# Patient Record
Sex: Female | Born: 1965 | Race: Black or African American | Hispanic: No | Marital: Single | Smoking: Never smoker
Health system: Southern US, Community
[De-identification: ages and names within clinical notes are randomized; demographics above are authoritative.]

## PROBLEM LIST (undated history)

## (undated) DIAGNOSIS — N3000 Acute cystitis without hematuria: Secondary | ICD-10-CM

## (undated) DIAGNOSIS — H65191 Other acute nonsuppurative otitis media, right ear: Secondary | ICD-10-CM

## (undated) DIAGNOSIS — Z930 Tracheostomy status: Secondary | ICD-10-CM

## (undated) DIAGNOSIS — I639 Cerebral infarction, unspecified: Secondary | ICD-10-CM

## (undated) DIAGNOSIS — H60393 Other infective otitis externa, bilateral: Secondary | ICD-10-CM

## (undated) DIAGNOSIS — I10 Essential (primary) hypertension: Secondary | ICD-10-CM

## (undated) DIAGNOSIS — R4182 Altered mental status, unspecified: Secondary | ICD-10-CM

## (undated) DIAGNOSIS — J9601 Acute respiratory failure with hypoxia: Secondary | ICD-10-CM

---

## 2000-01-12 ENCOUNTER — Other Ambulatory Visit: Admission: RE | Admit: 2000-01-12 | Discharge: 2000-01-12 | Payer: Self-pay | Admitting: Obstetrics and Gynecology

## 2000-08-11 ENCOUNTER — Inpatient Hospital Stay (HOSPITAL_COMMUNITY): Admission: AD | Admit: 2000-08-11 | Discharge: 2000-08-14 | Payer: Self-pay | Admitting: Obstetrics and Gynecology

## 2000-08-19 ENCOUNTER — Inpatient Hospital Stay (HOSPITAL_COMMUNITY): Admission: AD | Admit: 2000-08-19 | Discharge: 2000-08-19 | Payer: Self-pay | Admitting: *Deleted

## 2000-09-27 ENCOUNTER — Other Ambulatory Visit: Admission: RE | Admit: 2000-09-27 | Discharge: 2000-09-27 | Payer: Self-pay | Admitting: Obstetrics and Gynecology

## 2000-11-27 ENCOUNTER — Emergency Department (HOSPITAL_COMMUNITY): Admission: EM | Admit: 2000-11-27 | Discharge: 2000-11-27 | Payer: Self-pay | Admitting: Emergency Medicine

## 2000-11-27 ENCOUNTER — Encounter: Payer: Self-pay | Admitting: Emergency Medicine

## 2002-06-06 ENCOUNTER — Encounter: Payer: Self-pay | Admitting: Physical Medicine and Rehabilitation

## 2002-06-06 ENCOUNTER — Ambulatory Visit (HOSPITAL_COMMUNITY)
Admission: RE | Admit: 2002-06-06 | Discharge: 2002-06-06 | Payer: Self-pay | Admitting: Physical Medicine and Rehabilitation

## 2002-11-03 ENCOUNTER — Emergency Department (HOSPITAL_COMMUNITY): Admission: EM | Admit: 2002-11-03 | Discharge: 2002-11-03 | Payer: Self-pay | Admitting: Emergency Medicine

## 2002-11-08 ENCOUNTER — Emergency Department (HOSPITAL_COMMUNITY): Admission: EM | Admit: 2002-11-08 | Discharge: 2002-11-09 | Payer: Self-pay

## 2003-07-19 ENCOUNTER — Emergency Department (HOSPITAL_COMMUNITY): Admission: EM | Admit: 2003-07-19 | Discharge: 2003-07-19 | Payer: Self-pay | Admitting: Emergency Medicine

## 2004-05-16 ENCOUNTER — Inpatient Hospital Stay (HOSPITAL_COMMUNITY): Admission: AD | Admit: 2004-05-16 | Discharge: 2004-05-16 | Payer: Self-pay | Admitting: Obstetrics and Gynecology

## 2004-05-16 IMAGING — US US OB COMP LESS 14 WK
1 series · 12 of 12 positions shown · non-contrast
Comparison: none

CLINICAL DATA: 38-year-old female with positive pregnancy test and pelvic pain.

[Series 1: us ob comp less 14 wk · 0.29mm/px · 12 of 12 slices shown]
[im 1/12]
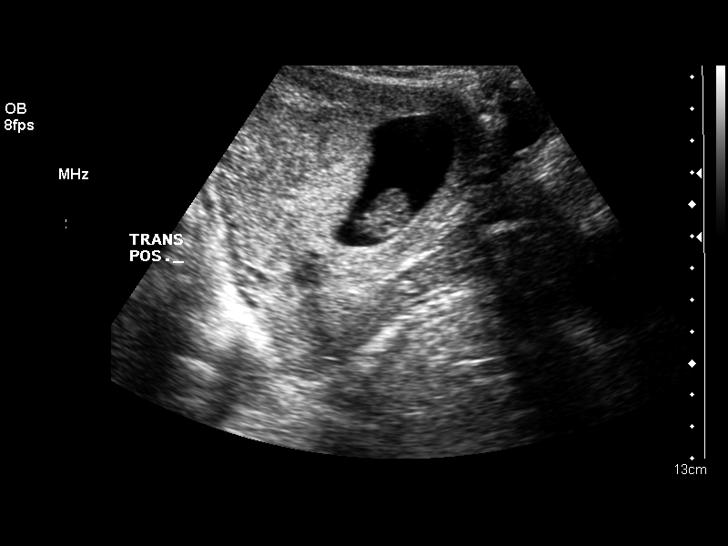
[im 2/12]
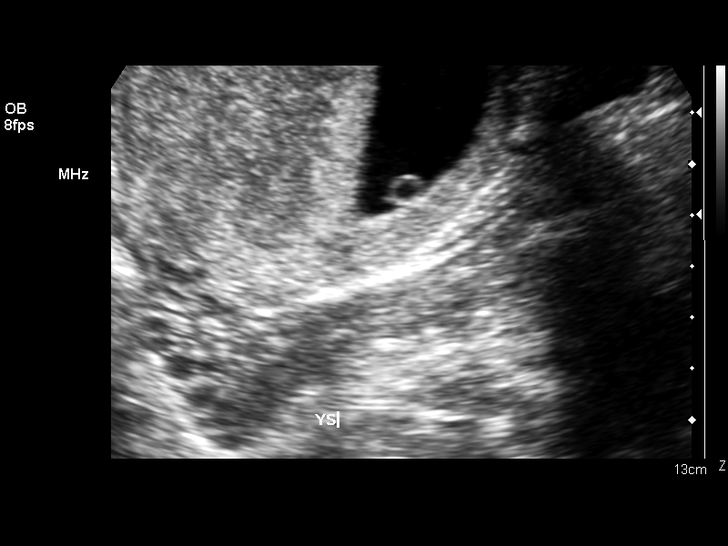
[im 3/12]
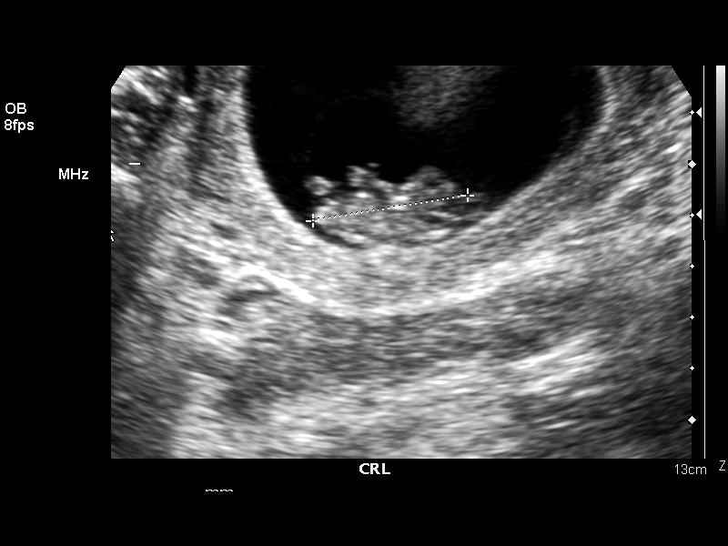
[im 4/12]
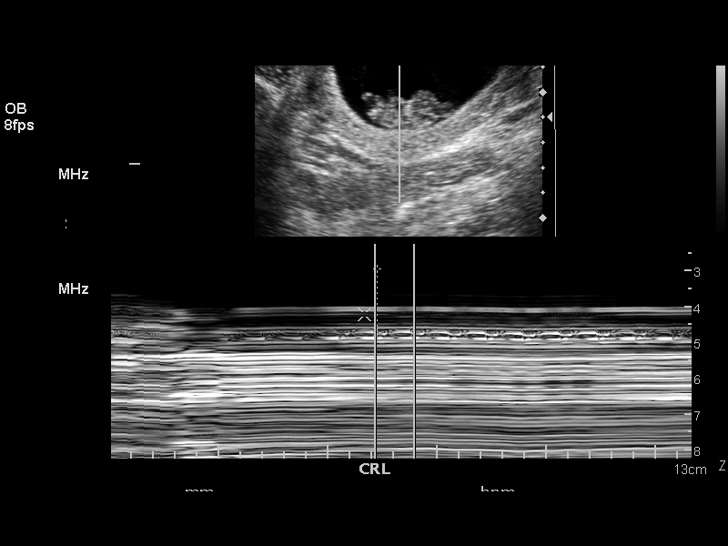
[im 5/12]
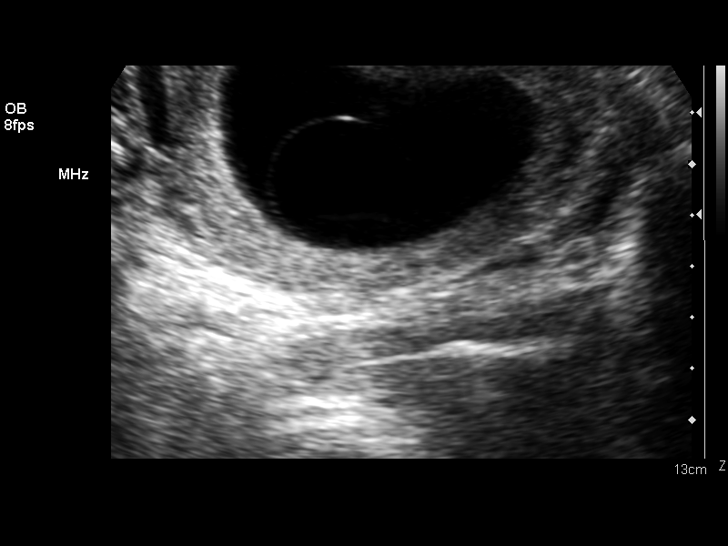
[im 6/12]
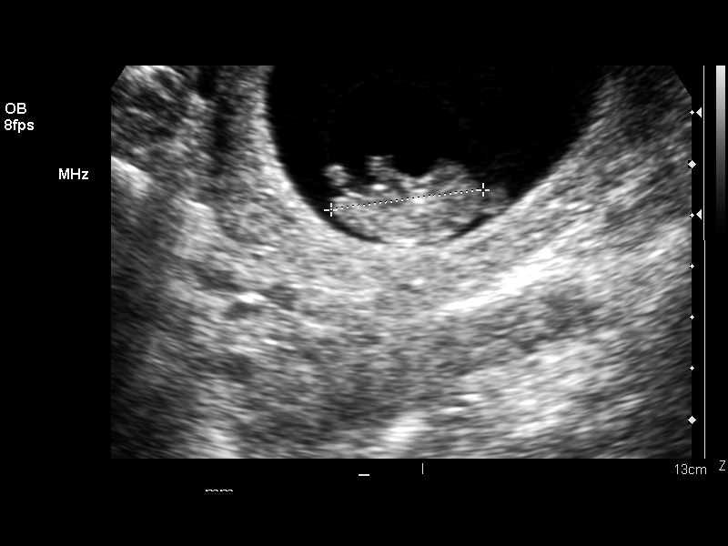
[im 7/12]
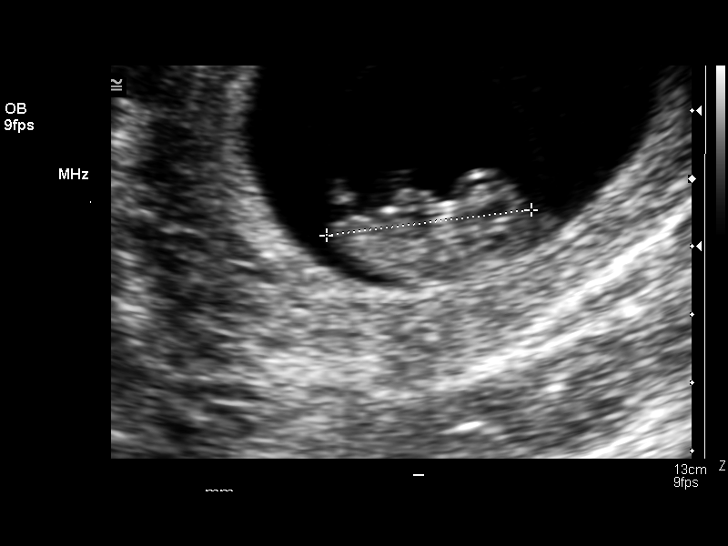
[im 8/12]
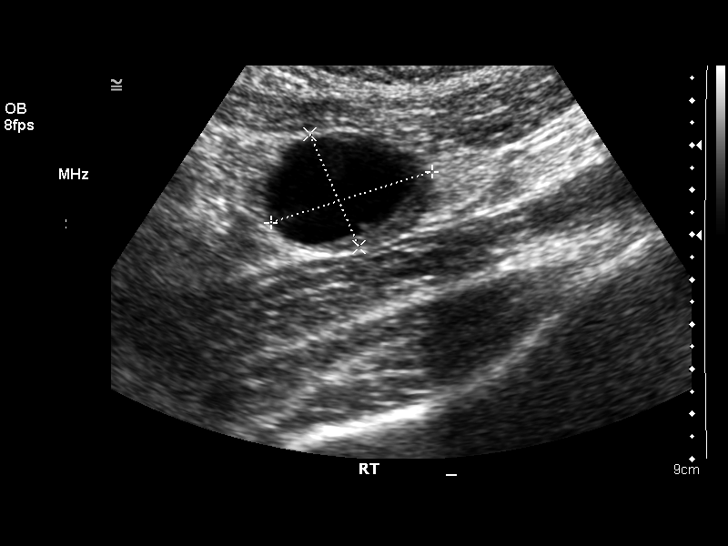
[im 9/12]
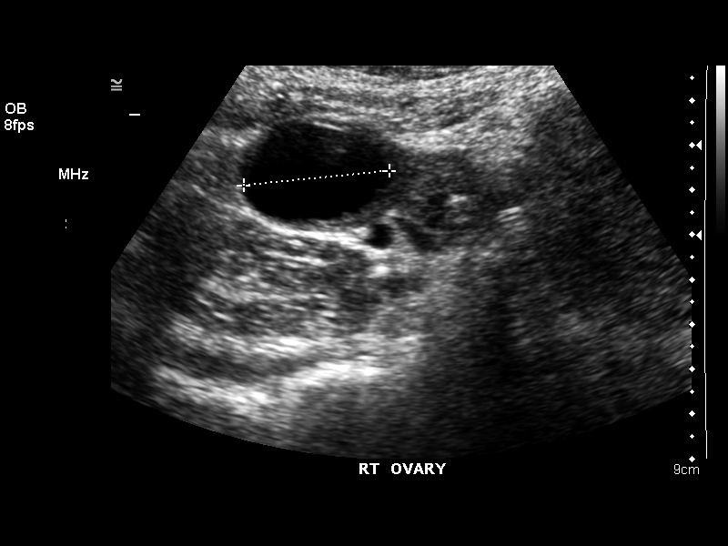
[im 10/12]
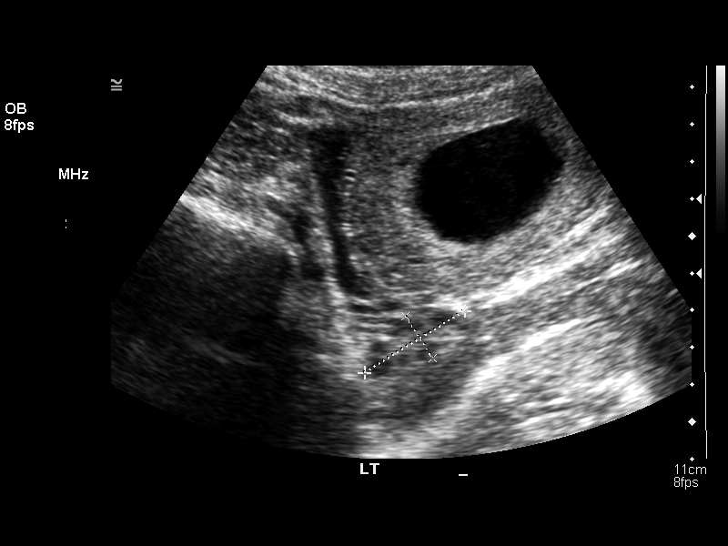
[im 11/12]
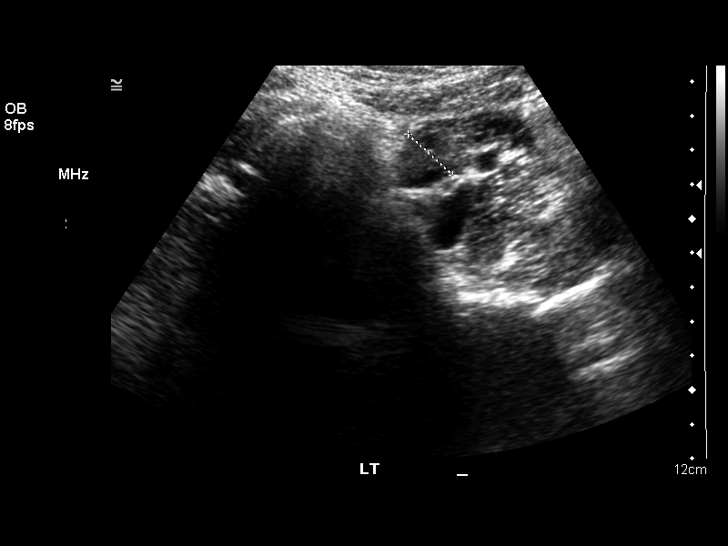
[im 12/12]
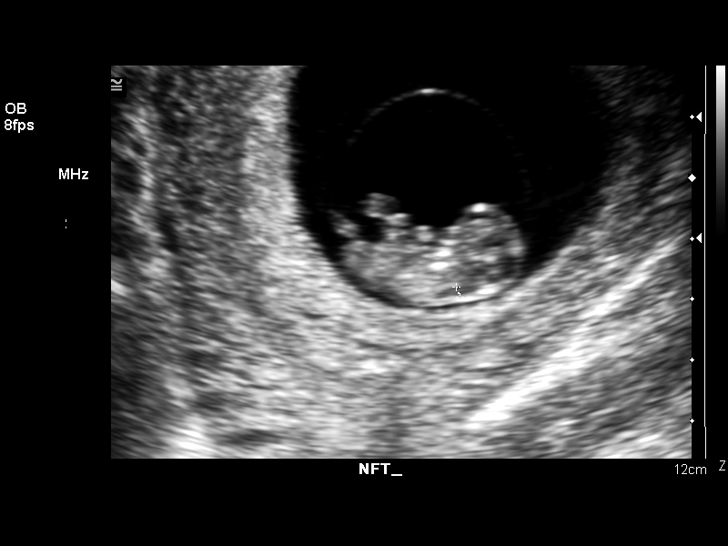

[12 of 12 positions shown; findings below may reference images not displayed]

OBSTETRICAL ULTRASOUND:

 Number of Fetuses:  1
 Heart Rate:  168
 Presentation:  Transverse
 Amniotic fluid:  Normal
 CRL:  3.0 cm  9 w 6 d
 Ultrasound EDC:  [DATE]

 Fetal anatomy could not be evaluated due to the early gestational age.  Yolk sac visualized.  

 MATERNAL FINDINGS
 Cervix not evaluated.  3.0 cm corpus luteum right ovary.
IMPRESSION: Single living intrauterine gestation with estimated gestational age of 9 weeks 6 days.  
 Right corpus luteum.
 No evidence of subchorionic hemorrhage.

## 2004-07-24 ENCOUNTER — Inpatient Hospital Stay (HOSPITAL_COMMUNITY): Admission: AD | Admit: 2004-07-24 | Discharge: 2004-07-24 | Payer: Self-pay | Admitting: Obstetrics and Gynecology

## 2004-07-24 IMAGING — US US OB COMP +14 WK
1 series · 18 of 28 positions shown · non-contrast
Comparison: none

CLINICAL DATA: Vaginal bleeding at 19 weeks and 5 days of pregnancy.

[Series 1: us ob comp +14 wk · 18 of 69 slices shown]
[im 1/69]
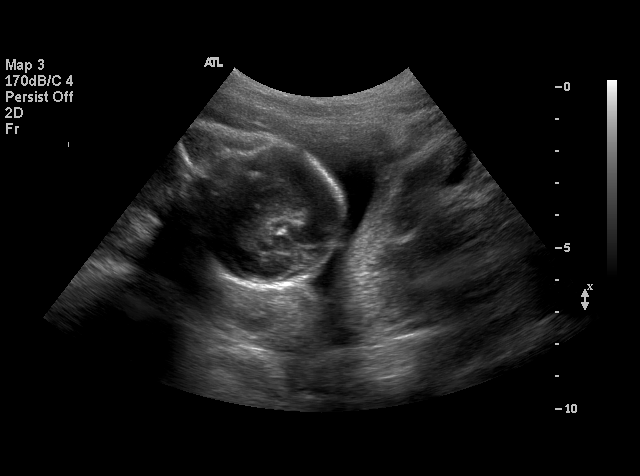
[im 6/69]
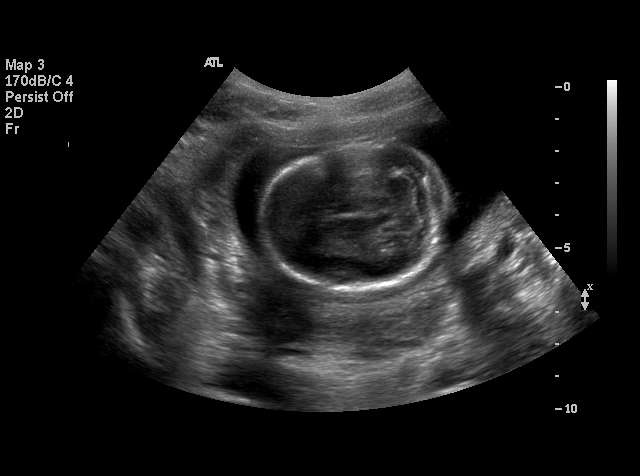
[im 8/69]
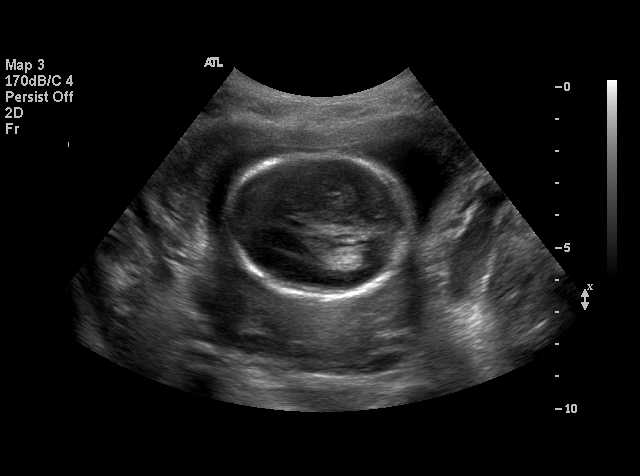
[im 13/69]
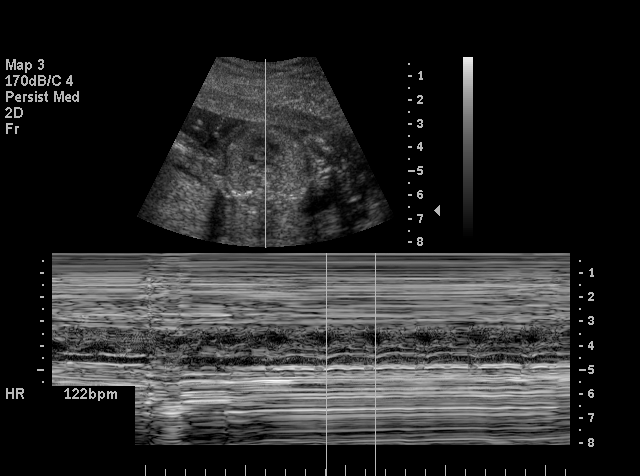
[im 18/69]
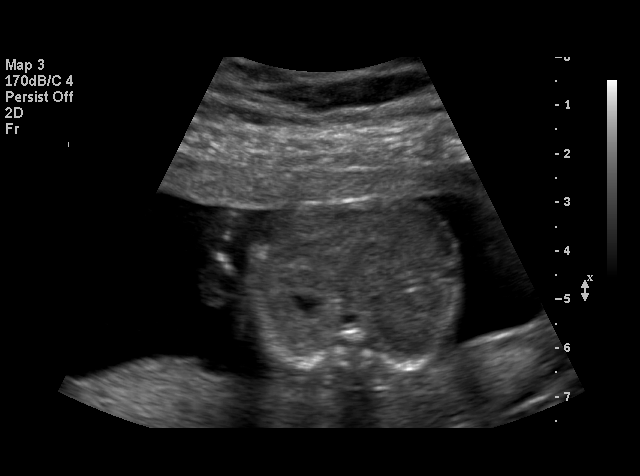
[im 21/69]
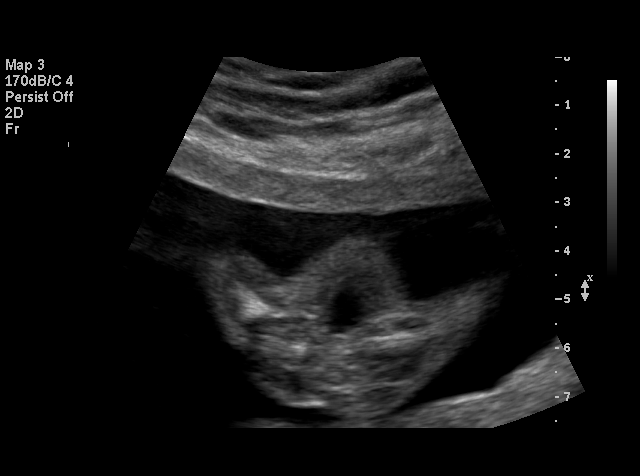
[im 26/69]
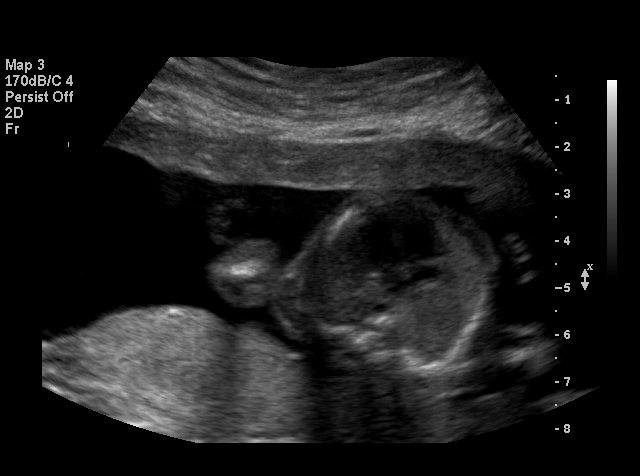
[im 28/69]
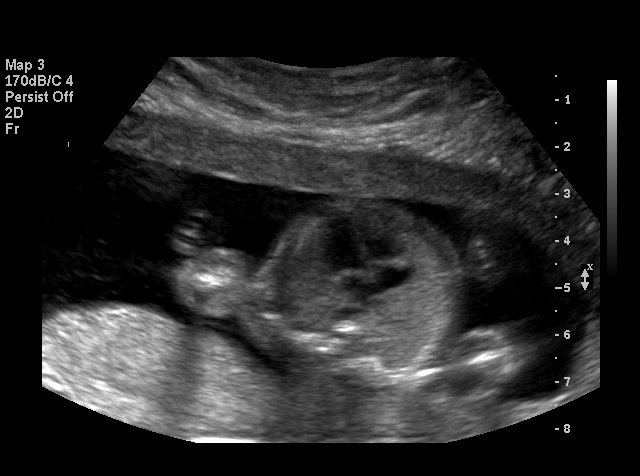
[im 33/69]
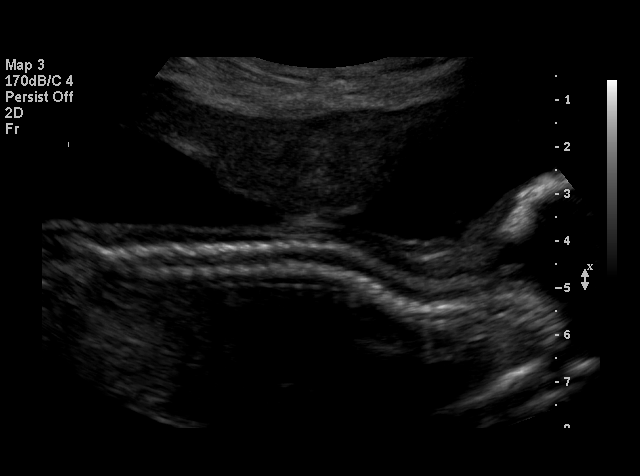
[im 36/69]
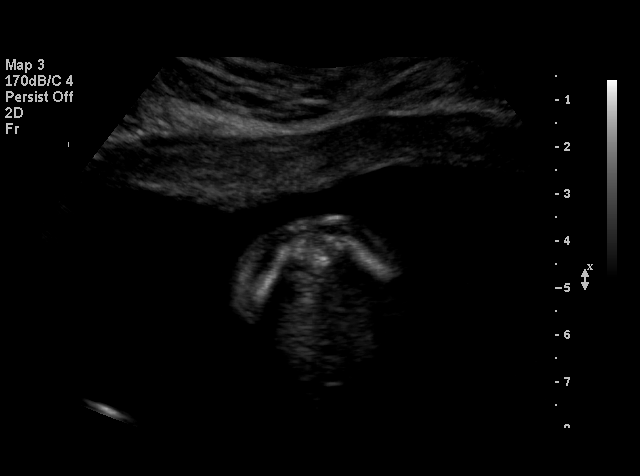
[im 41/69]
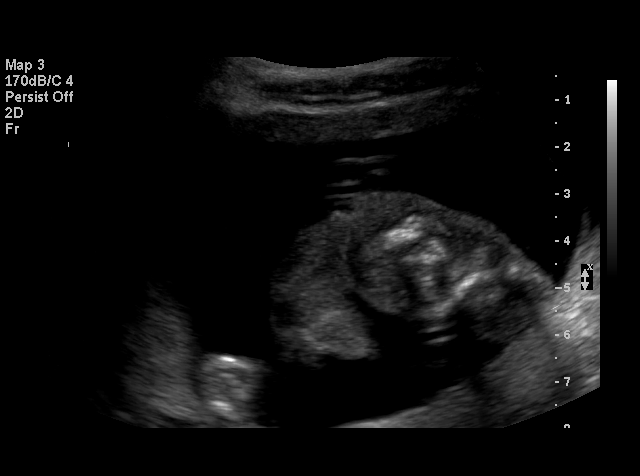
[im 43/69]
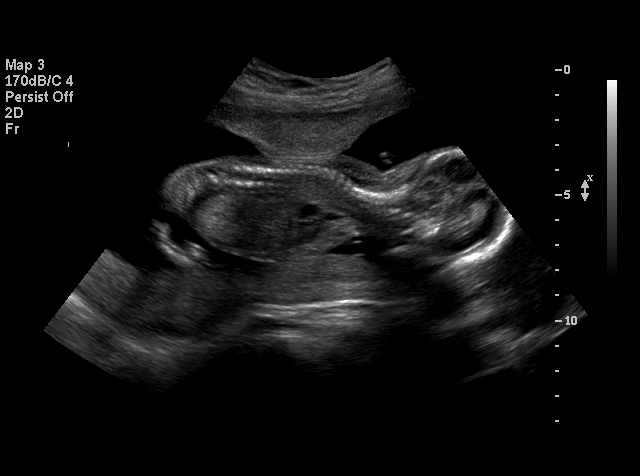
[im 48/69]
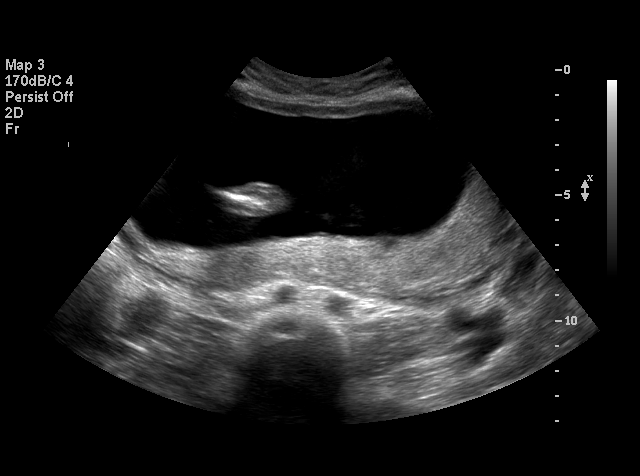
[im 53/69]
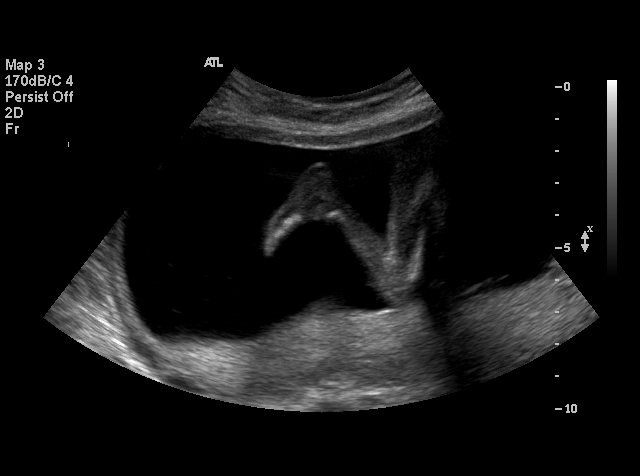
[im 56/69]
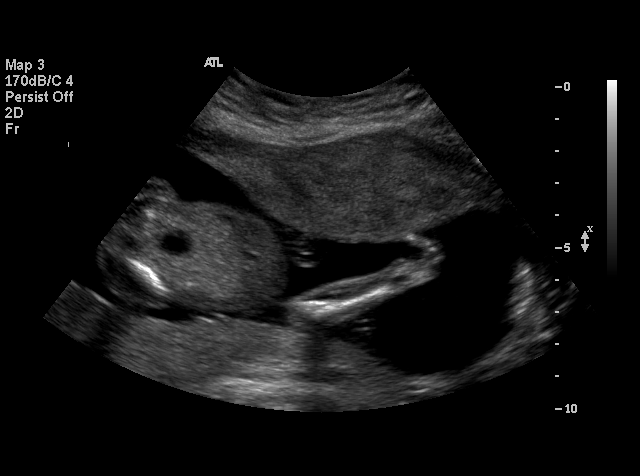
[im 61/69]
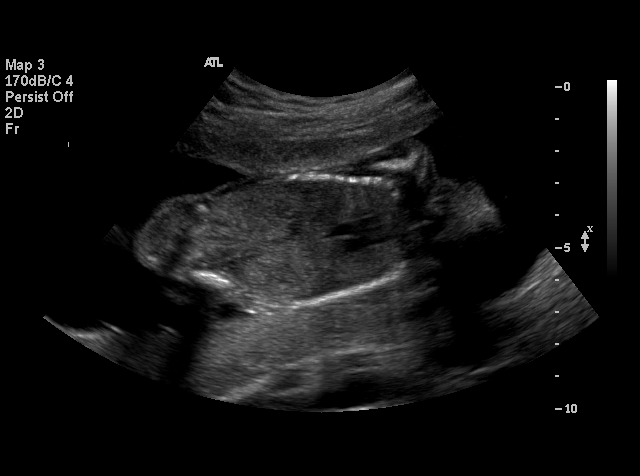
[im 63/69]
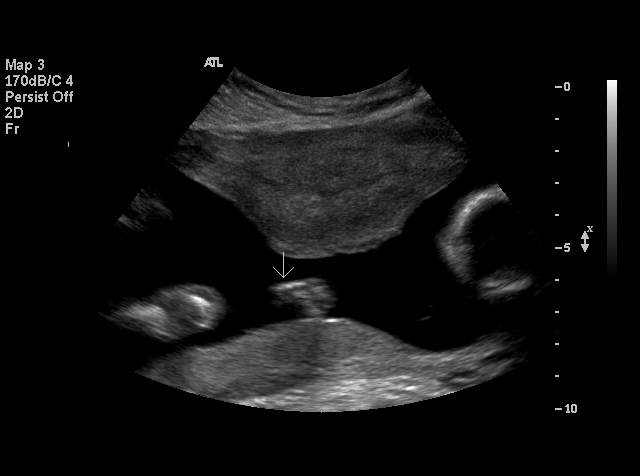
[im 69/69]
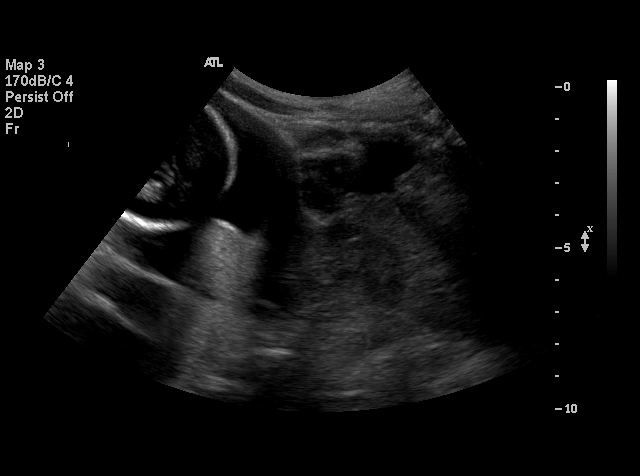

[18 of 28 positions shown; findings below may reference images not displayed]

OBSTETRICAL ULTRASOUND:
Number of Fetuses:  1
Heart Rate:  122
Movement:  Yes
Breathing:    Yes  
Presentation:  Cephalic 
Placental Location:  Posterior
Grade:  I
Previa:  No
Amniotic Fluid (Subjective):  Normal
Amniotic Fluid (Objective):   6.1 cm Vertical pocket 

FETAL BIOMETRY
BPD:  4.4 cm  19 w 2 d
HC:  16.9 cm   19 w 5 d
AC:  13.6 cm   19 w 1 d
FL:    3.2 cm   20 w 0 d

MEAN GA:  19 w 4d

FETAL ANATOMY
Lateral Ventricles:    Visualized 
Thalami/CSP:      Visualized 
Posterior Fossa:  Visualized 
Nuchal Region:    N/A
Spine:      Visualized 
4 Chamber Heart on Left:      Visualized 
Stomach on Left:      Visualized 
3 Vessel Cord:    Visualized 
Cord Insertion site:    Visualized 
Kidneys:  Visualized 
Bladder:  Visualized 
Extremities:      Visualized 

ADDITIONAL ANATOMY VISUALIZED:  LVOT, RVOT, upper lip, orbits, diaphragm, heel, 5th digit, ductal arch, aortic arch, and male genitalia.  

MATERNAL UTERINE AND ADNEXAL FINDINGS
Cervix:   3.3 cm Transabdominally
IMPRESSION: Single normal appearing live intrauterine gestation in a cephalic presentation with an estimated gestational age by today?s measurements of 19 weeks and 4 days.  This represents normal growth since the previous examination dated [DATE].  No abnormality is seen.

</u12:p>

## 2004-12-12 ENCOUNTER — Inpatient Hospital Stay (HOSPITAL_COMMUNITY): Admission: AD | Admit: 2004-12-12 | Discharge: 2004-12-12 | Payer: Self-pay | Admitting: Obstetrics & Gynecology

## 2004-12-14 ENCOUNTER — Observation Stay (HOSPITAL_COMMUNITY): Admission: AD | Admit: 2004-12-14 | Discharge: 2004-12-14 | Payer: Self-pay | Admitting: Obstetrics and Gynecology

## 2004-12-15 ENCOUNTER — Inpatient Hospital Stay (HOSPITAL_COMMUNITY): Admission: AD | Admit: 2004-12-15 | Discharge: 2004-12-18 | Payer: Self-pay | Admitting: Obstetrics and Gynecology

## 2004-12-15 ENCOUNTER — Encounter (INDEPENDENT_AMBULATORY_CARE_PROVIDER_SITE_OTHER): Payer: Self-pay | Admitting: *Deleted

## 2004-12-15 ENCOUNTER — Inpatient Hospital Stay (HOSPITAL_COMMUNITY): Admission: AD | Admit: 2004-12-15 | Discharge: 2004-12-15 | Payer: Self-pay | Admitting: Obstetrics and Gynecology
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
## 2005-04-24 ENCOUNTER — Emergency Department (HOSPITAL_COMMUNITY): Admission: EM | Admit: 2005-04-24 | Discharge: 2005-04-24 | Payer: Self-pay | Admitting: Emergency Medicine
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
## 2005-08-13 ENCOUNTER — Emergency Department (HOSPITAL_COMMUNITY): Admission: EM | Admit: 2005-08-13 | Discharge: 2005-08-13 | Payer: Self-pay | Admitting: Emergency Medicine

## 2005-08-13 IMAGING — CR DG SHOULDER 2+V*L*
3 series · 3 of 3 positions shown · non-contrast
Comparison: none

CLINICAL DATA: Left shoulder pain, decreased range of motion.
 LEFT SHOULDER ? 3 VIEWS ? [DATE]:

[view not recorded (1 of 3)]
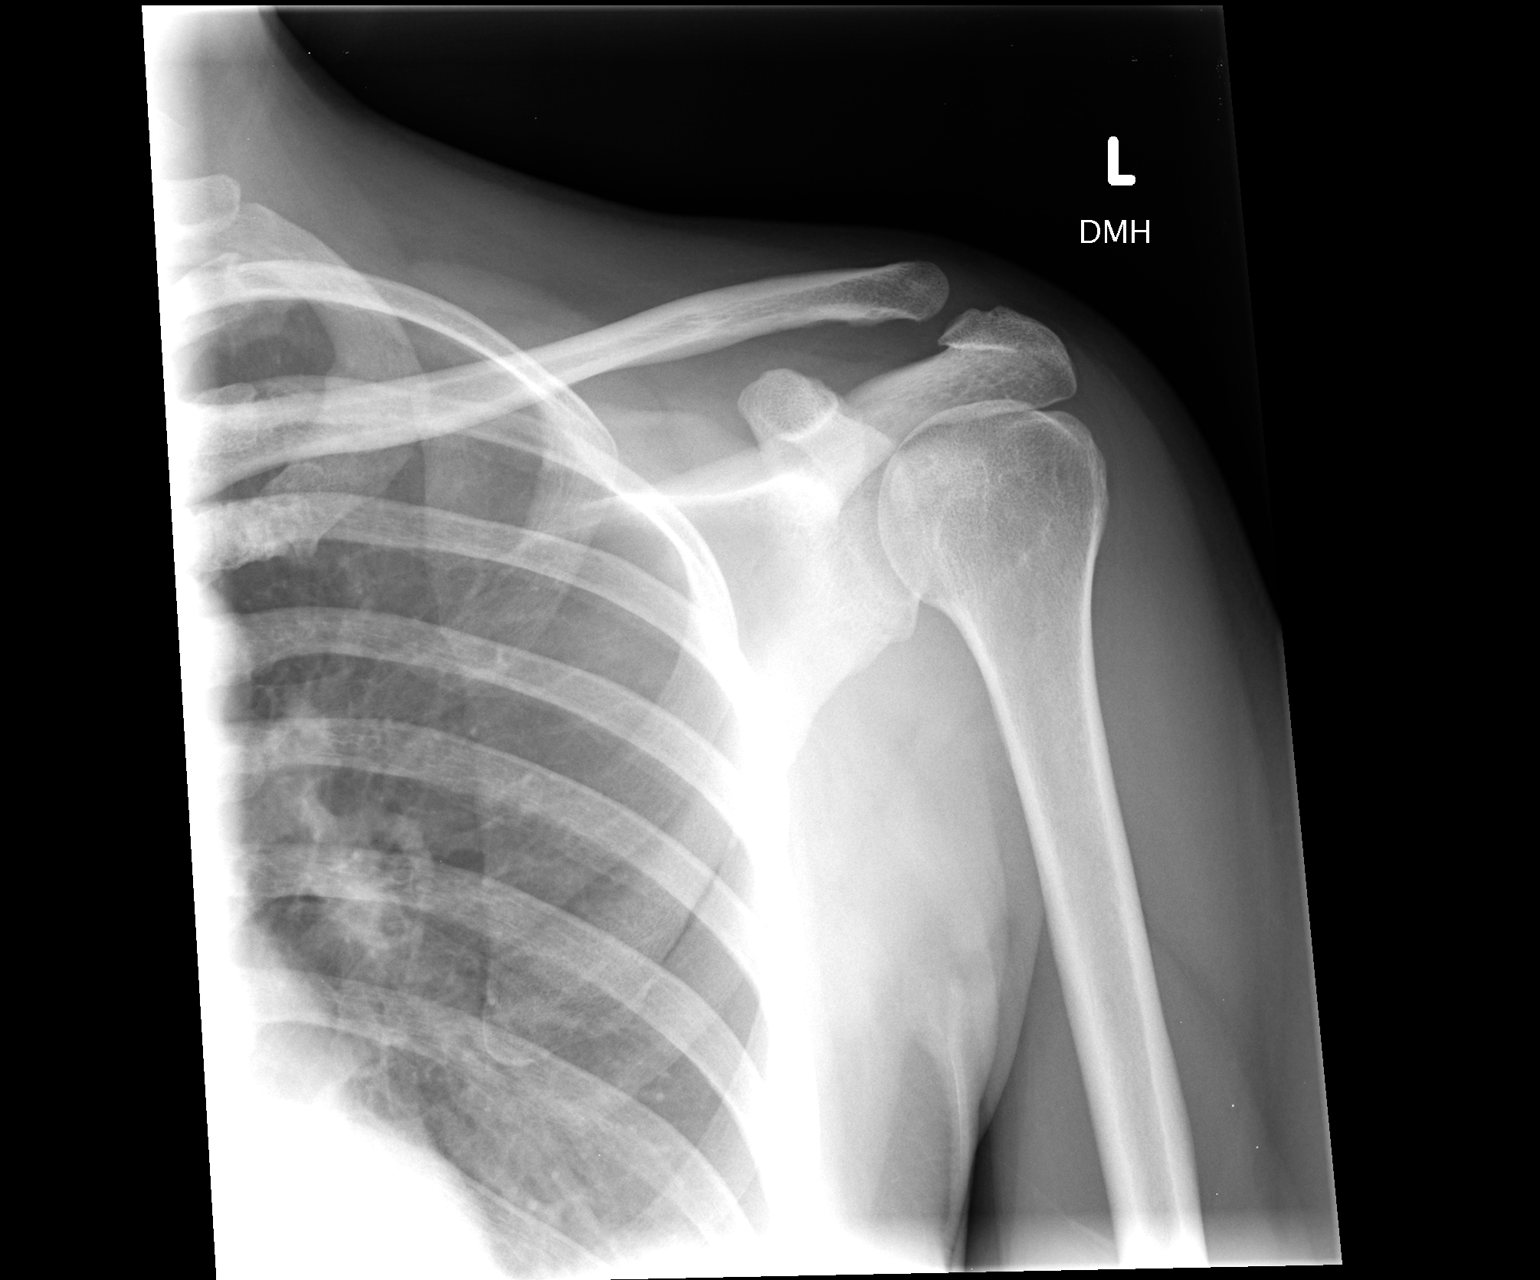

[view not recorded (2 of 3)]
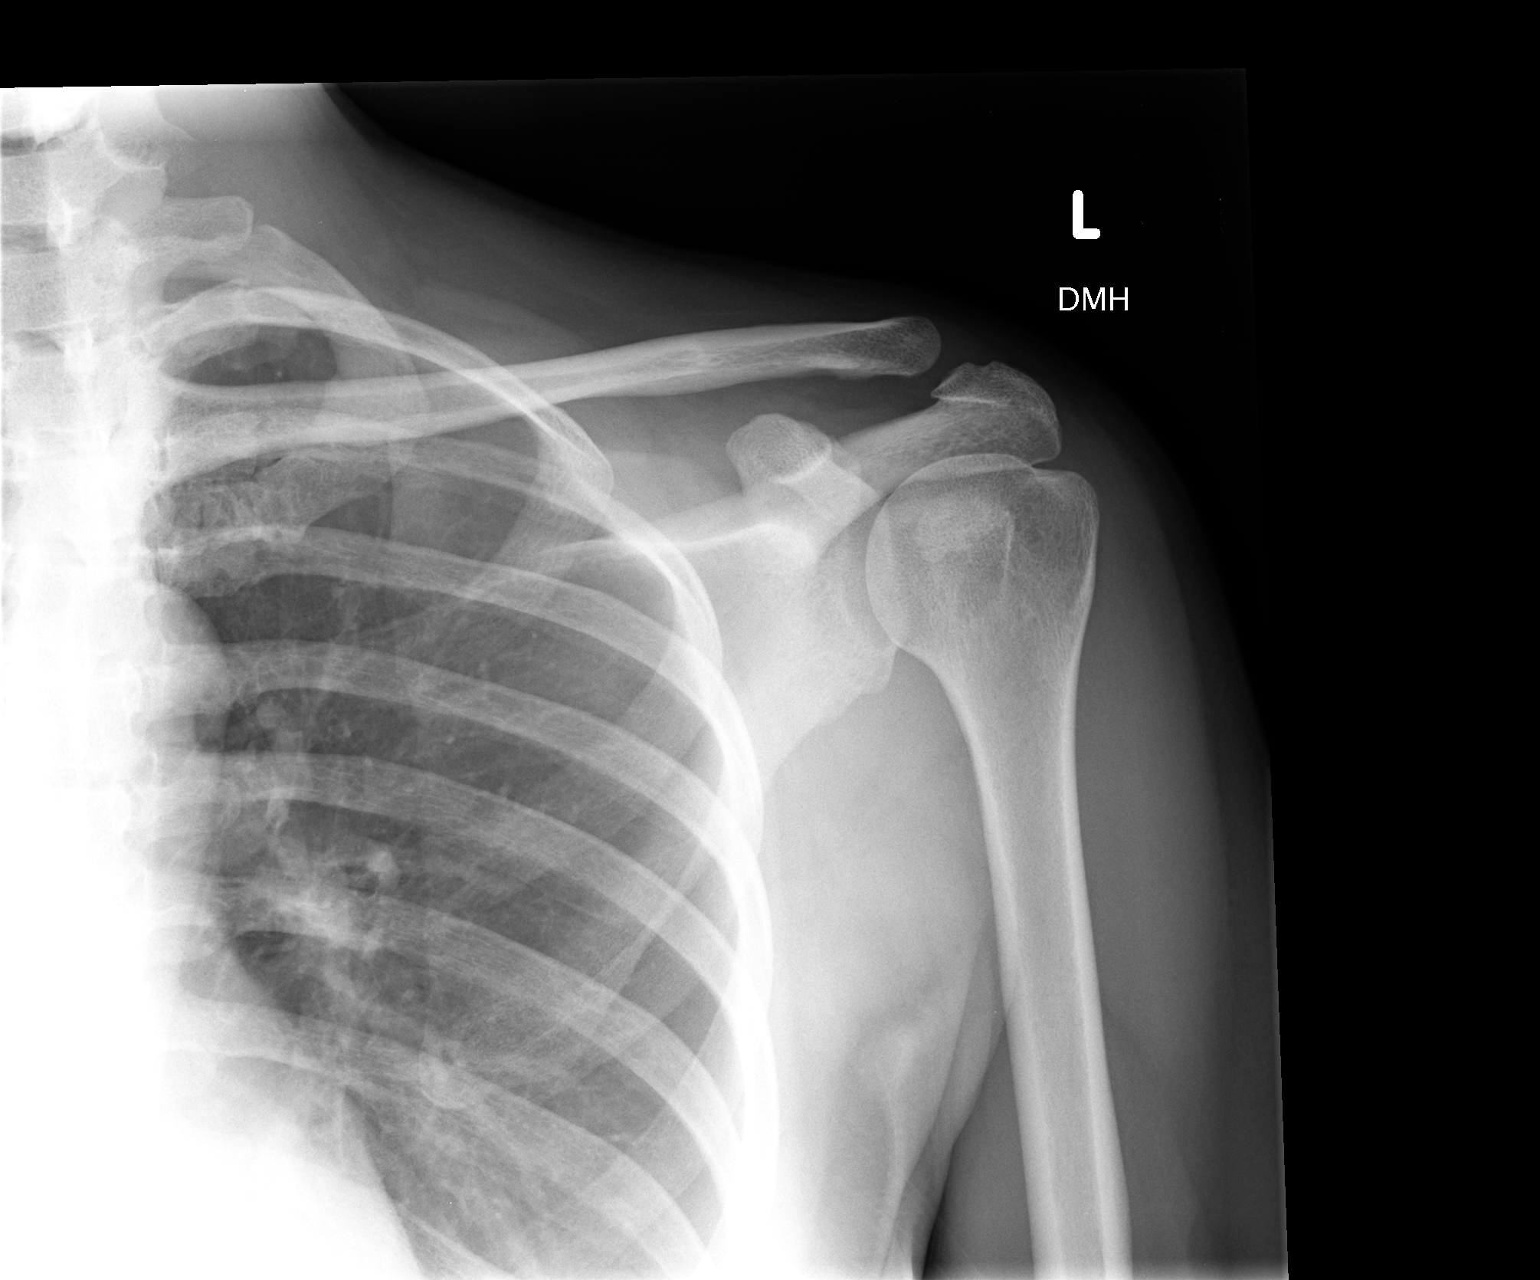

[view not recorded (3 of 3)]
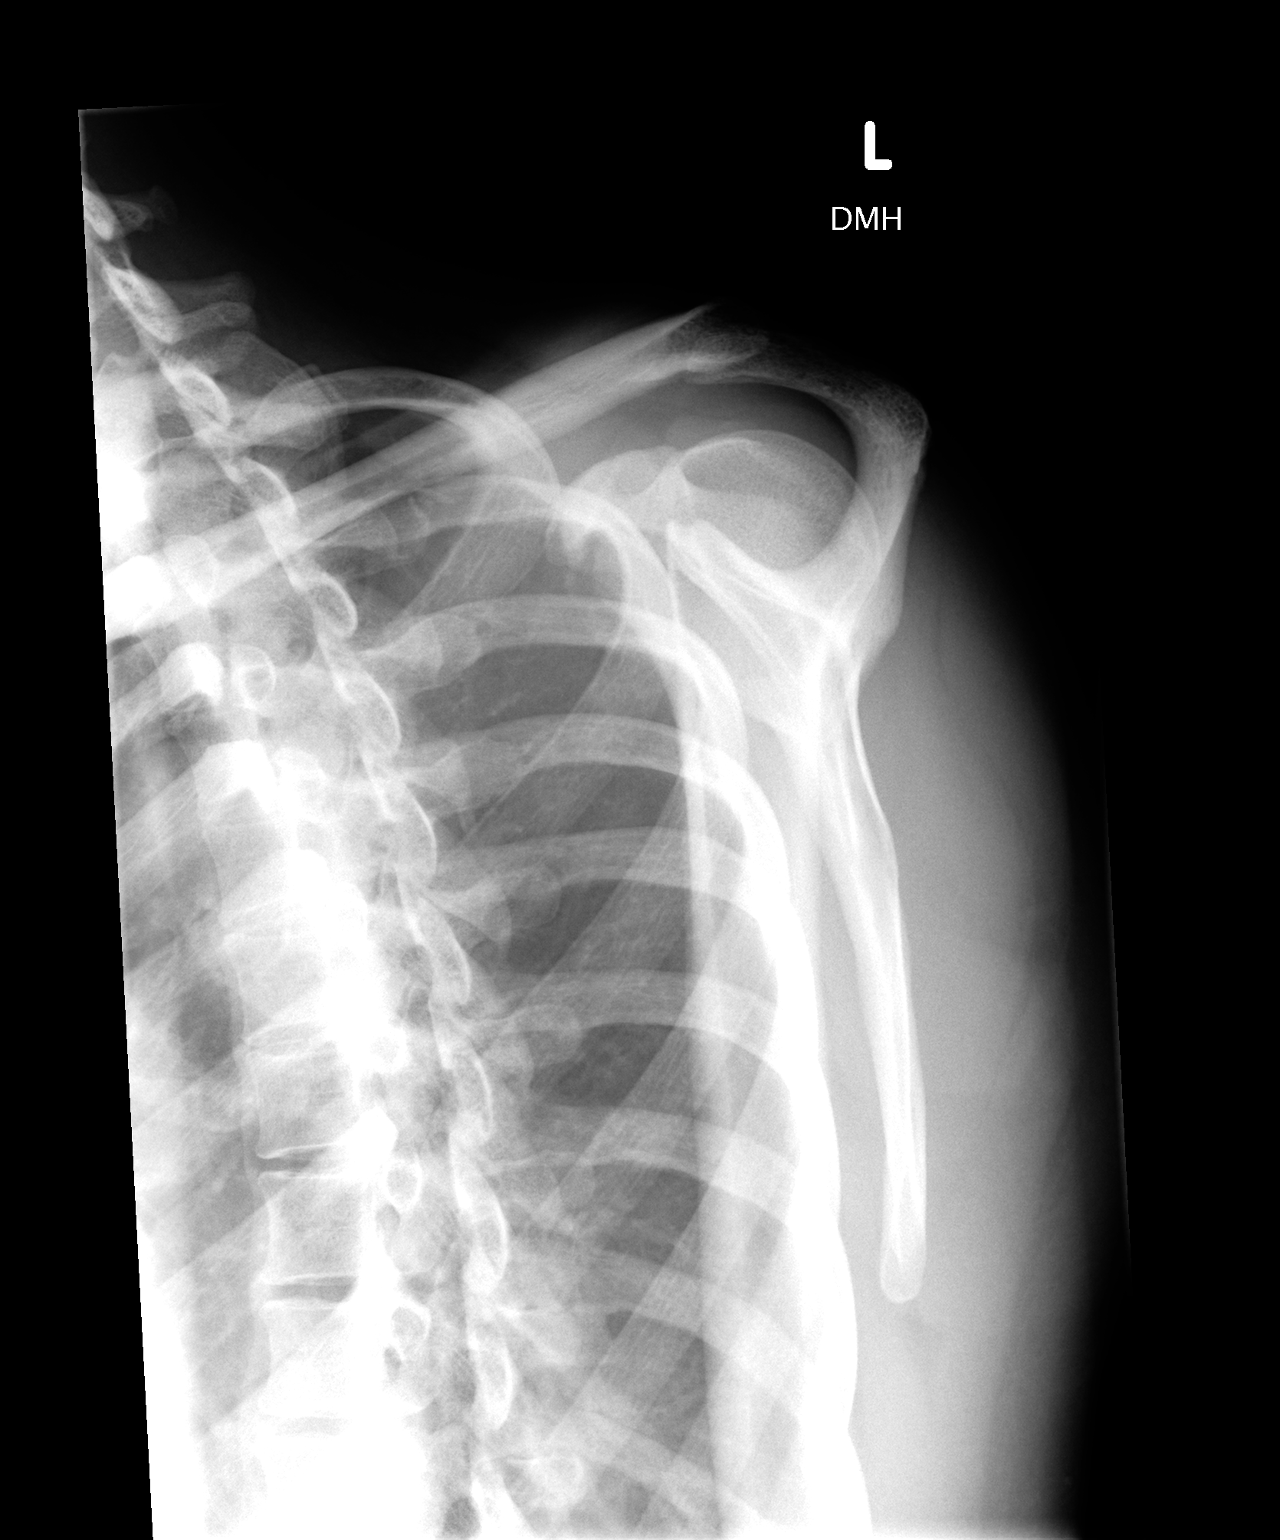

[3 of 3 positions shown; findings below may reference images not displayed]

FINDINGS: There is no evidence of fracture or dislocation. No other soft tissue or bone abnormalities are identified.
IMPRESSION: Negative.

## 2005-08-13 IMAGING — CR DG HUMERUS 2V *L*
2 series · 2 of 2 positions shown · non-contrast
Comparison: none

CLINICAL DATA: Left arm pain.
 LEFT HUMERUS ? 2 VIEW:

[view not recorded (1 of 2)]
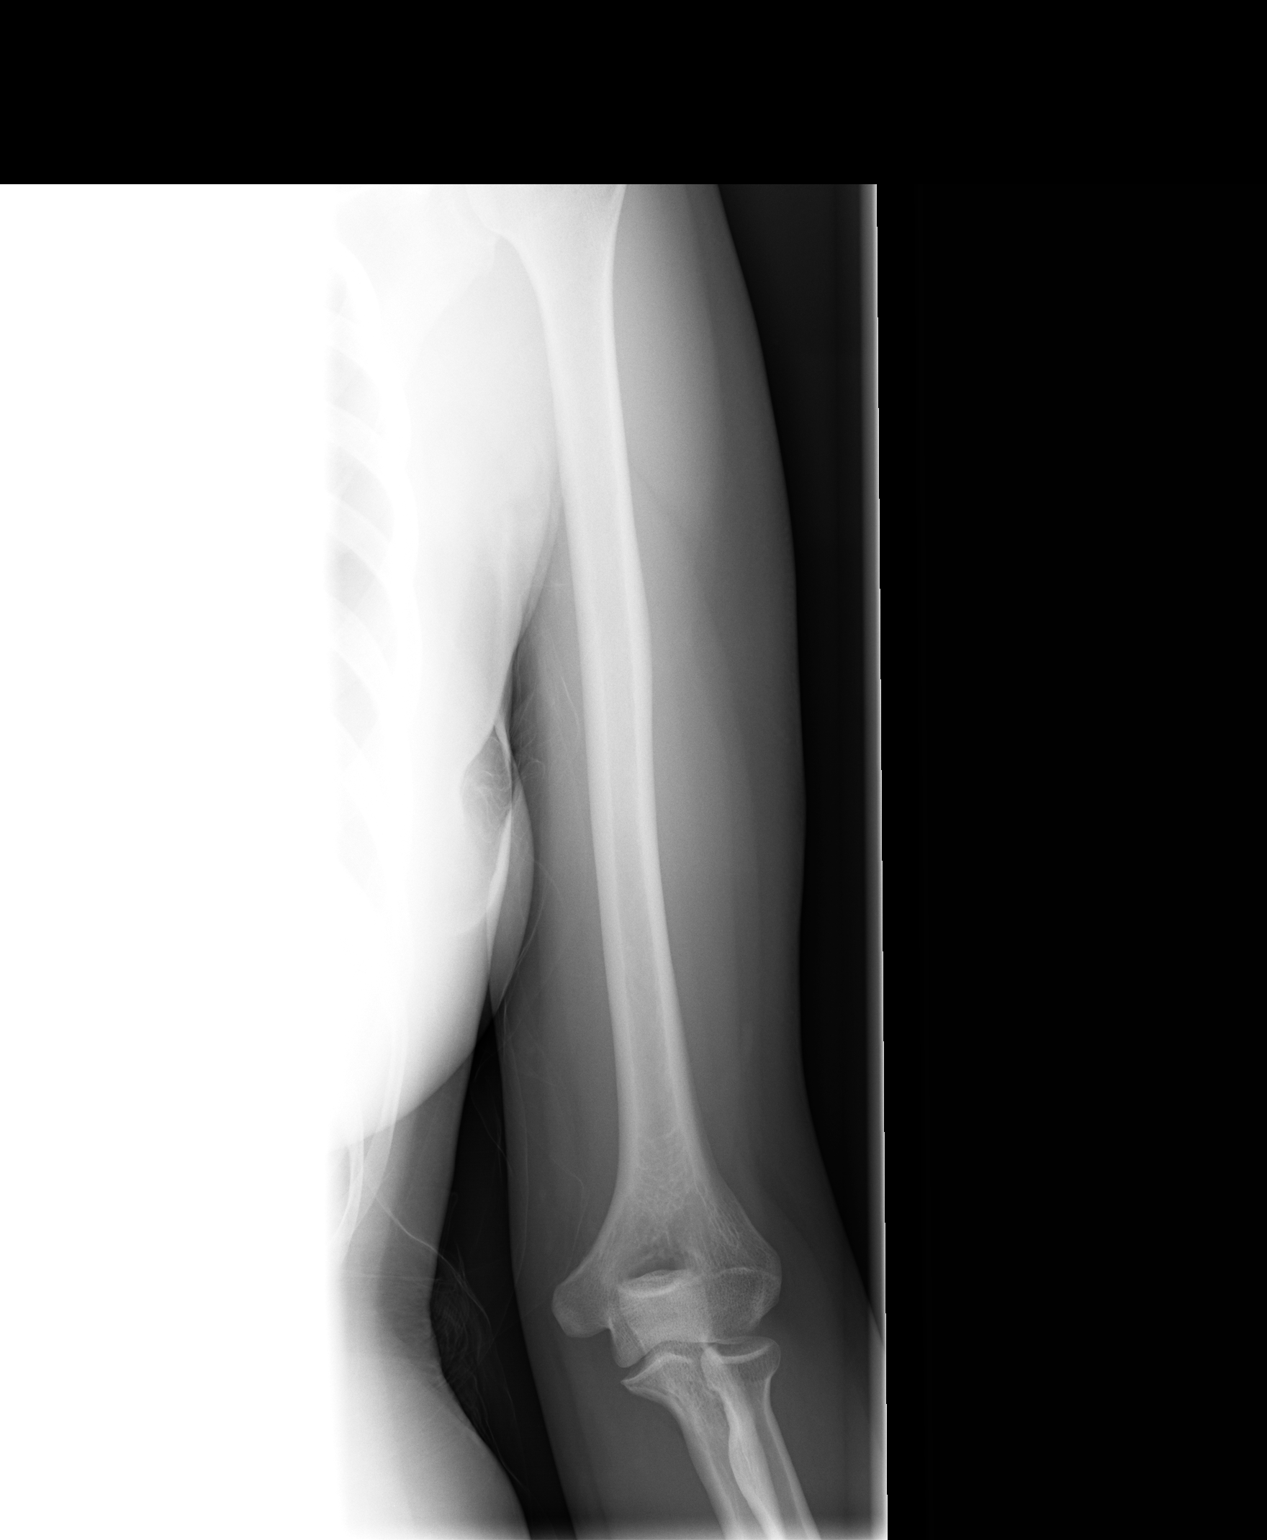

[view not recorded (2 of 2)]
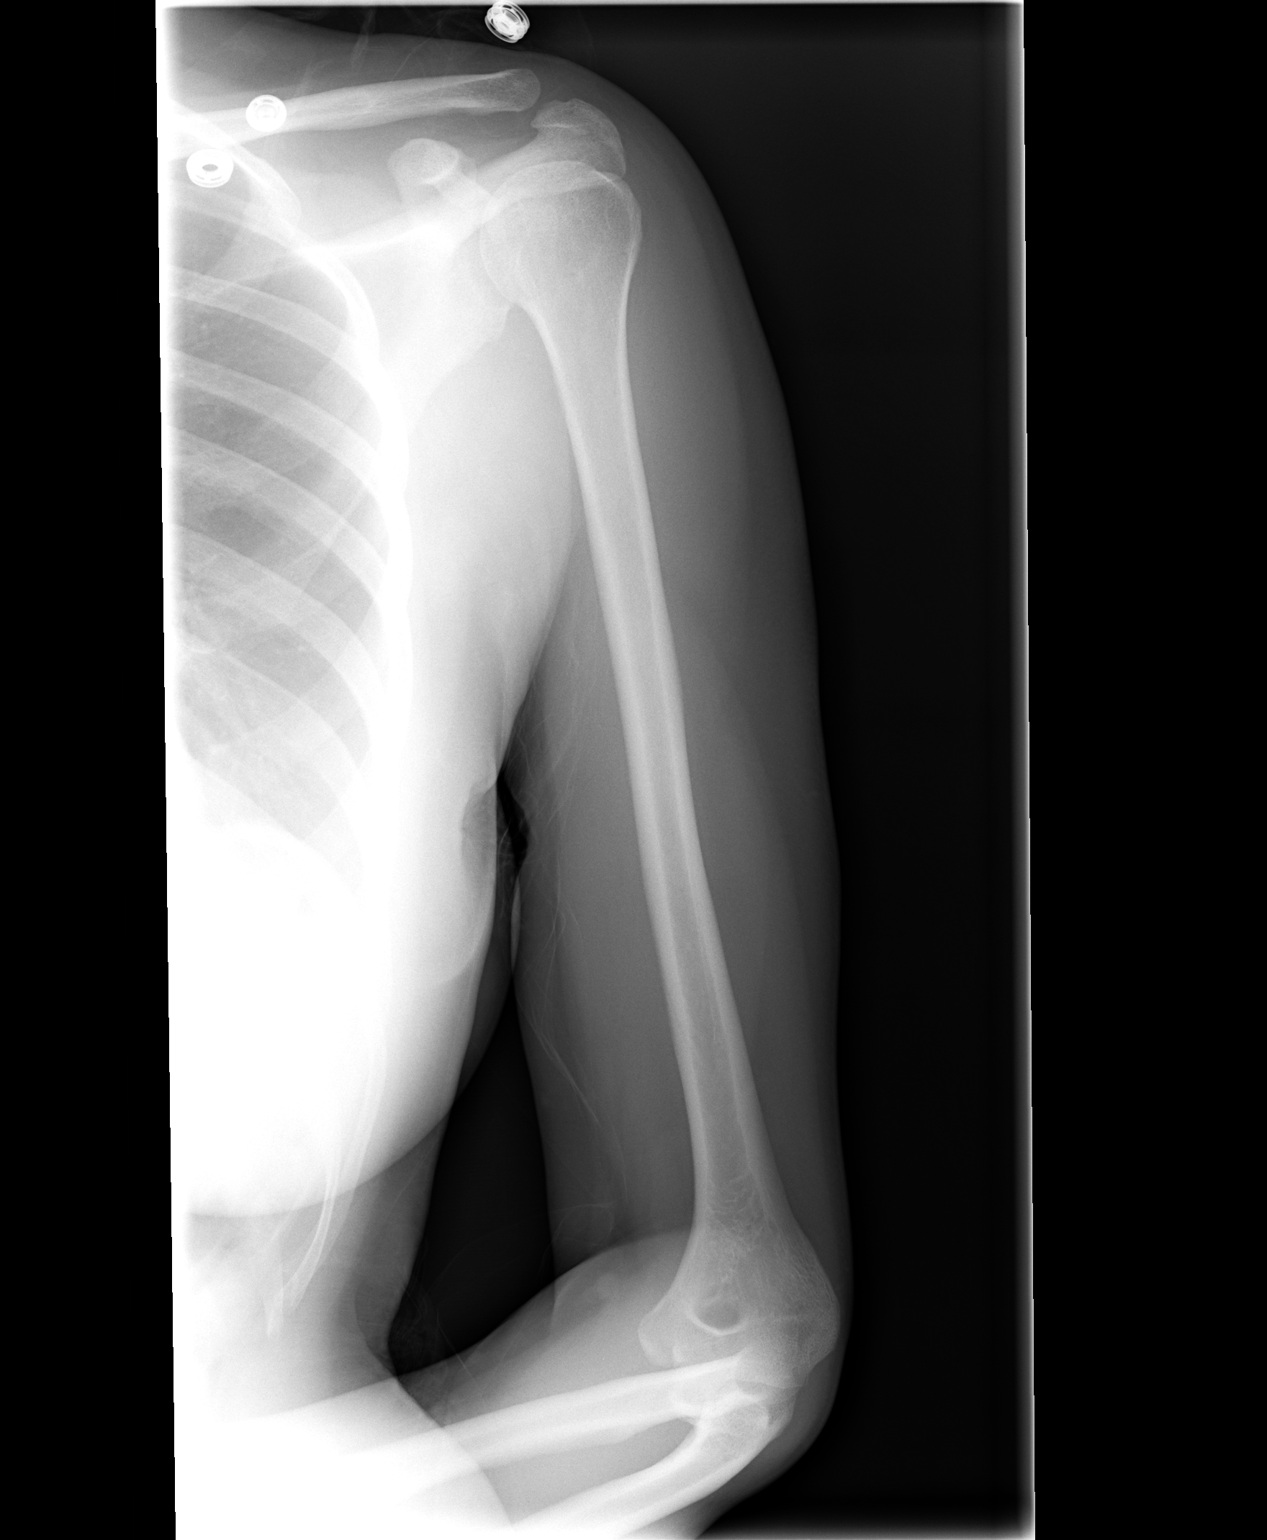

[2 of 2 positions shown; findings below may reference images not displayed]

FINDINGS: There is no evidence of fracture or other focal bone lesions.  Soft tissues are unremarkable.
IMPRESSION: Negative.

## 2006-04-04 ENCOUNTER — Inpatient Hospital Stay (HOSPITAL_COMMUNITY): Admission: AD | Admit: 2006-04-04 | Discharge: 2006-04-04 | Payer: Self-pay | Admitting: Obstetrics and Gynecology

## 2006-04-04 IMAGING — US US OB TRANSVAGINAL MODIFY
1 series · 14 of 28 positions shown · non-contrast
Comparison: No prior exams available.

CLINICAL DATA: 40-year-old with spotting and cramping off and on for one week.  Bleeding.  LMP [DATE].
 OBSTETRICAL ULTRASOUND <14 WKS AND TRANSVAGINAL OB US:
TECHNIQUE: Both transabdominal and transvaginal ultrasound examinations were performed for complete evaluation of the gestation as well as the maternal uterus, adnexal regions, and pelvic cul-de-sac.

[Series 1: us ob transvaginal modify · 0.29mm/px · 31 acquisitions, 14 frames shown]
[im 2/31]
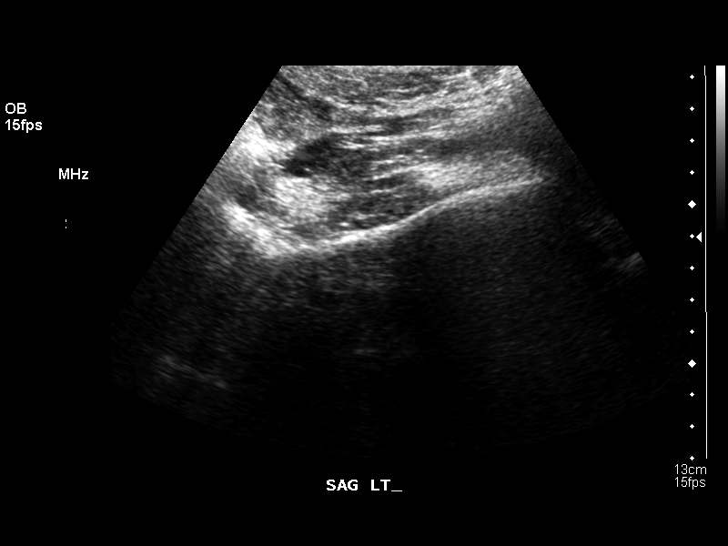
[im 4/31]
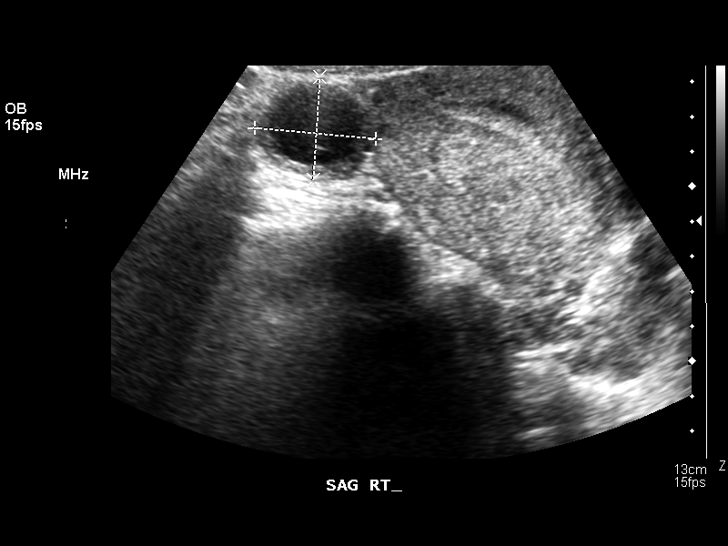
[im 6/31]
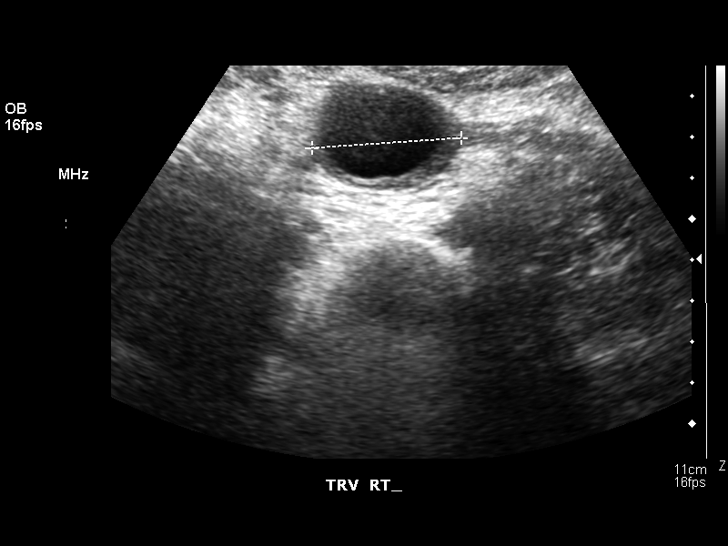
[im 8/31]
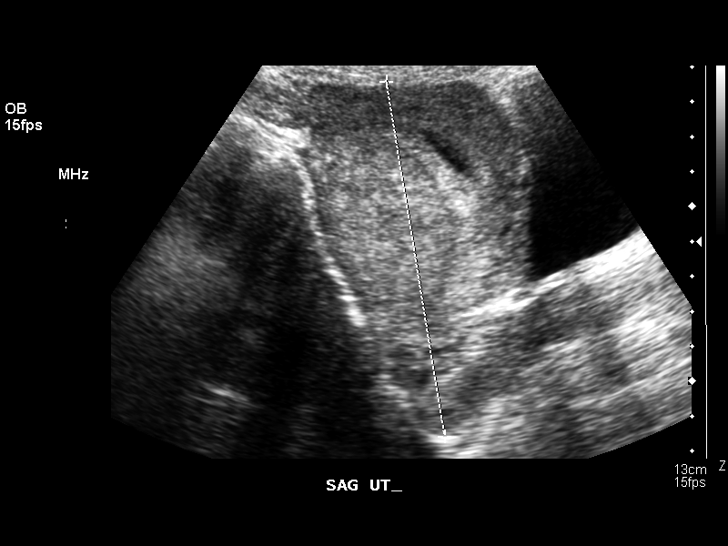
[im 11/31]
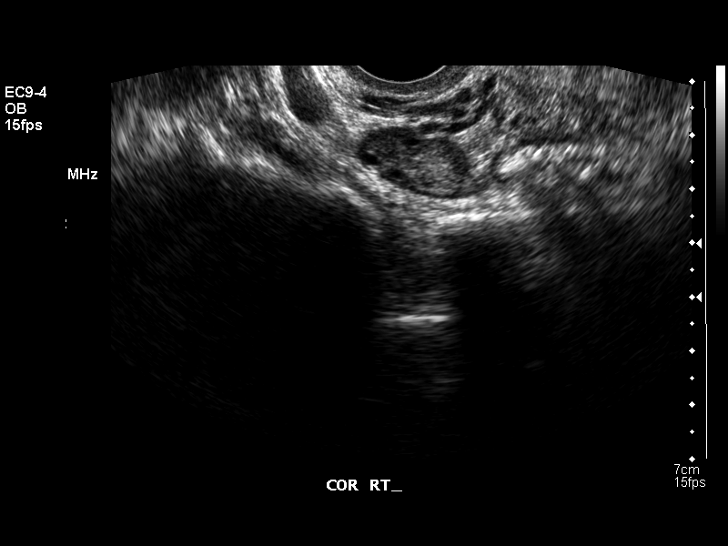
[im 13/31]
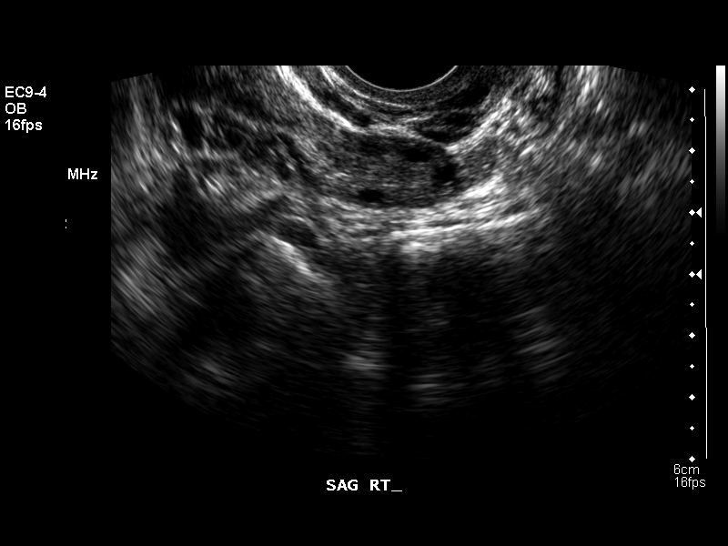
[im 15/31]
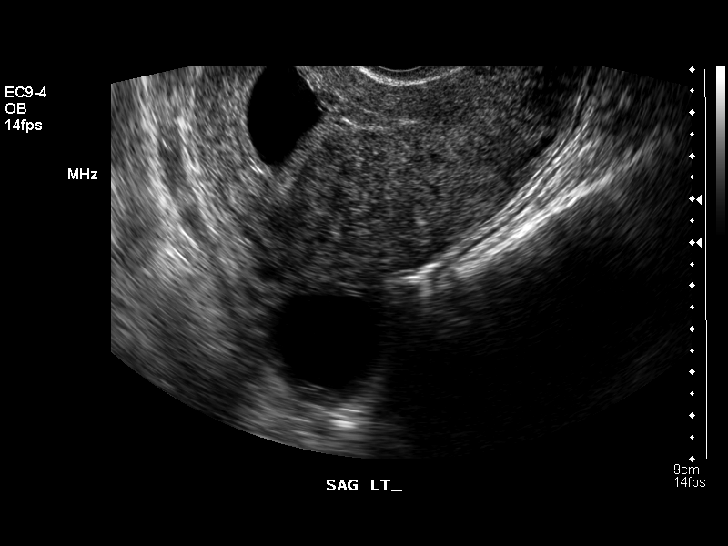
[im 17/31]
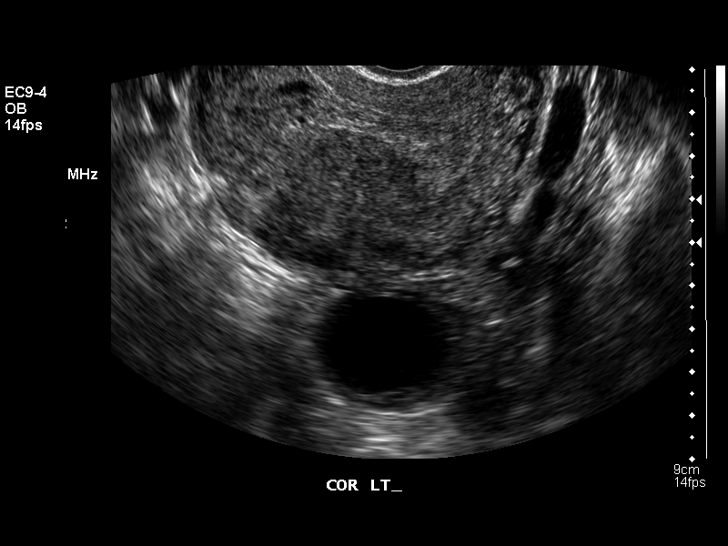
[im 19/31]
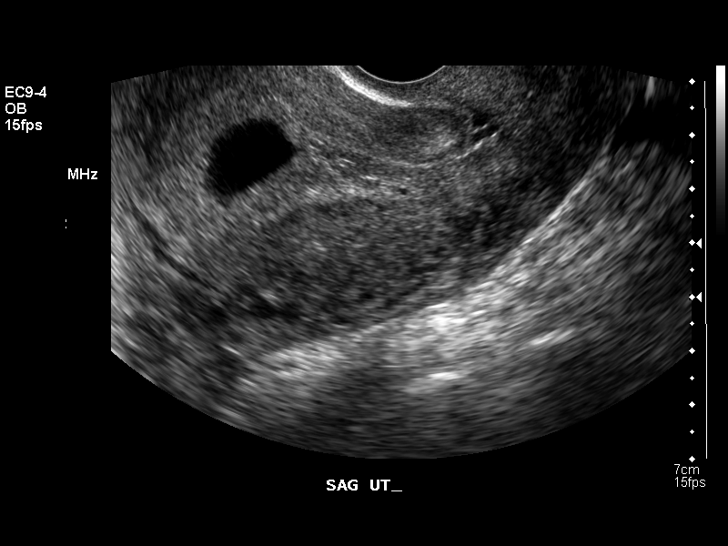
[im 22/31]
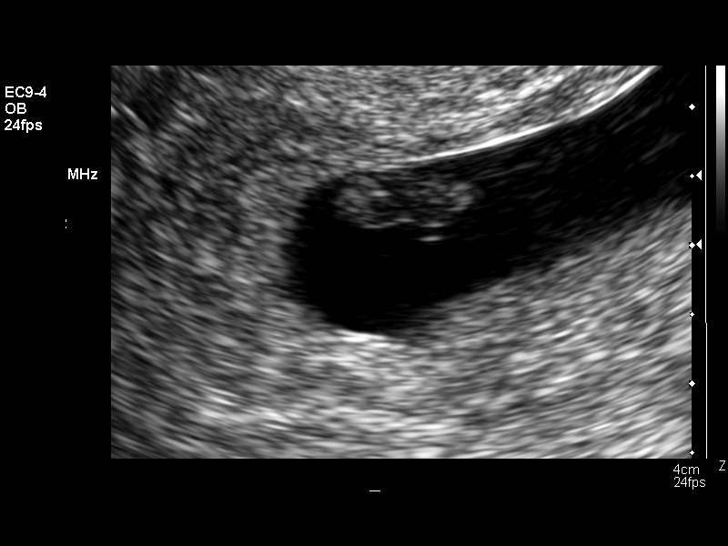
[im 24/31]
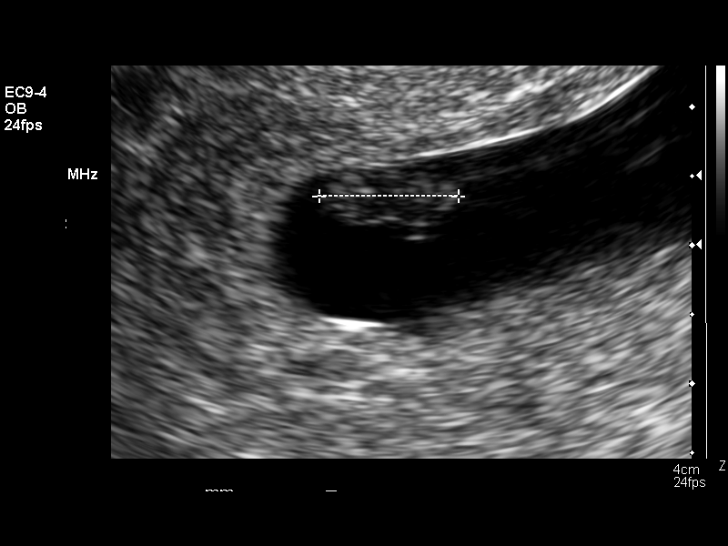
[im 26/31]
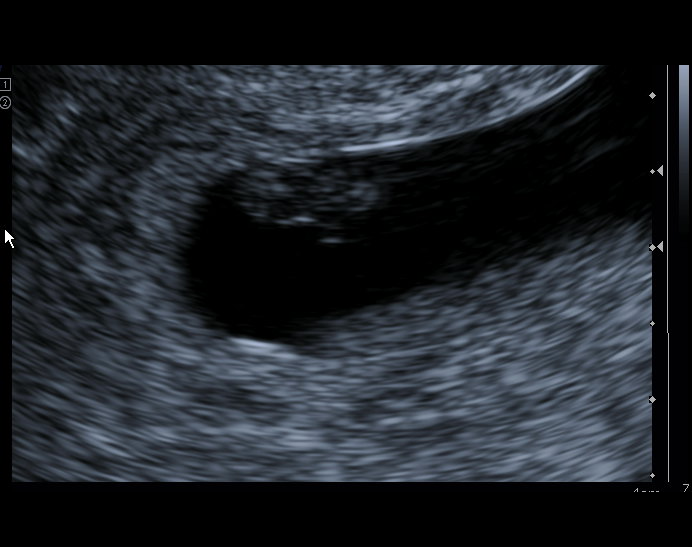
[im 28/31]
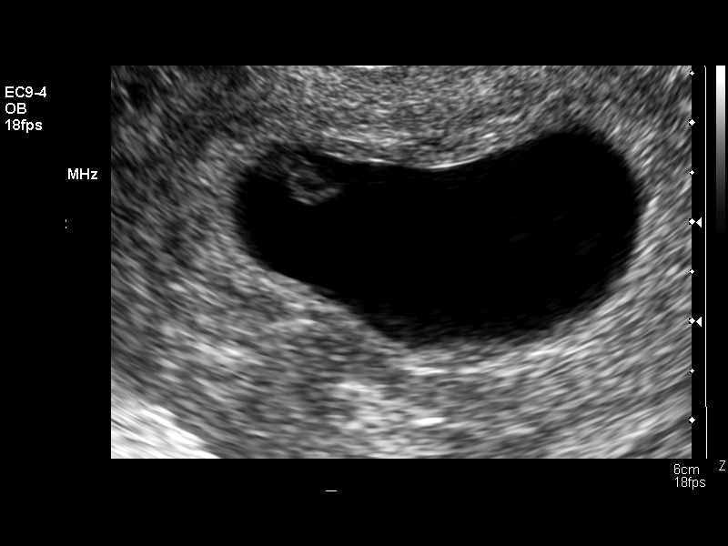
[im 31/31]
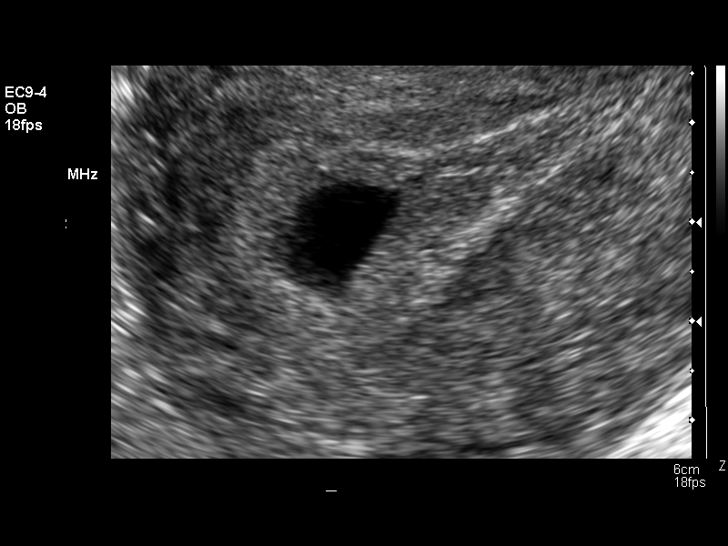

[14 of 28 positions shown; findings below may reference images not displayed]

FINDINGS: Within the uterus, there is a well-defined gestational sac.  A fetal pole is identified with crown-rump length of 10 mm.  This corresponds to an age of 7 weeks 0 days.  However, no fetal heart tones are identified.  This is confirmed with color, M-mode, and subjective evaluation.  No definite yolk sac is identified. 
 The ovaries have a normal appearance.  Note is made of a corpus luteum cyst seen on the left ovary (confirmed with technologist).
IMPRESSION: 1.  Intrauterine gestational sac and embryo.  The lack of fetal heart tones given the crown-rump length is consistent with an early demise.  
 2.  Left corpus luteum cyst.

## 2007-01-17 ENCOUNTER — Inpatient Hospital Stay (HOSPITAL_COMMUNITY): Admission: AD | Admit: 2007-01-17 | Discharge: 2007-01-17 | Payer: Self-pay | Admitting: Family Medicine

## 2007-01-17 IMAGING — US US OB TRANSVAGINAL MODIFY
1 series · 14 of 22 positions shown · non-contrast
Comparison: None.

CLINICAL DATA: Vaginal bleeding at 11 weeks and one day of pregnancy by last
menstrual period. 

COMPLETE OBSTETRICAL ULTRASOUND LESS THAN 14 WEEKS AND TRANSVAGINAL OBSTETRICAL
ULTRASOUND

[Series 1: us ob transvaginal modify · 0.18mm/px · 14 of 22 slices shown]
[im 1/22]
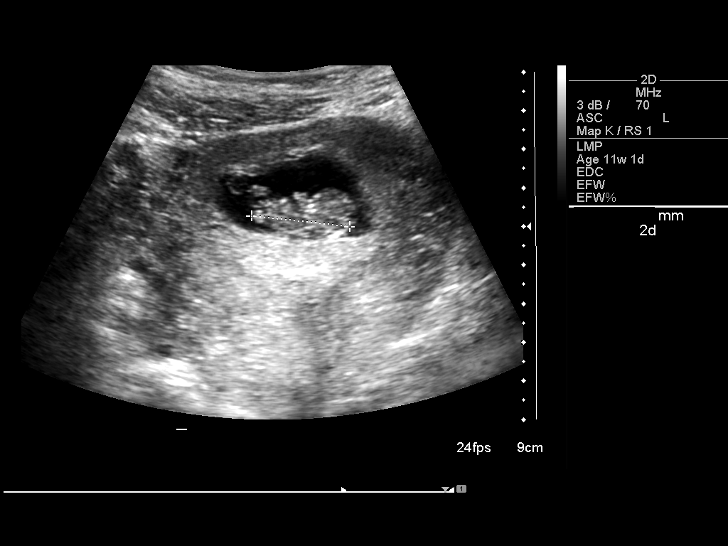
[im 3/22]
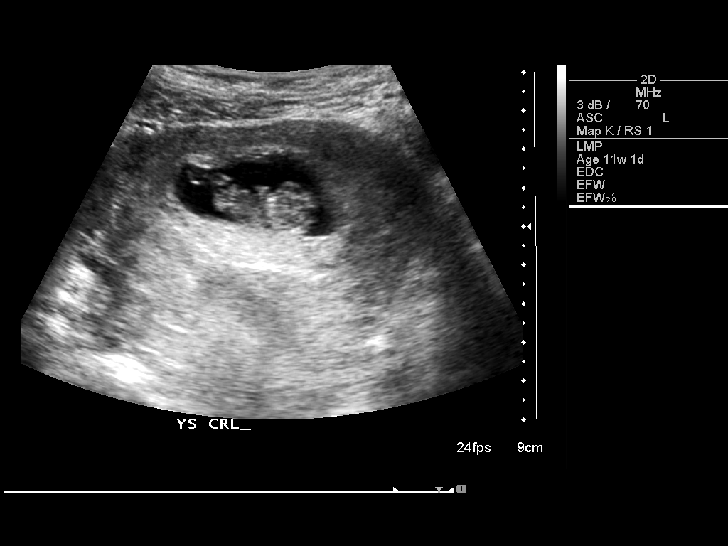
[im 4/22]
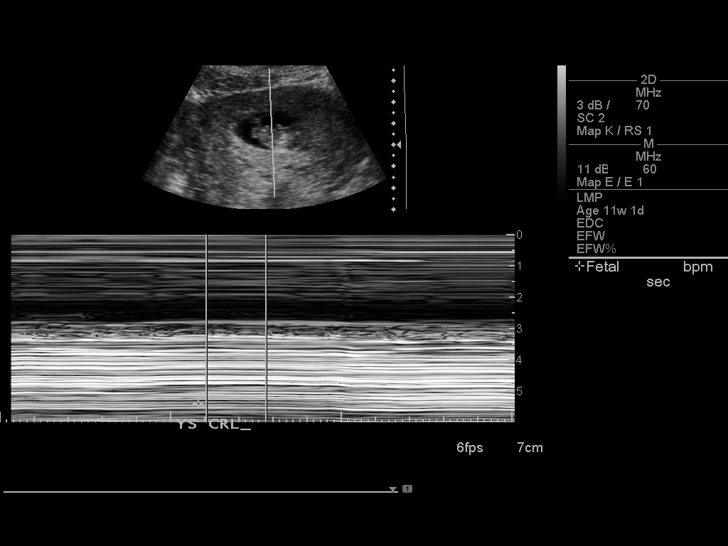
[im 6/22]
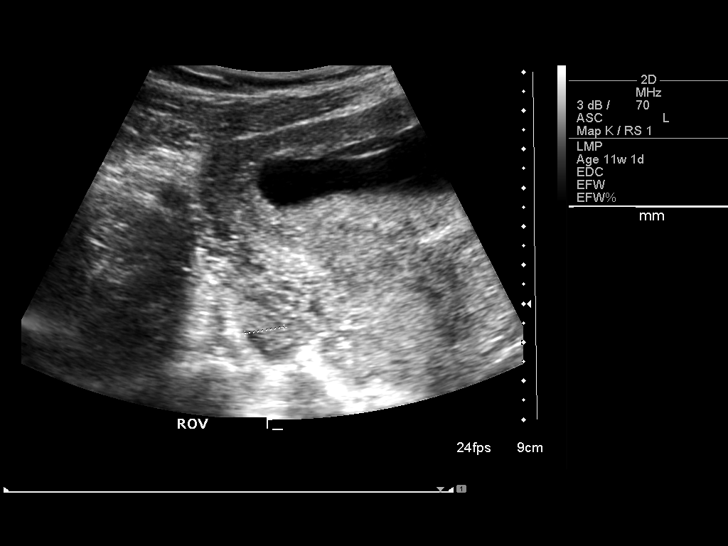
[im 8/22]
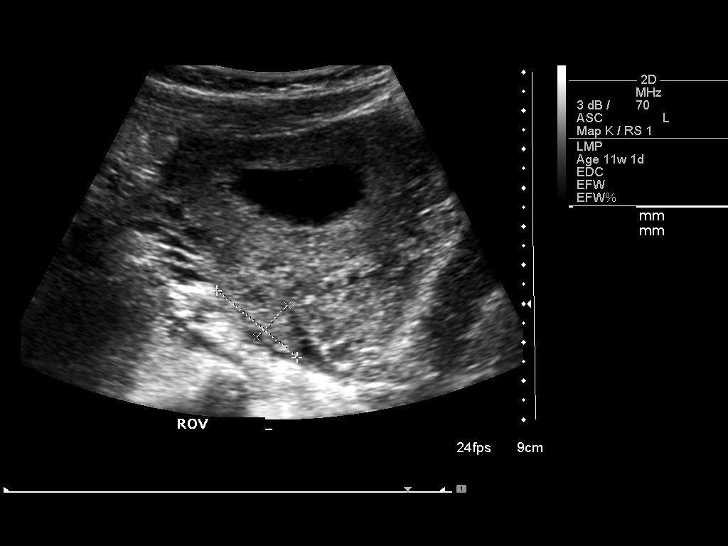
[im 9/22]
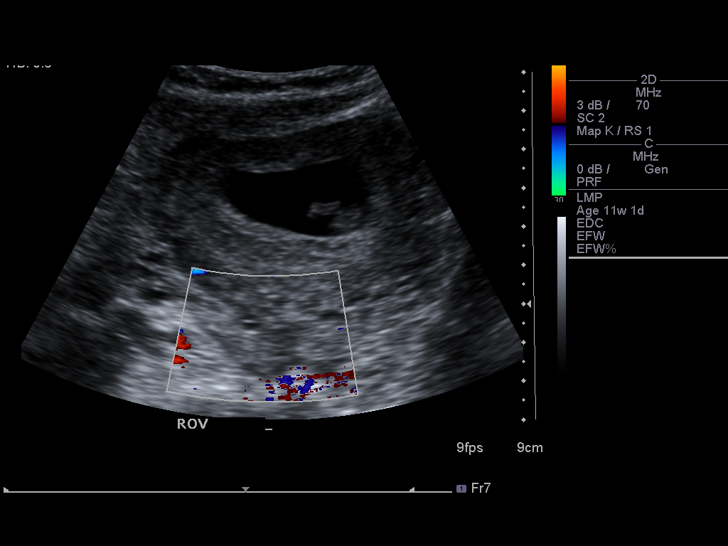
[im 11/22]
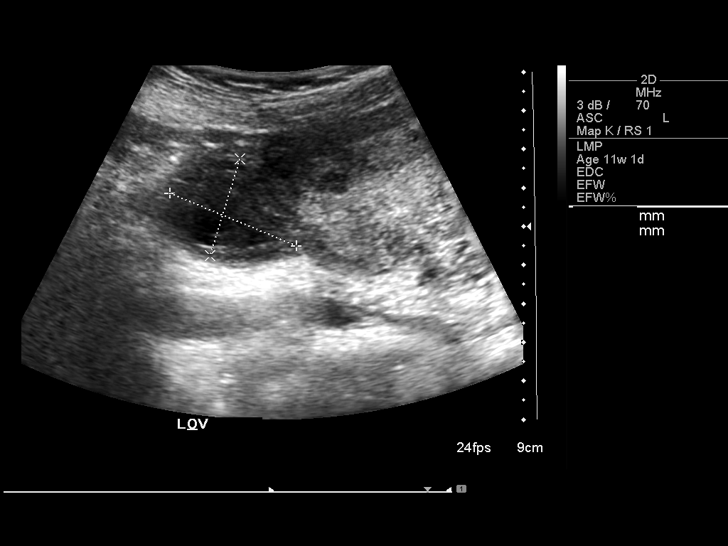
[im 12/22]
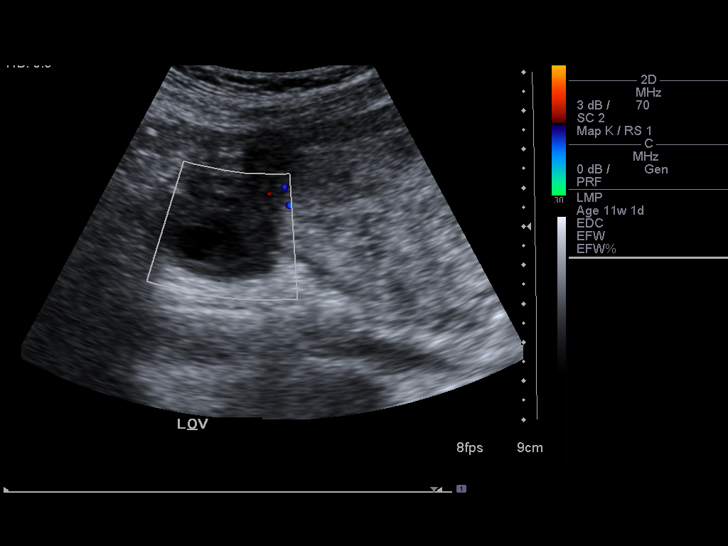
[im 14/22]
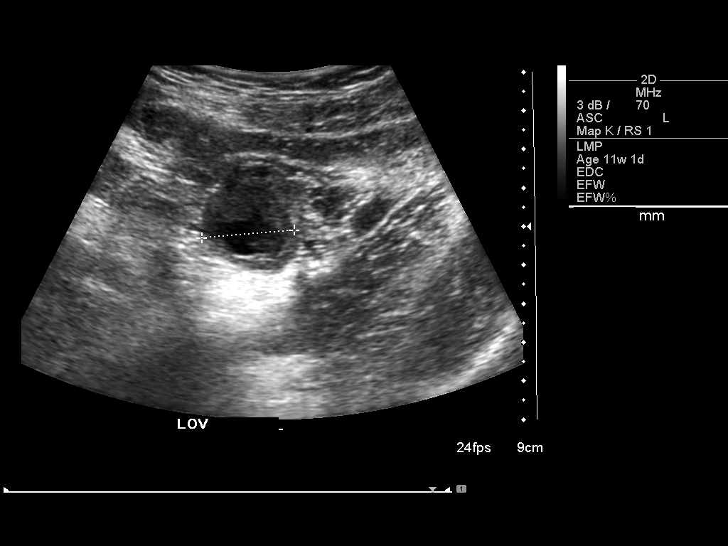
[im 15/22]
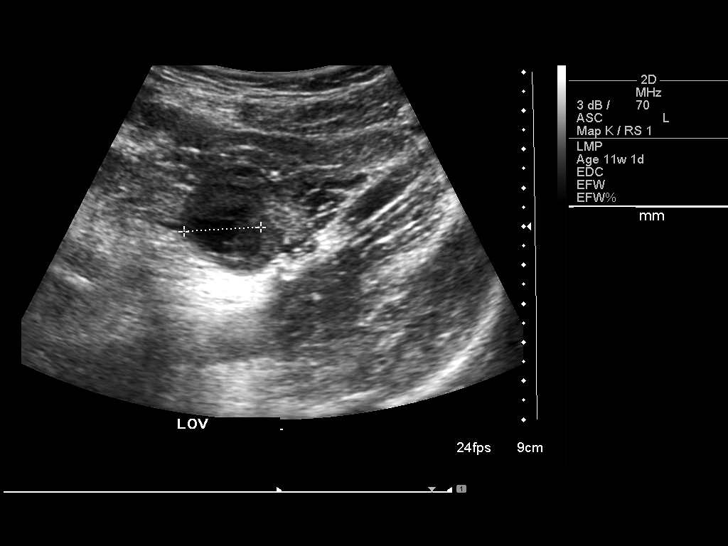
[im 17/22]
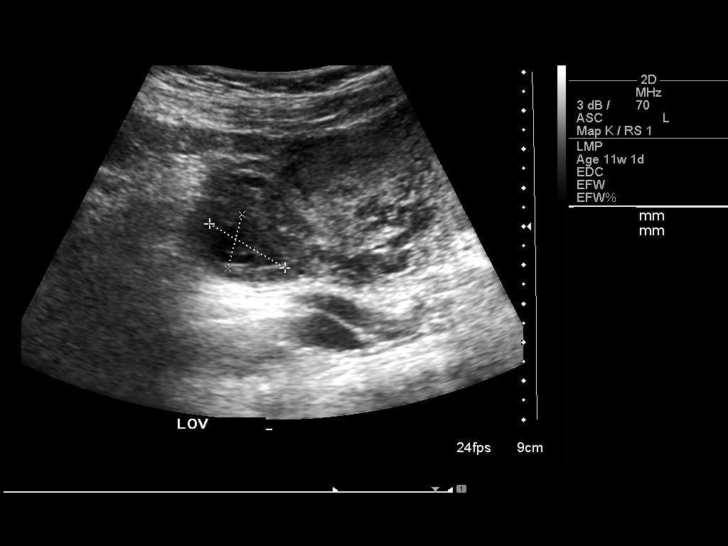
[im 19/22]
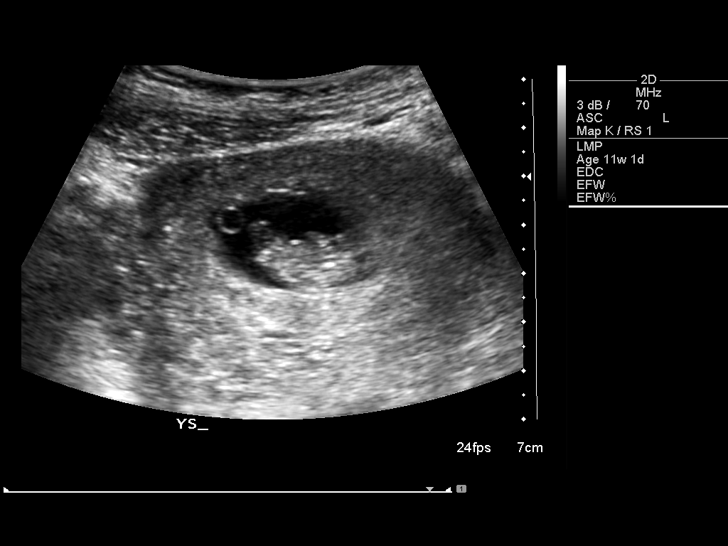
[im 20/22]
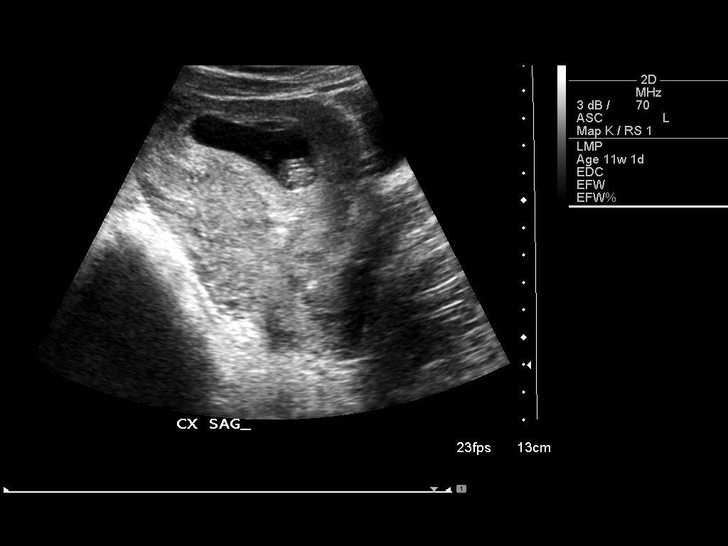
[im 22/22]
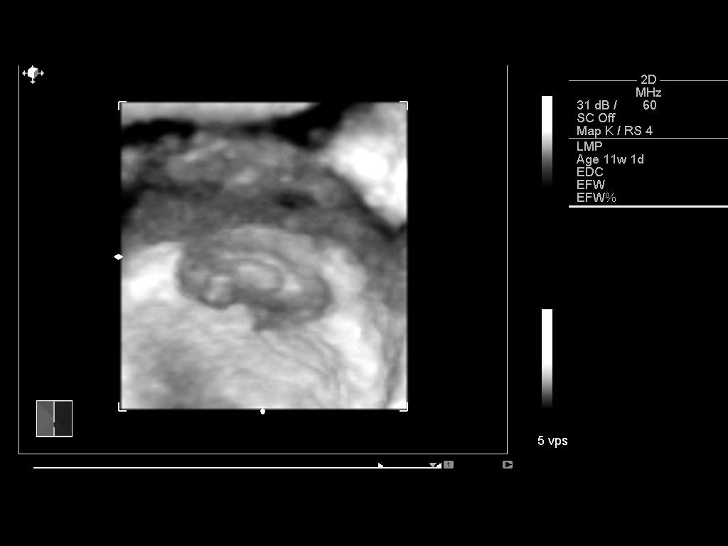

[14 of 22 positions shown; findings below may reference images not displayed]

FINDINGS: Transabdominal and transvaginal sonographic imaging of the pelvis
demonstrates a single intrauterine gestation with a mean fetal crown-rump length
of 26.3 mm, giving an estimated gestational age of 9 weeks and 3 days. Normal
fetal cardiac activity with a fetal heart rate of 171 beats per minute. Normal
appearing yolk sac. No subchorionic hemorrhage seen. Normal appearing maternal
ovaries. No free peritoneal fluid.

IMPRESSION

Single, normal appearing, live intrauterine gestation with an estimated
gestational age of 9 weeks and 3 days. No complicating features seen.

## 2007-02-21 ENCOUNTER — Inpatient Hospital Stay (HOSPITAL_COMMUNITY): Admission: AD | Admit: 2007-02-21 | Discharge: 2007-02-21 | Payer: Self-pay | Admitting: Obstetrics & Gynecology

## 2007-02-21 IMAGING — US US OB COMP +14 WK
2 series · 14 of 28 positions shown · non-contrast
Comparison: none

OBSTETRICAL ULTRASOUND:

 This ultrasound exam was performed in the [HOSPITAL] Ultrasound Department.  The OB US report was generated in the AS system, and faxed to the ordering physician.  This report is also available in [REDACTED] PACS.

[Series 1: us ob comp +14 wk · 5 of 12 slices shown (1 of 2)]
[im 2/12]
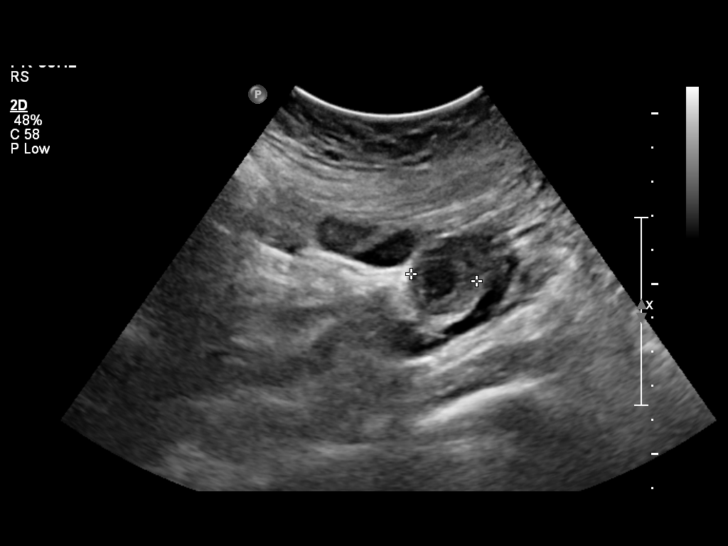
[im 4/12]
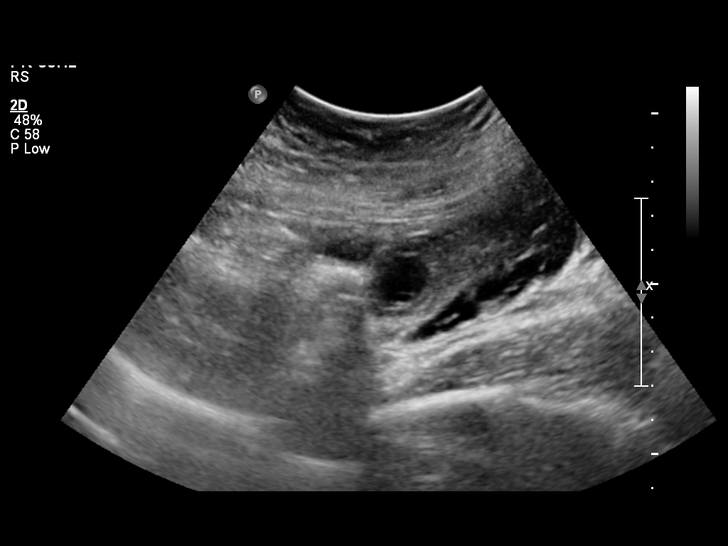
[im 7/12]
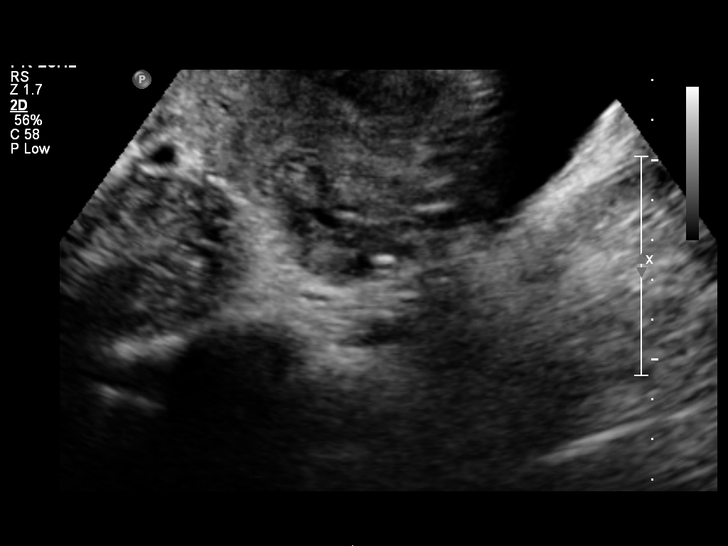
[im 9/12]
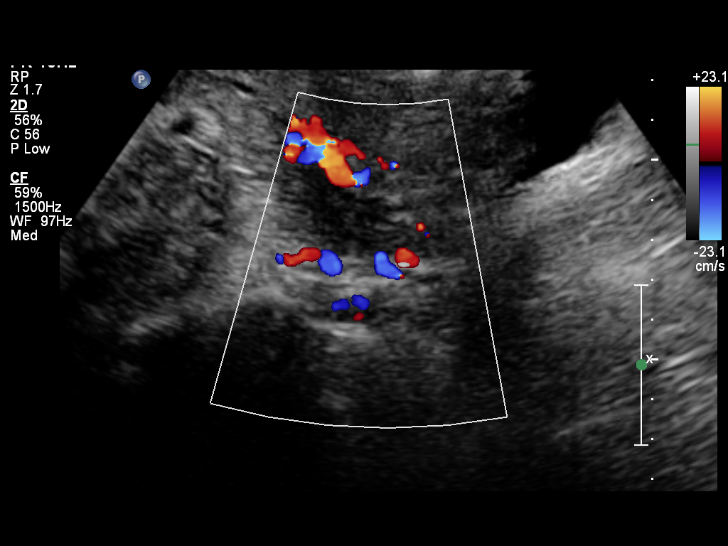
[im 12/12]
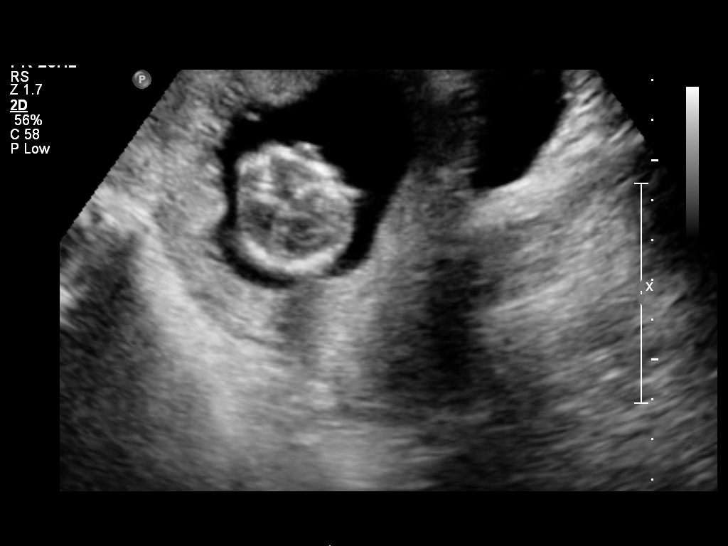

[Series 1: us ob comp +14 wk · 9 of 23 slices shown (2 of 2)]
[im 2/23]
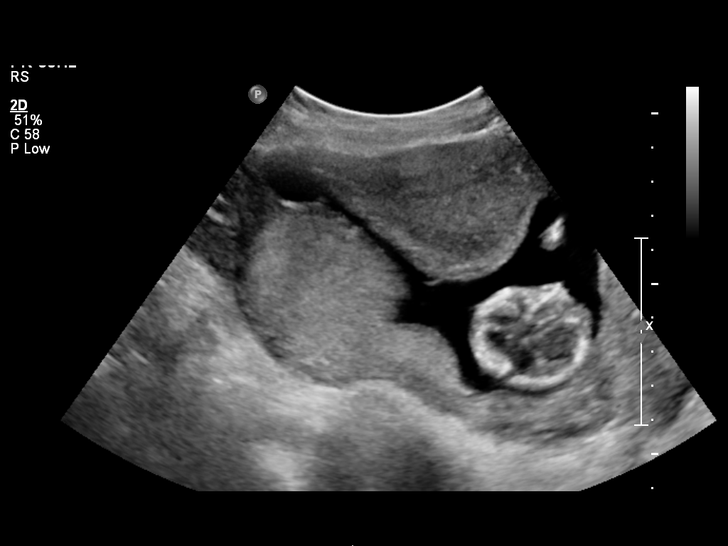
[im 4/23]
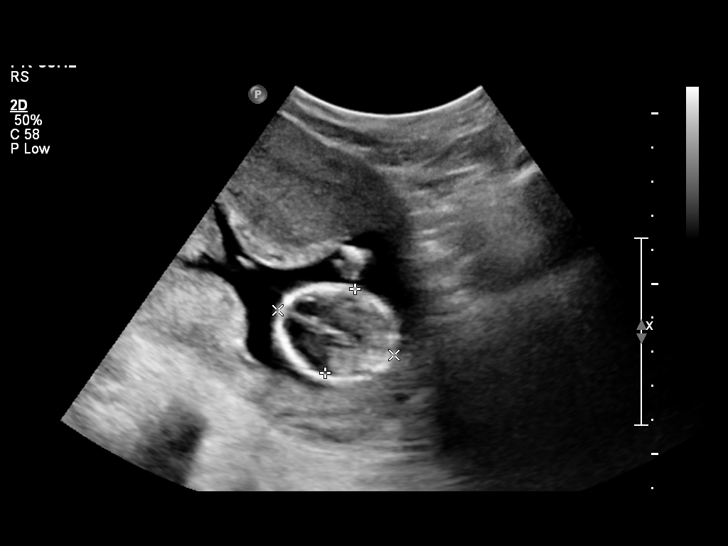
[im 7/23]
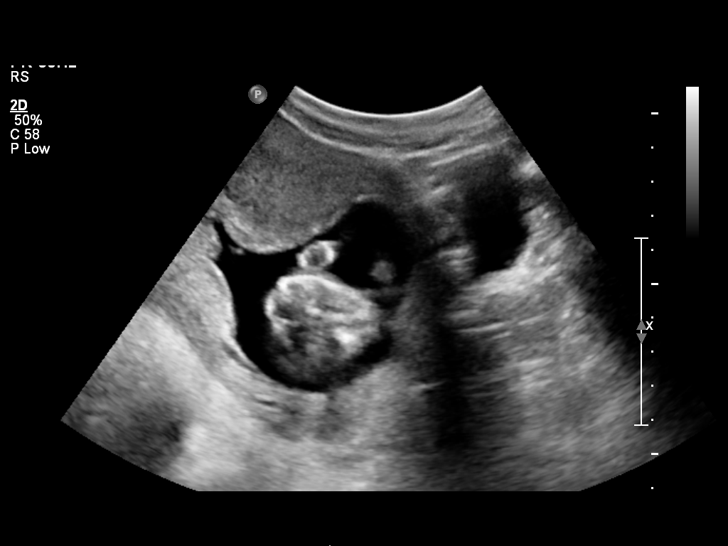
[im 10/23]
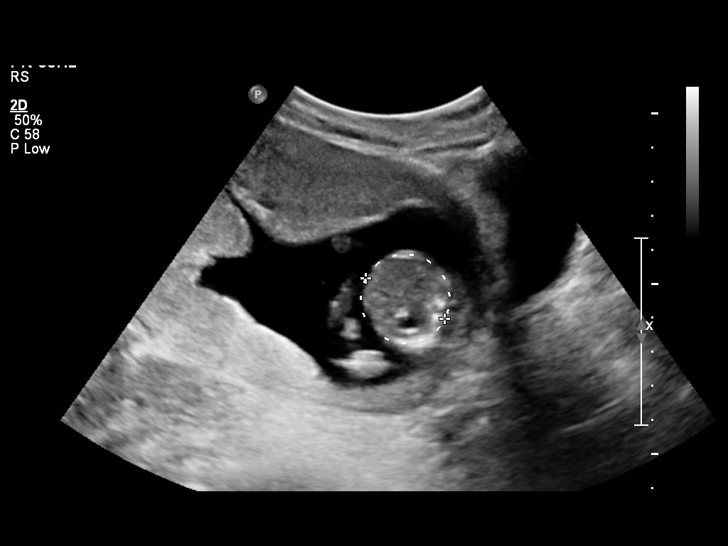
[im 12/23]
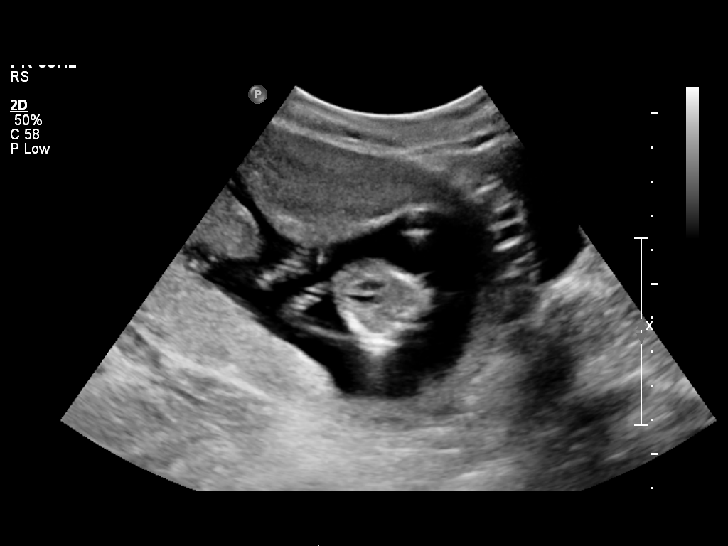
[im 15/23]
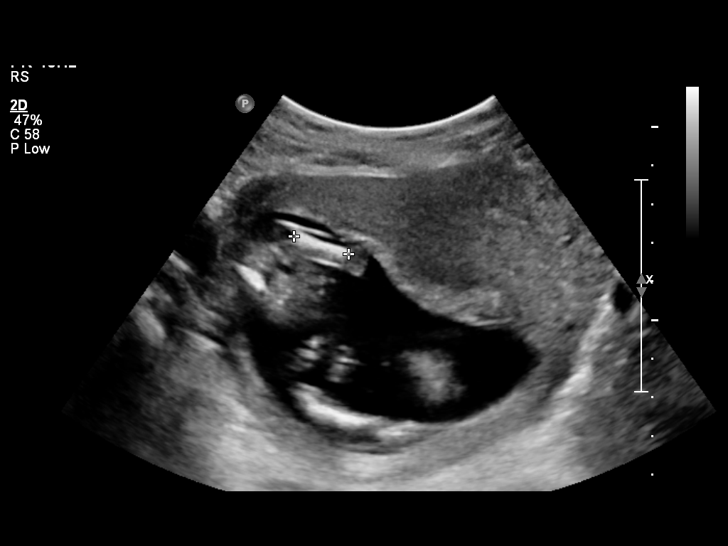
[im 17/23]
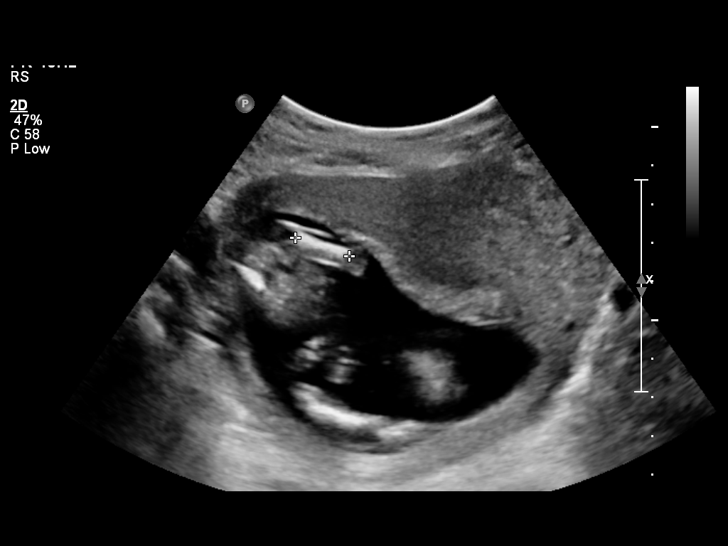
[im 20/23]
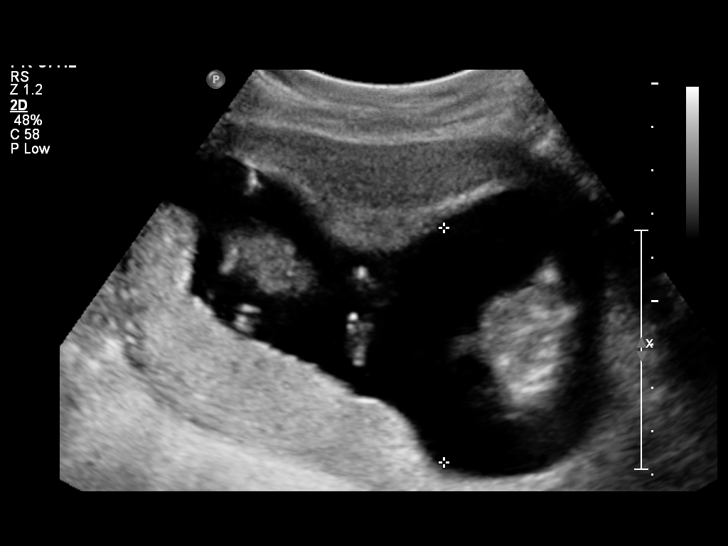
[im 23/23]
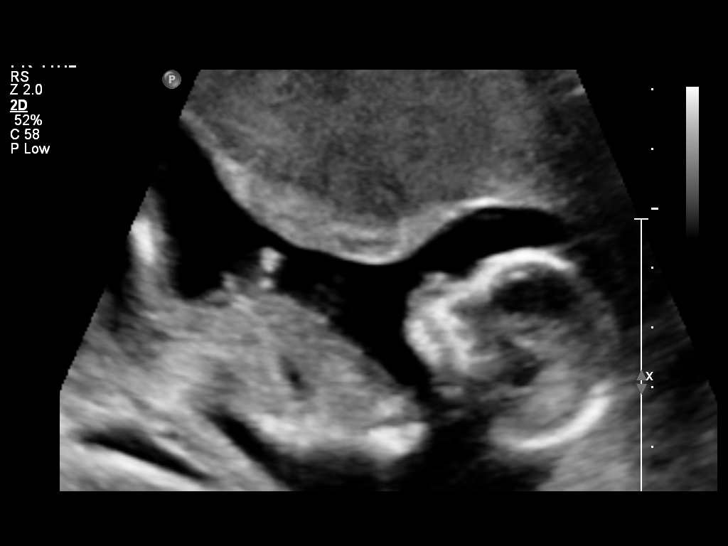

[14 of 28 positions shown; findings below may reference images not displayed]

IMPRESSION: See [facility] Obstetric US report.

## 2007-08-10 ENCOUNTER — Inpatient Hospital Stay (HOSPITAL_COMMUNITY): Admission: AD | Admit: 2007-08-10 | Discharge: 2007-08-12 | Payer: Self-pay | Admitting: Obstetrics and Gynecology

## 2007-12-14 ENCOUNTER — Emergency Department (HOSPITAL_COMMUNITY): Admission: EM | Admit: 2007-12-14 | Discharge: 2007-12-14 | Payer: Self-pay | Admitting: Emergency Medicine

## 2009-03-23 ENCOUNTER — Inpatient Hospital Stay (HOSPITAL_COMMUNITY): Admission: AD | Admit: 2009-03-23 | Discharge: 2009-03-24 | Payer: Self-pay | Admitting: Obstetrics & Gynecology

## 2009-03-23 IMAGING — US US OB COMP LESS 14 WK
1 series · 14 of 28 positions shown · non-contrast
Comparison: None available.

CLINICAL DATA: Pregnant female with vaginal bleeding.

OBSTETRIC <14 WK US AND TRANSVAGINAL OB US
TECHNIQUE: Both transabdominal and transvaginal ultrasound
examinations were performed for complete evaluation of the
gestation as well as the maternal uterus, adnexal regions, and
pelvic cul-de-sac.

[Series 1: us ob comp less 14 wks · 0.15mm/px · 14 of 39 slices shown]
[im 2/39]
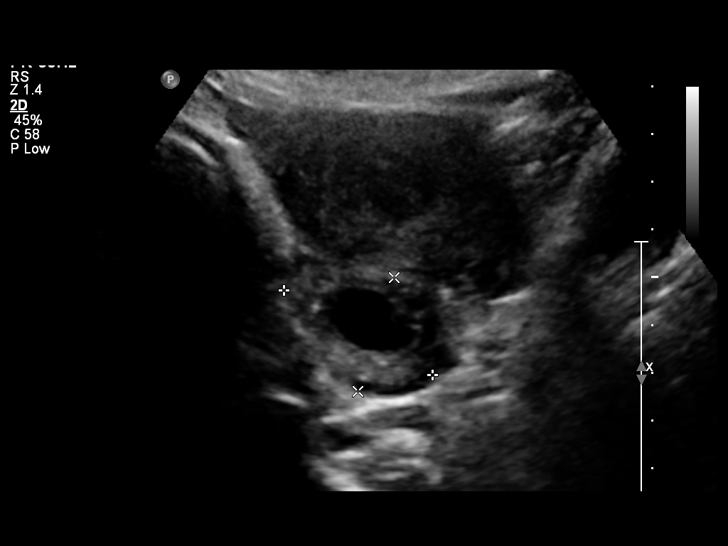
[im 5/39]
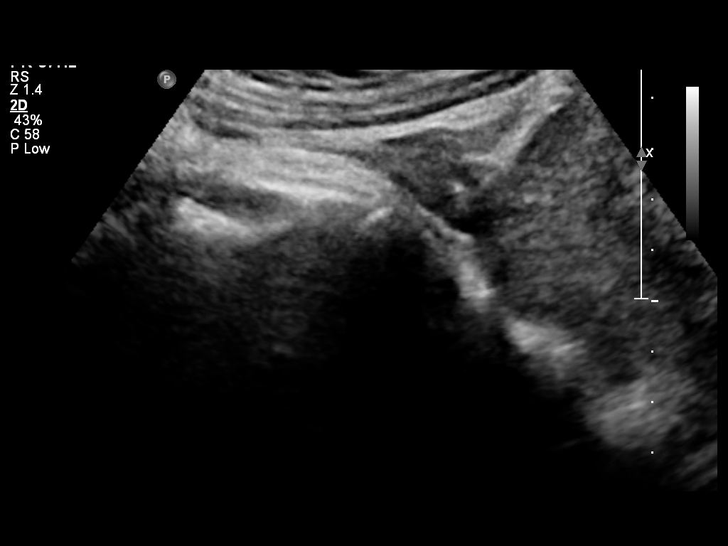
[im 8/39]
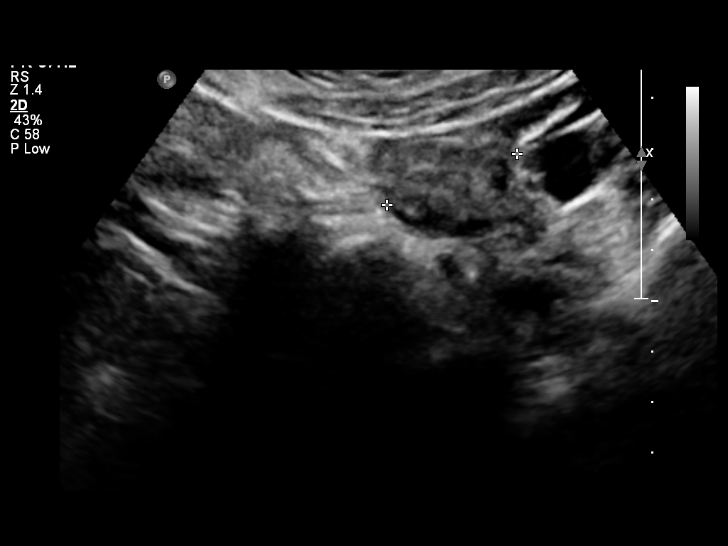
[im 10/39]
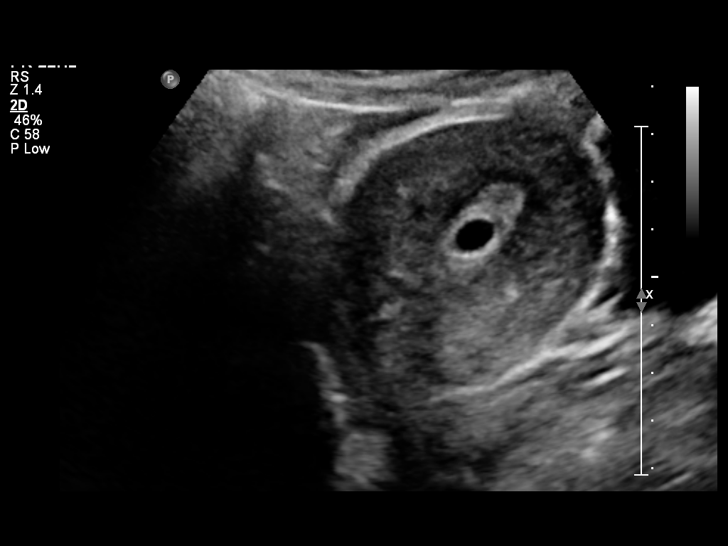
[im 13/39]
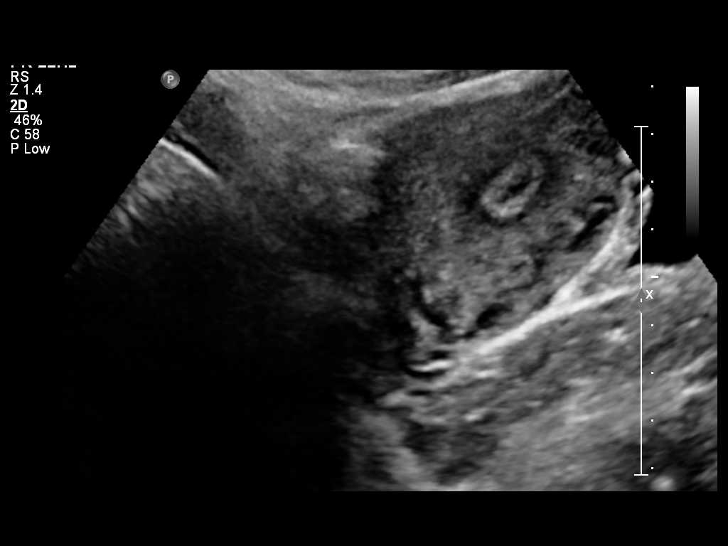
[im 16/39]
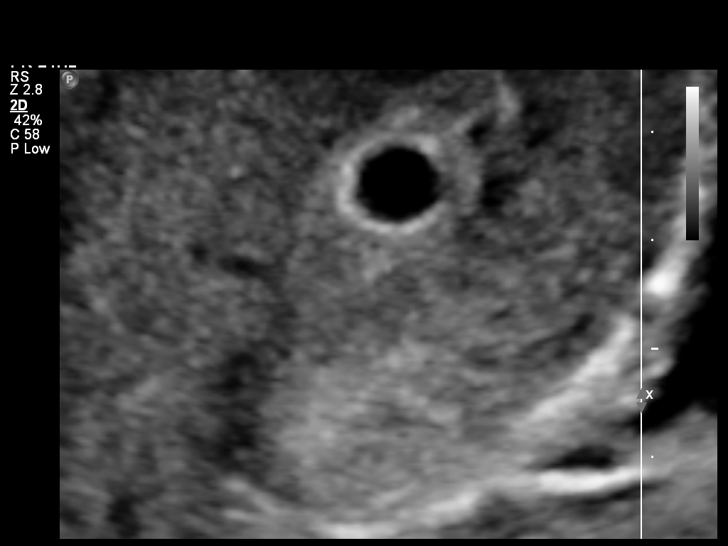
[im 19/39]
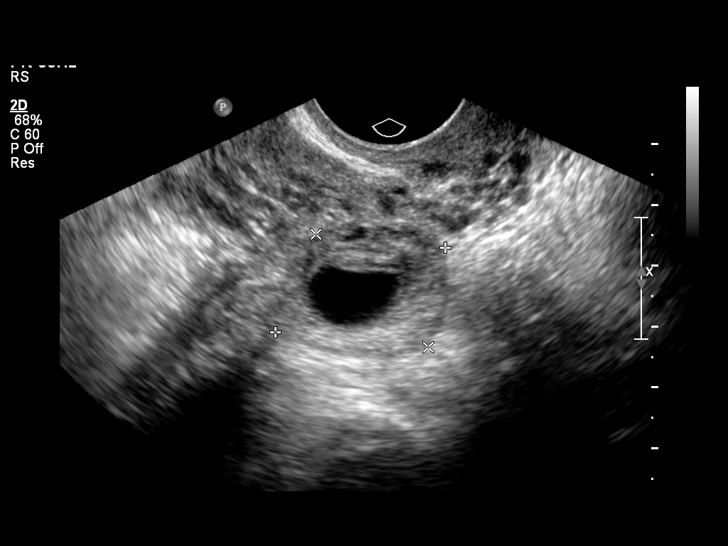
[im 22/39]
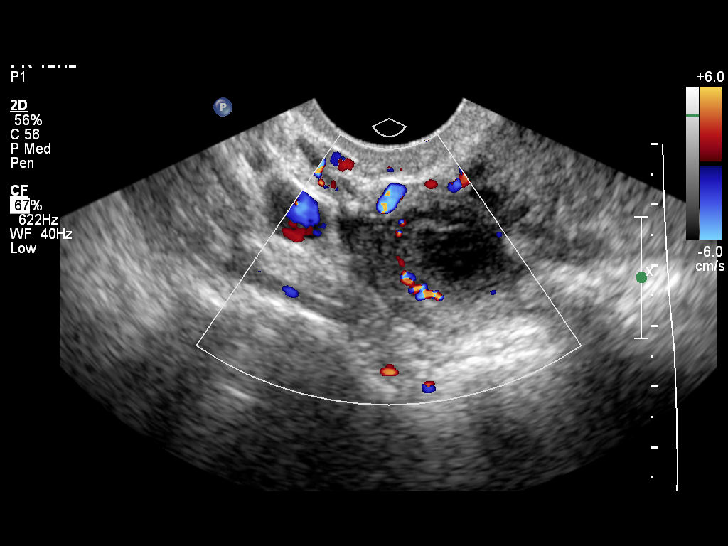
[im 24/39]
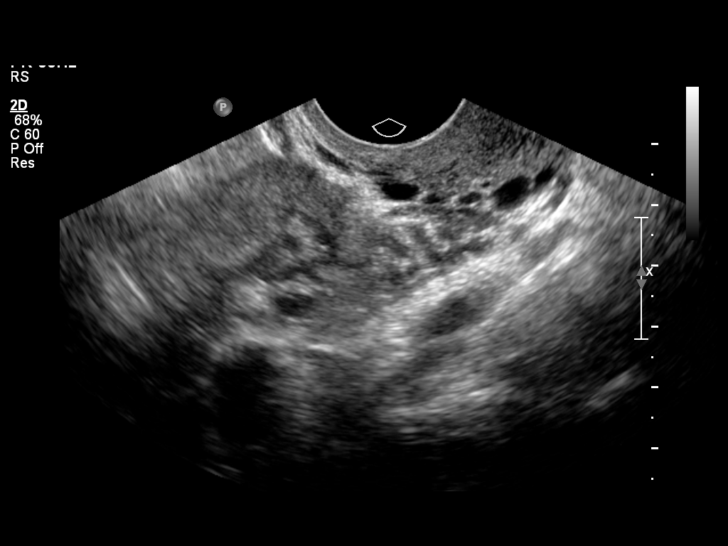
[im 27/39]
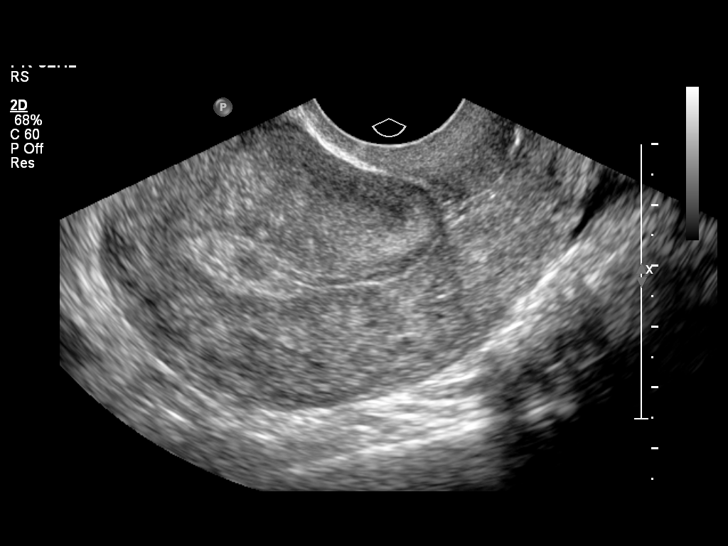
[im 30/39]
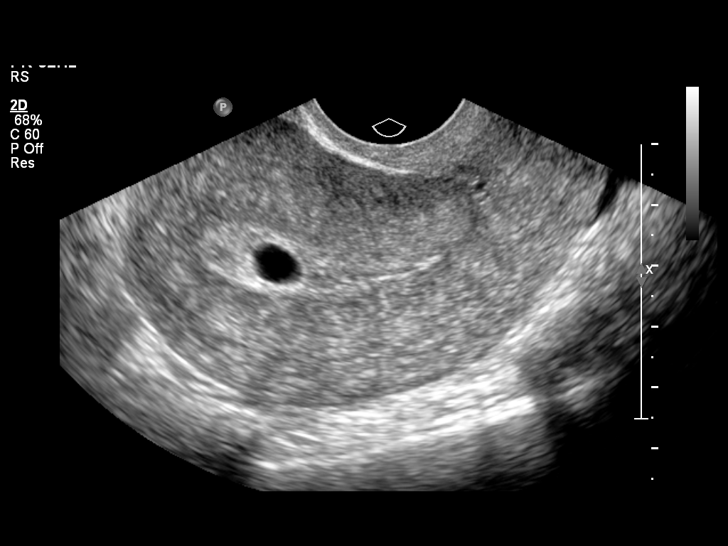
[im 33/39]
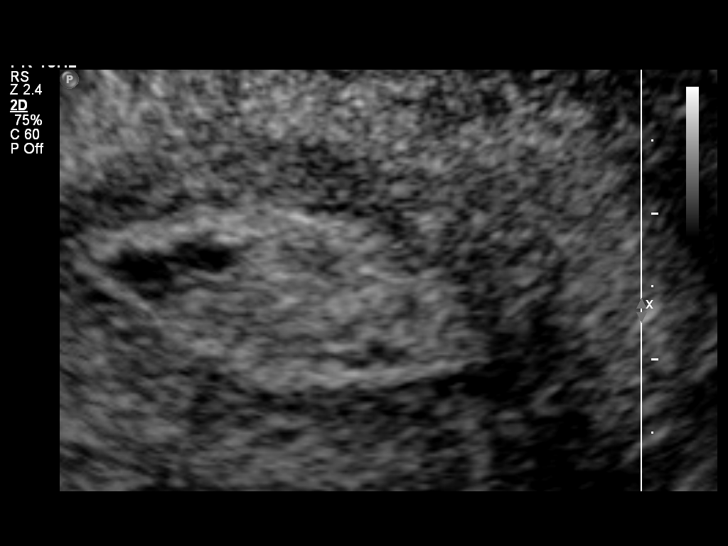
[im 36/39]
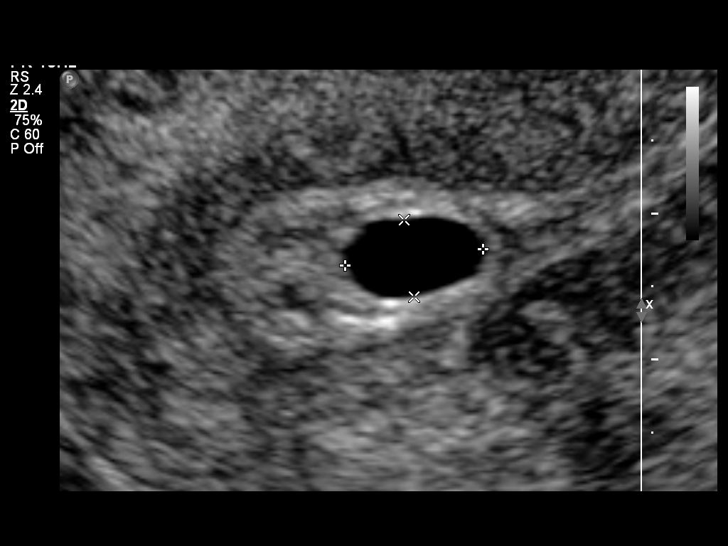
[im 39/39]
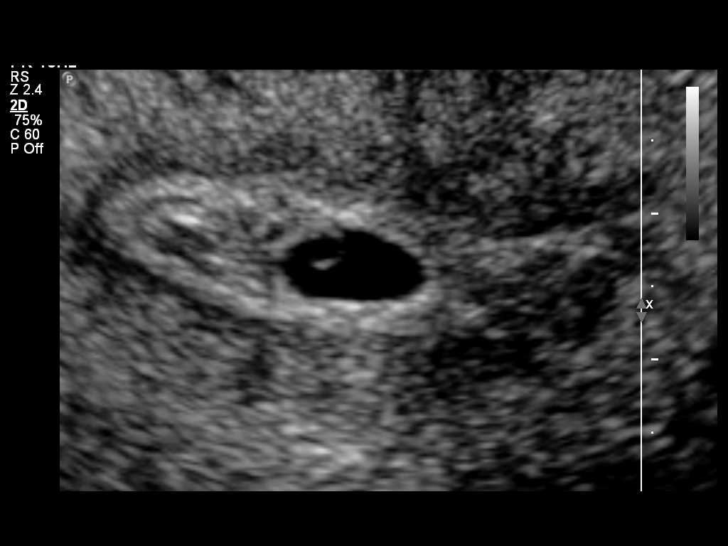

[14 of 28 positions shown; findings below may reference images not displayed]

Intrauterine gestational sac: Single gestational sac visualized.
Yolk sac: Visualized.
Embryo: Not visualized.
Cardiac Activity: Not detected.
MSD: 0.82cm  5    w 3    d
US EDC: [DATE]

Maternal uterus/adnexae:
Corpus luteum cyst on the right noted.  Left adnexa unremarkable.
IMPRESSION: Intrauterine gestational sac and yolk sack identified.  No embryo
or cardiac activity.  Findings may be due to early gestational age.
Recommend correlation with quantitative HCG.  Repeat ultrasound as
needed.

## 2009-03-24 ENCOUNTER — Inpatient Hospital Stay (HOSPITAL_COMMUNITY): Admission: AD | Admit: 2009-03-24 | Discharge: 2009-03-24 | Payer: Self-pay | Admitting: Obstetrics and Gynecology

## 2009-03-25 ENCOUNTER — Inpatient Hospital Stay (HOSPITAL_COMMUNITY): Admission: AD | Admit: 2009-03-25 | Discharge: 2009-03-25 | Payer: Self-pay | Admitting: Obstetrics & Gynecology

## 2009-03-30 ENCOUNTER — Emergency Department (HOSPITAL_COMMUNITY): Admission: EM | Admit: 2009-03-30 | Discharge: 2009-03-30 | Payer: Self-pay | Admitting: Emergency Medicine

## 2009-03-31 ENCOUNTER — Emergency Department (HOSPITAL_COMMUNITY): Admission: EM | Admit: 2009-03-31 | Discharge: 2009-04-01 | Payer: Self-pay | Admitting: Emergency Medicine

## 2009-04-06 ENCOUNTER — Emergency Department (HOSPITAL_COMMUNITY): Admission: EM | Admit: 2009-04-06 | Discharge: 2009-04-06 | Payer: Self-pay | Admitting: Emergency Medicine
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
## 2010-10-22 LAB — POCT I-STAT, CHEM 8
BUN: 14 mg/dL (ref 6–23)
HCT: 34 % — ABNORMAL LOW (ref 36.0–46.0)
Sodium: 140 mEq/L (ref 135–145)
TCO2: 27 mmol/L (ref 0–100)

## 2010-10-22 LAB — GC/CHLAMYDIA PROBE AMP, GENITAL
Chlamydia, DNA Probe: NEGATIVE
GC Probe Amp, Genital: NEGATIVE

## 2010-10-22 LAB — CBC
HCT: 30.8 % — ABNORMAL LOW (ref 36.0–46.0)
Hemoglobin: 10.3 g/dL — ABNORMAL LOW (ref 12.0–15.0)
MCV: 78.2 fL (ref 78.0–100.0)
Platelets: 234 10*3/uL (ref 150–400)
RBC: 4.03 MIL/uL (ref 3.87–5.11)
RDW: 14.7 % (ref 11.5–15.5)
RDW: 15.2 % (ref 11.5–15.5)

## 2010-10-22 LAB — WET PREP, GENITAL: Trich, Wet Prep: NONE SEEN

## 2010-10-22 LAB — HCG, QUANTITATIVE, PREGNANCY: hCG, Beta Chain, Quant, S: 2369 m[IU]/mL — ABNORMAL HIGH (ref <5)

## 2010-11-30 NOTE — Discharge Summary (Signed)
Charlene [name], Charlene [name]              ACCOUNT NO.:  [id number]   MEDICAL RECORD NO.:  [id number]          PATIENT TYPE:  INP   LOCATION:  9114                          FACILITY:  [facility]   PHYSICIAN:  Zenaida Niece, M.D.DATE OF BIRTH:  December 30, 1965   DATE OF ADMISSION:  08/10/2007  DATE OF DISCHARGE:  08/12/2007                               DISCHARGE SUMMARY   ADMISSION DIAGNOSES:  1. Intrauterine pregnancy at 38+ weeks.  2. Previous cesarean section x2.  3. Vaginal birth after cesarean section x3.  4. Group B strep carrier.   DISCHARGE DIAGNOSES:  1. Intrauterine pregnancy at 38+ weeks.  2. Previous cesarean section x2.  3. Vaginal birth after cesarean section x3.  4. Group B strep carrier.   PROCEDURES:  On January 23, she had VBAC.   HISTORY AND PHYSICAL:  This is a 46 year old black female gravida 7,  para 5-0-1-5, with an EGA of 38+ weeks who presents with the complaint  of contractions and vaginal bleeding.  On evaluation in triage, she was  contracting, and the first nurse was unable to tell how dilated she was  or the presenting part.  She had ultrasound which confirmed vertex  presentation, and per another nurse, her cervix was 2, 80, -3 with a  vertex presentation as well.  She was admitted in early labor.  Prenatal  care complicated only by the fact that she has had two cesarean sections  and 3 VBACs and desires another VBAC.   PRENATAL LABORATORY DATA:  Blood type is O+ with negative antibody  screen, rubella nonimmune, hepatitis B surface antigen negative, RPR  nonreactive, HIV nonreactive, gonorrhea and chlamydia negative. Group B  strep is positive.   PAST OB HISTORY:  Two cesarean sections followed by three VBACs and one  spontaneous abortion.   PAST MEDICAL HISTORY:  Beta thalassemia minor.   PAST SURGICAL HISTORY:  Cesarean section x2.   PHYSICAL EXAMINATION:  VITAL SIGNS:  She is afebrile with stable vital  signs.  ABDOMEN:  Gravid, nontender with an  estimated fetal weight of 7-1/2  pounds.  PELVIC:  Cervix per the nurse is 2, 80, -3, vertex presentation.  Fetal  heart tracing is reactive with irregular contractions, and presentation  is confirmed vertex by ultrasound.   HOSPITAL COURSE:  The patient was admitted and put on penicillin for  group B strep prophylaxis.  I examined her, did amniotomy, and placed  internal monitors.  At that time she was 2, 80 and -2.  She did slowly  progress despite contractions only every 5-8 minutes.  Once she got to 8  cm, she decided she wanted an epidural.  After that, she was started on  low-dose Pitocin, progressed to complete, pushed well, and on the  evening of January 23 had a VBAC of a viable female infant with Apgars  of 8/9, weighed 6 pounds 11 ounces.  Placenta delivered spontaneously  intact.  Perineum and uterus were intact, and estimated blood loss was  500 mL.   Postpartum, she had no significant complications.  Predelivery  hemoglobin 10.3, postdelivery 9.1.  On postpartum #  2, she was felt to be  stable enough for discharge home.   DISCHARGE INSTRUCTIONS:  Regular diet, pelvic rest, followup in 6 weeks.  Medications are over-the-counter ibuprofen as needed, and she is given  our discharge pamphlet.      Zenaida Niece, M.D.  Electronically Signed     TDM/MEDQ  D:  08/12/2007  T:  08/12/2007  Job:  [id number]

## 2010-12-03 NOTE — H&P (Signed)
Charlene [name], CUDNEY NO.:  [id number]   MEDICAL RECORD NO.:  [id number]          PATIENT TYPE:  INP   LOCATION:  9160                          FACILITY:  [facility]   PHYSICIAN:  Richardean Sale, M.D.   DATE OF BIRTH:  1965-10-23   DATE OF ADMISSION:  12/15/2004  DATE OF DISCHARGE:                                HISTORY & PHYSICAL   ADMISSION DIAGNOSIS:  Early labor.   HISTORY OF PRESENT ILLNESS:  This is a 45 year old gravida 6, para 4-0-1-4,  African-American female at 54 weeks and two days' gestation by ultrasound  with a due date of Dec 13, 2004, who presented today complaining of  contractions approximately every 4-5 minutes apart.  She reports good fetal  movement, denies any vaginal bleeding or loss of fluid.  The patient was  scheduled for an induction of labor yesterday, but was discharged to home  secondary to an unfavorable cervix.  Cervix yesterday was 1, 80%, -1 to -2  station, and posterior per the patient's primary physician, Dr. Cherly Hensen.  Cervical exam today in maternity admissions is 1-2 cm, 90%, -1 station, and  still posterior.  Fetal heart rate tracing is reactive and contractions are  occurring every 4-5 minutes.  Prenatal care has been at [facility] with  Dr. Cherly Hensen as her primary physician.  Pregnancy is complicated by one  episode of third trimester bleeding.  Ultrasound was  performed which  revealed a posterior placenta with no evidence of previa and no evidence of  abruption.   OBSTETRIC HISTORY:  Significant for two prior cesarean sections followed by  two successful vaginal deliveries.  The patient has been counseled by her  primary physician and by myself extensively regarding the risks of attempted  vaginal birth after C-section versus repeat cesarean section and the patient  declines cesarean section at this time.   Past obstetric history:  Gravida 1, primary cesarean section, 40 weeks, 6  pounds 14 ounces, in 1987.  Gravida 2,  repeat cesarean section at 37 weeks,  8 pounds, 1988.  Gravida 3, spontaneous vaginal delivery, 7 pounds 14  ounces, 1992, no complications.  Gravida 4, induced at 40 weeks, 7+ pound  infant.  The patient reports mild increase of blood pressure during that  delivery, but denies being on magnesium sulfate therapy.  Gravida 5,  termination   PAST GYNECOLOGIC HISTORY:  No prior abnormal Pap smears or sexually  transmitted infections.   PAST MEDICAL HISTORY:  1.  UTI x1.  2.  Microcytic anemia.  3.  Beta thalassemia traits and iron deficiency anemia.  Father of the baby      -hemoglobin electrophoresis was normal.  Denies any nonobstetric      hospitalizations.   PAST SURGICAL HISTORY:  1.  Cesarean section x2.  2.  TAB x1.   SOCIAL HISTORY:  She is married.  This is a new father and his first child.  The patient works as a Museum/gallery conservator.  Denies tobacco, alcohol, or  drugs.   FAMILY HISTORY:  No known congenital anomalies, birth defects, cystic  fibrosis, spina bifida,  Down's syndrome.  The patient's aunt died secondary  to sickle cell disease.   MEDICATIONS:  Prenatal vitamins.   ALLERGIES:  No known drug allergies.   REVIEW OF SYSTEMS:  Positive for contractions, negative for vaginal  bleeding, negative for loss of fluid, positive for good fetal movement.  Negative for headache, negative for visual changes, negative for epigastric  pain.   PHYSICAL EXAMINATION:  VITAL SIGNS:  Blood pressure is 130-140 over 80s to  90s.  She is afebrile.  GENERAL:  She is a well-developed, well-nourished black female who appears  uncomfortable with contractions but is in no acute distress.  HEART:  Regular rate and rhythm.  LUNGS:  Clear to auscultation bilaterally.  ABDOMEN:  Gravid.  Contractions palpate moderate.  Abdomen is soft in  between.  There is no rebound or guarding.  Appears AGA.  EXTREMITIES:  There is trace edema in the feet bilaterally.  Nontender.  No  cyanosis or  clubbing.  NEUROLOGIC:  Nonfocal.  Deep tendon reflexes are 1-2+ bilaterally with no  clonus.  PELVIC:  Cervix is 1-2 cm, 90%, -1 station, vertex, posterior.  Fetal heart  tracing is reactive, tocometer tracing reveals contractions approximately  every five minutes.   LABORATORY DATA:  Prenatal labs:  Blood type is O positive, antibody screen  negative.  RPR nonreactive.  Rubella is nonimmune.  Hepatitis surface  antigen negative.  HIV was declined.  Sickle cell trait negative.  Positive  beta thalassemia trait.  Pap within normal limits.  Gonorrhea and Chlamydia  screens negative.  Triple test within normal limits.  One hour Glucola  within normal limits.  Group B beta Strep positive.  Normal anatomic survey  at 20 weeks' gestation.   ASSESSMENT:  A 45 year old gravida 6, para 4-0-1-4, black female, at 40  weeks and two days' gestation in early labor.  History of cesarean section  x2 and history of successful vaginal delivery after cesarean section after  cesarean section x2, who desires repeated trial of labor.   PLAN:  1.  Will admit to labor and delivery.  2.  Check CBC on admission.  Also check preeclampsia labs as blood pressures      are mildly elevated and continue to monitor blood pressures closely.  3.  Continuous fetal monitoring.  4.  Group B beta Strep positive.  Will administer penicillin.  5.  Desires trial of labor.  I have had an extensive conversation with both      the patient and her husband regarding the risks of uterine rupture with      a trial of labor after C-section.  I reviewed with the patient that the      risk may be as high as 1-3 in 100 and that if uterine rupture does      occur, the consequence can be devastating.  We reviewed the risk of      hemorrhage, possible hysterectomy, and possible damage to the fetus,      including hypoxia, brain damage, and possibility of fetal or neonatal     death.  The patient and her husband voiced understanding of  these risks      and she desires trial of labor and declines cesarean section at this      time.  Will perform amniotomy once      penicillin is administered and will recommend epidural given history of      prior cesarean sections.  6.  Rubella nonimmune.  Will administer rubella  vaccine postpartum.  7.  Anticipated attempts at vaginal delivery .Consent obtained.       JW/MEDQ  D:  12/15/2004  T:  12/15/2004  Job:  [id number]

## 2011-04-07 LAB — CBC
HCT: 27.6 — ABNORMAL LOW
Hemoglobin: 9.1 — ABNORMAL LOW
MCV: 77.1 — ABNORMAL LOW
RBC: 3.59 — ABNORMAL LOW
RBC: 4.03
RDW: 15.2
WBC: 7.4

## 2011-04-07 LAB — COMPREHENSIVE METABOLIC PANEL
ALT: 13
AST: 32
Albumin: 2.5 — ABNORMAL LOW
Alkaline Phosphatase: 135 — ABNORMAL HIGH
BUN: 5 — ABNORMAL LOW
Chloride: 100
GFR calc Af Amer: >60
Potassium: 4
Sodium: 133 — ABNORMAL LOW
Total Bilirubin: 0.5

## 2011-04-07 LAB — URIC ACID: Uric Acid, Serum: 4.4

## 2011-04-07 LAB — RPR: RPR Ser Ql: NONREACTIVE

## 2011-05-02 LAB — URINE MICROSCOPIC-ADD ON: WBC, UA: NONE SEEN

## 2011-05-02 LAB — URINALYSIS, ROUTINE W REFLEX MICROSCOPIC
Glucose, UA: NEGATIVE
Leukocytes, UA: NEGATIVE
Protein, ur: NEGATIVE
Specific Gravity, Urine: 1.015
pH: 6

## 2011-05-03 LAB — URINALYSIS, ROUTINE W REFLEX MICROSCOPIC
Bilirubin Urine: NEGATIVE
Ketones, ur: NEGATIVE
Leukocytes, UA: NEGATIVE
Nitrite: NEGATIVE
Protein, ur: NEGATIVE
pH: 6.5

## 2011-05-03 LAB — URINE MICROSCOPIC-ADD ON

## 2011-05-03 LAB — CBC
HCT: 33 — ABNORMAL LOW
Hemoglobin: 10.7 — ABNORMAL LOW
MCHC: 32.3
Platelets: 276
RDW: 15.2 — ABNORMAL HIGH

## 2011-05-03 LAB — WET PREP, GENITAL
Clue Cells Wet Prep HPF POC: NONE SEEN
Trich, Wet Prep: NONE SEEN
Yeast Wet Prep HPF POC: NONE SEEN

## 2011-05-03 LAB — GC/CHLAMYDIA PROBE AMP, GENITAL
Chlamydia, DNA Probe: NEGATIVE
GC Probe Amp, Genital: NEGATIVE

## 2011-05-03 LAB — ABO/RH: ABO/RH(D): O POS

## 2012-03-16 ENCOUNTER — Encounter (HOSPITAL_COMMUNITY): Payer: Self-pay | Admitting: *Deleted

## 2012-03-16 ENCOUNTER — Inpatient Hospital Stay (HOSPITAL_COMMUNITY): Payer: Self-pay

## 2012-03-16 ENCOUNTER — Inpatient Hospital Stay (HOSPITAL_COMMUNITY)
Admission: AD | Admit: 2012-03-16 | Discharge: 2012-03-16 | Disposition: A | Discharge status: Home / Self Care | Payer: Self-pay | Source: Ambulatory Visit | Attending: Family Medicine | Admitting: Family Medicine

## 2012-03-16 DIAGNOSIS — R109 Unspecified abdominal pain: Secondary | ICD-10-CM | POA: Insufficient documentation

## 2012-03-16 DIAGNOSIS — O021 Missed abortion: Secondary | ICD-10-CM | POA: Insufficient documentation

## 2012-03-16 DIAGNOSIS — O10019 Pre-existing essential hypertension complicating pregnancy, unspecified trimester: Secondary | ICD-10-CM | POA: Insufficient documentation

## 2012-03-16 DIAGNOSIS — O210 Mild hyperemesis gravidarum: Secondary | ICD-10-CM | POA: Insufficient documentation

## 2012-03-16 DIAGNOSIS — I10 Essential (primary) hypertension: Secondary | ICD-10-CM

## 2012-03-16 DIAGNOSIS — IMO0002 Reserved for concepts with insufficient information to code with codable children: Secondary | ICD-10-CM

## 2012-03-16 HISTORY — DX: Essential (primary) hypertension: I10

## 2012-03-16 LAB — HCG, QUANTITATIVE, PREGNANCY: hCG, Beta Chain, Quant, S: 31150 m[IU]/mL — ABNORMAL HIGH (ref <5)

## 2012-03-16 LAB — WET PREP, GENITAL
Clue Cells Wet Prep HPF POC: NONE SEEN
Yeast Wet Prep HPF POC: NONE SEEN

## 2012-03-16 LAB — URINALYSIS, ROUTINE W REFLEX MICROSCOPIC
Bilirubin Urine: NEGATIVE
Glucose, UA: NEGATIVE mg/dL
Ketones, ur: NEGATIVE mg/dL
Protein, ur: NEGATIVE mg/dL
pH: 8.5 — ABNORMAL HIGH (ref 5.0–8.0)

## 2012-03-16 IMAGING — US US OB COMP LESS 14 WK
1 series · 14 of 28 positions shown · non-contrast
Comparison: none

CLINICAL DATA: Vaginal bleeding and pain

OBSTETRIC <14 WK ULTRASOUND, TRANSVAGINAL OB US
TECHNIQUE: Transabdominal and transvaginal ultrasound was
performed for evaluation of the gestation as well as the maternal
uterus and adnexal regions.

[Series 1: us ob comp less 14 wks · 14 of 42 slices shown]
[im 2/42]
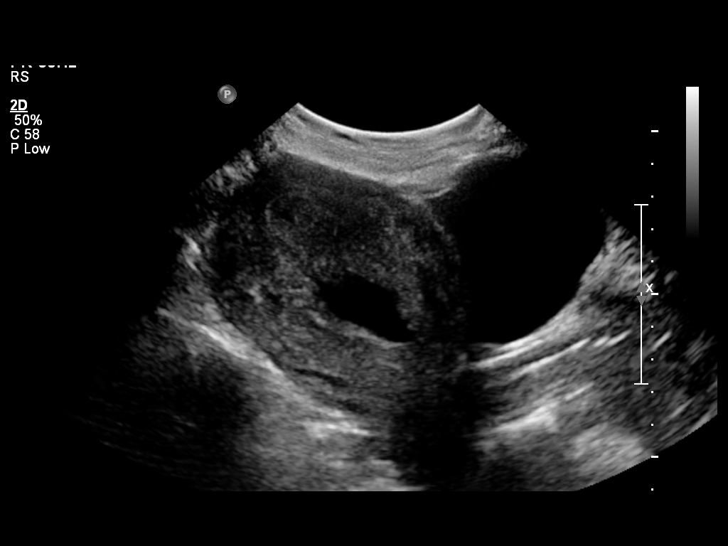
[im 5/42]
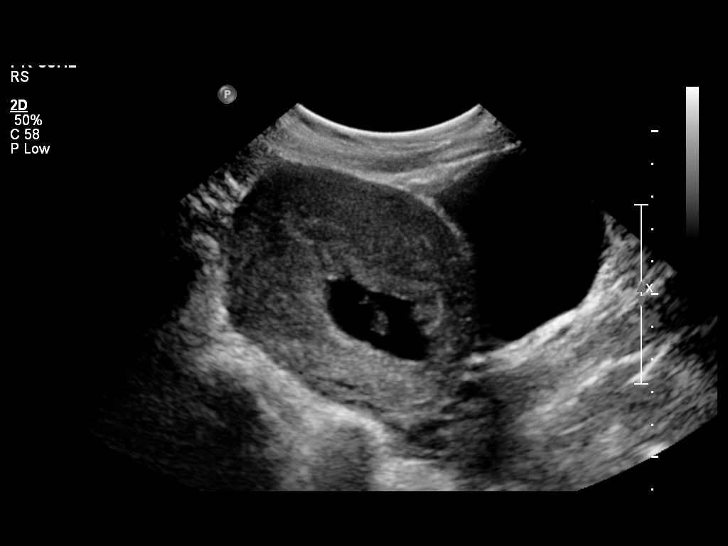
[im 8/42]
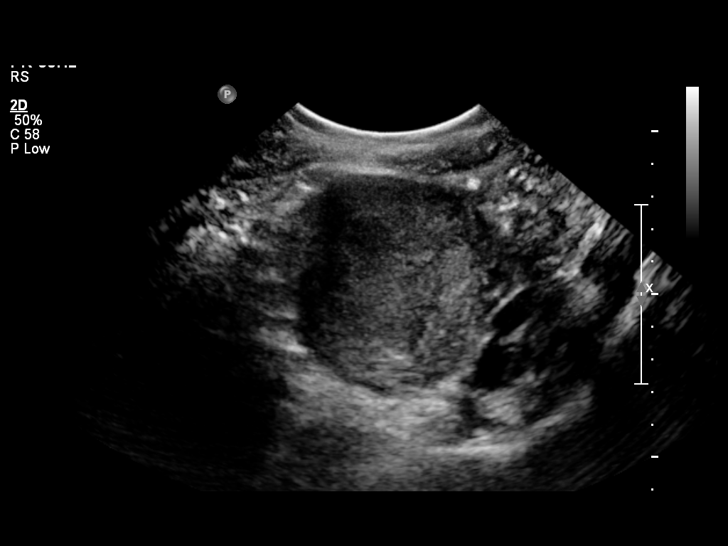
[im 11/42]
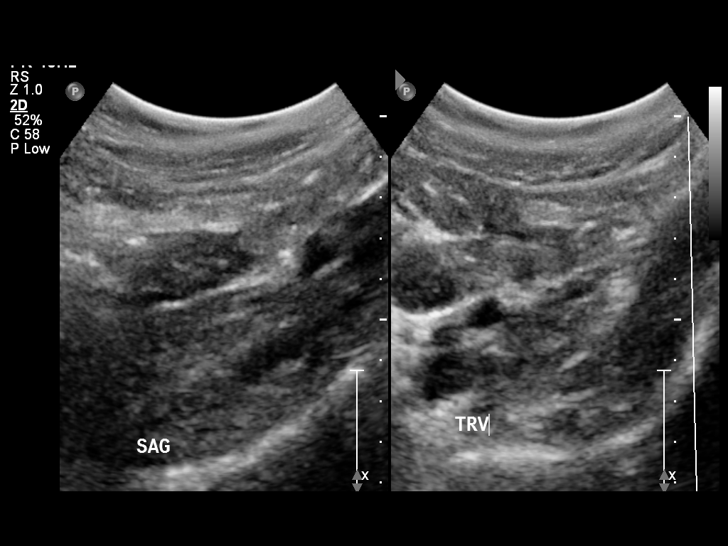
[im 14/42]
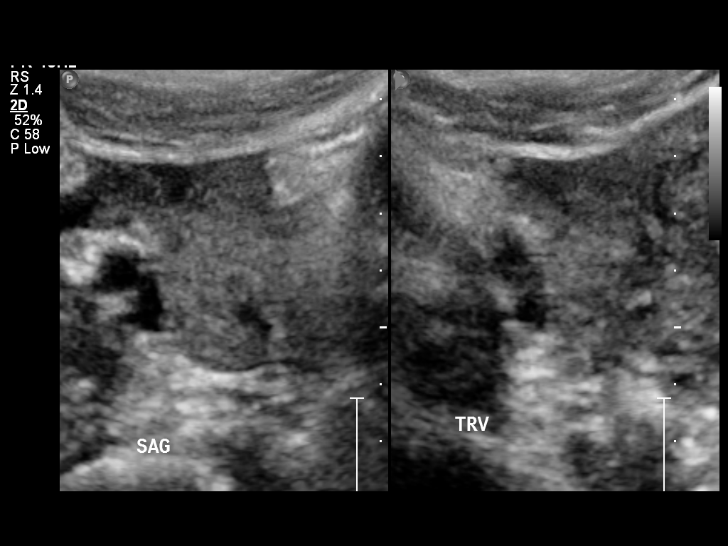
[im 17/42]
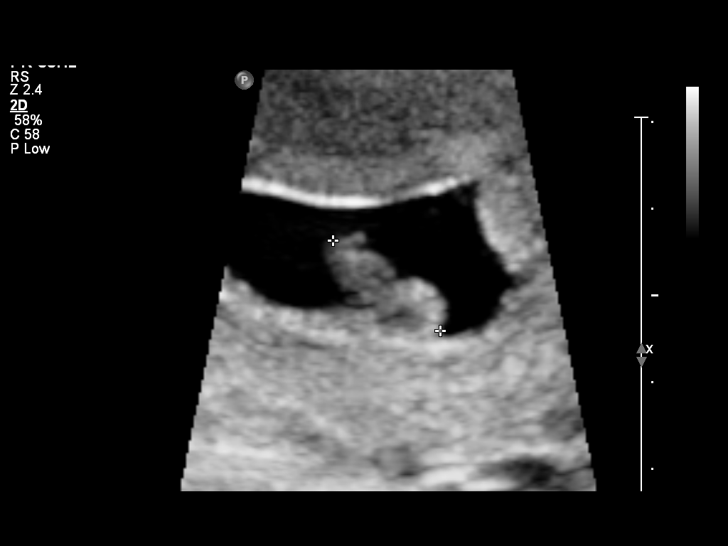
[im 20/42]
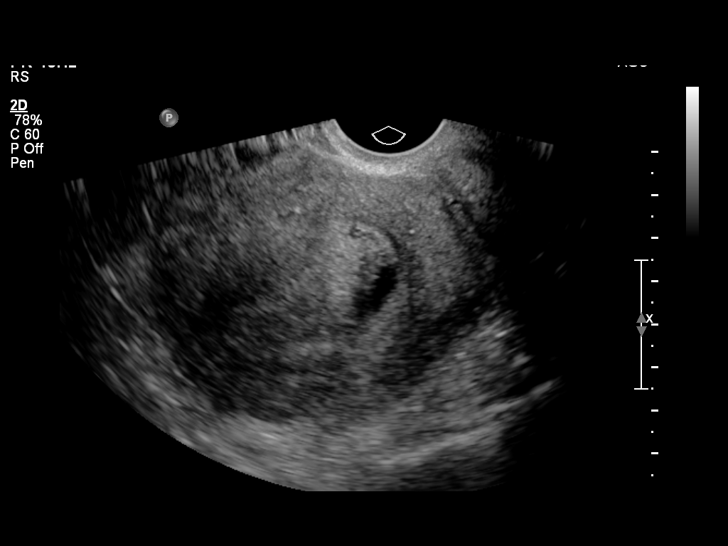
[im 23/42]
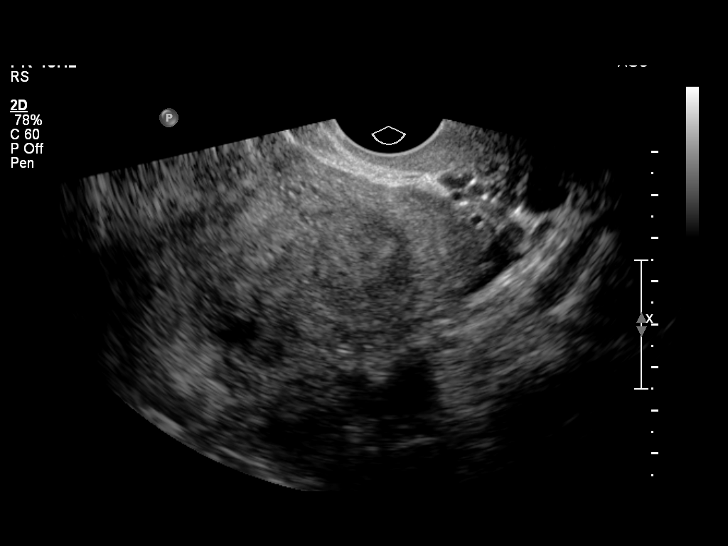
[im 26/42]
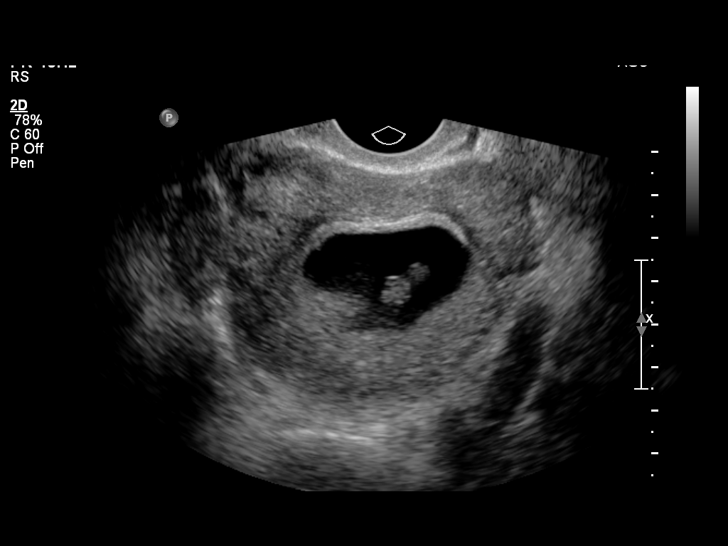
[im 29/42]
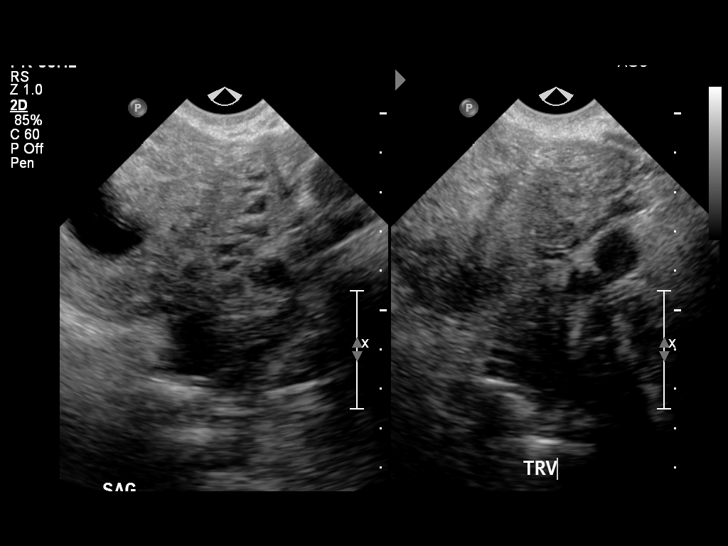
[im 32/42]
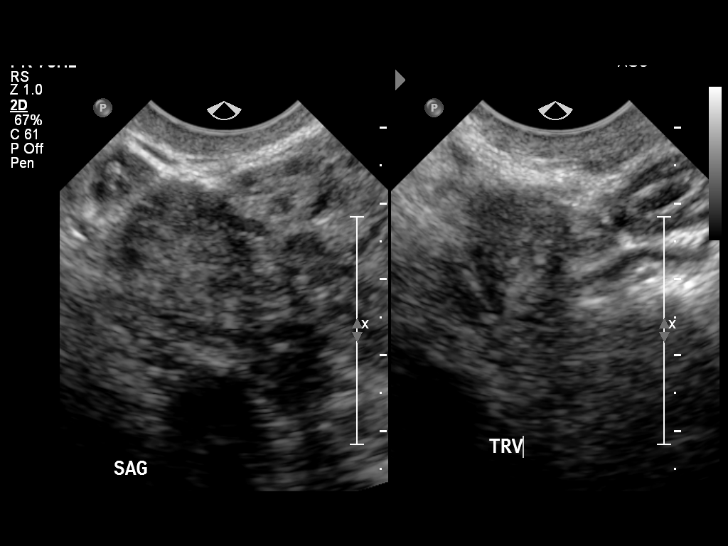
[im 35/42]
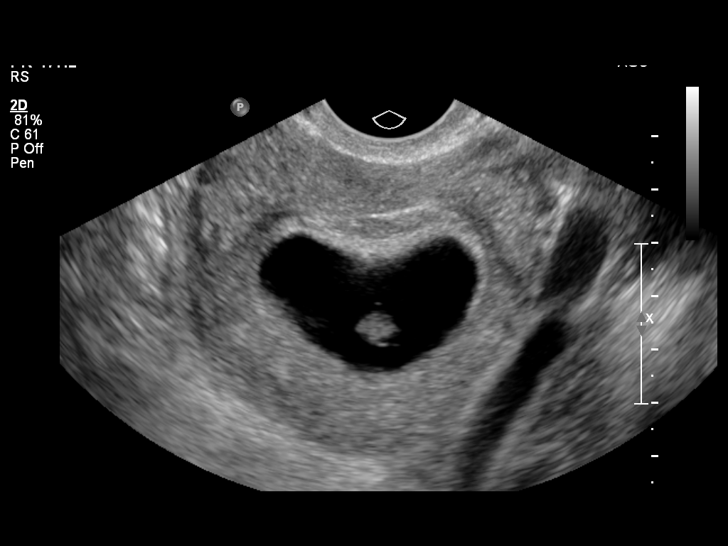
[im 38/42]
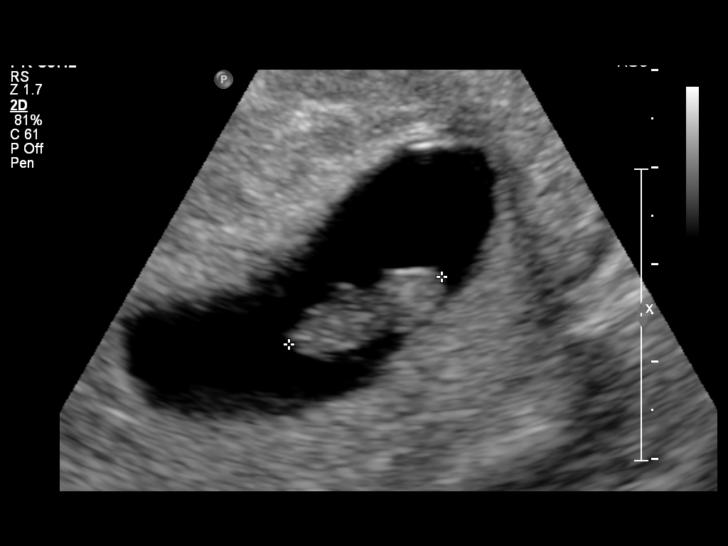
[im 42/42]
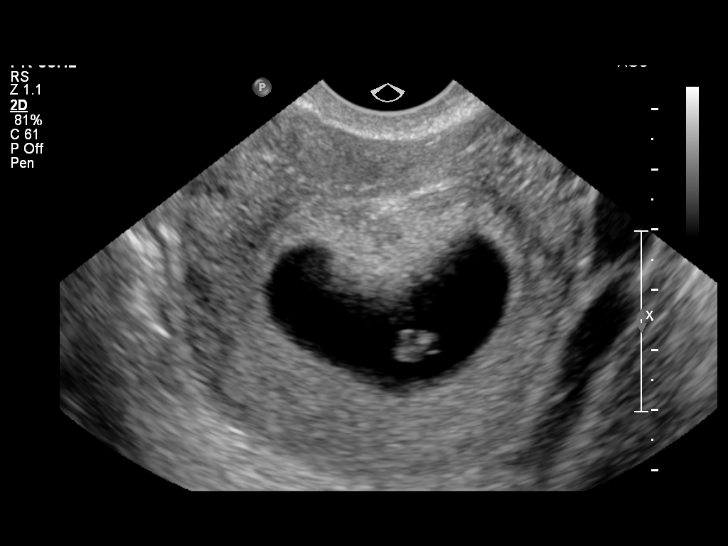

[14 of 28 positions shown; findings below may reference images not displayed]

Number of gestation:

There is a single intrauterine gestational sac containing a fetal
pole.  This is located within the lower uterine segment.  No fetal
heart rate identified.  No yolk sac is identified.

The crown-rump length equals 1.78 cm.  This corresponds to an 8-
[BZ]-day gestation.

Maternal uterus/adnexae:
No evidence for subchorionic hemorrhage.  The right ovary appears
normal.  The left ovary is normal.

Trace fluid noted within the pelvis.
IMPRESSION: 1.  Findings consistent with fetal demise.

Critical Value/emergent results were called by telephone at the
time of interpretation on [DATE] at [DATE] p.m. to RIFHAL
RIFHAL, who verbally acknowledged these results.

## 2012-03-16 MED ORDER — METOCLOPRAMIDE HCL 10 MG PO TABS
10.0000 mg | ORAL_TABLET | Freq: Once | ORAL | Status: AC
  Administered 2012-03-16: 10 mg via ORAL
  Filled 2012-03-16: qty 1

## 2012-03-16 MED ORDER — METOCLOPRAMIDE HCL 10 MG PO TABS: 10.0000 mg | ORAL_TABLET | Freq: Four times a day (QID) | ORAL | Status: DC

## 2012-03-16 MED ORDER — HYDROCHLOROTHIAZIDE 25 MG PO TABS: 25.0000 mg | ORAL_TABLET | Freq: Every day | ORAL | Status: DC

## 2012-03-16 NOTE — MAU Note (Signed)
Extreme nausea making it hard to work. 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 Some abd cramping. Nausea and cramping for 2 wks. Some pink d/c 2wks ago and then just thick white d/c

## 2012-03-16 NOTE — MAU Provider Note (Signed)
History     CSN: [id number]  Arrival date and time: 03/16/12 4098   First Provider Initiated Contact with Patient 03/16/12 2028      Chief Complaint  Patient presents with  . Morning Sickness  . Abdominal Cramping   HPI  Extreme nausea making it hard to work. 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 Some abd cramping. Nausea and cramping for 2 wks. Some pink d/c 2wks ago and then just thick white d/c     Past Medical History  Diagnosis Date  . Hypertension     Past Surgical History  Procedure Date  . Cesarean section     No family history on file.  History  Substance Use Topics  . Smoking status: Never Smoker   . Smokeless tobacco: Not on file  . Alcohol Use: No    Allergies: No Known Allergies  No prescriptions prior to admission    Review of Systems  Constitutional: Negative.   HENT: Negative.   Gastrointestinal: Positive for nausea and abdominal pain (cramping). Negative for vomiting.  Genitourinary:       Pink discharge; white discharge  All other systems reviewed and are negative.   Physical Exam   Blood pressure 170/109, pulse 104, temperature 99.3 F (37.4 C), temperature source Oral, resp. rate 20, height 5\' 5"  (1.651 m), weight 64.139 kg (141 lb 6.4 oz), last menstrual period 01/10/2012. Repeat blood pressure has varied - 138/88 and 158/88 as well as higher.  Physical Exam  Constitutional: She is oriented to person, place, and time. She appears well-developed and well-nourished.       Appears uncomfortable  HENT:  Head: Normocephalic.  Neck: Normal range of motion. Neck supple.  Cardiovascular: Normal rate, regular rhythm and normal heart sounds.   Respiratory: Effort normal and breath sounds normal.  GI: Soft. There is no tenderness.  Genitourinary: No bleeding around the vagina. Vaginal discharge (mucusy) found.  Musculoskeletal: Normal range of motion. She exhibits no edema.  Neurological: She is alert and oriented  to person, place, and time.  Skin: Skin is warm and dry.     MAU Course  Procedures Clinical Data: Vaginal bleeding and pain  OBSTETRIC <14 WK ULTRASOUND, TRANSVAGINAL OB US  Technique: Transabdominal and transvaginal ultrasound was  performed for evaluation of the gestation as well as the maternal  uterus and adnexal regions.  Number of gestation:  There is a single intrauterine gestational sac containing a fetal  pole. This is located within the lower uterine segment. No fetal  heart rate identified. No yolk sac is identified.  The crown-rump length equals 1.78 cm. This corresponds to an 8-  week-2-day gestation.  Maternal uterus/adnexae:  No evidence for subchorionic hemorrhage. The right ovary appears  normal. The left ovary is normal.  Trace fluid noted within the pelvis.  IMPRESSION:  1. Findings consistent with fetal demise.   Results for orders placed during the hospital encounter of 03/16/12 (from the past 24 hour(s))  URINALYSIS, ROUTINE W REFLEX MICROSCOPIC     Status: Abnormal   Collection Time   03/16/12  8:05 PM      Component Value Range   Color, Urine YELLOW  YELLOW   APPearance CLEAR  CLEAR   Specific Gravity, Urine 1.015  1.005 - 1.030   pH 8.5 (*) 5.0 - 8.0   Glucose, UA NEGATIVE  NEGATIVE mg/dL   Hgb urine dipstick NEGATIVE  NEGATIVE   Bilirubin Urine NEGATIVE  NEGATIVE   Ketones, ur NEGATIVE  NEGATIVE mg/dL   Protein, ur NEGATIVE  NEGATIVE mg/dL   Urobilinogen, UA 1.0  0.0 - 1.0 mg/dL   Nitrite NEGATIVE  NEGATIVE   Leukocytes, UA NEGATIVE  NEGATIVE  POCT PREGNANCY, URINE     Status: Abnormal   Collection Time   03/16/12  8:18 PM      Component Value Range   Preg Test, Ur POSITIVE (*) NEGATIVE  WET PREP, GENITAL     Status: Abnormal   Collection Time   03/16/12  8:45 PM      Component Value Range   Yeast Wet Prep HPF POC NONE SEEN  NONE SEEN   Trich, Wet Prep NONE SEEN  NONE SEEN   Clue Cells Wet Prep HPF POC NONE SEEN  NONE SEEN   WBC, Wet Prep  HPF POC FEW (*) NONE SEEN  HCG, QUANTITATIVE, PREGNANCY     Status: Abnormal   Collection Time   03/16/12  9:03 PM      Component Value Range   hCG, Beta Chain, Quant, S 31150 (*) <5 mIU/mL   Discussed fetal demise with client.  Reviewed management options and was given written info on expectant management, cytotec, and D&C.  At this time, client chooses expectant management.    Assessment and Plan  Chronic Hypertension Affecting Pregnancy Nausea in Pregnancy Fetal demise at 8w 2d  RX HCTZ 25 mg One po q day for HTN Make an appointment with doctor to manage your blood pressure Follow up in GYN clinic.  Has not made an appointment with any private office to date.  Is awaiting Medicaid card.  Applied last week. Reglan 10 mg TID  Consult given to Dr. Shawnie Pons and agrees with plan for blood pressure management. Report given to T. Burleson who assumes care of pt.  [facility] 03/16/2012, 8:37 PM

## 2012-03-17 LAB — GC/CHLAMYDIA PROBE AMP, GENITAL: Chlamydia, DNA Probe: NEGATIVE

## 2012-03-17 NOTE — MAU Provider Note (Signed)
Chart reviewed and agree with management and plan.  

## 2012-04-05 ENCOUNTER — Ambulatory Visit (INDEPENDENT_AMBULATORY_CARE_PROVIDER_SITE_OTHER): Payer: Self-pay | Admitting: Obstetrics and Gynecology

## 2012-04-05 ENCOUNTER — Encounter: Payer: Self-pay | Admitting: Obstetrics and Gynecology

## 2012-04-05 VITALS — BP 198/129 | HR 102 | Temp 99.5°F | Ht 65.0 in | Wt 136.5 lb

## 2012-04-05 DIAGNOSIS — I87309 Chronic venous hypertension (idiopathic) without complications of unspecified lower extremity: Secondary | ICD-10-CM | POA: Insufficient documentation

## 2012-04-05 DIAGNOSIS — O039 Complete or unspecified spontaneous abortion without complication: Secondary | ICD-10-CM

## 2012-04-05 NOTE — Progress Notes (Signed)
Patient instructed to go to Urgent Care or ER of her choice today when she leaves here. States she will go to Urgent Care on [facility] where she has been before with Dr. Serita Kyle?Marland Kitchen. Stressed importance of going there to get BP under control as she is at risk for stroke. Discussed with her they will probably get her BP down under control today and start her on a medicine which she needs to take as instructed. Informed her I would sent her referral to [facility] and they will follow her BP - they should call her within a few weeks.

## 2012-04-05 NOTE — Progress Notes (Signed)
Chief Complaint: Followup from mau      SUBJECTIVE HPI: Charlene [name] is a 46 y.o. [id number] here for F/U SAB. Dx with embryonic demise at 8w 03/17/12 and elected expectant management. She had heavy bleeding and cramping beginning about a week ago and 5 days ago past large clots and what appeared to be tissue. She's now having only scant  bleeding and no abdominal pain. She has been aware of hypertension for the past 3 years but has never been on medication. Of note she was advised to start hydrochlorothiazide at her last visit to MAU but did not. She does not have a primary doctor.  Past Medical History  Diagnosis Date  . Hypertension    OB History    Grav Para Term Preterm Abortions TAB SAB Ect Mult Living   9 6 6  2  2   6      # Outc Date GA Lbr Len/2nd Wgt Sex Del Anes PTL Lv   1 TRM 3/87 [redacted]w[redacted]d  5lb14oz(2.665kg) M LTCS Spinal  Yes   Comments: Fetal distress   2 TRM 6/88 [redacted]w[redacted]d  8lb(3.629kg) M LTCS Spinal  Yes   Comments: Repeat   3 TRM 10/92 [redacted]w[redacted]d  7lb11oz(3.487kg) F VBAC   Yes   Comments: No complications   4 TRM 1/02 [redacted]w[redacted]d  7lb14oz(3.572kg) M VBAC EPI  Yes   5 TRM 6/06 [redacted]w[redacted]d  4VW(0.981XB) M VBAC EPI  Yes   Comments: No complications   6 SAB 2007           Comments: 12 wks   7 TRM 1/09 [redacted]w[redacted]d  6lb10oz(3.005kg) F VBAC EPI  Yes   Comments: No complications   8 SAB 2010           Comments: 12 wks   9 GRA            Comments: System Generated. Please review and update pregnancy details.     Past Surgical History  Procedure Date  . Cesarean section    History   Social History  . Marital Status: Single    Spouse Name: N/A    Number of Children: N/A  . Years of Education: N/A   Occupational History  . Not on file.   Social History Main Topics  . Smoking status: Never Smoker   . Smokeless tobacco: Not on file  . Alcohol Use: No  . Drug Use: No  . Sexually Active: Yes   Other Topics Concern  . Not on file   Social History Narrative  . No narrative on file    Current Outpatient Prescriptions on File Prior to Visit  Medication Sig Dispense Refill  . hydrochlorothiazide (HYDRODIURIL) 25 MG tablet Take 1 tablet (25 mg total) by mouth daily. Generic is OK  30 tablet  0  . metoCLOPramide (REGLAN) 10 MG tablet Take 1 tablet (10 mg total) by mouth every 6 (six) hours.  30 tablet  0   No Known Allergies  ROS: Pertinent items in HPI  OBJECTIVE Blood pressure 179/122, pulse 102, temperature 99.5 F (37.5 C), temperature source Oral, height 5\' 5"  (1.651 m), weight 136 lb 8 oz (61.916 kg), last menstrual period 01/10/2012, unknown if currently breastfeeding.  198/129 on recheck  GENERAL: Well-developed, well-nourished female in no acute distress.  HEENT: Normocephalic HEART: normal rate RESP: normal effort ABDOMEN: Soft, non-tender EXTREMITIES: Nontender, no edema NEURO: Alert and oriented BIMANUAL: cervix closed; scant vaginal bleeding; uterus normal size, no adnexal tenderness or masses  LAB RESULTS  No results found for this or any previous visit (from the past 24 hour(s)).  IMAGING US Ob Comp Less 14 Wks  03/16/2012  *RADIOLOGY REPORT*  Clinical Data: Vaginal bleeding and pain  OBSTETRIC <14 WK ULTRASOUND, TRANSVAGINAL OB US  Technique:  Transabdominal and transvaginal ultrasound was performed for evaluation of the gestation as well as the maternal uterus and adnexal regions.  Number of gestation:  There is a single intrauterine gestational sac containing a fetal pole.  This is located within the lower uterine segment.  No fetal heart rate identified.  No yolk sac is identified.  The [name]-rump length equals 1.78 cm.  This corresponds to an 8- week-2-day gestation.  Maternal uterus/adnexae: No evidence for subchorionic hemorrhage.  The right ovary appears normal.  The left ovary is normal.  Trace fluid noted within the pelvis.  IMPRESSION:  1.  Findings consistent with fetal demise.  Critical Value/emergent results were called by telephone at the  time of interpretation on 03/16/2012 at 10:57 p.m. to [name], who verbally acknowledged these results.   Original Report Authenticated By: Rosealee Albee, M.D.    US Ob Transvaginal 03/16/2012  *RADIOLOGY REPORT*  Clinical Data: Vaginal bleeding and pain  OBSTETRIC <14 WK ULTRASOUND, TRANSVAGINAL OB US  Technique:  Transabdominal and transvaginal ultrasound was performed for evaluation of the gestation as well as the maternal uterus and adnexal regions.  Number of gestation:  There is a single intrauterine gestational sac containing a fetal pole.  This is located within the lower uterine segment.  No fetal heart rate identified.  No yolk sac is identified.  The [name]-rump length equals 1.78 cm.  This corresponds to an 8- week-2-day gestation.  Maternal uterus/adnexae: No evidence for subchorionic hemorrhage.  The right ovary appears normal.  The left ovary is normal.  Trace fluid noted within the pelvis.  IMPRESSION:  1.  Findings consistent with fetal demise.  Critical Value/emergent results were called by telephone at the time of interpretation on 03/16/2012 at 10:57 p.m. to [name], who verbally acknowledged these results.   Original Report Authenticated By: Rosealee Albee, M.D.     ED COURSE  ASSESSMENT 1. SAB (spontaneous abortion)   2. Venous hypertension, chronic   SAB completed Severe range BP  PLAN Discharge to go directlly to Urgent Care. Patient agrees to go to Urgent Care on [facility] for further evaluation and treatment of her hypertension Referral sent to [facility] for continuing primary care Declines contraception     Danae Orleans, CNM 04/05/2012  2:07 PM

## 2014-02-21 ENCOUNTER — Observation Stay (HOSPITAL_COMMUNITY)
Admission: EM | Admit: 2014-02-21 | Discharge: 2014-02-23 | Disposition: A | Discharge status: Home / Self Care | Payer: Self-pay | Attending: Emergency Medicine | Admitting: Emergency Medicine

## 2014-02-21 ENCOUNTER — Emergency Department (HOSPITAL_COMMUNITY): Payer: Self-pay

## 2014-02-21 ENCOUNTER — Encounter (HOSPITAL_COMMUNITY): Payer: Self-pay | Admitting: Emergency Medicine

## 2014-02-21 DIAGNOSIS — R519 Headache, unspecified: Secondary | ICD-10-CM | POA: Diagnosis present

## 2014-02-21 DIAGNOSIS — R072 Precordial pain: Principal | ICD-10-CM | POA: Insufficient documentation

## 2014-02-21 DIAGNOSIS — I10 Essential (primary) hypertension: Secondary | ICD-10-CM

## 2014-02-21 DIAGNOSIS — R079 Chest pain, unspecified: Secondary | ICD-10-CM

## 2014-02-21 DIAGNOSIS — R42 Dizziness and giddiness: Secondary | ICD-10-CM | POA: Insufficient documentation

## 2014-02-21 DIAGNOSIS — R209 Unspecified disturbances of skin sensation: Secondary | ICD-10-CM | POA: Insufficient documentation

## 2014-02-21 DIAGNOSIS — R112 Nausea with vomiting, unspecified: Secondary | ICD-10-CM

## 2014-02-21 DIAGNOSIS — I160 Hypertensive urgency: Secondary | ICD-10-CM

## 2014-02-21 DIAGNOSIS — R51 Headache: Secondary | ICD-10-CM

## 2014-02-21 HISTORY — DX: Chest pain, unspecified: R07.9

## 2014-02-21 HISTORY — DX: Essential (primary) hypertension: I10

## 2014-02-21 HISTORY — DX: Nausea with vomiting, unspecified: R11.2

## 2014-02-21 HISTORY — DX: Headache, unspecified: R51.9

## 2014-02-21 LAB — URINALYSIS, ROUTINE W REFLEX MICROSCOPIC
Bilirubin Urine: NEGATIVE
Glucose, UA: NEGATIVE mg/dL
Hgb urine dipstick: NEGATIVE
Ketones, ur: 15 mg/dL — AB
LEUKOCYTES UA: NEGATIVE
NITRITE: NEGATIVE
PROTEIN: NEGATIVE mg/dL
Specific Gravity, Urine: 1.018 (ref 1.005–1.030)
Urobilinogen, UA: 0.2 mg/dL (ref 0.0–1.0)
pH: 6.5 (ref 5.0–8.0)

## 2014-02-21 LAB — I-STAT TROPONIN, ED
Troponin i, poc: 0 ng/mL (ref 0.00–0.08)
Troponin i, poc: 0 ng/mL (ref 0.00–0.08)

## 2014-02-21 LAB — BASIC METABOLIC PANEL
Anion gap: 12 (ref 5–15)
BUN: 13 mg/dL (ref 6–23)
CHLORIDE: 103 meq/L (ref 96–112)
CO2: 25 mEq/L (ref 19–32)
CREATININE: 0.79 mg/dL (ref 0.50–1.10)
Calcium: 9.2 mg/dL (ref 8.4–10.5)
GFR calc Af Amer: >90 mL/min (ref >90)
GFR calc non Af Amer: >90 mL/min (ref >90)
Glucose, Bld: 95 mg/dL (ref 70–99)
Potassium: 3.7 mEq/L (ref 3.7–5.3)
Sodium: 140 mEq/L (ref 137–147)

## 2014-02-21 LAB — CBC
HEMATOCRIT: 34.6 % — AB (ref 36.0–46.0)
HEMOGLOBIN: 11.1 g/dL — AB (ref 12.0–15.0)
MCH: 24.5 pg — ABNORMAL LOW (ref 26.0–34.0)
MCHC: 32.1 g/dL (ref 30.0–36.0)
MCV: 76.4 fL — AB (ref 78.0–100.0)
Platelets: 255 10*3/uL (ref 150–400)
RBC: 4.53 MIL/uL (ref 3.87–5.11)
RDW: 14.2 % (ref 11.5–15.5)
WBC: 6.8 10*3/uL (ref 4.0–10.5)

## 2014-02-21 LAB — D-DIMER, QUANTITATIVE (NOT AT ARMC): D DIMER QUANT: 0.4 ug{FEU}/mL (ref 0.00–0.48)

## 2014-02-21 IMAGING — CR DG CHEST 2V
2 series · 2 of 2 positions shown · non-contrast
Comparison: None.

CLINICAL DATA: Chest pain.

EXAM:
CHEST  2 VIEW

[w chest pa]
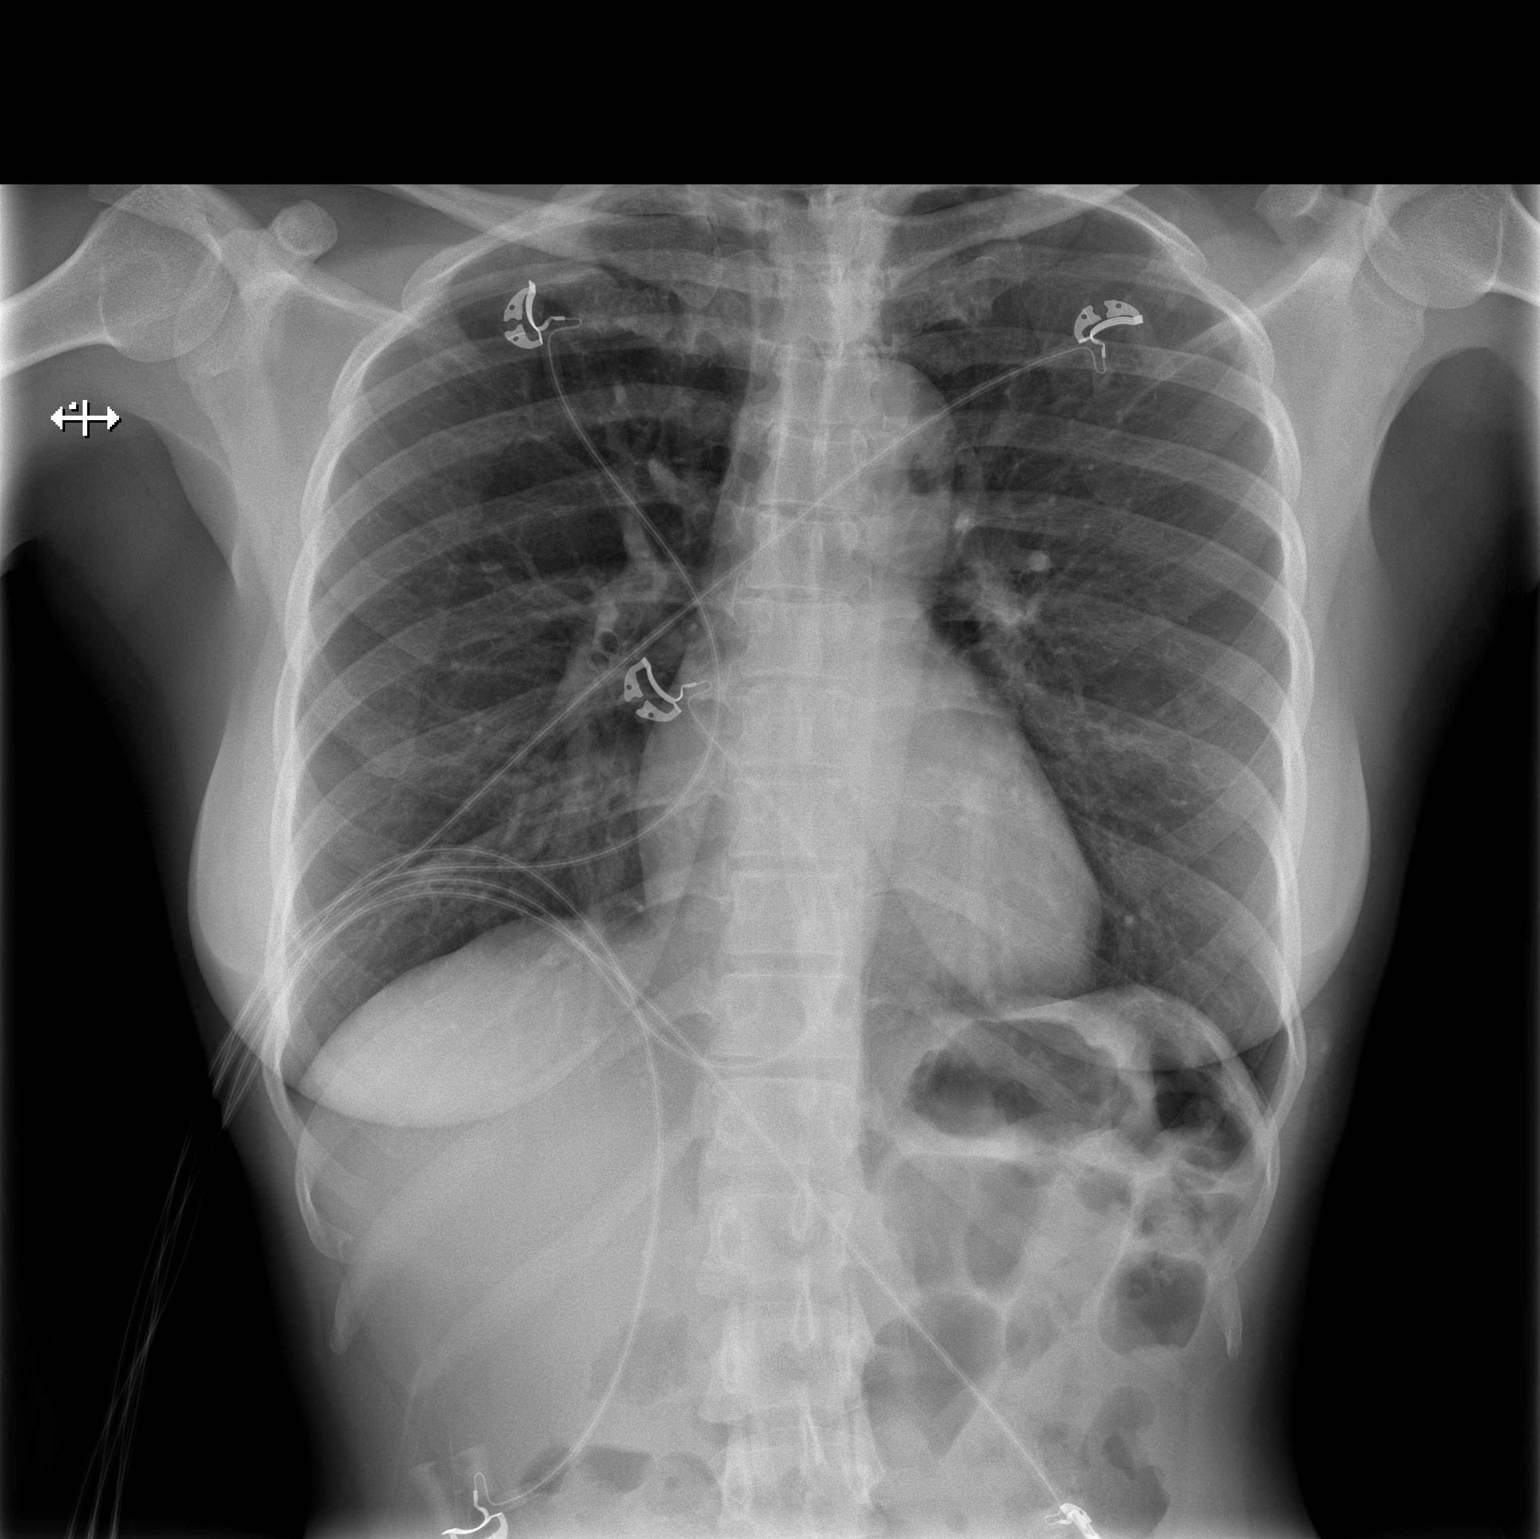

[w chest lat]
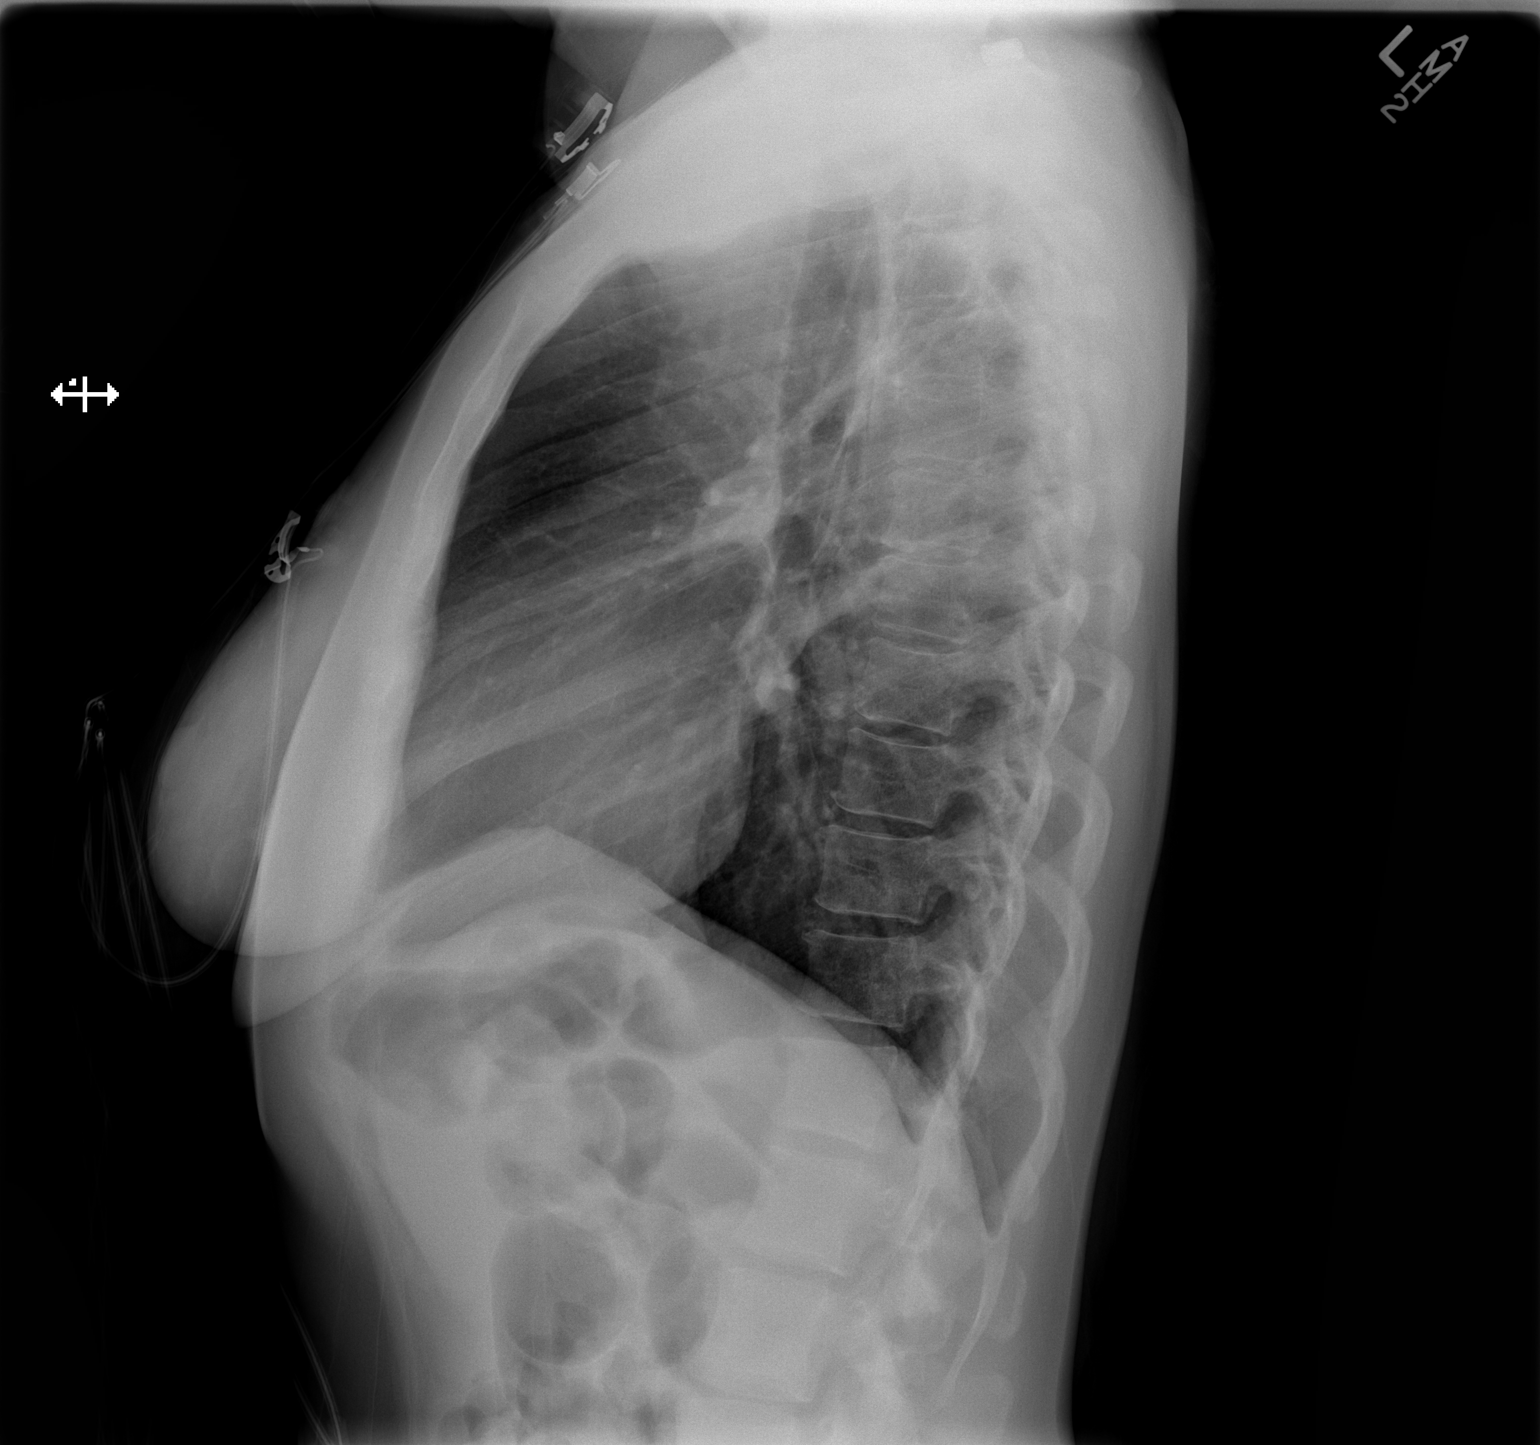

[2 of 2 positions shown; findings below may reference images not displayed]

FINDINGS: Mediastinum and hilar structures are normal. Questionable pulmonary
nodule left apex. PA chest x-ray without EKG leads suggested to
further evaluate. Lungs are clear of acute infiltrates. Heart size
normal. No pleural effusion pneumothorax. Mild degenerative changes
thoracic spine.
IMPRESSION: 1. Questionable tiny left apical pulmonary nodule. PA chest x-ray
without EKG leads suggested to further evaluate.

2.  No acute cardiopulmonary disease.

## 2014-02-21 IMAGING — CR DG CHEST 1V
1 series · 1 of 1 positions shown · non-contrast
Comparison: Chest x-ray [DATE].

CLINICAL DATA: Evaluate for pulmonary nodule.

EXAM:
CHEST - 1 VIEW

[w chest pa]
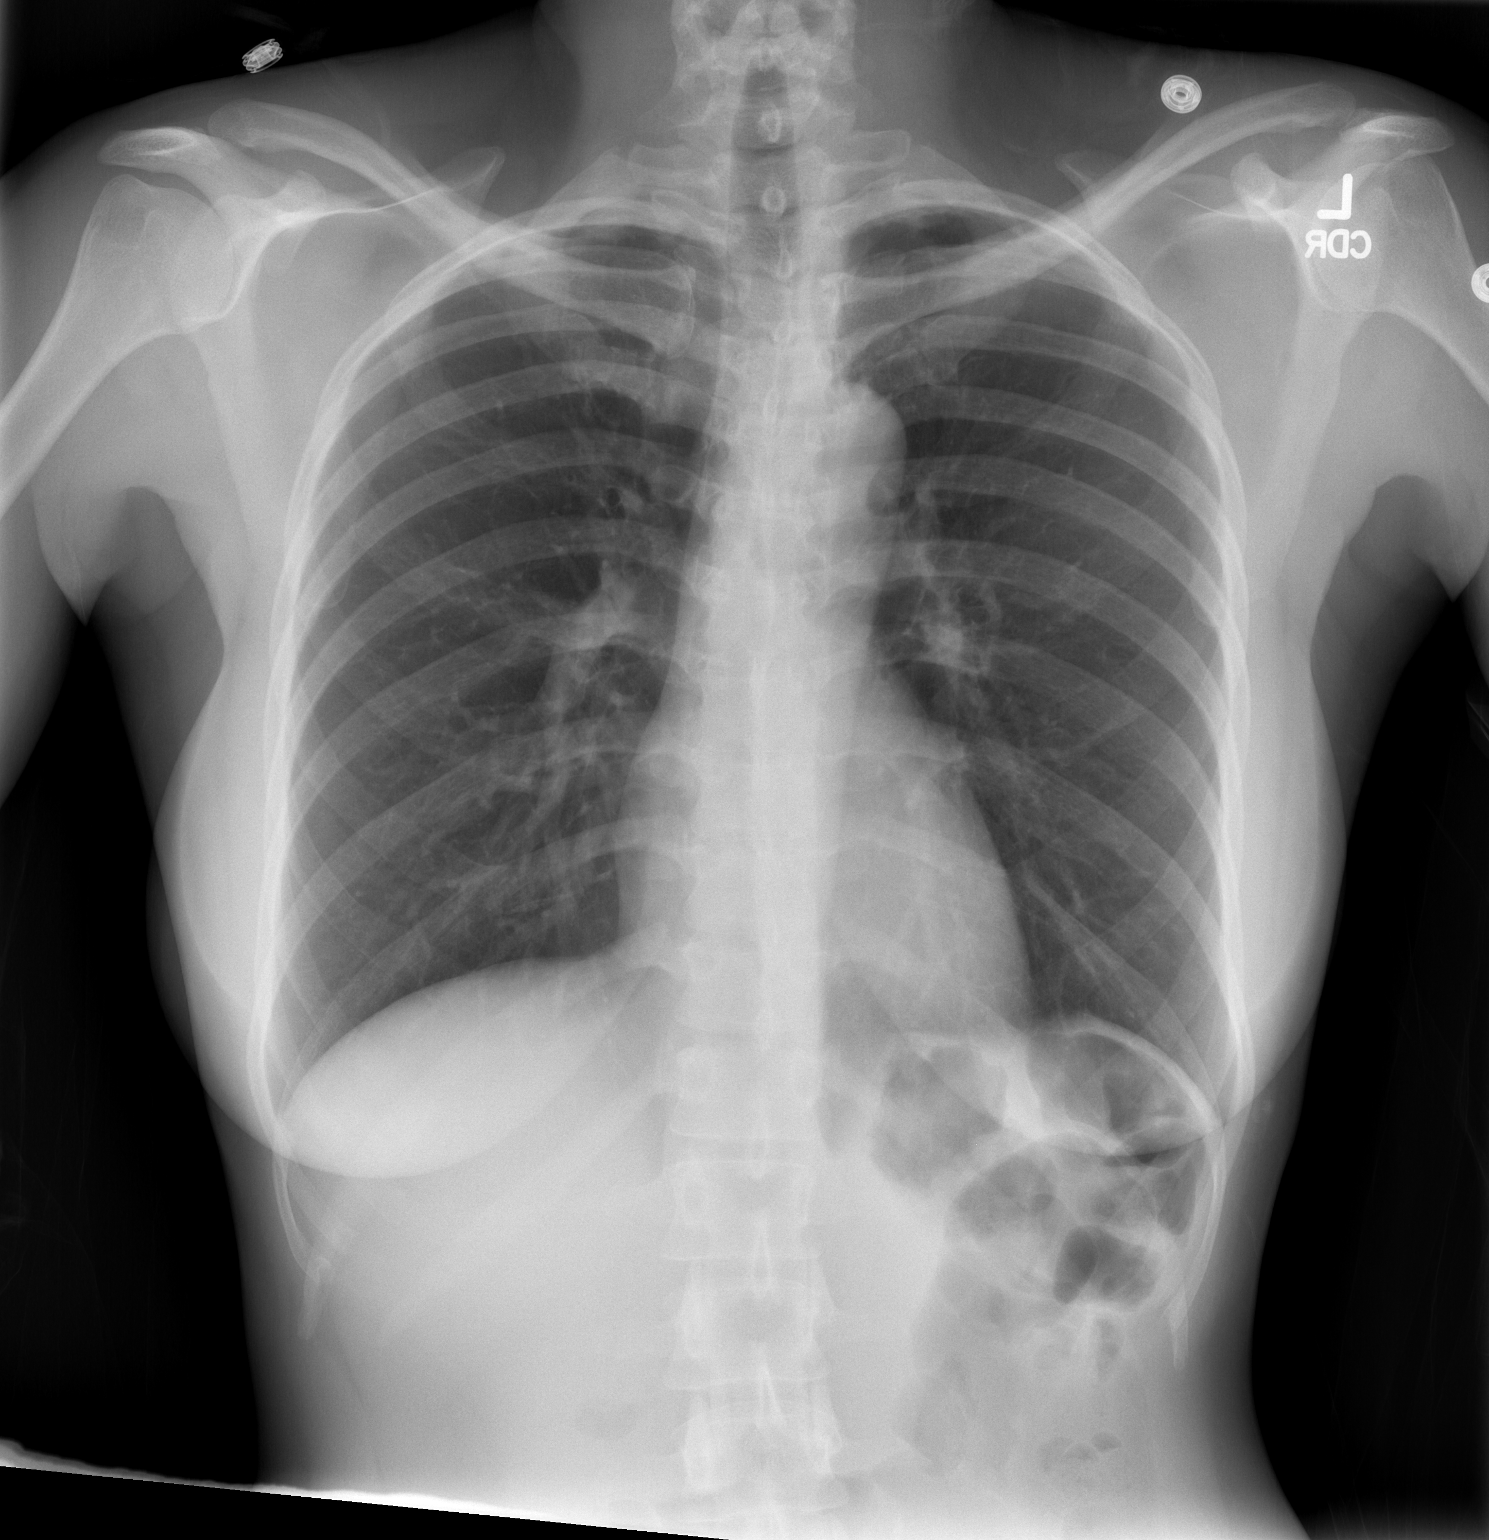

[1 of 1 positions shown; findings below may reference images not displayed]

FINDINGS: PA chest x-ray without EKG leads clears the chest. No significant
pulmonary nodule is identified. No pleural effusion or pneumothorax.
No acute cardiopulmonary disease. No acute bony abnormality.
IMPRESSION: Negative chest.  No significant pulmonary nodule identified.

## 2014-02-21 MED ORDER — ACETAMINOPHEN 325 MG PO TABS
650.0000 mg | ORAL_TABLET | ORAL | Status: DC | PRN
Start: 2014-02-21 — End: 2014-02-23
  Administered 2014-02-22: 650 mg via ORAL
  Filled 2014-02-21: qty 2

## 2014-02-21 MED ORDER — AMLODIPINE BESYLATE 5 MG PO TABS
5.0000 mg | ORAL_TABLET | Freq: Once | ORAL | Status: AC
  Administered 2014-02-21: 5 mg via ORAL
  Filled 2014-02-21: qty 1

## 2014-02-21 MED ORDER — ASPIRIN 81 MG PO CHEW
324.0000 mg | CHEWABLE_TABLET | Freq: Once | ORAL | Status: AC
  Administered 2014-02-21: 324 mg via ORAL
  Filled 2014-02-21: qty 4

## 2014-02-21 MED ORDER — ONDANSETRON HCL 4 MG/2ML IJ SOLN
4.0000 mg | Freq: Once | INTRAMUSCULAR | Status: AC
  Administered 2014-02-21: 4 mg via INTRAVENOUS
  Filled 2014-02-21: qty 2

## 2014-02-21 MED ORDER — METOPROLOL TARTRATE 25 MG PO TABS
25.0000 mg | ORAL_TABLET | Freq: Two times a day (BID) | ORAL | Status: DC
  Administered 2014-02-21 – 2014-02-23 (×4): 25 mg via ORAL
  Filled 2014-02-21 (×5): qty 1

## 2014-02-21 MED ORDER — MORPHINE SULFATE 2 MG/ML IJ SOLN: 2.0000 mg | INTRAMUSCULAR | Status: DC | PRN

## 2014-02-21 MED ORDER — ASPIRIN EC 325 MG PO TBEC
325.0000 mg | DELAYED_RELEASE_TABLET | Freq: Every day | ORAL | Status: DC
  Administered 2014-02-21: 325 mg via ORAL
  Filled 2014-02-21 (×2): qty 1

## 2014-02-21 MED ORDER — SODIUM CHLORIDE 0.9 % IV BOLUS (SEPSIS)
1000.0000 mL | Freq: Once | INTRAVENOUS | Status: AC
  Administered 2014-02-21: 1000 mL via INTRAVENOUS

## 2014-02-21 MED ORDER — GI COCKTAIL ~~LOC~~: 30.0000 mL | Freq: Four times a day (QID) | ORAL | Status: DC | PRN

## 2014-02-21 MED ORDER — HYDRALAZINE HCL 20 MG/ML IJ SOLN
10.0000 mg | Freq: Once | INTRAMUSCULAR | Status: AC
  Administered 2014-02-21: 10 mg via INTRAVENOUS
  Filled 2014-02-21: qty 1

## 2014-02-21 MED ORDER — ENOXAPARIN SODIUM 40 MG/0.4ML ~~LOC~~ SOLN
40.0000 mg | SUBCUTANEOUS | Status: DC
  Filled 2014-02-21 (×3): qty 0.4

## 2014-02-21 MED ORDER — LABETALOL HCL 5 MG/ML IV SOLN
10.0000 mg | Freq: Once | INTRAVENOUS | Status: AC
  Administered 2014-02-21: 10 mg via INTRAVENOUS
  Filled 2014-02-21: qty 4

## 2014-02-21 MED ORDER — HYDROCHLOROTHIAZIDE 12.5 MG PO CAPS
12.5000 mg | ORAL_CAPSULE | Freq: Every day | ORAL | Status: DC
  Administered 2014-02-22 – 2014-02-23 (×3): 12.5 mg via ORAL
  Filled 2014-02-21 (×2): qty 1

## 2014-02-21 MED ORDER — ONDANSETRON HCL 4 MG/2ML IJ SOLN
4.0000 mg | Freq: Four times a day (QID) | INTRAMUSCULAR | Status: DC | PRN
  Filled 2014-02-21: qty 2

## 2014-02-21 NOTE — ED Notes (Addendum)
Pt. Reports  Having a headache and not feeling well after taking hydralize.  Dr.  Perrin SmackZekowski is into talk with pt.

## 2014-02-21 NOTE — ED Provider Notes (Signed)
CSN: [id number]     Arrival date & time 02/21/14  1144 History   First MD Initiated Contact with Patient 02/21/14 1158     Chief Complaint  Patient presents with  . Chest Pain     (Consider location/radiation/quality/duration/timing/severity/associated sxs/prior Treatment) HPI Charlene [name] is a 48 y.o. female complaining of central chest pain described as aching radiating to the back onset 2 days ago becoming more severe. Associated symptoms of lightheaded sensation with bilateral toe numbness. Pain is 2/10 right now, 6/10 at worst. Pain woke her from sleep last night. Episodes last for 5-10 minutes. It is nonexertional, non-positional, nonpleuritic, not exacerbated by palpation. Denies history of DVT or PE, recent immobilization, exogenous estrogen, calf pain or leg swelling, diagnosis of hypertension, hyperlipidemia, diabetes, family history of cardiac issues, history of tobacco use, abdominal pain, nausea, vomiting, diaphoresis, change in bowel or bladder habits. Patient does not take daily aspirin.   Past Medical History  Diagnosis Date  . Hypertension    Past Surgical History  Procedure Laterality Date  . Cesarean section     History reviewed. No pertinent family history. History  Substance Use Topics  . Smoking status: Never Smoker   . Smokeless tobacco: Not on file  . Alcohol Use: No   OB History   Grav Para Term Preterm Abortions TAB SAB Ect Mult Living   9 6 6  2  2   6      Review of Systems  10 systems reviewed and found to be negative, except as noted in the HPI.   Allergies  Review of patient's allergies indicates no known allergies.  Home Medications   Prior to Admission medications   Not on File   BP 169/111  Pulse 89  Temp(Src) 98.4 F (36.9 C) (Oral)  Resp 19  SpO2 100%  LMP 01/29/2014 Physical Exam  Nursing note and vitals reviewed. Constitutional: She is oriented to person, place, and time. She appears well-developed and well-nourished. No  distress.  HENT:  Head: Normocephalic and atraumatic.  Mouth/Throat: Oropharynx is clear and moist.  Eyes: Conjunctivae and EOM are normal. Pupils are equal, round, and reactive to light.  Cardiovascular: Normal rate, regular rhythm and intact distal pulses.   Pulmonary/Chest: Effort normal and breath sounds normal. No stridor. No respiratory distress. She has no wheezes. She has no rales. She exhibits no tenderness.  Abdominal: Soft. Bowel sounds are normal. She exhibits no distension and no mass. There is no tenderness. There is no rebound and no guarding.  Musculoskeletal: Normal range of motion. She exhibits no edema and no tenderness.  No calf asymmetry, superficial collaterals, palpable cords, edema, Homans sign negative bilaterally.    Neurological: She is alert and oriented to person, place, and time.  Psychiatric: She has a normal mood and affect.    ED Course  Procedures (including critical care time) Labs Review Labs Reviewed  CBC - Abnormal; Notable for the following:    Hemoglobin 11.1 (*)    HCT 34.6 (*)    MCV 76.4 (*)    MCH 24.5 (*)    All other components within normal limits  BASIC METABOLIC PANEL  D-DIMER, QUANTITATIVE  URINALYSIS, ROUTINE W REFLEX MICROSCOPIC  I-STAT TROPOININ, ED  Rosezena Sensor, ED    Imaging Review Dg Chest 1 View  02/21/2014   CLINICAL DATA:  Evaluate for pulmonary nodule.  EXAM: CHEST - 1 VIEW  COMPARISON:  Chest x-ray 01/2014.  FINDINGS: PA chest x-ray without EKG leads clears the chest.  No significant pulmonary nodule is identified. No pleural effusion or pneumothorax. No acute cardiopulmonary disease. No acute bony abnormality.  IMPRESSION: Negative chest.  No significant pulmonary nodule identified.   Electronically Signed   By: Maisie Fushomas  Register   On: 02/21/2014 15:19   Dg Chest 2 View  02/21/2014   CLINICAL DATA:  Chest pain.  EXAM: CHEST  2 VIEW  COMPARISON:  None.  FINDINGS: Mediastinum and hilar structures are normal.  Questionable pulmonary nodule left apex. PA chest x-ray without EKG leads suggested to further evaluate. Lungs are clear of acute infiltrates. Heart size normal. No pleural effusion pneumothorax. Mild degenerative changes thoracic spine.  IMPRESSION: 1. Questionable tiny left apical pulmonary nodule. PA chest x-ray without EKG leads suggested to further evaluate.  2.  No acute cardiopulmonary disease.   Electronically Signed   By: Maisie Fushomas  Register   On: 02/21/2014 13:22     EKG Interpretation   Date/Time:  Friday February 21 2014 11:48:55 EDT Ventricular Rate:  102 PR Interval:  162 QRS Duration: 96 QT Interval:  356 QTC Calculation: 463 R Axis:   77 Text Interpretation:  Sinus tachycardia Right atrial enlargement Moderate  voltage criteria for LVH, may be normal variant Borderline ECG Confirmed  by WARD,  DO, KRISTEN ([id number]) on 02/21/2014 12:00:06 PM      MDM   Final diagnoses:  Chest pain, unspecified chest pain type    Filed Vitals:   02/21/14 1504 02/21/14 1530 02/21/14 1539 02/21/14 1600  BP: 199/113 173/102 186/113 169/111  Pulse: 78 80 80 89  Temp:      TempSrc:      Resp: 14 14 14 19   SpO2: 100% 100% 100% 100%    Medications  sodium chloride 0.9 % bolus 1,000 mL (1,000 mLs Intravenous New Bag/Given 02/21/14 1631)  hydrALAZINE (APRESOLINE) injection 10 mg (not administered)  aspirin chewable tablet 324 mg (324 mg Oral Given 02/21/14 1223)  amLODipine (NORVASC) tablet 5 mg (5 mg Oral Given 02/21/14 1414)  labetalol (NORMODYNE,TRANDATE) injection 10 mg (10 mg Intravenous Given 02/21/14 1517)    Charlene [name] is a 48 y.o. female presenting with worsening substernal chest pain radiating to the back, described as achy associated with lightheadedness and bilateral toe numbness. Episodes last approximately 10 minutes. Vision is extremely elevated blood pressure, has no prior history of hypertension diagnosis however, she states that her blood pressure is always high. Does not follow  with primary care. EKG with LVH and possible right heart strain.  Troponin and d-dimer are negative. Patient shows no anemia or electrolyte abnormality. Chest x-ray shows a possible pulmonary nodule, recommend a 1 view chest without EKG leads to further evaluate.   Repeat chest x-ray shows no signs of pulmonary nodule.   Patient's blood pressure remains elevated in the ED, 5 mg of Norvasc has not improved blood pressure.  Patient is given 10 mg of labetalol IV which has also not improve blood pressure. Patient has been initially deferred any more IV blood pressure medications. Her diastolic pressure remains over 110. I've asked her to please allow us to treat this but she has acquiesced to. Patient will receive 10 mg of hydralazine IV. Case is signed out to Dr. Deretha EmoryZackowski at shift change she will reevaluate her blood pressure and determine if she is appropriate for admission for hypertensive urgency.      Wynetta Emeryicole Solash Tullo, PA-C 02/21/14 1706

## 2014-02-21 NOTE — ED Notes (Signed)
Pt in c/o central chest pain that radiates into her back since yesterday, pain has been intermittent, denies shortness of breath or n/v, reports episode of dizziness but denies any at this time

## 2014-02-21 NOTE — ED Provider Notes (Addendum)
Medical screening examination/treatment/procedure(s) were conducted as a shared visit with non-physician practitioner(s) and myself.  I personally evaluated the patient during the encounter.   EKG Interpretation   Date/Time:  Friday February 21 2014 11:48:55 EDT Ventricular Rate:  102 PR Interval:  162 QRS Duration: 96 QT Interval:  356 QTC Calculation: 463 R Axis:   77 Text Interpretation:  Sinus tachycardia Right atrial enlargement Moderate  voltage criteria for LVH, may be normal variant Borderline ECG Confirmed  by WARD,  DO, KRISTEN [phone]) on 02/21/2014 12:00:06 PM      Results for orders placed during the hospital encounter of 02/21/14  CBC      Result Value Ref Range   WBC 6.8  4.0 - 10.5 K/uL   RBC 4.53  3.87 - 5.11 MIL/uL   Hemoglobin 11.1 (*) 12.0 - 15.0 g/dL   HCT 60.4 (*) 54.0 - 98.1 %   MCV 76.4 (*) 78.0 - 100.0 fL   MCH 24.5 (*) 26.0 - 34.0 pg   MCHC 32.1  30.0 - 36.0 g/dL   RDW 19.1  47.8 - 29.5 %   Platelets 255  150 - 400 K/uL  BASIC METABOLIC PANEL      Result Value Ref Range   Sodium 140  137 - 147 mEq/L   Potassium 3.7  3.7 - 5.3 mEq/L   Chloride 103  96 - 112 mEq/L   CO2 25  19 - 32 mEq/L   Glucose, Bld 95  70 - 99 mg/dL   BUN 13  6 - 23 mg/dL   Creatinine, Ser 6.21  0.50 - 1.10 mg/dL   Calcium 9.2  8.4 - 30.8 mg/dL   GFR calc non Af Amer >90  >90 mL/min   GFR calc Af Amer >90  >90 mL/min   Anion gap 12  5 - 15  D-DIMER, QUANTITATIVE      Result Value Ref Range   D-Dimer, Quant 0.40  0.00 - 0.48 ug/mL-FEU  URINALYSIS, ROUTINE W REFLEX MICROSCOPIC      Result Value Ref Range   Color, Urine YELLOW  YELLOW   APPearance HAZY (*) CLEAR   Specific Gravity, Urine 1.018  1.005 - 1.030   pH 6.5  5.0 - 8.0   Glucose, UA NEGATIVE  NEGATIVE mg/dL   Hgb urine dipstick NEGATIVE  NEGATIVE   Bilirubin Urine NEGATIVE  NEGATIVE   Ketones, ur 15 (*) NEGATIVE mg/dL   Protein, ur NEGATIVE  NEGATIVE mg/dL   Urobilinogen, UA 0.2  0.0 - 1.0 mg/dL   Nitrite NEGATIVE   NEGATIVE   Leukocytes, UA NEGATIVE  NEGATIVE  I-STAT TROPOININ, ED      Result Value Ref Range   Troponin i, poc 0.00  0.00 - 0.08 ng/mL   Comment 3           I-STAT TROPOININ, ED      Result Value Ref Range   Troponin i, poc 0.00  0.00 - 0.08 ng/mL   Comment 3            Dg Chest 1 View  02/21/2014   CLINICAL DATA:  Evaluate for pulmonary nodule.  EXAM: CHEST - 1 VIEW  COMPARISON:  Chest x-ray 01/2014.  FINDINGS: PA chest x-ray without EKG leads clears the chest. No significant pulmonary nodule is identified. No pleural effusion or pneumothorax. No acute cardiopulmonary disease. No acute bony abnormality.  IMPRESSION: Negative chest.  No significant pulmonary nodule identified.   Electronically Signed   By: Maisie Fus  Register  On: 02/21/2014 15:19   Dg Chest 2 View  02/21/2014   CLINICAL DATA:  Chest pain.  EXAM: CHEST  2 VIEW  COMPARISON:  None.  FINDINGS: Mediastinum and hilar structures are normal. Questionable pulmonary nodule left apex. PA chest x-ray without EKG leads suggested to further evaluate. Lungs are clear of acute infiltrates. Heart size normal. No pleural effusion pneumothorax. Mild degenerative changes thoracic spine.  IMPRESSION: 1. Questionable tiny left apical pulmonary nodule. PA chest x-ray without EKG leads suggested to further evaluate.  2.  No acute cardiopulmonary disease.   Electronically Signed   By: Maisie Fushomas  Register   On: 02/21/2014 13:22    The patient presented with history of central chest pain that radiated to her back it started yesterday. Patient with extensive workup here in the emergency department including 2 troponins. And chest x-ray without any acute findings of heart attack pulmonary edema pneumothorax or pneumonia. The main problem was patient's persistent hypertension which was extremely difficult to control never really got totally under control. Patient warranted admission for hypertensive urgency patient is refusing that she understands the risk.  Patient does not have a primary care Dr. for followup. Resource guide provided. Patient also stated that she can return anytime changes her mind about the admission. Patient currently not having any severe endorgan dysfunction. No current symptoms consistent with a stroke chest pain or severe shortness of breath. Also no severe headache.  Vanetta MuldersScott Afrika Brick, MD 02/21/14 1809    Addendum: Patient is now changed her mind about leaving AMA. She is agreed to be admitted for the persistent poorly controlled blood pressure and inability to control it with hypertensive meds. Patient's diastolic blood pressures back up to 119. Hydralazine showed some signs of improvement. Patient without any end organ deficits.  Vanetta MuldersScott Cleon Signorelli, MD 02/21/14 [id number]

## 2014-02-21 NOTE — ED Notes (Signed)
Pharmacy Tech. Reported that pt. Was taking HCTZ but pt. Stopped on her own.

## 2014-02-21 NOTE — ED Notes (Signed)
Patient c/o nausea and Dr. Herma CarsonZ notified.  Order received.   Vomited approx 200ml

## 2014-02-21 NOTE — Progress Notes (Signed)
Pt refusing Lovenox as DVT prophylaxis despite education provided.

## 2014-02-21 NOTE — ED Notes (Signed)
Pt. Reports that she always had High BP, but has never taken any medications for it.

## 2014-02-21 NOTE — Discharge Instructions (Signed)
°Emergency Department Resource Guide °1) Find a Doctor and Pay Out of Pocket °Although you won't have to find out who is covered by your insurance plan, it is a good idea to ask around and get recommendations. You will then need to call the office and see if the doctor you have chosen will accept you as a new patient and what types of options they offer for patients who are self-pay. Some doctors offer discounts or will set up payment plans for their patients who do not have insurance, but you will need to ask so you aren't surprised when you get to your appointment. ° °2) Contact Your Local Health Department °Not all health departments have doctors that can see patients for sick visits, but many do, so it is worth a call to see if yours does. If you don't know where your local health department is, you can check in your phone book. The CDC also has a tool to help you locate your state's health department, and many state websites also have listings of all of their local health departments. ° °3) Find a Walk-in Clinic °If your illness is not likely to be very severe or complicated, you may want to try a walk in clinic. These are popping up all over the country in pharmacies, drugstores, and shopping centers. They're usually staffed by nurse practitioners or physician assistants that have been trained to treat common illnesses and complaints. They're usually fairly quick and inexpensive. However, if you have serious medical issues or chronic medical problems, these are probably not your best option. ° °No Primary Care Doctor: °- Call Health Connect at  832-8000 - they can help you locate a primary care doctor that  accepts your insurance, provides certain services, etc. °- Physician Referral Service- 1-800-533-3463 ° °Chronic Pain Problems: °Organization         Address  Phone   Notes  °[facility]  (336) 297-2271 Patients need to be referred by their primary care doctor.  ° °Medication  Assistance: °Organization         Address  Phone   Notes  °[place] [facility] [address] °[place], [place] [address] (336) 641-8030 --Must be a resident of [place] °-- Must have NO insurance coverage whatsoever (no Medicaid/ Medicare, etc.) °-- The pt. MUST have a primary care doctor that directs their care regularly and follows them in the community °  °[facility]  (866) 331-1348   °[employer]  (888) 892-1162   ° °Agencies that provide inexpensive medical care: °Organization         Address  Phone   Notes  °[facility]  (336) 832-8035   °[facility]    (336) 832-7272   °[facility] [address] °[place], [place] [address] (336) 832-4777   °[facility] [address], °[place] (336) 271-4999   °[facility]    (336) 373-0678   °[facility]    (336) 272-1050   °[facility] ° [address], [place] Phone:  (336) 832-4444, Fax:  (336) 832-4440 Hours of Operation:  9 am - 6 pm, M-F.  Also accepts Medicaid/Medicare and self-pay.  °[facility] ° [address], [place] Phone: (336) [phone], Fax: (336) [phone]. Hours of Operation:  8:30 am - 5:30 pm, M-F.  Also accepts Medicaid and self-pay.  °[facility] [place] [address], [place] Phone: (336) 878-6027   °[facility] [address], [place], [place] (336)723-1848, Ext. 123 Mondays & Thursdays: 7-9 AM.  First 15 patients are seen on a first come, first serve basis. °  ° °Medicaid-accepting [place] Providers: ° °Organization         Address  Phone   Notes  °[facility] [address], [place] (336) 641-2100 Also accepts self-pay patients.  °[facility] [address], [place] ° (336) 856-9996   °[facility] [address], [place]  (336) 288-8857   °[facility] [address] [place] [address], [place] (336) 299-7000   °Veita Bland [address], [place]  ° (336) 373-1557 Only accepts [place] Access Medicaid patients after they have their name applied to their card.  ° °Self-Pay (no insurance) in [place]: ° °Organization         Address  Phone   Notes  °Sickle Cell Patients, [facility] [address], [place] (336) 832-1970   °[facility] [address], [place] (336) 832-4400   °[facility] ° [address], [place] (336) 992-4800   °[facility]/Dr. Osei-Bonsu ° [address] [place] [address], [place] or [address], [place] (336) 841-8500 Phone number for both [place] and [place] locations is the same.  °[facility] [address], [place] (336) 299-0000   °[facility] [address] [place] [address], [place] or [address] (336) 852-7530 °(336) 878-2260   °[facility] [address], [place] (336) 350-1642, phone; (336) 294-5005, fax Sees patients 1st and 3rd Saturday of every month.  Must not qualify for public or private insurance (i.e. Medicaid, Medicare, [place] Health Choice, Veterans' Benefits) • Household income should be no more than 200% of the poverty level •The clinic cannot treat you if you are pregnant or think you are pregnant • Sexually transmitted diseases are not treated at the clinic.  ° ° °Dental Care: °Organization         Address  Phone  Notes  °[place] [facility] [address], [place] (336) 641-6152 Accepts children up to age 21 who are enrolled in Medicaid or [place] Health Choice; pregnant women with a Medicaid card; and children who have applied for Medicaid or [place] Health Choice, but were declined, whose parents can pay a reduced fee at time of service.  °[place]  [facility] [place]  [address], [place] (336) 641-7733 Accepts children up to age 21 who are enrolled in Medicaid or [place] Health Choice; pregnant women with a Medicaid card; and children who have applied for Medicaid or [place] Health Choice, but were declined, whose parents can pay a reduced fee at time of service.  °[facility] ° [address], [place] (336) 641-4533 Patients are seen by appointment only. Walk-ins are not accepted. [facility] will see patients 18 years of age and older. °Monday - Tuesday (8am-5pm) °Most Wednesdays (8:30-5pm) °$30 per visit, cash only  °[facility] ° [address], [place] (336) 641-4533 Patients are seen by appointment only. Walk-ins are not accepted. [facility] will see patients 18 years of age and older. °One   Wednesday Evening (Monthly: Volunteer Based).  $30 per visit, cash only  °[facility]  (919) 537-3737 for adults; Children under age 4, call Graduate Pediatric Dentistry at ([phone]) [phone]. Children aged 4-14, please call (919) [phone] to request a pediatric application. ° Dental services are provided in all areas of dental care including fillings, crowns and bridges, complete and partial dentures, implants, gum treatment, root canals, and extractions. Preventive care is also provided. Treatment is provided to both adults and children. °Patients are selected via a lottery and there is often a waiting list. °  °[facility] [address], °[place] ° (336) 763-8833 [email or web address] °  °[facility] [address], [place], [place] (336)723-1848, Ext. 123 Second and Fourth Thursday of each month, opens at 6:30 AM; Clinic ends at 9 AM.  Patients are seen on a first-come first-served basis, and a limited number are seen during each clinic.  ° °[facility] ° [address], [place], [place] (336) 723-7904    Eligibility Requirements °You must have lived in [place], [place], or [place] counties for at least the last three months. °  You cannot be eligible for state or federal sponsored healthcare insurance, including Veterans Administration, Medicaid, or Medicare. °  You generally cannot be eligible for healthcare insurance through your employer.  °  How to apply: °Eligibility screenings are held every Tuesday and Wednesday afternoon from 1:00 pm until 4:00 pm. You do not need an appointment for the interview!  °[facility] [address], [place], [place] 336-631-2330   °[facility]  336-342-8273   °[facility]  336-703-3100   °[facility]  336-570-6415   ° °Behavioral Health Resources in the Community: °Intensive Outpatient Programs °Organization         Address  Phone  Notes  °[facility] [address], [place], [place] 336-878-6098   °[facility] [address], [place], [place] 336-832-9800   °[facility] [address], [place], [place] ° 336-882-2125   °[facility] [address],  °[place], [place] 1-800-853-5163 or 336-641-4981   °Substance Abuse Resources °Organization         Address  Phone  Notes  °[facility]  336-882-2125   °[facility]  336-784-9470   °[facility]  336-285-9073   °[facility]  336-845-3988   °Residential & Outpatient Substance Abuse Program  1-800-659-3381   °Psychological Services °Organization         Address  Phone  Notes  °[facility]  336- 832-9600   °[facility]  336- 378-7881   °[facility] [address], [place] 1-800-853-5163 or 336-641-4981   ° °Mobile Crisis Teams °Organization         Address  Phone  Notes  °[facility], Mobile Crisis Care Unit  1-877-626-1772   °Assertive °[facility] ° [address]  [place], [place] 336-834-9664   °Sharon DeEsch [address] °[place] 336-554-5454   ° °Self-Help/Support Groups °Organization         Address  Phone             Notes  °[facility]  - variety of support groups  336- 373-1402 Call for more information  °Narcotics Anonymous (NA), [facility] [address], °[place]  2 meetings at this location  ° °  Residential Treatment Programs Organization         Address  Phone  Notes  [facility] [address],    [place]  1-191-478-29561-(450)[phone]   [facility]  [address], [place] [phone], [place], [place] [phone]   [facility] [address], [place] [phone] Admissions: 8am-3pm M-F  [facility] [address]    [place], [place] [phone]   [facility] [address], [place], [place] [phone]) [phone]   [facility] [address]  [place], [place] [phone]   [facility] Intensive Outpatient [address], [place], [place] [phone](941) [phone]   [facility] [address]  [place], [place] [phone] or ([phone]   [facility] [address] [place], [place] [phone] Accepts Medicaid  [facility] [address]  [place] [phone] Substance Abuse/Addiction Treatment   [facility] Organization         Address  Phone  Notes  [facility]  267-567-4443(888) 978-336-4881   Angie FavaJulie Brannon, PhD [address], [address] [place], [place]   (334)749-2171(336) [phone] or ([phone](336) [phone]   [facility]   [address] [place], [place] ([phone](336) [phone]   [facility] [address], [place], [place] [phone](336) [phone] Insurance/Medicaid/sponsorship through [facility]  [facility] [address]                                    [place], [place] [phone](336) [phone] Therapy/tele-psych/case    [facility] [address]   [place], [place] ([phone](336) [phone]    Dr. Lolly MustacheArfeen  ([phone](336) ([phone]) [phone]   [facility] [place]  [employer] [facility] 1) [address], [place] 2) [address], [place] 3)  [address], [place] [phone]) [phone](336) [phone]  [phone](336) [phone]   [facility] Child [facility] Hot[facility] 434-643-8716(336) [phone] or 309-174-6538(336) ([phone] (After Hours)      Use resource guide above to find followup follow for having your blood pressure checked and treated. We recommended admission today due to the persistent hypertension. And difficulty controlling it. If you change your mind and come back anytime and we will admit. Definitely need to return for severe headache worse chest pain trouble breathing or any strokelike symptoms.

## 2014-02-21 NOTE — H&P (Signed)
Charlene [name] is an 48 y.o. female.   Chief Complaint: Chest pain. HPI: Pt is a 48 yr old woman who states that Charlene [name] was at work at about 11:00 this morning when Charlene [name] developed chest pain. This pain was not exertional. It had no palliative or provocative factors. The pain was in the center of her chest. It did not radiate. It was aching in character and 5-710 in severity. Charlene [name] states that Charlene [name] had tingling in her toes bilaterally at the same time that the chest pain occurred. Charlene [name] states that it was not associated with shortness of breath, nausea, or diaphoresis. Charlene [name] states that the pain is gone now, but Charlene [name] doesn't know what finally made it go away. Charlene [name] has had 2 negative troponins.   When Charlene [name] arrived at the ED, the patient was found to have an extremely high blood pressure.  The patient states that Charlene [name] did not have a history of high blood pressure, but that Charlene [name] does not see a doctor on a regular basis.  The patient has received Vasotec, Labetalol, and hydralazine with only a minimal response of her blood pressure.  After Charlene [name] had the hydralazine, Charlene [name] developed a headache and became nauseated.  Charlene [name] has had emesis x 1. Charlene [name] was given an antiemetic. When I arrive to see her the patient is somnolent, but is easily arouseable and able to take part in history.  Past Medical History  Diagnosis Date  . Hypertension     Past Surgical History  Procedure Laterality Date  . Cesarean section      History reviewed. No pertinent family history. Social History:  reports that Charlene [name] has never smoked. Charlene [name] does not have any smokeless tobacco history on file. Charlene [name] reports that Charlene [name] does not drink alcohol or use illicit drugs.  Allergies: No Known Allergies   (Not in a hospital admission)  Results for orders placed during the hospital encounter of 02/21/14 (from the past 48 hour(s))  CBC     Status: Abnormal   Collection Time    02/21/14 12:18 PM      Result Value Ref Range   WBC 6.8  4.0 - 10.5 K/uL   RBC 4.53   3.87 - 5.11 MIL/uL   Hemoglobin 11.1 (*) 12.0 - 15.0 g/dL   HCT 34.6 (*) 36.0 - 46.0 %   MCV 76.4 (*) 78.0 - 100.0 fL   MCH 24.5 (*) 26.0 - 34.0 pg   MCHC 32.1  30.0 - 36.0 g/dL   RDW 14.2  11.5 - 15.5 %   Platelets 255  150 - 400 K/uL  BASIC METABOLIC PANEL     Status: None   Collection Time    02/21/14 12:18 PM      Result Value Ref Range   Sodium 140  137 - 147 mEq/L   Potassium 3.7  3.7 - 5.3 mEq/L   Chloride 103  96 - 112 mEq/L   CO2 25  19 - 32 mEq/L   Glucose, Bld 95  70 - 99 mg/dL   BUN 13  6 - 23 mg/dL   Creatinine, Ser 0.79  0.50 - 1.10 mg/dL   Calcium 9.2  8.4 - 10.5 mg/dL   GFR calc non Af Amer >90  >90 mL/min   GFR calc Af Amer >90  >90 mL/min   Comment: (NOTE)     The eGFR has been calculated using the CKD EPI equation.     This calculation has not been validated in all clinical situations.  eGFR's persistently <90 mL/min signify possible Chronic Kidney     Disease.   Anion gap 12  5 - 15  D-DIMER, QUANTITATIVE     Status: None   Collection Time    02/21/14 12:18 PM      Result Value Ref Range   D-Dimer, Quant 0.40  0.00 - 0.48 ug/mL-FEU   Comment:            AT THE INHOUSE ESTABLISHED CUTOFF     VALUE OF 0.48 ug/mL FEU,     THIS ASSAY HAS BEEN DOCUMENTED     IN THE LITERATURE TO HAVE     A SENSITIVITY AND NEGATIVE     PREDICTIVE VALUE OF AT LEAST     98 TO 99%.  THE TEST RESULT     SHOULD BE CORRELATED WITH     AN ASSESSMENT OF THE CLINICAL     PROBABILITY OF DVT / VTE.  Randolm Idol, ED     Status: None   Collection Time    02/21/14 12:28 PM      Result Value Ref Range   Troponin i, poc 0.00  0.00 - 0.08 ng/mL   Comment 3            Comment: Due to the release kinetics of cTnI,     a negative result within the first hours     of the onset of symptoms does not rule out     myocardial infarction with certainty.     If myocardial infarction is still suspected,     repeat the test at appropriate intervals.  Randolm Idol, ED     Status:  None   Collection Time    02/21/14  3:21 PM      Result Value Ref Range   Troponin i, poc 0.00  0.00 - 0.08 ng/mL   Comment 3            Comment: Due to the release kinetics of cTnI,     a negative result within the first hours     of the onset of symptoms does not rule out     myocardial infarction with certainty.     If myocardial infarction is still suspected,     repeat the test at appropriate intervals.  URINALYSIS, ROUTINE W REFLEX MICROSCOPIC     Status: Abnormal   Collection Time    02/21/14  4:29 PM      Result Value Ref Range   Color, Urine YELLOW  YELLOW   APPearance HAZY (*) CLEAR   Specific Gravity, Urine 1.018  1.005 - 1.030   pH 6.5  5.0 - 8.0   Glucose, UA NEGATIVE  NEGATIVE mg/dL   Hgb urine dipstick NEGATIVE  NEGATIVE   Bilirubin Urine NEGATIVE  NEGATIVE   Ketones, ur 15 (*) NEGATIVE mg/dL   Protein, ur NEGATIVE  NEGATIVE mg/dL   Urobilinogen, UA 0.2  0.0 - 1.0 mg/dL   Nitrite NEGATIVE  NEGATIVE   Leukocytes, UA NEGATIVE  NEGATIVE   Comment: MICROSCOPIC NOT DONE ON URINES WITH NEGATIVE PROTEIN, BLOOD, LEUKOCYTES, NITRITE, OR GLUCOSE <1000 mg/dL.   Dg Chest 1 View  02/21/2014   CLINICAL DATA:  Evaluate for pulmonary nodule.  EXAM: CHEST - 1 VIEW  COMPARISON:  Chest x-ray 01/2014.  FINDINGS: PA chest x-ray without EKG leads clears the chest. No significant pulmonary nodule is identified. No pleural effusion or pneumothorax. No acute cardiopulmonary disease. No acute bony abnormality.  IMPRESSION: Negative chest.  No  significant pulmonary nodule identified.   Electronically Signed   By: Marcello Moores  Register   On: 02/21/2014 15:19   Dg Chest 2 View  02/21/2014   CLINICAL DATA:  Chest pain.  EXAM: CHEST  2 VIEW  COMPARISON:  None.  FINDINGS: Mediastinum and hilar structures are normal. Questionable pulmonary nodule left apex. PA chest x-ray without EKG leads suggested to further evaluate. Lungs are clear of acute infiltrates. Heart size normal. No pleural effusion  pneumothorax. Mild degenerative changes thoracic spine.  IMPRESSION: 1. Questionable tiny left apical pulmonary nodule. PA chest x-ray without EKG leads suggested to further evaluate.  2.  No acute cardiopulmonary disease.   Electronically Signed   By: Marcello Moores  Register   On: 02/21/2014 13:22    Review of Systems  Constitutional: Negative for fever, chills and malaise/fatigue.  HENT: Negative for congestion and sore throat.   Eyes: Negative for blurred vision, double vision and redness.  Respiratory: Negative for cough, sputum production, shortness of breath and wheezing.   Cardiovascular: Positive for chest pain. Negative for palpitations, orthopnea, claudication, leg swelling and PND.  Gastrointestinal: Positive for heartburn and nausea. Negative for vomiting, abdominal pain, diarrhea and constipation.  Genitourinary: Negative for dysuria, urgency and hematuria.  Musculoskeletal: Negative for joint pain, myalgias and neck pain.  Skin: Negative for itching and rash.  Neurological: Positive for tingling and headaches. Negative for dizziness, sensory change, speech change, focal weakness, loss of consciousness and weakness.  Psychiatric/Behavioral: Negative for depression, suicidal ideas and substance abuse.    Blood pressure 175/109, pulse 97, temperature 98.4 F (36.9 C), temperature source Oral, resp. rate 27, last menstrual period 01/29/2014, SpO2 100.00%, unknown if currently breastfeeding. Physical Exam  Constitutional: Charlene [name] is oriented to person, place, and time. Charlene [name] appears well-developed and well-nourished. Charlene [name] appears distressed.  HENT:  Head: Normocephalic and atraumatic.  Mouth/Throat: No oropharyngeal exudate.  Eyes: Conjunctivae and EOM are normal. Pupils are equal, round, and reactive to light. Right eye exhibits no discharge. Left eye exhibits no discharge. No scleral icterus.  Neck: Normal range of motion. Neck supple. No JVD present. No tracheal deviation present. No  thyromegaly present.  Cardiovascular:  HR is tachycardic in the 110's. No murmur or ectopy is auscultated. No lateral PMI. No thrills.  Respiratory: No respiratory distress. Charlene [name] has no wheezes. Charlene [name] has no rales. Charlene [name] exhibits no tenderness.  GI: Charlene [name] exhibits no distension and no mass. There is no tenderness. There is no rebound and no guarding.  Musculoskeletal: Charlene [name] exhibits no edema and no tenderness.  Lymphadenopathy:    Charlene [name] has no cervical adenopathy.  Neurological: Charlene [name] is oriented to person, place, and time. Charlene [name] has normal reflexes. No cranial nerve deficit. Charlene [name] exhibits normal muscle tone. Coordination normal.  Pt is somnolent after having received an antiemetic. Charlene [name] is arouseable and able to give a reasonable history and Charlene [name] is able to cooperate with the physical exam.  Skin: No rash noted. No erythema. No pallor.  Psychiatric: Charlene [name] has a normal mood and affect. Her behavior is normal. Thought content normal.     Assessment/Plan 1. Chest Pain - Pt has a heart score of 2. Charlene [name] has had 2 negative troponins while in the ED. Charlene [name] will continued to be ruled out for MI with one additional troponin and an EKG in the morning.  2. Accelerated hypertension - resistant. Pt will be admitted to a telemetry floor.  Charlene [name] will receive lopressor and hydrochlorothiazide.  It is possible that Charlene [name] will require nitrates to lower her  blood pressure, but as Charlene [name] is currently complaining of a headache, I will try to avoid nitrates as these may complicate her headache. 3. Nausea and vomiting - Seems to be related to hydralazine.  I will avoid the use of this medication. Charlene [name] will receive antiemetics as necessary 4. Headache - Also seems to be related to the IV hydralazine. Charlene [name] has no neurological changes. Charlene [name] will be carefully monitored, and Charlene [name] will have pain medication available to her. Should Charlene [name] continue vomiting or begin to show any neurological changes, Charlene [name] will have a CT head.  Adric Wrede 02/21/2014, 8:36  PM

## 2014-02-21 NOTE — ED Notes (Signed)
    198/122 Left Arm 190/116 Right Arm

## 2014-02-22 DIAGNOSIS — I10 Essential (primary) hypertension: Secondary | ICD-10-CM

## 2014-02-22 DIAGNOSIS — I369 Nonrheumatic tricuspid valve disorder, unspecified: Secondary | ICD-10-CM

## 2014-02-22 LAB — TROPONIN I: Troponin I: <0.3 ng/mL (ref <0.30)

## 2014-02-22 MED ORDER — PANTOPRAZOLE SODIUM 40 MG PO TBEC
40.0000 mg | DELAYED_RELEASE_TABLET | Freq: Every day | ORAL | Status: DC
  Filled 2014-02-22: qty 1

## 2014-02-22 NOTE — Progress Notes (Signed)
[facility] PROGRESS NOTE  SHONTE BEUTLER ZOX:[id number] DOB: 02/21/1966 DOA: 02/21/2014 PCP: No primary provider on file.  Assessment/Plan: 1. accelerated hypertension: Admit to telemetry. Started her on anti hypertensive's. bp better controlled.   2. Chest pain : possibly secondary to hypertensive crisis.  2 sets of troponin's negative.  Echocardiogram ordered and pending.  EKG shows left ventricular hypertrophy and t wave abnormalities. Repeat EKG today.    DVT prophylaxis.   Code Status: full code.  Family Communication: father and husband at bedside Disposition Plan: pending.    Consultants:  none Procedures:  echocardiogram  Antibiotics:  none  HPI/Subjective: No headache, chest pain, sob, nausea, or vomiting.   Objective: Filed Vitals:   02/22/14 1551  BP: 156/99  Pulse: 71  Temp: 99.1 F (37.3 C)  Resp: 16    Intake/Output Summary (Last 24 hours) at 02/22/14 1603 Last data filed at 02/22/14 1300  Gross per 24 hour  Intake    720 ml  Output    200 ml  Net    520 ml   Filed Weights   02/21/14 2130  Weight: 60.827 kg (134 lb 1.6 oz)    Exam:   General:  Alert afebrile comfortable  Cardiovascular: s1s2  Respiratory: ctab  Abdomen: soft NT ND BS+  Musculoskeletal: no pedal edema.   Data Reviewed: Basic Metabolic Panel:  Recent Labs Lab 02/21/14 1218  NA 140  K 3.7  CL 103  CO2 25  GLUCOSE 95  BUN 13  CREATININE 0.79  CALCIUM 9.2   Liver Function Tests: No results found for this basename: AST, ALT, ALKPHOS, BILITOT, PROT, ALBUMIN,  in the last 168 hours No results found for this basename: LIPASE, AMYLASE,  in the last 168 hours No results found for this basename: AMMONIA,  in the last 168 hours CBC:  Recent Labs Lab 02/21/14 1218  WBC 6.8  HGB 11.1*  HCT 34.6*  MCV 76.4*  PLT 255   Cardiac Enzymes:  Recent Labs Lab 02/22/14 1232  TROPONINI <0.30   BNP (last 3 results) No results found for this  basename: PROBNP,  in the last 8760 hours CBG: No results found for this basename: GLUCAP,  in the last 168 hours  No results found for this or any previous visit (from the past 240 hour(s)).   Studies: Dg Chest 1 View  02/21/2014   CLINICAL DATA:  Evaluate for pulmonary nodule.  EXAM: CHEST - 1 VIEW  COMPARISON:  Chest x-ray 01/2014.  FINDINGS: PA chest x-ray without EKG leads clears the chest. No significant pulmonary nodule is identified. No pleural effusion or pneumothorax. No acute cardiopulmonary disease. No acute bony abnormality.  IMPRESSION: Negative chest.  No significant pulmonary nodule identified.   Electronically Signed   By: Maisie Fus  Register   On: 02/21/2014 15:19   Dg Chest 2 View  02/21/2014   CLINICAL DATA:  Chest pain.  EXAM: CHEST  2 VIEW  COMPARISON:  None.  FINDINGS: Mediastinum and hilar structures are normal. Questionable pulmonary nodule left apex. PA chest x-ray without EKG leads suggested to further evaluate. Lungs are clear of acute infiltrates. Heart size normal. No pleural effusion pneumothorax. Mild degenerative changes thoracic spine.  IMPRESSION: 1. Questionable tiny left apical pulmonary nodule. PA chest x-ray without EKG leads suggested to further evaluate.  2.  No acute cardiopulmonary disease.   Electronically Signed   By: Maisie Fus  Register   On: 02/21/2014 13:22    Scheduled Meds: . aspirin EC  325 mg  Oral Daily  . enoxaparin (LOVENOX) injection  40 mg Subcutaneous Q24H  . hydrochlorothiazide  12.5 mg Oral Daily  . metoprolol tartrate  25 mg Oral BID  . [START ON 02/23/2014] pantoprazole  40 mg Oral Q0600   Continuous Infusions:   Active Problems:   Accelerated hypertension   Nausea & vomiting   Headache   Chest pain    Time spent: 35 minutes    Lineth Thielke  [facility] Pager [phone] If 7PM-7AM, please contact night-coverage at [email or web address], password [email or web address] 02/22/2014, 4:03 PM  LOS: 1 day

## 2014-02-22 NOTE — Progress Notes (Signed)
UR Completed.  Lisabeth Mian Jane [phone] 02/22/2014  

## 2014-02-22 NOTE — Progress Notes (Signed)
EKG called to M. Lynch, NP and also notified her of Pts elevated BP with meds due to be given. Will cont to monitor closely.

## 2014-02-22 NOTE — Progress Notes (Signed)
  Echocardiogram 2D Echocardiogram has been performed.  Leta JunglingCooper, Darrelle Wiberg M 02/22/2014, 4:29 PM

## 2014-02-22 NOTE — Progress Notes (Signed)
Pt continues refusing her medications to include aspirin and Hydrochlorothiazide. Education provided and printed materials about hypertensive crisis, aspirin, metoprolol, and HCTZ given.

## 2014-02-22 NOTE — ED Provider Notes (Signed)
Medical screening examination/treatment/procedure(s) were performed by non-physician practitioner and as supervising physician I was immediately available for consultation/collaboration.   EKG Interpretation   Date/Time:  Friday February 21 2014 11:48:55 EDT Ventricular Rate:  102 PR Interval:  162 QRS Duration: 96 QT Interval:  356 QTC Calculation: 463 R Axis:   77 Text Interpretation:  Sinus tachycardia Right atrial enlargement Moderate  voltage criteria for LVH, may be normal variant Borderline ECG Confirmed  by Rahul Malinak,  DO, Kyri Dai ([id number]) on 02/21/2014 12:00:06 PM        Layla MawKristen N Cammie Faulstich, DO 02/22/14 [id number]

## 2014-02-23 MED ORDER — ASPIRIN EC 81 MG PO TBEC: 81.0000 mg | DELAYED_RELEASE_TABLET | Freq: Every day | ORAL | Status: DC

## 2014-02-23 MED ORDER — METOPROLOL TARTRATE 25 MG PO TABS: 25.0000 mg | ORAL_TABLET | Freq: Two times a day (BID) | ORAL | Status: DC

## 2014-02-23 MED ORDER — HYDROCHLOROTHIAZIDE 12.5 MG PO CAPS: 12.5000 mg | ORAL_CAPSULE | Freq: Every day | ORAL | Status: DC

## 2014-02-23 NOTE — Progress Notes (Signed)
Hypertensive medications education reinforced. Pt verbalized understanding. All questions answered. Discharged home with family, ambulatory.

## 2014-02-25 NOTE — Care Management Note (Signed)
    Page 1 of 1   02/25/2014     11:57:33 AM CARE MANAGEMENT NOTE 02/25/2014  Patient:  Terrilee CroakSTEWART,Noemy T   Account Number:  [id number]  Date Initiated:  02/25/2014  Documentation initiated by:  GRAVES-BIGELOW,Mainor Hellmann  Subjective/Objective Assessment:   Pt admitted for cp.     Action/Plan:   Pt was d/c before CM able to speak to pt. CM did call pt 02-25-14 in ref to PCP. CM [facility] for pt to call for hospital f/u.   Anticipated DC Date:  02/24/2014   Anticipated DC Plan:  HOME/SELF CARE      DC Planning Services  [facility]      Choice offered to / List presented to:             Status of service:  Completed, signed off Medicare Important Message given?  NO (If response is "NO", the following Medicare IM given date fields will be blank) Date Medicare IM given:   Medicare IM given by:   Date Additional Medicare IM given:   Additional Medicare IM given by:    Discharge Disposition:  HOME/SELF CARE  Per UR Regulation:  Reviewed for med. necessity/level of care/duration of stay  If discussed at Long Length of Stay Meetings, dates discussed:    Comments:

## 2014-02-28 ENCOUNTER — Encounter: Payer: Self-pay | Admitting: Internal Medicine

## 2014-02-28 ENCOUNTER — Ambulatory Visit: Payer: Self-pay | Attending: Internal Medicine | Admitting: Internal Medicine

## 2014-02-28 VITALS — BP 170/100 | HR 81 | Temp 98.6°F | Resp 16 | Ht 65.0 in | Wt 131.0 lb

## 2014-02-28 DIAGNOSIS — I10 Essential (primary) hypertension: Secondary | ICD-10-CM | POA: Insufficient documentation

## 2014-02-28 DIAGNOSIS — R079 Chest pain, unspecified: Secondary | ICD-10-CM

## 2014-02-28 DIAGNOSIS — Z139 Encounter for screening, unspecified: Secondary | ICD-10-CM

## 2014-02-28 LAB — LIPID PANEL
CHOL/HDL RATIO: 3.7 ratio
Cholesterol: 187 mg/dL (ref 0–200)
HDL: 50 mg/dL (ref >39)
LDL CALC: 110 mg/dL — AB (ref 0–99)
Triglycerides: 136 mg/dL (ref <150)
VLDL: 27 mg/dL (ref 0–40)

## 2014-02-28 MED ORDER — METOPROLOL TARTRATE 25 MG PO TABS: 25.0000 mg | ORAL_TABLET | Freq: Two times a day (BID) | ORAL | Status: DC

## 2014-02-28 MED ORDER — HYDROCHLOROTHIAZIDE 12.5 MG PO CAPS: 12.5000 mg | ORAL_CAPSULE | Freq: Every day | ORAL | Status: DC

## 2014-02-28 NOTE — Discharge Summary (Signed)
Physician Discharge Summary  Charlene [name]:[id number] DOB: [birth year] DOA: 02/21/2014  PCP: No primary provider on file.  Admit date: 02/21/2014 Discharge date: 02/23/2014  Time spent: 30 minutes  Recommendations for Outpatient Follow-up:  1. Follow up with cardiology as recommended 2. [facility]   Discharge Diagnoses:  Active Problems:   Accelerated hypertension   Nausea & vomiting   Headache   Chest pain   Discharge Condition: improved.   Diet recommendation: low soidum diet.   Filed Weights   02/21/14 2130  Weight: 60.827 kg (134 lb 1.6 oz)    History of present illness:  Pt is a 48 yr old woman who states that she was at work at about 11:00 this morning when she developed chest pain.  When she arrived at the ED, the patient was found to have an extremely high blood pressure.    Hospital Course:  1. accelerated hypertension: Admit to telemetry. Started her on anti hypertensive's. bp better controlled.  2. Chest pain : possibly secondary to hypertensive crisis.  2 sets of troponin's negative.  Echocardiogram ordered , showed good LVEF.   EKG shows left ventricular hypertrophy and t wave abnormalities. Repeat EKG showed persistent t wave changes. Cardiology consulted, recommended stress test. But did not want to stay in the hospital to get it done. She wanted to get it done as outpatient. i told her how important it is to get the stress test done, in viw of her abnormal EKG. She reported that the EKG done previous day were not of her's. i showed her all the EKG'S and she was persistent that she does not want to get any testing done and wanted to leave the hospital. Spoke to a cardiology PA of the patient's decision and an outpatient cardiology appointment scheduled.    Procedures: Echocardiogram.   Consultations:  cardiology  Discharge Exam: Filed Vitals:   02/23/14 1046  BP: 158/97  Pulse:   Temp:   Resp:      General: alert afebrile comfortable.  Cardiovascular: s1s2 Respiratory: ctab  Discharge Instructions You were cared for by a hospitalist during your hospital stay. If you have any questions about your discharge medications or the care you received while you were in the hospital after you are discharged, you can call the unit and asked to speak with the hospitalist on call if the hospitalist that took care of you is not available. Once you are discharged, your primary care physician will handle any further medical issues. Please note that NO REFILLS for any discharge medications will be authorized once you are discharged, as it is imperative that you return to your primary care physician (or establish a relationship with a primary care physician if you do not have one) for your aftercare needs so that they can reassess your need for medications and monitor your lab values.  Discharge Instructions   Call MD for:  difficulty breathing, headache or visual disturbances    Complete by:  As directed      Call MD for:  persistant dizziness or light-headedness    Complete by:  As directed      Call MD for:  persistant nausea and vomiting    Complete by:  As directed      Diet - low sodium heart healthy    Complete by:  As directed      Discharge instructions    Complete by:  As directed   Follow up with  cardiology in one week.            Medication List         aspirin EC 81 MG tablet  Take 1 tablet (81 mg total) by mouth daily.       No Known Allergies     Follow-up Information   [facility] [address] [facility].   Specialty:  Cardiology   Contact information:   [address], [address] [place] [address] [phone]       The results of significant diagnostics from this hospitalization (including imaging, microbiology, ancillary and laboratory) are listed below for reference.    Significant Diagnostic Studies: Dg Chest 1  View  02/21/2014   CLINICAL DATA:  Evaluate for pulmonary nodule.  EXAM: CHEST - 1 VIEW  COMPARISON:  Chest x-ray 01/2014.  FINDINGS: PA chest x-ray without EKG leads clears the chest. No significant pulmonary nodule is identified. No pleural effusion or pneumothorax. No acute cardiopulmonary disease. No acute bony abnormality.  IMPRESSION: Negative chest.  No significant pulmonary nodule identified.   Electronically Signed   By: Maisie Fushomas  Register   On: 02/21/2014 15:19   Dg Chest 2 View  02/21/2014   CLINICAL DATA:  Chest pain.  EXAM: CHEST  2 VIEW  COMPARISON:  None.  FINDINGS: Mediastinum and hilar structures are normal. Questionable pulmonary nodule left apex. PA chest x-ray without EKG leads suggested to further evaluate. Lungs are clear of acute infiltrates. Heart size normal. No pleural effusion pneumothorax. Mild degenerative changes thoracic spine.  IMPRESSION: 1. Questionable tiny left apical pulmonary nodule. PA chest x-ray without EKG leads suggested to further evaluate.  2.  No acute cardiopulmonary disease.   Electronically Signed   By: Maisie Fushomas  Register   On: 02/21/2014 13:22    Microbiology: No results found for this or any previous visit (from the past 240 hour(s)).   Labs: Basic Metabolic Panel: No results found for this basename: NA, K, CL, CO2, GLUCOSE, BUN, CREATININE, CALCIUM, MG, PHOS,  in the last 168 hours Liver Function Tests: No results found for this basename: AST, ALT, ALKPHOS, BILITOT, PROT, ALBUMIN,  in the last 168 hours No results found for this basename: LIPASE, AMYLASE,  in the last 168 hours No results found for this basename: AMMONIA,  in the last 168 hours CBC: No results found for this basename: WBC, NEUTROABS, HGB, HCT, MCV, PLT,  in the last 168 hours Cardiac Enzymes:  Recent Labs Lab 02/22/14 1232  TROPONINI <0.30   BNP: BNP (last 3 results) No results found for this basename: PROBNP,  in the last 8760 hours CBG: No results found for this basename:  GLUCAP,  in the last 168 hours     Signed:  Takahiro Godinho  [facility] 02/28/2014, 8:57 PM

## 2014-02-28 NOTE — Progress Notes (Signed)
Patient Demographics  Charlene [name], is a 48 y.o. female  RUE:[id number]  JYN:[id number]  DOB - 11-23-1965  CC:  Chief Complaint  Patient presents with  . Hospitalization Follow-up  . Hypertension       HPI: Charlene [name] is a 48 y.o. female here today to establish medical care.She was recently hospitalized with symptoms of chest pain, EMR reviewed patient was in hypertensive urgency/emergency, her blood pressure was treated with her IV Vasotec labetalol hydralazine, her troponins were negative ACS ruled out, patient had echocardiogram done which reported ejection fraction of 60-65%, patient was discharged with the medication of hydrochlorthiazide and metoprolol as well as aspirin, also advised to follow with cardiology. Her blood pressure is elevated, she has not taken her blood pressure medication today, she was offered clonidine but she refused, currently denies any headache dizziness chest and shortness of breath. Patient has No headache, No chest pain, No abdominal pain - No Nausea, No new weakness tingling or numbness, No Cough - SOB.  No Known Allergies Past Medical History  Diagnosis Date  . Hypertension    Current Outpatient Prescriptions on File Prior to Visit  Medication Sig Dispense Refill  . aspirin EC 81 MG tablet Take 1 tablet (81 mg total) by mouth daily.  30 tablet  0   No current facility-administered medications on file prior to visit.   Family History  Problem Relation Age of Onset  . Hypertension Father   . Cancer Maternal Grandmother   . Thyroid disease Paternal Uncle    History   Social History  . Marital Status: Single    Spouse Name: N/A    Number of Children: N/A  . Years of Education: N/A   Occupational History  . Not on file.   Social History Main Topics  . Smoking status: Never Smoker   . Smokeless tobacco: Not on file  . Alcohol Use: No  . Drug Use: No  . Sexual Activity: Yes   Other Topics Concern  . Not on file   Social  History Narrative  . No narrative on file    Review of Systems: Constitutional: Negative for fever, chills, diaphoresis, activity change, appetite change and fatigue. HENT: Negative for ear pain, nosebleeds, congestion, facial swelling, rhinorrhea, neck pain, neck stiffness and ear discharge.  Eyes: Negative for pain, discharge, redness, itching and visual disturbance. Respiratory: Negative for cough, choking, chest tightness, shortness of breath, wheezing and stridor.  Cardiovascular: Negative for chest pain, palpitations and leg swelling. Gastrointestinal: Negative for abdominal distention. Genitourinary: Negative for dysuria, urgency, frequency, hematuria, flank pain, decreased urine volume, difficulty urinating and dyspareunia.  Musculoskeletal: Negative for back pain, joint swelling, arthralgia and gait problem. Neurological: Negative for dizziness, tremors, seizures, syncope, facial asymmetry, speech difficulty, weakness, light-headedness, numbness and headaches.  Hematological: Negative for adenopathy. Does not bruise/bleed easily. Psychiatric/Behavioral: Negative for hallucinations, behavioral problems, confusion, dysphoric mood, decreased concentration and agitation.    Objective:   Filed Vitals:   02/28/14 1027  BP: 170/100  Pulse:   Temp:   Resp:     Physical Exam: Constitutional: Patient appears well-developed and well-nourished. No distress. HENT: Normocephalic, atraumatic, External right and left ear normal. Oropharynx is clear and moist.  Eyes: Conjunctivae and EOM are normal. PERRLA, no scleral icterus. Neck: Normal ROM. Neck supple. No JVD. No tracheal deviation. No thyromegaly. CVS: RRR, S1/S2 +, no murmurs, no gallops, no carotid bruit.  Pulmonary: Effort and breath sounds normal, no stridor, rhonchi, wheezes, rales.  Abdominal: Soft.  BS +, no distension, tenderness, rebound or guarding.  Musculoskeletal: Normal range of motion. No edema and no tenderness.    Neuro: Alert. Normal reflexes, muscle tone coordination. No cranial nerve deficit. Skin: Skin is warm and dry. No rash noted. Not diaphoretic. No erythema. No pallor. Psychiatric: Normal mood and affect. Behavior, judgment, thought content normal.  Lab Results  Component Value Date   WBC 6.8 02/21/2014   HGB 11.1* 02/21/2014   HCT 34.6* 02/21/2014   MCV 76.4* 02/21/2014   PLT 255 02/21/2014   Lab Results  Component Value Date   CREATININE 0.79 02/21/2014   BUN 13 02/21/2014   NA 140 02/21/2014   K 3.7 02/21/2014   CL 103 02/21/2014   CO2 25 02/21/2014    No results found for this basename: HGBA1C   Lipid Panel  No results found for this basename: chol,  trig,  hdl,  cholhdl,  vldl,  ldlcalc       Assessment and plan:   1. Essential hypertension, benign Blood pressure is elevated since she has not taken her medication her manual blood pressure is 170/100, she will continue with her medications also advise for DASH diet. Patient will come back in 2 weeks for BP recheck.  - hydrochlorothiazide (MICROZIDE) 12.5 MG capsule; Take 1 capsule (12.5 mg total) by mouth daily.  Dispense: 30 capsule; Refill: 3 - metoprolol tartrate (LOPRESSOR) 25 MG tablet; Take 1 tablet (25 mg total) by mouth 2 (two) times daily.  Dispense: 60 tablet; Refill: 3  2. Screening Ordered baseline blood work. - TSH - Lipid panel - Vit D  25 hydroxy (rtn osteoporosis monitoring) - MM DIGITAL SCREENING BILATERAL; Future - Ambulatory referral to Obstetrics / Gynecology  3. Chest pain, unspecified chest pain type Currently asymptomatic, patient is scheduled to follow with cardiology.     Health Maintenance -Pap Smear: referred to GYN -Mammogram: ordered    Return in about 3 months (around 05/31/2014) for hypertension, BP check in 2 weeks/Nurse Visit.   Doris Cheadle, MD

## 2014-02-28 NOTE — Progress Notes (Signed)
Patient here today regarding her hypertension.  She was hospitalized on 02/21/2014 for this.

## 2014-02-28 NOTE — Patient Instructions (Signed)
DASH Eating Plan °DASH stands for "Dietary Approaches to Stop Hypertension." The DASH eating plan is a healthy eating plan that has been shown to reduce high blood pressure (hypertension). Additional health benefits may include reducing the risk of type 2 diabetes mellitus, heart disease, and stroke. The DASH eating plan may also help with weight loss. °WHAT DO I NEED TO KNOW ABOUT THE DASH EATING PLAN? °For the DASH eating plan, you will follow these general guidelines: °· Choose foods with a percent daily value for sodium of less than 5% (as listed on the food label). °· Use salt-free seasonings or herbs instead of table salt or sea salt. °· Check with your health care provider or pharmacist before using salt substitutes. °· Eat lower-sodium products, often labeled as "lower sodium" or "no salt added." °· Eat fresh foods. °· Eat more vegetables, fruits, and low-fat dairy products. °· Choose whole grains. Look for the word "whole" as the first word in the ingredient list. °· Choose fish and skinless chicken or turkey more often than red meat. Limit fish, poultry, and meat to 6 oz (170 g) each day. °· Limit sweets, desserts, sugars, and sugary drinks. °· Choose heart-healthy fats. °· Limit cheese to 1 oz (28 g) per day. °· Eat more home-cooked food and less restaurant, buffet, and fast food. °· Limit fried foods. °· Cook foods using methods other than frying. °· Limit canned vegetables. If you do use them, rinse them well to decrease the sodium. °· When eating at a restaurant, ask that your food be prepared with less salt, or no salt if possible. °WHAT FOODS CAN I EAT? °Seek help from a dietitian for individual calorie needs. °Grains °Whole grain or whole wheat bread. Brown rice. Whole grain or whole wheat pasta. Quinoa, bulgur, and whole grain cereals. Low-sodium cereals. Corn or whole wheat flour tortillas. Whole grain cornbread. Whole grain crackers. Low-sodium crackers. °Vegetables °Fresh or frozen vegetables  (raw, steamed, roasted, or grilled). Low-sodium or reduced-sodium tomato and vegetable juices. Low-sodium or reduced-sodium tomato sauce and paste. Low-sodium or reduced-sodium canned vegetables.  °Fruits °All fresh, canned (in natural juice), or frozen fruits. °Meat and Other Protein Products °Ground beef (85% or leaner), grass-fed beef, or beef trimmed of fat. Skinless chicken or turkey. Ground chicken or turkey. Pork trimmed of fat. All fish and seafood. Eggs. Dried beans, peas, or lentils. Unsalted nuts and seeds. Unsalted canned beans. °Dairy °Low-fat dairy products, such as skim or 1% milk, 2% or reduced-fat cheeses, low-fat ricotta or cottage cheese, or plain low-fat yogurt. Low-sodium or reduced-sodium cheeses. °Fats and Oils °Tub margarines without trans fats. Light or reduced-fat mayonnaise and salad dressings (reduced sodium). Avocado. Safflower, olive, or canola oils. Natural peanut or almond butter. °Other °Unsalted popcorn and pretzels. °The items listed above may not be a complete list of recommended foods or beverages. Contact your dietitian for more options. °WHAT FOODS ARE NOT RECOMMENDED? °Grains °White bread. White pasta. White rice. Refined cornbread. Bagels and croissants. Crackers that contain trans fat. °Vegetables °Creamed or fried vegetables. Vegetables in a cheese sauce. Regular canned vegetables. Regular canned tomato sauce and paste. Regular tomato and vegetable juices. °Fruits °Dried fruits. Canned fruit in light or heavy syrup. Fruit juice. °Meat and Other Protein Products °Fatty cuts of meat. Ribs, chicken wings, bacon, sausage, bologna, salami, chitterlings, fatback, hot dogs, bratwurst, and packaged luncheon meats. Salted nuts and seeds. Canned beans with salt. °Dairy °Whole or 2% milk, cream, half-and-half, and cream cheese. Whole-fat or sweetened yogurt. Full-fat   cheeses or blue cheese. Nondairy creamers and whipped toppings. Processed cheese, cheese spreads, or cheese  curds. °Condiments °Onion and garlic salt, seasoned salt, table salt, and sea salt. Canned and packaged gravies. Worcestershire sauce. Tartar sauce. Barbecue sauce. Teriyaki sauce. Soy sauce, including reduced sodium. Steak sauce. Fish sauce. Oyster sauce. Cocktail sauce. Horseradish. Ketchup and mustard. Meat flavorings and tenderizers. Bouillon cubes. Hot sauce. Tabasco sauce. Marinades. Taco seasonings. Relishes. °Fats and Oils °Butter, stick margarine, lard, shortening, ghee, and bacon fat. Coconut, palm kernel, or palm oils. Regular salad dressings. °Other °Pickles and olives. Salted popcorn and pretzels. °The items listed above may not be a complete list of foods and beverages to avoid. Contact your dietitian for more information. °WHERE CAN I FIND MORE INFORMATION? °National Heart, Lung, and Blood Institute: www.nhlbi.nih.gov/health/health-topics/topics/dash/ °Document Released: 06/23/2011 Document Revised: 11/18/2013 Document Reviewed: 05/08/2013 °Exit[employer]® Patient Information ©2015 [employer]. This information is not intended to replace advice given to you by your health care provider. Make sure you discuss any questions you have with your health care provider. ° °

## 2014-03-01 LAB — TSH: TSH: 0.544 u[IU]/mL (ref 0.350–4.500)

## 2014-03-01 LAB — VITAMIN D 25 HYDROXY (VIT D DEFICIENCY, FRACTURES): VIT D 25 HYDROXY: 31 ng/mL (ref 30–89)

## 2014-03-03 ENCOUNTER — Telehealth: Payer: Self-pay | Admitting: *Deleted

## 2014-03-03 NOTE — Telephone Encounter (Signed)
Message copied by Raynelle CharyWINFREE, Arali Somera R on Mon Mar 03, 2014 11:53 AM ------      Message from: Doris CheadleADVANI, DEEPAK      Created: Mon Mar 03, 2014  9:25 AM       Call and let the Patient know that blood work is normal.       ------

## 2014-03-03 NOTE — Telephone Encounter (Signed)
Pt is aware of her lab results.  

## 2014-03-04 ENCOUNTER — Telehealth: Payer: Self-pay

## 2014-03-04 NOTE — Telephone Encounter (Signed)
Patient not available Left message with person who answered the phone For her to return our call

## 2014-03-04 NOTE — Telephone Encounter (Signed)
Message copied by Lestine MountJUAREZ, Dorris Vangorder L on Tue Mar 04, 2014 11:09 AM ------      Message from: Doris CheadleADVANI, DEEPAK      Created: Mon Mar 03, 2014  9:25 AM       Call and let the Patient know that blood work is normal.       ------

## 2014-03-10 ENCOUNTER — Ambulatory Visit: Payer: Self-pay | Admitting: *Deleted

## 2014-03-10 VITALS — BP 161/107 | HR 78 | Resp 16

## 2014-03-10 MED ORDER — HYDROCHLOROTHIAZIDE 12.5 MG PO CAPS
25.0000 mg | ORAL_CAPSULE | Freq: Every day | ORAL | Status: DC
Start: 2014-03-10 — End: 2014-03-28

## 2014-03-10 MED ORDER — METOPROLOL TARTRATE 25 MG PO TABS: 50.0000 mg | ORAL_TABLET | Freq: Two times a day (BID) | ORAL | Status: DC

## 2014-03-10 MED ORDER — CLONIDINE HCL 0.1 MG PO TABS
0.2000 mg | ORAL_TABLET | Freq: Once | ORAL | Status: AC
  Administered 2014-03-10: 0.2 mg via ORAL

## 2014-03-10 NOTE — Patient Instructions (Signed)
Your doctor has increased your Hydrochlorothiazide from 12.5 mg once a day to 25 mg once a day.  He has also increased your Metoprolol from 25 mg twice a day to 50 mg twice a day.  DASH Eating Plan DASH stands for "Dietary Approaches to Stop Hypertension." The DASH eating plan is a healthy eating plan that has been shown to reduce high blood pressure (hypertension). Additional health benefits may include reducing the risk of type 2 diabetes mellitus, heart disease, and stroke. The DASH eating plan may also help with weight loss. WHAT DO I NEED TO KNOW ABOUT THE DASH EATING PLAN? For the DASH eating plan, you will follow these general guidelines:  Choose foods with a percent daily value for sodium of less than 5% (as listed on the food label).  Use salt-free seasonings or herbs instead of table salt or sea salt.  Check with your health care provider or pharmacist before using salt substitutes.  Eat lower-sodium products, often labeled as "lower sodium" or "no salt added."  Eat fresh foods.  Eat more vegetables, fruits, and low-fat dairy products.  Choose whole grains. Look for the word "whole" as the first word in the ingredient list.  Choose fish and skinless chicken or [place] more often than red meat. Limit fish, poultry, and meat to 6 oz (170 g) each day.  Limit sweets, desserts, sugars, and sugary drinks.  Choose heart-healthy fats.  Limit cheese to 1 oz (28 g) per day.  Eat more home-cooked food and less restaurant, buffet, and fast food.  Limit fried foods.  Cook foods using methods other than frying.  Limit canned vegetables. If you do use them, rinse them well to decrease the sodium.  When eating at a restaurant, ask that your food be prepared with less salt, or no salt if possible. WHAT FOODS CAN I EAT? Seek help from a dietitian for individual calorie needs. Grains Whole grain or whole wheat bread. Brown rice. Whole grain or whole wheat pasta. Quinoa, bulgur, and  whole grain cereals. Low-sodium cereals. Corn or whole wheat flour tortillas. Whole grain cornbread. Whole grain crackers. Low-sodium crackers. Vegetables Fresh or frozen vegetables (raw, steamed, roasted, or grilled). Low-sodium or reduced-sodium tomato and vegetable juices. Low-sodium or reduced-sodium tomato sauce and paste. Low-sodium or reduced-sodium canned vegetables.  Fruits All fresh, canned (in natural juice), or frozen fruits. Meat and Other Protein Products Ground beef (85% or leaner), grass-fed beef, or beef trimmed of fat. Skinless chicken or [place]. Ground chicken or [place]. Pork trimmed of fat. All fish and seafood. Eggs. Dried beans, peas, or lentils. Unsalted nuts and seeds. Unsalted canned beans. Dairy Low-fat dairy products, such as skim or 1% milk, 2% or reduced-fat cheeses, low-fat ricotta or cottage cheese, or plain low-fat yogurt. Low-sodium or reduced-sodium cheeses. Fats and Oils Tub margarines without trans fats. Light or reduced-fat mayonnaise and salad dressings (reduced sodium). Avocado. Safflower, olive, or canola oils. Natural peanut or almond butter. Other Unsalted popcorn and pretzels. The items listed above may not be a complete list of recommended foods or beverages. Contact your dietitian for more options. WHAT FOODS ARE NOT RECOMMENDED? Grains White bread. White pasta. White rice. Refined cornbread. Bagels and croissants. Crackers that contain trans fat. Vegetables Creamed or fried vegetables. Vegetables in a cheese sauce. Regular canned vegetables. Regular canned tomato sauce and paste. Regular tomato and vegetable juices. Fruits Dried fruits. Canned fruit in light or heavy syrup. Fruit juice. Meat and Other Protein Products Fatty cuts of meat. Ribs,  chicken wings, bacon, sausage, bologna, salami, chitterlings, fatback, hot dogs, bratwurst, and packaged luncheon meats. Salted nuts and seeds. Canned beans with salt. Dairy Whole or 2% milk, cream,  half-and-half, and cream cheese. Whole-fat or sweetened yogurt. Full-fat cheeses or blue cheese. Nondairy creamers and whipped toppings. Processed cheese, cheese spreads, or cheese curds. Condiments Onion and garlic salt, seasoned salt, table salt, and sea salt. Canned and packaged gravies. Worcestershire sauce. Tartar sauce. Barbecue sauce. Teriyaki sauce. Soy sauce, including reduced sodium. Steak sauce. Fish sauce. Oyster sauce. Cocktail sauce. Horseradish. Ketchup and mustard. Meat flavorings and tenderizers. Bouillon cubes. Hot sauce. Tabasco sauce. Marinades. Taco seasonings. Relishes. Fats and Oils Butter, stick margarine, lard, shortening, ghee, and bacon fat. Coconut, palm kernel, or palm oils. Regular salad dressings. Other Pickles and olives. Salted popcorn and pretzels. The items listed above may not be a complete list of foods and beverages to avoid. Contact your dietitian for more information. WHERE CAN I FIND MORE INFORMATION? National Heart, Lung, and Blood Institute: travelstabloid.com Document Released: 06/23/2011 Document Revised: 11/18/2013 Document Reviewed: 05/08/2013 [facility] Patient Information 2015 [place], [place]. This information is not intended to replace advice given to you by your health care provider. Make sure you discuss any questions you have with your health care provider.

## 2014-03-10 NOTE — Progress Notes (Unsigned)
Patient presents to walk-in clinic c/o erratic diastolic BP readings. Patient states since Friday her diastolic BP has been between 95-127. Patient BP today is 173/117. 0.2 mg Clonidine administered per protocol. Dr. Orpah [name] made aware. Patient denies any chest pain, headaches, shortness of breath. Verbal order received from Dr. Orpah [name] to increase HCTZ from 12.5 mg daily to 25 mg daily; and increase Metoprolol from 25 mg twice daily to 50 mg twice daily, and return in two weeks for nurse visit. Patient informed regarding plan of care. Patient verbalized understanding. Patient also c/o about being anxious and wanting to discuss this with PCP. Patient given an appointment for 03/11/2014 at 0900. Annamaria Helling, RN

## 2014-03-11 ENCOUNTER — Telehealth: Payer: Self-pay | Admitting: Internal Medicine

## 2014-03-11 ENCOUNTER — Encounter: Payer: Self-pay | Admitting: Internal Medicine

## 2014-03-11 ENCOUNTER — Ambulatory Visit: Payer: Self-pay | Attending: Internal Medicine | Admitting: Internal Medicine

## 2014-03-11 VITALS — BP 160/100 | HR 80 | Temp 99.0°F | Resp 16 | Wt 133.2 lb

## 2014-03-11 DIAGNOSIS — Z8249 Family history of ischemic heart disease and other diseases of the circulatory system: Secondary | ICD-10-CM | POA: Insufficient documentation

## 2014-03-11 DIAGNOSIS — Z7982 Long term (current) use of aspirin: Secondary | ICD-10-CM | POA: Insufficient documentation

## 2014-03-11 DIAGNOSIS — F411 Generalized anxiety disorder: Secondary | ICD-10-CM | POA: Insufficient documentation

## 2014-03-11 DIAGNOSIS — I10 Essential (primary) hypertension: Secondary | ICD-10-CM | POA: Insufficient documentation

## 2014-03-11 NOTE — Telephone Encounter (Signed)
Called patient to let her know that she will need to contact her Insurance company about the status of her policy; Patient was unavailable

## 2014-03-11 NOTE — Progress Notes (Signed)
Patient states was here yesterday as a walk in for  Elevated blood pressure Today her blood pressure is still elevated after making adjustments to  Her medications  Patient is refusing catapress- states made her very tired and sleepy yesterday Does not want to take the medications and drive

## 2014-03-11 NOTE — Progress Notes (Signed)
MRN: [id number] Name: Charlene [name]  Sex: female Age: 48 y.o. DOB: 04-09-1966  Allergies: Review of patient's allergies indicates no known allergies.  Chief Complaint  Patient presents with  . Hypertension    HPI: Patient is 48 y.o. female who has recently been diagnosed with hypertension, comes today for followup as per patient when she checks blood pressure at home it is high and she gets anxious and is stressed out, she came yesterday as a walk-in and her blood pressure medication dose was increased as per patient she has started taking hydrochlorothiazide 25 mg daily as well as Lopressor 50 mg twice a day, she's trying to modify her diet, her manual blood pressure today is 160/100 denies any headache dizziness chest and shortness of breath.  Past Medical History  Diagnosis Date  . Hypertension     Past Surgical History  Procedure Laterality Date  . Cesarean section        Medication List       This list is accurate as of: 03/11/14  9:58 AM.  Always use your most recent med list.               aspirin EC 81 MG tablet  Take 1 tablet (81 mg total) by mouth daily.     hydrochlorothiazide 12.5 MG capsule  Commonly known as:  MICROZIDE  Take 2 capsules (25 mg total) by mouth daily.     metoprolol tartrate 25 MG tablet  Commonly known as:  LOPRESSOR  Take 2 tablets (50 mg total) by mouth 2 (two) times daily.        No orders of the defined types were placed in this encounter.     There is no immunization history on file for this patient.  Family History  Problem Relation Age of Onset  . Hypertension Father   . Cancer Maternal Grandmother   . Thyroid disease Paternal Uncle     History  Substance Use Topics  . Smoking status: Never Smoker   . Smokeless tobacco: Not on file  . Alcohol Use: No    Review of Systems   As noted in HPI  Filed Vitals:   03/11/14 0956  BP: 160/100  Pulse:   Temp:   Resp:     Physical Exam  Physical Exam    Constitutional: No distress.  Eyes: EOM are normal. Pupils are equal, round, and reactive to light.  Cardiovascular: Normal rate and regular rhythm.   Pulmonary/Chest: Breath sounds normal. No respiratory distress. She has no wheezes. She has no rales.  Musculoskeletal: She exhibits no edema.  Psychiatric:  Anxious, tearful     CBC    Component Value Date/Time   WBC 6.8 02/21/2014 1218   RBC 4.53 02/21/2014 1218   HGB 11.1* 02/21/2014 1218   HCT 34.6* 02/21/2014 1218   PLT 255 02/21/2014 1218   MCV 76.4* 02/21/2014 1218    CMP     Component Value Date/Time   NA 140 02/21/2014 1218   K 3.7 02/21/2014 1218   CL 103 02/21/2014 1218   CO2 25 02/21/2014 1218   GLUCOSE 95 02/21/2014 1218   BUN 13 02/21/2014 1218   CREATININE 0.79 02/21/2014 1218   CALCIUM 9.2 02/21/2014 1218   PROT 5.8* 08/10/2007 1022   ALBUMIN 2.5* 08/10/2007 1022   AST 32 08/10/2007 1022   ALT 13 08/10/2007 1022   ALKPHOS 135* 08/10/2007 1022   BILITOT 0.5 08/10/2007 1022   GFRNONAA >90 02/21/2014 1218  GFRAA >90 02/21/2014 1218    Lab Results  Component Value Date/Time   CHOL 187 02/28/2014 10:33 AM    No components found with this basename: hga1c    Lab Results  Component Value Date/Time   AST 32 08/10/2007 10:22 AM    Assessment and Plan  Essential hypertension The medication dose was just increased yesterday, I have advised patient to continue with a low-salt diet and she has already followed in 2 weeks per nurse visit for BP check.  Anxiety state, unspecified Patient does not want to take any anxiety medications, most of her anxiety symptoms are because her blood pressure is elevated, she will try relaxation technique.  Follow up as scheduled   Doris Cheadle, MD

## 2014-03-20 ENCOUNTER — Ambulatory Visit: Payer: Self-pay

## 2014-03-28 ENCOUNTER — Encounter: Payer: Self-pay | Admitting: Cardiology

## 2014-03-28 ENCOUNTER — Ambulatory Visit (INDEPENDENT_AMBULATORY_CARE_PROVIDER_SITE_OTHER): Payer: Self-pay | Admitting: Cardiology

## 2014-03-28 VITALS — BP 112/82 | HR 86 | Ht 65.0 in | Wt 130.0 lb

## 2014-03-28 DIAGNOSIS — I10 Essential (primary) hypertension: Secondary | ICD-10-CM

## 2014-03-28 DIAGNOSIS — R9431 Abnormal electrocardiogram [ECG] [EKG]: Secondary | ICD-10-CM

## 2014-03-28 DIAGNOSIS — R0789 Other chest pain: Secondary | ICD-10-CM

## 2014-03-28 NOTE — Progress Notes (Signed)
[address], [address] [place], [place]  [address] Phone: [phone] Fax:  [phone]  Date:  03/28/2014   ID:  Charlene [name], DOB July 06, 1966, MRN [id number]  PCP:  No primary provider on file.   History of Present Illness: Charlene [name] is a 48 y.o. female here for evaluation of chest pain, hypertension with recent hospitalization in August of 2015. Abnormal EKG noted throughout hospitalization, J-point elevation especially in the early precordial leads with nonspecific ST-T wave changes. Currently she is doing very well with her blood pressure on low-dose hydrochlorothiazide and metoprolol. She does state that when anxious, her blood pressure increases quite a bit.  Her chest pain episode which prompted her hospitalization began with sharp substernal pain that began through the night, went to work and she was having intermittent discomfort. When she gets the emergency room, her blood pressure was highly elevated. They were concerned, admitted her, placed her on medication. They were also concerned about her EKG which showed J-point elevation in the precordial leads, nonspecific ST-T wave changes. Multiple EKGs were done and all were fairly similar. She was chest pain-free at discharge. Troponins were negative.  Currently her blood pressure is excellent.   Wt Readings from Last 3 Encounters:  03/28/14 130 lb (58.968 kg)  03/11/14 133 lb 3.2 oz (60.419 kg)  02/28/14 131 lb (59.421 kg)     Past Medical History  Diagnosis Date  . Hypertension     Past Surgical History  Procedure Laterality Date  . Cesarean section      Current Outpatient Prescriptions  Medication Sig Dispense Refill  . hydrochlorothiazide (MICROZIDE) 12.5 MG capsule Take 12.5 mg by mouth daily.      . metoprolol tartrate (LOPRESSOR) 25 MG tablet Take 50 mg by mouth daily.       No current facility-administered medications for this visit.    Allergies:   No Known Allergies  Social  History:  The patient  reports that she has never smoked. She does not have any smokeless tobacco history on file. She reports that she does not drink alcohol or use illicit drugs.   Family History  Problem Relation Age of Onset  . Hypertension Father   . Cancer Maternal Grandmother   . Thyroid disease Paternal Uncle     ROS:  Please see the history of present illness. No further chest pain, no syncope, no bleeding. Previous miscarriage noted. No orthopnea, no PND, no strokelike symptoms.     All other systems reviewed and negative.   PHYSICAL EXAM: VS:  BP 112/82  Pulse 86  Ht  (1.651 m)  Wt 130 lb (58.968 kg)  BMI 21.63 kg/m2  LMP 03/03/2014 Well nourished, well developed, in no acute distress HEENT: normal, [place]/AT, EOMI Neck: no JVD, normal carotid upstroke, no bruit Cardiac:  normal S1, S2; RRR; no murmur Lungs:  clear to auscultation bilaterally, no wheezing, rhonchi or rales Abd: soft, nontender, no hepatomegaly, no bruits Ext: no edema, 2+ distal pulses Skin: warm and dry GU: deferred Neuro: no focal abnormalities noted, AAO x 3  EKG:   02/22/14-sinus rhythm, LVH, repolarization abnormality, nonspecific ST-T wave changes, J-point elevation in lead V2, V3. No significant change from previous. Chest x-ray: 02/21/14-normal Echocardiogram: 02/22/14-normal rhythm, EF 65% Labs: LDL 110, TSH 0.5, normal  ASSESSMENT AND PLAN:  1. Atypical chest pain-stabbing-like pain previously. Likely musculoskeletal. Currently resolved. Troponins were normal. EKG unchanged throughout admission demonstrating repolarization abnormalities, J-point elevation V2 V3  which most likely is a normal variant for her. Her echocardiogram was performed and was normal. No evidence of left ventricular hypertrophy. EF normal. No pericardial effusion. I personally reviewed echocardiogram. 2. Hypertension-excellent control currently. No changes made. Try to avoid excessive blood pressure monitoring which could lead  to increased anxiety. 3. We will see her back on as-needed basis. No further cardiac testing warranted.  Signed, Donato Schultz, MD [facility]  03/28/2014 4:10 PM

## 2014-03-28 NOTE — Patient Instructions (Signed)
The current medical regimen is effective;  continue present plan and medications.  Follow up as needed 

## 2014-05-19 ENCOUNTER — Encounter: Payer: Self-pay | Admitting: Cardiology

## 2014-08-02 ENCOUNTER — Other Ambulatory Visit: Payer: Self-pay | Admitting: Internal Medicine

## 2014-10-11 ENCOUNTER — Other Ambulatory Visit: Payer: Self-pay | Admitting: Internal Medicine

## 2020-04-22 ENCOUNTER — Emergency Department (HOSPITAL_BASED_OUTPATIENT_CLINIC_OR_DEPARTMENT_OTHER): Payer: Self-pay

## 2020-04-22 ENCOUNTER — Other Ambulatory Visit: Payer: Self-pay

## 2020-04-22 ENCOUNTER — Encounter (HOSPITAL_BASED_OUTPATIENT_CLINIC_OR_DEPARTMENT_OTHER): Payer: Self-pay | Admitting: *Deleted

## 2020-04-22 ENCOUNTER — Emergency Department (HOSPITAL_BASED_OUTPATIENT_CLINIC_OR_DEPARTMENT_OTHER)
Admission: EM | Admit: 2020-04-22 | Discharge: 2020-04-22 | Disposition: A | Discharge status: Home / Self Care | Payer: Self-pay | Attending: Emergency Medicine | Admitting: Emergency Medicine

## 2020-04-22 DIAGNOSIS — H6690 Otitis media, unspecified, unspecified ear: Secondary | ICD-10-CM

## 2020-04-22 DIAGNOSIS — I10 Essential (primary) hypertension: Secondary | ICD-10-CM | POA: Insufficient documentation

## 2020-04-22 DIAGNOSIS — H6691 Otitis media, unspecified, right ear: Secondary | ICD-10-CM | POA: Insufficient documentation

## 2020-04-22 DIAGNOSIS — R519 Headache, unspecified: Secondary | ICD-10-CM | POA: Insufficient documentation

## 2020-04-22 DIAGNOSIS — Z79899 Other long term (current) drug therapy: Secondary | ICD-10-CM | POA: Insufficient documentation

## 2020-04-22 DIAGNOSIS — I160 Hypertensive urgency: Secondary | ICD-10-CM | POA: Insufficient documentation

## 2020-04-22 IMAGING — CT CT HEAD W/O CM
3 series · 16 of 47 positions shown, 19 images · non-contrast
Comparison: None.

CLINICAL DATA: Headache.  Hypertension.

EXAM:
CT HEAD WITHOUT CONTRAST
TECHNIQUE: Contiguous axial images were obtained from the base of the skull
through the vertex without intravenous contrast.

[Series 2: head wo · axial · 0.41mm/px · z∈[-150,-25]mm · 10 of 31 slices shown, 13 images]
[im 3/31  brain]
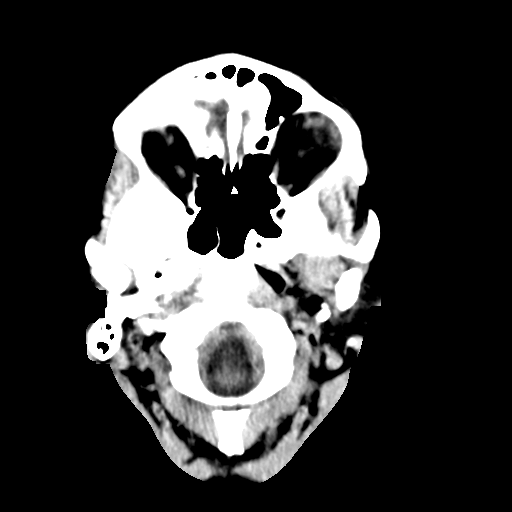
[im 3/31  bone]
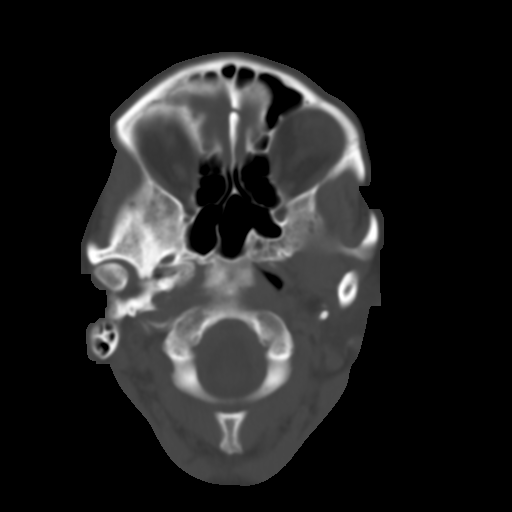
[im 6/31  brain]
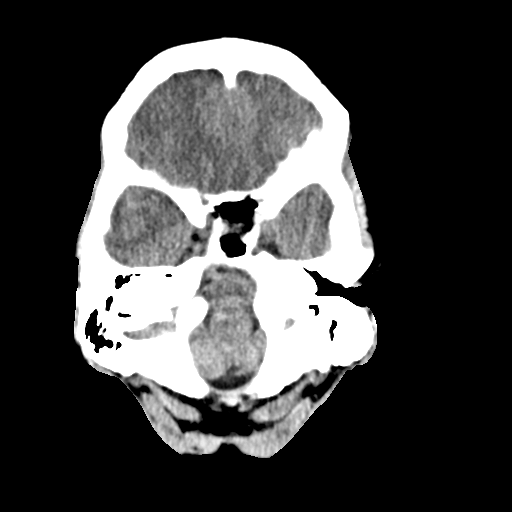
[im 9/31  brain]
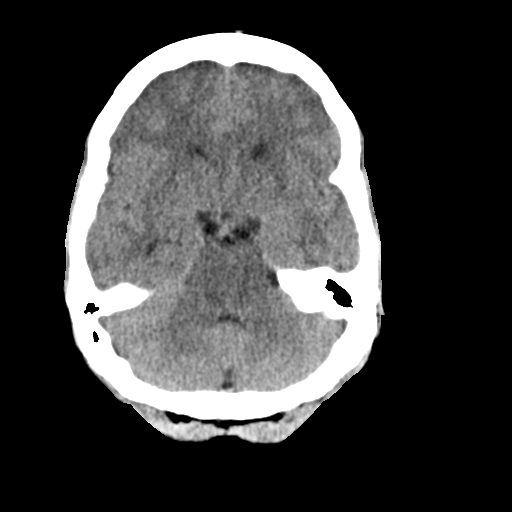
[im 11/31  brain]
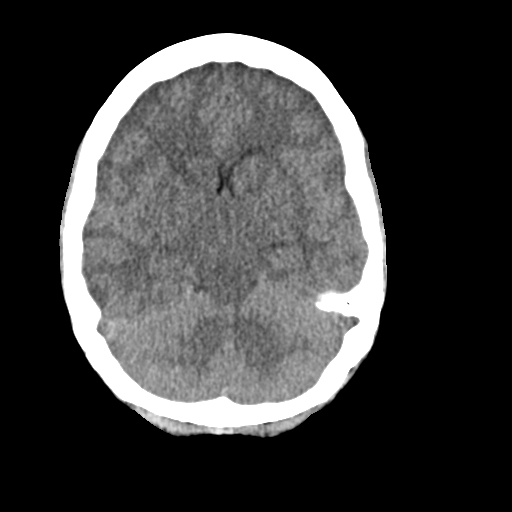
[im 14/31  brain]
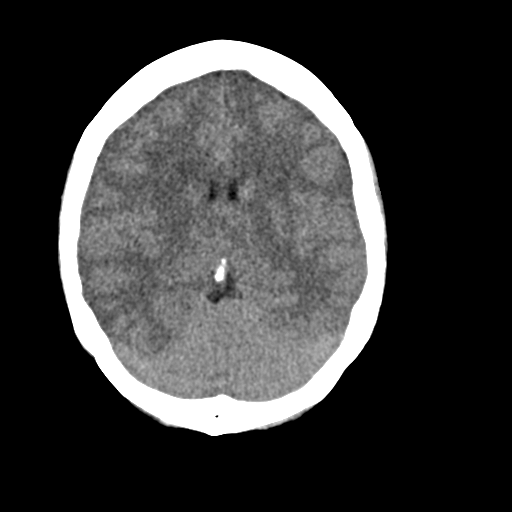
[im 14/31  bone]
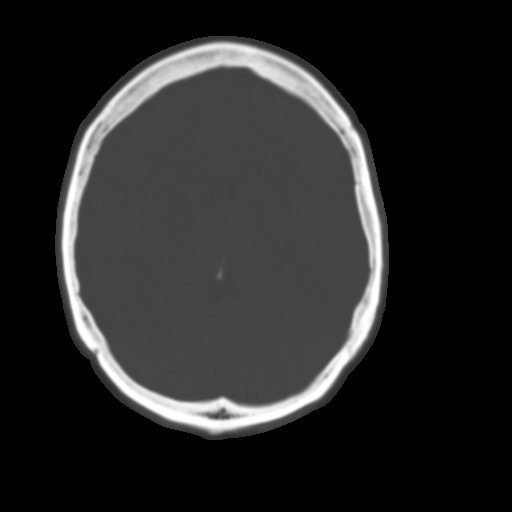
[im 17/31  brain]
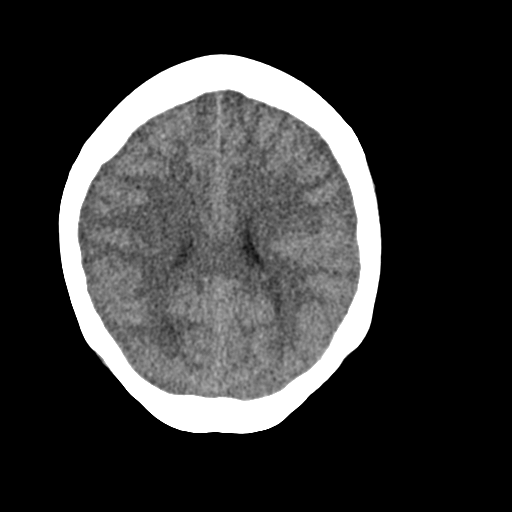
[im 20/31  brain]
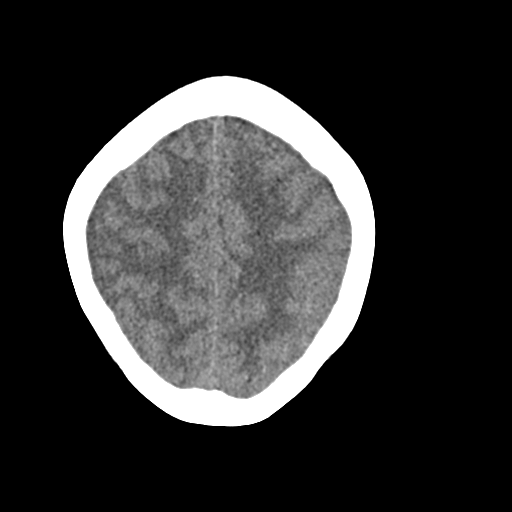
[im 23/31  brain]
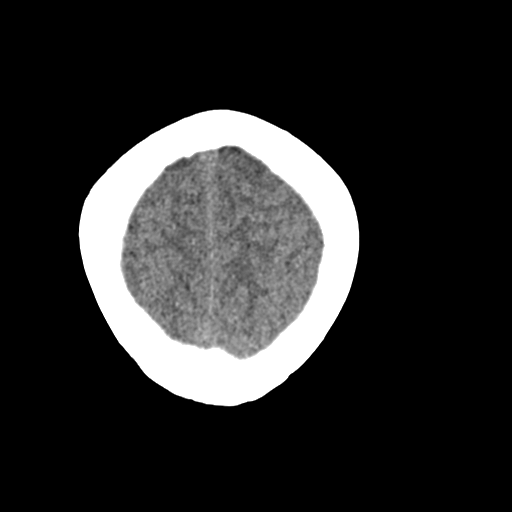
[im 25/31  brain]
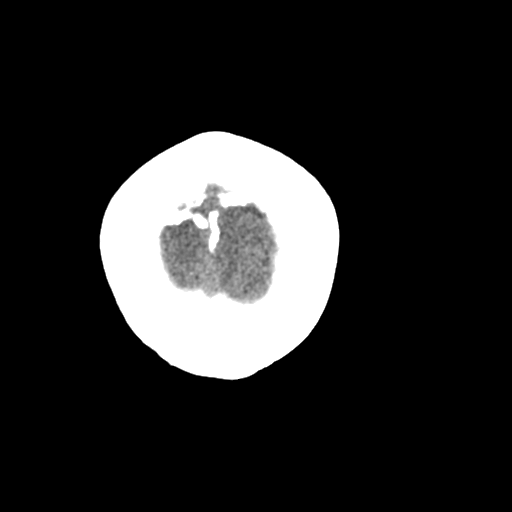
[im 25/31  bone]
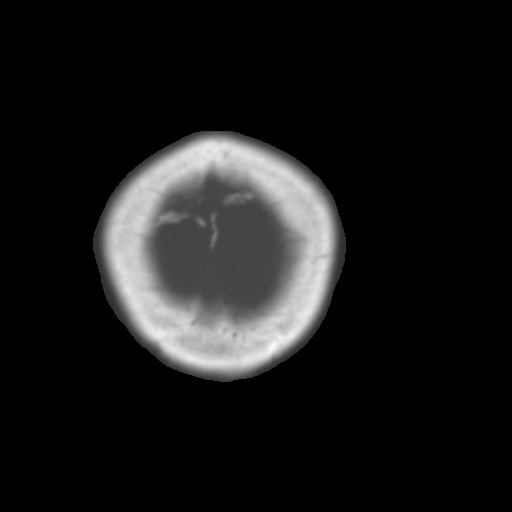
[im 28/31  brain]
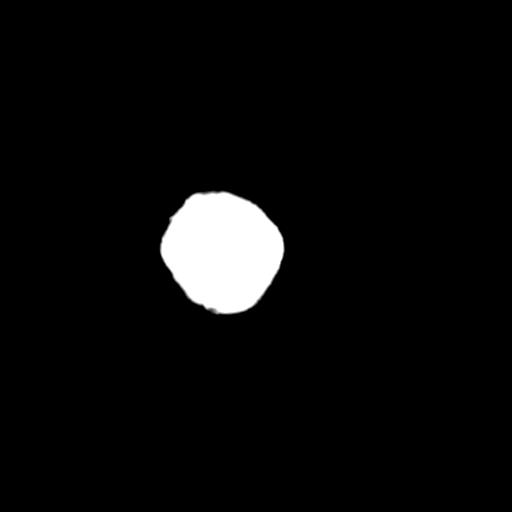

[Series 4: coronal soft · coronal · 0.31mm/px · 3 of 61 slices shown]
[im 21/61  brain]
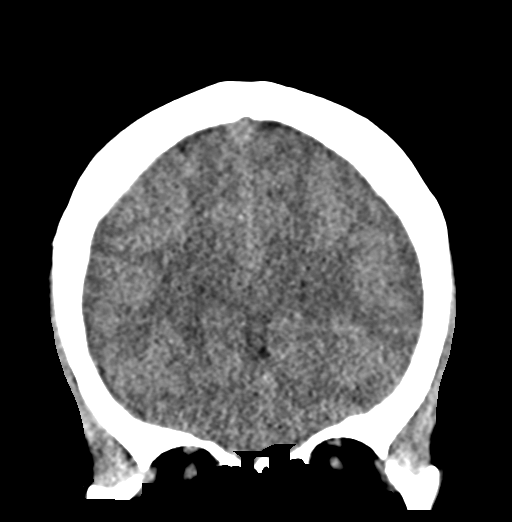
[im 27/61  brain]
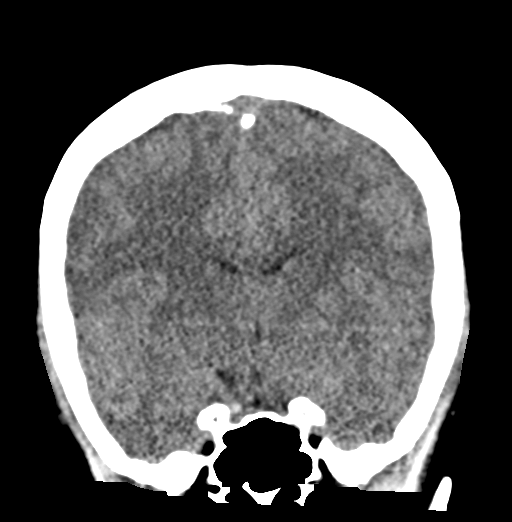
[im 34/61  brain]
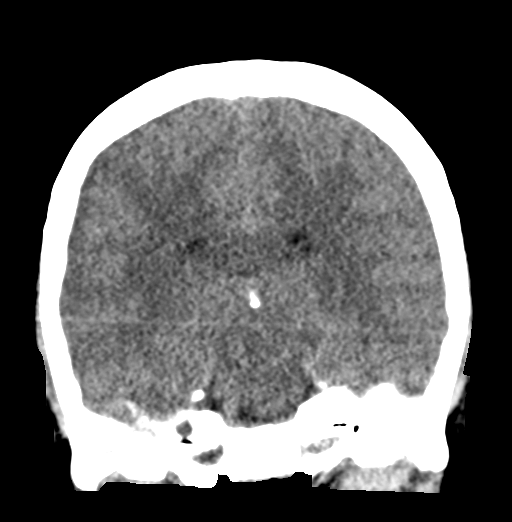

[Series 5: sag soft · sagittal · 0.31mm/px · 3 of 51 slices shown]
[im 17/51  brain]
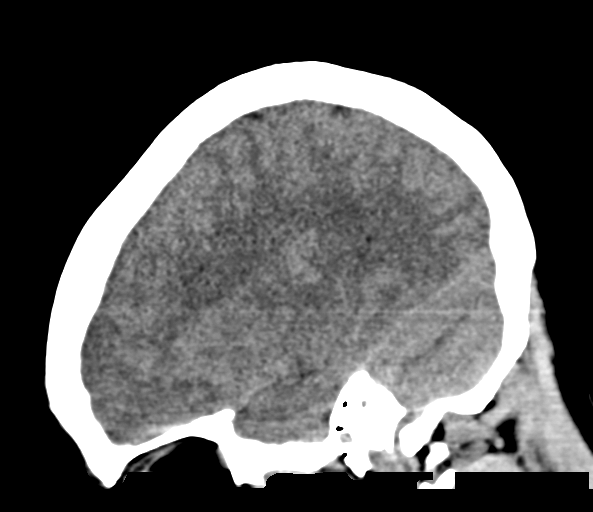
[im 26/51  brain]
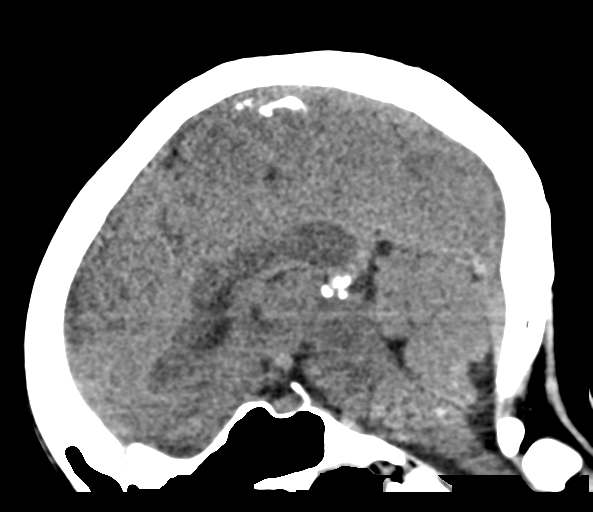
[im 34/51  brain]
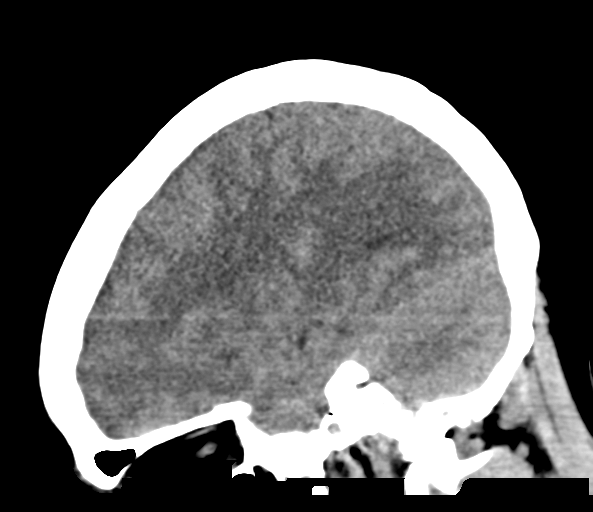

[16 of 47 positions shown; findings below may reference images not displayed]

FINDINGS: Brain: The brain shows a normal appearance without evidence of
malformation, atrophy, old or acute small or large vessel
infarction, mass lesion, hemorrhage, hydrocephalus or extra-axial
collection.

Vascular: No hyperdense vessel. No evidence of atherosclerotic
calcification.

Skull: Normal.  No traumatic finding.  No focal bone lesion.

Sinuses/Orbits: Sinuses are clear. Orbits appear normal. Mastoids
are clear.

Other: None significant
IMPRESSION: Normal head CT.

## 2020-04-22 MED ORDER — AMOXICILLIN 500 MG PO CAPS: 500.0000 mg | ORAL_CAPSULE | Freq: Three times a day (TID) | ORAL | 0 refills | Status: DC

## 2020-04-22 NOTE — ED Notes (Signed)
Patient transported to CT 

## 2020-04-22 NOTE — ED Triage Notes (Addendum)
C/o h/a x 3 weeks denies, increased BP in triage, Head injury

## 2020-04-22 NOTE — ED Provider Notes (Signed)
[facility] EMERGENCY DEPARTMENT Provider Note   CSN: [id number] Arrival date & time: 04/22/20  1430     History Chief Complaint  Patient presents with  . Headache    Charlene [name] is a 54 y.o. female.  Patient is a 54 year old female with history of hypertension currently untreated secondary to patient religious beliefs.  She presents today for evaluation of headache and right ear pain.  This has been worsening over the past several weeks.  She denies any visual disturbances, fevers, or chills.  She denies any stiff neck.  Patient does have significantly elevated blood pressure here in the ED.  She describes muffled hearing.  The history is provided by the patient.  Headache Pain location:  R parietal Quality:  Stabbing Radiates to:  Does not radiate Duration:  3 weeks Timing:  Constant Progression:  Worsening Chronicity:  New Relieved by:  Nothing Worsened by:  Nothing      Past Medical History:  Diagnosis Date  . Hypertension     Patient Active Problem List   Diagnosis Date Noted  . Anxiety state, unspecified 03/11/2014  . Essential hypertension 03/11/2014  . Essential hypertension, benign 02/28/2014  . Accelerated hypertension 02/21/2014  . Nausea & vomiting 02/21/2014  . Headache 02/21/2014  . Chest pain 02/21/2014    Past Surgical History:  Procedure Laterality Date  . CESAREAN SECTION       OB History    Gravida  9   Para  6   Term  6   Preterm      AB  2   Living  6     SAB  2   TAB      Ectopic      Multiple      Live Births  6           Family History  Problem Relation Age of Onset  . Hypertension Father   . Cancer Maternal Grandmother   . Thyroid disease Paternal Uncle     Social History   Tobacco Use  . Smoking status: Never Smoker  Substance Use Topics  . Alcohol use: No  . Drug use: No    Home Medications Prior to Admission medications   Medication Sig Start Date End Date Taking?  Authorizing Provider  hydrochlorothiazide (MICROZIDE) 12.5 MG capsule Take 12.5 mg by mouth daily. 03/10/14   Doris Cheadle, MD  metoprolol tartrate (LOPRESSOR) 25 MG tablet Take 50 mg by mouth daily. 03/10/14   Doris Cheadle, MD    Allergies    Patient has no known allergies.  Review of Systems   Review of Systems  Neurological: Positive for headaches.  All other systems reviewed and are negative.   Physical Exam Updated Vital Signs BP (!) 228/137 (BP Location: Right Arm)   Pulse (!) 102   Temp 99 F (37.2 C) (Oral)   Resp 12   Ht 5\' 5"  (1.651 m)   Wt 61.2 kg   LMP 03/03/2014   SpO2 100%   BMI 22.47 kg/m   Physical Exam Vitals and nursing note reviewed.  Constitutional:      General: She is not in acute distress.    Appearance: She is well-developed. She is not diaphoretic.  HENT:     Head: Normocephalic and atraumatic.  Eyes:     Comments: There is fluid behind the right TM, however no significant erythema.  Cardiovascular:     Rate and Rhythm: Normal rate and regular rhythm.  Heart sounds: No murmur heard.  No friction rub. No gallop.   Pulmonary:     Effort: Pulmonary effort is normal. No respiratory distress.     Breath sounds: Normal breath sounds. No wheezing.  Abdominal:     General: Bowel sounds are normal. There is no distension.     Palpations: Abdomen is soft.     Tenderness: There is no abdominal tenderness.  Musculoskeletal:        General: Normal range of motion.     Cervical back: Normal range of motion and neck supple.  Skin:    General: Skin is warm and dry.  Neurological:     Mental Status: She is alert and oriented to person, place, and time.     ED Results / Procedures / Treatments   Labs (all labs ordered are listed, but only abnormal results are displayed) Labs Reviewed - No data to display  EKG None  Radiology CT Head Wo Contrast  Result Date: 04/22/2020 CLINICAL DATA:  Headache.  Hypertension. EXAM: CT HEAD WITHOUT  CONTRAST TECHNIQUE: Contiguous axial images were obtained from the base of the skull through the vertex without intravenous contrast. COMPARISON:  None. FINDINGS: Brain: The brain shows a normal appearance without evidence of malformation, atrophy, old or acute small or large vessel infarction, mass lesion, hemorrhage, hydrocephalus or extra-axial collection. Vascular: No hyperdense vessel. No evidence of atherosclerotic calcification. Skull: Normal.  No traumatic finding.  No focal bone lesion. Sinuses/Orbits: Sinuses are clear. Orbits appear normal. Mastoids are clear. Other: None significant IMPRESSION: Normal head CT. Electronically Signed   By: Paulina Fusi M.D.   On: 04/22/2020 15:45    Procedures Procedures (including critical care time)  Medications Ordered in ED Medications - No data to display  ED Course  I have reviewed the triage vital signs and the nursing notes.  Pertinent labs & imaging results that were available during my care of the patient were reviewed by me and considered in my medical decision making (see chart for details).    MDM Rules/Calculators/A&P  Patient presenting here with complaints of headache and ear pain.  She is neurologically intact and head CT is negative.  She does have some fluid behind her right TM and I suspect otitis.  She will be treated with amoxicillin for this.  Patient also found to have a blood pressure is elevated to the 230s systolic while here in the ED.  I had a discussion with the patient regarding this.  She tells me her blood pressure has "always been like that".  She has been recommended to take medication in the past, but tells me that "God will take care of her".  She is declining intervention on this blood pressure at this time.  I advised her of the consequences of untreated hypertension including the possibility of stroke, dissections, renal failure, and heart disease.  She understands these risks and continues to decline blood  pressure medication.  Patient to be discharged.  Final Clinical Impression(s) / ED Diagnoses Final diagnoses:  None    Rx / DC Orders ED Discharge Orders    None       Geoffery Lyons, MD 04/22/20 [id number]

## 2020-04-22 NOTE — Discharge Instructions (Addendum)
Begin taking amoxicillin as prescribed.  I highly recommend you follow-up with a primary care provider regarding your blood pressures.  There are many complications that can result due to uncontrolled hypertension including stroke, heart disease, dissections, renal failure, and many other life-threatening conditions.

## 2020-05-05 ENCOUNTER — Emergency Department (HOSPITAL_BASED_OUTPATIENT_CLINIC_OR_DEPARTMENT_OTHER): Payer: Self-pay

## 2020-05-05 ENCOUNTER — Encounter (HOSPITAL_BASED_OUTPATIENT_CLINIC_OR_DEPARTMENT_OTHER): Payer: Self-pay | Admitting: *Deleted

## 2020-05-05 ENCOUNTER — Emergency Department (HOSPITAL_BASED_OUTPATIENT_CLINIC_OR_DEPARTMENT_OTHER)
Admission: EM | Admit: 2020-05-05 | Discharge: 2020-05-05 | Disposition: A | Discharge status: Home / Self Care | Payer: Self-pay | Attending: Emergency Medicine | Admitting: Emergency Medicine

## 2020-05-05 ENCOUNTER — Other Ambulatory Visit: Payer: Self-pay

## 2020-05-05 DIAGNOSIS — R519 Headache, unspecified: Secondary | ICD-10-CM | POA: Insufficient documentation

## 2020-05-05 DIAGNOSIS — Z79899 Other long term (current) drug therapy: Secondary | ICD-10-CM | POA: Insufficient documentation

## 2020-05-05 DIAGNOSIS — I160 Hypertensive urgency: Secondary | ICD-10-CM | POA: Insufficient documentation

## 2020-05-05 LAB — COMPREHENSIVE METABOLIC PANEL
ALT: 12 U/L (ref 0–44)
AST: 19 U/L (ref 15–41)
Albumin: 4.6 g/dL (ref 3.5–5.0)
Alkaline Phosphatase: 65 U/L (ref 38–126)
Anion gap: 11 (ref 5–15)
BUN: 13 mg/dL (ref 6–20)
CO2: 29 mmol/L (ref 22–32)
Calcium: 9.7 mg/dL (ref 8.9–10.3)
Chloride: 99 mmol/L (ref 98–111)
Creatinine, Ser: 1.07 mg/dL — ABNORMAL HIGH (ref 0.44–1.00)
GFR, Estimated: 59 mL/min — ABNORMAL LOW (ref >60)
Glucose, Bld: 102 mg/dL — ABNORMAL HIGH (ref 70–99)
Potassium: 3.6 mmol/L (ref 3.5–5.1)
Sodium: 139 mmol/L (ref 135–145)
Total Bilirubin: 0.8 mg/dL (ref 0.3–1.2)
Total Protein: 8.3 g/dL — ABNORMAL HIGH (ref 6.5–8.1)

## 2020-05-05 LAB — URINALYSIS, ROUTINE W REFLEX MICROSCOPIC
Bilirubin Urine: NEGATIVE
Glucose, UA: NEGATIVE mg/dL
Ketones, ur: NEGATIVE mg/dL
Nitrite: NEGATIVE
Protein, ur: NEGATIVE mg/dL
Specific Gravity, Urine: 1.01 (ref 1.005–1.030)
pH: 8 (ref 5.0–8.0)

## 2020-05-05 LAB — CBC WITH DIFFERENTIAL/PLATELET
Abs Immature Granulocytes: 0.01 10*3/uL (ref 0.00–0.07)
Basophils Absolute: 0 10*3/uL (ref 0.0–0.1)
Basophils Relative: 1 %
Eosinophils Absolute: 0 10*3/uL (ref 0.0–0.5)
Eosinophils Relative: 0 %
HCT: 39 % (ref 36.0–46.0)
Hemoglobin: 12.3 g/dL (ref 12.0–15.0)
Immature Granulocytes: 0 %
Lymphocytes Relative: 23 %
Lymphs Abs: 1.4 10*3/uL (ref 0.7–4.0)
MCH: 24.1 pg — ABNORMAL LOW (ref 26.0–34.0)
MCHC: 31.5 g/dL (ref 30.0–36.0)
MCV: 76.3 fL — ABNORMAL LOW (ref 80.0–100.0)
Monocytes Absolute: 0.2 10*3/uL (ref 0.1–1.0)
Monocytes Relative: 4 %
Neutro Abs: 4.5 10*3/uL (ref 1.7–7.7)
Neutrophils Relative %: 72 %
Platelets: 288 10*3/uL (ref 150–400)
RBC: 5.11 MIL/uL (ref 3.87–5.11)
RDW: 14.6 % (ref 11.5–15.5)
WBC: 6.2 10*3/uL (ref 4.0–10.5)
nRBC: 0 % (ref 0.0–0.2)

## 2020-05-05 LAB — URINALYSIS, MICROSCOPIC (REFLEX)

## 2020-05-05 IMAGING — CT CT ANGIO HEAD
1 of 13 series · 3 of 33 positions shown · IV contrast (Omnipaque)
Comparison: Noncontrast head CT [DATE]

CLINICAL DATA: Vertebral artery aneurysm suspected. Additional
history provided: Head pain, recent diagnosis of ear infection.

EXAM:
CT ANGIOGRAPHY HEAD AND NECK
TECHNIQUE: Multidetector CT imaging of the head and neck was performed using
the standard protocol during bolus administration of intravenous
contrast. Multiplanar CT image reconstructions and MIPs were
obtained to evaluate the vascular anatomy. Carotid stenosis
measurements (when applicable) are obtained utilizing NASCET
criteria, using the distal internal carotid diameter as the
denominator.
CONTRAST:  100mL OMNIPAQUE IOHEXOL 350 MG/ML SOLN

[Series 12: axial thin · axial · 0.39mm/px · z∈[+621,+939]mm · 3 of 319 slices shown]
[im 1/319  soft-tissue]
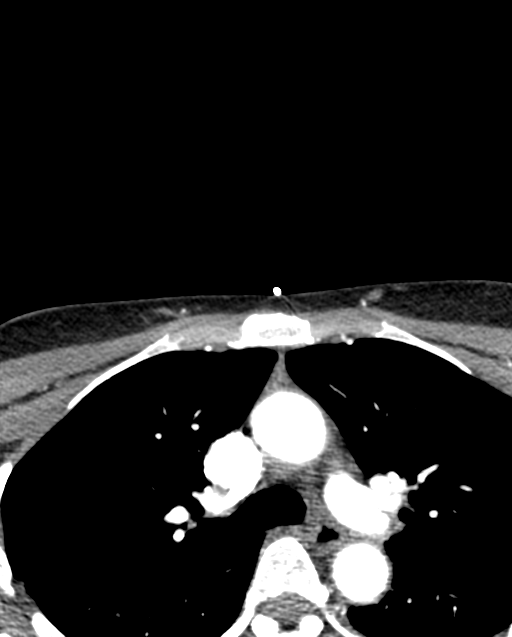
[im 160/319  bone]
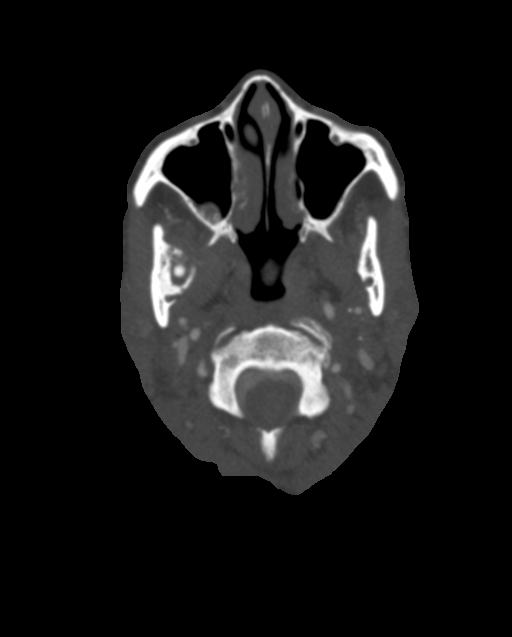
[im 319/319  soft-tissue]
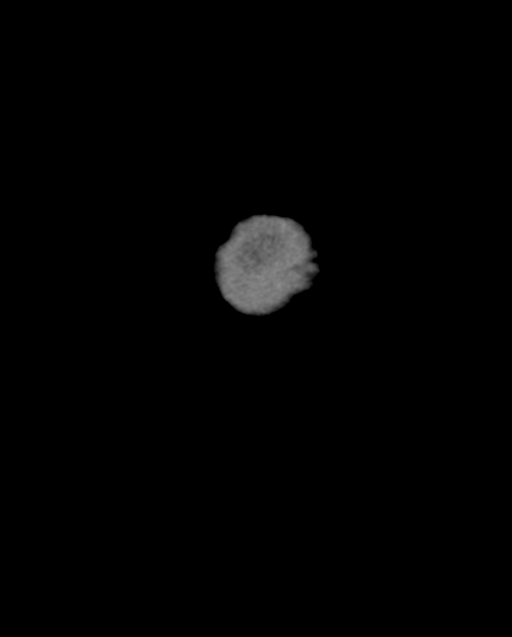

[3 of 33 positions shown; findings below may reference images not displayed]

FINDINGS: CT HEAD FINDINGS

Brain:

Cerebral volume is normal.

There is no acute intracranial hemorrhage.

No demarcated cortical infarct.

No extra-axial fluid collection.

No evidence of intracranial mass.

No midline shift.

Vascular: Reported below.

Skull: Normal. Negative for fracture or focal lesion.

Sinuses: No significant paranasal sinus disease. Left mastoid
effusion.

Orbits: No acute finding.

Review of the MIP images confirms the above findings

CTA NECK FINDINGS

Aortic arch: Common origin of the innominate and left common carotid
arteries. Streak artifact from a dense right-sided contrast bolus
partially obscures the right subclavian artery. No appreciate
hemodynamically significant innominate or proximal subclavian artery
stenosis.

Right carotid system: CCA and ICA patent within the neck without
stenosis. No significant atherosclerotic disease.

Left carotid system: CCA and ICA patent within the neck without
stenosis. No significant atherosclerotic disease.

Vertebral arteries: Patent within the neck bilaterally. Streak
artifact from a dense right-sided contrast bolus limits evaluation
of the origin of the right vertebral artery. Soft plaque results in
apparent moderate/severe stenosis at the origin of the of the
bilateral vertebral arteries.

Skeleton: No acute bony abnormality or aggressive osseous lesion.
Nonspecific reversal of the expected cervical lordosis. Cervical
spondylosis with multilevel disc space narrowing, disc bulges and
uncovertebral hypertrophy. No high-grade bony spinal canal stenosis.
Multilevel bony neural foraminal narrowing. There are lytic changes
within the maxilla bilaterally and right greater than left mandible
with a somewhat expansile appearance at some sites.

Other neck: No neck mass or cervical lymphadenopathy. Subcentimeter
thyroid nodules not meeting consensus criteria for ultrasound
follow-up.

Upper chest: There is biapical pleuroparenchymal scarring. There is
a 6 mm nodular focus abutting the pleura within the right upper lobe
which may reflect a true pulmonary nodule (series 12, image 33).

Review of the MIP images confirms the above findings

CTA HEAD FINDINGS

Anterior circulation:

The intracranial internal carotid arteries are patent.

The M1 middle cerebral arteries are patent without significant
stenosis. No M2 proximal branch occlusion or high-grade proximal
stenosis is identified.

The anterior cerebral arteries are patent.

No intracranial aneurysm is identified.

Posterior circulation:

The intracranial vertebral arteries are patent. The basilar artery
is patent. The posterior cerebral arteries are patent. Posterior
communicating arteries are hypoplastic or absent bilaterally.

Venous sinuses: Within limitations of contrast timing, no convincing
thrombus.

Anatomic variants: As described

Review of the MIP images confirms the above findings
IMPRESSION: CT head:

1. No evidence of acute intracranial abnormality.
2. Left mastoid effusion.

CTA neck:

1. The bilateral common and internal carotid arteries are patent
within the neck without stenosis.
2. The vertebral arteries are patent within the neck bilaterally.
Soft plaque results moderate/severe stenosis at the origin of both
vertebral arteries. No evidence of cervical vertebral artery
dissection or aneurysm.
3. Biapical pleuroparenchymal scarring. A 6 mm nodular focus
abutting the pleura within the right upper lobe may reflect a true
pulmonary nodule. Non-contrast chest CT at 6-12 months is
recommended. If the nodule is stable at time of repeat CT, then
future CT at 18-24 months (from today's scan) is considered optional
for low-risk patients, but is recommended for high-risk patients.
This recommendation follows the consensus statement: Guidelines for
Management of Incidental Pulmonary Nodules Detected on CT Images:
4. There are lytic changes within the maxilla bilaterally and right
greater than left mandible with a somewhat expansile appearance at
some sites. This is favored related to dental disease. Outpatient
OMFS consultation is recommended.
5. Cervical spondylosis.

CTA head:

1. No intracranial large vessel occlusion or proximal high-grade
arterial stenosis.
2. No intracranial aneurysm is identified.

## 2020-05-05 IMAGING — CT CT ANGIO NECK
1 of 11 series · 5 of 33 positions shown · IV contrast (omnipaque)
Comparison: Noncontrast head CT [DATE]

CLINICAL DATA: Vertebral artery aneurysm suspected. Additional
history provided: Head pain, recent diagnosis of ear infection.

EXAM:
CT ANGIOGRAPHY HEAD AND NECK
TECHNIQUE: Multidetector CT imaging of the head and neck was performed using
the standard protocol during bolus administration of intravenous
contrast. Multiplanar CT image reconstructions and MIPs were
obtained to evaluate the vascular anatomy. Carotid stenosis
measurements (when applicable) are obtained utilizing NASCET
criteria, using the distal internal carotid diameter as the
denominator.
CONTRAST:  100mL OMNIPAQUE IOHEXOL 350 MG/ML SOLN

[Series 12: axial thin · axial · 0.39mm/px · z∈[+674,+886]mm · 5 of 319 slices shown]
[im 54/319  soft-tissue]
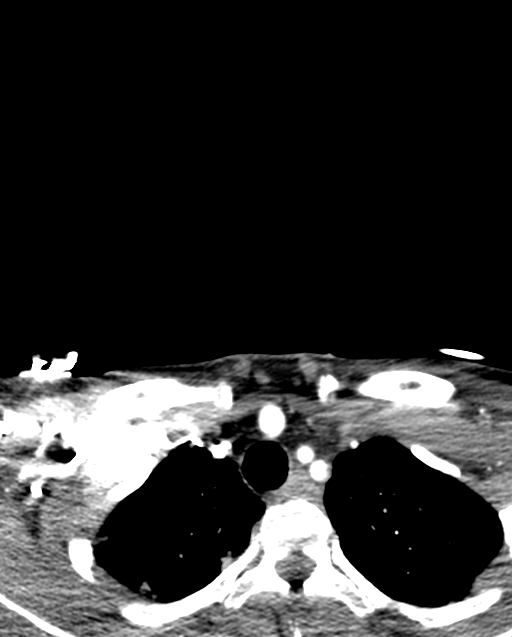
[im 107/319  bone]
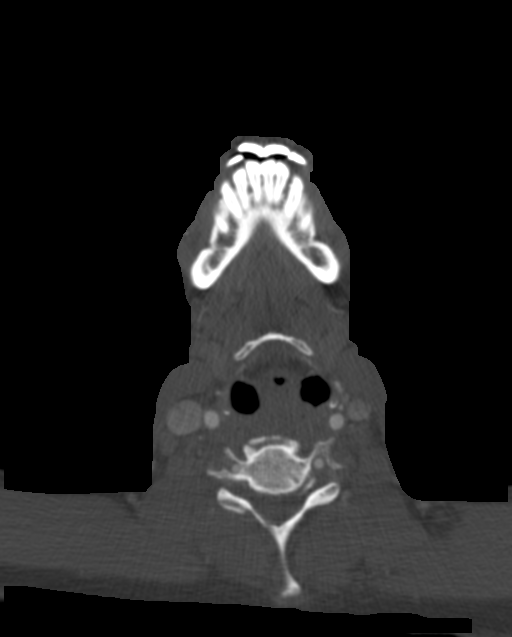
[im 160/319  soft-tissue]
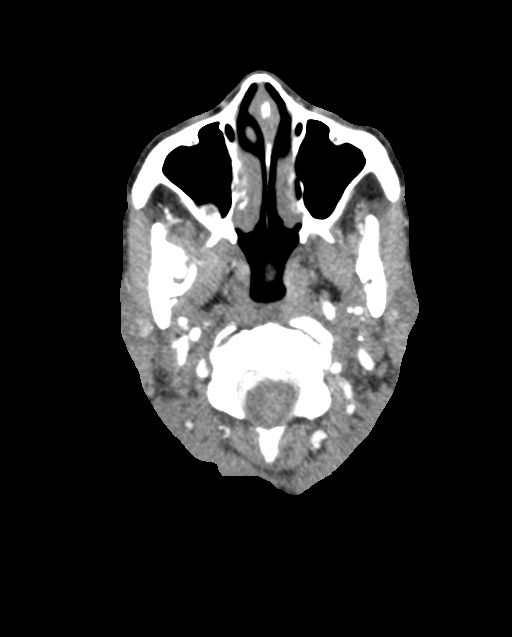
[im 213/319  bone]
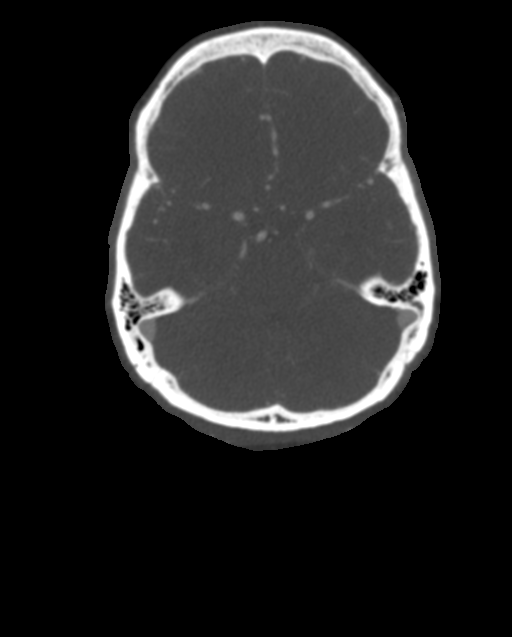
[im 266/319  soft-tissue]
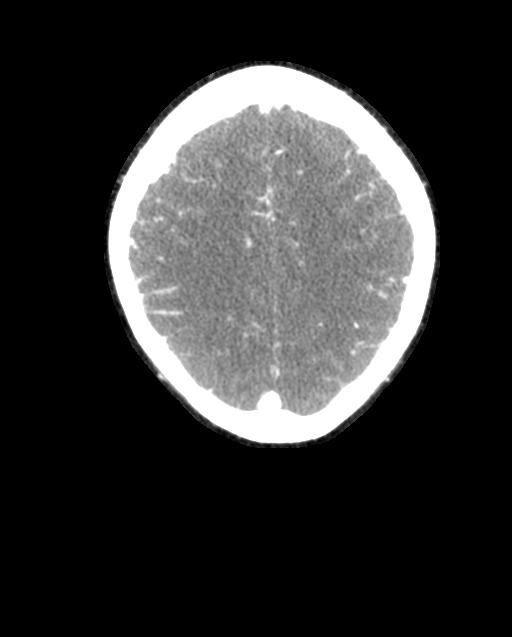

[5 of 33 positions shown; findings below may reference images not displayed]

FINDINGS: CT HEAD FINDINGS

Brain:

Cerebral volume is normal.

There is no acute intracranial hemorrhage.

No demarcated cortical infarct.

No extra-axial fluid collection.

No evidence of intracranial mass.

No midline shift.

Vascular: Reported below.

Skull: Normal. Negative for fracture or focal lesion.

Sinuses: No significant paranasal sinus disease. Left mastoid
effusion.

Orbits: No acute finding.

Review of the MIP images confirms the above findings

CTA NECK FINDINGS

Aortic arch: Common origin of the innominate and left common carotid
arteries. Streak artifact from a dense right-sided contrast bolus
partially obscures the right subclavian artery. No appreciate
hemodynamically significant innominate or proximal subclavian artery
stenosis.

Right carotid system: CCA and ICA patent within the neck without
stenosis. No significant atherosclerotic disease.

Left carotid system: CCA and ICA patent within the neck without
stenosis. No significant atherosclerotic disease.

Vertebral arteries: Patent within the neck bilaterally. Streak
artifact from a dense right-sided contrast bolus limits evaluation
of the origin of the right vertebral artery. Soft plaque results in
apparent moderate/severe stenosis at the origin of the of the
bilateral vertebral arteries.

Skeleton: No acute bony abnormality or aggressive osseous lesion.
Nonspecific reversal of the expected cervical lordosis. Cervical
spondylosis with multilevel disc space narrowing, disc bulges and
uncovertebral hypertrophy. No high-grade bony spinal canal stenosis.
Multilevel bony neural foraminal narrowing. There are lytic changes
within the maxilla bilaterally and right greater than left mandible
with a somewhat expansile appearance at some sites.

Other neck: No neck mass or cervical lymphadenopathy. Subcentimeter
thyroid nodules not meeting consensus criteria for ultrasound
follow-up.

Upper chest: There is biapical pleuroparenchymal scarring. There is
a 6 mm nodular focus abutting the pleura within the right upper lobe
which may reflect a true pulmonary nodule (series 12, image 33).

Review of the MIP images confirms the above findings

CTA HEAD FINDINGS

Anterior circulation:

The intracranial internal carotid arteries are patent.

The M1 middle cerebral arteries are patent without significant
stenosis. No M2 proximal branch occlusion or high-grade proximal
stenosis is identified.

The anterior cerebral arteries are patent.

No intracranial aneurysm is identified.

Posterior circulation:

The intracranial vertebral arteries are patent. The basilar artery
is patent. The posterior cerebral arteries are patent. Posterior
communicating arteries are hypoplastic or absent bilaterally.

Venous sinuses: Within limitations of contrast timing, no convincing
thrombus.

Anatomic variants: As described

Review of the MIP images confirms the above findings
IMPRESSION: CT head:

1. No evidence of acute intracranial abnormality.
2. Left mastoid effusion.

CTA neck:

1. The bilateral common and internal carotid arteries are patent
within the neck without stenosis.
2. The vertebral arteries are patent within the neck bilaterally.
Soft plaque results moderate/severe stenosis at the origin of both
vertebral arteries. No evidence of cervical vertebral artery
dissection or aneurysm.
3. Biapical pleuroparenchymal scarring. A 6 mm nodular focus
abutting the pleura within the right upper lobe may reflect a true
pulmonary nodule. Non-contrast chest CT at 6-12 months is
recommended. If the nodule is stable at time of repeat CT, then
future CT at 18-24 months (from today's scan) is considered optional
for low-risk patients, but is recommended for high-risk patients.
This recommendation follows the consensus statement: Guidelines for
Management of Incidental Pulmonary Nodules Detected on CT Images:
4. There are lytic changes within the maxilla bilaterally and right
greater than left mandible with a somewhat expansile appearance at
some sites. This is favored related to dental disease. Outpatient
OMFS consultation is recommended.
5. Cervical spondylosis.

CTA head:

1. No intracranial large vessel occlusion or proximal high-grade
arterial stenosis.
2. No intracranial aneurysm is identified.

## 2020-05-05 MED ORDER — ACETAMINOPHEN 500 MG PO TABS
1000.0000 mg | ORAL_TABLET | Freq: Once | ORAL | Status: AC
  Administered 2020-05-05: 1000 mg via ORAL
  Filled 2020-05-05: qty 2

## 2020-05-05 MED ORDER — CARVEDILOL 6.25 MG PO TABS
6.2500 mg | ORAL_TABLET | Freq: Two times a day (BID) | ORAL | Status: DC
  Administered 2020-05-05: 6.25 mg via ORAL
  Filled 2020-05-05: qty 1

## 2020-05-05 MED ORDER — IOHEXOL 350 MG/ML SOLN
100.0000 mL | Freq: Once | INTRAVENOUS | Status: AC | PRN
  Administered 2020-05-05: 100 mL via INTRAVENOUS

## 2020-05-05 MED ORDER — CARVEDILOL 6.25 MG PO TABS: 6.2500 mg | ORAL_TABLET | Freq: Two times a day (BID) | ORAL | 1 refills | Status: DC

## 2020-05-05 MED ORDER — SODIUM CHLORIDE 0.9 % IV SOLN: Freq: Once | INTRAVENOUS | Status: AC

## 2020-05-05 NOTE — Discharge Instructions (Signed)
1.  It is very important that you get established with a family doctor and start managing your hypertension and the complications associated with that.  You have been given a resource guide to use to find a doctor.  You also may use the referral number in your discharge instructions for help finding a provider. 2.  You have had severe untreated hypertension for many years.  This results in complications that can lead suddenly to stroke, heart attack and kidney failure. 3.  Start taking Coreg as prescribed twice daily.  Monitor your blood pressures on a home machine and write down the values 2-3 times a day.  Take this log to your family doctor for adjustments of your medication. 4.  Your headache may very well be coming from a wisdom tooth that is causing severe swelling in your gums.  See a dentist as soon as possible for evaluation of this. 5.  Return to the emergency department if you have any blurred vision or double vision, headache that is more than mild any chest pain, any weakness numbness or tingling of your extremities or other concerning symptoms.

## 2020-05-05 NOTE — ED Provider Notes (Signed)
[facility] EMERGENCY DEPARTMENT Provider Note   CSN: [id number] Arrival date & time: 05/05/20  1448     History Chief Complaint  Patient presents with  . Hypertension  . Headache    Charlene [name] is a 54 y.o. female.  HPI Patient reports she had a headache that has been on and off for 2 weeks.  She reports it is severe at times.  She perceives a sharp pain that is behind her ear and into her temple and behind her forehead.  It comes and goes.  At times the headache resolves but then returns again.  She has not had any vomiting but has occasionally felt nauseated.  She denies any nasal congestion drainage or sore throat.  Patient reports she feels like sometimes her hearing is impaired or she is getting a pounding sound.  No fevers no neck stiffness.  No visual changes.  No focal weakness numbness or tingling of extremities.  Patient know she has hypertension and was hospitalized in 2015 for accelerated hypertension.  She has not taken any medication since that time.  She was seen in the emergency department 10\6.  At that time a noncontrast CT head obtained.  Effusion was noted behind the right ear.  Patient was treated with antibiotics.  She reports she has taken these but has had no improvement.    Past Medical History:  Diagnosis Date  . Hypertension     Patient Active Problem List   Diagnosis Date Noted  . Anxiety state, unspecified 03/11/2014  . Essential hypertension 03/11/2014  . Essential hypertension, benign 02/28/2014  . Accelerated hypertension 02/21/2014  . Nausea & vomiting 02/21/2014  . Headache 02/21/2014  . Chest pain 02/21/2014    Past Surgical History:  Procedure Laterality Date  . CESAREAN SECTION       OB History    Gravida  9   Para  6   Term  6   Preterm      AB  2   Living  6     SAB  2   TAB      Ectopic      Multiple      Live Births  6           Family History  Problem Relation Age of Onset  .  Hypertension Father   . Cancer Maternal Grandmother   . Thyroid disease Paternal Uncle     Social History   Tobacco Use  . Smoking status: Never Smoker  . Smokeless tobacco: Never Used  Substance Use Topics  . Alcohol use: No  . Drug use: No    Home Medications Prior to Admission medications   Medication Sig Start Date End Date Taking? Authorizing Provider  amoxicillin (AMOXIL) 500 MG capsule Take 1 capsule (500 mg total) by mouth 3 (three) times daily. 04/22/20   Geoffery Lyons, MD  carvedilol (COREG) 6.25 MG tablet Take 1 tablet (6.25 mg total) by mouth 2 (two) times daily with a meal. 05/05/20   Arby Barrette, MD  hydrochlorothiazide (MICROZIDE) 12.5 MG capsule Take 12.5 mg by mouth daily. 03/10/14   Doris Cheadle, MD  metoprolol tartrate (LOPRESSOR) 25 MG tablet Take 50 mg by mouth daily. 03/10/14   Doris Cheadle, MD    Allergies    Patient has no known allergies.  Review of Systems   Review of Systems 10 systems reviewed and negative except as per HPI Physical Exam Updated Vital Signs BP (!) 200/122 (BP Location: Right  Arm)   Pulse 81   Temp 98.5 F (36.9 C) (Oral)   Resp 13   LMP 03/03/2014   SpO2 99%   Physical Exam Constitutional:      Appearance: She is well-developed.  HENT:     Head: Normocephalic and atraumatic.     Right Ear: Tympanic membrane normal.     Left Ear: Tympanic membrane normal.     Mouth/Throat:     Mouth: Mucous membranes are moist.     Pharynx: Oropharynx is clear.     Comments: Patient has a very hypertrophic gum and bony tissue around the upper wisdom tooth on the right.  There is a tunnel of soft tissue back to a slightly visible bony or tooth structure.  No external facial swelling. Eyes:     Pupils: Pupils are equal, round, and reactive to light.  Cardiovascular:     Rate and Rhythm: Normal rate and regular rhythm.     Heart sounds: Normal heart sounds.  Pulmonary:     Effort: Pulmonary effort is normal.     Breath sounds:  Normal breath sounds.  Abdominal:     General: Bowel sounds are normal. There is no distension.     Palpations: Abdomen is soft.     Tenderness: There is no abdominal tenderness.  Musculoskeletal:        General: Normal range of motion.     Cervical back: Neck supple.  Skin:    General: Skin is warm and dry.  Neurological:     General: No focal deficit present.     Mental Status: She is alert and oriented to person, place, and time.     GCS: GCS eye subscore is 4. GCS verbal subscore is 5. GCS motor subscore is 6.     Cranial Nerves: No cranial nerve deficit.     Sensory: No sensory deficit.     Coordination: Coordination normal.  Psychiatric:        Mood and Affect: Mood normal.     ED Results / Procedures / Treatments   Labs (all labs ordered are listed, but only abnormal results are displayed) Labs Reviewed  COMPREHENSIVE METABOLIC PANEL - Abnormal; Notable for the following components:      Result Value   Glucose, Bld 102 (*)    Creatinine, Ser 1.07 (*)    Total Protein 8.3 (*)    GFR, Estimated 59 (*)    All other components within normal limits  CBC WITH DIFFERENTIAL/PLATELET - Abnormal; Notable for the following components:   MCV 76.3 (*)    MCH 24.1 (*)    All other components within normal limits  URINALYSIS, ROUTINE W REFLEX MICROSCOPIC - Abnormal; Notable for the following components:   Hgb urine dipstick TRACE (*)    Leukocytes,Ua SMALL (*)    All other components within normal limits  URINALYSIS, MICROSCOPIC (REFLEX) - Abnormal; Notable for the following components:   Bacteria, UA MANY (*)    All other components within normal limits    EKG EKG Interpretation  Date/Time:  Tuesday May 05 2020 15:16:59 EDT Ventricular Rate:  85 PR Interval:    QRS Duration: 99 QT Interval:  384 QTC Calculation: 457 R Axis:   65 Text Interpretation: Sinus rhythm Right atrial enlargement Left ventricular hypertrophy Anterior Q waves, possibly due to LVH Nonspecific  T abnormalities, inferior leads abnormal, no change from previous Confirmed by Arby Barrette [id number]) on 05/05/2020 4:37:41 PM   Radiology CT Angio Head W/Cm &/Or Wo  Cm  Result Date: 05/05/2020 CLINICAL DATA:  Vertebral artery aneurysm suspected. Additional history provided: Head pain, recent diagnosis of ear infection. EXAM: CT ANGIOGRAPHY HEAD AND NECK TECHNIQUE: Multidetector CT imaging of the head and neck was performed using the standard protocol during bolus administration of intravenous contrast. Multiplanar CT image reconstructions and MIPs were obtained to evaluate the vascular anatomy. Carotid stenosis measurements (when applicable) are obtained utilizing NASCET criteria, using the distal internal carotid diameter as the denominator. CONTRAST:  OMNIPAQUE IOHEXOL 350 MG/ML SOLN COMPARISON:  Noncontrast head CT 04/22/2020 FINDINGS: CT HEAD FINDINGS Brain: Cerebral volume is normal. There is no acute intracranial hemorrhage. No demarcated cortical infarct. No extra-axial fluid collection. No evidence of intracranial mass. No midline shift. Vascular: Reported below. Skull: Normal. Negative for fracture or focal lesion. Sinuses: No significant paranasal sinus disease. Left mastoid effusion. Orbits: No acute finding. Review of the MIP images confirms the above findings CTA NECK FINDINGS Aortic arch: Common origin of the innominate and left common carotid arteries. Streak artifact from a dense right-sided contrast bolus partially obscures the right subclavian artery. No appreciate hemodynamically significant innominate or proximal subclavian artery stenosis. Right carotid system: CCA and ICA patent within the neck without stenosis. No significant atherosclerotic disease. Left carotid system: CCA and ICA patent within the neck without stenosis. No significant atherosclerotic disease. Vertebral arteries: Patent within the neck bilaterally. Streak artifact from a dense right-sided contrast bolus limits  evaluation of the origin of the right vertebral artery. Soft plaque results in apparent moderate/severe stenosis at the origin of the of the bilateral vertebral arteries. Skeleton: No acute bony abnormality or aggressive osseous lesion. Nonspecific reversal of the expected cervical lordosis. Cervical spondylosis with multilevel disc space narrowing, disc bulges and uncovertebral hypertrophy. No high-grade bony spinal canal stenosis. Multilevel bony neural foraminal narrowing. There are lytic changes within the maxilla bilaterally and right greater than left mandible with a somewhat expansile appearance at some sites. Other neck: No neck mass or cervical lymphadenopathy. Subcentimeter thyroid nodules not meeting consensus criteria for ultrasound follow-up. Upper chest: There is biapical pleuroparenchymal scarring. There is a 6 mm nodular focus abutting the pleura within the right upper lobe which may reflect a true pulmonary nodule (series 12, image 33). Review of the MIP images confirms the above findings CTA HEAD FINDINGS Anterior circulation: The intracranial internal carotid arteries are patent. The M1 middle cerebral arteries are patent without significant stenosis. No M2 proximal branch occlusion or high-grade proximal stenosis is identified. The anterior cerebral arteries are patent. No intracranial aneurysm is identified. Posterior circulation: The intracranial vertebral arteries are patent. The basilar artery is patent. The posterior cerebral arteries are patent. Posterior communicating arteries are hypoplastic or absent bilaterally. Venous sinuses: Within limitations of contrast timing, no convincing thrombus. Anatomic variants: As described Review of the MIP images confirms the above findings IMPRESSION: CT head: 1. No evidence of acute intracranial abnormality. 2. Left mastoid effusion. CTA neck: 1. The bilateral common and internal carotid arteries are patent within the neck without stenosis. 2. The  vertebral arteries are patent within the neck bilaterally. 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 No evidence of cervical vertebral artery dissection or aneurysm. 3. Biapical pleuroparenchymal scarring. A 6 mm nodular focus abutting the pleura within the right upper lobe may reflect a true pulmonary nodule. Non-contrast chest CT at 6-12 months is recommended. If the nodule is stable at time of repeat CT, then future CT at 18-24 months (from today's  scan) is considered optional for low-risk patients, but is recommended for high-risk patients. This recommendation follows the consensus statement: Guidelines for Management of Incidental Pulmonary Nodules Detected on CT Images: From the Fleischner Society 2017; Radiology 2017; 284:228-243. 4. There are lytic changes within the maxilla bilaterally and right greater than left mandible with a somewhat expansile appearance at some sites. This is favored related to dental disease. Outpatient OMFS consultation is recommended. 5. Cervical spondylosis. CTA head: 1. No intracranial large vessel occlusion or proximal high-grade arterial stenosis. 2. No intracranial aneurysm is identified. Electronically Signed   By: Jackey LogeKyle  Golden DO   On: 05/05/2020 17:50   CT ANGIO NECK W OR WO CONTRAST  Result Date: 05/05/2020 CLINICAL DATA:  Vertebral artery aneurysm suspected. Additional history provided: Head pain, recent diagnosis of ear infection. EXAM: CT ANGIOGRAPHY HEAD AND NECK TECHNIQUE: Multidetector CT imaging of the head and neck was performed using the standard protocol during bolus administration of intravenous contrast. Multiplanar CT image reconstructions and MIPs were obtained to evaluate the vascular anatomy. Carotid stenosis measurements (when applicable) are obtained utilizing NASCET criteria, using the distal internal carotid diameter as the denominator. CONTRAST:  100mL OMNIPAQUE IOHEXOL 350 MG/ML SOLN COMPARISON:   Noncontrast head CT 04/22/2020 FINDINGS: CT HEAD FINDINGS Brain: Cerebral volume is normal. There is no acute intracranial hemorrhage. No demarcated cortical infarct. No extra-axial fluid collection. No evidence of intracranial mass. No midline shift. Vascular: Reported below. Skull: Normal. Negative for fracture or focal lesion. Sinuses: No significant paranasal sinus disease. Left mastoid effusion. Orbits: No acute finding. Review of the MIP images confirms the above findings CTA NECK FINDINGS Aortic arch: Common origin of the innominate and left common carotid arteries. Streak artifact from a dense right-sided contrast bolus partially obscures the right subclavian artery. No appreciate hemodynamically significant innominate or proximal subclavian artery stenosis. Right carotid system: CCA and ICA patent within the neck without stenosis. No significant atherosclerotic disease. Left carotid system: CCA and ICA patent within the neck without stenosis. No significant atherosclerotic disease. Vertebral arteries: Patent within the neck bilaterally. Streak artifact from a dense right-sided contrast bolus limits evaluation of the origin of the right vertebral artery. Soft plaque results in apparent moderate/severe stenosis at the origin of the of the bilateral vertebral arteries. Skeleton: No acute bony abnormality or aggressive osseous lesion. Nonspecific reversal of the expected cervical lordosis. Cervical spondylosis with multilevel disc space narrowing, disc bulges and uncovertebral hypertrophy. No high-grade bony spinal canal stenosis. Multilevel bony neural foraminal narrowing. There are lytic changes within the maxilla bilaterally and right greater than left mandible with a somewhat expansile appearance at some sites. Other neck: No neck mass or cervical lymphadenopathy. Subcentimeter thyroid nodules not meeting consensus criteria for ultrasound follow-up. Upper chest: There is biapical pleuroparenchymal scarring.  There is a 6 mm nodular focus abutting the pleura within the right upper lobe which may reflect a true pulmonary nodule (series 12, image 33). Review of the MIP images confirms the above findings CTA HEAD FINDINGS Anterior circulation: The intracranial internal carotid arteries are patent. The M1 middle cerebral arteries are patent without significant stenosis. No M2 proximal branch occlusion or high-grade proximal stenosis is identified. The anterior cerebral arteries are patent. No intracranial aneurysm is identified. Posterior circulation: The intracranial vertebral arteries are patent. The basilar artery is patent. The posterior cerebral arteries are patent. Posterior communicating arteries are hypoplastic or absent bilaterally. Venous sinuses: Within limitations of contrast timing, no convincing thrombus. Anatomic variants: As described Review of the  MIP images confirms the above findings IMPRESSION: CT head: 1. No evidence of acute intracranial abnormality. 2. Left mastoid effusion. CTA neck: 1. The bilateral common and internal carotid arteries are patent within the neck without stenosis. 2. The vertebral arteries are patent within the neck bilaterally. 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 No evidence of cervical vertebral artery dissection or aneurysm. 3. Biapical pleuroparenchymal scarring. A 6 mm nodular focus abutting the pleura within the right upper lobe may reflect a true pulmonary nodule. Non-contrast chest CT at 6-12 months is recommended. If the nodule is stable at time of repeat CT, then future CT at 18-24 months (from today's scan) is considered optional for low-risk patients, but is recommended for high-risk patients. This recommendation follows the consensus statement: Guidelines for Management of Incidental Pulmonary Nodules Detected on CT Images: From the Fleischner Society 2017; Radiology 2017; 284:228-243. 4. There are lytic changes within  the maxilla bilaterally and right greater than left mandible with a somewhat expansile appearance at some sites. This is favored related to dental disease. Outpatient OMFS consultation is recommended. 5. Cervical spondylosis. CTA head: 1. No intracranial large vessel occlusion or proximal high-grade arterial stenosis. 2. No intracranial aneurysm is identified. Electronically Signed   By: Jackey Loge DO   On: 05/05/2020 17:50    Procedures Procedures (including critical care time)  Medications Ordered in ED Medications  carvedilol (COREG) tablet 6.25 mg (6.25 mg Oral Given 05/05/20 1837)  0.9 %  sodium chloride infusion ( Intravenous New Bag/Given 05/05/20 1604)  iohexol (OMNIPAQUE) 350 MG/ML injection 100 mL (100 mLs Intravenous Contrast Given 05/05/20 1652)  acetaminophen (TYLENOL) tablet 1,000 mg (1,000 mg Oral Given 05/05/20 1836)    ED Course  I have reviewed the triage vital signs and the nursing notes.  Pertinent labs & imaging results that were available during my care of the patient were reviewed by me and considered in my medical decision making (see chart for details).    MDM Rules/Calculators/A&P                          Patient has had severely elevated blood pressure that has been untreated for 20 years.  Patient reports that she has never had any significant symptoms with that and does not take medications although it has been recommended in the past.  She reports her philosophy has been that God is taking care of her and she has not had any problems with her hypertension.  We had a long discussion about the silent nature of hypertension and the sudden development of severe complications including stroke, heart attack and kidney failure.  Patient voices understanding.  Will start on Coreg 6.25 mg twice daily.  She wished to start on the lowest possible dose.  I have encouraged her to start at 6.25 and measure her pressures 2-3 times a day.  Patient does not smoke.  She does not  use alcohol.  She does not have other risk factors besides severe uncontrolled hypertension.  I do suspect the headache that she is experiencing is secondary to a wisdom tooth that appears severely impacted.  She does not have facial swelling or tenderness however the gum tissues are hypertrophic and irregular with what appears to be some bony overgrowth of the upper wisdom tooth.  Patient is aware she needs to see dentist ASAP for further evaluation of this. Final Clinical Impression(s) / ED Diagnoses Final diagnoses:  Hypertensive  urgency  Bad headache    Rx / DC Orders ED Discharge Orders         Ordered    carvedilol (COREG) 6.25 MG tablet  2 times daily with meals        05/05/20 1913           Arby Barrette, MD 05/05/20 1923

## 2020-05-05 NOTE — ED Notes (Signed)
Completed by someone else

## 2020-05-05 NOTE — ED Triage Notes (Signed)
Head pain. She was here 2 weeks ago with headache. She was diagnosed with an ear infection.

## 2020-05-05 NOTE — ED Notes (Signed)
Pt eating apple sauce from home, pt drinking water without difficulty.

## 2020-06-03 ENCOUNTER — Other Ambulatory Visit: Payer: Self-pay

## 2020-06-03 ENCOUNTER — Emergency Department (HOSPITAL_BASED_OUTPATIENT_CLINIC_OR_DEPARTMENT_OTHER)
Admission: EM | Admit: 2020-06-03 | Discharge: 2020-06-03 | Disposition: A | Discharge status: Home / Self Care | Payer: Self-pay | Attending: Emergency Medicine | Admitting: Emergency Medicine

## 2020-06-03 ENCOUNTER — Encounter (HOSPITAL_BASED_OUTPATIENT_CLINIC_OR_DEPARTMENT_OTHER): Payer: Self-pay | Admitting: *Deleted

## 2020-06-03 DIAGNOSIS — R519 Headache, unspecified: Secondary | ICD-10-CM

## 2020-06-03 DIAGNOSIS — Z79899 Other long term (current) drug therapy: Secondary | ICD-10-CM | POA: Insufficient documentation

## 2020-06-03 DIAGNOSIS — I10 Essential (primary) hypertension: Secondary | ICD-10-CM | POA: Insufficient documentation

## 2020-06-03 DIAGNOSIS — R9431 Abnormal electrocardiogram [ECG] [EKG]: Secondary | ICD-10-CM | POA: Insufficient documentation

## 2020-06-03 LAB — CBC WITH DIFFERENTIAL/PLATELET
Abs Immature Granulocytes: 0.01 10*3/uL (ref 0.00–0.07)
Basophils Absolute: 0 10*3/uL (ref 0.0–0.1)
Basophils Relative: 1 %
Eosinophils Absolute: 0 10*3/uL (ref 0.0–0.5)
Eosinophils Relative: 1 %
HCT: 36.7 % (ref 36.0–46.0)
Hemoglobin: 11.3 g/dL — ABNORMAL LOW (ref 12.0–15.0)
Immature Granulocytes: 0 %
Lymphocytes Relative: 31 %
Lymphs Abs: 1.7 10*3/uL (ref 0.7–4.0)
MCH: 23.7 pg — ABNORMAL LOW (ref 26.0–34.0)
MCHC: 30.8 g/dL (ref 30.0–36.0)
MCV: 77.1 fL — ABNORMAL LOW (ref 80.0–100.0)
Monocytes Absolute: 0.3 10*3/uL (ref 0.1–1.0)
Monocytes Relative: 6 %
Neutro Abs: 3.4 10*3/uL (ref 1.7–7.7)
Neutrophils Relative %: 61 %
Platelets: 255 10*3/uL (ref 150–400)
RBC: 4.76 MIL/uL (ref 3.87–5.11)
RDW: 14.8 % (ref 11.5–15.5)
WBC: 5.6 10*3/uL (ref 4.0–10.5)
nRBC: 0 % (ref 0.0–0.2)

## 2020-06-03 LAB — BASIC METABOLIC PANEL
Anion gap: 9 (ref 5–15)
BUN: 13 mg/dL (ref 6–20)
CO2: 31 mmol/L (ref 22–32)
Calcium: 9.6 mg/dL (ref 8.9–10.3)
Chloride: 100 mmol/L (ref 98–111)
Creatinine, Ser: 0.89 mg/dL (ref 0.44–1.00)
GFR, Estimated: >60 mL/min (ref >60)
Glucose, Bld: 96 mg/dL (ref 70–99)
Potassium: 3.5 mmol/L (ref 3.5–5.1)
Sodium: 140 mmol/L (ref 135–145)

## 2020-06-03 MED ORDER — HYDROCHLOROTHIAZIDE 12.5 MG PO TABS: 12.5000 mg | ORAL_TABLET | Freq: Every day | ORAL | 0 refills | Status: DC

## 2020-06-03 MED ORDER — SODIUM CHLORIDE 0.9 % IV BOLUS
1000.0000 mL | Freq: Once | INTRAVENOUS | Status: AC
  Administered 2020-06-03: 1000 mL via INTRAVENOUS

## 2020-06-03 MED ORDER — DEXAMETHASONE SODIUM PHOSPHATE 4 MG/ML IJ SOLN
4.0000 mg | Freq: Once | INTRAMUSCULAR | Status: AC
  Administered 2020-06-03: 4 mg via INTRAVENOUS
  Filled 2020-06-03: qty 1

## 2020-06-03 MED ORDER — CARVEDILOL 6.25 MG PO TABS
6.2500 mg | ORAL_TABLET | Freq: Once | ORAL | Status: AC
  Administered 2020-06-03: 6.25 mg via ORAL
  Filled 2020-06-03: qty 1

## 2020-06-03 MED ORDER — HYDROCHLOROTHIAZIDE 25 MG PO TABS
12.5000 mg | ORAL_TABLET | Freq: Once | ORAL | Status: AC
  Administered 2020-06-03: 12.5 mg via ORAL
  Filled 2020-06-03: qty 1

## 2020-06-03 MED ORDER — CARVEDILOL 6.25 MG PO TABS: 6.2500 mg | ORAL_TABLET | Freq: Two times a day (BID) | ORAL | 0 refills | Status: DC

## 2020-06-03 MED ORDER — METOCLOPRAMIDE HCL 5 MG/ML IJ SOLN
10.0000 mg | Freq: Once | INTRAMUSCULAR | Status: AC
  Administered 2020-06-03: 10 mg via INTRAVENOUS
  Filled 2020-06-03: qty 2

## 2020-06-03 MED ORDER — DIPHENHYDRAMINE HCL 50 MG/ML IJ SOLN
50.0000 mg | Freq: Once | INTRAMUSCULAR | Status: AC
  Administered 2020-06-03: 50 mg via INTRAVENOUS
  Filled 2020-06-03: qty 1

## 2020-06-03 NOTE — Discharge Instructions (Signed)
Please pick up medications and take as prescribed. Keep a log of your blood pressures daily.   We have placed a referral to the cardiologist - they will contact you to schedule an appointment for blood pressure management.   Return to the ED IMMEDIATELY for any worsening symptoms including severe sudden onset headache, blurry vision/double vision, confusion, speech changes (slurring of words or inability to get words out), weakness or numbness on one side of your body, facial drooping, chest pain, shortness of breath, or any other new/worsening symptoms.

## 2020-06-03 NOTE — ED Triage Notes (Addendum)
C/o h/a x 1 month , increased pain with movt

## 2020-06-03 NOTE — ED Provider Notes (Signed)
[facility] EMERGENCY DEPARTMENT Provider Note   CSN: [id number] Arrival date & time: 06/03/20  1709     History Chief Complaint  Patient presents with  . Headache    Charlene [name] is a 54 y.o. female with PMHx uncontrolled HTN who presents to the ED with complaint of gradual onset, constant, sharp, right sided HA x 2 months. Pt has been seen twice in the ED for same with noted elevated BP as high as 230 systolic.Workup included CT Head as well as CTA Head and Neck and labwork which were all reassuring. It appears that during each of these visits patient was advised about her blood pressure likely causing her headaches - the first time pt wanted her right ear evaluated as she thought this may be causing her symptoms. She had a mild amount of fluid in the ear and was treated for an ear infection without relief of her headache. She was subsequently seen again and found to have an impacted wisdom tooth on the right side that she has since gotten removed without improvement in her headaches. During her last ED visit on 10/19 pt was started on 6.25 mg Coreg BID reluctantly; she wanted to start on the smallest dose of medications. She has been taking this daily without any improvement in her blood pressure. Pt is mostly here to address her headache that has not resolved. She denies any other symptoms including blurry vision, double vision, neck stiffness, rash, confusion, speech changes, unilateral weakness or numbness, chest pain, SOB, or any other associated symptoms.   The history is provided by the patient and medical records.       Past Medical History:  Diagnosis Date  . Hypertension     Patient Active Problem List   Diagnosis Date Noted  . Anxiety state, unspecified 03/11/2014  . Essential hypertension 03/11/2014  . Essential hypertension, benign 02/28/2014  . Accelerated hypertension 02/21/2014  . Nausea & vomiting 02/21/2014  . Headache 02/21/2014  . Chest pain  02/21/2014    Past Surgical History:  Procedure Laterality Date  . CESAREAN SECTION       OB History    Gravida  9   Para  6   Term  6   Preterm      AB  2   Living  6     SAB  2   TAB      Ectopic      Multiple      Live Births  6           Family History  Problem Relation Age of Onset  . Hypertension Father   . Cancer Maternal Grandmother   . Thyroid disease Paternal Uncle     Social History   Tobacco Use  . Smoking status: Never Smoker  . Smokeless tobacco: Never Used  Substance Use Topics  . Alcohol use: No  . Drug use: No    Home Medications Prior to Admission medications   Medication Sig Start Date End Date Taking? Authorizing Provider  carvedilol (COREG) 6.25 MG tablet Take 1 tablet (6.25 mg total) by mouth 2 (two) times daily with a meal. 05/05/20   Arby Barrette, MD  carvedilol (COREG) 6.25 MG tablet Take 1 tablet (6.25 mg total) by mouth 2 (two) times daily with a meal. 06/03/20   Mychael Soots, PA-C  hydrochlorothiazide (HYDRODIURIL) 12.5 MG tablet Take 1 tablet (12.5 mg total) by mouth daily. 06/03/20 07/03/20  Tanda Rockers, PA-C  metoprolol tartrate (LOPRESSOR)  25 MG tablet Take 50 mg by mouth daily. 03/10/14 06/03/20  Doris CheadleAdvani, Deepak, MD    Allergies    Patient has no known allergies.  Review of Systems   Review of Systems  Constitutional: Negative for chills and fever.  Eyes: Negative for visual disturbance.  Gastrointestinal: Negative for nausea and vomiting.  Musculoskeletal: Negative for neck pain and neck stiffness.  Skin: Negative for rash.  Neurological: Positive for headaches. Negative for dizziness, syncope, speech difficulty, weakness and numbness.  All other systems reviewed and are negative.   Physical Exam Updated Vital Signs BP (!) 200/129 (BP Location: Left Arm)   Pulse 81   Temp 98.1 F (36.7 C) (Oral)   Resp 16   LMP 03/03/2014   SpO2 100%   Physical Exam Vitals and nursing note reviewed.    Constitutional:      Appearance: She is not ill-appearing or diaphoretic.  HENT:     Head: Normocephalic and atraumatic.  Eyes:     Extraocular Movements: Extraocular movements intact.     Conjunctiva/sclera: Conjunctivae normal.     Pupils: Pupils are equal, round, and reactive to light.  Neck:     Meningeal: Brudzinski's sign and Kernig's sign absent.  Cardiovascular:     Rate and Rhythm: Normal rate and regular rhythm.  Pulmonary:     Effort: Pulmonary effort is normal.     Breath sounds: Normal breath sounds. No wheezing, rhonchi or rales.  Abdominal:     Palpations: Abdomen is soft.     Tenderness: There is no abdominal tenderness.  Musculoskeletal:     Cervical back: Normal range of motion and neck supple. No rigidity.  Skin:    General: Skin is warm and dry.  Neurological:     Mental Status: She is alert.     Comments: CN 3-12 grossly intact A&O x4 GCS 15 Sensation and strength intact Gait nonataxic including with tandem walking Coordination with finger-to-nose WNL Neg romberg, neg pronator drift     ED Results / Procedures / Treatments   Labs (all labs ordered are listed, but only abnormal results are displayed) Labs Reviewed  CBC WITH DIFFERENTIAL/PLATELET - Abnormal; Notable for the following components:      Result Value   Hemoglobin 11.3 (*)    MCV 77.1 (*)    MCH 23.7 (*)    All other components within normal limits  BASIC METABOLIC PANEL    EKG EKG Interpretation  Date/Time:  Wednesday June 03 2020 20:07:20 EST Ventricular Rate:  82 PR Interval:    QRS Duration: 99 QT Interval:  400 QTC Calculation: 468 R Axis:   75 Text Interpretation: Sinus rhythm Consider right atrial enlargement Left ventricular hypertrophy ST elevation, consider anterior injury ST segment changes in anterior leads unchanged from prior no acute STEMI Confirmed by Marianna Fussykstra, Richard ([id number]) on 06/03/2020 8:24:33 PM   Radiology No results  found.  Procedures Procedures (including critical care time)  Medications Ordered in ED Medications  carvedilol (COREG) tablet 6.25 mg (6.25 mg Oral Given 06/03/20 1951)  hydrochlorothiazide (HYDRODIURIL) tablet 12.5 mg (12.5 mg Oral Given 06/03/20 2009)  sodium chloride 0.9 % bolus 1,000 mL (1,000 mLs Intravenous New Bag/Given 06/03/20 1956)  metoCLOPramide (REGLAN) injection 10 mg (10 mg Intravenous Given 06/03/20 1959)  dexamethasone (DECADRON) injection 4 mg (4 mg Intravenous Given 06/03/20 1957)  diphenhydrAMINE (BENADRYL) injection 50 mg (50 mg Intravenous Given 06/03/20 2000)    ED Course  I have reviewed the triage vital signs and the nursing  notes.  Pertinent labs & imaging results that were available during my care of the patient were reviewed by me and considered in my medical decision making (see chart for details).    MDM Rules/Calculators/A&P                          54 year old female with a past medical history of uncontrolled hypertension who presents to the ED today with complaint of constant right-sided headache for the past 2 months.  Previous two ED visits with significantly elevated blood pressure; she was subsequently started on 6.25 mg Coreg twice daily 10/19 without improvement of her blood pressure or her headaches.  She returns today with constant headache.  On arrival to the ED patient is afebrile, nontachycardic and nontachypneic.  Her blood pressure on arrival is 196/151 with repeat 200/129.  She has no focal neuro deficits or meningeal signs on exam today.  As suspected during previous to ED visits with negative work-up including negative Noncon CT head as well as negative CTA head and neck it is likely that patient's headache is related to hypertension significantly uncontrolled.  Patient continues to state that God will take care of her high blood pressure as he has for the past 20 to 30 years without any adverse events.  Had lengthy discussion with patient as  well as her partner at bedside that her blood pressure has been so uncontrolled for so long that it will likely take several agents and a lot of increasing/decreasing/changing of medications by primary care physician to adequately control her blood pressure which will then subsequently adequately control her headache.  She is currently in the process of finding a PCP, does not have 1 currently.  We will plan to give patient her regular dose of Coreg in the ED as she has not taken it today, start her on a new agent.  It does appear in 2015 she was on HCTZ 12.5 mg as well as Lopressor 50 mg.  Start her on HCTZ as well.  Will obtain lab works at this time and provide headache cocktail with reevaluation.  Patient does not want to be admitted for hypertensive urgency today.  I do not feel a repeat CT scan will be of much use today.  Nursing staff called me back into the room as patient was hesitant about starting an additional medication.  I again had a lengthy discussion with her regarding the fact that her blood pressure is so and adequately controlled currently that she will likely need more than her 6.25 of Coreg.  She is reluctant but is willing to start this.  She does continues to states she hopes it is not long-term, it does appear that attending physician during previous ED visit had a lengthy discussion with her regarding the effects of silent high blood pressure in the multitude of adverse events that could occur.  I again reiterated this to her.  CBC without leukocytosis. Hgb stable at 11.3 BMP with normal creaitnine at 0.89 EKG does show some ST changes however unchanged from previous. Pt without any complaints of chest pain, SOB, nausea, vomiting, diaphoresis.   Attending physician Dr. Stevie Kern has evaluated patient as well; she reports some improvement in her symptoms with headache cocktail. Repeat BP 184/126 which is improved from baseline. Pt is in agreement to take her regular Coreg and the HCTZ  together for better blood pressure control. Will plan to have her follow up with cardiology for both  BP management and ST changes on EKG. Pt is in agreement with plan and stable for discharge.   This note was prepared using Dragon voice recognition software and may include unintentional dictation errors due to the inherent limitations of voice recognition software.  Final Clinical Impression(s) / ED Diagnoses Final diagnoses:  Primary hypertension  Bad headache  EKG abnormalities    Rx / DC Orders ED Discharge Orders         Ordered    Ambulatory referral to Cardiology        06/03/20 2051    carvedilol (COREG) 6.25 MG tablet  2 times daily with meals        06/03/20 2053    hydrochlorothiazide (HYDRODIURIL) 12.5 MG tablet  Daily        06/03/20 2053           Discharge Instructions     Please pick up medications and take as prescribed. Keep a log of your blood pressures daily.   We have placed a referral to the cardiologist - they will contact you to schedule an appointment for blood pressure management.   Return to the ED IMMEDIATELY for any worsening symptoms including severe sudden onset headache, blurry vision/double vision, confusion, speech changes (slurring of words or inability to get words out), weakness or numbness on one side of your body, facial drooping, chest pain, shortness of breath, or any other new/worsening symptoms.        Tanda Rockers, PA-C 06/03/20 2055    Milagros Loll, MD 06/04/20 1626

## 2020-06-17 ENCOUNTER — Encounter: Payer: Self-pay | Admitting: General Practice

## 2020-07-02 ENCOUNTER — Emergency Department (HOSPITAL_BASED_OUTPATIENT_CLINIC_OR_DEPARTMENT_OTHER)
Admission: EM | Admit: 2020-07-02 | Discharge: 2020-07-02 | Disposition: A | Discharge status: Home / Self Care | Payer: Self-pay | Attending: Emergency Medicine | Admitting: Emergency Medicine

## 2020-07-02 ENCOUNTER — Encounter (HOSPITAL_BASED_OUTPATIENT_CLINIC_OR_DEPARTMENT_OTHER): Payer: Self-pay | Admitting: Emergency Medicine

## 2020-07-02 DIAGNOSIS — I10 Essential (primary) hypertension: Secondary | ICD-10-CM | POA: Insufficient documentation

## 2020-07-02 MED ORDER — HYDROCHLOROTHIAZIDE 25 MG PO TABS: 25.0000 mg | ORAL_TABLET | Freq: Every day | ORAL | 0 refills | Status: DC

## 2020-07-02 MED ORDER — HYDROCHLOROTHIAZIDE 25 MG PO TABS
25.0000 mg | ORAL_TABLET | Freq: Once | ORAL | Status: AC
  Administered 2020-07-02: 21:00:00 25 mg via ORAL
  Filled 2020-07-02: qty 1

## 2020-07-02 MED ORDER — CARVEDILOL 6.25 MG PO TABS: 6.2500 mg | ORAL_TABLET | Freq: Two times a day (BID) | ORAL | 0 refills | Status: DC

## 2020-07-02 NOTE — ED Provider Notes (Signed)
[facility] EMERGENCY DEPARTMENT Provider Note   CSN: [id number] Arrival date & time: 07/02/20  1825     History Chief Complaint  Patient presents with  . Headache    Charlene [name] is a 54 y.o. female.  HPI      Charlene [name] is a 54 y.o. female, with a history of HTN, presenting to the ED with concerns for her hypertension as well as headache. She states she ran out of her blood pressure medications about a week ago. She began to have generalized headache, but worse on the right side, about 3 days ago.  Intermittent.  She has had similar headaches with previous instances of hypertension. She states she has "tried to call to make an appointment" for a primary care provider.  She did not follow through with recommendations for cardiology follow-up. Denies vision changes, dizziness, epistaxis, chest pain, difficulty urinating, neck pain, back pain, abdominal pain, syncope, or any other complaints.    Past Medical History:  Diagnosis Date  . Hypertension     Patient Active Problem List   Diagnosis Date Noted  . Anxiety state, unspecified 03/11/2014  . Essential hypertension 03/11/2014  . Essential hypertension, benign 02/28/2014  . Accelerated hypertension 02/21/2014  . Nausea & vomiting 02/21/2014  . Headache 02/21/2014  . Chest pain 02/21/2014    Past Surgical History:  Procedure Laterality Date  . CESAREAN SECTION       OB History    Gravida  9   Para  6   Term  6   Preterm      AB  2   Living  6     SAB  2   IAB      Ectopic      Multiple      Live Births  6           Family History  Problem Relation Age of Onset  . Hypertension Father   . Cancer Maternal Grandmother   . Thyroid disease Paternal Uncle     Social History   Tobacco Use  . Smoking status: Never Smoker  . Smokeless tobacco: Never Used  Substance Use Topics  . Alcohol use: No  . Drug use: No    Home Medications Prior to Admission medications    Medication Sig Start Date End Date Taking? Authorizing Provider  carvedilol (COREG) 6.25 MG tablet Take 1 tablet (6.25 mg total) by mouth 2 (two) times daily with a meal. 07/02/20   Astraea Gaughran C, PA-C  hydrochlorothiazide (HYDRODIURIL) 25 MG tablet Take 1 tablet (25 mg total) by mouth daily. 07/02/20 08/01/20  Elizibeth Breau C, PA-C  metoprolol tartrate (LOPRESSOR) 25 MG tablet Take 50 mg by mouth daily. 03/10/14 06/03/20  Doris Cheadle, MD    Allergies    Patient has no known allergies.  Review of Systems   Review of Systems  Constitutional: Negative for diaphoresis and fever.  Respiratory: Negative for shortness of breath.   Cardiovascular: Negative for chest pain and leg swelling.  Gastrointestinal: Negative for abdominal pain, nausea and vomiting.  Genitourinary: Negative for difficulty urinating.  Musculoskeletal: Negative for neck pain.  Neurological: Positive for headaches. Negative for dizziness, syncope, weakness and numbness.  All other systems reviewed and are negative.   Physical Exam Updated Vital Signs BP (!) 192/108 (BP Location: Right Arm)   Pulse 84   Temp 98.4 F (36.9 C) (Oral)   Resp 18   Ht 5\' 5"  (1.651 m)   Wt  61.2 kg   LMP 03/03/2014   SpO2 100%   BMI 22.45 kg/m   Physical Exam Vitals and nursing note reviewed.  Constitutional:      General: She is not in acute distress.    Appearance: She is well-developed. She is not diaphoretic.  HENT:     Head: Normocephalic and atraumatic.     Mouth/Throat:     Mouth: Mucous membranes are moist.     Pharynx: Oropharynx is clear.  Eyes:     Conjunctiva/sclera: Conjunctivae normal.  Cardiovascular:     Rate and Rhythm: Normal rate and regular rhythm.     Pulses: Normal pulses.          Radial pulses are 2+ on the right side and 2+ on the left side.       Posterior tibial pulses are 2+ on the right side and 2+ on the left side.     Heart sounds: Normal heart sounds.     Comments: Tactile temperature in the  extremities appropriate and equal bilaterally. Pulmonary:     Effort: Pulmonary effort is normal. No respiratory distress.     Breath sounds: Normal breath sounds.  Abdominal:     Palpations: Abdomen is soft.     Tenderness: There is no abdominal tenderness. There is no guarding.  Musculoskeletal:     Cervical back: Neck supple.     Right lower leg: No edema.     Left lower leg: No edema.  Skin:    General: Skin is warm and dry.  Neurological:     Mental Status: She is alert and oriented to person, place, and time.     Comments: No noted acute cognitive deficit. Sensation grossly intact to light touch in the extremities.   Grip strengths equal bilaterally.   Strength 5/5 in all extremities.  No gait disturbance.  Coordination intact.  Cranial nerves III-XII grossly intact.  Handles oral secretions without noted difficulty.  No noted phonation or speech deficit. No facial droop.   Psychiatric:        Mood and Affect: Mood and affect normal.        Speech: Speech normal.        Behavior: Behavior normal.     ED Results / Procedures / Treatments   Labs (all labs ordered are listed, but only abnormal results are displayed) Labs Reviewed - No data to display  EKG None  Radiology No results found.  Procedures Procedures (including critical care time)  Medications Ordered in ED Medications  hydrochlorothiazide (HYDRODIURIL) tablet 25 mg (25 mg Oral Given 07/02/20 2030)    ED Course  I have reviewed the triage vital signs and the nursing notes.  Pertinent labs & imaging results that were available during my care of the patient were reviewed by me and considered in my medical decision making (see chart for details).    MDM Rules/Calculators/A&P                          Patient presents with headache in the setting of hypertension.  No focal neurologic deficits.  No chest pain, shortness of breath, or changes in urinary function. My suspicion for hypertensive  emergency is low. She has not followed through with requests to establish with a PCP.  The importance of follow-up was again stressed.  She was given multiple avenues for follow-up. She declined additional treatments for her headache. The patient was given instructions for home care as  well as return precautions. Patient voices understanding of these instructions, accepts the plan, and is comfortable with discharge.  Findings and plan of care discussed with attending physician, Marianna Fuss, MD. Dr. Stevie Kern personally evaluated and examined this patient.   Vitals:   07/02/20 1956 07/02/20 2029 07/02/20 2030 07/02/20 2059  BP: (!) 192/108 (!) 182/109 (!) 189/110 (!) 188/112  Pulse: 84 84 82 82  Resp: 18 16 16 16   Temp:      TempSrc:      SpO2: 100% 100% 100% 100%  Weight:      Height:         Final Clinical Impression(s) / ED Diagnoses Final diagnoses:  Primary hypertension    Rx / DC Orders ED Discharge Orders         Ordered    hydrochlorothiazide (HYDRODIURIL) 25 MG tablet  Daily        07/02/20 2045    carvedilol (COREG) 6.25 MG tablet  2 times daily with meals        07/02/20 2047           2048, PA-C 07/03/20 0055    07/05/20, MD 07/04/20 1222

## 2020-07-02 NOTE — Discharge Instructions (Signed)
Take the blood pressure medication, as prescribed. Follow-up with a primary care provider on this matter.  Call the number provided.  Keep calling the number until you have an appointment date. It would also be helpful to follow-up with cardiology due to persistent hypertension and for underlying evaluation.  Call the number provided to obtain an appointment.

## 2020-07-02 NOTE — ED Notes (Signed)
ED Provider at bedside. 

## 2020-07-02 NOTE — ED Notes (Signed)
Comfort measures provided, lights dimmed in room

## 2020-07-02 NOTE — ED Triage Notes (Signed)
Headaches, out of b/p medicine. For 1 week. Got r/x for HTN here but has not seen a PMD

## 2020-07-02 NOTE — ED Notes (Signed)
Spoke with and informed ED provider of current NBP values.

## 2020-07-02 NOTE — ED Notes (Signed)
Presents with HA, states has been taking HCTZ with other AntiHypertensive, but is now out of HCTZ.

## 2020-07-02 NOTE — ED Notes (Signed)
Resting quietly in room, voices no complaints at this time

## 2020-08-19 ENCOUNTER — Encounter (HOSPITAL_BASED_OUTPATIENT_CLINIC_OR_DEPARTMENT_OTHER): Payer: Self-pay

## 2020-08-19 ENCOUNTER — Emergency Department (HOSPITAL_BASED_OUTPATIENT_CLINIC_OR_DEPARTMENT_OTHER)
Admission: EM | Admit: 2020-08-19 | Discharge: 2020-08-19 | Disposition: A | Discharge status: Home / Self Care | Payer: Medicaid Other | Attending: Emergency Medicine | Admitting: Emergency Medicine

## 2020-08-19 ENCOUNTER — Other Ambulatory Visit: Payer: Self-pay

## 2020-08-19 DIAGNOSIS — I10 Essential (primary) hypertension: Secondary | ICD-10-CM | POA: Diagnosis not present

## 2020-08-19 DIAGNOSIS — Z79899 Other long term (current) drug therapy: Secondary | ICD-10-CM | POA: Diagnosis not present

## 2020-08-19 LAB — CBC
HCT: 36.2 % (ref 36.0–46.0)
Hemoglobin: 11.4 g/dL — ABNORMAL LOW (ref 12.0–15.0)
MCH: 24.5 pg — ABNORMAL LOW (ref 26.0–34.0)
MCHC: 31.5 g/dL (ref 30.0–36.0)
MCV: 77.7 fL — ABNORMAL LOW (ref 80.0–100.0)
Platelets: 269 10*3/uL (ref 150–400)
RBC: 4.66 MIL/uL (ref 3.87–5.11)
RDW: 15 % (ref 11.5–15.5)
WBC: 5.2 10*3/uL (ref 4.0–10.5)
nRBC: 0 % (ref 0.0–0.2)

## 2020-08-19 LAB — BASIC METABOLIC PANEL
Anion gap: 8 (ref 5–15)
BUN: 16 mg/dL (ref 6–20)
CO2: 30 mmol/L (ref 22–32)
Calcium: 9.6 mg/dL (ref 8.9–10.3)
Chloride: 102 mmol/L (ref 98–111)
Creatinine, Ser: 0.98 mg/dL (ref 0.44–1.00)
GFR, Estimated: >60 mL/min (ref >60)
Glucose, Bld: 101 mg/dL — ABNORMAL HIGH (ref 70–99)
Potassium: 3.4 mmol/L — ABNORMAL LOW (ref 3.5–5.1)
Sodium: 140 mmol/L (ref 135–145)

## 2020-08-19 LAB — TROPONIN I (HIGH SENSITIVITY): Troponin I (High Sensitivity): 4 ng/L

## 2020-08-19 MED ORDER — HYDROCHLOROTHIAZIDE 25 MG PO TABS
25.0000 mg | ORAL_TABLET | Freq: Once | ORAL | Status: AC
  Administered 2020-08-19: 25 mg via ORAL
  Filled 2020-08-19: qty 1

## 2020-08-19 MED ORDER — HYDROCHLOROTHIAZIDE 25 MG PO TABS
25.0000 mg | ORAL_TABLET | Freq: Every day | ORAL | 0 refills | Status: DC
Start: 2020-08-19 — End: 2021-04-16

## 2020-08-19 MED ORDER — CARVEDILOL 6.25 MG PO TABS: 6.2500 mg | ORAL_TABLET | Freq: Two times a day (BID) | ORAL | 0 refills | Status: DC

## 2020-08-19 MED ORDER — CARVEDILOL 6.25 MG PO TABS
6.2500 mg | ORAL_TABLET | Freq: Two times a day (BID) | ORAL | Status: DC
  Administered 2020-08-19: 6.25 mg via ORAL
  Filled 2020-08-19: qty 1

## 2020-08-19 NOTE — ED Triage Notes (Signed)
Pt complaining of headache since yesterday. Ran out of one of her antihypertensives a week ago. States waiting to see PCP. Had dizziness this morning. BP 239/139 in triage.

## 2020-08-19 NOTE — ED Provider Notes (Signed)
[facility] EMERGENCY DEPARTMENT Provider Note   CSN: [id number] Arrival date & time: 08/19/20  1756     History Chief Complaint  Patient presents with  . Hypertension  . Headache    Charlene [name] is a 55 y.o. female.  HPI Patient presents with headache.  Hypertension.  Headache is been since yesterday.  Around a week ago ran out of her blood pressure medicines.  States she has an appointment to see Dr. In about a month but otherwise only getting her care through the ER.  States when her blood pressure goes up she tends to get a headache.  It is dull on the front of her head.  Felt a little dizzy this morning but that felt better now.  When feeling bad when she tried to stand.  No numbness or weakness.  No chest pain.  No trouble breathing.  Had been on Coreg and hydrochlorothiazide.  States still has large variations at home when she checks her blood pressure.  However was over 200 today at home.    Past Medical History:  Diagnosis Date  . Hypertension     Patient Active Problem List   Diagnosis Date Noted  . Anxiety state, unspecified 03/11/2014  . Essential hypertension 03/11/2014  . Essential hypertension, benign 02/28/2014  . Accelerated hypertension 02/21/2014  . Nausea & vomiting 02/21/2014  . Headache 02/21/2014  . Chest pain 02/21/2014    Past Surgical History:  Procedure Laterality Date  . CESAREAN SECTION       OB History    Gravida  9   Para  6   Term  6   Preterm      AB  2   Living  6     SAB  2   IAB      Ectopic      Multiple      Live Births  6           Family History  Problem Relation Age of Onset  . Hypertension Father   . Cancer Maternal Grandmother   . Thyroid disease Paternal Uncle     Social History   Tobacco Use  . Smoking status: Never Smoker  . Smokeless tobacco: Never Used  Substance Use Topics  . Alcohol use: No  . Drug use: No    Home Medications Prior to Admission medications    Medication Sig Start Date End Date Taking? Authorizing Provider  carvedilol (COREG) 6.25 MG tablet Take 1 tablet (6.25 mg total) by mouth 2 (two) times daily with a meal. 08/19/20   Benjiman Core, MD  hydrochlorothiazide (HYDRODIURIL) 25 MG tablet Take 1 tablet (25 mg total) by mouth daily. 08/19/20 10/18/20  Benjiman Core, MD  metoprolol tartrate (LOPRESSOR) 25 MG tablet Take 50 mg by mouth daily. 03/10/14 06/03/20  Doris Cheadle, MD    Allergies    Patient has no known allergies.  Review of Systems   Review of Systems  Constitutional: Negative for appetite change.  HENT: Negative for congestion.   Respiratory: Negative for shortness of breath.   Cardiovascular: Negative for chest pain.  Gastrointestinal: Negative for abdominal distention.  Genitourinary: Negative for flank pain.  Musculoskeletal: Negative for arthralgias.  Skin: Negative for pallor.  Neurological: Positive for dizziness and headaches.  Hematological: Negative for adenopathy.  Psychiatric/Behavioral: Negative for behavioral problems.    Physical Exam Updated Vital Signs BP (!) 222/122 (BP Location: Left Arm)   Pulse 73   Temp 98.9 F (37.2 C) (  Oral)   Resp 20   Ht 5\' 5"  (1.651 m)   Wt 61.2 kg   LMP 03/03/2014   SpO2 100%   BMI 22.47 kg/m   Physical Exam Vitals and nursing note reviewed.  HENT:     Head: Atraumatic.  Eyes:     Pupils: Pupils are equal, round, and reactive to light.  Cardiovascular:     Rate and Rhythm: Normal rate and regular rhythm.  Pulmonary:     Breath sounds: No wheezing or rhonchi.  Abdominal:     Tenderness: There is no abdominal tenderness.  Musculoskeletal:     Cervical back: Neck supple.  Skin:    General: Skin is warm.     Capillary Refill: Capillary refill takes less than 2 seconds.  Neurological:     Mental Status: She is alert.     Cranial Nerves: No cranial nerve deficit.  Psychiatric:        Mood and Affect: Mood normal.        Behavior: Behavior normal.      ED Results / Procedures / Treatments   Labs (all labs ordered are listed, but only abnormal results are displayed) Labs Reviewed  BASIC METABOLIC PANEL - Abnormal; Notable for the following components:      Result Value   Potassium 3.4 (*)    Glucose, Bld 101 (*)    All other components within normal limits  CBC - Abnormal; Notable for the following components:   Hemoglobin 11.4 (*)    MCV 77.7 (*)    MCH 24.5 (*)    All other components within normal limits  TROPONIN I (HIGH SENSITIVITY)    EKG EKG Interpretation  Date/Time:  Wednesday August 19 2020 18:04:00 EST Ventricular Rate:  88 PR Interval:  134 QRS Duration: 90 QT Interval:  366 QTC Calculation: 442 R Axis:   73 Text Interpretation: Normal sinus rhythm Right atrial enlargement Minimal voltage criteria for LVH, may be normal variant ( Sokolow-Lyon ) ST & T wave abnormality, consider inferolateral ischemia Abnormal ECG No significant change since last tracing Confirmed by [id number] [phone]) on 08/19/2020 8:01:54 PM   Radiology No results found.  Procedures Procedures   Medications Ordered in ED Medications  carvedilol (COREG) tablet 6.25 mg (6.25 mg Oral Given 08/19/20 2028)  hydrochlorothiazide (HYDRODIURIL) tablet 25 mg (25 mg Oral Given 08/19/20 2027)    ED Course  I have reviewed the triage vital signs and the nursing notes.  Pertinent labs & imaging results that were available during my care of the patient were reviewed by me and considered in my medical decision making (see chart for details).    MDM Rules/Calculators/A&P                          Patient with headache and hypertension. History of same. Recently ran out of her antihypertensives and does not have PCP appointment for 6 more weeks. Non-focal exam. Some variation in BP here, but elevated. Started back on home meds. Does not have end organ damage. I think she is stable for outpatient follow up and restarted her meds. She will  continue to follow her blood pressures at home.  Final Clinical Impression(s) / ED Diagnoses Final diagnoses:  Hypertension, unspecified type    Rx / DC Orders ED Discharge Orders         Ordered    carvedilol (COREG) 6.25 MG tablet  2 times daily with meals  08/19/20 2242    hydrochlorothiazide (HYDRODIURIL) 25 MG tablet  Daily        08/19/20 2242           Benjiman Core, MD 08/19/20 2316

## 2020-08-19 NOTE — ED Notes (Signed)
Pt denies chest Pain but states that she has a headache. Pt states headache is intermitent

## 2020-08-19 NOTE — Discharge Instructions (Signed)
Follow up with the new doctor as planned. Return for worsening of symptoms, chest pain, numbness or weakness.

## 2020-08-26 ENCOUNTER — Emergency Department (HOSPITAL_BASED_OUTPATIENT_CLINIC_OR_DEPARTMENT_OTHER): Payer: Medicaid Other

## 2020-08-26 ENCOUNTER — Inpatient Hospital Stay (HOSPITAL_BASED_OUTPATIENT_CLINIC_OR_DEPARTMENT_OTHER)
Admission: EM | Admit: 2020-08-26 | Discharge: 2021-04-16 | DRG: 003 | Disposition: A | Discharge status: Skilled Nursing Facility | Payer: Medicaid Other | Attending: Family Medicine | Admitting: Family Medicine

## 2020-08-26 ENCOUNTER — Other Ambulatory Visit: Payer: Self-pay

## 2020-08-26 ENCOUNTER — Encounter (HOSPITAL_BASED_OUTPATIENT_CLINIC_OR_DEPARTMENT_OTHER): Payer: Self-pay | Admitting: Radiology

## 2020-08-26 DIAGNOSIS — Z1611 Resistance to penicillins: Secondary | ICD-10-CM | POA: Diagnosis not present

## 2020-08-26 DIAGNOSIS — G936 Cerebral edema: Secondary | ICD-10-CM | POA: Diagnosis present

## 2020-08-26 DIAGNOSIS — E870 Hyperosmolality and hypernatremia: Secondary | ICD-10-CM | POA: Diagnosis not present

## 2020-08-26 DIAGNOSIS — Z87440 Personal history of urinary (tract) infections: Secondary | ICD-10-CM

## 2020-08-26 DIAGNOSIS — Z9114 Patient's other noncompliance with medication regimen: Secondary | ICD-10-CM

## 2020-08-26 DIAGNOSIS — R509 Fever, unspecified: Secondary | ICD-10-CM

## 2020-08-26 DIAGNOSIS — N179 Acute kidney failure, unspecified: Secondary | ICD-10-CM | POA: Diagnosis present

## 2020-08-26 DIAGNOSIS — R29713 NIHSS score 13: Secondary | ICD-10-CM | POA: Diagnosis present

## 2020-08-26 DIAGNOSIS — R339 Retention of urine, unspecified: Secondary | ICD-10-CM | POA: Diagnosis not present

## 2020-08-26 DIAGNOSIS — G9341 Metabolic encephalopathy: Secondary | ICD-10-CM | POA: Diagnosis not present

## 2020-08-26 DIAGNOSIS — J156 Pneumonia due to other aerobic Gram-negative bacteria: Secondary | ICD-10-CM | POA: Diagnosis not present

## 2020-08-26 DIAGNOSIS — I619 Nontraumatic intracerebral hemorrhage, unspecified: Secondary | ICD-10-CM | POA: Diagnosis present

## 2020-08-26 DIAGNOSIS — N3000 Acute cystitis without hematuria: Secondary | ICD-10-CM

## 2020-08-26 DIAGNOSIS — Z20822 Contact with and (suspected) exposure to covid-19: Secondary | ICD-10-CM | POA: Diagnosis present

## 2020-08-26 DIAGNOSIS — H1089 Other conjunctivitis: Secondary | ICD-10-CM | POA: Diagnosis not present

## 2020-08-26 DIAGNOSIS — E46 Unspecified protein-calorie malnutrition: Secondary | ICD-10-CM | POA: Diagnosis not present

## 2020-08-26 DIAGNOSIS — N136 Pyonephrosis: Secondary | ICD-10-CM | POA: Diagnosis not present

## 2020-08-26 DIAGNOSIS — R4781 Slurred speech: Secondary | ICD-10-CM | POA: Diagnosis present

## 2020-08-26 DIAGNOSIS — G8194 Hemiplegia, unspecified affecting left nondominant side: Secondary | ICD-10-CM | POA: Diagnosis present

## 2020-08-26 DIAGNOSIS — D72819 Decreased white blood cell count, unspecified: Secondary | ICD-10-CM | POA: Diagnosis not present

## 2020-08-26 DIAGNOSIS — R0989 Other specified symptoms and signs involving the circulatory and respiratory systems: Secondary | ICD-10-CM

## 2020-08-26 DIAGNOSIS — J324 Chronic pansinusitis: Secondary | ICD-10-CM | POA: Diagnosis not present

## 2020-08-26 DIAGNOSIS — G935 Compression of brain: Secondary | ICD-10-CM | POA: Diagnosis not present

## 2020-08-26 DIAGNOSIS — J690 Pneumonitis due to inhalation of food and vomit: Secondary | ICD-10-CM | POA: Diagnosis not present

## 2020-08-26 DIAGNOSIS — E1165 Type 2 diabetes mellitus with hyperglycemia: Secondary | ICD-10-CM | POA: Diagnosis present

## 2020-08-26 DIAGNOSIS — K9421 Gastrostomy hemorrhage: Secondary | ICD-10-CM | POA: Diagnosis not present

## 2020-08-26 DIAGNOSIS — J150 Pneumonia due to Klebsiella pneumoniae: Secondary | ICD-10-CM | POA: Diagnosis not present

## 2020-08-26 DIAGNOSIS — B952 Enterococcus as the cause of diseases classified elsewhere: Secondary | ICD-10-CM | POA: Diagnosis not present

## 2020-08-26 DIAGNOSIS — Z6822 Body mass index (BMI) 22.0-22.9, adult: Secondary | ICD-10-CM

## 2020-08-26 DIAGNOSIS — M170 Bilateral primary osteoarthritis of knee: Secondary | ICD-10-CM | POA: Diagnosis present

## 2020-08-26 DIAGNOSIS — Y95 Nosocomial condition: Secondary | ICD-10-CM | POA: Diagnosis not present

## 2020-08-26 DIAGNOSIS — H65191 Other acute nonsuppurative otitis media, right ear: Secondary | ICD-10-CM | POA: Diagnosis not present

## 2020-08-26 DIAGNOSIS — H5500 Unspecified nystagmus: Secondary | ICD-10-CM | POA: Diagnosis not present

## 2020-08-26 DIAGNOSIS — K089 Disorder of teeth and supporting structures, unspecified: Secondary | ICD-10-CM

## 2020-08-26 DIAGNOSIS — Z9911 Dependence on respirator [ventilator] status: Secondary | ICD-10-CM

## 2020-08-26 DIAGNOSIS — M6289 Other specified disorders of muscle: Secondary | ICD-10-CM | POA: Diagnosis not present

## 2020-08-26 DIAGNOSIS — J189 Pneumonia, unspecified organism: Secondary | ICD-10-CM

## 2020-08-26 DIAGNOSIS — R9431 Abnormal electrocardiogram [ECG] [EKG]: Secondary | ICD-10-CM

## 2020-08-26 DIAGNOSIS — M436 Torticollis: Secondary | ICD-10-CM | POA: Diagnosis not present

## 2020-08-26 DIAGNOSIS — D638 Anemia in other chronic diseases classified elsewhere: Secondary | ICD-10-CM | POA: Diagnosis not present

## 2020-08-26 DIAGNOSIS — H6593 Unspecified nonsuppurative otitis media, bilateral: Secondary | ICD-10-CM | POA: Diagnosis present

## 2020-08-26 DIAGNOSIS — A499 Bacterial infection, unspecified: Secondary | ICD-10-CM

## 2020-08-26 DIAGNOSIS — Z8249 Family history of ischemic heart disease and other diseases of the circulatory system: Secondary | ICD-10-CM

## 2020-08-26 DIAGNOSIS — K567 Ileus, unspecified: Secondary | ICD-10-CM

## 2020-08-26 DIAGNOSIS — M2450 Contracture, unspecified joint: Secondary | ICD-10-CM | POA: Diagnosis not present

## 2020-08-26 DIAGNOSIS — J0410 Acute tracheitis without obstruction: Secondary | ICD-10-CM | POA: Diagnosis not present

## 2020-08-26 DIAGNOSIS — A4159 Other Gram-negative sepsis: Secondary | ICD-10-CM | POA: Diagnosis not present

## 2020-08-26 DIAGNOSIS — I10 Essential (primary) hypertension: Secondary | ICD-10-CM | POA: Diagnosis present

## 2020-08-26 DIAGNOSIS — Z930 Tracheostomy status: Secondary | ICD-10-CM

## 2020-08-26 DIAGNOSIS — Z7401 Bed confinement status: Secondary | ICD-10-CM

## 2020-08-26 DIAGNOSIS — R109 Unspecified abdominal pain: Secondary | ICD-10-CM

## 2020-08-26 DIAGNOSIS — Z9119 Patient's noncompliance with other medical treatment and regimen: Secondary | ICD-10-CM

## 2020-08-26 DIAGNOSIS — I6329 Cerebral infarction due to unspecified occlusion or stenosis of other precerebral arteries: Secondary | ICD-10-CM | POA: Diagnosis not present

## 2020-08-26 DIAGNOSIS — K5900 Constipation, unspecified: Secondary | ICD-10-CM | POA: Diagnosis not present

## 2020-08-26 DIAGNOSIS — H05013 Cellulitis of bilateral orbits: Secondary | ICD-10-CM | POA: Diagnosis not present

## 2020-08-26 DIAGNOSIS — Z809 Family history of malignant neoplasm, unspecified: Secondary | ICD-10-CM

## 2020-08-26 DIAGNOSIS — E785 Hyperlipidemia, unspecified: Secondary | ICD-10-CM | POA: Diagnosis present

## 2020-08-26 DIAGNOSIS — Z430 Encounter for attention to tracheostomy: Secondary | ICD-10-CM

## 2020-08-26 DIAGNOSIS — I82611 Acute embolism and thrombosis of superficial veins of right upper extremity: Secondary | ICD-10-CM | POA: Diagnosis not present

## 2020-08-26 DIAGNOSIS — N3289 Other specified disorders of bladder: Secondary | ICD-10-CM | POA: Diagnosis not present

## 2020-08-26 DIAGNOSIS — J9601 Acute respiratory failure with hypoxia: Secondary | ICD-10-CM | POA: Diagnosis not present

## 2020-08-26 DIAGNOSIS — A498 Other bacterial infections of unspecified site: Secondary | ICD-10-CM | POA: Diagnosis not present

## 2020-08-26 DIAGNOSIS — G911 Obstructive hydrocephalus: Secondary | ICD-10-CM | POA: Diagnosis present

## 2020-08-26 DIAGNOSIS — N390 Urinary tract infection, site not specified: Secondary | ICD-10-CM | POA: Diagnosis not present

## 2020-08-26 DIAGNOSIS — Z28310 Unvaccinated for covid-19: Secondary | ICD-10-CM

## 2020-08-26 DIAGNOSIS — E861 Hypovolemia: Secondary | ICD-10-CM | POA: Diagnosis not present

## 2020-08-26 DIAGNOSIS — F32A Depression, unspecified: Secondary | ICD-10-CM | POA: Diagnosis not present

## 2020-08-26 DIAGNOSIS — R482 Apraxia: Secondary | ICD-10-CM | POA: Diagnosis present

## 2020-08-26 DIAGNOSIS — Z87898 Personal history of other specified conditions: Secondary | ICD-10-CM

## 2020-08-26 DIAGNOSIS — R8290 Unspecified abnormal findings in urine: Secondary | ICD-10-CM

## 2020-08-26 DIAGNOSIS — Z8619 Personal history of other infectious and parasitic diseases: Secondary | ICD-10-CM

## 2020-08-26 DIAGNOSIS — H532 Diplopia: Secondary | ICD-10-CM | POA: Diagnosis present

## 2020-08-26 DIAGNOSIS — Z8673 Personal history of transient ischemic attack (TIA), and cerebral infarction without residual deficits: Secondary | ICD-10-CM

## 2020-08-26 DIAGNOSIS — R41841 Cognitive communication deficit: Secondary | ICD-10-CM | POA: Diagnosis present

## 2020-08-26 DIAGNOSIS — I618 Other nontraumatic intracerebral hemorrhage: Secondary | ICD-10-CM | POA: Diagnosis not present

## 2020-08-26 DIAGNOSIS — I161 Hypertensive emergency: Secondary | ICD-10-CM | POA: Diagnosis present

## 2020-08-26 DIAGNOSIS — Z515 Encounter for palliative care: Secondary | ICD-10-CM

## 2020-08-26 DIAGNOSIS — D509 Iron deficiency anemia, unspecified: Secondary | ICD-10-CM | POA: Diagnosis not present

## 2020-08-26 DIAGNOSIS — M1711 Unilateral primary osteoarthritis, right knee: Secondary | ICD-10-CM | POA: Diagnosis present

## 2020-08-26 DIAGNOSIS — Z8679 Personal history of other diseases of the circulatory system: Secondary | ICD-10-CM | POA: Diagnosis present

## 2020-08-26 DIAGNOSIS — K0889 Other specified disorders of teeth and supporting structures: Secondary | ICD-10-CM | POA: Diagnosis present

## 2020-08-26 DIAGNOSIS — R059 Cough, unspecified: Secondary | ICD-10-CM

## 2020-08-26 DIAGNOSIS — R471 Dysarthria and anarthria: Secondary | ICD-10-CM | POA: Diagnosis present

## 2020-08-26 DIAGNOSIS — I615 Nontraumatic intracerebral hemorrhage, intraventricular: Secondary | ICD-10-CM | POA: Diagnosis not present

## 2020-08-26 DIAGNOSIS — Z431 Encounter for attention to gastrostomy: Secondary | ICD-10-CM

## 2020-08-26 DIAGNOSIS — Z1612 Extended spectrum beta lactamase (ESBL) resistance: Secondary | ICD-10-CM | POA: Diagnosis not present

## 2020-08-26 DIAGNOSIS — G8929 Other chronic pain: Secondary | ICD-10-CM | POA: Diagnosis present

## 2020-08-26 DIAGNOSIS — R29810 Facial weakness: Secondary | ICD-10-CM | POA: Diagnosis present

## 2020-08-26 DIAGNOSIS — M25561 Pain in right knee: Secondary | ICD-10-CM

## 2020-08-26 DIAGNOSIS — R1312 Dysphagia, oropharyngeal phase: Secondary | ICD-10-CM | POA: Diagnosis not present

## 2020-08-26 DIAGNOSIS — S069X9A Unspecified intracranial injury with loss of consciousness of unspecified duration, initial encounter: Secondary | ICD-10-CM

## 2020-08-26 DIAGNOSIS — E876 Hypokalemia: Secondary | ICD-10-CM | POA: Diagnosis present

## 2020-08-26 DIAGNOSIS — B961 Klebsiella pneumoniae [K. pneumoniae] as the cause of diseases classified elsewhere: Secondary | ICD-10-CM | POA: Diagnosis not present

## 2020-08-26 DIAGNOSIS — Z978 Presence of other specified devices: Secondary | ICD-10-CM

## 2020-08-26 DIAGNOSIS — Z751 Person awaiting admission to adequate facility elsewhere: Secondary | ICD-10-CM

## 2020-08-26 DIAGNOSIS — L03213 Periorbital cellulitis: Secondary | ICD-10-CM | POA: Diagnosis not present

## 2020-08-26 DIAGNOSIS — J9690 Respiratory failure, unspecified, unspecified whether with hypoxia or hypercapnia: Secondary | ICD-10-CM

## 2020-08-26 DIAGNOSIS — Z789 Other specified health status: Secondary | ICD-10-CM

## 2020-08-26 DIAGNOSIS — B965 Pseudomonas (aeruginosa) (mallei) (pseudomallei) as the cause of diseases classified elsewhere: Secondary | ICD-10-CM | POA: Diagnosis not present

## 2020-08-26 DIAGNOSIS — H60393 Other infective otitis externa, bilateral: Secondary | ICD-10-CM

## 2020-08-26 DIAGNOSIS — Z79899 Other long term (current) drug therapy: Secondary | ICD-10-CM

## 2020-08-26 DIAGNOSIS — R4182 Altered mental status, unspecified: Secondary | ICD-10-CM

## 2020-08-26 DIAGNOSIS — H60503 Unspecified acute noninfective otitis externa, bilateral: Secondary | ICD-10-CM | POA: Diagnosis not present

## 2020-08-26 DIAGNOSIS — Z1624 Resistance to multiple antibiotics: Secondary | ICD-10-CM | POA: Diagnosis not present

## 2020-08-26 DIAGNOSIS — I808 Phlebitis and thrombophlebitis of other sites: Secondary | ICD-10-CM | POA: Clinically undetermined

## 2020-08-26 DIAGNOSIS — R414 Neurologic neglect syndrome: Secondary | ICD-10-CM | POA: Diagnosis not present

## 2020-08-26 DIAGNOSIS — J398 Other specified diseases of upper respiratory tract: Secondary | ICD-10-CM

## 2020-08-26 LAB — COMPREHENSIVE METABOLIC PANEL
ALT: 13 U/L (ref 0–44)
AST: 18 U/L (ref 15–41)
Albumin: 4.5 g/dL (ref 3.5–5.0)
Alkaline Phosphatase: 66 U/L (ref 38–126)
Anion gap: 14 (ref 5–15)
BUN: 29 mg/dL — ABNORMAL HIGH (ref 6–20)
CO2: 28 mmol/L (ref 22–32)
Calcium: 9.8 mg/dL (ref 8.9–10.3)
Chloride: 98 mmol/L (ref 98–111)
Creatinine, Ser: 1.09 mg/dL — ABNORMAL HIGH (ref 0.44–1.00)
GFR, Estimated: >60 mL/min (ref >60)
Glucose, Bld: 131 mg/dL — ABNORMAL HIGH (ref 70–99)
Potassium: 3.2 mmol/L — ABNORMAL LOW (ref 3.5–5.1)
Sodium: 140 mmol/L (ref 135–145)
Total Bilirubin: 0.3 mg/dL (ref 0.3–1.2)
Total Protein: 8 g/dL (ref 6.5–8.1)

## 2020-08-26 LAB — CBC
HCT: 37.3 % (ref 36.0–46.0)
Hemoglobin: 11.7 g/dL — ABNORMAL LOW (ref 12.0–15.0)
MCH: 24 pg — ABNORMAL LOW (ref 26.0–34.0)
MCHC: 31.4 g/dL (ref 30.0–36.0)
MCV: 76.6 fL — ABNORMAL LOW (ref 80.0–100.0)
Platelets: 304 10*3/uL (ref 150–400)
RBC: 4.87 MIL/uL (ref 3.87–5.11)
RDW: 14.9 % (ref 11.5–15.5)
WBC: 8.3 10*3/uL (ref 4.0–10.5)
nRBC: 0 % (ref 0.0–0.2)

## 2020-08-26 LAB — ETHANOL: Alcohol, Ethyl (B): <10 mg/dL (ref <10)

## 2020-08-26 LAB — DIFFERENTIAL
Abs Immature Granulocytes: 0.02 10*3/uL (ref 0.00–0.07)
Basophils Absolute: 0 10*3/uL (ref 0.0–0.1)
Basophils Relative: 1 %
Eosinophils Absolute: 0.1 10*3/uL (ref 0.0–0.5)
Eosinophils Relative: 1 %
Immature Granulocytes: 0 %
Lymphocytes Relative: 49 %
Lymphs Abs: 4.2 10*3/uL — ABNORMAL HIGH (ref 0.7–4.0)
Monocytes Absolute: 0.6 10*3/uL (ref 0.1–1.0)
Monocytes Relative: 7 %
Neutro Abs: 3.5 10*3/uL (ref 1.7–7.7)
Neutrophils Relative %: 42 %

## 2020-08-26 LAB — PROTIME-INR
INR: 1 (ref 0.8–1.2)
Prothrombin Time: 13.2 seconds (ref 11.4–15.2)

## 2020-08-26 LAB — RESP PANEL BY RT-PCR (FLU A&B, COVID) ARPGX2
Influenza A by PCR: NEGATIVE
Influenza B by PCR: NEGATIVE
SARS Coronavirus 2 by RT PCR: NEGATIVE

## 2020-08-26 LAB — CBG MONITORING, ED: Glucose-Capillary: 126 mg/dL — ABNORMAL HIGH (ref 70–99)

## 2020-08-26 LAB — TROPONIN I (HIGH SENSITIVITY): Troponin I (High Sensitivity): 4 ng/L (ref <18)

## 2020-08-26 LAB — APTT: aPTT: 28 seconds (ref 24–36)

## 2020-08-26 IMAGING — CT CT HEAD CODE STROKE
3 of 4 series · 15 of 47 positions shown, 18 images · non-contrast
Comparison: [DATE]

CLINICAL DATA: Code stroke.  Left arm and leg numbness.

EXAM:
CT HEAD WITHOUT CONTRAST
TECHNIQUE: Contiguous axial images were obtained from the base of the skull
through the vertex without intravenous contrast.

[Series 2: head wo · axial · 0.45mm/px · z∈[+114,+248]mm · 9 of 33 slices shown, 12 images]
[im 3/33  brain]
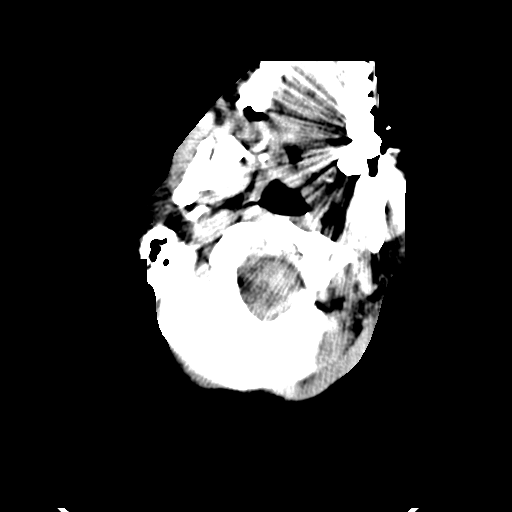
[im 3/33  bone]
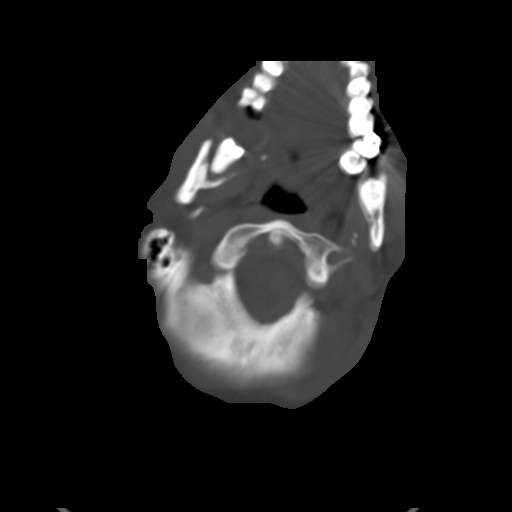
[im 7/33  brain]
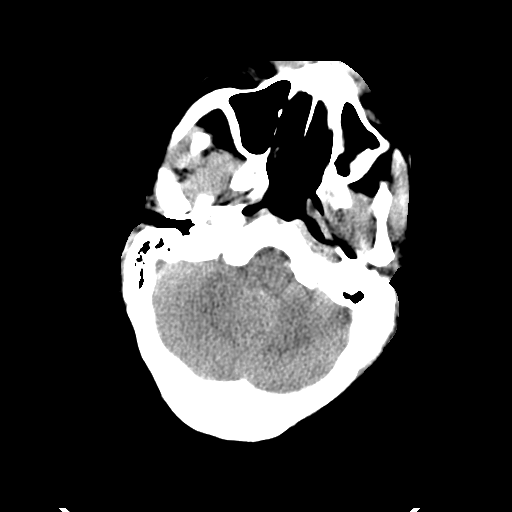
[im 10/33  brain]
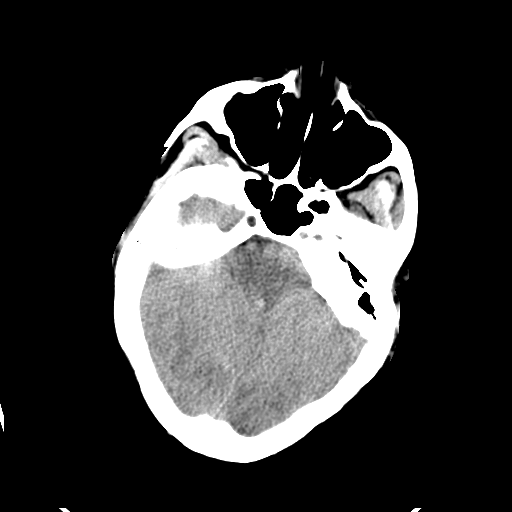
[im 14/33  brain]
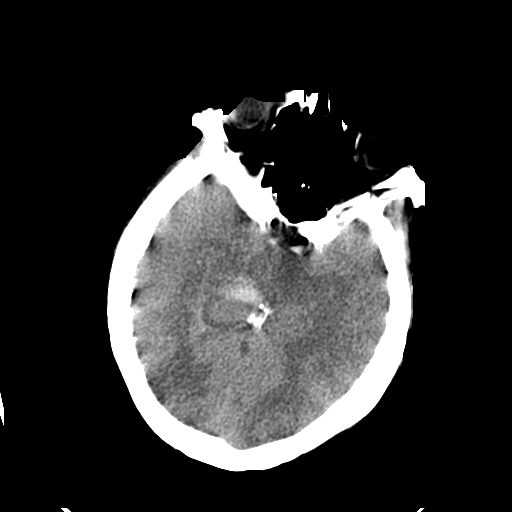
[im 17/33  brain]
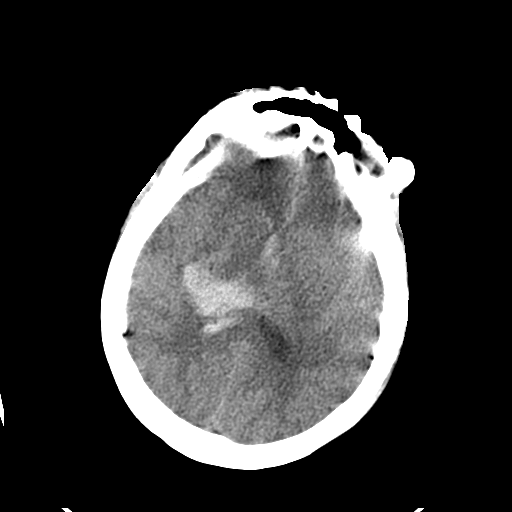
[im 17/33  bone]
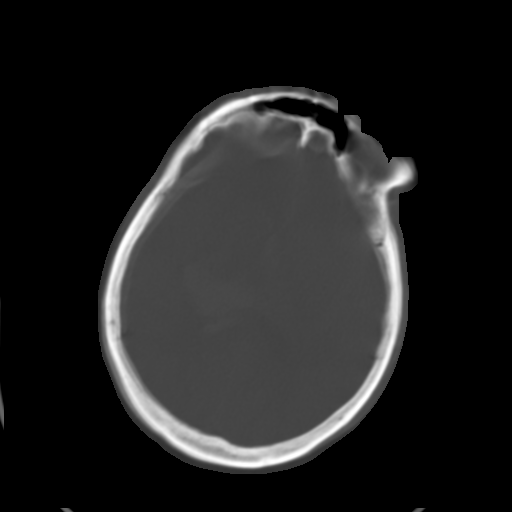
[im 19/33  brain]
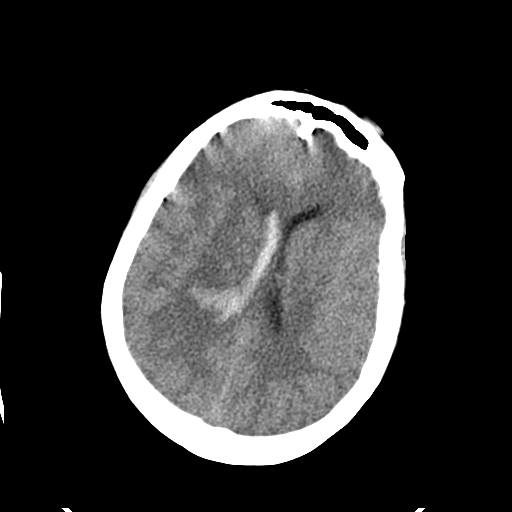
[im 23/33  brain]
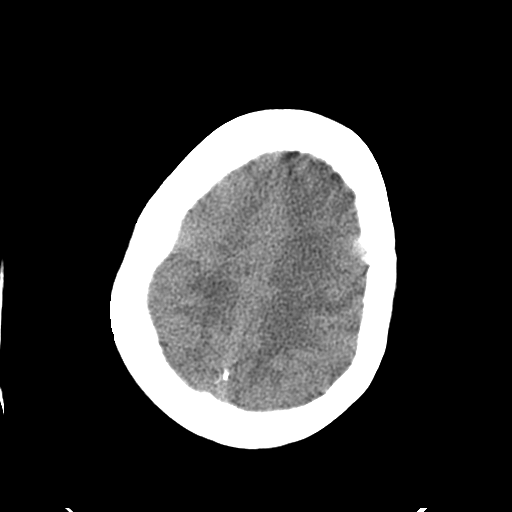
[im 26/33  brain]
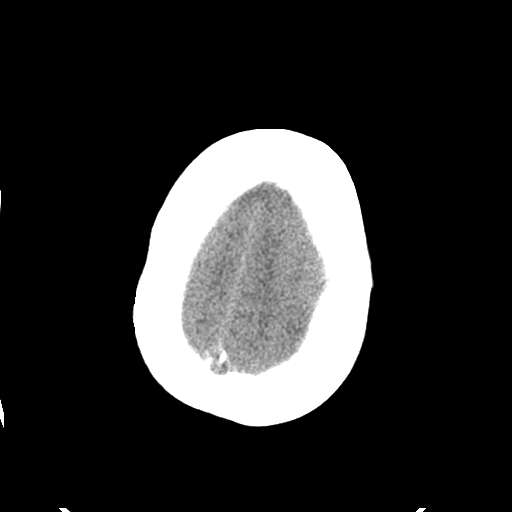
[im 30/33  brain]
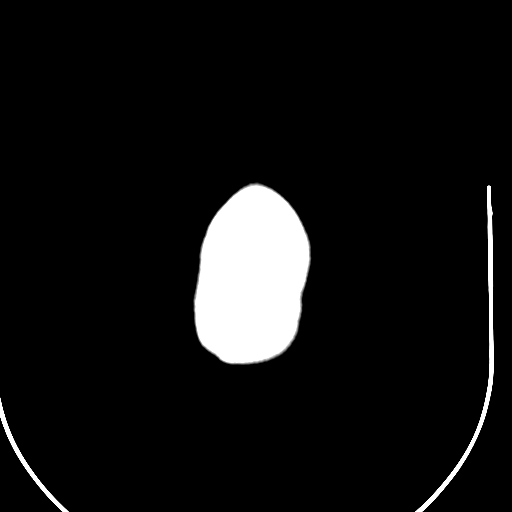
[im 30/33  bone]
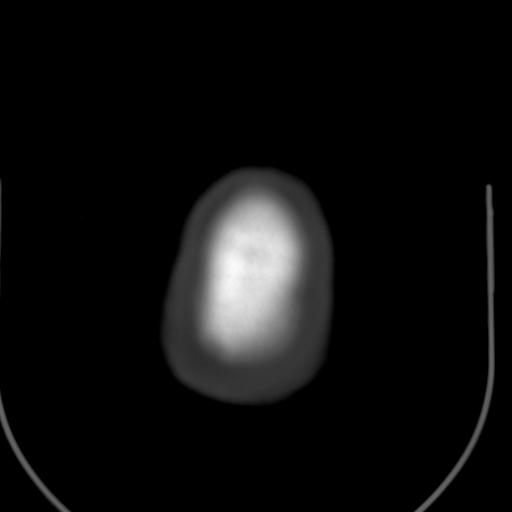

[Series 4: cor soft · coronal · 0.34mm/px · 3 of 57 slices shown]
[im 20/57  brain]
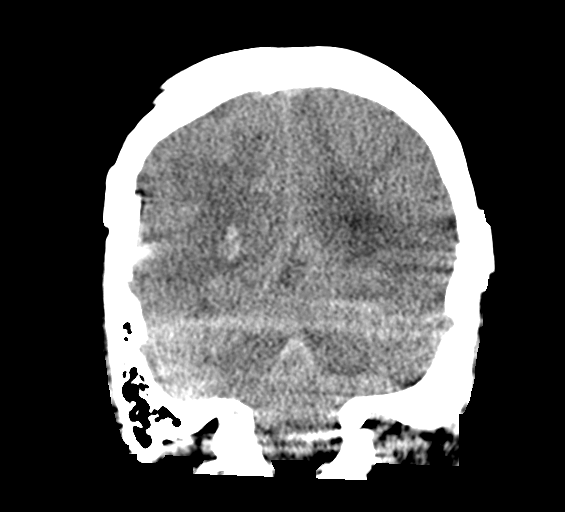
[im 26/57  brain]
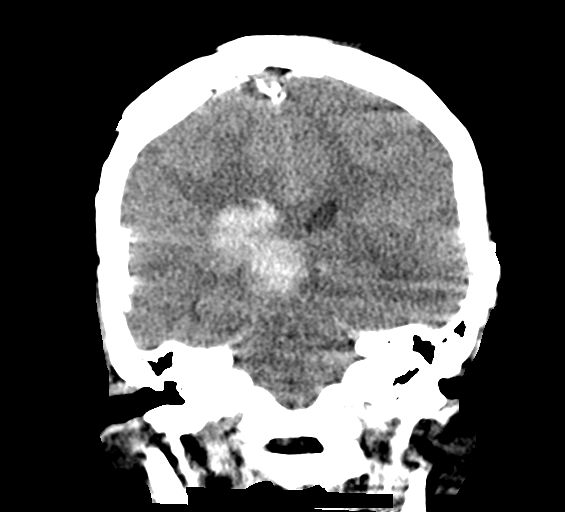
[im 31/57  brain]
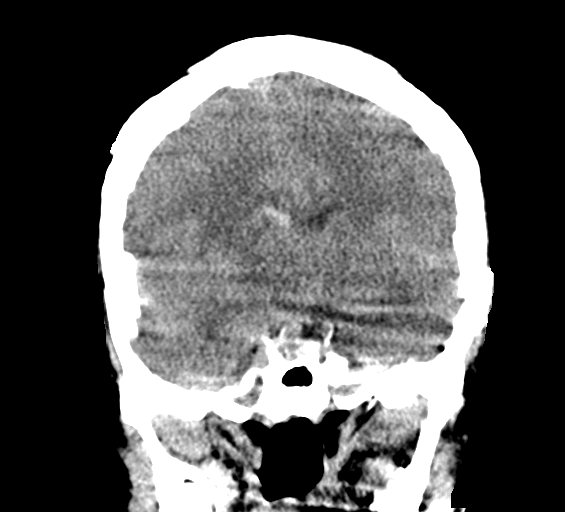

[Series 5: sag soft · sagittal · 0.34mm/px · 3 of 47 slices shown]
[im 16/47  brain]
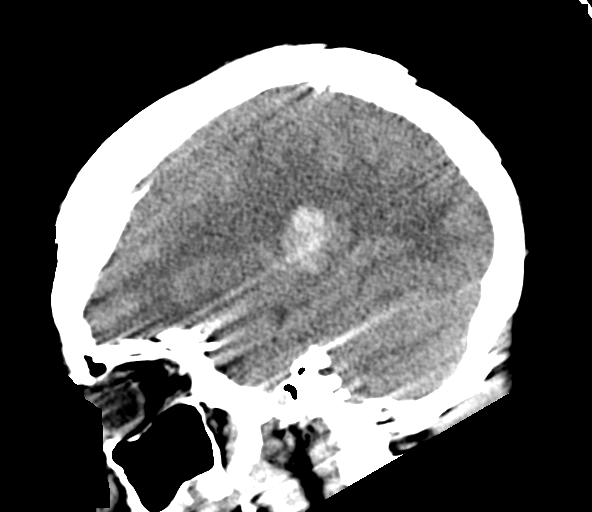
[im 24/47  brain]
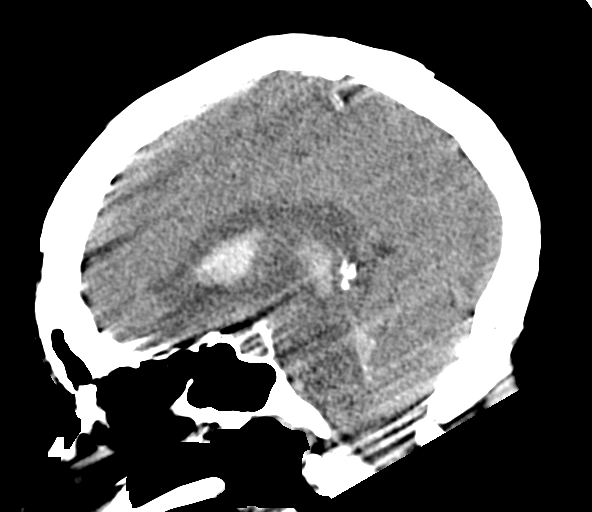
[im 31/47  brain]
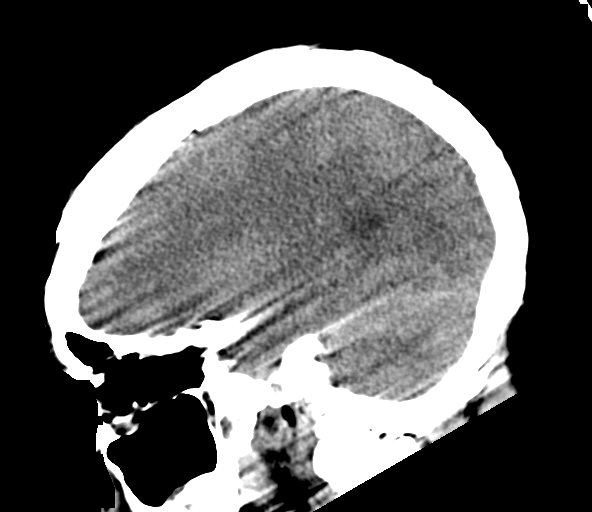

[15 of 47 positions shown; findings below may reference images not displayed]

FINDINGS: Brain: Acute hemorrhage with the epicenter in the right thalamus.
Measurement is approximately 3.2 x 2.1 x 2.5 cm (volume =
cm^3). Intraventricular penetration with blood filling the third
and fourth ventricles and nearly filling the right lateral
ventricle. No hydrocephalus. No extra-axial collection.

Vascular: No primary vascular finding otherwise.

Skull: Negative

Sinuses/Orbits: Clear/normal

Other: Extensive dental and periodontal disease

ASPECTS (Alberta Stroke Program Early CT Score)

- Ganglionic level infarction (caudate, lentiform nuclei, internal
capsule, insula, M1-M3 cortex): 7

- Supraganglionic infarction (M4-M6 cortex): 3

Total score (0-10 with 10 being normal): 10
IMPRESSION: 1. Acute hemorrhage with the epicenter in the right thalamus.
Measurement is approximately 3.2 x 2.1 x 2.5 cm (volume 8.8 cm^3).
Intraventricular penetration with blood filling the third and fourth
ventricles and nearly filling the right lateral ventricle. No
hydrocephalus.
2. These results were called by telephone at the time of
verbally acknowledged these results.

## 2020-08-26 MED ORDER — CLEVIDIPINE BUTYRATE 0.5 MG/ML IV EMUL
INTRAVENOUS | Status: AC
  Filled 2020-08-26: qty 50

## 2020-08-26 MED ORDER — LORAZEPAM 2 MG/ML IJ SOLN: 1.0000 mg | Freq: Once | INTRAMUSCULAR | Status: AC

## 2020-08-26 MED ORDER — IOHEXOL 350 MG/ML SOLN
100.0000 mL | Freq: Once | INTRAVENOUS | Status: AC | PRN
  Administered 2020-08-27: 100 mL via INTRAVENOUS

## 2020-08-26 MED ORDER — LORAZEPAM 2 MG/ML IJ SOLN
INTRAMUSCULAR | Status: AC
  Administered 2020-08-26: 1 mg via INTRAVENOUS
  Filled 2020-08-26: qty 1

## 2020-08-26 MED ORDER — LEVETIRACETAM IN NACL 1000 MG/100ML IV SOLN
1000.0000 mg | Freq: Once | INTRAVENOUS | Status: AC
  Administered 2020-08-26: 1000 mg via INTRAVENOUS
  Filled 2020-08-26: qty 100

## 2020-08-26 MED ORDER — CLEVIDIPINE BUTYRATE 0.5 MG/ML IV EMUL
0.0000 mg/h | INTRAVENOUS | Status: DC
  Administered 2020-08-26: 1 mg/h via INTRAVENOUS

## 2020-08-26 NOTE — ED Provider Notes (Signed)
[facility] EMERGENCY DEPARTMENT Provider Note   CSN: [id number] Arrival date & time: 08/26/20  2155     History Chief Complaint  Patient presents with  . Code Stroke    Charlene [name] is a 55 y.o. female hx of HTN, here presenting with left-sided weakness, headaches.  Patient was seen here about a week ago for headaches.  She was thought to have symptomatic hypertension was given her blood pressure medicine.  Patient states that her headache initially improved.  She states that she has sudden onset of right-sided headache and left face numbness and weakness about 15 minutes prior to arrival.  She states that the weakness became very progressive and now she is unable to move her left side.  Denies any trouble speaking.  Patient is not on blood thinners.  3  The history is provided by the patient.       Past Medical History:  Diagnosis Date  . Hypertension     Patient Active Problem List   Diagnosis Date Noted  . Anxiety state, unspecified 03/11/2014  . Essential hypertension 03/11/2014  . Essential hypertension, benign 02/28/2014  . Accelerated hypertension 02/21/2014  . Nausea & vomiting 02/21/2014  . Headache 02/21/2014  . Chest pain 02/21/2014    Past Surgical History:  Procedure Laterality Date  . CESAREAN SECTION       OB History    Gravida  9   Para  6   Term  6   Preterm      AB  2   Living  6     SAB  2   IAB      Ectopic      Multiple      Live Births  6           Family History  Problem Relation Age of Onset  . Hypertension Father   . Cancer Maternal Grandmother   . Thyroid disease Paternal Uncle     Social History   Tobacco Use  . Smoking status: Never Smoker  . Smokeless tobacco: Never Used  Substance Use Topics  . Alcohol use: No  . Drug use: No    Home Medications Prior to Admission medications   Medication Sig Start Date End Date Taking? Authorizing Provider  carvedilol (COREG) 6.25 MG tablet Take 1  tablet (6.25 mg total) by mouth 2 (two) times daily with a meal. 08/19/20   Benjiman Core, MD  hydrochlorothiazide (HYDRODIURIL) 25 MG tablet Take 1 tablet (25 mg total) by mouth daily. 08/19/20 10/18/20  Benjiman Core, MD  metoprolol tartrate (LOPRESSOR) 25 MG tablet Take 50 mg by mouth daily. 03/10/14 06/03/20  Doris Cheadle, MD    Allergies    Patient has no known allergies.  Review of Systems   Review of Systems  Neurological: Positive for weakness and numbness.  All other systems reviewed and are negative.   Physical Exam Updated Vital Signs BP (!) 198/168 (BP Location: Right Arm)   Pulse 89   Temp 98.1 F (36.7 C) (Oral)   Resp 13   Ht 5\' 5"  (1.651 m)   Wt 61.2 kg   LMP 03/03/2014   SpO2 100%   BMI 22.45 kg/m   Physical Exam Vitals and nursing note reviewed.  Constitutional:      Comments: Moderate distress  HENT:     Head: Normocephalic.     Nose: Nose normal.     Mouth/Throat:     Mouth: Mucous membranes are moist.  Eyes:  Extraocular Movements: Extraocular movements intact.     Pupils: Pupils are equal, round, and reactive to light.  Cardiovascular:     Rate and Rhythm: Normal rate and regular rhythm.     Pulses: Normal pulses.     Heart sounds: Normal heart sounds.  Pulmonary:     Effort: Pulmonary effort is normal.     Breath sounds: Normal breath sounds.  Abdominal:     General: Abdomen is flat.     Palpations: Abdomen is soft.  Musculoskeletal:        General: Normal range of motion.     Cervical back: Normal range of motion and neck supple.  Skin:    General: Skin is warm.     Capillary Refill: Capillary refill takes less than 2 seconds.  Neurological:     Comments: No obvious facial droop.  Patient does have flaccid paralysis of the left arm.  Patient dropped her left arm on her face.  patient has 3 out of 5 left leg strength.  Patient's strength is 5 out of 5 of the right arm and leg.  Patient also has left-sided neglect as well.  Unable  to respond to pain in the left arm but able to respond to pain in the left leg.     ED Results / Procedures / Treatments   Labs (all labs ordered are listed, but only abnormal results are displayed) Labs Reviewed  CBC - Abnormal; Notable for the following components:      Result Value   Hemoglobin 11.7 (*)    MCV 76.6 (*)    MCH 24.0 (*)    All other components within normal limits  DIFFERENTIAL - Abnormal; Notable for the following components:   Lymphs Abs 4.2 (*)    All other components within normal limits  COMPREHENSIVE METABOLIC PANEL - Abnormal; Notable for the following components:   Potassium 3.2 (*)    Glucose, Bld 131 (*)    BUN 29 (*)    Creatinine, Ser 1.09 (*)    All other components within normal limits  CBG MONITORING, ED - Abnormal; Notable for the following components:   Glucose-Capillary 126 (*)    All other components within normal limits  RESP PANEL BY RT-PCR (FLU A&B, COVID) ARPGX2  PROTIME-INR  APTT  ETHANOL  RAPID URINE DRUG SCREEN, HOSP PERFORMED  URINALYSIS, ROUTINE W REFLEX MICROSCOPIC  PREGNANCY, URINE  TROPONIN I (HIGH SENSITIVITY)    EKG EKG Interpretation  Date/Time:  Wednesday August 26 2020 22:35:39 EST Ventricular Rate:  75 PR Interval:    QRS Duration: 106 QT Interval:  417 QTC Calculation: 466 R Axis:   57 Text Interpretation: Sinus rhythm Abnormal R-wave progression, early transition Left ventricular hypertrophy Nonspecific T abnormalities, lateral leads ST elevation, consider anterior injury No significant change since last tracing Confirmed by Richardean Canal [phone]) on 08/26/2020 10:40:24 PM   Radiology CT HEAD CODE STROKE WO CONTRAST  Result Date: 08/26/2020 CLINICAL DATA:  Code stroke.  Left arm and leg numbness. EXAM: CT HEAD WITHOUT CONTRAST TECHNIQUE: Contiguous axial images were obtained from the base of the skull through the vertex without intravenous contrast. COMPARISON:  05/05/2020 FINDINGS: Brain: Acute hemorrhage with  the epicenter in the right thalamus. Measurement is approximately 3.2 x 2.1 x 2.5 cm (volume = 8.8 cm^3). Intraventricular penetration with blood filling the third and fourth ventricles and nearly filling the right lateral ventricle. No hydrocephalus. No extra-axial collection. Vascular: No primary vascular finding otherwise. Skull: Negative Sinuses/Orbits: Clear/normal  Other: Extensive dental and periodontal disease ASPECTS (Alberta Stroke Program Early CT Score) - Ganglionic level infarction (caudate, lentiform nuclei, internal capsule, insula, M1-M3 cortex): 7 - Supraganglionic infarction (M4-M6 cortex): 3 Total score (0-10 with 10 being normal): 10 IMPRESSION: 1. Acute hemorrhage with the epicenter in the right thalamus. Measurement is approximately 3.2 x 2.1 x 2.5 cm (volume 8.8 cm^3). Intraventricular penetration with blood filling the third and fourth ventricles and nearly filling the right lateral ventricle. No hydrocephalus. 2. These results were called by telephone at the time of interpretation on 08/26/2020 at 10:35 pm to provider Samayah Novinger , who verbally acknowledged these results. Electronically Signed   By: Paulina Fusi M.D.   On: 08/26/2020 22:36    Procedures Procedures   CRITICAL CARE Performed by: Richardean Canal   Total critical care time: 40 minutes  Critical care time was exclusive of separately billable procedures and treating other patients.  Critical care was necessary to treat or prevent imminent or life-threatening deterioration.  Critical care was time spent personally by me on the following activities: development of treatment plan with patient and/or surrogate as well as nursing, discussions with consultants, evaluation of patient's response to treatment, examination of patient, obtaining history from patient or surrogate, ordering and performing treatments and interventions, ordering and review of laboratory studies, ordering and review of radiographic studies, pulse oximetry  and re-evaluation of patient's condition.   Medications Ordered in ED Medications  iohexol (OMNIPAQUE) 350 MG/ML injection 100 mL ( Intravenous Canceled Entry 08/26/20 2212)  LORazepam (ATIVAN) injection 1 mg (has no administration in time range)  clevidipine (CLEVIPREX) infusion 0.5 mg/mL (1 mg/hr Intravenous New Bag/Given 08/26/20 2229)  LORazepam (ATIVAN) 2 MG/ML injection (has no administration in time range)  clevidipine (CLEVIPREX) 0.5 MG/ML infusion (has no administration in time range)    ED Course  I have reviewed the triage vital signs and the nursing notes.  Pertinent labs & imaging results that were available during my care of the patient were reviewed by me and considered in my medical decision making (see chart for details).    MDM Rules/Calculators/A&P                         Charlene [name] is a 55 y.o. female here presenting with left-sided neglect and flaccid paralysis of the left arm.  Patient is hypertensive in the 170s.  Code stroke was activated due to significant deficit.  Patient has intraparenchymal bleeding.  I discussed case with teleneurology as well as our neurologist, Dr. Jerrell Belfast. They both reviewed the scans.  Patient is started on Cleviprex drip to get blood pressure below 140.  Will emergently transfer to [facility] for neurology to admit for neuro ICU.  Dr. Bernette Mayers in the ER at [facility] accepted transfer     Final Clinical Impression(s) / ED Diagnoses Final diagnoses:  None    Rx / DC Orders ED Discharge Orders    None       Charlynne Pander, MD 08/26/20 2240

## 2020-08-26 NOTE — ED Triage Notes (Signed)
Pt c/o left side weakness x 20 min-pt lifted from car to w/c by husband-taken to tx room via w/c-lifted from w/c to stretcher with EDP Yao at [facility]

## 2020-08-26 NOTE — ED Provider Notes (Signed)
  Provider Note MRN:  [id number]  Arrival date & time: 08/26/20    ED Course and Medical Decision Making  Assumed care from Dr. Chevis Pretty on patient transfer.  Code stroke at [facility], left-sided deficits, has a thalamic bleed.  Hypertensive on Cleviprex, to be admitted to neuro ICU.  Patient is altered, somnolent.  Protecting airway.  Neurosurgery consulted as well, patient brought up to the neuro ICU for further care.  .Critical Care Performed by: Sabas Sous, MD Authorized by: Sabas Sous, MD   Critical care provider statement:    Critical care time (minutes):  33   Critical care was necessary to treat or prevent imminent or life-threatening deterioration of the following conditions:  CNS failure or compromise   Critical care was time spent personally by me on the following activities:  Discussions with consultants, evaluation of patient's response to treatment, examination of patient, ordering and performing treatments and interventions, ordering and review of laboratory studies, ordering and review of radiographic studies, pulse oximetry, re-evaluation of patient's condition, obtaining history from patient or surrogate and review of old charts   I assumed direction of critical care for this patient from another provider in my specialty: yes      Final Clinical Impressions(s) / ED Diagnoses     ICD-10-CM   1. Intraparenchymal hemorrhage of brain [facility])  I61.9     ED Discharge Orders    None      Discharge Instructions   None     Elmer Sow. Pilar Plate, MD [facility] [email or web address]    Sabas Sous, MD 08/27/20 [phone]

## 2020-08-26 NOTE — ED Notes (Signed)
Report given to [place], Charity fundraiser, Press photographer at [facility] ED

## 2020-08-26 NOTE — ED Notes (Signed)
Report given to [facility] with [employer]. All questions answered.

## 2020-08-26 NOTE — ED Notes (Signed)
Report given to [facility]. 

## 2020-08-26 NOTE — Consult Note (Signed)
[facility] Tele[facility]urology Consult Services  Date of Service:   08/26/2020 22:14:19  Diagnosis:     .  I61.5 - Intracerebral hemorrhage, intraventricular  Impression: 55 yo F with h/o uncontrolled HTN who presents with headache progressing to L hemiplegia and AMS, dysarthria. 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  Metrics: Last Known Well: 08/26/2020 20:00:00 [facility] Notification Time: 08/26/2020 22:14:19 Arrival Time: 08/26/2020 21:55:00 Stamp Time: 08/26/2020 22:14:19 Initial Response Time: 08/26/2020 22:16:19 Symptoms: L hemiplegia. NIHSS Start Assessment Time: 08/26/2020 22:20:30 Patient is not a candidate for Thrombolytic. Thrombolytic Medical Decision: 08/26/2020 22:38:09 Patient was not deemed candidate for Thrombolytic because of following reasons: Current or Previous ICH.  CT head was reviewed and results were: motion limited study shows R basal ganglia and intraventricular hemorrhage  ED Physician notified of diagnostic impression and management plan on 08/26/2020 22:45:07   Process delays noted by physician:  . Activation Delay:     Not Applicable  . Thrombolytic Decision Delay:     Awaiting on history by family on potential contraindications     Additional information became available which did not exist upon initial assessment  Recommendation:  Diagnostic Studies:     . Repeat CT head in first 8-12hrs     . CTA head and neck with contrast  Laboratory Studies:     .  INR/PT     .  aPTT?     .  CBC  Medications:     .  Hold?antiplatelet?therapy/NSAIDS/Anticoagulation     .  Load with Keppra 1gm now.     .  Keppra 500mg  bid.  Nursing Recommendations:     .  Telemetry, IV Fluids?Avoid dextrose containing fluids, Maintain euglycemia     .  Head of bed 30 degrees     .  Neuro checks q1-2?hrs?during ICU stay     .  Once stable neuro checks q4?hrs     .  BP control, lower SBP to less than  160 with goal of 140's  Consultations:     .  Need Neurosurgery consultation?STAT     .  Recommend Speech therapy if failed dysphagia screen     .  Physical therapy/Occupational therapy  DVT Prophylaxis:     .  SCDs  Disposition:     .  Neurology will Follow  ------------------------------------------------------------------------------  History of Present Illness: Patient is a 55 year old Female.  Patient was brought by EMS for symptoms of L hemiplegia.  she was at her mother's house at 0800, sometime then started having L facial numbness. Then by the time she went home, became flaccid on the L side and slurred speech. She's been having headaches for a couple weeks. husband got a text that she was not feeling well at 9:36. she did not go to work today because she had a headache. she drove the car and was doing well, was at her daughter's house and then her mother's house.  Past Medical History:     . Hypertension  Anticoagulant use:  No  Antiplatelet use: No  NIHSS may not be reliable due to: exam limited as patient needed stabilization  Examination: BP(178/104), Pulse(104), Blood Glucose(126) 1A: Level of Consciousness - Arouses to minor stimulation + 1 1B: Ask Month and Age - Both Questions Right + 0 1C: Blink Eyes & Squeeze Hands - Performs Both Tasks + 0 2: Test Horizontal Extraocular Movements - Normal + 0 3: Test Visual Fields - No Visual Loss + 0 4:  Test Facial Palsy (Use Grimace if Obtunded) - Minor paralysis (flat nasolabial fold, smile asymmetry) + 1 5A: Test Left Arm Motor Drift - No Movement + 4 5B: Test Right Arm Motor Drift - No Drift for 10 Seconds + 0 6A: Test Left Leg Motor Drift - No Movement + 4 6B: Test Right Leg Motor Drift - No Drift for 5 Seconds + 0 7: Test Limb Ataxia (FNF/Heel-Shin) - No Ataxia + 0 8: Test Sensation - Mild-Moderate Loss: Less Sharp/More Dull + 1 9: Test Language/Aphasia - Normal; No aphasia + 0 10: Test Dysarthria -  Mild-Moderate Dysarthria: Slurring but can be understood + 1 11: Test Extinction/Inattention - Visual/tactile/auditory/spatial/personal inattention + 1 NIHSS Score: 13  ICH Score: 2  GlasGow Coma Score: 13-15 (0)  Age >= 80: No (0)  ICH volume >= 28mL: Yes (+1)  Intraventricular hemorrhage: Yes (+1)  Infratentorial origin of hemorrhage: No (0)  Pre-Morbid Modified Rankin Scale: 1 Points = No significant disability despite symptoms; able to carry out all usual duties and activities   Patient/Family was informed the Neurology Consult would occur via TeleHealth consult by way of interactive audio and video telecommunications and consented to receiving care in this manner.   Dr Lynnda Child  [facility] [phone] Case [id number]

## 2020-08-26 NOTE — ED Notes (Signed)
Difficulty in obtaining BP due to patient agitation. [facility] will be adjusted accordingly when able to obtain accurate BP measurement.

## 2020-08-27 ENCOUNTER — Inpatient Hospital Stay (HOSPITAL_COMMUNITY): Payer: Medicaid Other

## 2020-08-27 DIAGNOSIS — J690 Pneumonitis due to inhalation of food and vomit: Secondary | ICD-10-CM | POA: Diagnosis not present

## 2020-08-27 DIAGNOSIS — G911 Obstructive hydrocephalus: Secondary | ICD-10-CM

## 2020-08-27 DIAGNOSIS — D509 Iron deficiency anemia, unspecified: Secondary | ICD-10-CM | POA: Diagnosis present

## 2020-08-27 DIAGNOSIS — Z20822 Contact with and (suspected) exposure to covid-19: Secondary | ICD-10-CM | POA: Diagnosis present

## 2020-08-27 DIAGNOSIS — G9341 Metabolic encephalopathy: Secondary | ICD-10-CM | POA: Diagnosis not present

## 2020-08-27 DIAGNOSIS — I161 Hypertensive emergency: Secondary | ICD-10-CM | POA: Diagnosis present

## 2020-08-27 DIAGNOSIS — E46 Unspecified protein-calorie malnutrition: Secondary | ICD-10-CM | POA: Diagnosis present

## 2020-08-27 DIAGNOSIS — J9601 Acute respiratory failure with hypoxia: Secondary | ICD-10-CM | POA: Diagnosis not present

## 2020-08-27 DIAGNOSIS — G935 Compression of brain: Secondary | ICD-10-CM | POA: Diagnosis present

## 2020-08-27 DIAGNOSIS — N179 Acute kidney failure, unspecified: Secondary | ICD-10-CM | POA: Diagnosis present

## 2020-08-27 DIAGNOSIS — G936 Cerebral edema: Secondary | ICD-10-CM | POA: Diagnosis present

## 2020-08-27 DIAGNOSIS — I610 Nontraumatic intracerebral hemorrhage in hemisphere, subcortical: Secondary | ICD-10-CM

## 2020-08-27 DIAGNOSIS — N136 Pyonephrosis: Secondary | ICD-10-CM | POA: Diagnosis not present

## 2020-08-27 DIAGNOSIS — I82611 Acute embolism and thrombosis of superficial veins of right upper extremity: Secondary | ICD-10-CM | POA: Diagnosis not present

## 2020-08-27 DIAGNOSIS — E7800 Pure hypercholesterolemia, unspecified: Secondary | ICD-10-CM

## 2020-08-27 DIAGNOSIS — H05013 Cellulitis of bilateral orbits: Secondary | ICD-10-CM | POA: Diagnosis not present

## 2020-08-27 DIAGNOSIS — D72819 Decreased white blood cell count, unspecified: Secondary | ICD-10-CM | POA: Diagnosis not present

## 2020-08-27 DIAGNOSIS — I615 Nontraumatic intracerebral hemorrhage, intraventricular: Secondary | ICD-10-CM | POA: Diagnosis present

## 2020-08-27 DIAGNOSIS — I6329 Cerebral infarction due to unspecified occlusion or stenosis of other precerebral arteries: Secondary | ICD-10-CM | POA: Diagnosis not present

## 2020-08-27 DIAGNOSIS — I619 Nontraumatic intracerebral hemorrhage, unspecified: Secondary | ICD-10-CM | POA: Diagnosis present

## 2020-08-27 DIAGNOSIS — Z978 Presence of other specified devices: Secondary | ICD-10-CM

## 2020-08-27 DIAGNOSIS — G8194 Hemiplegia, unspecified affecting left nondominant side: Secondary | ICD-10-CM | POA: Diagnosis present

## 2020-08-27 DIAGNOSIS — Y95 Nosocomial condition: Secondary | ICD-10-CM | POA: Diagnosis not present

## 2020-08-27 DIAGNOSIS — I6389 Other cerebral infarction: Secondary | ICD-10-CM

## 2020-08-27 DIAGNOSIS — A4159 Other Gram-negative sepsis: Secondary | ICD-10-CM | POA: Diagnosis not present

## 2020-08-27 DIAGNOSIS — J150 Pneumonia due to Klebsiella pneumoniae: Secondary | ICD-10-CM | POA: Diagnosis not present

## 2020-08-27 DIAGNOSIS — E870 Hyperosmolality and hypernatremia: Secondary | ICD-10-CM | POA: Diagnosis not present

## 2020-08-27 DIAGNOSIS — E1165 Type 2 diabetes mellitus with hyperglycemia: Secondary | ICD-10-CM | POA: Diagnosis present

## 2020-08-27 DIAGNOSIS — Z8679 Personal history of other diseases of the circulatory system: Secondary | ICD-10-CM | POA: Diagnosis present

## 2020-08-27 DIAGNOSIS — I618 Other nontraumatic intracerebral hemorrhage: Secondary | ICD-10-CM | POA: Diagnosis present

## 2020-08-27 DIAGNOSIS — J156 Pneumonia due to other aerobic Gram-negative bacteria: Secondary | ICD-10-CM | POA: Diagnosis not present

## 2020-08-27 LAB — GLUCOSE, CAPILLARY
Glucose-Capillary: 102 mg/dL — ABNORMAL HIGH (ref 70–99)
Glucose-Capillary: 109 mg/dL — ABNORMAL HIGH (ref 70–99)
Glucose-Capillary: 185 mg/dL — ABNORMAL HIGH (ref 70–99)

## 2020-08-27 LAB — RAPID URINE DRUG SCREEN, HOSP PERFORMED
Amphetamines: NOT DETECTED
Barbiturates: NOT DETECTED
Benzodiazepines: NOT DETECTED
Cocaine: NOT DETECTED
Opiates: POSITIVE — AB
Tetrahydrocannabinol: NOT DETECTED

## 2020-08-27 LAB — POCT I-STAT 7, (LYTES, BLD GAS, ICA,H+H)
Acid-Base Excess: 9 mmol/L — ABNORMAL HIGH (ref 0.0–2.0)
Bicarbonate: 31.7 mmol/L — ABNORMAL HIGH (ref 20.0–28.0)
Calcium, Ion: 1.1 mmol/L — ABNORMAL LOW (ref 1.15–1.40)
HCT: 32 % — ABNORMAL LOW (ref 36.0–46.0)
Hemoglobin: 10.9 g/dL — ABNORMAL LOW (ref 12.0–15.0)
O2 Saturation: 100 %
Patient temperature: 98.6
Potassium: 2.5 mmol/L — CL (ref 3.5–5.1)
Sodium: 140 mmol/L (ref 135–145)
TCO2: 33 mmol/L — ABNORMAL HIGH (ref 22–32)
pCO2 arterial: 35.5 mmHg (ref 32.0–48.0)
pH, Arterial: 7.558 — ABNORMAL HIGH (ref 7.350–7.450)
pO2, Arterial: 611 mmHg — ABNORMAL HIGH (ref 83.0–108.0)

## 2020-08-27 LAB — MRSA PCR SCREENING: MRSA by PCR: NEGATIVE

## 2020-08-27 LAB — LIPID PANEL
Cholesterol: 265 mg/dL — ABNORMAL HIGH (ref 0–200)
HDL: 72 mg/dL (ref >40)
LDL Cholesterol: 167 mg/dL — ABNORMAL HIGH (ref 0–99)
Total CHOL/HDL Ratio: 3.7 RATIO
Triglycerides: 129 mg/dL (ref <150)
VLDL: 26 mg/dL (ref 0–40)

## 2020-08-27 LAB — HEMOGLOBIN A1C
Hgb A1c MFr Bld: 6 % — ABNORMAL HIGH (ref 4.8–5.6)
Mean Plasma Glucose: 125.5 mg/dL

## 2020-08-27 LAB — ECHOCARDIOGRAM COMPLETE
Area-P 1/2: 4.39 cm2
Calc EF: 59.6 %
Height: 65 in
S' Lateral: 2.9 cm
Single Plane A2C EF: 64 %
Single Plane A4C EF: 53.3 %
Weight: 2158.74 oz

## 2020-08-27 LAB — HIV ANTIBODY (ROUTINE TESTING W REFLEX): HIV Screen 4th Generation wRfx: NONREACTIVE

## 2020-08-27 LAB — SODIUM: Sodium: 137 mmol/L (ref 135–145)

## 2020-08-27 IMAGING — DX DG CHEST 1V PORT
1 series · 1 of 1 positions shown · non-contrast
Comparison: Chest radiograph dated [DATE].

CLINICAL DATA: 54-year-old female status post intubation.

EXAM:
PORTABLE CHEST 1 VIEW

[chest]
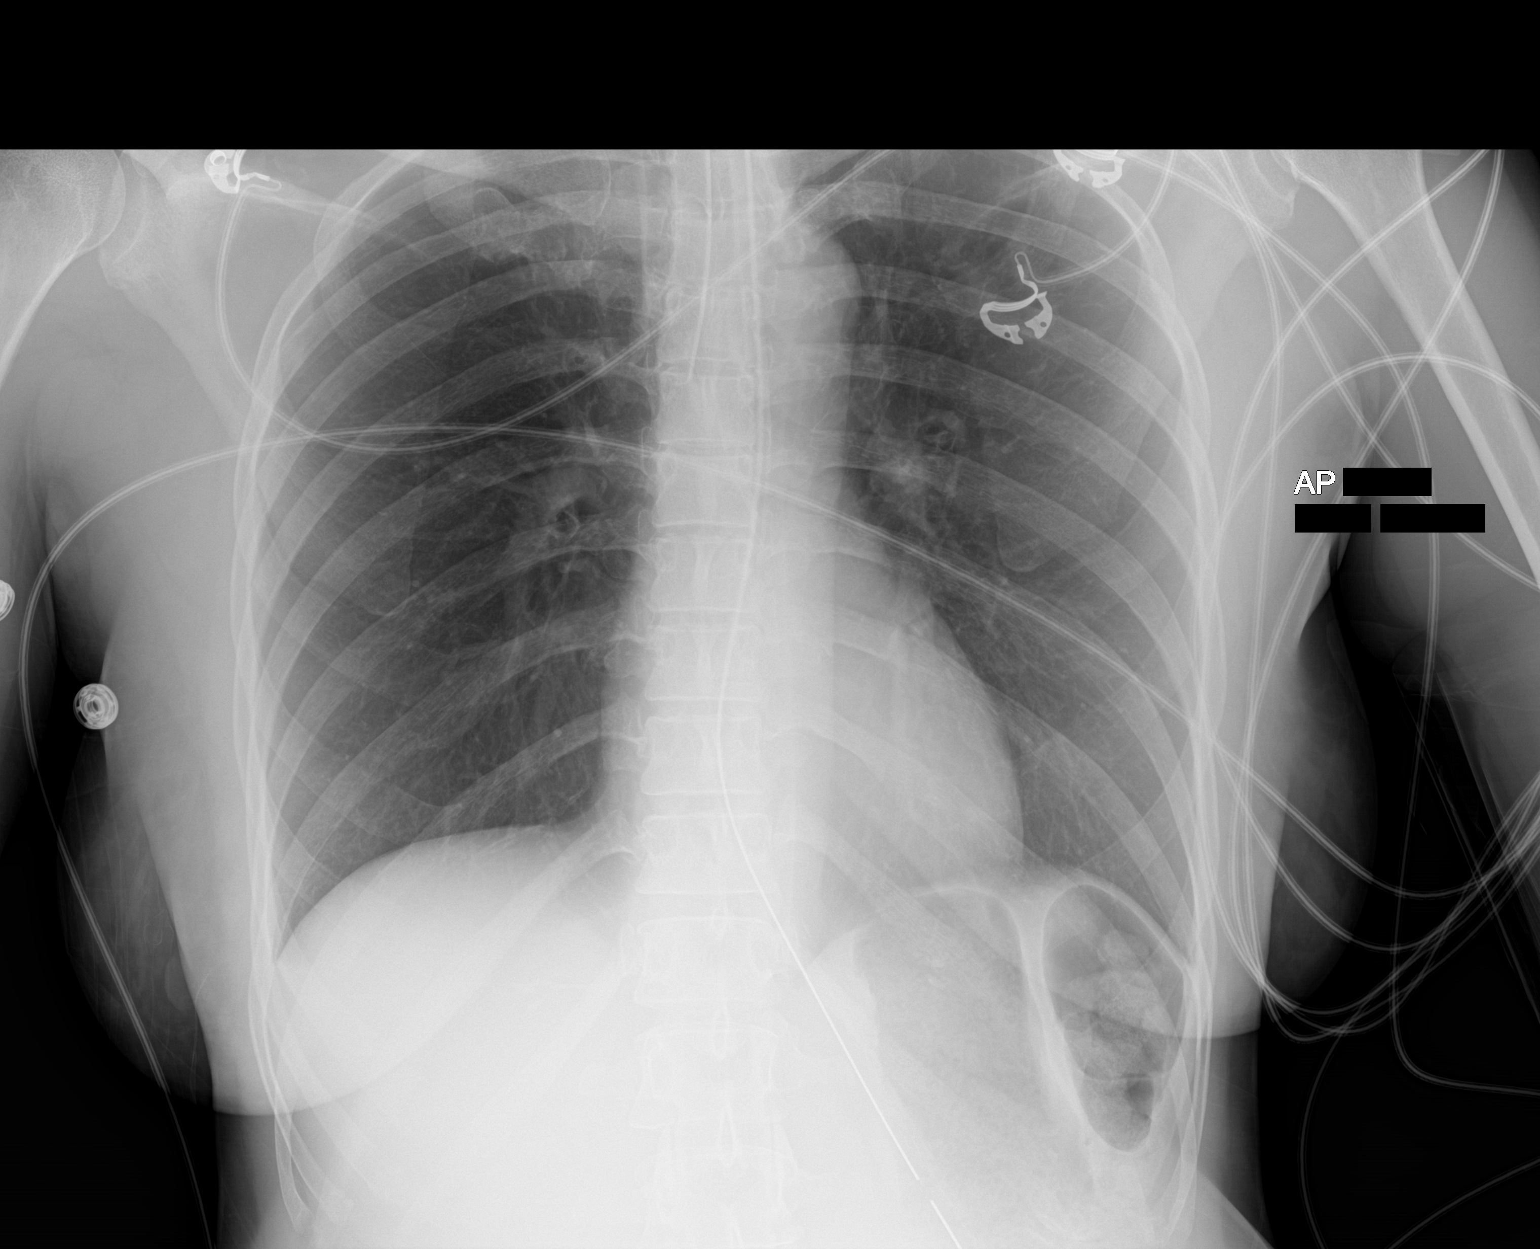

[1 of 1 positions shown; findings below may reference images not displayed]

FINDINGS: Endotracheal tube with tip at the level of the carina entering into
the right mainstem bronchus. Recommend retraction by approximately
4-5 cm. Enteric tube extends below the diaphragm with tip beyond the
inferior margin of the image and side-port in the proximal stomach.

The lungs are clear. There is no pleural effusion pneumothorax. The
cardiac silhouette is within limits. No acute osseous pathology.
IMPRESSION: Endotracheal tube in the right mainstem bronchus. Recommend
retraction by approximately 4-5 cm.

These results were called by telephone at the time of interpretation
on [DATE] at [DATE] to provider [email or web address], who verbally
acknowledged these results.

## 2020-08-27 IMAGING — CT CT HEAD W/O CM
1 series · 16 of 30 positions shown, 20 images · non-contrast
Comparison: CT from earlier in the same day.

CLINICAL DATA: Follow-up hemorrhage

EXAM:
CT HEAD WITHOUT CONTRAST
TECHNIQUE: Contiguous axial images were obtained from the base of the skull
through the vertex without intravenous contrast.

[Series 6: head 5.0 h30s · axial · 0.39mm/px · z∈[-107,+48]mm · 16 of 35 slices shown, 20 images]
[im 2/35  brain]
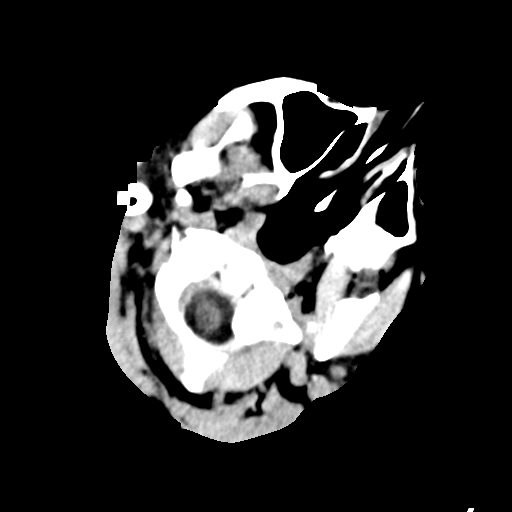
[im 2/35  bone]
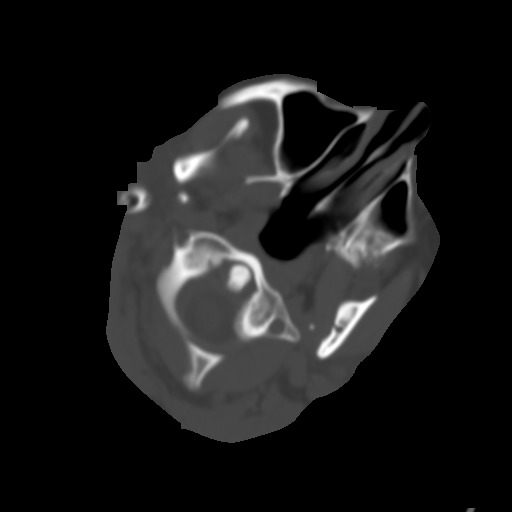
[im 4/35  brain]
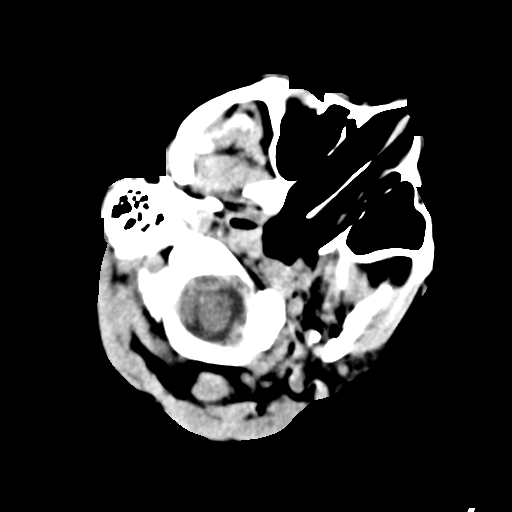
[im 6/35  brain]
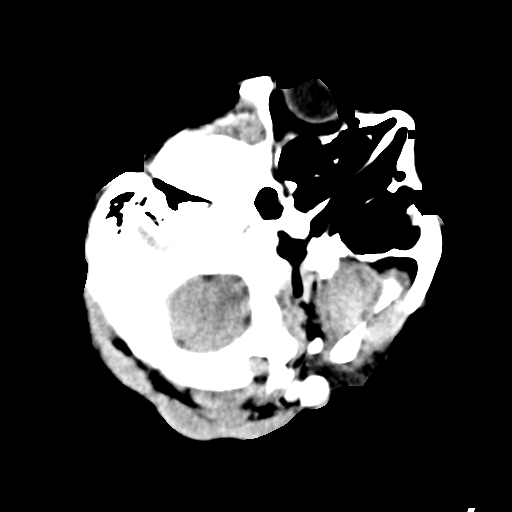
[im 9/35  brain]
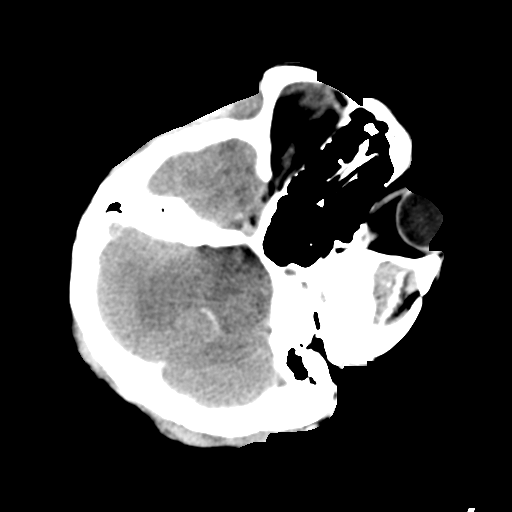
[im 10/35  brain]
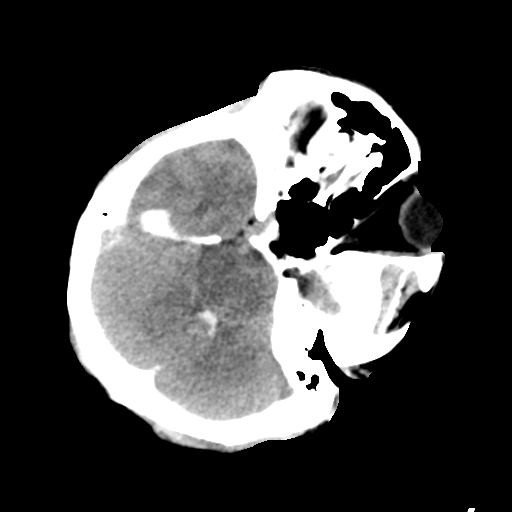
[im 10/35  bone]
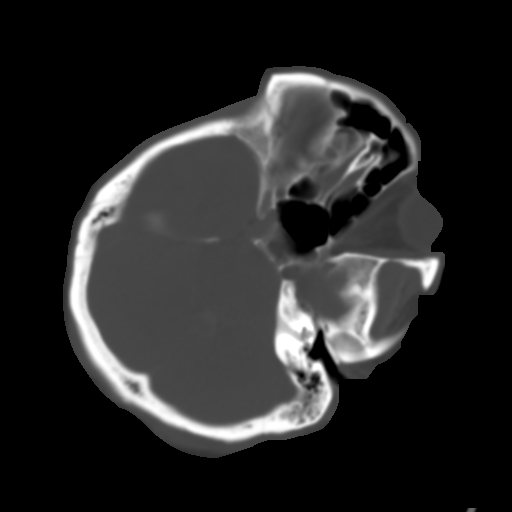
[im 12/35  brain]
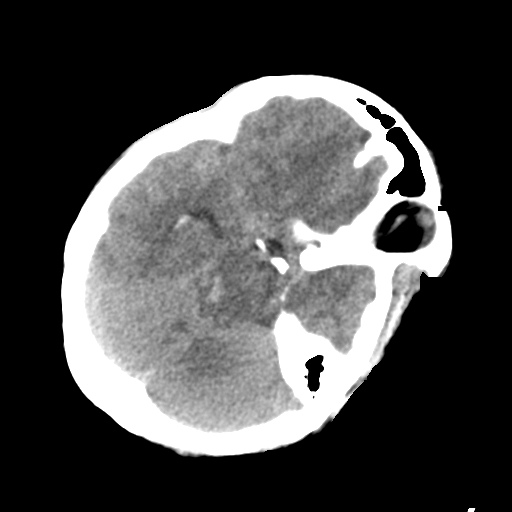
[im 15/35  brain]
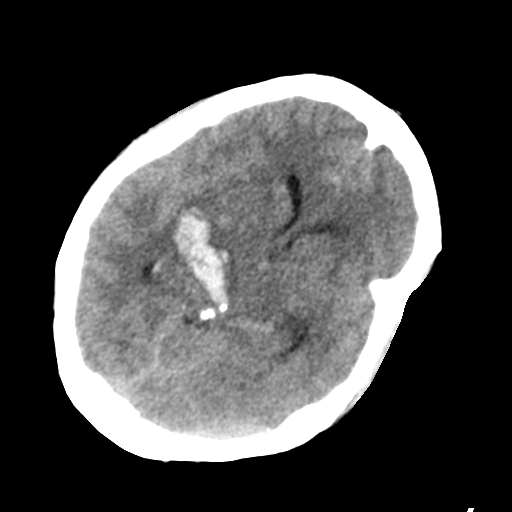
[im 17/35  brain]
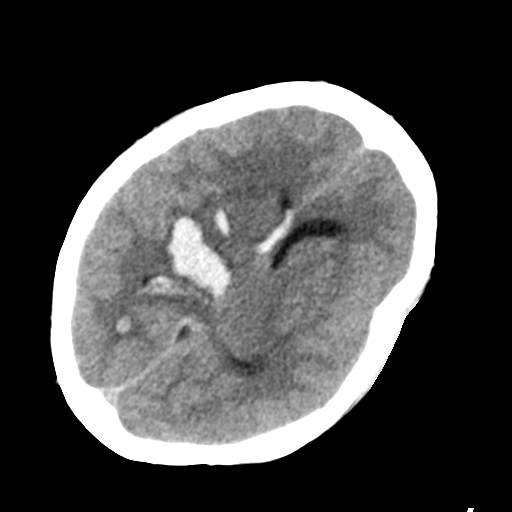
[im 18/35  brain]
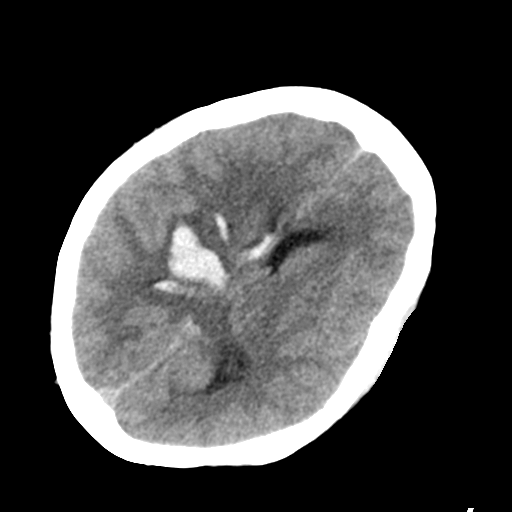
[im 18/35  bone]
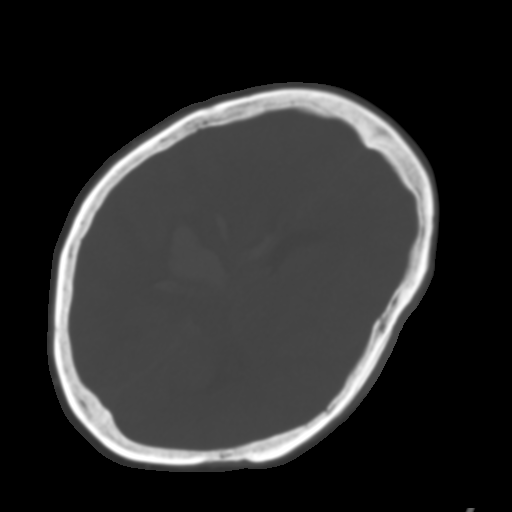
[im 20/35  brain]
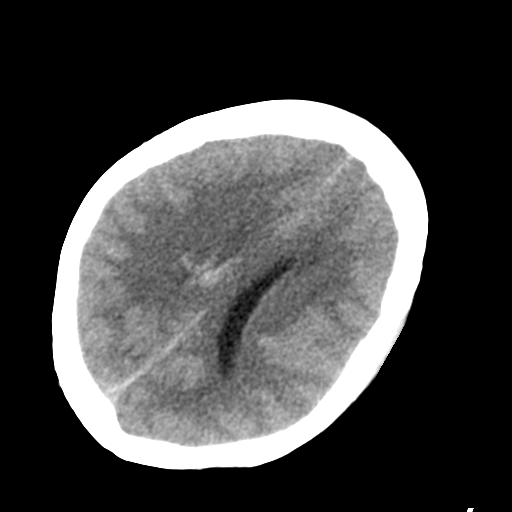
[im 23/35  brain]
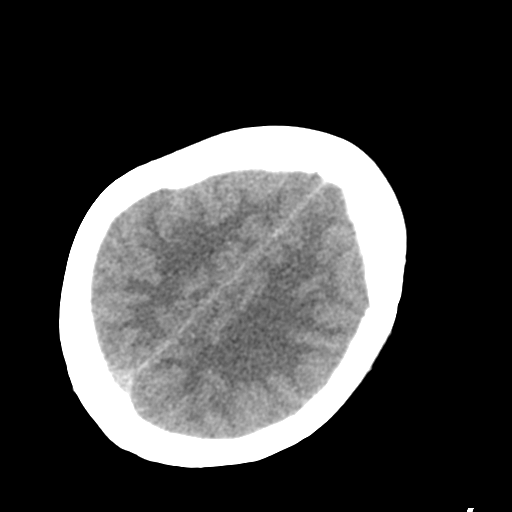
[im 25/35  brain]
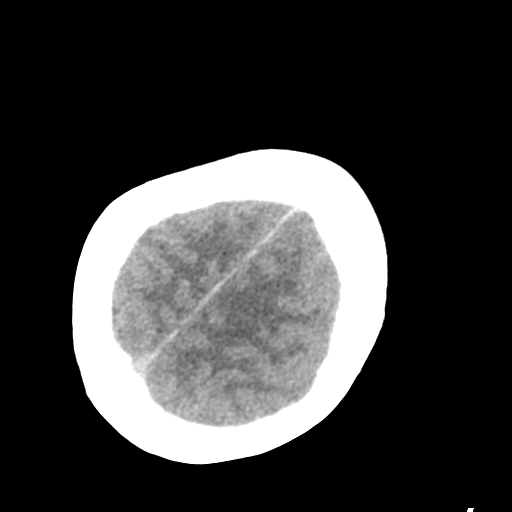
[im 26/35  brain]
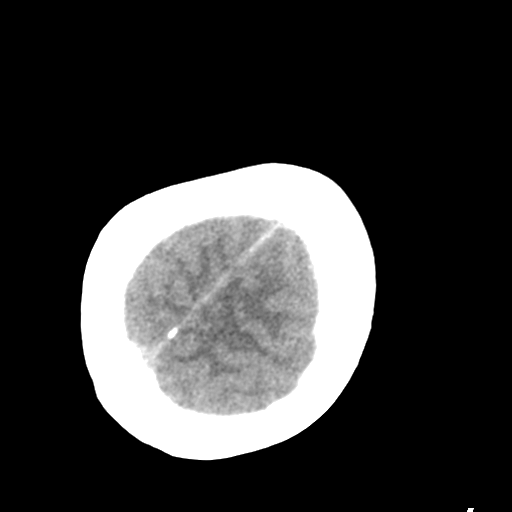
[im 26/35  bone]
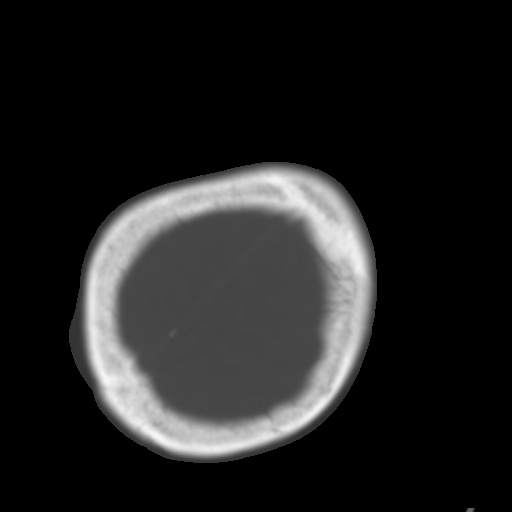
[im 29/35  brain]
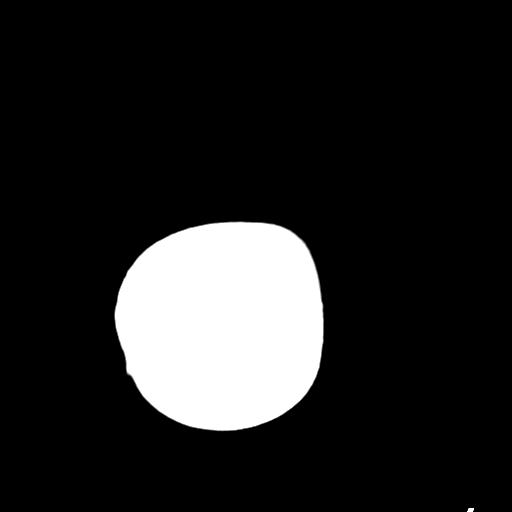
[im 31/35  brain]
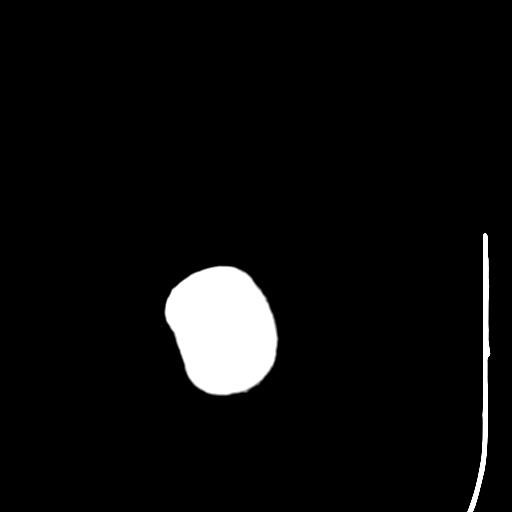
[im 33/35  brain]
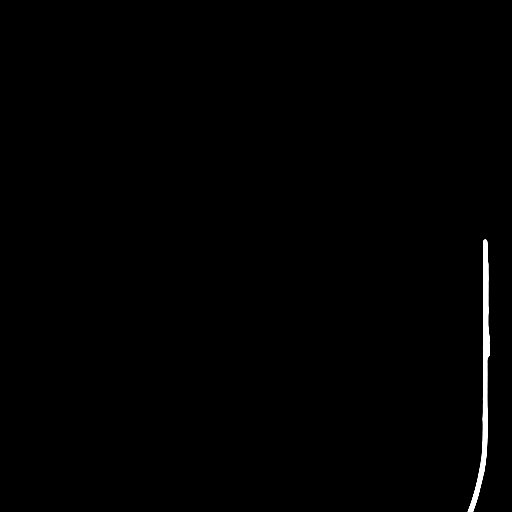

[16 of 30 positions shown; findings below may reference images not displayed]

FINDINGS: Brain: There is again noted a large area of parenchymal hemorrhage
in the right thalamus and basal ganglia with extension into the
right lateral ventricle. Blood is also noted within the fourth
ventricle as well. No significant ventricular dilatation is noted.
No findings to suggest acute infarct are noted.

Vascular: No hyperdense vessel or unexpected calcification.

Skull: Normal. Negative for fracture or focal lesion.

Sinuses/Orbits: No acute finding.

Other: None.
IMPRESSION: Stable parenchymal hemorrhage in the right thalamus and basal
ganglia with intraventricular extension as described. No new focal
abnormality is noted.

## 2020-08-27 IMAGING — CT CT HEAD W/O CM
3 series · 14 of 47 positions shown, 16 images · non-contrast
Comparison: CT imaging [DATE], [DATE]

CLINICAL DATA: Known cerebral hemorrhage, mental status change

EXAM:
CT HEAD WITHOUT CONTRAST
TECHNIQUE: Contiguous axial images were obtained from the base of the skull
through the vertex without intravenous contrast.

[Series 3: head 5.0 h30s · axial · 0.40mm/px · z∈[-153,-23]mm · 8 of 32 slices shown, 10 images]
[im 3/32  brain]
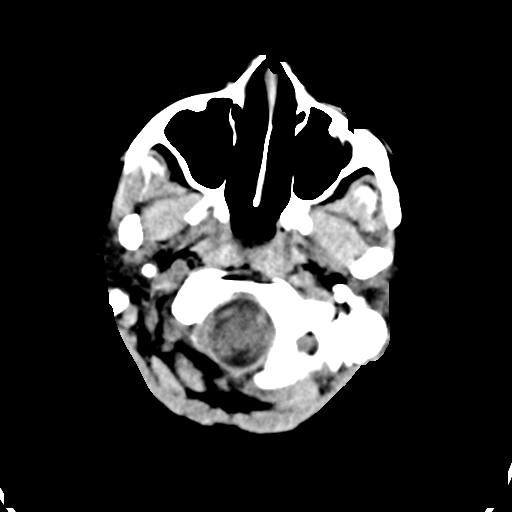
[im 3/32  bone]
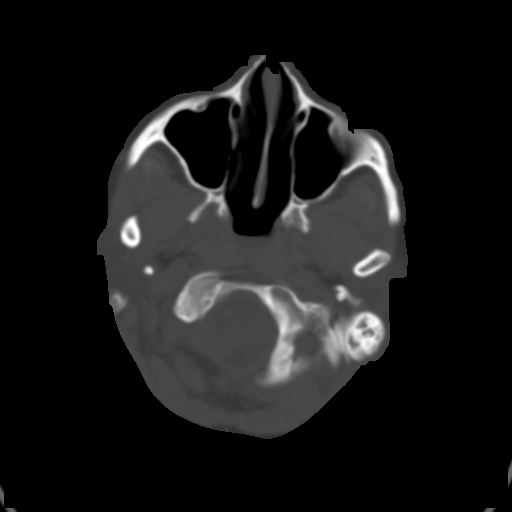
[im 7/32  brain]
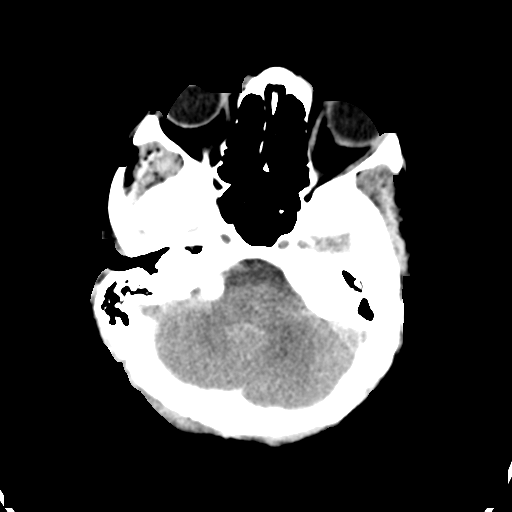
[im 10/32  brain]
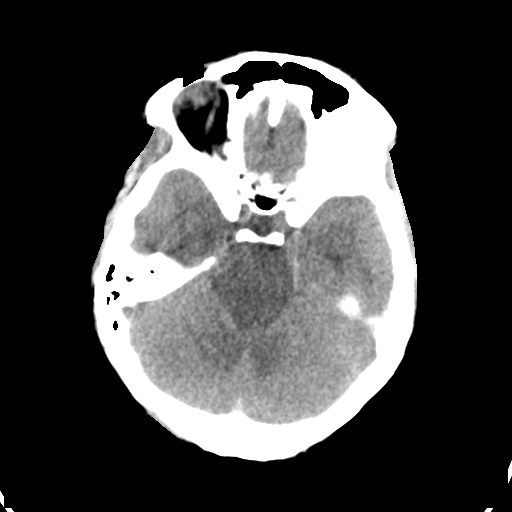
[im 14/32  brain]
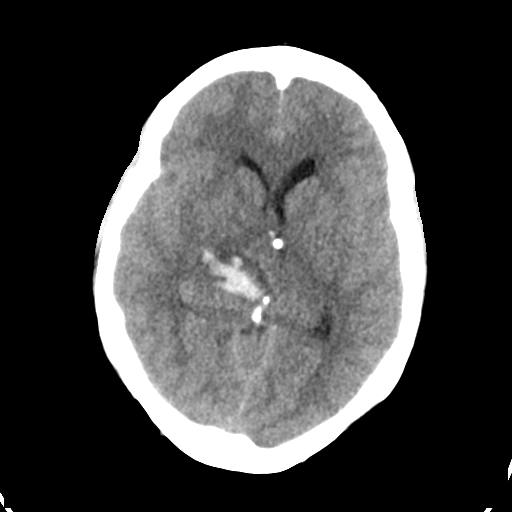
[im 18/32  brain]
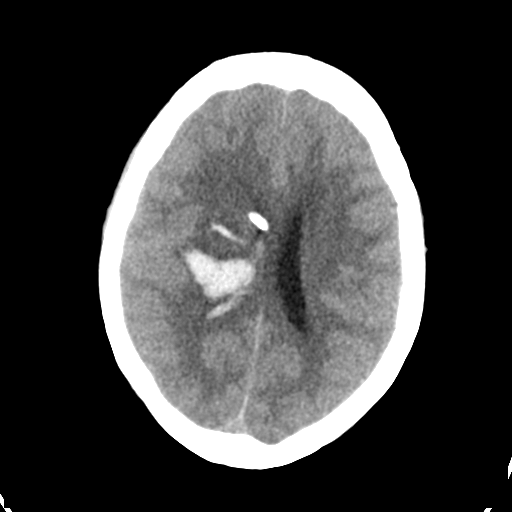
[im 18/32  bone]
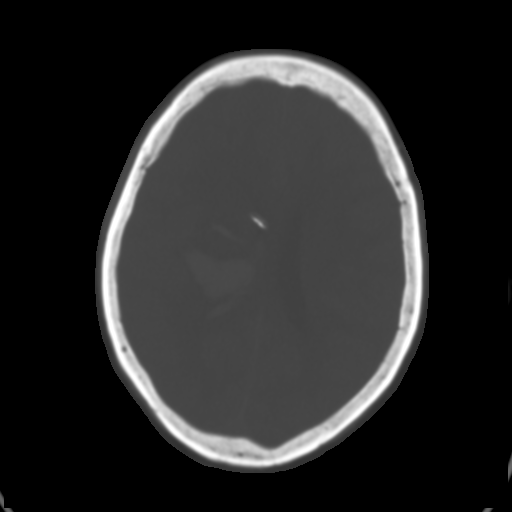
[im 22/32  brain]
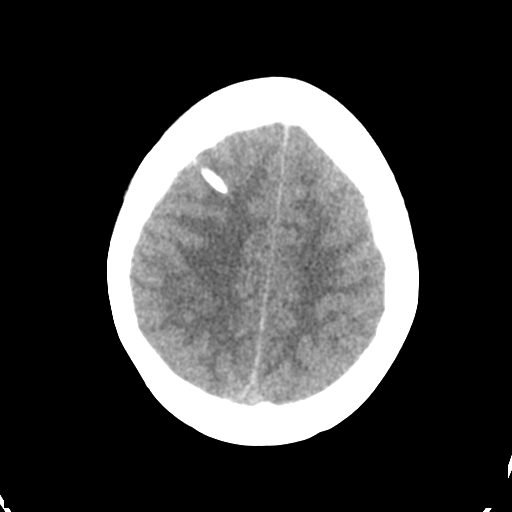
[im 25/32  brain]
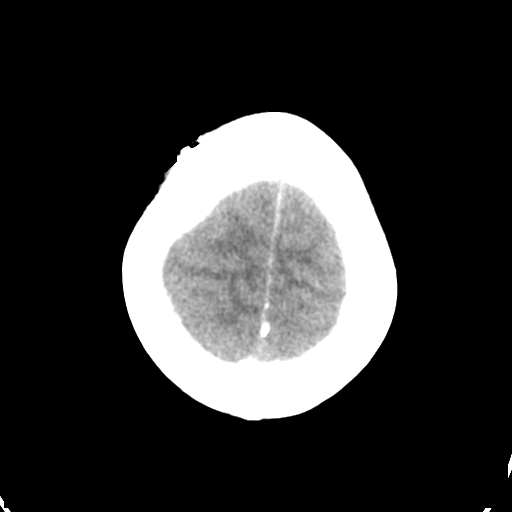
[im 29/32  brain]
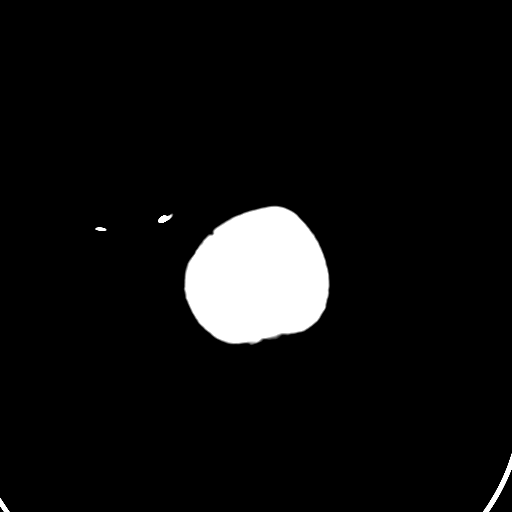

[Series 5: head 3.0 mpr cor · coronal · 0.30mm/px · 3 of 65 slices shown]
[im 22/65  brain]
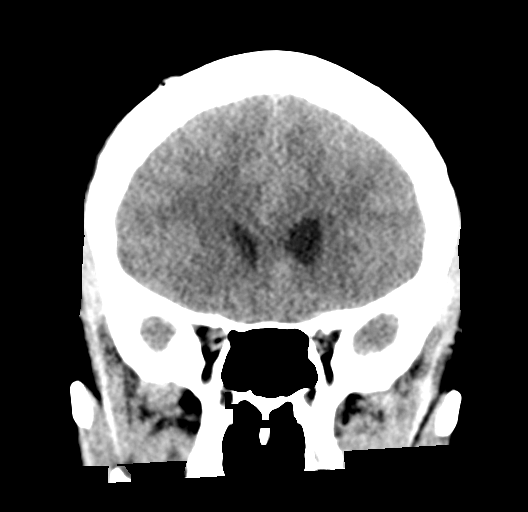
[im 29/65  brain]
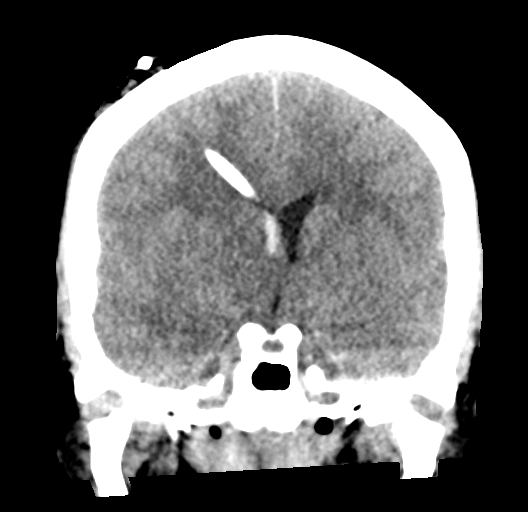
[im 36/65  brain]
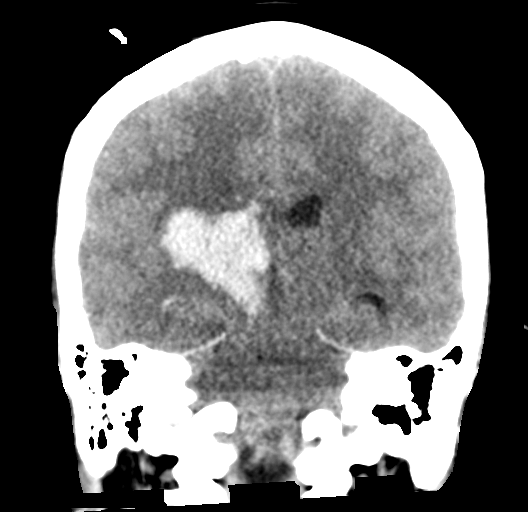

[Series 6: head 3.0 mpr sag · sagittal · 0.30mm/px · 3 of 56 slices shown]
[im 19/56  brain]
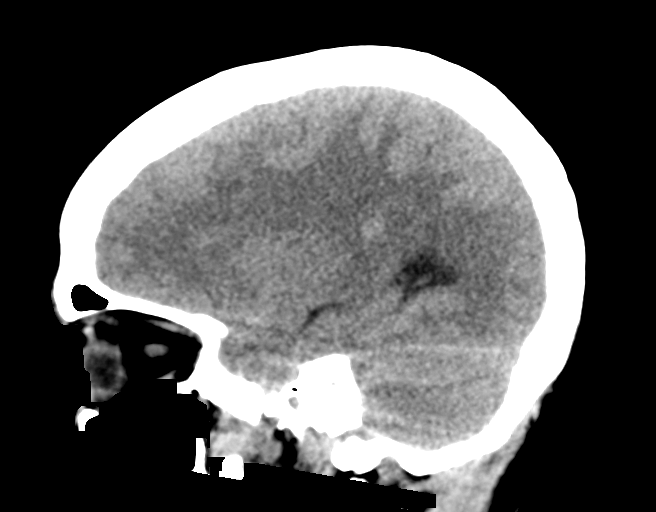
[im 28/56  brain]
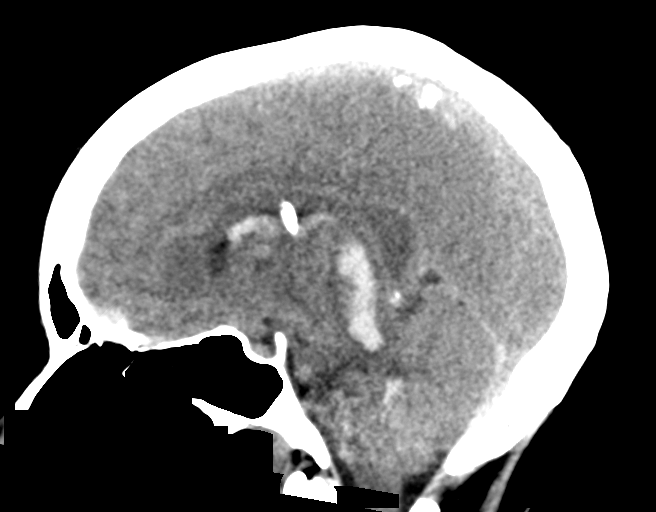
[im 37/56  brain]
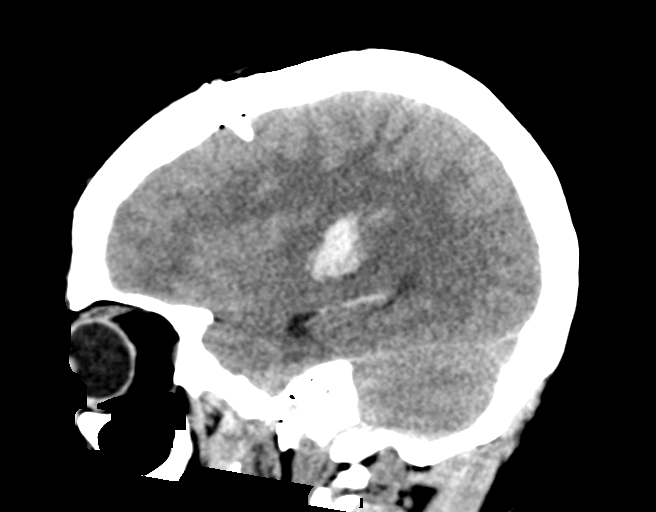

[14 of 47 positions shown; findings below may reference images not displayed]

FINDINGS: Brain: Redemonstrated region of intraparenchymal hemorrhage centered
within the right thalamus and basal ganglia seen extending
inferiorly into the right cerebral peduncle midbrain with associated
intraventricular extension with hemorrhage in the right lateral
ventricle and fourth ventricle. Overall size of this hemorrhage is
not significantly changed from prior. Surrounding hypoattenuating
edema is again seen. Stable mass effect with some effacement of the
right lateral ventricle and 3 mm of right to left midline shift. No
new sites of hemorrhage are identified. No CT evidence of large
vascular territory infarct. A right frontal approach ventriculostomy
catheter is placed with tip crossing midline, terminating near the
left foramen of LOJUSTE.

Vascular: No hyperdense vessel or unexpected calcification.

Skull: Postsurgical changes from ventriculostomy catheter placement
including crescentic right frontal scalp swelling, fluid and gas
near the insertion site.

Sinuses/Orbits: Paranasal sinuses and mastoid air cells are
predominantly clear. Some expansile lucent changes noted in the
floor the right maxillary sinus likely related to extensive
periapical lucencies better seen on CT angiography images. Included
orbital structures are unremarkable.

Other: None.
IMPRESSION: 1. Redemonstrated region of intraparenchymal hemorrhage centered
within the right thalamus and basal ganglia extending into the
cerebral peduncle and midbrain with associated intraventricular
extension and hemorrhage in the right lateral ventricle and fourth
ventricle. Overall size of this hemorrhage is not significantly
changed from prior.
2. Stable mass effect with some effacement of the right lateral
ventricle and 3 mm of right to left midline shift.
3. Postsurgical changes from ventriculostomy catheter placement
including crescentic right frontal scalp swelling, fluid and gas
near the insertion site.
4. Some expansile lucent changes in the floor the right maxillary
sinus likely related to extensive periapical lucencies better seen
on CT angiography images.

## 2020-08-27 IMAGING — CT CT ANGIO NECK
1 of 11 series · 5 of 33 positions shown · IV contrast (omnipaque)
Comparison: None.

CLINICAL DATA: intracranial hemorrhage

EXAM:
CT ANGIOGRAPHY HEAD AND NECK
TECHNIQUE: Multidetector CT imaging of the head and neck was performed using
the standard protocol during bolus administration of intravenous
contrast. Multiplanar CT image reconstructions and MIPs were
obtained to evaluate the vascular anatomy. Carotid stenosis
measurements (when applicable) are obtained utilizing NASCET
criteria, using the distal internal carotid diameter as the
denominator.
CONTRAST:  80 mL OMNIPAQUE IOHEXOL 350 MG/ML SOLN

[Series 11: cta neck axial · axial · 0.43mm/px · z∈[-249,-34]mm · 5 of 323 slices shown]
[im 54/323  soft-tissue]
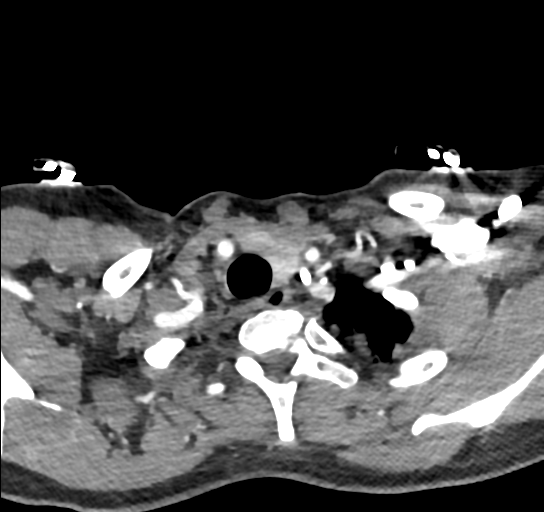
[im 108/323  bone]
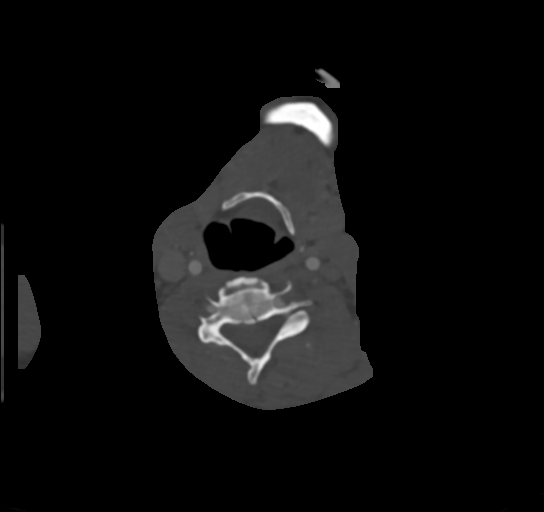
[im 162/323  soft-tissue]
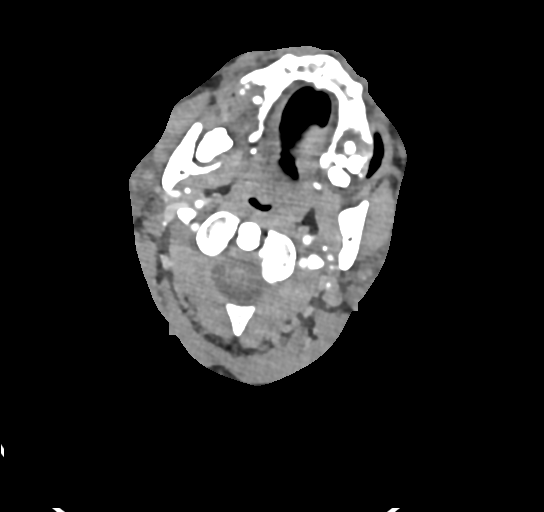
[im 215/323  bone]
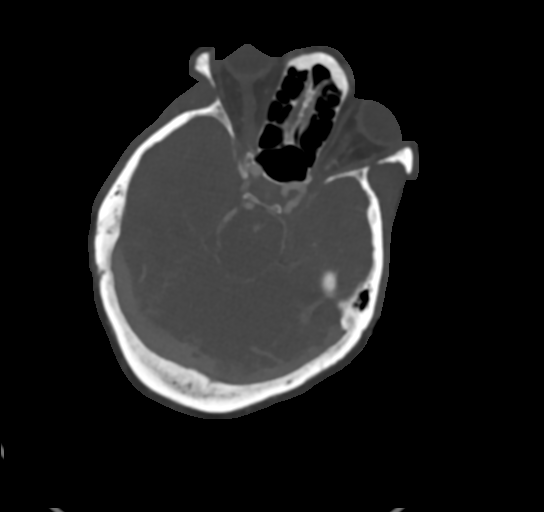
[im 269/323  soft-tissue]
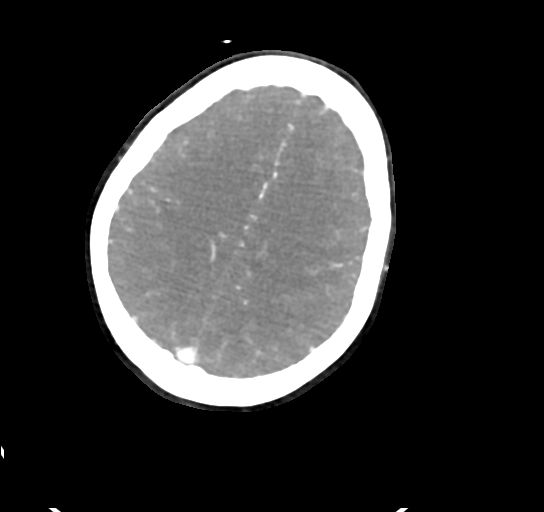

[5 of 33 positions shown; findings below may reference images not displayed]

FINDINGS: Intraparenchymal hemorrhage centered in the right thalamus and basal
ganglia with extension into the right lateral ventricle is
unchanged.

CTA NECK FINDINGS

SKELETON: There is no bony spinal canal stenosis. No lytic or
blastic lesion.

OTHER NECK: Normal pharynx, larynx and major salivary glands. No
cervical lymphadenopathy. Unremarkable thyroid gland.

UPPER CHEST: No pneumothorax or pleural effusion. No nodules or
masses.

AORTIC ARCH:

There is no calcific atherosclerosis of the aortic arch. There is no
aneurysm, dissection or hemodynamically significant stenosis of the
visualized portion of the aorta. Conventional 3 vessel aortic
branching pattern. The visualized proximal subclavian arteries are
widely patent.

RIGHT CAROTID SYSTEM: Normal without aneurysm, dissection or
stenosis.

LEFT CAROTID SYSTEM: Normal without aneurysm, dissection or
stenosis.

VERTEBRAL ARTERIES: Left dominant configuration. Both origins are
clearly patent. There is no dissection, occlusion or flow-limiting
stenosis to the skull base (V1-V3 segments).

CTA HEAD FINDINGS

POSTERIOR CIRCULATION:

--Vertebral arteries: Normal V4 segments.

--Inferior cerebellar arteries: Normal.

--Basilar artery: Normal.

--Superior cerebellar arteries: Normal.

--Posterior cerebral arteries (PCA): Normal.

ANTERIOR CIRCULATION:

--Intracranial internal carotid arteries: Normal.

--Anterior cerebral arteries (ACA): Normal. Both A1 segments are
present. Patent anterior communicating artery (a-comm).

--Middle cerebral arteries (MCA): Normal.

VENOUS SINUSES: As permitted by contrast timing, patent.

ANATOMIC VARIANTS: None

Review of the MIP images confirms the above findings.
IMPRESSION: 1. Unchanged size of intraparenchymal hemorrhage centered in the
right thalamus and basal ganglia with extension into the right
lateral ventricle.
2. No intracranial arterial occlusion or high-grade stenosis.
3. No dissection, aneurysm or hemodynamically significant stenosis
of the carotid or vertebral arteries.
4. No intracranial aneurysm or vascular lesion.

## 2020-08-27 MED ORDER — MORPHINE SULFATE (PF) 4 MG/ML IV SOLN
INTRAVENOUS | Status: AC
  Filled 2020-08-27: qty 1

## 2020-08-27 MED ORDER — DOCUSATE SODIUM 50 MG/5ML PO LIQD
100.0000 mg | Freq: Two times a day (BID) | ORAL | Status: DC
  Administered 2020-08-27 – 2020-09-09 (×22): 100 mg
  Filled 2020-08-27 (×22): qty 10

## 2020-08-27 MED ORDER — PHENYLEPHRINE 40 MCG/ML (10ML) SYRINGE FOR IV PUSH (FOR BLOOD PRESSURE SUPPORT): 200.0000 ug | PREFILLED_SYRINGE | Freq: Once | INTRAVENOUS | Status: AC

## 2020-08-27 MED ORDER — PROPOFOL 1000 MG/100ML IV EMUL
0.0000 ug/kg/min | INTRAVENOUS | Status: DC
  Filled 2020-08-27: qty 100

## 2020-08-27 MED ORDER — FENTANYL CITRATE (PF) 100 MCG/2ML IJ SOLN
50.0000 ug | INTRAMUSCULAR | Status: DC | PRN
  Administered 2020-08-29 – 2020-09-04 (×2): 50 ug via INTRAVENOUS
  Filled 2020-08-27 (×3): qty 2

## 2020-08-27 MED ORDER — MORPHINE SULFATE (PF) 4 MG/ML IV SOLN
4.0000 mg | Freq: Once | INTRAVENOUS | Status: AC
  Administered 2020-08-27: 4 mg via INTRAVENOUS

## 2020-08-27 MED ORDER — ORAL CARE MOUTH RINSE
15.0000 mL | OROMUCOSAL | Status: DC
  Administered 2020-08-27 – 2020-10-15 (×456): 15 mL via OROMUCOSAL

## 2020-08-27 MED ORDER — SODIUM CHLORIDE 0.9 % IV SOLN: INTRAVENOUS | Status: DC

## 2020-08-27 MED ORDER — CHLORHEXIDINE GLUCONATE 0.12% ORAL RINSE (MEDLINE KIT)
15.0000 mL | Freq: Two times a day (BID) | OROMUCOSAL | Status: DC
  Administered 2020-08-27 – 2021-04-16 (×447): 15 mL via OROMUCOSAL

## 2020-08-27 MED ORDER — SODIUM CHLORIDE 3 % IV SOLN
INTRAVENOUS | Status: DC
  Filled 2020-08-27 (×4): qty 500

## 2020-08-27 MED ORDER — PHENYLEPHRINE 40 MCG/ML (10ML) SYRINGE FOR IV PUSH (FOR BLOOD PRESSURE SUPPORT)
PREFILLED_SYRINGE | INTRAVENOUS | Status: AC
  Administered 2020-08-27: 200 ug via INTRAVENOUS
  Filled 2020-08-27: qty 10

## 2020-08-27 MED ORDER — ACETAMINOPHEN 650 MG RE SUPP
650.0000 mg | RECTAL | Status: DC | PRN
  Administered 2020-08-27 – 2020-10-22 (×2): 650 mg via RECTAL
  Filled 2020-08-27 (×4): qty 1

## 2020-08-27 MED ORDER — FENTANYL CITRATE (PF) 100 MCG/2ML IJ SOLN
100.0000 ug | Freq: Once | INTRAMUSCULAR | Status: AC
  Administered 2020-08-27: 100 ug via INTRAVENOUS

## 2020-08-27 MED ORDER — FENTANYL CITRATE (PF) 100 MCG/2ML IJ SOLN
50.0000 ug | INTRAMUSCULAR | Status: DC | PRN
  Administered 2020-08-29 – 2020-09-03 (×2): 100 ug via INTRAVENOUS
  Filled 2020-08-27: qty 2

## 2020-08-27 MED ORDER — CHLORHEXIDINE GLUCONATE CLOTH 2 % EX PADS
6.0000 | MEDICATED_PAD | Freq: Every day | CUTANEOUS | Status: DC
  Administered 2020-08-27 – 2021-04-16 (×219): 6 via TOPICAL

## 2020-08-27 MED ORDER — CHLORHEXIDINE GLUCONATE 0.12 % MT SOLN
15.0000 mL | Freq: Two times a day (BID) | OROMUCOSAL | Status: DC
  Administered 2020-08-27 (×2): 15 mL via OROMUCOSAL

## 2020-08-27 MED ORDER — MORPHINE SULFATE (PF) 4 MG/ML IV SOLN
5.0000 mg | Freq: Once | INTRAVENOUS | Status: AC
  Administered 2020-08-27: 5 mg via INTRAVENOUS
  Filled 2020-08-27: qty 2

## 2020-08-27 MED ORDER — LABETALOL HCL 5 MG/ML IV SOLN
5.0000 mg | INTRAVENOUS | Status: DC | PRN
Start: 2020-08-27 — End: 2020-09-14
  Administered 2020-08-27 – 2020-09-01 (×3): 10 mg via INTRAVENOUS
  Administered 2020-09-02 – 2020-09-06 (×7): 20 mg via INTRAVENOUS
  Administered 2020-09-13: 5 mg via INTRAVENOUS
  Administered 2020-09-13 – 2020-09-14 (×3): 10 mg via INTRAVENOUS
  Filled 2020-08-27 (×16): qty 4

## 2020-08-27 MED ORDER — LORAZEPAM 2 MG/ML IJ SOLN
2.0000 mg | Freq: Once | INTRAMUSCULAR | Status: AC
  Administered 2020-08-27: 2 mg via INTRAVENOUS
  Filled 2020-08-27: qty 1

## 2020-08-27 MED ORDER — PHENYLEPHRINE HCL-NACL 10-0.9 MG/250ML-% IV SOLN
25.0000 ug/min | INTRAVENOUS | Status: DC
  Administered 2020-08-27: 50 ug/min via INTRAVENOUS
  Administered 2020-08-27: 25 ug/min via INTRAVENOUS
  Filled 2020-08-27: qty 250

## 2020-08-27 MED ORDER — SENNOSIDES-DOCUSATE SODIUM 8.6-50 MG PO TABS: 1.0000 | ORAL_TABLET | Freq: Two times a day (BID) | ORAL | Status: DC

## 2020-08-27 MED ORDER — ETOMIDATE 2 MG/ML IV SOLN
10.0000 mg | Freq: Once | INTRAVENOUS | Status: AC
  Administered 2020-08-27: 10 mg via INTRAVENOUS

## 2020-08-27 MED ORDER — ACETAMINOPHEN 160 MG/5ML PO SOLN
650.0000 mg | ORAL | Status: DC | PRN
Start: 2020-08-27 — End: 2020-12-08
  Administered 2020-08-29 – 2020-12-07 (×94): 650 mg
  Filled 2020-08-27 (×99): qty 20.3

## 2020-08-27 MED ORDER — CLEVIDIPINE BUTYRATE 0.5 MG/ML IV EMUL
0.0000 mg/h | INTRAVENOUS | Status: DC
  Administered 2020-08-27 (×2): 32 mg/h via INTRAVENOUS
  Administered 2020-08-27: 10 mg/h via INTRAVENOUS
  Administered 2020-08-27: 31 mg/h via INTRAVENOUS
  Administered 2020-08-27 (×2): 32 mg/h via INTRAVENOUS
  Filled 2020-08-27 (×6): qty 50

## 2020-08-27 MED ORDER — POLYETHYLENE GLYCOL 3350 17 G PO PACK
17.0000 g | PACK | Freq: Every day | ORAL | Status: DC
  Administered 2020-08-27 – 2020-09-06 (×10): 17 g
  Filled 2020-08-27 (×11): qty 1

## 2020-08-27 MED ORDER — MIDAZOLAM HCL 2 MG/2ML IJ SOLN
2.0000 mg | Freq: Once | INTRAMUSCULAR | Status: AC
  Administered 2020-08-27: 2 mg via INTRAVENOUS

## 2020-08-27 MED ORDER — ORAL CARE MOUTH RINSE
15.0000 mL | Freq: Two times a day (BID) | OROMUCOSAL | Status: DC
  Administered 2020-08-27 (×2): 15 mL via OROMUCOSAL

## 2020-08-27 MED ORDER — ACETAMINOPHEN 325 MG PO TABS
650.0000 mg | ORAL_TABLET | ORAL | Status: DC | PRN
  Administered 2020-09-23 – 2020-11-24 (×6): 650 mg via ORAL
  Filled 2020-08-27 (×8): qty 2

## 2020-08-27 MED ORDER — PANTOPRAZOLE SODIUM 40 MG IV SOLR
40.0000 mg | Freq: Every day | INTRAVENOUS | Status: DC
  Administered 2020-08-27: 40 mg via INTRAVENOUS
  Filled 2020-08-27: qty 40

## 2020-08-27 MED ORDER — ROCURONIUM BROMIDE 10 MG/ML (PF) SYRINGE
60.0000 mg | PREFILLED_SYRINGE | Freq: Once | INTRAVENOUS | Status: AC
  Administered 2020-08-27: 60 mg via INTRAVENOUS

## 2020-08-27 MED ORDER — PHENYLEPHRINE HCL-NACL 10-0.9 MG/250ML-% IV SOLN
INTRAVENOUS | Status: AC
  Filled 2020-08-27: qty 250

## 2020-08-27 MED ORDER — STROKE: EARLY STAGES OF RECOVERY BOOK
Freq: Once | Status: DC
  Filled 2020-08-27: qty 1

## 2020-08-27 MED ORDER — CLEVIDIPINE BUTYRATE 0.5 MG/ML IV EMUL
0.0000 mg/h | INTRAVENOUS | Status: DC
  Administered 2020-08-27: 10 mg/h via INTRAVENOUS
  Administered 2020-08-27: 21 mg/h via INTRAVENOUS
  Administered 2020-08-27: 32 mg/h via INTRAVENOUS
  Administered 2020-08-27: 27 mg/h via INTRAVENOUS
  Administered 2020-08-27: 11 mg/h via INTRAVENOUS
  Administered 2020-08-27: 32 mg/h via INTRAVENOUS
  Filled 2020-08-27: qty 50
  Filled 2020-08-27: qty 100
  Filled 2020-08-27 (×2): qty 50

## 2020-08-27 NOTE — Plan of Care (Signed)
Neurologic exam changed around 1200, pupils were already unequal during my first assessment, STAT CT obtained. Then pupils became non-reactive, Dr. [name] notified, ventriculostomy placed.

## 2020-08-27 NOTE — Progress Notes (Signed)
Patient ID: Charlene [name], female   DOB: July 29, 1965, 55 y.o.   MRN: [id number] BP (!) 174/98   Pulse 63   Temp 98.1 F (36.7 C) (Oral)   Resp (!) 23   Ht 5\' 5"  (1.651 m)   Wt 61.2 kg   LMP 03/03/2014   SpO2 97%   BMI 22.45 kg/m  Mrs. Propst's exam is unchanged. Actively moving in the bed, purposeful movements in the right upper extremity. Movement in both lower extremities. Perrl, maintaining her airway as she had during my first exam.  I will defer placing the ventricular catheter at this time. There was no change in the amount of blood in the ventricles, nor a change in the ventricular size. I will order a repeat CT for later this morning to assess. If she neurologically worsens a reevaluation will be done.

## 2020-08-27 NOTE — Progress Notes (Signed)
eLink Physician-Brief Progress Note Patient Name: TRINIDAD INGLE DOB: [birth year] MRN: [id number]   Date of Service  08/27/2020  HPI/Events of Note  ABG    Component Value Date/Time   PHART 7.558 (H) 08/27/2020 2020   PCO2ART 35.5 08/27/2020 2020   PO2ART 611 (H) 08/27/2020 2020   HCO3 31.7 (H) 08/27/2020 2020   TCO2 33 (H) 08/27/2020 2020   O2SAT 100.0 08/27/2020 2020   Patient was just intubated and this is the first post-intubation ABG.   RT would like to decrease RR from 20 to 14/min. Patient is on PEEP 5.   eICU Interventions  Agree with reduction in RR to 14/min. Wean FiO2 as tolerated.     Intervention Category Major Interventions: Respiratory failure - evaluation and management  Janae Bridgeman 08/27/2020, 8:37 PM

## 2020-08-27 NOTE — Progress Notes (Signed)
  Echocardiogram 2D Echocardiogram has been performed.  Stark Bray Swaim 08/27/2020, 10:13 AM

## 2020-08-27 NOTE — Progress Notes (Addendum)
Neurosurgery MD Cabbell paged twice ([id number], [id number]) to inquire if MD is still coming up for a possible drain placement for patient.

## 2020-08-27 NOTE — Progress Notes (Addendum)
STROKE TEAM PROGRESS NOTE   INTERVAL HISTORY Patient is evaluated at the bedside.  Husband present in the room.  He states she is noncompliant with her HTN medications.  On neuro examination today, patient is stuporous , but intermittently agitated with flail movement of right upper and lower extremities, eyes closed, not open with voice or pain. Patient able to follow simple commands on the right hand but not right foot.  Right pupil is enlarged 4 mmmand sluggish to light stimulus resembles tonic pupil and left pupil is constricted.  Repeat CT Head on 08/27/20 showed- Stable parenchymal hemorrhage in the right thalamus and basal ganglia with intraventricular extension. Echo LVEF 60-65%. mild concentric left ventricular hypertrophy. HgbA1c 6.0. LDL 167.  Pt is NPO on NS 25 cc /hr. PT/OT and Speech and swallow Eval pending due to AMS. NS consulted, regarding putting ventricular catheter at this time.  Husband to make that decision about ventricular catheter placement soon.   Vitals:   08/27/20 1200 08/27/20 1215 08/27/20 1230 08/27/20 1245  BP: 124/66 (!) 158/59 (!) 161/61 124/63  Pulse: 71 93 76 86  Resp: 18 (!) 31 (!) 22 (!) 21  Temp: 99.5 F (37.5 C)     TempSrc: Oral     SpO2: 100% 100% 100% 100%  Weight:      Height:       CBC:  Recent Labs  Lab 08/26/20 2205  WBC 8.3  NEUTROABS 3.5  HGB 11.7*  HCT 37.3  MCV 76.6*  PLT 304   Basic Metabolic Panel:  Recent Labs  Lab 08/26/20 2205  NA 140  K 3.2*  CL 98  CO2 28  GLUCOSE 131*  BUN 29*  CREATININE 1.09*  CALCIUM 9.8   Lipid Panel:  Recent Labs  Lab 08/27/20 0251  CHOL 265*  TRIG 129  HDL 72  CHOLHDL 3.7  VLDL 26  LDLCALC 338*   HgbA1c:  Recent Labs  Lab 08/27/20 0251  HGBA1C 6.0*   Urine Drug Screen: No results for input(s): LABOPIA, COCAINSCRNUR, LABBENZ, AMPHETMU, THCU, LABBARB in the last 168 hours.  Alcohol Level  Recent Labs  Lab 08/26/20 2225  ETH <10    IMAGING past 24 hours CT Angio Head W  or Wo Contrast  Result Date: 08/27/2020 CLINICAL DATA:  intracranial hemorrhage EXAM: CT ANGIOGRAPHY HEAD AND NECK TECHNIQUE: Multidetector CT imaging of the head and neck was performed using the standard protocol during bolus administration of intravenous contrast. Multiplanar CT image reconstructions and MIPs were obtained to evaluate the vascular anatomy. Carotid stenosis measurements (when applicable) are obtained utilizing NASCET criteria, using the distal internal carotid diameter as the denominator. CONTRAST:  80 mL OMNIPAQUE IOHEXOL 350 MG/ML SOLN COMPARISON:  None. FINDINGS: Intraparenchymal hemorrhage centered in the right thalamus and basal ganglia with extension into the right lateral ventricle is unchanged. CTA NECK FINDINGS SKELETON: There is no bony spinal canal stenosis. No lytic or blastic lesion. OTHER NECK: Normal pharynx, larynx and major salivary glands. No cervical lymphadenopathy. Unremarkable thyroid gland. UPPER CHEST: No pneumothorax or pleural effusion. No nodules or masses. AORTIC ARCH: There is no calcific atherosclerosis of the aortic arch. There is no aneurysm, dissection or hemodynamically significant stenosis of the visualized portion of the aorta. Conventional 3 vessel aortic branching pattern. The visualized proximal subclavian arteries are widely patent. RIGHT CAROTID SYSTEM: Normal without aneurysm, dissection or stenosis. LEFT CAROTID SYSTEM: Normal without aneurysm, dissection or stenosis. VERTEBRAL ARTERIES: Left dominant configuration. Both origins are clearly patent. There is no  dissection, occlusion or flow-limiting stenosis to the skull base (V1-V3 segments). CTA HEAD FINDINGS POSTERIOR CIRCULATION: --Vertebral arteries: Normal V4 segments. --Inferior cerebellar arteries: Normal. --Basilar artery: Normal. --Superior cerebellar arteries: Normal. --Posterior cerebral arteries (PCA): Normal. ANTERIOR CIRCULATION: --Intracranial internal carotid arteries: Normal. --Anterior  cerebral arteries (ACA): Normal. Both A1 segments are present. Patent anterior communicating artery (a-comm). --Middle cerebral arteries (MCA): Normal. VENOUS SINUSES: As permitted by contrast timing, patent. ANATOMIC VARIANTS: None Review of the MIP images confirms the above findings. IMPRESSION: 1. Unchanged size of intraparenchymal hemorrhage centered in the right thalamus and basal ganglia with extension into the right lateral ventricle. 2. No intracranial arterial occlusion or high-grade stenosis. 3. No dissection, aneurysm or hemodynamically significant stenosis of the carotid or vertebral arteries. 4. No intracranial aneurysm or vascular lesion. Electronically Signed   By: Deatra Robinson M.D.   On: 08/27/2020 02:40   CT HEAD WO CONTRAST  Result Date: 08/27/2020 CLINICAL DATA:  Follow-up hemorrhage EXAM: CT HEAD WITHOUT CONTRAST TECHNIQUE: Contiguous axial images were obtained from the base of the skull through the vertex without intravenous contrast. COMPARISON:  CT from earlier in the same day. FINDINGS: Brain: There is again noted a large area of parenchymal hemorrhage in the right thalamus and basal ganglia with extension into the right lateral ventricle. Blood is also noted within the fourth ventricle as well. No significant ventricular dilatation is noted. No findings to suggest acute infarct are noted. Vascular: No hyperdense vessel or unexpected calcification. Skull: Normal. Negative for fracture or focal lesion. Sinuses/Orbits: No acute finding. Other: None. IMPRESSION: Stable parenchymal hemorrhage in the right thalamus and basal ganglia with intraventricular extension as described. No new focal abnormality is noted. Electronically Signed   By: Alcide Clever M.D.   On: 08/27/2020 09:11   CT Angio Neck W and/or Wo Contrast  Result Date: 08/27/2020 CLINICAL DATA:  intracranial hemorrhage EXAM: CT ANGIOGRAPHY HEAD AND NECK TECHNIQUE: Multidetector CT imaging of the head and neck was performed  using the standard protocol during bolus administration of intravenous contrast. Multiplanar CT image reconstructions and MIPs were obtained to evaluate the vascular anatomy. Carotid stenosis measurements (when applicable) are obtained utilizing NASCET criteria, using the distal internal carotid diameter as the denominator. CONTRAST:  80 mL OMNIPAQUE IOHEXOL 350 MG/ML SOLN COMPARISON:  None. FINDINGS: Intraparenchymal hemorrhage centered in the right thalamus and basal ganglia with extension into the right lateral ventricle is unchanged. CTA NECK FINDINGS SKELETON: There is no bony spinal canal stenosis. No lytic or blastic lesion. OTHER NECK: Normal pharynx, larynx and major salivary glands. No cervical lymphadenopathy. Unremarkable thyroid gland. UPPER CHEST: No pneumothorax or pleural effusion. No nodules or masses. AORTIC ARCH: There is no calcific atherosclerosis of the aortic arch. There is no aneurysm, dissection or hemodynamically significant stenosis of the visualized portion of the aorta. Conventional 3 vessel aortic branching pattern. The visualized proximal subclavian arteries are widely patent. RIGHT CAROTID SYSTEM: Normal without aneurysm, dissection or stenosis. LEFT CAROTID SYSTEM: Normal without aneurysm, dissection or stenosis. VERTEBRAL ARTERIES: Left dominant configuration. Both origins are clearly patent. There is no dissection, occlusion or flow-limiting stenosis to the skull base (V1-V3 segments). CTA HEAD FINDINGS POSTERIOR CIRCULATION: --Vertebral arteries: Normal V4 segments. --Inferior cerebellar arteries: Normal. --Basilar artery: Normal. --Superior cerebellar arteries: Normal. --Posterior cerebral arteries (PCA): Normal. ANTERIOR CIRCULATION: --Intracranial internal carotid arteries: Normal. --Anterior cerebral arteries (ACA): Normal. Both A1 segments are present. Patent anterior communicating artery (a-comm). --Middle cerebral arteries (MCA): Normal. VENOUS SINUSES: As permitted by  contrast timing,  patent. ANATOMIC VARIANTS: None Review of the MIP images confirms the above findings. IMPRESSION: 1. Unchanged size of intraparenchymal hemorrhage centered in the right thalamus and basal ganglia with extension into the right lateral ventricle. 2. No intracranial arterial occlusion or high-grade stenosis. 3. No dissection, aneurysm or hemodynamically significant stenosis of the carotid or vertebral arteries. 4. No intracranial aneurysm or vascular lesion. Electronically Signed   By: Deatra Robinson M.D.   On: 08/27/2020 02:40   ECHOCARDIOGRAM COMPLETE  Result Date: 08/27/2020    ECHOCARDIOGRAM REPORT   Patient Name:   CYNDA SOULE Date of Exam: 08/27/2020 Medical Rec #:  [id number]        Height:       65.0 in Accession #:    [id number]       Weight:       134.9 lb Date of Birth:  02/12/1966        BSA:          1.673 m Patient Age:    54 years         BP:           133/89 mmHg Patient Gender: F                HR:           71 bpm. Exam Location:  Inpatient Procedure: 2D Echo, Cardiac Doppler and Color Doppler Indications:    Stroke 434.91 / I163.9  History:        Patient has prior history of Echocardiogram examinations, most                 recent 02/22/2014. Risk Factors:Hypertension and Non-Smoker.  Sonographer:    Renella Cunas RDCS Referring Phys: [id number] ASHISH ARORA IMPRESSIONS  1. Left ventricular ejection fraction, by estimation, is 60 to 65%. The left ventricle has normal function. The left ventricle has no regional wall motion abnormalities. There is mild concentric left ventricular hypertrophy. Left ventricular diastolic parameters were normal.  2. Right ventricular systolic function is normal. The right ventricular size is normal. There is normal pulmonary artery systolic pressure.  3. The mitral valve is normal in structure. Trivial mitral valve regurgitation. No evidence of mitral stenosis.  4. The aortic valve is tricuspid. Aortic valve regurgitation is not visualized. No aortic  stenosis is present.  5. The inferior vena cava is normal in size with <50% respiratory variability, suggesting right atrial pressure of 8 mmHg. Conclusion(s)/Recommendation(s): Normal biventricular function without evidence of hemodynamically significant valvular heart disease. No intracardiac source of embolism detected on this transthoracic study. A transesophageal echocardiogram is recommended to exclude cardiac source of embolism if clinically indicated. FINDINGS  Left Ventricle: Left ventricular ejection fraction, by estimation, is 60 to 65%. The left ventricle has normal function. The left ventricle has no regional wall motion abnormalities. The left ventricular internal cavity size was normal in size. There is  mild concentric left ventricular hypertrophy. Left ventricular diastolic parameters were normal. Right Ventricle: The right ventricular size is normal. No increase in right ventricular wall thickness. Right ventricular systolic function is normal. There is normal pulmonary artery systolic pressure. The tricuspid regurgitant velocity is 2.40 m/s, and  with an assumed right atrial pressure of 8 mmHg, the estimated right ventricular systolic pressure is 31.0 mmHg. Left Atrium: Left atrial size was normal in size. Right Atrium: Right atrial size was normal in size. Pericardium: Trivial pericardial effusion is present. Mitral Valve: The mitral valve is normal in structure. Trivial mitral  valve regurgitation. No evidence of mitral valve stenosis. Tricuspid Valve: The tricuspid valve is normal in structure. Tricuspid valve regurgitation is trivial. No evidence of tricuspid stenosis. Aortic Valve: The aortic valve is tricuspid. Aortic valve regurgitation is not visualized. No aortic stenosis is present. Pulmonic Valve: The pulmonic valve was not well visualized. Pulmonic valve regurgitation is trivial. Aorta: The aortic root, ascending aorta and aortic arch are all structurally normal, with no evidence of  dilitation or obstruction. Venous: The inferior vena cava is normal in size with less than 50% respiratory variability, suggesting right atrial pressure of 8 mmHg. IAS/Shunts: No atrial level shunt detected by color flow Doppler.  LEFT VENTRICLE PLAX 2D LVIDd:         4.40 cm     Diastology LVIDs:         2.90 cm     LV e' medial:    9.57 cm/s LV PW:         1.10 cm     LV E/e' medial:  7.8 LV IVS:        1.10 cm     LV e' lateral:   11.40 cm/s LVOT diam:     1.90 cm     LV E/e' lateral: 6.6 LV SV:         67 LV SV Index:   40 LVOT Area:     2.84 cm  LV Volumes (MOD) LV vol d, MOD A2C: 89.4 ml LV vol d, MOD A4C: 89.5 ml LV vol s, MOD A2C: 32.2 ml LV vol s, MOD A4C: 41.8 ml LV SV MOD A2C:     57.2 ml LV SV MOD A4C:     89.5 ml LV SV MOD BP:      55.2 ml RIGHT VENTRICLE RV S prime:     14.70 cm/s TAPSE (M-mode): 2.3 cm LEFT ATRIUM             Index       RIGHT ATRIUM           Index LA diam:        3.30 cm 1.97 cm/m  RA Area:     10.50 cm LA Vol (A2C):   26.8 ml 16.02 ml/m RA Volume:   22.30 ml  13.33 ml/m LA Vol (A4C):   24.1 ml 14.40 ml/m LA Biplane Vol: 26.2 ml 15.66 ml/m  AORTIC VALVE LVOT Vmax:   128.00 cm/s LVOT Vmean:  97.300 cm/s LVOT VTI:    0.238 m  AORTA Ao Root diam: 2.90 cm MITRAL VALVE               TRICUSPID VALVE MV Area (PHT): 4.39 cm    TR Peak grad:   23.0 mmHg MV Decel Time: 173 msec    TR Vmax:        240.00 cm/s MV E velocity: 75.00 cm/s MV A velocity: 85.70 cm/s  SHUNTS MV E/A ratio:  0.88        Systemic VTI:  0.24 m                            Systemic Diam: 1.90 cm Jodelle Red MD Electronically signed by Jodelle Red MD Signature Date/Time: 08/27/2020/12:47:47 PM    Final    CT HEAD CODE STROKE WO CONTRAST  Result Date: 08/26/2020 CLINICAL DATA:  Code stroke.  Left arm and leg numbness. EXAM: CT HEAD WITHOUT CONTRAST TECHNIQUE: Contiguous axial images were obtained  from the base of the skull through the vertex without intravenous contrast. COMPARISON:  05/05/2020  FINDINGS: Brain: Acute hemorrhage with the epicenter in the right thalamus. Measurement is approximately 3.2 x 2.1 x 2.5 cm (volume = 8.8 cm^3). Intraventricular penetration with blood filling the third and fourth ventricles and nearly filling the right lateral ventricle. No hydrocephalus. No extra-axial collection. Vascular: No primary vascular finding otherwise. Skull: Negative Sinuses/Orbits: Clear/normal Other: Extensive dental and periodontal disease ASPECTS ([place] Stroke Program Early CT Score) - Ganglionic level infarction (caudate, lentiform nuclei, internal capsule, insula, M1-M3 cortex): 7 - Supraganglionic infarction (M4-M6 cortex): 3 Total score (0-10 with 10 being normal): 10 IMPRESSION: 1. Acute hemorrhage with the epicenter in the right thalamus. Measurement is approximately 3.2 x 2.1 x 2.5 cm (volume 8.8 cm^3). Intraventricular penetration with blood filling the third and fourth ventricles and nearly filling the right lateral ventricle. No hydrocephalus. 2. These results were called by telephone at the time of interpretation on 08/26/2020 at 10:35 pm to provider DAVID YAO , who verbally acknowledged these results. Electronically Signed   By: Paulina Fusi M.D.   On: 08/26/2020 22:36    PHYSICAL EXAM GENERAL: Well nourished, well developed, stuporous. HEENT: - Normocephalic and atraumatic. LUNGS - Symmetrical chest rise, No labored breathing noted CV - no JVD, No Peripheral Edema, Tachycardia. ABDOMEN - Soft,  nondistended  Ext: warm, well perfused, intact peripheral pulses, no Peripheral edema.   Neuro - stuporous but intermittently agitated with flail movement of right upper and lower extremities, eyes closed, not open with voice or pain.  Nonverbal, however, on initial exam, patient able to follow simple commands on the right hand but not right foot.  However this was not reoccurred on repetitive testing later.  Left eye downward position, right eye mid position, left pupil pinpoint,  right pupil 4 mm, sluggish to light reaction resembles tonic pupil.  Left facial droop.  Left upper extremity and lower extremity mild withdraw to pain stimulation.  Right upper and lower extremity frequent antigravity movements. Sensation, coordination and gait not tested.  ASSESSMENT/PLAN  [name] is a 55 y.o. female with history of hypertension with difficulty maintaining blood pressures and noncompliance to medications for various reasons, presented to the emergency room at [facility] for left-sided weakness and headaches.  She was appear to be confused and lethargic. She was seen by telemedicine evaluation by neurologist for concern for stroke and a noncontrast head CT showed a right thalamic/basal ganglia hemorrhage 3.2 x 2.1 x 2.5  with intraventricular extension.  He was transferred ED to ED for inpatient neurological evaluation and admission. BP high at  178/109 mmHg on Admission. CTA head & neck showed No intracranial large vessel occlusion or proximal high-grade arterial stenosis. 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 Repeat CT Head on 08/27/20 showed- Stable parenchymal hemorrhage in the right thalamus and basal ganglia with intraventricular extension. ECHO LVEF 60-65%. mild concentric left ventricular hypertrophy. HgbA1c 6.0. LDL 167. NS consulted, regarding putting ventricular catheter.  Husband to make that decision about ventricular catheter placement soon.   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 No hydrocephalus.   CTA head & neck - No intracranial large vessel occlusion or proximal high-grade arterial stenosis. Soft plaque results moderate/severe stenosis at  the origin of both vertebral arteries.   Repeat CT Head (08/27/20) - Stable  parenchymal hemorrhage in the right thalamus and basal ganglia with intraventricular extension as described.   CT repeat in am  2D Echo LVEF 60-65%. mild concentric left ventricular hypertrophy  LDL 167  HgbA1c 6.0  VTE prophylaxis - None due to ICH.   No antithrombotic prior to admission, now on No antithrombotic due to ICH.   Therapy recommendations: Pending  Disposition: TBD  Obstructive hydrocephalus  CT Head -  IVH in the third and fourth ventricles and nearly filling the right lateral ventricle.  Ventricle size slightly larger than her baseline CT in 04/2020  NSG on board  Currently no need EVD but Dr. Franky Machoabbell is following  Hypertensive emergency  Home meds:  Coreg 6.25 mg and Hydrochlorothiazide 25 mg daily  Unstable  . BP goal SP <160 mmHG for now . Continue Claviprex.  . Continue labetalol PRN.  Marland Kitchen. Long-term BP goal normotensive  Hyperlipidemia  Home meds: None  LDL 167, goal < 70  Hold starting statin due to acute ICH  Consider statin at discharge  Other Stroke Risk Factors  Noncompliance  Other active issues  AKI Cre 1.09 - monitoring on IVF  Hospital day # 0  This patient is critically ill due to ICH, IVH and hydrocephalus with hypertensive emergency and at significant risk of neurological worsening, death form hematoma expansion, obstructive hydrocephalus, seizure, hypertensive encephalopathy. This patient's care requires constant monitoring of vital signs, hemodynamics, respiratory and cardiac monitoring, review of multiple databases, neurological assessment, discussion with family, other specialists and medical decision making of high complexity. I spent 45 minutes of neurocritical care time in the care of this patient. I had long discussion with husband at bedside, updated pt current condition, treatment plan and potential prognosis, and answered all the questions.  He expressed understanding and appreciation.    Marvel PlanJindong Jalaya Sarver, MD PhD Stroke  Neurology 08/27/2020 6:20 PM    To contact Stroke Continuity provider, please refer to [email or web address]. After hours, contact General Neurology

## 2020-08-27 NOTE — Progress Notes (Signed)
RT note. Pt. Transported to CT and back without any complications. 

## 2020-08-27 NOTE — Consult Note (Signed)
Reason for Consult:ivh, ich Referring Physician: Mack Hook is an 55 y.o. female.  HPI: whom called her husband this evening approximately 2130 stating her left arm felt strange and that she was driving home. When he found her at home her mental status had deteriorated. She was not moving her left side well and her speech was difficult to understand. He drove her to the [facility]. Head ct showed a right thalamic ich with intraventricular extension. I was called for possible ventricular catheter placement  Past Medical History:  Diagnosis Date  . Hypertension     Past Surgical History:  Procedure Laterality Date  . CESAREAN SECTION      Family History  Problem Relation Age of Onset  . Hypertension Father   . Cancer Maternal Grandmother   . Thyroid disease Paternal Uncle     Social History:  reports that she has never smoked. She has never used smokeless tobacco. She reports that she does not drink alcohol and does not use drugs.  Allergies: No Known Allergies  Medications: I have reviewed the patient's current medications.  Results for orders placed or performed during the hospital encounter of 08/26/20 (from the past 48 hour(s))  Protime-INR     Status: None   Collection Time: 08/26/20 10:05 PM  Result Value Ref Range   Prothrombin Time 13.2 11.4 - 15.2 seconds   INR 1.0 0.8 - 1.2    Comment: (NOTE) INR goal varies based on device and disease states. Performed at [facility], [address] [place], [place] [address]   APTT     Status: None   Collection Time: 08/26/20 10:05 PM  Result Value Ref Range   aPTT 28 24 - 36 seconds    Comment: Performed at [facility], [address] [place], [place] [address]  CBC     Status: Abnormal   Collection Time: 08/26/20 10:05 PM  Result Value Ref Range   WBC 8.3 4.0 - 10.5 K/uL   RBC 4.87 3.87 - 5.11 MIL/uL   Hemoglobin 11.7 (L) 12.0 - 15.0 g/dL   HCT 76.2 83.1 - 51.7 %    MCV 76.6 (L) 80.0 - 100.0 fL   MCH 24.0 (L) 26.0 - 34.0 pg   MCHC 31.4 30.0 - 36.0 g/dL   RDW 61.6 07.3 - 71.0 %   Platelets 304 150 - 400 K/uL   nRBC 0.0 0.0 - 0.2 %    Comment: Performed at [facility], [address] [place], [place] [address]  Differential     Status: Abnormal   Collection Time: 08/26/20 10:05 PM  Result Value Ref Range   Neutrophils Relative % 42 %   Neutro Abs 3.5 1.7 - 7.7 K/uL   Lymphocytes Relative 49 %   Lymphs Abs 4.2 (H) 0.7 - 4.0 K/uL   Monocytes Relative 7 %   Monocytes Absolute 0.6 0.1 - 1.0 K/uL   Eosinophils Relative 1 %   Eosinophils Absolute 0.1 0.0 - 0.5 K/uL   Basophils Relative 1 %   Basophils Absolute 0.0 0.0 - 0.1 K/uL   Immature Granulocytes 0 %   Abs Immature Granulocytes 0.02 0.00 - 0.07 K/uL    Comment: Performed at [facility], [address] [place], [place] [address]  Comprehensive metabolic panel     Status: Abnormal   Collection Time: 08/26/20 10:05 PM  Result Value Ref Range   Sodium 140  135 - 145 mmol/L   Potassium 3.2 (L) 3.5 - 5.1 mmol/L   Chloride 98 98 - 111 mmol/L   CO2 28 22 - 32 mmol/L   Glucose, Bld 131 (H) 70 - 99 mg/dL    Comment: Glucose reference range applies only to samples taken after fasting for at least 8 hours.   BUN 29 (H) 6 - 20 mg/dL   Creatinine, Ser 2.39 (H) 0.44 - 1.00 mg/dL   Calcium 9.8 8.9 - 53.2 mg/dL   Total Protein 8.0 6.5 - 8.1 g/dL   Albumin 4.5 3.5 - 5.0 g/dL   AST 18 15 - 41 U/L   ALT 13 0 - 44 U/L   Alkaline Phosphatase 66 38 - 126 U/L   Total Bilirubin 0.3 0.3 - 1.2 mg/dL   GFR, Estimated >02 >33 mL/min    Comment: (NOTE) Calculated using the CKD-EPI Creatinine Equation (2021)    Anion gap 14 5 - 15    Comment: Performed at [facility], [address] [place], [place] [address]  Troponin I (High Sensitivity)     Status: None   Collection Time: 08/26/20 10:08 PM  Result Value Ref Range   Troponin I (High Sensitivity) 4 <18 ng/L    Comment:  (NOTE) Elevated high sensitivity troponin I (hsTnI) values and significant  changes across serial measurements may suggest ACS but many other  chronic and acute conditions are known to elevate hsTnI results.  Refer to the "Links" section for chest pain algorithms and additional  guidance. Performed at [facility], [address] [place], [place] [address]   Ethanol     Status: None   Collection Time: 08/26/20 10:25 PM  Result Value Ref Range   Alcohol, Ethyl (B) <10 <10 mg/dL    Comment: (NOTE) Lowest detectable limit for serum alcohol is 10 mg/dL.  For medical purposes only. Performed at [facility], [address] [place], [place] [address]   CBG monitoring, ED     Status: Abnormal   Collection Time: 08/26/20 10:30 PM  Result Value Ref Range   Glucose-Capillary 126 (H) 70 - 99 mg/dL    Comment: Glucose reference range applies only to samples taken after fasting for at least 8 hours.  Resp Panel by RT-PCR (Flu A&B, Covid) Nasopharyngeal Swab     Status: None   Collection Time: 08/26/20 10:33 PM   Specimen: Nasopharyngeal Swab; Nasopharyngeal(NP) swabs in vial transport medium  Result Value Ref Range   SARS Coronavirus 2 by RT PCR NEGATIVE NEGATIVE    Comment: (NOTE) SARS-CoV-2 target nucleic acids are NOT DETECTED.  The SARS-CoV-2 RNA is generally detectable in upper respiratory specimens during the acute phase of infection. The lowest concentration of SARS-CoV-2 viral copies this assay can detect is 138 copies/mL. A negative result does not preclude SARS-Cov-2 infection and should not be used as the sole basis for treatment or other patient management decisions. A negative result may occur with  improper specimen collection/handling, submission of specimen other than nasopharyngeal swab, presence of viral mutation(s) within the areas targeted by this assay, and inadequate number of viral copies(<138 copies/mL). A negative result must be combined  with clinical observations, patient history, and epidemiological information. The expected result is Negative.  Fact Sheet for Patients:  BloggerCourse.com  Fact Sheet for Healthcare Providers:  SeriousBroker.it  This test is no t yet approved or cleared by the [place] FDA and  has been authorized for detection  and/or diagnosis of SARS-CoV-2 by FDA under an Emergency Use Authorization (EUA). This EUA will remain  in effect (meaning this test can be used) for the duration of the COVID-19 declaration under Section 564(b)(1) of the Act, 21 U.S.C.section 360bbb-3(b)(1), unless the authorization is terminated  or revoked sooner.       Influenza A by PCR NEGATIVE NEGATIVE   Influenza B by PCR NEGATIVE NEGATIVE    Comment: (NOTE) The Xpert Xpress SARS-CoV-2/FLU/RSV plus assay is intended as an aid in the diagnosis of influenza from Nasopharyngeal swab specimens and should not be used as a sole basis for treatment. Nasal washings and aspirates are unacceptable for Xpert Xpress SARS-CoV-2/FLU/RSV testing.  Fact Sheet for Patients: BloggerCourse.com  Fact Sheet for Healthcare Providers: SeriousBroker.it  This test is not yet approved or cleared by the [place] FDA and has been authorized for detection and/or diagnosis of SARS-CoV-2 by FDA under an Emergency Use Authorization (EUA). This EUA will remain in effect (meaning this test can be used) for the duration of the COVID-19 declaration under Section 564(b)(1) of the Act, 21 U.S.C. section 360bbb-3(b)(1), unless the authorization is terminated or revoked.  Performed at [facility], [address] [place], [place] [address]     CT HEAD CODE STROKE WO CONTRAST  Result Date: 08/26/2020 CLINICAL DATA:  Code stroke.  Left arm and leg numbness. EXAM: CT HEAD WITHOUT CONTRAST TECHNIQUE: Contiguous axial images  were obtained from the base of the skull through the vertex without intravenous contrast. COMPARISON:  05/05/2020 FINDINGS: Brain: Acute hemorrhage with the epicenter in the right thalamus. Measurement is approximately 3.2 x 2.1 x 2.5 cm (volume = 8.8 cm^3). Intraventricular penetration with blood filling the third and fourth ventricles and nearly filling the right lateral ventricle. No hydrocephalus. No extra-axial collection. Vascular: No primary vascular finding otherwise. Skull: Negative Sinuses/Orbits: Clear/normal Other: Extensive dental and periodontal disease ASPECTS (Alberta Stroke Program Early CT Score) - Ganglionic level infarction (caudate, lentiform nuclei, internal capsule, insula, M1-M3 cortex): 7 - Supraganglionic infarction (M4-M6 cortex): 3 Total score (0-10 with 10 being normal): 10 IMPRESSION: 1. Acute hemorrhage with the epicenter in the right thalamus. Measurement is approximately 3.2 x 2.1 x 2.5 cm (volume 8.8 cm^3). Intraventricular penetration with blood filling the third and fourth ventricles and nearly filling the right lateral ventricle. No hydrocephalus. 2. These results were called by telephone at the time of interpretation on 08/26/2020 at 10:35 pm to provider DAVID YAO , who verbally acknowledged these results. Electronically Signed   By: Paulina Fusi M.D.   On: 08/26/2020 22:36    Review of Systems  Unable to perform ROS: Mental status change  Neurological: Positive for facial asymmetry, speech difficulty and headaches.   Blood pressure (!) 148/75, pulse 63, temperature 98.1 F (36.7 C), temperature source Oral, resp. rate (!) 22, height 5\' 5"  (1.651 m), weight 61.2 kg, last menstrual period 03/03/2014, SpO2 100 %, unknown if currently breastfeeding. Physical Exam HENT:     Head: Normocephalic.     Right Ear: Tympanic membrane normal.     Left Ear: Tympanic membrane normal.     Nose: Nose normal.  Eyes:     Pupils: Pupils are equal, round, and reactive to light.   Cardiovascular:     Rate and Rhythm: Regular rhythm.  Pulmonary:     Effort: Pulmonary effort is normal.  Musculoskeletal:        General: Normal range of motion.  Skin:    General: Skin  is warm and dry.  Neurological:     Mental Status: She is lethargic.     Cranial Nerves: Cranial nerve deficit and facial asymmetry present.     Motor: Weakness present.     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  Will attempt ventricular catheter placement Have explained situation to husband  Coletta Memos 08/27/2020, 1:41 AM

## 2020-08-27 NOTE — H&P (Signed)
Neurology history and physical ICH  CC: Left-sided numbness and weakness, altered mental status  History is obtained from: Chart, patient's husband at bedside  HPI: Charlene [name] is a 55 y.o. female past medical history of hypertension with difficulty maintaining blood pressures and noncompliance to medications for various reasons, presented to the emergency room at [facility] for left-sided weakness and headaches.  She was last normal somewhere around 9:30 PM according to the husband but prior information provided to the telemedicine neurologist was last known well of 8 PM, when she was at her mother's house and called her husband complaining of some numbness on the left side.  When she came home after 10 minutes, she was unable to get out of the car with left-sided weakness and her speech was slurred.  She was also appearing more confused and lethargic.  She was [facility], seen by telemedicine evaluation by neurologist for concern for stroke and a noncontrast head CT showed a right thalamic/basal ganglia hemorrhage with intraventricular extension. Transferred ED to ED for inpatient neurological evaluation and admission. Husband reports that she has been diagnosed with hypertension over the last few months and was running out of medications intermittently and was in the process of finding a new primary care, the appointment was not until March but she ran out of medications.    LKW: 9:30 PM to 04/06/2021 tpa given?: no, ICH Premorbid modified Rankin scale (mRS): 0  ROS: Unable to perform due to her mentation  Past Medical History:  Diagnosis Date  . Hypertension     Family History  Problem Relation Age of Onset  . Hypertension Father   . Cancer Maternal Grandmother   . Thyroid disease Paternal Uncle      Social History:   reports that she has never smoked. She has never used smokeless tobacco. She reports that she  does not drink alcohol and does not use drugs.  Medications  Current Facility-Administered Medications:  .   stroke: mapping our early stages of recovery book, , Does not apply, Once, Milon Dikes, MD .  acetaminophen (TYLENOL) tablet 650 mg, 650 mg, Oral, Q4H PRN **OR** acetaminophen (TYLENOL) 160 MG/5ML solution 650 mg, 650 mg, Per Tube, Q4H PRN **OR** acetaminophen (TYLENOL) suppository 650 mg, 650 mg, Rectal, Q4H PRN, Milon Dikes, MD .  clevidipine (CLEVIPREX) infusion 0.5 mg/mL, 0-32 mg/hr, Intravenous, Continuous, Milon Dikes, MD, Last Rate: 20 mL/hr at 08/27/20 0040, 10 mg/hr at 08/27/20 0040 .  pantoprazole (PROTONIX) injection 40 mg, 40 mg, Intravenous, QHS, Milon Dikes, MD .  senna-docusate (Senokot-S) tablet 1 tablet, 1 tablet, Oral, BID, Milon Dikes, MD  Current Outpatient Medications:  .  carvedilol (COREG) 6.25 MG tablet, Take 1 tablet (6.25 mg total) by mouth 2 (two) times daily with a meal., Disp: 120 tablet, Rfl: 0 .  hydrochlorothiazide (HYDRODIURIL) 25 MG tablet, Take 1 tablet (25 mg total) by mouth daily., Disp: 60 tablet, Rfl: 0   Exam: Current vital signs: BP (!) 145/76   Pulse 72   Temp 98.1 F (36.7 C) (Oral)   Resp (!) 33   Ht 5\' 5"  (1.651 m)   Wt 61.2 kg   LMP 03/03/2014   SpO2 100%   BMI 22.45 kg/m  Vital signs in last 24 hours: Temp:  [98.1 F (36.7 C)] 98.1 F (36.7 C) (02/09 2203) Pulse Rate:  [72-127] 72 (02/10 0040) Resp:  [13-33] 33 (02/10 0040) BP: (134-198)/(67-168) 145/76 (02/10 0040) SpO2:  [  99 %-100 %] 100 % (02/10 0040) Weight:  [61.2 kg] 61.2 kg (02/09 2237) General: Extremely drowsy and lethargic, does not open eyes to voice or follow any commands. HEENT: Normocephalic atraumatic CVS: Regular rate rhythm Respiratory: Chest clear to auscultation, but has some upper airway rhonchorous sounds Extremities warm well perfused with no edema Abdomen nondistended nontender Neurological exam Extremely drowsy and lethargic, does not  open eyes to voice, does not follow commands. Drooling from the mouth Nonverbal Cranial nerves: Pupils are equal round reactive to light, both eyes are downward looking midline-setting sun sign, does not blink to threat from either side, facial symmetry difficult to ascertain but looks like she has left lower facial weakness with drooling from the left angle of the mouth. Motor examination: Increased tone in the left upper extremity with no withdrawal to noxious stimulation.  Spontaneous movement in the left lower extremity and withdrawal to noxious simulation.  Right upper and lower extremity appear full strength. Sensory exam: As above Coordination cannot be assessed due to her mentation 1a Level of Conscious.: 1 1b LOC Questions: 2 1c LOC Commands: 2 2 Best Gaze: 1 3 Visual: 0 4 Facial Palsy: 1 5a Motor Arm - left: 3 5b Motor Arm - Right: 0 6a Motor Leg - Left: 1 6b Motor Leg - Right: 0 7 Limb Ataxia: 0 8 Sensory: 0 9 Best Language: 3 10 Dysarthria: 2 11 Extinct. and Inatten.: 0 TOTAL: 16 GCS-E4, V1, M5 - 10  ICH score 2-with 30-day mortality of at least 26%.   Labs I have reviewed labs in epic and the results pertinent to this consultation are:  CBC    Component Value Date/Time   WBC 8.3 08/26/2020 2205   RBC 4.87 08/26/2020 2205   HGB 11.7 (L) 08/26/2020 2205   HCT 37.3 08/26/2020 2205   PLT 304 08/26/2020 2205   MCV 76.6 (L) 08/26/2020 2205   MCH 24.0 (L) 08/26/2020 2205   MCHC 31.4 08/26/2020 2205   RDW 14.9 08/26/2020 2205   LYMPHSABS 4.2 (H) 08/26/2020 2205   MONOABS 0.6 08/26/2020 2205   EOSABS 0.1 08/26/2020 2205   BASOSABS 0.0 08/26/2020 2205    CMP     Component Value Date/Time   NA 140 08/26/2020 2205   K 3.2 (L) 08/26/2020 2205   CL 98 08/26/2020 2205   CO2 28 08/26/2020 2205   GLUCOSE 131 (H) 08/26/2020 2205   BUN 29 (H) 08/26/2020 2205   CREATININE 1.09 (H) 08/26/2020 2205   CALCIUM 9.8 08/26/2020 2205   PROT 8.0 08/26/2020 2205   ALBUMIN  4.5 08/26/2020 2205   AST 18 08/26/2020 2205   ALT 13 08/26/2020 2205   ALKPHOS 66 08/26/2020 2205   BILITOT 0.3 08/26/2020 2205   GFRNONAA >60 08/26/2020 2205   GFRAA >90 02/21/2014 1218    Imaging I have reviewed the images obtained:  CT-scan of the brain-extremely motion riddled with right thalamic ICH extending into the intraventricular third and fourth ventricles and nearly filling the right lateral ventricle with no overt sign of hydrocephalus-this was done around 10:30 PM.   Assessment:  55 year old woman with past medical history of hypertension and possible noncompliance to medications presenting with sudden onset of headache, left-sided numbness and weakness progressing to complete paralysis of the left arm and depressed level of consciousness noted to have a right thalamic ICH extending into the ventricles-third and fourth ventricles and nearly filling the right lateral ventricle, on initial CT no overt signs of hydrocephalus but my examination  in the emergency room shows settings on sign concerning for increased ICP/early hydrocephalus. She is very agitated and moving quite a lot spontaneously hence not allowing for a good quality imaging to be obtained.  At this point, I suspect she might need intubation to protect her airway-she is currently a GCS of 10. ICH score 3  Impression: ICH -Likely hypertensive bleed in the setting of hypertensive emergency Intraventricular hemorrhage   Plan: Subcortical ICH, nontraumatic IVH, nontraumatic  Acuity: Acute Laterality: Right thalamus Current suspected etiology: Hypertension Treatment: -Admit to neurological ICU -ICH Score: 2 -ICH Volume: 9 -BP control goal SYS<140 -Clinically concerning for developing hydrocephalus.  Stat neurosurgical consultation-ER getting in touch with neurosurgery. -PT/OT/ST  -neuromonitoring  CNS Cerebral edema Compression of brain -At this time, I think an EVD would be more helpful than  hypertonic therapy because the clinical picture looks more of developing hydrocephalus due to intraventricular extension of the blood -Close neuro monitoring  Clinical suspicion for obstructive hydrocephalus -Neurosurgery consult -Attempt to get a CTA head and neck if possible, if not just a CT head should be repeated -Might need to be intubated before that.  Dysarthria Dysphagia following ICH  -NPO until cleared by speech -ST  Hypertensive encephalopathy/hypertensive emergency- -blood pressure goal-systolic blood pressure less than 140-use Cleviprex drip.  Hemiplegia and hemiparesis following nontraumatic intracerebral hemorrhage affecting left non-dominant side  -Continue PT/OT/ST  RESP Currently protecting her airway but not of secretions that she looks like is not able to handle-likely acute Respiratory Failure in the setting of ICH and hydrocephalus -ER on board-discussing intubation with family. -If intubated, vent management per CCM  CV Hypertensive Encephalopathy Essential (primary) hypertension Hypertensive Emergency -Aggressive BP control, goal SBP <140-meds as above  -TTE  GI/GU No active issues Gentle hydration Check labs in the morning  HEME Likely iron deficiency anemia Monitor labs in the morning Transfuse for any hemoglobin less than 7 No evidence of coagulopathy-PT/INR normal.  ENDO SSI  Fluid/Electrolyte Disorders Check labs in the morning Replete as necessary  ID Possible Aspiration PNA -CXR -NPO -Monitor  Prophylaxis DVT: SCDs-no antiplatelets or anticoagulants due to ICH GI: PPI Bowel: Docusate senna  Dispo: To be determined  Diet: NPO until cleared by speech  Code Status: Full Code   Consults: Neurosurgery for EVD  Discussed my plan in detail with Dr. Kennis Carina, EDP Preliminary plan discussed with Dr. Silverio Lay over the phone  THE FOLLOWING WERE PRESENT ON ADMISSION: ICH, hemiplegia/hemiparesis, cerebral edema, obstructive  hydrocephalus, possible aspiration pneumonia, hypertensive emergency, hypertensive encephalopathy  -- Milon Dikes, MD Neurologist [facility] Pager: [phone]  CRITICAL CARE ATTESTATION Performed by: Milon Dikes, MD Total critical care time: 60 minutes Critical care time was exclusive of separately billable procedures and treating other patients and/or supervising APPs/Residents/Students Critical care was necessary to treat or prevent imminent or life-threatening deterioration due to ICH, hypertensive emergency, hypertensive encephalopathy This patient is critically ill and at significant risk for neurological worsening and/or death and care requires constant monitoring. Critical care was time spent personally by me on the following activities: development of treatment plan with patient and/or surrogate as well as nursing, discussions with consultants, evaluation of patient's response to treatment, examination of patient, obtaining history from patient or surrogate, ordering and performing treatments and interventions, ordering and review of laboratory studies, ordering and review of radiographic studies, pulse oximetry, re-evaluation of patient's condition, participation in multidisciplinary rounds and medical decision making of high complexity in the care of this patient.

## 2020-08-27 NOTE — Op Note (Signed)
 *   No surgery found *  PATIENT:   Charlene [name]  55 y.o. female with intraventricular blood. Given recent neurological changes I felt placing a ventricular catheter in the penumbra of a blown pupil was appropriate.   PRE-OPERATIVE DIAGNOSIS: Intraventricular hemmorhage  POST-OPERATIVE DIAGNOSIS:  Same  PROCEDURE:  Right frontal ventricular catheter placement  SURGEON:  Darnice Comrie ANESTHESIA:   local DRAINS: Ventriculostomy Drain in the right lateral ventricle.   SPECIMEN: none  DICTATION: [name] had female  head shaved and then prepared in a sterile manner. I draped female  in a sterile manner. I injected lidocaine into the planned coronal incision in line with the right pupillary line anterior to the tragus. I opened the skin with a 15 blade. I used the hand twist drill to create a burr hole. I opened the dura with the 20 gauge spinal needle. I passed the ventricular catheter into the lateral ventricle. There was brisk flow of spinal fluid. I tunneled the catheter posteriorly and secured it to the skin at the exit. I approximated the scalp edges with nylon sutures. I placed a sterile dressing, then connected the catheter to the drainage system.   PLAN OF CARE: Admit to inpatient   PATIENT DISPOSITION:  PACU - hemodynamically stable.

## 2020-08-27 NOTE — Consult Note (Addendum)
NAME:  Charlene [name], MRN:  [id number], DOB:  1966-02-17, LOS: 0 ADMISSION DATE:  08/26/2020, CONSULTATION DATE:  2/10 REFERRING MD:  Dr. 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  On 2/10 she had acute mental status change to obtundation and respiratory arrest resulting in intubation.  History of Present Illness:  55 year old female with past medical history as below, which is significant for hypertension and medication noncompliance.  She presented to the emergency department at med center [facility] on 2/9 with complaints of headache and left-sided weakness.  She had stat CT of the head which demonstrated right thalamic/basal ganglia hemorrhage with intraventricular extension.  She was transferred to [facility] emergency department for in person neurology evaluation. There was evidence of obstructive hydrocephalus and neurosurgery was consulted. External ventricular drain was placed 2/10. In the hours following she became increasingly obtunded. PCCM was consulted. Upon PCCM arrival the patient became apneic.  Past Medical History:   has a past medical history of Hypertension.   Significant Hospital Events:  2/9 adamit for ICH 2/10 EVD placed, later intubated due to obtundation and respiratory arrest.   Consults:  Stroke PCCM Neurosurgery  Procedures:  ETT 2/10 EVD 2/10  Significant Diagnostic Tests:  CT head 2/9 > Acute hemorrhage with the epicenter in the right thalamus. Measurement is approximately 3.2 x 2.1 x 2.5 cm (volume 8.8 cm^3). Intraventricular penetration with blood filling the third and fourth ventricles and nearly filling the right lateral ventricle. No hydrocephalus. CTA head/neck 2/10 > Unchanged size of intraparenchymal hemorrhage centered in the right thalamus and basal ganglia with extension into the right lateral ventricle. 2. No intracranial arterial occlusion or high-grade stenosis. No dissection,  aneurysm or hemodynamically significant stenosis of the carotid or vertebral arteries. CT head 2/10 > Stable parenchymal hemorrhage in the right thalamus and basal ganglia with intraventricular extension as described. No new focal abnormality is noted. CT head 2/10 (post intubation) >>>  Micro Data:  PCR neg  Antimicrobials:     Interim History / Subjective:    Objective   Blood pressure (!) 69/45, pulse (!) 124, temperature 98.6 F (37 C), temperature source Axillary, resp. rate 20, height 5\' 5"  (1.651 m), weight 61.2 kg, last menstrual period 03/03/2014, SpO2 100 %, unknown if currently breastfeeding.    FiO2 (%):  [100 %] 100 %   Intake/Output Summary (Last 24 hours) at 08/27/2020 1845 Last data filed at 08/27/2020 1700 Gross per 24 hour  Intake 1160.48 ml  Output 552 ml  Net 608.48 ml   Filed Weights   08/26/20 2237  Weight: 61.2 kg    Examination: General: Thin middle aged female HENT: EVD in place. R pupil dilated and fixed.  Lungs: Clear bilateral breath sounds Cardiovascular: RRR, no MRG Abdomen: Soft, non-tender, non-distended Extremities: No acute deformity  Neuro: coma  Resolved Hospital Problem list     Assessment & Plan:   Intra-parenchymal hemorrhage: R thalamic IPH with extension into the third, forth, and R lateral ventricle.  Obstructive hydrocephalus Hypertensive emergency - STAT CT head now for new obtundation - Management per stoke service and neurosurgery.  - Keep SBP < 160 mmHg, cleviprex - Repeat labs  Acute hypoxemic respiratory failure secondary to above - Full vent support - VAP bundle - Propofol for RASS goal -1 to -2 - CXR and ABG   Best practice (evaluated daily)  Diet: NPO Pain/Anxiety/Delirium protocol (if indicated): Prop VAP protocol (if indicated): Yes  DVT prophylaxis: No chemoprophylaxis  GI prophylaxis: PPI Glucose control: SSI Mobility: BR Disposition: ICU  Goals of Care:  Last date of multidisciplinary goals of  care discussion: per primary Family and staff present:  Summary of discussion:  Follow up goals of care discussion due:  Code Status: FULL  Labs   CBC: Recent Labs  Lab 08/26/20 2205  WBC 8.3  NEUTROABS 3.5  HGB 11.7*  HCT 37.3  MCV 76.6*  PLT 304    Basic Metabolic Panel: Recent Labs  Lab 08/26/20 2205  NA 140  K 3.2*  CL 98  CO2 28  GLUCOSE 131*  BUN 29*  CREATININE 1.09*  CALCIUM 9.8   GFR: Estimated Creatinine Clearance: 53.7 mL/min (A) (by C-G formula based on SCr of 1.09 mg/dL (H)). Recent Labs  Lab 08/26/20 2205  WBC 8.3    Liver Function Tests: Recent Labs  Lab 08/26/20 2205  AST 18  ALT 13  ALKPHOS 66  BILITOT 0.3  PROT 8.0  ALBUMIN 4.5   No results for input(s): LIPASE, AMYLASE in the last 168 hours. No results for input(s): AMMONIA in the last 168 hours.  ABG    Component Value Date/Time   TCO2 27 03/30/2009 2148     Coagulation Profile: Recent Labs  Lab 08/26/20 2205  INR 1.0    Cardiac Enzymes: No results for input(s): CKTOTAL, CKMB, CKMBINDEX, TROPONINI in the last 168 hours.  HbA1C: Hgb A1c MFr Bld  Date/Time Value Ref Range Status  08/27/2020 02:51 AM 6.0 (H) 4.8 - 5.6 % Final    Comment:    (NOTE) Pre diabetes:          5.7%-6.4%  Diabetes:              >6.4%  Glycemic control for   <7.0% adults with diabetes     CBG: Recent Labs  Lab 08/26/20 2230 08/27/20 1538  GLUCAP 126* 185*    Review of Systems:   Patient is encephalopathic and/or intubated. Therefore history has been obtained from chart review.    Past Medical History:  She,  has a past medical history of Hypertension.   Surgical History:   Past Surgical History:  Procedure Laterality Date  . CESAREAN SECTION       Social History:   reports that she has never smoked. She has never used smokeless tobacco. She reports that she does not drink alcohol and does not use drugs.   Family History:  Her family history includes Cancer in her  maternal grandmother; Hypertension in her father; Thyroid disease in her paternal uncle.   Allergies No Known Allergies   Home Medications  Prior to Admission medications   Medication Sig Start Date End Date Taking? Authorizing Provider  acetaminophen (TYLENOL) 500 MG tablet Take 1,000 mg by mouth every 6 (six) hours as needed for mild pain.   Yes [provider]  carvedilol (COREG) 6.25 MG tablet Take 1 tablet (6.25 mg total) by mouth 2 (two) times daily with a meal. 08/19/20  Yes Benjiman Core, MD  hydrochlorothiazide (HYDRODIURIL) 25 MG tablet Take 1 tablet (25 mg total) by mouth daily. 08/19/20 10/18/20 Yes Benjiman Core, MD  metoprolol tartrate (LOPRESSOR) 25 MG tablet Take 50 mg by mouth daily. 03/10/14 06/03/20  Doris Cheadle, MD     Critical care time: 40 minutes     Joneen Roach, AGACNP-BC [facility]  See Amion for personal pager PCCM on call pager [phone] until 7pm. Please call  [facility]link 7p-7a. [phone]  08/27/2020 7:06 PM

## 2020-08-27 NOTE — Progress Notes (Signed)
PT Cancellation Note  Patient Details Name: Charlene [name] MRN: [id number] DOB: Nov 20, 1965   Cancelled Treatment:    Reason Eval/Treat Not Completed: Medical issues which prohibited therapy this morning. Per RN, the pt is awaiting EVD placement, and asks for PT to hold until more medically stable after procedure. PT will continue to follow and evaluate as appropriate.   Deland Pretty, DPT   Acute Rehabilitation Department Pager #: [phone]   Gaetana Michaelis 08/27/2020, 12:34 PM

## 2020-08-27 NOTE — Progress Notes (Signed)
Patient ID: KAORI JUMPER, female   DOB: 12-Jun-1966, 55 y.o.   MRN: [id number] BP 121/63   Pulse 89   Temp 99.5 F (37.5 C) (Oral) Comment: Tylenol given  Resp 17   Ht 5\' 5"  (1.651 m)   Wt 61.2 kg   LMP 03/03/2014   SpO2 100%   BMI 22.45 kg/m  I was called due to a neurological change, that being the right pupil is no longer reactive. I instructed the nurse to inform her husband whom I have spoken with multiple times this morning, to tell him I would place a ventricular catheter to gain more information about her status. Mr. Bowersox is quite anxious and as of know cannot reach a level of comfort allowing him to make a decision about allowing me to place the catheter. I believe the catheter is important and vital at this time. I am not sure that it is a life saving procedure but I am firm in my belief that it be placed if possible. I am now waiting for an individual that Mr. Carbary will contact to help him in making a decision.

## 2020-08-27 NOTE — Progress Notes (Signed)
RSI kit obtained from [employer] delivered to this room.

## 2020-08-27 NOTE — Progress Notes (Signed)
Patient ID: Charlene [name], female   DOB: [birth year], 55 y.o.   MRN: [id number] BP 121/63   Pulse 89   Temp 99.5 F (37.5 C) (Oral) Comment: Tylenol given  Resp 17   Ht 5\' 5"  (1.651 m)   Wt 61.2 kg   LMP 03/03/2014   SpO2 100%   BMI 22.45 kg/m  Catheter placed after being sedated. Pressure was not refractory, will open drain and monitor pressure.

## 2020-08-27 NOTE — Progress Notes (Signed)
SLP Cancellation Note  Patient Details Name: Charlene [name] MRN: [id number] DOB: January 08, 1966   Cancelled evaluation: Order received for speech/language assessment.  Pt not yet alert enough for participation; not ready for [facility] swallow screen.  SLP will follow along for readiness. Remus Hagedorn L. Samson [name], MA CCC/SLP Acute Rehabilitation Services Office number [phone] Pager [phone]           [name] [name] 08/27/2020, 9:06 AM

## 2020-08-28 ENCOUNTER — Inpatient Hospital Stay (HOSPITAL_COMMUNITY): Payer: Medicaid Other

## 2020-08-28 ENCOUNTER — Inpatient Hospital Stay: Payer: Self-pay

## 2020-08-28 DIAGNOSIS — I6389 Other cerebral infarction: Secondary | ICD-10-CM

## 2020-08-28 LAB — GLUCOSE, CAPILLARY
Glucose-Capillary: 101 mg/dL — ABNORMAL HIGH (ref 70–99)
Glucose-Capillary: 109 mg/dL — ABNORMAL HIGH (ref 70–99)
Glucose-Capillary: 106 mg/dL — ABNORMAL HIGH (ref 70–99)
Glucose-Capillary: 85 mg/dL (ref 70–99)
Glucose-Capillary: 116 mg/dL — ABNORMAL HIGH (ref 70–99)
Glucose-Capillary: 118 mg/dL — ABNORMAL HIGH (ref 70–99)

## 2020-08-28 LAB — MAGNESIUM
Magnesium: 1.7 mg/dL (ref 1.7–2.4)
Magnesium: 1.9 mg/dL (ref 1.7–2.4)

## 2020-08-28 LAB — BASIC METABOLIC PANEL
Anion gap: 11 (ref 5–15)
Anion gap: 9 (ref 5–15)
BUN: 28 mg/dL — ABNORMAL HIGH (ref 6–20)
BUN: 25 mg/dL — ABNORMAL HIGH (ref 6–20)
CO2: 24 mmol/L (ref 22–32)
CO2: 23 mmol/L (ref 22–32)
Calcium: 8.3 mg/dL — ABNORMAL LOW (ref 8.9–10.3)
Calcium: 8.9 mg/dL (ref 8.9–10.3)
Chloride: 111 mmol/L (ref 98–111)
Chloride: 123 mmol/L — ABNORMAL HIGH (ref 98–111)
Creatinine, Ser: 1.94 mg/dL — ABNORMAL HIGH (ref 0.44–1.00)
Creatinine, Ser: 1.28 mg/dL — ABNORMAL HIGH (ref 0.44–1.00)
GFR, Estimated: 30 mL/min — ABNORMAL LOW (ref >60)
GFR, Estimated: 50 mL/min — ABNORMAL LOW (ref >60)
Glucose, Bld: 109 mg/dL — ABNORMAL HIGH (ref 70–99)
Glucose, Bld: 111 mg/dL — ABNORMAL HIGH (ref 70–99)
Potassium: 2.7 mmol/L — CL (ref 3.5–5.1)
Potassium: 3.8 mmol/L (ref 3.5–5.1)
Sodium: 146 mmol/L — ABNORMAL HIGH (ref 135–145)
Sodium: 155 mmol/L — ABNORMAL HIGH (ref 135–145)

## 2020-08-28 LAB — TRIGLYCERIDES: Triglycerides: 24 mg/dL (ref <150)

## 2020-08-28 LAB — CBC
HCT: 28 % — ABNORMAL LOW (ref 36.0–46.0)
Hemoglobin: 9 g/dL — ABNORMAL LOW (ref 12.0–15.0)
MCH: 24.6 pg — ABNORMAL LOW (ref 26.0–34.0)
MCHC: 32.1 g/dL (ref 30.0–36.0)
MCV: 76.5 fL — ABNORMAL LOW (ref 80.0–100.0)
Platelets: 197 10*3/uL (ref 150–400)
RBC: 3.66 MIL/uL — ABNORMAL LOW (ref 3.87–5.11)
RDW: 15.2 % (ref 11.5–15.5)
WBC: 6.4 10*3/uL (ref 4.0–10.5)
nRBC: 0 % (ref 0.0–0.2)

## 2020-08-28 LAB — PHOSPHORUS
Phosphorus: 3.1 mg/dL (ref 2.5–4.6)
Phosphorus: 2.4 mg/dL — ABNORMAL LOW (ref 2.5–4.6)

## 2020-08-28 LAB — SODIUM
Sodium: 151 mmol/L — ABNORMAL HIGH (ref 135–145)
Sodium: 156 mmol/L — ABNORMAL HIGH (ref 135–145)

## 2020-08-28 IMAGING — DX DG CHEST 1V PORT
1 series · 1 of 1 positions shown · non-contrast
Comparison: [DATE]

CLINICAL DATA: Hypoxia

EXAM:
PORTABLE CHEST 1 VIEW

[chest ap]
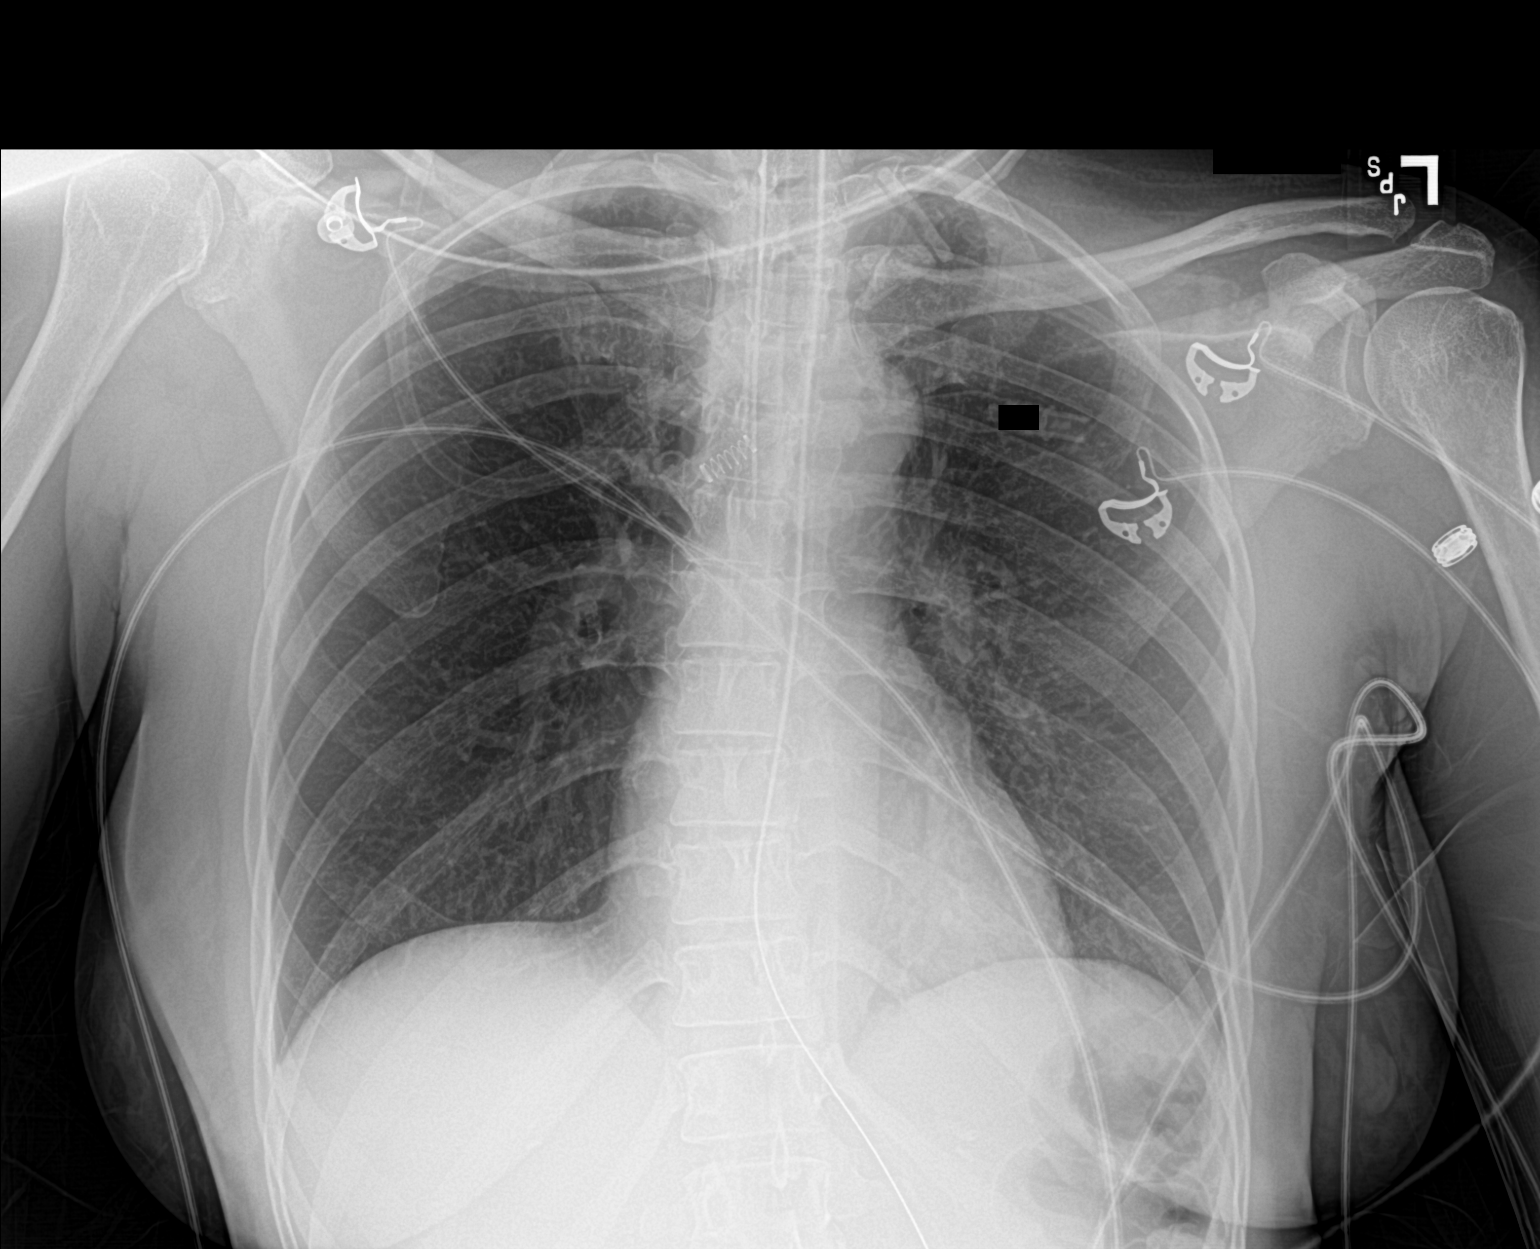

[1 of 1 positions shown; findings below may reference images not displayed]

FINDINGS: Endotracheal tube tip is at the carina currently. Nasogastric tube
tip and side port are below the diaphragm. No pneumothorax. Lungs
are clear. Heart size and pulmonary vascularity are normal. No
adenopathy. No bone lesions.
IMPRESSION: Endotracheal tube tip is at the carina. Advise withdrawing
endotracheal tube 2.5-3 cm. Nasogastric tube tip and side port below
diaphragm. Lungs clear. Heart size normal.

These results will be called to the ordering clinician or
representative by the Radiologist Assistant, and communication
documented in the PACS or [REDACTED].

## 2020-08-28 IMAGING — MR MR MRA HEAD W/O CM
1 series · 25 of 48 positions shown · non-contrast
Comparison: MRI same day

CLINICAL DATA: Right thalamic and basal ganglia hemorrhage

EXAM:
MRA HEAD WITHOUT CONTRAST
TECHNIQUE: Angiographic images of the Circle of Willis were obtained using MRA
technique without intravenous contrast.

[Series 5: 3d cow · axial · 0.5mm · 0.41mm/px · z∈[-133,-50]mm · 25 of 188 slices shown]
[im 1/188]
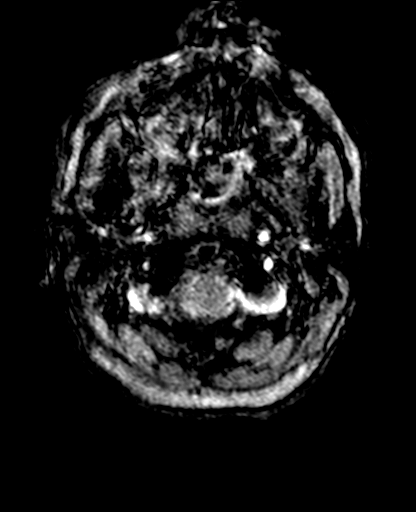
[im 4/188]
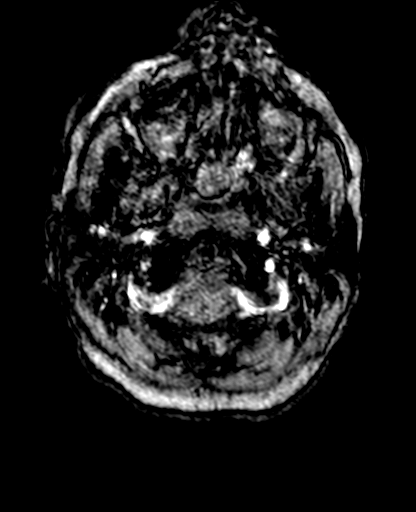
[im 8/188]
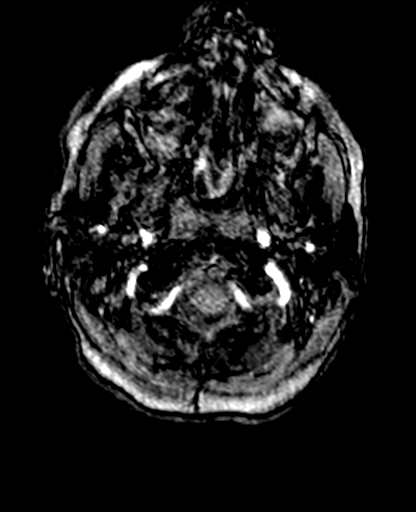
[im 12/188]
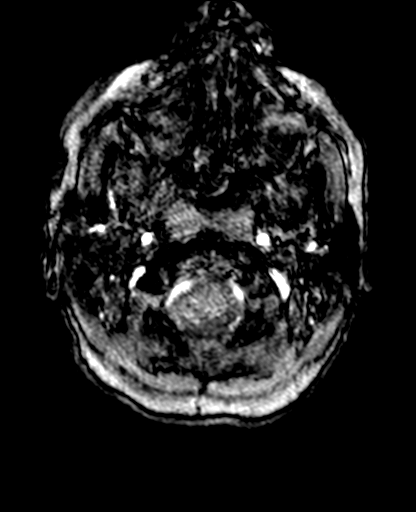
[im 16/188]
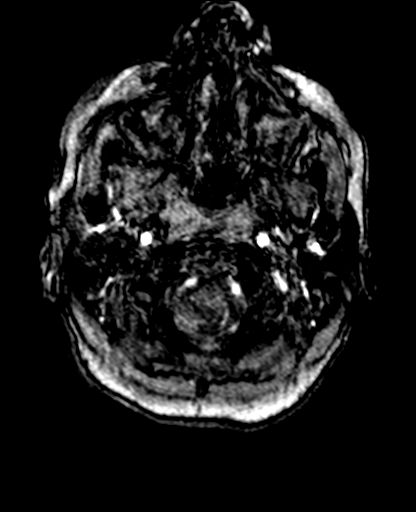
[im 20/188]
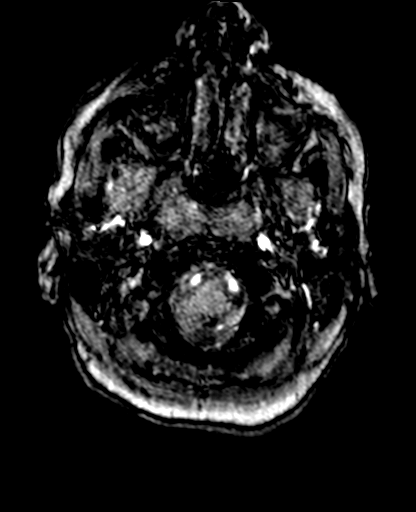
[im 24/188]
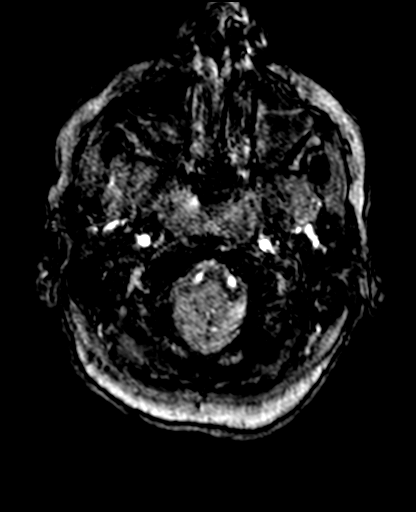
[im 28/188]
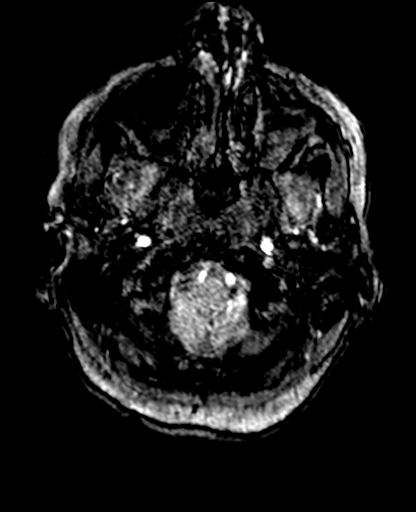
[im 32/188]
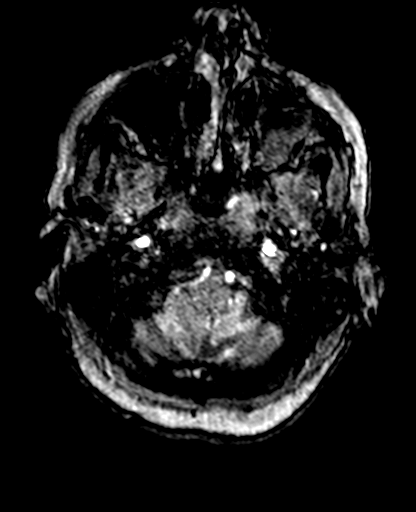
[im 36/188]
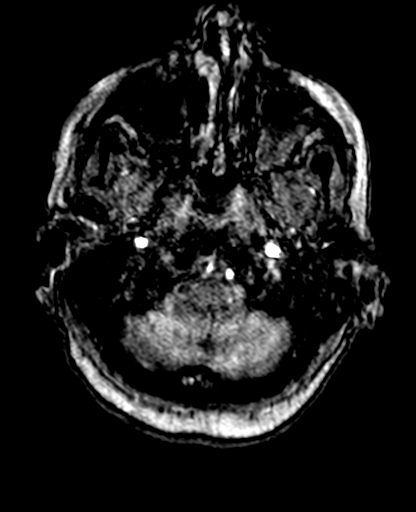
[im 40/188]
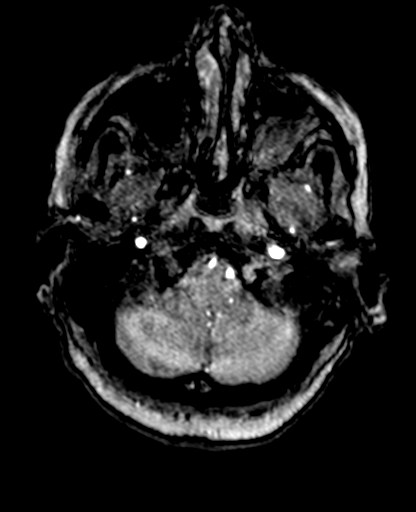
[im 44/188]
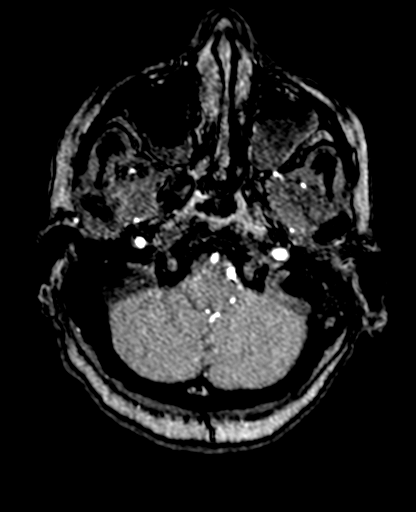
[im 48/188]
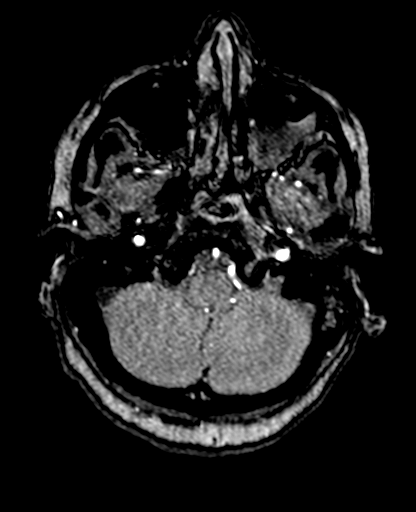
[im 52/188]
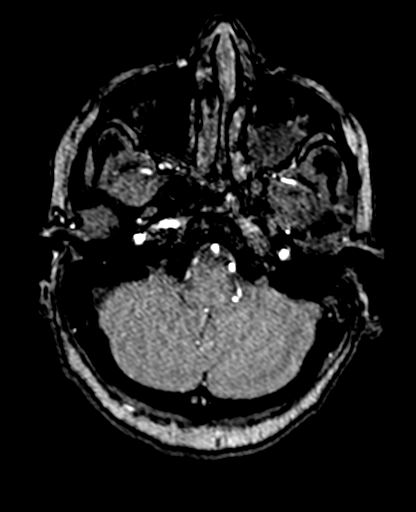
[im 56/188]
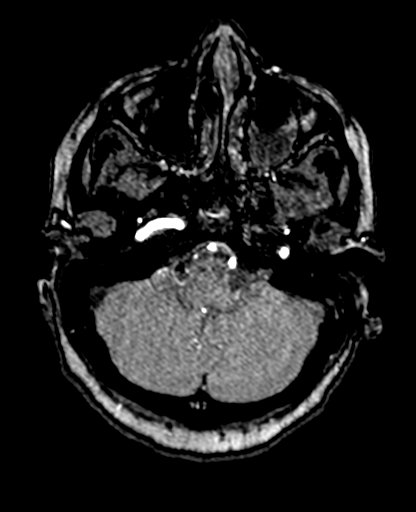
[im 60/188]
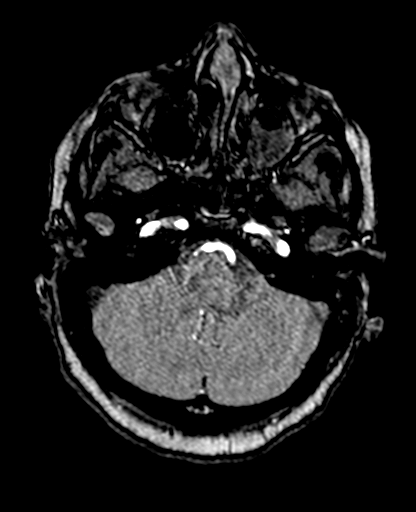
[im 64/188]
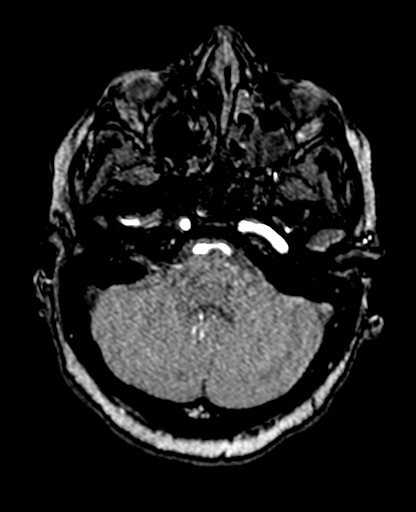
[im 68/188]
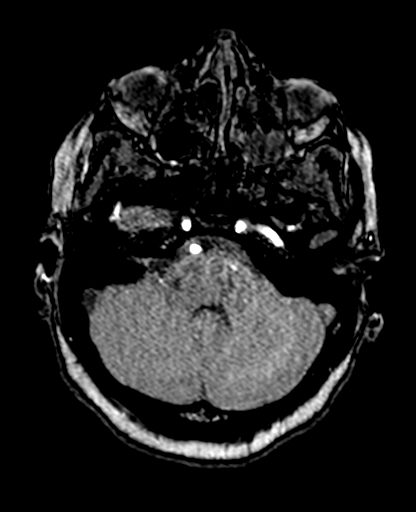
[im 84/188]
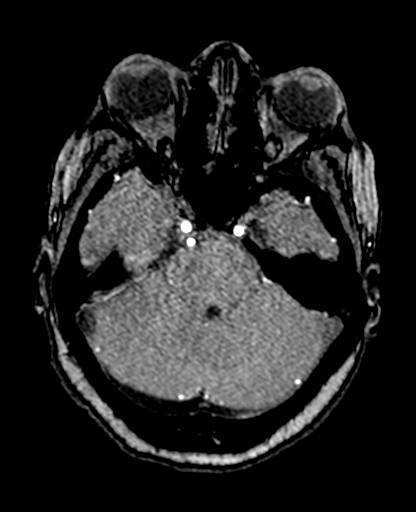
[im 96/188]
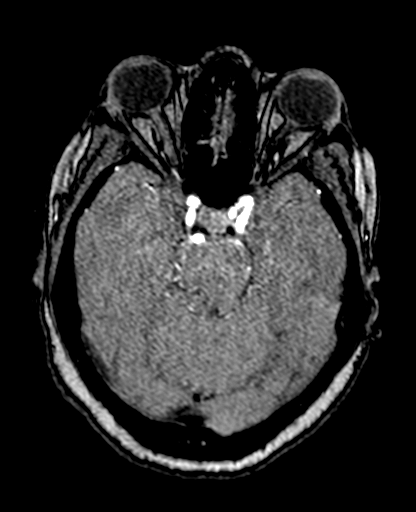
[im 108/188]
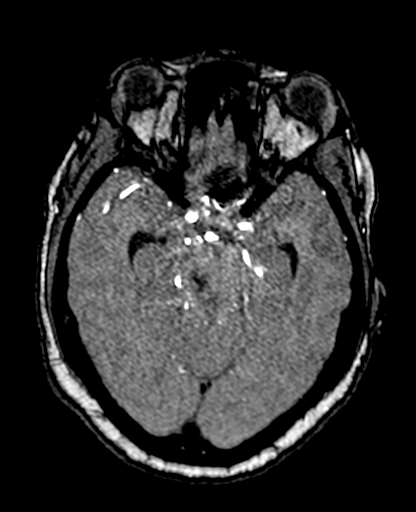
[im 132/188]
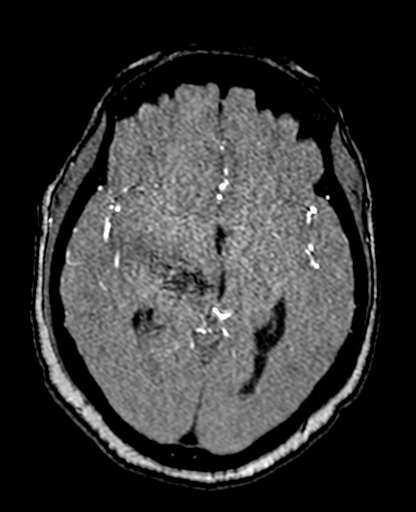
[im 156/188]
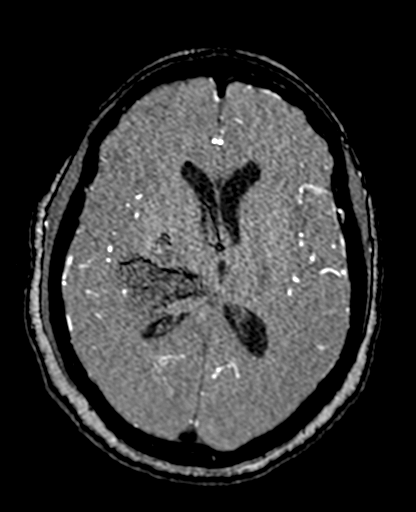
[im 160/188]
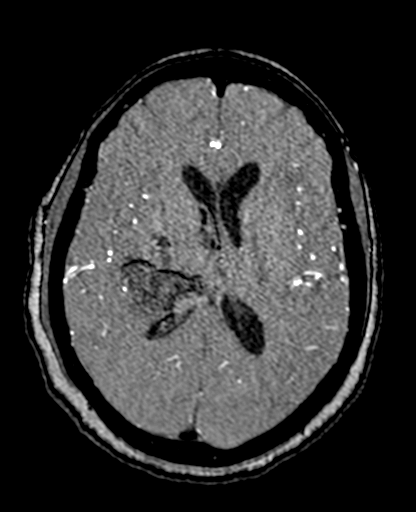
[im 180/188]
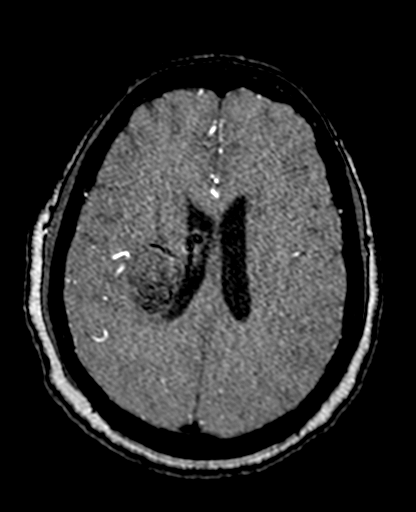

[25 of 48 positions shown; findings below may reference images not displayed]

FINDINGS: Both vertebral arteries widely patent through the foramen magnum to
the basilar artery. Basilar artery is widely patent. Superior
cerebellar and posterior cerebral arteries are patent. No evidence
of aneurysm or high flow vascular malformation to explain the
intraparenchymal hemorrhage.

Both internal carotid arteries are patent through the skull base and
siphon regions. The anterior and middle cerebral vessels appear
patent and normal. No aneurysm or vascular malformation.
IMPRESSION: Negative intracranial MR angiography. No evidence of aneurysm or
high flow vascular malformation to explain the intraparenchymal
hemorrhage.

## 2020-08-28 IMAGING — MR MR HEAD W/O CM
7 of 10 series · 30 of 48 positions shown · non-contrast
Comparison: Head CT from [DATE].
COMPARISON: Head CT from [DATE].

Addendum:
CLINICAL DATA: Intracranial hemorrhage.

EXAM:
MRI HEAD WITHOUT CONTRAST
TECHNIQUE: Multiplanar, multiecho pulse sequences of the brain and surrounding
structures were obtained without intravenous contrast.

[Series 3: DWI · axial · 3.0mm · 1.09mm/px · z∈[-43,+92]mm · 9 of 96 slices shown (1 of 4)]
[im 1/96]
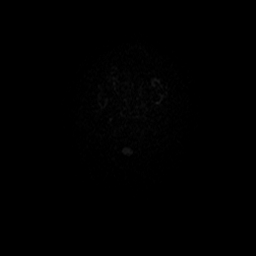
[im 12/96]
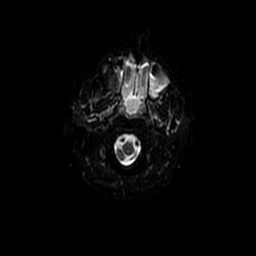
[im 24/96]
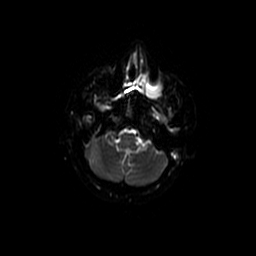
[im 36/96]
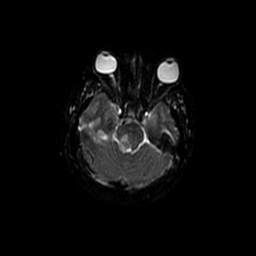
[im 48/96]
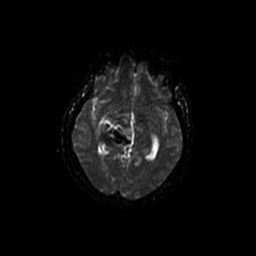
[im 60/96]
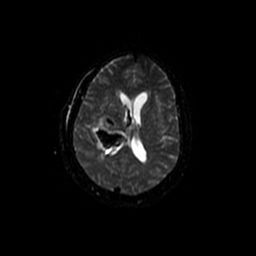
[im 72/96]
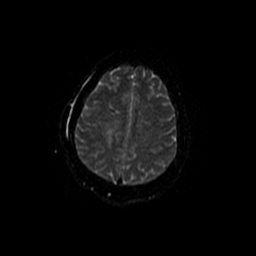
[im 84/96]
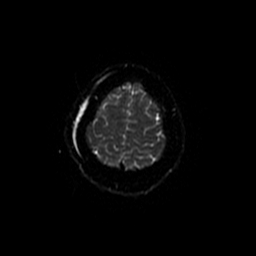
[im 96/96]
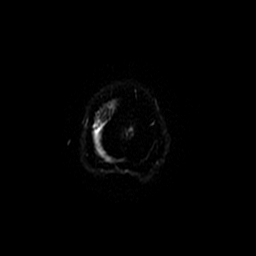

[Series 4: DWI · coronal · 5.0mm · 1.09mm/px · 6 of 66 slices shown (2 of 4)]
[im 1/66]
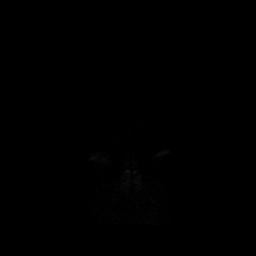
[im 14/66]
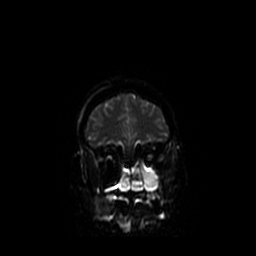
[im 27/66]
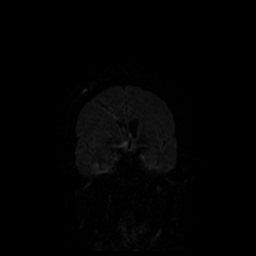
[im 40/66]
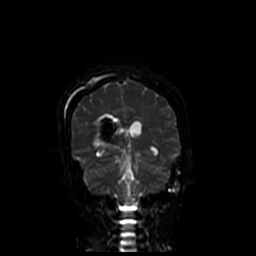
[im 53/66]
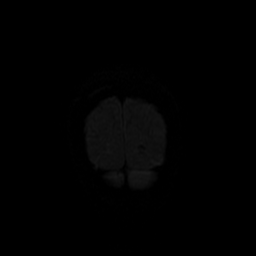
[im 66/66]
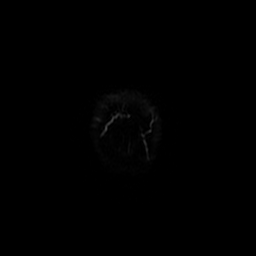

[Series 6: T2 · axial · 5.0mm · 0.45mm/px · z∈[-50,+87]mm · 3 of 26 slices shown]
[im 1/26]
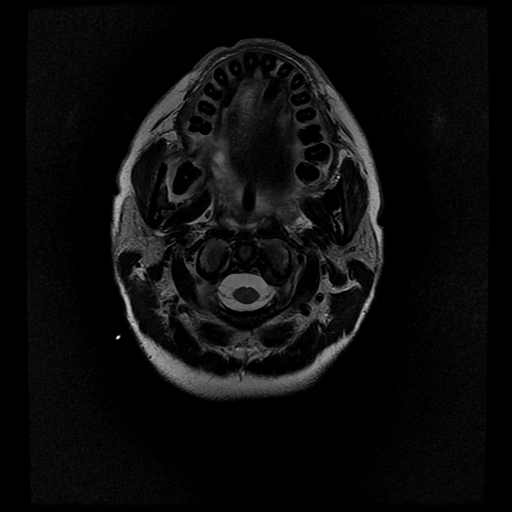
[im 13/26]
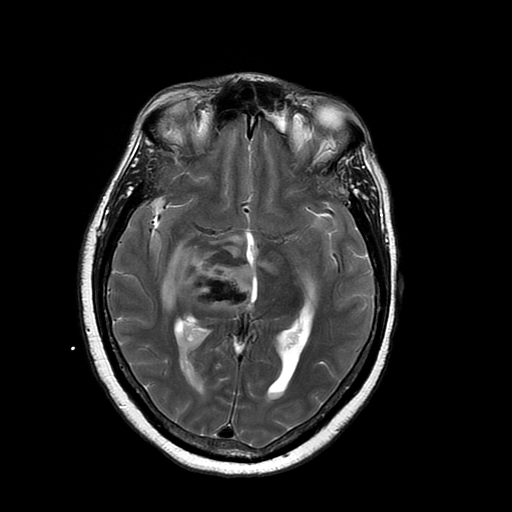
[im 26/26]
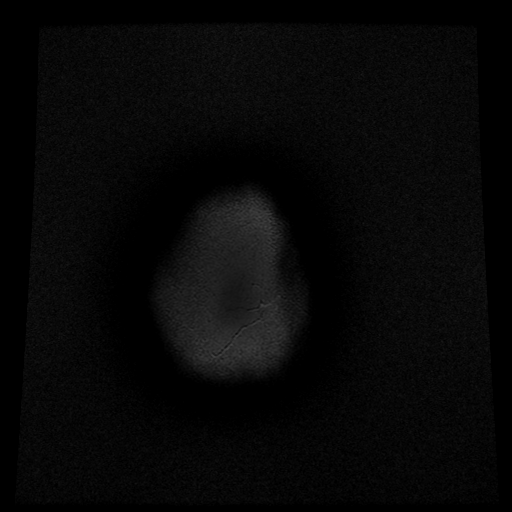

[Series 7: FLAIR · axial · 5.0mm · 0.45mm/px · z∈[-50,+87]mm · 3 of 26 slices shown]
[im 1/26]
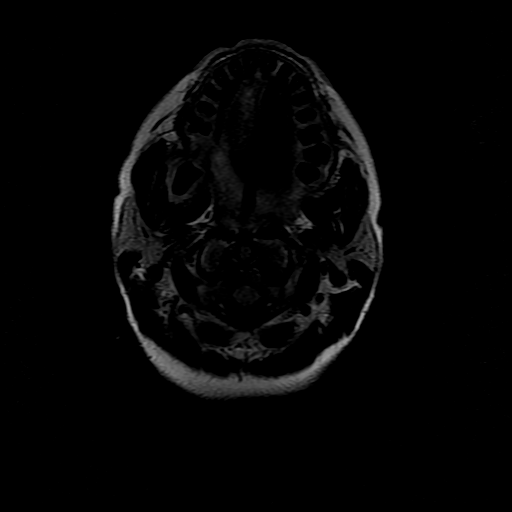
[im 13/26]
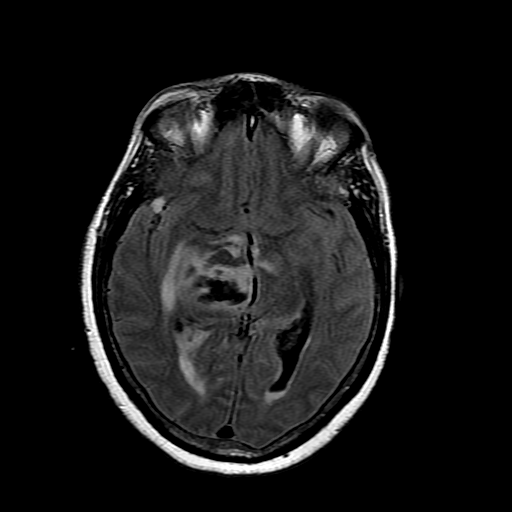
[im 26/26]
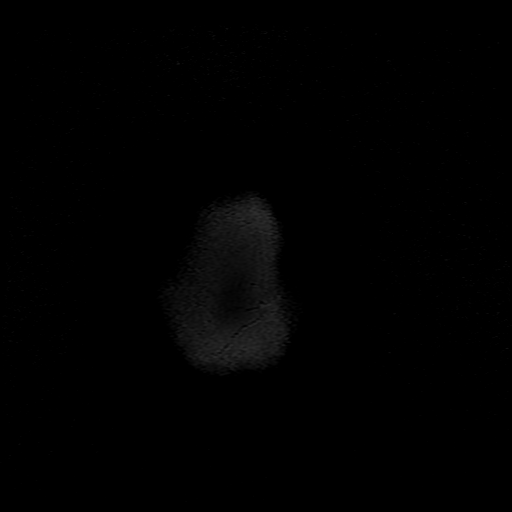

[Series 10: T2 post-contrast · coronal · 5.0mm · 0.39mm/px · 1 of 25 slices shown]
[im 1/25]
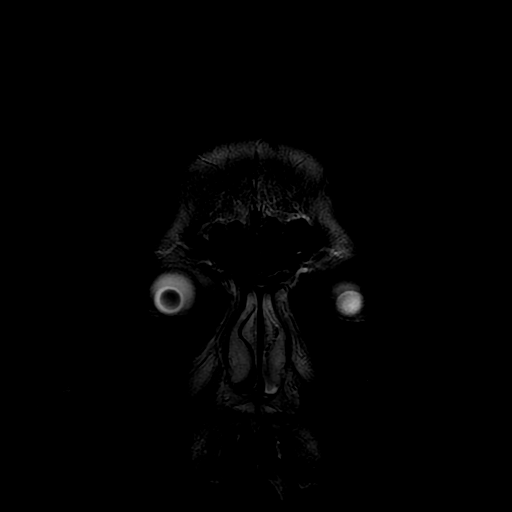

[Series 300: DWI · axial · 3.0mm · 1.09mm/px · z∈[-43,+92]mm · 5 of 48 slices shown (3 of 4)]
[im 1/48]
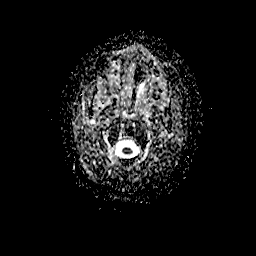
[im 12/48]
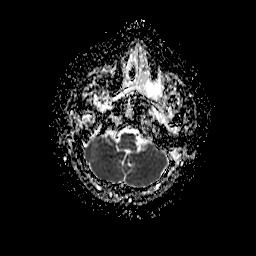
[im 24/48]
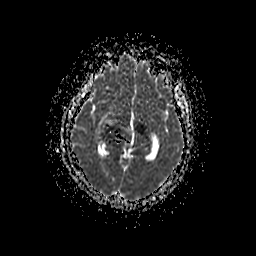
[im 36/48]
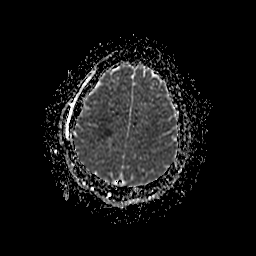
[im 48/48]
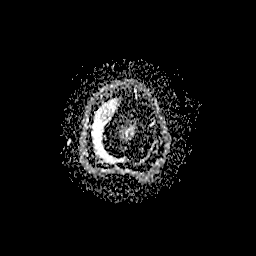

[Series 400: DWI · coronal · 5.0mm · 1.09mm/px · 3 of 33 slices shown (4 of 4)]
[im 1/33]
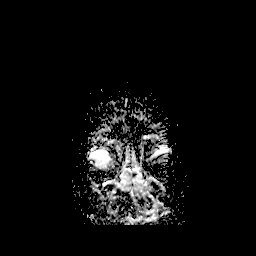
[im 17/33]
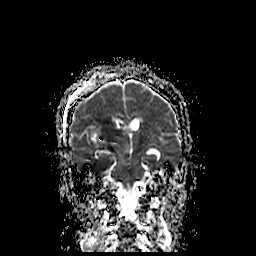
[im 33/33]
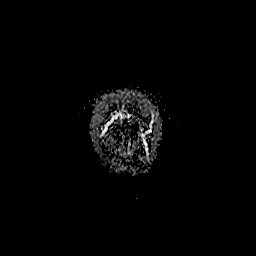

[30 of 48 positions shown; findings below may reference images not displayed]

FINDINGS: Brain: Redemonstrated large intraparenchymal hemorrhage centered in
the right thalamus and basal ganglia with extension inferiorly into
the midbrain as well as intraventricular extension of hemorrhage
into the lateral ventricles and fourth ventricle. When comparing
across modalities, similar size of the hemorrhage and similar volume
of intraventricular blood. There is approximately 4 mm of leftward
midline shift at the foramina Monroe, similar to prior. There is
effacement of the basal cisterns, compatible with an element of
downward herniation approximately there is restricted diffusion
associated with the intraparenchymal hemorrhage. Separate from the
hemorrhage, there are areas of restricted diffusion and edema
involving the left greater than right mesial temporal
lobes/parahippocampal regions (see series 3, images 17 through 20
and series 10, image 12 on the right and series 3, image 23 on the
left), and bilateral basal ganglia (see series 3, image 25) which do
not demonstrate susceptibility artifact and did not demonstrate
hyperdensity on recent CT head, concerning for areas of acute
infarct. Additional small area of restricted diffusion in the
splenium of the corpus callosum (series 3, image 28). Area of
restricted diffusion in the superior pons along the inferior aspect
of the hemorrhage may represent additional site of acute infarct
versus signal abnormality from hemorrhage. Additional small focus of
apparent restricted diffusion along the right frontal convexity (see
series 3, image 25), which is of unclear etiology but could
potentially represent a small infarct versus extra-axial
process/hemorrhage. Similar size of the ventricular system with
right ventriculostomy catheter in place. Similar rounding of the
temporal horns.

Vascular: Major arterial flow voids are maintained at the skull
base. Please see recent CTA for further evaluation.

Skull and upper cervical spine: Normal marrow signal.

Sinuses/Orbits: Scattered paranasal sinus mucosal thickening with
air-fluid levels in bilateral maxillary sinuses, left frontal sinus
and left sphenoid sinus. Unremarkable orbits.

Other: Left mastoid effusion.
IMPRESSION: 1. When comparing across modalities, similar intraparenchymal
hemorrhage centered within the right thalamus and basal ganglia
extending into the right midbrain with intraventricular extension.
There is effacement of the prepontine cistern and similar 4 mm of
leftward midline shift.
2. While acute blood products limits evaluation, there are separate
areas of restricted diffusion and edema involving the right greater
than left mesial temporal lobes/parahippocampal cortex, bilateral
basal ganglia, and possibly the upper pons that are concerning for
acute infarcts, most likely secondary to mass effect from the
hemorrhage.
3. While concerning for infarcts, some of these areas (particularly
the right parahippocampal and the splenium of the corpus callosum)
could also be in part secondary to drugs, seizures, or metabolic
derangement.
4. Similar size of the ventricular system with right frontal
ventriculostomy catheter in place. Similar rounding of the temporal
horns, which warrants attention on follow-up.

Findings discussed with Dr. CHAI At [DATE] via telephone.

ADDENDUM:
CORRECTION: Conclusion #2 should include the splenium of the corpus
callosum as a site of restricted diffusion. This finding was
discussed with Dr. CHAI when we discussed the case.

*** End of Addendum ***
FINDINGS: Brain: Redemonstrated large intraparenchymal hemorrhage centered in
the right thalamus and basal ganglia with extension inferiorly into
the midbrain as well as intraventricular extension of hemorrhage
into the lateral ventricles and fourth ventricle. When comparing
across modalities, similar size of the hemorrhage and similar volume
of intraventricular blood. There is approximately 4 mm of leftward
midline shift at the foramina Monroe, similar to prior. There is
effacement of the basal cisterns, compatible with an element of
downward herniation approximately there is restricted diffusion
associated with the intraparenchymal hemorrhage. Separate from the
hemorrhage, there are areas of restricted diffusion and edema
involving the left greater than right mesial temporal
lobes/parahippocampal regions (see series 3, images 17 through 20
and series 10, image 12 on the right and series 3, image 23 on the
left), and bilateral basal ganglia (see series 3, image 25) which do
not demonstrate susceptibility artifact and did not demonstrate
hyperdensity on recent CT head, concerning for areas of acute
infarct. Additional small area of restricted diffusion in the
splenium of the corpus callosum (series 3, image 28). Area of
restricted diffusion in the superior pons along the inferior aspect
of the hemorrhage may represent additional site of acute infarct
versus signal abnormality from hemorrhage. Additional small focus of
apparent restricted diffusion along the right frontal convexity (see
series 3, image 25), which is of unclear etiology but could
potentially represent a small infarct versus extra-axial
process/hemorrhage. Similar size of the ventricular system with
right ventriculostomy catheter in place. Similar rounding of the
temporal horns.

Vascular: Major arterial flow voids are maintained at the skull
base. Please see recent CTA for further evaluation.

Skull and upper cervical spine: Normal marrow signal.

Sinuses/Orbits: Scattered paranasal sinus mucosal thickening with
air-fluid levels in bilateral maxillary sinuses, left frontal sinus
and left sphenoid sinus. Unremarkable orbits.

Other: Left mastoid effusion.
IMPRESSION: 1. When comparing across modalities, similar intraparenchymal
hemorrhage centered within the right thalamus and basal ganglia
extending into the right midbrain with intraventricular extension.
There is effacement of the prepontine cistern and similar 4 mm of
leftward midline shift.
2. While acute blood products limits evaluation, there are separate
areas of restricted diffusion and edema involving the right greater
than left mesial temporal lobes/parahippocampal cortex, bilateral
basal ganglia, and possibly the upper pons that are concerning for
acute infarcts, most likely secondary to mass effect from the
hemorrhage.
3. While concerning for infarcts, some of these areas (particularly
the right parahippocampal and the splenium of the corpus callosum)
could also be in part secondary to drugs, seizures, or metabolic
derangement.
4. Similar size of the ventricular system with right frontal
ventriculostomy catheter in place. Similar rounding of the temporal
horns, which warrants attention on follow-up.

Findings discussed with Dr. CHAI At [DATE] via telephone.

## 2020-08-28 MED ORDER — PROSOURCE TF PO LIQD: 45.0000 mL | Freq: Two times a day (BID) | ORAL | Status: DC

## 2020-08-28 MED ORDER — POTASSIUM CHLORIDE 20 MEQ PO PACK
40.0000 meq | PACK | Freq: Once | ORAL | Status: AC
  Administered 2020-08-28: 40 meq
  Filled 2020-08-28: qty 2

## 2020-08-28 MED ORDER — SENNOSIDES-DOCUSATE SODIUM 8.6-50 MG PO TABS
1.0000 | ORAL_TABLET | Freq: Two times a day (BID) | ORAL | Status: DC
  Administered 2020-08-28 – 2020-09-07 (×18): 1
  Filled 2020-08-28 (×18): qty 1

## 2020-08-28 MED ORDER — SODIUM CHLORIDE 0.9 % IV SOLN
INTRAVENOUS | Status: DC | PRN
  Administered 2020-08-28: 250 mL via INTRAVENOUS

## 2020-08-28 MED ORDER — VITAL AF 1.2 CAL PO LIQD
1000.0000 mL | ORAL | Status: AC
  Administered 2020-08-28 – 2020-09-03 (×7): 1000 mL
  Filled 2020-08-28: qty 1000

## 2020-08-28 MED ORDER — PANTOPRAZOLE SODIUM 40 MG PO PACK
40.0000 mg | PACK | Freq: Every day | ORAL | Status: DC
  Administered 2020-08-28 – 2020-08-29 (×2): 40 mg
  Filled 2020-08-28 (×2): qty 20

## 2020-08-28 MED ORDER — POTASSIUM CHLORIDE 10 MEQ/100ML IV SOLN
10.0000 meq | INTRAVENOUS | Status: AC
  Administered 2020-08-28 (×2): 10 meq via INTRAVENOUS
  Filled 2020-08-28 (×2): qty 100

## 2020-08-28 MED ORDER — SODIUM CHLORIDE 0.9 % IV SOLN: INTRAVENOUS | Status: DC

## 2020-08-28 MED ORDER — VITAL HIGH PROTEIN PO LIQD: 1000.0000 mL | ORAL | Status: DC

## 2020-08-28 MED ORDER — HEPARIN SODIUM (PORCINE) 5000 UNIT/ML IJ SOLN
5000.0000 [IU] | Freq: Three times a day (TID) | INTRAMUSCULAR | Status: DC
  Administered 2020-08-28 – 2020-08-30 (×5): 5000 [IU] via SUBCUTANEOUS
  Filled 2020-08-28 (×5): qty 1

## 2020-08-28 NOTE — Progress Notes (Signed)
OT Cancellation Note  Patient Details Name: Charlene [name] MRN: [id number] DOB: 08-31-1965   Cancelled Treatment:    Reason Eval/Treat Not Completed: Patient not medically ready. Bed rest order. Intubated. K low 2.7. Will return as schedule allows.   Christy Friede M Sohan Potvin Kaidan Spengler MSOT, OTR/L Acute Rehab Pager: [phone] Office: [phone] 08/28/2020, 10:06 AM

## 2020-08-28 NOTE — Progress Notes (Signed)
Neurologist ordered MRI this morning. Foley inserted for rentention, I&O performed x2, prior to placement. MRI obtained around 1400, upon return to the patients room, ventriculostomy stopped draining. After trouble-shooting, Dr. Franky [name] notified. He came to flush the drain and was not able to get it to flow again due to the low ICP. MRI confirmed correct placement of the IVC. He instructed to keep the drain lowered to -5cmH2O in hopes that it would eventually begin draining again. Critical care team ordered for a PICC line to be placed in order to increase the rate of 3%, but husband was not comfortable with signing the consent form, as not all of medical team agreed with the continuance of 3%. Thus, no PICC was placed. 3% was turned off around 1700, due to Na reaching goal, 155. Dr. Roda [name], attending physician, made aware.  Based off the MRI results, neurologist concerned about narrowing vessels, therefore ordered a STAT CTA. Pts kidney function was a concern for the  Administration of IV contrast, thus, MRA ordered instead. As well as, EEG. Pts family updated, very anxious and frustrated. Questions answered to the best of my ability. Need physician follow up and the care team to be on the same page.

## 2020-08-28 NOTE — Progress Notes (Addendum)
STROKE TEAM PROGRESS NOTE   INTERVAL HISTORY Patient is evaluated at the bedside.  Pt is intubated without sedation. On neuro examination today, patient is on ventilator, but moves all extremities on noxious stimuli, eyes closed, doesn't open with voice or pain.  Right pupil is enlarged 4 mm and fixed to light stimulus and left pupil is constricted.  Repeat CT head yesterday showed Stable parenchymal hemorrhage in the right thalamus and basal ganglia with intraventricular extension. S/P ventricular catheter placement on 08/27/2020. Following that Pt had labored breathing so intubated. CT head repeat post procedure didn't demonstrate any increase in size of hemorrhage.  Pt is on 3% normal saline. PT/OT Pending. Creatinine 1.94 increased from 1.09 yesterday.  K low at 2.7 which was Repleted. Feeding tube and Foleys catheter ordered.    Vitals:   08/28/20 1000 08/28/20 1006 08/28/20 1100 08/28/20 1200  BP: (!) 175/110 131/86 127/84 125/81  Pulse: (!) 102 96 97 86  Resp: 18 14 14 14   Temp:      TempSrc:      SpO2: 100% 100% 100% 100%  Weight:      Height:       CBC:  Recent Labs  Lab 08/26/20 2205 08/27/20 2020 08/28/20 0326  WBC 8.3  --  6.4  NEUTROABS 3.5  --   --   HGB 11.7* 10.9* 9.0*  HCT 37.3 32.0* 28.0*  MCV 76.6*  --  76.5*  PLT 304  --  197   Basic Metabolic Panel:  Recent Labs  Lab 08/26/20 2205 08/27/20 1949 08/27/20 2020 08/28/20 0326 08/28/20 0922  NA 140   < > 140 146* 151*  K 3.2*  --  2.5* 2.7*  --   CL 98  --   --  111  --   CO2 28  --   --  24  --   GLUCOSE 131*  --   --  109*  --   BUN 29*  --   --  28*  --   CREATININE 1.09*  --   --  1.94*  --   CALCIUM 9.8  --   --  8.3*  --   MG  --   --   --   --  1.7  PHOS  --   --   --   --  3.1   < > = values in this interval not displayed.   Lipid Panel:  Recent Labs  Lab 08/27/20 0251 08/28/20 0326  CHOL 265*  --   TRIG 129 24  HDL 72  --   CHOLHDL 3.7  --   VLDL 26  --   LDLCALC 167*  --     HgbA1c:  Recent Labs  Lab 08/27/20 0251  HGBA1C 6.0*   Urine Drug Screen:  Recent Labs  Lab 08/27/20 1854  LABOPIA POSITIVE*  COCAINSCRNUR NONE DETECTED  LABBENZ NONE DETECTED  AMPHETMU NONE DETECTED  THCU NONE DETECTED  LABBARB NONE DETECTED    Alcohol Level  Recent Labs  Lab 08/26/20 2225  ETH <10    IMAGING past 24 hours CT HEAD WO CONTRAST  Result Date: 08/27/2020 CLINICAL DATA:  Known cerebral hemorrhage, mental status change EXAM: CT HEAD WITHOUT CONTRAST TECHNIQUE: Contiguous axial images were obtained from the base of the skull through the vertex without intravenous contrast. COMPARISON:  CT imaging 08/27/2020, 08/26/2020 FINDINGS: Brain: Redemonstrated region of intraparenchymal hemorrhage centered within the right thalamus and basal ganglia seen extending inferiorly into the right cerebral peduncle  midbrain with associated intraventricular extension with hemorrhage in the right lateral ventricle and fourth ventricle. Overall size of this hemorrhage is not significantly changed from prior. Surrounding hypoattenuating edema is again seen. Stable mass effect with some effacement of the right lateral ventricle and 3 mm of right to left midline shift. No new sites of hemorrhage are identified. No CT evidence of large vascular territory infarct. A right frontal approach ventriculostomy catheter is placed with tip crossing midline, terminating near the left foramen of Monro. Vascular: No hyperdense vessel or unexpected calcification. Skull: Postsurgical changes from ventriculostomy catheter placement including crescentic right frontal scalp swelling, fluid and gas near the insertion site. Sinuses/Orbits: Paranasal sinuses and mastoid air cells are predominantly clear. Some expansile lucent changes noted in the floor the right maxillary sinus likely related to extensive periapical lucencies better seen on CT angiography images. Included orbital structures are unremarkable. Other:  None. IMPRESSION: 1. Redemonstrated region of intraparenchymal hemorrhage centered within the right thalamus and basal ganglia extending into the cerebral peduncle and midbrain with associated intraventricular extension and hemorrhage in the right lateral ventricle and fourth ventricle. Overall size of this hemorrhage is not significantly changed from prior. 2. Stable mass effect with some effacement of the right lateral ventricle and 3 mm of right to left midline shift. 3. Postsurgical changes from ventriculostomy catheter placement including crescentic right frontal scalp swelling, fluid and gas near the insertion site. 4. Some expansile lucent changes in the floor the right maxillary sinus likely related to extensive periapical lucencies better seen on CT angiography images. Electronically Signed   By: Kreg Shropshire M.D.   On: 08/27/2020 19:31   DG CHEST PORT 1 VIEW  Result Date: 08/28/2020 CLINICAL DATA:  Hypoxia EXAM: PORTABLE CHEST 1 VIEW COMPARISON:  August 27, 2020 FINDINGS: Endotracheal tube tip is at the carina currently. Nasogastric tube tip and side port are below the diaphragm. No pneumothorax. Lungs are clear. Heart size and pulmonary vascularity are normal. No adenopathy. No bone lesions. IMPRESSION: Endotracheal tube tip is at the carina. Advise withdrawing endotracheal tube 2.5-3 cm. Nasogastric tube tip and side port below diaphragm. Lungs clear. Heart size normal. These results will be called to the ordering clinician or representative by the Radiologist Assistant, and communication documented in the PACS or [employer]. Electronically Signed   By: Bretta Bang III M.D.   On: 08/28/2020 09:26   Portable Chest x-ray  Result Date: 08/27/2020 CLINICAL DATA:  55 year old female status post intubation. EXAM: PORTABLE CHEST 1 VIEW COMPARISON:  Chest radiograph dated 02/21/2014. FINDINGS: Endotracheal tube with tip at the level of the carina entering into the right mainstem bronchus.  Recommend retraction by approximately 4-5 cm. Enteric tube extends below the diaphragm with tip beyond the inferior margin of the image and side-port in the proximal stomach. The lungs are clear. There is no pleural effusion pneumothorax. The cardiac silhouette is within limits. No acute osseous pathology. IMPRESSION: Endotracheal tube in the right mainstem bronchus. Recommend retraction by approximately 4-5 cm. These results were called by telephone at the time of interpretation on 08/27/2020 at 7:09 pm to provider [facility], who verbally acknowledged these results. Electronically Signed   By: Elgie Collard M.D.   On: 08/27/2020 19:09   [name] EKG SITE RITE  Result Date: 08/28/2020 If Site Rite image not attached, placement could not be confirmed due to current cardiac rhythm.   PHYSICAL EXAM GENERAL: Well nourished, well developed. Intubated but not sedated.  HEENT: - Normocephalic and atraumatic.  LUNGS -  On Ventilator CV - no JVD, No Peripheral Edema, Tachycardia. ABDOMEN - Soft,  nondistended  Ext: warm, well perfused, intact peripheral pulses, no Peripheral edema.  Neuro - Pt is intubated but  move all 4 extremities to noxious stimuli, eyes closed, not open with voice or pain.    Left eye downward position, right eye mid position, left pupil pinpoint, right pupil 4 mm and fixed, doesn't react to light.  Left facial droop.  Left upper extremity and lower extremity mild withdraw to pain stimulation.  Right upper and lower extremity frequent antigravity movements. Sensation, coordination and gait not tested.  ASSESSMENT/PLAN  [name] is a 55 y.o. female with history of hypertension with difficulty maintaining blood pressures and noncompliance to medications for various reasons, presented to the emergency room at [facility] for left-sided weakness and headaches.  She was appear to be confused and lethargic. She was seen by telemedicine evaluation by neurologist for  concern for stroke and a noncontrast head CT showed a right thalamic/basal ganglia hemorrhage 3.2 x 2.1 x 2.5  with intraventricular extension.  He was transferred ED to ED for inpatient neurological evaluation and admission. BP high at  178/109 mmHg on Admission. CTA head & neck showed No intracranial large vessel occlusion or proximal high-grade arterial stenosis. 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 Repeat CT Head on 08/27/20 showed- Stable parenchymal hemorrhage in the right thalamus and basal ganglia with intraventricular extension. ECHO LVEF 60-65%. mild concentric left ventricular hypertrophy. HgbA1c 6.0. LDL 167. Repeat CT head (08/27/20 )showed Stable parenchymal hemorrhage in the right thalamus and basal ganglia with intraventricular extension. S/P ventricular catheter placement on 08/27/2020. Following that Pt had labored breathing so intubated. CT head repeat post procedure didn't demonstrate any increase in size of hemorrhage.  Pt is on 3% normal saline. PT/OT Pending. Creatinine 1.94 increased from 1.09 yesterday.  K low at 2.7 which was Repleted. MRI ordered today.    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 S/P ventricular catheter placement on 08/27/2020.   CT Head -  Right thalamic ICH with IVH in the third and fourth ventricles and nearly filling the right lateral ventricle. No hydrocephalus.   CTA head & neck - No intracranial large vessel occlusion or proximal high-grade arterial stenosis. 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   Repeat CT Head x 3 - Stable parenchymal hemorrhage in the right thalamus and basal ganglia with intraventricular extension as described.   2D ECHO LVEF 60-65%. mild concentric left ventricular hypertrophy  LDL 167  HgbA1c 6.0  S/P ventricular catheter placement on 08/27/2020.   VTE prophylaxis - heparin  subq  No antithrombotic prior to admission, now on No antithrombotic due to ICH.   Therapy recommendations: Pending  Disposition: TBD  Slight obstructive hydrocephalus  CT Head -  IVH in the third and fourth ventricles and nearly filling the right lateral ventricle.  Ventricle size slightly larger than her baseline CT in 04/2020  Repeat CT head x 3 stable ventricle size.   NSG on board  S/P ventricular catheter placement on 08/27/2020.   Minimal drainage  Intracranial pressure not high  Stroke - scattered b/l hippocampus, posterior CC, bilateral thalamus, and upper pons - likely due to deep penetrating vessel compression by focal mass-effect  MRI brain 2/11 right greater than left mesial temporal lobes / parahippocampal cortex, bilateral basal ganglia,  and possibly the upper pons that are concerning for acute infarcts, most likely secondary to mass effect from the hemorrhage.  MRA pending to rule out vasospasm  EEG pending to rule out seizure  On 3% saline   Na goal 150-155  Na 140->146->151  Hypertensive emergency hypotension  Home meds:  Coreg 6.25 mg and Hydrochlorothiazide 25 mg daily  Unstable  . BP goal SP <160 mmHG for now . Off Claviprex.  . Continue labetalol and levophed PRN.  Marland Kitchen Long-term BP goal normotensive  Hyperlipidemia  Home meds: None  LDL 167, goal < 70  Hold starting statin due to acute ICH  Consider statin at discharge  AKI  Cre 1.09 -> 1.94  Continue IV fluid  Foley placed  BMP monitoring  Other Stroke Risk Factors  Noncompliance  Other active issues  Hypokalemia - K low at 2.7 - Repleted.   Hospital day # 1    ATTENDING NOTE: I reviewed above note and agree with the assessment and plan. Pt was seen and examined.   RN at bedside.  Patient had EVD placed yesterday afternoon with Dr. [name].  Could be due to sedation with procedure, patient had respiratory distress, snores breathing which promoted for intubation  for airway protection.  CT head repeat stable hematoma and ventricular size and mild mass-effect.  CCM put on 3% saline with sodium goal 150-155.  On examination today, patient intubated off sedation, eyes closed, not following commands. With forced eye opening, eyes in mid position, not blinking to visual threat, doll's eyes absent, left pupil pinpoint, right pupil 3.5 mm, nonreactive to light, not tracking. Corneal reflex active on the right, minimum on the left, gag and cough present. Breathing over the vent.  Facial symmetry not able to test due to ET tube.  Tongue protrusion not cooperative. On pain stimulation, extension posturing bilateral upper extremity, mild withdraw right lower extremity and minimum movement left lower extremity. DTR diminished and no babinski. Sensation, coordination and gait not tested.  Patient anisocoria likely due to midbrain or pontine involvement.  Therefore, we had MRI brain to further evaluate brainstem structure.  It showed stable hematoma on the right thalamus and basal ganglia with extending into right midbrain and IVH.  However, it also found to have scattered stroke at bilateral hippocampus, lateral thalamus, and upper pontine.  Etiology for the stroke could be due to deep penetrating vessels compression in the setting of focal mass-effect.  Not able to do CTA head and neck given acute AKI with elevation of creatinine.  Will do MRA head to rule out vasospasm.  We will do EEG to rule out seizure.  Currently neurosurgery is on board for EVD management.  CCM also on board and initiated 3% saline with goal sodium 1 50-1 55.  Currently on tube feeding for nutrition. I had long discussion with husband over the phone, updated pt current condition, treatment plan and potential prognosis, and answered all the questions. He expressed understanding and appreciation. I have also discussed with Dr. [name].   Marvel Plan, MD PhD Stroke Neurology 08/28/2020 7:27 PM  This  patient is critically ill due to ICH, IVH and hydrocephalus, status post EVD and intubation, new scattered infarcts and at significant risk of neurological worsening, death form hematoma expansion, brain herniation, stroke extension, obstructive hydrocephalus, seizure. This patient's care requires constant monitoring of vital signs, hemodynamics, respiratory and cardiac monitoring, review of multiple databases, neurological assessment, discussion with family, other specialists and medical decision making of high complexity.  I spent 40 minutes of neurocritical care time in the care of this patient.     To contact Stroke Continuity provider, please refer to [email or web address]. After hours, contact General Neurology

## 2020-08-28 NOTE — Progress Notes (Signed)
CRITICAL VALUE ALERT  Critical Value:  Potassium 2.7  Date & Time Notied:  0435 08/28/2020  Provider Notified: E-Link RN notified  Orders Received/Actions taken: E-link notified. Awaiting new orders.

## 2020-08-28 NOTE — Progress Notes (Signed)
Stat  EEG complete - results pending.  

## 2020-08-28 NOTE — Progress Notes (Signed)
1130 Check with the RN regarding placing the PICC for this patient. Family member is still not agreeable to discussing the PICC at this time.

## 2020-08-28 NOTE — Progress Notes (Signed)
Initial Nutrition Assessment  DOCUMENTATION CODES:   Not applicable  INTERVENTION:   Initiate tube feeding via OG tube: Vital AF 1.2 at 50 ml/h (1200 ml per day)  Provides 1440 kcal, 90 gm protein, 973 ml free water daily   NUTRITION DIAGNOSIS:   Inadequate oral intake related to inability to eat as evidenced by NPO status.  GOAL:   Patient will meet greater than or equal to 90% of their needs  MONITOR:   TF tolerance,Vent status  REASON FOR ASSESSMENT:   Consult,Ventilator Enteral/tube feeding initiation and management  ASSESSMENT:   Pt with PMH of HTN with medication noncompliance admitted with hypertensive thalamic ICH.   Pt discussed during ICU rounds and with RN.   2/10 s/p EVD placement and intubation for respiratory arrest  Patient is currently intubated on ventilator support MV: 6.2 L/min Temp (24hrs), Avg:97 F (36.1 C), Min:94.1 F (34.5 C), Max:98.6 F (37 C)  Medications reviewed and include: colace, miralax, senokot-s Hypertonic saline  Labs reviewed: Na 151, K+ 2.7  EVD: 11 ml   OG tube: tip in proximal stomach   NUTRITION - FOCUSED PHYSICAL EXAM:  Flowsheet Row Most Recent Value  Orbital Region No depletion  Upper Arm Region Unable to assess  Thoracic and Lumbar Region No depletion  Buccal Region No depletion  Temple Region No depletion  Clavicle Bone Region No depletion  Clavicle and Acromion Bone Region No depletion  Scapular Bone Region Unable to assess  Dorsal Hand No depletion  Patellar Region Mild depletion  Anterior Thigh Region Moderate depletion  Posterior Calf Region Moderate depletion  Edema (RD Assessment) None  Hair Reviewed  Eyes Unable to assess  Mouth Unable to assess  Skin Reviewed  Nails Reviewed       Diet Order:   Diet Order            Diet NPO time specified  Diet effective now                 EDUCATION NEEDS:   No education needs have been identified at this time  Skin:  Skin Assessment:  Reviewed RN Assessment  Last BM:  unknown  Height:   Ht Readings from Last 1 Encounters:  08/27/20 5\' 5"  (1.651 m)    Weight:   Wt Readings from Last 1 Encounters:  08/26/20 61.2 kg    Ideal Body Weight:  56.8 kg  BMI:  Body mass index is 22.45 kg/m.  Estimated Nutritional Needs:   Kcal:  1400  Protein:  75-90 grams  Fluid:  >1.5 L/day  10/24/20., RD, LDN, CNSC See AMiON for contact information

## 2020-08-28 NOTE — Progress Notes (Signed)
1145 arrived at bedside to discuss placing the PICC with the husband. Patient's nurse informed me that the husband was not able to receive information about placing the PICC at this time. Will check back later.

## 2020-08-28 NOTE — Procedures (Signed)
Patient Name: Charlene [name]  MRN: [id number]  Epilepsy Attending: Charlsie Quest  Referring Physician/Provider: Dr Marvel Plan Date: 08/28/2020 Duration: 21.36 mins  Patient history: 54yo F with left IVH. EEG to evaluate for seizure  Level of alertness:  comatose  AEDs during EEG study: None  Technical aspects: This EEG study was done with scalp electrodes positioned according to the 10-20 International system of electrode placement. Electrical activity was acquired at a sampling rate of 500Hz  and reviewed with a high frequency filter of 70Hz  and a low frequency filter of 1Hz . EEG data were recorded continuously and digitally stored.   Description: EEG showed continuous generalized and lateralized right hemisphere 3 to 6 Hz theta-delta slowing admixed with 15-18hz  beta activity seen predominantly in left hemisphere.  Hyperventilation and photic stimulation were not performed.     ABNORMALITY -Continuous slow, generalized and lateralized right hemisphere   IMPRESSION: This study is suggestive of cortical dysfunction in right hemisphere likely secondaru to underlyig bleed as well as severe diffuse encephalopathy, nonspecific etiology. No seizures or epileptiform discharges were seen throughout the recording.   [place] Nine 

## 2020-08-28 NOTE — Progress Notes (Signed)
NAME:  Charlene [name], MRN:  [id number], DOB:  07/01/1966, LOS: 1 ADMISSION DATE:  08/26/2020, CONSULTATION DATE:  2/10 REFERRING MD:  Dr. 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  On 2/10 she had acute mental status change to obtundation and respiratory arrest resulting in intubation.  History of Present Illness:  54 year old female with past medical history as below, which is significant for hypertension and medication noncompliance.  Presented to the emergency department at [facility] 2/9 with c/o headache and left-sided weakness.  STAT CT Head demonstrated right thalamic/basal ganglia hemorrhage with intraventricular extension.  She was transferred to [facility] emergency department for in person neurology evaluation. There was evidence of obstructive hydrocephalus and neurosurgery was consulted. External ventricular drain was placed 2/10. In the hours following she became increasingly obtunded. PCCM was consulted. Upon PCCM arrival the patient became apneic and was intubated.  Past Medical History:  HTN, medication noncompliance  Significant Hospital Events:  2/09 Admit for ICH 2/10 EVD placed, later intubated due to obtundation and respiratory arrest 2/11 MRI Brain  Consults:  Stroke PCCM Neurosurgery  Procedures:  ETT 2/10 >> EVD 2/10 >>  Significant Diagnostic Tests:   CT Head 2/9 >> Acute hemorrhage with the epicenter in the right thalamus. Measurement is approximately 3.2 x 2.1 x 2.5 cm (volume 8.8 cm^3). Intraventricular penetration with blood filling the third and fourth ventricles and nearly filling the right lateral ventricle. No hydrocephalus.  CTA Head/Neck 2/10 >> Unchanged size of intraparenchymal hemorrhage centered in the right thalamus and basal ganglia with extension into the right lateral ventricle. 2. No intracranial arterial occlusion or high-grade stenosis. No dissection, aneurysm or hemodynamically  significant stenosis of the carotid or vertebral arteries.  CT Head 2/10 >> Stable parenchymal hemorrhage in the right thalamus and basal ganglia with intraventricular extension as described. No new focal abnormality is noted.  CT Head 2/10 (post intubation) >> redemonstrated intraparenchymal hemorrhage centered within the R thalamus/basal ganglia extending into the cerebral peduncle/midbrain with associated intraventricular extension and hemorrhage in the R lateral ventricle and fourth ventricle, overall not significantly changed from prior; stable mass effect, 24mm of R to L midline shift, postsurgical changes of ventriculostomy  Micro Data:  2/9 COVID + Influenza negative 2/10 MRSA PCR negative  Antimicrobials:     Interim History / Subjective:  Intubated, sedated - unable to obtain subjective history Intubated late in the evening yesterday Stable vent requirements (PEEP 5, FiO2 40%) Mild electrolyte derangements overnight, Elink contacted K supplemented Continues on hypertonic saline (Neuro managing)  Objective   Blood pressure (!) 158/104, pulse (!) 114, temperature 98.1 F (36.7 C), temperature source Oral, resp. rate 16, height 5\' 5"  (1.651 m), weight 61.2 kg, last menstrual period 03/03/2014, SpO2 100 %, unknown if currently breastfeeding.    Vent Mode: PRVC FiO2 (%):  [40 %-100 %] 40 % Set Rate:  [14 bmp-20 bmp] 14 bmp Vt Set:  [450 mL-460 mL] 450 mL PEEP:  [5 cmH20] 5 cmH20 Plateau Pressure:  [12 cmH20-15 cmH20] 13 cmH20   Intake/Output Summary (Last 24 hours) at 08/28/2020 0826 Last data filed at 08/28/2020 0700 Gross per 24 hour  Intake 2986.11 ml  Output 1011 ml  Net 1975.11 ml   Filed Weights   08/26/20 2237  Weight: 61.2 kg    Examination: General: Thin, acutely ill-appearing female; intubated/mildly sedated in NAD. HEENT: [place]/AT, EVD in place on R with shaved area and BioPatch/dressing intact. Pupils uneven  with R fixed and dilated, L reactive. Moist mucous  membranes. ETT/OGT in place. Neuro: Intubated, mildly sedated. Does not respond to verbal or tactile stimuli. Withdraws inconsistently from pain, withdrew when L eye examined with flashlight. Not following commands. CV: RRR, no m/g/r. PULM: Breathing even and unlabored on vent (PEEP 5, FiO2 40%). Lung fields CTAB anteriorly. GI: Soft, nondistended. Hypoactive bowel sounds. Extremities: No LE edema noted. Bilateral DP pulses present and equal. Skin: Warm/dry, no rashes noted.  Resolved Hospital Problem list     Assessment & Plan:   Intra-parenchymal hemorrhage with R thalamic IPH with extension into the third, forth, and R lateral ventricle.  Obstructive hydrocephalus Hypertensive emergency S/p R thalamic ICH with resultant obstructive hydrocephalus. Underwent EVD placement 2/10 and became increasingly obtunded post-procedure. Follow up CT Head grossly unchanged with stable appearance of ICH. - Management per Stroke service and Neurosurgery - Goal SBP < 160, clevidipine weaned to off - Hypertonic saline per Neurology - Na checks Q6H  (goal 145-150) - MRI pending  Acute hypoxemic respiratory failure secondary to above - Continue full vent support - Wean FiO2 for goal sat > 90% - VAP bundle - Wean sedation as able for RASS goal -1 to -2  Inadequate caloric intake - TFs initiated today  Best practice (evaluated daily)  Diet: NPO Pain/Anxiety/Delirium protocol (if indicated): Per protocol VAP protocol (if indicated): In place DVT prophylaxis: No chemoprophylaxis, given bleed GI prophylaxis: PPI Glucose control: SSI Mobility: BR Disposition: ICU  Goals of Care:  Last date of multidisciplinary goals of care discussion: Per primary Family and staff present:  Summary of discussion:  Follow up goals of care discussion due:  Code Status: FULL  Critical care time: 34 minutes   Tim Lair, PA-C [facility] 08/28/20 8:26 AM  Please see [email or web address]  for pager details.

## 2020-08-28 NOTE — Progress Notes (Signed)
Transported patient to MRI while on the vent. Patient remained hemodynamically stable during the trip.

## 2020-08-28 NOTE — Progress Notes (Signed)
eLink Physician-Brief Progress Note Patient Name: Charlene [name] DOB: July 16, 1966 MRN: [id number]   Date of Service  08/28/2020  HPI/Events of Note  K 2.7. Cr 1.94.  eICU Interventions  Ordered 40 mEq KCl powder per tube and 20 mEq KCl IV peripherally. Repeat BMP at 1300 hrs.     Intervention Category Intermediate Interventions: Electrolyte abnormality - evaluation and management  Janae Bridgeman 08/28/2020, 4:46 AM

## 2020-08-28 NOTE — Progress Notes (Signed)
SLP Cancellation Note  Patient Details Name: PA TENNANT MRN: [id number] DOB: Mar 24, 1966   Cancelled treatment:   Pt is currently on vent. Will follow up for evaluation post-extubation.         Olanda Downie L. Samson [name], MA CCC/SLP Acute Rehabilitation Services Office number [phone] Pager [phone]  [name] [name] 08/28/2020, 10:03 AM

## 2020-08-28 NOTE — Progress Notes (Signed)
PT Cancellation Note  Patient Details Name: Charlene [name] MRN: [id number] DOB: 1965-08-30   Cancelled Treatment:    Reason Eval/Treat Not Completed: Medical issues which prohibited therapy;Active bedrest order this morning. PT will continue to follow and evaluate when medically appropriate.   Deland Pretty, DPT   Acute Rehabilitation Department Pager #: [phone]   Gaetana Michaelis 08/28/2020, 10:06 AM

## 2020-08-29 DIAGNOSIS — Z9911 Dependence on respirator [ventilator] status: Secondary | ICD-10-CM

## 2020-08-29 LAB — URINALYSIS, ROUTINE W REFLEX MICROSCOPIC
Bilirubin Urine: NEGATIVE
Glucose, UA: 250 mg/dL — AB
Ketones, ur: NEGATIVE mg/dL
Leukocytes,Ua: NEGATIVE
Nitrite: NEGATIVE
Protein, ur: 100 mg/dL — AB
Specific Gravity, Urine: >1.03 — ABNORMAL HIGH (ref 1.005–1.030)
pH: 5.5 (ref 5.0–8.0)

## 2020-08-29 LAB — SODIUM
Sodium: 154 mmol/L — ABNORMAL HIGH (ref 135–145)
Sodium: 155 mmol/L — ABNORMAL HIGH (ref 135–145)
Sodium: 153 mmol/L — ABNORMAL HIGH (ref 135–145)

## 2020-08-29 LAB — BASIC METABOLIC PANEL
Anion gap: 8 (ref 5–15)
BUN: 20 mg/dL (ref 6–20)
CO2: 24 mmol/L (ref 22–32)
Calcium: 9.3 mg/dL (ref 8.9–10.3)
Chloride: 122 mmol/L — ABNORMAL HIGH (ref 98–111)
Creatinine, Ser: 1.09 mg/dL — ABNORMAL HIGH (ref 0.44–1.00)
GFR, Estimated: >60 mL/min (ref >60)
Glucose, Bld: 164 mg/dL — ABNORMAL HIGH (ref 70–99)
Potassium: 3.4 mmol/L — ABNORMAL LOW (ref 3.5–5.1)
Sodium: 154 mmol/L — ABNORMAL HIGH (ref 135–145)

## 2020-08-29 LAB — MAGNESIUM
Magnesium: 2.1 mg/dL (ref 1.7–2.4)
Magnesium: 1.9 mg/dL (ref 1.7–2.4)

## 2020-08-29 LAB — CBC
HCT: 30.8 % — ABNORMAL LOW (ref 36.0–46.0)
Hemoglobin: 9.4 g/dL — ABNORMAL LOW (ref 12.0–15.0)
MCH: 24.2 pg — ABNORMAL LOW (ref 26.0–34.0)
MCHC: 30.5 g/dL (ref 30.0–36.0)
MCV: 79.4 fL — ABNORMAL LOW (ref 80.0–100.0)
Platelets: 181 10*3/uL (ref 150–400)
RBC: 3.88 MIL/uL (ref 3.87–5.11)
RDW: 15.8 % — ABNORMAL HIGH (ref 11.5–15.5)
WBC: 14 10*3/uL — ABNORMAL HIGH (ref 4.0–10.5)
nRBC: 0 % (ref 0.0–0.2)

## 2020-08-29 LAB — URINALYSIS, MICROSCOPIC (REFLEX): Bacteria, UA: NONE SEEN

## 2020-08-29 LAB — PROCALCITONIN: Procalcitonin: 0.14 ng/mL

## 2020-08-29 LAB — GLUCOSE, CAPILLARY
Glucose-Capillary: 123 mg/dL — ABNORMAL HIGH (ref 70–99)
Glucose-Capillary: 128 mg/dL — ABNORMAL HIGH (ref 70–99)
Glucose-Capillary: 162 mg/dL — ABNORMAL HIGH (ref 70–99)
Glucose-Capillary: 182 mg/dL — ABNORMAL HIGH (ref 70–99)
Glucose-Capillary: 184 mg/dL — ABNORMAL HIGH (ref 70–99)
Glucose-Capillary: 193 mg/dL — ABNORMAL HIGH (ref 70–99)

## 2020-08-29 LAB — PHOSPHORUS
Phosphorus: 1.4 mg/dL — ABNORMAL LOW (ref 2.5–4.6)
Phosphorus: 3.2 mg/dL (ref 2.5–4.6)

## 2020-08-29 MED ORDER — VANCOMYCIN HCL 500 MG/100ML IV SOLN
500.0000 mg | Freq: Two times a day (BID) | INTRAVENOUS | Status: DC
  Administered 2020-08-30 – 2020-09-01 (×5): 500 mg via INTRAVENOUS
  Filled 2020-08-29 (×5): qty 100

## 2020-08-29 MED ORDER — INSULIN ASPART 100 UNIT/ML ~~LOC~~ SOLN
0.0000 [IU] | SUBCUTANEOUS | Status: DC
  Administered 2020-08-29: 1 [IU] via SUBCUTANEOUS
  Administered 2020-08-29 – 2020-08-30 (×2): 2 [IU] via SUBCUTANEOUS
  Administered 2020-08-30: 1 [IU] via SUBCUTANEOUS
  Administered 2020-08-30: 2 [IU] via SUBCUTANEOUS
  Administered 2020-08-30 (×2): 1 [IU] via SUBCUTANEOUS
  Administered 2020-08-30: 2 [IU] via SUBCUTANEOUS
  Administered 2020-08-31: 1 [IU] via SUBCUTANEOUS
  Administered 2020-08-31: 2 [IU] via SUBCUTANEOUS
  Administered 2020-08-31: 1 [IU] via SUBCUTANEOUS
  Administered 2020-08-31 – 2020-09-01 (×5): 2 [IU] via SUBCUTANEOUS
  Administered 2020-09-01: 1 [IU] via SUBCUTANEOUS
  Administered 2020-09-01 (×2): 2 [IU] via SUBCUTANEOUS
  Administered 2020-09-01 – 2020-09-02 (×2): 1 [IU] via SUBCUTANEOUS
  Administered 2020-09-02 (×2): 2 [IU] via SUBCUTANEOUS
  Administered 2020-09-02 (×2): 1 [IU] via SUBCUTANEOUS
  Administered 2020-09-02: 2 [IU] via SUBCUTANEOUS
  Administered 2020-09-03: 1 [IU] via SUBCUTANEOUS
  Administered 2020-09-03: 2 [IU] via SUBCUTANEOUS
  Administered 2020-09-03: 1 [IU] via SUBCUTANEOUS
  Administered 2020-09-03 (×2): 2 [IU] via SUBCUTANEOUS
  Administered 2020-09-03: 1 [IU] via SUBCUTANEOUS
  Administered 2020-09-04: 2 [IU] via SUBCUTANEOUS
  Administered 2020-09-04: 1 [IU] via SUBCUTANEOUS
  Administered 2020-09-04: 2 [IU] via SUBCUTANEOUS
  Administered 2020-09-04: 1 [IU] via SUBCUTANEOUS
  Administered 2020-09-04 – 2020-09-05 (×4): 2 [IU] via SUBCUTANEOUS
  Administered 2020-09-05: 1 [IU] via SUBCUTANEOUS
  Administered 2020-09-05 (×2): 2 [IU] via SUBCUTANEOUS
  Administered 2020-09-05: 1 [IU] via SUBCUTANEOUS
  Administered 2020-09-06 – 2020-09-07 (×5): 2 [IU] via SUBCUTANEOUS
  Administered 2020-09-07 (×2): 1 [IU] via SUBCUTANEOUS
  Administered 2020-09-08 (×2): 2 [IU] via SUBCUTANEOUS
  Administered 2020-09-08 (×4): 1 [IU] via SUBCUTANEOUS
  Administered 2020-09-08: 2 [IU] via SUBCUTANEOUS
  Administered 2020-09-09 – 2020-09-11 (×8): 1 [IU] via SUBCUTANEOUS
  Administered 2020-09-11 (×3): 2 [IU] via SUBCUTANEOUS
  Administered 2020-09-11 – 2020-09-12 (×3): 1 [IU] via SUBCUTANEOUS
  Administered 2020-09-12: 2 [IU] via SUBCUTANEOUS
  Administered 2020-09-12 – 2020-09-16 (×17): 1 [IU] via SUBCUTANEOUS
  Administered 2020-09-17: 2 [IU] via SUBCUTANEOUS
  Administered 2020-09-17 (×3): 1 [IU] via SUBCUTANEOUS
  Administered 2020-09-17: 2 [IU] via SUBCUTANEOUS
  Administered 2020-09-18 – 2020-09-25 (×21): 1 [IU] via SUBCUTANEOUS
  Administered 2020-09-25: 2 [IU] via SUBCUTANEOUS
  Administered 2020-09-25 – 2020-09-27 (×5): 1 [IU] via SUBCUTANEOUS
  Administered 2020-09-27: 2 [IU] via SUBCUTANEOUS
  Administered 2020-09-27 – 2020-09-28 (×4): 1 [IU] via SUBCUTANEOUS
  Administered 2020-09-29: 2 [IU] via SUBCUTANEOUS
  Administered 2020-09-29 – 2020-10-01 (×6): 1 [IU] via SUBCUTANEOUS
  Administered 2020-10-01: 2 [IU] via SUBCUTANEOUS
  Administered 2020-10-02 (×3): 1 [IU] via SUBCUTANEOUS
  Administered 2020-10-03: 2 [IU] via SUBCUTANEOUS
  Administered 2020-10-03: 1 [IU] via SUBCUTANEOUS
  Administered 2020-10-04 (×2): 2 [IU] via SUBCUTANEOUS
  Administered 2020-10-04 (×2): 1 [IU] via SUBCUTANEOUS
  Administered 2020-10-05 (×2): 2 [IU] via SUBCUTANEOUS
  Administered 2020-10-06 (×2): 1 [IU] via SUBCUTANEOUS
  Administered 2020-10-06: 2 [IU] via SUBCUTANEOUS
  Administered 2020-10-06 – 2020-10-13 (×28): 1 [IU] via SUBCUTANEOUS
  Administered 2020-10-14: 2 [IU] via SUBCUTANEOUS
  Administered 2020-10-14 – 2020-10-15 (×6): 1 [IU] via SUBCUTANEOUS
  Administered 2020-10-15 (×3): 2 [IU] via SUBCUTANEOUS
  Administered 2020-10-16 (×5): 1 [IU] via SUBCUTANEOUS
  Administered 2020-10-16: 2 [IU] via SUBCUTANEOUS
  Administered 2020-10-16 – 2020-10-18 (×9): 1 [IU] via SUBCUTANEOUS
  Administered 2020-10-18: 2 [IU] via SUBCUTANEOUS
  Administered 2020-10-19: 1 [IU] via SUBCUTANEOUS
  Administered 2020-10-19 (×2): 2 [IU] via SUBCUTANEOUS
  Administered 2020-10-19 (×2): 1 [IU] via SUBCUTANEOUS
  Administered 2020-10-19: 2 [IU] via SUBCUTANEOUS
  Administered 2020-10-20 (×3): 1 [IU] via SUBCUTANEOUS
  Administered 2020-10-20 – 2020-10-21 (×2): 2 [IU] via SUBCUTANEOUS
  Administered 2020-10-21 – 2020-10-22 (×3): 1 [IU] via SUBCUTANEOUS
  Administered 2020-10-22 (×3): 2 [IU] via SUBCUTANEOUS
  Administered 2020-10-22: 1 [IU] via SUBCUTANEOUS
  Administered 2020-10-22 – 2020-10-23 (×2): 2 [IU] via SUBCUTANEOUS
  Administered 2020-10-23: 1 [IU] via SUBCUTANEOUS
  Administered 2020-10-23: 2 [IU] via SUBCUTANEOUS
  Administered 2020-10-24 (×3): 1 [IU] via SUBCUTANEOUS
  Administered 2020-10-24: 2 [IU] via SUBCUTANEOUS
  Administered 2020-10-24 (×2): 1 [IU] via SUBCUTANEOUS
  Administered 2020-10-25: 2 [IU] via SUBCUTANEOUS
  Administered 2020-10-25: 1 [IU] via SUBCUTANEOUS
  Administered 2020-10-25: 2 [IU] via SUBCUTANEOUS
  Administered 2020-10-26 – 2020-10-31 (×21): 1 [IU] via SUBCUTANEOUS
  Administered 2020-11-01: 2 [IU] via SUBCUTANEOUS
  Administered 2020-11-01: 1 [IU] via SUBCUTANEOUS
  Administered 2020-11-02: 2 [IU] via SUBCUTANEOUS
  Administered 2020-11-02 – 2020-11-03 (×5): 1 [IU] via SUBCUTANEOUS
  Administered 2020-11-03: 2 [IU] via SUBCUTANEOUS
  Administered 2020-11-03 – 2020-11-04 (×3): 1 [IU] via SUBCUTANEOUS
  Administered 2020-11-04: 2 [IU] via SUBCUTANEOUS
  Administered 2020-11-05 – 2020-11-07 (×11): 1 [IU] via SUBCUTANEOUS
  Administered 2020-11-07 (×2): 2 [IU] via SUBCUTANEOUS
  Administered 2020-11-08 – 2020-11-11 (×14): 1 [IU] via SUBCUTANEOUS
  Administered 2020-11-11: 2 [IU] via SUBCUTANEOUS
  Administered 2020-11-11 (×2): 1 [IU] via SUBCUTANEOUS
  Administered 2020-11-12: 2 [IU] via SUBCUTANEOUS
  Administered 2020-11-12 – 2020-11-18 (×29): 1 [IU] via SUBCUTANEOUS
  Administered 2020-11-19: 2 [IU] via SUBCUTANEOUS
  Administered 2020-11-19 – 2020-11-20 (×9): 1 [IU] via SUBCUTANEOUS
  Administered 2020-11-20: 2 [IU] via SUBCUTANEOUS
  Administered 2020-11-21 – 2020-11-22 (×6): 1 [IU] via SUBCUTANEOUS
  Administered 2020-11-22: 2 [IU] via SUBCUTANEOUS
  Administered 2020-11-23 – 2020-11-24 (×3): 1 [IU] via SUBCUTANEOUS
  Administered 2020-11-24: 2 [IU] via SUBCUTANEOUS
  Administered 2020-11-24 – 2020-11-25 (×4): 1 [IU] via SUBCUTANEOUS
  Administered 2020-11-25: 2 [IU] via SUBCUTANEOUS
  Administered 2020-11-26 – 2020-11-27 (×6): 1 [IU] via SUBCUTANEOUS
  Administered 2020-11-27: 2 [IU] via SUBCUTANEOUS
  Administered 2020-11-28 (×4): 1 [IU] via SUBCUTANEOUS
  Administered 2020-11-29: 2 [IU] via SUBCUTANEOUS
  Administered 2020-11-29: 1 [IU] via SUBCUTANEOUS
  Administered 2020-11-29: 2 [IU] via SUBCUTANEOUS
  Administered 2020-11-29 – 2020-12-05 (×21): 1 [IU] via SUBCUTANEOUS
  Administered 2020-12-05: 2 [IU] via SUBCUTANEOUS
  Administered 2020-12-05 – 2020-12-13 (×23): 1 [IU] via SUBCUTANEOUS
  Administered 2020-12-13: 2 [IU] via SUBCUTANEOUS
  Administered 2020-12-14 – 2020-12-16 (×6): 1 [IU] via SUBCUTANEOUS
  Administered 2020-12-17: 2 [IU] via SUBCUTANEOUS
  Administered 2020-12-17 – 2020-12-20 (×11): 1 [IU] via SUBCUTANEOUS
  Administered 2020-12-21 (×2): 2 [IU] via SUBCUTANEOUS
  Administered 2020-12-21 – 2020-12-23 (×10): 1 [IU] via SUBCUTANEOUS
  Administered 2020-12-24: 2 [IU] via SUBCUTANEOUS
  Administered 2020-12-24 – 2020-12-25 (×4): 1 [IU] via SUBCUTANEOUS
  Administered 2020-12-25: 12:00:00 2 [IU] via SUBCUTANEOUS
  Administered 2020-12-25: 20:00:00 1 [IU] via SUBCUTANEOUS
  Administered 2020-12-25: 01:00:00 2 [IU] via SUBCUTANEOUS
  Administered 2020-12-25: 05:00:00 1 [IU] via SUBCUTANEOUS
  Administered 2020-12-25: 2 [IU] via SUBCUTANEOUS
  Administered 2020-12-26 – 2020-12-27 (×4): 1 [IU] via SUBCUTANEOUS
  Administered 2020-12-28 (×2): 2 [IU] via SUBCUTANEOUS
  Administered 2020-12-28: 09:00:00 1 [IU] via SUBCUTANEOUS
  Administered 2020-12-29: 3 [IU] via SUBCUTANEOUS
  Administered 2020-12-29: 12:00:00 2 [IU] via SUBCUTANEOUS
  Administered 2020-12-30 – 2020-12-31 (×4): 1 [IU] via SUBCUTANEOUS
  Administered 2020-12-31: 2 [IU] via SUBCUTANEOUS
  Administered 2020-12-31 – 2021-01-03 (×7): 1 [IU] via SUBCUTANEOUS
  Administered 2021-01-03: 08:00:00 2 [IU] via SUBCUTANEOUS
  Administered 2021-01-03 (×3): 1 [IU] via SUBCUTANEOUS
  Administered 2021-01-04: 2 [IU] via SUBCUTANEOUS
  Administered 2021-01-04 – 2021-01-05 (×6): 1 [IU] via SUBCUTANEOUS
  Administered 2021-01-05: 2 [IU] via SUBCUTANEOUS
  Administered 2021-01-05: 1 [IU] via SUBCUTANEOUS
  Administered 2021-01-05: 2 [IU] via SUBCUTANEOUS
  Administered 2021-01-06 – 2021-01-13 (×22): 1 [IU] via SUBCUTANEOUS
  Administered 2021-01-13: 13:00:00 2 [IU] via SUBCUTANEOUS
  Administered 2021-01-13: 05:00:00 1 [IU] via SUBCUTANEOUS
  Administered 2021-01-14: 13:00:00 2 [IU] via SUBCUTANEOUS
  Administered 2021-01-14 – 2021-01-15 (×4): 1 [IU] via SUBCUTANEOUS
  Administered 2021-01-15: 2 [IU] via SUBCUTANEOUS
  Administered 2021-01-15 – 2021-01-17 (×6): 1 [IU] via SUBCUTANEOUS
  Administered 2021-01-18: 13:00:00 2 [IU] via SUBCUTANEOUS
  Administered 2021-01-18: 21:00:00 1 [IU] via SUBCUTANEOUS
  Administered 2021-01-18: 2 [IU] via SUBCUTANEOUS
  Administered 2021-01-19 (×2): 1 [IU] via SUBCUTANEOUS
  Administered 2021-01-19: 01:00:00 2 [IU] via SUBCUTANEOUS
  Administered 2021-01-19 – 2021-01-20 (×3): 1 [IU] via SUBCUTANEOUS
  Administered 2021-01-22: 2 [IU] via SUBCUTANEOUS
  Administered 2021-01-22: 1 [IU] via SUBCUTANEOUS
  Administered 2021-01-22 – 2021-01-23 (×3): 2 [IU] via SUBCUTANEOUS
  Administered 2021-01-24: 1 [IU] via SUBCUTANEOUS
  Administered 2021-01-25: 2 [IU] via SUBCUTANEOUS
  Administered 2021-01-25: 1 [IU] via SUBCUTANEOUS
  Administered 2021-01-26: 3 [IU] via SUBCUTANEOUS
  Administered 2021-01-26 (×3): 1 [IU] via SUBCUTANEOUS
  Administered 2021-01-27: 2 [IU] via SUBCUTANEOUS
  Administered 2021-01-28 – 2021-01-29 (×3): 1 [IU] via SUBCUTANEOUS

## 2020-08-29 MED ORDER — SODIUM CHLORIDE 0.9 % IV SOLN
2.0000 g | Freq: Two times a day (BID) | INTRAVENOUS | Status: DC
  Administered 2020-08-29 – 2020-09-01 (×6): 2 g via INTRAVENOUS
  Filled 2020-08-29 (×6): qty 2

## 2020-08-29 MED ORDER — DEXMEDETOMIDINE HCL IN NACL 400 MCG/100ML IV SOLN
0.0000 ug/kg/h | INTRAVENOUS | Status: AC
  Administered 2020-08-29 – 2020-08-30 (×2): 0.4 ug/kg/h via INTRAVENOUS
  Administered 2020-08-31: 0.2 ug/kg/h via INTRAVENOUS
  Filled 2020-08-29 (×4): qty 100

## 2020-08-29 MED ORDER — POTASSIUM PHOSPHATES 15 MMOLE/5ML IV SOLN
45.0000 mmol | Freq: Once | INTRAVENOUS | Status: AC
  Administered 2020-08-29: 45 mmol via INTRAVENOUS
  Filled 2020-08-29: qty 15

## 2020-08-29 MED ORDER — VANCOMYCIN HCL 1250 MG/250ML IV SOLN
1250.0000 mg | Freq: Once | INTRAVENOUS | Status: AC
  Administered 2020-08-29: 1250 mg via INTRAVENOUS
  Filled 2020-08-29: qty 250

## 2020-08-29 NOTE — Progress Notes (Signed)
Phos 1.4, K+ 3.4 Replaced per protocol

## 2020-08-29 NOTE — Progress Notes (Signed)
Pharmacy Antibiotic Note  Charlene [name] is a 55 y.o. female admitted on 08/26/2020 with left-sided numbness and weakness, found to have right thalamic ICH extending into the ventricles.  S/p EVD placement on 08/27/20.  Pharmacy has been consulted for vancomycin and cefepime dosing for sepsis; cannot rule out CNS infection.  SCr 1.09, CrCL 54 ml/min, Tmax 100.5 (could be central), WBC 14.  Plan: Vanc 1250mg  IV x 1, then 500mg  IV Q12H for goal trough 15-20 mcg/mL Cefepime 2gm IV Q12H Monitor renal fxn, clinical progress, vanc trough as indicated  Height: 5\' 5"  (165.1 cm) Weight: 64.1 kg (141 lb 5 oz) IBW/kg (Calculated) : 57  Temp (24hrs), Avg:99.1 F (37.3 C), Min:98 F (36.7 C), Max:100.5 F (38.1 C)  Recent Labs  Lab 08/26/20 2205 08/28/20 0326 08/28/20 1614 08/29/20 0311  WBC 8.3 6.4  --  14.0*  CREATININE 1.09* 1.94* 1.28* 1.09*    Estimated Creatinine Clearance: 53.7 mL/min (A) (by C-G formula based on SCr of 1.09 mg/dL (H)).    No Known Allergies  Vanc 2/12 >> Cefepime 2/12 >>  2/10 MRSA PCR - negative 2/12 TA -  2/12 BCx -   Charlene [name] D. 4/12, PharmD, BCPS, BCCCP 08/29/2020, 3:51 PM

## 2020-08-29 NOTE — Progress Notes (Addendum)
STROKE TEAM PROGRESS NOTE   INTERVAL HISTORY Patient is evaluated at the bedside.  On neuro examination today, Pt is intubated without sedation. Extensive posturing on Lt UE and Mild Flexion of Rt UE, eyes closed, doesn't not open with voice or pain.   Hypotropia in Left eye, right eye mid position, left pupil pinpoint, right pupil 3 mm and fixed, doesn't react to light. Left facial droop.  EEG suggestive of cortical dysfunction in right hemisphere likely secondaru to underlyig bleed as well as severe diffuse encephalopathy, nonspecific etiology. No seizures or epileptiform discharges. MRA normal. MRI showed scattered b/l hippocampus, posterior CC, bilateral thalamus, and upper pons. Creatinine 1.09.  K low at 3.4, Na 154, Glucose 164, Phosphorus 1.4, Mg 2.1. KPhos repleted. WBC-14 increased from 6.4.  Vitals stable. Afebrile. Pt is off Cle trouble swallowing you end up viprex and 3% saline. On IV fluids at 50 ml/hr and Feeding tube.  Neurosurgery will clamp EVD today and will get F/U CT tomorrow. Plan to start antibiotics If Pt spikes fever. Attending to discuss prognosis and goals of care with family. Pt may need trach in future.  Ventriculostomy catheter is not been draining.  Ventriculostomy is clamped this morning with plan for repeat CT scan tomorrow morning.  Vitals:   08/29/20 0700 08/29/20 0800 08/29/20 0900 08/29/20 1000  BP: 139/73 140/77 (!) 161/75 137/70  Pulse: 76 73 (!) 147 64  Resp: 17 17 (!) 25 17  Temp:  99.9 F (37.7 C)    TempSrc:  Axillary    SpO2: 100% 100% 100% 100%  Weight:      Height:       CBC:  Recent Labs  Lab 08/26/20 2205 08/27/20 2020 08/28/20 0326 08/29/20 0311  WBC 8.3  --  6.4 14.0*  NEUTROABS 3.5  --   --   --   HGB 11.7*   < > 9.0* 9.4*  HCT 37.3   < > 28.0* 30.8*  MCV 76.6*  --  76.5* 79.4*  PLT 304  --  197 181   < > = values in this interval not displayed.   Basic Metabolic Panel:  Recent Labs  Lab 08/28/20 1614 08/28/20 2155  08/29/20 0311 08/29/20 0434 08/29/20 0851  NA 155*   < > 154*  --  154*  K 3.8  --  3.4*  --   --   CL 123*  --  122*  --   --   CO2 23  --  24  --   --   GLUCOSE 111*  --  164*  --   --   BUN 25*  --  20  --   --   CREATININE 1.28*  --  1.09*  --   --   CALCIUM 8.9  --  9.3  --   --   MG 1.9  --   --  2.1  --   PHOS 2.4*  --   --  1.4*  --    < > = values in this interval not displayed.   Lipid Panel:  Recent Labs  Lab 08/27/20 0251 08/28/20 0326  CHOL 265*  --   TRIG 129 24  HDL 72  --   CHOLHDL 3.7  --   VLDL 26  --   LDLCALC 167*  --    HgbA1c:  Recent Labs  Lab 08/27/20 0251  HGBA1C 6.0*   Urine Drug Screen:  Recent Labs  Lab 08/27/20 1854  LABOPIA POSITIVE*  COCAINSCRNUR  NONE DETECTED  LABBENZ NONE DETECTED  AMPHETMU NONE DETECTED  THCU NONE DETECTED  LABBARB NONE DETECTED    Alcohol Level  Recent Labs  Lab 08/26/20 2225  ETH <10    IMAGING past 24 hours MR ANGIO HEAD WO CONTRAST  Result Date: 08/28/2020 CLINICAL DATA:  Right thalamic and basal ganglia hemorrhage EXAM: MRA HEAD WITHOUT CONTRAST TECHNIQUE: Angiographic images of the Circle of Willis were obtained using MRA technique without intravenous contrast. COMPARISON:  MRI same day FINDINGS: Both vertebral arteries widely patent through the foramen magnum to the basilar artery. Basilar artery is widely patent. Superior cerebellar and posterior cerebral arteries are patent. No evidence of aneurysm or high flow vascular malformation to explain the intraparenchymal hemorrhage. Both internal carotid arteries are patent through the skull base and siphon regions. The anterior and middle cerebral vessels appear patent and normal. No aneurysm or vascular malformation. IMPRESSION: Negative intracranial MR angiography. No evidence of aneurysm or high flow vascular malformation to explain the intraparenchymal hemorrhage. Electronically Signed   By: Paulina Fusi M.D.   On: 08/28/2020 22:29   MR BRAIN WO  CONTRAST  Addendum Date: 08/28/2020   ADDENDUM REPORT: 08/28/2020 16:07 ADDENDUM: CORRECTION: Conclusion #2 should include the splenium of the corpus callosum as a site of restricted diffusion. This finding was discussed with Dr. Roda Shutters when we discussed the case. Electronically Signed   By: Feliberto Harts MD   On: 08/28/2020 16:07   Result Date: 08/28/2020 CLINICAL DATA:  Intracranial hemorrhage. EXAM: MRI HEAD WITHOUT CONTRAST TECHNIQUE: Multiplanar, multiecho pulse sequences of the brain and surrounding structures were obtained without intravenous contrast. COMPARISON:  Head CT from August 27, 2020. FINDINGS: Brain: Redemonstrated large intraparenchymal hemorrhage centered in the right thalamus and basal ganglia with extension inferiorly into the midbrain as well as intraventricular extension of hemorrhage into the lateral ventricles and fourth ventricle. When comparing across modalities, similar size of the hemorrhage and similar volume of intraventricular blood. There is approximately 4 mm of leftward midline shift at the foramina [place], similar to prior. There is effacement of the basal cisterns, compatible with an element of downward herniation approximately there is restricted diffusion associated with the intraparenchymal hemorrhage. Separate from the hemorrhage, there are areas of restricted diffusion and edema involving the left greater than right mesial temporal lobes/parahippocampal regions (see series 3, images 17 through 20 and series 10, image 12 on the right and series 3, image 23 on the left), and bilateral basal ganglia (see series 3, image 25) which do not demonstrate susceptibility artifact and did not demonstrate hyperdensity on recent CT head, concerning for areas of acute infarct. Additional small area of restricted diffusion in the splenium of the corpus callosum (series 3, image 28). Area of restricted diffusion in the superior pons along the inferior aspect of the hemorrhage may  represent additional site of acute infarct versus signal abnormality from hemorrhage. Additional small focus of apparent restricted diffusion along the right frontal convexity (see series 3, image 25), which is of unclear etiology but could potentially represent a small infarct versus extra-axial process/hemorrhage. Similar size of the ventricular system with right ventriculostomy catheter in place. Similar rounding of the temporal horns. Vascular: Major arterial flow voids are maintained at the skull base. Please see recent CTA for further evaluation. Skull and upper cervical spine: Normal marrow signal. Sinuses/Orbits: Scattered paranasal sinus mucosal thickening with air-fluid levels in bilateral maxillary sinuses, left frontal sinus and left sphenoid sinus. Unremarkable orbits. Other: Left mastoid effusion. IMPRESSION: 1. When  comparing across modalities, similar intraparenchymal hemorrhage centered within the right thalamus and basal ganglia extending into the right midbrain with intraventricular extension. There is effacement of the prepontine cistern and similar 4 mm of leftward midline shift. 2. While acute blood products limits evaluation, there are separate areas of restricted diffusion and edema involving the right greater than left mesial temporal lobes/parahippocampal cortex, bilateral basal ganglia, and possibly the upper pons that are concerning for acute infarcts, most likely secondary to mass effect from the hemorrhage. 3. While concerning for infarcts, some of these areas (particularly the right parahippocampal and the splenium of the corpus callosum) could also be in part secondary to drugs, seizures, or metabolic derangement. 4. Similar size of the ventricular system with right frontal ventriculostomy catheter in place. Similar rounding of the temporal horns, which warrants attention on follow-up. Findings discussed with Dr. Roda ShuttersXu At 3:13 PM via telephone. Electronically Signed: By: Feliberto HartsFrederick S  Jones MD On: 08/28/2020 15:41   EEG adult  Result Date: 08/28/2020 Charlsie QuestYadav, Priyanka O, MD     08/28/2020  7:48 PM Patient Name: Charlene [name] MRN: [id number] Epilepsy Attending: Charlsie QuestPriyanka O Yadav Referring Physician/Provider: Dr Marvel PlanJindong Xu Date: 08/28/2020 Duration: 21.36 mins Patient history: 54yo F with left IVH. EEG to evaluate for seizure Level of alertness:  comatose AEDs during EEG study: None Technical aspects: This EEG study was done with scalp electrodes positioned according to the 10-20 International system of electrode placement. Electrical activity was acquired at a sampling rate of 500Hz  and reviewed with a high frequency filter of 70Hz  and a low frequency filter of 1Hz . EEG data were recorded continuously and digitally stored. Description: EEG showed continuous generalized and lateralized right hemisphere 3 to 6 Hz theta-delta slowing admixed with 15-18hz  beta activity seen predominantly in left hemisphere.  Hyperventilation and photic stimulation were not performed.   ABNORMALITY -Continuous slow, generalized and lateralized right hemisphere IMPRESSION: This study is suggestive of cortical dysfunction in right hemisphere likely secondaru to underlyig bleed as well as severe diffuse encephalopathy, nonspecific etiology. No seizures or epileptiform discharges were seen throughout the recording. Priyanka Annabelle Harman Yadav    PHYSICAL EXAM GENERAL: Well nourished, well developed. Intubated but not sedated.  HEENT: - Normocephalic and atraumatic.  LUNGS - On Ventilator CV - no JVD, No Peripheral Edema, Tachycardia. ABDOMEN - Soft,  nondistended  Ext: warm, well perfused, intact peripheral pulses, no Peripheral edema.  Neuro - Limited exam due to intubation and AMS.  Pt is intubated. Extensive posturing on Lt UE and Mild Flexion of RUE, eyes closed, doesn't not open with voice or pain.   Hypotropia in Left eye, right eye mid position, left pupil pinpoint, right pupil 3 mm and fixed, doesn't react to  light. Left facial droop.  Extensive posturing on left side and Flexes on rightt side on noxious stimuli, Sensation, coordination and gait not tested.  ASSESSMENT/PLAN  Charlene [name] is a 55 y.o. female with history of hypertension with difficulty maintaining blood pressures and noncompliance to medications for various reasons, [facility] left-sided weakness and headaches.  She was appear to be confused and lethargic. She was seen by telemedicine evaluation by neurologist for concern for stroke and a noncontrast head CT showed a right thalamic/basal ganglia hemorrhage 3.2 x 2.1 x 2.5  with intraventricular extension.  He was transferred ED to ED for inpatient neurological evaluation and admission. BP high at  178/109 mmHg on Admission. CTA head & neck  showed No intracranial large vessel occlusion or proximal high-grade arterial stenosis. 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 Repeat CT Head on 08/27/20 showed- Stable parenchymal hemorrhage in the right thalamus and basal ganglia with intraventricular extension. ECHO LVEF 60-65%. mild concentric left ventricular hypertrophy. HgbA1c 6.0. LDL 167. Repeat CT head (08/27/20 )showed Stable parenchymal hemorrhage in the right thalamus and basal ganglia with intraventricular extension. S/P ventricular catheter placement on 08/27/2020. Following that Pt had labored breathing so intubated. CT head repeat post procedure didn't demonstrate any increase in size of hemorrhage.  3% normal saline was stopped. PT/OT Pending. MRA normal, MRI showed scattered b/l hippocampus, posterior CC, bilateral thalamus, and upper pons. Creatinine 1.09.  K low at 3.4, Na 154, Glucose 164, Phosphorus 1.4, Mg 2.1. Neurosurgery will clamp EVD today and will get F/U CT tomorrow. Attending to discuss prognosis and goals of care with family.   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 S/P ventricular catheter placement on 08/27/2020.   CT Head -  Right thalamic ICH with IVH in the third and fourth ventricles and nearly filling the right lateral ventricle. No hydrocephalus.   CTA head & neck - No intracranial large vessel occlusion or proximal high-grade arterial stenosis. 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   Repeat CT Head x 3 - Stable parenchymal hemorrhage in the right thalamus and basal ganglia with intraventricular extension as described.   2D ECHO LVEF 60-65%. mild concentric left ventricular hypertrophy  LDL 167  HgbA1c 6.0  S/P ventricular catheter placement on 08/27/2020.   VTE prophylaxis - heparin subq  No antithrombotic prior to admission, now on No antithrombotic due to ICH.   Therapy recommendations: Pending  Disposition: TBD  Slight obstructive hydrocephalus  CT Head -  IVH in the third and fourth ventricles and nearly filling the right lateral ventricle.  Ventricle size slightly larger than her baseline CT in 04/2020  Repeat CT head x 3 stable ventricle size.   NSG on board  S/P ventricular catheter placement on 08/27/2020.   Minimal drainage  Intracranial pressure not high  Neurosurgery planning to clamp EVD today and will get F/U CT tomorrow.  CCM following   Stroke - scattered b/l hippocampus, posterior CC, bilateral thalamus, and upper pons - likely due to deep penetrating vessel compression by focal mass-effect  MRI brain 2/11 right greater than left mesial temporal lobes / parahippocampal cortex, bilateral basal ganglia, and possibly the upper pons that are concerning for acute infarcts, most likely secondary to mass effect from the hemorrhage.  MRA Normal  EEG suggestive of cortical dysfunction in right hemisphere likely secondaru to underlyig bleed as well as severe diffuse encephalopathy, nonspecific etiology. No seizures or  epileptiform discharges  Off 3% saline   Na goal 150-155  Na 140->146->151>154  Hypertensive emergency hypotension  Home meds: Coreg 6.25 mg and Hydrochlorothiazide 25 mg daily  Unstable  . BP goal SP <160 mmHG for now . Off Claviprex.  . Continue labetalol and levophed PRN.  Marland Kitchen Long-term BP goal normotensive  Hyperlipidemia  Home meds: None   LDL 167, goal < 70  Hold starting statin due to acute ICH  Consider statin at discharge  AKI  Cre 1.09 -> 1.94>1.09  Continue IV fluid  Foley placed on 08/28/20  BMP monitoring  Avoid Nephrotoxic drugs.   Other Stroke Risk Factors  Noncompliance  Other active issues  Hypokalemia - K low at 3.4, Repleted.   Low P- 1.4- Repleted by [facility] day # 2 I have personally obtained history,examined this patient, reviewed notes, independently viewed imaging studies, participated in medical decision making and plan of care.ROS completed by me personally and pertinent positives fully documented  I have made any additions or clarifications directly to the above note. Agree with note above.  Patient neurological exam remains quite poor despite ventriculostomy there has been no improvement.  Given the location of the hemorrhage centered in the diencephalon and upper brainstem she is unlikely to wake up anytime soon and will need prolonged ventilatory support, tracheostomy and PEG tube.  I had a long discussion with the patient's husband over the phone regarding prognosis and answered questions.  Family is planning on family meeting with me in the next few days to decide on goals of care.  Continue aggressive care in the meantime.  Discussed with Dr. Zoe Lan critical care medicine and care team. This patient is critically ill and at significant risk of neurological worsening, death and care requires constant monitoring of vital signs, hemodynamics,respiratory and cardiac monitoring, extensive review of multiple databases, frequent  neurological assessment, discussion with family, other specialists and medical decision making of high complexity.I have made any additions or clarifications directly to the above note.This critical care time does not reflect procedure time, or teaching time or supervisory time of PA/NP/Med Resident etc but could involve care discussion time.  I spent 40 minutes of neurocritical care time  in the care of  this patient.      Delia Heady, MD Medical Director [facility] Pager: [phone] 08/29/2020 4:02 PM     To contact Stroke Continuity provider, please refer to [email or web address]. After hours, contact General Neurology

## 2020-08-29 NOTE — Progress Notes (Signed)
Pt Husband Charlene [name] contacted RN stating that he wanted a meeting with Dr. [name]. I informed him that Dr. [name] was not on call this weekend so he would have to wait until Monday to speak with him. Charlene [name] stated that was fine. He also stated that he feels he is getting conflicting information from all of the doctors and will not make any decisions until he is able to speak to Dr. [name].

## 2020-08-29 NOTE — Progress Notes (Signed)
EVD clamped per [employer] PA

## 2020-08-29 NOTE — Progress Notes (Signed)
NAME:  Charlene [name], MRN:  [id number], DOB:  10-12-1965, LOS: 2 ADMISSION DATE:  08/26/2020, CONSULTATION DATE:  2/10 REFERRING MD:  Dr. 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  On 2/10 she had acute mental status change to obtundation and respiratory arrest resulting in intubation.  History of Present Illness:  55 year old female with past medical history as below, which is significant for hypertension and medication noncompliance.  Presented to the emergency department at [facility] 2/9 with c/o headache and left-sided weakness.  STAT CT Head demonstrated right thalamic/basal ganglia hemorrhage with intraventricular extension.  She was transferred to [facility] emergency department for in person neurology evaluation. There was evidence of obstructive hydrocephalus and neurosurgery was consulted. External ventricular drain was placed 2/10. In the hours following she became increasingly obtunded. PCCM was consulted. Upon PCCM arrival the patient became apneic and was intubated.  Past Medical History:  HTN, medication noncompliance  Significant Hospital Events:  2/09 Admit for ICH 2/10 EVD placed, later intubated due to obtundation and respiratory arrest 2/11 MRI Brain, hypertonic saline d/c'd, EVD not working.  Husband did not consent to PICC   Consults:  Stroke PCCM Neurosurgery  Procedures:  ETT 2/10 >> EVD 2/10 >> non functioning 2/11 at 1500; clamped 2/12 am >>  Significant Diagnostic Tests:   CT Head 2/9 >> Acute hemorrhage with the epicenter in the right thalamus. Measurement is approximately 3.2 x 2.1 x 2.5 cm (volume 8.8 cm^3). Intraventricular penetration with blood filling the third and fourth ventricles and nearly filling the right lateral ventricle. No hydrocephalus.  CTA Head/Neck 2/10 >> Unchanged size of intraparenchymal hemorrhage centered in the right thalamus and basal ganglia with extension into the right  lateral ventricle. 2. No intracranial arterial occlusion or high-grade stenosis. No dissection, aneurysm or hemodynamically significant stenosis of the carotid or vertebral arteries.  CT Head 2/10 >> Stable parenchymal hemorrhage in the right thalamus and basal ganglia with intraventricular extension as described. No new focal abnormality is noted.  CT Head 2/10 (post intubation) >> redemonstrated intraparenchymal hemorrhage centered within the R thalamus/basal ganglia extending into the cerebral peduncle/midbrain with associated intraventricular extension and hemorrhage in the R lateral ventricle and fourth ventricle, overall not significantly changed from prior; stable mass effect, 8mm of R to L midline shift, postsurgical changes of ventriculostomy TTE 2/10 >> 1. Left ventricular ejection fraction, by estimation, is 60 to 65%. The  left ventricle has normal function. The left ventricle has no regional  wall motion abnormalities. There is mild concentric left ventricular hypertrophy. Left ventricular diastolic parameters were normal.  2. Right ventricular systolic function is normal. The right ventricular size is normal. There is normal pulmonary artery systolic pressure.  3. The mitral valve is normal in structure. Trivial mitral valve regurgitation. No evidence of mitral stenosis.  4. The aortic valve is tricuspid. Aortic valve regurgitation is not visualized. No aortic stenosis is present.  5. The inferior vena cava is normal in size with <50% respiratory variability, suggesting right atrial pressure of 8 mmHg.   MRA head 2/11 >>Negative intracranial MR angiography. No evidence of aneurysm or high flow vascular malformation to explain the intraparenchymal hemorrhage   MR brain 2/11 >> 1. When comparing across modalities, similar intraparenchymal hemorrhage centered within the right thalamus and basal ganglia extending into the right midbrain with intraventricular extension. There is  effacement of the prepontine cistern and similar 4 mm of leftward midline shift. 2. While  acute blood products limits evaluation, there are separate areas of restricted diffusion and edema involving the right greater than left mesial temporal lobes/parahippocampal cortex, bilateral basal ganglia, and possibly the upper pons that are concerning for acute infarcts, most likely secondary to mass effect from the hemorrhage. 3. While concerning for infarcts, some of these areas (particularly the right parahippocampal and the splenium of the corpus callosum) could also be in part secondary to drugs, seizures, or metabolic derangement. 4. Similar size of the ventricular system with right frontal ventriculostomy catheter in place. Similar rounding of the temporal horns, which warrants attention on follow-up.  Micro Data:  2/9 COVID + Influenza negative 2/10 MRSA PCR negative  Antimicrobials:  none  Interim History / Subjective:  EVD non functioning since 1500 2/11.  Clamped by NSGY this am with plans for repeat CTH in am and possible d/c EVD tomorrow  No further changes in neuro exam Receiving prn fentanyl, RN reports patient increasing becomes tachycardic and hypertensive with any stimulation this morning  tmax 99.9/ WBC 6-> 14  Objective   Blood pressure 137/70, pulse 64, temperature 99.9 F (37.7 C), temperature source Axillary, resp. rate 17, height 5\' 5"  (1.651 m), weight 64.1 kg, last menstrual period 03/03/2014, SpO2 100 %, unknown if currently breastfeeding.    Vent Mode: PRVC FiO2 (%):  [30 %-40 %] 30 % Set Rate:  [14 bmp] 14 bmp Vt Set:  [450 mL] 450 mL PEEP:  [5 cmH20] 5 cmH20 Plateau Pressure:  [14 cmH20] 14 cmH20   Intake/Output Summary (Last 24 hours) at 08/29/2020 1052 Last data filed at 08/29/2020 1000 Gross per 24 hour  Intake 2191.76 ml  Output 961 ml  Net 1230.76 ml   Filed Weights   08/26/20 2237 08/29/20 0500  Weight: 61.2 kg 64.1 kg     Examination: General:  Critically ill adult female in NAD intubated on MV HEENT: MM pink/moist, pupils right 4/nonreactive, left pinpoint non reactive, +bilateral corneal's, right frontal EVD -clamped Neuro: ?localizes/ withdrawals in RUE, LUE with extensor posturing, no withdrawal in lower extremities CV: rr, ST, no murmur PULM:  Non labored, clear/ diminished  GI: soft, bs +, foley  Extremities: warm/dry, no LE edema  Skin: no rashes  UOP 1475 ml/ 24hr, ~1 ml/kg/hr +337/ net +2.7L  No CXR today; labs reviewed  Resolved Hospital Problem list     Assessment & Plan:   Intra-parenchymal hemorrhage with R thalamic IPH with extension into the third, forth, and R lateral ventricle.  Obstructive hydrocephalus Hypertensive emergency S/p R thalamic ICH with resultant obstructive hydrocephalus. Underwent EVD placement 2/10 and became increasingly obtunded post-procedure. Follow up CT Head grossly unchanged with stable appearance of ICH.  EVD non functioning as of 2/11 ~1500 - Management per Stroke service and Neurosurgery - Goal SBP < 160, clevidipine / labetalol prn  - EVD clamped per NSGY, repeat CTH in am, possible d/c  2/13 - serial neuro exams - Na 154, allow to trend down. Hypertonic saline stopped 2/11 - prognosis guarded   Acute hypoxemic respiratory failure secondary to above -Continue MV support, 8cc/kg IBW with goal Pplat <30 and DP<15  -VAP prevention protocol/ PPI -PAD protocol for sedation> prn fentanyl, will add low dose precedex given HR/ BP fluctuations with stimulation  -wean FiO2 as able for SpO2 >92%  -daily SAT & SBT -may need early trach given guarded prognosis   AKI Hypokalemia/ hypophos - s/p kphos by EMD - gentle hydration, improving sCr 1.28-> 1.09 - Trend BMP / urinary  output - Replace electrolytes as indicated - Avoid nephrotoxic agents, ensure adequate renal perfusion  Inadequate caloric intake - continue TF, may need to add SSI if glucose >  180  Leukocytosis - Tmax 99.9, WBC 6-> 14 - CXR 2/11 clear, check UA  - could be neurogenic, will monitor clinically, low threshold if febrile to culture and start empiric abx  Best practice (evaluated daily)  Diet: NPO; TF  Pain/Anxiety/Delirium protocol (if indicated): prn fentanyl/ adding low dose precedex VAP protocol (if indicated): In place DVT prophylaxis: SCDs given ICH GI prophylaxis: PPI Glucose control: CBG q 4, add SSI if > 180 Mobility: BR Disposition: ICU  Goals of Care:  Last date of multidisciplinary goals of care discussion: Per primary Family and staff present:  Summary of discussion:  Follow up goals of care discussion due: per primary, consider PMT consult.  No family at bedside 2/12 on my exam.  Code Status: FULL  Critical care time: 35 minutes    Posey Boyer, ACNP [facility] 08/29/2020, 10:52 AM

## 2020-08-29 NOTE — Progress Notes (Signed)
PT Cancellation Note  Patient Details Name: Charlene [name] MRN: [id number] DOB: 28-Aug-1965   Cancelled Treatment:    Reason Eval/Treat Not Completed: Active bedrest order;Medical issues which prohibited therapy. Pt with continued intubation and sedation, PT will continue to follow and evaluate when appropriate.  Rolm Baptise, PT, DPT   Acute Rehabilitation Department Pager #: [phone]   Gaetana Michaelis 08/29/2020, 3:21 PM

## 2020-08-29 NOTE — Progress Notes (Signed)
  NEUROSURGERY PROGRESS NOTE   EVD has been nonfunctioning since approximately 1500 yesterday. Flushed by Dr [name] yesterday per nursing without improvement. Dropped to -5. Remains nonfunctioning today. No change in neuro status. Will clamp EVD and get f/u CT tomorrow. If stable, will d/c

## 2020-08-29 NOTE — Progress Notes (Signed)
Spoke with Ashok Cordia RN re PICC order. States may no longer need PICC, will request for PICC to be cancelled- husband refused PICC placement yesterday.

## 2020-08-30 ENCOUNTER — Inpatient Hospital Stay (HOSPITAL_COMMUNITY): Payer: Medicaid Other

## 2020-08-30 DIAGNOSIS — Z515 Encounter for palliative care: Secondary | ICD-10-CM

## 2020-08-30 LAB — CBC
HCT: 27.3 % — ABNORMAL LOW (ref 36.0–46.0)
Hemoglobin: 8.1 g/dL — ABNORMAL LOW (ref 12.0–15.0)
MCH: 24.1 pg — ABNORMAL LOW (ref 26.0–34.0)
MCHC: 29.7 g/dL — ABNORMAL LOW (ref 30.0–36.0)
MCV: 81.3 fL (ref 80.0–100.0)
Platelets: 162 10*3/uL (ref 150–400)
RBC: 3.36 MIL/uL — ABNORMAL LOW (ref 3.87–5.11)
RDW: 16 % — ABNORMAL HIGH (ref 11.5–15.5)
WBC: 14.4 10*3/uL — ABNORMAL HIGH (ref 4.0–10.5)
nRBC: 0 % (ref 0.0–0.2)

## 2020-08-30 LAB — BASIC METABOLIC PANEL
Anion gap: 12 (ref 5–15)
BUN: 27 mg/dL — ABNORMAL HIGH (ref 6–20)
CO2: 23 mmol/L (ref 22–32)
Calcium: 8.7 mg/dL — ABNORMAL LOW (ref 8.9–10.3)
Chloride: 120 mmol/L — ABNORMAL HIGH (ref 98–111)
Creatinine, Ser: 1.13 mg/dL — ABNORMAL HIGH (ref 0.44–1.00)
GFR, Estimated: 58 mL/min — ABNORMAL LOW (ref >60)
Glucose, Bld: 158 mg/dL — ABNORMAL HIGH (ref 70–99)
Potassium: 3.2 mmol/L — ABNORMAL LOW (ref 3.5–5.1)
Sodium: 155 mmol/L — ABNORMAL HIGH (ref 135–145)

## 2020-08-30 LAB — HEMOGLOBIN AND HEMATOCRIT, BLOOD
HCT: 25.4 % — ABNORMAL LOW (ref 36.0–46.0)
Hemoglobin: 7.8 g/dL — ABNORMAL LOW (ref 12.0–15.0)

## 2020-08-30 LAB — GLUCOSE, CAPILLARY
Glucose-Capillary: 146 mg/dL — ABNORMAL HIGH (ref 70–99)
Glucose-Capillary: 147 mg/dL — ABNORMAL HIGH (ref 70–99)
Glucose-Capillary: 150 mg/dL — ABNORMAL HIGH (ref 70–99)
Glucose-Capillary: 152 mg/dL — ABNORMAL HIGH (ref 70–99)
Glucose-Capillary: 153 mg/dL — ABNORMAL HIGH (ref 70–99)
Glucose-Capillary: 174 mg/dL — ABNORMAL HIGH (ref 70–99)

## 2020-08-30 LAB — SODIUM
Sodium: 153 mmol/L — ABNORMAL HIGH (ref 135–145)
Sodium: 154 mmol/L — ABNORMAL HIGH (ref 135–145)
Sodium: 152 mmol/L — ABNORMAL HIGH (ref 135–145)

## 2020-08-30 LAB — TYPE AND SCREEN
ABO/RH(D): O POS
Antibody Screen: NEGATIVE

## 2020-08-30 LAB — PROCALCITONIN: Procalcitonin: 0.1 ng/mL

## 2020-08-30 IMAGING — DX DG CHEST 1V PORT
1 series · 1 of 1 positions shown · non-contrast
Comparison: Portable chest [DATE] and earlier.

CLINICAL DATA: 54-year-old female with acute intracranial
hemorrhage.

EXAM:
PORTABLE CHEST 1 VIEW

[chest ap]
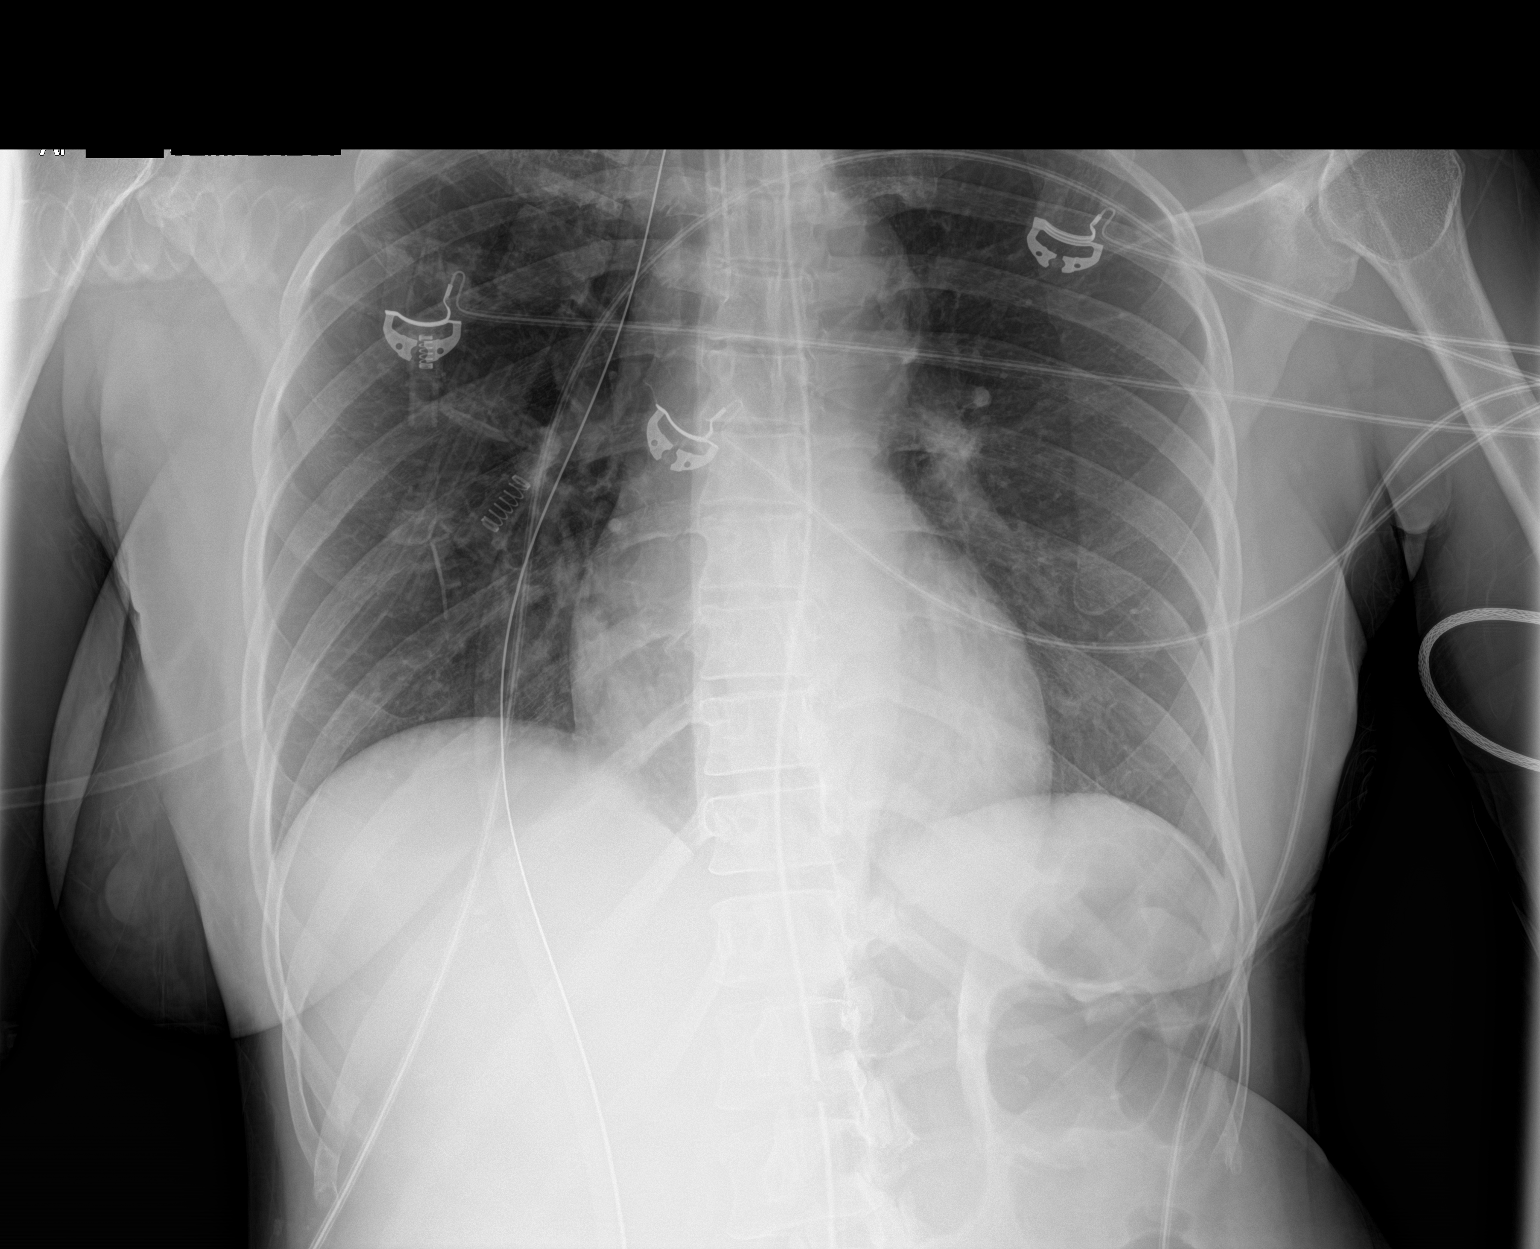

[1 of 1 positions shown; findings below may reference images not displayed]

FINDINGS: Portable AP semi upright view at [ME] hours. ET tube tip is in good
position about halfway between the clavicles and carina. Enteric
tube courses to the stomach with side hole at the level of the
gastric body. Lung volumes and mediastinal contours remain within
normal limits. Allowing for portable technique the lungs are clear.
Negative visible bowel gas pattern, osseous structures.
IMPRESSION: 1. Endotracheal tube and enteric tube now in good position.
2.  No acute cardiopulmonary abnormality.

## 2020-08-30 IMAGING — CT CT HEAD W/O CM
4 series · 16 of 47 positions shown, 18 images · non-contrast
Comparison: Head CT [DATE].  Brain MRI [DATE].

CLINICAL DATA: 54-year-old female with acute intracranial
hemorrhage.

EXAM:
CT HEAD WITHOUT CONTRAST
TECHNIQUE: Contiguous axial images were obtained from the base of the skull
through the vertex without intravenous contrast.

[Series 3: head wo · axial · 0.40mm/px · z∈[-134,-24]mm · 6 of 32 slices shown, 8 images]
[im 5/32  brain]
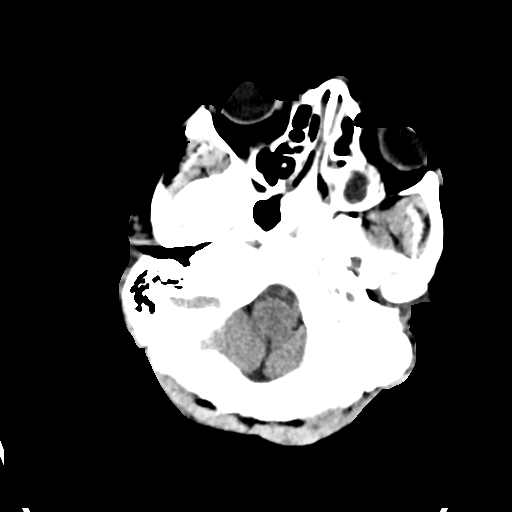
[im 5/32  bone]
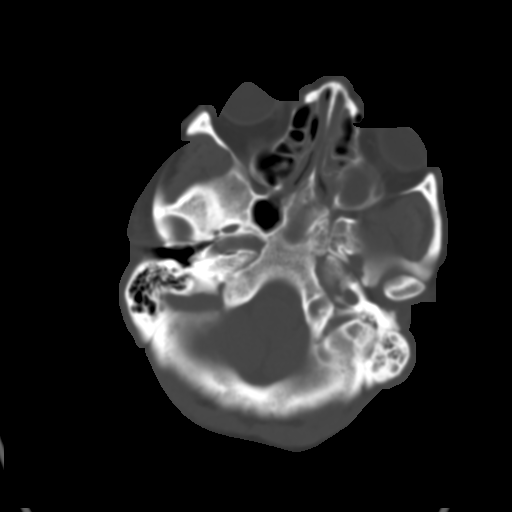
[im 9/32  brain]
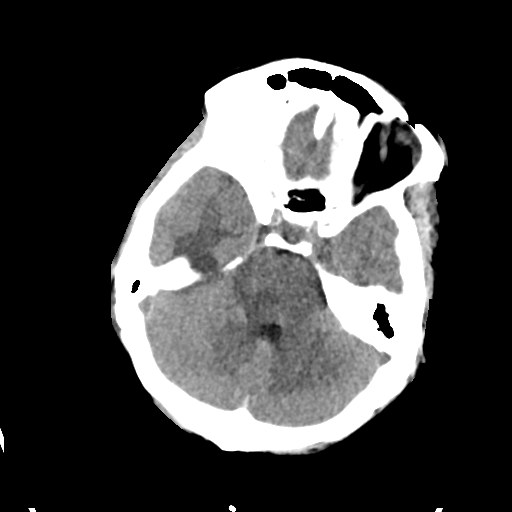
[im 14/32  brain]
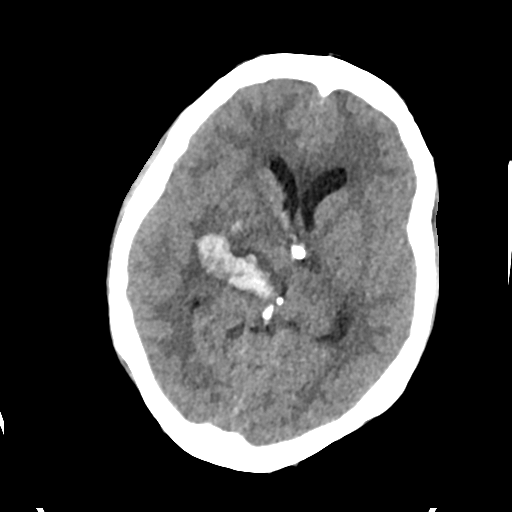
[im 18/32  brain]
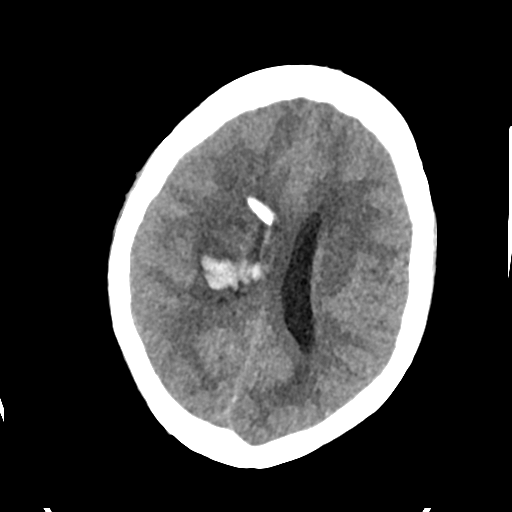
[im 23/32  brain]
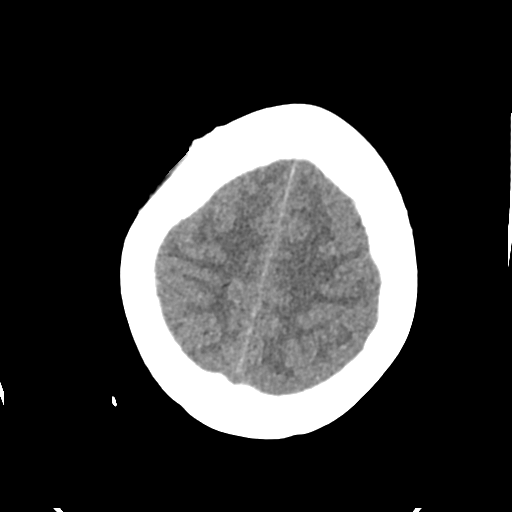
[im 23/32  bone]
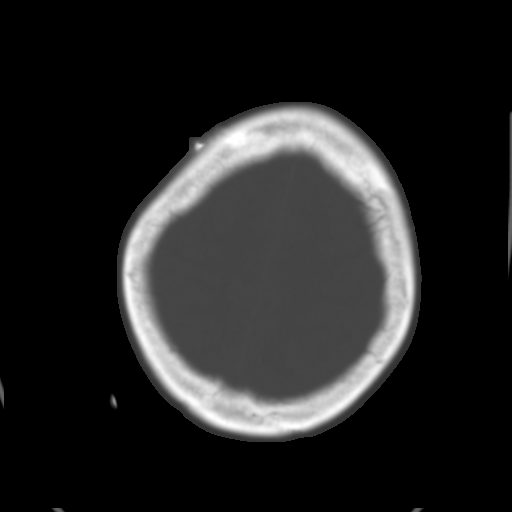
[im 27/32  brain]
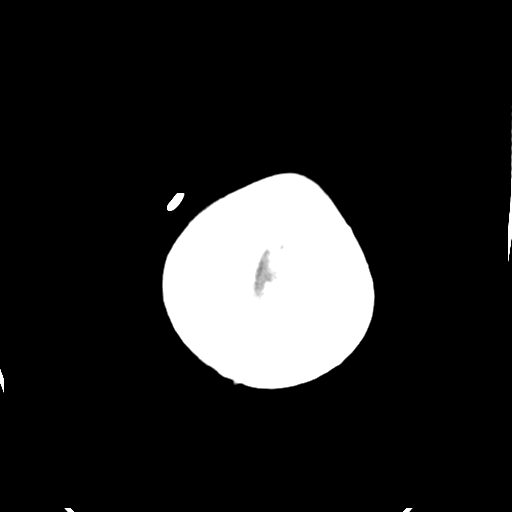

[Series 4: head bone · axial · 0.40mm/px · z∈[-140,-86]mm · 4 of 81 slices shown]
[im 8/81  bone]
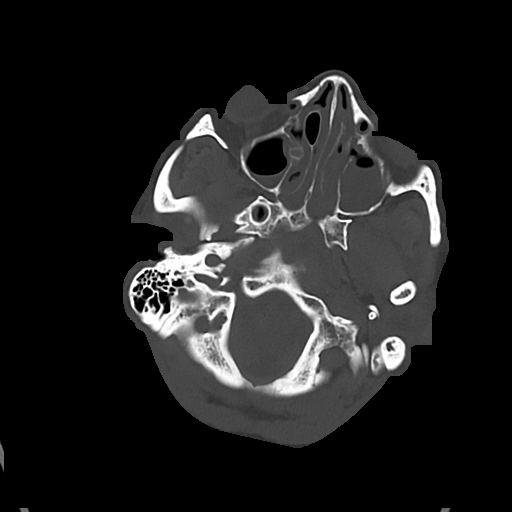
[im 16/81  bone]
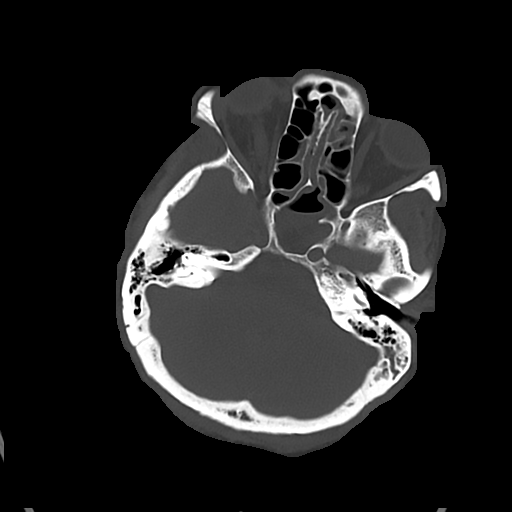
[im 27/81  bone]
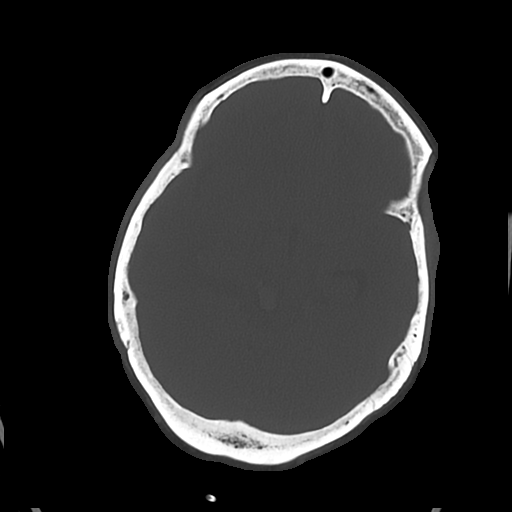
[im 35/81  bone]
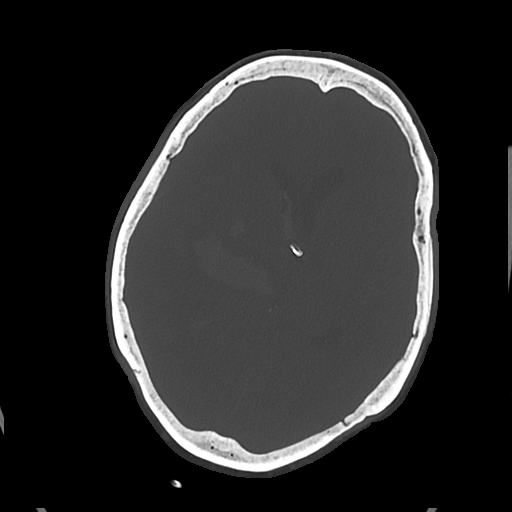

[Series 5: cor soft · coronal · 0.31mm/px · 3 of 64 slices shown]
[im 22/64  brain]
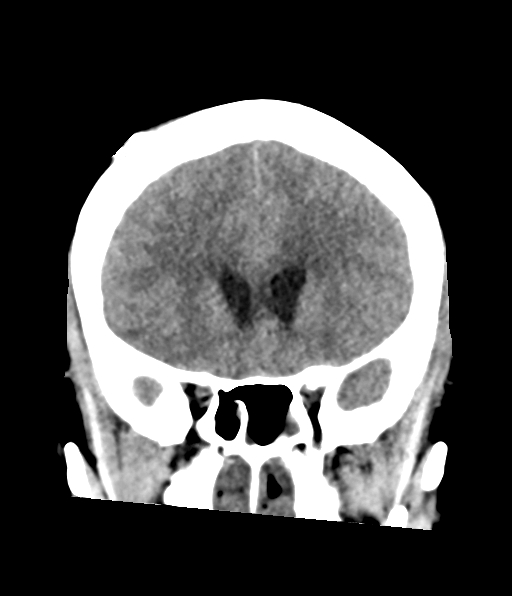
[im 29/64  brain]
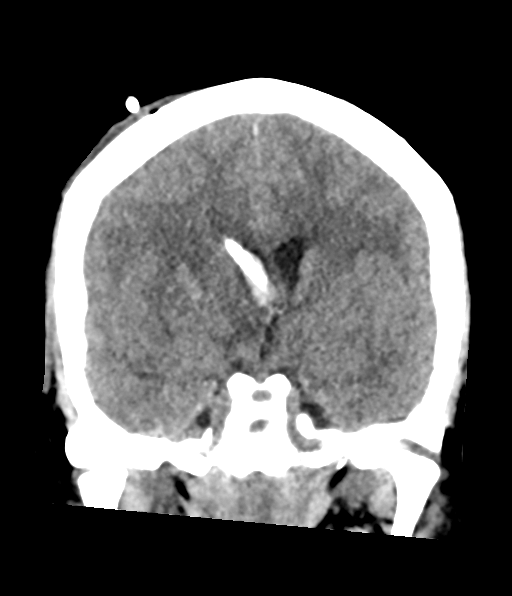
[im 36/64  brain]
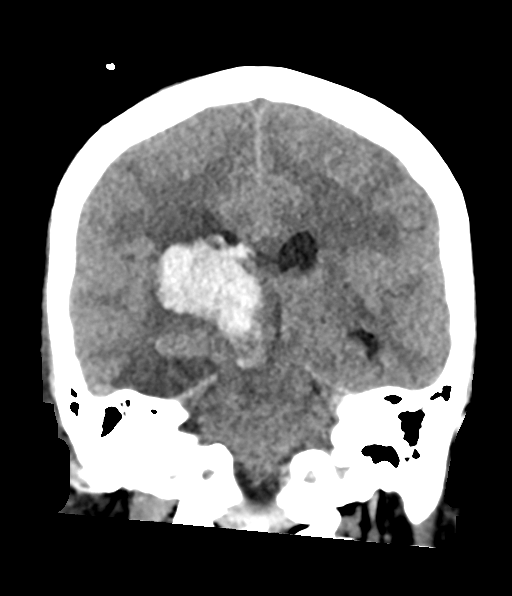

[Series 6: sag soft · sagittal · 0.36mm/px · 3 of 52 slices shown]
[im 18/52  brain]
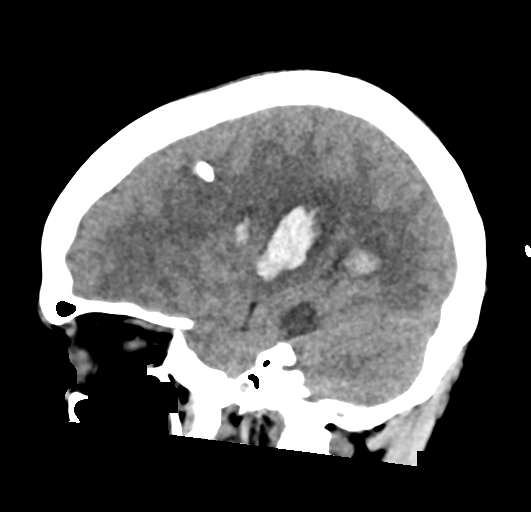
[im 26/52  brain]
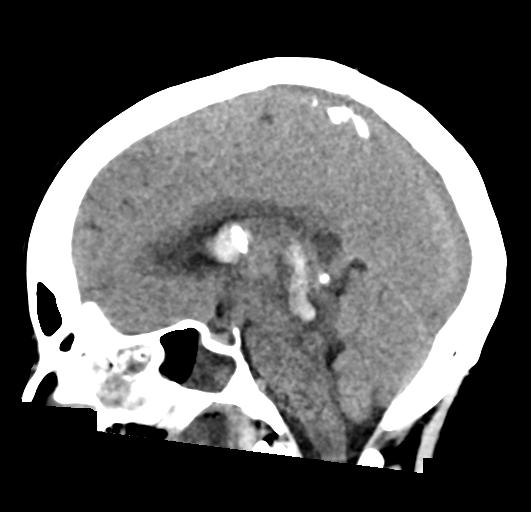
[im 35/52  brain]
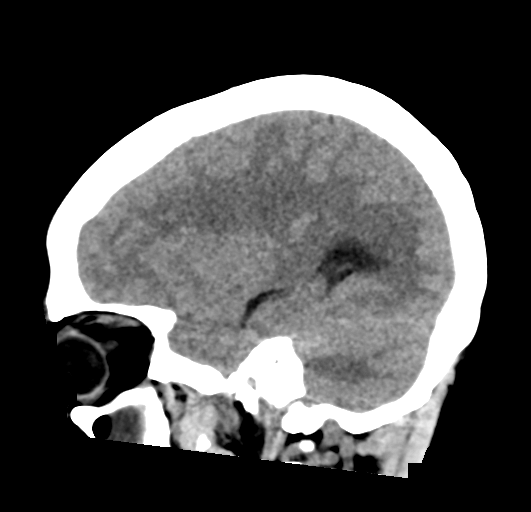

[16 of 47 positions shown; findings below may reference images not displayed]

FINDINGS: Brain: Right frontal external ventricular drain is stable
terminating just across midline to the left near the 3rd ventricle.
Minimally increased ventricle size compared to [DATE], including
both frontal horns (series 3, image 14). No transependymal edema
identified.

Curvilinear hyperdense hemorrhage tracking from the right corona
radiata through the posterior right lentiform and thalamus into the
brainstem is redemonstrated (coronal image 37). Size and
configuration of intra-axial blood has not significantly changed.
Surrounding cerebral edema is more apparent. Regional mass effect
has mildly increased, midline shift is 6 mm (previously 3-4 mm).

Intraventricular extension of blood appears stable, mostly in the
right lateral and 3rd ventricle. There is trace hemorrhage in the
left occipital horn.

Confluent hypodense cytotoxic edema appearance in the inferior right
parietal lobe corresponds to abnormal DWI on the brain MRI yesterday
and is new by CT since [DATE] (series 13, image 10).

Elsewhere gray-white matter differentiation is stable. Basilar
cisterns remain patent.

Vascular: No suspicious intracranial vascular hyperdensity.

Skull: Stable.  Right superior frontal burr hole.

Sinuses/Orbits: Bilateral sinus opacification since [DATE].
Sinus fluid levels greater on the left. Chronic appearing left
mastoid effusion. Tympanic cavities remain clear.

Other: Intubated on the scout view. Right external ventricular
drain. Stable mild postoperative scalp soft tissue changes.
Visualized orbit soft tissues are within normal limits.
IMPRESSION: 1. Infiltrative hemorrhage from the right corona radiata to the
right brainstem has not significantly changed in size or
configuration since [DATE]. Surrounding edema is more apparent,
and mild regional mass effect has increased including leftward
midline shift now 6 mm (previously 3-4 mm).

2. Cytotoxic edema in the right inferior temporal lobe now apparent
by CT, corresponding to diffusion abnormality on MRI yesterday.

3. Stable right frontal approach EVD. Stable IVH. Slightly increased
ventricle size since [DATE], no transependymal edema suspected.

## 2020-08-30 MED ORDER — POTASSIUM CHLORIDE 20 MEQ PO PACK: 40.0000 meq | PACK | Freq: Every day | ORAL | Status: DC

## 2020-08-30 MED ORDER — PANTOPRAZOLE SODIUM 40 MG PO PACK
40.0000 mg | PACK | Freq: Two times a day (BID) | ORAL | Status: DC
  Administered 2020-08-30 – 2020-12-11 (×206): 40 mg
  Filled 2020-08-30 (×208): qty 20

## 2020-08-30 MED ORDER — FUROSEMIDE 10 MG/ML IJ SOLN
20.0000 mg | Freq: Once | INTRAMUSCULAR | Status: AC
  Administered 2020-08-30: 20 mg via INTRAVENOUS
  Filled 2020-08-30: qty 2

## 2020-08-30 MED ORDER — POTASSIUM CHLORIDE 20 MEQ PO PACK
40.0000 meq | PACK | Freq: Every day | ORAL | Status: DC
  Administered 2020-08-30: 40 meq
  Filled 2020-08-30: qty 2

## 2020-08-30 MED ORDER — MAGNESIUM SULFATE 2 GM/50ML IV SOLN
2.0000 g | Freq: Once | INTRAVENOUS | Status: AC
  Administered 2020-08-30: 2 g via INTRAVENOUS
  Filled 2020-08-30: qty 50

## 2020-08-30 MED ORDER — FREE WATER: 200.0000 mL | Freq: Four times a day (QID) | Status: DC

## 2020-08-30 MED ORDER — POTASSIUM CHLORIDE 20 MEQ PO PACK
60.0000 meq | PACK | Freq: Once | ORAL | Status: AC
  Administered 2020-08-30: 60 meq
  Filled 2020-08-30: qty 3

## 2020-08-30 NOTE — Evaluation (Signed)
Physical Therapy Evaluation Patient Details Name: Charlene [name] MRN: [id number] DOB: Jan 15, 1966 Today's Date: 08/30/2020   History of Present Illness  55 yo female presents to [facility] on 2/9 with L sided weakness and headaches. PMH includes anxiety, HTN. 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 s/p Right frontal ventricular catheter placement on 2/10, stopped functioning on 2/11.  MRI 2/11 shows right greater than left mesial temporal lobes / parahippocampal cortex, bilateral basal ganglia, and possibly the upper pons that are concerning for acute infarcts, most likely secondary to mass effect from the hemorrhage. ETT 2/10-present.  Clinical Impression   Pt presents with full PROM, + response to painful stimuli in UEs, minimal response to sternal rub, no active movement, and no command following. Pt with eyes closed for duration of session, with no involvement in session.  PT to follow along with pt on a trial basis, pending pt progress. PT to progress mobility as tolerated, and will continue to follow acutely.      Follow Up Recommendations SNF;Supervision/Assistance - 24 hour    Equipment Recommendations  Wheelchair (measurements PT);Wheelchair cushion (measurements PT);Hospital bed    Recommendations for Other Services       Precautions / Restrictions Precautions Precautions: Fall Precaution Comments: EVD pulled 2/13, R handmitt Restrictions Weight Bearing Restrictions: No      Mobility  Bed Mobility Overal bed mobility: Needs Assistance             General bed mobility comments: PROM only    Transfers                 General transfer comment: PROM only  Ambulation/Gait             General Gait Details: PROM only  Stairs            Wheelchair Mobility    Modified Rankin (Stroke Patients Only) Modified  Rankin (Stroke Patients Only) Pre-Morbid Rankin Score: No symptoms Modified Rankin: Severe disability     Balance                                             Pertinent Vitals/Pain Pain Assessment: Faces Faces Pain Scale: Hurts a little bit Pain Descriptors / Indicators: Other (Comment) (withdrawal to painful stimuli) Pain Intervention(s): Limited activity within patient's tolerance;Monitored during session    Home Living Family/patient expects to be discharged to:: Private residence Living Arrangements: Spouse/significant other;Children (2 teenage children with spouse) Available Help at Discharge: Family                  Prior Function Level of Independence: Independent         Comments: unsure of home set up, no family present in room and pt not verbalizing     Hand Dominance   Dominant Hand: Right    Extremity/Trunk Assessment   Upper Extremity Assessment Upper Extremity Assessment: RUE deficits/detail;LUE deficits/detail;Difficult to assess due to impaired cognition RUE Deficits / Details: Full PROM, no active movement beyond reflexive withdrawal to pain stimulus LUE Deficits / Details: Full PROM, no active movement beyond reflexive withdrawal to pain stimulus    Lower Extremity Assessment Lower Extremity Assessment: RLE deficits/detail;LLE deficits/detail;Difficult to assess due to impaired cognition RLE Deficits / Details: Full PROM, no reflexive  withdrawal to pain stimulus LLE Deficits / Details: Full PROM, no reflexive withdrawal to pain stimulus       Communication      Cognition Arousal/Alertness: Awake/alert;Lethargic Behavior During Therapy: Flat affect Overall Cognitive Status: Difficult to assess                                        General Comments General comments (skin integrity, edema, etc.): eyes closed for duration of session, no active movement observed except for reflexive withdrawal of UEs  during painful stimuli to UEs. No startle reflex    Exercises General Exercises - Upper Extremity Shoulder Flexion: PROM;Both;10 reps;Supine Shoulder ABduction: PROM;Both;10 reps;Supine Elbow Flexion: PROM;Both;10 reps;Supine Elbow Extension: PROM;Both;10 reps;Supine Wrist Flexion: PROM;Both;10 reps;Supine Wrist Extension: PROM;Both;10 reps;Supine General Exercises - Lower Extremity Ankle Circles/Pumps: PROM;Both;10 reps;Supine Heel Slides: PROM;Both;10 reps;Supine Hip ABduction/ADduction: PROM;Both;10 reps;Supine   Assessment/Plan    PT Assessment Patient needs continued PT services  PT Problem List Decreased strength;Decreased mobility;Decreased safety awareness;Impaired tone;Decreased coordination;Decreased cognition;Cardiopulmonary status limiting activity;Decreased activity tolerance;Decreased balance;Decreased knowledge of use of DME       PT Treatment Interventions Therapeutic activities;Therapeutic exercise;Patient/family education;Balance training;Functional mobility training;Neuromuscular re-education;DME instruction    PT Goals (Current goals can be found in the Care Plan section)  Acute Rehab PT Goals PT Goal Formulation: Patient unable to participate in goal setting Time For Goal Achievement: 09/13/20 Potential to Achieve Goals: Poor    Frequency Min 2X/week   Barriers to discharge        Co-evaluation               AM-PAC PT "6 Clicks" Mobility  Outcome Measure Help needed turning from your back to your side while in a flat bed without using bedrails?: Total Help needed moving from lying on your back to sitting on the side of a flat bed without using bedrails?: Total Help needed moving to and from a bed to a chair (including a wheelchair)?: Total Help needed standing up from a chair using your arms (e.g., wheelchair or bedside chair)?: Total Help needed to walk in hospital room?: Total Help needed climbing 3-5 steps with a railing? : Total 6 Click  Score: 6    End of Session   Activity Tolerance: Treatment limited secondary to medical complications (Comment) Patient left: in bed;with call bell/phone within reach;with nursing/sitter in room Nurse Communication: Mobility status PT Visit Diagnosis: Other abnormalities of gait and mobility (R26.89);Other symptoms and signs involving the nervous system (R29.898)    Time: 6606-3016 PT Time Calculation (min) (ACUTE ONLY): 17 min   Charges:   PT Evaluation $PT Eval Low Complexity: 1 Low        Carisma Troupe S, PT Acute Rehabilitation Services Pager [phone]  Office [phone]   Aymee Fomby E Christain [place] 08/30/2020, 1:24 PM

## 2020-08-30 NOTE — Consult Note (Signed)
Consultation Note Date: 08/30/2020   Patient Name: Charlene [name]  DOB: 03/10/1966  MRN: [id number]  Age / Sex: 55 y.o., female  PCP: Patient, [name] Referring Physician: Garvin Fila, MD  Reason for Consultation: Establishing goals of care and Psychosocial/spiritual support  HPI/Patient Profile: 55 y.o. female  with past medical history of HTN who was admitted on 08/26/2020 with an [place].  EVD drain was placed and subsequently removed on 2/13.  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  Patient is currently intubated, not responsive and not on sedation. There is great concern that [name] has lost significant functionality.  PMT was consulted for goals of care.  Clinical Assessment and Goals of Care:  I have reviewed medical records including EPIC notes, labs and imaging, received report from Dr. Christella Noa, examined the patient and met at bedside with her husband Charlene [name] to discuss diagnosis prognosis, [place], EOL wishes, disposition and options.  I introduced Palliative Medicine as specialized medical care for people living with serious illness. It focuses on providing relief from the symptoms and stress of a serious illness.   We discussed a brief life review of the patient. Charlene [name] explains that Charlene [name]'s name means life and lively and that is exactly what she is like.  She is very energetic always busy doing and enjoying life.  She loves to dance and take pictures with her camera.  Most of all she loves her 6 children.  She absolutely lives for her children.  Charlene [name] alludes to some family drama that he is worried may have brought on the stroke.  [name] has 4 adult children and she and Charlene [name] have two children together (ages 33 and 33?).  [name]'s parents are also still living and involved in her life.  [name] has many grandchildren (44?).  All of the family live  locally.  As far as functional and nutritional status, Jalicia was fully active and independent prior to her stroke.  Charlene [name] had recently heard from Dr. Leonie Man on prognosis and next steps.  Afterward he talked with Dr. Christella Noa.  Consequently we did not review that information again.  Rather Charlene [name] clearly indicated that he needed time to get his thoughts together as well as figure out how to support his family.  I did give him a Hard Choices Booklet but he told me he would not read it right away.  He needed time to collect himself.  Charlene [name] is a tender heart who treasures his wife.  He describes their strong spiritual beliefs.  They are not of a particular denomination but believe in [facility] and that people must love and care for each-other.  He tells me the story of how he met Edna.  Interestingly his father tricked them into meeting.  He seemed to play match-maker.  This is a bitter sweet thought as Charlene [name] lost his father in 2019 and is still grieving him.  I attempted to elicit values and goals of care important to  the patient.  Charlene [name] states he and Delphine never talked about that kind of thing.  He asked for help with a family meeting in the next several days.  The family is very emotional and he implied that there was a lot of drama prior to Charlene [name] having a stroke consequently he wants to be certain that the information the family receives is accurate and delivered by a neutral party.  Questions and concerns were addressed.  The family was encouraged to call with questions or concerns.  Primary Decision Maker:  Uncertain.  Charlene [name] is her legal husband.  Consequently unless there is notarized HCPOA documentation indicating that another person is her HCPOA, Charlene [name] is her Media planner.      SUMMARY OF RECOMMENDATIONS    PMT will follow with you. It would benefit family to receive accurate consistent information regularly from all of the medical team members.   Perhaps it would be best to set up a  regular contact person and time.  Code Status/Advance Care Planning:  Full   Symptom Management:   [place] appears comfortable  Additional Recommendations (Limitations, Scope, Preferences):  Full Scope Treatment  Palliative Prophylaxis:   Frequent Pain Assessment  Psycho-social/Spiritual:   Desire for further Chaplaincy support: Welcomed.  Prognosis: unable to determine    Discharge Planning: To Be Determined      Primary Diagnoses: Present on Admission: . ICH (intracerebral hemorrhage) ([place])   I have reviewed the medical record, interviewed the patient and family, and examined the patient. The following aspects are pertinent.  Past Medical History:  Diagnosis Date  . Hypertension    Social History   Socioeconomic History  . Marital status: Single    Spouse name: Not on file  . Number of children: Not on file  . Years of education: Not on file  . Highest education level: Not on file  Occupational History  . Not on file  Tobacco Use  . Smoking status: Never Smoker  . Smokeless tobacco: Never Used  Substance and Sexual Activity  . Alcohol use: No  . Drug use: No  . Sexual activity: Yes    Birth control/protection: None  Other Topics Concern  . Not on file  Social History Narrative  . Not on file   Social Determinants of Health   Financial Resource Strain: Not on file  Food Insecurity: Not on file  Transportation Needs: Not on file  Physical Activity: Not on file  Stress: Not on file  Social Connections: Not on file   Family History  Problem Relation Age of Onset  . Hypertension Father   . Cancer Maternal Grandmother   . Thyroid disease Paternal Uncle     No Known Allergies    Vital Signs: BP 126/64   Pulse 65   Temp (!) 100.6 F (38.1 C) (Axillary) Comment: Nurse Notified  Resp 18   Ht '5\' 5"'  (1.651 m)   Wt 63.5 kg   LMP 03/03/2014   SpO2 100%   BMI 23.30 kg/m  Pain Scale: CPOT   Pain Score: 0-No pain   SpO2: SpO2: 100  % O2 Device:SpO2: 100 % O2 Flow Rate: .O2 Flow Rate (L/min): 2 L/min    Palliative Assessment/Data: 10%     Time In: 4:00 Time Out: 5:10 Time Total: 70 min. Visit consisted of counseling and education dealing with the complex and emotionally intense issues surrounding the need for palliative care and symptom management in the setting of serious and potentially life-threatening illness. Greater than 50%  of this time was spent counseling and coordinating care related to the above assessment and plan.  Signed by: Florentina [name], PA-C Palliative Medicine  Please contact Palliative Medicine Team phone at [phone] for questions and concerns.  For individual provider: See Shea Evans

## 2020-08-30 NOTE — Progress Notes (Signed)
STROKE TEAM PROGRESS NOTE   INTERVAL HISTORY Patient's condition remains more or less unchanged.  She remains intubated for respiratory failure with extensor posturing on Lt UE and Mild Flexion of Rt UE, eyes closed, doesn't not open with voice or pain.   Hypotropia in Left eye, right eye mid position, left pupil pinpoint, right pupil 3 mm and fixed, doesn't react to light. Left facial droop.  Repeat CT scan of the head from this morning shows stable appearance of large thalamic midbrain hemorrhage with slightly increased mass-effect and regional edema.  Ventriculostomy catheter has not been draining.  And neurosurgery plan to discontinue it today.  I spoke to the patient's husband over the phone yesterday and is requesting a family meeting to discuss with other family members.  May need to involve palliative care team discuss goals of care with family.  Serum sodium is down to 153 and she has been off hypertonic saline.  White count is elevated at 14.4.  She does have fever. Vitals:   08/30/20 0900 08/30/20 1000 08/30/20 1100 08/30/20 1200  BP: 124/69 126/68 115/64   Pulse: 63 64 64   Resp: 17 18 18    Temp:    98.9 F (37.2 C)  TempSrc:    Axillary  SpO2: 99% 99% 100%   Weight:      Height:       CBC:  Recent Labs  Lab 08/26/20 2205 08/27/20 2020 08/29/20 0311 08/30/20 0349  WBC 8.3   < > 14.0* 14.4*  NEUTROABS 3.5  --   --   --   HGB 11.7*   < > 9.4* 8.1*  HCT 37.3   < > 30.8* 27.3*  MCV 76.6*   < > 79.4* 81.3  PLT 304   < > 181 162   < > = values in this interval not displayed.   Basic Metabolic Panel:  Recent Labs  Lab 08/29/20 0311 08/29/20 0434 08/29/20 0851 08/29/20 1617 08/29/20 2114 08/30/20 0349 08/30/20 0858  NA 154*  --    < > 155*   < > 155* 153*  K 3.4*  --   --   --   --  3.2*  --   CL 122*  --   --   --   --  120*  --   CO2 24  --   --   --   --  23  --   GLUCOSE 164*  --   --   --   --  158*  --   BUN 20  --   --   --   --  27*  --   CREATININE 1.09*   --   --   --   --  1.13*  --   CALCIUM 9.3  --   --   --   --  8.7*  --   MG  --  2.1  --  1.9  --   --   --   PHOS  --  1.4*  --  3.2  --   --   --    < > = values in this interval not displayed.   Lipid Panel:  Recent Labs  Lab 08/27/20 0251 08/28/20 0326  CHOL 265*  --   TRIG 129 24  HDL 72  --   CHOLHDL 3.7  --   VLDL 26  --   LDLCALC 167*  --    HgbA1c:  Recent Labs  Lab 08/27/20 0251  HGBA1C 6.0*   Urine Drug Screen:  Recent Labs  Lab 08/27/20 1854  LABOPIA POSITIVE*  COCAINSCRNUR NONE DETECTED  LABBENZ NONE DETECTED  AMPHETMU NONE DETECTED  THCU NONE DETECTED  LABBARB NONE DETECTED    Alcohol Level  Recent Labs  Lab 08/26/20 2225  ETH <10    IMAGING past 24 hours CT HEAD WO CONTRAST  Result Date: 08/30/2020 CLINICAL DATA:  55 year old female with acute intracranial hemorrhage. EXAM: CT HEAD WITHOUT CONTRAST TECHNIQUE: Contiguous axial images were obtained from the base of the skull through the vertex without intravenous contrast. COMPARISON:  Head CT 08/27/2020.  Brain MRI 08/28/2020. FINDINGS: Brain: Right frontal external ventricular drain is stable terminating just across midline to the left near the 3rd ventricle. Minimally increased ventricle size compared to 08/27/2020, including both frontal horns (series 3, image 14). No transependymal edema identified. Curvilinear hyperdense hemorrhage tracking from the right corona radiata through the posterior right lentiform and thalamus into the brainstem is redemonstrated (coronal image 37). Size and configuration of intra-axial blood has not significantly changed. Surrounding cerebral edema is more apparent. Regional mass effect has mildly increased, midline shift is 6 mm (previously 3-4 mm). Intraventricular extension of blood appears stable, mostly in the right lateral and 3rd ventricle. There is trace hemorrhage in the left occipital horn. Confluent hypodense cytotoxic edema appearance in the inferior right  parietal lobe corresponds to abnormal DWI on the brain MRI yesterday and is new by CT since 08/27/2020 (series 13, image 10). Elsewhere gray-white matter differentiation is stable. Basilar cisterns remain patent. Vascular: No suspicious intracranial vascular hyperdensity. Skull: Stable.  Right superior frontal burr hole. Sinuses/Orbits: Bilateral sinus opacification since 08/27/2020. Sinus fluid levels greater on the left. Chronic appearing left mastoid effusion. Tympanic cavities remain clear. Other: Intubated on the scout view. Right external ventricular drain. Stable mild postoperative scalp soft tissue changes. Visualized orbit soft tissues are within normal limits. IMPRESSION: 1. Infiltrative hemorrhage from the right corona radiata to the right brainstem has not significantly changed in size or configuration since 08/27/2020. Surrounding edema is more apparent, and mild regional mass effect has increased including leftward midline shift now 6 mm (previously 3-4 mm). 2. Cytotoxic edema in the right inferior temporal lobe now apparent by CT, corresponding to diffusion abnormality on MRI yesterday. 3. Stable right frontal approach EVD. Stable IVH. Slightly increased ventricle size since 08/27/2020, no transependymal edema suspected. Electronically Signed   By: Odessa Fleming M.D.   On: 08/30/2020 09:19   DG Chest Port 1 View  Result Date: 08/30/2020 CLINICAL DATA:  55 year old female with acute intracranial hemorrhage. EXAM: PORTABLE CHEST 1 VIEW COMPARISON:  Portable chest 08/28/2020 and earlier. FINDINGS: Portable AP semi upright view at 0433 hours. ET tube tip is in good position about halfway between the clavicles and carina. Enteric tube courses to the stomach with side hole at the level of the gastric body. Lung volumes and mediastinal contours remain within normal limits. Allowing for portable technique the lungs are clear. Negative visible bowel gas pattern, osseous structures. IMPRESSION: 1. Endotracheal  tube and enteric tube now in good position. 2.  No acute cardiopulmonary abnormality. Electronically Signed   By: Odessa Fleming M.D.   On: 08/30/2020 06:27    PHYSICAL EXAM GENERAL: Well nourished, well developed middle-aged African-American lady. Intubated but not sedated.  HEENT: - Normocephalic and atraumatic.  LUNGS - On Ventilator CV - no JVD, No Peripheral Edema, Tachycardia. ABDOMEN - Soft,  nondistended  Ext: warm, well perfused, intact peripheral pulses,  no Peripheral edema.  Neuro - Limited exam due to intubation and AMS.  Pt is intubated. Extensor posturing on Lt UE and Mild Flexion of RUE, eyes closed, doesn't not open with voice or pain.   Hypotropia in Left eye, right eye mid position, left pupil pinpoint, right pupil 3 mm and fixed, doesn't react to light. Left facial droop.  Extensor posturing on left side and Flexes on rightt side on noxious stimuli, Sensation, coordination and gait not tested.  ASSESSMENT/PLAN  Charlene [name] is a 55 y.o. female with history of hypertension with difficulty maintaining blood pressures and noncompliance to medications for various reasons, [facility] for left-sided weakness and headaches.  She was appear to be confused and lethargic. She was seen by telemedicine evaluation by neurologist for concern for stroke and a noncontrast head CT showed a right thalamic/basal ganglia hemorrhage 3.2 x 2.1 x 2.5  with intraventricular extension.  He was transferred ED to ED for inpatient neurological evaluation and admission. BP high at  178/109 mmHg on Admission. CTA head & neck showed No intracranial large vessel occlusion or proximal high-grade arterial stenosis. 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 Repeat CT Head on 08/27/20 showed- Stable parenchymal hemorrhage in the right thalamus and basal ganglia with intraventricular extension. ECHO LVEF 60-65%. mild  concentric left ventricular hypertrophy. HgbA1c 6.0. LDL 167. Repeat CT head (08/27/20 )showed Stable parenchymal hemorrhage in the right thalamus and basal ganglia with intraventricular extension. S/P ventricular catheter placement on 08/27/2020. Following that Pt had labored breathing so intubated. CT head repeat post procedure didn't demonstrate any increase in size of hemorrhage.  3% normal saline was stopped. PT/OT Pending. MRA normal, MRI showed scattered b/l hippocampus, posterior CC, bilateral thalamus, and upper pons. Creatinine 1.09.  K low at 3.4, Na 154, Glucose 164, Phosphorus 1.4, Mg 2.1. Neurosurgery will clamp EVD today and will get F/U CT tomorrow. Attending to discuss prognosis and goals of care with family.   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 S/P ventricular catheter placement on 08/27/2020.  Without improvement and ventriculostomy DC on 08/30/20  CT Head -  Right thalamic ICH with IVH in the third and fourth ventricles and nearly filling the right lateral ventricle. No hydrocephalus.   CTA head & neck - No intracranial large vessel occlusion or proximal high-grade arterial stenosis. 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   Repeat CT Head x 3 - Stable parenchymal hemorrhage in the right thalamus and basal ganglia with intraventricular extension as described.   2D ECHO LVEF 60-65%. mild concentric left ventricular hypertrophy  LDL 167  HgbA1c 6.0  S/P ventricular catheter placement on 08/27/2020. And dc on 08/30/20 due to lack of movement and nonfunctioning  VTE prophylaxis - heparin subq  No antithrombotic prior to admission, now on No antithrombotic due to ICH.   Therapy recommendations: Pending  Disposition: TBD  Slight obstructive hydrocephalus  CT Head -  IVH in the third and fourth ventricles and nearly filling the right lateral ventricle.  Ventricle size slightly  larger than her baseline CT in 04/2020  Repeat CT head x 3 stable ventricle size.   NSG on board  S/P ventricular catheter placement on 08/27/2020.   Minimal drainage  Intracranial pressure not high  Neurosurgery clamped EVD 08/29/20 and dc on 08/30/20 aster f/u Ct stable  CCM  following   Stroke - scattered b/l hippocampus, posterior CC, bilateral thalamus, and upper pons - likely due to deep penetrating vessel compression by focal mass-effect  MRI brain 2/11 right greater than left mesial temporal lobes / parahippocampal cortex, bilateral basal ganglia, and possibly the upper pons that are concerning for acute infarcts, most likely secondary to mass effect from the hemorrhage.  MRA Normal  EEG suggestive of cortical dysfunction in right hemisphere likely secondaru to underlyig bleed as well as severe diffuse encephalopathy, nonspecific etiology. No seizures or epileptiform discharges  Off 3% saline   Na goal 150-155  Na 140->146->151>154  Hypertensive emergency hypotension  Home meds: Coreg 6.25 mg and Hydrochlorothiazide 25 mg daily  Unstable  . BP goal SP <160 mmHG for now . Off Claviprex.  . Continue labetalol and levophed PRN.  Marland Kitchen Long-term BP goal normotensive  Hyperlipidemia  Home meds: None   LDL 167, goal < 70  Hold starting statin due to acute ICH  Consider statin at discharge  AKI  Cre 1.09 -> 1.94>1.09  Continue IV fluid  Foley placed on 08/28/20  BMP monitoring  Avoid Nephrotoxic drugs.   Other Stroke Risk Factors  Noncompliance  Other active issues  Hypokalemia - K low at 3.4, Repleted.   Low P- 1.4- Repleted by [facility] day # 3 Patient remains critically ill with a very poor neurological exam and prognosis.  She continues to need ventilatory support and likely will need it long-term.  I had a long discussion over the phone with the patient's husband yesterday about her prognosis.  I have offered to have a family meeting with  him and other family members if they agree.  Palliative care consult would also be appropriate.  If family wants to continue aggressive care would favor doing an early tracheostomy and PEG tube next week.  Discussed with critical care team nurse practitioner and answered questions. This patient is critically ill and at significant risk of neurological worsening, death and care requires constant monitoring of vital signs, hemodynamics,respiratory and cardiac monitoring, extensive review of multiple databases, frequent neurological assessment, discussion with family, other specialists and medical decision making of high complexity.I have made any additions or clarifications directly to the above note.This critical care time does not reflect procedure time, or teaching time or supervisory time of PA/NP/Med Resident etc but could involve care discussion time.  I spent 30 minutes of neurocritical care time  in the care of  this patient.      Delia Heady, MD Medical Director [facility] Pager: [phone] 08/30/2020 12:47 PM     To contact Stroke Continuity provider, please refer to [email or web address]. After hours, contact General Neurology

## 2020-08-30 NOTE — Progress Notes (Signed)
RT transported patient from 4N30 to CT and back with RN. No complications. RT will continue to monitor.  

## 2020-08-30 NOTE — Progress Notes (Addendum)
NAME:  Charlene [name], MRN:  [id number], DOB:  03-May-1966, LOS: 3 ADMISSION DATE:  08/26/2020, CONSULTATION DATE:  2/10 REFERRING MD:  Dr. 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  On 2/10 she had acute mental status change to obtundation and respiratory arrest resulting in intubation.  History of Present Illness:  55 year old female with past medical history as below, which is significant for hypertension and medication noncompliance.  Presented to the emergency department at [facility] 2/9 with c/o headache and left-sided weakness.  STAT CT Head demonstrated right thalamic/basal ganglia hemorrhage with intraventricular extension.  She was transferred to [facility] emergency department for in person neurology evaluation. There was evidence of obstructive hydrocephalus and neurosurgery was consulted. External ventricular drain was placed 2/10. In the hours following she became increasingly obtunded. PCCM was consulted. Upon PCCM arrival the patient became apneic and was intubated.  Past Medical History:  HTN, medication noncompliance  Significant Hospital Events:  2/09 Admit for ICH 2/10 EVD placed, later intubated due to obtundation and respiratory arrest 2/11 MRI Brain, hypertonic saline d/c'd, EVD not working.  Husband did not consent to PICC   Consults:  Stroke PCCM Neurosurgery  Procedures:  ETT 2/10 >> EVD 2/10 >> non functioning 2/11 at 1500; clamped 2/12 am >>  Significant Diagnostic Tests:   CT Head 2/9 >> Acute hemorrhage with the epicenter in the right thalamus. Measurement is approximately 3.2 x 2.1 x 2.5 cm (volume 8.8 cm^3). Intraventricular penetration with blood filling the third and fourth ventricles and nearly filling the right lateral ventricle. No hydrocephalus.  CTA Head/Neck 2/10 >> Unchanged size of intraparenchymal hemorrhage centered in the right thalamus and basal ganglia with extension into the right  lateral ventricle. 2. No intracranial arterial occlusion or high-grade stenosis. No dissection, aneurysm or hemodynamically significant stenosis of the carotid or vertebral arteries.  CT Head 2/10 >> Stable parenchymal hemorrhage in the right thalamus and basal ganglia with intraventricular extension as described. No new focal abnormality is noted.  CT Head 2/10 (post intubation) >> redemonstrated intraparenchymal hemorrhage centered within the R thalamus/basal ganglia extending into the cerebral peduncle/midbrain with associated intraventricular extension and hemorrhage in the R lateral ventricle and fourth ventricle, overall not significantly changed from prior; stable mass effect, 54mm of R to L midline shift, postsurgical changes of ventriculostomy TTE 2/10 >> 1. Left ventricular ejection fraction, by estimation, is 60 to 65%. The  left ventricle has normal function. The left ventricle has no regional  wall motion abnormalities. There is mild concentric left ventricular hypertrophy. Left ventricular diastolic parameters were normal.  2. Right ventricular systolic function is normal. The right ventricular size is normal. There is normal pulmonary artery systolic pressure.  3. The mitral valve is normal in structure. Trivial mitral valve regurgitation. No evidence of mitral stenosis.  4. The aortic valve is tricuspid. Aortic valve regurgitation is not visualized. No aortic stenosis is present.  5. The inferior vena cava is normal in size with <50% respiratory variability, suggesting right atrial pressure of 8 mmHg.   MRA head 2/11 >>Negative intracranial MR angiography. No evidence of aneurysm or high flow vascular malformation to explain the intraparenchymal hemorrhage   MR brain 2/11 >> 1. When comparing across modalities, similar intraparenchymal hemorrhage centered within the right thalamus and basal ganglia extending into the right midbrain with intraventricular extension. There is  effacement of the prepontine cistern and similar 4 mm of leftward midline shift. 2. While  acute blood products limits evaluation, there are separate areas of restricted diffusion and edema involving the right greater than left mesial temporal lobes/parahippocampal cortex, bilateral basal ganglia, and possibly the upper pons that are concerning for acute infarcts, most likely secondary to mass effect from the hemorrhage. 3. While concerning for infarcts, some of these areas (particularly the right parahippocampal and the splenium of the corpus callosum) could also be in part secondary to drugs, seizures, or metabolic derangement. 4. Similar size of the ventricular system with right frontal ventriculostomy catheter in place. Similar rounding of the temporal horns, which warrants attention on follow-up.  Micro Data:  2/9 COVID + Influenza negative 2/10 MRSA PCR negative 2/12 trach asp >> 2/12 BCx 2 >>  Antimicrobials:  2/12 cefepime >> 2/12 vanc >>  Interim History / Subjective:  Pending CTH this am  EVD remains clamped  tmax 99.2/ WBC 14.4 Hgb 9.4-> 8.1 No events overnight, remains on low dose precedex for severe elevations/ fluctuations in HR/ BP with stimulation   Objective   Blood pressure 127/65, pulse 68, temperature 99.2 F (37.3 C), temperature source Oral, resp. rate 18, height 5\' 5"  (1.651 m), weight 63.5 kg, last menstrual period 03/03/2014, SpO2 100 %, unknown if currently breastfeeding.    Vent Mode: PRVC FiO2 (%):  [30 %] 30 % Set Rate:  [14 bmp] 14 bmp Vt Set:  [450 mL] 450 mL PEEP:  [5 cmH20] 5 cmH20 Plateau Pressure:  [12 cmH20-15 cmH20] 15 cmH20   Intake/Output Summary (Last 24 hours) at 08/30/2020 09/01/2020 Last data filed at 08/30/2020 0600 Gross per 24 hour  Intake 2629.79 ml  Output 1200 ml  Net 1429.79 ml   Filed Weights   08/26/20 2237 08/29/20 0500 08/30/20 0500  Weight: 61.2 kg 64.1 kg 63.5 kg    Examination: General:  Critically ill adult  female in NAD intubated on MV HEENT: MM pink/moist, left pupils pinpoint, right 4, both non reactive, brisk corneals, ETT/ OGT, right frontal EVD- clamped Neuro:  Extensor posturing LUE, attempts to local in RUE, triple reflex in LE, not f/c or opens eyes CV: rr, NSR, no murmur PULM:  Non labored, CTA, scant secretions, no wheeze, will breath over vent GI: soft, bs active, foley- cyu Extremities: warm/dry, no LE edema  Skin: no rashes   UOP 1200 ml/ 24hr, ~0.8 ml/kg/hr +1.4/ net +4.1L  CXR 2/13 reviewed, stable lines, no acute process Labs reviewed, sCr stable, K 3.2, Na 155  Resolved Hospital Problem list     Assessment & Plan:   Intra-parenchymal hemorrhage with R thalamic IPH with extension into the third, forth, and R lateral ventricle.  Obstructive hydrocephalus Hypertensive emergency S/p R thalamic ICH with resultant obstructive hydrocephalus. Underwent EVD placement 2/10 and became increasingly obtunded post-procedure. Follow up CT Head grossly unchanged with stable appearance of ICH.  EVD non functioning as of 2/11 ~1500 - Management per Stroke service and Neurosurgery - Goal SBP < 160, prn clevidipine / labetalol prn  - repeat CTH this am.  EVD remains clamped, probable d/c today  - serial neuro exams - prognosis guarded   Acute hypoxemic respiratory failure secondary to above -Continue MV support, 8cc/kg IBW with goal Pplat <30 and DP<15  -VAP prevention protocol/ PPI -PAD protocol for sedation> prn fentanyl, low dose precedex given HR/ BP fluctuations, RASS goal 0/-1  -wean FiO2 as able for SpO2 >92%  -daily SAT & SBT -may need early trach given guarded prognosis   AKI- resolving Hypokalemia/ hypophos Hypernatremia iatrogenic s/p  hypertonic saline - KCL 40 meq w/ Mag 2gm x 1 - UOP remains adequate, stop IVF, net +4L - Trend BMP / urinary output - Replace electrolytes as indicated - Avoid nephrotoxic agents, ensure adequate renal perfusion  Inadequate caloric  intake - continue TF  Leukocytosis - Tmax 99.2, WBC stable ~14.4 - CXR 2/13 clear, UA neg - PCT 0.14-> 0.10 - follow cultures  - could be neurogenic, continue empiric abx for now, will discuss with attending about stopping given reassuring PCT  Microcytic Anemia  -Hgb trend 11.7-> 9-> 9.4-> 8.1 -No obvious signs of bleeding - stop heparin SQ for now -Recheck H/H 1400 -T&S, transfuse for Hgb < 7 -Anemia panel in am   Best practice (evaluated daily)  Diet: NPO; TF  Pain/Anxiety/Delirium protocol (if indicated): prn fentanyl/ adding low dose precedex VAP protocol (if indicated): In place DVT prophylaxis: SCDs  GI prophylaxis: PPI Glucose control: CBG q 4, SSI sensitive Mobility: BR Disposition: ICU  Goals of Care:  Last date of multidisciplinary goals of care discussion: Per primary Family and staff present:  Summary of discussion:  Follow up goals of care discussion due: per primary, Patient has 4 adult children from prior marriage and two teenage children in current marriage.  Possibly some discord between the two. Recommend PMT consult as patient's husband refused PICC placement on 2/11 and since has only requested to speak to Dr. [name] on Monday regarding GOC.  Code Status: FULL  Critical care time: 30 minutes    Posey Boyer, ACNP [facility] 08/30/2020, 7:22 AM

## 2020-08-30 NOTE — Progress Notes (Signed)
SLP Cancellation Note  Patient Details Name: JANIYA MILLIRONS MRN: [id number] DOB: 1965/11/06   Cancelled treatment:       Reason Eval/Treat Not Completed: Patient not medically ready (Pt remains on the vent at this time. SLP will f/u)  Yvone Neu I. Vear Clock, MS, CCC-SLP Acute Rehabilitation Services Office number [phone] Pager [phone]  Scheryl Marten 08/30/2020, 8:02 AM

## 2020-08-30 NOTE — Progress Notes (Signed)
  NEUROSURGERY PROGRESS NOTE   No issues overnight.   EXAM:  BP 127/65   Pulse 68   Temp 99.2 F (37.3 C) (Oral)   Resp 18   Ht 5\' 5"  (1.651 m)   Wt 63.5 kg   LMP 03/03/2014   SpO2 100%   BMI 23.30 kg/m   Intubated, requires some sedation No eye opening to pain Right pupil non-reactive, mid-position Flexion posturing LUE, flexion w/d RUE EVD in place, non-functional  IMAGING: CTH reviewed demonstrates stable size of ventricular system in comparison to MRI of 2/11. No significant increase in size of hemorrhage.  IMPRESSION:  55 y.o. female s/p spontaneous right thalamic/midbrain/pontine hemorrhage. EVD non-functional over last 2 days with stable ventricular size on imaging. Given current exam and location of hemorrhage suspect overall prognosis is very poor.  PLAN: - Will d/c EVD today - Cont current mgmt per PCCM/neurology  40, MD [facility]

## 2020-08-31 DIAGNOSIS — Z7189 Other specified counseling: Secondary | ICD-10-CM

## 2020-08-31 LAB — CBC
HCT: 28.1 % — ABNORMAL LOW (ref 36.0–46.0)
Hemoglobin: 8.5 g/dL — ABNORMAL LOW (ref 12.0–15.0)
MCH: 24 pg — ABNORMAL LOW (ref 26.0–34.0)
MCHC: 30.2 g/dL (ref 30.0–36.0)
MCV: 79.4 fL — ABNORMAL LOW (ref 80.0–100.0)
Platelets: 171 10*3/uL (ref 150–400)
RBC: 3.54 MIL/uL — ABNORMAL LOW (ref 3.87–5.11)
RDW: 15.9 % — ABNORMAL HIGH (ref 11.5–15.5)
WBC: 13 10*3/uL — ABNORMAL HIGH (ref 4.0–10.5)
nRBC: 0 % (ref 0.0–0.2)

## 2020-08-31 LAB — BASIC METABOLIC PANEL
Anion gap: 9 (ref 5–15)
BUN: 24 mg/dL — ABNORMAL HIGH (ref 6–20)
CO2: 25 mmol/L (ref 22–32)
Calcium: 9.1 mg/dL (ref 8.9–10.3)
Chloride: 120 mmol/L — ABNORMAL HIGH (ref 98–111)
Creatinine, Ser: 1.13 mg/dL — ABNORMAL HIGH (ref 0.44–1.00)
GFR, Estimated: 58 mL/min — ABNORMAL LOW (ref >60)
Glucose, Bld: 130 mg/dL — ABNORMAL HIGH (ref 70–99)
Potassium: 3.6 mmol/L (ref 3.5–5.1)
Sodium: 154 mmol/L — ABNORMAL HIGH (ref 135–145)

## 2020-08-31 LAB — IRON AND TIBC
Iron: 15 ug/dL — ABNORMAL LOW (ref 28–170)
Saturation Ratios: 7 % — ABNORMAL LOW (ref 10.4–31.8)
TIBC: 210 ug/dL — ABNORMAL LOW (ref 250–450)
UIBC: 195 ug/dL

## 2020-08-31 LAB — SODIUM
Sodium: 152 mmol/L — ABNORMAL HIGH (ref 135–145)
Sodium: 153 mmol/L — ABNORMAL HIGH (ref 135–145)
Sodium: 152 mmol/L — ABNORMAL HIGH (ref 135–145)

## 2020-08-31 LAB — FERRITIN: Ferritin: 258 ng/mL (ref 11–307)

## 2020-08-31 LAB — VITAMIN B12: Vitamin B-12: 301 pg/mL (ref 180–914)

## 2020-08-31 LAB — PHOSPHORUS: Phosphorus: 2.4 mg/dL — ABNORMAL LOW (ref 2.5–4.6)

## 2020-08-31 LAB — FOLATE: Folate: 9.8 ng/mL (ref >5.9)

## 2020-08-31 LAB — RETICULOCYTES
Immature Retic Fract: 14.2 % (ref 2.3–15.9)
RBC.: 3.4 MIL/uL — ABNORMAL LOW (ref 3.87–5.11)
Retic Count, Absolute: 19 10*3/uL (ref 19.0–186.0)
Retic Ct Pct: 0.6 % (ref 0.4–3.1)

## 2020-08-31 LAB — GLUCOSE, CAPILLARY
Glucose-Capillary: 133 mg/dL — ABNORMAL HIGH (ref 70–99)
Glucose-Capillary: 140 mg/dL — ABNORMAL HIGH (ref 70–99)
Glucose-Capillary: 155 mg/dL — ABNORMAL HIGH (ref 70–99)
Glucose-Capillary: 166 mg/dL — ABNORMAL HIGH (ref 70–99)
Glucose-Capillary: 171 mg/dL — ABNORMAL HIGH (ref 70–99)
Glucose-Capillary: 172 mg/dL — ABNORMAL HIGH (ref 70–99)

## 2020-08-31 LAB — TRIGLYCERIDES: Triglycerides: 114 mg/dL (ref <150)

## 2020-08-31 LAB — MAGNESIUM: Magnesium: 2.3 mg/dL (ref 1.7–2.4)

## 2020-08-31 LAB — PROCALCITONIN: Procalcitonin: 0.17 ng/mL

## 2020-08-31 MED ORDER — SODIUM CHLORIDE 0.9 % IV SOLN: INTRAVENOUS | Status: DC | PRN

## 2020-08-31 NOTE — Progress Notes (Signed)
Patient ID: Charlene [name], female   DOB: 03-20-1966, 55 y.o.   MRN: [id number]  Medical records reviewed   55 year old female with past medical history as below, which is significant for hypertension and reported medication noncompliance.  Presented to the emergency department at [facility] 2/9 with c/o headache and left-sided weakness.  STAT CT Head demonstrated right thalamic/basal ganglia hemorrhage with intraventricular extension.  She was transferred to [facility] emergency department for in person neurology evaluation. There was evidence of obstructive hydrocephalus and neurosurgery was consulted. External ventricular drain was placed 2/10. In the hours following she became increasingly obtunded. PCCM was consulted. Upon PCCM arrival the patient became apneic and was intubated. EVD removed 2/13  Today is day 4 of this hospitalization.  Patient remains intubated/respiratory failure, with extensor posturing, unresponsive to gentle touch and verbal stimuli.  This NP assessed patient and discussed with bedside RN and Dr Pearlean Brownie  Meeting as scheduled with husband/ [name] and son/ Diante for conversation regarding current medical situation.A discussion was had regarding current medical situation, diagnosis, prognosis, goals of care, end-of-life wishes, and options.   Created space and opportunity for family to explore the thoughts and feelings regarding patient's current medical situation.  Patient's husband shares the complex dynamics of their family situation and the importance of careful communication. The son had many questions regarding patient's current medical situation, however when I initiated education regarding best case scenario versus worst-case scenario for this patient, at this time in this situation, her son verbalized that he did not want "any negativity brought into the room.   Emotional support and therapeutic listening.  Discussed with husband  the importance of continued conversation  with the family and the  medical providers regarding overall plan of care and treatment options,  ensuring decisions are within the context of the patients values and GOCs.  Questions and concerns addressed   Discussed with bedside RN and Dr Pearlean Brownie and Dr Mikal Plane via secure chat  Total time spent on the unit was 35 minutes   PMT will continue to support holisticaly  Greater than 50% of the time was spent in counseling and coordination of care  Lorinda Creed NP  Palliative Medicine Team Team Phone # [phone] Pager [phone]

## 2020-08-31 NOTE — Progress Notes (Signed)
SLP Cancellation Note  Patient Details Name: Charlene [name] MRN: [id number] DOB: 08-22-1965   Cancelled treatment:       Reason Eval/Treat Not Completed: Patient not medically ready. Pt remains intubated and poorly responsive per notes. Will discontinue orders at this time. Please reorder if pt becomes appropriate for SLP interventions   Bartosz Luginbill, Riley Nearing 08/31/2020, 7:53 AM

## 2020-08-31 NOTE — Progress Notes (Signed)
Physical Therapy Treatment Patient Details Name: Charlene [name] MRN: [id number] DOB: Mar 06, 1966 Today's Date: 08/31/2020    History of Present Illness 55 yo female presents to [facility] on 2/9 with L sided weakness and headaches. PMH includes anxiety, HTN. 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 s/p Right frontal ventricular catheter placement on 2/10, stopped functioning on 2/11.  MRI 2/11 shows right greater than left mesial temporal lobes / parahippocampal cortex, bilateral basal ganglia, and possibly the upper pons that are concerning for acute infarcts, most likely secondary to mass effect from the hemorrhage. ETT 2/10-present.    PT Comments    Pt lethargic for duration of session, PT and OT with goal of sitting pt EOB to possibly activate reticular formation and increase pt arousal. Pt responding to noxious stimuli only this session, and presents with extensor posturing UEs>LEs bilaterally. Pt requiring total +2 for to and from EOB and repositioning, pt with no periods of eye opening or interaction. Pt with no increased wakefulness sitting EOB vs supine. PT to continue to follow.   Follow Up Recommendations  SNF;Supervision/Assistance - 24 hour     Equipment Recommendations  Wheelchair (measurements PT);Wheelchair cushion (measurements PT);Hospital bed    Recommendations for Other Services       Precautions / Restrictions Precautions Precautions: Fall Precaution Comments: No hand mitts today Restrictions Weight Bearing Restrictions: No    Mobility  Bed Mobility Overal bed mobility: Needs Assistance Bed Mobility: Supine to Sit;Sit to Supine     Supine to sit: Total assist;+2 for physical assistance Sit to supine: Total assist;+2 for physical assistance   General bed mobility comments: upon sitting EOB pt drooling, when [place] was  used pt went into extensor posture requiring bilateral LE flexion by PT and hip flexion by OT to break.    Transfers                 General transfer comment: unable  Ambulation/Gait                 Stairs             Wheelchair Mobility    Modified Rankin (Stroke Patients Only) Modified Rankin (Stroke Patients Only) Pre-Morbid Rankin Score: No symptoms Modified Rankin: Severe disability     Balance Overall balance assessment: Needs assistance Sitting-balance support: No upper extremity supported;Feet supported Sitting balance-Leahy Scale: Zero Sitting balance - Comments: intermittent extensor posture, especially with noxious or unexpected stimuli Postural control: Posterior lean                                  Cognition Arousal/Alertness: Lethargic Behavior During Therapy: Flat affect Overall Cognitive Status: Difficult to assess                                        Exercises      General Comments General comments (skin integrity, edema, etc.): HR 70s-126 bpm      Pertinent Vitals/Pain Pain Assessment:  (withdrawals to pain x4 extremities) Pain Location: withdraws to pain x 4 extremities Pain Intervention(s): Limited activity within patient's tolerance;Monitored during session;Repositioned    Home Living Family/patient expects to be discharged to:: Skilled nursing facility Living Arrangements: Spouse/significant other;Children (2 teenage  kids and spouse) Available Help at Discharge: Family                Prior Function Level of Independence: Independent      Comments: unsure of home set up, no family present in room and pt not verbalizing   PT Goals (current goals can now be found in the care plan section) Acute Rehab PT Goals Patient Stated Goal: unable to (intubated) PT Goal Formulation: Patient unable to participate in goal setting Time For Goal Achievement: 09/13/20 Potential to Achieve  Goals: Poor Progress towards PT goals: Not progressing toward goals - comment (lethargy, extensor posturing only)    Frequency    Min 2X/week      PT Plan Current plan remains appropriate    Co-evaluation PT/OT/SLP Co-Evaluation/Treatment: Yes Reason for Co-Treatment: For patient/therapist safety;Necessary to address cognition/behavior during functional activity PT goals addressed during session: Mobility/safety with mobility;Balance;Strengthening/ROM OT goals addressed during session: Strengthening/ROM      AM-PAC PT "6 Clicks" Mobility   Outcome Measure  Help needed turning from your back to your side while in a flat bed without using bedrails?: Total Help needed moving from lying on your back to sitting on the side of a flat bed without using bedrails?: Total Help needed moving to and from a bed to a chair (including a wheelchair)?: Total Help needed standing up from a chair using your arms (e.g., wheelchair or bedside chair)?: Total Help needed to walk in hospital room?: Total Help needed climbing 3-5 steps with a railing? : Total 6 Click Score: 6    End of Session Equipment Utilized During Treatment: Oxygen (via vent) Activity Tolerance: Treatment limited secondary to medical complications (Comment) Patient left: in bed;with call bell/phone within reach;with nursing/sitter in room;with bed alarm set Nurse Communication: Mobility status PT Visit Diagnosis: Other abnormalities of gait and mobility (R26.89);Other symptoms and signs involving the nervous system (R29.898)     Time: 1638-4665 PT Time Calculation (min) (ACUTE ONLY): 22 min  Charges:  $Therapeutic Activity: 8-22 mins                    Marye Round, PT Acute Rehabilitation Services Pager [phone]  Office [phone]  Tyrone Apple E Christain [place] 08/31/2020, 3:05 PM

## 2020-08-31 NOTE — Evaluation (Signed)
Occupational Therapy Evaluation Patient Details Name: Charlene [name] MRN: [id number] DOB: July 26, 1965 Today's Date: 08/31/2020    History of Present Illness 55 yo female presents to [facility] on 2/9 with L sided weakness and headaches. PMH includes anxiety, HTN. 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 s/p Right frontal ventricular catheter placement on 2/10, stopped functioning on 2/11.  MRI 2/11 shows right greater than left mesial temporal lobes / parahippocampal cortex, bilateral basal ganglia, and possibly the upper pons that are concerning for acute infarcts, most likely secondary to mass effect from the hemorrhage. ETT 2/10-present.   Clinical Impression   This 55 yo female admitted with above presents to acute OT with PLOF of being totally independent and very active per chart. Currently she is total A for all basic ADLs, displaying extensor posture, no eye opening noted, general reaction (extensor posture) to pain at upper arm and thighs (no response noted to nail bed pressure x4 extremities). She will continue to benefit from acute OT with follow up at SNF.    Follow Up Recommendations  SNF;Supervision/Assistance - 24 hour    Equipment Recommendations  Other (comment) (TBD next venue)       Precautions / Restrictions Precautions Precautions: Fall Precaution Comments: No hand mitts today Restrictions Weight Bearing Restrictions: No      Mobility Bed Mobility Overal bed mobility: Needs Assistance Bed Mobility: Supine to Sit;Sit to Supine     Supine to sit: Total assist;+2 for physical assistance Sit to supine: Total assist;+2 for physical assistance   General bed mobility comments: upon sitting EOB pt drolling, when [place] used pt went into extensor posture       Balance Overall balance assessment: Needs assistance Sitting-balance  support: No upper extremity supported;Feet supported Sitting balance-Leahy Scale: Zero Sitting balance - Comments: intermittent extensor posture                                   ADL either performed or assessed with clinical judgement   ADL                                         General ADL Comments: total A     Vision   Additional Comments: Eyes shut entire session, right eye no pupillary reaction to light, left eye pinpoint pupil            Pertinent Vitals/Pain Pain Assessment:  (withdraws to pain x 4 extremities) Pain Location: withdraws to pain x 4 extremities Pain Intervention(s): Monitored during session;Limited activity within patient's tolerance     Hand Dominance Right   Extremity/Trunk Assessment Upper Extremity Assessment RUE Deficits / Details: Full PROM when at rest, extensor tone with stimulation LUE Deficits / Details: Full PROM when at rest, extensor tone with noxious stimulation           Communication Communication Communication:  (intubated)   Cognition Arousal/Alertness: Lethargic Behavior During Therapy: Flat affect Overall Cognitive Status: Difficult to assess                                     General Comments  Pt with HR high  60's/low 70's at beginning, up to 126 when up to EOB            Home Living Family/patient expects to be discharged to:: Skilled nursing facility Living Arrangements: Spouse/significant other;Children (2 teenage kids and spouse) Available Help at Discharge: Family                                    Prior Functioning/Environment Level of Independence: Independent        Comments: unsure of home set up, no family present in room and pt not verbalizing        OT Problem List: Decreased activity tolerance;Impaired balance (sitting and/or standing);Decreased coordination;Decreased cognition;Impaired UE functional use;Impaired tone      OT  Treatment/Interventions: Self-care/ADL training;Therapeutic exercise;Therapeutic activities;Neuromuscular education;Cognitive remediation/compensation;Visual/perceptual remediation/compensation;DME and/or AE instruction;Patient/family education;Balance training    OT Goals(Current goals can be found in the care plan section) Acute Rehab OT Goals Patient Stated Goal: unable to (intubated) OT Goal Formulation: Patient unable to participate in goal setting Time For Goal Achievement: 09/14/20 Potential to Achieve Goals: Fair  OT Frequency: Min 2X/week           Co-evaluation PT/OT/SLP Co-Evaluation/Treatment: Yes Reason for Co-Treatment: For patient/therapist safety;Necessary to address cognition/behavior during functional activity PT goals addressed during session: Mobility/safety with mobility;Balance;Strengthening/ROM OT goals addressed during session: Strengthening/ROM      AM-PAC OT "6 Clicks" Daily Activity     Outcome Measure Help from another person eating meals?: Total Help from another person taking care of personal grooming?: Total Help from another person toileting, which includes using toliet, bedpan, or urinal?: Total Help from another person bathing (including washing, rinsing, drying)?: Total Help from another person to put on and taking off regular upper body clothing?: Total Help from another person to put on and taking off regular lower body clothing?: Total 6 Click Score: 6   End of Session Nurse Communication:  (how pt responded to session of sitting on EOB)  Activity Tolerance: Patient limited by lethargy Patient left: in bed;with bed alarm set  OT Visit Diagnosis: Other abnormalities of gait and mobility (R26.89);Low vision, both eyes (H54.2);Other symptoms and signs involving the nervous system (R29.898);Other symptoms and signs involving cognitive function;Cognitive communication deficit (R41.841);Hemiplegia and hemiparesis Symptoms and signs involving  cognitive functions: Other Nontraumatic ICH Hemiplegia - Right/Left:  (both) Hemiplegia - caused by: Other Nontraumatic intracranial hemorrhage                Time: 7681-1572 OT Time Calculation (min): 22 min Charges:  OT General Charges $OT Visit: 1 Visit OT Evaluation $OT Eval Moderate Complexity: 1 Mod  Ignacia Palma, OTR/L Acute [employer] Pager [phone] Office [phone]    Evette Georges 08/31/2020, 11:58 AM

## 2020-08-31 NOTE — Progress Notes (Signed)
NAME:  Charlene [name], MRN:  [id number], DOB:  April 27, 1966, LOS: 4 ADMISSION DATE:  08/26/2020, CONSULTATION DATE:  2/10 REFERRING MD:  Dr. 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  On 2/10 she had acute mental status change to obtundation and respiratory arrest resulting in intubation.  History of Present Illness:  55 year old female with past medical history as below, which is significant for hypertension and medication noncompliance.  Presented to the emergency department at [facility] 2/9 with c/o headache and left-sided weakness.  STAT CT Head demonstrated right thalamic/basal ganglia hemorrhage with intraventricular extension.  She was transferred to [facility] emergency department for in person neurology evaluation. There was evidence of obstructive hydrocephalus and neurosurgery was consulted. External ventricular drain was placed 2/10. In the hours following she became increasingly obtunded. PCCM was consulted. Upon PCCM arrival the patient became apneic and was intubated. EVD removed 2/13  Past Medical History:  HTN, medication noncompliance  Significant Hospital Events:  2/09 Admit for ICH 2/10 EVD placed, later intubated due to obtundation and respiratory arrest 2/11 MRI Brain, hypertonic saline d/c'd, EVD not working.  Husband did not consent to PICC   Consults:  Stroke PCCM Neurosurgery  Procedures:  ETT 2/10 >> EVD 2/10 >> non functioning 2/11 at 1500; clamped 2/12 am >>  Significant Diagnostic Tests:   CT Head 2/9 >> Acute hemorrhage with the epicenter in the right thalamus. Measurement is approximately 3.2 x 2.1 x 2.5 cm (volume 8.8 cm^3). Intraventricular penetration with blood filling the third and fourth ventricles and nearly filling the right lateral ventricle. No hydrocephalus.  CTA Head/Neck 2/10 >> Unchanged size of intraparenchymal hemorrhage centered in the right thalamus and basal ganglia with  extension into the right lateral ventricle. 2. No intracranial arterial occlusion or high-grade stenosis. No dissection, aneurysm or hemodynamically significant stenosis of the carotid or vertebral arteries.  CT Head 2/10 >> Stable parenchymal hemorrhage in the right thalamus and basal ganglia with intraventricular extension as described. No new focal abnormality is noted.  CT Head 2/10 (post intubation) >> redemonstrated intraparenchymal hemorrhage centered within the R thalamus/basal ganglia extending into the cerebral peduncle/midbrain with associated intraventricular extension and hemorrhage in the R lateral ventricle and fourth ventricle, overall not significantly changed from prior; stable mass effect, 93mm of R to L midline shift, postsurgical changes of ventriculostomy TTE 2/10 >> 1. Left ventricular ejection fraction, by estimation, is 60 to 65%. The  left ventricle has normal function. The left ventricle has no regional  wall motion abnormalities. There is mild concentric left ventricular hypertrophy. Left ventricular diastolic parameters were normal.  2. Right ventricular systolic function is normal. The right ventricular size is normal. There is normal pulmonary artery systolic pressure.  3. The mitral valve is normal in structure. Trivial mitral valve regurgitation. No evidence of mitral stenosis.  4. The aortic valve is tricuspid. Aortic valve regurgitation is not visualized. No aortic stenosis is present.  5. The inferior vena cava is normal in size with <50% respiratory variability, suggesting right atrial pressure of 8 mmHg.   MRA head 2/11 >>Negative intracranial MR angiography. No evidence of aneurysm or high flow vascular malformation to explain the intraparenchymal hemorrhage   MR brain 2/11 >> 1. When comparing across modalities, similar intraparenchymal hemorrhage centered within the right thalamus and basal ganglia extending into the right midbrain with intraventricular  extension. There is effacement of the prepontine cistern and similar 4 mm of leftward midline  shift. 2. While acute blood products limits evaluation, there are separate areas of restricted diffusion and edema involving the right greater than left mesial temporal lobes/parahippocampal cortex, bilateral basal ganglia, and possibly the upper pons that are concerning for acute infarcts, most likely secondary to mass effect from the hemorrhage. 3. While concerning for infarcts, some of these areas (particularly the right parahippocampal and the splenium of the corpus callosum) could also be in part secondary to drugs, seizures, or metabolic derangement. 4. Similar size of the ventricular system with right frontal ventriculostomy catheter in place. Similar rounding of the temporal horns, which warrants attention on follow-up.  Micro Data:  2/9 COVID + Influenza negative 2/10 MRSA PCR negative 2/12 trach asp >> 2/12 BCx 2 >>  Antimicrobials:  2/12 cefepime >> 2/12 vanc >>  Interim History / Subjective:  CTH-13th-worse edema tmax 100.5/ WBC 13.0 Hgb 8.5 Off sedation  Objective   Blood pressure 125/66, pulse 64, temperature (!) 102.8 F (39.3 C), temperature source Axillary, resp. rate 16, height 5\' 5"  (1.651 m), weight 63.5 kg, last menstrual period 03/03/2014, SpO2 100 %, unknown if currently breastfeeding.    Vent Mode: PRVC FiO2 (%):  [30 %] 30 % Set Rate:  [14 bmp] 14 bmp Vt Set:  [450 mL] 450 mL PEEP:  [5 cmH20] 5 cmH20 Plateau Pressure:  [10 cmH20-14 cmH20] 14 cmH20   Intake/Output Summary (Last 24 hours) at 08/31/2020 0830 Last data filed at 08/31/2020 0800 Gross per 24 hour  Intake 2679.66 ml  Output 1400 ml  Net 1279.66 ml   Filed Weights   08/26/20 2237 08/29/20 0500 08/30/20 0500  Weight: 61.2 kg 64.1 kg 63.5 kg    Examination: General: Critically ill, on vent, minimal support.  HEENT: Moist oral mucosa, endotracheal tube in place, right pupil  nonreactive Neuro: Extensor posturing left upper extremity CV: S1-S2 appreciated PULM: Clear breath sounds GI: Bowel sounds appreciated Extremities: Warm and dry Skin: No rashes  Sodium 154, BUN/creatinine-24/1.13  CXR 2/13 reviewed-ET tube stable, no acute infiltrate  Resolved Hospital Problem list     Assessment & Plan:  Intraparenchymal hemorrhage with right thalamic hemorrhage with extension into the third fourth and right lateral ventricle Obstructive hydrocephalus Hypertensive emergency -EVD to 10 2-13 -Management per stroke service and neurosurgery -Goal SBP less than 160 with Cleviprex/labetalol as needed -Serial neuro exams -Prognosis is guarded, with worsening CT scan findings prognosis for meaningful recovery is poor  Acute hypoxemic respiratory failure secondary to CVA -Continue mechanical ventilator support -VAP prevention -Off sedation at present -SBT -Keep head of bed elevated> 30 degrees -She will require a trach when prognosis of above condition is considered  Acute kidney injury-Resolving Electrolyte derangements Hypernatremia-hypertonic saline -Continue to trend BMP -Avoid nephrotoxic agents -Maintain renal perfusion  Nutrition -Continue tube feeds   Inadequate caloric intake - continue TF  Fever/leukocytosis -This is likely related to central fever -Will stop antibiotics following today's doses if cultures remain negative   Microcytic Anemia  -Transfuse per protocol -Continue to trend H&H   Best practice (evaluated daily)  Diet: Tube feeds Pain/Anxiety/Delirium protocol (if indicated): prn fentanyl/ adding low dose precedex VAP protocol (if indicated): In place DVT prophylaxis: SCDs GI prophylaxis: PPI Glucose control: CBG q 4, SSI sensitive Mobility: Bedrest Disposition: ICU  Goals of Care:  Last date of multidisciplinary goals of care discussion: Per primary Family and staff present:  Summary of discussion:  Follow up goals  of care discussion due: per primary, palliative care involved.  Patient has 4  adult children from prior marriage and two teenage children in current marriage.  Possibly some discord between the two. Recommend PMT consult as patient's husband refused PICC placement on 2/11 and since has only requested to speak to Dr. [name] on Monday regarding GOC.  Code Status: FULL  The patient is critically ill with multiple organ systems failure and requires high complexity decision making for assessment and support, frequent evaluation and titration of therapies, application of advanced monitoring technologies and extensive interpretation of multiple databases. Critical Care Time devoted to patient care services described in this note independent of APP/resident time (if applicable)  is 30 minutes.   Virl Diamond MD [facility] Personal pager: [phone] If unanswered, please page CCM On-call: #[phone]

## 2020-08-31 NOTE — Progress Notes (Signed)
STROKE TEAM PROGRESS NOTE   INTERVAL HISTORY No acute events overnight.  Neuro neurological exam seems slightly worse with now bilateral extensor posturing. Pt is examined at the bedside. No family present in the room. She remains intubated for respiratory failure with extensor posturing on B/L UE, eyes closed, doesn't not open with voice or pain.  Hypotropia in Left eye, right eye mid position, left pupil pinpoint, right pupil 3 mm and fixed, doesn't react to light. Corneal reflex brisk b/l. Mild Left facial droop.  Serum sodium is 154 and she has been off hypertonic saline.  White count is downtrend at 13.0.  Currently Afebrile but had fever of 102.8 at 400 today and 100.5 at 800. Pt is on Vanco and Cefipime. Cultures pending but negative until now.Vitamin B12 301,  PT/OT recommends SNF. Goals of care discussed with family by Attending and Palliative care over the weekend. Palliative care on board and following. Husband stated he need more time to make decision. Pt also has grown up children.   Vitals:   08/31/20 1020 08/31/20 1100 08/31/20 1135 08/31/20 1200  BP: (!) 154/73 133/71  131/66  Pulse: 87 69  70  Resp: (!) '22 18  19  ' Temp:    98.3 F (36.8 C)  TempSrc:    Axillary  SpO2: 100% 100% 100% 100%  Weight:      Height:       CBC:  Recent Labs  Lab 08/26/20 2205 08/27/20 2020 08/30/20 0349 08/30/20 1501 08/31/20 0411  WBC 8.3   < > 14.4*  --  13.0*  NEUTROABS 3.5  --   --   --   --   HGB 11.7*   < > 8.1* 7.8* 8.5*  HCT 37.3   < > 27.3* 25.4* 28.1*  MCV 76.6*   < > 81.3  --  79.4*  PLT 304   < > 162  --  171   < > = values in this interval not displayed.   Basic Metabolic Panel:  Recent Labs  Lab 08/29/20 1617 08/29/20 2114 08/30/20 0349 08/30/20 0858 08/31/20 0411 08/31/20 0941  NA 155*   < > 155*   < > 154* 152*  K  --   --  3.2*  --  3.6  --   CL  --   --  120*  --  120*  --   CO2  --   --  23  --  25  --   GLUCOSE  --   --  158*  --  130*  --   BUN  --   --   27*  --  24*  --   CREATININE  --   --  1.13*  --  1.13*  --   CALCIUM  --   --  8.7*  --  9.1  --   MG 1.9  --   --   --  2.3  --   PHOS 3.2  --   --   --  2.4*  --    < > = values in this interval not displayed.   Lipid Panel:  Recent Labs  Lab 08/27/20 0251 08/28/20 0326 08/31/20 0411  CHOL 265*  --   --   TRIG 129   < > 114  HDL 72  --   --   CHOLHDL 3.7  --   --   VLDL 26  --   --   LDLCALC 167*  --   --    < > =  values in this interval not displayed.   HgbA1c:  Recent Labs  Lab 08/27/20 0251  HGBA1C 6.0*   Urine Drug Screen:  Recent Labs  Lab 08/27/20 1854  [place] NONE DETECTED  LABBENZ NONE DETECTED  AMPHETMU NONE DETECTED  THCU NONE DETECTED  LABBARB NONE DETECTED    Alcohol Level  Recent Labs  Lab 08/26/20 2225  ETH <10    IMAGING past 24 hours No results found.  PHYSICAL EXAM GENERAL: Well nourished, well developed middle-aged African-American lady. Intubated but not sedated.  HEENT: - Normocephalic and atraumatic.  LUNGS - On Ventilator CV - no JVD, No Peripheral Edema, Tachycardia. ABDOMEN - Soft,  nondistended  Ext: warm, well perfused, intact peripheral pulses, no Peripheral edema.  Neuro - Limited exam due to intubation and AMS.  Pt is intubated..  Not sedated.  Unresponsive.  Not following any commands.  Extensor posturing on B/L UE's, to sternal rub left greater than right. eyes closed, doesn't not open with voice or pain.  Hypotropia in Left eye, right eye mid position, left pupil pinpoint, right pupil 3 mm and fixed, doesn't react to light. Left facial droop. Corneal reflex brisk b/l .  Sensation, coordination and gait not tested.  Plantars mute bilaterallyASSESSMENT/PLAN  Charlene [name] is a 55 y.o. female with history of hypertension with difficulty maintaining blood pressures and noncompliance to medications for various reasons, presented to the emergency room at [facility] for left-sided  weakness and headaches.  She was appear to be confused and lethargic. She was seen by telemedicine evaluation by neurologist for concern for stroke and a noncontrast head CT showed a right thalamic/basal ganglia hemorrhage 3.2 x 2.1 x 2.5  with intraventricular extension.  He was transferred ED to ED for inpatient neurological evaluation and admission. BP high at  178/109 mmHg on Admission. CTA head & neck showed No intracranial large vessel occlusion or proximal high-grade arterial stenosis. 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 Repeat CT Head on 08/27/20 showed- Stable parenchymal hemorrhage in the right thalamus and basal ganglia with intraventricular extension. ECHO LVEF 60-65%. mild concentric left ventricular hypertrophy. HgbA1c 6.0. LDL 167. Repeat CT head (08/27/20 )showed Stable parenchymal hemorrhage in the right thalamus and basal ganglia with intraventricular extension. S/P ventricular catheter placement on 08/27/2020. Following that Pt had labored breathing so intubated. CT head repeat post procedure didn't demonstrate any increase in size of hemorrhage.  3% normal saline was stopped. PT recommends SNF. MRA normal, MRI showed scattered b/l hippocampus, posterior CC, bilateral thalamus, and upper pons.  . Pt is on Vanco and Cefipime due to fever. Cultures pending but negative until now. . Goals of care discussed with family by Attending  and palliative care over the weekend. Palliative care on board.   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 S/P ventricular catheter placement on 08/27/2020.  Without improvement and ventriculostomy DC on 08/30/20  CT Head -  Right thalamic ICH with IVH in the third and fourth ventricles and nearly filling the right lateral ventricle. No hydrocephalus.   CTA head & neck - No intracranial large vessel occlusion or proximal high-grade arterial stenosis. 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   Repeat CT Head x 3 - Stable parenchymal hemorrhage in the right thalamus and basal ganglia with intraventricular extension as described.   2D ECHO LVEF 60-65%. mild  concentric left ventricular hypertrophy  LDL 167  HgbA1c 6.0  Vitamin B12 301  S/P ventricular catheter placement on 08/27/2020. And dc on 08/30/20 due to lack of movement and nonfunctioning  VTE prophylaxis - heparin subq  No antithrombotic prior to admission, now on No antithrombotic due to [place].   Therapy recommendations: SNF  Disposition: TBD  Slight obstructive hydrocephalus  CT Head -  IVH in the third and fourth ventricles and nearly filling the right lateral ventricle.  Ventricle size slightly larger than her baseline CT in 04/2020  Repeat CT head x 3 stable ventricle size.   NSG on board  S/P ventricular catheter placement on 08/27/2020.   Minimal drainage  Intracranial pressure not high  Neurosurgery clamped EVD 08/29/20 and dc on 08/30/20 aster f/u Ct stable  CCM following   Stroke - scattered b/l hippocampus, posterior CC, bilateral thalamus, and upper pons - likely due to deep penetrating vessel compression by focal mass-effect  MRI brain 2/11 right greater than left mesial temporal lobes / parahippocampal cortex, bilateral basal ganglia, and possibly the upper pons that are concerning for acute infarcts, most likely secondary to mass effect from the hemorrhage.  MRA Normal  EEG suggestive of cortical dysfunction in right hemisphere likely secondaru to underlyig bleed as well as severe diffuse encephalopathy, nonspecific etiology. No seizures or epileptiform discharges  Off 3% saline   Na goal 150-155  Na 140->146->151>154  Hypertensive emergency hypotension  Home meds: Coreg 6.25 mg and Hydrochlorothiazide 25 mg daily  Unstable  . BP goal SP <160 mmHG for now . Off Claviprex.  . Continue labetalol and levophed PRN.   Marland Kitchen Long-term BP goal normotensive  Hyperlipidemia  Home meds: None   LDL 167, goal < 70  Hold starting statin due to acute ICH  Consider statin at discharge  AKI  Cre 1.09 -> 1.94>1.09  Continue IV fluid  Foley placed on 08/28/20  BMP monitoring  Avoid Nephrotoxic drugs.  Fever  On Vancomycin and Cefipime.  Monitor Temp  Blood C/S pending - Negative until now.  Resp sec c/s pending   Other Stroke Risk Factors  Noncompliance  Hospital day # 4 Patient neurological exam remains quite poor and chances of survival even with prolonged life support tracheostomy and PEG tube are slim and resultant quality of life is going to be quite poor requiring 24-hour nursing care.  Patient's family is struggling with the decision about goals of care.  Palliative care team has also met with the family and husband is asked for more time to make a decision.  Continue supportive care.  Appreciate help from critical care team and critical care team.  Discussed with Jennelle Human critical care nurse practitioner. This patient is critically ill and at significant risk of neurological worsening, death and care requires constant monitoring of vital signs, hemodynamics,respiratory and cardiac monitoring, extensive review of multiple databases, frequent neurological assessment, discussion with family, other specialists and medical decision making of high complexity.I have made any additions or clarifications directly to the above note.This critical care time does not reflect procedure time, or teaching time or supervisory time of PA/NP/Med Resident etc but could involve care discussion time.  I spent 30 minutes of neurocritical care time  in the care of  this patient.  Antony Contras, MD    To contact Stroke Continuity provider, please refer to [email or web address] After hours, contact General Neurology

## 2020-09-01 ENCOUNTER — Inpatient Hospital Stay (HOSPITAL_COMMUNITY): Payer: Medicaid Other

## 2020-09-01 DIAGNOSIS — I619 Nontraumatic intracerebral hemorrhage, unspecified: Secondary | ICD-10-CM

## 2020-09-01 LAB — BASIC METABOLIC PANEL
Anion gap: 14 (ref 5–15)
BUN: 26 mg/dL — ABNORMAL HIGH (ref 6–20)
CO2: 19 mmol/L — ABNORMAL LOW (ref 22–32)
Calcium: 8.6 mg/dL — ABNORMAL LOW (ref 8.9–10.3)
Chloride: 118 mmol/L — ABNORMAL HIGH (ref 98–111)
Creatinine, Ser: 1.04 mg/dL — ABNORMAL HIGH (ref 0.44–1.00)
GFR, Estimated: >60 mL/min (ref >60)
Glucose, Bld: 178 mg/dL — ABNORMAL HIGH (ref 70–99)
Potassium: 3.8 mmol/L (ref 3.5–5.1)
Sodium: 151 mmol/L — ABNORMAL HIGH (ref 135–145)

## 2020-09-01 LAB — SODIUM
Sodium: 150 mmol/L — ABNORMAL HIGH (ref 135–145)
Sodium: 150 mmol/L — ABNORMAL HIGH (ref 135–145)
Sodium: 150 mmol/L — ABNORMAL HIGH (ref 135–145)

## 2020-09-01 LAB — CBC
HCT: 24.4 % — ABNORMAL LOW (ref 36.0–46.0)
Hemoglobin: 7.8 g/dL — ABNORMAL LOW (ref 12.0–15.0)
MCH: 25.1 pg — ABNORMAL LOW (ref 26.0–34.0)
MCHC: 32 g/dL (ref 30.0–36.0)
MCV: 78.5 fL — ABNORMAL LOW (ref 80.0–100.0)
Platelets: 154 10*3/uL (ref 150–400)
RBC: 3.11 MIL/uL — ABNORMAL LOW (ref 3.87–5.11)
RDW: 16.2 % — ABNORMAL HIGH (ref 11.5–15.5)
WBC: 10.1 10*3/uL (ref 4.0–10.5)
nRBC: 0 % (ref 0.0–0.2)

## 2020-09-01 LAB — PHOSPHORUS: Phosphorus: 4.3 mg/dL (ref 2.5–4.6)

## 2020-09-01 LAB — GLUCOSE, CAPILLARY
Glucose-Capillary: 164 mg/dL — ABNORMAL HIGH (ref 70–99)
Glucose-Capillary: 150 mg/dL — ABNORMAL HIGH (ref 70–99)
Glucose-Capillary: 158 mg/dL — ABNORMAL HIGH (ref 70–99)
Glucose-Capillary: 143 mg/dL — ABNORMAL HIGH (ref 70–99)
Glucose-Capillary: 184 mg/dL — ABNORMAL HIGH (ref 70–99)

## 2020-09-01 IMAGING — DX DG CHEST 1V PORT
1 series · 1 of 1 positions shown · non-contrast
Comparison: [DATE].

CLINICAL DATA: Respiratory failure.

EXAM:
PORTABLE CHEST 1 VIEW

[chest]
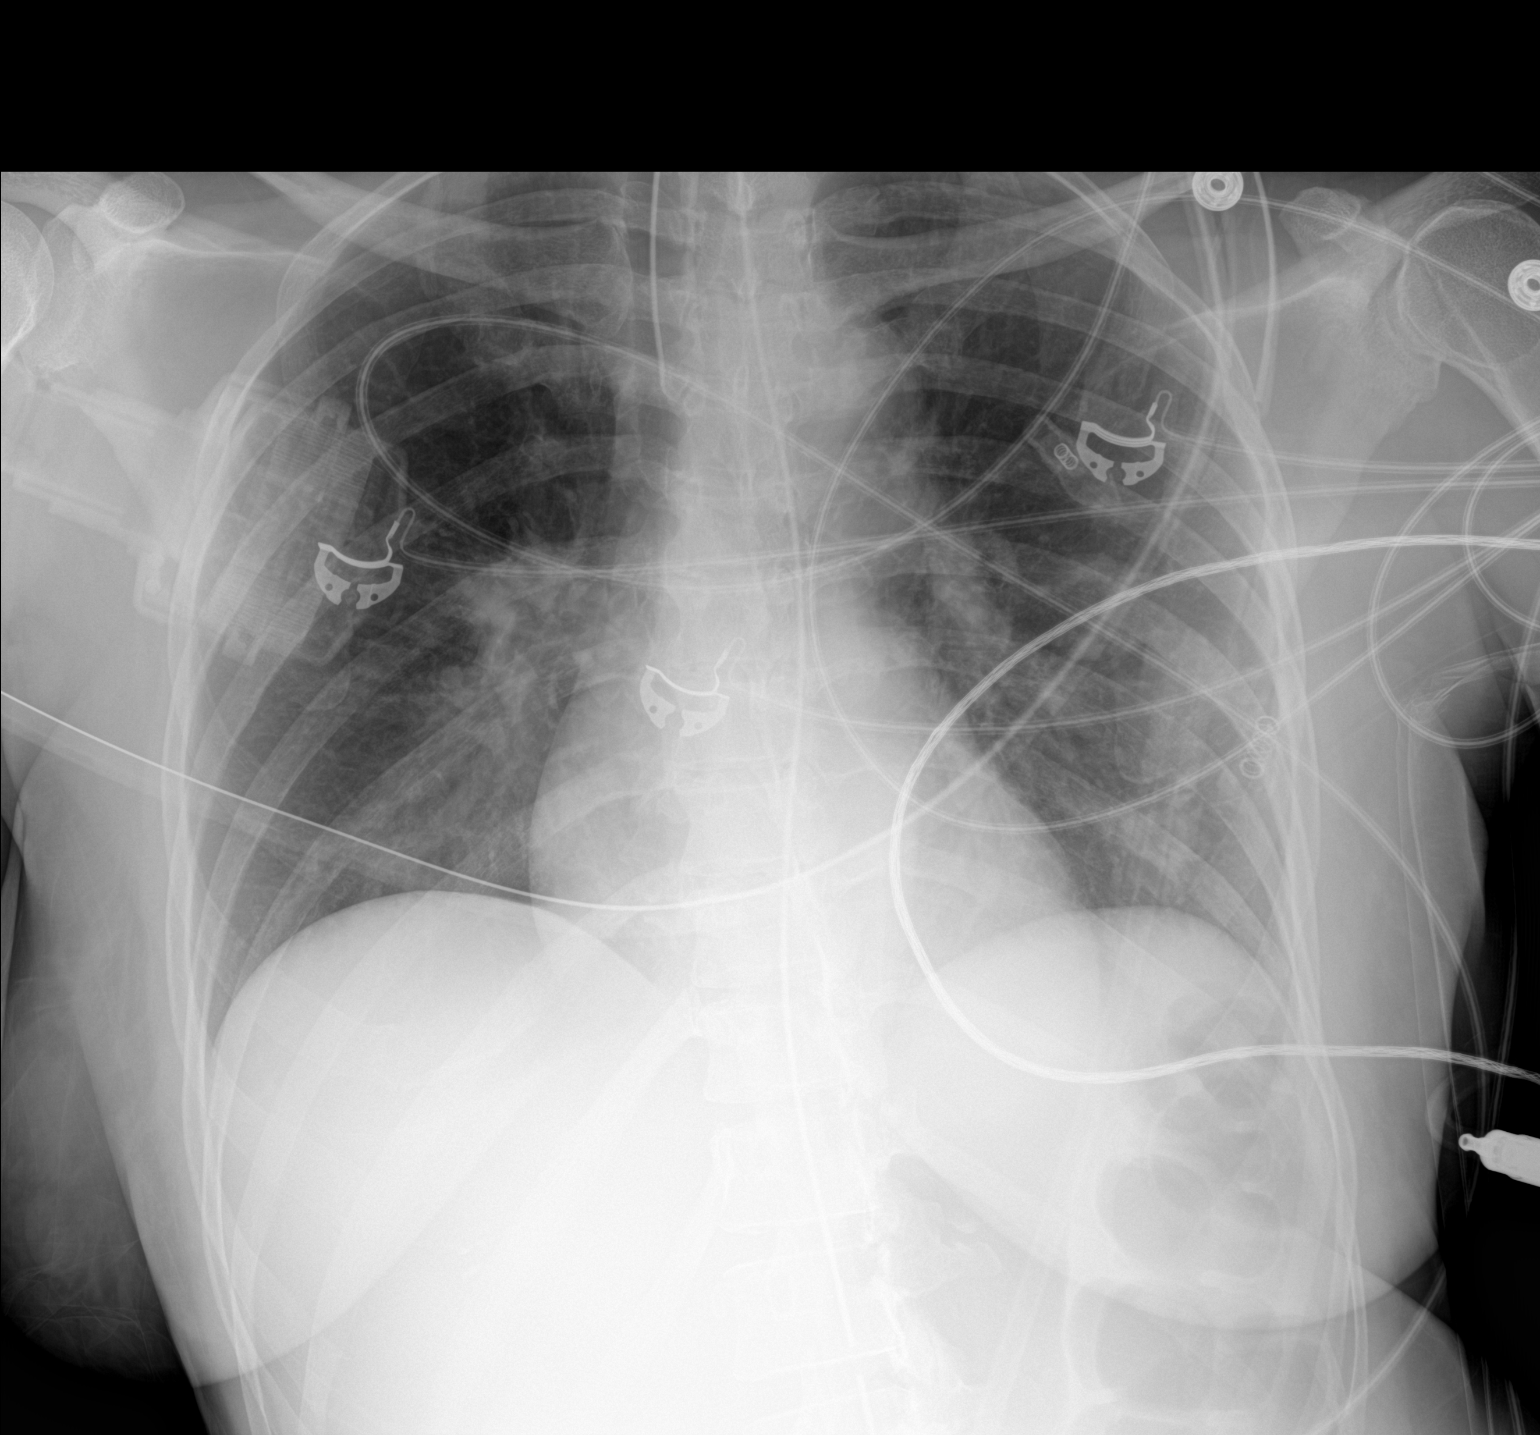

[1 of 1 positions shown; findings below may reference images not displayed]

FINDINGS: Interval placement of ET tube with tip above the carina. The
nasogastric tube tip is below the GE junction. Heart size appears
within normal limits. No pleural effusion or edema. New asymmetric
opacity is a identified within the medial right lung base which may
represent infiltrate or atelectasis.
IMPRESSION: 1. Satisfactory position of ET tube and nasogastric tube.
2. New asymmetric opacity within the medial right lung base,
nonspecific.

## 2020-09-01 MED ORDER — BETHANECHOL CHLORIDE 10 MG PO TABS
5.0000 mg | ORAL_TABLET | Freq: Three times a day (TID) | ORAL | Status: DC
  Administered 2020-09-01 – 2020-09-07 (×18): 5 mg
  Filled 2020-09-01 (×18): qty 1

## 2020-09-01 MED ORDER — ENOXAPARIN SODIUM 40 MG/0.4ML ~~LOC~~ SOLN
40.0000 mg | SUBCUTANEOUS | Status: DC
  Administered 2020-09-01 – 2020-11-20 (×81): 40 mg via SUBCUTANEOUS
  Filled 2020-09-01 (×79): qty 0.4

## 2020-09-01 MED ORDER — DEXMEDETOMIDINE HCL IN NACL 400 MCG/100ML IV SOLN
0.0000 ug/kg/h | INTRAVENOUS | Status: DC
  Administered 2020-09-01 (×2): 0.4 ug/kg/h via INTRAVENOUS
  Administered 2020-09-02: 0.2 ug/kg/h via INTRAVENOUS
  Filled 2020-09-01 (×2): qty 100

## 2020-09-01 NOTE — Progress Notes (Addendum)
STROKE TEAM PROGRESS NOTE   INTERVAL HISTORY No acute events overnight.  Neuro neurological exam with  bilateral extensor posturing. Pt is examined at the bedside. No family present in the room. She remains intubated for respiratory failure with extensor posturing on B/L UE, eyes closed, doesn't not open with voice or pain.  Hypotropia in Left eye, right eye mid position, left pupil 2 mm, right pupil 3 mm and fixed, doesn't react to light. Corneal reflex brisk b/l. Mild Left facial droop.  Trace withdrawing of all 4 limbs to noxious stimuli.  Serum sodium is 151 and she has been off hypertonic saline now for several days.  White count is downtrend at 10.1.  Currently, Afebrile but had temp of 100.1 at 0000 today. Pt is on Vanco and Cefipime. Cultures pending but negative until now. Hb is 7.8 , has been running low. PT/OT recommends SNF.  CCM and Palliative care on board and following. Husband stated that he needs more time to make decision.  Vitals:   09/01/20 0900 09/01/20 1000 09/01/20 1100 09/01/20 1200  BP: (!) 143/80 (!) 148/80 138/71   Pulse: 79 (!) 101 83   Resp: 20 (!) 24 (!) 21   Temp:    99.5 F (37.5 C)  TempSrc:    Axillary  SpO2: 100% 100% 100%   Weight:      Height:       CBC:  Recent Labs  Lab 08/26/20 2205 08/27/20 2020 08/31/20 0411 09/01/20 0347  WBC 8.3   < > 13.0* 10.1  NEUTROABS 3.5  --   --   --   HGB 11.7*   < > 8.5* 7.8*  HCT 37.3   < > 28.1* 24.4*  MCV 76.6*   < > 79.4* 78.5*  PLT 304   < > 171 154   < > = values in this interval not displayed.   Basic Metabolic Panel:  Recent Labs  Lab 08/29/20 1617 08/29/20 2114 08/31/20 0411 08/31/20 0941 09/01/20 0347 09/01/20 1014  NA 155*   < > 154*   < > 151* 150*  K  --    < > 3.6  --  3.8  --   CL  --    < > 120*  --  118*  --   CO2  --    < > 25  --  19*  --   GLUCOSE  --    < > 130*  --  178*  --   BUN  --    < > 24*  --  26*  --   CREATININE  --    < > 1.13*  --  1.04*  --   CALCIUM  --    < > 9.1   --  8.6*  --   MG 1.9  --  2.3  --   --   --   PHOS 3.2  --  2.4*  --  4.3  --    < > = values in this interval not displayed.   Lipid Panel:  Recent Labs  Lab 08/27/20 0251 08/28/20 0326 08/31/20 0411  CHOL 265*  --   --   TRIG 129   < > 114  HDL 72  --   --   CHOLHDL 3.7  --   --   VLDL 26  --   --   LDLCALC 694*  --   --    < > = values in this interval not displayed.  HgbA1c:  Recent Labs  Lab 08/27/20 0251  HGBA1C 6.0*   Urine Drug Screen:  Recent Labs  Lab 08/27/20 1854  LABOPIA POSITIVE*  COCAINSCRNUR NONE DETECTED  LABBENZ NONE DETECTED  AMPHETMU NONE DETECTED  THCU NONE DETECTED  LABBARB NONE DETECTED    Alcohol Level  Recent Labs  Lab 08/26/20 2225  ETH <10    IMAGING past 24 hours DG CHEST PORT 1 VIEW  Result Date: 09/01/2020 CLINICAL DATA:  Respiratory failure. EXAM: PORTABLE CHEST 1 VIEW COMPARISON:  08/30/2020. FINDINGS: Interval placement of ET tube with tip above the carina. The nasogastric tube tip is below the GE junction. Heart size appears within normal limits. No pleural effusion or edema. New asymmetric opacity is a identified within the medial right lung base which may represent infiltrate or atelectasis. IMPRESSION: 1. Satisfactory position of ET tube and nasogastric tube. 2. New asymmetric opacity within the medial right lung base, nonspecific. Electronically Signed   By: Signa Kellaylor  Stroud M.D.   On: 09/01/2020 10:48    PHYSICAL EXAM GENERAL: Well nourished, well developed middle-aged African-American lady. Critically ill, Intubated but not sedated.  HEENT: - Normocephalic and atraumatic.  LUNGS - On Ventilator CV - no JVD, No Peripheral Edema, Tachycardia. ABDOMEN - Soft,  nondistended  Ext: warm, well perfused, intact peripheral pulses, no Peripheral edema. Skin- Yellow.  Neuro - Limited exam due to intubation and AMS.  Pt is intubated..  Not sedated.  Unresponsive.  Not following any commands.  Extensor posturing on B/L UE's, to  sternal rub left greater than right. eyes closed, doesn't not open with voice or pain. Trace withdrawing of all 4 limbs to noxious stimuli. Hypotropia in Left eye, right eye mid position, left pupil 2 mm, right pupil 3 mm, doesn't react to light. Corneal reflex brisk b/l . Left facial droop.  Sensation, coordination and gait not tested.  Plantars mute bilaterally ASSESSMENT/PLAN  [name] is a 55 y.o. female with history of hypertension with difficulty maintaining blood pressures and noncompliance to medications for various reasons, [facility] left-sided weakness and headaches.  She was appear to be confused and lethargic. She was seen by telemedicine evaluation by neurologist for concern for stroke and a noncontrast head CT showed a right thalamic/basal ganglia hemorrhage 3.2 x 2.1 x 2.5  with intraventricular extension.  He was transferred ED to ED for inpatient neurological evaluation and admission. BP high at  178/109 mmHg on Admission. CTA head & neck showed No intracranial large vessel occlusion or proximal high-grade arterial stenosis. 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 Repeat CT Head on 08/27/20 showed- Stable parenchymal hemorrhage in the right thalamus and basal ganglia with intraventricular extension. ECHO LVEF 60-65%. mild concentric left ventricular hypertrophy. HgbA1c 6.0. LDL 167. Repeat CT head (08/27/20 )showed Stable parenchymal hemorrhage in the right thalamus and basal ganglia with intraventricular extension. S/P ventricular catheter placement on 08/27/2020. Following that Pt had labored breathing so intubated. CT head repeat post procedure didn't demonstrate any increase in size of hemorrhage.  3% normal saline was stopped. PT recommends SNF. MRA normal, MRI showed scattered b/l hippocampus, posterior CC, bilateral thalamus, and upper pons.  . Pt is on Vanco and Cefipime due to  fever. Cultures pending but negative until now. . Goals of care discussed with family by Attending  and palliative care over the weekend. Palliative care and CCM on board.   ICH - right  thalamus ICH with IVH - secondary to HTN and non compliance with medication. S/P ventricular catheter placement on 08/27/2020.  Without improvement and ventriculostomy DC on 08/30/20  CT Head -  Right thalamic ICH with IVH in the third and fourth ventricles and nearly filling the right lateral ventricle. No hydrocephalus.   CTA head & neck - No intracranial large vessel occlusion or proximal high-grade arterial stenosis. 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   Repeat CT Head x 3 - Stable parenchymal hemorrhage in the right thalamus and basal ganglia with intraventricular extension as described.   2D ECHO LVEF 60-65%. mild concentric left ventricular hypertrophy  LDL 167  HgbA1c 6.0  Vitamin B12 301  S/P ventricular catheter placement on 08/27/2020. And dc on 08/30/20 due to lack of movement and nonfunctioning  VTE prophylaxis - SCD's - Pharmacy consult to start LMW Heparin.  No antithrombotic prior to admission, now on No antithrombotic due to ICH.   Therapy recommendations: SNF  Disposition: TBD  Slight obstructive hydrocephalus  CT Head -  IVH in the third and fourth ventricles and nearly filling the right lateral ventricle.  Ventricle size slightly larger than her baseline CT in 04/2020  Repeat CT head x 3 stable ventricle size.   NSG on board  S/P ventricular catheter placement on 08/27/2020.   Minimal drainage  Intracranial pressure not high  Neurosurgery clamped EVD 08/29/20 and dc on 08/30/20 aster f/u Ct stable  CCM following   Stroke - scattered b/l hippocampus, posterior CC, bilateral thalamus, and upper pons - likely due to deep penetrating vessel compression by focal mass-effect  MRI brain 2/11 right greater than left mesial  temporal lobes / parahippocampal cortex, bilateral basal ganglia, and possibly the upper pons that are concerning for acute infarcts, most likely secondary to mass effect from the hemorrhage.  MRA Normal  EEG suggestive of cortical dysfunction in right hemisphere likely secondaru to underlyig bleed as well as severe diffuse encephalopathy, nonspecific etiology. No seizures or epileptiform discharges  Off 3% saline   Na goal 150-155  Na 140->146->151>154>151  Hypertensive emergency hypotension  Home meds: Coreg 6.25 mg and Hydrochlorothiazide 25 mg daily  Unstable  . BP goal SP <160 mmHG for now . Off Claviprex.  . Continue labetalol and levophed PRN.  Marland Kitchen Long-term BP goal normotensive  Hyperlipidemia  Home meds: None   LDL 167, goal < 70  Hold starting statin due to acute ICH  Consider statin at discharge  AKI  Cre 1.09 -> 1.94>1.09>1.04  Continue IV fluid  Foley placed on 08/28/20  BMP monitoring  Avoid Nephrotoxic drugs.  Anemia   Hb 7.8   CCM aware.  Monitor and transfuse per protocol.  Fever  On Vancomycin and Cefipime.  Monitor Temp  WBC down trending (13.0 >10.1)  Blood C/S pending - Negative until now.  Resp sec c/s pending   Other Stroke Risk Factors  Noncompliance  Hospital day # 5 I have personally obtained history,examined this patient, reviewed notes, independently viewed imaging studies, participated in medical decision making and plan of care.ROS completed by me personally and pertinent positives fully documented  I have made any additions or clarifications directly to the above note. Agree with note above.  Patient condition remains poor with practically no neurological improvement with family struggling with decision about goals of care and will need to do trach and PEG and needs more time.  Continue ventilatory support treatment for respiratory failure and sepsis as  per CCM team.  Discussed with Dr. [name] CCM attending and  answered questions. This patient is critically ill and at significant risk of neurological worsening, death and care requires constant monitoring of vital signs, hemodynamics,respiratory and cardiac monitoring, extensive review of multiple databases, frequent neurological assessment, discussion with family, other specialists and medical decision making of high complexity.I have made any additions or clarifications directly to the above note.This critical care time does not reflect procedure time, or teaching time or supervisory time of PA/NP/Med Resident etc but could involve care discussion time.  I spent 30 minutes of neurocritical care time  in the care of  this patient.  I spoke to the patient's daughter [name] [phone] and given an update about the patient's conditions and prognosis.  She would like to set up family meeting to discuss goals of care and I will ask palliative care nurse practitioner to set it up  Delia Heady, MD Medical Director [facility] Pager: [phone] 09/01/2020 2:32 PM    To contact Stroke Continuity provider, please refer to [email or web address]. After hours, contact General Neurology

## 2020-09-01 NOTE — Progress Notes (Signed)
Patients husband, Charlene [name], requests to give up his visitation rights in order for a blood relative to be the designated visitor. He wants to remain involved in the medical decision making for his wife.  Husband has been receiving threats from other family members regarding the decisions he has been making and blaming him for the state she is in. Other family members suggest he is lying about hospital policies and that he is withholding medical information. I have personally witnessed him being very forth-coming with information to remaining family members. Due to the circumstantial stress and additional stress family member are pressuring him with, he has come to this decision. I inquired which family member he would suggest to become the primary visitor, he stated the patients father, Charlene [name] [phone]). Charlene [name] states he is very level headed and thinks he will be a good advocate for Charlene [name], and will help settle the remaining family members. With the agreeance of our DD, Charlene [name], the patient will continue to have only one visitor allowed during her LOS. The daughter, Charlene [name], states she has POA paperwork, but that has yet to be verified by staff. Going forward, she will not be allowed as she has caused multiple scenes and hinders care. Charlene [name]), and Charlene [name] from Palliative updated regarding this situation and advised to follow up with Charlene [name] via phone.

## 2020-09-01 NOTE — Progress Notes (Signed)
Patient ID: Charlene [name], female   DOB: 1966/05/21, 55 y.o.   MRN: [id number]  Medical records reviewed   55 year old female with past medical history as below, which is significant for hypertension and reported medication noncompliance.  Presented to the emergency department at [facility] 2/9 with c/o headache and left-sided weakness.  STAT CT Head demonstrated right thalamic/basal ganglia hemorrhage with intraventricular extension.  She was transferred to [facility] emergency department for in person neurology evaluation. There was evidence of obstructive hydrocephalus and neurosurgery was consulted. External ventricular drain was placed 2/10. In the hours following she became increasingly obtunded. PCCM was consulted. Upon PCCM arrival the patient became apneic and was intubated. EVD removed 2/13  Today is day 5 of this hospitalization.  Patient remains intubated/respiratory failure, with extensor posturing, unresponsive to gentle touch and verbal stimuli.  Likely long term poor prognosis.  This NP assessed patient and discussed with bedside RN and Dr Pearlean Brownie.  I spoke with husband by telephone, husband speaks to complex family dynamics. Patient's husband gives me permission to speak with [name]/patient's oldest daughter  Daughter Lisette Abu returned my phone call from yesterday and we had a conversation regarding her mother's current medical situation.  I requested any documentation reflecting healthcare power of attorney or advanced care planning documents to be brought to the hospital.  I had a conversation regarding her mother's current medical situation; diagnosis, prognosis, treatment option decisions and options.   She tells me she has had updates but just keeps hearing "she is stable"  Questions and concerns addressed to the best of my ability.  Discussed possibility of family meeting and she is open to.  She will coordinate with several family members to include her adult siblings, parents and  patient's sister for a meeting on Thursday 09-03-20 at 11:00   Husband is in agreement.  I will coordinate.   Today husband/am has made decision to allow patient's father/Johnny Dibbern be the one allowed visitor.  He feels this is the best thing for both the patient and the family as a whole.  "my wife was always a daddy's girl" Husband remains legal decision maker at this time.  Emotional support and therapeutic listening.  Discussed with husband  the importance of continued conversation with the family and the  medical providers regarding overall plan of care and treatment options,  ensuring decisions are within the context of the patients values and GOCs.  Questions and concerns addressed   Discussed with bedside RN and Dr Pearlean Brownie and Dr Mikal Plane via secure chat  Total time spent on the unit was 35 minutes   PMT will continue to support holisticaly  Greater than 50% of the time was spent in counseling and coordination of care  Lorinda Creed NP  Palliative Medicine Team Team Phone # [phone] Pager [phone]

## 2020-09-01 NOTE — Progress Notes (Signed)
NAME:  Charlene [name], MRN:  [id number], DOB:  12/17/1965, LOS: 5 ADMISSION DATE:  08/26/2020, CONSULTATION DATE:  2/10 REFERRING MD:  Dr. 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  On 2/10 she had acute mental status change to obtundation and respiratory arrest resulting in intubation.  History of Present Illness:  55 year old female with past medical history as below, which is significant for hypertension and medication noncompliance.  Presented to the emergency department at [facility] 2/9 with c/o headache and left-sided weakness.  STAT CT Head demonstrated right thalamic/basal ganglia hemorrhage with intraventricular extension.  She was transferred to [facility] emergency department for in person neurology evaluation. There was evidence of obstructive hydrocephalus and neurosurgery was consulted. External ventricular drain was placed 2/10. In the hours following she became increasingly obtunded. PCCM was consulted. Upon PCCM arrival the patient became apneic and was intubated. EVD removed 2/13  Past Medical History:  HTN, medication noncompliance  Significant Hospital Events:  2/09 Admit for ICH 2/10 EVD placed, later intubated due to obtundation and respiratory arrest 2/11 MRI Brain, hypertonic saline d/c'd, EVD not working.  Husband did not consent to PICC   Consults:  Stroke PCCM Neurosurgery  Procedures:  ETT 2/10 >> EVD 2/10 >> non functioning 2/11 at 1500; clamped 2/12 am >>  Significant Diagnostic Tests:   CT Head 2/9 >> Acute hemorrhage with the epicenter in the right thalamus. Measurement is approximately 3.2 x 2.1 x 2.5 cm (volume 8.8 cm^3). Intraventricular penetration with blood filling the third and fourth ventricles and nearly filling the right lateral ventricle. No hydrocephalus.  CTA Head/Neck 2/10 >> Unchanged size of intraparenchymal hemorrhage centered in the right thalamus and basal ganglia with  extension into the right lateral ventricle. 2. No intracranial arterial occlusion or high-grade stenosis. No dissection, aneurysm or hemodynamically significant stenosis of the carotid or vertebral arteries.  CT Head 2/10 >> Stable parenchymal hemorrhage in the right thalamus and basal ganglia with intraventricular extension as described. No new focal abnormality is noted.  CT Head 2/10 (post intubation) >> redemonstrated intraparenchymal hemorrhage centered within the R thalamus/basal ganglia extending into the cerebral peduncle/midbrain with associated intraventricular extension and hemorrhage in the R lateral ventricle and fourth ventricle, overall not significantly changed from prior; stable mass effect, 70mm of R to L midline shift, postsurgical changes of ventriculostomy TTE 2/10 >> 1. Left ventricular ejection fraction, by estimation, is 60 to 65%. The  left ventricle has normal function. The left ventricle has no regional  wall motion abnormalities. There is mild concentric left ventricular hypertrophy. Left ventricular diastolic parameters were normal.  2. Right ventricular systolic function is normal. The right ventricular size is normal. There is normal pulmonary artery systolic pressure.  3. The mitral valve is normal in structure. Trivial mitral valve regurgitation. No evidence of mitral stenosis.  4. The aortic valve is tricuspid. Aortic valve regurgitation is not visualized. No aortic stenosis is present.  5. The inferior vena cava is normal in size with <50% respiratory variability, suggesting right atrial pressure of 8 mmHg.   MRA head 2/11 >>Negative intracranial MR angiography. No evidence of aneurysm or high flow vascular malformation to explain the intraparenchymal hemorrhage   MR brain 2/11 >> 1. When comparing across modalities, similar intraparenchymal hemorrhage centered within the right thalamus and basal ganglia extending into the right midbrain with intraventricular  extension. There is effacement of the prepontine cistern and similar 4 mm of leftward midline  shift. 2. While acute blood products limits evaluation, there are separate areas of restricted diffusion and edema involving the right greater than left mesial temporal lobes/parahippocampal cortex, bilateral basal ganglia, and possibly the upper pons that are concerning for acute infarcts, most likely secondary to mass effect from the hemorrhage. 3. While concerning for infarcts, some of these areas (particularly the right parahippocampal and the splenium of the corpus callosum) could also be in part secondary to drugs, seizures, or metabolic derangement. 4. Similar size of the ventricular system with right frontal ventriculostomy catheter in place. Similar rounding of the temporal horns, which warrants attention on follow-up.  Micro Data:  2/9 COVID + Influenza negative 2/10 MRSA PCR negative 2/12 trach asp >> 2/12 BCx 2 >>  Antimicrobials:  2/12 cefepime >> 2/12 vanc >>  Interim History / Subjective:  CTH-13th-worse edema tmax 101.9/ WBC 10.1 Remains off sedation, unresponsive  Objective   Blood pressure 138/83, pulse 75, temperature 98.3 F (36.8 C), temperature source Axillary, resp. rate 19, height 5\' 5"  (1.651 m), weight 63.5 kg, last menstrual period 03/03/2014, SpO2 100 %, unknown if currently breastfeeding.    Vent Mode: PRVC FiO2 (%):  [30 %] 30 % Set Rate:  [14 bmp] 14 bmp Vt Set:  [450 mL] 450 mL PEEP:  [5 cmH20] 5 cmH20 Plateau Pressure:  [10 cmH20-15 cmH20] 14 cmH20   Intake/Output Summary (Last 24 hours) at 09/01/2020 0925 Last data filed at 09/01/2020 0800 Gross per 24 hour  Intake 1760.36 ml  Output 1000 ml  Net 760.36 ml   Filed Weights   08/26/20 2237 08/29/20 0500 08/30/20 0500  Weight: 61.2 kg 64.1 kg 63.5 kg    Examination: General: Critically ill, on vent, minimal support.  HEENT: Moist oral mucosa, endotracheal tube in place Neuro: Bilateral  upper extremity extensor posturing CV: S1-S2 appreciated PULM: Clear breath sounds GI: Bowel sounds appreciated Extremities: Warm and dry Skin: No rashes  Lab data reviewed  CXR 2/13 reviewed-ET tube stable, no acute infiltrate -Will repeat today  Resolved Hospital Problem list     Assessment & Plan:  Intraparenchymal hemorrhage with right thalamic hemorrhage with extension into the third fourth and right lateral ventricle Obstructive hydrocephalus Hypertensive emergency -EVD in place from 2/10-2/13 -Serial neuro exams -Prognosis is guarded, with worsening CT scan findings prognosis for meaningful recovery is poor -Did discuss with Dr. 09-05-1971 neurology, prognosis is poor for meaningful recovery  Acute hypoxemic respiratory failure secondary to CVA -Continue full mechanical ventilator support -VAP prevention -She is off sedation and unresponsive -Keep head of the bed elevated greater than 30 degrees -She will require tracheostomy when prognosis for above condition is considered  Acute kidney injury-resolving Electrolyte derangements Hypernatremia-was on hypertonic saline -Continue to trend electrolytes -Avoid nephrotoxic agents -Maintain renal perfusion  Continue tube feeds  Central fever -Off antibiotics at present -Cultures negative   Anemia -Will monitor Transfuse per protocol  Appreciate palliative care involvement with care  Best practice (evaluated daily)  Diet: Tube feeds Pain/Anxiety/Delirium protocol (if indicated): prn fentanyl/ adding low dose precedex VAP protocol (if indicated): In place DVT prophylaxis: SCDs GI prophylaxis: PPI Glucose control: CBG q 4, SSI sensitive Mobility: Bedrest Disposition: ICU  Goals of Care:  Last date of multidisciplinary goals of care discussion: Per primary Family and staff present:  Summary of discussion:  Follow up goals of care discussion due: per primary, palliative care involved.  Patient has 4 adult  children from prior marriage and two teenage children in current marriage.  Possibly  some discord between the two. Recommend PMT consult as patient's husband refused PICC placement on 2/11 and since has only requested to speak to Dr. [name] on Monday regarding GOC.  Code Status: FULL   The patient is critically ill with multiple organ systems failure and requires high complexity decision making for assessment and support, frequent evaluation and titration of therapies, application of advanced monitoring technologies and extensive interpretation of multiple databases. Critical Care Time devoted to patient care services described in this note independent of APP/resident time (if applicable)  is 30 minutes.   Virl Diamond MD [facility] Personal pager: [phone] If unanswered, please page CCM On-call: #[phone]

## 2020-09-01 NOTE — Progress Notes (Addendum)
This chaplain responded to PMT consult for spiritual care.  The chaplain missed the Pt. husband-[name] as he left for visiting hours. The chaplain was updated by the RN-Brittney. This chaplain will F/U with [name] by phone.  **[id number]  This chaplain phoned the Pt. husband-[name]. The chaplain understands [name] has transferred his role as the Pt. healthcare decision maker to the Pt. father-Johnny Roseanne Reno.  [name] is confident Johnny's relationship with the Pt.  is the best decision for the Pt. and family. [name] shared with the chaplain, he understands Bethann Berkshire will be the Pt. one visitor.    The chaplain understands [name] prefers to speak of the Pt. health in the present with "time" for healing.   [name] clarifies, when or if the Pt. health changes, "I want the family hearing the change together."  The chaplain will F/U with spiritual care as needed for the Pt. and family.

## 2020-09-02 LAB — GLUCOSE, CAPILLARY
Glucose-Capillary: 126 mg/dL — ABNORMAL HIGH (ref 70–99)
Glucose-Capillary: 126 mg/dL — ABNORMAL HIGH (ref 70–99)
Glucose-Capillary: 135 mg/dL — ABNORMAL HIGH (ref 70–99)
Glucose-Capillary: 155 mg/dL — ABNORMAL HIGH (ref 70–99)
Glucose-Capillary: 155 mg/dL — ABNORMAL HIGH (ref 70–99)
Glucose-Capillary: 170 mg/dL — ABNORMAL HIGH (ref 70–99)
Glucose-Capillary: 172 mg/dL — ABNORMAL HIGH (ref 70–99)

## 2020-09-02 LAB — SODIUM
Sodium: 151 mmol/L — ABNORMAL HIGH (ref 135–145)
Sodium: 149 mmol/L — ABNORMAL HIGH (ref 135–145)
Sodium: 148 mmol/L — ABNORMAL HIGH (ref 135–145)
Sodium: 144 mmol/L (ref 135–145)

## 2020-09-02 MED ORDER — CARVEDILOL 3.125 MG PO TABS
6.2500 mg | ORAL_TABLET | Freq: Two times a day (BID) | ORAL | Status: DC
  Administered 2020-09-02 – 2020-09-05 (×7): 6.25 mg
  Filled 2020-09-02 (×8): qty 2

## 2020-09-02 MED ORDER — HYDROCHLOROTHIAZIDE 25 MG PO TABS
25.0000 mg | ORAL_TABLET | Freq: Every day | ORAL | Status: DC
  Administered 2020-09-02 – 2020-09-09 (×8): 25 mg
  Filled 2020-09-02 (×8): qty 1

## 2020-09-02 NOTE — Progress Notes (Signed)
NAME:  Charlene [name], MRN:  [id number], DOB:  03/01/1966, LOS: 6 ADMISSION DATE:  08/26/2020, CONSULTATION DATE:  2/10 REFERRING MD:  Dr. Roda Shutters, CHIEF COMPLAINT:  ICH   Brief History:  55 year old female admitted with hypertensive R thalamic/basal ganglia ICH. Resultant obstructive hydrocephalus prompting EVD placement 2/10. Subsequently, 2/10 she had acute mental status change to obtundation and respiratory arrest resulting in intubation.  Past Medical History:  HTN, medication noncompliance  Significant Hospital Events:  2/09 Admit for ICH 2/10 EVD placed, later intubated due to obtundation and respiratory arrest 2/11 MRI Brain, hypertonic saline d/c'd, EVD not working.  Husband did not consent to PICC  2/13 EVD removed  Consults:  Stroke PCCM Neurosurgery  Procedures:  ETT 2/10 >> EVD 2/10 >> non functioning 2/11 at 1500; clamped 2/12 am >> 2/13  Significant Diagnostic Tests:   CT Head 2/9 >> Acute hemorrhage with the epicenter in the right thalamus. Measurement is approximately 3.2 x 2.1 x 2.5 cm (volume 8.8 cm^3). Intraventricular penetration with blood filling the third and fourth ventricles and nearly filling the right lateral ventricle. No hydrocephalus.  CTA Head/Neck 2/10 >> Unchanged size of intraparenchymal hemorrhage centered in the right thalamus and basal ganglia with extension into the right lateral ventricle. 2. No intracranial arterial occlusion or high-grade stenosis. No dissection, aneurysm or hemodynamically significant stenosis of the carotid or vertebral arteries.  CT Head 2/10 >> Stable parenchymal hemorrhage in the right thalamus and basal ganglia with intraventricular extension as described. No new focal abnormality is noted.  CT Head 2/10 (post intubation) >> redemonstrated intraparenchymal hemorrhage centered within the R thalamus/basal ganglia extending into the cerebral peduncle/midbrain with associated intraventricular extension and hemorrhage in the R  lateral ventricle and fourth ventricle, overall not significantly changed from prior; stable mass effect, 39mm of R to L midline shift, postsurgical changes of ventriculostomy  TTE 2/10 >> LVEF 60 to 65%, LV has normal function, no regional wall motion abnormalities, mild concentric LVH.  RV systolic function is normal, normal pulmonary artery systolic pressure.   MRA head 2/11 >>Negative intracranial MR angiography. No evidence of aneurysm or high flow vascular malformation to explain the intraparenchymal hemorrhage   MRI brain 2/11 >> Similar intraparenchymal hemorrhage centered within the right thalamus and basal ganglia extending into the right midbrain with intraventricular extension, effacement of the prepontine cistern and similar 4 mm of leftward midline shift; separate areas of restricted diffusion and edema involving the R > L mesial temporal lobes/parahippocampal cortex, bilateral basal ganglia, and possibly the upper pons that are c/f acute infarcts, most likely secondary to mass effect from hemorrhage.  Micro Data:  2/9 COVID + Influenza negative 2/10 MRSA PCR negative 2/12 Tracheal aspirate >> 2/12 BCx 2 >>  Antimicrobials:  Cefepime 2/12 >> 2/15 Vanc 2/12 >> 2/15  Interim History / Subjective:  No significant events overnight Remains off sedation, unresponsive Extensor posturing  Objective   Blood pressure (!) 175/98, pulse 87, temperature 99 F (37.2 C), temperature source Axillary, resp. rate 16, height 5\' 5"  (1.651 m), weight 63.5 kg, last menstrual period 03/03/2014, SpO2 100 %, unknown if currently breastfeeding.    Vent Mode: PRVC FiO2 (%):  [30 %] 30 % Set Rate:  [14 bmp] 14 bmp Vt Set:  [450 mL] 450 mL PEEP:  [5 cmH20] 5 cmH20 Plateau Pressure:  [14 cmH20-16 cmH20] 15 cmH20   Intake/Output Summary (Last 24 hours) at 09/02/2020 0751 Last data filed at 09/02/2020 0700 Gross per 24 hour  Intake 1319.31  ml  Output 1150 ml  Net 169.31 ml   Filed Weights    08/26/20 2237 08/29/20 0500 08/30/20 0500  Weight: 61.2 kg 64.1 kg 63.5 kg   Examination: General: Critically ill adult female, intubated/unresponsive, NAD. HEENT: Anicteric sclera, R pupil remains fixed. L pupil does react to light. Mild L facial asymmetry. Moist mucous membranes. ETT in place. Neuro: Unresponsive to verbal, tactile stimuli, minimally withdrawing to noxious stimuli off sedation. R pupil as above with L facial droop. +Corneal reflexes bilaterally. Extensor posturing of bilateral UE noted. CV: S1S2, tachycardic, regular rhythm, no m/g/r. PULM: Breathing even and unlabored on vent (PEEP 5, FiO2 30%). Breath sounds coarse, c/w mechanical ventilation. GI: Soft, mildly distended, normoactive bowel sounds. Extremities: BUE extensor posturing as above, no LE edema noted. Skin: Warm/dry, no rashes.  Resolved Hospital Problem list     Assessment & Plan:  Intraparenchymal hemorrhage with right thalamic hemorrhage with extension into the third fourth and right lateral ventricle Obstructive hydrocephalus Hypertensive emergency Presented to [facility] ED 2/9 for HA/L-sided weakness with CT revealing R thalamic/basal ganglia hemorrhage with intraventricular extension; transferred to [facility] for Neuro evaluation. Neurosurgery consulted and EVD placed 2/10 (removed 2/13 for nonfunction). Obtunded 2/10 requiring intubation with progressively worsening neuro exam. - Neurology primary - Continue serial neuro exams - Ongoing guarded prognosis for meaningful recovery - PMT following with plan for family meeting 2/17 1100  Acute hypoxemic respiratory failure secondary to CVA - Continue full vent support - VAP bundle - HOB > 30 degrees - Plan for tracheostomy when prognosis for above condition is discussed  Acute kidney injury, resolving Electrolyte derangements Hypernatremia AKI continues to improve. Previously on hypertonic saline for cerebral edema with intentional iatrogenic Na increase to  150s.  - Trend BMP/lytes - Supplement electrolytes as clinically indicated - Avoid nephrotoxic agents as able  Central fever Previously with fever spiking to 101F. On Vanc/Cefepime 2/12-2/15. Cultures remained negative. - Continue to monitor fever curve  Anemia - Continue to monitor - Transfuse per protocol  Best practice (evaluated daily)  Diet: TFs Pain/Anxiety/Delirium protocol (if indicated): Fentanyl PRN, off other sedation VAP protocol (if indicated): In place DVT prophylaxis: SCDs GI prophylaxis: PPI Glucose control: SSI Mobility: Bedrest Disposition: ICU  Goals of Care:  Last date of multidisciplinary goals of care discussion: Per primary Family and staff present:  Summary of discussion:  Follow up goals of care discussion due: Per primary, Palliative care involved.  Patient has 4 adult children from prior marriage and two teenage children in current marriage.  Possibly some discord between the two. PMT consulted, following. Per PMT note, plan for family meeting/GOC discussion 2/17 at 1100.  Code Status: Full  Critical care time: 32 minutes  Tim Lair, [place] [facility] 09/02/20 7:51 AM  Please see [email or web address] for pager details.

## 2020-09-02 NOTE — Progress Notes (Signed)
Patient ID: Charlene [name], female   DOB: May 19, 1966, 55 y.o.   MRN: [id number]  Medical records reviewed   55 year old female with past medical history as below, which is significant for hypertension and reported medication noncompliance.  Presented to the emergency department at [facility] 2/9 with c/o headache and left-sided weakness.  STAT CT Head demonstrated right thalamic/basal ganglia hemorrhage with intraventricular extension.  She was transferred to [facility] emergency department for in person neurology evaluation. There was evidence of obstructive hydrocephalus and neurosurgery was consulted. External ventricular drain was placed 2/10. In the hours following she became increasingly obtunded. PCCM was consulted. Upon PCCM arrival the patient became apneic and was intubated. EVD removed 2/13  Today is day 6 of this hospitalization.  Patient remains intubated/respiratory failure, with extensor posturing, unresponsive to gentle touch and verbal stimuli.  Likely long term poor prognosis.  This NP assessed patient and discussed with bedside RN.  I met patient's father at bedside.  "this is the hardest thing ever for me"  Charlene [name] is now designated hospital visitor.   Husband remains legal decision maker at this time,  patient's father supports this and trust patient's husband in this role.  Education  regarding current medical situation offered; diagnosis, prognosis, treatment option decisions and options discussed.   Questions and concerns addressed.  Family as a whole are planning for tomorrow's meeting.   Several family members to include her husband,  adult siblings, parents and patient's sister for a meeting on Thursday 09-03-20 at 11:00 is planned.  Emotional support and therapeutic listening.  Questions and concerns addressed to the best of my availablity  Discussed with bedside RN and Dr Leonie Man and Dr Cyndy Freeze via secure chat  Total time spent on the unit was 35 minutes   PMT will  continue to support holistically  Dr Leonie Man responds that he can be present at tomorrow's meeting.  Greater than 50% of the time was spent in counseling and coordination of care  Wadie Lessen NP  Palliative Medicine Team Team Phone # [phone] Pager [phone]

## 2020-09-02 NOTE — Progress Notes (Addendum)
STROKE TEAM PROGRESS NOTE   INTERVAL HISTORY No acute events overnight.  Neuro neurological exam with  bilateral extensor posturing (Lt >Rt). Pt is examined at the bedside. No family present in the room. She remains intubated for respiratory failure, eyes closed, doesn't not open with voice or pain.  Hypotropia in Left eye, right eye mid position, left pupil 2 mm, right pupil 3 mm and fixed, doesn't react to light. Trace corneal reflex b/l. Mild Left facial droop.  Trace withdrawing of all 4 limbs to noxious stimuli.  BP has been running high from systolic between 323- 199 and diastolic between 55-732 mm HG. Serum sodium is 151 and she has been off hypertonic saline now for several days.  White count is downtrend at 10.1. Currently, Afebrile but had temp of 99 at 0400 today. Pt is on Vanco and Cefipime. Cultures pending but negative until now. Hb is 7.8 , has been running low. PT/OT recommends SNF.  CCM and Palliative care on board and following. Husband has been receiving threats from other family members regarding making decisions. Husband transferred his role as Pt health care decision maker to Pt father Charlene [name]. Palliative care will arrange family meeting soon to discuss goals of care. Home meds carvedilol and Hydrochlorothiazide added to BP regimen (given from tube). Vitals:   09/02/20 0500 09/02/20 0600 09/02/20 0700 09/02/20 0741  BP: 134/77 (!) 156/86 (!) 175/98   Pulse: 83 (!) 104 (!) 103 87  Resp: 17 (!) 22 19 16   Temp:      TempSrc:      SpO2: 100% 100% 100% 100%  Weight:      Height:       CBC:  Recent Labs  Lab 08/26/20 2205 08/27/20 2020 08/31/20 0411 09/01/20 0347  WBC 8.3   < > 13.0* 10.1  NEUTROABS 3.5  --   --   --   HGB 11.7*   < > 8.5* 7.8*  HCT 37.3   < > 28.1* 24.4*  MCV 76.6*   < > 79.4* 78.5*  PLT 304   < > 171 154   < > = values in this interval not displayed.   Basic Metabolic Panel:  Recent Labs  Lab 08/29/20 1617 08/29/20 2114 08/31/20 0411  08/31/20 0941 09/01/20 0347 09/01/20 1014 09/01/20 2150 09/02/20 0409  NA 155*   < > 154*   < > 151*   < > 150* 151*  K  --    < > 3.6  --  3.8  --   --   --   CL  --    < > 120*  --  118*  --   --   --   CO2  --    < > 25  --  19*  --   --   --   GLUCOSE  --    < > 130*  --  178*  --   --   --   BUN  --    < > 24*  --  26*  --   --   --   CREATININE  --    < > 1.13*  --  1.04*  --   --   --   CALCIUM  --    < > 9.1  --  8.6*  --   --   --   MG 1.9  --  2.3  --   --   --   --   --  PHOS 3.2  --  2.4*  --  4.3  --   --   --    < > = values in this interval not displayed.   Lipid Panel:  Recent Labs  Lab 08/27/20 0251 08/28/20 0326 08/31/20 0411  CHOL 265*  --   --   TRIG 129   < > 114  HDL 72  --   --   CHOLHDL 3.7  --   --   VLDL 26  --   --   LDLCALC 321*  --   --    < > = values in this interval not displayed.   HgbA1c:  Recent Labs  Lab 08/27/20 0251  HGBA1C 6.0*   Urine Drug Screen:  Recent Labs  Lab 08/27/20 1854  LABOPIA POSITIVE*  COCAINSCRNUR NONE DETECTED  LABBENZ NONE DETECTED  AMPHETMU NONE DETECTED  THCU NONE DETECTED  LABBARB NONE DETECTED    Alcohol Level  Recent Labs  Lab 08/26/20 2225  ETH <10    IMAGING past 24 hours DG CHEST PORT 1 VIEW  Result Date: 09/01/2020 CLINICAL DATA:  Respiratory failure. EXAM: PORTABLE CHEST 1 VIEW COMPARISON:  08/30/2020. FINDINGS: Interval placement of ET tube with tip above the carina. The nasogastric tube tip is below the GE junction. Heart size appears within normal limits. No pleural effusion or edema. New asymmetric opacity is a identified within the medial right lung base which may represent infiltrate or atelectasis. IMPRESSION: 1. Satisfactory position of ET tube and nasogastric tube. 2. New asymmetric opacity within the medial right lung base, nonspecific. Electronically Signed   By: Signa Kell M.D.   On: 09/01/2020 10:48    PHYSICAL EXAM GENERAL: Well nourished, well developed middle-aged  Charlene [name]. Critically ill, Intubated but not sedated.  HEENT: - Normocephalic and atraumatic.  LUNGS - On Ventilator CV - no JVD, No Peripheral Edema, Tachycardia. ABDOMEN - Soft,  nondistended  Ext: warm, well perfused, intact peripheral pulses, no Peripheral edema. Skin- Yellow.  Neuro - Limited exam due to intubation and AMS.  Pt is intubated..  Not sedated.  Unresponsive.  Not following any commands.  Extensor posturing on B/L UE's (Lt>Rt) to sternal rub left greater than right. eyes closed, doesn't not open with voice or pain. Trace withdrawing of all 4 limbs to noxious stimuli. Hypotropia in Left eye, right eye mid position, left pupil 2 mm, right pupil 3 mm, doesn't react to light. Trace Corneal reflex b/l . Left facial droop. Sensation, coordination and gait not tested.  Plantars mute bilaterally ASSESSMENT/PLAN  Charlene [name] is a 55 y.o. female with history of hypertension with difficulty maintaining blood pressures and noncompliance to medications for various reasons, presented to the emergency room at [facility] for left-sided weakness and headaches.  She was appear to be confused and lethargic. She was seen by telemedicine evaluation by neurologist for concern for stroke and a noncontrast head CT showed a right thalamic/basal ganglia hemorrhage 3.2 x 2.1 x 2.5  with intraventricular extension.  He was transferred ED to ED for inpatient neurological evaluation and admission. BP high at  178/109 mmHg on Admission. CTA head & neck showed No intracranial large vessel occlusion or proximal high-grade arterial stenosis. 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 Repeat CT Head on 08/27/20 showed- Stable parenchymal hemorrhage in the right thalamus and basal ganglia with intraventricular extension. ECHO LVEF 60-65%. mild concentric left ventricular hypertrophy. HgbA1c 6.0. LDL 167. Repeat CT  head (08/27/20 )showed Stable  parenchymal hemorrhage in the right thalamus and basal ganglia with intraventricular extension. S/P ventricular catheter placement on 08/27/2020. Following that Pt had labored breathing so intubated. CT head repeat post procedure didn't demonstrate any increase in size of hemorrhage.  3% normal saline was stopped. PT recommends SNF. MRA normal, MRI showed scattered b/l hippocampus, posterior CC, bilateral thalamus, and upper pons.  . Pt is on Vanco and Cefipime due to fever. Cultures pending but negative until now. . Goals of care discussed with family by Attending  and palliative care over the weekend. Palliative care and CCM on board.   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 S/P ventricular catheter placement on 08/27/2020.  Without improvement and ventriculostomy DC on 08/30/20  CT Head -  Right thalamic ICH with IVH in the third and fourth ventricles and nearly filling the right lateral ventricle. No hydrocephalus.   CTA head & neck - No intracranial large vessel occlusion or proximal high-grade arterial stenosis. 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   Repeat CT Head x 3 - Stable parenchymal hemorrhage in the right thalamus and basal ganglia with intraventricular extension as described.   2D ECHO LVEF 60-65%. mild concentric left ventricular hypertrophy  LDL 167  HgbA1c 6.0  Vitamin B12 301  S/P ventricular catheter placement on 08/27/2020. And dc on 08/30/20 due to lack of movement and nonfunctioning  VTE prophylaxis -  Lovenox  No antithrombotic prior to admission, now on No antithrombotic due to ICH.   Therapy recommendations: SNF  Disposition: TBD  Slight obstructive hydrocephalus  CT Head -  IVH in the third and fourth ventricles and nearly filling the right lateral ventricle.  Ventricle size slightly larger than her baseline CT in 04/2020  Repeat CT head x 3 stable ventricle  size.   NSG on board  S/P ventricular catheter placement on 08/27/2020.   Minimal drainage  Intracranial pressure not high  Neurosurgery clamped EVD 08/29/20 and dc on 08/30/20 aster f/u Ct stable  CCM following   Stroke - scattered b/l hippocampus, posterior CC, bilateral thalamus, and upper pons - likely due to deep penetrating vessel compression by focal mass-effect  MRI brain 2/11 right greater than left mesial temporal lobes / parahippocampal cortex, bilateral basal ganglia, and possibly the upper pons that are concerning for acute infarcts, most likely secondary to mass effect from the hemorrhage.  MRA Normal  EEG suggestive of cortical dysfunction in right hemisphere likely secondaru to underlyig bleed as well as severe diffuse encephalopathy, nonspecific etiology. No seizures or epileptiform discharges  Off 3% saline   Na goal 150-155  Na 140->146->151>154>151  Hypertensive emergency hypotension  Home meds: Coreg 6.25 mg and Hydrochlorothiazide 25 mg daily  Unstable  . BP goal SP <160 mmHG for now . Off Claviprex.  . Continue labetalol and levophed PRN.  Marland Kitchen Long-term BP goal normotensive . Start Coreg 6.25 BID from tube.  . Start Hydrochlorothiazide 25 mg Daily from tube Hyperlipidemia  Home meds: None   LDL 167, goal < 70  Hold starting statin due to acute ICH  Consider statin at discharge  AKI  Cre 1.09 -> 1.94>1.09>1.04  Continue IV fluid  Foley placed on 08/28/20  BMP monitoring  Avoid Nephrotoxic drugs.  Anemia   Hb 7.8   CCM aware.  Monitor and transfuse per protocol.  Fever  On Vancomycin and Cefipime.  Monitor Temp  WBC  down trending (13.0 >10.1)  Blood C/S pending - Negative until now.  Resp sec c/s pending   Other Stroke Risk Factors  Noncompliance  I have personally obtained history,examined this patient, reviewed notes, independently viewed imaging studies, participated in medical decision making and plan of  care.ROS completed by me personally and pertinent positives fully documented  I have made any additions or clarifications directly to the above note. Agree with note above.  Patient neurological exam remains very poor and she is unresponsive and is extensor posturing left more than right on stimulation and continues to have tonic downward gaze deviation.  Unreactive pupils and very sluggish corneals.  She remains on ventilatory support for respiratory failure and on antibiotics for infection.  Patient's husband has given healthcare power of attorney not to patient's father Charlene [name] due to significant discord in the family and palliative care is working with them to hopefully set up a family meeting to discuss goals of care soon.  Wean Cleviprex drip and and carvedilol and hydrochlorothiazide patient's home medications to feeding tube.This patient is critically ill and at significant risk of neurological worsening, death and care requires constant monitoring of vital signs, hemodynamics,respiratory and cardiac monitoring, extensive review of multiple databases, frequent neurological assessment, discussion with family, other specialists and medical decision making of high complexity.I have made any additions or clarifications directly to the above note.This critical care time does not reflect procedure time, or teaching time or supervisory time of PA/NP/Med Resident etc but could involve care discussion time.  I spent 30 minutes of neurocritical care time  in the care of  this patient.     Delia HeadyPramod Sethi, MD Medical Director [facility] Pager: [phone] 09/02/2020 1:44 PM   To contact Stroke Continuity provider, please refer to [email or web address]. After hours, contact General Neurology

## 2020-09-03 DIAGNOSIS — J9620 Acute and chronic respiratory failure, unspecified whether with hypoxia or hypercapnia: Secondary | ICD-10-CM

## 2020-09-03 LAB — GLUCOSE, CAPILLARY
Glucose-Capillary: 140 mg/dL — ABNORMAL HIGH (ref 70–99)
Glucose-Capillary: 144 mg/dL — ABNORMAL HIGH (ref 70–99)
Glucose-Capillary: 146 mg/dL — ABNORMAL HIGH (ref 70–99)
Glucose-Capillary: 159 mg/dL — ABNORMAL HIGH (ref 70–99)
Glucose-Capillary: 161 mg/dL — ABNORMAL HIGH (ref 70–99)
Glucose-Capillary: 169 mg/dL — ABNORMAL HIGH (ref 70–99)

## 2020-09-03 LAB — CBC WITH DIFFERENTIAL/PLATELET
Abs Immature Granulocytes: 0.11 10*3/uL — ABNORMAL HIGH (ref 0.00–0.07)
Basophils Absolute: 0 10*3/uL (ref 0.0–0.1)
Basophils Relative: 0 %
Eosinophils Absolute: 0.1 10*3/uL (ref 0.0–0.5)
Eosinophils Relative: 1 %
HCT: 22.7 % — ABNORMAL LOW (ref 36.0–46.0)
Hemoglobin: 7.4 g/dL — ABNORMAL LOW (ref 12.0–15.0)
Immature Granulocytes: 1 %
Lymphocytes Relative: 12 %
Lymphs Abs: 1.3 10*3/uL (ref 0.7–4.0)
MCH: 24.9 pg — ABNORMAL LOW (ref 26.0–34.0)
MCHC: 32.6 g/dL (ref 30.0–36.0)
MCV: 76.4 fL — ABNORMAL LOW (ref 80.0–100.0)
Monocytes Absolute: 0.8 10*3/uL (ref 0.1–1.0)
Monocytes Relative: 7 %
Neutro Abs: 9 10*3/uL — ABNORMAL HIGH (ref 1.7–7.7)
Neutrophils Relative %: 79 %
Platelets: 207 10*3/uL (ref 150–400)
RBC: 2.97 MIL/uL — ABNORMAL LOW (ref 3.87–5.11)
RDW: 15.5 % (ref 11.5–15.5)
WBC: 11.4 10*3/uL — ABNORMAL HIGH (ref 4.0–10.5)
nRBC: 0 % (ref 0.0–0.2)

## 2020-09-03 LAB — BASIC METABOLIC PANEL
Anion gap: 11 (ref 5–15)
BUN: 34 mg/dL — ABNORMAL HIGH (ref 6–20)
CO2: 28 mmol/L (ref 22–32)
Calcium: 8.7 mg/dL — ABNORMAL LOW (ref 8.9–10.3)
Chloride: 108 mmol/L (ref 98–111)
Creatinine, Ser: 1.07 mg/dL — ABNORMAL HIGH (ref 0.44–1.00)
GFR, Estimated: >60 mL/min (ref >60)
Glucose, Bld: 170 mg/dL — ABNORMAL HIGH (ref 70–99)
Potassium: 3.4 mmol/L — ABNORMAL LOW (ref 3.5–5.1)
Sodium: 147 mmol/L — ABNORMAL HIGH (ref 135–145)

## 2020-09-03 LAB — CULTURE, BLOOD (ROUTINE X 2)
Culture: NO GROWTH
Culture: NO GROWTH

## 2020-09-03 LAB — SODIUM
Sodium: 147 mmol/L — ABNORMAL HIGH (ref 135–145)
Sodium: 146 mmol/L — ABNORMAL HIGH (ref 135–145)
Sodium: 146 mmol/L — ABNORMAL HIGH (ref 135–145)

## 2020-09-03 LAB — TRIGLYCERIDES: Triglycerides: 119 mg/dL (ref <150)

## 2020-09-03 MED ORDER — POTASSIUM CHLORIDE 20 MEQ PO PACK
40.0000 meq | PACK | Freq: Once | ORAL | Status: AC
  Administered 2020-09-03: 40 meq
  Filled 2020-09-03: qty 2

## 2020-09-03 MED ORDER — OSMOLITE 1.2 CAL PO LIQD
1000.0000 mL | ORAL | Status: DC
  Administered 2020-09-04 – 2020-12-25 (×92): 1000 mL
  Filled 2020-09-03 (×76): qty 1000

## 2020-09-03 MED ORDER — PROSOURCE TF PO LIQD
45.0000 mL | Freq: Every day | ORAL | Status: DC
  Administered 2020-09-04 – 2021-02-04 (×154): 45 mL
  Filled 2020-09-03 (×153): qty 45

## 2020-09-03 NOTE — Progress Notes (Signed)
PT Cancellation Note  Patient Details Name: Charlene [name] MRN: [id number] DOB: 02-09-1966   Cancelled Treatment:    Reason Eval/Treat Not Completed: Other (comment) Family meeting with palliative today. However, upon arrival of PT, pt family visiting still from family meeting, and PT did not want to disturb the pt's limited time with all of her family. Will continue to follow as appropriate.   Deland Pretty, DPT   Acute Rehabilitation Department Pager #: [phone]   Charlene [name] 09/03/2020, 5:49 PM

## 2020-09-03 NOTE — Progress Notes (Signed)
This chaplain was invited by the PMT to join Pt. family meeting. The Pt. spouse, parents, children, sister, and close friends are present with the medical team.   The chaplain understands the Pt. family has a strong faith and their faith informs the family's decision making for the Pt.   [id number] The chaplain returned to the unit waiting area. The chaplain offered F/U spiritual care to the family and recognized the Pt. father role as a Designer, multimedia.   This chaplain is available for F/U spiritual care as needed

## 2020-09-03 NOTE — Progress Notes (Signed)
NAME:  Charlene [name], MRN:  [id number], DOB:  March 18, 1966, LOS: 7 ADMISSION DATE:  08/26/2020, CONSULTATION DATE:  2/10 REFERRING MD:  Dr. Roda Shutters, CHIEF COMPLAINT:  ICH   Brief History:  55 year old female admitted with hypertensive R thalamic/basal ganglia ICH. Resultant obstructive hydrocephalus prompting EVD placement 2/10. Subsequently, 2/10 she had acute mental status change to obtundation and respiratory arrest resulting in intubation.  Past Medical History:  HTN, medication noncompliance  Significant Hospital Events:  2/09 Admit for ICH 2/10 EVD placed, later intubated due to obtundation and respiratory arrest 2/11 MRI Brain, hypertonic saline d/c'd, EVD not working.  Husband did not consent to PICC  2/13 EVD removed 2/17 GOC discussion with family, PMT  Consults:  Stroke PCCM Neurosurgery  Procedures:  ETT 2/10 >> EVD 2/10 >> non functioning 2/11 at 1500; clamped 2/12 am >> 2/13  Significant Diagnostic Tests:   CT Head 2/9 >> Acute hemorrhage with the epicenter in the right thalamus. Measurement is approximately 3.2 x 2.1 x 2.5 cm (volume 8.8 cm^3). Intraventricular penetration with blood filling the third and fourth ventricles and nearly filling the right lateral ventricle. No hydrocephalus.  CTA Head/Neck 2/10 >> Unchanged size of intraparenchymal hemorrhage centered in the right thalamus and basal ganglia with extension into the right lateral ventricle. 2. No intracranial arterial occlusion or high-grade stenosis. No dissection, aneurysm or hemodynamically significant stenosis of the carotid or vertebral arteries.  CT Head 2/10 >> Stable parenchymal hemorrhage in the right thalamus and basal ganglia with intraventricular extension as described. No new focal abnormality is noted.  CT Head 2/10 (post intubation) >> redemonstrated intraparenchymal hemorrhage centered within the R thalamus/basal ganglia extending into the cerebral peduncle/midbrain with associated  intraventricular extension and hemorrhage in the R lateral ventricle and fourth ventricle, overall not significantly changed from prior; stable mass effect, 47mm of R to L midline shift, postsurgical changes of ventriculostomy  TTE 2/10 >> LVEF 60 to 65%, LV has normal function, no regional wall motion abnormalities, mild concentric LVH.  RV systolic function is normal, normal pulmonary artery systolic pressure.   MRA head 2/11 >>Negative intracranial MR angiography. No evidence of aneurysm or high flow vascular malformation to explain the intraparenchymal hemorrhage   MRI brain 2/11 >> Similar intraparenchymal hemorrhage centered within the right thalamus and basal ganglia extending into the right midbrain with intraventricular extension, effacement of the prepontine cistern and similar 4 mm of leftward midline shift; separate areas of restricted diffusion and edema involving the R > L mesial temporal lobes/parahippocampal cortex, bilateral basal ganglia, and possibly the upper pons that are c/f acute infarcts, most likely secondary to mass effect from hemorrhage.  Micro Data:  2/9 COVID + Influenza negative 2/10 MRSA PCR negative 2/12 Tracheal aspirate >> 2/12 BCx 2 >>  Antimicrobials:  Cefepime 2/12 >> 2/15 Vanc 2/12 >> 2/15  Interim History / Subjective:  No significant events overnight Remains on minimal sedation Mostly unresponsive, does withdraw from suctioning nose this AM Ongoing extensor posturing PMT/GOC discussion with family today at 1100  Objective   Blood pressure 109/71, pulse 98, temperature 98.9 F (37.2 C), temperature source Axillary, resp. rate 17, height 5\' 5"  (1.651 m), weight 63.5 kg, last menstrual period 03/03/2014, SpO2 100 %, unknown if currently breastfeeding.    Vent Mode: PRVC FiO2 (%):  [30 %] 30 % Set Rate:  [14 bmp] 14 bmp Vt Set:  [450 mL] 450 mL PEEP:  [5 cmH20] 5 cmH20 Plateau Pressure:  [14 cmH20-16 cmH20] 16 cmH20  Intake/Output Summary (Last  24 hours) at 09/03/2020 0805 Last data filed at 09/03/2020 0600 Gross per 24 hour  Intake 1260.53 ml  Output 925 ml  Net 335.53 ml   Filed Weights   08/26/20 2237 08/29/20 0500 08/30/20 0500  Weight: 61.2 kg 64.1 kg 63.5 kg   Examination: General: Critically ill adult female, intubated/unresponsive, NAD. HEENT: Anicteric sclera, R pupil remains fixed. L pupil reacts to light. L facial asymmetry/lip swelling. Moist mucous membranes, ETT in place. Neuro: Unresponsive to verbal/tactile stimuli, does withdraw minimally to noxious stimuli (suctioning, pain). R pupil as above with L facial droop. +Corneals, extensor posturing L > R CV: S1S2, RRR, no m/g/r. PULM: Breathing even and unlabored on vent (PEEP 5, FiO2 30%); breath sounds coarse c/w MV. GI: Soft, mild distention, normoactive BS. Extremities: Extensor posturing BUE (L > R) Skin: Warm/dry, no rashes.  Resolved Hospital Problem list    Assessment & Plan:  Intraparenchymal hemorrhage with right thalamic hemorrhage with extension into the third fourth and right lateral ventricle Obstructive hydrocephalus Hypertensive emergency Presented to [facility] ED 2/9 for HA/L-sided weakness with CT revealing R thalamic/basal ganglia hemorrhage with intraventricular extension; transferred to [facility] for Neuro evaluation. Neurosurgery consulted and EVD placed 2/10 (removed 2/13 for nonfunction). Obtunded 2/10 requiring intubation with progressively worsening neuro exam. - Neuro primary - Serial neuro exam - Guarded prognosis for meaningful recovery - PMT/GOC discussion today, 2/17 at 1100  Acute hypoxemic respiratory failure secondary to CVA - Full vent support - VAP bundle - HOB > 30 degrees - Possible trach, pending further discussion of prognosis/family wishes  Acute kidney injury, resolving Electrolyte derangements Hypernatremia AKI continues to improve. Previously on hypertonic saline for cerebral edema with intentional iatrogenic Na increase to  150s. - Trend BMP/lytes  - Supplement as indicated - Avoid nephrotoxic agents as able  Central fever Previously with fever spiking to 101F. On Vanc/Cefepime 2/12-2/15. Cultures remained negative. - Continue to monitor fever curve (afebrile last 24H)  Anemia - Continue to monitor - Transfuse per protocol  Best practice (evaluated daily)  Diet: TFs Pain/Anxiety/Delirium protocol (if indicated): Fentanyl PRN, off other sedation VAP protocol (if indicated): In place DVT prophylaxis: SCDs GI prophylaxis: PPI Glucose control: SSI Mobility: Bedrest Disposition: ICU  Goals of Care:  Last date of multidisciplinary goals of care discussion: Per primary Family and staff present:  Summary of discussion:  Follow up goals of care discussion due: Per primary, Palliative care involved.  Patient has 4 adult children from prior marriage and two teenage children in current marriage.  Possibly some discord between the two. PMT consulted, following. Per PMT note, plan for family meeting/GOC discussion 2/17 at 1100.  Code Status: Full  Critical care time: 36 minutes  Tim Lair, [facility] 09/03/20 8:05 AM  Please see [email or web address] for pager details.

## 2020-09-03 NOTE — Progress Notes (Signed)
Patient ID: Charlene [name], female   DOB: 05/08/1966, 55 y.o.   MRN: [id number]  Medical records reviewed   55 year old female with past medical history as below, which is significant for uncontrolled  hypertension and reported medication noncompliance.  Presented to the emergency department at [facility] 2/9 with c/o headache and left-sided weakness.  STAT CT Head demonstrated right thalamic/basal ganglia hemorrhage with intraventricular extension.  She was transferred to [facility] emergency department for in person neurology evaluation. There was evidence of obstructive hydrocephalus and neurosurgery was consulted. External ventricular drain was placed 2/10. In the hours following she became increasingly obtunded. PCCM was consulted. Upon PCCM arrival the patient became apneic and was intubated. EVD removed 2/13  Today is day 7 of this hospitalization.  Patient remains intubated/respiratory failure, with extensor posturing, unresponsive to gentle touch and verbal stimuli.  Likely long term poor prognosis for meaningful recovery.  Case discussed with medical team and bedside RN/[name].  PMT coordinated and managed family meeting.  Met with large family as scheduled for medical update hoping to help family with deeper understanding of the medical situation and limitations of medical interventions in this unfortunate situation to improve quality of life for Charlene [name].   Family present included her husband/Charlene [name], parents/Charlene [name] and Charlene [name], daughter/Charlene [name], son/Charlene [name], sister/Charlene [name], aunt/Charlene [name].  Dr Leonie Man, Dr Cyndy Freeze and Dr Ander Slade detailed patient's current medical situation. Information and education  regarding diagnosis, prognosis, treatment options/ decisions and anticipatory care needs  discussed.   Questions and concerns addressed in detail.  Family permitted to visit briefly at bedside.  Of note:   Charlene [name]/pateitn's father  is now designated hospital  visitor.   However Charlene [name]/Husband remains legal decision maker at this time until if and when documentation supports differently.  Emotional support and therapeutic listening.  Eduction regarding the difference between a continued aggressive medical intervention path and a more palliative comfort path was offered.  No decisions reached today as far as care plan,  I let the family know I was out of the hospital until Sunday and would f/u at that time.  Encouraged to call PMT team phone in the interim.  Questions and concerns addressed to the best of my availablity  Total time spent on the unit was 90 minutes   PMT will continue to support holistically  Greater than 50% of the time was spent in counseling and coordination of care  Wadie Lessen NP  Palliative Medicine Team Team Phone # [phone] Pager [phone]

## 2020-09-03 NOTE — Progress Notes (Signed)
Nutrition Follow-up  DOCUMENTATION CODES:   Not applicable  INTERVENTION:   Tube feeding via OG tube: Osmolite 1.2 at 55 ml/h (1320 ml per day) 45 ml ProSource TF daily  Provides 1624 kcal, 84 gm protein, 1070 ml free water daily   NUTRITION DIAGNOSIS:   Inadequate oral intake related to inability to eat as evidenced by NPO status. Ongoing.   GOAL:   Patient will meet greater than or equal to 90% of their needs Met with TF.   MONITOR:   TF tolerance,Vent status  REASON FOR ASSESSMENT:   Consult,Ventilator Enteral/tube feeding initiation and management  ASSESSMENT:   Pt with PMH of HTN with medication noncompliance admitted with hypertensive thalamic ICH.   Pt discussed during ICU rounds and with RN.  Pt with hypertensive R thalamic/basal ganglia ICH. Per MD unresponsive to verbal stimuli with extensor posturing to stimulation. Prognosis poor for meaningful recovery is poor.    2/10 s/p EVD placement and intubation for respiratory arrest 2/13 EVD removed 2/17 palliative care consult; family requesting tx to [facility]   Patient is currently intubated on ventilator support MV: 8 L/min Temp (24hrs), Avg:99.7 F (37.6 C), Min:98.4 F (36.9 C), Max:101.5 F (38.6 C)  Medications reviewed and include: colace, miralax, senokot-s Hypertonic saline  Labs reviewed: Na 147, K+ 3.4 CBG's: 140-161  OG tube: tip in proximal stomach   Current TF:  Vital AF 1.2 @ 50 ml/hr  Diet Order:   Diet Order            Diet NPO time specified  Diet effective now                 EDUCATION NEEDS:   No education needs have been identified at this time  Skin:  Skin Assessment: Reviewed RN Assessment  Last BM:  2/16 large; rectal tube  Height:   Ht Readings from Last 1 Encounters:  08/27/20 _0  (1.651 m)    Weight:   Wt Readings from Last 1 Encounters:  08/30/20 63.5 kg    Ideal Body Weight:  56.8 kg  BMI:  Body mass index is 23.3 kg/m.  Estimated  Nutritional Needs:   Kcal:  1600  Protein:  75-90 grams  Fluid:  >1.5 L/day  Lockie Pares., RD, LDN, CNSC See AMiON for contact information

## 2020-09-03 NOTE — Progress Notes (Signed)
STROKE TEAM PROGRESS NOTE   INTERVAL HISTORY No acute events overnight.  Neuro neurological exam with  bilateral extensor posturing (Lt >Rt). Pt is examined at the bedside. No family present in the room. She remains intubated for respiratory failure, eyes closed, doesn't not open with voice or pain.  Hypotropia in Left eye (worse than yesterday), right eye mid position, left pupil 2 mm, right pupil 3 mm and fixed, doesn't react to light. Trace corneal reflex b/l. Mild Left facial droop.  Trace withdrawing of all 4 limbs to noxious stimuli. Some respiratory effort, gag and cough intact.  BP has been running high from systolic between 16-10998-190 and diastolic between 60-45458-108 mm HG. Serum sodium is 147 and she has been off hypertonic saline now for several days.  White count is 11.4. Hb 7.4, has been running low. Currently, Afebrile but had temp of 101.5 at 0000 today. Cultures negative. Vanco and Cefipime stopped.  PT/OT recommends SNF.  CCM and Palliative care on board and following. Pt father Amanda PeaJohny Carre is  Pt health care decision maker. Family meeting held today.  Vitals:   09/03/20 0900 09/03/20 1000 09/03/20 1115 09/03/20 1200  BP: (!) 158/91 120/68    Pulse: (!) 106 100    Resp: (!) 24 19    Temp:    99.7 F (37.6 C)  TempSrc:    Axillary  SpO2: 100% 100% 100%   Weight:      Height:       CBC:  Recent Labs  Lab 09/01/20 0347 09/03/20 0944  WBC 10.1 11.4*  NEUTROABS  --  9.0*  HGB 7.8* 7.4*  HCT 24.4* 22.7*  MCV 78.5* 76.4*  PLT 154 207   Basic Metabolic Panel:  Recent Labs  Lab 08/29/20 1617 08/29/20 2114 08/31/20 0411 08/31/20 0941 09/01/20 0347 09/01/20 1014 09/03/20 0353 09/03/20 0944  NA 155*   < > 154*   < > 151*   < > 147* 147*  K  --    < > 3.6  --  3.8  --   --  3.4*  CL  --    < > 120*  --  118*  --   --  108  CO2  --    < > 25  --  19*  --   --  28  GLUCOSE  --    < > 130*  --  178*  --   --  170*  BUN  --    < > 24*  --  26*  --   --  34*  CREATININE  --     < > 1.13*  --  1.04*  --   --  1.07*  CALCIUM  --    < > 9.1  --  8.6*  --   --  8.7*  MG 1.9  --  2.3  --   --   --   --   --   PHOS 3.2  --  2.4*  --  4.3  --   --   --    < > = values in this interval not displayed.   Lipid Panel:  Recent Labs  Lab 09/03/20 0353  TRIG 119   HgbA1c:  No results for input(s): HGBA1C in the last 168 hours. Urine Drug Screen:  Recent Labs  Lab 08/27/20 1854  LABOPIA POSITIVE*  COCAINSCRNUR NONE DETECTED  LABBENZ NONE DETECTED  AMPHETMU NONE DETECTED  THCU NONE DETECTED  LABBARB NONE DETECTED  Alcohol Level  No results for input(s): ETH in the last 168 hours.  IMAGING past 24 hours No results found.  PHYSICAL EXAM GENERAL: Well nourished, well developed middle-aged African-American lady. Critically ill, Intubated but not sedated.  HEENT: - Normocephalic and atraumatic.  LUNGS - On Ventilator CV - no JVD, No Peripheral Edema, Tachycardia. ABDOMEN - Soft,  nondistended  Ext: warm, well perfused, intact peripheral pulses, no Peripheral edema. Skin- Yellow.  Neuro - Limited exam due to intubation and AMS.  Pt is intubated..  Not sedated.  Unresponsive.  Not following any commands.  Extensor posturing on B/L UE's (Lt>Rt) to sternal rub left greater than right. eyes closed, doesn't not open with voice or pain. Trace withdrawing of all 4 limbs to noxious stimuli. 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 Trace Corneal reflex b/l . Left facial droop. Sensation, coordination and gait not tested.  Plantars mute bilaterally. Some respiratory effort, gag and cough intact.  ASSESSMENT/PLAN  Ms. CHOLE DRIVER is a 55 y.o. female with history of hypertension with difficulty maintaining blood pressures and noncompliance to medications for various reasons, presented to the emergency room at [facility] for left-sided weakness and headaches.  She was  appear to be confused and lethargic. She was seen by telemedicine evaluation by neurologist for concern for stroke and a noncontrast head CT showed a right thalamic/basal ganglia hemorrhage 3.2 x 2.1 x 2.5  with intraventricular extension.  He was transferred ED to ED for inpatient neurological evaluation and admission. BP high at  178/109 mmHg on Admission. CTA head & neck showed No intracranial large vessel occlusion or proximal high-grade arterial stenosis. 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 Repeat CT Head on 08/27/20 showed- Stable parenchymal hemorrhage in the right thalamus and basal ganglia with intraventricular extension. ECHO LVEF 60-65%. mild concentric left ventricular hypertrophy. HgbA1c 6.0. LDL 167. Repeat CT head (08/27/20 )showed Stable parenchymal hemorrhage in the right thalamus and basal ganglia with intraventricular extension. S/P ventricular catheter placement on 08/27/2020. Following that Pt had labored breathing so intubated. CT head repeat post procedure didn't demonstrate any increase in size of hemorrhage.  3% normal saline was stopped. PT recommends SNF. MRA normal, MRI showed scattered b/l hippocampus, posterior CC, bilateral thalamus, and upper pons. Pt is on Vanco and Cefipime due to fever but stopped after negative cultures. Cultures negative until now. Goals of care discussed with family by Attending  and palliative care. Family meeting held on 09/03/20. Palliative care and CCM on board.   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 S/P ventricular catheter placement on 08/27/2020.  Without improvement and ventriculostomy DC on 08/30/20  CT Head -  Right thalamic ICH with IVH in the third and fourth ventricles and nearly filling the right lateral ventricle. No hydrocephalus.   CTA head & neck - No intracranial large vessel occlusion or proximal high-grade arterial stenosis. 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   Repeat CT Head x 3 - Stable parenchymal hemorrhage in the right thalamus and basal ganglia with intraventricular extension as described.   2D ECHO LVEF 60-65%. mild concentric left ventricular hypertrophy  LDL 167  HgbA1c 6.0  Vitamin B12 301  S/P ventricular catheter placement on 08/27/2020. And dc on 08/30/20 due to lack of movement and  nonfunctioning  VTE prophylaxis -  Lovenox  No antithrombotic prior to admission, now on No antithrombotic due to ICH.   Therapy recommendations: SNF  Disposition: TBD  Slight obstructive hydrocephalus  CT Head -  IVH in the third and fourth ventricles and nearly filling the right lateral ventricle.  Ventricle size slightly larger than her baseline CT in 04/2020  Repeat CT head x 3 stable ventricle size.   NSG on board  S/P ventricular catheter placement on 08/27/2020.   Minimal drainage  Intracranial pressure not high  Neurosurgery clamped EVD 08/29/20 and dc on 08/30/20 aster f/u Ct stable  CCM following   Stroke - scattered b/l hippocampus, posterior CC, bilateral thalamus, and upper pons - likely due to deep penetrating vessel compression by focal mass-effect  MRI brain 2/11 right greater than left mesial temporal lobes / parahippocampal cortex, bilateral basal ganglia, and possibly the upper pons that are concerning for acute infarcts, most likely secondary to mass effect from the hemorrhage.  MRA Normal  EEG suggestive of cortical dysfunction in right hemisphere likely secondaru to underlyig bleed as well as severe diffuse encephalopathy, nonspecific etiology. No seizures or epileptiform discharges  Off 3% saline   Na goal 150-155  Na 140->146->151>154>151>147  Hypertensive emergency hypotension  Home meds: Coreg 6.25 mg and Hydrochlorothiazide 25 mg daily  Unstable  . BP goal SP <160 mmHG for now . Off Claviprex.  . Continue labetalol and  levophed PRN.  Marland Kitchen Long-term BP goal normotensive . Continue Coreg 6.25 BID from tube.  . Continue Hydrochlorothiazide 25 mg Daily from tube Hyperlipidemia  Home meds: None   LDL 167, goal < 70  Hold starting statin due to acute ICH  Consider statin at discharge  AKI  Cre 1.09 -> 1.94>1.09>1.04>1.07  Continue IV fluid  Foley placed on 08/28/20  BMP monitoring  Avoid Nephrotoxic drugs.  Replete electrolytes as indicated. Anemia   Hb 7.4  CCM aware.  Monitor and transfuse per protocol.  Fever  Vancomycin and Cefipime stopped.  Monitor Temp  WBC down trending (13.0 >10.1)  Cultures Negative  Resp sec c/s pending   Other Stroke Risk Factors  Noncompliance I have personally obtained history,examined this patient, reviewed notes, independently viewed imaging studies, participated in medical decision making and plan of care.ROS completed by me personally and pertinent positives fully documented  I have made any additions or clarifications directly to the above note. Agree with note above.  Patient neurological exam remains quite poor with spontaneous posturing and no purposeful activity.  I had a long 1 hour meeting with multiple family members along with critical care MD, Dr. Mikal Plane neurosurgery as well as palliative care nurse practitioner discussed patient's extremely poor prognosis and no realistic chance of making meaningful improvement even with prolonged ventilatory support.  Patient's father will tell power of attorney requested that I call [facility] transfer center to request transfer.  I called [phone] spoke to operator told me there were no ICU beds at [facility] and hence they were not entertaining any outside transfers and request we call back in 1 to 2 days.  Plan to continue supportive care till then. This patient is critically ill and at significant risk of neurological worsening, death and care requires constant monitoring of vital signs, hemodynamics,respiratory  and cardiac monitoring, extensive review of multiple databases, frequent neurological assessment, discussion with family, other specialists and medical decision making of high complexity.I have made any additions or clarifications directly to the above note.This critical care time  does not reflect procedure time, or teaching time or supervisory time of PA/NP/Med Resident etc but could involve care discussion time.  I spent 90 minutes of neurocritical care time  in the care of  this patient.     Delia Heady, MD Medical Director [facility] Pager: [phone] 09/03/2020 6:25 PM     To contact Stroke Continuity provider, please refer to [email or web address]. After hours, contact General Neurology

## 2020-09-03 NOTE — OR Nursing (Signed)
Patient's daughter has requested that [facility] transfer center be consulted and medical records available to them if needed.  I called [facility] transfer center and was told that they are in Red status and not accepting any non-emergent patients that are not already established with [facility]. I spoke with patient's husband, Charlene [name], and notified him of this policy. Dr. Pearlean Brownie will still attempt to speak with [facility] transfer center [phone]) in the morning and get back to Charlene [name] or patient's father, Charlene [name], in the morning. Charlene [name] C 3:33 PM

## 2020-09-04 LAB — GLUCOSE, CAPILLARY
Glucose-Capillary: 127 mg/dL — ABNORMAL HIGH (ref 70–99)
Glucose-Capillary: 161 mg/dL — ABNORMAL HIGH (ref 70–99)
Glucose-Capillary: 171 mg/dL — ABNORMAL HIGH (ref 70–99)
Glucose-Capillary: 149 mg/dL — ABNORMAL HIGH (ref 70–99)
Glucose-Capillary: 164 mg/dL — ABNORMAL HIGH (ref 70–99)
Glucose-Capillary: 156 mg/dL — ABNORMAL HIGH (ref 70–99)

## 2020-09-04 LAB — SODIUM
Sodium: 145 mmol/L (ref 135–145)
Sodium: 146 mmol/L — ABNORMAL HIGH (ref 135–145)

## 2020-09-04 MED ORDER — FREE WATER
100.0000 mL | Status: DC
  Administered 2020-09-04 – 2020-12-18 (×621): 100 mL

## 2020-09-04 NOTE — Progress Notes (Addendum)
STROKE TEAM PROGRESS NOTE   INTERVAL HISTORY No acute events overnight.  Neuro neurological exam with  bilateral extensor posturing (Lt >Rt). Pt is examined at the bedside. No family present in the room. She remains intubated for respiratory failure, eyes closed, doesn't not open with voice or pain.  Hypotropia in Left eye, right eye mid position, left pupil smaller than right pupil and fixed, doesn't react to light. Trace corneal reflex b/l. Mild Left facial droop.  Trace withdrawing of all 4 limbs to noxious stimuli. Some respiratory effort, gag and cough intact.  BP has been running high from systolic between 94- 213 and diastolic between 16-109 mm HG. Serum sodium is 146 and she has been off hypertonic saline now for several days.  White count was 11.4 yesterday. Hb 7.4, has been running low. Pt had temp of 100.8 at 800. Cultures negative. PT/OT recommends SNF.  CCM and Palliative care on board and following. Pt father Charlene [name] is  Pt health care decision maker. Family meeting held yesterday, Family want to transfer Pt to [facility]. [facility] was called yesterday and No ICU beds are available at this time.   Vitals:   09/04/20 0751 09/04/20 0800 09/04/20 1000 09/04/20 1105  BP:   (!) 169/94   Pulse:   (!) 112   Resp:   (!) 22   Temp:  (!) 100.8 F (38.2 C)    TempSrc:  Axillary    SpO2: 100%  100% 100%  Weight:      Height:       CBC:  Recent Labs  Lab 09/01/20 0347 09/03/20 0944  WBC 10.1 11.4*  NEUTROABS  --  9.0*  HGB 7.8* 7.4*  HCT 24.4* 22.7*  MCV 78.5* 76.4*  PLT 154 207   Basic Metabolic Panel:  Recent Labs  Lab 08/29/20 1617 08/29/20 2114 08/31/20 0411 08/31/20 0941 09/01/20 0347 09/01/20 1014 09/03/20 0944 09/03/20 1616 09/04/20 0236 09/04/20 0959  NA 155*   < > 154*   < > 151*   < > 147*   < > 145 146*  K  --    < > 3.6  --  3.8  --  3.4*  --   --   --   CL  --    < > 120*  --  118*  --  108  --   --   --   CO2  --    < > 25  --  19*  --  28  --   --   --    GLUCOSE  --    < > 130*  --  178*  --  170*  --   --   --   BUN  --    < > 24*  --  26*  --  34*  --   --   --   CREATININE  --    < > 1.13*  --  1.04*  --  1.07*  --   --   --   CALCIUM  --    < > 9.1  --  8.6*  --  8.7*  --   --   --   MG 1.9  --  2.3  --   --   --   --   --   --   --   PHOS 3.2  --  2.4*  --  4.3  --   --   --   --   --    < > =  values in this interval not displayed.   Lipid Panel:  Recent Labs  Lab 09/03/20 0353  TRIG 119   HgbA1c:  No results for input(s): HGBA1C in the last 168 hours. Urine Drug Screen:  No results for input(s): LABOPIA, COCAINSCRNUR, LABBENZ, AMPHETMU, THCU, LABBARB in the last 168 hours.  Alcohol Level  No results for input(s): ETH in the last 168 hours.  IMAGING past 24 hours No results found.  PHYSICAL EXAM GENERAL: Well nourished, well developed middle-aged African-American lady. Critically ill, Intubated but not sedated.  HEENT: - Normocephalic and atraumatic.  LUNGS - On Ventilator CV - no JVD, No Peripheral Edema, Tachycardia. ABDOMEN - Soft,  nondistended  Ext: warm, well perfused, intact peripheral pulses, no Peripheral edema. Skin- Yellow.  Neuro - Limited exam due to intubation and AMS.  Pt is intubated..  Not sedated.  Unresponsive.  Not following any commands.  Extensor posturing on B/L UE's (Lt>Rt) to sternal rub left greater than right. eyes closed, doesn't not open with voice or pain. Trace withdrawing of all 4 limbs to noxious stimuli. 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 Trace Corneal reflex b/l . Left facial droop. Sensation, coordination and gait not tested.  Plantars mute bilaterally. Some respiratory effort, gag and cough intact.  ASSESSMENT/PLAN  Charlene [name] is a 55 y.o. female with history of hypertension with difficulty maintaining blood pressures and noncompliance to medications for various reasons, presented to  the emergency room at [facility] for left-sided weakness and headaches.  She was appear to be confused and lethargic. She was seen by telemedicine evaluation by neurologist for concern for stroke and a noncontrast head CT showed a right thalamic/basal ganglia hemorrhage 3.2 x 2.1 x 2.5  with intraventricular extension.  He was transferred ED to ED for inpatient neurological evaluation and admission. BP high at  178/109 mmHg on Admission. CTA head & neck showed No intracranial large vessel occlusion or proximal high-grade arterial stenosis. 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 Repeat CT Head on 08/27/20 showed- Stable parenchymal hemorrhage in the right thalamus and basal ganglia with intraventricular extension. ECHO LVEF 60-65%. mild concentric left ventricular hypertrophy. HgbA1c 6.0. LDL 167. Repeat CT head (08/27/20 )showed Stable parenchymal hemorrhage in the right thalamus and basal ganglia with intraventricular extension. S/P ventricular catheter placement on 08/27/2020. Following that Pt had labored breathing so intubated. CT head repeat post procedure didn't demonstrate any increase in size of hemorrhage.  3% normal saline was stopped. PT recommends SNF. MRA normal, MRI showed scattered b/l hippocampus, posterior CC, bilateral thalamus, and upper pons. Pt is on Vanco and Cefipime due to fever but stopped after negative cultures. Cultures negative until now. Goals of care discussed with family by Attending  and palliative care. Family meeting held on 09/03/20. Palliative care and CCM on board.   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 S/P ventricular catheter placement on 08/27/2020.  Without improvement and ventriculostomy DC on 08/30/20  CT Head -  Right thalamic ICH with IVH in the third and fourth ventricles and nearly filling the right lateral ventricle. No hydrocephalus.   CTA head & neck -  No intracranial large vessel occlusion or proximal high-grade arterial stenosis. 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   Repeat CT Head x 3 -  Stable parenchymal hemorrhage in the right thalamus and basal ganglia with intraventricular extension as described.   2D ECHO LVEF 60-65%. mild concentric left ventricular hypertrophy  LDL 167  HgbA1c 6.0  Vitamin B12 301  S/P ventricular catheter placement on 08/27/2020. And dc on 08/30/20 due to lack of movement and nonfunctioning  VTE prophylaxis -  Lovenox  No antithrombotic prior to admission, now on No antithrombotic due to ICH.   Therapy recommendations: SNF  Disposition: TBD  Slight obstructive hydrocephalus  CT Head -  IVH in the third and fourth ventricles and nearly filling the right lateral ventricle.  Ventricle size slightly larger than her baseline CT in 04/2020  Repeat CT head x 3 stable ventricle size.   NSG on board  S/P ventricular catheter placement on 08/27/2020.   Minimal drainage  Intracranial pressure not high  Neurosurgery clamped EVD 08/29/20 and dc on 08/30/20 aster f/u Ct stable  CCM following   Stroke - scattered b/l hippocampus, posterior CC, bilateral thalamus, and upper pons - likely due to deep penetrating vessel compression by focal mass-effect  MRI brain 2/11 right greater than left mesial temporal lobes / parahippocampal cortex, bilateral basal ganglia, and possibly the upper pons that are concerning for acute infarcts, most likely secondary to mass effect from the hemorrhage.  MRA Normal  EEG suggestive of cortical dysfunction in right hemisphere likely secondaru to underlyig bleed as well as severe diffuse encephalopathy, nonspecific etiology. No seizures or epileptiform discharges  Off 3% saline   Na goal 150-155  Na 140->146->151>154>151>147>146  Hypertensive emergency hypotension  Home meds: Coreg 6.25 mg and Hydrochlorothiazide 25 mg  daily  Unstable  . BP goal SP <160 mmHG for now . Off Claviprex.  . Continue labetalol and levophed PRN.  Marland Kitchen Long-term BP goal normotensive . Continue Coreg 6.25 BID from tube.  . Continue Hydrochlorothiazide 25 mg Daily from tube .  Hyperlipidemia  Home meds: None   LDL 167, goal < 70  Hold starting statin due to acute ICH  Consider statin at discharge  AKI  Cre 1.09 -> 1.94>1.09>1.04>1.07  Continue IV fluid  Foley placed on 08/28/20  BMP monitoring  Avoid Nephrotoxic drugs.  Replete electrolytes as indicated.   Anemia   Hb 7.4  CCM aware.  Monitor and transfuse per protocol.  Fever  Vancomycin and Cefipime stopped.  Monitor Temp  WBC 13.0 >10.1>11.4  Cultures Negative  Resp sec c/s pending  Hyperglycemia  Recent Labs    09/04/20 0323 09/04/20 0818 09/04/20 1218  GLUCAP 127* 161* 171*    Continue SSI  Other Stroke Risk Factors  Noncompliance   I have personally obtained history,examined this patient, reviewed notes, independently viewed imaging studies, participated in medical decision making and plan of care.ROS completed by me personally and pertinent positives fully documented  I have made any additions or clarifications directly to the above note. Agree with note above. . Patient neurological condition remains poor with spontaneous extensor posturing increased with sternal rub with very poor exam with unreactive reactive pupils with preserved corneal reflex, gag reflex absent respiratory effort.  Family unable to make a decision despite extensive family meeting yesterday with multiple providers.  Family requested transfer to [facility] but no bed available when a call yesterday.  Family now wants a second opinion.  I did call [facility] again today and got the same reply that they do not have any transfer beds and they did not do any second opinion consults with patient being transferred there.  I spoke to the patient's father over the phone and  communicated to him the above and he stated he will discuss this with the rest of the family and get back to us about their decision whether to withdraw care or proceed with trach and PEG soon.  This patient is critically ill and at significant risk of neurological worsening, death and care requires constant monitoring of vital signs, hemodynamics,respiratory and cardiac monitoring, extensive review of multiple databases, frequent neurological assessment, discussion with family, other specialists and medical decision making of high complexity.I have made any additions or clarifications directly to the above note.This critical care time does not reflect procedure time, or teaching time or supervisory time of PA/NP/Med Resident etc but could involve care discussion time.  I spent 35 minutes of neurocritical care time  in the care of  this patient.     Delia HeadyPramod Eboney Claybrook, MD Medical Director [facility] Pager: [phone] 09/04/2020 3:00 PM   To contact Stroke Continuity provider, please refer to [email or web address]. After hours, contact General Neurology

## 2020-09-04 NOTE — Progress Notes (Signed)
Patient's father, Charlene [name], at bedside.  I spoke with him about granddaughter's (pt's daughter) request to have [facility] evaluate records to accept pt to transfer.  Charlene [name] said that the whole family is in agreement that the patient is not to transfer but because the patient's daughter has asked, they are ok with getting someone at [facility] to review the patient's records and give a 2nd opinion. I did bring to his attention that they have heard the opinion of 3 physicians in the family meeting yesterday already - Dr [name], Dr [name], and Dr Pearlean Brownie. I asked management and was told the family needs to find a physician at [facility] willing to review the patient's records, give [name] the name and number to contact, and we will take it from there. Charlene [name] C 12:50 PM
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
## 2020-09-04 NOTE — Progress Notes (Signed)
PT Cancellation Note and Discharge  Patient Details Name: Charlene [name] MRN: [id number] DOB: 03-17-1966   Cancelled Treatment:    Reason Eval/Treat Not Completed: Patient's level of consciousness. Pt obtunded and unable to participate. No therapeutic benefit indicated from further PT intervention. PT signing off.   Ilda Foil 09/04/2020, 12:14 PM  Aida Raider, PT  Office # [phone] Pager [phone]

## 2020-09-04 NOTE — Progress Notes (Signed)
Occupational Therapy Discharge Patient Details Name: Charlene [name] MRN: [id number] DOB: 08/01/1965 Today's Date: 09/04/2020 Time:  -     Patient discharged from OT services secondary to level of consciousness to participate. Recommendation for air mattress and BIL pravalon boots (pillow boots) for skin integrity. Pt with high risk for wounds due to decreased mobility and posturing. Pt is not a candidate at this time for UE splint management due to extensor tone and risk for skin break down if applied. Wash cloth could be used in hands to protect palms from finger nails.  Please see latest therapy progress note for current level of functioning and progress toward goals.    Progress and discharge plan discussed with patient and/or caregiver: Patient unable to participate in discharge planning and no caregivers available  GO     [name], OTR/L  Acute Rehabilitation Services Pager: [phone] Office: [phone] .  09/04/2020, 11:13 AM

## 2020-09-05 ENCOUNTER — Inpatient Hospital Stay (HOSPITAL_COMMUNITY): Payer: Medicaid Other

## 2020-09-05 DIAGNOSIS — R509 Fever, unspecified: Secondary | ICD-10-CM

## 2020-09-05 LAB — BASIC METABOLIC PANEL
Anion gap: 14 (ref 5–15)
BUN: 33 mg/dL — ABNORMAL HIGH (ref 6–20)
CO2: 31 mmol/L (ref 22–32)
Calcium: 9 mg/dL (ref 8.9–10.3)
Chloride: 100 mmol/L (ref 98–111)
Creatinine, Ser: 0.93 mg/dL (ref 0.44–1.00)
GFR, Estimated: >60 mL/min (ref >60)
Glucose, Bld: 152 mg/dL — ABNORMAL HIGH (ref 70–99)
Potassium: 3.5 mmol/L (ref 3.5–5.1)
Sodium: 145 mmol/L (ref 135–145)

## 2020-09-05 LAB — CBC
HCT: 22.1 % — ABNORMAL LOW (ref 36.0–46.0)
Hemoglobin: 7.1 g/dL — ABNORMAL LOW (ref 12.0–15.0)
MCH: 24.8 pg — ABNORMAL LOW (ref 26.0–34.0)
MCHC: 32.1 g/dL (ref 30.0–36.0)
MCV: 77.3 fL — ABNORMAL LOW (ref 80.0–100.0)
Platelets: 280 10*3/uL (ref 150–400)
RBC: 2.86 MIL/uL — ABNORMAL LOW (ref 3.87–5.11)
RDW: 15.4 % (ref 11.5–15.5)
WBC: 12.1 10*3/uL — ABNORMAL HIGH (ref 4.0–10.5)
nRBC: 0 % (ref 0.0–0.2)

## 2020-09-05 LAB — URINALYSIS, COMPLETE (UACMP) WITH MICROSCOPIC
Bilirubin Urine: NEGATIVE
Glucose, UA: NEGATIVE mg/dL
Hgb urine dipstick: NEGATIVE
Ketones, ur: NEGATIVE mg/dL
Leukocytes,Ua: NEGATIVE
Nitrite: NEGATIVE
Protein, ur: 30 mg/dL — AB
Specific Gravity, Urine: 1.018 (ref 1.005–1.030)
pH: 7 (ref 5.0–8.0)

## 2020-09-05 LAB — GLUCOSE, CAPILLARY
Glucose-Capillary: 133 mg/dL — ABNORMAL HIGH (ref 70–99)
Glucose-Capillary: 110 mg/dL — ABNORMAL HIGH (ref 70–99)
Glucose-Capillary: 193 mg/dL — ABNORMAL HIGH (ref 70–99)
Glucose-Capillary: 124 mg/dL — ABNORMAL HIGH (ref 70–99)
Glucose-Capillary: 188 mg/dL — ABNORMAL HIGH (ref 70–99)
Glucose-Capillary: 163 mg/dL — ABNORMAL HIGH (ref 70–99)

## 2020-09-05 IMAGING — MR MR HEAD W/O CM
12 of 13 series · 43 of 48 positions shown · non-contrast
Comparison: Head CT [DATE] and MRI [DATE]

CLINICAL DATA: Stroke follow-up.

EXAM:
MRI HEAD WITHOUT CONTRAST
TECHNIQUE: Multiplanar, multiecho pulse sequences of the brain and surrounding
structures were obtained without intravenous contrast.

[Series 5: DWI · axial · 3.0mm · 0.88mm/px · z∈[-108,+19]mm · 6 of 96 slices shown (1 of 4)]
[im 1/96]
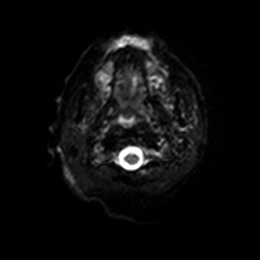
[im 20/96]
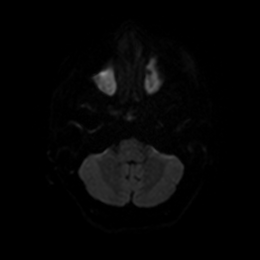
[im 39/96]
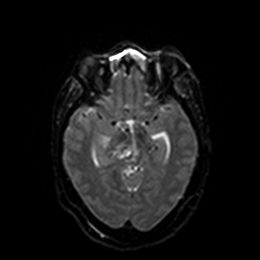
[im 58/96]
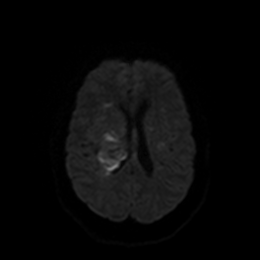
[im 77/96]
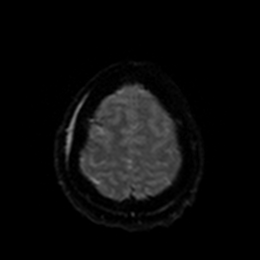
[im 96/96]
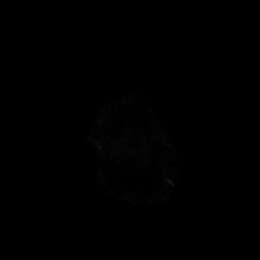

[Series 6: DWI · axial · 3.0mm · 0.88mm/px · z∈[-108,+19]mm · 3 of 48 slices shown (2 of 4)]
[im 1/48]
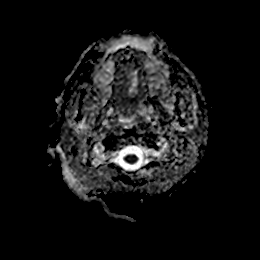
[im 24/48]
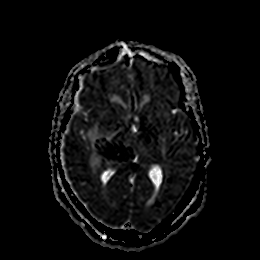
[im 48/48]
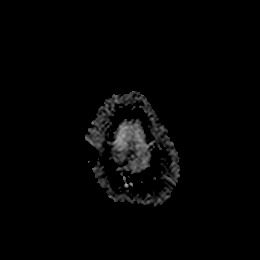

[Series 7: DWI · coronal · 4.0mm · 0.88mm/px · 5 of 64 slices shown (3 of 4)]
[im 1/64]
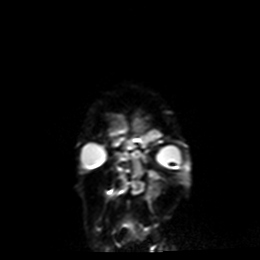
[im 16/64]
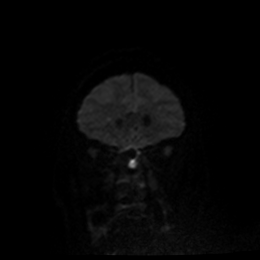
[im 32/64]
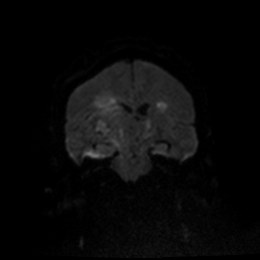
[im 48/64]
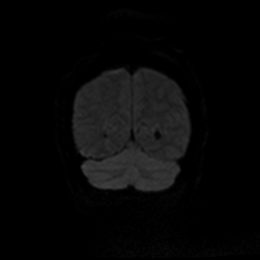
[im 64/64]
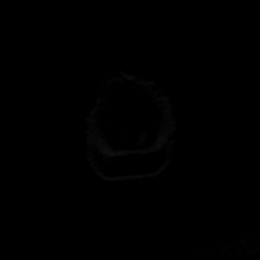

[Series 8: DWI · coronal · 4.0mm · 0.88mm/px · 2 of 32 slices shown (4 of 4)]
[im 1/32]
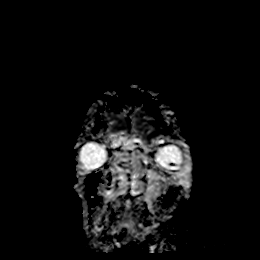
[im 32/32]
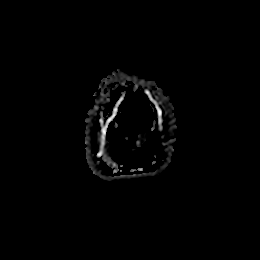

[Series 9: mag_images · axial · 3.0mm · 0.90mm/px · z∈[-128,+32]mm · 5 of 60 slices shown]
[im 1/60]
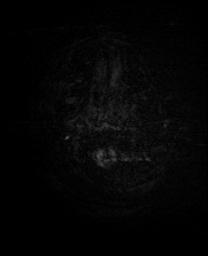
[im 15/60]
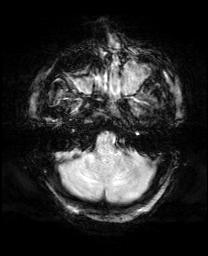
[im 30/60]
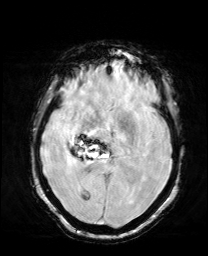
[im 45/60]
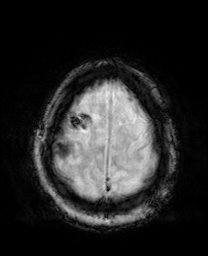
[im 60/60]
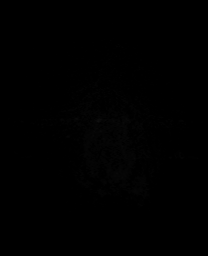

[Series 10: pha_images · axial · 3.0mm · 0.90mm/px · z∈[-128,+32]mm · 5 of 60 slices shown]
[im 1/60]
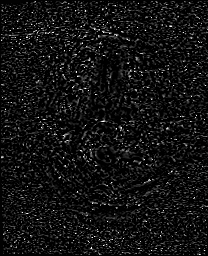
[im 15/60]
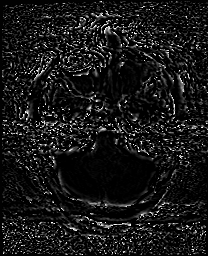
[im 30/60]
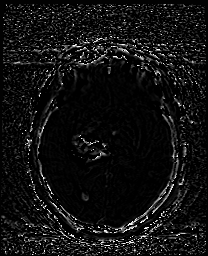
[im 45/60]
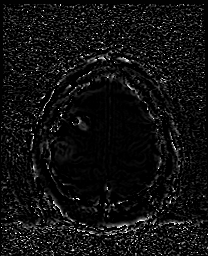
[im 60/60]
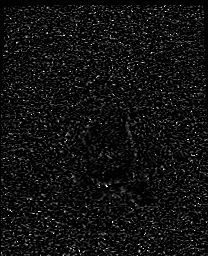

[Series 11: swi_images · axial · 3.0mm · 0.90mm/px · z∈[-128,+32]mm · 5 of 60 slices shown]
[im 1/60]
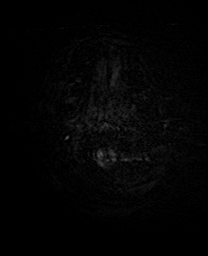
[im 15/60]
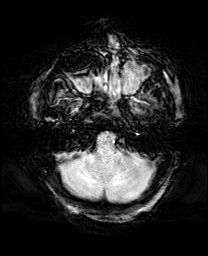
[im 30/60]
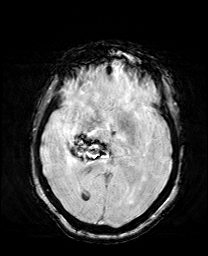
[im 45/60]
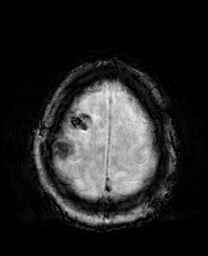
[im 60/60]
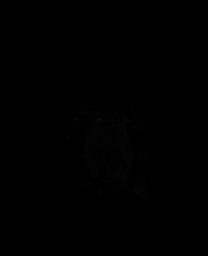

[Series 12: mip_images(sw) · axial · 24.0mm · 0.90mm/px · z∈[-118,+22]mm · 4 of 53 slices shown]
[im 1/53]
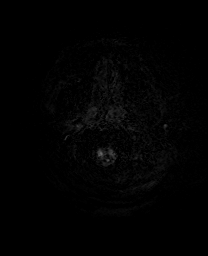
[im 18/53]
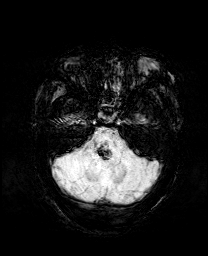
[im 35/53]
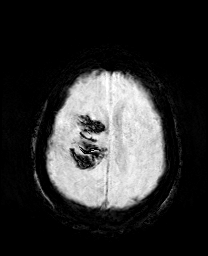
[im 53/53]
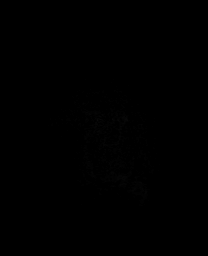

[Series 13: FLAIR · axial · 5.0mm · 0.45mm/px · z∈[-113,+17]mm · 2 of 25 slices shown]
[im 1/25]
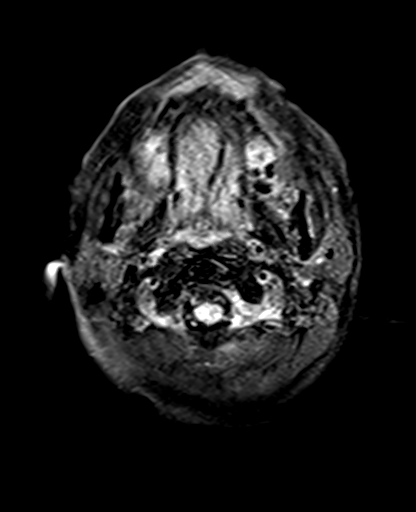
[im 25/25]
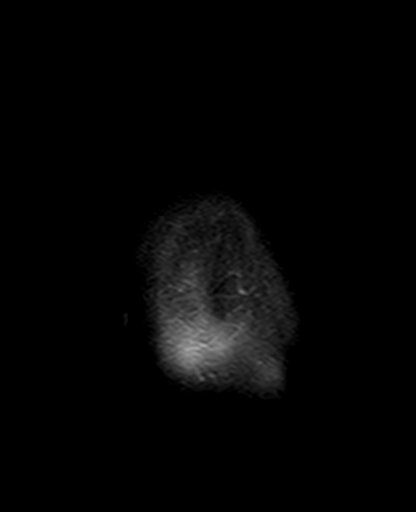

[Series 14: T2 · axial · 5.0mm · 0.72mm/px · z∈[-109,+21]mm · 2 of 25 slices shown (1 of 2)]
[im 1/25]
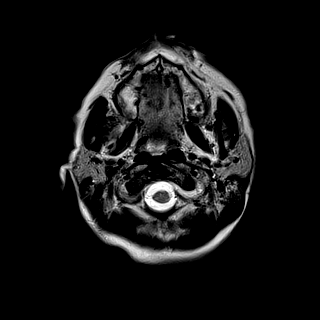
[im 25/25]
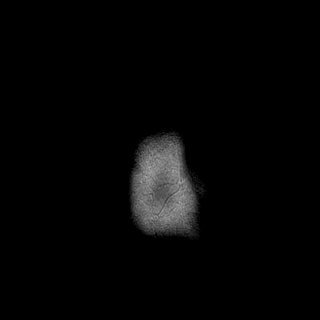

[Series 15: T1 · sagittal · 5.0mm · 0.75mm/px · 2 of 23 slices shown]
[im 1/23]
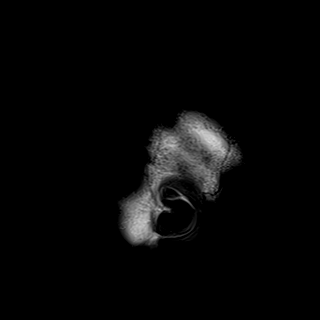
[im 23/23]
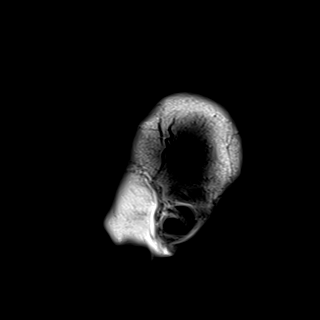

[Series 17: T2 · coronal · 5.0mm · 0.34mm/px · 2 of 29 slices shown (2 of 2)]
[im 1/29]
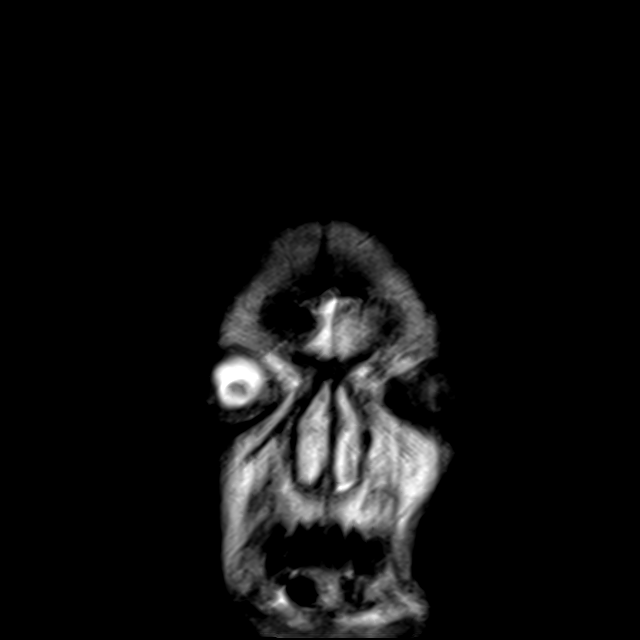
[im 29/29]
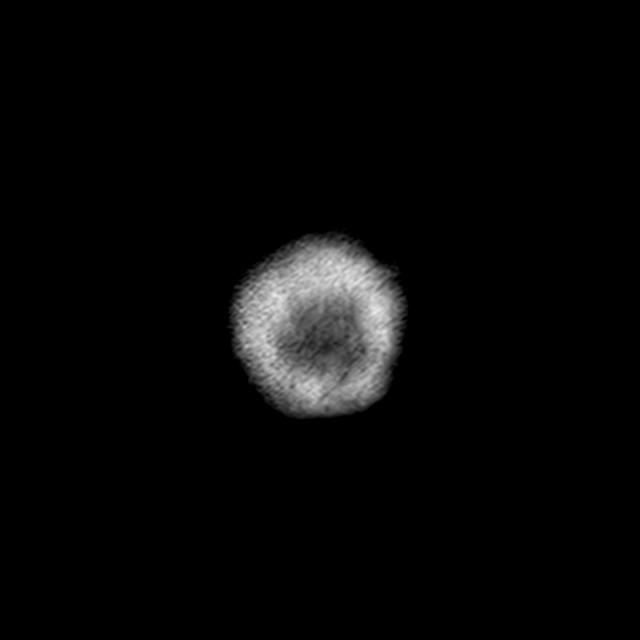

[43 of 48 positions shown; findings below may reference images not displayed]

FINDINGS: Brain: Compared to the prior MRI, there are new patchy acute to
early subacute infarcts in the white matter of the centrum semiovale
and corona radiata bilaterally, right slightly greater than left.
Additionally, there are new punctate acute infarcts in the white
matter adjacent to the atrium and occipital horn of the left lateral
ventricle and atrium of the right lateral ventricle as well as in
the right cerebellum. Restricted diffusion associated with infarcts
in the brainstem, bilateral temporal lobes, and bilateral basal
ganglia on the prior MRI has decreased. Restricted diffusion in the
splenium of the corpus callosum is less intense than on the prior
MRI though has a greater right to left extent now.

A large hemorrhage centered in the right thalamus with involvement
of the corona radiata and midbrain has not significantly changed in
size from the prior MRI. Blood products demonstrated expected
interval evolution, now subacute in appearance. A small separate
hemorrhage is again noted in the right lentiform nucleus separate
from the dominant right thalamic hemorrhage. There is increased,
small volume hemorrhage along the course of the right frontal
approach external ventricular drain which has since been removed.
The lateral ventricles are smaller than on the prior MRI but similar
in size to the more recent CT. Intraventricular hemorrhage has
decreased.

There is persistent mild edema associated with the right thalamic
hemorrhage. 6 mm of leftward midline shift is unchanged. There is no
extra-axial fluid collection. There is no age advanced cerebral
atrophy.

Vascular: Major intracranial vascular flow voids are preserved.

Skull and upper cervical spine: No suspicious marrow lesion.

Sinuses/Orbits: Unremarkable orbits. Extensive diffuse paranasal
sinus opacification and moderate mastoid effusions in the setting of
intubation.

Other: None.
IMPRESSION: 1. Acute to early subacute infarcts in the bilateral cerebral white
matter and right cerebellum, new from [DATE].
2. Interval evolution of multiple other infarcts present on the
prior MRI.
3. Unchanged size of large hemorrhage centered in the right thalamus
with extension into the midbrain. Unchanged 6 mm of leftward midline
shift.
4. Decreased intraventricular hemorrhage.
5. Increased hemorrhage along the old right EVD tract. Unchanged
ventricle size.

## 2020-09-05 IMAGING — DX DG CHEST 1V PORT
1 series · 1 of 1 positions shown · non-contrast
Comparison: [DATE]

CLINICAL DATA: Fevers.

EXAM:
PORTABLE CHEST 1 VIEW

[chest ap]
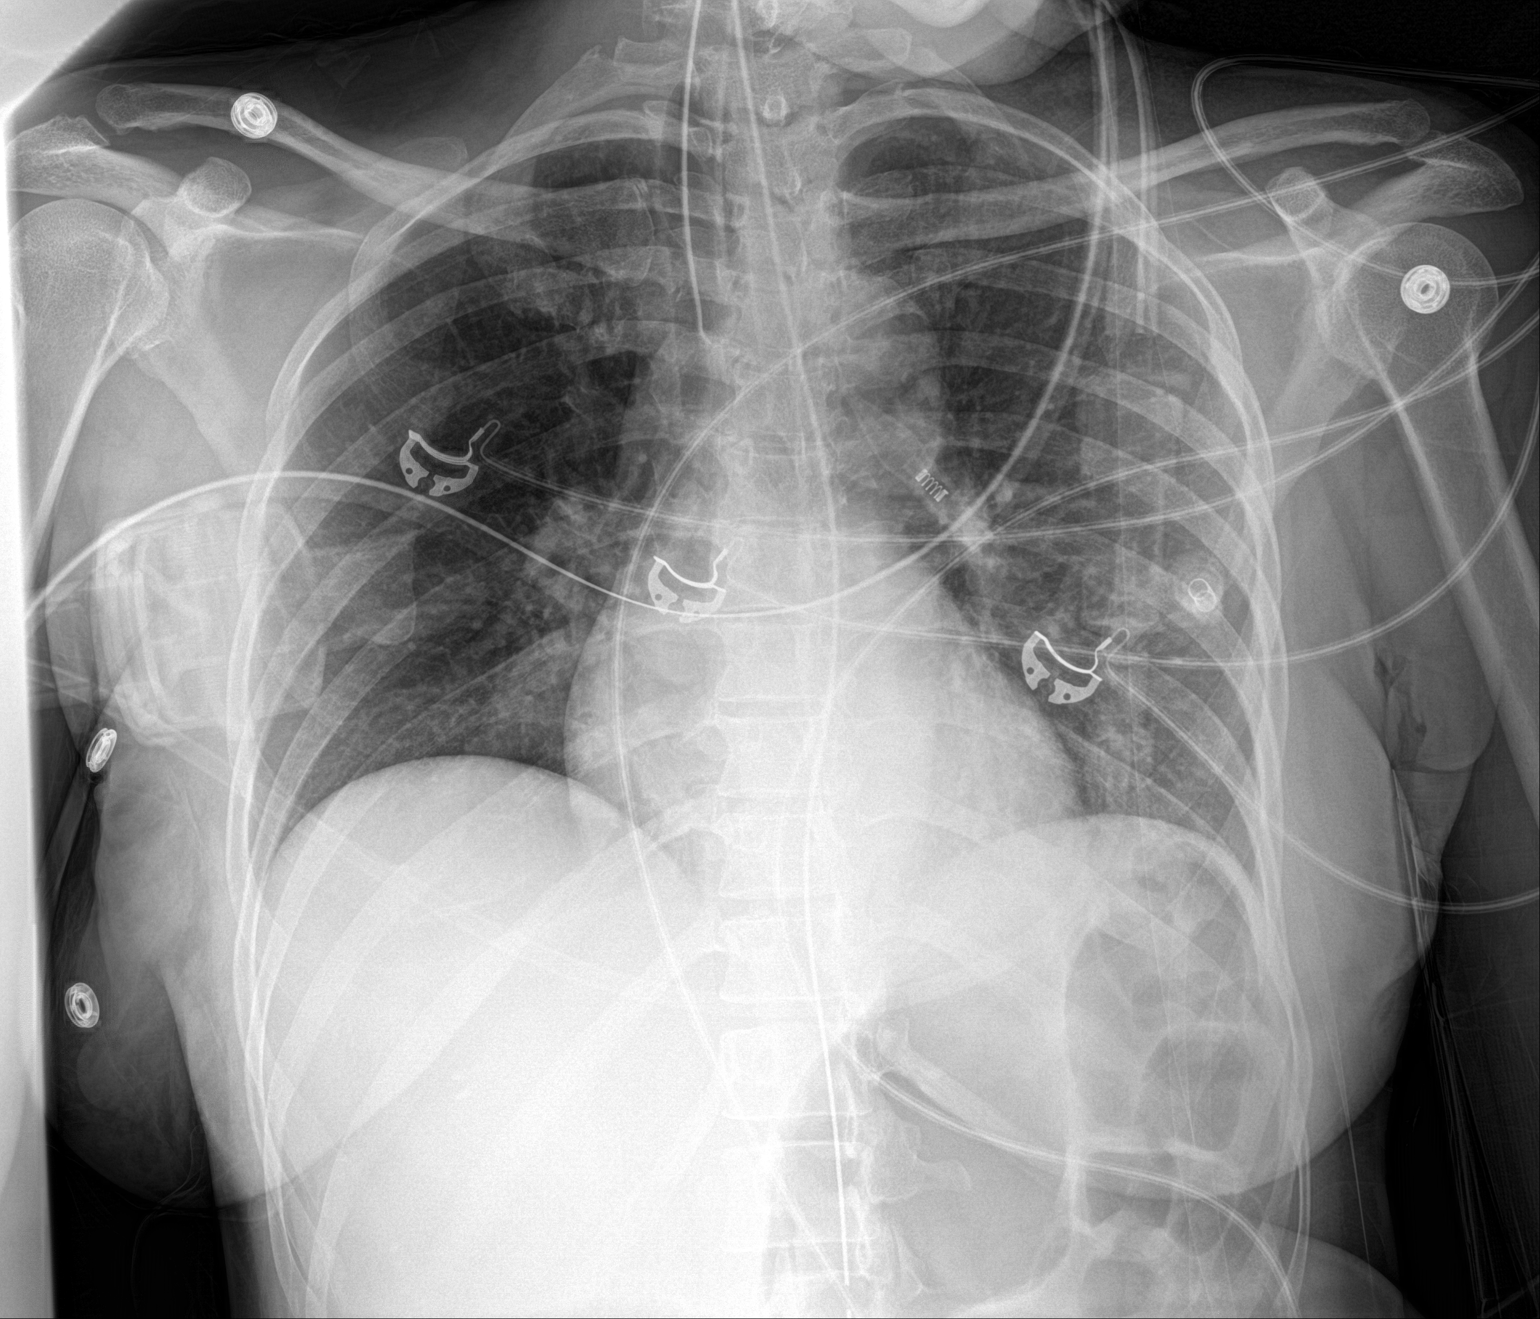

[1 of 1 positions shown; findings below may reference images not displayed]

FINDINGS: ETT tip is stable above the carina. NG tube tip is below the GE
junction. Stable cardiomediastinal contours. No pleural effusion or
edema. No airspace densities.
IMPRESSION: 1. Stable support apparatus.
2. Lungs clear.

## 2020-09-05 MED ORDER — CARVEDILOL 12.5 MG PO TABS
12.5000 mg | ORAL_TABLET | Freq: Two times a day (BID) | ORAL | Status: DC
  Administered 2020-09-05 – 2020-09-13 (×16): 12.5 mg
  Filled 2020-09-05 (×17): qty 1

## 2020-09-05 MED ORDER — CLEVIDIPINE BUTYRATE 0.5 MG/ML IV EMUL
0.0000 mg/h | INTRAVENOUS | Status: DC
  Administered 2020-09-05: 2 mg/h via INTRAVENOUS
  Filled 2020-09-05 (×3): qty 50

## 2020-09-05 MED ORDER — CLEVIDIPINE BUTYRATE 0.5 MG/ML IV EMUL
INTRAVENOUS | Status: AC
  Administered 2020-09-05: 4 mg/h via INTRAVENOUS
  Filled 2020-09-05: qty 50

## 2020-09-05 MED ORDER — CEFAZOLIN SODIUM-DEXTROSE 1-4 GM/50ML-% IV SOLN
1.0000 g | Freq: Three times a day (TID) | INTRAVENOUS | Status: DC
  Administered 2020-09-05 – 2020-09-13 (×24): 1 g via INTRAVENOUS
  Filled 2020-09-05 (×24): qty 50

## 2020-09-05 NOTE — Progress Notes (Signed)
Patient hypertensive, not responding to multiple pushes of labetolol. Dr. Roda Shutters aware, Cleviprex started.
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
## 2020-09-05 NOTE — Progress Notes (Signed)
Pt transported from 4N30 to MRI and back to room 4N30, via Servo I, on 100% FIO2 , PRVC, VT 450, RR 14, 100%, 5 peep, ETT secure @ 23 cm lip, 7.5, 2 full tanks. Time: 1345 to 1440. During the MRI, pt was placed on the MRI vent @ 100%, same settings.

## 2020-09-05 NOTE — Progress Notes (Signed)
STROKE TEAM PROGRESS NOTE   INTERVAL HISTORY Father and Dr. Denese Killings at the bedside. Pt still intubated on vent. However, pt continues to have fever, tachycardia and BP was high. Had to put back on cleviprex. Discussed with father at bedside, family more leaning towards trach and PEG. But will repeat MRI to evaluate the extent of brain damage and will discuss about family again.   Vitals:   09/05/20 1000 09/05/20 1100 09/05/20 1130 09/05/20 1136  BP: 117/70 (!) 217/141 (!) 206/137 (!) 200/132  Pulse: 96 (!) 114 (!) 109 (!) 108  Resp: 16 (!) 27 (!) 27 (!) 30  Temp:      TempSrc:      SpO2: 100% 100% 100% 100%  Weight:      Height:       CBC:  Recent Labs  Lab 09/03/20 0944 09/05/20 0457  WBC 11.4* 12.1*  NEUTROABS 9.0*  --   HGB 7.4* 7.1*  HCT 22.7* 22.1*  MCV 76.4* 77.3*  PLT 207 280   Basic Metabolic Panel:  Recent Labs  Lab 08/29/20 1617 08/29/20 2114 08/31/20 0411 08/31/20 0941 09/01/20 0347 09/01/20 1014 09/03/20 0944 09/03/20 1616 09/04/20 0959 09/05/20 0457  NA 155*   < > 154*   < > 151*   < > 147*   < > 146* 145  K  --    < > 3.6  --  3.8  --  3.4*  --   --  3.5  CL  --    < > 120*  --  118*  --  108  --   --  100  CO2  --    < > 25  --  19*  --  28  --   --  31  GLUCOSE  --    < > 130*  --  178*  --  170*  --   --  152*  BUN  --    < > 24*  --  26*  --  34*  --   --  33*  CREATININE  --    < > 1.13*  --  1.04*  --  1.07*  --   --  0.93  CALCIUM  --    < > 9.1  --  8.6*  --  8.7*  --   --  9.0  MG 1.9  --  2.3  --   --   --   --   --   --   --   PHOS 3.2  --  2.4*  --  4.3  --   --   --   --   --    < > = values in this interval not displayed.   Lipid Panel:  Recent Labs  Lab 09/03/20 0353  TRIG 119   HgbA1c:  No results for input(s): HGBA1C in the last 168 hours. Urine Drug Screen:  No results for input(s): LABOPIA, COCAINSCRNUR, LABBENZ, AMPHETMU, THCU, LABBARB in the last 168 hours.  Alcohol Level  No results for input(s): ETH in the last 168  hours.  IMAGING past 24 hours DG CHEST PORT 1 VIEW  Result Date: 09/05/2020 CLINICAL DATA:  Fevers. EXAM: PORTABLE CHEST 1 VIEW COMPARISON:  09/01/2020 FINDINGS: ETT tip is stable above the carina. NG tube tip is below the GE junction. Stable cardiomediastinal contours. No pleural effusion or edema. No airspace densities. IMPRESSION: 1. Stable support apparatus. 2. Lungs clear. Electronically Signed   By: Veronda Prude.D.  On: 09/05/2020 07:46    PHYSICAL EXAM  Temp:  [98.2 F (36.8 C)-101.1 F (38.4 C)] 100.3 F (37.9 C) (02/19 0800) Pulse Rate:  [87-114] 108 (02/19 1136) Resp:  [15-30] 30 (02/19 1136) BP: (95-217)/(54-147) 200/132 (02/19 1136) SpO2:  [99 %-100 %] 100 % (02/19 1136) FiO2 (%):  [30 %] 30 % (02/19 1136) Weight:  [63.4 kg] 63.4 kg (02/19 0500)  General - Well nourished, well developed, intubated off sedation.  Ophthalmologic - fundi not visualized due to noncooperation.  Cardiovascular - Regular rhythm and tachycardia.  Neuro - intubated off sedation, eyes closed, not following commands. With forced eye opening, left eye in downward position, right eye mid position, left pupil 1.54mm, right 9mm, not reactive to light, doll's eyes absent, not tracking. Corneal reflex weakly present bilaterally, gag and cough present. Breathing over the vent.  Facial symmetry not able to test due to ET tube.  Tongue protrusion not cooperative. On pain stimulation, BUE extension posturing, BLE mild withdraw. Bilateral positive babinski. Sensation, coordination and gait not tested.   ASSESSMENT/PLAN  Charlene [name] is a 55 y.o. female with history of hypertension with difficulty maintaining blood pressures and noncompliance to medications for various reasons, presented to the emergency room at [facility] for left-sided weakness and headaches.  She was appear to be confused and lethargic. She was seen by telemedicine evaluation by neurologist for concern for stroke  and a noncontrast head CT showed a right thalamic/basal ganglia hemorrhage 3.2 x 2.1 x 2.5  with intraventricular extension.  He was transferred ED to ED for inpatient neurological evaluation and admission. BP high at  178/109 mmHg on Admission. CTA head & neck showed No intracranial large vessel occlusion or proximal high-grade arterial stenosis. 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 Repeat CT Head on 08/27/20 showed- Stable parenchymal hemorrhage in the right thalamus and basal ganglia with intraventricular extension. ECHO LVEF 60-65%. mild concentric left ventricular hypertrophy. HgbA1c 6.0. LDL 167. Repeat CT head (08/27/20 )showed Stable parenchymal hemorrhage in the right thalamus and basal ganglia with intraventricular extension. S/P ventricular catheter placement on 08/27/2020. Following that Pt had labored breathing so intubated. CT head repeat post procedure didn't demonstrate any increase in size of hemorrhage.  3% normal saline was stopped. PT recommends SNF. MRA normal, MRI showed scattered b/l hippocampus, posterior CC, bilateral thalamus, and upper pons. Pt is on Vanco and Cefipime due to fever but stopped after negative cultures. Cultures negative until now. Goals of care discussed with family by Attending  and palliative care. Family meeting held on 09/03/20. Palliative care and CCM on board.   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   CT Head -  Right thalamic ICH with IVH in the third and fourth ventricles and nearly filling the right lateral ventricle. No hydrocephalus.   CTA head & neck - No intracranial large vessel occlusion or proximal high-grade arterial stenosis. 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   Repeat CT Head x 3 - Stable parenchymal hemorrhage in the right thalamus and basal ganglia with intraventricular extension as described.    2D ECHO LVEF 60-65%. mild concentric left ventricular hypertrophy  LDL 167  HgbA1c 6.0  Vitamin B12 301  VTE prophylaxis -  Lovenox  No antithrombotic prior to admission, now on No antithrombotic due to ICH.   Therapy recommendations: SNF  Disposition: TBD - father  leaning towards trach and PEG with understanding that the prognosis will be poor and pt would likely not gain any functional outcome  Slight obstructive hydrocephalus  CT Head -  IVH in the third and fourth ventricles and nearly filling the right lateral ventricle.  Ventricle size slightly larger than her baseline CT in 04/2020  Repeat CT head x 3 stable ventricle size.   NSG on board  S/P ventricular catheter placement on 08/27/2020. Neurosurgery clamped EVD 08/29/20 and dc on 08/30/20  Minimal drainage  Intracranial pressure not high  Stroke - scattered b/l hippocampus, posterior CC, bilateral thalamus, and upper pons - likely due to deep penetrating vessel compression by focal mass-effect  MRI brain 2/11 right greater than left mesial temporal lobes / parahippocampal cortex, bilateral basal ganglia, and possibly the upper pons that are concerning for acute infarcts, most likely secondary to mass effect from the hemorrhage.  MRA Normal  EEG No seizures or epileptiform discharges  Off 3% saline   MRI repeat pending  Hypertensive emergency hypotension  Home meds: Coreg 6.25 mg and Hydrochlorothiazide 25 mg daily  Unstable  . BP goal SP <160 mmHG for now . On Claviprex PRN . Long-term BP goal normotensive . Continue Coreg 12.5 and Hydrochlorothiazide 25 mg Daily .  Hyperlipidemia  Home meds: None   LDL 167, goal < 70  Hold starting statin due to acute ICH  Consider statin at discharge  AKI  Cre 1.09 -> 1.94>1.09>1.04>1.07->0.93  Continue IV fluid  Foley placed on 08/28/20  BMP monitoring  Anemia   Hb 7.4->7.1  CCM aware  CBC monitoring  Monitor and transfuse per  protocol.  Fever, could be central fever  Vancomycin and Cefipime stopped.  Temp 101.1->100.3->100.6  WBC 13.0 >10.1>11.4->12.1  Cultures Negative  UA neg  CXR neg  Other Stroke Risk Factors  Noncompliance   Hospital day #9  This patient is critically ill due to ICH, hydrocephalus, stroke, hypertensive emergency, anemia, fever and at significant risk of neurological worsening, death form sepsis, brain herniation, cerebral edema, shock, brain death. This patient's care requires constant monitoring of vital signs, hemodynamics, respiratory and cardiac monitoring, review of multiple databases, neurological assessment, discussion with family, other specialists and medical decision making of high complexity. I spent 45 minutes of neurocritical care time in the care of this patient. I had long discussion with dad at bedside, updated pt current condition, treatment plan and potential prognosis, and answered all the questions. He expressed understanding and appreciation. I also discussed with Dr. Denese Killings.  Marvel Plan, MD PhD Stroke Neurology 09/05/2020 9:39 PM   To contact Stroke Continuity provider, please refer to [email or web address]. After hours, contact General Neurology

## 2020-09-06 LAB — BLOOD CULTURE ID PANEL (REFLEXED) - BCID2

## 2020-09-06 LAB — GLUCOSE, CAPILLARY
Glucose-Capillary: 165 mg/dL — ABNORMAL HIGH (ref 70–99)
Glucose-Capillary: 112 mg/dL — ABNORMAL HIGH (ref 70–99)
Glucose-Capillary: 156 mg/dL — ABNORMAL HIGH (ref 70–99)
Glucose-Capillary: 108 mg/dL — ABNORMAL HIGH (ref 70–99)
Glucose-Capillary: 182 mg/dL — ABNORMAL HIGH (ref 70–99)

## 2020-09-06 LAB — PREPARE RBC (CROSSMATCH)

## 2020-09-06 MED ORDER — SODIUM CHLORIDE 0.9% IV SOLUTION: Freq: Once | INTRAVENOUS | Status: AC

## 2020-09-06 MED ORDER — VANCOMYCIN HCL 1250 MG/250ML IV SOLN
1250.0000 mg | Freq: Once | INTRAVENOUS | Status: AC
  Administered 2020-09-06: 1250 mg via INTRAVENOUS
  Filled 2020-09-06: qty 250

## 2020-09-06 MED ORDER — AMLODIPINE BESYLATE 5 MG PO TABS
5.0000 mg | ORAL_TABLET | Freq: Every day | ORAL | Status: DC
  Administered 2020-09-06 – 2020-09-09 (×4): 5 mg
  Filled 2020-09-06 (×4): qty 1

## 2020-09-06 MED ORDER — BUSPIRONE HCL 10 MG PO TABS
10.0000 mg | ORAL_TABLET | Freq: Three times a day (TID) | ORAL | Status: DC
  Administered 2020-09-06 – 2020-09-11 (×16): 10 mg
  Filled 2020-09-06 (×16): qty 1

## 2020-09-06 NOTE — Progress Notes (Signed)
RN called RT to assess patient due to ventilator alarming  "No patient Effort". This ventilator alarm has went off multiple times since 1900. Pt is not initiating breaths to sustain on PSV. RT placed patient on full support PRVC for the night. RT will continue to monitor patient.

## 2020-09-06 NOTE — Progress Notes (Signed)
NAME:  Charlene [name], MRN:  [id number], DOB:  1965-12-29, LOS: 10 ADMISSION DATE:  08/26/2020, CONSULTATION DATE:  2/10 REFERRING MD:  Dr. Roda Shutters, CHIEF COMPLAINT:  ICH   Brief History:  55 year old female admitted with hypertensive R thalamic/basal ganglia ICH. Resultant obstructive hydrocephalus prompting EVD placement 2/10. Subsequently, 2/10 she had acute mental status change to obtundation and respiratory arrest resulting in intubation.  Past Medical History:  HTN, medication noncompliance  Significant Hospital Events:  2/09 Admit for ICH 2/10 EVD placed, later intubated due to obtundation and respiratory arrest 2/11 MRI Brain, hypertonic saline d/c'd, EVD not working.  Husband did not consent to PICC  2/13 EVD removed 2/17 GOC discussion with family, PMT  Consults:  Stroke PCCM Neurosurgery  Procedures:  ETT 2/10 >> EVD 2/10 >> non functioning 2/11 at 1500; clamped 2/12 am >> 2/13  Significant Diagnostic Tests:   CT Head 2/9 >> Acute hemorrhage with the epicenter in the right thalamus. Measurement is approximately 3.2 x 2.1 x 2.5 cm (volume 8.8 cm^3). Intraventricular penetration with blood filling the third and fourth ventricles and nearly filling the right lateral ventricle. No hydrocephalus.  CTA Head/Neck 2/10 >> Unchanged size of intraparenchymal hemorrhage centered in the right thalamus and basal ganglia with extension into the right lateral ventricle. 2. No intracranial arterial occlusion or high-grade stenosis. No dissection, aneurysm or hemodynamically significant stenosis of the carotid or vertebral arteries.  CT Head 2/10 >> Stable parenchymal hemorrhage in the right thalamus and basal ganglia with intraventricular extension as described. No new focal abnormality is noted.  CT Head 2/10 (post intubation) >> redemonstrated intraparenchymal hemorrhage centered within the R thalamus/basal ganglia extending into the cerebral peduncle/midbrain with associated  intraventricular extension and hemorrhage in the R lateral ventricle and fourth ventricle, overall not significantly changed from prior; stable mass effect, 80mm of R to L midline shift, postsurgical changes of ventriculostomy  TTE 2/10 >> LVEF 60 to 65%, LV has normal function, no regional wall motion abnormalities, mild concentric LVH.  RV systolic function is normal, normal pulmonary artery systolic pressure.   MRA head 2/11 >>Negative intracranial MR angiography. No evidence of aneurysm or high flow vascular malformation to explain the intraparenchymal hemorrhage   MRI brain 2/11 >> Similar intraparenchymal hemorrhage centered within the right thalamus and basal ganglia extending into the right midbrain with intraventricular extension, effacement of the prepontine cistern and similar 4 mm of leftward midline shift; separate areas of restricted diffusion and edema involving the R > L mesial temporal lobes/parahippocampal cortex, bilateral basal ganglia, and possibly the upper pons that are c/f acute infarcts, most likely secondary to mass effect from hemorrhage.  MRI brain 2/19 >> intraventricular clot has decreased in size but mid-brain parenchymal clot has not and there are more ischemic infarcts in the surrounding brain.   Micro Data:  2/9 COVID + Influenza negative 2/10 MRSA PCR negative 2/12 Tracheal aspirate >> 2/12 BCx 2 >>  Antimicrobials:  Cefepime 2/12 >> 2/15 Vanc 2/12 >> 2/15  Interim History / Subjective:  No change in status.  MRI shows worsening ischemic infarcts.   Objective   Blood pressure 137/85, pulse (!) 103, temperature 98.9 F (37.2 C), temperature source Oral, resp. rate 17, height 5\' 5"  (1.651 m), weight 65.5 kg, last menstrual period 03/03/2014, SpO2 100 %, unknown if currently breastfeeding.    Vent Mode: PRVC FiO2 (%):  [30 %] 30 % Set Rate:  [14 bmp] 14 bmp Vt Set:  [450 mL] 450 mL PEEP:  [  5 cmH20] 5 cmH20 Plateau Pressure:  [12 cmH20-17 cmH20] 17 cmH20    Intake/Output Summary (Last 24 hours) at 09/06/2020 0757 Last data filed at 09/06/2020 0518 Gross per 24 hour  Intake 1844.29 ml  Output 1200 ml  Net 644.29 ml   Filed Weights   09/04/20 0430 09/05/20 0500 09/06/20 0400  Weight: 62.6 kg 63.4 kg 65.5 kg   Examination: General: Critically ill adult female, intubated/unresponsive, NAD. HEENT: Anicteric sclera, R pupil remains fixed. L pupil reacts to light.  facial asymmetry/lip swelling. Moist mucous membranes, ETT in place. Copious purulent nasal drainage.   Neuro: Unresponsive to verbal stimuli, does flex minimally to noxious stimuli (suctioning, pain). CV: S1S2, extremities wd  PULM: Clear chest, tolerating PSV GI: Soft, mild distention, normoactive BS. Skin: Warm/dry, no rashes.  Resolved Hospital Problem list    Assessment & Plan:  Intraparenchymal hemorrhage with right thalamic hemorrhage with extension into the third fourth and right lateral ventricle Obstructive hydrocephalus Hypertensive emergency Acute hypoxemic respiratory failure secondary to CVA Hypernatremia Central fever  Possible bacterial sinusitis Anemia   Plan:  -Transition to pressure support ventilation - MRI shows increased burden from ischemic strokes from which recovery is less certain.  - Continue current management pending tracheostomy and PEG decision.  Patient is probably close to new neurological baseline for the foreseeable future - Will transfuse today given slow drift downwards.  - Add buspirone for central fever  - Will increase enteral anti-hypertensive agents. - Empiric cefazolin for possible sinusitis.    Daily Goals Checklist  Pain/Anxiety/Delirium protocol (if indicated): None Neuro vitals: every 4 hours AED's: None VAP protocol (if indicated): Bundle in place Respiratory support goals: Switch to pressure support.  Leave on pressure support as tolerated. Blood pressure target: 120-160.  DVT prophylaxis: Enoxaparin Nutrition  Status: Tolerating tube feeds GI prophylaxis: Protonix Fluid status goals: Allow Urinary catheter: External catheter Central lines: PIV only Glucose control: Well-controlled on correction scale Mobility/therapy needs: Bedrest Antibiotic de-escalation: cefazolin for 10 days.  Home medication reconciliation: On hold Daily labs: CBC, BMP twice weekly Code Status: Full Family Communication: I updated the patient's father on 2/ 54  Disposition: ICU.  Will likely need tracheostomy PEG tube   Goals of Care:  Last date of multidisciplinary goals of care discussion: Per primary Family and staff present:  Summary of discussion:  Follow up goals of care discussion due: Per primary, Palliative care involved.  Patient has 4 adult children from prior marriage and two teenage children in current marriage.  Possibly some discord between the two.  Family meeting 2/17 Code Status: Full  Critical care time: 40 minutes  Lynnell Catalan, MD [facility] 09/06/20 7:57 AM  Please see [email or web address] for pager details.

## 2020-09-06 NOTE — Progress Notes (Signed)
STROKE TEAM PROGRESS NOTE   INTERVAL HISTORY No family is at the bedside. Pt still intubated on vent, posturing of BUEs and withdraw of BLEs on pain stimulation. No significant neuro changes. BP on the higher end, on cleveiprex and increase po BP meds. Dr. Denese Killings is going to update husband today.   Vitals:   09/06/20 1000 09/06/20 1100 09/06/20 1115 09/06/20 1136  BP: (!) 170/82 109/65 127/70   Pulse: (!) 108 96 (!) 106   Resp: (!) 23 20 (!) 23   Temp:  (!) 100.6 F (38.1 C) (!) 100.4 F (38 C)   TempSrc:  Axillary Axillary   SpO2: 100% 99% 99% 98%  Weight:      Height:       CBC:  Recent Labs  Lab 09/03/20 0944 09/05/20 0457  WBC 11.4* 12.1*  NEUTROABS 9.0*  --   HGB 7.4* 7.1*  HCT 22.7* 22.1*  MCV 76.4* 77.3*  PLT 207 280   Basic Metabolic Panel:  Recent Labs  Lab 08/31/20 0411 08/31/20 0941 09/01/20 0347 09/01/20 1014 09/03/20 0944 09/03/20 1616 09/04/20 0959 09/05/20 0457  NA 154*   < > 151*   < > 147*   < > 146* 145  K 3.6  --  3.8  --  3.4*  --   --  3.5  CL 120*  --  118*  --  108  --   --  100  CO2 25  --  19*  --  28  --   --  31  GLUCOSE 130*  --  178*  --  170*  --   --  152*  BUN 24*  --  26*  --  34*  --   --  33*  CREATININE 1.13*  --  1.04*  --  1.07*  --   --  0.93  CALCIUM 9.1  --  8.6*  --  8.7*  --   --  9.0  MG 2.3  --   --   --   --   --   --   --   PHOS 2.4*  --  4.3  --   --   --   --   --    < > = values in this interval not displayed.   Lipid Panel:  Recent Labs  Lab 09/03/20 0353  TRIG 119   HgbA1c:  No results for input(s): HGBA1C in the last 168 hours. Urine Drug Screen:  No results for input(s): LABOPIA, COCAINSCRNUR, LABBENZ, AMPHETMU, THCU, LABBARB in the last 168 hours.  Alcohol Level  No results for input(s): ETH in the last 168 hours.  IMAGING past 24 hours MR BRAIN WO CONTRAST  Result Date: 09/05/2020 CLINICAL DATA:  Stroke follow-up. EXAM: MRI HEAD WITHOUT CONTRAST TECHNIQUE: Multiplanar, multiecho pulse  sequences of the brain and surrounding structures were obtained without intravenous contrast. COMPARISON:  Head CT 08/30/2020 and MRI 08/28/2020 FINDINGS: Brain: Compared to the prior MRI, there are new patchy acute to early subacute infarcts in the white matter of the centrum semiovale and corona radiata bilaterally, right slightly greater than left. Additionally, there are new punctate acute infarcts in the white matter adjacent to the atrium and occipital horn of the left lateral ventricle and atrium of the right lateral ventricle as well as in the right cerebellum. Restricted diffusion associated with infarcts in the brainstem, bilateral temporal lobes, and bilateral basal ganglia on the prior MRI has decreased. Restricted diffusion in the splenium of the  corpus callosum is less intense than on the prior MRI though has a greater right to left extent now. A large hemorrhage centered in the right thalamus with involvement of the corona radiata and midbrain has not significantly changed in size from the prior MRI. Blood products demonstrated expected interval evolution, now subacute in appearance. A small separate hemorrhage is again noted in the right lentiform nucleus separate from the dominant right thalamic hemorrhage. There is increased, small volume hemorrhage along the course of the right frontal approach external ventricular drain which has since been removed. The lateral ventricles are smaller than on the prior MRI but similar in size to the more recent CT. Intraventricular hemorrhage has decreased. There is persistent mild edema associated with the right thalamic hemorrhage. 6 mm of leftward midline shift is unchanged. There is no extra-axial fluid collection. There is no age advanced cerebral atrophy. Vascular: Major intracranial vascular flow voids are preserved. Skull and upper cervical spine: No suspicious marrow lesion. Sinuses/Orbits: Unremarkable orbits. Extensive diffuse paranasal sinus  opacification and moderate mastoid effusions in the setting of intubation. Other: None. IMPRESSION: 1. Acute to early subacute infarcts in the bilateral cerebral white matter and right cerebellum, new from 08/28/2020. 2. Interval evolution of multiple other infarcts present on the prior MRI. 3. Unchanged size of large hemorrhage centered in the right thalamus with extension into the midbrain. Unchanged 6 mm of leftward midline shift. 4. Decreased intraventricular hemorrhage. 5. Increased hemorrhage along the old right EVD tract. Unchanged ventricle size. Electronically Signed   By: Sebastian AcheAllen  Grady M.D.   On: 09/05/2020 15:56    PHYSICAL EXAM  Temp:  [98.9 F (37.2 C)-100.8 F (38.2 C)] 100.4 F (38 C) (02/20 1115) Pulse Rate:  [95-117] 106 (02/20 1115) Resp:  [14-30] 23 (02/20 1115) BP: (97-192)/(60-131) 127/70 (02/20 1115) SpO2:  [97 %-100 %] 98 % (02/20 1136) FiO2 (%):  [30 %] 30 % (02/20 1136) Weight:  [65.5 kg] 65.5 kg (02/20 0400)  General - Well nourished, well developed, intubated off sedation.  Ophthalmologic - fundi not visualized due to noncooperation.  Cardiovascular - Regular rhythm and tachycardia.  Neuro - intubated off sedation, eyes closed, not following commands. With forced eye opening, left eye in downward position, right eye mid position, left pupil 1.305mm, right 2mm, not reactive to light, doll's eyes absent, not tracking. Corneal reflex weakly present bilaterally, gag and cough present. Breathing over the vent.  Facial symmetry not able to test due to ET tube.  Tongue protrusion not cooperative. On pain stimulation, BUE extension posturing, BLE mild withdraw. Bilateral positive babinski. Sensation, coordination and gait not tested.   ASSESSMENT/PLAN  [name] is a 55 y.o. female with history of hypertension with difficulty maintaining blood pressures and noncompliance to medications for various reasons, [facility] for left-sided weakness and headaches.  She was appear to be confused and lethargic. She was seen by telemedicine evaluation by neurologist for concern for stroke and a noncontrast head CT showed a right thalamic/basal ganglia hemorrhage 3.2 x 2.1 x 2.5  with intraventricular extension.  He was transferred ED to ED for inpatient neurological evaluation and admission. BP high at  178/109 mmHg on Admission. CTA head & neck showed No intracranial large vessel occlusion or proximal high-grade arterial stenosis. 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 Repeat CT Head on 08/27/20 showed- Stable parenchymal hemorrhage in the right thalamus and basal ganglia with intraventricular  extension. ECHO LVEF 60-65%. mild concentric left ventricular hypertrophy. HgbA1c 6.0. LDL 167. Repeat CT head (08/27/20 )showed Stable parenchymal hemorrhage in the right thalamus and basal ganglia with intraventricular extension. S/P ventricular catheter placement on 08/27/2020. Following that Pt had labored breathing so intubated. CT head repeat post procedure didn't demonstrate any increase in size of hemorrhage.  3% normal saline was stopped. PT recommends SNF. MRA normal, MRI showed scattered b/l hippocampus, posterior CC, bilateral thalamus, and upper pons. Pt is on Vanco and Cefipime due to fever but stopped after negative cultures. Cultures negative until now. Goals of care discussed with family by Attending  and palliative care. Family meeting held on 09/03/20. Palliative care and CCM on board.   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   CT Head -  Right thalamic ICH with IVH in the third and fourth ventricles and nearly filling the right lateral ventricle. No hydrocephalus.   CTA head & neck - No intracranial large vessel occlusion or proximal high-grade arterial stenosis. Soft plaque results moderate/severe stenosis at the origin of  both vertebral arteries.   Repeat CT Head x 3 - Stable parenchymal hemorrhage in the right thalamus and basal ganglia with intraventricular extension as described.   2D ECHO LVEF 60-65%. mild concentric left ventricular hypertrophy  LDL 167  HgbA1c 6.0  Vitamin B12 301  VTE prophylaxis -  Lovenox  No antithrombotic prior to admission, now on No antithrombotic due to ICH.   Therapy recommendations: SNF  Disposition: TBD - Dr. Denese Killings will update pt husband today  Slight obstructive hydrocephalus  CT Head -  IVH in the third and fourth ventricles and nearly filling the right lateral ventricle.  Ventricle size slightly larger than her baseline CT in 04/2020  Repeat CT head x 3 stable ventricle size.   NSG on board  S/P ventricular catheter placement on 08/27/2020. Neurosurgery clamped EVD 08/29/20 and dc on 08/30/20  Minimal drainage  Intracranial pressure not high  Stroke - scattered b/l hippocampus, posterior CC, bilateral thalamus, and upper pons - likely due to deep penetrating vessel compression by focal mass-effect  MRI brain 2/11 right greater than left mesial temporal lobes / parahippocampal cortex, bilateral basal ganglia, and possibly the upper pons that are concerning for acute infarcts, most likely secondary to mass effect from the hemorrhage.  MRA Normal  EEG No seizures or epileptiform discharges  Off 3% saline   MRI repeat Acute to early subacute infarcts in the bilateral cerebral white matter and right cerebellum, new from 08/28/2020. Interval evolution of multiple other infarcts present on the prior MRI. 3. Unchanged size of large hemorrhage centered in the right thalamus with extension into the midbrain. Unchanged 6 mm of leftward midline shift.  Hypertensive emergency hypotension  Home meds: Coreg 6.25 mg and Hydrochlorothiazide 25 mg daily  Unstable  . BP goal SP <160 mmHG  . On Claviprex   . Long-term BP goal normotensive . Continue Coreg 12.5  and Hydrochlorothiazide 25 mg Daily . Add amlodipine 5  .  Hyperlipidemia  Home meds: None   LDL 167, goal < 70  Hold starting statin due to acute ICH  May consider statin at discharge  AKI  Cre 1.09 -> 1.94>1.09>1.04>1.07->0.93  Continue IV fluid  Foley placed on 08/28/20  BMP monitoring  Anemia   Hb 7.4->7.1  S/p PRBC today  CCM on board  CBC monitoring  Monitor and transfuse per protocol.  Fever, could be  central fever  ? Sinusitis   Vancomycin and Cefipime -> Ancef  Temp 101.1->100.3->100.6->100.8  WBC 13.0 >10.1>11.4->12.1  Cultures Negative  UA neg  CXR neg  Other Stroke Risk Factors  Noncompliance   Hospital day #10  This patient is critically ill due to ICH, hydrocephalus, hypertensive emergency, stroke, anemia needing PRBC and at significant risk of neurological worsening, death form sepsis, recurrent stroke, hematoma expansion, brain herniation. This patient's care requires constant monitoring of vital signs, hemodynamics, respiratory and cardiac monitoring, review of multiple databases, neurological assessment, discussion with family, other specialists and medical decision making of high complexity. I spent 30 minutes of neurocritical care time in the care of this patient.  Marvel Plan, MD PhD Stroke Neurology 09/06/2020 11:57 AM   To contact Stroke Continuity provider, please refer to [email or web address]. After hours, contact General Neurology

## 2020-09-06 NOTE — Progress Notes (Signed)
Patient ID: Charlene [name], female   DOB: 06/02/1966, 55 y.o.   MRN: [id number]  Medical records reviewed   55 year old female with past medical history as below, which is significant for hypertension and reported medication noncompliance.  Presented to the emergency department at [facility] 2/9 with c/o headache and left-sided weakness.  STAT CT Head demonstrated right thalamic/basal ganglia hemorrhage with intraventricular extension.  She was transferred to [facility] emergency department for in person neurology evaluation. There was evidence of obstructive hydrocephalus and neurosurgery was consulted. External ventricular drain was placed 2/10. In the hours following she became increasingly obtunded. PCCM was consulted. Upon PCCM arrival the patient became apneic and was intubated. EVD removed 2/13   Unfortunately patient has showed no signs of neurologic improvement  Family meeting on 09-03-20   Patient remains intubated/respiratory failure, with extensor posturing, unresponsive to gentle touch and verbal stimuli.  Likely long term poor prognosis.  This NP assessed patient and discussed with bedside RN.    I spoke to husbabnd/[name] Morris by telephone. Husband remains legal decision maker at this time,  I spoke to patient's father/ Hilbert Odor by telephone. He remains designated hospital visitor.     Ongoing education  regarding current medical situation offered; diagnosis, prognosis, treatment option decisions and options discussed.    At this time family is not interested in conversation regarding treatment plan decisions.  Discussed  the importance of continued conversation with family and the  medical providers regarding overall plan of care and treatment options,  ensuring decisions are within the context of the patients values and GOCs.  Questions and concerns addressed   PMT will continue to support holistically.   Total time spent on the unit was 35 minutes  Greater than 50% of the  time was spent in counseling and coordination of care  Lorinda Creed NP  Palliative Medicine Team Team Phone # [phone] Pager [phone]

## 2020-09-06 NOTE — Progress Notes (Signed)
PHARMACY - PHYSICIAN COMMUNICATION CRITICAL VALUE ALERT - BLOOD CULTURE IDENTIFICATION (BCID)  Charlene [name] is an 55 y.o. female who presented to [facility] on 08/26/2020 with a chief complaint of hemorrhagic stroke  Assessment:  BCID with 1/2 bld cx growing staph epi (mecA resistance) - only one bottle was drawn from each site. Talked to Dr. Dellie Catholic - could be contaminant but with fever last p.m., he wants to give one time Vanc dose and defer to dayshift MD for further coverage  Name of physician (or Provider) Contacted: Dr. Dellie Catholic  Current antibiotics: Ancef  Changes to prescribed antibiotics recommended:  Vanc 1250mg  IV x 1 and defer to dayshift MD for further coverage  Results for orders placed or performed during the hospital encounter of 08/26/20  Blood Culture ID Panel (Reflexed) (Collected: 09/05/2020  4:57 AM)  Result Value Ref Range   Enterococcus faecalis NOT DETECTED NOT DETECTED   Enterococcus Faecium NOT DETECTED NOT DETECTED   Listeria monocytogenes NOT DETECTED NOT DETECTED   Staphylococcus species DETECTED (A) NOT DETECTED   Staphylococcus aureus (BCID) NOT DETECTED NOT DETECTED   Staphylococcus epidermidis DETECTED (A) NOT DETECTED   Staphylococcus lugdunensis NOT DETECTED NOT DETECTED   Streptococcus species NOT DETECTED NOT DETECTED   Streptococcus agalactiae NOT DETECTED NOT DETECTED   Streptococcus pneumoniae NOT DETECTED NOT DETECTED   Streptococcus pyogenes NOT DETECTED NOT DETECTED   A.calcoaceticus-baumannii NOT DETECTED NOT DETECTED   Bacteroides fragilis NOT DETECTED NOT DETECTED   Enterobacterales NOT DETECTED NOT DETECTED   Enterobacter cloacae complex NOT DETECTED NOT DETECTED   Escherichia coli NOT DETECTED NOT DETECTED   Klebsiella aerogenes NOT DETECTED NOT DETECTED   Klebsiella oxytoca NOT DETECTED NOT DETECTED   Klebsiella pneumoniae NOT DETECTED NOT DETECTED   Proteus species NOT DETECTED NOT DETECTED   Salmonella species NOT DETECTED NOT  DETECTED   Serratia marcescens NOT DETECTED NOT DETECTED   Haemophilus influenzae NOT DETECTED NOT DETECTED   Neisseria meningitidis NOT DETECTED NOT DETECTED   Pseudomonas aeruginosa NOT DETECTED NOT DETECTED   Stenotrophomonas maltophilia NOT DETECTED NOT DETECTED   Candida albicans NOT DETECTED NOT DETECTED   Candida auris NOT DETECTED NOT DETECTED   Candida glabrata NOT DETECTED NOT DETECTED   Candida krusei NOT DETECTED NOT DETECTED   Candida parapsilosis NOT DETECTED NOT DETECTED   Candida tropicalis NOT DETECTED NOT DETECTED   Cryptococcus neoformans/gattii NOT DETECTED NOT DETECTED   Methicillin resistance mecA/C DETECTED (A) NOT DETECTED    09/07/2020, PharmD, BCPS Please see amion for complete clinical pharmacist phone list 09/06/2020  5:45 AM

## 2020-09-07 ENCOUNTER — Inpatient Hospital Stay (HOSPITAL_COMMUNITY): Payer: Medicaid Other

## 2020-09-07 LAB — CBC
HCT: 29.9 % — ABNORMAL LOW (ref 36.0–46.0)
Hemoglobin: 9.5 g/dL — ABNORMAL LOW (ref 12.0–15.0)
MCH: 25.1 pg — ABNORMAL LOW (ref 26.0–34.0)
MCHC: 31.8 g/dL (ref 30.0–36.0)
MCV: 79.1 fL — ABNORMAL LOW (ref 80.0–100.0)
Platelets: 360 10*3/uL (ref 150–400)
RBC: 3.78 MIL/uL — ABNORMAL LOW (ref 3.87–5.11)
RDW: 15.9 % — ABNORMAL HIGH (ref 11.5–15.5)
WBC: 10.3 10*3/uL (ref 4.0–10.5)
nRBC: 0 % (ref 0.0–0.2)

## 2020-09-07 LAB — BASIC METABOLIC PANEL
Anion gap: 11 (ref 5–15)
BUN: 21 mg/dL — ABNORMAL HIGH (ref 6–20)
CO2: 30 mmol/L (ref 22–32)
Calcium: 8.9 mg/dL (ref 8.9–10.3)
Chloride: 98 mmol/L (ref 98–111)
Creatinine, Ser: 0.9 mg/dL (ref 0.44–1.00)
GFR, Estimated: >60 mL/min (ref >60)
Glucose, Bld: 138 mg/dL — ABNORMAL HIGH (ref 70–99)
Potassium: 4 mmol/L (ref 3.5–5.1)
Sodium: 139 mmol/L (ref 135–145)

## 2020-09-07 LAB — GLUCOSE, CAPILLARY
Glucose-Capillary: 115 mg/dL — ABNORMAL HIGH (ref 70–99)
Glucose-Capillary: 119 mg/dL — ABNORMAL HIGH (ref 70–99)
Glucose-Capillary: 133 mg/dL — ABNORMAL HIGH (ref 70–99)
Glucose-Capillary: 149 mg/dL — ABNORMAL HIGH (ref 70–99)
Glucose-Capillary: 150 mg/dL — ABNORMAL HIGH (ref 70–99)
Glucose-Capillary: 156 mg/dL — ABNORMAL HIGH (ref 70–99)
Glucose-Capillary: 173 mg/dL — ABNORMAL HIGH (ref 70–99)

## 2020-09-07 LAB — TYPE AND SCREEN
ABO/RH(D): O POS
Antibody Screen: NEGATIVE
Unit division: 0

## 2020-09-07 LAB — BPAM RBC
Blood Product Expiration Date: 202203222359
ISSUE DATE / TIME: 202202201054
Unit Type and Rh: 5100

## 2020-09-07 LAB — CULTURE, BLOOD (ROUTINE X 2): Special Requests: ADEQUATE

## 2020-09-07 IMAGING — CT CT HEAD W/O CM
4 of 6 series · 14 of 47 positions shown, 16 images · non-contrast
Comparison: [DATE], MRI [DATE]

CLINICAL DATA: Intracranial hemorrhage and infarcts, follow-up

EXAM:
CT HEAD WITHOUT CONTRAST
TECHNIQUE: Contiguous axial images were obtained from the base of the skull
through the vertex without intravenous contrast.

[Series 6: head without sag · sagittal · non-contrast · 0.31mm/px · 3 of 63 slices shown]
[im 23/63  brain]
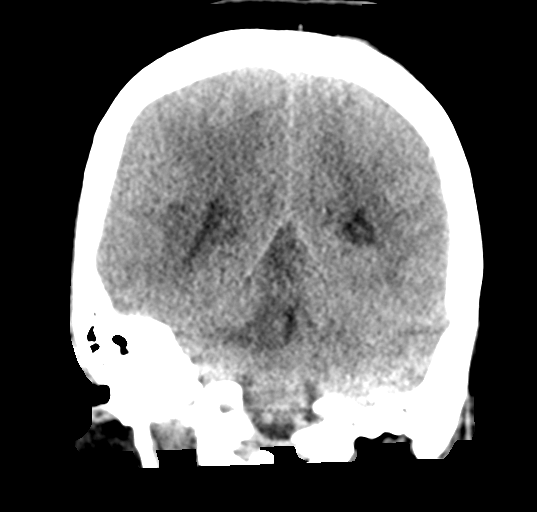
[im 32/63  brain]
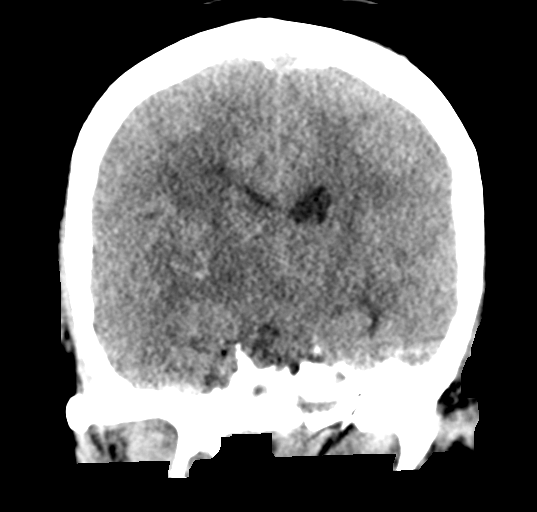
[im 41/63  brain]
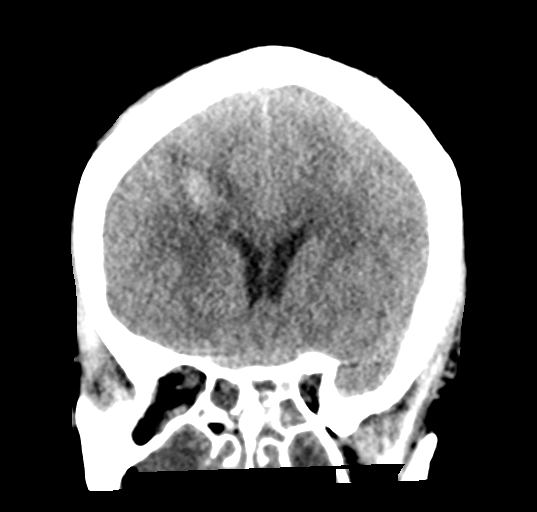

[Series 7: head without ax · axial · non-contrast · 0.33mm/px · z∈[-110,+10]mm · 6 of 57 slices shown, 8 images (1 of 2)]
[im 9/57  brain]
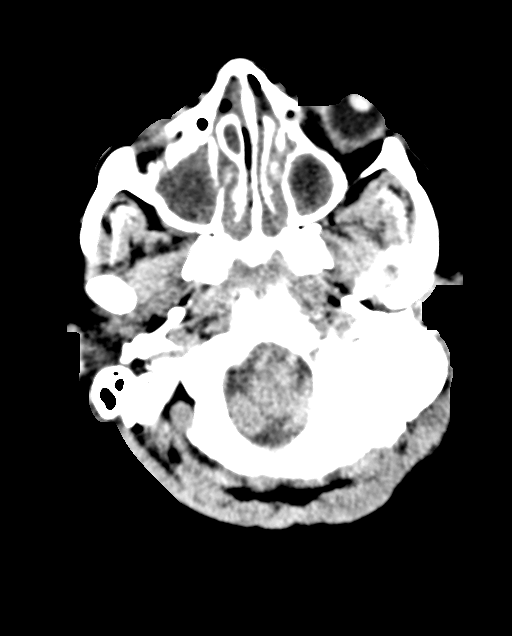
[im 9/57  bone]
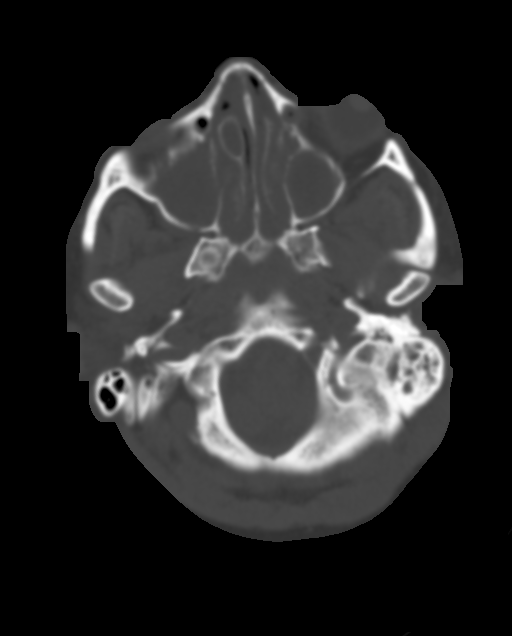
[im 17/57  brain]
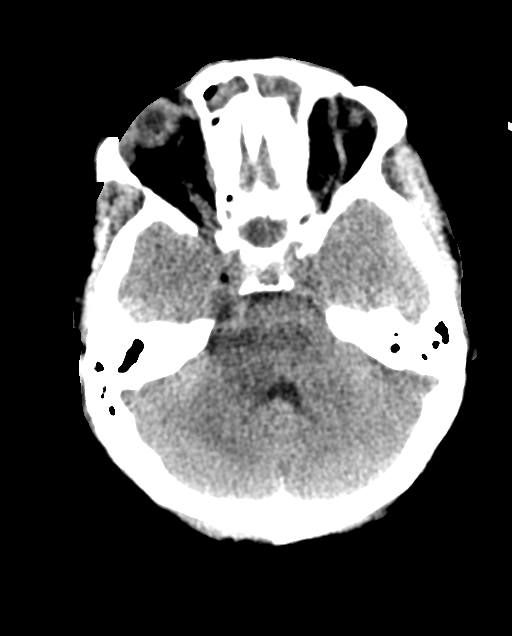
[im 25/57  brain]
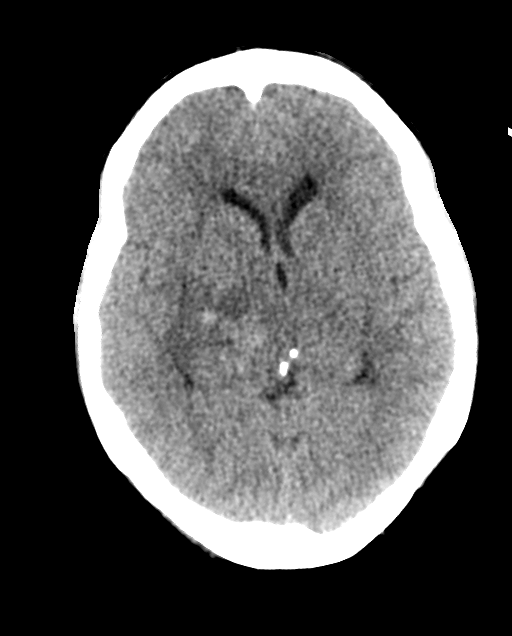
[im 33/57  brain]
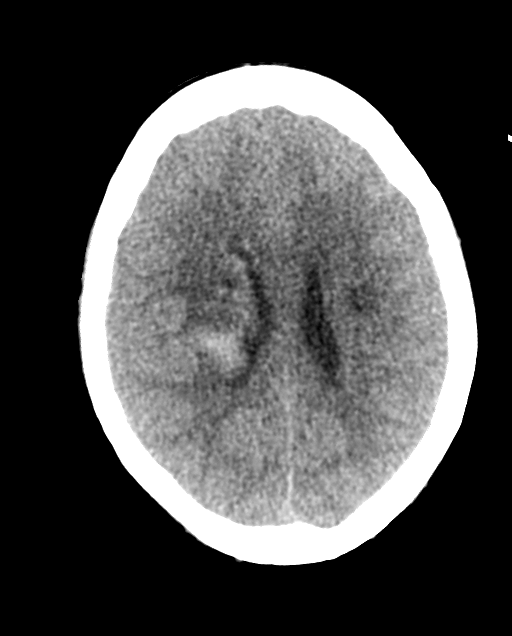
[im 41/57  brain]
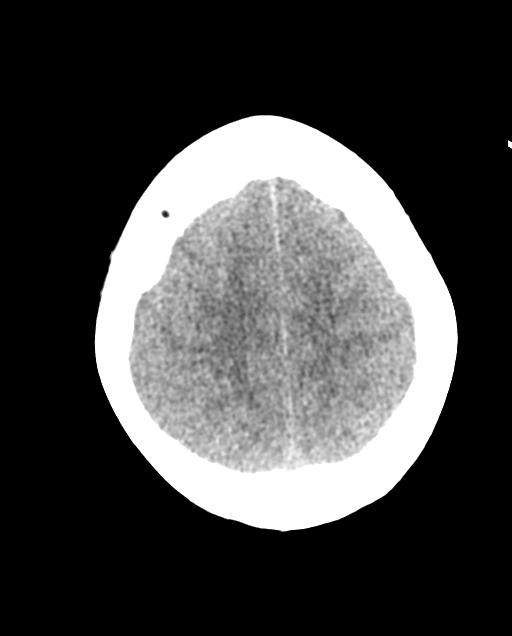
[im 41/57  bone]
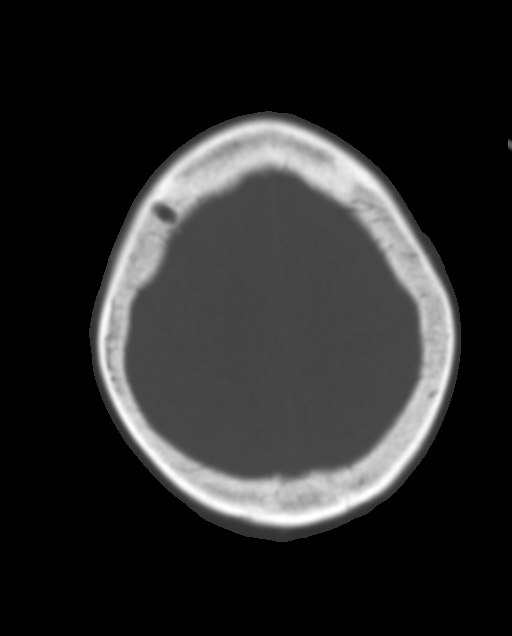
[im 49/57  brain]
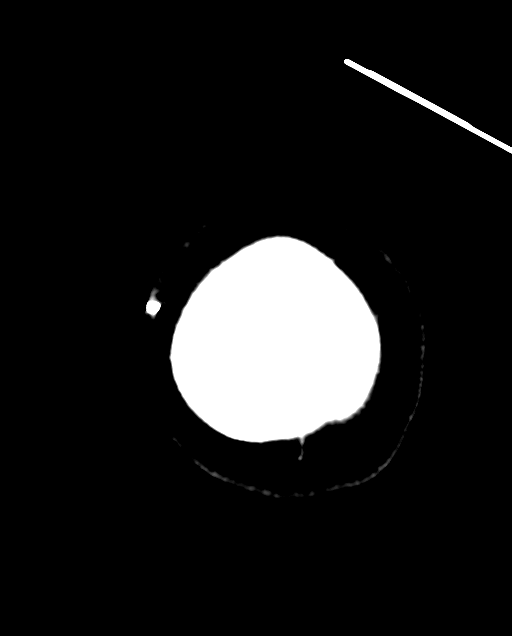

[Series 9: head without cor · coronal · non-contrast · 0.40mm/px · 3 of 52 slices shown]
[im 18/52  brain]
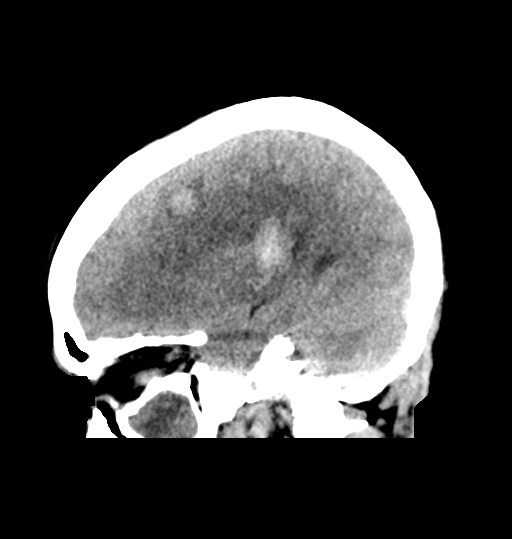
[im 23/52  brain]
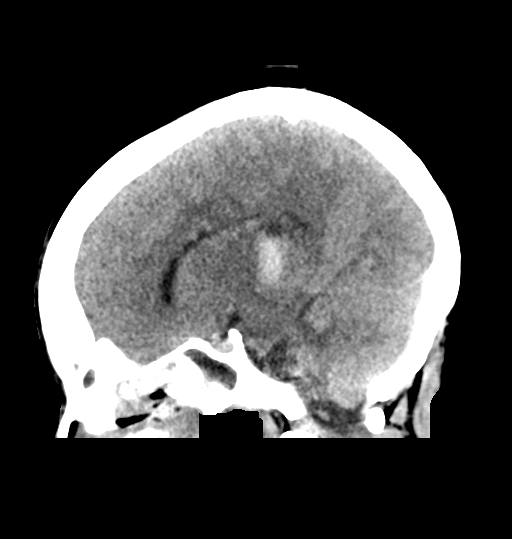
[im 29/52  brain]
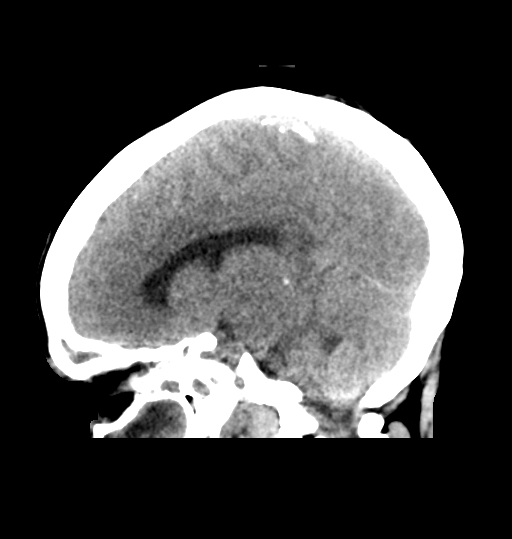

[Series 10: head without ax · axial · non-contrast · 0.34mm/px · z∈[-89,-65]mm · 2 of 50 slices shown (2 of 2)]
[im 9/50  brain]
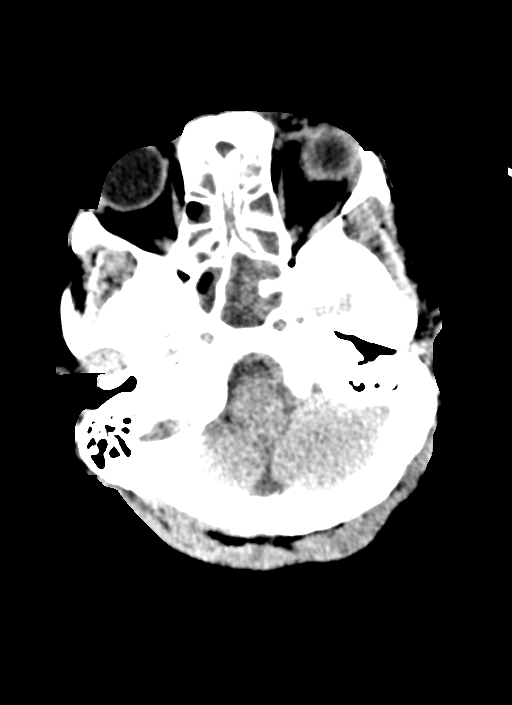
[im 17/50  brain]
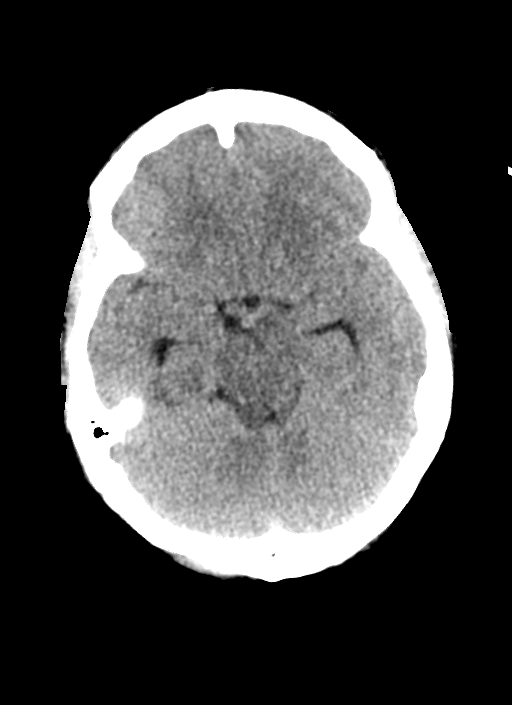

[14 of 47 positions shown; findings below may reference images not displayed]

FINDINGS: Brain: Expected evolution of intracranial hemorrhage involving the
right thalamus, right basal ganglia, and adjacent white matter.
Small volume residual hemorrhage along prior right frontal
ventricular catheter. Decreased intraventricular hemorrhage.
Ventricle caliber similar to prior MRI. Persistent minor leftward
midline shift. Persistent sulcal effacement.

Multiple areas of low-attenuation are identified in the central
white matter and dorsal brainstem corresponding to some of the
infarcts on prior MRI.

Vascular: No new findings.

Skull: Right frontal burr hole.  Otherwise unremarkable.

Sinuses/Orbits: Nonspecific diffuse paranasal sinus opacification.
Left greater than right mastoid and partial right middle ear
opacification. Orbits are unremarkable.

Other: None.
IMPRESSION: Expected evolution of intracranial hemorrhage since [DATE] head
CT. Multiple areas of low attenuation corresponding to some of the
infarcts seen on prior MRI.

Ventricle caliber similar to prior MRI.

## 2020-09-07 MED ORDER — CLEVIDIPINE BUTYRATE 0.5 MG/ML IV EMUL: 0.0000 mg/h | INTRAVENOUS | Status: DC

## 2020-09-07 NOTE — Progress Notes (Signed)
Patient transported to CT and returned to 4N30. No complications. RT will continue to monitor.

## 2020-09-07 NOTE — Progress Notes (Addendum)
STROKE TEAM PROGRESS NOTE   INTERVAL HISTORY Pt is evaluated at the bedside. No family is at the bedside. RN present in the room. Pt still intubated on vent, posturing of BUEs (Lt>RT)and withdraw of BLEs on pain stimulation. No significant neuro changes. Eyes in dysconjugate position. Dolls eyes's absent. Corneal reflex weak B/L. Gag and cough present. No ventilatory effort.  BP systolic between 175-102 mmHg and diastolic between 58-52 mmHg. Pt is Afebrile now but had temp of 99.7 at 400. Pt was started on Cefazolin for sinusitis. WBC downtrend to 10.3, Hb 9.5, Creatinine 0.90. BCId with 1/2 blood culture staph epi (mecA resistance), Could be contaminant. Dr Dellie Catholic want to give 1 dose of Vancomycin and CCM to decide further coverage. Repeat CT showed Expected evolution of intracranial hemorrhage.  Multiple areas of low attenuation corresponding to some of the infarcts seen on prior MRI.   Vitals:   09/07/20 0600 09/07/20 0700 09/07/20 0741 09/07/20 0800  BP: 106/69 (!) 109/91 (!) 109/91   Pulse: 96 93    Resp: 18 15    Temp:    98.3 F (36.8 C)  TempSrc:    Axillary  SpO2: 100% 100%    Weight:      Height:       CBC:  Recent Labs  Lab 09/03/20 0944 09/05/20 0457 09/07/20 0452  WBC 11.4* 12.1* 10.3  NEUTROABS 9.0*  --   --   HGB 7.4* 7.1* 9.5*  HCT 22.7* 22.1* 29.9*  MCV 76.4* 77.3* 79.1*  PLT 207 280 360   Basic Metabolic Panel:  Recent Labs  Lab 09/01/20 0347 09/01/20 1014 09/05/20 0457 09/07/20 0452  NA 151*   < > 145 139  K 3.8   < > 3.5 4.0  CL 118*   < > 100 98  CO2 19*   < > 31 30  GLUCOSE 178*   < > 152* 138*  BUN 26*   < > 33* 21*  CREATININE 1.04*   < > 0.93 0.90  CALCIUM 8.6*   < > 9.0 8.9  PHOS 4.3  --   --   --    < > = values in this interval not displayed.   Lipid Panel:  Recent Labs  Lab 09/03/20 0353  TRIG 119   HgbA1c:  No results for input(s): HGBA1C in the last 168 hours. Urine Drug Screen:  No results for input(s): LABOPIA, COCAINSCRNUR,  LABBENZ, AMPHETMU, THCU, LABBARB in the last 168 hours.  Alcohol Level  No results for input(s): ETH in the last 168 hours.  IMAGING past 24 hours No results found.  PHYSICAL EXAM  Temp:  [98.3 F (36.8 C)-100.6 F (38.1 C)] 98.3 F (36.8 C) (02/21 0800) Pulse Rate:  [91-115] 93 (02/21 0700) Resp:  [14-28] 15 (02/21 0700) BP: (106-170)/(65-99) 109/91 (02/21 0741) SpO2:  [98 %-100 %] 100 % (02/21 0700) FiO2 (%):  [30 %] 30 % (02/21 0743)  General - Well nourished, well developed, intubated off sedation.  Ophthalmologic - fundi not visualized due to noncooperation.  Cardiovascular - Regular rhythm and tachycardia.  Neuro - intubated off sedation, eyes closed, not following commands. With forced eye opening, left eye in downward position, right eye mid position, left pupil 1.40mm, right 48mm, not reactive to light, doll's eyes absent, not tracking. Corneal reflex weakly present bilaterally, gag and cough present. Breathing over the vent.  Facial symmetry not able to test due to ET tube.  Tongue protrusion not cooperative. On pain stimulation, BUE extension  posturing (Lt >Rt), BLE mild withdraw. Bilateral positive babinski. Sensation, coordination and gait not tested.   ASSESSMENT/[name]  Charlene [name] is a 55 y.o. female with history of hypertension with difficulty maintaining blood pressures and noncompliance to medications for various reasons, presented to the emergency room at [facility] for left-sided weakness and headaches.  She was appear to be confused and lethargic. She was seen by telemedicine evaluation by neurologist for concern for stroke and a noncontrast head CT showed a right thalamic/basal ganglia hemorrhage 3.2 x 2.1 x 2.5  with intraventricular extension.  He was transferred ED to ED for inpatient neurological evaluation and admission. BP high at  178/109 mmHg on Admission. CTA head & neck showed No intracranial large vessel occlusion or proximal  high-grade arterial stenosis. 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 Repeat CT Head on 08/27/20 showed- Stable parenchymal hemorrhage in the right thalamus and basal ganglia with intraventricular extension. ECHO LVEF 60-65%. mild concentric left ventricular hypertrophy. HgbA1c 6.0. LDL 167. Repeat CT head (08/27/20 )showed Stable parenchymal hemorrhage in the right thalamus and basal ganglia with intraventricular extension. S/P ventricular catheter placement on 08/27/2020. Following that Pt had labored breathing so intubated. CT head repeat post procedure didn't demonstrate any increase in size of hemorrhage.  3% normal saline was stopped. PT recommends SNF. MRA normal, MRI showed scattered b/l hippocampus, posterior CC, bilateral thalamus, and upper pons. Pt is on Vanco and Cefipime due to fever but stopped after negative cultures. Cultures negative until now. Goals of care discussed with family by Attending  and palliative care. Family meeting held on 09/03/20. Palliative care and CCM on board.  Cefazolin for sinusitis started on 09/05/20 . Repeat CT showed Expected evolution of intracranial hemorrhage.  Multiple areas of low attenuation corresponding to some of the infarcts seen on prior MRI.  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   CT Head -  Right thalamic ICH with IVH in the third and fourth ventricles and nearly filling the right lateral ventricle. No hydrocephalus.   CTA head & neck - No intracranial large vessel occlusion or proximal high-grade arterial stenosis. 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   Repeat CT Head x 3 - Stable parenchymal hemorrhage in the right thalamus and basal ganglia with intraventricular extension as described.   2D ECHO LVEF 60-65%. mild concentric left ventricular hypertrophy  Repeat CT 09/07/20- Expected evolution of  intracranial hemorrhage.  Multiple areas of low attenuation corresponding to some of the infarcts seen on prior MRI.  LDL 167  HgbA1c 6.0  Vitamin B12 301  VTE prophylaxis -  Lovenox  No antithrombotic prior to admission, now on No antithrombotic due to ICH.   Therapy recommendations: SNF  Disposition: husband is the POA, leaning toward trach and PEG  Slight obstructive hydrocephalus  CT Head -  IVH in the third and fourth ventricles and nearly filling the right lateral ventricle.  Ventricle size slightly larger than her baseline CT in 04/2020  Repeat CT head x 3 stable ventricle size.   NSG on board  S/P ventricular catheter placement on 08/27/2020. Neurosurgery clamped EVD 08/29/20 and dc on 08/30/20  Minimal drainage  Intracranial pressure not high  Stroke - scattered b/l hippocampus, posterior CC, bilateral thalamus, and upper pons - likely due to deep penetrating vessel compression by focal mass-effect  MRI brain 2/11 right greater than  left mesial temporal lobes / parahippocampal cortex, bilateral basal ganglia, and possibly the upper pons that are concerning for acute infarcts, most likely secondary to mass effect from the hemorrhage.  MRA Normal  EEG No seizures or epileptiform discharges  Off 3% saline   MRI repeat Acute to early subacute infarcts in the bilateral cerebral white matter and right cerebellum, new from 08/28/2020. Interval evolution of multiple other infarcts present on the prior MRI. 3. Unchanged size of large hemorrhage centered in the right thalamus with extension into the midbrain. Unchanged 6 mm of leftward midline shift.  Hypertensive emergency hypotension  Home meds: Coreg 6.25 mg and Hydrochlorothiazide 25 mg daily (Non complaint)  Unstable   BP goal SP <160 mmHG   Off Claviprex now  Long-term BP goal normotensive  Continue Coreg 12.5 and Hydrochlorothiazide 25 mg Daily  Continue amlodipine 5  mg   Hyperlipidemia  Home meds:  None   LDL 167, goal < 70  Hold starting statin due to acute ICH  May consider statin at discharge  AKI  Cre 1.09 -> 1.94>1.09>1.04>1.07->0.93>0.90  Continue IV fluid  Foley placed on 08/28/20  BMP monitoring  Anemia   Hb 7.4->7.1>PRBC->9.5  S/p PRBC 2/20  CCM on board  CBC monitoring  Monitor and transfuse per protocol.  Fever, could be central fever  ? Sinusitis   Vancomycin and Cefipime -> Ancef (09/05/20)  Temp 101.1->100.3->100.6->100.8>99.7  WBC 13.0 >10.1>11.4->12.1>10.3  Bld Cultures -1/2 bottles growing staph epi (mecA resistance), could be contaminant.   UA neg  CXR neg  Other Stroke Risk Factors  Noncompliance   Hospital day #11  ATTENDING NOTE: I reviewed above note and agree with the assessment and [name]. Pt was seen and examined.   No family at bedside. Pt still intubated, not responsive. Per Dr. Denese Killings, pt today seems more apenic today, repeat CT stable. Otherwise, neuro unchanged. I called pt husband and had long discussion with him, updated pt current condition, treatment [name] and potential poor prognosis, and answered all the questions. He expressed understanding and appreciation.   For detailed assessment and [name], please refer to above as I have made changes wherever appropriate.   Charlene Plan, MD PhD Stroke Neurology 09/07/2020 5:49 PM  This patient is critically ill due to thalamic and brainstem ICH with IVH, cerebral edema, bilateral strokes and at significant risk of neurological worsening, death form brain herniation, ventilation related complications, seizure, hydrocephalus, cardiac arrest. This patient's care requires constant monitoring of vital signs, hemodynamics, respiratory and cardiac monitoring, review of multiple databases, neurological assessment, discussion with family, other specialists and medical decision making of high complexity. I spent 40 minutes of neurocritical care time in the care of this patient.     To  contact Stroke Continuity provider, please refer to [email or web address]. After hours, contact General Neurology

## 2020-09-07 NOTE — Progress Notes (Signed)
NAME:  Charlene [name], MRN:  [id number], DOB:  06-21-1966, LOS: 98 ADMISSION DATE:  08/26/2020, CONSULTATION DATE:  2/10 REFERRING MD:  Dr. Erlinda Hong, CHIEF COMPLAINT:  ICH   Brief History:  55 year old female admitted with hypertensive R thalamic/basal ganglia ICH. Resultant obstructive hydrocephalus prompting EVD placement 2/10. Subsequently, 2/10 she had acute mental status change to obtundation and respiratory arrest resulting in intubation.  Past Medical History:  HTN, medication noncompliance  Significant Hospital Events:  2/09 Admit for ICH 2/10 EVD placed, later intubated due to obtundation and respiratory arrest 2/11 MRI Brain, hypertonic saline d/c'd, EVD not working.  Husband did not consent to PICC  2/13 EVD removed 2/17 GOC discussion with family, PMT  Consults:  Stroke PCCM Neurosurgery  Procedures:  ETT 2/10 >> EVD 2/10 >> non functioning 2/11 at 1500; clamped 2/12 am >> 2/13  Significant Diagnostic Tests:   CT Head 2/9 >> Acute hemorrhage with the epicenter in the right thalamus. Measurement is approximately 3.2 x 2.1 x 2.5 cm (volume 8.8 cm^3). Intraventricular penetration with blood filling the third and fourth ventricles and nearly filling the right lateral ventricle. No hydrocephalus.  CTA Head/Neck 2/10 >> Unchanged size of intraparenchymal hemorrhage centered in the right thalamus and basal ganglia with extension into the right lateral ventricle. 2. No intracranial arterial occlusion or high-grade stenosis. No dissection, aneurysm or hemodynamically significant stenosis of the carotid or vertebral arteries.  CT Head 2/10 >> Stable parenchymal hemorrhage in the right thalamus and basal ganglia with intraventricular extension as described. No new focal abnormality is noted.  CT Head 2/10 (post intubation) >> redemonstrated intraparenchymal hemorrhage centered within the R thalamus/basal ganglia extending into the cerebral peduncle/midbrain with associated  intraventricular extension and hemorrhage in the R lateral ventricle and fourth ventricle, overall not significantly changed from prior; stable mass effect, 73m of R to L midline shift, postsurgical changes of ventriculostomy  TTE 2/10 >> LVEF 60 to 65%, LV has normal function, no regional wall motion abnormalities, mild concentric LVH.  RV systolic function is normal, normal pulmonary artery systolic pressure.   MRA head 2/11 >>Negative intracranial MR angiography. No evidence of aneurysm or high flow vascular malformation to explain the intraparenchymal hemorrhage   MRI brain 2/11 >> Similar intraparenchymal hemorrhage centered within the right thalamus and basal ganglia extending into the right midbrain with intraventricular extension, effacement of the prepontine cistern and similar 4 mm of leftward midline shift; separate areas of restricted diffusion and edema involving the R > L mesial temporal lobes/parahippocampal cortex, bilateral basal ganglia, and possibly the upper pons that are c/f acute infarcts, most likely secondary to mass effect from hemorrhage.  MRI brain 2/19 >> intraventricular clot has decreased in size but mid-brain parenchymal clot has not and there are more ischemic infarcts in the surrounding brain.   Micro Data:  2/9 COVID + Influenza negative 2/10 MRSA PCR negative 2/12 Tracheal aspirate >> 2/12 BCx 2 >>  Antimicrobials:  Cefepime 2/12 >> 2/15 Vanc 2/12 >> 2/15  Interim History / Subjective:  Continues with HTN  T-max 100.8 overnight   Objective   Blood pressure (!) 109/91, pulse 93, temperature 99.7 F (37.6 C), temperature source Axillary, resp. rate 15, height '5\' 5"'  (1.651 m), weight 65.5 kg, last menstrual period 03/03/2014, SpO2 100 %, unknown if currently breastfeeding.    Vent Mode: PRVC FiO2 (%):  [30 %] 30 % Set Rate:  [14 bmp] 14 bmp Vt Set:  [450 mL] 450 mL PEEP:  [5 cmH20] 5  cmH20 Pressure Support:  [10 cmH20] 10 cmH20 Plateau Pressure:  [14  cmH20-15 cmH20] 15 cmH20   Intake/Output Summary (Last 24 hours) at 09/07/2020 0741 Last data filed at 09/07/2020 0700 Gross per 24 hour  Intake 2991.45 ml  Output 550 ml  Net 2441.45 ml   Filed Weights   09/04/20 0430 09/05/20 0500 09/06/20 0400  Weight: 62.6 kg 63.4 kg 65.5 kg   Examination: General: Acute ill appearing middle aged adult female on mechanical ventilation, in NAD HEENT: ETT, MM pink/moist, significant left downward gaze, slight right upward gaze Neuro: Continue to show signs of posturing, RASS -5 on no sedation  CV: s1s2 regular rate and rhythm, no murmur, rubs, or gallops,  PULM:  Clear to ascultation, no added breath sounds, no increased work of breathing,  GI: soft, bowel sounds active in all 4 quadrants, non-tender, non-distended, tolerating TF Extremities: warm/dry, no edema  Skin: no rashes or lesions  Resolved Hospital Problem list   Hypernatremia   Assessment & Plan:  Multiple acute strokes  - Per neuro "scattered b/l hippocampus, posterior CC, bilateral thalamus, and upper pons, likely due to deep penetrating vessel compression by focal mass-effect" Intraparenchymal hemorrhage with right thalamic hemorrhage with extension into the third fourth and right lateral ventricle -Secondary to HTN and medication noncompliance  Obstructive hydrocephalus -S/P EVD placement 2/10, subsequently clamped and removed 2/13 P: Management per neurology  Maintain neuro protective measures; goal for eurothermia, euglycemia, eunatermia, normoxia, and PCO2 goal of 35-40 Neuro exam continues to remain poor with signs of posturing Chance of meaninful neurologic recovery is slim   Nutrition and bowel regiment  Seizure precautions  Aspirations precautions  S.P 3% saline   Hypertensive emergency -Home medications include Coreg and HCTZ P:  SBP goal <160 Continue Norvasc, Coreg, abd HCTZ PRN IV antihypertensives  Closely monitor hemodynamics in the ICU setting   Acute  hypoxemic respiratory failure secondary to CVA P: Continue ventilator support with lung protective strategies  Wean PEEP and FiO2 for sats greater than 90%. Head of bed elevated 30 degrees. Plateau pressures less than 30 cm H20.  Follow intermittent chest x-ray and ABG.   SAT/SBT as tolerated, mentation preclude extubation  Ensure adequate pulmonary hygiene  Follow cultures  VAP bundle in place  PAD protocol  Central fever  P: Fever trend improving  T-max 100.8 overnight  Supportive care  Buspirone started 2/20  Possible bacterial sinusitis -Extensive diffuse paranasal sinus opacification and moderate mastoid effusions in the setting of intubation. P: Currently on Ancef  Stop date in place   Anemia P: Stable  Trend CBC  Transfuse per protocol     Daily Goals Checklist  Pain/Anxiety/Delirium protocol (if indicated): None Neuro vitals: every 4 hours AED's: None VAP protocol (if indicated): Bundle in place Respiratory support goals: Switch to pressure support.  Leave on pressure support as tolerated. Blood pressure target: 120-160.  DVT prophylaxis: Enoxaparin Nutrition Status: Tolerating tube feeds GI prophylaxis: Protonix Fluid status goals: Allow Urinary catheter: External catheter Central lines: PIV only Glucose control: Well-controlled on correction scale Mobility/therapy needs: Bedrest Antibiotic de-escalation: cefazolin for 10 days.  Home medication reconciliation: On hold Daily labs: CBC, BMP twice weekly Code Status: Full Disposition: ICU.  Will likely need tracheostomy PEG tube if fmaily wants aggressive care   Goals of Care:  Last date of multidisciplinary goals of care discussion: 2/17 Family and staff present: Multiple family members, Wadie Lessen with PMT, Dr. Leonie Man, Dr. Cyndy Freeze, and Dr. Ander Slade Summary of discussion: Per chart  review PMT met with family 2/17 with no decision reached  Follow up goals of care discussion due: Pending  Code Status:  Full  CRITICAL CARE Performed by: Johnsie Cancel  Total critical care time: 40 minutes  Critical care time was exclusive of separately billable procedures and treating other patients.  Critical care was necessary to treat or prevent imminent or life-threatening deterioration.  Critical care was time spent personally by me on the following activities: development of treatment plan with patient and/or surrogate as well as nursing, discussions with consultants, evaluation of patient's response to treatment, examination of patient, obtaining history from patient or surrogate, ordering and performing treatments and interventions, ordering and review of laboratory studies, ordering and review of radiographic studies, pulse oximetry and re-evaluation of patient's condition.  Johnsie Cancel, NP-C [facility] Personal contact information can be found on Amion  If no response please page: Adult pulmonary and critical care medicine pager on Amion unitl 7pm After 7pm please call [phone] 09/07/2020, 8:08 AM

## 2020-09-08 DIAGNOSIS — R509 Fever, unspecified: Secondary | ICD-10-CM

## 2020-09-08 DIAGNOSIS — G936 Cerebral edema: Secondary | ICD-10-CM

## 2020-09-08 LAB — GLUCOSE, CAPILLARY
Glucose-Capillary: 177 mg/dL — ABNORMAL HIGH (ref 70–99)
Glucose-Capillary: 123 mg/dL — ABNORMAL HIGH (ref 70–99)
Glucose-Capillary: 149 mg/dL — ABNORMAL HIGH (ref 70–99)
Glucose-Capillary: 167 mg/dL — ABNORMAL HIGH (ref 70–99)
Glucose-Capillary: 138 mg/dL — ABNORMAL HIGH (ref 70–99)

## 2020-09-08 NOTE — Progress Notes (Addendum)
NAME:  Charlene [name], MRN:  [id number], DOB:  [birth year], LOS: 64 ADMISSION DATE:  08/26/2020, CONSULTATION DATE:  2/10 REFERRING MD:  Dr. Erlinda Hong, CHIEF COMPLAINT:  ICH   Brief History:  55 year old female admitted with hypertensive R thalamic/basal ganglia ICH. Resultant obstructive hydrocephalus prompting EVD placement 2/10. Subsequently, 2/10 she had acute mental status change to obtundation and respiratory arrest resulting in intubation.  Past Medical History:  HTN, medication noncompliance  Significant Hospital Events:  2/09 Admit for ICH 2/10 EVD placed, later intubated due to obtundation and respiratory arrest 2/11 MRI Brain, hypertonic saline d/c'd, EVD not working.  Husband did not consent to PICC  2/13 EVD removed 2/17 GOC discussion with family, PMT  Consults:  Stroke PCCM Neurosurgery  Procedures:  ETT 2/10 >> EVD 2/10 >> non functioning 2/11 at 1500; clamped 2/12 am >> 2/13  Significant Diagnostic Tests:   CT Head 2/9 >> Acute hemorrhage with the epicenter in the right thalamus. Measurement is approximately 3.2 x 2.1 x 2.5 cm (volume 8.8 cm^3). Intraventricular penetration with blood filling the third and fourth ventricles and nearly filling the right lateral ventricle. No hydrocephalus.  CTA Head/Neck 2/10 >> Unchanged size of intraparenchymal hemorrhage centered in the right thalamus and basal ganglia with extension into the right lateral ventricle. 2. No intracranial arterial occlusion or high-grade stenosis. No dissection, aneurysm or hemodynamically significant stenosis of the carotid or vertebral arteries.  CT Head 2/10 >> Stable parenchymal hemorrhage in the right thalamus and basal ganglia with intraventricular extension as described. No new focal abnormality is noted.  CT Head 2/10 (post intubation) >> redemonstrated intraparenchymal hemorrhage centered within the R thalamus/basal ganglia extending into the cerebral peduncle/midbrain with associated  intraventricular extension and hemorrhage in the R lateral ventricle and fourth ventricle, overall not significantly changed from prior; stable mass effect, 45m of R to L midline shift, postsurgical changes of ventriculostomy  TTE 2/10 >> LVEF 60 to 65%, LV has normal function, no regional wall motion abnormalities, mild concentric LVH.  RV systolic function is normal, normal pulmonary artery systolic pressure.   MRA head 2/11 >>Negative intracranial MR angiography. No evidence of aneurysm or high flow vascular malformation to explain the intraparenchymal hemorrhage   MRI brain 2/11 >> Similar intraparenchymal hemorrhage centered within the right thalamus and basal ganglia extending into the right midbrain with intraventricular extension, effacement of the prepontine cistern and similar 4 mm of leftward midline shift; separate areas of restricted diffusion and edema involving the R > L mesial temporal lobes/parahippocampal cortex, bilateral basal ganglia, and possibly the upper pons that are c/f acute infarcts, most likely secondary to mass effect from hemorrhage.  MRI brain 2/19 >> intraventricular clot has decreased in size but mid-brain parenchymal clot has not and there are more ischemic infarcts in the surrounding brain.  CT Head WO Contrast 09/07/20>> Expected evolution of intracranial hemorrhage since 08/30/2020 head CT. Multiple areas of low attenuation corresponding to some of the infarcts seen on prior MRI. Ventricle caliber similar to prior MRI   Micro Data:  2/9 COVID + Influenza negative 2/10 MRSA PCR negative 2/12 Tracheal aspirate >> 2/12 BCx 2 >>  Antimicrobials:  Cefepime 2/12 >> 2/15 Vanc 2/12 >> 2/15  Interim History / Subjective:  BP 134/91  T-max 99.8 overnight  Net + 11 L HGB 9.5 Na drop from 145- 139  Objective   Blood pressure 122/67, pulse (!) 105, temperature 99.8 F (37.7 C), temperature source Oral, resp. rate 14, height '5\' 5"'  (1.651 m),  weight 61 kg, last  menstrual period 03/03/2014, SpO2 98 %, unknown if currently breastfeeding.    Vent Mode: PRVC FiO2 (%):  [30 %] 30 % Set Rate:  [14 bmp] 14 bmp Vt Set:  [450 mL] 450 mL PEEP:  [5 cmH20] 5 cmH20 Plateau Pressure:  [10 cmH20-14 cmH20] 10 cmH20   Intake/Output Summary (Last 24 hours) at 09/08/2020 0827 Last data filed at 09/08/2020 0300 Gross per 24 hour  Intake 1849.93 ml  Output 1000 ml  Net 849.93 ml   Filed Weights   09/05/20 0500 09/06/20 0400 09/08/20 0442  Weight: 63.4 kg 65.5 kg 61 kg   Examination: General: Acute ill appearing middle aged adult female on mechanical ventilation, in NAD HEENT: ETT, MM pink/moist, significant left downward gaze, slight right downward  gaze Neuro: Continues to show signs of posturing, RASS -5 on no sedation, no purposeful movement  CV: s1s2 regular rate and rhythm, no murmur, rubs, or gallops,  PULM:  Coarse, few rhonchi to ascultation, no wheeze  no increased work of breathing, not assisting the vent GI: soft, bowel sounds active in all 4 quadrants, non-tender, non-distended, tolerating TF, Flexiseal with light brown stool Extremities: warm/dry, no edema , abnormal posturing bilaterally with  flexion on the right  Skin: no rashes or lesions, warm , dry and intact  Resolved Hospital Problem list   Hypernatremia   Assessment & Plan:  Multiple acute strokes  - Per neuro "scattered b/l hippocampus, posterior CC, bilateral thalamus, and upper pons, likely due to deep penetrating vessel compression by focal mass-effect" Intraparenchymal hemorrhage with right thalamic hemorrhage with extension into the third fourth and right lateral ventricle -Secondary to HTN and medication noncompliance  Obstructive hydrocephalus -S/P EVD placement 2/10, subsequently clamped and removed 2/13 P: Management per neurology  Maintain neuro protective measures; goal for eurothermia, euglycemia, eunatermia, normoxia, and PCO2 goal of 35-40 Neuro exam continues to  remain poor with signs of posturing Chance of meaninful neurologic recovery is slim   Nutrition and bowel regiment  Seizure precautions  Aspirations precautions  S.P 3% saline >> Trend Na post tx  Hypertensive emergency -Home medications include Coreg and HCTZ P:  SBP goal <160 Continue Norvasc, Coreg, abd HCTZ PRN IV antihypertensives  Closely monitor hemodynamics in the ICU setting   Acute hypoxemic respiratory failure secondary to CVA P: Continue ventilator support with lung protective strategies  Wean PEEP and FiO2 for sats greater than 90%. Head of bed elevated 30 degrees. Plateau pressures less than 30 cm H20.  Follow intermittent chest x-ray and ABG.   SAT/SBT as tolerated, mentation preclude extubation  Ensure adequate pulmonary hygiene  Follow cultures  VAP bundle in place  PAD protocol CXR 2/23  to evaluate for vascular congestion ( + 11 L)  Central fever  P: Fever trend improving  T-max 99.8 overnight  Supportive care  Buspirone started 2/20 Trend CBC and fever curve   Possible bacterial sinusitis -Extensive diffuse paranasal sinus opacification and moderate mastoid effusions in the setting of intubation. Tan secretions per ETT P: Currently on Ancef  Stop date in place   Anemia P: Stable  Trend CBC  Monitor for bleeding Transfuse per protocol     Daily Goals Checklist  Pain/Anxiety/Delirium protocol (if indicated): None Neuro vitals: every 4 hours AED's: None VAP protocol (if indicated): Bundle in place Respiratory support goals: Switch to pressure support.  Leave on pressure support as tolerated. Blood pressure target: 120-160.  DVT prophylaxis: Enoxaparin Nutrition Status: Tolerating tube feeds GI  prophylaxis: Protonix Fluid status goals: Allow Urinary catheter: External catheter Central lines: PIV only Glucose control: Well-controlled on correction scale Mobility/therapy needs: Bedrest Antibiotic de-escalation: cefazolin for 10 days.   Home medication reconciliation: On hold Daily labs: CBC, BMP twice weekly Code Status: Full Disposition: ICU.  Will likely need tracheostomy PEG tube if fmaily wants aggressive care   Goals of Care:  Last date of multidisciplinary goals of care discussion: 2/17 Family and staff present: Multiple family members, Wadie Lessen with PMT, Dr. Leonie Man, Dr. Cyndy Freeze, and Dr. Ander Slade Summary of discussion: Per chart review PMT met with family 2/17 with no decision reached  Follow up goals of care discussion due: Pending  Code Status: Full  Day 12 on vent. Family have been asked to decide goals of care. WD and comfort care vs Trach and PEG , long term care. There have been multiple family conferences . Difficult family dynamics as second marriage , with essentially 2 families. Will discuss with family 2/22 once nursing has discussion re: complaints by husband  to accreditation .  Per Nursing Manager 4N, husband feels he has heard different things from multiple providers  regarding patient's recovery/ prognosis. He is requesting a meeting with CCM and Neuro do discuss patient prognosis and see how consistent the message is from medical teams before making a decision about goals of care. [facility] Nursing staff is arranging for meeting 2/23. I will notify Dr. Lynetta Mare. Neuro will also be present, and Palliative care,  CRITICAL CARE Performed by: Magdalen Spatz  Total critical care time: 40 minutes  Critical care time was exclusive of separately billable procedures and treating other patients.  Critical care was necessary to treat or prevent imminent or life-threatening deterioration.  Critical care was time spent personally by me on the following activities: development of treatment plan with patient and/or surrogate as well as nursing, discussions with consultants, evaluation of patient's response to treatment, examination of patient, obtaining history from patient or surrogate, ordering and performing  treatments and interventions, ordering and review of laboratory studies, ordering and review of radiographic studies, pulse oximetry and re-evaluation of patient's condition.  Magdalen Spatz, AGACNP-BC [facility] Personal contact information can be found on Amion    09/08/2020, 8:27 AM

## 2020-09-08 NOTE — Progress Notes (Addendum)
STROKE TEAM PROGRESS NOTE   INTERVAL HISTORY Pt is evaluated at the bedside. No family is at the bedside. Pt still intubated on vent, posturing of BUEs (Lt>RT)and withdraw of BLEs on pain stimulation. No significant neuro changes. Eyes in dysconjugate position. Dolls eyes's absent. Corneal reflex weak B/L. Gag and cough present. Mild ventilatory effort present. BP has been labile. Systolic between 99-357 mmHg and diastolic between 01-779 mmHg . Last temp was 99.8 at 400. BCId with 1/2 blood culture staph epi (mecA resistance), Could be contaminant. CCM following.   Vitals:   09/08/20 0400 09/08/20 0442 09/08/20 0500 09/08/20 0600  BP: (!) 140/95  97/73 122/67  Pulse: (!) 116  (!) 109 (!) 105  Resp: (!) 22  14 14   Temp: 99.8 F (37.7 C)     TempSrc: Oral     SpO2: 99%  98% 98%  Weight:  61 kg    Height:       CBC:  Recent Labs  Lab 09/03/20 0944 09/05/20 0457 09/07/20 0452  WBC 11.4* 12.1* 10.3  NEUTROABS 9.0*  --   --   HGB 7.4* 7.1* 9.5*  HCT 22.7* 22.1* 29.9*  MCV 76.4* 77.3* 79.1*  PLT 207 280 360   Basic Metabolic Panel:  Recent Labs  Lab 09/05/20 0457 09/07/20 0452  NA 145 139  K 3.5 4.0  CL 100 98  CO2 31 30  GLUCOSE 152* 138*  BUN 33* 21*  CREATININE 0.93 0.90  CALCIUM 9.0 8.9   Lipid Panel:  Recent Labs  Lab 09/03/20 0353  TRIG 119   HgbA1c:  No results for input(s): HGBA1C in the last 168 hours. Urine Drug Screen:  No results for input(s): LABOPIA, COCAINSCRNUR, LABBENZ, AMPHETMU, THCU, LABBARB in the last 168 hours.  Alcohol Level  No results for input(s): ETH in the last 168 hours.  IMAGING past 24 hours CT HEAD WO CONTRAST  Result Date: 09/07/2020 CLINICAL DATA:  Intracranial hemorrhage and infarcts, follow-up EXAM: CT HEAD WITHOUT CONTRAST TECHNIQUE: Contiguous axial images were obtained from the base of the skull through the vertex without intravenous contrast. COMPARISON:  08/30/2020, MRI 09/05/2020 FINDINGS: Brain: Expected evolution of  intracranial hemorrhage involving the right thalamus, right basal ganglia, and adjacent white matter. Small volume residual hemorrhage along prior right frontal ventricular catheter. Decreased intraventricular hemorrhage. Ventricle caliber similar to prior MRI. Persistent minor leftward midline shift. Persistent sulcal effacement. Multiple areas of low-attenuation are identified in the central white matter and dorsal brainstem corresponding to some of the infarcts on prior MRI. Vascular: No new findings. Skull: Right frontal burr hole.  Otherwise unremarkable. Sinuses/Orbits: Nonspecific diffuse paranasal sinus opacification. Left greater than right mastoid and partial right middle ear opacification. Orbits are unremarkable. Other: None. IMPRESSION: Expected evolution of intracranial hemorrhage since 08/30/2020 head CT. Multiple areas of low attenuation corresponding to some of the infarcts seen on prior MRI. Ventricle caliber similar to prior MRI. Electronically Signed   By: 09/01/2020 M.D.   On: 09/07/2020 10:46    PHYSICAL EXAM  Temp:  [97.6 F (36.4 C)-99.8 F (37.7 C)] 99.8 F (37.7 C) (02/22 0400) Pulse Rate:  [47-118] 105 (02/22 0600) Resp:  [14-27] 14 (02/22 0600) BP: (97-207)/(67-121) 122/67 (02/22 0600) SpO2:  [97 %-100 %] 98 % (02/22 0600) FiO2 (%):  [30 %] 30 % (02/22 0308) Weight:  [61 kg] 61 kg (02/22 0442)  General - Well nourished, well developed, intubated off sedation.  Ophthalmologic - fundi not visualized due to noncooperation.  Cardiovascular -  Regular rhythm and tachycardia.  Neuro - intubated off sedation, eyes closed, not following commands. With forced eye opening, left eye in downward position, right eye mid position, left pupil 1.2mm, right 78mm, not reactive to light, doll's eyes absent, not tracking. Corneal reflex weakly present bilaterally, gag and cough present. Some breathing effort present.  Facial symmetry not able to test due to ET tube.  Tongue protrusion  not cooperative. On pain stimulation, BUE extension posturing (Lt >Rt), BLE mild withdraw. Bilateral positive babinski. Sensation, coordination and gait not tested.   ASSESSMENT/PLAN  Charlene [name] is a 55 y.o. female with history of hypertension with difficulty maintaining blood pressures and noncompliance to medications for various reasons, presented to the emergency room at [facility] for left-sided weakness and headaches.  She was appear to be confused and lethargic. She was seen by telemedicine evaluation by neurologist for concern for stroke and a noncontrast head CT showed a right thalamic/basal ganglia hemorrhage 3.2 x 2.1 x 2.5  with intraventricular extension.  He was transferred ED to ED for inpatient neurological evaluation and admission. BP high at  178/109 mmHg on Admission. CTA head & neck showed No intracranial large vessel occlusion or proximal high-grade arterial stenosis. 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 Repeat CT Head on 08/27/20 showed- Stable parenchymal hemorrhage in the right thalamus and basal ganglia with intraventricular extension. ECHO LVEF 60-65%. mild concentric left ventricular hypertrophy. HgbA1c 6.0. LDL 167. Repeat CT head (08/27/20 )showed Stable parenchymal hemorrhage in the right thalamus and basal ganglia with intraventricular extension. S/P ventricular catheter placement on 08/27/2020. Following that Pt had labored breathing so intubated. CT head repeat post procedure didn't demonstrate any increase in size of hemorrhage.  3% normal saline was stopped. PT recommends SNF. MRA normal, MRI showed scattered b/l hippocampus, posterior CC, bilateral thalamus, and upper pons. Pt is on Vanco and Cefipime due to fever but stopped after negative cultures. Cultures negative until now. Goals of care discussed with family by Attending  and palliative care. Family meeting held on 09/03/20. Palliative care and CCM on  board. Cefazolin for sinusitis started on 09/05/20 . Repeat CT showed Expected evolution of intracranial hemorrhage.  Multiple areas of low attenuation corresponding to some of the infarcts seen on prior MRI.  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   CT Head -  Right thalamic ICH with IVH in the third and fourth ventricles and nearly filling the right lateral ventricle. No hydrocephalus.   CTA head & neck - No intracranial large vessel occlusion or proximal high-grade arterial stenosis. 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   Repeat CT Head x 3 - Stable parenchymal hemorrhage in the right thalamus and basal ganglia with intraventricular extension as described.   2D ECHO LVEF 60-65%. mild concentric left ventricular hypertrophy  Repeat CT 09/07/20- Expected evolution of intracranial hemorrhage.  Multiple areas of low             attenuation corresponding to some of the infarcts seen on prior MRI.  LDL 167  HgbA1c 6.0  Vitamin B12 301  VTE prophylaxis -  Lovenox  No antithrombotic prior to admission, now on No antithrombotic due to ICH.   Therapy recommendations: SNF  Disposition: husband is the POA, leaning toward trach and PEG  Slight obstructive hydrocephalus  CT Head -  IVH in the third and fourth  ventricles and nearly filling the right lateral ventricle.  Ventricle size slightly larger than her baseline CT in 04/2020  Repeat CT head x 3 stable ventricle size.   NSG on board  S/P ventricular catheter placement on 08/27/2020. Neurosurgery clamped EVD 08/29/20 and dc on       08/30/20  Minimal drainage  Intracranial pressure not high  Stroke - scattered b/l hippocampus, posterior CC, bilateral thalamus, and upper pons - likely due to deep penetrating vessel compression by focal mass-effect  MRI brain 2/11 right greater than left mesial temporal lobes / parahippocampal cortex,  bilateral basal ganglia, and possibly the upper pons that are concerning for acute infarcts, most likely secondary to mass effect from the hemorrhage.  MRA Normal  EEG No seizures or epileptiform discharges  Off 3% saline   MRI repeat Acute to early subacute infarcts in the bilateral cerebral white matter and right           cerebellum, new from 08/28/2020. Interval evolution of multiple other infarcts present on the prior MRI. 3. Unchanged size of large hemorrhage centered in the right thalamus with extension into the midbrain. Unchanged 6 mm of leftward midline shift.  Hypertensive emergency hypotension  Home meds: Coreg 6.25 mg and Hydrochlorothiazide 25 mg daily (Non complaint)  Unstable  . BP goal SP <160 mmHG  . Off Claviprex now . Long-term BP goal normotensive . Continue Coreg 12.5 and Hydrochlorothiazide 25 mg Daily . Continue amlodipine 5  mg .  Hyperlipidemia  Home meds: None   LDL 167, goal < 70  Hold starting statin due to acute ICH  May consider statin at discharge  AKI  Cre 1.09 -> 1.94>1.09>1.04>1.07->0.93>0.90  Continue IV fluid  Foley placed on 08/28/20  BMP monitoring  Anemia   Hb 7.4->7.1>PRBC->9.5  S/p PRBC 2/20  CCM on board  CBC monitoring  Monitor and transfuse per protocol.  Fever, could be central fever  ? Sinusitis   Vancomycin and Cefipime -> Ancef (09/05/20)  Temp 101.1->100.3->100.6->100.8>99.7>99.8  WBC 13.0 >10.1>11.4->12.1>10.3  Bld Cultures -1/2 bottles growing staph epi (mecA resistance), could be contaminant.   UA neg  CXR neg  Buspar started 2/20  Other Stroke Risk Factors  Noncompliance   Hospital day #12  ATTENDING NOTE: I reviewed above note and agree with the assessment and plan. Pt was seen and examined.   No neuro changes, still has apnea without vent support. Afebrile today but still tachycardia with labile BP. I had long discussion with father at bedside, updated pt current condition, treatment  plan and potential poor prognosis, and answered all the questions. I also updated husband yesterday over the phone. They requested family meeting tomorrow 11am for again GOC discussion.   For detailed assessment and plan, please refer to above as I have made changes wherever appropriate.   Marvel Plan, MD PhD Stroke Neurology 09/08/2020 10:30 PM  This patient is critically ill due to significant thalamic ICH with brainstem extension, IVH, b/l strokes and at significant risk of neurological worsening, death form cerebral edema, brain herniation, pneumonia, hydrocephalus, seizure. This patient's care requires constant monitoring of vital signs, hemodynamics, respiratory and cardiac monitoring, review of multiple databases, neurological assessment, discussion with family, other specialists and medical decision making of high complexity. I spent 30 minutes of neurocritical care time in the care of this patient.     To contact Stroke Continuity provider, please refer to [email or web address]. After hours, contact General Neurology

## 2020-09-09 ENCOUNTER — Inpatient Hospital Stay (HOSPITAL_COMMUNITY): Payer: Medicaid Other

## 2020-09-09 DIAGNOSIS — I639 Cerebral infarction, unspecified: Secondary | ICD-10-CM

## 2020-09-09 LAB — CBC
HCT: 28.7 % — ABNORMAL LOW (ref 36.0–46.0)
Hemoglobin: 9.5 g/dL — ABNORMAL LOW (ref 12.0–15.0)
MCH: 25.8 pg — ABNORMAL LOW (ref 26.0–34.0)
MCHC: 33.1 g/dL (ref 30.0–36.0)
MCV: 78 fL — ABNORMAL LOW (ref 80.0–100.0)
Platelets: 498 10*3/uL — ABNORMAL HIGH (ref 150–400)
RBC: 3.68 MIL/uL — ABNORMAL LOW (ref 3.87–5.11)
RDW: 16.3 % — ABNORMAL HIGH (ref 11.5–15.5)
WBC: 7.3 10*3/uL (ref 4.0–10.5)
nRBC: 0 % (ref 0.0–0.2)

## 2020-09-09 LAB — GLUCOSE, CAPILLARY
Glucose-Capillary: 115 mg/dL — ABNORMAL HIGH (ref 70–99)
Glucose-Capillary: 137 mg/dL — ABNORMAL HIGH (ref 70–99)
Glucose-Capillary: 140 mg/dL — ABNORMAL HIGH (ref 70–99)
Glucose-Capillary: 146 mg/dL — ABNORMAL HIGH (ref 70–99)
Glucose-Capillary: 148 mg/dL — ABNORMAL HIGH (ref 70–99)
Glucose-Capillary: 150 mg/dL — ABNORMAL HIGH (ref 70–99)
Glucose-Capillary: 159 mg/dL — ABNORMAL HIGH (ref 70–99)

## 2020-09-09 LAB — BASIC METABOLIC PANEL
Anion gap: 12 (ref 5–15)
BUN: 31 mg/dL — ABNORMAL HIGH (ref 6–20)
CO2: 33 mmol/L — ABNORMAL HIGH (ref 22–32)
Calcium: 9.1 mg/dL (ref 8.9–10.3)
Chloride: 95 mmol/L — ABNORMAL LOW (ref 98–111)
Creatinine, Ser: 0.97 mg/dL (ref 0.44–1.00)
GFR, Estimated: >60 mL/min (ref >60)
Glucose, Bld: 147 mg/dL — ABNORMAL HIGH (ref 70–99)
Potassium: 3.8 mmol/L (ref 3.5–5.1)
Sodium: 140 mmol/L (ref 135–145)

## 2020-09-09 LAB — MAGNESIUM: Magnesium: 2.4 mg/dL (ref 1.7–2.4)

## 2020-09-09 IMAGING — DX DG CHEST 1V PORT
1 series · 1 of 1 positions shown · non-contrast
Comparison: Chest x-ray [DATE].

CLINICAL DATA: Pulmonary vascular congestion.

EXAM:
PORTABLE CHEST 1 VIEW

[chest]
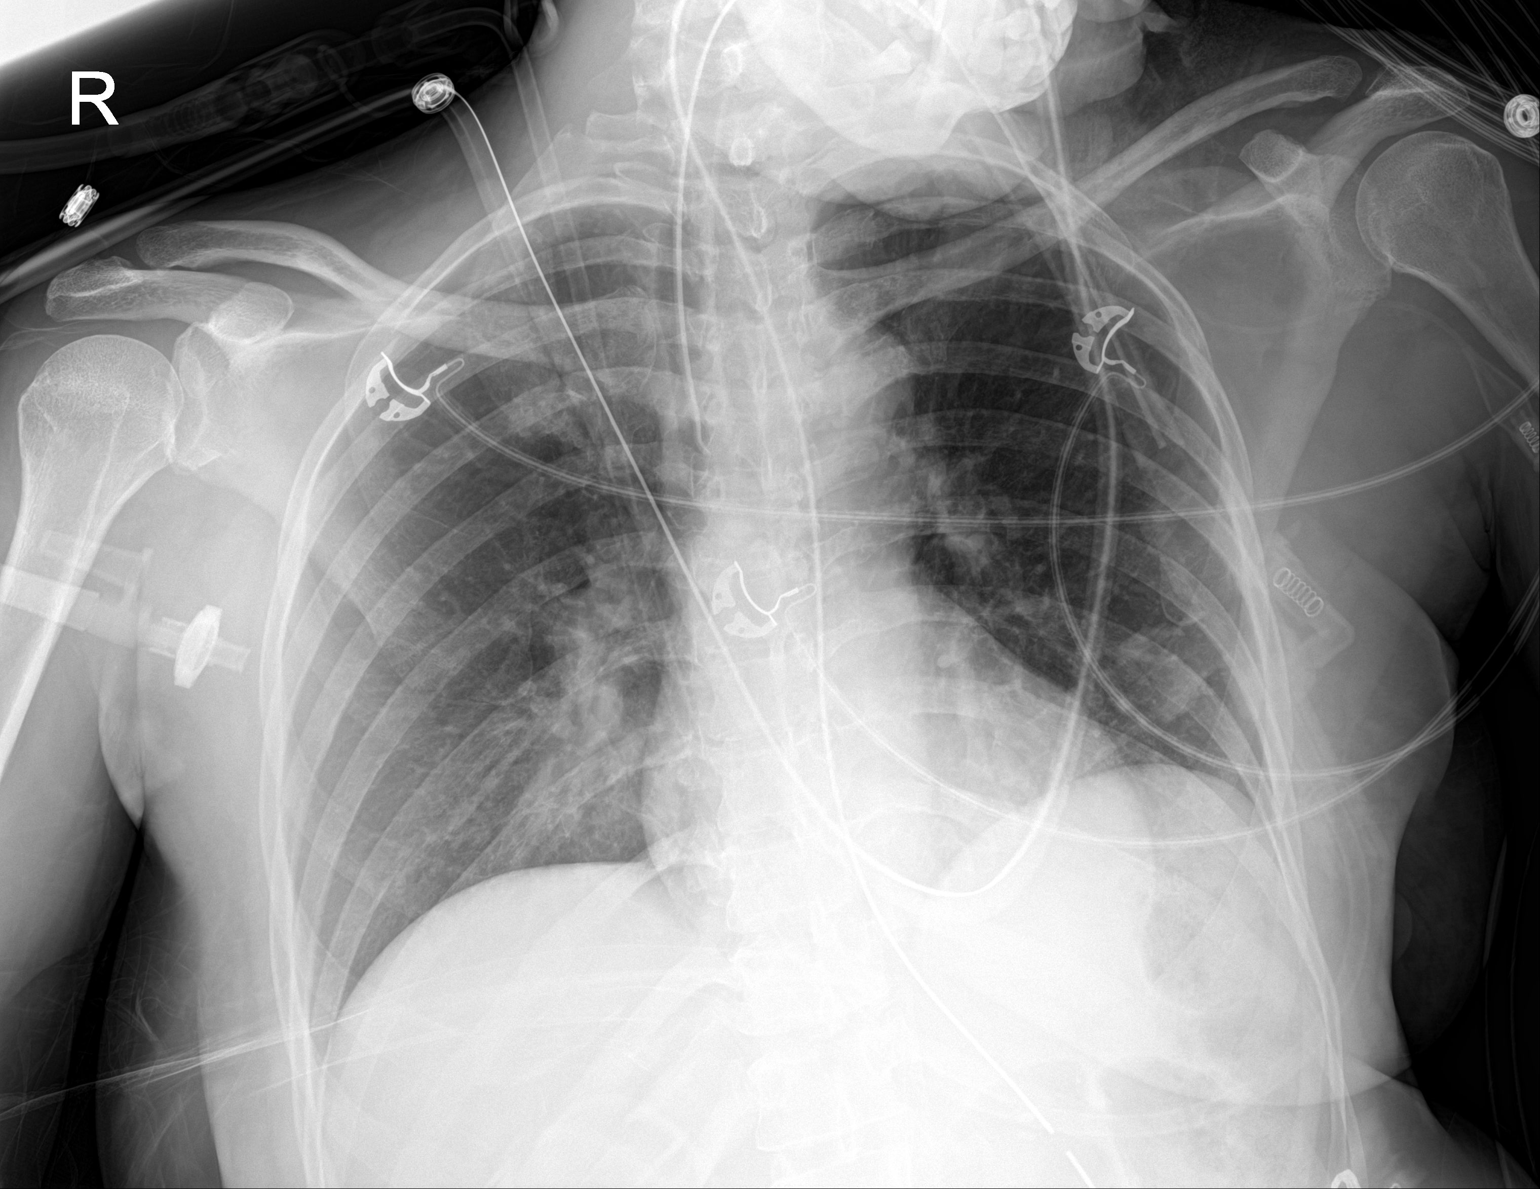

[1 of 1 positions shown; findings below may reference images not displayed]

FINDINGS: Endotracheal tube and NG tube in stable position. Heart size normal.
Pulmonary vascularity normal. Mild right scratched it low lung
volumes with bibasilar atelectasis. Mild right base infiltrate
cannot be excluded. No pleural effusion or pneumothorax.
IMPRESSION: 1. Lines and tubes in stable position.
2. Low lung volumes with bibasilar atelectasis. Mild right base
infiltrate cannot be excluded.

## 2020-09-09 MED ORDER — POLYETHYLENE GLYCOL 3350 17 G PO PACK: 17.0000 g | PACK | Freq: Every day | ORAL | Status: DC | PRN

## 2020-09-09 MED ORDER — MIDAZOLAM HCL 2 MG/2ML IJ SOLN
5.0000 mg | Freq: Once | INTRAMUSCULAR | Status: AC
  Administered 2020-09-10: 5 mg via INTRAVENOUS
  Filled 2020-09-09: qty 6

## 2020-09-09 MED ORDER — SENNOSIDES-DOCUSATE SODIUM 8.6-50 MG PO TABS
1.0000 | ORAL_TABLET | Freq: Two times a day (BID) | ORAL | Status: DC | PRN
  Administered 2020-09-19 – 2020-12-17 (×5): 1
  Filled 2020-09-09 (×6): qty 1

## 2020-09-09 MED ORDER — FENTANYL CITRATE (PF) 100 MCG/2ML IJ SOLN
200.0000 ug | Freq: Once | INTRAMUSCULAR | Status: AC
  Administered 2020-09-10: 200 ug via INTRAVENOUS
  Filled 2020-09-09: qty 4

## 2020-09-09 MED ORDER — VECURONIUM BROMIDE 10 MG IV SOLR
10.0000 mg | Freq: Once | INTRAVENOUS | Status: AC
  Administered 2020-09-10: 10 mg via INTRAVENOUS
  Filled 2020-09-09: qty 10

## 2020-09-09 NOTE — Progress Notes (Signed)
NAME:  Charlene [name], MRN:  [id number], DOB:  30-Oct-1965, LOS: 44 ADMISSION DATE:  08/26/2020, CONSULTATION DATE:  2/10 REFERRING MD:  Dr. Erlinda Hong, CHIEF COMPLAINT:  ICH   Brief History:  55 year old female admitted with hypertensive R thalamic/basal ganglia ICH. Resultant obstructive hydrocephalus prompting EVD placement 2/10. Subsequently, 2/10 she had acute mental status change to obtundation and respiratory arrest resulting in intubation.  Past Medical History:  HTN, medication noncompliance  Significant Hospital Events:  2/09 Admit for ICH 2/10 EVD placed, later intubated due to obtundation and respiratory arrest 2/11 MRI Brain, hypertonic saline d/c'd, EVD not working.  Husband did not consent to PICC  2/13 EVD removed 2/17 GOC discussion with family, PMT  Consults:  Stroke PCCM Neurosurgery  Procedures:  ETT 2/10 >> EVD 2/10 > non functioning 2/11 at 1500; clamped 2/12 am > 2/13  Significant Diagnostic Tests:  CT Head 2/9 >> Acute hemorrhage with the epicenter in the right thalamus. Measurement is approximately 3.2 x 2.1 x 2.5 cm (volume 8.8 cm^3). Intraventricular penetration with blood filling the third and fourth ventricles and nearly filling the right lateral ventricle. No hydrocephalus. CTA Head/Neck 2/10 >> Unchanged size of intraparenchymal hemorrhage centered in the right thalamus and basal ganglia with extension into the right lateral ventricle. 2. No intracranial arterial occlusion or high-grade stenosis. No dissection, aneurysm or hemodynamically significant stenosis of the carotid or vertebral arteries. CT Head 2/10 >> Stable parenchymal hemorrhage in the right thalamus and basal ganglia with intraventricular extension as described. No new focal abnormality is noted. CT Head 2/10 (post intubation) >> redemonstrated intraparenchymal hemorrhage centered within the R thalamus/basal ganglia extending into the cerebral peduncle/midbrain with associated intraventricular  extension and hemorrhage in the R lateral ventricle and fourth ventricle, overall not significantly changed from prior; stable mass effect, 36mm of R to L midline shift, postsurgical changes of ventriculostomy TTE 2/10 >> LVEF 60 to 65%, LV has normal function, no regional wall motion abnormalities, mild concentric LVH.  RV systolic function is normal, normal pulmonary artery systolic pressure.  MRA head 2/11 >>Negative intracranial MR angiography. No evidence of aneurysm or high flow vascular malformation to explain the intraparenchymal hemorrhage  MRI brain 2/11 >> Similar intraparenchymal hemorrhage centered within the right thalamus and basal ganglia extending into the right midbrain with intraventricular extension, effacement of the prepontine cistern and similar 4 mm of leftward midline shift; separate areas of restricted diffusion and edema involving the R > L mesial temporal lobes/parahippocampal cortex, bilateral basal ganglia, and possibly the upper pons that are c/f acute infarcts, most likely secondary to mass effect from hemorrhage. MRI brain 2/19 >> intraventricular clot has decreased in size but mid-brain parenchymal clot has not and there are more ischemic infarcts in the surrounding brain.  CT Head WO Contrast 09/07/20>> Expected evolution of intracranial hemorrhage since 08/30/2020 head CT. Multiple areas of low attenuation corresponding to some of the infarcts seen on prior MRI. Ventricle caliber similar to prior MRI   Micro Data:  2/9 COVID + Influenza negative 2/10 MRSA PCR negative 2/12 BCx 2 > no growth  2/19 BC 1/2 +Stap EPI, 1/2 no growth   Antimicrobials:  Cefepime 2/12 > 2/15 Vanc 2/12 > 2/15  Interim History / Subjective:  Tmax 99.5  Objective   Blood pressure (!) 150/95, pulse (!) 105, temperature 98.5 F (36.9 C), temperature source Axillary, resp. rate (!) 21, height $RemoveBe'5\' 5"'NchUrkjjP$  (1.651 m), weight 61 kg, last menstrual period 03/03/2014, SpO2 100 %, unknown if currently  breastfeeding.    Vent Mode: PRVC FiO2 (%):  [30 %] 30 % Set Rate:  [14 bmp] 14 bmp Vt Set:  [450 mL] 450 mL PEEP:  [5 cmH20] 5 cmH20 Plateau Pressure:  [9 cmH20-14 cmH20] 9 cmH20   Intake/Output Summary (Last 24 hours) at 09/09/2020 0831 Last data filed at 09/09/2020 0600 Gross per 24 hour  Intake 1803.54 ml  Output 1450 ml  Net 353.54 ml   Filed Weights   09/05/20 0500 09/06/20 0400 09/08/20 0442  Weight: 63.4 kg 65.5 kg 61 kg   Examination: General: adult female, on vent  HEENT: ETT/OG in place Neuro: severe left down-ward gaze, right mid position, pupil 2 mm on right and sluggish, unable to assess left, LLE withdrawals, posturing noted to RLE, RUE, no movement noted to RUE, +gag/cough CV: RRR, no MRG PULM:  Coarse breath sounds, no use of accessory muscles  GI: distended, soft, active bowel sounds  Extremities: -edema  Skin: dry/intact   Resolved Hospital Problem list   Hypernatremia   Assessment & Plan:  Multiple acute strokes  - Per neuro "scattered b/l hippocampus, posterior CC, bilateral thalamus, and upper pons, likely due to deep penetrating vessel compression by focal mass-effect" Intraparenchymal hemorrhage with right thalamic hemorrhage with extension into the third fourth and right lateral ventricle -Secondary to HTN and medication noncompliance  Obstructive hydrocephalus -S/P EVD placement 2/10, subsequently clamped and removed 2/13 -S/P 3% P: Management per neurology  Maintain neuro protective measures; goal for eurothermia, euglycemia, eunatermia, normoxia, and PCO2 goal of 35-40 Nutrition and bowel regiment  Seizure precautions  Aspirations precautions   Hypertensive emergency -Home medications include Coreg and HCTZ P:  SBP goal <160 Continue Norvasc, Coreg, HCTZ PRN IV antihypertensives  Closely monitor hemodynamics in the ICU setting   Acute hypoxemic respiratory failure secondary to CVA P: Continue ventilator support with lung protective  strategies  Wean PEEP and FiO2 for sats greater than 90%. Head of bed elevated 30 degrees. Plateau pressures less than 30 cm H20.  Follow intermittent chest x-ray and ABG.   SAT/SBT as tolerated, mentation preclude extubation  Ensure adequate pulmonary hygiene  VAP bundle in place   Central fever  P: Buspirone started 2/20 Trend CBC and fever curve   Possible bacterial sinusitis -Extensive diffuse paranasal sinus opacification and moderate mastoid effusions in the setting of intubation. Tan secretions per ETT P: Currently on Ancef (Stop date in place)   Anemia P: Trend CBC  Monitor for bleeding Transfuse per protocol     Daily Goals Checklist  Pain/Anxiety/Delirium protocol (if indicated): None Neuro vitals: every 4 hours VAP protocol (if indicated): Bundle in place Respiratory support goals: pressure support as tolerated. DVT prophylaxis: Enoxaparin Nutrition Status: Tolerating tube feeds GI prophylaxis: Protonix Urinary catheter: External catheter Mobility/therapy needs: Bedrest Daily labs: CBC, BMP twice weekly Code Status: Full Disposition: ICU.  Will likely need tracheostomy PEG tube if fmaily wants aggressive care. Meeting planned for today.   Goals of Care:  Last date of multidisciplinary goals of care discussion: 2/17 Family and staff present: Multiple family members, Wadie Lessen with PMT, Dr. Leonie Man, Dr. Cyndy Freeze, and Dr. Ander Slade Summary of discussion: Per chart review PMT met with family 2/17 with no decision reached  Follow up goals of care discussion due: 2/2 Code Status: Full  Day 12 on vent. Family have been asked to decide goals of care. WD and comfort care vs Trach and PEG , long term care. There have been multiple family conferences . Difficult family  dynamics as second marriage , with essentially 2 families. Will discuss with family 2/22 once nursing has discussion re: complaints by husband  to accreditation .  Per Previous EMR documentation "Nursing  Manager 4N reports, husband feels he has heard different things from multiple providers  regarding patient's recovery/ prognosis. He is requesting a meeting with CCM and Neuro do discuss patient prognosis and see how consistent the message is from medical teams before making a decision about goals of care" >> Meeting planned for today 2/23.   CRITICAL CARE Performed by: Omar Person  Total critical care time: 32 minutes  Critical care time was exclusive of separately billable procedures and treating other patients.  Critical care was necessary to treat or prevent imminent or life-threatening deterioration.  Critical care was time spent personally by me on the following activities: development of treatment plan with patient and/or surrogate as well as nursing, discussions with consultants, evaluation of patient's response to treatment, examination of patient, obtaining history from patient or surrogate, ordering and performing treatments and interventions, ordering and review of laboratory studies, ordering and review of radiographic studies, pulse oximetry and re-evaluation of patient's condition.  Hayden Pedro, AGACNP-BC [facility]  PCCM Pgr: [phone]

## 2020-09-09 NOTE — TOC Initial Note (Addendum)
Transition of Care [facility]) - Initial/Assessment Note    Patient Details  Name: Charlene [name] MRN: [id number] Date of Birth: 1965-09-01  Transition of Care [facility]) CM/SW Contact:    Glennon Mac, RN Phone Number: 09/09/2020,12:30  Clinical Narrative:   55 year old female admitted with hypertensive R thalamic/basal ganglia ICH. Resultant obstructive hydrocephalus prompting EVD placement 2/10. Subsequently, 2/10 she had acute mental status change to obtundation and respiratory arrest resulting in intubation. Patient with poor neurological prognosis, yet family wishes to continue with full support, tracheostomy and PEG placement.  Called into family meeting this morning to assist with explaining process for acute to acute hospital transfer.  Family desiring patient transfer to [facility] for second opinion; Dr. Pearlean Brownie called [facility] transfer center on 09/03/2020 and was told that facility was not entertaining any outside transfers due to being at capacity, and in red zone.  I explained the process of transferring patients to another hospital, and answered all questions to the best my ability.  Patient's daughter actually had [facility] transfer center on the phone, and she asked if I would speak with them.  I spoke with [name] at [facility], who explained that facility is currently at capacity for admissions and in red zone.  [facility] is currently not accepting patients based on family's request or for second opinions.  I gave family this information, and they still feel like they are being given conflicting information.  They request that either Dr. [name] or Dr. Roda Shutters call [facility] again to request a possible second opinion.  Dr. [name] has agreed to do this.  I did explain to family that if patient is unable to wean from the ventilator, that placement may be difficult.  They they were made aware that due to patient's uninsured status with pending Medicaid, patient/family may have to apply for  [facility] and then transition to ventilator SNF in [place].  I did assure them that [facility] case manager/social worker would make every attempt to place patient in [place] prior to considering transfer to [place] facility.  Family verbalized understanding of this process.  They expressed wish to speak with financial counselor to determine where they are with Medicaid application.  I have left message with [facility] team lead Joaine McKeel to discuss this.  Will follow up with family as information is available.                 Expected Discharge Plan: Skilled Nursing Facility Barriers to Discharge: Continued Medical Work up        Expected Discharge Plan and Services Expected Discharge Plan: Skilled Nursing Facility   Discharge Planning Services: CM Consult   Living arrangements for the past 2 months: Single Family Home                                      Prior Living Arrangements/Services Living arrangements for the past 2 months: Single Family Home Lives with:: Spouse Patient language and need for interpreter reviewed:: Yes              Criminal Activity/Legal Involvement Pertinent to Current Situation/Hospitalization: No - Comment as needed  Activities of Daily Living   ADL Screening (condition at time of admission) Patient's cognitive ability adequate to safely complete daily activities?: No Is the patient deaf or have difficulty hearing?: No Does the patient have difficulty seeing, even when wearing glasses/contacts?: No Does  the patient have difficulty concentrating, remembering, or making decisions?: Yes Patient able to express need for assistance with ADLs?: No Does the patient have difficulty dressing or bathing?: Yes Independently performs ADLs?: No Communication: Dependent Is this a change from baseline?: Change from baseline, expected to last >3 days Dressing (OT): Dependent Is this a change from baseline?: Change from baseline, expected to  last >3 days Grooming: Dependent Is this a change from baseline?: Change from baseline, expected to last >3 days Feeding: Dependent Is this a change from baseline?: Change from baseline, expected to last >3 days Bathing: Dependent Is this a change from baseline?: Change from baseline, expected to last >3 days Toileting: Dependent Is this a change from baseline?: Change from baseline, expected to last >3days In/Out Bed: Dependent Is this a change from baseline?: Change from baseline, expected to last >3 days Walks in Home: Dependent Is this a change from baseline?: Change from baseline, expected to last >3 days Does the patient have difficulty walking or climbing stairs?: Yes Weakness of Legs: Both Weakness of Arms/Hands: Left  Permission Sought/Granted                  Emotional Assessment Appearance:: Appears stated age Attitude/Demeanor/Rapport: Unable to Assess Affect (typically observed): Unable to Assess        Admission diagnosis:  ICH (intracerebral hemorrhage) (HCC) [I61.9] Intraparenchymal hemorrhage of brain [facility]) [I61.9] Patient Active Problem List   Diagnosis Date Noted  . Fever   . ICH (intracerebral hemorrhage) (HCC) 08/27/2020  . Intraparenchymal hemorrhage of brain (HCC)   . Acute hypoxemic respiratory failure (HCC)   . Anxiety state, unspecified 03/11/2014  . Essential hypertension 03/11/2014  . Essential hypertension, benign 02/28/2014  . Accelerated hypertension 02/21/2014  . Nausea & vomiting 02/21/2014  . Headache 02/21/2014  . Chest pain 02/21/2014   PCP:  Patient, [name] Pharmacy:   [id number] [place], [place] - [address] [place] [address] Phone: [phone] Fax: [phone]     Social Determinants of Health (SDOH) Interventions    Readmission Risk Interventions No flowsheet data found. Quintella Baton, RN, BSN  Trauma/Neuro ICU Case Manager [phone]

## 2020-09-09 NOTE — Progress Notes (Signed)
Patient ID: Charlene [name], female   DOB: 06-22-1966, 55 y.o.   MRN: [id number]  Medical records reviewed   55 year old female with past medical history as below, which is significant for uncontrolled  hypertension and reported medication noncompliance.  Presented to the emergency department at [facility] 2/9 with c/o headache and left-sided weakness.  STAT CT Head demonstrated right thalamic/basal ganglia hemorrhage with intraventricular extension.  She was transferred to [facility] emergency department for in person neurology evaluation. There was evidence of obstructive hydrocephalus and neurosurgery was consulted. External ventricular drain was placed 2/10. In the hours following she became increasingly obtunded. PCCM was consulted. Upon PCCM arrival the patient became apneic and was intubated. EVD removed 2/13     Family meeting held 09-03-20   Today is day  68 of this hospitalization.  Patient remains intubated/respiratory failure, with extensor posturing, unresponsive to gentle touch and verbal stimuli.  Likely long term poor prognosis for meaningful recovery.  Case discussed with medical team   Met with large family as scheduled for medical update hoping to help family with deeper understanding of the medical situation and limitations of medical interventions in this unfortunate situation to improve quality of life for Charlene [name].   Family present included her husband/Charlene [name], parents/Charlene and Charlene [name], daughter/Charlene [name] Armandina Gemma,  Dr Erlinda Hong, Dr Cyndy Freeze and Dr Joelene Millin present to update on  patient's current medical situation. Information and education  regarding diagnosis, prognosis, treatment options/ decisions and anticipatory care needs  discussed.   Questions and concerns addressed in detail.  Eduction regarding the difference between a continued aggressive medical intervention path and a more palliative comfort path was offered.    Education offered on transition of care  process  Decision made to proceed with Trach and Peg.  Family have strong faith and base their hope for imrpovement in prayer.    Of note:   Charlotte Crumb Abdelrahman/pateitn's father  is now designated hospital visitor.   However Charlene [name]/Husband remains legal decision maker at this time until if and when documentation supports differently.  Emotional support and therapeutic listening.  Total time spent on the unit was 35 minutes   PMT will continue to support holistically  Greater than 50% of the time was spent in counseling and coordination of care   This nurse practitioner informed  the amily and the attending that I will be out of the hospital until Monday morning.  If the patient is still hospitalized I will follow-up at that time.  Call palliative medicine team phone # [phone] with questions or concerns.  Wadie Lessen NP  Palliative Medicine Team Team Phone # [phone] Pager [phone]

## 2020-09-09 NOTE — Progress Notes (Addendum)
STROKE TEAM PROGRESS NOTE   INTERVAL HISTORY Pt is evaluated at the bedside. No family is at the bedside. Pt still intubated on vent, posturing of BUEs (Lt>RT)and withdraw of BLEs on pain stimulation. No significant neuro changes. Eyes in dysconjugate position. Dolls eyes's absent. Corneal reflex weak B/L. Gag and cough present. No ventilatory effort present. Tried to wean off ventilator, Pt's RR dropped.  BP Systolic between 71-062 mmHg and diastolic between 69-485 mmHg . Creatinine 0.97, wbc 7.3, Hb stable at 9.5. Pt is Afebrile. Pt on Ancef for sinusitis. Family meeting planned for today at 11 am.   Vitals:   09/09/20 0500 09/09/20 0600 09/09/20 0752 09/09/20 0800  BP: 114/73 (!) 150/95 102/69   Pulse: 100 (!) 105 99   Resp: 15 (!) 21 19   Temp:    98 F (36.7 C)  TempSrc:    Axillary  SpO2: 100% 100% 98%   Weight:      Height:       CBC:  Recent Labs  Lab 09/03/20 0944 09/05/20 0457 09/07/20 0452 09/09/20 0438  WBC 11.4*   < > 10.3 7.3  NEUTROABS 9.0*  --   --   --   HGB 7.4*   < > 9.5* 9.5*  HCT 22.7*   < > 29.9* 28.7*  MCV 76.4*   < > 79.1* 78.0*  PLT 207   < > 360 498*   < > = values in this interval not displayed.   Basic Metabolic Panel:  Recent Labs  Lab 09/07/20 0452 09/09/20 0438  NA 139 140  K 4.0 3.8  CL 98 95*  CO2 30 33*  GLUCOSE 138* 147*  BUN 21* 31*  CREATININE 0.90 0.97  CALCIUM 8.9 9.1  MG  --  2.4   Lipid Panel:  Recent Labs  Lab 09/03/20 0353  TRIG 119   HgbA1c:  No results for input(s): HGBA1C in the last 168 hours. Urine Drug Screen:  No results for input(s): LABOPIA, COCAINSCRNUR, LABBENZ, AMPHETMU, THCU, LABBARB in the last 168 hours.  Alcohol Level  No results for input(s): ETH in the last 168 hours.  IMAGING past 24 hours DG CHEST PORT 1 VIEW  Result Date: 09/09/2020 CLINICAL DATA:  Pulmonary vascular congestion. EXAM: PORTABLE CHEST 1 VIEW COMPARISON:  Chest x-ray 09/05/2020. FINDINGS: Endotracheal tube and NG tube in  stable position. Heart size normal. Pulmonary vascularity normal. Mild right scratched it low lung volumes with bibasilar atelectasis. Mild right base infiltrate cannot be excluded. No pleural effusion or pneumothorax. IMPRESSION: 1. Lines and tubes in stable position. 2. Low lung volumes with bibasilar atelectasis. Mild right base infiltrate cannot be excluded. Electronically Signed   By: Maisie Fus  Register   On: 09/09/2020 05:28    PHYSICAL EXAM  Temp:  [98 F (36.7 C)-99.5 F (37.5 C)] 98 F (36.7 C) (02/23 0800) Pulse Rate:  [96-110] 99 (02/23 0752) Resp:  [14-21] 19 (02/23 0752) BP: (90-150)/(56-107) 102/69 (02/23 0752) SpO2:  [95 %-100 %] 98 % (02/23 0752) FiO2 (%):  [30 %] 30 % (02/23 0752)  General - Well nourished, well developed, intubated off sedation.  Ophthalmologic - fundi not visualized due to noncooperation.  Cardiovascular - Regular rhythm and tachycardia.  Neuro - intubated off sedation, eyes closed, not following commands. With forced eye opening, left eye in downward position, right eye mid position, left pupil 1.70mm, right 19mm, not reactive to light, doll's eyes absent, not tracking. Corneal reflex weakly present bilaterally, gag and cough present. No  breathing effort. Tried to wean off ventilator, Pt's RR dropped.  Facial symmetry not able to test due to ET tube.  Tongue protrusion not cooperative. On pain stimulation, BUE extension posturing (Lt >Rt), BLE mild withdraw. Bilateral positive babinski. Sensation, coordination and gait not tested.   ASSESSMENT/PLAN  Ms. QAMAR ROSMAN is a 55 y.o. female with history of hypertension with difficulty maintaining blood pressures and noncompliance to medications for various reasons, presented to the emergency room at [facility] for left-sided weakness and headaches.  She was appear to be confused and lethargic. She was seen by telemedicine evaluation by neurologist for concern for stroke and a noncontrast head  CT showed a right thalamic/basal ganglia hemorrhage 3.2 x 2.1 x 2.5  with intraventricular extension.  He was transferred ED to ED for inpatient neurological evaluation and admission. BP high at  178/109 mmHg on Admission. CTA head & neck showed No intracranial large vessel occlusion or proximal high-grade arterial stenosis. 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 Repeat CT Head on 08/27/20 showed- Stable parenchymal hemorrhage in the right thalamus and basal ganglia with intraventricular extension. ECHO LVEF 60-65%. mild concentric left ventricular hypertrophy. HgbA1c 6.0. LDL 167. Repeat CT head (08/27/20 )showed Stable parenchymal hemorrhage in the right thalamus and basal ganglia with intraventricular extension. S/P ventricular catheter placement on 08/27/2020. Following that Pt had labored breathing so intubated. CT head repeat post procedure didn't demonstrate any increase in size of hemorrhage.  3% normal saline was stopped. PT recommends SNF. MRA normal, MRI showed scattered b/l hippocampus, posterior CC, bilateral thalamus, and upper pons. Pt is on Vanco and Cefipime due to fever but stopped after negative cultures. Cultures negative until now. Goals of care discussed with family by Attending  and palliative care. Family meeting held on 09/03/20. Palliative care and CCM on board. Cefazolin for sinusitis started on 09/05/20 . Repeat CT showed Expected evolution of intracranial hemorrhage.  Multiple areas of low attenuation corresponding to some of the infarcts seen on prior MRI.  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   CT Head -  Right thalamic ICH with IVH in the third and fourth ventricles and nearly filling the right lateral ventricle. No hydrocephalus.   CTA head & neck - No intracranial large vessel occlusion or proximal high-grade arterial stenosis. Soft plaque results moderate/severe stenosis at the  origin of both vertebral arteries.   Repeat CT Head x 3 - Stable parenchymal hemorrhage in the right thalamus and basal ganglia with intraventricular extension as described.   2D ECHO LVEF 60-65%. mild concentric left ventricular hypertrophy  Repeat CT 09/07/20- Expected evolution of intracranial hemorrhage.  Multiple areas of low attenuation corresponding to some of the infarcts seen on prior MRI.  LDL 167  HgbA1c 6.0  Vitamin B12 301  VTE prophylaxis -  Lovenox  No antithrombotic prior to admission, now on No antithrombotic due to ICH.   Therapy recommendations: SNF  Disposition: husband is the POA, consented for trach and PEG  Slight obstructive hydrocephalus  CT Head -  IVH in the third and fourth ventricles and nearly filling the right lateral ventricle.  Ventricle size slightly larger than her baseline CT in 04/2020  Repeat CT head x 3 stable ventricle size.   NSG on board  S/P ventricular catheter placement on 08/27/2020. Neurosurgery clamped EVD 08/29/20 and dc on 08/30/20  Stroke - scattered b/l hippocampus, posterior CC, bilateral  thalamus, and upper pons - likely due to deep penetrating vessel compression by focal mass-effect  MRI brain 2/11 right greater than left mesial temporal lobes / parahippocampal cortex, bilateral basal ganglia, and possibly the upper pons that are concerning for acute infarcts, most likely secondary to mass effect from the hemorrhage.  MRA Normal  EEG No seizures or epileptiform discharges  Off 3% saline   MRI repeat Acute to early subacute infarcts in the bilateral cerebral white matter and right cerebellum, new from 08/28/2020. Interval evolution of multiple other infarcts present on the prior MRI. 3. Unchanged size of large hemorrhage centered in the right thalamus with extension into the midbrain. Unchanged 6 mm of leftward midline shift.  Hypertensive emergency hypotension  Home meds: Coreg 6.25 mg and Hydrochlorothiazide 25 mg  daily (Non complaint) . BP goal SP <160 mmHG  . Off Claviprex now . Long-term BP goal normotensive . on Coreg 12.5  . Low BP - D/c Hydrochlorothiazide 25 mg Daily and amlodipine 5  mg .  Hyperlipidemia  Home meds: None   LDL 167, goal < 70  Hold starting statin due to acute ICH  May consider statin at discharge  AKI  Cre 1.09 -> 1.94>1.09>1.04>1.07->0.93>0.90>0.97  Continue IV fluid  Foley placed on 08/28/20  BMP monitoring  Anemia   Hb 7.4->7.1>PRBC->9.5>9.5  S/p PRBC 2/20  CCM on board  CBC monitoring  Monitor and transfuse per protocol.  Fever, could be central fever  ? Sinusitis   Vancomycin and Cefipime -> Ancef (09/05/20)  Temp 101.1->100.3->100.6->100.8>99.7>99.8>98.0  WBC 13.0 >10.1>11.4->12.1>10.3>7.3  Bld Cultures -1/2 bottles growing staph epi (mecA resistance), could be contaminant.   UA neg  CXR neg  Buspar started 2/20  Other Stroke Risk Factors  Noncompliance   Hospital day #13   ATTENDING NOTE: I reviewed above note and agree with the assessment and plan. Pt was seen and examined.   Pt neuro unchanged, had family meeting today at 11am. Had extensive conversation with pt family including father, mother, husband, brother in law, etc. They requested PEG and trach with understanding the poor prognosis. BP low in the pm, stopped norvasc and diuretics. OK to continue coreg at this time. Afebrile, still on ancef, TF and FW.   For detailed assessment and plan, please refer to above as I have made changes wherever appropriate.   Marvel Plan, MD PhD Stroke Neurology 09/09/2020 7:55 PM       To contact Stroke Continuity provider, please refer to [email or web address]. After hours, contact General Neurology

## 2020-09-09 NOTE — Consult Note (Signed)
Consult Note  Charlene [name] Dec 02, 1965  [id number].    Requesting MD: Dr. Marvel Plan Chief Complaint/Reason for Consult: PEG placement   HPI:  Patient is a 54 year old female who presented to [facility] with left sided weakness and headache. PMH of HTN and medical noncompliance. CT at [facility] showed right thalamic/basal ganglia hemorrhage with intraventricular extension. Patient was transferred to [facility] for neuro evaluation and admission. Patient was admitted to the ICU and neurosurgery consulted for ventricular catheter placement for ICP monitoring. This was placed 2/10 in the afternoon. Patient became increasingly obtunded and PCCM was consulted, patient became apneic and was intubated 2/10. MRI ordered 2/11 and showed multiple acute strokes. Patient developed neurogenic fevers 2/12 and EVD was non-functioning as of 2/11. EVD removed 2/13. Palliative care consulted 2/13, patient's husband, Alison Murray, is the primary decision maker. Patient remains on the ventilator and unresponsive to tactile and verbal stimuli. She exhibits extensor posturing. Long term prognosis appears to be poor. Family has decided to proceed with trach and PEG placement. Past abdominal surgery includes cesarean section. NKDA.    ROS: Review of Systems  Unable to perform ROS: Patient unresponsive    Family History  Problem Relation Age of Onset  . Hypertension Father   . Cancer Maternal Grandmother   . Thyroid disease Paternal Uncle     Past Medical History:  Diagnosis Date  . Hypertension     Past Surgical History:  Procedure Laterality Date  . CESAREAN SECTION      Social History:  reports that she has never smoked. She has never used smokeless tobacco. She reports that she does not drink alcohol and does not use drugs.  Allergies: No Known Allergies  Medications Prior to Admission  Medication Sig Dispense Refill  . acetaminophen (TYLENOL) 500 MG tablet Take 1,000 mg by mouth every 6 (six) hours as  needed for mild pain.    . carvedilol (COREG) 6.25 MG tablet Take 1 tablet (6.25 mg total) by mouth 2 (two) times daily with a meal. 120 tablet 0  . hydrochlorothiazide (HYDRODIURIL) 25 MG tablet Take 1 tablet (25 mg total) by mouth daily. 60 tablet 0    Blood pressure 97/67, pulse 97, temperature 98 F (36.7 C), temperature source Axillary, resp. rate 14, height 5\' 5"  (1.651 m), weight 61 kg, last menstrual period 03/03/2014, SpO2 99 %, unknown if currently breastfeeding. Physical Exam:  General: WD, WN female who is laying in bed and appears critically ill HEENT: Normocephalci.  Sclera are noninjected. Pupils appear equal.  Ears and nose without any masses or lesions.  Mouth is pink and moist Heart: regular, rate, and rhythm.  Normal s1,s2. No obvious murmurs, gallops, or rubs noted.  Palpable radial and pedal pulses bilaterally Lungs: CTAB, no wheezes, rhonchi, or rales noted.  Respiratory effort nonlabored Abd: soft, NT, ND, +BS, no masses, hernias, or organomegaly.  MS: all 4 extremities are symmetrical with no cyanosis, clubbing, or edema. Skin: warm and dry with no masses, lesions, or rashes Neuro: unresponsive with extensor posturing Psych: unable to be assessed in current condition    Results for orders placed or performed during the hospital encounter of 08/26/20 (from the past 48 hour(s))  Glucose, capillary     Status: Abnormal   Collection Time: 09/07/20  3:38 PM  Result Value Ref Range   Glucose-Capillary 149 (H) 70 - 99 mg/dL    Comment: Glucose reference range applies only to samples taken after fasting for at  least 8 hours.  Glucose, capillary     Status: Abnormal   Collection Time: 09/07/20  7:51 PM  Result Value Ref Range   Glucose-Capillary 173 (H) 70 - 99 mg/dL    Comment: Glucose reference range applies only to samples taken after fasting for at least 8 hours.  Glucose, capillary     Status: Abnormal   Collection Time: 09/07/20 11:38 PM  Result Value Ref Range    Glucose-Capillary 133 (H) 70 - 99 mg/dL    Comment: Glucose reference range applies only to samples taken after fasting for at least 8 hours.  Glucose, capillary     Status: Abnormal   Collection Time: 09/08/20  3:58 AM  Result Value Ref Range   Glucose-Capillary 177 (H) 70 - 99 mg/dL    Comment: Glucose reference range applies only to samples taken after fasting for at least 8 hours.  Glucose, capillary     Status: Abnormal   Collection Time: 09/08/20  7:57 AM  Result Value Ref Range   Glucose-Capillary 123 (H) 70 - 99 mg/dL    Comment: Glucose reference range applies only to samples taken after fasting for at least 8 hours.  Glucose, capillary     Status: Abnormal   Collection Time: 09/08/20 11:50 AM  Result Value Ref Range   Glucose-Capillary 149 (H) 70 - 99 mg/dL    Comment: Glucose reference range applies only to samples taken after fasting for at least 8 hours.  Glucose, capillary     Status: Abnormal   Collection Time: 09/08/20  3:36 PM  Result Value Ref Range   Glucose-Capillary 167 (H) 70 - 99 mg/dL    Comment: Glucose reference range applies only to samples taken after fasting for at least 8 hours.  Glucose, capillary     Status: Abnormal   Collection Time: 09/08/20  8:05 PM  Result Value Ref Range   Glucose-Capillary 138 (H) 70 - 99 mg/dL    Comment: Glucose reference range applies only to samples taken after fasting for at least 8 hours.  Glucose, capillary     Status: Abnormal   Collection Time: 09/08/20 11:41 PM  Result Value Ref Range   Glucose-Capillary 159 (H) 70 - 99 mg/dL    Comment: Glucose reference range applies only to samples taken after fasting for at least 8 hours.  Glucose, capillary     Status: Abnormal   Collection Time: 09/09/20  3:57 AM  Result Value Ref Range   Glucose-Capillary 150 (H) 70 - 99 mg/dL    Comment: Glucose reference range applies only to samples taken after fasting for at least 8 hours.  Basic metabolic panel     Status: Abnormal    Collection Time: 09/09/20  4:38 AM  Result Value Ref Range   Sodium 140 135 - 145 mmol/L   Potassium 3.8 3.5 - 5.1 mmol/L   Chloride 95 (L) 98 - 111 mmol/L   CO2 33 (H) 22 - 32 mmol/L   Glucose, Bld 147 (H) 70 - 99 mg/dL    Comment: Glucose reference range applies only to samples taken after fasting for at least 8 hours.   BUN 31 (H) 6 - 20 mg/dL   Creatinine, Ser 4.09 0.44 - 1.00 mg/dL   Calcium 9.1 8.9 - 81.1 mg/dL   GFR, Estimated >91 >47 mL/min    Comment: (NOTE) Calculated using the CKD-EPI Creatinine Equation (2021)    Anion gap 12 5 - 15    Comment: Performed at [facility], [address] [place], [place] [address]  CBC     Status: Abnormal   Collection Time: 09/09/20  4:38 AM  Result Value Ref Range   WBC 7.3 4.0 - 10.5 K/uL   RBC 3.68 (L) 3.87 - 5.11 MIL/uL   Hemoglobin 9.5 (L) 12.0 - 15.0 g/dL   HCT 57.8 (L) 46.9 - 62.9 %   MCV 78.0 (L) 80.0 - 100.0 fL   MCH 25.8 (L) 26.0 - 34.0 pg   MCHC 33.1 30.0 - 36.0 g/dL   RDW 52.8 (H) 41.3 - 24.4 %   Platelets 498 (H) 150 - 400 K/uL   nRBC 0.0 0.0 - 0.2 %    Comment: Performed at [facility], [address], [place], [place] [address]  Magnesium     Status: None   Collection Time: 09/09/20  4:38 AM  Result Value Ref Range   Magnesium 2.4 1.7 - 2.4 mg/dL    Comment: Performed at [facility], [address] [place], [place] [address]  Glucose, capillary     Status: Abnormal   Collection Time: 09/09/20  8:44 AM  Result Value Ref Range   Glucose-Capillary 148 (H) 70 - 99 mg/dL    Comment: Glucose reference range applies only to samples taken after fasting for at least 8 hours.  Glucose, capillary     Status: Abnormal   Collection Time: 09/09/20 12:03 PM  Result Value Ref Range   Glucose-Capillary 140 (H) 70 - 99 mg/dL    Comment: Glucose reference range applies only to samples taken after fasting for at least 8 hours.   DG CHEST PORT 1 VIEW  Result Date: 09/09/2020 CLINICAL DATA:  Pulmonary  vascular congestion. EXAM: PORTABLE CHEST 1 VIEW COMPARISON:  Chest x-ray 09/05/2020. FINDINGS: Endotracheal tube and NG tube in stable position. Heart size normal. Pulmonary vascularity normal. Mild right scratched it low lung volumes with bibasilar atelectasis. Mild right base infiltrate cannot be excluded. No pleural effusion or pneumothorax. IMPRESSION: 1. Lines and tubes in stable position. 2. Low lung volumes with bibasilar atelectasis. Mild right base infiltrate cannot be excluded. Electronically Signed   By: Maisie Fus  Register   On: 09/09/2020 05:28      Assessment/Plan Uncontrolled HTN with HTN emergency Medication non-compliance Intraparenchymal hemorrhage with R thalamic hemorrhage with extension into third, fourth and right lateral ventricle Multiple acute strokes Obstructive hydrocephalus Acute VDRF secondary to CVA Neurogenic fever Anemia of critical illness  Consult for PEG placement  Dysphagia  - Will plan for bedside PEG placement tomorrow around 1PM - Discussed with husband over the phone the procedure, indications, and risks including but not limited to bleeding, infection, issues with the tube after placement (leakage etc), worsening of preexisting medical conditions that could lead to death. He states understanding and would like to proceed.   Leary Roca, [facility] Surgery 09/09/2020, 2:41 PM Please see Amion for pager number during day hours 7:00am-4:30pm

## 2020-09-10 ENCOUNTER — Inpatient Hospital Stay (HOSPITAL_COMMUNITY): Payer: Medicaid Other

## 2020-09-10 ENCOUNTER — Encounter (HOSPITAL_COMMUNITY)
Admission: EM | Disposition: A | Discharge status: Skilled Nursing Facility | Payer: Self-pay | Source: Home / Self Care | Attending: Internal Medicine

## 2020-09-10 DIAGNOSIS — Z930 Tracheostomy status: Secondary | ICD-10-CM

## 2020-09-10 HISTORY — PX: ESOPHAGOGASTRODUODENOSCOPY: SHX5428

## 2020-09-10 HISTORY — PX: PEG PLACEMENT: SHX5437

## 2020-09-10 LAB — PROTIME-INR
INR: 1.2 (ref 0.8–1.2)
Prothrombin Time: 14.3 seconds (ref 11.4–15.2)

## 2020-09-10 LAB — HEMOGLOBIN AND HEMATOCRIT, BLOOD
HCT: 28.4 % — ABNORMAL LOW (ref 36.0–46.0)
Hemoglobin: 9 g/dL — ABNORMAL LOW (ref 12.0–15.0)

## 2020-09-10 LAB — GLUCOSE, CAPILLARY
Glucose-Capillary: 111 mg/dL — ABNORMAL HIGH (ref 70–99)
Glucose-Capillary: 118 mg/dL — ABNORMAL HIGH (ref 70–99)
Glucose-Capillary: 119 mg/dL — ABNORMAL HIGH (ref 70–99)
Glucose-Capillary: 120 mg/dL — ABNORMAL HIGH (ref 70–99)
Glucose-Capillary: 124 mg/dL — ABNORMAL HIGH (ref 70–99)
Glucose-Capillary: 124 mg/dL — ABNORMAL HIGH (ref 70–99)

## 2020-09-10 LAB — CULTURE, BLOOD (ROUTINE X 2)
Culture: NO GROWTH
Special Requests: ADEQUATE

## 2020-09-10 LAB — APTT: aPTT: 38 seconds — ABNORMAL HIGH (ref 24–36)

## 2020-09-10 IMAGING — DX DG CHEST 1V PORT
1 series · 1 of 1 positions shown · non-contrast
Comparison: Portable exam [XB] hours compared to [DATE]

CLINICAL DATA: Post tracheostomy

EXAM:
PORTABLE CHEST 1 VIEW

[chest]
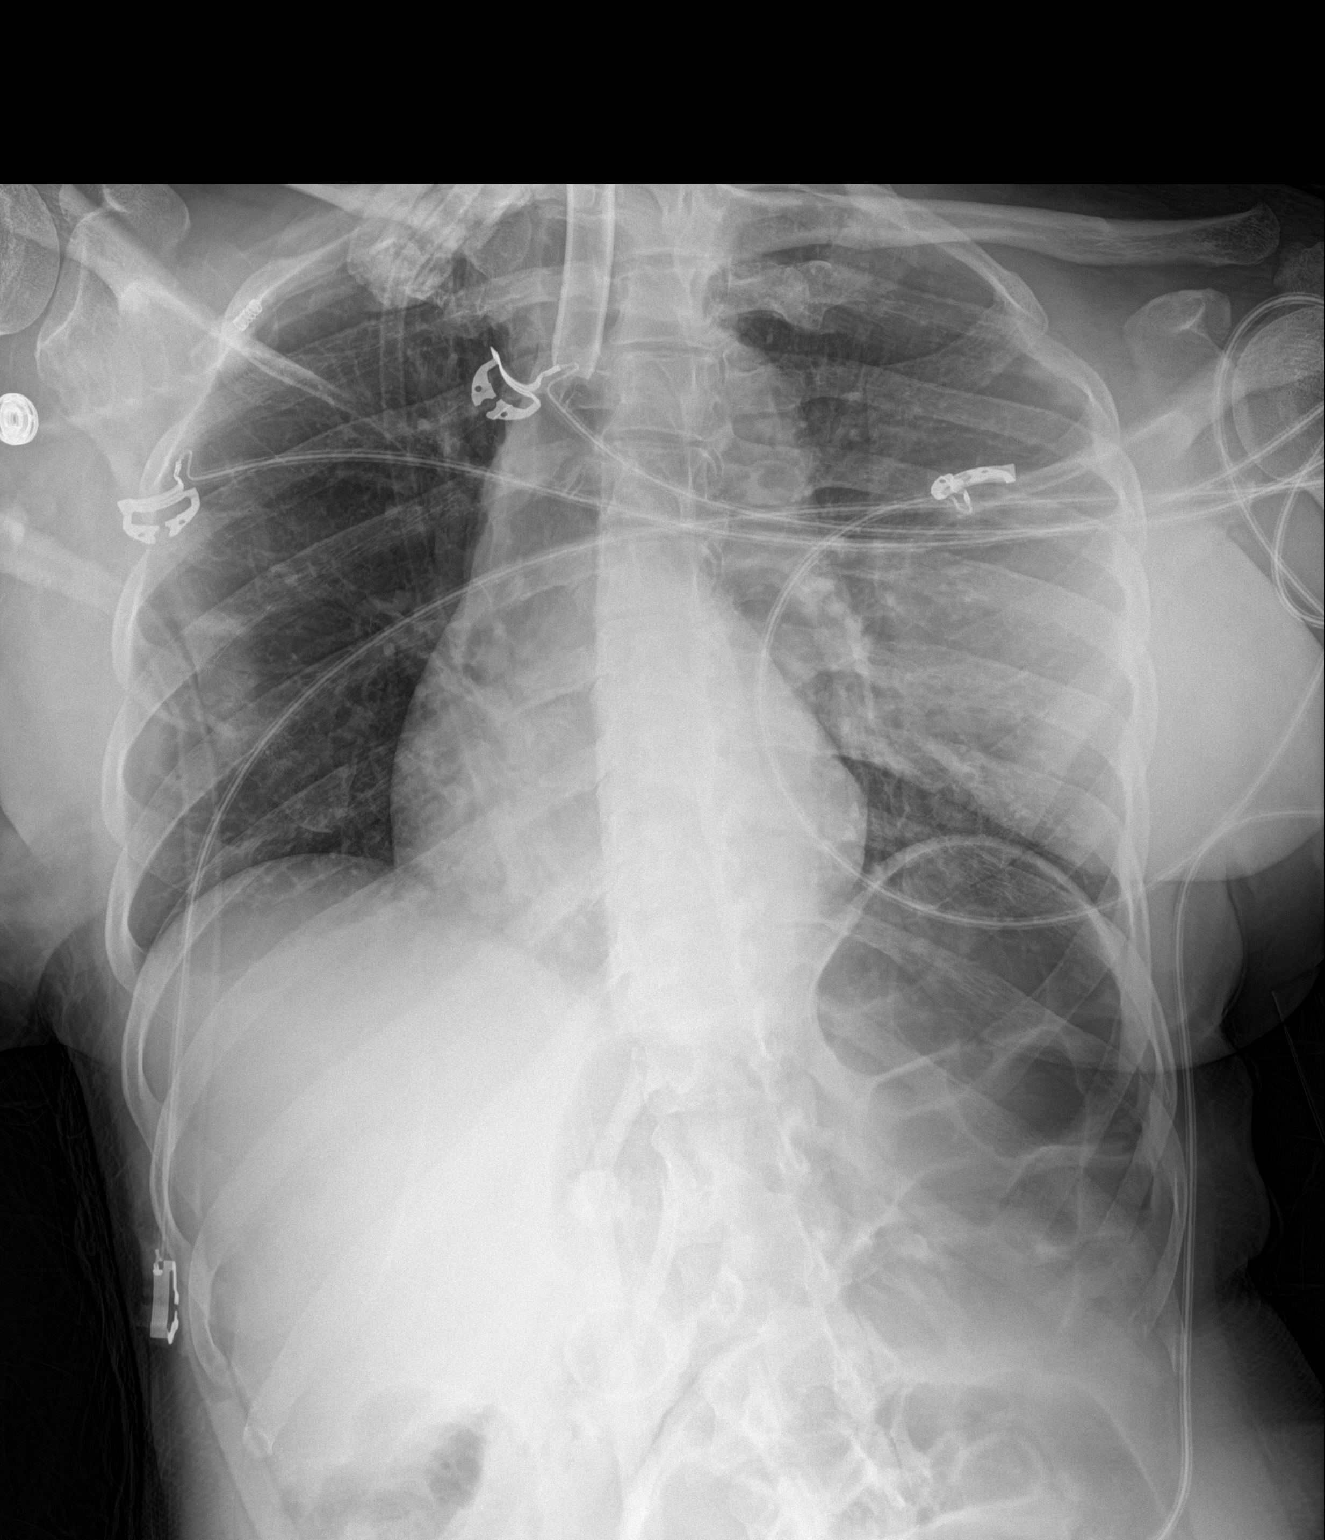

[1 of 1 positions shown; findings below may reference images not displayed]

FINDINGS: Tracheostomy tube projects over tracheal air column with tip
approximately 3.4 cm above carina.

Normal heart size, mediastinal contours, and pulmonary vascularity.

Rotated to the RIGHT.

Lungs clear.

No acute infiltrate, pleural effusion, or pneumothorax.
IMPRESSION: New tracheostomy tube without acute abnormalities.

## 2020-09-10 SURGERY — EGD (ESOPHAGOGASTRODUODENOSCOPY)
Anesthesia: Moderate Sedation

## 2020-09-10 MED ORDER — FENTANYL CITRATE (PF) 100 MCG/2ML IJ SOLN
100.0000 ug | Freq: Once | INTRAMUSCULAR | Status: AC
  Administered 2020-09-10: 100 ug via INTRAVENOUS

## 2020-09-10 MED ORDER — MORPHINE SULFATE (PF) 2 MG/ML IV SOLN
1.0000 mg | Freq: Once | INTRAVENOUS | Status: AC
  Administered 2020-09-10: 1 mg via INTRAVENOUS

## 2020-09-10 MED ORDER — MIDAZOLAM HCL 2 MG/2ML IJ SOLN
INTRAMUSCULAR | Status: AC
  Filled 2020-09-10: qty 6

## 2020-09-10 MED ORDER — VECURONIUM BROMIDE 10 MG IV SOLR
10.0000 mg | Freq: Once | INTRAVENOUS | Status: AC
  Administered 2020-09-10: 10 mg via INTRAVENOUS

## 2020-09-10 MED ORDER — FENTANYL CITRATE (PF) 100 MCG/2ML IJ SOLN
INTRAMUSCULAR | Status: AC
  Filled 2020-09-10: qty 2

## 2020-09-10 MED ORDER — SODIUM CHLORIDE 0.9 % IV SOLN: INTRAVENOUS | Status: DC

## 2020-09-10 MED ORDER — MIDAZOLAM HCL 2 MG/2ML IJ SOLN
5.0000 mg | Freq: Once | INTRAMUSCULAR | Status: AC
  Administered 2020-09-10: 5 mg via INTRAVENOUS

## 2020-09-10 MED ORDER — MORPHINE SULFATE (PF) 2 MG/ML IV SOLN
INTRAVENOUS | Status: AC
  Filled 2020-09-10: qty 1

## 2020-09-10 MED ORDER — VECURONIUM BROMIDE 10 MG IV SOLR
INTRAVENOUS | Status: AC
  Filled 2020-09-10: qty 10

## 2020-09-10 MED ORDER — SODIUM CHLORIDE 0.9 % IV BOLUS
500.0000 mL | Freq: Once | INTRAVENOUS | Status: AC
  Administered 2020-09-10: 500 mL via INTRAVENOUS

## 2020-09-10 NOTE — Progress Notes (Signed)
RT at bedside during placement of tracheostomy.

## 2020-09-10 NOTE — Progress Notes (Signed)
Called by nurse for persistent active bleeding from PEG insertion site. No blood in tube. Just bleeding from around tube. Cinching G tube to 3cm was not successful - pt rebled per nurse. Nurse noted that bleeding was more pronounced when pt was HTN  On arrival at around 11pm, pt in NAD. Vitals stable. hgb down 0.5  Actively bleeding from pt's right side of PEG insertion site- appeared to have a small active pumper coming up from under skin - unclear if dermal arteriole or perhaps inferior epigastric artery. Peg a little medial.   I retrieved suture kit and nylon from ED Pt given 1mg morphine Phalange pulled back on tube to better expose area Area prepped with betadine I placed 2 deep interrupted 3-0 vicryl sutures This appeared to cease the bleeding. I observed area for several minutes. No addl active bleeding noted. Phalange returned to 3.5cm mark TF resumed.   Eric M. Wilson, MD, FACS General, Bariatric, & Minimally Invasive Surgery [facility]  

## 2020-09-10 NOTE — Progress Notes (Signed)
SLP Cancellation Note  Patient Details Name: Charlene [name] MRN: [id number] DOB: [birth year]   Cancelled evaluation:       Reason Eval/Treat Not Completed: Patient not medically ready. New orders received for swallow and PMV evaluations. Pt remains on ventilator and is poorly responsive. Our service will sign off and await new orders if warranted.   Amanda L. Samson [name], MA CCC/SLP Acute Rehabilitation Services Office number [phone] Pager [phone]    [name] 09/10/2020, 3:21 PM

## 2020-09-10 NOTE — TOC CAGE-AID Note (Signed)
Transition of Care [facility]) - CAGE-AID Screening   Patient Details  Name: Charlene [name] MRN: [id number] Date of Birth: 03-Nov-1965  [facility] of [facility] CM/SW Contact:    Mearl Latin, LCSW Phone Number: 09/10/2020, 11:39 AM   Clinical Narrative: Patient unable to participate in screening.    CAGE-AID Screening: Substance Abuse Screening unable to be completed due to: : Patient unable to participate

## 2020-09-10 NOTE — Progress Notes (Signed)
1715 TF restarted.  1800 no drainage at PEG site.  At shift change this RN and HS RN entered room to find abd binder saturated with bright blood and some clots. Pressure held, MD E. Wilson paged; return page with instructions to cinch to 3cm marking on PEG tubing (currently at 4cm). Still actively oozing blood at this time.  Okay to resume TF, clean abd binder applied.

## 2020-09-10 NOTE — Progress Notes (Addendum)
STROKE TEAM PROGRESS NOTE   INTERVAL HISTORY Pt is evaluated at the bedside. No family is at the bedside. RN present in the Room. Pt still intubated on vent, posturing of BUEs (Lt>RT)and withdraw of BLEs on pain stimulation. Some spontaneous movements  in b/l feet (Rt>Lt) observed today.  No significant neuro changes. Eyes in dysconjugate position. Dolls eyes's absent. Corneal reflex weak B/L. Gag and cough present. No ventilatory effort present. BP has been labile and running on lower side, Systolic between 09-811 mmHg and diastolic between 91-478 mmHg . Creatinine 0.97, wbc 7.3, Hb stable at 9.5. Pt is Afebrile. On Ancef for sinusitis. Family meeting held yesterday, family decided for trach and PEG with understanding of poor prognosis.  Trach and PEG Placement is planned for today. Family want to consider [facility] transfer center for second opinion.   Vitals:   09/10/20 0600 09/10/20 0700 09/10/20 0800 09/10/20 0833  BP: (!) 141/85 94/67 (!) 144/91 (!) 144/91  Pulse: (!) 101 99 99 (!) 101  Resp: 15 14 19 14   Temp:      TempSrc:      SpO2: 100% 97% 98% 99%  Weight:      Height:       CBC:  Recent Labs  Lab 09/03/20 0944 09/05/20 0457 09/07/20 0452 09/09/20 0438  WBC 11.4*   < > 10.3 7.3  NEUTROABS 9.0*  --   --   --   HGB 7.4*   < > 9.5* 9.5*  HCT 22.7*   < > 29.9* 28.7*  MCV 76.4*   < > 79.1* 78.0*  PLT 207   < > 360 498*   < > = values in this interval not displayed.   Basic Metabolic Panel:  Recent Labs  Lab 09/07/20 0452 09/09/20 0438  NA 139 140  K 4.0 3.8  CL 98 95*  CO2 30 33*  GLUCOSE 138* 147*  BUN 21* 31*  CREATININE 0.90 0.97  CALCIUM 8.9 9.1  MG  --  2.4   Lipid Panel:  No results for input(s): CHOL, TRIG, HDL, CHOLHDL, VLDL, LDLCALC in the last 168 hours. HgbA1c:  No results for input(s): HGBA1C in the last 168 hours. Urine Drug Screen:  No results for input(s): LABOPIA, COCAINSCRNUR, LABBENZ, AMPHETMU, THCU, LABBARB in the last 168 hours.  Alcohol Level   No results for input(s): ETH in the last 168 hours.  IMAGING past 24 hours No results found.  PHYSICAL EXAM  Temp:  [98.2 F (36.8 C)-99.1 F (37.3 C)] 99.1 F (37.3 C) (02/24 0400) Pulse Rate:  [92-110] 101 (02/24 0833) Resp:  [14-33] 14 (02/24 0833) BP: (89-144)/(59-116) 144/91 (02/24 0833) SpO2:  [94 %-100 %] 99 % (02/24 0833) FiO2 (%):  [30 %] 30 % (02/24 0833)  General - Well nourished, well developed, intubated off sedation.  Ophthalmologic - fundi not visualized due to noncooperation.  Cardiovascular - Regular rhythm and tachycardia.  Neuro - intubated off sedation, eyes closed, not following commands. With forced eye opening, left eye in downward position, right eye mid position, left pupil 1.36mm, right 63mm, not reactive to light, doll's eyes absent, not tracking. Corneal reflex weakly present bilaterally, gag and cough present. No breathing effort. Facial symmetry not able to test due to ET tube.  Tongue protrusion not cooperative. On pain stimulation, BUE extension posturing (Lt >Rt), BLE mild withdraw. Some spontaneous movements  in b/l feet (Rt>Lt) observed today. Bilateral positive babinski. Sensation, coordination and gait not tested.   ASSESSMENT/PLAN  Charlene [name] is a 55 y.o. female with history of hypertension with difficulty maintaining blood pressures and noncompliance to medications for various reasons, presented to the emergency room at [facility] for left-sided weakness and headaches.  She was appear to be confused and lethargic. She was seen by telemedicine evaluation by neurologist for concern for stroke and a noncontrast head CT showed a right thalamic/basal ganglia hemorrhage 3.2 x 2.1 x 2.5  with intraventricular extension.  He was transferred ED to ED for inpatient neurological evaluation and admission. BP high at  178/109 mmHg on Admission. CTA head & neck showed No intracranial large vessel occlusion or proximal high-grade arterial  stenosis. 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 Repeat CT Head on 08/27/20 showed- Stable parenchymal hemorrhage in the right thalamus and basal ganglia with intraventricular extension. ECHO LVEF 60-65%. mild concentric left ventricular hypertrophy. HgbA1c 6.0. LDL 167. Repeat CT head (08/27/20 )showed Stable parenchymal hemorrhage in the right thalamus and basal ganglia with intraventricular extension. S/P ventricular catheter placement on 08/27/2020. Following that Pt had labored breathing so intubated. CT head repeat post procedure didn't demonstrate any increase in size of hemorrhage.  3% normal saline was stopped. PT recommends SNF. MRA normal, MRI showed scattered b/l hippocampus, posterior CC, bilateral thalamus, and upper pons. Pt is on Vanco and Cefipime due to fever but stopped after negative cultures. Cultures negative until now. Goals of care discussed with family by Attending  and palliative care. Family meeting held on 09/03/20. Palliative care and CCM on board. Cefazolin for sinusitis started on 09/05/20 . Repeat CT showed Expected evolution of intracranial hemorrhage.  Multiple areas of low attenuation corresponding to some of the infarcts seen on prior MRI.  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   CT Head -  Right thalamic ICH with IVH in the third and fourth ventricles and nearly filling the right lateral ventricle. No hydrocephalus.   CTA head & neck - No intracranial large vessel occlusion or proximal high-grade arterial stenosis. 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   Repeat CT Head x 3 - Stable parenchymal hemorrhage in the right thalamus and basal ganglia with intraventricular extension as described.   2D ECHO LVEF 60-65%. mild concentric left ventricular hypertrophy  Repeat CT 09/07/20- Expected evolution of intracranial hemorrhage.   Multiple areas of low attenuation corresponding to some of the infarcts seen on prior MRI.  LDL 167  HgbA1c 6.0  Vitamin B12 301  VTE prophylaxis -  Lovenox  Trach and PEG placement today.  No antithrombotic prior to admission, now on No antithrombotic due to ICH.   Therapy recommendations: SNF  Disposition: husband is the POA, consented for trach and PEG. Trach and PEG placement planned for today.   Slight obstructive hydrocephalus  CT Head -  IVH in the third and fourth ventricles and nearly filling the right lateral ventricle.  Ventricle size slightly larger than her baseline CT in 04/2020  Repeat CT head x 3 stable ventricle size.   NSG on board  S/P ventricular catheter placement on 08/27/2020. Neurosurgery clamped EVD 08/29/20 and dc on 08/30/20  Stroke - scattered b/l hippocampus, posterior CC, bilateral thalamus, and upper pons - likely due to deep penetrating vessel compression by focal mass-effect  MRI brain 2/11 right greater than left mesial temporal lobes / parahippocampal cortex, bilateral basal ganglia, and possibly the upper pons that  are concerning for acute infarcts, most likely secondary to mass effect from the hemorrhage.  MRA Normal  EEG No seizures or epileptiform discharges  Off 3% saline   MRI repeat Acute to early subacute infarcts in the bilateral cerebral white matter and right cerebellum, new from 08/28/2020. Interval evolution of multiple other infarcts present on the prior MRI. 3. Unchanged size of large hemorrhage centered in the right thalamus with extension into the midbrain. Unchanged 6 mm of leftward midline shift.  Respiratory failure  Intubated on vent  Vent dependent  Family requested Trach   Will do trach by Dr. Denese Killings today  Dysphagia   TF with cortrak  Family requested PEG  Will do PEG today by Trauma  Hypertensive emergency hypotension  Home meds: Coreg 6.25 mg and Hydrochlorothiazide 25 mg daily (Non  complaint) . BP goal SP <160 mmHG  . Off Claviprex now . Long-term BP goal normotensive . on Coreg 12.5  . Hydrochlorothiazide 25 mg Daily and amlodipine 5  Mg stopped on 2/23 because of low BP.  Hyperlipidemia  Home meds: None   LDL 167, goal < 70  Hold starting statin due to acute ICH  May consider statin at discharge  AKI  Cre 1.09 -> 1.94>1.09>1.04>1.07->0.93>0.90>0.97  Continue IV fluid  Foley placed on 08/28/20  BMP monitoring  Anemia   Hb 7.4->7.1>PRBC->9.5>9.5  S/p PRBC 2/20  CCM on board  CBC monitoring  Monitor and transfuse per protocol.  Fever, could be central fever  ? Sinusitis   Vancomycin and Cefipime -> Ancef (09/05/20)  Temp 101.1->100.3->100.6->100.8>99.7>99.8>98.0>99.1  WBC 13.0 >10.1>11.4->12.1>10.3>7.3  Bld Cultures -1/2 contaminant.   UA neg  CXR neg  Buspar started 2/20  Other Stroke Risk Factors  Noncompliance   Hospital day #14   ATTENDING NOTE: I reviewed above note and agree with the assessment and plan. Pt was seen and examined.   RN at the bedside, no family in room. Pt neuro unchanged. Family meeting done yesterday, requested PEG and Trach by family. Dr. Denese Killings and Trauma service will do the Trach and PEG today. BP still fluctuate, close monitoring. No fever overnight and leukocytosis improved.   For detailed assessment and plan, please refer to above as I have made changes wherever appropriate.   Marvel Plan, MD PhD Stroke Neurology 09/10/2020 6:17 PM  This patient is critically ill due to severe right thalamic and midbrain hemorrhage, brain herniation, cerebral edema, stroke, respiratory failure and at significant risk of neurological worsening, death form brain herniation, heart failure, seizure, sepsis. This patient's care requires constant monitoring of vital signs, hemodynamics, respiratory and cardiac monitoring, review of multiple databases, neurological assessment, discussion with family, other  specialists and medical decision making of high complexity. I spent 30 minutes of neurocritical care time in the care of this patient.   To contact Stroke Continuity provider, please refer to [email or web address]. After hours, contact General Neurology

## 2020-09-10 NOTE — Progress Notes (Signed)
Had been checking pt abd/peg site every 15-20 mintues since shift change and initial saturation of abd binder. Bleeding had remained slow and controlled until around 1030 when it was noted that pt abd binder was completely saturated again. Paged and spoke with Dr. Fredonia Highland again; after verbal order for stat CBC stated he would come evaluate the pt in person. Clean abd binder applied.

## 2020-09-10 NOTE — Procedures (Signed)
Diagnostic Bronchoscopy  ENEIDA EVERS  [id number]  [birth year]  Date:09/10/20  Time:3:07 PM   Provider Performing:Dev Dhondt F Earlene Plater   Procedure: Diagnostic Bronchoscopy [phone])  Indication(s) Assist with direct visualization of tracheostomy placement  Consent Risks of the procedure as well as the alternatives and risks of each were explained to the patient and/or caregiver.  Consent for the procedure was obtained.   Anesthesia See separate tracheostomy note   Time Out Verified patient identification, verified procedure, site/side was marked, verified correct patient position, special equipment/implants available, medications/allergies/relevant history reviewed, required imaging and test results available.   Sterile Technique Usual hand hygiene, masks, gowns, and gloves were used   Procedure Description Bronchoscope advanced through endotracheal tube and into airway.  After suctioning out tracheal secretions, bronchoscope used to provide direct visualization of tracheostomy placement.   Complications/Tolerance None; patient tolerated the procedure well.   EBL None  Specimen(s) None   Delfin Gant, NP-C [facility] Personal contact information can be found on Amion  If no response please page: Adult pulmonary and critical care medicine pager on Amion unitl 7pm After 7pm please call [phone] 09/10/2020, 3:07 PM  \

## 2020-09-10 NOTE — Op Note (Addendum)
Preoperative diagnosis: dysphagia  Postoperative diagnosis: same   Procedure: endoscopic placement of gastrostomy by pull technique  Surgeon: Feliciana Rossetti, M.D.  Asst: Leary Roca, [facility]  Anesthesia: general  Indications for procedure: Charlene [name] is a 56 y.o. year old female with symptoms of dysphagia after stroke.  Description of procedure: The patient was brought into the operative suite. Anesthesia was administered with General endotracheal anesthesia. WHO checklist was applied. The patient was then placed in supine position. The area was prepped and draped in the usual sterile fashion.  Next, the endoscope was intubated into the mouth and slowly advanced though the pharynx and esophagus. The stomach was identified. Transillumination was used to identify a place in the left subcostal area of the abdomen.   An incision was made at the point of transillumination. A 14ga needle was used to gain access to the stomach in the distal body. A looped wire was introduced and ensured by endoscopic snare. The looped wire was secured to the 24 fr gastrostomy tube and brought back through the mouth and esophagus and out the skin incision. The endoscope was reinserted to confirm the mushroom was against the stomach and abdominal wall. The wafer was secured at 3 cm. The patient tolerated the procedure well. All counts were correct.  Findings: GE junction with some metaplasia, g tube in appropriate position  Specimen: none  Implant: 24 fr gastrostomy tube   Blood loss: <10 ml  Local anesthesia: 5 ml Marcaine  Complications: none  Feliciana Rossetti, M.D. General, Bariatric, & Minimally Invasive Surgery [facility]

## 2020-09-10 NOTE — Progress Notes (Signed)
NAME:  Charlene [name], MRN:  [id number], DOB:  02-Jun-1966, LOS: 73 ADMISSION DATE:  08/26/2020, CONSULTATION DATE:  2/10 REFERRING MD:  Dr. Erlinda Hong, CHIEF COMPLAINT:  ICH   Brief History:  55 year old female admitted with hypertensive R thalamic/basal ganglia ICH. Resultant obstructive hydrocephalus prompting EVD placement 2/10. Subsequently, 2/10 she had acute mental status change to obtundation and respiratory arrest resulting in intubation.  Past Medical History:  HTN, medication noncompliance  Significant Hospital Events:  2/09 Admit for ICH 2/10 EVD placed, later intubated due to obtundation and respiratory arrest 2/11 MRI Brain, hypertonic saline d/c'd, EVD not working.  Husband did not consent to PICC  2/13 EVD removed 2/17 GOC discussion with family, PMT  Consults:  Stroke PCCM Neurosurgery  Procedures:  ETT 2/10 >> EVD 2/10 > non functioning 2/11 at 1500; clamped 2/12 am > 2/13  Significant Diagnostic Tests:  CT Head 2/9 >> Acute hemorrhage with the epicenter in the right thalamus. Measurement is approximately 3.2 x 2.1 x 2.5 cm (volume 8.8 cm^3). Intraventricular penetration with blood filling the third and fourth ventricles and nearly filling the right lateral ventricle. No hydrocephalus. CTA Head/Neck 2/10 >> Unchanged size of intraparenchymal hemorrhage centered in the right thalamus and basal ganglia with extension into the right lateral ventricle. 2. No intracranial arterial occlusion or high-grade stenosis. No dissection, aneurysm or hemodynamically significant stenosis of the carotid or vertebral arteries. CT Head 2/10 >> Stable parenchymal hemorrhage in the right thalamus and basal ganglia with intraventricular extension as described. No new focal abnormality is noted. CT Head 2/10 (post intubation) >> redemonstrated intraparenchymal hemorrhage centered within the R thalamus/basal ganglia extending into the cerebral peduncle/midbrain with associated intraventricular  extension and hemorrhage in the R lateral ventricle and fourth ventricle, overall not significantly changed from prior; stable mass effect, 63m of R to L midline shift, postsurgical changes of ventriculostomy TTE 2/10 >> LVEF 60 to 65%, LV has normal function, no regional wall motion abnormalities, mild concentric LVH.  RV systolic function is normal, normal pulmonary artery systolic pressure.  MRA head 2/11 >>Negative intracranial MR angiography. No evidence of aneurysm or high flow vascular malformation to explain the intraparenchymal hemorrhage  MRI brain 2/11 >> Similar intraparenchymal hemorrhage centered within the right thalamus and basal ganglia extending into the right midbrain with intraventricular extension, effacement of the prepontine cistern and similar 4 mm of leftward midline shift; separate areas of restricted diffusion and edema involving the R > L mesial temporal lobes/parahippocampal cortex, bilateral basal ganglia, and possibly the upper pons that are c/f acute infarcts, most likely secondary to mass effect from hemorrhage. MRI brain 2/19 >> intraventricular clot has decreased in size but mid-brain parenchymal clot has not and there are more ischemic infarcts in the surrounding brain.  CT Head WO Contrast 09/07/20>> Expected evolution of intracranial hemorrhage since 08/30/2020 head CT. Multiple areas of low attenuation corresponding to some of the infarcts seen on prior MRI. Ventricle caliber similar to prior MRI   Micro Data:  2/9 COVID + Influenza negative 2/10 MRSA PCR negative 2/12 BCx 2 > no growth  2/19 BC 1/2 +Stap EPI, 1/2 no growth   Antimicrobials:  Cefepime 2/12 > 2/15 Vanc 2/12 > 2/15  Interim History / Subjective:  No acute change overnight.  Plan for trach/PEG today.   Objective   Blood pressure (!) 144/91, pulse (!) 101, temperature 99.1 F (37.3 C), temperature source Axillary, resp. rate 14, height '5\' 5"'  (1.651 m), weight 61 kg, last menstrual period  03/03/2014, SpO2 99 %, unknown if currently breastfeeding.    Vent Mode: PRVC FiO2 (%):  [30 %] 30 % Set Rate:  [14 bmp] 14 bmp Vt Set:  [450 mL] 450 mL PEEP:  [5 cmH20] 5 cmH20 Plateau Pressure:  [12 cmH20-15 cmH20] 13 cmH20   Intake/Output Summary (Last 24 hours) at 09/10/2020 7681 Last data filed at 09/10/2020 0600 Gross per 24 hour  Intake 1194.86 ml  Output 2100 ml  Net -905.14 ml   Filed Weights   09/05/20 0500 09/06/20 0400 09/08/20 0442  Weight: 63.4 kg 65.5 kg 61 kg   Examination: General: adult female, on vent  HEENT: ETT/OG in place Neuro: severe left down-ward gaze,LLE withdrawals, posturing noted to RLE, RUE per RN CV: RRR, no MRG PULM:  resps even non labored on vent, coarse  GI: distended, soft, active bowel sounds  Extremities: -edema  Skin: dry/intact   Resolved Hospital Problem list   Hypernatremia   Assessment & Plan:  Multiple acute strokes  - Per neuro "scattered b/l hippocampus, posterior CC, bilateral thalamus, and upper pons, likely due to deep penetrating vessel compression by focal mass-effect" Intraparenchymal hemorrhage with right thalamic hemorrhage with extension into the third fourth and right lateral ventricle -Secondary to HTN and medication noncompliance  Obstructive hydrocephalus -S/P EVD placement 2/10, subsequently clamped and removed 2/13 -S/P 3% P: Management per neurology  Maintain neuro protective measures; goal for eurothermia, euglycemia, eunatermia, normoxia, and PCO2 goal of 35-40 Nutrition and bowel regiment  Seizure precautions  Aspirations precautions   Hypertensive emergency -Home medications include Coreg and HCTZ P:  SBP goal <160 Continue Norvasc, Coreg, HCTZ PRN IV antihypertensives  Closely monitor hemodynamics in the ICU setting   Acute hypoxemic respiratory failure secondary to CVA P: Vent support - 8cc/kg  F/u CXR  F/u ABG Plan for trach 2/24 SBT as tol, ATC trials post trach  VAP bundle in place    Central fever  P: Buspirone started 2/20 Trend CBC and fever curve   Possible bacterial sinusitis -Extensive diffuse paranasal sinus opacification and moderate mastoid effusions in the setting of intubation. Tan secretions per ETT P: Currently on Ancef (Stop date in place)   Anemia P: Trend CBC  Monitor for bleeding Transfuse per protocol     Daily Goals Checklist  Pain/Anxiety/Delirium protocol (if indicated): None Neuro vitals: every 4 hours VAP protocol (if indicated): Bundle in place Respiratory support goals: pressure support as tolerated. DVT prophylaxis: Enoxaparin Nutrition Status: Tolerating tube feeds GI prophylaxis: Protonix Urinary catheter: External catheter Mobility/therapy needs: Bedrest Daily labs: CBC, BMP twice weekly Code Status: Full Disposition: ICU.   Goals of Care:  Last date of multidisciplinary goals of care discussion: 2/17 Family and staff present: Multiple family members, Wadie Lessen with PMT, Dr. Leonie Man, Dr. Cyndy Freeze, and Dr. Ander Slade Summary of discussion: Per chart review PMT met with family 2/17 with no decision reached  Follow up goals of care discussion due: 2/2 Code Status: Full  Day 14 on vent. Family have been asked to decide goals of care. WD and comfort care vs Trach and PEG , long term care. There have been multiple family conferences . Difficult family dynamics as second marriage , with essentially 2 families. Have decided to continue aggressive care.  Plans for trach/peg 2/24   CRITICAL CARE Performed by: Darlina Sicilian  Total critical care time: 33 minutes  Critical care time was exclusive of separately billable procedures and treating other patients.  Critical care was necessary to treat or prevent  imminent or life-threatening deterioration.  Critical care was time spent personally by me on the following activities: development of treatment plan with patient and/or surrogate as well as nursing, discussions with  consultants, evaluation of patient's response to treatment, examination of patient, obtaining history from patient or surrogate, ordering and performing treatments and interventions, ordering and review of laboratory studies, ordering and review of radiographic studies, pulse oximetry and re-evaluation of patient's condition.  Nickolas Madrid, NP Pulmonary/Critical Care Medicine  09/10/2020  8:42 AM

## 2020-09-10 NOTE — Procedures (Signed)
Percutaneous Tracheostomy Procedure Note   Charlene [name]  [id number]  02/01/1966  Date:09/10/20  Time:4:58 PM   Provider Performing:Mekala Winger  Procedure: Percutaneous Tracheostomy with Bronchoscopic Guidance (72620)  Indication(s) Prolonged mechanical ventilation  Consent Risks of the procedure as well as the alternatives and risks of each were explained to the patient and/or caregiver.  Consent for the procedure was obtained.  Anesthesia Etomidate, Versed, Fentanyl, Vecuronium  Time Out Verified patient identification, verified procedure, site/side was marked, verified correct patient position, special equipment/implants available, medications/allergies/relevant history reviewed, required imaging and test results available.  Sterile Technique Maximal sterile technique including sterile barrier drape, hand hygiene, sterile gown, sterile gloves, mask, hair covering.  Procedure Description Appropriate anatomy identified by palpation.  Patient's neck prepped and draped in sterile fashion.  1% lidocaine with epinephrine was used to anesthetize skin overlying neck.  1.5cm incision made and blunt dissection performed until tracheal rings could be easily palpated.   Then a size 6 Shiley tracheostomy was placed under bronchoscopic visualization using usual Seldinger technique and serial dilation.   Bronchoscope confirmed placement above the carina.  Tracheostomy was sutured in place with adhesive pad to protect skin under pressure.    Patient connected to ventilator.   Complications/Tolerance None; patient tolerated the procedure well. Chest X-ray is ordered to confirm no post-procedural complication.   EBL Minimal   Specimen(s) None   Lynnell Catalan, MD [facility] ICU Physician [facility]  Pager: [phone] Or Epic Secure Chat After hours: [phone].  09/10/2020, 5:00 PM

## 2020-09-11 ENCOUNTER — Encounter (HOSPITAL_COMMUNITY): Payer: Self-pay | Admitting: General Surgery

## 2020-09-11 DIAGNOSIS — Z931 Gastrostomy status: Secondary | ICD-10-CM

## 2020-09-11 DIAGNOSIS — Z978 Presence of other specified devices: Secondary | ICD-10-CM

## 2020-09-11 LAB — URINALYSIS, ROUTINE W REFLEX MICROSCOPIC
Bilirubin Urine: NEGATIVE
Glucose, UA: NEGATIVE mg/dL
Hgb urine dipstick: NEGATIVE
Ketones, ur: NEGATIVE mg/dL
Leukocytes,Ua: NEGATIVE
Nitrite: NEGATIVE
Protein, ur: NEGATIVE mg/dL
Specific Gravity, Urine: 1.027 (ref 1.005–1.030)
pH: 6 (ref 5.0–8.0)

## 2020-09-11 LAB — CBC
HCT: 26.3 % — ABNORMAL LOW (ref 36.0–46.0)
HCT: 24.9 % — ABNORMAL LOW (ref 36.0–46.0)
Hemoglobin: 8.6 g/dL — ABNORMAL LOW (ref 12.0–15.0)
Hemoglobin: 8 g/dL — ABNORMAL LOW (ref 12.0–15.0)
MCH: 25.8 pg — ABNORMAL LOW (ref 26.0–34.0)
MCH: 25.7 pg — ABNORMAL LOW (ref 26.0–34.0)
MCHC: 32.7 g/dL (ref 30.0–36.0)
MCHC: 32.1 g/dL (ref 30.0–36.0)
MCV: 79 fL — ABNORMAL LOW (ref 80.0–100.0)
MCV: 80.1 fL (ref 80.0–100.0)
Platelets: 549 10*3/uL — ABNORMAL HIGH (ref 150–400)
Platelets: 570 10*3/uL — ABNORMAL HIGH (ref 150–400)
RBC: 3.33 MIL/uL — ABNORMAL LOW (ref 3.87–5.11)
RBC: 3.11 MIL/uL — ABNORMAL LOW (ref 3.87–5.11)
RDW: 16.1 % — ABNORMAL HIGH (ref 11.5–15.5)
RDW: 16.2 % — ABNORMAL HIGH (ref 11.5–15.5)
WBC: 7.5 10*3/uL (ref 4.0–10.5)
WBC: 8.7 10*3/uL (ref 4.0–10.5)
nRBC: 0 % (ref 0.0–0.2)
nRBC: 0 % (ref 0.0–0.2)

## 2020-09-11 LAB — BASIC METABOLIC PANEL
Anion gap: 9 (ref 5–15)
BUN: 31 mg/dL — ABNORMAL HIGH (ref 6–20)
CO2: 33 mmol/L — ABNORMAL HIGH (ref 22–32)
Calcium: 8.5 mg/dL — ABNORMAL LOW (ref 8.9–10.3)
Chloride: 101 mmol/L (ref 98–111)
Creatinine, Ser: 0.98 mg/dL (ref 0.44–1.00)
GFR, Estimated: >60 mL/min (ref >60)
Glucose, Bld: 170 mg/dL — ABNORMAL HIGH (ref 70–99)
Potassium: 4 mmol/L (ref 3.5–5.1)
Sodium: 143 mmol/L (ref 135–145)

## 2020-09-11 LAB — GLUCOSE, CAPILLARY
Glucose-Capillary: 157 mg/dL — ABNORMAL HIGH (ref 70–99)
Glucose-Capillary: 154 mg/dL — ABNORMAL HIGH (ref 70–99)
Glucose-Capillary: 141 mg/dL — ABNORMAL HIGH (ref 70–99)
Glucose-Capillary: 113 mg/dL — ABNORMAL HIGH (ref 70–99)
Glucose-Capillary: 138 mg/dL — ABNORMAL HIGH (ref 70–99)
Glucose-Capillary: 155 mg/dL — ABNORMAL HIGH (ref 70–99)

## 2020-09-11 LAB — LACTIC ACID, PLASMA: Lactic Acid, Venous: 1.2 mmol/L (ref 0.5–1.9)

## 2020-09-11 MED ORDER — ATORVASTATIN CALCIUM 40 MG PO TABS: 40.0000 mg | ORAL_TABLET | Freq: Every day | ORAL | Status: DC

## 2020-09-11 MED ORDER — LACTATED RINGERS IV BOLUS
250.0000 mL | Freq: Once | INTRAVENOUS | Status: AC
  Administered 2020-09-11: 250 mL via INTRAVENOUS

## 2020-09-11 MED ORDER — HYDROCHLOROTHIAZIDE 25 MG PO TABS: 25.0000 mg | ORAL_TABLET | Freq: Every day | ORAL | Status: DC

## 2020-09-11 MED ORDER — ATORVASTATIN CALCIUM 40 MG PO TABS
40.0000 mg | ORAL_TABLET | Freq: Every day | ORAL | Status: DC
  Administered 2020-09-11 – 2020-09-27 (×17): 40 mg
  Filled 2020-09-11 (×17): qty 1

## 2020-09-11 MED ORDER — AMLODIPINE BESYLATE 5 MG PO TABS: 5.0000 mg | ORAL_TABLET | Freq: Every day | ORAL | Status: DC

## 2020-09-11 NOTE — Progress Notes (Signed)
   09/11/20 1125  Vent Select  Invasive or Noninvasive Invasive  Adult Vent Y  Tracheostomy Shiley Flexible 6 mm Cuffed  Placement Date/Time: 09/10/20 1529   Placed By: ICU physician  Brand: Shiley Flexible  Size (mm): 6 mm  Style: Cuffed  Status Secured  Site Assessment Clean;Dry  Site Care  (Clean, no changes made at this time.)  Inner Cannula Care Changed/new  Ties Assessment Clean;Dry;Secure  Cuff pressure (cm) 30 cm  Tracheostomy Equipment at bedside Yes and checklist posted at head of bed  Adult Ventilator Settings  Vent Type Servo i  Humidity HME  Vent Mode (S)  PSV;CPAP  FiO2 (%) (S)  40 %  Pressure Support (S)  10 cmH20  PEEP (S)  5 cmH20  Adult Ventilator Measurements  Peak Airway Pressure 15 L/min  Mean Airway Pressure 8 cmH20  Resp Rate Spontaneous 21 br/min  Spont TV 436 mL  Measured Ve 8.6 mL  Auto PEEP 0 cmH20  Total PEEP 5 cmH20  SpO2 100 %  Adult Ventilator Alarms  Alarms On Y  Ve High Alarm 20 L/min  Ve Low Alarm 4 L/min  Resp Rate High Alarm 38 br/min  Resp Rate Low Alarm 8  PEEP Low Alarm 3 cmH2O  Press High Alarm 45 cmH2O  T Apnea 20 sec(s)  VAP Prevention  HOB> 30 Degrees Y  Daily Weaning Assessment  Daily Assessment of Readiness to Wean Wean protocol criteria met (SBT performed)  SBT Method  (PSV 10/5)  Weaning Start Time (S)  1130  Patient response Passed (Tolerated well)  Breath Sounds  Bilateral Breath Sounds Clear;Diminished  Vent Respiratory Assessment  Patient Effort Good  Level of Consciousness Unresponsive  Respiratory Pattern Regular;Unlabored  Patient Tolerance Tolerated well  Suction Method  Respiratory Interventions Oral suction  Oral Suctioning/Secretions  Suction Type Oral  Suction Device Yankauer  Secretion Amount Small  Secretion Color Clear  Secretion Consistency Thin  Suction Tolerance Tolerated well  Suctioning Adverse Effects None  Airway Suctioning/Secretions  Suction Type  (Not indicated at this time. Blood  previously when suctioned.)

## 2020-09-11 NOTE — Progress Notes (Signed)
Nutrition Follow-up  DOCUMENTATION CODES:   Not applicable  INTERVENTION:   Tube feeding via PEG tube: Osmolite 1.2 at 55 ml/h (1320 ml per day) 45 ml ProSource TF daily  Provides 1624 kcal, 84 gm protein, 1070 ml free water daily  100 ml free water every 4 hours    NUTRITION DIAGNOSIS:   Inadequate oral intake related to inability to eat as evidenced by NPO status. Ongoing.   GOAL:   Patient will meet greater than or equal to 90% of their needs Met with TF.   MONITOR:   TF tolerance,Vent status  REASON FOR ASSESSMENT:   Consult,Ventilator Enteral/tube feeding initiation and management  ASSESSMENT:   Pt with PMH of HTN with medication noncompliance admitted with hypertensive thalamic ICH.   Pt discussed during ICU rounds and with RN.  Pt with hypertensive R thalamic/basal ganglia ICH. Per MD unresponsive to verbal stimuli with extensor posturing to stimulation. Prognosis poor for meaningful recovery is poor.    2/10 s/p EVD placement and intubation for respiratory arrest 2/13 EVD removed 2/17 palliative care consult; family requesting tx to [facility]  2/24 s/p trach and PEG   Patient is currently intubated on ventilator support MV: 8 L/min Temp (24hrs), Avg:99.8 F (37.7 C), Min:98.3 F (36.8 C), Max:101.3 F (38.5 C)  Medications reviewed  Labs reviewed:  CBG's: 141-157   Diet Order:   Diet Order            Diet NPO time specified  Diet effective midnight                 EDUCATION NEEDS:   No education needs have been identified at this time  Skin:  Skin Assessment: Reviewed RN Assessment  Last BM:  400 ml via rectal tube  Height:   Ht Readings from Last 1 Encounters:  08/27/20 '5\' 5"'  (1.651 m)    Weight:   Wt Readings from Last 1 Encounters:  09/11/20 64.7 kg    Ideal Body Weight:  56.8 kg  BMI:  Body mass index is 23.74 kg/m.  Estimated Nutritional Needs:   Kcal:  1600  Protein:  75-90 grams  Fluid:  >1.5  L/day  Lockie Pares., RD, LDN, CNSC See AMiON for contact information

## 2020-09-11 NOTE — Progress Notes (Addendum)
STROKE TEAM PROGRESS NOTE   INTERVAL HISTORY  Pt is evaluated at the bedside. No family is at the bedside. Pt had Trach and PEG placement yesterday. Pt strated bleeding from PEG placement site yesterday. Sutures were placed and bleeding stopped. On Exam, Trach in place, mouth guard in place. Pt still on vent, posturing of BUEs (Lt>RT)and withdraw of BLEs on pain stimulation. Some spontaneous movements  in b/l feet (Rt>Lt) present.  No significant neuro changes. Eyes in dysconjugate position. Dolls eyes's absent. Corneal reflex weak B/L. Some ventilatory effort present. BP has been labile and running on lower side, Systolic between 67-591 mmHg and diastolic between 63-846 mmHg . Creatinine 0.98, wbc 8.7, Hb  8.0. Pt is Afebrile. On Ancef for sinusitis. Restart Hydrochlorothiazide 25 mg Daily and amlodipine 5  Mg daily.    Vitals:   09/11/20 0600 09/11/20 0700 09/11/20 0800 09/11/20 0826  BP: 109/71 (!) 91/57 (!) 101/58   Pulse: 100 (!) 101 (!) 104 (!) 105  Resp: 18 14 15 14   Temp:   99.6 F (37.6 C)   TempSrc:   Axillary   SpO2: 100% 100% 100% 100%  Weight:      Height:       CBC:  Recent Labs  Lab 09/10/20 2328 09/11/20 0549  WBC 7.5 8.7  HGB 8.6* 8.0*  HCT 26.3* 24.9*  MCV 79.0* 80.1  PLT 549* 570*   Basic Metabolic Panel:  Recent Labs  Lab 09/09/20 0438 09/11/20 0549  NA 140 143  K 3.8 4.0  CL 95* 101  CO2 33* 33*  GLUCOSE 147* 170*  BUN 31* 31*  CREATININE 0.97 0.98  CALCIUM 9.1 8.5*  MG 2.4  --    Lipid Panel:  No results for input(s): CHOL, TRIG, HDL, CHOLHDL, VLDL, LDLCALC in the last 168 hours. HgbA1c:  No results for input(s): HGBA1C in the last 168 hours. Urine Drug Screen:  No results for input(s): LABOPIA, COCAINSCRNUR, LABBENZ, AMPHETMU, THCU, LABBARB in the last 168 hours.  Alcohol Level  No results for input(s): ETH in the last 168 hours.  IMAGING past 24 hours DG Chest Port 1 View  Result Date: 09/10/2020 CLINICAL DATA:  Post tracheostomy EXAM:  PORTABLE CHEST 1 VIEW COMPARISON:  Portable exam 1618 hours compared to 09/09/2020 FINDINGS: Tracheostomy tube projects over tracheal air column with tip approximately 3.4 cm above carina. Normal heart size, mediastinal contours, and pulmonary vascularity. Rotated to the RIGHT. Lungs clear. No acute infiltrate, pleural effusion, or pneumothorax. IMPRESSION: New tracheostomy tube without acute abnormalities. Electronically Signed   By: 09/11/2020 M.D.   On: 09/10/2020 16:39    PHYSICAL EXAM  Temp:  [97.6 F (36.4 C)-99.9 F (37.7 C)] 99.6 F (37.6 C) (02/25 0800) Pulse Rate:  [85-113] 105 (02/25 0826) Resp:  [10-27] 14 (02/25 0826) BP: (83-213)/(57-134) 101/58 (02/25 0800) SpO2:  [92 %-100 %] 100 % (02/25 0826) FiO2 (%):  [30 %-50 %] 40 % (02/25 0826) Weight:  [64.7 kg] 64.7 kg (02/25 0500)  General - Well nourished, well developed, trach and PEG  in place, Still  intubated off sedation.  Ophthalmologic - fundi not visualized due to noncooperation.  Cardiovascular - Regular rhythm and tachycardia.  Neuro - Trach in place, intubated off sedation, eyes closed, not following commands. With forced eye opening, left eye in downward position, right eye mid position, left pupil 1.81mm, right 1mm, not reactive to light, doll's eyes absent, not tracking. Corneal reflex weakly present bilaterally, gag and cough present. Some breathing  effort present. Facial symmetrical. Mouth guard in place. On pain stimulation, BUE extension posturing (Lt >Rt), BLE mild withdraw. Some spontaneous movements in b/l feet (Rt>Lt) present. Bilateral positive babinski. Sensation, coordination and gait not tested.  ASSESSMENT/PLAN  Ms. HANNA RA is a 55 y.o. female with history of hypertension with difficulty maintaining blood pressures and noncompliance to medications for various reasons, presented to the emergency room at [facility] for left-sided weakness and headaches.  She was appear to be  confused and lethargic. She was seen by telemedicine evaluation by neurologist for concern for stroke and a noncontrast head CT showed a right thalamic/basal ganglia hemorrhage 3.2 x 2.1 x 2.5  with intraventricular extension.  He was transferred ED to ED for inpatient neurological evaluation and admission. BP high at  178/109 mmHg on Admission. CTA head & neck showed No intracranial large vessel occlusion or proximal high-grade arterial stenosis. 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 Repeat CT Head on 08/27/20 showed- Stable parenchymal hemorrhage in the right thalamus and basal ganglia with intraventricular extension. ECHO LVEF 60-65%. mild concentric left ventricular hypertrophy. HgbA1c 6.0. LDL 167. Repeat CT head (08/27/20 )showed Stable parenchymal hemorrhage in the right thalamus and basal ganglia with intraventricular extension. S/P ventricular catheter placement on 08/27/2020. Following that Pt had labored breathing so intubated. CT head repeat post procedure didn't demonstrate any increase in size of hemorrhage.  3% normal saline was stopped. PT recommends SNF. MRA normal, MRI showed scattered b/l hippocampus, posterior CC, bilateral thalamus, and upper pons. Pt is on Vanco and Cefipime due to fever but stopped after negative cultures. Cultures negative until now. Goals of care discussed with family by Attending  and palliative care. Family meeting held on 09/03/20. Palliative care and CCM on board. Cefazolin for sinusitis started on 09/05/20 . Repeat CT showed Expected evolution of intracranial hemorrhage.  Multiple areas of low attenuation corresponding to some of the infarcts seen on prior MRI. Family requested Trach and PEG. Trach and PEG placed on 2/24.   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   CT Head -  Right thalamic ICH with IVH in the third and fourth ventricles and nearly filling the right lateral  ventricle. No hydrocephalus.   CTA head & neck - No intracranial large vessel occlusion or proximal high-grade arterial stenosis. 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   Repeat CT Head x 3 - Stable parenchymal hemorrhage in the right thalamus and basal ganglia with intraventricular extension as described.   2D ECHO LVEF 60-65%. mild concentric left ventricular hypertrophy  Repeat CT 09/07/20- Expected evolution of intracranial hemorrhage.  Multiple areas of low attenuation corresponding to some of the infarcts seen on prior MRI.  LDL 167  HgbA1c 6.0  Vitamin B12 301  VTE prophylaxis -  Lovenox  Trach and PEG placed on 2/24.  No antithrombotic prior to admission, now on No antithrombotic due to ICH.   Therapy recommendations: SNF  Disposition: husband is the POA, consented for trach and PEG. Trach and PEG placed on 2/24.   Slight obstructive hydrocephalus  CT Head -  IVH in the third and fourth ventricles and nearly filling the right lateral ventricle.  Ventricle size slightly larger than her baseline CT in 04/2020  Repeat CT head x 3 stable ventricle size.   NSG on board  S/P ventricular catheter placement on 08/27/2020. Neurosurgery clamped  EVD 08/29/20 and dc on 08/30/20  Stroke - scattered b/l hippocampus, posterior CC, bilateral thalamus, and upper pons - likely due to deep penetrating vessel compression by focal mass-effect  MRI brain 2/11 right greater than left mesial temporal lobes / parahippocampal cortex, bilateral basal ganglia, and possibly the upper pons that are concerning for acute infarcts, most likely secondary to mass effect from the hemorrhage.  MRA Normal  EEG No seizures or epileptiform discharges  Off 3% saline   MRI repeat Acute to early subacute infarcts in the bilateral cerebral white matter and right cerebellum, new from 08/28/2020. Interval evolution of multiple other infarcts present on the prior  MRI. 3. Unchanged size of large hemorrhage centered in the right thalamus with extension into the midbrain. Unchanged 6 mm of leftward midline shift.  Respiratory failure  Intubated on vent  Vent dependent  Family requested Trach   Trach placed 2/24.  Dysphagia   TF with cortrak  Family requested PEG  PEG placed on 2/24.   Hypertensive emergency hypotension  Home meds: Coreg 6.25 mg and Hydrochlorothiazide 25 mg daily (Non complaint) . BP goal SP <160 mmHG  . Long-term BP goal normotensive . on Coreg 12.5  . Hydrochlorothiazide 25 mg Daily and amlodipine 5  Mg stopped on 2/23 because of low BP  . BP still labile   Hyperlipidemia  Home meds: None   LDL 167, goal < 70  Put on lipitor 40  Continue statin at discharge  Anemia   Hb 7.4->7.1>PRBC->9.5>9.5>8.0  S/p PRBC 2/20  CCM on board  CBC monitoring  Monitor and transfuse per protocol.  Fever   ? Sinusitis   Vancomycin and Cefipime -> Ancef (09/05/20)  Temp 101.1->100.3->100.6->100.8>99.7>99.8>98.0>99.1>101.3  WBC 13.0 >10.1>11.4->12.1>10.3>7.3>8.7  Bld Cultures -1/2 contaminant.   UA neg  CXR neg  Buspar started 2/20  Other Stroke Risk Factors  Noncompliance   Hospital day #15   This patient is critically ill due to ICH, IVH, stroke, respiratory failure, fever, unresponsive and at significant risk of neurological worsening, death form recurrent ICH, recurrent stroke, cerebral edema, brain herniation, vegetative state, pneumonia. This patient's care requires constant monitoring of vital signs, hemodynamics, respiratory and cardiac monitoring, review of multiple databases, neurological assessment, discussion with family, other specialists and medical decision making of high complexity. I spent 30 minutes of neurocritical care time in the care of this patient. Discussed with CCM Dr. Celine Mans, no specific additional recommendations from neuro standpoint, will switch to CCM service as primary.  Neurology will follow peripherally, please feel free to call with any questions.   Marvel Plan, MD PhD Stroke Neurology 09/11/2020 4:27 PM   To contact Stroke Continuity provider, please refer to [email or web address]. After hours, contact General Neurology

## 2020-09-11 NOTE — Progress Notes (Signed)
Brief progress note  Nursing sent chat message. New fever and labile BP. Will culture blood and sputum, get UA. She may need peripheral pressors.  Will check lactate.   Bevelyn Ngo, MSN, AGACNP-BC [facility] See Amion for personal pager PCCM on call pager [phone] 09/11/2020 1:59 PM

## 2020-09-11 NOTE — Progress Notes (Signed)
MD Jerel Shepherd and NP S. Groce made aware of labile BP, temp 101.3, and low UOP. Orders placed by team.

## 2020-09-11 NOTE — Progress Notes (Signed)
   09/11/20 1205  Vent Select  Invasive or Noninvasive Invasive  Adult Vent Y  Adult Ventilator Settings  Vent Type Servo i  Vent Mode PRVC  Vt Set 450 mL  Set Rate 14 bmp  FiO2 (%) 40 %  I Time 0.91 Sec(s)  PEEP 5 cmH20  Adult Ventilator Measurements  Peak Airway Pressure 16 L/min  Mean Airway Pressure 7 cmH20  Resp Rate Spontaneous 0 br/min  Resp Rate Total 14 br/min  Exhaled Vt 458 mL  Measured Ve 6.6 mL  I:E Ratio Measured 1:3.7  Total PEEP 5 cmH20  SpO2 100 %  Adult Ventilator Alarms  Alarms On Y  Ve High Alarm 20 L/min  Ve Low Alarm 4 L/min  Resp Rate High Alarm 38 br/min  Resp Rate Low Alarm 8  PEEP Low Alarm 3 cmH2O  Press High Alarm 45 cmH2O  T Apnea 20 sec(s)  VAP Prevention  HOB> 30 Degrees Y  Daily Weaning Assessment  Reason SBT Terminated (S)   (Ended PSV trials due to apnea vent. alarm, 35 mins. PSV 10/5.)  Breath Sounds  Bilateral Breath Sounds Clear;Diminished  Vent Respiratory Assessment  Level of Consciousness Unresponsive  Nasal Suctioning/Secretions  Suction Type Nasal  Suction Device Catheter  Secretion Amount Moderate  Secretion Color Yellow;White  Secretion Consistency Thick  Suction Tolerance Tolerated well  Suctioning Adverse Effects None

## 2020-09-11 NOTE — Progress Notes (Signed)
NAME:  Charlene [name], MRN:  [id number], DOB:  1965/12/24, LOS: 8 ADMISSION DATE:  08/26/2020, CONSULTATION DATE:  2/10 REFERRING MD:  Dr. Erlinda Hong, CHIEF COMPLAINT:  ICH   Brief History:  55 year old female admitted with hypertensive R thalamic/basal ganglia ICH. Resultant obstructive hydrocephalus prompting EVD placement 2/10. Subsequently, 2/10 she had acute mental status change to obtundation and respiratory arrest resulting in intubation.  Past Medical History:  HTN, medication noncompliance  Significant Hospital Events:  2/09 Admit for ICH 2/10 EVD placed, later intubated due to obtundation and respiratory arrest 2/11 MRI Brain, hypertonic saline d/c'd, EVD not working.  Husband did not consent to PICC  2/13 EVD removed 2/17 GOC discussion with family, PMT  Consults:  Stroke PCCM Neurosurgery  Procedures:  ETT 2/10 >> EVD 2/10 > non functioning 2/11 at 1500; clamped 2/12 am > 2/13 Trach and diagnostic Bronchoscopy 2/24>> Dr. Lynetta Mare PEG placement 2/24>> Dr. Kieth Brightly  Significant Diagnostic Tests:  CT Head 2/9 >> Acute hemorrhage with the epicenter in the right thalamus. Measurement is approximately 3.2 x 2.1 x 2.5 cm (volume 8.8 cm^3). Intraventricular penetration with blood filling the third and fourth ventricles and nearly filling the right lateral ventricle. No hydrocephalus. CTA Head/Neck 2/10 >> Unchanged size of intraparenchymal hemorrhage centered in the right thalamus and basal ganglia with extension into the right lateral ventricle. 2. No intracranial arterial occlusion or high-grade stenosis. No dissection, aneurysm or hemodynamically significant stenosis of the carotid or vertebral arteries. CT Head 2/10 >> Stable parenchymal hemorrhage in the right thalamus and basal ganglia with intraventricular extension as described. No new focal abnormality is noted. CT Head 2/10 (post intubation) >> redemonstrated intraparenchymal hemorrhage centered within the R thalamus/basal  ganglia extending into the cerebral peduncle/midbrain with associated intraventricular extension and hemorrhage in the R lateral ventricle and fourth ventricle, overall not significantly changed from prior; stable mass effect, 11m of R to L midline shift, postsurgical changes of ventriculostomy TTE 2/10 >> LVEF 60 to 65%, LV has normal function, no regional wall motion abnormalities, mild concentric LVH.  RV systolic function is normal, normal pulmonary artery systolic pressure.  MRA head 2/11 >>Negative intracranial MR angiography. No evidence of aneurysm or high flow vascular malformation to explain the intraparenchymal hemorrhage  MRI brain 2/11 >> Similar intraparenchymal hemorrhage centered within the right thalamus and basal ganglia extending into the right midbrain with intraventricular extension, effacement of the prepontine cistern and similar 4 mm of leftward midline shift; separate areas of restricted diffusion and edema involving the R > L mesial temporal lobes/parahippocampal cortex, bilateral basal ganglia, and possibly the upper pons that are c/f acute infarcts, most likely secondary to mass effect from hemorrhage. MRI brain 2/19 >> intraventricular clot has decreased in size but mid-brain parenchymal clot has not and there are more ischemic infarcts in the surrounding brain.  CT Head WO Contrast 09/07/20>> Expected evolution of intracranial hemorrhage since 08/30/2020 head CT. Multiple areas of low attenuation corresponding to some of the infarcts seen on prior MRI. Ventricle caliber similar to prior MRI   Micro Data:  2/9 COVID + Influenza negative 2/10 MRSA PCR negative 2/12 BCx 2 > no growth  2/19 BC 1/2 +Stap EPI, 1/2 no growth   Antimicrobials:  Cefepime 2/12 > 2/15 Vanc 2/12 > 2/15  Interim History / Subjective:  Trach and PEG 2/24 Bleeding from [place]2/24 overnight>> HGB drop 0.5 grams Sutured by Dr. WRedmond Pulling2/24 at 11 pm  with resolution of bleed Site is clean , dry and  intact Biting her tongue, bite block placed at shift change Net + 13 L ( 650 cc's UO last 24) Urine appears concentrated T Max 99.9, WBC 8.7   Objective   Blood pressure (!) 101/58, pulse (!) 104, temperature 99.9 F (37.7 C), temperature source Oral, resp. rate 15, height _0  (1.651 m), weight 64.7 kg, last menstrual period 03/03/2014, SpO2 100 %, unknown if currently breastfeeding.    Vent Mode: PRVC FiO2 (%):  [30 %-50 %] 40 % Set Rate:  [14 bmp] 14 bmp Vt Set:  [450 mL] 450 mL PEEP:  [5 cmH20] 5 cmH20 Plateau Pressure:  [13 cmH20-16 cmH20] 16 cmH20   Intake/Output Summary (Last 24 hours) at 09/11/2020 0815 Last data filed at 09/11/2020 0800 Gross per 24 hour  Intake 3868.16 ml  Output 900 ml  Net 2968.16 ml   Filed Weights   09/06/20 0400 09/08/20 0442 09/11/20 0500  Weight: 65.5 kg 61 kg 64.7 kg   Examination: General: adult female, trached on [name] vent support , no sedation HEENT: L facial droop, otherwise NCAT, No LAD Neuro: severe left down-ward gaze on L, R gaze is midline, ,LLE, RLE  withdrawals, posturing noted to RLE, RUE . Biting ETT CV: RRR, no MRG PULM:  resps even non labored on vent, coarse, non assisting the vent, bloody secretions post trach  GI: distended, soft, active bowel sounds, PEG without drainage, Abdominal binder in place, TF infusing at 65 per hour Extremities: -edema , no obvious deformities noted Skin: dry/intact, warm   Resolved Hospital Problem list   Hypernatremia   Assessment & Plan:  Multiple acute strokes  - Per neuro "scattered b/l hippocampus, posterior CC, bilateral thalamus, and upper pons, likely due to deep penetrating vessel compression by focal mass-effect" Intraparenchymal hemorrhage with right thalamic hemorrhage with extension into the third fourth and right lateral ventricle -Secondary to HTN and medication noncompliance  Obstructive hydrocephalus -S/P EVD placement 2/10, subsequently clamped and removed 2/13 -S/P  3% P: Management per neurology  Maintain neuro protective measures; goal for eurothermia, euglycemia, eunatermia, normoxia, and PCO2 goal of 35-40 Nutrition and bowel regiment  Seizure precautions  Aspirations precautions   Hypertensive emergency -Home medications include Coreg and HCTZ P:  SBP goal <160 Continue Coreg,prn labetalol PRN IV antihypertensives  Closely monitor hemodynamics in the ICU setting   Acute hypoxemic respiratory failure secondary to CVA P: Vent support - 8cc/kg  F/u CXR  F/u ABG Trached 2/24 SBT as tol, ATC trials post trach VAP bundle in place   Central fever  P: Buspirone started 2/20 Trend CBC and fever curve   Possible bacterial sinusitis -Extensive diffuse paranasal sinus opacification and moderate mastoid effusions in the setting of intubation. Tan secretions per ETT P: Currently on Ancef (Stop date in place)   Anemia P: Trend CBC  Monitor for bleeding Transfuse per protocol     Daily Goals Checklist  Pain/Anxiety/Delirium protocol (if indicated): None Neuro vitals: every 4 hours VAP protocol (if indicated): Bundle in place Respiratory support goals: pressure support as tolerated. DVT prophylaxis: Enoxaparin Nutrition Status: Tolerating tube feeds GI prophylaxis: Protonix Urinary catheter: External catheter Mobility/therapy needs: Bedrest Daily labs: CBC, BMP twice weekly Code Status: [name] Disposition: ICU.   Goals of Care:  Last date of multidisciplinary goals of care discussion: 2/23 Family and staff present: Multiple family members, Wadie Lessen with PMT, Dr. Erlinda Hong, and Dr. Lynetta Mare Summary of discussion: Per chart review PMT met with family 2/13 with decision for trach and PEG  Follow  up goals of care discussion due: 3/2 Code Status: [name]  Day 15 on vent. Family have been asked to decide goals of care. WD and comfort care vs Trach and PEG , long term care. There have been multiple family conferences . Difficult family  dynamics as second marriage , with essentially 2 families. Have decided to continue aggressive care.  Trached and PEG placement   CRITICAL CARE Performed by: Magdalen Spatz  Total critical care time: 35 minutes  Critical care time was exclusive of separately billable procedures and treating other patients.  Critical care was necessary to treat or prevent imminent or life-threatening deterioration.  Critical care was time spent personally by me on the following activities: development of treatment plan with patient and/or surrogate as well as nursing, discussions with consultants, evaluation of patient's response to treatment, examination of patient, obtaining history from patient or surrogate, ordering and performing treatments and interventions, ordering and review of laboratory studies, ordering and review of radiographic studies, pulse oximetry and re-evaluation of patient's condition.  Magdalen Spatz, MSN, AGACNP-BC [place] for personal pager 09/11/2020  8:15 AM

## 2020-09-11 NOTE — Evaluation (Signed)
Physical Therapy Evaluation Patient Details Name: Charlene [name] MRN: [id number] DOB: [birth year] Today's Date: 09/11/2020   History of Present Illness  55 yo female [facility] on 2/9 with L sided weakness and headaches. PMH includes anxiety, HTN. 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 s/p Right frontal ventricular catheter placement on 2/10, stopped functioning on 2/11.  MRI 2/11 shows right greater than left mesial temporal lobes / parahippocampal cortex, bilateral basal ganglia, and possibly the upper pons that are concerning for acute infarcts, most likely secondary to mass effect from the hemorrhage. ETT 2/10-present.  Clinical Impression  This is a re order for this unfortunate lady who has suffered multiple intracerebral bleed and infarcts.  Pt is not responsive, non purposeful in her movements.  She appears to be mildly to moderately posturing.  We will stay in to get guidance on the role that PT needs to play given that she has been deemed to have a poor prognosis.  At this time we will continue with the same low level goals as set on 08/30/20.     Follow Up Recommendations SNF;Supervision/Assistance - 24 hour    Equipment Recommendations  Wheelchair (measurements PT);Wheelchair cushion (measurements PT);Hospital bed    Recommendations for Other Services       Precautions / Restrictions Precautions Precautions: Fall;Other (comment) (trach PEG)      Mobility  Bed Mobility Overal bed mobility: Needs Assistance Bed Mobility: Rolling Rolling: Total assist         General bed mobility comments: No purposeful movement.  Moderate extensor tone overall.    Transfers                    Ambulation/Gait             General Gait Details: PROM only  Stairs            Wheelchair Mobility    Modified Rankin  (Stroke Patients Only) Modified Rankin (Stroke Patients Only) Modified Rankin: Severe disability     Balance                                             Pertinent Vitals/Pain Faces Pain Scale: No hurt Pain Location: non consistent w/d to pain x 4 ext. Pain Intervention(s): Monitored during session    Home Living Family/patient expects to be discharged to:: Skilled nursing facility Living Arrangements: Spouse/significant other;Children Available Help at Discharge: Family                  Prior Function Level of Independence: Independent               Hand Dominance   Dominant Hand: Right    Extremity/Trunk Assessment   Upper Extremity Assessment Upper Extremity Assessment: RUE deficits/detail;LUE deficits/detail RUE Deficits / Details: Full PROM, resting in moderately strong extension.  More extension with PROM of opposite UE LUE Deficits / Details: Full PROM.  Moderate extension postion at rest.  Greater extension with PROM of opposite UE.    Lower Extremity Assessment Lower Extremity Assessment: RLE deficits/detail;LLE deficits/detail RLE Deficits / Details: Full PROM, reflexive movement, no spontaneous movement noted, no consistent w/d to pain. LLE Deficits / Details: Full PROM, reflexive movement, no spontaneous movement noted, no consistent  w/d to pain.       Communication   Communication: Other (comment) (pt unresponsive)  Cognition Arousal/Alertness:  (unresponsive) Behavior During Therapy: Flat affect Overall Cognitive Status: Difficult to assess                                        General Comments      Exercises General Exercises - Upper Extremity Shoulder Flexion: PROM Elbow Flexion: PROM Elbow Extension: PROM Wrist Flexion: PROM Wrist Extension: PROM General Exercises - Lower Extremity Heel Slides: PROM Hip ABduction/ADduction: PROM Other Exercises Other Exercises: PROM to LE, Heel Cord  stretch.   Assessment/Plan    PT Assessment Patient needs continued PT services (Will stay in to determine what role PT is being asked to "play" in this situation based on poor prognosis.)  PT Problem List Decreased strength;Decreased mobility;Decreased cognition;Impaired tone;Decreased activity tolerance;Decreased balance;Decreased coordination;Decreased safety awareness       PT Treatment Interventions Therapeutic activities;Therapeutic exercise;Neuromuscular re-education;Patient/family education    PT Goals (Current goals can be found in the Care Plan section)  Acute Rehab PT Goals Patient Stated Goal: pt unable, non responsive PT Goal Formulation: Patient unable to participate in goal setting Time For Goal Achievement: 09/25/20 Potential to Achieve Goals: Poor    Frequency Min 1X/week   Barriers to discharge        Co-evaluation               AM-PAC PT "6 Clicks" Mobility  Outcome Measure Help needed turning from your back to your side while in a flat bed without using bedrails?: Total Help needed moving from lying on your back to sitting on the side of a flat bed without using bedrails?: Total Help needed moving to and from a bed to a chair (including a wheelchair)?: Total Help needed standing up from a chair using your arms (e.g., wheelchair or bedside chair)?: Total Help needed to walk in hospital room?: Total Help needed climbing 3-5 steps with a railing? : Total 6 Click Score: 6    End of Session Equipment Utilized During Treatment: Oxygen Activity Tolerance: Treatment limited secondary to medical complications (Comment) (?Decerebrate vs extensor tone) Patient left: in bed;with call bell/phone within reach;with bed alarm set;with SCD's reapplied Nurse Communication: Mobility status PT Visit Diagnosis: Hemiplegia and hemiparesis;Other symptoms and signs involving the nervous system (R29.898);Other abnormalities of gait and mobility (R26.89) Hemiplegia - caused  by: Cerebral infarction;Nontraumatic intracerebral hemorrhage;Other Nontraumatic intracranial hemorrhage    Time: 7482-7078 PT Time Calculation (min) (ACUTE ONLY): 18 min   Charges:   PT Evaluation $PT Eval High Complexity: 1 High          09/11/2020  Jacinto Halim., PT Acute Rehabilitation Services [phone]  (pager) [phone]  (office)  Eliseo Gum Jerimiah Wolman 09/11/2020, 5:57 PM

## 2020-09-11 NOTE — Progress Notes (Signed)
1 Day Post-Op  Subjective: Patient had bleeding around PEG site overnight, stopped after sutures placed. No signs of bleeding this morning. Slight downtrend in hgb from 9 to 8.6 to 8.0. Patient is hemodynamically stable this morning.   Objective: Vital signs in last 24 hours: Temp:  [97.6 F (36.4 C)-99.9 F (37.7 C)] 99.9 F (37.7 C) (02/25 0400) Pulse Rate:  [85-113] 105 (02/25 0826) Resp:  [10-27] 14 (02/25 0826) BP: (83-213)/(57-134) 101/58 (02/25 0800) SpO2:  [92 %-100 %] 100 % (02/25 0826) FiO2 (%):  [30 %-50 %] 40 % (02/25 0826) Weight:  [64.7 kg] 64.7 kg (02/25 0500) Last BM Date: 09/11/20 (pouch in place)  Intake/Output from previous day: 02/24 0701 - 02/25 0700 In: 3567.7 [I.V.:1056.8; NG/GT:1101.3; IV Piggyback:1409.6] Out: 1050 [Urine:650; Stool:400] Intake/Output this shift: Total I/O In: 405.5 [I.V.:150.6; NG/GT:210; IV Piggyback:44.9] Out: 0   PE: General: resting in bed, NAD HEENT: trach in place CV: RRR Abdomen: soft, nondistended. PEG in place LUQ, surrounding skin is clean and dry, no signs of active bleeding.  Lab Results:  Recent Labs    09/10/20 2328 09/11/20 0549  WBC 7.5 8.7  HGB 8.6* 8.0*  HCT 26.3* 24.9*  PLT 549* 570*   BMET Recent Labs    09/09/20 0438 09/11/20 0549  NA 140 143  K 3.8 4.0  CL 95* 101  CO2 33* 33*  GLUCOSE 147* 170*  BUN 31* 31*  CREATININE 0.97 0.98  CALCIUM 9.1 8.5*   PT/INR Recent Labs    09/10/20 0236  LABPROT 14.3  INR 1.2   CMP     Component Value Date/Time   NA 143 09/11/2020 0549   K 4.0 09/11/2020 0549   CL 101 09/11/2020 0549   CO2 33 (H) 09/11/2020 0549   GLUCOSE 170 (H) 09/11/2020 0549   BUN 31 (H) 09/11/2020 0549   CREATININE 0.98 09/11/2020 0549   CALCIUM 8.5 (L) 09/11/2020 0549   PROT 8.0 08/26/2020 2205   ALBUMIN 4.5 08/26/2020 2205   AST 18 08/26/2020 2205   ALT 13 08/26/2020 2205   ALKPHOS 66 08/26/2020 2205   BILITOT 0.3 08/26/2020 2205   GFRNONAA >60 09/11/2020 0549    GFRAA >90 02/21/2014 1218   Lipase  No results found for: LIPASE     Studies/Results: DG Chest Port 1 View  Result Date: 09/10/2020 CLINICAL DATA:  Post tracheostomy EXAM: PORTABLE CHEST 1 VIEW COMPARISON:  Portable exam 1618 hours compared to 09/09/2020 FINDINGS: Tracheostomy tube projects over tracheal air column with tip approximately 3.4 cm above carina. Normal heart size, mediastinal contours, and pulmonary vascularity. Rotated to the RIGHT. Lungs clear. No acute infiltrate, pleural effusion, or pneumothorax. IMPRESSION: New tracheostomy tube without acute abnormalities. Electronically Signed   By: Ulyses Southward M.D.   On: 09/10/2020 16:39    Anti-infectives: Anti-infectives (From admission, onward)   Start     Dose/Rate Route Frequency Ordered Stop   09/06/20 0600  vancomycin (VANCOREADY) IVPB 1250 mg/250 mL        1,250 mg 166.7 mL/hr over 90 Minutes Intravenous  Once 09/06/20 0541 09/06/20 0739   09/05/20 1400  ceFAZolin (ANCEF) IVPB 1 g/50 mL premix        1 g 100 mL/hr over 30 Minutes Intravenous Every 8 hours 09/05/20 1120 09/15/20 1359   08/30/20 0600  vancomycin (VANCOREADY) IVPB 500 mg/100 mL  Status:  Discontinued        500 mg 100 mL/hr over 60 Minutes Intravenous Every 12 hours 08/29/20  1554 09/01/20 1115   08/29/20 1700  ceFEPIme (MAXIPIME) 2 g in sodium chloride 0.9 % 100 mL IVPB  Status:  Discontinued        2 g 200 mL/hr over 30 Minutes Intravenous Every 12 hours 08/29/20 1554 09/01/20 1115   08/29/20 1645  vancomycin (VANCOREADY) IVPB 1250 mg/250 mL        1,250 mg 166.7 mL/hr over 90 Minutes Intravenous  Once 08/29/20 1554 08/29/20 1820       Assessment/Plan 55 yo female with hemorrhagic stroke, dysphagia. POD1 s/p PEG placement. - Bleeding resolved, continue to monitor - Continue tube feeds at goal - Keep abdominal binder in place at all times to prevent tube dislodgement - Surgery will continue to follow    LOS: 15 days    Sophronia Simas,  MD [facility] General, Hepatobiliary and Pancreatic Surgery 09/11/20 8:36 AM

## 2020-09-12 DIAGNOSIS — J0140 Acute pansinusitis, unspecified: Secondary | ICD-10-CM

## 2020-09-12 DIAGNOSIS — G931 Anoxic brain damage, not elsewhere classified: Secondary | ICD-10-CM

## 2020-09-12 LAB — MAGNESIUM: Magnesium: 2.1 mg/dL (ref 1.7–2.4)

## 2020-09-12 LAB — CBC
HCT: 20.3 % — ABNORMAL LOW (ref 36.0–46.0)
Hemoglobin: 6.7 g/dL — CL (ref 12.0–15.0)
MCH: 26.3 pg (ref 26.0–34.0)
MCHC: 33 g/dL (ref 30.0–36.0)
MCV: 79.6 fL — ABNORMAL LOW (ref 80.0–100.0)
Platelets: 467 10*3/uL — ABNORMAL HIGH (ref 150–400)
RBC: 2.55 MIL/uL — ABNORMAL LOW (ref 3.87–5.11)
RDW: 15.9 % — ABNORMAL HIGH (ref 11.5–15.5)
WBC: 9.1 10*3/uL (ref 4.0–10.5)
nRBC: 0 % (ref 0.0–0.2)

## 2020-09-12 LAB — BASIC METABOLIC PANEL
Anion gap: 9 (ref 5–15)
BUN: 25 mg/dL — ABNORMAL HIGH (ref 6–20)
CO2: 28 mmol/L (ref 22–32)
Calcium: 8.2 mg/dL — ABNORMAL LOW (ref 8.9–10.3)
Chloride: 103 mmol/L (ref 98–111)
Creatinine, Ser: 0.79 mg/dL (ref 0.44–1.00)
GFR, Estimated: >60 mL/min (ref >60)
Glucose, Bld: 162 mg/dL — ABNORMAL HIGH (ref 70–99)
Potassium: 3.8 mmol/L (ref 3.5–5.1)
Sodium: 140 mmol/L (ref 135–145)

## 2020-09-12 LAB — GLUCOSE, CAPILLARY
Glucose-Capillary: 102 mg/dL — ABNORMAL HIGH (ref 70–99)
Glucose-Capillary: 133 mg/dL — ABNORMAL HIGH (ref 70–99)
Glucose-Capillary: 137 mg/dL — ABNORMAL HIGH (ref 70–99)
Glucose-Capillary: 137 mg/dL — ABNORMAL HIGH (ref 70–99)
Glucose-Capillary: 138 mg/dL — ABNORMAL HIGH (ref 70–99)
Glucose-Capillary: 155 mg/dL — ABNORMAL HIGH (ref 70–99)

## 2020-09-12 LAB — HEMOGLOBIN AND HEMATOCRIT, BLOOD
HCT: 25.3 % — ABNORMAL LOW (ref 36.0–46.0)
Hemoglobin: 8.4 g/dL — ABNORMAL LOW (ref 12.0–15.0)

## 2020-09-12 LAB — PREPARE RBC (CROSSMATCH)

## 2020-09-12 MED ORDER — SODIUM CHLORIDE 0.9% IV SOLUTION: Freq: Once | INTRAVENOUS | Status: AC

## 2020-09-12 NOTE — Progress Notes (Signed)
   09/12/20 2343  Vent Select  Adult Vent  (pt. on trach collar vent on standby)  Tracheostomy Shiley Flexible 6 mm Cuffed  Placement Date/Time: 09/10/20 1529   Placed By: ICU physician  Brand: Shiley Flexible  Size (mm): 6 mm  Style: Cuffed  Status Secured  Site Assessment Clean;Dry  Site Care Cleansed;Dried;Dressing applied;Protective barrier to skin  Inner Cannula Care Changed/new;Cleansed/dried  Ties Assessment Clean;Dry;Secure  Cuff pressure (cm) 0 cm  Tracheostomy Equipment at bedside Yes and checklist posted at head of bed  Adult Ventilator Settings  FiO2 (%) 28 %  Adult Ventilator Measurements  SpO2 96 %  Breath Sounds  Bilateral Breath Sounds Rhonchi  R Upper  Breath Sounds Rhonchi  R Lower Breath Sounds Rhonchi  Vent Respiratory Assessment  Respiratory Pattern Regular;Unlabored  Vent is on stand-by pt.  on trach collar tolerating 5L 28% will continue to monitor.

## 2020-09-12 NOTE — Progress Notes (Signed)
NAME:  Charlene [name], MRN:  [id number], DOB:  [birth year], LOS: 69 ADMISSION DATE:  08/26/2020, CONSULTATION DATE:  2/10 REFERRING MD:  Dr. Erlinda Hong, CHIEF COMPLAINT:  ICH   Brief History:  55 year old female admitted with hypertensive R thalamic/basal ganglia ICH. Resultant obstructive hydrocephalus prompting EVD placement 2/10. Subsequently, 2/10 she had acute mental status change to obtundation and respiratory arrest resulting in intubation.  Past Medical History:  HTN, medication noncompliance  Significant Hospital Events:  2/09 Admit for ICH 2/10 EVD placed, later intubated due to obtundation and respiratory arrest 2/11 MRI Brain, hypertonic saline d/c'd, EVD not working.  Husband did not consent to PICC  2/13 EVD removed 2/17 GOC discussion with family, PMT  Consults:  Stroke PCCM Neurosurgery  Procedures:  ETT 2/10 >> EVD 2/10 > non functioning 2/11 at 1500; clamped 2/12 am > 2/13 Trach and diagnostic Bronchoscopy 2/24>> Dr. Lynetta Mare PEG placement 2/24>> Dr. Kieth Brightly  Significant Diagnostic Tests:  CT Head 2/9 >> Acute hemorrhage with the epicenter in the right thalamus. Measurement is approximately 3.2 x 2.1 x 2.5 cm (volume 8.8 cm^3). Intraventricular penetration with blood filling the third and fourth ventricles and nearly filling the right lateral ventricle. No hydrocephalus. CTA Head/Neck 2/10 >> Unchanged size of intraparenchymal hemorrhage centered in the right thalamus and basal ganglia with extension into the right lateral ventricle. 2. No intracranial arterial occlusion or high-grade stenosis. No dissection, aneurysm or hemodynamically significant stenosis of the carotid or vertebral arteries. CT Head 2/10 >> Stable parenchymal hemorrhage in the right thalamus and basal ganglia with intraventricular extension as described. No new focal abnormality is noted. CT Head 2/10 (post intubation) >> redemonstrated intraparenchymal hemorrhage centered within the R thalamus/basal  ganglia extending into the cerebral peduncle/midbrain with associated intraventricular extension and hemorrhage in the R lateral ventricle and fourth ventricle, overall not significantly changed from prior; stable mass effect, 49m of R to L midline shift, postsurgical changes of ventriculostomy TTE 2/10 >> LVEF 60 to 65%, LV has normal function, no regional wall motion abnormalities, mild concentric LVH.  RV systolic function is normal, normal pulmonary artery systolic pressure.  MRA head 2/11 >>Negative intracranial MR angiography. No evidence of aneurysm or high flow vascular malformation to explain the intraparenchymal hemorrhage  MRI brain 2/11 >> Similar intraparenchymal hemorrhage centered within the right thalamus and basal ganglia extending into the right midbrain with intraventricular extension, effacement of the prepontine cistern and similar 4 mm of leftward midline shift; separate areas of restricted diffusion and edema involving the R > L mesial temporal lobes/parahippocampal cortex, bilateral basal ganglia, and possibly the upper pons that are c/f acute infarcts, most likely secondary to mass effect from hemorrhage. MRI brain 2/19 >> intraventricular clot has decreased in size but mid-brain parenchymal clot has not and there are more ischemic infarcts in the surrounding brain.  CT Head WO Contrast 09/07/20>> Expected evolution of intracranial hemorrhage since 08/30/2020 head CT. Multiple areas of low attenuation corresponding to some of the infarcts seen on prior MRI. Ventricle caliber similar to prior MRI   Micro Data:  2/9 COVID + Influenza negative 2/10 MRSA PCR negative 2/12 BCx 2 > no growth  2/19 BC 1/2 +Stap EPI, 1/2 no growth   Antimicrobials:  Cefepime 2/12 > 2/15 Vanc 2/12 > 2/15  Interim History / Subjective:  No bleeding overnight from peg tube. afebrile  Objective   Blood pressure 101/65, pulse 96, temperature 98.2 F (36.8 C), temperature source Axillary, resp. rate  (!) 28, height _0  (  1.651 m), weight 68.3 kg, last menstrual period 03/03/2014, SpO2 100 %, unknown if currently breastfeeding.    Vent Mode: PSV;CPAP FiO2 (%):  [28 %-35 %] 28 % Set Rate:  [14 bmp] 14 bmp Vt Set:  [450 mL] 450 mL PEEP:  [5 cmH20] 5 cmH20 Pressure Support:  [5 cmH20] 5 cmH20 Plateau Pressure:  [15 cmH20] 15 cmH20   Intake/Output Summary (Last 24 hours) at 09/12/2020 1348 Last data filed at 09/12/2020 1200 Gross per 24 hour  Intake 3471.16 ml  Output 625 ml  Net 2846.16 ml   Filed Weights   09/08/20 0442 09/11/20 0500 09/12/20 0305  Weight: 61 kg 64.7 kg 68.3 kg   Examination: General: adult female, trached on full vent support , no sedation HEENT: bite block in place Neuro: does not follow commands, extensor posturing with stimulation CV: RRR, no MRG PULM: non labored respirations, no wheezes or crackles GI: soft, binder in place, PEG site CDI. Extremities: -edema , no obvious deformities noted Skin: dry/intact, warm   Resolved Hospital Problem list   Hypernatremia   Assessment & Plan:  Multiple acute strokes  - Per neuro "scattered b/l hippocampus, posterior CC, bilateral thalamus, and upper pons, likely due to deep penetrating vessel compression by focal mass-effect" Intraparenchymal hemorrhage with right thalamic hemorrhage with extension into the third fourth and right lateral ventricle -Secondary to HTN and medication noncompliance  Obstructive hydrocephalus -S/P EVD placement 2/10, subsequently clamped and removed 2/13 -S/P 3% P: Management per neurology  Maintain neuro protective measures; goal for eurothermia, euglycemia, eunatermia, normoxia, and PCO2 goal of 35-40 Nutrition and bowel regiment  Seizure precautions  Aspirations precautions   Hypertensive emergency -Home medications include Coreg and HCTZ P:  SBP goal <160 Continue Coreg,prn labetalol PRN IV antihypertensives  Closely monitor hemodynamics in the ICU setting   Acute  hypoxemic respiratory failure secondary to CVA P: Full Vent support with LTVV Trached 2/24 SBT as tol, ATC trials post trach. Will do TC trial today.  VAP bundle in place   Central fever  P: Buspirone started 2/20 Trend CBC and fever curve   Possible bacterial sinusitis -Extensive diffuse paranasal sinus opacification and moderate mastoid effusions in the setting of intubation. Tan secretions per ETT P: Currently on Ancef through 3/1  Anemia P: Trend CBC  Monitor for bleeding Transfuse per protocol     Daily Goals Checklist  Pain/Anxiety/Delirium protocol (if indicated): None Neuro vitals: every 4 hours VAP protocol (if indicated): Bundle in place Respiratory support goals: pressure support as tolerated. DVT prophylaxis: Enoxaparin Nutrition Status: Tolerating tube feeds GI prophylaxis: Protonix Urinary catheter: External catheter Mobility/therapy needs: Bedrest Daily labs: CBC, BMP twice weekly Code Status: Full Disposition: ICU. Based on decision for trach and peg, will need to discuss disposition for placement in SNF with social work. Apparently might need to go to SNF out of state.  Goals of Care:  Last date of multidisciplinary goals of care discussion: 2/23 Family and staff present: Multiple family members, Lorinda Creed with PMT, Dr. Roda Shutters, and Dr. Denese Killings Summary of discussion: Per chart review PMT met with family 2/13 with decision for trach and PEG  Follow up goals of care discussion due: 3/2 Code Status: Full  CRITICAL CARE Performed by: Charlott Holler   The patient is critically ill due to respiratory failure, encephalopathy.  Critical care was necessary to treat or prevent imminent or life-threatening deterioration.  Critical care was time spent personally by me on the following activities: development of treatment plan  with patient and/or surrogate as well as nursing, discussions with consultants, evaluation of patient's response to treatment, examination of  patient, obtaining history from patient or surrogate, ordering and performing treatments and interventions, ordering and review of laboratory studies, ordering and review of radiographic studies, pulse oximetry, re-evaluation of patient's condition and participation in multidisciplinary rounds.   Critical Care Time devoted to patient care services described in this note is 32 minutes. This time reflects time of care of this [place] . This critical care time does not reflect separately billable procedures or procedure time, teaching time or supervisory time of PA/NP/Med student/Med Resident etc but could involve care discussion time.       [facility] 09/12/2020 1:48 PM  Pager: see AMION After hours pager: [phone]  If no response to pager , please call [phone] until 7pm After 7:00 pm call Elink  [phone]

## 2020-09-12 NOTE — Progress Notes (Signed)
eLink Physician-Brief Progress Note Patient Name: Charlene [name] DOB: [birth year] MRN: [id number]   Date of Service  09/12/2020  HPI/Events of Note  Anemia - Hgb = 6.7.  eICU Interventions  Will transfuse 1 unit PRBC now.      Intervention Category Major Interventions: Other:  Charlene [name] 09/12/2020, 1:20 AM

## 2020-09-12 NOTE — Progress Notes (Signed)
2 Days Post-Op  Subjective: Patient had bleeding around PEG site overnight day of procedure, stopped with sutures by Dr. Andrey Campanile.  Objective: Vital signs in last 24 hours: Temp:  [98.6 F (37 C)-101.3 F (38.5 C)] 98.6 F (37 C) (02/26 0800) Pulse Rate:  [94-113] 109 (02/26 0910) Resp:  [10-30] 23 (02/26 0910) BP: (87-169)/(57-105) 151/101 (02/26 0910) SpO2:  [99 %-100 %] 100 % (02/26 0910) FiO2 (%):  [28 %-40 %] 28 % (02/26 0900) Weight:  [68.3 kg] 68.3 kg (02/26 0305) Last BM Date: 09/11/20 (pouch in place)  Intake/Output from previous day: 02/25 0701 - 02/26 0700 In: 3696.7 [I.V.:1205.2; Blood:417.5; NG/GT:1925; IV Piggyback:149] Out: 675 [Urine:675] Intake/Output this shift: No intake/output data recorded.  PE: General: resting in bed, NAD HEENT: trach in place CV: RRR Abdomen: soft, nondistended. PEG in place LUQ, surrounding skin is clean and dry, no signs of active bleeding.  Lab Results:  Recent Labs    09/11/20 0549 09/12/20 0037 09/12/20 0638  WBC 8.7 9.1  --   HGB 8.0* 6.7* 8.4*  HCT 24.9* 20.3* 25.3*  PLT 570* 467*  --    BMET Recent Labs    09/11/20 0549 09/12/20 0037  NA 143 140  K 4.0 3.8  CL 101 103  CO2 33* 28  GLUCOSE 170* 162*  BUN 31* 25*  CREATININE 0.98 0.79  CALCIUM 8.5* 8.2*   PT/INR Recent Labs    09/10/20 0236  LABPROT 14.3  INR 1.2   CMP     Component Value Date/Time   NA 140 09/12/2020 0037   K 3.8 09/12/2020 0037   CL 103 09/12/2020 0037   CO2 28 09/12/2020 0037   GLUCOSE 162 (H) 09/12/2020 0037   BUN 25 (H) 09/12/2020 0037   CREATININE 0.79 09/12/2020 0037   CALCIUM 8.2 (L) 09/12/2020 0037   PROT 8.0 08/26/2020 2205   ALBUMIN 4.5 08/26/2020 2205   AST 18 08/26/2020 2205   ALT 13 08/26/2020 2205   ALKPHOS 66 08/26/2020 2205   BILITOT 0.3 08/26/2020 2205   GFRNONAA >60 09/12/2020 0037   GFRAA >90 02/21/2014 1218   Lipase  No results found for: LIPASE     Studies/Results: DG Chest Port 1  View  Result Date: 09/10/2020 CLINICAL DATA:  Post tracheostomy EXAM: PORTABLE CHEST 1 VIEW COMPARISON:  Portable exam 1618 hours compared to 09/09/2020 FINDINGS: Tracheostomy tube projects over tracheal air column with tip approximately 3.4 cm above carina. Normal heart size, mediastinal contours, and pulmonary vascularity. Rotated to the RIGHT. Lungs clear. No acute infiltrate, pleural effusion, or pneumothorax. IMPRESSION: New tracheostomy tube without acute abnormalities. Electronically Signed   By: Ulyses Southward M.D.   On: 09/10/2020 16:39    Anti-infectives: Anti-infectives (From admission, onward)   Start     Dose/Rate Route Frequency Ordered Stop   09/06/20 0600  vancomycin (VANCOREADY) IVPB 1250 mg/250 mL        1,250 mg 166.7 mL/hr over 90 Minutes Intravenous  Once 09/06/20 0541 09/06/20 0739   09/05/20 1400  ceFAZolin (ANCEF) IVPB 1 g/50 mL premix        1 g 100 mL/hr over 30 Minutes Intravenous Every 8 hours 09/05/20 1120 09/15/20 1359   08/30/20 0600  vancomycin (VANCOREADY) IVPB 500 mg/100 mL  Status:  Discontinued        500 mg 100 mL/hr over 60 Minutes Intravenous Every 12 hours 08/29/20 1554 09/01/20 1115   08/29/20 1700  ceFEPIme (MAXIPIME) 2 g in sodium chloride 0.9 %  100 mL IVPB  Status:  Discontinued        2 g 200 mL/hr over 30 Minutes Intravenous Every 12 hours 08/29/20 1554 09/01/20 1115   08/29/20 1645  vancomycin (VANCOREADY) IVPB 1250 mg/250 mL        1,250 mg 166.7 mL/hr over 90 Minutes Intravenous  Once 08/29/20 1554 08/29/20 1820       Assessment/Plan 55 yo female with hemorrhagic stroke, dysphagia. POD2 s/p PEG placement 2/24 by Dr. Sheliah Hatch. - Bleeding resolved, PEG site looks good.  - Continue tube feeds at goal - Keep abdominal binder in place at all times to prevent tube dislodgement   Will sign off, please let us know if additional problems arise.      LOS: 16 days   Maudry Diego, MD FACS Surgical Oncology, General Surgery, Trauma and  Critical [facility] Surgery, [place] [phone] for weekday/non holidays Check [email or web address] for coverage night/weekend/holidays  Do not use SecureChat as it is not reliable for timely patient care.     09/12/20 9:24 AM

## 2020-09-13 DIAGNOSIS — Z430 Encounter for attention to tracheostomy: Secondary | ICD-10-CM

## 2020-09-13 DIAGNOSIS — I169 Hypertensive crisis, unspecified: Secondary | ICD-10-CM

## 2020-09-13 LAB — TYPE AND SCREEN
ABO/RH(D): O POS
Antibody Screen: NEGATIVE
Unit division: 0

## 2020-09-13 LAB — GLUCOSE, CAPILLARY
Glucose-Capillary: 120 mg/dL — ABNORMAL HIGH (ref 70–99)
Glucose-Capillary: 120 mg/dL — ABNORMAL HIGH (ref 70–99)
Glucose-Capillary: 135 mg/dL — ABNORMAL HIGH (ref 70–99)
Glucose-Capillary: 136 mg/dL — ABNORMAL HIGH (ref 70–99)
Glucose-Capillary: 138 mg/dL — ABNORMAL HIGH (ref 70–99)
Glucose-Capillary: 94 mg/dL (ref 70–99)

## 2020-09-13 LAB — BPAM RBC
Blood Product Expiration Date: 202203232359
ISSUE DATE / TIME: 202202260143
Unit Type and Rh: 5100

## 2020-09-13 LAB — CULTURE, RESPIRATORY W GRAM STAIN: Special Requests: NORMAL

## 2020-09-13 MED ORDER — SCOPOLAMINE 1 MG/3DAYS TD PT72
1.0000 | MEDICATED_PATCH | TRANSDERMAL | Status: DC
  Administered 2020-09-13 – 2020-11-09 (×20): 1.5 mg via TRANSDERMAL
  Filled 2020-09-13 (×20): qty 1

## 2020-09-13 MED ORDER — METOPROLOL TARTRATE 25 MG/10 ML ORAL SUSPENSION
25.0000 mg | Freq: Two times a day (BID) | ORAL | Status: DC
  Administered 2020-09-13 – 2020-09-23 (×21): 25 mg
  Filled 2020-09-13 (×22): qty 10

## 2020-09-13 MED ORDER — SODIUM CHLORIDE 0.9 % IV SOLN
2.0000 g | Freq: Three times a day (TID) | INTRAVENOUS | Status: AC
  Administered 2020-09-13 – 2020-09-19 (×20): 2 g via INTRAVENOUS
  Filled 2020-09-13 (×20): qty 2

## 2020-09-13 NOTE — Progress Notes (Addendum)
NAME:  Charlene [name], MRN:  [id number], DOB:  Sep 09, 1965, LOS: 52 ADMISSION DATE:  08/26/2020, CONSULTATION DATE:  2/10 REFERRING MD:  Dr. Erlinda Hong, CHIEF COMPLAINT:  ICH   Brief History:  55 year old female admitted with hypertensive R thalamic/basal ganglia ICH. Resultant obstructive hydrocephalus prompting EVD placement 2/10. Subsequently, 2/10 she had acute mental status change to obtundation and respiratory arrest resulting in intubation.  Past Medical History:  HTN, medication noncompliance  Significant Hospital Events:  2/09 Admit for ICH 2/10 EVD placed, later intubated due to obtundation and respiratory arrest 2/11 MRI Brain, hypertonic saline d/c'd, EVD not working.  Husband did not consent to PICC  2/13 EVD removed 2/17 GOC discussion with family, PMT  Consults:  Stroke PCCM Neurosurgery  Procedures:  ETT 2/10 >> EVD 2/10 > non functioning 2/11 at 1500; clamped 2/12 am > 2/13 Trach and diagnostic Bronchoscopy 2/24>> Dr. Lynetta Mare PEG placement 2/24>> Dr. Kieth Brightly  Significant Diagnostic Tests:  CT Head 2/9 >> Acute hemorrhage with the epicenter in the right thalamus. Measurement is approximately 3.2 x 2.1 x 2.5 cm (volume 8.8 cm^3). Intraventricular penetration with blood filling the third and fourth ventricles and nearly filling the right lateral ventricle. No hydrocephalus. CTA Head/Neck 2/10 >> Unchanged size of intraparenchymal hemorrhage centered in the right thalamus and basal ganglia with extension into the right lateral ventricle. 2. No intracranial arterial occlusion or high-grade stenosis. No dissection, aneurysm or hemodynamically significant stenosis of the carotid or vertebral arteries. CT Head 2/10 >> Stable parenchymal hemorrhage in the right thalamus and basal ganglia with intraventricular extension as described. No new focal abnormality is noted. CT Head 2/10 (post intubation) >> redemonstrated intraparenchymal hemorrhage centered within the R thalamus/basal  ganglia extending into the cerebral peduncle/midbrain with associated intraventricular extension and hemorrhage in the R lateral ventricle and fourth ventricle, overall not significantly changed from prior; stable mass effect, 66m of R to L midline shift, postsurgical changes of ventriculostomy TTE 2/10 >> LVEF 60 to 65%, LV has normal function, no regional wall motion abnormalities, mild concentric LVH.  RV systolic function is normal, normal pulmonary artery systolic pressure.  MRA head 2/11 >>Negative intracranial MR angiography. No evidence of aneurysm or high flow vascular malformation to explain the intraparenchymal hemorrhage  MRI brain 2/11 >> Similar intraparenchymal hemorrhage centered within the right thalamus and basal ganglia extending into the right midbrain with intraventricular extension, effacement of the prepontine cistern and similar 4 mm of leftward midline shift; separate areas of restricted diffusion and edema involving the R > L mesial temporal lobes/parahippocampal cortex, bilateral basal ganglia, and possibly the upper pons that are c/f acute infarcts, most likely secondary to mass effect from hemorrhage. MRI brain 2/19 >> intraventricular clot has decreased in size but mid-brain parenchymal clot has not and there are more ischemic infarcts in the surrounding brain.  CT Head WO Contrast 09/07/20>> Expected evolution of intracranial hemorrhage since 08/30/2020 head CT. Multiple areas of low attenuation corresponding to some of the infarcts seen on prior MRI. Ventricle caliber similar to prior MRI   Micro Data:  2/9 COVID + Influenza negative 2/10 MRSA PCR negative 2/12 BCx 2 > no growth  2/19 BC 1/2 +Stap EPI, 1/2 no growth   Antimicrobials:  Cefepime 2/12 > 2/15 Vanc 2/12 > 2/15  Interim History / Subjective:  No overnight issues  Objective   Blood pressure 110/68, pulse 91, temperature 98 F (36.7 C), temperature source Axillary, resp. rate (!) 29, height _0  (1.651  m), weight 65.9  kg, last menstrual period 03/03/2014, SpO2 97 %, unknown if currently breastfeeding.    FiO2 (%):  [28 %] 28 %   Intake/Output Summary (Last 24 hours) at 09/13/2020 1015 Last data filed at 09/13/2020 0800 Gross per 24 hour  Intake 2880.49 ml  Output 900 ml  Net 1980.49 ml   Filed Weights   09/11/20 0500 09/12/20 0305 09/13/20 0438  Weight: 64.7 kg 68.3 kg 65.9 kg   Examination: General: adult female, trached on trach collar HEENT: jaw clenched, bite block out Neuro: does not follow commands, extensor posturing with stimulation CV: RRR, no MRG PULM: non labored respirations, no wheezes or crackles GI: soft, binder in place, PEG site CDI. Extremities: -edema , no obvious deformities noted Skin: dry/intact, warm   Resolved Hospital Problem list   Hypernatremia   Assessment & Plan:  Multiple acute strokes  - Per neuro "scattered b/l hippocampus, posterior CC, bilateral thalamus, and upper pons, likely due to deep penetrating vessel compression by focal mass-effect" Intraparenchymal hemorrhage with right thalamic hemorrhage with extension into the third fourth and right lateral ventricle -Secondary to HTN and medication noncompliance  Obstructive hydrocephalus -S/P EVD placement 2/10, subsequently clamped and removed 2/13 -S/P 3% P: Management per neurology  Maintain neuro protective measures; goal for eurothermia, euglycemia, eunatermia, normoxia, and PCO2 goal of 35-40 Nutrition and bowel regiment  Seizure precautions  Aspirations precautions   Hypertensive emergency -Home medications include Coreg and HCTZ P:  SBP goal <160 Continue prn labetalol, will switch from coreg to metoprolol given tachycardia. If she needs additional BP management will add amlodipine PRN IV antihypertensives  Closely monitor hemodynamics in the ICU setting   Acute hypoxemic respiratory failure secondary to CVA Serratia Pneumonia P: Full Vent support with LTVV Trached  2/24 Has been doing TC since 2/26 morning, now 24 hours. Continue as tolerated.  VAP bundle in place  Adding scopolamine patch for oral secretions Will start cefepime for serratia tracheitis  Central fever  P: Buspirone started 2/20 Trend CBC and fever curve   Possible bacterial sinusitis -Extensive diffuse paranasal sinus opacification and moderate mastoid effusions in the setting of intubation. Tan secretions per ETT P: Currently on Ancef through 3/1 - changing to cefepime for this and serratia  Anemia P: Trend CBC  Monitor for bleeding Transfuse per protocol       Daily Goals Checklist  Pain/Anxiety/Delirium protocol (if indicated): None Neuro vitals: every 4 hours VAP protocol (if indicated): Bundle in place Respiratory support goals: pressure support as tolerated. DVT prophylaxis: Enoxaparin Nutrition Status: Tolerating tube feeds GI prophylaxis: Protonix Urinary catheter: External catheter Mobility/therapy needs: Bedrest Daily labs: CBC, BMP twice weekly Code Status: Full Disposition: ICU. Based on decision for trach and peg, will need to discuss disposition for placement in SNF with social work. Apparently might need to go to SNF out of state.  Goals of Care:  Last date of multidisciplinary goals of care discussion: 2/23 Family and staff present: Multiple family members, Wadie Lessen with PMT, Dr. Erlinda Hong, and Dr. Lynetta Mare Summary of discussion: Per chart review PMT met with family 2/13 with decision for trach and PEG  Follow up goals of care discussion due: 3/2 Code Status: Full  The patient is critically ill due to respiratory failure.  Critical care was necessary to treat or prevent imminent or life-threatening deterioration.  Critical care was time spent personally by me on the following activities: development of treatment plan with patient and/or surrogate as well as nursing, discussions with consultants, evaluation of  patient's response to treatment, examination  of patient, obtaining history from patient or surrogate, ordering and performing treatments and interventions, ordering and review of laboratory studies, ordering and review of radiographic studies, pulse oximetry, re-evaluation of patient's condition and participation in multidisciplinary rounds.   Critical Care Time devoted to patient care services described in this note is 31 minutes. This time reflects time of care of this [place] . This critical care time does not reflect separately billable procedures or procedure time, teaching time or supervisory time of PA/NP/Med student/Med Resident etc but could involve care discussion time.       [facility] 09/13/2020 10:15 AM  Pager: see AMION After hours pager: [phone]  If no response to pager , please call [phone] until 7pm After 7:00 pm call Elink  [phone]

## 2020-09-13 NOTE — Plan of Care (Signed)
  Problem: Nutrition: Goal: Dietary intake will improve Outcome: Progressing   Problem: Education: Goal: Knowledge of disease or condition will improve Outcome: Not Progressing Goal: Knowledge of secondary prevention will improve Outcome: Not Progressing Goal: Knowledge of patient specific risk factors addressed and post discharge goals established will improve Outcome: Not Progressing Goal: Individualized Educational Video(s) Outcome: Not Progressing   Problem: Coping: Goal: Will verbalize positive feelings about self Outcome: Not Progressing Goal: Will identify appropriate support needs Outcome: Not Progressing   Problem: Health Behavior/Discharge Planning: Goal: Ability to manage health-related needs will improve Outcome: Not Progressing   Problem: Self-Care: Goal: Ability to participate in self-care as condition permits will improve Outcome: Not Progressing Goal: Verbalization of feelings and concerns over difficulty with self-care will improve Outcome: Not Progressing Goal: Ability to communicate needs accurately will improve Outcome: Not Progressing   Problem: Nutrition: Goal: Risk of aspiration will decrease Outcome: Not Progressing   Problem: Intracerebral Hemorrhage Tissue Perfusion: Goal: Complications of Intracerebral Hemorrhage will be minimized Outcome: Not Progressing

## 2020-09-14 LAB — BASIC METABOLIC PANEL
Anion gap: 12 (ref 5–15)
BUN: 15 mg/dL (ref 6–20)
CO2: 24 mmol/L (ref 22–32)
Calcium: 8.7 mg/dL — ABNORMAL LOW (ref 8.9–10.3)
Chloride: 104 mmol/L (ref 98–111)
Creatinine, Ser: 0.68 mg/dL (ref 0.44–1.00)
GFR, Estimated: >60 mL/min (ref >60)
Glucose, Bld: 135 mg/dL — ABNORMAL HIGH (ref 70–99)
Potassium: 4.1 mmol/L (ref 3.5–5.1)
Sodium: 140 mmol/L (ref 135–145)

## 2020-09-14 LAB — CBC
HCT: 29.3 % — ABNORMAL LOW (ref 36.0–46.0)
Hemoglobin: 9.7 g/dL — ABNORMAL LOW (ref 12.0–15.0)
MCH: 26.1 pg (ref 26.0–34.0)
MCHC: 33.1 g/dL (ref 30.0–36.0)
MCV: 79 fL — ABNORMAL LOW (ref 80.0–100.0)
Platelets: 479 10*3/uL — ABNORMAL HIGH (ref 150–400)
RBC: 3.71 MIL/uL — ABNORMAL LOW (ref 3.87–5.11)
RDW: 16.4 % — ABNORMAL HIGH (ref 11.5–15.5)
WBC: 9.4 10*3/uL (ref 4.0–10.5)
nRBC: 0 % (ref 0.0–0.2)

## 2020-09-14 LAB — GLUCOSE, CAPILLARY
Glucose-Capillary: 131 mg/dL — ABNORMAL HIGH (ref 70–99)
Glucose-Capillary: 86 mg/dL (ref 70–99)
Glucose-Capillary: 126 mg/dL — ABNORMAL HIGH (ref 70–99)
Glucose-Capillary: 139 mg/dL — ABNORMAL HIGH (ref 70–99)
Glucose-Capillary: 139 mg/dL — ABNORMAL HIGH (ref 70–99)

## 2020-09-14 MED ORDER — LABETALOL HCL 5 MG/ML IV SOLN
10.0000 mg | INTRAVENOUS | Status: DC | PRN
  Administered 2020-09-14 – 2020-10-06 (×11): 10 mg via INTRAVENOUS
  Filled 2020-09-14 (×13): qty 4

## 2020-09-14 MED ORDER — LABETALOL HCL 5 MG/ML IV SOLN
10.0000 mg | INTRAVENOUS | Status: DC | PRN
  Administered 2020-09-14: 10 mg via INTRAVENOUS

## 2020-09-14 MED ORDER — OXYCODONE HCL 5 MG PO TABS
5.0000 mg | ORAL_TABLET | ORAL | Status: DC | PRN
  Administered 2020-09-14 – 2020-10-06 (×14): 5 mg
  Filled 2020-09-14 (×13): qty 1

## 2020-09-14 MED ORDER — OXYCODONE HCL 5 MG PO TABS
5.0000 mg | ORAL_TABLET | ORAL | Status: DC | PRN
  Filled 2020-09-14: qty 1

## 2020-09-14 MED ORDER — HYDRALAZINE HCL 20 MG/ML IJ SOLN
10.0000 mg | INTRAMUSCULAR | Status: DC | PRN
  Administered 2020-09-14 – 2020-09-28 (×17): 10 mg via INTRAVENOUS
  Filled 2020-09-14 (×17): qty 1

## 2020-09-14 MED ORDER — CEFAZOLIN SODIUM-DEXTROSE 1-4 GM/50ML-% IV SOLN: 1.0000 g | Freq: Three times a day (TID) | INTRAVENOUS | Status: DC

## 2020-09-14 MED ORDER — METOPROLOL TARTRATE 5 MG/5ML IV SOLN
5.0000 mg | INTRAVENOUS | Status: AC
  Administered 2020-09-14: 5 mg via INTRAVENOUS
  Filled 2020-09-14: qty 5

## 2020-09-14 MED ORDER — METOPROLOL TARTRATE 5 MG/5ML IV SOLN
INTRAVENOUS | Status: AC
  Filled 2020-09-14: qty 5

## 2020-09-14 NOTE — Progress Notes (Signed)
Pt had a HR sustaining in the 140-150s and elevated blood pressure. 1st line of PRN med was given and 2nd line is being prepared to be given. MD paged and awaiting call and orders.

## 2020-09-14 NOTE — Progress Notes (Signed)
Pt has arrived on the unit via hospital bed. All belongings are with the patient. Family is at bedside. RN notified MD of red MEWS no new orders placed. Per MD order, due to the length of stay NIH is no longer needed. Pt has call light and telephone within reach.  09/14/20 1303  Vitals  Temp 100.2 F (37.9 C)  Temp Source Axillary  BP (!) 155/97  MAP (mmHg) 112  BP Location Left Arm  BP Method Automatic  Patient Position (if appropriate) Lying  Pulse Rate (!) 120  Pulse Rate Source Monitor  ECG Heart Rate (!) 122  Resp (!) 32  Level of Consciousness  Level of Consciousness Responds to Pain  MEWS COLOR  MEWS Score Color Red  Oxygen Therapy  SpO2 100 %  O2 Device Tracheostomy Collar  Patient Activity (if Appropriate) In bed  Pain Assessment  Pain Scale CPOT  Critical Care Pain Observation Tool (CPOT)  Facial Expression 0  Body Movements 0  Muscle Tension 2  Compliance with ventilator (intubated pts.) N/A  Vocalization (extubated pts.) 0  CPOT Total 2  MEWS Score  MEWS Temp 0  MEWS Systolic 0  MEWS Pulse 2  MEWS RR 2  MEWS LOC 2  MEWS Score 6  Provider Notification  Provider Name/Title Hortencia Pilar MD  Date Provider Notified 09/14/20  Time Provider Notified 1327  Notification Type Call  Notification Reason Other (Comment) (New admission)  Provider response No new orders  Date of Provider Response 09/14/20  Time of Provider Response 1328

## 2020-09-14 NOTE — Progress Notes (Signed)
Orthopedic Tech Progress Note Patient Details:  NOTNAMED CROUCHER [birth year] [id number]  Ortho Devices Type of Ortho Device: Prafo boot/shoe Ortho Device/Splint Location: BLE Ortho Device/Splint Interventions: Ordered,Application,Adjustment   Post Interventions Patient Tolerated: Well Instructions Provided: Care of device   Donald Pore 09/14/2020, 8:31 AM

## 2020-09-14 NOTE — Progress Notes (Signed)
NAME:  Charlene [name], MRN:  [id number], DOB:  08/01/1965, LOS: 52 ADMISSION DATE:  08/26/2020, CONSULTATION DATE:  2/10 REFERRING MD:  Dr. Erlinda Hong, CHIEF COMPLAINT:  ICH   Brief History:  56 year old female admitted with hypertensive R thalamic/basal ganglia ICH. Resultant obstructive hydrocephalus prompting EVD placement 2/10. Subsequently, 2/10 she had acute mental status change to obtundation and respiratory arrest resulting in intubation.  Past Medical History:  HTN, medication noncompliance  Significant Hospital Events:  2/09 Admit for ICH 2/10 EVD placed, later intubated due to obtundation and respiratory arrest 2/11 MRI Brain, hypertonic saline d/c'd, EVD not working.  Husband did not consent to PICC  2/13 EVD removed 2/17 [place] discussion with family, PMT 2/24 Bedside trach and PEG  Consults:  Stroke PCCM Neurosurgery  Procedures:  ETT 2/10 >> EVD 2/10 > non functioning 2/11 at 1500; clamped 2/12 am > 2/13 Trach and diagnostic Bronchoscopy 2/24>> Dr. Lynetta Mare PEG placement 2/24>> Dr. Kieth Brightly  Significant Diagnostic Tests:  CT Head 2/9 >> Acute hemorrhage with the epicenter in the right thalamus. Measurement is approximately 3.2 x 2.1 x 2.5 cm (volume 8.8 cm^3). Intraventricular penetration with blood filling the third and fourth ventricles and nearly filling the right lateral ventricle. No hydrocephalus. CTA Head/Neck 2/10 >> Unchanged size of intraparenchymal hemorrhage centered in the right thalamus and basal ganglia with extension into the right lateral ventricle. 2. No intracranial arterial occlusion or high-grade stenosis. No dissection, aneurysm or hemodynamically significant stenosis of the carotid or vertebral arteries. CT Head 2/10 >> Stable parenchymal hemorrhage in the right thalamus and basal ganglia with intraventricular extension as described. No new focal abnormality is noted. CT Head 2/10 (post intubation) >> redemonstrated intraparenchymal hemorrhage centered  within the R thalamus/basal ganglia extending into the cerebral peduncle/midbrain with associated intraventricular extension and hemorrhage in the R lateral ventricle and fourth ventricle, overall not significantly changed from prior; stable mass effect, 12m of R to L midline shift, postsurgical changes of ventriculostomy TTE 2/10 >> LVEF 60 to 65%, LV has normal function, no regional wall motion abnormalities, mild concentric LVH.  RV systolic function is normal, normal pulmonary artery systolic pressure.  MRA head 2/11 >>Negative intracranial MR angiography. No evidence of aneurysm or high flow vascular malformation to explain the intraparenchymal hemorrhage  MRI brain 2/11 >> Similar intraparenchymal hemorrhage centered within the right thalamus and basal ganglia extending into the right midbrain with intraventricular extension, effacement of the prepontine cistern and similar 4 mm of leftward midline shift; separate areas of restricted diffusion and edema involving the R > L mesial temporal lobes/parahippocampal cortex, bilateral basal ganglia, and possibly the upper pons that are c/f acute infarcts, most likely secondary to mass effect from hemorrhage. MRI brain 2/19 >> intraventricular clot has decreased in size but mid-brain parenchymal clot has not and there are more ischemic infarcts in the surrounding brain.  CT Head WO Contrast 09/07/20>> Expected evolution of intracranial hemorrhage since 08/30/2020 head CT. Multiple areas of low attenuation corresponding to some of the infarcts seen on prior MRI. Ventricle caliber similar to prior MRI  Micro Data:  2/9 COVID + Influenza negative 2/10 MRSA PCR negative 2/12 BCx 2 > no growth  2/19 BC 1/2 +Stap EPI, 1/2 no growth  2/27 Sputum culture > Serratia > Resistant to Cefazolin only    Antimicrobials:  Cefepime 2/12 > 2/15 Vanc 2/12 > 2/15  Interim History / Subjective:  Lying in bed in no acute distress  Remains on ATC Continues to become  hypertensive  on stimulation   Objective   Blood pressure (!) 154/90, pulse (!) 107, temperature (!) 100.9 F (38.3 C), temperature source Axillary, resp. rate (!) 26, height '5\' 5"'  (1.651 m), weight 67.4 kg, last menstrual period 03/03/2014, SpO2 100 %, unknown if currently breastfeeding.    FiO2 (%):  [28 %] 28 %   Intake/Output Summary (Last 24 hours) at 09/14/2020 0718 Last data filed at 09/14/2020 0700 Gross per 24 hour  Intake 3370.43 ml  Output 2060 ml  Net 1310.43 ml   Filed Weights   09/12/20 0305 09/13/20 0438 09/14/20 0500  Weight: 68.3 kg 65.9 kg 67.4 kg   Examination: General: Acute ill appearing middle aged female on ATC, in NAD HEENT: 6 cuffed shiley, MM pink/moist  Neuro: Extensor posturing on stimulation CV: s1s2 regular rate and rhythm, no murmur, rubs, or gallops,  PULM:  On ATC, mild rhonchi, continues with tan thick secretions  GI: soft, bowel sounds active in all 4 quadrants, non-tender, non-distended, tolerating TF Extremities: warm/dry, no edema  Skin: no rashes or lesions  Resolved Hospital Problem list   Hypernatremia   Assessment & Plan:  Multiple acute strokes  - Per neuro "scattered b/l hippocampus, posterior CC, bilateral thalamus, and upper pons, likely due to deep penetrating vessel compression by focal mass-effect" Intraparenchymal hemorrhage with right thalamic hemorrhage with extension into the third fourth and right lateral ventricle -Secondary to HTN and medication noncompliance  Obstructive hydrocephalus -S/P EVD placement 2/10, subsequently clamped and removed 2/13 -S/P 3% P: Management per neurology  Maintain neuro protective measures; goal for eurothermia, euglycemia, eunatermia, normoxia, and PCO2 goal of 35-40 Nutrition and bowel regiment  Seizure precautions  Aspirations precautions   Hypertensive emergency -Home medications include Coreg and HCTZ P:  SBP goal remains < 160 Continue PO Lopressor As needed IV antihypertensives   Closely monitor hemodynamics in the ICU setting   Acute hypoxemic respiratory failure secondary to CVA -S/P Trach  Serratia Pneumonia P: Routine trach care  Remains on ATC since AM of 2/26 Scopolamine patch for secretions Continue Cefepime for Serratia tracheitis   Elevated HOB Suction as needed   Central fever  P: T-max of 100.9 Buspirone started 2/20 Continue to trend CBC and fever curve   Possible bacterial sinusitis -Extensive diffuse paranasal sinus opacification and moderate mastoid effusions in the setting of intubation. Tan secretions per ETT P: Supportive care  Serratia as above   Anemia P: Trend CBC  Monitor for any signs of bleeding  Transfuse per protoclo   Daily Goals Checklist  Pain/Anxiety/Delirium protocol (if indicated): None Neuro vitals: every 4 hours VAP protocol (if indicated): Bundle in place Respiratory support goals: pressure support as tolerated. DVT prophylaxis: Enoxaparin Nutrition Status: Tolerating tube feeds GI prophylaxis: Protonix Urinary catheter: External catheter Mobility/therapy needs: Bedrest Daily labs: CBC, BMP twice weekly Code Status: Full Disposition: Stable for transfer to progressive floor   Goals of Care:  Last date of multidisciplinary goals of care discussion: 2/23 Family and staff present: Multiple family members, Wadie Lessen with PMT, Dr. Erlinda Hong, and Dr. Lynetta Mare Summary of discussion: Per chart review PMT met with family 2/13 with decision for trach and PEG  Follow up goals of care discussion due: 3/2 Code Status: Full   Critical care    Performed by: Johnsie Cancel  Total critical care time: 37 minutes  Critical care time was exclusive of separately billable procedures and treating other patients.  Critical care was necessary to treat or prevent imminent or life-threatening deterioration.  Critical care was time spent personally by me on the following activities: development of treatment plan with  patient and/or surrogate as well as nursing, discussions with consultants, evaluation of patient's response to treatment, examination of patient, obtaining history from patient or surrogate, ordering and performing treatments and interventions, ordering and review of laboratory studies, ordering and review of radiographic studies, pulse oximetry and re-evaluation of patient's condition.  Johnsie Cancel, NP-C [facility] Personal contact information can be found on Amion  If no response please page: Adult pulmonary and critical care medicine pager on Amion unitl 7pm After 7pm please call [phone] 09/14/2020, 8:12 AM

## 2020-09-15 ENCOUNTER — Inpatient Hospital Stay (HOSPITAL_COMMUNITY): Payer: Medicaid Other

## 2020-09-15 DIAGNOSIS — R627 Adult failure to thrive: Secondary | ICD-10-CM

## 2020-09-15 DIAGNOSIS — R609 Edema, unspecified: Secondary | ICD-10-CM

## 2020-09-15 LAB — COMPREHENSIVE METABOLIC PANEL
ALT: 57 U/L — ABNORMAL HIGH (ref 0–44)
AST: 58 U/L — ABNORMAL HIGH (ref 15–41)
Albumin: 1.8 g/dL — ABNORMAL LOW (ref 3.5–5.0)
Alkaline Phosphatase: 113 U/L (ref 38–126)
Anion gap: 11 (ref 5–15)
BUN: 19 mg/dL (ref 6–20)
CO2: 25 mmol/L (ref 22–32)
Calcium: 8.7 mg/dL — ABNORMAL LOW (ref 8.9–10.3)
Chloride: 104 mmol/L (ref 98–111)
Creatinine, Ser: 0.9 mg/dL (ref 0.44–1.00)
GFR, Estimated: >60 mL/min (ref >60)
Glucose, Bld: 137 mg/dL — ABNORMAL HIGH (ref 70–99)
Potassium: 4.3 mmol/L (ref 3.5–5.1)
Sodium: 140 mmol/L (ref 135–145)
Total Bilirubin: 0.3 mg/dL (ref 0.3–1.2)
Total Protein: 6.2 g/dL — ABNORMAL LOW (ref 6.5–8.1)

## 2020-09-15 LAB — CBC WITH DIFFERENTIAL/PLATELET
Abs Immature Granulocytes: 0.05 10*3/uL (ref 0.00–0.07)
Basophils Absolute: 0 10*3/uL (ref 0.0–0.1)
Basophils Relative: 0 %
Eosinophils Absolute: 0.1 10*3/uL (ref 0.0–0.5)
Eosinophils Relative: 1 %
HCT: 28.3 % — ABNORMAL LOW (ref 36.0–46.0)
Hemoglobin: 9 g/dL — ABNORMAL LOW (ref 12.0–15.0)
Immature Granulocytes: 1 %
Lymphocytes Relative: 16 %
Lymphs Abs: 1.7 10*3/uL (ref 0.7–4.0)
MCH: 25.6 pg — ABNORMAL LOW (ref 26.0–34.0)
MCHC: 31.8 g/dL (ref 30.0–36.0)
MCV: 80.6 fL (ref 80.0–100.0)
Monocytes Absolute: 0.8 10*3/uL (ref 0.1–1.0)
Monocytes Relative: 7 %
Neutro Abs: 8 10*3/uL — ABNORMAL HIGH (ref 1.7–7.7)
Neutrophils Relative %: 75 %
Platelets: 481 10*3/uL — ABNORMAL HIGH (ref 150–400)
RBC: 3.51 MIL/uL — ABNORMAL LOW (ref 3.87–5.11)
RDW: 17.2 % — ABNORMAL HIGH (ref 11.5–15.5)
WBC: 10.7 10*3/uL — ABNORMAL HIGH (ref 4.0–10.5)
nRBC: 0 % (ref 0.0–0.2)

## 2020-09-15 LAB — GLUCOSE, CAPILLARY
Glucose-Capillary: 109 mg/dL — ABNORMAL HIGH (ref 70–99)
Glucose-Capillary: 111 mg/dL — ABNORMAL HIGH (ref 70–99)
Glucose-Capillary: 120 mg/dL — ABNORMAL HIGH (ref 70–99)
Glucose-Capillary: 125 mg/dL — ABNORMAL HIGH (ref 70–99)
Glucose-Capillary: 126 mg/dL — ABNORMAL HIGH (ref 70–99)
Glucose-Capillary: 127 mg/dL — ABNORMAL HIGH (ref 70–99)
Glucose-Capillary: 127 mg/dL — ABNORMAL HIGH (ref 70–99)

## 2020-09-15 LAB — PHOSPHORUS: Phosphorus: 4.4 mg/dL (ref 2.5–4.6)

## 2020-09-15 LAB — MAGNESIUM: Magnesium: 2 mg/dL (ref 1.7–2.4)

## 2020-09-15 IMAGING — DX DG CHEST 1V PORT
1 series · 1 of 1 positions shown · non-contrast
Comparison: [DATE]

CLINICAL DATA: Fever

EXAM:
PORTABLE CHEST 1 VIEW

[chest]
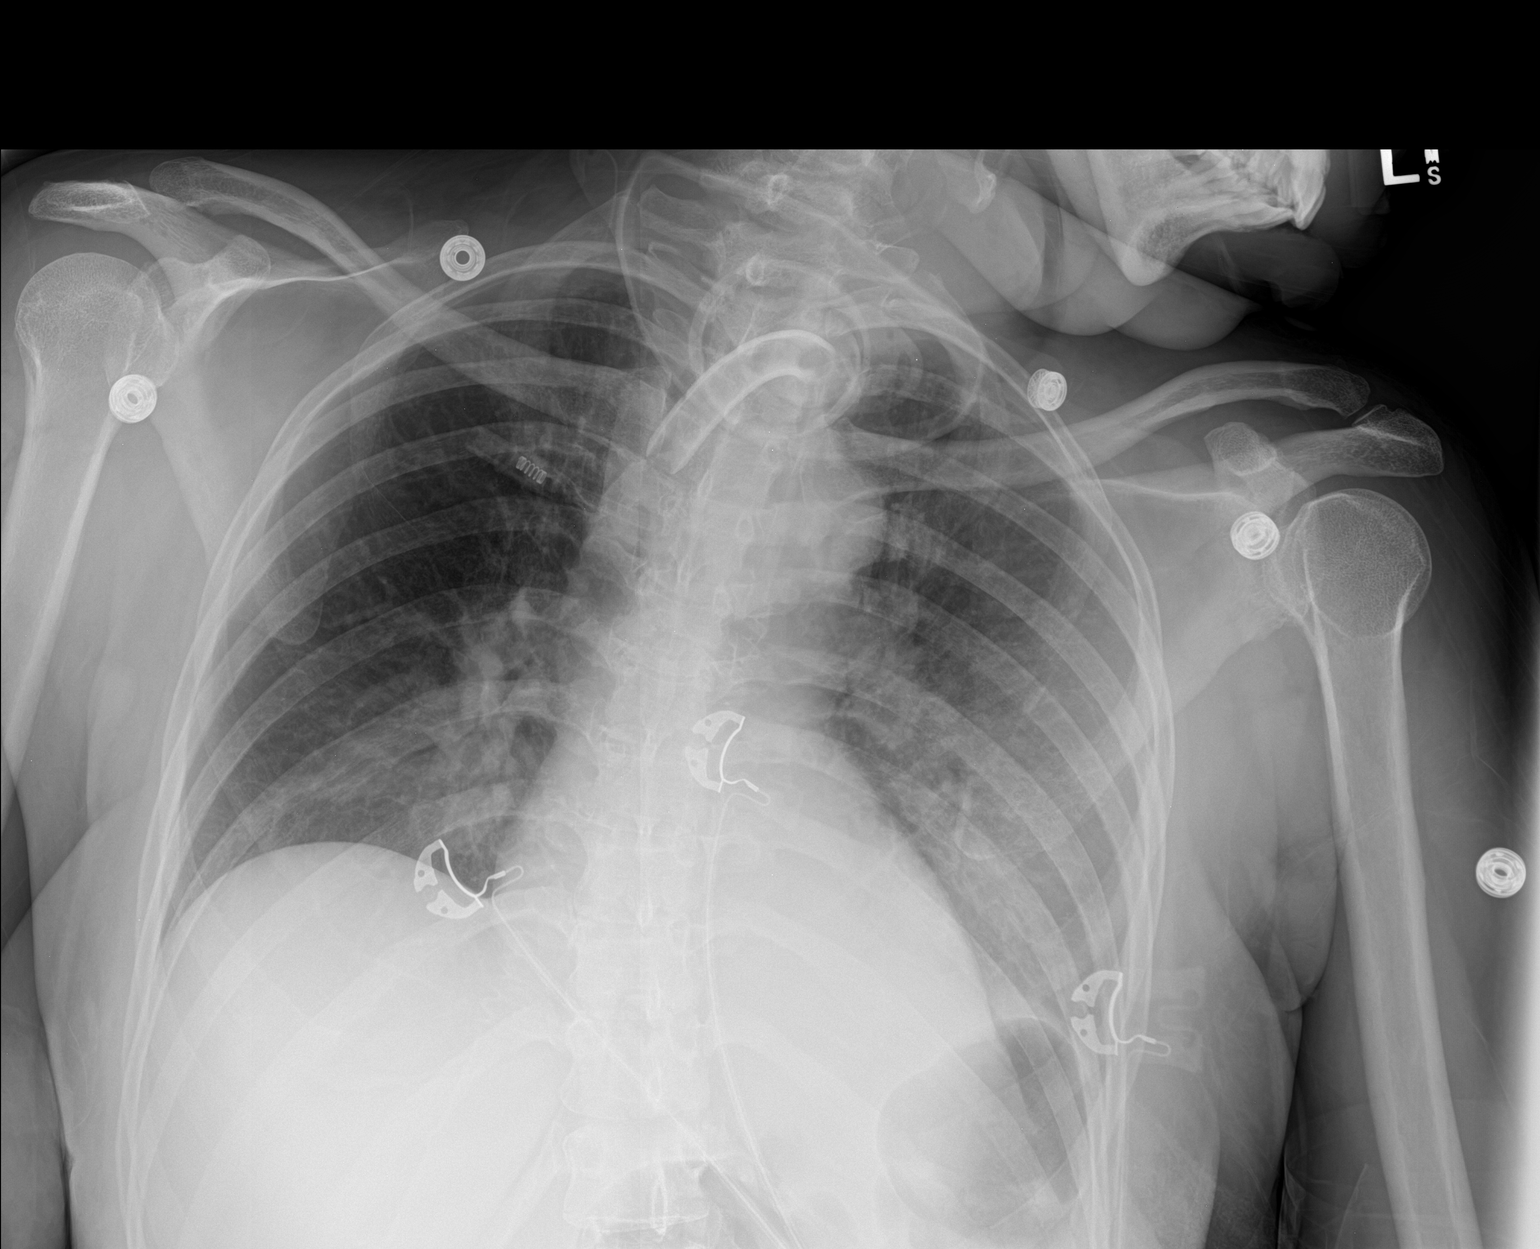

[1 of 1 positions shown; findings below may reference images not displayed]

FINDINGS: Tracheostomy is unchanged. Heart is normal size. Bilateral lower
lobe airspace opacities, left greater than right concerning for
pneumonia. Possible small layering left effusion. No pneumothorax.
No acute bony abnormality.
IMPRESSION: Bilateral lower lobe opacities, left greater than right concerning
for pneumonia.

Suspect small layering left effusion.

## 2020-09-15 MED ORDER — VANCOMYCIN HCL 1250 MG/250ML IV SOLN
1250.0000 mg | Freq: Once | INTRAVENOUS | Status: AC
  Administered 2020-09-15: 1250 mg via INTRAVENOUS
  Filled 2020-09-15: qty 250

## 2020-09-15 MED ORDER — VANCOMYCIN HCL 1250 MG/250ML IV SOLN
1250.0000 mg | INTRAVENOUS | Status: DC
  Administered 2020-09-16: 1250 mg via INTRAVENOUS
  Filled 2020-09-15 (×2): qty 250

## 2020-09-15 MED ORDER — ERYTHROMYCIN 5 MG/GM OP OINT
1.0000 "application " | TOPICAL_OINTMENT | Freq: Four times a day (QID) | OPHTHALMIC | Status: AC
  Administered 2020-09-15 – 2020-09-22 (×27): 1 via OPHTHALMIC
  Filled 2020-09-15 (×2): qty 3.5

## 2020-09-15 NOTE — Plan of Care (Signed)
  Problem: Education: Goal: Knowledge of disease or condition will improve Outcome: Progressing Goal: Knowledge of secondary prevention will improve Outcome: Progressing Goal: Knowledge of patient specific risk factors addressed and post discharge goals established will improve Outcome: Progressing Goal: Individualized Educational Video(s) Outcome: Progressing   Problem: Coping: Goal: Will verbalize positive feelings about self Outcome: Progressing Goal: Will identify appropriate support needs Outcome: Progressing   Problem: Health Behavior/Discharge Planning: Goal: Ability to manage health-related needs will improve Outcome: Progressing   Problem: Self-Care: Goal: Ability to participate in self-care as condition permits will improve Outcome: Progressing Goal: Verbalization of feelings and concerns over difficulty with self-care will improve Outcome: Progressing Goal: Ability to communicate needs accurately will improve Outcome: Progressing   Problem: Nutrition: Goal: Risk of aspiration will decrease Outcome: Progressing Goal: Dietary intake will improve Outcome: Progressing   Problem: Intracerebral Hemorrhage Tissue Perfusion: Goal: Complications of Intracerebral Hemorrhage will be minimized Outcome: Progressing   

## 2020-09-15 NOTE — CV Procedure (Signed)
BUE venous duplex completed. Preliminary findings given to Marlin, RN at [phone].  Results can be found under chart review under CV PROC. 09/15/2020 5:35 PM Brayley Mackowiak RVT, RDMS

## 2020-09-15 NOTE — Progress Notes (Addendum)
PROGRESS NOTE    Charlene [name]  IDP:[id number] DOB: 31-Oct-1965 DOA: 08/26/2020 PCP: Patient, [name]   Chief Complaint  Patient presents with  . Code Stroke   Brief Narrative:  55 yo F with 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  Subsequently on 2/10, she developed obtundation and respiratory arrest leading to intubation.    2/09 Admit for [place] 2/10 EVD placed, later intubated due to obtundation and respiratory arrest 2/11 MRI Brain, hypertonic saline d/c'd, EVD not working.  Husband did not consent to PICC  2/13 EVD removed 2/17 [place] discussion with family, PMT 2/24 Bedside trach and PEG 3/1 Transferred to [place]:   Principal Problem:   ICH (intracerebral hemorrhage) ([place]) Active Problems:   Intraparenchymal hemorrhage of brain (HCC)   Acute hypoxemic respiratory failure (HCC)   Fever   Endotracheal tube present  Multiple acute strokes  Right Thalamus ICH with IVH  Obstructive Hydrocephalus Acute Metabolic Encephalopathy  - CT 2/9 with acute hemorrhage with epicenter in the right thalamus.  Intraventricular penetration with blood filling the 3rd and forth ventricles and nearly filling the right lateral ventricle - MRI 2/11 with intraparenchymal hemorrhage centered within the right thalamus and basal ganglia extending into the right midbrain with intraventricular extension.  Effacement of prepontine cistern and similar 4 mm of leftward midline shift.  Separate areas of ristricted diffusion and edema involving the right greater than left mesial temporal lobes/parahippocampal cortex, bilateral basal ganglia, and possibly the upper pons that are concerning for acute infarcts, most likely 2/2 mass effect from the hemorrhage (see report)/ Per neuro "scattered b/l hippocampus, posterior CC, bilateral thalamus, and upper pons, likely due to deep penetrating vessel compression by  focal mass-effect" - 2/19 MRI with acute to early subacute infarcts in the bilateral cerebral white matter and right cerebellum, new from 2/11 - interval evolution of multiple other infarcts present on prior MRI, increased hemorrhage along old right EVD tract (see report) - 2/21 head CT with expected evolution of ICH since 2/13 - echo with normal EF, no intracardiac source of embolism (see report) - CTA head/neck without intracranial arterial occlusion or high grade stenosis, no dissection, aneurysm, or hemodynamically significant stenosis of the carotid or vertebral arteries - per neurology - see last follow up note from 2/25 - ICH likely 2/2 hypertension and nonadherence to meds - s/p trach/peg on 2/24 - no antithrombotic 2/2 ICH - for obstructive hydrocephalus -> s/p external ventricular catheter placement on 08/27/20 by neurosurgery - EVD clamped on 2/12 and d/c'd on 2/13 - EEG suggestive of cortical dysfunction in right hemisphere likely 2/2 underlying bleed as well as severe diffuse encephalopathy, nonspecific etiology  - only withdraws to painful stimuli - will need to continue goals of care/palliative  - seizure precautions, aspiration precautions, nutrition, bowel regimen   Hypertensive emergency -Home medications include Coreg and HCTZ -currently on metoprolol  -hydral and labetalol prn -SBP goal remains < 160  Fever  Serratia Pneumonia  Hospital Acquired Pneumonia:  Tracheal aspirate from 2/25 with serratia Started on cefepime on 2/27 (was on ancef prior) for serratia tracheitis Recurrent high fever on 2/28 -> follow repeat blood cx.  CXR with bilateral lower lobe opacities (L>R concerning for pneumonia).  Follow UA, urine culture.  MRSA PCR.  Broaden abx to include vanc.  Narrow as able.  Scopolamine patch for secretions Some concern for central fever at times as well - started on burspirone.  Acute hypoxemic respiratory failure secondary to CVA -S/P Trach  -continue on  trach collar -pulm continuing to follow for trach  Dysphagia - s/p peg on tube feeds  Possible bacterial sinusitis - on broad spectrum abx  Bacterial Conjunctivitis - left eye, significant upper eyelid swelling as well - start erythromycin ointment - continue to monitor  Anasarca - bilateral upper extremity edema, minimal LE edema - suspect related to prolonged hospitalization, hypoalbuminemia - follow upper extremity US -> age indeterminate SVT of right cephalic vein  Elevated LFT's - mild, continue to monitor  Leukocytosis - mild uptrend - follow on abx  Anemia Relatively stable, follow  DVT prophylaxis: SCD, lovenox Code Status: full  Family Communication: mother at bedside Disposition:   Status is: Inpatient  Remains inpatient appropriate because:Inpatient level of care appropriate due to severity of illness   Dispo: The patient is from: Home              Anticipated d/c is to: pending              Patient currently is not medically stable to d/c.   Difficult to place patient No  Consultants:   PCCM  Neurology  Neurosurgery  Palliative  General surgery  Procedures:  EEG IMPRESSION: This study is suggestive of cortical dysfunction in right hemisphere likely secondaru to underlyig bleed as well as severe diffuse encephalopathy, nonspecific etiology. No seizures or epileptiform discharges were seen throughout the recording.  2/10 EVD placed 2/13 EVD removed 2/10 intubated 2/24 bedside trach and peg  Echo IMPRESSIONS    1. Left ventricular ejection fraction, by estimation, is 60 to 65%. The  left ventricle has normal function. The left ventricle has no regional  wall motion abnormalities. There is mild concentric left ventricular  hypertrophy. Left ventricular diastolic  parameters were normal.  2. Right ventricular systolic function is normal. The right ventricular  size is normal. There is normal pulmonary artery systolic pressure.  3.  The mitral valve is normal in structure. Trivial mitral valve  regurgitation. No evidence of mitral stenosis.  4. The aortic valve is tricuspid. Aortic valve regurgitation is not  visualized. No aortic stenosis is present.  5. The inferior vena cava is normal in size with <50% respiratory  variability, suggesting right atrial pressure of 8 mmHg.   Conclusion(s)/Recommendation(s): Normal biventricular function without  evidence of hemodynamically significant valvular heart disease. No  intracardiac source of embolism detected on this transthoracic study. Ailynn Gow  transesophageal echocardiogram is  recommended to exclude cardiac source of embolism if clinically indicated.    Antimicrobials: Anti-infectives (From admission, onward)   Start     Dose/Rate Route Frequency Ordered Stop   09/20/20 0600  ceFAZolin (ANCEF) IVPB 1 g/50 mL premix  Status:  Discontinued        1 g 100 mL/hr over 30 Minutes Intravenous Every 8 hours 09/14/20 0903 09/14/20 0904   09/16/20 1300  vancomycin (VANCOREADY) IVPB 1250 mg/250 mL        1,250 mg 166.7 mL/hr over 90 Minutes Intravenous Every 24 hours 09/15/20 1232     09/15/20 1230  vancomycin (VANCOREADY) IVPB 1250 mg/250 mL        1,250 mg 166.7 mL/hr over 90 Minutes Intravenous  Once 09/15/20 1131 09/15/20 1402   09/13/20 1400  ceFEPIme (MAXIPIME) 2 g in sodium chloride 0.9 % 100 mL IVPB        2 g 200 mL/hr over 30 Minutes Intravenous Every 8 hours 09/13/20 1341 09/19/20 2359  09/06/20 0600  vancomycin (VANCOREADY) IVPB 1250 mg/250 mL        1,250 mg 166.7 mL/hr over 90 Minutes Intravenous  Once 09/06/20 0541 09/06/20 0739   09/05/20 1400  ceFAZolin (ANCEF) IVPB 1 g/50 mL premix  Status:  Discontinued        1 g 100 mL/hr over 30 Minutes Intravenous Every 8 hours 09/05/20 1120 09/13/20 1341   08/30/20 0600  vancomycin (VANCOREADY) IVPB 500 mg/100 mL  Status:  Discontinued        500 mg 100 mL/hr over 60 Minutes Intravenous Every 12 hours 08/29/20 1554  09/01/20 1115   08/29/20 1700  ceFEPIme (MAXIPIME) 2 g in sodium chloride 0.9 % 100 mL IVPB  Status:  Discontinued        2 g 200 mL/hr over 30 Minutes Intravenous Every 12 hours 08/29/20 1554 09/01/20 1115   08/29/20 1645  vancomycin (VANCOREADY) IVPB 1250 mg/250 mL        1,250 mg 166.7 mL/hr over 90 Minutes Intravenous  Once 08/29/20 1554 08/29/20 1820     Subjective: unresponsive  Objective: Vitals:   09/15/20 0405 09/15/20 0719 09/15/20 1008 09/15/20 1108  BP:  (!) 155/107 133/87 128/82  Pulse:  (!) 125 (!) 116 (!) 106  Resp:  (!) 26  (!) 31  Temp:  99.3 F (37.4 C)  100 F (37.8 C)  TempSrc:  Axillary  Axillary  SpO2:  100%  100%  Weight: 70.8 kg     Height:        Intake/Output Summary (Last 24 hours) at 09/15/2020 1418 Last data filed at 09/15/2020 1118 Gross per 24 hour  Intake --  Output 1000 ml  Net -1000 ml   Filed Weights   09/13/20 0438 09/14/20 0500 09/15/20 0405  Weight: 65.9 kg 67.4 kg 70.8 kg    Examination:  General exam: Appears calm and comfortable  HEENT: trach - L eye conjunctivities, eyelid swelling Respiratory system: Clear to auscultation. Respiratory effort normal. Cardiovascular system: trachycardic, S1 & S2 heard, RRR.  Gastrointestinal system: Abdomen is nondistended, soft and nontender. peg Central nervous system: withdraws from painful stimuli, does not follow commands - symmetric pupils Extremities: no lee - upper extremity edema  Data Reviewed: I have personally reviewed following labs and imaging studies  CBC: Recent Labs  Lab 09/10/20 2328 09/11/20 0549 09/12/20 0037 09/12/20 0638 09/14/20 0357 09/15/20 0856  WBC 7.5 8.7 9.1  --  9.4 10.7*  NEUTROABS  --   --   --   --   --  8.0*  HGB 8.6* 8.0* 6.7* 8.4* 9.7* 9.0*  HCT 26.3* 24.9* 20.3* 25.3* 29.3* 28.3*  MCV 79.0* 80.1 79.6*  --  79.0* 80.6  PLT 549* 570* 467*  --  479* 481*    Basic Metabolic Panel: Recent Labs  Lab 09/09/20 0438 09/11/20 0549 09/12/20 0037  09/14/20 0357 09/15/20 0856  NA 140 143 140 140 140  K 3.8 4.0 3.8 4.1 4.3  CL 95* 101 103 104 104  CO2 33* 33* '28 24 25  ' GLUCOSE 147* 170* 162* 135* 137*  BUN 31* 31* 25* 15 19  CREATININE 0.97 0.98 0.79 0.68 0.90  CALCIUM 9.1 8.5* 8.2* 8.7* 8.7*  MG 2.4  --  2.1  --  2.0  PHOS  --   --   --   --  4.4    GFR: Estimated Creatinine Clearance: 71.3 mL/min (by C-G formula based on SCr of 0.9 mg/dL).  Liver Function Tests: Recent  Labs  Lab 09/15/20 0856  AST 58*  ALT 57*  ALKPHOS 113  BILITOT 0.3  PROT 6.2*  ALBUMIN 1.8*    CBG: Recent Labs  Lab 09/14/20 2043 09/15/20 0007 09/15/20 0353 09/15/20 0826 09/15/20 1224  GLUCAP 139* 127* 125* 109* 126*     Recent Results (from the past 240 hour(s))  Culture, blood (routine x 2)     Status: None (Preliminary result)   Collection Time: 09/11/20  2:15 PM   Specimen: BLOOD LEFT HAND  Result Value Ref Range Status   Specimen Description BLOOD LEFT HAND  Final   Special Requests   Final    BOTTLES DRAWN AEROBIC AND ANAEROBIC Blood Culture adequate volume   Culture   Final    NO GROWTH 4 DAYS Performed at [facility], [place] [address] [place], [name] [address]    Report Status PENDING  Incomplete  Culture, blood (routine x 2)     Status: None (Preliminary result)   Collection Time: 09/11/20  2:25 PM   Specimen: BLOOD LEFT FOREARM  Result Value Ref Range Status   Specimen Description BLOOD LEFT FOREARM  Final   Special Requests   Final    BOTTLES DRAWN AEROBIC AND ANAEROBIC Blood Culture adequate volume   Culture   Final    NO GROWTH 4 DAYS Performed at [facility], [place] [address], [place], [place] [address]    Report Status PENDING  Incomplete  Culture, Respiratory w Gram Stain     Status: None   Collection Time: 09/11/20  2:39 PM   Specimen: Tracheal Aspirate; Respiratory  Result Value Ref Range Status   Specimen Description TRACHEAL ASPIRATE  Final   Special Requests Normal  Final   Gram Stain    Final    ABUNDANT WBC PRESENT,BOTH PMN AND MONONUCLEAR MODERATE GRAM NEGATIVE RODS RARE GRAM VARIABLE ROD Performed at [facility], [place] [address], [place], [place] [address]    Culture ABUNDANT SERRATIA MARCESCENS  Final   Report Status 09/13/2020 FINAL  Final   Organism ID, Bacteria SERRATIA MARCESCENS  Final      Susceptibility   Serratia marcescens - MIC*    CEFAZOLIN >=64 RESISTANT Resistant     CEFEPIME <=0.12 SENSITIVE Sensitive     CEFTAZIDIME <=1 SENSITIVE Sensitive     CEFTRIAXONE <=0.25 SENSITIVE Sensitive     CIPROFLOXACIN <=0.25 SENSITIVE Sensitive     GENTAMICIN <=1 SENSITIVE Sensitive     TRIMETH/SULFA <=20 SENSITIVE Sensitive     * ABUNDANT SERRATIA MARCESCENS         Radiology Studies: DG CHEST PORT 1 VIEW  Result Date: 09/15/2020 CLINICAL DATA:  Fever EXAM: PORTABLE CHEST 1 VIEW COMPARISON:  09/10/2020 FINDINGS: Tracheostomy is unchanged. Heart is normal size. Bilateral lower lobe airspace opacities, left greater than right concerning for pneumonia. Possible small layering left effusion. No pneumothorax. No acute bony abnormality. IMPRESSION: Bilateral lower lobe opacities, left greater than right concerning for pneumonia. Suspect small layering left effusion. Electronically Signed   By: Rolm Baptise M.D.   On: 09/15/2020 08:52        Scheduled Meds: .  stroke: mapping our early stages of recovery book   Does not apply Once  . atorvastatin  40 mg Per Tube Daily  . chlorhexidine gluconate (MEDLINE KIT)  15 mL Mouth Rinse BID  . Chlorhexidine Gluconate Cloth  6 each Topical Daily  . enoxaparin (LOVENOX) injection  40 mg Subcutaneous Q24H  . feeding supplement (  PROSource TF)  45 mL Per Tube Daily  . free water  100 mL Per Tube Q4H  . insulin aspart  0-9 Units Subcutaneous Q4H  . mouth rinse  15 mL Mouth Rinse 10 times per day  . metoprolol tartrate  25 mg Per Tube BID  . pantoprazole sodium  40 mg Per Tube BID  . scopolamine  1 patch Transdermal  Q72H   Continuous Infusions: . sodium chloride 100 mL/hr at 09/10/20 1024  . ceFEPime (MAXIPIME) IV 2 g (09/15/20 0548)  . feeding supplement (OSMOLITE 1.2 CAL) 1,000 mL (09/15/20 0231)  . [START ON 09/16/2020] vancomycin       LOS: 19 days    Time spent: over 30 min    Fayrene Helper, MD [facility]   To contact the attending provider between 7A-7P or the covering provider during after hours 7P-7A, please log into the web site [email or web address] and access using universal [place] password for that web site. If you do not have the password, please call the hospital operator.  09/15/2020, 2:18 PM

## 2020-09-15 NOTE — Progress Notes (Signed)
Patient ID: Charlene [name], female   DOB: [birth year], 55 y.o.   MRN: [id number]  Medical records reviewed   55 year old female with past medical history as below, which is significant for uncontrolled  hypertension and reported medication noncompliance.  Presented to the emergency department at [facility] 2/9 with c/o headache and left-sided weakness.  STAT CT Head demonstrated right thalamic/basal ganglia hemorrhage with intraventricular extension.  She was transferred to [facility] emergency department for in person neurology evaluation. There was evidence of obstructive hydrocephalus and neurosurgery was consulted. External ventricular drain was placed 2/10. In the hours following she became increasingly obtunded. PCCM was consulted. Upon PCCM arrival the patient became apneic and was intubated. EVD removed 2/13     Family meeting held 09-03-20     Trach/PEG 09-10-20  Today is day  19 of this hospitalization.  Patient remains unresponsive, unable to follow commands, with extensor posturing, unresponsive to gentle touch and verbal stimuli.  Likely long term poor prognosis for meaningful recovery.  Case discussed with medical team   Patient's sister/[name] at bedside.   Information and education  regarding diagnosis, prognosis, treatment options/ decisions and anticipatory care needs offered.   Questions and concerns addressed in detail.  Family remains hopeful, faith and prayer are foundational for family   Emotional support and therapeutic listening.  Total time spent on the unit was 35 minutes   PMT will continue to support holistically  Greater than 50% of the time was spent in counseling and coordination of care   Lorinda Creed NP  Palliative Medicine Team Team Phone # [phone] Pager [phone]

## 2020-09-15 NOTE — Progress Notes (Signed)
Pharmacy Antibiotic Note  Charlene [name] is a 55 y.o. female admitted on 08/26/2020 with pneumonia.  Pharmacy has been consulted for vanc/cefepime dosing.  Pt has been here since 2/9 for a hemorrhagic stroke. She was just transferred out from the ICU. She has been on cefepime for her serratia PNA until 3/5. Vanc has been ordered to be added to cefepime.   Scr 0.9 WBC 10.7  Plan: Vanc 1.25g IV q24>>AUC 418, scr 0.9 Cefepime 2g IV q8  Height: 5\' 5"  (165.1 cm) Weight: 70.8 kg (156 lb 1.4 oz) IBW/kg (Calculated) : 57  Temp (24hrs), Avg:99.7 F (37.6 C), Min:97.8 F (36.6 C), Max:103.1 F (39.5 C)  Recent Labs  Lab 09/09/20 0438 09/10/20 2328 09/11/20 0549 09/11/20 1412 09/12/20 0037 09/14/20 0357 09/15/20 0856  WBC 7.3 7.5 8.7  --  9.1 9.4 10.7*  CREATININE 0.97  --  0.98  --  0.79 0.68 0.90  LATICACIDVEN  --   --   --  1.2  --   --   --     Estimated Creatinine Clearance: 71.3 mL/min (by C-G formula based on SCr of 0.9 mg/dL).    No Known Allergies  Antimicrobials this admission: Vanc 2/12 >>2/15, x1 2/20 3/1>> Cefepime 2/12 >>2/15; restart 2/27 >> (3/5) Cefazolin 2/19 >> 2/27  Dose adjustments this admission:   Microbiology results: 2/10 MRSA PCR - negative 2/12 BCx - negative 2/19 BCx - Staph epi and hominis, likely a contaminant 2/25 RCx - abundant serratia (pan-S, except R-cefazolin) 2/25 BCx - ngtd 3/1 urine>> 3/1 blood>> 3/1 MRSA PCR>>  3/25, PharmD, BCIDP, AAHIVP, CPP Infectious Disease Pharmacist 09/15/2020 12:31 PM

## 2020-09-15 NOTE — Progress Notes (Signed)
Physical Therapy Treatment Patient Details Name: Charlene [name] MRN: [id number] DOB: 05/07/1966 Today's Date: 09/15/2020    History of Present Illness Pt is 55 yo female presents to [facility] on 2/9 with L sided weakness and headaches. PMH includes anxiety, HTN. 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 s/p Right frontal ventricular catheter placement on 2/10, stopped functioning on 2/11.  MRI 2/11 shows right greater than left mesial temporal lobes / parahippocampal cortex, bilateral basal ganglia, and possibly the upper pons that are concerning for acute infarcts, most likely secondary to mass effect from the hemorrhage. ETT 2/10-present.  Pt with trach and PEG on 09/10/20    PT Comments    Pt with no significant changes.  Continues to present with extensor tone, unresponsive, and only withdraws to painful stimuli.  Pt tolerated LE ROM but withdrew to any attempts and UE movement with increase in HR and BP.  With LE pt able to achieve neutral dorsiflexion with stretching, did demonstrate clonus.  Demonstrates mild tone throughout LE.  Pt does have PRAFOs - with good fit, no skin breakdown or pressure points.  Will continue to check on pt weekly to assess for changes .   Follow Up Recommendations  SNF;Supervision/Assistance - 24 hour     Equipment Recommendations  Hospital bed    Recommendations for Other Services       Precautions / Restrictions Precautions Precautions: Fall;Other (comment) (trach and PEG) Precaution Comments: watch BP and HR    Mobility  Bed Mobility               General bed mobility comments: No purposeful movement.  Moderate extensor tone overall.  Unable to perform transfers    Transfers                    Ambulation/Gait                 Stairs             Wheelchair Mobility    Modified  [place] (Stroke Patients Only)       Balance       Sitting balance - Comments: unable                                    Cognition Arousal/Alertness:  (unresponsive) Behavior During Therapy: Flat affect Overall Cognitive Status: Difficult to assess                                 General Comments: Pt unresponsive, trach, does not open eyes.  Only withdrawls to pain/UE touch movement.  Pt tolerated LE movement.  She withdrew or resisted R UE movement.  HR and BP elevated so did not attempt L.  HR and BP increasing (HR 122 and BP 160/128)  At rest HR 100 bpm and BP 137/90      Exercises Other Exercises Other Exercises: PROM x 10 bil LE heel slides, hip abd/add, hip IR/ER : noting mild tone Other Exercises: Heel cord stretch 30 xsec x 2 ; bil; able to get to neutral Other Exercises: R UE Attempted ROM but pt withdrawing or resisting; HR and BP increasing (HR 122 and BP 160/128) so did not attempt L UE Other  Exercises: Keeping head turned  L; gentle R rotation stretch x 2 for 30 sec    General Comments General comments (skin integrity, edema, etc.): Pt with moderate extensor tone and unresponsive.  Unsafe to participate with bed mobility      Pertinent Vitals/Pain Pain Assessment: Faces Faces Pain Scale: Hurts a little bit Pain Location: Withdrawls with UE movement Pain Descriptors / Indicators: Grimacing Pain Intervention(s): Monitored during session    Home Living                      Prior Function            PT Goals (current goals can now be found in the care plan section) Acute Rehab PT Goals Patient Stated Goal: pt unable, non responsive PT Goal Formulation: Patient unable to participate in goal setting Time For Goal Achievement: 09/25/20 Potential to Achieve Goals: Poor Progress towards PT goals: Not progressing toward goals - comment (extensor posturing only)    Frequency    Min 1X/week      PT Plan Current plan  remains appropriate    Co-evaluation              AM-PAC PT "6 Clicks" Mobility   Outcome Measure  Help needed turning from your back to your side while in a flat bed without using bedrails?: Total Help needed moving from lying on your back to sitting on the side of a flat bed without using bedrails?: Total Help needed moving to and from a bed to a chair (including a wheelchair)?: Total Help needed standing up from a chair using your arms (e.g., wheelchair or bedside chair)?: Total Help needed to walk in hospital room?: Total Help needed climbing 3-5 steps with a railing? : Total 6 Click Score: 6    End of Session Equipment Utilized During Treatment: Oxygen Activity Tolerance: Treatment limited secondary to medical complications (Comment) Patient left: in bed;with call bell/phone within reach;with bed alarm set;with SCD's reapplied;Other (comment) (PRAFOs in place) Nurse Communication: Mobility status (vitals) PT Visit Diagnosis: Hemiplegia and hemiparesis;Other symptoms and signs involving the nervous system (R29.898);Other abnormalities of gait and mobility (R26.89) Hemiplegia - caused by: Cerebral infarction;Nontraumatic intracerebral hemorrhage;Other Nontraumatic intracranial hemorrhage     Time: 1400-1415 PT Time Calculation (min) (ACUTE ONLY): 15 min  Charges:  $Therapeutic Exercise: 8-22 mins                     Anise Salvo, PT Acute Rehab Services Pager [phone] [facility] [phone]     Rayetta Humphrey 09/15/2020, 2:32 PM

## 2020-09-16 LAB — CBC WITH DIFFERENTIAL/PLATELET
Abs Immature Granulocytes: 0.03 10*3/uL (ref 0.00–0.07)
Basophils Absolute: 0 10*3/uL (ref 0.0–0.1)
Basophils Relative: 0 %
Eosinophils Absolute: 0.2 10*3/uL (ref 0.0–0.5)
Eosinophils Relative: 2 %
HCT: 27.7 % — ABNORMAL LOW (ref 36.0–46.0)
Hemoglobin: 9 g/dL — ABNORMAL LOW (ref 12.0–15.0)
Immature Granulocytes: 0 %
Lymphocytes Relative: 13 %
Lymphs Abs: 1.3 10*3/uL (ref 0.7–4.0)
MCH: 26 pg (ref 26.0–34.0)
MCHC: 32.5 g/dL (ref 30.0–36.0)
MCV: 80.1 fL (ref 80.0–100.0)
Monocytes Absolute: 0.6 10*3/uL (ref 0.1–1.0)
Monocytes Relative: 6 %
Neutro Abs: 7.8 10*3/uL — ABNORMAL HIGH (ref 1.7–7.7)
Neutrophils Relative %: 79 %
Platelets: 467 10*3/uL — ABNORMAL HIGH (ref 150–400)
RBC: 3.46 MIL/uL — ABNORMAL LOW (ref 3.87–5.11)
RDW: 17.2 % — ABNORMAL HIGH (ref 11.5–15.5)
WBC: 10.1 10*3/uL (ref 4.0–10.5)
nRBC: 0 % (ref 0.0–0.2)

## 2020-09-16 LAB — COMPREHENSIVE METABOLIC PANEL
ALT: 62 U/L — ABNORMAL HIGH (ref 0–44)
AST: 66 U/L — ABNORMAL HIGH (ref 15–41)
Albumin: 1.9 g/dL — ABNORMAL LOW (ref 3.5–5.0)
Alkaline Phosphatase: 108 U/L (ref 38–126)
Anion gap: 11 (ref 5–15)
BUN: 19 mg/dL (ref 6–20)
CO2: 24 mmol/L (ref 22–32)
Calcium: 8.9 mg/dL (ref 8.9–10.3)
Chloride: 106 mmol/L (ref 98–111)
Creatinine, Ser: 0.86 mg/dL (ref 0.44–1.00)
GFR, Estimated: >60 mL/min (ref >60)
Glucose, Bld: 132 mg/dL — ABNORMAL HIGH (ref 70–99)
Potassium: 4.4 mmol/L (ref 3.5–5.1)
Sodium: 141 mmol/L (ref 135–145)
Total Bilirubin: 0.5 mg/dL (ref 0.3–1.2)
Total Protein: 6.4 g/dL — ABNORMAL LOW (ref 6.5–8.1)

## 2020-09-16 LAB — CULTURE, BLOOD (ROUTINE X 2)
Culture: NO GROWTH
Culture: NO GROWTH
Special Requests: ADEQUATE
Special Requests: ADEQUATE

## 2020-09-16 LAB — GLUCOSE, CAPILLARY
Glucose-Capillary: 121 mg/dL — ABNORMAL HIGH (ref 70–99)
Glucose-Capillary: 127 mg/dL — ABNORMAL HIGH (ref 70–99)
Glucose-Capillary: 105 mg/dL — ABNORMAL HIGH (ref 70–99)
Glucose-Capillary: 126 mg/dL — ABNORMAL HIGH (ref 70–99)
Glucose-Capillary: 97 mg/dL (ref 70–99)
Glucose-Capillary: 133 mg/dL — ABNORMAL HIGH (ref 70–99)

## 2020-09-16 LAB — MAGNESIUM: Magnesium: 2.1 mg/dL (ref 1.7–2.4)

## 2020-09-16 LAB — PHOSPHORUS: Phosphorus: 4.5 mg/dL (ref 2.5–4.6)

## 2020-09-16 NOTE — Progress Notes (Signed)
Repeat BP has been taken. MD notified that first and second line of BP PRN med has been taken and cannot be taken again due to the q4hrs time limit. For the sustained tachycardia MD is aware and says that it is due to the high temperature occuring as well. MD advised to notify if the SBP gets high again and an additional order may be added as a response.

## 2020-09-16 NOTE — Progress Notes (Signed)
PROGRESS NOTE  Charlene [name] GBT:[id number] DOB: [birth year] DOA: 08/26/2020 PCP: Patient, [name]   LOS: 20 days   Brief Narrative / Interim history: 55 yo F with 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  Subsequently on 2/10, she developed obtundation and respiratory arrest leading to intubation.    2/09 Admit for [place] 2/10 EVD placed, later intubated due to obtundation and respiratory arrest 2/11 MRI Brain, hypertonic saline d/c'd, EVD not working. Husband did not consent to PICC  2/13 EVD removed 2/17 [place] discussion with family, PMT 2/24 Bedside trach and PEG 3/1 Transferred to [place]  Subjective / 24h Interval events: Unresponsive.  Assessment & Plan: Principal Problem Multiple acute strokes  Right Thalamus ICH with IVH  Obstructive Hydrocephalus Acute Metabolic Encephalopathy - CT 2/9 with acute hemorrhage with epicenter in the right thalamus.  Intraventricular penetration with blood filling the 3rd and forth ventricles and nearly filling the right lateral ventricle. MRI 2/11 with intraparenchymal hemorrhage centered within the right thalamus and basal ganglia extending into the right midbrain with intraventricular extension.  Effacement of prepontine cistern and similar 4 mm of leftward midline shift.  Separate areas of ristricted diffusion and edema involving the right greater than left mesial temporal lobes/parahippocampal cortex, bilateral basal ganglia, and possibly the upper pons that are concerning for acute infarcts, most likely 2/2 mass effect from the hemorrhage (see report)/ Per neuro "scattered b/l hippocampus, posterior CC, bilateral thalamus, and upper pons, likely due to deep penetrating vessel compression by focal mass-effect".  2/19 MRI with acute to early subacute infarcts in the bilateral cerebral white matter and right cerebellum, new from 2/11 - interval evolution of multiple  other infarcts present on prior MRI, increased hemorrhage along old right EVD tract (see report) - 2/21 head CT with expected evolution of ICH since 2/13 - echo with normal EF, no intracardiac source of embolism (see report) - CTA head/neck without intracranial arterial occlusion or high grade stenosis, no dissection, aneurysm, or hemodynamically significant stenosis of the carotid or vertebral arteries - per neurology - see last follow up note from 2/25 - ICH likely 2/2 hypertension and nonadherence to meds - s/p trach/peg on 2/24 - no antithrombotic 2/2 ICH - for obstructive hydrocephalus -> s/p external ventricular catheter placement on 08/27/20 by neurosurgery - EVD clamped on 2/12 and d/c'd on 2/13 - EEG suggestive of cortical dysfunction in right hemisphere likely 2/2 underlying bleed as well as severe diffuse encephalopathy, nonspecific etiology  - only withdraws to painful stimuli - will need to continue goals of care/palliative  - seizure precautions, aspiration precautions, nutrition, bowel regimen  Active Problems Hypertensive emergency -Home medications include Coreg and HCTZ -currently on metoprolol  -hydral and labetalol prn -SBP goal remains < 160  Fever  Serratia Pneumonia  Hospital Acquired Pneumonia:  -Tracheal aspirate from 2/25 with serratia, currently back on antibiotics with vancomycin and cefepime due to persistent fever.  Repeat respiratory cultures with GPC's and GNR's, final pending.  Blood cultures are negative -There is also concern for central fever, started on the sputum -CXR with bilateral lower lobe opacities (L>R concerning for pneumonia).  Follow UA, urine culture. MRSA PCR.   Acute hypoxemic respiratory failure secondary to CVA -S/P Trach -continue on trach collar -pulm continuing to follow for trach  Dysphagia -s/p peg on tube feeds  Possible bacterial sinusitis -on broad spectrum abx  Bacterial Conjunctivitis -left eye, significant upper  eyelid swelling as well -continue erythromycin  ointment -continue to monitor  Anasarca -bilateral upper extremity edema, minimal LE edema - suspect related to prolonged hospitalization, hypoalbuminemia -follow upper extremity US -> age indeterminate SVT of right cephalic vein  Elevated LFT's -mild, continue to monitor  Leukocytosis -mild uptrend -follow on abx  Anemia -Relatively stable, follow  Scheduled Meds: .  stroke: mapping our early stages of recovery book   Does not apply Once  . atorvastatin  40 mg Per Tube Daily  . chlorhexidine gluconate (MEDLINE KIT)  15 mL Mouth Rinse BID  . Chlorhexidine Gluconate Cloth  6 each Topical Daily  . enoxaparin (LOVENOX) injection  40 mg Subcutaneous Q24H  . erythromycin  1 application Left Eye T4H  . feeding supplement (PROSource TF)  45 mL Per Tube Daily  . free water  100 mL Per Tube Q4H  . insulin aspart  0-9 Units Subcutaneous Q4H  . mouth rinse  15 mL Mouth Rinse 10 times per day  . metoprolol tartrate  25 mg Per Tube BID  . pantoprazole sodium  40 mg Per Tube BID  . scopolamine  1 patch Transdermal Q72H   Continuous Infusions: . sodium chloride 100 mL/hr at 09/10/20 1024  . ceFEPime (MAXIPIME) IV 2 g (09/16/20 0530)  . feeding supplement (OSMOLITE 1.2 CAL) 1,000 mL (09/15/20 0231)  . vancomycin     PRN Meds:.sodium chloride, acetaminophen **OR** acetaminophen (TYLENOL) oral liquid 160 mg/5 mL **OR** acetaminophen, hydrALAZINE, labetalol, oxyCODONE, polyethylene glycol, senna-docusate  Diet Orders (From admission, onward)    Start     Ordered   09/10/20 0001  Diet NPO time specified  Diet effective midnight        09/09/20 1421          DVT prophylaxis: enoxaparin (LOVENOX) injection 40 mg Start: 09/01/20 1600 SCD's Start: 08/27/20 0026     Code Status: Full Code  Family Communication: no family at bedside  Status is: Inpatient  Remains inpatient appropriate because:Inpatient level of care appropriate due  to severity of illness   Dispo: The patient is from: Home              Anticipated d/c is to: SNF              Patient currently is not medically stable to d/c.   Difficult to place patient No   Level of care: Progressive  Consultants:  Palliative care Pulmonary  Neurology   Procedures:  Lurline Idol, PEG  Microbiology  None   Antimicrobials: Vancomycin / Cefeipme     Objective: Vitals:   09/16/20 0008 09/16/20 0100 09/16/20 0424 09/16/20 0500  BP:  110/69    Pulse: (!) 114 (!) 104 (!) 115   Resp: (!) 28 19 (!) 21   Temp:  99 F (37.2 C)    TempSrc:  Oral    SpO2: 100%  100%   Weight:    70.9 kg  Height:        Intake/Output Summary (Last 24 hours) at 09/16/2020 0615 Last data filed at 09/16/2020 0152 Gross per 24 hour  Intake 0 ml  Output 1400 ml  Net -1400 ml   Filed Weights   09/14/20 0500 09/15/20 0405 09/16/20 0500  Weight: 67.4 kg 70.8 kg 70.9 kg    Examination:  Constitutional: unresponsive  Eyes: no scleral icterus ENMT: Mucous membranes are moist.  Neck: normal, supple Respiratory: Shallow respirations, diminished overall, no wheezing  Cardiovascular: Regular rate and rhythm, no murmurs / rubs / gallops. No LE edema.  Abdomen:  non distended, no tenderness. Bowel sounds positive.  Musculoskeletal: no clubbing / cyanosis.  Skin: no rashes Neurologic: Withdraws to pain, does not follow commands   Data Reviewed: I have independently reviewed following labs and imaging studies   CBC: Recent Labs  Lab 09/11/20 0549 09/12/20 0037 09/12/20 0638 09/14/20 0357 09/15/20 0856 09/16/20 0353  WBC 8.7 9.1  --  9.4 10.7* 10.1  NEUTROABS  --   --   --   --  8.0* 7.8*  HGB 8.0* 6.7* 8.4* 9.7* 9.0* 9.0*  HCT 24.9* 20.3* 25.3* 29.3* 28.3* 27.7*  MCV 80.1 79.6*  --  79.0* 80.6 80.1  PLT 570* 467*  --  479* 481* 543*   Basic Metabolic Panel: Recent Labs  Lab 09/11/20 0549 09/12/20 0037 09/14/20 0357 09/15/20 0856 09/16/20 0353  NA 143 140 140 140  141  K 4.0 3.8 4.1 4.3 4.4  CL 101 103 104 104 106  CO2 33* '28 24 25 24  ' GLUCOSE 170* 162* 135* 137* 132*  BUN 31* 25* '15 19 19  ' CREATININE 0.98 0.79 0.68 0.90 0.86  CALCIUM 8.5* 8.2* 8.7* 8.7* 8.9  MG  --  2.1  --  2.0 2.1  PHOS  --   --   --  4.4 4.5   Liver Function Tests: Recent Labs  Lab 09/15/20 0856 09/16/20 0353  AST 58* 66*  ALT 57* 62*  ALKPHOS 113 108  BILITOT 0.3 0.5  PROT 6.2* 6.4*  ALBUMIN 1.8* 1.9*   Coagulation Profile: Recent Labs  Lab 09/10/20 0236  INR 1.2   HbA1C: No results for input(s): HGBA1C in the last 72 hours. CBG: Recent Labs  Lab 09/15/20 1224 09/15/20 1620 09/15/20 2025 09/15/20 2310 09/16/20 0414  GLUCAP 126* 120* 111* 127* 121*    Recent Results (from the past 240 hour(s))  Culture, blood (routine x 2)     Status: None (Preliminary result)   Collection Time: 09/11/20  2:15 PM   Specimen: BLOOD LEFT HAND  Result Value Ref Range Status   Specimen Description BLOOD LEFT HAND  Final   Special Requests   Final    BOTTLES DRAWN AEROBIC AND ANAEROBIC Blood Culture adequate volume   Culture   Final    NO GROWTH 4 DAYS Performed at [facility], [place] [address], [place], [place] [address]    Report Status PENDING  Incomplete  Culture, blood (routine x 2)     Status: None (Preliminary result)   Collection Time: 09/11/20  2:25 PM   Specimen: BLOOD LEFT FOREARM  Result Value Ref Range Status   Specimen Description BLOOD LEFT FOREARM  Final   Special Requests   Final    BOTTLES DRAWN AEROBIC AND ANAEROBIC Blood Culture adequate volume   Culture   Final    NO GROWTH 4 DAYS Performed at [facility], [place] [address], [place], [place] [address]    Report Status PENDING  Incomplete  Culture, Respiratory w Gram Stain     Status: None   Collection Time: 09/11/20  2:39 PM   Specimen: Tracheal Aspirate; Respiratory  Result Value Ref Range Status   Specimen Description TRACHEAL ASPIRATE  Final   Special Requests Normal  Final    Gram Stain   Final    ABUNDANT WBC PRESENT,BOTH PMN AND MONONUCLEAR MODERATE GRAM NEGATIVE RODS RARE GRAM VARIABLE ROD Performed at [facility], [place] [address], [place], [place] [address]    Culture [facility] MARCESCENS  Final  Report Status 09/13/2020 FINAL  Final   Organism ID, Bacteria SERRATIA MARCESCENS  Final      Susceptibility   Serratia marcescens - MIC*    CEFAZOLIN >=64 RESISTANT Resistant     CEFEPIME <=0.12 SENSITIVE Sensitive     CEFTAZIDIME <=1 SENSITIVE Sensitive     CEFTRIAXONE <=0.25 SENSITIVE Sensitive     CIPROFLOXACIN <=0.25 SENSITIVE Sensitive     GENTAMICIN <=1 SENSITIVE Sensitive     TRIMETH/SULFA <=20 SENSITIVE Sensitive     * ABUNDANT SERRATIA MARCESCENS  Culture, Respiratory w Gram Stain     Status: None (Preliminary result)   Collection Time: 09/15/20 11:28 AM   Specimen: Tracheal Aspirate; Respiratory  Result Value Ref Range Status   Specimen Description TRACHEAL ASPIRATE  Final   Special Requests NONE  Final   Gram Stain   Final    ABUNDANT WBC PRESENT,BOTH PMN AND MONONUCLEAR FEW SQUAMOUS EPITHELIAL CELLS PRESENT ABUNDANT GRAM NEGATIVE RODS FEW GRAM POSITIVE COCCI Performed at [facility], [place] [address] [place], [place] [address]    Culture PENDING  Incomplete   Report Status PENDING  Incomplete     Radiology Studies: DG CHEST PORT 1 VIEW  Result Date: 09/15/2020 CLINICAL DATA:  Fever EXAM: PORTABLE CHEST 1 VIEW COMPARISON:  09/10/2020 FINDINGS: Tracheostomy is unchanged. Heart is normal size. Bilateral lower lobe airspace opacities, left greater than right concerning for pneumonia. Possible small layering left effusion. No pneumothorax. No acute bony abnormality. IMPRESSION: Bilateral lower lobe opacities, left greater than right concerning for pneumonia. Suspect small layering left effusion. Electronically Signed   By: Rolm Baptise M.D.   On: 09/15/2020 08:52   VAS [name] UPPER EXTREMITY VENOUS DUPLEX  Result Date:  09/15/2020 UPPER VENOUS STUDY  Indications: BUE edema Anticoagulation: Unable to use blood thinners due to medical condition. Limitations: Difficult and limited exam due to patient immobility and inability to cooperate with exam (AMS). Comparison Study: no previous exams. Performing Technologist: Rogelia Rohrer  Examination Guidelines: A complete evaluation includes B-mode imaging, spectral Doppler, color Doppler, and power Doppler as needed of all accessible portions of each vessel. Bilateral testing is considered an integral part of a complete examination. Limited examinations for reoccurring indications may be performed as noted.  Right Findings: +----------+------------+---------+-----------+----------+-----------------+ RIGHT     CompressiblePhasicitySpontaneousProperties     Summary      +----------+------------+---------+-----------+----------+-----------------+ IJV           Full       Yes       Yes                                +----------+------------+---------+-----------+----------+-----------------+ Subclavian               Yes       Yes                                +----------+------------+---------+-----------+----------+-----------------+ Axillary      Full       Yes       Yes                                +----------+------------+---------+-----------+----------+-----------------+ Brachial      Full       Yes       Yes                                +----------+------------+---------+-----------+----------+-----------------+  Radial        Full                                                    +----------+------------+---------+-----------+----------+-----------------+ Ulnar         Full                                                    +----------+------------+---------+-----------+----------+-----------------+ Cephalic      None       No        No               Age Indeterminate  +----------+------------+---------+-----------+----------+-----------------+ Basilic       Full       Yes       Yes                                +----------+------------+---------+-----------+----------+-----------------+  Left Findings: +----------+------------+---------+-----------+----------+---------------------+ LEFT      CompressiblePhasicitySpontaneousProperties       Summary        +----------+------------+---------+-----------+----------+---------------------+ IJV                                                 Not seen due to trach                                                      collar and patient                                                             position        +----------+------------+---------+-----------+----------+---------------------+ Subclavian               Yes       Yes                                    +----------+------------+---------+-----------+----------+---------------------+ Axillary      Full       Yes       Yes                                    +----------+------------+---------+-----------+----------+---------------------+ Brachial      Full       Yes       Yes                                    +----------+------------+---------+-----------+----------+---------------------+ Radial  Full                                                        +----------+------------+---------+-----------+----------+---------------------+ Ulnar         Full                                                        +----------+------------+---------+-----------+----------+---------------------+ Cephalic      Full                                                        +----------+------------+---------+-----------+----------+---------------------+ Basilic       Full       Yes       Yes                                    +----------+------------+---------+-----------+----------+---------------------+   Summary: No evidence of deep vein thrombosis involving the right and left upper extremities. Right: Findings consistent with age indeterminate superficial vein thrombosis involving the right cephalic vein.  Left: No evidence of superficial vein thrombosis in the upper extremity.  *See table(s) above for measurements and observations.  Diagnosing physician: Curt Jews MD Electronically signed by Curt Jews MD on 09/15/2020 at 7:15:40 PM.    Final    Marzetta Board, MD, PhD [facility]  Between 7 am - 7 pm I am available, please contact me via Amion or Securechat  Between 7 pm - 7 am I am not available, please contact night coverage MD/APP via Amion

## 2020-09-16 NOTE — Progress Notes (Signed)
PRN medication given for high systolic BP. RN will continue to monitor

## 2020-09-16 NOTE — Progress Notes (Addendum)
Subjective No overnight changes. Family at bedside. RT notes some nasal secretions thought to be secondary to    Objective Today's Vitals   09/16/20 0900 09/16/20 1028 09/16/20 1102 09/16/20 1155  BP:  (!) 150/100 (!) 150/94   Pulse:  (!) 119 (!) 106 (!) 110  Resp:   (!) 26 (!) 21  Temp:   99.8 F (37.7 C)   TempSrc:   Axillary   SpO2: 100%  100% 100%  Weight:      Height:      PainSc:       Body mass index is 26 kg/m.  Non-responsive flex shiley with 7.5 mm ID with inner cannula Few yellowish secretions from trach No respiratory distress  Assessment and plan Acute respiratory failure S/p Tracheostomy ICH CVA  Continue trach care, bronchodilators Continue scopolamine patch which was started to help thicken oral secretions. If we're having trouble suctioning her trach secretions, would stop this.  I removed her trach sutures today  We will follow for trach care.   Durel Salts, MD Pulmonary and Critical Care Medicine [facility] Pager: see AMION Office:[phone]

## 2020-09-16 NOTE — Progress Notes (Signed)
BP taken a the first attempt MD notified. RN will take a repeat BP.

## 2020-09-17 LAB — CBC
HCT: 25.7 % — ABNORMAL LOW (ref 36.0–46.0)
Hemoglobin: 8.3 g/dL — ABNORMAL LOW (ref 12.0–15.0)
MCH: 26 pg (ref 26.0–34.0)
MCHC: 32.3 g/dL (ref 30.0–36.0)
MCV: 80.6 fL (ref 80.0–100.0)
Platelets: 411 10*3/uL — ABNORMAL HIGH (ref 150–400)
RBC: 3.19 MIL/uL — ABNORMAL LOW (ref 3.87–5.11)
RDW: 17.1 % — ABNORMAL HIGH (ref 11.5–15.5)
WBC: 9.8 10*3/uL (ref 4.0–10.5)
nRBC: 0 % (ref 0.0–0.2)

## 2020-09-17 LAB — COMPREHENSIVE METABOLIC PANEL
ALT: 55 U/L — ABNORMAL HIGH (ref 0–44)
AST: 50 U/L — ABNORMAL HIGH (ref 15–41)
Albumin: 1.7 g/dL — ABNORMAL LOW (ref 3.5–5.0)
Alkaline Phosphatase: 101 U/L (ref 38–126)
Anion gap: 11 (ref 5–15)
BUN: 18 mg/dL (ref 6–20)
CO2: 23 mmol/L (ref 22–32)
Calcium: 8.5 mg/dL — ABNORMAL LOW (ref 8.9–10.3)
Chloride: 106 mmol/L (ref 98–111)
Creatinine, Ser: 0.82 mg/dL (ref 0.44–1.00)
GFR, Estimated: >60 mL/min (ref >60)
Glucose, Bld: 135 mg/dL — ABNORMAL HIGH (ref 70–99)
Potassium: 4 mmol/L (ref 3.5–5.1)
Sodium: 140 mmol/L (ref 135–145)
Total Bilirubin: 0.4 mg/dL (ref 0.3–1.2)
Total Protein: 5.9 g/dL — ABNORMAL LOW (ref 6.5–8.1)

## 2020-09-17 LAB — GLUCOSE, CAPILLARY
Glucose-Capillary: 122 mg/dL — ABNORMAL HIGH (ref 70–99)
Glucose-Capillary: 132 mg/dL — ABNORMAL HIGH (ref 70–99)
Glucose-Capillary: 135 mg/dL — ABNORMAL HIGH (ref 70–99)
Glucose-Capillary: 141 mg/dL — ABNORMAL HIGH (ref 70–99)
Glucose-Capillary: 151 mg/dL — ABNORMAL HIGH (ref 70–99)
Glucose-Capillary: 163 mg/dL — ABNORMAL HIGH (ref 70–99)

## 2020-09-17 LAB — CULTURE, RESPIRATORY W GRAM STAIN

## 2020-09-17 LAB — MAGNESIUM: Magnesium: 2.1 mg/dL (ref 1.7–2.4)

## 2020-09-17 NOTE — Progress Notes (Signed)
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  Subsequently on 2/10, she developed obtundation and respiratory arrest leading to intubation.    2/09 Admit for [place] 2/10 EVD placed, later intubated due to obtundation and respiratory arrest 2/11 MRI Brain, hypertonic saline d/c'd, EVD not working. Husband did not consent to PICC  2/13 EVD removed 2/17 [place] discussion with family, PMT 2/24 Bedside trach and PEG 3/1 Transferred to [place]  Subjective / 24h Interval events: Remains unresponsive.  Assessment & Plan: Principal Problem Multiple acute strokes  Right Thalamus ICH with IVH  Obstructive Hydrocephalus Acute Metabolic Encephalopathy - CT 2/9 with acute hemorrhage with epicenter in the right thalamus.  Intraventricular penetration with blood filling the 3rd and forth ventricles and nearly filling the right lateral ventricle. MRI 2/11 with intraparenchymal hemorrhage centered within the right thalamus and basal ganglia extending into the right midbrain with intraventricular extension.  Effacement of prepontine cistern and similar 4 mm of leftward midline shift.  Separate areas of ristricted diffusion and edema involving the right greater than left mesial temporal lobes/parahippocampal cortex, bilateral basal ganglia, and possibly the upper pons that are concerning for acute infarcts, most likely 2/2 mass effect from the hemorrhage (see report)/ Per neuro "scattered b/l hippocampus, posterior CC, bilateral thalamus, and upper pons, likely due to deep penetrating vessel compression by focal mass-effect".  2/19 MRI with acute to early subacute infarcts in the bilateral cerebral white matter and right cerebellum, new from 2/11 - interval evolution of  multiple other infarcts present on prior MRI, increased hemorrhage along old right EVD tract (see report) - 2/21 head CT with expected evolution of ICH since 2/13 - echo with normal EF, no intracardiac source of embolism (see report) - CTA head/neck without intracranial arterial occlusion or high grade stenosis, no dissection, aneurysm, or hemodynamically significant stenosis of the carotid or vertebral arteries - per neurology - see last follow up note from 2/25 - ICH likely 2/2 hypertension and nonadherence to meds - s/p trach/peg on 2/24 - no antithrombotic 2/2 ICH - for obstructive hydrocephalus -> s/p external ventricular catheter placement on 08/27/20 by neurosurgery - EVD clamped on 2/12 and d/c'd on 2/13 - EEG suggestive of cortical dysfunction in right hemisphere likely 2/2 underlying bleed as well as severe diffuse encephalopathy, nonspecific etiology  - only withdraws to painful stimuli - will need to continue goals of care/palliative  - seizure precautions, aspiration precautions, nutrition, bowel regimen  Active Problems Hypertensive emergency -Home medications include Coreg and HCTZ -currently on metoprolol  -hydral and labetalol prn -SBP goal remains < 160  Fever  Serratia Pneumonia  Hospital Acquired Pneumonia:  -Tracheal aspirate from 2/25 with serratia, currently back on antibiotics with vancomycin and cefepime due to persistent fever.  Repeat respiratory cultures with Serratia, discontinue vancomycin today. -There is also concern for central fever, started on buspirone -CXR with bilateral lower lobe opacities (L>R concerning for pneumonia).  Follow UA, urine culture. MRSA PCR.   Acute hypoxemic respiratory failure secondary to CVA -S/P Trach -continue on trach collar -pulm continuing to follow for trach -Currently on 5 L  Dysphagia -s/p peg on tube feeds, seems to be tolerating well  Possible bacterial sinusitis -on broad spectrum abx  Bacterial  Conjunctivitis -left eye, significant upper eyelid swelling as  well -continue erythromycin ointment -continue to monitor, improving  Anasarca -bilateral upper extremity edema, minimal LE edema - suspect related to prolonged hospitalization, hypoalbuminemia -upper extremity US -> age indeterminate SVT of right cephalic vein  Elevated LFT's -mild, continue to monitor overall stable  Leukocytosis -Resolved on antibiotics  Anemia -Relatively stable, follow  Scheduled Meds: .  stroke: mapping our early stages of recovery book   Does not apply Once  . atorvastatin  40 mg Per Tube Daily  . chlorhexidine gluconate (MEDLINE KIT)  15 mL Mouth Rinse BID  . Chlorhexidine Gluconate Cloth  6 each Topical Daily  . enoxaparin (LOVENOX) injection  40 mg Subcutaneous Q24H  . erythromycin  1 application Left Eye M0L  . feeding supplement (PROSource TF)  45 mL Per Tube Daily  . free water  100 mL Per Tube Q4H  . insulin aspart  0-9 Units Subcutaneous Q4H  . mouth rinse  15 mL Mouth Rinse 10 times per day  . metoprolol tartrate  25 mg Per Tube BID  . pantoprazole sodium  40 mg Per Tube BID  . scopolamine  1 patch Transdermal Q72H   Continuous Infusions: . sodium chloride 100 mL/hr at 09/10/20 1024  . ceFEPime (MAXIPIME) IV 2 g (09/17/20 0535)  . feeding supplement (OSMOLITE 1.2 CAL) 1,000 mL (09/16/20 1957)  . vancomycin 1,250 mg (09/16/20 1224)   PRN Meds:.sodium chloride, acetaminophen **OR** acetaminophen (TYLENOL) oral liquid 160 mg/5 mL **OR** acetaminophen, hydrALAZINE, labetalol, oxyCODONE, polyethylene glycol, senna-docusate  Diet Orders (From admission, onward)    Start     Ordered   09/10/20 0001  Diet NPO time specified  Diet effective midnight        09/09/20 1421          DVT prophylaxis: enoxaparin (LOVENOX) injection 40 mg Start: 09/01/20 1600 SCD's Start: 08/27/20 0026     Code Status: Full Code  Family Communication: no family at bedside  Status is:  Inpatient  Remains inpatient appropriate because:Inpatient level of care appropriate due to severity of illness   Dispo: The patient is from: Home              Anticipated d/c is to: SNF              Patient currently is not medically stable to d/c.   Difficult to place patient No   Level of care: Progressive  Consultants:  Palliative care Pulmonary  Neurology   Procedures:  Lurline Idol, PEG  Microbiology  None   Antimicrobials: Vancomycin / Cefeipme     Objective: Vitals:   09/17/20 0635 09/17/20 0731 09/17/20 0755 09/17/20 0821  BP: (!) 138/91 126/80 (!) 144/96   Pulse: 99 (!) 111 (!) 104   Resp: '19 20 15 ' (!) 24  Temp: 98.4 F (36.9 C) 99.8 F (37.7 C)    TempSrc: Oral Oral    SpO2: 100% 100% 100%   Weight:      Height:        Intake/Output Summary (Last 24 hours) at 09/17/2020 0955 Last data filed at 09/17/2020 0400 Gross per 24 hour  Intake 900 ml  Output 2500 ml  Net -1600 ml   Filed Weights   09/15/20 0405 09/16/20 0500 09/17/20 0500  Weight: 70.8 kg 70.9 kg 70.8 kg    Examination:  Constitutional: Unresponsive Eyes: No scleral icterus ENMT: mmm Neck: normal, supple Respiratory: Shallow respirations, diminished overall, no wheezing Cardiovascular: Regular rate and rhythm, no murmurs, trace edema, mild anasarca throughout Abdomen: Soft, no  apparent distention or tenderness, bowel sounds positive Musculoskeletal: no clubbing / cyanosis.  Skin: No rashes Neurologic: Withdraws to pain does not follow commands   Data Reviewed: I have independently reviewed following labs and imaging studies   CBC: Recent Labs  Lab 09/12/20 0037 09/12/20 1975 09/14/20 0357 09/15/20 0856 09/16/20 0353 09/17/20 0330  WBC 9.1  --  9.4 10.7* 10.1 9.8  NEUTROABS  --   --   --  8.0* 7.8*  --   HGB 6.7* 8.4* 9.7* 9.0* 9.0* 8.3*  HCT 20.3* 25.3* 29.3* 28.3* 27.7* 25.7*  MCV 79.6*  --  79.0* 80.6 80.1 80.6  PLT 467*  --  479* 481* 467* 883*   Basic Metabolic  Panel: Recent Labs  Lab 09/12/20 0037 09/14/20 0357 09/15/20 0856 09/16/20 0353 09/17/20 0330  NA 140 140 140 141 140  K 3.8 4.1 4.3 4.4 4.0  CL 103 104 104 106 106  CO2 '28 24 25 24 23  ' GLUCOSE 162* 135* 137* 132* 135*  BUN 25* '15 19 19 18  ' CREATININE 0.79 0.68 0.90 0.86 0.82  CALCIUM 8.2* 8.7* 8.7* 8.9 8.5*  MG 2.1  --  2.0 2.1 2.1  PHOS  --   --  4.4 4.5  --    Liver Function Tests: Recent Labs  Lab 09/15/20 0856 09/16/20 0353 09/17/20 0330  AST 58* 66* 50*  ALT 57* 62* 55*  ALKPHOS 113 108 101  BILITOT 0.3 0.5 0.4  PROT 6.2* 6.4* 5.9*  ALBUMIN 1.8* 1.9* 1.7*   Coagulation Profile: No results for input(s): INR, PROTIME in the last 168 hours. HbA1C: No results for input(s): HGBA1C in the last 72 hours. CBG: Recent Labs  Lab 09/16/20 1850 09/16/20 2022 09/16/20 2316 09/17/20 0350 09/17/20 0734  GLUCAP 126* 97 133* 141* 132*    Recent Results (from the past 240 hour(s))  Culture, blood (routine x 2)     Status: None   Collection Time: 09/11/20  2:15 PM   Specimen: BLOOD LEFT HAND  Result Value Ref Range Status   Specimen Description BLOOD LEFT HAND  Final   Special Requests   Final    BOTTLES DRAWN AEROBIC AND ANAEROBIC Blood Culture adequate volume   Culture   Final    NO GROWTH 5 DAYS Performed at [facility], [place] [address] [place], [place] [address]    Report Status 09/16/2020 FINAL  Final  Culture, blood (routine x 2)     Status: None   Collection Time: 09/11/20  2:25 PM   Specimen: BLOOD LEFT FOREARM  Result Value Ref Range Status   Specimen Description BLOOD LEFT FOREARM  Final   Special Requests   Final    BOTTLES DRAWN AEROBIC AND ANAEROBIC Blood Culture adequate volume   Culture   Final    NO GROWTH 5 DAYS Performed at [facility], [place] [address] [place], [place] [address]    Report Status 09/16/2020 FINAL  Final  Culture, Respiratory w Gram Stain     Status: None   Collection Time: 09/11/20  2:39 PM   Specimen:  Tracheal Aspirate; Respiratory  Result Value Ref Range Status   Specimen Description TRACHEAL ASPIRATE  Final   Special Requests Normal  Final   Gram Stain   Final    ABUNDANT WBC PRESENT,BOTH PMN AND MONONUCLEAR MODERATE GRAM NEGATIVE RODS RARE GRAM VARIABLE ROD Performed at [facility], [place] [address] [place], [place] [address]    Culture ABUNDANT SERRATIA  MARCESCENS  Final   Report Status 09/13/2020 FINAL  Final   Organism ID, Bacteria SERRATIA MARCESCENS  Final      Susceptibility   Serratia marcescens - MIC*    CEFAZOLIN >=64 RESISTANT Resistant     CEFEPIME <=0.12 SENSITIVE Sensitive     CEFTAZIDIME <=1 SENSITIVE Sensitive     CEFTRIAXONE <=0.25 SENSITIVE Sensitive     CIPROFLOXACIN <=0.25 SENSITIVE Sensitive     GENTAMICIN <=1 SENSITIVE Sensitive     TRIMETH/SULFA <=20 SENSITIVE Sensitive     * ABUNDANT SERRATIA MARCESCENS  Culture, blood (routine x 2)     Status: None (Preliminary result)   Collection Time: 09/15/20  8:57 AM   Specimen: BLOOD  Result Value Ref Range Status   Specimen Description BLOOD BLOOD LEFT FOREARM  Final   Special Requests   Final    BOTTLES DRAWN AEROBIC AND ANAEROBIC Blood Culture adequate volume   Culture   Final    NO GROWTH 1 DAY Performed at [facility], [address], [place], [place] [address]    Report Status PENDING  Incomplete  Culture, blood (routine x 2)     Status: None (Preliminary result)   Collection Time: 09/15/20  9:03 AM   Specimen: BLOOD LEFT HAND  Result Value Ref Range Status   Specimen Description BLOOD LEFT HAND  Final   Special Requests   Final    BOTTLES DRAWN AEROBIC AND ANAEROBIC Blood Culture adequate volume   Culture   Final    NO GROWTH 1 DAY Performed at [facility], [place] [address], [place], [place] [address]    Report Status PENDING  Incomplete  Culture, Respiratory w Gram Stain     Status: None (Preliminary result)   Collection Time: 09/15/20 11:28 AM   Specimen: Tracheal  Aspirate; Respiratory  Result Value Ref Range Status   Specimen Description TRACHEAL ASPIRATE  Final   Special Requests NONE  Final   Gram Stain   Final    ABUNDANT WBC PRESENT,BOTH PMN AND MONONUCLEAR FEW SQUAMOUS EPITHELIAL CELLS PRESENT ABUNDANT GRAM NEGATIVE RODS FEW GRAM POSITIVE COCCI    Culture   Final    FEW SERRATIA MARCESCENS SUSCEPTIBILITIES TO FOLLOW Performed at [facility], [place] [address], [place], [place] [address]    Report Status PENDING  Incomplete     Radiology Studies: No results found. Marzetta Board, MD, PhD [facility]  Between 7 am - 7 pm I am available, please contact me via Amion or Securechat  Between 7 pm - 7 am I am not available, please contact night coverage MD/APP via Amion

## 2020-09-18 LAB — GLUCOSE, CAPILLARY
Glucose-Capillary: 136 mg/dL — ABNORMAL HIGH (ref 70–99)
Glucose-Capillary: 119 mg/dL — ABNORMAL HIGH (ref 70–99)
Glucose-Capillary: 109 mg/dL — ABNORMAL HIGH (ref 70–99)
Glucose-Capillary: 100 mg/dL — ABNORMAL HIGH (ref 70–99)
Glucose-Capillary: 120 mg/dL — ABNORMAL HIGH (ref 70–99)
Glucose-Capillary: 141 mg/dL — ABNORMAL HIGH (ref 70–99)

## 2020-09-18 LAB — COMPREHENSIVE METABOLIC PANEL
ALT: 47 U/L — ABNORMAL HIGH (ref 0–44)
AST: 40 U/L (ref 15–41)
Albumin: 1.8 g/dL — ABNORMAL LOW (ref 3.5–5.0)
Alkaline Phosphatase: 90 U/L (ref 38–126)
Anion gap: 10 (ref 5–15)
BUN: 20 mg/dL (ref 6–20)
CO2: 23 mmol/L (ref 22–32)
Calcium: 8.6 mg/dL — ABNORMAL LOW (ref 8.9–10.3)
Chloride: 105 mmol/L (ref 98–111)
Creatinine, Ser: 0.83 mg/dL (ref 0.44–1.00)
GFR, Estimated: >60 mL/min (ref >60)
Glucose, Bld: 142 mg/dL — ABNORMAL HIGH (ref 70–99)
Potassium: 4.1 mmol/L (ref 3.5–5.1)
Sodium: 138 mmol/L (ref 135–145)
Total Bilirubin: 0.4 mg/dL (ref 0.3–1.2)
Total Protein: 6.2 g/dL — ABNORMAL LOW (ref 6.5–8.1)

## 2020-09-18 LAB — MAGNESIUM: Magnesium: 2 mg/dL (ref 1.7–2.4)

## 2020-09-18 LAB — CBC
HCT: 27.1 % — ABNORMAL LOW (ref 36.0–46.0)
Hemoglobin: 8.4 g/dL — ABNORMAL LOW (ref 12.0–15.0)
MCH: 25.4 pg — ABNORMAL LOW (ref 26.0–34.0)
MCHC: 31 g/dL (ref 30.0–36.0)
MCV: 81.9 fL (ref 80.0–100.0)
Platelets: 431 10*3/uL — ABNORMAL HIGH (ref 150–400)
RBC: 3.31 MIL/uL — ABNORMAL LOW (ref 3.87–5.11)
RDW: 17.3 % — ABNORMAL HIGH (ref 11.5–15.5)
WBC: 9.9 10*3/uL (ref 4.0–10.5)
nRBC: 0 % (ref 0.0–0.2)

## 2020-09-18 LAB — PHOSPHORUS: Phosphorus: 4 mg/dL (ref 2.5–4.6)

## 2020-09-18 MED ORDER — WHITE PETROLATUM EX OINT
TOPICAL_OINTMENT | CUTANEOUS | Status: AC
  Administered 2020-09-18: 0.2
  Filled 2020-09-18: qty 28.35

## 2020-09-18 NOTE — Progress Notes (Signed)
Nutrition Follow-up  DOCUMENTATION CODES:   Not applicable  INTERVENTION:   Continue Tube feeding via PEG tube: Osmolite 1.2 at 55 ml/h (1320 ml per day) 45 ml ProSource TF daily  Provides 1624 kcal, 84 gm protein, 1070 ml free water daily  100 ml free water every 4 hours    NUTRITION DIAGNOSIS:   Inadequate oral intake related to inability to eat as evidenced by NPO status. Ongoing.   GOAL:   Patient will meet greater than or equal to 90% of their needs Met with TF.   MONITOR:   TF tolerance,Vent status  REASON FOR ASSESSMENT:   Consult,Ventilator Enteral/tube feeding initiation and management  ASSESSMENT:   Pt with PMH of HTN with medication noncompliance admitted with hypertensive thalamic ICH.   Pt discussed during ICU rounds and with RN.  Pt with hypertensive R thalamic/basal ganglia ICH. Per MD unresponsive to verbal stimuli with extensor posturing to stimulation. Prognosis poor for meaningful recovery is poor.    2/10 s/p EVD placement and intubation for respiratory arrest 2/13 EVD removed 2/17 palliative care consult; family requesting tx to [facility]  2/24 s/p trach and PEG  3/1 tx to [facility]  Pt unresponsive. Pt continues on TC and receiving TF via PEG. Current regimen via PEG: Osmolite 1.2 at 55 ml/h w/ 45 ml ProSource TF daily, 1104m free water flushes Q4H. Tolerating well per RN.   UOP: [id number] 24 hours  Medications: ss novolog Q4H, protonix Labs reviewed. CBGs [id number] Diet Order:   Diet Order            Diet NPO time specified  Diet effective midnight                 EDUCATION NEEDS:   No education needs have been identified at this time  Skin:  Skin Assessment: Reviewed RN Assessment  Last BM:  3/3 type 7 via rectal tube  Height:   Ht Readings from Last 1 Encounters:  08/27/20 '5\' 5"'  (1.651 m)    Weight:   Wt Readings from Last 1 Encounters:  09/17/20 70.8 kg    Ideal Body Weight:  56.8 kg  BMI:  Body mass index is  25.98 kg/m.  Estimated Nutritional Needs:   Kcal:  1600-1800  Protein:  80-90 grams  Fluid:  >1.6L/d  AmLarkin InaMS, RD, LDN RD pager number and weekend/on-call pager number located in [facility]

## 2020-09-18 NOTE — Progress Notes (Signed)
PROGRESS NOTE  Charlene [name] TIW:[id number] DOB: March 19, 1966 DOA: 08/26/2020 PCP: Patient, [name]   LOS: 22 days   Brief Narrative / Interim history: 55 yo F with 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  Subsequently on 2/10, she developed obtundation and respiratory arrest leading to intubation.    2/09 Admit for [place] 2/10 EVD placed, later intubated due to obtundation and respiratory arrest 2/11 MRI Brain, hypertonic saline d/c'd, EVD not working. Husband did not consent to PICC  2/13 EVD removed 2/17 [place] discussion with family, PMT 2/24 Bedside trach and PEG 3/1 Transferred to [place]  Subjective / 24h Interval events: Unresponsive  Assessment & Plan: Principal Problem Multiple acute strokes  Right Thalamus ICH with IVH  Obstructive Hydrocephalus Acute Metabolic Encephalopathy - CT 2/9 with acute hemorrhage with epicenter in the right thalamus.  Intraventricular penetration with blood filling the 3rd and forth ventricles and nearly filling the right lateral ventricle. MRI 2/11 with intraparenchymal hemorrhage centered within the right thalamus and basal ganglia extending into the right midbrain with intraventricular extension.  Effacement of prepontine cistern and similar 4 mm of leftward midline shift.  Separate areas of ristricted diffusion and edema involving the right greater than left mesial temporal lobes/parahippocampal cortex, bilateral basal ganglia, and possibly the upper pons that are concerning for acute infarcts, most likely 2/2 mass effect from the hemorrhage (see report)/ Per neuro "scattered b/l hippocampus, posterior CC, bilateral thalamus, and upper pons, likely due to deep penetrating vessel compression by focal mass-effect".  2/19 MRI with acute to early subacute infarcts in the bilateral cerebral white matter and right cerebellum, new from 2/11 - interval evolution of multiple  other infarcts present on prior MRI, increased hemorrhage along old right EVD tract (see report) - 2/21 head CT with expected evolution of ICH since 2/13 - echo with normal EF, no intracardiac source of embolism (see report) - CTA head/neck without intracranial arterial occlusion or high grade stenosis, no dissection, aneurysm, or hemodynamically significant stenosis of the carotid or vertebral arteries - per neurology - see last follow up note from 2/25 - ICH likely 2/2 hypertension and nonadherence to meds - s/p trach/peg on 2/24 - no antithrombotic 2/2 ICH - for obstructive hydrocephalus -> s/p external ventricular catheter placement on 08/27/20 by neurosurgery - EVD clamped on 2/12 and d/c'd on 2/13 - EEG suggestive of cortical dysfunction in right hemisphere likely 2/2 underlying bleed as well as severe diffuse encephalopathy, nonspecific etiology  - only withdraws to painful stimuli - will need to continue goals of care/palliative  - seizure precautions, aspiration precautions, nutrition, bowel regimen -Remains poorly responsive today  Active Problems Hypertensive emergency -Home medications include Coreg and HCTZ -currently on metoprolol  -hydral and labetalol prn -SBP goal remains < 160  Fever  Serratia Pneumonia  Hospital Acquired Pneumonia:  -Tracheal aspirate from 2/25 with serratia, currently back on antibiotics with vancomycin and cefepime due to persistent fever.  Repeat respiratory cultures with Serratia, discontinue vancomycin today. -There is also concern for central fever, started on buspirone -CXR with bilateral lower lobe opacities (L>R concerning for pneumonia).    Sputum cultures growing again Serratia, sensitive to cefepime.  Finishing another course of antibiotics on 3/5  Acute hypoxemic respiratory failure secondary to CVA -S/P Trach -continue on trach collar -pulm continuing to follow for trach -Currently on 5 L  Dysphagia -s/p peg on tube feeds, seems to  continue to tolerate well  Possible bacterial  sinusitis -on broad spectrum abx  Bacterial Conjunctivitis -left eye, significant upper eyelid swelling as well -continue erythromycin ointment -continue to monitor, improving  Anasarca -bilateral upper extremity edema, minimal LE edema - suspect related to prolonged hospitalization, hypoalbuminemia -upper extremity US -> age indeterminate SVT of right cephalic vein  Elevated LFT's -mild, continue to monitor overall stable  Leukocytosis -Resolved on antibiotics  Anemia -Relatively stable, follow  Scheduled Meds: .  stroke: mapping our early stages of recovery book   Does not apply Once  . atorvastatin  40 mg Per Tube Daily  . chlorhexidine gluconate (MEDLINE KIT)  15 mL Mouth Rinse BID  . Chlorhexidine Gluconate Cloth  6 each Topical Daily  . enoxaparin (LOVENOX) injection  40 mg Subcutaneous Q24H  . erythromycin  1 application Left Eye K4Y  . feeding supplement (PROSource TF)  45 mL Per Tube Daily  . free water  100 mL Per Tube Q4H  . insulin aspart  0-9 Units Subcutaneous Q4H  . mouth rinse  15 mL Mouth Rinse 10 times per day  . metoprolol tartrate  25 mg Per Tube BID  . pantoprazole sodium  40 mg Per Tube BID  . scopolamine  1 patch Transdermal Q72H   Continuous Infusions: . sodium chloride 100 mL/hr at 09/10/20 1024  . ceFEPime (MAXIPIME) IV 2 g (09/18/20 0615)  . feeding supplement (OSMOLITE 1.2 CAL) 1,000 mL (09/17/20 1546)   PRN Meds:.sodium chloride, acetaminophen **OR** acetaminophen (TYLENOL) oral liquid 160 mg/5 mL **OR** acetaminophen, hydrALAZINE, labetalol, oxyCODONE, polyethylene glycol, senna-docusate  Diet Orders (From admission, onward)    Start     Ordered   09/10/20 0001  Diet NPO time specified  Diet effective midnight        09/09/20 1421          DVT prophylaxis: enoxaparin (LOVENOX) injection 40 mg Start: 09/01/20 1600 SCD's Start: 08/27/20 0026     Code Status: Full Code  Family  Communication: Discussed with mother at bedside 3/3  Status is: Inpatient  Remains inpatient appropriate because:Inpatient level of care appropriate due to severity of illness   Dispo: The patient is from: Home              Anticipated d/c is to: SNF              Patient currently is not medically stable to d/c.   Difficult to place patient No   Level of care: Progressive  Consultants:  Palliative care Pulmonary  Neurology   Procedures:  Trach, PEG  Microbiology  None   Antimicrobials: Vancomycin / Cefeipme     Objective: Vitals:   09/18/20 0310 09/18/20 0406 09/18/20 0716 09/18/20 0800  BP: 105/63  111/65   Pulse: (!) 114 (!) 111 (!) 109   Resp: (!) 27 (!) 28 (!) 25   Temp: (!) 100.7 F (38.2 C)  98.2 F (36.8 C)   TempSrc: Oral  Axillary   SpO2: 100% 100% 100% 100%  Weight:      Height:        Intake/Output Summary (Last 24 hours) at 09/18/2020 0846 Last data filed at 09/17/2020 2355 Gross per 24 hour  Intake 529.89 ml  Output 400 ml  Net 129.89 ml   Filed Weights   09/15/20 0405 09/16/20 0500 09/17/20 0500  Weight: 70.8 kg 70.9 kg 70.8 kg    Examination:  Constitutional: Remains unresponsive Eyes: No icterus ENMT: Moist mucous membranes Neck: normal, supple Respiratory: Shallow respirations, diminished at the bases, no  wheezing heard Cardiovascular: Regular rate and rhythm, no murmurs, trace edema, mild anasarca throughout Abdomen: Soft, nontender, nondistended, bowel sounds positive Musculoskeletal: no clubbing / cyanosis.  Skin: No rashes seen Neurologic: Does not follow commands, withdraws to pain   Data Reviewed: I have independently reviewed following labs and imaging studies   CBC: Recent Labs  Lab 09/14/20 0357 09/15/20 0856 09/16/20 0353 09/17/20 0330 09/18/20 0414  WBC 9.4 10.7* 10.1 9.8 9.9  NEUTROABS  --  8.0* 7.8*  --   --   HGB 9.7* 9.0* 9.0* 8.3* 8.4*  HCT 29.3* 28.3* 27.7* 25.7* 27.1*  MCV 79.0* 80.6 80.1 80.6 81.9   PLT 479* 481* 467* 411* 295*   Basic Metabolic Panel: Recent Labs  Lab 09/12/20 0037 09/14/20 0357 09/15/20 0856 09/16/20 0353 09/17/20 0330 09/18/20 0414  NA 140 140 140 141 140 138  K 3.8 4.1 4.3 4.4 4.0 4.1  CL 103 104 104 106 106 105  CO2 '28 24 25 24 23 23  ' GLUCOSE 162* 135* 137* 132* 135* 142*  BUN 25* '15 19 19 18 20  ' CREATININE 0.79 0.68 0.90 0.86 0.82 0.83  CALCIUM 8.2* 8.7* 8.7* 8.9 8.5* 8.6*  MG 2.1  --  2.0 2.1 2.1 2.0  PHOS  --   --  4.4 4.5  --  4.0   Liver Function Tests: Recent Labs  Lab 09/15/20 0856 09/16/20 0353 09/17/20 0330 09/18/20 0414  AST 58* 66* 50* 40  ALT 57* 62* 55* 47*  ALKPHOS 113 108 101 90  BILITOT 0.3 0.5 0.4 0.4  PROT 6.2* 6.4* 5.9* 6.2*  ALBUMIN 1.8* 1.9* 1.7* 1.8*   Coagulation Profile: No results for input(s): INR, PROTIME in the last 168 hours. HbA1C: No results for input(s): HGBA1C in the last 72 hours. CBG: Recent Labs  Lab 09/17/20 1551 09/17/20 2017 09/17/20 2352 09/18/20 0314 09/18/20 0734  GLUCAP 122* 163* 135* 136* 119*    Recent Results (from the past 240 hour(s))  Culture, blood (routine x 2)     Status: None   Collection Time: 09/11/20  2:15 PM   Specimen: BLOOD LEFT HAND  Result Value Ref Range Status   Specimen Description BLOOD LEFT HAND  Final   Special Requests   Final    BOTTLES DRAWN AEROBIC AND ANAEROBIC Blood Culture adequate volume   Culture   Final    NO GROWTH 5 DAYS Performed at [facility], [place] [address] [place], [place] [address]    Report Status 09/16/2020 FINAL  Final  Culture, blood (routine x 2)     Status: None   Collection Time: 09/11/20  2:25 PM   Specimen: BLOOD LEFT FOREARM  Result Value Ref Range Status   Specimen Description BLOOD LEFT FOREARM  Final   Special Requests   Final    BOTTLES DRAWN AEROBIC AND ANAEROBIC Blood Culture adequate volume   Culture   Final    NO GROWTH 5 DAYS Performed at [facility], [place] [address], [place], [place] [address]     Report Status 09/16/2020 FINAL  Final  Culture, Respiratory w Gram Stain     Status: None   Collection Time: 09/11/20  2:39 PM   Specimen: Tracheal Aspirate; Respiratory  Result Value Ref Range Status   Specimen Description TRACHEAL ASPIRATE  Final   Special Requests Normal  Final   Gram Stain   Final    ABUNDANT WBC PRESENT,BOTH PMN AND MONONUCLEAR MODERATE GRAM NEGATIVE RODS RARE GRAM VARIABLE ROD  Performed at [facility], [place] [address] [place], [place] [address]    Culture ABUNDANT SERRATIA MARCESCENS  Final   Report Status 09/13/2020 FINAL  Final   Organism ID, Bacteria SERRATIA MARCESCENS  Final      Susceptibility   Serratia marcescens - MIC*    CEFAZOLIN >=64 RESISTANT Resistant     CEFEPIME <=0.12 SENSITIVE Sensitive     CEFTAZIDIME <=1 SENSITIVE Sensitive     CEFTRIAXONE <=0.25 SENSITIVE Sensitive     CIPROFLOXACIN <=0.25 SENSITIVE Sensitive     GENTAMICIN <=1 SENSITIVE Sensitive     TRIMETH/SULFA <=20 SENSITIVE Sensitive     * ABUNDANT SERRATIA MARCESCENS  Culture, blood (routine x 2)     Status: None (Preliminary result)   Collection Time: 09/15/20  8:57 AM   Specimen: BLOOD  Result Value Ref Range Status   Specimen Description BLOOD BLOOD LEFT FOREARM  Final   Special Requests   Final    BOTTLES DRAWN AEROBIC AND ANAEROBIC Blood Culture adequate volume   Culture   Final    NO GROWTH 3 DAYS Performed at [facility], [address], [place], [place] [address]    Report Status PENDING  Incomplete  Culture, blood (routine x 2)     Status: None (Preliminary result)   Collection Time: 09/15/20  9:03 AM   Specimen: BLOOD LEFT HAND  Result Value Ref Range Status   Specimen Description BLOOD LEFT HAND  Final   Special Requests   Final    BOTTLES DRAWN AEROBIC AND ANAEROBIC Blood Culture adequate volume   Culture   Final    NO GROWTH 3 DAYS Performed at [facility], [place] [address], [place], [place] [address]    Report Status PENDING   Incomplete  Culture, Respiratory w Gram Stain     Status: None   Collection Time: 09/15/20 11:28 AM   Specimen: Tracheal Aspirate; Respiratory  Result Value Ref Range Status   Specimen Description TRACHEAL ASPIRATE  Final   Special Requests NONE  Final   Gram Stain   Final    ABUNDANT WBC PRESENT,BOTH PMN AND MONONUCLEAR FEW SQUAMOUS EPITHELIAL CELLS PRESENT ABUNDANT GRAM NEGATIVE RODS FEW GRAM POSITIVE COCCI Performed at [facility], [place] [address], [place], [place] [address]    Culture FEW SERRATIA MARCESCENS  Final   Report Status 09/17/2020 FINAL  Final   Organism ID, Bacteria SERRATIA MARCESCENS  Final      Susceptibility   Serratia marcescens - MIC*    CEFAZOLIN >=64 RESISTANT Resistant     CEFEPIME <=0.12 SENSITIVE Sensitive     CEFTAZIDIME <=1 SENSITIVE Sensitive     CEFTRIAXONE <=0.25 SENSITIVE Sensitive     CIPROFLOXACIN <=0.25 SENSITIVE Sensitive     GENTAMICIN <=1 SENSITIVE Sensitive     TRIMETH/SULFA <=20 SENSITIVE Sensitive     * FEW SERRATIA MARCESCENS     Radiology Studies: No results found. Marzetta Board, MD, PhD [facility]  Between 7 am - 7 pm I am available, please contact me via Amion or Securechat  Between 7 pm - 7 am I am not available, please contact night coverage MD/APP via Amion

## 2020-09-18 NOTE — Progress Notes (Signed)
Pharmacy Antibiotic Note  Charlene [name] is a 55 y.o. female admitted on 08/26/2020 with pneumonia.  Pharmacy has been consulted for cefepime dosing.  Pt has been here since 2/9 for a hemorrhagic stroke. She was transferred out from the ICU. She has been on cefepime for her serratia PNA until 3/5.  Scr 0.83 WBC 9.9  Plan: Cefepime 2g IV q8  Height: 5\' 5"  (165.1 cm) Weight: 70.8 kg (156 lb 1.7 oz) IBW/kg (Calculated) : 57  Temp (24hrs), Avg:99.5 F (37.5 C), Min:98.2 F (36.8 C), Max:100.7 F (38.2 C)  Recent Labs  Lab 09/11/20 1412 09/12/20 0037 09/14/20 0357 09/15/20 0856 09/16/20 0353 09/17/20 0330 09/18/20 0414  WBC  --    < > 9.4 10.7* 10.1 9.8 9.9  CREATININE  --    < > 0.68 0.90 0.86 0.82 0.83  LATICACIDVEN 1.2  --   --   --   --   --   --    < > = values in this interval not displayed.    Estimated Creatinine Clearance: 77.3 mL/min (by C-G formula based on SCr of 0.83 mg/dL).    No Known Allergies  Antimicrobials this admission: Vanc 2/12 >>2/15, x1 2/20 3/1>>3/3 Cefepime 2/12 >>2/15; restart 2/27 >> (3/5) Cefazolin 2/19 >> 2/27  Dose adjustments this admission:   Microbiology results: 2/10 MRSA PCR - negative 2/12 BCx - negative 2/19 BCx - Staph epi and hominis, likely a contaminant 2/25 RCx - abundant serratia (pan-S, except R-cefazolin)  Charlene [name] A. 3/25, PharmD, BCPS, FNKF Clinical Pharmacist [place] Please utilize Amion for appropriate phone number to reach the unit pharmacist [facility])  09/18/2020 8:37 AM

## 2020-09-19 LAB — GLUCOSE, CAPILLARY
Glucose-Capillary: 106 mg/dL — ABNORMAL HIGH (ref 70–99)
Glucose-Capillary: 114 mg/dL — ABNORMAL HIGH (ref 70–99)
Glucose-Capillary: 127 mg/dL — ABNORMAL HIGH (ref 70–99)
Glucose-Capillary: 129 mg/dL — ABNORMAL HIGH (ref 70–99)
Glucose-Capillary: 134 mg/dL — ABNORMAL HIGH (ref 70–99)
Glucose-Capillary: 139 mg/dL — ABNORMAL HIGH (ref 70–99)
Glucose-Capillary: 145 mg/dL — ABNORMAL HIGH (ref 70–99)

## 2020-09-19 NOTE — Progress Notes (Signed)
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  Subsequently on 2/10, she developed obtundation and respiratory arrest leading to intubation.  She has remained persistently obtunded, now has a trach and a PEG.  Hospital course complicated by persistent fever and Serratia pneumonia  2/09 Admit for ICH 2/10 EVD placed, later intubated due to obtundation and respiratory arrest 2/11 MRI Brain, hypertonic saline d/c'd, EVD not working. Husband did not consent to PICC  2/13 EVD removed 2/17 Mena discussion with family, PMT 2/24 Bedside trach and PEG 3/1 Transferred to [place]  Subjective / 24h Interval events: Remains unresponsive  Assessment & Plan: Principal Problem Multiple acute strokes  Right Thalamus ICH with IVH  Obstructive Hydrocephalus Acute Metabolic Encephalopathy - CT 2/9 with acute hemorrhage with epicenter in the right thalamus.  Intraventricular penetration with blood filling the 3rd and forth ventricles and nearly filling the right lateral ventricle. MRI 2/11 with intraparenchymal hemorrhage centered within the right thalamus and basal ganglia extending into the right midbrain with intraventricular extension.  Effacement of prepontine cistern and similar 4 mm of leftward midline shift.  Separate areas of ristricted diffusion and edema involving the right greater than left mesial temporal lobes/parahippocampal cortex, bilateral basal ganglia, and possibly the upper pons that are concerning for acute infarcts, most likely 2/2 mass effect from the hemorrhage (see report)/ Per neuro "scattered b/l hippocampus, posterior CC, bilateral thalamus, and upper pons, likely due to deep penetrating vessel compression by focal mass-effect".  2/19 MRI  with acute to early subacute infarcts in the bilateral cerebral white matter and right cerebellum, new from 2/11 - interval evolution of multiple other infarcts present on prior MRI, increased hemorrhage along old right EVD tract (see report) - 2/21 head CT with expected evolution of ICH since 2/13 - echo with normal EF, no intracardiac source of embolism (see report) - CTA head/neck without intracranial arterial occlusion or high grade stenosis, no dissection, aneurysm, or hemodynamically significant stenosis of the carotid or vertebral arteries - per neurology - see last follow up note from 2/25 - ICH likely 2/2 hypertension and nonadherence to meds - s/p trach/peg on 2/24 - no antithrombotic 2/2 ICH - for obstructive hydrocephalus -> s/p external ventricular catheter placement on 08/27/20 by neurosurgery - EVD clamped on 2/12 and d/c'd on 2/13 - EEG suggestive of cortical dysfunction in right hemisphere likely 2/2 underlying bleed as well as severe diffuse encephalopathy, nonspecific etiology  - only withdraws to painful stimuli - will need to continue goals of care/palliative  - seizure precautions, aspiration precautions, nutrition, bowel regimen -Remains unresponsive  Active Problems Hypertensive emergency -Home medications include Coreg and HCTZ -currently on metoprolol  -hydral and labetalol prn -SBP goal remains < 160  Fever  Serratia Pneumonia  Hospital Acquired Pneumonia:  -Tracheal aspirate from 2/25 with serratia, currently back on antibiotics with vancomycin and cefepime due to persistent fever.  Repeat respiratory cultures with Serratia, discontinue vancomycin today. -There is also concern for central fever, started on buspirone -CXR with bilateral lower lobe opacities (L>R concerning for pneumonia).    Sputum cultures growing again Serratia, sensitive to cefepime.  Today is day 7 of antibiotics, will stop after today's doses and closely monitor  Acute hypoxemic respiratory  failure secondary to CVA -S/P Trach -continue on trach  collar -pulm continuing to follow for trach -Currently on 5 L  Dysphagia -s/p peg on tube feeds, seems to continue to tolerate well  Possible bacterial sinusitis -on broad spectrum abx  Bacterial Conjunctivitis -left eye, significant upper eyelid swelling as well -continue erythromycin ointment -continue to monitor, improving  Anasarca -bilateral upper extremity edema, minimal LE edema - suspect related to prolonged hospitalization, hypoalbuminemia -upper extremity US -> age indeterminate SVT of right cephalic vein  Elevated LFT's -mild, continue to monitor overall stable  Leukocytosis -Resolved on antibiotics  Anemia -Relatively stable, follow  Scheduled Meds: .  stroke: mapping our early stages of recovery book   Does not apply Once  . atorvastatin  40 mg Per Tube Daily  . chlorhexidine gluconate (MEDLINE KIT)  15 mL Mouth Rinse BID  . Chlorhexidine Gluconate Cloth  6 each Topical Daily  . enoxaparin (LOVENOX) injection  40 mg Subcutaneous Q24H  . erythromycin  1 application Left Eye S1U  . feeding supplement (PROSource TF)  45 mL Per Tube Daily  . free water  100 mL Per Tube Q4H  . insulin aspart  0-9 Units Subcutaneous Q4H  . mouth rinse  15 mL Mouth Rinse 10 times per day  . metoprolol tartrate  25 mg Per Tube BID  . pantoprazole sodium  40 mg Per Tube BID  . scopolamine  1 patch Transdermal Q72H   Continuous Infusions: . sodium chloride 100 mL/hr at 09/10/20 1024  . ceFEPime (MAXIPIME) IV 2 g (09/19/20 8372)  . feeding supplement (OSMOLITE 1.2 CAL) 1,000 mL (09/18/20 1334)   PRN Meds:.sodium chloride, acetaminophen **OR** acetaminophen (TYLENOL) oral liquid 160 mg/5 mL **OR** acetaminophen, hydrALAZINE, labetalol, oxyCODONE, polyethylene glycol, senna-docusate  Diet Orders (From admission, onward)    Start     Ordered   09/10/20 0001  Diet NPO time specified  Diet effective midnight        09/09/20  1421          DVT prophylaxis: enoxaparin (LOVENOX) injection 40 mg Start: 09/01/20 1600 SCD's Start: 08/27/20 0026     Code Status: Full Code  Family Communication: No family at bedside this morning  Status is: Inpatient  Remains inpatient appropriate because:Inpatient level of care appropriate due to severity of illness   Dispo: The patient is from: Home              Anticipated d/c is to: SNF              Patient currently is not medically stable to d/c.   Difficult to place patient No   Level of care: Progressive  Consultants:  Palliative care Pulmonary  Neurology   Procedures:  Lurline Idol, PEG  Microbiology  None   Antimicrobials: Vancomycin / Cefeipme     Objective: Vitals:   09/18/20 2325 09/19/20 0345 09/19/20 0600 09/19/20 0700  BP: (!) 145/101 (!) 137/97  (!) 139/97  Pulse: 93 100  (!) 117  Resp: (!) '22 16  20  ' Temp: 98.9 F (37.2 C) 98.9 F (37.2 C) 100.1 F (37.8 C) 99.5 F (37.5 C)  TempSrc: Oral Oral Axillary Axillary  SpO2: 100% 100%  100%  Weight:      Height:        Intake/Output Summary (Last 24 hours) at 09/19/2020 0850 Last data filed at 09/18/2020 2100 Gross per 24 hour  Intake 250 ml  Output 1500 ml  Net -1250 ml   Filed Weights   09/15/20 0405 09/16/20 0500 09/17/20 0500  Weight: 70.8  kg 70.9 kg 70.8 kg    Examination:  Constitutional: Unresponsive Eyes: No icterus ENMT: mmm Neck: normal, supple Respiratory: Shallow respirations, tachypneic, no wheezing Cardiovascular: Regular rate and rhythm, no murmurs, tachycardic, mild anasarca throughout Abdomen: Soft, NT, ND, bowel sounds positive Musculoskeletal: no clubbing / cyanosis.  Skin: No rashes seen Neurologic: Does not follow commands, obtunded   Data Reviewed: I have independently reviewed following labs and imaging studies   CBC: Recent Labs  Lab 09/14/20 0357 09/15/20 0856 09/16/20 0353 09/17/20 0330 09/18/20 0414  WBC 9.4 10.7* 10.1 9.8 9.9  NEUTROABS   --  8.0* 7.8*  --   --   HGB 9.7* 9.0* 9.0* 8.3* 8.4*  HCT 29.3* 28.3* 27.7* 25.7* 27.1*  MCV 79.0* 80.6 80.1 80.6 81.9  PLT 479* 481* 467* 411* 865*   Basic Metabolic Panel: Recent Labs  Lab 09/14/20 0357 09/15/20 0856 09/16/20 0353 09/17/20 0330 09/18/20 0414  NA 140 140 141 140 138  K 4.1 4.3 4.4 4.0 4.1  CL 104 104 106 106 105  CO2 '24 25 24 23 23  ' GLUCOSE 135* 137* 132* 135* 142*  BUN '15 19 19 18 20  ' CREATININE 0.68 0.90 0.86 0.82 0.83  CALCIUM 8.7* 8.7* 8.9 8.5* 8.6*  MG  --  2.0 2.1 2.1 2.0  PHOS  --  4.4 4.5  --  4.0   Liver Function Tests: Recent Labs  Lab 09/15/20 0856 09/16/20 0353 09/17/20 0330 09/18/20 0414  AST 58* 66* 50* 40  ALT 57* 62* 55* 47*  ALKPHOS 113 108 101 90  BILITOT 0.3 0.5 0.4 0.4  PROT 6.2* 6.4* 5.9* 6.2*  ALBUMIN 1.8* 1.9* 1.7* 1.8*   Coagulation Profile: No results for input(s): INR, PROTIME in the last 168 hours. HbA1C: No results for input(s): HGBA1C in the last 72 hours. CBG: Recent Labs  Lab 09/18/20 1540 09/18/20 2019 09/18/20 2325 09/19/20 0344 09/19/20 0716  GLUCAP 100* 120* 141* 129* 114*    Recent Results (from the past 240 hour(s))  Culture, blood (routine x 2)     Status: None   Collection Time: 09/11/20  2:15 PM   Specimen: BLOOD LEFT HAND  Result Value Ref Range Status   Specimen Description BLOOD LEFT HAND  Final   Special Requests   Final    BOTTLES DRAWN AEROBIC AND ANAEROBIC Blood Culture adequate volume   Culture   Final    NO GROWTH 5 DAYS Performed at [facility], [place] [address] [place], [place] [address]    Report Status 09/16/2020 FINAL  Final  Culture, blood (routine x 2)     Status: None   Collection Time: 09/11/20  2:25 PM   Specimen: BLOOD LEFT FOREARM  Result Value Ref Range Status   Specimen Description BLOOD LEFT FOREARM  Final   Special Requests   Final    BOTTLES DRAWN AEROBIC AND ANAEROBIC Blood Culture adequate volume   Culture   Final    NO GROWTH 5 DAYS Performed at  [facility], [place] [address] [place], [place] [address]    Report Status 09/16/2020 FINAL  Final  Culture, Respiratory w Gram Stain     Status: None   Collection Time: 09/11/20  2:39 PM   Specimen: Tracheal Aspirate; Respiratory  Result Value Ref Range Status   Specimen Description TRACHEAL ASPIRATE  Final   Special Requests Normal  Final   Gram Stain   Final    ABUNDANT WBC PRESENT,BOTH PMN AND MONONUCLEAR MODERATE  GRAM NEGATIVE RODS RARE GRAM VARIABLE ROD Performed at [facility], [place] [address] [place], [place] [address]    Culture ABUNDANT SERRATIA MARCESCENS  Final   Report Status 09/13/2020 FINAL  Final   Organism ID, Bacteria SERRATIA MARCESCENS  Final      Susceptibility   Serratia marcescens - MIC*    CEFAZOLIN >=64 RESISTANT Resistant     CEFEPIME <=0.12 SENSITIVE Sensitive     CEFTAZIDIME <=1 SENSITIVE Sensitive     CEFTRIAXONE <=0.25 SENSITIVE Sensitive     CIPROFLOXACIN <=0.25 SENSITIVE Sensitive     GENTAMICIN <=1 SENSITIVE Sensitive     TRIMETH/SULFA <=20 SENSITIVE Sensitive     * ABUNDANT SERRATIA MARCESCENS  Culture, blood (routine x 2)     Status: None (Preliminary result)   Collection Time: 09/15/20  8:57 AM   Specimen: BLOOD  Result Value Ref Range Status   Specimen Description BLOOD BLOOD LEFT FOREARM  Final   Special Requests   Final    BOTTLES DRAWN AEROBIC AND ANAEROBIC Blood Culture adequate volume   Culture   Final    NO GROWTH 3 DAYS Performed at [facility], [address] [place], [place] [address]    Report Status PENDING  Incomplete  Culture, blood (routine x 2)     Status: None (Preliminary result)   Collection Time: 09/15/20  9:03 AM   Specimen: BLOOD LEFT HAND  Result Value Ref Range Status   Specimen Description BLOOD LEFT HAND  Final   Special Requests   Final    BOTTLES DRAWN AEROBIC AND ANAEROBIC Blood Culture adequate volume   Culture   Final    NO GROWTH 3 DAYS Performed at [facility], [place]  [address], [place], [place] [address]    Report Status PENDING  Incomplete  Culture, Respiratory w Gram Stain     Status: None   Collection Time: 09/15/20 11:28 AM   Specimen: Tracheal Aspirate; Respiratory  Result Value Ref Range Status   Specimen Description TRACHEAL ASPIRATE  Final   Special Requests NONE  Final   Gram Stain   Final    ABUNDANT WBC PRESENT,BOTH PMN AND MONONUCLEAR FEW SQUAMOUS EPITHELIAL CELLS PRESENT ABUNDANT GRAM NEGATIVE RODS FEW GRAM POSITIVE COCCI Performed at [facility], [place] [address], [place], [place] [address]    Culture FEW SERRATIA MARCESCENS  Final   Report Status 09/17/2020 FINAL  Final   Organism ID, Bacteria SERRATIA MARCESCENS  Final      Susceptibility   Serratia marcescens - MIC*    CEFAZOLIN >=64 RESISTANT Resistant     CEFEPIME <=0.12 SENSITIVE Sensitive     CEFTAZIDIME <=1 SENSITIVE Sensitive     CEFTRIAXONE <=0.25 SENSITIVE Sensitive     CIPROFLOXACIN <=0.25 SENSITIVE Sensitive     GENTAMICIN <=1 SENSITIVE Sensitive     TRIMETH/SULFA <=20 SENSITIVE Sensitive     * FEW SERRATIA MARCESCENS     Radiology Studies: No results found. Marzetta Board, MD, PhD [facility]  Between 7 am - 7 pm I am available, please contact me via Amion or Securechat  Between 7 pm - 7 am I am not available, please contact night coverage MD/APP via Amion

## 2020-09-19 NOTE — Progress Notes (Signed)
MEWS change from yellow to red due to pulse and LOC, MD already aware, new orders to notify MD if patient becomes hypotensive.

## 2020-09-19 NOTE — Plan of Care (Signed)
Not progressing at this time due to altered mental status.   Problem: Education: Goal: Knowledge of disease or condition will improve Outcome: Not Progressing Goal: Knowledge of secondary prevention will improve Outcome: Not Progressing Goal: Knowledge of patient specific risk factors addressed and post discharge goals established will improve Outcome: Not Progressing Goal: Individualized Educational Video(s) Outcome: Not Progressing   Problem: Coping: Goal: Will verbalize positive feelings about self Outcome: Not Progressing Goal: Will identify appropriate support needs Outcome: Not Progressing   Problem: Health Behavior/Discharge Planning: Goal: Ability to manage health-related needs will improve Outcome: Not Progressing   Problem: Self-Care: Goal: Ability to participate in self-care as condition permits will improve Outcome: Not Progressing Goal: Verbalization of feelings and concerns over difficulty with self-care will improve Outcome: Not Progressing Goal: Ability to communicate needs accurately will improve Outcome: Not Progressing   Problem: Nutrition: Goal: Risk of aspiration will decrease Outcome: Not Progressing Goal: Dietary intake will improve Outcome: Not Progressing   Problem: Intracerebral Hemorrhage Tissue Perfusion: Goal: Complications of Intracerebral Hemorrhage will be minimized Outcome: Not Progressing   Problem: Education: Goal: Knowledge of General Education information will improve Description: Including pain rating scale, medication(s)/side effects and non-pharmacologic comfort measures Outcome: Not Progressing   Problem: Health Behavior/Discharge Planning: Goal: Ability to manage health-related needs will improve Outcome: Not Progressing   Problem: Clinical Measurements: Goal: Ability to maintain clinical measurements within normal limits will improve Outcome: Not Progressing Goal: Will remain free from infection Outcome: Not  Progressing Goal: Diagnostic test results will improve Outcome: Not Progressing Goal: Respiratory complications will improve Outcome: Not Progressing Goal: Cardiovascular complication will be avoided Outcome: Not Progressing   Problem: Activity: Goal: Risk for activity intolerance will decrease Outcome: Not Progressing

## 2020-09-19 NOTE — Progress Notes (Signed)
PRN meds given to address elevated temperature and BP.

## 2020-09-20 LAB — GLUCOSE, CAPILLARY
Glucose-Capillary: 115 mg/dL — ABNORMAL HIGH (ref 70–99)
Glucose-Capillary: 110 mg/dL — ABNORMAL HIGH (ref 70–99)
Glucose-Capillary: 101 mg/dL — ABNORMAL HIGH (ref 70–99)
Glucose-Capillary: 120 mg/dL — ABNORMAL HIGH (ref 70–99)
Glucose-Capillary: 128 mg/dL — ABNORMAL HIGH (ref 70–99)
Glucose-Capillary: 124 mg/dL — ABNORMAL HIGH (ref 70–99)

## 2020-09-20 LAB — BASIC METABOLIC PANEL
Anion gap: 12 (ref 5–15)
BUN: 21 mg/dL — ABNORMAL HIGH (ref 6–20)
CO2: 27 mmol/L (ref 22–32)
Calcium: 9.1 mg/dL (ref 8.9–10.3)
Chloride: 101 mmol/L (ref 98–111)
Creatinine, Ser: 0.81 mg/dL (ref 0.44–1.00)
GFR, Estimated: >60 mL/min (ref >60)
Glucose, Bld: 118 mg/dL — ABNORMAL HIGH (ref 70–99)
Potassium: 3.9 mmol/L (ref 3.5–5.1)
Sodium: 140 mmol/L (ref 135–145)

## 2020-09-20 LAB — CBC
HCT: 28.5 % — ABNORMAL LOW (ref 36.0–46.0)
Hemoglobin: 8.8 g/dL — ABNORMAL LOW (ref 12.0–15.0)
MCH: 25.2 pg — ABNORMAL LOW (ref 26.0–34.0)
MCHC: 30.9 g/dL (ref 30.0–36.0)
MCV: 81.7 fL (ref 80.0–100.0)
Platelets: 433 10*3/uL — ABNORMAL HIGH (ref 150–400)
RBC: 3.49 MIL/uL — ABNORMAL LOW (ref 3.87–5.11)
RDW: 17.2 % — ABNORMAL HIGH (ref 11.5–15.5)
WBC: 10 10*3/uL (ref 4.0–10.5)
nRBC: 0 % (ref 0.0–0.2)

## 2020-09-20 LAB — CULTURE, BLOOD (ROUTINE X 2)
Culture: NO GROWTH
Culture: NO GROWTH
Special Requests: ADEQUATE
Special Requests: ADEQUATE

## 2020-09-20 NOTE — Progress Notes (Signed)
PROGRESS NOTE  Charlene [name] OFB:[id number] DOB: October 26, 1965 DOA: 08/26/2020 PCP: Patient, [name]   LOS: 24 days   Brief Narrative / Interim history: 55 yo F with 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  Subsequently on 2/10, she developed obtundation and respiratory arrest leading to intubation.  She has remained persistently obtunded, now has a trach and a PEG.  Hospital course complicated by persistent fever and Serratia pneumonia  2/09 Admit for ICH 2/10 EVD placed, later intubated due to obtundation and respiratory arrest 2/11 MRI Brain, hypertonic saline d/c'd, EVD not working. Husband did not consent to PICC  2/13 EVD removed 2/17 [place] discussion with family, PMT 2/24 Bedside trach and PEG 3/1 Transferred to [place]  Subjective / 24h Interval events: Obtunded  Assessment & Plan: Principal Problem Multiple acute strokes  Right Thalamus ICH with IVH  Obstructive Hydrocephalus Acute Metabolic Encephalopathy - CT 2/9 with acute hemorrhage with epicenter in the right thalamus.  Intraventricular penetration with blood filling the 3rd and forth ventricles and nearly filling the right lateral ventricle. MRI 2/11 with intraparenchymal hemorrhage centered within the right thalamus and basal ganglia extending into the right midbrain with intraventricular extension.  Effacement of prepontine cistern and similar 4 mm of leftward midline shift.  Separate areas of ristricted diffusion and edema involving the right greater than left mesial temporal lobes/parahippocampal cortex, bilateral basal ganglia, and possibly the upper pons that are concerning for acute infarcts, most likely 2/2 mass effect from the hemorrhage (see report)/ Per neuro "scattered b/l hippocampus, posterior CC, bilateral thalamus, and upper pons, likely due to deep penetrating vessel compression by focal mass-effect".  2/19 MRI with acute to  early subacute infarcts in the bilateral cerebral white matter and right cerebellum, new from 2/11 - interval evolution of multiple other infarcts present on prior MRI, increased hemorrhage along old right EVD tract (see report) - 2/21 head CT with expected evolution of ICH since 2/13 - echo with normal EF, no intracardiac source of embolism (see report) - CTA head/neck without intracranial arterial occlusion or high grade stenosis, no dissection, aneurysm, or hemodynamically significant stenosis of the carotid or vertebral arteries - per neurology - see last follow up note from 2/25 - ICH likely 2/2 hypertension and nonadherence to meds - s/p trach/peg on 2/24 - no antithrombotic 2/2 ICH - for obstructive hydrocephalus -> s/p external ventricular catheter placement on 08/27/20 by neurosurgery - EVD clamped on 2/12 and d/c'd on 2/13 - EEG suggestive of cortical dysfunction in right hemisphere likely 2/2 underlying bleed as well as severe diffuse encephalopathy, nonspecific etiology  -only withdraws to painful stimuli - will need to continue goals of care/palliative  -seizure precautions, aspiration precautions, nutrition, bowel regimen -Remains obtunded  Active Problems Hypertensive emergency -Home medications include Coreg and HCTZ -currently on metoprolol  -hydral and labetalol prn -SBP goal remains < 160  Fever  Serratia Pneumonia  Hospital Acquired Pneumonia:  -Tracheal aspirate from 2/25 with serratia -There is also concern for central fever, started on buspirone -CXR with bilateral lower lobe opacities (L>R concerning for pneumonia).    Sputum cultures growing again Serratia, sensitive to cefepime. Status post second round of cefepime, now monitor off antibiotics. Still has intermittent fevers  Acute hypoxemic respiratory failure secondary to CVA -S/P Trach -continue on trach collar -pulm continuing to follow for trach -Currently on 5 L  Dysphagia -s/p peg on tube feeds,  seems to continue to tolerate well  Possible bacterial sinusitis -Off antibiotics now  Bacterial Conjunctivitis -left eye, still with persistent upper eyelid swelling. Concern for deeper issue, will obtain MRI of the orbits which is pending this morning -continue erythromycin ointment -Continue to monitor  Anasarca -bilateral upper extremity edema, minimal LE edema - suspect related to prolonged hospitalization, hypoalbuminemia -upper extremity US -> age indeterminate SVT of right cephalic vein  Elevated LFT's -mild, continue to monitor overall stable  Leukocytosis -Resolved on antibiotics  Anemia -Stable, follow  Scheduled Meds: .  stroke: mapping our early stages of recovery book   Does not apply Once  . atorvastatin  40 mg Per Tube Daily  . chlorhexidine gluconate (MEDLINE KIT)  15 mL Mouth Rinse BID  . Chlorhexidine Gluconate Cloth  6 each Topical Daily  . enoxaparin (LOVENOX) injection  40 mg Subcutaneous Q24H  . erythromycin  1 application Left Eye N1Z  . feeding supplement (PROSource TF)  45 mL Per Tube Daily  . free water  100 mL Per Tube Q4H  . insulin aspart  0-9 Units Subcutaneous Q4H  . mouth rinse  15 mL Mouth Rinse 10 times per day  . metoprolol tartrate  25 mg Per Tube BID  . pantoprazole sodium  40 mg Per Tube BID  . scopolamine  1 patch Transdermal Q72H   Continuous Infusions: . sodium chloride 100 mL/hr at 09/10/20 1024  . feeding supplement (OSMOLITE 1.2 CAL) 1,000 mL (09/18/20 1334)   PRN Meds:.sodium chloride, acetaminophen **OR** acetaminophen (TYLENOL) oral liquid 160 mg/5 mL **OR** acetaminophen, hydrALAZINE, labetalol, oxyCODONE, polyethylene glycol, senna-docusate  Diet Orders (From admission, onward)    Start     Ordered   09/10/20 0001  Diet NPO time specified  Diet effective midnight        09/09/20 1421          DVT prophylaxis: enoxaparin (LOVENOX) injection 40 mg Start: 09/01/20 1600 SCD's Start: 08/27/20 0026     Code Status:  Full Code  Family Communication: Mother at bedside 3/5  Status is: Inpatient  Remains inpatient appropriate because:Inpatient level of care appropriate due to severity of illness   Dispo: The patient is from: Home              Anticipated d/c is to: SNF              Patient currently is not medically stable to d/c.   Difficult to place patient No   Level of care: Progressive  Consultants:  Palliative care Pulmonary  Neurology   Procedures:  Lurline Idol, PEG  Microbiology  None   Antimicrobials: None currently   Objective: Vitals:   09/20/20 0000 09/20/20 0306 09/20/20 0340 09/20/20 0625  BP: 139/90 (!) 154/117  124/73  Pulse:  (!) 113  (!) 102  Resp:  (!) 21  (!) 22  Temp: 99.2 F (37.3 C) 98.9 F (37.2 C)  98.7 F (37.1 C)  TempSrc: Axillary Axillary  Axillary  SpO2:  100% 100% 100%  Weight:      Height:        Intake/Output Summary (Last 24 hours) at 09/20/2020 0647 Last data filed at 09/20/2020 0017 Gross per 24 hour  Intake --  Output 800 ml  Net -800 ml   Filed Weights   09/15/20 0405 09/16/20 0500 09/17/20 0500  Weight: 70.8 kg 70.9 kg 70.8 kg    Examination:  Constitutional: Unresponsive Eyes: No icterus, left upper eyelid swelling ENMT: mmm Neck: normal, supple Respiratory: Shallow respirations, tachypnea, no wheezing Cardiovascular:  Regular rate and rhythm, no murmurs, tachycardic mild anasarca throughout Abdomen: Soft, nontender, nondistended, PEG tube in place Musculoskeletal: no clubbing / cyanosis.  Skin: No rashes seen Neurologic: Remains obtunded   Data Reviewed: I have independently reviewed following labs and imaging studies   CBC: Recent Labs  Lab 09/15/20 0856 09/16/20 0353 09/17/20 0330 09/18/20 0414 09/20/20 0227  WBC 10.7* 10.1 9.8 9.9 10.0  NEUTROABS 8.0* 7.8*  --   --   --   HGB 9.0* 9.0* 8.3* 8.4* 8.8*  HCT 28.3* 27.7* 25.7* 27.1* 28.5*  MCV 80.6 80.1 80.6 81.9 81.7  PLT 481* 467* 411* 431* 433*   Basic  Metabolic Panel: Recent Labs  Lab 09/15/20 0856 09/16/20 0353 09/17/20 0330 09/18/20 0414 09/20/20 0227  NA 140 141 140 138 140  K 4.3 4.4 4.0 4.1 3.9  CL 104 106 106 105 101  CO2 '25 24 23 23 27  ' GLUCOSE 137* 132* 135* 142* 118*  BUN '19 19 18 20 ' 21*  CREATININE 0.90 0.86 0.82 0.83 0.81  CALCIUM 8.7* 8.9 8.5* 8.6* 9.1  MG 2.0 2.1 2.1 2.0  --   PHOS 4.4 4.5  --  4.0  --    Liver Function Tests: Recent Labs  Lab 09/15/20 0856 09/16/20 0353 09/17/20 0330 09/18/20 0414  AST 58* 66* 50* 40  ALT 57* 62* 55* 47*  ALKPHOS 113 108 101 90  BILITOT 0.3 0.5 0.4 0.4  PROT 6.2* 6.4* 5.9* 6.2*  ALBUMIN 1.8* 1.9* 1.7* 1.8*   Coagulation Profile: No results for input(s): INR, PROTIME in the last 168 hours. HbA1C: No results for input(s): HGBA1C in the last 72 hours. CBG: Recent Labs  Lab 09/19/20 1523 09/19/20 1814 09/19/20 2110 09/19/20 2357 09/20/20 0309  GLUCAP 134* 145* 139* 127* 115*    Recent Results (from the past 240 hour(s))  Culture, blood (routine x 2)     Status: None   Collection Time: 09/11/20  2:15 PM   Specimen: BLOOD LEFT HAND  Result Value Ref Range Status   Specimen Description BLOOD LEFT HAND  Final   Special Requests   Final    BOTTLES DRAWN AEROBIC AND ANAEROBIC Blood Culture adequate volume   Culture   Final    NO GROWTH 5 DAYS Performed at [facility], [place] [address], [place], [place] [address]    Report Status 09/16/2020 FINAL  Final  Culture, blood (routine x 2)     Status: None   Collection Time: 09/11/20  2:25 PM   Specimen: BLOOD LEFT FOREARM  Result Value Ref Range Status   Specimen Description BLOOD LEFT FOREARM  Final   Special Requests   Final    BOTTLES DRAWN AEROBIC AND ANAEROBIC Blood Culture adequate volume   Culture   Final    NO GROWTH 5 DAYS Performed at [facility], [place] [address], [place], [place] [address]    Report Status 09/16/2020 FINAL  Final  Culture, Respiratory w Gram Stain     Status: None    Collection Time: 09/11/20  2:39 PM   Specimen: Tracheal Aspirate; Respiratory  Result Value Ref Range Status   Specimen Description TRACHEAL ASPIRATE  Final   Special Requests Normal  Final   Gram Stain   Final    ABUNDANT WBC PRESENT,BOTH PMN AND MONONUCLEAR MODERATE GRAM NEGATIVE RODS RARE GRAM VARIABLE ROD Performed at [facility], [place] [address] [place], [place] [address]    Culture [facility] MARCESCENS  Final  Report Status 09/13/2020 FINAL  Final   Organism ID, Bacteria SERRATIA MARCESCENS  Final      Susceptibility   Serratia marcescens - MIC*    CEFAZOLIN >=64 RESISTANT Resistant     CEFEPIME <=0.12 SENSITIVE Sensitive     CEFTAZIDIME <=1 SENSITIVE Sensitive     CEFTRIAXONE <=0.25 SENSITIVE Sensitive     CIPROFLOXACIN <=0.25 SENSITIVE Sensitive     GENTAMICIN <=1 SENSITIVE Sensitive     TRIMETH/SULFA <=20 SENSITIVE Sensitive     * ABUNDANT SERRATIA MARCESCENS  Culture, blood (routine x 2)     Status: None (Preliminary result)   Collection Time: 09/15/20  8:57 AM   Specimen: BLOOD  Result Value Ref Range Status   Specimen Description BLOOD BLOOD LEFT FOREARM  Final   Special Requests   Final    BOTTLES DRAWN AEROBIC AND ANAEROBIC Blood Culture adequate volume   Culture   Final    NO GROWTH 4 DAYS Performed at [facility], [address], [place], [place] [address]    Report Status PENDING  Incomplete  Culture, blood (routine x 2)     Status: None (Preliminary result)   Collection Time: 09/15/20  9:03 AM   Specimen: BLOOD LEFT HAND  Result Value Ref Range Status   Specimen Description BLOOD LEFT HAND  Final   Special Requests   Final    BOTTLES DRAWN AEROBIC AND ANAEROBIC Blood Culture adequate volume   Culture   Final    NO GROWTH 4 DAYS Performed at [facility], [place] [address] [place], [place] [address]    Report Status PENDING  Incomplete  Culture, Respiratory w Gram Stain     Status: None   Collection Time: 09/15/20 11:28 AM    Specimen: Tracheal Aspirate; Respiratory  Result Value Ref Range Status   Specimen Description TRACHEAL ASPIRATE  Final   Special Requests NONE  Final   Gram Stain   Final    ABUNDANT WBC PRESENT,BOTH PMN AND MONONUCLEAR FEW SQUAMOUS EPITHELIAL CELLS PRESENT ABUNDANT GRAM NEGATIVE RODS FEW GRAM POSITIVE COCCI Performed at [facility], [place] [address], [place],  [address]    Culture FEW SERRATIA MARCESCENS  Final   Report Status 09/17/2020 FINAL  Final   Organism ID, Bacteria SERRATIA MARCESCENS  Final      Susceptibility   Serratia marcescens - MIC*    CEFAZOLIN >=64 RESISTANT Resistant     CEFEPIME <=0.12 SENSITIVE Sensitive     CEFTAZIDIME <=1 SENSITIVE Sensitive     CEFTRIAXONE <=0.25 SENSITIVE Sensitive     CIPROFLOXACIN <=0.25 SENSITIVE Sensitive     GENTAMICIN <=1 SENSITIVE Sensitive     TRIMETH/SULFA <=20 SENSITIVE Sensitive     * FEW SERRATIA MARCESCENS     Radiology Studies: No results found. Marzetta Board, MD, PhD [facility]  Between 7 am - 7 pm I am available, please contact me via Amion or Securechat  Between 7 pm - 7 am I am not available, please contact night coverage MD/APP via Amion

## 2020-09-20 NOTE — Progress Notes (Signed)
PRN medication given

## 2020-09-20 NOTE — Plan of Care (Signed)
  Problem: Education: Goal: Knowledge of disease or condition will improve Outcome: Not Progressing Goal: Knowledge of secondary prevention will improve Outcome: Not Progressing Goal: Knowledge of patient specific risk factors addressed and post discharge goals established will improve Outcome: Not Progressing Goal: Individualized Educational Video(s) Outcome: Not Progressing   Problem: Coping: Goal: Will verbalize positive feelings about self Outcome: Not Progressing Goal: Will identify appropriate support needs Outcome: Not Progressing   Problem: Health Behavior/Discharge Planning: Goal: Ability to manage health-related needs will improve Outcome: Not Progressing   Problem: Self-Care: Goal: Ability to participate in self-care as condition permits will improve Outcome: Not Progressing Goal: Verbalization of feelings and concerns over difficulty with self-care will improve Outcome: Not Progressing Goal: Ability to communicate needs accurately will improve Outcome: Not Progressing   Problem: Nutrition: Goal: Risk of aspiration will decrease Outcome: Not Progressing Goal: Dietary intake will improve Outcome: Not Progressing   Problem: Intracerebral Hemorrhage Tissue Perfusion: Goal: Complications of Intracerebral Hemorrhage will be minimized Outcome: Not Progressing

## 2020-09-21 ENCOUNTER — Inpatient Hospital Stay (HOSPITAL_COMMUNITY): Payer: Medicaid Other

## 2020-09-21 LAB — CBC
HCT: 28.6 % — ABNORMAL LOW (ref 36.0–46.0)
Hemoglobin: 9.3 g/dL — ABNORMAL LOW (ref 12.0–15.0)
MCH: 26.1 pg (ref 26.0–34.0)
MCHC: 32.5 g/dL (ref 30.0–36.0)
MCV: 80.1 fL (ref 80.0–100.0)
Platelets: 458 10*3/uL — ABNORMAL HIGH (ref 150–400)
RBC: 3.57 MIL/uL — ABNORMAL LOW (ref 3.87–5.11)
RDW: 17.1 % — ABNORMAL HIGH (ref 11.5–15.5)
WBC: 9.2 10*3/uL (ref 4.0–10.5)
nRBC: 0 % (ref 0.0–0.2)

## 2020-09-21 LAB — BASIC METABOLIC PANEL
Anion gap: 11 (ref 5–15)
BUN: 21 mg/dL — ABNORMAL HIGH (ref 6–20)
CO2: 27 mmol/L (ref 22–32)
Calcium: 9.2 mg/dL (ref 8.9–10.3)
Chloride: 101 mmol/L (ref 98–111)
Creatinine, Ser: 0.86 mg/dL (ref 0.44–1.00)
GFR, Estimated: >60 mL/min (ref >60)
Glucose, Bld: 140 mg/dL — ABNORMAL HIGH (ref 70–99)
Potassium: 4 mmol/L (ref 3.5–5.1)
Sodium: 139 mmol/L (ref 135–145)

## 2020-09-21 LAB — GLUCOSE, CAPILLARY
Glucose-Capillary: 100 mg/dL — ABNORMAL HIGH (ref 70–99)
Glucose-Capillary: 113 mg/dL — ABNORMAL HIGH (ref 70–99)
Glucose-Capillary: 113 mg/dL — ABNORMAL HIGH (ref 70–99)
Glucose-Capillary: 120 mg/dL — ABNORMAL HIGH (ref 70–99)
Glucose-Capillary: 122 mg/dL — ABNORMAL HIGH (ref 70–99)
Glucose-Capillary: 134 mg/dL — ABNORMAL HIGH (ref 70–99)

## 2020-09-21 IMAGING — MR MR ORBITS WO/W CM
4 of 8 series · 19 of 48 positions shown · IV contrast (gadavist)
Comparison: None.

CLINICAL DATA: Orbital cellulitis

EXAM:
MRI OF THE ORBITS WITHOUT AND WITH CONTRAST
TECHNIQUE: Multiplanar, multisequence MR imaging of the orbits was performed
both before and after the administration of intravenous contrast.
CONTRAST:  7mL GADAVIST GADOBUTROL 1 MMOL/ML IV SOLN

[Series 4: FLAIR · oblique · 5.0mm · 0.47mm/px · 6 of 25 slices shown]
[im 1/25]
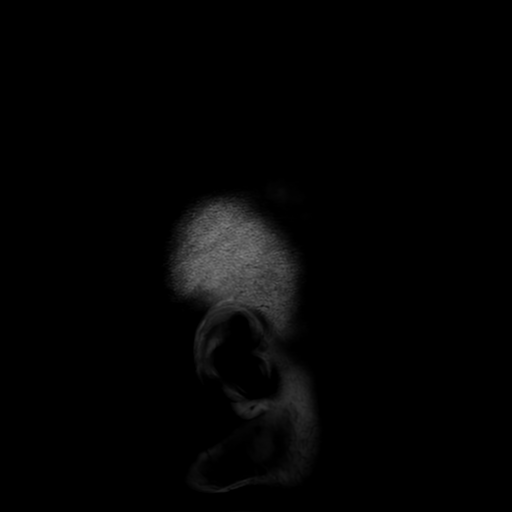
[im 5/25]
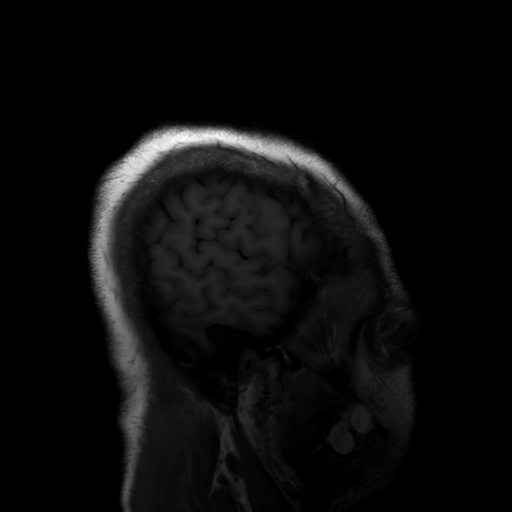
[im 10/25]
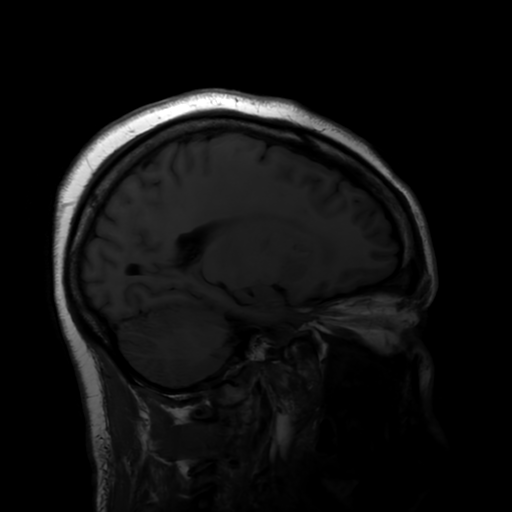
[im 15/25]
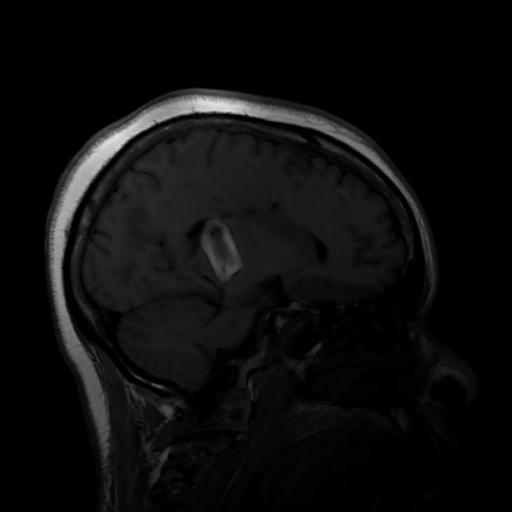
[im 20/25]
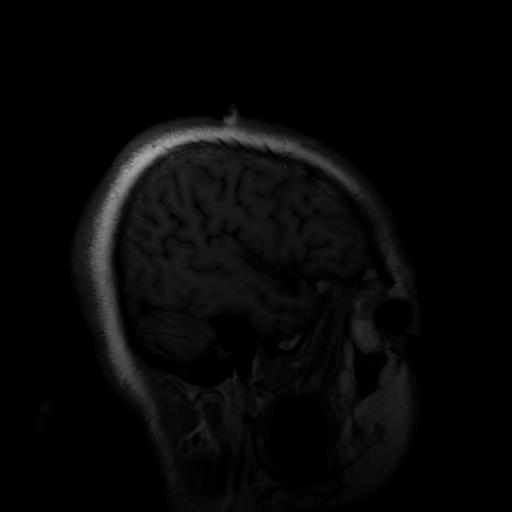
[im 25/25]
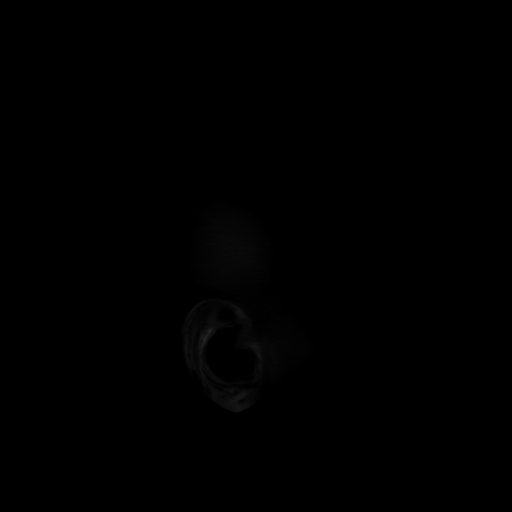

[Series 5: T2 · axial · 5.0mm · 0.47mm/px · z∈[+15,+152]mm · 6 of 26 slices shown]
[im 1/26]
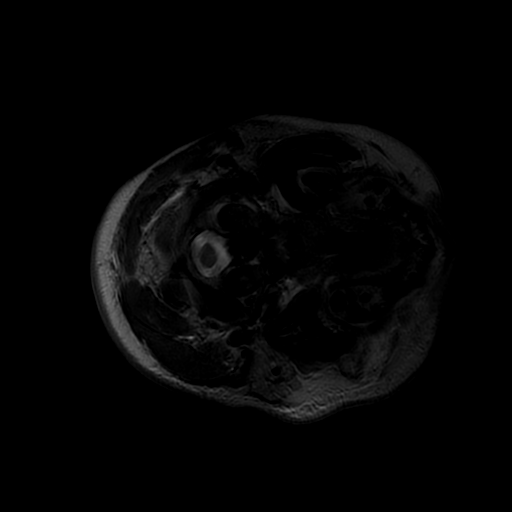
[im 6/26]
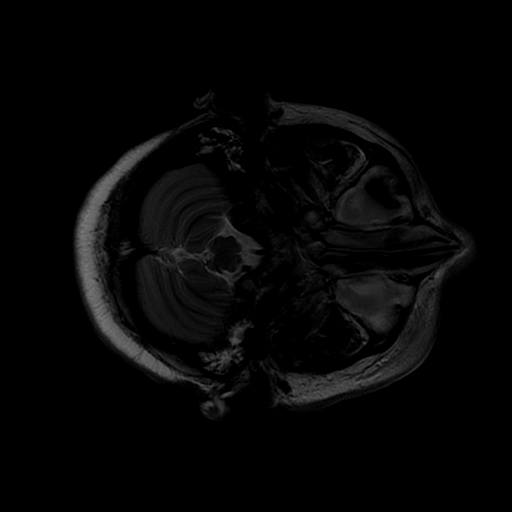
[im 11/26]
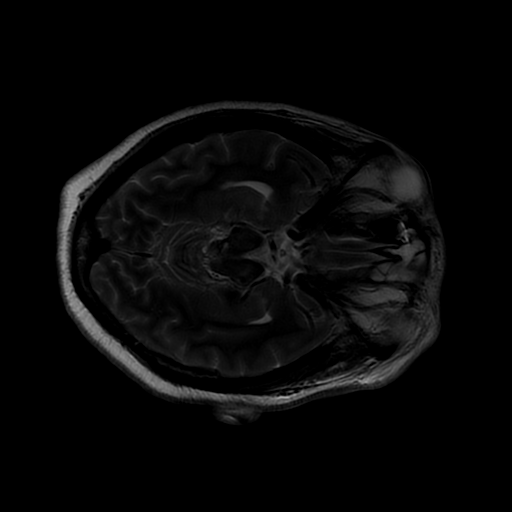
[im 16/26]
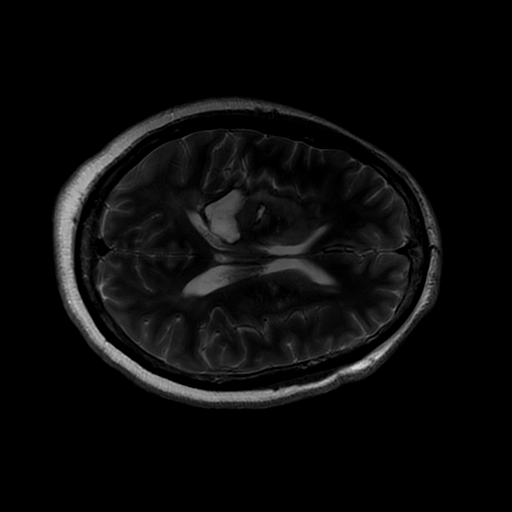
[im 21/26]
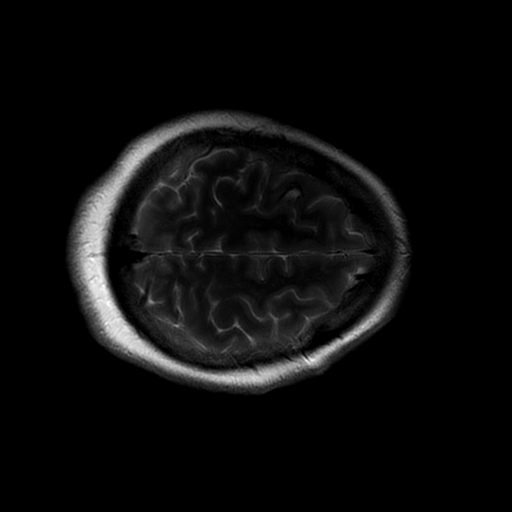
[im 26/26]
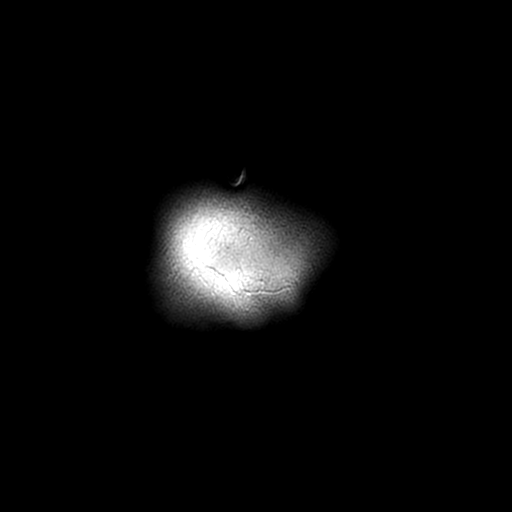

[Series 6: T2 fat-sat · oblique · 4.0mm · 0.35mm/px · 4 of 21 slices shown (1 of 2)]
[im 1/21]
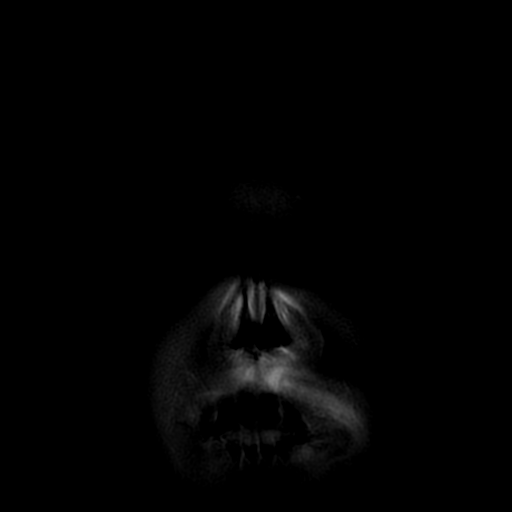
[im 5/21]
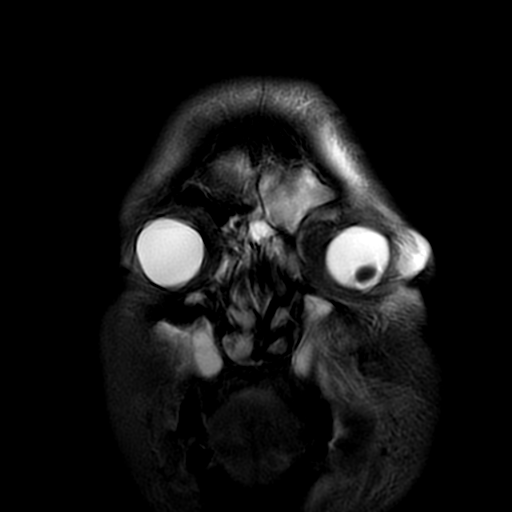
[im 13/21]
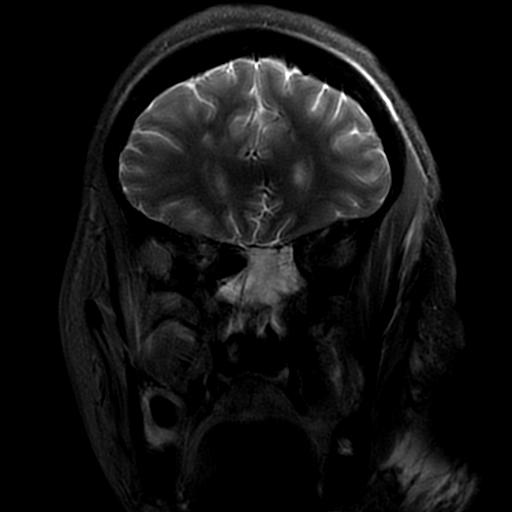
[im 21/21]
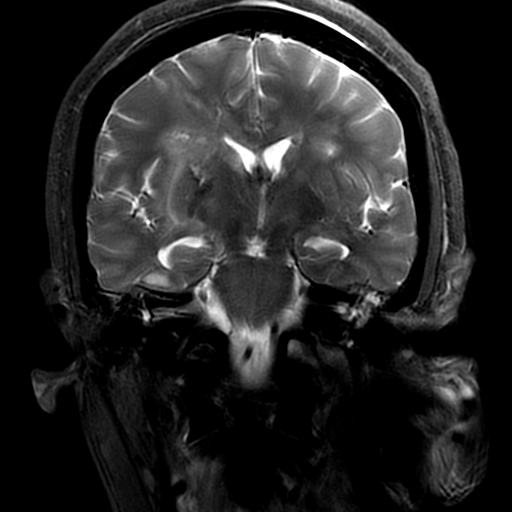

[Series 7: T2 fat-sat · axial · 3.0mm · 0.35mm/px · z∈[+38,+80]mm · 3 of 20 slices shown (2 of 2)]
[im 4/20]
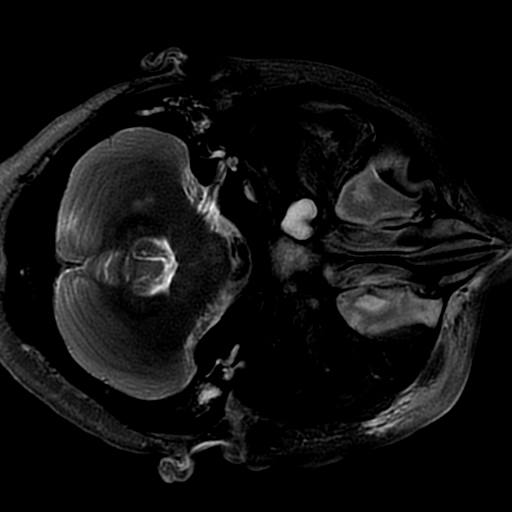
[im 12/20]
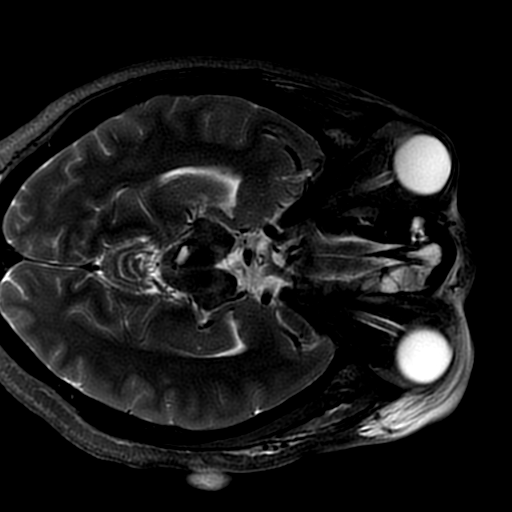
[im 20/20]
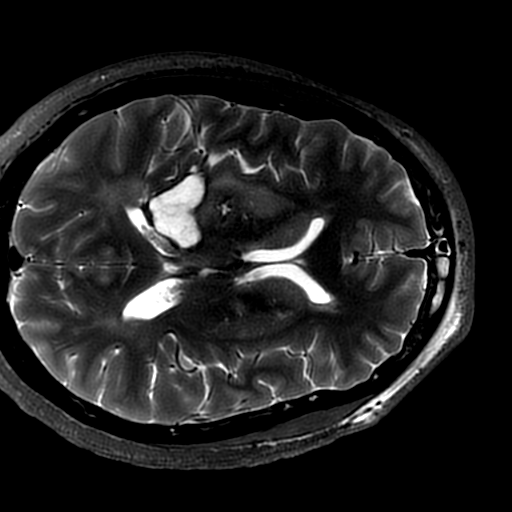

[19 of 48 positions shown; findings below may reference images not displayed]

FINDINGS: Orbits: No postseptal edema or unexpected enhancement. Globes are
symmetric and unremarkable. Extraocular muscles and lacrimal glands
are symmetric and unremarkable. There is no abnormal enhancement of
the optic nerve sheath complexes.

Soft tissues: Left periorbital edema and enhancement. Partially
imaged left facial soft tissue swelling and enhancement

Paranasal sinuses: Diffuse paranasal sinus mucosal thickening with
significant opacification.

Mastoids: Bilateral effusions.

Limited Brain: Subacute blood products are identified in areas of
hemorrhage seen on prior imaging. Largest hematoma spanning the
thalamus to the subinsular white matter and extending into the
midbrain. Blood products also present along prior right ventricular
catheter tract. Edema has substantially decreased with reduction
mass effect. Areas of T2 hyperintensity in the central white matter
bilaterally likely correspond to infarcts on prior study. T2
hyperintensity enhancement in the right temporal lobe corresponds to
infarct on prior study.
IMPRESSION: Left periorbital cellulitis without evidence of postseptal
extension. Partially imaged inflammatory changes in the left face.
Consider odontogenic source.

Evolving areas of hemorrhage and infarction in the partially
evaluate brain.

Diffuse paranasal sinus inflammatory changes. Acute sinusitis is
MANCILLA DIAZ in the appropriate clinical setting.

## 2020-09-21 MED ORDER — GADOBUTROL 1 MMOL/ML IV SOLN
7.0000 mL | Freq: Once | INTRAVENOUS | Status: AC | PRN
  Administered 2020-09-21: 7 mL via INTRAVENOUS

## 2020-09-21 MED ORDER — CEFAZOLIN SODIUM-DEXTROSE 1-4 GM/50ML-% IV SOLN
1.0000 g | Freq: Three times a day (TID) | INTRAVENOUS | Status: AC
  Administered 2020-09-21 – 2020-09-28 (×21): 1 g via INTRAVENOUS
  Filled 2020-09-21 (×21): qty 50

## 2020-09-21 NOTE — Progress Notes (Signed)
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  Subsequently on 2/10, she developed obtundation and respiratory arrest leading to intubation.  She has remained persistently obtunded, now has a trach and a PEG.  Hospital course complicated by persistent fever and Serratia pneumonia  2/09 Admit for ICH 2/10 EVD placed, later intubated due to obtundation and respiratory arrest 2/11 MRI Brain, hypertonic saline d/c'd, EVD not working. Husband did not consent to PICC  2/13 EVD removed 2/17 [place] discussion with family, PMT 2/24 Bedside trach and PEG 3/1 Transferred to [place]  Subjective / 24h Interval events: Remains obtunded  Assessment & Plan: Principal Problem Multiple acute strokes  Right Thalamus ICH with IVH  Obstructive Hydrocephalus Acute Metabolic Encephalopathy - CT 2/9 with acute hemorrhage with epicenter in the right thalamus.  Intraventricular penetration with blood filling the 3rd and forth ventricles and nearly filling the right lateral ventricle. MRI 2/11 with intraparenchymal hemorrhage centered within the right thalamus and basal ganglia extending into the right midbrain with intraventricular extension.  Effacement of prepontine cistern and similar 4 mm of leftward midline shift.  Separate areas of ristricted diffusion and edema involving the right greater than left mesial temporal lobes/parahippocampal cortex, bilateral basal ganglia, and possibly the upper pons that are concerning for acute infarcts, most likely 2/2 mass effect from the hemorrhage (see report)/ Per neuro "scattered b/l hippocampus, posterior CC, bilateral thalamus, and upper pons, likely due to deep penetrating vessel compression by focal mass-effect".  2/19 MRI with  acute to early subacute infarcts in the bilateral cerebral white matter and right cerebellum, new from 2/11 - interval evolution of multiple other infarcts present on prior MRI, increased hemorrhage along old right EVD tract (see report) - 2/21 head CT with expected evolution of ICH since 2/13 - echo with normal EF, no intracardiac source of embolism (see report) - CTA head/neck without intracranial arterial occlusion or high grade stenosis, no dissection, aneurysm, or hemodynamically significant stenosis of the carotid or vertebral arteries - per neurology - see last follow up note from 2/25 - ICH likely 2/2 hypertension and nonadherence to meds - s/p trach/peg on 2/24 - no antithrombotic 2/2 ICH - for obstructive hydrocephalus -> s/p external ventricular catheter placement on 08/27/20 by neurosurgery - EVD clamped on 2/12 and d/c'd on 2/13 - EEG suggestive of cortical dysfunction in right hemisphere likely 2/2 underlying bleed as well as severe diffuse encephalopathy, nonspecific etiology  -only withdraws to painful stimuli - will need to continue goals of care/palliative  -seizure precautions, aspiration precautions, nutrition, bowel regimen -no significant improvement in her mental status   Active Problems Hypertensive emergency -Home medications include Coreg and HCTZ -currently on metoprolol  -hydral and labetalol prn -SBP goal remains < 160  Fever  Serratia Pneumonia  Hospital Acquired Pneumonia:  -Tracheal aspirate from 2/25 with serratia -There is also concern for central fever, started on buspirone -CXR with bilateral lower lobe opacities (L>R concerning for pneumonia).    Sputum cultures growing again Serratia, sensitive to cefepime. Status post second round of cefepime, now monitor off antibiotics. Still has intermittent fevers however overall fever curve improving   Acute hypoxemic respiratory failure secondary to CVA -S/P Trach -continue on trach collar -pulm continuing to  follow for trach -Currently on 5 L  Dysphagia -s/p peg on tube feeds, seems to continue to tolerate well  Possible bacterial sinusitis -Off antibiotics now  Bacterial Conjunctivitis -left eye, still with persistent upper eyelid swelling. Concern for deeper issue, MRI of the orbits still pending this morning  -continue erythromycin ointment -Continue to monitor  Anasarca -bilateral upper extremity edema, minimal LE edema - suspect related to prolonged hospitalization, hypoalbuminemia -upper extremity US -> age indeterminate SVT of right cephalic vein  Elevated LFT's -mild, continue to monitor overall stable  Leukocytosis -Resolved on antibiotics  Anemia -Stable, follow  Scheduled Meds: .  stroke: mapping our early stages of recovery book   Does not apply Once  . atorvastatin  40 mg Per Tube Daily  . chlorhexidine gluconate (MEDLINE KIT)  15 mL Mouth Rinse BID  . Chlorhexidine Gluconate Cloth  6 each Topical Daily  . enoxaparin (LOVENOX) injection  40 mg Subcutaneous Q24H  . erythromycin  1 application Left Eye E0C  . feeding supplement (PROSource TF)  45 mL Per Tube Daily  . free water  100 mL Per Tube Q4H  . insulin aspart  0-9 Units Subcutaneous Q4H  . mouth rinse  15 mL Mouth Rinse 10 times per day  . metoprolol tartrate  25 mg Per Tube BID  . pantoprazole sodium  40 mg Per Tube BID  . scopolamine  1 patch Transdermal Q72H   Continuous Infusions: . sodium chloride 100 mL/hr at 09/10/20 1024  . feeding supplement (OSMOLITE 1.2 CAL) 1,000 mL (09/20/20 1244)   PRN Meds:.sodium chloride, acetaminophen **OR** acetaminophen (TYLENOL) oral liquid 160 mg/5 mL **OR** acetaminophen, hydrALAZINE, labetalol, oxyCODONE, polyethylene glycol, senna-docusate  Diet Orders (From admission, onward)    Start     Ordered   09/10/20 0001  Diet NPO time specified  Diet effective midnight        09/09/20 1421          DVT prophylaxis: enoxaparin (LOVENOX) injection 40 mg Start:  09/01/20 1600 SCD's Start: 08/27/20 0026     Code Status: Full Code  Family Communication: no family at bedside this morning   Status is: Inpatient  Remains inpatient appropriate because:Inpatient level of care appropriate due to severity of illness   Dispo: The patient is from: Home              Anticipated d/c is to: SNF              Patient currently is not medically stable to d/c.   Difficult to place patient No   Level of care: Progressive  Consultants:  Palliative care Pulmonary  Neurology   Procedures:  Lurline Idol, PEG  Microbiology  None   Antimicrobials: None currently   Objective: Vitals:   09/20/20 2317 09/21/20 0344 09/21/20 0346 09/21/20 0732  BP: (!) 157/110  (!) 139/99   Pulse: (!) 108 (!) 115 (!) 114 (!) 102  Resp:  (!) 26 (!) 21 (!) 25  Temp: 98.1 F (36.7 C)  99.4 F (37.4 C)   TempSrc: Oral  Axillary   SpO2:  97% 97% 96%  Weight:      Height:        Intake/Output Summary (Last 24 hours) at 09/21/2020 0913 Last data filed at 09/21/2020 0348 Gross per 24 hour  Intake -  Output 850 ml  Net -850 ml   Filed Weights   09/15/20 0405 09/16/20 0500 09/17/20 0500  Weight: 70.8 kg 70.9 kg 70.8 kg    Examination:  Constitutional: obtunded Eyes: no icterus, left  upper eyelid swelling ENMT: mmm Neck: normal, supple Respiratory: diminished at the bases but overall clear, no wheezing, no crackles  Cardiovascular: rrr, no mrg, tachycardic, mild anasarca throughout Abdomen: soft, nt, nd, PEG tube in place  Musculoskeletal: no clubbing / cyanosis.  Skin: no rashes  Neurologic: obtunded   Data Reviewed: I have independently reviewed following labs and imaging studies   CBC: Recent Labs  Lab 09/15/20 0856 09/16/20 0353 09/17/20 0330 09/18/20 0414 09/20/20 0227 09/21/20 0301  WBC 10.7* 10.1 9.8 9.9 10.0 9.2  NEUTROABS 8.0* 7.8*  --   --   --   --   HGB 9.0* 9.0* 8.3* 8.4* 8.8* 9.3*  HCT 28.3* 27.7* 25.7* 27.1* 28.5* 28.6*  MCV 80.6 80.1  80.6 81.9 81.7 80.1  PLT 481* 467* 411* 431* 433* 578*   Basic Metabolic Panel: Recent Labs  Lab 09/15/20 0856 09/16/20 0353 09/17/20 0330 09/18/20 0414 09/20/20 0227 09/21/20 0301  NA 140 141 140 138 140 139  K 4.3 4.4 4.0 4.1 3.9 4.0  CL 104 106 106 105 101 101  CO2 _0 GLUCOSE 137* 132* 135* 142* 118* 140*  BUN _1 21* 21*  CREATININE 0.90 0.86 0.82 0.83 0.81 0.86  CALCIUM 8.7* 8.9 8.5* 8.6* 9.1 9.2  MG 2.0 2.1 2.1 2.0  --   --   PHOS 4.4 4.5  --  4.0  --   --    Liver Function Tests: Recent Labs  Lab 09/15/20 0856 09/16/20 0353 09/17/20 0330 09/18/20 0414  AST 58* 66* 50* 40  ALT 57* 62* 55* 47*  ALKPHOS 113 108 101 90  BILITOT 0.3 0.5 0.4 0.4  PROT 6.2* 6.4* 5.9* 6.2*  ALBUMIN 1.8* 1.9* 1.7* 1.8*   Coagulation Profile: No results for input(s): INR, PROTIME in the last 168 hours. HbA1C: No results for input(s): HGBA1C in the last 72 hours. CBG: Recent Labs  Lab 09/20/20 1522 09/20/20 2027 09/20/20 2316 09/21/20 0350 09/21/20 0747  GLUCAP 120* 128* 124* 122* 120*    Recent Results (from the past 240 hour(s))  Culture, blood (routine x 2)     Status: None   Collection Time: 09/11/20  2:15 PM   Specimen: BLOOD LEFT HAND  Result Value Ref Range Status   Specimen Description BLOOD LEFT HAND  Final   Special Requests   Final    BOTTLES DRAWN AEROBIC AND ANAEROBIC Blood Culture adequate volume   Culture   Final    NO GROWTH 5 DAYS Performed at [facility], [place] [address], [place], [place] [address]    Report Status 09/16/2020 FINAL  Final  Culture, blood (routine x 2)     Status: None   Collection Time: 09/11/20  2:25 PM   Specimen: BLOOD LEFT FOREARM  Result Value Ref Range Status   Specimen Description BLOOD LEFT FOREARM  Final   Special Requests   Final    BOTTLES DRAWN AEROBIC AND ANAEROBIC Blood Culture adequate volume   Culture   Final    NO GROWTH 5 DAYS Performed at [facility], [place] [address],  [place], [place] [address]    Report Status 09/16/2020 FINAL  Final  Culture, Respiratory w Gram Stain     Status: None   Collection Time: 09/11/20  2:39 PM   Specimen: Tracheal Aspirate; Respiratory  Result Value Ref Range Status   Specimen Description TRACHEAL ASPIRATE  Final   Special Requests Normal  Final  Gram Stain   Final    ABUNDANT WBC PRESENT,BOTH PMN AND MONONUCLEAR MODERATE GRAM NEGATIVE RODS RARE GRAM VARIABLE ROD Performed at [facility], [place] [address] [place], [place] [address]    Culture ABUNDANT SERRATIA MARCESCENS  Final   Report Status 09/13/2020 FINAL  Final   Organism ID, Bacteria SERRATIA MARCESCENS  Final      Susceptibility   Serratia marcescens - MIC*    CEFAZOLIN >=64 RESISTANT Resistant     CEFEPIME <=0.12 SENSITIVE Sensitive     CEFTAZIDIME <=1 SENSITIVE Sensitive     CEFTRIAXONE <=0.25 SENSITIVE Sensitive     CIPROFLOXACIN <=0.25 SENSITIVE Sensitive     GENTAMICIN <=1 SENSITIVE Sensitive     TRIMETH/SULFA <=20 SENSITIVE Sensitive     * ABUNDANT SERRATIA MARCESCENS  Culture, blood (routine x 2)     Status: None   Collection Time: 09/15/20  8:57 AM   Specimen: BLOOD  Result Value Ref Range Status   Specimen Description BLOOD BLOOD LEFT FOREARM  Final   Special Requests   Final    BOTTLES DRAWN AEROBIC AND ANAEROBIC Blood Culture adequate volume   Culture   Final    NO GROWTH 5 DAYS Performed at [facility], [address], [place], [place] [address]    Report Status 09/20/2020 FINAL  Final  Culture, blood (routine x 2)     Status: None   Collection Time: 09/15/20  9:03 AM   Specimen: BLOOD LEFT HAND  Result Value Ref Range Status   Specimen Description BLOOD LEFT HAND  Final   Special Requests   Final    BOTTLES DRAWN AEROBIC AND ANAEROBIC Blood Culture adequate volume   Culture   Final    NO GROWTH 5 DAYS Performed at [facility], [place] [address] [place], [place] [address]    Report Status 09/20/2020 FINAL  Final   Culture, Respiratory w Gram Stain     Status: None   Collection Time: 09/15/20 11:28 AM   Specimen: Tracheal Aspirate; Respiratory  Result Value Ref Range Status   Specimen Description TRACHEAL ASPIRATE  Final   Special Requests NONE  Final   Gram Stain   Final    ABUNDANT WBC PRESENT,BOTH PMN AND MONONUCLEAR FEW SQUAMOUS EPITHELIAL CELLS PRESENT ABUNDANT GRAM NEGATIVE RODS FEW GRAM POSITIVE COCCI Performed at [facility], [place] [address] [place], [place] [address]    Culture FEW SERRATIA MARCESCENS  Final   Report Status 09/17/2020 FINAL  Final   Organism ID, Bacteria SERRATIA MARCESCENS  Final      Susceptibility   Serratia marcescens - MIC*    CEFAZOLIN >=64 RESISTANT Resistant     CEFEPIME <=0.12 SENSITIVE Sensitive     CEFTAZIDIME <=1 SENSITIVE Sensitive     CEFTRIAXONE <=0.25 SENSITIVE Sensitive     CIPROFLOXACIN <=0.25 SENSITIVE Sensitive     GENTAMICIN <=1 SENSITIVE Sensitive     TRIMETH/SULFA <=20 SENSITIVE Sensitive     * FEW SERRATIA MARCESCENS     Radiology Studies: No results found. Marzetta Board, MD, PhD [facility]  Between 7 am - 7 pm I am available, please contact me via Amion or Securechat  Between 7 pm - 7 am I am not available, please contact night coverage MD/APP via Amion

## 2020-09-21 NOTE — Plan of Care (Signed)
Pt not progressing at this time due to diagnosis and altered mental status and decreased level of alertness.   Problem: Education: Goal: Knowledge of disease or condition will improve Outcome: Not Progressing Goal: Knowledge of secondary prevention will improve Outcome: Not Progressing Goal: Knowledge of patient specific risk factors addressed and post discharge goals established will improve Outcome: Not Progressing Goal: Individualized Educational Video(s) Outcome: Not Progressing   Problem: Coping: Goal: Will verbalize positive feelings about self Outcome: Not Progressing Goal: Will identify appropriate support needs Outcome: Not Progressing   Problem: Health Behavior/Discharge Planning: Goal: Ability to manage health-related needs will improve Outcome: Not Progressing   Problem: Self-Care: Goal: Ability to participate in self-care as condition permits will improve Outcome: Not Progressing Goal: Verbalization of feelings and concerns over difficulty with self-care will improve Outcome: Not Progressing Goal: Ability to communicate needs accurately will improve Outcome: Not Progressing   Problem: Nutrition: Goal: Risk of aspiration will decrease Outcome: Not Progressing Goal: Dietary intake will improve Outcome: Not Progressing   Problem: Intracerebral Hemorrhage Tissue Perfusion: Goal: Complications of Intracerebral Hemorrhage will be minimized Outcome: Not Progressing   Problem: Education: Goal: Knowledge of General Education information will improve Description: Including pain rating scale, medication(s)/side effects and non-pharmacologic comfort measures Outcome: Not Progressing   Problem: Health Behavior/Discharge Planning: Goal: Ability to manage health-related needs will improve Outcome: Not Progressing   Problem: Clinical Measurements: Goal: Ability to maintain clinical measurements within normal limits will improve Outcome: Not Progressing Goal: Will  remain free from infection Outcome: Not Progressing Goal: Diagnostic test results will improve Outcome: Not Progressing Goal: Respiratory complications will improve Outcome: Not Progressing Goal: Cardiovascular complication will be avoided Outcome: Not Progressing   Problem: Nutrition: Goal: Adequate nutrition will be maintained Outcome: Not Progressing

## 2020-09-22 DIAGNOSIS — Z930 Tracheostomy status: Secondary | ICD-10-CM

## 2020-09-22 LAB — GLUCOSE, CAPILLARY
Glucose-Capillary: 115 mg/dL — ABNORMAL HIGH (ref 70–99)
Glucose-Capillary: 124 mg/dL — ABNORMAL HIGH (ref 70–99)
Glucose-Capillary: 108 mg/dL — ABNORMAL HIGH (ref 70–99)
Glucose-Capillary: 143 mg/dL — ABNORMAL HIGH (ref 70–99)
Glucose-Capillary: 129 mg/dL — ABNORMAL HIGH (ref 70–99)

## 2020-09-22 LAB — CBC
HCT: 28 % — ABNORMAL LOW (ref 36.0–46.0)
Hemoglobin: 8.9 g/dL — ABNORMAL LOW (ref 12.0–15.0)
MCH: 25.6 pg — ABNORMAL LOW (ref 26.0–34.0)
MCHC: 31.8 g/dL (ref 30.0–36.0)
MCV: 80.7 fL (ref 80.0–100.0)
Platelets: 428 10*3/uL — ABNORMAL HIGH (ref 150–400)
RBC: 3.47 MIL/uL — ABNORMAL LOW (ref 3.87–5.11)
RDW: 16.9 % — ABNORMAL HIGH (ref 11.5–15.5)
WBC: 10.4 10*3/uL (ref 4.0–10.5)
nRBC: 0 % (ref 0.0–0.2)

## 2020-09-22 LAB — COMPREHENSIVE METABOLIC PANEL
ALT: 76 U/L — ABNORMAL HIGH (ref 0–44)
AST: 83 U/L — ABNORMAL HIGH (ref 15–41)
Albumin: 2.1 g/dL — ABNORMAL LOW (ref 3.5–5.0)
Alkaline Phosphatase: 108 U/L (ref 38–126)
Anion gap: 10 (ref 5–15)
BUN: 20 mg/dL (ref 6–20)
CO2: 29 mmol/L (ref 22–32)
Calcium: 9.2 mg/dL (ref 8.9–10.3)
Chloride: 100 mmol/L (ref 98–111)
Creatinine, Ser: 0.8 mg/dL (ref 0.44–1.00)
GFR, Estimated: >60 mL/min (ref >60)
Glucose, Bld: 119 mg/dL — ABNORMAL HIGH (ref 70–99)
Potassium: 3.9 mmol/L (ref 3.5–5.1)
Sodium: 139 mmol/L (ref 135–145)
Total Bilirubin: 0.5 mg/dL (ref 0.3–1.2)
Total Protein: 6.7 g/dL (ref 6.5–8.1)

## 2020-09-22 NOTE — Plan of Care (Signed)
  Problem: Education: Goal: Knowledge of disease or condition will improve Outcome: Not Progressing Goal: Knowledge of secondary prevention will improve Outcome: Not Progressing Goal: Knowledge of patient specific risk factors addressed and post discharge goals established will improve Outcome: Not Progressing Goal: Individualized Educational Video(s) Outcome: Not Progressing   Problem: Coping: Goal: Will verbalize positive feelings about self Outcome: Not Progressing Goal: Will identify appropriate support needs Outcome: Not Progressing   Problem: Health Behavior/Discharge Planning: Goal: Ability to manage health-related needs will improve Outcome: Not Progressing   Problem: Self-Care: Goal: Ability to participate in self-care as condition permits will improve Outcome: Not Progressing Goal: Verbalization of feelings and concerns over difficulty with self-care will improve Outcome: Not Progressing Goal: Ability to communicate needs accurately will improve Outcome: Not Progressing   Problem: Nutrition: Goal: Risk of aspiration will decrease Outcome: Not Progressing Goal: Dietary intake will improve Outcome: Not Progressing   Problem: Intracerebral Hemorrhage Tissue Perfusion: Goal: Complications of Intracerebral Hemorrhage will be minimized Outcome: Not Progressing   Problem: Education: Goal: Knowledge of General Education information will improve Description Including pain rating scale, medication(s)/side effects and non-pharmacologic comfort measures Outcome: Not Progressing   Problem: Health Behavior/Discharge Planning: Goal: Ability to manage health-related needs will improve Outcome: Not Progressing   Problem: Clinical Measurements: Goal: Ability to maintain clinical measurements within normal limits will improve Outcome: Not Progressing Goal: Will remain free from infection Outcome: Not Progressing Goal: Diagnostic test results will improve Outcome: Not  Progressing Goal: Respiratory complications will improve Outcome: Not Progressing Goal: Cardiovascular complication will be avoided Outcome: Not Progressing   Problem: Activity: Goal: Risk for activity intolerance will decrease Outcome: Not Progressing   Problem: Nutrition: Goal: Adequate nutrition will be maintained Outcome: Not Progressing   Problem: Coping: Goal: Level of anxiety will decrease Outcome: Not Progressing   Problem: Elimination: Goal: Will not experience complications related to bowel motility Outcome: Not Progressing Goal: Will not experience complications related to urinary retention Outcome: Not Progressing   Problem: Pain Managment: Goal: General experience of comfort will improve Outcome: Not Progressing   Problem: Safety: Goal: Ability to remain free from injury will improve Outcome: Not Progressing   Problem: Skin Integrity: Goal: Risk for impaired skin integrity will decrease Outcome: Not Progressing   

## 2020-09-22 NOTE — Progress Notes (Signed)
PROGRESS NOTE  MONETTA LICK KCL:[id number] DOB: [birth year] DOA: 08/26/2020 PCP: Patient, [name]   LOS: 26 days   Brief Narrative / Interim history: 55 yo F with 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  Subsequently on 2/10, she developed obtundation and respiratory arrest leading to intubation.  She has remained persistently obtunded, now has a trach and a PEG.  Hospital course complicated by persistent fever, Serratia pneumonia and now pre-orbital cellulitis  2/09 Admit for ICH 2/10 EVD placed, later intubated due to obtundation and respiratory arrest 2/11 MRI Brain, hypertonic saline d/c'd, EVD not working. Husband did not consent to PICC  2/13 EVD removed 2/17 [place] discussion with family, PMT 2/24 Bedside trach and PEG 3/1 Transferred to [place]  Subjective / 24h Interval events: Remains unresponsive this morning  Assessment & Plan: Principal Problem Multiple acute strokes  Right Thalamus ICH with IVH  Obstructive Hydrocephalus Acute Metabolic Encephalopathy - CT 2/9 with acute hemorrhage with epicenter in the right thalamus.  Intraventricular penetration with blood filling the 3rd and forth ventricles and nearly filling the right lateral ventricle. MRI 2/11 with intraparenchymal hemorrhage centered within the right thalamus and basal ganglia extending into the right midbrain with intraventricular extension.  Effacement of prepontine cistern and similar 4 mm of leftward midline shift.  Separate areas of ristricted diffusion and edema involving the right greater than left mesial temporal lobes / parahippocampal cortex, bilateral basal ganglia, and possibly the upper pons that are concerning for acute infarcts, most likely 2/2 mass effect from the hemorrhage (see report)/ Per neuro "scattered b/l hippocampus, posterior CC, bilateral thalamus, and upper pons, likely due to deep penetrating vessel  compression by focal mass-effect".  2/19 MRI with acute to early subacute infarcts in the bilateral cerebral white matter and right cerebellum, new from 2/11 - interval evolution of multiple other infarcts present on prior MRI, increased hemorrhage along old right EVD tract (see report) - 2/21 head CT with expected evolution of ICH since 2/13 - echo with normal EF, no intracardiac source of embolism - CTA head/neck without intracranial arterial occlusion or high grade stenosis, no dissection, aneurysm, or hemodynamically significant stenosis of the carotid or vertebral arteries - per neurology - see last follow up note from 2/25 - ICH likely 2/2 hypertension and nonadherence to meds - s/p trach/peg on 2/24 - no antithrombotic 2/2 ICH -for obstructive hydrocephalus -> s/p external ventricular catheter placement on 08/27/20 by neurosurgery - EVD clamped on 2/12 and d/c'd on 2/13 -EEG suggestive of cortical dysfunction in right hemisphere likely 2/2 underlying bleed as well as severe diffuse encephalopathy, nonspecific etiology  -only withdraws to painful stimuli - will need to continue goals of care/palliative, on my discussion with the family they are still hopeful for recovery.  Discussed a little bit more with her mother at bedside today regarding what if the patient will never wake up and she shook her head no and does not consider this an option and is firmly convinced that her daughter will wake up -seizure precautions, aspiration precautions, nutrition, bowel regimen -No significant improvement in her mental status  Active Problems Hypertensive emergency -Home medications include Coreg and HCTZ -currently on metoprolol  -hydral and labetalol prn -SBP goal remains < 160  Fever  Serratia Pneumonia  Hospital Acquired Pneumonia:  -Tracheal aspirate from 2/25 with serratia -There is also concern for central fever, started on buspirone -CXR with bilateral lower lobe opacities (L>R concerning for  pneumonia). She is status post 2 courses of cefepime for her pneumonia.   Acute hypoxemic respiratory failure secondary to CVA -S/P Trach -continue on trach collar -pulm continuing to follow for trach -Currently on 5 L  Dysphagia -s/p peg on tube feeds, seems to continue to tolerate well  Possible bacterial sinusitis -Off antibiotics now  Bacterial Conjunctivitis /periorbital cellulitis -left eye had persistent upper eyelid swelling, she was initially placed on erythromycin ointment however the swelling has gotten worse, underwent an MRI of the orbits which showed left periorbital cellulitis without post septal extension.  She has been placed on Ancef, tolerating it well, swelling significantly improved today.  Plan for total of 5 days  Anasarca -bilateral upper extremity edema, minimal LE edema - suspect related to prolonged hospitalization, hypoalbuminemia -upper extremity US -> age indeterminate SVT of right cephalic vein  Elevated LFT's -mild, continue to monitor overall stable  Leukocytosis -Resolved on antibiotics  Anemia -Stable, follow  Scheduled Meds: .  stroke: mapping our early stages of recovery book   Does not apply Once  . atorvastatin  40 mg Per Tube Daily  . chlorhexidine gluconate (MEDLINE KIT)  15 mL Mouth Rinse BID  . Chlorhexidine Gluconate Cloth  6 each Topical Daily  . enoxaparin (LOVENOX) injection  40 mg Subcutaneous Q24H  . erythromycin  1 application Left Eye U2V  . feeding supplement (PROSource TF)  45 mL Per Tube Daily  . free water  100 mL Per Tube Q4H  . insulin aspart  0-9 Units Subcutaneous Q4H  . mouth rinse  15 mL Mouth Rinse 10 times per day  . metoprolol tartrate  25 mg Per Tube BID  . pantoprazole sodium  40 mg Per Tube BID  . scopolamine  1 patch Transdermal Q72H   Continuous Infusions: . sodium chloride 10 mL/hr at 09/22/20 0013  .  ceFAZolin (ANCEF) IV 1 g (09/22/20 0634)  . feeding supplement (OSMOLITE 1.2 CAL) 1,000 mL  (09/21/20 1410)   PRN Meds:.sodium chloride, acetaminophen **OR** acetaminophen (TYLENOL) oral liquid 160 mg/5 mL **OR** acetaminophen, hydrALAZINE, labetalol, oxyCODONE, polyethylene glycol, senna-docusate  Diet Orders (From admission, onward)    Start     Ordered   09/10/20 0001  Diet NPO time specified  Diet effective midnight        09/09/20 1421          DVT prophylaxis: enoxaparin (LOVENOX) injection 40 mg Start: 09/01/20 1600 SCD's Start: 08/27/20 0026     Code Status: Full Code  Family Communication: Mother at bedside this morning  Status is: Inpatient  Remains inpatient appropriate because:Inpatient level of care appropriate due to severity of illness   Dispo: The patient is from: Home              Anticipated d/c is to: SNF              Patient currently is not medically stable to d/c.   Difficult to place patient No   Level of care: Progressive  Consultants:  Palliative care Pulmonary  Neurology   Procedures:  Lurline Idol, PEG  Microbiology  None   Antimicrobials: None currently   Objective: Vitals:   09/22/20 0450 09/22/20 0637 09/22/20 0717 09/22/20 0800  BP:  (!) 165/105 (!) 168/104   Pulse: 98  (!) 112 (!) 113  Resp: 20  (!) 23 (!) 21  Temp:  98.6 F (37 C) 98.3 F (36.8 C)   TempSrc:  Axillary Axillary   SpO2: 100%  96% 98%  Weight:      Height:        Intake/Output Summary (Last 24 hours) at 09/22/2020 8341 Last data filed at 09/22/2020 0347 Gross per 24 hour  Intake 130.25 ml  Output 1225 ml  Net -1094.75 ml   Filed Weights   09/15/20 0405 09/16/20 0500 09/17/20 0500  Weight: 70.8 kg 70.9 kg 70.8 kg    Examination:  Constitutional: Unresponsive Eyes: Left upper eyelid swelling is better today ENMT: Moist mucous membranes Neck: normal, supple Respiratory: Diminished at the bases but overall clear without wheezing or crackles Cardiovascular: Regular rate and rhythm, no murmurs, tachycardic Abdomen: Soft, nontender,  nondistended, PEG tube in place Musculoskeletal: no clubbing / cyanosis.  Skin: No rashes seen Neurologic: Unresponsive   Data Reviewed: I have independently reviewed following labs and imaging studies   CBC: Recent Labs  Lab 09/15/20 0856 09/16/20 0353 09/17/20 0330 09/18/20 0414 09/20/20 0227 09/21/20 0301 09/22/20 0334  WBC 10.7* 10.1 9.8 9.9 10.0 9.2 10.4  NEUTROABS 8.0* 7.8*  --   --   --   --   --   HGB 9.0* 9.0* 8.3* 8.4* 8.8* 9.3* 8.9*  HCT 28.3* 27.7* 25.7* 27.1* 28.5* 28.6* 28.0*  MCV 80.6 80.1 80.6 81.9 81.7 80.1 80.7  PLT 481* 467* 411* 431* 433* 458* 962*   Basic Metabolic Panel: Recent Labs  Lab 09/15/20 0856 09/16/20 0353 09/17/20 0330 09/18/20 0414 09/20/20 0227 09/21/20 0301 09/22/20 0334  NA 140 141 140 138 140 139 139  K 4.3 4.4 4.0 4.1 3.9 4.0 3.9  CL 104 106 106 105 101 101 100  CO2 _0 GLUCOSE 137* 132* 135* 142* 118* 140* 119*  BUN _1 21* 21* 20  CREATININE 0.90 0.86 0.82 0.83 0.81 0.86 0.80  CALCIUM 8.7* 8.9 8.5* 8.6* 9.1 9.2 9.2  MG 2.0 2.1 2.1 2.0  --   --   --   PHOS 4.4 4.5  --  4.0  --   --   --    Liver Function Tests: Recent Labs  Lab 09/15/20 0856 09/16/20 0353 09/17/20 0330 09/18/20 0414 09/22/20 0334  AST 58* 66* 50* 40 83*  ALT 57* 62* 55* 47* 76*  ALKPHOS 113 108 101 90 108  BILITOT 0.3 0.5 0.4 0.4 0.5  PROT 6.2* 6.4* 5.9* 6.2* 6.7  ALBUMIN 1.8* 1.9* 1.7* 1.8* 2.1*   Coagulation Profile: No results for input(s): INR, PROTIME in the last 168 hours. HbA1C: No results for input(s): HGBA1C in the last 72 hours. CBG: Recent Labs  Lab 09/21/20 1602 09/21/20 2015 09/21/20 2331 09/22/20 0346 09/22/20 0805  GLUCAP 113* 113* 134* 115* 124*    Recent Results (from the past 240 hour(s))  Culture, blood (routine x 2)     Status: None   Collection Time: 09/15/20  8:57 AM   Specimen: BLOOD  Result Value Ref Range Status   Specimen Description BLOOD BLOOD LEFT FOREARM  Final   Special  Requests   Final    BOTTLES DRAWN AEROBIC AND ANAEROBIC Blood Culture adequate volume   Culture   Final    NO GROWTH 5 DAYS Performed at [facility], [place] [name] [place], [place] [address]    Report Status 09/20/2020 FINAL  Final  Culture, blood (routine x 2)     Status: None   Collection Time: 09/15/20  9:03 AM   Specimen: BLOOD LEFT HAND  Result Value Ref Range Status  Specimen Description BLOOD LEFT HAND  Final   Special Requests   Final    BOTTLES DRAWN AEROBIC AND ANAEROBIC Blood Culture adequate volume   Culture   Final    NO GROWTH 5 DAYS Performed at [facility], [address] [place], [place] [address]    Report Status 09/20/2020 FINAL  Final  Culture, Respiratory w Gram Stain     Status: None   Collection Time: 09/15/20 11:28 AM   Specimen: Tracheal Aspirate; Respiratory  Result Value Ref Range Status   Specimen Description TRACHEAL ASPIRATE  Final   Special Requests NONE  Final   Gram Stain   Final    ABUNDANT WBC PRESENT,BOTH PMN AND MONONUCLEAR FEW SQUAMOUS EPITHELIAL CELLS PRESENT ABUNDANT GRAM NEGATIVE RODS FEW GRAM POSITIVE COCCI Performed at [facility], [place] [address] [place], [place] [address]    Culture FEW SERRATIA MARCESCENS  Final   Report Status 09/17/2020 FINAL  Final   Organism ID, Bacteria SERRATIA MARCESCENS  Final      Susceptibility   Serratia marcescens - MIC*    CEFAZOLIN >=64 RESISTANT Resistant     CEFEPIME <=0.12 SENSITIVE Sensitive     CEFTAZIDIME <=1 SENSITIVE Sensitive     CEFTRIAXONE <=0.25 SENSITIVE Sensitive     CIPROFLOXACIN <=0.25 SENSITIVE Sensitive     GENTAMICIN <=1 SENSITIVE Sensitive     TRIMETH/SULFA <=20 SENSITIVE Sensitive     * FEW SERRATIA MARCESCENS     Radiology Studies: MR ORBITS W WO CONTRAST  Result Date: 09/21/2020 CLINICAL DATA:  Orbital cellulitis EXAM: MRI OF THE ORBITS WITHOUT AND WITH CONTRAST TECHNIQUE: Multiplanar, multisequence MR imaging of the orbits was performed both  before and after the administration of intravenous contrast. CONTRAST:  18m GADAVIST GADOBUTROL 1 MMOL/ML IV SOLN COMPARISON:  None. FINDINGS: Orbits: No postseptal edema or unexpected enhancement. Globes are symmetric and unremarkable. Extraocular muscles and lacrimal glands are symmetric and unremarkable. There is no abnormal enhancement of the optic nerve sheath complexes. Soft tissues: Left periorbital edema and enhancement. Partially imaged left facial soft tissue swelling and enhancement Paranasal sinuses: Diffuse paranasal sinus mucosal thickening with significant opacification. Mastoids: Bilateral effusions. Limited Brain: Subacute blood products are identified in areas of hemorrhage seen on prior imaging. Largest hematoma spanning the thalamus to the subinsular white matter and extending into the midbrain. Blood products also present along prior right ventricular catheter tract. Edema has substantially decreased with reduction mass effect. Areas of T2 hyperintensity in the central white matter bilaterally likely correspond to infarcts on prior study. T2 hyperintensity enhancement in the right temporal lobe corresponds to infarct on prior study. IMPRESSION: Left periorbital cellulitis without evidence of postseptal extension. Partially imaged inflammatory changes in the left face. Consider odontogenic source. Evolving areas of hemorrhage and infarction in the partially evaluate brain. Diffuse paranasal sinus inflammatory changes. Acute sinusitis is wells in the appropriate clinical setting. Electronically Signed   By: PMacy MisM.D.   On: 09/21/2020 14:21   CMarzetta Board MD, PhD [facility]  Between 7 am - 7 pm I am available, please contact me via Amion or Securechat  Between 7 pm - 7 am I am not available, please contact night coverage MD/APP via Amion

## 2020-09-22 NOTE — Progress Notes (Signed)
NAME:  Charlene [name], MRN:  [id number], DOB:  [birth year], LOS: 26 ADMISSION DATE:  08/26/2020, CONSULTATION DATE:  2/10 REFERRING MD:  Dr. Roda Shutters, CHIEF COMPLAINT:  ICH   Brief History:  55 year old female admitted with hypertensive R thalamic/basal ganglia ICH. Resultant obstructive hydrocephalus prompting EVD placement 2/10. Subsequently, 2/10 she had acute mental status change to obtundation and respiratory arrest resulting in intubation.  Past Medical History:  HTN, medication noncompliance  Significant Hospital Events:  2/09 Admit for ICH 2/10 EVD placed, later intubated due to obtundation and respiratory arrest 2/11 MRI Brain, hypertonic saline d/c'd, EVD not working.  Husband did not consent to PICC  2/13 EVD removed 2/17 GOC discussion with family, PMT  Consults:  Stroke PCCM Neurosurgery  Procedures:  ETT 2/10 >> EVD 2/10 > non functioning 2/11 at 1500; clamped 2/12 am > 2/13 Trach and diagnostic Bronchoscopy 2/24>> Dr. Denese Killings PEG placement 2/24>> Dr. Sheliah Hatch  Significant Diagnostic Tests:  CT Head 2/9 >> Acute hemorrhage with the epicenter in the right thalamus. Measurement is approximately 3.2 x 2.1 x 2.5 cm (volume 8.8 cm^3). Intraventricular penetration with blood filling the third and fourth ventricles and nearly filling the right lateral ventricle. No hydrocephalus. CTA Head/Neck 2/10 >> Unchanged size of intraparenchymal hemorrhage centered in the right thalamus and basal ganglia with extension into the right lateral ventricle. 2. No intracranial arterial occlusion or high-grade stenosis. No dissection, aneurysm or hemodynamically significant stenosis of the carotid or vertebral arteries. CT Head 2/10 >> Stable parenchymal hemorrhage in the right thalamus and basal ganglia with intraventricular extension as described. No new focal abnormality is noted. CT Head 2/10 (post intubation) >> redemonstrated intraparenchymal hemorrhage centered within the R thalamus/basal  ganglia extending into the cerebral peduncle/midbrain with associated intraventricular extension and hemorrhage in the R lateral ventricle and fourth ventricle, overall not significantly changed from prior; stable mass effect, 68mm of R to L midline shift, postsurgical changes of ventriculostomy TTE 2/10 >> LVEF 60 to 65%, LV has normal function, no regional wall motion abnormalities, mild concentric LVH.  RV systolic function is normal, normal pulmonary artery systolic pressure.  MRA head 2/11 >>Negative intracranial MR angiography. No evidence of aneurysm or high flow vascular malformation to explain the intraparenchymal hemorrhage  MRI brain 2/11 >> Similar intraparenchymal hemorrhage centered within the right thalamus and basal ganglia extending into the right midbrain with intraventricular extension, effacement of the prepontine cistern and similar 4 mm of leftward midline shift; separate areas of restricted diffusion and edema involving the R > L mesial temporal lobes/parahippocampal cortex, bilateral basal ganglia, and possibly the upper pons that are c/f acute infarcts, most likely secondary to mass effect from hemorrhage. MRI brain 2/19 >> intraventricular clot has decreased in size but mid-brain parenchymal clot has not and there are more ischemic infarcts in the surrounding brain.  CT Head WO Contrast 09/07/20>> Expected evolution of intracranial hemorrhage since 08/30/2020 head CT. Multiple areas of low attenuation corresponding to some of the infarcts seen on prior MRI. Ventricle caliber similar to prior MRI   Micro Data:  2/9 COVID + Influenza negative 2/10 MRSA PCR negative 2/12 BCx 2 > no growth  2/19 BC 1/2 +Stap EPI, 1/2 no growth   Antimicrobials:  Cefepime 2/12 > 2/15 Vanc 2/12 > 2/15  Interim History / Subjective:  No sig issues  Objective   Blood pressure (Abnormal) 120/94, pulse 95, temperature 98.3 F (36.8 C), temperature source Axillary, resp. rate (Abnormal) 23, height  5\' 5"  (1.651 m), weight  70.8 kg, last menstrual period 03/03/2014, SpO2 97 %, unknown if currently breastfeeding.    FiO2 (%):  [21 %] 21 %   Intake/Output Summary (Last 24 hours) at 09/22/2020 1125 Last data filed at 09/22/2020 1117 Gross per 24 hour  Intake 130.25 ml  Output 1675 ml  Net -1544.75 ml   Filed Weights   09/15/20 0405 09/16/20 0500 09/17/20 0500  Weight: 70.8 kg 70.9 kg 70.8 kg   Examination: General this is a 55 year old female. She is currently lying in bed. No distress HENT 6 cuffed trach is intact. The balloon is deflated. Still has yellow thick tracheal secretions pulm scattered rhonchi Card RRR abd soft Neuro awake. Does not follow commands. 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 I am doubtful that she is a candidate for extubation BUT only time will tell  Plan/rec Cont routine trach care Plan to change to cuffless trach 3/10  Simonne Martinet ACNP-BC [facility] Pulmonary/Critical Care Pager # [phone] OR # [phone] if no answer

## 2020-09-22 NOTE — Progress Notes (Signed)
Physical Therapy Treatment Patient Details Name: CIANNI MANNY MRN: [id number] DOB: 1966-05-24 Today's Date: 09/22/2020    History of Present Illness Pt is 55 yo female presents to [facility] on 2/9 with L sided weakness and headaches. PMH includes anxiety, HTN. 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 s/p Right frontal ventricular catheter placement on 2/10, stopped functioning on 2/11.  MRI 2/11 shows right greater than left mesial temporal lobes / parahippocampal cortex, bilateral basal ganglia, and possibly the upper pons that are concerning for acute infarcts, most likely secondary to mass effect from the hemorrhage. ETT 2/10-present.  Pt with trach and PEG on 09/10/20    PT Comments    Patient received in bed, still unresponsive and obtunded, mother present and supportive of ongoing PT. Focused on neck stretching and ROM due to tendency to rotate to the left and somewhat laterally flex with moderate tone noted, also worked on UE ROM and stretching but still limited by extensor tone and posturing. Able to reposition neck passively using folded towels to try to reduce amount of cervical rotation/avoid contracture. HR 110-125 throughout session. Also had BP elevation to 167/109 after working on UEs and neck so stopped therapy at this point. Left in bed positioned to comfort with all needs met, mother present. Will continue to follow.      Follow Up Recommendations  SNF;Supervision/Assistance - 24 hour     Equipment Recommendations  Hospital bed    Recommendations for Other Services       Precautions / Restrictions Precautions Precautions: Fall;Other (comment) (trach and PEG) Precaution Comments: watch BP and HR Restrictions Weight Bearing Restrictions: No    Mobility  Bed Mobility               General bed mobility comments: No purposeful  movement.  Moderate extensor tone overall.  Unable to perform transfers    Transfers                 General transfer comment: unable  Ambulation/Gait             General Gait Details: PROM only   Stairs             Wheelchair Mobility    Modified Rankin (Stroke Patients Only)       Balance       Sitting balance - Comments: unable       Standing balance comment: unable                            Cognition Arousal/Alertness:  (obtunded) Behavior During Therapy:  (obtunded) Overall Cognitive Status: Difficult to assess                                 General Comments: still unresponsive and obtunded, does withdraw to pain/UE touch and stretching.      Exercises      General Comments General comments (skin integrity, edema, etc.): unsafe to participate with bed mobilty- obtunded, extensor tone, elevated vitals with PROM and stretching      Pertinent Vitals/Pain Pain Assessment: Faces Faces Pain Scale: Hurts little more Pain Location: Withdrawls with UE movement Pain Descriptors / Indicators: Grimacing Pain Intervention(s): Limited activity within patient's tolerance;Monitored during session    Home Living  Prior Function            PT Goals (current goals can now be found in the care plan section) Acute Rehab PT Goals Patient Stated Goal: pt unable, non responsive PT Goal Formulation: Patient unable to participate in goal setting Time For Goal Achievement: 09/25/20 Potential to Achieve Goals: Poor Progress towards PT goals: Not progressing toward goals - comment (extensor posturing, obtunded)    Frequency    Min 1X/week      PT Plan Current plan remains appropriate    Co-evaluation              AM-PAC PT "6 Clicks" Mobility   Outcome Measure  Help needed turning from your back to your side while in a flat bed without using bedrails?: Total Help needed moving  from lying on your back to sitting on the side of a flat bed without using bedrails?: Total Help needed moving to and from a bed to a chair (including a wheelchair)?: Total Help needed standing up from a chair using your arms (e.g., wheelchair or bedside chair)?: Total Help needed to walk in hospital room?: Total Help needed climbing 3-5 steps with a railing? : Total 6 Click Score: 6    End of Session Equipment Utilized During Treatment: Oxygen Activity Tolerance: Treatment limited secondary to medical complications (Comment) (elevated vitals with PROM/stretching) Patient left: in bed;with call bell/phone within reach;with bed alarm set;with SCD's reapplied;Other (comment);with family/visitor present Nurse Communication: Mobility status PT Visit Diagnosis: Hemiplegia and hemiparesis;Other symptoms and signs involving the nervous system (R29.898);Other abnormalities of gait and mobility (R26.89) Hemiplegia - caused by: Cerebral infarction;Nontraumatic intracerebral hemorrhage;Other Nontraumatic intracranial hemorrhage     Time: 8099-8338 PT Time Calculation (min) (ACUTE ONLY): 14 min  Charges:  $Therapeutic Exercise: 8-22 mins                     Windell Norfolk, DPT, PN1   Supplemental Physical Therapist [place]    Pager [phone] Acute Rehab Office [phone]

## 2020-09-22 NOTE — Progress Notes (Signed)
MD updated on MEWS score, no new orders, PRN and scheduled medications given.

## 2020-09-23 ENCOUNTER — Inpatient Hospital Stay (HOSPITAL_COMMUNITY): Payer: Medicaid Other

## 2020-09-23 LAB — GLUCOSE, CAPILLARY
Glucose-Capillary: 115 mg/dL — ABNORMAL HIGH (ref 70–99)
Glucose-Capillary: 119 mg/dL — ABNORMAL HIGH (ref 70–99)
Glucose-Capillary: 124 mg/dL — ABNORMAL HIGH (ref 70–99)
Glucose-Capillary: 134 mg/dL — ABNORMAL HIGH (ref 70–99)
Glucose-Capillary: 139 mg/dL — ABNORMAL HIGH (ref 70–99)
Glucose-Capillary: 142 mg/dL — ABNORMAL HIGH (ref 70–99)

## 2020-09-23 LAB — COMPREHENSIVE METABOLIC PANEL
ALT: 74 U/L — ABNORMAL HIGH (ref 0–44)
AST: 79 U/L — ABNORMAL HIGH (ref 15–41)
Albumin: 2.2 g/dL — ABNORMAL LOW (ref 3.5–5.0)
Alkaline Phosphatase: 112 U/L (ref 38–126)
Anion gap: 12 (ref 5–15)
BUN: 19 mg/dL (ref 6–20)
CO2: 25 mmol/L (ref 22–32)
Calcium: 9.1 mg/dL (ref 8.9–10.3)
Chloride: 103 mmol/L (ref 98–111)
Creatinine, Ser: 0.77 mg/dL (ref 0.44–1.00)
GFR, Estimated: >60 mL/min (ref >60)
Glucose, Bld: 114 mg/dL — ABNORMAL HIGH (ref 70–99)
Potassium: 3.7 mmol/L (ref 3.5–5.1)
Sodium: 140 mmol/L (ref 135–145)
Total Bilirubin: 0.4 mg/dL (ref 0.3–1.2)
Total Protein: 6.8 g/dL (ref 6.5–8.1)

## 2020-09-23 LAB — CBC
HCT: 28.6 % — ABNORMAL LOW (ref 36.0–46.0)
Hemoglobin: 9 g/dL — ABNORMAL LOW (ref 12.0–15.0)
MCH: 25.6 pg — ABNORMAL LOW (ref 26.0–34.0)
MCHC: 31.5 g/dL (ref 30.0–36.0)
MCV: 81.5 fL (ref 80.0–100.0)
Platelets: 412 10*3/uL — ABNORMAL HIGH (ref 150–400)
RBC: 3.51 MIL/uL — ABNORMAL LOW (ref 3.87–5.11)
RDW: 17.2 % — ABNORMAL HIGH (ref 11.5–15.5)
WBC: 9.9 10*3/uL (ref 4.0–10.5)
nRBC: 0 % (ref 0.0–0.2)

## 2020-09-23 IMAGING — CT CT HEAD W/O CM
2 series · 15 of 30 positions shown, 17 images · non-contrast
Comparison: Most recent head CT [DATE]

CLINICAL DATA: Delirium.

EXAM:
CT HEAD WITHOUT CONTRAST
TECHNIQUE: Contiguous axial images were obtained from the base of the skull
through the vertex without intravenous contrast.

[Series 3: head without · axial · non-contrast · 0.47mm/px · z∈[-658,-528]mm · 7 of 36 slices shown, 9 images]
[im 5/36  brain]
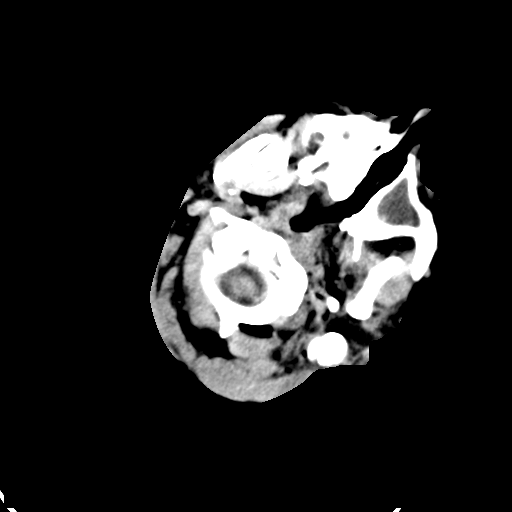
[im 5/36  bone]
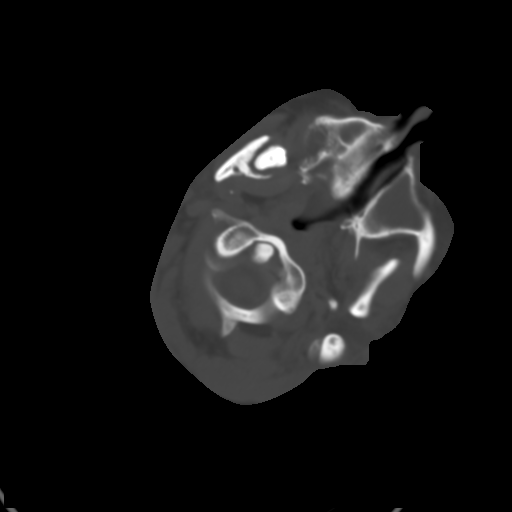
[im 9/36  brain]
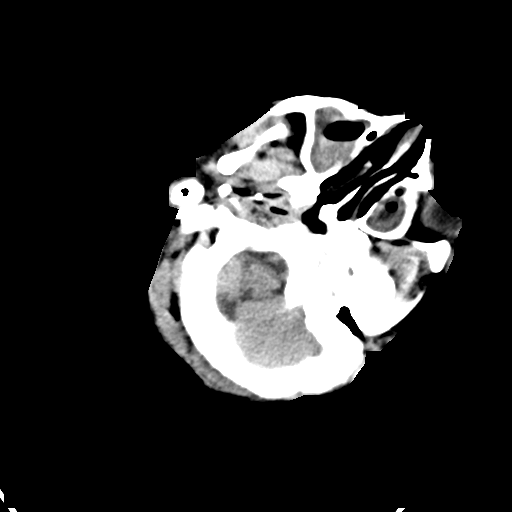
[im 14/36  brain]
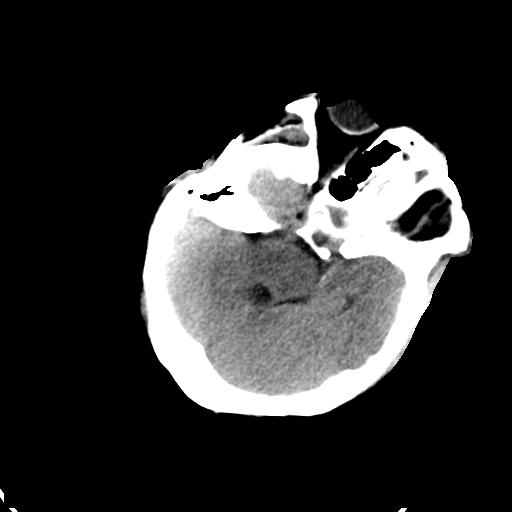
[im 18/36  brain]
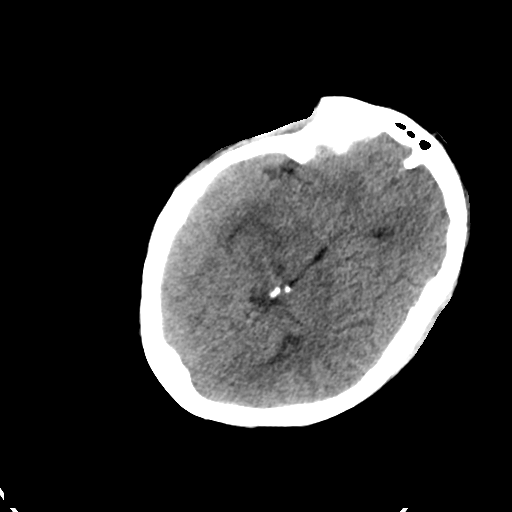
[im 22/36  brain]
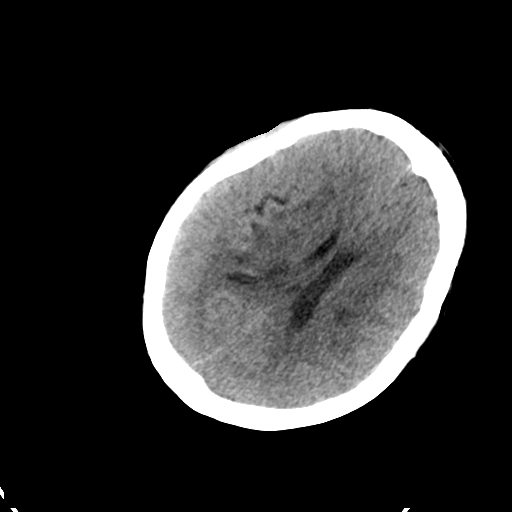
[im 22/36  bone]
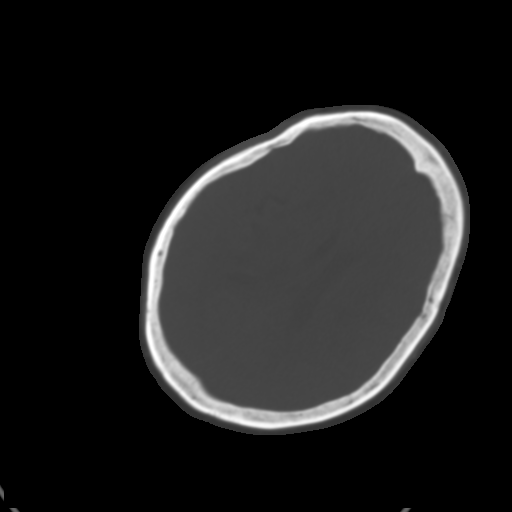
[im 27/36  brain]
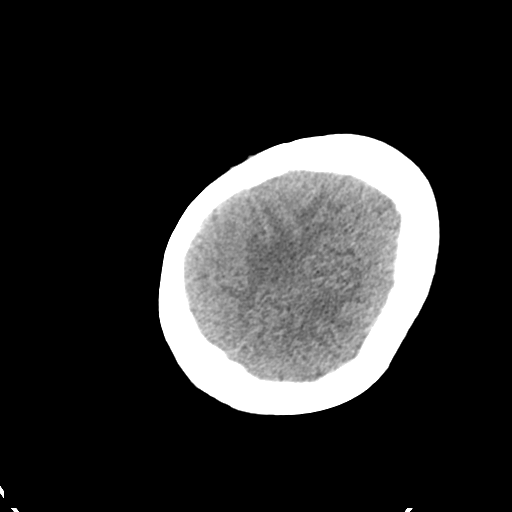
[im 31/36  brain]
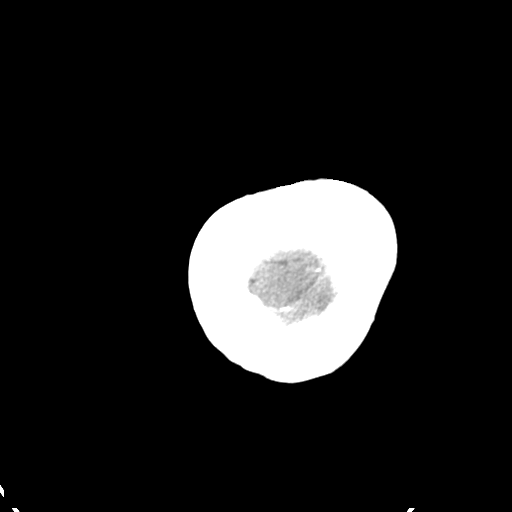

[Series 4: head bone · axial · 0.47mm/px · z∈[-662,-522]mm · 8 of 88 slices shown]
[im 9/88  bone]
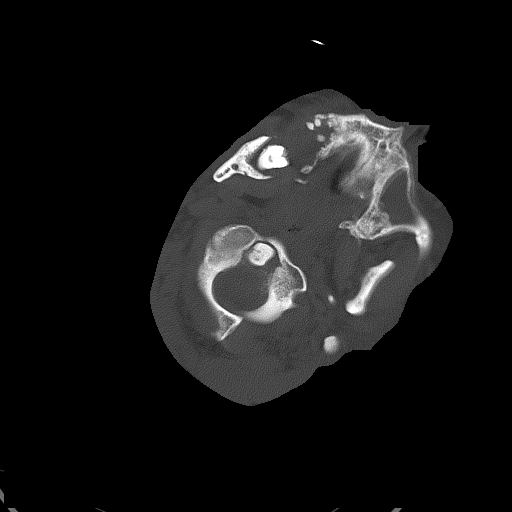
[im 18/88  bone]
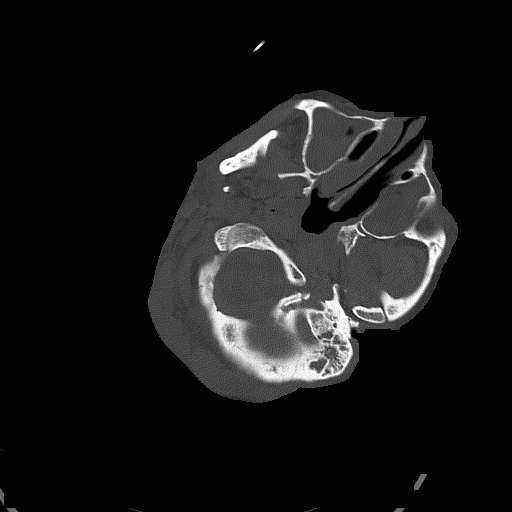
[im 27/88  bone]
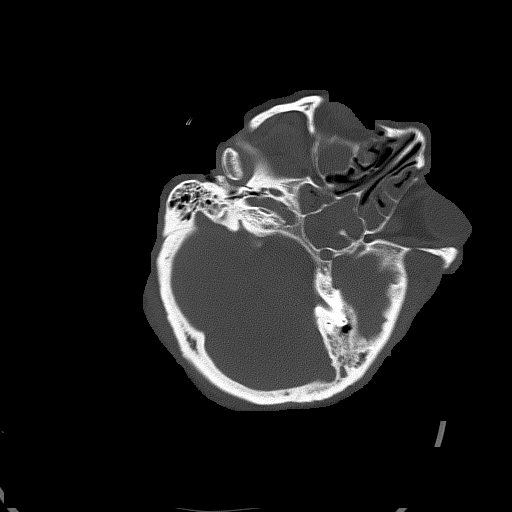
[im 40/88  bone]
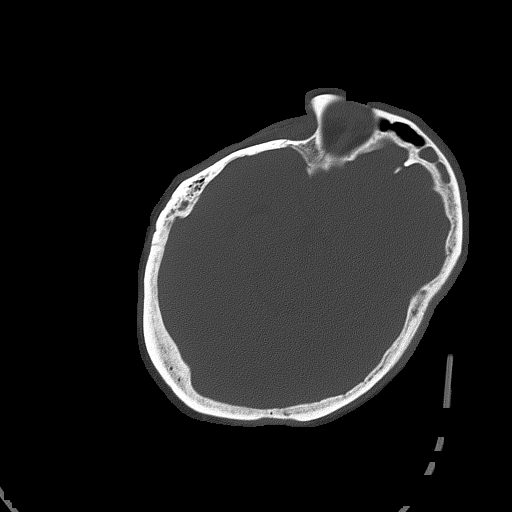
[im 48/88  bone]
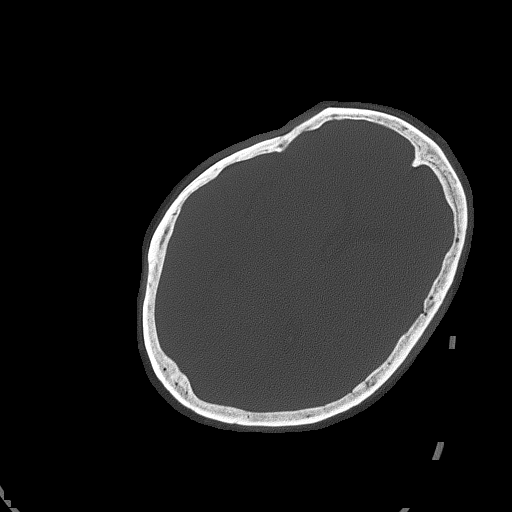
[im 61/88  bone]
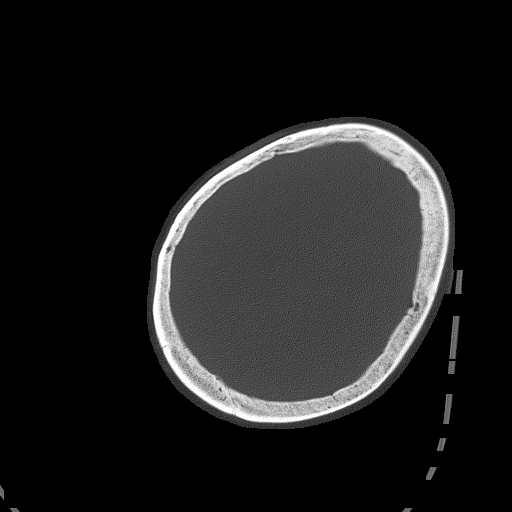
[im 70/88  bone]
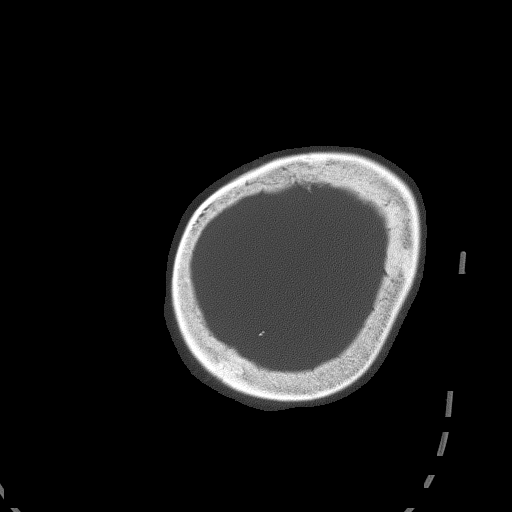
[im 79/88  bone]
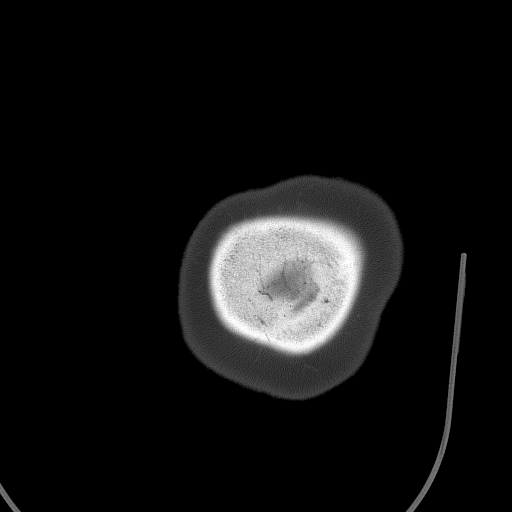

[15 of 30 positions shown; findings below may reference images not displayed]

FINDINGS: Brain: Expected evolution of hemorrhage involving the right
thalamus, basal ganglia and adjacent white matter with minimal
residual high-density components. There is developing
encephalomalacia. Previous intraventricular hemorrhage has resolved.
Persistent sulcal effacement with mild improvement. Persistent 3 mm
left-to-right midline shift. Multiple areas of low attenuation
within the central white matter are unchanged in distribution.
Stable ventricular size from prior. No subdural collection. No
evidence is no infarct or hemorrhage.

Vascular: No hyperdense vessel.

Skull: Prior right frontal ventriculostomy defect. No skull
fracture.

Sinuses/Orbits: Improved aeration of right ethmoid air cells.
Otherwise subtotal opacification of paranasal sinuses. Bubbly
lucency at the base of the right maxillary sinus is likely related
to odontogenic infection. Complete opacification of left mastoid air
cells unchanged. Subtotal opacification of right mastoid air cells.
There is left periorbital soft tissue edema, which was assessed on
MRI earlier today.

Other: None.
IMPRESSION: 1. Expected evolution of hemorrhage involving the right thalamus,
basal ganglia and adjacent white matter from prior exam with
developing encephalomalacia. Previous intraventricular hemorrhage
has resolved. Persistent sulcal effacement with mild improvement.
Persistent 3 mm left-to-right midline shift.
2. Multiple areas of low attenuation consistent with infarcts within
the central white matter are unchanged in distribution from prior.
3. Improved aeration of the right ethmoid air cells. Otherwise
persistent subtotal opacification of paranasal sinuses and mastoid
air cells. Bubbly lucency at the base of the right maxillary sinus
is likely odontogenic in origin.

## 2020-09-23 MED ORDER — METOPROLOL TARTRATE 25 MG/10 ML ORAL SUSPENSION
50.0000 mg | Freq: Two times a day (BID) | ORAL | Status: DC
  Administered 2020-09-24 – 2020-09-25 (×4): 50 mg
  Filled 2020-09-23 (×5): qty 20

## 2020-09-23 NOTE — Progress Notes (Signed)
Nutrition Follow-up  DOCUMENTATION CODES:   Not applicable  INTERVENTION:   Continue Tube feeding via PEG tube: Osmolite 1.2 at 55 ml/h (1320 ml per day) 45 ml ProSource TF daily  Provides 1624 kcal, 84 gm protein, 1070 ml free water daily  100 ml free water every 4 hours    NUTRITION DIAGNOSIS:   Inadequate oral intake related to inability to eat as evidenced by NPO status. Ongoing.   GOAL:   Patient will meet greater than or equal to 90% of their needs Met with TF.   MONITOR:   TF tolerance,Vent status  REASON FOR ASSESSMENT:   Consult,Ventilator Enteral/tube feeding initiation and management  ASSESSMENT:   Pt with PMH of HTN with medication noncompliance admitted with hypertensive thalamic ICH.    2/10 s/p EVD placement and intubation for respiratory arrest 2/13 EVD removed 2/17 palliative care consult; family requesting tx to [facility]  2/24 s/p trach and PEG  3/1 tx to [place] evaluated pt yesterday and noted that pt's mental status continues to be the barrier regarding her trach, unlikely a candidate for decannulation.  CCM plans to change to cuffless trach tomorrow.   Pt continues to be unresponsive. Receiving TF via PEG. Current regimen via PEG: Osmolite 1.2 at 55 ml/h w/ 45 ml ProSource TF daily, 149m free water flushes Q4H. Tolerating well per RN.   UOP: [id number] 24 hours  Admit wt: 61.2 kg Current wt: 67 kg  Medications: ss novolog Q4H, protonix Labs reviewed. CBGs [id number] Diet Order:   Diet Order            Diet NPO time specified  Diet effective midnight                 EDUCATION NEEDS:   No education needs have been identified at this time  Skin:  Skin Assessment: Reviewed RN Assessment  Last BM:  3/8 type 7  Height:   Ht Readings from Last 1 Encounters:  08/27/20 '5\' 5"'  (1.651 m)    Weight:   Wt Readings from Last 1 Encounters:  09/23/20 67 kg    Ideal Body Weight:  56.8 kg  BMI:  Body mass index is 24.58  kg/m.  Estimated Nutritional Needs:   Kcal:  1600-1800  Protein:  80-90 grams  Fluid:  >1.6L/d  AmLarkin InaMS, RD, LDN RD pager number and weekend/on-call pager number located in [facility]

## 2020-09-23 NOTE — Progress Notes (Signed)
PROGRESS NOTE    PAULINE PEGUES  SVX:[id number] DOB: 16-Sep-1965 DOA: 08/26/2020 PCP: Patient, [name]   Chief Complaint  Patient presents with  . Code Stroke   Brief Narrative:  55 yo F with 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 Subsequently on 2/10, she developed obtundation and respiratory arrest leading to intubation.  She has remained persistently obtunded, now has Kenleigh Toback trach and Kal Chait PEG.  Hospital course complicated by persistent fever, Serratia pneumonia and now pre-orbital cellulitis  2/09 Admit for ICH 2/10 EVD placed, later intubated due to obtundation and respiratory arrest 2/11 MRI Brain, hypertonic saline d/c'd, EVD not working. 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 Intraventricular penetration with blood filling the 3rd and forth ventricles and nearly filling the right lateral ventricle. MRI 2/11 with intraparenchymal hemorrhage centered within the right thalamus and basal ganglia extending into the right midbrain with intraventricular extension. Effacement of prepontine cistern and similar 4 mm of leftward midline shift. Separate areas of ristricted diffusion and edema involving the right greater than left mesial temporal lobes / parahippocampal cortex, bilateral basal ganglia, and possibly the upper pons that are concerning for acute infarcts, most  likely 2/2 mass effect from the hemorrhage (see report)/ Per neuro "scattered b/l hippocampus, posterior CC, bilateral thalamus, and upper pons, likely due to deep penetrating vessel compression by focal mass-effect".  2/19 MRI with acute to early subacute infarcts in the bilateral cerebral white matter and right cerebellum, new from 2/11 - interval evolution of multiple other infarcts present on prior MRI, increased hemorrhage along old right EVD tract (see report) - 2/21 head CT with expected evolution of ICH since 2/13 - echo with normal EF, no intracardiac source of embolism - CTA head/neck without intracranial arterial occlusion or high grade stenosis, no dissection, aneurysm, or hemodynamically significant stenosis of the carotid or vertebral arteries - per neurology- see last follow up note from 2/25 - ICH likely 2/2 hypertension and nonadherence to meds - s/p trach/peg on 2/24 - no antithrombotic 2/2 ICH -for obstructive hydrocephalus -> s/p external ventricular catheter placement on 08/27/20 by neurosurgery - EVD clamped on 2/12 and d/c'd on 2/13 -EEG suggestive of cortical dysfunction in right hemisphere likely 2/2 underlying bleed as well as severe diffuse encephalopathy, nonspecific etiology  -only withdraws to painful stimuli - will need to continue goals of care/palliative, family are still hopeful for recovery.  -seizure precautions, aspiration precautions, nutrition, bowel regimen -No significant improvement in her mental status  Hypertensive emergency -Home medications include Coreg and HCTZ -currently on metoprolol (increase dose in setting of tachycardia, elevated BP) -hydral and labetalol prn -SBP goal remains < 160  Sinus Tachycardia Follow on metop, persistent and relatively intermittent, possibly related to intermittent fevers (central fevers) Continue to monitor, workup as indicated Echo with EF 60-65% (see report)  Fever  Serratia Pneumonia  Hospital Acquired  Pneumonia:  -Tracheal aspirate from 2/25 and 3/1 with serratia - cefepime 2/27-3/5 -  There is also concern for central fever, s/p buspirone - being treated for periorbital cellulitis as noted below  Acute hypoxemic respiratory failure secondary to CVA -S/P Trach -continue on trach collar -pulm continuing to follow for trach -Currently on 5 L  Dysphagia -s/p peg on tube feeds, seems to continue to tolerate well  Possible bacterial sinusitis -abx for below  Bacterial Conjunctivitis /periorbital cellulitis - MRI 3/7 orbits with L periorbital cellulitis  - s/p erythromycin ointment - continue ancef   Anasarca -bilateral upper extremity edema, minimal LE edema - suspect related to prolonged hospitalization, hypoalbuminemia -upper extremity US-> age indeterminate SVT of right cephalic vein  Elevated LFT's -mild, continue to monitor overall stable  Leukocytosis -Resolved on antibiotics  Anemia -Stable, follow  DVT prophylaxis: lovenox Code Status: full  Family Communication: daughter in law at bedside Disposition:   Status is: Inpatient  Remains inpatient appropriate because:Inpatient level of care appropriate due to severity of illness   Dispo: The patient is from: Home              Anticipated d/c is to: epnding              Patient currently is not medically stable to d/c.   Difficult to place patient Yes   Consultants:   Palliative care  Pulm  Neurology  Procedures:   Trach/peg EEG IMPRESSION: This study is suggestive of cortical dysfunction in right hemisphere likely secondaru to underlyig bleed as well as severe diffuse encephalopathy, nonspecific etiology. No seizures or epileptiform discharges were seen throughout the recording.  Echo IMPRESSIONS    1. Left ventricular ejection fraction, by estimation, is 60 to 65%. The  left ventricle has normal function. The left ventricle has no regional  wall motion abnormalities. There is mild  concentric left ventricular  hypertrophy. Left ventricular diastolic  parameters were normal.  2. Right ventricular systolic function is normal. The right ventricular  size is normal. There is normal pulmonary artery systolic pressure.  3. The mitral valve is normal in structure. Trivial mitral valve  regurgitation. No evidence of mitral stenosis.  4. The aortic valve is tricuspid. Aortic valve regurgitation is not  visualized. No aortic stenosis is present.  5. The inferior vena cava is normal in size with <50% respiratory  variability, suggesting right atrial pressure of 8 mmHg.   Conclusion(s)/Recommendation(s): Normal biventricular function without  evidence of hemodynamically significant valvular heart disease. No  intracardiac source of embolism detected on this transthoracic study. Argelio Granier  transesophageal echocardiogram is  recommended to exclude cardiac source of embolism if clinically indicated.   Antimicrobials:  Anti-infectives (From admission, onward)   Start     Dose/Rate Route Frequency Ordered Stop   09/21/20 1545  ceFAZolin (ANCEF) IVPB 1 g/50 mL premix        1 g 100 mL/hr over 30 Minutes Intravenous Every 8 hours 09/21/20 1457     09/20/20 0600  ceFAZolin (ANCEF) IVPB 1 g/50 mL premix  Status:  Discontinued        1 g 100 mL/hr over 30 Minutes Intravenous Every 8 hours 09/14/20 0903 09/14/20 0904   09/16/20 1300  vancomycin (VANCOREADY) IVPB 1250 mg/250 mL  Status:  Discontinued        1,250 mg 166.7 mL/hr over 90 Minutes Intravenous Every 24 hours 09/15/20 1232 09/17/20 0956   09/15/20 1230  vancomycin (VANCOREADY) IVPB 1250 mg/250 mL        1,250 mg 166.7 mL/hr over 90 Minutes Intravenous  Once 09/15/20  1131 09/15/20 1402   09/13/20 1400  ceFEPIme (MAXIPIME) 2 g in sodium chloride 0.9 % 100 mL IVPB        2 g 200 mL/hr over 30 Minutes Intravenous Every 8 hours 09/13/20 1341 09/19/20 2208   09/06/20 0600  vancomycin (VANCOREADY) IVPB 1250 mg/250 mL        1,250  mg 166.7 mL/hr over 90 Minutes Intravenous  Once 09/06/20 0541 09/06/20 0739   09/05/20 1400  ceFAZolin (ANCEF) IVPB 1 g/50 mL premix  Status:  Discontinued        1 g 100 mL/hr over 30 Minutes Intravenous Every 8 hours 09/05/20 1120 09/13/20 1341   08/30/20 0600  vancomycin (VANCOREADY) IVPB 500 mg/100 mL  Status:  Discontinued        500 mg 100 mL/hr over 60 Minutes Intravenous Every 12 hours 08/29/20 1554 09/01/20 1115   08/29/20 1700  ceFEPIme (MAXIPIME) 2 g in sodium chloride 0.9 % 100 mL IVPB  Status:  Discontinued        2 g 200 mL/hr over 30 Minutes Intravenous Every 12 hours 08/29/20 1554 09/01/20 1115   08/29/20 1645  vancomycin (VANCOREADY) IVPB 1250 mg/250 mL        1,250 mg 166.7 mL/hr over 90 Minutes Intravenous  Once 08/29/20 1554 08/29/20 1820     Subjective: unresponsive  Objective: Vitals:   09/23/20 1407 09/23/20 1500 09/23/20 1516 09/23/20 1520  BP: 121/79  113/74   Pulse: 99  96 (!) 105  Resp: '17 17 20 16  ' Temp:   98.9 F (37.2 C)   TempSrc:   Axillary   SpO2:   96% 98%  Weight:      Height:        Intake/Output Summary (Last 24 hours) at 09/23/2020 1828 Last data filed at 09/23/2020 1422 Gross per 24 hour  Intake --  Output 750 ml  Net -750 ml   Filed Weights   09/17/20 0500 09/22/20 2334 09/23/20 0418  Weight: 70.8 kg 67.3 kg 67 kg    Examination:  General exam: appears comfortable Edema to L upper eyelid Respiratory system: Clear to auscultation. Respiratory effort normal. Cardiovascular system: S1 & S2 heard, RRR.  Gastrointestinal system: Abdomen is nondistended, soft and nontender.  Central nervous system: unresponsive Extremities: trace eedema     Data Reviewed: I have personally reviewed following labs and imaging studies  CBC: Recent Labs  Lab 09/18/20 0414 09/20/20 0227 09/21/20 0301 09/22/20 0334 09/23/20 0341  WBC 9.9 10.0 9.2 10.4 9.9  HGB 8.4* 8.8* 9.3* 8.9* 9.0*  HCT 27.1* 28.5* 28.6* 28.0* 28.6*  MCV 81.9 81.7  80.1 80.7 81.5  PLT 431* 433* 458* 428* 412*    Basic Metabolic Panel: Recent Labs  Lab 09/17/20 0330 09/18/20 0414 09/20/20 0227 09/21/20 0301 09/22/20 0334 09/23/20 0341  NA 140 138 140 139 139 140  K 4.0 4.1 3.9 4.0 3.9 3.7  CL 106 105 101 101 100 103  CO2 '23 23 27 27 29 25  ' GLUCOSE 135* 142* 118* 140* 119* 114*  BUN 18 20 21* 21* 20 19  CREATININE 0.82 0.83 0.81 0.86 0.80 0.77  CALCIUM 8.5* 8.6* 9.1 9.2 9.2 9.1  MG 2.1 2.0  --   --   --   --   PHOS  --  4.0  --   --   --   --     GFR: Estimated Creatinine Clearance: 73.2 mL/min (by C-G formula based on SCr of 0.77 mg/dL).  Liver Function Tests: Recent Labs  Lab 09/17/20 0330 09/18/20 0414 09/22/20 0334 09/23/20 0341  AST 50* 40 83* 79*  ALT 55* 47* 76* 74*  ALKPHOS 101 90 108 112  BILITOT 0.4 0.4 0.5 0.4  PROT 5.9* 6.2* 6.7 6.8  ALBUMIN 1.7* 1.8* 2.1* 2.2*    CBG: Recent Labs  Lab 09/23/20 0003 09/23/20 0358 09/23/20 0722 09/23/20 1127 09/23/20 1537  GLUCAP 134* 115* 119* 142* 139*     Recent Results (from the past 240 hour(s))  Culture, blood (routine x 2)     Status: None   Collection Time: 09/15/20  8:57 AM   Specimen: BLOOD  Result Value Ref Range Status   Specimen Description BLOOD BLOOD LEFT FOREARM  Final   Special Requests   Final    BOTTLES DRAWN AEROBIC AND ANAEROBIC Blood Culture adequate volume   Culture   Final    NO GROWTH 5 DAYS Performed at [facility], [place] [address], [place], [place] [address]    Report Status 09/20/2020 FINAL  Final  Culture, blood (routine x 2)     Status: None   Collection Time: 09/15/20  9:03 AM   Specimen: BLOOD LEFT HAND  Result Value Ref Range Status   Specimen Description BLOOD LEFT HAND  Final   Special Requests   Final    BOTTLES DRAWN AEROBIC AND ANAEROBIC Blood Culture adequate volume   Culture   Final    NO GROWTH 5 DAYS Performed at [facility], [place] [address] [place], [place] [address]    Report Status 09/20/2020 FINAL   Final  Culture, Respiratory w Gram Stain     Status: None   Collection Time: 09/15/20 11:28 AM   Specimen: Tracheal Aspirate; Respiratory  Result Value Ref Range Status   Specimen Description TRACHEAL ASPIRATE  Final   Special Requests NONE  Final   Gram Stain   Final    ABUNDANT WBC PRESENT,BOTH PMN AND MONONUCLEAR FEW SQUAMOUS EPITHELIAL CELLS PRESENT ABUNDANT GRAM NEGATIVE RODS FEW GRAM POSITIVE COCCI Performed at [facility], [place] [address], [place],  [address]    Culture FEW SERRATIA MARCESCENS  Final   Report Status 09/17/2020 FINAL  Final   Organism ID, Bacteria SERRATIA MARCESCENS  Final      Susceptibility   Serratia marcescens - MIC*    CEFAZOLIN >=64 RESISTANT Resistant     CEFEPIME <=0.12 SENSITIVE Sensitive     CEFTAZIDIME <=1 SENSITIVE Sensitive     CEFTRIAXONE <=0.25 SENSITIVE Sensitive     CIPROFLOXACIN <=0.25 SENSITIVE Sensitive     GENTAMICIN <=1 SENSITIVE Sensitive     TRIMETH/SULFA <=20 SENSITIVE Sensitive     * FEW SERRATIA MARCESCENS         Radiology Studies: No results found.      Scheduled Meds: .  stroke: mapping our early stages of recovery book   Does not apply Once  . atorvastatin  40 mg Per Tube Daily  . chlorhexidine gluconate (MEDLINE KIT)  15 mL Mouth Rinse BID  . Chlorhexidine Gluconate Cloth  6 each Topical Daily  . enoxaparin (LOVENOX) injection  40 mg Subcutaneous Q24H  . feeding supplement (PROSource TF)  45 mL Per Tube Daily  . free water  100 mL Per Tube Q4H  . insulin aspart  0-9 Units Subcutaneous Q4H  . mouth rinse  15 mL Mouth Rinse 10 times per day  . metoprolol tartrate  50 mg Per Tube BID  . pantoprazole  sodium  40 mg Per Tube BID  . scopolamine  1 patch Transdermal Q72H   Continuous Infusions: . sodium chloride 10 mL/hr at 09/22/20 0013  .  ceFAZolin (ANCEF) IV 1 g (09/23/20 1434)  . feeding supplement (OSMOLITE 1.2 CAL) 1,000 mL (09/22/20 1239)     LOS: 27 days    Time spent: over 30  min    Fayrene Helper, MD [facility]   To contact the attending provider between 7A-7P or the covering provider during after hours 7P-7A, please log into the web site [email or web address] and access using universal [place] password for that web site. If you do not have the password, please call the hospital operator.  09/23/2020, 6:28 PM

## 2020-09-24 LAB — COMPREHENSIVE METABOLIC PANEL
ALT: 67 U/L — ABNORMAL HIGH (ref 0–44)
AST: 69 U/L — ABNORMAL HIGH (ref 15–41)
Albumin: 2.2 g/dL — ABNORMAL LOW (ref 3.5–5.0)
Alkaline Phosphatase: 112 U/L (ref 38–126)
Anion gap: 10 (ref 5–15)
BUN: 21 mg/dL — ABNORMAL HIGH (ref 6–20)
CO2: 30 mmol/L (ref 22–32)
Calcium: 9.1 mg/dL (ref 8.9–10.3)
Chloride: 101 mmol/L (ref 98–111)
Creatinine, Ser: 0.82 mg/dL (ref 0.44–1.00)
GFR, Estimated: >60 mL/min (ref >60)
Glucose, Bld: 123 mg/dL — ABNORMAL HIGH (ref 70–99)
Potassium: 3.8 mmol/L (ref 3.5–5.1)
Sodium: 141 mmol/L (ref 135–145)
Total Bilirubin: 0.4 mg/dL (ref 0.3–1.2)
Total Protein: 6.9 g/dL (ref 6.5–8.1)

## 2020-09-24 LAB — CBC WITH DIFFERENTIAL/PLATELET
Abs Immature Granulocytes: 0.06 10*3/uL (ref 0.00–0.07)
Basophils Absolute: 0.1 10*3/uL (ref 0.0–0.1)
Basophils Relative: 1 %
Eosinophils Absolute: 0.2 10*3/uL (ref 0.0–0.5)
Eosinophils Relative: 2 %
HCT: 27.6 % — ABNORMAL LOW (ref 36.0–46.0)
Hemoglobin: 8.9 g/dL — ABNORMAL LOW (ref 12.0–15.0)
Immature Granulocytes: 1 %
Lymphocytes Relative: 20 %
Lymphs Abs: 1.8 10*3/uL (ref 0.7–4.0)
MCH: 26.2 pg (ref 26.0–34.0)
MCHC: 32.2 g/dL (ref 30.0–36.0)
MCV: 81.2 fL (ref 80.0–100.0)
Monocytes Absolute: 0.7 10*3/uL (ref 0.1–1.0)
Monocytes Relative: 8 %
Neutro Abs: 6.2 10*3/uL (ref 1.7–7.7)
Neutrophils Relative %: 68 %
Platelets: 398 10*3/uL (ref 150–400)
RBC: 3.4 MIL/uL — ABNORMAL LOW (ref 3.87–5.11)
RDW: 17.7 % — ABNORMAL HIGH (ref 11.5–15.5)
WBC: 8.9 10*3/uL (ref 4.0–10.5)
nRBC: 0 % (ref 0.0–0.2)

## 2020-09-24 LAB — GLUCOSE, CAPILLARY
Glucose-Capillary: 107 mg/dL — ABNORMAL HIGH (ref 70–99)
Glucose-Capillary: 111 mg/dL — ABNORMAL HIGH (ref 70–99)
Glucose-Capillary: 114 mg/dL — ABNORMAL HIGH (ref 70–99)
Glucose-Capillary: 115 mg/dL — ABNORMAL HIGH (ref 70–99)
Glucose-Capillary: 117 mg/dL — ABNORMAL HIGH (ref 70–99)
Glucose-Capillary: 132 mg/dL — ABNORMAL HIGH (ref 70–99)
Glucose-Capillary: 135 mg/dL — ABNORMAL HIGH (ref 70–99)

## 2020-09-24 LAB — PHOSPHORUS: Phosphorus: 4.7 mg/dL — ABNORMAL HIGH (ref 2.5–4.6)

## 2020-09-24 LAB — MAGNESIUM: Magnesium: 2 mg/dL (ref 1.7–2.4)

## 2020-09-24 MED ORDER — MORPHINE SULFATE (PF) 2 MG/ML IV SOLN: 2.0000 mg | Freq: Four times a day (QID) | INTRAVENOUS | Status: DC | PRN

## 2020-09-24 MED ORDER — MORPHINE SULFATE (PF) 2 MG/ML IV SOLN
1.0000 mg | Freq: Four times a day (QID) | INTRAVENOUS | Status: DC | PRN
  Administered 2020-09-24 – 2020-11-08 (×4): 1 mg via INTRAVENOUS
  Filled 2020-09-24 (×6): qty 1

## 2020-09-24 NOTE — Progress Notes (Signed)
Fairly hypertensive today.  Looks like this has been fairly persistent. Nursing staff working on this. Mom has asked that we wait on trach change. That's reasonable enough as not emergent (just needs to happen prior to dc).  Will see her tomorrow and change trach  Simonne Martinet ACNP-BC [facility] Pulmonary/Critical Care Pager # [phone] OR # [phone] if no answer

## 2020-09-24 NOTE — Progress Notes (Signed)
PROGRESS NOTE    Charlene [name]  QMV:[id number] DOB: 02/19/1966 DOA: 08/26/2020 PCP: Patient, [name]   Chief Complaint  Patient presents with  . Code Stroke   Brief Narrative:  55 yo F with 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 Subsequently on 2/10, she developed obtundation and respiratory arrest leading to intubation.  She has remained persistently obtunded, now has a trach and a PEG.  Hospital course complicated by persistent fever, Serratia pneumonia and now pre-orbital cellulitis  2/09 Admit for ICH 2/10 EVD placed, later intubated due to obtundation and respiratory arrest 2/11 MRI Brain, hypertonic saline d/c'd, EVD not working. 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 Intraventricular penetration with blood filling the 3rd and forth ventricles and nearly filling the right lateral ventricle. MRI 2/11 with intraparenchymal hemorrhage centered within the right thalamus and basal ganglia extending into the right midbrain with intraventricular extension. Effacement of prepontine cistern and similar 4 mm of leftward midline shift. Separate areas of ristricted diffusion and edema involving the right greater than left mesial temporal lobes / parahippocampal cortex, bilateral basal ganglia, and possibly the upper pons that are concerning for acute infarcts, most  likely 2/2 mass effect from the hemorrhage (see report)/ Per neuro "scattered b/l hippocampus, posterior CC, bilateral thalamus, and upper pons, likely due to deep penetrating vessel compression by focal mass-effect".  2/19 MRI with acute to early subacute infarcts in the bilateral cerebral white matter and right cerebellum, new from 2/11 - interval evolution of multiple other infarcts present on prior MRI, increased hemorrhage along old right EVD tract (see report) - 2/21 head CT with expected evolution of ICH since 2/13 - echo with normal EF, no intracardiac source of embolism - CTA head/neck without intracranial arterial occlusion or high grade stenosis, no dissection, aneurysm, or hemodynamically significant stenosis of the carotid or vertebral arteries - head CT 3/9 with expected evolution of hemorrhage involving the right thalamus, basal ganglia, and adjacent white matter from prior exam with developing encephalomalacia.  Resolved previous intraventricular hemorrhage.  Persistent sulcal effacement with mild improvement.  Persistent 3 mm left to right midline shift.  Multiple areas of low attenuation consistent with infarcts within the central white matter, unchanged in distribution from prior.  (see report) - per neurology- see last follow up note from 2/25 - [place] likely 2/2 hypertension and nonadherence to meds - s/p trach/peg on 2/24 - no antithrombotic 2/2 ICH -for obstructive hydrocephalus -> s/p external ventricular catheter placement on 08/27/20 by neurosurgery - EVD clamped on 2/12 and d/c'd on 2/13 -EEG suggestive of cortical dysfunction in right hemisphere likely 2/2 underlying bleed as well as severe diffuse encephalopathy, nonspecific etiology  -only withdraws to painful stimuli - will need to continue goals of care/palliative, family are still hopeful for recovery.  -seizure precautions, aspiration precautions, nutrition, bowel regimen -No significant improvement in her mental  status  Hypertensive emergency -Home medications include Coreg and HCTZ -currently on  metoprolol (increase dose in setting of tachycardia, elevated BP) -hydral and labetalol prn -SBP goal remains < 160  Sinus Tachycardia Follow on metop, persistent and relatively intermittent, possibly related to intermittent fevers (central fevers) Continue to monitor, workup as indicated Echo with EF 60-65% (see report)  Concern for pain/discomfort Mother noted concern for pain, discomfort -> this is possible and could lead to increased BP/HR.  Morphine ordered to try prn, though family has declined this per discussion with nursing.   Continue apap, prn  Fever  Serratia Pneumonia  Hospital Acquired Pneumonia:  -Tracheal aspirate from 2/25 and 3/1 with serratia - cefepime 2/27-3/5 -There is also concern for central fever, s/p buspirone - being treated for periorbital cellulitis as noted below  Acute hypoxemic respiratory failure secondary to CVA -S/P Trach -continue on trach collar -pulm continuing to follow for trach -> planning to change to cuffless trach, likely 3/11 -Currently on 5 L  Dysphagia -s/p peg on tube feeds, seems to continue to tolerate well  Possible bacterial sinusitis -abx for below - CT head 3/9 with persistent subtotal opacification of paranasal sinuses and mastoid air cells, bubbly lucency at base of R maxillary sinus (likely odontogenic in origin)  Bacterial Conjunctivitis /periorbital cellulitis - MRI 3/7 orbits with L periorbital cellulitis  - s/p erythromycin ointment - continue ancef   Anasarca -bilateral upper extremity edema, minimal LE edema - suspect related to prolonged hospitalization, hypoalbuminemia -upper extremity US-> age indeterminate SVT of right cephalic vein  Elevated LFT's -mild, continue to monitor overall stable  Leukocytosis -Resolved on antibiotics  Anemia -Stable, follow  DVT prophylaxis: lovenox Code Status: full   Family Communication: mother at bedside Disposition:   Status is: Inpatient  Remains inpatient appropriate because:Inpatient level of care appropriate due to severity of illness   Dispo: The patient is from: Home              Anticipated d/c is to: epnding              Patient currently is not medically stable to d/c.   Difficult to place patient Yes   Consultants:   Palliative care  Pulm  Neurology  Procedures:   Trach/peg EEG IMPRESSION: This study is suggestive of cortical dysfunction in right hemisphere likely secondaru to underlyig bleed as well as severe diffuse encephalopathy, nonspecific etiology. No seizures or epileptiform discharges were seen throughout the recording.  Echo IMPRESSIONS    1. Left ventricular ejection fraction, by estimation, is 60 to 65%. The  left ventricle has normal function. The left ventricle has no regional  wall motion abnormalities. There is mild concentric left ventricular  hypertrophy. Left ventricular diastolic  parameters were normal.  2. Right ventricular systolic function is normal. The right ventricular  size is normal. There is normal pulmonary artery systolic pressure.  3. The mitral valve is normal in structure. Trivial mitral valve  regurgitation. No evidence of mitral stenosis.  4. The aortic valve is tricuspid. Aortic valve regurgitation is not  visualized. No aortic stenosis is present.  5. The inferior vena cava is normal in size with <50% respiratory  variability, suggesting right atrial pressure of 8 mmHg.   Conclusion(s)/Recommendation(s): Normal biventricular function without  evidence of hemodynamically significant valvular heart disease. No  intracardiac source of embolism detected on this transthoracic study. A  transesophageal echocardiogram is  recommended to exclude cardiac source of embolism if clinically indicated.   Antimicrobials:  Anti-infectives (From admission, onward)   Start  Dose/Rate Route Frequency Ordered Stop   09/21/20 1545  ceFAZolin (ANCEF) IVPB 1 g/50 mL premix        1 g 100 mL/hr over 30 Minutes Intravenous Every 8 hours 09/21/20 1457     09/20/20 0600  ceFAZolin (ANCEF) IVPB 1 g/50 mL premix  Status:  Discontinued        1 g 100 mL/hr over 30 Minutes Intravenous Every 8 hours 09/14/20 0903 09/14/20 0904   09/16/20 1300  vancomycin (VANCOREADY) IVPB 1250 mg/250 mL  Status:  Discontinued        1,250 mg 166.7 mL/hr over 90 Minutes Intravenous Every 24 hours 09/15/20 1232 09/17/20 0956   09/15/20 1230  vancomycin (VANCOREADY) IVPB 1250 mg/250 mL        1,250 mg 166.7 mL/hr over 90 Minutes Intravenous  Once 09/15/20 1131 09/15/20 1402   09/13/20 1400  ceFEPIme (MAXIPIME) 2 g in sodium chloride 0.9 % 100 mL IVPB        2 g 200 mL/hr over 30 Minutes Intravenous Every 8 hours 09/13/20 1341 09/19/20 2208   09/06/20 0600  vancomycin (VANCOREADY) IVPB 1250 mg/250 mL        1,250 mg 166.7 mL/hr over 90 Minutes Intravenous  Once 09/06/20 0541 09/06/20 0739   09/05/20 1400  ceFAZolin (ANCEF) IVPB 1 g/50 mL premix  Status:  Discontinued        1 g 100 mL/hr over 30 Minutes Intravenous Every 8 hours 09/05/20 1120 09/13/20 1341   08/30/20 0600  vancomycin (VANCOREADY) IVPB 500 mg/100 mL  Status:  Discontinued        500 mg 100 mL/hr over 60 Minutes Intravenous Every 12 hours 08/29/20 1554 09/01/20 1115   08/29/20 1700  ceFEPIme (MAXIPIME) 2 g in sodium chloride 0.9 % 100 mL IVPB  Status:  Discontinued        2 g 200 mL/hr over 30 Minutes Intravenous Every 12 hours 08/29/20 1554 09/01/20 1115   08/29/20 1645  vancomycin (VANCOREADY) IVPB 1250 mg/250 mL        1,250 mg 166.7 mL/hr over 90 Minutes Intravenous  Once 08/29/20 1554 08/29/20 1820     Subjective: unresponsive  Objective: Vitals:   09/24/20 0818 09/24/20 0820 09/24/20 0901 09/24/20 1123  BP:      Pulse: (!) 113   93  Resp: '19 18 18 ' (!) 22  Temp:      TempSrc:      SpO2: 97%   97%  Weight:       Height:        Intake/Output Summary (Last 24 hours) at 09/24/2020 1405 Last data filed at 09/23/2020 1422 Gross per 24 hour  Intake -  Output 250 ml  Net -250 ml   Filed Weights   09/22/20 2334 09/23/20 0418 09/24/20 0010  Weight: 67.3 kg 67 kg 60.5 kg    Examination:  General: No acute distress. Periorbital swelling on L  Cardiovascular: Heart sounds show a regular rate, and rhythm. Lungs: Clear to auscultation bilaterally Abdomen: Soft, nontender, nondistended  Neurological: withdraws lower extremities from painful stimuli, moves RUE spontaneously, didn't withdraw from nailbed pressure to LUE today - though exam fluctuated yesterday to today (yesterday, was moving RLE spontaneously and didn't seem to withdraw sig from pain).   Data Reviewed: I have personally reviewed following labs and imaging studies  CBC: Recent Labs  Lab 09/20/20 0227 09/21/20 0301 09/22/20 0334 09/23/20 0341 09/24/20 0343  WBC 10.0 9.2 10.4 9.9 8.9  NEUTROABS  --   --   --   --  6.2  HGB 8.8* 9.3* 8.9* 9.0* 8.9*  HCT 28.5* 28.6* 28.0* 28.6* 27.6*  MCV 81.7 80.1 80.7 81.5 81.2  PLT 433* 458* 428* 412* 734    Basic Metabolic Panel: Recent Labs  Lab 09/18/20 0414 09/20/20 0227 09/21/20 0301 09/22/20 0334 09/23/20 0341 09/24/20 0343  NA 138 140 139 139 140 141  K 4.1 3.9 4.0 3.9 3.7 3.8  CL 105 101 101 100 103 101  CO2 '23 27 27 29 25 30  ' GLUCOSE 142* 118* 140* 119* 114* 123*  BUN 20 21* 21* 20 19 21*  CREATININE 0.83 0.81 0.86 0.80 0.77 0.82  CALCIUM 8.6* 9.1 9.2 9.2 9.1 9.1  MG 2.0  --   --   --   --  2.0  PHOS 4.0  --   --   --   --  4.7*    GFR: Estimated Creatinine Clearance: 71.4 mL/min (by C-G formula based on SCr of 0.82 mg/dL).  Liver Function Tests: Recent Labs  Lab 09/18/20 0414 09/22/20 0334 09/23/20 0341 09/24/20 0343  AST 40 83* 79* 69*  ALT 47* 76* 74* 67*  ALKPHOS 90 108 112 112  BILITOT 0.4 0.5 0.4 0.4  PROT 6.2* 6.7 6.8 6.9  ALBUMIN 1.8* 2.1* 2.2*  2.2*    CBG: Recent Labs  Lab 09/23/20 1959 09/24/20 0014 09/24/20 0410 09/24/20 0737 09/24/20 1119  GLUCAP 124* 117* 111* 114* 107*     Recent Results (from the past 240 hour(s))  Culture, blood (routine x 2)     Status: None   Collection Time: 09/15/20  8:57 AM   Specimen: BLOOD  Result Value Ref Range Status   Specimen Description BLOOD BLOOD LEFT FOREARM  Final   Special Requests   Final    BOTTLES DRAWN AEROBIC AND ANAEROBIC Blood Culture adequate volume   Culture   Final    NO GROWTH 5 DAYS Performed at [facility], [place] [address], [place], [place] [address]    Report Status 09/20/2020 FINAL  Final  Culture, blood (routine x 2)     Status: None   Collection Time: 09/15/20  9:03 AM   Specimen: BLOOD LEFT HAND  Result Value Ref Range Status   Specimen Description BLOOD LEFT HAND  Final   Special Requests   Final    BOTTLES DRAWN AEROBIC AND ANAEROBIC Blood Culture adequate volume   Culture   Final    NO GROWTH 5 DAYS Performed at [facility], [place] [address] [place], [place] [address]    Report Status 09/20/2020 FINAL  Final  Culture, Respiratory w Gram Stain     Status: None   Collection Time: 09/15/20 11:28 AM   Specimen: Tracheal Aspirate; Respiratory  Result Value Ref Range Status   Specimen Description TRACHEAL ASPIRATE  Final   Special Requests NONE  Final   Gram Stain   Final    ABUNDANT WBC PRESENT,BOTH PMN AND MONONUCLEAR FEW SQUAMOUS EPITHELIAL CELLS PRESENT ABUNDANT GRAM NEGATIVE RODS FEW GRAM POSITIVE COCCI Performed at [facility], [place] [address], [place], [place] [address]    Culture FEW SERRATIA MARCESCENS  Final   Report Status 09/17/2020 FINAL  Final   Organism ID, Bacteria SERRATIA MARCESCENS  Final      Susceptibility   Serratia marcescens - MIC*    CEFAZOLIN >=64 RESISTANT Resistant     CEFEPIME <=0.12 SENSITIVE Sensitive     CEFTAZIDIME <=1 SENSITIVE Sensitive     CEFTRIAXONE <=0.25 SENSITIVE Sensitive  CIPROFLOXACIN <=0.25 SENSITIVE Sensitive     GENTAMICIN <=1 SENSITIVE Sensitive     TRIMETH/SULFA <=20 SENSITIVE Sensitive     * FEW SERRATIA MARCESCENS         Radiology Studies: CT HEAD WO CONTRAST  Result Date: 09/23/2020 CLINICAL DATA:  Delirium. EXAM: CT HEAD WITHOUT CONTRAST TECHNIQUE: Contiguous axial images were obtained from the base of the skull through the vertex without intravenous contrast. COMPARISON:  Most recent head CT 09/07/2020 FINDINGS: Brain: Expected evolution of hemorrhage involving the right thalamus, basal ganglia and adjacent white matter with minimal residual high-density components. There is developing encephalomalacia. Previous intraventricular hemorrhage has resolved. Persistent sulcal effacement with mild improvement. Persistent 3 mm left-to-right midline shift. Multiple areas of low attenuation within the central white matter are unchanged in distribution. Stable ventricular size from prior. No subdural collection. No evidence is no infarct or hemorrhage. Vascular: No hyperdense vessel. Skull: Prior right frontal ventriculostomy defect. No skull fracture. Sinuses/Orbits: Improved aeration of right ethmoid air cells. Otherwise subtotal opacification of paranasal sinuses. Bubbly lucency at the base of the right maxillary sinus is likely related to odontogenic infection. Complete opacification of left mastoid air cells unchanged. Subtotal opacification of right mastoid air cells. There is left periorbital soft tissue edema, which was assessed on MRI earlier today. Other: None. IMPRESSION: 1. Expected evolution of hemorrhage involving the right thalamus, basal ganglia and adjacent white matter from prior exam with developing encephalomalacia. Previous intraventricular hemorrhage has resolved. Persistent sulcal effacement with mild improvement. Persistent 3 mm left-to-right midline shift. 2. Multiple areas of low attenuation consistent with infarcts within the central white  matter are unchanged in distribution from prior. 3. Improved aeration of the right ethmoid air cells. Otherwise persistent subtotal opacification of paranasal sinuses and mastoid air cells. Bubbly lucency at the base of the right maxillary sinus is likely odontogenic in origin. Electronically Signed   By: Keith Rake M.D.   On: 09/23/2020 22:48        Scheduled Meds: .  stroke: mapping our early stages of recovery book   Does not apply Once  . atorvastatin  40 mg Per Tube Daily  . chlorhexidine gluconate (MEDLINE KIT)  15 mL Mouth Rinse BID  . Chlorhexidine Gluconate Cloth  6 each Topical Daily  . enoxaparin (LOVENOX) injection  40 mg Subcutaneous Q24H  . feeding supplement (PROSource TF)  45 mL Per Tube Daily  . free water  100 mL Per Tube Q4H  . insulin aspart  0-9 Units Subcutaneous Q4H  . mouth rinse  15 mL Mouth Rinse 10 times per day  . metoprolol tartrate  50 mg Per Tube BID  . pantoprazole sodium  40 mg Per Tube BID  . scopolamine  1 patch Transdermal Q72H   Continuous Infusions: . sodium chloride 10 mL/hr at 09/22/20 0013  .  ceFAZolin (ANCEF) IV 1 g (09/24/20 7493)  . feeding supplement (OSMOLITE 1.2 CAL) 1,000 mL (09/24/20 0342)     LOS: 28 days    Time spent: over 30 min    Fayrene Helper, MD [facility]   To contact the attending provider between 7A-7P or the covering provider during after hours 7P-7A, please log into the web site [email or web address] and access using universal [place] password for that web site. If you do not have the password, please call the hospital operator.  09/24/2020, 2:05 PM

## 2020-09-25 ENCOUNTER — Ambulatory Visit: Payer: Self-pay | Admitting: Physician Assistant

## 2020-09-25 DIAGNOSIS — Z515 Encounter for palliative care: Secondary | ICD-10-CM | POA: Insufficient documentation

## 2020-09-25 DIAGNOSIS — R52 Pain, unspecified: Secondary | ICD-10-CM

## 2020-09-25 LAB — COMPREHENSIVE METABOLIC PANEL
ALT: 58 U/L — ABNORMAL HIGH (ref 0–44)
AST: 61 U/L — ABNORMAL HIGH (ref 15–41)
Albumin: 2.2 g/dL — ABNORMAL LOW (ref 3.5–5.0)
Alkaline Phosphatase: 98 U/L (ref 38–126)
Anion gap: 11 (ref 5–15)
BUN: 20 mg/dL (ref 6–20)
CO2: 27 mmol/L (ref 22–32)
Calcium: 8.8 mg/dL — ABNORMAL LOW (ref 8.9–10.3)
Chloride: 101 mmol/L (ref 98–111)
Creatinine, Ser: 0.73 mg/dL (ref 0.44–1.00)
GFR, Estimated: >60 mL/min (ref >60)
Glucose, Bld: 120 mg/dL — ABNORMAL HIGH (ref 70–99)
Potassium: 3.8 mmol/L (ref 3.5–5.1)
Sodium: 139 mmol/L (ref 135–145)
Total Bilirubin: 0.6 mg/dL (ref 0.3–1.2)
Total Protein: 6.5 g/dL (ref 6.5–8.1)

## 2020-09-25 LAB — CBC WITH DIFFERENTIAL/PLATELET
Abs Immature Granulocytes: 0.04 10*3/uL (ref 0.00–0.07)
Basophils Absolute: 0 10*3/uL (ref 0.0–0.1)
Basophils Relative: 1 %
Eosinophils Absolute: 0.2 10*3/uL (ref 0.0–0.5)
Eosinophils Relative: 2 %
HCT: 26.2 % — ABNORMAL LOW (ref 36.0–46.0)
Hemoglobin: 8.1 g/dL — ABNORMAL LOW (ref 12.0–15.0)
Immature Granulocytes: 1 %
Lymphocytes Relative: 24 %
Lymphs Abs: 1.6 10*3/uL (ref 0.7–4.0)
MCH: 25.5 pg — ABNORMAL LOW (ref 26.0–34.0)
MCHC: 30.9 g/dL (ref 30.0–36.0)
MCV: 82.4 fL (ref 80.0–100.0)
Monocytes Absolute: 0.7 10*3/uL (ref 0.1–1.0)
Monocytes Relative: 9 %
Neutro Abs: 4.4 10*3/uL (ref 1.7–7.7)
Neutrophils Relative %: 63 %
Platelets: 347 10*3/uL (ref 150–400)
RBC: 3.18 MIL/uL — ABNORMAL LOW (ref 3.87–5.11)
RDW: 17.5 % — ABNORMAL HIGH (ref 11.5–15.5)
WBC: 6.9 10*3/uL (ref 4.0–10.5)
nRBC: 0 % (ref 0.0–0.2)

## 2020-09-25 LAB — GLUCOSE, CAPILLARY
Glucose-Capillary: 108 mg/dL — ABNORMAL HIGH (ref 70–99)
Glucose-Capillary: 116 mg/dL — ABNORMAL HIGH (ref 70–99)
Glucose-Capillary: 120 mg/dL — ABNORMAL HIGH (ref 70–99)
Glucose-Capillary: 128 mg/dL — ABNORMAL HIGH (ref 70–99)
Glucose-Capillary: 134 mg/dL — ABNORMAL HIGH (ref 70–99)
Glucose-Capillary: 154 mg/dL — ABNORMAL HIGH (ref 70–99)

## 2020-09-25 LAB — PHOSPHORUS: Phosphorus: 4.9 mg/dL — ABNORMAL HIGH (ref 2.5–4.6)

## 2020-09-25 LAB — MAGNESIUM: Magnesium: 1.9 mg/dL (ref 1.7–2.4)

## 2020-09-25 MED ORDER — METOPROLOL TARTRATE 25 MG/10 ML ORAL SUSPENSION
75.0000 mg | Freq: Two times a day (BID) | ORAL | Status: DC
  Administered 2020-09-25 – 2020-09-27 (×5): 75 mg
  Filled 2020-09-25 (×7): qty 30

## 2020-09-25 MED ORDER — HYDRALAZINE HCL 10 MG PO TABS: 10.0000 mg | ORAL_TABLET | Freq: Three times a day (TID) | ORAL | Status: DC

## 2020-09-25 MED ORDER — HYDRALAZINE HCL 25 MG PO TABS: 25.0000 mg | ORAL_TABLET | Freq: Three times a day (TID) | ORAL | Status: DC

## 2020-09-25 MED ORDER — BACLOFEN 10 MG PO TABS
5.0000 mg | ORAL_TABLET | Freq: Every day | ORAL | Status: DC
  Administered 2020-09-25 – 2020-09-28 (×4): 5 mg
  Filled 2020-09-25 (×4): qty 1

## 2020-09-25 MED ORDER — HYDRALAZINE HCL 25 MG PO TABS
25.0000 mg | ORAL_TABLET | Freq: Three times a day (TID) | ORAL | Status: DC
  Administered 2020-09-26 – 2020-09-27 (×5): 25 mg
  Filled 2020-09-25 (×6): qty 1

## 2020-09-25 NOTE — Progress Notes (Signed)
NAME:  Charlene [name], MRN:  [id number], DOB:  1965-09-22, LOS: 29 ADMISSION DATE:  08/26/2020, CONSULTATION DATE:  2/10 REFERRING MD:  Dr. Roda Shutters, CHIEF COMPLAINT:  ICH   Brief History:  55 year old female admitted with hypertensive R thalamic/basal ganglia ICH. Resultant obstructive hydrocephalus prompting EVD placement 2/10. Subsequently, 2/10 she had acute mental status change to obtundation and respiratory arrest resulting in intubation.  Past Medical History:  HTN, medication noncompliance  Significant Hospital Events:  2/09 Admit for ICH 2/10 EVD placed, later intubated due to obtundation and respiratory arrest 2/11 MRI Brain, hypertonic saline d/c'd, EVD not working.  Husband did not consent to PICC  2/13 EVD removed 2/17 [place] discussion with family, PMT  Consults:  Stroke PCCM Neurosurgery  Procedures:  ETT 2/10 >> EVD 2/10 > non functioning 2/11 at 1500; clamped 2/12 am > 2/13 Trach and diagnostic Bronchoscopy 2/24>> Dr. Denese Killings PEG placement 2/24>> Dr. Sheliah Hatch  Significant Diagnostic Tests:  CT Head 2/9 >> Acute hemorrhage with the epicenter in the right thalamus. Measurement is approximately 3.2 x 2.1 x 2.5 cm (volume 8.8 cm^3). Intraventricular penetration with blood filling the third and fourth ventricles and nearly filling the right lateral ventricle. No hydrocephalus. CTA Head/Neck 2/10 >> Unchanged size of intraparenchymal hemorrhage centered in the right thalamus and basal ganglia with extension into the right lateral ventricle. 2. No intracranial arterial occlusion or high-grade stenosis. No dissection, aneurysm or hemodynamically significant stenosis of the carotid or vertebral arteries. CT Head 2/10 >> Stable parenchymal hemorrhage in the right thalamus and basal ganglia with intraventricular extension as described. No new focal abnormality is noted. CT Head 2/10 (post intubation) >> redemonstrated intraparenchymal hemorrhage centered within the R thalamus/basal  ganglia extending into the cerebral peduncle/midbrain with associated intraventricular extension and hemorrhage in the R lateral ventricle and fourth ventricle, overall not significantly changed from prior; stable mass effect, 44mm of R to L midline shift, postsurgical changes of ventriculostomy TTE 2/10 >> LVEF 60 to 65%, LV has normal function, no regional wall motion abnormalities, mild concentric LVH.  RV systolic function is normal, normal pulmonary artery systolic pressure.  MRA head 2/11 >>Negative intracranial MR angiography. No evidence of aneurysm or high flow vascular malformation to explain the intraparenchymal hemorrhage  MRI brain 2/11 >> Similar intraparenchymal hemorrhage centered within the right thalamus and basal ganglia extending into the right midbrain with intraventricular extension, effacement of the prepontine cistern and similar 4 mm of leftward midline shift; separate areas of restricted diffusion and edema involving the R > L mesial temporal lobes/parahippocampal cortex, bilateral basal ganglia, and possibly the upper pons that are c/f acute infarcts, most likely secondary to mass effect from hemorrhage. MRI brain 2/19 >> intraventricular clot has decreased in size but mid-brain parenchymal clot has not and there are more ischemic infarcts in the surrounding brain.  CT Head WO Contrast 09/07/20>> Expected evolution of intracranial hemorrhage since 08/30/2020 head CT. Multiple areas of low attenuation corresponding to some of the infarcts seen on prior MRI. Ventricle caliber similar to prior MRI   Micro Data:  2/9 COVID + Influenza negative 2/10 MRSA PCR negative 2/12 BCx 2 > no growth  2/19 BC 1/2 +Stap EPI, 1/2 no growth   Antimicrobials:  Cefepime 2/12 > 2/15 Vanc 2/12 > 2/15  Interim History / Subjective:  No distress   Objective   Blood pressure 140/85, pulse 87, temperature 98.8 F (37.1 C), temperature source Axillary, resp. rate 19, height 5\' 5"  (1.651 m),  weight 66.5 kg,  last menstrual period 03/03/2014, SpO2 98 %, unknown if currently breastfeeding.    FiO2 (%):  [21 %] 21 %   Intake/Output Summary (Last 24 hours) at 09/25/2020 1302 Last data filed at 09/25/2020 2992 Gross per 24 hour  Intake 589.59 ml  Output 400 ml  Net 189.59 ml   Filed Weights   09/23/20 0418 09/24/20 0010 09/25/20 0353  Weight: 67 kg 60.5 kg 66.5 kg   Examination: General 55 year old female lying in bed HENT Her head/neck is contracted towards the left. The trach stoma itself is intact. 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 Doubt she will be candidate for decannulation. I spoke w/ Dr Lowell Guitar about the neck contracture. I worry if this persists that it may affect her trach as well as it is pulling the airway more leftward.   Plan/rec Cont routine trach care Change trach every 12 weeks   Simonne Martinet ACNP-BC [facility] Pulmonary/Critical Care Pager # [phone] OR # [phone] if no answer

## 2020-09-25 NOTE — Procedures (Signed)
Tracheostomy Exchange Procedure Note  Charlene [name]  [id number]  1966-06-26  Date:09/25/20  Time:3:08 PM   Provider Performing:Pete E Tanja Port   Procedure: Tracheostomy Exchange Through Immature Stoma (93716)  Indication(s) Removing cuffed trach   Consent Risks of the procedure as well as the alternatives and risks of each were explained to the patient and/or caregiver.  Consent for the procedure was obtained and is signed in the bedside chart  Anesthesia None   Time Out Verified patient identification, verified procedure, site/side was marked, verified correct patient position, special equipment/implants available, medications/allergies/relevant history reviewed, required imaging and test results available.   Sterile Technique Hand hygiene, gloves   Procedure Description Size 6 cuffed existing Shiley removed and size 6 uncuffed Shiley placed through stoma.   Complications/Tolerance None; patient tolerated the procedure well..   EBL Minimal Simonne Martinet ACNP-BC [facility] Pulmonary/Critical Care Pager # [phone] OR # [phone] if no answer

## 2020-09-25 NOTE — Progress Notes (Signed)
PROGRESS NOTE    Charlene [name]  UDJ:[id number] DOB: 05-Feb-1966 DOA: 08/26/2020 PCP: Patient, [name]   Chief Complaint  Patient presents with  . Code Stroke   Brief Narrative:  55 yo F with 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 Subsequently on 2/10, she developed obtundation and respiratory arrest leading to intubation.  She has remained persistently obtunded, now has Akita Maxim trach and Jhayden Demuro PEG.  Hospital course complicated by persistent fever, Serratia pneumonia and now pre-orbital cellulitis  2/09 Admit for ICH 2/10 EVD placed, later intubated due to obtundation and respiratory arrest 2/11 MRI Brain, hypertonic saline d/c'd, EVD not working. 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 Intraventricular penetration with blood filling the 3rd and forth ventricles and nearly filling the right lateral ventricle. MRI 2/11 with intraparenchymal hemorrhage centered within the right thalamus and basal ganglia extending into the right midbrain with intraventricular extension. Effacement of prepontine cistern and similar 4 mm of leftward midline shift. Separate areas of ristricted diffusion and edema involving the right greater than left mesial temporal lobes / parahippocampal cortex, bilateral basal ganglia, and possibly the upper pons that are  concerning for acute infarcts, most likely 2/2 mass effect from the hemorrhage (see report)/ Per neuro "scattered b/l hippocampus, posterior CC, bilateral thalamus, and upper pons, likely due to deep penetrating vessel compression by focal mass-effect".  2/19 MRI with acute to early subacute infarcts in the bilateral cerebral white matter and right cerebellum, new from 2/11 - interval evolution of multiple other infarcts present on prior MRI, increased hemorrhage along old right EVD tract (see report) - 2/21 head CT with expected evolution of ICH since 2/13 - echo with normal EF, no intracardiac source of embolism - CTA head/neck without intracranial arterial occlusion or high grade stenosis, no dissection, aneurysm, or hemodynamically significant stenosis of the carotid or vertebral arteries - head CT 3/9 with expected evolution of hemorrhage involving the right thalamus, basal ganglia, and adjacent white matter from prior exam with developing encephalomalacia.  Resolved previous intraventricular hemorrhage.  Persistent sulcal effacement with mild improvement.  Persistent 3 mm left to right midline shift.  Multiple areas of low attenuation consistent with infarcts within the central white matter, unchanged in distribution from prior.  (see report) - per neurology- see last follow up note from 2/25 - [place] likely 2/2 hypertension and nonadherence to meds - s/p trach/peg on 2/24 - no antithrombotic 2/2 ICH -for obstructive hydrocephalus -> s/p external ventricular catheter placement on 08/27/20 by neurosurgery - EVD clamped on 2/12 and d/c'd on 2/13 -EEG suggestive of cortical dysfunction in right hemisphere likely 2/2 underlying bleed as well as severe diffuse encephalopathy, nonspecific etiology  -only withdraws to painful stimuli - will need to continue goals of care/palliative, family are still hopeful for recovery.  -seizure precautions, aspiration precautions, nutrition, bowel regimen -No significant  improvement in her mental status  Neck Contracture  Pt  with head tilted to L, (concern that this may affect trach per PCCM) Start on baclofen, uptitrate as tolerated - continue PT/OT  Hypertensive emergency -Home medications include Coreg and HCTZ - increase metoprolol, start hydralazine - follow - adjust prn  -hydral and labetalol prn -SBP goal remains < 160  Sinus Tachycardia Follow on metop, persistent and relatively intermittent, possibly related to intermittent fevers (central fevers) Continue to monitor, workup as indicated Echo with EF 60-65% (see report)  Concern for pain/discomfort Mother noted concern for pain, discomfort -> this is possible and could lead to increased BP/HR.   Morphine prn Continue apap, prn  Fever  Serratia Pneumonia  Hospital Acquired Pneumonia:  -Tracheal aspirate from 2/25 and 3/1 with serratia - cefepime 2/27-3/5 -There is also concern for central fever, s/p buspirone - being treated for periorbital cellulitis as noted below  Acute hypoxemic respiratory failure secondary to CVA -S/P Trach -continue on trach collar -pulm continuing to follow for trach ->changed to cuffless trach on 3/11 per PCCM -Currently on 5 L  Dysphagia -s/p peg on tube feeds, seems to continue to tolerate well  Possible bacterial sinusitis -abx for below - CT head 3/9 with persistent subtotal opacification of paranasal sinuses and mastoid air cells, bubbly lucency at base of R maxillary sinus (likely odontogenic in origin)  Bacterial Conjunctivitis /periorbital cellulitis - MRI 3/7 orbits with L periorbital cellulitis  - s/p erythromycin ointment - continue ancef   Anasarca -bilateral upper extremity edema, minimal LE edema - suspect related to prolonged hospitalization, hypoalbuminemia -upper extremity US-> age indeterminate SVT of right cephalic vein  Elevated LFT's -mild, continue to monitor overall stable  Leukocytosis -Resolved on  antibiotics  Anemia -Stable, follow  DVT prophylaxis: lovenox Code Status: full  Family Communication: mother at bedside 3/11 Disposition:   Status is: Inpatient  Remains inpatient appropriate because:Inpatient level of care appropriate due to severity of illness   Dispo: The patient is from: Home              Anticipated d/c is to: epnding              Patient currently is not medically stable to d/c.   Difficult to place patient Yes   Consultants:   Palliative care  Pulm  Neurology  Procedures:   Trach/peg EEG IMPRESSION: This study is suggestive of cortical dysfunction in right hemisphere likely secondaru to underlyig bleed as well as severe diffuse encephalopathy, nonspecific etiology. No seizures or epileptiform discharges were seen throughout the recording.  Echo IMPRESSIONS    1. Left ventricular ejection fraction, by estimation, is 60 to 65%. The  left ventricle has normal function. The left ventricle has no regional  wall motion abnormalities. There is mild concentric left ventricular  hypertrophy. Left ventricular diastolic  parameters were normal.  2. Right ventricular systolic function is normal. The right ventricular  size is normal. There is normal pulmonary artery systolic pressure.  3. The mitral valve is normal in structure. Trivial mitral valve  regurgitation. No evidence of mitral stenosis.  4. The aortic valve is tricuspid. Aortic valve regurgitation is not  visualized. No aortic stenosis is present.  5. The inferior vena cava is normal in size with <50% respiratory  variability, suggesting right atrial pressure of 8 mmHg.   Conclusion(s)/Recommendation(s): Normal biventricular function without  evidence of hemodynamically significant valvular heart disease. No  intracardiac source of embolism detected on this transthoracic study. Rafeef Lau  transesophageal echocardiogram is  recommended to exclude cardiac source of  embolism if clinically  indicated.   Antimicrobials:  Anti-infectives (From admission, onward)   Start     Dose/Rate Route Frequency Ordered Stop   09/21/20 1545  ceFAZolin (ANCEF) IVPB 1 g/50 mL premix        1 g 100 mL/hr over 30 Minutes Intravenous Every 8 hours 09/21/20 1457     09/20/20 0600  ceFAZolin (ANCEF) IVPB 1 g/50 mL premix  Status:  Discontinued        1 g 100 mL/hr over 30 Minutes Intravenous Every 8 hours 09/14/20 0903 09/14/20 0904   09/16/20 1300  vancomycin (VANCOREADY) IVPB 1250 mg/250 mL  Status:  Discontinued        1,250 mg 166.7 mL/hr over 90 Minutes Intravenous Every 24 hours 09/15/20 1232 09/17/20 0956   09/15/20 1230  vancomycin (VANCOREADY) IVPB 1250 mg/250 mL        1,250 mg 166.7 mL/hr over 90 Minutes Intravenous  Once 09/15/20 1131 09/15/20 1402   09/13/20 1400  ceFEPIme (MAXIPIME) 2 g in sodium chloride 0.9 % 100 mL IVPB        2 g 200 mL/hr over 30 Minutes Intravenous Every 8 hours 09/13/20 1341 09/19/20 2208   09/06/20 0600  vancomycin (VANCOREADY) IVPB 1250 mg/250 mL        1,250 mg 166.7 mL/hr over 90 Minutes Intravenous  Once 09/06/20 0541 09/06/20 0739   09/05/20 1400  ceFAZolin (ANCEF) IVPB 1 g/50 mL premix  Status:  Discontinued        1 g 100 mL/hr over 30 Minutes Intravenous Every 8 hours 09/05/20 1120 09/13/20 1341   08/30/20 0600  vancomycin (VANCOREADY) IVPB 500 mg/100 mL  Status:  Discontinued        500 mg 100 mL/hr over 60 Minutes Intravenous Every 12 hours 08/29/20 1554 09/01/20 1115   08/29/20 1700  ceFEPIme (MAXIPIME) 2 g in sodium chloride 0.9 % 100 mL IVPB  Status:  Discontinued        2 g 200 mL/hr over 30 Minutes Intravenous Every 12 hours 08/29/20 1554 09/01/20 1115   08/29/20 1645  vancomycin (VANCOREADY) IVPB 1250 mg/250 mL        1,250 mg 166.7 mL/hr over 90 Minutes Intravenous  Once 08/29/20 1554 08/29/20 1820     Subjective: unresponsive  Objective: Vitals:   09/25/20 1605 09/25/20 1630 09/25/20 1653 09/25/20 1754  BP: (!) 169/136  (!)  158/111 (!) 165/101  Pulse: 100 (!) 103 97 97  Resp: (!) 27 20 (!) 22 18  Temp:   98.7 F (37.1 C)   TempSrc:   Axillary   SpO2: 100% 96% 96% 94%  Weight:      Height:        Intake/Output Summary (Last 24 hours) at 09/25/2020 1843 Last data filed at 09/25/2020 9509 Gross per 24 hour  Intake 589.59 ml  Output 400 ml  Net 189.59 ml   Filed Weights   09/23/20 0418 09/24/20 0010 09/25/20 0353  Weight: 67 kg 60.5 kg 66.5 kg    Examination:  General: No acute distress. HEENT: L periorbital swelling improvng  Cardiovascular: RRR Lungs: unlabored, diminished - trach Abdomen: Soft, nontender, nondistended. peg Neurological: responsive only to painful stimuli. Moves all extremities 4 with irritative/painful stimul. . Skin: Warm and dry. No rashes or lesions. Extremities: No clubbing or cyanosis. No edema.   Data Reviewed: I have personally reviewed following labs and imaging studies  CBC: Recent Labs  Lab 09/21/20 0301 09/22/20 0334 09/23/20 0341 09/24/20 0343  09/25/20 0331  WBC 9.2 10.4 9.9 8.9 6.9  NEUTROABS  --   --   --  6.2 4.4  HGB 9.3* 8.9* 9.0* 8.9* 8.1*  HCT 28.6* 28.0* 28.6* 27.6* 26.2*  MCV 80.1 80.7 81.5 81.2 82.4  PLT 458* 428* 412* 398 366    Basic Metabolic Panel: Recent Labs  Lab 09/21/20 0301 09/22/20 0334 09/23/20 0341 09/24/20 0343 09/25/20 0331  NA 139 139 140 141 139  K 4.0 3.9 3.7 3.8 3.8  CL 101 100 103 101 101  CO2 $Re'27 29 25 30 27  'WEO$ GLUCOSE 140* 119* 114* 123* 120*  BUN 21* 20 19 21* 20  CREATININE 0.86 0.80 0.77 0.82 0.73  CALCIUM 9.2 9.2 9.1 9.1 8.8*  MG  --   --   --  2.0 1.9  PHOS  --   --   --  4.7* 4.9*    GFR: Estimated Creatinine Clearance: 73.2 mL/min (by C-G formula based on SCr of 0.73 mg/dL).  Liver Function Tests: Recent Labs  Lab 09/22/20 0334 09/23/20 0341 09/24/20 0343 09/25/20 0331  AST 83* 79* 69* 61*  ALT 76* 74* 67* 58*  ALKPHOS 108 112 112 98  BILITOT 0.5 0.4 0.4 0.6  PROT 6.7 6.8 6.9 6.5  ALBUMIN  2.1* 2.2* 2.2* 2.2*    CBG: Recent Labs  Lab 09/24/20 2325 09/25/20 0423 09/25/20 0735 09/25/20 1108 09/25/20 1606  GLUCAP 132* 116* 108* 154* 120*     No results found for this or any previous visit (from the past 240 hour(s)).       Radiology Studies: CT HEAD WO CONTRAST  Result Date: 09/23/2020 CLINICAL DATA:  Delirium. EXAM: CT HEAD WITHOUT CONTRAST TECHNIQUE: Contiguous axial images were obtained from the base of the skull through the vertex without intravenous contrast. COMPARISON:  Most recent head CT 09/07/2020 FINDINGS: Brain: Expected evolution of hemorrhage involving the right thalamus, basal ganglia and adjacent white matter with minimal residual high-density components. There is developing encephalomalacia. Previous intraventricular hemorrhage has resolved. Persistent sulcal effacement with mild improvement. Persistent 3 mm left-to-right midline shift. Multiple areas of low attenuation within the central white matter are unchanged in distribution. Stable ventricular size from prior. No subdural collection. No evidence is no infarct or hemorrhage. Vascular: No hyperdense vessel. Skull: Prior right frontal ventriculostomy defect. No skull fracture. Sinuses/Orbits: Improved aeration of right ethmoid air cells. Otherwise subtotal opacification of paranasal sinuses. Bubbly lucency at the base of the right maxillary sinus is likely related to odontogenic infection. Complete opacification of left mastoid air cells unchanged. Subtotal opacification of right mastoid air cells. There is left periorbital soft tissue edema, which was assessed on MRI earlier today. Other: None. IMPRESSION: 1. Expected evolution of hemorrhage involving the right thalamus, basal ganglia and adjacent white matter from prior exam with developing encephalomalacia. Previous intraventricular hemorrhage has resolved. Persistent sulcal effacement with mild improvement. Persistent 3 mm left-to-right midline shift. 2.  Multiple areas of low attenuation consistent with infarcts within the central white matter are unchanged in distribution from prior. 3. Improved aeration of the right ethmoid air cells. Otherwise persistent subtotal opacification of paranasal sinuses and mastoid air cells. Bubbly lucency at the base of the right maxillary sinus is likely odontogenic in origin. Electronically Signed   By: Keith Rake M.D.   On: 09/23/2020 22:48        Scheduled Meds: .  stroke: mapping our early stages of recovery book   Does not apply Once  . atorvastatin  40  mg Per Tube Daily  . baclofen  5 mg Per Tube QHS  . chlorhexidine gluconate (MEDLINE KIT)  15 mL Mouth Rinse BID  . Chlorhexidine Gluconate Cloth  6 each Topical Daily  . enoxaparin (LOVENOX) injection  40 mg Subcutaneous Q24H  . feeding supplement (PROSource TF)  45 mL Per Tube Daily  . free water  100 mL Per Tube Q4H  . hydrALAZINE  25 mg Per Tube Q8H  . insulin aspart  0-9 Units Subcutaneous Q4H  . mouth rinse  15 mL Mouth Rinse 10 times per day  . metoprolol tartrate  75 mg Per Tube BID  . pantoprazole sodium  40 mg Per Tube BID  . scopolamine  1 patch Transdermal Q72H   Continuous Infusions: . sodium chloride 10 mL/hr at 09/25/20 0604  .  ceFAZolin (ANCEF) IV 1 g (09/25/20 1400)  . feeding supplement (OSMOLITE 1.2 CAL) 1,000 mL (09/24/20 0342)     LOS: 29 days    Time spent: over 30 min    Fayrene Helper, MD [facility]   To contact the attending provider between 7A-7P or the covering provider during after hours 7P-7A, please log into the web site [email or web address] and access using universal [place] password for that web site. If you do not have the password, please call the hospital operator.  09/25/2020, 6:43 PM

## 2020-09-25 NOTE — Progress Notes (Signed)
Patient ID: Charlene [name], female   DOB: 12-17-1965, 55 y.o.   MRN: [id number]  Medical records reviewed   55 year old female with past medical history as below, which is significant for uncontrolled  hypertension and reported medication noncompliance.  Presented to the emergency department at [facility] 2/9 with c/o headache and left-sided weakness.  STAT CT Head demonstrated right thalamic/basal ganglia hemorrhage with intraventricular extension.  She was transferred to [facility] emergency department for in person neurology evaluation. There was evidence of obstructive hydrocephalus and neurosurgery was consulted. External ventricular drain was placed 2/10. In the hours following she became increasingly obtunded. PCCM was consulted. Upon PCCM arrival the patient became apneic and was intubated. EVD removed 2/13     Family meeting held 09-03-20     Trach/PEG 09-10-20 Hospital course complicated by persistent fever,Serratia pneumoniaand now pre-orbital cellulitis  Today is day  28 of this hospitalization.  Patient remains unresponsive, unable to follow commands, with extensor posturing, unresponsive to gentle touch and verbal stimuli.  Nursing reports response to painful stimuli Likely long term poor prognosis for meaningful recovery.  Case discussed with Dr Lowell Guitar and RN/Danny.  Concern today for pain and discomfort as discussed with patient's mother at bedside with the attending.  Discussion regarding the impact of pain on blood pressure and heart rate.  Morphine order to try as needed. Per nursing there were some concerns expressed by family regarding utilization of morphine  [name] at bedside.  Education offered on the utilization of low-dose, as needed opioid for pain and dyspnea.  Risks and benefits discussed. Questions and concerns addressed in detail.  Husband/[name] is comfortable with the utilization of low-dose morphine for symptom management.  His concern was that the  medication would prevent her from waking up.   "We are hopeful that patient will wake up and we do not want to do anything to jeopardize that"  Ongoing information and education  regarding diagnosis, prognosis, treatment options/ decisions and anticipatory care needs offered.     Family remains hopeful, faith and prayer are foundational for family   Emotional support and therapeutic listening.  Total time spent on the unit was 70  minutes   PMT will continue to support holistically  Greater than 50% of the time was spent in counseling and coordination of care   Lorinda Creed NP  Palliative Medicine Team Team Phone # [phone] Pager [phone]

## 2020-09-26 ENCOUNTER — Inpatient Hospital Stay (HOSPITAL_COMMUNITY): Payer: Medicaid Other

## 2020-09-26 LAB — GLUCOSE, CAPILLARY
Glucose-Capillary: 113 mg/dL — ABNORMAL HIGH (ref 70–99)
Glucose-Capillary: 114 mg/dL — ABNORMAL HIGH (ref 70–99)
Glucose-Capillary: 124 mg/dL — ABNORMAL HIGH (ref 70–99)
Glucose-Capillary: 136 mg/dL — ABNORMAL HIGH (ref 70–99)
Glucose-Capillary: 143 mg/dL — ABNORMAL HIGH (ref 70–99)
Glucose-Capillary: 157 mg/dL — ABNORMAL HIGH (ref 70–99)

## 2020-09-26 LAB — COMPREHENSIVE METABOLIC PANEL
ALT: 65 U/L — ABNORMAL HIGH (ref 0–44)
AST: 68 U/L — ABNORMAL HIGH (ref 15–41)
Albumin: 2.3 g/dL — ABNORMAL LOW (ref 3.5–5.0)
Alkaline Phosphatase: 109 U/L (ref 38–126)
Anion gap: 8 (ref 5–15)
BUN: 19 mg/dL (ref 6–20)
CO2: 30 mmol/L (ref 22–32)
Calcium: 9.2 mg/dL (ref 8.9–10.3)
Chloride: 102 mmol/L (ref 98–111)
Creatinine, Ser: 0.71 mg/dL (ref 0.44–1.00)
GFR, Estimated: >60 mL/min (ref >60)
Glucose, Bld: 110 mg/dL — ABNORMAL HIGH (ref 70–99)
Potassium: 3.7 mmol/L (ref 3.5–5.1)
Sodium: 140 mmol/L (ref 135–145)
Total Bilirubin: 0.2 mg/dL — ABNORMAL LOW (ref 0.3–1.2)
Total Protein: 7 g/dL (ref 6.5–8.1)

## 2020-09-26 LAB — CBC WITH DIFFERENTIAL/PLATELET
Abs Immature Granulocytes: 0.03 10*3/uL (ref 0.00–0.07)
Basophils Absolute: 0 10*3/uL (ref 0.0–0.1)
Basophils Relative: 0 %
Eosinophils Absolute: 0.1 10*3/uL (ref 0.0–0.5)
Eosinophils Relative: 1 %
HCT: 28.7 % — ABNORMAL LOW (ref 36.0–46.0)
Hemoglobin: 9 g/dL — ABNORMAL LOW (ref 12.0–15.0)
Immature Granulocytes: 0 %
Lymphocytes Relative: 18 %
Lymphs Abs: 1.5 10*3/uL (ref 0.7–4.0)
MCH: 25.6 pg — ABNORMAL LOW (ref 26.0–34.0)
MCHC: 31.4 g/dL (ref 30.0–36.0)
MCV: 81.5 fL (ref 80.0–100.0)
Monocytes Absolute: 0.7 10*3/uL (ref 0.1–1.0)
Monocytes Relative: 8 %
Neutro Abs: 6.1 10*3/uL (ref 1.7–7.7)
Neutrophils Relative %: 73 %
Platelets: 375 10*3/uL (ref 150–400)
RBC: 3.52 MIL/uL — ABNORMAL LOW (ref 3.87–5.11)
RDW: 17.3 % — ABNORMAL HIGH (ref 11.5–15.5)
WBC: 8.4 10*3/uL (ref 4.0–10.5)
nRBC: 0 % (ref 0.0–0.2)

## 2020-09-26 LAB — PHOSPHORUS: Phosphorus: 4.3 mg/dL (ref 2.5–4.6)

## 2020-09-26 LAB — MAGNESIUM: Magnesium: 2 mg/dL (ref 1.7–2.4)

## 2020-09-26 IMAGING — CT CT MAXILLOFACIAL W/ CM
1 of 2 series · 13 of 30 positions shown, 17 images · IV contrast (omnipaque)
Comparison: Head CT [DATE].  Orbit MRI [DATE].

CLINICAL DATA: Cellulitis.  Abnormal CT scan.

EXAM:
CT MAXILLOFACIAL WITH CONTRAST
TECHNIQUE: Multidetector CT imaging of the maxillofacial structures was
performed with intravenous contrast. Multiplanar CT image
reconstructions were also generated.
CONTRAST:  75mL OMNIPAQUE IOHEXOL 300 MG/ML  SOLN

[Series 3: maxilllofacial 2.0 hr40 3 · axial · 0.41mm/px · z∈[-470,-306]mm · 13 of 96 slices shown, 17 images]
[im 7/96  brain]
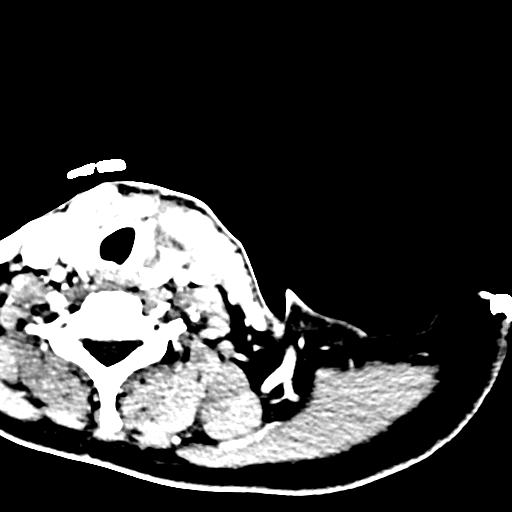
[im 7/96  bone]
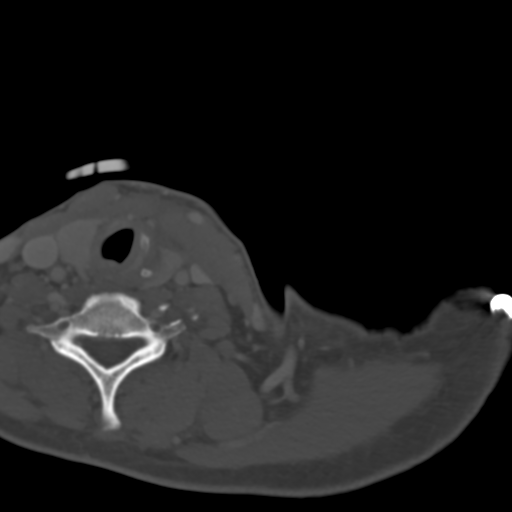
[im 14/96  bone]
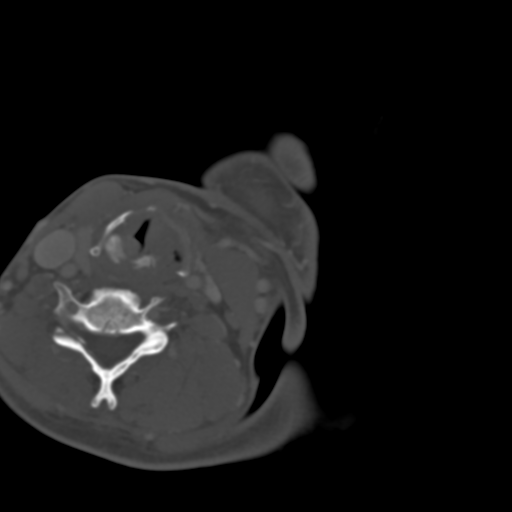
[im 21/96  bone]
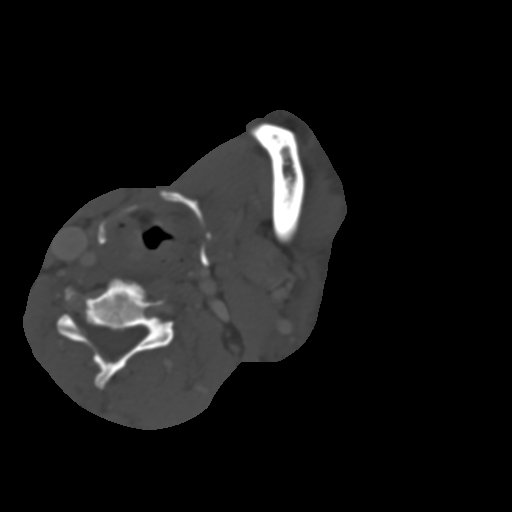
[im 28/96  bone]
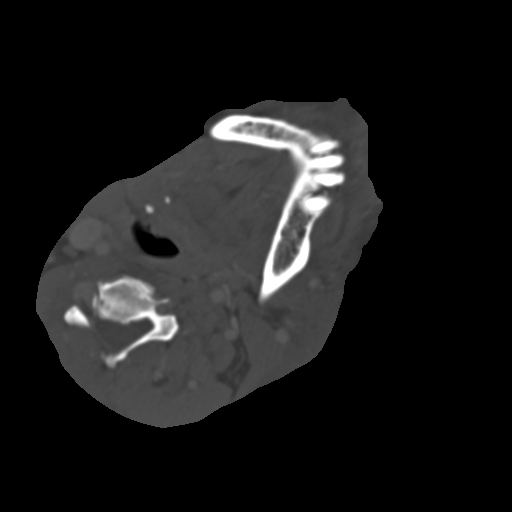
[im 34/96  brain]
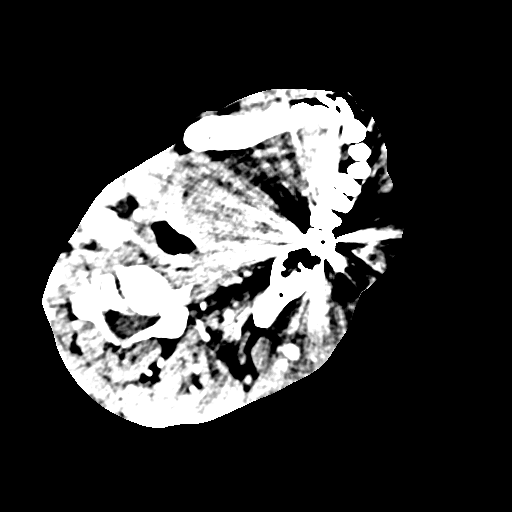
[im 34/96  bone]
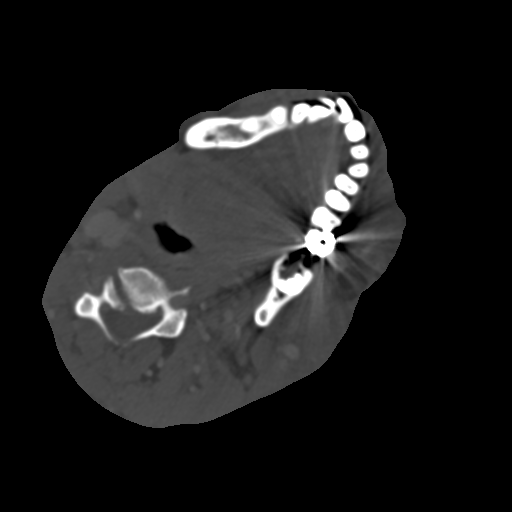
[im 41/96  bone]
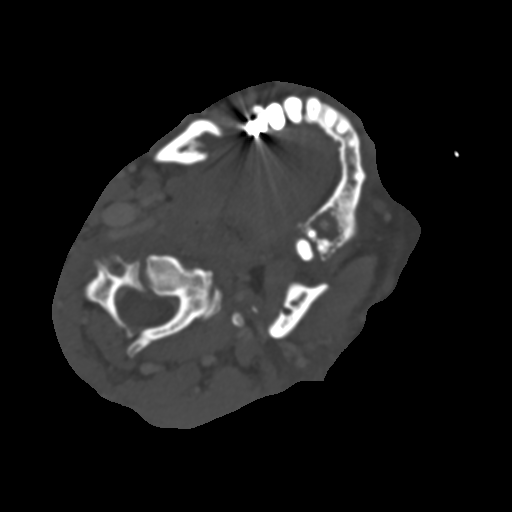
[im 48/96  bone]
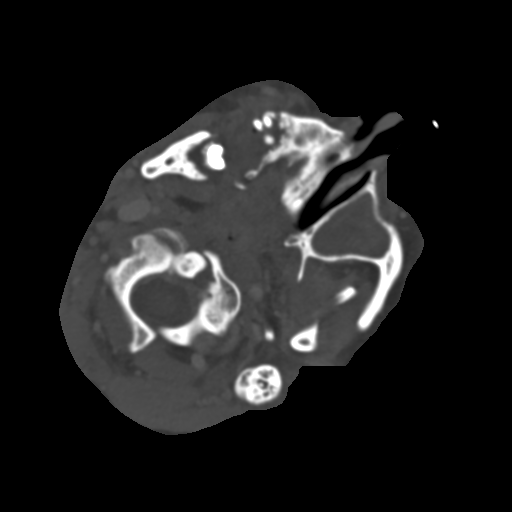
[im 55/96  bone]
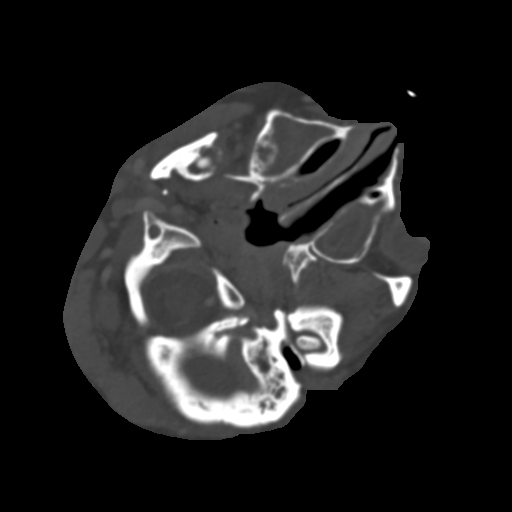
[im 62/96  brain]
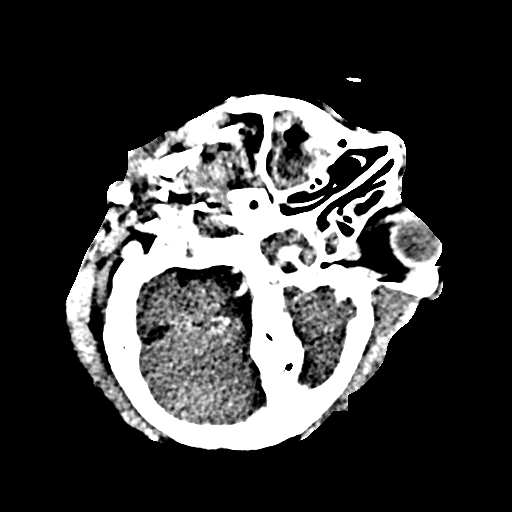
[im 62/96  bone]
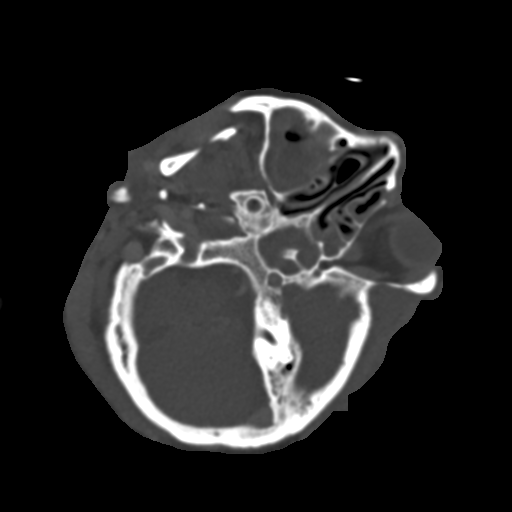
[im 68/96  bone]
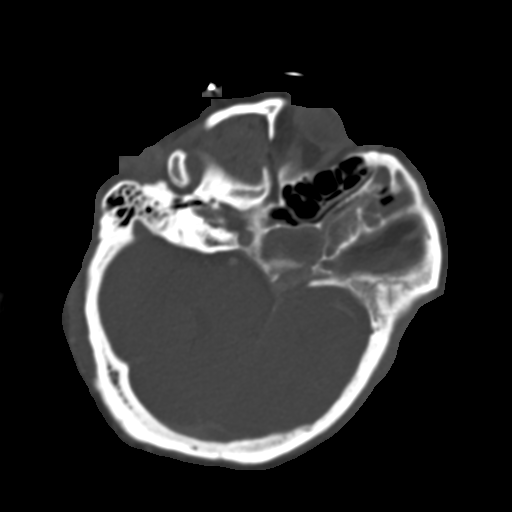
[im 75/96  bone]
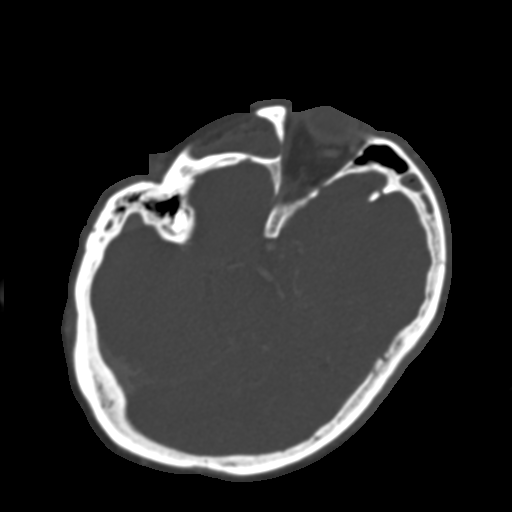
[im 82/96  bone]
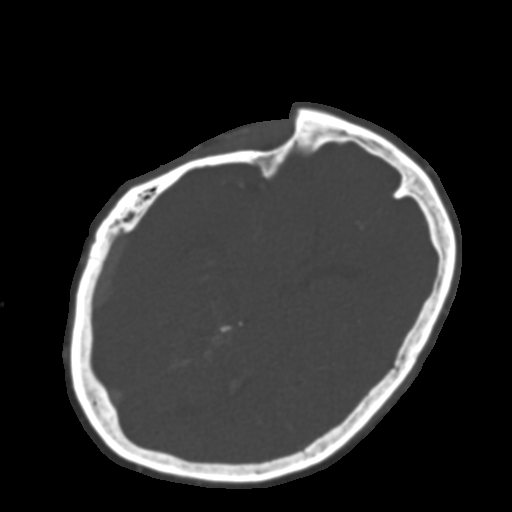
[im 89/96  brain]
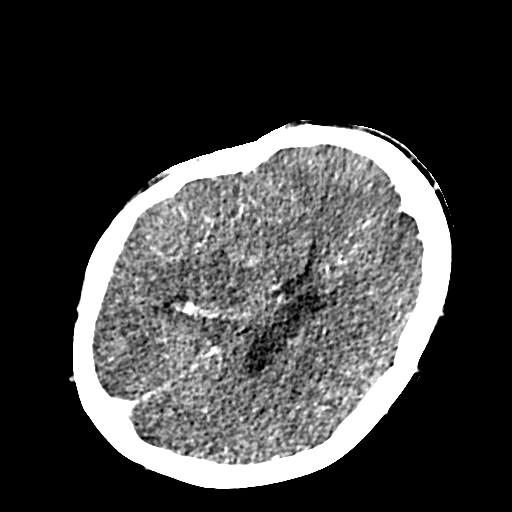
[im 89/96  bone]
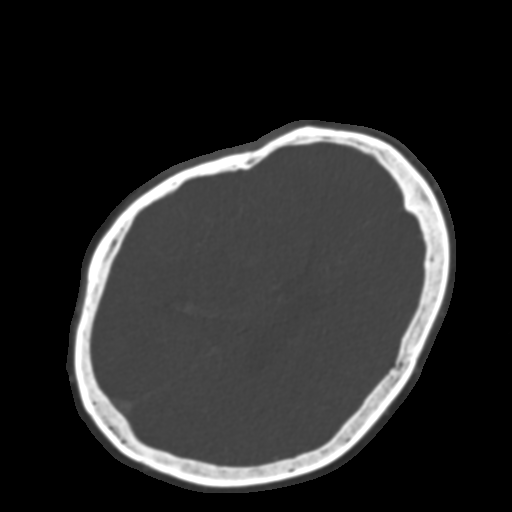

[13 of 30 positions shown; findings below may reference images not displayed]

FINDINGS: Osseous: No acute fracture. Prominent periapical erosions involving
maxillary and mandibular molar teeth bilaterally.

Orbits: The globes appear intact. No postseptal orbital inflammation
or mass is identified.

Sinuses: Persistent circumferential mucosal thickening and fluid in
the maxillary sinuses resulting in complete opacification on the
left and subtotal opacification on the right, unchanged from the
prior MRI. Unchanged complete opacification of the left sphenoid
sinus, likely by combination of mucosal thickening and fluid.
Layering fluid in the right sphenoid sinus. Persistent near complete
opacification of the left ethmoid air cells and left frontal sinus.

Soft tissues: Mild-to-moderate left periorbital soft tissue edema,
mildly improved from the prior MRI. Mild-to-moderate edema
throughout the subcutaneous soft tissues of the left face more
inferiorly and posteriorly, also decreased compared to the imaged
portions on the prior MRI. No fluid collection. Subcentimeter nodule
in the right thyroid lobe for which no imaging follow-up is
recommended.

Limited intracranial: Low-density in the right thalamic region
corresponding to the previously demonstrated hemorrhage.
IMPRESSION: 1. Decreased left periorbital and left facial soft tissue edema
suggestive of improving cellulitis. No abscess.
2. Persistent pansinusitis.
3. Poor dentition.

## 2020-09-26 MED ORDER — IOHEXOL 300 MG/ML  SOLN
75.0000 mL | Freq: Once | INTRAMUSCULAR | Status: AC | PRN
  Administered 2020-09-26: 75 mL via INTRAVENOUS

## 2020-09-26 NOTE — Progress Notes (Signed)
OT Cancellation Note  Patient Details Name: Charlene [name] MRN: [id number] DOB: 01/12/1966   Cancelled Treatment:    Reason Eval/Treat Not Completed: Patient at procedure or test/ unavailable (CT)  [name], OTR/L  Acute Rehabilitation Services Pager: [phone] Office: [phone] .  09/26/2020, 11:30 AM

## 2020-09-26 NOTE — Progress Notes (Signed)
PROGRESS NOTE    Charlene [name]  UDJ:[id number] DOB: 05-Feb-1966 DOA: 08/26/2020 PCP: Patient, [name]   Chief Complaint  Patient presents with  . Code Stroke   Brief Narrative:  55 yo F with 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 Subsequently on 2/10, she developed obtundation and respiratory arrest leading to intubation.  She has remained persistently obtunded, now has Kelley Polinsky trach and Onica Davidovich PEG.  Hospital course complicated by persistent fever, Serratia pneumonia and now pre-orbital cellulitis  2/09 Admit for ICH 2/10 EVD placed, later intubated due to obtundation and respiratory arrest 2/11 MRI Brain, hypertonic saline d/c'd, EVD not working. 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 Intraventricular penetration with blood filling the 3rd and forth ventricles and nearly filling the right lateral ventricle. MRI 2/11 with intraparenchymal hemorrhage centered within the right thalamus and basal ganglia extending into the right midbrain with intraventricular extension. Effacement of prepontine cistern and similar 4 mm of leftward midline shift. Separate areas of ristricted diffusion and edema involving the right greater than left mesial temporal lobes / parahippocampal cortex, bilateral basal ganglia, and possibly the upper pons that are  concerning for acute infarcts, most likely 2/2 mass effect from the hemorrhage (see report)/ Per neuro "scattered b/l hippocampus, posterior CC, bilateral thalamus, and upper pons, likely due to deep penetrating vessel compression by focal mass-effect".  2/19 MRI with acute to early subacute infarcts in the bilateral cerebral white matter and right cerebellum, new from 2/11 - interval evolution of multiple other infarcts present on prior MRI, increased hemorrhage along old right EVD tract (see report) - 2/21 head CT with expected evolution of ICH since 2/13 - echo with normal EF, no intracardiac source of embolism - CTA head/neck without intracranial arterial occlusion or high grade stenosis, no dissection, aneurysm, or hemodynamically significant stenosis of the carotid or vertebral arteries - head CT 3/9 with expected evolution of hemorrhage involving the right thalamus, basal ganglia, and adjacent white matter from prior exam with developing encephalomalacia.  Resolved previous intraventricular hemorrhage.  Persistent sulcal effacement with mild improvement.  Persistent 3 mm left to right midline shift.  Multiple areas of low attenuation consistent with infarcts within the central white matter, unchanged in distribution from prior.  (see report) - per neurology- see last follow up note from 2/25 - [place] likely 2/2 hypertension and nonadherence to meds - s/p trach/peg on 2/24 - no antithrombotic 2/2 ICH -for obstructive hydrocephalus -> s/p external ventricular catheter placement on 08/27/20 by neurosurgery - EVD clamped on 2/12 and d/c'd on 2/13 -EEG suggestive of cortical dysfunction in right hemisphere likely 2/2 underlying bleed as well as severe diffuse encephalopathy, nonspecific etiology  -only withdraws to painful stimuli - will need to continue goals of care/palliative, family are still hopeful for recovery.  -seizure precautions, aspiration precautions, nutrition, bowel regimen -No significant  improvement in her mental status  Neck Contracture  Pt  with head tilted to L, (concern that this may affect trach per PCCM) Start on baclofen, uptitrate as tolerated - continue PT/OT  Hypertensive emergency -Home medications include Coreg and HCTZ - increase metoprolol, start hydralazine - follow, fluctuating - adjust prn  -hydral and labetalol prn -SBP goal remains < 160  Sinus Tachycardia Follow on metop, persistent and relatively intermittent, possibly related to intermittent fevers (central fevers) Continue to monitor, workup as indicated Echo with EF 60-65% (see report)  Concern for pain/discomfort Mother noted concern for pain, discomfort -> this is possible and could lead to increased BP/HR.   Morphine prn Continue apap, prn  Fever  Serratia Pneumonia  Hospital Acquired Pneumonia:  -Tracheal aspirate from 2/25 and 3/1 with serratia - cefepime 2/27-3/5 -There is also concern for central fever, s/p buspirone - being treated for periorbital cellulitis as noted below  Acute hypoxemic respiratory failure secondary to CVA -S/P Trach -continue on trach collar -pulm continuing to follow for trach ->changed to cuffless trach on 3/11 per PCCM -Currently on 5 L  Dysphagia -s/p peg on tube feeds, seems to continue to tolerate well  Possible bacterial sinusitis -abx for below - CT head 3/9 with persistent subtotal opacification of paranasal sinuses and mastoid air cells, bubbly lucency at base of R maxillary sinus (likely odontogenic in origin)  Bacterial Conjunctivitis /periorbital cellulitis - MRI 3/7 orbits with L periorbital cellulitis  - CT maxillofacial today 3/12 - s/p erythromycin ointment - continue ancef   Anasarca -bilateral upper extremity edema, minimal LE edema - suspect related to prolonged hospitalization, hypoalbuminemia -upper extremity US-> age indeterminate SVT of right cephalic vein  Elevated LFT's -mild, continue to monitor overall  stable  Leukocytosis -Resolved on antibiotics  Anemia -Stable, follow  DVT prophylaxis: lovenox Code Status: full  Family Communication: mother at bedside 3/11 Disposition:   Status is: Inpatient  Remains inpatient appropriate because:Inpatient level of care appropriate due to severity of illness   Dispo: The patient is from: Home              Anticipated d/c is to: epnding              Patient currently is not medically stable to d/c.   Difficult to place patient Yes   Consultants:   Palliative care  Pulm  Neurology  Procedures:   Trach/peg EEG IMPRESSION: This study is suggestive of cortical dysfunction in right hemisphere likely secondaru to underlyig bleed as well as severe diffuse encephalopathy, nonspecific etiology. No seizures or epileptiform discharges were seen throughout the recording.  Echo IMPRESSIONS    1. Left ventricular ejection fraction, by estimation, is 60 to 65%. The  left ventricle has normal function. The left ventricle has no regional  wall motion abnormalities. There is mild concentric left ventricular  hypertrophy. Left ventricular diastolic  parameters were normal.  2. Right ventricular systolic function is normal. The right ventricular  size is normal. There is normal pulmonary artery systolic pressure.  3. The mitral valve is normal in structure. Trivial mitral valve  regurgitation. No evidence of mitral stenosis.  4. The aortic valve is tricuspid. Aortic valve regurgitation is not  visualized. No aortic stenosis is present.  5. The inferior vena cava is normal in size with <50% respiratory  variability, suggesting right atrial pressure of 8 mmHg.   Conclusion(s)/Recommendation(s): Normal biventricular function without  evidence of hemodynamically significant valvular heart disease. No  intracardiac source of embolism detected on this transthoracic study. Woodson Macha  transesophageal echocardiogram is  recommended to exclude  cardiac source of embolism if clinically indicated.   Antimicrobials:  Anti-infectives (From admission, onward)   Start     Dose/Rate Route Frequency Ordered Stop   09/21/20 1545  ceFAZolin (ANCEF) IVPB 1 g/50 mL premix        1 g 100 mL/hr over 30 Minutes Intravenous Every 8 hours 09/21/20 1457 09/28/20 1359   09/20/20 0600  ceFAZolin (ANCEF) IVPB 1 g/50 mL premix  Status:  Discontinued        1 g 100 mL/hr over 30 Minutes Intravenous Every 8 hours 09/14/20 0903 09/14/20 0904   09/16/20 1300  vancomycin (VANCOREADY) IVPB 1250 mg/250 mL  Status:  Discontinued        1,250 mg 166.7 mL/hr over 90 Minutes Intravenous Every 24 hours 09/15/20 1232 09/17/20 0956   09/15/20 1230  vancomycin (VANCOREADY) IVPB 1250 mg/250 mL        1,250 mg 166.7 mL/hr over 90 Minutes Intravenous  Once 09/15/20 1131 09/15/20 1402   09/13/20 1400  ceFEPIme (MAXIPIME) 2 g in sodium chloride 0.9 % 100 mL IVPB        2 g 200 mL/hr over 30 Minutes Intravenous Every 8 hours 09/13/20 1341 09/19/20 2208   09/06/20 0600  vancomycin (VANCOREADY) IVPB 1250 mg/250 mL        1,250 mg 166.7 mL/hr over 90 Minutes Intravenous  Once 09/06/20 0541 09/06/20 0739   09/05/20 1400  ceFAZolin (ANCEF) IVPB 1 g/50 mL premix  Status:  Discontinued        1 g 100 mL/hr over 30 Minutes Intravenous Every 8 hours 09/05/20 1120 09/13/20 1341   08/30/20 0600  vancomycin (VANCOREADY) IVPB 500 mg/100 mL  Status:  Discontinued        500 mg 100 mL/hr over 60 Minutes Intravenous Every 12 hours 08/29/20 1554 09/01/20 1115   08/29/20 1700  ceFEPIme (MAXIPIME) 2 g in sodium chloride 0.9 % 100 mL IVPB  Status:  Discontinued        2 g 200 mL/hr over 30 Minutes Intravenous Every 12 hours 08/29/20 1554 09/01/20 1115   08/29/20 1645  vancomycin (VANCOREADY) IVPB 1250 mg/250 mL        1,250 mg 166.7 mL/hr over 90 Minutes Intravenous  Once 08/29/20 1554 08/29/20 1820     Subjective: unresponsive  Objective: Vitals:   09/26/20 0754 09/26/20 1132  09/26/20 1200 09/26/20 1330  BP: (!) 172/102   106/60  Pulse: (!) 110  80   Resp: (!) _0 Temp: 99.5 F (37.5 C) 98.9 F (37.2 C)    TempSrc: Axillary Axillary    SpO2:   100%   Weight:      Height:        Intake/Output Summary (Last 24 hours) at 09/26/2020 1453 Last data filed at 09/26/2020 0308 Gross per 24 hour  Intake --  Output 300 ml  Net -300 ml   Filed Weights   09/23/20 0418 09/24/20 0010 09/25/20 0353  Weight: 67 kg 60.5 kg 66.5 kg    Examination:  General: No acute distress. L periorbital swelling Cardiovascular: Heart sounds show Mozetta Murfin regular rate, and rhythm Lungs: Clear to auscultation bilaterally - diminished - trach Abdomen: Soft, nontender, nondistended - peg Neurological: unresponsive, withdraws all extremities from painful/irritative stimuli  Skin: Warm and dry. No rashes or lesions. Extremities: edema to upper extremities > LE   Data Reviewed: I have personally reviewed following labs and imaging studies  CBC: Recent Labs  Lab 09/22/20 0334 09/23/20 0341 09/24/20 0343 09/25/20 0331 09/26/20 0217  WBC 10.4 9.9 8.9 6.9 8.4  NEUTROABS  --   --  6.2 4.4 6.1  HGB 8.9* 9.0* 8.9* 8.1* 9.0*  HCT 28.0* 28.6* 27.6* 26.2* 28.7*  MCV 80.7 81.5 81.2 82.4 81.5  PLT 428* 412* 398 347 909    Basic Metabolic Panel: Recent Labs  Lab 09/22/20 0334 09/23/20 0341 09/24/20 0343 09/25/20 0331 09/26/20 0217  NA 139 140 141 139 140  K 3.9 3.7 3.8 3.8 3.7  CL 100 103 101 101 102  CO2 _0 GLUCOSE 119* 114* 123* 120* 110*  BUN 20 19 21* 20 19  CREATININE 0.80 0.77 0.82 0.73 0.71  CALCIUM 9.2 9.1 9.1 8.8* 9.2  MG  --   --  2.0 1.9 2.0  PHOS  --   --  4.7* 4.9* 4.3    GFR: Estimated Creatinine Clearance: 73.2 mL/min (by C-G formula based on SCr of 0.71 mg/dL).  Liver Function Tests: Recent Labs  Lab 09/22/20 0334 09/23/20 0341 09/24/20 0343 09/25/20 0331 09/26/20 0217  AST 83* 79* 69* 61* 68*  ALT 76* 74* 67* 58* 65*  ALKPHOS  108 112 112 98 109  BILITOT 0.5 0.4 0.4 0.6 0.2*  PROT 6.7 6.8 6.9 6.5 7.0  ALBUMIN 2.1* 2.2* 2.2* 2.2* 2.3*    CBG: Recent Labs  Lab 09/25/20 1921 09/25/20 2336 09/26/20 0305 09/26/20 0751 09/26/20 1135  GLUCAP 128* 134* 114* 113* 143*     No results found for this or any previous visit (from the past 240 hour(s)).       Radiology Studies: No results found.      Scheduled Meds: .  stroke: mapping our early stages of recovery book   Does not apply Once  . atorvastatin  40 mg Per Tube Daily  . baclofen  5 mg Per Tube QHS  . chlorhexidine gluconate (MEDLINE KIT)  15 mL Mouth Rinse BID  . Chlorhexidine Gluconate Cloth  6 each Topical Daily  . enoxaparin (LOVENOX) injection  40 mg Subcutaneous Q24H  . feeding supplement (PROSource TF)  45 mL Per Tube Daily  . free water  100 mL Per Tube Q4H  . hydrALAZINE  25 mg Per Tube Q8H  . insulin aspart  0-9 Units Subcutaneous Q4H  . mouth rinse  15 mL Mouth Rinse 10 times per day  . metoprolol tartrate  75 mg Per Tube BID  . pantoprazole sodium  40 mg Per Tube BID  . scopolamine  1 patch Transdermal Q72H   Continuous Infusions: . sodium chloride 10 mL/hr at 09/25/20 2309  .  ceFAZolin (ANCEF) IV 1 g (09/26/20 1329)  . feeding supplement (OSMOLITE 1.2 CAL) 1,000 mL (09/24/20 0342)     LOS: 30 days    Time spent: over 30 min    Fayrene Helper, MD [facility]   To contact the attending provider between 7A-7P or the covering provider during after hours 7P-7A, please log into the web site [email or web address] and access using universal [place] password for that web site. If you do not have the password, please call the hospital operator.  09/26/2020, 2:53 PM

## 2020-09-27 LAB — GLUCOSE, CAPILLARY
Glucose-Capillary: 127 mg/dL — ABNORMAL HIGH (ref 70–99)
Glucose-Capillary: 113 mg/dL — ABNORMAL HIGH (ref 70–99)
Glucose-Capillary: 116 mg/dL — ABNORMAL HIGH (ref 70–99)
Glucose-Capillary: 116 mg/dL — ABNORMAL HIGH (ref 70–99)
Glucose-Capillary: 131 mg/dL — ABNORMAL HIGH (ref 70–99)
Glucose-Capillary: 128 mg/dL — ABNORMAL HIGH (ref 70–99)

## 2020-09-27 LAB — COMPREHENSIVE METABOLIC PANEL
ALT: 93 U/L — ABNORMAL HIGH (ref 0–44)
AST: 103 U/L — ABNORMAL HIGH (ref 15–41)
Albumin: 2.3 g/dL — ABNORMAL LOW (ref 3.5–5.0)
Alkaline Phosphatase: 109 U/L (ref 38–126)
Anion gap: 7 (ref 5–15)
BUN: 20 mg/dL (ref 6–20)
CO2: 31 mmol/L (ref 22–32)
Calcium: 9.1 mg/dL (ref 8.9–10.3)
Chloride: 103 mmol/L (ref 98–111)
Creatinine, Ser: 0.82 mg/dL (ref 0.44–1.00)
GFR, Estimated: >60 mL/min (ref >60)
Glucose, Bld: 94 mg/dL (ref 70–99)
Potassium: 3.9 mmol/L (ref 3.5–5.1)
Sodium: 141 mmol/L (ref 135–145)
Total Bilirubin: 0.4 mg/dL (ref 0.3–1.2)
Total Protein: 6.7 g/dL (ref 6.5–8.1)

## 2020-09-27 LAB — CBC WITH DIFFERENTIAL/PLATELET
Abs Immature Granulocytes: 0.03 10*3/uL (ref 0.00–0.07)
Basophils Absolute: 0.1 10*3/uL (ref 0.0–0.1)
Basophils Relative: 1 %
Eosinophils Absolute: 0.1 10*3/uL (ref 0.0–0.5)
Eosinophils Relative: 1 %
HCT: 27.1 % — ABNORMAL LOW (ref 36.0–46.0)
Hemoglobin: 8.6 g/dL — ABNORMAL LOW (ref 12.0–15.0)
Immature Granulocytes: 0 %
Lymphocytes Relative: 20 %
Lymphs Abs: 1.7 10*3/uL (ref 0.7–4.0)
MCH: 26 pg (ref 26.0–34.0)
MCHC: 31.7 g/dL (ref 30.0–36.0)
MCV: 81.9 fL (ref 80.0–100.0)
Monocytes Absolute: 0.6 10*3/uL (ref 0.1–1.0)
Monocytes Relative: 7 %
Neutro Abs: 6.1 10*3/uL (ref 1.7–7.7)
Neutrophils Relative %: 71 %
Platelets: 352 10*3/uL (ref 150–400)
RBC: 3.31 MIL/uL — ABNORMAL LOW (ref 3.87–5.11)
RDW: 17.5 % — ABNORMAL HIGH (ref 11.5–15.5)
WBC: 8.6 10*3/uL (ref 4.0–10.5)
nRBC: 0 % (ref 0.0–0.2)

## 2020-09-27 LAB — PHOSPHORUS: Phosphorus: 4.7 mg/dL — ABNORMAL HIGH (ref 2.5–4.6)

## 2020-09-27 LAB — MAGNESIUM: Magnesium: 2 mg/dL (ref 1.7–2.4)

## 2020-09-27 MED ORDER — HYDRALAZINE HCL 50 MG PO TABS
50.0000 mg | ORAL_TABLET | Freq: Three times a day (TID) | ORAL | Status: DC
  Administered 2020-09-27 – 2021-01-18 (×333): 50 mg
  Filled 2020-09-27 (×337): qty 1

## 2020-09-27 NOTE — Progress Notes (Signed)
PROGRESS NOTE    Charlene [name]  UDJ:[id number] DOB: 05-Feb-1966 DOA: 08/26/2020 PCP: Patient, [name]   Chief Complaint  Patient presents with  . Code Stroke   Brief Narrative:  55 yo F with 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 Subsequently on 2/10, she developed obtundation and respiratory arrest leading to intubation.  She has remained persistently obtunded, now has Javier Gell trach and Dave Mergen PEG.  Hospital course complicated by persistent fever, Serratia pneumonia and now pre-orbital cellulitis  2/09 Admit for ICH 2/10 EVD placed, later intubated due to obtundation and respiratory arrest 2/11 MRI Brain, hypertonic saline d/c'd, EVD not working. 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 Intraventricular penetration with blood filling the 3rd and forth ventricles and nearly filling the right lateral ventricle. MRI 2/11 with intraparenchymal hemorrhage centered within the right thalamus and basal ganglia extending into the right midbrain with intraventricular extension. Effacement of prepontine cistern and similar 4 mm of leftward midline shift. Separate areas of ristricted diffusion and edema involving the right greater than left mesial temporal lobes / parahippocampal cortex, bilateral basal ganglia, and possibly the upper pons that are  concerning for acute infarcts, most likely 2/2 mass effect from the hemorrhage (see report)/ Per neuro "scattered b/l hippocampus, posterior CC, bilateral thalamus, and upper pons, likely due to deep penetrating vessel compression by focal mass-effect".  2/19 MRI with acute to early subacute infarcts in the bilateral cerebral white matter and right cerebellum, new from 2/11 - interval evolution of multiple other infarcts present on prior MRI, increased hemorrhage along old right EVD tract (see report) - 2/21 head CT with expected evolution of ICH since 2/13 - echo with normal EF, no intracardiac source of embolism - CTA head/neck without intracranial arterial occlusion or high grade stenosis, no dissection, aneurysm, or hemodynamically significant stenosis of the carotid or vertebral arteries - head CT 3/9 with expected evolution of hemorrhage involving the right thalamus, basal ganglia, and adjacent white matter from prior exam with developing encephalomalacia.  Resolved previous intraventricular hemorrhage.  Persistent sulcal effacement with mild improvement.  Persistent 3 mm left to right midline shift.  Multiple areas of low attenuation consistent with infarcts within the central white matter, unchanged in distribution from prior.  (see report) - per neurology- see last follow up note from 2/25 - [place] likely 2/2 hypertension and nonadherence to meds - s/p trach/peg on 2/24 - no antithrombotic 2/2 ICH -for obstructive hydrocephalus -> s/p external ventricular catheter placement on 08/27/20 by neurosurgery - EVD clamped on 2/12 and d/c'd on 2/13 -EEG suggestive of cortical dysfunction in right hemisphere likely 2/2 underlying bleed as well as severe diffuse encephalopathy, nonspecific etiology  -only withdraws to painful stimuli - will need to continue goals of care/palliative, family are still hopeful for recovery.  -seizure precautions, aspiration precautions, nutrition, bowel regimen -No significant  improvement in her mental status  Neck Contracture  Pt  with head tilted to L, (concern that this may affect trach per PCCM) Start on baclofen, uptitrate as tolerated - continue PT/OT  Hypertensive emergency -Home medications include Coreg and HCTZ - increase metoprolol, start hydralazine - follow, fluctuating - adjust prn  -hydral and labetalol prn -SBP goal remains < 160  Sinus Tachycardia Follow on metop, persistent and relatively intermittent, possibly related to intermittent fevers (central fevers) Continue to monitor, workup as indicated Echo with EF 60-65% (see report)  Concern for pain/discomfort Mother noted concern for pain, discomfort -> this is possible and could lead to increased BP/HR.   Morphine prn Continue apap, prn  Fever  Serratia Pneumonia  Hospital Acquired Pneumonia:  -Tracheal aspirate from 2/25 and 3/1 with serratia - cefepime 2/27-3/5 -There is also concern for central fever, s/p buspirone - being treated for periorbital cellulitis as noted below  Acute hypoxemic respiratory failure secondary to CVA -S/P Trach -continue on trach collar -pulm continuing to follow for trach ->changed to cuffless trach on 3/11 per PCCM -Currently on 5 L  Dysphagia -s/p peg on tube feeds, seems to continue to tolerate well  Possible bacterial sinusitis -abx for below - CT head 3/9 with persistent subtotal opacification of paranasal sinuses and mastoid air cells, bubbly lucency at base of R maxillary sinus (likely odontogenic in origin)  Bacterial Conjunctivitis /periorbital cellulitis - MRI 3/7 orbits with L periorbital cellulitis  - CT maxillofacial today 3/12 - improving cellulitis  - s/p erythromycin ointment - continue ancef   Anasarca -bilateral upper extremity edema, minimal LE edema - suspect related to prolonged hospitalization, hypoalbuminemia -upper extremity US-> age indeterminate SVT of right cephalic vein  Elevated LFT's - rising,  continue to monitor - hold statin - follow - w/u further as indicated  Leukocytosis -Resolved on antibiotics  Anemia -Stable, follow  DVT prophylaxis: lovenox Code Status: full  Family Communication: husband 3/13 Disposition:   Status is: Inpatient  Remains inpatient appropriate because:Inpatient level of care appropriate due to severity of illness   Dispo: The patient is from: Home              Anticipated d/c is to: epnding              Patient currently is not medically stable to d/c.   Difficult to place patient Yes   Consultants:   Palliative care  Pulm  Neurology  Procedures:   Trach/peg EEG IMPRESSION: This study is suggestive of cortical dysfunction in right hemisphere likely secondaru to underlyig bleed as well as severe diffuse encephalopathy, nonspecific etiology. No seizures or epileptiform discharges were seen throughout the recording.  Echo IMPRESSIONS    1. Left ventricular ejection fraction, by estimation, is 60 to 65%. The  left ventricle has normal function. The left ventricle has no regional  wall motion abnormalities. There is mild concentric left ventricular  hypertrophy. Left ventricular diastolic  parameters were normal.  2. Right ventricular systolic function is normal. The right ventricular  size is normal. There is normal pulmonary artery systolic pressure.  3. The mitral valve is normal in structure. Trivial mitral valve  regurgitation. No evidence of mitral stenosis.  4. The aortic valve is tricuspid. Aortic valve regurgitation is not  visualized. No aortic stenosis is present.  5. The inferior vena cava is normal in size with <50% respiratory  variability, suggesting right atrial pressure of 8 mmHg.   Conclusion(s)/Recommendation(s): Normal biventricular function without  evidence of hemodynamically significant valvular heart disease. No  intracardiac source of embolism  detected on this transthoracic study. Tressa Maldonado   transesophageal echocardiogram is  recommended to exclude cardiac source of embolism if clinically indicated.   Antimicrobials:  Anti-infectives (From admission, onward)   Start     Dose/Rate Route Frequency Ordered Stop   09/21/20 1545  ceFAZolin (ANCEF) IVPB 1 g/50 mL premix        1 g 100 mL/hr over 30 Minutes Intravenous Every 8 hours 09/21/20 1457 09/28/20 1359   09/20/20 0600  ceFAZolin (ANCEF) IVPB 1 g/50 mL premix  Status:  Discontinued        1 g 100 mL/hr over 30 Minutes Intravenous Every 8 hours 09/14/20 0903 09/14/20 0904   09/16/20 1300  vancomycin (VANCOREADY) IVPB 1250 mg/250 mL  Status:  Discontinued        1,250 mg 166.7 mL/hr over 90 Minutes Intravenous Every 24 hours 09/15/20 1232 09/17/20 0956   09/15/20 1230  vancomycin (VANCOREADY) IVPB 1250 mg/250 mL        1,250 mg 166.7 mL/hr over 90 Minutes Intravenous  Once 09/15/20 1131 09/15/20 1402   09/13/20 1400  ceFEPIme (MAXIPIME) 2 g in sodium chloride 0.9 % 100 mL IVPB        2 g 200 mL/hr over 30 Minutes Intravenous Every 8 hours 09/13/20 1341 09/19/20 2208   09/06/20 0600  vancomycin (VANCOREADY) IVPB 1250 mg/250 mL        1,250 mg 166.7 mL/hr over 90 Minutes Intravenous  Once 09/06/20 0541 09/06/20 0739   09/05/20 1400  ceFAZolin (ANCEF) IVPB 1 g/50 mL premix  Status:  Discontinued        1 g 100 mL/hr over 30 Minutes Intravenous Every 8 hours 09/05/20 1120 09/13/20 1341   08/30/20 0600  vancomycin (VANCOREADY) IVPB 500 mg/100 mL  Status:  Discontinued        500 mg 100 mL/hr over 60 Minutes Intravenous Every 12 hours 08/29/20 1554 09/01/20 1115   08/29/20 1700  ceFEPIme (MAXIPIME) 2 g in sodium chloride 0.9 % 100 mL IVPB  Status:  Discontinued        2 g 200 mL/hr over 30 Minutes Intravenous Every 12 hours 08/29/20 1554 09/01/20 1115   08/29/20 1645  vancomycin (VANCOREADY) IVPB 1250 mg/250 mL        1,250 mg 166.7 mL/hr over 90 Minutes Intravenous  Once 08/29/20 1554 08/29/20 1820      Subjective: unresponsive  Objective: Vitals:   09/27/20 1528 09/27/20 1600 09/27/20 1646 09/27/20 1800  BP: 126/76  (!) 184/100 (!) 166/90  Pulse:  (!) 106  (!) 114  Resp:  19  20  Temp: 98.6 F (37 C)     TempSrc: Axillary     SpO2:  98%  95%  Weight:      Height:        Intake/Output Summary (Last 24 hours) at 09/27/2020 1843 Last data filed at 09/27/2020 1745 Gross per 24 hour  Intake --  Output 1100 ml  Net -1100 ml   Filed Weights   09/23/20 0418 09/24/20 0010 09/25/20 0353  Weight: 67 kg 60.5 kg 66.5 kg    Examination:  General: No acute distress. Cardiovascular: Heart sounds show Charlene [name] regular rate, and rhythm. Lungs: Clear to auscultation bilaterally - trach Abdomen: Soft, nontender, nondistended - peg Neurological: withdraws from painful stimuli all 4 extremities - unresponsive  Extremities: bilateral upper extremity edema  Data Reviewed: I have personally reviewed following labs and imaging studies  CBC: Recent Labs  Lab 09/23/20 0341 09/24/20 0343  09/25/20 0331 09/26/20 0217 09/27/20 0315  WBC 9.9 8.9 6.9 8.4 8.6  NEUTROABS  --  6.2 4.4 6.1 6.1  HGB 9.0* 8.9* 8.1* 9.0* 8.6*  HCT 28.6* 27.6* 26.2* 28.7* 27.1*  MCV 81.5 81.2 82.4 81.5 81.9  PLT 412* 398 347 375 034    Basic Metabolic Panel: Recent Labs  Lab 09/23/20 0341 09/24/20 0343 09/25/20 0331 09/26/20 0217 09/27/20 0315  NA 140 141 139 140 141  K 3.7 3.8 3.8 3.7 3.9  CL 103 101 101 102 103  CO2 $Re'25 30 27 30 31  'LPy$ GLUCOSE 114* 123* 120* 110* 94  BUN 19 21* $Remo'20 19 20  'hGvaR$ CREATININE 0.77 0.82 0.73 0.71 0.82  CALCIUM 9.1 9.1 8.8* 9.2 9.1  MG  --  2.0 1.9 2.0 2.0  PHOS  --  4.7* 4.9* 4.3 4.7*    GFR: Estimated Creatinine Clearance: 71.4 mL/min (by C-G formula based on SCr of 0.82 mg/dL).  Liver Function Tests: Recent Labs  Lab 09/23/20 0341 09/24/20 0343 09/25/20 0331 09/26/20 0217 09/27/20 0315  AST 79* 69* 61* 68* 103*  ALT 74* 67* 58* 65* 93*  ALKPHOS 112 112 98 109 109   BILITOT 0.4 0.4 0.6 0.2* 0.4  PROT 6.8 6.9 6.5 7.0 6.7  ALBUMIN 2.2* 2.2* 2.2* 2.3* 2.3*    CBG: Recent Labs  Lab 09/26/20 2343 09/27/20 0415 09/27/20 0716 09/27/20 1123 09/27/20 1526  GLUCAP 157* 127* 113* 116* 116*     No results found for this or any previous visit (from the past 240 hour(s)).       Radiology Studies: CT MAXILLOFACIAL W CONTRAST  Result Date: 09/26/2020 CLINICAL DATA:  Cellulitis.  Abnormal CT scan. EXAM: CT MAXILLOFACIAL WITH CONTRAST TECHNIQUE: Multidetector CT imaging of the maxillofacial structures was performed with intravenous contrast. Multiplanar CT image reconstructions were also generated. CONTRAST:  17mL OMNIPAQUE IOHEXOL 300 MG/ML  SOLN COMPARISON:  Head CT 09/23/2020.  Orbit MRI 09/21/2020. FINDINGS: Osseous: No acute fracture. Prominent periapical erosions involving maxillary and mandibular molar teeth bilaterally. Orbits: The globes appear intact. No postseptal orbital inflammation or mass is identified. Sinuses: Persistent circumferential mucosal thickening and fluid in the maxillary sinuses resulting in complete opacification on the left and subtotal opacification on the right, unchanged from the prior MRI. Unchanged complete opacification of the left sphenoid sinus, likely by combination of mucosal thickening and fluid. Layering fluid in the right sphenoid sinus. Persistent near complete opacification of the left ethmoid air cells and left frontal sinus. Soft tissues: Mild-to-moderate left periorbital soft tissue edema, mildly improved from the prior MRI. Mild-to-moderate edema throughout the subcutaneous soft tissues of the left face more inferiorly and posteriorly, also decreased compared to the imaged portions on the prior MRI. No fluid collection. Subcentimeter nodule in the right thyroid lobe for which no imaging follow-up is recommended. Limited intracranial: Low-density in the right thalamic region corresponding to the previously demonstrated  hemorrhage. IMPRESSION: 1. Decreased left periorbital and left facial soft tissue edema suggestive of improving cellulitis. No abscess. 2. Persistent pansinusitis. 3. Poor dentition. Electronically Signed   By: Logan Bores M.D.   On: 09/26/2020 15:10        Scheduled Meds: .  stroke: mapping our early stages of recovery book   Does not apply Once  . atorvastatin  40 mg Per Tube Daily  . baclofen  5 mg Per Tube QHS  . chlorhexidine gluconate (MEDLINE KIT)  15 mL Mouth Rinse BID  . Chlorhexidine Gluconate Cloth  6 each Topical  Daily  . enoxaparin (LOVENOX) injection  40 mg Subcutaneous Q24H  . feeding supplement (PROSource TF)  45 mL Per Tube Daily  . free water  100 mL Per Tube Q4H  . hydrALAZINE  25 mg Per Tube Q8H  . insulin aspart  0-9 Units Subcutaneous Q4H  . mouth rinse  15 mL Mouth Rinse 10 times per day  . metoprolol tartrate  75 mg Per Tube BID  . pantoprazole sodium  40 mg Per Tube BID  . scopolamine  1 patch Transdermal Q72H   Continuous Infusions: . sodium chloride 10 mL/hr at 09/27/20 0528  .  ceFAZolin (ANCEF) IV 1 g (09/27/20 1439)  . feeding supplement (OSMOLITE 1.2 CAL) 1,000 mL (09/27/20 0035)     LOS: 31 days    Time spent: over 30 min    Fayrene Helper, MD [facility]   To contact the attending provider between 7A-7P or the covering provider during after hours 7P-7A, please log into the web site [email or web address] and access using universal [place] password for that web site. If you do not have the password, please call the hospital operator.  09/27/2020, 6:43 PM

## 2020-09-28 LAB — COMPREHENSIVE METABOLIC PANEL
ALT: 75 U/L — ABNORMAL HIGH (ref 0–44)
AST: 71 U/L — ABNORMAL HIGH (ref 15–41)
Albumin: 2.3 g/dL — ABNORMAL LOW (ref 3.5–5.0)
Alkaline Phosphatase: 107 U/L (ref 38–126)
Anion gap: 9 (ref 5–15)
BUN: 20 mg/dL (ref 6–20)
CO2: 29 mmol/L (ref 22–32)
Calcium: 9.2 mg/dL (ref 8.9–10.3)
Chloride: 103 mmol/L (ref 98–111)
Creatinine, Ser: 0.7 mg/dL (ref 0.44–1.00)
GFR, Estimated: >60 mL/min (ref >60)
Glucose, Bld: 106 mg/dL — ABNORMAL HIGH (ref 70–99)
Potassium: 3.7 mmol/L (ref 3.5–5.1)
Sodium: 141 mmol/L (ref 135–145)
Total Bilirubin: 0.1 mg/dL — ABNORMAL LOW (ref 0.3–1.2)
Total Protein: 6.8 g/dL (ref 6.5–8.1)

## 2020-09-28 LAB — PROTIME-INR
INR: 1.2 (ref 0.8–1.2)
Prothrombin Time: 14.4 seconds (ref 11.4–15.2)

## 2020-09-28 LAB — CBC WITH DIFFERENTIAL/PLATELET
Abs Immature Granulocytes: 0.02 10*3/uL (ref 0.00–0.07)
Basophils Absolute: 0.1 10*3/uL (ref 0.0–0.1)
Basophils Relative: 1 %
Eosinophils Absolute: 0.1 10*3/uL (ref 0.0–0.5)
Eosinophils Relative: 2 %
HCT: 29.5 % — ABNORMAL LOW (ref 36.0–46.0)
Hemoglobin: 9.3 g/dL — ABNORMAL LOW (ref 12.0–15.0)
Immature Granulocytes: 0 %
Lymphocytes Relative: 25 %
Lymphs Abs: 1.7 10*3/uL (ref 0.7–4.0)
MCH: 25.8 pg — ABNORMAL LOW (ref 26.0–34.0)
MCHC: 31.5 g/dL (ref 30.0–36.0)
MCV: 81.7 fL (ref 80.0–100.0)
Monocytes Absolute: 0.5 10*3/uL (ref 0.1–1.0)
Monocytes Relative: 7 %
Neutro Abs: 4.5 10*3/uL (ref 1.7–7.7)
Neutrophils Relative %: 65 %
Platelets: 344 10*3/uL (ref 150–400)
RBC: 3.61 MIL/uL — ABNORMAL LOW (ref 3.87–5.11)
RDW: 17.2 % — ABNORMAL HIGH (ref 11.5–15.5)
WBC: 6.9 10*3/uL (ref 4.0–10.5)
nRBC: 0 % (ref 0.0–0.2)

## 2020-09-28 LAB — GLUCOSE, CAPILLARY
Glucose-Capillary: 105 mg/dL — ABNORMAL HIGH (ref 70–99)
Glucose-Capillary: 115 mg/dL — ABNORMAL HIGH (ref 70–99)
Glucose-Capillary: 127 mg/dL — ABNORMAL HIGH (ref 70–99)
Glucose-Capillary: 132 mg/dL — ABNORMAL HIGH (ref 70–99)
Glucose-Capillary: 144 mg/dL — ABNORMAL HIGH (ref 70–99)
Glucose-Capillary: 149 mg/dL — ABNORMAL HIGH (ref 70–99)

## 2020-09-28 LAB — PHOSPHORUS: Phosphorus: 4.9 mg/dL — ABNORMAL HIGH (ref 2.5–4.6)

## 2020-09-28 LAB — MAGNESIUM: Magnesium: 1.9 mg/dL (ref 1.7–2.4)

## 2020-09-28 MED ORDER — AMLODIPINE BESYLATE 5 MG PO TABS
5.0000 mg | ORAL_TABLET | Freq: Every day | ORAL | Status: DC
  Administered 2020-09-28 – 2020-10-01 (×4): 5 mg via ORAL
  Filled 2020-09-28 (×4): qty 1

## 2020-09-28 MED ORDER — METOPROLOL TARTRATE 25 MG/10 ML ORAL SUSPENSION
100.0000 mg | Freq: Two times a day (BID) | ORAL | Status: DC
  Administered 2020-09-28 – 2020-10-18 (×41): 100 mg
  Filled 2020-09-28 (×42): qty 40

## 2020-09-28 MED ORDER — ATORVASTATIN CALCIUM 40 MG PO TABS
40.0000 mg | ORAL_TABLET | Freq: Every day | ORAL | Status: DC
  Administered 2020-09-28 – 2021-04-16 (×201): 40 mg
  Filled 2020-09-28 (×125): qty 1
  Filled 2020-09-28: qty 4
  Filled 2020-09-28 (×77): qty 1

## 2020-09-28 NOTE — Progress Notes (Signed)
Occupational Therapy Re-Eval  Patient Details Name: Charlene [name] MRN: [id number] DOB: Dec 04, 1965 Today's Date: 09/28/2020    History of present illness Pt is 55 yo female [facility] on 2/[facility] with L sided weakness and headaches. PMH includes anxiety, HTN. 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 s/p Right frontal ventricular catheter placement on 2/10, stopped functioning on 2/11.  MRI 2/11 shows right greater than left mesial temporal lobes / parahippocampal cortex, bilateral basal ganglia, and possibly the upper pons that are concerning for acute infarcts, most likely secondary to mass effect from the hemorrhage. ETT 2/10-present.  Pt with trach and PEG on 09/10/20. Hospital course complicated by persistent fever, Serratia pneumonia and now pre-orbital cellulitis.   OT comments  Patient supine in bed with mother at side.  MD requested OT re-consult for contracture prevention.  Patient presenting with non-functional movement of UEs, spontaneously moving UEs per mother "when awake".  Patient withdrawling moving UEs (R >L) to touch/ROM, increased tone noted in L UE compared to R UE.  OT to follow for resting hand splint to L UE, positioning and ROM education for family.  Ordered splint today with MD order.    Follow Up Recommendations  SNF;Supervision/Assistance - 24 hour    Equipment Recommendations  Other (comment) (TBD)    Recommendations for Other Services      Precautions / Restrictions Precautions Precautions: Fall;Other (comment) Precaution Comments: watch BP and HR, peg and trach Restrictions Weight Bearing Restrictions: No       Mobility Bed Mobility               General bed mobility comments: total assist to reposition in bed, utilized pillows to reposition head closer to midline    Transfers                  General transfer comment: unable    Balance                                           ADL either performed or assessed with clinical judgement   ADL                                         General ADL Comments: total A     Vision   Additional Comments: eyes closed throughout session   Perception     Praxis      Cognition     Overall Cognitive Status: Difficult to assess                                 General Comments: remains unresponsive, withdrawls to UE ROM/touching R>L UE        Exercises Other Exercises Other Exercises: gentle passive ROM to BUEs   Shoulder Instructions       General Comments minimal rotation to neck, L head turn preference with tone noted.    Pertinent Vitals/ Pain       Pain Assessment: Faces Pain Location: withdrawls to discomfort R UE> L UE Pain Descriptors / Indicators: Discomfort Pain Intervention(s): Limited activity within patient's tolerance;Monitored during session;Repositioned  Home Living Family/patient expects to be discharged to:: Skilled nursing facility                                        Prior Functioning/Environment              Frequency  Min 2X/week        Progress Toward Goals  OT Goals(current goals can now be found in the care plan section)     Acute Rehab OT Goals Patient Stated Goal: pt unable, non responsive OT Goal Formulation: Patient unable to participate in goal setting Time For Goal Achievement: 10/12/20 Potential to Achieve Goals: Fair  Plan      Co-evaluation                 AM-PAC OT "6 Clicks" Daily Activity     Outcome Measure   Help from another person eating meals?: Total Help from another person taking care of personal grooming?: Total Help from another person toileting, which includes using toliet, bedpan, or urinal?: Total Help from another person bathing (including washing, rinsing, drying)?:  Total Help from another person to put on and taking off regular upper body clothing?: Total Help from another person to put on and taking off regular lower body clothing?: Total 6 Click Score: 6    End of Session    OT Visit Diagnosis: Other symptoms and signs involving the nervous system (R29.898);Other symptoms and signs involving cognitive function;Other (comment) (positioning/splinting) Symptoms and signs involving cognitive functions: Other Nontraumatic ICH Hemiplegia - caused by: Other Nontraumatic intracranial hemorrhage   Activity Tolerance Patient limited by lethargy   Patient Left in bed;with bed alarm set;with call bell/phone within reach   Nurse Communication          Time: 1610-9604 OT Time Calculation (min): 17 min  Charges: OT General Charges $OT Visit: 1 Visit OT Evaluation $OT Re-eval: 1 Re-eval  Barry Brunner, OT Acute Rehabilitation Services Pager [phone] Office [phone]    [name] 09/28/2020, 3:41 PM

## 2020-09-28 NOTE — Progress Notes (Signed)
PROGRESS NOTE    Charlene [name]  UDJ:[id number] DOB: 05-Feb-1966 DOA: 08/26/2020 PCP: Patient, [name]   Chief Complaint  Patient presents with  . Code Stroke   Brief Narrative:  55 yo F with 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 Subsequently on 2/10, she developed obtundation and respiratory arrest leading to intubation.  She has remained persistently obtunded, now has Charlene [name] trach and Charlene [name] PEG.  Hospital course complicated by persistent fever, Serratia pneumonia and now pre-orbital cellulitis  2/09 Admit for ICH 2/10 EVD placed, later intubated due to obtundation and respiratory arrest 2/11 MRI Brain, hypertonic saline d/c'd, EVD not working. 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 Intraventricular penetration with blood filling the 3rd and forth ventricles and nearly filling the right lateral ventricle. MRI 2/11 with intraparenchymal hemorrhage centered within the right thalamus and basal ganglia extending into the right midbrain with intraventricular extension. Effacement of prepontine cistern and similar 4 mm of leftward midline shift. Separate areas of ristricted diffusion and edema involving the right greater than left mesial temporal lobes / parahippocampal cortex, bilateral basal ganglia, and possibly the upper pons that are  concerning for acute infarcts, most likely 2/2 mass effect from the hemorrhage (see report)/ Per neuro "scattered b/l hippocampus, posterior CC, bilateral thalamus, and upper pons, likely due to deep penetrating vessel compression by focal mass-effect".  2/19 MRI with acute to early subacute infarcts in the bilateral cerebral white matter and right cerebellum, new from 2/11 - interval evolution of multiple other infarcts present on prior MRI, increased hemorrhage along old right EVD tract (see report) - 2/21 head CT with expected evolution of ICH since 2/13 - echo with normal EF, no intracardiac source of embolism - CTA head/neck without intracranial arterial occlusion or high grade stenosis, no dissection, aneurysm, or hemodynamically significant stenosis of the carotid or vertebral arteries - head CT 3/9 with expected evolution of hemorrhage involving the right thalamus, basal ganglia, and adjacent white matter from prior exam with developing encephalomalacia.  Resolved previous intraventricular hemorrhage.  Persistent sulcal effacement with mild improvement.  Persistent 3 mm left to right midline shift.  Multiple areas of low attenuation consistent with infarcts within the central white matter, unchanged in distribution from prior.  (see report) - per neurology- see last follow up note from 2/25 - Charlene [name] likely 2/2 hypertension and nonadherence to meds - s/p trach/peg on 2/24 - no antithrombotic 2/2 ICH -for obstructive hydrocephalus -> s/p external ventricular catheter placement on 08/27/20 by neurosurgery - EVD clamped on 2/12 and d/c'd on 2/13 -EEG suggestive of cortical dysfunction in right hemisphere likely 2/2 underlying bleed as well as severe diffuse encephalopathy, nonspecific etiology  -only withdraws to painful stimuli - will need to continue goals of care/palliative, family are still hopeful for recovery.  -seizure precautions, aspiration precautions, nutrition, bowel regimen -No significant  improvement in her mental status  Neck Contracture  Pt  with head tilted to L, (concern that this may affect trach per PCCM) Start on baclofen, uptitrate as tolerated - continue PT/OT  Hypertensive emergency -Home medications include Coreg and HCTZ - increase metoprolol, start hydralazine - add amlodipine - follow, fluctuating - adjust prn  -hydral and labetalol prn -SBP goal remains < 160  Sinus Tachycardia Follow on metop, persistent and relatively intermittent, possibly related to intermittent fevers (central fevers) Continue to monitor, workup as indicated Echo with EF 60-65% (see report)  Concern for pain/discomfort Mother noted concern for pain, discomfort -> this is possible and could lead to increased BP/HR.   Morphine prn Continue apap, prn  Fever  Serratia Pneumonia  Hospital Acquired Pneumonia:  -Tracheal aspirate from 2/25 and 3/1 with serratia - cefepime 2/27-3/5 -There is also concern for central fever, s/p buspirone - s/p treatment for periorbital cellulitis as noted below  Acute hypoxemic respiratory failure secondary to CVA -S/P Trach -continue on trach collar -pulm continuing to follow for trach ->changed to cuffless trach on 3/11 per PCCM -Currently on 5 L  Dysphagia -s/p peg on tube feeds, seems to continue to tolerate well  Possible bacterial sinusitis -abx for below - CT head 3/9 with persistent subtotal opacification of paranasal sinuses and mastoid air cells, bubbly lucency at base of R maxillary sinus (likely odontogenic in origin)  Bacterial Conjunctivitis /periorbital cellulitis - MRI 3/7 orbits with L periorbital cellulitis  - CT maxillofacial 3/12 - improving cellulitis  - s/p erythromycin ointment - completed course of ancef   Anasarca -bilateral upper extremity edema, minimal LE edema - suspect related to prolonged hospitalization, hypoalbuminemia -upper extremity US-> age indeterminate SVT of right cephalic vein  Elevated  LFT's - fluctuating, continue to monitor - follow - w/u further as indicated - continue statin for now  Leukocytosis -Resolved on antibiotics  Anemia -Stable, follow  DVT prophylaxis: lovenox Code Status: full  Family Communication: mother 3/14 Disposition:   Status is: Inpatient  Remains inpatient appropriate because:Inpatient level of care appropriate due to severity of illness   Dispo: The patient is from: Home              Anticipated d/c is to: epnding              Patient currently is not medically stable to d/c.   Difficult to place patient Yes   Consultants:   Palliative care  Pulm  Neurology  Procedures:   Trach/peg EEG IMPRESSION: This study is suggestive of cortical dysfunction in right hemisphere likely secondaru to underlyig bleed as well as severe diffuse encephalopathy, nonspecific etiology. No seizures or epileptiform discharges were seen throughout the recording.  Echo IMPRESSIONS    1. Left ventricular ejection fraction, by estimation, is 60 to 65%. The  left ventricle has normal function. The left ventricle has no regional  wall motion abnormalities. There is mild concentric left ventricular  hypertrophy. Left ventricular diastolic  parameters were normal.  2. Right ventricular systolic function is normal. The right ventricular  size is normal. There is normal pulmonary artery systolic pressure.  3. The mitral valve is normal in structure. Trivial mitral valve  regurgitation. No evidence of mitral stenosis.  4. The aortic valve is tricuspid. Aortic valve regurgitation is not  visualized. No aortic stenosis is present.  5. The inferior vena cava is normal in size with <50% respiratory  variability, suggesting right atrial pressure of 8 mmHg.   Conclusion(s)/Recommendation(s): Normal biventricular function without  evidence of hemodynamically significant valvular heart disease.  No  intracardiac source of embolism detected on this  transthoracic study. Hermelinda Diegel  transesophageal echocardiogram is  recommended to exclude cardiac source of embolism if clinically indicated.   Antimicrobials:  Anti-infectives (From admission, onward)   Start     Dose/Rate Route Frequency Ordered Stop   09/21/20 1545  ceFAZolin (ANCEF) IVPB 1 g/50 mL premix        1 g 100 mL/hr over 30 Minutes Intravenous Every 8 hours 09/21/20 1457 09/28/20 0647   09/20/20 0600  ceFAZolin (ANCEF) IVPB 1 g/50 mL premix  Status:  Discontinued        1 g 100 mL/hr over 30 Minutes Intravenous Every 8 hours 09/14/20 0903 09/14/20 0904   09/16/20 1300  vancomycin (VANCOREADY) IVPB 1250 mg/250 mL  Status:  Discontinued        1,250 mg 166.7 mL/hr over 90 Minutes Intravenous Every 24 hours 09/15/20 1232 09/17/20 0956   09/15/20 1230  vancomycin (VANCOREADY) IVPB 1250 mg/250 mL        1,250 mg 166.7 mL/hr over 90 Minutes Intravenous  Once 09/15/20 1131 09/15/20 1402   09/13/20 1400  ceFEPIme (MAXIPIME) 2 g in sodium chloride 0.9 % 100 mL IVPB        2 g 200 mL/hr over 30 Minutes Intravenous Every 8 hours 09/13/20 1341 09/19/20 2208   09/06/20 0600  vancomycin (VANCOREADY) IVPB 1250 mg/250 mL        1,250 mg 166.7 mL/hr over 90 Minutes Intravenous  Once 09/06/20 0541 09/06/20 0739   09/05/20 1400  ceFAZolin (ANCEF) IVPB 1 g/50 mL premix  Status:  Discontinued        1 g 100 mL/hr over 30 Minutes Intravenous Every 8 hours 09/05/20 1120 09/13/20 1341   08/30/20 0600  vancomycin (VANCOREADY) IVPB 500 mg/100 mL  Status:  Discontinued        500 mg 100 mL/hr over 60 Minutes Intravenous Every 12 hours 08/29/20 1554 09/01/20 1115   08/29/20 1700  ceFEPIme (MAXIPIME) 2 g in sodium chloride 0.9 % 100 mL IVPB  Status:  Discontinued        2 g 200 mL/hr over 30 Minutes Intravenous Every 12 hours 08/29/20 1554 09/01/20 1115   08/29/20 1645  vancomycin (VANCOREADY) IVPB 1250 mg/250 mL        1,250 mg 166.7 mL/hr over 90 Minutes Intravenous  Once 08/29/20 1554 08/29/20 1820      Subjective: unresponsive  Objective: Vitals:   09/28/20 0900 09/28/20 1154 09/28/20 1200 09/28/20 1521  BP:  136/80    Pulse: 98 94 96 91  Resp: $Remo'19 19 17 18  'OHPKY$ Temp: 100.1 F (37.8 C) 98.3 F (36.8 C)    TempSrc: Axillary Oral    SpO2: 98% 100% 100%   Weight:      Height:        Intake/Output Summary (Last 24 hours) at 09/28/2020 1710 Last data filed at 09/28/2020 1200 Gross per 24 hour  Intake --  Output 2100 ml  Net -2100 ml   Filed Weights   09/23/20 0418 09/24/20 0010 09/25/20 0353  Weight: 67 kg 60.5 kg 66.5 kg    Examination:  General: No acute distress. Cardiovascular: Heart sounds show Broghan Pannone regular rate, and rhythm Lungs: Clear to auscultation bilaterally - trach Abdomen: Soft, nontender, nondistended - peg. Neurological: unresponsive except to painful stimuli. Moves all extremities 4 to painful stimuli.  Skin: Warm and dry. No rashes or lesions. Extremities: No clubbing or cyanosis. No edema.    Data  Reviewed: I have personally reviewed following labs and imaging studies  CBC: Recent Labs  Lab 09/24/20 0343 09/25/20 0331 09/26/20 0217 09/27/20 0315 09/28/20 0151  WBC 8.9 6.9 8.4 8.6 6.9  NEUTROABS 6.2 4.4 6.1 6.1 4.5  HGB 8.9* 8.1* 9.0* 8.6* 9.3*  HCT 27.6* 26.2* 28.7* 27.1* 29.5*  MCV 81.2 82.4 81.5 81.9 81.7  PLT 398 347 375 352 101    Basic Metabolic Panel: Recent Labs  Lab 09/24/20 0343 09/25/20 0331 09/26/20 0217 09/27/20 0315 09/28/20 0151  NA 141 139 140 141 141  K 3.8 3.8 3.7 3.9 3.7  CL 101 101 102 103 103  CO2 $Re'30 27 30 31 29  'oMg$ GLUCOSE 123* 120* 110* 94 106*  BUN 21* $Remov'20 19 20 20  'QjzsEv$ CREATININE 0.82 0.73 0.71 0.82 0.70  CALCIUM 9.1 8.8* 9.2 9.1 9.2  MG 2.0 1.9 2.0 2.0 1.9  PHOS 4.7* 4.9* 4.3 4.7* 4.9*    GFR: Estimated Creatinine Clearance: 73.2 mL/min (by C-G formula based on SCr of 0.7 mg/dL).  Liver Function Tests: Recent Labs  Lab 09/24/20 0343 09/25/20 0331 09/26/20 0217 09/27/20 0315 09/28/20 0151  AST 69* 61*  68* 103* 71*  ALT 67* 58* 65* 93* 75*  ALKPHOS 112 98 109 109 107  BILITOT 0.4 0.6 0.2* 0.4 0.1*  PROT 6.9 6.5 7.0 6.7 6.8  ALBUMIN 2.2* 2.2* 2.3* 2.3* 2.3*    CBG: Recent Labs  Lab 09/27/20 2310 09/28/20 0346 09/28/20 0815 09/28/20 1156 09/28/20 1613  GLUCAP 128* 115* 144* 132* 105*     No results found for this or any previous visit (from the past 240 hour(s)).       Radiology Studies: No results found.      Scheduled Meds: .  stroke: mapping our early stages of recovery book   Does not apply Once  . baclofen  5 mg Per Tube QHS  . chlorhexidine gluconate (MEDLINE KIT)  15 mL Mouth Rinse BID  . Chlorhexidine Gluconate Cloth  6 each Topical Daily  . enoxaparin (LOVENOX) injection  40 mg Subcutaneous Q24H  . feeding supplement (PROSource TF)  45 mL Per Tube Daily  . free water  100 mL Per Tube Q4H  . hydrALAZINE  50 mg Per Tube Q8H  . insulin aspart  0-9 Units Subcutaneous Q4H  . mouth rinse  15 mL Mouth Rinse 10 times per day  . metoprolol tartrate  100 mg Per Tube BID  . pantoprazole sodium  40 mg Per Tube BID  . scopolamine  1 patch Transdermal Q72H   Continuous Infusions: . sodium chloride 10 mL/hr at 09/27/20 0528  . feeding supplement (OSMOLITE 1.2 CAL) 1,000 mL (09/27/20 0035)     LOS: 32 days    Time spent: over 30 min    Fayrene Helper, MD [facility]   To contact the attending provider between 7A-7P or the covering provider during after hours 7P-7A, please log into the web site [email or web address] and access using universal [place] password for that web site. If you do not have the password, please call the hospital operator.  09/28/2020, 5:10 PM

## 2020-09-28 NOTE — Progress Notes (Signed)
Orthopedic Tech Progress Note Patient Details:  Charlene [name] October 20, 1965 [id number] Called in order to [employer] for a RESTING HAND SPLINT Patient ID: Charlene [name], female   DOB: 07-07-1966, 55 y.o.   MRN: [id number]   Donald Pore 09/28/2020, 3:46 PM

## 2020-09-29 DIAGNOSIS — S069X9A Unspecified intracranial injury with loss of consciousness of unspecified duration, initial encounter: Secondary | ICD-10-CM

## 2020-09-29 DIAGNOSIS — S069X9S Unspecified intracranial injury with loss of consciousness of unspecified duration, sequela: Secondary | ICD-10-CM

## 2020-09-29 DIAGNOSIS — I612 Nontraumatic intracerebral hemorrhage in hemisphere, unspecified: Secondary | ICD-10-CM

## 2020-09-29 DIAGNOSIS — M6289 Other specified disorders of muscle: Secondary | ICD-10-CM | POA: Diagnosis not present

## 2020-09-29 DIAGNOSIS — R1312 Dysphagia, oropharyngeal phase: Secondary | ICD-10-CM | POA: Diagnosis not present

## 2020-09-29 LAB — GLUCOSE, CAPILLARY
Glucose-Capillary: 105 mg/dL — ABNORMAL HIGH (ref 70–99)
Glucose-Capillary: 111 mg/dL — ABNORMAL HIGH (ref 70–99)
Glucose-Capillary: 115 mg/dL — ABNORMAL HIGH (ref 70–99)
Glucose-Capillary: 119 mg/dL — ABNORMAL HIGH (ref 70–99)
Glucose-Capillary: 130 mg/dL — ABNORMAL HIGH (ref 70–99)
Glucose-Capillary: 153 mg/dL — ABNORMAL HIGH (ref 70–99)

## 2020-09-29 LAB — COMPREHENSIVE METABOLIC PANEL
ALT: 59 U/L — ABNORMAL HIGH (ref 0–44)
AST: 55 U/L — ABNORMAL HIGH (ref 15–41)
Albumin: 2.3 g/dL — ABNORMAL LOW (ref 3.5–5.0)
Alkaline Phosphatase: 100 U/L (ref 38–126)
Anion gap: 8 (ref 5–15)
BUN: 22 mg/dL — ABNORMAL HIGH (ref 6–20)
CO2: 28 mmol/L (ref 22–32)
Calcium: 9.1 mg/dL (ref 8.9–10.3)
Chloride: 102 mmol/L (ref 98–111)
Creatinine, Ser: 0.77 mg/dL (ref 0.44–1.00)
GFR, Estimated: >60 mL/min (ref >60)
Glucose, Bld: 127 mg/dL — ABNORMAL HIGH (ref 70–99)
Potassium: 3.6 mmol/L (ref 3.5–5.1)
Sodium: 138 mmol/L (ref 135–145)
Total Bilirubin: 0.5 mg/dL (ref 0.3–1.2)
Total Protein: 6.5 g/dL (ref 6.5–8.1)

## 2020-09-29 LAB — CBC WITH DIFFERENTIAL/PLATELET
Abs Immature Granulocytes: 0.02 10*3/uL (ref 0.00–0.07)
Basophils Absolute: 0 10*3/uL (ref 0.0–0.1)
Basophils Relative: 0 %
Eosinophils Absolute: 0.1 10*3/uL (ref 0.0–0.5)
Eosinophils Relative: 1 %
HCT: 27 % — ABNORMAL LOW (ref 36.0–46.0)
Hemoglobin: 8.7 g/dL — ABNORMAL LOW (ref 12.0–15.0)
Immature Granulocytes: 0 %
Lymphocytes Relative: 19 %
Lymphs Abs: 1.7 10*3/uL (ref 0.7–4.0)
MCH: 26.1 pg (ref 26.0–34.0)
MCHC: 32.2 g/dL (ref 30.0–36.0)
MCV: 81.1 fL (ref 80.0–100.0)
Monocytes Absolute: 0.6 10*3/uL (ref 0.1–1.0)
Monocytes Relative: 7 %
Neutro Abs: 6.5 10*3/uL (ref 1.7–7.7)
Neutrophils Relative %: 73 %
Platelets: 339 10*3/uL (ref 150–400)
RBC: 3.33 MIL/uL — ABNORMAL LOW (ref 3.87–5.11)
RDW: 17.4 % — ABNORMAL HIGH (ref 11.5–15.5)
WBC: 9 10*3/uL (ref 4.0–10.5)
nRBC: 0 % (ref 0.0–0.2)

## 2020-09-29 LAB — MAGNESIUM: Magnesium: 1.9 mg/dL (ref 1.7–2.4)

## 2020-09-29 LAB — PHOSPHORUS: Phosphorus: 4.9 mg/dL — ABNORMAL HIGH (ref 2.5–4.6)

## 2020-09-29 MED ORDER — BACLOFEN 10 MG PO TABS
5.0000 mg | ORAL_TABLET | Freq: Two times a day (BID) | ORAL | Status: DC
  Administered 2020-09-29 – 2020-10-08 (×19): 5 mg
  Filled 2020-09-29 (×19): qty 1

## 2020-09-29 MED ORDER — HYDRALAZINE HCL 20 MG/ML IJ SOLN
5.0000 mg | INTRAMUSCULAR | Status: DC | PRN
  Administered 2020-10-14 – 2020-11-02 (×6): 5 mg via INTRAVENOUS
  Filled 2020-09-29 (×7): qty 1

## 2020-09-29 NOTE — Progress Notes (Signed)
OT treatment:   Patient seen with PT.  Maintained L eye open throughout most of session, closing with fatigue.  Patient tolerated sitting EOB with total assist with vitals stable, but unable to achieve ROM of head/neck at EOB due to increased tone.  R sidelying with gentle prolonged stretch, gravity assisted ROM to neck to achieve neutral but with any discomfort pt automatically turning head back to L side.  Donned resting hand splint to L hand, 2 hours, with no redness or indentations--plan to increase wear time and educated mother on schedule, management next session. Will follow acutely.      09/29/20 1500  OT Visit Information  Last OT Received On 09/29/20  Assistance Needed +1 (2+ mobility)  PT/OT/SLP Co-Evaluation/Treatment Yes  Reason for Co-Treatment Complexity of the patient's impairments (multi-system involvement);To address functional/ADL transfers;For patient/therapist safety  OT goals addressed during session Strengthening/ROM  History of Present Illness Pt is 55 yo female presents [facility]9 with L sided weakness and headaches. PMH includes anxiety, HTN. 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 s/p Right frontal ventricular catheter placement on 2/10, stopped functioning on 2/11.  MRI 2/11 shows right greater than left mesial temporal lobes / parahippocampal cortex, bilateral basal ganglia, and possibly the upper pons that are concerning for acute infarcts, most likely secondary to mass effect from the hemorrhage. ETT 2/10-present.  Pt with trach and PEG on 09/10/20. Hospital course complicated by persistent fever, Serratia pneumonia and now pre-orbital cellulitis.  Precautions  Precautions Fall;Other (comment)  Precaution Comments watch BP and HR, peg and trach  Pain Assessment  Pain Assessment Faces  Faces Pain Scale 4  Pain Location  withdrawls to discomfort R UE> L UE  Pain Descriptors / Indicators Discomfort  Pain Intervention(s) Limited activity within patient's tolerance;Monitored during session;Repositioned  Cognition  Overall Cognitive Status Difficult to assess  General Comments remains unresponsive, eyes open today during parts of session.  Continues to withdrawl to UE movement when uncomfortable.  Difficult to assess due to Level of arousal  ADL  General ADL Comments total A  Bed Mobility  Overal bed mobility Needs Assistance  Bed Mobility Rolling;Supine to Sit;Sit to Supine  Rolling Total assist;+2 for physical assistance;+2 for safety/equipment  Supine to sit Total assist;+2 for physical assistance  Sit to supine Total assist;+2 for physical assistance  General bed mobility comments used bedpad to transition to/from EOB with total assist +2, returned to sidelying by on R side in hopes to relax neck and utilize gravity for increased neck ROM with total assist +2  Balance  Overall balance assessment Needs assistance  Sitting-balance support No upper extremity supported;Feet supported  Sitting balance-Leahy Scale Zero  Sitting balance - Comments total assist to sit EOB  Postural control Posterior lean  Standing balance comment unable  Restrictions  Weight Bearing Restrictions No  Vision- Assessment  Additional Comments eyes open intermittetly, does not blink or follow stimuli  Transfers  General transfer comment unable  General Comments  General comments (skin integrity, edema, etc.) attempted neck rotation at EOB, but tone limiting ROM; in sidelying neck ROM when relaxed utilizing gravity to relax head towards R side but with any stimuli or discomfort tenses back to L rotation  Other Exercises  Other Exercises initated use of resting hand splint to L UE, wear time x 2 hours with no redness or indentations, plan to  teach mother use of splint and increase wear time  OT - End of Session  Activity Tolerance  Patient tolerated treatment well  Patient left in bed;with call bell/phone within reach;with bed alarm set;with family/visitor present  Nurse Communication Mobility status  OT Assessment/Plan  OT Plan Discharge plan remains appropriate;Frequency remains appropriate  OT Visit Diagnosis Other symptoms and signs involving the nervous system (R29.898);Other symptoms and signs involving cognitive function;Other (comment) (positioning/splinting)  Symptoms and signs involving cognitive functions Other Nontraumatic ICH  Hemiplegia - caused by Other Nontraumatic intracranial hemorrhage  OT Frequency (ACUTE ONLY) Min 2X/week  Follow Up Recommendations SNF;Supervision/Assistance - 24 hour  OT Equipment Other (comment) (TBD)  AM-PAC OT "6 Clicks" Daily Activity Outcome Measure (Version 2)  Help from another person eating meals? 1  Help from another person taking care of personal grooming? 1  Help from another person toileting, which includes using toliet, bedpan, or urinal? 1  Help from another person bathing (including washing, rinsing, drying)? 1  Help from another person to put on and taking off regular upper body clothing? 1  Help from another person to put on and taking off regular lower body clothing? 1  6 Click Score 6  OT Goal Progression  Progress towards OT goals Progressing toward goals  Acute Rehab OT Goals  Patient Stated Goal pt unable, non responsive  OT Goal Formulation Patient unable to participate in goal setting  OT Time Calculation  OT Start Time (ACUTE ONLY) 1332  OT Stop Time (ACUTE ONLY) 1405  OT Time Calculation (min) 33 min  OT General Charges  $OT Visit 1 Visit  OT Treatments  $Therapeutic Activity 8-22 mins   Barry Brunner, OT Acute Rehabilitation Services Pager [phone] Office [phone]

## 2020-09-29 NOTE — Progress Notes (Signed)
NAME:  Charlene [name], MRN:  [id number], DOB:  1965/10/22, LOS: 33 ADMISSION DATE:  08/26/2020, CONSULTATION DATE:  2/10 REFERRING MD:  Dr. Roda Shutters, CHIEF COMPLAINT:  ICH   Brief History:  55 year old female admitted with hypertensive R thalamic/basal ganglia ICH. Resultant obstructive hydrocephalus prompting EVD placement 2/10. Subsequently, 2/10 she had acute mental status change to obtundation and respiratory arrest resulting in intubation.  Past Medical History:  HTN, medication noncompliance  Significant Hospital Events:  2/09 Admit for ICH 2/10 EVD placed, later intubated due to obtundation and respiratory arrest 2/11 MRI Brain, hypertonic saline d/c'd, EVD not working.  Husband did not consent to PICC  2/13 EVD removed 2/17 GOC discussion with family, PMT 3/11 trach changed to Shiley 6 uncuffed  Consults:  Stroke PCCM Neurosurgery  Procedures:  ETT 2/10 >> EVD 2/10 > non functioning 2/11 at 1500; clamped 2/12 am > 2/13 Trach and diagnostic Bronchoscopy 2/24>> Dr. Denese Killings PEG placement 2/24>> Dr. Sheliah Hatch  Significant Diagnostic Tests:  CT Head 2/9 >> Acute hemorrhage with the epicenter in the right thalamus. Measurement is approximately 3.2 x 2.1 x 2.5 cm (volume 8.8 cm^3). Intraventricular penetration with blood filling the third and fourth ventricles and nearly filling the right lateral ventricle. No hydrocephalus. CTA Head/Neck 2/10 >> Unchanged size of intraparenchymal hemorrhage centered in the right thalamus and basal ganglia with extension into the right lateral ventricle. 2. No intracranial arterial occlusion or high-grade stenosis. No dissection, aneurysm or hemodynamically significant stenosis of the carotid or vertebral arteries. CT Head 2/10 >> Stable parenchymal hemorrhage in the right thalamus and basal ganglia with intraventricular extension as described. No new focal abnormality is noted. CT Head 2/10 (post intubation) >> redemonstrated intraparenchymal  hemorrhage centered within the R thalamus/basal ganglia extending into the cerebral peduncle/midbrain with associated intraventricular extension and hemorrhage in the R lateral ventricle and fourth ventricle, overall not significantly changed from prior; stable mass effect, 66mm of R to L midline shift, postsurgical changes of ventriculostomy TTE 2/10 >> LVEF 60 to 65%, LV has normal function, no regional wall motion abnormalities, mild concentric LVH.  RV systolic function is normal, normal pulmonary artery systolic pressure.  MRA head 2/11 >>Negative intracranial MR angiography. No evidence of aneurysm or high flow vascular malformation to explain the intraparenchymal hemorrhage  MRI brain 2/11 >> Similar intraparenchymal hemorrhage centered within the right thalamus and basal ganglia extending into the right midbrain with intraventricular extension, effacement of the prepontine cistern and similar 4 mm of leftward midline shift; separate areas of restricted diffusion and edema involving the R > L mesial temporal lobes/parahippocampal cortex, bilateral basal ganglia, and possibly the upper pons that are c/f acute infarcts, most likely secondary to mass effect from hemorrhage. MRI brain 2/19 >> intraventricular clot has decreased in size but mid-brain parenchymal clot has not and there are more ischemic infarcts in the surrounding brain.  CT Head WO Contrast 09/07/20>> Expected evolution of intracranial hemorrhage since 08/30/2020 head CT. Multiple areas of low attenuation corresponding to some of the infarcts seen on prior MRI. Ventricle caliber similar to prior MRI   Micro Data:  2/9 COVID + Influenza negative 2/10 MRSA PCR negative 2/12 BCx 2 > no growth  2/19 BC 1/2 +Stap EPI, 1/2 no growth   Antimicrobials:  Cefepime 2/12 > 2/15 Vanc 2/12 > 2/15  Interim History / Subjective:  No distress  Objective   Blood pressure 106/70, pulse 85, temperature 98.5 F (36.9 C), temperature source  Axillary, resp. rate 19, height 5'  5" (1.651 m), weight 66.8 kg, last menstrual period 03/03/2014, SpO2 99 %, unknown if currently breastfeeding.    FiO2 (%):  [21 %] 21 %   Intake/Output Summary (Last 24 hours) at 09/29/2020 0823 Last data filed at 09/29/2020 6606 Gross per 24 hour  Intake -  Output 1350 ml  Net -1350 ml   Filed Weights   09/24/20 0010 09/25/20 0353 09/29/20 0500  Weight: 60.5 kg 66.5 kg 66.8 kg   General:  Middle aged female in NAD Neuro:  Withdrawals to pain HEENT:  Trach in place, site CDI, [place]/AT, No JVD noted, PERRL Cardiovascular:  RRR, no MRG Lungs:  Clear bilateral breath sounds Abdomen:  Soft, non-distended, non-tender Musculoskeletal:  No acute deformity Skin:  Intact, MMM  Resolved Hospital Problem list   Hypernatremia  Obstructive hydrocephalus (resolved) -Hypertensive emergency -Acute hypoxemic respiratory failure secondary to CVA Assessment & Plan:   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 Possibly a candidate for further downsize in the future, but will not be a candidate for decannulation.  - Routine trach care - Trach change every 12 weeks.     Joneen Roach, AGACNP-BC [facility]  See Amion for personal pager PCCM on call pager [phone] until 7pm. Please call Elink 7p-7a. [phone]  09/29/2020 8:27 AM

## 2020-09-29 NOTE — Progress Notes (Signed)
[facility] PROGRESS NOTE  KATISHA SHIMIZU OMA:[id number] DOB: Dec 04, 1965 DOA: 08/26/2020 PCP: Patient, [name]  Status: Remains inpatient appropriate because:Altered mental status, Unsafe d/c plan, IV treatments appropriate due to intensity of illness or inability to take PO and Inpatient level of care appropriate due to severity of illness   Dispo: The patient is from: Home              Anticipated d/c is to: SNF              Patient currently is not medically stable to d/c.   Difficult to place patient Yes   Level of care: Progressive  Code Status: Full Family Communication: Mother at bedside on 3/15 DVT prophylaxis: SCDs Vaccination status: Unknown  Foley catheter: No-Purewick  HPI: 55 yo F with 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 Subsequently on 2/10, she developed obtundation and respiratory arrest leading to intubation. She has remained persistently obtunded, now has a trach and a PEG. Hospital course complicated by persistent fever,Serratia pneumoniaand now pre-orbital cellulitis  2/09 Admit for ICH 2/10 EVD placed, later intubated due to obtundation and respiratory arrest 2/11 MRI Brain, hypertonic saline d/c'd, EVD not working. Husband did not consent to PICC  2/13 EVD removed 2/17 [place] discussion with family, PMT 2/24 Bedside trach and PEG 3/1 Transferred to [facility]  Subjective: Patient opens eyes to voice and tactile stimulation.  Asked patient to blink once for yes and twice for no.  Asked her a question that would require a yes answer and she blinked once upon command.  Unable to move extremities upon command.  Objective: Vitals:   09/29/20 0336 09/29/20 0400  BP: (!) 161/99 107/68  Pulse: (!) 101 92  Resp: (!) 25 (!) 21  Temp: 98.9 F (37.2 C)   SpO2: 97% 98%    Intake/Output Summary (Last 24 hours) at 09/29/2020 0749 Last data filed at 09/29/2020  1423 Gross per 24 hour  Intake --  Output 1650 ml  Net -1650 ml   Filed Weights   09/24/20 0010 09/25/20 0353 09/29/20 0500  Weight: 60.5 kg 66.5 kg 66.8 kg    Exam:  Constitutional: Awakens, flat affect in regards to underlying brain injury, no acute distress ENT: More easily opens left eye.  Bilateral blink reflex in place.  Tracks with left eye.  Patient unable to manage oral secretions and frequently has clear drooling from left side of mouth Respiratory: #6.0 cuffless trach, trach collar with FiO2 21%,with O2 sats 100% bilateral lung sounds coarse to auscultation with a few expiratory rhonchi and right mid fields.. No accessory muscle use.  Cardiovascular: Regular rate and rhythm, no murmurs / rubs / gallops.  Trace bilateral lower extremity edema. 2+ pedal pulses.  Abdomen: no tenderness, no masses palpated. No hepatosplenomegaly. Bowel sounds positive. PEG with tube feedings.  LBM 3/14 Musculoskeletal: Patient has issues with diffuse hypertonicity without any associated spasticity.  Has torticollis involving the neck with persistent positioning of head towards the left.  Has not yet developed upper extremity contractures but does have issues with hypertonicity as well as grimacing with attempts to flex and extend extremities with PROM.  Similar issues with lower extremities. Neurologic: CN 2-12 intact although may have some mild upper lid ptosis on the right.  As noted above has bilateral blink reflex.  And able to track with left eye.  Some minimal spontaneous movement of lower extremities but is not meaningful.  Does  have significant hypertonicity without spasticity of upper and lower extremities.  Unable to adequately test strength. Psychiatric: Awakens inconsistently.  Nonverbal therefore unable to accurately assess orientation.   Assessment/Plan: Acute problems: Multiple acute strokes Right Thalamus ICH with IVH  Obstructive Hydrocephalus/Acute on persistent metabolic  Encephalopathy/brain injury related hypertonicity -CT 2/9 with acute hemorrhage with epicenter in the right thalamus. Intraventricular penetration with blood filling the 3rd and forth ventricles and nearly filling the right lateral ventricle.  -MRI 2/11 with intraparenchymal hemorrhage centered within the right thalamus and basal ganglia extending into the right midbrain with intraventricular extension. Effacement of prepontine cistern and similar 4 mm of leftward midline shift. Separate areas of ristricted diffusion and edema involving the right greater than left mesial temporal lobes / parahippocampal cortex, bilateral basal ganglia, and possibly the upper pons that are concerning for acute infarcts, most likely 2/2 mass effect from the hemorrhage (see report)/ Per neuro "scattered b/l hippocampus, posterior CC, bilateral thalamus, and upper pons, likely due to deep penetrating vessel compression by focal mass-effect".  -2/19 MRI with acute to early subacute infarcts in the bilateral cerebral white matter and right cerebellum, new from 2/11 - interval evolution of multiple other infarcts present on prior MRI, increased hemorrhage along old right EVD tract (see report) - 2/21 head CT with expected evolution of ICH since 2/13 - echo with normal EF, no intracardiac source of embolism - CTA head/neck without intracranial arterial occlusion or high grade stenosis, no dissection, aneurysm, or hemodynamically significant stenosis of the carotid or vertebral arteries - head CT 3/9 with expected evolution of hemorrhage involving the right thalamus, basal ganglia, and adjacent white matter from prior exam with developing encephalomalacia.  Resolved previous intraventricular hemorrhage.  Persistent sulcal effacement with mild improvement.  Persistent 3 mm left to right midline shift.  Multiple areas of low attenuation consistent with infarcts within the central white matter, unchanged in distribution from prior.   (see report) - per neurology- see last follow up note from 2/25 - [place] likely 2/2 hypertension and nonadherence to meds  - s/p trach/peg on 2/24  - no antithrombotic 2/2 ICH -for obstructive hydrocephalus -> s/p external ventricular catheter placement on 08/27/20 by neurosurgery - EVD clamped on 2/12 and d/c'd on 2/13 -EEG suggestive of cortical dysfunction in right hemisphere likely 2/2 underlying bleed as well as severe diffuse encephalopathy, nonspecific etiology  -As of 3/15 I was able to get patient to open her left eye and make eye contact.  She briefly tracked my activity in the room.  She was able to blink to respond yes or no to questions asked as instructed. -Continues to have significant issues with neck torticollis as well as overall hypertonicity.  Will increase p.m. baclofen to twice daily.  Will uptitrate slowly given concerns over oversedation now that patient is slightly more awake.  May need to eventually involve rehab physician to assist with management of hypertonicity.  If baclofen alone ineffective can add other medications.  We will need to follow LFTs.  Patient concomitantly on statin which can also increase LFTs when combined with muscle relaxers.  Currently has mild transaminitis with AST 55 and ALT 59 with a normal total bilirubin. -seizure precautions, aspiration precautions, nutrition, bowel regimen -I have also instructed her mother on how to perform PROM to upper and lower extremities  Neck Contracture/torticollis -Pt with head tilted to L, (concern that this may affect trach per PCCM) -See above regarding increase in baclofen dosage on 3/15 -- continue PT/OT -May  need to discuss with rehab physician to determine if a candidate for Botox injection  Hypertensive emergency Resolved -Home medications include Coreg and HCTZ which have not been reordered -Continue metoprolol, hydralazine  and amlodipine - follow, fluctuating - adjust prn  -hydral and labetalol  prn -SBP goal remains < 160  Fever  Serratia Pneumonia  Hospital Acquired Pneumonia:  -Tracheal aspirate from 2/25 and 3/1 with serratia - cefepime 2/27-3/5 -There is also concern for central fever, s/p buspirone - s/p treatment for periorbital cellulitis as noted below  Acute hypoxemic respiratory failure secondary to CVA requiring mechanical ventilation and tracheostomy tube -Stable on trach collar and has been transitioned to a #6 cuffless trach -Has significant altered mentation and inability to manage oral secretions at present time is not a safe candidate for decannulation -Appreciate trach team assistance in management  Bacterial Conjunctivitis/periorbital cellulitis/ - MRI 3/7 orbits with L periorbital cellulitis  -- CT head 3/9 with persistent subtotal opacification of paranasal sinuses and mastoid air cells, bubbly lucency at base of R maxillary sinus (likely odontogenic in origin) - CT maxillofacial 3/12 - improving cellulitis  - s/p erythromycin ointment - completed course of ancef   Pain likely related to underlying hypertonicity -Mother noted concern for pain, discomfort -> this is possible and could lead to increased BP/HR.   -Morphine IV and Tylenol prn -Treat underlying hypertonicity with skeletal muscle relaxers and uptitrate as tolerated with goal to improve and resolve hypertonicity without contributing to oversedation  Dysphagia/Nutrition Status: Nutrition Problem: Inadequate oral intake Etiology: inability to eat Signs/Symptoms: NPO status Interventions: Tube feeding consisting of Osmolite 1.2 Cal at 55 cc/h; continue Prosource liquid 45 cc daily, free water 100 cc every 4 hours per tube; will also continue to follow CBGs regularly while on tube feedings and provide SSI Estimated body mass index is 24.51 kg/m as calculated from the following:   Height as of this encounter: '5\' 5"'  (1.651 m).   Weight as of this encounter: 66.8 kg.    Other  problems: Anasarca -bilateral upper extremity edema, minimal LE edema - suspect related to prolonged hospitalization, hypoalbuminemia -upper extremity US-> age indeterminate SVT of right cephalic vein  Elevated LFT's - fluctuating, continue to monitor - follow - w/u further as indicated - continue statin for now  Leukocytosis -Resolved on antibiotics  Anemia -Stable, follow  Sinus Tachycardia Follow on metop, persistent and relatively intermittent, possibly related to intermittent fevers (central fevers) Continue to monitor, workup as indicated Echo with EF 60-65% (see report)   Data Reviewed: Basic Metabolic Panel: Recent Labs  Lab 09/25/20 0331 09/26/20 0217 09/27/20 0315 09/28/20 0151 09/29/20 0223  NA 139 140 141 141 138  K 3.8 3.7 3.9 3.7 3.6  CL 101 102 103 103 102  CO2 '27 30 31 29 28  ' GLUCOSE 120* 110* 94 106* 127*  BUN '20 19 20 20 ' 22*  CREATININE 0.73 0.71 0.82 0.70 0.77  CALCIUM 8.8* 9.2 9.1 9.2 9.1  MG 1.9 2.0 2.0 1.9 1.9  PHOS 4.9* 4.3 4.7* 4.9* 4.9*   Liver Function Tests: Recent Labs  Lab 09/25/20 0331 09/26/20 0217 09/27/20 0315 09/28/20 0151 09/29/20 0223  AST 61* 68* 103* 71* 55*  ALT 58* 65* 93* 75* 59*  ALKPHOS 98 109 109 107 100  BILITOT 0.6 0.2* 0.4 0.1* 0.5  PROT 6.5 7.0 6.7 6.8 6.5  ALBUMIN 2.2* 2.3* 2.3* 2.3* 2.3*   No results for input(s): LIPASE, AMYLASE in the last 168 hours. No results for input(s): AMMONIA  in the last 168 hours. CBC: Recent Labs  Lab 09/25/20 0331 09/26/20 0217 09/27/20 0315 09/28/20 0151 09/29/20 0223  WBC 6.9 8.4 8.6 6.9 9.0  NEUTROABS 4.4 6.1 6.1 4.5 6.5  HGB 8.1* 9.0* 8.6* 9.3* 8.7*  HCT 26.2* 28.7* 27.1* 29.5* 27.0*  MCV 82.4 81.5 81.9 81.7 81.1  PLT 347 375 352 344 339   Cardiac Enzymes: No results for input(s): CKTOTAL, CKMB, CKMBINDEX, TROPONINI in the last 168 hours. BNP (last 3 results) No results for input(s): BNP in the last 8760 hours.  ProBNP (last 3 results) No results for  input(s): PROBNP in the last 8760 hours.  CBG: Recent Labs  Lab 09/28/20 1156 09/28/20 1613 09/28/20 1952 09/28/20 2354 09/29/20 0340  GLUCAP 132* 105* 127* 149* 119*    No results found for this or any previous visit (from the past 240 hour(s)).   Studies: No results found.  Scheduled Meds: .  stroke: mapping our early stages of recovery book   Does not apply Once  . amLODipine  5 mg Oral Daily  . atorvastatin  40 mg Per Tube Daily  . baclofen  5 mg Per Tube QHS  . chlorhexidine gluconate (MEDLINE KIT)  15 mL Mouth Rinse BID  . Chlorhexidine Gluconate Cloth  6 each Topical Daily  . enoxaparin (LOVENOX) injection  40 mg Subcutaneous Q24H  . feeding supplement (PROSource TF)  45 mL Per Tube Daily  . free water  100 mL Per Tube Q4H  . hydrALAZINE  50 mg Per Tube Q8H  . insulin aspart  0-9 Units Subcutaneous Q4H  . mouth rinse  15 mL Mouth Rinse 10 times per day  . metoprolol tartrate  100 mg Per Tube BID  . pantoprazole sodium  40 mg Per Tube BID  . scopolamine  1 patch Transdermal Q72H   Continuous Infusions: . sodium chloride 10 mL/hr at 09/27/20 0528  . feeding supplement (OSMOLITE 1.2 CAL) 1,000 mL (09/28/20 1759)    Principal Problem:   ICH (intracerebral hemorrhage) (HCC) Active Problems:   Intraparenchymal hemorrhage of brain (HCC)   Acute hypoxemic respiratory failure (HCC)   Fever   Endotracheal tube present   Status post tracheostomy [facility])   Palliative care by specialist   Consultants:  Palliative care  Pulm  Neurology   Procedures:  Tracheostomy  PEG tube  EEG  Echocardiogram  Antibiotics: Anti-infectives (From admission, onward)   Start     Dose/Rate Route Frequency Ordered Stop   09/21/20 1545  ceFAZolin (ANCEF) IVPB 1 g/50 mL premix        1 g 100 mL/hr over 30 Minutes Intravenous Every 8 hours 09/21/20 1457 09/28/20 0647   09/20/20 0600  ceFAZolin (ANCEF) IVPB 1 g/50 mL premix  Status:  Discontinued        1 g 100 mL/hr over  30 Minutes Intravenous Every 8 hours 09/14/20 0903 09/14/20 0904   09/16/20 1300  vancomycin (VANCOREADY) IVPB 1250 mg/250 mL  Status:  Discontinued        1,250 mg 166.7 mL/hr over 90 Minutes Intravenous Every 24 hours 09/15/20 1232 09/17/20 0956   09/15/20 1230  vancomycin (VANCOREADY) IVPB 1250 mg/250 mL        1,250 mg 166.7 mL/hr over 90 Minutes Intravenous  Once 09/15/20 1131 09/15/20 1402   09/13/20 1400  ceFEPIme (MAXIPIME) 2 g in sodium chloride 0.9 % 100 mL IVPB        2 g 200 mL/hr over 30 Minutes Intravenous Every 8 hours  09/13/20 1341 09/19/20 2208   09/06/20 0600  vancomycin (VANCOREADY) IVPB 1250 mg/250 mL        1,250 mg 166.7 mL/hr over 90 Minutes Intravenous  Once 09/06/20 0541 09/06/20 0739   09/05/20 1400  ceFAZolin (ANCEF) IVPB 1 g/50 mL premix  Status:  Discontinued        1 g 100 mL/hr over 30 Minutes Intravenous Every 8 hours 09/05/20 1120 09/13/20 1341   08/30/20 0600  vancomycin (VANCOREADY) IVPB 500 mg/100 mL  Status:  Discontinued        500 mg 100 mL/hr over 60 Minutes Intravenous Every 12 hours 08/29/20 1554 09/01/20 1115   08/29/20 1700  ceFEPIme (MAXIPIME) 2 g in sodium chloride 0.9 % 100 mL IVPB  Status:  Discontinued        2 g 200 mL/hr over 30 Minutes Intravenous Every 12 hours 08/29/20 1554 09/01/20 1115   08/29/20 1645  vancomycin (VANCOREADY) IVPB 1250 mg/250 mL        1,250 mg 166.7 mL/hr over 90 Minutes Intravenous  Once 08/29/20 1554 08/29/20 1820        Time spent: 35 minutes    Erin Hearing ANP  [facility] 7 am - 330 pm/M-F for direct patient care and secure chat Please refer to Amion for contact info 33  days

## 2020-09-29 NOTE — Progress Notes (Signed)
Physical Therapy Treatment Patient Details Name: Charlene [name] MRN: [id number] DOB: 08/03/1965 Today's Date: 09/29/2020    History of Present Illness Pt is 55 yo female presents to [facility] on 2/9 with L sided weakness and headaches. PMH includes anxiety, HTN. 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 s/p Right frontal ventricular catheter placement on 2/10, stopped functioning on 2/11.  MRI 2/11 shows right greater than left mesial temporal lobes / parahippocampal cortex, bilateral basal ganglia, and possibly the upper pons that are concerning for acute infarcts, most likely secondary to mass effect from the hemorrhage. ETT 2/10-present.  Pt with trach and PEG on 09/10/20. Hospital course complicated by persistent fever, Serratia pneumonia and now pre-orbital cellulitis.    PT Comments    Pt progressing in the sense that she maintained L eye open throughout session and tolerated PROM as well as sitting EOB in supported sitting without concerning elevation in HR and BP stable. Mother present and educated on PROM as well as positioning in R SL to promote stretching of L lateral neck flexors. Pt not intentionally responsive throughout treatment though. PT will continue to follow 1x/wk.     Follow Up Recommendations  SNF;Supervision/Assistance - 24 hour     Equipment Recommendations  Hospital bed    Recommendations for Other Services       Precautions / Restrictions Precautions Precautions: Fall Precaution Comments: watch BP and HR, peg and trach Restrictions Weight Bearing Restrictions: No    Mobility  Bed Mobility Overal bed mobility: Needs Assistance Bed Mobility: Rolling;Supine to Sit;Sit to Supine Rolling: Total assist   Supine to sit: Total assist;+2 for physical assistance Sit to supine: Total assist;+2 for physical assistance   General  bed mobility comments: total assist to reposition in bed, utilized pillows to reposition head closer to midline    Transfers                 General transfer comment: unable  Ambulation/Gait             General Gait Details: unable   Stairs             Wheelchair Mobility    Modified Rankin (Stroke Patients Only) Modified Rankin (Stroke Patients Only) Pre-Morbid Rankin Score: No symptoms Modified Rankin: Severe disability     Balance Overall balance assessment: Needs assistance Sitting-balance support: No upper extremity supported;Feet supported Sitting balance-Leahy Scale: Zero Sitting balance - Comments: maintained sitting >10 mins with stimulation to 4 extremities and face. Occasional posterior lean and max A needed to stay upright Postural control: Posterior lean                                  Cognition Arousal/Alertness: Awake/alert Behavior During Therapy: Flat affect Overall Cognitive Status: Difficult to assess                                 General Comments: remains unresponsive, withdraws to UE ROM/touching R>L UE and feet when touched on plantar surface      Exercises General Exercises - Lower Extremity Ankle Circles/Pumps: PROM;Both;10 reps;Supine Long Arc Quad: PROM;Both;5 reps;Seated Heel Slides: PROM;Both;5 reps;Supine Hip ABduction/ADduction: PROM;Supine (prolonged stretch to adductors) Other Exercises Other Exercises: mother educated on safe PROM to LE's  in bed Other Exercises: mother educated on positioning to improve cervical position (R SL) Other Exercises: pt held in R SL 10 mins with OT working on stretching left lateral neck    General Comments General comments (skin integrity, edema, etc.): pt kept L eye open throughout session, noted beating of eye to L, no focus noted and no reaction to stimuli      Pertinent Vitals/Pain Pain Assessment: Faces Faces Pain Scale: Hurts little more Pain  Location: withdrawls to discomfort R UE> L UE Pain Descriptors / Indicators: Discomfort Pain Intervention(s): Monitored during session    Home Living                      Prior Function            PT Goals (current goals can now be found in the care plan section) Acute Rehab PT Goals Patient Stated Goal: pt unable, non responsive PT Goal Formulation: Patient unable to participate in goal setting Time For Goal Achievement: 10/06/20 Potential to Achieve Goals: Poor Progress towards PT goals: Progressing toward goals    Frequency    Min 1X/week      PT Plan Current plan remains appropriate    Co-evaluation PT/OT/SLP Co-Evaluation/Treatment: Yes Reason for Co-Treatment: Complexity of the patient's impairments (multi-system involvement);Necessary to address cognition/behavior during functional activity;For patient/therapist safety PT goals addressed during session: Mobility/safety with mobility;Strengthening/ROM        AM-PAC PT "6 Clicks" Mobility   Outcome Measure  Help needed turning from your back to your side while in a flat bed without using bedrails?: Total Help needed moving from lying on your back to sitting on the side of a flat bed without using bedrails?: Total Help needed moving to and from a bed to a chair (including a wheelchair)?: Total Help needed standing up from a chair using your arms (e.g., wheelchair or bedside chair)?: Total Help needed to walk in hospital room?: Total Help needed climbing 3-5 steps with a railing? : Total 6 Click Score: 6    End of Session Equipment Utilized During Treatment: Oxygen Activity Tolerance: Other (comment) (limited by pt responsiveness) Patient left: in bed;with call bell/phone within reach;with bed alarm set;with SCD's reapplied;with family/visitor present Nurse Communication: Mobility status PT Visit Diagnosis: Hemiplegia and hemiparesis;Other symptoms and signs involving the nervous system (R29.898);Other  abnormalities of gait and mobility (R26.89) Hemiplegia - dominant/non-dominant: Dominant Hemiplegia - caused by: Cerebral infarction;Nontraumatic intracerebral hemorrhage;Other Nontraumatic intracranial hemorrhage     Time: 6295-2841 PT Time Calculation (min) (ACUTE ONLY): 33 min  Charges:  $Therapeutic Activity: 8-22 mins                     Lyanne Co, PT  Acute Rehab Services  Pager [phone] Office [phone]    Lawana Chambers [place] 09/29/2020, 3:57 PM

## 2020-09-29 NOTE — Progress Notes (Signed)
Patient ID: Charlene [name], female   DOB: [birth year], 55 y.o.   MRN: [id number]  Medical records reviewed   55 year old female with past medical history as below, which is significant for uncontrolled  hypertension and reported medication noncompliance.  Presented to the emergency department at [facility] 2/9 with c/o headache and left-sided weakness.  STAT CT Head demonstrated right thalamic/basal ganglia hemorrhage with intraventricular extension.  She was transferred to [facility] emergency department for in person neurology evaluation. There was evidence of obstructive hydrocephalus and neurosurgery was consulted. External ventricular drain was placed 2/10. In the hours following she became increasingly obtunded. PCCM was consulted. Upon PCCM arrival the patient became apneic and was intubated. EVD removed 2/13     Family meeting held 09-03-20     Trach/PEG 09-10-20 Hospital course complicated by persistent fever,Serratia pneumoniaand now pre-orbital cellulitis   Patient remains unresponsive,  unable to follow commands, with extensor posturing, unresponsive to gentle touch and verbal stimuli.  Nursing reports response to painful stimuli  Likely long term poor prognosis for meaningful recovery.  No family at bedside.  Discussed case with Dr Lowell Guitar.    PMT will continue to support holistically and support family as needed  Total time spent on the unit was 15  minutes    Greater than 50% of the time was spent in counseling and coordination of care   Lorinda Creed NP  Palliative Medicine Team Team Phone # [phone] Pager [phone]

## 2020-09-30 LAB — GLUCOSE, CAPILLARY
Glucose-Capillary: 110 mg/dL — ABNORMAL HIGH (ref 70–99)
Glucose-Capillary: 123 mg/dL — ABNORMAL HIGH (ref 70–99)
Glucose-Capillary: 130 mg/dL — ABNORMAL HIGH (ref 70–99)
Glucose-Capillary: 138 mg/dL — ABNORMAL HIGH (ref 70–99)
Glucose-Capillary: 150 mg/dL — ABNORMAL HIGH (ref 70–99)
Glucose-Capillary: 93 mg/dL (ref 70–99)

## 2020-09-30 NOTE — Progress Notes (Signed)
Occupational Therapy Treatment Patient Details Name: Charlene [name] MRN: [id number] DOB: 22-Feb-1966 Today's Date: 09/30/2020    History of present illness Pt is 55 yo female presents to [facility] on 2/9 with L sided weakness and headaches. PMH includes anxiety, HTN. 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 s/p Right frontal ventricular catheter placement on 2/10, stopped functioning on 2/11.  MRI 2/11 shows right greater than left mesial temporal lobes / parahippocampal cortex, bilateral basal ganglia, and possibly the upper pons that are concerning for acute infarcts, most likely secondary to mass effect from the hemorrhage. ETT 2/10-present.  Pt with trach and PEG on 09/10/20. Hospital course complicated by persistent fever, Serratia pneumonia and now pre-orbital cellulitis.   OT comments  Patient supine in bed, tolerated 4 hours of L resting hand splint today.  Daughter in law present during session.  Educated on use of/wear schedule of splint, demonstrated donning/doffing splint, L UE positioning, L UE stretching (to elbow and forearm) with good return demonstration.  Sign of wear schedule above bed.   RN STAFF  Please check splint every 4 hours during shift ( remove splint , remove stockinette/ dressing present) to assess for: * pain * redness *swelling  If any symptoms above present remove splint for 15 minutes. If symptoms continue - keep the splint removed and notify OT staff [phone] immediately.   Keep the UE elevated at all times on pillows / towels.  Splint can be cleaned with warm soapy water and alcohol swab. Splint should not be placed in heat of any kind because the splint with mold into a new shape.   WEAR SCHEDULE: 4-6 hours daily      Follow Up Recommendations  SNF;Supervision/Assistance - 24 hour    Equipment Recommendations   Other (comment) (TBD at next venue of care)    Recommendations for Other Services      Precautions / Restrictions Precautions Precautions: Fall Precaution Comments: watch BP and HR, peg and trach Restrictions Weight Bearing Restrictions: No       Mobility Bed Mobility                    Transfers                      Balance                                           ADL either performed or assessed with clinical judgement   ADL                                         General ADL Comments: total A     Vision       Perception     Praxis      Cognition Arousal/Alertness: Awake/alert Behavior During Therapy: Flat affect Overall Cognitive Status: Difficult to assess                                 General Comments: remains unresponsive, withdraws to UE ROM/touching R>L UE        Exercises Exercises:  Other exercises Other Exercises Other Exercises: daughter in law educated in PROM to L UE, positioning   Shoulder Instructions       General Comments Daughter in law present.  Educated on positoining of L UE into extension and abduction as able, use of resting hand splint to L UE- wear schedule and donning/doffing, and PROM/stretch to L elbow/forearm.  Daughter in law with good return demonstration and understanding.    Pertinent Vitals/ Pain       Pain Assessment: Faces Faces Pain Scale: Hurts little more Pain Location: withdrawls to discomfort R UE> L UE Pain Descriptors / Indicators: Discomfort Pain Intervention(s): Monitored during session  Home Living                                          Prior Functioning/Environment              Frequency  Min 2X/week        Progress Toward Goals  OT Goals(current goals can now be found in the care plan section)  Progress towards OT goals: Progressing toward goals  Acute Rehab OT Goals Patient Stated Goal: pt  unable, non responsive OT Goal Formulation: Patient unable to participate in goal setting  Plan Discharge plan remains appropriate;Frequency remains appropriate    Co-evaluation                 AM-PAC OT "6 Clicks" Daily Activity     Outcome Measure   Help from another person eating meals?: Total Help from another person taking care of personal grooming?: Total Help from another person toileting, which includes using toliet, bedpan, or urinal?: Total Help from another person bathing (including washing, rinsing, drying)?: Total Help from another person to put on and taking off regular upper body clothing?: Total Help from another person to put on and taking off regular lower body clothing?: Total 6 Click Score: 6    End of Session    OT Visit Diagnosis: Other symptoms and signs involving the nervous system (R29.898);Other symptoms and signs involving cognitive function;Other (comment) Symptoms and signs involving cognitive functions: Other Nontraumatic ICH Hemiplegia - caused by: Other Nontraumatic intracranial hemorrhage   Activity Tolerance Patient tolerated treatment well   Patient Left in bed;with call bell/phone within reach;with family/visitor present;with bed alarm set   Nurse Communication Other (comment) (splint schedule)        Time: 5027-7412 OT Time Calculation (min): 32 min  Charges: OT General Charges $OT Visit: 1 Visit OT Treatments $Therapeutic Activity: 8-22 mins $Orthotics Fit/Training: 8-22 mins  Barry Brunner, OT Acute Rehabilitation Services Pager [phone] Office [phone]    [name] 09/30/2020, 3:45 PM

## 2020-09-30 NOTE — Progress Notes (Signed)
PROGRESS NOTE  Charlene [name] SWF:[id number] DOB: [birth year] DOA: 08/26/2020 PCP: Patient, [name]   LOS: 34 days   Brief Narrative / Interim history: 55 yo F with 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  Subsequently on 2/10, she developed obtundation and respiratory arrest leading to intubation.  She has remained persistently obtunded, now has a trach and a PEG.  Hospital course complicated by persistent fever, Serratia pneumonia and now pre-orbital cellulitis  2/09 Admit for ICH 2/10 EVD placed, later intubated due to obtundation and respiratory arrest 2/11 MRI Brain, hypertonic saline d/c'd, EVD not working. Husband did not consent to PICC  2/13 EVD removed 2/17 [place] discussion with family, PMT 2/24 Bedside trach and PEG 3/1 Transferred to [place]  Subjective / 24h Interval events: Unresponsive   Assessment & Plan: Principal Problem Multiple acute strokes  Right Thalamus ICH with IVH  Obstructive Hydrocephalus Acute Metabolic Encephalopathy - CT 2/9 with acute hemorrhage with epicenter in the right thalamus.  Intraventricular penetration with blood filling the 3rd and forth ventricles and nearly filling the right lateral ventricle. MRI 2/11 with intraparenchymal hemorrhage centered within the right thalamus and basal ganglia extending into the right midbrain with intraventricular extension.  Effacement of prepontine cistern and similar 4 mm of leftward midline shift.  Separate areas of ristricted diffusion and edema involving the right greater than left mesial temporal lobes / parahippocampal cortex, bilateral basal ganglia, and possibly the upper pons that are concerning for acute infarcts, most likely 2/2 mass effect from the hemorrhage (see report)/ Per neuro "scattered b/l hippocampus, posterior CC, bilateral thalamus, and upper pons, likely due to deep penetrating vessel compression by focal  mass-effect".  2/19 MRI with acute to early subacute infarcts in the bilateral cerebral white matter and right cerebellum, new from 2/11 - interval evolution of multiple other infarcts present on prior MRI, increased hemorrhage along old right EVD tract (see report) - 2/21 head CT with expected evolution of ICH since 2/13 - echo with normal EF, no intracardiac source of embolism - CTA head/neck without intracranial arterial occlusion or high grade stenosis, no dissection, aneurysm, or hemodynamically significant stenosis of the carotid or vertebral arteries - per neurology - see last follow up note from 2/25 - ICH likely 2/2 hypertension and nonadherence to meds - s/p trach/peg on 2/24 - no antithrombotic 2/2 ICH -for obstructive hydrocephalus -> s/p external ventricular catheter placement on 08/27/20 by neurosurgery - EVD clamped on 2/12 and d/c'd on 2/13 -EEG suggestive of cortical dysfunction in right hemisphere likely 2/2 underlying bleed as well as severe diffuse encephalopathy, nonspecific etiology  -only withdraws to painful stimuli - will need to continue goals of care/palliative, on my discussion with the family they are still hopeful for recovery.  Discussed a little bit more with her mother at bedside today regarding what if the patient will never wake up and she shook her head no and does not consider this an option and is firmly convinced that her daughter will wake up -seizure precautions, aspiration precautions, nutrition, bowel regimen -No significant improvement in her mental status  Active Problems Hypertensive emergency -Home medications include Coreg and HCTZ -currently on metoprolol  -hydral and labetalol prn -SBP goal remains < 160  Neck Contracture  -Pt with head tilted to L, (concern that this may affect trach per PCCM) -Continue baclofen  Fever  Serratia Pneumonia  Hospital Acquired Pneumonia:  -Tracheal aspirate from 2/25 with serratia -There is  also concern for  central fever, started on buspirone -CXR with bilateral lower lobe opacities (L>R concerning for pneumonia). She is status post 2 courses of cefepime for her pneumonia.   Acute hypoxemic respiratory failure secondary to CVA -S/P Trach -continue on trach collar -pulm continuing to follow for trach -Currently on 5 L  Dysphagia -s/p peg on tube feeds, seems to continue to tolerate well  Possible bacterial sinusitis -Off antibiotics now  Sinus tachycardia -Overall improved on metoprolol  Bacterial Conjunctivitis /periorbital cellulitis -left eye had persistent upper eyelid swelling, she was initially placed on erythromycin ointment however the swelling has gotten worse, underwent an MRI of the orbits which showed left periorbital cellulitis without post septal extension.  Status post a course of Ancef  Anasarca -bilateral upper extremity edema, minimal LE edema - suspect related to prolonged hospitalization, hypoalbuminemia -upper extremity US -> age indeterminate SVT of right cephalic vein  Elevated LFT's -mild, continue to monitor overall stable  Leukocytosis -Resolved on antibiotics  Anemia -Stable, follow  Scheduled Meds: . amLODipine  5 mg Oral Daily  . atorvastatin  40 mg Per Tube Daily  . baclofen  5 mg Per Tube BID  . chlorhexidine gluconate (MEDLINE KIT)  15 mL Mouth Rinse BID  . Chlorhexidine Gluconate Cloth  6 each Topical Daily  . enoxaparin (LOVENOX) injection  40 mg Subcutaneous Q24H  . feeding supplement (PROSource TF)  45 mL Per Tube Daily  . free water  100 mL Per Tube Q4H  . hydrALAZINE  50 mg Per Tube Q8H  . insulin aspart  0-9 Units Subcutaneous Q4H  . mouth rinse  15 mL Mouth Rinse 10 times per day  . metoprolol tartrate  100 mg Per Tube BID  . pantoprazole sodium  40 mg Per Tube BID  . scopolamine  1 patch Transdermal Q72H   Continuous Infusions: . sodium chloride 10 mL/hr at 09/27/20 0528  . feeding supplement (OSMOLITE 1.2 CAL) 1,000 mL  (09/28/20 1759)   PRN Meds:.sodium chloride, acetaminophen **OR** acetaminophen (TYLENOL) oral liquid 160 mg/5 mL **OR** acetaminophen, hydrALAZINE, labetalol, morphine injection, oxyCODONE, polyethylene glycol, senna-docusate  Diet Orders (From admission, onward)    Start     Ordered   09/10/20 0001  Diet NPO time specified  Diet effective midnight        09/09/20 1421          DVT prophylaxis: enoxaparin (LOVENOX) injection 40 mg Start: 09/01/20 1600 SCD's Start: 08/27/20 0026     Code Status: Full Code  Family Communication: No family at bedside  Status is: Inpatient  Remains inpatient appropriate because:Inpatient level of care appropriate due to severity of illness   Dispo: The patient is from: Home              Anticipated d/c is to: SNF              Patient currently is not medically stable to d/c.   Difficult to place patient No   Level of care: Progressive  Consultants:  Palliative care Pulmonary  Neurology   Procedures:  Lurline Idol, PEG  Microbiology  None   Antimicrobials: Status post 2 courses of cefepime, status post 1 course of cefazolin   Objective: Vitals:   09/29/20 2311 09/30/20 0311 09/30/20 0445 09/30/20 0700  BP: 109/68 (!) 128/93  113/66  Pulse: 84 87  90  Resp: '19 18  18  ' Temp: 99 F (37.2 C) 98.9 F (37.2 C)  98.3 F (36.8 C)  TempSrc: Axillary  Axillary  Axillary  SpO2: 99% 100%  100%  Weight:   68.2 kg   Height:        Intake/Output Summary (Last 24 hours) at 09/30/2020 0903 Last data filed at 09/29/2020 1746 Gross per 24 hour  Intake -  Output 600 ml  Net -600 ml   Filed Weights   Oct 09, 2020 0353 09/29/20 0500 09/30/20 0445  Weight: 66.5 kg 66.8 kg 68.2 kg    Examination:  Constitutional: Poorly responsive Respiratory: Diminished at the bases but overall clear Cardiovascular: Regular rate and rhythm, no murmurs   Data Reviewed: I have independently reviewed following labs and imaging studies   CBC: Recent Labs   Lab 09-Oct-2020 0331 09/26/20 0217 09/27/20 0315 09/28/20 0151 09/29/20 0223  WBC 6.9 8.4 8.6 6.9 9.0  NEUTROABS 4.4 6.1 6.1 4.5 6.5  HGB 8.1* 9.0* 8.6* 9.3* 8.7*  HCT 26.2* 28.7* 27.1* 29.5* 27.0*  MCV 82.4 81.5 81.9 81.7 81.1  PLT 347 375 352 344 527   Basic Metabolic Panel: Recent Labs  Lab 10/09/2020 0331 09/26/20 0217 09/27/20 0315 09/28/20 0151 09/29/20 0223  NA 139 140 141 141 138  K 3.8 3.7 3.9 3.7 3.6  CL 101 102 103 103 102  CO2 '27 30 31 29 28  ' GLUCOSE 120* 110* 94 106* 127*  BUN '20 19 20 20 ' 22*  CREATININE 0.73 0.71 0.82 0.70 0.77  CALCIUM 8.8* 9.2 9.1 9.2 9.1  MG 1.9 2.0 2.0 1.9 1.9  PHOS 4.9* 4.3 4.7* 4.9* 4.9*   Liver Function Tests: Recent Labs  Lab 2020-10-09 0331 09/26/20 0217 09/27/20 0315 09/28/20 0151 09/29/20 0223  AST 61* 68* 103* 71* 55*  ALT 58* 65* 93* 75* 59*  ALKPHOS 98 109 109 107 100  BILITOT 0.6 0.2* 0.4 0.1* 0.5  PROT 6.5 7.0 6.7 6.8 6.5  ALBUMIN 2.2* 2.3* 2.3* 2.3* 2.3*   Coagulation Profile: Recent Labs  Lab 09/28/20 0151  INR 1.2   HbA1C: No results for input(s): HGBA1C in the last 72 hours. CBG: Recent Labs  Lab 09/29/20 1614 09/29/20 2000 09/29/20 2331 09/30/20 0353 09/30/20 0737  GLUCAP 111* 130* 115* 130* 93    No results found for this or any previous visit (from the past 240 hour(s)).   Radiology Studies: No results found. Marzetta Board, MD, PhD [facility]  Between 7 am - 7 pm I am available, please contact me via Amion or Securechat  Between 7 pm - 7 am I am not available, please contact night coverage MD/APP via Amion

## 2020-09-30 NOTE — Progress Notes (Addendum)
Nutrition Follow-up  DOCUMENTATION CODES:   Not applicable  INTERVENTION:   Continue Tube feeding via PEG tube: Osmolite 1.2 at 55 ml/h (1320 ml per day) 45 ml ProSource TF daily  Provides 1624 kcal, 84 gm protein, 1070 ml free water daily  100 ml free water every 4 hours    NUTRITION DIAGNOSIS:   Inadequate oral intake related to inability to eat as evidenced by NPO status. Ongoing.   GOAL:   Patient will meet greater than or equal to 90% of their needs Met with TF.   MONITOR:   TF tolerance,Vent status  REASON FOR ASSESSMENT:   Consult,Ventilator Enteral/tube feeding initiation and management  ASSESSMENT:   Pt with PMH of HTN with medication noncompliance admitted with hypertensive thalamic ICH.    2/10 s/p EVD placement and intubation for respiratory arrest 2/13 EVD removed 2/17 palliative care consult; family requesting tx to [facility]  2/24 s/p trach and PEG  3/1 tx to [facility] 3/11 trach changed to shiley #6 cuffless  CM and MSW are currently waiting on presentation of [place] paperwork at this time from the patient's daughter.    Pt continues to receive routine trach care with uncuffed Shiley #6 cuffless. Pt continues on ATC. Per CCM, would be okay to progress to [place] if pt were able to start communicating.   Note pt with positive culture for rare Serratia marcescens. Pt asymptomatic at this time; MD suspects culture is more reflective of colonization.   Pt remains unresponsive. Continues to receive TF via PEG. Current regimen: Osmolite 1.2 at 55 ml/h w/ 45 ml ProSource TF daily, 133m free water flushes Q4H. Tolerating well per RN.   UOP: 20043mx 24 hours  Admit wt: 61.2 kg Current wt: 62 kg  Medications: SSI Q4H, protonix Labs reviewed. CBGs 134-103-123   Diet Order:   Diet Order            Diet NPO time specified  Diet effective midnight                 EDUCATION NEEDS:   No education needs have been identified at this  time  Skin:  Skin Assessment: Reviewed RN Assessment  Last BM:  3/21 type 5  Height:   Ht Readings from Last 1 Encounters:  08/27/20 '5\' 5"'  (1.651 m)    Weight:   Wt Readings from Last 1 Encounters:  10/07/20 62 kg    Ideal Body Weight:  56.8 kg  BMI:  Body mass index is 22.75 kg/m.  Estimated Nutritional Needs:   Kcal:  1600-1800  Protein:  80-90 grams  Fluid:  >1.6L/d  AmLarkin InaMS, RD, LDN RD pager number and weekend/on-call pager number located in [place]

## 2020-09-30 NOTE — TOC Progression Note (Addendum)
Transition of Care [facility]) - Progression Note    Patient Details  Name: Charlene [name] MRN: [id number] Date of Birth: 1966-05-27  Transition of Care [facility]) CM/SW [place], RN Phone Number: 09/30/2020, 10:33 AM  Clinical Narrative:    Case management met at the bedside with the patient and the patient's mother regarding transitions of care.  The patient's mother states that the patient lives at home with 3 adult children, ages 32, 14, and 10 years old.  The patient currently has a trach collar at 5 liters/ min and existing PEG receiving tube feeds at this time.  The DTP Team assumed care of the patient in regards to transitions of care.   Expected Discharge Plan: Skilled Nursing Facility Barriers to Discharge: Continued Medical Work up  Expected Discharge Plan and Services Expected Discharge Plan: [place] In-house Referral: Clinical Social Work,Hospice / Psychologist, forensic Discharge Planning Services: CM Consult   Living arrangements for the past 2 months: Single Family Home                                       Social Determinants of Health (SDOH) Interventions    Readmission Risk Interventions Readmission Risk Prevention Plan 09/29/2020  Transportation Screening Complete  PCP or Specialist Appt within 3-5 Days Complete  HRI or [place] Complete  Social Work Consult for [place] Planning/Counseling Complete  Palliative Care Screening Complete  Medication Review Press photographer) Complete  Some recent data might be hidden

## 2020-10-01 LAB — GLUCOSE, CAPILLARY
Glucose-Capillary: 111 mg/dL — ABNORMAL HIGH (ref 70–99)
Glucose-Capillary: 115 mg/dL — ABNORMAL HIGH (ref 70–99)
Glucose-Capillary: 150 mg/dL — ABNORMAL HIGH (ref 70–99)
Glucose-Capillary: 170 mg/dL — ABNORMAL HIGH (ref 70–99)
Glucose-Capillary: 93 mg/dL (ref 70–99)
Glucose-Capillary: 98 mg/dL (ref 70–99)

## 2020-10-01 MED ORDER — AMLODIPINE BESYLATE 5 MG PO TABS
5.0000 mg | ORAL_TABLET | Freq: Every day | ORAL | Status: DC
  Administered 2020-10-02 – 2020-10-21 (×20): 5 mg
  Filled 2020-10-01 (×20): qty 1

## 2020-10-01 NOTE — Progress Notes (Signed)
PROGRESS NOTE  Charlene [name] OXB:[id number] DOB: Feb 23, 1966 DOA: 08/26/2020 PCP: Patient, [name]   LOS: 35 days   Brief Narrative / Interim history: 55 yo F with 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  Subsequently on 2/10, she developed obtundation and respiratory arrest leading to intubation.  She has remained persistently obtunded, now has a trach and a PEG.  Hospital course complicated by persistent fever, Serratia pneumonia and now pre-orbital cellulitis  2/09 Admit for ICH 2/10 EVD placed, later intubated due to obtundation and respiratory arrest 2/11 MRI Brain, hypertonic saline d/c'd, EVD not working. Husband did not consent to PICC  2/13 EVD removed 2/17 [place] discussion with family, PMT 2/24 Bedside trach and PEG 3/1 Transferred to [place]  Subjective / 24h Interval events: Unresponsive  Assessment & Plan: Principal Problem Multiple acute strokes  Right Thalamus ICH with IVH  Obstructive Hydrocephalus Acute Metabolic Encephalopathy - CT 2/9 with acute hemorrhage with epicenter in the right thalamus.  Intraventricular penetration with blood filling the 3rd and forth ventricles and nearly filling the right lateral ventricle. MRI 2/11 with intraparenchymal hemorrhage centered within the right thalamus and basal ganglia extending into the right midbrain with intraventricular extension.  Effacement of prepontine cistern and similar 4 mm of leftward midline shift.  Separate areas of ristricted diffusion and edema involving the right greater than left mesial temporal lobes / parahippocampal cortex, bilateral basal ganglia, and possibly the upper pons that are concerning for acute infarcts, most likely 2/2 mass effect from the hemorrhage (see report)/ Per neuro "scattered b/l hippocampus, posterior CC, bilateral thalamus, and upper pons, likely due to deep penetrating vessel compression by focal  mass-effect".  2/19 MRI with acute to early subacute infarcts in the bilateral cerebral white matter and right cerebellum, new from 2/11 - interval evolution of multiple other infarcts present on prior MRI, increased hemorrhage along old right EVD tract (see report) - 2/21 head CT with expected evolution of ICH since 2/13 - echo with normal EF, no intracardiac source of embolism - CTA head/neck without intracranial arterial occlusion or high grade stenosis, no dissection, aneurysm, or hemodynamically significant stenosis of the carotid or vertebral arteries - per neurology - see last follow up note from 2/25 - ICH likely 2/2 hypertension and nonadherence to meds - s/p trach/peg on 2/24 - no antithrombotic 2/2 ICH -for obstructive hydrocephalus -> s/p external ventricular catheter placement on 08/27/20 by neurosurgery - EVD clamped on 2/12 and d/c'd on 2/13 -EEG suggestive of cortical dysfunction in right hemisphere likely 2/2 underlying bleed as well as severe diffuse encephalopathy, nonspecific etiology  -only withdraws to painful stimuli - will need to continue goals of care/palliative, on my discussion with the family they are still hopeful for recovery.  Discussed a little bit more with her mother at bedside today regarding what if the patient will never wake up and she shook her head no and does not consider this an option and is firmly convinced that her daughter will wake up -seizure precautions, aspiration precautions, nutrition, bowel regimen -No significant improvement in her mental status  Active Problems Hypertensive emergency -Home medications include Coreg and HCTZ -currently on metoprolol  -hydral and labetalol prn -SBP goal remains < 160  Neck Contracture  -Pt with head tilted to L, (concern that this may affect trach per PCCM) -Continue baclofen  Fever  Serratia Pneumonia  Hospital Acquired Pneumonia:  -Tracheal aspirate from 2/25 with serratia -There is also  concern for  central fever, started on buspirone -CXR with bilateral lower lobe opacities (L>R concerning for pneumonia). She is status post 2 courses of cefepime for her pneumonia.   Acute hypoxemic respiratory failure secondary to CVA -S/P Trach -continue on trach collar -pulm continuing to follow for trach -Currently on 5 L  Dysphagia -s/p peg on tube feeds, seems to continue to tolerate well  Possible bacterial sinusitis -Off antibiotics now  Sinus tachycardia -Overall improved on metoprolol  Bacterial Conjunctivitis /periorbital cellulitis -left eye had persistent upper eyelid swelling, she was initially placed on erythromycin ointment however the swelling has gotten worse, underwent an MRI of the orbits which showed left periorbital cellulitis without post septal extension.  Status post a course of Ancef  Anasarca -bilateral upper extremity edema, minimal LE edema - suspect related to prolonged hospitalization, hypoalbuminemia -upper extremity US -> age indeterminate SVT of right cephalic vein  Elevated LFT's -mild, continue to monitor overall stable  Leukocytosis -Resolved on antibiotics  Anemia -Stable, follow  Scheduled Meds: . amLODipine  5 mg Oral Daily  . atorvastatin  40 mg Per Tube Daily  . baclofen  5 mg Per Tube BID  . chlorhexidine gluconate (MEDLINE KIT)  15 mL Mouth Rinse BID  . Chlorhexidine Gluconate Cloth  6 each Topical Daily  . enoxaparin (LOVENOX) injection  40 mg Subcutaneous Q24H  . feeding supplement (PROSource TF)  45 mL Per Tube Daily  . free water  100 mL Per Tube Q4H  . hydrALAZINE  50 mg Per Tube Q8H  . insulin aspart  0-9 Units Subcutaneous Q4H  . mouth rinse  15 mL Mouth Rinse 10 times per day  . metoprolol tartrate  100 mg Per Tube BID  . pantoprazole sodium  40 mg Per Tube BID  . scopolamine  1 patch Transdermal Q72H   Continuous Infusions: . sodium chloride Stopped (09/28/20 1344)  . feeding supplement (OSMOLITE 1.2 CAL) 1,000 mL  (09/30/20 1405)   PRN Meds:.sodium chloride, acetaminophen **OR** acetaminophen (TYLENOL) oral liquid 160 mg/5 mL **OR** acetaminophen, hydrALAZINE, labetalol, morphine injection, oxyCODONE, polyethylene glycol, senna-docusate  Diet Orders (From admission, onward)    Start     Ordered   09/10/20 0001  Diet NPO time specified  Diet effective midnight        09/09/20 1421          DVT prophylaxis: enoxaparin (LOVENOX) injection 40 mg Start: 09/01/20 1600 SCD's Start: 08/27/20 0026     Code Status: Full Code  Family Communication: No family at bedside  Status is: Inpatient  Remains inpatient appropriate because:Inpatient level of care appropriate due to severity of illness   Dispo: The patient is from: Home              Anticipated d/c is to: SNF              Patient currently is not medically stable to d/c.   Difficult to place patient No   Level of care: Progressive  Consultants:  Palliative care Pulmonary  Neurology   Procedures:  Lurline Idol, PEG  Microbiology  None   Antimicrobials: Status post 2 courses of cefepime, status post 1 course of cefazolin   Objective: Vitals:   10/01/20 0315 10/01/20 0500 10/01/20 0515 10/01/20 0742  BP: 108/71  (!) 140/99 114/71  Pulse: 77   89  Resp: 16   16  Temp: 98.7 F (37.1 C)   98.2 F (36.8 C)  TempSrc: Axillary   Axillary  SpO2: 100%  100%  Weight:  67.7 kg    Height:        Intake/Output Summary (Last 24 hours) at 10/01/2020 0854 Last data filed at 10/01/2020 0750 Gross per 24 hour  Intake 18840.96 ml  Output 2000 ml  Net 16840.96 ml   Filed Weights   09/29/20 0500 09/30/20 0445 10/01/20 0500  Weight: 66.8 kg 68.2 kg 67.7 kg    Examination:  Constitutional: No distress, obtunded Respiratory: Clear without wheezing Cardiovascular: Regular, no murmurs   Data Reviewed: I have independently reviewed following labs and imaging studies   CBC: Recent Labs  Lab 09/25/20 0331 09/26/20 0217  09/27/20 0315 09/28/20 0151 09/29/20 0223  WBC 6.9 8.4 8.6 6.9 9.0  NEUTROABS 4.4 6.1 6.1 4.5 6.5  HGB 8.1* 9.0* 8.6* 9.3* 8.7*  HCT 26.2* 28.7* 27.1* 29.5* 27.0*  MCV 82.4 81.5 81.9 81.7 81.1  PLT 347 375 352 344 053   Basic Metabolic Panel: Recent Labs  Lab 09/25/20 0331 09/26/20 0217 09/27/20 0315 09/28/20 0151 09/29/20 0223  NA 139 140 141 141 138  K 3.8 3.7 3.9 3.7 3.6  CL 101 102 103 103 102  CO2 _0 GLUCOSE 120* 110* 94 106* 127*  BUN _1 22*  CREATININE 0.73 0.71 0.82 0.70 0.77  CALCIUM 8.8* 9.2 9.1 9.2 9.1  MG 1.9 2.0 2.0 1.9 1.9  PHOS 4.9* 4.3 4.7* 4.9* 4.9*   Liver Function Tests: Recent Labs  Lab 09/25/20 0331 09/26/20 0217 09/27/20 0315 09/28/20 0151 09/29/20 0223  AST 61* 68* 103* 71* 55*  ALT 58* 65* 93* 75* 59*  ALKPHOS 98 109 109 107 100  BILITOT 0.6 0.2* 0.4 0.1* 0.5  PROT 6.5 7.0 6.7 6.8 6.5  ALBUMIN 2.2* 2.3* 2.3* 2.3* 2.3*   Coagulation Profile: Recent Labs  Lab 09/28/20 0151  INR 1.2   HbA1C: No results for input(s): HGBA1C in the last 72 hours. CBG: Recent Labs  Lab 09/30/20 1541 09/30/20 1957 09/30/20 2352 10/01/20 0332 10/01/20 0740  GLUCAP 123* 110* 150* 115* 93    No results found for this or any previous visit (from the past 240 hour(s)).   Radiology Studies: No results found. Marzetta Board, MD, PhD [facility]  Between 7 am - 7 pm I am available, please contact me via Amion or Securechat  Between 7 pm - 7 am I am not available, please contact night coverage MD/APP via Amion

## 2020-10-01 NOTE — Plan of Care (Signed)

## 2020-10-02 ENCOUNTER — Inpatient Hospital Stay (HOSPITAL_COMMUNITY): Payer: Medicaid Other

## 2020-10-02 LAB — CBC
HCT: 28.4 % — ABNORMAL LOW (ref 36.0–46.0)
Hemoglobin: 8.9 g/dL — ABNORMAL LOW (ref 12.0–15.0)
MCH: 25.9 pg — ABNORMAL LOW (ref 26.0–34.0)
MCHC: 31.3 g/dL (ref 30.0–36.0)
MCV: 82.6 fL (ref 80.0–100.0)
Platelets: 345 10*3/uL (ref 150–400)
RBC: 3.44 MIL/uL — ABNORMAL LOW (ref 3.87–5.11)
RDW: 17.2 % — ABNORMAL HIGH (ref 11.5–15.5)
WBC: 10.4 10*3/uL (ref 4.0–10.5)
nRBC: 0 % (ref 0.0–0.2)

## 2020-10-02 LAB — URINALYSIS, ROUTINE W REFLEX MICROSCOPIC
Bilirubin Urine: NEGATIVE
Glucose, UA: NEGATIVE mg/dL
Hgb urine dipstick: NEGATIVE
Ketones, ur: NEGATIVE mg/dL
Leukocytes,Ua: NEGATIVE
Nitrite: NEGATIVE
Protein, ur: NEGATIVE mg/dL
Specific Gravity, Urine: 1.008 (ref 1.005–1.030)
pH: 7 (ref 5.0–8.0)

## 2020-10-02 LAB — COMPREHENSIVE METABOLIC PANEL
ALT: 100 U/L — ABNORMAL HIGH (ref 0–44)
AST: 78 U/L — ABNORMAL HIGH (ref 15–41)
Albumin: 2.4 g/dL — ABNORMAL LOW (ref 3.5–5.0)
Alkaline Phosphatase: 96 U/L (ref 38–126)
Anion gap: 12 (ref 5–15)
BUN: 20 mg/dL (ref 6–20)
CO2: 25 mmol/L (ref 22–32)
Calcium: 8.8 mg/dL — ABNORMAL LOW (ref 8.9–10.3)
Chloride: 100 mmol/L (ref 98–111)
Creatinine, Ser: 0.83 mg/dL (ref 0.44–1.00)
GFR, Estimated: >60 mL/min (ref >60)
Glucose, Bld: 163 mg/dL — ABNORMAL HIGH (ref 70–99)
Potassium: 4 mmol/L (ref 3.5–5.1)
Sodium: 137 mmol/L (ref 135–145)
Total Bilirubin: 0.6 mg/dL (ref 0.3–1.2)
Total Protein: 6.8 g/dL (ref 6.5–8.1)

## 2020-10-02 LAB — GLUCOSE, CAPILLARY
Glucose-Capillary: 102 mg/dL — ABNORMAL HIGH (ref 70–99)
Glucose-Capillary: 117 mg/dL — ABNORMAL HIGH (ref 70–99)
Glucose-Capillary: 121 mg/dL — ABNORMAL HIGH (ref 70–99)
Glucose-Capillary: 142 mg/dL — ABNORMAL HIGH (ref 70–99)
Glucose-Capillary: 142 mg/dL — ABNORMAL HIGH (ref 70–99)
Glucose-Capillary: 93 mg/dL (ref 70–99)

## 2020-10-02 IMAGING — DX DG CHEST 1V PORT
1 series · 1 of 1 positions shown · non-contrast
Comparison: [DATE]

CLINICAL DATA: Fever, shortness of breath

EXAM:
PORTABLE CHEST 1 VIEW

[chest ap]
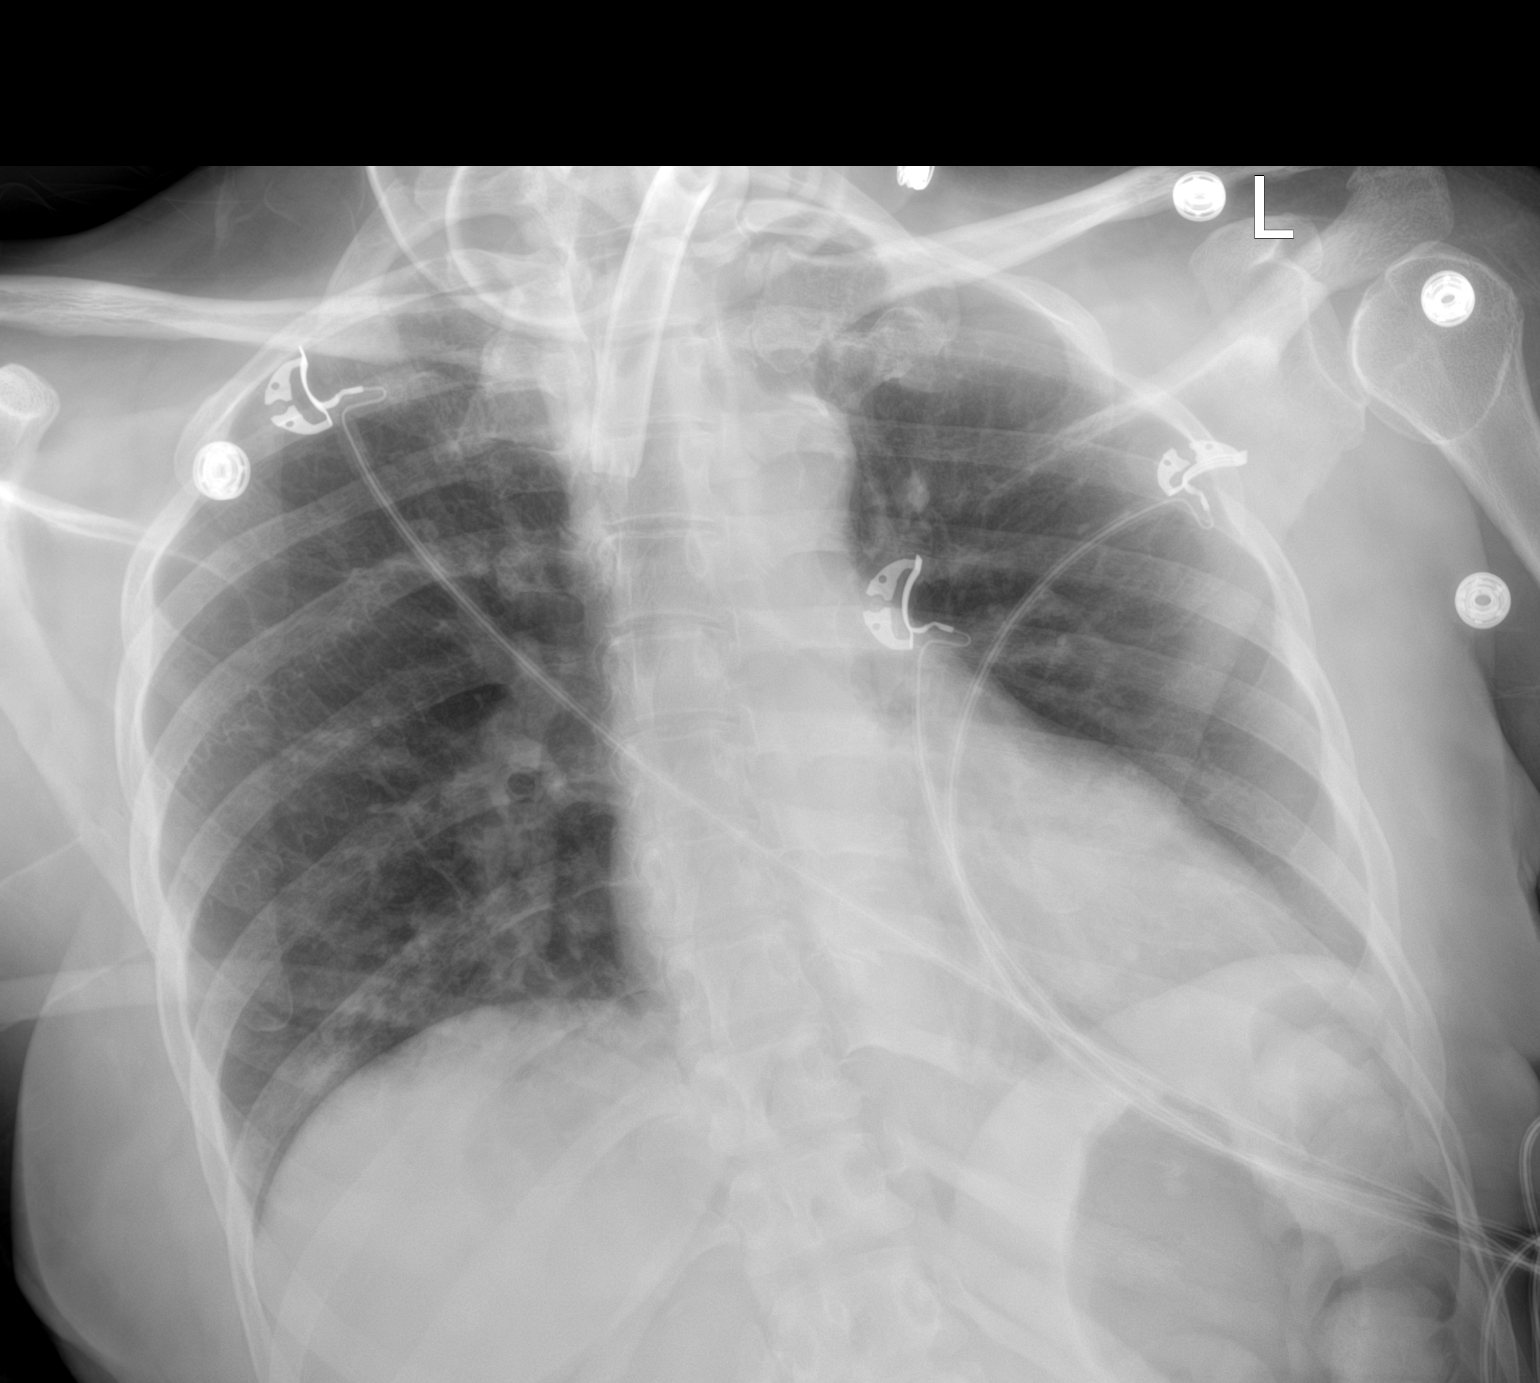

[1 of 1 positions shown; findings below may reference images not displayed]

FINDINGS: The heart size and mediastinal contours are within normal limits.
Tracheostomy. Both lungs are clear. The visualized skeletal
structures are unremarkable.
IMPRESSION: Tracheostomy without acute abnormality of the lungs in AP portable
projection.

## 2020-10-02 NOTE — TOC Initial Note (Signed)
Transition of Care [facility]) - Initial/Assessment Note    Patient Details  Name: Charlene [name] MRN: [id number] Date of Birth: [birth year]  [facility] CM/SW Contact:    Inis Sizer, LCSW Phone Number: 10/02/2020, 9:18 AM  Clinical Narrative:                  CSW spoke with patient's husband [name] to discuss the need to begin searching for placement. CSW and [name] discussed the process for obtaining placement and the barriers. [name] reports he will be unable to provide his wife with 24/7 care at home. [name] reports he will speak with the family before agreeing for CSW to initiate placement.  Expected Discharge Plan: Skilled Nursing Facility Barriers to Discharge: Continued Medical Work up,Inadequate or no insurance   Patient Goals and CMS Choice Patient states their goals for this hospitalization and ongoing recovery are:: Patient's husband reluctant to move forward with finding placement at this time. CMS Medicare.gov Compare Post Acute Care list provided to:: Patient Represenative (must comment) ([name], [name], is at the bedside.) Choice offered to / list presented to : Parent  Expected Discharge Plan and Services Expected Discharge Plan: Skilled Nursing Facility In-house Referral: Clinical Social Work,Hospice / Secondary school teacher Discharge Planning Services: CM Consult   Living arrangements for the past 2 months: Single Family Home                                      Prior Living Arrangements/Services Living arrangements for the past 2 months: Single Family Home Lives with:: Adult Children,Spouse Patient language and need for interpreter reviewed:: Yes Do you feel safe going back to the place where you live?:  (Unable to assess)      Need for Family Participation in Patient Care: Yes (Comment) Care giver support system in place?: Yes (comment)   Criminal Activity/Legal Involvement Pertinent to Current  Situation/Hospitalization: No - Comment as needed  Activities of Daily Living   ADL Screening (condition at time of admission) Patient's cognitive ability adequate to safely complete daily activities?: No Is the patient deaf or have difficulty hearing?: No Does the patient have difficulty seeing, even when wearing glasses/contacts?: No Does the patient have difficulty concentrating, remembering, or making decisions?: Yes Patient able to express need for assistance with ADLs?: No Does the patient have difficulty dressing or bathing?: Yes Independently performs ADLs?: No Communication: Dependent Is this a change from baseline?: Change from baseline, expected to last >3 days Dressing (OT): Dependent Is this a change from baseline?: Change from baseline, expected to last >3 days Grooming: Dependent Is this a change from baseline?: Change from baseline, expected to last >3 days Feeding: Dependent Is this a change from baseline?: Change from baseline, expected to last >3 days Bathing: Dependent Is this a change from baseline?: Change from baseline, expected to last >3 days Toileting: Dependent Is this a change from baseline?: Change from baseline, expected to last >3days In/Out Bed: Dependent Is this a change from baseline?: Change from baseline, expected to last >3 days Walks in Home: Dependent Is this a change from baseline?: Change from baseline, expected to last >3 days Does the patient have difficulty walking or climbing stairs?: Yes Weakness of Legs: Both Weakness of Arms/Hands: Left  Permission Sought/Granted Permission sought to share information with : Case Manager,Facility Contact Representative,Family Supports Permission granted to share information with : Yes, Verbal Permission  Granted     Permission granted to share info w AGENCY: SNF for rehabilitation  Permission granted to share info w Relationship: family supports     Emotional Assessment Appearance:: Appears  stated age Attitude/Demeanor/Rapport: Unable to Assess Affect (typically observed): Unable to Assess Orientation: :  (Unknown) Alcohol / Substance Use: Not Applicable Psych Involvement: No (comment)  Admission diagnosis:  ICH (intracerebral hemorrhage) (HCC) [I61.9] Intraparenchymal hemorrhage of brain [facility]) [I61.9] Patient Active Problem List   Diagnosis Date Noted  . Muscle hypertonicity   . Oropharyngeal dysphagia   . Brain injury with loss of consciousness (HCC)   . Palliative care by specialist   . Status post tracheostomy (HCC)   . Endotracheal tube present   . Fever   . ICH (intracerebral hemorrhage) (HCC) 08/27/2020  . Intraparenchymal hemorrhage of brain (HCC)   . Acute hypoxemic respiratory failure (HCC)   . Anxiety state, unspecified 03/11/2014  . Essential hypertension 03/11/2014  . Essential hypertension, benign 02/28/2014  . Accelerated hypertension 02/21/2014  . Nausea & vomiting 02/21/2014  . Headache 02/21/2014  . Chest pain 02/21/2014   PCP:  Patient, [name] Pharmacy:   [id number] [place], [place] - [address] [place] [address] Phone: [phone] Fax: [phone]     Social Determinants of Health (SDOH) Interventions    Readmission Risk Interventions Readmission Risk Prevention Plan 09/29/2020  Transportation Screening Complete  PCP or Specialist Appt within 3-5 Days Complete  HRI or Home Care Consult Complete  Social Work Consult for Recovery Care Planning/Counseling Complete  Palliative Care Screening Complete  Medication Review Oceanographer) Complete  Some recent data might be hidden

## 2020-10-02 NOTE — Progress Notes (Signed)
PROGRESS NOTE  Charlene [name] MOL:[id number] DOB: Jun 16, 1966 DOA: 08/26/2020 PCP: Patient, [name]   LOS: 36 days   Brief Narrative / Interim history: 55 yo F with 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  Subsequently on 2/10, she developed obtundation and respiratory arrest leading to intubation.  She has remained persistently obtunded, now has a trach and a PEG.  Hospital course complicated by persistent fever, Serratia pneumonia and now pre-orbital cellulitis  2/09 Admit for ICH 2/10 EVD placed, later intubated due to obtundation and respiratory arrest 2/11 MRI Brain, hypertonic saline d/c'd, EVD not working. Husband did not consent to PICC  2/13 EVD removed 2/17 [place] discussion with family, PMT 2/24 Bedside trach and PEG 3/1 Transferred to [place]  Subjective / 24h Interval events: Febrile this morning, tachycardic, remains poorly responsive  Assessment & Plan: Principal Problem Multiple acute strokes  Right Thalamus ICH with IVH  Obstructive Hydrocephalus Acute Metabolic Encephalopathy - CT 2/9 with acute hemorrhage with epicenter in the right thalamus.  Intraventricular penetration with blood filling the 3rd and forth ventricles and nearly filling the right lateral ventricle. MRI 2/11 with intraparenchymal hemorrhage centered within the right thalamus and basal ganglia extending into the right midbrain with intraventricular extension.  Effacement of prepontine cistern and similar 4 mm of leftward midline shift.  Separate areas of ristricted diffusion and edema involving the right greater than left mesial temporal lobes / parahippocampal cortex, bilateral basal ganglia, and possibly the upper pons that are concerning for acute infarcts, most likely 2/2 mass effect from the hemorrhage (see report)/ Per neuro "scattered b/l hippocampus, posterior CC, bilateral thalamus, and upper pons, likely due to  deep penetrating vessel compression by focal mass-effect".  2/19 MRI with acute to early subacute infarcts in the bilateral cerebral white matter and right cerebellum, new from 2/11 - interval evolution of multiple other infarcts present on prior MRI, increased hemorrhage along old right EVD tract (see report) - 2/21 head CT with expected evolution of ICH since 2/13 - echo with normal EF, no intracardiac source of embolism - CTA head/neck without intracranial arterial occlusion or high grade stenosis, no dissection, aneurysm, or hemodynamically significant stenosis of the carotid or vertebral arteries - per neurology - see last follow up note from 2/25 - ICH likely 2/2 hypertension and nonadherence to meds - s/p trach/peg on 2/24 - no antithrombotic 2/2 ICH -for obstructive hydrocephalus -> s/p external ventricular catheter placement on 08/27/20 by neurosurgery - EVD clamped on 2/12 and d/c'd on 2/13 -EEG suggestive of cortical dysfunction in right hemisphere likely 2/2 underlying bleed as well as severe diffuse encephalopathy, nonspecific etiology  -only withdraws to painful stimuli - will need to continue goals of care -seizure precautions, aspiration precautions, nutrition, bowel regimen -No significant improvement in her mental status  Active Problems Recurrent fever 3/18-patient has been having intermittent fevers throughout her hospital stay thought to have a central component as well.  Febrile again this morning, but normotensive.  Hold off on antibiotics, obtain blood cultures, obtain urinalysis, chest x-ray.  There is no reported increased respiratory secretions and overall seems to be uncomfortable.  Hypertensive emergency -Home medications include Coreg and HCTZ -currently on metoprolol  -hydral and labetalol prn -SBP goal remains < 160  Neck Contracture  -Pt with head tilted to L, (concern that this may affect trach per PCCM) -Continue baclofen  Serratia Pneumonia  Hospital  Acquired Pneumonia:  -Tracheal aspirate from 2/25 with serratia -  There is also concern for central fever, started on buspirone -Already completed 2 courses of cefepime for Serratia pneumonia  Acute hypoxemic respiratory failure secondary to CVA -S/P Trach -continue on trach collar -pulm continuing to follow for trach -Currently on 5 L  Dysphagia -s/p peg on tube feeds, seems to continue to tolerate well  Possible bacterial sinusitis -Off antibiotics now  Sinus tachycardia -Overall improved on metoprolol  Bacterial Conjunctivitis /periorbital cellulitis -left eye had persistent upper eyelid swelling, she was initially placed on erythromycin ointment however the swelling has gotten worse, underwent an MRI of the orbits which showed left periorbital cellulitis without post septal extension.  Status post a course of Ancef  Anasarca -bilateral upper extremity edema, minimal LE edema - suspect related to prolonged hospitalization, hypoalbuminemia -upper extremity US -> age indeterminate SVT of right cephalic vein  Elevated LFT's -mild, continue to monitor overall stable  Leukocytosis -Resolved on antibiotics  Anemia -Stable, follow  Scheduled Meds: . amLODipine  5 mg Per Tube Daily  . atorvastatin  40 mg Per Tube Daily  . baclofen  5 mg Per Tube BID  . chlorhexidine gluconate (MEDLINE KIT)  15 mL Mouth Rinse BID  . Chlorhexidine Gluconate Cloth  6 each Topical Daily  . enoxaparin (LOVENOX) injection  40 mg Subcutaneous Q24H  . feeding supplement (PROSource TF)  45 mL Per Tube Daily  . free water  100 mL Per Tube Q4H  . hydrALAZINE  50 mg Per Tube Q8H  . insulin aspart  0-9 Units Subcutaneous Q4H  . mouth rinse  15 mL Mouth Rinse 10 times per day  . metoprolol tartrate  100 mg Per Tube BID  . pantoprazole sodium  40 mg Per Tube BID  . scopolamine  1 patch Transdermal Q72H   Continuous Infusions: . sodium chloride Stopped (09/28/20 1344)  . feeding supplement  (OSMOLITE 1.2 CAL) 55 mL/hr at 10/01/20 1800   PRN Meds:.sodium chloride, acetaminophen **OR** acetaminophen (TYLENOL) oral liquid 160 mg/5 mL **OR** acetaminophen, hydrALAZINE, labetalol, morphine injection, oxyCODONE, polyethylene glycol, senna-docusate  Diet Orders (From admission, onward)    Start     Ordered   09/10/20 0001  Diet NPO time specified  Diet effective midnight        09/09/20 1421          DVT prophylaxis: enoxaparin (LOVENOX) injection 40 mg Start: 09/01/20 1600 SCD's Start: 08/27/20 0026     Code Status: Full Code  Family Communication: No family at bedside, discussed with sister 3/17 at bedside  Status is: Inpatient  Remains inpatient appropriate because:Inpatient level of care appropriate due to severity of illness   Dispo: The patient is from: Home              Anticipated d/c is to: SNF              Patient currently is not medically stable to d/c.   Difficult to place patient No   Level of care: Progressive  Consultants:  Palliative care Pulmonary  Neurology   Procedures:  Lurline Idol, PEG  Microbiology  None   Antimicrobials: Status post 2 courses of cefepime, status post 1 course of cefazolin   Objective: Vitals:   10/02/20 0409 10/02/20 0425 10/02/20 0649 10/02/20 0824  BP: (!) 154/86  (!) 147/89 (!) 146/87  Pulse: (!) 109 (!) 108  (!) 105  Resp: '18 18 13 ' (!) 22  Temp: 98.6 F (37 C)  (!) 102.1 F (38.9 C) 100.3 F (37.9 C)  TempSrc:  Oral  Axillary Axillary  SpO2: 100% 100% 100% 100%  Weight:      Height:        Intake/Output Summary (Last 24 hours) at 10/02/2020 0857 Last data filed at 10/01/2020 1800 Gross per 24 hour  Intake 660 ml  Output 1100 ml  Net -440 ml   Filed Weights   09/29/20 0500 09/30/20 0445 10/01/20 0500  Weight: 66.8 kg 68.2 kg 67.7 kg    Examination:  Constitutional: Obtunded Eyes: lids and conjunctivae normal ENMT: mmm Neck: normal, supple Respiratory: clear to auscultation bilaterally, no  wheezing, no crackles. Normal respiratory effort.  Cardiovascular: Regular rate and rhythm, no murmurs / rubs / gallops.  Trace LE edema.  Tachycardic Abdomen: soft, no distention, no tenderness. Bowel sounds positive.  PEG in place Skin: no rashes Neurologic: no focal deficits, equal strength  Data Reviewed: I have independently reviewed following labs and imaging studies   CBC: Recent Labs  Lab 09/26/20 0217 09/27/20 0315 09/28/20 0151 09/29/20 0223  WBC 8.4 8.6 6.9 9.0  NEUTROABS 6.1 6.1 4.5 6.5  HGB 9.0* 8.6* 9.3* 8.7*  HCT 28.7* 27.1* 29.5* 27.0*  MCV 81.5 81.9 81.7 81.1  PLT 375 352 344 638   Basic Metabolic Panel: Recent Labs  Lab 09/26/20 0217 09/27/20 0315 09/28/20 0151 09/29/20 0223  NA 140 141 141 138  K 3.7 3.9 3.7 3.6  CL 102 103 103 102  CO2 '30 31 29 28  ' GLUCOSE 110* 94 106* 127*  BUN '19 20 20 ' 22*  CREATININE 0.71 0.82 0.70 0.77  CALCIUM 9.2 9.1 9.2 9.1  MG 2.0 2.0 1.9 1.9  PHOS 4.3 4.7* 4.9* 4.9*   Liver Function Tests: Recent Labs  Lab 09/26/20 0217 09/27/20 0315 09/28/20 0151 09/29/20 0223  AST 68* 103* 71* 55*  ALT 65* 93* 75* 59*  ALKPHOS 109 109 107 100  BILITOT 0.2* 0.4 0.1* 0.5  PROT 7.0 6.7 6.8 6.5  ALBUMIN 2.3* 2.3* 2.3* 2.3*   Coagulation Profile: Recent Labs  Lab 09/28/20 0151  INR 1.2   HbA1C: No results for input(s): HGBA1C in the last 72 hours. CBG: Recent Labs  Lab 10/01/20 1613 10/01/20 2007 10/01/20 2339 10/02/20 0408 10/02/20 0823  GLUCAP 98 111* 150* 121* 142*    No results found for this or any previous visit (from the past 240 hour(s)).   Radiology Studies: No results found. Marzetta Board, MD, PhD [facility]  Between 7 am - 7 pm I am available, please contact me via Amion or Securechat  Between 7 pm - 7 am I am not available, please contact night coverage MD/APP via Amion

## 2020-10-03 LAB — GLUCOSE, CAPILLARY
Glucose-Capillary: 118 mg/dL — ABNORMAL HIGH (ref 70–99)
Glucose-Capillary: 116 mg/dL — ABNORMAL HIGH (ref 70–99)
Glucose-Capillary: 163 mg/dL — ABNORMAL HIGH (ref 70–99)
Glucose-Capillary: 99 mg/dL (ref 70–99)
Glucose-Capillary: 111 mg/dL — ABNORMAL HIGH (ref 70–99)

## 2020-10-03 LAB — COMPREHENSIVE METABOLIC PANEL
ALT: 157 U/L — ABNORMAL HIGH (ref 0–44)
AST: 131 U/L — ABNORMAL HIGH (ref 15–41)
Albumin: 2.7 g/dL — ABNORMAL LOW (ref 3.5–5.0)
Alkaline Phosphatase: 117 U/L (ref 38–126)
Anion gap: 7 (ref 5–15)
BUN: 20 mg/dL (ref 6–20)
CO2: 31 mmol/L (ref 22–32)
Calcium: 9.4 mg/dL (ref 8.9–10.3)
Chloride: 103 mmol/L (ref 98–111)
Creatinine, Ser: 0.72 mg/dL (ref 0.44–1.00)
GFR, Estimated: >60 mL/min (ref >60)
Glucose, Bld: 106 mg/dL — ABNORMAL HIGH (ref 70–99)
Potassium: 4.9 mmol/L (ref 3.5–5.1)
Sodium: 141 mmol/L (ref 135–145)
Total Bilirubin: 0.4 mg/dL (ref 0.3–1.2)
Total Protein: 7.6 g/dL (ref 6.5–8.1)

## 2020-10-03 LAB — PHOSPHORUS: Phosphorus: 4.6 mg/dL (ref 2.5–4.6)

## 2020-10-03 LAB — CBC
HCT: 32 % — ABNORMAL LOW (ref 36.0–46.0)
Hemoglobin: 10.1 g/dL — ABNORMAL LOW (ref 12.0–15.0)
MCH: 26 pg (ref 26.0–34.0)
MCHC: 31.6 g/dL (ref 30.0–36.0)
MCV: 82.5 fL (ref 80.0–100.0)
Platelets: 396 10*3/uL (ref 150–400)
RBC: 3.88 MIL/uL (ref 3.87–5.11)
RDW: 17.1 % — ABNORMAL HIGH (ref 11.5–15.5)
WBC: 10.9 10*3/uL — ABNORMAL HIGH (ref 4.0–10.5)
nRBC: 0 % (ref 0.0–0.2)

## 2020-10-03 LAB — MAGNESIUM: Magnesium: 2.1 mg/dL (ref 1.7–2.4)

## 2020-10-03 NOTE — Plan of Care (Signed)
  Problem: Education: Goal: Knowledge of disease or condition will improve Outcome: Progressing Goal: Knowledge of secondary prevention will improve Outcome: Progressing Goal: Knowledge of patient specific risk factors addressed and post discharge goals established will improve Outcome: Progressing Goal: Individualized Educational Video(s) Outcome: Progressing   Problem: Coping: Goal: Will verbalize positive feelings about self Outcome: Progressing Goal: Will identify appropriate support needs Outcome: Progressing   Problem: Health Behavior/Discharge Planning: Goal: Ability to manage health-related needs will improve Outcome: Progressing   Problem: Self-Care: Goal: Ability to participate in self-care as condition permits will improve Outcome: Progressing Goal: Verbalization of feelings and concerns over difficulty with self-care will improve Outcome: Progressing Goal: Ability to communicate needs accurately will improve Outcome: Progressing   Problem: Nutrition: Goal: Risk of aspiration will decrease Outcome: Progressing Goal: Dietary intake will improve Outcome: Progressing   Problem: Intracerebral Hemorrhage Tissue Perfusion: Goal: Complications of Intracerebral Hemorrhage will be minimized Outcome: Progressing   Problem: Education: Goal: Knowledge of General Education information will improve Description: Including pain rating scale, medication(s)/side effects and non-pharmacologic comfort measures Outcome: Progressing   Problem: Health Behavior/Discharge Planning: Goal: Ability to manage health-related needs will improve Outcome: Progressing   Problem: Clinical Measurements: Goal: Ability to maintain clinical measurements within normal limits will improve Outcome: Progressing Goal: Will remain free from infection Outcome: Progressing Goal: Diagnostic test results will improve Outcome: Progressing Goal: Respiratory complications will improve Outcome:  Progressing Goal: Cardiovascular complication will be avoided Outcome: Progressing   Problem: Activity: Goal: Risk for activity intolerance will decrease Outcome: Progressing   Problem: Nutrition: Goal: Adequate nutrition will be maintained Outcome: Progressing   Problem: Coping: Goal: Level of anxiety will decrease Outcome: Progressing   Problem: Elimination: Goal: Will not experience complications related to bowel motility Outcome: Progressing Goal: Will not experience complications related to urinary retention Outcome: Progressing   Problem: Pain Managment: Goal: General experience of comfort will improve Outcome: Progressing   Problem: Safety: Goal: Ability to remain free from injury will improve Outcome: Progressing   Problem: Skin Integrity: Goal: Risk for impaired skin integrity will decrease Outcome: Progressing   

## 2020-10-03 NOTE — Progress Notes (Signed)
PROGRESS NOTE  ELIM PEALE YNW:[id number] DOB: [birth year] DOA: 08/26/2020 PCP: Patient, [name]   LOS: 37 days   Brief Narrative / Interim history: 55 yo F with 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  Subsequently on 2/10, she developed obtundation and respiratory arrest leading to intubation.  She has remained persistently obtunded, now has a trach and a PEG.  Hospital course complicated by persistent fever, Serratia pneumonia and now pre-orbital cellulitis  2/09 Admit for ICH 2/10 EVD placed, later intubated due to obtundation and respiratory arrest 2/11 MRI Brain, hypertonic saline d/c'd, EVD not working. Husband did not consent to PICC  2/13 EVD removed 2/17 [place] discussion with family, PMT 2/24 Bedside trach and PEG 3/1 Transferred to [place]  Subjective / 24h Interval events: Afebrile, unresponsive Assessment & Plan: Principal Problem Multiple acute strokes  Right Thalamus ICH with IVH  Obstructive Hydrocephalus Acute Metabolic Encephalopathy - CT 2/9 with acute hemorrhage with epicenter in the right thalamus.  Intraventricular penetration with blood filling the 3rd and forth ventricles and nearly filling the right lateral ventricle. MRI 2/11 with intraparenchymal hemorrhage centered within the right thalamus and basal ganglia extending into the right midbrain with intraventricular extension.  Effacement of prepontine cistern and similar 4 mm of leftward midline shift.  Separate areas of ristricted diffusion and edema involving the right greater than left mesial temporal lobes / parahippocampal cortex, bilateral basal ganglia, and possibly the upper pons that are concerning for acute infarcts, most likely 2/2 mass effect from the hemorrhage (see report)/ Per neuro "scattered b/l hippocampus, posterior CC, bilateral thalamus, and upper pons, likely due to deep penetrating vessel compression by  focal mass-effect".  2/19 MRI with acute to early subacute infarcts in the bilateral cerebral white matter and right cerebellum, new from 2/11 - interval evolution of multiple other infarcts present on prior MRI, increased hemorrhage along old right EVD tract (see report) - 2/21 head CT with expected evolution of ICH since 2/13 - echo with normal EF, no intracardiac source of embolism - CTA head/neck without intracranial arterial occlusion or high grade stenosis, no dissection, aneurysm, or hemodynamically significant stenosis of the carotid or vertebral arteries - per neurology - see last follow up note from 2/25 - ICH likely 2/2 hypertension and nonadherence to meds - s/p trach/peg on 2/24 - no antithrombotic 2/2 ICH -for obstructive hydrocephalus -> s/p external ventricular catheter placement on 08/27/20 by neurosurgery - EVD clamped on 2/12 and d/c'd on 2/13 -EEG suggestive of cortical dysfunction in right hemisphere likely 2/2 underlying bleed as well as severe diffuse encephalopathy, nonspecific etiology  -only withdraws to painful stimuli - will need to continue goals of care -seizure precautions, aspiration precautions, nutrition, bowel regimen -Mental status without significant improvement  Active Problems Recurrent fever 3/18-patient has been having intermittent fevers throughout her hospital stay thought to have a central component as well.  Fever improved today.  Chest x-ray without pneumonia.  Urinalysis unremarkable.  Blood cultures pending.  Continue to monitor off antibiotics  Hypertensive emergency -Home medications include Coreg and HCTZ -currently on metoprolol  -hydral and labetalol prn -SBP goal remains < 160  Neck Contracture  -Pt with head tilted to L, (concern that this may affect trach per PCCM) -Continue baclofen  Serratia Pneumonia  Hospital Acquired Pneumonia:  -Tracheal aspirate from 2/25 with serratia -There is also concern for central fever, started on  buspirone -Already completed 2 courses of cefepime for Serratia pneumonia  Acute hypoxemic respiratory failure secondary to CVA -S/P Trach -continue on trach collar -pulm continuing to follow for trach -Currently on 5 L  Dysphagia -s/p peg on tube feeds, seems to continue to tolerate well  Possible bacterial sinusitis -Off antibiotics now  Sinus tachycardia -Overall improved on metoprolol  Bacterial Conjunctivitis /periorbital cellulitis -left eye had persistent upper eyelid swelling, she was initially placed on erythromycin ointment however the swelling has gotten worse, underwent an MRI of the orbits which showed left periorbital cellulitis without post septal extension.  Status post a course of Ancef  Anasarca -bilateral upper extremity edema, minimal LE edema - suspect related to prolonged hospitalization, hypoalbuminemia -upper extremity US -> age indeterminate SVT of right cephalic vein  Elevated LFT's -mild, continue to monitor overall stable  Leukocytosis -Resolved on antibiotics  Anemia -Stable, follow  Scheduled Meds: . amLODipine  5 mg Per Tube Daily  . atorvastatin  40 mg Per Tube Daily  . baclofen  5 mg Per Tube BID  . chlorhexidine gluconate (MEDLINE KIT)  15 mL Mouth Rinse BID  . Chlorhexidine Gluconate Cloth  6 each Topical Daily  . enoxaparin (LOVENOX) injection  40 mg Subcutaneous Q24H  . feeding supplement (PROSource TF)  45 mL Per Tube Daily  . free water  100 mL Per Tube Q4H  . hydrALAZINE  50 mg Per Tube Q8H  . insulin aspart  0-9 Units Subcutaneous Q4H  . mouth rinse  15 mL Mouth Rinse 10 times per day  . metoprolol tartrate  100 mg Per Tube BID  . pantoprazole sodium  40 mg Per Tube BID  . scopolamine  1 patch Transdermal Q72H   Continuous Infusions: . sodium chloride Stopped (09/28/20 1344)  . feeding supplement (OSMOLITE 1.2 CAL) 1,000 mL (10/02/20 1141)   PRN Meds:.sodium chloride, acetaminophen **OR** acetaminophen (TYLENOL) oral  liquid 160 mg/5 mL **OR** acetaminophen, hydrALAZINE, labetalol, morphine injection, oxyCODONE, polyethylene glycol, senna-docusate  Diet Orders (From admission, onward)    Start     Ordered   09/10/20 0001  Diet NPO time specified  Diet effective midnight        09/09/20 1421          DVT prophylaxis: enoxaparin (LOVENOX) injection 40 mg Start: 09/01/20 1600 SCD's Start: 08/27/20 0026     Code Status: Full Code  Family Communication: Daughter over the phone  Status is: Inpatient  Remains inpatient appropriate because:Inpatient level of care appropriate due to severity of illness   Dispo: The patient is from: Home              Anticipated d/c is to: SNF              Patient currently is not medically stable to d/c.   Difficult to place patient No   Level of care: Progressive  Consultants:  Palliative care Pulmonary  Neurology   Procedures:  Lurline Idol, PEG  Microbiology  None   Antimicrobials: Status post 2 courses of cefepime, status post 1 course of cefazolin   Objective: Vitals:   10/03/20 0614 10/03/20 0846 10/03/20 0858 10/03/20 0952  BP:    124/78  Pulse: 92 (!) 105  87  Resp: (!) 22 (!) 24 (!) 21   Temp:   98.5 F (36.9 C)   TempSrc:   Axillary   SpO2: 100% 100% 100%   Weight:      Height:        Intake/Output Summary (Last 24 hours) at 10/03/2020 1015 Last data filed at  10/02/2020 1948 Gross per 24 hour  Intake 805 ml  Output 1326 ml  Net -521 ml   Filed Weights   09/29/20 0500 09/30/20 0445 10/01/20 0500  Weight: 66.8 kg 68.2 kg 67.7 kg    Examination:  Constitutional: Obtunded Eyes: No scleral icterus, no swelling ENMT: mmm Neck: normal, supple Respiratory: Clear bilaterally, no wheezing, no crackles Cardiovascular: Regular rate and rhythm, no murmurs, trace edema Abdomen: Soft, NT, ND, bowel sounds positive.  PEG in place Skin: No rashes Neurologic: Obtunded  Data Reviewed: I have independently reviewed following labs and  imaging studies   CBC: Recent Labs  Lab 09/27/20 0315 09/28/20 0151 09/29/20 0223 10/02/20 0733 10/03/20 0147  WBC 8.6 6.9 9.0 10.4 10.9*  NEUTROABS 6.1 4.5 6.5  --   --   HGB 8.6* 9.3* 8.7* 8.9* 10.1*  HCT 27.1* 29.5* 27.0* 28.4* 32.0*  MCV 81.9 81.7 81.1 82.6 82.5  PLT 352 344 339 345 179   Basic Metabolic Panel: Recent Labs  Lab 09/27/20 0315 09/28/20 0151 09/29/20 0223 10/02/20 0733 10/03/20 0147  NA 141 141 138 137 141  K 3.9 3.7 3.6 4.0 4.9  CL 103 103 102 100 103  CO2 _0 GLUCOSE 94 106* 127* 163* 106*  BUN 20 20 22* 20 20  CREATININE 0.82 0.70 0.77 0.83 0.72  CALCIUM 9.1 9.2 9.1 8.8* 9.4  MG 2.0 1.9 1.9  --  2.1  PHOS 4.7* 4.9* 4.9*  --  4.6   Liver Function Tests: Recent Labs  Lab 09/27/20 0315 09/28/20 0151 09/29/20 0223 10/02/20 0733 10/03/20 0147  AST 103* 71* 55* 78* 131*  ALT 93* 75* 59* 100* 157*  ALKPHOS 109 107 100 96 117  BILITOT 0.4 0.1* 0.5 0.6 0.4  PROT 6.7 6.8 6.5 6.8 7.6  ALBUMIN 2.3* 2.3* 2.3* 2.4* 2.7*   Coagulation Profile: Recent Labs  Lab 09/28/20 0151  INR 1.2   HbA1C: No results for input(s): HGBA1C in the last 72 hours. CBG: Recent Labs  Lab 10/02/20 1641 10/02/20 2034 10/02/20 2345 10/03/20 0402 10/03/20 0805  GLUCAP 93 102* 142* 118* 116*    No results found for this or any previous visit (from the past 240 hour(s)).   Radiology Studies: No results found. Marzetta Board, MD, PhD [facility]  Between 7 am - 7 pm I am available, please contact me via Amion or Securechat  Between 7 pm - 7 am I am not available, please contact night coverage MD/APP via Amion

## 2020-10-04 LAB — GLUCOSE, CAPILLARY
Glucose-Capillary: 101 mg/dL — ABNORMAL HIGH (ref 70–99)
Glucose-Capillary: 117 mg/dL — ABNORMAL HIGH (ref 70–99)
Glucose-Capillary: 124 mg/dL — ABNORMAL HIGH (ref 70–99)
Glucose-Capillary: 136 mg/dL — ABNORMAL HIGH (ref 70–99)
Glucose-Capillary: 151 mg/dL — ABNORMAL HIGH (ref 70–99)
Glucose-Capillary: 157 mg/dL — ABNORMAL HIGH (ref 70–99)
Glucose-Capillary: 178 mg/dL — ABNORMAL HIGH (ref 70–99)

## 2020-10-04 MED ORDER — METOPROLOL TARTRATE 12.5 MG HALF TABLET: 12.5000 mg | ORAL_TABLET | Freq: Two times a day (BID) | ORAL | Status: DC

## 2020-10-04 MED ORDER — SODIUM CHLORIDE 0.9 % IV BOLUS: 500.0000 mL | Freq: Once | INTRAVENOUS | Status: DC

## 2020-10-04 NOTE — Progress Notes (Signed)
PROGRESS NOTE  Charlene [name]:[id number] DOB: 06-Jul-1966 DOA: 08/26/2020 PCP: Patient, [name]   LOS: 38 days   Brief Narrative / Interim history: 55 yo F with 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  Subsequently on 2/10, she developed obtundation and respiratory arrest leading to intubation.  She has remained persistently obtunded, now has a trach and a PEG.  Hospital course complicated by persistent fever, Serratia pneumonia and now pre-orbital cellulitis  2/09 Admit for ICH 2/10 EVD placed, later intubated due to obtundation and respiratory arrest 2/11 MRI Brain, hypertonic saline d/c'd, EVD not working. Husband did not consent to PICC  2/13 EVD removed 2/17 [place] discussion with family, PMT 2/24 Bedside trach and PEG 3/1 Transferred to [place]  Subjective / 24h Interval events: Unresponsive  Assessment & Plan: Principal Problem Multiple acute strokes  Right Thalamus ICH with IVH  Obstructive Hydrocephalus Acute Metabolic Encephalopathy - CT 2/9 with acute hemorrhage with epicenter in the right thalamus.  Intraventricular penetration with blood filling the 3rd and forth ventricles and nearly filling the right lateral ventricle. MRI 2/11 with intraparenchymal hemorrhage centered within the right thalamus and basal ganglia extending into the right midbrain with intraventricular extension.  Effacement of prepontine cistern and similar 4 mm of leftward midline shift.  Separate areas of ristricted diffusion and edema involving the right greater than left mesial temporal lobes / parahippocampal cortex, bilateral basal ganglia, and possibly the upper pons that are concerning for acute infarcts, most likely 2/2 mass effect from the hemorrhage (see report)/ Per neuro "scattered b/l hippocampus, posterior CC, bilateral thalamus, and upper pons, likely due to deep penetrating vessel compression by focal  mass-effect".  2/19 MRI with acute to early subacute infarcts in the bilateral cerebral white matter and right cerebellum, new from 2/11 - interval evolution of multiple other infarcts present on prior MRI, increased hemorrhage along old right EVD tract (see report) - 2/21 head CT with expected evolution of ICH since 2/13 - echo with normal EF, no intracardiac source of embolism - CTA head/neck without intracranial arterial occlusion or high grade stenosis, no dissection, aneurysm, or hemodynamically significant stenosis of the carotid or vertebral arteries - per neurology - see last follow up note from 2/25 - ICH likely 2/2 hypertension and nonadherence to meds - s/p trach/peg on 2/24 - no antithrombotic 2/2 ICH -for obstructive hydrocephalus -> s/p external ventricular catheter placement on 08/27/20 by neurosurgery - EVD clamped on 2/12 and d/c'd on 2/13 -EEG suggestive of cortical dysfunction in right hemisphere likely 2/2 underlying bleed as well as severe diffuse encephalopathy, nonspecific etiology  -only withdraws to painful stimuli - will need to continue goals of care -seizure precautions, aspiration precautions, nutrition, bowel regimen -Remains obtunded today  Active Problems Recurrent fever 3/18-patient has been having intermittent fevers throughout her hospital stay thought to have a central component as well.  Fever improved today.  Chest x-ray without pneumonia.  Urinalysis unremarkable.  Blood cultures pending.  Continue to monitor off antibiotics  Hypertensive emergency -Home medications include Coreg and HCTZ -currently on metoprolol  -hydral and labetalol prn -SBP goal remains < 160  Neck Contracture  -Pt with head tilted to L, (concern that this may affect trach per PCCM) -Continue baclofen  Serratia Pneumonia  Hospital Acquired Pneumonia:  -Tracheal aspirate from 2/25 with serratia -There is also concern for central fever, started on buspirone -Already completed 2  courses of cefepime for Serratia pneumonia  Acute  hypoxemic respiratory failure secondary to CVA -S/P Trach -continue on trach collar -pulm continuing to follow for trach -Currently on 5 L  Dysphagia -s/p peg on tube feeds, seems to continue to tolerate well  Possible bacterial sinusitis -Off antibiotics now  Sinus tachycardia -Overall improved on metoprolol  Bacterial Conjunctivitis /periorbital cellulitis -left eye had persistent upper eyelid swelling, she was initially placed on erythromycin ointment however the swelling has gotten worse, underwent an MRI of the orbits which showed left periorbital cellulitis without post septal extension.  Status post a course of Ancef  Anasarca -bilateral upper extremity edema, minimal LE edema - suspect related to prolonged hospitalization, hypoalbuminemia -upper extremity US -> age indeterminate SVT of right cephalic vein  Elevated LFT's -mild, continue to monitor overall stable  Leukocytosis -Resolved on antibiotics  Anemia -Stable, follow  Scheduled Meds: . amLODipine  5 mg Per Tube Daily  . atorvastatin  40 mg Per Tube Daily  . baclofen  5 mg Per Tube BID  . chlorhexidine gluconate (MEDLINE KIT)  15 mL Mouth Rinse BID  . Chlorhexidine Gluconate Cloth  6 each Topical Daily  . enoxaparin (LOVENOX) injection  40 mg Subcutaneous Q24H  . feeding supplement (PROSource TF)  45 mL Per Tube Daily  . free water  100 mL Per Tube Q4H  . hydrALAZINE  50 mg Per Tube Q8H  . insulin aspart  0-9 Units Subcutaneous Q4H  . mouth rinse  15 mL Mouth Rinse 10 times per day  . metoprolol tartrate  100 mg Per Tube BID  . pantoprazole sodium  40 mg Per Tube BID  . scopolamine  1 patch Transdermal Q72H   Continuous Infusions: . sodium chloride Stopped (09/28/20 1344)  . feeding supplement (OSMOLITE 1.2 CAL) 1,000 mL (10/04/20 0538)   PRN Meds:.sodium chloride, acetaminophen **OR** acetaminophen (TYLENOL) oral liquid 160 mg/5 mL **OR**  acetaminophen, hydrALAZINE, labetalol, morphine injection, oxyCODONE, polyethylene glycol, senna-docusate  Diet Orders (From admission, onward)    Start     Ordered   09/10/20 0001  Diet NPO time specified  Diet effective midnight        09/09/20 1421          DVT prophylaxis: enoxaparin (LOVENOX) injection 40 mg Start: 09/01/20 1600 SCD's Start: 08/27/20 0026    Code Status: Full Code  Family Communication: Daughter over the phone  Status is: Inpatient  Remains inpatient appropriate because:Inpatient level of care appropriate due to severity of illness  Dispo: The patient is from: Home              Anticipated d/c is to: SNF              Patient currently is not medically stable to d/c.   Difficult to place patient No   Level of care: Progressive  Consultants:  Palliative care Pulmonary  Neurology   Procedures:  Lurline Idol, PEG  Microbiology  None   Antimicrobials: Status post 2 courses of cefepime, status post 1 course of cefazolin   Objective: Vitals:   10/04/20 0418 10/04/20 0658 10/04/20 0700 10/04/20 0820  BP: (!) 127/95     Pulse: 89   90  Resp: 18   18  Temp: 99.6 F (37.6 C) 100.3 F (37.9 C) 99.3 F (37.4 C)   TempSrc: Oral  Axillary   SpO2: 100%   97%  Weight:      Height:        Intake/Output Summary (Last 24 hours) at 10/04/2020 9735 Last data filed at  10/04/2020 0731 Gross per 24 hour  Intake --  Output 1100 ml  Net -1100 ml   Filed Weights   09/29/20 0500 09/30/20 0445 10/01/20 0500  Weight: 66.8 kg 68.2 kg 67.7 kg    Examination:  Constitutional: Obtunded Eyes: no icterus  ENMT: mmm Neck: normal, supple Respiratory: CTA biL, no wheezing Cardiovascular: RRR, no murmurs, no edema Abdomen: Nontender, nondistended, bowel sounds positive PEG in place Skin: No rashes Neurologic: Not following commands  Data Reviewed: I have independently reviewed following labs and imaging studies   CBC: Recent Labs  Lab 09/28/20 0151  09/29/20 0223 10/02/20 0733 10/03/20 0147  WBC 6.9 9.0 10.4 10.9*  NEUTROABS 4.5 6.5  --   --   HGB 9.3* 8.7* 8.9* 10.1*  HCT 29.5* 27.0* 28.4* 32.0*  MCV 81.7 81.1 82.6 82.5  PLT 344 339 345 592   Basic Metabolic Panel: Recent Labs  Lab 09/28/20 0151 09/29/20 0223 10/02/20 0733 10/03/20 0147  NA 141 138 137 141  K 3.7 3.6 4.0 4.9  CL 103 102 100 103  CO2 _0 GLUCOSE 106* 127* 163* 106*  BUN 20 22* 20 20  CREATININE 0.70 0.77 0.83 0.72  CALCIUM 9.2 9.1 8.8* 9.4  MG 1.9 1.9  --  2.1  PHOS 4.9* 4.9*  --  4.6   Liver Function Tests: Recent Labs  Lab 09/28/20 0151 09/29/20 0223 10/02/20 0733 10/03/20 0147  AST 71* 55* 78* 131*  ALT 75* 59* 100* 157*  ALKPHOS 107 100 96 117  BILITOT 0.1* 0.5 0.6 0.4  PROT 6.8 6.5 6.8 7.6  ALBUMIN 2.3* 2.3* 2.4* 2.7*   Coagulation Profile: Recent Labs  Lab 09/28/20 0151  INR 1.2   HbA1C: No results for input(s): HGBA1C in the last 72 hours. CBG: Recent Labs  Lab 10/03/20 1518 10/03/20 2026 10/04/20 0012 10/04/20 0419 10/04/20 0722  GLUCAP 99 111* 178* 136* 124*    Recent Results (from the past 240 hour(s))  Culture, blood (Routine X 2) w Reflex to ID Panel     Status: None (Preliminary result)   Collection Time: 10/02/20  7:38 AM   Specimen: BLOOD  Result Value Ref Range Status   Specimen Description BLOOD RIGHT ANTECUBITAL  Final   Special Requests   Final    BOTTLES DRAWN AEROBIC AND ANAEROBIC Blood Culture results may not be optimal due to an inadequate volume of blood received in culture bottles   Culture   Final    NO GROWTH 1 DAY Performed at [facility], [place] [address], [place], [place] [address]    Report Status PENDING  Incomplete  Culture, blood (Routine X 2) w Reflex to ID Panel     Status: None (Preliminary result)   Collection Time: 10/02/20  7:42 AM   Specimen: BLOOD RIGHT HAND  Result Value Ref Range Status   Specimen Description BLOOD RIGHT HAND  Final   Special Requests   Final     BOTTLES DRAWN AEROBIC ONLY Blood Culture adequate volume   Culture   Final    NO GROWTH 1 DAY Performed at [facility], [place] [address], [place],  [address]    Report Status PENDING  Incomplete     Radiology Studies: No results found. Marzetta Board, MD, PhD [facility]  Between 7 am - 7 pm I am available, please contact me via Amion or Securechat  Between 7 pm - 7 am I am not available, please contact night  coverage MD/APP via Amion

## 2020-10-04 NOTE — Progress Notes (Addendum)
   10/04/20 1444  Assess: MEWS Score  BP (!) 171/100  Pulse Rate 98  ECG Heart Rate 98  Resp 18  Level of Consciousness Unresponsive  SpO2 100 %  Assess: MEWS Score  MEWS Temp 0  MEWS Systolic 0  MEWS Pulse 0  MEWS RR 0  MEWS LOC 3  MEWS Score 3  MEWS Score Color Yellow  Assess: if the MEWS score is Yellow or Red  Were vital signs taken at a resting state? Yes  Focused Assessment No change from prior assessment  Early Detection of Sepsis Score *See Row Information* Low  MEWS guidelines implemented *See Row Information* No, altered LOC is baseline  Notify: Charge Nurse/RN  Name of Charge Nurse/RN Notified Angela, RN  Date Charge Nurse/RN Notified 10/04/20  Time Charge Nurse/RN Notified 1440  Notify: Provider  Provider Name/Title Dr. Lafe Garin  Date Provider Notified 10/04/20  Time Provider Notified 1430  Notification Type Face-to-face  Notification Reason Other (Comment)  Provider response In department  Date of Provider Response 10/04/20  Time of Provider Response 1430   Patient given Labetalol as ordered.  Will monitor vital signs hourly.

## 2020-10-04 NOTE — Progress Notes (Signed)
   10/04/20 0700  Assess: MEWS Score  Temp 99.3 F (37.4 C)  BP (!) 151/101  Pulse Rate 97  ECG Heart Rate (!) 101  Resp 19  Level of Consciousness Unresponsive  SpO2 100 %  O2 Device Tracheostomy Collar  Assess: MEWS Score  MEWS Temp 0  MEWS Systolic 0  MEWS Pulse 1  MEWS RR 0  MEWS LOC 3  MEWS Score 4  MEWS Score Color Red  Assess: if the MEWS score is Yellow or Red  Were vital signs taken at a resting state? Yes  Focused Assessment No change from prior assessment  Early Detection of Sepsis Score *See Row Information* Low  MEWS guidelines implemented *See Row Information* No, altered LOC is baseline  Treat  Pain Scale Faces  Pain Score 0   MD is aware of patient's status.  NNO at this time.  LOC has not changed over several weeks.

## 2020-10-05 ENCOUNTER — Inpatient Hospital Stay (HOSPITAL_COMMUNITY): Payer: Medicaid Other

## 2020-10-05 DIAGNOSIS — S069X9D Unspecified intracranial injury with loss of consciousness of unspecified duration, subsequent encounter: Secondary | ICD-10-CM

## 2020-10-05 LAB — CBC
HCT: 29.4 % — ABNORMAL LOW (ref 36.0–46.0)
Hemoglobin: 9 g/dL — ABNORMAL LOW (ref 12.0–15.0)
MCH: 25.2 pg — ABNORMAL LOW (ref 26.0–34.0)
MCHC: 30.6 g/dL (ref 30.0–36.0)
MCV: 82.4 fL (ref 80.0–100.0)
Platelets: 377 10*3/uL (ref 150–400)
RBC: 3.57 MIL/uL — ABNORMAL LOW (ref 3.87–5.11)
RDW: 17 % — ABNORMAL HIGH (ref 11.5–15.5)
WBC: 7.9 10*3/uL (ref 4.0–10.5)
nRBC: 0 % (ref 0.0–0.2)

## 2020-10-05 LAB — GLUCOSE, CAPILLARY
Glucose-Capillary: 115 mg/dL — ABNORMAL HIGH (ref 70–99)
Glucose-Capillary: 94 mg/dL (ref 70–99)
Glucose-Capillary: 158 mg/dL — ABNORMAL HIGH (ref 70–99)
Glucose-Capillary: 106 mg/dL — ABNORMAL HIGH (ref 70–99)
Glucose-Capillary: 112 mg/dL — ABNORMAL HIGH (ref 70–99)
Glucose-Capillary: 147 mg/dL — ABNORMAL HIGH (ref 70–99)

## 2020-10-05 IMAGING — DX DG CHEST 1V PORT
1 series · 1 of 1 positions shown · non-contrast
Comparison: [DATE]

CLINICAL DATA: Increased tracheal secretions.

EXAM:
PORTABLE CHEST 1 VIEW

[chest]
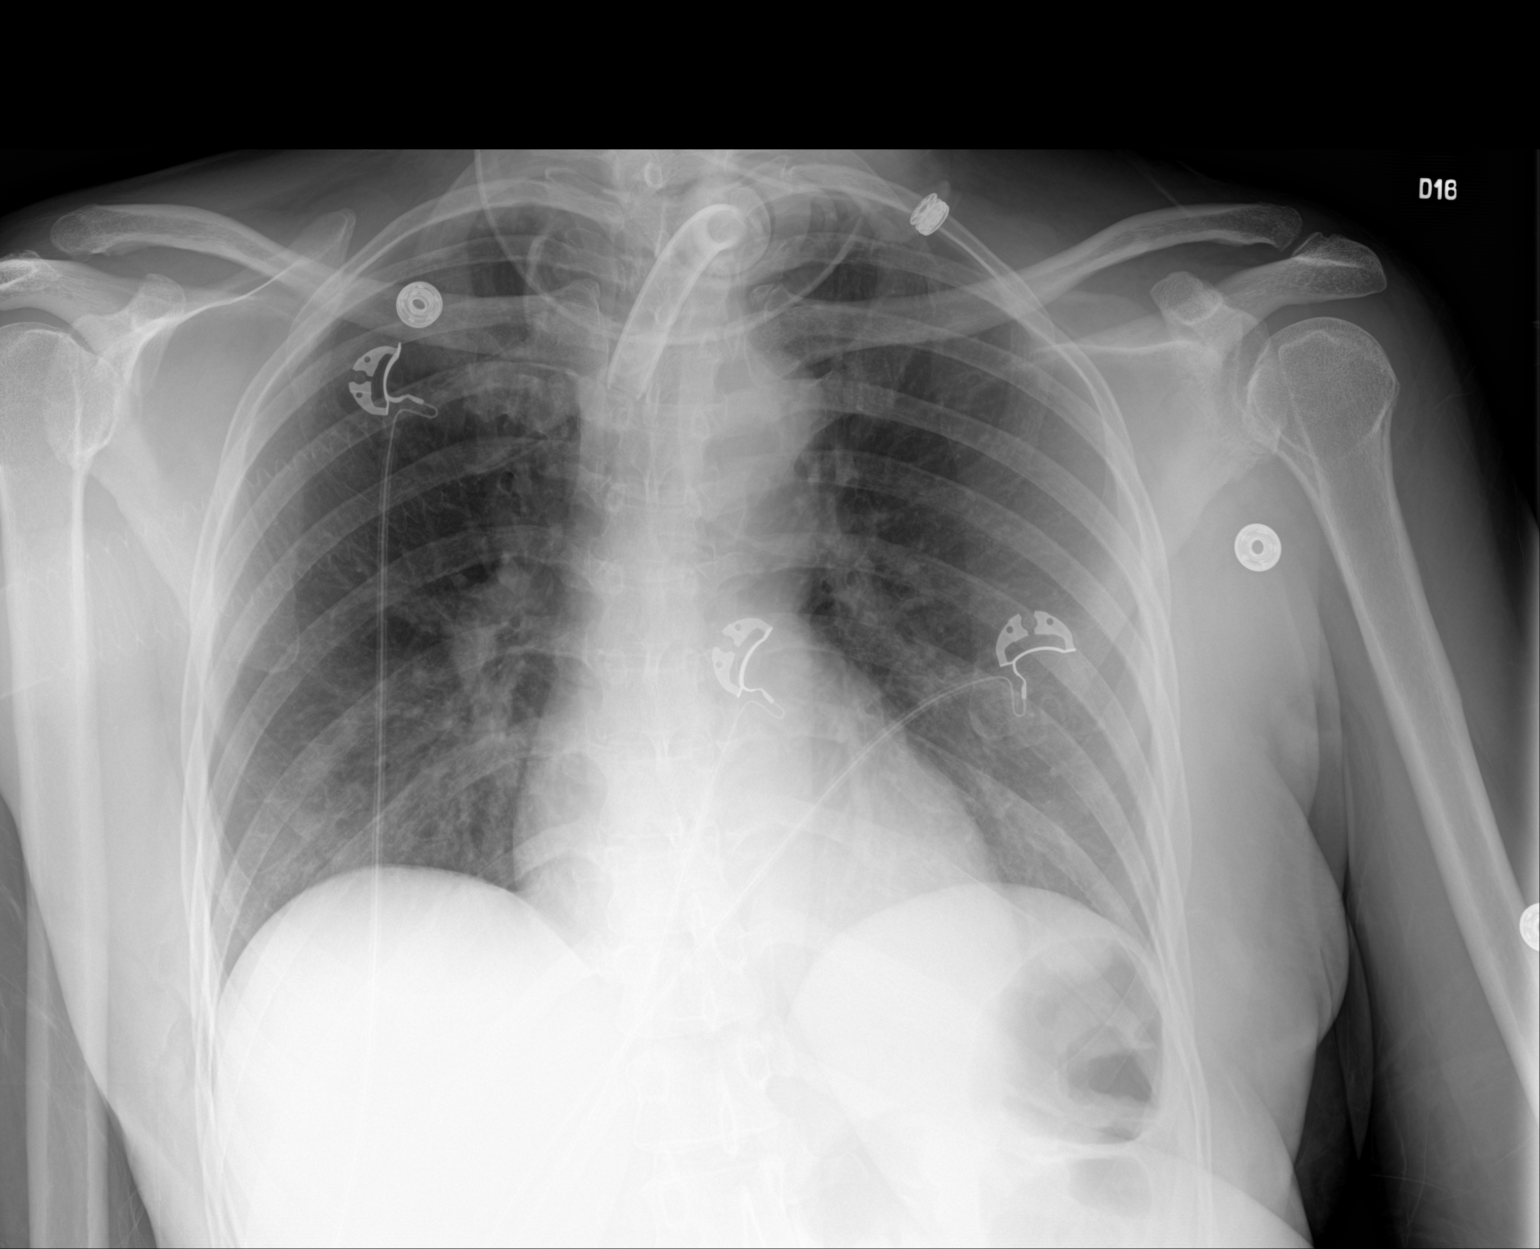

[1 of 1 positions shown; findings below may reference images not displayed]

FINDINGS: [JB] hours. Tracheostomy tube again noted. The lungs are clear
without focal pneumonia, edema, pneumothorax or pleural effusion.
The cardiopericardial silhouette is within normal limits for size.
The visualized bony structures of the thorax show no acute
abnormality. Telemetry leads overlie the chest.
IMPRESSION: No acute cardiopulmonary findings.

## 2020-10-05 NOTE — Progress Notes (Signed)
Occupational Therapy Treatment Patient Details Name: Charlene [name] MRN: [id number] DOB: [birth year] Today's Date: 10/05/2020    History of present illness Pt is 55 yo female presents to [facility] on 2/9 with L sided weakness and headaches. PMH includes anxiety, HTN. 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 s/p Right frontal ventricular catheter placement on 2/10, stopped functioning on 2/11.  MRI 2/11 shows right greater than left mesial temporal lobes / parahippocampal cortex, bilateral basal ganglia, and possibly the upper pons that are concerning for acute infarcts, most likely secondary to mass effect from the hemorrhage. ETT 2/10-present.  Pt with trach and PEG on 09/10/20. Hospital course complicated by persistent fever, Serratia pneumonia and now pre-orbital cellulitis.   OT comments  Patients mother present, provided further education on ROM, stretch into extension, elevation of UEs and resting hand splint to L UE.  Mother demonstrates ability to don L resting hand splint with supervision (min cueing for straps and tightness), but good recall of schedule; PROM to BUEs with supervision, given cueing for supination, elbow extension and shoulder flexion. Pt positioned with splint on L UE, UEs in elevation upon exit.  Reviewed splint schedule with RN. Discussed OT plan with mother- will decrease to 1x/week and review education again next week, if family is comfortable OT will sign off at that point. OT to continue to follow acutely.    Follow Up Recommendations  SNF;Supervision/Assistance - 24 hour    Equipment Recommendations  Other (comment) (TBD at next venue of care)    Recommendations for Other Services      Precautions / Restrictions Precautions Precautions: Fall Precaution Comments: watch BP and HR, peg and trach Restrictions Weight Bearing  Restrictions: No       Mobility Bed Mobility                    Transfers                 General transfer comment: unable    Balance                                           ADL either performed or assessed with clinical judgement   ADL                                         General ADL Comments: total A     Vision   Additional Comments: eyes partially open during session, no response to therapist or blinking   Perception     Praxis      Cognition Arousal/Alertness: Awake/alert Behavior During Therapy: Flat affect Overall Cognitive Status: Difficult to assess                                 General Comments: remains unresponsive, withdraws to UE ROM/touching R>L UE; both eyes partially open but does not blink or respond to therapist        Exercises     Shoulder Instructions       General Comments Mother present and supportive, reviewed exericses to BUEs with mother demonstrating understanding, educated  on tone and importance of L sided extension; elevation to BUEs on pillows at all times and resting hand splint use 4-6 hours a day with mother demonstrating ability to don brace with supervision.    Pertinent Vitals/ Pain       Pain Assessment: Faces Faces Pain Scale: Hurts a little bit Pain Location: withdrawls to discomfort R UE> L UE Pain Descriptors / Indicators: Discomfort Pain Intervention(s): Monitored during session  Home Living                                          Prior Functioning/Environment              Frequency  Min 1X/week        Progress Toward Goals  OT Goals(current goals can now be found in the care plan section)  Progress towards OT goals: Progressing toward goals  Acute Rehab OT Goals Patient Stated Goal: pt unable, non responsive  Plan Discharge plan remains appropriate;Frequency needs to be updated    Co-evaluation                  AM-PAC OT "6 Clicks" Daily Activity     Outcome Measure   Help from another person eating meals?: Total Help from another person taking care of personal grooming?: Total Help from another person toileting, which includes using toliet, bedpan, or urinal?: Total Help from another person bathing (including washing, rinsing, drying)?: Total Help from another person to put on and taking off regular upper body clothing?: Total Help from another person to put on and taking off regular lower body clothing?: Total 6 Click Score: 6    End of Session    OT Visit Diagnosis: Other symptoms and signs involving the nervous system (R29.898);Other symptoms and signs involving cognitive function;Other (comment) Symptoms and signs involving cognitive functions: Other Nontraumatic ICH Hemiplegia - Right/Left:  (both) Hemiplegia - caused by: Other Nontraumatic intracranial hemorrhage   Activity Tolerance Patient tolerated treatment well   Patient Left in bed;with call bell/phone within reach;with family/visitor present;with bed alarm set   Nurse Communication Mobility status;Other (comment) (resting hand splint schedule)        Time: 6803-2122 OT Time Calculation (min): 18 min  Charges: OT General Charges $OT Visit: 1 Visit OT Treatments $Therapeutic Activity: 8-22 mins  Barry Brunner, OT Acute Rehabilitation Services Pager [phone] Office [phone]    Charlene [name] 10/05/2020, 11:43 AM

## 2020-10-05 NOTE — Progress Notes (Signed)
[facility] PROGRESS NOTE  JAYANA KOTULA LDJ:[id number] DOB: [birth year] DOA: 08/26/2020 PCP: Patient, [name]  Status: Remains inpatient appropriate because:Altered mental status, Unsafe d/c plan, IV treatments appropriate due to intensity of illness or inability to take PO and Inpatient level of care appropriate due to severity of illness   Dispo: The patient is from: Home              Anticipated d/c is to: SNF-family wants to take pt home              Patient currently is not medically stable to d/c.   Difficult to place patient Yes   Level of care: Progressive  Code Status: Full Family Communication: Mother at bedside on 3/21; extensive conversation via telephone with patient's [name] [phone] has been updated on need to provide POA documentation which will be brought to the hospital by Wednesday.  Also updated on patient's clinical status supporting family desire to wish for patient to demonstrate improvement although the improvement patient has demonstrated has been very slow and she may be at her maximum level of function.  Because of this I cautioned the daughter that she may not be a candidate for aggressive rehab including at a brain injury facility.  She reports that family does not wish for patient to go to an SNF.  Emphasized to daughter that it is important now to begin planning for possibly taking her mother home and discussing with the family if they would be able to provide 24/7 care noting that home health follow-up with nursing, PT and OT is very short-term in nature and a majority of the care would fall to the patient's family.  She states that this week and hopefully by Wednesday she will of had a discussion with all pertinent family members regarding the management of the patient at home and likelihood that patient will definitely need to discharge to home given family desire to not place in SNF and again patient does not appear to be a candidate  for aggressive brain injury rehab. 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 Subsequently on 2/10, she developed obtundation and respiratory arrest leading to intubation. She has remained persistently obtunded, now has a trach and a PEG. Hospital course complicated by persistent fever,Serratia pneumoniaand now pre-orbital cellulitis  2/09 Admit for ICH 2/10 EVD placed, later intubated due to obtundation and respiratory arrest 2/11 MRI Brain, hypertonic saline d/c'd, EVD not working. Husband did not consent to PICC  2/13 EVD removed 2/17 [facility] discussion with family, PMT 2/24 Bedside trach and PEG 3/1 Transferred to [facility]  Subjective: Patient sleeping and not responsive.  Discussed with RN who stated that patient had been alert earlier today.  Was tracking with left eye.  Was attempting to move and follow some simple commands.  Objective: Vitals:   10/05/20 0643 10/05/20 0714  BP: (!) (P) 155/85 129/77  Pulse:  87  Resp: (P) 17 19  Temp: 98.8 F (37.1 C) 98.9 F (37.2 C)  SpO2: (P) 100% 100%    Intake/Output Summary (Last 24 hours) at 10/05/2020 0745 Last data filed at 10/04/2020 2140 Gross per 24 hour  Intake 385 ml  Output 300 ml  Net 85 ml   Filed Weights   09/29/20 0500 09/30/20 0445 10/01/20 0500  Weight: 66.8 kg 68.2 kg 67.7 kg  Exam:  Constitutional: Calm, no acute distress ENT: No spontaneous opening of left today. Respiratory: #6.0 cuffless trach, trach collar with FiO2 21%.  Bilateral lung sounds are clear Cardiovascular: Normal heart sounds, no JVD, improved peripheral edema, some episodes of tachycardia and hypertension likely related to pain. Abdomen:  PEG with tube feedings.  Soft and nontender.  LBM 3/20 Musculoskeletal: Patient has issues with diffuse hypertonicity  without any associated spasticity.  Has torticollis involving the neck with persistent positioning of head towards the left although seems to be improving with recent increase in baclofen last week. Hypertonicity of all 4 extremities noted w/ PROM Neurologic: CN 2-12 intact although may have some mild upper lid ptosis on the right.   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 Intraventricular penetration with blood filling the 3rd and forth ventricles and nearly filling the right lateral ventricle.  -MRI 2/11 with intraparenchymal hemorrhage centered within the right thalamus and basal ganglia extending into the right midbrain with intraventricular extension. Effacement of prepontine cistern and similar 4 mm of leftward midline shift. Separate areas of ristricted diffusion and edema involving the right greater than left mesial temporal lobes / parahippocampal cortex, bilateral basal ganglia, and possibly the upper pons that are concerning for acute infarcts, most likely 2/2 mass effect from the hemorrhage (see report)/ Per neuro "scattered b/l hippocampus, posterior CC, bilateral thalamus, and upper pons, likely due to deep penetrating vessel compression by focal mass-effect".  -2/19 MRI with acute to early subacute infarcts in the bilateral cerebral white matter and right cerebellum, new from 2/11 - interval evolution of multiple other infarcts present on prior MRI, increased hemorrhage along old right EVD tract (see report) - 2/21 head CT with expected evolution of ICH since 2/13 - Echo with normal EF, no intracardiac source of embolism - CTA head/neck without intracranial arterial occlusion or high grade stenosis, no dissection, aneurysm, or hemodynamically  significant stenosis of the carotid or vertebral arteries - Head CT 3/9 with expected evolution of hemorrhage involving the right thalamus, basal ganglia, and adjacent white matter from prior exam with developing encephalomalacia.  Resolved previous intraventricular hemorrhage.  Persistent sulcal effacement with mild improvement.  Persistent 3 mm left to right midline shift.  Multiple areas of low attenuation consistent with infarcts within the central white matter, unchanged in distribution from prior.  (see report) - Per neurology- see last follow up note from 2/25 - [place] likely 2/2 hypertension and nonadherence to meds  - s/p trach/peg on 2/24  - no antithrombotic 2/2 ICH -Obstructive hydrocephalus -> s/p external ventricular catheter placement on 08/27/20 by neurosurgery - EVD clamped on 2/12 and d/c'd on 2/13 -EEG suggestive of cortical dysfunction in right hemisphere likely 2/2 underlying bleed as well as severe diffuse encephalopathy, nonspecific etiology . -3/21 we will increase baclofen 5 mg toTID.  AST 55 and ALT 59 with a normal total bilirubin last week and will repeat LFTs on Wednesday and Friday of this week.  Neck Contracture/torticollis -Preference for keeping head turned to the left. -See above regarding increase in baclofen dosage on 3/15 -- continue PT/OT  Hypertensive emergency/Malignant HTN Resolved -Home medications include Coreg and HCTZ which have not been reordered -Continue metoprolol, hydralazine  and amlodipine - follow, fluctuating - adjust prn  -Continue hydralazine and labetalol prn with SBP goal < 160 -suspect BP  lability 2/2 pain  Fever  Serratia Pneumonia  Hospital Acquired Pneumonia:  -Tracheal aspirate from 2/25 and 3/1 with serratia - cefepime 2/27-3/5 -There is also concern for central fever, s/p buspirone - s/p treatment for periorbital cellulitis as noted below  Acute hypoxemic respiratory failure secondary to CVA requiring mechanical ventilation  and tracheostomy tube -Stable on trach collar and has been transitioned to a #6 cuffless trach -Has significant altered mentation and inability to manage oral secretions at present time is not a safe candidate for decannulation -Appreciate trach team assistance in management  Bacterial Conjunctivitis/periorbital cellulitis/ - MRI 3/7 orbits with L periorbital cellulitis  -- CT head 3/9 with persistent subtotal opacification of paranasal sinuses and mastoid air cells, bubbly lucency at base of R maxillary sinus (likely odontogenic in origin) - CT maxillofacial 3/12 - improving cellulitis  - s/p erythromycin ointment - completed course of Ancef   Pain likely related to underlying hypertonicity -Consider adding low-dose proxy codon short acting 2.5 mg every 12 hours.  Will first monitor for response to increase in baclofen on 2/21 -Morphine IV and Tylenol prn (watch elev LFTs) -Treat underlying hypertonicity with skeletal muscle relaxers and uptitrate as tolerated with goal to improve and resolve hypertonicity without contributing to oversedation  Dysphagia/Nutrition Status: Nutrition Problem: Inadequate oral intake Etiology: inability to eat Signs/Symptoms: NPO status Interventions: Tube feeding consisting of Osmolite 1.2 Cal at 55 cc/h; continue Prosource liquid 45 cc daily, free water 100 cc every 4 hours per tube; will also continue to follow CBGs regularly while on tube feedings and provide SSI Estimated body mass index is 24.84 kg/m as calculated from the following:   Height as of this encounter: '5\' 5"'  (1.651 m).   Weight as of this encounter: 67.7 kg.    Other problems: Anasarca -bilateral upper extremity edema, minimal LE edema - suspect related to prolonged hospitalization, hypoalbuminemia -upper extremity US-> age indeterminate SVT of right cephalic vein  Elevated LFT's - fluctuating, continue to monitor - follow - w/u further as indicated - continue statin for  now  Leukocytosis -Resolved on antibiotics  Anemia -Stable, follow  Sinus Tachycardia Follow on metop, persistent and relatively intermittent, possibly related to intermittent fevers (central fevers) Continue to monitor, workup as indicated Echo with EF 60-65% (see report)   Data Reviewed: Basic Metabolic Panel: Recent Labs  Lab 09/29/20 0223 10/02/20 0733 10/03/20 0147  NA 138 137 141  K 3.6 4.0 4.9  CL 102 100 103  CO2 '28 25 31  ' GLUCOSE 127* 163* 106*  BUN 22* 20 20  CREATININE 0.77 0.83 0.72  CALCIUM 9.1 8.8* 9.4  MG 1.9  --  2.1  PHOS 4.9*  --  4.6   Liver Function Tests: Recent Labs  Lab 09/29/20 0223 10/02/20 0733 10/03/20 0147  AST 55* 78* 131*  ALT 59* 100* 157*  ALKPHOS 100 96 117  BILITOT 0.5 0.6 0.4  PROT 6.5 6.8 7.6  ALBUMIN 2.3* 2.4* 2.7*   No results for input(s): LIPASE, AMYLASE in the last 168 hours. No results for input(s): AMMONIA in the last 168 hours. CBC: Recent Labs  Lab 09/29/20 0223 10/02/20 0733 10/03/20 0147  WBC 9.0 10.4 10.9*  NEUTROABS 6.5  --   --   HGB 8.7* 8.9* 10.1*  HCT 27.0* 28.4* 32.0*  MCV 81.1 82.6 82.5  PLT 339 345 396   Cardiac Enzymes: No results for input(s): CKTOTAL, CKMB, CKMBINDEX, TROPONINI in the last 168 hours. BNP (last 3 results) No results for input(s):  BNP in the last 8760 hours.  ProBNP (last 3 results) No results for input(s): PROBNP in the last 8760 hours.  CBG: Recent Labs  Lab 10/04/20 1129 10/04/20 1509 10/04/20 1938 10/04/20 2336 10/05/20 0338  GLUCAP 151* 101* 117* 157* 115*    Recent Results (from the past 240 hour(s))  Culture, blood (Routine X 2) w Reflex to ID Panel     Status: None (Preliminary result)   Collection Time: 10/02/20  7:38 AM   Specimen: BLOOD  Result Value Ref Range Status   Specimen Description BLOOD RIGHT ANTECUBITAL  Final   Special Requests   Final    BOTTLES DRAWN AEROBIC AND ANAEROBIC Blood Culture results may not be optimal due to an inadequate  volume of blood received in culture bottles   Culture   Final    NO GROWTH 3 DAYS Performed at [facility], [place] [address], [place], [place] [address]    Report Status PENDING  Incomplete  Culture, blood (Routine X 2) w Reflex to ID Panel     Status: None (Preliminary result)   Collection Time: 10/02/20  7:42 AM   Specimen: BLOOD RIGHT HAND  Result Value Ref Range Status   Specimen Description BLOOD RIGHT HAND  Final   Special Requests   Final    BOTTLES DRAWN AEROBIC ONLY Blood Culture adequate volume   Culture   Final    NO GROWTH 3 DAYS Performed at [facility], [place] [address] [place], [place] [address]    Report Status PENDING  Incomplete     Studies: No results found.  Scheduled Meds: . amLODipine  5 mg Per Tube Daily  . atorvastatin  40 mg Per Tube Daily  . baclofen  5 mg Per Tube BID  . chlorhexidine gluconate (MEDLINE KIT)  15 mL Mouth Rinse BID  . Chlorhexidine Gluconate Cloth  6 each Topical Daily  . enoxaparin (LOVENOX) injection  40 mg Subcutaneous Q24H  . feeding supplement (PROSource TF)  45 mL Per Tube Daily  . free water  100 mL Per Tube Q4H  . hydrALAZINE  50 mg Per Tube Q8H  . insulin aspart  0-9 Units Subcutaneous Q4H  . mouth rinse  15 mL Mouth Rinse 10 times per day  . metoprolol tartrate  100 mg Per Tube BID  . pantoprazole sodium  40 mg Per Tube BID  . scopolamine  1 patch Transdermal Q72H   Continuous Infusions: . sodium chloride Stopped (09/28/20 1344)  . feeding supplement (OSMOLITE 1.2 CAL) 55 mL/hr at 10/04/20 0700    Principal Problem:   ICH (intracerebral hemorrhage) (HCC) Active Problems:   Intraparenchymal hemorrhage of brain (HCC)   Acute hypoxemic respiratory failure (HCC)   Fever   Endotracheal tube present   Status post tracheostomy ([place])   Palliative care by specialist   Muscle hypertonicity   Oropharyngeal dysphagia   Brain injury with loss of consciousness [facility])   Consultants:  Palliative  care  Pulm  Neurology   Procedures:  Tracheostomy  PEG tube  EEG  Echocardiogram  Antibiotics: Anti-infectives (From admission, onward)   Start     Dose/Rate Route Frequency Ordered Stop   09/21/20 1545  ceFAZolin (ANCEF) IVPB 1 g/50 mL premix        1 g 100 mL/hr over 30 Minutes Intravenous Every 8 hours 09/21/20 1457 09/28/20 0647   09/20/20 0600  ceFAZolin (ANCEF) IVPB 1 g/50 mL premix  Status:  Discontinued  1 g 100 mL/hr over 30 Minutes Intravenous Every 8 hours 09/14/20 0903 09/14/20 0904   09/16/20 1300  vancomycin (VANCOREADY) IVPB 1250 mg/250 mL  Status:  Discontinued        1,250 mg 166.7 mL/hr over 90 Minutes Intravenous Every 24 hours 09/15/20 1232 09/17/20 0956   09/15/20 1230  vancomycin (VANCOREADY) IVPB 1250 mg/250 mL        1,250 mg 166.7 mL/hr over 90 Minutes Intravenous  Once 09/15/20 1131 09/15/20 1402   09/13/20 1400  ceFEPIme (MAXIPIME) 2 g in sodium chloride 0.9 % 100 mL IVPB        2 g 200 mL/hr over 30 Minutes Intravenous Every 8 hours 09/13/20 1341 09/19/20 2208   09/06/20 0600  vancomycin (VANCOREADY) IVPB 1250 mg/250 mL        1,250 mg 166.7 mL/hr over 90 Minutes Intravenous  Once 09/06/20 0541 09/06/20 0739   09/05/20 1400  ceFAZolin (ANCEF) IVPB 1 g/50 mL premix  Status:  Discontinued        1 g 100 mL/hr over 30 Minutes Intravenous Every 8 hours 09/05/20 1120 09/13/20 1341   08/30/20 0600  vancomycin (VANCOREADY) IVPB 500 mg/100 mL  Status:  Discontinued        500 mg 100 mL/hr over 60 Minutes Intravenous Every 12 hours 08/29/20 1554 09/01/20 1115   08/29/20 1700  ceFEPIme (MAXIPIME) 2 g in sodium chloride 0.9 % 100 mL IVPB  Status:  Discontinued        2 g 200 mL/hr over 30 Minutes Intravenous Every 12 hours 08/29/20 1554 09/01/20 1115   08/29/20 1645  vancomycin (VANCOREADY) IVPB 1250 mg/250 mL        1,250 mg 166.7 mL/hr over 90 Minutes Intravenous  Once 08/29/20 1554 08/29/20 1820       Time spent: 35  minutes    Erin Hearing ANP  [facility] 7 am - 330 pm/M-F for direct patient care and secure chat Please refer to Amion for contact info 39  days

## 2020-10-05 NOTE — TOC Progression Note (Signed)
Transition of Care [facility]) - Progression Note    Patient Details  Name: Charlene [name] MRN: [id number] Date of Birth: March 10, 1966  Transition of [facility]) CM/SW Contact  Janae Bridgeman, RN Phone Number: 10/05/2020, 12:19 PM  Clinical Narrative:    Case management spoke with the patient's mother, [name], at the bedside to discuss transitions of care.  The patient's mother had a copy of the Healthcare Power of Attorney in her hands in the room but was politely willing to share of copy of the document with TOC team at the bedside "until the family was able to meet together and discuss patient's needed care after discharge from the hospital.  The patient's mother states that the family was hesitant to place the patient in a skilled nursing rehab and/or nursing home and directed the [facility] team to call and speak to the patient's daughter, [name], on the phone.  CM and MSW called and spoke with [name] on the phone concerning transitions of care and she was reluctant to make decisions regarding placement in a nursing home and states the family needs a few days to discuss plans for the patient.  The daughter, [name] was tearful on the phone after speaking with Junious Silk, NP and expressed that the family may choose to take the patient home for care versus having skilled nursing home placement.    The patient's daughter states that she plans to meet with her family this week and make a decision regarding transitions of care by Wednesday, 10/07/2020 and will give the nursing staff a copy of the healthcare power of attorney by this date as well.  CM and MSW will continue to follow the patient for discharge planning - most likely home with care from family.     Expected Discharge Plan: Skilled Nursing Facility Barriers to Discharge: Continued Medical Work up  Expected Discharge Plan and Services Expected Discharge Plan: Skilled Nursing Facility In-house Referral: Clinical Social Work Discharge  Planning Services: CM Consult   Living arrangements for the past 2 months: Single Family Home                                       Social Determinants of Health (SDOH) Interventions    Readmission Risk Interventions Readmission Risk Prevention Plan 09/29/2020  Transportation Screening Complete  PCP or Specialist Appt within 3-5 Days Complete  HRI or Home Care Consult Complete  Social Work Consult for Recovery Care Planning/Counseling Complete  Palliative Care Screening Complete  Medication Review [employer]) Complete  Some recent data might be hidden

## 2020-10-05 NOTE — Plan of Care (Signed)
  Problem: Education: Goal: Knowledge of disease or condition will improve Outcome: Not Progressing Goal: Knowledge of secondary prevention will improve Outcome: Not Progressing   Problem: Education: Goal: Knowledge of patient specific risk factors addressed and post discharge goals established will improve Outcome: Progressing   Problem: Coping: Goal: Will verbalize positive feelings about self Outcome: Progressing Goal: Will identify appropriate support needs Outcome: Progressing   Problem: Self-Care: Goal: Verbalization of feelings and concerns over difficulty with self-care will improve Outcome: Progressing   Problem: Education: Goal: Knowledge of General Education information will improve Description: Including pain rating scale, medication(s)/side effects and non-pharmacologic comfort measures Outcome: Progressing

## 2020-10-05 NOTE — Progress Notes (Addendum)
NAME:  Charlene [name], MRN:  [id number], DOB:  May 26, 1966, LOS: 39 ADMISSION DATE:  08/26/2020, CONSULTATION DATE:  2/10 REFERRING MD:  Dr. Roda Shutters, CHIEF COMPLAINT:  ICH   Brief History:  55 year old female admitted with hypertensive R thalamic/basal ganglia ICH. Resultant obstructive hydrocephalus prompting EVD placement 2/10. Subsequently, 2/10 she had acute mental status change to obtundation and respiratory arrest resulting in intubation. Trach and Peg 2/24. Trach change to #6 cuffless on 3/11  Past Medical History:  HTN, medication noncompliance  Significant Hospital Events:  2/09 Admit for ICH 2/10 EVD placed, later intubated due to obtundation and respiratory arrest 2/11 MRI Brain, hypertonic saline d/c'd, EVD not working.  Husband did not consent to PICC  2/13 EVD removed 2/17 GOC discussion with family, PMT 3/11 trach changed to Shiley 6 uncuffed  Consults:  Stroke PCCM Neurosurgery  Procedures:  ETT 2/10 >> EVD 2/10 > non functioning 2/11 at 1500; clamped 2/12 am > 2/13 Trach and diagnostic Bronchoscopy 2/24>> Dr. Denese Killings PEG placement 2/24>> Dr. Sheliah Hatch  Significant Diagnostic Tests:  CT Head 2/9 >> Acute hemorrhage with the epicenter in the right thalamus. Measurement is approximately 3.2 x 2.1 x 2.5 cm (volume 8.8 cm^3). Intraventricular penetration with blood filling the third and fourth ventricles and nearly filling the right lateral ventricle. No hydrocephalus. CTA Head/Neck 2/10 >> Unchanged size of intraparenchymal hemorrhage centered in the right thalamus and basal ganglia with extension into the right lateral ventricle. 2. No intracranial arterial occlusion or high-grade stenosis. No dissection, aneurysm or hemodynamically significant stenosis of the carotid or vertebral arteries. CT Head 2/10 >> Stable parenchymal hemorrhage in the right thalamus and basal ganglia with intraventricular extension as described. No new focal abnormality is noted. CT Head 2/10 (post  intubation) >> redemonstrated intraparenchymal hemorrhage centered within the R thalamus/basal ganglia extending into the cerebral peduncle/midbrain with associated intraventricular extension and hemorrhage in the R lateral ventricle and fourth ventricle, overall not significantly changed from prior; stable mass effect, 77mm of R to L midline shift, postsurgical changes of ventriculostomy TTE 2/10 >> LVEF 60 to 65%, LV has normal function, no regional wall motion abnormalities, mild concentric LVH.  RV systolic function is normal, normal pulmonary artery systolic pressure.  MRA head 2/11 >>Negative intracranial MR angiography. No evidence of aneurysm or high flow vascular malformation to explain the intraparenchymal hemorrhage  MRI brain 2/11 >> Similar intraparenchymal hemorrhage centered within the right thalamus and basal ganglia extending into the right midbrain with intraventricular extension, effacement of the prepontine cistern and similar 4 mm of leftward midline shift; separate areas of restricted diffusion and edema involving the R > L mesial temporal lobes/parahippocampal cortex, bilateral basal ganglia, and possibly the upper pons that are c/f acute infarcts, most likely secondary to mass effect from hemorrhage. MRI brain 2/19 >> intraventricular clot has decreased in size but mid-brain parenchymal clot has not and there are more ischemic infarcts in the surrounding brain.  CT Head WO Contrast 09/07/20>> Expected evolution of intracranial hemorrhage since 08/30/2020 head CT. Multiple areas of low attenuation corresponding to some of the infarcts seen on prior MRI. Ventricle caliber similar to prior MRI   Micro Data:  2/9 COVID + Influenza negative 2/10 MRSA PCR negative 2/12 BCx 2 > no growth  2/19 BC 1/2 +Stap EPI, 1/2 no growth   Antimicrobials:  Cefepime 2/12 > 2/15 Vanc 2/12 > 2/15  Interim History / Subjective:  Neurological exam unchanged, on t collar with thickened trach  secretions this am.  Objective   Blood pressure 129/77, pulse 87, temperature 98.9 F (37.2 C), temperature source Axillary, resp. rate 19, height 5\' 5"  (1.651 m), weight 67.7 kg, last menstrual period 03/03/2014, SpO2 100 %, unknown if currently breastfeeding.    FiO2 (%):  [21 %] 21 %   Intake/Output Summary (Last 24 hours) at 10/05/2020 0820 Last data filed at 10/04/2020 2140 Gross per 24 hour  Intake 385 ml  Output 300 ml  Net 85 ml   Filed Weights   09/29/20 0500 09/30/20 0445 10/01/20 0500  Weight: 66.8 kg 68.2 kg 67.7 kg   General:  Female without distress and minimally responsive Neuro:  Eyes open spontaneously, right gaze preference, does not follow commands, w/d in extremities HEENT:  #6  cuffless in place, trach midline, no JVD Cardiovascular:  RRR, no murmur, warm and well perfused, no edema Lungs:  Scattered rhonchi, thick yellow secretions via trach Abdomen:  Soft, non-distended, non-tender, G tube Musculoskeletal:  No acute deformity Skin:  Intact, MMM   Resolved Hospital Problem list   Hypernatremia  Obstructive hydrocephalus (resolved) Hypertensive emergency Acute hypoxemic respiratory failure secondary to CVA Serratia PNA  Bacterial Conjunctivitis   Assessment & Plan:   10/03/20 dependent s/p Multiple acute strokes c/b Intraparenchymal hemorrhage with right thalamic hemorrhage with extension into the third fourth and right lateral ventricle Dysphagia s/p PEG Anemia    Active Pulm problem list  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  Thickened yellow secretions with suctioning, low grade temp persistent.  Obtain CBC to eval WBC, CXR to r/o infiltrate, send respiratory culture.  Concern for developing PNA vs tracheobronchitis. Patient with unchanged oxygen requirements, will hold on addition of  antibiotics at this time.   - Routine trach care with trach change every 12 weeks. Pulmonary hygiene with CPT. Trach downsized on 3/11.  Continue to evaluate for further downsizing, neurological exam with neck contracture would exclude patient from decannulation.    5/11, ACNP  Pulmonary & Critical Care  See Amion for personal pager PCCM on call pager [phone] until 7pm. Please call Elink 7p-7a. [phone]  10/05/2020 8:20 AM

## 2020-10-06 ENCOUNTER — Inpatient Hospital Stay (HOSPITAL_COMMUNITY): Payer: Medicaid Other

## 2020-10-06 DIAGNOSIS — Z789 Other specified health status: Secondary | ICD-10-CM

## 2020-10-06 LAB — COMPREHENSIVE METABOLIC PANEL WITH GFR
ALT: 111 U/L — ABNORMAL HIGH (ref 0–44)
AST: 70 U/L — ABNORMAL HIGH (ref 15–41)
Albumin: 2.7 g/dL — ABNORMAL LOW (ref 3.5–5.0)
Alkaline Phosphatase: 112 U/L (ref 38–126)
Anion gap: 10 (ref 5–15)
BUN: 18 mg/dL (ref 6–20)
CO2: 29 mmol/L (ref 22–32)
Calcium: 9.7 mg/dL (ref 8.9–10.3)
Chloride: 101 mmol/L (ref 98–111)
Creatinine, Ser: 0.71 mg/dL (ref 0.44–1.00)
GFR, Estimated: >60 mL/min
Glucose, Bld: 116 mg/dL — ABNORMAL HIGH (ref 70–99)
Potassium: 4 mmol/L (ref 3.5–5.1)
Sodium: 140 mmol/L (ref 135–145)
Total Bilirubin: 0.6 mg/dL (ref 0.3–1.2)
Total Protein: 7.6 g/dL (ref 6.5–8.1)

## 2020-10-06 LAB — GLUCOSE, CAPILLARY
Glucose-Capillary: 127 mg/dL — ABNORMAL HIGH (ref 70–99)
Glucose-Capillary: 116 mg/dL — ABNORMAL HIGH (ref 70–99)
Glucose-Capillary: 104 mg/dL — ABNORMAL HIGH (ref 70–99)
Glucose-Capillary: 124 mg/dL — ABNORMAL HIGH (ref 70–99)
Glucose-Capillary: 134 mg/dL — ABNORMAL HIGH (ref 70–99)
Glucose-Capillary: 198 mg/dL — ABNORMAL HIGH (ref 70–99)

## 2020-10-06 NOTE — Progress Notes (Signed)
Patient ID: RHIANON ZABAWA, female   DOB: [birth year], 55 y.o.   MRN: [id number]  Medical records reviewed   55 year old female with past medical history as below, which is significant for uncontrolled  hypertension and reported medication noncompliance. Presented to the emergency department at [facility] 2/9 with c/o headache and left-sided weakness. STAT CT Head demonstrated right thalamic/basal ganglia hemorrhage with intraventricular extension. She was transferred to [facility] emergency department for in person neurology evaluation. There was evidence of obstructive hydrocephalus and neurosurgery was consulted. External ventricular drain was placed 2/10.In the hours following she became increasingly obtunded. PCCM was consulted. Upon PCCM arrival the patient became apneic and was intubated. EVD removed 2/13     Family meeting held 09-03-20     Trach/PEG 09-10-20 Hospital course complicated by persistent fever,Serratia pneumoniaand now pre-orbital cellulitis  Today is day 40 of this hospital stay    Patient remains minimally responsive,  unable to follow commands.  Mother at bedside.  Made myself available for questions and concerns are conversation regarding her daughter's current medical situation.  She tells me she has no questions and thanks me for coming by.  Likely long term poor prognosis for meaningful recovery.  Discussion and collaboration with Carol/LCSW, Allsion NP   PMT will continue to support holistically and support family as needed  Total time spent on the unit was 15  minutes    Greater than 50% of the time was spent in counseling and coordination of care   Lorinda Creed NP  Palliative Medicine Team Team Phone # [phone] Pager [phone]

## 2020-10-06 NOTE — Procedures (Signed)
EEG Report Indication: possible seizure activity  This study was recorded in the waking/drowsy state.  The duration of the study was 23 minutes.  Electrodes were placed according to the International 10/20 system.  Video was reviewed for clinical correlation as needed.   In the waking state, some degree of background organization is intermittently seen with a very attenuated but perceptible anterior - posterior voltage and frequency gradient.  In the occipital leads there is a symmetric and reactive posterior dominant rhythm of approximately 6-7 hertz, which is slower than expected for age, very poorly sustained and indistinct.  Anteriorly, is the expected pattern of faster frequency, lower voltage waveforms though in a significantly reduced amount as compared to normal, and with some delta and theta slowing.  The gradient is slightly more diminished and the posterior dominant rhythm less distinct in the right hemisphere, and there is a region of increased slowing in the right central region.   During the drowsy state, there is attenuation of the gradient, a general shift to slower frequencies diffusely to the theta range/diminished alpha, a waxing and waning of the posterior dominant rhythm, and slow roving eye movements.    Hyperventilation: Deferred Photic stimulation: Unremarkable  There are no clear paroxysmal or epileptiform abnormalities seen during this portion of the recording.   Impression:   This is an abnormal waking and drowsy study due to very attenuated organization as described above--clinically most consistent with a mild-moderate diffuse encephalopathy, as well as subtly diminished right hemispheric organization and increased slowing in the right central region suggestive of increased focal neuronal dysfunction.  There are no clear epileptiform abnormalities.

## 2020-10-06 NOTE — Plan of Care (Signed)
Not progressing at this time due to altered mental status.   Problem: Education: Goal: Knowledge of disease or condition will improve Outcome: Not Progressing Goal: Knowledge of secondary prevention will improve Outcome: Not Progressing Goal: Knowledge of patient specific risk factors addressed and post discharge goals established will improve Outcome: Not Progressing Goal: Individualized Educational Video(s) Outcome: Not Progressing   Problem: Coping: Goal: Will verbalize positive feelings about self Outcome: Not Progressing Goal: Will identify appropriate support needs Outcome: Not Progressing   Problem: Health Behavior/Discharge Planning: Goal: Ability to manage health-related needs will improve Outcome: Not Progressing   Problem: Self-Care: Goal: Ability to participate in self-care as condition permits will improve Outcome: Not Progressing Goal: Verbalization of feelings and concerns over difficulty with self-care will improve Outcome: Not Progressing Goal: Ability to communicate needs accurately will improve Outcome: Not Progressing   Problem: Nutrition: Goal: Risk of aspiration will decrease Outcome: Not Progressing Goal: Dietary intake will improve Outcome: Not Progressing   Problem: Intracerebral Hemorrhage Tissue Perfusion: Goal: Complications of Intracerebral Hemorrhage will be minimized Outcome: Not Progressing   Problem: Education: Goal: Knowledge of General Education information will improve Description: Including pain rating scale, medication(s)/side effects and non-pharmacologic comfort measures Outcome: Not Progressing   Problem: Health Behavior/Discharge Planning: Goal: Ability to manage health-related needs will improve Outcome: Not Progressing   Problem: Clinical Measurements: Goal: Ability to maintain clinical measurements within normal limits will improve Outcome: Not Progressing Goal: Will remain free from infection Outcome: Not  Progressing Goal: Diagnostic test results will improve Outcome: Not Progressing Goal: Respiratory complications will improve Outcome: Not Progressing Goal: Cardiovascular complication will be avoided Outcome: Not Progressing   Problem: Activity: Goal: Risk for activity intolerance will decrease Outcome: Not Progressing   Problem: Nutrition: Goal: Adequate nutrition will be maintained Outcome: Not Progressing   Problem: Coping: Goal: Level of anxiety will decrease Outcome: Not Progressing   Problem: Elimination: Goal: Will not experience complications related to bowel motility Outcome: Not Progressing Goal: Will not experience complications related to urinary retention Outcome: Not Progressing

## 2020-10-06 NOTE — Progress Notes (Addendum)
[facility] PROGRESS NOTE  Charlene [name] EBR:[id number] DOB: 1965/10/18 DOA: 08/26/2020 PCP: Patient, [name]  Status: Remains inpatient appropriate because:Altered mental status, Unsafe d/c plan, IV treatments appropriate due to intensity of illness or inability to take PO and Inpatient level of care appropriate due to severity of illness   Dispo: The patient is from: Home              Anticipated d/c is to: SNF-family does not wish to place patient in SNF.  If this is the only option they would like to consider taking the patient home although this may be a difficult undertaking given current requirement for trach and understandable fears about managing such complex patient in the home environment.  They have been updated as to why this patient currently is not a candidate for aggressive rehabilitative therapies like would be provided at a brain injury recovery rehab.              Patient currently is not medically stable to d/c.   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 Subsequently on 2/10, she developed obtundation and respiratory arrest leading to intubation. She has remained persistently obtunded, now has a trach and a PEG. Hospital course complicated by persistent fever,Serratia pneumoniaand now pre-orbital cellulitis  2/09 Admit for ICH 2/10 EVD placed, later intubated due to obtundation and respiratory arrest 2/11 MRI Brain, hypertonic saline d/c'd, EVD not working. Husband did not consent to PICC  2/13 EVD removed 2/17 [place] discussion with family, PMT 2/24 Bedside trach and  PEG 3/1 Transferred to [facility]  Subjective: Patient awake with left open.  No attempts at verbal response.  Was unable to follow my finger with her left arm.  Did not have any purposeful movement of extremities when directed to do so.  Mother at bedside.  Discussed at length with her multiple discharge options and explained clinical rationale why patient not a candidate for aggressive brain injury rehab at this point.  We both agreed that primary problem medication for discharge home is requirement for tracheostomy tube.  Again she reiterates that family does not want patient placed in a skilled nursing facility.  Later in the day I called patient's [name]..  She had questions about any medications to utilize to wake patient up.  Explained that these have been utilized early in the hospitalization and unfortunately patient had side effects prompting [name] to withdraw the medications nonetheless while the medications continues that he did not show any improvement in patient's mentation.  She also had questions as to how we can gauge her mental status.  Again explained to her that there is no actual continuous monitoring as such but that regular neurological checks that include any concerning changes in physical exam such as pupil asymmetry or change in baseline alertness would prompt [name] to perform imaging.  Discussed EEG that was performed in February that revealed encephalopathy but no seizures.  Discussed with daughter that I would go ahead and repeat EEG since this has not been done in at least 1 month.  Objective: Vitals:   10/06/20 0630 10/06/20 0744  BP: 120/77 138/85  Pulse: 78 94  Resp: 15  17  Temp: 98.8 F (37.1 C) 99.1 F (37.3 C)  SpO2: 100% 100%   No intake or output data in the 24 hours ending 10/06/20 0808 Filed Weights   09/29/20 0500 09/30/20 0445 10/01/20 0500  Weight: 66.8 kg 68.2 kg 67.7 kg    Exam:  Constitutional: Awake without any meaningful interaction but appears  to be in no acute distress.  Eventually drifted back off to sleep ENT: No periorbital cellulitis Respiratory: #6.0 cuffless trach, trach collar with FiO2 21%.  Minimal thick greenish-brown secretions apparently coughed out of trach sitting on bottom of dressing Cardiovascular: Heart sounds are normal, pulses regular and intermittently tachycardic, no peripheral edema Abdomen:  PEG tube LBM 3/20 Musculoskeletal: Patient has issues with diffuse hypertonicity without any associated spasticity.  Has torticollis involving the neck with persistent positioning of head towards the left although seems to be improving with recent increase in baclofen last week. Hypertonicity of all 4 extremities noted w/ PROM.  Patient has lower extremity boot splints, also washcloth rolls in both hands Neurologic: CN 2-12 grossly intact although may have some mild upper lid ptosis on the right.  Did not assess blink reflex today.  No spontaneous movement Psychiatric: Awake but not interactive today.  Unable to accurately assess orientation given nonverbal state and noninteractive state.   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 Intraventricular penetration with blood filling the 3rd and forth ventricles and nearly filling the right lateral ventricle.  -MRI 2/11 with intraparenchymal hemorrhage centered within the right thalamus and basal ganglia extending into the right midbrain with intraventricular extension. Effacement of prepontine cistern and similar 4 mm of leftward midline shift. Separate areas of ristricted diffusion and edema involving the right greater than left mesial temporal lobes / parahippocampal cortex, bilateral basal ganglia, and possibly the upper pons that are concerning for acute  infarcts, most likely 2/2 mass effect from the hemorrhage (see report)/ Per neuro "scattered b/l hippocampus, posterior CC, bilateral thalamus, and upper pons, likely due to deep penetrating vessel compression by focal mass-effect".  -2/19 MRI with acute to early subacute infarcts in the bilateral cerebral white matter and right cerebellum, new from 2/11 - interval evolution of multiple other infarcts present on prior MRI, increased hemorrhage along old right EVD tract (see report) - 2/21 head CT with expected evolution of ICH since 2/13 - Echo with normal EF, no intracardiac source of embolism - CTA head/neck without intracranial arterial occlusion or high grade stenosis, no dissection, aneurysm, or hemodynamically significant stenosis of the carotid or vertebral arteries - Head CT 3/9 with expected evolution of hemorrhage involving the right thalamus, basal ganglia, and adjacent white matter from prior exam with developing encephalomalacia.  Resolved previous intraventricular hemorrhage.  Persistent sulcal effacement with mild improvement.  Persistent 3 mm left to right midline shift.  Multiple areas of low attenuation consistent with infarcts within the central white matter, unchanged in distribution from prior.  (see report) - Per neurology- see last follow up note from 2/25 - Major likely 2/2 hypertension and nonadherence to meds  - s/p trach/peg on 2/24  - no antithrombotic 2/2 ICH -Obstructive hydrocephalus -> s/p external ventricular catheter placement on 08/27/20 by neurosurgery - EVD clamped on 2/12 and d/c'd on 2/13 -EEG suggestive of cortical dysfunction in right hemisphere likely 2/2 underlying bleed as well as severe diffuse encephalopathy, nonspecific  etiology . -3/21 increased baclofen 5 mg toTID.  AST 55 and ALT 59 with a normal total bilirubin last week and will repeat LFTs on Wednesday and Friday of this week. -3/22 repeat EEG  Neck Contracture/torticollis -Preference for keeping  head turned to the left. -See above regarding increase in baclofen dosage on 3/15 -- continue PT/OT  Hypertensive emergency/Malignant HTN Resolved -Home medications include Coreg and HCTZ which have not been reordered -Continue metoprolol, hydralazine  and amlodipine - follow, fluctuating - adjust prn  -Continue hydralazine and labetalol prn with SBP goal < 160 -suspect BP lability 2/2 pain  Recurrent Fever  Serratia Pneumonia  Hospital Acquired Pneumonia:  -Tracheal aspirate from 2/25 and 3/1 with serratia - cefepime 2/27-3/5 -There is also concern for central fever, s/p buspirone - s/p treatment for periorbital cellulitis as noted below -TM 102 3/17-CXR unremarkable-tracheal aspirate 3/21 w/o WBCs but abundant GPCs, GNRs and GPCs on Gram stain with rare gram-negative rods on culture appears c/w colonization; TM 3/21 of 99.8  Acute hypoxemic respiratory failure secondary to CVA requiring mechanical ventilation and tracheostomy tube -Stable on trach collar and has been transitioned to a #6 cuffless trach -Has significant altered mentation and inability to manage oral secretions at present time is not a safe candidate for decannulation.  Discussed this with family as major obstacle in discharging to home.  This will also decrease options for SNF placement. -Appreciate trach team assistance in management  Bacterial Conjunctivitis/periorbital cellulitis - MRI 3/7 orbits with L periorbital cellulitis  -- CT head 3/9 with persistent subtotal opacification of paranasal sinuses and mastoid air cells, bubbly lucency at base of R maxillary sinus (likely odontogenic in origin) - CT maxillofacial 3/12 - improving cellulitis  - s/p erythromycin ointment - completed course of Ancef   Pain likely related to underlying hypertonicity -Consider adding low-dose oxycodone short acting 2.5 mg every 12 hours.  Will first monitor for response to increase in baclofen on 3/21 -Morphine IV and Tylenol  prn (watch elev LFTs) -Treat underlying hypertonicity with skeletal muscle relaxers and uptitrate as tolerated with goal to improve and resolve hypertonicity without contributing to oversedation  Dysphagia/Nutrition Status: Nutrition Problem: Inadequate oral intake Etiology: inability to eat Signs/Symptoms: NPO status Interventions: Tube feeding consisting of Osmolite 1.2 Cal at 55 cc/h; continue Prosource liquid 45 cc daily, free water 100 cc every 4 hours per tube; will also continue to follow CBGs regularly while on tube feedings and provide SSI Estimated body mass index is 24.84 kg/m as calculated from the following:   Height as of this encounter: _0  (1.651 m).   Weight as of this encounter: 67.7 kg. If patient eventually discharged to home to teach family how to bolus feed as opposed to utilizing continuous tube feeding infusions.   Other problems: Anasarca -bilateral upper extremity edema, minimal LE edema - suspect related to prolonged hospitalization, hypoalbuminemia -upper extremity US-> age indeterminate SVT of right cephalic vein  Elevated LFT's - fluctuating, continue to monitor - follow - w/u further as indicated - continue statin for now  Leukocytosis -Resolved on antibiotics  Anemia -Stable, follow  Sinus Tachycardia Follow on metop, persistent and relatively intermittent, possibly related to intermittent fevers (central fevers) Continue to monitor, workup as indicated Echo with EF 60-65% (see report)   Data Reviewed: Basic Metabolic Panel: Recent Labs  Lab 10/02/20 0733 10/03/20 0147 10/06/20 0357  NA 137 141 140  K 4.0 4.9 4.0  CL 100 103 101  CO2 _1 GLUCOSE  163* 106* 116*  BUN _0 CREATININE 0.83 0.72 0.71  CALCIUM 8.8* 9.4 9.7  MG  --  2.1  --   PHOS  --  4.6  --    Liver Function Tests: Recent Labs  Lab 10/02/20 0733 10/03/20 0147 10/06/20 0357  AST 78* 131* 70*  ALT 100* 157* 111*  ALKPHOS 96 117 112  BILITOT 0.6  0.4 0.6  PROT 6.8 7.6 7.6  ALBUMIN 2.4* 2.7* 2.7*   No results for input(s): LIPASE, AMYLASE in the last 168 hours. No results for input(s): AMMONIA in the last 168 hours. CBC: Recent Labs  Lab 10/02/20 0733 10/03/20 0147 10/05/20 0835  WBC 10.4 10.9* 7.9  HGB 8.9* 10.1* 9.0*  HCT 28.4* 32.0* 29.4*  MCV 82.6 82.5 82.4  PLT 345 396 377   Cardiac Enzymes: No results for input(s): CKTOTAL, CKMB, CKMBINDEX, TROPONINI in the last 168 hours. BNP (last 3 results) No results for input(s): BNP in the last 8760 hours.  ProBNP (last 3 results) No results for input(s): PROBNP in the last 8760 hours.  CBG: Recent Labs  Lab 10/05/20 1550 10/05/20 1945 10/05/20 2316 10/06/20 0323 10/06/20 0746  GLUCAP 106* 112* 147* 127* 116*    Recent Results (from the past 240 hour(s))  Culture, blood (Routine X 2) w Reflex to ID Panel     Status: None (Preliminary result)   Collection Time: 10/02/20  7:38 AM   Specimen: BLOOD  Result Value Ref Range Status   Specimen Description BLOOD RIGHT ANTECUBITAL  Final   Special Requests   Final    BOTTLES DRAWN AEROBIC AND ANAEROBIC Blood Culture results may not be optimal due to an inadequate volume of blood received in culture bottles   Culture   Final    NO GROWTH 3 DAYS Performed at [facility], [place] [address], [place], [place] [address]    Report Status PENDING  Incomplete  Culture, blood (Routine X 2) w Reflex to ID Panel     Status: None (Preliminary result)   Collection Time: 10/02/20  7:42 AM   Specimen: BLOOD RIGHT HAND  Result Value Ref Range Status   Specimen Description BLOOD RIGHT HAND  Final   Special Requests   Final    BOTTLES DRAWN AEROBIC ONLY Blood Culture adequate volume   Culture   Final    NO GROWTH 3 DAYS Performed at [facility], [place] [address] [place], [place] [address]    Report Status PENDING  Incomplete  Culture, Respiratory w Gram Stain     Status: None (Preliminary result)   Collection Time:  10/05/20  1:12 PM   Specimen: Tracheal Aspirate; Respiratory  Result Value Ref Range Status   Specimen Description TRACHEAL ASPIRATE  Final   Special Requests NONE  Final   Gram Stain   Final    NO WBC SEEN ABUNDANT GRAM POSITIVE COCCI ABUNDANT GRAM NEGATIVE RODS MODERATE GRAM POSITIVE RODS Performed at [facility], [place] [address], [place], [place] [address]    Culture PENDING  Incomplete   Report Status PENDING  Incomplete     Studies: DG CHEST PORT 1 VIEW  Result Date: 10/05/2020 CLINICAL DATA:  Increased tracheal secretions. EXAM: PORTABLE CHEST 1 VIEW COMPARISON:  10/02/2020 FINDINGS: 0851 hours. Tracheostomy tube again noted. The lungs are clear without focal pneumonia, edema, pneumothorax or pleural effusion. The cardiopericardial silhouette is within normal limits for size. The visualized bony structures of the thorax show no  acute abnormality. Telemetry leads overlie the chest. IMPRESSION: No acute cardiopulmonary findings. Electronically Signed   By: Misty Stanley M.D.   On: 10/05/2020 09:07    Scheduled Meds: . amLODipine  5 mg Per Tube Daily  . atorvastatin  40 mg Per Tube Daily  . baclofen  5 mg Per Tube BID  . chlorhexidine gluconate (MEDLINE KIT)  15 mL Mouth Rinse BID  . Chlorhexidine Gluconate Cloth  6 each Topical Daily  . enoxaparin (LOVENOX) injection  40 mg Subcutaneous Q24H  . feeding supplement (PROSource TF)  45 mL Per Tube Daily  . free water  100 mL Per Tube Q4H  . hydrALAZINE  50 mg Per Tube Q8H  . insulin aspart  0-9 Units Subcutaneous Q4H  . mouth rinse  15 mL Mouth Rinse 10 times per day  . metoprolol tartrate  100 mg Per Tube BID  . pantoprazole sodium  40 mg Per Tube BID  . scopolamine  1 patch Transdermal Q72H   Continuous Infusions: . sodium chloride Stopped (09/28/20 1344)  . feeding supplement (OSMOLITE 1.2 CAL) 55 mL/hr at 10/04/20 0700    Principal Problem:   ICH (intracerebral hemorrhage) (HCC) Active Problems:    Intraparenchymal hemorrhage of brain (HCC)   Acute hypoxemic respiratory failure (HCC)   Fever   Endotracheal tube present   Status post tracheostomy ([place])   Palliative care by specialist   Muscle hypertonicity   Oropharyngeal dysphagia   Brain injury with loss of consciousness [facility])   Consultants:  Palliative care  Pulm  Neurology   Procedures:  Tracheostomy  PEG tube  EEG  Echocardiogram  Antibiotics: Anti-infectives (From admission, onward)   Start     Dose/Rate Route Frequency Ordered Stop   09/21/20 1545  ceFAZolin (ANCEF) IVPB 1 g/50 mL premix        1 g 100 mL/hr over 30 Minutes Intravenous Every 8 hours 09/21/20 1457 09/28/20 0647   09/20/20 0600  ceFAZolin (ANCEF) IVPB 1 g/50 mL premix  Status:  Discontinued        1 g 100 mL/hr over 30 Minutes Intravenous Every 8 hours 09/14/20 0903 09/14/20 0904   09/16/20 1300  vancomycin (VANCOREADY) IVPB 1250 mg/250 mL  Status:  Discontinued        1,250 mg 166.7 mL/hr over 90 Minutes Intravenous Every 24 hours 09/15/20 1232 09/17/20 0956   09/15/20 1230  vancomycin (VANCOREADY) IVPB 1250 mg/250 mL        1,250 mg 166.7 mL/hr over 90 Minutes Intravenous  Once 09/15/20 1131 09/15/20 1402   09/13/20 1400  ceFEPIme (MAXIPIME) 2 g in sodium chloride 0.9 % 100 mL IVPB        2 g 200 mL/hr over 30 Minutes Intravenous Every 8 hours 09/13/20 1341 09/19/20 2208   09/06/20 0600  vancomycin (VANCOREADY) IVPB 1250 mg/250 mL        1,250 mg 166.7 mL/hr over 90 Minutes Intravenous  Once 09/06/20 0541 09/06/20 0739   09/05/20 1400  ceFAZolin (ANCEF) IVPB 1 g/50 mL premix  Status:  Discontinued        1 g 100 mL/hr over 30 Minutes Intravenous Every 8 hours 09/05/20 1120 09/13/20 1341   08/30/20 0600  vancomycin (VANCOREADY) IVPB 500 mg/100 mL  Status:  Discontinued        500 mg 100 mL/hr over 60 Minutes Intravenous Every 12 hours 08/29/20 1554 09/01/20 1115   08/29/20 1700  ceFEPIme (MAXIPIME) 2 g in sodium chloride 0.9 % 100  mL IVPB  Status:  Discontinued        2 g 200 mL/hr over 30 Minutes Intravenous Every 12 hours 08/29/20 1554 09/01/20 1115   08/29/20 1645  vancomycin (VANCOREADY) IVPB 1250 mg/250 mL        1,250 mg 166.7 mL/hr over 90 Minutes Intravenous  Once 08/29/20 1554 08/29/20 1820       Time spent: 35 minutes    Erin Hearing ANP  [facility] 7 am - 330 pm/M-F for direct patient care and secure chat Please refer to Amion for contact info 40  days

## 2020-10-06 NOTE — Progress Notes (Signed)
EEG complete - results pending 

## 2020-10-06 NOTE — Progress Notes (Signed)
Physical Therapy Treatment Patient Details Name: Charlene [name] MRN: [id number] DOB: 1965-12-31 Today's Date: 10/06/2020    History of Present Illness Pt is 55 yo female presents to [facility] on 2/9 with L sided weakness and headaches. PMH includes anxiety, HTN. 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 s/p Right frontal ventricular catheter placement on 2/10, stopped functioning on 2/11.  MRI 2/11 shows right greater than left mesial temporal lobes / parahippocampal cortex, bilateral basal ganglia, and possibly the upper pons that are concerning for acute infarcts, most likely secondary to mass effect from the hemorrhage. ETT 2/10-present.  Pt with trach and PEG on 09/10/20. Hospital course complicated by persistent fever, Serratia pneumonia and now pre-orbital cellulitis.    PT Comments    Pt received supine in bed, more lethargic this session than last week but L eye opening towards end of session. Pt's mother has been diligent with cervical ROM and LE/UE PROM. Pt neck remains in L cervical rotation but is not as flexed as last week. Worked on pt stimulation in sitting EOB with PROM to BLE's and UE's and R trunk rotation stretching. Pt maintains sitting with mod A at times but then pushes posteriorly requiring max A. Mother present for session and is contemplating taking pt home instead of SNF. Pt would need hospital bed. PT will continue to follow.   Follow Up Recommendations  SNF;Supervision/Assistance - 24 hour     Equipment Recommendations  Hospital bed    Recommendations for Other Services       Precautions / Restrictions Precautions Precautions: Fall Precaution Comments: watch BP and HR, peg and trach Restrictions Weight Bearing Restrictions: No    Mobility  Bed Mobility Overal bed mobility: Needs Assistance Bed Mobility: Rolling;Supine to  Sit;Sit to Supine Rolling: Total assist   Supine to sit: Total assist;+2 for physical assistance Sit to supine: Total assist;+2 for physical assistance   General bed mobility comments: Tot A +2 for SL to sit as well as return to SL. Pt rolled R and L for repositioning, not resistive to rolling, some resistance during SL to sit    Transfers                 General transfer comment: unable  Ambulation/Gait             General Gait Details: unable   Stairs             Wheelchair Mobility    Modified Rankin (Stroke Patients Only) Modified Rankin (Stroke Patients Only) Pre-Morbid Rankin Score: No symptoms Modified Rankin: Severe disability     Balance Overall balance assessment: Needs assistance Sitting-balance support: No upper extremity supported;Feet supported Sitting balance-Leahy Scale: Poor Sitting balance - Comments: sat EOB 20 mins with ROM to BLE's stimulation to face, neck, and back, and ROM of hands. Pt sitting with mod A at times but sometimes with strong posterior lean with max A needed. Noted L trunk torsion, worked on R rotation. Postural control: Posterior lean     Standing balance comment: unable                            Cognition Arousal/Alertness: Lethargic Behavior During Therapy: Flat affect Overall Cognitive Status: Difficult to assess  General Comments: pt blinks L eye at times but more lethargic this session than last      Exercises General Exercises - Upper Extremity Elbow Flexion: 5 reps;Both;PROM Elbow Extension: PROM;Both;5 reps Wrist Extension: PROM;Left;5 reps Composite Extension: PROM General Exercises - Lower Extremity Long Arc Quad: PROM;Both;5 reps;Seated Other Exercises Other Exercises: passive stretch to L sided neck x5 mins    General Comments General comments (skin integrity, edema, etc.): pt's mom doing a good job of PROM, donning RUE splint, and  head positioning. Pt continued to favor L head turn but with less neck flexion today. Pt reflexiveley moving RUE and RLE in supine and sitting.      Pertinent Vitals/Pain Pain Assessment: Faces Faces Pain Scale: Hurts a little bit Pain Location: withdrawls to discomfort R UE> L UE and with cx ROM Pain Descriptors / Indicators: Discomfort Pain Intervention(s): Limited activity within patient's tolerance;Monitored during session    Home Living                      Prior Function            PT Goals (current goals can now be found in the care plan section) Acute Rehab PT Goals Patient Stated Goal: pt unable, non responsive PT Goal Formulation: Patient unable to participate in goal setting Time For Goal Achievement: 10/20/20 Potential to Achieve Goals: Poor Progress towards PT goals: Progressing toward goals    Frequency    Min 1X/week      PT Plan Current plan remains appropriate    Co-evaluation              AM-PAC PT "6 Clicks" Mobility   Outcome Measure  Help needed turning from your back to your side while in a flat bed without using bedrails?: Total Help needed moving from lying on your back to sitting on the side of a flat bed without using bedrails?: Total Help needed moving to and from a bed to a chair (including a wheelchair)?: Total Help needed standing up from a chair using your arms (e.g., wheelchair or bedside chair)?: Total Help needed to walk in hospital room?: Total Help needed climbing 3-5 steps with a railing? : Total 6 Click Score: 6    End of Session Equipment Utilized During Treatment: Oxygen Activity Tolerance: Other (comment) (limited by pt responsiveness) Patient left: in bed;with call bell/phone within reach;with bed alarm set;with SCD's reapplied;with family/visitor present Nurse Communication: Mobility status PT Visit Diagnosis: Hemiplegia and hemiparesis;Other symptoms and signs involving the nervous system (R29.898);Other  abnormalities of gait and mobility (R26.89) Hemiplegia - dominant/non-dominant: Dominant Hemiplegia - caused by: Cerebral infarction;Nontraumatic intracerebral hemorrhage;Other Nontraumatic intracranial hemorrhage     Time: 7001-7494 PT Time Calculation (min) (ACUTE ONLY): 31 min  Charges:  $Therapeutic Activity: 23-37 mins                     Lyanne Co, PT  Acute Rehab Services  Pager [phone] Office [phone]    Charlene [name] 10/06/2020, 2:00 PM

## 2020-10-07 LAB — CULTURE, RESPIRATORY W GRAM STAIN: Gram Stain: NONE SEEN

## 2020-10-07 LAB — CULTURE, BLOOD (ROUTINE X 2)
Culture: NO GROWTH
Culture: NO GROWTH
Special Requests: ADEQUATE

## 2020-10-07 LAB — GLUCOSE, CAPILLARY
Glucose-Capillary: 103 mg/dL — ABNORMAL HIGH (ref 70–99)
Glucose-Capillary: 119 mg/dL — ABNORMAL HIGH (ref 70–99)
Glucose-Capillary: 120 mg/dL — ABNORMAL HIGH (ref 70–99)
Glucose-Capillary: 123 mg/dL — ABNORMAL HIGH (ref 70–99)
Glucose-Capillary: 134 mg/dL — ABNORMAL HIGH (ref 70–99)
Glucose-Capillary: 138 mg/dL — ABNORMAL HIGH (ref 70–99)

## 2020-10-07 NOTE — Plan of Care (Signed)

## 2020-10-07 NOTE — Progress Notes (Signed)
[facility] PROGRESS NOTE  Charlene [name] ERX:[id number] DOB: 1965-10-07 DOA: 08/26/2020 PCP: Patient, [name]  Status: Remains inpatient appropriate because:Altered mental status, Unsafe d/c plan, IV treatments appropriate due to intensity of illness or inability to take PO and Inpatient level of care appropriate due to severity of illness   Dispo: The patient is from: Home              Anticipated d/c is to: SNF-family does not wish to place patient in SNF.  If this is the only option they would like to consider taking the patient home although this may be a difficult undertaking given current requirement for trach and understandable fears about managing such complex patient in the home environment.  They have been updated as to why this patient currently is not a candidate for aggressive rehabilitative therapies like would be provided at a brain injury recovery rehab.  ** Awaiting clarification of POA status.  Patient is married and it is important that we clarify if someone other than the patient's husband (legal next of kin) has been assigned power of attorney.  This is important regarding potential legal issues involving this patient.              Patient currently is not medically stable to d/c.   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 Subsequently on 2/10, she developed obtundation and respiratory arrest leading to intubation. She has remained persistently obtunded, now has a trach and a PEG. Hospital course complicated by persistent fever,Serratia pneumoniaand now pre-orbital  cellulitis  2/09 Admit for ICH 2/10 EVD placed, later intubated due to obtundation and respiratory arrest 2/11 MRI Brain, hypertonic saline d/c'd, EVD not working. Husband did not consent to PICC  2/13 EVD removed 2/17 [place] discussion with family, PMT 2/24 Bedside trach and PEG 3/1 Transferred to [facility]  Subjective: Patient initially sleeping upon entry into room.  With combination of verbal and tactile stimulation patient awakened.  She initially furrowed her brows and seemed to be frowning but eventually calmed down.  Her left eye was focused toward the medial right.  Right eye remained midline without any movement and lid remained shut.  Attempted multiple times to see if patient could follow commands or track with left eye but was unsuccessful.  No spontaneous movement noted either.  No family at bedside.  Patient remained awake during the entire encounter.  Objective: Vitals:   10/07/20 0707 10/07/20 0711  BP:  (!) 141/82  Pulse:  90  Resp: 18 18  Temp:  98.6 F (37 C)  SpO2:  100%    Intake/Output Summary (Last 24 hours) at 10/07/2020 0805 Last data filed at 10/07/2020 4580 Gross per 24 hour  Intake 670 ml  Output 2000 ml  Net -1330 ml   Filed Weights   09/30/20 0445 10/01/20 0500 10/07/20 0305  Weight: 68.2 kg 67.7 kg 62 kg    Exam:  Constitutional: Calm, lethargic, no acute distress ENT: No periorbital cellulitis Respiratory: #6.0 cuffless trach, trach collar with FiO2 21%.  No secretions noted at trach.  No increased work of breathing.  Lung sounds are clear. Cardiovascular: Normal heart sounds, skin warm and  dry, pulse regular Abdomen:  PEG tube with continuous tube feeedings, LBM 3/20 abdomen is soft and nontender with normoactive bowel Musculoskeletal: Torticollis with associated left neck deviation improving.  I was able to extend patient's right arm from flexion to almost complete extension with only minimal complaints of pain.  This maneuver did not precipitate  any spasticity or tremulousness.  No spasticity or significant hypertonicity noted in lower extremities with PROM. Neurologic: CN 2-12 grossly intact except for inability to open R eyelid Psychiatric: To verbal and tactile stimulation but does not engage.  Nonverbal so therefore unable to accurately assess orientation.  Does respond to name.  Unable to follow commands.   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 Intraventricular penetration with blood filling the 3rd and forth ventricles and nearly filling the right lateral ventricle.  -MRI 2/11 with intraparenchymal hemorrhage centered within the right thalamus and basal ganglia extending into the right midbrain with intraventricular extension. Effacement of prepontine cistern and similar 4 mm of leftward midline shift. Separate areas of ristricted diffusion and edema involving the right greater than left mesial temporal lobes / parahippocampal cortex, bilateral basal ganglia, and possibly the upper pons that are concerning for acute infarcts, most likely 2/2 mass effect from the hemorrhage (see report)/ Per neuro "scattered b/l hippocampus, posterior CC, bilateral thalamus, and upper pons, likely due to deep penetrating vessel compression by focal mass-effect".  -2/19 MRI with acute to early subacute infarcts in the bilateral cerebral white matter and right cerebellum, new from 2/11 - interval evolution of multiple other infarcts present on prior MRI, increased hemorrhage along old right EVD tract (see report) - 2/21 head CT with expected evolution of ICH since 2/13 - Echo with normal EF, no intracardiac source of embolism - CTA head/neck without intracranial arterial occlusion or high grade stenosis, no dissection, aneurysm, or  hemodynamically significant stenosis of the carotid or vertebral arteries - Head CT 3/9 with expected evolution of hemorrhage involving the right thalamus, basal ganglia, and adjacent white matter from prior exam with developing encephalomalacia.  Resolved previous intraventricular hemorrhage.  Persistent sulcal effacement with mild improvement.  Persistent 3 mm left to right midline shift.  Multiple areas of low attenuation consistent with infarcts within the central white matter, unchanged in distribution from prior.  (see report) - Per neurology- see last follow up note from 2/25 - [place] likely 2/2 hypertension and nonadherence to meds  - s/p trach/peg on 2/24  - no antithrombotic 2/2 ICH -Obstructive hydrocephalus -> s/p external ventricular catheter placement on 08/27/20 by neurosurgery - EVD clamped on 2/12 and d/c'd on 2/13 -EEG suggestive of cortical dysfunction in right hemisphere likely 2/2 underlying bleed as well as severe diffuse encephalopathy, nonspecific etiology . -3/21 increased baclofen 5 mg toTID.  Upper extremity hypertonicity seems to be improving during PROM.   AST 55 and ALT 59 with a normal total bilirubin last week and will repeat LFTs on Wednesday and Friday of this week. -3/22 repeat EEG  Neck Contracture/torticollis -Preference for keeping head turned to the left. -See above regarding increase in baclofen dosage on 3/15 -- continue PT/OT  Hypertensive emergency/Malignant HTN Resolved -Home medications include Coreg and HCTZ which have not been reordered -Continue metoprolol, hydralazine  and amlodipine - follow, fluctuating - adjust prn  -Continue hydralazine and labetalol prn with SBP goal < 160 -suspect BP  lability 2/2 pain  Recurrent Fever  Serratia Pneumonia  Hospital Acquired Pneumonia:  -Tracheal aspirate from 2/25 and 3/1 with serratia - cefepime 2/27-3/5 -There is also concern for central fever, s/p buspirone - s/p treatment for periorbital cellulitis  as noted below -TM 102 3/17-CXR unremarkable-tracheal aspirate 3/21 w/o WBCs but abundant GPCs, GNRs and GPCs on Gram stain  -Culture positive for rare Serratia marcescens.  Patient at this time asymptomatic without hypoxemia or fever.  Sensitivities are pending.  Consider 5 to 7 days of antibiotics although suspect this is more reflective of colonization  Acute hypoxemic respiratory failure secondary to CVA requiring mechanical ventilation and tracheostomy tube -Stable on trach collar and has been transitioned to a #6 cuffless trach -Has significant altered mentation and inability to manage oral secretions at present time is not a safe candidate for decannulation.  Discussed this with family as major obstacle in discharging to home.  This will also decrease options for SNF placement. -Appreciate trach team assistance in management  Bacterial Conjunctivitis/periorbital cellulitis - MRI 3/7 orbits with L periorbital cellulitis  -- CT head 3/9 with persistent subtotal opacification of paranasal sinuses and mastoid air cells, bubbly lucency at base of R maxillary sinus (likely odontogenic in origin) - CT maxillofacial 3/12 - improving cellulitis  - s/p erythromycin ointment - completed course of Ancef   Pain likely related to underlying hypertonicity -Consider adding low-dose oxycodone short acting 2.5 mg every 12 hours.  Will first monitor for response to increase in baclofen on 3/21 -Morphine IV and Tylenol prn (watch elev LFTs) -Treat underlying hypertonicity with skeletal muscle relaxers and uptitrate as tolerated with goal to improve and resolve hypertonicity without contributing to oversedation  Dysphagia/Nutrition Status: Nutrition Problem: Inadequate oral intake Etiology: inability to eat Signs/Symptoms: NPO status Interventions: Tube feeding consisting of Osmolite 1.2 Cal at 55 cc/h; continue Prosource liquid 45 cc daily, free water 100 cc every 4 hours per tube; will also continue  to follow CBGs regularly while on tube feedings and provide SSI Estimated body mass index is 22.75 kg/m as calculated from the following:   Height as of this encounter: '5\' 5"'  (1.651 m).   Weight as of this encounter: 62 kg. If patient eventually discharged to home to teach family how to bolus feed as opposed to utilizing continuous tube feeding infusions.   Other problems: Anasarca -bilateral upper extremity edema, minimal LE edema - suspect related to prolonged hospitalization, hypoalbuminemia -upper extremity US-> age indeterminate SVT of right cephalic vein  Elevated LFT's - fluctuating, continue to monitor - follow - w/u further as indicated - continue statin for now  Leukocytosis -Resolved on antibiotics  Anemia -Stable, follow  Sinus Tachycardia Follow on metop, persistent and relatively intermittent, possibly related to intermittent fevers (central fevers) Continue to monitor, workup as indicated Echo with EF 60-65% (see report)   Data Reviewed: Basic Metabolic Panel: Recent Labs  Lab 10/02/20 0733 10/03/20 0147 10/06/20 0357  NA 137 141 140  K 4.0 4.9 4.0  CL 100 103 101  CO2 '25 31 29  ' GLUCOSE 163* 106* 116*  BUN '20 20 18  ' CREATININE 0.83 0.72 0.71  CALCIUM 8.8* 9.4 9.7  MG  --  2.1  --   PHOS  --  4.6  --    Liver Function Tests: Recent Labs  Lab 10/02/20 0733 10/03/20 0147 10/06/20 0357  AST 78* 131* 70*  ALT 100* 157* 111*  ALKPHOS 96 117 112  BILITOT 0.6 0.4 0.6  PROT 6.8 7.6  7.6  ALBUMIN 2.4* 2.7* 2.7*   No results for input(s): LIPASE, AMYLASE in the last 168 hours. No results for input(s): AMMONIA in the last 168 hours. CBC: Recent Labs  Lab 10/02/20 0733 10/03/20 0147 10/05/20 0835  WBC 10.4 10.9* 7.9  HGB 8.9* 10.1* 9.0*  HCT 28.4* 32.0* 29.4*  MCV 82.6 82.5 82.4  PLT 345 396 377   Cardiac Enzymes: No results for input(s): CKTOTAL, CKMB, CKMBINDEX, TROPONINI in the last 168 hours. BNP (last 3 results) No results for  input(s): BNP in the last 8760 hours.  ProBNP (last 3 results) No results for input(s): PROBNP in the last 8760 hours.  CBG: Recent Labs  Lab 10/06/20 1623 10/06/20 1913 10/06/20 2316 10/07/20 0306 10/07/20 0743  GLUCAP 124* 134* 198* 119* 134*    Recent Results (from the past 240 hour(s))  Culture, blood (Routine X 2) w Reflex to ID Panel     Status: None   Collection Time: 10/02/20  7:38 AM   Specimen: BLOOD  Result Value Ref Range Status   Specimen Description BLOOD RIGHT ANTECUBITAL  Final   Special Requests   Final    BOTTLES DRAWN AEROBIC AND ANAEROBIC Blood Culture results may not be optimal due to an inadequate volume of blood received in culture bottles   Culture   Final    NO GROWTH 5 DAYS Performed at [facility], [place] [address], [place], [place] [address]    Report Status 10/07/2020 FINAL  Final  Culture, blood (Routine X 2) w Reflex to ID Panel     Status: None   Collection Time: 10/02/20  7:42 AM   Specimen: BLOOD RIGHT HAND  Result Value Ref Range Status   Specimen Description BLOOD RIGHT HAND  Final   Special Requests   Final    BOTTLES DRAWN AEROBIC ONLY Blood Culture adequate volume   Culture   Final    NO GROWTH 5 DAYS Performed at [facility], [place] [address] [place] [address], [place], [place] [address]    Report Status 10/07/2020 FINAL  Final  Culture, Respiratory w Gram Stain     Status: None (Preliminary result)   Collection Time: 10/05/20  1:12 PM   Specimen: Tracheal Aspirate; Respiratory  Result Value Ref Range Status   Specimen Description TRACHEAL ASPIRATE  Final   Special Requests NONE  Final   Gram Stain   Final    NO WBC SEEN ABUNDANT GRAM POSITIVE COCCI ABUNDANT GRAM NEGATIVE RODS MODERATE GRAM POSITIVE RODS    Culture   Final    RARE SERRATIA MARCESCENS SUSCEPTIBILITIES TO FOLLOW Performed at [facility], [place] [address] [place], [place] [address]    Report Status PENDING  Incomplete     Studies: DG CHEST PORT 1  VIEW  Result Date: 10/05/2020 CLINICAL DATA:  Increased tracheal secretions. EXAM: PORTABLE CHEST 1 VIEW COMPARISON:  10/02/2020 FINDINGS: 0851 hours. Tracheostomy tube again noted. The lungs are clear without focal pneumonia, edema, pneumothorax or pleural effusion. The cardiopericardial silhouette is within normal limits for size. The visualized bony structures of the thorax show no acute abnormality. Telemetry leads overlie the chest. IMPRESSION: No acute cardiopulmonary findings. Electronically Signed   By: Misty Stanley M.D.   On: 10/05/2020 09:07   EEG adult  Result Date: 10/06/2020 Raenette Rover, MD     10/06/2020  9:27 PM EEG Report Indication: possible seizure activity This study was recorded in the waking/drowsy state.  The duration of the study was  23 minutes.  Electrodes were placed according to the International 10/20 system.  Video was reviewed for clinical correlation as needed.  In the waking state, some degree of background organization is intermittently seen with a very attenuated but perceptible anterior - posterior voltage and frequency gradient.  In the occipital leads there is a symmetric and reactive posterior dominant rhythm of approximately 6-7 hertz, which is slower than expected for age, very poorly sustained and indistinct.  Anteriorly, is the expected pattern of faster frequency, lower voltage waveforms though in a significantly reduced amount as compared to normal, and with some delta and theta slowing. The gradient is slightly more diminished and the posterior dominant rhythm less distinct in the right hemisphere, and there is a region of increased slowing in the right central region.  During the drowsy state, there is attenuation of the gradient, a general shift to slower frequencies diffusely to the theta range/diminished alpha, a waxing and waning of the posterior dominant rhythm, and slow roving eye movements.  Hyperventilation: Deferred Photic stimulation: Unremarkable  There are no clear paroxysmal or epileptiform abnormalities seen during this portion of the recording.  Impression:  This is an abnormal waking and drowsy study due to very attenuated organization as described above--clinically most consistent with a mild-moderate diffuse encephalopathy, as well as subtly diminished right hemispheric organization and increased slowing in the right central region suggestive of increased focal neuronal dysfunction.  There are no clear epileptiform abnormalities.    Scheduled Meds: . amLODipine  5 mg Per Tube Daily  . atorvastatin  40 mg Per Tube Daily  . baclofen  5 mg Per Tube BID  . chlorhexidine gluconate (MEDLINE KIT)  15 mL Mouth Rinse BID  . Chlorhexidine Gluconate Cloth  6 each Topical Daily  . enoxaparin (LOVENOX) injection  40 mg Subcutaneous Q24H  . feeding supplement (PROSource TF)  45 mL Per Tube Daily  . free water  100 mL Per Tube Q4H  . hydrALAZINE  50 mg Per Tube Q8H  . insulin aspart  0-9 Units Subcutaneous Q4H  . mouth rinse  15 mL Mouth Rinse 10 times per day  . metoprolol tartrate  100 mg Per Tube BID  . pantoprazole sodium  40 mg Per Tube BID  . scopolamine  1 patch Transdermal Q72H   Continuous Infusions: . sodium chloride Stopped (09/28/20 1344)  . feeding supplement (OSMOLITE 1.2 CAL) 1,000 mL (10/06/20 2252)    Principal Problem:   ICH (intracerebral hemorrhage) (HCC) Active Problems:   Intraparenchymal hemorrhage of brain (HCC)   Acute hypoxemic respiratory failure (HCC)   Fever   Endotracheal tube present   Status post tracheostomy ([place])   Palliative care by specialist   Muscle hypertonicity   Oropharyngeal dysphagia   Brain injury with loss of consciousness [facility])   Consultants:  Palliative care  Pulm  Neurology   Procedures:  Tracheostomy  PEG tube  EEG  Echocardiogram  Antibiotics: Anti-infectives (From admission, onward)   Start     Dose/Rate Route Frequency Ordered Stop   09/21/20 1545   ceFAZolin (ANCEF) IVPB 1 g/50 mL premix        1 g 100 mL/hr over 30 Minutes Intravenous Every 8 hours 09/21/20 1457 09/28/20 0647   09/20/20 0600  ceFAZolin (ANCEF) IVPB 1 g/50 mL premix  Status:  Discontinued        1 g 100 mL/hr over 30 Minutes Intravenous Every 8 hours 09/14/20 0903 09/14/20 0904   09/16/20 1300  vancomycin (VANCOREADY) IVPB  1250 mg/250 mL  Status:  Discontinued        1,250 mg 166.7 mL/hr over 90 Minutes Intravenous Every 24 hours 09/15/20 1232 09/17/20 0956   09/15/20 1230  vancomycin (VANCOREADY) IVPB 1250 mg/250 mL        1,250 mg 166.7 mL/hr over 90 Minutes Intravenous  Once 09/15/20 1131 09/15/20 1402   09/13/20 1400  ceFEPIme (MAXIPIME) 2 g in sodium chloride 0.9 % 100 mL IVPB        2 g 200 mL/hr over 30 Minutes Intravenous Every 8 hours 09/13/20 1341 09/19/20 2208   09/06/20 0600  vancomycin (VANCOREADY) IVPB 1250 mg/250 mL        1,250 mg 166.7 mL/hr over 90 Minutes Intravenous  Once 09/06/20 0541 09/06/20 0739   09/05/20 1400  ceFAZolin (ANCEF) IVPB 1 g/50 mL premix  Status:  Discontinued        1 g 100 mL/hr over 30 Minutes Intravenous Every 8 hours 09/05/20 1120 09/13/20 1341   08/30/20 0600  vancomycin (VANCOREADY) IVPB 500 mg/100 mL  Status:  Discontinued        500 mg 100 mL/hr over 60 Minutes Intravenous Every 12 hours 08/29/20 1554 09/01/20 1115   08/29/20 1700  ceFEPIme (MAXIPIME) 2 g in sodium chloride 0.9 % 100 mL IVPB  Status:  Discontinued        2 g 200 mL/hr over 30 Minutes Intravenous Every 12 hours 08/29/20 1554 09/01/20 1115   08/29/20 1645  vancomycin (VANCOREADY) IVPB 1250 mg/250 mL        1,250 mg 166.7 mL/hr over 90 Minutes Intravenous  Once 08/29/20 1554 08/29/20 1820       Time spent: 35 minutes    Erin Hearing ANP  [facility] 7 am - 330 pm/M-F for direct patient care and secure chat Please refer to Amion for contact info 41  days

## 2020-10-07 NOTE — TOC Progression Note (Signed)
Transition of Care [facility]) - Progression Note    Patient Details  Name: Charlene [name] MRN: [id number] Date of Birth: 1966/02/28  Transition of Care [facility]) CM/SW [place], RN Phone Number: 10/07/2020, 1:20 PM  Clinical Narrative:    Case management met with the patient at the bedside regarding transitions of care needs.  Erin Hearing, NP and [name], MSW were both present during assessment with the patient.  No family was present with the patient when we arrived and patient was quiet and resting in the bed at this time.  The patient was unable to track with eyes or follow commands while at the bedside.  The patient remains on trach collar at 28% and tube feeds through the PEG tube at this time.  CM and MSW are currently waiting on presentation of [place] paperwork at this time from the patient's daughter.    I spoke with Davy Pique, director of unit this morning and she reported that she has asked family multiple times for copy of the healthcare power of attorney and family was unable to give staff a copy of the document.  Communication follow up will continue with the family and primary contact and decision maker for the patient remains the patient's husband, until proper documentation is placed in the chart by the family.  Davy Pique, Soil scientist reports that the daughter has requested that Medicaid paperwork be mailed to [address] [place], [place] but until healthcare power of attorney for decision making is clarified - all decisions regarding patient's disposition and care plans for discharge planning will occur through the patient's husband, listed on the chart.  CM and MSW will continue to follow the patient for transition of care needs.  CM and   Expected Discharge Plan: [place] Barriers to Discharge: Continued Medical Work up  Expected Discharge Plan and Services Expected Discharge Plan: [place] In-house Referral: Clinical Social Work Discharge Planning Services: CM Consult   Living arrangements for the past 2 months: Single Family Home                                       Social Determinants of Health (SDOH) Interventions    Readmission Risk Interventions Readmission Risk Prevention Plan 09/29/2020  Transportation Screening Complete  PCP or Specialist Appt within 3-5 Days Complete  HRI or [place] Complete  Social Work Consult for [place] Planning/Counseling Complete  Palliative Care Screening Complete  Medication Review Press photographer) Complete  Some recent data might be hidden

## 2020-10-07 NOTE — Plan of Care (Signed)
  Problem: Education: Goal: Individualized Educational Video(s) Outcome: Progressing   Problem: Education: Goal: Knowledge of secondary prevention will improve Outcome: Progressing

## 2020-10-08 DIAGNOSIS — Z789 Other specified health status: Secondary | ICD-10-CM | POA: Insufficient documentation

## 2020-10-08 LAB — GLUCOSE, CAPILLARY
Glucose-Capillary: 143 mg/dL — ABNORMAL HIGH (ref 70–99)
Glucose-Capillary: 123 mg/dL — ABNORMAL HIGH (ref 70–99)
Glucose-Capillary: 170 mg/dL — ABNORMAL HIGH (ref 70–99)
Glucose-Capillary: 133 mg/dL — ABNORMAL HIGH (ref 70–99)
Glucose-Capillary: 105 mg/dL — ABNORMAL HIGH (ref 70–99)
Glucose-Capillary: 141 mg/dL — ABNORMAL HIGH (ref 70–99)

## 2020-10-08 MED ORDER — CIPROFLOXACIN HCL 250 MG PO TABS
500.0000 mg | ORAL_TABLET | Freq: Two times a day (BID) | ORAL | Status: DC
  Administered 2020-10-08 – 2020-10-14 (×13): 500 mg
  Filled 2020-10-08 (×15): qty 2

## 2020-10-08 NOTE — Plan of Care (Signed)
  Problem: Education: Goal: Knowledge of patient specific risk factors addressed and post discharge goals established will improve Outcome: Progressing   Problem: Education: Goal: Knowledge of secondary prevention will improve Outcome: Progressing   Problem: Education: Goal: Knowledge of disease or condition will improve Outcome: Progressing   

## 2020-10-08 NOTE — Progress Notes (Signed)
Notified team that daughter was here and was requesting paperwork so she could go to [facility] and take care of pt's affairs.  I was informed by NP Revonda Standard that they spoke to husband today and all Medicaid and Disability paperwork will go to him. He said he had not signed over POA to anyone in the family. If they wish to take the lead on this they need to contact him. Legally he must OK someone else to do this Daughter left for the day and did not come back but if she does, she needs to be informed that anything she needs will have to come from the husband.

## 2020-10-08 NOTE — TOC Progression Note (Signed)
Transition of Care [facility]) - Progression Note    Patient Details  Name: Charlene [name] MRN: [id number] Date of Birth: 11/05/1965  Transition of Care [facility]) CM/SW Contact  Janae Bridgeman, RN Phone Number: 10/08/2020, 2:15 PM  Clinical Narrative:    Case management spoke with the husband, Alison Murray, on the phone regarding transitions of care.  I spoke to the husband on the phone at length about care needs for the patient and the process of seeking out disposition for the patient in regards to SNF placement for rehab or home with home health if the patient's family is able to provide 24 hour care.    [name] is agreeable to explore rehab facilities in the meantime to offer choices to the family in regards to post-hospitalization care once the patient is medically able to do so.  Edwin Dada, MSW placed a FL2 in the hub at this point but were have not faxed the patient out to facilities until she is medically able to transfer a facility for rehab. The patient has not been vaccinated for COVID at this time.   [name] currently works during the day and is able to provide 24 hour care, but he states that he has has had positive family meetings with the patient's extended family in order to communicate proper future needs for the family.  I asked the patient's husband if the patient had a healthcare power of attorney and the husband was unaware of any paperwork involving the patient.  [name] is the lawful medical decision maker for this patient since no power of attorney paperwork is available at this time.  [name] states that he has not had problems visiting with the patient at the bedside and that although the patient's family has been under stress with her illness - he feels like family stressors are improved after the family has had a family meeting recently.  [name] states that he has not talked with financial  counselors at the hospital nor started disability application.  Email was sent to financial counselor to get Medicaid and disability application started and that the patient's husband will be the point of contact.  [name] is aware that the patient will need LTC Medicaid versus the family care planning Medicaid that she currently has for health coverage in the hospital.  Risk management will not be needed at this point.  CM and MSW will continue to follow the patient for transitions of care needs for SNF placement versus home with home health support.  Patient is currently not medically ready to transfer to either at this point, but will continue to follow the patient medically before faxing the patient out in the hub for rehab facilities.   Expected Discharge Plan: Skilled Nursing Facility Barriers to Discharge: Continued Medical Work up  Expected Discharge Plan and Services Expected Discharge Plan: Skilled Nursing Facility In-house Referral: Clinical Social Work Discharge Planning Services: CM Consult   Living arrangements for the past 2 months: Single Family Home                                       Social Determinants of Health (SDOH) Interventions    Readmission Risk Interventions Readmission Risk Prevention Plan 09/29/2020  Transportation Screening Complete  PCP or Specialist Appt within 3-5 Days Complete  HRI or Home Care Consult Complete  Social Work Consult for Recovery Care Planning/Counseling Complete  Palliative Care Screening Complete  Medication Review [employer]) Complete  Some recent data might be hidden

## 2020-10-08 NOTE — Progress Notes (Addendum)
[facility] PROGRESS NOTE  Charlene [name] JKD:[id number] DOB: [birth year] DOA: 08/26/2020 PCP: Patient, [name]    October 08, 2020   Status: Remains inpatient appropriate because:Altered mental status, Unsafe d/c plan, IV treatments appropriate due to intensity of illness or inability to take PO and Inpatient level of care appropriate due to severity of illness   Dispo: The patient is from: Home              Anticipated d/c is to: SNF-family does not wish to place patient in SNF.  If this is the only option they would like to consider taking the patient home although this may be a difficult undertaking given current requirement for trach and understandable fears about managing such complex patient in the home environment.  They have been updated as to why this patient currently is not a candidate for aggressive rehabilitative therapies like would be provided at a brain injury recovery rehab.  ** Awaiting clarification of POA status.  Patient is married and it is important that we clarify if someone other than the patient's husband (legal next of kin) has been assigned power of attorney.  This is important regarding potential legal issues involving this patient.              Patient currently is not medically stable to d/c.   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 Subsequently on 2/10, she developed obtundation and respiratory arrest leading to intubation. She has remained persistently obtunded, now has a trach and a PEG. Hospital course  complicated by persistent fever,Serratia pneumoniaand now pre-orbital cellulitis  2/09 Admit for ICH 2/10 EVD placed, later intubated due to obtundation and respiratory arrest 2/11 MRI Brain, hypertonic saline d/c'd, EVD not working. Husband did not consent to PICC  2/13 EVD removed 2/17 [place] discussion with family, PMT 2/24 Bedside trach and PEG 3/1 Transferred to [facility]  Subjective: Awake upon entry into the room.  Left eye open and noted that she was tracking and that she moved her eye from midline positioning to medial side so she can see me better.  Was unable to follow commands but did have some resistance with attempts to extend right arm but eventually let us perform this.  No family at bedside.  Objective: Vitals:   10/08/20 0318 10/08/20 0540  BP: 100/62 100/66  Pulse: 74 87  Resp: 17 17  Temp: 98.4 F (36.9 C)   SpO2: 100% 100%    Intake/Output Summary (Last 24 hours) at 10/08/2020 0755 Last data filed at 10/08/2020 0600 Gross per 24 hour  Intake 1420 ml  Output 1350 ml  Net 70 ml   Filed Weights   10/01/20 0500 10/07/20 0305 10/08/20 0318  Weight: 67.7 kg 62 kg 61.3 kg    Exam:  Constitutional: Awake and in no acute distress Respiratory: #6.0 cuffless trach, trach collar with FiO2 21%.  Lungs are clear to auscultation and no tracheal secretions noted; Nursing states pt has positive cough w. Tracheal suction Cardiovascular: Normal heart sounds.  No peripheral edema.  No further tachycardia. Abdomen:  PEG tube with continuous tube feeedings, LBM 3/21  Musculoskeletal: Torticollis with associated left neck deviation improving.  I was able to extend patient's right arm from flexion to almost full extension.  This maneuver did not precipitate spasticity.  Patient initially resisted somewhat but eventually allowed the maneuver to be completed.  Lower extremities stable in PROF boots. Neurologic: CN 2-12 grossly intact but exam limited by inability to fully participate.  Today able to partially open R eyelid (see above picture). Left hemiparesis Psychiatric: Awake but nonverbal   Assessment/Plan: Acute problems: Multiple acute strokes  Right Thalamus ICH with IVH   Obstructive Hydrocephalus/Acute on persistent metabolic Encephalopathy/brain injury related hypertonicity -CT 2/9 with acute hemorrhage with epicenter in the right thalamus. Intraventricular penetration with blood filling the 3rd and forth ventricles and nearly filling the right lateral ventricle.  -MRI 2/11 with intraparenchymal hemorrhage centered within the right thalamus and basal ganglia extending into the right midbrain with intraventricular extension. Effacement of prepontine cistern and similar 4 mm of leftward midline shift. Separate areas of ristricted diffusion and edema involving the right greater than left mesial temporal lobes / parahippocampal cortex, bilateral basal ganglia, and possibly the upper pons that are concerning for acute infarcts, most likely 2/2 mass effect from the hemorrhage (see report)/ Per neuro "scattered b/l hippocampus, posterior CC, bilateral thalamus, and upper pons, likely due to deep penetrating vessel compression by focal mass-effect".  -2/19 MRI with acute to early subacute infarcts in the bilateral cerebral white matter and right cerebellum, new from 2/11 - interval evolution of multiple other infarcts present on prior MRI, increased hemorrhage along old right EVD tract (see report) - 2/21 head CT with expected evolution of ICH since 2/13 - Echo with normal EF, no intracardiac source of embolism - CTA head/neck without intracranial arterial occlusion or high grade stenosis, no dissection, aneurysm, or hemodynamically significant stenosis of the carotid or vertebral arteries - Head CT 3/9 with expected evolution of hemorrhage involving the right thalamus, basal ganglia, and adjacent white matter from prior exam with developing encephalomalacia.  Resolved  previous intraventricular hemorrhage.  Persistent sulcal effacement with mild improvement.  Persistent 3 mm left to right midline shift.  Multiple areas of low attenuation consistent with infarcts within the central white matter, unchanged in distribution from prior.  (see report) - Per neurology- see last follow up note from 2/25 - [place] likely 2/2 hypertension and nonadherence to meds  - s/p trach/peg on 2/24  - no antithrombotic 2/2 ICH -Obstructive hydrocephalus -> s/p external ventricular catheter placement on 08/27/20 by neurosurgery - EVD clamped on 2/12 and d/c'd on 2/13 -EEG suggestive of cortical dysfunction in right hemisphere likely 2/2 underlying bleed as well as severe diffuse encephalopathy, nonspecific etiology . -3/21 increased baclofen 5 mg toTID.  Upper extremity hypertonicity seems to be improving during PROM.   AST 55 and ALT 59 with a normal total bilirubin last week and will repeat LFTs on Wednesday and Friday of this week. -3/22 EEG no seizures and persistent encephalopathy  Neck Contracture/torticollis -Preference for keeping head turned to the left. -See above regarding increase in baclofen dosage on 3/15 -- continue PT/OT  Hypertensive emergency/Malignant HTN Resolved -Home medications include Coreg and HCTZ which have not been reordered -Continue metoprolol, hydralazine  and amlodipine - follow, fluctuating - adjust prn  -Continue hydralazine and labetalol prn with SBP goal < 160 -suspect BP lability 2/2 pain  Recurrent Fever   Serratia Pneumonia   Hospital Acquired Pneumonia:  -Tracheal aspirate from 2/25 and 3/1 with serratia - cefepime 2/27-3/5 -There  is also concern for central fever, s/p buspirone - s/p treatment for periorbital cellulitis as noted below -TM 102 3/17-CXR unremarkable-tracheal aspirate 3/21 w/o WBCs but abundant GPCs, GNRs and GPCs on Gram stain  -Culture positive for rare Serratia marcescens.  Patient at this time asymptomatic without  hypoxemia or fever.  Sensitive to cipro so will go ahead and treat x 7 days for early tracheobronchitis in pt who is unable to cough effectively  Acute hypoxemic respiratory failure secondary to CVA requiring mechanical ventilation and tracheostomy tube -Stable on trach collar and has been transitioned to a #6 cuffless trach -Has significant altered mentation and inability to manage oral secretions at present time is not a safe candidate for decannulation.  Discussed this with family as major obstacle in discharging to home.  This will also decrease options for SNF placement. -Appreciate trach team assistance in management  Bacterial Conjunctivitis/periorbital cellulitis - MRI 3/7 orbits with L periorbital cellulitis  -- CT head 3/9 with persistent subtotal opacification of paranasal sinuses and mastoid air cells, bubbly lucency at base of R maxillary sinus (likely odontogenic in origin) - CT maxillofacial 3/12 - improving cellulitis  - s/p erythromycin ointment - completed course of Ancef   Pain likely related to underlying hypertonicity -Consider adding low-dose oxycodone short acting 2.5 mg every 12 hours.  Will first monitor for response to increase in baclofen on 3/21 -Morphine IV and Tylenol prn (watch elev LFTs) -Treat underlying hypertonicity with skeletal muscle relaxers and uptitrate as tolerated with goal to improve and resolve hypertonicity without contributing to oversedation  Dysphagia/Nutrition Status: Nutrition Problem: Inadequate oral intake Etiology: inability to eat Signs/Symptoms: NPO status Interventions: Tube feeding consisting of Osmolite 1.2 Cal at 55 cc/h; continue Prosource liquid 45 cc daily, free water 100 cc every 4 hours per tube; will also continue to follow CBGs regularly while on tube feedings and provide SSI Estimated body mass index is 22.49 kg/m as calculated from the following:   Height as of this encounter: '5\' 5"'  (1.651 m).   Weight as of this  encounter: 61.3 kg. If patient eventually discharged to home to teach family how to bolus feed as opposed to utilizing continuous tube feeding infusions.   Other problems: Anasarca -bilateral upper extremity edema, minimal LE edema - suspect related to prolonged hospitalization, hypoalbuminemia -upper extremity US-> age indeterminate SVT of right cephalic vein  Elevated LFT's - fluctuating, continue to monitor - follow - w/u further as indicated - continue statin for now  Leukocytosis -Resolved on antibiotics  Anemia -Stable, follow  Sinus Tachycardia Better on BB and after treatment of pain/hypertonicity Echo with EF 60-65% (see report)   Data Reviewed: Basic Metabolic Panel: Recent Labs  Lab 10/02/20 0733 10/03/20 0147 10/06/20 0357  NA 137 141 140  K 4.0 4.9 4.0  CL 100 103 101  CO2 '25 31 29  ' GLUCOSE 163* 106* 116*  BUN '20 20 18  ' CREATININE 0.83 0.72 0.71  CALCIUM 8.8* 9.4 9.7  MG  --  2.1  --   PHOS  --  4.6  --    Liver Function Tests: Recent Labs  Lab 10/02/20 0733 10/03/20 0147 10/06/20 0357  AST 78* 131* 70*  ALT 100* 157* 111*  ALKPHOS 96 117 112  BILITOT 0.6 0.4 0.6  PROT 6.8 7.6 7.6  ALBUMIN 2.4* 2.7* 2.7*   No results for input(s): LIPASE, AMYLASE in the last 168 hours. No results for input(s): AMMONIA in the last 168 hours. CBC: Recent Labs  Lab  10/02/20 0733 10/03/20 0147 10/05/20 0835  WBC 10.4 10.9* 7.9  HGB 8.9* 10.1* 9.0*  HCT 28.4* 32.0* 29.4*  MCV 82.6 82.5 82.4  PLT 345 396 377   Cardiac Enzymes: No results for input(s): CKTOTAL, CKMB, CKMBINDEX, TROPONINI in the last 168 hours. BNP (last 3 results) No results for input(s): BNP in the last 8760 hours.  ProBNP (last 3 results) No results for input(s): PROBNP in the last 8760 hours.  CBG: Recent Labs  Lab 10/07/20 1137 10/07/20 1531 10/07/20 2027 10/07/20 2357 10/08/20 0341  GLUCAP 103* 123* 138* 120* 143*    Recent Results (from the past 240 hour(s))   Culture, blood (Routine X 2) w Reflex to ID Panel     Status: None   Collection Time: 10/02/20  7:38 AM   Specimen: BLOOD  Result Value Ref Range Status   Specimen Description BLOOD RIGHT ANTECUBITAL  Final   Special Requests   Final    BOTTLES DRAWN AEROBIC AND ANAEROBIC Blood Culture results may not be optimal due to an inadequate volume of blood received in culture bottles   Culture   Final    NO GROWTH 5 DAYS Performed at [facility], [place] [address], [place], [place] [address]    Report Status 10/07/2020 FINAL  Final  Culture, blood (Routine X 2) w Reflex to ID Panel     Status: None   Collection Time: 10/02/20  7:42 AM   Specimen: BLOOD RIGHT HAND  Result Value Ref Range Status   Specimen Description BLOOD RIGHT HAND  Final   Special Requests   Final    BOTTLES DRAWN AEROBIC ONLY Blood Culture adequate volume   Culture   Final    NO GROWTH 5 DAYS Performed at [facility], [place] [address], [place], [place] [address]    Report Status 10/07/2020 FINAL  Final  Culture, Respiratory w Gram Stain     Status: None   Collection Time: 10/05/20  1:12 PM   Specimen: Tracheal Aspirate; Respiratory  Result Value Ref Range Status   Specimen Description TRACHEAL ASPIRATE  Final   Special Requests NONE  Final   Gram Stain   Final    NO WBC SEEN ABUNDANT GRAM POSITIVE COCCI ABUNDANT GRAM NEGATIVE RODS MODERATE GRAM POSITIVE RODS Performed at [facility], [place] [address], [place], [place] [address]    Culture RARE SERRATIA MARCESCENS  Final   Report Status 10/07/2020 FINAL  Final   Organism ID, Bacteria SERRATIA MARCESCENS  Final      Susceptibility   Serratia marcescens - MIC*    CEFAZOLIN >=64 RESISTANT Resistant     CEFEPIME <=0.12 SENSITIVE Sensitive     CEFTAZIDIME <=1 SENSITIVE Sensitive     CEFTRIAXONE <=0.25 SENSITIVE Sensitive     CIPROFLOXACIN <=0.25 SENSITIVE Sensitive     GENTAMICIN <=1 SENSITIVE Sensitive     TRIMETH/SULFA <=20 SENSITIVE Sensitive      * RARE SERRATIA MARCESCENS     Studies: EEG adult  Result Date: 11/02/2020 Raenette Rover, MD     02-Nov-2020  9:27 PM EEG Report Indication: possible seizure activity This study was recorded in the waking/drowsy state.  The duration of the study was 23 minutes.  Electrodes were placed according to the International 10/20 system.  Video was reviewed for clinical correlation as needed.  In the waking state, some degree of background organization is intermittently seen with a very attenuated but perceptible anterior - posterior voltage and frequency gradient.  In the occipital leads there is a symmetric and reactive posterior dominant rhythm of approximately 6-7 hertz, which is slower than expected for age, very poorly sustained and indistinct.  Anteriorly, is the expected pattern of faster frequency, lower voltage waveforms though in a significantly reduced amount as compared to normal, and with some delta and theta slowing. The gradient is slightly more diminished and the posterior dominant rhythm less distinct in the right hemisphere, and there is a region of increased slowing in the right central region.  During the drowsy state, there is attenuation of the gradient, a general shift to slower frequencies diffusely to the theta range/diminished alpha, a waxing and waning of the posterior dominant rhythm, and slow roving eye movements.  Hyperventilation: Deferred Photic stimulation: Unremarkable There are no clear paroxysmal or epileptiform abnormalities seen during this portion of the recording.  Impression:  This is an abnormal waking and drowsy study due to very attenuated organization as described above--clinically most consistent with a mild-moderate diffuse encephalopathy, as well as subtly diminished right hemispheric organization and increased slowing in the right central region suggestive of increased focal neuronal dysfunction.  There are no clear epileptiform abnormalities.    Scheduled  Meds:  amLODipine  5 mg Per Tube Daily   atorvastatin  40 mg Per Tube Daily   baclofen  5 mg Per Tube BID   chlorhexidine gluconate (MEDLINE KIT)  15 mL Mouth Rinse BID   Chlorhexidine Gluconate Cloth  6 each Topical Daily   ciprofloxacin  500 mg Oral BID   enoxaparin (LOVENOX) injection  40 mg Subcutaneous Q24H   feeding supplement (PROSource TF)  45 mL Per Tube Daily   free water  100 mL Per Tube Q4H   hydrALAZINE  50 mg Per Tube Q8H   insulin aspart  0-9 Units Subcutaneous Q4H   mouth rinse  15 mL Mouth Rinse 10 times per day   metoprolol tartrate  100 mg Per Tube BID   pantoprazole sodium  40 mg Per Tube BID   scopolamine  1 patch Transdermal Q72H   Continuous Infusions:  sodium chloride Stopped (09/28/20 1344)   feeding supplement (OSMOLITE 1.2 CAL) 1,000 mL (10/07/20 2233)    Principal Problem:   ICH (intracerebral hemorrhage) (HCC) Active Problems:   Intraparenchymal hemorrhage of brain (HCC)   Acute hypoxemic respiratory failure (HCC)   Fever   Endotracheal tube present   Status post tracheostomy ([place])   Palliative care by specialist   Muscle hypertonicity   Oropharyngeal dysphagia   Brain injury with loss of consciousness ([place])   Consultants:  Palliative care  Pulm  Neurology   Procedures:  Tracheostomy  PEG tube  EEG  Echocardiogram  Antibiotics: Anti-infectives (From admission, onward)   Start     Dose/Rate Route Frequency Ordered Stop   10/08/20 0845  ciprofloxacin (CIPRO) 500 MG/5ML (10%) suspension 500 mg        500 mg Oral 2 times daily 10/08/20 0754 10/15/20 0759   09/21/20 1545  ceFAZolin (ANCEF) IVPB 1 g/50 mL premix        1 g 100 mL/hr over 30 Minutes Intravenous Every 8 hours 09/21/20 1457 09/28/20 0647   09/20/20 0600  ceFAZolin (ANCEF) IVPB 1 g/50 mL premix  Status:  Discontinued        1 g 100 mL/hr over 30 Minutes Intravenous Every 8 hours 09/14/20 0903 09/14/20 0904   09/16/20 1300  vancomycin (VANCOREADY)  IVPB 1250 mg/250 mL  Status:  Discontinued  1,250 mg 166.7 mL/hr over 90 Minutes Intravenous Every 24 hours 09/15/20 1232 09/17/20 0956   09/15/20 1230  vancomycin (VANCOREADY) IVPB 1250 mg/250 mL        1,250 mg 166.7 mL/hr over 90 Minutes Intravenous  Once 09/15/20 1131 09/15/20 1402   09/13/20 1400  ceFEPIme (MAXIPIME) 2 g in sodium chloride 0.9 % 100 mL IVPB        2 g 200 mL/hr over 30 Minutes Intravenous Every 8 hours 09/13/20 1341 09/19/20 2208   09/06/20 0600  vancomycin (VANCOREADY) IVPB 1250 mg/250 mL        1,250 mg 166.7 mL/hr over 90 Minutes Intravenous  Once 09/06/20 0541 09/06/20 0739   09/05/20 1400  ceFAZolin (ANCEF) IVPB 1 g/50 mL premix  Status:  Discontinued        1 g 100 mL/hr over 30 Minutes Intravenous Every 8 hours 09/05/20 1120 09/13/20 1341   08/30/20 0600  vancomycin (VANCOREADY) IVPB 500 mg/100 mL  Status:  Discontinued        500 mg 100 mL/hr over 60 Minutes Intravenous Every 12 hours 08/29/20 1554 09/01/20 1115   08/29/20 1700  ceFEPIme (MAXIPIME) 2 g in sodium chloride 0.9 % 100 mL IVPB  Status:  Discontinued        2 g 200 mL/hr over 30 Minutes Intravenous Every 12 hours 08/29/20 1554 09/01/20 1115   08/29/20 1645  vancomycin (VANCOREADY) IVPB 1250 mg/250 mL        1,250 mg 166.7 mL/hr over 90 Minutes Intravenous  Once 08/29/20 1554 08/29/20 1820       Time spent: 35 minutes    Erin Hearing ANP  [facility] 7 am - 330 pm/M-F for direct patient care and secure chat Please refer to Amion for contact info 42  days

## 2020-10-08 NOTE — Plan of Care (Signed)

## 2020-10-08 NOTE — Progress Notes (Signed)
NAME:  Charlene [name], MRN:  [id number], DOB:  1965-11-08, LOS: 42 ADMISSION DATE:  08/26/2020, CONSULTATION DATE:  2/10 REFERRING MD:  Dr. Roda Shutters, CHIEF COMPLAINT:  ICH   Brief History:  55 year old female admitted with hypertensive R thalamic/basal ganglia ICH. Resultant obstructive hydrocephalus prompting EVD placement 2/10. Subsequently, 2/10 she had acute mental status change to obtundation and respiratory arrest resulting in intubation. Trach and Peg 2/24. Trach change to #6 cuffless on 3/11  Past Medical History:  HTN, medication noncompliance  Significant Hospital Events:  2/09 Admit for ICH 2/10 EVD placed, later intubated due to obtundation and respiratory arrest 2/11 MRI Brain, hypertonic saline d/c'd, EVD not working.  Husband did not consent to PICC  2/13 EVD removed 2/17 GOC discussion with family, PMT 3/11 trach changed to Shiley 6 uncuffed  Consults:  Stroke PCCM Neurosurgery  Procedures:  ETT 2/10 >> 09/10/2020 EVD 2/10 > non functioning 2/11 at 1500; clamped 2/12 am > 2/13 Trach and diagnostic Bronchoscopy 2/24>> Dr. Denese Killings PEG placement 2/24>> Dr. Sheliah Hatch  Significant Diagnostic Tests:  CT Head 2/9 >> Acute hemorrhage with the epicenter in the right thalamus. Measurement is approximately 3.2 x 2.1 x 2.5 cm (volume 8.8 cm^3). Intraventricular penetration with blood filling the third and fourth ventricles and nearly filling the right lateral ventricle. No hydrocephalus. CTA Head/Neck 2/10 >> Unchanged size of intraparenchymal hemorrhage centered in the right thalamus and basal ganglia with extension into the right lateral ventricle. 2. No intracranial arterial occlusion or high-grade stenosis. No dissection, aneurysm or hemodynamically significant stenosis of the carotid or vertebral arteries. CT Head 2/10 >> Stable parenchymal hemorrhage in the right thalamus and basal ganglia with intraventricular extension as described. No new focal abnormality is noted. CT Head  2/10 (post intubation) >> redemonstrated intraparenchymal hemorrhage centered within the R thalamus/basal ganglia extending into the cerebral peduncle/midbrain with associated intraventricular extension and hemorrhage in the R lateral ventricle and fourth ventricle, overall not significantly changed from prior; stable mass effect, 56mm of R to L midline shift, postsurgical changes of ventriculostomy TTE 2/10 >> LVEF 60 to 65%, LV has normal function, no regional wall motion abnormalities, mild concentric LVH.  RV systolic function is normal, normal pulmonary artery systolic pressure.  MRA head 2/11 >>Negative intracranial MR angiography. No evidence of aneurysm or high flow vascular malformation to explain the intraparenchymal hemorrhage  MRI brain 2/11 >> Similar intraparenchymal hemorrhage centered within the right thalamus and basal ganglia extending into the right midbrain with intraventricular extension, effacement of the prepontine cistern and similar 4 mm of leftward midline shift; separate areas of restricted diffusion and edema involving the R > L mesial temporal lobes/parahippocampal cortex, bilateral basal ganglia, and possibly the upper pons that are c/f acute infarcts, most likely secondary to mass effect from hemorrhage. MRI brain 2/19 >> intraventricular clot has decreased in size but mid-brain parenchymal clot has not and there are more ischemic infarcts in the surrounding brain.  CT Head WO Contrast 09/07/20>> Expected evolution of intracranial hemorrhage since 08/30/2020 head CT. Multiple areas of low attenuation corresponding to some of the infarcts seen on prior MRI. Ventricle caliber similar to prior MRI   Micro Data:  2/9 COVID + Influenza negative 2/10 MRSA PCR negative 2/12 BCx 2 > no growth  2/19 BC 1/2 +Stap EPI, 1/2 no growth   Antimicrobials:  Cefepime 2/12 > 2/15 Vanc 2/12 > 2/15  Interim History / Subjective:  Tolerating trach collar, altered mental status precludes  decannulation at this time.  Objective   Blood pressure 113/67, pulse 82, temperature 98.2 F (36.8 C), temperature source Axillary, resp. rate 17, height 5\' 5"  (1.651 m), weight 61.3 kg, last menstrual period 03/03/2014, SpO2 100 %, unknown if currently breastfeeding.    FiO2 (%):  [21 %-28 %] 28 %   Intake/Output Summary (Last 24 hours) at 10/08/2020 0839 Last data filed at 10/08/2020 0600 Gross per 24 hour  Intake 1420 ml  Output 1350 ml  Net 70 ml   Filed Weights   10/01/20 0500 10/07/20 0305 10/08/20 0318  Weight: 67.7 kg 62 kg 61.3 kg   General: Middle-age female no acute distress at rest HEENT: #6 uncuffed Shiley trach in place without drainage at this time. Neuro: Does not follow commands.  Right gaze preference.  Moves all extremities montane CV: Sounds are regular PULM: Diminished breath sounds throughout GI: soft, bsx4 active, PEG is in place Extremities: warm/dry,  edema  Skin: no rashes or lesions    Resolved Hospital Problem list   Hypernatremia  Obstructive hydrocephalus (resolved) Hypertensive emergency Acute hypoxemic respiratory failure secondary to CVA Serratia PNA  Bacterial Conjunctivitis   Assessment & Plan:   10/10/20 dependent s/p Multiple acute strokes c/b Intraparenchymal hemorrhage with right thalamic hemorrhage with extension into the third fourth and right lateral ventricle Dysphagia s/p PEG Anemia    Active Pulm problem list  Trach dependent s/p Multiple acute strokes c/b Intraparenchymal hemorrhage with right thalamic hemorrhage with extension into the third fourth and right lateral ventricle  Plan: .   Continue #6 uncuffed Shiley trach with inability to protect airway secondary to altered mental status from intracerebral hemorrhage  Pulmonary critical care will follow twice a week for trach  [name] Minor ACNP Acute Care Nurse Practitioner [facility] Pulmonary/Critical Care Please consult Amion 10/08/2020, 8:39 AM

## 2020-10-08 NOTE — Progress Notes (Signed)
CSW received email from [place] with financial counseling stating this patient only has a pending Family Planning Medicaid application - CSW advised Joaine to contact the patient's husband [name] directly to complete LTC application.  Edwin Dada, MSW, LCSW Transitions of Care  Clinical Social Worker II [phone]

## 2020-10-08 NOTE — NC FL2 (Cosign Needed Addendum)
[place] LEVEL OF CARE SCREENING TOOL     IDENTIFICATION  Patient Name: Charlene [name] Birthdate: 1965-12-01 Sex: female Admission Date (Current Location): 08/26/2020  [facility] and [place] Number:  Herbalist and Address:  [facility], [place] [address] [place], [place] [address]      Provider Number: [id number]  Attending Physician Name and Address:  Charlynne Cousins, MD  Relative Name and Phone Number:       Current Level of Care: Hospital Recommended Level of Care: [place] Prior Approval Number:    Date Approved/Denied:   PASRR Number: [id number]  Discharge Plan: SNF    Current Diagnoses: Patient Active Problem List   Diagnosis Date Noted  . Poor prognosis 10/08/2020  . Muscle hypertonicity   . Oropharyngeal dysphagia   . Brain injury with loss of consciousness ([place])   . Palliative care by specialist   . Status post tracheostomy ([place])   . Endotracheal tube present   . Fever   . ICH (intracerebral hemorrhage) ([place]) 08/27/2020  . Intraparenchymal hemorrhage of brain ([place])   . Acute hypoxemic respiratory failure ([place])   . Anxiety state, unspecified 03/11/2014  . Essential hypertension 03/11/2014  . Essential hypertension, benign 02/28/2014  . Accelerated hypertension 02/21/2014  . Nausea & vomiting 02/21/2014  . Headache 02/21/2014  . Chest pain 02/21/2014    Orientation RESPIRATION BLADDER Height & Weight      (Patient unable to communicate due to brain injury)  Tracheostomy (Trach collar @ 28%) Incontinent,External catheter Weight: 135 lb 2.3 oz (61.3 kg) Height:  _0  (165.1 cm)  BEHAVIORAL SYMPTOMS/MOOD NEUROLOGICAL BOWEL NUTRITION STATUS      Incontinent Feeding tube  AMBULATORY STATUS COMMUNICATION OF NEEDS Skin   Total Care Does not communicate Normal                       Personal Care Assistance Level of Assistance  Total care,Bathing,Feeding,Dressing Bathing  Assistance: Maximum assistance Feeding assistance: Maximum assistance Dressing Assistance: Maximum assistance Total Care Assistance: Maximum assistance   Functional Limitations Info  Sight,Hearing,Speech Sight Info: Adequate Hearing Info: Adequate Speech Info: Impaired    SPECIAL CARE FACTORS FREQUENCY  PT (By licensed PT),OT (By licensed OT)     PT Frequency: 5x weekly OT Frequency: 5x weekly            Contractures Contractures Info: Not present (Muscle spasticity) 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 0.9 %  sodium chloride infusion   Intravenous PRN Sherrilyn Rist A, MD   Stopped at 09/28/20 1344  . acetaminophen (TYLENOL) tablet 650 mg  650 mg Oral Q4H PRN Amie Portland, MD   650 mg at 10/07/20 2203   Or  . acetaminophen (TYLENOL) 160 MG/5ML solution 650 mg  650 mg Per Tube Q4H PRN Amie Portland, MD   650 mg at 10/06/20 2103   Or  . acetaminophen (TYLENOL) suppository 650 mg  650 mg Rectal Q4H PRN Amie Portland, MD   650 mg at 08/27/20 1238  . amLODipine (NORVASC) tablet 5 mg  5 mg Per Tube Daily Pierce, Dwayne A, RPH   5 mg at  10/08/20 1019  . atorvastatin (LIPITOR) tablet 40 mg  40 mg Per Tube Daily Elodia Florence., MD   40 mg at 10/08/20 1019  . baclofen (LIORESAL) tablet 5 mg  5 mg Per Tube BID Samella Parr, NP   5 mg at 10/08/20 1017  . chlorhexidine gluconate (MEDLINE KIT) (PERIDEX) 0.12 % solution 15 mL  15 mL Mouth Rinse BID Rosalin Hawking, MD   15 mL at 10/08/20 0848  . Chlorhexidine Gluconate Cloth 2 % PADS 6 each  6 each Topical Daily Amie Portland, MD   6 each at 10/08/20 [phone]  . ciprofloxacin (CIPRO) tablet 500 mg  500 mg Per Tube BID Samella Parr, NP   500 mg at 10/08/20 1027  . enoxaparin (LOVENOX) injection 40 mg  40 mg Subcutaneous Q24H Bertis Ruddy, RPH   40 mg at 10/08/20 1021  . feeding supplement (OSMOLITE 1.2 CAL) liquid 1,000 mL  1,000 mL Per Tube Continuous Garvin Fila, MD 55 mL/hr at 10/07/20 2233 1,000 mL at 10/07/20 2233  . feeding supplement (PROSource TF) liquid 45 mL  45 mL Per Tube Daily Garvin Fila, MD   45 mL at 10/08/20 1021  . free water 100 mL  100 mL Per Tube Q4H Rosalin Hawking, MD   100 mL at 10/08/20 1200  . hydrALAZINE (APRESOLINE) injection 5 mg  5 mg Intravenous Q4H PRN Shalhoub, Sherryll Burger, MD      . hydrALAZINE (APRESOLINE) tablet 50 mg  50 mg Per Tube Q8H Elodia Florence., MD   50 mg at 10/08/20 0540  . insulin aspart (novoLOG) injection 0-9 Units  0-9 Units Subcutaneous Q4H Jennelle Human B, NP   1 Units at 10/08/20 1302  . labetalol (NORMODYNE) injection 10 mg  10 mg Intravenous Q4H PRN Merlene Laughter F, NP   10 mg at 10/06/20 2243  . MEDLINE mouth rinse  15 mL Mouth Rinse 10 times per day Elodia Florence., MD   15 mL at 10/08/20 1200  . metoprolol tartrate (LOPRESSOR) 25 mg/10 mL oral suspension 100 mg  100 mg Per Tube BID Elodia Florence., MD   100 mg at 10/08/20 1022  . morphine 2 MG/ML injection 1 mg  1 mg Intravenous Q6H PRN Elodia Florence., MD   1 mg at 10/04/20 0848  . oxyCODONE (Oxy IR/ROXICODONE) immediate release tablet 5 mg  5 mg Per Tube Q4H PRN Merlene Laughter F, NP   5 mg at 10/06/20 0349  . pantoprazole sodium (PROTONIX) 40 mg/20 mL oral suspension 40 mg  40 mg Per Tube BID Jennelle Human B, NP   40 mg at 10/08/20 1020  . polyethylene glycol (MIRALAX / GLYCOLAX) packet 17 g  17 g Per Tube Daily PRN Agarwala, Einar Grad, MD      . scopolamine (TRANSDERM-SCOP) 1 MG/3DAYS 1.5 mg  1 patch Transdermal Q72H Spero Geralds, MD   1.5 mg at 10/07/20 1020  . senna-docusate (Senokot-S) tablet 1 tablet  1 tablet Per Tube BID PRN Kipp Brood, MD   1 tablet at 09/28/20  [id number]     Discharge Medications: Please see discharge summary for a list of discharge medications.  Relevant Imaging Results:  Relevant Lab Results:   Additional Information SSN: [id number]  Archie Endo, LCSW

## 2020-10-08 NOTE — TOC Progression Note (Signed)
Transition of Care [facility]) - Progression Note    Patient Details  Name: CHELSEA PEDRETTI MRN: [id number] Date of Birth: 12-Jan-1966  Transition of Care [facility]) CM/SW [place], RN Phone Number: 10/08/2020, 11:55 AM  Clinical Narrative:    CM and NP met with the patient at the bedside in regards to transitions of care.  The patient remains on trach collar on 28% humidified O2.  The patient was resting and unable to follow commands.  No family is present in the room this morning during assessment.  The patient's chart did not include a copy of the Healthcare Power of Attorney and family is aware that document is needed for support of the patient's / family wishes.  At this point, the patient's husband is the decision maker and primary contact for updates and decision making for the patient.  CM and NP spoke with Davy Pique, department leadership and she will follow up with risk management to facilitate visitation for the patient's family  CM and MSW will continue to follow the patient for transitions of care needs for the patient.   Expected Discharge Plan: Skilled Nursing Facility Barriers to Discharge: Continued Medical Work up  Expected Discharge Plan and Services Expected Discharge Plan: [place] In-house Referral: Clinical Social Work Discharge Planning Services: CM Consult   Living arrangements for the past 2 months: Single Family Home                                       Social Determinants of Health (SDOH) Interventions    Readmission Risk Interventions Readmission Risk Prevention Plan 09/29/2020  Transportation Screening Complete  PCP or Specialist Appt within 3-5 Days Complete  HRI or [place] Complete  Social Work Consult for [place] Planning/Counseling Complete  Palliative Care Screening Complete  Medication Review Press photographer) Complete  Some recent data might be hidden

## 2020-10-09 LAB — GLUCOSE, CAPILLARY
Glucose-Capillary: 109 mg/dL — ABNORMAL HIGH (ref 70–99)
Glucose-Capillary: 113 mg/dL — ABNORMAL HIGH (ref 70–99)
Glucose-Capillary: 117 mg/dL — ABNORMAL HIGH (ref 70–99)
Glucose-Capillary: 130 mg/dL — ABNORMAL HIGH (ref 70–99)
Glucose-Capillary: 133 mg/dL — ABNORMAL HIGH (ref 70–99)
Glucose-Capillary: 150 mg/dL — ABNORMAL HIGH (ref 70–99)
Glucose-Capillary: 92 mg/dL (ref 70–99)

## 2020-10-09 MED ORDER — MODAFINIL 100 MG PO TABS: 100.0000 mg | ORAL_TABLET | Freq: Every day | ORAL | Status: DC

## 2020-10-09 MED ORDER — MODAFINIL 100 MG PO TABS
100.0000 mg | ORAL_TABLET | Freq: Every day | ORAL | Status: DC
  Administered 2020-10-10 – 2020-10-16 (×7): 100 mg
  Filled 2020-10-09 (×7): qty 1

## 2020-10-09 MED ORDER — BACLOFEN 10 MG PO TABS
5.0000 mg | ORAL_TABLET | Freq: Three times a day (TID) | ORAL | Status: DC
  Administered 2020-10-09 – 2020-10-13 (×13): 5 mg
  Filled 2020-10-09 (×13): qty 1

## 2020-10-09 NOTE — Progress Notes (Addendum)
12:15pm: CSW received voicemail from patient's daughter [name], CSW attempted to return call no answer so CSW left voicemail with answers to her questions.  10:30am: CSW received request to have competency letter completed to support long term Medicaid application from [employer].   CSW reviewed letter - noticed the address listed was not the patient's home address. The address listed was [address], [place], [place] [address] - the same address given to nursing staff by daughter [name] yesterday.  CSW spoke with patient's husband [name] to obtain permission to use the [place] address for the [facility] and Disability applications - [name] gave permission to use the address.  CSW assisted NP in finalizing letter and returned it to [place] for submission.  Edwin Dada, MSW, LCSW Transitions of Care  Clinical Social Worker II [phone]

## 2020-10-09 NOTE — Progress Notes (Addendum)
[facility] PROGRESS NOTE  HARJIT DOUDS OZH:[id number] DOB: 1966-03-18 DOA: 08/26/2020 PCP: Patient, [name]    October 08, 2020   Status: Remains inpatient appropriate because:Altered mental status, Unsafe d/c plan, IV treatments appropriate due to intensity of illness or inability to take PO and Inpatient level of care appropriate due to severity of illness   Dispo: The patient is from: Home              Anticipated d/c is to: SNF-family does not wish to place patient in SNF.  If this is the only option they would like to consider taking the patient home although this may be a difficult undertaking given current requirement for trach and understandable fears about managing such complex patient in the home environment.  They have been updated as to why this patient currently is not a candidate for aggressive rehabilitative therapies like would be provided at a brain injury recovery rehab.  **3/24 Husband clarified that he has not delegated POA status to other family members.  **3/25 LCSW called patient's husband at home.  He confirmed that he is okay with daughter managing the Medicaid and disability process and has agreed to change the address to the one requested by the daughter.  Also a letter was requested by daughter to clarify this process and this has been placed in the chart.              Patient currently is not medically stable to d/c.   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 Subsequently on 2/10, she  developed obtundation and respiratory arrest leading to intubation. She has remained persistently obtunded, now has a trach and a PEG. Hospital course complicated by persistent fever,Serratia pneumoniaand now pre-orbital cellulitis  As of 3/25 with adjustments and hypertonicity medications patient seems to be improving.  She is also waking up more although limited interaction but both eyes are now open and she tracks with the left eye.  Multiple discussions with husband who has confirmed he has not delegated other family members to be POA and he is the primary legal contact for patient.  He has agreed to let patient's daughter take the lead on completing and following up on Medicaid and disability applications including having these applications sent to her address.  2/09 Admit for [place] 2/10 EVD placed, later intubated due to obtundation and respiratory arrest 2/11 MRI Brain, hypertonic saline d/c'd, EVD not working. Husband did not consent to PICC  2/13 EVD removed 2/17 [place] discussion with family, PMT 2/24 Bedside trach and PEG 3/1 Transferred to [facility]  Subjective: Much more alert today.  Both eyes open.  Tracking of left eye.  Unable to follow simple commands.  Does have some purposeful resistance of upper extremities with PROM.  Decreased hypertonicity in upper extremities.  Objective: Vitals:   10/09/20 0541 10/09/20 0737  BP:  (!) 170/94  Pulse: 88 (!) 104  Resp: 14 (!) 22  Temp:  99.5 F (37.5 C)  SpO2: 100% 100%    Intake/Output Summary (Last 24 hours) at 10/09/2020 0816 Last data filed at 10/09/2020  0600 Gross per 24 hour  Intake 605 ml  Output 1600 ml  Net -995 ml   Filed Weights   10/07/20 0305 10/08/20 0318 10/09/20 0500  Weight: 62 kg 61.3 kg 63.2 kg    Exam:  Constitutional: Awake, not interactive but does track with left eye.  No acute distress Respiratory: #6.0 cuffless trach, trach collar with FiO2 21%.  Lungs are clear to auscultation and no tracheal  secretions noted; Nursing states pt has positive cough w/ Tracheal suction Cardiovascular: Heart sounds are normal, essentially no tachycardia, skin warm and dry.  Adequate capillary refill Abdomen:  PEG tube for continuous tube feedings.  Soft nontender with normoactive bowel sounds.  LBM 3/24  Musculoskeletal: Torticollis with associated left neck deviation improving-neck pillow in place and family working regularly on neck PROM.  I was able to extend patient's right arm from flexion to almost full extension with hypertonicity-R>L UE hypertonicity.  No associated spasticity noted.  Patient initially resisted somewhat but eventually allowed the maneuver to be completed.  Lower extremities stable in PROF boots. Neurologic: CN 2-12 grossly intact but exam limited by inability to fully participate. Today able to partially open R eyelid (see above picture).  Psychiatric: Eyes are open and she appears to be awake and is tracking of the left.  Minimal interaction and is nonverbal so unable to gauge orientation accurately.   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 Intraventricular penetration with blood filling the 3rd and forth ventricles and nearly filling the right lateral ventricle.  -MRI 2/11 with intraparenchymal hemorrhage centered within the right thalamus and basal ganglia extending into the right midbrain with intraventricular extension. Effacement of prepontine cistern and similar 4 mm of leftward midline shift. Separate areas of ristricted diffusion and edema involving the right greater than left mesial temporal lobes / parahippocampal cortex, bilateral basal ganglia, and possibly the upper pons that are concerning for acute infarcts, most likely 2/2 mass effect from the  hemorrhage (see report)/ Per neuro "scattered b/l hippocampus, posterior CC, bilateral thalamus, and upper pons, likely due to deep penetrating vessel compression by focal mass-effect".  -2/19 MRI with acute to early subacute infarcts in the bilateral cerebral white matter and right cerebellum, new from 2/11 - interval evolution of multiple other infarcts present on prior MRI, increased hemorrhage along old right EVD tract (see report) - 2/21 head CT with expected evolution of ICH since 2/13 - Echo with normal EF, no intracardiac source of embolism - CTA head/neck without intracranial arterial occlusion or high grade stenosis, no dissection, aneurysm, or hemodynamically significant stenosis of the carotid or vertebral arteries - Head CT 3/9 with expected evolution of hemorrhage involving the right thalamus, basal ganglia, and adjacent white matter from prior exam with developing encephalomalacia.  Resolved previous intraventricular hemorrhage.  Persistent sulcal effacement with mild improvement.  Persistent 3 mm left to right midline shift.  Multiple areas of low attenuation consistent with infarcts within the central white matter, unchanged in distribution from prior.  (see report) - Per neurology- see last follow up note from 2/25 - [place] likely 2/2 hypertension and nonadherence to meds  - s/p trach/peg on 2/24  - no antithrombotic 2/2 ICH -Obstructive hydrocephalus -> s/p external ventricular catheter placement on 08/27/20 by neurosurgery - EVD clamped on 2/12 and d/c'd on 2/13 -EEG suggestive of cortical dysfunction in right hemisphere  likely 2/2 underlying bleed as well as severe diffuse encephalopathy, nonspecific etiology . -3/25 increased baclofen 5 mg to TID-dose was not increased to TID on 3/21 as previously documented.  Upper extremity hypertonicity seems to be improving during PROM.   AST 55 and ALT 59 with a normal total bilirubin last week and will repeat LFTs on Wednesday and Friday of this  week. -3/22 EEG no seizures and persistent encephalopathy -Prognosis remains guarded and it is highly doubtful she will return to previous level of functioning therefore financial counseling notified to pursue disability application -6/37 tachycardia has resolved.  Spasticity controlled with skeletal muscle extras with plan to uptitrate if necessary.  Given that we will attempt Provigil 100 mg daily and uptitrate if necessary to improve current wakefulness.  Typically this only works early in course of hospitalization since patient is just now having improved wakefulness will see if this can improve situation.  Neck Contracture/torticollis -Preference for keeping head turned to the left. -See above regarding increase in baclofen dosage on 3/15 -- continue PT/OT  Hypertensive emergency/Malignant HTN Resolved -Home medications include Coreg and HCTZ which have not been reordered -Continue metoprolol, hydralazine  and amlodipine - follow, fluctuating - adjust prn  -Continue hydralazine and labetalol prn with SBP goal < 160 -suspect BP lability 2/2 pain  Recurrent Fever  Serratia Pneumonia  Hospital Acquired Pneumonia:  -Tracheal aspirate from 2/25 and 3/1 with serratia - cefepime 2/27-3/5 -There is also concern for central fever, s/p buspirone - s/p treatment for periorbital cellulitis as noted below -TM 102 3/17-CXR unremarkable-tracheal aspirate 3/21 w/o WBCs but abundant GPCs, GNRs and GPCs on Gram stain  -Culture positive for rare Serratia marcescens.  Patient at this time asymptomatic without hypoxemia or fever.  Sensitive to cipro so will go ahead and treat x 7 days for early tracheobronchitis in pt who is unable to cough effectively  Acute hypoxemic respiratory failure secondary to CVA requiring mechanical ventilation and tracheostomy tube -Stable on trach collar and has been transitioned to a #6 cuffless trach -Has significant altered mentation and inability to manage oral  secretions at present time is not a safe candidate for decannulation.  Discussed this with family as major obstacle in discharging to home.  This will also decrease options for SNF placement. -Appreciate trach team assistance in management  Bacterial Conjunctivitis/periorbital cellulitis - MRI 3/7 orbits with L periorbital cellulitis  -- CT head 3/9 with persistent subtotal opacification of paranasal sinuses and mastoid air cells, bubbly lucency at base of R maxillary sinus (likely odontogenic in origin) - CT maxillofacial 3/12 - improving cellulitis  - s/p erythromycin ointment - completed course of Ancef   Pain likely related to underlying hypertonicity -Consider adding low-dose oxycodone short acting 2.5 mg every 12 hours.  Will first monitor for response to increase in baclofen on 3/21 -Morphine IV and Tylenol prn (watch elev LFTs) -Treat underlying hypertonicity with skeletal muscle relaxers and uptitrate as tolerated with goal to improve and resolve hypertonicity without contributing to oversedation  Dysphagia/Nutrition Status: Nutrition Problem: Inadequate oral intake Etiology: inability to eat Signs/Symptoms: NPO status Interventions: Tube feeding consisting of Osmolite 1.2 Cal at 55 cc/h; continue Prosource liquid 45 cc daily, free water 100 cc every 4 hours per tube; will also continue to follow CBGs regularly while on tube feedings and provide SSI Estimated body mass index is 23.19 kg/m as calculated from the following:   Height as of this encounter: _0  (1.651 m).   Weight as of this encounter:  63.2 kg. If patient eventually discharged to home to teach family how to bolus feed as opposed to utilizing continuous tube feeding infusions.   Other problems: Anasarca -bilateral upper extremity edema, minimal LE edema - suspect related to prolonged hospitalization, hypoalbuminemia -upper extremity US-> age indeterminate SVT of right cephalic vein  Elevated LFT's -  fluctuating, continue to monitor - follow - w/u further as indicated - continue statin for now  Leukocytosis -Resolved on antibiotics  Anemia -Stable, follow  Sinus Tachycardia Better on BB and after treatment of pain/hypertonicity Echo with EF 60-65% (see report)   Data Reviewed: Basic Metabolic Panel: Recent Labs  Lab 10/03/20 0147 10/06/20 0357  NA 141 140  K 4.9 4.0  CL 103 101  CO2 31 29  GLUCOSE 106* 116*  BUN 20 18  CREATININE 0.72 0.71  CALCIUM 9.4 9.7  MG 2.1  --   PHOS 4.6  --    Liver Function Tests: Recent Labs  Lab 10/03/20 0147 10/06/20 0357  AST 131* 70*  ALT 157* 111*  ALKPHOS 117 112  BILITOT 0.4 0.6  PROT 7.6 7.6  ALBUMIN 2.7* 2.7*   No results for input(s): LIPASE, AMYLASE in the last 168 hours. No results for input(s): AMMONIA in the last 168 hours. CBC: Recent Labs  Lab 10/03/20 0147 10/05/20 0835  WBC 10.9* 7.9  HGB 10.1* 9.0*  HCT 32.0* 29.4*  MCV 82.5 82.4  PLT 396 377   Cardiac Enzymes: No results for input(s): CKTOTAL, CKMB, CKMBINDEX, TROPONINI in the last 168 hours. BNP (last 3 results) No results for input(s): BNP in the last 8760 hours.  ProBNP (last 3 results) No results for input(s): PROBNP in the last 8760 hours.  CBG: Recent Labs  Lab 10/08/20 1547 10/08/20 2007 10/09/20 0012 10/09/20 0340 10/09/20 0747  GLUCAP 105* 141* 133* 92 117*    Recent Results (from the past 240 hour(s))  Culture, blood (Routine X 2) w Reflex to ID Panel     Status: None   Collection Time: 10/02/20  7:38 AM   Specimen: BLOOD  Result Value Ref Range Status   Specimen Description BLOOD RIGHT ANTECUBITAL  Final   Special Requests   Final    BOTTLES DRAWN AEROBIC AND ANAEROBIC Blood Culture results may not be optimal due to an inadequate volume of blood received in culture bottles   Culture   Final    NO GROWTH 5 DAYS Performed at [facility] [address], [place], [place] [address]    Report Status 10/07/2020 FINAL   Final  Culture, blood (Routine X 2) w Reflex to ID Panel     Status: None   Collection Time: 10/02/20  7:42 AM   Specimen: BLOOD RIGHT HAND  Result Value Ref Range Status   Specimen Description BLOOD RIGHT HAND  Final   Special Requests   Final    BOTTLES DRAWN AEROBIC ONLY Blood Culture adequate volume   Culture   Final    NO GROWTH 5 DAYS Performed at [facility], [place] [address], [place], [place] [address]    Report Status 10/07/2020 FINAL  Final  Culture, Respiratory w Gram Stain     Status: None   Collection Time: 10/05/20  1:12 PM   Specimen: Tracheal Aspirate; Respiratory  Result Value Ref Range Status   Specimen Description TRACHEAL ASPIRATE  Final   Special Requests NONE  Final   Gram Stain   Final    NO WBC SEEN ABUNDANT GRAM  POSITIVE COCCI ABUNDANT GRAM NEGATIVE RODS MODERATE GRAM POSITIVE RODS Performed at [facility], [place] [address], [place], [place] [address]    Culture RARE SERRATIA MARCESCENS  Final   Report Status 10/07/2020 FINAL  Final   Organism ID, Bacteria SERRATIA MARCESCENS  Final      Susceptibility   Serratia marcescens - MIC*    CEFAZOLIN >=64 RESISTANT Resistant     CEFEPIME <=0.12 SENSITIVE Sensitive     CEFTAZIDIME <=1 SENSITIVE Sensitive     CEFTRIAXONE <=0.25 SENSITIVE Sensitive     CIPROFLOXACIN <=0.25 SENSITIVE Sensitive     GENTAMICIN <=1 SENSITIVE Sensitive     TRIMETH/SULFA <=20 SENSITIVE Sensitive     * RARE SERRATIA MARCESCENS     Studies: No results found.  Scheduled Meds: . amLODipine  5 mg Per Tube Daily  . atorvastatin  40 mg Per Tube Daily  . baclofen  5 mg Per Tube BID  . chlorhexidine gluconate (MEDLINE KIT)  15 mL Mouth Rinse BID  . Chlorhexidine Gluconate Cloth  6 each Topical Daily  . ciprofloxacin  500 mg Per Tube BID  . enoxaparin (LOVENOX) injection  40 mg Subcutaneous Q24H  . feeding supplement (PROSource TF)  45 mL Per Tube Daily  . free water  100 mL Per Tube Q4H  . hydrALAZINE  50 mg Per Tube  Q8H  . insulin aspart  0-9 Units Subcutaneous Q4H  . mouth rinse  15 mL Mouth Rinse 10 times per day  . metoprolol tartrate  100 mg Per Tube BID  . pantoprazole sodium  40 mg Per Tube BID  . scopolamine  1 patch Transdermal Q72H   Continuous Infusions: . sodium chloride Stopped (09/28/20 1344)  . feeding supplement (OSMOLITE 1.2 CAL) 55 mL/hr at 10/09/20 0600    Principal Problem:   ICH (intracerebral hemorrhage) (HCC) Active Problems:   Intraparenchymal hemorrhage of brain (HCC)   Acute hypoxemic respiratory failure (HCC)   Fever   Endotracheal tube present   Status post tracheostomy ([place])   Palliative care by specialist   Muscle hypertonicity   Oropharyngeal dysphagia   Brain injury with loss of consciousness ([place])   Poor prognosis   Consultants:  Palliative care  Pulm  Neurology   Procedures:  Tracheostomy  PEG tube  EEG  Echocardiogram  Antibiotics: Anti-infectives (From admission, onward)   Start     Dose/Rate Route Frequency Ordered Stop   10/08/20 0845  ciprofloxacin (CIPRO) tablet 500 mg        500 mg Per Tube 2 times daily 10/08/20 0754 10/15/20 0759   09/21/20 1545  ceFAZolin (ANCEF) IVPB 1 g/50 mL premix        1 g 100 mL/hr over 30 Minutes Intravenous Every 8 hours 09/21/20 1457 09/28/20 0647   09/20/20 0600  ceFAZolin (ANCEF) IVPB 1 g/50 mL premix  Status:  Discontinued        1 g 100 mL/hr over 30 Minutes Intravenous Every 8 hours 09/14/20 0903 09/14/20 0904   09/16/20 1300  vancomycin (VANCOREADY) IVPB 1250 mg/250 mL  Status:  Discontinued        1,250 mg 166.7 mL/hr over 90 Minutes Intravenous Every 24 hours 09/15/20 1232 09/17/20 0956   09/15/20 1230  vancomycin (VANCOREADY) IVPB 1250 mg/250 mL        1,250 mg 166.7 mL/hr over 90 Minutes Intravenous  Once 09/15/20 1131 09/15/20 1402   09/13/20 1400  ceFEPIme (MAXIPIME) 2 g in sodium chloride 0.9 % 100 mL IVPB  2 g 200 mL/hr over 30 Minutes Intravenous Every 8 hours 09/13/20 1341  09/19/20 2208   09/06/20 0600  vancomycin (VANCOREADY) IVPB 1250 mg/250 mL        1,250 mg 166.7 mL/hr over 90 Minutes Intravenous  Once 09/06/20 0541 09/06/20 0739   09/05/20 1400  ceFAZolin (ANCEF) IVPB 1 g/50 mL premix  Status:  Discontinued        1 g 100 mL/hr over 30 Minutes Intravenous Every 8 hours 09/05/20 1120 09/13/20 1341   08/30/20 0600  vancomycin (VANCOREADY) IVPB 500 mg/100 mL  Status:  Discontinued        500 mg 100 mL/hr over 60 Minutes Intravenous Every 12 hours 08/29/20 1554 09/01/20 1115   08/29/20 1700  ceFEPIme (MAXIPIME) 2 g in sodium chloride 0.9 % 100 mL IVPB  Status:  Discontinued        2 g 200 mL/hr over 30 Minutes Intravenous Every 12 hours 08/29/20 1554 09/01/20 1115   08/29/20 1645  vancomycin (VANCOREADY) IVPB 1250 mg/250 mL        1,250 mg 166.7 mL/hr over 90 Minutes Intravenous  Once 08/29/20 1554 08/29/20 1820       Time spent: 35 minutes    Erin Hearing ANP  [facility] 7 am - 330 pm/M-F for direct patient care and secure chat Please refer to Amion for contact info 43  days

## 2020-10-10 LAB — COMPREHENSIVE METABOLIC PANEL
ALT: 97 U/L — ABNORMAL HIGH (ref 0–44)
AST: 56 U/L — ABNORMAL HIGH (ref 15–41)
Albumin: 2.5 g/dL — ABNORMAL LOW (ref 3.5–5.0)
Alkaline Phosphatase: 89 U/L (ref 38–126)
Anion gap: 7 (ref 5–15)
BUN: 21 mg/dL — ABNORMAL HIGH (ref 6–20)
CO2: 31 mmol/L (ref 22–32)
Calcium: 9.3 mg/dL (ref 8.9–10.3)
Chloride: 102 mmol/L (ref 98–111)
Creatinine, Ser: 0.7 mg/dL (ref 0.44–1.00)
GFR, Estimated: >60 mL/min (ref >60)
Glucose, Bld: 134 mg/dL — ABNORMAL HIGH (ref 70–99)
Potassium: 3.7 mmol/L (ref 3.5–5.1)
Sodium: 140 mmol/L (ref 135–145)
Total Bilirubin: 0.4 mg/dL (ref 0.3–1.2)
Total Protein: 6.9 g/dL (ref 6.5–8.1)

## 2020-10-10 LAB — GLUCOSE, CAPILLARY
Glucose-Capillary: 112 mg/dL — ABNORMAL HIGH (ref 70–99)
Glucose-Capillary: 114 mg/dL — ABNORMAL HIGH (ref 70–99)
Glucose-Capillary: 128 mg/dL — ABNORMAL HIGH (ref 70–99)
Glucose-Capillary: 129 mg/dL — ABNORMAL HIGH (ref 70–99)
Glucose-Capillary: 139 mg/dL — ABNORMAL HIGH (ref 70–99)
Glucose-Capillary: 145 mg/dL — ABNORMAL HIGH (ref 70–99)

## 2020-10-10 NOTE — Plan of Care (Signed)
  Problem: Nutrition: Goal: Risk of aspiration will decrease Outcome: Progressing Goal: Dietary intake will improve Outcome: Progressing   Problem: Intracerebral Hemorrhage Tissue Perfusion: Goal: Complications of Intracerebral Hemorrhage will be minimized Outcome: Progressing   Problem: Clinical Measurements: Goal: Ability to maintain clinical measurements within normal limits will improve Outcome: Progressing Goal: Will remain free from infection Outcome: Progressing Goal: Diagnostic test results will improve Outcome: Progressing Goal: Respiratory complications will improve Outcome: Progressing Goal: Cardiovascular complication will be avoided Outcome: Progressing   Problem: Activity: Goal: Risk for activity intolerance will decrease Outcome: Not Applicable   Problem: Nutrition: Goal: Adequate nutrition will be maintained Outcome: Progressing   Problem: Education: Goal: Knowledge of disease or condition will improve Outcome: Not Progressing Goal: Knowledge of secondary prevention will improve Outcome: Not Progressing Goal: Knowledge of patient specific risk factors addressed and post discharge goals established will improve Outcome: Not Progressing Goal: Individualized Educational Video(s) Outcome: Not Applicable   Problem: Coping: Goal: Will verbalize positive feelings about self Outcome: Not Progressing Goal: Will identify appropriate support needs Outcome: Not Progressing   Problem: Health Behavior/Discharge Planning: Goal: Ability to manage health-related needs will improve Outcome: Not Progressing   Problem: Pain Managment: Goal: General experience of comfort will improve Outcome: Progressing   Problem: Safety: Goal: Ability to remain free from injury will improve Outcome: Completed/Met

## 2020-10-10 NOTE — Progress Notes (Signed)
[facility] PROGRESS NOTE  CHARRISSE MASLEY MCN:[id number] DOB: October 19, 1965 DOA: 08/26/2020 PCP: Patient, [name]    October 08, 2020   Status: Remains inpatient appropriate because:Altered mental status, Unsafe d/c plan, IV treatments appropriate due to intensity of illness or inability to take PO and Inpatient level of care appropriate due to severity of illness   Dispo: The patient is from: Home              Anticipated d/c is to: SNF-family does not wish to place patient in SNF.  If this is the only option they would like to consider taking the patient home although this may be a difficult undertaking given current requirement for trach and understandable fears about managing such complex patient in the home environment.  They have been updated as to why this patient currently is not a candidate for aggressive rehabilitative therapies like would be provided at a brain injury recovery rehab.  **3/24 Husband clarified that he has not delegated POA status to other family members.  **3/25 LCSW called patient's husband at home.  He confirmed that he is okay with daughter managing the Medicaid and disability process and has agreed to change the address to the one requested by the daughter.  Also a letter was requested by daughter to clarify this process and this has been placed in the chart.              Patient currently is not medically stable to d/c.   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 Subsequently on 2/10, she  developed obtundation and respiratory arrest leading to intubation. She has remained persistently obtunded, now has a trach and a PEG. Hospital course complicated by persistent fever,Serratia pneumoniaand now pre-orbital cellulitis  As of 3/25 with adjustments and hypertonicity medications patient seems to be improving.  She is also waking up more although limited interaction but both eyes are now open and she tracks with the left eye.  Multiple discussions with husband who has confirmed he has not delegated other family members to be POA and he is the primary legal contact for patient.  He has agreed to let patient's daughter take the lead on completing and following up on Medicaid and disability applications including having these applications sent to her address.  2/09 Admit for [place] 2/10 EVD placed, later intubated due to obtundation and respiratory arrest 2/11 MRI Brain, hypertonic saline d/c'd, EVD not working. Husband did not consent to PICC  2/13 EVD removed 2/17 [place] discussion with family, PMT 2/24 Bedside trach and PEG 3/1 Transferred to [facility]  Subjective: Much more alert today.  Both eyes open.  Tracking of left eye.  Unable to follow simple commands.  Does have some purposeful resistance of upper extremities with PROM.  Decreased hypertonicity in upper extremities.  Objective: Vitals:   10/10/20 0435 10/10/20 0724  BP: (!) 159/95 (!) 158/87  Pulse:  82  Resp:  17  Temp:  99.5 F (37.5 C)  SpO2:  99%    Intake/Output Summary (Last 24 hours) at 10/10/2020 0826 Last data filed at 10/10/2020 [phone]  Gross per 24 hour  Intake -  Output 1900 ml  Net -1900 ml   Filed Weights   10/08/20 0318 10/09/20 0500 10/10/20 0500  Weight: 61.3 kg 63.2 kg 67.7 kg    Exam:  Constitutional: Awake, not interactive but does track with left eye.  No acute distress Respiratory: #6.0 cuffless trach, trach collar with FiO2 21%.  Lungs are clear to auscultation and no tracheal secretions noted;  Nursing states pt has positive cough w/ Tracheal suction Cardiovascular: Heart sounds are normal, essentially no tachycardia, skin warm and dry.  Adequate capillary refill Abdomen:  PEG tube for continuous tube feedings.  Soft nontender with normoactive bowel sounds.  LBM 3/24  Musculoskeletal: Torticollis with associated left neck deviation improving-neck pillow in place and family working regularly on neck PROM.  I was able to extend patient's right arm from flexion to almost full extension with hypertonicity-R>L UE hypertonicity.  No associated spasticity noted.  Patient initially resisted somewhat but eventually allowed the maneuver to be completed.  Lower extremities stable in PROF boots. Neurologic: CN 2-12 grossly intact but exam limited by inability to fully participate. Today able to partially open R eyelid (see above picture).  Psychiatric: Eyes are open and she appears to be awake and is tracking of the left.  Minimal interaction and is nonverbal so unable to gauge orientation accurately.   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 Intraventricular penetration with blood filling the 3rd and forth ventricles and nearly filling the right lateral ventricle.  -MRI 2/11 with intraparenchymal hemorrhage centered within the right thalamus and basal ganglia extending into the right midbrain with intraventricular extension. Effacement of prepontine cistern and similar 4 mm of leftward midline shift. Separate areas of ristricted diffusion and edema involving the right greater than left mesial temporal lobes / parahippocampal cortex, bilateral basal ganglia, and possibly the upper pons that are concerning for acute infarcts, most likely 2/2 mass effect from the hemorrhage (see  report)/ Per neuro "scattered b/l hippocampus, posterior CC, bilateral thalamus, and upper pons, likely due to deep penetrating vessel compression by focal mass-effect".  -2/19 MRI with acute to early subacute infarcts in the bilateral cerebral white matter and right cerebellum, new from 2/11 - interval evolution of multiple other infarcts present on prior MRI, increased hemorrhage along old right EVD tract (see report) - 2/21 head CT with expected evolution of ICH since 2/13 - Echo with normal EF, no intracardiac source of embolism - CTA head/neck without intracranial arterial occlusion or high grade stenosis, no dissection, aneurysm, or hemodynamically significant stenosis of the carotid or vertebral arteries - Head CT 3/9 with expected evolution of hemorrhage involving the right thalamus, basal ganglia, and adjacent white matter from prior exam with developing encephalomalacia.  Resolved previous intraventricular hemorrhage.  Persistent sulcal effacement with mild improvement.  Persistent 3 mm left to right midline shift.  Multiple areas of low attenuation consistent with infarcts within the central white matter, unchanged in distribution from prior.  (see report) - Per neurology- see last follow up note from 2/25 - [place] likely 2/2 hypertension and nonadherence to meds  - s/p trach/peg on 2/24  - no antithrombotic 2/2 ICH -Obstructive hydrocephalus -> s/p external ventricular catheter placement on 08/27/20 by neurosurgery - EVD clamped on 2/12 and d/c'd on 2/13 -EEG suggestive of cortical dysfunction in right hemisphere likely 2/2  underlying bleed as well as severe diffuse encephalopathy, nonspecific etiology . -3/25 increased baclofen 5 mg to TID-dose was not increased to TID on 3/21 as previously documented.  Upper extremity hypertonicity seems to be improving during PROM.   AST 55 and ALT 59 with a normal total bilirubin last week and will repeat LFTs on Wednesday and Friday of this week. -3/22 EEG  no seizures and persistent encephalopathy -Prognosis remains guarded and it is highly doubtful she will return to previous level of functioning therefore financial counseling notified to pursue disability application -3/26 tachycardia has resolved.  Spasticity controlled with skeletal muscle extras with plan to uptitrate if necessary.  Given that we will attempt Provigil 100 mg daily and uptitrate if necessary to improve current wakefulness.  Typically this only works early in course of hospitalization since patient is just now having improved wakefulness will see if this can improve situation.  Neck Contracture/torticollis -Preference for keeping head turned to the left. -See above regarding increase in baclofen dosage on 3/15 -- continue PT/OT  Hypertensive emergency/Malignant HTN Resolved -Home medications include Coreg and HCTZ which have not been reordered -Continue metoprolol, hydralazine  and amlodipine - follow, fluctuating - adjust prn  -Continue hydralazine and labetalol prn with SBP goal < 160 -suspect BP lability 2/2 pain  Recurrent Fever  Serratia Pneumonia  Hospital Acquired Pneumonia:  -Tracheal aspirate from 2/25 and 3/1 with serratia - cefepime 2/27-3/5 -There is also concern for central fever, s/p buspirone - s/p treatment for periorbital cellulitis as noted below -TM 102 3/17-CXR unremarkable-tracheal aspirate 3/21 w/o WBCs but abundant GPCs, GNRs and GPCs on Gram stain  -Culture positive for rare Serratia marcescens.  Patient at this time asymptomatic without hypoxemia or fever.  Sensitive to cipro so will go ahead and treat x 7 days for early tracheobronchitis in pt who is unable to cough effectively  Acute hypoxemic respiratory failure secondary to CVA requiring mechanical ventilation and tracheostomy tube -Stable on trach collar and has been transitioned to a #6 cuffless trach -Has significant altered mentation and inability to manage oral secretions at present  time is not a safe candidate for decannulation.  Discussed this with family as major obstacle in discharging to home.  This will also decrease options for SNF placement. -Appreciate trach team assistance in management  Bacterial Conjunctivitis/periorbital cellulitis - MRI 3/7 orbits with L periorbital cellulitis  -- CT head 3/9 with persistent subtotal opacification of paranasal sinuses and mastoid air cells, bubbly lucency at base of R maxillary sinus (likely odontogenic in origin) - CT maxillofacial 3/12 - improving cellulitis  - s/p erythromycin ointment - completed course of Ancef   Pain likely related to underlying hypertonicity -Consider adding low-dose oxycodone short acting 2.5 mg every 12 hours.  Will first monitor for response to increase in baclofen on 3/21 -Morphine IV and Tylenol prn (watch elev LFTs) -Treat underlying hypertonicity with skeletal muscle relaxers and uptitrate as tolerated with goal to improve and resolve hypertonicity without contributing to oversedation  Dysphagia/Nutrition Status: Nutrition Problem: Inadequate oral intake Etiology: inability to eat Signs/Symptoms: NPO status Interventions: Tube feeding consisting of Osmolite 1.2 Cal at 55 cc/h; continue Prosource liquid 45 cc daily, free water 100 cc every 4 hours per tube; will also continue to follow CBGs regularly while on tube feedings and provide SSI Estimated body mass index is 24.84 kg/m as calculated from the following:   Height as of this encounter: _0  (1.651 m).   Weight as of this encounter: 67.7 kg.  If patient eventually discharged to home to teach family how to bolus feed as opposed to utilizing continuous tube feeding infusions.   Other problems: Anasarca -bilateral upper extremity edema, minimal LE edema - suspect related to prolonged hospitalization, hypoalbuminemia -upper extremity US-> age indeterminate SVT of right cephalic vein  Elevated LFT's - fluctuating, continue to  monitor - follow - w/u further as indicated - continue statin for now  Leukocytosis -Resolved on antibiotics  Anemia -Stable, follow  Sinus Tachycardia Better on BB and after treatment of pain/hypertonicity Echo with EF 60-65% (see report)   Data Reviewed: Basic Metabolic Panel: Recent Labs  Lab 10/06/20 0357 10/10/20 0156  NA 140 140  K 4.0 3.7  CL 101 102  CO2 29 31  GLUCOSE 116* 134*  BUN 18 21*  CREATININE 0.71 0.70  CALCIUM 9.7 9.3   Liver Function Tests: Recent Labs  Lab 10/06/20 0357 10/10/20 0156  AST 70* 56*  ALT 111* 97*  ALKPHOS 112 89  BILITOT 0.6 0.4  PROT 7.6 6.9  ALBUMIN 2.7* 2.5*   No results for input(s): LIPASE, AMYLASE in the last 168 hours. No results for input(s): AMMONIA in the last 168 hours. CBC: Recent Labs  Lab 10/05/20 0835  WBC 7.9  HGB 9.0*  HCT 29.4*  MCV 82.4  PLT 377   Cardiac Enzymes: No results for input(s): CKTOTAL, CKMB, CKMBINDEX, TROPONINI in the last 168 hours. BNP (last 3 results) No results for input(s): BNP in the last 8760 hours.  ProBNP (last 3 results) No results for input(s): PROBNP in the last 8760 hours.  CBG: Recent Labs  Lab 10/09/20 1539 10/09/20 1946 10/09/20 2322 10/10/20 0300 10/10/20 0804  GLUCAP 130* 113* 150* 129* 128*    Recent Results (from the past 240 hour(s))  Culture, blood (Routine X 2) w Reflex to ID Panel     Status: None   Collection Time: 10/02/20  7:38 AM   Specimen: BLOOD  Result Value Ref Range Status   Specimen Description BLOOD RIGHT ANTECUBITAL  Final   Special Requests   Final    BOTTLES DRAWN AEROBIC AND ANAEROBIC Blood Culture results may not be optimal due to an inadequate volume of blood received in culture bottles   Culture   Final    NO GROWTH 5 DAYS Performed at [facility], [place] [address] [place], [place] [address]    Report Status 10/07/2020 FINAL  Final  Culture, blood (Routine X 2) w Reflex to ID Panel     Status: None   Collection Time:  10/02/20  7:42 AM   Specimen: BLOOD RIGHT HAND  Result Value Ref Range Status   Specimen Description BLOOD RIGHT HAND  Final   Special Requests   Final    BOTTLES DRAWN AEROBIC ONLY Blood Culture adequate volume   Culture   Final    NO GROWTH 5 DAYS Performed at [facility], [place] [address], [place], [place] [address]    Report Status 10/07/2020 FINAL  Final  Culture, Respiratory w Gram Stain     Status: None   Collection Time: 10/05/20  1:12 PM   Specimen: Tracheal Aspirate; Respiratory  Result Value Ref Range Status   Specimen Description TRACHEAL ASPIRATE  Final   Special Requests NONE  Final   Gram Stain   Final    NO WBC SEEN ABUNDANT GRAM POSITIVE COCCI ABUNDANT GRAM NEGATIVE RODS MODERATE GRAM POSITIVE RODS Performed at [facility], [place],  [place] [address]    Culture RARE SERRATIA MARCESCENS  Final   Report Status 10/07/2020 FINAL  Final   Organism ID, Bacteria SERRATIA MARCESCENS  Final      Susceptibility   Serratia marcescens - MIC*    CEFAZOLIN >=64 RESISTANT Resistant     CEFEPIME <=0.12 SENSITIVE Sensitive     CEFTAZIDIME <=1 SENSITIVE Sensitive     CEFTRIAXONE <=0.25 SENSITIVE Sensitive     CIPROFLOXACIN <=0.25 SENSITIVE Sensitive     GENTAMICIN <=1 SENSITIVE Sensitive     TRIMETH/SULFA <=20 SENSITIVE Sensitive     * RARE SERRATIA MARCESCENS     Studies: No results found.  Scheduled Meds: . amLODipine  5 mg Per Tube Daily  . atorvastatin  40 mg Per Tube Daily  . baclofen  5 mg Per Tube TID  . chlorhexidine gluconate (MEDLINE KIT)  15 mL Mouth Rinse BID  . Chlorhexidine Gluconate Cloth  6 each Topical Daily  . ciprofloxacin  500 mg Per Tube BID  . enoxaparin (LOVENOX) injection  40 mg Subcutaneous Q24H  . feeding supplement (PROSource TF)  45 mL Per Tube Daily  . free water  100 mL Per Tube Q4H  . hydrALAZINE  50 mg Per Tube Q8H  . insulin aspart  0-9 Units Subcutaneous Q4H  . mouth rinse  15 mL Mouth Rinse 10 times  per day  . metoprolol tartrate  100 mg Per Tube BID  . modafinil  100 mg Per Tube Daily  . pantoprazole sodium  40 mg Per Tube BID  . scopolamine  1 patch Transdermal Q72H   Continuous Infusions: . sodium chloride Stopped (09/28/20 1344)  . feeding supplement (OSMOLITE 1.2 CAL) 1,000 mL (10/09/20 1842)    Principal Problem:   ICH (intracerebral hemorrhage) (HCC) Active Problems:   Intraparenchymal hemorrhage of brain (HCC)   Acute hypoxemic respiratory failure (HCC)   Fever   Endotracheal tube present   Status post tracheostomy ([place])   Palliative care by specialist   Muscle hypertonicity   Oropharyngeal dysphagia   Brain injury with loss of consciousness ([place])   Poor prognosis   Consultants:  Palliative care  Pulm  Neurology   Procedures:  Tracheostomy  PEG tube  EEG  Echocardiogram  Antibiotics: Anti-infectives (From admission, onward)   Start     Dose/Rate Route Frequency Ordered Stop   10/08/20 0845  ciprofloxacin (CIPRO) tablet 500 mg        500 mg Per Tube 2 times daily 10/08/20 0754 10/15/20 0759   09/21/20 1545  ceFAZolin (ANCEF) IVPB 1 g/50 mL premix        1 g 100 mL/hr over 30 Minutes Intravenous Every 8 hours 09/21/20 1457 09/28/20 0647   09/20/20 0600  ceFAZolin (ANCEF) IVPB 1 g/50 mL premix  Status:  Discontinued        1 g 100 mL/hr over 30 Minutes Intravenous Every 8 hours 09/14/20 0903 09/14/20 0904   09/16/20 1300  vancomycin (VANCOREADY) IVPB 1250 mg/250 mL  Status:  Discontinued        1,250 mg 166.7 mL/hr over 90 Minutes Intravenous Every 24 hours 09/15/20 1232 09/17/20 0956   09/15/20 1230  vancomycin (VANCOREADY) IVPB 1250 mg/250 mL        1,250 mg 166.7 mL/hr over 90 Minutes Intravenous  Once 09/15/20 1131 09/15/20 1402   09/13/20 1400  ceFEPIme (MAXIPIME) 2 g in sodium chloride 0.9 % 100 mL IVPB        2 g 200 mL/hr over 30  Minutes Intravenous Every 8 hours 09/13/20 1341 09/19/20 2208   09/06/20 0600  vancomycin (VANCOREADY)  IVPB 1250 mg/250 mL        1,250 mg 166.7 mL/hr over 90 Minutes Intravenous  Once 09/06/20 0541 09/06/20 0739   09/05/20 1400  ceFAZolin (ANCEF) IVPB 1 g/50 mL premix  Status:  Discontinued        1 g 100 mL/hr over 30 Minutes Intravenous Every 8 hours 09/05/20 1120 09/13/20 1341   08/30/20 0600  vancomycin (VANCOREADY) IVPB 500 mg/100 mL  Status:  Discontinued        500 mg 100 mL/hr over 60 Minutes Intravenous Every 12 hours 08/29/20 1554 09/01/20 1115   08/29/20 1700  ceFEPIme (MAXIPIME) 2 g in sodium chloride 0.9 % 100 mL IVPB  Status:  Discontinued        2 g 200 mL/hr over 30 Minutes Intravenous Every 12 hours 08/29/20 1554 09/01/20 1115   08/29/20 1645  vancomycin (VANCOREADY) IVPB 1250 mg/250 mL        1,250 mg 166.7 mL/hr over 90 Minutes Intravenous  Once 08/29/20 1554 08/29/20 1820       Time spent: 35 minutes    Charlynne Cousins ANP  [facility] 7 am - 330 pm/M-F for direct patient care and secure chat Please refer to Amion for contact info 44  days

## 2020-10-11 LAB — GLUCOSE, CAPILLARY
Glucose-Capillary: 133 mg/dL — ABNORMAL HIGH (ref 70–99)
Glucose-Capillary: 124 mg/dL — ABNORMAL HIGH (ref 70–99)
Glucose-Capillary: 72 mg/dL (ref 70–99)
Glucose-Capillary: 113 mg/dL — ABNORMAL HIGH (ref 70–99)
Glucose-Capillary: 138 mg/dL — ABNORMAL HIGH (ref 70–99)
Glucose-Capillary: 123 mg/dL — ABNORMAL HIGH (ref 70–99)

## 2020-10-11 NOTE — Progress Notes (Signed)
[facility] PROGRESS NOTE  Charlene [name] KVQ:[id number] DOB: July 12, 1966 DOA: 08/26/2020 PCP: Patient, [name]    October 08, 2020   Status: Remains inpatient appropriate because:Altered mental status, Unsafe d/c plan, IV treatments appropriate due to intensity of illness or inability to take PO and Inpatient level of care appropriate due to severity of illness   Dispo: The patient is from: Home              Anticipated d/c is to: SNF-family does not wish to place patient in SNF.  If this is the only option they would like to consider taking the patient home although this may be a difficult undertaking given current requirement for trach and understandable fears about managing such complex patient in the home environment.  They have been updated as to why this patient currently is not a candidate for aggressive rehabilitative therapies like would be provided at a brain injury recovery rehab.  **3/24 Husband clarified that he has not delegated POA status to other family members.  **3/25 LCSW called patient's husband at home.  He confirmed that he is okay with daughter managing the Medicaid and disability process and has agreed to change the address to the one requested by the daughter.  Also a letter was requested by daughter to clarify this process and this has been placed in the chart.              Patient currently is not medically stable to d/c.   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 Subsequently on 2/10, she  developed obtundation and respiratory arrest leading to intubation. She has remained persistently obtunded, now has a trach and a PEG. Hospital course complicated by persistent fever,Serratia pneumoniaand now pre-orbital cellulitis  As of 3/25 with adjustments and hypertonicity medications patient seems to be improving.  She is also waking up more although limited interaction but both eyes are now open and she tracks with the left eye.  Multiple discussions with husband who has confirmed he has not delegated other family members to be POA and he is the primary legal contact for patient.  He has agreed to let patient's daughter take the lead on completing and following up on Medicaid and disability applications including having these applications sent to her address.  2/09 Admit for [place] 2/10 EVD placed, later intubated due to obtundation and respiratory arrest 2/11 MRI Brain, hypertonic saline d/c'd, EVD not working. Husband did not consent to PICC  2/13 EVD removed 2/17 [facility] discussion with family, PMT 2/24 Bedside trach and PEG 3/1 Transferred to [facility]  Subjective: Much more alert today.  Both eyes open.  Tracking of left eye.  Unable to follow simple commands.  Does have some purposeful resistance of upper extremities with PROM.  Decreased hypertonicity in upper extremities.  Objective: Vitals:   10/11/20 0330 10/11/20 0334  BP: (!) 153/96 (!) 153/96  Pulse: 85 85  Resp:  19  Temp:  98.5 F (36.9 C)  SpO2:  100%    Intake/Output Summary (Last 24 hours) at 10/11/2020 0732 Last data filed at 10/11/2020 [phone]  Gross per 24 hour  Intake --  Output 1250 ml  Net -1250 ml   Filed Weights   10/09/20 0500 10/10/20 0500 10/11/20 0344  Weight: 63.2 kg 67.7 kg 67.7 kg    Exam:  Constitutional: Awake, not interactive but does track with left eye.  No acute distress Respiratory: #6.0 cuffless trach, trach collar with FiO2 21%.  Lungs are clear to auscultation and no tracheal secretions  noted; Nursing states pt has positive cough w/ Tracheal suction Cardiovascular: Heart sounds are normal, essentially no tachycardia, skin warm and dry.  Adequate capillary refill Abdomen:  PEG tube for continuous tube feedings.  Soft nontender with normoactive bowel sounds.  LBM 3/24  Musculoskeletal: Torticollis with associated left neck deviation improving-neck pillow in place and family working regularly on neck PROM.  I was able to extend patient's right arm from flexion to almost full extension with hypertonicity-R>L UE hypertonicity.  No associated spasticity noted.  Patient initially resisted somewhat but eventually allowed the maneuver to be completed.  Lower extremities stable in PROF boots. Neurologic: CN 2-12 grossly intact but exam limited by inability to fully participate. Today able to partially open R eyelid (see above picture).  Psychiatric: Eyes are open and she appears to be awake and is tracking of the left.  Minimal interaction and is nonverbal so unable to gauge orientation accurately.   Assessment/Plan: Acute problems: Multiple acute strokes  Right Thalamus ICH with IVH   Obstructive Hydrocephalus/Acute on persistent metabolic Encephalopathy/brain injury related hypertonicity -CT 2/9 with acute hemorrhage with epicenter in the right thalamus. Intraventricular penetration with blood filling the 3rd and forth ventricles and nearly filling the right lateral ventricle.  -MRI 2/11 with intraparenchymal hemorrhage centered within the right thalamus and basal ganglia extending into the right midbrain with intraventricular extension. Effacement of prepontine cistern and similar 4 mm of leftward midline shift. Separate areas of ristricted diffusion and edema involving the right greater than left mesial temporal lobes / parahippocampal cortex, bilateral basal ganglia, and possibly the upper pons that are concerning for acute infarcts, most likely 2/2 mass effect from the hemorrhage (see  report)/ Per neuro "scattered b/l hippocampus, posterior CC, bilateral thalamus, and upper pons, likely due to deep penetrating vessel compression by focal mass-effect".  -2/19 MRI with acute to early subacute infarcts in the bilateral cerebral white matter and right cerebellum, new from 2/11 - interval evolution of multiple other infarcts present on prior MRI, increased hemorrhage along old right EVD tract (see report) - 2/21 head CT with expected evolution of ICH since 2/13 - Echo with normal EF, no intracardiac source of embolism - CTA head/neck without intracranial arterial occlusion or high grade stenosis, no dissection, aneurysm, or hemodynamically significant stenosis of the carotid or vertebral arteries - Head CT 3/9 with expected evolution of hemorrhage involving the right thalamus, basal ganglia, and adjacent white matter from prior exam with developing encephalomalacia.  Resolved previous intraventricular hemorrhage.  Persistent sulcal effacement with mild improvement.  Persistent 3 mm left to right midline shift.  Multiple areas of low attenuation consistent with infarcts within the central white matter, unchanged in distribution from prior.  (see report) - Per neurology- see last follow up note from 2/25 - [place] likely 2/2 hypertension and nonadherence to meds  - s/p trach/peg on 2/24  - no antithrombotic 2/2 ICH -Obstructive hydrocephalus -> s/p external ventricular catheter placement on 08/27/20 by neurosurgery - EVD clamped on 2/12 and d/c'd on 2/13 -EEG suggestive of cortical dysfunction in right hemisphere  likely 2/2 underlying bleed as well as severe diffuse encephalopathy, nonspecific etiology . -3/25 increased baclofen 5 mg to TID-dose was not increased to TID on 3/21 as previously documented.  Upper extremity hypertonicity seems to be improving during PROM.   AST 55 and ALT 59 with a normal total bilirubin last week and will repeat LFTs on Wednesday and Friday of this week. -3/22 EEG  no seizures and persistent encephalopathy -Prognosis remains guarded and it is highly doubtful she will return to previous level of functioning therefore financial counseling notified to pursue disability application -1/03 tachycardia has resolved.  Spasticity controlled with skeletal muscle extras with plan to uptitrate if necessary.  Given that we will attempt Provigil 100 mg daily and uptitrate if necessary to improve current wakefulness.  Typically this only works early in course of hospitalization since patient is just now having improved wakefulness will see if this can improve situation.  Neck Contracture/torticollis -Preference for keeping head turned to the left. -See above regarding increase in baclofen dosage on 3/15 -- continue PT/OT  Hypertensive emergency/Malignant HTN Resolved -Home medications include Coreg and HCTZ which have not been reordered -Continue metoprolol, hydralazine  and amlodipine - follow, fluctuating - adjust prn  -Continue hydralazine and labetalol prn with SBP goal < 160 -suspect BP lability 2/2 pain  Recurrent Fever   Serratia Pneumonia   Hospital Acquired Pneumonia:  -Tracheal aspirate from 2/25 and 3/1 with serratia - cefepime 2/27-3/5 -There is also concern for central fever, s/p buspirone - s/p treatment for periorbital cellulitis as noted below -TM 102 3/17-CXR unremarkable-tracheal aspirate 3/21 w/o WBCs but abundant GPCs, GNRs and GPCs on Gram stain  -Culture positive for rare Serratia marcescens.  Patient at this time asymptomatic without hypoxemia or fever.  Sensitive to cipro so will go ahead and treat x 7 days for early tracheobronchitis in pt who is unable to cough effectively  Acute hypoxemic respiratory failure secondary to CVA requiring mechanical ventilation and tracheostomy tube -Stable on trach collar and has been transitioned to a #6 cuffless trach -Has significant altered mentation and inability to manage oral secretions at present  time is not a safe candidate for decannulation.  Discussed this with family as major obstacle in discharging to home.  This will also decrease options for SNF placement. -Appreciate trach team assistance in management  Bacterial Conjunctivitis/periorbital cellulitis - MRI 3/7 orbits with L periorbital cellulitis  -- CT head 3/9 with persistent subtotal opacification of paranasal sinuses and mastoid air cells, bubbly lucency at base of R maxillary sinus (likely odontogenic in origin) - CT maxillofacial 3/12 - improving cellulitis  - s/p erythromycin ointment - completed course of Ancef   Pain likely related to underlying hypertonicity -Consider adding low-dose oxycodone short acting 2.5 mg every 12 hours.  Will first monitor for response to increase in baclofen on 3/21 -Morphine IV and Tylenol prn (watch elev LFTs) -Treat underlying hypertonicity with skeletal muscle relaxers and uptitrate as tolerated with goal to improve and resolve hypertonicity without contributing to oversedation  Dysphagia/Nutrition Status: Nutrition Problem: Inadequate oral intake Etiology: inability to eat Signs/Symptoms: NPO status Interventions: Tube feeding consisting of Osmolite 1.2 Cal at 55 cc/h; continue Prosource liquid 45 cc daily, free water 100 cc every 4 hours per tube; will also continue to follow CBGs regularly while on tube feedings and provide SSI Estimated body mass index is 24.84 kg/m as calculated from the following:   Height as of this encounter: _0  (1.651 m).   Weight as of  this encounter: 67.7 kg. If patient eventually discharged to home to teach family how to bolus feed as opposed to utilizing continuous tube feeding infusions.   Other problems: Anasarca -bilateral upper extremity edema, minimal LE edema - suspect related to prolonged hospitalization, hypoalbuminemia -upper extremity US-> age indeterminate SVT of right cephalic vein  Elevated LFT's - fluctuating, continue to  monitor - follow - w/u further as indicated - continue statin for now  Leukocytosis -Resolved on antibiotics  Anemia -Stable, follow  Sinus Tachycardia Better on BB and after treatment of pain/hypertonicity Echo with EF 60-65% (see report)   Data Reviewed: Basic Metabolic Panel: Recent Labs  Lab 10/06/20 0357 10/10/20 0156  NA 140 140  K 4.0 3.7  CL 101 102  CO2 29 31  GLUCOSE 116* 134*  BUN 18 21*  CREATININE 0.71 0.70  CALCIUM 9.7 9.3   Liver Function Tests: Recent Labs  Lab 10/06/20 0357 10/10/20 0156  AST 70* 56*  ALT 111* 97*  ALKPHOS 112 89  BILITOT 0.6 0.4  PROT 7.6 6.9  ALBUMIN 2.7* 2.5*   No results for input(s): LIPASE, AMYLASE in the last 168 hours. No results for input(s): AMMONIA in the last 168 hours. CBC: Recent Labs  Lab 10/05/20 0835  WBC 7.9  HGB 9.0*  HCT 29.4*  MCV 82.4  PLT 377   Cardiac Enzymes: No results for input(s): CKTOTAL, CKMB, CKMBINDEX, TROPONINI in the last 168 hours. BNP (last 3 results) No results for input(s): BNP in the last 8760 hours.  ProBNP (last 3 results) No results for input(s): PROBNP in the last 8760 hours.  CBG: Recent Labs  Lab 10/10/20 1553 10/10/20 1917 10/10/20 2339 10/11/20 0333 10/11/20 0730  GLUCAP 112* 114* 139* 133* 124*    Recent Results (from the past 240 hour(s))  Culture, blood (Routine X 2) w Reflex to ID Panel     Status: None   Collection Time: 10/02/20  7:38 AM   Specimen: BLOOD  Result Value Ref Range Status   Specimen Description BLOOD RIGHT ANTECUBITAL  Final   Special Requests   Final    BOTTLES DRAWN AEROBIC AND ANAEROBIC Blood Culture results may not be optimal due to an inadequate volume of blood received in culture bottles   Culture   Final    NO GROWTH 5 DAYS Performed at [facility], [place] [address] [place], [place] [address]    Report Status 10/07/2020 FINAL  Final  Culture, blood (Routine X 2) w Reflex to ID Panel     Status: None   Collection Time:  10/02/20  7:42 AM   Specimen: BLOOD RIGHT HAND  Result Value Ref Range Status   Specimen Description BLOOD RIGHT HAND  Final   Special Requests   Final    BOTTLES DRAWN AEROBIC ONLY Blood Culture adequate volume   Culture   Final    NO GROWTH 5 DAYS Performed at [facility], [place] [address] [place], [place] [address]    Report Status 10/07/2020 FINAL  Final  Culture, Respiratory w Gram Stain     Status: None   Collection Time: 10/05/20  1:12 PM   Specimen: Tracheal Aspirate; Respiratory  Result Value Ref Range Status   Specimen Description TRACHEAL ASPIRATE  Final   Special Requests NONE  Final   Gram Stain   Final    NO WBC SEEN ABUNDANT GRAM POSITIVE COCCI ABUNDANT GRAM NEGATIVE RODS MODERATE GRAM POSITIVE RODS Performed at [facility], [address] [place], [place] [address]    Culture RARE SERRATIA MARCESCENS  Final   Report Status 10/07/2020 FINAL  Final   Organism ID, Bacteria SERRATIA MARCESCENS  Final      Susceptibility   Serratia marcescens - MIC*    CEFAZOLIN >=64 RESISTANT Resistant     CEFEPIME <=0.12 SENSITIVE Sensitive     CEFTAZIDIME <=1 SENSITIVE Sensitive     CEFTRIAXONE <=0.25 SENSITIVE Sensitive     CIPROFLOXACIN <=0.25 SENSITIVE Sensitive     GENTAMICIN <=1 SENSITIVE Sensitive     TRIMETH/SULFA <=20 SENSITIVE Sensitive     * RARE SERRATIA MARCESCENS     Studies: No results found.  Scheduled Meds:  amLODipine  5 mg Per Tube Daily   atorvastatin  40 mg Per Tube Daily   baclofen  5 mg Per Tube TID   chlorhexidine gluconate (MEDLINE KIT)  15 mL Mouth Rinse BID   Chlorhexidine Gluconate Cloth  6 each Topical Daily   ciprofloxacin  500 mg Per Tube BID   enoxaparin (LOVENOX) injection  40 mg Subcutaneous Q24H   feeding supplement (PROSource TF)  45 mL Per Tube Daily   free water  100 mL Per Tube Q4H   hydrALAZINE  50 mg Per Tube Q8H   insulin aspart  0-9 Units Subcutaneous Q4H   mouth rinse  15 mL Mouth Rinse 10 times  per day   metoprolol tartrate  100 mg Per Tube BID   modafinil  100 mg Per Tube Daily   pantoprazole sodium  40 mg Per Tube BID   scopolamine  1 patch Transdermal Q72H   Continuous Infusions:  sodium chloride Stopped (09/28/20 1344)   feeding supplement (OSMOLITE 1.2 CAL) 1,000 mL (10/10/20 1804)    Principal Problem:   ICH (intracerebral hemorrhage) (HCC) Active Problems:   Intraparenchymal hemorrhage of brain (HCC)   Acute hypoxemic respiratory failure (HCC)   Fever   Endotracheal tube present   Status post tracheostomy ([place])   Palliative care by specialist   Muscle hypertonicity   Oropharyngeal dysphagia   Brain injury with loss of consciousness ([place])   Poor prognosis   Consultants:  Palliative care  Pulm  Neurology   Procedures:  Tracheostomy  PEG tube  EEG  Echocardiogram  Antibiotics: Anti-infectives (From admission, onward)   Start     Dose/Rate Route Frequency Ordered Stop   10/08/20 0845  ciprofloxacin (CIPRO) tablet 500 mg        500 mg Per Tube 2 times daily 10/08/20 0754 10/15/20 0759   09/21/20 1545  ceFAZolin (ANCEF) IVPB 1 g/50 mL premix        1 g 100 mL/hr over 30 Minutes Intravenous Every 8 hours 09/21/20 1457 09/28/20 0647   09/20/20 0600  ceFAZolin (ANCEF) IVPB 1 g/50 mL premix  Status:  Discontinued        1 g 100 mL/hr over 30 Minutes Intravenous Every 8 hours 09/14/20 0903 09/14/20 0904   09/16/20 1300  vancomycin (VANCOREADY) IVPB 1250 mg/250 mL  Status:  Discontinued        1,250 mg 166.7 mL/hr over 90 Minutes Intravenous Every 24 hours 09/15/20 1232 09/17/20 0956   09/15/20 1230  vancomycin (VANCOREADY) IVPB 1250 mg/250 mL        1,250 mg 166.7 mL/hr over 90 Minutes Intravenous  Once 09/15/20 1131 09/15/20 1402   09/13/20 1400  ceFEPIme (MAXIPIME) 2 g in sodium chloride 0.9 % 100 mL IVPB        2 g  200 mL/hr over 30 Minutes Intravenous Every 8 hours 09/13/20 1341 09/19/20 2208   09/06/20 0600  vancomycin (VANCOREADY)  IVPB 1250 mg/250 mL        1,250 mg 166.7 mL/hr over 90 Minutes Intravenous  Once 09/06/20 0541 09/06/20 0739   09/05/20 1400  ceFAZolin (ANCEF) IVPB 1 g/50 mL premix  Status:  Discontinued        1 g 100 mL/hr over 30 Minutes Intravenous Every 8 hours 09/05/20 1120 09/13/20 1341   08/30/20 0600  vancomycin (VANCOREADY) IVPB 500 mg/100 mL  Status:  Discontinued        500 mg 100 mL/hr over 60 Minutes Intravenous Every 12 hours 08/29/20 1554 09/01/20 1115   08/29/20 1700  ceFEPIme (MAXIPIME) 2 g in sodium chloride 0.9 % 100 mL IVPB  Status:  Discontinued        2 g 200 mL/hr over 30 Minutes Intravenous Every 12 hours 08/29/20 1554 09/01/20 1115   08/29/20 1645  vancomycin (VANCOREADY) IVPB 1250 mg/250 mL        1,250 mg 166.7 mL/hr over 90 Minutes Intravenous  Once 08/29/20 1554 08/29/20 1820       Time spent: 35 minutes    Charlynne Cousins ANP  [facility] 7 am - 330 pm/M-F for direct patient care and secure chat Please refer to Amion for contact info 45  days

## 2020-10-11 NOTE — Progress Notes (Signed)
CCMD called RN to report ST elevation if the patient's V1 lead, strip saved to the chart, MD made aware and asked to review, no new orders at this time.

## 2020-10-12 LAB — GLUCOSE, CAPILLARY
Glucose-Capillary: 122 mg/dL — ABNORMAL HIGH (ref 70–99)
Glucose-Capillary: 121 mg/dL — ABNORMAL HIGH (ref 70–99)
Glucose-Capillary: 74 mg/dL (ref 70–99)
Glucose-Capillary: 149 mg/dL — ABNORMAL HIGH (ref 70–99)
Glucose-Capillary: 115 mg/dL — ABNORMAL HIGH (ref 70–99)
Glucose-Capillary: 131 mg/dL — ABNORMAL HIGH (ref 70–99)

## 2020-10-12 LAB — CBC WITH DIFFERENTIAL/PLATELET
Abs Immature Granulocytes: 0.03 10*3/uL (ref 0.00–0.07)
Basophils Absolute: 0 10*3/uL (ref 0.0–0.1)
Basophils Relative: 1 %
Eosinophils Absolute: 0.1 10*3/uL (ref 0.0–0.5)
Eosinophils Relative: 1 %
HCT: 32.2 % — ABNORMAL LOW (ref 36.0–46.0)
Hemoglobin: 9.9 g/dL — ABNORMAL LOW (ref 12.0–15.0)
Immature Granulocytes: 0 %
Lymphocytes Relative: 28 %
Lymphs Abs: 2.2 10*3/uL (ref 0.7–4.0)
MCH: 26 pg (ref 26.0–34.0)
MCHC: 30.7 g/dL (ref 30.0–36.0)
MCV: 84.5 fL (ref 80.0–100.0)
Monocytes Absolute: 0.6 10*3/uL (ref 0.1–1.0)
Monocytes Relative: 8 %
Neutro Abs: 4.8 10*3/uL (ref 1.7–7.7)
Neutrophils Relative %: 62 %
Platelets: 384 10*3/uL (ref 150–400)
RBC: 3.81 MIL/uL — ABNORMAL LOW (ref 3.87–5.11)
RDW: 17.1 % — ABNORMAL HIGH (ref 11.5–15.5)
WBC: 7.6 10*3/uL (ref 4.0–10.5)
nRBC: 0 % (ref 0.0–0.2)

## 2020-10-12 LAB — COMPREHENSIVE METABOLIC PANEL
ALT: 104 U/L — ABNORMAL HIGH (ref 0–44)
AST: 65 U/L — ABNORMAL HIGH (ref 15–41)
Albumin: 2.8 g/dL — ABNORMAL LOW (ref 3.5–5.0)
Alkaline Phosphatase: 105 U/L (ref 38–126)
Anion gap: 8 (ref 5–15)
BUN: 22 mg/dL — ABNORMAL HIGH (ref 6–20)
CO2: 29 mmol/L (ref 22–32)
Calcium: 9.6 mg/dL (ref 8.9–10.3)
Chloride: 103 mmol/L (ref 98–111)
Creatinine, Ser: 0.79 mg/dL (ref 0.44–1.00)
GFR, Estimated: >60 mL/min (ref >60)
Glucose, Bld: 83 mg/dL (ref 70–99)
Potassium: 4.2 mmol/L (ref 3.5–5.1)
Sodium: 140 mmol/L (ref 135–145)
Total Bilirubin: 0.4 mg/dL (ref 0.3–1.2)
Total Protein: 7.3 g/dL (ref 6.5–8.1)

## 2020-10-12 LAB — C-REACTIVE PROTEIN: CRP: 1.4 mg/dL — ABNORMAL HIGH (ref <1.0)

## 2020-10-12 NOTE — Progress Notes (Addendum)
[facility] PROGRESS NOTE  Charlene [name] MRN:[id number] DOB: 03/18/1966 DOA: 08/26/2020 PCP: Patient, [name]    October 08, 2020   Status: Remains inpatient appropriate because:Altered mental status, Unsafe d/c plan, IV treatments appropriate due to intensity of illness or inability to take PO and Inpatient level of care appropriate due to severity of illness   Dispo: The patient is from: Home              Anticipated d/c is to: SNF-family does not wish to place patient in SNF.  If this is the only option they would like to consider taking the patient home although this may be a difficult undertaking given current requirement for trach and understandable fears about managing such complex patient in the home environment.  They have been updated as to why this patient currently is not a candidate for aggressive rehabilitative therapies like would be provided at a brain injury recovery rehab.  **3/24 Husband clarified that he has not delegated POA status to other family members.  **3/25 LCSW called patient's husband at home.  He confirmed that he is okay with daughter managing the Medicaid and disability process and has agreed to change the address to the one requested by the daughter.  Also a letter was requested by daughter to clarify this process and this has been placed in the chart.              Patient currently is not medically stable to d/c.   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 Subsequently on 2/10, she  developed obtundation and respiratory arrest leading to intubation. She has remained persistently obtunded, now has a trach and a PEG. Hospital course complicated by persistent fever,Serratia pneumoniaand now pre-orbital cellulitis  As of 3/25 with adjustments and hypertonicity medications patient seems to be improving.  She is also waking up more although limited interaction but both eyes are now open and she tracks with the left eye.  Multiple discussions with husband who has confirmed he has not delegated other family members to be POA and he is the primary legal contact for patient.  He has agreed to let patient's daughter take the lead on completing and following up on Medicaid and disability applications including having these applications sent to her address.  2/09 Admit for ICH 2/10 EVD placed, later intubated due to obtundation and respiratory arrest 2/11 MRI Brain, hypertonic saline d/c'd, EVD not working. Husband did not consent to PICC  2/13 EVD removed 2/17 GOC discussion with family, PMT 2/24 Bedside trach and PEG 3/1 Transferred to [facility]  Subjective: Awake with eyes open.  Took some interaction per myself and her RN before she began tracking us around the room.  Was unable to follow simple commands.  There was some yellow-green secretions and a rag that have been placed by her mouth and it appears she has been coughing up some secretions.  There are no secretions noted of similar appearance of her trach site.  Objective: Vitals:   10/12/20 0354 10/12/20 0517  BP: (!) 142/96 115/70  Pulse: 93   Resp:   19   Temp: 98.1 F (36.7 C)   SpO2:      Intake/Output Summary (Last 24 hours) at 10/12/2020 0759 Last data filed at 10/12/2020 0521 Gross per 24 hour  Intake 100 ml  Output 700 ml  Net -600 ml   Filed Weights   10/10/20 0500 10/11/20 0344 10/12/20 0405  Weight: 67.7 kg 67.7 kg 67.5 kg    Exam:  Constitutional: Awake and calm and in no acute distress Respiratory:  #6.0 cuffless trach, trach collar with FiO2 21% at 5L, goal patient does have yellow-green drainage that appears to have come from the mouth is unclear if she is coughing up secretions or this is sinus drainage, no increased work of breathing Cardiovascular: Normal heart sounds, no further significant tachycardia with heart rates typically in the 70-80 range, no peripheral edema Abdomen:  PEG tube for continuous tube feedings.  Abdominal exam unremarkable with normoactive bowel sounds.  LBM 3/24  Musculoskeletal: Torticollis with associated left neck deviation improving-neck pillow in place and family working regularly on neck PROM.  I was able to extend patient's right arm from flexion to almost full extension with hypertonicity-R>L UE hypertonicity.  No associated spasticity noted.  Patient initially resisted somewhat but eventually allowed the maneuver to be completed.  Lower extremities stable in PROF boots. Neurologic: CN 2-12 grossly intact, able to open both eyelids but only able to track with left eyes.  Does have some spontaneous nonpurposeful movement of the upper extremities with underlying hypertonicity and spasticity of the upper extremities.  Less notable in the lower extremities.  Splints in place on both upper and lower extremities. Psychiatric: Bland affect when awake.  Nonverbal.   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 Intraventricular penetration with blood filling the 3rd and forth ventricles and nearly filling the right lateral ventricle.  -MRI 2/11 with intraparenchymal hemorrhage centered within the right thalamus and basal ganglia extending into the right midbrain with intraventricular extension. Effacement of prepontine cistern and similar 4 mm of leftward  midline shift. Separate areas of ristricted diffusion and edema involving the right greater than left mesial temporal lobes / parahippocampal cortex, bilateral basal ganglia, and possibly the upper pons that are concerning for acute infarcts, most likely 2/2 mass effect from the hemorrhage (see report)/ Per neuro "scattered b/l hippocampus, posterior CC, bilateral thalamus, and upper pons, likely due to deep penetrating vessel compression by focal mass-effect".  -2/19 MRI with acute to early subacute infarcts in the bilateral cerebral white matter and right cerebellum, new from 2/11 - interval evolution of multiple other infarcts present on prior MRI, increased hemorrhage along old right EVD tract (see report) - 2/21 head CT with expected evolution of ICH since 2/13 - Echo with normal EF, no intracardiac source of embolism - CTA head/neck without intracranial arterial occlusion or high grade stenosis, no dissection, aneurysm, or hemodynamically significant stenosis of the carotid or vertebral arteries - Head CT 3/9 with expected evolution of hemorrhage involving the right thalamus, basal ganglia, and adjacent white matter from prior exam with developing encephalomalacia.  Resolved previous intraventricular hemorrhage.  Persistent sulcal effacement with mild improvement.  Persistent 3 mm left to right midline shift.  Multiple areas of low attenuation consistent with infarcts within the central white matter, unchanged in distribution from prior.  (see report) - Per neurology- see last follow up note   from 2/25 - [place] likely 2/2 hypertension and nonadherence to meds  - s/p trach/peg on 2/24  - no antithrombotic 2/2 ICH -Obstructive hydrocephalus -> s/p external ventricular catheter placement on 08/27/20 by neurosurgery - EVD clamped on 2/12 and d/c'd on 2/13 -EEG suggestive of cortical dysfunction in right hemisphere likely 2/2 underlying bleed as well as severe diffuse encephalopathy, nonspecific etiology  . -3/25 increased baclofen 5 mg to TID-dose was not increased to TID on 3/21 as previously documented.  Upper extremity hypertonicity seems to be improving during PROM.   AST 55 and ALT 59 with a normal total bilirubin last week and will repeat LFTs on Wednesday and Friday of this week. -3/22 EEG no seizures and persistent encephalopathy -Prognosis remains guarded and it is highly doubtful she will return to previous level of functioning therefore financial counseling notified to pursue disability application -2/63 tachycardia has resolved. Provigil initiated.  Persistent pansinusitis -Patient continues to have yellow-green drainage likely from sinuses -Has completed antibiotics recently for bilateral orbital cellulitis and is currently on Cipro for tracheal aspirate positive for Serratia marcescens with last dose due 3/31 -Continue to follow; will check CBC, c-Met and CRP today -3/28 axillary temp of 99.8 which correlates with an oral temperature of 100.8. -CT sinuses on 3/12 demonstrated persistent pansinusitis.  Consider repeat CT of sinuses if symptoms persist  Neck Contracture/torticollis -Preference for keeping head turned to the left. -See above regarding increase in baclofen dosage on 3/15 -- continue PT/OT  Hypertensive emergency/Malignant HTN Resolved -Home medications include Coreg and HCTZ which have not been reordered -Continue metoprolol, hydralazine  and amlodipine - follow, fluctuating - adjust prn  -Continue hydralazine and labetalol prn with SBP goal < 160 -suspect BP lability 2/2 pain  Recurrent Fever  Serratia Pneumonia  Hospital Acquired Pneumonia:  -Tracheal aspirate from 2/25 and 3/1 with serratia - cefepime 2/27-3/5 -There is also concern for central fever, s/p buspirone - s/p treatment for periorbital cellulitis as noted below -TM 102 3/17-CXR unremarkable-tracheal aspirate 3/21 w/o WBCs but abundant GPCs, GNRs and GPCs on Gram stain  -Sputum culture  positive for Serratia marcescens.  Started on Cipro on 3/24 with last dose is due 3/31  Acute hypoxemic respiratory failure secondary to CVA requiring mechanical ventilation and tracheostomy tube -Stable on trach collar and has been transitioned to a #6 cuffless trach -Has significant altered mentation and inability to manage oral secretions at present time is not a safe candidate for decannulation.  Discussed this with family as major obstacle in discharging to home.  This will also decrease options for SNF placement. -Appreciate trach team assistance in management  Bacterial Conjunctivitis/periorbital cellulitis - MRI 3/7 orbits with L periorbital cellulitis  -- CT head 3/9 with persistent subtotal opacification of paranasal sinuses and mastoid air cells, bubbly lucency at base of R maxillary sinus (likely odontogenic in origin) - CT maxillofacial 3/12 - improving cellulitis  - s/p erythromycin ointment - completed course of Ancef   Pain likely related to underlying hypertonicity -Consider adding low-dose oxycodone short acting 2.5 mg every 12 hours.  Will first monitor for response to increase in baclofen on 3/21 -Morphine IV and Tylenol prn (watch elev LFTs) -Treat underlying hypertonicity with skeletal muscle relaxers and uptitrate as tolerated with goal to improve and resolve hypertonicity without contributing to oversedation  Dysphagia/Nutrition Status: Nutrition Problem: Inadequate oral intake Etiology: inability to eat Signs/Symptoms: NPO status Interventions: Tube feeding consisting of Osmolite 1.2 Cal at 55 cc/h; continue Prosource liquid 45 cc daily, free  water 100 cc every 4 hours per tube; will also continue to follow CBGs regularly while on tube feedings and provide SSI Estimated body mass index is 24.76 kg/m as calculated from the following:   Height as of this encounter: 5' 5" (1.651 m).   Weight as of this encounter: 67.5 kg. If patient eventually discharged to home to  teach family how to bolus feed as opposed to utilizing continuous tube feeding infusions.   Other problems: Anasarca -bilateral upper extremity edema, minimal LE edema - suspect related to prolonged hospitalization, hypoalbuminemia -upper extremity US-> age indeterminate SVT of right cephalic vein  Elevated LFT's - fluctuating, continue to monitor - follow - w/u further as indicated - continue statin for now  Leukocytosis -Resolved on antibiotics  Anemia -Stable, follow  Sinus Tachycardia Better on BB and after treatment of pain/hypertonicity Echo with EF 60-65% (see report)   Data Reviewed: Basic Metabolic Panel: Recent Labs  Lab 10/06/20 0357 10/10/20 0156  NA 140 140  K 4.0 3.7  CL 101 102  CO2 29 31  GLUCOSE 116* 134*  BUN 18 21*  CREATININE 0.71 0.70  CALCIUM 9.7 9.3   Liver Function Tests: Recent Labs  Lab 10/06/20 0357 10/10/20 0156  AST 70* 56*  ALT 111* 97*  ALKPHOS 112 89  BILITOT 0.6 0.4  PROT 7.6 6.9  ALBUMIN 2.7* 2.5*   No results for input(s): LIPASE, AMYLASE in the last 168 hours. No results for input(s): AMMONIA in the last 168 hours. CBC: Recent Labs  Lab 10/05/20 0835  WBC 7.9  HGB 9.0*  HCT 29.4*  MCV 82.4  PLT 377   Cardiac Enzymes: No results for input(s): CKTOTAL, CKMB, CKMBINDEX, TROPONINI in the last 168 hours. BNP (last 3 results) No results for input(s): BNP in the last 8760 hours.  ProBNP (last 3 results) No results for input(s): PROBNP in the last 8760 hours.  CBG: Recent Labs  Lab 10/11/20 1607 10/11/20 2014 10/11/20 2309 10/12/20 0359 10/12/20 0756  GLUCAP 113* 138* 123* 122* 121*    Recent Results (from the past 240 hour(s))  Culture, Respiratory w Gram Stain     Status: None   Collection Time: 10/05/20  1:12 PM   Specimen: Tracheal Aspirate; Respiratory  Result Value Ref Range Status   Specimen Description TRACHEAL ASPIRATE  Final   Special Requests NONE  Final   Gram Stain   Final    NO WBC  SEEN ABUNDANT GRAM POSITIVE COCCI ABUNDANT GRAM NEGATIVE RODS MODERATE GRAM POSITIVE RODS Performed at [facility], [address] [place], [place] [address]    Culture RARE SERRATIA MARCESCENS  Final   Report Status 10/07/2020 FINAL  Final   Organism ID, Bacteria SERRATIA MARCESCENS  Final      Susceptibility   Serratia marcescens - MIC*    CEFAZOLIN >=64 RESISTANT Resistant     CEFEPIME <=0.12 SENSITIVE Sensitive     CEFTAZIDIME <=1 SENSITIVE Sensitive     CEFTRIAXONE <=0.25 SENSITIVE Sensitive     CIPROFLOXACIN <=0.25 SENSITIVE Sensitive     GENTAMICIN <=1 SENSITIVE Sensitive     TRIMETH/SULFA <=20 SENSITIVE Sensitive     * RARE SERRATIA MARCESCENS     Studies: No results found.  Scheduled Meds: . amLODipine  5 mg Per Tube Daily  . atorvastatin  40 mg Per Tube Daily  . baclofen  5 mg Per Tube TID  . chlorhexidine gluconate (MEDLINE KIT)  15 mL Mouth Rinse BID  . Chlorhexidine Gluconate Cloth    6 each Topical Daily  . ciprofloxacin  500 mg Per Tube BID  . enoxaparin (LOVENOX) injection  40 mg Subcutaneous Q24H  . feeding supplement (PROSource TF)  45 mL Per Tube Daily  . free water  100 mL Per Tube Q4H  . hydrALAZINE  50 mg Per Tube Q8H  . insulin aspart  0-9 Units Subcutaneous Q4H  . mouth rinse  15 mL Mouth Rinse 10 times per day  . metoprolol tartrate  100 mg Per Tube BID  . modafinil  100 mg Per Tube Daily  . pantoprazole sodium  40 mg Per Tube BID  . scopolamine  1 patch Transdermal Q72H   Continuous Infusions: . sodium chloride Stopped (09/28/20 1344)  . feeding supplement (OSMOLITE 1.2 CAL) 55 mL/hr at 10/12/20 0600    Principal Problem:   ICH (intracerebral hemorrhage) (HCC) Active Problems:   Intraparenchymal hemorrhage of brain (HCC)   Acute hypoxemic respiratory failure (HCC)   Fever   Endotracheal tube present   Status post tracheostomy ([place])   Palliative care by specialist   Muscle hypertonicity   Oropharyngeal dysphagia   Brain injury  with loss of consciousness ([place])   Poor prognosis   Consultants:  Palliative care  Pulm  Neurology   Procedures:  Tracheostomy  PEG tube  EEG  Echocardiogram  Antibiotics: Anti-infectives (From admission, onward)   Start     Dose/Rate Route Frequency Ordered Stop   10/08/20 0845  ciprofloxacin (CIPRO) tablet 500 mg        500 mg Per Tube 2 times daily 10/08/20 0754 10/15/20 0759   09/21/20 1545  ceFAZolin (ANCEF) IVPB 1 g/50 mL premix        1 g 100 mL/hr over 30 Minutes Intravenous Every 8 hours 09/21/20 1457 09/28/20 0647   09/20/20 0600  ceFAZolin (ANCEF) IVPB 1 g/50 mL premix  Status:  Discontinued        1 g 100 mL/hr over 30 Minutes Intravenous Every 8 hours 09/14/20 0903 09/14/20 0904   09/16/20 1300  vancomycin (VANCOREADY) IVPB 1250 mg/250 mL  Status:  Discontinued        1,250 mg 166.7 mL/hr over 90 Minutes Intravenous Every 24 hours 09/15/20 1232 09/17/20 0956   09/15/20 1230  vancomycin (VANCOREADY) IVPB 1250 mg/250 mL        1,250 mg 166.7 mL/hr over 90 Minutes Intravenous  Once 09/15/20 1131 09/15/20 1402   09/13/20 1400  ceFEPIme (MAXIPIME) 2 g in sodium chloride 0.9 % 100 mL IVPB        2 g 200 mL/hr over 30 Minutes Intravenous Every 8 hours 09/13/20 1341 09/19/20 2208   09/06/20 0600  vancomycin (VANCOREADY) IVPB 1250 mg/250 mL        1,250 mg 166.7 mL/hr over 90 Minutes Intravenous  Once 09/06/20 0541 09/06/20 0739   09/05/20 1400  ceFAZolin (ANCEF) IVPB 1 g/50 mL premix  Status:  Discontinued        1 g 100 mL/hr over 30 Minutes Intravenous Every 8 hours 09/05/20 1120 09/13/20 1341   08/30/20 0600  vancomycin (VANCOREADY) IVPB 500 mg/100 mL  Status:  Discontinued        500 mg 100 mL/hr over 60 Minutes Intravenous Every 12 hours 08/29/20 1554 09/01/20 1115   08/29/20 1700  ceFEPIme (MAXIPIME) 2 g in sodium chloride 0.9 % 100 mL IVPB  Status:  Discontinued        2 g 200 mL/hr over 30 Minutes Intravenous Every 12 hours 08/29/20 1554  09/01/20  1115   08/29/20 1645  vancomycin (VANCOREADY) IVPB 1250 mg/250 mL        1,250 mg 166.7 mL/hr over 90 Minutes Intravenous  Once 08/29/20 1554 08/29/20 1820       Time spent: 35 minutes    Erin Hearing ANP  [facility] 7 am - 330 pm/M-F for direct patient care and secure chat Please refer to Amion for contact info 46  days

## 2020-10-12 NOTE — Progress Notes (Signed)
Nutrition Follow-up  DOCUMENTATION CODES:   Not applicable  INTERVENTION:  Continue Tube feeding via PEG tube: Osmolite 1.2 at 55 ml/h (1320 ml per day) 45 ml ProSource TF daily  Provides 1624 kcal, 84 gm protein, 1070 ml free water daily  100 ml free water every 4 hours    NUTRITION DIAGNOSIS:   Inadequate oral intake related to inability to eat as evidenced by NPO status.  ongoing  GOAL:   Patient will meet greater than or equal to 90% of their needs  Met with TF  MONITOR:   TF tolerance,Vent status  REASON FOR ASSESSMENT:   Consult,Ventilator Enteral/tube feeding initiation and management  ASSESSMENT:   Pt with PMH of HTN with medication noncompliance admitted with hypertensive thalamic ICH.  2/10 s/p EVD placement and intubation for respiratory arrest 2/13 EVD removed 2/17 palliative care consult; family requesting tx to [facility]  2/24 s/p trach and PEG  3/1 tx to [facility] 3/11 trach changed to shiley #6 cuffless  Discharge remains difficult.   Pt remains unresponsive. Continues to receive TF via PEG. Current regimen: Osmolite 1.2 at 55 ml/h w/ 45 ml ProSource TF daily, 135m free water flushes Q4H. Tolerating well per RN.   UOP: 11562mdocumented today  Admit wt: 61.2 kg Current wt: 67.5 kg  Generalized non-pitting edema noted per RN assessment  Medications: SSI Q4H, protonix Labs reviewed. CBGs [id number] Order:   Diet Order            Diet NPO time specified  Diet effective midnight                 EDUCATION NEEDS:   No education needs have been identified at this time  Skin:  Skin Assessment: Reviewed RN Assessment  Last BM:  3/24  Height:   Ht Readings from Last 1 Encounters:  08/27/20 '5\' 5"'  (1.651 m)    Weight:   Wt Readings from Last 1 Encounters:  10/12/20 67.5 kg    Ideal Body Weight:  56.8 kg  BMI:  Body mass index is 24.76 kg/m.  Estimated Nutritional Needs:   Kcal:  1600-1800  Protein:  80-90  grams  Fluid:  >1.6L/d    AmLarkin InaMS, RD, LDN RD pager number and weekend/on-call pager number located in [facility]

## 2020-10-12 NOTE — Plan of Care (Signed)
  Problem: Education: Goal: Knowledge of disease or condition will improve Outcome: Progressing Goal: Knowledge of secondary prevention will improve Outcome: Progressing Goal: Knowledge of patient specific risk factors addressed and post discharge goals established will improve Outcome: Progressing   Problem: Coping: Goal: Will verbalize positive feelings about self Outcome: Progressing Goal: Will identify appropriate support needs Outcome: Progressing   Problem: Health Behavior/Discharge Planning: Goal: Ability to manage health-related needs will improve Outcome: Progressing   Problem: Self-Care: Goal: Ability to participate in self-care as condition permits will improve Outcome: Progressing Goal: Verbalization of feelings and concerns over difficulty with self-care will improve Outcome: Progressing Goal: Ability to communicate needs accurately will improve Outcome: Progressing   Problem: Nutrition: Goal: Risk of aspiration will decrease Outcome: Progressing Goal: Dietary intake will improve Outcome: Progressing   Problem: Education: Goal: Knowledge of General Education information will improve Description: Including pain rating scale, medication(s)/side effects and non-pharmacologic comfort measures Outcome: Progressing

## 2020-10-13 ENCOUNTER — Inpatient Hospital Stay (HOSPITAL_COMMUNITY): Payer: Medicaid Other

## 2020-10-13 LAB — GLUCOSE, CAPILLARY
Glucose-Capillary: 122 mg/dL — ABNORMAL HIGH (ref 70–99)
Glucose-Capillary: 141 mg/dL — ABNORMAL HIGH (ref 70–99)
Glucose-Capillary: 131 mg/dL — ABNORMAL HIGH (ref 70–99)
Glucose-Capillary: 116 mg/dL — ABNORMAL HIGH (ref 70–99)
Glucose-Capillary: 130 mg/dL — ABNORMAL HIGH (ref 70–99)
Glucose-Capillary: 142 mg/dL — ABNORMAL HIGH (ref 70–99)

## 2020-10-13 IMAGING — CT CT PARANASAL SINUSES LIMITED
3 of 4 series · 14 of 47 positions shown, 16 images · non-contrast
Comparison: [DATE]

CLINICAL DATA: Sinusitis.  Persistent purulent drainage.

EXAM:
CT PARANASAL SINUS LIMITED WITHOUT CONTRAST
TECHNIQUE: Multidetector CT images of the paranasal sinuses were obtained using
the standard protocol without intravenous contrast.

[Series 3: sinus 2.0 h60s · axial · 0.34mm/px · z∈[-452,-356]mm · 8 of 58 slices shown, 10 images]
[im 5/58  brain]
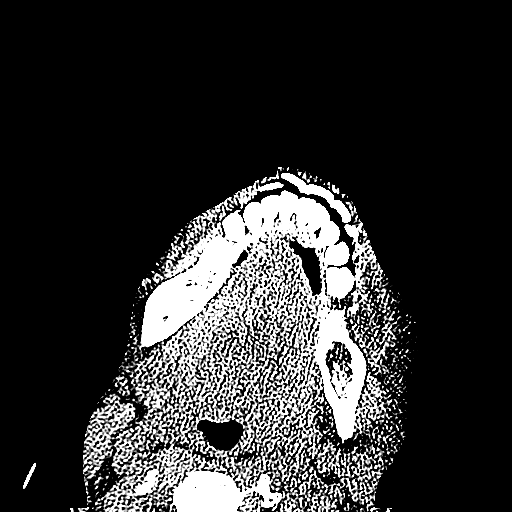
[im 5/58  bone]
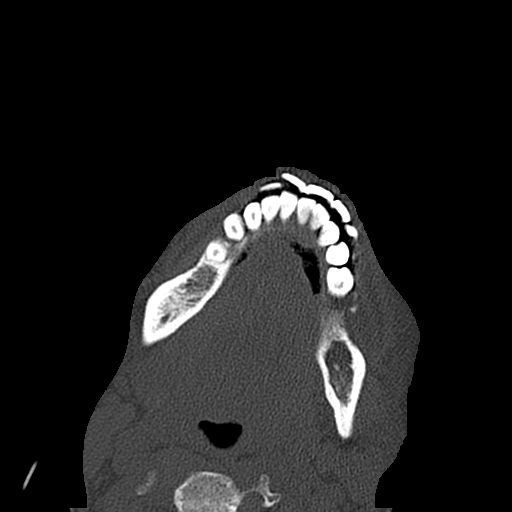
[im 13/58  bone]
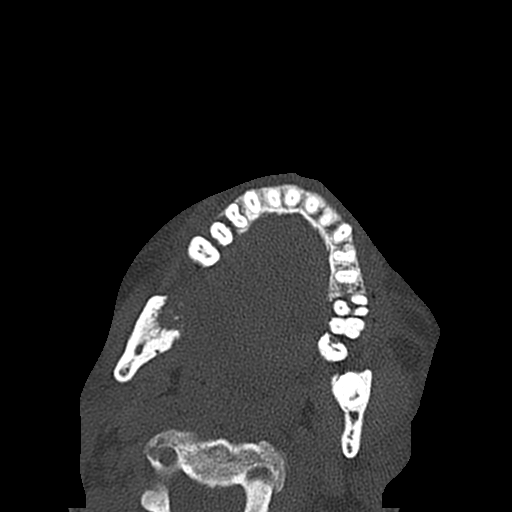
[im 21/58  bone]
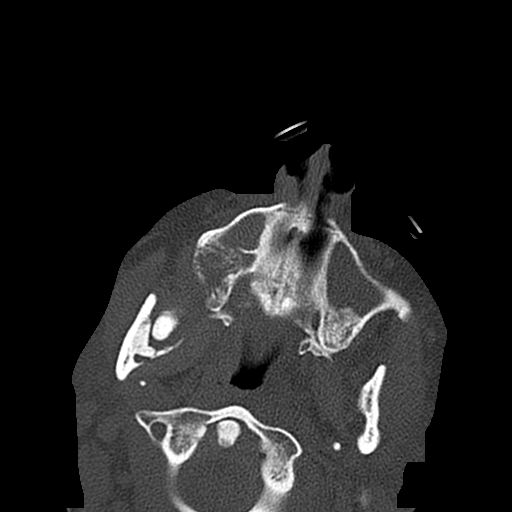
[im 25/58  bone]
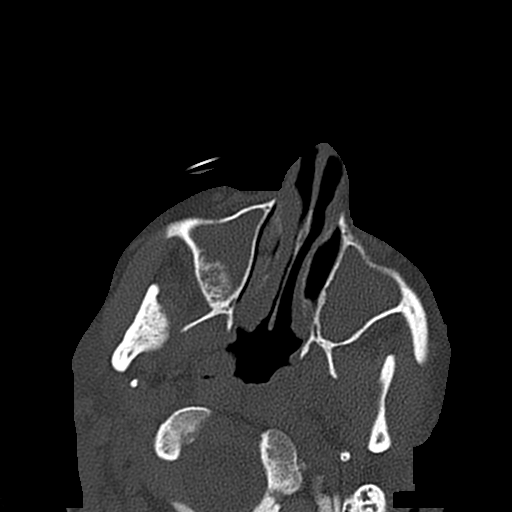
[im 33/58  brain]
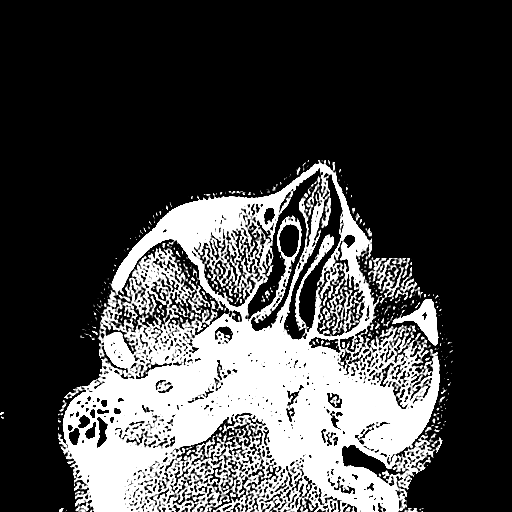
[im 33/58  bone]
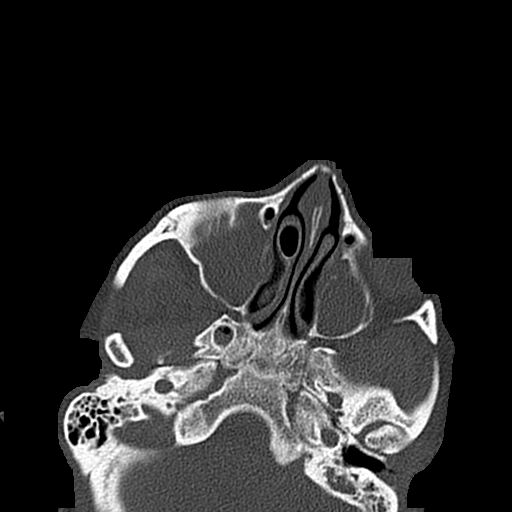
[im 37/58  bone]
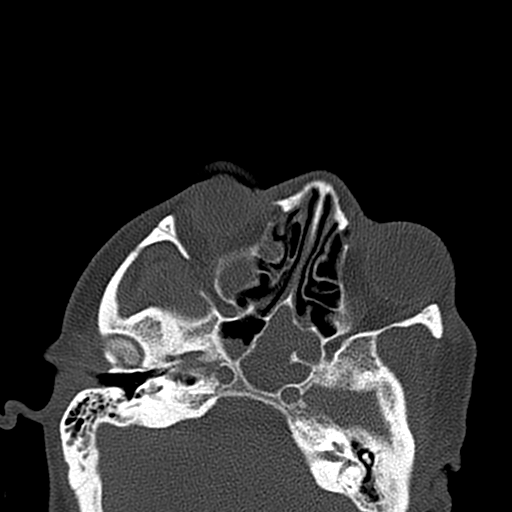
[im 45/58  bone]
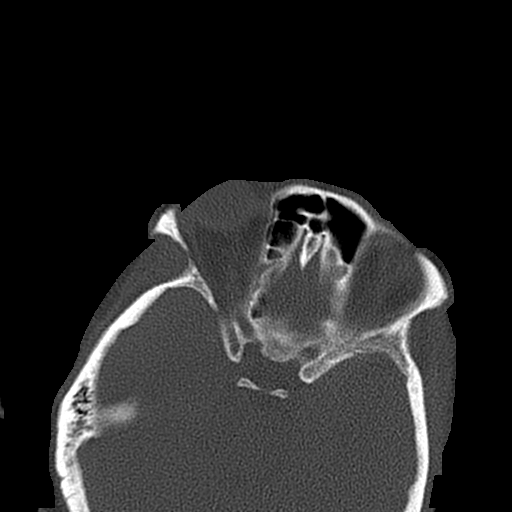
[im 53/58  bone]
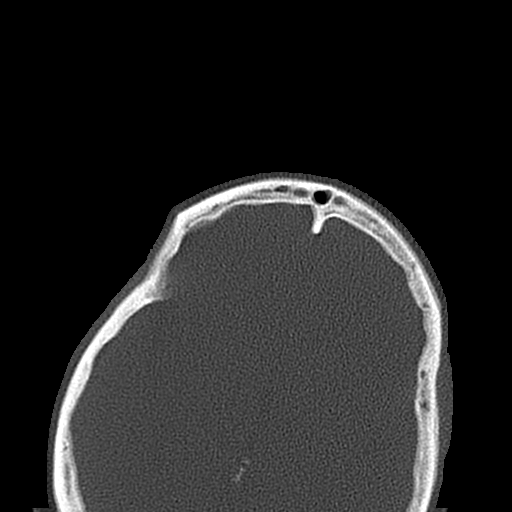

[Series 6: sinus 2.0 mpr cor · coronal · 0.26mm/px · 3 of 70 slices shown]
[im 24/70  bone]
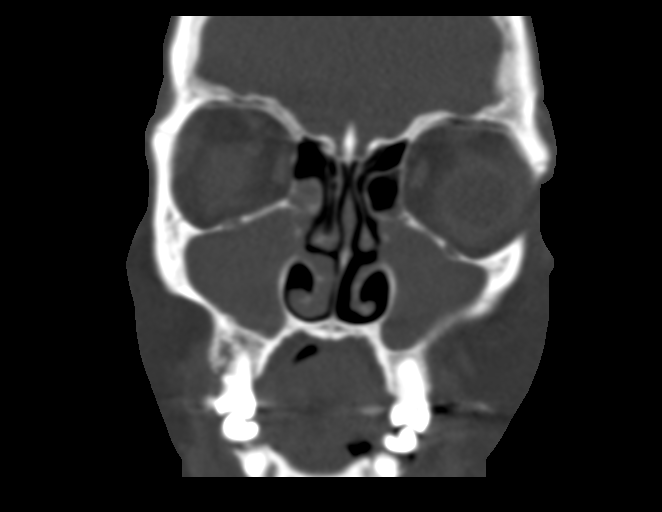
[im 31/70  bone]
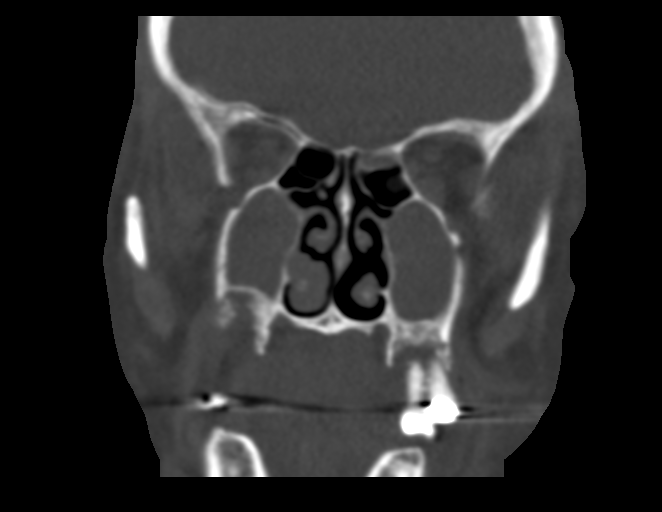
[im 39/70  bone]
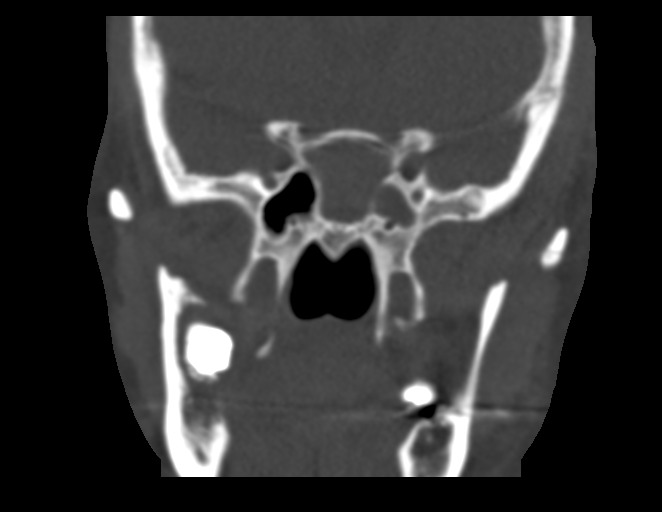

[Series 7: sinus 2.0 mpr sag · sagittal · 0.28mm/px · 3 of 75 slices shown]
[im 25/75  bone]
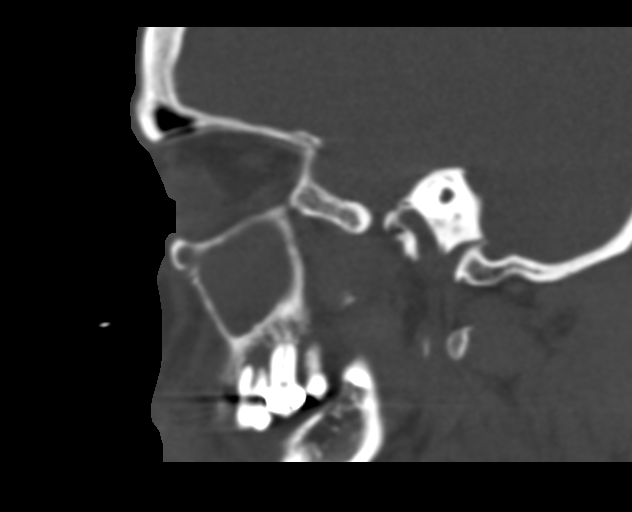
[im 38/75  bone]
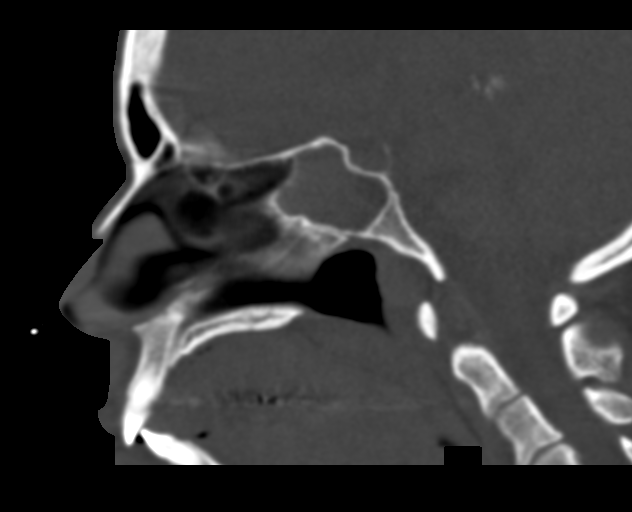
[im 50/75  bone]
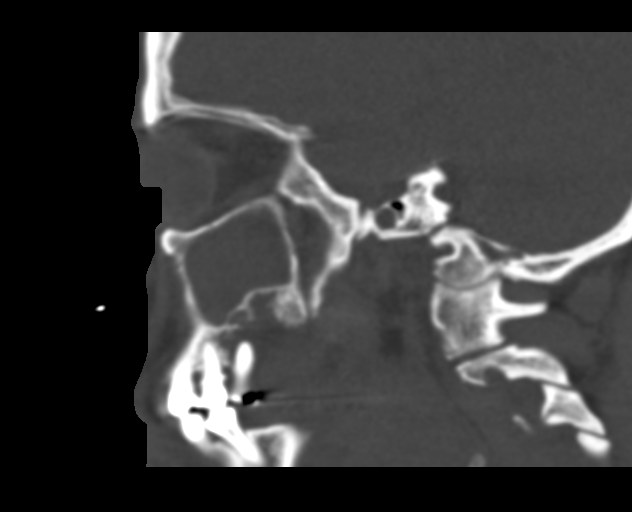

[14 of 47 positions shown; findings below may reference images not displayed]

FINDINGS: Paranasal sinuses:

Frontal: Clear bilaterally with interval resolution of left frontal
sinus opacification.

Ethmoid: Mild mucosal thickening bilaterally, greatly improved on
the left.

Maxillary: Progressive complete maxillary sinus opacification
bilaterally.

Sphenoid: Persistent complete left sphenoid sinus opacification.
Unchanged moderate volume fluid in the right sphenoid sinus.

Right ostiomeatal unit: Opacification of the maxillary sinus ostium
and ethmoid infundibulum.

Left ostiomeatal unit: Opacification of the maxillary sinus ostium
and ethmoid infundibulum.

Nasal passages: Asymmetric right inferior nasal turbinate
hypertrophy, unchanged. Right middle turbinate concha bullosa. 3 mm
leftward deviation of an intact nasal septum.

Anatomy: No pneumatization superior to anterior ethmoid notches.
Sellar sphenoid pneumatization pattern. No dehiscence of carotid or
optic canals. No onodi cell.

Other: Poor dentition is again noted with prominent periapical
erosions involving bilateral maxillary and mandibular molar teeth.
Partially visualized mastoid effusions. Limited assessment of the
brain due to streak and motion artifact. Persistent mild left facial
soft tissue swelling. No postseptal orbital inflammation.
IMPRESSION: 1. Progressive complete bilateral maxillary sinus opacification.
2. Unchanged complete left sphenoid sinus opacification and right
sphenoid sinus fluid.
3. Improved aeration of the left frontal and ethmoid sinuses.
4. Persistent mild left facial soft tissue swelling.

## 2020-10-13 MED ORDER — BACLOFEN 1 MG/ML ORAL SUSPENSION
7.5000 mg | ORAL | Status: DC
  Administered 2020-10-14 – 2020-11-17 (×35): 7.5 mg
  Filled 2020-10-13 (×39): qty 7.5

## 2020-10-13 MED ORDER — BACLOFEN 1 MG/ML ORAL SUSPENSION
5.0000 mg | ORAL | Status: DC
  Administered 2020-10-13 – 2020-11-16 (×64): 5 mg
  Filled 2020-10-13 (×85): qty 5

## 2020-10-13 MED ORDER — BACLOFEN 5 MG HALF TABLET: 5.0000 mg | ORAL_TABLET | ORAL | Status: DC

## 2020-10-13 NOTE — Progress Notes (Addendum)
[facility] PROGRESS NOTE  Charlene [name] DGU:[id number] DOB: Jul 27, 1965 DOA: 08/26/2020 PCP: Patient, [name]    October 08, 2020   Status: Remains inpatient appropriate because:Altered mental status, Unsafe d/c plan, IV treatments appropriate due to intensity of illness or inability to take PO and Inpatient level of care appropriate due to severity of illness   Dispo: The patient is from: Home              Anticipated d/c is to: SNF-family does not wish to place patient in SNF.  If this is the only option they would like to consider taking the patient home although this may be a difficult undertaking given current requirement for trach and understandable fears about managing such complex patient in the home environment.  They have been updated as to why this patient currently is not a candidate for aggressive rehabilitative therapies like would be provided at a brain injury recovery rehab.  **3/24 Husband clarified that he has not delegated POA status to other family members.  **3/25 LCSW called patient's husband at home.  He confirmed that he is okay with daughter managing the Medicaid and disability process and has agreed to change the address to the one requested by the daughter.  Also a letter was requested by daughter to clarify this process and this has been placed in the chart.              Patient currently is not medically stable to d/c.   Difficult to place patient Yes   Level of care: Progressive  Code Status: Full Family Communication: 3/21 and on 3/22; extensive conversation via telephone with patient's daughter Charlene [name] [phone]); husband Charlene [name] [phone]) updated via phone on 3/24; mother at bedside on 3/25 DVT prophylaxis: SCDs Vaccination status: Unknown  Micro Data:  2/9 COVID + Influenza negative 2/10 MRSA PCR negative 2/12 BCx 2 > no growth  2/19 BC 1/2 +Stap EPI, 1/2 no growth  10/07/2020 sputum positive for Serratia marcescens  Antimicrobials:   Cefepime 2/12 > 2/15 Vanc 2/12 > 2/15 10/08/2020 ciprofloxacin>>  Foley catheter:  No-Purewick  HPI: 55 yo F with 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 Subsequently on 2/10, she developed obtundation and respiratory arrest leading to intubation. She has remained persistently obtunded, now has a trach and a PEG. Hospital course complicated by persistent fever,Serratia pneumoniaand now pre-orbital cellulitis  As of 3/25 with adjustments and hypertonicity medications patient seems to be improving.  She is also waking up more although limited interaction but both eyes are now open and she tracks with the left eye.  Multiple discussions with husband who has confirmed he has not delegated other family members to be POA and he is the primary legal contact for patient.  He has agreed to let patient's daughter take the lead on completing and following up on Medicaid and disability applications including having these applications sent to her address.  2/09 Admit for [place] 2/10 EVD placed, later intubated due to obtundation and respiratory arrest 2/11 MRI Brain, hypertonic saline d/c'd, EVD not working. Husband did not consent to PICC  2/13 EVD removed 2/17 [place] discussion with family, PMT 2/24 Bedside trach and PEG 3/1 Transferred to [facility]  Subjective: Awake and eyes are open.  Purposeful tracking with left eye.  Some purposeful upper extremity movement.  Nonverbal.  Mother at bedside and all questions answered.  Objective: Vitals:   10/13/20 0553 10/13/20 0740  BP: 106/66  Pulse: 80 84  Resp: 18 11  Temp:    SpO2: 99% 100%    Intake/Output Summary (Last 24 hours) at 10/13/2020 0808 Last data filed at 10/13/2020 2637 Gross per 24 hour  Intake 2045.67 ml  Output 1850 ml  Net 195.67 ml   Filed Weights   10/11/20 0344 10/12/20 0405 10/13/20 0408  Weight: 67.7 kg 67.5 kg 66.6 kg     Exam:  Constitutional: Awake, no acute distress Respiratory: #6.0 cuffless trach, trach collar with FiO2 21% at 5L, anterior lung sounds are clear and there is no secretions noted at trach site.  O2 sats 98 to 100%. Cardiovascular: Normal heart sounds.  No tachycardia.  Blood pressure well controlled. Abdomen:  PEG tube for continuous tube feedings LBM 3/24  Musculoskeletal: Torticollis with associated left neck deviation markedly improved with patient now noted with head at midline gaze.  Family sitting on right side of bed to encourage patient to look towards the right.  Marked improvement in hypertonicity although with full extension of left arm there remains some tightness but does not result in rebound spasticity.  Patient with purposeful movement right upper extremity without any evidence of hypertonicity. Neurologic: CN 2-12 grossly intact although unable to manage oral secretions, both eyes are open but tracks only with left eye.  Some gross motor semipurposeful movement of right upper extremity.  Previous [place] flexion of both hands has significantly improved especially on the right hand as hypertonicity has improved.  Lower extremities unremarkable and splint boots are in place. Psychiatric: Awake and nonverbal.  Unable to follow any commands.  Unable to accurately assess orientation.  Does not appear to be having any hallucinatory type behaviors.   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 Intraventricular penetration with blood filling the 3rd and forth ventricles and nearly filling the right lateral ventricle.  -MRI 2/11 with intraparenchymal hemorrhage centered within the right thalamus and basal ganglia extending into the right midbrain with intraventricular  extension. Effacement of prepontine cistern and similar 4 mm of leftward midline shift. Separate areas of ristricted diffusion and edema involving the right greater than left mesial temporal lobes / parahippocampal cortex, bilateral basal ganglia, and possibly the upper pons that are concerning for acute infarcts, most likely 2/2 mass effect from the hemorrhage (see report)/ Per neuro "scattered b/l hippocampus, posterior CC, bilateral thalamus, and upper pons, likely due to deep penetrating vessel compression by focal mass-effect".  -2/19 MRI with acute to early subacute infarcts in the bilateral cerebral white matter and right cerebellum, new from 2/11 - interval evolution of multiple other infarcts present on prior MRI, increased hemorrhage along old right EVD tract (see report) - 2/21 head CT with expected evolution of ICH since 2/13 - Echo with normal EF, no intracardiac source of embolism - CTA head/neck without intracranial arterial occlusion or high grade stenosis, no dissection, aneurysm, or hemodynamically significant stenosis of the carotid or vertebral arteries - Head CT 3/9 with expected evolution of hemorrhage involving the right thalamus, basal ganglia, and adjacent white matter from prior exam with developing encephalomalacia.  Resolved previous intraventricular hemorrhage.  Persistent sulcal effacement with mild improvement.  Persistent 3 mm left to right midline shift.  Multiple areas of low attenuation consistent with infarcts within the central white matter, unchanged in distribution from prior.  (see report) - Per neurology- see last  follow up note from 2/25 - [place] likely 2/2 hypertension and nonadherence to meds  - s/p trach/peg on 2/24  - no antithrombotic 2/2 ICH -Obstructive hydrocephalus -> s/p external ventricular catheter placement on 08/27/20 by neurosurgery - EVD clamped on 2/12 and d/c'd on 2/13 -EEG suggestive of cortical dysfunction in right hemisphere likely 2/2  underlying bleed as well as severe diffuse encephalopathy, nonspecific etiology . -3/25 increased baclofen 5 mg to TID-3/29 we will increase baclofen dosing at bedtime to 7.5 mg and continue to follow LFTs.  Please note that patient has some stable baseline nonobstructive transaminitis. -3/22 EEG no seizures and persistent encephalopathy -Prognosis remains guarded and it is highly doubtful she will return to previous level of functioning therefore financial counseling notified to pursue disability application -0/34 tachycardia has resolved. Provigil initiated.  Persistent pansinusitis/recurrent fevers -Patient continues to have yellow-green drainage likely from sinuses -Has completed antibiotics recently for bilateral orbital cellulitis and is currently on Cipro for tracheal aspirate positive for Serratia marcescens with last dose due 3/31 -3/28 axillary temp of 99.8 which correlates with an oral temperature of 100.8.-CRP slightly elevated at 1.4.  Does not have leukocytosis or left shift but is currently on antibiotics for tracheobronchitis. -3/29 repeat sinus to CT to better characterize-may need to d/w ID regarding antibiotic selection and duration of treatment if persistent sinusitis noted-rationale for this procedure explained to patient's mother.  Depending on findings we may need to adjust current antibiotics and/or add on antibiotics and she may require a more prolonged duration of therapy.  Mother aware I will discuss with ID to get better clarification on appropriate treatment given she has been on multiple antibiotics during the hospitalization.  Neck Contracture/torticollis -Preference for keeping head turned to the left. -Continue baclofen as above -- continue PT/OT  Hypertensive emergency/Malignant HTN Resolved -Home medications include Coreg and HCTZ which have not been reordered -Continue metoprolol, hydralazine  and amlodipine -Blood pressure much better controlled after  hypertonicity and pain treated  Recurrent Fever  Serratia Pneumonia  Hospital Acquired Pneumonia:  -Tracheal aspirate from 2/25 and 3/1 with serratia - cefepime 2/27-3/5 -There is also concern for central fever, s/p buspirone - s/p treatment for periorbital cellulitis as noted below -TM 102 3/17-CXR unremarkable-tracheal aspirate 3/21 w/o WBCs but abundant GPCs, GNRs and GPCs on Gram stain  -Sputum culture positive for Serratia marcescens.  Started on Cipro on 3/24 with last dose due 3/31  Acute hypoxemic respiratory failure secondary to CVA requiring mechanical ventilation and tracheostomy tube -Stable on trach collar and has been transitioned to a #6 cuffless trach -Has significant altered mentation and inability to manage oral secretions at present time is not a safe candidate for decannulation.  Discussed this with family as major obstacle in discharging to home.  This will also decrease options for SNF placement. -Appreciate trach team assistance in management  Bacterial Conjunctivitis/periorbital cellulitis - MRI 3/7 orbits with L periorbital cellulitis  -- CT head 3/9 with persistent subtotal opacification of paranasal sinuses and mastoid air cells, bubbly lucency at base of R maxillary sinus (likely odontogenic in origin) - CT maxillofacial 3/12 - improving cellulitis  - s/p erythromycin ointment - completed course of Ancef   Pain likely related to underlying hypertonicity -Consider adding low-dose oxycodone short acting 2.5 mg every 12 hours.  Will first monitor for response to increase in baclofen on 3/21 -Morphine IV and Tylenol prn (watch elev LFTs) -Treat underlying hypertonicity with skeletal muscle relaxers and uptitrate as tolerated with goal to improve  and resolve hypertonicity without contributing to oversedation  Dysphagia/Nutrition Status: Nutrition Problem: Inadequate oral intake Etiology: inability to eat Signs/Symptoms: NPO status Interventions: Tube feeding  consisting of Osmolite 1.2 Cal at 55 cc/h; continue Prosource liquid 45 cc daily, free water 100 cc every 4 hours per tube; will also continue to follow CBGs regularly while on tube feedings and provide SSI Estimated body mass index is 24.43 kg/m as calculated from the following:   Height as of this encounter: 5' 5" (1.651 m).   Weight as of this encounter: 66.6 kg. If patient eventually discharged to home to teach family how to bolus feed as opposed to utilizing continuous tube feeding infusions.   Other problems: Anasarca -bilateral upper extremity edema, minimal LE edema - suspect related to prolonged hospitalization, hypoalbuminemia -upper extremity US-> age indeterminate SVT of right cephalic vein  Elevated LFT's - Remain elevated but stable without any obstructive pattern -Continue baclofen and statin  Leukocytosis -Resolved on antibiotics  Anemia -Stable, follow  Sinus Tachycardia Better on BB and after treatment of pain/hypertonicity Echo with EF 60-65% (see report)   Data Reviewed: Basic Metabolic Panel: Recent Labs  Lab 10/10/20 0156 10/12/20 1226  NA 140 140  K 3.7 4.2  CL 102 103  CO2 31 29  GLUCOSE 134* 83  BUN 21* 22*  CREATININE 0.70 0.79  CALCIUM 9.3 9.6   Liver Function Tests: Recent Labs  Lab 10/10/20 0156 10/12/20 1226  AST 56* 65*  ALT 97* 104*  ALKPHOS 89 105  BILITOT 0.4 0.4  PROT 6.9 7.3  ALBUMIN 2.5* 2.8*   No results for input(s): LIPASE, AMYLASE in the last 168 hours. No results for input(s): AMMONIA in the last 168 hours. CBC: Recent Labs  Lab 10/12/20 1226  WBC 7.6  NEUTROABS 4.8  HGB 9.9*  HCT 32.2*  MCV 84.5  PLT 384   Cardiac Enzymes: No results for input(s): CKTOTAL, CKMB, CKMBINDEX, TROPONINI in the last 168 hours. BNP (last 3 results) No results for input(s): BNP in the last 8760 hours.  ProBNP (last 3 results) No results for input(s): PROBNP in the last 8760 hours.  CBG: Recent Labs  Lab  10/12/20 0756 10/12/20 1230 10/12/20 1634 10/12/20 1944 10/12/20 2317  GLUCAP 121* 74 149* 115* 131*    Recent Results (from the past 240 hour(s))  Culture, Respiratory w Gram Stain     Status: None   Collection Time: 10/05/20  1:12 PM   Specimen: Tracheal Aspirate; Respiratory  Result Value Ref Range Status   Specimen Description TRACHEAL ASPIRATE  Final   Special Requests NONE  Final   Gram Stain   Final    NO WBC SEEN ABUNDANT GRAM POSITIVE COCCI ABUNDANT GRAM NEGATIVE RODS MODERATE GRAM POSITIVE RODS Performed at [facility], [place] [address] [place],  [address]    Culture RARE SERRATIA MARCESCENS  Final   Report Status 10/07/2020 FINAL  Final   Organism ID, Bacteria SERRATIA MARCESCENS  Final      Susceptibility   Serratia marcescens - MIC*    CEFAZOLIN >=64 RESISTANT Resistant     CEFEPIME <=0.12 SENSITIVE Sensitive     CEFTAZIDIME <=1 SENSITIVE Sensitive     CEFTRIAXONE <=0.25 SENSITIVE Sensitive     CIPROFLOXACIN <=0.25 SENSITIVE Sensitive     GENTAMICIN <=1 SENSITIVE Sensitive     TRIMETH/SULFA <=20 SENSITIVE Sensitive     * RARE SERRATIA MARCESCENS     Studies: No results found.  Scheduled Meds: . amLODipine  5  mg Per Tube Daily  . atorvastatin  40 mg Per Tube Daily  . baclofen  5 mg Per Tube TID  . chlorhexidine gluconate (MEDLINE KIT)  15 mL Mouth Rinse BID  . Chlorhexidine Gluconate Cloth  6 each Topical Daily  . ciprofloxacin  500 mg Per Tube BID  . enoxaparin (LOVENOX) injection  40 mg Subcutaneous Q24H  . feeding supplement (PROSource TF)  45 mL Per Tube Daily  . free water  100 mL Per Tube Q4H  . hydrALAZINE  50 mg Per Tube Q8H  . insulin aspart  0-9 Units Subcutaneous Q4H  . mouth rinse  15 mL Mouth Rinse 10 times per day  . metoprolol tartrate  100 mg Per Tube BID  . modafinil  100 mg Per Tube Daily  . pantoprazole sodium  40 mg Per Tube BID  . scopolamine  1 patch Transdermal Q72H   Continuous Infusions: . sodium chloride  Stopped (09/28/20 1344)  . feeding supplement (OSMOLITE 1.2 CAL) 1,000 mL (10/12/20 1404)    Principal Problem:   ICH (intracerebral hemorrhage) (HCC) Active Problems:   Intraparenchymal hemorrhage of brain (HCC)   Acute hypoxemic respiratory failure (HCC)   Fever   Endotracheal tube present   Status post tracheostomy ([place])   Palliative care by specialist   Muscle hypertonicity   Oropharyngeal dysphagia   Brain injury with loss of consciousness ([place])   Poor prognosis   Consultants:  Palliative care  Pulm  Neurology   Procedures:  Tracheostomy  PEG tube  EEG  Echocardiogram  Antibiotics: Anti-infectives (From admission, onward)   Start     Dose/Rate Route Frequency Ordered Stop   10/08/20 0845  ciprofloxacin (CIPRO) tablet 500 mg        500 mg Per Tube 2 times daily 10/08/20 0754 10/15/20 0759   09/21/20 1545  ceFAZolin (ANCEF) IVPB 1 g/50 mL premix        1 g 100 mL/hr over 30 Minutes Intravenous Every 8 hours 09/21/20 1457 09/28/20 0647   09/20/20 0600  ceFAZolin (ANCEF) IVPB 1 g/50 mL premix  Status:  Discontinued        1 g 100 mL/hr over 30 Minutes Intravenous Every 8 hours 09/14/20 0903 09/14/20 0904   09/16/20 1300  vancomycin (VANCOREADY) IVPB 1250 mg/250 mL  Status:  Discontinued        1,250 mg 166.7 mL/hr over 90 Minutes Intravenous Every 24 hours 09/15/20 1232 09/17/20 0956   09/15/20 1230  vancomycin (VANCOREADY) IVPB 1250 mg/250 mL        1,250 mg 166.7 mL/hr over 90 Minutes Intravenous  Once 09/15/20 1131 09/15/20 1402   09/13/20 1400  ceFEPIme (MAXIPIME) 2 g in sodium chloride 0.9 % 100 mL IVPB        2 g 200 mL/hr over 30 Minutes Intravenous Every 8 hours 09/13/20 1341 09/19/20 2208   09/06/20 0600  vancomycin (VANCOREADY) IVPB 1250 mg/250 mL        1,250 mg 166.7 mL/hr over 90 Minutes Intravenous  Once 09/06/20 0541 09/06/20 0739   09/05/20 1400  ceFAZolin (ANCEF) IVPB 1 g/50 mL premix  Status:  Discontinued        1 g 100 mL/hr over 30  Minutes Intravenous Every 8 hours 09/05/20 1120 09/13/20 1341   08/30/20 0600  vancomycin (VANCOREADY) IVPB 500 mg/100 mL  Status:  Discontinued        500 mg 100 mL/hr over 60 Minutes Intravenous Every 12 hours 08/29/20 1554 09/01/20 1115  08/29/20 1700  ceFEPIme (MAXIPIME) 2 g in sodium chloride 0.9 % 100 mL IVPB  Status:  Discontinued        2 g 200 mL/hr over 30 Minutes Intravenous Every 12 hours 08/29/20 1554 09/01/20 1115   08/29/20 1645  vancomycin (VANCOREADY) IVPB 1250 mg/250 mL        1,250 mg 166.7 mL/hr over 90 Minutes Intravenous  Once 08/29/20 1554 08/29/20 1820       Time spent: 35 minutes    Erin Hearing ANP  [facility] 7 am - 330 pm/M-F for direct patient care and secure chat Please refer to Amion for contact info 47  days

## 2020-10-13 NOTE — Progress Notes (Signed)
NAME:  Charlene [name], MRN:  [id number], DOB:  [birth year], LOS: 47 ADMISSION DATE:  08/26/2020, CONSULTATION DATE:  2/10 REFERRING MD:  Dr. Roda Shutters, CHIEF COMPLAINT:  ICH   Brief History:  55 year old female admitted with hypertensive R thalamic/basal ganglia ICH. Resultant obstructive hydrocephalus prompting EVD placement 2/10. Subsequently, 2/10 she had acute mental status change to obtundation and respiratory arrest resulting in intubation. Trach and Peg 2/24. Trach change to #6 cuffless on 3/11  Past Medical History:  HTN, medication noncompliance  Significant Hospital Events:  2/09 Admit for ICH 2/10 EVD placed, later intubated due to obtundation and respiratory arrest 2/11 MRI Brain, hypertonic saline d/c'd, EVD not working.  Husband did not consent to PICC  2/13 EVD removed 2/17 GOC discussion with family, PMT 3/11 trach changed to Shiley 6 uncuffed  Consults:  Stroke PCCM Neurosurgery  Procedures:  ETT 2/10 >> 09/10/2020 EVD 2/10 > non functioning 2/11 at 1500; clamped 2/12 am > 2/13 Trach and diagnostic Bronchoscopy 2/24>> Dr. Denese Killings PEG placement 2/24>> Dr. Sheliah Hatch  Significant Diagnostic Tests:  CT Head 2/9 >> Acute hemorrhage with the epicenter in the right thalamus. Measurement is approximately 3.2 x 2.1 x 2.5 cm (volume 8.8 cm^3). Intraventricular penetration with blood filling the third and fourth ventricles and nearly filling the right lateral ventricle. No hydrocephalus. CTA Head/Neck 2/10 >> Unchanged size of intraparenchymal hemorrhage centered in the right thalamus and basal ganglia with extension into the right lateral ventricle. 2. No intracranial arterial occlusion or high-grade stenosis. No dissection, aneurysm or hemodynamically significant stenosis of the carotid or vertebral arteries. CT Head 2/10 >> Stable parenchymal hemorrhage in the right thalamus and basal ganglia with intraventricular extension as described. No new focal abnormality is noted. CT Head  2/10 (post intubation) >> redemonstrated intraparenchymal hemorrhage centered within the R thalamus/basal ganglia extending into the cerebral peduncle/midbrain with associated intraventricular extension and hemorrhage in the R lateral ventricle and fourth ventricle, overall not significantly changed from prior; stable mass effect, 1mm of R to L midline shift, postsurgical changes of ventriculostomy TTE 2/10 >> LVEF 60 to 65%, LV has normal function, no regional wall motion abnormalities, mild concentric LVH.  RV systolic function is normal, normal pulmonary artery systolic pressure.  MRA head 2/11 >>Negative intracranial MR angiography. No evidence of aneurysm or high flow vascular malformation to explain the intraparenchymal hemorrhage  MRI brain 2/11 >> Similar intraparenchymal hemorrhage centered within the right thalamus and basal ganglia extending into the right midbrain with intraventricular extension, effacement of the prepontine cistern and similar 4 mm of leftward midline shift; separate areas of restricted diffusion and edema involving the R > L mesial temporal lobes/parahippocampal cortex, bilateral basal ganglia, and possibly the upper pons that are c/f acute infarcts, most likely secondary to mass effect from hemorrhage. MRI brain 2/19 >> intraventricular clot has decreased in size but mid-brain parenchymal clot has not and there are more ischemic infarcts in the surrounding brain.  CT Head WO Contrast 09/07/20>> Expected evolution of intracranial hemorrhage since 08/30/2020 head CT. Multiple areas of low attenuation corresponding to some of the infarcts seen on prior MRI. Ventricle caliber similar to prior MRI   Micro Data:  2/9 COVID + Influenza negative 2/10 MRSA PCR negative 2/12 BCx 2 > no growth  2/19 BC 1/2 +Stap EPI, 1/2 no growth  10/07/2020 sputum positive for Serratia marcescens  Antimicrobials:  Cefepime 2/12 > 2/15 Vanc 2/12 > 2/15 10/08/2020 ciprofloxacin>>  Interim  History / Subjective:  Tolerating trach collar.  Poor mental status precludes decannulation.  Objective   Blood pressure (!) 145/89, pulse 88, temperature 98.5 F (36.9 C), temperature source Axillary, resp. rate 20, height 5\' 5"  (1.651 m), weight 66.6 kg, last menstrual period 03/03/2014, SpO2 98 %, unknown if currently breastfeeding.    FiO2 (%):  [21 %] 21 %   Intake/Output Summary (Last 24 hours) at 10/13/2020 1008 Last data filed at 10/13/2020 10/15/2020 Gross per 24 hour  Intake 2045.67 ml  Output 1850 ml  Net 195.67 ml   Filed Weights   10/11/20 0344 10/12/20 0405 10/13/20 0408  Weight: 67.7 kg 67.5 kg 66.6 kg   General: [place] female no acute distress HEENT: #6 Shiley trach uncuffed with gray drainage noted Neuro: Does not follow commands CV: Sounds are regular PULM: Decreased breath sounds throughout GI: soft, bsx4 active  Extremities: warm/dry,  edema  Skin: no rashes or lesions     Resolved Hospital Problem list   Hypernatremia  Obstructive hydrocephalus (resolved) Hypertensive emergency Acute hypoxemic respiratory failure secondary to CVA Serratia PNA  Bacterial Conjunctivitis   Assessment & Plan:   10/15/20 dependent s/p Multiple acute strokes c/b Intraparenchymal hemorrhage with right thalamic hemorrhage with extension into the third fourth and right lateral ventricle Dysphagia s/p PEG Anemia    Active Pulm problem list  Trach dependent s/p Multiple acute strokes c/b Intraparenchymal hemorrhage with right thalamic hemorrhage with extension into the third fourth and right lateral ventricle  Plan: Continue Shiley trach as her mental status precludes protecting her airway and noted to have copious secretions..     Pulmonary critical care will follow twice a week for trach  [name] Tyrin Herbers ACNP Acute Care Nurse Practitioner [facility] Pulmonary/Critical Care Please consult Amion 10/13/2020, 10:08 AM

## 2020-10-13 NOTE — Progress Notes (Signed)
Physical Therapy Treatment Patient Details Name: Charlene [name] MRN: [id number] DOB: Jul 26, 1965 Today's Date: 10/13/2020    History of Present Illness Pt is 55 yo female presents to [facility] on 2/9 with L sided weakness and headaches. PMH includes anxiety, HTN. 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 s/p Right frontal ventricular catheter placement on 2/10, stopped functioning on 2/11.  MRI 2/11 shows right greater than left mesial temporal lobes / parahippocampal cortex, bilateral basal ganglia, and possibly the upper pons that are concerning for acute infarcts, most likely secondary to mass effect from the hemorrhage. ETT 2/10-present.  Pt with trach and PEG on 09/10/20. Hospital course complicated by persistent fever, Serratia pneumonia and now pre-orbital cellulitis.    PT Comments    Pt with both eyes opened more frequently today and seemed to be attempting some purposeful mvmts with R hand, reached between LE's after having a BM in sitting. Pt still not following commands. Maintained sitting x25 mins with  Min A during static sitting and mod/ max A due to posterior lean with facilitated mvmt of LE's/ UE's/ cervical spine. PT will continue to follow.   Follow Up Recommendations  SNF;Supervision/Assistance - 24 hour     Equipment Recommendations  Hospital bed    Recommendations for Other Services       Precautions / Restrictions Precautions Precautions: Fall Precaution Comments: peg and trach Restrictions Weight Bearing Restrictions: No    Mobility  Bed Mobility Overal bed mobility: Needs Assistance Bed Mobility: Rolling;Supine to Sit;Sit to Supine Rolling: Total assist   Supine to sit: Total assist;+2 for physical assistance Sit to supine: Total assist;+2 for physical assistance   General bed mobility comments: Tot A +2 for SL to sit  as well as return to SL. Pt rolled R and L for repositioning, not resistive to rolling. Maintains L head turn even with rolling R    Transfers                 General transfer comment: unable  Ambulation/Gait             General Gait Details: unable   Stairs             Wheelchair Mobility    Modified Rankin (Stroke Patients Only) Modified Rankin (Stroke Patients Only) Pre-Morbid Rankin Score: No symptoms Modified Rankin: Severe disability     Balance Overall balance assessment: Needs assistance Sitting-balance support: No upper extremity supported;Feet supported Sitting balance-Leahy Scale: Poor Sitting balance - Comments: Sat EOB x25 mins with pt needing only min A when sitting passively, mod/ max A needed during cervical stretching and UE/LE faciltated motion. Postural control: Posterior lean     Standing balance comment: unable                            Cognition Arousal/Alertness: Awake/alert Behavior During Therapy: Flat affect Overall Cognitive Status: Difficult to assess                                 General Comments: pt seems to be making some purposeful mvmts at times today. Reaching hand between legs after having BM.      Exercises General Exercises - Upper Extremity Elbow Flexion: 5 reps;Both;PROM Elbow Extension: PROM;Both;5 reps Wrist Flexion: PROM;Left;5 reps  Wrist Extension: PROM;Left;5 reps Composite Extension: PROM General Exercises - Lower Extremity Ankle Circles/Pumps: PROM;Both;10 reps;Seated Long Arc Quad: PROM;Both;5 reps;Seated Hip ABduction/ADduction: PROM;Supine (prolonged stretch to adductors) Other Exercises Other Exercises: passive stretch to L sided neck x5 mins    General Comments General comments (skin integrity, edema, etc.): hypertonicity noted RUE>LUE but pt seemed to be reaching at times. Pt with BM in sitting, rolled R and L in supine for clean up after return to supine       Pertinent Vitals/Pain Pain Assessment: Faces Faces Pain Scale: Hurts little more Pain Location: withdraws at times with UE ROM Pain Descriptors / Indicators: Discomfort Pain Intervention(s): Limited activity within patient's tolerance;Monitored during session    Home Living                      Prior Function            PT Goals (current goals can now be found in the care plan section) Acute Rehab PT Goals Patient Stated Goal: pt unable PT Goal Formulation: Patient unable to participate in goal setting Time For Goal Achievement: 10/20/20 Potential to Achieve Goals: Poor Progress towards PT goals: Progressing toward goals    Frequency    Min 1X/week      PT Plan Current plan remains appropriate    Co-evaluation              AM-PAC PT "6 Clicks" Mobility   Outcome Measure  Help needed turning from your back to your side while in a flat bed without using bedrails?: Total Help needed moving from lying on your back to sitting on the side of a flat bed without using bedrails?: Total Help needed moving to and from a bed to a chair (including a wheelchair)?: Total Help needed standing up from a chair using your arms (e.g., wheelchair or bedside chair)?: Total Help needed to walk in hospital room?: Total Help needed climbing 3-5 steps with a railing? : Total 6 Click Score: 6    End of Session Equipment Utilized During Treatment: Oxygen Activity Tolerance: Patient tolerated treatment well Patient left: in bed;with call bell/phone within reach;with bed alarm set;with SCD's reapplied Nurse Communication: Mobility status PT Visit Diagnosis: Hemiplegia and hemiparesis;Other symptoms and signs involving the nervous system (R29.898);Other abnormalities of gait and mobility (R26.89) Hemiplegia - Right/Left: Right Hemiplegia - dominant/non-dominant: Dominant Hemiplegia - caused by: Cerebral infarction;Nontraumatic intracerebral hemorrhage;Other Nontraumatic  intracranial hemorrhage     Time: 1135-1210 PT Time Calculation (min) (ACUTE ONLY): 35 min  Charges:  $Therapeutic Exercise: 8-22 mins $Therapeutic Activity: 8-22 mins                     Lyanne Co, PT  Acute Rehab Services  Pager [phone] Office [phone]    Lawana Chambers Marquell Saenz 10/13/2020, 1:30 PM

## 2020-10-13 NOTE — Plan of Care (Signed)
  Problem: Education: Goal: Knowledge of disease or condition will improve Outcome: Progressing   Problem: Self-Care: Goal: Ability to communicate needs accurately will improve Outcome: Progressing   Problem: Nutrition: Goal: Risk of aspiration will decrease Outcome: Progressing Goal: Dietary intake will improve Outcome: Progressing   Problem: Clinical Measurements: Goal: Will remain free from infection Outcome: Progressing   Problem: Nutrition: Goal: Adequate nutrition will be maintained Outcome: Progressing   Problem: Pain Managment: Goal: General experience of comfort will improve Outcome: Progressing

## 2020-10-14 ENCOUNTER — Inpatient Hospital Stay (HOSPITAL_COMMUNITY): Payer: Medicaid Other

## 2020-10-14 LAB — GLUCOSE, CAPILLARY
Glucose-Capillary: 128 mg/dL — ABNORMAL HIGH (ref 70–99)
Glucose-Capillary: 175 mg/dL — ABNORMAL HIGH (ref 70–99)
Glucose-Capillary: 121 mg/dL — ABNORMAL HIGH (ref 70–99)
Glucose-Capillary: 118 mg/dL — ABNORMAL HIGH (ref 70–99)
Glucose-Capillary: 114 mg/dL — ABNORMAL HIGH (ref 70–99)
Glucose-Capillary: 122 mg/dL — ABNORMAL HIGH (ref 70–99)
Glucose-Capillary: 167 mg/dL — ABNORMAL HIGH (ref 70–99)

## 2020-10-14 LAB — PROCALCITONIN: Procalcitonin: <0.1 ng/mL

## 2020-10-14 IMAGING — CT CT CHEST W/O CM
2 of 3 series · 15 of 36 positions shown, 18 images · non-contrast
Comparison: None.

CLINICAL DATA: Pneumonia.

EXAM:
CT CHEST WITHOUT CONTRAST
TECHNIQUE: Multidetector CT imaging of the chest was performed following the
standard protocol without IV contrast.

[Series 3: chest w/o 2mm st · axial · non-contrast · 0.70mm/px · z∈[+588,+798]mm · 12 of 125 slices shown, 15 images]
[im 10/125  mediastinal]
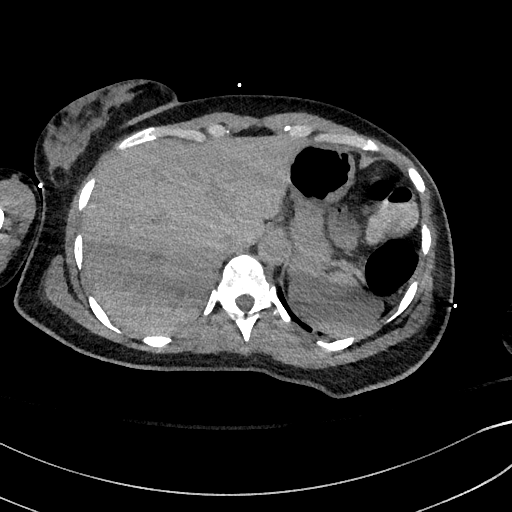
[im 10/125  lung]
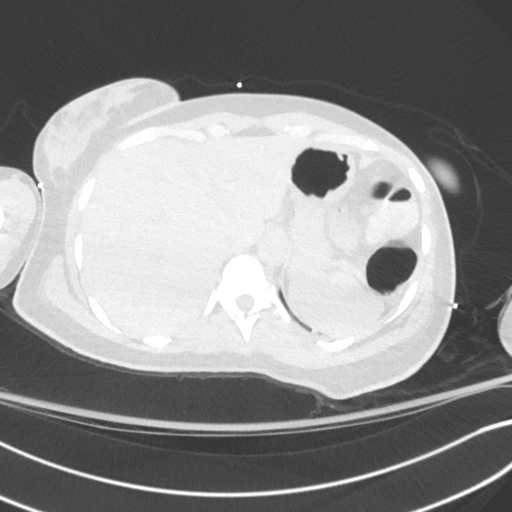
[im 19/125  lung]
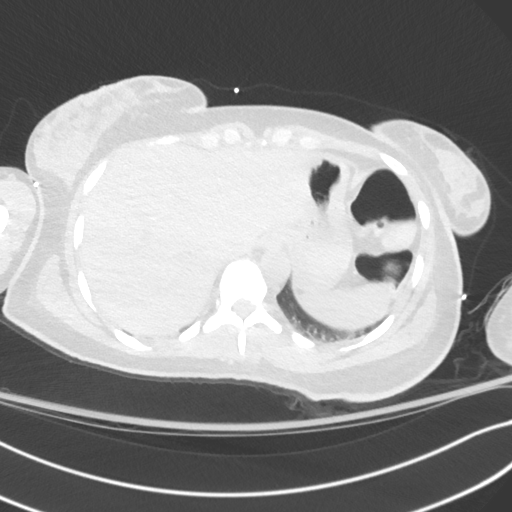
[im 28/125  lung]
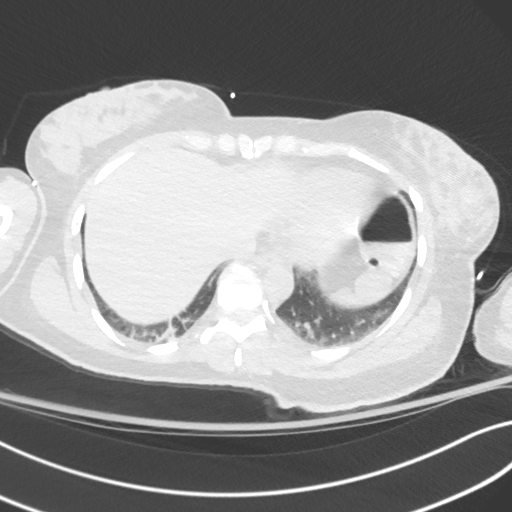
[im 37/125  lung]
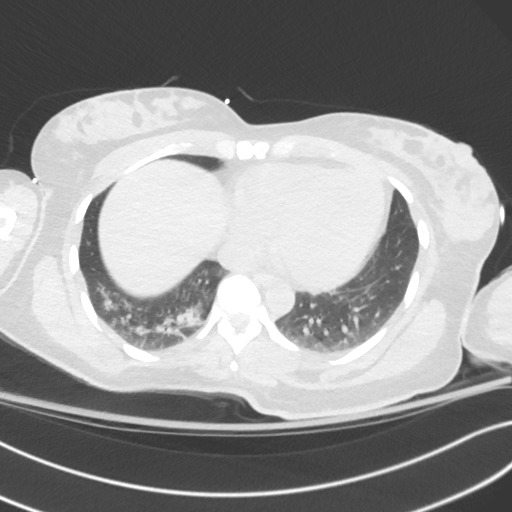
[im 46/125  mediastinal]
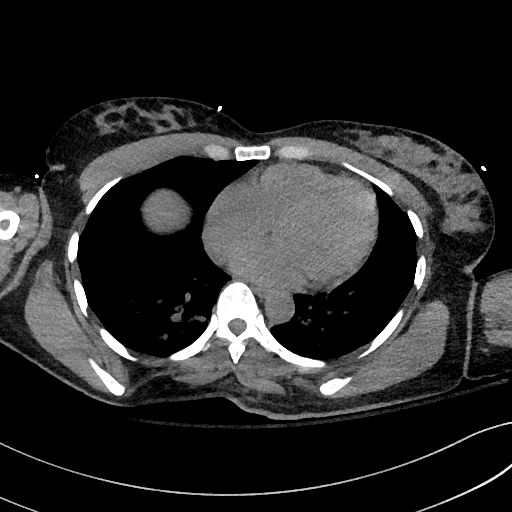
[im 46/125  lung]
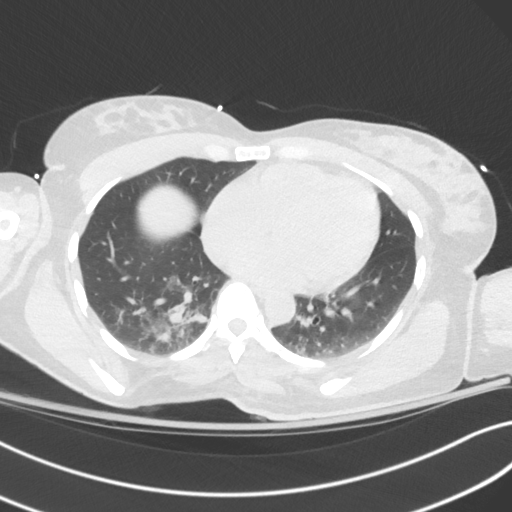
[im 56/125  lung]
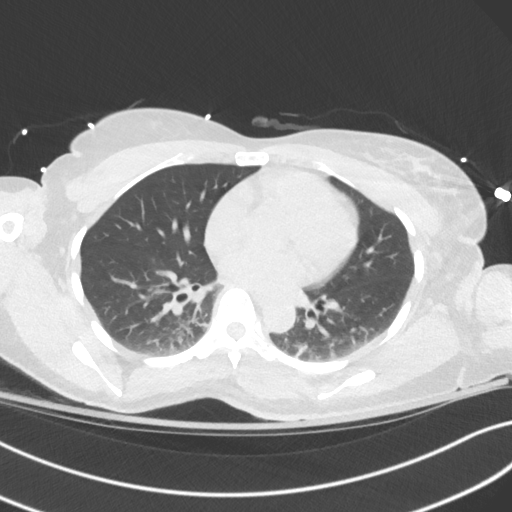
[im 69/125  lung]
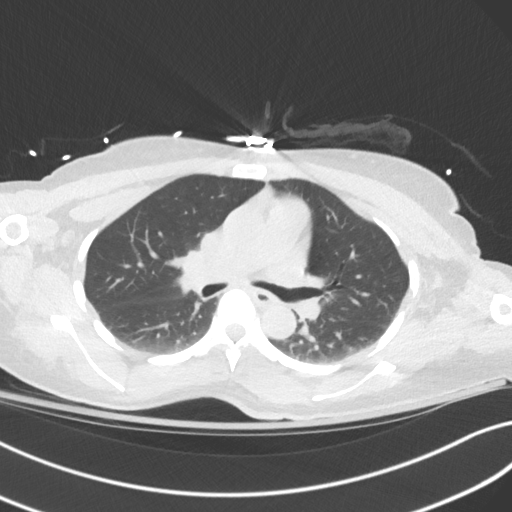
[im 79/125  lung]
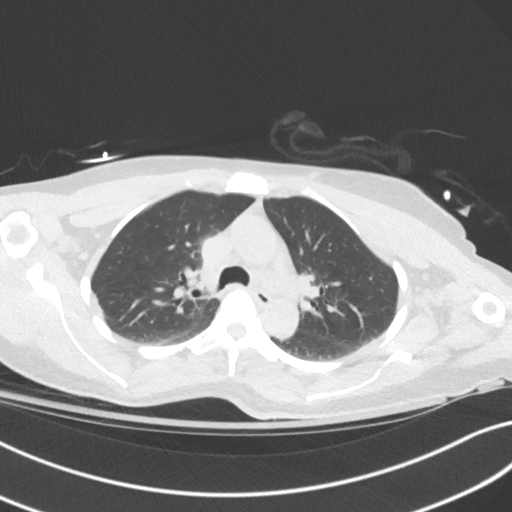
[im 88/125  mediastinal]
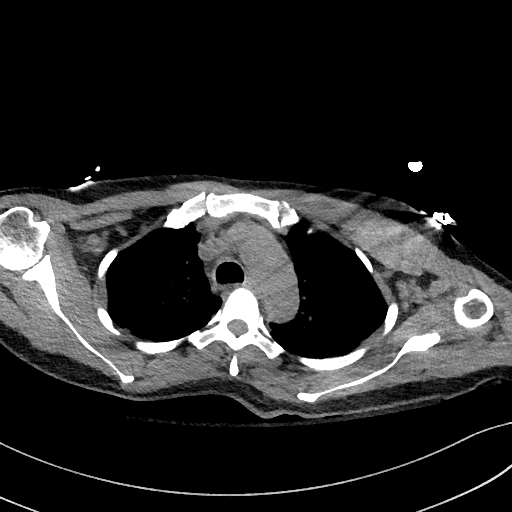
[im 88/125  lung]
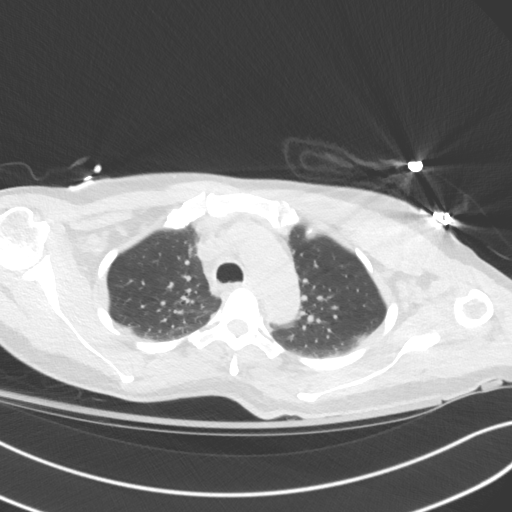
[im 97/125  lung]
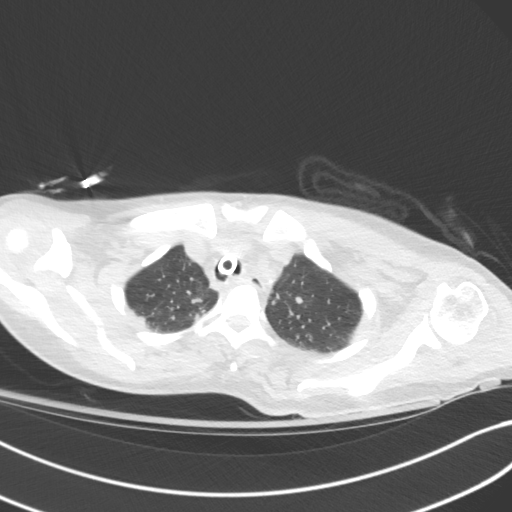
[im 106/125  lung]
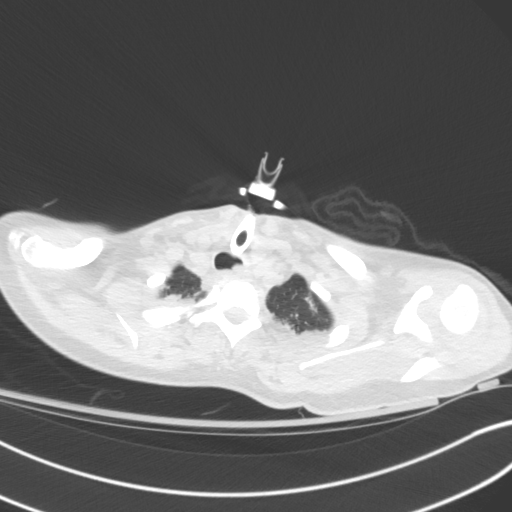
[im 115/125  lung]
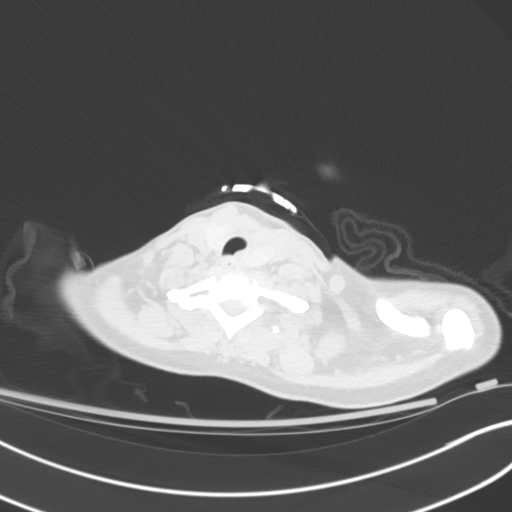

[Series 6: chest w/o 2mm st cor · coronal · non-contrast · 0.51mm/px · 3 of 151 slices shown]
[im 31/151  lung]
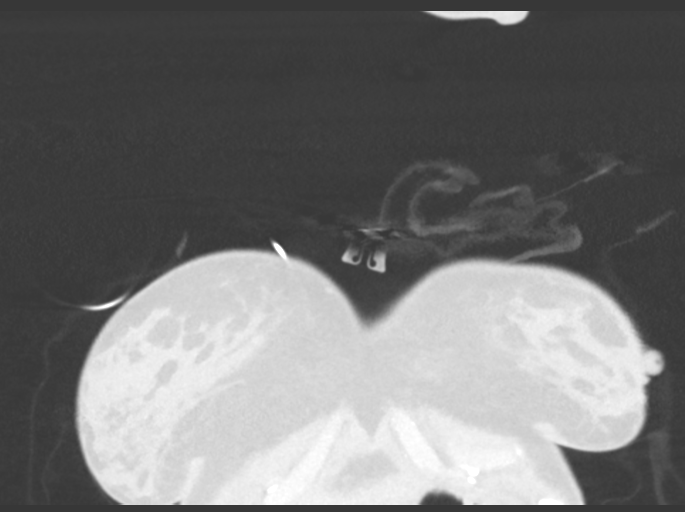
[im 61/151  lung]
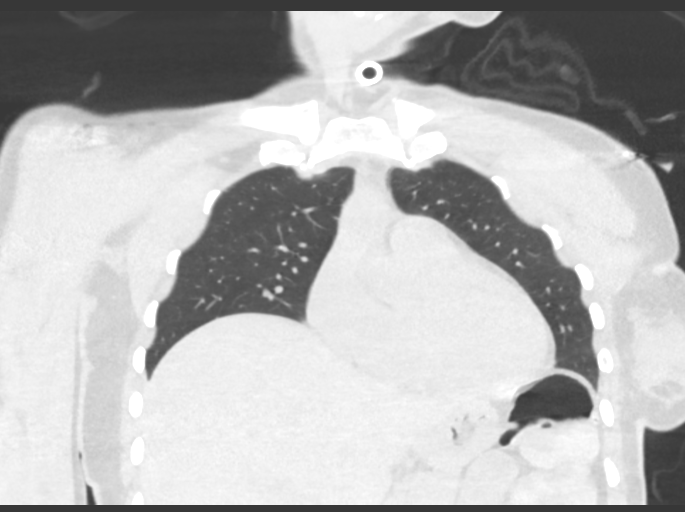
[im 91/151  lung]
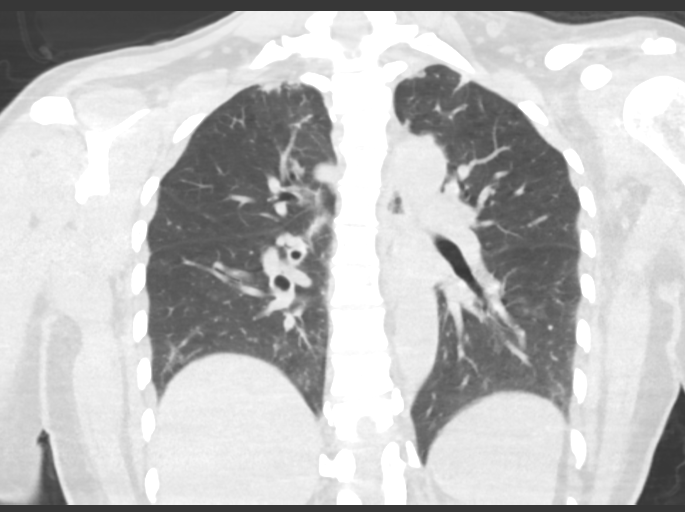

[15 of 36 positions shown; findings below may reference images not displayed]

FINDINGS: Cardiovascular: No significant vascular findings. Normal heart size.
No pericardial effusion.

Mediastinum/Nodes: Tracheostomy tube is in grossly good position.
The esophagus is unremarkable. No adenopathy is noted. Thyroid gland
is unremarkable.

Lungs/Pleura: No pneumothorax or pleural effusion is noted. Mild
biapical scarring is noted. Left lung is clear. There appears to be
aspirated material within the right lower lobe bronchi with
surrounding opacity concerning for aspiration pneumonia

Upper Abdomen: No acute abnormality.

Musculoskeletal: No chest wall mass or suspicious bone lesions
identified.
IMPRESSION: Probable aspirated material within the right lower lobe bronchi with
surrounding opacity in the right lower lobe concerning for
aspiration pneumonia.

## 2020-10-14 MED ORDER — SENNOSIDES 8.8 MG/5ML PO SYRP
5.0000 mL | ORAL_SOLUTION | Freq: Every day | ORAL | Status: DC
  Administered 2020-10-14 – 2020-11-25 (×41): 5 mL
  Filled 2020-10-14 (×45): qty 5

## 2020-10-14 MED ORDER — SODIUM CHLORIDE 0.9 % IV SOLN
3.0000 g | Freq: Four times a day (QID) | INTRAVENOUS | Status: AC
  Administered 2020-10-14 – 2020-10-19 (×20): 3 g via INTRAVENOUS
  Filled 2020-10-14 (×2): qty 3
  Filled 2020-10-14 (×3): qty 8
  Filled 2020-10-14 (×3): qty 3
  Filled 2020-10-14 (×2): qty 8
  Filled 2020-10-14 (×2): qty 3
  Filled 2020-10-14: qty 8
  Filled 2020-10-14 (×2): qty 3
  Filled 2020-10-14 (×2): qty 8
  Filled 2020-10-14: qty 3
  Filled 2020-10-14: qty 8
  Filled 2020-10-14: qty 3

## 2020-10-14 MED ORDER — OXYCODONE HCL 5 MG PO TABS
5.0000 mg | ORAL_TABLET | Freq: Four times a day (QID) | ORAL | Status: DC | PRN
  Administered 2020-10-18 – 2020-12-01 (×19): 5 mg
  Filled 2020-10-14 (×21): qty 1

## 2020-10-14 NOTE — Progress Notes (Signed)
Pharmacy Antibiotic Note  Charlene [name] is a 55 y.o. female admitted on 08/26/2020 with ICH with resultant hydrocephalus on 08/27/20; S/P trache/peg on 09/10/20.  Pt's hospital course has been complicated by persistent pansinusitis, Serratia pneumonia and peri-orbital cellulitis and recurrent fevers. Pt has been on ciprofloxacin from 3/24-3/30 and continues to have yellow-green drainage likely from sinuses. Pharmacy has been consulted for Unasyn dosing for aspiration pneumonia.  WBC 7.6, Tmax 99.7 F; Scr 0.79, CrCl 73.2 ml/min (renal function stable)  Plan: Unasyn 3 gm IV Q 6 hrs Monitor WBC, temp, clinical improvement, renal function  Height: 5\' 5"  (165.1 cm) Weight: 66.6 kg (146 lb 13.2 oz) IBW/kg (Calculated) : 57  Temp (24hrs), Avg:98.9 F (37.2 C), Min:98.3 F (36.8 C), Max:99.7 F (37.6 C)  Recent Labs  Lab 10/10/20 0156 10/12/20 1226  WBC  --  7.6  CREATININE 0.70 0.79    Estimated Creatinine Clearance: 73.2 mL/min (by C-G formula based on SCr of 0.79 mg/dL).    No Known Allergies  Antimicrobials this admission: See Epic, due to >1 month stay this admission Ciprofloxacin 3/24 > 3/30 Unasyn 3/30 >  Microbiology results: See Epic, due to >1 month stay this admission Most recent cx:  3/21 Trach aspirate: rare Serratia marcescens, suscept to cipro 3/18 Bld cx X 2: NG/final  Thank you for allowing pharmacy to be a part of this patient's care.  4/18, PharmD, BCPS, [facility] Clinical Pharmacist 10/14/2020 5:45 PM

## 2020-10-14 NOTE — Progress Notes (Addendum)
[facility] PROGRESS NOTE  Charlene [name] PXT:[id number] DOB: 07-13-1966 DOA: 08/26/2020 PCP: Patient, [name] (Inactive)    October 08, 2020   Status: Remains inpatient appropriate because:Altered mental status, Unsafe d/c plan, IV treatments appropriate due to intensity of illness or inability to take PO and Inpatient level of care appropriate due to severity of illness   Dispo: The patient is from: Home              Anticipated d/c is to: SNF-family does not wish to place patient in SNF.  If this is the only option they would like to consider taking the patient home although this may be a difficult undertaking given current requirement for trach and understandable fears about managing such complex patient in the home environment.  They have been updated as to why this patient currently is not a candidate for aggressive rehabilitative therapies like would be provided at a brain injury recovery rehab.  **3/24 Husband clarified that he has not delegated POA status to other family members.  **3/25 LCSW called patient's husband at home.  He confirmed that he is okay with daughter managing the Medicaid and disability process and has agreed to change the address to the one requested by the daughter.  Also a letter was requested by daughter to clarify this process and this has been placed in the chart.              Patient currently is not medically stable to d/c.   Difficult to place patient Yes   Level of care: Progressive  Code Status: Full Family Communication: 3/21 and on 3/22; extensive conversation via telephone with patient's daughter Charlene [name] [phone]); husband Charlene [name] [phone]) updated via phone on 3/24; mother at bedside on 3/25 DVT prophylaxis: SCDs Vaccination status: Unknown  Micro Data:  2/9 COVID + Influenza negative 2/10 MRSA PCR negative 2/12 BCx 2 > no growth  2/19 BC 1/2 +Stap EPI, 1/2 no growth  10/07/2020 sputum positive for Serratia  marcescens  Antimicrobials:  Cefepime 2/12 > 2/15 Vanc 2/12 > 2/15 10/08/2020 ciprofloxacin>>  Foley catheter:  No-Purewick  HPI: 55 yo F with 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 Subsequently on 2/10, she developed obtundation and respiratory arrest leading to intubation. She has remained persistently obtunded, now has a trach and a PEG. Hospital course complicated by persistent fever,Serratia pneumoniaand now pre-orbital cellulitis  As of 3/25 with adjustments and hypertonicity medications patient seems to be improving.  She is also waking up more although limited interaction but both eyes are now open and she tracks with the left eye.  Multiple discussions with husband who has confirmed he has not delegated other family members to be POA and he is the primary legal contact for patient.  He has agreed to let patient's daughter take the lead on completing and following up on Medicaid and disability applications including having these applications sent to her address.  2/09 Admit for [place] 2/10 EVD placed, later intubated due to obtundation and respiratory arrest 2/11 MRI Brain, hypertonic saline d/c'd, EVD not working. Husband did not consent to PICC  2/13 EVD removed 2/17 [place] discussion with family, PMT 2/24 Bedside trach and PEG 3/1 Transferred to [facility]  Subjective: No family at bedside.  Patient's eyes are open but she is not making any attempts to track with her eyes.  Continues with some subtle right upper extremity purposeful movement.  Objective: Vitals:   10/14/20 0315  10/14/20 0328  BP: (!) 139/107 135/80  Pulse: 82 83  Resp: 16 19  Temp: 99.7 F (37.6 C)   SpO2: 100% 99%    Intake/Output Summary (Last 24 hours) at 10/14/2020 0751 Last data filed at 10/14/2020 0353 Gross per 24 hour  Intake --  Output 1600 ml  Net -1600 ml   Filed Weights   10/12/20 0405 10/13/20 0408  10/14/20 0315  Weight: 67.5 kg 66.6 kg 66.6 kg    Exam:  Constitutional: Awake, no acute distress Respiratory: #6.0 cuffless trach, trach collar with FiO2 21% at 5L,  O2 sats 98 to 100%.  Anterior lung sounds are clear to auscultation and there is no tracheal drainage noted Cardiovascular:  Abdomen:  PEG tube for continuous tube feedings, abdomen is soft slightly distended and nontender LBM 3/24  Musculoskeletal: Torticollis with associated left neck deviation markedly improved with patient now noted with head at midline gaze.    Marked improvement in hypertonicity although with full extension of left arm there remains some tightness but does not result in rebound spasticity.   Neurologic: CN 2-12 grossly intact although unable to manage oral secretions, both eyes are open but tracks only with left eye.  Some gross motor semipurposeful movement of right upper extremity. Lower extremities unremarkable and splint boots are in place. Psychiatric: Awake, not tracking with left eye today.  Unable to accurately assess orientation given nonverbal state.   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 Intraventricular penetration with blood filling the 3rd and forth ventricles and nearly filling the right lateral ventricle.  -MRI 2/11 with intraparenchymal hemorrhage centered within the right thalamus and basal ganglia extending into the right midbrain with intraventricular extension. Effacement of prepontine cistern and similar 4 mm of leftward midline shift. Separate areas of ristricted diffusion and edema involving the right greater than left mesial temporal lobes / parahippocampal cortex, bilateral basal ganglia, and possibly the upper pons that are concerning for acute infarcts, most  likely 2/2 mass effect from the hemorrhage (see report)/ Per neuro "scattered b/l hippocampus, posterior CC, bilateral thalamus, and upper pons, likely due to deep penetrating vessel compression by focal mass-effect".  -2/19 MRI with acute to early subacute infarcts in the bilateral cerebral white matter and right cerebellum, new from 2/11 - interval evolution of multiple other infarcts present on prior MRI, increased hemorrhage along old right EVD tract (see report) - 2/21 head CT with expected evolution of ICH since 2/13 - Echo with normal EF, no intracardiac source of embolism - CTA head/neck without intracranial arterial occlusion or high grade stenosis, no dissection, aneurysm, or hemodynamically significant stenosis of the carotid or vertebral arteries - Head CT 3/9 with expected evolution of hemorrhage involving the right thalamus, basal ganglia, and adjacent white matter from prior exam with developing encephalomalacia.  Resolved previous intraventricular hemorrhage.  Persistent sulcal effacement with mild improvement.  Persistent 3 mm left to right midline shift.  Multiple areas of low attenuation consistent with infarcts within the central white matter, unchanged in distribution from prior.  (see report) - Per neurology- see last follow up note from 2/25 - [place] likely 2/2 hypertension and nonadherence to meds  - s/p trach/peg on 2/24  - no antithrombotic 2/2 ICH -Obstructive hydrocephalus -> s/p external ventricular catheter placement on 08/27/20 by neurosurgery - EVD clamped on 2/12 and d/c'd on 2/13 -  EEG suggestive of cortical dysfunction in right hemisphere likely 2/2 underlying bleed as well as severe diffuse encephalopathy, nonspecific etiology . -Continue baclofen 5 mg at 8 AM and 4 PM. -3/29 increased baclofen dosing at bedtime to 7.5 mg and continue to follow LFTs.  Please note that patient has some stable baseline nonobstructive transaminitis. -3/22 EEG no seizures and persistent  encephalopathy -Prognosis remains guarded and it is highly doubtful she will return to previous level of functioning therefore financial counseling notified to pursue disability application -Cont Provigil -Add scheduled Senokot at HS given requirement for SMRs   Persistent pansinusitis/recurrent fevers -Patient continues to have yellow-green drainage likely from sinuses -Has completed antibiotics recently for bilateral orbital cellulitis and is currently on Cipro for tracheal aspirate positive for Serratia marcescens with last dose due 3/31 -3/28 axillary temp of 99.8 which correlates with an oral temperature of 100.8.-CRP slightly elevated at 1.4.  Does not have leukocytosis or left shift but is currently on antibiotics for tracheobronchitis. -Repeat sinus CT demonstrates progressive complete bilateral maxillary sinus opacification with unchanged complete left sphenoid sinus opacification and right sphenoid sinus fluid. -Discussed with attending. PCT normal-plan check CT Chest to see if undetected pneumonia-last week CXR unremarkable  Neck Contracture/torticollis -Preference for keeping head turned to the left. -Continue baclofen as above -- continue PT/OT PT eval 3/30 Pt with both eyes opened more frequently today and seemed to be attempting some purposeful mvmts with R hand, reached between LE's after having a BM in sitting. Pt still not following commands. Maintained sitting x25 mins with  Min A during static sitting and mod/ max A due to posterior lean with facilitated mvmt of LE's/ UE's/ cervical spine. PT will continue to follow.  Hypertensive emergency/Malignant HTN Resolved -Home medications include Coreg and HCTZ which have not been reordered -Continue metoprolol, hydralazine  and amlodipine -Blood pressure much better controlled after hypertonicity and pain treated  Recurrent Fever  Serratia Pneumonia  Hospital Acquired Pneumonia:  -Tracheal aspirate from 2/25 and 3/1 with  serratia - cefepime 2/27-3/5 -There is also concern for central fever, s/p buspirone - s/p treatment for periorbital cellulitis as noted below -TM 102 3/17-CXR unremarkable-tracheal aspirate 3/21 w/o WBCs but abundant GPCs, GNRs and GPCs on Gram stain  -Sputum culture positive for Serratia marcescens.  Started on Cipro on 3/24 with last dose due 3/31  Acute hypoxemic respiratory failure secondary to CVA requiring mechanical ventilation and tracheostomy tube -Stable on trach collar and has been transitioned to a #6 cuffless trach -Has significant altered mentation and inability to manage oral secretions at present time is not a safe candidate for decannulation.  Discussed this with family as major obstacle in discharging to home.  This will also decrease options for SNF placement. -Appreciate trach team assistance in management  Bacterial Conjunctivitis/periorbital cellulitis - MRI 3/7 orbits with L periorbital cellulitis  -- CT head 3/9 with persistent subtotal opacification of paranasal sinuses and mastoid air cells, bubbly lucency at base of R maxillary sinus (likely odontogenic in origin) - CT maxillofacial 3/12 - improving cellulitis  - s/p erythromycin ointment - completed course of Ancef   Pain likely related to underlying hypertonicity -Morphine IV and Tylenol prn (watch elev LFTs) -Treat underlying hypertonicity with skeletal muscle relaxers and uptitrate as tolerated with goal to improve and resolve hypertonicity without contributing to oversedation  Dysphagia/Nutrition Status: Nutrition Problem: Inadequate oral intake Etiology: inability to eat Signs/Symptoms: NPO status Interventions: Tube feeding consisting of Osmolite 1.2 Cal at 55 cc/h; continue Prosource liquid  45 cc daily, free water 100 cc every 4 hours per tube; will also continue to follow CBGs regularly while on tube feedings and provide SSI Estimated body mass index is 24.43 kg/m as calculated from the following:    Height as of this encounter: _0  (1.651 m).   Weight as of this encounter: 66.6 kg. If patient eventually discharged to home to teach family how to bolus feed as opposed to utilizing continuous tube feeding infusions.   Other problems: Anasarca -bilateral upper extremity edema, minimal LE edema - suspect related to prolonged hospitalization, hypoalbuminemia -upper extremity US-> age indeterminate SVT of right cephalic vein  Elevated LFT's - Remain elevated but stable without any obstructive pattern -Continue baclofen and statin  Leukocytosis -Resolved on antibiotics  Anemia -Stable, follow  Sinus Tachycardia Better on BB and after treatment of pain/hypertonicity Echo with EF 60-65% (see report)   Data Reviewed: Basic Metabolic Panel: Recent Labs  Lab 10/10/20 0156 10/12/20 1226  NA 140 140  K 3.7 4.2  CL 102 103  CO2 31 29  GLUCOSE 134* 83  BUN 21* 22*  CREATININE 0.70 0.79  CALCIUM 9.3 9.6   Liver Function Tests: Recent Labs  Lab 10/10/20 0156 10/12/20 1226  AST 56* 65*  ALT 97* 104*  ALKPHOS 89 105  BILITOT 0.4 0.4  PROT 6.9 7.3  ALBUMIN 2.5* 2.8*   No results for input(s): LIPASE, AMYLASE in the last 168 hours. No results for input(s): AMMONIA in the last 168 hours. CBC: Recent Labs  Lab 10/12/20 1226  WBC 7.6  NEUTROABS 4.8  HGB 9.9*  HCT 32.2*  MCV 84.5  PLT 384   Cardiac Enzymes: No results for input(s): CKTOTAL, CKMB, CKMBINDEX, TROPONINI in the last 168 hours. BNP (last 3 results) No results for input(s): BNP in the last 8760 hours.  ProBNP (last 3 results) No results for input(s): PROBNP in the last 8760 hours.  CBG: Recent Labs  Lab 10/13/20 1130 10/13/20 1633 10/13/20 1919 10/13/20 2322 10/14/20 0313  GLUCAP 131* 116* 130* 142* 128*    Recent Results (from the past 240 hour(s))  Culture, Respiratory w Gram Stain     Status: None   Collection Time: 10/05/20  1:12 PM   Specimen: Tracheal Aspirate; Respiratory   Result Value Ref Range Status   Specimen Description TRACHEAL ASPIRATE  Final   Special Requests NONE  Final   Gram Stain   Final    NO WBC SEEN ABUNDANT GRAM POSITIVE COCCI ABUNDANT GRAM NEGATIVE RODS MODERATE GRAM POSITIVE RODS Performed at [facility], [place] [address], [place], [place] [address]    Culture RARE SERRATIA MARCESCENS  Final   Report Status 10/07/2020 FINAL  Final   Organism ID, Bacteria SERRATIA MARCESCENS  Final      Susceptibility   Serratia marcescens - MIC*    CEFAZOLIN >=64 RESISTANT Resistant     CEFEPIME <=0.12 SENSITIVE Sensitive     CEFTAZIDIME <=1 SENSITIVE Sensitive     CEFTRIAXONE <=0.25 SENSITIVE Sensitive     CIPROFLOXACIN <=0.25 SENSITIVE Sensitive     GENTAMICIN <=1 SENSITIVE Sensitive     TRIMETH/SULFA <=20 SENSITIVE Sensitive     * RARE SERRATIA MARCESCENS     Studies: CT MAXILLOFACIAL  LTD WO CM  Result Date: 10/13/2020 CLINICAL DATA:  Sinusitis.  Persistent purulent drainage. EXAM: CT PARANASAL SINUS LIMITED WITHOUT CONTRAST TECHNIQUE: Multidetector CT images of the paranasal sinuses were obtained using the standard protocol without intravenous contrast. COMPARISON:  09/26/2020 FINDINGS:  Paranasal sinuses: Frontal: Clear bilaterally with interval resolution of left frontal sinus opacification. Ethmoid: Mild mucosal thickening bilaterally, greatly improved on the left. Maxillary: Progressive complete maxillary sinus opacification bilaterally. Sphenoid: Persistent complete left sphenoid sinus opacification. Unchanged moderate volume fluid in the right sphenoid sinus. Right ostiomeatal unit: Opacification of the maxillary sinus ostium and ethmoid infundibulum. Left ostiomeatal unit: Opacification of the maxillary sinus ostium and ethmoid infundibulum. Nasal passages: Asymmetric right inferior nasal turbinate hypertrophy, unchanged. Right middle turbinate concha bullosa. 3 mm leftward deviation of an intact nasal septum. Anatomy: No pneumatization  superior to anterior ethmoid notches. Sellar sphenoid pneumatization pattern. No dehiscence of carotid or optic canals. No onodi cell. Other: Poor dentition is again noted with prominent periapical erosions involving bilateral maxillary and mandibular molar teeth. Partially visualized mastoid effusions. Limited assessment of the brain due to streak and motion artifact. Persistent mild left facial soft tissue swelling. No postseptal orbital inflammation. IMPRESSION: 1. Progressive complete bilateral maxillary sinus opacification. 2. Unchanged complete left sphenoid sinus opacification and right sphenoid sinus fluid. 3. Improved aeration of the left frontal and ethmoid sinuses. 4. Persistent mild left facial soft tissue swelling. Electronically Signed   By: Logan Bores M.D.   On: 10/13/2020 14:52    Scheduled Meds: . amLODipine  5 mg Per Tube Daily  . atorvastatin  40 mg Per Tube Daily  . baclofen  5 mg Per Tube 2 times per day  . baclofen  7.5 mg Per Tube Daily  . chlorhexidine gluconate (MEDLINE KIT)  15 mL Mouth Rinse BID  . Chlorhexidine Gluconate Cloth  6 each Topical Daily  . ciprofloxacin  500 mg Per Tube BID  . enoxaparin (LOVENOX) injection  40 mg Subcutaneous Q24H  . feeding supplement (PROSource TF)  45 mL Per Tube Daily  . free water  100 mL Per Tube Q4H  . hydrALAZINE  50 mg Per Tube Q8H  . insulin aspart  0-9 Units Subcutaneous Q4H  . mouth rinse  15 mL Mouth Rinse 10 times per day  . metoprolol tartrate  100 mg Per Tube BID  . modafinil  100 mg Per Tube Daily  . pantoprazole sodium  40 mg Per Tube BID  . scopolamine  1 patch Transdermal Q72H   Continuous Infusions: . sodium chloride Stopped (09/28/20 1344)  . feeding supplement (OSMOLITE 1.2 CAL) 1,000 mL (10/13/20 1344)    Principal Problem:   ICH (intracerebral hemorrhage) (HCC) Active Problems:   Intraparenchymal hemorrhage of brain (HCC)   Acute hypoxemic respiratory failure (HCC)   Fever   Endotracheal tube  present   Status post tracheostomy ([place])   Palliative care by specialist   Muscle hypertonicity   Oropharyngeal dysphagia   Brain injury with loss of consciousness ([place])   Poor prognosis   Consultants:  Palliative care  Pulm  Neurology   Procedures:  Tracheostomy  PEG tube  EEG  Echocardiogram  Antibiotics: Anti-infectives (From admission, onward)   Start     Dose/Rate Route Frequency Ordered Stop   10/08/20 0845  ciprofloxacin (CIPRO) tablet 500 mg        500 mg Per Tube 2 times daily 10/08/20 0754 10/15/20 0759   09/21/20 1545  ceFAZolin (ANCEF) IVPB 1 g/50 mL premix        1 g 100 mL/hr over 30 Minutes Intravenous Every 8 hours 09/21/20 1457 09/28/20 0647   09/20/20 0600  ceFAZolin (ANCEF) IVPB 1 g/50 mL premix  Status:  Discontinued  1 g 100 mL/hr over 30 Minutes Intravenous Every 8 hours 09/14/20 0903 09/14/20 0904   09/16/20 1300  vancomycin (VANCOREADY) IVPB 1250 mg/250 mL  Status:  Discontinued        1,250 mg 166.7 mL/hr over 90 Minutes Intravenous Every 24 hours 09/15/20 1232 09/17/20 0956   09/15/20 1230  vancomycin (VANCOREADY) IVPB 1250 mg/250 mL        1,250 mg 166.7 mL/hr over 90 Minutes Intravenous  Once 09/15/20 1131 09/15/20 1402   09/13/20 1400  ceFEPIme (MAXIPIME) 2 g in sodium chloride 0.9 % 100 mL IVPB        2 g 200 mL/hr over 30 Minutes Intravenous Every 8 hours 09/13/20 1341 09/19/20 2208   09/06/20 0600  vancomycin (VANCOREADY) IVPB 1250 mg/250 mL        1,250 mg 166.7 mL/hr over 90 Minutes Intravenous  Once 09/06/20 0541 09/06/20 0739   09/05/20 1400  ceFAZolin (ANCEF) IVPB 1 g/50 mL premix  Status:  Discontinued        1 g 100 mL/hr over 30 Minutes Intravenous Every 8 hours 09/05/20 1120 09/13/20 1341   08/30/20 0600  vancomycin (VANCOREADY) IVPB 500 mg/100 mL  Status:  Discontinued        500 mg 100 mL/hr over 60 Minutes Intravenous Every 12 hours 08/29/20 1554 09/01/20 1115   08/29/20 1700  ceFEPIme (MAXIPIME) 2 g in sodium  chloride 0.9 % 100 mL IVPB  Status:  Discontinued        2 g 200 mL/hr over 30 Minutes Intravenous Every 12 hours 08/29/20 1554 09/01/20 1115   08/29/20 1645  vancomycin (VANCOREADY) IVPB 1250 mg/250 mL        1,250 mg 166.7 mL/hr over 90 Minutes Intravenous  Once 08/29/20 1554 08/29/20 1820       Time spent: 35 minutes    Erin Hearing ANP  [facility] 7 am - 330 pm/M-F for direct patient care and secure chat Please refer to Amion for contact info 48  days

## 2020-10-14 NOTE — Plan of Care (Signed)
  Problem: Education: Goal: Knowledge of patient specific risk factors addressed and post discharge goals established will improve Outcome: Progressing   Problem: Coping: Goal: Will identify appropriate support needs Outcome: Progressing   Problem: Nutrition: Goal: Dietary intake will improve Outcome: Progressing   Problem: Intracerebral Hemorrhage Tissue Perfusion: Goal: Complications of Intracerebral Hemorrhage will be minimized Outcome: Progressing   Problem: Education: Goal: Knowledge of General Education information will improve Description: Including pain rating scale, medication(s)/side effects and non-pharmacologic comfort measures Outcome: Progressing   Problem: Health Behavior/Discharge Planning: Goal: Ability to manage health-related needs will improve Outcome: Progressing   Problem: Clinical Measurements: Goal: Ability to maintain clinical measurements within normal limits will improve Outcome: Progressing Goal: Will remain free from infection Outcome: Progressing Goal: Diagnostic test results will improve Outcome: Progressing Goal: Respiratory complications will improve Outcome: Progressing Goal: Cardiovascular complication will be avoided Outcome: Progressing   Problem: Nutrition: Goal: Adequate nutrition will be maintained Outcome: Progressing   Problem: Coping: Goal: Level of anxiety will decrease Outcome: Progressing   Problem: Elimination: Goal: Will not experience complications related to bowel motility Outcome: Progressing Goal: Will not experience complications related to urinary retention Outcome: Progressing   Problem: Pain Managment: Goal: General experience of comfort will improve Outcome: Progressing   Problem: Skin Integrity: Goal: Risk for impaired skin integrity will decrease Outcome: Progressing

## 2020-10-14 NOTE — Progress Notes (Signed)
I had a 15-minute discussion with this patient's daughter Nicki [place] Daughter   [phone]   Who is requesting an update I discussed with her CT scan findings of aspiration pneumonia and explained in layman terms how this occurs in a patient who is chronically debilitated and is trach and PEG tube dependent  We will change her ciprofloxacin to Unasyn at this time and we will follow her clinical course   Patient's daughter shared their hope that significant improvement would occur-I have encouraged her to realistically consider that she is already had 1 bout of aspiration earlier in hospital stay and this is probably the second-we may need to involve palliative care in these discussions to go forward and discuss goals of care if she continues to decline however this may be postponed until we complete treatment for circumscribed course of aspiration  Pleas Koch, MD [facility] 5:30 PM

## 2020-10-14 NOTE — TOC Progression Note (Signed)
Transition of Care [facility]) - Progression Note    Patient Details  Name: Charlene [name] MRN: [id number] Date of Birth: [birth year]  Transition of Care [facility]) CM/SW [place], RN Phone Number: 10/14/2020, 11:56 AM  Clinical Narrative:    Case management met with the patient and patient's mother at the bedside regarding transitions of care needs.  The patient's mother, [name], expresses hope that the patient will continue to "wake up" and progress enough to be able to safely go home at some point.  The patient's mother states that she lives alone but is retired and not working currently.  She states that the family members have been taking turns visiting at the bedside and are in the process of trying to establish disability and Medicaid for the patient for post-hospital care.  The patient continues to need trach care support, nutrition through PEG and pending CT today per Erin Hearing, NP.  CM and MSW with DTP Team will continue to follow the patient for transitions of care needs.   Expected Discharge Plan: Skilled Nursing Facility Barriers to Discharge: Continued Medical Work up  Expected Discharge Plan and Services Expected Discharge Plan: [place] In-house Referral: Clinical Social Work Discharge Planning Services: CM Consult   Living arrangements for the past 2 months: Single Family Home                                       Social Determinants of Health (SDOH) Interventions    Readmission Risk Interventions Readmission Risk Prevention Plan 09/29/2020  Transportation Screening Complete  PCP or Specialist Appt within 3-5 Days Complete  HRI or [place] Complete  Social Work Consult for [place] Planning/Counseling Complete  Palliative Care Screening Complete  Medication Review [employer] photographer) Complete  Some recent data might be hidden

## 2020-10-15 DIAGNOSIS — J690 Pneumonitis due to inhalation of food and vomit: Secondary | ICD-10-CM | POA: Diagnosis not present

## 2020-10-15 LAB — COMPREHENSIVE METABOLIC PANEL
ALT: 64 U/L — ABNORMAL HIGH (ref 0–44)
AST: 34 U/L (ref 15–41)
Albumin: 2.6 g/dL — ABNORMAL LOW (ref 3.5–5.0)
Alkaline Phosphatase: 91 U/L (ref 38–126)
Anion gap: 9 (ref 5–15)
BUN: 23 mg/dL — ABNORMAL HIGH (ref 6–20)
CO2: 28 mmol/L (ref 22–32)
Calcium: 9 mg/dL (ref 8.9–10.3)
Chloride: 101 mmol/L (ref 98–111)
Creatinine, Ser: 0.8 mg/dL (ref 0.44–1.00)
GFR, Estimated: >60 mL/min (ref >60)
Glucose, Bld: 151 mg/dL — ABNORMAL HIGH (ref 70–99)
Potassium: 3.2 mmol/L — ABNORMAL LOW (ref 3.5–5.1)
Sodium: 138 mmol/L (ref 135–145)
Total Bilirubin: 0.4 mg/dL (ref 0.3–1.2)
Total Protein: 6.6 g/dL (ref 6.5–8.1)

## 2020-10-15 LAB — GLUCOSE, CAPILLARY
Glucose-Capillary: 170 mg/dL — ABNORMAL HIGH (ref 70–99)
Glucose-Capillary: 148 mg/dL — ABNORMAL HIGH (ref 70–99)
Glucose-Capillary: 161 mg/dL — ABNORMAL HIGH (ref 70–99)
Glucose-Capillary: 123 mg/dL — ABNORMAL HIGH (ref 70–99)
Glucose-Capillary: 127 mg/dL — ABNORMAL HIGH (ref 70–99)

## 2020-10-15 LAB — PROCALCITONIN: Procalcitonin: <0.1 ng/mL

## 2020-10-15 LAB — MAGNESIUM: Magnesium: 2.1 mg/dL (ref 1.7–2.4)

## 2020-10-15 MED ORDER — POTASSIUM CHLORIDE 20 MEQ PO PACK
40.0000 meq | PACK | Freq: Two times a day (BID) | ORAL | Status: DC
  Administered 2020-10-15 – 2020-10-16 (×4): 40 meq via ORAL
  Filled 2020-10-15 (×5): qty 2

## 2020-10-15 MED ORDER — ORAL CARE MOUTH RINSE
15.0000 mL | Freq: Every day | OROMUCOSAL | Status: DC
  Administered 2020-10-15 – 2021-04-16 (×881): 15 mL via OROMUCOSAL

## 2020-10-15 NOTE — Progress Notes (Addendum)
[facility] PROGRESS NOTE  Charlene [name] PNT:[id number] DOB: [birth year] DOA: 08/26/2020 PCP: Patient, [name] (Inactive)    October 08, 2020   Status: Remains inpatient appropriate because:Altered mental status, Unsafe d/c plan, IV treatments appropriate due to intensity of illness or inability to take PO and Inpatient level of care appropriate due to severity of illness   Dispo: The patient is from: Home              Anticipated d/c is to: SNF-family does not wish to place patient in SNF.  If this is the only option they would like to consider taking the patient home although this may be a difficult undertaking given current requirement for trach and understandable fears about managing such complex patient in the home environment.  They have been updated as to why this patient currently is not a candidate for aggressive rehabilitative therapies like would be provided at a brain injury recovery rehab.  **3/24 Husband clarified that he has not delegated POA status to other family members.  **3/25 LCSW called patient's husband at home.  He confirmed that he is okay with daughter managing the Medicaid and disability process and has agreed to change the address to the one requested by the daughter.  Also a letter was requested by daughter to clarify this process and this has been placed in the chart. **3/31 power of attorney documentation has now been placed on the patient's chart.  Documentation stated for Dec 04, 2018              Patient currently is not medically stable to d/c.   Difficult to place patient Yes   Level of care: Progressive  Code Status: Full Family Communication: 3/21 and on 3/22; extensive conversation via telephone with patient's daughter Charlene [name] [phone]); husband Charlene [name] [phone]).  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 Subsequently on 2/10, she developed obtundation and respiratory arrest leading to intubation. She has remained persistently obtunded, now has a trach and a PEG. Hospital course complicated by persistent fever,Serratia pneumoniaand now pre-orbital cellulitis  As of 3/25 with adjustments and hypertonicity medications patient seems to be improving.  She is also waking up more although limited interaction but both eyes are now open and she tracks with the left eye.  Multiple discussions with husband who has confirmed he has not delegated other family members to be POA and he is the primary legal contact for patient.  He has agreed to let patient's daughter take the lead on completing and following up on Medicaid and disability applications including having these applications sent to her address.  2/09 Admit for [place] 2/10 EVD placed, later intubated due to obtundation and respiratory arrest 2/11 MRI Brain, hypertonic saline d/c'd, EVD not working. Husband did not consent to PICC  2/13 EVD removed 2/17 [place] discussion with family, PMT 2/24 Bedside trach and PEG 3/1 Transferred to [facility]  Subjective: Patient examined earlier in the day and did not open  her eyes.  Returned later in the day to update patient's daughter at bedside and patient's eyes were open and she was demonstrating some mild purposeful movement of the right upper extremity.  Continues to drool and demonstrate inability to manage oral secretions.  Extensive conversation held  at bedside with patient's daughter regarding aspiration pneumonia that we are currently treating as well as risk for recurrent aspiration pneumonia given patient's altered mentation and inability to manage oral secretions.  Told her this is a common occurrence and likely will recur.  She verbalized understanding.  In addition both myself and Mia [place], CM discussed at length discharge options including skilled nursing facility versus home options.  Made sure she was aware that there is a high potential that she may need to discharge home with a trach and to be prepared regarding having multiple family members to assist with the care of this patient.  Daughter states she is aware and has approached multiple family members about this.  She is aware that she and the family will need to purchase a disability man for transporting patient back and forth to physician visits.  We also discussed trying to obtain other specialized equipment once discharge disposition clarified.  She is aware that home health services would be limited in the majority of the care burden would follow-up on the family.  If she discharged with a trach it is likely that she would have more frequent follow-up with respiratory services but from nursing care would need to follow again on the shoulders of the family including suctioning.  Objective: Vitals:   10/15/20 0623 10/15/20 0724  BP: 112/70 101/69  Pulse:  81  Resp:  20  Temp:    SpO2:  98%    Intake/Output Summary (Last 24 hours) at 10/15/2020 0817 Last data filed at 10/15/2020 0001 Gross per 24 hour  Intake 2009.33 ml  Output 400 ml  Net 1609.33 ml   Filed Weights   10/13/20 0408 10/14/20 0315 10/15/20 0323  Weight: 66.6 kg 66.6 kg 65.8 kg    Exam:  Constitutional: Awake, no acute distress Respiratory: #6.0 cuffless trach, trach collar with FiO2 21% at 5L,  O2 sats 98 to 100%.  Anterior lung sounds are clear to auscultation.  No increased work of breathing  at rest.  Based on chart review it appears the patient requires tracheal suctioning about 4-5 times in a 24-hour period. Cardiovascular: Heart sounds are normal, extremities warm to touch without peripheral edema.  No tachycardia. Abdomen:  PEG tube for continuous tube feedings, LBM 3/29 soft nontender nondistended with normal active bowel sounds Musculoskeletal: Torticollis with associated left neck deviation markedly improved with patient now noted with head at midline gaze.    Marked improvement in hypertonicity although with full extension of left arm there remains some tightness but does not result in rebound spasticity.   Neurologic: CN 2-12 grossly intact although unable to manage oral secretions, both eyes are open but tracks only with left eye.  Some gross motor semipurposeful movement of right upper extremity. Lower extremities unremarkable and splint boots are in place. 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 Intraventricular penetration with blood filling the 3rd and forth ventricles and nearly filling the right lateral ventricle.  -MRI 2/11 with intraparenchymal hemorrhage centered within the right thalamus  and basal ganglia extending into the right midbrain with intraventricular extension. Effacement of prepontine cistern and similar 4 mm of leftward midline shift. Separate areas of ristricted diffusion and edema involving the right greater than left mesial temporal lobes / parahippocampal cortex, bilateral basal ganglia, and possibly the upper pons that are concerning for acute infarcts, most likely 2/2 mass effect from the hemorrhage (see report)/ Per neuro "scattered b/l hippocampus, posterior CC,  bilateral thalamus, and upper pons, likely due to deep penetrating vessel compression by focal mass-effect".  -2/19 MRI with acute to early subacute infarcts in the bilateral cerebral white matter and right cerebellum, new from 2/11 - interval evolution of multiple other infarcts present on prior MRI, increased hemorrhage along old right EVD tract (see report) - 2/21 head CT with expected evolution of ICH since 2/13 - Echo with normal EF, no intracardiac source of embolism - CTA head/neck without intracranial arterial occlusion or high grade stenosis, no dissection, aneurysm, or hemodynamically significant stenosis of the carotid or vertebral arteries - Head CT 3/9 with expected evolution of hemorrhage involving the right thalamus, basal ganglia, and adjacent white matter from prior exam with developing encephalomalacia.  Resolved previous intraventricular hemorrhage.  Persistent sulcal effacement with mild improvement.  Persistent 3 mm left to right midline shift.  Multiple areas of low attenuation consistent with infarcts within the central white matter, unchanged in distribution from prior.  (see report) - Per neurology- see last follow up note from 2/25 - [place] likely 2/2 hypertension and nonadherence to meds  - s/p trach/peg on 2/24  - no antithrombotic 2/2 ICH -Obstructive hydrocephalus -> s/p external ventricular catheter placement on 08/27/20 by neurosurgery - EVD clamped on 2/12 and d/c'd on 2/13 -EEG suggestive of cortical dysfunction in right hemisphere likely 2/2 underlying bleed as well as severe diffuse encephalopathy, nonspecific etiology . -Continue baclofen 5 mg at 8 AM and 4 PM. -3/29 increased baclofen dosing at bedtime to 7.5 mg and continue to follow LFTs.  Please note that patient has some stable baseline nonobstructive transaminitis. -3/22 EEG no seizures and persistent encephalopathy -Prognosis remains guarded and it is highly doubtful she will return to previous level of  functioning therefore financial counseling notified to pursue disability application -Cont Provigil -Add scheduled Senokot at HS given requirement for SMRs   Chronic pansinusitis/recurrent fevers 2/2 aspiration PNA;History of Serratia Pneumonia  Hospital Acquired Pneumonia:  -Tracheal aspirate from 2/25 and 3/1 with serratia - cefepime 2/27-3/5; repeat sputum cx 3/21 + serratia (likely colonization but w/ recurrent fever opted to treat) -Has completed antibiotics recently for bilateral orbital cellulitis -started on Cipro for tracheal aspirate positive for Serratia marcescens with last dose due 3/31(see below re change in anbxs 2/2 asp PNA)-CRP slightly elevated at 1.4.   -Repeat sinus CT demonstrates progressive complete bilateral maxillary sinus opacification with unchanged complete left sphenoid sinus opacification and right sphenoid sinus fluid.This is reflective of chronic state and current antibiotics would cover regardless -CT Chest 3/30 with aspiration PNA-dtr updated at bedside by attending MD- Cipro changed to Unasyn IV -no increased O2 needs  Neck Contracture/torticollis -Preference for keeping head turned to the left. -Continue baclofen as above -- continue PT/OT PT eval 3/29 Pt with both eyes opened more frequently today and seemed to be attempting some purposeful mvmts with R hand, reached between LE's after having a BM in sitting. Pt still not following commands. Maintained sitting x25 mins with  Min A during static sitting and mod/ max A due to posterior lean  with facilitated mvmt of LE's/ UE's/ cervical spine. PT will continue to follow.  Hypertensive emergency/Malignant HTN Resolved -Home medications include Coreg and HCTZ which have not been reordered -Continue metoprolol, hydralazine  and amlodipine -Blood pressure much better controlled after hypertonicity and pain treated  Acute hypoxemic respiratory failure secondary to CVA requiring mechanical ventilation and  tracheostomy tube -Stable on trach collar and has been transitioned to a #6 cuffless trach -Has significant altered mentation and inability to manage oral secretions at present time is not a safe candidate for decannulation.  Discussed this with family as major obstacle in discharging to home.  This will also decrease options for SNF placement. -Appreciate trach team assistance in management  Bacterial Conjunctivitis/periorbital cellulitis - MRI 3/7 orbits with L periorbital cellulitis  -- CT head 3/9 with persistent subtotal opacification of paranasal sinuses and mastoid air cells, bubbly lucency at base of R maxillary sinus (likely odontogenic in origin) - CT maxillofacial 3/12 - improving cellulitis  - s/p erythromycin ointment - completed course of Ancef   Pain likely related to underlying hypertonicity -Morphine IV and Tylenol prn (watch elev LFTs) -Treat underlying hypertonicity with skeletal muscle relaxers and uptitrate as tolerated with goal to improve and resolve hypertonicity without contributing to oversedation  Dysphagia/Nutrition Status: Nutrition Problem: Inadequate oral intake Etiology: inability to eat Signs/Symptoms: NPO status Interventions: Tube feeding consisting of Osmolite 1.2 Cal at 55 cc/h; continue Prosource liquid 45 cc daily, free water 100 cc every 4 hours per tube; will also continue to follow CBGs regularly while on tube feedings and provide SSI Estimated body mass index is 24.14 kg/m as calculated from the following:   Height as of this encounter: '5\' 5"'  (1.651 m).   Weight as of this encounter: 65.8 kg. If patient eventually discharged to home to teach family how to bolus feed as opposed to utilizing continuous tube feeding infusions.   Other problems: Anasarca -bilateral upper extremity edema, minimal LE edema - suspect related to prolonged hospitalization, hypoalbuminemia -upper extremity US-> age indeterminate SVT of right cephalic vein  Elevated  LFT's - Remain elevated but stable without any obstructive pattern -Continue baclofen and statin  Leukocytosis -Resolved on antibiotics  Anemia -Stable, follow  Sinus Tachycardia Better on BB and after treatment of pain/hypertonicity Echo with EF 60-65% (see report)   Data Reviewed: Basic Metabolic Panel: Recent Labs  Lab 10/10/20 0156 10/12/20 1226 10/15/20 0309 10/15/20 0734  NA 140 140 138  --   K 3.7 4.2 3.2*  --   CL 102 103 101  --   CO2 '31 29 28  ' --   GLUCOSE 134* 83 151*  --   BUN 21* 22* 23*  --   CREATININE 0.70 0.79 0.80  --   CALCIUM 9.3 9.6 9.0  --   MG  --   --   --  2.1   Liver Function Tests: Recent Labs  Lab 10/10/20 0156 10/12/20 1226 10/15/20 0309  AST 56* 65* 34  ALT 97* 104* 64*  ALKPHOS 89 105 91  BILITOT 0.4 0.4 0.4  PROT 6.9 7.3 6.6  ALBUMIN 2.5* 2.8* 2.6*   No results for input(s): LIPASE, AMYLASE in the last 168 hours. No results for input(s): AMMONIA in the last 168 hours. CBC: Recent Labs  Lab 10/12/20 1226  WBC 7.6  NEUTROABS 4.8  HGB 9.9*  HCT 32.2*  MCV 84.5  PLT 384   Cardiac Enzymes: No results for input(s): CKTOTAL, CKMB, CKMBINDEX, TROPONINI in the last 168 hours.  BNP (last 3 results) No results for input(s): BNP in the last 8760 hours.  ProBNP (last 3 results) No results for input(s): PROBNP in the last 8760 hours.  CBG: Recent Labs  Lab 10/14/20 1644 10/14/20 1923 10/14/20 2305 10/15/20 0322 10/15/20 0721  GLUCAP 114* 122* 167* 170* 148*    Recent Results (from the past 240 hour(s))  Culture, Respiratory w Gram Stain     Status: None   Collection Time: 10/05/20  1:12 PM   Specimen: Tracheal Aspirate; Respiratory  Result Value Ref Range Status   Specimen Description TRACHEAL ASPIRATE  Final   Special Requests NONE  Final   Gram Stain   Final    NO WBC SEEN ABUNDANT GRAM POSITIVE COCCI ABUNDANT GRAM NEGATIVE RODS MODERATE GRAM POSITIVE RODS Performed at [facility], [place] [address] , [place] [address]    Culture RARE SERRATIA MARCESCENS  Final   Report Status 10/07/2020 FINAL  Final   Organism ID, Bacteria SERRATIA MARCESCENS  Final      Susceptibility   Serratia marcescens - MIC*    CEFAZOLIN >=64 RESISTANT Resistant     CEFEPIME <=0.12 SENSITIVE Sensitive     CEFTAZIDIME <=1 SENSITIVE Sensitive     CEFTRIAXONE <=0.25 SENSITIVE Sensitive     CIPROFLOXACIN <=0.25 SENSITIVE Sensitive     GENTAMICIN <=1 SENSITIVE Sensitive     TRIMETH/SULFA <=20 SENSITIVE Sensitive     * RARE SERRATIA MARCESCENS     Studies: CT CHEST WO CONTRAST  Result Date: 10/14/2020 CLINICAL DATA:  Pneumonia. EXAM: CT CHEST WITHOUT CONTRAST TECHNIQUE: Multidetector CT imaging of the chest was performed following the standard protocol without IV contrast. COMPARISON:  None. FINDINGS: Cardiovascular: No significant vascular findings. Normal heart size. No pericardial effusion. Mediastinum/Nodes: Tracheostomy tube is in grossly good position. The esophagus is unremarkable. No adenopathy is noted. Thyroid gland is unremarkable. Lungs/Pleura: No pneumothorax or pleural effusion is noted. Mild biapical scarring is noted. Left lung is clear. There appears to be aspirated material within the right lower lobe bronchi with surrounding opacity concerning for aspiration pneumonia Upper Abdomen: No acute abnormality. Musculoskeletal: No chest wall mass or suspicious bone lesions identified. IMPRESSION: Probable aspirated material within the right lower lobe bronchi with surrounding opacity in the right lower lobe concerning for aspiration pneumonia. Electronically Signed   By: Marijo Conception M.D.   On: 10/14/2020 16:40   CT MAXILLOFACIAL  LTD WO CM  Result Date: 10/13/2020 CLINICAL DATA:  Sinusitis.  Persistent purulent drainage. EXAM: CT PARANASAL SINUS LIMITED WITHOUT CONTRAST TECHNIQUE: Multidetector CT images of the paranasal sinuses were obtained using the standard protocol without intravenous  contrast. COMPARISON:  09/26/2020 FINDINGS: Paranasal sinuses: Frontal: Clear bilaterally with interval resolution of left frontal sinus opacification. Ethmoid: Mild mucosal thickening bilaterally, greatly improved on the left. Maxillary: Progressive complete maxillary sinus opacification bilaterally. Sphenoid: Persistent complete left sphenoid sinus opacification. Unchanged moderate volume fluid in the right sphenoid sinus. Right ostiomeatal unit: Opacification of the maxillary sinus ostium and ethmoid infundibulum. Left ostiomeatal unit: Opacification of the maxillary sinus ostium and ethmoid infundibulum. Nasal passages: Asymmetric right inferior nasal turbinate hypertrophy, unchanged. Right middle turbinate concha bullosa. 3 mm leftward deviation of an intact nasal septum. Anatomy: No pneumatization superior to anterior ethmoid notches. Sellar sphenoid pneumatization pattern. No dehiscence of carotid or optic canals. No onodi cell. Other: Poor dentition is again noted with prominent periapical erosions involving bilateral maxillary and mandibular molar teeth. Partially visualized mastoid effusions. Limited assessment of the brain  due to streak and motion artifact. Persistent mild left facial soft tissue swelling. No postseptal orbital inflammation. IMPRESSION: 1. Progressive complete bilateral maxillary sinus opacification. 2. Unchanged complete left sphenoid sinus opacification and right sphenoid sinus fluid. 3. Improved aeration of the left frontal and ethmoid sinuses. 4. Persistent mild left facial soft tissue swelling. Electronically Signed   By: Logan Bores M.D.   On: 10/13/2020 14:52    Scheduled Meds: . amLODipine  5 mg Per Tube Daily  . atorvastatin  40 mg Per Tube Daily  . baclofen  5 mg Per Tube 2 times per day  . baclofen  7.5 mg Per Tube Daily  . chlorhexidine gluconate (MEDLINE KIT)  15 mL Mouth Rinse BID  . Chlorhexidine Gluconate Cloth  6 each Topical Daily  . enoxaparin (LOVENOX)  injection  40 mg Subcutaneous Q24H  . feeding supplement (PROSource TF)  45 mL Per Tube Daily  . free water  100 mL Per Tube Q4H  . hydrALAZINE  50 mg Per Tube Q8H  . insulin aspart  0-9 Units Subcutaneous Q4H  . mouth rinse  15 mL Mouth Rinse 10 times per day  . metoprolol tartrate  100 mg Per Tube BID  . modafinil  100 mg Per Tube Daily  . pantoprazole sodium  40 mg Per Tube BID  . potassium chloride  40 mEq Oral BID  . scopolamine  1 patch Transdermal Q72H  . sennosides  5 mL Per Tube QHS   Continuous Infusions: . sodium chloride Stopped (09/28/20 1344)  . ampicillin-sulbactam (UNASYN) IV 3 g (10/15/20 0223)  . feeding supplement (OSMOLITE 1.2 CAL) 1,000 mL (10/14/20 2030)    Principal Problem:   ICH (intracerebral hemorrhage) (HCC) Active Problems:   Intraparenchymal hemorrhage of brain (HCC)   Acute hypoxemic respiratory failure (HCC)   Fever   Endotracheal tube present   Status post tracheostomy ([place])   Palliative care by specialist   Muscle hypertonicity   Oropharyngeal dysphagia   Brain injury with loss of consciousness ([place])   Poor prognosis   Consultants:  Palliative care  Pulm  Neurology   Procedures:  Tracheostomy  PEG tube  EEG  Echocardiogram  Antibiotics: Anti-infectives (From admission, onward)   Start     Dose/Rate Route Frequency Ordered Stop   10/14/20 1815  Ampicillin-Sulbactam (UNASYN) 3 g in sodium chloride 0.9 % 100 mL IVPB        3 g 200 mL/hr over 30 Minutes Intravenous Every 6 hours 10/14/20 1754     10/08/20 0845  ciprofloxacin (CIPRO) tablet 500 mg  Status:  Discontinued        500 mg Per Tube 2 times daily 10/08/20 0754 10/14/20 1731   09/21/20 1545  ceFAZolin (ANCEF) IVPB 1 g/50 mL premix        1 g 100 mL/hr over 30 Minutes Intravenous Every 8 hours 09/21/20 1457 09/28/20 0647   09/20/20 0600  ceFAZolin (ANCEF) IVPB 1 g/50 mL premix  Status:  Discontinued        1 g 100 mL/hr over 30 Minutes Intravenous Every 8 hours  09/14/20 0903 09/14/20 0904   09/16/20 1300  vancomycin (VANCOREADY) IVPB 1250 mg/250 mL  Status:  Discontinued        1,250 mg 166.7 mL/hr over 90 Minutes Intravenous Every 24 hours 09/15/20 1232 09/17/20 0956   09/15/20 1230  vancomycin (VANCOREADY) IVPB 1250 mg/250 mL        1,250 mg 166.7 mL/hr over 90 Minutes Intravenous  Once  09/15/20 1131 09/15/20 1402   09/13/20 1400  ceFEPIme (MAXIPIME) 2 g in sodium chloride 0.9 % 100 mL IVPB        2 g 200 mL/hr over 30 Minutes Intravenous Every 8 hours 09/13/20 1341 09/19/20 2208   09/06/20 0600  vancomycin (VANCOREADY) IVPB 1250 mg/250 mL        1,250 mg 166.7 mL/hr over 90 Minutes Intravenous  Once 09/06/20 0541 09/06/20 0739   09/05/20 1400  ceFAZolin (ANCEF) IVPB 1 g/50 mL premix  Status:  Discontinued        1 g 100 mL/hr over 30 Minutes Intravenous Every 8 hours 09/05/20 1120 09/13/20 1341   08/30/20 0600  vancomycin (VANCOREADY) IVPB 500 mg/100 mL  Status:  Discontinued        500 mg 100 mL/hr over 60 Minutes Intravenous Every 12 hours 08/29/20 1554 09/01/20 1115   08/29/20 1700  ceFEPIme (MAXIPIME) 2 g in sodium chloride 0.9 % 100 mL IVPB  Status:  Discontinued        2 g 200 mL/hr over 30 Minutes Intravenous Every 12 hours 08/29/20 1554 09/01/20 1115   08/29/20 1645  vancomycin (VANCOREADY) IVPB 1250 mg/250 mL        1,250 mg 166.7 mL/hr over 90 Minutes Intravenous  Once 08/29/20 1554 08/29/20 1820       Time spent: 35 minutes    Erin Hearing ANP  [facility] 7 am - 330 pm/M-F for direct patient care and secure chat Please refer to Amion for contact info 49  days

## 2020-10-15 NOTE — Progress Notes (Signed)
Occupational Therapy Treatment Patient Details Name: DARREN NODAL MRN: [id number] DOB: 02-27-1966 Today's Date: 10/15/2020    History of present illness Pt is 55 yo female presents to [facility] on 2/9 with L sided weakness and headaches. PMH includes anxiety, HTN. 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 s/p Right frontal ventricular catheter placement on 2/10, stopped functioning on 2/11.  MRI 2/11 shows right greater than left mesial temporal lobes / parahippocampal cortex, bilateral basal ganglia, and possibly the upper pons that are concerning for acute infarcts, most likely secondary to mass effect from the hemorrhage. ETT 2/10-present.  Pt with trach and PEG on 09/10/20. Hospital course complicated by persistent fever, Serratia pneumonia and now pre-orbital cellulitis.   OT comments  Patient supine in bed, daughter at side. Reviewed OT role with daughter, demonstrated PROM/stretch to L UE and use of resting hand splint.  Daughter declined hands on education, therefore therapist demonstrated and verbally educated.  Therapist mentioned plan for home at dc, and daughter reports plan for rehab--reached out to NP for clarification.  Will follow acutely.   Follow Up Recommendations  SNF;Supervision/Assistance - 24 hour    Equipment Recommendations  Hospital bed;Wheelchair cushion (measurements OT);Wheelchair (measurements OT);Other (comment) (hoyer lift)    Recommendations for Other Services      Precautions / Restrictions Precautions Precautions: Fall Precaution Comments: peg and trach Restrictions Weight Bearing Restrictions: No       Mobility Bed Mobility                    Transfers                      Balance                                           ADL either performed or assessed with clinical  judgement   ADL                                         General ADL Comments: total A     Vision       Perception     Praxis      Cognition Arousal/Alertness: Awake/alert Behavior During Therapy: Flat affect Overall Cognitive Status: Difficult to assess                                 General Comments: eyes partially open but gaze downward with no attempts to open eyes fully, patient continues to withdrawl with movement of L UE        Exercises Exercises: General Upper Extremity General Exercises - Upper Extremity Shoulder Flexion: PROM;Left;10 reps;Supine Shoulder ABduction: PROM;Left;10 reps;Supine Elbow Flexion: PROM;Left;10 reps;Supine Elbow Extension: PROM;Left;10 reps;Supine Wrist Flexion: PROM;Left;10 reps;Supine Wrist Extension: PROM;Left;10 reps;Supine Composite Extension: PROM;Left;10 reps;Supine   Shoulder Instructions       General Comments PROM completed to L UE, educated Daughter on ROM exercises, resting hand splint use, care and wear schedule by verbally and demonstrating technique only as daughter declined hands on education.    Pertinent Vitals/ Pain  Pain Assessment: Faces Faces Pain Scale: Hurts a little bit Pain Location: withdraws at times with UE ROM Pain Descriptors / Indicators: Discomfort Pain Intervention(s): Limited activity within patient's tolerance;Monitored during session;Repositioned  Home Living                                          Prior Functioning/Environment              Frequency  Min 1X/week        Progress Toward Goals  OT Goals(current goals can now be found in the care plan section)  Progress towards OT goals: Progressing toward goals  Acute Rehab OT Goals Patient Stated Goal: pt unable OT Goal Formulation: Patient unable to participate in goal setting  Plan Discharge plan remains appropriate;Frequency needs to be updated    Co-evaluation                  AM-PAC OT "6 Clicks" Daily Activity     Outcome Measure   Help from another person eating meals?: Total Help from another person taking care of personal grooming?: Total Help from another person toileting, which includes using toliet, bedpan, or urinal?: Total Help from another person bathing (including washing, rinsing, drying)?: Total Help from another person to put on and taking off regular upper body clothing?: Total Help from another person to put on and taking off regular lower body clothing?: Total 6 Click Score: 6    End of Session    OT Visit Diagnosis: Other symptoms and signs involving the nervous system (R29.898);Other symptoms and signs involving cognitive function;Other (comment) Symptoms and signs involving cognitive functions: Other Nontraumatic ICH Hemiplegia - 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

## 2020-10-15 NOTE — Plan of Care (Signed)
  Problem: Elimination: Goal: Will not experience complications related to bowel motility Outcome: Progressing Goal: Will not experience complications related to urinary retention Outcome: Progressing   Problem: Pain Managment: Goal: General experience of comfort will improve Outcome: Progressing   Problem: Skin Integrity: Goal: Risk for impaired skin integrity will decrease Outcome: Progressing   

## 2020-10-15 NOTE — TOC Progression Note (Signed)
Transition of Care [facility]) - Progression Note    Patient Details  Name: Charlene [name] MRN: [id number] Date of Birth: 1966-04-14  Transition of Care [facility]) CM/SW [place], RN Phone Number: 10/15/2020, 12:45 PM  Clinical Narrative:    Case management met with the patient and daughter, Charlene [name], at the bedside regarding transitions of care.    Erin Hearing, NP was also present for assessment and discussion at the bedside.  I explained in detail that the patient might need SNF placement or home health with 24 hour supervision once patient is medically stable for discharge.  The patient's daughter was open to patient receiving rehab services but not in a nursing home environment and foresees taking the patient home with assistance from home health and other family members support.  The patient is presently unable to control secretions and weak cough and drooling with trach collar present.  The patient's daughter is aware and understands that when the patient is discharged she will need 24 hour supervision and few family members available to assist with patient's care needs at home considering the patient needs total care, including a trach and PEG presently.  The patient's daughter states that she understands that she will need dme and home health services to assist family at home.  The patient's daughter states that she will need an alternate home to provide this care since her bedrooms are upstairs at the present time.  The patient's daughter has given nursing staff a copy of the POA and it is present on the patient's chart.  The POA was signed by patient prior to patient's present hospitalization and was stamped by a notary.  [name] states in a previous converstation that he was unaware of patient having a POA.  [name], MSW is aware of the POA paperwork.  The patient's daughter states that the family members are all communicating well together, including the  patient's husband and are all willing to provide assistance with home care once medically stable to discharge to home environment, considering family is refusing nursing home placement for care.  Patient's daughter has been updating the [place] worker, Gustavo Lah, DSS - [email or web address].  CM and MSW will continue to follow the patient for transitions of care needs.   Expected Discharge Plan: [place] Barriers to Discharge: Continued Medical Work up  Expected Discharge Plan and Services Expected Discharge Plan: [place] In-house Referral: Clinical Social Work Discharge Planning Services: CM Consult   Living arrangements for the past 2 months: Single Family Home                                       Social Determinants of Health (SDOH) Interventions    Readmission Risk Interventions Readmission Risk Prevention Plan 09/29/2020  Transportation Screening Complete  PCP or Specialist Appt within 3-5 Days Complete  HRI or [place] Complete  Social Work Consult for [place] Planning/Counseling Complete  Palliative Care Screening Complete  Medication Review Press photographer) Complete  Some recent data might be hidden

## 2020-10-16 LAB — GLUCOSE, CAPILLARY
Glucose-Capillary: 122 mg/dL — ABNORMAL HIGH (ref 70–99)
Glucose-Capillary: 123 mg/dL — ABNORMAL HIGH (ref 70–99)
Glucose-Capillary: 126 mg/dL — ABNORMAL HIGH (ref 70–99)
Glucose-Capillary: 129 mg/dL — ABNORMAL HIGH (ref 70–99)
Glucose-Capillary: 129 mg/dL — ABNORMAL HIGH (ref 70–99)
Glucose-Capillary: 135 mg/dL — ABNORMAL HIGH (ref 70–99)
Glucose-Capillary: 154 mg/dL — ABNORMAL HIGH (ref 70–99)

## 2020-10-16 LAB — PROCALCITONIN: Procalcitonin: <0.1 ng/mL

## 2020-10-16 MED ORDER — MODAFINIL 100 MG PO TABS: 200.0000 mg | ORAL_TABLET | Freq: Every day | ORAL | Status: DC

## 2020-10-16 MED ORDER — MODAFINIL 100 MG PO TABS
200.0000 mg | ORAL_TABLET | Freq: Every day | ORAL | Status: DC
  Administered 2020-10-16 – 2021-04-16 (×183): 200 mg
  Filled 2020-10-16 (×187): qty 2

## 2020-10-16 NOTE — Progress Notes (Addendum)
[facility] PROGRESS NOTE  SHAQUAYLA KLIMAS GBE:[id number] DOB: [birth year] DOA: 08/26/2020 PCP: Patient, [name] (Inactive)    October 08, 2020     10/16/2020                        10/16/2020   Status: Remains inpatient appropriate because:Altered mental status, Unsafe d/c plan, IV treatments appropriate due to intensity of illness or inability to take PO and Inpatient level of care appropriate due to severity of illness   Dispo: The patient is from: Home              Anticipated d/c is to: SNF-family does not wish to place patient in SNF.  If this is the only option they would like to consider taking the patient home although this may be a difficult undertaking given current requirement for trach and understandable fears about managing such complex patient in the home environment.  They have been updated as to why this patient currently is not a candidate for aggressive rehabilitative therapies like would be provided at a brain injury recovery rehab.  **3/24 Husband clarified that he has not delegated POA status to other family members.  **3/25 LCSW called patient's husband at home.  He confirmed that he is okay with daughter managing the Medicaid and disability process and has agreed to change the address to the one requested by the daughter.  Also a letter was requested by daughter to clarify this process and this has been placed in the chart. **3/31 power of attorney documentation has now been placed on the patient's chart.  Documentation stated for Dec 04, 2018              Patient currently is not medically stable to d/c.   Difficult to place patient Yes   Level of care: Progressive  Code Status: Full Family Communication:  extensive conversation via telephone with [name] [phone]) at bedside on 4/1.; [name] [phone]) is her POA and legal contact.   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 Subsequently on 2/10, she developed obtundation and respiratory arrest leading to intubation. She has remained persistently obtunded, now has a trach and a PEG. Hospital course complicated by persistent fever,Serratia pneumoniaand now pre-orbital cellulitis  As of 3/25 with adjustments and hypertonicity medications patient seems to be improving.  She is also waking up more although limited interaction but both eyes are now open and she tracks with the left eye.  Multiple discussions with husband who has confirmed he has not delegated other family members to be POA and he is the primary legal contact for patient.  He has agreed to let patient's daughter take the lead on completing and following up on Medicaid and disability applications including having these applications sent to her address.  2/09 Admit for [facility] 2/10 EVD placed, later intubated due to obtundation and respiratory arrest 2/11 MRI Brain, hypertonic saline d/c'd, EVD not working. Husband did not consent to PICC  2/13 EVD removed 2/17 GOC discussion with family, PMT 2/24 Bedside  trach and PEG 3/1 Transferred to [place]  Subjective: Upon entry into the room patient's eyes were open.  Attempted to get her to follow simple commands by blinking eyes but was unsuccessful.  Attempted to get her to lift her right arm or lift right finger again unsuccessful.  Later returned to the room at daughter's request and spoke extensively with her regarding evolving discharge plan.  Again reemphasized that we are honoring her goal of trying to get her mother  to a brain injury rehab but that is dependent on her mother's improvement in ability to participate.  Reemphasized that since they do not want the patient to go to skilled nursing that if she is not a candidate for brain injury rehab facility that she would need to go home.  Daughter requested that family members are misinterpreting information about going home and are becoming upset thinking that we are trying to send the patient home now.  She requested to only discussed discharge options to home with herself and the patient's husband.  Nursing staff updated.  Did explain to patient that regardless of where she goes after leaving here eventually she will need to go home and therefore if she keeps some of the equipment or debility she has now that it would be important to begin learning how to care for the patient now as opposed to right before discharge.  Daughter agreed but requests at this juncture that those type of interactions be performed by herself only.  She was also updated on role and expectations of CIR.  I explained that this group of physicians and extenders is very skilled at taking care of all kinds of brain injury patients and would be a valuable resource to her.  Objective: Vitals:   10/16/20 0619 10/16/20 0712  BP: 126/77 134/82  Pulse:  98  Resp:  12  Temp:  99.1 F (37.3 C)  SpO2:  99%    Intake/Output Summary (Last 24 hours) at 10/16/2020 0821 Last data filed at 10/16/2020 0300 Gross per 24 hour  Intake 1947.61 ml  Output 650 ml  Net 1297.61 ml   Filed Weights   10/14/20 0315 10/15/20 0323 10/16/20 0403  Weight: 66.6 kg 65.8 kg 66.7 kg    Exam:  Constitutional: Awake, no acute distress Respiratory: #6.0 cuffless trach, trach collar with FiO2 21% at 5L,  O2 sats 98 to 100%.  No increased work of breathing.  No spontaneous cough detected.  No significant tracheal secretions. Cardiovascular: S1 and S2.  Only has transient tachycardia when in pain or with stimulation.   Extremities warm to the touch, no peripheral edema Abdomen:  PEG tube for continuous tube feedings, abdomen soft and does not appear to be tender with palpation.  Normoactive bowel sounds are present.  LBM 3/31 Musculoskeletal: Torticollis with associated left neck deviation markedly improved with patient now noted with head at midline gaze.    Marked improvement in hypertonicity although with full extension of left arm there remains some tightness but does not result in rebound spasticity.   Neurologic: CN 2-12 grossly intact although unable to manage oral secretions, both eyes are open but tracks only with left eye.  Some gross motor semipurposeful movement of right upper extremity. Lower extremities unremarkable and splint boots are in place. Psychiatric: Awake, is not interactive during exam.  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 Intraventricular penetration with blood filling the 3rd and forth ventricles and nearly filling the right lateral ventricle.  -MRI 2/11 with intraparenchymal hemorrhage centered within the right thalamus and basal ganglia extending into the right midbrain with intraventricular extension. Effacement of prepontine cistern and similar 4 mm of leftward midline shift. Separate areas of ristricted diffusion and edema involving the right greater than left mesial temporal lobes / parahippocampal cortex, bilateral basal ganglia, and possibly the upper pons that are concerning for acute infarcts, most likely 2/2 mass effect from the hemorrhage (see report)/ Per neuro "scattered b/l hippocampus, posterior CC,  bilateral thalamus, and upper pons, likely due to deep penetrating vessel compression by focal mass-effect".  -2/19 MRI with acute to early subacute infarcts in the bilateral cerebral white matter and right cerebellum, new from 2/11 - interval evolution of multiple other infarcts present on prior MRI, increased hemorrhage along old right EVD tract (see report) - 2/21 head CT with expected evolution of ICH since 2/13 - Echo with normal EF, no intracardiac source of embolism - CTA head/neck without intracranial arterial occlusion or high grade stenosis, no dissection, aneurysm, or hemodynamically significant stenosis of the carotid or vertebral arteries - Head CT 3/9 with expected evolution of hemorrhage involving the right thalamus, basal ganglia, and adjacent white matter from prior exam with developing encephalomalacia.  Resolved previous intraventricular hemorrhage.  Persistent sulcal effacement with mild improvement.  Persistent 3 mm left to right midline shift.  Multiple areas of low attenuation consistent with infarcts within the central white matter, unchanged in distribution from prior.  (see report) - Per neurology- see last follow up note from 2/25 - [place] likely 2/2 hypertension and nonadherence to meds  - s/p trach/peg on 2/24  - no antithrombotic 2/2 ICH -Obstructive hydrocephalus -> s/p external ventricular catheter placement on 08/27/20 by neurosurgery - EVD clamped on 2/12 and d/c'd on 2/13 -EEG suggestive of cortical dysfunction in right hemisphere likely 2/2 underlying bleed as well as severe diffuse encephalopathy, nonspecific etiology . -Continue baclofen 5 mg at 8 AM and 4 PM. -Continue baclofen 7.5 mg at bedtime -continue to follow LFTs.  Please note that patient has some stable baseline nonobstructive transaminitis. -3/22 EEG no seizures and persistent encephalopathy -Prognosis remains guarded and it is highly doubtful she will return to previous level of functioning therefore  financial counseling notified to pursue disability application -Cont Provigil -Add scheduled Senokot at HS given requirement for SMRs -Have asked CIR to evaluate patient in regards to giving patient's family and accurate assessment regarding expectations for eventual brain injury rehab at outside facility.  I have also given the patient's daughter a list of requirements in regards to patient physical and mental abilities that are necessary for her to participate with aggressive brain injury rehab.  I explained to her that these are criteria that are used throughout the world in the rehab setting.   Chronic pansinusitis/recurrent fevers 2/2 aspiration PNA;History of Serratia Pneumonia  Hospital Acquired Pneumonia:  -Tracheal aspirate from 2/25 and 3/1 with serratia - cefepime 2/27-3/5; repeat sputum cx 3/21 + serratia (likely colonization but w/ recurrent fever opted to treat) -Has completed antibiotics recently for bilateral orbital cellulitis -started on Cipro for tracheal aspirate positive for Serratia marcescens with last dose due 3/31(see below re change in anbxs 2/2 asp PNA)-CRP slightly elevated at 1.4.   -  Repeat sinus CT demonstrates progressive complete bilateral maxillary sinus opacification with unchanged complete left sphenoid sinus opacification and right sphenoid sinus fluid.This is reflective of chronic state and current antibiotics would cover regardless -CT Chest 3/30 with aspiration PNA-dtr updated at bedside by attending MD- Cipro changed to Unasyn IV -no increased O2 needs  Neck Contracture/torticollis -Preference for keeping head turned to the left. -Continue baclofen as above -- continue PT/OT weekly per their recommendations given patient's inability to effectively participate at this juncture.  Nursing staff performing PROM activities.  Family also instructed and has been performing intermittently. PT eval 3/29 Pt with both eyes opened more frequently today and seemed to  be attempting some purposeful mvmts with R hand, reached between LE's after having a BM in sitting. Pt still not following commands. Maintained sitting x25 mins with  Min A during static sitting and mod/ max A due to posterior lean with facilitated mvmt of LE's/ UE's/ cervical spine. PT will continue to follow.  Hypertensive emergency/Malignant HTN Resolved -Home medications include Coreg and HCTZ which have not been reordered -Continue metoprolol, hydralazine  and amlodipine -Blood pressure much better controlled after hypertonicity and pain treated  Acute hypoxemic respiratory failure secondary to CVA requiring mechanical ventilation and tracheostomy tube -Stable on trach collar and has been transitioned to a #6 cuffless trach -Has significant altered mentation and inability to manage oral secretions at present time is not a safe candidate for decannulation.  Discussed this with family as major obstacle in discharging to home.  This will also decrease options for SNF placement. -Appreciate trach team assistance in management  Bacterial Conjunctivitis/periorbital cellulitis - MRI 3/7 orbits with L periorbital cellulitis  -- CT head 3/9 with persistent subtotal opacification of paranasal sinuses and mastoid air cells, bubbly lucency at base of R maxillary sinus (likely odontogenic in origin) - CT maxillofacial 3/12 - improving cellulitis  - s/p erythromycin ointment - completed course of Ancef   Pain likely related to underlying hypertonicity -Morphine IV and Tylenol prn (watch elev LFTs) -Treat underlying hypertonicity with skeletal muscle relaxers and uptitrate as tolerated with goal to improve and resolve hypertonicity without contributing to oversedation  Dysphagia/Nutrition Status: Nutrition Problem: Inadequate oral intake Etiology: inability to eat Signs/Symptoms: NPO status Interventions: Tube feeding consisting of Osmolite 1.2 Cal at 55 cc/h; continue Prosource liquid 45 cc  daily, free water 100 cc every 4 hours per tube; will also continue to follow CBGs regularly while on tube feedings and provide SSI Estimated body mass index is 24.47 kg/m as calculated from the following:   Height as of this encounter: '5\' 5"'  (1.651 m).   Weight as of this encounter: 66.7 kg. If patient eventually discharged to home to teach family how to bolus feed as opposed to utilizing continuous tube feeding infusions.   Other problems: Anasarca -bilateral upper extremity edema, minimal LE edema - suspect related to prolonged hospitalization, hypoalbuminemia -upper extremity US-> age indeterminate SVT of right cephalic vein  Elevated LFT's - Remain elevated but stable without any obstructive pattern -Continue baclofen and statin  Leukocytosis -Resolved on antibiotics  Anemia -Stable, follow  Sinus Tachycardia Better on BB and after treatment of pain/hypertonicity Echo with EF 60-65% (see report)   Data Reviewed: Basic Metabolic Panel: Recent Labs  Lab 10/10/20 0156 10/12/20 1226 10/15/20 0309 10/15/20 0734  NA 140 140 138  --   K 3.7 4.2 3.2*  --   CL 102 103 101  --   CO2 '31 29 28  ' --  GLUCOSE 134* 83 151*  --   BUN 21* 22* 23*  --   CREATININE 0.70 0.79 0.80  --   CALCIUM 9.3 9.6 9.0  --   MG  --   --   --  2.1   Liver Function Tests: Recent Labs  Lab 10/10/20 0156 10/12/20 1226 10/15/20 0309  AST 56* 65* 34  ALT 97* 104* 64*  ALKPHOS 89 105 91  BILITOT 0.4 0.4 0.4  PROT 6.9 7.3 6.6  ALBUMIN 2.5* 2.8* 2.6*   No results for input(s): LIPASE, AMYLASE in the last 168 hours. No results for input(s): AMMONIA in the last 168 hours. CBC: Recent Labs  Lab 10/12/20 1226  WBC 7.6  NEUTROABS 4.8  HGB 9.9*  HCT 32.2*  MCV 84.5  PLT 384   Cardiac Enzymes: No results for input(s): CKTOTAL, CKMB, CKMBINDEX, TROPONINI in the last 168 hours. BNP (last 3 results) No results for input(s): BNP in the last 8760 hours.  ProBNP (last 3 results) No  results for input(s): PROBNP in the last 8760 hours.  CBG: Recent Labs  Lab 10/15/20 1504 10/15/20 2027 10/16/20 0003 10/16/20 0359 10/16/20 0730  GLUCAP 123* 127* 135* 129* 123*    No results found for this or any previous visit (from the past 240 hour(s)).   Studies: CT CHEST WO CONTRAST  Result Date: 10/14/2020 CLINICAL DATA:  Pneumonia. EXAM: CT CHEST WITHOUT CONTRAST TECHNIQUE: Multidetector CT imaging of the chest was performed following the standard protocol without IV contrast. COMPARISON:  None. FINDINGS: Cardiovascular: No significant vascular findings. Normal heart size. No pericardial effusion. Mediastinum/Nodes: Tracheostomy tube is in grossly good position. The esophagus is unremarkable. No adenopathy is noted. Thyroid gland is unremarkable. Lungs/Pleura: No pneumothorax or pleural effusion is noted. Mild biapical scarring is noted. Left lung is clear. There appears to be aspirated material within the right lower lobe bronchi with surrounding opacity concerning for aspiration pneumonia Upper Abdomen: No acute abnormality. Musculoskeletal: No chest wall mass or suspicious bone lesions identified. IMPRESSION: Probable aspirated material within the right lower lobe bronchi with surrounding opacity in the right lower lobe concerning for aspiration pneumonia. Electronically Signed   By: Marijo Conception M.D.   On: 10/14/2020 16:40    Scheduled Meds: . amLODipine  5 mg Per Tube Daily  . atorvastatin  40 mg Per Tube Daily  . baclofen  5 mg Per Tube 2 times per day  . baclofen  7.5 mg Per Tube Daily  . chlorhexidine gluconate (MEDLINE KIT)  15 mL Mouth Rinse BID  . Chlorhexidine Gluconate Cloth  6 each Topical Daily  . enoxaparin (LOVENOX) injection  40 mg Subcutaneous Q24H  . feeding supplement (PROSource TF)  45 mL Per Tube Daily  . free water  100 mL Per Tube Q4H  . hydrALAZINE  50 mg Per Tube Q8H  . insulin aspart  0-9 Units Subcutaneous Q4H  . mouth rinse  15 mL Mouth Rinse  5 X Daily  . metoprolol tartrate  100 mg Per Tube BID  . modafinil  100 mg Per Tube Daily  . pantoprazole sodium  40 mg Per Tube BID  . potassium chloride  40 mEq Oral BID  . scopolamine  1 patch Transdermal Q72H  . sennosides  5 mL Per Tube QHS   Continuous Infusions: . sodium chloride Stopped (09/28/20 1344)  . ampicillin-sulbactam (UNASYN) IV 3 g (10/16/20 0657)  . feeding supplement (OSMOLITE 1.2 CAL) 1,000 mL (10/14/20 2030)    Principal Problem:  ICH (intracerebral hemorrhage) (HCC) Active Problems:   Intraparenchymal hemorrhage of brain (HCC)   Acute hypoxemic respiratory failure (HCC)   Fever   Endotracheal tube present   Status post tracheostomy ([place])   Palliative care by specialist   Muscle hypertonicity   Oropharyngeal dysphagia   Brain injury with loss of consciousness ([place])   Poor prognosis   Aspiration pneumonia of right lower lobe [facility])   Consultants:  Palliative care  Pulm  Neurology   Procedures:  Tracheostomy  PEG tube  EEG  Echocardiogram  Antibiotics: Anti-infectives (From admission, onward)   Start     Dose/Rate Route Frequency Ordered Stop   10/14/20 1815  Ampicillin-Sulbactam (UNASYN) 3 g in sodium chloride 0.9 % 100 mL IVPB        3 g 200 mL/hr over 30 Minutes Intravenous Every 6 hours 10/14/20 1754     10/08/20 0845  ciprofloxacin (CIPRO) tablet 500 mg  Status:  Discontinued        500 mg Per Tube 2 times daily 10/08/20 0754 10/14/20 1731   09/21/20 1545  ceFAZolin (ANCEF) IVPB 1 g/50 mL premix        1 g 100 mL/hr over 30 Minutes Intravenous Every 8 hours 09/21/20 1457 09/28/20 0647   09/20/20 0600  ceFAZolin (ANCEF) IVPB 1 g/50 mL premix  Status:  Discontinued        1 g 100 mL/hr over 30 Minutes Intravenous Every 8 hours 09/14/20 0903 09/14/20 0904   09/16/20 1300  vancomycin (VANCOREADY) IVPB 1250 mg/250 mL  Status:  Discontinued        1,250 mg 166.7 mL/hr over 90 Minutes Intravenous Every 24 hours 09/15/20 1232 09/17/20  0956   09/15/20 1230  vancomycin (VANCOREADY) IVPB 1250 mg/250 mL        1,250 mg 166.7 mL/hr over 90 Minutes Intravenous  Once 09/15/20 1131 09/15/20 1402   09/13/20 1400  ceFEPIme (MAXIPIME) 2 g in sodium chloride 0.9 % 100 mL IVPB        2 g 200 mL/hr over 30 Minutes Intravenous Every 8 hours 09/13/20 1341 09/19/20 2208   09/06/20 0600  vancomycin (VANCOREADY) IVPB 1250 mg/250 mL        1,250 mg 166.7 mL/hr over 90 Minutes Intravenous  Once 09/06/20 0541 09/06/20 0739   09/05/20 1400  ceFAZolin (ANCEF) IVPB 1 g/50 mL premix  Status:  Discontinued        1 g 100 mL/hr over 30 Minutes Intravenous Every 8 hours 09/05/20 1120 09/13/20 1341   08/30/20 0600  vancomycin (VANCOREADY) IVPB 500 mg/100 mL  Status:  Discontinued        500 mg 100 mL/hr over 60 Minutes Intravenous Every 12 hours 08/29/20 1554 09/01/20 1115   08/29/20 1700  ceFEPIme (MAXIPIME) 2 g in sodium chloride 0.9 % 100 mL IVPB  Status:  Discontinued        2 g 200 mL/hr over 30 Minutes Intravenous Every 12 hours 08/29/20 1554 09/01/20 1115   08/29/20 1645  vancomycin (VANCOREADY) IVPB 1250 mg/250 mL        1,250 mg 166.7 mL/hr over 90 Minutes Intravenous  Once 08/29/20 1554 08/29/20 1820       Time spent: 35 minutes    Erin Hearing ANP  [facility] 7 am - 330 pm/M-F for direct patient care and secure chat Please refer to Amion for contact info 50  days

## 2020-10-16 NOTE — Consult Note (Signed)
Physical Medicine and Rehabilitation Consult Reason for Consult:Rehab assessment Referring Physician: Mahala MenghiniSamtani   HPI: Charlene CroakVivian T [name] is a 55 y.o. 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 IVC placed by Dr. Franky Machoabbell but continued to decline with obtundation and required intubation due to inability to protect airway. She was treated with hypertonic saline and IVC discontinued as nonfunctioning. 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 MRA brain negative for aneurysm.  She continued to have decreased responsiveness and required Trach/PEG placement 02/24. She was extubated to ATC and has been treated for HAP as well as sinusitis, periorbital cellulitis, anasarca as well as labile blood pressures. She is now able to oe now able to open eyes more frequently and attempting to move right hand. Is able to tolerate sitting at EOB with mod/max assist. Family has declined SNF and is requesting CIR for therapy.    Review of Systems  Unable to perform ROS: Patient unresponsive      Past Medical History:  Diagnosis Date  . Hypertension     Past Surgical History:  Procedure Laterality Date  . CESAREAN SECTION    . ESOPHAGOGASTRODUODENOSCOPY N/A 09/10/2020   Procedure: ESOPHAGOGASTRODUODENOSCOPY (EGD);  Surgeon: Sheliah HatchKinsinger, De BlanchLuke Aaron, MD;  [facility];  Service: General;  Laterality: N/A;  . PEG PLACEMENT N/A 09/10/2020   Procedure: PERCUTANEOUS ENDOSCOPIC GASTROSTOMY (PEG) PLACEMENT;  Surgeon: Sheliah HatchKinsinger, De BlanchLuke Aaron, MD;  Location: [facility] ENDOSCOPY;  Service: General;  Laterality: N/A;    Family History  Problem Relation Age of Onset  . Hypertension Father   . Cancer Maternal Grandmother   . Thyroid disease Paternal Uncle     Social History:  Married. 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 Per family she has never smoked, never used smokeless tobacco, does not drink alcohol and does not use drugs.    Allergies: No Known Allergies    Medications Prior to Admission  Medication Sig Dispense Refill  . acetaminophen (TYLENOL) 500 MG tablet Take 1,000 mg by mouth every 6 (six) hours as needed for mild pain.    . carvedilol (COREG) 6.25 MG tablet Take 1 tablet (6.25 mg total) by mouth 2 (two) times daily with a meal. 120 tablet 0  . hydrochlorothiazide (HYDRODIURIL) 25 MG tablet Take 1 tablet (25 mg total) by mouth daily. 60 tablet 0    Home: Home Living Family/patient expects to be discharged to:: Skilled nursing facility Living Arrangements: Spouse/significant other,Children Available Help at Discharge: Family  Functional History: Prior Function Level of Independence: Independent Comments: unsure of home set up, no family present in room and pt not verbalizing Functional Status:  Mobility: Bed Mobility Overal bed mobility: Needs Assistance Bed Mobility: Rolling,Supine to Sit,Sit to Supine Rolling: Total assist Supine to sit: Total assist,+2 for physical assistance Sit to supine: Total assist,+2 for physical assistance General bed mobility comments: Tot A +2 for SL to sit as well as return to SL. Pt rolled R and L for repositioning, not resistive to rolling. Maintains L head turn even with rolling R Transfers General transfer comment: unable Ambulation/Gait General Gait Details: unable    ADL: ADL General ADL Comments: total A  Cognition: Cognition Overall Cognitive Status: Difficult to assess Orientation Level: Intubated/Tracheostomy - Unable to assess Cognition Arousal/Alertness: Awake/alert Behavior During Therapy: Flat affect Overall Cognitive Status: Difficult to assess General Comments: eyes  partially open but gaze downward with no attempts to open eyes fully, patient continues to withdrawl with movement of L UE Difficult to assess  due to: Level of arousal   Blood pressure 134/82, pulse 98, temperature 99.1 F (37.3 C), temperature source Axillary, resp. rate 12, height 5\' 5"  (1.651 m), weight 66.7 kg, last menstrual period 03/03/2014, SpO2 99 %, unknown if currently breastfeeding. Physical Exam Constitutional:      Appearance: She is normal weight. She is ill-appearing.     Comments: Was able to open eyes during exam. ATC and left hand splint in place.   HENT:     Right Ear: External ear normal.     Left Ear: External ear normal.     Nose: Nose normal.     Mouth/Throat:     Mouth: Mucous membranes are moist.  Eyes:     Comments: Edematous right eye lid with ptosis. Fixed rightward gaze  Neck:     Comments: Neck rotated to right, can be passively rotated to neutral, #6 trach Cardiovascular:     Rate and Rhythm: Normal rate.     Pulses: Normal pulses.  Pulmonary:     Effort: Pulmonary effort is normal.  Abdominal:     Palpations: Abdomen is soft.     Comments: PET  Musculoskeletal:        General: No tenderness.     Cervical back: Rigidity present.  Skin:    General: Skin is warm.  Neurological:     Comments: Non verbal. Gaze fixed to the right and does not respond to threat in left field or really in the right for that matter. Was unable to stimulate any tracking. Did not respond to voice and minimally to sternal rub. Reacted most to PROM of LUE and LLE. Moves Right arm spontaneously and moves right leg with pain provocation. LUE with 1-2/4 flexor tone, LLE 1-2/4 extensor tone. Senses pain in RUE and RLE only. DTR's brisk throughout.      Results for orders placed or performed during the hospital encounter of 08/26/20 (from the past 24 hour(s))  Glucose, capillary     Status: Abnormal   Collection Time: 10/15/20 11:10 AM  Result Value Ref Range   Glucose-Capillary 161 (H) 70 - 99 mg/dL  Glucose, capillary     Status: Abnormal   Collection Time: 10/15/20  3:04 PM  Result Value Ref Range    Glucose-Capillary 123 (H) 70 - 99 mg/dL  Glucose, capillary     Status: Abnormal   Collection Time: 10/15/20  8:27 PM  Result Value Ref Range   Glucose-Capillary 127 (H) 70 - 99 mg/dL  Glucose, capillary     Status: Abnormal   Collection Time: 10/16/20 12:03 AM  Result Value Ref Range   Glucose-Capillary 135 (H) 70 - 99 mg/dL  Glucose, capillary     Status: Abnormal   Collection Time: 10/16/20  3:59 AM  Result Value Ref Range   Glucose-Capillary 129 (H) 70 - 99 mg/dL  Procalcitonin     Status: None   Collection Time: 10/16/20  4:12 AM  Result Value Ref Range   Procalcitonin <0.10 ng/mL  Glucose, capillary     Status: Abnormal   Collection Time: 10/16/20  7:30 AM  Result Value Ref Range   Glucose-Capillary 123 (H) 70 - 99 mg/dL   Comment 1 Notify RN    Comment 2 Document in Chart    CT CHEST WO CONTRAST  Result Date: 10/14/2020 CLINICAL DATA:  Pneumonia. EXAM:  CT CHEST WITHOUT CONTRAST TECHNIQUE: Multidetector CT imaging of the chest was performed following the standard protocol without IV contrast. COMPARISON:  None. FINDINGS: Cardiovascular: No significant vascular findings. Normal heart size. No pericardial effusion. Mediastinum/Nodes: Tracheostomy tube is in grossly good position. The esophagus is unremarkable. No adenopathy is noted. Thyroid gland is unremarkable. Lungs/Pleura: No pneumothorax or pleural effusion is noted. Mild biapical scarring is noted. Left lung is clear. There appears to be aspirated material within the right lower lobe bronchi with surrounding opacity concerning for aspiration pneumonia Upper Abdomen: No acute abnormality. Musculoskeletal: No chest wall mass or suspicious bone lesions identified. IMPRESSION: Probable aspirated material within the right lower lobe bronchi with surrounding opacity in the right lower lobe concerning for aspiration pneumonia. Electronically Signed   By: Lupita Raider M.D.   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 Does the need for close, 24 hr/day medical supervision in concert with the patient's rehab needs make it unreasonable for this patient to be served in a less intensive setting? No and Potentially 2. Co-Morbidities requiring supervision/potential complications: HTN, chronic headaches 3. Due to bladder management, bowel management, safety, skin/wound care, disease management, medication administration, pain management and patient education, does the patient require 24 hr/day rehab nursing? No and Potentially 4. Does the patient require coordinated care of a physician, rehab nurse, therapy disciplines of PT, OT, SLP to address physical and functional deficits in the context of the above medical diagnosis(es)? No and Potentially Addressing deficits in the following areas: balance, endurance, locomotion, strength, transferring, bowel/bladder control, bathing, dressing, feeding, grooming, toileting, cognition, speech, language, swallowing and psychosocial support 5. Can the patient actively participate in an intensive therapy program of at least 3 hrs of therapy per day at least 5 days per week? No 6. The potential for patient to make measurable gains while on inpatient rehab is poor 7. Anticipated functional outcomes upon discharge from inpatient rehab are max assist and n/a  with PT, max assist and n/a with OT, mod assist and n/a with SLP. 8. Estimated rehab length of stay to reach the above functional goals is: see below 9. Anticipated discharge destination: see below 10. Overall Rehab/Functional Prognosis: fair and poor  RECOMMENDATIONS: This patient's condition is appropriate for continued rehabilitative care in the following setting: see below Patient has agreed to participate in recommended program. N/A Note that insurance prior authorization may be required for reimbursement  for recommended care.  Comment: I spent an extensive amount of time with patient and daughter discussing Charlene [name]'s clinical situation, rehab potential etc. I expressed to daughter that at her current level she is unable to participate in an inpatient rehab program such as ours. She is most appropriate for SNF, but family does not want to pursue this venue of care. We discussed that given her cerebral edema, multiple insults, and scattered locations of these insults that her recovery will be prolonged and that even after that recovery she will require substantial assistance. Furthermore, she will be at risk for other complications such as the pneumonia she is currently being treated for.   While she's here for ongoing medical care, I'll go ahead and increase her provigil to 200mg  daily to see if we can wake her up. If this is ineffective, can consider a trial of amantadine and ritalin at the beginning of next week.   If we can see some kind of consistent interaction  or engagement with therapy during her hospital stay, I might consider a rehab admission. However, she'll have to demonstrate the ability to meaningfully participate in therapies and to show some carryover from day to day. If she goes home with family, extensive education will need to be provided.   Please contact me with any questions or concerns,   Thanks,   Ranelle Oyster, MD, [facility] Physical Medicine & Rehabilitation 10/16/2020   I have personally performed a face to face diagnostic evaluation of this patient and formulated the key components of the plan.  Additionally, I have personally reviewed laboratory data, imaging studies, as well as relevant notes and concur with the physician assistant's documentation above.      Jacquelynn Cree, PA-C 10/16/2020

## 2020-10-17 LAB — GLUCOSE, CAPILLARY
Glucose-Capillary: 137 mg/dL — ABNORMAL HIGH (ref 70–99)
Glucose-Capillary: 134 mg/dL — ABNORMAL HIGH (ref 70–99)
Glucose-Capillary: 131 mg/dL — ABNORMAL HIGH (ref 70–99)
Glucose-Capillary: 130 mg/dL — ABNORMAL HIGH (ref 70–99)
Glucose-Capillary: 133 mg/dL — ABNORMAL HIGH (ref 70–99)
Glucose-Capillary: 179 mg/dL — ABNORMAL HIGH (ref 70–99)

## 2020-10-17 LAB — CBC WITH DIFFERENTIAL/PLATELET
Abs Immature Granulocytes: 0.02 10*3/uL (ref 0.00–0.07)
Basophils Absolute: 0 10*3/uL (ref 0.0–0.1)
Basophils Relative: 1 %
Eosinophils Absolute: 0.1 10*3/uL (ref 0.0–0.5)
Eosinophils Relative: 1 %
HCT: 32.3 % — ABNORMAL LOW (ref 36.0–46.0)
Hemoglobin: 10.3 g/dL — ABNORMAL LOW (ref 12.0–15.0)
Immature Granulocytes: 0 %
Lymphocytes Relative: 39 %
Lymphs Abs: 3.3 10*3/uL (ref 0.7–4.0)
MCH: 26.3 pg (ref 26.0–34.0)
MCHC: 31.9 g/dL (ref 30.0–36.0)
MCV: 82.6 fL (ref 80.0–100.0)
Monocytes Absolute: 0.6 10*3/uL (ref 0.1–1.0)
Monocytes Relative: 7 %
Neutro Abs: 4.5 10*3/uL (ref 1.7–7.7)
Neutrophils Relative %: 52 %
Platelets: 346 10*3/uL (ref 150–400)
RBC: 3.91 MIL/uL (ref 3.87–5.11)
RDW: 16.8 % — ABNORMAL HIGH (ref 11.5–15.5)
WBC: 8.5 10*3/uL (ref 4.0–10.5)
nRBC: 0 % (ref 0.0–0.2)

## 2020-10-17 LAB — COMPREHENSIVE METABOLIC PANEL
ALT: 101 U/L — ABNORMAL HIGH (ref 0–44)
AST: 58 U/L — ABNORMAL HIGH (ref 15–41)
Albumin: 2.8 g/dL — ABNORMAL LOW (ref 3.5–5.0)
Alkaline Phosphatase: 102 U/L (ref 38–126)
Anion gap: 9 (ref 5–15)
BUN: 17 mg/dL (ref 6–20)
CO2: 25 mmol/L (ref 22–32)
Calcium: 9.3 mg/dL (ref 8.9–10.3)
Chloride: 103 mmol/L (ref 98–111)
Creatinine, Ser: 0.65 mg/dL (ref 0.44–1.00)
GFR, Estimated: >60 mL/min (ref >60)
Glucose, Bld: 122 mg/dL — ABNORMAL HIGH (ref 70–99)
Potassium: 4.2 mmol/L (ref 3.5–5.1)
Sodium: 137 mmol/L (ref 135–145)
Total Bilirubin: 0.5 mg/dL (ref 0.3–1.2)
Total Protein: 7.5 g/dL (ref 6.5–8.1)

## 2020-10-17 MED ORDER — POTASSIUM CHLORIDE 20 MEQ PO PACK
40.0000 meq | PACK | Freq: Two times a day (BID) | ORAL | Status: DC
  Administered 2020-10-17: 40 meq
  Filled 2020-10-17: qty 2

## 2020-10-17 NOTE — Progress Notes (Signed)
Pharmacy Antibiotic Note  Charlene [name] is a 55 y.o. female admitted on 08/26/2020 with ICH with resultant hydrocephalus on 08/27/20; S/P trache/peg on 09/10/20.  Pt's hospital course has been complicated by persistent pansinusitis, Serratia pneumonia and peri-orbital cellulitis and recurrent fevers. Pt has been on ciprofloxacin from 3/24-3/30 and continues to have yellow-green drainage likely from sinuses. Pharmacy has been consulted for Unasyn dosing for aspiration pneumonia.  Abx for sepsis > sinusitis + copious purulent nasal drainage. Tmax 100.1, WBC wnl  Completed course of cefazolin for periorbital cellulitis SCr 0.65, CrCl 73.2 ml/min (renal function stable)  Plan: Continue Unasyn 3 gm IV Q 6 hrs Monitor WBC, temp, clinical improvement, renal function, culture results.  Height: 5\' 5"  (165.1 cm) Weight: 66.7 kg (147 lb 0.8 oz) IBW/kg (Calculated) : 57  Temp (24hrs), Avg:99.1 F (37.3 C), Min:98.6 F (37 C), Max:100.1 F (37.8 C)  Recent Labs  Lab 10/12/20 1226 10/15/20 0309 10/17/20 0328  WBC 7.6  --  8.5  CREATININE 0.79 0.80 0.65    Estimated Creatinine Clearance: 73.2 mL/min (by C-G formula based on SCr of 0.65 mg/dL).    No Known Allergies  Antimicrobials this admission: Vanc 2/12 >>2/15, x1 2/20 3/1>> 3/3 Cefepime 2/12 >>2/15; restart 2/27 >> (3/5) Cefazolin 2/19 >> 2/27 Cipro 3/24>>3/30 Unasyn 3/30>>  Microbiology results: See Epic, due to >1 month stay this admission Most recent cx:  3/1 blood>> negative  3/1 Resp Cx (TA) > few serratia (likely residual - still treating thru 3/5) 3/18 BCx x2: negative  3/21 TA : serratia marcescens: pan sens except R to cefazolin.    Thank you for allowing pharmacy to be a part of this patient's care.  4/21, RPh Clinical Pharmacist [phone] Please check AMION for all [facility] Pharmacy phone numbers After 10:00 PM, call Main Pharmacy [phone] 10/17/2020 12:31 PM

## 2020-10-17 NOTE — Plan of Care (Signed)
  Problem: Nutrition: Goal: Risk of aspiration will decrease Outcome: Progressing Goal: Dietary intake will improve Outcome: Progressing   Problem: Intracerebral Hemorrhage Tissue Perfusion: Goal: Complications of Intracerebral Hemorrhage will be minimized Outcome: Progressing   Problem: Clinical Measurements: Goal: Will remain free from infection Outcome: Progressing Goal: Diagnostic test results will improve Outcome: Progressing Goal: Respiratory complications will improve Outcome: Progressing Goal: Cardiovascular complication will be avoided Outcome: Progressing   Problem: Nutrition: Goal: Adequate nutrition will be maintained Outcome: Progressing

## 2020-10-17 NOTE — Progress Notes (Signed)
[facility] PROGRESS NOTE  Charlene [name] PHX:[id number] DOB: [birth year] DOA: 08/26/2020 PCP: Patient, [name] (Inactive)    4/2   Status: Remains inpatient appropriate because:Altered mental status, Unsafe d/c plan, IV treatments appropriate due to intensity of illness or inability to take PO and Inpatient level of care appropriate due to severity of illness   Dispo: The patient is from: Home              Anticipated d/c is to: SNF-family does not wish to place patient in SNF.  If this is the only option they would like to consider taking the patient home although this may be a difficult undertaking given current requirement for trach and understandable fears about managing such complex patient in the home environment.  They have been updated as to why this patient currently is not a candidate for aggressive rehabilitative therapies like would be provided at a brain injury recovery rehab.  **3/24 Husband clarified that he has not delegated POA status to other family members.  **3/25 LCSW called patient's husband at home.  He confirmed that he is okay with daughter managing the Medicaid and disability process and has agreed to change the address to the one requested by the daughter.  Also a letter was requested by daughter to clarify this process and this has been placed in the chart. **3/31 power of attorney documentation has now been placed on the patient's chart.  Documentation stated for Dec 04, 2018              Patient currently is not medically stable to d/c.   Difficult to place patient Yes   Level of care: Progressive  Code Status: Full Family Communication:  extensive conversation via telephone with [name] [phone]) at bedside on 4/1.; [name] [phone]) is her POA and legal contact.   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 Subsequently on 2/10, she developed obtundation and respiratory arrest leading to intubation. She has remained persistently obtunded, now has a trach and a PEG. Hospital course complicated by persistent fever,Serratia pneumoniaand now pre-orbital cellulitis  As of 3/25 with adjustments and hypertonicity medications patient seems to be improving.  She is also waking up more although limited interaction but both eyes are now open and she tracks with the left eye.  Multiple discussions with husband who has confirmed he has not delegated other family members to be POA and he is the primary legal contact for patient.  He has agreed to let patient's daughter take the lead on completing and following up on Medicaid and disability applications including having these applications sent to her address.  2/09 Admit for [place] 2/10 EVD placed, later intubated due to obtundation and respiratory arrest 2/11 MRI Brain, hypertonic saline d/c'd, EVD not working. Husband did not consent to PICC  2/13 EVD removed 2/17 [place] discussion with family, PMT 2/24 Bedside trach and PEG 3/1 Transferred to [facility]  Subjective: Sitting up in the room Not very interactive reacts to menance by blinking  otherwise cannot really interact with me  Objective: Vitals:   10/17/20 1124 10/17/20 1200  BP:  (!) 143/89  Pulse: 92 94  Resp: (!) 23 19  Temp:  98.9 F (37.2 C)  SpO2: 99% 100%    Intake/Output Summary (Last 24 hours) at 10/17/2020 1713 Last data filed at 10/17/2020 0730 Gross per 24 hour  Intake 1987.45 ml  Output 2025 ml  Net  -37.55 ml   Filed Weights   10/14/20 0315 10/15/20 0323 10/16/20 0403  Weight: 66.6 kg 65.8 kg 66.7 kg    Exam:  Constitutional: Awake, no acute distress Respiratory: #6.0 cuffless trach, trach collar with FiO2 21% Cardiovascular: S1 and S2 NSR at this time Abdomen:  PEG tube in place abdomen soft nontender no rebound cannot appreciate organomegaly  Musculoskeletal: Torticollis noted Neurologic: Secretions significant Patient unable to raise arms off bed Psychiatric: Awake, is not interactive during exam.  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 Intraventricular penetration with blood filling the 3rd and forth ventricles and nearly filling the right lateral ventricle.  -MRI 2/11 with intraparenchymal hemorrhage centered within the right thalamus and basal ganglia extending into the right midbrain with intraventricular extension. Effacement of prepontine cistern and similar 4 mm of leftward midline shift. Separate areas of ristricted diffusion and edema involving the right greater than left mesial temporal lobes / parahippocampal cortex, bilateral basal ganglia, and possibly the upper pons that are concerning for acute infarcts, most likely 2/2 mass effect from the hemorrhage (see report)/ Per neuro "scattered b/l hippocampus, posterior CC, bilateral thalamus, and upper pons, likely due to deep penetrating vessel compression by focal mass-effect".  -2/19 MRI with acute to early subacute infarcts in the bilateral cerebral white matter and right cerebellum, new from 2/11 - interval evolution of multiple other infarcts present on prior MRI, increased hemorrhage along old right EVD tract (see report) - 2/21 head CT with expected evolution of  ICH since 2/13 - Echo with normal EF, no intracardiac source of embolism - CTA head/neck without intracranial arterial occlusion or high grade stenosis, no dissection, aneurysm, or hemodynamically significant stenosis of the carotid or vertebral arteries - Head CT 3/9 with expected evolution of hemorrhage involving the right thalamus, basal ganglia, and adjacent white matter from prior exam with developing encephalomalacia.  Resolved previous intraventricular hemorrhage.  Persistent sulcal effacement with mild improvement.  Persistent 3 mm left to right midline shift.  Multiple areas of low attenuation consistent with infarcts within the central white matter, unchanged in distribution from prior.  (see report) - Per neurology- see last follow up note from 2/25 - [place] likely 2/2 hypertension and nonadherence to meds  - s/p trach/peg on 2/24  - no antithrombotic 2/2 ICH -Obstructive hydrocephalus -> s/p external ventricular catheter placement on 08/27/20 by neurosurgery - EVD clamped on 2/12 and d/c'd on 2/13 -EEG suggestive of cortical dysfunction in right hemisphere likely 2/2 underlying bleed as well as severe diffuse encephalopathy, nonspecific etiology . -Continue baclofen 5 mg at 8 AM and 4 PM. -Continue baclofen 7.5 mg at bedtime -continue to follow LFTs.  Please note that patient has some stable baseline nonobstructive transaminitis. -3/22 EEG no seizures and persistent encephalopathy -Prognosis remains guarded and it is highly doubtful she will return to previous level of functioning therefore financial counseling notified to  pursue disability application -Cont Provigil -Add scheduled Senokot at HS given requirement for SMRs -Appreciate CIR input 4/2 and their education of family with regards to prognostication  Chronic pansinusitis/recurrent fevers 2/2 aspiration PNA;History of Serratia Pneumonia  Hospital Acquired Pneumonia:  -Tracheal aspirate from 2/25 and 3/1 with serratia - cefepime  2/27-3/5; repeat sputum cx 3/21 + serratia (likely colonization but w/ recurrent fever opted to treat) -Has completed antibiotics recently for bilateral orbital cellulitis -started on Cipro for tracheal aspirate positive for Serratia marcescens with last dose due 3/31(see below re change in anbxs 2/2 asp PNA)-CRP slightly elevated at 1.4.   -Repeat sinus CT demonstrates progressive complete bilateral maxillary sinus opacification with unchanged complete left sphenoid sinus opacification and right sphenoid sinus fluid.This is reflective of chronic state and current antibiotics would cover regardless -CT Chest 3/30 with aspiration PNA-dtr updated at bedside by attending MD- Cipro changed to Unasyn IV-stop date 5 days total ending on 4/3 -no increased O2 needs  Neck Contracture/torticollis -Preference for keeping head turned to the left. -Continue baclofen as above -- continue PT/OT weekly per their recommendations given patient's inability to effectively participate at this juncture.  Nursing staff performing PROM activities.  Family also instructed and has been performing intermittently. PT to reevaluate early next week Patient not following commands consistently  Hypertensive emergency/Malignant HTN Resolved -Home medications include Coreg and HCTZ which have not been reordered -Continue metoprolol, hydralazine  and amlodipine-we will readd home dose Coreg probably in the next several days -Blood pressure much better controlled after hypertonicity and pain treated  Acute hypoxemic respiratory failure secondary to CVA requiring mechanical ventilation and tracheostomy tube -Stable on trach collar and has been transitioned to a #6 cuffless trach not safe to decannulate given significant secretions - -Appreciate trach team assistance in management  Bacterial Conjunctivitis/periorbital cellulitis - MRI 3/7 orbits with L periorbital cellulitis  -- CT head 3/9 with persistent subtotal  opacification of paranasal sinuses and mastoid air cells, bubbly lucency at base of R maxillary sinus (likely odontogenic in origin) - CT maxillofacial 3/12 - improving cellulitis  - s/p erythromycin ointment - completed course of Ancef   Dysphagia/Nutrition Status: Nutrition Problem: Inadequate oral intake Etiology: inability to eat Signs/Symptoms: NPO status Interventions: Tube feeding consisting of Osmolite 1.2 Cal at 55 cc/h; continue Prosource liquid 45 cc daily, free water 100 cc every 4 hours per tube; will also continue to follow CBGs regularly while on tube feedings and provide SSI Estimated body mass index is 24.47 kg/m as calculated from the following:   Height as of this encounter: '5\' 5"'  (1.651 m).   Weight as of this encounter: 66.7 kg. If patient eventually discharged to home to teach family how to bolus feed as opposed to utilizing continuous tube feeding infusions.   Other problems: Anasarca upper extremity US-> age indeterminate SVT of right cephalic vein Elevated LFT's Sinus Tachycardia   Data Reviewed: Basic Metabolic Panel: Recent Labs  Lab 10/12/20 1226 10/15/20 0309 10/15/20 0734 10/17/20 0328  NA 140 138  --  137  K 4.2 3.2*  --  4.2  CL 103 101  --  103  CO2 29 28  --  25  GLUCOSE 83 151*  --  122*  BUN 22* 23*  --  17  CREATININE 0.79 0.80  --  0.65  CALCIUM 9.6 9.0  --  9.3  MG  --   --  2.1  --    Liver Function Tests: Recent Labs  Lab 10/12/20 1226  10/15/20 0309 10/17/20 0328  AST 65* 34 58*  ALT 104* 64* 101*  ALKPHOS 105 91 102  BILITOT 0.4 0.4 0.5  PROT 7.3 6.6 7.5  ALBUMIN 2.8* 2.6* 2.8*   No results for input(s): LIPASE, AMYLASE in the last 168 hours. No results for input(s): AMMONIA in the last 168 hours. CBC: Recent Labs  Lab 10/12/20 1226 10/17/20 0328  WBC 7.6 8.5  NEUTROABS 4.8 4.5  HGB 9.9* 10.3*  HCT 32.2* 32.3*  MCV 84.5 82.6  PLT 384 346   Cardiac Enzymes: No results for input(s): CKTOTAL, CKMB,  CKMBINDEX, TROPONINI in the last 168 hours. BNP (last 3 results) No results for input(s): BNP in the last 8760 hours.  ProBNP (last 3 results) No results for input(s): PROBNP in the last 8760 hours.  CBG: Recent Labs  Lab 10/16/20 1931 10/16/20 2337 10/17/20 0338 10/17/20 0824 10/17/20 1201  GLUCAP 129* 126* 137* 134* 131*    No results found for this or any previous visit (from the past 240 hour(s)).   Studies: No results found.  Scheduled Meds: . amLODipine  5 mg Per Tube Daily  . atorvastatin  40 mg Per Tube Daily  . baclofen  5 mg Per Tube 2 times per day  . baclofen  7.5 mg Per Tube Daily  . chlorhexidine gluconate (MEDLINE KIT)  15 mL Mouth Rinse BID  . Chlorhexidine Gluconate Cloth  6 each Topical Daily  . enoxaparin (LOVENOX) injection  40 mg Subcutaneous Q24H  . feeding supplement (PROSource TF)  45 mL Per Tube Daily  . free water  100 mL Per Tube Q4H  . hydrALAZINE  50 mg Per Tube Q8H  . insulin aspart  0-9 Units Subcutaneous Q4H  . mouth rinse  15 mL Mouth Rinse 5 X Daily  . metoprolol tartrate  100 mg Per Tube BID  . modafinil  200 mg Per Tube Daily  . pantoprazole sodium  40 mg Per Tube BID  . potassium chloride  40 mEq Per Tube BID  . scopolamine  1 patch Transdermal Q72H  . sennosides  5 mL Per Tube QHS   Continuous Infusions: . sodium chloride Stopped (09/28/20 1344)  . ampicillin-sulbactam (UNASYN) IV 3 g (10/17/20 1217)  . feeding supplement (OSMOLITE 1.2 CAL) 55 mL/hr at 10/17/20 0400    Principal Problem:   ICH (intracerebral hemorrhage) (HCC) Active Problems:   Intraparenchymal hemorrhage of brain (HCC)   Acute hypoxemic respiratory failure (HCC)   Fever   Endotracheal tube present   Status post tracheostomy ([place])   Palliative care by specialist   Muscle hypertonicity   Oropharyngeal dysphagia   Brain injury with loss of consciousness ([place])   Poor prognosis   Aspiration pneumonia of right lower lobe  [facility])   Consultants:  Palliative care  Pulm  Neurology   Procedures:  Tracheostomy  PEG tube  EEG  Echocardiogram  Antibiotics: Anti-infectives (From admission, onward)   Start     Dose/Rate Route Frequency Ordered Stop   10/14/20 1815  Ampicillin-Sulbactam (UNASYN) 3 g in sodium chloride 0.9 % 100 mL IVPB        3 g 200 mL/hr over 30 Minutes Intravenous Every 6 hours 10/14/20 1754     10/08/20 0845  ciprofloxacin (CIPRO) tablet 500 mg  Status:  Discontinued        500 mg Per Tube 2 times daily 10/08/20 0754 10/14/20 1731   09/21/20 1545  ceFAZolin (ANCEF) IVPB 1 g/50 mL premix  1 g 100 mL/hr over 30 Minutes Intravenous Every 8 hours 09/21/20 1457 09/28/20 0647   09/20/20 0600  ceFAZolin (ANCEF) IVPB 1 g/50 mL premix  Status:  Discontinued        1 g 100 mL/hr over 30 Minutes Intravenous Every 8 hours 09/14/20 0903 09/14/20 0904   09/16/20 1300  vancomycin (VANCOREADY) IVPB 1250 mg/250 mL  Status:  Discontinued        1,250 mg 166.7 mL/hr over 90 Minutes Intravenous Every 24 hours 09/15/20 1232 09/17/20 0956   09/15/20 1230  vancomycin (VANCOREADY) IVPB 1250 mg/250 mL        1,250 mg 166.7 mL/hr over 90 Minutes Intravenous  Once 09/15/20 1131 09/15/20 1402   09/13/20 1400  ceFEPIme (MAXIPIME) 2 g in sodium chloride 0.9 % 100 mL IVPB        2 g 200 mL/hr over 30 Minutes Intravenous Every 8 hours 09/13/20 1341 09/19/20 2208   09/06/20 0600  vancomycin (VANCOREADY) IVPB 1250 mg/250 mL        1,250 mg 166.7 mL/hr over 90 Minutes Intravenous  Once 09/06/20 0541 09/06/20 0739   09/05/20 1400  ceFAZolin (ANCEF) IVPB 1 g/50 mL premix  Status:  Discontinued        1 g 100 mL/hr over 30 Minutes Intravenous Every 8 hours 09/05/20 1120 09/13/20 1341   08/30/20 0600  vancomycin (VANCOREADY) IVPB 500 mg/100 mL  Status:  Discontinued        500 mg 100 mL/hr over 60 Minutes Intravenous Every 12 hours 08/29/20 1554 09/01/20 1115   08/29/20 1700  ceFEPIme (MAXIPIME) 2 g in  sodium chloride 0.9 % 100 mL IVPB  Status:  Discontinued        2 g 200 mL/hr over 30 Minutes Intravenous Every 12 hours 08/29/20 1554 09/01/20 1115   08/29/20 1645  vancomycin (VANCOREADY) IVPB 1250 mg/250 mL        1,250 mg 166.7 mL/hr over 90 Minutes Intravenous  Once 08/29/20 1554 08/29/20 1820       Time spent: [place], MD [facility] 5:19 PM  51  days

## 2020-10-18 LAB — GLUCOSE, CAPILLARY
Glucose-Capillary: 111 mg/dL — ABNORMAL HIGH (ref 70–99)
Glucose-Capillary: 122 mg/dL — ABNORMAL HIGH (ref 70–99)
Glucose-Capillary: 135 mg/dL — ABNORMAL HIGH (ref 70–99)
Glucose-Capillary: 139 mg/dL — ABNORMAL HIGH (ref 70–99)
Glucose-Capillary: 147 mg/dL — ABNORMAL HIGH (ref 70–99)
Glucose-Capillary: 154 mg/dL — ABNORMAL HIGH (ref 70–99)

## 2020-10-18 MED ORDER — METOPROLOL TARTRATE 25 MG/10 ML ORAL SUSPENSION
125.0000 mg | Freq: Two times a day (BID) | ORAL | Status: DC
  Administered 2020-10-18 – 2020-10-22 (×9): 125 mg
  Filled 2020-10-18 (×13): qty 50

## 2020-10-18 NOTE — Plan of Care (Signed)
  Problem: Nutrition: Goal: Risk of aspiration will decrease Outcome: Progressing Goal: Dietary intake will improve Outcome: Progressing   Problem: Clinical Measurements: Goal: Will remain free from infection Outcome: Progressing Goal: Diagnostic test results will improve Outcome: Progressing

## 2020-10-18 NOTE — Progress Notes (Signed)
[facility] PROGRESS NOTE  Charlene [name] MKJ:[id number] DOB: 1966/01/30 DOA: 08/26/2020 PCP: Patient, [name] (Inactive)    4/2   Status: Remains inpatient appropriate because:Altered mental status, Unsafe d/c plan, IV treatments appropriate due to intensity of illness or inability to take PO and Inpatient level of care appropriate due to severity of illness   Dispo: The patient is from: Home              Anticipated d/c is to: SNF-family does not wish to place patient in SNF.  If this is the only option they would like to consider taking the patient home although this may be a difficult undertaking given current requirement for trach and understandable fears about managing such complex patient in the home environment.  They have been updated as to why this patient currently is not a candidate for aggressive rehabilitative therapies like would be provided at a brain injury recovery rehab.  **3/24 Husband clarified that he has not delegated POA status to other family members.  **3/25 LCSW called patient's husband at home.  He confirmed that he is okay with daughter managing the Medicaid and disability process and has agreed to change the address to the one requested by the daughter.  Also a letter was requested by daughter to clarify this process and this has been placed in the chart. **3/31 power of attorney documentation has now been placed on the patient's chart.  Documentation stated for Dec 04, 2018              Patient currently is not medically stable to d/c.   Difficult to place patient Yes   Level of care: Progressive  Code Status: Full Family Communication:  extensive conversation via telephone with [name] [phone]) at bedside on 4/1.; [name] [phone]) is her POA and legal contact.   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 Subsequently on 2/10, she developed obtundation and respiratory arrest leading to intubation. She has remained persistently obtunded, now has a trach and a PEG. Hospital course complicated by persistent fever,Serratia pneumoniaand now pre-orbital cellulitis  As of 3/25 with adjustments and hypertonicity medications patient seems to be improving.  She is also waking up more although limited interaction but both eyes are now open and she tracks with the left eye.  Multiple discussions with husband who has confirmed he has not delegated other family members to be POA and he is the primary legal contact for patient.  He has agreed to let patient's daughter take the lead on completing and following up on Medicaid and disability applications including having these applications sent to her address.  2/09 Admit for [place] 2/10 EVD placed, later intubated due to obtundation and respiratory arrest 2/11 MRI Brain, hypertonic saline d/c'd, EVD not working. Husband did not consent to PICC  2/13 EVD removed 2/17 [place] discussion with family, PMT 2/24 Bedside trach and PEG 3/1 Transferred to [facility]  Subjective:  Sleepy this am No new issues per RN  Objective: Vitals:  10/18/20 0942 10/18/20 1102  BP: 116/71 (!) 148/93  Pulse:  94  Resp:  (!) 25  Temp:  98.8 F (37.1 C)  SpO2:  100%    Intake/Output Summary (Last 24 hours) at 10/18/2020 1449 Last data filed at 10/18/2020 1000 Gross per 24 hour  Intake --  Output 1200 ml  Net -1200 ml   Filed Weights   10/15/20 0323 10/16/20 0403 10/18/20 0500  Weight: 65.8 kg  66.7 kg 65.5 kg    Exam:  Constitutional: no acute distress, NO OVERT change to condition  Respiratory: #6.0 cuffless trach, trach collar with FiO2 21% Cardiovascular: S1 and S2 NSR at this time Abdomen:  PEG tube in place abdomen soft nontender no rebound cannot appreciate organomegaly  Musculoskeletal: Torticollis noted Neurologic: Secretions significant Patient unable to raise arms off bed Psychiatric: Awake, is not interactive during exam.  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 Intraventricular penetration with blood filling the 3rd and forth ventricles and nearly filling the right lateral ventricle.  -MRI 2/11 with intraparenchymal hemorrhage centered within the right thalamus and basal ganglia extending into the right midbrain with intraventricular extension. Effacement of prepontine cistern and similar 4 mm of leftward midline shift. Separate areas of ristricted diffusion and edema involving the right greater than left mesial temporal lobes / parahippocampal cortex, bilateral basal ganglia, and possibly the upper pons that are concerning for acute infarcts, most likely 2/2 mass effect from the hemorrhage (see report)/ Per neuro "scattered b/l hippocampus, posterior CC, bilateral thalamus, and upper pons, likely due to deep penetrating vessel compression by focal mass-effect".  -2/19 MRI with acute to early subacute infarcts in the bilateral cerebral white matter and right cerebellum, new from 2/11 - interval evolution of multiple other infarcts present on prior MRI, increased hemorrhage along old right EVD tract (see report) - 2/21 head CT with expected evolution of ICH since 2/13 - Echo with normal EF, no intracardiac source of  embolism - CTA head/neck without intracranial arterial occlusion or high grade stenosis, no dissection, aneurysm, or hemodynamically significant stenosis of the carotid or vertebral arteries - Head CT 3/9 with expected evolution of hemorrhage involving the right thalamus, basal ganglia, and adjacent white matter from prior exam with developing encephalomalacia.  Resolved previous intraventricular hemorrhage.  Persistent sulcal effacement with mild improvement.  Persistent 3 mm left to right midline shift.  Multiple areas of low attenuation consistent with infarcts within the central white matter, unchanged in distribution from prior.  (see report) - Per neurology- see last follow up note from 2/25 - [place] likely 2/2 hypertension and nonadherence to meds  - s/p trach/peg on 2/24  - no antithrombotic 2/2 ICH -Obstructive hydrocephalus -> s/p external ventricular catheter placement on 08/27/20 by neurosurgery - EVD clamped on 2/12 and d/c'd on 2/13 -EEG suggestive of cortical dysfunction in right hemisphere likely 2/2 underlying bleed as well as severe diffuse encephalopathy, nonspecific etiology . -Continue baclofen 5 mg at 8 AM and 4 PM. -Continue baclofen 7.5 mg at bedtime -continue to follow LFTs.  Please note that patient has some stable baseline nonobstructive transaminitis. -3/22 EEG no seizures and persistent encephalopathy -Prognosis remains guarded and it is highly doubtful she will return to previous level of functioning therefore financial counseling notified to pursue disability application -Cont Provigil -Add scheduled  Senokot at [facility] given requirement for SMRs -Appreciate CIR input 4/2 and their education of family with regards to prognostication  Chronic pansinusitis/recurrent fevers 2/2 aspiration PNA;History of Serratia Pneumonia  Hospital Acquired Pneumonia:  -Tracheal aspirate from 2/25 and 3/1 with serratia - cefepime 2/27-3/5; repeat sputum cx 3/21 + serratia (likely colonization  but w/ recurrent fever opted to treat) -Has completed antibiotics recently for bilateral orbital cellulitis -started on Cipro for tracheal aspirate positive for Serratia marcescens with last dose due 3/31(see below re change in anbxs 2/2 asp PNA)-CRP slightly elevated at 1.4.   -Repeat sinus CT demonstrates progressive complete bilateral maxillary sinus opacification with unchanged complete left sphenoid sinus opacification and right sphenoid sinus fluid.This is reflective of chronic state and current antibiotics would cover regardless -CT Chest 3/30 with aspiration PNA-dtr updated at bedside by attending MD- Cipro changed to Unasyn IV-stop date 5 days total ending on 4/3 -no increased O2 needs FOLLOW CXR AND LABS FORM 4/4---HAS CIRCUMSCRIBED ZOSYN ENDING 4/4, monitor for Fever curve   Neck Contracture/torticollis -Preference for keeping head turned to the left. -Continue baclofen as above -- continue PT/OT weekly per their recommendations given patient's inability to effectively participate at this juncture.  Nursing staff performing PROM activities.  Family also instructed and has been performing intermittently. PT to reevaluate early next week Patient not following commands consistently  Hypertensive emergency/Malignant HTN Resolved -Home medications HCTZ  held -Continue metoprolol [DOSE INCREASED TO 125 BID 4/4], hydralazine  and amlodipine -Blood pressure much better controlled after hypertonicity and pain treated  Acute hypoxemic respiratory failure secondary to CVA requiring mechanical ventilation and tracheostomy tube -Stable on trach collar and has been transitioned to a #6 cuffless trach not safe to decannulate given significant secretions -Appreciate trach team assistance in management  Bacterial Conjunctivitis/periorbital cellulitis - MRI 3/7 orbits with L periorbital cellulitis  -- CT head 3/9 with persistent subtotal opacification of paranasal sinuses and mastoid air cells,  bubbly lucency at base of R maxillary sinus (likely odontogenic in origin) - CT maxillofacial 3/12 - improving cellulitis  - s/p erythromycin ointment - completed course of Ancef   Dysphagia/Nutrition Status: Nutrition Problem: Inadequate oral intake Etiology: inability to eat Signs/Symptoms: NPO status Interventions: Tube feeding consisting of Osmolite 1.2 Cal at 55 cc/h; continue Prosource liquid 45 cc daily, free water 100 cc every 4 hours per tube CBG~ 130 Estimated body mass index is 24.03 kg/m as calculated from the following:   Height as of this encounter: '5\' 5"'  (1.651 m).   Weight as of this encounter: 65.5 kg. If patient eventually discharged to home to teach family how to bolus feed as opposed to utilizing continuous tube feeding infusions.   Other problems: Anasarca upper extremity US-> age indeterminate SVT of right cephalic vein Elevated LFT's Sinus Tachycardia   Data Reviewed: Basic Metabolic Panel: Recent Labs  Lab 10/12/20 1226 10/15/20 0309 10/15/20 0734 10/17/20 0328  NA 140 138  --  137  K 4.2 3.2*  --  4.2  CL 103 101  --  103  CO2 29 28  --  25  GLUCOSE 83 151*  --  122*  BUN 22* 23*  --  17  CREATININE 0.79 0.80  --  0.65  CALCIUM 9.6 9.0  --  9.3  MG  --   --  2.1  --    Liver Function Tests: Recent Labs  Lab 10/12/20 1226 10/15/20 0309 10/17/20 0328  AST 65* 34 58*  ALT 104* 64* 101*  ALKPHOS  105 91 102  BILITOT 0.4 0.4 0.5  PROT 7.3 6.6 7.5  ALBUMIN 2.8* 2.6* 2.8*   No results for input(s): LIPASE, AMYLASE in the last 168 hours. No results for input(s): AMMONIA in the last 168 hours. CBC: Recent Labs  Lab 10/12/20 1226 10/17/20 0328  WBC 7.6 8.5  NEUTROABS 4.8 4.5  HGB 9.9* 10.3*  HCT 32.2* 32.3*  MCV 84.5 82.6  PLT 384 346   Cardiac Enzymes: No results for input(s): CKTOTAL, CKMB, CKMBINDEX, TROPONINI in the last 168 hours. BNP (last 3 results) No results for input(s): BNP in the last 8760 hours.  ProBNP (last 3  results) No results for input(s): PROBNP in the last 8760 hours.  CBG: Recent Labs  Lab 10/17/20 1945 10/17/20 2326 10/18/20 0336 10/18/20 0743 10/18/20 1144  GLUCAP 133* 179* 111* 139* 135*    No results found for this or any previous visit (from the past 240 hour(s)).   Studies: No results found.  Scheduled Meds: . amLODipine  5 mg Per Tube Daily  . atorvastatin  40 mg Per Tube Daily  . baclofen  5 mg Per Tube 2 times per day  . baclofen  7.5 mg Per Tube Daily  . chlorhexidine gluconate (MEDLINE KIT)  15 mL Mouth Rinse BID  . Chlorhexidine Gluconate Cloth  6 each Topical Daily  . enoxaparin (LOVENOX) injection  40 mg Subcutaneous Q24H  . feeding supplement (PROSource TF)  45 mL Per Tube Daily  . free water  100 mL Per Tube Q4H  . hydrALAZINE  50 mg Per Tube Q8H  . insulin aspart  0-9 Units Subcutaneous Q4H  . mouth rinse  15 mL Mouth Rinse 5 X Daily  . metoprolol tartrate  100 mg Per Tube BID  . modafinil  200 mg Per Tube Daily  . pantoprazole sodium  40 mg Per Tube BID  . scopolamine  1 patch Transdermal Q72H  . sennosides  5 mL Per Tube QHS   Continuous Infusions: . sodium chloride Stopped (09/28/20 1344)  . ampicillin-sulbactam (UNASYN) IV 3 g (10/18/20 1233)  . feeding supplement (OSMOLITE 1.2 CAL) 1,000 mL (10/18/20 0411)    Principal Problem:   ICH (intracerebral hemorrhage) (HCC) Active Problems:   Intraparenchymal hemorrhage of brain (HCC)   Acute hypoxemic respiratory failure (HCC)   Fever   Endotracheal tube present   Status post tracheostomy ([place])   Palliative care by specialist   Muscle hypertonicity   Oropharyngeal dysphagia   Brain injury with loss of consciousness ([place])   Poor prognosis   Aspiration pneumonia of right lower lobe [facility])   Consultants:  Palliative care  Pulm  Neurology   Procedures:  Tracheostomy  PEG tube  EEG  Echocardiogram  Antibiotics: Anti-infectives (From admission, onward)   Start     Dose/Rate  Route Frequency Ordered Stop   10/14/20 1815  Ampicillin-Sulbactam (UNASYN) 3 g in sodium chloride 0.9 % 100 mL IVPB        3 g 200 mL/hr over 30 Minutes Intravenous Every 6 hours 10/14/20 1754 10/19/20 1814   10/08/20 0845  ciprofloxacin (CIPRO) tablet 500 mg  Status:  Discontinued        500 mg Per Tube 2 times daily 10/08/20 0754 10/14/20 1731   09/21/20 1545  ceFAZolin (ANCEF) IVPB 1 g/50 mL premix        1 g 100 mL/hr over 30 Minutes Intravenous Every 8 hours 09/21/20 1457 09/28/20 0647   09/20/20 0600  ceFAZolin (ANCEF) IVPB 1  g/50 mL premix  Status:  Discontinued        1 g 100 mL/hr over 30 Minutes Intravenous Every 8 hours 09/14/20 0903 09/14/20 0904   09/16/20 1300  vancomycin (VANCOREADY) IVPB 1250 mg/250 mL  Status:  Discontinued        1,250 mg 166.7 mL/hr over 90 Minutes Intravenous Every 24 hours 09/15/20 1232 09/17/20 0956   09/15/20 1230  vancomycin (VANCOREADY) IVPB 1250 mg/250 mL        1,250 mg 166.7 mL/hr over 90 Minutes Intravenous  Once 09/15/20 1131 09/15/20 1402   09/13/20 1400  ceFEPIme (MAXIPIME) 2 g in sodium chloride 0.9 % 100 mL IVPB        2 g 200 mL/hr over 30 Minutes Intravenous Every 8 hours 09/13/20 1341 09/19/20 2208   09/06/20 0600  vancomycin (VANCOREADY) IVPB 1250 mg/250 mL        1,250 mg 166.7 mL/hr over 90 Minutes Intravenous  Once 09/06/20 0541 09/06/20 0739   09/05/20 1400  ceFAZolin (ANCEF) IVPB 1 g/50 mL premix  Status:  Discontinued        1 g 100 mL/hr over 30 Minutes Intravenous Every 8 hours 09/05/20 1120 09/13/20 1341   08/30/20 0600  vancomycin (VANCOREADY) IVPB 500 mg/100 mL  Status:  Discontinued        500 mg 100 mL/hr over 60 Minutes Intravenous Every 12 hours 08/29/20 1554 09/01/20 1115   08/29/20 1700  ceFEPIme (MAXIPIME) 2 g in sodium chloride 0.9 % 100 mL IVPB  Status:  Discontinued        2 g 200 mL/hr over 30 Minutes Intravenous Every 12 hours 08/29/20 1554 09/01/20 1115   08/29/20 1645  vancomycin (VANCOREADY) IVPB 1250  mg/250 mL        1,250 mg 166.7 mL/hr over 90 Minutes Intravenous  Once 08/29/20 1554 08/29/20 1820       Time spent: [place], MD [facility] 2:49 PM  52  days

## 2020-10-19 ENCOUNTER — Inpatient Hospital Stay (HOSPITAL_COMMUNITY): Payer: Medicaid Other

## 2020-10-19 LAB — COMPREHENSIVE METABOLIC PANEL
ALT: 78 U/L — ABNORMAL HIGH (ref 0–44)
AST: 45 U/L — ABNORMAL HIGH (ref 15–41)
Albumin: 2.6 g/dL — ABNORMAL LOW (ref 3.5–5.0)
Alkaline Phosphatase: 93 U/L (ref 38–126)
Anion gap: 11 (ref 5–15)
BUN: 20 mg/dL (ref 6–20)
CO2: 27 mmol/L (ref 22–32)
Calcium: 9.3 mg/dL (ref 8.9–10.3)
Chloride: 102 mmol/L (ref 98–111)
Creatinine, Ser: 0.69 mg/dL (ref 0.44–1.00)
GFR, Estimated: >60 mL/min (ref >60)
Glucose, Bld: 136 mg/dL — ABNORMAL HIGH (ref 70–99)
Potassium: 4.1 mmol/L (ref 3.5–5.1)
Sodium: 140 mmol/L (ref 135–145)
Total Bilirubin: 0.4 mg/dL (ref 0.3–1.2)
Total Protein: 7.1 g/dL (ref 6.5–8.1)

## 2020-10-19 LAB — GLUCOSE, CAPILLARY
Glucose-Capillary: 138 mg/dL — ABNORMAL HIGH (ref 70–99)
Glucose-Capillary: 151 mg/dL — ABNORMAL HIGH (ref 70–99)
Glucose-Capillary: 162 mg/dL — ABNORMAL HIGH (ref 70–99)
Glucose-Capillary: 112 mg/dL — ABNORMAL HIGH (ref 70–99)
Glucose-Capillary: 124 mg/dL — ABNORMAL HIGH (ref 70–99)
Glucose-Capillary: 122 mg/dL — ABNORMAL HIGH (ref 70–99)

## 2020-10-19 IMAGING — DX DG CHEST 1V PORT
1 series · 1 of 1 positions shown · non-contrast
Comparison: [DATE]

CLINICAL DATA: Hospital-acquired pneumonia

EXAM:
PORTABLE CHEST 1 VIEW

[chest ap]
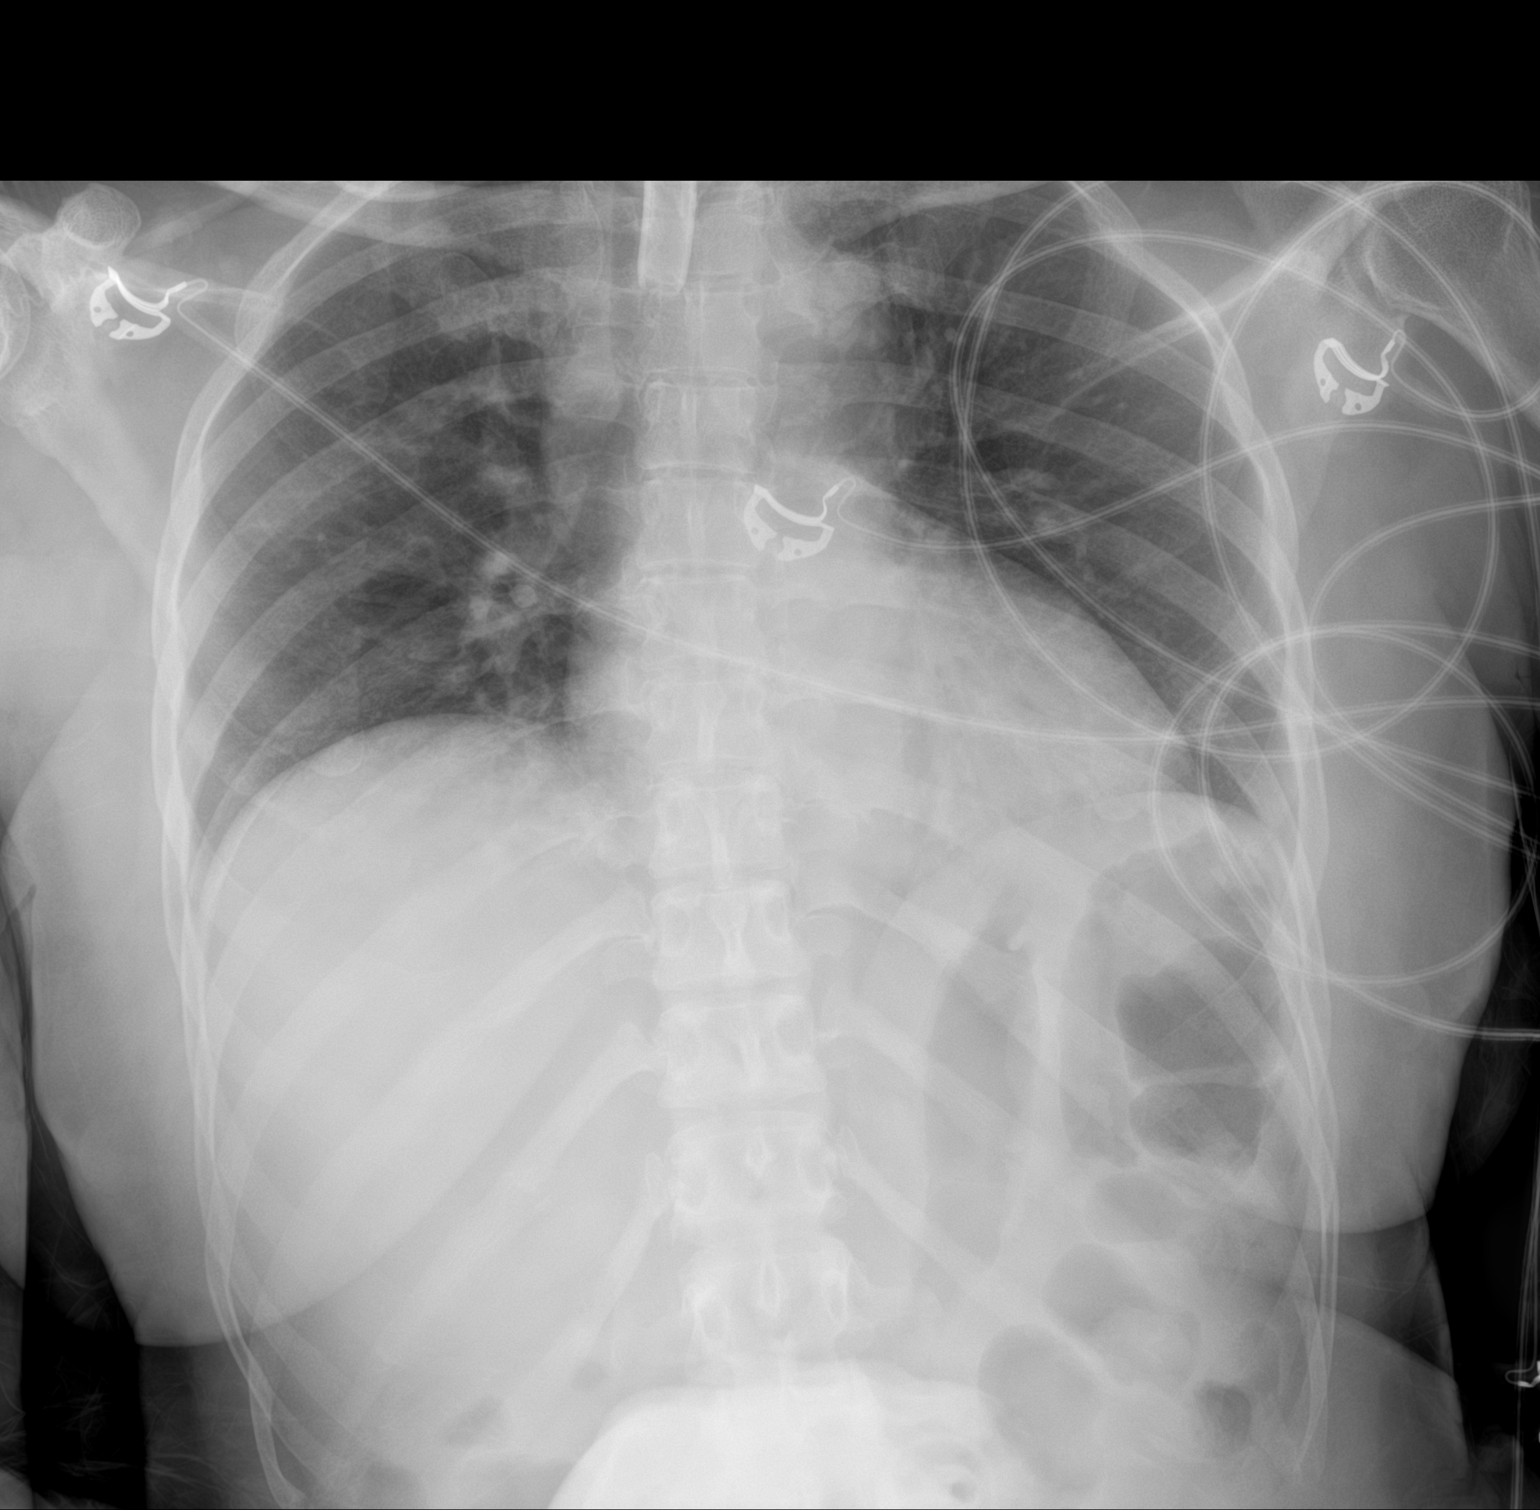

[1 of 1 positions shown; findings below may reference images not displayed]

FINDINGS: Tracheostomy cannula is unchanged in position. Low depth of
inspiration limits evaluation of the lungs. There is mild patchy
opacity at the left medial lung base which is favored to be
atelectasis rather than pneumonia. The lungs are otherwise clear.
IMPRESSION: Mild patchy opacity at the left lung base favored to be atelectasis
rather than pneumonia.

## 2020-10-19 NOTE — Plan of Care (Signed)
  Problem: Nutrition: Goal: Risk of aspiration will decrease Outcome: Progressing   Problem: Intracerebral Hemorrhage Tissue Perfusion: Goal: Complications of Intracerebral Hemorrhage will be minimized Outcome: Progressing   Problem: Clinical Measurements: Goal: Ability to maintain clinical measurements within normal limits will improve Outcome: Progressing Goal: Diagnostic test results will improve Outcome: Progressing Goal: Respiratory complications will improve Outcome: Progressing Goal: Cardiovascular complication will be avoided Outcome: Progressing   Problem: Nutrition: Goal: Adequate nutrition will be maintained Outcome: Progressing   Problem: Elimination: Goal: Will not experience complications related to urinary retention Outcome: Progressing   Problem: Pain Managment: Goal: General experience of comfort will improve Outcome: Progressing   Problem: Skin Integrity: Goal: Risk for impaired skin integrity will decrease Outcome: Progressing

## 2020-10-19 NOTE — Progress Notes (Signed)
[facility] night shift.  The nursing staff reported ordered that the patient is unable to get a 2 view chest radiograph at this time due to generalized weakness.  A single view portable chest radiograph has been ordered.  Sanda Klein, MD.

## 2020-10-19 NOTE — Progress Notes (Signed)
Nutrition Follow-up  DOCUMENTATION CODES:  Not applicable  INTERVENTION:  Continue Tube feeding via PEG tube: Osmolite 1.2 at 55 ml/h (1320 ml per day) 45 ml ProSource TF daily  Provides 1624 kcal, 84 gm protein, 1070 ml free water daily  100 ml free water every 4 hours    NUTRITION DIAGNOSIS:  Inadequate oral intake related to inability to eat as evidenced by NPO status. -- ongoing  GOAL:  Patient will meet greater than or equal to 90% of their needs -- Met with TF  MONITOR:  TF tolerance,Vent status  REASON FOR ASSESSMENT:  Consult,Ventilator Enteral/tube feeding initiation and management  ASSESSMENT:  Pt with PMH of HTN with medication noncompliance admitted with hypertensive thalamic ICH.  2/10 s/p EVD placement and intubation for respiratory arrest 2/13 EVD removed 2/17 palliative care consult; family requesting tx to [facility]  2/24 s/p trach and PEG  3/1 tx to [facility] 3/11 trach changed to shiley #6 cuffless  Pt awake and able to track around room, though noted to be tracking primarily to sound. Pt does not make any attempts to follow commands. Per MD, pt continues to be pending discharge.   Pt continues to receive TF via PEG. Current regimen: Osmolite 1.2 at 55 ml/h w/ 45 ml ProSource TF daily, 14m free water flushes Q4H. Tolerating well per RN.   UOP: 11579mdocumented today  Admit wt: 61.2 kg Current wt: 67.7 kg  non-pitting edema noted to BUE per RN assessment  Medications: SSI Q4H, protonix, senokot Labs reviewed. CBGs [id number] Order:   Diet Order            Diet NPO time specified  Diet effective midnight                 EDUCATION NEEDS:   No education needs have been identified at this time  Skin:  Skin Assessment: Reviewed RN Assessment  Last BM:  4/2  Height:   Ht Readings from Last 1 Encounters:  10/18/20 '5\' 5"'  (1.651 m)    Weight:   Wt Readings from Last 1 Encounters:  10/18/20 67.7 kg    Ideal Body Weight:   56.8 kg  BMI:  Body mass index is 24.84 kg/m.  Estimated Nutritional Needs:   Kcal:  1600-1800  Protein:  80-90 grams  Fluid:  >1.6L/d    AmLarkin InaMS, RD, LDN RD pager number and weekend/on-call pager number located in [facility]

## 2020-10-19 NOTE — Progress Notes (Signed)
[facility] PROGRESS NOTE  Charlene [name] NIO:[id number] DOB: [birth year] DOA: 08/26/2020 PCP: Patient, [name] (Inactive)    October 08, 2020     10/16/2020                        10/16/2020   10/18/2020    Status: Remains inpatient appropriate because:Altered mental status, Unsafe d/c plan, IV treatments appropriate due to intensity of illness or inability to take PO and Inpatient level of care appropriate due to severity of illness   Dispo: The patient is from: Home              Anticipated d/c is to: SNF vs brain injury-family does not wish to place patient in SNF.  If this is the only option they would like to consider taking the patient home although this may be a difficult undertaking given current requirement for trach and understandable fears about managing such complex patient in the home environment.  They have been updated as to why this patient currently is not a candidate for aggressive rehabilitative therapies like would be provided at a brain injury recovery rehab.  **3/24 Husband clarified that he has not delegated POA status to other family members.  **3/25 LCSW called patient's husband at home.  He confirmed that he is okay with daughter managing the Medicaid and disability process and has agreed to change the address to the one requested by the daughter.  Also a letter was requested by daughter to clarify this process and this has been placed in the chart. **3/31 power of attorney documentation has now been placed on the patient's chart.  Documentation stated for Dec 04, 2018              Patient currently is not medically stable to d/c.   Difficult to place patient Yes   Level of care: Progressive  Code Status: Full Family Communication:  extensive conversation via telephone with [name] [phone]) at bedside on 4/1.; [name] [phone]) is her POA and legal contact.   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 Subsequently on 2/10, she developed obtundation and respiratory arrest leading to intubation. She has remained persistently obtunded, now has a trach and a PEG. Hospital course complicated by persistent fever,Serratia pneumoniaand now pre-orbital cellulitis  As of 3/25 with adjustments and hypertonicity medications patient seems to be improving.  She is also waking up more although limited interaction but both eyes are now open and she tracks with the left eye.  Multiple discussions with husband who has confirmed he has not delegated other family members to be POA and he is the primary legal contact for patient.  He has agreed to let patient's daughter take the lead on completing and following up on Medicaid and disability applications including having these applications sent to her address.  2/09 Admit for [place] 2/10 EVD placed, later intubated due to obtundation and respiratory arrest 2/11 MRI Brain, hypertonic saline d/c'd, EVD not working. Husband did not consent to PICC  2/13 EVD removed 2/17  [place] discussion with family, PMT 2/24 Bedside trach and PEG 3/1 Transferred to [facility]  Subjective: Patient awake with eyes open.  Appears to be tracking around the room primarily to sound.  Does not make any attempts to follow commands.  Objective: Vitals:   10/19/20 0323 10/19/20 0400  BP:  125/87  Pulse: 93 98  Resp: 18 15  Temp:  99.8 F (37.7 C)  SpO2: 98% 98%    Intake/Output Summary (Last 24 hours) at 10/19/2020 0748 Last data filed at 10/19/2020 0500 Gross per 24  hour  Intake 2520 ml  Output 1150 ml  Net 1370 ml   Filed Weights   10/16/20 0403 10/18/20 0500 10/18/20 1613  Weight: 66.7 kg 65.5 kg 67.7 kg    Exam:  Constitutional: Calm and in no acute distress Respiratory: #6.0 cuffless trach, trach collar with FiO2 21% at 5L,  O2 sats 98 to 100%.  RT at bedside.  Confirms infrequent suctioning required.  Anterior lung sounds coarse but clear. Cardiovascular: S1 and S2.  Normotensive.  + tachycardia when stimulated..  No peripheral edema. Abdomen:  PEG tube for continuous tube feedings, normoactive bowel sounds.  LBM 4/2 Musculoskeletal: Torticollis with associated left neck deviation markedly improved with patient now noted with head at midline gaze.    Marked improvement in hypertonicity although with full extension of left arm there remains some tightness but does not result in rebound spasticity.   Neurologic: CN 2-12 with abnormalities as follows: Blink reflex is present bilaterally although still has some medial disconjugate gaze involving the left eye.  Exam otherwise limited due to patient's inability to participate.  Noted reduction and upper extremity hypertonicity.  Hand splints in place.  Lower extremity foot splints in place. Psych: Awake with eyes open but not following commands therefore unable to assess orientation.  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 Intraventricular penetration with blood filling the 3rd and forth ventricles and nearly filling the right lateral ventricle.  -MRI 2/11 with intraparenchymal hemorrhage centered within the right thalamus and basal ganglia extending into the right midbrain with intraventricular extension. Effacement of prepontine cistern and similar 4 mm of leftward midline shift.  Separate areas of ristricted diffusion and edema involving the right greater than left mesial temporal lobes / parahippocampal cortex, bilateral basal ganglia, and possibly the upper pons that are concerning for acute infarcts, most likely 2/2 mass effect from the hemorrhage (see report)/ Per neuro "scattered b/l hippocampus, posterior CC, bilateral thalamus, and upper pons, likely due to deep penetrating vessel compression by focal mass-effect".  -2/19 MRI with acute to early subacute infarcts in the bilateral cerebral white matter and right cerebellum, new from 2/11 - interval evolution of multiple other infarcts present on prior MRI, increased hemorrhage along old right EVD tract (see report) - 2/21 head CT with expected evolution of ICH since 2/13 - Echo with normal EF, no intracardiac source of embolism - CTA head/neck without intracranial arterial occlusion or high grade stenosis, no dissection, aneurysm, or hemodynamically significant stenosis of the carotid or vertebral arteries - Head CT 3/9 with expected evolution of hemorrhage involving the right thalamus, basal ganglia, and adjacent white matter from prior exam with developing encephalomalacia.  Resolved previous intraventricular hemorrhage.  Persistent sulcal effacement with mild improvement.  Persistent 3 mm left to right midline shift.  Multiple areas of low attenuation  consistent with infarcts within the central white matter, unchanged in distribution from prior.  (see report) - Per neurology- see last follow up note from 2/25 - [place] likely 2/2 hypertension and nonadherence to meds  - s/p trach/peg on 2/24  - no antithrombotic 2/2 ICH -Obstructive hydrocephalus -> s/p external ventricular catheter placement on 08/27/20 by neurosurgery - EVD clamped on 2/12 and d/c'd on 2/13 -EEG suggestive of cortical dysfunction in right hemisphere likely 2/2 underlying bleed as well as severe diffuse encephalopathy, nonspecific etiology . -Continue  baclofen 5 mg at 8 AM and 4 PM. -Continue baclofen 7.5 mg at bedtime -continue to follow LFTs.  Please note that patient has some stable baseline nonobstructive transaminitis. -Appreciate assistance of PMR team.  Noted that if recent increase in probably dose does not improve wakefulness consideration given to initiating Ritalin and/or changing to Symmetrel. Provigil dose increased to 200 mg on 4/1 by PMR MD -3/22 EEG no seizures and persistent encephalopathy -Prognosis remains guarded and it is highly doubtful she will return to previous level of functioning therefore financial counseling notified to pursue disability application -Add scheduled Senokot at HS given requirement for SMRs   Chronic pansinusitis/recurrent fevers 2/2 aspiration PNA;History of Serratia Pneumonia  Hospital Acquired Pneumonia:  -Tracheal aspirate from 2/25 and 3/1 with serratia - cefepime 2/27-3/5; repeat sputum cx 3/21 + serratia (likely colonization but w/ recurrent fever opted to treat) -Has completed antibiotics recently for bilateral orbital cellulitis -started on Cipro for tracheal aspirate positive for Serratia marcescens   -Repeat sinus CT demonstrates progressive complete bilateral maxillary sinus opacification with unchanged complete left sphenoid sinus opacification and right sphenoid sinus fluid c/w chronic state  -CT Chest 3/30 with aspiration PNA-dtr updated at bedside by attending MD- Cipro changed to Unasyn IV -no increased O2 needs  Neck Contracture/torticollis -Preference for keeping head turned to the left. -Continue baclofen as above -- continue PT/OT weekly per their recommendations given patient's inability to effectively participate at this juncture.  Nursing staff performing PROM activities.  Family also instructed and has been performing intermittently. PT eval 3/29 Pt with both eyes opened more frequently today and seemed to be attempting some purposeful mvmts with R hand, reached between  LE's after having a BM in sitting. Pt still not following commands. Maintained sitting x25 mins with  Min A during static sitting and mod/ max A due to posterior lean with facilitated mvmt of LE's/ UE's/ cervical spine. PT will continue to follow.  Hypertensive emergency/Malignant HTN Resolved -Home medications include Coreg and HCTZ which have not been reordered -Continue metoprolol, hydralazine  and amlodipine -Blood pressure much better controlled after hypertonicity and pain treated  Acute hypoxemic respiratory failure secondary to CVA requiring mechanical ventilation and tracheostomy tube -Stable on trach collar and has been transitioned to a #6 cuffless trach -Has significant altered mentation and inability to manage oral secretions at present time is not a safe candidate for decannulation.  Discussed this with family as major obstacle in discharging to home.  This will also decrease options for SNF placement. -Appreciate trach team assistance in management  Bacterial Conjunctivitis/periorbital cellulitis - MRI 3/7 orbits with L periorbital cellulitis  -- CT head 3/9 with persistent subtotal opacification of paranasal sinuses and mastoid air cells, bubbly lucency at base of R maxillary sinus (likely odontogenic in origin) - CT maxillofacial 3/12 - improving cellulitis  - s/p erythromycin ointment - completed course of Ancef   Pain likely related to underlying hypertonicity -Morphine IV and Tylenol prn (watch  elev LFTs) -Treat underlying hypertonicity with skeletal muscle relaxers and uptitrate as tolerated with goal to improve and resolve hypertonicity without contributing to oversedation  Dysphagia/Nutrition Status: Nutrition Problem: Inadequate oral intake Etiology: inability to eat Signs/Symptoms: NPO status Interventions: Tube feeding consisting of Osmolite 1.2 Cal at 55 cc/h; continue Prosource liquid 45 cc daily, free water 100 cc every 4 hours per tube; will also continue  to follow CBGs regularly while on tube feedings and provide SSI Estimated body mass index is 24.84 kg/m as calculated from the following:   Height as of this encounter: '5\' 5"'  (1.651 m).   Weight as of this encounter: 67.7 kg. If patient eventually discharged to home to teach family how to bolus feed as opposed to utilizing continuous tube feeding infusions.   Other problems: Anasarca -bilateral upper extremity edema, minimal LE edema - suspect related to prolonged hospitalization, hypoalbuminemia -upper extremity US-> age indeterminate SVT of right cephalic vein  Elevated LFT's - Remain elevated but stable without any obstructive pattern -Continue baclofen and statin  Leukocytosis -Resolved on antibiotics  Anemia -Stable, follow  Sinus Tachycardia Better on BB and after treatment of pain/hypertonicity Echo with EF 60-65% (see report)   Data Reviewed: Basic Metabolic Panel: Recent Labs  Lab 10/12/20 1226 10/15/20 0309 10/15/20 0734 10/17/20 0328 10/19/20 0510  NA 140 138  --  137 140  K 4.2 3.2*  --  4.2 4.1  CL 103 101  --  103 102  CO2 29 28  --  25 27  GLUCOSE 83 151*  --  122* 136*  BUN 22* 23*  --  17 20  CREATININE 0.79 0.80  --  0.65 0.69  CALCIUM 9.6 9.0  --  9.3 9.3  MG  --   --  2.1  --   --    Liver Function Tests: Recent Labs  Lab 10/12/20 1226 10/15/20 0309 10/17/20 0328 10/19/20 0510  AST 65* 34 58* 45*  ALT 104* 64* 101* 78*  ALKPHOS 105 91 102 93  BILITOT 0.4 0.4 0.5 0.4  PROT 7.3 6.6 7.5 7.1  ALBUMIN 2.8* 2.6* 2.8* 2.6*   No results for input(s): LIPASE, AMYLASE in the last 168 hours. No results for input(s): AMMONIA in the last 168 hours. CBC: Recent Labs  Lab 10/12/20 1226 10/17/20 0328  WBC 7.6 8.5  NEUTROABS 4.8 4.5  HGB 9.9* 10.3*  HCT 32.2* 32.3*  MCV 84.5 82.6  PLT 384 346   Cardiac Enzymes: No results for input(s): CKTOTAL, CKMB, CKMBINDEX, TROPONINI in the last 168 hours. BNP (last 3 results) No results for  input(s): BNP in the last 8760 hours.  ProBNP (last 3 results) No results for input(s): PROBNP in the last 8760 hours.  CBG: Recent Labs  Lab 10/18/20 1144 10/18/20 1547 10/18/20 2001 10/18/20 2346 10/19/20 0432  GLUCAP 135* 147* 122* 154* 138*    No results found for this or any previous visit (from the past 240 hour(s)).   Studies: No results found.  Scheduled Meds: . amLODipine  5 mg Per Tube Daily  . atorvastatin  40 mg Per Tube Daily  . baclofen  5 mg Per Tube 2 times per day  . baclofen  7.5 mg Per Tube Daily  . chlorhexidine gluconate (MEDLINE KIT)  15 mL Mouth Rinse BID  . Chlorhexidine Gluconate Cloth  6 each Topical Daily  . enoxaparin (LOVENOX) injection  40 mg Subcutaneous Q24H  . feeding supplement (PROSource TF)  45 mL Per Tube Daily  .  free water  100 mL Per Tube Q4H  . hydrALAZINE  50 mg Per Tube Q8H  . insulin aspart  0-9 Units Subcutaneous Q4H  . mouth rinse  15 mL Mouth Rinse 5 X Daily  . metoprolol tartrate  125 mg Per Tube BID  . modafinil  200 mg Per Tube Daily  . pantoprazole sodium  40 mg Per Tube BID  . scopolamine  1 patch Transdermal Q72H  . sennosides  5 mL Per Tube QHS   Continuous Infusions: . sodium chloride Stopped (09/28/20 1344)  . ampicillin-sulbactam (UNASYN) IV 3 g (10/19/20 0620)  . feeding supplement (OSMOLITE 1.2 CAL) 1,000 mL (10/19/20 0038)    Principal Problem:   ICH (intracerebral hemorrhage) (HCC) Active Problems:   Intraparenchymal hemorrhage of brain (HCC)   Acute hypoxemic respiratory failure (HCC)   Fever   Endotracheal tube present   Status post tracheostomy ([place])   Palliative care by specialist   Muscle hypertonicity   Oropharyngeal dysphagia   Brain injury with loss of consciousness ([place])   Poor prognosis   Aspiration pneumonia of right lower lobe [facility])   Consultants:  Palliative care  Pulm  Neurology   Procedures:  Tracheostomy  PEG  tube  EEG  Echocardiogram  Antibiotics: Anti-infectives (From admission, onward)   Start     Dose/Rate Route Frequency Ordered Stop   10/14/20 1815  Ampicillin-Sulbactam (UNASYN) 3 g in sodium chloride 0.9 % 100 mL IVPB        3 g 200 mL/hr over 30 Minutes Intravenous Every 6 hours 10/14/20 1754 10/19/20 1814   10/08/20 0845  ciprofloxacin (CIPRO) tablet 500 mg  Status:  Discontinued        500 mg Per Tube 2 times daily 10/08/20 0754 10/14/20 1731   09/21/20 1545  ceFAZolin (ANCEF) IVPB 1 g/50 mL premix        1 g 100 mL/hr over 30 Minutes Intravenous Every 8 hours 09/21/20 1457 09/28/20 0647   09/20/20 0600  ceFAZolin (ANCEF) IVPB 1 g/50 mL premix  Status:  Discontinued        1 g 100 mL/hr over 30 Minutes Intravenous Every 8 hours 09/14/20 0903 09/14/20 0904   09/16/20 1300  vancomycin (VANCOREADY) IVPB 1250 mg/250 mL  Status:  Discontinued        1,250 mg 166.7 mL/hr over 90 Minutes Intravenous Every 24 hours 09/15/20 1232 09/17/20 0956   09/15/20 1230  vancomycin (VANCOREADY) IVPB 1250 mg/250 mL        1,250 mg 166.7 mL/hr over 90 Minutes Intravenous  Once 09/15/20 1131 09/15/20 1402   09/13/20 1400  ceFEPIme (MAXIPIME) 2 g in sodium chloride 0.9 % 100 mL IVPB        2 g 200 mL/hr over 30 Minutes Intravenous Every 8 hours 09/13/20 1341 09/19/20 2208   09/06/20 0600  vancomycin (VANCOREADY) IVPB 1250 mg/250 mL        1,250 mg 166.7 mL/hr over 90 Minutes Intravenous  Once 09/06/20 0541 09/06/20 0739   09/05/20 1400  ceFAZolin (ANCEF) IVPB 1 g/50 mL premix  Status:  Discontinued        1 g 100 mL/hr over 30 Minutes Intravenous Every 8 hours 09/05/20 1120 09/13/20 1341   08/30/20 0600  vancomycin (VANCOREADY) IVPB 500 mg/100 mL  Status:  Discontinued        500 mg 100 mL/hr over 60 Minutes Intravenous Every 12 hours 08/29/20 1554 09/01/20 1115   08/29/20 1700  ceFEPIme (MAXIPIME) 2 g in  sodium chloride 0.9 % 100 mL IVPB  Status:  Discontinued        2 g 200 mL/hr over 30  Minutes Intravenous Every 12 hours 08/29/20 1554 09/01/20 1115   08/29/20 1645  vancomycin (VANCOREADY) IVPB 1250 mg/250 mL        1,250 mg 166.7 mL/hr over 90 Minutes Intravenous  Once 08/29/20 1554 08/29/20 1820       Time spent: 35 minutes    Erin Hearing ANP  [facility] 7 am - 330 pm/M-F for direct patient care and secure chat Please refer to Amion for contact info 53  days

## 2020-10-19 NOTE — TOC Progression Note (Signed)
Transition of Care [facility]) - Progression Note    Patient Details  Name: Charlene [name] MRN: [id number] Date of Birth: Dec 04, 1965  Transition of Care [facility]) CM/SW [place], RN Phone Number: 10/19/2020, 1:36 PM  Clinical Narrative:    Case management and MSW, [name], met with the patient's daughter, Ines Bloomer to discuss that the [place] received from the patient's family was not considered to be a valid and legal document as determined by the legal team at [facility].  Unfortunately, the [place] was not signed by the patient nor witnessed when the document was notarized.  Barbette Or, MSW supervisor is aware and the document has been removed from the patient's chart at this time.  The patient's daughter, [name], was given the [place] document and she states that she will discuss the document with her lawyer about the documents validity and will bring further documents to the hospital as necessary.  At this time, the patient's husband is legally the dedicated medical decision maker.  CM and MSW of Difficult to Place Team will continue to follow the patient for transitions of care needs.   Expected Discharge Plan: [place] Barriers to Discharge: Continued Medical Work up  Expected Discharge Plan and Services Expected Discharge Plan: [place] In-house Referral: Clinical Social Work Discharge Planning Services: CM Consult   Living arrangements for the past 2 months: Single Family Home                                       Social Determinants of Health (SDOH) Interventions    Readmission Risk Interventions Readmission Risk Prevention Plan 09/29/2020  Transportation Screening Complete  PCP or Specialist Appt within 3-5 Days Complete  HRI or [place] Complete  Social Work Consult for [place] Planning/Counseling Complete   Palliative Care Screening Complete  Medication Review Press photographer) Complete  Some recent data might be hidden

## 2020-10-19 NOTE — Progress Notes (Signed)
Patient ID: [name], female   DOB: 07-12-1966, 55 y.o.   MRN: [id number]  Medical records reviewed   55 year old female with past medical history as below, which is significant for uncontrolled  hypertension and reported medication noncompliance. Presented to the emergency department at [facility] 2/9 with c/o headache and left-sided weakness. STAT CT Head demonstrated right thalamic/basal ganglia hemorrhage with intraventricular extension. She was transferred to [facility] emergency department for in person neurology evaluation. There was evidence of obstructive hydrocephalus and neurosurgery was consulted. External ventricular drain was placed 2/10.In the hours following she became increasingly obtunded. PCCM was consulted. Upon PCCM arrival the patient became apneic and was intubated. EVD removed 2/13     Family meeting held 09-03-20     Trach/PEG 09-10-20  Remains inpatient appropriate because:Altered mental status, Unsafe d/c plan, IV treatments appropriate due to intensity of illness or inability to take PO and Inpatient level of care appropriate due to severity of illness  Today is day 20 of this hospital stay    Patient remains minimally responsive,  unable to follow commands.   Likely long term poor prognosis for meaningful recovery.  Family in continued discussion and decision regarding transition of care with each other and medical providers.   I spoke to [name]/husband today.  Created space and opportunity for him to share his thoughts and feelings regarding current medical situation.  He speaks to  family dynamics and "everybody is handling this differently".    Family remain open to all offered and available medical interventions to prolong life.  I discussed with [name] that PMD would be signing off at this time.  Patient has multiple levels of support, primary medical team, transition of care team, social work.   I encouraged him to call PMT with questions or concerns into the  future.  Please reconsult palliative medicine with future needs to get these people  Total time spent on the unit was 25  minutes    Greater than 50% of the time was spent in counseling and coordination of care   Lorinda Creed NP  Palliative Medicine Team Team Phone # [phone] Pager [phone]

## 2020-10-20 LAB — GLUCOSE, CAPILLARY
Glucose-Capillary: 155 mg/dL — ABNORMAL HIGH (ref 70–99)
Glucose-Capillary: 127 mg/dL — ABNORMAL HIGH (ref 70–99)
Glucose-Capillary: 106 mg/dL — ABNORMAL HIGH (ref 70–99)
Glucose-Capillary: 148 mg/dL — ABNORMAL HIGH (ref 70–99)
Glucose-Capillary: 126 mg/dL — ABNORMAL HIGH (ref 70–99)
Glucose-Capillary: 141 mg/dL — ABNORMAL HIGH (ref 70–99)

## 2020-10-20 NOTE — Progress Notes (Signed)
Occupational Therapy Treatment Patient Details Name: PATRA GHERARDI MRN: [id number] DOB: 1966/06/05 Today's Date: 10/20/2020    History of present illness Pt is 55 yo female presents to [facility] on 2/9 with L sided weakness and headaches. PMH includes anxiety, HTN. 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 s/p Right frontal ventricular catheter placement on 2/10, stopped functioning on 2/11.  MRI 2/11 shows right greater than left mesial temporal lobes / parahippocampal cortex, bilateral basal ganglia, and possibly the upper pons that are concerning for acute infarcts, most likely secondary to mass effect from the hemorrhage. ETT 2/10-present.  Pt with trach and PEG on 09/10/20. Hospital course complicated by persistent fever, Serratia pneumonia and now pre-orbital cellulitis.   OT comments  Patient supine in bed, engaged in OT/PT session.   Patient remains with eyes partially open during session, not following commands or demonstrating volitional movements. Total assist +2 to place maximove sling and lift to recliner, positioned in recliner supported by pillows- VSS throughout session.  PROM to L UE completed, question L elbow contracture forming as noted resistance to elbow extension with combination of tone and resistance for -45 degree full extension. Educated mother on elbow ROM importance. Will continue per plan with focus on family education, positioning, splinting and ROM.    Follow Up Recommendations  SNF;Supervision/Assistance - 24 hour    Equipment Recommendations  Hospital bed;Wheelchair cushion (measurements OT);Wheelchair (measurements OT);Other (comment) (hoyer lift)    Recommendations for Other Services      Precautions / Restrictions Precautions Precautions: Fall Precaution Comments: peg and trach Restrictions Weight Bearing Restrictions: No        Mobility Bed Mobility Overal bed mobility: Needs Assistance Bed Mobility: Rolling Rolling: Total assist;+2 for physical assistance;+2 for safety/equipment         General bed mobility comments: total assist to roll and place maximove sling    Transfers Overall transfer level: Needs assistance               General transfer comment: total assist +2 safety using maximove    Balance                                           ADL either performed or assessed with clinical judgement   ADL                                         General ADL Comments: total A     Vision       Perception     Praxis      Cognition Arousal/Alertness: Awake/alert Behavior During Therapy: Flat affect Overall Cognitive Status: Difficult to assess                                 General Comments: eyes partially open but not responsive, gaze downward        Exercises     Shoulder Instructions       General Comments PROM L UE from shoulder to hand, noted increased L elbow tightness and question contracture forming as -45 degrees full extension. Attempted R UE ROM, but  any ROM leading to extension and adduction synergy.    Pertinent Vitals/ Pain       Pain Assessment: Faces Faces Pain Scale: No hurt  Home Living                                          Prior Functioning/Environment              Frequency  Min 1X/week        Progress Toward Goals  OT Goals(current goals can now be found in the care plan section)  Progress towards OT goals: Progressing toward goals  Acute Rehab OT Goals Patient Stated Goal: pt unable OT Goal Formulation: Patient unable to participate in goal setting  Plan Discharge plan remains appropriate;Frequency remains appropriate    Co-evaluation    PT/OT/SLP Co-Evaluation/Treatment: Yes Reason for Co-Treatment: Complexity of the patient's impairments  (multi-system involvement)   OT goals addressed during session: Strengthening/ROM      AM-PAC OT "6 Clicks" Daily Activity     Outcome Measure   Help from another person eating meals?: Total Help from another person taking care of personal grooming?: Total Help from another person toileting, which includes using toliet, bedpan, or urinal?: Total Help from another person bathing (including washing, rinsing, drying)?: Total Help from another person to put on and taking off regular upper body clothing?: Total Help from another person to put on and taking off regular lower body clothing?: Total 6 Click Score: 6    End of Session    OT Visit Diagnosis: Other symptoms and signs involving the nervous system (R29.898);Other symptoms and signs involving cognitive function;Other (comment) Symptoms and signs involving cognitive functions: Other Nontraumatic ICH Hemiplegia - caused by: Other Nontraumatic intracranial hemorrhage   Activity Tolerance Patient tolerated treatment well   Patient Left in chair;with call bell/phone within reach;with chair alarm set;with family/visitor present   Nurse Communication Mobility status;Precautions        Time: 6789-3810 OT Time Calculation (min): 45 min  Charges: OT General Charges $OT Visit: 1 Visit OT Treatments $Therapeutic Activity: 8-22 mins  Barry Brunner, OT Acute Rehabilitation Services Pager [phone] Office [phone]    [name] 10/20/2020, 12:22 PM

## 2020-10-20 NOTE — Progress Notes (Signed)
[facility] PROGRESS NOTE  Charlene [name] LPF:[id number] DOB: 08-11-1965 DOA: 08/26/2020 PCP: Patient, [name] (Inactive)    October 08, 2020     10/16/2020                        10/16/2020     10/18/2020                        10/20/2020    Status: Remains inpatient appropriate because:Altered mental status, Unsafe d/c plan, IV treatments appropriate due to intensity of illness or inability to take PO and Inpatient level of care appropriate due to severity of illness   Dispo: The patient is from: Home              Anticipated d/c is to: SNF vs brain injury-family does not wish to place patient in SNF.  If this is the only option they would like to consider taking the patient home although this may be a difficult undertaking given current requirement for trach and understandable fears about managing such complex patient in the home environment.  They have been updated as to why this patient currently is not a candidate for aggressive rehabilitative therapies like would be provided at a brain injury recovery rehab.  **3/24 Husband clarified that he has not delegated POA status to other family members.  **3/25 LCSW called patient's husband at home.  He confirmed that he is okay with daughter managing the Medicaid and disability process and has agreed to change the address to the one requested by the daughter.  Also a letter was requested by daughter to clarify this process and this has been placed in the chart. **3/31 power of attorney documentation has now been placed on the patient's chart.  Documentation stated for Dec 04, 2018              Patient currently is not medically stable to d/c.   Difficult to place patient Yes   Level of care: Progressive  Code Status: Full Family Communication:  extensive conversation via telephone with [name] [phone]) at bedside on 4/1.; [name] [phone]) is her POA and legal contact.   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 Subsequently on 2/10, she developed obtundation and respiratory arrest leading to intubation. She has remained persistently obtunded, now has a trach and a PEG. Hospital course complicated by persistent fever,Serratia pneumoniaand now pre-orbital cellulitis  As of 3/25 with adjustments and hypertonicity medications patient seems to be improving.  She is also waking up more although limited interaction but both eyes are now open and she tracks with the left eye.  Multiple discussions with husband who has confirmed he has not delegated other family members to be POA and he is the primary legal contact for patient.  He has agreed to let patient's daughter take the lead on completing and following up on Medicaid and disability applications including having these applications sent to her address.  2/09 Admit for [place] 2/10 EVD placed, later intubated  due to obtundation and respiratory arrest 2/11 MRI Brain, hypertonic saline d/c'd, EVD not working. Husband did not consent to PICC  2/13 EVD removed 2/17 [place] discussion with family, PMT 2/24 Bedside trach and PEG 3/1 Transferred to [facility]  Subjective: Sitting up in chair.  Mother is at bedside.  Objective: Vitals:   10/20/20 0300 10/20/20 0734  BP: 107/66 123/84  Pulse: 90 90  Resp: (!) 22 (!) 24  Temp: 99 F (37.2 C) 98.9 F (37.2 C)  SpO2: 97% 97%    Intake/Output Summary (Last 24 hours) at 10/20/2020 0807 Last data filed at 10/20/2020 0300 Gross per 24 hour  Intake 1675.42 ml   Output 650 ml  Net 1025.42 ml   Filed Weights   10/16/20 0403 10/18/20 0500 10/18/20 1613  Weight: 66.7 kg 65.5 kg 67.7 kg    Exam:  Constitutional: Eyes are open, calm, no acute distress Respiratory: #6.0 cuffless trach, trach collar with FiO2 21% at 5L, anterior lung sounds are clear to auscultation somewhat diminished in the bases.  O2 sats 97 to 99%. Cardiovascular: Intermittent tachycardia.  Normal heart sounds.  Normotensive.  Pulse regular. Abdomen:  PEG tube for continuous tube feedings and medications.  Soft nontender with normoactive bowel sounds LBM 4/2 months Musculoskeletal: Marked decrease in torticollis with associated left neck deviation markedly improved.  Previous hypertonicity significantly improved without any rebound spasticity with PROM Neurologic: CN 2-12 with abnormalities as follows: Blink reflex is present bilaterally although still has some medial disconjugate gaze involving the left eye. Lower extremity foot splints in place. Psych: Eyes open.  Does not track with eyes.  NonverbalCriss Rosales affect.  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 Intraventricular penetration with blood filling the 3rd and forth ventricles and nearly filling the right lateral ventricle.  -MRI 2/11 with intraparenchymal hemorrhage centered within the right thalamus and basal ganglia extending into the right midbrain with intraventricular extension. Effacement of prepontine cistern and similar 4 mm of leftward midline shift. Separate areas of ristricted diffusion and edema involving the right greater than left mesial temporal lobes / parahippocampal cortex, bilateral basal ganglia, and possibly the upper pons that are concerning for acute infarcts, most likely 2/2 mass  effect from the hemorrhage (see report)/ Per neuro "scattered b/l hippocampus, posterior CC, bilateral thalamus, and upper pons, likely due to deep penetrating vessel compression by focal mass-effect".  -2/19 MRI with acute to early subacute infarcts in the bilateral cerebral white matter and right cerebellum, new from 2/11 - interval evolution of multiple other infarcts present on prior MRI, increased hemorrhage along old right EVD tract (see report) - 2/21 head CT with expected evolution of ICH since 2/13 - Echo with normal EF, no intracardiac source of embolism - CTA head/neck without intracranial arterial occlusion or high grade stenosis, no dissection, aneurysm, or hemodynamically significant stenosis of the carotid or vertebral arteries - Head CT 3/9 with expected evolution of hemorrhage involving the right thalamus, basal ganglia, and adjacent white matter from prior exam with developing encephalomalacia.  Resolved previous intraventricular hemorrhage.  Persistent sulcal effacement with mild improvement.  Persistent 3 mm left to right midline shift.  Multiple areas of low attenuation consistent with infarcts within the central white matter, unchanged in distribution from prior.  (see report) - Per neurology- see last follow up note from 2/25 - ICH likely 2/2  hypertension and nonadherence to meds  - s/p trach/peg on 2/24  - no antithrombotic 2/2 ICH -Obstructive hydrocephalus -> s/p external ventricular catheter placement on 08/27/20 by neurosurgery - EVD clamped on 2/12 and d/c'd on 2/13 -EEG suggestive of cortical dysfunction in right hemisphere likely 2/2 underlying bleed as well as severe diffuse encephalopathy, nonspecific etiology . -Continue baclofen 5 mg at 8 AM and 4 PM. -Continue baclofen 7.5 mg at bedtime -continue to follow LFTs.  Please note that patient has some stable baseline nonobstructive transaminitis. -Appreciate assistance of PMR team.  Noted that if recent increase in  probably dose does not improve wakefulness consideration given to initiating Ritalin and/or changing to Symmetrel. Provigil dose increased to 200 mg on 4/1 by PMR MD -3/22 EEG no seizures and persistent encephalopathy -Prognosis remains guarded and it is highly doubtful she will return to previous level of functioning therefore financial counseling notified to pursue disability application -Add scheduled Senokot at HS given requirement for SMRs   Chronic pansinusitis/recurrent fevers 2/2 aspiration PNA;History of Serratia Pneumonia  Hospital Acquired Pneumonia:  -Tracheal aspirate from 2/25 and 3/1 with serratia - cefepime 2/27-3/5; repeat sputum cx 3/21 + serratia (likely colonization but w/ recurrent fever opted to treat) -Has completed antibiotics recently for bilateral orbital cellulitis -started on Cipro for tracheal aspirate positive for Serratia marcescens   -Repeat sinus CT demonstrates progressive complete bilateral maxillary sinus opacification with unchanged complete left sphenoid sinus opacification and right sphenoid sinus fluid c/w chronic state  -CT Chest 3/30 with aspiration PNA-dtr updated at bedside by attending MD- Cipro changed to Unasyn IV -no increased O2 needs  Neck Contracture/torticollis -Preference for keeping head turned to the left. -Continue baclofen as above -- continue PT/OT weekly per their recommendations given patient's inability to effectively participate at this juncture.  Nursing staff performing PROM activities.  Family also instructed and has been performing intermittently. PT eval 3/29 Pt with both eyes opened more frequently today and seemed to be attempting some purposeful mvmts with R hand, reached between LE's after having a BM in sitting. Pt still not following commands. Maintained sitting x25 mins with  Min A during static sitting and mod/ max A due to posterior lean with facilitated mvmt of LE's/ UE's/ cervical spine. PT will continue to  follow.  Hypertensive emergency/Malignant HTN Resolved -Home medications include Coreg and HCTZ which have not been reordered -Continue metoprolol, hydralazine  and amlodipine -Blood pressure much better controlled after hypertonicity and pain treated  Acute hypoxemic respiratory failure secondary to CVA requiring mechanical ventilation and tracheostomy tube -Stable on trach collar and has been transitioned to a #6 cuffless trach -Has significant altered mentation and inability to manage oral secretions at present time is not a safe candidate for decannulation.  Discussed this with family as major obstacle in discharging to home.  This will also decrease options for SNF placement. -Appreciate trach team assistance in management  Bacterial Conjunctivitis/periorbital cellulitis - MRI 3/7 orbits with L periorbital cellulitis  -- CT head 3/9 with persistent subtotal opacification of paranasal sinuses and mastoid air cells, bubbly lucency at base of R maxillary sinus (likely odontogenic in origin) - CT maxillofacial 3/12 - improving cellulitis  - s/p erythromycin ointment - completed course of Ancef   Pain likely related to underlying hypertonicity -Morphine IV and Tylenol prn (watch elev LFTs) -Treat underlying hypertonicity with skeletal muscle relaxers and uptitrate as tolerated with goal to improve and resolve hypertonicity without contributing to oversedation  Dysphagia/Nutrition Status: Nutrition Problem: Inadequate  oral intake Etiology: inability to eat Signs/Symptoms: NPO status Interventions: Tube feeding consisting of Osmolite 1.2 Cal at 55 cc/h; continue Prosource liquid 45 cc daily, free water 100 cc every 4 hours per tube; will also continue to follow CBGs regularly while on tube feedings and provide SSI Estimated body mass index is 24.84 kg/m as calculated from the following:   Height as of this encounter: '5\' 5"'  (1.651 m).   Weight as of this encounter: 67.7 kg. If patient  eventually discharged to home to teach family how to bolus feed as opposed to utilizing continuous tube feeding infusions.   Other problems: Anasarca -bilateral upper extremity edema, minimal LE edema - suspect related to prolonged hospitalization, hypoalbuminemia -upper extremity US-> age indeterminate SVT of right cephalic vein  Elevated LFT's - Remain elevated but stable without any obstructive pattern -Continue baclofen and statin  Leukocytosis -Resolved on antibiotics  Anemia -Stable, follow  Sinus Tachycardia Better on BB and after treatment of pain/hypertonicity Echo with EF 60-65% (see report)   Data Reviewed: Basic Metabolic Panel: Recent Labs  Lab 10/15/20 0309 10/15/20 0734 10/17/20 0328 10/19/20 0510  NA 138  --  137 140  K 3.2*  --  4.2 4.1  CL 101  --  103 102  CO2 28  --  25 27  GLUCOSE 151*  --  122* 136*  BUN 23*  --  17 20  CREATININE 0.80  --  0.65 0.69  CALCIUM 9.0  --  9.3 9.3  MG  --  2.1  --   --    Liver Function Tests: Recent Labs  Lab 10/15/20 0309 10/17/20 0328 10/19/20 0510  AST 34 58* 45*  ALT 64* 101* 78*  ALKPHOS 91 102 93  BILITOT 0.4 0.5 0.4  PROT 6.6 7.5 7.1  ALBUMIN 2.6* 2.8* 2.6*   No results for input(s): LIPASE, AMYLASE in the last 168 hours. No results for input(s): AMMONIA in the last 168 hours. CBC: Recent Labs  Lab 10/17/20 0328  WBC 8.5  NEUTROABS 4.5  HGB 10.3*  HCT 32.3*  MCV 82.6  PLT 346   Cardiac Enzymes: No results for input(s): CKTOTAL, CKMB, CKMBINDEX, TROPONINI in the last 168 hours. BNP (last 3 results) No results for input(s): BNP in the last 8760 hours.  ProBNP (last 3 results) No results for input(s): PROBNP in the last 8760 hours.  CBG: Recent Labs  Lab 10/19/20 1628 10/19/20 1944 10/19/20 2328 10/20/20 0303 10/20/20 0742  GLUCAP 112* 124* 122* 155* 127*    No results found for this or any previous visit (from the past 240 hour(s)).   Studies: DG CHEST PORT 1  VIEW  Result Date: 10/19/2020 CLINICAL DATA:  Hospital-acquired pneumonia EXAM: PORTABLE CHEST 1 VIEW COMPARISON:  10/05/2020 FINDINGS: Tracheostomy cannula is unchanged in position. Low depth of inspiration limits evaluation of the lungs. There is mild patchy opacity at the left medial lung base which is favored to be atelectasis rather than pneumonia. The lungs are otherwise clear. IMPRESSION: Mild patchy opacity at the left lung base favored to be atelectasis rather than pneumonia. Electronically Signed   By: Miachel Roux M.D.   On: 10/19/2020 07:53    Scheduled Meds: . amLODipine  5 mg Per Tube Daily  . atorvastatin  40 mg Per Tube Daily  . baclofen  5 mg Per Tube 2 times per day  . baclofen  7.5 mg Per Tube Daily  . chlorhexidine gluconate (MEDLINE KIT)  15 mL Mouth Rinse BID  .  Chlorhexidine Gluconate Cloth  6 each Topical Daily  . enoxaparin (LOVENOX) injection  40 mg Subcutaneous Q24H  . feeding supplement (PROSource TF)  45 mL Per Tube Daily  . free water  100 mL Per Tube Q4H  . hydrALAZINE  50 mg Per Tube Q8H  . insulin aspart  0-9 Units Subcutaneous Q4H  . mouth rinse  15 mL Mouth Rinse 5 X Daily  . metoprolol tartrate  125 mg Per Tube BID  . modafinil  200 mg Per Tube Daily  . pantoprazole sodium  40 mg Per Tube BID  . scopolamine  1 patch Transdermal Q72H  . sennosides  5 mL Per Tube QHS   Continuous Infusions: . sodium chloride Stopped (09/28/20 1344)  . feeding supplement (OSMOLITE 1.2 CAL) 1,000 mL (10/19/20 2020)    Principal Problem:   ICH (intracerebral hemorrhage) (HCC) Active Problems:   Intraparenchymal hemorrhage of brain (HCC)   Acute hypoxemic respiratory failure (HCC)   Fever   Endotracheal tube present   Status post tracheostomy ([place])   Palliative care by specialist   Muscle hypertonicity   Oropharyngeal dysphagia   Brain injury with loss of consciousness ([place])   Poor prognosis   Aspiration pneumonia of right lower lobe  [facility])   Consultants:  Palliative care  Pulm  Neurology   Procedures:  Tracheostomy  PEG tube  EEG  Echocardiogram  Antibiotics: Anti-infectives (From admission, onward)   Start     Dose/Rate Route Frequency Ordered Stop   10/14/20 1815  Ampicillin-Sulbactam (UNASYN) 3 g in sodium chloride 0.9 % 100 mL IVPB        3 g 200 mL/hr over 30 Minutes Intravenous Every 6 hours 10/14/20 1754 10/19/20 1340   10/08/20 0845  ciprofloxacin (CIPRO) tablet 500 mg  Status:  Discontinued        500 mg Per Tube 2 times daily 10/08/20 0754 10/14/20 1731   09/21/20 1545  ceFAZolin (ANCEF) IVPB 1 g/50 mL premix        1 g 100 mL/hr over 30 Minutes Intravenous Every 8 hours 09/21/20 1457 09/28/20 0647   09/20/20 0600  ceFAZolin (ANCEF) IVPB 1 g/50 mL premix  Status:  Discontinued        1 g 100 mL/hr over 30 Minutes Intravenous Every 8 hours 09/14/20 0903 09/14/20 0904   09/16/20 1300  vancomycin (VANCOREADY) IVPB 1250 mg/250 mL  Status:  Discontinued        1,250 mg 166.7 mL/hr over 90 Minutes Intravenous Every 24 hours 09/15/20 1232 09/17/20 0956   09/15/20 1230  vancomycin (VANCOREADY) IVPB 1250 mg/250 mL        1,250 mg 166.7 mL/hr over 90 Minutes Intravenous  Once 09/15/20 1131 09/15/20 1402   09/13/20 1400  ceFEPIme (MAXIPIME) 2 g in sodium chloride 0.9 % 100 mL IVPB        2 g 200 mL/hr over 30 Minutes Intravenous Every 8 hours 09/13/20 1341 09/19/20 2208   09/06/20 0600  vancomycin (VANCOREADY) IVPB 1250 mg/250 mL        1,250 mg 166.7 mL/hr over 90 Minutes Intravenous  Once 09/06/20 0541 09/06/20 0739   09/05/20 1400  ceFAZolin (ANCEF) IVPB 1 g/50 mL premix  Status:  Discontinued        1 g 100 mL/hr over 30 Minutes Intravenous Every 8 hours 09/05/20 1120 09/13/20 1341   08/30/20 0600  vancomycin (VANCOREADY) IVPB 500 mg/100 mL  Status:  Discontinued        500 mg  100 mL/hr over 60 Minutes Intravenous Every 12 hours 08/29/20 1554 09/01/20 1115   08/29/20 1700  ceFEPIme  (MAXIPIME) 2 g in sodium chloride 0.9 % 100 mL IVPB  Status:  Discontinued        2 g 200 mL/hr over 30 Minutes Intravenous Every 12 hours 08/29/20 1554 09/01/20 1115   08/29/20 1645  vancomycin (VANCOREADY) IVPB 1250 mg/250 mL        1,250 mg 166.7 mL/hr over 90 Minutes Intravenous  Once 08/29/20 1554 08/29/20 1820       Time spent: 35 minutes    Erin Hearing ANP  [facility] 7 am - 330 pm/M-F for direct patient care and secure chat Please refer to Amion for contact info 54  days

## 2020-10-20 NOTE — Progress Notes (Signed)
Physical Therapy Treatment Patient Details Name: Charlene [name] MRN: [id number] DOB: [birth year] Today's Date: 10/20/2020    History of Present Illness Pt is 55 yo female presents to [facility] on 2/9 with L sided weakness and headaches. PMH includes anxiety, HTN. 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 s/p Right frontal ventricular catheter placement on 2/10, stopped functioning on 2/11.  MRI 2/11 shows right greater than left mesial temporal lobes / parahippocampal cortex, bilateral basal ganglia, and possibly the upper pons that are concerning for acute infarcts, most likely secondary to mass effect from the hemorrhage. ETT 2/10-present.  Pt with trach and PEG on 09/10/20. Hospital course complicated by persistent fever, Serratia pneumonia and now pre-orbital cellulitis.    PT Comments    Pt received supine in bed, eyes open. Maximove used to transfer pt bed to recliner. Pt tolerated this well with stable HR, O2 sats, and BP but did not engage with this any more than she has when working in supine or seated EOB. Mother present and education given on positioning in the seated position. Pt not demonstrating volitional mvmt or following any commands. We are reaching the point where pt will not longer be appropriate for acute PT due to her status not changing with stimulation and family training on ROM and positioning being close to done. New goals set for next 2 weeks but expect to sign off at that point.     Follow Up Recommendations  SNF;Supervision/Assistance - 24 hour     Equipment Recommendations  Hospital bed    Recommendations for Other Services       Precautions / Restrictions Precautions Precautions: Fall Precaution Comments: peg and trach Restrictions Weight Bearing Restrictions: No    Mobility  Bed Mobility Overal bed mobility: Needs  Assistance Bed Mobility: Rolling Rolling: Total assist;+2 for physical assistance;+2 for safety/equipment         General bed mobility comments: total assist to roll and place maximove sling    Transfers Overall transfer level: Needs assistance               General transfer comment: total assist +2 safety using maximove to recliner  Ambulation/Gait             General Gait Details: unable   Stairs             Wheelchair Mobility    Modified Rankin (Stroke Patients Only) Modified Rankin (Stroke Patients Only) Pre-Morbid Rankin Score: No symptoms Modified Rankin: Severe disability     Balance                                            Cognition Arousal/Alertness: Awake/alert Behavior During Therapy: Flat affect Overall Cognitive Status: Difficult to assess                                 General Comments: eyes partially open but not responsive, gaze downward      Exercises General Exercises - Lower Extremity Long Arc Quad: PROM;Both;Seated;10 reps Hip ABduction/ADduction: PROM;Supine (prolonged stretch to adductors)    General Comments General comments (skin integrity, edema, etc.): mother present and education given on proper positioning once in seated position in chair  Pertinent Vitals/Pain Pain Assessment: Faces Faces Pain Scale: No hurt    Home Living                      Prior Function            PT Goals (current goals can now be found in the care plan section) Acute Rehab PT Goals Patient Stated Goal: pt's family wants her to go to intense rehab PT Goal Formulation: Patient unable to participate in goal setting Time For Goal Achievement: 11/03/20 Potential to Achieve Goals: Poor Progress towards PT goals: Not progressing toward goals - comment    Frequency    Min 1X/week      PT Plan Current plan remains appropriate    Co-evaluation PT/OT/SLP Co-Evaluation/Treatment:  Yes Reason for Co-Treatment: Complexity of the patient's impairments (multi-system involvement);Necessary to address cognition/behavior during functional activity;For patient/therapist safety;To address functional/ADL transfers PT goals addressed during session: Mobility/safety with mobility;Other (comment) (family education) OT goals addressed during session: Strengthening/ROM      AM-PAC PT "6 Clicks" Mobility   Outcome Measure  Help needed turning from your back to your side while in a flat bed without using bedrails?: Total Help needed moving from lying on your back to sitting on the side of a flat bed without using bedrails?: Total Help needed moving to and from a bed to a chair (including a wheelchair)?: Total Help needed standing up from a chair using your arms (e.g., wheelchair or bedside chair)?: Total Help needed to walk in hospital room?: Total Help needed climbing 3-5 steps with a railing? : Total 6 Click Score: 6    End of Session Equipment Utilized During Treatment: Oxygen Activity Tolerance: Patient tolerated treatment well Patient left: with call bell/phone within reach;in chair;with chair alarm set (posey alarm belt) Nurse Communication: Mobility status;Need for lift equipment PT Visit Diagnosis: Hemiplegia and hemiparesis;Other symptoms and signs involving the nervous system (R29.898);Other abnormalities of gait and mobility (R26.89) Hemiplegia - Right/Left: Right Hemiplegia - dominant/non-dominant: Dominant Hemiplegia - caused by: Cerebral infarction;Nontraumatic intracerebral hemorrhage;Other Nontraumatic intracranial hemorrhage     Time: 0842-0926 PT Time Calculation (min) (ACUTE ONLY): 44 min  Charges:  $Therapeutic Activity: 23-37 mins                     Lyanne Co, PT  Acute Rehab Services  Pager [phone] Office [phone]    Lawana Chambers Carlea Badour 10/20/2020, 1:18 PM

## 2020-10-20 NOTE — Plan of Care (Signed)
  Problem: Coping: Goal: Will identify appropriate support needs Outcome: Progressing   

## 2020-10-21 LAB — GLUCOSE, CAPILLARY
Glucose-Capillary: 114 mg/dL — ABNORMAL HIGH (ref 70–99)
Glucose-Capillary: 144 mg/dL — ABNORMAL HIGH (ref 70–99)
Glucose-Capillary: 185 mg/dL — ABNORMAL HIGH (ref 70–99)
Glucose-Capillary: 153 mg/dL — ABNORMAL HIGH (ref 70–99)
Glucose-Capillary: 116 mg/dL — ABNORMAL HIGH (ref 70–99)

## 2020-10-21 NOTE — Progress Notes (Signed)
[facility] PROGRESS NOTE  Charlene [name] QIO:[id number] DOB: March 14, 1966 DOA: 08/26/2020 PCP: Patient, [name] (Inactive)    October 08, 2020     10/16/2020                        10/16/2020     10/18/2020                        10/20/2020    Status: Remains inpatient appropriate because:Altered mental status, Unsafe d/c plan, IV treatments appropriate due to intensity of illness or inability to take PO and Inpatient level of care appropriate due to severity of illness   Dispo: The patient is from: Home              Anticipated d/c is to: SNF vs brain injury-family does not wish to place patient in SNF.  If this is the only option they would like to consider taking the patient home although this may be a difficult undertaking given current requirement for trach and understandable fears about managing such complex patient in the home environment.  They have been updated as to why this patient currently is not a candidate for aggressive rehabilitative therapies like would be provided at a brain injury recovery rehab.  **3/24 Husband clarified that he has not delegated POA status to other family members.  **3/25 LCSW called patient's husband at home.  He confirmed that he is okay with daughter managing the Medicaid and disability process and has agreed to change the address to the one requested by the daughter.  Also a letter was requested by daughter to clarify this process and this has been placed in the chart. **3/31 power of attorney documentation has now been placed on the patient's chart.  Documentation stated for Dec 04, 2018              Patient currently is not medically stable to d/c.   Difficult to place patient Yes   Level of care: Progressive  Code Status: Full Family Communication:  extensive conversation via telephone with daughter Charlene [name] [phone]) at bedside on 4/1.; [name] [phone]) is her POA and legal contact.   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 Subsequently on 2/10, she developed obtundation and respiratory arrest leading to intubation. She has remained persistently obtunded, now has a trach and a PEG. Hospital course complicated by persistent fever,Serratia pneumoniaand now pre-orbital cellulitis  As of 3/25 with adjustments and hypertonicity medications patient seems to be improving.  She is also waking up more although limited interaction but both eyes are now open and she tracks with the left eye.  Multiple discussions with husband who has confirmed he has not delegated other family members to be POA and he is the primary legal contact for patient.  He has agreed to let patient's daughter take the lead on completing and following up on Medicaid and disability applications including having these applications sent to her address.  2/09 Admit for [place] 2/10 EVD placed, later intubated  due to obtundation and respiratory arrest 2/11 MRI Brain, hypertonic saline d/c'd, EVD not working. Husband did not consent to PICC  2/13 EVD removed 2/17 [place] discussion with family, PMT 2/24 Bedside trach and PEG 3/1 Transferred to [facility]  Subjective: Sitting upright in bed and eyes open upon entry into the room.  Continues with disconjugate medial focus gaze and left eye although right eye demonstrates independent movement and?  Some tracking detected.  Remains with positive blink reflex.  Remains nonverbal and was unable to follow commands.  No family at bedside.  Objective: Vitals:    10/21/20 0714 10/21/20 0744  BP: 117/68   Pulse: (!) 104 (!) 101  Resp: (!) 24 (!) 26  Temp: 99.3 F (37.4 C)   SpO2: 97% 97%    Intake/Output Summary (Last 24 hours) at 10/21/2020 0752 Last data filed at 10/21/2020 0327 Gross per 24 hour  Intake --  Output 1900 ml  Net -1900 ml   Filed Weights   10/16/20 0403 10/18/20 0500 10/18/20 1613  Weight: 66.7 kg 65.5 kg 67.7 kg    Exam:  Constitutional: Awake with eyes open, nonverbal, no acute distress observed Respiratory: #6.0 cuffless trach, trach collar with FiO2 21% at 5L,O2 sats 97 to 99%.  Anterior lung sounds are clear to auscultation, no increased work of breathing at rest.  Mild tachypnea with respiratory rates between 23 and 27 breaths/min Cardiovascular: Normal heart sounds, persistent tachycardia less than 120 bpm, Abdomen:  PEG tube for continuous tube feedings and medications.  LBM 4/2 Musculoskeletal: Marked decrease in torticollis with associated left neck deviation markedly improved.  Previous hypertonicity significantly improved without any rebound spasticity with PROM Neurologic: CN 2-12 with abnormalities as follows: Blink reflex is present bilaterally although still has some medial disconjugate gaze involving the left eye. Lower extremity foot splints in place. Psych: Eyes open.  Unable to follow commands.  Making no attempts to vocalize.  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 Intraventricular penetration with blood filling the 3rd and forth ventricles and nearly filling the right lateral ventricle.  -MRI 2/11 with intraparenchymal hemorrhage centered within the right thalamus and basal ganglia extending into the right midbrain with intraventricular extension. Effacement of prepontine cistern and  similar 4 mm of leftward midline shift. Separate areas of ristricted diffusion and edema involving the right greater than left mesial temporal lobes / parahippocampal cortex, bilateral basal ganglia, and possibly the upper pons that are concerning for acute infarcts, most likely 2/2 mass effect from the hemorrhage (see report)/ Per neuro "scattered b/l hippocampus, posterior CC, bilateral thalamus, and upper pons, likely due to deep penetrating vessel compression by focal mass-effect".  -2/19 MRI with acute to early subacute infarcts in the bilateral cerebral white matter and right cerebellum, new from 2/11 - interval evolution of multiple other infarcts present on prior MRI, increased hemorrhage along old right EVD tract (see report) - 2/21 head CT with expected evolution of ICH since 2/13 - Echo with normal EF, no intracardiac source of embolism - CTA head/neck without intracranial arterial occlusion or high grade stenosis, no dissection, aneurysm, or hemodynamically significant stenosis of the carotid or vertebral arteries - Head CT 3/9 with expected evolution of hemorrhage involving the right thalamus, basal ganglia, and adjacent white matter from prior exam with developing encephalomalacia.  Resolved previous intraventricular hemorrhage.  Persistent sulcal effacement with  mild improvement.  Persistent 3 mm left to right midline shift.  Multiple areas of low attenuation consistent with infarcts within the central white matter, unchanged in distribution from prior.  (see report) - Per neurology- see last follow up note from 2/25 - [place] likely 2/2 hypertension and nonadherence to meds  - s/p trach/peg on 2/24  - no antithrombotic 2/2 ICH -Obstructive hydrocephalus -> s/p external ventricular catheter placement on 08/27/20 by neurosurgery - EVD clamped on 2/12 and d/c'd on 2/13 -EEG suggestive of cortical dysfunction in right hemisphere likely 2/2 underlying bleed as well as severe diffuse  encephalopathy, nonspecific etiology . -Continue baclofen 5 mg at 8 AM and 4 PM and 7.5 mg at bedtime.  LFTs remain stable with baseline subtle elevation without change since initiation of baclofen. -Continue Provigil 200 mg daily as recommended by PMR team -3/22 EEG no seizures and persistent encephalopathy -Prognosis remains guarded and it is highly doubtful she will return to previous level of functioning therefore financial counseling notified to pursue disability application -Add scheduled Senokot at HS given requirement for [facility] -Per PT and OT notes dated 4/5 patient not able to participate with therapies.  Likely will sign off soon but can be reconsulted if patient is noted with improvement -Begin out of bed to chair daily and PROM every 4 hours per nursing staff   Chronic pansinusitis/recurrent fevers 2/2 aspiration PNA;History of Serratia Pneumonia  Hospital Acquired Pneumonia:  -Tracheal aspirate from 2/25 and 3/1 with serratia - cefepime 2/27-3/5; repeat sputum cx 3/21 + serratia (likely colonization but w/ recurrent fever opted to treat) -Has completed antibiotics recently for bilateral orbital cellulitis -started on Cipro for tracheal aspirate positive for Serratia marcescens   -Repeat sinus CT demonstrates progressive complete bilateral maxillary sinus opacification with unchanged complete left sphenoid sinus opacification and right sphenoid sinus fluid c/w chronic state  -CT Chest 3/30 with aspiration PNA-dtr updated at bedside by attending MD- Cipro changed to Unasyn IV-Unasyn now completed -no increased O2 needs  Neck Contracture/torticollis -Preference for keeping head turned to the left. -Continue baclofen as above -- continue PT/OT weekly per their recommendations given patient's inability to effectively participate at this juncture.  Nursing staff performing PROM activities.  Family also instructed and has been performing intermittently. PT eval 4/5   Pt received  supine in bed, eyes open. Maximove used to transfer pt bed to recliner. Pt tolerated this well with stable HR, O2 sats, and BP but did not engage with this any more than she has when working in supine or seated EOB. Mother present and education given on positioning in the seated position. Pt not demonstrating volitional mvmt or following any commands. We are reaching the point where pt will not longer be appropriate for acute PT due to her status not changing with stimulation and family training on ROM and positioning being close to done. New goals set for next 2 weeks but expect to sign off at that point.    OT eval 4/5:  Patient supine in bed, engaged in OT/PT session.   Patient remains with eyes partially open during session, not following commands or demonstrating volitional movements. Total assist +2 to place maximove sling and lift to recliner, positioned in recliner supported by pillows- VSS throughout session.  PROM to L UE completed, question L elbow contracture forming as noted resistance to elbow extension with combination of tone and resistance for -45 degree full extension. Educated mother on elbow ROM importance. Will continue per plan with focus on  family education, positioning, splinting and ROM.    Hypertensive emergency/Malignant HTN Resolved -Home medications include Coreg and HCTZ which have not been reordered -Continue metoprolol, hydralazine  and amlodipine -Blood pressure much better controlled after hypertonicity and pain treated  Acute hypoxemic respiratory failure secondary to CVA requiring mechanical ventilation and tracheostomy tube -Stable on trach collar and has been transitioned to a #6 cuffless trach -Has significant altered mentation and inability to manage oral secretions at present time is not a safe candidate for decannulation.  Discussed this with family as major obstacle in discharging to home.  This will also decrease options for SNF placement. -Appreciate trach  team assistance in management  Pain likely related to underlying hypertonicity -Morphine IV and Tylenol prn (watch elev LFTs) -Treat underlying hypertonicity with skeletal muscle relaxers and uptitrate as tolerated with goal to improve and resolve hypertonicity without contributing to oversedation  Dysphagia/Nutrition Status: Nutrition Problem: Inadequate oral intake Etiology: inability to eat Signs/Symptoms: NPO status Interventions: Tube feeding consisting of Osmolite 1.2 Cal at 55 cc/h; continue Prosource liquid 45 cc daily, free water 100 cc every 4 hours per tube; will also continue to follow CBGs regularly while on tube feedings and provide SSI Estimated body mass index is 24.84 kg/m as calculated from the following:   Height as of this encounter: '5\' 5"'  (1.651 m).   Weight as of this encounter: 67.7 kg. If patient eventually discharged to home to teach family how to bolus feed as opposed to utilizing continuous tube feeding infusions.   Other problems: Anasarca -bilateral upper extremity edema, minimal LE edema - suspect related to prolonged hospitalization, hypoalbuminemia -upper extremity US-> age indeterminate SVT of right cephalic vein  Elevated LFT's - Remain elevated but stable without any obstructive pattern -Continue baclofen and statin  Bacterial Conjunctivitis/periorbital cellulitis - MRI 3/7 orbits with L periorbital cellulitis  -- CT head 3/9 with persistent subtotal opacification of paranasal sinuses and mastoid air cells, bubbly lucency at base of R maxillary sinus (likely odontogenic in origin) - CT maxillofacial 3/12 - improving cellulitis  - s/p erythromycin ointment - completed course of Ancef   Anemia -Stable, follow  Sinus Tachycardia Better on BB and after treatment of pain/hypertonicity Echo with EF 60-65% (see report)   Data Reviewed: Basic Metabolic Panel: Recent Labs  Lab 10/15/20 0309 10/15/20 0734 10/17/20 0328 10/19/20 0510  NA  138  --  137 140  K 3.2*  --  4.2 4.1  CL 101  --  103 102  CO2 28  --  25 27  GLUCOSE 151*  --  122* 136*  BUN 23*  --  17 20  CREATININE 0.80  --  0.65 0.69  CALCIUM 9.0  --  9.3 9.3  MG  --  2.1  --   --    Liver Function Tests: Recent Labs  Lab 10/15/20 0309 10/17/20 0328 10/19/20 0510  AST 34 58* 45*  ALT 64* 101* 78*  ALKPHOS 91 102 93  BILITOT 0.4 0.5 0.4  PROT 6.6 7.5 7.1  ALBUMIN 2.6* 2.8* 2.6*   No results for input(s): LIPASE, AMYLASE in the last 168 hours. No results for input(s): AMMONIA in the last 168 hours. CBC: Recent Labs  Lab 10/17/20 0328  WBC 8.5  NEUTROABS 4.5  HGB 10.3*  HCT 32.3*  MCV 82.6  PLT 346   Cardiac Enzymes: No results for input(s): CKTOTAL, CKMB, CKMBINDEX, TROPONINI in the last 168 hours. BNP (last 3 results) No results for input(s): BNP in  the last 8760 hours.  ProBNP (last 3 results) No results for input(s): PROBNP in the last 8760 hours.  CBG: Recent Labs  Lab 10/20/20 1529 10/20/20 1944 10/20/20 2319 10/21/20 0326 10/21/20 0747  GLUCAP 148* 126* 141* 114* 144*    No results found for this or any previous visit (from the past 240 hour(s)).   Studies: No results found.  Scheduled Meds: . amLODipine  5 mg Per Tube Daily  . atorvastatin  40 mg Per Tube Daily  . baclofen  5 mg Per Tube 2 times per day  . baclofen  7.5 mg Per Tube Daily  . chlorhexidine gluconate (MEDLINE KIT)  15 mL Mouth Rinse BID  . Chlorhexidine Gluconate Cloth  6 each Topical Daily  . enoxaparin (LOVENOX) injection  40 mg Subcutaneous Q24H  . feeding supplement (PROSource TF)  45 mL Per Tube Daily  . free water  100 mL Per Tube Q4H  . hydrALAZINE  50 mg Per Tube Q8H  . insulin aspart  0-9 Units Subcutaneous Q4H  . mouth rinse  15 mL Mouth Rinse 5 X Daily  . metoprolol tartrate  125 mg Per Tube BID  . modafinil  200 mg Per Tube Daily  . pantoprazole sodium  40 mg Per Tube BID  . scopolamine  1 patch Transdermal Q72H  . sennosides  5 mL  Per Tube QHS   Continuous Infusions: . sodium chloride Stopped (09/28/20 1344)  . feeding supplement (OSMOLITE 1.2 CAL) 1,000 mL (10/20/20 1554)    Principal Problem:   ICH (intracerebral hemorrhage) (HCC) Active Problems:   Intraparenchymal hemorrhage of brain (HCC)   Acute hypoxemic respiratory failure (HCC)   Fever   Endotracheal tube present   Status post tracheostomy ([place])   Palliative care by specialist   Muscle hypertonicity   Oropharyngeal dysphagia   Brain injury with loss of consciousness ([place])   Poor prognosis   Aspiration pneumonia of right lower lobe [facility])   Consultants:  Palliative care  Pulm  Neurology   Procedures:  Tracheostomy  PEG tube  EEG  Echocardiogram  Antibiotics: Anti-infectives (From admission, onward)   Start     Dose/Rate Route Frequency Ordered Stop   10/14/20 1815  Ampicillin-Sulbactam (UNASYN) 3 g in sodium chloride 0.9 % 100 mL IVPB        3 g 200 mL/hr over 30 Minutes Intravenous Every 6 hours 10/14/20 1754 10/19/20 1340   10/08/20 0845  ciprofloxacin (CIPRO) tablet 500 mg  Status:  Discontinued        500 mg Per Tube 2 times daily 10/08/20 0754 10/14/20 1731   09/21/20 1545  ceFAZolin (ANCEF) IVPB 1 g/50 mL premix        1 g 100 mL/hr over 30 Minutes Intravenous Every 8 hours 09/21/20 1457 09/28/20 0647   09/20/20 0600  ceFAZolin (ANCEF) IVPB 1 g/50 mL premix  Status:  Discontinued        1 g 100 mL/hr over 30 Minutes Intravenous Every 8 hours 09/14/20 0903 09/14/20 0904   09/16/20 1300  vancomycin (VANCOREADY) IVPB 1250 mg/250 mL  Status:  Discontinued        1,250 mg 166.7 mL/hr over 90 Minutes Intravenous Every 24 hours 09/15/20 1232 09/17/20 0956   09/15/20 1230  vancomycin (VANCOREADY) IVPB 1250 mg/250 mL        1,250 mg 166.7 mL/hr over 90 Minutes Intravenous  Once 09/15/20 1131 09/15/20 1402   09/13/20 1400  ceFEPIme (MAXIPIME) 2 g in sodium chloride 0.9 %  100 mL IVPB        2 g 200 mL/hr over 30 Minutes  Intravenous Every 8 hours 09/13/20 1341 09/19/20 2208   09/06/20 0600  vancomycin (VANCOREADY) IVPB 1250 mg/250 mL        1,250 mg 166.7 mL/hr over 90 Minutes Intravenous  Once 09/06/20 0541 09/06/20 0739   09/05/20 1400  ceFAZolin (ANCEF) IVPB 1 g/50 mL premix  Status:  Discontinued        1 g 100 mL/hr over 30 Minutes Intravenous Every 8 hours 09/05/20 1120 09/13/20 1341   08/30/20 0600  vancomycin (VANCOREADY) IVPB 500 mg/100 mL  Status:  Discontinued        500 mg 100 mL/hr over 60 Minutes Intravenous Every 12 hours 08/29/20 1554 09/01/20 1115   08/29/20 1700  ceFEPIme (MAXIPIME) 2 g in sodium chloride 0.9 % 100 mL IVPB  Status:  Discontinued        2 g 200 mL/hr over 30 Minutes Intravenous Every 12 hours 08/29/20 1554 09/01/20 1115   08/29/20 1645  vancomycin (VANCOREADY) IVPB 1250 mg/250 mL        1,250 mg 166.7 mL/hr over 90 Minutes Intravenous  Once 08/29/20 1554 08/29/20 1820       Time spent: 35 minutes    Erin Hearing ANP  [facility] 7 am - 330 pm/M-F for direct patient care and secure chat Please refer to Amion for contact info 55  days

## 2020-10-22 DIAGNOSIS — I10 Essential (primary) hypertension: Secondary | ICD-10-CM

## 2020-10-22 LAB — CBC
HCT: 31.2 % — ABNORMAL LOW (ref 36.0–46.0)
Hemoglobin: 9.8 g/dL — ABNORMAL LOW (ref 12.0–15.0)
MCH: 25.8 pg — ABNORMAL LOW (ref 26.0–34.0)
MCHC: 31.4 g/dL (ref 30.0–36.0)
MCV: 82.1 fL (ref 80.0–100.0)
Platelets: 390 10*3/uL (ref 150–400)
RBC: 3.8 MIL/uL — ABNORMAL LOW (ref 3.87–5.11)
RDW: 16.1 % — ABNORMAL HIGH (ref 11.5–15.5)
WBC: 11.3 10*3/uL — ABNORMAL HIGH (ref 4.0–10.5)
nRBC: 0 % (ref 0.0–0.2)

## 2020-10-22 LAB — GLUCOSE, CAPILLARY
Glucose-Capillary: 134 mg/dL — ABNORMAL HIGH (ref 70–99)
Glucose-Capillary: 135 mg/dL — ABNORMAL HIGH (ref 70–99)
Glucose-Capillary: 144 mg/dL — ABNORMAL HIGH (ref 70–99)
Glucose-Capillary: 157 mg/dL — ABNORMAL HIGH (ref 70–99)
Glucose-Capillary: 165 mg/dL — ABNORMAL HIGH (ref 70–99)
Glucose-Capillary: 168 mg/dL — ABNORMAL HIGH (ref 70–99)
Glucose-Capillary: 193 mg/dL — ABNORMAL HIGH (ref 70–99)

## 2020-10-22 MED ORDER — AMLODIPINE BESYLATE 10 MG PO TABS
10.0000 mg | ORAL_TABLET | Freq: Every day | ORAL | Status: DC
  Administered 2020-10-22: 10 mg
  Filled 2020-10-22 (×2): qty 1

## 2020-10-22 NOTE — Progress Notes (Signed)
NAME:  Charlene [name], MRN:  [id number], DOB:  05/16/1966, LOS: 56 ADMISSION DATE:  08/26/2020, CONSULTATION DATE:  2/10 REFERRING MD:  Dr. Roda Shutters, CHIEF COMPLAINT:  ICH   Brief History:  55 year old female admitted with hypertensive R thalamic/basal ganglia ICH. Resultant obstructive hydrocephalus prompting EVD placement 2/10. Subsequently, 2/10 she had acute mental status change to obtundation and respiratory arrest resulting in intubation. Trach and Peg 2/24. Trach change to #6 cuffless on 3/11  Past Medical History:  HTN, medication noncompliance  Significant Hospital Events:  2/09 Admit for ICH 2/10 EVD placed, later intubated due to obtundation and respiratory arrest 2/11 MRI Brain, hypertonic saline d/c'd, EVD not working.  Husband did not consent to PICC  2/13 EVD removed 2/17 GOC discussion with family, PMT 3/11 trach changed to [place] 6 uncuffed  Consults:  Stroke PCCM Neurosurgery  Procedures:  ETT 2/10 >> 09/10/2020 EVD 2/10 > non functioning 2/11 at 1500; clamped 2/12 am > 2/13 Trach and diagnostic Bronchoscopy 2/24>> Dr. Denese Killings PEG placement 2/24>> Dr. Sheliah Hatch  Significant Diagnostic Tests:  CT Head 2/9 >> Acute hemorrhage with the epicenter in the right thalamus. Measurement is approximately 3.2 x 2.1 x 2.5 cm (volume 8.8 cm^3). Intraventricular penetration with blood filling the third and fourth ventricles and nearly filling the right lateral ventricle. No hydrocephalus. CTA Head/Neck 2/10 >> Unchanged size of intraparenchymal hemorrhage centered in the right thalamus and basal ganglia with extension into the right lateral ventricle. 2. No intracranial arterial occlusion or high-grade stenosis. No dissection, aneurysm or hemodynamically significant stenosis of the carotid or vertebral arteries. CT Head 2/10 >> Stable parenchymal hemorrhage in the right thalamus and basal ganglia with intraventricular extension as described. No new focal abnormality is noted. CT Head  2/10 (post intubation) >> redemonstrated intraparenchymal hemorrhage centered within the R thalamus/basal ganglia extending into the cerebral peduncle/midbrain with associated intraventricular extension and hemorrhage in the R lateral ventricle and fourth ventricle, overall not significantly changed from prior; stable mass effect, 62mm of R to L midline shift, postsurgical changes of ventriculostomy TTE 2/10 >> LVEF 60 to 65%, LV has normal function, no regional wall motion abnormalities, mild concentric LVH.  RV systolic function is normal, normal pulmonary artery systolic pressure.  MRA head 2/11 >>Negative intracranial MR angiography. No evidence of aneurysm or high flow vascular malformation to explain the intraparenchymal hemorrhage  MRI brain 2/11 >> Similar intraparenchymal hemorrhage centered within the right thalamus and basal ganglia extending into the right midbrain with intraventricular extension, effacement of the prepontine cistern and similar 4 mm of leftward midline shift; separate areas of restricted diffusion and edema involving the R > L mesial temporal lobes/parahippocampal cortex, bilateral basal ganglia, and possibly the upper pons that are c/f acute infarcts, most likely secondary to mass effect from hemorrhage. MRI brain 2/19 >> intraventricular clot has decreased in size but mid-brain parenchymal clot has not and there are more ischemic infarcts in the surrounding brain.  CT Head WO Contrast 09/07/20>> Expected evolution of intracranial hemorrhage since 08/30/2020 head CT. Multiple areas of low attenuation corresponding to some of the infarcts seen on prior MRI. Ventricle caliber similar to prior MRI   Micro Data:  2/9 COVID + Influenza negative 2/10 MRSA PCR negative 2/12 BCx 2 > no growth  2/19 BC 1/2 +Stap EPI, 1/2 no growth  10/07/2020 sputum positive for Serratia marcescens  Antimicrobials:  Cefepime 2/12 > 2/15 Vanc 2/12 > 2/15 10/08/2020 ciprofloxacin>>completed.    Interim History / Subjective:  Looks the same  Objective   Blood pressure 122/62, pulse 93, temperature 99.3 F (37.4 C), temperature source Oral, resp. rate (Abnormal) 25, height 5\' 5"  (1.651 m), weight 67.7 kg, last menstrual period 03/03/2014, SpO2 99 %, unknown if currently breastfeeding.    FiO2 (%):  [21 %] 21 %   Intake/Output Summary (Last 24 hours) at 10/22/2020 0940 Last data filed at 10/22/2020 0738 Gross per 24 hour  Intake 660 ml  Output 1100 ml  Net -440 ml   Filed Weights   10/16/20 0403 10/18/20 0500 10/18/20 1613  Weight: 66.7 kg 65.5 kg 67.7 kg    General this is a 55 year old female. She remains in PVS and trach dependent HENT 6 cuffless trach w/ no sig tracheal secretions pulm clear  Card rrr abd soft Ext contracted Neuro awake does not follow commands.   Resolved Hospital Problem list   Hypernatremia  Obstructive hydrocephalus (resolved) Hypertensive emergency Acute hypoxemic respiratory failure secondary to CVA Serratia PNA  Bacterial Conjunctivitis   Assessment & Plan:   40 dependent s/p Multiple acute strokes c/b Intraparenchymal hemorrhage with right thalamic hemorrhage with extension into the third fourth and right lateral ventricle Dysphagia s/p PEG Anemia   Active Pulm problem list  Trach dependent s/p Multiple acute strokes c/b Intraparenchymal hemorrhage with right thalamic hemorrhage with extension into the third fourth and right lateral ventricle  Still not a candidate for decannulation  Plan: Cont routine trach care We will cont to see weekly.   [name] ACNP-BC [facility] Pulmonary/Critical Care Pager # [phone] OR # [phone] if no answer

## 2020-10-22 NOTE — Progress Notes (Signed)
Noted increase in pt's temperature to 100.58F and pt seemed overall uncomfortable. Medicated with 650 mg APAP suppository and 5 mg Oxycontin per PEG. Her blood pressure is also elevated to 172/94 after having given her nightly medication. Covered her blood pressure with 5mg  Hydralazine SIVP (1st line med). Will continue to monitor pt and follow up with interventions as needed.

## 2020-10-22 NOTE — Progress Notes (Signed)
Occupational Therapy Treatment Patient Details Name: Charlene [name] MRN: [id number] DOB: [birth year] Today's Date: 10/22/2020    History of present illness Pt is 55 yo female presents to [facility] on 2/9 with L sided weakness and headaches. PMH includes anxiety, HTN. 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 s/p Right frontal ventricular catheter placement on 2/10, stopped functioning on 2/11.  MRI 2/11 shows right greater than left mesial temporal lobes / parahippocampal cortex, bilateral basal ganglia, and possibly the upper pons that are concerning for acute infarcts, most likely secondary to mass effect from the hemorrhage. ETT 2/10-present.  Pt with trach and PEG on 09/10/20. Hospital course complicated by persistent fever, Serratia pneumonia and now pre-orbital cellulitis.   OT comments  Patient supine in bed, session focus on distinguishing patients visual input and command following.  In a quiet environment with no distractions, patient appears to track R eye 1/3 of the way to midline (up to 50% by end of session) towards L when given min to mod cueing and fixating on therapist approx 50% of the time (3/6 trials) for 1 second.  Also, given 1 step simple command, mod cueing (using her name before cue) and up to 3 minute delay (with R hand in optimal position- wrist supported) pt able to move R thumb. With seeing what appears to be minimal visual tracking, fixating, and thumb movement volitionally (although it is still hard to determine vs spasticity at times), OT will give patient a trial of 2x/week visits to focus on and strengthen these skills in hopes to allow to progress towards basic communication of distinguishing between 2 items or pictures (yes/no) or strengthening "thumbs up". Plan to occlude L eye with nasal occlusion glasses to strengthen R eye. Also  discussed with RN option of moving patient to opposite side of hallway to provide more stimulation/input towards L side for encouragement of patient to scan towards midline with R eye. Updated goals.    Follow Up Recommendations  SNF;Supervision/Assistance - 24 hour    Equipment Recommendations  Hospital bed;Wheelchair cushion (measurements OT);Wheelchair (measurements OT);Other (comment)    Recommendations for Other Services      Precautions / Restrictions Precautions Precautions: Fall Precaution Comments: peg and trach       Mobility Bed Mobility                    Transfers                      Balance                                           ADL either performed or assessed with clinical judgement   ADL                                               Vision   Additional Comments: Pt with R gaze, B R beating nystagmus and dysconjugate gaze with L eye adducted and downward. WIth focused attention, pt appears to track R eye 1/3 of the way to midline (up to 50% by end of session) towards L when given min  to mod cueing and fixating on therapist approx 50% of the time (3/6 trials).   Perception     Praxis      Cognition Arousal/Alertness: Awake/alert Behavior During Therapy: Flat affect Overall Cognitive Status: Difficult to assess                                 General Comments: pt with visual tracking (see below for details), given 1 step simple command, mod cueing (using her name before cue) and up to 3 minute delay (with R hand in optimal position- wrist supported) pt able to move R thumb        Exercises     Shoulder Instructions       General Comments session focused on simple commands to determine visual tracking, visual fixation and command following    Pertinent Vitals/ Pain       Pain Assessment: Faces Faces Pain Scale: No hurt  Home Living                                           Prior Functioning/Environment              Frequency  Min 2X/week        Progress Toward Goals  OT Goals(current goals can now be found in the care plan section)  Progress towards OT goals: Progressing toward goals  Acute Rehab OT Goals Patient Stated Goal: pt's family wants her to go to intense rehab  Plan Discharge plan remains appropriate;Discharge plan needs to be updated    Co-evaluation                 AM-PAC OT "6 Clicks" Daily Activity     Outcome Measure   Help from another person eating meals?: Total Help from another person taking care of personal grooming?: Total Help from another person toileting, which includes using toliet, bedpan, or urinal?: Total Help from another person bathing (including washing, rinsing, drying)?: Total Help from another person to put on and taking off regular upper body clothing?: Total Help from another person to put on and taking off regular lower body clothing?: Total 6 Click Score: 6    End of Session    OT Visit Diagnosis: Other symptoms and signs involving the nervous system (R29.898);Other symptoms and signs involving cognitive function;Other (comment) Symptoms and signs involving cognitive functions: 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

## 2020-10-22 NOTE — Progress Notes (Signed)
[facility] PROGRESS NOTE  Charlene [name] WNI:[id number] DOB: [birth year] DOA: 08/26/2020 PCP: Patient, [name] (Inactive)    October 08, 2020     10/16/2020                        10/16/2020     10/18/2020                        10/20/2020    Status: Remains inpatient appropriate because:Altered mental status, Unsafe d/c plan, IV treatments appropriate due to intensity of illness or inability to take PO and Inpatient level of care appropriate due to severity of illness   Dispo: The patient is from: Home              Anticipated d/c is to: SNF vs brain injury-family does not wish to place patient in SNF.  If this is the only option they would like to consider taking the patient home although this may be a difficult undertaking given current requirement for trach and understandable fears about managing such complex patient in the home environment.  They have been updated as to why this patient currently is not a candidate for aggressive rehabilitative therapies like would be provided at a brain injury recovery rehab.  **3/24 Husband clarified that he has not delegated POA status to other family members.  **3/25 LCSW called patient's husband at home.  He confirmed that he is okay with daughter managing the Medicaid and disability process and has agreed to change the address to the one requested by the daughter.  Also a letter was requested by daughter to clarify this process and this has been placed in the chart. **3/31 power of attorney documentation has now been placed on the patient's chart.  Documentation stated for Dec 04, 2018              Patient currently is not medically stable to d/c.   Difficult to place patient Yes   Level of care: Progressive  Code Status: Full Family Communication:  extensive conversation via telephone with [name] [phone]) at bedside on 4/1.; [name] [phone]) is her POA and legal contact.  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 Subsequently on 2/10, she developed obtundation and respiratory arrest leading to intubation. She has remained persistently obtunded, now has a trach and a PEG. Hospital course complicated by persistent fever,Serratia pneumoniaand now pre-orbital cellulitis  As of 3/25 with adjustments and hypertonicity medications patient seems to be improving.  She is also waking up more although limited interaction but both eyes are now open and she tracks with the left eye.  Multiple discussions with husband who has confirmed he has not delegated other family members to be POA and he is the primary legal contact for patient.  He has agreed to let patient's daughter take the lead on completing and following up on Medicaid and disability applications including having these applications sent to her address.  2/09 Admit for ICH 2/10 EVD  placed, later intubated due to obtundation and respiratory arrest 2/11 MRI Brain, hypertonic saline d/c'd, EVD not working. Husband did not consent to PICC  2/13 EVD removed 2/17 [place] discussion with family, PMT 2/24 Bedside trach and PEG 3/1 Transferred to [facility]  Subjective: Eyes open-no response to questions asked, no tracking and not able to [facility]  Objective: Vitals:   10/22/20 0758 10/22/20 0800  BP: (!) 147/95 (!) 147/95  Pulse: 92 93  Resp: (!) 24 (!) 25  Temp:    SpO2: 99% 99%    Intake/Output Summary (Last 24 hours) at 10/22/2020 0818 Last data filed at 10/22/2020 0738 Gross per 24 hour   Intake 660 ml  Output 1100 ml  Net -440 ml   Filed Weights   10/16/20 0403 10/18/20 0500 10/18/20 1613  Weight: 66.7 kg 65.5 kg 67.7 kg    Exam:  Constitutional: Eyes open, appears comfortable Respiratory: #6.0 cuffless trach, trach collar with FiO2 21% at 5L,O2 sats 97 to 99%. Coarse but clear anteriorly, no increased WOB, no tachypnea Cardiovascular: S1S2, occ tachycardia, BP trending up, no peripheral edema Abdomen:  PEG tube, NT, normoactive BS, LBM 4/2 Musculoskeletal: Marked decrease in torticollis with associated left neck deviation markedly improved.  Previous hypertonicity significantly improved without any rebound spasticity with PROM Neurologic: CN 2-12 with abnormalities as follows: Blink reflex is present bilaterally although still has some medial disconjugate gaze involving the left eye. Lower extremity foot splints in place. 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 Intraventricular penetration with blood filling the 3rd and forth ventricles and nearly filling the right lateral ventricle.  -MRI 2/11 with intraparenchymal hemorrhage centered within the right thalamus and basal ganglia extending into the right midbrain with intraventricular extension. Effacement of prepontine cistern and similar 4 mm of leftward midline shift. Separate areas of ristricted diffusion and edema involving the right greater than left mesial temporal lobes / parahippocampal cortex, bilateral basal ganglia, and possibly the upper pons that are concerning for acute infarcts, most likely 2/2 mass effect from the hemorrhage (see report)/ Per neuro "scattered b/l hippocampus, posterior CC, bilateral thalamus, and  upper pons, likely due to deep penetrating vessel compression by focal mass-effect".  -2/19 MRI with acute to early subacute infarcts in the bilateral cerebral white matter and right cerebellum, new from 2/11 - interval evolution of multiple other infarcts present on prior MRI, increased hemorrhage along old right EVD tract (see report) - 2/21 head CT with expected evolution of ICH since 2/13 - Echo with normal EF, no intracardiac source of embolism - CTA head/neck without intracranial arterial occlusion or high grade stenosis, no dissection, aneurysm, or hemodynamically significant stenosis of the carotid or vertebral arteries - Head CT 3/9 with expected evolution of hemorrhage involving the right thalamus, basal ganglia, and adjacent white matter from prior exam with developing encephalomalacia.  Resolved previous intraventricular hemorrhage.  Persistent sulcal effacement with mild improvement.  Persistent 3 mm left to right midline shift.  Multiple areas of low attenuation consistent with infarcts within the central white matter, unchanged in distribution from prior.  (see report) - Per neurology- see last follow up note from 2/25 - ICH likely 2/2 hypertension and nonadherence to meds  - s/p trach/peg on 2/24  - no antithrombotic 2/2 ICH -Obstructive hydrocephalus ->  s/p external ventricular catheter placement on 08/27/20 by neurosurgery - EVD clamped on 2/12 and d/c'd on 2/13 -EEG suggestive of cortical dysfunction in right hemisphere likely 2/2 underlying bleed as well as severe diffuse encephalopathy, nonspecific etiology . -Continue baclofen 5 mg at 8 AM and 4 PM and 7.5 mg at bedtime.  LFTs remain stable with baseline subtle elevation without change since initiation of baclofen. -Continue Provigil 200 mg daily as recommended by PMR team -3/22 EEG no seizures and persistent encephalopathy -Prognosis remains guarded and it is highly doubtful she will return to previous level of functioning  therefore financial counseling notified to pursue disability application -Add scheduled Senokot at HS given requirement for [facility] -Per PT and OT notes dated 4/5 patient not able to participate with therapies.  Likely will sign off soon but can be reconsulted if patient is noted with improvement -Begin out of bed to chair daily and PROM every 4 hours per nursing staff  Recurrent low-grade fever -Likely secondary to combination of atelectasis and subtle aspiration noting these fevers particularly occur in the middle of the night -Check CBC -No increased O2 needs -Check urinalysis and urine culture  Chronic pansinusitis/recurrent fevers 2/2 aspiration PNA;History of Serratia Pneumonia  Hospital Acquired Pneumonia:  -Tracheal aspirate from 2/25 and 3/1 with serratia - cefepime 2/27-3/5; repeat sputum cx 3/21 + serratia (likely colonization but w/ recurrent fever opted to treat) -Has completed antibiotics recently for bilateral orbital cellulitis -started on Cipro for tracheal aspirate positive for Serratia marcescens   -Repeat sinus CT demonstrates progressive complete bilateral maxillary sinus opacification with unchanged complete left sphenoid sinus opacification and right sphenoid sinus fluid c/w chronic state  -CT Chest 3/30 with aspiration PNA-dtr updated at bedside by attending MD- Cipro changed to Unasyn IV-Unasyn now completed -no increased O2 needs  Neck Contracture/torticollis -Preference for keeping head turned to the left. -Continue baclofen as above -- continue PT/OT weekly per their recommendations given patient's inability to effectively participate at this juncture.  Nursing staff performing PROM activities.  Family also instructed and has been performing intermittently. PT eval 4/5   Pt received supine in bed, eyes open. Maximove used to transfer pt bed to recliner. Pt tolerated this well with stable HR, O2 sats, and BP but did not engage with this any more than she has when  working in supine or seated EOB. Mother present and education given on positioning in the seated position. Pt not demonstrating volitional mvmt or following any commands. We are reaching the point where pt will not longer be appropriate for acute PT due to her status not changing with stimulation and family training on ROM and positioning being close to done. New goals set for next 2 weeks but expect to sign off at that point.    OT eval 4/5:  Patient supine in bed, engaged in OT/PT session.   Patient remains with eyes partially open during session, not following commands or demonstrating volitional movements. Total assist +2 to place maximove sling and lift to recliner, positioned in recliner supported by pillows- VSS throughout session.  PROM to L UE completed, question L elbow contracture forming as noted resistance to elbow extension with combination of tone and resistance for -45 degree full extension. Educated mother on elbow ROM importance. Will continue per plan with focus on family education, positioning, splinting and ROM.    Hypertensive emergency/Malignant HTN Resolved -Home medications include Coreg and HCTZ which have not been reordered -Continue metoprolol, hydralazine.4/7 will increase amlodipine to 10 mg  daily noting intermittent episodes of elevated blood pressure -Continue to treat underlying causes such as pain.  Acute hypoxemic respiratory failure secondary to CVA requiring mechanical ventilation and tracheostomy tube -Stable on trach collar and has been transitioned to a #6 cuffless trach -Has significant altered mentation and inability to manage oral secretions at present time is not a safe candidate for decannulation.  Discussed this with family as major obstacle in discharging to home.  This will also decrease options for SNF placement. -Appreciate trach team assistance in management -See above regarding evaluation for recurrent low-grade fevers.  Pain likely related to  underlying hypertonicity -Morphine IV, oxycodone and Tylenol prn (watch elev LFTs) -Treat underlying hypertonicity with skeletal muscle relaxers and uptitrate as tolerated with goal to improve and resolve hypertonicity without contributing to oversedation  Dysphagia/Nutrition Status: Nutrition Problem: Inadequate oral intake Etiology: inability to eat Signs/Symptoms: NPO status Interventions: Tube feeding consisting of Osmolite 1.2 Cal at 55 cc/h; continue Prosource liquid 45 cc daily, free water 100 cc every 4 hours per tube; will also continue to follow CBGs regularly while on tube feedings and provide SSI Estimated body mass index is 24.84 kg/m as calculated from the following:   Height as of this encounter: _0  (1.651 m).   Weight as of this encounter: 67.7 kg. If patient eventually discharged to home to teach family how to bolus feed as opposed to utilizing continuous tube feeding infusions.   Other problems: Anasarca -bilateral upper extremity edema, minimal LE edema - suspect related to prolonged hospitalization, hypoalbuminemia -upper extremity US-> age indeterminate SVT of right cephalic vein  Elevated LFT's - Remain elevated but stable without any obstructive pattern -Continue baclofen and statin  Bacterial Conjunctivitis/periorbital cellulitis - MRI 3/7 orbits with L periorbital cellulitis  -- CT head 3/9 with persistent subtotal opacification of paranasal sinuses and mastoid air cells, bubbly lucency at base of R maxillary sinus (likely odontogenic in origin) - CT maxillofacial 3/12 - improving cellulitis  - s/p erythromycin ointment - completed course of Ancef   Anemia -Stable, follow  Sinus Tachycardia Better on BB and after treatment of pain/hypertonicity Echo with EF 60-65% (see report)   Data Reviewed: Basic Metabolic Panel: Recent Labs  Lab 10/17/20 0328 10/19/20 0510  NA 137 140  K 4.2 4.1  CL 103 102  CO2 25 27  GLUCOSE 122* 136*  BUN 17 20   CREATININE 0.65 0.69  CALCIUM 9.3 9.3   Liver Function Tests: Recent Labs  Lab 10/17/20 0328 10/19/20 0510  AST 58* 45*  ALT 101* 78*  ALKPHOS 102 93  BILITOT 0.5 0.4  PROT 7.5 7.1  ALBUMIN 2.8* 2.6*   No results for input(s): LIPASE, AMYLASE in the last 168 hours. No results for input(s): AMMONIA in the last 168 hours. CBC: Recent Labs  Lab 10/17/20 0328  WBC 8.5  NEUTROABS 4.5  HGB 10.3*  HCT 32.3*  MCV 82.6  PLT 346   Cardiac Enzymes: No results for input(s): CKTOTAL, CKMB, CKMBINDEX, TROPONINI in the last 168 hours. BNP (last 3 results) No results for input(s): BNP in the last 8760 hours.  ProBNP (last 3 results) No results for input(s): PROBNP in the last 8760 hours.  CBG: Recent Labs  Lab 10/21/20 1141 10/21/20 1608 10/21/20 1930 10/22/20 0001 10/22/20 0303  GLUCAP 185* 153* 116* 134* 168*    No results found for this or any previous visit (from the past 240 hour(s)).   Studies: No results found.  Scheduled Meds: . amLODipine  10 mg Per Tube Daily  . atorvastatin  40 mg Per Tube Daily  . baclofen  5 mg Per Tube 2 times per day  . baclofen  7.5 mg Per Tube Daily  . chlorhexidine gluconate (MEDLINE KIT)  15 mL Mouth Rinse BID  . Chlorhexidine Gluconate Cloth  6 each Topical Daily  . enoxaparin (LOVENOX) injection  40 mg Subcutaneous Q24H  . feeding supplement (PROSource TF)  45 mL Per Tube Daily  . free water  100 mL Per Tube Q4H  . hydrALAZINE  50 mg Per Tube Q8H  . insulin aspart  0-9 Units Subcutaneous Q4H  . mouth rinse  15 mL Mouth Rinse 5 X Daily  . metoprolol tartrate  125 mg Per Tube BID  . modafinil  200 mg Per Tube Daily  . pantoprazole sodium  40 mg Per Tube BID  . scopolamine  1 patch Transdermal Q72H  . sennosides  5 mL Per Tube QHS   Continuous Infusions: . sodium chloride Stopped (09/28/20 1344)  . feeding supplement (OSMOLITE 1.2 CAL) 55 mL/hr at 10/22/20 6759    Principal Problem:   ICH (intracerebral hemorrhage)  (HCC) Active Problems:   Intraparenchymal hemorrhage of brain (HCC)   Acute hypoxemic respiratory failure (HCC)   Fever   Endotracheal tube present   Status post tracheostomy ([place])   Palliative care by specialist   Muscle hypertonicity   Oropharyngeal dysphagia   Brain injury with loss of consciousness ([place])   Poor prognosis   Aspiration pneumonia of right lower lobe [facility])   Consultants:  Palliative care  Pulm  Neurology   Procedures:  Tracheostomy  PEG tube  EEG  Echocardiogram  Antibiotics: Anti-infectives (From admission, onward)   Start     Dose/Rate Route Frequency Ordered Stop   10/14/20 1815  Ampicillin-Sulbactam (UNASYN) 3 g in sodium chloride 0.9 % 100 mL IVPB        3 g 200 mL/hr over 30 Minutes Intravenous Every 6 hours 10/14/20 1754 10/19/20 1340   10/08/20 0845  ciprofloxacin (CIPRO) tablet 500 mg  Status:  Discontinued        500 mg Per Tube 2 times daily 10/08/20 0754 10/14/20 1731   09/21/20 1545  ceFAZolin (ANCEF) IVPB 1 g/50 mL premix        1 g 100 mL/hr over 30 Minutes Intravenous Every 8 hours 09/21/20 1457 09/28/20 0647   09/20/20 0600  ceFAZolin (ANCEF) IVPB 1 g/50 mL premix  Status:  Discontinued        1 g 100 mL/hr over 30 Minutes Intravenous Every 8 hours 09/14/20 0903 09/14/20 0904   09/16/20 1300  vancomycin (VANCOREADY) IVPB 1250 mg/250 mL  Status:  Discontinued        1,250 mg 166.7 mL/hr over 90 Minutes Intravenous Every 24 hours 09/15/20 1232 09/17/20 0956   09/15/20 1230  vancomycin (VANCOREADY) IVPB 1250 mg/250 mL        1,250 mg 166.7 mL/hr over 90 Minutes Intravenous  Once 09/15/20 1131 09/15/20 1402   09/13/20 1400  ceFEPIme (MAXIPIME) 2 g in sodium chloride 0.9 % 100 mL IVPB        2 g 200 mL/hr over 30 Minutes Intravenous Every 8 hours 09/13/20 1341 09/19/20 2208   09/06/20 0600  vancomycin (VANCOREADY) IVPB 1250 mg/250 mL        1,250 mg 166.7 mL/hr over 90 Minutes Intravenous  Once 09/06/20 0541 09/06/20 0739    09/05/20 1400  ceFAZolin (ANCEF) IVPB 1 g/50 mL  premix  Status:  Discontinued        1 g 100 mL/hr over 30 Minutes Intravenous Every 8 hours 09/05/20 1120 09/13/20 1341   08/30/20 0600  vancomycin (VANCOREADY) IVPB 500 mg/100 mL  Status:  Discontinued        500 mg 100 mL/hr over 60 Minutes Intravenous Every 12 hours 08/29/20 1554 09/01/20 1115   08/29/20 1700  ceFEPIme (MAXIPIME) 2 g in sodium chloride 0.9 % 100 mL IVPB  Status:  Discontinued        2 g 200 mL/hr over 30 Minutes Intravenous Every 12 hours 08/29/20 1554 09/01/20 1115   08/29/20 1645  vancomycin (VANCOREADY) IVPB 1250 mg/250 mL        1,250 mg 166.7 mL/hr over 90 Minutes Intravenous  Once 08/29/20 1554 08/29/20 1820       Time spent: 25 minutes    Erin Hearing ANP  [facility] 7 am - 330 pm/M-F for direct patient care and secure chat Please refer to Amion for contact info 56  days

## 2020-10-23 LAB — GLUCOSE, CAPILLARY
Glucose-Capillary: 106 mg/dL — ABNORMAL HIGH (ref 70–99)
Glucose-Capillary: 112 mg/dL — ABNORMAL HIGH (ref 70–99)
Glucose-Capillary: 114 mg/dL — ABNORMAL HIGH (ref 70–99)
Glucose-Capillary: 134 mg/dL — ABNORMAL HIGH (ref 70–99)
Glucose-Capillary: 168 mg/dL — ABNORMAL HIGH (ref 70–99)
Glucose-Capillary: 189 mg/dL — ABNORMAL HIGH (ref 70–99)

## 2020-10-23 LAB — URINALYSIS, ROUTINE W REFLEX MICROSCOPIC
Bilirubin Urine: NEGATIVE
Glucose, UA: NEGATIVE mg/dL
Hgb urine dipstick: NEGATIVE
Ketones, ur: NEGATIVE mg/dL
Leukocytes,Ua: NEGATIVE
Nitrite: NEGATIVE
Protein, ur: NEGATIVE mg/dL
Specific Gravity, Urine: 1.013 (ref 1.005–1.030)
pH: 6 (ref 5.0–8.0)

## 2020-10-23 MED ORDER — AMLODIPINE BESYLATE 2.5 MG PO TABS
2.5000 mg | ORAL_TABLET | Freq: Every day | ORAL | Status: DC
  Administered 2020-10-24 – 2020-10-31 (×8): 2.5 mg
  Filled 2020-10-23 (×8): qty 1

## 2020-10-23 MED ORDER — METOPROLOL TARTRATE 25 MG/10 ML ORAL SUSPENSION
100.0000 mg | Freq: Two times a day (BID) | ORAL | Status: DC
  Administered 2020-10-23 – 2021-01-19 (×176): 100 mg
  Filled 2020-10-23 (×183): qty 40

## 2020-10-23 NOTE — TOC Progression Note (Signed)
Transition of Care [facility]) - Progression Note    Patient Details  Name: Charlene [name] MRN: [id number] Date of Birth: 1965/11/13  [facility] CM/SW Contact  Janae Bridgeman, RN Phone Number: 10/23/2020, 1:57 PM  Clinical Narrative:    CM and MSW will continue to follow for transitions of care needs.  The patient is currently not medically stable for transitioning to home with home health considering patient care needs.  The family declines SNF placement and prefers home with home health when patient is medically stable for discharge.   Expected Discharge Plan: Home w Home Health Services Barriers to Discharge: Continued Medical Work up  Expected Discharge Plan and Services Expected Discharge Plan: Home w Home Health Services In-house Referral: Clinical Social Work Discharge Planning Services: CM Consult   Living arrangements for the past 2 months: Single Family Home                                       Social Determinants of Health (SDOH) Interventions    Readmission Risk Interventions Readmission Risk Prevention Plan 09/29/2020  Transportation Screening Complete  PCP or Specialist Appt within 3-5 Days Complete  HRI or Home Care Consult Complete  Social Work Consult for Recovery Care Planning/Counseling Complete  Palliative Care Screening Complete  Medication Review [employer]) Complete  Some recent data might be hidden

## 2020-10-23 NOTE — Progress Notes (Signed)
SBP has been suboptimal today and now with worsening tachycardia: SBP 92, HR120s MEWS yellow. BP meds held today. BUN as of 4/4 had increased to 23 so suspect mild volume depletion. Will increase free water from 100 cc q 4hrs to 200 cc and will give 1 Liter NS.

## 2020-10-23 NOTE — Progress Notes (Signed)
[facility] PROGRESS NOTE  Charlene [name] IWP:[id number] DOB: 11/08/1965 DOA: 08/26/2020 PCP: Patient, [name] (Inactive)    October 08, 2020     10/16/2020                        10/16/2020     10/18/2020                        10/20/2020    Status: Remains inpatient appropriate because:Altered mental status, Unsafe d/c plan, IV treatments appropriate due to intensity of illness or inability to take PO and Inpatient level of care appropriate due to severity of illness   Dispo: The patient is from: Home              Anticipated d/c is to: SNF vs brain injury-family does not wish to place patient in SNF.  If this is the only option they would like to consider taking the patient home although this may be a difficult undertaking given current requirement for trach and understandable fears about managing such complex patient in the home environment.  They have been updated as to why this patient currently is not a candidate for aggressive rehabilitative therapies like would be provided at a brain injury recovery rehab.  **3/24 Husband clarified that he has not delegated POA status to other family members.  **3/25 LCSW called patient's husband at home.  He confirmed that he is okay with daughter managing the Medicaid and disability process and has agreed to change the address to the one requested by the daughter.  Also a letter was requested by daughter to clarify this process and this has been placed in the chart. **3/31 power of attorney documentation has now been placed on the patient's chart.  Documentation stated for Dec 04, 2018              Patient currently is not medically stable to d/c.   Difficult to place patient Yes   Level of care: Progressive  Code Status: Full Family Communication:  extensive conversation via telephone with [name] [phone]) at bedside on 4/1.; [name] [phone]) is her POA and legal contact.  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 Subsequently on 2/10, she developed obtundation and respiratory arrest leading to intubation. She has remained persistently obtunded, now has a trach and a PEG. Hospital course complicated by persistent fever,Serratia pneumoniaand now pre-orbital cellulitis  As of 3/25 with adjustments and hypertonicity medications patient seems to be improving.  She is also waking up more although limited interaction but both eyes are now open and she tracks with the left eye.  Multiple discussions with husband who has confirmed he has not delegated other family members to be POA and he is the primary legal contact for patient.  He has agreed to let patient's daughter take the lead on completing and following up on Medicaid and disability applications including having these applications sent to her address.  2/09 Admit for ICH 2/10 EVD  placed, later intubated due to obtundation and respiratory arrest 2/11 MRI Brain, hypertonic saline d/c'd, EVD not working. Husband did not consent to PICC  2/13 EVD removed 2/17 [place] discussion with family, PMT 2/24 Bedside trach and PEG 3/1 Transferred to [facility]  Subjective: Awake and appears to be alert.  Has some tracking with right eye.  Please see OT note from 4/7.  Has purposeful movement of right arm with gross motor activity but unable to follow simple commands.  No attempts to vocalize.  Mother at bedside.  Objective: Vitals:   10/23/20 0432 10/23/20 0622  BP:  110/71  Pulse: 93   Resp:  (!) 23   Temp:    SpO2: 97%     Intake/Output Summary (Last 24 hours) at 10/23/2020 0742 Last data filed at 10/23/2020 0313 Gross per 24 hour  Intake 660 ml  Output 350 ml  Net 310 ml   Filed Weights   10/18/20 0500 10/18/20 1613 10/23/20 0500  Weight: 65.5 kg 67.7 kg 66.9 kg    Exam:  Constitutional: Awake, appears to be comfortable, no acute distress Respiratory: #6.0 cuffless trach, trach collar with FiO2 21% at 5L,O2 sats 97 to 99%.  Continues without spontaneous productive cough.  Does not require frequent suctioning.  Anterior lung sounds are clear to auscultation somewhat diminished in the bases.  No tachypnea. Cardiovascular: S1S2, stable tachycardia with heart rate at rest between 95 and 102 bpm.  A little bit higher while visitors and staff in the room. Abdomen:  PEG tube,  LBM 4/2 bowel sounds are present and normoactive.  Abdomen nontender. Musculoskeletal: Marked decrease in torticollis with associated left neck deviation markedly improved.  Previous hypertonicity significantly improved without any rebound spasticity with PROM Neurologic: CN 2-12 with abnormalities as follows: Blink reflex is present bilaterally although still has some medial disconjugate gaze involving the left eye. Lower extremity foot splints in place.  Able to track with right eye partially with stopping noted around mid field/midline gaze-please see OT notes from 4/7-patient has spontaneous purposeful movement that is gross motor in nature of right upper extremity Psych: Awake but nonverbal so unable to accurately assess orientation.  Bland flat affect.  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 Intraventricular penetration with blood filling the 3rd and forth ventricles and nearly  filling the right lateral ventricle.  -MRI 2/11 with intraparenchymal hemorrhage centered within the right thalamus and basal ganglia extending into the right midbrain with intraventricular extension. Effacement of prepontine cistern and similar 4 mm of leftward midline shift. Separate areas of ristricted diffusion and edema involving the right greater than left mesial temporal lobes / parahippocampal cortex, bilateral basal ganglia, and possibly the upper pons that are concerning for acute infarcts, most likely 2/2 mass effect from the hemorrhage (see report)/ Per neuro "scattered b/l hippocampus, posterior CC, bilateral thalamus, and upper pons, likely due to deep penetrating vessel compression by focal mass-effect".  -2/19 MRI with acute to early subacute infarcts in the bilateral cerebral white matter and right cerebellum, new from 2/11 - interval evolution of multiple other infarcts present on prior MRI, increased hemorrhage along old right EVD tract (see report) - 2/21 head CT with expected evolution of ICH since 2/13 - Echo with normal EF, no intracardiac source of embolism - CTA head/neck without intracranial arterial occlusion or high grade stenosis, no dissection, aneurysm,  or hemodynamically significant stenosis of the carotid or vertebral arteries - Head CT 3/9 with expected evolution of hemorrhage involving the right thalamus, basal ganglia, and adjacent white matter from prior exam with developing encephalomalacia.  Resolved previous intraventricular hemorrhage.  Persistent sulcal effacement with mild improvement.  Persistent 3 mm left to right midline shift.  Multiple areas of low attenuation consistent with infarcts within the central white matter, unchanged in distribution from prior.  (see report) - Per neurology- see last follow up note from 2/25 - Tea likely 2/2 hypertension and nonadherence to meds  - s/p trach/peg on 2/24  - no antithrombotic 2/2 ICH -Obstructive hydrocephalus ->  s/p external ventricular catheter placement on 08/27/20 by neurosurgery - EVD clamped on 2/12 and d/c'd on 2/13 -EEG suggestive of cortical dysfunction in right hemisphere likely 2/2 underlying bleed as well as severe diffuse encephalopathy, nonspecific etiology . -Continue baclofen 5 mg at 8 AM and 4 PM and 7.5 mg at bedtime.  LFTs remain stable with baseline subtle elevation without change since initiation of baclofen. -Continue Provigil 200 mg daily as recommended by PMR team -3/22 EEG no seizures and persistent encephalopathy -Prognosis remains guarded and it is highly doubtful she will return to previous level of functioning therefore financial counseling notified to pursue disability application -Add scheduled Senokot at HS given requirement for [facility] -Per PT and OT notes dated 4/5 patient not able to participate with therapies.  Likely will sign off soon but can be reconsulted if patient is noted with improvement -Begin out of bed to chair daily and PROM every 4 hours per nursing staff  Recurrent low-grade fever -Likely secondary to combination of atelectasis and subtle aspiration noting these fevers particularly occur in the middle of the night -WBC up at 11.3 -No increased O2 needs -Check urinalysis and urine culture -as of 4/8 pending collection  Chronic pansinusitis/recurrent fevers 2/2 aspiration PNA;History of Serratia Pneumonia  Hospital Acquired Pneumonia:  -Tracheal aspirate from 2/25 and 3/1 with serratia - cefepime 2/27-3/5; repeat sputum cx 3/21 + serratia (likely colonization but w/ recurrent fever opted to treat) -Has completed antibiotics recently for bilateral orbital cellulitis -started on Cipro for tracheal aspirate positive for Serratia marcescens   -Repeat sinus CT demonstrates progressive complete bilateral maxillary sinus opacification with unchanged complete left sphenoid sinus opacification and right sphenoid sinus fluid c/w chronic state  -CT Chest 3/30 with  aspiration PNA-dtr updated at bedside by attending MD- Cipro changed to Unasyn IV-Unasyn now completed -no increased O2 needs  Neck Contracture/torticollis -Preference for keeping head turned to the left. -Continue baclofen as above -- continue PT/OT weekly per their recommendations given patient's inability to effectively participate at this juncture.  Nursing staff performing PROM activities.  Family also instructed and has been performing intermittently. PT eval 4/5   Pt received supine in bed, eyes open. Maximove used to transfer pt bed to recliner. Pt tolerated this well with stable HR, O2 sats, and BP but did not engage with this any more than she has when working in supine or seated EOB. Mother present and education given on positioning in the seated position. Pt not demonstrating volitional mvmt or following any commands. We are reaching the point where pt will not longer be appropriate for acute PT due to her status not changing with stimulation and family training on ROM and positioning being close to done. New goals set for next 2 weeks but expect to sign off at that point.    OT eval 4/7:  Patient supine in bed, session focus on distinguishing patients visual input and command following.  In a quiet environment with no distractions, patient appears to track R eye 1/3 of the way to midline (up to 50% by end of session) towards L when given min to mod cueing and fixating on therapist approx 50% of the time (3/6 trials) for 1 second.  Also, given 1 step simple command, mod cueing (using her name before cue) and up to 3 minute delay (with R hand in optimal position- wrist supported) pt able to move R thumb. With seeing what appears to be minimal visual tracking, fixating, and thumb movement volitionally (although it is still hard to determine vs spasticity at times), OT will give patient a trial of 2x/week visits to focus on and strengthen these skills in hopes to allow to progress towards  basic communication of distinguishing between 2 items or pictures (yes/no) or strengthening "thumbs up". Plan to occlude L eye with nasal occlusion glasses to strengthen R eye. Also discussed with RN option of moving patient to opposite side of hallway to provide more stimulation/input towards L side for encouragement of patient to scan towards midline with R eye. Updated goals.      -OT would also like to have patient placed in a different room across the hallway which would allow for optimal visualization using right eye based on positioning in bed and said room.  Hypertensive emergency/Malignant HTN Resolved -Home medications include Coreg and HCTZ which have not been reordered -On 4/7 BP was trending upward therefore Norvasc increased from 5 mg to 10 mg.  As of 4/8 BP now softer/suboptimal therefore have opted to hold doses today, decrease Norvasc 2.5 mg and decrease metoprolol from 125 mg to 100 mg twice daily and follow trends. -Continue to treat underlying causes such as pain.  Acute hypoxemic respiratory failure secondary to CVA requiring mechanical ventilation and tracheostomy tube -Stable on trach collar and has been transitioned to a #6 cuffless trach -Has significant altered mentation and inability to manage oral secretions at present time is not a safe candidate for decannulation.  Discussed this with family as major obstacle in discharging to home.  This will also decrease options for SNF placement. -Appreciate trach team assistance in management -See above regarding evaluation for recurrent low-grade fevers.  Pain likely related to underlying hypertonicity -Morphine IV, oxycodone and Tylenol prn (watch elev LFTs) -Treat underlying hypertonicity with skeletal muscle relaxers and uptitrate as tolerated with goal to improve and resolve hypertonicity without contributing to oversedation  Dysphagia/Nutrition Status: Nutrition Problem: Inadequate oral intake Etiology: inability to  eat Signs/Symptoms: NPO status Interventions: Tube feeding consisting of Osmolite 1.2 Cal at 55 cc/h; continue Prosource liquid 45 cc daily, free water 100 cc every 4 hours per tube; will also continue to follow CBGs regularly while on tube feedings and provide SSI Estimated body mass index is 24.54 kg/m as calculated from the following:   Height as of this encounter: $RemoveBeforeD'5\' 5"'[id number]$  (1.651 m).   Weight as of this encounter: 66.9 kg. If patient eventually discharged to home to teach family how to bolus feed as opposed to utilizing continuous tube feeding infusions.   Other problems: Anasarca -bilateral upper extremity edema, minimal LE edema - suspect related to prolonged hospitalization, hypoalbuminemia -upper extremity US-> age indeterminate SVT of right cephalic vein  Elevated LFT's - Remain elevated but stable without any obstructive pattern -Continue baclofen and statin  Bacterial Conjunctivitis/periorbital cellulitis - MRI 3/7 orbits with L periorbital cellulitis  --  CT head 3/9 with persistent subtotal opacification of paranasal sinuses and mastoid air cells, bubbly lucency at base of R maxillary sinus (likely odontogenic in origin) - CT maxillofacial 3/12 - improving cellulitis  - s/p erythromycin ointment - completed course of Ancef   Anemia -Stable, follow  Sinus Tachycardia Better on BB and after treatment of pain/hypertonicity Echo with EF 60-65% (see report)   Data Reviewed: Basic Metabolic Panel: Recent Labs  Lab 10/17/20 0328 10/19/20 0510  NA 137 140  K 4.2 4.1  CL 103 102  CO2 25 27  GLUCOSE 122* 136*  BUN 17 20  CREATININE 0.65 0.69  CALCIUM 9.3 9.3   Liver Function Tests: Recent Labs  Lab 10/17/20 0328 10/19/20 0510  AST 58* 45*  ALT 101* 78*  ALKPHOS 102 93  BILITOT 0.5 0.4  PROT 7.5 7.1  ALBUMIN 2.8* 2.6*   No results for input(s): LIPASE, AMYLASE in the last 168 hours. No results for input(s): AMMONIA in the last 168 hours. CBC: Recent  Labs  Lab 10/17/20 0328 10/22/20 1139  WBC 8.5 11.3*  NEUTROABS 4.5  --   HGB 10.3* 9.8*  HCT 32.3* 31.2*  MCV 82.6 82.1  PLT 346 390   Cardiac Enzymes: No results for input(s): CKTOTAL, CKMB, CKMBINDEX, TROPONINI in the last 168 hours. BNP (last 3 results) No results for input(s): BNP in the last 8760 hours.  ProBNP (last 3 results) No results for input(s): PROBNP in the last 8760 hours.  CBG: Recent Labs  Lab 10/22/20 1155 10/22/20 1604 10/22/20 2041 10/22/20 2327 10/23/20 0303  GLUCAP 135* 157* 144* 193* 168*    No results found for this or any previous visit (from the past 240 hour(s)).   Studies: No results found.  Scheduled Meds: . amLODipine  10 mg Per Tube Daily  . atorvastatin  40 mg Per Tube Daily  . baclofen  5 mg Per Tube 2 times per day  . baclofen  7.5 mg Per Tube Daily  . chlorhexidine gluconate (MEDLINE KIT)  15 mL Mouth Rinse BID  . Chlorhexidine Gluconate Cloth  6 each Topical Daily  . enoxaparin (LOVENOX) injection  40 mg Subcutaneous Q24H  . feeding supplement (PROSource TF)  45 mL Per Tube Daily  . free water  100 mL Per Tube Q4H  . hydrALAZINE  50 mg Per Tube Q8H  . insulin aspart  0-9 Units Subcutaneous Q4H  . mouth rinse  15 mL Mouth Rinse 5 X Daily  . metoprolol tartrate  125 mg Per Tube BID  . modafinil  200 mg Per Tube Daily  . pantoprazole sodium  40 mg Per Tube BID  . scopolamine  1 patch Transdermal Q72H  . sennosides  5 mL Per Tube QHS   Continuous Infusions: . sodium chloride Stopped (09/28/20 1344)  . feeding supplement (OSMOLITE 1.2 CAL) 1,000 mL (10/23/20 0524)    Principal Problem:   ICH (intracerebral hemorrhage) ([place]) Active Problems:   Intraparenchymal hemorrhage of brain (HCC)   Acute hypoxemic respiratory failure (HCC)   Fever   Endotracheal tube present   Status post tracheostomy ([place])   Palliative care by specialist   Muscle hypertonicity   Oropharyngeal dysphagia   Brain injury with loss of consciousness  ([place])   Poor prognosis   Aspiration pneumonia of right lower lobe [facility])   Consultants:  Palliative care  Pulm  Neurology   Procedures:  Tracheostomy  PEG tube  EEG  Echocardiogram  Antibiotics: Anti-infectives (From admission, onward)  Start     Dose/Rate Route Frequency Ordered Stop   10/14/20 1815  Ampicillin-Sulbactam (UNASYN) 3 g in sodium chloride 0.9 % 100 mL IVPB        3 g 200 mL/hr over 30 Minutes Intravenous Every 6 hours 10/14/20 1754 10/19/20 1340   10/08/20 0845  ciprofloxacin (CIPRO) tablet 500 mg  Status:  Discontinued        500 mg Per Tube 2 times daily 10/08/20 0754 10/14/20 1731   09/21/20 1545  ceFAZolin (ANCEF) IVPB 1 g/50 mL premix        1 g 100 mL/hr over 30 Minutes Intravenous Every 8 hours 09/21/20 1457 09/28/20 0647   09/20/20 0600  ceFAZolin (ANCEF) IVPB 1 g/50 mL premix  Status:  Discontinued        1 g 100 mL/hr over 30 Minutes Intravenous Every 8 hours 09/14/20 0903 09/14/20 0904   09/16/20 1300  vancomycin (VANCOREADY) IVPB 1250 mg/250 mL  Status:  Discontinued        1,250 mg 166.7 mL/hr over 90 Minutes Intravenous Every 24 hours 09/15/20 1232 09/17/20 0956   09/15/20 1230  vancomycin (VANCOREADY) IVPB 1250 mg/250 mL        1,250 mg 166.7 mL/hr over 90 Minutes Intravenous  Once 09/15/20 1131 09/15/20 1402   09/13/20 1400  ceFEPIme (MAXIPIME) 2 g in sodium chloride 0.9 % 100 mL IVPB        2 g 200 mL/hr over 30 Minutes Intravenous Every 8 hours 09/13/20 1341 09/19/20 2208   09/06/20 0600  vancomycin (VANCOREADY) IVPB 1250 mg/250 mL        1,250 mg 166.7 mL/hr over 90 Minutes Intravenous  Once 09/06/20 0541 09/06/20 0739   09/05/20 1400  ceFAZolin (ANCEF) IVPB 1 g/50 mL premix  Status:  Discontinued        1 g 100 mL/hr over 30 Minutes Intravenous Every 8 hours 09/05/20 1120 09/13/20 1341   08/30/20 0600  vancomycin (VANCOREADY) IVPB 500 mg/100 mL  Status:  Discontinued        500 mg 100 mL/hr over 60 Minutes Intravenous Every 12  hours 08/29/20 1554 09/01/20 1115   08/29/20 1700  ceFEPIme (MAXIPIME) 2 g in sodium chloride 0.9 % 100 mL IVPB  Status:  Discontinued        2 g 200 mL/hr over 30 Minutes Intravenous Every 12 hours 08/29/20 1554 09/01/20 1115   08/29/20 1645  vancomycin (VANCOREADY) IVPB 1250 mg/250 mL        1,250 mg 166.7 mL/hr over 90 Minutes Intravenous  Once 08/29/20 1554 08/29/20 1820       Time spent: 25 minutes    Erin Hearing ANP  [facility] 7 am - 330 pm/M-F for direct patient care and secure chat Please refer to Amion for contact info 57  days

## 2020-10-24 LAB — CBC
HCT: 26.4 % — ABNORMAL LOW (ref 36.0–46.0)
Hemoglobin: 8.4 g/dL — ABNORMAL LOW (ref 12.0–15.0)
MCH: 26.3 pg (ref 26.0–34.0)
MCHC: 31.8 g/dL (ref 30.0–36.0)
MCV: 82.5 fL (ref 80.0–100.0)
Platelets: 351 10*3/uL (ref 150–400)
RBC: 3.2 MIL/uL — ABNORMAL LOW (ref 3.87–5.11)
RDW: 16.1 % — ABNORMAL HIGH (ref 11.5–15.5)
WBC: 7.2 10*3/uL (ref 4.0–10.5)
nRBC: 0 % (ref 0.0–0.2)

## 2020-10-24 LAB — GLUCOSE, CAPILLARY
Glucose-Capillary: 140 mg/dL — ABNORMAL HIGH (ref 70–99)
Glucose-Capillary: 133 mg/dL — ABNORMAL HIGH (ref 70–99)
Glucose-Capillary: 124 mg/dL — ABNORMAL HIGH (ref 70–99)
Glucose-Capillary: 131 mg/dL — ABNORMAL HIGH (ref 70–99)
Glucose-Capillary: 132 mg/dL — ABNORMAL HIGH (ref 70–99)
Glucose-Capillary: 153 mg/dL — ABNORMAL HIGH (ref 70–99)

## 2020-10-24 LAB — BASIC METABOLIC PANEL
Anion gap: 8 (ref 5–15)
BUN: 19 mg/dL (ref 6–20)
CO2: 28 mmol/L (ref 22–32)
Calcium: 8.8 mg/dL — ABNORMAL LOW (ref 8.9–10.3)
Chloride: 104 mmol/L (ref 98–111)
Creatinine, Ser: 0.6 mg/dL (ref 0.44–1.00)
GFR, Estimated: >60 mL/min (ref >60)
Glucose, Bld: 137 mg/dL — ABNORMAL HIGH (ref 70–99)
Potassium: 4.1 mmol/L (ref 3.5–5.1)
Sodium: 140 mmol/L (ref 135–145)

## 2020-10-24 NOTE — Plan of Care (Signed)
  Problem: Education: Goal: Knowledge of disease or condition will improve Outcome: Not Progressing Goal: Knowledge of secondary prevention will improve Outcome: Not Progressing Goal: Knowledge of patient specific risk factors addressed and post discharge goals established will improve Outcome: Not Progressing   Problem: Coping: Goal: Will verbalize positive feelings about self Outcome: Not Progressing Goal: Will identify appropriate support needs Outcome: Not Progressing   Problem: Health Behavior/Discharge Planning: Goal: Ability to manage health-related needs will improve Outcome: Not Progressing   Problem: Self-Care: Goal: Ability to participate in self-care as condition permits will improve Outcome: Not Progressing Goal: Verbalization of feelings and concerns over difficulty with self-care will improve Outcome: Not Progressing Goal: Ability to communicate needs accurately will improve Outcome: Not Progressing   Problem: Nutrition: Goal: Risk of aspiration will decrease Outcome: Progressing Goal: Dietary intake will improve Outcome: Not Progressing   Problem: Intracerebral Hemorrhage Tissue Perfusion: Goal: Complications of Intracerebral Hemorrhage will be minimized Outcome: Progressing   Problem: Education: Goal: Knowledge of General Education information will improve Description: Including pain rating scale, medication(s)/side effects and non-pharmacologic comfort measures Outcome: Not Progressing

## 2020-10-24 NOTE — Progress Notes (Signed)
[facility] PROGRESS NOTE  Charlene [name] IWP:[id number] DOB: 11/08/1965 DOA: 08/26/2020 PCP: Patient, [name] (Inactive)    October 08, 2020     10/16/2020                        10/16/2020     10/18/2020                        10/20/2020    Status: Remains inpatient appropriate because:Altered mental status, Unsafe d/c plan, IV treatments appropriate due to intensity of illness or inability to take PO and Inpatient level of care appropriate due to severity of illness   Dispo: The patient is from: Home              Anticipated d/c is to: SNF vs brain injury-family does not wish to place patient in SNF.  If this is the only option they would like to consider taking the patient home although this may be a difficult undertaking given current requirement for trach and understandable fears about managing such complex patient in the home environment.  They have been updated as to why this patient currently is not a candidate for aggressive rehabilitative therapies like would be provided at a brain injury recovery rehab.  **3/24 Husband clarified that he has not delegated POA status to other family members.  **3/25 LCSW called patient's husband at home.  He confirmed that he is okay with daughter managing the Medicaid and disability process and has agreed to change the address to the one requested by the daughter.  Also a letter was requested by daughter to clarify this process and this has been placed in the chart. **3/31 power of attorney documentation has now been placed on the patient's chart.  Documentation stated for Dec 04, 2018              Patient currently is not medically stable to d/c.   Difficult to place patient Yes   Level of care: Progressive  Code Status: Full Family Communication:  extensive conversation via telephone with [name] [phone]) at bedside on 4/1.; [name] [phone]) is her POA and legal contact.  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 Subsequently on 2/10, she developed obtundation and respiratory arrest leading to intubation. She has remained persistently obtunded, now has a trach and a PEG. Hospital course complicated by persistent fever,Serratia pneumoniaand now pre-orbital cellulitis  As of 3/25 with adjustments and hypertonicity medications patient seems to be improving.  She is also waking up more although limited interaction but both eyes are now open and she tracks with the left eye.  Multiple discussions with husband who has confirmed he has not delegated other family members to be POA and he is the primary legal contact for patient.  He has agreed to let patient's daughter take the lead on completing and following up on Medicaid and disability applications including having these applications sent to her address.  2/09 Admit for ICH 2/10 EVD  placed, later intubated due to obtundation and respiratory arrest 2/11 MRI Brain, hypertonic saline d/c'd, EVD not working. Husband did not consent to PICC  2/13 EVD removed 2/17 [place] discussion with family, PMT 2/24 Bedside trach and PEG 3/1 Transferred to [facility]  Subjective: Seen and examined with her husband at bedside.  Somnolent but easily arousable.  No new complaints.  Heart rate improved this morning.  Objective: Vitals:   10/24/20 1550 10/24/20 1714  BP: 121/71 (!) 143/86  Pulse:    Resp: 14   Temp: 99.3 F (37.4 C)   SpO2:      Intake/Output Summary (Last 24 hours) at 10/24/2020  1811 Last data filed at 10/24/2020 1603 Gross per 24 hour  Intake --  Output 2000 ml  Net -2000 ml   Filed Weights   10/18/20 1613 10/23/20 0500 10/24/20 0342  Weight: 67.7 kg 66.9 kg 68.3 kg    Exam: No significant changes from prior exam.  Constitutional: Awake, appears to be comfortable, no acute distress Respiratory: #6.0 cuffless trach, trach collar with FiO2 21% at 5L,O2 sats 97 to 99%.  Continues without spontaneous productive cough.  Does not require frequent suctioning.  Anterior lung sounds are clear to auscultation somewhat diminished in the bases.  No tachypnea. Cardiovascular: S1S2, stable tachycardia with heart rate at rest between 95 and 102 bpm.  A little bit higher while visitors and staff in the room. Abdomen:  PEG tube,  LBM 4/2 bowel sounds are present and normoactive.  Abdomen nontender. Musculoskeletal: Marked decrease in torticollis with associated left neck deviation markedly improved.  Previous hypertonicity significantly improved without any rebound spasticity with PROM Neurologic: CN 2-12 with abnormalities as follows: Blink reflex is present bilaterally although still has some medial disconjugate gaze involving the left eye. Lower extremity foot splints in place.  Able to track with right eye partially with stopping noted around mid field/midline gaze-please see OT notes from 4/7-patient has spontaneous purposeful movement that is gross motor in nature of right upper extremity Psych: Awake but nonverbal so unable to accurately assess orientation.  Bland flat affect.  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 Intraventricular penetration with blood filling the 3rd and forth ventricles and nearly filling the right lateral ventricle.  -MRI 2/11 with  intraparenchymal hemorrhage centered within the right thalamus and basal ganglia extending into the right midbrain with intraventricular extension. Effacement of prepontine cistern and similar 4 mm of leftward midline shift. Separate areas of ristricted diffusion and edema involving the right greater than left mesial temporal lobes / parahippocampal cortex, bilateral basal ganglia, and possibly the upper pons that are concerning for acute infarcts, most likely 2/2 mass effect from the hemorrhage (see report)/ Per neuro "scattered b/l hippocampus, posterior CC, bilateral thalamus, and upper pons, likely due to deep penetrating vessel compression by focal mass-effect".  -2/19 MRI with acute to early subacute infarcts in the bilateral cerebral white matter and right cerebellum, new from 2/11 - interval evolution of multiple other infarcts present on prior MRI, increased hemorrhage along old right EVD tract (see report) - 2/21 head CT with expected evolution of ICH since 2/13 - Echo with normal EF, no intracardiac source of embolism - CTA head/neck without intracranial arterial occlusion or high grade stenosis, no dissection, aneurysm, or hemodynamically significant stenosis of the carotid or vertebral arteries - Head CT 3/9 with  expected evolution of hemorrhage involving the right thalamus, basal ganglia, and adjacent white matter from prior exam with developing encephalomalacia.  Resolved previous intraventricular hemorrhage.  Persistent sulcal effacement with mild improvement.  Persistent 3 mm left to right midline shift.  Multiple areas of low attenuation consistent with infarcts within the central white matter, unchanged in distribution from prior.  (see report) - Per neurology- see last follow up note from 2/25 - [place] likely 2/2 hypertension and nonadherence to meds  - s/p trach/peg on 2/24  - no antithrombotic 2/2 ICH -Obstructive hydrocephalus -> s/p external ventricular catheter placement on 08/27/20  by neurosurgery - EVD clamped on 2/12 and d/c'd on 2/13 -EEG suggestive of cortical dysfunction in right hemisphere likely 2/2 underlying bleed as well as severe diffuse encephalopathy, nonspecific etiology . -Continue baclofen 5 mg at 8 AM and 4 PM and 7.5 mg at bedtime.  LFTs remain stable with baseline subtle elevation without change since initiation of baclofen. -Continue Provigil 200 mg daily as recommended by PMR team -3/22 EEG no seizures and persistent encephalopathy -Prognosis remains guarded and it is highly doubtful she will return to previous level of functioning therefore financial counseling notified to pursue disability application -Add scheduled Senokot at HS given requirement for [facility] -Per PT and OT notes dated 4/5 patient not able to participate with therapies.  Likely will sign off soon but can be reconsulted if patient is noted with improvement -Begin out of bed to chair daily and PROM every 4 hours per nursing staff  Recurrent low-grade fever -Likely secondary to combination of atelectasis and subtle aspiration noting these fevers particularly occur in the middle of the night -WBC up at 11.3 -No increased O2 needs -Check urinalysis and urine culture -as of 4/8 pending collection  Chronic pansinusitis/recurrent fevers 2/2 aspiration PNA;History of Serratia Pneumonia  Hospital Acquired Pneumonia:  -Tracheal aspirate from 2/25 and 3/1 with serratia - cefepime 2/27-3/5; repeat sputum cx 3/21 + serratia (likely colonization but w/ recurrent fever opted to treat) -Has completed antibiotics recently for bilateral orbital cellulitis -started on Cipro for tracheal aspirate positive for Serratia marcescens   -Repeat sinus CT demonstrates progressive complete bilateral maxillary sinus opacification with unchanged complete left sphenoid sinus opacification and right sphenoid sinus fluid c/w chronic state  -CT Chest 3/30 with aspiration PNA-dtr updated at bedside by attending MD- Cipro  changed to Unasyn IV-Unasyn now completed -no increased O2 needs  Neck Contracture/torticollis -Preference for keeping head turned to the left. -Continue baclofen as above -- continue PT/OT weekly per their recommendations given patient's inability to effectively participate at this juncture.  Nursing staff performing PROM activities.  Family also instructed and has been performing intermittently. PT eval 4/5   Pt received supine in bed, eyes open. Maximove used to transfer pt bed to recliner. Pt tolerated this well with stable HR, O2 sats, and BP but did not engage with this any more than she has when working in supine or seated EOB. Mother present and education given on positioning in the seated position. Pt not demonstrating volitional mvmt or following any commands. We are reaching the point where pt will not longer be appropriate for acute PT due to her status not changing with stimulation and family training on ROM and positioning being close to done. New goals set for next 2 weeks but expect to sign off at that point.    OT eval 4/7:   Patient supine in bed, session focus on distinguishing patients visual input and command following.  In a quiet environment with no distractions, patient appears to track R eye 1/3 of the way to midline (up to 50% by end of session) towards L when given min to mod cueing and fixating on therapist approx 50% of the time (3/6 trials) for 1 second.  Also, given 1 step simple command, mod cueing (using her name before cue) and up to 3 minute delay (with R hand in optimal position- wrist supported) pt able to move R thumb. With seeing what appears to be minimal visual tracking, fixating, and thumb movement volitionally (although it is still hard to determine vs spasticity at times), OT will give patient a trial of 2x/week visits to focus on and strengthen these skills in hopes to allow to progress towards basic communication of distinguishing between 2 items or pictures  (yes/no) or strengthening "thumbs up". Plan to occlude L eye with nasal occlusion glasses to strengthen R eye. Also discussed with RN option of moving patient to opposite side of hallway to provide more stimulation/input towards L side for encouragement of patient to scan towards midline with R eye. Updated goals.      -OT would also like to have patient placed in a different room across the hallway which would allow for optimal visualization using right eye based on positioning in bed and said room.  Hypertensive emergency/Malignant HTN Resolved -Home medications include Coreg and HCTZ which have not been reordered -On 4/7 BP was trending upward therefore Norvasc increased from 5 mg to 10 mg.  As of 4/8 BP now softer/suboptimal therefore have opted to hold doses today, decrease Norvasc 2.5 mg and decrease metoprolol from 125 mg to 100 mg twice daily and follow trends. -Continue to treat underlying causes such as pain.  Acute hypoxemic respiratory failure secondary to CVA requiring mechanical ventilation and tracheostomy tube -Stable on trach collar and has been transitioned to a #6 cuffless trach -Has significant altered mentation and inability to manage oral secretions at present time is not a safe candidate for decannulation.  Discussed this with family as major obstacle in discharging to home.  This will also decrease options for SNF placement. -Appreciate trach team assistance in management -See above regarding evaluation for recurrent low-grade fevers.  Pain likely related to underlying hypertonicity -Morphine IV, oxycodone and Tylenol prn (watch elev LFTs) -Treat underlying hypertonicity with skeletal muscle relaxers and uptitrate as tolerated with goal to improve and resolve hypertonicity without contributing to oversedation  Dysphagia/Nutrition Status: Nutrition Problem: Inadequate oral intake Etiology: inability to eat Signs/Symptoms: NPO status Interventions: Tube feeding  consisting of Osmolite 1.2 Cal at 55 cc/h; continue Prosource liquid 45 cc daily, free water 100 cc every 4 hours per tube; will also continue to follow CBGs regularly while on tube feedings and provide SSI Estimated body mass index is 25.06 kg/m as calculated from the following:   Height as of this encounter: _0  (1.651 m).   Weight as of this encounter: 68.3 kg. If patient eventually discharged to home to teach family how to bolus feed as opposed to utilizing continuous tube feeding infusions.   Other problems: Anasarca -bilateral upper extremity edema, minimal LE edema - suspect related to prolonged hospitalization, hypoalbuminemia -upper extremity US-> age indeterminate SVT of right cephalic vein  Elevated LFT's - Remain elevated but stable without any obstructive pattern -Continue baclofen and statin  Bacterial Conjunctivitis/periorbital cellulitis - MRI 3/7 orbits with L periorbital cellulitis  -- CT head 3/9 with persistent subtotal opacification of paranasal sinuses and mastoid air  cells, bubbly lucency at base of R maxillary sinus (likely odontogenic in origin) - CT maxillofacial 3/12 - improving cellulitis  - s/p erythromycin ointment - completed course of Ancef   Anemia -Stable, follow  Sinus Tachycardia Better on BB and after treatment of pain/hypertonicity Echo with EF 60-65% (see report)   Data Reviewed: Basic Metabolic Panel: Recent Labs  Lab 10/19/20 0510 10/24/20 0329  NA 140 140  K 4.1 4.1  CL 102 104  CO2 27 28  GLUCOSE 136* 137*  BUN 20 19  CREATININE 0.69 0.60  CALCIUM 9.3 8.8*   Liver Function Tests: Recent Labs  Lab 10/19/20 0510  AST 45*  ALT 78*  ALKPHOS 93  BILITOT 0.4  PROT 7.1  ALBUMIN 2.6*   No results for input(s): LIPASE, AMYLASE in the last 168 hours. No results for input(s): AMMONIA in the last 168 hours. CBC: Recent Labs  Lab 10/22/20 1139 10/24/20 0329  WBC 11.3* 7.2  HGB 9.8* 8.4*  HCT 31.2* 26.4*  MCV 82.1  82.5  PLT 390 351   Cardiac Enzymes: No results for input(s): CKTOTAL, CKMB, CKMBINDEX, TROPONINI in the last 168 hours. BNP (last 3 results) No results for input(s): BNP in the last 8760 hours.  ProBNP (last 3 results) No results for input(s): PROBNP in the last 8760 hours.  CBG: Recent Labs  Lab 10/23/20 2349 10/24/20 0341 10/24/20 0743 10/24/20 1121 10/24/20 1549  GLUCAP 189* 140* 133* 124* 131*    No results found for this or any previous visit (from the past 240 hour(s)).   Studies: No results found.  Scheduled Meds: . amLODipine  2.5 mg Per Tube Daily  . atorvastatin  40 mg Per Tube Daily  . baclofen  5 mg Per Tube 2 times per day  . baclofen  7.5 mg Per Tube Daily  . chlorhexidine gluconate (MEDLINE KIT)  15 mL Mouth Rinse BID  . Chlorhexidine Gluconate Cloth  6 each Topical Daily  . enoxaparin (LOVENOX) injection  40 mg Subcutaneous Q24H  . feeding supplement (PROSource TF)  45 mL Per Tube Daily  . free water  100 mL Per Tube Q4H  . hydrALAZINE  50 mg Per Tube Q8H  . insulin aspart  0-9 Units Subcutaneous Q4H  . mouth rinse  15 mL Mouth Rinse 5 X Daily  . metoprolol tartrate  100 mg Per Tube BID  . modafinil  200 mg Per Tube Daily  . pantoprazole sodium  40 mg Per Tube BID  . scopolamine  1 patch Transdermal Q72H  . sennosides  5 mL Per Tube QHS   Continuous Infusions: . sodium chloride Stopped (09/28/20 1344)  . feeding supplement (OSMOLITE 1.2 CAL) 1,000 mL (10/23/20 0524)    Principal Problem:   ICH (intracerebral hemorrhage) (HCC) Active Problems:   Intraparenchymal hemorrhage of brain (HCC)   Acute hypoxemic respiratory failure (HCC)   Fever   Endotracheal tube present   Status post tracheostomy ([place])   Palliative care by specialist   Muscle hypertonicity   Oropharyngeal dysphagia   Brain injury with loss of consciousness ([place])   Poor prognosis   Aspiration pneumonia of right lower lobe [facility])   Consultants:  Palliative  care  Pulm  Neurology   Procedures:  Tracheostomy  PEG tube  EEG  Echocardiogram  Antibiotics: Anti-infectives (From admission, onward)   Start     Dose/Rate Route Frequency Ordered Stop   10/14/20 1815  Ampicillin-Sulbactam (UNASYN) 3 g in sodium chloride 0.9 % 100 mL IVPB  3 g 200 mL/hr over 30 Minutes Intravenous Every 6 hours 10/14/20 1754 10/19/20 1340   10/08/20 0845  ciprofloxacin (CIPRO) tablet 500 mg  Status:  Discontinued        500 mg Per Tube 2 times daily 10/08/20 0754 10/14/20 1731   09/21/20 1545  ceFAZolin (ANCEF) IVPB 1 g/50 mL premix        1 g 100 mL/hr over 30 Minutes Intravenous Every 8 hours 09/21/20 1457 09/28/20 0647   09/20/20 0600  ceFAZolin (ANCEF) IVPB 1 g/50 mL premix  Status:  Discontinued        1 g 100 mL/hr over 30 Minutes Intravenous Every 8 hours 09/14/20 0903 09/14/20 0904   09/16/20 1300  vancomycin (VANCOREADY) IVPB 1250 mg/250 mL  Status:  Discontinued        1,250 mg 166.7 mL/hr over 90 Minutes Intravenous Every 24 hours 09/15/20 1232 09/17/20 0956   09/15/20 1230  vancomycin (VANCOREADY) IVPB 1250 mg/250 mL        1,250 mg 166.7 mL/hr over 90 Minutes Intravenous  Once 09/15/20 1131 09/15/20 1402   09/13/20 1400  ceFEPIme (MAXIPIME) 2 g in sodium chloride 0.9 % 100 mL IVPB        2 g 200 mL/hr over 30 Minutes Intravenous Every 8 hours 09/13/20 1341 09/19/20 2208   09/06/20 0600  vancomycin (VANCOREADY) IVPB 1250 mg/250 mL        1,250 mg 166.7 mL/hr over 90 Minutes Intravenous  Once 09/06/20 0541 09/06/20 0739   09/05/20 1400  ceFAZolin (ANCEF) IVPB 1 g/50 mL premix  Status:  Discontinued        1 g 100 mL/hr over 30 Minutes Intravenous Every 8 hours 09/05/20 1120 09/13/20 1341   08/30/20 0600  vancomycin (VANCOREADY) IVPB 500 mg/100 mL  Status:  Discontinued        500 mg 100 mL/hr over 60 Minutes Intravenous Every 12 hours 08/29/20 1554 09/01/20 1115   08/29/20 1700  ceFEPIme (MAXIPIME) 2 g in sodium chloride 0.9 % 100  mL IVPB  Status:  Discontinued        2 g 200 mL/hr over 30 Minutes Intravenous Every 12 hours 08/29/20 1554 09/01/20 1115   08/29/20 1645  vancomycin (VANCOREADY) IVPB 1250 mg/250 mL        1,250 mg 166.7 mL/hr over 90 Minutes Intravenous  Once 08/29/20 1554 08/29/20 1820       Time spent: 25 minutes    Kayleen Memos ANP  [facility] 7 am - 330 pm/M-F for direct patient care and secure chat Please refer to Amion for contact info 58  days

## 2020-10-25 LAB — GLUCOSE, CAPILLARY
Glucose-Capillary: 105 mg/dL — ABNORMAL HIGH (ref 70–99)
Glucose-Capillary: 150 mg/dL — ABNORMAL HIGH (ref 70–99)
Glucose-Capillary: 200 mg/dL — ABNORMAL HIGH (ref 70–99)
Glucose-Capillary: 114 mg/dL — ABNORMAL HIGH (ref 70–99)
Glucose-Capillary: 98 mg/dL (ref 70–99)
Glucose-Capillary: 138 mg/dL — ABNORMAL HIGH (ref 70–99)

## 2020-10-25 NOTE — Progress Notes (Signed)
Nutrition Follow-up  DOCUMENTATION CODES:  Not applicable  INTERVENTION:  Continue Tube feeding via PEG tube: Osmolite 1.2 at 55 ml/h (1320 ml per day) 45 ml ProSource TF daily  Provides 1624 kcal, 84 gm protein, 1070 ml free water daily  100 ml free water every 4 hours    NUTRITION DIAGNOSIS:  Inadequate oral intake related to inability to eat as evidenced by NPO status. -- ongoing  GOAL:  Patient will meet greater than or equal to 90% of their needs -- Met with TF  MONITOR:  TF tolerance,Vent status  REASON FOR ASSESSMENT:  Consult,Ventilator Enteral/tube feeding initiation and management  ASSESSMENT:  Pt with PMH of HTN with medication noncompliance admitted with hypertensive thalamic ICH.  2/10 s/p EVD placement and intubation for respiratory arrest 2/13 EVD removed 2/17 palliative care consult; family requesting tx to [facility]  2/24 s/p trach and PEG  3/1 tx to [facility] 3/11 trach changed to shiley #6 cuffless  Pt with no significant changes since last RD assessment. Per CCM, pt still not a candidate for decannulation. Pt continues to receive TF via PEG. Current regimen: Osmolite 1.2 at 55 ml/h w/ 45 ml ProSource TF daily, 172m free water flushes Q4H. Tolerating well per RN.   UOP: [id number] hours   Admit wt: 61.2 kg Current wt: 68.9 kg  non-pitting edema noted to BUE per RN assessment  Medications: SSI Q4H, protonix, senokot Labs reviewed. CBGs 150-200-114-98  Diet Order:   Diet Order            Diet NPO time specified  Diet effective midnight                 EDUCATION NEEDS:   No education needs have been identified at this time  Skin:  Skin Assessment: Reviewed RN Assessment  Last BM:  4/9  Height:   Ht Readings from Last 1 Encounters:  10/18/20 _0  (1.651 m)    Weight:   Wt Readings from Last 1 Encounters:  10/25/20 68.9 kg    Ideal Body Weight:  56.8 kg  BMI:  Body mass index is 25.28 kg/m.  Estimated Nutritional Needs:    Kcal:  1600-1800  Protein:  80-90 grams  Fluid:  >1.6L/d    AmLarkin InaMS, RD, LDN RD pager number and weekend/on-call pager number located in [facility]

## 2020-10-25 NOTE — Plan of Care (Signed)
  Problem: Education: Goal: Knowledge of disease or condition will improve Outcome: Progressing Goal: Knowledge of secondary prevention will improve Outcome: Progressing Goal: Knowledge of patient specific risk factors addressed and post discharge goals established will improve Outcome: Progressing   Problem: Coping: Goal: Will verbalize positive feelings about self Outcome: Progressing Goal: Will identify appropriate support needs Outcome: Progressing   Problem: Health Behavior/Discharge Planning: Goal: Ability to manage health-related needs will improve Outcome: Progressing   Problem: Self-Care: Goal: Ability to participate in self-care as condition permits will improve Outcome: Progressing Goal: Verbalization of feelings and concerns over difficulty with self-care will improve Outcome: Progressing Goal: Ability to communicate needs accurately will improve Outcome: Progressing   Problem: Nutrition: Goal: Risk of aspiration will decrease Outcome: Progressing Goal: Dietary intake will improve Outcome: Progressing   Problem: Intracerebral Hemorrhage Tissue Perfusion: Goal: Complications of Intracerebral Hemorrhage will be minimized Outcome: Progressing   Problem: Education: Goal: Knowledge of General Education information will improve Description: Including pain rating scale, medication(s)/side effects and non-pharmacologic comfort measures Outcome: Progressing   Problem: Health Behavior/Discharge Planning: Goal: Ability to manage health-related needs will improve Outcome: Progressing   Problem: Clinical Measurements: Goal: Ability to maintain clinical measurements within normal limits will improve Outcome: Progressing Goal: Will remain free from infection Outcome: Progressing Goal: Diagnostic test results will improve Outcome: Progressing Goal: Respiratory complications will improve Outcome: Progressing Goal: Cardiovascular complication will be avoided Outcome:  Progressing   Problem: Nutrition: Goal: Adequate nutrition will be maintained Outcome: Progressing   Problem: Coping: Goal: Level of anxiety will decrease Outcome: Progressing   Problem: Elimination: Goal: Will not experience complications related to bowel motility Outcome: Progressing Goal: Will not experience complications related to urinary retention Outcome: Progressing   Problem: Pain Managment: Goal: General experience of comfort will improve Outcome: Progressing   Problem: Skin Integrity: Goal: Risk for impaired skin integrity will decrease Outcome: Progressing

## 2020-10-25 NOTE — Progress Notes (Signed)
[facility] PROGRESS NOTE  KESTREL MIS IWP:[id number] DOB: 11/08/1965 DOA: 08/26/2020 PCP: Patient, [name] (Inactive)    October 08, 2020     10/16/2020                        10/16/2020     10/18/2020                        10/20/2020    Status: Remains inpatient appropriate because:Altered mental status, Unsafe d/c plan, IV treatments appropriate due to intensity of illness or inability to take PO and Inpatient level of care appropriate due to severity of illness   Dispo: The patient is from: Home              Anticipated d/c is to: SNF vs brain injury-family does not wish to place patient in SNF.  If this is the only option they would like to consider taking the patient home although this may be a difficult undertaking given current requirement for trach and understandable fears about managing such complex patient in the home environment.  They have been updated as to why this patient currently is not a candidate for aggressive rehabilitative therapies like would be provided at a brain injury recovery rehab.  **3/24 Husband clarified that he has not delegated POA status to other family members.  **3/25 LCSW called patient's husband at home.  He confirmed that he is okay with daughter managing the Medicaid and disability process and has agreed to change the address to the one requested by the daughter.  Also a letter was requested by daughter to clarify this process and this has been placed in the chart. **3/31 power of attorney documentation has now been placed on the patient's chart.  Documentation stated for Dec 04, 2018              Patient currently is not medically stable to d/c.   Difficult to place patient Yes   Level of care: Progressive  Code Status: Full Family Communication:  extensive conversation via telephone with [name] [phone]) at bedside on 4/1.; [name] [phone]) is her POA and legal contact.  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 Subsequently on 2/10, she developed obtundation and respiratory arrest leading to intubation. She has remained persistently obtunded, now has a trach and a PEG. Hospital course complicated by persistent fever,Serratia pneumoniaand now pre-orbital cellulitis  As of 3/25 with adjustments and hypertonicity medications patient seems to be improving.  She is also waking up more although limited interaction but both eyes are now open and she tracks with the left eye.  Multiple discussions with husband who has confirmed he has not delegated other family members to be POA and he is the primary legal contact for patient.  He has agreed to let patient's daughter take the lead on completing and following up on Medicaid and disability applications including having these applications sent to her address.  2/09 Admit for ICH 2/10 EVD  placed, later intubated due to obtundation and respiratory arrest 2/11 MRI Brain, hypertonic saline d/c'd, EVD not working. Husband did not consent to PICC  2/13 EVD removed 2/17 [place] discussion with family, PMT 2/24 Bedside trach and PEG 3/1 Transferred to [facility]  Subjective: Seen and examined at her bedside.  Her son was present.  No acute changes.  Objective: Vitals:   10/25/20 1105 10/25/20 1135  BP:    Pulse:  87  Resp:  18  Temp: 98 F (36.7 C)   SpO2:  99%    Intake/Output Summary (Last 24 hours) at 10/25/2020 1449 Last data filed at 10/25/2020 0600 Gross per 24 hour  Intake 6930 ml   Output 1100 ml  Net 5830 ml   Filed Weights   10/23/20 0500 10/24/20 0342 10/25/20 0500  Weight: 66.9 kg 68.3 kg 68.9 kg    Exam: No significant changes from prior exam.  Constitutional: Awake, appears to be comfortable, no acute distress Respiratory: #6.0 cuffless trach, trach collar with FiO2 21% at 5L,O2 sats 97 to 99%.  Continues without spontaneous productive cough.  Does not require frequent suctioning.  Anterior lung sounds are clear to auscultation somewhat diminished in the bases.  No tachypnea. Cardiovascular: S1S2, stable tachycardia with heart rate at rest between 95 and 102 bpm.  A little bit higher while visitors and staff in the room. Abdomen:  PEG tube,  LBM 4/2 bowel sounds are present and normoactive.  Abdomen nontender. Musculoskeletal: Marked decrease in torticollis with associated left neck deviation markedly improved.  Previous hypertonicity significantly improved without any rebound spasticity with PROM Neurologic: CN 2-12 with abnormalities as follows: Blink reflex is present bilaterally although still has some medial disconjugate gaze involving the left eye. Lower extremity foot splints in place.  Able to track with right eye partially with stopping noted around mid field/midline gaze-please see OT notes from 4/7-patient has spontaneous purposeful movement that is gross motor in nature of right upper extremity Psych: Awake but nonverbal so unable to accurately assess orientation.  Bland flat affect.  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 Intraventricular penetration with blood filling the 3rd and forth ventricles and nearly filling the right lateral ventricle.  -MRI 2/11 with intraparenchymal hemorrhage centered within the right thalamus and  basal ganglia extending into the right midbrain with intraventricular extension. Effacement of prepontine cistern and similar 4 mm of leftward midline shift. Separate areas of ristricted diffusion and edema involving the right greater than left mesial temporal lobes / parahippocampal cortex, bilateral basal ganglia, and possibly the upper pons that are concerning for acute infarcts, most likely 2/2 mass effect from the hemorrhage (see report)/ Per neuro "scattered b/l hippocampus, posterior CC, bilateral thalamus, and upper pons, likely due to deep penetrating vessel compression by focal mass-effect".  -2/19 MRI with acute to early subacute infarcts in the bilateral cerebral white matter and right cerebellum, new from 2/11 - interval evolution of multiple other infarcts present on prior MRI, increased hemorrhage along old right EVD tract (see report) - 2/21 head CT with expected evolution of ICH since 2/13 - Echo with normal EF, no intracardiac source of embolism - CTA head/neck without intracranial arterial occlusion or high grade stenosis, no dissection, aneurysm, or hemodynamically significant stenosis of the carotid or vertebral arteries - Head CT 3/9 with expected evolution of hemorrhage involving the right thalamus,  basal ganglia, and adjacent white matter from prior exam with developing encephalomalacia.  Resolved previous intraventricular hemorrhage.  Persistent sulcal effacement with mild improvement.  Persistent 3 mm left to right midline shift.  Multiple areas of low attenuation consistent with infarcts within the central white matter, unchanged in distribution from prior.  (see report) - Per neurology- see last follow up note from 2/25 - [facility] [facility] likely 2/2 hypertension and nonadherence to meds  - s/p trach/peg on 2/24  - no antithrombotic 2/2 ICH -Obstructive hydrocephalus -> s/p external ventricular catheter placement on 08/27/20 by neurosurgery - EVD clamped on 2/12 and d/c'd on 2/13 -EEG  suggestive of cortical dysfunction in right hemisphere likely 2/2 underlying bleed as well as severe diffuse encephalopathy, nonspecific etiology . -Continue baclofen 5 mg at 8 AM and 4 PM and 7.5 mg at bedtime.  LFTs remain stable with baseline subtle elevation without change since initiation of baclofen. -Continue Provigil 200 mg daily as recommended by PMR team -3/22 EEG no seizures and persistent encephalopathy -Prognosis remains guarded and it is highly doubtful she will return to previous level of functioning therefore financial counseling notified to pursue disability application -Add scheduled Senokot at HS given requirement for [facility] -Per PT and OT notes dated 4/5 patient not able to participate with therapies.  Likely will sign off soon but can be reconsulted if patient is noted with improvement -Begin out of bed to chair daily and PROM every 4 hours per nursing staff  Recurrent low-grade fever -Likely secondary to combination of atelectasis and subtle aspiration noting these fevers particularly occur in the middle of the night -WBC up at 11.3 -No increased O2 needs -Check urinalysis and urine culture -as of 4/8 pending collection  Chronic pansinusitis/recurrent fevers 2/2 aspiration PNA;History of Serratia Pneumonia  Hospital Acquired Pneumonia:  -Tracheal aspirate from 2/25 and 3/1 with serratia - cefepime 2/27-3/5; repeat sputum cx 3/21 + serratia (likely colonization but w/ recurrent fever opted to treat) -Has completed antibiotics recently for bilateral orbital cellulitis -started on Cipro for tracheal aspirate positive for Serratia marcescens   -Repeat sinus CT demonstrates progressive complete bilateral maxillary sinus opacification with unchanged complete left sphenoid sinus opacification and right sphenoid sinus fluid c/w chronic state  -CT Chest 3/30 with aspiration PNA-dtr updated at bedside by attending MD- Cipro changed to Unasyn IV-Unasyn now completed -no increased O2  needs  Neck Contracture/torticollis -Preference for keeping head turned to the left. -Continue baclofen as above -- continue PT/OT weekly per their recommendations given patient's inability to effectively participate at this juncture.  Nursing staff performing PROM activities.  Family also instructed and has been performing intermittently. PT eval 4/5   Pt received supine in bed, eyes open. Maximove used to transfer pt bed to recliner. Pt tolerated this well with stable HR, O2 sats, and BP but did not engage with this any more than she has when working in supine or seated EOB. Mother present and education given on positioning in the seated position. Pt not demonstrating volitional mvmt or following any commands. We are reaching the point where pt will not longer be appropriate for acute PT due to her status not changing with stimulation and family training on ROM and positioning being close to done. New goals set for next 2 weeks but expect to sign off at that point.    OT eval 4/7:   Patient supine in bed, session focus on distinguishing patients visual input and command following.  In a quiet environment with no distractions,  patient appears to track R eye 1/3 of the way to midline (up to 50% by end of session) towards L when given min to mod cueing and fixating on therapist approx 50% of the time (3/6 trials) for 1 second.  Also, given 1 step simple command, mod cueing (using her name before cue) and up to 3 minute delay (with R hand in optimal position- wrist supported) pt able to move R thumb. With seeing what appears to be minimal visual tracking, fixating, and thumb movement volitionally (although it is still hard to determine vs spasticity at times), OT will give patient a trial of 2x/week visits to focus on and strengthen these skills in hopes to allow to progress towards basic communication of distinguishing between 2 items or pictures (yes/no) or strengthening "thumbs up". Plan to occlude L  eye with nasal occlusion glasses to strengthen R eye. Also discussed with RN option of moving patient to opposite side of hallway to provide more stimulation/input towards L side for encouragement of patient to scan towards midline with R eye. Updated goals.      -OT would also like to have patient placed in a different room across the hallway which would allow for optimal visualization using right eye based on positioning in bed and said room.  Hypertensive emergency/Malignant HTN Resolved -Home medications include Coreg and HCTZ which have not been reordered -On 4/7 BP was trending upward therefore Norvasc increased from 5 mg to 10 mg.  As of 4/8 BP now softer/suboptimal therefore have opted to hold doses today, decrease Norvasc 2.5 mg and decrease metoprolol from 125 mg to 100 mg twice daily and follow trends. -Continue to treat underlying causes such as pain.  Acute hypoxemic respiratory failure secondary to CVA requiring mechanical ventilation and tracheostomy tube -Stable on trach collar and has been transitioned to a #6 cuffless trach -Has significant altered mentation and inability to manage oral secretions at present time is not a safe candidate for decannulation.  Discussed this with family as major obstacle in discharging to home.  This will also decrease options for SNF placement. -Appreciate trach team assistance in management -See above regarding evaluation for recurrent low-grade fevers.  Pain likely related to underlying hypertonicity -Morphine IV, oxycodone and Tylenol prn (watch elev LFTs) -Treat underlying hypertonicity with skeletal muscle relaxers and uptitrate as tolerated with goal to improve and resolve hypertonicity without contributing to oversedation  Dysphagia/Nutrition Status: Nutrition Problem: Inadequate oral intake Etiology: inability to eat Signs/Symptoms: NPO status Interventions: Tube feeding consisting of Osmolite 1.2 Cal at 55 cc/h; continue Prosource  liquid 45 cc daily, free water 100 cc every 4 hours per tube; will also continue to follow CBGs regularly while on tube feedings and provide SSI Estimated body mass index is 25.28 kg/m as calculated from the following:   Height as of this encounter: $RemoveBeforeD'5\' 5"'[id number]$  (1.651 m).   Weight as of this encounter: 68.9 kg. If patient eventually discharged to home to teach family how to bolus feed as opposed to utilizing continuous tube feeding infusions.   Other problems: Anasarca -bilateral upper extremity edema, minimal LE edema - suspect related to prolonged hospitalization, hypoalbuminemia -upper extremity US-> age indeterminate SVT of right cephalic vein  Elevated LFT's - Remain elevated but stable without any obstructive pattern -Continue baclofen and statin  Bacterial Conjunctivitis/periorbital cellulitis - MRI 3/7 orbits with L periorbital cellulitis  -- CT head 3/9 with persistent subtotal opacification of paranasal sinuses and mastoid air cells, bubbly lucency at base of R  maxillary sinus (likely odontogenic in origin) - CT maxillofacial 3/12 - improving cellulitis  - s/p erythromycin ointment - completed course of Ancef   Anemia -Stable, follow  Sinus Tachycardia Better on BB and after treatment of pain/hypertonicity Echo with EF 60-65% (see report)   Data Reviewed: Basic Metabolic Panel: Recent Labs  Lab 10/19/20 0510 10/24/20 0329  NA 140 140  K 4.1 4.1  CL 102 104  CO2 27 28  GLUCOSE 136* 137*  BUN 20 19  CREATININE 0.69 0.60  CALCIUM 9.3 8.8*   Liver Function Tests: Recent Labs  Lab 10/19/20 0510  AST 45*  ALT 78*  ALKPHOS 93  BILITOT 0.4  PROT 7.1  ALBUMIN 2.6*   No results for input(s): LIPASE, AMYLASE in the last 168 hours. No results for input(s): AMMONIA in the last 168 hours. CBC: Recent Labs  Lab 10/22/20 1139 10/24/20 0329  WBC 11.3* 7.2  HGB 9.8* 8.4*  HCT 31.2* 26.4*  MCV 82.1 82.5  PLT 390 351   Cardiac Enzymes: No results for  input(s): CKTOTAL, CKMB, CKMBINDEX, TROPONINI in the last 168 hours. BNP (last 3 results) No results for input(s): BNP in the last 8760 hours.  ProBNP (last 3 results) No results for input(s): PROBNP in the last 8760 hours.  CBG: Recent Labs  Lab 10/24/20 1927 10/24/20 2328 10/25/20 0345 10/25/20 0720 10/25/20 1104  GLUCAP 132* 153* 105* 150* 200*    No results found for this or any previous visit (from the past 240 hour(s)).   Studies: No results found.  Scheduled Meds: . amLODipine  2.5 mg Per Tube Daily  . atorvastatin  40 mg Per Tube Daily  . baclofen  5 mg Per Tube 2 times per day  . baclofen  7.5 mg Per Tube Daily  . chlorhexidine gluconate (MEDLINE KIT)  15 mL Mouth Rinse BID  . Chlorhexidine Gluconate Cloth  6 each Topical Daily  . enoxaparin (LOVENOX) injection  40 mg Subcutaneous Q24H  . feeding supplement (PROSource TF)  45 mL Per Tube Daily  . free water  100 mL Per Tube Q4H  . hydrALAZINE  50 mg Per Tube Q8H  . insulin aspart  0-9 Units Subcutaneous Q4H  . mouth rinse  15 mL Mouth Rinse 5 X Daily  . metoprolol tartrate  100 mg Per Tube BID  . modafinil  200 mg Per Tube Daily  . pantoprazole sodium  40 mg Per Tube BID  . scopolamine  1 patch Transdermal Q72H  . sennosides  5 mL Per Tube QHS   Continuous Infusions: . sodium chloride Stopped (09/28/20 1344)  . feeding supplement (OSMOLITE 1.2 CAL) 1,000 mL (10/25/20 0029)    Principal Problem:   ICH (intracerebral hemorrhage) (HCC) Active Problems:   Intraparenchymal hemorrhage of brain (HCC)   Acute hypoxemic respiratory failure (HCC)   Fever   Endotracheal tube present   Status post tracheostomy ([place])   Palliative care by specialist   Muscle hypertonicity   Oropharyngeal dysphagia   Brain injury with loss of consciousness ([place])   Poor prognosis   Aspiration pneumonia of right lower lobe [facility])   Consultants:  Palliative care  Pulm  Neurology   Procedures:  Tracheostomy  PEG  tube  EEG  Echocardiogram  Antibiotics: Anti-infectives (From admission, onward)   Start     Dose/Rate Route Frequency Ordered Stop   10/14/20 1815  Ampicillin-Sulbactam (UNASYN) 3 g in sodium chloride 0.9 % 100 mL IVPB  3 g 200 mL/hr over 30 Minutes Intravenous Every 6 hours 10/14/20 1754 10/19/20 1340   10/08/20 0845  ciprofloxacin (CIPRO) tablet 500 mg  Status:  Discontinued        500 mg Per Tube 2 times daily 10/08/20 0754 10/14/20 1731   09/21/20 1545  ceFAZolin (ANCEF) IVPB 1 g/50 mL premix        1 g 100 mL/hr over 30 Minutes Intravenous Every 8 hours 09/21/20 1457 09/28/20 0647   09/20/20 0600  ceFAZolin (ANCEF) IVPB 1 g/50 mL premix  Status:  Discontinued        1 g 100 mL/hr over 30 Minutes Intravenous Every 8 hours 09/14/20 0903 09/14/20 0904   09/16/20 1300  vancomycin (VANCOREADY) IVPB 1250 mg/250 mL  Status:  Discontinued        1,250 mg 166.7 mL/hr over 90 Minutes Intravenous Every 24 hours 09/15/20 1232 09/17/20 0956   09/15/20 1230  vancomycin (VANCOREADY) IVPB 1250 mg/250 mL        1,250 mg 166.7 mL/hr over 90 Minutes Intravenous  Once 09/15/20 1131 09/15/20 1402   09/13/20 1400  ceFEPIme (MAXIPIME) 2 g in sodium chloride 0.9 % 100 mL IVPB        2 g 200 mL/hr over 30 Minutes Intravenous Every 8 hours 09/13/20 1341 09/19/20 2208   09/06/20 0600  vancomycin (VANCOREADY) IVPB 1250 mg/250 mL        1,250 mg 166.7 mL/hr over 90 Minutes Intravenous  Once 09/06/20 0541 09/06/20 0739   09/05/20 1400  ceFAZolin (ANCEF) IVPB 1 g/50 mL premix  Status:  Discontinued        1 g 100 mL/hr over 30 Minutes Intravenous Every 8 hours 09/05/20 1120 09/13/20 1341   08/30/20 0600  vancomycin (VANCOREADY) IVPB 500 mg/100 mL  Status:  Discontinued        500 mg 100 mL/hr over 60 Minutes Intravenous Every 12 hours 08/29/20 1554 09/01/20 1115   08/29/20 1700  ceFEPIme (MAXIPIME) 2 g in sodium chloride 0.9 % 100 mL IVPB  Status:  Discontinued        2 g 200 mL/hr over 30  Minutes Intravenous Every 12 hours 08/29/20 1554 09/01/20 1115   08/29/20 1645  vancomycin (VANCOREADY) IVPB 1250 mg/250 mL        1,250 mg 166.7 mL/hr over 90 Minutes Intravenous  Once 08/29/20 1554 08/29/20 1820       Time spent: 25 minutes    Kayleen Memos ANP  [facility] 7 am - 330 pm/M-F for direct patient care and secure chat Please refer to Amion for contact info 59  days

## 2020-10-26 DIAGNOSIS — R8290 Unspecified abnormal findings in urine: Secondary | ICD-10-CM

## 2020-10-26 DIAGNOSIS — A499 Bacterial infection, unspecified: Secondary | ICD-10-CM

## 2020-10-26 LAB — GLUCOSE, CAPILLARY
Glucose-Capillary: 108 mg/dL — ABNORMAL HIGH (ref 70–99)
Glucose-Capillary: 133 mg/dL — ABNORMAL HIGH (ref 70–99)
Glucose-Capillary: 134 mg/dL — ABNORMAL HIGH (ref 70–99)
Glucose-Capillary: 135 mg/dL — ABNORMAL HIGH (ref 70–99)
Glucose-Capillary: 147 mg/dL — ABNORMAL HIGH (ref 70–99)
Glucose-Capillary: 97 mg/dL (ref 70–99)

## 2020-10-26 MED ORDER — SODIUM CHLORIDE 0.9 % IV SOLN
1.0000 g | INTRAVENOUS | Status: DC
  Administered 2020-10-26: 1 g via INTRAVENOUS
  Filled 2020-10-26: qty 10

## 2020-10-26 NOTE — Progress Notes (Addendum)
[facility] PROGRESS NOTE  Charlene [name] ZES:[id number] DOB: 1965-07-26 DOA: 08/26/2020 PCP: Patient, [name] (Inactive)    October 08, 2020     10/16/2020                        10/16/2020     10/18/2020                        10/20/2020    Status: Remains inpatient appropriate because:Altered mental status, Unsafe d/c plan, IV treatments appropriate due to intensity of illness or inability to take PO and Inpatient level of care appropriate due to severity of illness   Dispo: The patient is from: Home              Anticipated d/c is to: SNF vs brain injury-family does not wish to place patient in SNF.  If this is the only option they would like to consider taking the patient home although this may be a difficult undertaking given current requirement for trach and understandable fears about managing such complex patient in the home environment.  They have been updated as to why this patient currently is not a candidate for aggressive rehabilitative therapies like would be provided at a brain injury recovery rehab.               Patient currently is not medically stable to d/c.   Difficult to place patient Yes   Level of care: Progressive  Code Status: Full Family Communication:  extensive conversation via telephone with daughter Charlene [name] [phone]) at bedside on 4/1.; husband Charlene [name] [phone]) is her POA and legal contact.  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 Subsequently on 2/10, she developed  obtundation and respiratory arrest leading to intubation. She has remained persistently obtunded, now has a trach and a PEG. Hospital course complicated by persistent fever,Serratia pneumoniaand now pre-orbital cellulitis  As of 3/25 with adjustments and hypertonicity medications patient seems to be improving.  She is also waking up more although limited interaction but both eyes are now open and she tracks with the left eye.  Multiple discussions with husband who has confirmed he has not delegated other family members to be POA and he is the primary legal contact for patient.  He has agreed to let patient's daughter take the lead on completing and following up on Medicaid and disability applications including having these applications sent to her address.  2/09 Admit for [name] 2/10 EVD placed, later intubated due to obtundation and respiratory arrest 2/11 MRI Brain, hypertonic saline d/c'd, EVD not working. Husband did not consent to PICC  2/13 EVD removed 2/17 [place] discussion with family, PMT 2/24 Bedside trach and PEG 3/1 Transferred to [name]  Subjective: Eyes are open.  Not following commands.  Tracking with right and looking around the room independently.  Objective: Vitals:   10/26/20 0350 10/26/20 0655  BP: 119/76 (!) 141/94  Pulse: 79   Resp: 16   Temp: 99.6 F (37.6 C)  SpO2: 98%     Intake/Output Summary (Last 24 hours) at 10/26/2020 0741 Last data filed at 10/26/2020 0400 Gross per 24 hour  Intake --  Output 1801 ml  Net -1801 ml   Filed Weights   10/23/20 0500 10/24/20 0342 10/25/20 0500  Weight: 66.9 kg 68.3 kg 68.9 kg    Exam:  Constitutional: Awake, appears to be alert, appears to be comfortable Respiratory: #6.0 cuffless trach, trach collar with FiO2 21% at 5L,O2 sats 97 to 99%.  Lungs are clear to auscultation no tracheal secretions noted Cardiovascular: S1-S2, no peripheral edema, pulse regular Abdomen:  PEG tube, soft nontender.  Normoactive bowel sounds  LBM 4/9 Musculoskeletal: Marked decrease in torticollis with associated left neck deviation markedly improved.  Previous hypertonicity significantly improved without any rebound spasticity with PROM Neurologic: CN 2-12 with abnormalities as follows: Blink reflex is present bilaterally although still has some medial disconjugate gaze involving the left eye. Lower extremity foot splints in place.  Able to track with right eye partially with stopping noted around mid field/midline gaze-please see OT notes from 4/7-patient has spontaneous purposeful movement that is gross motor in nature of right upper extremity.  Touched bottom of right foot and patient withdrew foot secondary to sensation of tickling.  Frowned. 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 Intraventricular penetration with blood filling the 3rd and forth ventricles and nearly filling the right lateral ventricle.  -MRI 2/11 with intraparenchymal hemorrhage centered within the right thalamus and basal ganglia extending into the right midbrain with intraventricular extension. Effacement of prepontine cistern and similar 4 mm of leftward midline shift. Separate areas of ristricted diffusion and edema involving the right greater than left mesial temporal lobes / parahippocampal cortex, bilateral basal ganglia, and possibly the upper pons that are concerning for acute infarcts, most likely 2/2 mass effect from the hemorrhage (see report)/ Per neuro "scattered b/l hippocampus, posterior CC, bilateral thalamus, and upper pons, likely due to deep penetrating vessel compression by focal mass-effect".  -2/19 MRI with acute to early subacute infarcts in the  bilateral cerebral white matter and right cerebellum, new from 2/11 - interval evolution of multiple other infarcts present on prior MRI, increased hemorrhage along old right EVD tract (see report) - 2/21 head CT with expected evolution of ICH since 2/13 - Echo with normal EF, no intracardiac source of embolism - CTA head/neck without intracranial arterial occlusion or high grade stenosis, no dissection, aneurysm, or hemodynamically significant stenosis of the carotid or vertebral arteries - Head CT 3/9 with expected evolution of hemorrhage involving the right thalamus, basal ganglia, and adjacent white matter from prior exam with developing encephalomalacia.  Resolved previous intraventricular hemorrhage.  Persistent sulcal effacement with mild improvement.  Persistent 3 mm left to right midline shift.  Multiple areas of low attenuation consistent with infarcts within the central white matter, unchanged in distribution from prior.  (see report) - Per neurology- see last follow up note from 2/25 - [place] likely 2/2 hypertension and nonadherence to meds  - s/p trach/peg on 2/24  - no antithrombotic 2/2 ICH -Obstructive hydrocephalus -> s/p external ventricular catheter placement on 08/27/20 by neurosurgery - EVD clamped on 2/12 and d/c'd on 2/13 -EEG suggestive of cortical dysfunction in right hemisphere likely 2/2 underlying bleed as well  as severe diffuse encephalopathy, nonspecific etiology . -Continue baclofen 5 mg at 8 AM and 4 PM and 7.5 mg at bedtime.  LFTs remain stable with baseline subtle elevation without change since initiation of baclofen. -Continue Provigil 200 mg daily as recommended by PMR team -3/22 EEG no seizures and persistent encephalopathy -Prognosis remains guarded and it is highly doubtful she will return to previous level of functioning therefore financial counseling notified to pursue disability application -Add scheduled Senokot at HS given requirement for [facility] -Per PT and OT  notes dated 4/5 patient not able to participate with therapies.  Likely will sign off soon but can be reconsulted if patient is noted with improvement -Begin out of bed to chair daily and PROM every 4 hours per nursing staff -Per OT recommendations have made staff aware that patient needs to move to outside of the hall to improve visual-spatial interaction and acuity.  Recurrent low-grade fever -Likely secondary to combination of atelectasis and subtle aspiration noting these fevers particularly occur in the middle of the night -WBC up at 11.3 -No increased O2 needs -Check urinalysis was unremarkable in appearance but culture did demonstrate greater than 100,000 colonies of gram-negative rods.  Will initiate IV Rocephin and follow-up on sensitivities.  Chronic pansinusitis/recurrent fevers 2/2 aspiration PNA;History of Serratia Pneumonia  Hospital Acquired Pneumonia:  -Tracheal aspirate from 2/25 and 3/1 with serratia - cefepime 2/27-3/5; repeat sputum cx 3/21 + serratia (likely colonization but w/ recurrent fever opted to treat) -Has completed antibiotics recently for bilateral orbital cellulitis -started on Cipro for tracheal aspirate positive for Serratia marcescens   -Repeat sinus CT demonstrates progressive complete bilateral maxillary sinus opacification with unchanged complete left sphenoid sinus opacification and right sphenoid sinus fluid c/w chronic state  -CT Chest 3/30 with aspiration PNA-dtr updated at bedside by attending MD- Cipro changed to Unasyn IV-Unasyn now completed -no increased O2 needs  Tachycardia/suboptimal BP -Was given 1L of NS and vfree water increased fom 100 c q 4 hrs to 200 cc  -HR and BP improved -Norvasc was decreased to 2.5 mg 4/8  Neck Contracture/torticollis -Preference for keeping head turned to the left. -Continue baclofen as above -- continue PT/OT weekly per their recommendations given patient's inability to effectively participate at this  juncture.  Nursing staff performing PROM activities.  Family also instructed and has been performing intermittently. PT eval 4/5   Pt received supine in bed, eyes open. Maximove used to transfer pt bed to recliner. Pt tolerated this well with stable HR, O2 sats, and BP but did not engage with this any more than she has when working in supine or seated EOB. Mother present and education given on positioning in the seated position. Pt not demonstrating volitional mvmt or following any commands. We are reaching the point where pt will not longer be appropriate for acute PT due to her status not changing with stimulation and family training on ROM and positioning being close to done. New goals set for next 2 weeks but expect to sign off at that point.    OT eval 4/7:   Patient supine in bed, session focus on distinguishing patients visual input and command following.  In a quiet environment with no distractions, patient appears to track R eye 1/3 of the way to midline (up to 50% by end of session) towards L when given min to mod cueing and fixating on therapist approx 50% of the time (3/6 trials) for 1 second.  Also, given 1 step simple command, mod  cueing (using her name before cue) and up to 3 minute delay (with R hand in optimal position- wrist supported) pt able to move R thumb. With seeing what appears to be minimal visual tracking, fixating, and thumb movement volitionally (although it is still hard to determine vs spasticity at times), OT will give patient a trial of 2x/week visits to focus on and strengthen these skills in hopes to allow to progress towards basic communication of distinguishing between 2 items or pictures (yes/no) or strengthening "thumbs up". Plan to occlude L eye with nasal occlusion glasses to strengthen R eye. Also discussed with RN option of moving patient to opposite side of hallway to provide more stimulation/input towards L side for encouragement of patient to scan towards midline  with R eye. Updated goals.      -OT would also like to have patient placed in a different room across the hallway which would allow for optimal visualization using right eye based on positioning in bed and said room.  Hypertensive emergency/Malignant HTN Resolved -Home medications include Coreg and HCTZ which have not been reordered -On 4/7 BP was trending upward therefore Norvasc increased from 5 mg to 10 mg.  As of 4/8 BP now softer/suboptimal therefore have opted to hold doses today, decrease Norvasc 2.5 mg and decrease metoprolol from 125 mg to 100 mg twice daily and follow trends. -Continue to treat underlying causes such as pain.  Acute hypoxemic respiratory failure secondary to CVA requiring mechanical ventilation and tracheostomy tube -Stable on trach collar and has been transitioned to a #6 cuffless trach -Has significant altered mentation and inability to manage oral secretions at present time is not a safe candidate for decannulation.  Discussed this with family as major obstacle in discharging to home.  This will also decrease options for SNF placement. -Appreciate trach team assistance in management -See above regarding evaluation for recurrent low-grade fevers.  Pain likely related to underlying hypertonicity -Morphine IV, oxycodone and Tylenol prn (watch elev LFTs) -Treat underlying hypertonicity with skeletal muscle relaxers and uptitrate as tolerated with goal to improve and resolve hypertonicity without contributing to oversedation  Dysphagia/Nutrition Status: Nutrition Problem: Inadequate oral intake Etiology: inability to eat Signs/Symptoms: NPO status Interventions: Tube feeding consisting of Osmolite 1.2 Cal at 55 cc/h; continue Prosource liquid 45 cc daily, free water 100 cc every 4 hours per tube; will also continue to follow CBGs regularly while on tube feedings and provide SSI Estimated body mass index is 25.28 kg/m as calculated from the following:   Height as  of this encounter: '5\' 5"'  (1.651 m).   Weight as of this encounter: 68.9 kg. If patient eventually discharged to home to teach family how to bolus feed as opposed to utilizing continuous tube feeding infusions.   Other problems: Anasarca -bilateral upper extremity edema, minimal LE edema - suspect related to prolonged hospitalization, hypoalbuminemia -upper extremity US-> age indeterminate SVT of right cephalic vein  Elevated LFT's - Remain elevated but stable without any obstructive pattern -Continue baclofen and statin  Bacterial Conjunctivitis/periorbital cellulitis - MRI 3/7 orbits with L periorbital cellulitis  -- CT head 3/9 with persistent subtotal opacification of paranasal sinuses and mastoid air cells, bubbly lucency at base of R maxillary sinus (likely odontogenic in origin) - CT maxillofacial 3/12 - improving cellulitis  - s/p erythromycin ointment - completed course of Ancef   Anemia -Stable, follow  Sinus Tachycardia Better on BB and after treatment of pain/hypertonicity Echo with EF 60-65% (see report)   Data Reviewed:  Basic Metabolic Panel: Recent Labs  Lab 10/24/20 0329  NA 140  K 4.1  CL 104  CO2 28  GLUCOSE 137*  BUN 19  CREATININE 0.60  CALCIUM 8.8*   Liver Function Tests: No results for input(s): AST, ALT, ALKPHOS, BILITOT, PROT, ALBUMIN in the last 168 hours. No results for input(s): LIPASE, AMYLASE in the last 168 hours. No results for input(s): AMMONIA in the last 168 hours. CBC: Recent Labs  Lab 10/22/20 1139 10/24/20 0329  WBC 11.3* 7.2  HGB 9.8* 8.4*  HCT 31.2* 26.4*  MCV 82.1 82.5  PLT 390 351   Cardiac Enzymes: No results for input(s): CKTOTAL, CKMB, CKMBINDEX, TROPONINI in the last 168 hours. BNP (last 3 results) No results for input(s): BNP in the last 8760 hours.  ProBNP (last 3 results) No results for input(s): PROBNP in the last 8760 hours.  CBG: Recent Labs  Lab 10/25/20 1104 10/25/20 1522 10/25/20 2027  10/25/20 2319 10/26/20 0413  GLUCAP 200* 114* 98 138* 108*    No results found for this or any previous visit (from the past 240 hour(s)).   Studies: No results found.  Scheduled Meds: . amLODipine  2.5 mg Per Tube Daily  . atorvastatin  40 mg Per Tube Daily  . baclofen  5 mg Per Tube 2 times per day  . baclofen  7.5 mg Per Tube Daily  . chlorhexidine gluconate (MEDLINE KIT)  15 mL Mouth Rinse BID  . Chlorhexidine Gluconate Cloth  6 each Topical Daily  . enoxaparin (LOVENOX) injection  40 mg Subcutaneous Q24H  . feeding supplement (PROSource TF)  45 mL Per Tube Daily  . free water  100 mL Per Tube Q4H  . hydrALAZINE  50 mg Per Tube Q8H  . insulin aspart  0-9 Units Subcutaneous Q4H  . mouth rinse  15 mL Mouth Rinse 5 X Daily  . metoprolol tartrate  100 mg Per Tube BID  . modafinil  200 mg Per Tube Daily  . pantoprazole sodium  40 mg Per Tube BID  . scopolamine  1 patch Transdermal Q72H  . sennosides  5 mL Per Tube QHS   Continuous Infusions: . sodium chloride Stopped (09/28/20 1344)  . feeding supplement (OSMOLITE 1.2 CAL) 1,000 mL (10/26/20 0008)    Principal Problem:   ICH (intracerebral hemorrhage) (HCC) Active Problems:   Intraparenchymal hemorrhage of brain (HCC)   Acute hypoxemic respiratory failure (HCC)   Fever   Endotracheal tube present   Status post tracheostomy ([place])   Palliative care by specialist   Muscle hypertonicity   Oropharyngeal dysphagia   Brain injury with loss of consciousness ([place])   Poor prognosis   Aspiration pneumonia of right lower lobe [facility])   Consultants:  Palliative care  Pulm  Neurology   Procedures:  Tracheostomy  PEG tube  EEG  Echocardiogram  Antibiotics: Anti-infectives (From admission, onward)   Start     Dose/Rate Route Frequency Ordered Stop   10/14/20 1815  Ampicillin-Sulbactam (UNASYN) 3 g in sodium chloride 0.9 % 100 mL IVPB        3 g 200 mL/hr over 30 Minutes Intravenous Every 6 hours 10/14/20 1754  10/19/20 1340   10/08/20 0845  ciprofloxacin (CIPRO) tablet 500 mg  Status:  Discontinued        500 mg Per Tube 2 times daily 10/08/20 0754 10/14/20 1731   09/21/20 1545  ceFAZolin (ANCEF) IVPB 1 g/50 mL premix        1 g 100 mL/hr  over 30 Minutes Intravenous Every 8 hours 09/21/20 1457 09/28/20 0647   09/20/20 0600  ceFAZolin (ANCEF) IVPB 1 g/50 mL premix  Status:  Discontinued        1 g 100 mL/hr over 30 Minutes Intravenous Every 8 hours 09/14/20 0903 09/14/20 0904   09/16/20 1300  vancomycin (VANCOREADY) IVPB 1250 mg/250 mL  Status:  Discontinued        1,250 mg 166.7 mL/hr over 90 Minutes Intravenous Every 24 hours 09/15/20 1232 09/17/20 0956   09/15/20 1230  vancomycin (VANCOREADY) IVPB 1250 mg/250 mL        1,250 mg 166.7 mL/hr over 90 Minutes Intravenous  Once 09/15/20 1131 09/15/20 1402   09/13/20 1400  ceFEPIme (MAXIPIME) 2 g in sodium chloride 0.9 % 100 mL IVPB        2 g 200 mL/hr over 30 Minutes Intravenous Every 8 hours 09/13/20 1341 09/19/20 2208   09/06/20 0600  vancomycin (VANCOREADY) IVPB 1250 mg/250 mL        1,250 mg 166.7 mL/hr over 90 Minutes Intravenous  Once 09/06/20 0541 09/06/20 0739   09/05/20 1400  ceFAZolin (ANCEF) IVPB 1 g/50 mL premix  Status:  Discontinued        1 g 100 mL/hr over 30 Minutes Intravenous Every 8 hours 09/05/20 1120 09/13/20 1341   08/30/20 0600  vancomycin (VANCOREADY) IVPB 500 mg/100 mL  Status:  Discontinued        500 mg 100 mL/hr over 60 Minutes Intravenous Every 12 hours 08/29/20 1554 09/01/20 1115   08/29/20 1700  ceFEPIme (MAXIPIME) 2 g in sodium chloride 0.9 % 100 mL IVPB  Status:  Discontinued        2 g 200 mL/hr over 30 Minutes Intravenous Every 12 hours 08/29/20 1554 09/01/20 1115   08/29/20 1645  vancomycin (VANCOREADY) IVPB 1250 mg/250 mL        1,250 mg 166.7 mL/hr over 90 Minutes Intravenous  Once 08/29/20 1554 08/29/20 1820       Time spent: 25 minutes    Erin Hearing ANP  [facility] 7 am - 330  pm/M-F for direct patient care and secure chat Please refer to Amion for contact info 60  days

## 2020-10-26 NOTE — Progress Notes (Signed)
NAME:  Charlene [name], MRN:  [id number], DOB:  06-Jul-1966, LOS: 60 ADMISSION DATE:  08/26/2020, CONSULTATION DATE:  2/10 REFERRING MD:  Dr. Roda Shutters, CHIEF COMPLAINT:  ICH   Brief History:  55 year old female admitted with hypertensive R thalamic/basal ganglia ICH. Resultant obstructive hydrocephalus prompting EVD placement 2/10. Subsequently, 2/10 she had acute mental status change to obtundation and respiratory arrest resulting in intubation. Trach and Peg 2/24. Trach change to #6 cuffless on 3/11  Past Medical History:  HTN, medication noncompliance  Significant Hospital Events:  2/09 Admit for ICH 2/10 EVD placed, later intubated due to obtundation and respiratory arrest 2/11 MRI Brain, hypertonic saline d/c'd, EVD not working.  Husband did not consent to PICC  2/13 EVD removed 2/17 GOC discussion with family, PMT 3/11 trach changed to Shiley 6 uncuffed  Consults:  Stroke PCCM Neurosurgery  Procedures:  ETT 2/10 >> 09/10/2020 EVD 2/10 > non functioning 2/11 at 1500; clamped 2/12 am > 2/13 Trach and diagnostic Bronchoscopy 2/24>> Dr. Denese Killings PEG placement 2/24>> Dr. Sheliah Hatch  Significant Diagnostic Tests:  CT Head 2/9 >> Acute hemorrhage with the epicenter in the right thalamus. Measurement is approximately 3.2 x 2.1 x 2.5 cm (volume 8.8 cm^3). Intraventricular penetration with blood filling the third and fourth ventricles and nearly filling the right lateral ventricle. No hydrocephalus. CTA Head/Neck 2/10 >> Unchanged size of intraparenchymal hemorrhage centered in the right thalamus and basal ganglia with extension into the right lateral ventricle. 2. No intracranial arterial occlusion or high-grade stenosis. No dissection, aneurysm or hemodynamically significant stenosis of the carotid or vertebral arteries. CT Head 2/10 >> Stable parenchymal hemorrhage in the right thalamus and basal ganglia with intraventricular extension as described. No new focal abnormality is noted. CT Head  2/10 (post intubation) >> redemonstrated intraparenchymal hemorrhage centered within the R thalamus/basal ganglia extending into the cerebral peduncle/midbrain with associated intraventricular extension and hemorrhage in the R lateral ventricle and fourth ventricle, overall not significantly changed from prior; stable mass effect, 60mm of R to L midline shift, postsurgical changes of ventriculostomy TTE 2/10 >> LVEF 60 to 65%, LV has normal function, no regional wall motion abnormalities, mild concentric LVH.  RV systolic function is normal, normal pulmonary artery systolic pressure.  MRA head 2/11 >>Negative intracranial MR angiography. No evidence of aneurysm or high flow vascular malformation to explain the intraparenchymal hemorrhage  MRI brain 2/11 >> Similar intraparenchymal hemorrhage centered within the right thalamus and basal ganglia extending into the right midbrain with intraventricular extension, effacement of the prepontine cistern and similar 4 mm of leftward midline shift; separate areas of restricted diffusion and edema involving the R > L mesial temporal lobes/parahippocampal cortex, bilateral basal ganglia, and possibly the upper pons that are c/f acute infarcts, most likely secondary to mass effect from hemorrhage. MRI brain 2/19 >> intraventricular clot has decreased in size but mid-brain parenchymal clot has not and there are more ischemic infarcts in the surrounding brain.  CT Head WO Contrast 09/07/20>> Expected evolution of intracranial hemorrhage since 08/30/2020 head CT. Multiple areas of low attenuation corresponding to some of the infarcts seen on prior MRI. Ventricle caliber similar to prior MRI   Micro Data:  2/9 COVID + Influenza negative 2/10 MRSA PCR negative 2/12 BCx 2 > no growth  2/19 BC 1/2 +Stap EPI, 1/2 no growth  10/07/2020 sputum positive for Serratia marcescens 4/8 UC GNR>>> Antimicrobials:  Cefepime 2/12 > 2/15 Vanc 2/12 > 2/15 10/08/2020  ciprofloxacin>>completed.   Interim History / Subjective:  Objective   Blood pressure 122/73, pulse 83, temperature 98.6 F (37 C), temperature source Axillary, resp. rate 17, height 5\' 5"  (1.651 m), weight 68.9 kg, last menstrual period 03/03/2014, SpO2 98 %, unknown if currently breastfeeding.    FiO2 (%):  [21 %] 21 %   Intake/Output Summary (Last 24 hours) at 10/26/2020 0953 Last data filed at 10/26/2020 0400 Gross per 24 hour  Intake no documentation  Output 1801 ml  Net -1801 ml   Filed Weights   10/23/20 0500 10/24/20 0342 10/25/20 0500  Weight: 66.9 kg 68.3 kg 68.9 kg   General resting in bed no acute distress HEENT normocephalic atraumatic size 6 tracheostomy in place Pulmonary clear to auscultation without accessory use currently on humidified room air Cardiac: Regular rate and rhythm Neuro: Awake contracted GU clear yellow via pure wick Extremities contracted   Resolved Hospital Problem list   Hypernatremia  Obstructive hydrocephalus (resolved) Hypertensive emergency Acute hypoxemic respiratory failure secondary to CVA Serratia PNA  Bacterial Conjunctivitis   Assessment & Plan:   12/25/20 dependent s/p Multiple acute strokes c/b Intraparenchymal hemorrhage with right thalamic hemorrhage with extension into the third fourth and right lateral ventricle Torticollis/neck contracture  Dysphagia s/p PEG Anemia Sepsis 2/2 GNR Urinary tract infection (new 4/8)  Active Pulm problem list  Trach dependent s/p Multiple acute strokes c/b Intraparenchymal hemorrhage with right thalamic hemorrhage with extension into the third fourth and right lateral ventricle  Interval From trach standpoint no change.  Had low gd fever w/ mild wbc bump. UC sent. Growing GNR  Plan: Cont routine trach care We will cont to see weekly rx of UTI per primary service   6/8 ACNP-BC [facility] Pulmonary/Critical Care Pager # [phone] OR # [phone] if no answer

## 2020-10-26 NOTE — Plan of Care (Signed)
  Problem: Education: Goal: Knowledge of disease or condition will improve Outcome: Not Progressing Goal: Knowledge of secondary prevention will improve Outcome: Not Progressing Goal: Knowledge of patient specific risk factors addressed and post discharge goals established will improve Outcome: Not Progressing   Problem: Coping: Goal: Will verbalize positive feelings about self Outcome: Not Progressing Goal: Will identify appropriate support needs Outcome: Not Progressing   Problem: Health Behavior/Discharge Planning: Goal: Ability to manage health-related needs will improve Outcome: Not Progressing   Problem: Self-Care: Goal: Ability to participate in self-care as condition permits will improve Outcome: Not Progressing Goal: Verbalization of feelings and concerns over difficulty with self-care will improve Outcome: Not Progressing Goal: Ability to communicate needs accurately will improve Outcome: Not Progressing   Problem: Nutrition: Goal: Risk of aspiration will decrease Outcome: Progressing Goal: Dietary intake will improve Outcome: Completed/Met   Problem: Intracerebral Hemorrhage Tissue Perfusion: Goal: Complications of Intracerebral Hemorrhage will be minimized Outcome: Progressing

## 2020-10-27 DIAGNOSIS — A498 Other bacterial infections of unspecified site: Secondary | ICD-10-CM | POA: Diagnosis not present

## 2020-10-27 LAB — GLUCOSE, CAPILLARY
Glucose-Capillary: 114 mg/dL — ABNORMAL HIGH (ref 70–99)
Glucose-Capillary: 128 mg/dL — ABNORMAL HIGH (ref 70–99)
Glucose-Capillary: 131 mg/dL — ABNORMAL HIGH (ref 70–99)
Glucose-Capillary: 134 mg/dL — ABNORMAL HIGH (ref 70–99)
Glucose-Capillary: 134 mg/dL — ABNORMAL HIGH (ref 70–99)
Glucose-Capillary: 97 mg/dL (ref 70–99)

## 2020-10-27 MED ORDER — SODIUM CHLORIDE 0.9 % IV SOLN
1.0000 g | Freq: Three times a day (TID) | INTRAVENOUS | Status: DC
  Administered 2020-10-27 – 2020-11-01 (×15): 1 g via INTRAVENOUS
  Filled 2020-10-27 (×19): qty 1

## 2020-10-27 NOTE — Progress Notes (Signed)
Physical Therapy Treatment Patient Details Name: Charlene [name] MRN: [id number] DOB: August 19, 1965 Today's Date: 10/27/2020    History of Present Illness Pt is 55 y/o female presents to [facility] on 2/9 with L sided weakness and headaches. PMH includes anxiety, HTN. 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 s/p Right frontal ventricular catheter placement on 2/10, stopped functioning on 2/11.  MRI 2/11 shows right greater than left mesial temporal lobes / parahippocampal cortex, bilateral basal ganglia, and possibly the upper pons that are concerning for acute infarcts, most likely secondary to mass effect from the hemorrhage. Pt with trach and PEG on 09/10/20. Hospital course complicated by persistent fever, Serratia pneumonia and now pre-orbital cellulitis.    PT Comments    Pt with no functional movement noted this session. Pt not following any commands or tracking therapist around room. Continue to feel that it would be beneficial for the pt to be positioned in a room where the hallway is on her L side, to encourage L attention. Pt could also benefit from an air mattress prophylactically to decrease risk for pressure injuries in conjunction with her turn schedule. Discussed with RN who agreed and nurse secretary to order. Acute PT continues to recommend SNF at d/c as she is currently not at a level to tolerate the increased intensity of CIR level therapies, although this appears to be family's first choice. Will continue to follow and progress as able per POC.    Follow Up Recommendations  SNF;Supervision/Assistance - 24 hour     Equipment Recommendations  Hospital bed; [facility] Lift   Recommendations for Other Services       Precautions / Restrictions Precautions Precautions: Fall Precaution Comments: peg and trach Restrictions Weight Bearing  Restrictions: No    Mobility  Bed Mobility Overal bed mobility: Needs Assistance Bed Mobility: Rolling Rolling: Total assist;+2 for physical assistance;+2 for safety/equipment         General bed mobility comments: +2 total assist for all aspects of bed mobility, positioning, and peri-care. Maximove sling placed in prep to transition to chair.    Transfers Overall transfer level: Needs assistance               General transfer comment: total assist +2 safety using maximove to recliner  Ambulation/Gait             General Gait Details: unable   Stairs             Wheelchair Mobility    Modified Rankin (Stroke Patients Only) Modified Rankin (Stroke Patients Only) Pre-Morbid Rankin Score: No symptoms Modified Rankin: Severe disability     Balance Overall balance assessment: Needs assistance Sitting-balance support: No upper extremity supported;Feet supported Sitting balance-Leahy Scale: Poor Sitting balance - Comments: Sat EOB x25 mins with pt needing only min A when sitting passively, mod/ max A needed during cervical stretching and UE/LE faciltated motion. Postural control: Posterior lean     Standing balance comment: unable                            Cognition Arousal/Alertness: Awake/alert Behavior During Therapy: Flat affect;Restless Overall Cognitive Status: Difficult to assess  General Comments: Does not track therapist around room; head turn to the R with gaze fixed right. Ongoing nystagmus throughout session.      Exercises Other Exercises Other Exercises: PROM to L UE into abduction and flexion of shoulder, elbow extension and hand extension; slow prolonged stretch with noted increased HR to 130 with elbow extension (recovers with rest)    General Comments General comments (skin integrity, edema, etc.): session focused on command following, tracking      Pertinent  Vitals/Pain Pain Assessment: Faces Faces Pain Scale: Hurts a little bit Pain Location: Mild grimacing with general movement. Pain Descriptors / Indicators: Discomfort Pain Intervention(s): Limited activity within patient's tolerance;Monitored during session;Repositioned    Home Living                      Prior Function            PT Goals (current goals can now be found in the care plan section) Acute Rehab PT Goals Patient Stated Goal: pt's family wants her to go to intense rehab PT Goal Formulation: Patient unable to participate in goal setting Time For Goal Achievement: 11/03/20 Potential to Achieve Goals: Poor Progress towards PT goals: Progressing toward goals    Frequency    Min 1X/week      PT Plan Current plan remains appropriate    Co-evaluation              AM-PAC PT "6 Clicks" Mobility   Outcome Measure  Help needed turning from your back to your side while in a flat bed without using bedrails?: Total Help needed moving from lying on your back to sitting on the side of a flat bed without using bedrails?: Total Help needed moving to and from a bed to a chair (including a wheelchair)?: Total Help needed standing up from a chair using your arms (e.g., wheelchair or bedside chair)?: Total Help needed to walk in hospital room?: Total Help needed climbing 3-5 steps with a railing? : Total 6 Click Score: 6    End of Session Equipment Utilized During Treatment: Oxygen Activity Tolerance: Patient tolerated treatment well Patient left: with call bell/phone within reach;in chair;with chair alarm set (posey alarm belt) Nurse Communication: Mobility status;Need for lift equipment PT Visit Diagnosis: Hemiplegia and hemiparesis;Other symptoms and signs involving the nervous system (R29.898);Other abnormalities of gait and mobility (R26.89) Hemiplegia - Right/Left: Right Hemiplegia - dominant/non-dominant: Dominant Hemiplegia - caused by: Cerebral  infarction;Nontraumatic intracerebral hemorrhage;Other Nontraumatic intracranial hemorrhage     Time: 1145-1230 PT Time Calculation (min) (ACUTE ONLY): 45 min  Charges:  $Therapeutic Activity: 38-52 mins                     Charlene [name], PT, DPT Acute Rehabilitation Services Pager: [phone] Office: [phone]    Charlene [name] 10/27/2020, 1:07 PM

## 2020-10-27 NOTE — Plan of Care (Signed)
  Problem: Education: Goal: Knowledge of disease or condition will improve Outcome: Not Progressing Goal: Knowledge of secondary prevention will improve Outcome: Not Progressing Goal: Knowledge of patient specific risk factors addressed and post discharge goals established will improve Outcome: Not Progressing   Problem: Coping: Goal: Will verbalize positive feelings about self Outcome: Not Progressing Goal: Will identify appropriate support needs Outcome: Not Progressing   Problem: Health Behavior/Discharge Planning: Goal: Ability to manage health-related needs will improve Outcome: Not Progressing   Problem: Self-Care: Goal: Ability to participate in self-care as condition permits will improve Outcome: Not Progressing Goal: Verbalization of feelings and concerns over difficulty with self-care will improve Outcome: Not Progressing Goal: Ability to communicate needs accurately will improve Outcome: Not Progressing   Problem: Nutrition: Goal: Risk of aspiration will decrease Outcome: Progressing   Problem: Intracerebral Hemorrhage Tissue Perfusion: Goal: Complications of Intracerebral Hemorrhage will be minimized Outcome: Progressing   Problem: Education: Goal: Knowledge of General Education information will improve Description: Including pain rating scale, medication(s)/side effects and non-pharmacologic comfort measures Outcome: Not Progressing   Problem: Health Behavior/Discharge Planning: Goal: Ability to manage health-related needs will improve Outcome: Not Progressing   Problem: Clinical Measurements: Goal: Ability to maintain clinical measurements within normal limits will improve Outcome: Not Progressing Goal: Will remain free from infection Outcome: Progressing Goal: Diagnostic test results will improve Outcome: Not Progressing Goal: Respiratory complications will improve Outcome: Progressing Goal: Cardiovascular complication will be avoided Outcome:  Progressing   Problem: Nutrition: Goal: Adequate nutrition will be maintained Outcome: Completed/Met   Problem: Coping: Goal: Level of anxiety will decrease Outcome: Progressing   Problem: Elimination: Goal: Will not experience complications related to bowel motility Outcome: Not Progressing Goal: Will not experience complications related to urinary retention Outcome: Not Progressing   Problem: Pain Managment: Goal: General experience of comfort will improve Outcome: Not Progressing   Problem: Skin Integrity: Goal: Risk for impaired skin integrity will decrease Outcome: Not Progressing

## 2020-10-27 NOTE — Progress Notes (Addendum)
[facility] PROGRESS NOTE  Charlene [name] TIR:[id number] DOB: 07-04-1966 DOA: 08/26/2020 PCP: Patient, [name] (Inactive)    October 08, 2020     10/16/2020                        10/16/2020     10/18/2020                        10/20/2020    Status: Remains inpatient appropriate because:Altered mental status, Unsafe d/c plan, IV treatments appropriate due to intensity of illness or inability to take PO and Inpatient level of care appropriate due to severity of illness   Dispo: The patient is from: Home              Anticipated d/c is to: SNF vs brain injury-family does not wish to place patient in SNF.  If this is the only option they would like to consider taking the patient home although this may be a difficult undertaking given current requirement for trach and understandable fears about managing such complex patient in the home environment.  They have been updated as to why this patient currently is not a candidate for aggressive rehabilitative therapies like would be provided at a brain injury recovery rehab.               Patient currently is not medically stable to d/c.   Difficult to place patient Yes   Level of care: Progressive  Code Status: Full Family Communication:  extensive conversation via telephone with [name] [phone]) at bedside on 4/1.; [name] [phone]) is her POA and legal contact.  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 Subsequently on 2/10, she developed  obtundation and respiratory arrest leading to intubation. She has remained persistently obtunded, now has a trach and a PEG. Hospital course complicated by persistent fever,Serratia pneumoniaand now pre-orbital cellulitis  As of 3/25 with adjustments and hypertonicity medications patient seems to be improving.  She is also waking up more although limited interaction but both eyes are now open and she tracks with the left eye.  Multiple discussions with husband who has confirmed he has not delegated other family members to be POA and he is the primary legal contact for patient.  He has agreed to let patient's daughter take the lead on completing and following up on Medicaid and disability applications including having these applications sent to her address.  2/09 Admit for [place] 2/10 EVD placed, later intubated due to obtundation and respiratory arrest 2/11 MRI Brain, hypertonic saline d/c'd, EVD not working. Husband did not consent to PICC  2/13 EVD removed 2/17 [place] discussion with family, PMT 2/24 Bedside trach and PEG 3/1 Transferred to [facility]  Subjective: Eyes closed and appears to be sleepy today.  Objective: Vitals:   10/27/20 0325 10/27/20 0349  BP: (!) 139/91 (!) 161/102  Pulse: 90 93  Resp: 19 19  Temp: 98 F (36.7 C)   SpO2: 100% 100%    Intake/Output  Summary (Last 24 hours) at 10/27/2020 0744 Last data filed at 10/27/2020 0420 Gross per 24 hour  Intake 100 ml  Output 1000 ml  Net -900 ml   Filed Weights   10/24/20 0342 10/25/20 0500 10/27/20 0349  Weight: 68.3 kg 68.9 kg 67 kg    Exam:  Constitutional: Appears to be comfortable, sleeping Respiratory: #6.0 cuffless trach, trach collar with FiO2 21% at 5L,O2 sats 97 to 99%.  Anterior lung sounds clear.  No increased work of breathing or tachypnea Cardiovascular: S1-S2, subtle intermittent tachycardia.  No peripheral edema. Abdomen:  PEG tube, normoactive bowel sounds.  Tube feeding.  LBM 4/11 Musculoskeletal: Marked  decrease in torticollis with associated left neck deviation markedly improved.  Previous hypertonicity significantly improved without any rebound spasticity with PROM Neurologic: CN 2-12 with abnormalities as follows: Blink reflex is present bilaterally although still has some medial disconjugate gaze involving the left eye. Lower extremity foot splints in place.  Able to track with right eye partially with stopping noted around mid field/midline gaze-please see OT notes from 4/7-patient has spontaneous purposeful movement that is gross motor in nature of right upper extremity.   Psych: Sleeping so unable to assess.  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 Intraventricular penetration with blood filling the 3rd and forth ventricles and nearly filling the right lateral ventricle.  -MRI 2/11 with intraparenchymal hemorrhage centered within the right thalamus and basal ganglia extending into the right midbrain with intraventricular extension. Effacement of prepontine cistern and similar 4 mm of leftward midline shift. Separate areas of ristricted diffusion and edema involving the right greater than left mesial temporal lobes / parahippocampal cortex, bilateral basal ganglia, and possibly the upper pons that are concerning for acute infarcts, most likely 2/2 mass effect from the hemorrhage (see report)/ Per neuro "scattered b/l hippocampus, posterior CC, bilateral thalamus, and upper pons, likely due to deep penetrating vessel compression by focal mass-effect".  -2/19 MRI with acute to early subacute infarcts in the bilateral cerebral white matter and right cerebellum, new from 2/11 - interval evolution of multiple other  infarcts present on prior MRI, increased hemorrhage along old right EVD tract (see report) - 2/21 head CT with expected evolution of ICH since 2/13 - Echo with normal EF, no intracardiac source of embolism - CTA head/neck without intracranial arterial occlusion or high grade stenosis, no dissection, aneurysm, or hemodynamically significant stenosis of the carotid or vertebral arteries - Head CT 3/9 with expected evolution of hemorrhage involving the right thalamus, basal ganglia, and adjacent white matter from prior exam with developing encephalomalacia.  Resolved previous intraventricular hemorrhage.  Persistent sulcal effacement with mild improvement.  Persistent 3 mm left to right midline shift.  Multiple areas of low attenuation consistent with infarcts within the central white matter, unchanged in distribution from prior.  (see report) - Per neurology- see last follow up note from 2/25 - [place] likely 2/2 hypertension and nonadherence to meds  - s/p trach/peg on 2/24  - no antithrombotic 2/2 ICH -Obstructive hydrocephalus -> s/p external ventricular catheter placement on 08/27/20 by neurosurgery - EVD clamped on 2/12 and d/c'd on 2/13 -EEG suggestive of cortical dysfunction in right hemisphere likely 2/2 underlying bleed as well as severe diffuse encephalopathy, nonspecific etiology . -Continue baclofen 5 mg at 8 AM  and 4 PM and 7.5 mg at bedtime.  LFTs remain stable with baseline subtle elevation without change since initiation of baclofen. -Continue Provigil 200 mg daily as recommended by PMR team -3/22 EEG no seizures and persistent encephalopathy -Prognosis remains guarded and it is highly doubtful she will return to previous level of functioning therefore financial counseling notified to pursue disability application -Add scheduled Senokot at HS given requirement for [facility] -Per PT and OT notes dated 4/5 patient not able to participate with therapies.  Likely will sign off soon but can be  reconsulted if patient is noted with improvement -Begin out of bed to chair daily and PROM every 4 hours per nursing staff -Per OT recommendations have made staff aware that patient needs to move to outside of the hall to improve visual-spatial interaction and acuity.  Enterobacter UTI -> 100,000 colonies-sensitivities pending -4/12 DC Rocephin and begin Meropenem until sensitivities available  Tachycardia/suboptimal BP -Was given 1L of NS and vfree water increased fom 100 c q 4 hrs to 200 cc  -HR and BP improved -Norvasc was decreased to 2.5 mg 4/8  Neck Contracture/torticollis -Preference for keeping head turned to the left. -Continue baclofen as above -- continue PT/OT weekly per their recommendations given patient's inability to effectively participate at this juncture.  Nursing staff performing PROM activities.  Family also instructed and has been performing intermittently. PT eval 4/5   Pt received supine in bed, eyes open. Maximove used to transfer pt bed to recliner. Pt tolerated this well with stable HR, O2 sats, and BP but did not engage with this any more than she has when working in supine or seated EOB. Mother present and education given on positioning in the seated position. Pt not demonstrating volitional mvmt or following any commands. We are reaching the point where pt will not longer be appropriate for acute PT due to her status not changing with stimulation and family training on ROM and positioning being close to done. New goals set for next 2 weeks but expect to sign off at that point.    OT eval 4/7:   Patient supine in bed, session focus on distinguishing patients visual input and command following.  In a quiet environment with no distractions, patient appears to track R eye 1/3 of the way to midline (up to 50% by end of session) towards L when given min to mod cueing and fixating on therapist approx 50% of the time (3/6 trials) for 1 second.  Also, given 1 step simple  command, mod cueing (using her name before cue) and up to 3 minute delay (with R hand in optimal position- wrist supported) pt able to move R thumb. With seeing what appears to be minimal visual tracking, fixating, and thumb movement volitionally (although it is still hard to determine vs spasticity at times), OT will give patient a trial of 2x/week visits to focus on and strengthen these skills in hopes to allow to progress towards basic communication of distinguishing between 2 items or pictures (yes/no) or strengthening "thumbs up". Plan to occlude L eye with nasal occlusion glasses to strengthen R eye. Also discussed with RN option of moving patient to opposite side of hallway to provide more stimulation/input towards L side for encouragement of patient to scan towards midline with R eye. Updated goals.      -OT would also like to have patient placed in a different room across the hallway which would allow for optimal visualization using right eye based on positioning  in bed and said room.  Hypertensive emergency/Malignant HTN Resolved -Home medications include Coreg and HCTZ which have not been reordered -On 4/7 BP was trending upward therefore Norvasc increased from 5 mg to 10 mg.  As of 4/8 BP now softer/suboptimal therefore have opted to hold doses today, decrease Norvasc 2.5 mg and decrease metoprolol from 125 mg to 100 mg twice daily and follow trends. -Continue to treat underlying causes such as pain.  Acute hypoxemic respiratory failure secondary to CVA requiring mechanical ventilation and tracheostomy tube -Stable on trach collar and has been transitioned to a #6 cuffless trach -Has significant altered mentation and inability to manage oral secretions at present time is not a safe candidate for decannulation.  Discussed this with family as major obstacle in discharging to home.  This will also decrease options for SNF placement. -Appreciate trach team assistance in management -See above  regarding evaluation for recurrent low-grade fevers.  Pain likely related to underlying hypertonicity -Morphine IV, oxycodone and Tylenol prn (watch elev LFTs) -Treat underlying hypertonicity with skeletal muscle relaxers and uptitrate as tolerated with goal to improve and resolve hypertonicity without contributing to oversedation  Dysphagia/Nutrition Status: Nutrition Problem: Inadequate oral intake Etiology: inability to eat Signs/Symptoms: NPO status Interventions: Tube feeding consisting of Osmolite 1.2 Cal at 55 cc/h; continue Prosource liquid 45 cc daily, free water 100 cc every 4 hours per tube; will also continue to follow CBGs regularly while on tube feedings and provide SSI Estimated body mass index is 24.58 kg/m as calculated from the following:   Height as of this encounter: _0  (1.651 m).   Weight as of this encounter: 67 kg. If patient eventually discharged to home to teach family how to bolus feed as opposed to utilizing continuous tube feeding infusions.   Other problems: Anasarca -bilateral upper extremity edema, minimal LE edema - suspect related to prolonged hospitalization, hypoalbuminemia -upper extremity US-> age indeterminate SVT of right cephalic vein  Elevated LFT's - Remain elevated but stable without any obstructive pattern -Continue baclofen and statin  Bacterial Conjunctivitis/periorbital cellulitis - MRI 3/7 orbits with L periorbital cellulitis  -- CT head 3/9 with persistent subtotal opacification of paranasal sinuses and mastoid air cells, bubbly lucency at base of R maxillary sinus (likely odontogenic in origin) - CT maxillofacial 3/12 - improving cellulitis  - s/p erythromycin ointment - completed course of Ancef   Chronic pansinusitis/recurrent fevers 2/2 aspiration PNA;History of Serratia Pneumonia  Hospital Acquired Pneumonia:  -Tracheal aspirate from 2/25 and 3/1 with serratia - cefepime 2/27-3/5; repeat sputum cx 3/21 + serratia (likely  colonization but w/ recurrent fever opted to treat) -Has completed antibiotics recently for bilateral orbital cellulitis -started on Cipro for tracheal aspirate positive for Serratia marcescens   -Repeat sinus CT demonstrates progressive complete bilateral maxillary sinus opacification with unchanged complete left sphenoid sinus opacification and right sphenoid sinus fluid c/w chronic state  -CT Chest 3/30 with aspiration PNA-dtr updated at bedside by attending MD- Cipro changed to Unasyn IV-Unasyn now completed -no increased O2 needs  Anemia -Stable, follow     Data Reviewed: Basic Metabolic Panel: Recent Labs  Lab 10/24/20 0329  NA 140  K 4.1  CL 104  CO2 28  GLUCOSE 137*  BUN 19  CREATININE 0.60  CALCIUM 8.8*   Liver Function Tests: No results for input(s): AST, ALT, ALKPHOS, BILITOT, PROT, ALBUMIN in the last 168 hours. No results for input(s): LIPASE, AMYLASE in the last 168 hours. No results for input(s): AMMONIA in the  last 168 hours. CBC: Recent Labs  Lab 10/22/20 1139 10/24/20 0329  WBC 11.3* 7.2  HGB 9.8* 8.4*  HCT 31.2* 26.4*  MCV 82.1 82.5  PLT 390 351   Cardiac Enzymes: No results for input(s): CKTOTAL, CKMB, CKMBINDEX, TROPONINI in the last 168 hours. BNP (last 3 results) No results for input(s): BNP in the last 8760 hours.  ProBNP (last 3 results) No results for input(s): PROBNP in the last 8760 hours.  CBG: Recent Labs  Lab 10/26/20 1617 10/26/20 1932 10/26/20 2357 10/27/20 0325 10/27/20 0733  GLUCAP 147* 97 133* 131* 114*    Recent Results (from the past 240 hour(s))  Culture, Urine     Status: Abnormal (Preliminary result)   Collection Time: 10/23/20  1:20 AM   Specimen: Urine, Catheterized  Result Value Ref Range Status   Specimen Description URINE, CATHETERIZED  Final   Special Requests NONE  Final   Culture (A)  Final    >=100,000 COLONIES/mL ENTEROBACTER CLOACAE REPEATING SUSCEPTIBILITY Performed at [facility],  [address], [place], [place] [address]    Report Status PENDING  Incomplete     Studies: No results found.  Scheduled Meds: . amLODipine  2.5 mg Per Tube Daily  . atorvastatin  40 mg Per Tube Daily  . baclofen  5 mg Per Tube 2 times per day  . baclofen  7.5 mg Per Tube Daily  . chlorhexidine gluconate (MEDLINE KIT)  15 mL Mouth Rinse BID  . Chlorhexidine Gluconate Cloth  6 each Topical Daily  . enoxaparin (LOVENOX) injection  40 mg Subcutaneous Q24H  . feeding supplement (PROSource TF)  45 mL Per Tube Daily  . free water  100 mL Per Tube Q4H  . hydrALAZINE  50 mg Per Tube Q8H  . insulin aspart  0-9 Units Subcutaneous Q4H  . mouth rinse  15 mL Mouth Rinse 5 X Daily  . metoprolol tartrate  100 mg Per Tube BID  . modafinil  200 mg Per Tube Daily  . pantoprazole sodium  40 mg Per Tube BID  . scopolamine  1 patch Transdermal Q72H  . sennosides  5 mL Per Tube QHS   Continuous Infusions: . sodium chloride Stopped (10/26/20 1447)  . cefTRIAXone (ROCEPHIN)  IV 1 g (10/26/20 1357)  . feeding supplement (OSMOLITE 1.2 CAL) 1,000 mL (10/26/20 1911)    Principal Problem:   ICH (intracerebral hemorrhage) (HCC) Active Problems:   Intraparenchymal hemorrhage of brain (HCC)   Acute hypoxemic respiratory failure (HCC)   Fever   Endotracheal tube present   Status post tracheostomy ([place])   Palliative care by specialist   Muscle hypertonicity   Oropharyngeal dysphagia   Brain injury with loss of consciousness ([place])   Poor prognosis   Aspiration pneumonia of right lower lobe (HCC)   Gram-negative infection   Abnormal urinalysis   Consultants:  Palliative care  Pulm  Neurology   Procedures:  Tracheostomy  PEG tube  EEG  Echocardiogram  Antibiotics: Anti-infectives (From admission, onward)   Start     Dose/Rate Route Frequency Ordered Stop   10/26/20 1145  cefTRIAXone (ROCEPHIN) 1 g in sodium chloride 0.9 % 100 mL IVPB        1 g 200 mL/hr over 30 Minutes Intravenous  Every 24 hours 10/26/20 1056 11/02/20 1144   10/14/20 1815  Ampicillin-Sulbactam (UNASYN) 3 g in sodium chloride 0.9 % 100 mL IVPB        3 g 200 mL/hr over 30 Minutes Intravenous  Every 6 hours 10/14/20 1754 10/19/20 1340   10/08/20 0845  ciprofloxacin (CIPRO) tablet 500 mg  Status:  Discontinued        500 mg Per Tube 2 times daily 10/08/20 0754 10/14/20 1731   09/21/20 1545  ceFAZolin (ANCEF) IVPB 1 g/50 mL premix        1 g 100 mL/hr over 30 Minutes Intravenous Every 8 hours 09/21/20 1457 09/28/20 0647   09/20/20 0600  ceFAZolin (ANCEF) IVPB 1 g/50 mL premix  Status:  Discontinued        1 g 100 mL/hr over 30 Minutes Intravenous Every 8 hours 09/14/20 0903 09/14/20 0904   09/16/20 1300  vancomycin (VANCOREADY) IVPB 1250 mg/250 mL  Status:  Discontinued        1,250 mg 166.7 mL/hr over 90 Minutes Intravenous Every 24 hours 09/15/20 1232 09/17/20 0956   09/15/20 1230  vancomycin (VANCOREADY) IVPB 1250 mg/250 mL        1,250 mg 166.7 mL/hr over 90 Minutes Intravenous  Once 09/15/20 1131 09/15/20 1402   09/13/20 1400  ceFEPIme (MAXIPIME) 2 g in sodium chloride 0.9 % 100 mL IVPB        2 g 200 mL/hr over 30 Minutes Intravenous Every 8 hours 09/13/20 1341 09/19/20 2208   09/06/20 0600  vancomycin (VANCOREADY) IVPB 1250 mg/250 mL        1,250 mg 166.7 mL/hr over 90 Minutes Intravenous  Once 09/06/20 0541 09/06/20 0739   09/05/20 1400  ceFAZolin (ANCEF) IVPB 1 g/50 mL premix  Status:  Discontinued        1 g 100 mL/hr over 30 Minutes Intravenous Every 8 hours 09/05/20 1120 09/13/20 1341   08/30/20 0600  vancomycin (VANCOREADY) IVPB 500 mg/100 mL  Status:  Discontinued        500 mg 100 mL/hr over 60 Minutes Intravenous Every 12 hours 08/29/20 1554 09/01/20 1115   08/29/20 1700  ceFEPIme (MAXIPIME) 2 g in sodium chloride 0.9 % 100 mL IVPB  Status:  Discontinued        2 g 200 mL/hr over 30 Minutes Intravenous Every 12 hours 08/29/20 1554 09/01/20 1115   08/29/20 1645  vancomycin  (VANCOREADY) IVPB 1250 mg/250 mL        1,250 mg 166.7 mL/hr over 90 Minutes Intravenous  Once 08/29/20 1554 08/29/20 1820       Time spent: 25 minutes    Erin Hearing ANP  [facility] 7 am - 330 pm/M-F for direct patient care and secure chat Please refer to Amion for contact info 61  days

## 2020-10-27 NOTE — TOC Progression Note (Addendum)
Transition of Care [facility]) - Progression Note    Patient Details  Name: Charlene [name] MRN: [id number] Date of Birth: 24-Apr-1966  Transition of Care [facility]) CM/SW Contact  Janae Bridgeman, RN Phone Number: 10/27/2020, 2:24 PM  Clinical Narrative:    Case management called and left a message with Nemiah Commander, CM at [facility] to inquire if patient was appropriate for placement at their facility.  [name] and extended family prefers to take the patient home with home health services versus SNF placement.  The patient is currently not medically ready for discharge, but I will offer choice to the patient's family if [facility] Is open to offering the patient a bed at the facility with LOG from [facility].  Clinicals were emailed to Mariel Craft, CM at [facility] and rehab to [email or web address].  10/27/2020 1508 - I called and spoke with Alison Murray, spouse on the phone and updated him on the patient and possible options for transitions of care in regards to rehabilitation placement.  The patient's spouse was open to exploring options as long as the facility was acceptable rehab facility and not too far away from family for visitation with the patient.  I explained [facility] and rehab and he was open to information concerning this facility.  I emailed an information flyer to the patient's husband at [email or web address].  I also discussed [place], [employer] and the patient's husband was not open for transfer to this facility at this time if bed available since the distance was too far away from the patient's spouse and children.  CM and MSW will continue to explore transitions of care needs.   Expected Discharge Plan: Home w Home Health Services Barriers to Discharge: Continued Medical Work up  Expected Discharge Plan and Services Expected Discharge Plan: Home w Home Health  Services In-house Referral: Clinical Social Work Discharge Planning Services: CM Consult   Living arrangements for the past 2 months: Single Family Home                                       Social Determinants of Health (SDOH) Interventions    Readmission Risk Interventions Readmission Risk Prevention Plan 09/29/2020  Transportation Screening Complete  PCP or Specialist Appt within 3-5 Days Complete  HRI or Home Care Consult Complete  Social Work Consult for Recovery Care Planning/Counseling Complete  Palliative Care Screening Complete  Medication Review [employer]) Complete  Some recent data might be hidden

## 2020-10-27 NOTE — Progress Notes (Signed)
Occupational Therapy Treatment Patient Details Name: Charlene [name] MRN: [id number] DOB: 10-07-1965 Today's Date: 10/27/2020    History of present illness Pt is 55 yo female presents to [facility] on 2/9 with L sided weakness and headaches. PMH includes anxiety, HTN. 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 s/p Right frontal ventricular catheter placement on 2/10, stopped functioning on 2/11.  MRI 2/11 shows right greater than left mesial temporal lobes / parahippocampal cortex, bilateral basal ganglia, and possibly the upper pons that are concerning for acute infarcts, most likely secondary to mass effect from the hemorrhage. ETT 2/10-present.  Pt with trach and PEG on 09/10/20. Hospital course complicated by persistent fever, Serratia pneumonia and now pre-orbital cellulitis.   OT comments  OT session focus on command following and tracking with R eye.  Therapist covered L eye to promote increased focus on R eye, noted able to spontaneously bring R eye to midline but not past and decreased tracking with R eye compared to last session.  Max cueing, optimal position of R UE and greatly increased time (4-5 minutes), patient appears to move R thumb on 1/5 trials.  Noted patient much more restless today, moving R UE continuously during session. PROM to L UE (especially into extension of elbow) caused HR to increase and appears uncomfortable, limited PROM completed today; left L UE extended on pillow.  Will follow up for soft elbow splint.    Educated mother on OT role, how it is still uncertain which movements are purposeful.  Encouraged family to bring in meaningful objects that OT can use in tracking.  Will follow up with nursing to possibly move rooms to increase stimulation to L side of environment.    Follow Up Recommendations  SNF;Supervision/Assistance - 24 hour     Equipment Recommendations  Hospital bed;Wheelchair cushion (measurements OT);Wheelchair (measurements OT);Other (comment)    Recommendations for Other Services      Precautions / Restrictions Precautions Precautions: Fall Precaution Comments: peg and trach       Mobility Bed Mobility                    Transfers                      Balance                                           ADL either performed or assessed with clinical judgement   ADL                                         General ADL Comments: total A     Vision   Additional Comments: Pt remains with R gaze, able to focus on thearpist and track briefly towards midline but less than previous session. Spontanesouly brings R eye to midline briefly. L eye remains adduction and downward.  Covered L eye during session to optimize use of R eye.   Perception     Praxis      Cognition Arousal/Alertness: Awake/alert Behavior During Therapy: Flat affect;Restless Overall Cognitive Status: Difficult to assess  General Comments: with decreased visual tracking today, increased tone and appears more restless.  Requires max cueing for simple 1 step command (move thumb/thumbs up) with UE in optimal position with ability to complete on 1/5 trials given 4-5 minutes        Exercises Exercises: Other exercises Other Exercises Other Exercises: PROM to L UE into abduction and flexion of shoulder, elbow extension and hand extension; slow prolonged stretch with noted increased HR to 130 with elbow extension (recovers with rest)   Shoulder Instructions       General Comments session focused on command following, tracking    Pertinent Vitals/ Pain       Pain Assessment: Faces Faces Pain Scale: Hurts a little bit Pain Location: elevated HR with L UE PROM Pain Descriptors / Indicators: Discomfort Pain Intervention(s):  Limited activity within patient's tolerance;Monitored during session;Repositioned  Home Living                                          Prior Functioning/Environment              Frequency  Min 2X/week        Progress Toward Goals  OT Goals(current goals can now be found in the care plan section)  Progress towards OT goals: Progressing toward goals  Acute Rehab OT Goals Patient Stated Goal: pt's family wants her to go to intense rehab OT Goal Formulation: Patient unable to participate in goal setting  Plan Discharge plan remains appropriate;Frequency remains appropriate    Co-evaluation                 AM-PAC OT "6 Clicks" Daily Activity     Outcome Measure   Help from another person eating meals?: Total Help from another person taking care of personal grooming?: Total Help from another person toileting, which includes using toliet, bedpan, or urinal?: Total Help from another person bathing (including washing, rinsing, drying)?: Total Help from another person to put on and taking off regular upper body clothing?: Total Help from another person to put on and taking off regular lower body clothing?: Total 6 Click Score: 6    End of Session    OT Visit Diagnosis: Other symptoms and signs involving the nervous system (R29.898);Other symptoms and signs involving cognitive function;Other (comment) Symptoms and signs involving cognitive functions: Other Nontraumatic ICH 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

## 2020-10-28 DIAGNOSIS — N3000 Acute cystitis without hematuria: Secondary | ICD-10-CM | POA: Insufficient documentation

## 2020-10-28 LAB — GLUCOSE, CAPILLARY
Glucose-Capillary: 114 mg/dL — ABNORMAL HIGH (ref 70–99)
Glucose-Capillary: 117 mg/dL — ABNORMAL HIGH (ref 70–99)
Glucose-Capillary: 124 mg/dL — ABNORMAL HIGH (ref 70–99)
Glucose-Capillary: 125 mg/dL — ABNORMAL HIGH (ref 70–99)
Glucose-Capillary: 132 mg/dL — ABNORMAL HIGH (ref 70–99)
Glucose-Capillary: 139 mg/dL — ABNORMAL HIGH (ref 70–99)

## 2020-10-28 LAB — URINE CULTURE: Culture: >=100000 — AB

## 2020-10-28 MED ORDER — MIRABEGRON ER 25 MG PO TB24
25.0000 mg | ORAL_TABLET | Freq: Every day | ORAL | Status: DC
  Administered 2020-10-28 – 2020-11-04 (×8): 25 mg via ORAL
  Filled 2020-10-28 (×9): qty 1

## 2020-10-28 NOTE — TOC Progression Note (Addendum)
Transition of Care [facility]) - Progression Note    Patient Details  Name: MYSTY KIELTY MRN: [id number] Date of Birth: March 19, 1966  Transition of Care [facility]) CM/SW Contact  Janae Bridgeman, RN Phone Number: 10/28/2020, 8:42 AM  Clinical Narrative:    Case management faxed the patient's clinicals out in the hub to skilled nursing facilities in the area to explore options for rehabilitation and care for the patient.  After speaking with the patient's husband yesterday, the family continues to prefer to take patient home versus placement in a SNF facility for care, but husband is open to exploring options for care in the meantime.    CM and MSW will continue to explore transitions of care options for the patient.  10/28/2020 1100 - Edwin Dada, MSW spoke with Victorino Dike, CM at [facility] and she will review the patient for possible consideration for admission.  CM spoke with Mariel Craft, CM at [facility] and the facility is willing to place the patient on a waiting list for admission.  I will speak to the family and ask that they tour the facility for possible placement until patient is safe to go home with home health services and family support.  10/28/2020 1155 - I called and spoke to Alison Murray, the patient's husband and updated him that [facility] was willing to place the patient on a waiting list for admission to their facility.  I encouraged the patient's husband to visit the facility in person for a tour and available care for the patient.  [facility] and [address] Is open for family visitation 7 days a week from 9 am til 5 pm and the husband is aware and will speak to the other family members as well.  Edwin Dada, MSW reached out to Cassell Clement, CM at [facility] to explore possible charity admission to the facility for care.  The facility will check with leadership about  availability of charity bed for patient and will follow up with DTP team.  CM and MSW will continue to follow the patient for transitions of care needs.   Expected Discharge Plan: Home w Home Health Services Barriers to Discharge: Continued Medical Work up  Expected Discharge Plan and Services Expected Discharge Plan: Home w Home Health Services In-house Referral: Clinical Social Work Discharge Planning Services: CM Consult   Living arrangements for the past 2 months: Single Family Home                                       Social Determinants of Health (SDOH) Interventions    Readmission Risk Interventions Readmission Risk Prevention Plan 09/29/2020  Transportation Screening Complete  PCP or Specialist Appt within 3-5 Days Complete  HRI or Home Care Consult Complete  Social Work Consult for Recovery Care Planning/Counseling Complete  Palliative Care Screening Complete  Medication Review Oceanographer) Complete  Some recent data might be hidden

## 2020-10-28 NOTE — Plan of Care (Signed)
  Problem: Clinical Measurements: Goal: Ability to maintain clinical measurements within normal limits will improve Outcome: Progressing Goal: Will remain free from infection Outcome: Progressing Goal: Respiratory complications will improve Outcome: Progressing Goal: Cardiovascular complication will be avoided Outcome: Progressing   Problem: Elimination: Goal: Will not experience complications related to urinary retention Outcome: Progressing   Problem: Skin Integrity: Goal: Risk for impaired skin integrity will decrease Outcome: Progressing   Problem: Education: Goal: Knowledge of disease or condition will improve Outcome: Not Progressing Goal: Knowledge of secondary prevention will improve Outcome: Not Progressing Goal: Knowledge of patient specific risk factors addressed and post discharge goals established will improve Outcome: Not Progressing   Problem: Coping: Goal: Will verbalize positive feelings about self Outcome: Not Progressing Goal: Will identify appropriate support needs Outcome: Not Progressing   Problem: Health Behavior/Discharge Planning: Goal: Ability to manage health-related needs will improve Outcome: Not Progressing   Problem: Self-Care: Goal: Ability to participate in self-care as condition permits will improve Outcome: Not Progressing Goal: Verbalization of feelings and concerns over difficulty with self-care will improve Outcome: Not Progressing Goal: Ability to communicate needs accurately will improve Outcome: Not Progressing   Problem: Nutrition: Goal: Risk of aspiration will decrease Outcome: Not Progressing

## 2020-10-28 NOTE — Progress Notes (Addendum)
[facility] PROGRESS NOTE  Charlene [name] YYQ:[id number] DOB: [birth year] DOA: 08/26/2020 PCP: Patient, [name] (Inactive)    October 08, 2020     10/16/2020                        10/16/2020     10/18/2020                        10/20/2020    Status: Remains inpatient appropriate because:Altered mental status, Unsafe d/c plan, IV treatments appropriate due to intensity of illness or inability to take PO and Inpatient level of care appropriate due to severity of illness   Dispo: The patient is from: Home              Anticipated d/c is to: SNF vs brain injury-family does not wish to place patient in SNF.  If this is the only option they would like to consider taking the patient home although this may be a difficult undertaking given current requirement for trach and understandable fears about managing such complex patient in the home environment.  They have been updated as to why this patient currently is not a candidate for aggressive rehabilitative therapies like would be provided at a brain injury recovery rehab.               Patient currently is not medically stable to d/c.   Difficult to place patient Yes   Level of care: Progressive  Code Status: Full Family Communication:  extensive conversation via telephone with [name] [phone]) at bedside on 4/1.; [name] [phone]) is her POA and legal contact.  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 Subsequently on 2/10, she developed obtundation and respiratory arrest leading to intubation. She has remained persistently obtunded, now has a trach and a PEG. Hospital course complicated by persistent fever,Serratia pneumoniaand now pre-orbital cellulitis  As of 3/25 with adjustments and hypertonicity medications patient seems to be improving.  She is also waking up more although limited interaction but both eyes are now open and she tracks with the left eye.  Multiple discussions with husband who has confirmed he has not delegated other family members to be POA and he is the primary legal contact for patient.  He has agreed to let patient's daughter take the lead on completing and following up on Medicaid and disability applications including having these applications sent to her address.  2/09 Admit for [place] 2/10 EVD placed, later intubated due to obtundation and respiratory arrest 2/11 MRI Brain, hypertonic saline d/c'd, EVD not working. Husband did not consent to PICC  2/13 EVD removed 2/17 [place] discussion with family, PMT 2/24 Bedside trach and PEG 3/1 Transferred to [facility]  Subjective: Mother at bedside.  Patient awake and appears to be alert and tracking with right eye.  Appear to have more uncontrolled?  Purposeful movement and fidgety restless once I entered the room and began  talking to her.  Unfortunately unable to get patient to follow simple commands.  Objective: Vitals:   10/28/20 0511 10/28/20 0726  BP: 110/72 128/87  Pulse:  85  Resp:  16  Temp:  98.5 F (36.9 C)  SpO2:  98%    Intake/Output Summary (Last 24 hours) at 10/28/2020 0755 Last data filed at 10/28/2020 0600 Gross per 24 hour  Intake 808.1 ml  Output 1000 ml  Net -191.9 ml   Filed Weights   10/24/20 0342 10/25/20 0500 10/27/20 0349  Weight: 68.3 kg 68.9 kg 67 kg    Exam:  Constitutional: Alert, calm, no acute distress during my evaluation. Respiratory: #6.0  cuffless trach, trach collar with FiO2 21% at 5L,O2 sats 97 to 99%.  Anterior lung sounds are clear.  Room air. Cardiovascular: S1-S2, low-grade tachycardia.  No peripheral edema. Abdomen:  PEG tube, nontender.  Normoactive bowel sounds.  Tube feeding.  LBM 4/11 Musculoskeletal: Marked decrease in torticollis with associated left neck deviation markedly improved.  Previous hypertonicity significantly improved without any rebound spasticity with PROM Neurologic: CN 2-12 with abnormalities as follows: Blink reflex is present bilaterally although still has some medial disconjugate gaze involving the left eye. Lower extremity foot splints in place.  Able to track with right eye partially with stopping noted around mid field/midline gaze-please see OT notes from 4/7-patient has spontaneous purposeful movement that is gross motor in nature of right upper extremity.   Psych: Awake with eyes open.  Appears to be tracking of the right eye but unable to follow simple commands.  Nonverbal so unable to accurately assess orientation.    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 Intraventricular penetration with blood filling the 3rd and forth ventricles and nearly filling the right lateral ventricle.  -MRI 2/11 with intraparenchymal hemorrhage centered within the right thalamus and basal ganglia extending into the right midbrain with intraventricular extension. Effacement of prepontine cistern and similar 4 mm of leftward midline shift. Separate areas of ristricted diffusion and edema involving the right greater than left mesial temporal lobes / parahippocampal cortex, bilateral basal ganglia, and possibly the upper pons that are concerning for acute infarcts, most likely 2/2 mass effect from the  hemorrhage (see report)/ Per neuro "scattered b/l hippocampus, posterior CC, bilateral thalamus, and upper pons, likely due to deep penetrating vessel compression by focal mass-effect".  -2/19 MRI with acute to early subacute infarcts in the bilateral cerebral white matter and right cerebellum, new from 2/11 - interval evolution of multiple other infarcts present on prior MRI, increased hemorrhage along old right EVD tract (see report) - 2/21 head CT with expected evolution of ICH since 2/13 - Echo with normal EF, no intracardiac source of embolism - CTA head/neck without intracranial arterial occlusion or high grade stenosis, no dissection, aneurysm, or hemodynamically significant stenosis of the carotid or vertebral arteries - Head CT 3/9 with expected evolution of hemorrhage involving the right thalamus, basal ganglia, and adjacent white matter from prior exam with developing encephalomalacia.  Resolved previous intraventricular hemorrhage.  Persistent sulcal effacement with mild improvement.  Persistent 3 mm left to right midline shift.  Multiple areas of low attenuation consistent with infarcts within the central white matter, unchanged in distribution from prior.  (see report) - Per neurology- see last follow up note from 2/25 - [place] likely 2/2 hypertension and nonadherence to  meds  - s/p trach/peg on 2/24  - no antithrombotic 2/2 ICH -Obstructive hydrocephalus -> s/p external ventricular catheter placement on 08/27/20 by neurosurgery - EVD clamped on 2/12 and d/c'd on 2/13 -EEG suggestive of cortical dysfunction in right hemisphere likely 2/2 underlying bleed as well as severe diffuse encephalopathy, nonspecific etiology . -Continue baclofen 5 mg at 8 AM and 4 PM and 7.5 mg at bedtime.  LFTs remain stable with baseline subtle elevation without change since initiation of baclofen. -Continue Provigil 200 mg daily as recommended by PMR team -3/22 EEG no seizures and persistent  encephalopathy -Prognosis remains guarded and it is highly doubtful she will return to previous level of functioning therefore financial counseling notified to pursue disability application -Add scheduled Senokot at HS given requirement for [facility] -Per PT and OT notes dated 4/5 patient not able to participate with therapies.  Likely will sign off soon but can be reconsulted if patient is noted with improvement -Begin out of bed to chair daily and PROM every 4 hours per nursing staff -Per OT recommendations have made staff aware that patient needs to move to outside of the hall to improve visual-spatial interaction and acuity.  ESBL positive Enterobacter UTI -> 100,000 colonies-sensitive to gentamicin imipenem, resistant to cefazolin, cefepime Septra and intermediate to Cipro, nitrofurantoin energetically. -Continue meropenem for total of 7 days. -DC Purewick during acute UTI -Contact isolation -Since my initial evaluation nursing has documented patient behaviors that are concerning for possible bladder spasms therefore recommended to administer oxycodone and Myrbetriq also added.  Tachycardia/suboptimal BP -Was given 1L of NS and vfree water increased fom 100 c q 4 hrs to 200 cc  -HR and BP improved -Norvasc was decreased to 2.5 mg 4/8  Neck Contracture/torticollis -Preference for keeping head turned to the left. -Continue baclofen as above -- continue PT/OT weekly per their recommendations given patient's inability to effectively participate at this juncture.  Nursing staff performing PROM activities.  Family also instructed and has been performing intermittently. PT eval 4/5   Pt received supine in bed, eyes open. Maximove used to transfer pt bed to recliner. Pt tolerated this well with stable HR, O2 sats, and BP but did not engage with this any more than she has when working in supine or seated EOB. Mother present and education given on positioning in the seated position. Pt not  demonstrating volitional mvmt or following any commands. We are reaching the point where pt will not longer be appropriate for acute PT due to her status not changing with stimulation and family training on ROM and positioning being close to done. New goals set for next 2 weeks but expect to sign off at that point.    OT eval 4/7:   Patient supine in bed, session focus on distinguishing patients visual input and command following.  In a quiet environment with no distractions, patient appears to track R eye 1/3 of the way to midline (up to 50% by end of session) towards L when given min to mod cueing and fixating on therapist approx 50% of the time (3/6 trials) for 1 second.  Also, given 1 step simple command, mod cueing (using her name before cue) and up to 3 minute delay (with R hand in optimal position- wrist supported) pt able to move R thumb. With seeing what appears to be minimal visual tracking, fixating, and thumb movement volitionally (although it is still hard to determine vs spasticity at times), OT will give patient a trial of 2x/week  visits to focus on and strengthen these skills in hopes to allow to progress towards basic communication of distinguishing between 2 items or pictures (yes/no) or strengthening "thumbs up". Plan to occlude L eye with nasal occlusion glasses to strengthen R eye. Also discussed with RN option of moving patient to opposite side of hallway to provide more stimulation/input towards L side for encouragement of patient to scan towards midline with R eye. Updated goals.      -OT would also like to have patient placed in a different room across the hallway which would allow for optimal visualization using right eye based on positioning in bed and said room.  Hypertensive emergency/Malignant HTN Resolved -Home medications include Coreg and HCTZ which have not been reordered -On 4/7 BP was trending upward therefore Norvasc increased from 5 mg to 10 mg.  As of 4/8 BP now  softer/suboptimal therefore have opted to hold doses today, decrease Norvasc 2.5 mg and decrease metoprolol from 125 mg to 100 mg twice daily and follow trends. -Continue to treat underlying causes such as pain.  Acute hypoxemic respiratory failure secondary to CVA requiring mechanical ventilation and tracheostomy tube -Stable on trach collar and has been transitioned to a #6 cuffless trach -Has significant altered mentation and inability to manage oral secretions at present time is not a safe candidate for decannulation.  Discussed this with family as major obstacle in discharging to home.  This will also decrease options for SNF placement. -Appreciate trach team assistance in management -See above regarding evaluation for recurrent low-grade fevers.  Pain likely related to underlying hypertonicity -Morphine IV, oxycodone and Tylenol prn (watch elev LFTs) -Treat underlying hypertonicity with skeletal muscle relaxers and uptitrate as tolerated with goal to improve and resolve hypertonicity without contributing to oversedation  Dysphagia/Nutrition Status: Nutrition Problem: Inadequate oral intake Etiology: inability to eat Signs/Symptoms: NPO status Interventions: Tube feeding consisting of Osmolite 1.2 Cal at 55 cc/h; continue Prosource liquid 45 cc daily, free water 100 cc every 4 hours per tube; will also continue to follow CBGs regularly while on tube feedings and provide SSI Estimated body mass index is 24.58 kg/m as calculated from the following:   Height as of this encounter: '5\' 5"'  (1.651 m).   Weight as of this encounter: 67 kg. If patient eventually discharged to home to teach family how to bolus feed as opposed to utilizing continuous tube feeding infusions.   Other problems: Anasarca -bilateral upper extremity edema, minimal LE edema - suspect related to prolonged hospitalization, hypoalbuminemia -upper extremity US-> age indeterminate SVT of right cephalic vein  Elevated  LFT's - Remain elevated but stable without any obstructive pattern -Continue baclofen and statin  Bacterial Conjunctivitis/periorbital cellulitis - MRI 3/7 orbits with L periorbital cellulitis  -- CT head 3/9 with persistent subtotal opacification of paranasal sinuses and mastoid air cells, bubbly lucency at base of R maxillary sinus (likely odontogenic in origin) - CT maxillofacial 3/12 - improving cellulitis  - s/p erythromycin ointment - completed course of Ancef   Chronic pansinusitis/recurrent fevers 2/2 aspiration PNA;History of Serratia Pneumonia  Hospital Acquired Pneumonia:  -Tracheal aspirate from 2/25 and 3/1 with serratia - cefepime 2/27-3/5; repeat sputum cx 3/21 + serratia (likely colonization but w/ recurrent fever opted to treat) -Has completed antibiotics recently for bilateral orbital cellulitis -started on Cipro for tracheal aspirate positive for Serratia marcescens   -Repeat sinus CT demonstrates progressive complete bilateral maxillary sinus opacification with unchanged complete left sphenoid sinus opacification and right sphenoid sinus fluid c/w  chronic state  -CT Chest 3/30 with aspiration PNA-dtr updated at bedside by attending MD- Cipro changed to Unasyn IV-Unasyn now completed -no increased O2 needs  Anemia -Stable, follow     Data Reviewed: Basic Metabolic Panel: Recent Labs  Lab 10/24/20 0329  NA 140  K 4.1  CL 104  CO2 28  GLUCOSE 137*  BUN 19  CREATININE 0.60  CALCIUM 8.8*   Liver Function Tests: No results for input(s): AST, ALT, ALKPHOS, BILITOT, PROT, ALBUMIN in the last 168 hours. No results for input(s): LIPASE, AMYLASE in the last 168 hours. No results for input(s): AMMONIA in the last 168 hours. CBC: Recent Labs  Lab 10/22/20 1139 10/24/20 0329  WBC 11.3* 7.2  HGB 9.8* 8.4*  HCT 31.2* 26.4*  MCV 82.1 82.5  PLT 390 351   Cardiac Enzymes: No results for input(s): CKTOTAL, CKMB, CKMBINDEX, TROPONINI in the last 168  hours. BNP (last 3 results) No results for input(s): BNP in the last 8760 hours.  ProBNP (last 3 results) No results for input(s): PROBNP in the last 8760 hours.  CBG: Recent Labs  Lab 10/27/20 1607 10/27/20 1920 10/27/20 2356 10/28/20 0338 10/28/20 0731  GLUCAP 128* 97 134* 125* 124*    Recent Results (from the past 240 hour(s))  Culture, Urine     Status: Abnormal (Preliminary result)   Collection Time: 10/23/20  1:20 AM   Specimen: Urine, Catheterized  Result Value Ref Range Status   Specimen Description URINE, CATHETERIZED  Final   Special Requests NONE  Final   Culture (A)  Final    >=100,000 COLONIES/mL ENTEROBACTER CLOACAE REPEATING SUSCEPTIBILITY Performed at [facility], [place] [address] [place], [place] [address]    Report Status PENDING  Incomplete     Studies: No results found.  Scheduled Meds: . amLODipine  2.5 mg Per Tube Daily  . atorvastatin  40 mg Per Tube Daily  . baclofen  5 mg Per Tube 2 times per day  . baclofen  7.5 mg Per Tube Daily  . chlorhexidine gluconate (MEDLINE KIT)  15 mL Mouth Rinse BID  . Chlorhexidine Gluconate Cloth  6 each Topical Daily  . enoxaparin (LOVENOX) injection  40 mg Subcutaneous Q24H  . feeding supplement (PROSource TF)  45 mL Per Tube Daily  . free water  100 mL Per Tube Q4H  . hydrALAZINE  50 mg Per Tube Q8H  . insulin aspart  0-9 Units Subcutaneous Q4H  . mouth rinse  15 mL Mouth Rinse 5 X Daily  . metoprolol tartrate  100 mg Per Tube BID  . modafinil  200 mg Per Tube Daily  . pantoprazole sodium  40 mg Per Tube BID  . scopolamine  1 patch Transdermal Q72H  . sennosides  5 mL Per Tube QHS   Continuous Infusions: . sodium chloride Stopped (10/26/20 1445)  . feeding supplement (OSMOLITE 1.2 CAL) 1,000 mL (10/27/20 2013)  . meropenem (MERREM) IV 1 g (10/28/20 0514)    Principal Problem:   ICH (intracerebral hemorrhage) (HCC) Active Problems:   Intraparenchymal hemorrhage of brain (HCC)   Acute  hypoxemic respiratory failure (HCC)   Fever   Endotracheal tube present   Status post tracheostomy [facility])   Palliative care by specialist   Muscle hypertonicity   Oropharyngeal dysphagia   Brain injury with loss of consciousness ([place])   Poor prognosis   Aspiration pneumonia of right lower lobe (HCC)   Gram-negative infection   Abnormal urinalysis   Infection caused by  Enterobacter cloacae   Consultants:  Palliative care  Pulm  Neurology   Procedures:  Tracheostomy  PEG tube  EEG  Echocardiogram  Antibiotics: Anti-infectives (From admission, onward)   Start     Dose/Rate Route Frequency Ordered Stop   10/27/20 0845  meropenem (MERREM) 1 g in sodium chloride 0.9 % 100 mL IVPB        1 g 200 mL/hr over 30 Minutes Intravenous Every 8 hours 10/27/20 0752     10/26/20 1145  cefTRIAXone (ROCEPHIN) 1 g in sodium chloride 0.9 % 100 mL IVPB  Status:  Discontinued        1 g 200 mL/hr over 30 Minutes Intravenous Every 24 hours 10/26/20 1056 10/27/20 0752   10/14/20 1815  Ampicillin-Sulbactam (UNASYN) 3 g in sodium chloride 0.9 % 100 mL IVPB        3 g 200 mL/hr over 30 Minutes Intravenous Every 6 hours 10/14/20 1754 10/19/20 1340   10/08/20 0845  ciprofloxacin (CIPRO) tablet 500 mg  Status:  Discontinued        500 mg Per Tube 2 times daily 10/08/20 0754 10/14/20 1731   09/21/20 1545  ceFAZolin (ANCEF) IVPB 1 g/50 mL premix        1 g 100 mL/hr over 30 Minutes Intravenous Every 8 hours 09/21/20 1457 09/28/20 0647   09/20/20 0600  ceFAZolin (ANCEF) IVPB 1 g/50 mL premix  Status:  Discontinued        1 g 100 mL/hr over 30 Minutes Intravenous Every 8 hours 09/14/20 0903 09/14/20 0904   09/16/20 1300  vancomycin (VANCOREADY) IVPB 1250 mg/250 mL  Status:  Discontinued        1,250 mg 166.7 mL/hr over 90 Minutes Intravenous Every 24 hours 09/15/20 1232 09/17/20 0956   09/15/20 1230  vancomycin (VANCOREADY) IVPB 1250 mg/250 mL        1,250 mg 166.7 mL/hr over 90 Minutes  Intravenous  Once 09/15/20 1131 09/15/20 1402   09/13/20 1400  ceFEPIme (MAXIPIME) 2 g in sodium chloride 0.9 % 100 mL IVPB        2 g 200 mL/hr over 30 Minutes Intravenous Every 8 hours 09/13/20 1341 09/19/20 2208   09/06/20 0600  vancomycin (VANCOREADY) IVPB 1250 mg/250 mL        1,250 mg 166.7 mL/hr over 90 Minutes Intravenous  Once 09/06/20 0541 09/06/20 0739   09/05/20 1400  ceFAZolin (ANCEF) IVPB 1 g/50 mL premix  Status:  Discontinued        1 g 100 mL/hr over 30 Minutes Intravenous Every 8 hours 09/05/20 1120 09/13/20 1341   08/30/20 0600  vancomycin (VANCOREADY) IVPB 500 mg/100 mL  Status:  Discontinued        500 mg 100 mL/hr over 60 Minutes Intravenous Every 12 hours 08/29/20 1554 09/01/20 1115   08/29/20 1700  ceFEPIme (MAXIPIME) 2 g in sodium chloride 0.9 % 100 mL IVPB  Status:  Discontinued        2 g 200 mL/hr over 30 Minutes Intravenous Every 12 hours 08/29/20 1554 09/01/20 1115   08/29/20 1645  vancomycin (VANCOREADY) IVPB 1250 mg/250 mL        1,250 mg 166.7 mL/hr over 90 Minutes Intravenous  Once 08/29/20 1554 08/29/20 1820       Time spent: 25 minutes    Erin Hearing ANP  [facility] 7 am - 330 pm/M-F for direct patient care and secure chat Please refer to Amion for contact info 62  days

## 2020-10-28 NOTE — Progress Notes (Signed)
RT noticed this AM that patient's trach has not been changed since trach was changed on 3/11 to a cuffless trach.  Contacted MD to see if wanted trach to be changed today and per MD trach can go 8 weeks before changing.  This will put trach change for May 6th.  Patient tolerating well at this time.  Will continue to monitor.

## 2020-10-29 LAB — GLUCOSE, CAPILLARY
Glucose-Capillary: 107 mg/dL — ABNORMAL HIGH (ref 70–99)
Glucose-Capillary: 111 mg/dL — ABNORMAL HIGH (ref 70–99)
Glucose-Capillary: 131 mg/dL — ABNORMAL HIGH (ref 70–99)
Glucose-Capillary: 134 mg/dL — ABNORMAL HIGH (ref 70–99)
Glucose-Capillary: 138 mg/dL — ABNORMAL HIGH (ref 70–99)
Glucose-Capillary: 148 mg/dL — ABNORMAL HIGH (ref 70–99)

## 2020-10-29 NOTE — Progress Notes (Signed)
[facility] PROGRESS NOTE    Charlene [name]  WGY:[id number] DOB: 27-Oct-1965 DOA: 08/26/2020 PCP: Patient, [name] (Inactive)      Brief Narrative:  Charlene [name] is a 55 year old F with history hypertension who presented after developing left-sided weakness, headaches, being progressively more confused, lethargic and with slurred speech.  In the ER, she was called a code stroke, and CT head showed right thalamic/basal ganglia hemorrhage with intraventricular extension, she was transferred emergently to the intensive care unit for drain placement.   Subsequent hospitalization complicated by obtundation and respiratory arrest and persistent encephalopathy.  Now with tracheostomy and PEG tube.   .   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 - Continue baclofen, modafinil  Hypertension Pressure mostly controlled in the last 24 hours - Continue amlodipine, hydralazine, metoprolol   ESBL UTI -Continue meropenem, day 3 - Continue mirabegron  Dysphagia due to acute on persistent metabolic encephalopathy - Continue tube feeds and free water, and insulin corrections  Anemia of critical illness No recent bleeding     Disposition: Status is: Inpatient  Remains inpatient appropriate because:Altered mental status and Unsafe d/c plan   Dispo: The patient is from: Home              Anticipated d/c is to: Unclear              Patient currently is not medically stable to d/c.   Difficult to place patient Yes       Level of care: Progressive       MDM: The below labs and imaging reports were reviewed and summarized above.  Medication management as above.   DVT prophylaxis: enoxaparin (LOVENOX) injection 40 mg Start: 09/01/20 1600 SCD's Start: 08/27/20 0026  Code Status: FULL Family  Communication: Husband            Subjective: No new fever, agitation, respiratory distress.  No obvious pain complaints.  Objective: Vitals:   10/29/20 1123 10/29/20 1136 10/29/20 1549 10/29/20 1552  BP: 131/87 131/87 (!) 141/88 (!) 141/88  Pulse: 83 82 91 88  Resp: '19 19 19 19  ' Temp: 98.9 F (37.2 C)  98.9 F (37.2 C)   TempSrc: Oral  Oral   SpO2: 100% 99% 100% 100%  Weight:      Height:        Intake/Output Summary (Last 24 hours) at 10/29/2020 1722 Last data filed at 10/29/2020 1707 Gross per 24 hour  Intake 2320.5 ml  Output --  Net 2320.5 ml   Filed Weights   10/24/20 0342 10/25/20 0500 10/27/20 0349  Weight: 68.3 kg 68.9 kg 67 kg    Examination: General appearance:  adult female, semivegetative state, no purposeful movements that I can appreciate, no spontaneous verbalizations HEENT: Anicteric, conjunctiva pink, lids and lashes normal. No nasal deformity, discharge, epistaxis.  Lurline Idol appears clean and dry, scant secretions if any.   Skin: Warm and dry.   Cardiac: RRR, nl S1-S2, no murmurs appreciated.    Respiratory: Normal respiratory rate and rhythm.  CTAB without rales or wheezes.  Tracheostomy looks normal. Abdomen: Abdomen soft.  No grimace to palpation. No ascites, distension, hepatosplenomegaly.   MSK: No deformities or effusions.  Contractures in all 4 limbs, worse on the left. Neuro: Eyes open, but does not make eye contact, no spontaneous verbalizations, no spontaneous movements.  Speech fluent.    Psych: Unable to assess  Data Reviewed: I have personally reviewed following labs and imaging studies:  CBC: Recent Labs  Lab 10/24/20 0329  WBC 7.2  HGB 8.4*  HCT 26.4*  MCV 82.5  PLT 262   Basic Metabolic Panel: Recent Labs  Lab 10/24/20 0329  NA 140  K 4.1  CL 104  CO2 28  GLUCOSE 137*  BUN 19  CREATININE 0.60  CALCIUM 8.8*   GFR: Estimated Creatinine Clearance: 73.2 mL/min (by C-G formula based on SCr of 0.6 mg/dL). Liver  Function Tests: No results for input(s): AST, ALT, ALKPHOS, BILITOT, PROT, ALBUMIN in the last 168 hours. No results for input(s): LIPASE, AMYLASE in the last 168 hours. No results for input(s): AMMONIA in the last 168 hours. Coagulation Profile: No results for input(s): INR, PROTIME in the last 168 hours. Cardiac Enzymes: No results for input(s): CKTOTAL, CKMB, CKMBINDEX, TROPONINI in the last 168 hours. BNP (last 3 results) No results for input(s): PROBNP in the last 8760 hours. HbA1C: No results for input(s): HGBA1C in the last 72 hours. CBG: Recent Labs  Lab 10/28/20 2356 10/29/20 0432 10/29/20 0722 10/29/20 1126 10/29/20 1623  GLUCAP 132* 131* 107* 134* 111*   Lipid Profile: No results for input(s): CHOL, HDL, LDLCALC, TRIG, CHOLHDL, LDLDIRECT in the last 72 hours. Thyroid Function Tests: No results for input(s): TSH, T4TOTAL, FREET4, T3FREE, THYROIDAB in the last 72 hours. Anemia Panel: No results for input(s): VITAMINB12, FOLATE, FERRITIN, TIBC, IRON, RETICCTPCT in the last 72 hours. Urine analysis:    Component Value Date/Time   COLORURINE YELLOW 10/23/2020 1912   APPEARANCEUR CLOUDY (A) 10/23/2020 1912   LABSPEC 1.013 10/23/2020 1912   PHURINE 6.0 10/23/2020 1912   GLUCOSEU NEGATIVE 10/23/2020 1912   HGBUR NEGATIVE 10/23/2020 1912   BILIRUBINUR NEGATIVE 10/23/2020 1912   KETONESUR NEGATIVE 10/23/2020 1912   PROTEINUR NEGATIVE 10/23/2020 1912   UROBILINOGEN 0.2 02/21/2014 1629   NITRITE NEGATIVE 10/23/2020 1912   LEUKOCYTESUR NEGATIVE 10/23/2020 1912   Sepsis Labs: '@LABRCNTIP' (procalcitonin:4,lacticacidven:4)  ) Recent Results (from the past 240 hour(s))  Culture, Urine     Status: Abnormal   Collection Time: 10/23/20  1:20 AM   Specimen: Urine, Catheterized  Result Value Ref Range Status   Specimen Description URINE, CATHETERIZED  Final   Special Requests NONE  Final   Culture (A)  Final    >=100,000 COLONIES/mL ENTEROBACTER CLOACAE Susceptibility  Pattern Suggests Possibility of an Extended Spectrum Beta Lactamase Producer. Contact Laboratory Within 7 Days if Confirmation Warranted. Performed at [facility], [place] [address] [place], [place] [address]    Report Status 10/28/2020 FINAL  Final   Organism ID, Bacteria ENTEROBACTER CLOACAE (A)  Final      Susceptibility   Enterobacter cloacae - MIC*    CEFAZOLIN >=64 RESISTANT Resistant     CEFEPIME >=32 RESISTANT Resistant     CIPROFLOXACIN 2 INTERMEDIATE Intermediate     GENTAMICIN <=1 SENSITIVE Sensitive     IMIPENEM 1 SENSITIVE Sensitive     NITROFURANTOIN 64 INTERMEDIATE Intermediate     TRIMETH/SULFA >=320 RESISTANT Resistant     PIP/TAZO 32 INTERMEDIATE Intermediate     * >=100,000 COLONIES/mL ENTEROBACTER CLOACAE         Radiology Studies: No results found.      Scheduled Meds: . amLODipine  2.5 mg Per Tube Daily  . atorvastatin  40 mg Per Tube Daily  . baclofen  5 mg Per Tube 2 times per day  . baclofen  7.5 mg Per Tube Daily  .  chlorhexidine gluconate (MEDLINE KIT)  15 mL Mouth Rinse BID  . Chlorhexidine Gluconate Cloth  6 each Topical Daily  . enoxaparin (LOVENOX) injection  40 mg Subcutaneous Q24H  . feeding supplement (PROSource TF)  45 mL Per Tube Daily  . free water  100 mL Per Tube Q4H  . hydrALAZINE  50 mg Per Tube Q8H  . insulin aspart  0-9 Units Subcutaneous Q4H  . mouth rinse  15 mL Mouth Rinse 5 X Daily  . metoprolol tartrate  100 mg Per Tube BID  . mirabegron ER  25 mg Oral Daily  . modafinil  200 mg Per Tube Daily  . pantoprazole sodium  40 mg Per Tube BID  . scopolamine  1 patch Transdermal Q72H  . sennosides  5 mL Per Tube QHS   Continuous Infusions: . sodium chloride Stopped (10/26/20 1445)  . feeding supplement (OSMOLITE 1.2 CAL) 1,000 mL (10/29/20 1707)  . meropenem (MERREM) IV Stopped (10/29/20 1456)     LOS: 63 days    Time spent: 25 minutes    Edwin Dada, MD [facility] 10/29/2020, 5:22 PM      Please page though [employer] or Epic secure chat:  For [employer], Adult nurse

## 2020-10-29 NOTE — Progress Notes (Signed)
Occupational Therapy Treatment Patient Details Name: Charlene [name] MRN: [id number] DOB: [birth year] Today's Date: 10/29/2020    History of present illness Pt is 55 y/o female [facility] on [facility] with L sided weakness and headaches. PMH includes anxiety, HTN. 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 s/p Right frontal ventricular catheter placement on 2/10, stopped functioning on 2/11.  MRI 2/11 shows right greater than left mesial temporal lobes / parahippocampal cortex, bilateral basal ganglia, and possibly the upper pons that are concerning for acute infarcts, most likely secondary to mass effect from the hemorrhage. ETT 2/10-present.  Pt with trach and PEG on 09/10/20. Hospital course complicated by persistent fever, Serratia pneumonia and now pre-orbital cellulitis.   OT comments  Patient supine in bed with spouse present. Noted both eyes open today, both eyes scanning from L to R and appear to be moving together at times.  L eye remains with downward gaze.  Patient able to focus on therapist when therapist voicing "Charlene [name] Look at me" given increased time and multimodal cueing, appears to focus on picture/scan up to 10-15 degrees following picture but inconsistent as looses attention to picture easily. With thumb movement (UE positioned in optimal position) given repeated cues, multimodal cueing moving thumb on 3/4 trials (with rest breaks in between) and responding best to "Charlene [name] move your thumb" (instead of thumbs up). Educated spouse on role of OT and goal OT is working towards currently, spouse voiced understanding and eager to bring in familiar items for OT to utilize during sessions.  Spoke to RN about moving rooms to other side of hallway, as this has not happened yet. Plan for next session to occlude L eye, bring soft pillow splint for L elbow. Will  follow acutely.    Follow Up Recommendations  SNF;Supervision/Assistance - 24 hour    Equipment Recommendations  Hospital bed;Wheelchair cushion (measurements OT);Wheelchair (measurements OT);Other (comment)    Recommendations for Other Services      Precautions / Restrictions Precautions Precautions: Fall Precaution Comments: peg and trach       Mobility Bed Mobility                    Transfers                      Balance                                           ADL either performed or assessed with clinical judgement   ADL                                         General ADL Comments: total A     Vision   Additional Comments: Patient with both eyes open today.  L eye remains downward but scanning to L and R, R eye scanning to L and R as well.  Eyes appear to be trying to scan together at times.  Nystagmus noted spontanously, R eye able to briefly fixate on therapist and scan when presented with picture approx 10-15 degrees 2/5 trials. More consistency with pt looking at therapist when her name was provided.  Perception     Praxis      Cognition Arousal/Alertness: Awake/alert Behavior During Therapy: Flat affect Overall Cognitive Status: Difficult to assess                                 General Comments: pt with both eyes open today, with simple command of "look at me" and "move your thumb" given increased time (up to 3 min) and mulitmodal cueing with her name provided first.  Able to fixate on therapist and scan approx 10-15 degrees with R eye to follow picture of her kids and move her thumb on 3/4 trials.        Exercises Exercises: Other exercises Other Exercises Other Exercises: PROM of L UE into extension and abduction of L shoulder and elbow   Shoulder Instructions       General Comments session focused on command following, tracking; spouse present and educated on OT role,  recommended bringing in familar objects from home    Pertinent Vitals/ Pain       Pain Assessment: Faces Faces Pain Scale: No hurt  Home Living                                          Prior Functioning/Environment              Frequency  Min 2X/week        Progress Toward Goals  OT Goals(current goals can now be found in the care plan section)  Progress towards OT goals: Progressing toward goals  Acute Rehab OT Goals Patient Stated Goal: pt's family wants her to go to intense rehab OT Goal Formulation: Patient unable to participate in goal setting  Plan Discharge plan remains appropriate;Frequency remains appropriate    Co-evaluation                 AM-PAC OT "6 Clicks" Daily Activity     Outcome Measure   Help from another person eating meals?: Total Help from another person taking care of personal grooming?: Total Help from another person toileting, which includes using toliet, bedpan, or urinal?: Total Help from another person bathing (including washing, rinsing, drying)?: Total Help from another person to put on and taking off regular upper body clothing?: Total Help from another person to put on and taking off regular lower body clothing?: Total 6 Click Score: 6    End of Session    OT Visit Diagnosis: Other symptoms and signs involving the nervous system (R29.898);Other symptoms and signs involving cognitive function;Other (comment) Symptoms and signs involving cognitive functions: Other Nontraumatic ICH Hemiplegia - caused by: Other Nontraumatic intracranial hemorrhage   Activity Tolerance Patient tolerated treatment well   Patient Left in bed;with call bell/phone within reach;with bed alarm set;with family/visitor present   Nurse Communication Mobility status;Other (comment) (moving room to other side of hallway)        Time: 1331-1403 OT Time Calculation (min): 32 min  Charges: OT General Charges $OT Visit: 1  Visit OT Treatments $Therapeutic Activity: 8-22 mins $Neuromuscular Re-education: 8-22 mins  Barry Brunner, OT Acute Rehabilitation Services Pager [phone] Office [phone]    [name] 10/29/2020, 4:01 PM

## 2020-10-29 NOTE — Progress Notes (Addendum)
CSW sent e-mail to [facility] Director to inquire about the possibility of charity LTACH placement.  CSW spoke with Mariella Saa of [facility] who states due to the patient's recent Medicaid and Disability applications, she will be unable to offer a bed at this time due to uncertainties of patient receiving benefits.   Edwin Dada, MSW, LCSW Transitions of Care  Clinical Social Worker II [phone]

## 2020-10-30 LAB — GLUCOSE, CAPILLARY
Glucose-Capillary: 112 mg/dL — ABNORMAL HIGH (ref 70–99)
Glucose-Capillary: 113 mg/dL — ABNORMAL HIGH (ref 70–99)
Glucose-Capillary: 116 mg/dL — ABNORMAL HIGH (ref 70–99)
Glucose-Capillary: 126 mg/dL — ABNORMAL HIGH (ref 70–99)
Glucose-Capillary: 86 mg/dL (ref 70–99)
Glucose-Capillary: 86 mg/dL (ref 70–99)

## 2020-10-30 NOTE — Progress Notes (Signed)
[facility] PROGRESS NOTE  Charlene [name] IBB:[id number] DOB: [birth year] DOA: 08/26/2020 PCP: Patient, [name] (Inactive)    October 08, 2020     10/16/2020                        10/16/2020     10/18/2020                        10/20/2020    Status: Remains inpatient appropriate because:Altered mental status, Unsafe d/c plan, IV treatments appropriate due to intensity of illness or inability to take PO and Inpatient level of care appropriate due to severity of illness   Dispo: The patient is from: Home              Anticipated d/c is to: SNF vs brain injury-family does not wish to place patient in SNF.  If this is the only option they would like to consider taking the patient home although this may be a difficult undertaking given current requirement for trach and understandable fears about managing such complex patient in the home environment.  They have been updated as to why this patient currently is not a candidate for aggressive rehabilitative therapies like would be provided at a brain injury recovery rehab.               Patient currently is not medically stable to d/c.   Difficult to place patient Yes   Level of care: Progressive  Code Status: Full Family Communication:  extensive conversation via telephone with daughter Charlene [name] [phone]) at bedside on 4/1.; husband Charlene [name] [phone]) is her POA and legal contact.  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 Subsequently on 2/10, she developed obtundation and respiratory arrest leading to intubation. She has remained persistently obtunded, now has a trach and a PEG. Hospital course complicated by persistent fever,Serratia pneumoniaand now pre-orbital cellulitis  As of 3/25 with adjustments and hypertonicity medications patient seems to be improving.  She is also waking up more although limited interaction but both eyes are now open and she tracks with the left eye.  Multiple discussions with husband who has confirmed he has not delegated other family members to be POA and he is the primary legal contact for patient.  He has agreed to let patient's daughter take the lead on completing and following up on Medicaid and disability applications including having these applications sent to her address.  2/09 Admit for [place] 2/10 EVD placed, later intubated due to obtundation and respiratory arrest 2/11 MRI Brain, hypertonic saline d/c'd, EVD not working. Husband did not consent to PICC  2/13 EVD removed 2/17 [place] discussion with family, PMT 2/24 Bedside trach and PEG 3/1 Transferred to [facility]  Subjective: Awake.  Decision on left side for pericare.  Clearly has frowning/frustrated look on face and is using right  hand to grip side rail.  Unable to get patient to follow simple commands today but she does track with right eye and will look at me when I speak to her.  Objective: Vitals:   10/29/20 2350 10/30/20 0320  BP: (!) 149/99 (!) 158/94  Pulse: 98 97  Resp: (!) 22 19  Temp: 99.9 F (37.7 C) 99.8 F (37.7 C)  SpO2: 96% 97%    Intake/Output Summary (Last 24 hours) at 10/30/2020 0754 Last data filed at 10/29/2020 1800 Gross per 24 hour  Intake 763.75 ml  Output --  Net 763.75 ml   Filed Weights   10/25/20 0500 10/27/20 0349 10/30/20 0500  Weight: 68.9 kg 67  kg 76 kg    Exam:  Constitutional: Alert, no acute distress.  Appears to be comfortable although frustrated with current pericare in process. Respiratory: #6.0 cuffless trach, trach collar with FiO2 21% at 5L,O2 sats 97 to 99%.  Anterior lung sounds are clear.  No increased work of breathing or tachypnea. Cardiovascular: S1-S2.  No peripheral edema.  Regular intermittently tachycardic pulse Abdomen:  PEG tube-LBM 4/11, soft and nontender.  Normoactive bowel sounds.  Tube feedings infusing Musculoskeletal: Severe torticollis with associated left neck deviation essentially resolved..  Previous hypertonicity significantly improved without any rebound spasticity with PROM Neurologic: CN 2-12 with abnormalities as follows: Blink reflex is present bilaterally, persistent medial disconjugate downward gaze involving the left eye. Lower extremity foot splints in place.  Able to track with right eye. Patient has spontaneous purposeful movement that is gross motor in nature of right upper extremity.   Psych: Awake but did not follow simple commands for me.  OT documents that during recent session patient eventually did hold up her thumb upon request.    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 Intraventricular penetration with blood filling the 3rd and forth ventricles and nearly filling the right lateral ventricle.  -MRI 2/11 with similar findings as well ateffacement of prepontine cistern and similar 4 mm of leftward midline shift. Separate areas of ristricted diffusion and edema involving the right greater than left mesial temporal lobes / parahippocampal cortex, bilateral basal ganglia, and possibly the upper pons that are concerning for acute infarcts, most likely 2/2 mass effect  from the hemorrhage (see report)/  -2/19 MRI with acute to early subacute infarcts in the bilateral cerebral white matter and right cerebellum, new from 2/11 - interval evolution of multiple other infarcts present on prior MRI, increased hemorrhage along old right EVD tract (see report) - 2/21 head CT with expected evolution of ICH since 2/13 - Echo with normal EF, no intracardiac source of embolism - CTA head/neck without intracranial arterial occlusion or high grade stenosis, no dissection, aneurysm, or hemodynamically significant stenosis of the carotid or vertebral arteries - Head CT 3/9 with expected evolution of hemorrhage as above - no antithrombotic 2/2 ICH -Obstructive hydrocephalus -> s/p external ventricular catheter placement on 08/27/20 by neurosurgery - EVD clamped on 2/12 and d/c'd on 2/13 -EEG suggestive of cortical dysfunction in right hemisphere likely 2/2 underlying bleed as well as severe diffuse encephalopathy, nonspecific etiology . -Continue baclofen 5 mg at 8 AM and 4 PM and 7.5 mg at bedtime.  LFTs remain stable with baseline subtle elevation without change since initiation of baclofen. -Continue Provigil 200 mg daily as recommended by PMR team -Add scheduled  Senokot at HS given requirement for SMRs -OOB to chair daily and PROM every 4 hours per nursing staff -Per OT recommendations have made staff aware that patient needs to move to outside of the hall to improve visual-spatial interaction and acuity.  ESBL positive Enterobacter UTI -> 100,000 colonies-sensitive to gentamicin imipenem, resistant to cefazolin, cefepime Septra and intermediate to Cipro, nitrofurantoin energetically. -Continue meropenem for total of 7 days. -DC Purewick during acute UTI -Contact isolation -Continue Myrbetriq for suspected bladder spasm  Tachycardia/suboptimal BP -Resolved -Was given 1L of NS and vfree water increased fom 100 c q 4 hrs to 200 cc  -HR and BP improved -Norvasc was  decreased to 2.5 mg 4/8  Neck Contracture/torticollis -Preference for keeping head turned to the left. -Continue baclofen as above -- continue PT/OT weekly per their recommendations given patient's inability to effectively participate at this juncture.  Nursing staff performing PROM activities.  Family also instructed and has been performing intermittently. PT eval 4/12      Pt with no functional movement noted this session. Pt not following any commands or tracking therapist around room. Continue to feel that it would be beneficial for the pt to be positioned in a room where the hallway is on her L side, to encourage L attention. Pt could also benefit from an air mattress prophylactically to decrease risk for pressure injuries in conjunction with her turn schedule. Discussed with RN who agreed and nurse secretary to order. Acute PT continues to recommend SNF at d/c as she is currently not at a level to tolerate the increased intensity of CIR level therapies, although this appears to be family's first choice. Will continue to follow and progress as able per POC.       OT eval 4/14:     Patient supine in bed with spouse present. Noted both eyes open today, both eyes scanning from L to R and appear to be moving together at times.  L eye remains with downward gaze.  Patient able to focus on therapist when therapist voicing "Vivan Look at me" given increased time and multimodal cueing, appears to focus on picture/scan up to 10-15 degrees following picture but inconsistent as looses attention to picture easily. With thumb movement (UE positioned in optimal position) given repeated cues, multimodal cueing moving thumb on 3/4 trials (with rest breaks in between) and responding best to "Lecia move your thumb" (instead of thumbs up). Educated spouse on role of OT and goal OT is working towards currently, spouse voiced understanding and eager to bring in familiar items for OT to utilize during sessions.   Spoke to RN about moving rooms to other side of hallway, as this has not happened yet. Plan for next session to occlude L eye, bring soft pillow splint for L elbow. Will follow acutely.    -OT would also like to have patient placed in a different room across the hallway which would allow for optimal visualization using right eye based on positioning in bed and said room.       Hypertensive emergency/Malignant HTN Resolved -Home medications include Coreg and HCTZ which have not been reordered -Continue low-dose Norvasc as well as metoprolol  Acute hypoxemic respiratory failure secondary to CVA requiring mechanical ventilation and tracheostomy tube -Stable on trach collar and has been transitioned to a #6 cuffless trach -Has significant altered mentation and inability to manage oral secretions at present time is not a safe candidate for decannulation.  Discussed this with family as major obstacle in  discharging to home.  This will also decrease options for SNF placement. -Appreciate trach team assistance in management  Pain likely related to underlying hypertonicity -Significantly improved -Morphine IV, oxycodone and Tylenol prn (watch elev LFTs) -Continue baclofen as above  Dysphagia/Nutrition Status: Nutrition Problem: Inadequate oral intake Etiology: inability to eat Signs/Symptoms: NPO status Interventions: Tube feeding consisting of Osmolite 1.2 Cal at 55 cc/h; continue Prosource liquid 45 cc daily, free water 100 cc every 4 hours per tube; will also continue to follow CBGs regularly while on tube feedings and provide SSI Estimated body mass index is 27.88 kg/m as calculated from the following:   Height as of this encounter: 5' 5" (1.651 m).   Weight as of this encounter: 76 kg. If patient eventually discharged to home to teach family how to bolus feed as opposed to utilizing continuous tube feeding infusions.   Other problems: Anasarca -bilateral upper extremity edema,  minimal LE edema - suspect related to prolonged hospitalization, hypoalbuminemia -upper extremity US-> age indeterminate SVT of right cephalic vein  Elevated LFT's - Remain elevated but stable without any obstructive pattern -Continue baclofen and statin  Tachycardia/suboptimal BP -Resolved -Was given 1L of NS and vfree water increased fom 100 c q 4 hrs to 200 cc  -HR and BP improved -Norvasc was decreased to 2.5 mg 4/8  Bacterial Conjunctivitis/periorbital cellulitis - MRI 3/7 orbits with L periorbital cellulitis  -- CT head 3/9 with persistent subtotal opacification of paranasal sinuses and mastoid air cells, bubbly lucency at base of R maxillary sinus (likely odontogenic in origin) - CT maxillofacial 3/12 - improving cellulitis  - s/p erythromycin ointment - completed course of Ancef   Chronic pansinusitis/recurrent fevers 2/2 aspiration PNA;History of Serratia Pneumonia  Hospital Acquired Pneumonia:  -Tracheal aspirate from 2/25 and 3/1 with serratia - cefepime 2/27-3/5; repeat sputum cx 3/21 + serratia (likely colonization but w/ recurrent fever opted to treat) -Has completed antibiotics recently for bilateral orbital cellulitis -started on Cipro for tracheal aspirate positive for Serratia marcescens   -Repeat sinus CT demonstrates progressive complete bilateral maxillary sinus opacification with unchanged complete left sphenoid sinus opacification and right sphenoid sinus fluid c/w chronic state  -CT Chest 3/30 with aspiration PNA-dtr updated at bedside by attending MD- Cipro changed to Unasyn IV-Unasyn now completed -no increased O2 needs  Anemia -Stable, follow     Data Reviewed: Basic Metabolic Panel: Recent Labs  Lab 10/24/20 0329  NA 140  K 4.1  CL 104  CO2 28  GLUCOSE 137*  BUN 19  CREATININE 0.60  CALCIUM 8.8*   Liver Function Tests: No results for input(s): AST, ALT, ALKPHOS, BILITOT, PROT, ALBUMIN in the last 168 hours. No results for input(s):  LIPASE, AMYLASE in the last 168 hours. No results for input(s): AMMONIA in the last 168 hours. CBC: Recent Labs  Lab 10/24/20 0329  WBC 7.2  HGB 8.4*  HCT 26.4*  MCV 82.5  PLT 351   Cardiac Enzymes: No results for input(s): CKTOTAL, CKMB, CKMBINDEX, TROPONINI in the last 168 hours. BNP (last 3 results) No results for input(s): BNP in the last 8760 hours.  ProBNP (last 3 results) No results for input(s): PROBNP in the last 8760 hours.  CBG: Recent Labs  Lab 10/29/20 1126 10/29/20 1623 10/29/20 1957 10/29/20 2358 10/30/20 0320  GLUCAP 134* 111* 138* 148* 116*    Recent Results (from the past 240 hour(s))  Culture, Urine     Status: Abnormal   Collection Time: 10/23/20  1:20 AM  Specimen: Urine, Catheterized  Result Value Ref Range Status   Specimen Description URINE, CATHETERIZED  Final   Special Requests NONE  Final   Culture (A)  Final    >=100,000 COLONIES/mL ENTEROBACTER CLOACAE Susceptibility Pattern Suggests Possibility of an Extended Spectrum Beta Lactamase Producer. Contact Laboratory Within 7 Days if Confirmation Warranted. Performed at [facility], [place] [address] [place], [place] [address]    Report Status 10/28/2020 FINAL  Final   Organism ID, Bacteria ENTEROBACTER CLOACAE (A)  Final      Susceptibility   Enterobacter cloacae - MIC*    CEFAZOLIN >=64 RESISTANT Resistant     CEFEPIME >=32 RESISTANT Resistant     CIPROFLOXACIN 2 INTERMEDIATE Intermediate     GENTAMICIN <=1 SENSITIVE Sensitive     IMIPENEM 1 SENSITIVE Sensitive     NITROFURANTOIN 64 INTERMEDIATE Intermediate     TRIMETH/SULFA >=320 RESISTANT Resistant     PIP/TAZO 32 INTERMEDIATE Intermediate     * >=100,000 COLONIES/mL ENTEROBACTER CLOACAE     Studies: No results found.  Scheduled Meds: . amLODipine  2.5 mg Per Tube Daily  . atorvastatin  40 mg Per Tube Daily  . baclofen  5 mg Per Tube 2 times per day  . baclofen  7.5 mg Per Tube Daily  . chlorhexidine gluconate  (MEDLINE KIT)  15 mL Mouth Rinse BID  . Chlorhexidine Gluconate Cloth  6 each Topical Daily  . enoxaparin (LOVENOX) injection  40 mg Subcutaneous Q24H  . feeding supplement (PROSource TF)  45 mL Per Tube Daily  . free water  100 mL Per Tube Q4H  . hydrALAZINE  50 mg Per Tube Q8H  . insulin aspart  0-9 Units Subcutaneous Q4H  . mouth rinse  15 mL Mouth Rinse 5 X Daily  . metoprolol tartrate  100 mg Per Tube BID  . mirabegron ER  25 mg Oral Daily  . modafinil  200 mg Per Tube Daily  . pantoprazole sodium  40 mg Per Tube BID  . scopolamine  1 patch Transdermal Q72H  . sennosides  5 mL Per Tube QHS   Continuous Infusions: . sodium chloride Stopped (10/26/20 1445)  . feeding supplement (OSMOLITE 1.2 CAL) 1,000 mL (10/29/20 1707)  . meropenem (MERREM) IV 1 g (10/30/20 0539)    Principal Problem:   ICH (intracerebral hemorrhage) (HCC) Active Problems:   Intraparenchymal hemorrhage of brain (HCC)   Acute hypoxemic respiratory failure (HCC)   Fever   Endotracheal tube present   Status post tracheostomy ([place])   Palliative care by specialist   Muscle hypertonicity   Oropharyngeal dysphagia   Brain injury with loss of consciousness ([place])   Poor prognosis   Aspiration pneumonia of right lower lobe (HCC)   Gram-negative infection   Abnormal urinalysis   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   Acute cystitis without hematuria   Consultants:  Palliative care  Pulm  Neurology   Procedures:  Tracheostomy  PEG tube  EEG  Echocardiogram  Antibiotics: Anti-infectives (From admission, onward)   Start     Dose/Rate Route Frequency Ordered Stop   10/27/20 0845  meropenem (MERREM) 1 g in sodium chloride 0.9 % 100 mL IVPB        1 g 200 mL/hr over 30 Minutes Intravenous Every 8 hours 10/27/20 0752     10/26/20 1145  cefTRIAXone (ROCEPHIN) 1 g in sodium chloride 0.9 % 100 mL IVPB  Status:  Discontinued  1 g 200  mL/hr over 30 Minutes Intravenous Every 24 hours 10/26/20 1056 10/27/20 0752   10/14/20 1815  Ampicillin-Sulbactam (UNASYN) 3 g in sodium chloride 0.9 % 100 mL IVPB        3 g 200 mL/hr over 30 Minutes Intravenous Every 6 hours 10/14/20 1754 10/19/20 1340   10/08/20 0845  ciprofloxacin (CIPRO) tablet 500 mg  Status:  Discontinued        500 mg Per Tube 2 times daily 10/08/20 0754 10/14/20 1731   09/21/20 1545  ceFAZolin (ANCEF) IVPB 1 g/50 mL premix        1 g 100 mL/hr over 30 Minutes Intravenous Every 8 hours 09/21/20 1457 09/28/20 0647   09/20/20 0600  ceFAZolin (ANCEF) IVPB 1 g/50 mL premix  Status:  Discontinued        1 g 100 mL/hr over 30 Minutes Intravenous Every 8 hours 09/14/20 0903 09/14/20 0904   09/16/20 1300  vancomycin (VANCOREADY) IVPB 1250 mg/250 mL  Status:  Discontinued        1,250 mg 166.7 mL/hr over 90 Minutes Intravenous Every 24 hours 09/15/20 1232 09/17/20 0956   09/15/20 1230  vancomycin (VANCOREADY) IVPB 1250 mg/250 mL        1,250 mg 166.7 mL/hr over 90 Minutes Intravenous  Once 09/15/20 1131 09/15/20 1402   09/13/20 1400  ceFEPIme (MAXIPIME) 2 g in sodium chloride 0.9 % 100 mL IVPB        2 g 200 mL/hr over 30 Minutes Intravenous Every 8 hours 09/13/20 1341 09/19/20 2208   09/06/20 0600  vancomycin (VANCOREADY) IVPB 1250 mg/250 mL        1,250 mg 166.7 mL/hr over 90 Minutes Intravenous  Once 09/06/20 0541 09/06/20 0739   09/05/20 1400  ceFAZolin (ANCEF) IVPB 1 g/50 mL premix  Status:  Discontinued        1 g 100 mL/hr over 30 Minutes Intravenous Every 8 hours 09/05/20 1120 09/13/20 1341   08/30/20 0600  vancomycin (VANCOREADY) IVPB 500 mg/100 mL  Status:  Discontinued        500 mg 100 mL/hr over 60 Minutes Intravenous Every 12 hours 08/29/20 1554 09/01/20 1115   08/29/20 1700  ceFEPIme (MAXIPIME) 2 g in sodium chloride 0.9 % 100 mL IVPB  Status:  Discontinued        2 g 200 mL/hr over 30 Minutes Intravenous Every 12 hours 08/29/20 1554 09/01/20 1115    08/29/20 1645  vancomycin (VANCOREADY) IVPB 1250 mg/250 mL        1,250 mg 166.7 mL/hr over 90 Minutes Intravenous  Once 08/29/20 1554 08/29/20 1820       Time spent: 25 minutes    Erin Hearing ANP  [facility] 7 am - 330 pm/M-F for direct patient care and secure chat Please refer to Amion for contact info 64  days

## 2020-10-30 NOTE — Progress Notes (Signed)
Patient ID: Charlene [name], female   DOB: [birth year], 55 y.o.   MRN: [id number] Attestation of Attending Supervision of Advanced Practitioner (PA/CNM/NP): Please see detailed note from NP today. Patient seen and examined separately. I have no further details to add. I spoke with the patient's mother at bedside today. Day 4/5 of Meropenam. Slowly waking up.  Reva Bores, MD [facility] 10/30/2020 3:31 PM

## 2020-10-31 LAB — GLUCOSE, CAPILLARY
Glucose-Capillary: 121 mg/dL — ABNORMAL HIGH (ref 70–99)
Glucose-Capillary: 120 mg/dL — ABNORMAL HIGH (ref 70–99)
Glucose-Capillary: 147 mg/dL — ABNORMAL HIGH (ref 70–99)
Glucose-Capillary: 96 mg/dL (ref 70–99)
Glucose-Capillary: 121 mg/dL — ABNORMAL HIGH (ref 70–99)

## 2020-10-31 NOTE — Progress Notes (Signed)
Patient ID: Charlene [name], female   DOB: [birth year], 55 y.o.   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 Subsequently on 2/10, she developed obtundation and respiratory arrest leading to intubation. She has remained persistently obtunded, now has a trach and a PEG. Hospital course complicated by persistent fever,Serratia pneumoniaand now pre-orbital cellulitis  As of 3/25 with adjustments and hypertonicity medications patient seems to be improving.  She is also waking up more although limited interaction but both eyes are now open and she tracks with the left eye.  Multiple discussions with husband who has confirmed he has not delegated other family members to be POA and he is the primary legal contact for patient.  He has agreed to let patient's daughter take the lead on completing and following up on Medicaid and disability applications including having these applications sent to her address.  2/09 Admit for [place] 2/10 EVD placed, later intubated due to obtundation and respiratory arrest 2/11 MRI Brain, hypertonic saline d/c'd, EVD not working. Husband did not consent to PICC  2/13 EVD removed 2/17 [place] discussion with family, PMT 2/24 Bedside trach and PEG 3/1 Transferred to [facility]:   Principal Problem:   ICH (intracerebral hemorrhage) ([place]) Active Problems:   Intraparenchymal hemorrhage of brain (HCC)   Acute hypoxemic respiratory failure (HCC)   Fever   Endotracheal tube present   Status post tracheostomy ([place])   Palliative care by specialist   Muscle hypertonicity   Oropharyngeal dysphagia   Brain injury with loss of consciousness ([place])   Poor prognosis   Aspiration pneumonia of right lower lobe (HCC)    Gram-negative infection   Abnormal urinalysis   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 Intraventricular penetration with blood filling the 3rd and forth ventricles and nearly filling the right lateral ventricle.  -MRI 2/11 with similar findings as well ateffacement of prepontine cistern and similar 4 mm of leftward midline shift. Separate areas of ristricted diffusion and edema involving the right greater than left mesial temporal lobes / parahippocampal cortex, bilateral basal ganglia, and possibly the upper pons that are concerning for acute infarcts, most likely 2/2 mass effect from the hemorrhage (see report)/  -2/19 MRI with acute to early subacute infarcts in the bilateral cerebral white matter and right cerebellum, new from 2/11 - interval evolution of multiple other infarcts present on prior MRI, increased hemorrhage along old right EVD tract (see report) - 2/21 head CT with expected evolution of ICH since 2/13 - Echo with normal EF, no intracardiac source of embolism - CTA head/neck without intracranial arterial occlusion or high grade stenosis, no dissection, aneurysm, or hemodynamically significant stenosis of the carotid or vertebral arteries - Head CT 3/9 with expected evolution of hemorrhage as above - no antithrombotic 2/2 ICH -Obstructive hydrocephalus -> s/p external ventricular catheter placement on 08/27/20 by neurosurgery - EVD clamped on 2/12 and d/c'd on 2/13 -EEG suggestive of cortical dysfunction in right hemisphere likely 2/2 underlying bleed as well as severe diffuse encephalopathy,  nonspecific etiology . -Continue baclofen 5 mg at 8 AM and  4 PM and 7.5 mg at bedtime.  LFTs remain stable with baseline subtle elevation without change since initiation of baclofen. -Continue Provigil 200 mg daily as recommended by PMR team -Add scheduled Senokot at HS given requirement for SMRs -OOB to chair daily and PROM every 4 hours per nursing staff -Per OT recommendations have made staff aware that patient needs to move to outside of the hall to improve visual-spatial interaction and acuity.  ESBL positive Enterobacter UTI -> 100,000 colonies-sensitive to gentamicin imipenem, resistant to cefazolin, cefepime Septra and intermediate to Cipro, nitrofurantoin energetically. -Continue meropenem for total of 7 days. -DC Purewick during acute UTI -Contact isolation -Continue Myrbetriq for suspected bladder spasm  Tachycardia/suboptimal BP -Resolved -Was given 1L of NS and vfree water increased fom 100 c q 4 hrs to 200 cc  -HR and BP improved -Norvasc was decreased to 2.5 mg 4/8  Neck Contracture/torticollis -Preference for keeping head turned to the left. -Continue baclofen as above -- continue PT/OT weekly per their recommendations given patient's inability to effectively participate at this juncture.  Nursing staff performing PROM activities.  Family also instructed and has been performing intermittently.  Hypertensive emergency/Malignant HTN Resolved -Home medications include Coreg and HCTZ which have not been reordered -Continue low-dose Norvasc as well as metoprolol  Acute hypoxemic respiratory failure secondary to CVA requiring mechanical ventilation and tracheostomy tube -Stable on trach collar and has been transitioned to a #6 cuffless trach -Has significant altered mentation and inability to manage oral secretions at present time is not a safe candidate for decannulation.  Discussed this with family as major obstacle in discharging to home.  This will also decrease options for SNF placement. -Appreciate trach team assistance in  management  Pain likely related to underlying hypertonicity -Significantly improved -Morphine IV, oxycodone and Tylenol prn (watch elev LFTs) -Continue baclofen as above  Dysphagia/Nutrition Status: \Tube feeding consisting of Osmolite 1.2 Cal at 55 cc/h; continue Prosource liquid 45 cc daily, free water 100 cc every 4 hours per tube; will also continue to follow CBGs regularly while on tube feedings and provide SSI If patient eventually discharged to home to teach family how to bolus feed as opposed to utilizing continuous tube feeding infusions.  Anasarca -bilateral upper extremity edema, minimal LE edema - suspect related to prolonged hospitalization, hypoalbuminemia -upper extremity US-> age indeterminate SVT of right cephalic vein  Elevated LFT's -Remain elevated but stable without any obstructive pattern -Continue baclofen and statin  Bacterial Conjunctivitis/periorbital cellulitis - MRI 3/7 orbits with L periorbital cellulitis  -- CT head 3/9 with persistent subtotal opacification of paranasal sinuses and mastoid air cells, bubbly lucency at base of R maxillary sinus (likely odontogenic in origin) - CT maxillofacial 3/12- improving cellulitis  - s/p erythromycin ointment -completed course ofAncef   Chronic pansinusitis/recurrent fevers 2/2 aspiration PNA;History of Serratia Pneumonia  Hospital Acquired Pneumonia:  -Tracheal aspirate from 2/25 and 3/1 with serratia - cefepime 2/27-3/5; repeat sputum cx 3/21 + serratia (likely colonization but w/ recurrent fever opted to treat) -Has completed antibiotics recently for bilateral orbital cellulitis -started on Cipro for tracheal aspirate positive for Serratia marcescens   -Repeat sinus CT demonstrates progressive complete bilateral maxillary sinus opacification with unchanged complete left sphenoid sinus opacification and right sphenoid sinus fluid c/w chronic state  -CT Chest 3/30 with aspiration PNA-dtr updated at  bedside by attending MD- Cipro changed to Unasyn IV-Unasyn now completed -no increased O2 needs  Anemia -Stable, follow    DVT prophylaxis: SCD/Compression stockings Code Status: Full code  Family Communication: Husband at bedside Disposition Plan: Awaiting SNF placement   Consultants:   Neurology  PM&R  General surgery  Palliative care  PCCM  Neurosurgery  Procedures:  EEG  Tracheostomy  Bronchoscopy  PEG placement  Right frontal ventricular catheter  Antimicrobials: Anti-infectives (From admission, onward)   Start     Dose/Rate Route Frequency Ordered Stop   10/27/20 0845  meropenem (MERREM) 1 g in sodium chloride 0.9 % 100 mL IVPB        1 g 200 mL/hr over 30 Minutes Intravenous Every 8 hours 10/27/20 0752     10/26/20 1145  cefTRIAXone (ROCEPHIN) 1 g in sodium chloride 0.9 % 100 mL IVPB  Status:  Discontinued        1 g 200 mL/hr over 30 Minutes Intravenous Every 24 hours 10/26/20 1056 10/27/20 0752   10/14/20 1815  Ampicillin-Sulbactam (UNASYN) 3 g in sodium chloride 0.9 % 100 mL IVPB        3 g 200 mL/hr over 30 Minutes Intravenous Every 6 hours 10/14/20 1754 10/19/20 1340   10/08/20 0845  ciprofloxacin (CIPRO) tablet 500 mg  Status:  Discontinued        500 mg Per Tube 2 times daily 10/08/20 0754 10/14/20 1731   09/21/20 1545  ceFAZolin (ANCEF) IVPB 1 g/50 mL premix        1 g 100 mL/hr over 30 Minutes Intravenous Every 8 hours 09/21/20 1457 09/28/20 0647   09/20/20 0600  ceFAZolin (ANCEF) IVPB 1 g/50 mL premix  Status:  Discontinued        1 g 100 mL/hr over 30 Minutes Intravenous Every 8 hours 09/14/20 0903 09/14/20 0904   09/16/20 1300  vancomycin (VANCOREADY) IVPB 1250 mg/250 mL  Status:  Discontinued        1,250 mg 166.7 mL/hr over 90 Minutes Intravenous Every 24 hours 09/15/20 1232 09/17/20 0956   09/15/20 1230  vancomycin (VANCOREADY) IVPB 1250 mg/250 mL        1,250 mg 166.7 mL/hr over 90 Minutes Intravenous  Once 09/15/20 1131  09/15/20 1402   09/13/20 1400  ceFEPIme (MAXIPIME) 2 g in sodium chloride 0.9 % 100 mL IVPB        2 g 200 mL/hr over 30 Minutes Intravenous Every 8 hours 09/13/20 1341 09/19/20 2208   09/06/20 0600  vancomycin (VANCOREADY) IVPB 1250 mg/250 mL        1,250 mg 166.7 mL/hr over 90 Minutes Intravenous  Once 09/06/20 0541 09/06/20 0739   09/05/20 1400  ceFAZolin (ANCEF) IVPB 1 g/50 mL premix  Status:  Discontinued        1 g 100 mL/hr over 30 Minutes Intravenous Every 8 hours 09/05/20 1120 09/13/20 1341   08/30/20 0600  vancomycin (VANCOREADY) IVPB 500 mg/100 mL  Status:  Discontinued        500 mg 100 mL/hr over 60 Minutes Intravenous Every 12 hours 08/29/20 1554 09/01/20 1115   08/29/20 1700  ceFEPIme (MAXIPIME) 2 g in sodium chloride 0.9 % 100 mL IVPB  Status:  Discontinued        2 g 200 mL/hr over 30 Minutes Intravenous Every 12 hours 08/29/20 1554 09/01/20 1115   08/29/20 1645  vancomycin (VANCOREADY) IVPB 1250 mg/250 mL        1,250 mg 166.7 mL/hr over 90 Minutes Intravenous  Once 08/29/20 1554 08/29/20 1820       Subjective:  No significant change today.  She seems to be scratching at her right inner thigh  Objective: Vitals:   10/31/20 0427 10/31/20 0500 10/31/20 0740 10/31/20 0856  BP: 132/84   125/80  Pulse: 83  85 89  Resp: (!) '21  16 19  ' Temp: 98.9 F (37.2 C)   98.8 F (37.1 C)  TempSrc: Axillary   Axillary  SpO2: 98%  98% 98%  Weight:  76 kg    Height:        Intake/Output Summary (Last 24 hours) at 10/31/2020 1032 Last data filed at 10/30/2020 1900 Gross per 24 hour  Intake 0 ml  Output --  Net 0 ml   Filed Weights   10/27/20 0349 10/30/20 0500 10/31/20 0500  Weight: 67 kg 76 kg 76 kg    Examination:  General exam: Appears calm and comfortable  Respiratory system: Clear to auscultation. Respiratory effort normal. Cardiovascular system: S1 & S2 heard, RRR.  Gastrointestinal system: Abdomen is nondistended, soft and nontender.  Central nervous system:  Alert and oriented. No focal neurological deficits. Extremities: Symmetric  Skin: No rashes Psychiatry: Judgement and insight appear normal. Mood & affect appropriate.     Data Reviewed: I have personally reviewed following labs and imaging studies  CBG: Recent Labs  Lab 10/30/20 1651 10/30/20 1944 10/30/20 2353 10/31/20 0402 10/31/20 0842  GLUCAP 113* 86 112* 121* 120*     Recent Results (from the past 240 hour(s))  Culture, Urine     Status: Abnormal   Collection Time: 10/23/20  1:20 AM   Specimen: Urine, Catheterized  Result Value Ref Range Status   Specimen Description URINE, CATHETERIZED  Final   Special Requests NONE  Final   Culture (A)  Final    >=100,000 COLONIES/mL ENTEROBACTER CLOACAE Susceptibility Pattern Suggests Possibility of an Extended Spectrum Beta Lactamase Producer. Contact Laboratory Within 7 Days if Confirmation Warranted. Performed at [facility], [place] [address] [place], [place] [address]    Report Status 10/28/2020 FINAL  Final   Organism ID, Bacteria ENTEROBACTER CLOACAE (A)  Final      Susceptibility   Enterobacter cloacae - MIC*    CEFAZOLIN >=64 RESISTANT Resistant     CEFEPIME >=32 RESISTANT Resistant     CIPROFLOXACIN 2 INTERMEDIATE Intermediate     GENTAMICIN <=1 SENSITIVE Sensitive     IMIPENEM 1 SENSITIVE Sensitive     NITROFURANTOIN 64 INTERMEDIATE Intermediate     TRIMETH/SULFA >=320 RESISTANT Resistant     PIP/TAZO 32 INTERMEDIATE Intermediate     * >=100,000 COLONIES/mL ENTEROBACTER CLOACAE      Radiology Studies: No results found.   Scheduled Meds: . amLODipine  2.5 mg Per Tube Daily  . atorvastatin  40 mg Per Tube Daily  . baclofen  5 mg Per Tube 2 times per day  . baclofen  7.5 mg Per Tube Daily  . chlorhexidine gluconate (MEDLINE KIT)  15 mL Mouth Rinse BID  . Chlorhexidine Gluconate Cloth  6 each Topical Daily  . enoxaparin (LOVENOX) injection  40 mg Subcutaneous Q24H  . feeding supplement (PROSource TF)   45 mL Per Tube Daily  . free water  100 mL Per Tube Q4H  . hydrALAZINE  50 mg Per Tube Q8H  . insulin aspart  0-9 Units Subcutaneous Q4H  . mouth rinse  15 mL Mouth Rinse 5 X Daily  . metoprolol tartrate  100 mg Per Tube BID  . mirabegron ER  25 mg Oral Daily  . modafinil  200 mg Per Tube Daily  . pantoprazole sodium  40 mg Per Tube BID  . scopolamine  1 patch Transdermal Q72H  . sennosides  5 mL Per Tube QHS   Continuous Infusions: . sodium chloride Stopped (10/26/20 1445)  . feeding supplement (OSMOLITE 1.2 CAL) 1,000 mL (10/30/20 1745)  . meropenem (MERREM) IV 1 g (10/31/20 0649)     LOS: 70 days    Donnamae Jude, MD 10/31/2020 10:32 AM [phone] [facility] If 7PM-7AM, please contact night-coverage 10/31/2020, 10:32 AM

## 2020-10-31 NOTE — Plan of Care (Signed)
  Problem: Education: Goal: Knowledge of disease or condition will improve Outcome: Progressing Goal: Knowledge of secondary prevention will improve Outcome: Progressing Goal: Knowledge of patient specific risk factors addressed and post discharge goals established will improve Outcome: Progressing   Problem: Coping: Goal: Will verbalize positive feelings about self Outcome: Progressing Goal: Will identify appropriate support needs Outcome: Progressing   Problem: Health Behavior/Discharge Planning: Goal: Ability to manage health-related needs will improve Outcome: Progressing   Problem: Self-Care: Goal: Ability to participate in self-care as condition permits will improve Outcome: Progressing Goal: Verbalization of feelings and concerns over difficulty with self-care will improve Outcome: Progressing Goal: Ability to communicate needs accurately will improve Outcome: Progressing   Problem: Nutrition: Goal: Risk of aspiration will decrease Outcome: Progressing   Problem: Intracerebral Hemorrhage Tissue Perfusion: Goal: Complications of Intracerebral Hemorrhage will be minimized Outcome: Progressing   Problem: Education: Goal: Knowledge of General Education information will improve Description: Including pain rating scale, medication(s)/side effects and non-pharmacologic comfort measures Outcome: Progressing   Problem: Health Behavior/Discharge Planning: Goal: Ability to manage health-related needs will improve Outcome: Progressing   Problem: Clinical Measurements: Goal: Ability to maintain clinical measurements within normal limits will improve Outcome: Progressing Goal: Will remain free from infection Outcome: Progressing Goal: Diagnostic test results will improve Outcome: Progressing Goal: Respiratory complications will improve Outcome: Progressing Goal: Cardiovascular complication will be avoided Outcome: Progressing   Problem: Coping: Goal: Level of anxiety  will decrease Outcome: Progressing   Problem: Elimination: Goal: Will not experience complications related to bowel motility Outcome: Progressing Goal: Will not experience complications related to urinary retention Outcome: Progressing   Problem: Pain Managment: Goal: General experience of comfort will improve Outcome: Progressing   Problem: Skin Integrity: Goal: Risk for impaired skin integrity will decrease Outcome: Progressing   

## 2020-11-01 LAB — GLUCOSE, CAPILLARY
Glucose-Capillary: 103 mg/dL — ABNORMAL HIGH (ref 70–99)
Glucose-Capillary: 107 mg/dL — ABNORMAL HIGH (ref 70–99)
Glucose-Capillary: 120 mg/dL — ABNORMAL HIGH (ref 70–99)
Glucose-Capillary: 127 mg/dL — ABNORMAL HIGH (ref 70–99)
Glucose-Capillary: 161 mg/dL — ABNORMAL HIGH (ref 70–99)
Glucose-Capillary: 168 mg/dL — ABNORMAL HIGH (ref 70–99)
Glucose-Capillary: 97 mg/dL (ref 70–99)

## 2020-11-01 MED ORDER — AMLODIPINE BESYLATE 5 MG PO TABS
5.0000 mg | ORAL_TABLET | Freq: Once | ORAL | Status: AC
  Administered 2020-11-01: 5 mg
  Filled 2020-11-01: qty 1

## 2020-11-01 MED ORDER — AMLODIPINE BESYLATE 10 MG PO TABS
10.0000 mg | ORAL_TABLET | Freq: Every day | ORAL | Status: DC
  Administered 2020-11-02 – 2021-04-16 (×166): 10 mg
  Filled 2020-11-01 (×9): qty 1
  Filled 2020-11-01: qty 2
  Filled 2020-11-01 (×2): qty 1
  Filled 2020-11-01: qty 2
  Filled 2020-11-01 (×42): qty 1
  Filled 2020-11-01: qty 2
  Filled 2020-11-01 (×6): qty 1
  Filled 2020-11-01: qty 2
  Filled 2020-11-01 (×20): qty 1
  Filled 2020-11-01: qty 2
  Filled 2020-11-01 (×18): qty 1
  Filled 2020-11-01: qty 2
  Filled 2020-11-01 (×23): qty 1
  Filled 2020-11-01: qty 4
  Filled 2020-11-01 (×36): qty 1
  Filled 2020-11-01: qty 2
  Filled 2020-11-01 (×4): qty 1

## 2020-11-01 MED ORDER — AMLODIPINE BESYLATE 5 MG PO TABS
5.0000 mg | ORAL_TABLET | Freq: Every day | ORAL | Status: DC
  Administered 2020-11-01: 5 mg
  Filled 2020-11-01: qty 1

## 2020-11-01 NOTE — Plan of Care (Signed)
  Problem: Education: Goal: Knowledge of disease or condition will improve Outcome: Progressing Goal: Knowledge of secondary prevention will improve Outcome: Progressing Goal: Knowledge of patient specific risk factors addressed and post discharge goals established will improve Outcome: Progressing   Problem: Coping: Goal: Will verbalize positive feelings about self Outcome: Progressing Goal: Will identify appropriate support needs Outcome: Progressing   Problem: Health Behavior/Discharge Planning: Goal: Ability to manage health-related needs will improve Outcome: Progressing   Problem: Self-Care: Goal: Ability to participate in self-care as condition permits will improve Outcome: Progressing Goal: Verbalization of feelings and concerns over difficulty with self-care will improve Outcome: Progressing Goal: Ability to communicate needs accurately will improve Outcome: Progressing   Problem: Nutrition: Goal: Risk of aspiration will decrease Outcome: Progressing   Problem: Intracerebral Hemorrhage Tissue Perfusion: Goal: Complications of Intracerebral Hemorrhage will be minimized Outcome: Progressing   Problem: Education: Goal: Knowledge of General Education information will improve Description: Including pain rating scale, medication(s)/side effects and non-pharmacologic comfort measures Outcome: Progressing   Problem: Health Behavior/Discharge Planning: Goal: Ability to manage health-related needs will improve Outcome: Progressing   Problem: Clinical Measurements: Goal: Ability to maintain clinical measurements within normal limits will improve Outcome: Progressing Goal: Will remain free from infection Outcome: Progressing Goal: Diagnostic test results will improve Outcome: Progressing Goal: Respiratory complications will improve Outcome: Progressing Goal: Cardiovascular complication will be avoided Outcome: Progressing   Problem: Coping: Goal: Level of anxiety  will decrease Outcome: Progressing   Problem: Elimination: Goal: Will not experience complications related to bowel motility Outcome: Progressing Goal: Will not experience complications related to urinary retention Outcome: Progressing   Problem: Pain Managment: Goal: General experience of comfort will improve Outcome: Progressing   Problem: Skin Integrity: Goal: Risk for impaired skin integrity will decrease Outcome: Progressing

## 2020-11-01 NOTE — Progress Notes (Signed)
[facility] PROGRESS NOTE    Charlene [name]  TNB:[id number] DOB: 1966/07/12 DOA: 08/26/2020 PCP: Patient, [name] (Inactive)      Brief Narrative:  Charlene [name] is a 55 y.o. F with HTN who presented after developing left-sided weakness, headaches, being progressively more confused, lethargic and with slurred speech.  In the ER, she was called a code stroke, and CT head showed right thalamic/basal ganglia hemorrhage with intraventricular extension, she was transferred emergently to the intensive care unit for drain placement.   Subsequent hospitalization complicated by obtundation and respiratory arrest and persistent encephalopathy.  Now with tracheostomy and PEG tube.  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 - Continue meropenem, day 5 of 5 - Continue mirabegron - Continue PPI   Anemia of critical illness  Dysphagia due to acute on persistent metabolic encephalopathy - Continue tube feeds, free water, insulin correction         Disposition: Status is: Inpatient  Remains inpatient appropriate because:Unsafe d/c plan and altered mental status   Dispo: The  patient is from: Home              Anticipated d/c is to: SNF              Patient currently is medically stable to d/c.   Difficult to place patient Yes       Level of care: Progressive       MDM: The below labs and imaging reports were reviewed and summarized above.  Medication management as above.    DVT prophylaxis: enoxaparin (LOVENOX) injection 40 mg Start: 09/01/20 1600 SCD's Start: 08/27/20 0026  Code Status: FULL Family Communication: Son at bedisde          Subjective: No new fever, respiratory distress.  No obvious pain.  Objective: Vitals:   11/01/20 0821 11/01/20 1135 11/01/20 1157 11/01/20 1216  BP: (!) 164/98 (!) 162/97  (!) 162/97  Pulse: 97  86 85  Resp: '20  20 15  ' Temp: 98.8 F (37.1 C)     TempSrc: Axillary     SpO2:   100% 98%  Weight:      Height:        Intake/Output Summary (Last 24 hours) at 11/01/2020 1435 Last data filed at 10/31/2020 2105 Gross per 24 hour  Intake 150 ml  Output --  Net 150 ml   Filed Weights   10/27/20 0349 10/30/20 0500 10/31/20 0500  Weight: 67 kg 76 kg 76 kg    Examination: General appearance:  Adult female lying in bed, not responsive, staring HEENT: Anicteric, conjunctiva pink, lids and lashes normal.  Tracheostomy clean and dry Skin: Warm and dry.  No jaundice.  No suspicious rashes or lesions. Cardiac: Tachycardic, regular, nl S1-S2, no murmurs appreciated.  Capillary refill is brisk.  JVP normal, no lower extremity edema. Respiratory: Normal respiratory rate and  rhythm.  CTAB without rales or wheezes. Abdomen: Abdomen soft.  No TTP or guarding. No ascites, distension, hepatosplenomegaly.   MSK: No deformities or effusions. Neuro: Appears to be awake, but no spontaneous verbalizations and no purposeful movements.  She is grasping with her right hand, the left side is contractured, there is no purposeful movements of the eyes.    Psych: None.    Data Reviewed: I have personally reviewed  following labs and imaging studies:  CBC: No results for input(s): WBC, NEUTROABS, HGB, HCT, MCV, PLT in the last 168 hours. Basic Metabolic Panel: No results for input(s): NA, K, CL, CO2, GLUCOSE, BUN, CREATININE, CALCIUM, MG, PHOS in the last 168 hours. GFR: Estimated Creatinine Clearance: 82.9 mL/min (by C-G formula based on SCr of 0.6 mg/dL). Liver Function Tests: No results for input(s): AST, ALT, ALKPHOS, BILITOT, PROT, ALBUMIN in the last 168 hours. No results for input(s): LIPASE, AMYLASE in the last 168 hours. No results for input(s): AMMONIA in the last 168 hours. Coagulation Profile: No results for input(s): INR, PROTIME in the last 168 hours. Cardiac Enzymes: No results for input(s): CKTOTAL, CKMB, CKMBINDEX, TROPONINI in the last 168 hours. BNP (last 3 results) No results for input(s): PROBNP in the last 8760 hours. HbA1C: No results for input(s): HGBA1C in the last 72 hours. CBG: Recent Labs  Lab 10/31/20 2002 11/01/20 0013 11/01/20 0343 11/01/20 0815 11/01/20 1215  GLUCAP 121* 103* 107* 127* 168*   Lipid Profile: No results for input(s): CHOL, HDL, LDLCALC, TRIG, CHOLHDL, LDLDIRECT in the last 72 hours. Thyroid Function Tests: No results for input(s): TSH, T4TOTAL, FREET4, T3FREE, THYROIDAB in the last 72 hours. Anemia Panel: No results for input(s): VITAMINB12, FOLATE, FERRITIN, TIBC, IRON, RETICCTPCT in the last 72 hours. Urine analysis:    Component Value Date/Time   COLORURINE YELLOW 10/23/2020 1912   APPEARANCEUR CLOUDY (A) 10/23/2020 1912   LABSPEC 1.013 10/23/2020 1912   PHURINE 6.0 10/23/2020 1912   GLUCOSEU NEGATIVE 10/23/2020 1912   HGBUR NEGATIVE 10/23/2020 1912   BILIRUBINUR NEGATIVE 10/23/2020 1912   KETONESUR NEGATIVE 10/23/2020 1912   PROTEINUR NEGATIVE 10/23/2020 1912   UROBILINOGEN 0.2 02/21/2014 1629   NITRITE NEGATIVE 10/23/2020 1912   LEUKOCYTESUR NEGATIVE 10/23/2020 1912   Sepsis  Labs: '@LABRCNTIP' (procalcitonin:4,lacticacidven:4)  ) Recent Results (from the past 240 hour(s))  Culture, Urine     Status: Abnormal   Collection Time: 10/23/20  1:20 AM   Specimen: Urine, Catheterized  Result Value Ref Range Status   Specimen Description URINE, CATHETERIZED  Final   Special Requests NONE  Final   Culture (A)  Final    >=100,000 COLONIES/mL ENTEROBACTER CLOACAE Susceptibility Pattern Suggests Possibility of an Extended Spectrum Beta Lactamase Producer. Contact Laboratory Within 7 Days if Confirmation Warranted. Performed at [facility], [place] [address] [place], [place] [address]    Report Status 10/28/2020 FINAL  Final   Organism ID, Bacteria ENTEROBACTER CLOACAE (A)  Final      Susceptibility   Enterobacter cloacae - MIC*    CEFAZOLIN >=64 RESISTANT Resistant     CEFEPIME >=32 RESISTANT Resistant     CIPROFLOXACIN 2 INTERMEDIATE Intermediate     GENTAMICIN <=1 SENSITIVE Sensitive     IMIPENEM 1 SENSITIVE Sensitive     NITROFURANTOIN 64 INTERMEDIATE Intermediate     TRIMETH/SULFA >=320 RESISTANT Resistant     PIP/TAZO 32 INTERMEDIATE Intermediate     * >=100,000 COLONIES/mL ENTEROBACTER CLOACAE         Radiology Studies: No results  found.      Scheduled Meds: . amLODipine  5 mg Per Tube Daily  . atorvastatin  40 mg Per Tube Daily  . baclofen  5 mg Per Tube 2 times per day  . baclofen  7.5 mg Per Tube Daily  . chlorhexidine gluconate (MEDLINE KIT)  15 mL Mouth Rinse BID  . Chlorhexidine Gluconate Cloth  6 each Topical Daily  . enoxaparin (LOVENOX) injection  40 mg Subcutaneous Q24H  . feeding supplement (PROSource TF)  45 mL Per Tube Daily  . free water  100 mL Per Tube Q4H  . hydrALAZINE  50 mg Per Tube Q8H  . insulin aspart  0-9 Units Subcutaneous Q4H  . mouth rinse  15 mL Mouth Rinse 5 X Daily  . metoprolol tartrate  100 mg Per Tube BID  . mirabegron ER  25 mg Oral Daily  . modafinil  200 mg Per Tube Daily  . pantoprazole sodium  40  mg Per Tube BID  . scopolamine  1 patch Transdermal Q72H  . sennosides  5 mL Per Tube QHS   Continuous Infusions: . sodium chloride Stopped (10/26/20 1445)  . feeding supplement (OSMOLITE 1.2 CAL) 1,000 mL (10/30/20 1745)     LOS: 66 days    Time spent: 25 minutes    Edwin Dada, MD [facility] 11/01/2020, 2:35 PM     Please page though [place] or Epic secure chat:  For [employer], Adult nurse

## 2020-11-02 LAB — GLUCOSE, CAPILLARY
Glucose-Capillary: 120 mg/dL — ABNORMAL HIGH (ref 70–99)
Glucose-Capillary: 127 mg/dL — ABNORMAL HIGH (ref 70–99)
Glucose-Capillary: 129 mg/dL — ABNORMAL HIGH (ref 70–99)
Glucose-Capillary: 131 mg/dL — ABNORMAL HIGH (ref 70–99)
Glucose-Capillary: 134 mg/dL — ABNORMAL HIGH (ref 70–99)
Glucose-Capillary: 168 mg/dL — ABNORMAL HIGH (ref 70–99)

## 2020-11-02 LAB — BASIC METABOLIC PANEL
Anion gap: 8 (ref 5–15)
BUN: 16 mg/dL (ref 6–20)
CO2: 29 mmol/L (ref 22–32)
Calcium: 9.5 mg/dL (ref 8.9–10.3)
Chloride: 101 mmol/L (ref 98–111)
Creatinine, Ser: 0.5 mg/dL (ref 0.44–1.00)
GFR, Estimated: >60 mL/min (ref >60)
Glucose, Bld: 113 mg/dL — ABNORMAL HIGH (ref 70–99)
Potassium: 4 mmol/L (ref 3.5–5.1)
Sodium: 138 mmol/L (ref 135–145)

## 2020-11-02 LAB — CBC
HCT: 34.8 % — ABNORMAL LOW (ref 36.0–46.0)
Hemoglobin: 10.8 g/dL — ABNORMAL LOW (ref 12.0–15.0)
MCH: 25.3 pg — ABNORMAL LOW (ref 26.0–34.0)
MCHC: 31 g/dL (ref 30.0–36.0)
MCV: 81.5 fL (ref 80.0–100.0)
Platelets: 453 10*3/uL — ABNORMAL HIGH (ref 150–400)
RBC: 4.27 MIL/uL (ref 3.87–5.11)
RDW: 16.3 % — ABNORMAL HIGH (ref 11.5–15.5)
WBC: 6.1 10*3/uL (ref 4.0–10.5)
nRBC: 0 % (ref 0.0–0.2)

## 2020-11-02 NOTE — Progress Notes (Signed)
PROGRESS NOTE    Charlene [name]  HKV:[id number] DOB: [birth year] DOA: 08/26/2020 PCP: Patient, [name] (Inactive)    Brief Narrative:  55 year old female with prolonged hospitalization, admitted for right thalamic/basal ganglia hemorrhagic cerebrovascular accident with intraventricular extension. Patient with severe deconditioning status post trach and PEG.  55 year old female past medical history for hypertension, presented to [place] with left-sided weakness and headaches.  She complained of left numbness on the left side, the rapidly progressing to severe weakness, associated with slurred speech.  She was noted to be confused and lethargic.  She was taken to the ER, she was diagnosed with a right thalamic/basal ganglia hemorrhage with intraventricular extension.  She was transferred to [facility] for further evaluation.  Her vital signs at the time of her transfer, blood pressure 145/76, pulse rate 72, temperature 98, respiratory rate 33, oxygen saturation 100%.  She was very somnolent and lethargic, not following commands, nonverbal, her lungs are clear to auscultation bilaterally, heart S1-S2, present rhythmic, soft abdomen, no lower extremity edema.  Patient had progressive altered mentation, required an advanced airway, she was intubated and placed on mechanical ventilation.  Patient underwent a EVD placement on 2/10, removed 2/13.  2/24 bedside t chest racheostomy, (Shiley #6 uncuffed), PEG placement.  Hospitalization complicated by Serratia pneumonia, ESBL enterobacter UTI and 3 orbital cellulitis.  03/01 transferred to [facility]  Assessment & Plan:   Principal Problem:   ICH (intracerebral hemorrhage) (HCC) Active Problems:   Intraparenchymal hemorrhage of brain (HCC)   Acute hypoxemic respiratory failure (HCC)   Fever   Endotracheal tube present   Status post tracheostomy ([place])   Palliative care by specialist   Muscle hypertonicity   Oropharyngeal dysphagia    Brain injury with loss of consciousness ([place])   Poor prognosis   Aspiration pneumonia of right lower lobe (HCC)   Gram-negative infection   Abnormal urinalysis   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   Acute cystitis without hematuria    1. Acute CVA, right thalamus, ICH with IVH. Obstructive hydrocephalus.  Patient continue not interactive.   Continue supportive medical therapy, at this point pending placement at SNF to continue care/  On statin therapy.  Continue baclofen.   2. Hypertensive emergency. Continue blood pressure control with amlodipine, hydralazine and metoprolol    3. ESBL enterobacter UTI (not present on admission) completed antibiotic therapy.   4. Acute hypoxemic respiratory failure/ hospital acquired pneumonia serratia.  Sp trach Continue trach care, patient tolerating well trach collar, continue with aspiration precautions.   5. Bacterial conjunctivitis/ periorbital cellulitis clinically resolved, patient completed antibiotic therapy.   6. Anemia stable cell count, no need for PRBC transfusion at this point in time.   7. Dysphagia sp PEG Continue nutritional support with tube feedings.   8. T2DM. Continue glucose control and monitoring with insulin sliding scale.   Status is: Inpatient  Remains inpatient appropriate because:Inpatient level of care appropriate due to severity of illness   Dispo: The patient is from: Home              Anticipated d/c is to: SNF              Patient currently is medically stable to d/c.   Difficult to place patient No    DVT prophylaxis: scd   Code Status:   full  Family Communication:  I spoke with patient's husband at the bedside, we talked in detail about patient's condition, plan  of care and prognosis and all questions were addressed.      Nutrition Status: Nutrition Problem: Inadequate oral intake Etiology: inability to eat Signs/Symptoms: NPO  status Interventions: Tube feeding     Skin Documentation:     Consultants:   Neurology   PCCM  IR   Neurosurgery   Procedures:   EVD  PEG  Trach    Subjective: Patient tolerating well tube feedings and trach. Not interactive.   Objective: Vitals:   11/02/20 0758 11/02/20 1024 11/02/20 1127 11/02/20 1405  BP:   137/89   Pulse:   80   Resp: 14 16 17 14   Temp:   97.6 F (36.4 C)   TempSrc:   Axillary   SpO2:      Weight:      Height:       No intake or output data in the 24 hours ending 11/02/20 1547 Filed Weights   10/27/20 0349 10/30/20 0500 10/31/20 0500  Weight: 67 kg 76 kg 76 kg    Examination:   General: Not in pain or dyspnea. Deconditioned  Neurology: Awake and alert, non focal  E ENT: no pallor, no icterus, oral mucosa moist, trach in place.  Cardiovascular: No JVD. S1-S2 present, rhythmic, no gallops, rubs, or murmurs. No lower extremity edema. Pulmonary: positive breath sounds bilaterally, adequate air movement, no wheezing, rhonchi or rales. Gastrointestinal. Abdomen soft and non tender Skin. No rashes Musculoskeletal: no joint deformities     Data Reviewed: I have personally reviewed following labs and imaging studies  CBC: Recent Labs  Lab 11/02/20 0433  WBC 6.1  HGB 10.8*  HCT 34.8*  MCV 81.5  PLT 453*   Basic Metabolic Panel: Recent Labs  Lab 11/02/20 0433  NA 138  K 4.0  CL 101  CO2 29  GLUCOSE 113*  BUN 16  CREATININE 0.50  CALCIUM 9.5   GFR: Estimated Creatinine Clearance: 82.9 mL/min (by C-G formula based on SCr of 0.5 mg/dL). Liver Function Tests: No results for input(s): AST, ALT, ALKPHOS, BILITOT, PROT, ALBUMIN in the last 168 hours. No results for input(s): LIPASE, AMYLASE in the last 168 hours. No results for input(s): AMMONIA in the last 168 hours. Coagulation Profile: No results for input(s): INR, PROTIME in the last 168 hours. Cardiac Enzymes: No results for input(s): CKTOTAL, CKMB, CKMBINDEX,  TROPONINI in the last 168 hours. BNP (last 3 results) No results for input(s): PROBNP in the last 8760 hours. HbA1C: No results for input(s): HGBA1C in the last 72 hours. CBG: Recent Labs  Lab 11/01/20 2002 11/01/20 2346 11/02/20 0436 11/02/20 0731 11/02/20 1126  GLUCAP 120* 161* 120* 127* 129*   Lipid Profile: No results for input(s): CHOL, HDL, LDLCALC, TRIG, CHOLHDL, LDLDIRECT in the last 72 hours. Thyroid Function Tests: No results for input(s): TSH, T4TOTAL, FREET4, T3FREE, THYROIDAB in the last 72 hours. Anemia Panel: No results for input(s): VITAMINB12, FOLATE, FERRITIN, TIBC, IRON, RETICCTPCT in the last 72 hours.    Radiology Studies: I have reviewed all of the imaging during this hospital visit personally     Scheduled Meds: . amLODipine  10 mg Per Tube Daily  . atorvastatin  40 mg Per Tube Daily  . baclofen  5 mg Per Tube 2 times per day  . baclofen  7.5 mg Per Tube Daily  . chlorhexidine gluconate (MEDLINE KIT)  15 mL Mouth Rinse BID  . Chlorhexidine Gluconate Cloth  6 each Topical Daily  . enoxaparin (LOVENOX) injection  40 mg Subcutaneous Q24H  .  feeding supplement (PROSource TF)  45 mL Per Tube Daily  . free water  100 mL Per Tube Q4H  . hydrALAZINE  50 mg Per Tube Q8H  . insulin aspart  0-9 Units Subcutaneous Q4H  . mouth rinse  15 mL Mouth Rinse 5 X Daily  . metoprolol tartrate  100 mg Per Tube BID  . mirabegron ER  25 mg Oral Daily  . modafinil  200 mg Per Tube Daily  . pantoprazole sodium  40 mg Per Tube BID  . scopolamine  1 patch Transdermal Q72H  . sennosides  5 mL Per Tube QHS   Continuous Infusions: . sodium chloride Stopped (10/26/20 1445)  . feeding supplement (OSMOLITE 1.2 CAL) 1,000 mL (11/01/20 1733)     LOS: 67 days        Inessa Wardrop Gerome Apley, MD

## 2020-11-02 NOTE — Progress Notes (Addendum)
NAME:  Charlene [name], MRN:  [id number], DOB:  03-19-1966, LOS: 67 ADMISSION DATE:  08/26/2020, CONSULTATION DATE:  2/10 REFERRING MD:  Dr. Roda Shutters, CHIEF COMPLAINT:  ICH   Brief History:  55 year old female admitted with hypertensive R thalamic/basal ganglia ICH. Resultant obstructive hydrocephalus prompting EVD placement 2/10. Subsequently, 2/10 she had acute mental status change to obtundation and respiratory arrest resulting in intubation. Trach and Peg 2/24. Trach change to #6 cuffless on 3/11.   Past Medical History:  HTN, medication noncompliance  Significant Hospital Events:  2/09 Admit for ICH 2/10 EVD placed, later intubated due to obtundation and respiratory arrest 2/11 MRI Brain, hypertonic saline d/c'd, EVD not working.  Husband did not consent to PICC  2/13 EVD removed 2/17 GOC discussion with family, PMT 3/11 trach changed to Shiley 6 uncuffed 4/18 28% ATC / 5L   Consults:  Stroke PCCM Neurosurgery  Procedures:  ETT 2/10 >> 09/10/2020 EVD 2/10 > non functioning 2/11 at 1500; clamped 2/12 am > 2/13 Trach (Dr. Denese Killings) 2/24 >>  PEG placement (Dr. Sheliah Hatch) 2/24 >>   Significant Diagnostic Tests:  CT Head 2/9 >> Acute hemorrhage with the epicenter in the right thalamus. Measurement is approximately 3.2 x 2.1 x 2.5 cm (volume 8.8 cm^3). Intraventricular penetration with blood filling the third and fourth ventricles and nearly filling the right lateral ventricle. No hydrocephalus. CTA Head/Neck 2/10 >> Unchanged size of intraparenchymal hemorrhage centered in the right thalamus and basal ganglia with extension into the right lateral ventricle. 2. No intracranial arterial occlusion or high-grade stenosis. No dissection, aneurysm or hemodynamically significant stenosis of the carotid or vertebral arteries. CT Head 2/10 >> Stable parenchymal hemorrhage in the right thalamus and basal ganglia with intraventricular extension as described. No new focal abnormality is noted. CT  Head 2/10 (post intubation) >> redemonstrated intraparenchymal hemorrhage centered within the R thalamus/basal ganglia extending into the cerebral peduncle/midbrain with associated intraventricular extension and hemorrhage in the R lateral ventricle and fourth ventricle, overall not significantly changed from prior; stable mass effect, 47mm of R to L midline shift, postsurgical changes of ventriculostomy TTE 2/10 >> LVEF 60 to 65%, LV has normal function, no regional wall motion abnormalities, mild concentric LVH.  RV systolic function is normal, normal pulmonary artery systolic pressure.  MRA head 2/11 >>Negative intracranial MR angiography. No evidence of aneurysm or high flow vascular malformation to explain the intraparenchymal hemorrhage  MRI brain 2/11 >> Similar intraparenchymal hemorrhage centered within the right thalamus and basal ganglia extending into the right midbrain with intraventricular extension, effacement of the prepontine cistern and similar 4 mm of leftward midline shift; separate areas of restricted diffusion and edema involving the R > L mesial temporal lobes/parahippocampal cortex, bilateral basal ganglia, and possibly the upper pons that are c/f acute infarcts, most likely secondary to mass effect from hemorrhage. MRI brain 2/19 >> intraventricular clot has decreased in size but mid-brain parenchymal clot has not and there are more ischemic infarcts in the surrounding brain.  CT Head WO Contrast 09/07/20>> Expected evolution of intracranial hemorrhage since 08/30/2020 head CT. Multiple areas of low attenuation corresponding to some of the infarcts seen on prior MRI. Ventricle caliber similar to prior MRI   Micro Data:  2/9 COVID + Influenza negative 2/10 MRSA PCR negative 2/12 BCx 2 > no growth  2/19 BC 1/2 +Stap EPI, 1/2 no growth  10/07/2020 sputum positive for Serratia marcescens 4/8 UC >> enterobacter cloacae >> S-gent, imipenem. Otherwise resistant.   Antimicrobials:   Cefepime  2/12 > 2/15 Vanc 2/12 > 2/15 Ciprofloxacin 3/24 >> 3/30   Interim History / Subjective:  RN reports no acute changes  Pt on 5L / 28% ATC  Afebrile   Objective   Blood pressure 127/83, pulse 79, temperature 98.9 F (37.2 C), temperature source Axillary, resp. rate 16, height 5\' 5"  (1.651 m), weight 76 kg, last menstrual period 03/03/2014, SpO2 95 %, unknown if currently breastfeeding.    FiO2 (%):  [21 %] 21 %  No intake or output data in the 24 hours ending 11/02/20 0730 Filed Weights   10/27/20 0349 10/30/20 0500 10/31/20 0500  Weight: 67 kg 76 kg 76 kg   Exam:  General: chronically ill appearing adult female lying in bed in NAD   HEENT: MM pink/moist, #6 trach midline c/d/i Neuro: lying in bed eyes closed, no response to verbal stimuli CV: s1s2 RRR, no m/r/g PULM:  Non-labored at rest, lungs bilaterally diminished but clear  GI: soft, bsx4 active  Extremities: warm/dry, no edema  Skin: no rashes or lesions   Resolved Hospital Problem list   Hypernatremia  Obstructive hydrocephalus (resolved) Hypertensive emergency Acute hypoxemic respiratory failure secondary to CVA Serratia PNA  Bacterial Conjunctivitis   Assessment & Plan:   Tracheostomy Dependent s/p Multiple Acute Strokes c/b Intraparenchymal Hemorrhage with Right Thalamic Hemorrhage with extension into the third fourth and right lateral ventricle Torticollis/neck contracture  Dysphagia s/p PEG Anemia Sepsis 2/2 Enterobacter Urinary tract infection (new 4/8)     Tracheostomy Dependence post CVA  -trach stable, no changes  -continue trach care per protocol  -wean O2 for sats >90% -no decannulation due to mental status  -PCCM will continue follow Q week pending respiratory needs, please call sooner if new needs arise  -aspiration precautions      6/8, MSN, APRN, NP-C, AGACNP-BC [facility] 11/02/2020, 7:31 AM   Please see [email or web address] for pager details.    From 7A-7P if no response, please call [phone] After hours, please call ELink [phone]

## 2020-11-02 NOTE — Progress Notes (Signed)
Nutrition Follow-up  DOCUMENTATION CODES:  Not applicable  INTERVENTION:  Continue Tube feeding via PEG tube: Osmolite 1.2 at 55 ml/h (1320 ml per day) 45 ml ProSource TF daily  Provides 1624 kcal, 84 gm protein, 1070 ml free water daily  100 ml free water every 4 hours    NUTRITION DIAGNOSIS:  Inadequate oral intake related to inability to eat as evidenced by NPO status. -- ongoing  GOAL:  Patient will meet greater than or equal to 90% of their needs -- Met with TF  MONITOR:  TF tolerance,Vent status  REASON FOR ASSESSMENT:  Consult,Ventilator Enteral/tube feeding initiation and management  ASSESSMENT:  Pt with PMH of HTN with medication noncompliance admitted with hypertensive thalamic ICH.  2/10 s/p EVD placement and intubation for respiratory arrest 2/13 EVD removed 2/17 palliative care consult; family requesting tx to [facility]  2/24 s/p trach and PEG  3/1 tx to [facility] 3/11 trach changed to [place] #6 cuffless 4/08 pt w/ sepsis 2/2 UTI 4/18 28% ATC/5L  RN reports no acute changes with pt. Per CCM, no decannulation due to pt's mental status; plans to follow weekly to assess. Pt continues to receive TF via PEG. Current regimen: Osmolite 1.2 at 55 ml/h w/ 45 ml ProSource TF daily, 140m free water flushes Q4H. Tolerating well per RN.   No UOP documented x24 hours   Pt with generalized non-pitting edema and mild pitting edema to RUE per RN assessment  Admit wt: 61.2 kg Current wt: 76 kg (taken 4/16, ? Accuracy, nearly 10kg higher than 4 days prior). Will continue to monitor changes in wt/fluid status and adjust TF regimen as appropriate.   Medications: SSI Q4H, protonix, senokot Labs reviewed. CBGs 161-120-127  Diet Order:   Diet Order            Diet NPO time specified  Diet effective midnight                 EDUCATION NEEDS:   No education needs have been identified at this time  Skin:  Skin Assessment: Reviewed RN Assessment  Last BM:  4/17 type  7  Height:   Ht Readings from Last 1 Encounters:  10/18/20 '5\' 5"'  (1.651 m)    Weight:   Wt Readings from Last 1 Encounters:  10/31/20 76 kg    Ideal Body Weight:  56.8 kg  BMI:  Body mass index is 27.88 kg/m.  Estimated Nutritional Needs:   Kcal:  1600-1800  Protein:  80-90 grams  Fluid:  >1.6L/d    ALarkin Ina MS, RD, LDN RD pager number and weekend/on-call pager number located in [email or web address]

## 2020-11-03 LAB — GLUCOSE, CAPILLARY
Glucose-Capillary: 113 mg/dL — ABNORMAL HIGH (ref 70–99)
Glucose-Capillary: 133 mg/dL — ABNORMAL HIGH (ref 70–99)
Glucose-Capillary: 134 mg/dL — ABNORMAL HIGH (ref 70–99)
Glucose-Capillary: 135 mg/dL — ABNORMAL HIGH (ref 70–99)
Glucose-Capillary: 146 mg/dL — ABNORMAL HIGH (ref 70–99)
Glucose-Capillary: 92 mg/dL (ref 70–99)

## 2020-11-03 NOTE — Progress Notes (Signed)
Occupational Therapy Treatment Patient Details Name: Charlene [name] MRN: [id number] DOB: 1966/05/09 Today's Date: 11/03/2020    History of present illness Pt is 55 y/o female presents to [facility] on 2/9 with L sided weakness and headaches. PMH includes anxiety, HTN. 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 s/p Right frontal ventricular catheter placement on 2/10, stopped functioning on 2/11.  MRI 2/11 shows right greater than left mesial temporal lobes / parahippocampal cortex, bilateral basal ganglia, and possibly the upper pons that are concerning for acute infarcts, most likely secondary to mass effect from the hemorrhage. ETT 2/10-present.  Pt with trach and PEG on 09/10/20. Hospital course complicated by persistent fever, Serratia pneumonia and now pre-orbital cellulitis.   OT comments  Pt in recliner upon arrival with her mother present. Pt unable to scan with B eyes this session despite multimodal cues. Passive stretch to B UEs in multiple planes; L elbow and hand with increased tone but hand able to maintain digit extension. Educated pt's mother on gentle passive strecth to L elbow and how to look for any signs of pain during.Pt has L hand splint and pt's mother able to verbalize and demo how to correctly don and doff. Educated pt's mother to chek skin in elbo crease for signs of any moisture or redness that could lead to skin breakdown. OT will continue to follow acutely  Follow Up Recommendations  SNF;Supervision/Assistance - 24 hour    Equipment Recommendations  Hospital bed;Wheelchair cushion (measurements OT);Wheelchair (measurements OT)    Recommendations for Other Services      Precautions / Restrictions Precautions Precautions: Fall Precaution Comments: peg and trach Restrictions Weight Bearing Restrictions: No       Mobility Bed  Mobility Overal bed mobility: Needs Assistance Bed Mobility: Rolling Rolling: Total assist   Supine to sit: Total assist;+2 for physical assistance     General bed mobility comments: rolled L and R for pericare before sitting up, tot A +2 though pt had her R knee in bridge position with R hand on knee to begin which made R roll easier. RUE extended and adducted between legs during roll. Supine to sit with tot A +2    Transfers Overall transfer level: Needs assistance               General transfer comment: total assist +2 safety using maximove to recliner from seated position EOB    Balance Overall balance assessment: Needs assistance Sitting-balance support: No upper extremity supported;Feet supported Sitting balance-Leahy Scale: Poor Sitting balance - Comments: sat EOB x10 mins with min A,  cervical stretching performed as well as knee ext/ flex. Postural control: Posterior lean     Standing balance comment: unable                           ADL either performed or assessed with clinical judgement   ADL                                         General ADL Comments: total A     Vision   Additional Comments: Pt with B eyes opne this session and sitting up in recliner with her mother present. Unable to get pt to scan eyes, pt with  forward gaze depsite multimodal cues to get her to scan   Perception     Praxis      Cognition Arousal/Alertness: Awake/alert Behavior During Therapy: Flat affect Overall Cognitive Status: Difficult to assess                                 General Comments: when pt was rolled to R today she made a face that I have not seen before this date. It looked less like pain and more like a startle followed by dislike. Could not elicit it again with other mvmts        Exercises General Exercises - Lower Extremity Hip ABduction/ADduction: PROM;Supine (prolonged stretch to adductors) Other  Exercises Other Exercises: Passive stretch to B UEs in multiple planes; L elbow and hand with increased tone but hand able to maintain digit extension.Educated pt's mother on gentle passive strecth to L elbow and how to look for any signs of pain during.Pt has L hand splint and pt's mother able to verbalize and demo how to correctly don and doff. Edcuate dpt's mother to chek skin in elbo crease for signs of any moisture or redness that could lead to skin breakdown Other Exercises: mom positioned on L side of pt after session Other Exercises: pt with L lean in sitting, pillow placed behind R shoulder for midline sitting   Shoulder Instructions       General Comments mother present for session and very agreeable to education. Doing UE ROM upon PT arrival and education for LE ROM to be done in chair afterwards.    Pertinent Vitals/ Pain       Pain Assessment: Faces Faces Pain Scale: Hurts a little bit Pain Location: grimace with B hip adduction and R elbow flexion Pain Descriptors / Indicators: Discomfort Pain Intervention(s): Monitored during session  Home Living                                          Prior Functioning/Environment              Frequency  Min 2X/week        Progress Toward Goals  OT Goals(current goals can now be found in the care plan section)  Progress towards OT goals: OT to reassess next treatment  Acute Rehab OT Goals Patient Stated Goal: pt's family wants her to go to intense rehab  Plan Discharge plan remains appropriate;Frequency remains appropriate    Co-evaluation                 AM-PAC OT "6 Clicks" Daily Activity     Outcome Measure   Help from another person eating meals?: Total Help from another person taking care of personal grooming?: Total Help from another person toileting, which includes using toliet, bedpan, or urinal?: Total Help from another person bathing (including washing, rinsing, drying)?:  Total Help from another person to put on and taking off regular upper body clothing?: Total Help from another person to put on and taking off regular lower body clothing?: Total 6 Click Score: 6    End of Session    OT Visit Diagnosis: Other symptoms and signs involving the nervous system (R29.898);Other symptoms and signs involving cognitive function;Other (comment) Symptoms and signs involving cognitive functions: Other Nontraumatic ICH Hemiplegia - Right/Left:  (B) Hemiplegia - caused  by: Other Nontraumatic intracranial hemorrhage   Activity Tolerance Patient tolerated treatment well   Patient Left with bed alarm set;with family/visitor present;in chair;with chair alarm set   Nurse Communication          Time: 0626-9485 OT Time Calculation (min): 20 min  Charges: OT General Charges $OT Visit: 1 Visit OT Treatments $Therapeutic Activity: 8-22 mins    Galen Manila 11/03/2020, 3:18 PM

## 2020-11-03 NOTE — Progress Notes (Addendum)
[facility] PROGRESS NOTE  Charlene [name] YBO:[id number] DOB: 14-Jul-1966 DOA: 08/26/2020 PCP: Patient, [name] (Inactive)    October 08, 2020     10/16/2020                        10/16/2020     10/18/2020                        10/20/2020    Status: Remains inpatient appropriate because:Altered mental status, Unsafe d/c plan, IV treatments appropriate due to intensity of illness or inability to take PO and Inpatient level of care appropriate due to severity of illness   Dispo: The patient is from: Home              Anticipated d/c is to: SNF vs brain injury-family does not wish to place patient in SNF.  If this is the only option they would like to consider taking the patient home although this may be a difficult undertaking given current requirement for trach and understandable fears about managing such complex patient in the home environment.  They have been updated as to why this patient currently is not a candidate for aggressive rehabilitative therapies like would be provided at a brain injury recovery rehab.               Patient currently is medically stable to d/c.   Difficult to place patient Yes   Level of care: Progressive  Code Status: Full Family Communication:  extensive conversation via telephone with [name] [phone]) at bedside on 4/1.; [name] [phone]) is her POA and legal contact.  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 Subsequently on 2/10, she developed obtundation and respiratory arrest leading to intubation. She has remained persistently obtunded, now has a trach and a PEG. Hospital course complicated by persistent fever,Serratia pneumoniaand now pre-orbital cellulitis  As of 3/25 with adjustments and hypertonicity medications patient seems to be improving.  She is also waking up more although limited interaction but both eyes are now open and she tracks with the left eye.  Multiple discussions with husband who has confirmed he has not delegated other family members to be POA and he is the primary legal contact for patient.  He has agreed to let patient's daughter take the lead on completing and following up on Medicaid and disability applications including having these applications sent to her address.  2/09 Admit for [facility] 2/10 EVD placed, later intubated due to obtundation and respiratory arrest 2/11 MRI Brain, hypertonic saline d/c'd, EVD not working. Husband did not consent to PICC  2/13 EVD removed 2/17 [place] discussion with family, PMT 2/24 Bedside trach and PEG 3/1 Transferred to [facility]  Subjective: Awake.  Eyes open.  Mom at bedside.  Patient tracking persons in room with eyes.  Did not follow  simple commands for me.  Did not have reflexive grip when I placed my hand in her right hand but later did grasp her mother's hand.  Objective: Vitals:   11/03/20 0328 11/03/20 0700  BP: (!) 151/91 (!) 162/103  Pulse: 98 97  Resp: 20 20  Temp: 99.2 F (37.3 C) 98.9 F (37.2 C)  SpO2: 99% 100%   No intake or output data in the 24 hours ending 11/03/20 0806 Filed Weights   10/27/20 0349 10/30/20 0500 10/31/20 0500  Weight: 67 kg 76 kg 76 kg    Exam:  Constitutional: Awake, calm, no acute distress.  Appears to be comfortable. Respiratory: #6.0 cuffless trach, trach  collar with FiO2 21% at 5L,O2 sats 97 to 99%.  Anterior lung sounds clear to auscultation.  Stable without any increased work of breathing Cardiovascular: Normotensive, no tachycardia.  Regular pulse.  Normal heart sounds Abdomen:  PEG tube-LBM 4/18, nontender.  Normoactive bowel sounds.  Tube feeding infusing. Neurologic: CN 2-12 intact except notable with left ocular disconjugate gaze with medial right downward gaze. Lower extremity foot splints in place.  Able to track with right eye. Patient has spontaneous purposeful movement that is gross motor in nature of right upper extremity. Left preferance torticollis and gen hypertonicity controlled. Psych: Able to accurately assess due to patient's nonverbal state.  Does not appear to be following simple commands although appears to recognize voices of personnel and family in room.    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 -Continue baclofen 5 mg at 8 AM and 4 PM and 7.5 mg at bedtime.  LFTs remain stable with baseline subtle elevation without change since initiation of baclofen. -Continue Provigil 200 mg daily as recommended by PMR team -Add scheduled Senokot at HS given requirement for SMRs -OOB to chair daily and PROM every 4 hours per nursing staff -Per OT recommendations have made staff aware that patient needs to move to odd numbered side of hall to improve visual-spatial interaction and acuity.  ESBL positive Enterobacter UTI -> 100,000 colonies-sensitive to gentamicin imipenem, resistant to cefazolin, cefepime Septra and intermediate to Cipro, nitrofurantoin energetically. -Has completed 5 days of meropenem -DC Purewick during acute  UTI -Contact isolation -Continue Myrbetriq for suspected bladder spasm  Neck Contracture/torticollis -Controlled -Continue baclofen as above -- continue PT/OT weekly per their recommendations given patient's inability to effectively participate at this juncture.  Nursing staff performing PROM activities.  Family also instructed and has been performing intermittently. PT eval 4/12      Pt with no functional movement noted this session. Pt not following any commands or tracking therapist around room. Continue to feel that it would be beneficial for the pt to be positioned in a room where the hallway is on her L side, to encourage L attention. Pt could also benefit from an air mattress prophylactically to decrease risk for pressure injuries in conjunction with her turn schedule. Discussed with RN who agreed and nurse secretary to order. Acute PT continues to recommend SNF at d/c as she is currently not at a level to tolerate the increased intensity of CIR level therapies, although this appears to be family's first choice. Will continue to follow and progress as able per POC.       OT eval 4/14:  Patient supine in bed with spouse present. Noted both eyes open today, both eyes scanning from L to R and appear to be moving together at times.  L eye remains with downward gaze.  Patient able to focus on therapist when therapist voicing "Vivan Look at me" given increased time and multimodal cueing, appears to focus on picture/scan up to 10-15 degrees following picture but inconsistent as looses attention to picture easily. With thumb movement (UE positioned in optimal position) given repeated cues, multimodal cueing moving thumb on 3/4 trials (with rest breaks in between) and responding best to "Shiara move your thumb" (instead of thumbs up). Educated spouse on role of OT and goal OT is working towards currently, spouse voiced understanding and eager to bring in familiar items for OT to utilize during  sessions.  Spoke to RN about moving rooms to other side of hallway, as this has not happened yet. Plan for next session to occlude L eye, bring soft pillow splint for L elbow. Will follow acutely.    -OT would also like to have patient placed in a different room across the hallway which would allow for optimal visualization using right eye based on positioning in bed and said room.       Hypertensive emergency/Malignant HTN -Home medications include Coreg and HCTZ which have not been reordered -Continue low-dose Norvasc as well as metoprolol for tachycardia  Acute hypoxemic respiratory failure secondary to CVA requiring mechanical ventilation and tracheostomy tube -Stable on trach collar and has been transitioned to a #6 cuffless trach -Has significant altered mentation and inability to manage oral secretions at present time is not a safe candidate for decannulation.  Discussed this with family as major obstacle in discharging to home.  This will also decrease options for SNF placement. -Appreciate trach team assistance in management  Pain likely related to underlying hypertonicity -Significantly improved -Morphine IV, oxycodone and Tylenol prn (watch elev LFTs) -Continue baclofen as above  Dysphagia/Nutrition Status: Nutrition Problem: Inadequate oral intake Etiology: inability to eat Signs/Symptoms: NPO status Interventions: Tube feeding consisting of Osmolite 1.2 Cal at 55 cc/h; continue Prosource liquid 45 cc daily, free water 100 cc every 4 hours per tube; will also continue to follow CBGs regularly while on tube feedings and provide SSI Estimated body mass index is 27.88 kg/m as calculated from the following:   Height as of this encounter: _0  (1.651 m).   Weight as of this encounter: 76 kg. If patient eventually discharged to home to teach family how to bolus feed as opposed to utilizing continuous tube feeding infusions.   Other problems: Anasarca -bilateral upper  extremity edema, minimal LE edema - suspect related to prolonged hospitalization, hypoalbuminemia -upper extremity US-> age indeterminate SVT of right cephalic vein  Elevated LFT's - Remain elevated but stable without any obstructive pattern -Continue baclofen and statin  Tachycardia/suboptimal BP -Resolved -Was given 1L of NS and vfree water increased fom 100 c q 4 hrs to 200 cc  -HR and BP improved -Norvasc was decreased to 2.5 mg 4/8  Bacterial Conjunctivitis/periorbital cellulitis - MRI 3/7 orbits with L periorbital cellulitis  -- CT head 3/9 with persistent subtotal opacification of paranasal sinuses and mastoid air cells, bubbly lucency at base of R maxillary sinus (likely odontogenic in origin) - CT maxillofacial 3/12 - improving cellulitis  - s/p erythromycin ointment - completed course of Ancef   Chronic pansinusitis/recurrent fevers 2/2 aspiration PNA;History of Serratia Pneumonia  Hospital Acquired Pneumonia:  -Tracheal aspirate from 2/25 and  3/1 with serratia - cefepime 2/27-3/5; repeat sputum cx 3/21 + serratia (likely colonization but w/ recurrent fever opted to treat) -Has completed antibiotics recently for bilateral orbital cellulitis -started on Cipro for tracheal aspirate positive for Serratia marcescens   -Repeat sinus CT demonstrates progressive complete bilateral maxillary sinus opacification with unchanged complete left sphenoid sinus opacification and right sphenoid sinus fluid c/w chronic state  -CT Chest 3/30 with aspiration PNA-dtr updated at bedside by attending MD- Cipro changed to Unasyn IV-Unasyn now completed -no increased O2 needs  Anemia -Stable, follow     Data Reviewed: Basic Metabolic Panel: Recent Labs  Lab 11/02/20 0433  NA 138  K 4.0  CL 101  CO2 29  GLUCOSE 113*  BUN 16  CREATININE 0.50  CALCIUM 9.5   Liver Function Tests: No results for input(s): AST, ALT, ALKPHOS, BILITOT, PROT, ALBUMIN in the last 168 hours. No results  for input(s): LIPASE, AMYLASE in the last 168 hours. No results for input(s): AMMONIA in the last 168 hours. CBC: Recent Labs  Lab 11/02/20 0433  WBC 6.1  HGB 10.8*  HCT 34.8*  MCV 81.5  PLT 453*   Cardiac Enzymes: No results for input(s): CKTOTAL, CKMB, CKMBINDEX, TROPONINI in the last 168 hours. BNP (last 3 results) No results for input(s): BNP in the last 8760 hours.  ProBNP (last 3 results) No results for input(s): PROBNP in the last 8760 hours.  CBG: Recent Labs  Lab 11/02/20 1126 11/02/20 1601 11/02/20 2026 11/02/20 2358 11/03/20 0334  GLUCAP 129* 134* 131* 168* 135*    No results found for this or any previous visit (from the past 240 hour(s)).   Studies: No results found.  Scheduled Meds: . amLODipine  10 mg Per Tube Daily  . atorvastatin  40 mg Per Tube Daily  . baclofen  5 mg Per Tube 2 times per day  . baclofen  7.5 mg Per Tube Daily  . chlorhexidine gluconate (MEDLINE KIT)  15 mL Mouth Rinse BID  . Chlorhexidine Gluconate Cloth  6 each Topical Daily  . enoxaparin (LOVENOX) injection  40 mg Subcutaneous Q24H  . feeding supplement (PROSource TF)  45 mL Per Tube Daily  . free water  100 mL Per Tube Q4H  . hydrALAZINE  50 mg Per Tube Q8H  . insulin aspart  0-9 Units Subcutaneous Q4H  . mouth rinse  15 mL Mouth Rinse 5 X Daily  . metoprolol tartrate  100 mg Per Tube BID  . mirabegron ER  25 mg Oral Daily  . modafinil  200 mg Per Tube Daily  . pantoprazole sodium  40 mg Per Tube BID  . scopolamine  1 patch Transdermal Q72H  . sennosides  5 mL Per Tube QHS   Continuous Infusions: . sodium chloride Stopped (10/26/20 1445)  . feeding supplement (OSMOLITE 1.2 CAL) 1,000 mL (11/03/20 0747)    Principal Problem:   ICH (intracerebral hemorrhage) ([place]) Active Problems:   Intraparenchymal hemorrhage of brain (HCC)   Acute hypoxemic respiratory failure (HCC)   Fever   Endotracheal tube present   Status post tracheostomy ([place])   Palliative care by  specialist   Muscle hypertonicity   Oropharyngeal dysphagia   Brain injury with loss of consciousness ([place])   Poor prognosis   Aspiration pneumonia of right lower lobe (HCC)   Gram-negative infection   Abnormal urinalysis   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   Acute cystitis without hematuria   Consultants:  Palliative  care  Pulm  Neurology   Procedures:  Tracheostomy  PEG tube  EEG  Echocardiogram  Antibiotics: Anti-infectives (From admission, onward)   Start     Dose/Rate Route Frequency Ordered Stop   10/27/20 0845  meropenem (MERREM) 1 g in sodium chloride 0.9 % 100 mL IVPB  Status:  Discontinued        1 g 200 mL/hr over 30 Minutes Intravenous Every 8 hours 10/27/20 0752 11/01/20 0720   10/26/20 1145  cefTRIAXone (ROCEPHIN) 1 g in sodium chloride 0.9 % 100 mL IVPB  Status:  Discontinued        1 g 200 mL/hr over 30 Minutes Intravenous Every 24 hours 10/26/20 1056 10/27/20 0752   10/14/20 1815  Ampicillin-Sulbactam (UNASYN) 3 g in sodium chloride 0.9 % 100 mL IVPB        3 g 200 mL/hr over 30 Minutes Intravenous Every 6 hours 10/14/20 1754 10/19/20 1340   10/08/20 0845  ciprofloxacin (CIPRO) tablet 500 mg  Status:  Discontinued        500 mg Per Tube 2 times daily 10/08/20 0754 10/14/20 1731   09/21/20 1545  ceFAZolin (ANCEF) IVPB 1 g/50 mL premix        1 g 100 mL/hr over 30 Minutes Intravenous Every 8 hours 09/21/20 1457 09/28/20 0647   09/20/20 0600  ceFAZolin (ANCEF) IVPB 1 g/50 mL premix  Status:  Discontinued        1 g 100 mL/hr over 30 Minutes Intravenous Every 8 hours 09/14/20 0903 09/14/20 0904   09/16/20 1300  vancomycin (VANCOREADY) IVPB 1250 mg/250 mL  Status:  Discontinued        1,250 mg 166.7 mL/hr over 90 Minutes Intravenous Every 24 hours 09/15/20 1232 09/17/20 0956   09/15/20 1230  vancomycin (VANCOREADY) IVPB 1250 mg/250 mL        1,250 mg 166.7 mL/hr over 90 Minutes  Intravenous  Once 09/15/20 1131 09/15/20 1402   09/13/20 1400  ceFEPIme (MAXIPIME) 2 g in sodium chloride 0.9 % 100 mL IVPB        2 g 200 mL/hr over 30 Minutes Intravenous Every 8 hours 09/13/20 1341 09/19/20 2208   09/06/20 0600  vancomycin (VANCOREADY) IVPB 1250 mg/250 mL        1,250 mg 166.7 mL/hr over 90 Minutes Intravenous  Once 09/06/20 0541 09/06/20 0739   09/05/20 1400  ceFAZolin (ANCEF) IVPB 1 g/50 mL premix  Status:  Discontinued        1 g 100 mL/hr over 30 Minutes Intravenous Every 8 hours 09/05/20 1120 09/13/20 1341   08/30/20 0600  vancomycin (VANCOREADY) IVPB 500 mg/100 mL  Status:  Discontinued        500 mg 100 mL/hr over 60 Minutes Intravenous Every 12 hours 08/29/20 1554 09/01/20 1115   08/29/20 1700  ceFEPIme (MAXIPIME) 2 g in sodium chloride 0.9 % 100 mL IVPB  Status:  Discontinued        2 g 200 mL/hr over 30 Minutes Intravenous Every 12 hours 08/29/20 1554 09/01/20 1115   08/29/20 1645  vancomycin (VANCOREADY) IVPB 1250 mg/250 mL        1,250 mg 166.7 mL/hr over 90 Minutes Intravenous  Once 08/29/20 1554 08/29/20 1820       Time spent: 25 minutes    Erin Hearing ANP  [facility] 7 am - 330 pm/M-F for direct patient care and secure chat Please refer to Amion for contact info 68  days

## 2020-11-03 NOTE — Progress Notes (Signed)
Physical Therapy Treatment Patient Details Name: Charlene [name] MRN: [id number] DOB: May 30, 1966 Today's Date: 11/03/2020    History of Present Illness Pt is 55 y/o female presents to [facility] on 2/9 with L sided weakness and headaches. PMH includes anxiety, HTN. 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 s/p Right frontal ventricular catheter placement on 2/10, stopped functioning on 2/11.  MRI 2/11 shows right greater than left mesial temporal lobes / parahippocampal cortex, bilateral basal ganglia, and possibly the upper pons that are concerning for acute infarcts, most likely secondary to mass effect from the hemorrhage. ETT 2/10-present.  Pt with trach and PEG on 09/10/20. Hospital course complicated by persistent fever, Serratia pneumonia and now pre-orbital cellulitis.    PT Comments    Pt made face during session today previously not seen by this therapist. With R rolling she made a startle/ face of dislike. Could not recreate with other mvmts. Pt rolled B in bed for pericare with max A. Pt able to maintain R knee bridged with R hand on knee in supine for 3 minutes which assisted with R rolling. Tot A +2 for SL to sit. Pt maintained sitting 10 min EOB with cervical stretching and working on midline sitting. Maximove pad placed under pt in sitting and pt transferred sitting EOB to sitting in recliner. Mother positioned on L side of pt after session and educated on PROM to BLE. PT will continue to follow.    Follow Up Recommendations  SNF;Supervision/Assistance - 24 hour     Equipment Recommendations  Hospital bed    Recommendations for Other Services       Precautions / Restrictions Precautions Precautions: Fall Precaution Comments: peg and trach Restrictions Weight Bearing Restrictions: No    Mobility  Bed Mobility Overal bed mobility: Needs  Assistance Bed Mobility: Rolling Rolling: Total assist   Supine to sit: Total assist;+2 for physical assistance     General bed mobility comments: rolled L and R for pericare before sitting up, tot A +2 though pt had her R knee in bridge position with R hand on knee to begin which made R roll easier. RUE extended and adducted between legs during roll. Supine to sit with tot A +2    Transfers Overall transfer level: Needs assistance               General transfer comment: total assist +2 safety using maximove to recliner from seated position EOB  Ambulation/Gait             General Gait Details: unable   Stairs             Wheelchair Mobility    Modified Rankin (Stroke Patients Only) Modified Rankin (Stroke Patients Only) Pre-Morbid Rankin Score: No symptoms Modified Rankin: Severe disability     Balance Overall balance assessment: Needs assistance Sitting-balance support: No upper extremity supported;Feet supported Sitting balance-Leahy Scale: Poor Sitting balance - Comments: sat EOB x10 mins with min A,  cervical stretching performed as well as knee ext/ flex. Postural control: Posterior lean     Standing balance comment: unable                            Cognition Arousal/Alertness: Awake/alert Behavior During Therapy: Flat affect Overall Cognitive Status: Difficult to assess  General Comments: when pt was rolled to R today she made a face that I have not seen before this date. It looked less like pain and more like a startle followed by dislike. Could not elicit it again with other mvmts      Exercises General Exercises - Lower Extremity Hip ABduction/ADduction: PROM;Supine (prolonged stretch to adductors) Other Exercises Other Exercises: passive stretch to ankles with pt in sitting with feet on floor, B ankles at 90 deg df Other Exercises: mom positioned on L side of pt after  session Other Exercises: pt with L lean in sitting, pillow placed behind R shoulder for midline sitting    General Comments General comments (skin integrity, edema, etc.): mother present for session and very agreeable to education. Doing UE ROM upon PT arrival and education for LE ROM to be done in chair afterwards.      Pertinent Vitals/Pain Pain Assessment: Faces Faces Pain Scale: Hurts a little bit Pain Location: grimace with B hip adduction and R elbow flexion Pain Descriptors / Indicators: Discomfort Pain Intervention(s): Monitored during session    Home Living                      Prior Function            PT Goals (current goals can now be found in the care plan section) Acute Rehab PT Goals Patient Stated Goal: pt's family wants her to go to intense rehab PT Goal Formulation: Patient unable to participate in goal setting Time For Goal Achievement: 11/17/20 Potential to Achieve Goals: Poor Additional Goals Additional Goal #1: provide positioning, mobility, and transfer education to family and have family participate in these activities during PT    Frequency    Min 1X/week      PT Plan Current plan remains appropriate    [name]-evaluation              AM-PAC PT "6 Clicks" Mobility   Outcome Measure  Help needed turning from your back to your side while in a flat bed without using bedrails?: Total Help needed moving from lying on your back to sitting on the side of a flat bed without using bedrails?: Total Help needed moving to and from a bed to a chair (including a wheelchair)?: Total Help needed standing up from a chair using your arms (e.g., wheelchair or bedside chair)?: Total Help needed to walk in hospital room?: Total Help needed climbing 3-5 steps with a railing? : Total 6 Click Score: 6    End of Session Equipment Utilized During Treatment: Oxygen Activity Tolerance: Patient tolerated treatment well Patient left: with call bell/phone  within reach;in chair;with chair alarm set (posey alarm belt) Nurse Communication: Mobility status;Need for lift equipment PT Visit Diagnosis: Hemiplegia and hemiparesis;Other symptoms and signs involving the nervous system (R29.898);Other abnormalities of gait and mobility (R26.89) Hemiplegia - Right/Left: Right Hemiplegia - dominant/non-dominant: Dominant Hemiplegia - caused by: Cerebral infarction;Nontraumatic intracerebral hemorrhage;Other Nontraumatic intracranial hemorrhage     Time: 9509-3267 PT Time Calculation (min) (ACUTE ONLY): 35 min  Charges:  $Therapeutic Exercise: 8-22 mins $Therapeutic Activity: 8-22 mins                     Charlene [name], PT  Acute Rehab Services  Pager [phone] Office [phone]    Charlene [name] Charlene [name] 11/03/2020, 1:30 PM

## 2020-11-04 LAB — GLUCOSE, CAPILLARY
Glucose-Capillary: 152 mg/dL — ABNORMAL HIGH (ref 70–99)
Glucose-Capillary: 122 mg/dL — ABNORMAL HIGH (ref 70–99)
Glucose-Capillary: 99 mg/dL (ref 70–99)
Glucose-Capillary: 107 mg/dL — ABNORMAL HIGH (ref 70–99)
Glucose-Capillary: 112 mg/dL — ABNORMAL HIGH (ref 70–99)
Glucose-Capillary: 120 mg/dL — ABNORMAL HIGH (ref 70–99)

## 2020-11-04 NOTE — Progress Notes (Addendum)
1:20pm: CSW spoke with patient's husband [name] to update him on placement efforts. [name] states that himself nor any of the patient's family are able to provide 24/7 care for the patient at home. [name] is agreeable for CSW to continue searching for a facility that can accommodate the trach and peg, in addition to accepting an LOG. CSW encouraged [name] to reach out for assistance if needs or questions arise, he is agreeable.  12pm: CSW received notification from [place] with [facility] stating they will not be able to offer a bed to this patient.  CSW attempted to reach Millie [phone]) in admissions at [facility] without success, a voicemail was left requesting a return call.  Edwin Dada, MSW, LCSW Transitions of Care  Clinical Social Worker II [phone]  [phone]

## 2020-11-04 NOTE — Progress Notes (Signed)
[facility] PROGRESS NOTE  [name] PANDEY ZRA:[id number] DOB: 16-Nov-1965 DOA: 08/26/2020 PCP: Patient, [name] (Inactive)    October 08, 2020     10/16/2020                        10/16/2020     10/18/2020                        10/20/2020    Status: Remains inpatient appropriate because:Altered mental status, Unsafe d/c plan, IV treatments appropriate due to intensity of illness or inability to take PO and Inpatient level of care appropriate due to severity of illness   Dispo: The patient is from: Home              Anticipated d/c is to: SNF vs brain injury-family does not wish to place patient in SNF.  If this is the only option they would like to consider taking the patient home although this may be a difficult undertaking given current requirement for trach and understandable fears about managing such complex patient in the home environment.  They have been updated as to why this patient currently is not a candidate for aggressive rehabilitative therapies like would be provided at a brain injury recovery rehab.               Patient currently is medically stable to d/c.   Difficult to place patient Yes   Level of care: Progressive  Code Status: Full Family Communication:  extensive conversation via telephone with [name] [phone]) at bedside on 4/1.; [name] [phone]) is her POA and legal contact.  4/13 mother at bedside DVT prophylaxis: SCDs Vaccination status: Unknown  Micro Data:  2/9 COVID + Influenza negative 2/10 MRSA PCR negative 2/12 BCx 2 > no growth  2/19 BC 1/2 +Stap EPI, 1/2 no growth  10/07/2020 sputum positive for Serratia marcescens 4/8 Enterobacter cloaca and urine culture MDR sensitive only to imipenem and gentamicin  Antimicrobials:  Cefepime 2/12 > 2/15 Vanc 2/12 > 2/15 10/08/2020 ciprofloxacin>>3/30 Unasyn 3/30 >> 4/02 Rocephin 4/11 > 4/12 Meropenem 4/12>4/17   Foley catheter:  No-Purewick discontinued on 4/13  HPI: 55  yo F with 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 Subsequently on 2/10, she developed obtundation and respiratory arrest leading to intubation. She has remained persistently obtunded, now has a trach and a PEG. Hospital course complicated by persistent fever,Serratia pneumoniaand now pre-orbital cellulitis  As of 3/25 with adjustments in hypertonicity medications patient seems to be improving.  She is also waking up more although limited interaction but both eyes are now open and she tracks with the left eye.  Multiple discussions with husband who has confirmed he has not delegated other family members to be POA and he is the primary legal contact for patient.  He has agreed to let patient's daughter take the lead on completing and following up on Medicaid and disability applications including having these applications sent to her address.  2/09 Admit for [facility] 2/10 EVD placed, later intubated due to obtundation and respiratory arrest 2/11 MRI Brain, hypertonic saline d/c'd, EVD not working. Husband did not consent to PICC  2/13 EVD removed 2/17 [place] discussion with family, PMT 2/24 Bedside trach and PEG 3/1 Transferred to [facility]  Subjective: Sleeping soundly.  Did not awaken to voice or tactile stimulation.  [name] is at the bedside having just  arrived.  Objective: Vitals:   11/04/20 0308 11/04/20 0700  BP: 118/77 126/77  Pulse: 93 93  Resp: 18 16  Temp: 97.7 F (36.5 C) 98.1 F (36.7 C)  SpO2:  99%    Intake/Output Summary (Last 24 hours) at 11/04/2020 0747 Last data filed at 11/04/2020 0410 Gross per 24 hour  Intake --  Output 2 ml  Net -2 ml   Filed Weights   10/27/20 0349 10/30/20 0500 10/31/20 0500  Weight: 67 kg 76 kg 76 kg    Exam:  Constitutional: Sleeping soundly.  Appears to be comfortable Respiratory: #6.0 cuffless trach, trach collar with FiO2 21% at 5L,O2 sats 97 to  99%.  Anterior lung sounds are clear, stable without any increased work of breathing Cardiovascular: Normal heart sounds, no peripheral edema, hemodynamically stable without tachycardia. Abdomen:  PEG tube-LBM 4/19 abdomen is soft with normoactive bowel sounds.  Tube feeding infusing without difficulty. Neurologic: CN 2-12 intact except notable with left ocular disconjugate gaze with medial right downward gaze. Lower extremity foot splints in place.  Able to track with right eye. Patient has spontaneous purposeful movement that is gross motor in nature of right upper extremity. Left preferance torticollis and gen hypertonicity controlled. Psych: Sleeping soundly.    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 -Continue baclofen 5 mg at 8 AM and 4 PM and 7.5 mg at bedtime.  LFTs remain stable with baseline subtle elevation without change since initiation of baclofen. -Continue Provigil 200 mg daily as recommended by PMR team -Add scheduled Senokot at HS given requirement for SMRs -OOB to chair daily and PROM every 4 hours per nursing staff -Per OT recommendations have made staff aware that patient needs to move to odd numbered side of hall to improve visual-spatial interaction and acuity.  Neck Contracture/torticollis -Controlled -Continue baclofen as above -- continue PT/OT weekly per their recommendations given patient's inability to effectively participate at this juncture.  Nursing staff performing PROM activities.  Family also instructed and has been performing intermittently. PT eval 4/19      Pt made face during session today previously not seen by this therapist. With R rolling  she made a startle/ face of dislike. Could not recreate with other mvmts. Pt rolled B in bed for pericare with max A. Pt able to maintain R knee bridged with R hand on knee in supine for 3 minutes which assisted with R rolling. Tot A +2 for SL to sit. Pt maintained sitting 10 min EOB with cervical stretching and working on midline sitting. Maximove pad placed under pt in sitting and pt transferred sitting EOB to sitting in recliner. Mother positioned on L side of pt after session and educated on PROM to BLE. PT will continue to follow.      OT eval 4/19:          Pt in recliner upon arrival with her mother present. Pt unable to scan with B eyes this session despite multimodal cues. Passive stretch to B UEs in multiple planes; L elbow and hand with increased tone but hand able to maintain digit extension. Educated pt's mother on gentle passive strecth to L elbow and how to look for any signs of pain during.Pt has L hand splint and pt's mother able to verbalize and  demo how to correctly don and doff. Educated pt's mother to chek skin in elbo crease for signs of any moisture or redness that could lead to skin breakdown. OT will continue to follow acutely            Hypertensive emergency/Malignant HTN -Home medications include Coreg and HCTZ which have not been reordered -Continue low-dose Norvasc as well as metoprolol for tachycardia  Acute hypoxemic respiratory failure secondary to CVA requiring mechanical ventilation and tracheostomy tube -Stable on trach collar and has been transitioned to a #6 cuffless trach -Has significant altered mentation and inability to manage oral secretions at present time is not a safe candidate for decannulation.  Discussed this with family as major obstacle in discharging to home.  This will also decrease options for SNF placement. -Appreciate trach team assistance in management  Pain likely related to underlying hypertonicity -Significantly  improved -Morphine IV, oxycodone and Tylenol prn (watch elev LFTs) -Continue baclofen as above  Dysphagia/Nutrition Status: Nutrition Problem: Inadequate oral intake Etiology: inability to eat Signs/Symptoms: NPO status Interventions: Tube feeding consisting of Osmolite 1.2 Cal at 55 cc/h; continue Prosource liquid 45 cc daily, free water 100 cc every 4 hours per tube; will also continue to follow CBGs regularly while on tube feedings and provide SSI Estimated body mass index is 27.88 kg/m as calculated from the following:   Height as of this encounter: 5' 5" (1.651 m).   Weight as of this encounter: 76 kg. If patient eventually discharged to home to teach family how to bolus feed as opposed to utilizing continuous tube feeding infusions.     Other problems: Anasarca -bilateral upper extremity edema, minimal LE edema - suspect related to prolonged hospitalization, hypoalbuminemia -upper extremity US-> age indeterminate SVT of right cephalic vein  ESBL positive Enterobacter UTI -> 100,000 colonies-sensitive to gentamicin imipenem, resistant to cefazolin, cefepime Septra and intermediate to Cipro, nitrofurantoin energetically. -Has completed 5 days of meropenem -DC Purewick during acute UTI -Contact isolation -Continue Myrbetriq for suspected bladder spasm  Elevated LFT's - Remain elevated but stable without any obstructive pattern -Continue baclofen and statin  Tachycardia/suboptimal BP -Resolved -Was given 1L of NS and vfree water increased fom 100 c q 4 hrs to 200 cc  -HR and BP improved -Norvasc was decreased to 2.5 mg 4/8  Bacterial Conjunctivitis/periorbital cellulitis - MRI 3/7 orbits with L periorbital cellulitis  -- CT head 3/9 with persistent subtotal opacification of paranasal sinuses and mastoid air cells, bubbly lucency at base of R maxillary sinus (likely odontogenic in origin) - CT maxillofacial 3/12 - improving cellulitis  - s/p erythromycin ointment -  completed course of Ancef   Chronic pansinusitis/recurrent fevers 2/2 aspiration PNA;History of Serratia Pneumonia  Hospital Acquired Pneumonia:  -Tracheal aspirate from 2/25 and 3/1 with serratia - cefepime 2/27-3/5; repeat sputum cx 3/21 + serratia (likely colonization but w/ recurrent fever opted to treat) -Has completed antibiotics recently for bilateral orbital cellulitis -started on Cipro for tracheal aspirate positive for Serratia marcescens   -Repeat sinus CT demonstrates progressive complete bilateral maxillary sinus opacification with unchanged complete left sphenoid sinus opacification and right sphenoid sinus fluid c/w chronic state  -CT Chest 3/30 with aspiration PNA-dtr updated at bedside by attending MD- Cipro changed to Unasyn IV-Unasyn now completed -no increased O2 needs  Anemia -Stable, follow     Data Reviewed: Basic Metabolic Panel: Recent Labs  Lab 11/02/20 0433  NA 138  K 4.0  CL 101  CO2 29  GLUCOSE 113*  BUN 16  CREATININE 0.50  CALCIUM 9.5   Liver Function Tests: No results for input(s): AST, ALT, ALKPHOS, BILITOT, PROT, ALBUMIN in the last 168 hours. No results for input(s): LIPASE, AMYLASE in the last 168 hours. No results for input(s): AMMONIA in the last 168 hours. CBC: Recent Labs  Lab 11/02/20 0433  WBC 6.1  HGB 10.8*  HCT 34.8*  MCV 81.5  PLT 453*   Cardiac Enzymes: No results for input(s): CKTOTAL, CKMB, CKMBINDEX, TROPONINI in the last 168 hours. BNP (last 3 results) No results for input(s): BNP in the last 8760 hours.  ProBNP (last 3 results) No results for input(s): PROBNP in the last 8760 hours.  CBG: Recent Labs  Lab 11/03/20 1642 11/03/20 1921 11/03/20 2334 11/04/20 0307 11/04/20 0723  GLUCAP 92 113* 133* 152* 122*    No results found for this or any previous visit (from the past 240 hour(s)).   Studies: No results found.  Scheduled Meds: . amLODipine  10 mg Per Tube Daily  . atorvastatin  40 mg Per Tube  Daily  . baclofen  5 mg Per Tube 2 times per day  . baclofen  7.5 mg Per Tube Daily  . chlorhexidine gluconate (MEDLINE KIT)  15 mL Mouth Rinse BID  . Chlorhexidine Gluconate Cloth  6 each Topical Daily  . enoxaparin (LOVENOX) injection  40 mg Subcutaneous Q24H  . feeding supplement (PROSource TF)  45 mL Per Tube Daily  . free water  100 mL Per Tube Q4H  . hydrALAZINE  50 mg Per Tube Q8H  . insulin aspart  0-9 Units Subcutaneous Q4H  . mouth rinse  15 mL Mouth Rinse 5 X Daily  . metoprolol tartrate  100 mg Per Tube BID  . mirabegron ER  25 mg Oral Daily  . modafinil  200 mg Per Tube Daily  . pantoprazole sodium  40 mg Per Tube BID  . scopolamine  1 patch Transdermal Q72H  . sennosides  5 mL Per Tube QHS   Continuous Infusions: . sodium chloride Stopped (10/26/20 1445)  . feeding supplement (OSMOLITE 1.2 CAL) 1,000 mL (11/04/20 0726)    Principal Problem:   ICH (intracerebral hemorrhage) (HCC) Active Problems:   Intraparenchymal hemorrhage of brain (HCC)   Acute hypoxemic respiratory failure (HCC)   Fever   Endotracheal tube present   Status post tracheostomy ([place])   Palliative care by specialist   Muscle hypertonicity   Oropharyngeal dysphagia   Brain injury with loss of consciousness ([place])   Poor prognosis   Aspiration pneumonia of right lower lobe (HCC)   Gram-negative infection   Abnormal urinalysis   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   Acute cystitis without hematuria   Consultants:  Palliative care  Pulm  Neurology   Procedures:  Tracheostomy  PEG tube  EEG  Echocardiogram  Antibiotics: Anti-infectives (From admission, onward)   Start     Dose/Rate Route Frequency Ordered Stop   10/27/20 0845  meropenem (MERREM) 1 g in sodium chloride 0.9 % 100 mL IVPB  Status:  Discontinued        1 g 200 mL/hr over 30 Minutes Intravenous Every 8 hours 10/27/20 0752 11/01/20 0720   10/26/20 1145   cefTRIAXone (ROCEPHIN) 1 g in sodium chloride 0.9 % 100 mL IVPB  Status:  Discontinued        1 g 200 mL/hr over 30 Minutes Intravenous Every 24 hours 10/26/20 1056 10/27/20 0752   10/14/20 1815  Ampicillin-Sulbactam (UNASYN) 3 g in sodium chloride 0.9 % 100 mL IVPB        3 g 200 mL/hr over 30 Minutes Intravenous Every 6 hours 10/14/20 1754 10/19/20 1340   10/08/20 0845  ciprofloxacin (CIPRO) tablet 500 mg  Status:  Discontinued        500 mg Per Tube 2 times daily 10/08/20 0754 10/14/20 1731   09/21/20 1545  ceFAZolin (ANCEF) IVPB 1 g/50 mL premix        1 g 100 mL/hr over 30 Minutes Intravenous Every 8 hours 09/21/20 1457 09/28/20 0647   09/20/20 0600  ceFAZolin (ANCEF) IVPB 1 g/50 mL premix  Status:  Discontinued        1 g 100 mL/hr over 30 Minutes Intravenous Every 8 hours 09/14/20 0903 09/14/20 0904   09/16/20 1300  vancomycin (VANCOREADY) IVPB 1250 mg/250 mL  Status:  Discontinued        1,250 mg 166.7 mL/hr over 90 Minutes Intravenous Every 24 hours 09/15/20 1232 09/17/20 0956   09/15/20 1230  vancomycin (VANCOREADY) IVPB 1250 mg/250 mL        1,250 mg 166.7 mL/hr over 90 Minutes Intravenous  Once 09/15/20 1131 09/15/20 1402   09/13/20 1400  ceFEPIme (MAXIPIME) 2 g in sodium chloride 0.9 % 100 mL IVPB        2 g 200 mL/hr over 30 Minutes Intravenous Every 8 hours 09/13/20 1341 09/19/20 2208   09/06/20 0600  vancomycin (VANCOREADY) IVPB 1250 mg/250 mL        1,250 mg 166.7 mL/hr over 90 Minutes Intravenous  Once 09/06/20 0541 09/06/20 0739   09/05/20 1400  ceFAZolin (ANCEF) IVPB 1 g/50 mL premix  Status:  Discontinued        1 g 100 mL/hr over 30 Minutes Intravenous Every 8 hours 09/05/20 1120 09/13/20 1341   08/30/20 0600  vancomycin (VANCOREADY) IVPB 500 mg/100 mL  Status:  Discontinued        500 mg 100 mL/hr over 60 Minutes Intravenous Every 12 hours 08/29/20 1554 09/01/20 1115   08/29/20 1700  ceFEPIme (MAXIPIME) 2 g in sodium chloride 0.9 % 100 mL IVPB  Status:   Discontinued        2 g 200 mL/hr over 30 Minutes Intravenous Every 12 hours 08/29/20 1554 09/01/20 1115   08/29/20 1645  vancomycin (VANCOREADY) IVPB 1250 mg/250 mL        1,250 mg 166.7 mL/hr over 90 Minutes Intravenous  Once 08/29/20 1554 08/29/20 1820       Time spent: 25 minutes    Erin Hearing ANP  [facility] 7 am - 330 pm/M-F for direct patient care and secure chat Please refer to Amion for contact info 69  days

## 2020-11-04 NOTE — Progress Notes (Signed)
Physical Therapy Treatment Patient Details Name: Charlene [name] MRN: [id number] DOB: 12/24/1965 Today's Date: 11/04/2020    History of Present Illness Pt is 55 y/o female presents to [facility] on 2/9 with L sided weakness and headaches. PMH includes anxiety, HTN. 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 s/p Right frontal ventricular catheter placement on 2/10, stopped functioning on 2/11.  MRI 2/11 shows right greater than left mesial temporal lobes / parahippocampal cortex, bilateral basal ganglia, and possibly the upper pons that are concerning for acute infarcts, most likely secondary to mass effect from the hemorrhage. ETT 2/10-present.  Pt with trach and PEG on 09/10/20. Hospital course complicated by persistent fever, Serratia pneumonia and now pre-orbital cellulitis.    PT Comments    Pt was seen for ROM and extended stretches on BLE's with improvement in knee ext and reduced tone driven hip flexion.  Pt is limited by lack of AROM in LE's and continues to need repositioning and use of PRAFO boots to protect heels.  Pt is also positioned with knees extended to reduce her contractures with pillows.  Continue to work on [employer] but will recommend her to have a 1.5 hour limit due to her inability to ask for return to bed.  Follow Up Recommendations  SNF     Equipment Recommendations  Hospital bed    Recommendations for Other Services Rehab consult     Precautions / Restrictions Precautions Precautions: Fall Precaution Comments: peg and trach Restrictions Weight Bearing Restrictions: No    Mobility  Bed Mobility Overal bed mobility: Needs Assistance             General bed mobility comments: did not roll but did scoot up in bed with max assist    Transfers Overall transfer level: Needs assistance               General  transfer comment: pt was sleepy today from being up in chair all day yesterday  Ambulation/Gait             General Gait Details: non ambulatory   Stairs             Wheelchair Mobility    Modified Rankin (Stroke Patients Only) Modified Rankin (Stroke Patients Only) Pre-Morbid Rankin Score: No symptoms Modified Rankin: Severe disability     Balance Overall balance assessment: Needs assistance                                          Cognition Arousal/Alertness: Awake/alert Behavior During Therapy: Flat affect Overall Cognitive Status: Difficult to assess                                        Exercises General Exercises - Lower Extremity Ankle Circles/Pumps: PROM;5 reps Heel Slides: PROM;10 reps Hip ABduction/ADduction: PROM;10 reps Hip Flexion/Marching: PROM;5 reps    General Comments General comments (skin integrity, edema, etc.): pt is lethargic today but did respond with alert grimace with initial movement on RLE.  Family in and talked with PT about pt being up all day yesterday in chair, slept all evening and over today      Pertinent Vitals/Pain Pain Assessment: Faces  Faces Pain Scale: Hurts little more Pain Location: grimace with initial movement on RLE Pain Intervention(s): Limited activity within patient's tolerance;Monitored during session    Home Living                      Prior Function            PT Goals (current goals can now be found in the care plan section) Acute Rehab PT Goals Patient Stated Goal: pt is nonverbal Progress towards PT goals: Not progressing toward goals - comment    Frequency    Min 1X/week      PT Plan Current plan remains appropriate    Co-evaluation              AM-PAC PT "6 Clicks" Mobility   Outcome Measure  Help needed turning from your back to your side while in a flat bed without using bedrails?: Total Help needed moving from lying on your  back to sitting on the side of a flat bed without using bedrails?: Total Help needed moving to and from a bed to a chair (including a wheelchair)?: Total Help needed standing up from a chair using your arms (e.g., wheelchair or bedside chair)?: Total Help needed to walk in hospital room?: Total Help needed climbing 3-5 steps with a railing? : Total 6 Click Score: 6    End of Session Equipment Utilized During Treatment: Oxygen Activity Tolerance: Patient tolerated treatment well Patient left: with call bell/phone within reach;in chair;with chair alarm set Nurse Communication: Mobility status;Need for lift equipment PT Visit Diagnosis: Hemiplegia and hemiparesis;Other symptoms and signs involving the nervous system (R29.898);Other abnormalities of gait and mobility (R26.89) Hemiplegia - Right/Left: Right Hemiplegia - dominant/non-dominant: Dominant Hemiplegia - caused by: Cerebral infarction;Nontraumatic intracerebral hemorrhage;Other Nontraumatic intracranial hemorrhage     Time: 1214-1243 PT Time Calculation (min) (ACUTE ONLY): 29 min  Charges:  $Therapeutic Exercise: 23-37 mins                 Ivar Drape 11/04/2020, 7:40 PM  Samul Dada, PT MS Acute Rehab Dept. Number: [facility] [id number] and [facility] [phone]

## 2020-11-04 NOTE — Progress Notes (Addendum)
[facility] PROGRESS NOTE  Patient: Charlene [name] EZM:[id number]   PCP: Patient, [name] (Inactive) DOB: Jul 23, 1965   DOA: 08/26/2020   DOS: 11/04/2020    Subjective: Seen at bedside.  Eyes open but noncommunicative.  Not following any commands.  Objective:  Vitals:   11/04/20 1456 11/04/20 1556  BP: 132/80 (!) 134/94  Pulse:  88  Resp:  20  Temp:  98.7 F (37.1 C)  SpO2:  100%    Alert and awake.  Nonverbal. No edema. Right gaze preference.  Assessment and plan: 2/9 admission to neurology service with ICU consult for ICH. 2/10 intubated for airway protection.  Ventriculostomy for ICH 2/13 EVD removed. 2/12-2/14  treated with vancomycin and cefepime for central fever. 2/19- 2/26 treated with cefazolin, initially for sinusitis, antibiotic changed to cefepime from 2/26-3/5 due to persistent fever.  Started back on cefazolin finished on 3/13 for bacterial conjunctivitis/periorbital cellulitis. 2/24 PEG tube placed tracheostomy placed. 3/1 transferred out of the unit. 3/24-3/30 on Cipro, antibiotic changed to IV Unasyn completed on 4/5 for aspiration pneumonia. 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 Secondary to HTN Slight obstructive hydrocephalus secondary to IVH. 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  Presented with headache and left-sided weakness.  Found to have ICH.  Admitted to ICU under neurology service with neurosurgery consult. Next day mentation worsened requiring EVD placement and intubation. Drain removed after 3 days on 2/13. Mentation remained poor since then. EEG x2 negative for acute seizures shows evidence of right hemispheric cortical dysfunction. Treated with EVD Last CT scan on 3/9 shows expected evolution of hemorrhage involving right thalamus, BG with  developing encephalomalacia.  Previous IVH resolved.  Persistent 3 mm left-to-right midline shift.  HTN. Blood pressure is actually stable now on 10 mg Norvasc, Lopressor and hydralazine.  HLD. On Lipitor.  Continue statin on discharge.  Excesses secretion. Scopolamine patch placed by PCCM on 2/27. Currently secretion does not appear to be a significant issue.  32 pound weight gain. Body mass index is 27.88 kg/m.  During the hospital stay patient has gained 32 pounds.  135-167.55. Does not appear to be volume overloaded. On tube feeding. Monitor for now.  Dietitian following.  Consultants and follow-up. Neurology nonspecified.  Call at the time of discharge. Neurosurgery.  Nonspecified.  Call at the time of discharge. Palliative care.  Nonspecified.  Benefit from outpatient palliative medicine consult. Inpatient rehab.  Not a candidate for CIR. "she'll have to demonstrate the ability to meaningfully participate in therapies and to show some carryover from day to day. If she goes home with family, extensive education will need to be provided."  If Provigil is ineffective,"can consider a trial of amantadine and ritalin" PCCM continues to follow.  No decannulation.  Outpatient follow-up with trach clinic will be needed.  Mother at bedside.  Discussed in detail regarding plan on 4/20.   Author: Lynden Oxford, MD [facility] 11/04/2020 5:16 PM   If 7PM-7AM, please contact night-coverage at [email or web address]

## 2020-11-05 LAB — GLUCOSE, CAPILLARY
Glucose-Capillary: 123 mg/dL — ABNORMAL HIGH (ref 70–99)
Glucose-Capillary: 141 mg/dL — ABNORMAL HIGH (ref 70–99)
Glucose-Capillary: 103 mg/dL — ABNORMAL HIGH (ref 70–99)
Glucose-Capillary: 148 mg/dL — ABNORMAL HIGH (ref 70–99)
Glucose-Capillary: 127 mg/dL — ABNORMAL HIGH (ref 70–99)

## 2020-11-05 MED ORDER — POLYETHYLENE GLYCOL 3350 17 G PO PACK: 17.0000 g | PACK | Freq: Every day | ORAL | Status: DC | PRN

## 2020-11-05 MED ORDER — OXYBUTYNIN CHLORIDE 5 MG/5ML PO SYRP
5.0000 mg | ORAL_SOLUTION | Freq: Three times a day (TID) | ORAL | Status: DC
  Administered 2020-11-05 – 2020-11-12 (×21): 5 mg
  Filled 2020-11-05 (×26): qty 5

## 2020-11-05 NOTE — Progress Notes (Addendum)
[facility] PROGRESS NOTE  Charlene [name] EUM:[id number] DOB: 03/22/1966 DOA: 08/26/2020 PCP: Patient, [name] (Inactive)    October 08, 2020     10/16/2020                        10/16/2020     10/18/2020                        10/20/2020    Status: Remains inpatient appropriate because:Altered mental status, Unsafe d/c plan, IV treatments appropriate due to intensity of illness or inability to take PO and Inpatient level of care appropriate due to severity of illness   Dispo: The patient is from: Home              Anticipated d/c is to: SNF vs brain injury recover rehab-family does not wish to place patient in SNF.  If this is the only option they would like to consider taking the patient home although this may be a difficult undertaking given current requirement for trach and understandable fears about managing such complex patient in the home environment.  They have been updated as to why this patient currently is not a candidate for aggressive rehabilitative therapies like would be provided at a brain injury recovery rehab.               Patient currently is medically stable to d/c.   Difficult to place patient Yes   Level of care: Progressive  Code Status: Full Family Communication:  extensive conversation via telephone with [name] [phone]) at bedside on 4/1.; [name] [phone]) is her POA and legal contact.  4/21 husband at bedside DVT prophylaxis: enoxaparin (LOVENOX) injection 40 mg Start: 09/01/20 1600 SCD's Start: 08/27/20 0026    Vaccination status: Unknown  Micro Data:  2/9 COVID + Influenza negative 2/10 MRSA PCR negative 2/12 BCx 2 > no growth  2/19 BC 1/2 +Stap EPI, 1/2 no growth  10/07/2020 sputum positive for Serratia marcescens 4/8 Enterobacter cloaca and urine culture MDR sensitive only to imipenem and gentamicin   Antimicrobials:  Cefepime 2/12 > 2/15 Vanc 2/12 > 2/15 10/08/2020 ciprofloxacin>>3/30 Unasyn 3/30 >> 4/02 Rocephin  4/11 > 4/12 Meropenem 4/12>4/17   Foley catheter:  No-Purewick discontinued on 4/13  HPI: 55 yo F with 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  Subsequently on 2/10, she developed obtundation and respiratory arrest leading to intubation.  She has remained persistently obtunded, now has a trach and a PEG.  Hospital course complicated by persistent fever, Serratia pneumonia and now pre-orbital cellulitis  As of 3/25 with adjustments in hypertonicity medications patient seems to be improving.  She is also waking up more although limited interaction but both eyes are now open and she tracks with the left eye.  Multiple discussions with husband who has confirmed he has not delegated other family members to be POA and he is the primary legal contact for patient.  He has agreed to let patient's daughter take the lead on completing and following up on Medicaid and disability applications including having these applications sent to her address.   2/09 Admit for [place] 2/10 EVD placed, later intubated due to obtundation and respiratory arrest 2/11 MRI Brain, hypertonic saline d/c'd, EVD not working.  Husband did not consent to PICC  2/13 EVD removed 2/17 [place] discussion with family, PMT 2/24 Bedside trach and PEG 3/1 Transferred  to [facility]  Subjective: Awake and from sleep.  Grimaced initially.  Spontaneous movement of lower extremities while stretching awake.  Made eye contact.  Appeared to be frowning noting that her mother or other family was not yet at bedside.  Mouth suction due to inability to manage oral secretions upon awakening.  Objective: Vitals:   11/05/20 0729 11/05/20 0757  BP: 131/83   Pulse: 84 86  Resp: 17 17  Temp: 98.2 F (36.8 C)   SpO2: 100% 99%   No intake or output data in the 24 hours ending 11/05/20 1036 Filed Weights   10/27/20 0349 10/30/20 0500 10/31/20 0500  Weight: 67 kg 76 kg 76 kg     Exam:  Constitutional: Awaken.  Appears to be frowning but otherwise does not appear to be in acute distress Respiratory: #6.0 cuffless trach, trach collar with FiO2 21% at 5L,O2 sats 97 to 99%.  Normal respiratory effort Cardiovascular: Normotensive, no tachycardia.  No peripheral edema.  Normal heart Abdomen:  PEG tube-LBM 4/20, active bowel sounds.  Tube feedings. Neurologic: CN 2-12 intact except notable with left ocular disconjugate gaze with medial right downward gaze. Lower extremity foot splints in place.  Able to track with right eye. Patient has spontaneous purposeful movement that is gross motor in nature of right upper extremity. Left preferance torticollis and gen hypertonicity controlled. Psych: Awakens to voice and tactile stimulation.  Nonverbal so unable to accurately assess orientation    Assessment/Plan: Acute problems: Multiple acute strokes  Right Thalamus ICH with IVH  Obstructive Hydrocephalus/Acute on persistent metabolic Encephalopathy/brain injury related hypertonicity Presented with headache and left-sided weakness.   Found to have [place].  Admitted to ICU under neurology with neurosurgery consult. Next day mentation worsened requiring EVD placement and intubation. EVD removed after 3 days on 2/13. Mentation remained poor since then -EEG x 2 suggestive of cortical dysfunction in right hemisphere likely 2/2 underlying bleed as well as severe diffuse encephalopathy, nonspecific etiology . -Continue baclofen 5 mg at 8 AM and 4 PM and 7.5 mg at bedtime.  LFTs remain stable with baseline subtle elevation without change since initiation of baclofen.  Plan repeat LFTs around May 4th -Continue Provigil 200 mg daily as recommended by PMR team -Add scheduled Senokot at HS given requirement for SMRs -OOB to chair daily and PROM every 4 hours per nursing staff -Per OT recommendations have made staff aware that patient needs to move to odd numbered side of hall to improve  visual-spatial interaction and acuity.  Neck Contracture/torticollis -Controlled -Continue baclofen as above -- continue PT/OT weekly per their recommendations given patient's inability to effectively participate at this juncture.  Nursing staff performing PROM activities.  Family also instructed and has been performing intermittently. PT eval 4/20         Pt was seen for ROM and extended stretches on BLE's with improvement in knee ext and reduced tone driven hip flexion.  Pt is limited by lack of AROM in LE's and continues to need repositioning and use of PRAFO boots to protect heels.  Pt is also positioned with knees extended to reduce her contractures with pillows.  Continue to work on [employer] but will recommend her to have a 1.5 hour limit due to her inability to ask for return to bed.      OT eval 4/19:            Pt in recliner upon arrival with her mother present. Pt unable to scan with B eyes this  session despite multimodal cues. Passive stretch to B UEs in multiple planes; L elbow and hand with increased tone but hand able to maintain digit extension. Educated pt's mother on gentle passive strecth to L elbow and how to look for any signs of pain during.Pt has L hand splint and pt's mother able to verbalize and demo how to correctly don and doff. Educated pt's mother to chek skin in elbo crease for signs of any moisture or redness that could lead to skin breakdown. OT will continue to follow acutely            Hypertensive emergency/Malignant HTN -Home medications include Coreg and HCTZ which have not been reordered -Blood pressure is actually stable now on 10 mg Norvasc, Lopressor and hydralazine.   Acute hypoxemic respiratory failure secondary to CVA requiring mechanical ventilation and tracheostomy tube -Stable on trach collar and has been transitioned to a #6 cuffless trach -Has significant altered mentation and inability to manage oral secretions at present time is not  a safe candidate for decannulation.  Discussed this with family as major obstacle in discharging to home.  This will also decrease options for SNF placement. -Appreciate trach team assistance in management  -Scopolamine patch placed by PCCM on 2/27. Currently secretion does not appear to be a significant issue.  Pain likely related to underlying hypertonicity -Significantly improved -Morphine IV, oxycodone and Tylenol prn (watch elev LFTs) -Continue baclofen as above  Dysphagia/Nutrition Status: Nutrition Problem: Inadequate oral intake Etiology: inability to eat Signs/Symptoms: NPO status Interventions: Tube feeding consisting of Osmolite 1.2 Cal at 55 cc/h; continue Prosource liquid 45 cc daily, free water 100 cc every 4 hours per tube; will also continue to follow CBGs regularly while on tube feedings and provide SSI Estimated body mass index is 27.88 kg/m as calculated from the following:   Height as of this encounter: '5\' 5"'  (1.651 m).   Weight as of this encounter: 76 kg. If patient eventually discharged to home to teach family how to bolus feed as opposed to utilizing continuous tube feeding infusions.     Other problems: Anasarca-resolved  -bilateral upper extremity edema, minimal LE edema - suspect related to prolonged hospitalization, hypoalbuminemia -upper extremity US -> age indeterminate SVT of right cephalic vein  ESBL positive Enterobacter UTI -> 100,000 colonies-sensitive to gentamicin imipenem, resistant to cefazolin, cefepime Septra and intermediate to Cipro, nitrofurantoin energetically. -Has completed 5 days of meropenem -DC Purewick during acute UTI -Contact isolation -started on Myrbetriq for suspected bladder spasm, change to Oxybutynin per tube   Elevated LFT's - last checked 04/04, Remain minimally elevated but stable without any obstructive pattern -Continue baclofen and statin  Tachycardia/suboptimal BP -Resolved -Was given 1L of NS and vfree water  increased fom 100 c q 4 hrs to 200 cc  -HR and BP improved  Bacterial Conjunctivitis /periorbital cellulitis - MRI 3/7 orbits with L periorbital cellulitis  -- CT head 3/9 with persistent subtotal opacification of paranasal sinuses and mastoid air cells, bubbly lucency at base of R maxillary sinus (likely odontogenic in origin) - CT maxillofacial 3/12 - improving cellulitis  - s/p erythromycin ointment - completed course of Ancef   Chronic pansinusitis/recurrent fevers 2/2 aspiration PNA;History of Serratia Pneumonia  Hospital Acquired Pneumonia:  -Tracheal aspirate from 2/25 and 3/1 with serratia - cefepime 2/27-3/5; repeat sputum cx 3/21 + serratia (likely colonization but w/ recurrent fever opted to treat) -Has completed antibiotics recently for bilateral orbital cellulitis -started on Cipro for tracheal aspirate positive for Serratia  marcescens   -Repeat sinus CT demonstrates progressive complete bilateral maxillary sinus opacification with unchanged complete left sphenoid sinus opacification and right sphenoid sinus fluid c/w chronic state  -CT Chest 3/30 with aspiration PNA-dtr updated at bedside by attending MD- Cipro changed to Unasyn IV-Unasyn now completed -no increased O2 needs   Anemia -Stable, follow  HLD On Lipitor.  Continue statin on discharge.     Data Reviewed: Basic Metabolic Panel: Recent Labs  Lab 11/02/20 0433  NA 138  K 4.0  CL 101  CO2 29  GLUCOSE 113*  BUN 16  CREATININE 0.50  CALCIUM 9.5   Liver Function Tests: No results for input(s): AST, ALT, ALKPHOS, BILITOT, PROT, ALBUMIN in the last 168 hours. No results for input(s): LIPASE, AMYLASE in the last 168 hours. No results for input(s): AMMONIA in the last 168 hours. CBC: Recent Labs  Lab 11/02/20 0433  WBC 6.1  HGB 10.8*  HCT 34.8*  MCV 81.5  PLT 453*   Cardiac Enzymes: No results for input(s): CKTOTAL, CKMB, CKMBINDEX, TROPONINI in the last 168 hours. BNP (last 3 results) No  results for input(s): BNP in the last 8760 hours.  ProBNP (last 3 results) No results for input(s): PROBNP in the last 8760 hours.  CBG: Recent Labs  Lab 11/04/20 1602 11/04/20 1917 11/04/20 2302 11/05/20 0308 11/05/20 0729  GLUCAP 107* 112* 120* 123* 141*    No results found for this or any previous visit (from the past 240 hour(s)).   Studies: No results found.  Scheduled Meds: . amLODipine  10 mg Per Tube Daily  . atorvastatin  40 mg Per Tube Daily  . baclofen  5 mg Per Tube 2 times per day  . baclofen  7.5 mg Per Tube Daily  . chlorhexidine gluconate (MEDLINE KIT)  15 mL Mouth Rinse BID  . Chlorhexidine Gluconate Cloth  6 each Topical Daily  . enoxaparin (LOVENOX) injection  40 mg Subcutaneous Q24H  . feeding supplement (PROSource TF)  45 mL Per Tube Daily  . free water  100 mL Per Tube Q4H  . hydrALAZINE  50 mg Per Tube Q8H  . insulin aspart  0-9 Units Subcutaneous Q4H  . mouth rinse  15 mL Mouth Rinse 5 X Daily  . metoprolol tartrate  100 mg Per Tube BID  . mirabegron ER  25 mg Oral Daily  . modafinil  200 mg Per Tube Daily  . pantoprazole sodium  40 mg Per Tube BID  . scopolamine  1 patch Transdermal Q72H  . sennosides  5 mL Per Tube QHS   Continuous Infusions: . sodium chloride Stopped (10/26/20 1445)  . feeding supplement (OSMOLITE 1.2 CAL) 1,000 mL (11/05/20 0517)    Principal Problem:   ICH (intracerebral hemorrhage) (HCC) Active Problems:   Intraparenchymal hemorrhage of brain (HCC)   Acute hypoxemic respiratory failure (HCC)   Fever   Endotracheal tube present   Status post tracheostomy ([place])   Palliative care by specialist   Muscle hypertonicity   Oropharyngeal dysphagia   Brain injury with loss of consciousness ([place])   Poor prognosis   Aspiration pneumonia of right lower lobe (HCC)   Gram-negative infection   Abnormal urinalysis   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   Acute  cystitis without hematuria   Consultants:  Neurology no follow up specified. We'll call at the time of discharge.  Neurosurgery.  No follow up specified. We'll Call at the time of discharge.  Palliative care.  No follow up specified. Pt will benefit from outpatient palliative medicine consult.  Inpatient rehab.  Not a candidate for CIR. "she'll have to demonstrate the ability to meaningfully participate in therapies and to show some carryover from day to day. If she goes home with family, extensive education will need to be provided."  If Provigil is ineffective,"can consider a trial of amantadine and ritalin"  PCCM continues to follow.  No decannulation.  Outpatient follow-up with trach clinic will be needed.   Procedures:  Tracheostomy  PEG tube  EEG  Echocardiogram  Antibiotics: . Anti-infectives (From admission, onward)   .  Marland Kitchen Start .   .   Zada Girt . Route . Frequency . Ordered . Stop  .  . 10/27/20 0845 .  Marland Kitchen meropenem (MERREM) 1 g in sodium chloride 0.9 % 100 mL IVPB  Status:  Discontinued      .   . 1 g . 200 mL/hr over 30 Minutes . Intravenous . Every 8 hours . 10/27/20 3299 . 11/01/20 0720  .  . 10/26/20 1145 .  Marland Kitchen cefTRIAXone (ROCEPHIN) 1 g in sodium chloride 0.9 % 100 mL IVPB  Status:  Discontinued      .   . 1 g . 200 mL/hr over 30 Minutes . Intravenous . Every 24 hours . 10/26/20 1056 . 10/27/20 2426  .  . 10/14/20 1815 .  Marland Kitchen Ampicillin-Sulbactam (UNASYN) 3 g in sodium chloride 0.9 % 100 mL IVPB      .   . 3 g . 200 mL/hr over 30 Minutes . Intravenous . Every 6 hours . 10/14/20 1754 . 10/19/20 1340  .  Marland Kitchen 10/08/20 0845 .  . ciprofloxacin (CIPRO) tablet 500 mg  Status:  Discontinued      .   Marland Kitchen 500 mg . Per Tube . 2 times daily . 10/08/20 0754 . 10/14/20 1731  .  Marland Kitchen 09/21/20 1545 .  Marland Kitchen ceFAZolin (ANCEF) IVPB 1 g/50 mL premix      .   . 1 g . 100 mL/hr over 30 Minutes . Intravenous . Every 8 hours . 09/21/20 1457 . 09/28/20 8341  .  Marland Kitchen 09/20/20 0600 .  Marland Kitchen ceFAZolin  (ANCEF) IVPB 1 g/50 mL premix  Status:  Discontinued      .   . 1 g . 100 mL/hr over 30 Minutes . Intravenous . Every 8 hours . 09/14/20 9622 . 09/14/20 2979  .  Marland Kitchen 09/16/20 1300 .  Marland Kitchen vancomycin (VANCOREADY) IVPB 1250 mg/250 mL  Status:  Discontinued      .   . 1,250 mg . 166.7 mL/hr over 90 Minutes . Intravenous . Every 24 hours . 09/15/20 1232 . 09/17/20 8921  .  Marland Kitchen 09/15/20 1230 .  Marland Kitchen vancomycin (VANCOREADY) IVPB 1250 mg/250 mL      .   . 1,250 mg . 166.7 mL/hr over 90 Minutes . Intravenous .  Once . 09/15/20 1131 . 09/15/20 1402  .  . 09/13/20 1400 .  Marland Kitchen ceFEPIme (MAXIPIME) 2 g in sodium chloride 0.9 % 100 mL IVPB      .   . 2 g . 200 mL/hr over 30 Minutes . Intravenous . Every 8 hours . 09/13/20 1341 . 09/19/20 2208  .  . 09/06/20 0600 .  Marland Kitchen vancomycin (VANCOREADY) IVPB 1250 mg/250 mL      .   . 1,250 mg . 166.7 mL/hr over 90 Minutes . Intravenous .  Once . 09/06/20 0541 .  09/06/20 0739  .  . 09/05/20 1400 .  Marland Kitchen ceFAZolin (ANCEF) IVPB 1 g/50 mL premix  Status:  Discontinued      .   . 1 g . 100 mL/hr over 30 Minutes . Intravenous . Every 8 hours . 09/05/20 1120 . 09/13/20 1341  .  . 08/30/20 0600 .  Marland Kitchen vancomycin (VANCOREADY) IVPB 500 mg/100 mL  Status:  Discontinued      .   Marland Kitchen 500 mg . 100 mL/hr over 60 Minutes . Intravenous . Every 12 hours . 08/29/20 1554 . 09/01/20 1115  .  . 08/29/20 1700 .  Marland Kitchen ceFEPIme (MAXIPIME) 2 g in sodium chloride 0.9 % 100 mL IVPB  Status:  Discontinued      .   . 2 g . 200 mL/hr over 30 Minutes . Intravenous . Every 12 hours . 08/29/20 1554 . 09/01/20 1115  .  . 08/29/20 1645 .  Marland Kitchen vancomycin (VANCOREADY) IVPB 1250 mg/250 mL      .   . 1,250 mg . 166.7 mL/hr over 90 Minutes . Intravenous .  Once . 08/29/20 1554 . 08/29/20 1820   .   Marland Kitchen    Time spent: 25 minutes    Erin Hearing ANP  [facility] 7 am - 330 pm/M-F for direct patient care and secure chat Please refer to [place] for contact info 70  Days

## 2020-11-05 NOTE — Progress Notes (Deleted)
[facility] PROGRESS NOTE  Charlene [name] FGB:[id number] DOB: [birth year] DOA: 08/26/2020 PCP: Patient, [name] (Inactive)    October 08, 2020     10/16/2020                        10/16/2020     10/18/2020                        10/20/2020    Status: Remains inpatient appropriate because:Altered mental status, Unsafe d/c plan, IV treatments appropriate due to intensity of illness or inability to take PO and Inpatient level of care appropriate due to severity of illness   Dispo: The patient is from: Home              Anticipated d/c is to: SNF vs brain injury-family does not wish to place patient in SNF.  If this is the only option they would like to consider taking the patient home although this may be a difficult undertaking given current requirement for trach and understandable fears about managing such complex patient in the home environment.  They have been updated as to why this patient currently is not a candidate for aggressive rehabilitative therapies like would be provided at a brain injury recovery rehab.               Patient currently is medically stable to d/c.   Difficult to place patient Yes   Level of care: Progressive  Code Status: Full Family Communication:  extensive conversation via telephone with [name] [phone]) at bedside on 4/1.; [name] [phone]) is her POA and legal contact.  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 Subsequently on 2/10, she developed obtundation and respiratory arrest leading to intubation. She has remained persistently obtunded, now has a trach and a PEG. Hospital course complicated by persistent fever,Serratia pneumoniaand now pre-orbital cellulitis  As of 3/25 with adjustments in hypertonicity medications patient seems to be improving.  She is also waking up more although limited interaction but both eyes are now open and she tracks with the left eye.  Multiple discussions with husband who has confirmed he has not delegated other family members to be POA and he is the primary legal contact for patient.  He has agreed to let patient's daughter take the lead on completing and following up on Medicaid and disability applications including having these applications sent to her address.  2/09 Admit for [place] 2/10 EVD placed, later intubated due to obtundation and respiratory arrest 2/11 MRI Brain, hypertonic saline d/c'd, EVD not working. Husband did not consent to PICC  2/13 EVD removed 2/17 [place] discussion with family, PMT 2/24 Bedside trach and PEG 3/1 Transferred to [place]  Subjective:   Objective: Vitals:   11/05/20 0402 11/05/20 0729  BP: 126/76 131/83  Pulse: 84 84  Resp: 18 17  Temp: 99.4 F (37.4 C) 98.2 F (36.8 C)  SpO2: 99% 100%   No intake or output data in the 24 hours ending 11/05/20 0756 Filed Weights   10/27/20 0349 10/30/20 0500 10/31/20 0500  Weight: 67 kg 76 kg 76 kg    Exam:  Constitutional:  Respiratory: #6.0 cuffless trach, trach collar with FiO2 21% at 5L,O2 sats 97 to 99%.   Cardiovascular:  Abdomen:  PEG tube-LBM 4/20  Neurologic: CN 2-12 intact except notable with left ocular disconjugate gaze with medial right downward gaze. Lower extremity foot splints in place.  Able to track with right eye.  Patient has spontaneous purposeful movement that is gross motor in nature of right upper extremity. Left preferance torticollis and gen hypertonicity controlled. 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 -Continue baclofen 5 mg at 8 AM and 4 PM and 7.5 mg at bedtime.  LFTs remain stable with baseline subtle elevation without change since initiation of baclofen. -Continue Provigil 200 mg daily as recommended by PMR team -Add scheduled Senokot at HS given requirement for SMRs -OOB to chair daily and PROM every 4 hours per nursing staff -Per OT recommendations have made staff aware that patient needs to move to odd numbered side of hall to improve visual-spatial interaction and acuity.  Neck Contracture/torticollis -Controlled -Continue baclofen as above -- continue PT/OT weekly per their recommendations given patient's inability to effectively participate at this juncture.  Nursing staff performing PROM activities.  Family also instructed and has been performing intermittently. PT eval 4/20      Pt was seen for ROM and extended stretches on BLE's with improvement in knee ext and reduced tone driven hip flexion.  Pt is limited by lack of AROM in LE's and continues to need repositioning and use of PRAFO boots to protect heels.  Pt is also positioned with knees extended to reduce her contractures with pillows.  Continue to work on [employer] but will recommend her to have a 1.5 hour limit due to her inability to ask for return to bed.      OT eval 4/19:          Pt in recliner upon arrival with her mother present. Pt unable to scan with B eyes this session despite  multimodal cues. Passive stretch to B UEs in multiple planes; L elbow and hand with increased tone but hand able to maintain digit extension. Educated pt's mother on gentle passive strecth to L elbow and how to look for any signs of pain during.Pt has L hand splint and pt's mother able to verbalize and demo how to correctly don and doff. Educated pt's mother to chek skin in elbo crease for signs of any moisture or redness that could lead to skin breakdown. OT will continue to follow acutely            Hypertensive emergency/Malignant HTN -Home medications include Coreg and HCTZ which have not been reordered -Continue low-dose Norvasc as well as metoprolol for tachycardia  Acute hypoxemic respiratory failure secondary to CVA requiring mechanical ventilation and tracheostomy tube -Stable on trach collar and has been transitioned to a #6 cuffless trach -Has significant altered mentation and inability to manage oral secretions at present time is  not a safe candidate for decannulation.  Discussed this with family as major obstacle in discharging to home.  This will also decrease options for SNF placement. -Appreciate trach team assistance in management  Pain likely related to underlying hypertonicity -Significantly improved -Morphine IV, oxycodone and Tylenol prn (watch elev LFTs) -Continue baclofen as above  Dysphagia/Nutrition Status: Nutrition Problem: Inadequate oral intake Etiology: inability to eat Signs/Symptoms: NPO status Interventions: Tube feeding consisting of Osmolite 1.2 Cal at 55 cc/h; continue Prosource liquid 45 cc daily, free water 100 cc every 4 hours per tube; will also continue to follow CBGs regularly while on tube feedings and provide SSI Estimated body mass index is 27.88 kg/m as calculated from the following:   Height as of this encounter: '5\' 5"'  (1.651 m).   Weight as of this encounter: 76 kg. If patient eventually discharged to home to teach family how to bolus  feed as opposed to utilizing continuous tube feeding infusions.     Other problems: Anasarca -bilateral upper extremity edema, minimal LE edema - suspect related to prolonged hospitalization, hypoalbuminemia -upper extremity US-> age indeterminate SVT of right cephalic vein  ESBL positive Enterobacter UTI -> 100,000 colonies-sensitive to gentamicin imipenem, resistant to cefazolin, cefepime Septra and intermediate to Cipro, nitrofurantoin energetically. -Has completed 5 days of meropenem -DC Purewick during acute UTI -Contact isolation -Continue Myrbetriq for suspected bladder spasm  Elevated LFT's - Remain elevated but stable without any obstructive pattern -Continue baclofen and statin  Tachycardia/suboptimal BP -Resolved -Was given 1L of NS and vfree water increased fom 100 c q 4 hrs to 200 cc  -HR and BP improved -Norvasc was decreased to 2.5 mg 4/8  Bacterial Conjunctivitis/periorbital cellulitis - MRI 3/7 orbits with L periorbital cellulitis  -- CT head 3/9 with persistent subtotal opacification of paranasal sinuses and mastoid air cells, bubbly lucency at base of R maxillary sinus (likely odontogenic in origin) - CT maxillofacial 3/12 - improving cellulitis  - s/p erythromycin ointment - completed course of Ancef   Chronic pansinusitis/recurrent fevers 2/2 aspiration PNA;History of Serratia Pneumonia  Hospital Acquired Pneumonia:  -Tracheal aspirate from 2/25 and 3/1 with serratia - cefepime 2/27-3/5; repeat sputum cx 3/21 + serratia (likely colonization but w/ recurrent fever opted to treat) -Has completed antibiotics recently for bilateral orbital cellulitis -started on Cipro for tracheal aspirate positive for Serratia marcescens   -Repeat sinus CT demonstrates progressive complete bilateral maxillary sinus opacification with unchanged complete left sphenoid sinus opacification and right sphenoid sinus fluid c/w chronic state  -CT Chest 3/30 with aspiration  PNA-dtr updated at bedside by attending MD- Cipro changed to Unasyn IV-Unasyn now completed -no increased O2 needs  Anemia -Stable, follow     Data Reviewed: Basic Metabolic Panel: Recent Labs  Lab 11/02/20 0433  NA 138  K 4.0  CL 101  CO2 29  GLUCOSE 113*  BUN 16  CREATININE 0.50  CALCIUM 9.5   Liver Function Tests: No results for input(s): AST, ALT, ALKPHOS, BILITOT, PROT, ALBUMIN in the last 168 hours. No results for input(s): LIPASE, AMYLASE in the last 168 hours. No results for input(s): AMMONIA in the last 168 hours. CBC: Recent Labs  Lab 11/02/20 0433  WBC 6.1  HGB 10.8*  HCT 34.8*  MCV 81.5  PLT 453*   Cardiac Enzymes: No results for input(s): CKTOTAL, CKMB, CKMBINDEX, TROPONINI in the last 168 hours. BNP (last 3 results) No results for input(s): BNP in the last 8760 hours.  ProBNP (last 3 results) No results for  input(s): PROBNP in the last 8760 hours.  CBG: Recent Labs  Lab 11/04/20 1602 11/04/20 1917 11/04/20 2302 11/05/20 0308 11/05/20 0729  GLUCAP 107* 112* 120* 123* 141*    No results found for this or any previous visit (from the past 240 hour(s)).   Studies: No results found.  Scheduled Meds: . amLODipine  10 mg Per Tube Daily  . atorvastatin  40 mg Per Tube Daily  . baclofen  5 mg Per Tube 2 times per day  . baclofen  7.5 mg Per Tube Daily  . chlorhexidine gluconate (MEDLINE KIT)  15 mL Mouth Rinse BID  . Chlorhexidine Gluconate Cloth  6 each Topical Daily  . enoxaparin (LOVENOX) injection  40 mg Subcutaneous Q24H  . feeding supplement (PROSource TF)  45 mL Per Tube Daily  . free water  100 mL Per Tube Q4H  . hydrALAZINE  50 mg Per Tube Q8H  . insulin aspart  0-9 Units Subcutaneous Q4H  . mouth rinse  15 mL Mouth Rinse 5 X Daily  . metoprolol tartrate  100 mg Per Tube BID  . mirabegron ER  25 mg Oral Daily  . modafinil  200 mg Per Tube Daily  . pantoprazole sodium  40 mg Per Tube BID  . scopolamine  1 patch Transdermal  Q72H  . sennosides  5 mL Per Tube QHS   Continuous Infusions: . sodium chloride Stopped (10/26/20 1445)  . feeding supplement (OSMOLITE 1.2 CAL) 1,000 mL (11/05/20 0517)    Principal Problem:   ICH (intracerebral hemorrhage) (HCC) Active Problems:   Intraparenchymal hemorrhage of brain (HCC)   Acute hypoxemic respiratory failure (HCC)   Fever   Endotracheal tube present   Status post tracheostomy ([place])   Palliative care by specialist   Muscle hypertonicity   Oropharyngeal dysphagia   Brain injury with loss of consciousness ([place])   Poor prognosis   Aspiration pneumonia of right lower lobe (HCC)   Gram-negative infection   Abnormal urinalysis   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   Acute cystitis without hematuria   Consultants:  Palliative care  Pulm  Neurology   Procedures:  Tracheostomy  PEG tube  EEG  Echocardiogram  Antibiotics: Anti-infectives (From admission, onward)   Start     Dose/Rate Route Frequency Ordered Stop   10/27/20 0845  meropenem (MERREM) 1 g in sodium chloride 0.9 % 100 mL IVPB  Status:  Discontinued        1 g 200 mL/hr over 30 Minutes Intravenous Every 8 hours 10/27/20 0752 11/01/20 0720   10/26/20 1145  cefTRIAXone (ROCEPHIN) 1 g in sodium chloride 0.9 % 100 mL IVPB  Status:  Discontinued        1 g 200 mL/hr over 30 Minutes Intravenous Every 24 hours 10/26/20 1056 10/27/20 0752   10/14/20 1815  Ampicillin-Sulbactam (UNASYN) 3 g in sodium chloride 0.9 % 100 mL IVPB        3 g 200 mL/hr over 30 Minutes Intravenous Every 6 hours 10/14/20 1754 10/19/20 1340   10/08/20 0845  ciprofloxacin (CIPRO) tablet 500 mg  Status:  Discontinued        500 mg Per Tube 2 times daily 10/08/20 0754 10/14/20 1731   09/21/20 1545  ceFAZolin (ANCEF) IVPB 1 g/50 mL premix        1 g 100 mL/hr over 30 Minutes Intravenous Every 8 hours 09/21/20 1457 09/28/20 0647   09/20/20 0600  ceFAZolin (ANCEF)  IVPB 1  g/50 mL premix  Status:  Discontinued        1 g 100 mL/hr over 30 Minutes Intravenous Every 8 hours 09/14/20 0903 09/14/20 0904   09/16/20 1300  vancomycin (VANCOREADY) IVPB 1250 mg/250 mL  Status:  Discontinued        1,250 mg 166.7 mL/hr over 90 Minutes Intravenous Every 24 hours 09/15/20 1232 09/17/20 0956   09/15/20 1230  vancomycin (VANCOREADY) IVPB 1250 mg/250 mL        1,250 mg 166.7 mL/hr over 90 Minutes Intravenous  Once 09/15/20 1131 09/15/20 1402   09/13/20 1400  ceFEPIme (MAXIPIME) 2 g in sodium chloride 0.9 % 100 mL IVPB        2 g 200 mL/hr over 30 Minutes Intravenous Every 8 hours 09/13/20 1341 09/19/20 2208   09/06/20 0600  vancomycin (VANCOREADY) IVPB 1250 mg/250 mL        1,250 mg 166.7 mL/hr over 90 Minutes Intravenous  Once 09/06/20 0541 09/06/20 0739   09/05/20 1400  ceFAZolin (ANCEF) IVPB 1 g/50 mL premix  Status:  Discontinued        1 g 100 mL/hr over 30 Minutes Intravenous Every 8 hours 09/05/20 1120 09/13/20 1341   08/30/20 0600  vancomycin (VANCOREADY) IVPB 500 mg/100 mL  Status:  Discontinued        500 mg 100 mL/hr over 60 Minutes Intravenous Every 12 hours 08/29/20 1554 09/01/20 1115   08/29/20 1700  ceFEPIme (MAXIPIME) 2 g in sodium chloride 0.9 % 100 mL IVPB  Status:  Discontinued        2 g 200 mL/hr over 30 Minutes Intravenous Every 12 hours 08/29/20 1554 09/01/20 1115   08/29/20 1645  vancomycin (VANCOREADY) IVPB 1250 mg/250 mL        1,250 mg 166.7 mL/hr over 90 Minutes Intravenous  Once 08/29/20 1554 08/29/20 1820       Time spent: 25 minutes    Erin Hearing ANP  [facility] 7 am - 330 pm/M-F for direct patient care and secure chat Please refer to Amion for contact info 70  days

## 2020-11-06 LAB — GLUCOSE, CAPILLARY
Glucose-Capillary: 140 mg/dL — ABNORMAL HIGH (ref 70–99)
Glucose-Capillary: 137 mg/dL — ABNORMAL HIGH (ref 70–99)
Glucose-Capillary: 149 mg/dL — ABNORMAL HIGH (ref 70–99)
Glucose-Capillary: 148 mg/dL — ABNORMAL HIGH (ref 70–99)
Glucose-Capillary: 98 mg/dL (ref 70–99)
Glucose-Capillary: 102 mg/dL — ABNORMAL HIGH (ref 70–99)
Glucose-Capillary: 162 mg/dL — ABNORMAL HIGH (ref 70–99)

## 2020-11-06 NOTE — Plan of Care (Signed)
  Problem: Ischemic Stroke/TIA Tissue Perfusion: Goal: Complications of ischemic stroke/TIA will be minimized Outcome: Progressing   

## 2020-11-06 NOTE — Plan of Care (Signed)
  Problem: Education: Goal: Knowledge of disease or condition will improve Outcome: Progressing Goal: Knowledge of secondary prevention will improve Outcome: Progressing Goal: Knowledge of patient specific risk factors addressed and post discharge goals established will improve Outcome: Progressing   Problem: Coping: Goal: Will verbalize positive feelings about self Outcome: Progressing Goal: Will identify appropriate support needs Outcome: Progressing   Problem: Clinical Measurements: Goal: Ability to maintain clinical measurements within normal limits will improve Outcome: Progressing Goal: Will remain free from infection Outcome: Progressing Goal: Diagnostic test results will improve Outcome: Progressing Goal: Respiratory complications will improve Outcome: Progressing Goal: Cardiovascular complication will be avoided Outcome: Progressing

## 2020-11-06 NOTE — Progress Notes (Signed)
[facility] PROGRESS NOTE  Charlene [name] MWU:[id number] DOB: 07/26/1965 DOA: 08/26/2020 PCP: Patient, [name] (Inactive)    October 08, 2020     10/16/2020                        10/16/2020     10/18/2020                        10/20/2020    Status: Remains inpatient appropriate because:Altered mental status, Unsafe d/c plan, IV treatments appropriate due to intensity of illness or inability to take PO and Inpatient level of care appropriate due to severity of illness   Dispo: The patient is from: Home              Anticipated d/c is to: SNF vs brain injury recover rehab-family does not wish to place patient in SNF.  If this is the only option they would like to consider taking the patient home although this may be a difficult undertaking given current requirement for trach and understandable fears about managing such complex patient in the home environment.  They have been updated as to why this patient currently is not a candidate for aggressive rehabilitative therapies like would be provided at a brain injury recovery rehab.               Patient currently is medically stable to d/c.   Difficult to place patient Yes   Level of care: Progressive  Code Status: Full Family Communication:  extensive conversation via telephone with [name] [phone]) at bedside on 4/1.; [name] [phone]) is her POA and legal contact.  4/21 husband at bedside DVT prophylaxis: enoxaparin (LOVENOX) injection 40 mg Start: 09/01/20 1600 SCD's Start: 08/27/20 0026   Vaccination status: Unknown  Micro Data:  2/9 COVID + Influenza negative 2/10 MRSA PCR negative 2/12 BCx 2 > no growth  2/19 BC 1/2 +Stap EPI, 1/2 no growth  10/07/2020 sputum positive for Serratia marcescens 4/8 Enterobacter cloaca and urine culture MDR sensitive only to imipenem and gentamicin   Antimicrobials:  Cefepime 2/12 > 2/15 Vanc 2/12 > 2/15 10/08/2020 ciprofloxacin>>3/30 Unasyn 3/30 >> 4/02 Rocephin  4/11 > 4/12 Meropenem 4/12>4/17   Foley catheter:  No-Purewick discontinued on 4/13  HPI: 55 yo F with 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  Subsequently on 2/10, she developed obtundation and respiratory arrest leading to intubation.  She has remained persistently obtunded, now has a trach and a PEG.  Hospital course complicated by persistent fever, Serratia pneumonia and now pre-orbital cellulitis  As of 3/25 with adjustments in hypertonicity medications patient seems to be improving.  She is also waking up more although limited interaction but both eyes are now open and she tracks with the left eye.  Multiple discussions with husband who has confirmed he has not delegated other family members to be POA and he is the primary legal contact for patient.  He has agreed to let patient's daughter take the lead on completing and following up on Medicaid and disability applications including having these applications sent to her address.   2/09 Admit for [place] 2/10 EVD placed, later intubated due to obtundation and respiratory arrest 2/11 MRI Brain, hypertonic saline d/c'd, EVD not working.  Husband did not consent to PICC  2/13 EVD removed 2/17 [place] discussion with family, PMT 2/24 Bedside trach and PEG 3/1 Transferred to  [facility]  Subjective: Awake.  Mother at bedside.  Therapy staff at bedside working with patient.  Objective: Vitals:   11/06/20 0547 11/06/20 0736  BP: 138/83 133/86  Pulse:  93  Resp: 19 19  Temp: 99.5 F (37.5 C) 98.4 F (36.9 C)  SpO2: 100% 98%    Intake/Output Summary (Last 24 hours) at 11/06/2020 0742 Last data filed at 11/06/2020 0300 Gross per 24 hour  Intake 640 ml  Output 1 ml  Net 639 ml   Filed Weights   10/27/20 0349 10/30/20 0500 10/31/20 0500  Weight: 67 kg 76 kg 76 kg    Exam:  Constitutional: Awake, appears to be comfortable and in no acute  distress Respiratory: #6.0 cuffless trach, trach collar with FiO2 21% at 5L,O2 sats 97 to 99%.  Lungs are clear anteriorly.  No increased work of breathing Cardiovascular: Regular pulse without any tachycardia, normal heart sounds, no peripheral edema Abdomen:  PEG tube-LBM 4/20, nontender.  Normoactive bowel sounds.  Tube feedings Neurologic: CN 2-12 intact except notable with left ocular disconjugate gaze with medial right downward gaze. Lower extremity foot splints in place.  Able to track with right eye. Patient has spontaneous purposeful movement that is gross motor in nature of right upper extremity. Left preferance torticollis and gen hypertonicity controlled.  Patient did track this Probation officer in room with her right eye. Psych: Awake.  Nonverbal.  Unable to accurately assess orientation.   Assessment/Plan: Acute problems: Multiple acute strokes  Right Thalamus ICH with IVH  Obstructive Hydrocephalus/Acute on persistent metabolic Encephalopathy/brain injury related hypertonicity Presented with headache and left-sided weakness.   Found to have Cassel.  Admitted to ICU under neurology with neurosurgery consult. Next day mentation worsened requiring EVD placement and intubation. EVD removed after 3 days on 2/13.  -EEG x 2 suggestive of cortical dysfunction in right hemisphere likely 2/2 underlying bleed as well as severe diffuse encephalopathy, nonspecific etiology . -Continue baclofen 5 mg at 8 AM and 4 PM and 7.5 mg at bedtime.  LFTs remain stable with baseline subtle elevation without change since initiation of baclofen.  Plan repeat LFTs around May 4th -Continue Provigil 200 mg daily as recommended by PMR team -Add scheduled Senokot at HS given requirement for SMRs -OOB to chair daily and PROM every 4 hours per nursing staff -Per OT recommendations have made staff aware that patient needs to move to odd numbered side of hall to improve visual-spatial interaction and acuity.  Neck  Contracture/torticollis -Controlled -Continue baclofen as above -- continue PT/OT weekly per their recommendations given patient's inability to effectively participate at this juncture.  Nursing staff performing PROM activities.  Family also instructed and has been performing intermittently. PT eval 4/20         Pt was seen for ROM and extended stretches on BLE's with improvement in knee ext and reduced tone driven hip flexion.  Pt is limited by lack of AROM in LE's and continues to need repositioning and use of PRAFO boots to protect heels.  Pt is also positioned with knees extended to reduce her contractures with pillows.  Continue to work on [employer] but will recommend her to have a 1.5 hour limit due to her inability to ask for return to bed.      OT eval 4/19:            Pt in recliner upon arrival with her mother present. Pt unable to scan with B eyes this session despite multimodal cues. Passive stretch to  B UEs in multiple planes; L elbow and hand with increased tone but hand able to maintain digit extension. Educated pt's mother on gentle passive strecth to L elbow and how to look for any signs of pain during.Pt has L hand splint and pt's mother able to verbalize and demo how to correctly don and doff. Educated pt's mother to chek skin in elbo crease for signs of any moisture or redness that could lead to skin breakdown. OT will continue to follow acutely            Hypertensive emergency/Malignant HTN -Home medications include Coreg and HCTZ which have not been reordered -Blood pressure is actually stable now on 10 mg Norvasc, Lopressor and hydralazine.   Acute hypoxemic respiratory failure secondary to CVA requiring mechanical ventilation and tracheostomy tube -Stable on trach collar and has been transitioned to a #6 cuffless trach -Has significant altered mentation and inability to manage oral secretions at present time is not a safe candidate for decannulation.  Discussed  this with family as major obstacle in discharging to home.  This will also decrease options for SNF placement. -Appreciate trach team assistance in management  -Scopolamine patch placed by PCCM on 2/27. Currently secretion does not appear to be a significant issue.  Pain likely related to underlying hypertonicity -Significantly improved -Morphine IV, oxycodone and Tylenol prn (watch elev LFTs) -Continue baclofen as above  Dysphagia/Nutrition Status: Nutrition Problem: Inadequate oral intake Etiology: inability to eat Signs/Symptoms: NPO status Interventions: Tube feeding consisting of Osmolite 1.2 Cal at 55 cc/h; continue Prosource liquid 45 cc daily, free water 100 cc every 4 hours per tube; will also continue to follow CBGs regularly while on tube feedings and provide SSI Estimated body mass index is 27.88 kg/m as calculated from the following:   Height as of this encounter: '5\' 5"'  (1.651 m).   Weight as of this encounter: 76 kg. If patient eventually discharged to home to teach family how to bolus feed as opposed to utilizing continuous tube feeding infusions.     Other problems: Anasarca-resolved  -bilateral upper extremity edema, minimal LE edema - suspect related to prolonged hospitalization, hypoalbuminemia -upper extremity US -> age indeterminate SVT of right cephalic vein  ESBL positive Enterobacter UTI -> 100,000 colonies-sensitive to gentamicin imipenem, resistant to cefazolin, cefepime Septra and intermediate to Cipro, nitrofurantoin energetically. -Has completed 5 days of meropenem -DC Purewick during acute UTI -Contact isolation -started on Myrbetriq for suspected bladder spasm, change to Oxybutynin per tube   Elevated LFT's - last checked 04/04, Remain minimally elevated but stable without any obstructive pattern -Continue baclofen and statin  Tachycardia/suboptimal BP -Resolved -Was given 1L of NS and vfree water increased fom 100 c q 4 hrs to 200 cc  -HR and  BP improved  Bacterial Conjunctivitis /periorbital cellulitis - MRI 3/7 orbits with L periorbital cellulitis  -- CT head 3/9 with persistent subtotal opacification of paranasal sinuses and mastoid air cells, bubbly lucency at base of R maxillary sinus (likely odontogenic in origin) - CT maxillofacial 3/12 - improving cellulitis  - s/p erythromycin ointment - completed course of Ancef   Chronic pansinusitis/recurrent fevers 2/2 aspiration PNA;History of Serratia Pneumonia  Hospital Acquired Pneumonia:  -Tracheal aspirate from 2/25 and 3/1 with serratia - cefepime 2/27-3/5; repeat sputum cx 3/21 + serratia (likely colonization but w/ recurrent fever opted to treat) -Has completed antibiotics recently for bilateral orbital cellulitis -started on Cipro for tracheal aspirate positive for Serratia marcescens   -Repeat sinus CT demonstrates  progressive complete bilateral maxillary sinus opacification with unchanged complete left sphenoid sinus opacification and right sphenoid sinus fluid c/w chronic state  -CT Chest 3/30 with aspiration PNA-dtr updated at bedside by attending MD- Cipro changed to Unasyn IV-Unasyn now completed -no increased O2 needs   Anemia -Stable, follow  HLD On Lipitor.  Continue statin on discharge.     Data Reviewed: Basic Metabolic Panel: Recent Labs  Lab 11/02/20 0433  NA 138  K 4.0  CL 101  CO2 29  GLUCOSE 113*  BUN 16  CREATININE 0.50  CALCIUM 9.5   Liver Function Tests: No results for input(s): AST, ALT, ALKPHOS, BILITOT, PROT, ALBUMIN in the last 168 hours. No results for input(s): LIPASE, AMYLASE in the last 168 hours. No results for input(s): AMMONIA in the last 168 hours. CBC: Recent Labs  Lab 11/02/20 0433  WBC 6.1  HGB 10.8*  HCT 34.8*  MCV 81.5  PLT 453*   Cardiac Enzymes: No results for input(s): CKTOTAL, CKMB, CKMBINDEX, TROPONINI in the last 168 hours. BNP (last 3 results) No results for input(s): BNP in the last 8760  hours.  ProBNP (last 3 results) No results for input(s): PROBNP in the last 8760 hours.  CBG: Recent Labs  Lab 11/05/20 1548 11/05/20 1940 11/06/20 0148 11/06/20 0543 11/06/20 0734  GLUCAP 148* 127* 140* 137* 149*    No results found for this or any previous visit (from the past 240 hour(s)).   Studies: No results found.  Scheduled Meds: . amLODipine  10 mg Per Tube Daily  . atorvastatin  40 mg Per Tube Daily  . baclofen  5 mg Per Tube 2 times per day  . baclofen  7.5 mg Per Tube Daily  . chlorhexidine gluconate (MEDLINE KIT)  15 mL Mouth Rinse BID  . Chlorhexidine Gluconate Cloth  6 each Topical Daily  . enoxaparin (LOVENOX) injection  40 mg Subcutaneous Q24H  . feeding supplement (PROSource TF)  45 mL Per Tube Daily  . free water  100 mL Per Tube Q4H  . hydrALAZINE  50 mg Per Tube Q8H  . insulin aspart  0-9 Units Subcutaneous Q4H  . mouth rinse  15 mL Mouth Rinse 5 X Daily  . metoprolol tartrate  100 mg Per Tube BID  . modafinil  200 mg Per Tube Daily  . oxybutynin  5 mg Per Tube TID  . pantoprazole sodium  40 mg Per Tube BID  . scopolamine  1 patch Transdermal Q72H  . sennosides  5 mL Per Tube QHS   Continuous Infusions: . sodium chloride Stopped (10/26/20 1445)  . feeding supplement (OSMOLITE 1.2 CAL) 1,000 mL (11/06/20 0118)    Principal Problem:   ICH (intracerebral hemorrhage) (HCC) Active Problems:   Intraparenchymal hemorrhage of brain (HCC)   Acute hypoxemic respiratory failure (HCC)   Fever   Endotracheal tube present   Status post tracheostomy ([place])   Palliative care by specialist   Muscle hypertonicity   Oropharyngeal dysphagia   Brain injury with loss of consciousness ([place])   Poor prognosis   Aspiration pneumonia of right lower lobe (HCC)   Gram-negative infection   Abnormal urinalysis   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   Acute cystitis without  hematuria   Consultants:  Neurology no follow up specified. We'll call at the time of discharge.  Neurosurgery.  No follow up specified. We'll Call at the time of discharge.  Palliative care.  No follow up specified. Pt will  benefit from outpatient palliative medicine consult.  Inpatient rehab.  Not a candidate for CIR. "she'll have to demonstrate the ability to meaningfully participate in therapies and to show some carryover from day to day. If she goes home with family, extensive education will need to be provided."  If Provigil is ineffective,"can consider a trial of amantadine and ritalin"  PCCM continues to follow.  No decannulation.  Outpatient follow-up with trach clinic will be needed.   Procedures:  Tracheostomy  PEG tube  EEG  Echocardiogram  Antibiotics: Anti-infectives (From admission, onward)   Start     Dose/Rate Route Frequency Ordered Stop   10/27/20 0845  meropenem (MERREM) 1 g in sodium chloride 0.9 % 100 mL IVPB  Status:  Discontinued        1 g 200 mL/hr over 30 Minutes Intravenous Every 8 hours 10/27/20 0752 11/01/20 0720   10/26/20 1145  cefTRIAXone (ROCEPHIN) 1 g in sodium chloride 0.9 % 100 mL IVPB  Status:  Discontinued        1 g 200 mL/hr over 30 Minutes Intravenous Every 24 hours 10/26/20 1056 10/27/20 0752   10/14/20 1815  Ampicillin-Sulbactam (UNASYN) 3 g in sodium chloride 0.9 % 100 mL IVPB        3 g 200 mL/hr over 30 Minutes Intravenous Every 6 hours 10/14/20 1754 10/19/20 1340   10/08/20 0845  ciprofloxacin (CIPRO) tablet 500 mg  Status:  Discontinued        500 mg Per Tube 2 times daily 10/08/20 0754 10/14/20 1731   09/21/20 1545  ceFAZolin (ANCEF) IVPB 1 g/50 mL premix        1 g 100 mL/hr over 30 Minutes Intravenous Every 8 hours 09/21/20 1457 09/28/20 0647   09/20/20 0600  ceFAZolin (ANCEF) IVPB 1 g/50 mL premix  Status:  Discontinued        1 g 100 mL/hr over 30 Minutes Intravenous Every 8 hours 09/14/20 0903 09/14/20 0904    09/16/20 1300  vancomycin (VANCOREADY) IVPB 1250 mg/250 mL  Status:  Discontinued        1,250 mg 166.7 mL/hr over 90 Minutes Intravenous Every 24 hours 09/15/20 1232 09/17/20 0956   09/15/20 1230  vancomycin (VANCOREADY) IVPB 1250 mg/250 mL        1,250 mg 166.7 mL/hr over 90 Minutes Intravenous  Once 09/15/20 1131 09/15/20 1402   09/13/20 1400  ceFEPIme (MAXIPIME) 2 g in sodium chloride 0.9 % 100 mL IVPB        2 g 200 mL/hr over 30 Minutes Intravenous Every 8 hours 09/13/20 1341 09/19/20 2208   09/06/20 0600  vancomycin (VANCOREADY) IVPB 1250 mg/250 mL        1,250 mg 166.7 mL/hr over 90 Minutes Intravenous  Once 09/06/20 0541 09/06/20 0739   09/05/20 1400  ceFAZolin (ANCEF) IVPB 1 g/50 mL premix  Status:  Discontinued        1 g 100 mL/hr over 30 Minutes Intravenous Every 8 hours 09/05/20 1120 09/13/20 1341   08/30/20 0600  vancomycin (VANCOREADY) IVPB 500 mg/100 mL  Status:  Discontinued        500 mg 100 mL/hr over 60 Minutes Intravenous Every 12 hours 08/29/20 1554 09/01/20 1115   08/29/20 1700  ceFEPIme (MAXIPIME) 2 g in sodium chloride 0.9 % 100 mL IVPB  Status:  Discontinued        2 g 200 mL/hr over 30 Minutes Intravenous Every 12 hours 08/29/20 1554 09/01/20 1115   08/29/20 1645  vancomycin (VANCOREADY) IVPB 1250 mg/250 mL        1,250 mg 166.7 mL/hr over 90 Minutes Intravenous  Once 08/29/20 1554 08/29/20 1820       Time spent: 25 minutes    Erin Hearing ANP  [facility] 7 am - 330 pm/M-F for direct patient care and secure chat Please refer to [place] for contact info 71  Days

## 2020-11-06 NOTE — Progress Notes (Signed)
Occupational Therapy Treatment Patient Details Name: Charlene [name] MRN: [id number] DOB: 12-Feb-1966 Today's Date: 11/06/2020    History of present illness Pt is 55 y/o female presents to [facility] on 2/9 with L sided weakness and headaches. PMH includes anxiety, HTN. 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 s/p Right frontal ventricular catheter placement on 2/10, stopped functioning on 2/11.  MRI 2/11 shows right greater than left mesial temporal lobes / parahippocampal cortex, bilateral basal ganglia, and possibly the upper pons that are concerning for acute infarcts, most likely secondary to mass effect from the hemorrhage. ETT 2/10-present.  Pt with trach and PEG on 09/10/20. Hospital course complicated by persistent fever, Serratia pneumonia and now pre-orbital cellulitis.   OT comments  After spasticity inhibitory techniques and with facilitation, pt was able to follow simple motor commands with Rt UE and moderate - maximal facilitation and assist to reach for a picture, touch her mouth, apply lotion to Lt UE and to legs.  She requires increased time to initiate movement, as well as assist to inhibit the spasticity and for motor planning deficits.  Occlusion lenses placed on her with nasal portion of Lt lens occluded to reduce possible diplopia.  Pt with immediate increase in ability to track Lt and Rt and to fixate more consistently.  Will continue to follow.   Follow Up Recommendations  SNF;Supervision/Assistance - 24 hour    Equipment Recommendations  None recommended by OT    Recommendations for Other Services      Precautions / Restrictions Precautions Precautions: Fall Precaution Comments: peg and trach       Mobility Bed Mobility Overal bed mobility: Needs Assistance Bed Mobility: Rolling;Sidelying to Sit;Sit to Sidelying Rolling: Max  assist Sidelying to sit: Total assist;+2 for physical assistance     Sit to sidelying: Total assist;+2 for physical assistance General bed mobility comments: with bil. knees bent, and facilitation at scapulas, pt was able to assist with rolling    Transfers Overall transfer level: Needs assistance               General transfer comment: attempted sit to stand.  She was able to lift buttocks ~12" off bed with max A +2, but was unable to achieve full standing    Balance Overall balance assessment: Needs assistance Sitting-balance support: No upper extremity supported;Feet supported Sitting balance-Leahy Scale: Poor Sitting balance - Comments: Pt able to maintain EOB sitting x ~7 mins with min A while working on rotation to the Rt and turning head to fixate and locate items on her Rt                                   ADL either performed or assessed with clinical judgement   ADL                                         General ADL Comments: Pt rubbed lotion onto Lt UE and bil. thighs with mod A     Vision   Additional Comments: occlusion lenses placed on pt with Lt nasal portion of lt lens occluded to reduce possible diplopia.  Once lenses placed pt noted to track Lt and Rt and demonstrated increased  frequency of visual fixation   Perception     Praxis      Cognition Arousal/Alertness: Awake/alert Behavior During Therapy: Flat affect Overall Cognitive Status: Impaired/Different from baseline Area of Impairment: Following commands;Problem solving                       Following Commands: Follows one step commands inconsistently     Problem Solving: Slow processing;Decreased initiation;Requires verbal cues;Requires tactile cues General Comments: Pt demonstrates focused to possibly small periods of sustained attention. She was able to track objects.  She attempted to reach for picture of her kids on command, she attempted to touch  her mouth on command, and rubbed lotion on her Lt arm and legs on command with physical assist to initiate the movement and to inhibit spasticity        Exercises Other Exercises Other Exercises: Pt in supine with knees flexed. Performed trunk rotation to inhibit spasticity. Other Exercises: Worked on functional reach with Rt UE supine.  Pt demonstrates significant motor planning deficits and demonstrates extensor spasticity  - required moderate to maximal facilitation and assist to reach for a picture x 3, touch her mouth x 3, and to suction mouth with yaunker x 2.   Shoulder Instructions       General Comments mother present    Pertinent Vitals/ Pain       Pain Assessment: Faces Faces Pain Scale: Hurts a little bit Pain Location: occasional grimmacing Pain Descriptors / Indicators: Discomfort;Spasm Pain Intervention(s): Monitored during session  Home Living                                          Prior Functioning/Environment              Frequency  Min 2X/week        Progress Toward Goals  OT Goals(current goals can now be found in the care plan section)     Acute Rehab OT Goals Patient Stated Goal: Pt unable to state OT Goal Formulation: Patient unable to participate in goal setting Time For Goal Achievement: 11/20/20 Potential to Achieve Goals: Fair ADL Goals Pt/caregiver will Perform Home Exercise Program: With written HEP provided;Both right and left upper extremity;Increased ROM Additional ADL Goal #1: Family will demonstrate PROM to L UE with independence for contracture prevention. Additional ADL Goal #2: Patient will consistently track with R eye 50% of range (to midline), fixating on given object for 5 seconds in 3/6 trials. Additional ADL Goal #3: Patient will demonstrate ability to give thumbs up with R hand with 50% accuracy given increased time (2-3 minutes).  Plan Discharge plan remains appropriate;Equipment recommendations need  to be updated    Co-evaluation                 AM-PAC OT "6 Clicks" Daily Activity     Outcome Measure   Help from another person eating meals?: Total Help from another person taking care of personal grooming?: Total Help from another person toileting, which includes using toliet, bedpan, or urinal?: Total Help from another person bathing (including washing, rinsing, drying)?: Total Help from another person to put on and taking off regular upper body clothing?: Total Help from another person to put on and taking off regular lower body clothing?: Total 6 Click Score: 6    End of Session    OT Visit Diagnosis:  Muscle weakness (generalized) (M62.81);Apraxia (R48.2);Cognitive communication deficit (R41.841);Hemiplegia and hemiparesis Symptoms and signs involving cognitive functions: Other Nontraumatic ICH Hemiplegia - caused by: Other Nontraumatic intracranial hemorrhage   Activity Tolerance Patient tolerated treatment well   Patient Left in bed;with call bell/phone within reach;with family/visitor present   Nurse Communication Mobility status        Time: 0902-1008 OT Time Calculation (min): 66 min  Charges: OT General Charges $OT Visit: 1 Visit OT Treatments $Neuromuscular Re-education: 53-67 mins  Eber Jones OTR/L Acute Rehabilitation Services Pager [phone] Office [phone]    Jeani Hawking M 11/06/2020, 7:21 PM

## 2020-11-07 LAB — COMPREHENSIVE METABOLIC PANEL
ALT: 69 U/L — ABNORMAL HIGH (ref 0–44)
AST: 41 U/L (ref 15–41)
Albumin: 2.6 g/dL — ABNORMAL LOW (ref 3.5–5.0)
Alkaline Phosphatase: 140 U/L — ABNORMAL HIGH (ref 38–126)
Anion gap: 7 (ref 5–15)
BUN: 18 mg/dL (ref 6–20)
CO2: 30 mmol/L (ref 22–32)
Calcium: 9.1 mg/dL (ref 8.9–10.3)
Chloride: 100 mmol/L (ref 98–111)
Creatinine, Ser: 0.55 mg/dL (ref 0.44–1.00)
GFR, Estimated: >60 mL/min (ref >60)
Glucose, Bld: 129 mg/dL — ABNORMAL HIGH (ref 70–99)
Potassium: 3.6 mmol/L (ref 3.5–5.1)
Sodium: 137 mmol/L (ref 135–145)
Total Bilirubin: 0.3 mg/dL (ref 0.3–1.2)
Total Protein: 6.6 g/dL (ref 6.5–8.1)

## 2020-11-07 LAB — CBC
HCT: 30.8 % — ABNORMAL LOW (ref 36.0–46.0)
Hemoglobin: 9.7 g/dL — ABNORMAL LOW (ref 12.0–15.0)
MCH: 25.9 pg — ABNORMAL LOW (ref 26.0–34.0)
MCHC: 31.5 g/dL (ref 30.0–36.0)
MCV: 82.1 fL (ref 80.0–100.0)
Platelets: 407 10*3/uL — ABNORMAL HIGH (ref 150–400)
RBC: 3.75 MIL/uL — ABNORMAL LOW (ref 3.87–5.11)
RDW: 16.1 % — ABNORMAL HIGH (ref 11.5–15.5)
WBC: 8.1 10*3/uL (ref 4.0–10.5)
nRBC: 0 % (ref 0.0–0.2)

## 2020-11-07 LAB — GLUCOSE, CAPILLARY
Glucose-Capillary: 127 mg/dL — ABNORMAL HIGH (ref 70–99)
Glucose-Capillary: 132 mg/dL — ABNORMAL HIGH (ref 70–99)
Glucose-Capillary: 161 mg/dL — ABNORMAL HIGH (ref 70–99)
Glucose-Capillary: 135 mg/dL — ABNORMAL HIGH (ref 70–99)
Glucose-Capillary: 101 mg/dL — ABNORMAL HIGH (ref 70–99)
Glucose-Capillary: 136 mg/dL — ABNORMAL HIGH (ref 70–99)

## 2020-11-07 MED ORDER — CHLORHEXIDINE GLUCONATE 0.12 % MT SOLN
OROMUCOSAL | Status: AC
  Filled 2020-11-07: qty 15

## 2020-11-07 NOTE — Progress Notes (Signed)
[facility] PROGRESS NOTE  CIERRAH DACE HQP:[id number] DOB: 1966-05-14 DOA: 08/26/2020 PCP: Patient, [name] (Inactive)   Status: Remains inpatient appropriate because:Altered mental status, Unsafe d/c plan, inpatient treatments appropriate due to intensity of illness in the setting of new tracheostomy and PEG tube placement   Dispo: The patient is from: Home              Anticipated d/c is to: SNF vs brain injury recovery rehab-discussed with husband on 4/23 currently working with Education officer, museum for appropriate SNF placement.  He understand that patient will not do well with trach and PEG at home.  CIR currently not option for the patient.              Patient currently is medically stable to d/c.   Difficult to place patient Yes   Level of care: Progressive  Code Status: Full Family Communication:  extensive conversation via telephone with [name] at bedside on 4/1.; [name] is her POA and legal contact.  4/23 husband at bedside answered all questions. DVT prophylaxis: enoxaparin (LOVENOX) injection 40 mg Start: 09/01/20 1600 SCD's Start: 08/27/20 0026   Vaccination status: Unknown  Micro Data:  2/9 COVID + Influenza negative 2/10 MRSA PCR negative 2/12 BCx 2 > no growth  2/19 BC 1/2 +Stap EPI, 1/2 no growth  10/07/2020 sputum positive for Serratia marcescens 4/8 Enterobacter cloaca and urine culture MDR sensitive only to imipenem and gentamicin   Antimicrobials:  Cefepime 2/12 > 2/15 Vanc 2/12 > 2/15 10/08/2020 ciprofloxacin>>3/30 Unasyn 3/30 >> 4/02 Rocephin 4/11 > 4/12 Meropenem 4/12>4/17   Foley catheter:  No-Purewick discontinued on 4/13  HPI: 55 yo F with 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  Subsequently on 2/10, she developed obtundation and respiratory arrest leading to intubation.  She has remained persistently obtunded, now has a trach and a PEG.   Hospital course complicated by persistent fever, Serratia pneumonia and now pre-orbital cellulitis  As of 3/25 with adjustments in hypertonicity medications patient seems to be improving.  She is also waking up more although limited interaction but both eyes are now open and she tracks with the left eye.  Multiple discussions with husband who has confirmed he has not delegated other family members to be POA and he is the primary legal contact for patient.  He has agreed to let patient's daughter take the lead on completing and following up on Medicaid and disability applications including having these applications sent to her address.   2/09 Admit for [place] 2/10 EVD placed, later intubated due to obtundation and respiratory arrest 2/11 MRI Brain, hypertonic saline d/c'd, EVD not working.  2/13 EVD removed 2/17 GOC discussion with family, PMT 2/24 Bedside trach and PEG 3/1 Transferred to [facility]  Subjective: Anxious.  No acute events.  No nausea or vomiting.  Worked well with OT yesterday.  Objective: Vitals:   11/07/20 1133 11/07/20 1206  BP: (!) 142/85   Pulse: 92 93  Resp: 20 18  Temp: 99.1 F (37.3 C)   SpO2:  100%    Intake/Output Summary (Last 24 hours) at 11/07/2020 1537 Last data filed at 11/07/2020 0600 Gross per 24 hour  Intake 2660 ml  Output 0 ml  Net 2660 ml   Filed Weights   10/27/20 0349 10/30/20 0500 10/31/20 0500  Weight: 67 kg 76 kg 76 kg    Exam:  General: Appear in mild distress; no visible Abnormal Neck  Mass Or lumps, Conjunctiva normal Cardiovascular: S1 and S2 Present, no Murmur, Respiratory: good respiratory effort, Bilateral Air entry present and CTA, no Crackles, no wheezes,#6.0 cuffless trach, trach collar with FiO2 21% at 5L,O2 sats 97 to 99%. Abdomen: Bowel Sound present, PEG tube in place Extremities: no Pedal edema Neurology: alert and nonverbal, track with right eye Gait not checked due to patient safety concerns  Assessment/Plan: Acute  problems: Multiple acute strokes  Right Thalamus ICH with IVH  Obstructive Hydrocephalus/Acute on persistent metabolic Encephalopathy/brain injury related hypertonicity Presented with headache and left-sided weakness.   Found to have Liborio Negron Torres.  Admitted to ICU under neurology with neurosurgery consult. Next day mentation worsened requiring EVD placement and intubation. EVD removed after 3 days on 2/13.  -EEG x 2 suggestive of cortical dysfunction in right hemisphere likely 2/2 underlying bleed as well as severe diffuse encephalopathy, nonspecific etiology . -Continue baclofen 5 mg at 8 AM and 4 PM and 7.5 mg at bedtime.  LFTs remain stable with baseline subtle elevation without change since initiation of baclofen.  Plan repeat LFTs around May 4th -Continue Provigil 200 mg daily as recommended by PMR team -Add scheduled Senokot at HS given requirement for SMRs -OOB to chair daily and PROM every 4 hours per nursing staff  Neck Contracture/torticollis -Controlled -Continue baclofen as above  Hypertensive emergency/Malignant HTN -Home medications include Coreg and HCTZ which have not been reordered -Blood pressure is actually stable now on 10 mg Norvasc, Lopressor and hydralazine.   Acute hypoxemic respiratory failure secondary to CVA requiring mechanical ventilation and tracheostomy tube -Stable on trach collar and has been transitioned to a #6 cuffless trach -Has significant altered mentation and inability to manage oral secretions at present time is not a safe candidate for decannulation.  Discussed this with family as major obstacle in discharging to home.  This will also decrease options for SNF placement. -Appreciate trach team assistance in management  -Scopolamine patch placed by PCCM on 2/27. Currently secretion does not appear to be a significant issue.  We will attempt to discontinue on Monday.  Pain likely related to underlying hypertonicity -Significantly improved, lost oxycodone use  4/23 331. -Morphine IV, oxycodone and Tylenol prn -Continue baclofen as above  Dysphagia/Nutrition Status:  Nutrition Problem: Inadequate oral intake Etiology: inability to eat Signs/Symptoms: NPO status Interventions: Tube feeding consisting of Osmolite 1.2 Cal at 55 cc/h; continue Prosource liquid 45 cc daily, free water 100 cc every 4 hours per tube; will also continue to follow CBGs regularly while on tube feedings and provide SSI Estimated body mass index is 27.88 kg/m as calculated from the following:   Height as of this encounter: _0  (1.651 m).   Weight as of this encounter: 76 kg. If patient eventually discharged to home to teach family how to bolus feed as opposed to utilizing continuous tube feeding infusions.  Other problems: Anasarca-resolved  -bilateral upper extremity edema, minimal LE edema - suspect related to prolonged hospitalization, hypoalbuminemia -upper extremity US -> age indeterminate SVT of right cephalic vein  ESBL positive Enterobacter UTI -> 100,000 colonies-sensitive to gentamicin imipenem, resistant to cefazolin, cefepime Septra and intermediate to Cipro, nitrofurantoin energetically. -Has completed 5 days of meropenem -DC Purewick during acute UTI -Contact isolation -started on Myrbetriq for suspected bladder spasm, change to Oxybutynin per tube   Elevated LFT's - last checked 04/04, Remain minimally elevated but stable without any obstructive pattern -Continue baclofen and statin  Tachycardia/suboptimal BP resolved. -Was given 1L of NS and vfree water  increased fom 100 c q 4 hrs to 200 cc  -HR and BP improved  Bacterial Conjunctivitis /periorbital cellulitis - MRI 3/7 orbits with L periorbital cellulitis  - CT head 3/9 with persistent subtotal opacification of paranasal sinuses and mastoid air cells, bubbly lucency at base of R maxillary sinus (likely odontogenic in origin) - CT maxillofacial 3/12 - improving cellulitis  - s/p erythromycin  ointment - completed course of Ancef   Chronic pansinusitis/recurrent fevers 2/2 aspiration PNA;History of Serratia Pneumonia  Hospital Acquired Pneumonia:  -Tracheal aspirate from 2/25 and 3/1 with serratia - cefepime 2/27-3/5; repeat sputum cx 3/21 + serratia (likely colonization but w/ recurrent fever opted to treat) -Has completed antibiotics recently for bilateral orbital cellulitis -started on Cipro for tracheal aspirate positive for Serratia marcescens   -Repeat sinus CT demonstrates progressive complete bilateral maxillary sinus opacification with unchanged complete left sphenoid sinus opacification and right sphenoid sinus fluid c/w chronic state  -CT Chest 3/30 with aspiration PNA-dtr updated at bedside by attending MD- Cipro changed to Unasyn IV-Unasyn now completed -no increased O2 needs   Anemia -Stable, follow  HLD On Lipitor.  Continue statin on discharge.  Significant weight gain.  We will recheck tomorrow.   Data Reviewed: Basic Metabolic Panel: Recent Labs  Lab 11/02/20 0433 11/07/20 0319  NA 138 137  K 4.0 3.6  CL 101 100  CO2 29 30  GLUCOSE 113* 129*  BUN 16 18  CREATININE 0.50 0.55  CALCIUM 9.5 9.1   Liver Function Tests: Recent Labs  Lab 11/07/20 0319  AST 41  ALT 69*  ALKPHOS 140*  BILITOT 0.3  PROT 6.6  ALBUMIN 2.6*   No results for input(s): LIPASE, AMYLASE in the last 168 hours. No results for input(s): AMMONIA in the last 168 hours. CBC: Recent Labs  Lab 11/02/20 0433 11/07/20 0319  WBC 6.1 8.1  HGB 10.8* 9.7*  HCT 34.8* 30.8*  MCV 81.5 82.1  PLT 453* 407*   Cardiac Enzymes: No results for input(s): CKTOTAL, CKMB, CKMBINDEX, TROPONINI in the last 168 hours. BNP (last 3 results) No results for input(s): BNP in the last 8760 hours.  ProBNP (last 3 results) No results for input(s): PROBNP in the last 8760 hours.  CBG: Recent Labs  Lab 11/06/20 2039 11/06/20 2329 11/07/20 0350 11/07/20 0827 11/07/20 1151  GLUCAP 102*  162* 127* 132* 161*    No results found for this or any previous visit (from the past 240 hour(s)).   Studies: No results found.  Scheduled Meds: . amLODipine  10 mg Per Tube Daily  . atorvastatin  40 mg Per Tube Daily  . baclofen  5 mg Per Tube 2 times per day  . baclofen  7.5 mg Per Tube Daily  . chlorhexidine gluconate (MEDLINE KIT)  15 mL Mouth Rinse BID  . Chlorhexidine Gluconate Cloth  6 each Topical Daily  . enoxaparin (LOVENOX) injection  40 mg Subcutaneous Q24H  . feeding supplement (PROSource TF)  45 mL Per Tube Daily  . free water  100 mL Per Tube Q4H  . hydrALAZINE  50 mg Per Tube Q8H  . insulin aspart  0-9 Units Subcutaneous Q4H  . mouth rinse  15 mL Mouth Rinse 5 X Daily  . metoprolol tartrate  100 mg Per Tube BID  . modafinil  200 mg Per Tube Daily  . oxybutynin  5 mg Per Tube TID  . pantoprazole sodium  40 mg Per Tube BID  . scopolamine  1 patch Transdermal Q72H  .  sennosides  5 mL Per Tube QHS   Continuous Infusions: . sodium chloride Stopped (10/26/20 1445)  . feeding supplement (OSMOLITE 1.2 CAL) 1,000 mL (11/07/20 0000)    Principal Problem:   ICH (intracerebral hemorrhage) (HCC) Active Problems:   Intraparenchymal hemorrhage of brain (HCC)   Acute hypoxemic respiratory failure (HCC)   Fever   Endotracheal tube present   Status post tracheostomy ([place])   Palliative care by specialist   Muscle hypertonicity   Oropharyngeal dysphagia   Brain injury with loss of consciousness ([place])   Poor prognosis   Aspiration pneumonia of right lower lobe (HCC)   Gram-negative infection   Abnormal urinalysis   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   Acute cystitis without hematuria   Consultants:  Neurology no follow up specified. We'll call at the time of discharge.  Neurosurgery.  No follow up specified. We'll Call at the time of discharge.  Palliative care.  No follow up specified. Pt will benefit  from outpatient palliative medicine consult.  Inpatient rehab.  Not a candidate for CIR. "she'll have to demonstrate the ability to meaningfully participate in therapies and to show some carryover from day to day. If she goes home with family, extensive education will need to be provided."  If Provigil is ineffective,"can consider a trial of amantadine and ritalin"  PCCM continues to follow.  No decannulation.  Outpatient follow-up with trach clinic will be needed.   Procedures:  Tracheostomy  PEG tube  EEG  Echocardiogram  Antibiotics: Anti-infectives (From admission, onward)   Start     Dose/Rate Route Frequency Ordered Stop   10/27/20 0845  meropenem (MERREM) 1 g in sodium chloride 0.9 % 100 mL IVPB  Status:  Discontinued        1 g 200 mL/hr over 30 Minutes Intravenous Every 8 hours 10/27/20 0752 11/01/20 0720   10/26/20 1145  cefTRIAXone (ROCEPHIN) 1 g in sodium chloride 0.9 % 100 mL IVPB  Status:  Discontinued        1 g 200 mL/hr over 30 Minutes Intravenous Every 24 hours 10/26/20 1056 10/27/20 0752   10/14/20 1815  Ampicillin-Sulbactam (UNASYN) 3 g in sodium chloride 0.9 % 100 mL IVPB        3 g 200 mL/hr over 30 Minutes Intravenous Every 6 hours 10/14/20 1754 10/19/20 1340   10/08/20 0845  ciprofloxacin (CIPRO) tablet 500 mg  Status:  Discontinued        500 mg Per Tube 2 times daily 10/08/20 0754 10/14/20 1731   09/21/20 1545  ceFAZolin (ANCEF) IVPB 1 g/50 mL premix        1 g 100 mL/hr over 30 Minutes Intravenous Every 8 hours 09/21/20 1457 09/28/20 0647   09/20/20 0600  ceFAZolin (ANCEF) IVPB 1 g/50 mL premix  Status:  Discontinued        1 g 100 mL/hr over 30 Minutes Intravenous Every 8 hours 09/14/20 0903 09/14/20 0904   09/16/20 1300  vancomycin (VANCOREADY) IVPB 1250 mg/250 mL  Status:  Discontinued        1,250 mg 166.7 mL/hr over 90 Minutes Intravenous Every 24 hours 09/15/20 1232 09/17/20 0956   09/15/20 1230  vancomycin (VANCOREADY) IVPB 1250 mg/250 mL         1,250 mg 166.7 mL/hr over 90 Minutes Intravenous  Once 09/15/20 1131 09/15/20 1402   09/13/20 1400  ceFEPIme (MAXIPIME) 2 g in sodium chloride 0.9 % 100 mL IVPB  2 g 200 mL/hr over 30 Minutes Intravenous Every 8 hours 09/13/20 1341 09/19/20 2208   09/06/20 0600  vancomycin (VANCOREADY) IVPB 1250 mg/250 mL        1,250 mg 166.7 mL/hr over 90 Minutes Intravenous  Once 09/06/20 0541 09/06/20 0739   09/05/20 1400  ceFAZolin (ANCEF) IVPB 1 g/50 mL premix  Status:  Discontinued        1 g 100 mL/hr over 30 Minutes Intravenous Every 8 hours 09/05/20 1120 09/13/20 1341   08/30/20 0600  vancomycin (VANCOREADY) IVPB 500 mg/100 mL  Status:  Discontinued        500 mg 100 mL/hr over 60 Minutes Intravenous Every 12 hours 08/29/20 1554 09/01/20 1115   08/29/20 1700  ceFEPIme (MAXIPIME) 2 g in sodium chloride 0.9 % 100 mL IVPB  Status:  Discontinued        2 g 200 mL/hr over 30 Minutes Intravenous Every 12 hours 08/29/20 1554 09/01/20 1115   08/29/20 1645  vancomycin (VANCOREADY) IVPB 1250 mg/250 mL        1,250 mg 166.7 mL/hr over 90 Minutes Intravenous  Once 08/29/20 1554 08/29/20 1820       Time spent: 25 minutes    [facility] 72  Days

## 2020-11-07 NOTE — Plan of Care (Signed)
  Problem: Health Behavior/Discharge Planning: Goal: Ability to manage health-related needs will improve Outcome: Progressing   Problem: Coping: Goal: Will identify appropriate support needs Outcome: Progressing   Problem: Education: Goal: Knowledge of patient specific risk factors addressed and post discharge goals established will improve Outcome: Progressing   Problem: Education: Goal: Knowledge of secondary prevention will improve Outcome: Not Applicable   Problem: Education: Goal: Knowledge of disease or condition will improve Outcome: Progressing

## 2020-11-08 LAB — GLUCOSE, CAPILLARY
Glucose-Capillary: 116 mg/dL — ABNORMAL HIGH (ref 70–99)
Glucose-Capillary: 123 mg/dL — ABNORMAL HIGH (ref 70–99)
Glucose-Capillary: 128 mg/dL — ABNORMAL HIGH (ref 70–99)
Glucose-Capillary: 134 mg/dL — ABNORMAL HIGH (ref 70–99)
Glucose-Capillary: 134 mg/dL — ABNORMAL HIGH (ref 70–99)
Glucose-Capillary: 141 mg/dL — ABNORMAL HIGH (ref 70–99)
Glucose-Capillary: 143 mg/dL — ABNORMAL HIGH (ref 70–99)

## 2020-11-08 NOTE — Progress Notes (Signed)
[facility] PROGRESS NOTE  Patient: Charlene [name] WYO:[id number]   PCP: Patient, [name] (Inactive) DOB: 19-Aug-1965   DOA: 08/26/2020   DOS: 11/08/2020    Subjective: No acute events.  Heart rate blood pressure much better.  RN report frequent urination.  Objective:  Vitals:   11/08/20 1531 11/08/20 1600  BP: 124/75   Pulse: 100 97  Resp: 16 18  Temp: 97.9 F (36.6 C)   SpO2: 100% 100%    S1-S2 present. Clear to auscultation  Assessment and plan: Continue chronic management plan per note from 4/23.  RN reported frequent urination and requested pure wick placement. Currently holding.  Do not think that the patient will benefit from external catheter placement.  Weight gain. Discussed with RN regarding rechecking patient's weight although currently the bed is not providing accurate reading. Will discuss with staff tomorrow regarding availability of a bed that can monitor her weight or when the patient works with therapy check her weight. This will guide [name] to reduce or increase fluid intake which will help [name] with frequent urination.  Scopolamine patch. Can discontinue starting 4/25 and monitor.   Author: Lynden Oxford, MD [facility] 11/08/2020 7:47 PM   If 7PM-7AM, please contact night-coverage at [email or web address]

## 2020-11-08 NOTE — Plan of Care (Signed)
  Problem: Health Behavior/Discharge Planning: Goal: Ability to manage health-related needs will improve Outcome: Progressing   Problem: Health Behavior/Discharge Planning: Goal: Ability to manage health-related needs will improve Outcome: Progressing   Problem: Coping: Goal: Will identify appropriate support needs Outcome: Progressing   Problem: Education: Goal: Knowledge of patient specific risk factors addressed and post discharge goals established will improve Outcome: Progressing   Problem: Education: Goal: Knowledge of disease or condition will improve Outcome: Progressing

## 2020-11-09 LAB — GLUCOSE, CAPILLARY
Glucose-Capillary: 138 mg/dL — ABNORMAL HIGH (ref 70–99)
Glucose-Capillary: 122 mg/dL — ABNORMAL HIGH (ref 70–99)
Glucose-Capillary: 131 mg/dL — ABNORMAL HIGH (ref 70–99)
Glucose-Capillary: 71 mg/dL (ref 70–99)
Glucose-Capillary: 116 mg/dL — ABNORMAL HIGH (ref 70–99)

## 2020-11-09 NOTE — Progress Notes (Signed)
Nutrition Follow-up  DOCUMENTATION CODES:  Not applicable  INTERVENTION:  Continue Tube feeding via PEG tube: Osmolite 1.2 at 55 ml/h (1320 ml per day) 45 ml ProSource TF daily  Provides 1624 kcal, 84 gm protein, 1070 ml free water daily  100 ml free water every 4 hours    NUTRITION DIAGNOSIS:  Inadequate oral intake related to inability to eat as evidenced by NPO status. -- ongoing  GOAL:  Patient will meet greater than or equal to 90% of their needs -- Met with TF  MONITOR:  TF tolerance,Vent status  REASON FOR ASSESSMENT:  Consult,Ventilator Enteral/tube feeding initiation and management  ASSESSMENT:  Pt with PMH of HTN with medication noncompliance admitted with hypertensive thalamic ICH.  2/10 s/p EVD placement and intubation for respiratory arrest 2/13 EVD removed 2/17 palliative care consult; family requesting tx to [facility]  2/24 s/p trach and PEG  3/1 tx to [facility] 3/11 trach changed to shiley #6 cuffless 4/08 pt w/ sepsis 2/2 UTI 4/18 28% ATC/5L  Per Pulmonology, pt continues to tolerate trach and is stable from pulmonary perspective. CM/SW attempting to obtain bed offers from facilities that accommodate trach/PEG.   Pt continues to receive TF via PEG. Current regimen: Osmolite 1.2 at 55 ml/h w/ 45 ml ProSource TF daily, 100ml free water flushes Q4H. Tolerating well per RN.   No UOP documented x24 hours   Pt with generalized mild pitting edema and non-pitting edema to RUE per RN assessment BUE and BLE per RN assessment.   Admit wt: 61.2 kg Current wt: 74 kg   Medications: SSI Q4H, protonix, senokot Labs reviewed. CBGs 138-122-131  Diet Order:   Diet Order            Diet NPO time specified  Diet effective now                 EDUCATION NEEDS:   No education needs have been identified at this time  Skin:  Skin Assessment: Reviewed RN Assessment  Last BM:  4/25 type 6  Height:   Ht Readings from Last 1 Encounters:  10/18/20 5' 5" (1.651  m)    Weight:   Wt Readings from Last 1 Encounters:  11/09/20 74 kg    Ideal Body Weight:  56.8 kg  BMI:  Body mass index is 27.15 kg/m.  Estimated Nutritional Needs:   Kcal:  1600-1800  Protein:  80-90 grams  Fluid:  >1.6L/d    Amanda Averett, MS, RD, LDN RD pager number and weekend/on-call pager number located in Amion.  

## 2020-11-09 NOTE — Progress Notes (Signed)
[facility] PROGRESS NOTE  Charlene [name] GYB:[id number] DOB: 12-Oct-1965 DOA: 08/26/2020 PCP: Patient, [name] (Inactive)    October 08, 2020     10/16/2020                        10/16/2020     10/18/2020                        10/20/2020    Status: Remains inpatient appropriate because:Altered mental status, Unsafe d/c plan, IV treatments appropriate due to intensity of illness or inability to take PO and Inpatient level of care appropriate due to severity of illness   Dispo: The patient is from: Home              Anticipated d/c is to: SNF vs brain injury recovery rehab-family does not wish to place patient in SNF.  If this is the only option they would like to consider taking the patient home although this may be a difficult undertaking given current requirement for trach and understandable fears about managing such complex patient in the home environment.  They have been updated as to why this patient currently is not a candidate for aggressive rehabilitative therapies like would be provided at a brain injury recovery rehab.               Patient currently is medically stable to d/c.   Difficult to place patient Yes   Level of care: Progressive  Code Status: Full Family Communication:  extensive conversation via telephone with [name] [phone]) at bedside on 4/1.; [name] [phone]) is her POA and legal contact.  4/21 husband at bedside DVT prophylaxis: enoxaparin (LOVENOX) injection 40 mg Start: 09/01/20 1600 SCD's Start: 08/27/20 0026   Vaccination status: Unknown  Micro Data:  2/9 COVID + Influenza negative 2/10 MRSA PCR negative 2/12 BCx 2 > no growth  2/19 BC 1/2 +Stap EPI, 1/2 no growth  10/07/2020 sputum positive for Serratia marcescens 4/8 Enterobacter cloaca and urine culture MDR sensitive only to imipenem and gentamicin   Antimicrobials:  Cefepime 2/12 > 2/15 Vanc 2/12 > 2/15 10/08/2020 ciprofloxacin>>3/30 Unasyn 3/30 >> 4/02 Rocephin  4/11 > 4/12 Meropenem 4/12>4/17   Foley catheter:  No-Purewick discontinued on 4/13  HPI: 55 yo F with 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  Subsequently on 2/10, she developed obtundation and respiratory arrest leading to intubation.  She has remained persistently obtunded, now has a trach and a PEG.  Hospital course complicated by persistent fever, Serratia pneumonia and now pre-orbital cellulitis  As of 3/25 with adjustments in hypertonicity medications patient seems to be improving.  She is also waking up more although limited interaction but both eyes are now open and she tracks with the left eye.  Multiple discussions with husband who has confirmed he has not delegated other family members to be POA and he is the primary legal contact for patient.  He has agreed to let patient's daughter take the lead on completing and following up on Medicaid and disability applications including having these applications sent to her address.   2/09 Admit for [place] 2/10 EVD placed, later intubated due to obtundation and respiratory arrest 2/11 MRI Brain, hypertonic saline d/c'd, EVD not working.  Husband did not consent to PICC  2/13 EVD removed 2/17 [place] discussion with family, PMT 2/24 Bedside trach and PEG 3/1 Transferred to  [facility]  Subjective: Awake.  Involuntarily drooling will secretions from mouth.  No choking or coughing observed.  Objective: Vitals:   11/09/20 0333 11/09/20 0608  BP: 106/66 126/74  Pulse: 88   Resp: 18   Temp: (P) 98.2 F (36.8 C)   SpO2: 100%     Intake/Output Summary (Last 24 hours) at 11/09/2020 0746 Last data filed at 11/09/2020 3794 Gross per 24 hour  Intake 1060 ml  Output --  Net 1060 ml   Filed Weights   10/30/20 0500 10/31/20 0500 11/09/20 0500  Weight: 76 kg 76 kg 74 kg    Exam:  Constitutional: Awake, calm, no acute distress Respiratory: #6.0 cuffless trach,  trach collar with FiO2 21% at 5L,O2 sats 97 to 99%.  Lungs are clear.  No increased work of breathing or coughing Cardiovascular: S1-S2, no peripheral edema.  Regular pulse.  No resting tachycardia. Abdomen:  PEG tube-LBM 4/24, soft nontender and tolerating tube feeding Neurologic: CN 2-12 intact except notable with left ocular disconjugate gaze with medial right downward gaze. Lower extremity foot splints in place.  Able to track with right eye. Patient has spontaneous purposeful movement that is gross motor in nature of right upper extremity. Left preferance torticollis and gen hypertonicity controlled.  Able to track with right eye.  Partial occlusion glasses in place as applied by OT on 4/22. Psych: Awake.  Inconsistent eye contact but will track as above.  Did not follow commands.   Assessment/Plan: Acute problems: Multiple acute strokes  Right [place] ICH with IVH  Obstructive Hydrocephalus/Acute on persistent metabolic Encephalopathy/brain injury related hypertonicity Presented with headache and left-sided weakness.   Found to have [place].  Admitted to ICU under neurology with neurosurgery consult. Next day mentation worsened requiring EVD placement and intubation. EVD removed after 3 days on 2/13.  -EEG x 2 suggestive of cortical dysfunction in right hemisphere likely 2/2 underlying bleed as well as severe diffuse encephalopathy, nonspecific etiology . -Continue baclofen 5 mg at 8 AM and 4 PM and 7.5 mg at bedtime.  LFTs remain stable with baseline subtle elevation without change since initiation of baclofen.  Plan repeat LFTs around May 4th -Continue Provigil 200 mg daily as recommended by PMR team -Add scheduled Senokot at HS given requirement for SMRs -OOB to chair daily and PROM every 4 hours per nursing staff -Had very successful OT session on 4/22.  Partial obstruction glasses placed to decrease diplopia with an immediate increase in ability to track left and right and to fixate more  consistently and follow commands.  OT has thus recommended increase in frequency of OT/PT sessions and has also requested SLP be reconsulted.  Neck Contracture/torticollis -Controlled -Continue baclofen as above -- continue PT/OT weekly per their recommendations given patient's inability to effectively participate at this juncture.  Nursing staff performing PROM activities.  Family also instructed and has been performing intermittently. PT eval 4/20         Pt was seen for ROM and extended stretches on BLE's with improvement in knee ext and reduced tone driven hip flexion.  Pt is limited by lack of AROM in LE's and continues to need repositioning and use of PRAFO boots to protect heels.  Pt is also positioned with knees extended to reduce her contractures with pillows.  Continue to work on [employer] but will recommend her to have a 1.5 hour limit due to her inability to ask for return to bed.      OT eval 4/22:  After  spasticity inhibitory techniques and with facilitation, pt was able to follow simple motor commands with Rt UE and moderate - maximal facilitation and assist to reach for a picture, touch her mouth, apply lotion to Lt UE and to legs.  She requires increased time to initiate movement, as well as assist to inhibit the spasticity and for motor planning deficits.  Occlusion lenses placed on her with nasal portion of Lt lens occluded to reduce possible diplopia.  Pt with immediate increase in ability to track Lt and Rt and to fixate more consistently.  Will continue to follow.     Hypertensive emergency/Malignant HTN -Home medications include Coreg and HCTZ which have not been reordered -Blood pressure is actually stable now on 10 mg Norvasc, Lopressor and hydralazine.   Acute hypoxemic respiratory failure secondary to CVA requiring mechanical ventilation and tracheostomy tube -Stable on trach collar and has been transitioned to a #6 cuffless trach -Has significant altered  mentation and inability to manage oral secretions at present time is not a safe candidate for decannulation.  Discussed this with family as major obstacle in discharging to home.  This will also decrease options for SNF placement. -Appreciate trach team assistance in management  -Scopolamine patch placed by PCCM on 2/27. Currently secretion does not appear to be a significant issue.  Pain likely related to underlying hypertonicity -Significantly improved -Morphine IV, oxycodone and Tylenol prn (watch elev LFTs) -Continue baclofen as above  Dysphagia/Nutrition Status: Nutrition Problem: Inadequate oral intake Etiology: inability to eat Signs/Symptoms: NPO status Interventions: Tube feeding consisting of Osmolite 1.2 Cal at 55 cc/h; continue Prosource liquid 45 cc daily, free water 100 cc every 4 hours per tube; will also continue to follow CBGs regularly while on tube feedings and provide SSI Estimated body mass index is 27.15 kg/m as calculated from the following:   Height as of this encounter: $RemoveBeforeD'5\' 5"'[id number]$  (1.651 m).   Weight as of this encounter: 74 kg. If patient eventually discharged to home to teach family how to bolus feed as opposed to utilizing continuous tube feeding infusions.     Other problems: Anasarca-resolved  -bilateral upper extremity edema, minimal LE edema - suspect related to prolonged hospitalization, hypoalbuminemia -upper extremity US -> age indeterminate SVT of right cephalic vein  ESBL positive Enterobacter UTI -> 100,000 colonies-sensitive to gentamicin imipenem, resistant to cefazolin, cefepime Septra and intermediate to Cipro, nitrofurantoin energetically. -Has completed 5 days of meropenem -DC Purewick during acute UTI -Contact isolation -started on Myrbetriq for suspected bladder spasm, change to Oxybutynin per tube   Elevated LFT's - last checked 04/04, Remain minimally elevated but stable without any obstructive pattern -Continue baclofen and  statin  Tachycardia/suboptimal BP -Resolved -Was given 1L of NS and vfree water increased fom 100 c q 4 hrs to 200 cc  -HR and BP improved  Bacterial Conjunctivitis /periorbital cellulitis - MRI 3/7 orbits with L periorbital cellulitis  -- CT head 3/9 with persistent subtotal opacification of paranasal sinuses and mastoid air cells, bubbly lucency at base of R maxillary sinus (likely odontogenic in origin) - CT maxillofacial 3/12 - improving cellulitis  - s/p erythromycin ointment - completed course of Ancef   Chronic pansinusitis/recurrent fevers 2/2 aspiration PNA;History of Serratia Pneumonia  Hospital Acquired Pneumonia:  -Tracheal aspirate from 2/25 and 3/1 with serratia - cefepime 2/27-3/5; repeat sputum cx 3/21 + serratia (likely colonization but w/ recurrent fever opted to treat) -Has completed antibiotics recently for bilateral orbital cellulitis -started on Cipro for tracheal aspirate positive for  Serratia marcescens   -Repeat sinus CT demonstrates progressive complete bilateral maxillary sinus opacification with unchanged complete left sphenoid sinus opacification and right sphenoid sinus fluid c/w chronic state  -CT Chest 3/30 with aspiration PNA-dtr updated at bedside by attending MD- Cipro changed to Unasyn IV-Unasyn now completed -no increased O2 needs   Anemia -Stable, follow  HLD On Lipitor.  Continue statin on discharge.     Data Reviewed: Basic Metabolic Panel: Recent Labs  Lab 11/07/20 0319  NA 137  K 3.6  CL 100  CO2 30  GLUCOSE 129*  BUN 18  CREATININE 0.55  CALCIUM 9.1   Liver Function Tests: Recent Labs  Lab 11/07/20 0319  AST 41  ALT 69*  ALKPHOS 140*  BILITOT 0.3  PROT 6.6  ALBUMIN 2.6*   No results for input(s): LIPASE, AMYLASE in the last 168 hours. No results for input(s): AMMONIA in the last 168 hours. CBC: Recent Labs  Lab 11/07/20 0319  WBC 8.1  HGB 9.7*  HCT 30.8*  MCV 82.1  PLT 407*   Cardiac Enzymes: No results  for input(s): CKTOTAL, CKMB, CKMBINDEX, TROPONINI in the last 168 hours. BNP (last 3 results) No results for input(s): BNP in the last 8760 hours.  ProBNP (last 3 results) No results for input(s): PROBNP in the last 8760 hours.  CBG: Recent Labs  Lab 11/08/20 1205 11/08/20 1629 11/08/20 1950 11/08/20 2347 11/09/20 0342  GLUCAP 143* 123* 128* 134* 138*    No results found for this or any previous visit (from the past 240 hour(s)).   Studies: No results found.  Scheduled Meds: . amLODipine  10 mg Per Tube Daily  . atorvastatin  40 mg Per Tube Daily  . baclofen  5 mg Per Tube 2 times per day  . baclofen  7.5 mg Per Tube Daily  . chlorhexidine gluconate (MEDLINE KIT)  15 mL Mouth Rinse BID  . Chlorhexidine Gluconate Cloth  6 each Topical Daily  . enoxaparin (LOVENOX) injection  40 mg Subcutaneous Q24H  . feeding supplement (PROSource TF)  45 mL Per Tube Daily  . free water  100 mL Per Tube Q4H  . hydrALAZINE  50 mg Per Tube Q8H  . insulin aspart  0-9 Units Subcutaneous Q4H  . mouth rinse  15 mL Mouth Rinse 5 X Daily  . metoprolol tartrate  100 mg Per Tube BID  . modafinil  200 mg Per Tube Daily  . oxybutynin  5 mg Per Tube TID  . pantoprazole sodium  40 mg Per Tube BID  . scopolamine  1 patch Transdermal Q72H  . sennosides  5 mL Per Tube QHS   Continuous Infusions: . sodium chloride Stopped (10/26/20 1445)  . feeding supplement (OSMOLITE 1.2 CAL) 1,000 mL (11/08/20 1435)    Principal Problem:   ICH (intracerebral hemorrhage) (HCC) Active Problems:   Intraparenchymal hemorrhage of brain (HCC)   Acute hypoxemic respiratory failure (HCC)   Fever   Endotracheal tube present   Status post tracheostomy ([place])   Palliative care by specialist   Muscle hypertonicity   Oropharyngeal dysphagia   Brain injury with loss of consciousness ([place])   Poor prognosis   Aspiration pneumonia of right lower lobe (HCC)   Gram-negative infection   Abnormal urinalysis   Infection due  to extended spectrum beta lactamase (ESBL) producing Enterobacteriaceae bacterium   Acute cystitis without hematuria   Consultants:  Neurology no follow up specified. We'll call at the time of discharge.  Neurosurgery.  No follow  up specified. We'll Call at the time of discharge.  Palliative care.  No follow up specified. Pt will benefit from outpatient palliative medicine consult.  Inpatient rehab.  Not a candidate for CIR. "she'll have to demonstrate the ability to meaningfully participate in therapies and to show some carryover from day to day. If she goes home with family, extensive education will need to be provided."  If Provigil is ineffective,"can consider a trial of amantadine and ritalin"  PCCM continues to follow.  No decannulation.  Outpatient follow-up with trach clinic will be needed.   Procedures:  Tracheostomy  PEG tube  EEG  Echocardiogram  Antibiotics: Anti-infectives (From admission, onward)   Start     Dose/Rate Route Frequency Ordered Stop   10/27/20 0845  meropenem (MERREM) 1 g in sodium chloride 0.9 % 100 mL IVPB  Status:  Discontinued        1 g 200 mL/hr over 30 Minutes Intravenous Every 8 hours 10/27/20 0752 11/01/20 0720   10/26/20 1145  cefTRIAXone (ROCEPHIN) 1 g in sodium chloride 0.9 % 100 mL IVPB  Status:  Discontinued        1 g 200 mL/hr over 30 Minutes Intravenous Every 24 hours 10/26/20 1056 10/27/20 0752   10/14/20 1815  Ampicillin-Sulbactam (UNASYN) 3 g in sodium chloride 0.9 % 100 mL IVPB        3 g 200 mL/hr over 30 Minutes Intravenous Every 6 hours 10/14/20 1754 10/19/20 1340   10/08/20 0845  ciprofloxacin (CIPRO) tablet 500 mg  Status:  Discontinued        500 mg Per Tube 2 times daily 10/08/20 0754 10/14/20 1731   09/21/20 1545  ceFAZolin (ANCEF) IVPB 1 g/50 mL premix        1 g 100 mL/hr over 30 Minutes Intravenous Every 8 hours 09/21/20 1457 09/28/20 0647   09/20/20 0600  ceFAZolin (ANCEF) IVPB 1 g/50 mL premix  Status:   Discontinued        1 g 100 mL/hr over 30 Minutes Intravenous Every 8 hours 09/14/20 0903 09/14/20 0904   09/16/20 1300  vancomycin (VANCOREADY) IVPB 1250 mg/250 mL  Status:  Discontinued        1,250 mg 166.7 mL/hr over 90 Minutes Intravenous Every 24 hours 09/15/20 1232 09/17/20 0956   09/15/20 1230  vancomycin (VANCOREADY) IVPB 1250 mg/250 mL        1,250 mg 166.7 mL/hr over 90 Minutes Intravenous  Once 09/15/20 1131 09/15/20 1402   09/13/20 1400  ceFEPIme (MAXIPIME) 2 g in sodium chloride 0.9 % 100 mL IVPB        2 g 200 mL/hr over 30 Minutes Intravenous Every 8 hours 09/13/20 1341 09/19/20 2208   09/06/20 0600  vancomycin (VANCOREADY) IVPB 1250 mg/250 mL        1,250 mg 166.7 mL/hr over 90 Minutes Intravenous  Once 09/06/20 0541 09/06/20 0739   09/05/20 1400  ceFAZolin (ANCEF) IVPB 1 g/50 mL premix  Status:  Discontinued        1 g 100 mL/hr over 30 Minutes Intravenous Every 8 hours 09/05/20 1120 09/13/20 1341   08/30/20 0600  vancomycin (VANCOREADY) IVPB 500 mg/100 mL  Status:  Discontinued        500 mg 100 mL/hr over 60 Minutes Intravenous Every 12 hours 08/29/20 1554 09/01/20 1115   08/29/20 1700  ceFEPIme (MAXIPIME) 2 g in sodium chloride 0.9 % 100 mL IVPB  Status:  Discontinued        2  g 200 mL/hr over 30 Minutes Intravenous Every 12 hours 08/29/20 1554 09/01/20 1115   08/29/20 1645  vancomycin (VANCOREADY) IVPB 1250 mg/250 mL        1,250 mg 166.7 mL/hr over 90 Minutes Intravenous  Once 08/29/20 1554 08/29/20 1820       Time spent: 25 minutes    Erin Hearing ANP  [facility] 7 am - 330 pm/M-F for direct patient care and secure chat Please refer to [facility] for contact info 74  Days

## 2020-11-09 NOTE — Progress Notes (Signed)
CSW spoke with Vincenza Hews of [facility] to request his assistance in obtaining bed offers from one of those facilities that can accommodate a trach, peg, and LOG.  Edwin Dada, MSW, LCSW Transitions of Care  Clinical Social Worker II [phone]

## 2020-11-09 NOTE — TOC Progression Note (Addendum)
Transition of Care [facility]) - Progression Note    Patient Details  Name: Charlene [name] MRN: [id number] Date of Birth: [birth year]  [facility] of [facility] CM/SW Contact  Janae Bridgeman, RN Phone Number: 11/09/2020, 9:51 AM  Clinical Narrative:    Case management called and left a secure voicemail with Mariel Craft, RNCM at [facility] to check on bed availability and waiting list at the facility for possible placement for the patient.  CM and MSW will continue to explore options for [facility] placement for the patient, since the family is unable to provide 24 supervision and care for the patient to return home for care, according to the patient's husband, [name].  11/09/2020 1258 - CM faxed the patient's clinicals to numerous SNF facilities in the area for possible admission beds.   Expected Discharge Plan: Home w Home Health Services Barriers to Discharge: Continued Medical Work up  Expected Discharge Plan and Services Expected Discharge Plan: Home w Home Health Services In-house Referral: Clinical Social Work Discharge Planning Services: CM Consult   Living arrangements for the past 2 months: Single Family Home                                       Social Determinants of Health (SDOH) Interventions    Readmission Risk Interventions Readmission Risk Prevention Plan 09/29/2020  Transportation Screening Complete  PCP or Specialist Appt within 3-5 Days Complete  HRI or Home Care Consult Complete  Social Work Consult for Recovery Care Planning/Counseling Complete  Palliative Care Screening Complete  Medication Review [employer]) Complete  Some recent data might be hidden

## 2020-11-09 NOTE — Progress Notes (Addendum)
Physical Therapy Treatment Patient Details Name: Charlene [name] MRN: [id number] DOB: 1965-09-19 Today's Date: 11/09/2020    History of Present Illness Pt is 55 y/o female presents to [facility] on 2/9 with L sided weakness and headaches. PMH includes anxiety, HTN. 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 s/p Right frontal ventricular catheter placement on 2/10, stopped functioning on 2/11.  MRI 2/11 shows right greater than left mesial temporal lobes / parahippocampal cortex, bilateral basal ganglia, and possibly the upper pons that are concerning for acute infarcts, most likely secondary to mass effect from the hemorrhage. ETT 2/10-present.  Pt with trach and PEG on 09/10/20. Hospital course complicated by persistent fever, Serratia pneumonia and now pre-orbital cellulitis.    PT Comments    Pt progressing slowly towards physical therapy goals.  Focus of session was EOB trunk stability activity, stretching cervical and trunk muscle groups in sitting, and assessing tone during standing attempt. Husband, [name] present throughout session and very helpful. Noted pt attempted to track with R eye only (occlusion glasses donned) towards midline, and was not able to maintain gaze. Nystagmus noted throughout session. Pt has been on PT caseload at 1x/week due to low functional level. Will trial an increase in frequency to 2x/week and alternate PT/OT days as able to maximize the therapy days per week. Will continue to follow and progress as able per POC.    Follow Up Recommendations  SNF     Equipment Recommendations  Hospital bed    Recommendations for Other Services Rehab consult     Precautions / Restrictions Precautions Precautions: Fall Precaution Comments: peg and trach Restrictions Weight Bearing Restrictions: No    Mobility  Bed Mobility Overal bed  mobility: Needs Assistance Bed Mobility: Supine to Sit;Sit to Sidelying;Rolling     Supine to sit: Total assist;+2 for physical assistance Sit to supine: Total assist;+2 for physical assistance   General bed mobility comments: helicopter technique with bed pad to transition to/from EOB.    Transfers Overall transfer level: Needs assistance Equipment used: 2 person hand held assist Transfers: Sit to/from Stand Sit to Stand: Total assist;+2 physical assistance         General transfer comment: Attempted sit to stand.  She was able to lift buttocks ~12" off bed with total A +2, but was unable to achieve full standing.  Ambulation/Gait             General Gait Details: non ambulatory   Stairs             Wheelchair Mobility    Modified Rankin (Stroke Patients Only) Modified Rankin (Stroke Patients Only) Pre-Morbid Rankin Score: No symptoms Modified Rankin: Severe disability     Balance Overall balance assessment: Needs assistance Sitting-balance support: No upper extremity supported;Feet supported Sitting balance-Leahy Scale: Poor Sitting balance - Comments: Pt able to maintain EOB sitting x ~7 mins with min A while working on rotation to the Rt and turning head to fixate and locate items on her Rt Postural control: Posterior lean     Standing balance comment: unable                            Cognition Arousal/Alertness: Awake/alert Behavior During Therapy: Flat affect Overall Cognitive Status: Impaired/Different from baseline Area of Impairment: Following commands;Problem solving  Following Commands: Follows one step commands inconsistently     Problem Solving: Slow processing;Decreased initiation;Requires verbal cues;Requires tactile cues General Comments: Pt not tracking this session despite multiple attempts. Pt wearing occlusion glasses and able to bring R eye towards midline but not maintain her gaze or  reach midline. Nystagmus noted throughout session. Minimal command following but seemed to be attempting to rock her trunk back and forth in order to facilitate some RLE knee extension.      Exercises Other Exercises Other Exercises: Pt sitting EOB and performed trunk rotation with head turn x2 on each side. Other Exercises: cervical extension to neutral stretch, and L cervical rotation stretch Other Exercises: Weight bearing through R and L elbow x2 each direction with faciliation at unilateral shoulder and contralateral hip for return to midline.    General Comments General comments (skin integrity, edema, etc.): Husband, [name] present throughout session.      Pertinent Vitals/Pain Pain Assessment: Faces Faces Pain Scale: Hurts a little bit Pain Location: occasional grimacing with PROM activity Pain Descriptors / Indicators: Discomfort;Spasm;Grimacing Pain Intervention(s): Limited activity within patient's tolerance;Monitored during session;Repositioned    Home Living                      Prior Function            PT Goals (current goals can now be found in the care plan section) Acute Rehab PT Goals Patient Stated Goal: Pt unable to state PT Goal Formulation: Patient unable to participate in goal setting Time For Goal Achievement: 11/17/20 Potential to Achieve Goals: Poor Progress towards PT goals: Progressing toward goals    Frequency    Min 2X/week      PT Plan Current plan remains appropriate;Frequency needs to be updated    Co-evaluation              AM-PAC PT "6 Clicks" Mobility   Outcome Measure  Help needed turning from your back to your side while in a flat bed without using bedrails?: Total Help needed moving from lying on your back to sitting on the side of a flat bed without using bedrails?: Total Help needed moving to and from a bed to a chair (including a wheelchair)?: Total Help needed standing up from a chair using your arms (e.g.,  wheelchair or bedside chair)?: Total Help needed to walk in hospital room?: Total Help needed climbing 3-5 steps with a railing? : Total 6 Click Score: 6    End of Session Equipment Utilized During Treatment: Gait belt Activity Tolerance: Patient tolerated treatment well Patient left: in bed;with call bell/phone within reach;with bed alarm set;with family/visitor present Nurse Communication: Mobility status;Need for lift equipment PT Visit Diagnosis: Hemiplegia and hemiparesis;Other symptoms and signs involving the nervous system (R29.898);Other abnormalities of gait and mobility (R26.89) Hemiplegia - Right/Left: Right Hemiplegia - dominant/non-dominant: Dominant Hemiplegia - caused by: Cerebral infarction;Nontraumatic intracerebral hemorrhage;Other Nontraumatic intracranial hemorrhage     Time: 1410-1502 PT Time Calculation (min) (ACUTE ONLY): 52 min  Charges:  $Therapeutic Activity: 8-22 mins $Neuromuscular Re-education: 23-37 mins                     Conni Slipper, PT, DPT Acute Rehabilitation Services Pager: [phone] Office: [phone]    Marylynn Pearson 11/09/2020, 3:38 PM

## 2020-11-09 NOTE — Progress Notes (Signed)
NAME:  Charlene [name], MRN:  [id number], DOB:  September 01, 1965, LOS: 74 ADMISSION DATE:  08/26/2020, CONSULTATION DATE:  2/10 REFERRING MD:  Dr. Roda Shutters, CHIEF COMPLAINT:  ICH   Brief History:  55 year old female admitted with hypertensive R thalamic/basal ganglia ICH. Resultant obstructive hydrocephalus prompting EVD placement 2/10. Subsequently, 2/10 she had acute mental status change to obtundation and respiratory arrest resulting in intubation. Trach and Peg 2/24. Trach change to #6 cuffless on 3/11.   Past Medical History:  HTN, medication noncompliance  Significant Hospital Events:  2/09 Admit for ICH 2/10 EVD placed, later intubated due to obtundation and respiratory arrest 2/11 MRI Brain, hypertonic saline d/c'd, EVD not working.  Husband did not consent to PICC  2/13 EVD removed 2/17 GOC discussion with family, PMT 3/11 trach changed to Shiley 6 uncuffed 4/18 28% ATC / 5L   Consults:  Stroke PCCM Neurosurgery  Procedures:  ETT 2/10 >> 09/10/2020 EVD 2/10 > non functioning 2/11 at 1500; clamped 2/12 am > 2/13 Trach (Dr. Denese Killings) 2/24 >>  PEG placement (Dr. Sheliah Hatch) 2/24 >>   Significant Diagnostic Tests:  CT Head 2/9 >> Acute hemorrhage with the epicenter in the right thalamus. Measurement is approximately 3.2 x 2.1 x 2.5 cm (volume 8.8 cm^3). Intraventricular penetration with blood filling the third and fourth ventricles and nearly filling the right lateral ventricle. No hydrocephalus. CTA Head/Neck 2/10 >> Unchanged size of intraparenchymal hemorrhage centered in the right thalamus and basal ganglia with extension into the right lateral ventricle. 2. No intracranial arterial occlusion or high-grade stenosis. No dissection, aneurysm or hemodynamically significant stenosis of the carotid or vertebral arteries. CT Head 2/10 >> Stable parenchymal hemorrhage in the right thalamus and basal ganglia with intraventricular extension as described. No new focal abnormality is noted. CT  Head 2/10 (post intubation) >> redemonstrated intraparenchymal hemorrhage centered within the R thalamus/basal ganglia extending into the cerebral peduncle/midbrain with associated intraventricular extension and hemorrhage in the R lateral ventricle and fourth ventricle, overall not significantly changed from prior; stable mass effect, 75mm of R to L midline shift, postsurgical changes of ventriculostomy TTE 2/10 >> LVEF 60 to 65%, LV has normal function, no regional wall motion abnormalities, mild concentric LVH.  RV systolic function is normal, normal pulmonary artery systolic pressure.  MRA head 2/11 >>Negative intracranial MR angiography. No evidence of aneurysm or high flow vascular malformation to explain the intraparenchymal hemorrhage  MRI brain 2/11 >> Similar intraparenchymal hemorrhage centered within the right thalamus and basal ganglia extending into the right midbrain with intraventricular extension, effacement of the prepontine cistern and similar 4 mm of leftward midline shift; separate areas of restricted diffusion and edema involving the R > L mesial temporal lobes/parahippocampal cortex, bilateral basal ganglia, and possibly the upper pons that are c/f acute infarcts, most likely secondary to mass effect from hemorrhage. MRI brain 2/19 >> intraventricular clot has decreased in size but mid-brain parenchymal clot has not and there are more ischemic infarcts in the surrounding brain.  CT Head WO Contrast 09/07/20>> Expected evolution of intracranial hemorrhage since 08/30/2020 head CT. Multiple areas of low attenuation corresponding to some of the infarcts seen on prior MRI. Ventricle caliber similar to prior MRI   Micro Data:  2/9 COVID + Influenza negative 2/10 MRSA PCR negative 2/12 BCx 2 > no growth  2/19 BC 1/2 +Stap EPI, 1/2 no growth  10/07/2020 sputum positive for Serratia marcescens 4/8 UC >> enterobacter cloacae >> S-gent, imipenem. Otherwise resistant.   Antimicrobials:   Cefepime  2/12 > 2/15 Vanc 2/12 > 2/15 Ciprofloxacin 3/24 >> 3/30   Interim History / Subjective:  No overnight issues. TC 21%  Objective   Blood pressure (!) 103/58, pulse 88, temperature (P) 98.2 F (36.8 C), temperature source (P) Oral, resp. rate 16, height 5\' 5"  (1.651 m), weight 74 kg, last menstrual period 03/03/2014, SpO2 99 %, unknown if currently breastfeeding.    FiO2 (%):  [21 %] 21 %   Intake/Output Summary (Last 24 hours) at 11/09/2020 1042 Last data filed at 11/09/2020 11/11/2020 Gross per 24 hour  Intake 1060 ml  Output --  Net 1060 ml   Filed Weights   10/30/20 0500 10/31/20 0500 11/09/20 0500  Weight: 76 kg 76 kg 74 kg   Exam:  General: chronically ill appearing not responsive HEENT: MM pink/moist, #6 trach flexible shiley cuffless midline c/d/i Neuro: lying in bed eyes open, doesn't respond to verbal stimuli CV: s1s2 RRR, no m/r/g PULM:  Non-labored at rest, lungs bilaterally diminished but clear   Resolved Hospital Problem list   Hypernatremia  Obstructive hydrocephalus (resolved) Hypertensive emergency Acute hypoxemic respiratory failure secondary to CVA Serratia PNA  Bacterial Conjunctivitis   Assessment & Plan:   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 Goal sats over 90%.  -no decannulation due to mental status  -PCCM will continue follow Q week pending respiratory needs, please call sooner if new needs arise  -aspiration precautions  - agree with discontinuation of scopolamine.  - stable for discharge from pulmonary perspective.   6/8,  MD Pulmonary and Critical Care Medicine [facility]

## 2020-11-10 LAB — GLUCOSE, CAPILLARY
Glucose-Capillary: 122 mg/dL — ABNORMAL HIGH (ref 70–99)
Glucose-Capillary: 125 mg/dL — ABNORMAL HIGH (ref 70–99)
Glucose-Capillary: 135 mg/dL — ABNORMAL HIGH (ref 70–99)
Glucose-Capillary: 147 mg/dL — ABNORMAL HIGH (ref 70–99)
Glucose-Capillary: 149 mg/dL — ABNORMAL HIGH (ref 70–99)
Glucose-Capillary: 95 mg/dL (ref 70–99)

## 2020-11-10 NOTE — Progress Notes (Signed)
[facility] PROGRESS NOTE  Charlene [name] GYB:[id number] DOB: 12-Oct-1965 DOA: 08/26/2020 PCP: Patient, [name] (Inactive)    October 08, 2020     10/16/2020                        10/16/2020     10/18/2020                        10/20/2020    Status: Remains inpatient appropriate because:Altered mental status, Unsafe d/c plan, IV treatments appropriate due to intensity of illness or inability to take PO and Inpatient level of care appropriate due to severity of illness   Dispo: The patient is from: Home              Anticipated d/c is to: SNF vs brain injury recovery rehab-family does not wish to place patient in SNF.  If this is the only option they would like to consider taking the patient home although this may be a difficult undertaking given current requirement for trach and understandable fears about managing such complex patient in the home environment.  They have been updated as to why this patient currently is not a candidate for aggressive rehabilitative therapies like would be provided at a brain injury recovery rehab.               Patient currently is medically stable to d/c.   Difficult to place patient Yes   Level of care: Progressive  Code Status: Full Family Communication:  extensive conversation via telephone with [name] [phone]) at bedside on 4/1.; [name] [phone]) is her POA and legal contact.  4/21 husband at bedside DVT prophylaxis: enoxaparin (LOVENOX) injection 40 mg Start: 09/01/20 1600 SCD's Start: 08/27/20 0026   Vaccination status: Unknown  Micro Data:  2/9 COVID + Influenza negative 2/10 MRSA PCR negative 2/12 BCx 2 > no growth  2/19 BC 1/2 +Stap EPI, 1/2 no growth  10/07/2020 sputum positive for Serratia marcescens 4/8 Enterobacter cloaca and urine culture MDR sensitive only to imipenem and gentamicin   Antimicrobials:  Cefepime 2/12 > 2/15 Vanc 2/12 > 2/15 10/08/2020 ciprofloxacin>>3/30 Unasyn 3/30 >> 4/02 Rocephin  4/11 > 4/12 Meropenem 4/12>4/17   Foley catheter:  No-Purewick discontinued on 4/13  HPI: 55 yo F with 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  Subsequently on 2/10, she developed obtundation and respiratory arrest leading to intubation.  She has remained persistently obtunded, now has a trach and a PEG.  Hospital course complicated by persistent fever, Serratia pneumonia and now pre-orbital cellulitis  As of 3/25 with adjustments in hypertonicity medications patient seems to be improving.  She is also waking up more although limited interaction but both eyes are now open and she tracks with the left eye.  Multiple discussions with husband who has confirmed he has not delegated other family members to be POA and he is the primary legal contact for patient.  He has agreed to let patient's daughter take the lead on completing and following up on Medicaid and disability applications including having these applications sent to her address.   2/09 Admit for [place] 2/10 EVD placed, later intubated due to obtundation and respiratory arrest 2/11 MRI Brain, hypertonic saline d/c'd, EVD not working.  Husband did not consent to PICC  2/13 EVD removed 2/17 [place] discussion with family, PMT 2/24 Bedside trach and PEG 3/1 Transferred to  [facility]  Subjective: Awake and restless.  Pushing right arm down towards perineum.  Noted patient has saturated pad with urine.  Objective: Vitals:   11/10/20 0409 11/10/20 0729  BP: 137/87   Pulse: 95 96  Resp: (!) 22 (!) 25  Temp: 98.1 F (36.7 C)   SpO2: 100% 100%   No intake or output data in the 24 hours ending 11/10/20 0749 Filed Weights   10/30/20 0500 10/31/20 0500 11/09/20 0500  Weight: 76 kg 76 kg 74 kg    Exam:  Constitutional: Alert, mildly restless secondary to recent incontinence of large volume urine. Respiratory: #6.0 cuffless trach, trach collar with FiO2 21% at  5L,O2 sats 97 to 99%.  Anterior lungs are clear.  No increased work of breathing.  No tracheal secretions Cardiovascular: S1-S2, no tachycardia.  No peripheral edema Abdomen:  PEG tube-LBM 4/25, nontender.  Normoactive bowel sounds.  Tube feeding infusing Neurologic: CN 2-12 intact except notable with left ocular disconjugate gaze with medial right downward gaze. Lower extremity foot splints in place.  Able to track with right eye. Patient has spontaneous purposeful movement that is gross motor in nature of right upper extremity. Left preferance torticollis and gen hypertonicity controlled.  Continues to track with right eye.  Did have nystagmus when sitting edge of bed working with PT/OT on 4/20 Psych: Awake.  Is able to recognize family members that come into the room.  Nonverbal so unable to accurately assess orientation   Assessment/Plan: Acute problems: Multiple acute strokes  Right Thalamus ICH with IVH  Obstructive Hydrocephalus/Acute on persistent metabolic Encephalopathy/brain injury related hypertonicity Presented with headache and left-sided weakness.   Found to have [place].  Admitted to ICU under neurology with neurosurgery consult. Next day mentation worsened requiring EVD placement and intubation. EVD removed after 3 days on 2/13.  -EEG x 2 suggestive of cortical dysfunction in right hemisphere likely 2/2 underlying bleed as well as severe diffuse encephalopathy, nonspecific etiology . -Continue baclofen 5 mg at 8 AM and 4 PM and 7.5 mg at bedtime.  LFTs remain stable with baseline subtle elevation without change since initiation of baclofen.  Plan repeat LFTs around May 4th -Continue Provigil 200 mg daily as recommended by PMR team -Add scheduled Senokot at HS given requirement for SMRs -OOB to chair daily and PROM every 4 hours per nursing staff -Had very successful OT session on 4/22.  Partial obstruction glasses placed to decrease diplopia with an immediate increase in ability to  track left and right and to fixate more consistently and follow commands.  OT has thus recommended increase in frequency of OT/PT sessions and has also requested SLP be reconsulted. -4/25 patient did have nystagmus while sitting on edge of bed working with PT -this did not correct with placement of partial-occlusion glasses -Plan is to have PT and OT work with patient 2 times per week each on alternate days therefore patient would receive a total of 4 days/week of therapy  Neck Contracture/torticollis -Controlled -Continue baclofen as above -- continue PT/OT weekly per their recommendations given patient's inability to effectively participate at this juncture.  Nursing staff performing PROM activities.  Family also instructed and has been performing intermittently. PT eval 4/25       Pt progressing slowly towards physical therapy goals.  Focus of session was EOB trunk stability activity, stretching cervical and trunk muscle groups in sitting, and assessing tone during standing attempt. Husband, [name] present throughout session and very helpful. Noted pt attempted to track with  R eye only (occlusion glasses donned) towards midline, and was not able to maintain gaze. Nystagmus noted throughout session. Pt has been on PT caseload at 1x/week due to low functional level. Will trial an increase in frequency to 2x/week and alternate PT/OT days as able to maximize the therapy days per week. Will continue to follow and progress as able per POC.       OT eval 4/22:  After spasticity inhibitory techniques and with facilitation, pt was able to follow simple motor commands with Rt UE and moderate - maximal facilitation and assist to reach for a picture, touch her mouth, apply lotion to Lt UE and to legs.  She requires increased time to initiate movement, as well as assist to inhibit the spasticity and for motor planning deficits.  Occlusion lenses placed on her with nasal portion of Lt lens occluded to reduce  possible diplopia.  Pt with immediate increase in ability to track Lt and Rt and to fixate more consistently.  Will continue to follow.     Hypertensive emergency/Malignant HTN -Home medications include Coreg and HCTZ which have not been reordered -Blood pressure is actually stable now on 10 mg Norvasc, Lopressor and hydralazine.   Acute hypoxemic respiratory failure secondary to CVA requiring mechanical ventilation and tracheostomy tube -Stable on trach collar and has been transitioned to a #6 cuffless trach -Has significant altered mentation and inability to manage oral secretions at present time is not a safe candidate for decannulation.  Discussed this with family as major obstacle in discharging to home.  This will also decrease options for SNF placement. -Appreciate trach team assistance in management  -Scopolamine patch placed by PCCM on 2/27. Currently secretion does not appear to be a significant issue.  Dysphagia/Nutrition Status: Nutrition Problem: Inadequate oral intake Etiology: inability to eat Signs/Symptoms: NPO status Interventions: Tube feeding consisting of Osmolite 1.2 Cal at 55 cc/h; continue Prosource liquid 45 cc daily, free water 100 cc every 4 hours per tube; will also continue to follow CBGs regularly while on tube feedings and provide SSI Estimated body mass index is 27.15 kg/m as calculated from the following:   Height as of this encounter: $RemoveBeforeD'5\' 5"'[id number]$  (1.651 m).   Weight as of this encounter: 74 kg. If patient eventually discharged to home to teach family how to bolus feed as opposed to utilizing continuous tube feeding infusions.     Other problems: Anasarca-resolved  -bilateral upper extremity edema, minimal LE edema - suspect related to prolonged hospitalization, hypoalbuminemia -upper extremity US -> age indeterminate SVT of right cephalic vein  ESBL positive Enterobacter UTI -> 100,000 colonies-sensitive to gentamicin imipenem, resistant to cefazolin,  cefepime Septra and intermediate to Cipro, nitrofurantoin energetically. -Has completed 5 days of meropenem -DC Purewick during acute UTI -Contact isolation -started on Myrbetriq for suspected bladder spasm, change to Oxybutynin per tube   Elevated LFT's - last checked 04/04, Remain minimally elevated but stable without any obstructive pattern -Continue baclofen and statin  Tachycardia/suboptimal BP -Resolved -Was given 1L of NS and vfree water increased fom 100 c q 4 hrs to 200 cc  -HR and BP improved  Bacterial Conjunctivitis /periorbital cellulitis - MRI 3/7 orbits with L periorbital cellulitis  -- CT head 3/9 with persistent subtotal opacification of paranasal sinuses and mastoid air cells, bubbly lucency at base of R maxillary sinus (likely odontogenic in origin) - CT maxillofacial 3/12 - improving cellulitis  - s/p erythromycin ointment - completed course of Ancef   Chronic pansinusitis/recurrent fevers 2/2  aspiration PNA;History of Serratia Pneumonia  Hospital Acquired Pneumonia:  -Tracheal aspirate from 2/25 and 3/1 with serratia - cefepime 2/27-3/5; repeat sputum cx 3/21 + serratia (likely colonization but w/ recurrent fever opted to treat) -Has completed antibiotics recently for bilateral orbital cellulitis -started on Cipro for tracheal aspirate positive for Serratia marcescens   -Repeat sinus CT demonstrates progressive complete bilateral maxillary sinus opacification with unchanged complete left sphenoid sinus opacification and right sphenoid sinus fluid c/w chronic state  -CT Chest 3/30 with aspiration PNA-dtr updated at bedside by attending MD- Cipro changed to Unasyn IV-Unasyn now completed -no increased O2 needs   Anemia -Stable, follow  HLD On Lipitor.  Continue statin on discharge.  Pain likely related to underlying hypertonicity -Significantly appears resolved and not using oxycodone -Morphine IV, oxycodone and Tylenol prn (watch elev LFTs) -Continue  baclofen as above     Data Reviewed: Basic Metabolic Panel: Recent Labs  Lab 11/07/20 0319  NA 137  K 3.6  CL 100  CO2 30  GLUCOSE 129*  BUN 18  CREATININE 0.55  CALCIUM 9.1   Liver Function Tests: Recent Labs  Lab 11/07/20 0319  AST 41  ALT 69*  ALKPHOS 140*  BILITOT 0.3  PROT 6.6  ALBUMIN 2.6*   No results for input(s): LIPASE, AMYLASE in the last 168 hours. No results for input(s): AMMONIA in the last 168 hours. CBC: Recent Labs  Lab 11/07/20 0319  WBC 8.1  HGB 9.7*  HCT 30.8*  MCV 82.1  PLT 407*   Cardiac Enzymes: No results for input(s): CKTOTAL, CKMB, CKMBINDEX, TROPONINI in the last 168 hours. BNP (last 3 results) No results for input(s): BNP in the last 8760 hours.  ProBNP (last 3 results) No results for input(s): PROBNP in the last 8760 hours.  CBG: Recent Labs  Lab 11/09/20 1219 11/09/20 1533 11/09/20 2048 11/10/20 0007 11/10/20 0409  GLUCAP 131* 71 116* 95 125*    No results found for this or any previous visit (from the past 240 hour(s)).   Studies: No results found.  Scheduled Meds: . amLODipine  10 mg Per Tube Daily  . atorvastatin  40 mg Per Tube Daily  . baclofen  5 mg Per Tube 2 times per day  . baclofen  7.5 mg Per Tube Daily  . chlorhexidine gluconate (MEDLINE KIT)  15 mL Mouth Rinse BID  . Chlorhexidine Gluconate Cloth  6 each Topical Daily  . enoxaparin (LOVENOX) injection  40 mg Subcutaneous Q24H  . feeding supplement (PROSource TF)  45 mL Per Tube Daily  . free water  100 mL Per Tube Q4H  . hydrALAZINE  50 mg Per Tube Q8H  . insulin aspart  0-9 Units Subcutaneous Q4H  . mouth rinse  15 mL Mouth Rinse 5 X Daily  . metoprolol tartrate  100 mg Per Tube BID  . modafinil  200 mg Per Tube Daily  . oxybutynin  5 mg Per Tube TID  . pantoprazole sodium  40 mg Per Tube BID  . sennosides  5 mL Per Tube QHS   Continuous Infusions: . sodium chloride Stopped (10/26/20 1445)  . feeding supplement (OSMOLITE 1.2 CAL) 1,000  mL (11/08/20 1435)    Principal Problem:   ICH (intracerebral hemorrhage) (HCC) Active Problems:   Intraparenchymal hemorrhage of brain (HCC)   Acute hypoxemic respiratory failure (HCC)   Fever   Endotracheal tube present   Status post tracheostomy [facility])   Palliative care by specialist   Muscle hypertonicity   Oropharyngeal  dysphagia   Brain injury with loss of consciousness ([place])   Poor prognosis   Aspiration pneumonia of right lower lobe (HCC)   Gram-negative infection   Abnormal urinalysis   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   Acute cystitis without hematuria   Consultants:  Neurology no follow up specified. We'll call at the time of discharge.  Neurosurgery.  No follow up specified. We'll Call at the time of discharge.  Palliative care.  No follow up specified. Pt will benefit from outpatient palliative medicine consult.  Inpatient rehab.  Not a candidate for CIR. "she'll have to demonstrate the ability to meaningfully participate in therapies and to show some carryover from day to day. If she goes home with family, extensive education will need to be provided."  If Provigil is ineffective,"can consider a trial of amantadine and ritalin"  PCCM continues to follow.  No decannulation.  Outpatient follow-up with trach clinic will be needed.   Procedures:  Tracheostomy  PEG tube  EEG  Echocardiogram  Antibiotics: Anti-infectives (From admission, onward)   Start     Dose/Rate Route Frequency Ordered Stop   10/27/20 0845  meropenem (MERREM) 1 g in sodium chloride 0.9 % 100 mL IVPB  Status:  Discontinued        1 g 200 mL/hr over 30 Minutes Intravenous Every 8 hours 10/27/20 0752 11/01/20 0720   10/26/20 1145  cefTRIAXone (ROCEPHIN) 1 g in sodium chloride 0.9 % 100 mL IVPB  Status:  Discontinued        1 g 200 mL/hr over 30 Minutes Intravenous Every 24 hours 10/26/20 1056 10/27/20 0752   10/14/20 1815   Ampicillin-Sulbactam (UNASYN) 3 g in sodium chloride 0.9 % 100 mL IVPB        3 g 200 mL/hr over 30 Minutes Intravenous Every 6 hours 10/14/20 1754 10/19/20 1340   10/08/20 0845  ciprofloxacin (CIPRO) tablet 500 mg  Status:  Discontinued        500 mg Per Tube 2 times daily 10/08/20 0754 10/14/20 1731   09/21/20 1545  ceFAZolin (ANCEF) IVPB 1 g/50 mL premix        1 g 100 mL/hr over 30 Minutes Intravenous Every 8 hours 09/21/20 1457 09/28/20 0647   09/20/20 0600  ceFAZolin (ANCEF) IVPB 1 g/50 mL premix  Status:  Discontinued        1 g 100 mL/hr over 30 Minutes Intravenous Every 8 hours 09/14/20 0903 09/14/20 0904   09/16/20 1300  vancomycin (VANCOREADY) IVPB 1250 mg/250 mL  Status:  Discontinued        1,250 mg 166.7 mL/hr over 90 Minutes Intravenous Every 24 hours 09/15/20 1232 09/17/20 0956   09/15/20 1230  vancomycin (VANCOREADY) IVPB 1250 mg/250 mL        1,250 mg 166.7 mL/hr over 90 Minutes Intravenous  Once 09/15/20 1131 09/15/20 1402   09/13/20 1400  ceFEPIme (MAXIPIME) 2 g in sodium chloride 0.9 % 100 mL IVPB        2 g 200 mL/hr over 30 Minutes Intravenous Every 8 hours 09/13/20 1341 09/19/20 2208   09/06/20 0600  vancomycin (VANCOREADY) IVPB 1250 mg/250 mL        1,250 mg 166.7 mL/hr over 90 Minutes Intravenous  Once 09/06/20 0541 09/06/20 0739   09/05/20 1400  ceFAZolin (ANCEF) IVPB 1 g/50 mL premix  Status:  Discontinued        1 g 100 mL/hr over 30 Minutes Intravenous Every 8  hours 09/05/20 1120 09/13/20 1341   08/30/20 0600  vancomycin (VANCOREADY) IVPB 500 mg/100 mL  Status:  Discontinued        500 mg 100 mL/hr over 60 Minutes Intravenous Every 12 hours 08/29/20 1554 09/01/20 1115   08/29/20 1700  ceFEPIme (MAXIPIME) 2 g in sodium chloride 0.9 % 100 mL IVPB  Status:  Discontinued        2 g 200 mL/hr over 30 Minutes Intravenous Every 12 hours 08/29/20 1554 09/01/20 1115   08/29/20 1645  vancomycin (VANCOREADY) IVPB 1250 mg/250 mL        1,250 mg 166.7 mL/hr over 90  Minutes Intravenous  Once 08/29/20 1554 08/29/20 1820       Time spent: 25 minutes    Erin Hearing ANP  [facility] 7 am - 330 pm/M-F for direct patient care and secure chat Please refer to [place] for contact info 75  Days

## 2020-11-10 NOTE — Evaluation (Signed)
Passy-Muir Speaking Valve - Evaluation Patient Details  Name: EZRI FANGUY MRN: [id number] Date of Birth: May 23, 1966  Today's Date: 11/10/2020 Time: 1415-1430 SLP Time Calculation (min) (ACUTE ONLY): 15 min  Past Medical History:  Past Medical History:  Diagnosis Date  . Hypertension    Past Surgical History:  Past Surgical History:  Procedure Laterality Date  . CESAREAN SECTION    . ESOPHAGOGASTRODUODENOSCOPY N/A 09/10/2020   Procedure: ESOPHAGOGASTRODUODENOSCOPY (EGD);  Surgeon: Sheliah Hatch, De Blanch, MD;  Location: [facility] ENDOSCOPY;  Service: General;  Laterality: N/A;  . PEG PLACEMENT N/A 09/10/2020   Procedure: PERCUTANEOUS ENDOSCOPIC GASTROSTOMY (PEG) PLACEMENT;  Surgeon: Sheliah Hatch De Blanch, MD;  Location: [facility] ENDOSCOPY;  Service: General;  Laterality: N/A;   HPI:  Pt is 55 y/o female presents to [facility] on 2/9 with L sided weakness and headaches. PMH includes anxiety, HTN. 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 s/p Right frontal ventricular catheter placement on 2/10, stopped functioning on 2/11.  MRI 2/11 shows right greater than left mesial temporal lobes / parahippocampal cortex, bilateral basal ganglia, and possibly the upper pons that are concerning for acute infarcts, most likely secondary to mass effect from the hemorrhage. ETT 2/10; trach/PEG 09/10/20.  Trach changed to #6 cuffless 3/11. Hospital course complicated by persistent fever,  Serratia pneumonia and pre-orbital cellulitis.   Assessment / Plan / Recommendation Clinical Impression  SLP consulted per OT request as Ms. Najarian has begun to show more ability to follow commands and participate in therapy, albeit at fundamental levels.  Passy Muir valve assessment was completed - pt now with a #6 cuffless trach.  VS at baseline were HR mid80s, Sp02 92-199%, RR 18-22.  Pt was  repositioned to optimize participation; oral cavity was suctioned. PMV placed for intermittent intervals of 3-5 breath cycles to assess for upper airway patency.  Removal of valve revealed no backflow of air through trach. Pt achieved immediate, spontaneous, nonpurposeful voicing.  She tolerated valve for ten minutes without difficulty and with no changes in VS.  She tracked examiner and fixated gaze.  She demonstrated spontaneous swallowing response throughout session.  Recommend ongoing SLP for treatment with valve, initiate speech/language and bedside swallowing assessments. Pt may use valve with OT/PT during their therapy sessions. SLP Visit Diagnosis: Aphonia (R49.1)    SLP Assessment  Patient needs continued Speech Lanaguage Pathology Services    Follow Up Recommendations       Frequency and Duration min 3x week  2 weeks    PMSV Trial PMSV was placed for: 10 Able to redirect subglottic air through upper airway: Yes Able to Attain Phonation: Yes Voice Quality: Low vocal intensity Able to Expectorate Secretions: No attempts Level of Secretion Expectoration with PMSV: Not observed Breath Support for Phonation: Severely decreased Intelligibility: Unable to assess (comment) Respirations During Trial: 18 SpO2 During Trial: 95 % Pulse During Trial: 85 Behavior: Alert   Tracheostomy Tube  Additional Tracheostomy Tube Assessment Fenestrated: No    Vent Dependency  Vent Dependent: No FiO2 (%): 21 %    Cuff Deflation Trial  GO Tolerated Cuff Deflation:  (n/a)        Kammy Klett [name] 11/10/2020, 2:49 PM Chapin Arduini L. Samson [name], MA CCC/SLP Acute Rehabilitation Services Office number [phone] Pager [phone]

## 2020-11-10 NOTE — TOC Progression Note (Addendum)
Transition of Care [facility]) - Progression Note    Patient Details  Name: Charlene [name] MRN: [id number] Date of Birth: January 11, 1966  [facility]) CM/SW Contact  Curlene Labrum, RN Phone Number: 11/10/2020, 11:53 AM  Clinical Narrative:    Case management met with the patient and patient's mother at the bedside regarding transitions of care.  The patient's grandmother is supportive of the patient and asked questions in regards to patient's progress neurologically and care in regards to infections prevention and skin care needs.  Erin Hearing, NP updated that the patient's mother regarding this matter.  CM and MSW continues to explore admission opportunities for the patient to SNF facilities.  The patient does not have any current bed offers at this time and remains on the waiting list for [facility] SNF for charity admission - but no beds available at this time.  The patient's family would like the patient placed close to [facility] if possible and husband understands that this may not be possible considering the patient's care needs and pending Medicaid insurance.  [name] states in a previous conversation with myself and [name], MSW that he believes that the family is unable to provide 24 hour supervision at home and that discharge to home is not a reasonable discharge plan at this time.  11/10/2020 1236 - CM called and left a message with Jamse Arn, CM with [facility] to check on availability of open beds at the facility for admission.  MSW and CM will continue to search for SNF facilities for admission - no bed offers at this time.   Expected Discharge Plan: [place] Barriers to Discharge: Continued Medical Work up  Expected Discharge Plan and Services Expected Discharge Plan: Holiday In-house Referral: Clinical Social Work Discharge Planning Services: CM Consult   Living arrangements  for the past 2 months: Single Family Home                                       Social Determinants of Health (SDOH) Interventions    Readmission Risk Interventions Readmission Risk Prevention Plan 09/29/2020  Transportation Screening Complete  PCP or Specialist Appt within 3-5 Days Complete  HRI or [place] Complete  Social Work Consult for [place] Planning/Counseling Complete  Palliative Care Screening Complete  Medication Review [employer]) Complete  Some recent data might be hidden

## 2020-11-11 LAB — GLUCOSE, CAPILLARY
Glucose-Capillary: 110 mg/dL — ABNORMAL HIGH (ref 70–99)
Glucose-Capillary: 130 mg/dL — ABNORMAL HIGH (ref 70–99)
Glucose-Capillary: 138 mg/dL — ABNORMAL HIGH (ref 70–99)
Glucose-Capillary: 147 mg/dL — ABNORMAL HIGH (ref 70–99)
Glucose-Capillary: 151 mg/dL — ABNORMAL HIGH (ref 70–99)
Glucose-Capillary: 155 mg/dL — ABNORMAL HIGH (ref 70–99)
Glucose-Capillary: 90 mg/dL (ref 70–99)

## 2020-11-11 NOTE — Progress Notes (Addendum)
CSW spoke with Vincenza Hews of [facility] who states the corporate regional RN is coming to visit the patient tomorrow for an on site visit.   CSW attempted to reach patient's husband [name] to inform him of scheduled visit, however no answer so a voicemail was left with details.  Edwin Dada, MSW, LCSW Transitions of Care  Clinical Social Worker II [phone]

## 2020-11-11 NOTE — Progress Notes (Signed)
Occupational Therapy Treatment Patient Details Name: Charlene [name] MRN: [id number] DOB: 12-11-1965 Today's Date: 11/11/2020    History of present illness Pt is 55 y/o female presents to [facility] on 2/9 with L sided weakness and headaches. PMH includes anxiety, HTN. 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 s/p Right frontal ventricular catheter placement on 2/10, stopped functioning on 2/11.  MRI 2/11 shows right greater than left mesial temporal lobes / parahippocampal cortex, bilateral basal ganglia, and possibly the upper pons that are concerning for acute infarcts, most likely secondary to mass effect from the hemorrhage. ETT 2/10-present.  Pt with trach and PEG on 09/10/20. Hospital course complicated by persistent fever, Serratia pneumonia and now pre-orbital cellulitis.   OT comments  Pt consistently visually fixated and tracked this date.  She followed one simple command with assistance, but was noticeably fatigued this date and demonstrated less motor output today.  Mother and sister were instructed in activities they could do with pt to stimulate her and increase her engagement.  Will continue to follow.   Follow Up Recommendations  SNF;Supervision/Assistance - 24 hour    Equipment Recommendations  None recommended by OT    Recommendations for Other Services      Precautions / Restrictions Precautions Precautions: Fall Precaution Comments: peg and trach       Mobility Bed Mobility     Rolling: Max assist         General bed mobility comments: facilitation to rotate head and flex neck, and facilitation at scapula and hip    Transfers                      Balance                                           ADL either performed or assessed with clinical judgement   ADL                                          General ADL Comments: pt requires total A     Vision       Perception     Praxis      Cognition Arousal/Alertness: Awake/alert;Lethargic Behavior During Therapy: Flat affect Overall Cognitive Status: Impaired/Different from baseline Area of Impairment: Attention;Following commands                   Current Attention Level: Focused   Following Commands: Follows one step commands inconsistently       General Comments: Pt fixated and tracked consistently this session.  She followed one simple one step command with mod A, the was noticably fatigued and had difficulty attending to task        Exercises Other Exercises Other Exercises: spoke with mother and sister re: methods to stimulate pt.  Encouraged use of music, children's voices, simple grooming tasks with hand over hand assistance.  PROM   Shoulder Instructions       General Comments      Pertinent Vitals/ Pain       Pain Assessment: Faces Faces Pain Scale: No hurt  Home Living  Prior Functioning/Environment              Frequency  Min 2X/week        Progress Toward Goals  OT Goals(current goals can now be found in the care plan section)  Progress towards OT goals: Progressing toward goals (slow, but consistent progress)     Plan Discharge plan remains appropriate    Co-evaluation                 AM-PAC OT "6 Clicks" Daily Activity     Outcome Measure   Help from another person eating meals?: Total Help from another person taking care of personal grooming?: Total Help from another person toileting, which includes using toliet, bedpan, or urinal?: Total Help from another person bathing (including washing, rinsing, drying)?: Total Help from another person to put on and taking off regular upper body clothing?: Total Help from another person to put on and taking off regular lower body clothing?:  Total 6 Click Score: 6    End of Session Equipment Utilized During Treatment: Oxygen  OT Visit Diagnosis: Muscle weakness (generalized) (M62.81);Apraxia (R48.2);Cognitive communication deficit (R41.841);Hemiplegia and hemiparesis Symptoms and signs involving cognitive functions: Other Nontraumatic ICH Hemiplegia - caused by: Other Nontraumatic intracranial hemorrhage   Activity Tolerance Patient limited by fatigue   Patient Left in bed;with call bell/phone within reach;with family/visitor present   Nurse Communication Mobility status        Time: 2992-4268 OT Time Calculation (min): 50 min  Charges: OT General Charges $OT Visit: 1 Visit OT Treatments $Therapeutic Activity: 8-22 mins $Neuromuscular Re-education: 23-37 mins  Eber Jones OTR/L Acute Rehabilitation Services Pager [phone] Office [phone]    Jeani Hawking M 11/11/2020, 4:52 PM

## 2020-11-11 NOTE — Progress Notes (Signed)
[facility] PROGRESS NOTE  Charlene [name] GYB:[id number] DOB: 12-Oct-1965 DOA: 08/26/2020 PCP: Patient, [name] (Inactive)    October 08, 2020     10/16/2020                        10/16/2020     10/18/2020                        10/20/2020    Status: Remains inpatient appropriate because:Altered mental status, Unsafe d/c plan, IV treatments appropriate due to intensity of illness or inability to take PO and Inpatient level of care appropriate due to severity of illness   Dispo: The patient is from: Home              Anticipated d/c is to: SNF vs brain injury recovery rehab-family does not wish to place patient in SNF.  If this is the only option they would like to consider taking the patient home although this may be a difficult undertaking given current requirement for trach and understandable fears about managing such complex patient in the home environment.  They have been updated as to why this patient currently is not a candidate for aggressive rehabilitative therapies like would be provided at a brain injury recovery rehab.               Patient currently is medically stable to d/c.   Difficult to place patient Yes   Level of care: Progressive  Code Status: Full Family Communication:  extensive conversation via telephone with daughter Charlene [name] [phone]) at bedside on 4/1.; husband Charlene [name] [phone]) is her POA and legal contact.  4/21 husband at bedside DVT prophylaxis: enoxaparin (LOVENOX) injection 40 mg Start: 09/01/20 1600 SCD's Start: 08/27/20 0026   Vaccination status: Unknown  Micro Data:  2/9 COVID + Influenza negative 2/10 MRSA PCR negative 2/12 BCx 2 > no growth  2/19 BC 1/2 +Stap EPI, 1/2 no growth  10/07/2020 sputum positive for Serratia marcescens 4/8 Enterobacter cloaca and urine culture MDR sensitive only to imipenem and gentamicin   Antimicrobials:  Cefepime 2/12 > 2/15 Vanc 2/12 > 2/15 10/08/2020 ciprofloxacin>>3/30 Unasyn 3/30 >> 4/02 Rocephin  4/11 > 4/12 Meropenem 4/12>4/17   Foley catheter:  No-Purewick discontinued on 4/13  HPI: 55 yo F with 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  Subsequently on 2/10, she developed obtundation and respiratory arrest leading to intubation.  She has remained persistently obtunded, now has a trach and a PEG.  Hospital course complicated by persistent fever, Serratia pneumonia and now pre-orbital cellulitis  As of 3/25 with adjustments in hypertonicity medications patient seems to be improving.  She is also waking up more although limited interaction but both eyes are now open and she tracks with the left eye.  Multiple discussions with husband who has confirmed he has not delegated other family members to be POA and he is the primary legal contact for patient.  He has agreed to let patient's daughter take the lead on completing and following up on Medicaid and disability applications including having these applications sent to her address.   2/09 Admit for [place] 2/10 EVD placed, later intubated due to obtundation and respiratory arrest 2/11 MRI Brain, hypertonic saline d/c'd, EVD not working.  Husband did not consent to PICC  2/13 EVD removed 2/17 [place] discussion with family, PMT 2/24 Bedside trach and PEG 3/1 Transferred to  [facility]  Subjective: Very alert.  Sitting up in bed.  Tracking both myself and her mother at bedside.  [name] was describing her recent diagnosis of elevated blood pressure and increased stress levels due to illness of daughter.  She reported reluctance to begin blood pressure medications preferring to wait until her stress level is decreased.  As I was discussing my concerns with her the patient was noted to be frowning.  Objective: Vitals:   11/11/20 0007 11/11/20 0416  BP: 138/87 140/88  Pulse: 95 95  Resp: 20 (!) 21  Temp: 99.8 F (37.7 C) 100 F (37.8 C)  SpO2: 100% 100%    No intake or output data in the 24 hours ending 11/11/20 0758 Filed Weights   10/30/20 0500 10/31/20 0500 11/09/20 0500  Weight: 76 kg 76 kg 74 kg    Exam:  Constitutional: Alert, in no acute distress Respiratory: #6.0 cuffless trach, trach collar with FiO2 21% at 5L,O2 sats 97 to 99%.  Lungs are clear.  No increased work of breathing. Cardiovascular: S1-S2, no tachycardia.  No peripheral edema.  Regular pulse. Abdomen:  PEG tube-LBM 4/26, soft and nontender with normoactive bowel sounds.  Tube feedings infusing at 55 cc/h. Neurologic: CN 2-12 intact except notable with left ocular disconjugate gaze with medial right downward gaze. Lower extremity foot splints in place.  Able to track with right eye. Patient has spontaneous purposeful movement that is gross motor in nature of right upper extremity. Left preferance torticollis and gen hypertonicity controlled.  Continues to track with right eye.  When patient overheard her mother discussing changes in health patient began to frown Psych: Alert.  Unable to accurately assess orientation given nonverbal state at this time.   Assessment/Plan: Acute problems: Multiple acute strokes  Right Thalamus ICH with IVH  Obstructive Hydrocephalus/Acute on persistent metabolic Encephalopathy/brain injury related hypertonicity Presented with headache and left-sided weakness.   Found to have [place].  Admitted to ICU under neurology with neurosurgery consult. Next day mentation worsened requiring EVD placement and intubation. EVD removed after 3 days on 2/13.  -EEG x 2 suggestive of cortical dysfunction in right hemisphere likely 2/2 underlying bleed as well as severe diffuse encephalopathy, nonspecific etiology . -Continue baclofen 5 mg at 8 AM and 4 PM and 7.5 mg at bedtime.  LFTs remain stable with baseline subtle elevation without change since initiation of baclofen.  Plan repeat LFTs around May 4th -Continue Provigil 200 mg daily as recommended by PMR  team -Add scheduled Senokot at HS given requirement for SMRs -OOB to chair daily and PROM every 4 hours per nursing staff -Had very successful OT session on 4/22.  Partial obstruction glasses placed to decrease diplopia with an immediate increase in ability to track left and right and to fixate more consistently and follow commands.  OT has thus recommended increase in frequency of OT/PT sessions and has also requested SLP be reconsulted. -4/25 patient did have nystagmus while sitting on edge of bed working with PT -this did not correct with placement of partial-occlusion glasses -Plan is to have PT and OT work with patient 2 times per week each on alternate days therefore patient would receive a total of 4 days/week of therapy  SLP evaluation 4/26: SLP consulted per OT request as Ms. Strada has begun to show more ability to follow commands and participate in therapy, albeit at fundamental levels.  Passy Muir valve assessment was completed - pt now with a #6 cuffless trach.  VS at baseline  were HR mid80s, Sp02 92-199%, RR 18-22.  Pt was repositioned to optimize participation; oral cavity was suctioned. PMV placed for intermittent intervals of 3-5 breath cycles to assess for upper airway patency.  Removal of valve revealed no backflow of air through trach. Pt achieved immediate, spontaneous, nonpurposeful voicing.  She tolerated valve for ten minutes without difficulty and with no changes in VS.  She tracked examiner and fixated gaze.  She demonstrated spontaneous swallowing response throughout session.  Recommend ongoing SLP for treatment with valve, initiate speech/language and bedside swallowing assessments. Pt may use valve with OT/PT during their therapy sessions.  Neck Contracture/torticollis -Controlled -Continue baclofen as above -- continue PT/OT weekly per their recommendations given patient's inability to effectively participate at this juncture.  Nursing staff performing PROM activities.   Family also instructed and has been performing intermittently. PT eval 4/25       Pt progressing slowly towards physical therapy goals.  Focus of session was EOB trunk stability activity, stretching cervical and trunk muscle groups in sitting, and assessing tone during standing attempt. Husband, Charlene [name] present throughout session and very helpful. Noted pt attempted to track with R eye only (occlusion glasses donned) towards midline, and was not able to maintain gaze. Nystagmus noted throughout session. Pt has been on PT caseload at 1x/week due to low functional level. Will trial an increase in frequency to 2x/week and alternate PT/OT days as able to maximize the therapy days per week. Will continue to follow and progress as able per POC.       OT eval 4/22:  After spasticity inhibitory techniques and with facilitation, pt was able to follow simple motor commands with Rt UE and moderate - maximal facilitation and assist to reach for a picture, touch her mouth, apply lotion to Lt UE and to legs.  She requires increased time to initiate movement, as well as assist to inhibit the spasticity and for motor planning deficits.  Occlusion lenses placed on her with nasal portion of Lt lens occluded to reduce possible diplopia.  Pt with immediate increase in ability to track Lt and Rt and to fixate more consistently.  Will continue to follow.     Hypertensive emergency/Malignant HTN -Home medications include Coreg and HCTZ which have not been reordered -Blood pressure is actually stable now on 10 mg Norvasc, Lopressor and hydralazine.   Acute hypoxemic respiratory failure secondary to CVA requiring mechanical ventilation and tracheostomy tube -Stable on trach collar and has been transitioned to a #6 cuffless trach -Has significant altered mentation and inability to manage oral secretions at present time is not a safe candidate for decannulation.  Discussed this with family as major obstacle in discharging to  home.  This will also decrease options for SNF placement. -Appreciate trach team assistance in management  -Scopolamine patch placed by PCCM on 2/27. Currently secretion does not appear to be a significant issue.  Dysphagia/Nutrition Status: Nutrition Problem: Inadequate oral intake Etiology: inability to eat Signs/Symptoms: NPO status Interventions: Tube feeding consisting of Osmolite 1.2 Cal at 55 cc/h; continue Prosource liquid 45 cc daily, free water 100 cc every 4 hours per tube; will also continue to follow CBGs regularly while on tube feedings and provide SSI Estimated body mass index is 27.15 kg/m as calculated from the following:   Height as of this encounter: 5' 5" (1.651 m).   Weight as of this encounter: 74 kg. If patient eventually discharged to home to teach family how to bolus feed as opposed to utilizing  continuous tube feeding infusions.     Other problems: Anasarca-resolved  -bilateral upper extremity edema, minimal LE edema - suspect related to prolonged hospitalization, hypoalbuminemia -upper extremity US -> age indeterminate SVT of right cephalic vein  ESBL positive Enterobacter UTI -> 100,000 colonies-sensitive to gentamicin imipenem, resistant to cefazolin, cefepime Septra and intermediate to Cipro, nitrofurantoin energetically. -Has completed 5 days of meropenem -DC Purewick during acute UTI -Contact isolation -started on Myrbetriq for suspected bladder spasm, change to Oxybutynin per tube   Elevated LFT's - last checked 04/04, Remain minimally elevated but stable without any obstructive pattern -Continue baclofen and statin  Tachycardia/suboptimal BP -Resolved -Was given 1L of NS and vfree water increased fom 100 c q 4 hrs to 200 cc  -HR and BP improved  Bacterial Conjunctivitis /periorbital cellulitis - MRI 3/7 orbits with L periorbital cellulitis  -- CT head 3/9 with persistent subtotal opacification of paranasal sinuses and mastoid air cells,  bubbly lucency at base of R maxillary sinus (likely odontogenic in origin) - CT maxillofacial 3/12 - improving cellulitis  - s/p erythromycin ointment - completed course of Ancef   Chronic pansinusitis/recurrent fevers 2/2 aspiration PNA;History of Serratia Pneumonia  Hospital Acquired Pneumonia:  -Tracheal aspirate from 2/25 and 3/1 with serratia - cefepime 2/27-3/5; repeat sputum cx 3/21 + serratia (likely colonization but w/ recurrent fever opted to treat) -Has completed antibiotics recently for bilateral orbital cellulitis -started on Cipro for tracheal aspirate positive for Serratia marcescens   -Repeat sinus CT demonstrates progressive complete bilateral maxillary sinus opacification with unchanged complete left sphenoid sinus opacification and right sphenoid sinus fluid c/w chronic state  -CT Chest 3/30 with aspiration PNA-dtr updated at bedside by attending MD- Cipro changed to Unasyn IV-Unasyn now completed -no increased O2 needs   Anemia -Stable, follow  HLD On Lipitor.  Continue statin on discharge.  Pain likely related to underlying hypertonicity -Significantly appears resolved and not using oxycodone -Morphine IV, oxycodone and Tylenol prn (watch elev LFTs) -Continue baclofen as above     Data Reviewed: Basic Metabolic Panel: Recent Labs  Lab 11/07/20 0319  NA 137  K 3.6  CL 100  CO2 30  GLUCOSE 129*  BUN 18  CREATININE 0.55  CALCIUM 9.1   Liver Function Tests: Recent Labs  Lab 11/07/20 0319  AST 41  ALT 69*  ALKPHOS 140*  BILITOT 0.3  PROT 6.6  ALBUMIN 2.6*   No results for input(s): LIPASE, AMYLASE in the last 168 hours. No results for input(s): AMMONIA in the last 168 hours. CBC: Recent Labs  Lab 11/07/20 0319  WBC 8.1  HGB 9.7*  HCT 30.8*  MCV 82.1  PLT 407*   Cardiac Enzymes: No results for input(s): CKTOTAL, CKMB, CKMBINDEX, TROPONINI in the last 168 hours. BNP (last 3 results) No results for input(s): BNP in the last 8760  hours.  ProBNP (last 3 results) No results for input(s): PROBNP in the last 8760 hours.  CBG: Recent Labs  Lab 11/10/20 1221 11/10/20 1714 11/10/20 2053 11/11/20 0006 11/11/20 0415  GLUCAP 147* 135* 122* 138* 151*    No results found for this or any previous visit (from the past 240 hour(s)).   Studies: No results found.  Scheduled Meds: . amLODipine  10 mg Per Tube Daily  . atorvastatin  40 mg Per Tube Daily  . baclofen  5 mg Per Tube 2 times per day  . baclofen  7.5 mg Per Tube Daily  . chlorhexidine gluconate (MEDLINE KIT)  15 mL Mouth  Rinse BID  . Chlorhexidine Gluconate Cloth  6 each Topical Daily  . enoxaparin (LOVENOX) injection  40 mg Subcutaneous Q24H  . feeding supplement (PROSource TF)  45 mL Per Tube Daily  . free water  100 mL Per Tube Q4H  . hydrALAZINE  50 mg Per Tube Q8H  . insulin aspart  0-9 Units Subcutaneous Q4H  . mouth rinse  15 mL Mouth Rinse 5 X Daily  . metoprolol tartrate  100 mg Per Tube BID  . modafinil  200 mg Per Tube Daily  . oxybutynin  5 mg Per Tube TID  . pantoprazole sodium  40 mg Per Tube BID  . sennosides  5 mL Per Tube QHS   Continuous Infusions: . sodium chloride Stopped (10/26/20 1445)  . feeding supplement (OSMOLITE 1.2 CAL) 1,000 mL (11/08/20 1435)    Principal Problem:   ICH (intracerebral hemorrhage) (HCC) Active Problems:   Intraparenchymal hemorrhage of brain (HCC)   Acute hypoxemic respiratory failure (HCC)   Fever   Endotracheal tube present   Status post tracheostomy ([place])   Palliative care by specialist   Muscle hypertonicity   Oropharyngeal dysphagia   Brain injury with loss of consciousness ([place])   Poor prognosis   Aspiration pneumonia of right lower lobe (HCC)   Gram-negative infection   Abnormal urinalysis   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   Acute cystitis without hematuria   Consultants:  Neurology no follow up specified. We'll call  at the time of discharge.  Neurosurgery.  No follow up specified. We'll Call at the time of discharge.  Palliative care.  No follow up specified. Pt will benefit from outpatient palliative medicine consult.  Inpatient rehab.  Not a candidate for CIR. "she'll have to demonstrate the ability to meaningfully participate in therapies and to show some carryover from day to day. If she goes home with family, extensive education will need to be provided."  If Provigil is ineffective,"can consider a trial of amantadine and ritalin"  PCCM continues to follow.  No decannulation.  Outpatient follow-up with trach clinic will be needed.   Procedures:  Tracheostomy  PEG tube  EEG  Echocardiogram  Antibiotics: Anti-infectives (From admission, onward)   Start     Dose/Rate Route Frequency Ordered Stop   10/27/20 0845  meropenem (MERREM) 1 g in sodium chloride 0.9 % 100 mL IVPB  Status:  Discontinued        1 g 200 mL/hr over 30 Minutes Intravenous Every 8 hours 10/27/20 0752 11/01/20 0720   10/26/20 1145  cefTRIAXone (ROCEPHIN) 1 g in sodium chloride 0.9 % 100 mL IVPB  Status:  Discontinued        1 g 200 mL/hr over 30 Minutes Intravenous Every 24 hours 10/26/20 1056 10/27/20 0752   10/14/20 1815  Ampicillin-Sulbactam (UNASYN) 3 g in sodium chloride 0.9 % 100 mL IVPB        3 g 200 mL/hr over 30 Minutes Intravenous Every 6 hours 10/14/20 1754 10/19/20 1340   10/08/20 0845  ciprofloxacin (CIPRO) tablet 500 mg  Status:  Discontinued        500 mg Per Tube 2 times daily 10/08/20 0754 10/14/20 1731   09/21/20 1545  ceFAZolin (ANCEF) IVPB 1 g/50 mL premix        1 g 100 mL/hr over 30 Minutes Intravenous Every 8 hours 09/21/20 1457 09/28/20 0647   09/20/20 0600  ceFAZolin (ANCEF) IVPB 1 g/50 mL premix  Status:  Discontinued        1 g 100 mL/hr over 30 Minutes Intravenous Every 8 hours 09/14/20 0903 09/14/20 0904   09/16/20 1300  vancomycin (VANCOREADY) IVPB 1250 mg/250 mL  Status:  Discontinued         1,250 mg 166.7 mL/hr over 90 Minutes Intravenous Every 24 hours 09/15/20 1232 09/17/20 0956   09/15/20 1230  vancomycin (VANCOREADY) IVPB 1250 mg/250 mL        1,250 mg 166.7 mL/hr over 90 Minutes Intravenous  Once 09/15/20 1131 09/15/20 1402   09/13/20 1400  ceFEPIme (MAXIPIME) 2 g in sodium chloride 0.9 % 100 mL IVPB        2 g 200 mL/hr over 30 Minutes Intravenous Every 8 hours 09/13/20 1341 09/19/20 2208   09/06/20 0600  vancomycin (VANCOREADY) IVPB 1250 mg/250 mL        1,250 mg 166.7 mL/hr over 90 Minutes Intravenous  Once 09/06/20 0541 09/06/20 0739   09/05/20 1400  ceFAZolin (ANCEF) IVPB 1 g/50 mL premix  Status:  Discontinued        1 g 100 mL/hr over 30 Minutes Intravenous Every 8 hours 09/05/20 1120 09/13/20 1341   08/30/20 0600  vancomycin (VANCOREADY) IVPB 500 mg/100 mL  Status:  Discontinued        500 mg 100 mL/hr over 60 Minutes Intravenous Every 12 hours 08/29/20 1554 09/01/20 1115   08/29/20 1700  ceFEPIme (MAXIPIME) 2 g in sodium chloride 0.9 % 100 mL IVPB  Status:  Discontinued        2 g 200 mL/hr over 30 Minutes Intravenous Every 12 hours 08/29/20 1554 09/01/20 1115   08/29/20 1645  vancomycin (VANCOREADY) IVPB 1250 mg/250 mL        1,250 mg 166.7 mL/hr over 90 Minutes Intravenous  Once 08/29/20 1554 08/29/20 1820       Time spent: 25 minutes    Erin Hearing ANP  [facility] 7 am - 330 pm/M-F for direct patient care and secure chat Please refer to Amion for contact info 76  Days

## 2020-11-12 LAB — GLUCOSE, CAPILLARY
Glucose-Capillary: 112 mg/dL — ABNORMAL HIGH (ref 70–99)
Glucose-Capillary: 122 mg/dL — ABNORMAL HIGH (ref 70–99)
Glucose-Capillary: 135 mg/dL — ABNORMAL HIGH (ref 70–99)
Glucose-Capillary: 136 mg/dL — ABNORMAL HIGH (ref 70–99)
Glucose-Capillary: 137 mg/dL — ABNORMAL HIGH (ref 70–99)
Glucose-Capillary: 142 mg/dL — ABNORMAL HIGH (ref 70–99)

## 2020-11-12 MED ORDER — OXYBUTYNIN CHLORIDE 5 MG/5ML PO SYRP
5.0000 mg | ORAL_SOLUTION | Freq: Two times a day (BID) | ORAL | Status: DC
  Administered 2020-11-12 – 2020-12-11 (×59): 5 mg
  Filled 2020-11-12 (×61): qty 5

## 2020-11-12 NOTE — Progress Notes (Signed)
Physical Therapy Treatment Patient Details Name: Charlene [name] MRN: [id number] DOB: August 09, 1965 Today's Date: 11/12/2020    History of Present Illness Pt is 55 y/o female presents to [facility] on 2/9 with L sided weakness and headaches. PMH includes anxiety, HTN. 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 s/p Right frontal ventricular catheter placement on 2/10, stopped functioning on 2/11.  MRI 2/11 shows right greater than left mesial temporal lobes / parahippocampal cortex, bilateral basal ganglia, and possibly the upper pons that are concerning for acute infarcts, most likely secondary to mass effect from the hemorrhage. ETT 2/10-present.  Pt with trach and PEG on 09/10/20. Hospital course complicated by persistent fever, Serratia pneumonia and now pre-orbital cellulitis.    PT Comments    Husband, Charlene [name] present throughout session and engaged in education. Focus of session included ROM activity with facilitation of scapular movement during shoulder flexion. L scapula overall tighter than R and more difficult to facilitate upwards rotation without second person to help stabilize trunk. Pt tracking picture of her children about 50% of the time, and noted that she was tracking her husband at the side of the bed at end of session. Will continue to follow and progress as able per POC.   Follow Up Recommendations  SNF     Equipment Recommendations  Hospital bed    Recommendations for Other Services Rehab consult     Precautions / Restrictions Precautions Precautions: Fall Precaution Comments: peg and trach Restrictions Weight Bearing Restrictions: No    Mobility  Bed Mobility Overal bed mobility: Needs Assistance Bed Mobility: Supine to Sit;Sit to Sidelying;Rolling Rolling: Max assist;Total assist         General bed mobility comments: +2 for all  aspects of rolling and repositioning in the bed.    Transfers                    Ambulation/Gait             General Gait Details: non ambulatory   Stairs             Wheelchair Mobility    Modified Rankin (Stroke Patients Only) Modified Rankin (Stroke Patients Only) Pre-Morbid Rankin Score: No symptoms Modified Rankin: Severe disability     Balance       Sitting balance - Comments: Not tested this session       Standing balance comment: unable                            Cognition Arousal/Alertness: Awake/alert;Lethargic Behavior During Therapy: Flat affect Overall Cognitive Status: Impaired/Different from baseline Area of Impairment: Attention;Following commands                   Current Attention Level: Focused   Following Commands: Follows one step commands inconsistently     Problem Solving: Slow processing;Decreased initiation;Requires verbal cues;Requires tactile cues General Comments: Pt fixated and tracked ~50% this session. She did not follow any commands outside of tracking this session.      Exercises General Exercises - Upper Extremity Shoulder Flexion: PROM;Left;10 reps;Supine Shoulder ABduction: PROM;Left;10 reps;Supine Elbow Flexion: PROM;Left;10 reps;Supine Elbow Extension: PROM;Left;10 reps;Supine Wrist Flexion: PROM;Left;10 reps;Supine Wrist Extension: PROM;Left;10 reps;Supine General Exercises - Lower Extremity Ankle Circles/Pumps: PROM;10 reps Heel Slides: PROM;10 reps Hip ABduction/ADduction: PROM;10 reps Hip Flexion/Marching: PROM;5 reps  General Comments        Pertinent Vitals/Pain Pain Assessment: Faces Faces Pain Scale: Hurts a little bit Pain Location: occasional grimacing with PROM activity Pain Descriptors / Indicators: Discomfort;Spasm;Grimacing Pain Intervention(s): Monitored during session    Home Living                      Prior Function            PT  Goals (current goals can now be found in the care plan section) Acute Rehab PT Goals Patient Stated Goal: Pt unable to state PT Goal Formulation: Patient unable to participate in goal setting Time For Goal Achievement: 11/17/20 Potential to Achieve Goals: Poor Progress towards PT goals: Progressing toward goals    Frequency    Min 2X/week      PT Plan Current plan remains appropriate    Co-evaluation              AM-PAC PT "6 Clicks" Mobility   Outcome Measure  Help needed turning from your back to your side while in a flat bed without using bedrails?: Total Help needed moving from lying on your back to sitting on the side of a flat bed without using bedrails?: Total Help needed moving to and from a bed to a chair (including a wheelchair)?: Total Help needed standing up from a chair using your arms (e.g., wheelchair or bedside chair)?: Total Help needed to walk in hospital room?: Total Help needed climbing 3-5 steps with a railing? : Total 6 Click Score: 6    End of Session Equipment Utilized During Treatment: Gait belt Activity Tolerance: Patient tolerated treatment well Patient left: in bed;with call bell/phone within reach;with bed alarm set;with family/visitor present Nurse Communication: Mobility status;Need for lift equipment PT Visit Diagnosis: Hemiplegia and hemiparesis;Other symptoms and signs involving the nervous system (R29.898);Other abnormalities of gait and mobility (R26.89) Hemiplegia - Right/Left: Right Hemiplegia - dominant/non-dominant: Dominant Hemiplegia - caused by: Cerebral infarction;Nontraumatic intracerebral hemorrhage;Other Nontraumatic intracranial hemorrhage     Time: 1420-1457 PT Time Calculation (min) (ACUTE ONLY): 37 min  Charges:  $Therapeutic Exercise: 8-22 mins $Therapeutic Activity: 8-22 mins                     Charlene [name], PT, DPT Acute Rehabilitation Services Pager: [phone] Office: [phone]    Charlene [name] 11/12/2020, 3:18 PM

## 2020-11-12 NOTE — Progress Notes (Signed)
CSW notified Waldon Merl, RN of scheduled [facility] RN visit for today.  Edwin Dada, MSW, LCSW Transitions of Care  Clinical Social Worker II [phone]

## 2020-11-12 NOTE — Progress Notes (Signed)
PROGRESS NOTE  RAVENNE WAYMENT JTT:[id number] DOB: 06/07/1966 DOA: 08/26/2020 PCP: Patient, [name] (Inactive)   LOS: 48 days   Brief Narrative / Interim history: 55 yo F with 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  Subsequently on 2/10, she developed obtundation and respiratory arrest leading to intubation.  She has remained persistently obtunded, now has a trach and a PEG.  Hospital course complicated by persistent fever, Serratia pneumonia and now pre-orbital cellulitis  2/09 Admit for ICH 2/10 EVD placed, later intubated due to obtundation and respiratory arrest 2/11 MRI Brain, hypertonic saline d/c'd, EVD not working. Husband did not consent to PICC  2/13 EVD removed 2/17 [place] discussion with family, PMT 2/24 Bedside trach and PEG 3/1 Transferred to [facility] 10/07/2020 sputum positive for Serratia marcescens 4/8 Enterobacter cloaca and urine culture MDR sensitive only to imipenem and gentamicin  Subjective / 24h Interval events: More alert than what I remember her from last month.  Nonverbal, can occasionally track me with her eyes and appears that she is trying to follow commands but is too weak to do so  Assessment & Plan: Principal Problem Multiple acute strokes  Right Thalamus ICH with IVH  Obstructive Hydrocephalus, Acute Metabolic Encephalopathy - CT 2/9 with acute hemorrhage with epicenter in the right thalamus.  Intraventricular penetration with blood filling the 3rd and forth ventricles and nearly filling the right lateral ventricle. MRI 2/11 with intraparenchymal hemorrhage centered within the right thalamus and basal ganglia extending into the right midbrain with intraventricular extension.  Effacement of prepontine cistern and similar 4 mm of leftward midline shift.  Separate areas of ristricted diffusion and edema involving the right greater than left mesial temporal lobes /  parahippocampal cortex, bilateral basal ganglia, and possibly the upper pons that are concerning for acute infarcts, most likely 2/2 mass effect from the hemorrhage (see report)/ Per neuro "scattered b/l hippocampus, posterior CC, bilateral thalamus, and upper pons, likely due to deep penetrating vessel compression by focal mass-effect".  2/19 MRI with acute to early subacute infarcts in the bilateral cerebral white matter and right cerebellum, new from 2/11 - interval evolution of multiple other infarcts present on prior MRI, increased hemorrhage along old right EVD tract (see report) - 2/21 head CT with expected evolution of ICH since 2/13 - echo with normal EF, no intracardiac source of embolism - CTA head/neck without intracranial arterial occlusion or high grade stenosis, no dissection, aneurysm, or hemodynamically significant stenosis of the carotid or vertebral arteries  -Continue baclofen 5 mg at 8 AM and 4 PM and 7.5 mg at bedtime.  LFTs remain stable with baseline subtle elevation without change since initiation of baclofen.  Plan repeat LFTs around May 4th -Continue Provigil 200 mg daily as recommended by PMR team -Add scheduled Senokot at HS given requirement for SMRs -OOB to chair daily and PROM every 4 hours per nursing staff -Had very successful OT session on 4/22.  Partial obstruction glasses placed to decrease diplopia with an immediate increase in ability to track left and right and to fixate more consistently and follow commands.  OT has thus recommended increase in frequency of OT/PT sessions and has also requested SLP be reconsulted. -Continue aggressive therapy -SLP evaluated 4/26 and she is beginning to have more ability to follow commands and participating therapy. Passy Muir valve assessment was completed - pt now with a #6 cuffless trach. PMV placed for intermittent intervals of 3-5 breath cycles to assess  for upper airway patency. Removal of valve revealed no backflow of air  through trach. Pt achieved immediate, spontaneous, nonpurposeful voicing. She tolerated valve for ten minutes without difficulty and with no changes in VS. She tracked examiner and fixated gaze. She demonstrated spontaneous swallowing response throughout session. Recommend ongoing SLP for treatment with valve, initiate speech/language and bedside swallowing assessments. Pt may use valve with OT/PT during their therapy sessions.  Active Problems Hypertensive emergency -Home medications include Coreg and HCTZ -Continue metoprolol  Neck Contracture  -Pt with head tilted to L, however appears improved, continue baclofen  Serratia Pneumonia  Hospital Acquired Pneumonia, ESBL positive Enterobacter UTI -She has completed several course of antibiotics  Acute hypoxemic respiratory failure secondary to CVA -S/P Trach -continue on trach collar -pulm continuing to follow for trach  Dysphagia -s/p peg on tube feeds, seems to continue to tolerate well  Possible bacterial sinusitis -Off antibiotics now  Sinus tachycardia -Overall improved on metoprolol  Bacterial Conjunctivitis /periorbital cellulitis -Resolved  Anasarca -Resolved -upper extremity US -> age indeterminate SVT of right cephalic vein  Elevated LFT's -mild, continue to monitor overall stable  Leukocytosis -Resolved on antibiotics  Anemia -Stable, follow  Scheduled Meds: . amLODipine  10 mg Per Tube Daily  . atorvastatin  40 mg Per Tube Daily  . baclofen  5 mg Per Tube 2 times per day  . baclofen  7.5 mg Per Tube Daily  . chlorhexidine gluconate (MEDLINE KIT)  15 mL Mouth Rinse BID  . Chlorhexidine Gluconate Cloth  6 each Topical Daily  . enoxaparin (LOVENOX) injection  40 mg Subcutaneous Q24H  . feeding supplement (PROSource TF)  45 mL Per Tube Daily  . free water  100 mL Per Tube Q4H  . hydrALAZINE  50 mg Per Tube Q8H  . insulin aspart  0-9 Units Subcutaneous Q4H  . mouth rinse  15 mL Mouth Rinse 5 X  Daily  . metoprolol tartrate  100 mg Per Tube BID  . modafinil  200 mg Per Tube Daily  . oxybutynin  5 mg Per Tube TID  . pantoprazole sodium  40 mg Per Tube BID  . sennosides  5 mL Per Tube QHS   Continuous Infusions: . sodium chloride Stopped (10/26/20 1445)  . feeding supplement (OSMOLITE 1.2 CAL) 1,000 mL (11/12/20 0623)   PRN Meds:.sodium chloride, acetaminophen **OR** acetaminophen (TYLENOL) oral liquid 160 mg/5 mL **OR** acetaminophen, hydrALAZINE, labetalol, morphine injection, oxyCODONE, polyethylene glycol, senna-docusate  Diet Orders (From admission, onward)    Start     Ordered   11/05/20 0741  Diet NPO time specified  Diet effective now        11/05/20 0740          DVT prophylaxis: enoxaparin (LOVENOX) injection 40 mg Start: 09/01/20 1600 SCD's Start: 08/27/20 0026    Code Status: Full Code  Family Communication: No family at bedside  Status is: Inpatient  Remains inpatient appropriate because:Inpatient level of care appropriate due to severity of illness  Dispo: The patient is from: Home              Anticipated d/c is to: SNF              Patient currently is not medically stable to d/c.   Difficult to place patient No   Level of care: Progressive  Consultants:  Palliative care Pulmonary  Neurology   Procedures:  Lurline Idol, PEG  Microbiology  None   Antimicrobials: Status post 2 courses of cefepime, status post  1 course of cefazolin   Objective: Vitals:   11/11/20 1954 11/11/20 2325 11/12/20 0345 11/12/20 0729  BP: 131/77 119/85 (!) 142/83 118/71  Pulse: 96 90 95   Resp: 19 19 (!) 21   Temp: 99 F (37.2 C) 99.1 F (37.3 C) 98.8 F (37.1 C) 98.6 F (37 C)  TempSrc: Axillary Axillary Axillary Axillary  SpO2: 100% 97% 97%   Weight:      Height:       No intake or output data in the 24 hours ending 11/12/20 1003 Filed Weights   10/30/20 0500 10/31/20 0500 11/09/20 0500  Weight: 76 kg 76 kg 74 kg    Examination:  Constitutional:  No distress Respiratory: no wheezing Cardiovascular: Regular rate and rhythm, no murmurs  Data Reviewed: I have independently reviewed following labs and imaging studies   CBC: Recent Labs  Lab 11/07/20 0319  WBC 8.1  HGB 9.7*  HCT 30.8*  MCV 82.1  PLT 032*   Basic Metabolic Panel: Recent Labs  Lab 11/07/20 0319  NA 137  K 3.6  CL 100  CO2 30  GLUCOSE 129*  BUN 18  CREATININE 0.55  CALCIUM 9.1   Liver Function Tests: Recent Labs  Lab 11/07/20 0319  AST 41  ALT 69*  ALKPHOS 140*  BILITOT 0.3  PROT 6.6  ALBUMIN 2.6*   Coagulation Profile: No results for input(s): INR, PROTIME in the last 168 hours. HbA1C: No results for input(s): HGBA1C in the last 72 hours. CBG: Recent Labs  Lab 11/11/20 1610 11/11/20 1953 11/11/20 2324 11/12/20 0342 11/12/20 0810  GLUCAP 90 110* 155* 142* 135*    No results found for this or any previous visit (from the past 240 hour(s)).   Radiology Studies: No results found. Marzetta Board, MD, PhD [facility]  Between 7 am - 7 pm I am available, please contact me via Amion or Securechat  Between 7 pm - 7 am I am not available, please contact night coverage MD/APP via Amion

## 2020-11-12 NOTE — Evaluation (Signed)
Clinical/Bedside Swallow Evaluation Patient Details  Name: Charlene [name] MRN: [id number] Date of Birth: 01-Mar-1966  Today's Date: 11/12/2020 Time: SLP Start Time (ACUTE ONLY): 1030 SLP Stop Time (ACUTE ONLY): 1040 SLP Time Calculation (min) (ACUTE ONLY): 10 min  Past Medical History:  Past Medical History:  Diagnosis Date  . Hypertension    Past Surgical History:  Past Surgical History:  Procedure Laterality Date  . CESAREAN SECTION    . ESOPHAGOGASTRODUODENOSCOPY N/A 09/10/2020   Procedure: ESOPHAGOGASTRODUODENOSCOPY (EGD);  Surgeon: Sheliah Hatch, De Blanch, MD;  Location: [facility] ENDOSCOPY;  Service: General;  Laterality: N/A;  . PEG PLACEMENT N/A 09/10/2020   Procedure: PERCUTANEOUS ENDOSCOPIC GASTROSTOMY (PEG) PLACEMENT;  Surgeon: Sheliah Hatch De Blanch, MD;  Location: [facility] ENDOSCOPY;  Service: General;  Laterality: N/A;   HPI:  Pt is 55 y/o female presents to [facility] on 2/9 with L sided weakness and headaches. PMH includes anxiety, HTN. 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 s/p Right frontal ventricular catheter placement on 2/10, stopped functioning on 2/11.  MRI 2/11 shows right greater than left mesial temporal lobes / parahippocampal cortex, bilateral basal ganglia, and possibly the upper pons that are concerning for acute infarcts, most likely secondary to mass effect from the hemorrhage. ETT 2/10; trach/PEG 09/10/20.  Trach changed to #6 cuffless 3/11. Hospital course complicated by persistent fever,  Serratia pneumonia and pre-orbital cellulitis.   Assessment / Plan / Recommendation Clinical Impression  Clinical swallow evaluation was limited due to pts impaired cognitive function and comorbiities. PMSV was utilized during assessment. Nursing reports diligent oral care prior to SLP arrival. Pt unable to follow commands for formal oral motor exam,  however generalized weakness was exhibited. Trialed single small ice chip. Pt with variable oral acceptance. She did exhibit some bolus manipulation with posterior propulsion. No palpable swallow initiation was exhibited. Pt with delayed cough, concerning for decreased airway protection. No futhter trials given due to pt performance and increased risk for aspiration and compromised respiratory function. Continued ST intervetion indicated for pt appropriateness of PO trials and readiness for instrumental assessment. Continue NPO with meds via alternative means and alternative nutrition.  SLP Visit Diagnosis: Dysphagia, unspecified (R13.10)    Aspiration Risk  Moderate aspiration risk;Severe aspiration risk    Diet Recommendation   NPO  Medication Administration: Via alternative means    Other  Recommendations Oral Care Recommendations: Oral care QID   Follow up Recommendations Skilled Nursing facility      Frequency and Duration min 3x week  4 weeks       Prognosis Prognosis for Safe Diet Advancement: Fair Barriers to Reach Goals: Severity of deficits;Cognitive deficits      Swallow Study   General Date of Onset: 08/26/20 HPI: Pt is 55 y/o female presents to [facility] on 2/9 with L sided weakness and headaches. PMH includes anxiety, HTN. 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 s/p Right frontal ventricular catheter placement on 2/10, stopped functioning on 2/11.  MRI 2/11 shows right greater than left mesial temporal lobes / parahippocampal cortex, bilateral basal ganglia, and possibly the upper pons that are concerning for acute infarcts, most likely secondary to mass effect from the hemorrhage. ETT 2/10; trach/PEG 09/10/20.  Trach changed to #6 cuffless 3/11. Hospital course complicated by persistent fever,  Serratia pneumonia and pre-orbital  cellulitis. Type of Study: Bedside Swallow Evaluation Previous Swallow Assessment: none on file Diet Prior to this Study: NPO Temperature Spikes Noted: No Respiratory Status: Trach Collar History of Recent Intubation: No Behavior/Cognition: Alert;Requires cueing Oral Cavity Assessment: Within Functional Limits Oral Care Completed by SLP: Recent completion by staff Oral Cavity - Dentition: Adequate natural dentition Vision: Impaired for self-feeding Self-Feeding Abilities: Total assist Patient Positioning: Upright in bed Baseline Vocal Quality: Low vocal intensity Volitional Cough: Cognitively unable to elicit Volitional Swallow: Unable to elicit    Oral/Motor/Sensory Function Overall Oral Motor/Sensory Function: Generalized oral weakness (pt unable to follow commands for formal oral motor evaluation)   Ice Chips Ice chips: Impaired Presentation: Spoon Oral Phase Impairments: Reduced lingual movement/coordination;Impaired mastication Oral Phase Functional Implications: Prolonged oral transit Pharyngeal Phase Impairments: Unable to trigger swallow;Cough - Delayed   Thin Liquid Thin Liquid: Not tested    Nectar Thick Nectar Thick Liquid: Not tested   Honey Thick Honey Thick Liquid: Not tested   Puree Puree: Not tested   Solid     Solid: Not tested      Charlene Gal MA, CCC-SLP Acute Rehabilitation Services   11/12/2020,11:04 AM

## 2020-11-12 NOTE — Progress Notes (Signed)
  Speech Language Pathology Treatment: [name] Speaking valve  Patient Details Name: Charlene [name] MRN: [id number] DOB: 04-21-1966 Today's Date: 11/12/2020 Time: 1015-1030 SLP Time Calculation (min) (ACUTE ONLY): 15 min  Assessment / Plan / Recommendation Clinical Impression  Followed up for PMSV intervention. Pt remains on trach collar with cuffless trach. Pt alert though unable to follow simple commands with repetition. Nursing reports good tolerance of trach collar and that pt has not needed frequent suctioning. PMSV placed, pt able to phonate with low vocal intensity with humming like verbal output. No attempts exhibited for functional communication despite cueing. PMV removed following 5 minute duration trials x2, some air trapping exhibited this date. Vitals however remained stable without evidence of tachypnea, tachycardia, or O2 desaturations. Continue PMSV use with therapies only and full supervision. SLP to continue to follow.    HPI HPI: Pt is 55 y/o female presents to [facility] on 2/9 with L sided weakness and headaches. PMH includes anxiety, HTN. 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 s/p Right frontal ventricular catheter placement on 2/10, stopped functioning on 2/11.  MRI 2/11 shows right greater than left mesial temporal lobes / parahippocampal cortex, bilateral basal ganglia, and possibly the upper pons that are concerning for acute infarcts, most likely secondary to mass effect from the hemorrhage. ETT 2/10; trach/PEG 09/10/20.  Trach changed to #6 cuffless 3/11. Hospital course complicated by persistent fever,  Serratia pneumonia and pre-orbital cellulitis.      SLP Plan  Continue with current plan of care       Recommendations         Patient may use Passy-Muir Speech Valve: During all therapies with supervision PMSV Supervision:  Full MD: Please consider changing trach tube to : Smaller size         Oral Care Recommendations: Oral care QID Follow up Recommendations: Skilled Nursing facility SLP Visit Diagnosis: Aphonia (R49.1) Plan: Continue with current plan of care       GO                Ardyth Gal MA, CCC-SLP Acute Rehabilitation Services   11/12/2020, 10:50 AM

## 2020-11-13 LAB — GLUCOSE, CAPILLARY
Glucose-Capillary: 130 mg/dL — ABNORMAL HIGH (ref 70–99)
Glucose-Capillary: 149 mg/dL — ABNORMAL HIGH (ref 70–99)
Glucose-Capillary: 133 mg/dL — ABNORMAL HIGH (ref 70–99)
Glucose-Capillary: 131 mg/dL — ABNORMAL HIGH (ref 70–99)
Glucose-Capillary: 135 mg/dL — ABNORMAL HIGH (ref 70–99)
Glucose-Capillary: 119 mg/dL — ABNORMAL HIGH (ref 70–99)

## 2020-11-13 NOTE — Progress Notes (Signed)
PROGRESS NOTE  Charlene [name] DHR:[id number] DOB: 30-Oct-1965 DOA: 08/26/2020 PCP: Patient, [name] (Inactive)   LOS: 16 days   Brief Narrative / Interim history: 55 yo F with 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  Subsequently on 2/10, she developed obtundation and respiratory arrest leading to intubation.  She has remained persistently obtunded, now has a trach and a PEG.  Hospital course complicated by persistent fever, Serratia pneumonia and now pre-orbital cellulitis  2/09 Admit for ICH 2/10 EVD placed, later intubated due to obtundation and respiratory arrest 2/11 MRI Brain, hypertonic saline d/c'd, EVD not working. Husband did not consent to PICC  2/13 EVD removed 2/17 [place] [facility] discussion with family, PMT 2/24 Bedside trach and PEG 3/1 Transferred to [facility] 10/07/2020 sputum positive for Serratia marcescens 4/8 Enterobacter cloaca and urine culture MDR sensitive only to imipenem and gentamicin  Subjective / 24h Interval events: Alert, does not talk but occasionally tracks me with her eyes.  Moves arms spontaneously, limited motions but does not follow commands  Assessment & Plan: Principal Problem Multiple acute strokes  Right Thalamus ICH with IVH  Obstructive Hydrocephalus, Acute Metabolic Encephalopathy - CT 2/9 with acute hemorrhage with epicenter in the right thalamus.  Intraventricular penetration with blood filling the 3rd and forth ventricles and nearly filling the right lateral ventricle. MRI 2/11 with intraparenchymal hemorrhage centered within the right thalamus and basal ganglia extending into the right midbrain with intraventricular extension.  Effacement of prepontine cistern and similar 4 mm of leftward midline shift.  Separate areas of ristricted diffusion and edema involving the right greater than left mesial temporal lobes / parahippocampal cortex, bilateral basal ganglia, and  possibly the upper pons that are concerning for acute infarcts, most likely 2/2 mass effect from the hemorrhage (see report)/ Per neuro "scattered b/l hippocampus, posterior CC, bilateral thalamus, and upper pons, likely due to deep penetrating vessel compression by focal mass-effect".  2/19 MRI with acute to early subacute infarcts in the bilateral cerebral white matter and right cerebellum, new from 2/11 - interval evolution of multiple other infarcts present on prior MRI, increased hemorrhage along old right EVD tract (see report) - 2/21 head CT with expected evolution of ICH since 2/13 - echo with normal EF, no intracardiac source of embolism - CTA head/neck without intracranial arterial occlusion or high grade stenosis, no dissection, aneurysm, or hemodynamically significant stenosis of the carotid or vertebral arteries  -Continue baclofen 5 mg at 8 AM and 4 PM and 7.5 mg at bedtime.  LFTs remain stable with baseline subtle elevation without change since initiation of baclofen.  Plan repeat LFTs around May 4th -Continue Provigil 200 mg daily as recommended by PMR team -Add scheduled Senokot at HS given requirement for SMRs -OOB to chair daily and PROM every 4 hours per nursing staff -Had very successful OT session on 4/22.  Partial obstruction glasses placed to decrease diplopia with an immediate increase in ability to track left and right and to fixate more consistently and follow commands.  OT has thus recommended increase in frequency of OT/PT sessions and has also requested SLP be reconsulted. -Continue aggressive therapy -SLP evaluated 4/26 and she is beginning to have more ability to follow commands and participating therapy. Passy Muir valve assessment was completed - pt now with a #6 cuffless trach. PMV placed for intermittent intervals of 3-5 breath cycles to assess for upper airway patency. Removal of valve revealed no backflow of air through  trach. Pt achieved immediate, spontaneous,  nonpurposeful voicing. She tolerated valve for ten minutes without difficulty and with no changes in VS. She tracked examiner and fixated gaze. She demonstrated spontaneous swallowing response throughout session. Recommend ongoing SLP for treatment with valve, initiate speech/language and bedside swallowing assessments. Pt may use valve with OT/PT during their therapy sessions.  Active Problems Hypertensive emergency -Home medications include Coreg and HCTZ -Continue metoprolol  Neck Contracture  -Pt with head tilted to L, however appears improved, continue baclofen  Serratia Pneumonia  Hospital Acquired Pneumonia, ESBL positive Enterobacter UTI -She has completed several course of antibiotics  Acute hypoxemic respiratory failure secondary to CVA -S/P Trach -continue on trach collar -pulm continuing to follow for trach  Dysphagia -s/p peg on tube feeds, seems to continue to tolerate well  Possible bacterial sinusitis -Off antibiotics now  Sinus tachycardia -Overall improved on metoprolol  Bacterial Conjunctivitis /periorbital cellulitis -Resolved  Anasarca -Resolved -upper extremity US -> age indeterminate SVT of right cephalic vein  Elevated LFT's -mild, continue to monitor overall stable  Leukocytosis -Resolved on antibiotics  Anemia -Stable, follow  Scheduled Meds: . amLODipine  10 mg Per Tube Daily  . atorvastatin  40 mg Per Tube Daily  . baclofen  5 mg Per Tube 2 times per day  . baclofen  7.5 mg Per Tube Daily  . chlorhexidine gluconate (MEDLINE KIT)  15 mL Mouth Rinse BID  . Chlorhexidine Gluconate Cloth  6 each Topical Daily  . enoxaparin (LOVENOX) injection  40 mg Subcutaneous Q24H  . feeding supplement (PROSource TF)  45 mL Per Tube Daily  . free water  100 mL Per Tube Q4H  . hydrALAZINE  50 mg Per Tube Q8H  . insulin aspart  0-9 Units Subcutaneous Q4H  . mouth rinse  15 mL Mouth Rinse 5 X Daily  . metoprolol tartrate  100 mg Per Tube BID  .  modafinil  200 mg Per Tube Daily  . oxybutynin  5 mg Per Tube BID  . pantoprazole sodium  40 mg Per Tube BID  . sennosides  5 mL Per Tube QHS   Continuous Infusions: . sodium chloride Stopped (10/26/20 1445)  . feeding supplement (OSMOLITE 1.2 CAL) 1,000 mL (11/13/20 0234)   PRN Meds:.sodium chloride, acetaminophen **OR** acetaminophen (TYLENOL) oral liquid 160 mg/5 mL **OR** acetaminophen, hydrALAZINE, labetalol, morphine injection, oxyCODONE, polyethylene glycol, senna-docusate  Diet Orders (From admission, onward)    Start     Ordered   11/05/20 0741  Diet NPO time specified  Diet effective now        11/05/20 0740          DVT prophylaxis: enoxaparin (LOVENOX) injection 40 mg Start: 09/01/20 1600 SCD's Start: 08/27/20 0026    Code Status: Full Code  Family Communication: No family at bedside  Status is: Inpatient  Remains inpatient appropriate because:Inpatient level of care appropriate due to severity of illness  Dispo: The patient is from: Home              Anticipated d/c is to: SNF              Patient currently is not medically stable to d/c.   Difficult to place patient No   Level of care: Progressive  Consultants:  Palliative care Pulmonary  Neurology   Procedures:  Lurline Idol, PEG  Microbiology  None   Antimicrobials: Status post 2 courses of cefepime, status post 1 course of cefazolin   Objective: Vitals:   11/13/20 0415 11/13/20  0421 11/13/20 0425 11/13/20 0824  BP:  (!) 175/101 (!) 156/111 (!) 156/97  Pulse:  98    Resp: 20 20    Temp: 98.9 F (37.2 C) 98.8 F (37.1 C)    TempSrc: Oral Oral    SpO2: 99% 98%  99%  Weight:      Height:       No intake or output data in the 24 hours ending 11/13/20 0845 Filed Weights   10/30/20 0500 10/31/20 0500 11/09/20 0500  Weight: 76 kg 76 kg 74 kg    Examination:  Constitutional: NAD  Data Reviewed: I have independently reviewed following labs and imaging studies   CBC: Recent Labs  Lab  11/28/2020 0319  WBC 8.1  HGB 9.7*  HCT 30.8*  MCV 82.1  PLT 177*   Basic Metabolic Panel: Recent Labs  Lab 2020-11-28 0319  NA 137  K 3.6  CL 100  CO2 30  GLUCOSE 129*  BUN 18  CREATININE 0.55  CALCIUM 9.1   Liver Function Tests: Recent Labs  Lab Nov 28, 2020 0319  AST 41  ALT 69*  ALKPHOS 140*  BILITOT 0.3  PROT 6.6  ALBUMIN 2.6*   Coagulation Profile: No results for input(s): INR, PROTIME in the last 168 hours. HbA1C: No results for input(s): HGBA1C in the last 72 hours. CBG: Recent Labs  Lab 11/12/20 1213 11/12/20 1552 11/12/20 1935 11/12/20 2338 11/13/20 0323  GLUCAP 137* 136* 122* 112* 130*    No results found for this or any previous visit (from the past 240 hour(s)).   Radiology Studies: No results found. Marzetta Board, MD, PhD [facility]  Between 7 am - 7 pm I am available, please contact me via Amion or Securechat  Between 7 pm - 7 am I am not available, please contact night coverage MD/APP via Amion

## 2020-11-13 NOTE — Progress Notes (Signed)
Occupational Therapy Treatment Patient Details Name: Charlene [name] MRN: [id number] DOB: Oct 29, 1965 Today's Date: 11/13/2020    History of present illness Pt is 55 y/o female presents to [facility] on 2/9 with L sided weakness and headaches. PMH includes anxiety, HTN. 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 s/p Right frontal ventricular catheter placement on 2/10, stopped functioning on 2/11.  MRI 2/11 shows right greater than left mesial temporal lobes / parahippocampal cortex, bilateral basal ganglia, and possibly the upper pons that are concerning for acute infarcts, most likely secondary to mass effect from the hemorrhage. ETT 2/10-present.  Pt with trach and PEG on 09/10/20. Hospital course complicated by persistent fever, Serratia pneumonia and now pre-orbital cellulitis.   OT comments  Pt seen to work on vision and look at extremity PROM, spontaneous movement of right eye with only one instance fo visual fixation noted (on a picture of her kids). Spontaneous movement patterns of Bil UEs (right more extension and left flexion) as well as unintentional movements of Bil UEs (with yawning and coughing, to pain). Feel she would really benefit from left soft elbow splint and right soft knee splint to help with further contractures. We will continue to follow.   Follow Up Recommendations  SNF;Supervision/Assistance - 24 hour    Equipment Recommendations  None recommended by OT       Precautions / Restrictions Precautions Precautions: Fall Precaution Comments: peg and trach Restrictions Weight Bearing Restrictions: No                ADL                                         General ADL Comments: pt requires total A     Vision   Additional Comments: Pt with occlusion lenses on and pt moving right eye left and right  spontaneously, but not with tracking. I did place a picture of her kids in her right hand and helped her hold, it did appear that she looked and fixated on it while supine in bed          Cognition Arousal/Alertness: Awake/alert (initally asleep) Behavior During Therapy: Flat affect (other than occassional grimace) Overall Cognitive Status: Impaired/Different from baseline Area of Impairment: Attention;Following commands                   Current Attention Level: Focused   Following Commands:  (not following one step commands today)                Exercises Other Exercises Other Exercises: Pt's husband brought in "massage gun", used this on her left triceps and was able to get her to extend arm passively and maintain at ~-30 degrees of extension (placed pillow between arm and body). Tried this also on her right thigh but did not help maintain her knee extension achieved by using it and gentle pressure on knee.           Pertinent Vitals/ Pain       Pain Assessment: Faces Faces Pain Scale: Hurts little more Pain Location: occasional grimacing with PROM activity of right leg (knee and heel) and left elbow extension Pain Descriptors / Indicators: Discomfort;Grimacing Pain Intervention(s): Limited activity within patient's tolerance;Monitored during session;Repositioned  Frequency  Min 2X/week        Progress Toward Goals  OT Goals(current goals can now be found in the care plan section)  Progress towards OT goals: Progressing toward goals (slowly)  Acute Rehab OT Goals Patient Stated Goal: Pt unable to state OT Goal Formulation: Patient unable to participate in goal setting Time For Goal Achievement: 11/20/20 Potential to Achieve Goals: Fair  Plan Discharge plan remains appropriate       AM-PAC OT "6 Clicks" Daily Activity     Outcome Measure   Help from another person eating meals?: Total Help from another person taking care of personal  grooming?: Total Help from another person toileting, which includes using toliet, bedpan, or urinal?: Total Help from another person bathing (including washing, rinsing, drying)?: Total Help from another person to put on and taking off regular upper body clothing?: Total Help from another person to put on and taking off regular lower body clothing?: Total 6 Click Score: 6    End of Session Equipment Utilized During Treatment: Oxygen  OT Visit Diagnosis: Muscle weakness (generalized) (M62.81);Apraxia (R48.2);Cognitive communication deficit (R41.841);Hemiplegia and hemiparesis Symptoms and signs involving cognitive functions: Other Nontraumatic ICH Hemiplegia - Right/Left:  (Bil) Hemiplegia - caused by: Other Nontraumatic intracranial hemorrhage   Activity Tolerance Patient tolerated treatment well   Patient Left in bed;with call bell/phone within reach;with family/visitor present   Nurse Communication          Time: 1441-1510 OT Time Calculation (min): 29 min  Charges: OT General Charges $OT Visit: 1 Visit OT Treatments $Therapeutic Exercise: 23-37 mins  Ignacia Palma, OTR/L Acute [employer] Pager [phone] Office [phone]      Evette Georges 11/13/2020, 4:11 PM

## 2020-11-13 NOTE — Plan of Care (Signed)
  Problem: Education: Goal: Knowledge of disease or condition will improve Outcome: Progressing Goal: Knowledge of patient specific risk factors addressed and post discharge goals established will improve Outcome: Progressing   Problem: Coping: Goal: Will identify appropriate support needs Outcome: Progressing   Problem: Intracerebral Hemorrhage Tissue Perfusion: Goal: Complications of Intracerebral Hemorrhage will be minimized Outcome: Progressing

## 2020-11-14 LAB — GLUCOSE, CAPILLARY
Glucose-Capillary: 109 mg/dL — ABNORMAL HIGH (ref 70–99)
Glucose-Capillary: 118 mg/dL — ABNORMAL HIGH (ref 70–99)
Glucose-Capillary: 120 mg/dL — ABNORMAL HIGH (ref 70–99)
Glucose-Capillary: 124 mg/dL — ABNORMAL HIGH (ref 70–99)
Glucose-Capillary: 127 mg/dL — ABNORMAL HIGH (ref 70–99)
Glucose-Capillary: 137 mg/dL — ABNORMAL HIGH (ref 70–99)

## 2020-11-14 NOTE — Progress Notes (Signed)
PROGRESS NOTE  Charlene [name] UXN:[id number] DOB: May 17, 1966 DOA: 08/26/2020 PCP: Patient, [name] (Inactive)   LOS: 45 days   Brief Narrative / Interim history: 55 yo F with 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  Subsequently on 2/10, she developed obtundation and respiratory arrest leading to intubation.  She has remained persistently obtunded, now has a trach and a PEG.  Hospital course complicated by persistent fever, Serratia pneumonia and now pre-orbital cellulitis  2/09 Admit for ICH 2/10 EVD placed, later intubated due to obtundation and respiratory arrest 2/11 MRI Brain, hypertonic saline d/c'd, EVD not working. Husband did not consent to PICC  2/13 EVD removed 2/17 [place] discussion with family, PMT 2/24 Bedside trach and PEG 3/1 Transferred to [facility] 10/07/2020 sputum positive for Serratia marcescens 4/8 Enterobacter cloaca and urine culture MDR sensitive only to imipenem and gentamicin  Subjective / 24h Interval events: Eyes are open but does not follow commands nor talks today  Assessment & Plan: Principal Problem Multiple acute strokes  Right Thalamus ICH with IVH  Obstructive Hydrocephalus, Acute Metabolic Encephalopathy - CT 2/9 with acute hemorrhage with epicenter in the right thalamus.  Intraventricular penetration with blood filling the 3rd and forth ventricles and nearly filling the right lateral ventricle. MRI 2/11 with intraparenchymal hemorrhage centered within the right thalamus and basal ganglia extending into the right midbrain with intraventricular extension.  Effacement of prepontine cistern and similar 4 mm of leftward midline shift.  Separate areas of ristricted diffusion and edema involving the right greater than left mesial temporal lobes / parahippocampal cortex, bilateral basal ganglia, and possibly the upper pons that are concerning for acute infarcts, most likely 2/2  mass effect from the hemorrhage (see report)/ Per neuro "scattered b/l hippocampus, posterior CC, bilateral thalamus, and upper pons, likely due to deep penetrating vessel compression by focal mass-effect".  2/19 MRI with acute to early subacute infarcts in the bilateral cerebral white matter and right cerebellum, new from 2/11 - interval evolution of multiple other infarcts present on prior MRI, increased hemorrhage along old right EVD tract (see report) - 2/21 head CT with expected evolution of ICH since 2/13 - echo with normal EF, no intracardiac source of embolism - CTA head/neck without intracranial arterial occlusion or high grade stenosis, no dissection, aneurysm, or hemodynamically significant stenosis of the carotid or vertebral arteries  -Continue baclofen 5 mg at 8 AM and 4 PM and 7.5 mg at bedtime.  LFTs remain stable with baseline subtle elevation without change since initiation of baclofen.  Plan repeat LFTs around May 4th -Continue Provigil 200 mg daily as recommended by PMR team -Add scheduled Senokot at HS given requirement for SMRs -OOB to chair daily and PROM every 4 hours per nursing staff -Had very successful OT session on 4/22.  Partial obstruction glasses placed to decrease diplopia with an immediate increase in ability to track left and right and to fixate more consistently and follow commands.  OT has thus recommended increase in frequency of OT/PT sessions and has also requested SLP be reconsulted. -Continue aggressive therapy -SLP evaluated 4/26 and she is beginning to have more ability to follow commands and participating therapy. Passy Muir valve assessment was completed - pt now with a #6 cuffless trach. PMV placed for intermittent intervals of 3-5 breath cycles to assess for upper airway patency. Removal of valve revealed no backflow of air through trach. Pt achieved immediate, spontaneous, nonpurposeful voicing. She tolerated valve for  ten minutes without difficulty and  with no changes in VS. She tracked examiner and fixated gaze. She demonstrated spontaneous swallowing response throughout session. Recommend ongoing SLP for treatment with valve, initiate speech/language and bedside swallowing assessments. Pt may use valve with OT/PT during their therapy sessions.  Active Problems Hypertensive emergency -Home medications include Coreg and HCTZ -Continue metoprolol  Neck Contracture  -Pt with head tilted to L, however appears improved, continue baclofen  Serratia Pneumonia  Hospital Acquired Pneumonia, ESBL positive Enterobacter UTI -She has completed several course of antibiotics  Acute hypoxemic respiratory failure secondary to CVA -S/P Trach -continue on trach collar -pulm continuing to follow for trach  Dysphagia -s/p peg on tube feeds, seems to continue to tolerate well  Possible bacterial sinusitis -Off antibiotics now  Sinus tachycardia -Overall improved on metoprolol  Bacterial Conjunctivitis /periorbital cellulitis -Resolved  Anasarca -Resolved -upper extremity US -> age indeterminate SVT of right cephalic vein  Elevated LFT's -mild, continue to monitor overall stable  Leukocytosis -Resolved on antibiotics  Anemia -Stable, follow  Scheduled Meds: . amLODipine  10 mg Per Tube Daily  . atorvastatin  40 mg Per Tube Daily  . baclofen  5 mg Per Tube 2 times per day  . baclofen  7.5 mg Per Tube Daily  . chlorhexidine gluconate (MEDLINE KIT)  15 mL Mouth Rinse BID  . Chlorhexidine Gluconate Cloth  6 each Topical Daily  . enoxaparin (LOVENOX) injection  40 mg Subcutaneous Q24H  . feeding supplement (PROSource TF)  45 mL Per Tube Daily  . free water  100 mL Per Tube Q4H  . hydrALAZINE  50 mg Per Tube Q8H  . insulin aspart  0-9 Units Subcutaneous Q4H  . mouth rinse  15 mL Mouth Rinse 5 X Daily  . metoprolol tartrate  100 mg Per Tube BID  . modafinil  200 mg Per Tube Daily  . oxybutynin  5 mg Per Tube BID  .  pantoprazole sodium  40 mg Per Tube BID  . sennosides  5 mL Per Tube QHS   Continuous Infusions: . sodium chloride Stopped (10/26/20 1445)  . feeding supplement (OSMOLITE 1.2 CAL) 1,000 mL (11/13/20 0234)   PRN Meds:.sodium chloride, acetaminophen **OR** acetaminophen (TYLENOL) oral liquid 160 mg/5 mL **OR** acetaminophen, hydrALAZINE, labetalol, morphine injection, oxyCODONE, polyethylene glycol, senna-docusate  Diet Orders (From admission, onward)    Start     Ordered   11/05/20 0741  Diet NPO time specified  Diet effective now        11/05/20 0740          DVT prophylaxis: enoxaparin (LOVENOX) injection 40 mg Start: 09/01/20 1600 SCD's Start: 08/27/20 0026    Code Status: Full Code  Family Communication: No family at bedside  Status is: Inpatient  Remains inpatient appropriate because:Inpatient level of care appropriate due to severity of illness  Dispo: The patient is from: Home              Anticipated d/c is to: SNF              Patient currently is not medically stable to d/c.   Difficult to place patient No   Level of care: Progressive  Consultants:  Palliative care Pulmonary  Neurology   Procedures:  Lurline Idol, PEG  Microbiology  None   Antimicrobials: Status post 2 courses of cefepime, status post 1 course of cefazolin   Objective: Vitals:   11/14/20 0800 11/14/20 0820 11/14/20 0853 11/14/20 0937  BP: (!) 144/79 (!) 139/93  124/82 (!) 144/79  Pulse: (!) 104 95 97 (!) 104  Resp: 20 (!) 21 18   Temp:   98.5 F (36.9 C)   TempSrc:   Axillary   SpO2: 100% 99% 100%   Weight:      Height:       No intake or output data in the 24 hours ending 11/14/20 1020 Filed Weights   10/31/20 0500 11/09/20 0500 11/14/20 0453  Weight: 76 kg 74 kg 72 kg    Examination:  Constitutional: No apparent distress Lungs: CTA Heart: Regular, no murmurs  Data Reviewed: I have independently reviewed following labs and imaging studies   CBC: No results for  input(s): WBC, NEUTROABS, HGB, HCT, MCV, PLT in the last 168 hours. Basic Metabolic Panel: No results for input(s): NA, K, CL, CO2, GLUCOSE, BUN, CREATININE, CALCIUM, MG, PHOS in the last 168 hours. Liver Function Tests: No results for input(s): AST, ALT, ALKPHOS, BILITOT, PROT, ALBUMIN in the last 168 hours. Coagulation Profile: No results for input(s): INR, PROTIME in the last 168 hours. HbA1C: No results for input(s): HGBA1C in the last 72 hours. CBG: Recent Labs  Lab 11/13/20 1629 11/13/20 2026 11/13/20 2322 11/14/20 0312 11/14/20 0815  GLUCAP 131* 135* 119* 124* 109*    No results found for this or any previous visit (from the past 240 hour(s)).   Radiology Studies: No results found. Marzetta Board, MD, PhD [facility]  Between 7 am - 7 pm I am available, please contact me via Amion or Securechat  Between 7 pm - 7 am I am not available, please contact night coverage MD/APP via Amion

## 2020-11-14 NOTE — Progress Notes (Signed)
Occupational Therapy Treatment Patient Details Name: Charlene [name] MRN: [id number] DOB: [birth year] Today's Date: 11/14/2020    History of present illness Pt is 55 y/o female presents to [facility] on 2/9 with L sided weakness and headaches. PMH includes anxiety, HTN. 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 s/p Right frontal ventricular catheter placement on 2/10, stopped functioning on 2/11.  MRI 2/11 shows right greater than left mesial temporal lobes / parahippocampal cortex, bilateral basal ganglia, and possibly the upper pons that are concerning for acute infarcts, most likely secondary to mass effect from the hemorrhage. ETT 2/10-present.  Pt with trach and PEG on 09/10/20. Hospital course complicated by persistent fever, Serratia pneumonia and now pre-orbital cellulitis.   OT comments  Pt seen to check splint wearing tolerance. No issues noted on LUE or RLE after splints being on for ~ one hour. PT in room and they agreed they would put splints back on post seeing patient. Wearing schedule established and added to Nursing orders with a 4 hour on and 4 hour off schedule. Spoke to RN over phone as well. We will continue to follow.                                                            Progress Toward Goals  OT Goals(current goals can now be found in the care plan section)  Progress towards OT goals: Progressing toward goals  Acute Rehab OT Goals Patient Stated Goal: Pt unable to state                           Time: 0925-0933 OT Time Calculation (min): 8 min  Charges: OT General Charges $OT Visit: 1 Visit OT Treatments $Orthotics Fit/Training: 8-22 mins $Orthotics/Prosthetics Check: 8-22 mins  Ignacia Palma, OTR/L Acute Rehab Services Pager [phone] Office [phone]      Evette Georges 11/14/2020, 11:49 AM

## 2020-11-14 NOTE — Progress Notes (Signed)
Physical Therapy Treatment Patient Details Name: Charlene [name] MRN: [id number] DOB: 03-05-1966 Today's Date: 11/14/2020    History of Present Illness Pt is 55 y/o female presents to [facility] on 2/9 with L sided weakness and headaches. PMH includes anxiety, HTN. 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 s/p Right frontal ventricular catheter placement on 2/10, stopped functioning on 2/11.  MRI 2/11 shows right greater than left mesial temporal lobes / parahippocampal cortex, bilateral basal ganglia, and possibly the upper pons that are concerning for acute infarcts, most likely secondary to mass effect from the hemorrhage. ETT 2/10-present.  Pt with trach and PEG on 09/10/20. Hospital course complicated by persistent fever, Serratia pneumonia and now pre-orbital cellulitis.    PT Comments    Patient awake on arrival and alert throughout nearly 1 hour session. She continues with primarily extensor tone in RUE, flexor tone in LUE, and flexor tone in bil LEs. PROM performed for all extremities, cervical and trunk in both sitting and supine. Patient with minimal tracking in supine (with occlusive glasses on) and did not follow commands with RUE with incr time, positioning, and tactile with verbal cues.    Follow Up Recommendations  SNF     Equipment Recommendations  Hospital bed    Recommendations for Other Services       Precautions / Restrictions Precautions Precautions: Fall Precaution Comments: peg and trach Restrictions Weight Bearing Restrictions: No    Mobility  Bed Mobility Overal bed mobility: Needs Assistance Bed Mobility: Supine to Sit;Sit to Sidelying;Rolling Rolling: Total assist Sidelying to sit: Total assist;+2 for physical assistance   Sit to supine: Total assist;+2 for physical assistance   General bed mobility comments: +2 for  all aspects of rolling and repositioning in the bed.    Transfers                    Ambulation/Gait             General Gait Details: non ambulatory   Stairs             Wheelchair Mobility    Modified Rankin (Stroke Patients Only) Modified Rankin (Stroke Patients Only) Pre-Morbid Rankin Score: No symptoms Modified Rankin: Severe disability     Balance Overall balance assessment: Needs assistance Sitting-balance support: No upper extremity supported;Feet supported Sitting balance-Leahy Scale: Poor   Postural control: Posterior lean;Left lateral lean     Standing balance comment: unable                            Cognition Arousal/Alertness: Awake/alert Behavior During Therapy: Flat affect Overall Cognitive Status: Impaired/Different from baseline Area of Impairment: Attention;Following commands                   Current Attention Level: Focused   Following Commands:  (not following one step commands today even when given up to 90 seconds and tactile cues)     Problem Solving: Slow processing;Decreased initiation;Requires verbal cues;Requires tactile cues General Comments: in supine pt fixated and tracked 1 of 4 trials this session. In sitting, she was not able to track. She did not follow any commands outside of tracking this session.      Exercises Other Exercises Other Exercises: cervical extension to neutral stretch, and L and rt cervical rotation stretch both in sitting and  supine. Educated mom on working on extension by removing pillow for periods of time and assisted rotation Other Exercises: Donned bil PRAFOs, rt soft knee extension brace and Left elbow soft extension brace at end of session    General Comments General comments (skin integrity, edema, etc.): Sat EOB ~10 minutes with attempts at raising her head, tracking, or turning her head. pt able to prop on her right elbow/forearm wiht minguard assist x 30 seconds  and ?assisted with pushing back up to midline (vs her usual rt elbow extensor tone)      Pertinent Vitals/Pain Pain Assessment: Faces Faces Pain Scale: Hurts a little bit Pain Location: briefly frowning at end of session Pain Descriptors / Indicators: Discomfort Pain Intervention(s): Monitored during session    Home Living                      Prior Function            PT Goals (current goals can now be found in the care plan section) Acute Rehab PT Goals Patient Stated Goal: Pt unable to state Time For Goal Achievement: 11/17/20 Potential to Achieve Goals: Poor Progress towards PT goals: Progressing toward goals    Frequency    Min 2X/week      PT Plan Current plan remains appropriate    Co-evaluation              AM-PAC PT "6 Clicks" Mobility   Outcome Measure  Help needed turning from your back to your side while in a flat bed without using bedrails?: Total Help needed moving from lying on your back to sitting on the side of a flat bed without using bedrails?: Total Help needed moving to and from a bed to a chair (including a wheelchair)?: Total Help needed standing up from a chair using your arms (e.g., wheelchair or bedside chair)?: Total Help needed to walk in hospital room?: Total Help needed climbing 3-5 steps with a railing? : Total 6 Click Score: 6    End of Session   Activity Tolerance: Patient tolerated treatment well Patient left: in bed;with call bell/phone within reach;with bed alarm set;with family/visitor present Nurse Communication: Mobility status;Need for lift equipment;Other (comment) (nursing in to assist with cleaning pt from incontinence prior to session) PT Visit Diagnosis: Hemiplegia and hemiparesis;Other symptoms and signs involving the nervous system (R29.898);Other abnormalities of gait and mobility (R26.89) Hemiplegia - Right/Left: Right Hemiplegia - dominant/non-dominant: Dominant Hemiplegia - caused by: Cerebral  infarction;Nontraumatic intracerebral hemorrhage;Other Nontraumatic intracranial hemorrhage     Time: 0919-1012 PT Time Calculation (min) (ACUTE ONLY): 53 min  Charges:  $Therapeutic Exercise: 8-22 mins $Neuromuscular Re-education: 38-52 mins                      Jerolyn Center, PT Pager [phone]    Zena Amos 11/14/2020, 10:40 AM

## 2020-11-14 NOTE — Progress Notes (Signed)
Occupational Therapy Treatment Patient Details Name: Charlene [name] MRN: [id number] DOB: 04-Sep-1965 Today's Date: 11/14/2020    History of present illness Pt is 55 y/o female presents to [facility] on 2/9 with L sided weakness and headaches. PMH includes anxiety, HTN. 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 s/p Right frontal ventricular catheter placement on 2/10, stopped functioning on 2/11.  MRI 2/11 shows right greater than left mesial temporal lobes / parahippocampal cortex, bilateral basal ganglia, and possibly the upper pons that are concerning for acute infarcts, most likely secondary to mass effect from the hemorrhage. ETT 2/10-present.  Pt with trach and PEG on 09/10/20. Hospital course complicated by persistent fever, Serratia pneumonia and now pre-orbital cellulitis.   OT comments  Pt seen for trial of use of soft splint for left elbow and right knee. Splints placed and will check back in an hour to see for tolerance of wearing and set up an appropriate schedule at that time.                                            Time: 6389-3734 OT Time Calculation (min): 17 min  Charges: OT General Charges $OT Visit: 1 Visit OT Treatments $Orthotics Fit/Training: 8-22 mins Ignacia Palma, OTR/L Acute [employer] Pager [phone] Office [phone]      Evette Georges 11/14/2020, 8:52 AM

## 2020-11-15 LAB — CBC
HCT: 32 % — ABNORMAL LOW (ref 36.0–46.0)
Hemoglobin: 10.1 g/dL — ABNORMAL LOW (ref 12.0–15.0)
MCH: 25.3 pg — ABNORMAL LOW (ref 26.0–34.0)
MCHC: 31.6 g/dL (ref 30.0–36.0)
MCV: 80 fL (ref 80.0–100.0)
Platelets: 390 10*3/uL (ref 150–400)
RBC: 4 MIL/uL (ref 3.87–5.11)
RDW: 15.6 % — ABNORMAL HIGH (ref 11.5–15.5)
WBC: 5.7 10*3/uL (ref 4.0–10.5)
nRBC: 0 % (ref 0.0–0.2)

## 2020-11-15 LAB — GLUCOSE, CAPILLARY
Glucose-Capillary: 116 mg/dL — ABNORMAL HIGH (ref 70–99)
Glucose-Capillary: 126 mg/dL — ABNORMAL HIGH (ref 70–99)
Glucose-Capillary: 136 mg/dL — ABNORMAL HIGH (ref 70–99)
Glucose-Capillary: 139 mg/dL — ABNORMAL HIGH (ref 70–99)
Glucose-Capillary: 91 mg/dL (ref 70–99)
Glucose-Capillary: 137 mg/dL — ABNORMAL HIGH (ref 70–99)

## 2020-11-15 LAB — COMPREHENSIVE METABOLIC PANEL
ALT: 41 U/L (ref 0–44)
AST: 25 U/L (ref 15–41)
Albumin: 2.7 g/dL — ABNORMAL LOW (ref 3.5–5.0)
Alkaline Phosphatase: 140 U/L — ABNORMAL HIGH (ref 38–126)
Anion gap: 10 (ref 5–15)
BUN: 16 mg/dL (ref 6–20)
CO2: 30 mmol/L (ref 22–32)
Calcium: 9.3 mg/dL (ref 8.9–10.3)
Chloride: 99 mmol/L (ref 98–111)
Creatinine, Ser: 0.5 mg/dL (ref 0.44–1.00)
GFR, Estimated: >60 mL/min (ref >60)
Glucose, Bld: 103 mg/dL — ABNORMAL HIGH (ref 70–99)
Potassium: 3.8 mmol/L (ref 3.5–5.1)
Sodium: 139 mmol/L (ref 135–145)
Total Bilirubin: 0.3 mg/dL (ref 0.3–1.2)
Total Protein: 6.9 g/dL (ref 6.5–8.1)

## 2020-11-15 NOTE — Progress Notes (Signed)
PROGRESS NOTE  LEONIA HEATHERLY XTG:[id number] DOB: 1965-12-04 DOA: 08/26/2020 PCP: Patient, [name] (Inactive)   LOS: 80 days   Brief Narrative / Interim history: 55 yo F with 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  Subsequently on 2/10, she developed obtundation and respiratory arrest leading to intubation.  She has remained persistently obtunded, now has a trach and a PEG.  Hospital course complicated by persistent fever, Serratia pneumonia and now pre-orbital cellulitis  2/09 Admit for ICH 2/10 EVD placed, later intubated due to obtundation and respiratory arrest 2/11 MRI Brain, hypertonic saline d/c'd, EVD not working. Husband did not consent to PICC  2/13 EVD removed 2/17 [place] discussion with family, PMT 2/24 Bedside trach and PEG 3/1 Transferred to [facility] 10/07/2020 sputum positive for Serratia marcescens 4/8 Enterobacter cloaca and urine culture MDR sensitive only to imipenem and gentamicin  Subjective / 24h Interval events: Slightly sleepier today.  Appears to have bitten right lower lip and its moderately swollen  Assessment & Plan: Principal Problem Multiple acute strokes  Right Thalamus ICH with IVH  Obstructive Hydrocephalus, Acute Metabolic Encephalopathy - CT 2/9 with acute hemorrhage with epicenter in the right thalamus.  Intraventricular penetration with blood filling the 3rd and forth ventricles and nearly filling the right lateral ventricle. MRI 2/11 with intraparenchymal hemorrhage centered within the right thalamus and basal ganglia extending into the right midbrain with intraventricular extension.  Effacement of prepontine cistern and similar 4 mm of leftward midline shift.  Separate areas of ristricted diffusion and edema involving the right greater than left mesial temporal lobes / parahippocampal cortex, bilateral basal ganglia, and possibly the upper pons that are concerning for  acute infarcts, most likely 2/2 mass effect from the hemorrhage (see report)/ Per neuro "scattered b/l hippocampus, posterior CC, bilateral thalamus, and upper pons, likely due to deep penetrating vessel compression by focal mass-effect".  2/19 MRI with acute to early subacute infarcts in the bilateral cerebral white matter and right cerebellum, new from 2/11 - interval evolution of multiple other infarcts present on prior MRI, increased hemorrhage along old right EVD tract (see report) - 2/21 head CT with expected evolution of ICH since 2/13 - echo with normal EF, no intracardiac source of embolism - CTA head/neck without intracranial arterial occlusion or high grade stenosis, no dissection, aneurysm, or hemodynamically significant stenosis of the carotid or vertebral arteries  -Continue baclofen 5 mg at 8 AM and 4 PM and 7.5 mg at bedtime.  LFTs remain stable with baseline subtle elevation without change since initiation of baclofen.  Plan repeat LFTs around May 4th -Continue Provigil 200 mg daily as recommended by PMR team -Add scheduled Senokot at HS given requirement for SMRs -OOB to chair daily and PROM every 4 hours per nursing staff -Had very successful OT session on 4/22.  Partial obstruction glasses placed to decrease diplopia with an immediate increase in ability to track left and right and to fixate more consistently and follow commands.  OT has thus recommended increase in frequency of OT/PT sessions and has also requested SLP be reconsulted. -Continue aggressive therapy -SLP evaluated 4/26 and she is beginning to have more ability to follow commands and participating therapy. Passy Muir valve assessment was completed - pt now with a #6 cuffless trach. PMV placed for intermittent intervals of 3-5 breath cycles to assess for upper airway patency. Removal of valve revealed no backflow of air through trach. Pt achieved immediate, spontaneous, nonpurposeful voicing.  She tolerated valve for ten  minutes without difficulty and with no changes in VS. She tracked examiner and fixated gaze. She demonstrated spontaneous swallowing response throughout session. Recommend ongoing SLP for treatment with valve, initiate speech/language and bedside swallowing assessments. Pt may use valve with OT/PT during their therapy sessions.  Active Problems Hypertensive emergency -Home medications include Coreg and HCTZ -Continue metoprolol  Neck Contracture  -Pt with head tilted to L, however appears improved, continue baclofen  Serratia Pneumonia  Hospital Acquired Pneumonia, ESBL positive Enterobacter UTI -She has completed several course of antibiotics  Acute hypoxemic respiratory failure secondary to CVA -S/P Trach -continue on trach collar -pulm continuing to follow for trach  Dysphagia -s/p peg on tube feeds, seems to continue to tolerate well  Possible bacterial sinusitis -Off antibiotics now  Sinus tachycardia -Overall improved on metoprolol  Bacterial Conjunctivitis /periorbital cellulitis -Resolved  Anasarca -Resolved -upper extremity US -> age indeterminate SVT of right cephalic vein  Elevated LFT's -mild, continue to monitor overall stable  Leukocytosis -Resolved on antibiotics  Anemia -Stable, follow  Scheduled Meds: . amLODipine  10 mg Per Tube Daily  . atorvastatin  40 mg Per Tube Daily  . baclofen  5 mg Per Tube 2 times per day  . baclofen  7.5 mg Per Tube Daily  . chlorhexidine gluconate (MEDLINE KIT)  15 mL Mouth Rinse BID  . Chlorhexidine Gluconate Cloth  6 each Topical Daily  . enoxaparin (LOVENOX) injection  40 mg Subcutaneous Q24H  . feeding supplement (PROSource TF)  45 mL Per Tube Daily  . free water  100 mL Per Tube Q4H  . hydrALAZINE  50 mg Per Tube Q8H  . insulin aspart  0-9 Units Subcutaneous Q4H  . mouth rinse  15 mL Mouth Rinse 5 X Daily  . metoprolol tartrate  100 mg Per Tube BID  . modafinil  200 mg Per Tube Daily  . oxybutynin  5  mg Per Tube BID  . pantoprazole sodium  40 mg Per Tube BID  . sennosides  5 mL Per Tube QHS   Continuous Infusions: . sodium chloride Stopped (10/26/20 1445)  . feeding supplement (OSMOLITE 1.2 CAL) 1,000 mL (11/14/20 1653)   PRN Meds:.sodium chloride, acetaminophen **OR** acetaminophen (TYLENOL) oral liquid 160 mg/5 mL **OR** acetaminophen, hydrALAZINE, labetalol, morphine injection, oxyCODONE, polyethylene glycol, senna-docusate  Diet Orders (From admission, onward)    Start     Ordered   11/05/20 0741  Diet NPO time specified  Diet effective now        11/05/20 0740          DVT prophylaxis: enoxaparin (LOVENOX) injection 40 mg Start: 09/01/20 1600 SCD's Start: 08/27/20 0026    Code Status: Full Code  Family Communication: No family at bedside  Status is: Inpatient  Remains inpatient appropriate because:Inpatient level of care appropriate due to severity of illness  Dispo: The patient is from: Home              Anticipated d/c is to: SNF              Patient currently is not medically stable to d/c.   Difficult to place patient No   Level of care: Progressive  Consultants:  Palliative care Pulmonary  Neurology   Procedures:  Lurline Idol, PEG  Microbiology  None   Antimicrobials: Status post 2 courses of cefepime, status post 1 course of cefazolin   Objective: Vitals:   11/15/20 0326 11/15/20 0500 11/15/20 0746 11/15/20 0856  BP:  118/75  124/83   Pulse: 72  78 98  Resp: 15  15 (!) 21  Temp: 98.6 F (37 C)  (!) 97.5 F (36.4 C)   TempSrc: Oral  Oral   SpO2: 95%  97% 100%  Weight:  73.2 kg    Height:       No intake or output data in the 24 hours ending Dec 11, 2020 0956 Filed Weights   11/09/20 0500 11/14/20 0453 Dec 11, 2020 0500  Weight: 74 kg 72 kg 73.2 kg    Examination:  Constitutional: NAD Lungs: Clear no wheezing Heart: Regular rate and rhythm  Data Reviewed: I have independently reviewed following labs and imaging studies   CBC: Recent Labs   Lab 12/11/2020 0150  WBC 5.7  HGB 10.1*  HCT 32.0*  MCV 80.0  PLT 073   Basic Metabolic Panel: Recent Labs  Lab 12-11-2020 0150  NA 139  K 3.8  CL 99  CO2 30  GLUCOSE 103*  BUN 16  CREATININE 0.50  CALCIUM 9.3   Liver Function Tests: Recent Labs  Lab 12/11/20 0150  AST 25  ALT 41  ALKPHOS 140*  BILITOT 0.3  PROT 6.9  ALBUMIN 2.7*   Coagulation Profile: No results for input(s): INR, PROTIME in the last 168 hours. HbA1C: No results for input(s): HGBA1C in the last 72 hours. CBG: Recent Labs  Lab 11/14/20 1547 11/14/20 1950 11/14/20 2345 12/11/20 0345 Dec 11, 2020 0746  GLUCAP 127* 118* 120* 116* 126*    No results found for this or any previous visit (from the past 240 hour(s)).   Radiology Studies: No results found. Marzetta Board, MD, PhD [facility]  Between 7 am - 7 pm I am available, please contact me via Amion or Securechat  Between 7 pm - 7 am I am not available, please contact night coverage MD/APP via Amion

## 2020-11-16 LAB — GLUCOSE, CAPILLARY
Glucose-Capillary: 104 mg/dL — ABNORMAL HIGH (ref 70–99)
Glucose-Capillary: 120 mg/dL — ABNORMAL HIGH (ref 70–99)
Glucose-Capillary: 123 mg/dL — ABNORMAL HIGH (ref 70–99)
Glucose-Capillary: 126 mg/dL — ABNORMAL HIGH (ref 70–99)
Glucose-Capillary: 135 mg/dL — ABNORMAL HIGH (ref 70–99)
Glucose-Capillary: 139 mg/dL — ABNORMAL HIGH (ref 70–99)
Glucose-Capillary: 139 mg/dL — ABNORMAL HIGH (ref 70–99)

## 2020-11-16 MED ORDER — WHITE PETROLATUM EX OINT
TOPICAL_OINTMENT | CUTANEOUS | Status: AC
  Filled 2020-11-16: qty 28.35

## 2020-11-16 NOTE — Progress Notes (Signed)
[place] PHYSICAL MEDICINE AND REHABILITATION  CONSULT SERVICE NOTE   Mrs. Stukes unfortunately has not made any meaningful functional gains over the last month. She remains most appropriate for SNF level care.    Ranelle Oyster, MD, [facility] Physical Medicine & Rehabilitation 11/16/2020

## 2020-11-16 NOTE — Progress Notes (Signed)
Physical Therapy Treatment Patient Details Name: Charlene [name] MRN: [id number] DOB: Mar 16, 1966 Today's Date: 11/16/2020    History of Present Illness Pt is 55 y/o female presents to [facility] on 2/9 with L sided weakness and headaches. PMH includes anxiety, HTN. 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 s/p Right frontal ventricular catheter placement on 2/10, stopped functioning on 2/11.  MRI 2/11 shows right greater than left mesial temporal lobes / parahippocampal cortex, bilateral basal ganglia, and possibly the upper pons that are concerning for acute infarcts, most likely secondary to mass effect from the hemorrhage. ETT 2/10-present.  Pt with trach and PEG on 09/10/20. Hospital course complicated by persistent fever, Serratia pneumonia and now pre-orbital cellulitis.    PT Comments    Focus of session was OOB to chair with [facility] lift, as well as positioning and PROM x4 extermities and passive cervical stretching. Overall pt tolerated treatment well, however noted some grimacing with ROM activity. Pt positioned with roll behind neck to promote cervical retraction and extension into neutral. Head pillow utilized under LE's with clean socks donned, to decrease pressure under calves, float heels, and promote B knee extension. Will continue to follow and progress as able per POC.   Follow Up Recommendations  SNF     Equipment Recommendations  Hospital bed    Recommendations for Other Services Rehab consult     Precautions / Restrictions Precautions Precautions: Fall Precaution Comments: peg and trach Required Braces or Orthoses: Other Brace Other Brace: RLE sock knee brace and LUE soft elbow brace (wearing schedule in RN orders) Restrictions Weight Bearing Restrictions: No    Mobility  Bed Mobility Overal bed mobility: Needs Assistance    Rolling: Total assist         General bed mobility comments: Grossly total assist for rolling. 1 time tone kicked in and assisted roll to the R however unintentional.    Transfers Overall transfer level: Needs assistance                  Ambulation/Gait             General Gait Details: non ambulatory   Stairs             Wheelchair Mobility    Modified Rankin (Stroke Patients Only) Modified Rankin (Stroke Patients Only) Pre-Morbid Rankin Score: No symptoms Modified Rankin: Severe disability     Balance Overall balance assessment: Needs assistance Sitting-balance support: No upper extremity supported;Feet supported Sitting balance-Leahy Scale: Poor Sitting balance - Comments: Not tested this session Postural control: Posterior lean;Left lateral lean     Standing balance comment: unable                            Cognition Arousal/Alertness: Awake/alert Behavior During Therapy: Flat affect Overall Cognitive Status: Impaired/Different from baseline Area of Impairment: Following commands;Problem solving                       Following Commands:  (Questionable whether pt following commands today vs seeing tone in UE's.)     Problem Solving: Slow processing;Decreased initiation;Requires verbal cues;Requires tactile cues        Exercises Other Exercises Other Exercises: In sitting, sustained L cervical rotation stretch and l scalene stretch 3x30" each. Dynamic stretch of bilateral levator scapulae. Difficulty with upper  trap stretch on the R due to limited movement.    General Comments        Pertinent Vitals/Pain Pain Assessment: Faces Faces Pain Scale: Hurts little more Pain Location: Furrowing of brows with some cervical stretching. Pain Descriptors / Indicators: Guarding Pain Intervention(s): Limited activity within patient's tolerance;Monitored during session;Repositioned    Home Living                       Prior Function            PT Goals (current goals can now be found in the care plan section) Acute Rehab PT Goals Patient Stated Goal: Pt unable to state PT Goal Formulation: Patient unable to participate in goal setting Time For Goal Achievement: 11/30/20 Potential to Achieve Goals: Poor Additional Goals Additional Goal #1: Provide positioning, mobility, and transfer education to family and have family participate in these activities during PT Progress towards PT goals: Progressing toward goals    Frequency    Min 2X/week      PT Plan Current plan remains appropriate    Co-evaluation PT/OT/SLP Co-Evaluation/Treatment: Yes Reason for Co-Treatment: Complexity of the patient's impairments (multi-system involvement);For patient/therapist safety;Necessary to address cognition/behavior during functional activity PT goals addressed during session: Mobility/safety with mobility;Balance;Strengthening/ROM OT goals addressed during session: Strengthening/ROM      AM-PAC PT "6 Clicks" Mobility   Outcome Measure  Help needed turning from your back to your side while in a flat bed without using bedrails?: Total Help needed moving from lying on your back to sitting on the side of a flat bed without using bedrails?: Total Help needed moving to and from a bed to a chair (including a wheelchair)?: Total Help needed standing up from a chair using your arms (e.g., wheelchair or bedside chair)?: Total Help needed to walk in hospital room?: Total Help needed climbing 3-5 steps with a railing? : Total 6 Click Score: 6    End of Session Equipment Utilized During Treatment: Gait belt Activity Tolerance: Patient tolerated treatment well Patient left: in chair;with chair alarm set;with nursing/sitter in room;with call bell/phone within reach Nurse Communication: Mobility status;Need for lift equipment;Precautions (1-2 hours maximum to sit in the chair this session.) PT Visit Diagnosis:  Hemiplegia and hemiparesis;Other symptoms and signs involving the nervous system (R29.898);Other abnormalities of gait and mobility (R26.89) Hemiplegia - Right/Left: Right Hemiplegia - dominant/non-dominant: Dominant Hemiplegia - caused by: Cerebral infarction;Nontraumatic intracerebral hemorrhage;Other Nontraumatic intracranial hemorrhage     Time: 0802-0847 PT Time Calculation (min) (ACUTE ONLY): 45 min  Charges:  $Therapeutic Activity: 23-37 mins                     Conni Slipper, PT, DPT Acute Rehabilitation Services Pager: [phone] Office: [phone]    Charlene [name] 11/16/2020, 9:26 AM

## 2020-11-16 NOTE — Plan of Care (Signed)
  Problem: Coping: Goal: Will identify appropriate support needs Outcome: Progressing   Problem: Intracerebral Hemorrhage Tissue Perfusion: Goal: Complications of Intracerebral Hemorrhage will be minimized Outcome: Progressing   Problem: Ischemic Stroke/TIA Tissue Perfusion: Goal: Complications of ischemic stroke/TIA will be minimized Outcome: Progressing   Problem: Clinical Measurements: Goal: Ability to maintain clinical measurements within normal limits will improve Outcome: Progressing Goal: Will remain free from infection Outcome: Progressing Goal: Diagnostic test results will improve Outcome: Progressing Goal: Respiratory complications will improve Outcome: Progressing Goal: Cardiovascular complication will be avoided Outcome: Progressing

## 2020-11-16 NOTE — Progress Notes (Addendum)
[facility] PROGRESS NOTE  Charlene [name] JAS:[id number] DOB: 20-Apr-1966 DOA: 08/26/2020 PCP: Patient, [name] (Inactive)    October 08, 2020     10/16/2020                        10/16/2020     10/18/2020                        10/20/2020    Status: Remains inpatient appropriate because:Altered mental status, Unsafe d/c plan, IV treatments appropriate due to intensity of illness or inability to take PO and Inpatient level of care appropriate due to severity of illness   Dispo: The patient is from: Home              Anticipated d/c is to: SNF vs brain injury recovery rehab-family does not wish to place patient in SNF              Patient currently is medically stable to d/c.   Difficult to place patient Yes   Level of care: Progressive  Code Status: Full Family Communication:  extensive conversation via telephone with [name] [phone]) at bedside on 4/1.; [name] [phone]) is her POA and legal contact.  4/21 husband at bedside DVT prophylaxis: enoxaparin (LOVENOX) injection 40 mg Start: 09/01/20 1600 SCD's Start: 08/27/20 0026   Vaccination status: Unvaccinated  Micro Data:  2/9 COVID + Influenza negative 2/10 MRSA PCR negative 2/12 BCx 2 > no growth  2/19 BC 1/2 +Stap EPI, 1/2 no growth  10/07/2020 sputum positive for Serratia marcescens 4/8 Enterobacter cloaca + urine culture MDR sensitive only to imipenem and gentamicin   Antimicrobials:  Cefepime 2/12 > 2/15 Vanc 2/12 > 2/15 10/08/2020 ciprofloxacin>>3/30 Unasyn 3/30 >> 4/02 Rocephin 4/11 > 4/12 Meropenem 4/12>4/17   HPI: 55 yo F with 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  Subsequently on 2/10, she developed obtundation and respiratory arrest leading to intubation.  She has remained persistently obtunded, now has a trach and a PEG.  Hospital course complicated by persistent fever, Serratia pneumonia and  now pre-orbital cellulitis  As of 3/25 with adjustments in hypertonicity medications patient began to improve regarding hypertonicity.  Eventually started on provigil with improvements in alertness.  Evaluated by rehab physician who recommended increasing provigil dose further.  Subsequently patient has become more alert.  Most days she is able to track and with encouragement able to follow simple commands especially since partial obstruction glasses placed.  See progress note dictated 4/27 for expanded details.   2/09 Admit for [place] 2/10 EVD placed, later intubated due to obtundation and respiratory arrest 2/11 MRI Brain, hypertonic saline d/c'd, EVD not working.  Husband did not consent to PICC  2/13 EVD removed 2/17 [place] discussion with family, PMT 2/24 Bedside trach and PEG 3/1 Transferred to [facility]  Subjective: Alert and sitting up in chair upon my arrival to room.  Tracking mother in room and purposefully reaching for mother's hand.  Not following commands for me and intermittent tracking of this Probation officer.  Objective: Vitals:   11/16/20 1129 11/16/20 1457  BP: (!) 124/93   Pulse: 88 84  Resp: 20 18  Temp:    SpO2: 100% 100%   No intake or output data in the 24 hours ending 11/16/20 1457 Filed Weights   11/14/20 0453 11/15/20 0500 11/16/20 0500  Weight: 72 kg 73.2 kg 72.9  kg    Exam:  Constitutional: Awake and alert, appears to be in no acute distress. Respiratory: #6.0 cuffless trach, trach collar with FiO2 21% at 5L,O2 sats 97 to 99%.  Anterior lung sounds are clear to auscultation.  Normal respiratory effort Cardiovascular: Heart sounds are normal.  Regular pulse without tachycardia.  Normotensive. Abdomen:  PEG tube-LBM 4/30, abdomen remarkable with normoactive bowel sounds.  Tolerating tube feedings. Neurologic: CN 2-12 intact except notable with left ocular disconjugate gaze with medial right downward gaze. Lower extremity foot splints in place.  Able to track with right eye.  Patient has spontaneous purposeful movement that is gross motor in nature of right upper extremity.  Neck somewhat more stiff today with slight leftward turn.  Assisted mother with placing towel roll to improve head positioning Psych: Unable to accurately assess due to nonverbal state   Assessment/Plan: Acute problems: Multiple acute strokes  Right Thalamus ICH with IVH  Obstructive Hydrocephalus/Acute on persistent metabolic Encephalopathy/brain injury related hypertonicity -Continue baclofen -repeat LFTs 5/1 unremarkable -Continue Provigil 200 mg daily  -Continue scheduled Senokot for constipation prophylaxis -Continue OOB to chair daily and PROM every 4 hours per nursing staff, OT, PT, SLP -5/2, CIR MD reevaluated patient " Mrs. Shively unfortunately has not made any meaningful functional gains over the last month. She remains most appropriate for SNF level care".   Neck Contracture/torticollis -Continue baclofen as above -- continue PT/OT   Hypertensive emergency/Malignant HTN -Had been prescribed carvedilol and hydrochlorothiazide prior to admission but was unable to afford -Continue Norvasc, Lopressor and hydralazine.   Acute hypoxemic respiratory failure secondary to CVA requiring mechanical ventilation and tracheostomy tube -Stable, #6 cuffless trach with collar -Per trach team: Has significant altered mentation and inability to manage oral secretions at present time is not a safe candidate for decannulation.  Discussed this with family as major obstacle in discharging to home.  This will also decrease options for SNF placement.  Dysphagia -Goal is to prevent malnutrition.  Patient remained stable on tube feedings per PEG along with Prosource liquid and free water to maintain adequate hydration.  Also will continue to follow CBGs and provide SSI if indicated     Other problems: Right upper extremity thrombophlebitis - [name] -> age indeterminate SVT of right cephalic  vein  ESBL positive Enterobacter UTI -Has completed 5 days of meropenem -Contact isolation during hospitalization -continue Oxybutynin suspected bladder spasms  Bacterial Conjunctivitis /periorbital cellulitis -Resolved  Chronic pansinusitis/recurrent fevers 2/2 aspiration PNA;History of Serratia Pneumonia  Hospital Acquired Pneumonia:  -Completed several courses of antibiotic -Follow-up imaging including sinus CT consistent with chronic but stable and resolving sinusitis   Anemia -Stable  HLD -Continue Lipitor      Data Reviewed: Basic Metabolic Panel: Recent Labs  Lab 11/15/20 0150  NA 139  K 3.8  CL 99  CO2 30  GLUCOSE 103*  BUN 16  CREATININE 0.50  CALCIUM 9.3   Liver Function Tests: Recent Labs  Lab 11/15/20 0150  AST 25  ALT 41  ALKPHOS 140*  BILITOT 0.3  PROT 6.9  ALBUMIN 2.7*   CBC: Recent Labs  Lab 11/15/20 0150  WBC 5.7  HGB 10.1*  HCT 32.0*  MCV 80.0  PLT 390    CBG: Recent Labs  Lab 11/15/20 2028 11/16/20 0101 11/16/20 0433 11/16/20 0929 11/16/20 1256  GLUCAP 137* 135* 123* 126* 139*     Studies: No results found.  Scheduled Meds: . amLODipine  10 mg Per Tube Daily  . atorvastatin  40 mg Per Tube Daily  . baclofen  5 mg Per Tube 2 times per day  . baclofen  7.5 mg Per Tube Daily  . chlorhexidine gluconate (MEDLINE KIT)  15 mL Mouth Rinse BID  . Chlorhexidine Gluconate Cloth  6 each Topical Daily  . enoxaparin (LOVENOX) injection  40 mg Subcutaneous Q24H  . feeding supplement (PROSource TF)  45 mL Per Tube Daily  . free water  100 mL Per Tube Q4H  . hydrALAZINE  50 mg Per Tube Q8H  . insulin aspart  0-9 Units Subcutaneous Q4H  . mouth rinse  15 mL Mouth Rinse 5 X Daily  . metoprolol tartrate  100 mg Per Tube BID  . modafinil  200 mg Per Tube Daily  . oxybutynin  5 mg Per Tube BID  . pantoprazole sodium  40 mg Per Tube BID  . sennosides  5 mL Per Tube QHS   Continuous Infusions: . sodium chloride Stopped  (10/26/20 1445)  . feeding supplement (OSMOLITE 1.2 CAL) 1,000 mL (11/15/20 1544)    Principal Problem:   ICH (intracerebral hemorrhage) ([place]) Active Problems:   Intraparenchymal hemorrhage of brain (HCC)   Acute hypoxemic respiratory failure (HCC)   Fever   Endotracheal tube present   Status post tracheostomy ([place])   Palliative care by specialist   Muscle hypertonicity   Oropharyngeal dysphagia   Brain injury with loss of consciousness ([place])   Poor prognosis   Aspiration pneumonia of right lower lobe (HCC)   Gram-negative infection   Abnormal urinalysis   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   Acute cystitis without hematuria   Consultants:  Neurology no follow up specified. We'll call at the time of discharge.  Neurosurgery.  No follow up specified. We'll Call at the time of discharge.  Palliative care.  No follow up specified. Pt will benefit from outpatient palliative medicine consult.  PCCM continues to follow.  No decannulation.  Outpatient follow-up with trach clinic will be needed.   Procedures:  Tracheostomy  PEG tube  EEG  Echocardiogram  Antibiotics: Vancomycin 2/12-2/15 Maxipime 2/12-2/15 Ancef 2/19-2/27 Vancomycin 2/20 x 1 dose Maxipime 2/27- 3/5 Vancomycin 3/1-3/3 Ancef 3/6-3/14 Cipro 3/20 4-3/30 Unasyn 3/30-4/4 Rocephin 4/11-4/12 Meropenem 4/12-4/17   Time spent: 25 minutes    Erin Hearing ANP  [facility] 7 am - 330 pm/M-F for direct patient care and secure chat Please refer to [place] for contact info 81  Days

## 2020-11-16 NOTE — Progress Notes (Signed)
NAME:  Charlene [name], MRN:  [id number], DOB:  20-Mar-1966, LOS: 81 ADMISSION DATE:  08/26/2020, CONSULTATION DATE:  2/10 REFERRING MD:  Dr. Roda Shutters, CHIEF COMPLAINT:  ICH   Brief History:  54 year old female admitted with hypertensive R thalamic/basal ganglia ICH. Resultant obstructive hydrocephalus prompting EVD placement 2/10. Subsequently, 2/10 she had acute mental status change to obtundation and respiratory arrest resulting in intubation. Trach and Peg 2/24. Trach change to #6 cuffless on 3/11.   Past Medical History:  HTN, medication noncompliance  Significant Hospital Events:  2/09 Admit for ICH 2/10 EVD placed, later intubated due to obtundation and respiratory arrest 2/11 MRI Brain, hypertonic saline d/c'd, EVD not working.  Husband did not consent to PICC  2/13 EVD removed 2/17 [place] discussion with family, PMT 3/11 trach changed to [place] 6 uncuffed 4/18 28% ATC / 5L   Consults:  Stroke PCCM Neurosurgery  Procedures:  ETT 2/10 >> 09/10/2020 EVD 2/10 > non functioning 2/11 at 1500; clamped 2/12 am > 2/13 Trach (Dr. Denese Killings) 2/24 >>  PEG placement (Dr. Sheliah Hatch) 2/24 >>   Significant Diagnostic Tests:  CT Head 2/9 >> Acute hemorrhage with the epicenter in the right thalamus. Measurement is approximately 3.2 x 2.1 x 2.5 cm (volume 8.8 cm^3). Intraventricular penetration with blood filling the third and fourth ventricles and nearly filling the right lateral ventricle. No hydrocephalus. CTA Head/Neck 2/10 >> Unchanged size of intraparenchymal hemorrhage centered in the right thalamus and basal ganglia with extension into the right lateral ventricle. 2. No intracranial arterial occlusion or high-grade stenosis. No dissection, aneurysm or hemodynamically significant stenosis of the carotid or vertebral arteries. CT Head 2/10 >> Stable parenchymal hemorrhage in the right thalamus and basal ganglia with intraventricular extension as described. No new focal abnormality is noted. CT  Head 2/10 (post intubation) >> redemonstrated intraparenchymal hemorrhage centered within the R thalamus/basal ganglia extending into the cerebral peduncle/midbrain with associated intraventricular extension and hemorrhage in the R lateral ventricle and fourth ventricle, overall not significantly changed from prior; stable mass effect, 32mm of R to L midline shift, postsurgical changes of ventriculostomy TTE 2/10 >> LVEF 60 to 65%, LV has normal function, no regional wall motion abnormalities, mild concentric LVH.  RV systolic function is normal, normal pulmonary artery systolic pressure.  MRA head 2/11 >>Negative intracranial MR angiography. No evidence of aneurysm or high flow vascular malformation to explain the intraparenchymal hemorrhage  MRI brain 2/11 >> Similar intraparenchymal hemorrhage centered within the right thalamus and basal ganglia extending into the right midbrain with intraventricular extension, effacement of the prepontine cistern and similar 4 mm of leftward midline shift; separate areas of restricted diffusion and edema involving the R > L mesial temporal lobes/parahippocampal cortex, bilateral basal ganglia, and possibly the upper pons that are c/f acute infarcts, most likely secondary to mass effect from hemorrhage. MRI brain 2/19 >> intraventricular clot has decreased in size but mid-brain parenchymal clot has not and there are more ischemic infarcts in the surrounding brain.  CT Head WO Contrast 09/07/20>> Expected evolution of intracranial hemorrhage since 08/30/2020 head CT. Multiple areas of low attenuation corresponding to some of the infarcts seen on prior MRI. Ventricle caliber similar to prior MRI   Micro Data:  2/9 COVID + Influenza negative 2/10 MRSA PCR negative 2/12 BCx 2 > no growth  2/19 BC 1/2 +Stap EPI, 1/2 no growth  10/07/2020 sputum positive for Serratia marcescens 4/8 UC >> enterobacter cloacae >> S-gent, imipenem. Otherwise resistant.   Antimicrobials:   Cefepime  2/12 > 2/15 Vanc 2/12 > 2/15 Ciprofloxacin 3/24 >> 3/30   Interim History / Subjective:  Sure looks the same.  Currently up in a chair.  No distress.  Objective   Blood pressure 121/75, pulse 80, temperature 98 F (36.7 C), temperature source Axillary, resp. rate 16, height 5\' 5"  (1.651 m), weight 72.9 kg, last menstrual period 03/03/2014, SpO2 100 %, unknown if currently breastfeeding.    FiO2 (%):  [21 %] 21 %  No intake or output data in the 24 hours ending 11/16/20 0826 Filed Weights   11/14/20 0453 11/15/20 0500 11/16/20 0500  Weight: 72 kg 73.2 kg 72.9 kg   Exam:  General this is a now chronically ill 55 year old female who remains tracheostomy dependent status post prolonged critical illness after suffering intracerebral hemorrhage complicated by obstructive hydrocephalus HEENT normocephalic, right-sided gaze preference.  Does continue to have mild neck contracture.  Tracheostomy stoma unremarkable had a size 6 cuffless tracheostomy Pulmonary: Some scattered rhonchi no accessory use currently 28% humidified trach collar Cardiac: Regular rate and rhythm Abdomen: Soft nontender PEG tube in place Extremities: Contracted Neuro: Right-sided gaze preference contracted does not follow commands   Resolved Hospital Problem list   Hypernatremia  Obstructive hydrocephalus (resolved) Hypertensive emergency Acute hypoxemic respiratory failure secondary to CVA Serratia PNA  Bacterial Conjunctivitis   Assessment & Plan:   Tracheostomy Dependent s/p Multiple Acute Strokes c/b Intraparenchymal Hemorrhage with Right Thalamic Hemorrhage with extension into the third fourth and right lateral ventricle Torticollis/neck contracture  Dysphagia s/p PEG Anemia Sepsis 2/2 Enterobacter Urinary tract infection (new 4/8)  Pulmonary problem list:  Tracheostomy Dependence post CVA   Discussion Looking good from trach standpoint. Unfortunately her mental status continues to prevent  her from being a candidate for decannulation, I doubt she will ever be a candidate for removing trach  Plan Cont routine trach care We will cont to see weekly.  Will need f/u at trach clinic s/p dc   6/8 ACNP-BC [facility] Pulmonary/Critical Care Pager # [phone] OR # [phone] if no answer

## 2020-11-16 NOTE — Evaluation (Signed)
Speech Language Pathology Evaluation Patient Details Name: Charlene [name] MRN: [id number] DOB: [birth year] Today's Date: 11/16/2020 Time: 0175-1025 SLP Time Calculation (min) (ACUTE ONLY): 19 min  Problem List:  Patient Active Problem List   Diagnosis Date Noted  . Acute cystitis without hematuria   . 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 Gram-negative infection   . Abnormal urinalysis   . Aspiration pneumonia of right lower lobe (HCC)   . Poor prognosis 10/08/2020  . Muscle hypertonicity   . Oropharyngeal dysphagia   . Brain injury with loss of consciousness (HCC)   . Palliative care by specialist   . Status post tracheostomy (HCC)   . Endotracheal tube present   . Fever   . ICH (intracerebral hemorrhage) (HCC) 08/27/2020  . Intraparenchymal hemorrhage of brain (HCC)   . Acute hypoxemic respiratory failure (HCC)   . Anxiety state, unspecified 03/11/2014  . Essential hypertension 03/11/2014  . Essential hypertension, benign 02/28/2014  . Accelerated hypertension 02/21/2014  . Nausea & vomiting 02/21/2014  . Headache 02/21/2014  . Chest pain 02/21/2014   Past Medical History:  Past Medical History:  Diagnosis Date  . Hypertension    Past Surgical History:  Past Surgical History:  Procedure Laterality Date  . CESAREAN SECTION    . ESOPHAGOGASTRODUODENOSCOPY N/A 09/10/2020   Procedure: ESOPHAGOGASTRODUODENOSCOPY (EGD);  Surgeon: Sheliah Hatch, De Blanch, MD;  Location: [facility] ENDOSCOPY;  Service: General;  Laterality: N/A;  . PEG PLACEMENT N/A 09/10/2020   Procedure: PERCUTANEOUS ENDOSCOPIC GASTROSTOMY (PEG) PLACEMENT;  Surgeon: Sheliah Hatch De Blanch, MD;  Location: [facility] ENDOSCOPY;  Service: General;  Laterality: N/A;   HPI:  Pt is 55 y/o female presents to [facility] on 2/9 with L sided weakness and headaches. PMH includes anxiety, HTN. CT on 2/10 shows right thalamic ICH extending into the intraventricular third and fourth  ventricles and nearly filling the right lateral ventricle with no overt sign of hydrocephalus. s/p Right frontal ventricular catheter placement on 2/10, stopped functioning on 2/11.  MRI 2/11 shows right greater than left mesial temporal lobes / parahippocampal cortex, bilateral basal ganglia, and possibly the upper pons that are concerning for acute infarcts, most likely secondary to mass effect from the hemorrhage. ETT 2/10; trach/PEG 09/10/20.  Trach changed to #6 cuffless 3/11. Hospital course complicated by persistent fever,  Serratia pneumonia and pre-orbital cellulitis.   Assessment / Plan / Recommendation Clinical Impression  Pt presents with severe expressive and receptive language deficits in setting of recent CVA with complicated medical course. Pt was assessed informally as level of impairments prohibit standardized assessment. Today pt exhibited some spontaneous phonation (humming) with PMV in place, but was unable to phonate on command.  Pt did not attempt to speak with pt or family (mother) despite maximum encouragement.  Pt did not benefit from verbal, visual, tactile, or melody. Pt was more alert earlier this morning, and had been sitting up in chair for 3+ hours and fatigue may have played a roll Pt followed 1 step directions with 40% accuracy.  Pt had greater success with directions for limb movement (finger squeeze, hand movement) than other items.  Pt would benefit from further assessment of receptive language ability, especially if reliable response structure could be implemented (yes/no).   Pt would benefit from ST at current level of care to address expressive and receptive language deficits, with ongoing assessment of cognitive-linguistic abilities as language improves.  Continue ST at next level of care.     SLP Assessment  SLP Recommendation/Assessment: Patient needs continued Speech Lanaguage Pathology Services SLP Visit Diagnosis: Dysphagia, unspecified (R13.10)    Follow Up  Recommendations  Skilled Nursing facility    Frequency and Duration min 3x week  2 weeks      SLP Evaluation Cognition  Overall Cognitive Status: Impaired/Different from baseline       Comprehension  Auditory Comprehension Overall Auditory Comprehension: Impaired Commands: Impaired One Step Basic Commands: 25-49% accurate Reading Comprehension Reading Status: Not tested    Expression Verbal Expression Overall Verbal Expression: Impaired Initiation: Impaired Automatic Speech: Name;Counting;Singing (Attempted;Unable) Written Expression Dominant Hand: Right Written Expression: Not tested   Oral / Motor  Oral Motor/Sensory Function Overall Oral Motor/Sensory Function: Generalized oral weakness (Unable to assess) Motor Speech Overall Motor Speech:  (Unable to assess)   GO                    Kerrie Pleasure, MA, CCC-SLP Acute Rehabilitation Services Office: [phone] 11/16/2020, 12:31 PM

## 2020-11-16 NOTE — Progress Notes (Addendum)
CSW spoke with patient's husband [name] to discuss the [employer] RN visit last week - [name] reports the visit went well and that the RN states there are no beds available in [place] but there are some in a facility one hour away.   CSW attempted to reach [employer] - no answer, a voicemail was left requesting a return call.  Edwin Dada, MSW, LCSW Transitions of Care  Clinical Social Worker II [phone]

## 2020-11-16 NOTE — Progress Notes (Signed)
  Speech Language Pathology Treatment: Dysphagia;Passy Muir Speaking valve  Patient Details Name: Charlene [name] MRN: [id number] DOB: Jul 07, 1966 Today's Date: 11/16/2020 Time: 1203-1210 SLP Time Calculation (min) (ACUTE ONLY): 7 min  Assessment / Plan / Recommendation Clinical Impression  SWALLOWING Pt ween for ongoing dysphagia management with PMSV in place.  Pt seated upright in chair Pt did not accept bolus trials this date.  Thermal stimulation applied to lips with labial puckering and twitching noted, but pt would not accept ice chips with maximal encouragement.  Pt's lips remain swollen, especially on R.  Pt allowed application of ice chips to lips, but there was no tongue protrusion to lick water as ice chip melted, or jaw opening to accept bolus trial.  SLP to continue PO trials as appropriate to monitor readiness for instrumental swallow assessment or diet initiation.  PMSV Pt tolerated placement of PMSV for 25 minutes with no change in vital signs. Placement of PMV did not produce cough reflex.  Site clean and dry and no wet vocal quality or breath sounds were noted with PMV placement.  Pt did not attempt to phonate to command.  There was some spontaneous phonation noted which consisted of quiet humming.  Mother reports that pt has been able to phonate around her tracheostomy tube without PMV in place, but leak speech/phonation was not observed during this session.  Provided education to mother [name] regarding purpose and function of PMV.  Mother was trained in donning and doffing and demonstrated ability to place and remove PMSV.  She articulated 2 of 4 reasons to remove valve independently, increasing to 4 of 4 with verbal cuing.  PMV sign provided, including reasons to remove PMV on sign.  Pt may wear PMV with staff with direct, 1:1 supervision.  Pt may also wear PMV with trainied caregiver (mother) with 1:1 supervision.  SLP will continue to follow for PMV tolerance and vocal  retraining.    HPI HPI: Pt is 55 y/o female presents to [facility] on 2/9 with L sided weakness and headaches. PMH includes anxiety, HTN. 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 s/p Right frontal ventricular catheter placement on 2/10, stopped functioning on 2/11.  MRI 2/11 shows right greater than left mesial temporal lobes / parahippocampal cortex, bilateral basal ganglia, and possibly the upper pons that are concerning for acute infarcts, most likely secondary to mass effect from the hemorrhage. ETT 2/10; trach/PEG 09/10/20.  Trach changed to #6 cuffless 3/11. Hospital course complicated by persistent fever,  Serratia pneumonia and pre-orbital cellulitis.      SLP Plan  Continue with current plan of care  Patient needs continued Speech Lanaguage Pathology Services    Recommendations  Medication Administration: Via alternative means      Patient may use Passy-Muir Speech Valve: Intermittently with supervision;Caregiver trained to provide supervision;During all therapies with supervision PMSV Supervision: Full MD: Please consider changing trach tube to : Smaller size         Oral Care Recommendations: Oral care QID Follow up Recommendations: Skilled Nursing facility SLP Visit Diagnosis: Dysphagia, unspecified (R13.10);[place] (R49.1) Plan: Continue with current plan of care       GO                Kerrie Pleasure, MA, CCC-SLP Acute Rehabilitation Services Office: [phone] 11/16/2020, 12:40 PM

## 2020-11-16 NOTE — Progress Notes (Signed)
Nutrition Follow-up  DOCUMENTATION CODES:  Not applicable  INTERVENTION:  Continue Tube feeding via PEG tube: Osmolite 1.2 at 55 ml/h (1320 ml per day) 45 ml ProSource TF daily  Provides 1624 kcal, 84 gm protein, 1070 ml free water daily  100 ml free water every 4 hours    NUTRITION DIAGNOSIS:  Inadequate oral intake related to inability to eat as evidenced by NPO status. -- ongoing  GOAL:  Patient will meet greater than or equal to 90% of their needs -- Met with TF  MONITOR:  TF tolerance,Vent status  REASON FOR ASSESSMENT:  Consult,Ventilator Enteral/tube feeding initiation and management  ASSESSMENT:  Pt with PMH of HTN with medication noncompliance admitted with hypertensive thalamic ICH.  2/10 s/p EVD placement and intubation for respiratory arrest 2/13 EVD removed 2/17 palliative care consult; family requesting tx to [facility]  2/24 s/p trach and PEG  3/1 tx to [facility] 3/11 trach changed to [place] #6 cuffless 4/08 pt w/ sepsis 2/2 UTI 4/18 28% ATC/5L  Pt alert and is noted to be having some improvement in ability to track/follow commands. Pt was re-evaluated for CIR as family does not want pt to go to SNF; however, pt is still not a candidate.   Pt continues to tolerate TF via PEG. Current regimen: Osmolite 1.2 at 55 ml/h w/ 45 ml ProSource TF daily, 160m free water flushes Q4H. Tolerating well per RN.   No UOP documented x24 hours   Pt with generalized non-pitting edema per RN assessment.   Admit wt: 61.2 kg Current wt: 72.9 kg   Medications: SSI Q4H, protonix, senokot Labs reviewed. CBGs 126-139-120  Diet Order:   Diet Order            Diet NPO time specified  Diet effective now                 EDUCATION NEEDS:   No education needs have been identified at this time  Skin:  Skin Assessment: Reviewed RN Assessment  Last BM:  4/30  Height:   Ht Readings from Last 1 Encounters:  10/18/20 5' 5" (1.651 m)    Weight:   Wt Readings from  Last 1 Encounters:  11/16/20 72.9 kg    Ideal Body Weight:  56.8 kg  BMI:  Body mass index is 26.74 kg/m.  Estimated Nutritional Needs:   Kcal:  1600-1800  Protein:  80-90 grams  Fluid:  >1.6L/d    ALarkin Ina MS, RD, LDN RD pager number and weekend/on-call pager number located in [place]

## 2020-11-16 NOTE — Progress Notes (Signed)
Occupational Therapy Treatment Patient Details Name: Charlene [name] MRN: [id number] DOB: August 03, 1965 Today's Date: 11/16/2020    History of present illness Pt is 55 y/o female presents to [facility] on 2/9 with L sided weakness and headaches. PMH includes anxiety, HTN. 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 s/p Right frontal ventricular catheter placement on 2/10, stopped functioning on 2/11.  MRI 2/11 shows right greater than left mesial temporal lobes / parahippocampal cortex, bilateral basal ganglia, and possibly the upper pons that are concerning for acute infarcts, most likely secondary to mass effect from the hemorrhage. ETT 2/10-present.  Pt with trach and PEG on 09/10/20. Hospital course complicated by persistent fever, Serratia pneumonia and now pre-orbital cellulitis.   OT comments  Pt seen with PT today for OOB tolerance (RN made aware not up in recliner more than 2 hours and soft splints need to go on at 10:00am), neck rotation while in recliner, trunk rotation while in recliner, to check on extension at left elbow since initiating soft splints (it is better), right knee extension since initiating soft splint (not better), and vision (no tracking today. She will continue to benefit from acute OT with follow at SNF.  Follow Up Recommendations  SNF;Supervision/Assistance - 24 hour    Equipment Recommendations  None recommended by OT       Precautions / Restrictions Precautions Precautions: Fall Precaution Comments: peg and trach Required Braces or Orthoses: Other Brace Other Brace: RLE sock knee brace and LUE soft elbow brace (wearing schedule in RN orders) Restrictions Weight Bearing Restrictions: No       Mobility Bed Mobility Overal bed mobility: Needs Assistance   Rolling: Total assist         General bed mobility comments: Grossly  total assist for rolling. 1 time tone kicked in and assisted roll to the R however unintentional.    Transfers Overall transfer level: Needs assistance                    Balance Overall balance assessment: Needs assistance Sitting-balance support: No upper extremity supported;Feet supported Sitting balance-Leahy Scale: Poor Sitting balance - Comments: Not tested this session Postural control: Posterior lean;Left lateral lean     Standing balance comment: unable                           ADL either performed or assessed with clinical judgement   ADL                                         General ADL Comments: pt requires total A     Vision   Additional Comments: Pt with occulsion lenses on 1/4 of session (did not have them on rolling in bed and when working on neck ROM). Upon up in maxi move sling pt with nystagmus with left eye with stronger beat than right eye. Pt not tracking today, tended to keep eyes mostly to right with occassional shift towards midline.          Cognition Arousal/Alertness: Awake/alert Behavior During Therapy: Flat affect Overall Cognitive Status: Impaired/Different from baseline Area of Impairment: Following commands;Problem solving  Current Attention Level: Focused   Following Commands:  (Questionable whether pt following commands today vs seeing tone in UE's.)     Problem Solving: Slow processing;Decreased initiation;Requires verbal cues;Requires tactile cues          Exercises Exercises: Other exercises Other Exercises Other Exercises: Pt's RLE and LUE soft splints were not on when we entered room (and they should not have been, they should go on at 10:00--pt her day RN aware). Upon first entering pt's I checked range on pt's left elbow for extension and she was ~-10 degrees from full extension (happy at these results). Pt tends to keep RUE in extension while supine, once we got  her in the recliner via Maxi move and PT started working on neck ROM with her, her right elbow relaxed and we were able to get good flexon (90 degrees) at right elbow and maintain this most of session. Worked on PROM for trunk rotation in recliner with arms in "rocking baby" position--pt tolerated this well.           Pertinent Vitals/ Pain       Pain Assessment: Faces Faces Pain Scale: Hurts little more Pain Location: Furrowing of brows with some cervical stretching. Pain Descriptors / Indicators: Guarding Pain Intervention(s): Limited activity within patient's tolerance;Monitored during session;Repositioned         Frequency  Min 2X/week        Progress Toward Goals  OT Goals(current goals can now be found in the care plan section)  Progress towards OT goals: Progressing toward goals (slowly)  Acute Rehab OT Goals Patient Stated Goal: Pt unable to state  Plan Discharge plan remains appropriate    Co-evaluation    PT/OT/SLP Co-Evaluation/Treatment: Yes Reason for Co-Treatment: Complexity of the patient's impairments (multi-system involvement);For patient/therapist safety;Necessary to address cognition/behavior during functional activity PT goals addressed during session: Mobility/safety with mobility;Balance;Strengthening/ROM OT goals addressed during session: Strengthening/ROM      AM-PAC OT "6 Clicks" Daily Activity     Outcome Measure   Help from another person eating meals?: Total Help from another person taking care of personal grooming?: Total Help from another person toileting, which includes using toliet, bedpan, or urinal?: Total Help from another person bathing (including washing, rinsing, drying)?: Total Help from another person to put on and taking off regular upper body clothing?: Total Help from another person to put on and taking off regular lower body clothing?: Total 6 Click Score: 6    End of Session Equipment Utilized During Treatment: Oxygen  (trach collar for humidification)  OT Visit Diagnosis: Muscle weakness (generalized) (M62.81);Apraxia (R48.2);Cognitive communication deficit (R41.841);Hemiplegia and hemiparesis Symptoms and signs involving cognitive functions: Other Nontraumatic ICH Hemiplegia - Right/Left:  (Bil) Hemiplegia - caused by: Other Nontraumatic intracranial hemorrhage   Activity Tolerance Patient tolerated treatment well   Patient Left in chair;with call bell/phone within reach;with chair alarm set   Nurse Communication Mobility status (why we had roll behind her neck and "head" pillow under her feet with clean socks on)        Time: 0998-3382 OT Time Calculation (min): 45 min  Charges: OT General Charges $OT Visit: 1 Visit OT Treatments $Therapeutic Activity: 8-22 mins  Ignacia Palma, OTR/L Acute [employer] Pager [phone] Office [phone]      Evette Georges 11/16/2020, 10:08 AM

## 2020-11-17 LAB — GLUCOSE, CAPILLARY
Glucose-Capillary: 138 mg/dL — ABNORMAL HIGH (ref 70–99)
Glucose-Capillary: 147 mg/dL — ABNORMAL HIGH (ref 70–99)
Glucose-Capillary: 122 mg/dL — ABNORMAL HIGH (ref 70–99)
Glucose-Capillary: 97 mg/dL (ref 70–99)
Glucose-Capillary: 128 mg/dL — ABNORMAL HIGH (ref 70–99)

## 2020-11-17 MED ORDER — BACLOFEN 1 MG/ML ORAL SUSPENSION
7.5000 mg | ORAL | Status: DC
  Administered 2020-11-17 – 2020-12-16 (×59): 7.5 mg
  Filled 2020-11-17 (×66): qty 7.5

## 2020-11-17 NOTE — NC FL2 (Signed)
[place] LEVEL OF CARE SCREENING TOOL     IDENTIFICATION  Patient Name: Charlene [name] Birthdate: 07/24/1965 Sex: female Admission Date (Current Location): 08/26/2020  [facility] and [place] Number:  [place] Medicaid potential - pending Facility and Address:  [facility] [place], [place] [address] [place], [place] [address]      Provider Number: [id number]  Attending Physician Name and Address:  Caren Griffins, MD  Relative Name and Phone Number:  [name], husband - [phone]    Current Level of Care: Hospital Recommended Level of Care: [place] Prior Approval Number:    Date Approved/Denied:   PASRR Number: [id number]  Discharge Plan: SNF    Current Diagnoses: Patient Active Problem List   Diagnosis Date Noted  . Acute cystitis without hematuria   . 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 Gram-negative infection   . Abnormal urinalysis   . Aspiration pneumonia of right lower lobe ([place])   . Poor prognosis 10/08/2020  . Muscle hypertonicity   . Oropharyngeal dysphagia   . Brain injury with loss of consciousness ([place])   . Palliative care by specialist   . Status post tracheostomy ([place])   . Endotracheal tube present   . Fever   . ICH (intracerebral hemorrhage) ([place]) 08/27/2020  . Intraparenchymal hemorrhage of brain ([place])   . Acute hypoxemic respiratory failure ([place])   . Anxiety state, unspecified 03/11/2014  . Essential hypertension 03/11/2014  . Essential hypertension, benign 02/28/2014  . Accelerated hypertension 02/21/2014  . Nausea & vomiting 02/21/2014  . Headache 02/21/2014  . Chest pain 02/21/2014    Orientation RESPIRATION BLADDER Height & Weight      (unable to determine with assessment)  Tracheostomy Incontinent Weight: 72.9 kg Height:  5' 5" (165.1 cm)  BEHAVIORAL SYMPTOMS/MOOD NEUROLOGICAL BOWEL NUTRITION STATUS      Incontinent  Feeding tube  AMBULATORY STATUS COMMUNICATION OF NEEDS Skin   Total Care Does not communicate Normal                       Personal Care Assistance Level of Assistance  Bathing,Feeding,Dressing,Total care Bathing Assistance: Maximum assistance Feeding assistance: Maximum assistance (G-Tube feedings) Dressing Assistance: Maximum assistance Total Care Assistance: Maximum assistance   Functional Limitations Info  Sight,Hearing,Speech Sight Info: Adequate Hearing Info: Adequate Speech Info: Impaired (trach with passe muir valve with therapy - hums at times)    SPECIAL CARE FACTORS FREQUENCY  PT (By licensed PT),OT (By licensed OT),Speech therapy     PT Frequency: 5 x per week OT Frequency: 5 x per week     Speech Therapy Frequency: 5 x per week      Contractures Contractures Info: Not present    Additional Factors Info  Code Status,Allergies,Isolation Precautions Code Status Info: Full code Allergies Info: NKDA     Isolation Precautions Info: contact - ESBL     Current Medications (11/17/2020):  This is the current hospital active medication list Current Facility-Administered Medications  Medication Dose Route Frequency Provider Last Rate Last Admin  . 0.9 %  sodium chloride infusion   Intravenous PRN Laurin Coder, MD   Stopped at 10/26/20 1445  . acetaminophen (TYLENOL) tablet 650 mg  650 mg Oral Q4H PRN Amie Portland, MD   650 mg at 10/28/20 1136   Or  . acetaminophen (TYLENOL) 160 MG/5ML solution 650 mg  650 mg Per Tube Q4H PRN Amie Portland, MD  650 mg at 11/15/20 0836   Or  . acetaminophen (TYLENOL) suppository 650 mg  650 mg Rectal Q4H PRN Amie Portland, MD   650 mg at 10/22/20 0008  . amLODipine (NORVASC) tablet 10 mg  10 mg Per Tube Daily Danford, Suann Larry, MD   10 mg at 11/17/20 0801  . atorvastatin (LIPITOR) tablet 40 mg  40 mg Per Tube Daily Elodia Florence., MD   40 mg at 11/17/20 0801  . baclofen (OZOBAX) 1 mg/mL oral solution 7.5 mg   7.5 mg Per Tube 2 times per day Samella Parr, NP      . chlorhexidine gluconate (MEDLINE KIT) (PERIDEX) 0.12 % solution 15 mL  15 mL Mouth Rinse BID Rosalin Hawking, MD   15 mL at 11/17/20 0801  . Chlorhexidine Gluconate Cloth 2 % PADS 6 each  6 each Topical Daily Amie Portland, MD   6 each at 11/17/20 [phone]  . enoxaparin (LOVENOX) injection 40 mg  40 mg Subcutaneous Q24H Bertis Ruddy, RPH   40 mg at 11/17/20 0801  . feeding supplement (OSMOLITE 1.2 CAL) liquid 1,000 mL  1,000 mL Per Tube Continuous Garvin Fila, MD 55 mL/hr at 11/17/20 1017 1,000 mL at 11/17/20 1017  . feeding supplement (PROSource TF) liquid 45 mL  45 mL Per Tube Daily Garvin Fila, MD   45 mL at 11/17/20 0802  . free water 100 mL  100 mL Per Tube Q4H Rosalin Hawking, MD   100 mL at 11/17/20 1147  . hydrALAZINE (APRESOLINE) injection 5 mg  5 mg Intravenous Q4H PRN Vernelle Emerald, MD   5 mg at 11/02/20 0639  . hydrALAZINE (APRESOLINE) tablet 50 mg  50 mg Per Tube Q8H Elodia Florence., MD   50 mg at 11/17/20 0540  . insulin aspart (novoLOG) injection 0-9 Units  0-9 Units Subcutaneous Q4H Jennelle Human B, NP   1 Units at 11/17/20 1147  . labetalol (NORMODYNE) injection 10 mg  10 mg Intravenous Q4H PRN Merlene Laughter F, NP   10 mg at 10/06/20 2243  . MEDLINE mouth rinse  15 mL Mouth Rinse 5 X Daily Nita Sells, MD   15 mL at 11/17/20 0804  . metoprolol tartrate (LOPRESSOR) 25 mg/10 mL oral suspension 100 mg  100 mg Per Tube BID Samella Parr, NP   100 mg at 11/17/20 0802  . modafinil (PROVIGIL) tablet 200 mg  200 mg Per Tube Daily Meredith Staggers, MD   200 mg at 11/17/20 3704  . morphine 2 MG/ML injection 1 mg  1 mg Intravenous Q6H PRN Elodia Florence., MD   1 mg at 11/08/20 1347  . oxybutynin (DITROPAN) 5 MG/5ML syrup 5 mg  5 mg Per Tube BID Caren Griffins, MD   5 mg at 11/17/20 0802  . oxyCODONE (Oxy IR/ROXICODONE) immediate release tablet 5 mg  5 mg Per Tube Q6H PRN Nita Sells, MD   5  mg at 11/14/20 0939  . pantoprazole sodium (PROTONIX) 40 mg/20 mL oral suspension 40 mg  40 mg Per Tube BID Jennelle Human B, NP   40 mg at 11/17/20 0801  . polyethylene glycol (MIRALAX / GLYCOLAX) packet 17 g  17 g Per Tube Daily PRN Lavina Hamman, MD      . senna-docusate (Senokot-S) tablet 1 tablet  1 tablet Per Tube BID PRN Kipp Brood, MD   1 tablet at 09/28/20 0637  . sennosides (SENOKOT) 8.8 MG/5ML syrup 5  mL  5 mL Per Tube QHS Samella Parr, NP   5 mL at 11/16/20 2116     Discharge Medications: Please see discharge summary for a list of discharge medications.  Relevant Imaging Results:  Relevant Lab Results:   Additional Information SSN: [id number]  Curlene Labrum, RN

## 2020-11-17 NOTE — TOC Progression Note (Addendum)
Transition of Care [facility]) - Progression Note    Patient Details  Name: Charlene [name] MRN: [id number] Date of Birth: 11-04-1965  [facility] CM/SW Contact  Janae Bridgeman, RN Phone Number: 11/17/2020, 8:45 AM  Clinical Narrative:    CM left message with Early Osmond, director of admissions at [facility] facilities, to check on availability of admission bed for the patient for [facility] placement.  CM and MSW will continue to follow to explore admission opportunities for SNF placement -no bed offers at this time.  11/17/2020 1203 - CM called and left a message with Mariel Craft, CM at [facility] to check on availability of beds at the facility for placement.  Edwin Dada, MSW also called and spoke with Isabelle Course, CM at [facility] and clinicals will be sent to the facility as well via secure fax.  11/17/2020 1356 - Secure voicemail left with Angela Nevin, CM and DON at [facility] facilities to follow up on possible bed offers at their facilities.  Expected Discharge Plan: Home w Home Health Services Barriers to Discharge: Continued Medical Work up  Expected Discharge Plan and Services Expected Discharge Plan: Home w Home Health Services In-house Referral: Clinical Social Work Discharge Planning Services: CM Consult   Living arrangements for the past 2 months: Single Family Home                                       Social Determinants of Health (SDOH) Interventions    Readmission Risk Interventions Readmission Risk Prevention Plan 09/29/2020  Transportation Screening Complete  PCP or Specialist Appt within 3-5 Days Complete  HRI or Home Care Consult Complete  Social Work Consult for Recovery Care Planning/Counseling Complete  Palliative Care Screening Complete  Medication Review Oceanographer) Complete  Some recent data might be hidden

## 2020-11-17 NOTE — Progress Notes (Signed)
Physical Therapy Treatment Patient Details Name: Charlene [name] MRN: [id number] DOB: [birth year] Today's Date: 11/17/2020    History of Present Illness Pt is 55 y/o female [facility] on [facility]9 with L sided weakness and headaches. PMH includes anxiety, HTN. 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 s/p Right frontal ventricular catheter placement on 2/10, stopped functioning on 2/11.  MRI 2/11 shows right greater than left mesial temporal lobes / parahippocampal cortex, bilateral basal ganglia, and possibly the upper pons that are concerning for acute infarcts, most likely secondary to mass effect from the hemorrhage. ETT 2/10-present.  Pt with trach and PEG on 09/10/20. Hospital course complicated by persistent fever, Serratia pneumonia and now pre-orbital cellulitis.    PT Comments    Session focused on sitting EOB and postural control (especially head/neck positioning and ROM), attempting to follow simple commands (none today), standing (pt without weight-bearing through LEs with attempts), and tracking (she did move eyes to target 2-3 times during session). Overall, pt remains very low level in terms of functional abilities, however feel continuing to work on cervical ROM with head/neck control may allow pt to access non-verbal communication strategies.    Follow Up Recommendations  SNF     Equipment Recommendations  Hospital bed    Recommendations for Other Services       Precautions / Restrictions Precautions Precautions: Fall Precaution Comments: peg and trach Required Braces or Orthoses: Other Brace Other Brace: RLE soft knee brace and LUE soft elbow brace (wearing schedule in RN orders); bil PRAFOs    Mobility  Bed Mobility Overal bed mobility: Needs Assistance Bed Mobility: Rolling;Sidelying to Sit Rolling: Total assist Sidelying to  sit: Total assist;+2 for physical assistance            Transfers Overall transfer level: Needs assistance Equipment used:  (bed pad as sling under pelvis) Transfers: Sit to/from [employer] Transfers Sit to Stand: Total assist;+2 physical assistance   Squat pivot transfers: Total assist;+2 physical assistance;From elevated surface     General transfer comment: atttempted standing/weight-bearing from EOB x 3 reps with no LE support noted on any trial  Ambulation/Gait             General Gait Details: non ambulatory   Stairs             Wheelchair Mobility    Modified Rankin (Stroke Patients Only) Modified Rankin (Stroke Patients Only) Pre-Morbid Rankin Score: No symptoms Modified Rankin: Severe disability     Balance Overall balance assessment: Needs assistance Sitting-balance support: No upper extremity supported;Feet supported Sitting balance-Leahy Scale: Poor   Postural control: Posterior lean;Left lateral lean     Standing balance comment: unable                            Cognition Arousal/Alertness: Awake/alert (initially asleep, but awakened as splints/pillows removed) Behavior During Therapy: Flat affect Overall Cognitive Status: Impaired/Different from baseline Area of Impairment: Attention;Following commands                   Current Attention Level: Focused   Following Commands:  (not following one step commands today even when given up to 45 seconds and tactile cues)     Problem Solving: Slow processing;Decreased initiation;Requires verbal cues;Requires tactile cues General Comments: attempted "thumbs up" "squeeze my hand" "keep your  head up" without success      Exercises Other Exercises Other Exercises: prior to EOB, bil knee extension and DF stretches achieving ~ -10 degrees extension on left and -15 on right    General Comments General comments (skin integrity, edema, etc.): EOB worked on upright  sitting posture including cervical retraction and extension to achieve neutral position; pt unable to hold her head in this position after multiple trials of positioning and then slowly withdrawing physical support to her head/neck. Worked on cervical ROM with pt tolerating ~20 degrees rotation to the left and ~10 degrees to the right. once pt in recliner, able to position head against headrest portion of chair (close to neutral extension) and pt holding head turned to the right ~25 degrees. Patient noted to move her right eye from right endrange to midline and twice past midline multiple times during session (sometimes to her name being called vs loud noise)      Pertinent Vitals/Pain Pain Assessment: Faces Faces Pain Scale: Hurts even more Pain Location: with endrange forward flexion in sitting Pain Descriptors / Indicators: Discomfort Pain Intervention(s): Limited activity within patient's tolerance;Monitored during session    Home Living                      Prior Function            PT Goals (current goals can now be found in the care plan section) Acute Rehab PT Goals Patient Stated Goal: Pt unable to state Time For Goal Achievement: 11/30/20 Potential to Achieve Goals: Poor Progress towards PT goals: Progressing toward goals    Frequency    Min 2X/week      PT Plan Current plan remains appropriate    Co-evaluation              AM-PAC PT "6 Clicks" Mobility   Outcome Measure  Help needed turning from your back to your side while in a flat bed without using bedrails?: Total Help needed moving from lying on your back to sitting on the side of a flat bed without using bedrails?: Total Help needed moving to and from a bed to a chair (including a wheelchair)?: Total Help needed standing up from a chair using your arms (e.g., wheelchair or bedside chair)?: Total Help needed to walk in hospital room?: Total Help needed climbing 3-5 steps with a railing? :  Total 6 Click Score: 6    End of Session   Activity Tolerance: Patient tolerated treatment well Patient left: with call bell/phone within reach;in chair Nurse Communication: Mobility status;Need for lift equipment;Other (comment) (pt to stay up at most 2 hours to decrease risk of pressure sores from inability to pressure relieve) PT Visit Diagnosis: Hemiplegia and hemiparesis;Other symptoms and signs involving the nervous system (R29.898);Other abnormalities of gait and mobility (R26.89) Hemiplegia - Right/Left: Right Hemiplegia - dominant/non-dominant: Dominant Hemiplegia - caused by: Cerebral infarction;Nontraumatic intracerebral hemorrhage;Other Nontraumatic intracranial hemorrhage     Time: 6384-5364 PT Time Calculation (min) (ACUTE ONLY): 46 min  Charges:  $Neuromuscular Re-education: 38-52 mins                      Jerolyn Center, PT Pager [phone]    Zena Amos 11/17/2020, 2:21 PM

## 2020-11-17 NOTE — Progress Notes (Signed)
Occupational Therapy Treatment Patient Details Name: Charlene [name] MRN: [id number] DOB: 27-May-1966 Today's Date: 11/17/2020    History of present illness Pt is 55 y/o female presents to [facility] on 2/9 with L sided weakness and headaches. PMH includes anxiety, HTN. 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 s/p Right frontal ventricular catheter placement on 2/10, stopped functioning on 2/11.  MRI 2/11 shows right greater than left mesial temporal lobes / parahippocampal cortex, bilateral basal ganglia, and possibly the upper pons that are concerning for acute infarcts, most likely secondary to mass effect from the hemorrhage. ETT 2/10-present.  Pt with trach and PEG on 09/10/20. Hospital course complicated by persistent fever, Serratia pneumonia and now pre-orbital cellulitis.   OT comments  Patient seated in recliner upon entry into room, R lateral lean and repositioned to midline.  Patient with partial occlusion glasses on and tracking therapist towards R end range then towards L to central gaze only.  Minimal head/neck movement noted with tracking, mild nystagmus with L tracking. With assistance pt able to reach towards and grasp washcloth, picture and lotion using thumb and index finger; total assist for release required; unable to utilize items functionally due to increased extension spasticity.  RN assisted with transfer back to bed due to R lateral lean in recliner via maxi move.  Donned splints. Will follow acutely.    Follow Up Recommendations  SNF;Supervision/Assistance - 24 hour    Equipment Recommendations  None recommended by OT    Recommendations for Other Services      Precautions / Restrictions Precautions Precautions: Fall Precaution Comments: peg and trach Required Braces or Orthoses: Other Brace Other Brace: RLE soft knee brace and LUE  soft elbow brace (wearing schedule in RN orders); bil PRAFOs Restrictions Weight Bearing Restrictions: No       Mobility Bed Mobility Overal bed mobility: Needs Assistance Bed Mobility: Rolling Rolling: Total assist Sidelying to sit: Total assist;+2 for physical assistance       General bed mobility comments: to adjust pads after return to bed via maximove    Transfers Overall transfer level: Needs assistance Equipment used:  (bed pad as sling under pelvis) Transfers: Sit to/from [employer] Sit to Stand: Total assist;+2 physical assistance   Squat pivot transfers: Total assist;+2 physical assistance;From elevated surface     General transfer comment: maximove assisted transfer with RN back to bed    Balance Overall balance assessment: Needs assistance Sitting-balance support: No upper extremity supported;Feet supported Sitting balance-Leahy Scale: Poor Sitting balance - Comments: suppported sitting in recliner upon entry with heavy R lateral lean Postural control: Right lateral lean     Standing balance comment: unable                           ADL either performed or assessed with clinical judgement   ADL                                         General ADL Comments: attempted to have pt wipe mouth with R UE but greatly limited by extensor spasticity in RUE, able to assist with elbow flexion to 90 but unable to bring fully towards chin.  Able to rub lotion on L arm with max  assist using RUE.     Vision   Additional Comments: pt wearing occulsion glasses throughout session, able to scan with R eye to midline to track therapist and picture of her children but not past midline today; mild horizontal beating nystagmus when scanning towards L. Minimal head/neck movement noted as well.   Perception     Praxis      Cognition Arousal/Alertness: Awake/alert Behavior During Therapy: Flat affect Overall Cognitive Status:  Impaired/Different from baseline Area of Impairment: Attention;Following commands;Problem solving                   Current Attention Level: Focused   Following Commands: Follows one step commands inconsistently     Problem Solving: Slow processing;Decreased initiation;Requires verbal cues;Requires tactile cues General Comments: patient grabbing wash cloth, picture of her kids and lotion bottle with incresaed time and multimodal cueing        Exercises Other Exercises Other Exercises: prior to EOB, bil knee extension and DF stretches achieving ~ -10 degrees extension on left and -15 on right Other Exercises: passive prolonged stretch to L elbow, applied soft elbow splint   Shoulder Instructions       General Comments EOB worked on upright sitting posture including cervical retraction and extension to achieve neutral position; pt unable to hold her head in this position after multiple trials of positioning and then slowly withdrawing physical support to her head/neck. Worked on cervical ROM with pt tolerating ~20 degrees rotation to the left and ~10 degrees to the right. once pt in recliner, able to position head against headrest portion of chair (close to neutral extension) and pt holding head turned to the right ~25 degrees. Patient noted to move her right eye from right endrange to midline and twice past midline multiple times during session (sometimes to her name being called vs loud noise)    Pertinent Vitals/ Pain       Pain Assessment: Faces Faces Pain Scale: Hurts a little bit Pain Location: with L elbow extension Pain Descriptors / Indicators: Grimacing Pain Intervention(s): Limited activity within patient's tolerance;Monitored during session;Repositioned;Other (comment) (applied splints)  Home Living                                          Prior Functioning/Environment              Frequency  Min 2X/week        Progress Toward  Goals  OT Goals(current goals can now be found in the care plan section)  Progress towards OT goals: Progressing toward goals (slowly)  Acute Rehab OT Goals Patient Stated Goal: Pt unable to state  Plan Discharge plan remains appropriate;Frequency remains appropriate    Co-evaluation                 AM-PAC OT "6 Clicks" Daily Activity     Outcome Measure   Help from another person eating meals?: Total Help from another person taking care of personal grooming?: Total Help from another person toileting, which includes using toliet, bedpan, or urinal?: Total Help from another person bathing (including washing, rinsing, drying)?: Total Help from another person to put on and taking off regular upper body clothing?: Total Help from another person to put on and taking off regular lower body clothing?: Total 6 Click Score: 6    End of Session Equipment Utilized During Treatment: Oxygen (trach  collar)  OT Visit Diagnosis: Muscle weakness (generalized) (M62.81);Apraxia (R48.2);Cognitive communication deficit (R41.841);Hemiplegia and hemiparesis Symptoms and signs involving cognitive functions: Other Nontraumatic ICH Hemiplegia - caused by: Other Nontraumatic intracranial hemorrhage   Activity Tolerance Patient tolerated treatment well   Patient Left in bed;with call bell/phone within reach;with nursing/sitter in room   Nurse Communication Mobility status        Time: 0712-1975 OT Time Calculation (min): 37 min  Charges: OT General Charges $OT Visit: 1 Visit OT Treatments $Self Care/Home Management : 8-22 mins $Neuromuscular Re-education: 8-22 mins  Barry Brunner, OT Acute Rehabilitation Services Pager [phone] Office [phone]    [name] 11/17/2020, 3:37 PM

## 2020-11-17 NOTE — Progress Notes (Signed)
CSW spoke with patient's husband [name] to inquire about when his next planned visit was - [name] to come to the hospital tomorrow, 5/4 around 10:30am. DTP CSW, RN CM, and NP will meet with [name] at bedside.  Edwin Dada, MSW, LCSW Transitions of Care  Clinical Social Worker II [phone]

## 2020-11-17 NOTE — Progress Notes (Signed)
[facility] PROGRESS NOTE  [name]:[id number] DOB: [birth year] DOA: 08/26/2020 PCP: Patient, [name] (Inactive)    October 08, 2020     10/16/2020                        10/16/2020     10/18/2020                        10/20/2020    Status: Remains inpatient appropriate because:Altered mental status, Unsafe d/c plan, IV treatments appropriate due to intensity of illness or inability to take PO and Inpatient level of care appropriate due to severity of illness   Dispo: The patient is from: Home              Anticipated d/c is to: SNF vs brain injury recovery rehab-family does not wish to place patient in SNF              Patient currently is medically stable to d/c.   Difficult to place patient Yes   Level of care: Progressive  Code Status: Full Family Communication:  extensive conversation via telephone with daughter Charlene [name] [phone]) at bedside on 4/1.; husband Charlene [name] [phone]) is her POA and legal contact.  4/21 husband at bedside DVT prophylaxis: enoxaparin (LOVENOX) injection 40 mg Start: 09/01/20 1600 SCD's Start: 08/27/20 0026   Vaccination status: Unvaccinated  Micro Data:  2/9 COVID + Influenza negative 2/10 MRSA PCR negative 2/12 BCx 2 > no growth  2/19 BC 1/2 +Stap EPI, 1/2 no growth  10/07/2020 sputum positive for Serratia marcescens 4/8 Enterobacter cloaca + urine culture MDR sensitive only to imipenem and gentamicin   Antimicrobials:  Cefepime 2/12 > 2/15 Vanc 2/12 > 2/15 10/08/2020 ciprofloxacin>>3/30 Unasyn 3/30 >> 4/02 Rocephin 4/11 > 4/12 Meropenem 4/12>4/17   HPI: 55 yo F with 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  Subsequently on 2/10, she developed obtundation and respiratory arrest leading to intubation.  She has remained persistently obtunded, now has a trach and a PEG.  Hospital course complicated by persistent fever, Serratia pneumonia and  now pre-orbital cellulitis  As of 3/25 with adjustments in hypertonicity medications patient began to improve regarding hypertonicity.  Eventually started on provigil with improvements in alertness.  Evaluated by rehab physician who recommended increasing provigil dose further.  Subsequently patient has become more alert.  Most days she is able to track and with encouragement able to follow simple commands especially since partial obstruction glasses placed.  See progress note dictated 4/27 for expanded details.   2/09 Admit for [facility] 2/10 EVD placed, later intubated due to obtundation and respiratory arrest 2/11 MRI Brain, hypertonic saline d/c'd, EVD not working.  Husband did not consent to PICC  2/13 EVD removed 2/17 [place] discussion with family, PMT 2/24 Bedside trach and PEG 3/1 Transferred to [facility]  Subjective: Awake, Not tracking, noticeably grimacing when mother at bedside performing range of motion of lower extremities.  Mother also notes patient having discomfort with range of motion applied to left arm  Objective: Vitals:   11/17/20 0307 11/17/20 0332  BP:  110/75  Pulse: 87 88  Resp: 17 18  Temp:  98.6 F (37 C)  SpO2: 99% 99%    Intake/Output Summary (Last 24 hours) at 11/17/2020 0759 Last data filed at 11/17/2020 3094 Gross per 24 hour  Intake 960 ml  Output --  Net 960 ml  Filed Weights   11/14/20 0453 11/15/20 0500 11/16/20 0500  Weight: 72 kg 73.2 kg 72.9 kg    Exam:  Constitutional: Eyes open, appears uncomfortable Respiratory: #6.0 cuffless trach, trach collar with FiO2 21% at 5L,O2 sats 97 to 99%.  Anterior lungs are clear to auscultation.  No increased work of breathing.  No tracheal secretions. Cardiovascular: Normal heart sounds, no tachycardia, no peripheral edema. Abdomen:  PEG tube-LBM 5/2, soft and nontender.  Normoactive bowel sounds.  Tube feeding infusing Musculoskeletal: Bilateral lower extremities noted with atrophy of thigh and calf  muscles Neurologic: CN 2-12 intact except notable with left ocular disconjugate gaze with medial right downward gaze. Lower extremity foot splints in place.  Able to track with right eye. Patient has spontaneous purposeful movement that is gross motor in nature of right upper extremity.  Neck somewhat more stiff today with slight leftward turn.  Assisted mother with placing towel roll to improve head positioning Psych: Eyes open.  Nonverbal and unable to accurately assess orientation   Assessment/Plan: Acute problems: Multiple acute strokes  Right Thalamus ICH with IVH  Obstructive Hydrocephalus/Acute on persistent metabolic Encephalopathy/brain injury related hypertonicity -Continue baclofen and on 5/3 will increase daytime dosing to 7.5 mg as well.-repeat LFTs 5/1 unremarkable -Continue Provigil 200 mg daily  -Continue scheduled Senokot for constipation prophylaxis -Continue OOB to chair daily and PROM every 4 hours per nursing staff, OT, PT, SLP -5/2, CIR MD reevaluated patient " Mrs. Resor unfortunately has not made any meaningful functional gains over the last month. She remains most appropriate for SNF level care".   Neck Contracture/torticollis --Continue baclofen as above -- continue PT/OT and other supportive care  Hypertensive emergency/Malignant HTN -Had been prescribed carvedilol and hydrochlorothiazide prior to admission but was unable to afford -Continue Norvasc, Lopressor and hydralazine.   Acute hypoxemic respiratory failure secondary to CVA requiring mechanical ventilation and tracheostomy tube -Stable, #6 cuffless trach with collar -Per trach team: Has significant altered mentation and inability to manage oral secretions at present time is not a safe candidate for decannulation.  Discussed this with family as major obstacle in discharging to home.  This will also decrease options for SNF placement.  Dysphagia -Goal is to prevent malnutrition.  Patient remained stable  on tube feedings per PEG along with Prosource liquid and free water to maintain adequate hydration.  Also will continue to follow CBGs and provide SSI if indicated     Other problems: Right upper extremity thrombophlebitis - [name] -> age indeterminate SVT of right cephalic vein  ESBL positive Enterobacter UTI -Has completed 5 days of meropenem -Contact isolation during hospitalization -continue Oxybutynin suspected bladder spasms  Bacterial Conjunctivitis /periorbital cellulitis -Resolved  Chronic pansinusitis/recurrent fevers 2/2 aspiration PNA;History of Serratia Pneumonia  Hospital Acquired Pneumonia:  -Completed several courses of antibiotic -Follow-up imaging including sinus CT consistent with chronic but stable and resolving sinusitis   Anemia -Stable  HLD -Continue Lipitor      Data Reviewed: Basic Metabolic Panel: Recent Labs  Lab 11/15/20 0150  NA 139  K 3.8  CL 99  CO2 30  GLUCOSE 103*  BUN 16  CREATININE 0.50  CALCIUM 9.3   Liver Function Tests: Recent Labs  Lab 11/15/20 0150  AST 25  ALT 41  ALKPHOS 140*  BILITOT 0.3  PROT 6.9  ALBUMIN 2.7*   CBC: Recent Labs  Lab 11/15/20 0150  WBC 5.7  HGB 10.1*  HCT 32.0*  MCV 80.0  PLT 390    CBG: Recent Labs  Lab 11/16/20 1256 11/16/20 1641 11/16/20 2044 11/16/20 2340 11/17/20 0406  GLUCAP 139* 120* 104* 139* 138*     Studies: No results found.  Scheduled Meds: . amLODipine  10 mg Per Tube Daily  . atorvastatin  40 mg Per Tube Daily  . baclofen  5 mg Per Tube 2 times per day  . baclofen  7.5 mg Per Tube Daily  . chlorhexidine gluconate (MEDLINE KIT)  15 mL Mouth Rinse BID  . Chlorhexidine Gluconate Cloth  6 each Topical Daily  . enoxaparin (LOVENOX) injection  40 mg Subcutaneous Q24H  . feeding supplement (PROSource TF)  45 mL Per Tube Daily  . free water  100 mL Per Tube Q4H  . hydrALAZINE  50 mg Per Tube Q8H  . insulin aspart  0-9 Units Subcutaneous Q4H  . mouth rinse  15  mL Mouth Rinse 5 X Daily  . metoprolol tartrate  100 mg Per Tube BID  . modafinil  200 mg Per Tube Daily  . oxybutynin  5 mg Per Tube BID  . pantoprazole sodium  40 mg Per Tube BID  . sennosides  5 mL Per Tube QHS   Continuous Infusions: . sodium chloride Stopped (10/26/20 1445)  . feeding supplement (OSMOLITE 1.2 CAL) 1,000 mL (11/15/20 1544)    Principal Problem:   ICH (intracerebral hemorrhage) ([place]) Active Problems:   Intraparenchymal hemorrhage of brain (HCC)   Acute hypoxemic respiratory failure (HCC)   Fever   Endotracheal tube present   Status post tracheostomy ([place])   Palliative care by specialist   Muscle hypertonicity   Oropharyngeal dysphagia   Brain injury with loss of consciousness ([place])   Poor prognosis   Aspiration pneumonia of right lower lobe (HCC)   Gram-negative infection   Abnormal urinalysis   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   Acute cystitis without hematuria   Consultants:  Neurology   Neurosurgery.   Palliative care.    PCCM continues to follow.  No decannulation.   Procedures:  Tracheostomy  PEG tube  EEG  Echocardiogram  Antibiotics: Vancomycin 2/12-2/15 Maxipime 2/12-2/15 Ancef 2/19-2/27 Vancomycin 2/20 x 1 dose Maxipime 2/27- 3/5 Vancomycin 3/1-3/3 Ancef 3/6-3/14 Cipro 3/20 4-3/30 Unasyn 3/30-4/4 Rocephin 4/11-4/12 Meropenem 4/12-4/17   Time spent: 25 minutes    Erin Hearing ANP  [facility] 7 am - 330 pm/M-F for direct patient care and secure chat Please refer to [place] for contact info 82  Days

## 2020-11-18 LAB — GLUCOSE, CAPILLARY
Glucose-Capillary: 128 mg/dL — ABNORMAL HIGH (ref 70–99)
Glucose-Capillary: 132 mg/dL — ABNORMAL HIGH (ref 70–99)
Glucose-Capillary: 141 mg/dL — ABNORMAL HIGH (ref 70–99)
Glucose-Capillary: 141 mg/dL — ABNORMAL HIGH (ref 70–99)
Glucose-Capillary: 141 mg/dL — ABNORMAL HIGH (ref 70–99)
Glucose-Capillary: 144 mg/dL — ABNORMAL HIGH (ref 70–99)
Glucose-Capillary: 147 mg/dL — ABNORMAL HIGH (ref 70–99)
Glucose-Capillary: 149 mg/dL — ABNORMAL HIGH (ref 70–99)
Glucose-Capillary: 90 mg/dL (ref 70–99)
Glucose-Capillary: 95 mg/dL (ref 70–99)

## 2020-11-18 NOTE — Progress Notes (Signed)
  Speech Language Pathology Treatment: Charlene [name] Speaking valve;Dysphagia  Patient Details Name: Charlene [name] MRN: [id number] DOB: January 24, 1966 Today's Date: 11/18/2020 Time: 2094-7096 SLP Time Calculation (min) (ACUTE ONLY): 30 min  Assessment / Plan / Recommendation Clinical Impression  Co-treated with OT for last 30 minutes of their session to maximize positioning for participation.  Pt sitting EOB with OT support.  PMV placed. Husband, Charlene [name], was trained to donn/doff valve and verbalized understanding of parameters under which valve should be removed. He demonstrated ability to place and remove valve.  With valve in place, pt achieved spontaneous voicing intermittently throughout session - voicing was non-purposeful at this time, but positive reinforcement was provided each time voicing occurred.  Pt demonstrated occasional initiation to follow simple commands when there was physical and contextual support.  On one occasion, she opened lips to anticipate arrival of ice at lips. She demonstrated improved initiation to actively chew ice and swallow was visible.  She required multimodal cues throughout session.  She demonstrated ongoing ability to fix gaze and occasionally make eye contact with clinician when positioned in front of her/slightly to right.    SLP will continue to follow for fundamental communication abilities and basic swallowing.    HPI HPI: Pt is 55 y/o female presents to [facility] on 2/9 with L sided weakness and headaches. PMH includes anxiety, HTN. 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 s/p Right frontal ventricular catheter placement on 2/10, stopped functioning on 2/11.  MRI 2/11 shows right greater than left mesial temporal lobes / parahippocampal cortex, bilateral basal ganglia, and possibly the upper pons that are concerning for acute  infarcts, most likely secondary to mass effect from the hemorrhage. ETT 2/10; trach/PEG 09/10/20.  Trach changed to #6 cuffless 3/11. Hospital course complicated by persistent fever,  Serratia pneumonia and pre-orbital cellulitis.      SLP Plan  Continue with current plan of care       Recommendations         Patient may use Passy-Muir Speech Valve: Intermittently with supervision;Caregiver trained to provide supervision;During all therapies with supervision PMSV Supervision: Full         Oral Care Recommendations: Oral care QID Follow up Recommendations: Skilled Nursing facility SLP Visit Diagnosis: Dysphagia, unspecified (R13.10);Aphonia (R49.1) Plan: Continue with current plan of care       GO              Elihu Milstein L. Samson [name], MA CCC/SLP Acute Rehabilitation Services Office number [phone] Pager [phone]   [name] 11/18/2020, 3:38 PM

## 2020-11-18 NOTE — Progress Notes (Addendum)
11:30am: DTP staff met with patient's husband [name] at bedside to present him with bed offer from [facility] in [place]. TOC staff explained to [name] how to transfer patient back closer to [facility] if her Medicaid is approved. TOC staff explained that [facility] may be an option for placement if patient is unable to be decannulated due to their abilities to accommodate a trach and PEG tube.   CSW completed written document explaining all information discussed during meeting so [name] can present it to the rest of the family. CSW advised [name] that a decision needed to be made before the end of business on 11/19/20.   10:15am: CSW spoke with Sherrie Sport from [facility] who states there are no beds available within the company that accomodate the patient's needs. Sherrie Sport states if the company were to have any bed openings, the closest facility that is able to accomodate a trach and peg is 2.5 hours away. [facility] is at capacity and cannot accommodate a trach or peg at this time.  CSW spoke with Prem a [facility] who states there is not a Medicaid waiting list for SNF, but once the patient is approved for Medicaid the facility she goes to can make the referral on her behalf.   [name], MSW, LCSW Transitions of Care  Clinical Social Worker II [phone]

## 2020-11-18 NOTE — Progress Notes (Signed)
No Charge Progress Note.  Chart briefly reviewed.  Patient is being followed daily by Ms. Junious Silk, NP.  I discussed with Ms. Rennis Harding.  On my visit, patient nonverbal, staring out of her room window, not tracking and nonverbal. Vitals:   11/18/20 0314 11/18/20 0734 11/18/20 0834 11/18/20 1136  BP: 122/75 123/79  125/72  Pulse: 83 83 88 83  Resp: 19 17 18  (!) 21  Temp: 98 F (36.7 C) 98.5 F (36.9 C)  98.7 F (37.1 C)  TempSrc: Oral Axillary  Oral  SpO2: 100% 99% 99% 100%  Weight:      Height:        General exam: Young female, moderately built and nourished, did not appear in any distress, lying comfortably propped up in bed. Neck: Tracheostomy with trach collar in place. Respiratory system: Slightly diminished breath sounds in the bases but otherwise clear to auscultation. Cardiovascular system: S1 & S2 heard, RRR. No JVD, murmurs, rubs, gallops or clicks. No pedal edema.  Telemetry personally reviewed: Sinus rhythm. Gastrointestinal system: Abdomen is nondistended, soft and nontender. No organomegaly or masses felt. Normal bowel sounds heard.  PEG tube intact and tube feeds ongoing. Central nervous system: Alert with eyes open but rest of mental status as above. No focal neurological deficits. Extremities: Appears to have increased tone or contractures of extremities.  No spontaneous or purposeful movements noted. Skin: Did not assess. Psychiatry: Judgement and insight impaired. Mood & affect flat.  Lab work No recent labs.  CBGs reasonably controlled.   Assessment and plan:  1. Multiple acute strokes complicated by ICH with IVH, obstructive hydrocephalus, persistent encephalopathy: S/p trach and PEG.  Difficult disposition, TOC team following.  Per CIR MD, appropriate for SNF level care. 2. Rest of problems as per Ms. Progress Note.  Rennis Harding, MD, [place], [facility]. [facility]  To contact the attending provider between 7A-7P or the covering provider during  after hours 7P-7A, please log into the web site [email or web address] and access using universal [place] password for that web site. If you do not have the password, please call the hospital operator.

## 2020-11-18 NOTE — Progress Notes (Signed)
Occupational Therapy Treatment Patient Details Name: Charlene [name] MRN: [id number] DOB: [birth year] Today's Date: 11/18/2020    History of present illness Pt is 55 y/o female presents to [facility] on 2/9 with L sided weakness and headaches. PMH includes anxiety, HTN. 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 s/p Right frontal ventricular catheter placement on 2/10, stopped functioning on 2/11.  MRI 2/11 shows right greater than left mesial temporal lobes / parahippocampal cortex, bilateral basal ganglia, and possibly the upper pons that are concerning for acute infarcts, most likely secondary to mass effect from the hemorrhage. ETT 2/10-present.  Pt with trach and PEG on 09/10/20. Hospital course complicated by persistent fever, Serratia pneumonia and now pre-orbital cellulitis.   OT comments  Pt seen for facilitation of trunk control while EOB.  She was able to flex Lt knee on command x 2.  She demonstrated improved passive trunk mobility, and increased ability to turn head purposefully to look at spouse.  SLP present and placed PMV and started trials of ice chips.    Follow Up Recommendations  SNF;Supervision/Assistance - 24 hour    Equipment Recommendations  None recommended by OT    Recommendations for Other Services      Precautions / Restrictions Precautions Precautions: Fall Precaution Comments: peg and trach Required Braces or Orthoses: Other Brace Splint/Cast - Date Prophylactic Dressing Applied (if applicable): 11/18/20 Other Brace: RLE soft knee brace and LUE soft elbow brace (wearing schedule in RN orders); bil PRAFOs       Mobility Bed Mobility Overal bed mobility: Needs Assistance       Supine to sit: Max assist;+2 for safety/equipment Sit to supine: Total assist;+2 for physical assistance   General bed mobility comments: Pt was  able to assist wtih flexion of Lt knee and with lifting trunk minimally    Transfers                      Balance Overall balance assessment: Needs assistance Sitting-balance support: No upper extremity supported;Feet supported Sitting balance-Leahy Scale: Poor Sitting balance - Comments: Pt required min A and at times mod A for EOB sitting                                   ADL either performed or assessed with clinical judgement   ADL     Eating/Feeding Details (indicate cue type and reason): SLP present.  Placed spoon in pt's right hand and moved spoon with ice chip to her mouth with max facilitation, but she extended elbow  - unable to determine if this was volitional, spasticity, or motor planning deficit                                         Vision   Additional Comments: occlusion glasses in place.  She was able to visually fixate on people speaking to her - especially her spouse and was able to track them on her Rt ~60-65% of the time   Perception     Praxis      Cognition Arousal/Alertness: Awake/alert Behavior During Therapy: Flat affect Overall Cognitive Status: Impaired/Different from baseline  General Comments: Pt grabbing at pillows and knees attempting to change position to reduce pain/discomfort.  She was able to flex Lt knee on command x 2.  She fixated and tracked from Rt to midline65% of the time, but demonstrated increased difficulty as she fatigued.  She questionably chewed ice on command and possibly initiated swallow on command vs spontaneous behavior        Exercises Other Exercises Other Exercises: While EOB, soft tissue mobilization performed for anterior and posterior pelvic tilts, thoracic extension and rib cage moblization followed by facilitation of anterior posterior tilts and trunk rotation and head/neck flexion/extension and rotation (pt requires max - total A  to initiate movement).  She was able to turn head to look toward husband on her Rt. SLP arrived and worked with pt with PMV and with eating ice cubes.  Pt required total A to use yaunker, and was able to initiate swallow and chew ice questionably on commands.   Shoulder Instructions       General Comments      Pertinent Vitals/ Pain       Pain Assessment: Faces Faces Pain Scale: Hurts even more Pain Location: ? knees after being cleaned up by RN.  Pt was extremely restless, but once knees extended restlessness decreased Pain Descriptors / Indicators: Restless Pain Intervention(s): Repositioned;Monitored during session  Home Living                                          Prior Functioning/Environment              Frequency  Min 2X/week        Progress Toward Goals  OT Goals(current goals can now be found in the care plan section)  Progress towards OT goals: Progressing toward goals     Plan Discharge plan remains appropriate;Frequency remains appropriate    Co-evaluation    PT/OT/SLP Co-Evaluation/Treatment: Yes Reason for Co-Treatment: Complexity of the patient's impairments (multi-system involvement);Necessary to address cognition/behavior during functional activity;To address functional/ADL transfers   OT goals addressed during session: Strengthening/ROM;ADL's and self-care      AM-PAC OT "6 Clicks" Daily Activity     Outcome Measure   Help from another person eating meals?: Total Help from another person taking care of personal grooming?: Total Help from another person toileting, which includes using toliet, bedpan, or urinal?: Total Help from another person bathing (including washing, rinsing, drying)?: Total Help from another person to put on and taking off regular upper body clothing?: Total Help from another person to put on and taking off regular lower body clothing?: Total 6 Click Score: 6    End of Session Equipment Utilized  During Treatment: Oxygen  OT Visit Diagnosis: Muscle weakness (generalized) (M62.81);Apraxia (R48.2);Cognitive communication deficit (R41.841);Hemiplegia and hemiparesis Symptoms and signs involving cognitive functions: Other Nontraumatic ICH Hemiplegia - Right/Left: Right Hemiplegia - caused by: Other Nontraumatic intracranial hemorrhage   Activity Tolerance Patient tolerated treatment well   Patient Left in bed;with call bell/phone within reach;with family/visitor present   Nurse Communication Mobility status        Time: 9622-2979 OT Time Calculation (min): 78 min  Charges: OT General Charges $OT Visit: 1 Visit OT Treatments $Neuromuscular Re-education: 38-52 mins  Eber Jones., OTR/L Acute Rehabilitation Services Pager [phone] Office [phone]    Jeani Hawking M 11/18/2020, 5:34 PM

## 2020-11-18 NOTE — Progress Notes (Signed)
[facility] PROGRESS NOTE  Charlene [name] FVC:[id number] DOB: 08-09-1965 DOA: 08/26/2020 PCP: Patient, [name] (Inactive)    October 08, 2020     10/16/2020                        10/16/2020     10/18/2020                        10/20/2020    Status: Remains inpatient appropriate because:Altered mental status, Unsafe d/c plan, IV treatments appropriate due to intensity of illness or inability to take PO and Inpatient level of care appropriate due to severity of illness   Dispo: The patient is from: Home              Anticipated d/c is to: SNF vs brain injury recovery rehab-family does not wish to place patient in SNF              Patient currently is medically stable to d/c.   Difficult to place patient Yes   Level of care: Progressive  Code Status: Full Family Communication: Checked information: [name] [phone]); [name] [phone]) is her POA and legal contact.  5/4: Updated husband [name] at bedside with assistance of CM and LCSW regarding readiness for discharge and availability of SNF bed.  Explained they will rationale for limited availability of SNF beds that can accept Mrs. Charlene [name] noting primary barrier is her current trach tube.  Abdomen was made aware that he and her family need to give [name] a decision regarding disposition by 5/5.  We understand that they will probably go this weekend to review the facility. DVT prophylaxis: enoxaparin (LOVENOX) injection 40 mg Start: 09/01/20 1600 SCD's Start: 08/27/20 0026   Vaccination status: Unvaccinated-have discussed both with patient's mother and with [name].  He wishes to speak to the rest of the family before making a decision but is leaning towards [name] vaccine.  Micro Data:  2/9 COVID + Influenza negative 2/10 MRSA PCR negative 2/12 BCx 2 > no growth  2/19 BC 1/2 +Stap EPI, 1/2 no growth  10/07/2020 sputum positive for Serratia marcescens 4/8 Enterobacter cloaca + urine culture MDR sensitive  only to imipenem and gentamicin   Antimicrobials:  Cefepime 2/12 > 2/15 Vanc 2/12 > 2/15 10/08/2020 ciprofloxacin>>3/30 Unasyn 3/30 >> 4/02 Rocephin 4/11 > 4/12 Meropenem 4/12>4/17   HPI: 55 yo F with 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  Subsequently on 2/10, she developed obtundation and respiratory arrest leading to intubation.  She has remained persistently obtunded, now has a trach and a PEG.  Hospital course complicated by persistent fever, Serratia pneumonia and now pre-orbital cellulitis  As of 3/25 with adjustments in hypertonicity medications patient began to improve regarding hypertonicity.  Eventually started on provigil with improvements in alertness.  Evaluated by rehab physician who recommended increasing provigil dose further.  Subsequently patient has become more alert.  Most days she is able to track and with encouragement able to follow simple commands especially since partial obstruction glasses placed.  See progress note dictated 4/27 for expanded details.   2/09 Admit for [place] 2/10 EVD placed, later intubated due to obtundation and respiratory arrest 2/11 MRI Brain, hypertonic saline d/c'd, EVD not working.  Husband did not consent to PICC  2/13 EVD removed 2/17 [place] discussion with family, PMT 2/24 Bedside trach and PEG 3/1 Transferred to  [facility]  Subjective: Evaluated patient earlier this morning.  Was awake but not tracking or trying to follow commands.  Later returned to speak to husband regarding discharge readiness and availability of SNF.  Patient was tracking has been in room and had actually turned her head towards the left to see husband.  [name] was updated in detail regarding patient's readiness for discharge and that she did not have any acute care needs that would require additional hospitalization.  Emotional support given noting we understand that having patient more than  2 hours away would be very difficult but this is the only option we have an patient is no longer eligible to remain in the acute care setting.  He was made aware that once her Medicaid becomes active he can seek facilities closer to [facility] noting we are limited to sending patient to a facility that will accept a letter of guarantee from [facility].  Objective: Vitals:   11/17/20 2314 11/18/20 0314  BP: 110/64 122/75  Pulse: 82 83  Resp: 19 19  Temp: 98 F (36.7 C) 98 F (36.7 C)  SpO2: 100% 100%   No intake or output data in the 24 hours ending 11/18/20 0758 Filed Weights   11/14/20 0453 11/15/20 0500 11/16/20 0500  Weight: 72 kg 73.2 kg 72.9 kg    Exam:  Constitutional: Awake, calm, uncomfortable when right foot flexed towards head as evidenced by grimacing Respiratory: #6.0 cuffless trach, trach collar with FiO2 21% at 5L,O2 sats 97 to 99%.  Clear, no tracheal secretions. Cardiovascular: Per tensive, no tachycardia.  No peripheral edema Abdomen:  PEG tube-LBM 5/2, soft and nontender with normoactive bowel sounds and tube feeding infusing Musculoskeletal: Bilateral lower extremities noted with atrophy of thigh and calf muscles Neurologic: CN 2-12 intact except notable with left ocular disconjugate gaze with medial right downward gaze. Lower extremity foot splints in place.  Able to track with right eye. Patient has spontaneous purposeful movement that is gross motor in nature of right upper extremity. Psych: Eyes are open and is awake but nonverbal so unable to accurately assess orientation.   Assessment/Plan: Acute problems: Multiple acute strokes  Right Thalamus ICH with IVH  Obstructive Hydrocephalus/Acute on persistent metabolic Encephalopathy/brain injury related hypertonicity -Continue baclofen and provost give -Continue scheduled Senokot for constipation prophylaxis -Continue PT/OT/SLP.  Will need to continue these therapies once discharged to SNF and SLP  services will primarily be focused on Passy-Muir valve training -5/2, CIR MD reevaluated patient " Charlene [name] unfortunately has not made any meaningful functional gains over the last month. She remains most appropriate for SNF level care".   Neck Contracture/torticollis --Continue baclofen as above -- continue PT/OT and other supportive care  Hypertensive emergency/Malignant HTN -Had been prescribed carvedilol and hydrochlorothiazide prior to admission but was unable to afford -Continue Norvasc, Lopressor and hydralazine.   Acute hypoxemic respiratory failure secondary to CVA requiring mechanical ventilation and tracheostomy tube -Stable, #6 cuffless trach with collar -Per trach team: Has significant altered mentation and inability to manage oral secretions at present time is not a safe candidate for decannulation.  Discussed this with family as major obstacle in discharging to home.  This will also decrease options for SNF placement.  Dysphagia -Goal is to prevent malnutrition.  Patient remained stable on tube feedings per PEG along with Prosource liquid and free water to maintain adequate hydration.  Also will continue to follow CBGs and provide SSI if indicated     Other problems: Right upper extremity  thrombophlebitis - [name] -> age indeterminate SVT of right cephalic vein  ESBL positive Enterobacter UTI -Has completed 5 days of meropenem -Contact isolation during hospitalization -continue Oxybutynin suspected bladder spasms  Bacterial Conjunctivitis /periorbital cellulitis -Resolved  Chronic pansinusitis/recurrent fevers 2/2 aspiration PNA;History of Serratia Pneumonia  Hospital Acquired Pneumonia:  -Completed several courses of antibiotic -Follow-up imaging including sinus CT consistent with chronic but stable and resolving sinusitis   Anemia -Stable  HLD -Continue Lipitor      Data Reviewed: Basic Metabolic Panel: Recent Labs  Lab 11/15/20 0150  NA 139  K  3.8  CL 99  CO2 30  GLUCOSE 103*  BUN 16  CREATININE 0.50  CALCIUM 9.3   Liver Function Tests: Recent Labs  Lab 11/15/20 0150  AST 25  ALT 41  ALKPHOS 140*  BILITOT 0.3  PROT 6.9  ALBUMIN 2.7*   CBC: Recent Labs  Lab 11/15/20 0150  WBC 5.7  HGB 10.1*  HCT 32.0*  MCV 80.0  PLT 390    CBG: Recent Labs  Lab 11/17/20 1256 11/17/20 1643 11/17/20 2012 11/18/20 0041 11/18/20 0327  GLUCAP 122* 97 128* 144* 147*     Studies: No results found.  Scheduled Meds: . amLODipine  10 mg Per Tube Daily  . atorvastatin  40 mg Per Tube Daily  . baclofen  7.5 mg Per Tube 2 times per day  . chlorhexidine gluconate (MEDLINE KIT)  15 mL Mouth Rinse BID  . Chlorhexidine Gluconate Cloth  6 each Topical Daily  . enoxaparin (LOVENOX) injection  40 mg Subcutaneous Q24H  . feeding supplement (PROSource TF)  45 mL Per Tube Daily  . free water  100 mL Per Tube Q4H  . hydrALAZINE  50 mg Per Tube Q8H  . insulin aspart  0-9 Units Subcutaneous Q4H  . mouth rinse  15 mL Mouth Rinse 5 X Daily  . metoprolol tartrate  100 mg Per Tube BID  . modafinil  200 mg Per Tube Daily  . oxybutynin  5 mg Per Tube BID  . pantoprazole sodium  40 mg Per Tube BID  . sennosides  5 mL Per Tube QHS   Continuous Infusions: . sodium chloride Stopped (10/26/20 1445)  . 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   Palliative care.    PCCM continues to follow.  No decannulation.   Procedures:  Tracheostomy  PEG tube  EEG  Echocardiogram  Antibiotics: Vancomycin 2/12-2/15 Maxipime 2/12-2/15 Ancef 2/19-2/27 Vancomycin 2/20 x 1 dose Maxipime 2/27- 3/5 Vancomycin 3/1-3/3 Ancef 3/6-3/14 Cipro 3/20 4-3/30 Unasyn 3/30-4/4 Rocephin 4/11-4/12 Meropenem 4/12-4/17   Time spent: 25 minutes    Erin Hearing ANP  [facility] 7 am - 330 pm/M-F for direct patient care and secure chat Please refer to [place] for contact info 83  Days

## 2020-11-18 NOTE — TOC Progression Note (Addendum)
Transition of Care [facility]) - Progression Note    Patient Details  Name: JOELL BUERGER MRN: [id number] Date of Birth: [birth year]  Transition of Care [facility]) CM/SW Contact  Janae Bridgeman, RN Phone Number: 11/18/2020, 8:06 AM  Clinical Narrative:    CM and MSW with DTP Team will meet with the patient's husband today at 1030 to discuss transitions of care to SNF facility for post-hospitalization care.  CM spoke with [place] Desanctis, CM at [facility] in [place], [place] and the facility has offered a SNF bed to the patient for admission.   Edwin Dada, MSW spoke with Florence Canner, CM at [facility] and the facility is unable to offer a bed to the patient at this time with no SNF beds open at the facility and patient has pending Medicaid.  The facility does not have a waiting list to place patient, but the family can call the facility at a later date once the patient's Medicaid is approved.  CM and MSW will update the family of this information since the family would prefer to have the patient placed at a [facility] facility.  CM and MSW will continue to follow the patient for SNF placement.   Expected Discharge Plan: Skilled Nursing Facility Barriers to Discharge: Continued Medical Work up  Expected Discharge Plan and Services Expected Discharge Plan: Skilled Nursing Facility In-house Referral: Clinical Social Work Discharge Planning Services: CM Consult Post Acute Care Choice: Skilled Nursing Facility Living arrangements for the past 2 months: Single Family Home                                       Social Determinants of Health (SDOH) Interventions    Readmission Risk Interventions Readmission Risk Prevention Plan 09/29/2020  Transportation Screening Complete  PCP or Specialist Appt within 3-5 Days Complete  HRI or Home Care Consult Complete  Social Work Consult for Recovery Care Planning/Counseling Complete  Palliative Care Screening Complete  Medication Review  Oceanographer) Complete  Some recent data might be hidden

## 2020-11-19 LAB — GLUCOSE, CAPILLARY
Glucose-Capillary: 140 mg/dL — ABNORMAL HIGH (ref 70–99)
Glucose-Capillary: 151 mg/dL — ABNORMAL HIGH (ref 70–99)
Glucose-Capillary: 123 mg/dL — ABNORMAL HIGH (ref 70–99)
Glucose-Capillary: 122 mg/dL — ABNORMAL HIGH (ref 70–99)
Glucose-Capillary: 117 mg/dL — ABNORMAL HIGH (ref 70–99)

## 2020-11-19 NOTE — Progress Notes (Addendum)
12pm: CSW received return call from [name] who is requesting a family conference occur before a decision is made regarding disposition. [name] to obtain time from family member's and will notify CSW.  9am: CSW attempted to reach patient's husband [name] to follow up regarding bed offer for [facility] in [place] - no answer, a voicemail was left requesting a return call.  Edwin Dada, MSW, LCSW Transitions of Care  Clinical Social Worker II [phone]

## 2020-11-19 NOTE — Progress Notes (Signed)
[facility] PROGRESS NOTE  Charlene [name] ZOX:[id number] DOB: [birth year] DOA: 08/26/2020 PCP: Patient, [name] (Inactive)    October 08, 2020     10/16/2020                        10/16/2020     10/18/2020                        10/20/2020    Status: Remains inpatient appropriate because:Altered mental status, Unsafe d/c plan, IV treatments appropriate due to intensity of illness or inability to take PO and Inpatient level of care appropriate due to severity of illness   Dispo: The patient is from: Home              Anticipated d/c is to: SNF vs brain injury recovery rehab-family does not wish to place patient in SNF              Patient currently is medically stable to d/c.   Difficult to place patient Yes   Level of care: Progressive  Code Status: Full Family Communication: Checked information: [name] [phone]); [name] [phone]) is her POA and legal contact.  5/4: Updated husband [name] at bedside with assistance of CM and LCSW regarding readiness for discharge and availability of SNF bed.  Explained they will rationale for limited availability of SNF beds that can accept Charlene [name] noting primary barrier is her current trach tube.  Abdomen was made aware that he and her family need to give [name] a decision regarding disposition by 5/5.  We understand that they will probably go this weekend to review the facility. DVT prophylaxis: enoxaparin (LOVENOX) injection 40 mg Start: 09/01/20 1600 SCD's Start: 08/27/20 0026   Vaccination status: Unvaccinated-have discussed both with patient's mother and with [name].  He wishes to speak to the rest of the family before making a decision but is leaning towards [name] vaccine.  Micro Data:  2/9 COVID + Influenza negative 2/10 MRSA PCR negative 2/12 BCx 2 > no growth  2/19 BC 1/2 +Stap EPI, 1/2 no growth  10/07/2020 sputum positive for Serratia marcescens 4/8 Enterobacter cloaca + urine culture MDR sensitive  only to imipenem and gentamicin   Antimicrobials:  Cefepime 2/12 > 2/15 Vanc 2/12 > 2/15 10/08/2020 ciprofloxacin>>3/30 Unasyn 3/30 >> 4/02 Rocephin 4/11 > 4/12 Meropenem 4/12>4/17   HPI: 55 yo F with 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  Subsequently on 2/10, she developed obtundation and respiratory arrest leading to intubation.  She has remained persistently obtunded, now has a trach and a PEG.  Hospital course complicated by persistent fever, Serratia pneumonia and now pre-orbital cellulitis  As of 3/25 with adjustments in hypertonicity medications patient began to improve regarding hypertonicity.  Eventually started on provigil with improvements in alertness.  Evaluated by rehab physician who recommended increasing provigil dose further.  Subsequently patient has become more alert.  Most days she is able to track and with encouragement able to follow simple commands especially since partial obstruction glasses placed.  See progress note dictated 4/27 for expanded details.   2/09 Admit for [place] 2/10 EVD placed, later intubated due to obtundation and respiratory arrest 2/11 MRI Brain, hypertonic saline d/c'd, EVD not working.  Husband did not consent to PICC  2/13 EVD removed 2/17 [place] discussion with family, PMT 2/24 Bedside trach and PEG 3/1 Transferred to  [facility]  Subjective: Awake.  Not tracking.  Objective: Vitals:   11/19/20 0423 11/19/20 0735  BP:  130/77  Pulse: 80 90  Resp: 17 17  Temp:  99 F (37.2 C)  SpO2: 100% 100%   No intake or output data in the 24 hours ending 11/19/20 0817 Filed Weights   11/14/20 0453 11/15/20 0500 11/16/20 0500  Weight: 72 kg 73.2 kg 72.9 kg    Exam:  Constitutional: Appears comfortable and in no acute distress Respiratory: #6.0 cuffless trach, trach collar with FiO2 21% at 5L,O2 sats 97 to 99%.  Anterior lung sounds are clear.  No increased work of  breathing Cardiovascular: No tachycardia, regular pulse, normotensive Abdomen:  PEG tube-LBM 5/4, soft and nontender on palpation.  Normoactive bowel sounds.  Tube feedings infusing. Musculoskeletal: Bilateral lower extremities noted with atrophy of thigh and calf muscles Neurologic: CN 2-12 intact except notable with left ocular disconjugate gaze with medial right downward gaze.  Not tracking today with right eye.  It is noted that partial-occlusion glasses are off. Psych: Awake.  Bland affect.  Unable to accurately assess otherwise given nonverbal state.  PMV was in place and patient did grunt twice   Assessment/Plan: Acute problems: Multiple acute strokes  Right Thalamus ICH with IVH  Obstructive Hydrocephalus/Acute on persistent metabolic Encephalopathy/brain injury related hypertonicity -Continue baclofen and Provigil -Continue scheduled Senokot  -Continue PT/OT/SLP.  Will need to continue these therapies once discharged to SNF; SLP services will primarily be focused on Passy-Muir valve training -5/2, CIR MD reevaluated patient " Charlene [name] unfortunately has not made any meaningful functional gains over the last month. She remains most appropriate for SNF level care".   Neck Contracture/torticollis -- continue PT/OT and other supportive care  Hypertensive emergency/Malignant HTN -Had been prescribed carvedilol and hydrochlorothiazide prior to admission but was unable to afford -Continue Norvasc, Lopressor and hydralazine.   Acute hypoxemic respiratory failure secondary to CVA requiring mechanical ventilation and tracheostomy tube - #6 cuffless trach with collar -Per trach team: Has significant altered mentation and inability to manage oral secretions at present time is not a safe candidate for decannulation.  Discussed this with family as major obstacle in discharging to home.  This will also decrease options for SNF placement.  Dysphagia -Goal is to prevent malnutrition.   Patient remained stable on tube feedings per PEG along with Prosource liquid and free water to maintain adequate hydration.  Also will continue to follow CBGs and provide SSI if indicated     Other problems: Right upper extremity thrombophlebitis - [name] -> age indeterminate SVT of right cephalic vein  ESBL positive Enterobacter UTI -Has completed 5 days of meropenem -Contact isolation during hospitalization -continue Oxybutynin suspected bladder spasms  Bacterial Conjunctivitis /periorbital cellulitis -Resolved  Chronic pansinusitis/recurrent fevers 2/2 aspiration PNA;History of Serratia Pneumonia  Hospital Acquired Pneumonia:  -Completed several courses of antibiotic -Follow-up imaging including sinus CT consistent with chronic but stable and resolving sinusitis   Anemia -Stable  HLD -Continue Lipitor      Data Reviewed: Basic Metabolic Panel: Recent Labs  Lab 11/15/20 0150  NA 139  K 3.8  CL 99  CO2 30  GLUCOSE 103*  BUN 16  CREATININE 0.50  CALCIUM 9.3   Liver Function Tests: Recent Labs  Lab 11/15/20 0150  AST 25  ALT 41  ALKPHOS 140*  BILITOT 0.3  PROT 6.9  ALBUMIN 2.7*   CBC: Recent Labs  Lab 11/15/20 0150  WBC 5.7  HGB 10.1*  HCT 32.0*  MCV 80.0  PLT 390    CBG: Recent Labs  Lab 11/18/20 1555 11/18/20 2003 11/18/20 2343 11/19/20 0422 11/19/20 0744  GLUCAP 90 128* 149* 140* 151*     Studies: No results found.  Scheduled Meds: . amLODipine  10 mg Per Tube Daily  . atorvastatin  40 mg Per Tube Daily  . baclofen  7.5 mg Per Tube 2 times per day  . chlorhexidine gluconate (MEDLINE KIT)  15 mL Mouth Rinse BID  . Chlorhexidine Gluconate Cloth  6 each Topical Daily  . enoxaparin (LOVENOX) injection  40 mg Subcutaneous Q24H  . feeding supplement (PROSource TF)  45 mL Per Tube Daily  . free water  100 mL Per Tube Q4H  . hydrALAZINE  50 mg Per Tube Q8H  . insulin aspart  0-9 Units Subcutaneous Q4H  . mouth rinse  15 mL Mouth  Rinse 5 X Daily  . metoprolol tartrate  100 mg Per Tube BID  . modafinil  200 mg Per Tube Daily  . oxybutynin  5 mg Per Tube BID  . pantoprazole sodium  40 mg Per Tube BID  . sennosides  5 mL Per Tube QHS   Continuous Infusions: . sodium chloride Stopped (10/26/20 1445)  . feeding supplement (OSMOLITE 1.2 CAL) 1,000 mL (11/17/20 1017)    Principal Problem:   ICH (intracerebral hemorrhage) ([place]) Active Problems:   Intraparenchymal hemorrhage of brain (HCC)   Acute hypoxemic respiratory failure (HCC)   Fever   Endotracheal tube present   Status post tracheostomy ([place])   Palliative care by specialist   Muscle hypertonicity   Oropharyngeal dysphagia   Brain injury with loss of consciousness ([place])   Poor prognosis   Aspiration pneumonia of right lower lobe (HCC)   Gram-negative infection   Abnormal urinalysis   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   Acute cystitis without hematuria   Consultants:  Neurology   Neurosurgery.   Palliative care.    PCCM continues to follow.  No decannulation.   Procedures:  Tracheostomy  PEG tube  EEG  Echocardiogram  Antibiotics: Vancomycin 2/12-2/15 Maxipime 2/12-2/15 Ancef 2/19-2/27 Vancomycin 2/20 x 1 dose Maxipime 2/27- 3/5 Vancomycin 3/1-3/3 Ancef 3/6-3/14 Cipro 3/20 4-3/30 Unasyn 3/30-4/4 Rocephin 4/11-4/12 Meropenem 4/12-4/17   Time spent: 25 minutes    Erin Hearing ANP  [facility] 7 am - 330 pm/M-F for direct patient care and secure chat Please refer to [place] for contact info 84  Days

## 2020-11-19 NOTE — TOC Progression Note (Signed)
Transition of Care [facility]) - Progression Note    Patient Details  Name: CORTNEE STEINMILLER MRN: [id number] Date of Birth: Jan 13, 1966  Transition of Care [facility]) CM/SW Contact  Janae Bridgeman, RN Phone Number: 11/19/2020, 3:17 PM  Clinical Narrative:    Case management spoke with the patient's husband, [name] on the phone regarding transitions of care.  [name] understand that [facility] in [place], [place] has offered a bed to the patient for admission.  [name] states that he feels pressured to make a decision on accepting the offer for SNf placement to the facility.  I gave the patient information and support and asked that the patient's family arrange to have a Zoom conversation or family meeting in person to answer questions in regards to her placement.  The husband, [name], states that he will return my call this after to arrange a meeting time scheduled for tomorrow.    CM and MSW to continue to follow the patient for SNF placement.   Expected Discharge Plan: Skilled Nursing Facility Barriers to Discharge: Continued Medical Work up  Expected Discharge Plan and Services Expected Discharge Plan: Skilled Nursing Facility In-house Referral: Clinical Social Work Discharge Planning Services: CM Consult Post Acute Care Choice: Skilled Nursing Facility Living arrangements for the past 2 months: Single Family Home                                       Social Determinants of Health (SDOH) Interventions    Readmission Risk Interventions Readmission Risk Prevention Plan 09/29/2020  Transportation Screening Complete  PCP or Specialist Appt within 3-5 Days Complete  HRI or Home Care Consult Complete  Social Work Consult for Recovery Care Planning/Counseling Complete  Palliative Care Screening Complete  Medication Review Oceanographer) Complete  Some recent data might be hidden

## 2020-11-19 NOTE — Progress Notes (Signed)
Physical Therapy Treatment Patient Details Name: Charlene [name] MRN: [id number] DOB: 10-Nov-1965 Today's Date: 11/19/2020    History of Present Illness Pt is 55 y/o female presents [facility] 2/9 with L sided weakness and headaches. PMH includes anxiety, HTN. 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 s/p Right frontal ventricular catheter placement on 2/10, stopped functioning on 2/11.  MRI 2/11 shows right greater than left mesial temporal lobes / parahippocampal cortex, bilateral basal ganglia, and possibly the upper pons that are concerning for acute infarcts, most likely secondary to mass effect from the hemorrhage. ETT 2/10-present.  Pt with trach and PEG on 09/10/20. Hospital course complicated by persistent fever, Serratia pneumonia and now pre-orbital cellulitis.    PT Comments    Focus of session was EOB activity for facilitation of cervical extension to neutral with pec stretch, shoulder flexion with manual facilitation of upward rotation of scapula bilaterally, and trunk rotation R and L. Pt intermittently tracking throughout session and not demonstrating any meaningful movement of the LE's. Pt did grip a picture on command today (delayed) but would not let it go with several commands and assistance to release grasp. Will look in to whether unit accepts tilt beds, as this may be a good way to increase weight bearing and put pt in a different position to interact with her environment. Will continue to follow.    Follow Up Recommendations  SNF     Equipment Recommendations  Hospital bed    Recommendations for Other Services Rehab consult     Precautions / Restrictions Precautions Precautions: Fall Precaution Comments: peg and trach Required Braces or Orthoses: Other Brace Other Brace: RLE soft knee brace and LUE soft elbow brace (wearing  schedule in RN orders); bil PRAFOs Restrictions Weight Bearing Restrictions: No    Mobility  Bed Mobility Overal bed mobility: Needs Assistance Bed Mobility: Rolling Rolling: Total assist Sidelying to sit: Total assist;+2 for physical assistance   Sit to supine: Total assist;+2 for physical assistance        Transfers                 General transfer comment: Not tested this session.  Ambulation/Gait             General Gait Details: non ambulatory   Stairs             Wheelchair Mobility    Modified Rankin (Stroke Patients Only) Modified Rankin (Stroke Patients Only) Pre-Morbid Rankin Score: No symptoms Modified Rankin: Severe disability     Balance Overall balance assessment: Needs assistance Sitting-balance support: No upper extremity supported;Feet supported Sitting balance-Leahy Scale: Poor Sitting balance - Comments: Pt required min A and at times mod A for EOB sitting Postural control: Right lateral lean     Standing balance comment: unable                            Cognition Arousal/Alertness: Awake/alert Behavior During Therapy: Flat affect Overall Cognitive Status: Impaired/Different from baseline Area of Impairment: Attention;Following commands;Problem solving                   Current Attention Level: Focused   Following Commands: Follows one step commands inconsistently;Follows one step commands with increased time     Problem Solving: Slow processing;Decreased initiation;Requires verbal cues;Requires tactile cues General  Comments: Pt fixated and tracked to midline ~50% of the time with picture. Pt grabbed the picture once on command however would not let go of the picture with several commands and assist. Mom present and pt appeared to turn head and look at mom when she was talking upon initial transition to sitting.      Exercises General Exercises - Upper Extremity Shoulder Flexion: PROM;Left;10  reps;Supine Shoulder ABduction: PROM;Left;10 reps;Supine Elbow Flexion: PROM;Left;10 reps;Supine Elbow Extension: PROM;Left;10 reps;Supine General Exercises - Lower Extremity Long Arc Quad: PROM;Both;Seated;5 reps Other Exercises Other Exercises: Sitting EOB, cervical extension to neutral with pec stretch Other Exercises: Sitting EOB, x10 passive shoulder flexion with manual facilitation of upward rotation of scapula bilaterally. Other Exercises: Sitting EOB, facilitated trunk rotation R and L with 20-30 second h old x2 each direction    General Comments        Pertinent Vitals/Pain Pain Assessment: Faces Faces Pain Scale: Hurts even more Pain Location: R knee with extension Pain Descriptors / Indicators: Grimacing;Moaning Pain Intervention(s): Monitored during session;Repositioned    Home Living Family/patient expects to be discharged to:: Skilled nursing facility Living Arrangements: Spouse/significant other;Children Available Help at Discharge: Family                Prior Function Level of Independence: Independent      Comments: unsure of home set up, no family present in room and pt not verbalizing   PT Goals (current goals can now be found in the care plan section) Acute Rehab PT Goals Patient Stated Goal: Pt unable to state PT Goal Formulation: Patient unable to participate in goal setting Time For Goal Achievement: 11/30/20 Potential to Achieve Goals: Poor Progress towards PT goals: Progressing toward goals    Frequency    Min 2X/week      PT Plan Current plan remains appropriate    Co-evaluation              AM-PAC PT "6 Clicks" Mobility   Outcome Measure  Help needed turning from your back to your side while in a flat bed without using bedrails?: Total Help needed moving from lying on your back to sitting on the side of a flat bed without using bedrails?: Total Help needed moving to and from a bed to a chair (including a wheelchair)?:  Total Help needed standing up from a chair using your arms (e.g., wheelchair or bedside chair)?: Total Help needed to walk in hospital room?: Total Help needed climbing 3-5 steps with a railing? : Total 6 Click Score: 6    End of Session Equipment Utilized During Treatment: Gait belt Activity Tolerance: Patient tolerated treatment well Patient left: with call bell/phone within reach;in chair Nurse Communication: Mobility status;Need for lift equipment PT Visit Diagnosis: Hemiplegia and hemiparesis;Other symptoms and signs involving the nervous system (R29.898);Other abnormalities of gait and mobility (R26.89) Hemiplegia - Right/Left: Right Hemiplegia - dominant/non-dominant: Dominant Hemiplegia - caused by: Cerebral infarction;Nontraumatic intracerebral hemorrhage;Other Nontraumatic intracranial hemorrhage     Time: 7939-0300 PT Time Calculation (min) (ACUTE ONLY): 47 min  Charges:  $Therapeutic Activity: 8-22 mins $Neuromuscular Re-education: 23-37 mins                     Charlene [name], PT, DPT Acute Rehabilitation Services Pager: [phone] Office: [phone]    Charlene [name] 11/19/2020, 3:10 PM

## 2020-11-20 LAB — GLUCOSE, CAPILLARY
Glucose-Capillary: 115 mg/dL — ABNORMAL HIGH (ref 70–99)
Glucose-Capillary: 121 mg/dL — ABNORMAL HIGH (ref 70–99)
Glucose-Capillary: 121 mg/dL — ABNORMAL HIGH (ref 70–99)
Glucose-Capillary: 134 mg/dL — ABNORMAL HIGH (ref 70–99)
Glucose-Capillary: 135 mg/dL — ABNORMAL HIGH (ref 70–99)
Glucose-Capillary: 146 mg/dL — ABNORMAL HIGH (ref 70–99)
Glucose-Capillary: 148 mg/dL — ABNORMAL HIGH (ref 70–99)
Glucose-Capillary: 164 mg/dL — ABNORMAL HIGH (ref 70–99)

## 2020-11-20 NOTE — Plan of Care (Signed)
Patient not following commands.  Problem: Education: Goal: Knowledge of disease or condition will improve Outcome: Not Progressing Goal: Knowledge of patient specific risk factors addressed and post discharge goals established will improve Outcome: Not Progressing   Problem: Coping: Goal: Will identify appropriate support needs Outcome: Not Progressing   Problem: Health Behavior/Discharge Planning: Goal: Ability to manage health-related needs will improve Outcome: Not Progressing   Problem: Self-Care: Goal: Verbalization of feelings and concerns over difficulty with self-care will improve Outcome: Not Progressing Goal: Ability to communicate needs accurately will improve Outcome: Not Progressing   Problem: Nutrition: Goal: Risk of aspiration will decrease Outcome: Not Progressing   Problem: Intracerebral Hemorrhage Tissue Perfusion: Goal: Complications of Intracerebral Hemorrhage will be minimized Outcome: Not Progressing   Problem: Education: Goal: Knowledge of General Education information will improve Description: Including pain rating scale, medication(s)/side effects and non-pharmacologic comfort measures Outcome: Not Progressing   Problem: Health Behavior/Discharge Planning: Goal: Ability to manage health-related needs will improve Outcome: Not Progressing   Problem: Clinical Measurements: Goal: Ability to maintain clinical measurements within normal limits will improve Outcome: Not Progressing Goal: Will remain free from infection Outcome: Not Progressing Goal: Diagnostic test results will improve Outcome: Not Progressing Goal: Respiratory complications will improve Outcome: Not Progressing Goal: Cardiovascular complication will be avoided Outcome: Not Progressing   Problem: Coping: Goal: Level of anxiety will decrease Outcome: Not Progressing   Problem: Elimination: Goal: Will not experience complications related to bowel motility Outcome: Not  Progressing Goal: Will not experience complications related to urinary retention Outcome: Not Progressing   Problem: Pain Managment: Goal: General experience of comfort will improve Outcome: Not Progressing   Problem: Skin Integrity: Goal: Risk for impaired skin integrity will decrease Outcome: Not Progressing   Problem: Intracerebral Hemorrhage Tissue Perfusion: Goal: Complications of Intracerebral Hemorrhage will be minimized Outcome: Not Progressing   Problem: Ischemic Stroke/TIA Tissue Perfusion: Goal: Complications of ischemic stroke/TIA will be minimized Outcome: Not Progressing

## 2020-11-20 NOTE — TOC Progression Note (Addendum)
[facility]) - Progression Note    Patient Details  Name: Charlene [name] MRN: [id number] Date of Birth: 10-28-1965  Transition of Care [facility]) CM/SW [place], RN Phone Number: 11/20/2020, 1:18 PM  Clinical Narrative:    Case management met with the patient and family in the family conference room on 3W to discuss transitions of care for the patient.  Adult family members included spouse, mother, daughters, father, and brother in law present at the bedside to ask questions regarding SNf placement in [place], [place] at [facility].  The family at this time have requested to make a decision regarding SNF placement in [place], [place] at [facility] versus taking the patient home with family.  Erin Hearing, NP, [name], myself, and Barbette Or were all present to answer questions regarding patient's health condition and disposition to [facility] facility in [place], [place] if family agrees.  The patient's family was asked to make a decision regarding disposition by Monday, 11/23/2020.  The patient is medically stable to discharge and the patient's family is aware.  CM spoke with charge RN on [address] [place] and she is going to call the patient's daughter, Charlene [name] and discuss family visitation for Sunday, 11/22/2020.   Expected Discharge Plan: Skilled Nursing Facility Barriers to Discharge: Continued Medical Work up  Expected Discharge Plan and Services Expected Discharge Plan: [place] In-house Referral: Clinical Social Work Discharge Planning Services: CM Consult Post Acute Care Choice: [place] arrangements for the past 2 months: Single Family Home                                       Social Determinants of Health (SDOH) Interventions    Readmission Risk Interventions Readmission Risk Prevention Plan 09/29/2020  Transportation Screening Complete  PCP or Specialist Appt within 3-5 Days Complete  HRI or  [place] Complete  Social Work Consult for [place] Planning/Counseling Complete  Palliative Care Screening Complete  Medication Review Press photographer) Complete  Some recent data might be hidden

## 2020-11-20 NOTE — Progress Notes (Addendum)
[facility] PROGRESS NOTE  Charlene [name] DOB: 1965-10-17 DOA: 08/26/2020 PCP: Patient, [name] (Inactive)    October 08, 2020     10/16/2020                        10/16/2020     10/18/2020                        10/20/2020    Status: Remains inpatient appropriate because:Altered mental status, Unsafe d/c plan, IV treatments appropriate due to intensity of illness or inability to take PO and Inpatient level of care appropriate due to severity of illness   Dispo: The patient is from: Home              Anticipated d/c is to: SNF vs brain injury recovery rehab-family does not wish to place patient in SNF              Patient currently is medically stable to d/c.   Difficult to place patient Yes   Level of care: Progressive  Code Status: Full Family Communication: Checked information: Daughter Charlene [name] [phone]); husband Charlene [name] [phone]) is her POA and legal contact.  5/4: Updated husband Charlene [name] at bedside with assistance of CM and LCSW regarding readiness for discharge and availability of SNF bed.  Explained they will rationale for limited availability of SNF beds that can accept Charlene [name] noting primary barrier is her current trach tube.  Abdomen was made aware that he and her family need to give [name] a decision regarding disposition by 5/5.  We understand that they will probably go this weekend to review the facility. DVT prophylaxis: Chronically bedbound.  5/6: We will discontinue prophylactic Lovenox   Vaccination status: Unvaccinated-have discussed both with patient's mother and with husband Charlene [name].  He wishes to speak to the rest of the family before making a decision but is leaning towards [name] vaccine.  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  Subsequently on 2/10, she developed obtundation and respiratory arrest leading to intubation.  She has remained persistently obtunded, now has a trach and a PEG.  Hospital course complicated by persistent fever, Serratia pneumonia and now pre-orbital cellulitis  As of 3/25 with adjustments in hypertonicity medications patient began to improve regarding hypertonicity.  Eventually started on provigil with improvements in alertness.  Evaluated by rehab physician who recommended increasing provigil dose further.  Subsequently patient has become more alert.  Most days she is able to track and with encouragement able to follow simple commands especially since partial obstruction glasses placed.  See progress note dictated 4/27 for expanded details.   2/09 Admit for [place] 2/10 EVD placed, later intubated due to obtundation and respiratory arrest 2/11 MRI Brain, hypertonic saline d/c'd, EVD not working.  Husband did not consent to PICC  2/13 EVD removed 2/17 [place] discussion with family, PMT 2/24 Bedside trach and PEG 3/1 Transferred to [facility]  Subjective:  Awake, no family at bedside.  Attempted to engage patient both verbally and with tactile stimulation from left side.  No attempt to track right eye past midline and no attempt to turn head to face this examiner.  I did speak openly about difficulty with discharge planning and unfortunate distance to available facilities with patient's nurse initially.  When I looked over at the patient she appeared to be looking at me and listening to what I was saying.  Later her gaze went back to focusing out toward the window. I also spoke with patient directly  regarding her difficult discharge plan.  12 pm: Extensive conversation held with multiple family members including patient's husband in conference room on 3 W.  Multiple issues discussed including rationale for hospitalization stating patient no longer meets inpatient criteria/hospitalization criteria, rationale why not eligible for decannulation, etc. I also reached out to the trach team to have them contact patient's husband to discuss trach management and rationale why not eligible for decannulation.  Objective: Vitals:   11/20/20 0427 11/20/20 0537  BP:  (!) 155/97  Pulse: 84 91  Resp: 17 20  Temp:    SpO2: 100% 100%    Intake/Output Summary (Last 24 hours) at 11/20/2020 0744 Last data filed at 11/20/2020 0600 Gross per 24 hour  Intake 10552.67 ml  Output --  Net 10552.67 ml   Filed Weights   11/14/20 0453 11/15/20 0500 11/16/20 0500  Weight: 72 kg 73.2 kg 72.9 kg    Exam:  Constitutional: Appears comfortable and in no acute distress Respiratory: #6.0 cuffless trach, trach collar with FiO2 21% at 5L,O2 sats 97 to 99%.  Lungs are clear.  No tracheal secretions. Cardiovascular: No tachycardia, pulses regular, no JVD, no peripheral edema Abdomen:  PEG tube-LBM 5/4, abdomen soft and nontender to palpation with normoactive bowel sounds Musculoskeletal: Bilateral lower extremities noted with atrophy of thigh and calf muscles Neurologic: CN 2-12 intact except notable with left ocular disconjugate gaze with medial right downward gaze.  Inconsistently appears to track with right eye.  Patient does have partial obstruction glasses in place to minimize left eye nystagmus. Psych: Unable to accurately assess given nonverbal state.   Assessment/Plan: Acute problems: Multiple acute strokes  Right Thalamus ICH with IVH  Obstructive Hydrocephalus/Acute on persistent metabolic Encephalopathy/brain injury related hypertonicity -Continue baclofen and Provigil -Continue scheduled Senokot   -Continue PT/OT/SLP.  Will need to continue these therapies once discharged to SNF; SLP services will primarily be focused on Passy-Muir valve training -5/2, CIR MD reevaluated patient " Mrs. Gambrell unfortunately has not made any meaningful functional gains over the last month. She remains most appropriate for SNF level care".   Neck Contracture/torticollis -- continue PT/OT and other supportive care  Hypertensive emergency/Malignant HTN -Due to financial constraints patient was not taking carvedilol and hydrochlorothiazide prior to admission -Continue Norvasc, Lopressor and hydralazine.   Acute hypoxemic respiratory failure secondary to CVA requiring mechanical ventilation and tracheostomy tube - #6 cuffless trach with collar -Per trach team: Has significant altered mentation and inability to manage oral secretions at present time is not a safe candidate for decannulation.  Discussed this with family as major obstacle in discharging to home.  This will also decrease options for SNF placement.  Dysphagia -Continue tube feedings, Prosource liquid, free water -Follow CBGs and provide SSI      Other problems: Right upper extremity thrombophlebitis - [name] -> age indeterminate SVT of right cephalic vein  ESBL positive Enterobacter UTI -Has completed 5 days of  meropenem -Contact isolation during hospitalization -continue Oxybutynin suspected bladder spasms  Bacterial Conjunctivitis /periorbital cellulitis -Resolved  Chronic pansinusitis/recurrent fevers 2/2 aspiration PNA;History of Serratia Pneumonia  Hospital Acquired Pneumonia:  -Completed several courses of antibiotic -Follow-up imaging including sinus CT consistent with chronic but stable and resolving sinusitis   Anemia -Stable  HLD -Continue Lipitor      Data Reviewed: Basic Metabolic Panel: Recent Labs  Lab 11/15/20 0150  NA 139  K 3.8  CL 99  CO2 30  GLUCOSE 103*  BUN 16  CREATININE 0.50  CALCIUM 9.3    Liver Function Tests: Recent Labs  Lab 11/15/20 0150  AST 25  ALT 41  ALKPHOS 140*  BILITOT 0.3  PROT 6.9  ALBUMIN 2.7*   CBC: Recent Labs  Lab 11/15/20 0150  WBC 5.7  HGB 10.1*  HCT 32.0*  MCV 80.0  PLT 390    CBG: Recent Labs  Lab 11/19/20 1612 11/19/20 2007 11/20/20 0009 11/20/20 0425 11/20/20 0737  GLUCAP 122* 117* 164* 135* 134*     Studies: No results found.  Scheduled Meds: . amLODipine  10 mg Per Tube Daily  . atorvastatin  40 mg Per Tube Daily  . baclofen  7.5 mg Per Tube 2 times per day  . chlorhexidine gluconate (MEDLINE KIT)  15 mL Mouth Rinse BID  . Chlorhexidine Gluconate Cloth  6 each Topical Daily  . enoxaparin (LOVENOX) injection  40 mg Subcutaneous Q24H  . feeding supplement (PROSource TF)  45 mL Per Tube Daily  . free water  100 mL Per Tube Q4H  . hydrALAZINE  50 mg Per Tube Q8H  . insulin aspart  0-9 Units Subcutaneous Q4H  . mouth rinse  15 mL Mouth Rinse 5 X Daily  . metoprolol tartrate  100 mg Per Tube BID  . modafinil  200 mg Per Tube Daily  . oxybutynin  5 mg Per Tube BID  . pantoprazole sodium  40 mg Per Tube BID  . sennosides  5 mL Per Tube QHS   Continuous Infusions: . sodium chloride Stopped (10/26/20 1445)  . 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   Palliative care.    PCCM continues to follow.  No decannulation.   Procedures:  Tracheostomy  PEG tube  EEG  Echocardiogram  Antibiotics: Vancomycin 2/12-2/15 Maxipime 2/12-2/15 Ancef 2/19-2/27 Vancomycin 2/20 x 1 dose Maxipime 2/27- 3/5 Vancomycin 3/1-3/3 Ancef 3/6-3/14 Cipro 3/20 4-3/30 Unasyn 3/30-4/4 Rocephin 4/11-4/12 Meropenem 4/12-4/17   Time spent: 25 minutes    Erin Hearing ANP  [facility] 7 am - 330 pm/M-F for direct patient care and secure chat Please refer to [place] for contact info 85  Days

## 2020-11-20 NOTE — TOC Progression Note (Addendum)
Transition of Care [facility]) - Progression Note    Patient Details  Name: STACIE TEMPLIN MRN: [id number] Date of Birth: 1965/11/07  [facility] CM/SW Contact  Janae Bridgeman, RN Phone Number: 11/20/2020, 7:33 AM  Clinical Narrative:    11/19/2020 1500 - CM spoke again with [name], husband to listen to his concerns about patient's bed offer at [facility] in [place], [place].  [name] states that the biggest concern is that the patient's only bed offer is more than 2.5 hours away from home and the family will be unable to afford to visit the patient at this distance.  [name] states that he is trying to get family members together on the phone or in person to have a family meeting to meet with [facility] staff and medical physician to discuss transitions of care options and allow the family to make a decision about disposition.  I spoke with Junious Silk, NP and she is aware and will follow up with the patient's husband and have the attending physician speak with the patient's husband as well.  Cm called and left a message with Irving Burton, CM at [facility] to speak with the facility about options to place the patient in mind for future admission once the patient's Medicaid is approved.    CM and MSW will continue to follow the patient for discharge planning and communication with the family.   Expected Discharge Plan: Skilled Nursing Facility Barriers to Discharge: Continued Medical Work up  Expected Discharge Plan and Services Expected Discharge Plan: Skilled Nursing Facility In-house Referral: Clinical Social Work Discharge Planning Services: CM Consult Post Acute Care Choice: Skilled Nursing Facility Living arrangements for the past 2 months: Single Family Home                                       Social Determinants of Health (SDOH) Interventions    Readmission Risk Interventions Readmission Risk Prevention Plan 09/29/2020  Transportation  Screening Complete  PCP or Specialist Appt within 3-5 Days Complete  HRI or Home Care Consult Complete  Social Work Consult for Recovery Care Planning/Counseling Complete  Palliative Care Screening Complete  Medication Review [employer]) Complete  Some recent data might be hidden

## 2020-11-21 LAB — GLUCOSE, CAPILLARY
Glucose-Capillary: 101 mg/dL — ABNORMAL HIGH (ref 70–99)
Glucose-Capillary: 110 mg/dL — ABNORMAL HIGH (ref 70–99)
Glucose-Capillary: 121 mg/dL — ABNORMAL HIGH (ref 70–99)
Glucose-Capillary: 122 mg/dL — ABNORMAL HIGH (ref 70–99)
Glucose-Capillary: 134 mg/dL — ABNORMAL HIGH (ref 70–99)
Glucose-Capillary: 138 mg/dL — ABNORMAL HIGH (ref 70–99)

## 2020-11-21 NOTE — Plan of Care (Signed)
  Problem: Nutrition: Goal: Risk of aspiration will decrease Outcome: Progressing   Problem: Intracerebral Hemorrhage Tissue Perfusion: Goal: Complications of Intracerebral Hemorrhage will be minimized Outcome: Progressing   Problem: Clinical Measurements: Goal: Ability to maintain clinical measurements within normal limits will improve Outcome: Progressing Goal: Will remain free from infection Outcome: Progressing Goal: Diagnostic test results will improve Outcome: Progressing Goal: Respiratory complications will improve Outcome: Progressing Goal: Cardiovascular complication will be avoided Outcome: Progressing   Problem: Coping: Goal: Level of anxiety will decrease Outcome: Progressing   Problem: Elimination: Goal: Will not experience complications related to bowel motility Outcome: Progressing Goal: Will not experience complications related to urinary retention Outcome: Progressing   Problem: Pain Managment: Goal: General experience of comfort will improve Outcome: Progressing   Problem: Skin Integrity: Goal: Risk for impaired skin integrity will decrease Outcome: Progressing   Problem: Intracerebral Hemorrhage Tissue Perfusion: Goal: Complications of Intracerebral Hemorrhage will be minimized Outcome: Progressing   Problem: Ischemic Stroke/TIA Tissue Perfusion: Goal: Complications of ischemic stroke/TIA will be minimized Outcome: Progressing

## 2020-11-21 NOTE — Progress Notes (Signed)
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  Patient has remained persistently obtunded, now has a trach and a PEG.  Hospital course complicated by persistent fever, Serratia pneumonia and periorbital cellulitis.  2/09 Admit for [place] 2/10 EVD placed, later intubated due to obtundation and respiratory arrest 2/11 MRI Brain, hypertonic saline d/c'd, EVD not working. Husband did not consent to PICC  2/13 EVD removed 2/17 [place] discussion with family, PMT 2/24 Bedside trach and PEG 3/1 Transferred to [facility]  Subjective   Patient seen and examined, she is nonverbal.   Assessment/Plan:     1. Multiple acute strokes-right thalamus intracranial hemorrhage/obstructive hydrocephalus-persistent metabolic encephalopathy.  Continue baclofen, Provigil.  PT/OT.  Awaiting placement at skilled nursing facility. 2. Hypertensive emergency-due to financial constraints patient was not taking Coreg and hydrochlorothiazide prior to admission.  Continue Norvasc, Lopressor, hydralazine. 3. Acute hypoxemic respiratory failure-sec continue CVA requiring mechanical ventilation and tracheostomy tube.  Not safe for decannulation as she has significant altered mental status with inability to manage oral secretions. 4. Dysphagia-continue tube feeding.   Scheduled medications:   . amLODipine  10 mg Per Tube Daily  . atorvastatin  40 mg Per Tube Daily  . baclofen  7.5 mg Per Tube 2 times per day  . chlorhexidine gluconate (MEDLINE KIT)  15 mL Mouth Rinse BID  . Chlorhexidine Gluconate Cloth  6 each Topical Daily  . feeding supplement (PROSource TF)  45 mL Per Tube Daily   . free water  100 mL Per Tube Q4H  . hydrALAZINE  50 mg Per Tube Q8H  . insulin aspart  0-9 Units Subcutaneous Q4H  . mouth rinse  15 mL Mouth Rinse 5 X Daily  . metoprolol tartrate  100 mg Per Tube BID  . modafinil  200 mg Per Tube Daily  . oxybutynin  5 mg Per Tube BID  . pantoprazole sodium  40 mg Per Tube BID  . sennosides  5 mL Per Tube QHS         Data Reviewed:   CBG:  Recent Labs  Lab 11/20/20 2032 11/20/20 2309 11/21/20 0359 11/21/20 0734 11/21/20 1216  GLUCAP 115* 146* 138* 122* 134*    SpO2: 99 % O2 Flow Rate (L/min): 5 L/min FiO2 (%): 21 %    Vitals:   11/21/20 0735 11/21/20 0838 11/21/20 1215 11/21/20 1526  BP: 133/86  120/73   Pulse: 92 93 89 89  Resp: 16  16 (!) 22  Temp: 99 F (37.2 C)  99.9 F (37.7 C)   TempSrc: Axillary  Axillary   SpO2: 99%  99%   Weight:      Height:         Intake/Output Summary (Last 24 hours) at 11/21/2020 1633 Last data filed at 11/21/2020 0600 Gross per 24 hour  Intake 1600 ml  Output --  Net 1600 ml    05/05 1901 - 05/07 0700 In: 12472.7  Out: -   Filed Weights   11/14/20 0453 11/15/20 0500 11/16/20 0500  Weight: 72 kg 73.2 kg 72.9 kg    CBC:  Recent Labs  Lab 11/15/20 0150  WBC 5.7  HGB 10.1*  HCT 32.0*  PLT 390  MCV 80.0  MCH 25.3*  MCHC 31.6  RDW 15.6*    Complete metabolic panel:  Recent Labs  Lab 11/15/20 0150  NA 139  K 3.8  CL 99  CO2 30  GLUCOSE 103*  BUN 16  CREATININE 0.50  CALCIUM 9.3  AST 25  ALT 41  ALKPHOS 140*  BILITOT 0.3  ALBUMIN 2.7*    No results for input(s): LIPASE, AMYLASE in the last 168 hours.  No results for input(s): CRP, DDIMER, BNP, PROCALCITON, SARSCOV2NAA in the last 168 hours.  Invalid input(s): LACTICACID  ------------------------------------------------------------------------------------------------------------------ No results for input(s): CHOL, HDL, LDLCALC, TRIG, CHOLHDL, LDLDIRECT in the last 72 hours.  Lab Results  Component  Value Date   HGBA1C 6.0 (H) 08/27/2020   ------------------------------------------------------------------------------------------------------------------ No results for input(s): TSH, T4TOTAL, T3FREE, THYROIDAB in the last 72 hours.  Invalid input(s): FREET3 ------------------------------------------------------------------------------------------------------------------ No results for input(s): VITAMINB12, FOLATE, FERRITIN, TIBC, IRON, RETICCTPCT in the last 72 hours.  Coagulation profile No results for input(s): INR, PROTIME in the last 168 hours. No results for input(s): DDIMER in the last 72 hours.  Cardiac Enzymes No results for input(s): CKTOTAL, CKMB, CKMBINDEX, TROPONINI in the last 168 hours.  ------------------------------------------------------------------------------------------------------------------ No results found for: BNP   Antibiotics: Anti-infectives (From admission, onward)   Start     Dose/Rate Route Frequency Ordered Stop   10/27/20 0845  meropenem (MERREM) 1 g in sodium chloride 0.9 % 100 mL IVPB  Status:  Discontinued        1 g 200 mL/hr over 30 Minutes Intravenous Every 8 hours 10/27/20 0752 11/01/20 0720   10/26/20 1145  cefTRIAXone (ROCEPHIN) 1 g in sodium chloride 0.9 % 100 mL IVPB  Status:  Discontinued        1 g 200 mL/hr over 30 Minutes Intravenous Every 24 hours 10/26/20 1056 10/27/20 0752   10/14/20 1815  Ampicillin-Sulbactam (UNASYN) 3 g in sodium chloride 0.9 % 100 mL IVPB        3 g 200 mL/hr over 30 Minutes Intravenous Every 6 hours 10/14/20 1754 10/19/20 1340   10/08/20 0845  ciprofloxacin (CIPRO) tablet 500 mg  Status:  Discontinued        500 mg Per Tube 2 times daily 10/08/20 0754 10/14/20 1731   09/21/20 1545  ceFAZolin (ANCEF) IVPB 1 g/50 mL premix        1 g 100 mL/hr over 30 Minutes Intravenous Every 8 hours 09/21/20 1457 09/28/20 0647   09/20/20 0600  ceFAZolin (ANCEF) IVPB 1 g/50 mL premix  Status:  Discontinued        1  g 100 mL/hr over 30 Minutes Intravenous Every 8 hours 09/14/20 0903 09/14/20 0904   09/16/20 1300  vancomycin (VANCOREADY) IVPB 1250 mg/250 mL  Status:  Discontinued        1,250 mg 166.7 mL/hr over 90 Minutes Intravenous Every 24 hours 09/15/20 1232 09/17/20 0956   09/15/20 1230  vancomycin (VANCOREADY) IVPB 1250 mg/250 mL        1,250 mg 166.7 mL/hr over 90 Minutes Intravenous  Once 09/15/20 1131 09/15/20 1402   09/13/20 1400  ceFEPIme (MAXIPIME) 2 g in sodium chloride 0.9 % 100 mL IVPB        2 g 200 mL/hr over 30 Minutes Intravenous Every 8 hours 09/13/20 1341 09/19/20 2208   09/06/20 0600  vancomycin (VANCOREADY) IVPB 1250 mg/250 mL        1,250 mg 166.7 mL/hr over 90 Minutes Intravenous  Once 09/06/20 0541 09/06/20 0739   09/05/20 1400  ceFAZolin (ANCEF)  IVPB 1 g/50 mL premix  Status:  Discontinued        1 g 100 mL/hr over 30 Minutes Intravenous Every 8 hours 09/05/20 1120 09/13/20 1341   08/30/20 0600  vancomycin (VANCOREADY) IVPB 500 mg/100 mL  Status:  Discontinued        500 mg 100 mL/hr over 60 Minutes Intravenous Every 12 hours 08/29/20 1554 09/01/20 1115   08/29/20 1700  ceFEPIme (MAXIPIME) 2 g in sodium chloride 0.9 % 100 mL IVPB  Status:  Discontinued        2 g 200 mL/hr over 30 Minutes Intravenous Every 12 hours 08/29/20 1554 09/01/20 1115   08/29/20 1645  vancomycin (VANCOREADY) IVPB 1250 mg/250 mL        1,250 mg 166.7 mL/hr over 90 Minutes Intravenous  Once 08/29/20 1554 08/29/20 1820       Radiology Reports  No results found.    DVT prophylaxis: SCDs  Code Status: Full code  Family Communication: No family at bedside   Consultants:  Neurology  Neurosurgery  Palliative care  PCCM  Procedures:  Tracheostomy  PEG tube  EEG  Echocardiogram     Objective    Physical Examination:    General: Appears in no acute distress  Cardiovascular: S1-S2, regular, no murmur auscultated  Respiratory: Clear to auscultation  bilaterally  Abdomen: PEG tube in place, soft, nontender to palpation  Extremities: No edema the lower extremities  Neurologic: ,Alert,  nonverbal, does not follow commands   Status is: Inpatient  Dispo: The patient is from: Home              Anticipated d/c is to: Skilled nursing facility              Anticipated d/c date is: To be decided              Patient currently stable for discharge  Barrier to discharge-awaiting skilled nursing facility placement  COVID-19 Labs  No results for input(s): DDIMER, FERRITIN, LDH, CRP in the last 72 hours.  Lab Results  Component Value Date   [place] NEGATIVE 08/26/2020    Microbiology  No results found for this or any previous visit (from the past 240 hour(s)).           Oswald Hillock   [facility] If 7PM-7AM, please contact night-coverage at [email or web address], Office  [phone]   11/21/2020, 4:33 PM  LOS: 86 days

## 2020-11-22 LAB — GLUCOSE, CAPILLARY
Glucose-Capillary: 105 mg/dL — ABNORMAL HIGH (ref 70–99)
Glucose-Capillary: 120 mg/dL — ABNORMAL HIGH (ref 70–99)
Glucose-Capillary: 155 mg/dL — ABNORMAL HIGH (ref 70–99)
Glucose-Capillary: 138 mg/dL — ABNORMAL HIGH (ref 70–99)
Glucose-Capillary: 132 mg/dL — ABNORMAL HIGH (ref 70–99)
Glucose-Capillary: 112 mg/dL — ABNORMAL HIGH (ref 70–99)

## 2020-11-22 NOTE — Procedures (Signed)
Tracheostomy Change Note  Patient Details:   Name: Charlene [name] DOB: [birth year] MRN: [id number]    Airway Documentation:     Evaluation  O2 sats: stable throughout Complications: No apparent complications Patient did tolerate procedure well. Bilateral Breath Sounds: Clear,Diminished   Pt's trach was downsized to #4 shiley cuffless flex without complications. Color change was noted post insertion. No blood noted. Pt is stable at this time. RT will continue to monitor pt.     Merlene Laughter 11/22/2020, 2:08 PM

## 2020-11-22 NOTE — Progress Notes (Signed)
[facility]  PROGRESS NOTE  JANILAH Charlene [name] IRC:[id number] DOB: [birth year] DOA: 08/26/2020 PCP: Charlene [name], [name] (Inactive)   Brief HPI:   55 year old female with hypertensive right thalamic/basal ganglia ICH with resultant hydrocephalus prompting external ventricular drain placement on 2/10, subsequently on 2/10 she developed obtundation and respiratory arrest leading to intubation.  Charlene [name] has remained persistently obtunded, now has a trach and a PEG.  Hospital course complicated by persistent fever, Serratia pneumonia and periorbital cellulitis.  2/09 Admit for [place] 2/10 EVD placed, later intubated due to obtundation and respiratory arrest 2/11 MRI Brain, hypertonic saline d/c'd, EVD not working. Husband did not consent to PICC  2/13 EVD removed 2/17 [place] discussion with family, PMT 2/24 Bedside trach and PEG 3/1 Transferred to [facility]  Subjective   Charlene [name] seen and examined, no new complaints.   Assessment/Plan:     1. Multiple acute strokes-right thalamus intracranial hemorrhage/obstructive hydrocephalus-persistent metabolic encephalopathy.  Continue baclofen, Provigil.  PT/OT.  Awaiting placement at skilled nursing facility. 2. Hypertensive emergency-due to financial constraints Charlene [name] was not taking Coreg and hydrochlorothiazide prior to admission.  Continue Norvasc, Lopressor, hydralazine. 3. Acute hypoxemic respiratory failure-sec continue CVA requiring mechanical ventilation and tracheostomy tube.  Not safe for decannulation as she has significant altered mental status with inability to manage oral secretions. 4. Dysphagia-continue tube feeding.   Scheduled medications:   . amLODipine  10 mg Per Tube Daily  . atorvastatin  40 mg Per Tube Daily  . baclofen  7.5 mg Per Tube 2 times per day  . chlorhexidine gluconate (MEDLINE KIT)  15 mL Mouth Rinse BID  . Chlorhexidine Gluconate Cloth  6 each Topical Daily  . feeding supplement (PROSource TF)  45 mL Per Tube Daily   . free water  100 mL Per Tube Q4H  . hydrALAZINE  50 mg Per Tube Q8H  . insulin aspart  0-9 Units Subcutaneous Q4H  . mouth rinse  15 mL Mouth Rinse 5 X Daily  . metoprolol tartrate  100 mg Per Tube BID  . modafinil  200 mg Per Tube Daily  . oxybutynin  5 mg Per Tube BID  . pantoprazole sodium  40 mg Per Tube BID  . sennosides  5 mL Per Tube QHS         Data Reviewed:   CBG:  Recent Labs  Lab 11/21/20 2007 11/21/20 2346 11/22/20 0314 11/22/20 0740 11/22/20 1213  GLUCAP 110* 121* 105* 120* 155*    SpO2: 100 % O2 Flow Rate (L/min): 5 L/min FiO2 (%): 21 %    Vitals:   11/22/20 0500 11/22/20 0742 11/22/20 0755 11/22/20 1214  BP:  136/85 136/85 (!) 155/99  Pulse:  86  96  Resp:  15  20  Temp:  (!) 96 F (35.6 C)  (!) 97 F (36.1 C)  TempSrc:  Oral  Oral  SpO2:  99%  100%  Weight: 76 kg     Height:         Intake/Output Summary (Last 24 hours) at 11/22/2020 1354 Last data filed at 11/21/2020 1700 Gross per 24 hour  Intake 905 ml  Output --  Net 905 ml    05/06 1901 - 05/08 0700 In: 1865  Out: -   Filed Weights   11/15/20 0500 11/16/20 0500 11/22/20 0500  Weight: 73.2 kg 72.9 kg 76 kg    CBC:  No results for input(s): WBC, HGB, HCT, PLT, MCV, MCH, MCHC, RDW, LYMPHSABS, MONOABS, EOSABS, BASOSABS, BANDABS in the last 168  hours.  Invalid input(s): NEUTRABS, BANDSABD  Complete metabolic panel:  No results for input(s): NA, K, CL, CO2, GLUCOSE, BUN, CREATININE, CALCIUM, AST, ALT, ALKPHOS, BILITOT, ALBUMIN, GLUCOSE, MG, CRP, DDIMER, PROCALCITON, LATICACIDVEN, INR, TSH, CORTISOL, HGBA1C, AMMONIA, BNP in the last 168 hours.  Invalid input(s): GFRCGP, PHOSPHOROUS  No results for input(s): LIPASE, AMYLASE in the last 168 hours.  No results for input(s): CRP, DDIMER, BNP, PROCALCITON, SARSCOV2NAA in the last 168 hours.  Invalid input(s): LACTICACID   ------------------------------------------------------------------------------------------------------------------ No results for input(s): CHOL, HDL, LDLCALC, TRIG, CHOLHDL, LDLDIRECT in the last 72 hours.  Lab Results  Component Value Date   HGBA1C 6.0 (H) 08/27/2020   ------------------------------------------------------------------------------------------------------------------ No results for input(s): TSH, T4TOTAL, T3FREE, THYROIDAB in the last 72 hours.  Invalid input(s): FREET3 ------------------------------------------------------------------------------------------------------------------ No results for input(s): VITAMINB12, FOLATE, FERRITIN, TIBC, IRON, RETICCTPCT in the last 72 hours.  Coagulation profile No results for input(s): INR, PROTIME in the last 168 hours. No results for input(s): DDIMER in the last 72 hours.  Cardiac Enzymes No results for input(s): CKTOTAL, CKMB, CKMBINDEX, TROPONINI in the last 168 hours.  ------------------------------------------------------------------------------------------------------------------ No results found for: BNP   Antibiotics: Anti-infectives (From admission, onward)   Start     Dose/Rate Route Frequency Ordered Stop   10/27/20 0845  meropenem (MERREM) 1 g in sodium chloride 0.9 % 100 mL IVPB  Status:  Discontinued        1 g 200 mL/hr over 30 Minutes Intravenous Every 8 hours 10/27/20 0752 11/01/20 0720   10/26/20 1145  cefTRIAXone (ROCEPHIN) 1 g in sodium chloride 0.9 % 100 mL IVPB  Status:  Discontinued        1 g 200 mL/hr over 30 Minutes Intravenous Every 24 hours 10/26/20 1056 10/27/20 0752   10/14/20 1815  Ampicillin-Sulbactam (UNASYN) 3 g in sodium chloride 0.9 % 100 mL IVPB        3 g 200 mL/hr over 30 Minutes Intravenous Every 6 hours 10/14/20 1754 10/19/20 1340   10/08/20 0845  ciprofloxacin (CIPRO) tablet 500 mg  Status:  Discontinued        500 mg Per Tube 2 times daily 10/08/20 0754 10/14/20 1731    09/21/20 1545  ceFAZolin (ANCEF) IVPB 1 g/50 mL premix        1 g 100 mL/hr over 30 Minutes Intravenous Every 8 hours 09/21/20 1457 09/28/20 0647   09/20/20 0600  ceFAZolin (ANCEF) IVPB 1 g/50 mL premix  Status:  Discontinued        1 g 100 mL/hr over 30 Minutes Intravenous Every 8 hours 09/14/20 0903 09/14/20 0904   09/16/20 1300  vancomycin (VANCOREADY) IVPB 1250 mg/250 mL  Status:  Discontinued        1,250 mg 166.7 mL/hr over 90 Minutes Intravenous Every 24 hours 09/15/20 1232 09/17/20 0956   09/15/20 1230  vancomycin (VANCOREADY) IVPB 1250 mg/250 mL        1,250 mg 166.7 mL/hr over 90 Minutes Intravenous  Once 09/15/20 1131 09/15/20 1402   09/13/20 1400  ceFEPIme (MAXIPIME) 2 g in sodium chloride 0.9 % 100 mL IVPB        2 g 200 mL/hr over 30 Minutes Intravenous Every 8 hours 09/13/20 1341 09/19/20 2208   09/06/20 0600  vancomycin (VANCOREADY) IVPB 1250 mg/250 mL        1,250 mg 166.7 mL/hr over 90 Minutes Intravenous  Once 09/06/20 0541 09/06/20 0739   09/05/20 1400  ceFAZolin (ANCEF) IVPB 1 g/50 mL premix  Status:  Discontinued  1 g 100 mL/hr over 30 Minutes Intravenous Every 8 hours 09/05/20 1120 09/13/20 1341   08/30/20 0600  vancomycin (VANCOREADY) IVPB 500 mg/100 mL  Status:  Discontinued        500 mg 100 mL/hr over 60 Minutes Intravenous Every 12 hours 08/29/20 1554 09/01/20 1115   08/29/20 1700  ceFEPIme (MAXIPIME) 2 g in sodium chloride 0.9 % 100 mL IVPB  Status:  Discontinued        2 g 200 mL/hr over 30 Minutes Intravenous Every 12 hours 08/29/20 1554 09/01/20 1115   08/29/20 1645  vancomycin (VANCOREADY) IVPB 1250 mg/250 mL        1,250 mg 166.7 mL/hr over 90 Minutes Intravenous  Once 08/29/20 1554 08/29/20 1820       Radiology Reports  No results found.    DVT prophylaxis: SCDs  Code Status: Full code  Family Communication: No family at bedside   Consultants:  Neurology  Neurosurgery  Palliative  care  PCCM  Procedures:  Tracheostomy  PEG tube  EEG  Echocardiogram     Objective    Physical Examination:    General-appears in no acute distress  Heart-S1-S2, regular, no murmur auscultated  Lungs-clear to auscultation bilaterally, no wheezing or crackles auscultated  Abdomen-soft, nontender, no organomegaly  Extremities-no edema in the lower extremities  Neuro-alert, nonverbal, does not follow commands   Status is: Inpatient  Dispo: The Charlene [name] is from: Home              Anticipated d/c is to: Skilled nursing facility              Anticipated d/c date is: To be decided              Charlene [name] currently stable for discharge  Barrier to discharge-awaiting skilled nursing facility placement  COVID-19 Labs  No results for input(s): DDIMER, FERRITIN, LDH, CRP in the last 72 hours.  Lab Results  Component Value Date   [place] NEGATIVE 08/26/2020    Microbiology  No results found for this or any previous visit (from the past 240 hour(s)).           Oswald Hillock   [facility] If 7PM-7AM, please contact night-coverage at [email or web address], Office  [phone]   11/22/2020, 1:54 PM  LOS: 87 days

## 2020-11-22 NOTE — Plan of Care (Signed)
  Problem: Education: Goal: Knowledge of disease or condition will improve Outcome: Progressing Goal: Knowledge of patient specific risk factors addressed and post discharge goals established will improve Outcome: Progressing   Problem: Coping: Goal: Will identify appropriate support needs Outcome: Progressing   Problem: Health Behavior/Discharge Planning: Goal: Ability to manage health-related needs will improve Outcome: Progressing   Problem: Self-Care: Goal: Verbalization of feelings and concerns over difficulty with self-care will improve Outcome: Progressing Goal: Ability to communicate needs accurately will improve Outcome: Progressing   Problem: Nutrition: Goal: Risk of aspiration will decrease Outcome: Progressing   Problem: Intracerebral Hemorrhage Tissue Perfusion: Goal: Complications of Intracerebral Hemorrhage will be minimized Outcome: Progressing   Problem: Education: Goal: Knowledge of General Education information will improve Description: Including pain rating scale, medication(s)/side effects and non-pharmacologic comfort measures Outcome: Progressing   Problem: Clinical Measurements: Goal: Ability to maintain clinical measurements within normal limits will improve Outcome: Progressing Goal: Will remain free from infection Outcome: Progressing Goal: Diagnostic test results will improve Outcome: Progressing Goal: Respiratory complications will improve Outcome: Progressing Goal: Cardiovascular complication will be avoided Outcome: Progressing   Problem: Coping: Goal: Level of anxiety will decrease Outcome: Progressing   Problem: Elimination: Goal: Will not experience complications related to bowel motility Outcome: Progressing Goal: Will not experience complications related to urinary retention Outcome: Progressing   Problem: Pain Managment: Goal: General experience of comfort will improve Outcome: Progressing   Problem: Skin  Integrity: Goal: Risk for impaired skin integrity will decrease Outcome: Progressing   Problem: Intracerebral Hemorrhage Tissue Perfusion: Goal: Complications of Intracerebral Hemorrhage will be minimized Outcome: Progressing   Problem: Ischemic Stroke/TIA Tissue Perfusion: Goal: Complications of ischemic stroke/TIA will be minimized Outcome: Progressing

## 2020-11-23 LAB — GLUCOSE, CAPILLARY
Glucose-Capillary: 128 mg/dL — ABNORMAL HIGH (ref 70–99)
Glucose-Capillary: 110 mg/dL — ABNORMAL HIGH (ref 70–99)
Glucose-Capillary: 139 mg/dL — ABNORMAL HIGH (ref 70–99)
Glucose-Capillary: 117 mg/dL — ABNORMAL HIGH (ref 70–99)
Glucose-Capillary: 104 mg/dL — ABNORMAL HIGH (ref 70–99)
Glucose-Capillary: 129 mg/dL — ABNORMAL HIGH (ref 70–99)
Glucose-Capillary: 150 mg/dL — ABNORMAL HIGH (ref 70–99)

## 2020-11-23 NOTE — Progress Notes (Signed)
NAME:  Charlene [name], MRN:  [id number], DOB:  1965-11-22, LOS: 88 ADMISSION DATE:  08/26/2020, CONSULTATION DATE:  2/10 REFERRING MD:  Dr. Roda Shutters, CHIEF COMPLAINT:  ICH   Brief History:  56 year old female admitted with hypertensive R thalamic/basal ganglia ICH. Resultant obstructive hydrocephalus prompting EVD placement 2/10. Subsequently, 2/10 she had acute mental status change to obtundation and respiratory arrest resulting in intubation. Trach and Peg 2/24. Trach change to #6 cuffless on 3/11.   Past Medical History:  HTN, medication noncompliance  Significant Hospital Events:  2/09 Admit for ICH 2/10 EVD placed, later intubated due to obtundation and respiratory arrest 2/11 MRI Brain, hypertonic saline d/c'd, EVD not working.  Husband did not consent to PICC  2/13 EVD removed 2/17 GOC discussion with family, PMT 3/11 trach changed to Shiley 6 uncuffed 4/18 28% ATC / 5L  5/8 downsized trach to 4 Consults:  Stroke PCCM Neurosurgery  Procedures:  ETT 2/10 >> 09/10/2020 EVD 2/10 > non functioning 2/11 at 1500; clamped 2/12 am > 2/13 Trach (Dr. Denese Killings) 2/24 >>  PEG placement (Dr. Sheliah Hatch) 2/24 >>   Significant Diagnostic Tests:  CT Head 2/9 >> Acute hemorrhage with the epicenter in the right thalamus. Measurement is approximately 3.2 x 2.1 x 2.5 cm (volume 8.8 cm^3). Intraventricular penetration with blood filling the third and fourth ventricles and nearly filling the right lateral ventricle. No hydrocephalus. CTA Head/Neck 2/10 >> Unchanged size of intraparenchymal hemorrhage centered in the right thalamus and basal ganglia with extension into the right lateral ventricle. 2. No intracranial arterial occlusion or high-grade stenosis. No dissection, aneurysm or hemodynamically significant stenosis of the carotid or vertebral arteries. CT Head 2/10 >> Stable parenchymal hemorrhage in the right thalamus and basal ganglia with intraventricular extension as described. No new focal  abnormality is noted. CT Head 2/10 (post intubation) >> redemonstrated intraparenchymal hemorrhage centered within the R thalamus/basal ganglia extending into the cerebral peduncle/midbrain with associated intraventricular extension and hemorrhage in the R lateral ventricle and fourth ventricle, overall not significantly changed from prior; stable mass effect, 84mm of R to L midline shift, postsurgical changes of ventriculostomy TTE 2/10 >> LVEF 60 to 65%, LV has normal function, no regional wall motion abnormalities, mild concentric LVH.  RV systolic function is normal, normal pulmonary artery systolic pressure.  MRA head 2/11 >>Negative intracranial MR angiography. No evidence of aneurysm or high flow vascular malformation to explain the intraparenchymal hemorrhage  MRI brain 2/11 >> Similar intraparenchymal hemorrhage centered within the right thalamus and basal ganglia extending into the right midbrain with intraventricular extension, effacement of the prepontine cistern and similar 4 mm of leftward midline shift; separate areas of restricted diffusion and edema involving the R > L mesial temporal lobes/parahippocampal cortex, bilateral basal ganglia, and possibly the upper pons that are c/f acute infarcts, most likely secondary to mass effect from hemorrhage. MRI brain 2/19 >> intraventricular clot has decreased in size but mid-brain parenchymal clot has not and there are more ischemic infarcts in the surrounding brain.  CT Head WO Contrast 09/07/20>> Expected evolution of intracranial hemorrhage since 08/30/2020 head CT. Multiple areas of low attenuation corresponding to some of the infarcts seen on prior MRI. Ventricle caliber similar to prior MRI   Micro Data:  2/9 COVID + Influenza negative 2/10 MRSA PCR negative 2/12 BCx 2 > no growth  2/19 BC 1/2 +Stap EPI, 1/2 no growth  10/07/2020 sputum positive for Serratia marcescens 4/8 UC >> enterobacter cloacae >> S-gent, imipenem. Otherwise resistant.  Antimicrobials:  Cefepime 2/12 > 2/15 Vanc 2/12 > 2/15 Ciprofloxacin 3/24 >> 3/30   Interim History / Subjective:  Sure looks the same.  Currently up in a chair.  No distress.  Objective   Blood pressure 114/68, pulse 87, temperature 98.2 F (36.8 C), temperature source Axillary, resp. rate 15, height 5\' 5"  (1.651 m), weight 76 kg, last menstrual period 03/03/2014, SpO2 98 %, unknown if currently breastfeeding.    FiO2 (%):  [21 %] 21 %   Intake/Output Summary (Last 24 hours) at 11/23/2020 01/23/2021 Last data filed at 11/23/2020 0600 Gross per 24 hour  Intake 1520 ml  Output no documentation  Net 1520 ml   Filed Weights   11/15/20 0500 11/16/20 0500 11/22/20 0500  Weight: 73.2 kg 72.9 kg 76 kg   Exam:  General: Appears comfortable HEENT size 4 cuffless tracheostomy in place no distress Pulmonary some scattered rhonchi no accessory use 28% aerosol trach collar Cardiac regular rate and rhythm without murmur rub or gallop Abdomen soft nontender no organomegaly PEG tube in place Extremities are warm and dry with brisk capillary refill Neuro remains unresponsive and contracted GU voids   Resolved Hospital Problem list   Hypernatremia  Obstructive hydrocephalus (resolved) Hypertensive emergency Acute hypoxemic respiratory failure secondary to CVA Serratia PNA  Bacterial Conjunctivitis   Assessment & Plan:   Tracheostomy Dependent s/p Multiple Acute Strokes c/b Intraparenchymal Hemorrhage with Right Thalamic Hemorrhage with extension into the third fourth and right lateral ventricle Torticollis/neck contracture  Dysphagia s/p PEG Anemia Sepsis 2/2 Enterobacter Urinary tract infection (new 4/8)  Pulmonary problem list:  Tracheostomy Dependence post CVA   Discussion No issues from tracheostomy standpoint.  She was downsized the other day, to a size 4 cuffless.  From her mental status she does not cough to command, she does not exhibit the ability to protect her airway, her  deconditioning has gotten worse not better.  I just do not think decannulation is a safe treatment plan unless family is prepared to transition to comfort should she deteriorate  Plan Continue routine tracheostomy care I will see again next week Consider palliative consult, for goals of care.  If the family feels strongly about decannulation we could try capping trials, but I would only agree to this with the understanding that we would not put the patient back through intubation and trach revision   6/8 ACNP-BC [facility] Pulmonary/Critical Care Pager # [phone] OR # [phone] if no answer

## 2020-11-23 NOTE — Progress Notes (Signed)
Nutrition Follow-up  DOCUMENTATION CODES:  Not applicable  INTERVENTION:  Continue Tube feeding via PEG tube: Osmolite 1.2 at 55 ml/h (1320 ml per day) 45 ml ProSource TF daily  Provides 1624 kcal, 84 gm protein, 1070 ml free water daily  100 ml free water every 4 hours    NUTRITION DIAGNOSIS:  Inadequate oral intake related to inability to eat as evidenced by NPO status. -- ongoing  GOAL:  Patient will meet greater than or equal to 90% of their needs -- Met with TF  MONITOR:  TF tolerance,Vent status  REASON FOR ASSESSMENT:  Consult,Ventilator Enteral/tube feeding initiation and management  ASSESSMENT:  Pt with PMH of HTN with medication noncompliance admitted with hypertensive thalamic ICH.  2/10 s/p EVD placement and intubation for respiratory arrest 2/13 EVD removed 2/17 palliative care consult; family requesting tx to [facility]  2/24 s/p trach and PEG  3/1 tx to [facility] 3/11 trach changed to [place] #6 cuffless 4/08 pt w/ sepsis 2/2 UTI 4/18 28% ATC/5L 5/2 CIR reevaluated pt and determined she has not made enough improvement for admit; recommended SNF 5/8 trach changed to shiley #4 cuffless   Per CCM, pt not a good candidate for decannulation "unless family is prepared to transition to comfort should she deteriorate." CCM following up weekly.   Pt continues to tolerate TF via PEG. Current regimen: Osmolite 1.2 at 55 ml/h w/ 45 ml ProSource TF daily, 169m free water flushes Q4H. Tolerating well per RN.   No UOP documented x24 hours   Medications: SSI Q4H, protonix, senokot Labs reviewed. CBGs [id number] Diet Order:   Diet Order            Diet NPO time specified  Diet effective now                 EDUCATION NEEDS:   No education needs have been identified at this time  Skin:  Skin Assessment: Reviewed RN Assessment  Last BM:  5/7  Height:   Ht Readings from Last 1 Encounters:  10/18/20 5' 5" (1.651 m)    Weight:   Wt Readings from Last 1  Encounters:  11/22/20 76 kg    Ideal Body Weight:  56.8 kg  BMI:  Body mass index is 27.88 kg/m.  Estimated Nutritional Needs:   Kcal:  1600-1800  Protein:  80-90 grams  Fluid:  >1.6L/d    ALarkin Ina MS, RD, LDN RD pager number and weekend/on-call pager number located in [place]

## 2020-11-23 NOTE — Progress Notes (Signed)
  Speech Language Pathology Treatment: Cognitive-Linquistic;Passy Muir Speaking valve  Patient Details Name: Charlene [name] MRN: [id number] DOB: 11-20-1965 Today's Date: 11/23/2020 Time: 3267-1245 SLP Time Calculation (min) (ACUTE ONLY): 15 min  Assessment / Plan / Recommendation Clinical Impression  Called by RN that pt's mom has question re: PMV. On arrival mom describing episode x 2 of air trapping. RT place valve and mom noted that pt started to frown, became restless, moving moth ("twisted") and Hr to 69. Mom reaching to remove valve when it popped off trach. Her trach is cuffless and SLP observed similar behaviors after 30 seconds to 1 min after donned. Valve doffed with audible and tactile presentation of air from trach consistently after removal. Pt appears more lethargic and mom stated she did note differences in pt today except slightly more sleepy. P reassured mom that if valve were to be placed, valve should pop off automatically with force of air to restore adequate exhlations. Recommend she not wear valve today and mom will re-attempt tomorrow am. Therapist will attempt to see tomorrow if schedule allows.    HPI   Pt is 55 y/o female presents to [facility] on 2/9 with L sided weakness and headaches. PMH includes anxiety, HTN. 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 s/p Right frontal ventricular catheter placement on 2/10, stopped functioning on 2/11.  MRI 2/11 shows right greater than left mesial temporal lobes / parahippocampal cortex, bilateral basal ganglia, and possibly the upper pons that are concerning for acute infarcts, most likely secondary to mass effect from the hemorrhage. ETT 2/10; trach/PEG 09/10/20.  Trach changed to #6 cuffless 3/11. Hospital course complicated by persistent fever,  Serratia pneumonia and pre-orbital cellulitis.      SLP  Plan  Continue with current plan of care       Recommendations         Patient may use Passy-Muir Speech Valve:  (no use rest of the day 5/9) PMSV Supervision: Full         Plan: Continue with current plan of care       GO                Royce Macadamia 11/23/2020, 1:51 PM

## 2020-11-23 NOTE — Progress Notes (Addendum)
[facility] PROGRESS NOTE  Charlene [name] RFF:[id number] DOB: 1966-01-15 DOA: 08/26/2020 PCP: Patient, [name] (Inactive)    October 08, 2020     10/16/2020                        10/16/2020     10/18/2020                        10/20/2020    Status: Remains inpatient appropriate because:Altered mental status, Unsafe d/c plan, IV treatments appropriate due to intensity of illness or inability to take PO and Inpatient level of care appropriate due to severity of illness   Dispo: The patient is from: Home              Anticipated d/c is to: SNF vs brain injury recovery rehab-family does not wish to place patient in SNF              Patient currently is medically stable to d/c.   Difficult to place patient Yes   Level of care: Progressive  Code Status: Full Family Communication: Checked information: [name] [phone]); [name] [phone]) is her POA and legal contact.  5/9 other at bedside.  Also case management updated [name] and was requesting answer regarding discharge disposition DVT prophylaxis: Chronically bedbound.  5/6: We will discontinue prophylactic Lovenox   Vaccination status: Unvaccinated-have discussed both with patient's mother and with [name].  He wishes to speak to the rest of the family before making a decision but is leaning towards [name] vaccine.  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  Subsequently  on 2/10, she developed obtundation and respiratory arrest leading to intubation.  She has remained persistently obtunded, now has a trach and a PEG.  Hospital course complicated by persistent fever, Serratia pneumonia and now pre-orbital cellulitis  As of 3/25 with adjustments in hypertonicity medications patient began to improve regarding hypertonicity.  Eventually started on provigil with improvements in alertness.  Evaluated by rehab physician who recommended increasing provigil dose further.  Subsequently patient has become more alert.  Most days she is able to track and with encouragement able to follow simple commands especially since partial obstruction glasses placed.  See progress note dictated 4/27 for expanded details.   2/09 Admit for Charlene [name] 2/10 EVD placed, later intubated due to obtundation and respiratory arrest 2/11 MRI Brain, hypertonic saline d/c'd, EVD not working.  Husband did not consent to PICC  2/13 EVD removed 2/17 [place] discussion with family, PMT 2/24 Bedside trach and PEG 3/1 Transferred to [facility]  Subjective: Awake.  Not tracking for me.  But appears to be tracking her mother.  Objective: Vitals:   11/22/20 2300 11/23/20 0100  BP: 123/76 105/62  Pulse: 75 76  Resp: 15 15  Temp: 99.3 F (37.4 C) 99.3 F (37.4 C)  SpO2: 99% 99%    Intake/Output Summary (Last 24 hours) at 11/23/2020 0752 Last  data filed at 11/23/2020 0600 Gross per 24 hour  Intake 1520 ml  Output --  Net 1520 ml   Filed Weights   11/15/20 0500 11/16/20 0500 11/22/20 0500  Weight: 73.2 kg 72.9 kg 76 kg    Exam:  Constitutional: Awake.  No apparent distress Respiratory: Downsized to #4.0 cuffless trach, trach collar with FiO2 21% at 5L,O2 sats 97 to 99%.   Cardiovascular: No tachycardia, normal heart sounds, no peripheral edema Abdomen:  PEG tube-LBM 5/4,  Musculoskeletal: Bilateral lower extremities noted with atrophy of thigh and calf muscles Neurologic: CN 2-12 intact except notable with  left ocular disconjugate gaze with medial right downward gaze.  Inconsistently appears to track with right eye.  Patient does have partial obstruction glasses in place to minimize left eye nystagmus. Psych: Eyes Open.  Unable to accurately assess orientation since she is nonverbal   Assessment/Plan: Acute problems: Multiple acute strokes  Right Thalamus ICH with IVH  Obstructive Hydrocephalus/Acute on persistent metabolic Encephalopathy/brain injury related hypertonicity -Continue baclofen and Provigil -Continue scheduled Senokot  -Continue PT/OT/SLP.  Will need to continue these therapies once discharged to SNF; SLP services will primarily be focused on Passy-Muir valve training -5/2, CIR MD reevaluated patient " Charlene [name] unfortunately has not made any meaningful functional gains over the last month. She remains most appropriate for SNF level care".   Neck Contracture/torticollis -- continue PT/OT and other supportive care  Hypertensive emergency/Malignant HTN -Due to financial constraints patient was not taking carvedilol and hydrochlorothiazide prior to admission -Continue Norvasc, Lopressor and hydralazine.   Acute hypoxemic respiratory failure secondary to CVA requiring mechanical ventilation and tracheostomy tube -Downsized to #4 cuffless trach on 5/8 -Per trach team: Has significant altered mentation and inability to manage oral secretions at present time is not a safe candidate for decannulation.  Discussed this with family as major obstacle in discharging to home.  This will also decrease options for SNF placement.  Dysphagia -Continue tube feedings, Prosource liquid, free water -Follow CBGs and provide SSI      Other problems: Right upper extremity thrombophlebitis - [name] -> age indeterminate SVT of right cephalic vein  ESBL positive Enterobacter UTI -Has completed 5 days of meropenem -Contact isolation during hospitalization -continue Oxybutynin suspected bladder  spasms  Bacterial Conjunctivitis /periorbital cellulitis -Resolved  Chronic pansinusitis/recurrent fevers 2/2 aspiration PNA;History of Serratia Pneumonia  Hospital Acquired Pneumonia:  -Completed several courses of antibiotic -Follow-up imaging including sinus CT consistent with chronic but stable and resolving sinusitis   Anemia -Stable  HLD -Continue Lipitor      Data Reviewed: Basic Metabolic Panel: No results for input(s): NA, K, CL, CO2, GLUCOSE, BUN, CREATININE, CALCIUM, MG, PHOS in the last 168 hours. Liver Function Tests: No results for input(s): AST, ALT, ALKPHOS, BILITOT, PROT, ALBUMIN in the last 168 hours. CBC: No results for input(s): WBC, NEUTROABS, HGB, HCT, MCV, PLT in the last 168 hours.  CBG: Recent Labs  Lab 11/22/20 1213 11/22/20 1533 11/22/20 2020 11/22/20 2259 11/23/20 0255  GLUCAP 155* 138* 132* 112* 128*     Studies: No results found.  Scheduled Meds: . amLODipine  10 mg Per Tube Daily  . atorvastatin  40 mg Per Tube Daily  . baclofen  7.5 mg Per Tube 2 times per day  . chlorhexidine gluconate (MEDLINE KIT)  15 mL Mouth Rinse BID  . Chlorhexidine Gluconate Cloth  6 each Topical Daily  . feeding supplement (PROSource TF)  45 mL Per Tube Daily  . free water  100 mL Per Tube Q4H  . hydrALAZINE  50 mg Per Tube Q8H  . insulin aspart  0-9 Units Subcutaneous Q4H  . mouth rinse  15 mL Mouth Rinse 5 X Daily  . metoprolol tartrate  100 mg Per Tube BID  . modafinil  200 mg Per Tube Daily  . oxybutynin  5 mg Per Tube BID  . pantoprazole sodium  40 mg Per Tube BID  . sennosides  5 mL Per Tube QHS   Continuous Infusions: . sodium chloride Stopped (10/26/20 1445)  . 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   Palliative care.    PCCM continues to follow.  No decannulation.   Procedures:  Tracheostomy  PEG tube  EEG  Echocardiogram  Antibiotics: Vancomycin 2/12-2/15 Maxipime 2/12-2/15 Ancef 2/19-2/27 Vancomycin 2/20 x 1 dose Maxipime 2/27- 3/5 Vancomycin 3/1-3/3 Ancef 3/6-3/14 Cipro 3/20 4-3/30 Unasyn 3/30-4/4 Rocephin 4/11-4/12 Meropenem 4/12-4/17   Time spent: 25 minutes    Erin Hearing ANP  [facility] 7 am - 330 pm/M-F for direct patient care and secure chat Please refer to [place] for contact info 88  Days

## 2020-11-23 NOTE — TOC Progression Note (Signed)
Transition of Care [facility]) - Progression Note    Patient Details  Name: Charlene [name] MRN: [id number] Date of Birth: Feb 01, 1966  Transition of Care [facility] CM/SW Contact  Curlene Labrum, RN Phone Number: 11/23/2020, 1:22 PM  Clinical Narrative:    Case management called and left a voicemail message with the patient's husband regarding transitions of care.  The patient has been offered an admission bed at [facility] in [place], [place].  [facility] DTP Team and Erin Hearing, NP met with the patient's family on Friday, 11/20/2020 for a family conference to answer questions regarding patient's medical progress and need for SNF placement at next level of care at [facility] facility.  The patient's family was unable to come to agreement on discharge to the facility and asked that they be given time to make a decision as a family.  [name], husband states that they would give determination of choice for acceptance of the offer by Monday, 11/23/2020.   Expected Discharge Plan: Skilled Nursing Facility Barriers to Discharge: Continued Medical Work up  Expected Discharge Plan and Services Expected Discharge Plan: [place] In-house Referral: Clinical Social Work Discharge Planning Services: CM Consult Post Acute Care Choice: [place] arrangements for the past 2 months: Single Family Home                                       Social Determinants of Health (SDOH) Interventions    Readmission Risk Interventions Readmission Risk Prevention Plan 09/29/2020  Transportation Screening Complete  PCP or Specialist Appt within 3-5 Days Complete  HRI or [place] Complete  Social Work Consult for [place] Planning/Counseling Complete  Palliative Care Screening Complete  Medication Review Press photographer) Complete  Some recent data might be hidden

## 2020-11-24 LAB — GLUCOSE, CAPILLARY
Glucose-Capillary: 113 mg/dL — ABNORMAL HIGH (ref 70–99)
Glucose-Capillary: 117 mg/dL — ABNORMAL HIGH (ref 70–99)
Glucose-Capillary: 122 mg/dL — ABNORMAL HIGH (ref 70–99)
Glucose-Capillary: 137 mg/dL — ABNORMAL HIGH (ref 70–99)
Glucose-Capillary: 145 mg/dL — ABNORMAL HIGH (ref 70–99)
Glucose-Capillary: 167 mg/dL — ABNORMAL HIGH (ref 70–99)

## 2020-11-24 MED ORDER — POLYETHYLENE GLYCOL 3350 17 G PO PACK
17.0000 g | PACK | Freq: Every day | ORAL | Status: DC
  Administered 2020-11-25: 17 g
  Filled 2020-11-24 (×2): qty 1

## 2020-11-24 NOTE — Progress Notes (Addendum)
Physical Therapy Treatment Patient Details Name: Charlene [name] MRN: [id number] DOB: 07-Feb-1966 Today's Date: 11/24/2020    History of Present Illness Pt is 55 y/o female presents to [facility] on 08/[facility]/20 with L sided weakness and headaches. 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 s/p Right frontal ventricular catheter placement on 2/10, stopped functioning on 2/11.  MRI 2/11 shows right greater than left mesial temporal lobes / parahippocampal cortex, bilateral basal ganglia, and possibly the upper pons that are concerning for acute infarcts, most likely secondary to mass effect from the hemorrhage. ETT 2/10-2/24.  Pt with trach and PEG on 09/10/20. Hospital course complicated by persistent fever, Serratia pneumonia and now pre-orbital cellulitis.   PMH includes anxiety, HTN.    PT Comments    Pt was able to sit EOB for >30 mins working on attention, command following, trunk rotation, purturbations and pre-standing weight shifts forward.  Mom and son were fully engaged in her session. I have not seen her in over a week and her tone seems better today than last session I saw her.  I am eager to get her on her feet in the safest way possible and wonder if she would tolerate the sara plus electric stander?  Might be worth trying, but would need definite two person assist. PT will continue to follow acutely for safe mobility progression.   Follow Up Recommendations  SNF     Equipment Recommendations  Hospital bed;Other (comment);Wheelchair cushion (measurements PT);Wheelchair (measurements PT) (hoyer lift, 18x18 tilt in space)    Recommendations for Other Services       Precautions / Restrictions Precautions Precautions: Fall Precaution Comments: peg and trach Required Braces or Orthoses: Other Brace Other Brace: RLE soft knee brace and LUE soft elbow brace  (wearing schedule in RN orders); bil PRAFOs    Mobility  Bed Mobility Overal bed mobility: Needs Assistance Bed Mobility: Rolling Rolling: Total assist   Supine to sit: Total assist;+2 for physical assistance Sit to supine: +2 for physical assistance;Total assist   General bed mobility comments: Two person total assist to roll bil for fixing of pads, two person total assist to come up to sitting EOB and return to supine support at trunk and legs.    Transfers Overall transfer level: Needs assistance               General transfer comment: from EOB we rocked forward using bed pad under hips onto blocked bil knees/feet.  This was in an attempt to increase WB through legs.  Ambulation/Gait                 Stairs             Wheelchair Mobility    Modified Rankin (Stroke Patients Only)       Balance Overall balance assessment: Needs assistance Sitting-balance support: Feet supported;Bilateral upper extremity supported Sitting balance-Leahy Scale: Poor Sitting balance - Comments: as good as min assist EOB when positioned optimally, worked EOB for >30 mins with assist of pt's mother and husband working on trunk purturbations, resting in bil elbows for WB through UEs, cervical midline and extension, trunk rotation, purturbations in all directions to attempt any trunkal righting or initiation.  Cognition Arousal/Alertness: Awake/alert Behavior During Therapy: Flat affect Overall Cognitive Status: Impaired/Different from baseline Area of Impairment: Orientation;Attention;Memory;Following commands;Safety/judgement;Awareness;Problem solving                 Orientation Level: Place;Time;Situation Current Attention Level: Focused Memory: Decreased short-term memory Following Commands: Follows one step commands inconsistently;Follows one step commands with increased time Safety/Judgement: Decreased awareness  of safety;Decreased awareness of deficits Awareness: Intellectual Problem Solving: Slow processing;Decreased initiation;Difficulty sequencing;Requires verbal cues;Requires tactile cues        Exercises      General Comments General comments (skin integrity, edema, etc.): VSS on RA with PMV donned.  Only vocalizations were some grunting to pain.      Pertinent Vitals/Pain Pain Assessment: Faces Faces Pain Scale: Hurts even more Pain Location: R knee with extension Pain Descriptors / Indicators: Grimacing;Moaning Pain Intervention(s): Limited activity within patient's tolerance;Monitored during session;Repositioned    Home Living                      Prior Function            PT Goals (current goals can now be found in the care plan section) Acute Rehab PT Goals Patient Stated Goal: Pt unable to state Progress towards PT goals: Progressing toward goals    Frequency    Min 2X/week      PT Plan Current plan remains appropriate    Co-evaluation              AM-PAC PT "6 Clicks" Mobility   Outcome Measure  Help needed turning from your back to your side while in a flat bed without using bedrails?: Total Help needed moving from lying on your back to sitting on the side of a flat bed without using bedrails?: Total Help needed moving to and from a bed to a chair (including a wheelchair)?: Total Help needed standing up from a chair using your arms (e.g., wheelchair or bedside chair)?: Total Help needed to walk in hospital room?: Total Help needed climbing 3-5 steps with a railing? : Total 6 Click Score: 6    End of Session   Activity Tolerance: Patient tolerated treatment well Patient left: in bed;with call bell/phone within reach;with family/visitor present   PT Visit Diagnosis: Hemiplegia and hemiparesis;Other symptoms and signs involving the nervous system (R29.898);Other abnormalities of gait and mobility (R26.89) Hemiplegia - Right/Left:  Right Hemiplegia - dominant/non-dominant: Dominant Hemiplegia - caused by: Cerebral infarction;Nontraumatic intracerebral hemorrhage;Other Nontraumatic intracranial hemorrhage     Time: 8250-0370 PT Time Calculation (min) (ACUTE ONLY): 70 min  Charges:  $Therapeutic Activity: 38-52 mins $Neuromuscular Re-education: 23-37 mins                     Corinna Capra, PT, DPT  Acute Rehabilitation [phone] pager [phone]) [phone] office

## 2020-11-24 NOTE — Progress Notes (Signed)
  Speech Language Pathology Treatment: [name] Speaking valve;Cognitive-Linquistic  Patient Details Name: Charlene [name] MRN: [id number] DOB: 1965-12-02 Today's Date: 11/24/2020 Time: 0623-7628 SLP Time Calculation (min) (ACUTE ONLY): 20 min  Assessment / Plan / Recommendation Clinical Impression  Pt seen today with PMV and for communication after episodes of poor tolerance of valve yesterday when air trapping and discomfort was evident. Upon arrival with mom at bedside pt appeared more rested and calm and able to wear PMV for around 13 minutes without change in vitals, behavior or O2 exchange from subjective view. On two occasions pt briefly moaned spontaneously (no evidence of pain or respiratory distress). Therapist attempted to elicit speech via imitation visual/auditory cues. Tactile cues to open mouth were unsuccessful after not following auditory cues to open. Constant right eye gaze and she tracked name badge to midline but could not sustain. Continue ST for facilitation of communication/cognition and swallow ability.     HPI HPI: Pt is 55 y/o female presents to [facility] on 2/9 with L sided weakness and headaches. PMH includes anxiety, HTN. 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 s/p Right frontal ventricular catheter placement on 2/10, stopped functioning on 2/11.  MRI 2/11 shows right greater than left mesial temporal lobes / parahippocampal cortex, bilateral basal ganglia, and possibly the upper pons that are concerning for acute infarcts, most likely secondary to mass effect from the hemorrhage. ETT 2/10; trach/PEG 09/10/20.  Trach changed to #6 cuffless 3/11. Hospital course complicated by persistent fever,  Serratia pneumonia and pre-orbital cellulitis.      SLP Plan  Continue with current plan of care       Recommendations         Patient  may use Passy-Muir Speech Valve: Intermittently with supervision;Caregiver trained to provide supervision;During all therapies with supervision PMSV Supervision: Full         Oral Care Recommendations: Oral care QID Follow up Recommendations: Skilled Nursing facility SLP Visit Diagnosis: Aphonia (R49.1);Cognitive communication deficit (B15.176) Plan: Continue with current plan of care                       Royce Macadamia 11/24/2020, 2:13 PM  Breck Coons Lonell Face.Ed Nurse, children's [phone] Office [phone]

## 2020-11-24 NOTE — TOC Progression Note (Addendum)
Transition of Care [facility]) - Progression Note    Patient Details  Name: Charlene [name] MRN: [id number] Date of Birth: August 09, 1965  Transition of Care [facility]) CM/SW [place], RN Phone Number: 11/24/2020, 8:18 AM  Clinical Narrative:    Case management called and left a message with Charlene [name], patient's husband to discharge planning and need for placement at SNF facility for care, since patient is medically stable for discharge.  Voicemail and text were left on the Charlene [name]' cell phone yesterday with no reply back from the husband no family members at this time.  CM and MSW will continue to follow for SNF admission.  11/24/2020 1013 - Met with the patient at the bedside for assessment and communication with family.  No family in the room at this time.  I sent the patient's husband, Charlene [name], a text message to update him on patient and request communication to facilitate patient's transfer to SNF facility in [place], [place] if family agrees and is able to sign patient into the facility.  Dr. Algis Liming is aware that I have been unable to establish communication with the patient's husband since Friday, 11/20/2020 after the family meeting and have not received communication back via phone and text.  CM and MSW will continue to follow the patient for SNF admission.  11/24/2020 1230 - CM spoke with Towanda Octave, MSW supervisor, and informed her that I have been unable to contact the patient's husband, Charlene [name], for two days in regards to making a decision regarding placement of the patient in a SNF facility in [place], [place].  I have left numerous voice messages and text messages on the phone of Charlene [name] and he has not returned my texts or calls regarding this matter.  Charlene [name] and the family are aware that the patient is medically ready for discharge and [facility] in [place], [place] have offered a bed to the patient at this time for placement.  Law enforcement  was called at [employer] and an Garment/textile technologist went to the patient's home for two occurrences today to speak with the patient's husband, Charlene [name] regarding this matter and the patient's front door at the home was open with screen door locked and no family members presented to the door to speak with the police officer for the [place].  11/24/2020 1330  CM went by the patient's room and spoke with the patient's mother, Charlene [name], and CM updated her that I have been unable to reach the patient's husband by phone for two days and that law enforcement was called for a Well-check and no family answered the door.  Charlene [name] states that she is aware that the patient has a bed offer in [place], [place] but she has not spoken with Charlene [name], husband since Friday and does not know what he decision was regarding patient's placement at the facility.    Charlene [name] states that the family is upset that no other facilities are unable to offer care in the area and she states that visitation to [place], [place] will be difficult concerning the distance and cost of gasoline and travel costs.  Charlene [name], mother states that she has not spoken with Charlene [name] since Friday with the family meeting and she will go by the patient's home this afternoon and encourage [name] to call case management back regarding the matter.  I explained to Charlene [name] and left a message of Charlene [name]' phone voicemail that if I do not receive communication from the patient's husband by tomorrow (11/25/2020)  morning that I will need to call Adult Protective Services and explain the matter to DSS for lack of communication with the patient's husband.  I explained to the patient's mother that DSS with APS would become involved with the patient and family to assist with placement of the patient for discharge from the hospital to the nursing home facility in [place], [place] at [facility]. 11/24/2020 Expected Discharge Plan: Skilled Nursing Facility Barriers to Discharge: Continued Medical Work  up  Expected Discharge Plan and Services Expected Discharge Plan: [place] In-house Referral: Clinical Social Work Discharge Planning Services: CM Consult Post Acute Care Choice: [place] arrangements for the past 2 months: Single Family Home                                       Social Determinants of Health (SDOH) Interventions    Readmission Risk Interventions Readmission Risk Prevention Plan 09/29/2020  Transportation Screening Complete  PCP or Specialist Appt within 3-5 Days Complete  HRI or [place] Complete  Social Work Consult for [place] Planning/Counseling Complete  Palliative Care Screening Complete  Medication Review Press photographer) Complete  Some recent data might be hidden

## 2020-11-24 NOTE — Progress Notes (Addendum)
[facility] PROGRESS NOTE  Charlene [name] RKY:[id number] DOB: 1965/07/26 DOA: 08/26/2020 PCP: Patient, [name] (Inactive)    October 08, 2020     10/16/2020                        10/16/2020     10/18/2020                        10/20/2020    Status: Remains inpatient appropriate because:Altered mental status, Unsafe d/c plan, IV treatments appropriate due to intensity of illness or inability to take PO and Inpatient level of care appropriate due to severity of illness   Dispo: The patient is from: Home              Anticipated d/c is to: SNF vs brain injury recovery rehab-family does not wish to place patient in SNF              Patient currently is medically stable to d/c.   Difficult to place patient Yes   Level of care: Progressive  Code Status: Full Family Communication: Checked information: Daughter Charlene [name] [phone]); husband Charlene [name] [phone]) is her POA and legal contact.  5/9 other at bedside.  Also case management updated husband Charlene [name] and was requesting answer regarding discharge disposition DVT prophylaxis: Chronically bedbound.  5/6: We will discontinue prophylactic Lovenox   Vaccination status: Unvaccinated-have discussed both with patient's mother and with husband Charlene [name].  He wishes to speak to the rest of the family before making a decision but is leaning towards [name] vaccine.  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  Subsequently  on 2/10, she developed obtundation and respiratory arrest leading to intubation.  She has remained persistently obtunded, now has a trach and a PEG.  Hospital course complicated by persistent fever, Serratia pneumonia and now pre-orbital cellulitis  As of 3/25 with adjustments in hypertonicity medications patient began to improve regarding hypertonicity.  Eventually started on provigil with improvements in alertness.  Evaluated by rehab physician who recommended increasing provigil dose further.  Subsequently patient has become more alert.  Most days she is able to track and with encouragement able to follow simple commands especially since partial obstruction glasses placed.  See progress note dictated 4/27 for expanded details.   2/09 Admit for [place] 2/10 EVD placed, later intubated due to obtundation and respiratory arrest 2/11 MRI Brain, hypertonic saline d/c'd, EVD not working.  Husband did not consent to PICC  2/13 EVD removed 2/17 [place] discussion with family, PMT 2/24 Bedside trach and PEG 3/1 Transferred to [facility]  Subjective: Patient awake and alert.  Gaze was fixated on very large Mother's Day card with multiple family member pictures that was displayed to the right of the patient unable to get patient to follow commands.  As I was preparing to leave the room and talking to her I mentioned that shortly one of her family member should be here and  finished my sentence by stating okay.  Patient nodded yes.  Objective: Vitals:   11/24/20 0311 11/24/20 0325  BP: (!) 129/95   Pulse: 87 84  Resp: 15 16  Temp: 97.7 F (36.5 C)   SpO2:  100%    Intake/Output Summary (Last 24 hours) at 11/24/2020 0752 Last data filed at 11/23/2020 2127 Gross per 24 hour  Intake 2725 ml  Output --  Net 2725 ml   Filed Weights   11/15/20 0500 11/16/20 0500 11/22/20 0500  Weight: 73.2 kg 72.9 kg 76 kg    Exam:  Constitutional: Awake, alert for her.  Does not appear to be in any distress Respiratory:  Downsized to #4.0 cuffless trach, trach collar with FiO2 21% at 5L,O2 sats 97 to 99%.  Anterior lung sounds are clear, no tracheal secretions.  Able to manage oral secretions and no drooling. Cardiovascular: Normal heart sounds, regular pulse that is not tachycardic, no peripheral edema Abdomen:  PEG tube tube feedings infusing-LBM 5/7, does not appear to be tender with palpation.  Normoactive bowel Musculoskeletal: Bilateral atrophy of thigh and calf muscles Neurologic: CN 2-12 intact except notable with left ocular disconjugate gaze with medial right downward gaze.  Also today patient with mouth open possibly attempting to smile.  Able to now determine that she does have loss of left nasolabial fold and some subtle left lip droop.  Inconsistently tracks with right eye.  Psych: Awake.  Nonverbal so unable to accurately assess orientation.   Assessment/Plan: Acute problems: Multiple acute strokes  Right Thalamus ICH with IVH  Obstructive Hydrocephalus/Acute on persistent metabolic Encephalopathy/brain injury related hypertonicity -Continue baclofen and Provigil -Continue scheduled Senokot -5/10 changed MiraLAX to scheduled daily -Continue PT/OT/SLP.  Will need to continue these therapies once discharged to SNF; SLP services will primarily be focused on Passy-Muir valve training -5/2, CIR MD reevaluated patient " [name] unfortunately has not made any meaningful functional gains over the last month. She remains most appropriate for SNF level care".   Neck Contracture/torticollis -- continue PT/OT and other supportive care  Hypertensive emergency/Malignant HTN -Due to financial constraints patient was not taking carvedilol and hydrochlorothiazide prior to admission -Continue Norvasc, Lopressor and hydralazine.   Acute hypoxemic respiratory failure secondary to CVA requiring mechanical ventilation (Resolved) Trach dependent -Downsized to #4 cuffless trach on 5/8 -Per trach team: Has  significant altered mentation and inability to manage oral secretions at present time is not a safe candidate for decannulation.  Discussed this with family as major obstacle in discharging to home.  This will also decrease options for SNF placement. -5/9: Reevaluated by trach team.  Again they reiterate patient is not a good candidate for decannulation "unless the family is prepared to transition to comfort should she deteriorate.".  PCCM will continue to follow weekly  Dysphagia -Continue tube feedings, Prosource liquid, free water -Follow CBGs and provide SSI      Other problems: Right upper extremity thrombophlebitis - [name] -> age indeterminate SVT of right cephalic vein  ESBL positive Enterobacter UTI -Has completed 5 days of meropenem -Contact isolation during hospitalization -continue Oxybutynin suspected bladder spasms  Bacterial Conjunctivitis /periorbital cellulitis -Resolved  Chronic pansinusitis/recurrent fevers 2/2 aspiration PNA;History of Serratia Pneumonia  Hospital Acquired Pneumonia:  -Completed several courses of antibiotic -Follow-up imaging including sinus CT consistent with chronic but stable and resolving sinusitis   Anemia -Stable  HLD -Continue Lipitor      Data Reviewed: Basic Metabolic Panel: No results for input(s): NA, K, CL, CO2, GLUCOSE,  BUN, CREATININE, CALCIUM, MG, PHOS in the last 168 hours. Liver Function Tests: No results for input(s): AST, ALT, ALKPHOS, BILITOT, PROT, ALBUMIN in the last 168 hours. CBC: No results for input(s): WBC, NEUTROABS, HGB, HCT, MCV, PLT in the last 168 hours.  CBG: Recent Labs  Lab 11/23/20 1319 11/23/20 1619 11/23/20 1938 11/23/20 2327 11/24/20 0511  GLUCAP 117* 104* 129* 150* 137*     Studies: No results found.  Scheduled Meds: . amLODipine  10 mg Per Tube Daily  . atorvastatin  40 mg Per Tube Daily  . baclofen  7.5 mg Per Tube 2 times per day  . chlorhexidine gluconate (MEDLINE KIT)  15 mL  Mouth Rinse BID  . Chlorhexidine Gluconate Cloth  6 each Topical Daily  . feeding supplement (PROSource TF)  45 mL Per Tube Daily  . free water  100 mL Per Tube Q4H  . hydrALAZINE  50 mg Per Tube Q8H  . insulin aspart  0-9 Units Subcutaneous Q4H  . mouth rinse  15 mL Mouth Rinse 5 X Daily  . metoprolol tartrate  100 mg Per Tube BID  . modafinil  200 mg Per Tube Daily  . oxybutynin  5 mg Per Tube BID  . pantoprazole sodium  40 mg Per Tube BID  . sennosides  5 mL Per Tube QHS   Continuous Infusions: . sodium chloride Stopped (10/26/20 1445)  . 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   Palliative care.    PCCM continues to follow.  No decannulation.   Procedures:  Tracheostomy  PEG tube  EEG  Echocardiogram  Antibiotics: Vancomycin 2/12-2/15 Maxipime 2/12-2/15 Ancef 2/19-2/27 Vancomycin 2/20 x 1 dose Maxipime 2/27- 3/5 Vancomycin 3/1-3/3 Ancef 3/6-3/14 Cipro 3/20 4-3/30 Unasyn 3/30-4/4 Rocephin 4/11-4/12 Meropenem 4/12-4/17   Time spent: 25 minutes    Erin Hearing ANP  [facility] 7 am - 330 pm/M-F for direct patient care and secure chat Please refer to [place] for contact info 89  Days

## 2020-11-25 LAB — GLUCOSE, CAPILLARY
Glucose-Capillary: 157 mg/dL — ABNORMAL HIGH (ref 70–99)
Glucose-Capillary: 112 mg/dL — ABNORMAL HIGH (ref 70–99)
Glucose-Capillary: 112 mg/dL — ABNORMAL HIGH (ref 70–99)
Glucose-Capillary: 149 mg/dL — ABNORMAL HIGH (ref 70–99)
Glucose-Capillary: 113 mg/dL — ABNORMAL HIGH (ref 70–99)

## 2020-11-25 NOTE — TOC Progression Note (Addendum)
Transition of Care [facility]) - Progression Note    Patient Details  Name: Charlene [name] MRN: [id number] Date of Birth: 19-Apr-1966  Transition of Care [facility]) CM/SW Contact  Janae Bridgeman, RN Phone Number: 11/25/2020, 7:46 AM  Clinical Narrative:    Case management spoke with Alison Murray, husband on the phone this morning after the husband left a voicemail on my phone yesterday evening.  [name], [name] verbalized that he and the family have declined placement for the patient in [facility] in [place], [place].  The husband states that the facility is too far away for family to be able to afford to visit the patient.  [name] expressed that if the nursing home were closer to [place], [place] that he would be in agreement to transfer the patient to a nursing home facility.  I explained to the patient's husband that [facility] is the only accepting facility and the only safe discharge plan, considering the husband and family expressed that they are unable to take care of the patient at home.  Junious Silk, NP was notified and [facility] leadership will be notified of the family's decision to refuse the bed offer to [facility] in [place], [place].  11/25/2020 0109 - Gretta Cool, MSW supervisor was notified that the patient's husband refused admission to [facility] in [place], [place].  Shon Baton, MSW asked that I call and report this to [facility] [address] of [place].  I called and filed a report with APS and gave the report to [place], MSW at APS.  11/25/2020  1153 - I called and spoke with Mariel Craft, CM with [facility] and the facility does not have any available trach beds at this time.  Hi[place], CM mentioned that [facility] in [place] may have availability to care for trach needs.  I called and left a message with Eda Keys, CM at the facility and I'm waiting for a return call and  follow up.  11/25/2020 1223 - I called and spoke with [facility] and the [facility] do not have beds at this time to care for the patient.  The [facility] in [place] will be taking trach patient's soon at some point but do not accept LOGs at this time.  The [facility] in [place] are able to take trach, have no beds at this time, but are unable to offer a bed at a later date until the patient's receives approval for her Medicaid.  The [facility] do not accept LOGs at this timer per Revonda Standard, CM at [facility].  Expected Discharge Plan: Skilled Nursing Facility Barriers to Discharge: Continued Medical Work up  Expected Discharge Plan and Services Expected Discharge Plan: Skilled Nursing Facility In-house Referral: Clinical Social Work Discharge Planning Services: CM Consult Post Acute Care Choice: Skilled Nursing Facility Living arrangements for the past 2 months: Single Family Home                                       Social Determinants of Health (SDOH) Interventions    Readmission Risk Interventions Readmission Risk Prevention Plan 09/29/2020  Transportation Screening Complete  PCP or Specialist Appt within 3-5 Days Complete  HRI or Home Care Consult Complete  Social Work Consult for Recovery Care Planning/Counseling Complete  Palliative Care Screening Complete  Medication Review Oceanographer) Complete  Some recent data might be hidden

## 2020-11-25 NOTE — Progress Notes (Signed)
CSW spoke with Charlene [name] of [facility] DSS regarding the patient's Medicaid application. Charlene [name] reports the application has been submitted to DDS for review. CSW advised Charlene [name] to speak directly with the patient's husband Charlene [name] for follow up if needed. Patient's pending Medicaid number is #[id number] M.  Edwin Dada, MSW, LCSW Transitions of Care  Clinical Social Worker II [phone]

## 2020-11-25 NOTE — Progress Notes (Addendum)
[facility] PROGRESS NOTE  DARLENY SEM ZYS:[id number] DOB: 27-Dec-1965 DOA: 08/26/2020 PCP: Patient, [name] (Inactive)    October 08, 2020     10/16/2020                        10/16/2020     10/18/2020                        10/20/2020    Status: Remains inpatient appropriate because:Altered mental status, Unsafe d/c plan, IV treatments appropriate due to intensity of illness or inability to take PO and Inpatient level of care appropriate due to severity of illness   Dispo: The patient is from: Home              Anticipated d/c is to: SNF trach capable-[place] but [place] available              Patient currently is medically stable to d/c.   Difficult to place patient Yes   Level of care: Progressive  Code Status: Full Family Communication: Checked information: [name] [phone]); [name] [phone]) is her POA and legal contact.  5/9 other at bedside.  Also case management updated [name] and was requesting answer regarding discharge disposition.  5/11 spoke with daughter since I had been informed after hours on 5/10 that she was at the bedside and wished to speak to a provider and specifically requested me.  When I called her back on 5/11 he stated she had not asked to speak to me.  Stated I was recontacting her since I knew she was responsible for following up on the [place] applications.  She states that she is no longer involved in this process and request that we contact patient's [name] in the future. DVT prophylaxis: Chronically bedbound.  5/6: We will discontinue prophylactic Lovenox   Vaccination status: Unvaccinated-have discussed both with patient's mother and with [name].  He wishes to speak to the rest of the family before making a decision but is leaning towards [name] vaccine.  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  Subsequently on 2/10, she developed obtundation and respiratory arrest leading to intubation.  She has remained persistently obtunded, now has a trach and a PEG.  Hospital course complicated by persistent fever, Serratia pneumonia and now pre-orbital cellulitis  As of 3/25 with adjustments in hypertonicity medications patient began to improve regarding hypertonicity.  Eventually started on provigil with improvements in alertness.  Evaluated by rehab physician who recommended increasing provigil dose further.  Subsequently patient has become more alert.  Most days she is able to track and with encouragement able to follow simple commands especially since partial obstruction glasses placed.  See progress note dictated 4/27 for expanded details.   2/09 Admit for [place] 2/10 EVD placed, later intubated due to obtundation and respiratory arrest 2/11 MRI Brain, hypertonic saline d/c'd, EVD not working.  Husband did not consent to PICC  2/13 EVD removed 2/17 [place] discussion with family, PMT 2/24 Bedside trach and PEG 3/1 Transferred to [facility]  Subjective: Did track some with right eye while I was talking with her but was unable to follow any simple commands.  No family at bedside  Objective: Vitals:   11/25/20 0300 11/25/20 0734  BP: 112/63 (P) 126/73  Pulse: 72 (P) 73  Resp: 16 (P) 15  Temp: 98.2 F (36.8 C) (P) 99 F (37.2 C)  SpO2: 99%     Intake/Output Summary (Last 24 hours) at 11/25/2020 0803 Last data filed at 11/25/2020 3810 Gross  per 24 hour  Intake 250 ml  Output --  Net 250 ml   Filed Weights   11/16/20 0500 11/22/20 0500 11/25/20 0346  Weight: 72.9 kg 76 kg 75.3 kg    Exam:  Constitutional: Awake, appears to be comfortable and in no acute distress Respiratory: Downsized to #4.0 cuffless trach, trach collar with FiO2 21% at 5L,O2 sats 97 to 99%.  Anterior lung sounds are clear.  Respiratory effort normal.  No tracheal secretions and patient managing oral secretions independently. Cardiovascular: Normal heart sounds, regular nontachycardic pulse, skin warm and dry, no peripheral edema Abdomen:  PEG tube tube feedings infusing-LBM 5/10, [place] soft and nontender on palpation.  Normoactive bowel sound Musculoskeletal: Bilateral atrophy of thigh and calf muscles Neurologic: CN 2-12 intact except notable with left ocular disconjugate gaze with medial right downward gaze.  Also today patient with mouth open possibly attempting to smile.  Able to now determine that she does have loss of left nasolabial fold and some subtle left lip droop.  Inconsistently tracks with right eye.  Psych: Awake with eyes open.  Nonverbal.   Assessment/Plan: Acute problems: Multiple acute strokes  Right Thalamus ICH with IVH  Obstructive Hydrocephalus/Acute on persistent metabolic Encephalopathy/brain injury related hypertonicity -Continue baclofen and Provigil -Continue scheduled Senokot -5/10 changed MiraLAX to scheduled daily -Continue PT/OT/SLP.  Will need to continue these therapies once discharged to SNF; SLP services will primarily be focused on Passy-Muir valve training -5/2, CIR MD reevaluated patient " Mrs. Plotz unfortunately has not made any meaningful functional gains over the last month. She remains most appropriate for SNF level care".   Neck Contracture/torticollis -- continue PT/OT and other supportive care  Hypertensive emergency/Malignant HTN -Due to financial constraints patient was not taking carvedilol and  hydrochlorothiazide prior to admission -Continue Norvasc, Lopressor and hydralazine.   Acute hypoxemic respiratory failure secondary to CVA requiring mechanical ventilation (Resolved) Trach dependent -Downsized to #4 cuffless trach on 5/8 -Per trach team: Has significant altered mentation and inability to manage oral secretions at present time is not a safe candidate for decannulation.  Discussed this with family as major obstacle in discharging to home.  This will also decrease options for SNF placement. -5/9: Reevaluated by trach team.  Again they reiterate patient is not a good candidate for decannulation "unless the family is prepared to transition to comfort should she deteriorate.".  PCCM will continue to follow weekly  Dysphagia -Continue tube feedings, Prosource liquid, free water -Follow CBGs and provide SSI      Other problems: Right upper extremity thrombophlebitis - [name] -> age indeterminate SVT of right cephalic vein  ESBL positive Enterobacter UTI -Has completed 5 days of meropenem -Contact isolation during hospitalization -continue Oxybutynin suspected bladder spasms  Bacterial Conjunctivitis /periorbital cellulitis -Resolved  Chronic pansinusitis/recurrent fevers 2/2 aspiration PNA;History of Serratia Pneumonia  Hospital Acquired Pneumonia:  -Completed  several courses of antibiotic -Follow-up imaging including sinus CT consistent with chronic but stable and resolving sinusitis   Anemia -Stable  HLD -Continue Lipitor      Data Reviewed: Basic Metabolic Panel: No results for input(s): NA, K, CL, CO2, GLUCOSE, BUN, CREATININE, CALCIUM, MG, PHOS in the last 168 hours. Liver Function Tests: No results for input(s): AST, ALT, ALKPHOS, BILITOT, PROT, ALBUMIN in the last 168 hours. CBC: No results for input(s): WBC, NEUTROABS, HGB, HCT, MCV, PLT in the last 168 hours.  CBG: Recent Labs  Lab 11/24/20 1627 11/24/20 2031 11/24/20 2354 11/25/20 0318  11/25/20 0733  GLUCAP 117* 122* 167* 157* 112*     Studies: No results found.  Scheduled Meds: . amLODipine  10 mg Per Tube Daily  . atorvastatin  40 mg Per Tube Daily  . baclofen  7.5 mg Per Tube 2 times per day  . chlorhexidine gluconate (MEDLINE KIT)  15 mL Mouth Rinse BID  . Chlorhexidine Gluconate Cloth  6 each Topical Daily  . feeding supplement (PROSource TF)  45 mL Per Tube Daily  . free water  100 mL Per Tube Q4H  . hydrALAZINE  50 mg Per Tube Q8H  . insulin aspart  0-9 Units Subcutaneous Q4H  . mouth rinse  15 mL Mouth Rinse 5 X Daily  . metoprolol tartrate  100 mg Per Tube BID  . modafinil  200 mg Per Tube Daily  . oxybutynin  5 mg Per Tube BID  . pantoprazole sodium  40 mg Per Tube BID  . polyethylene glycol  17 g Per Tube Daily  . sennosides  5 mL Per Tube QHS   Continuous Infusions: . sodium chloride Stopped (10/26/20 1445)  . feeding supplement (OSMOLITE 1.2 CAL) 1,000 mL (11/25/20 0618)    Principal Problem:   ICH (intracerebral hemorrhage) ([place]) Active Problems:   Intraparenchymal hemorrhage of brain (HCC)   Acute hypoxemic respiratory failure (HCC)   Fever   Endotracheal tube present   Status post tracheostomy ([place])   Palliative care by specialist   Muscle hypertonicity   Oropharyngeal dysphagia   Brain injury with loss of consciousness ([place])   Poor prognosis   Aspiration pneumonia of right lower lobe (HCC)   Gram-negative infection   Abnormal urinalysis   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   Acute cystitis without hematuria   Consultants:  Neurology   Neurosurgery.   Palliative care.    PCCM continues to follow.  No decannulation.   Procedures:  Tracheostomy  PEG tube  EEG  Echocardiogram  Antibiotics: Vancomycin 2/12-2/15 Maxipime 2/12-2/15 Ancef 2/19-2/27 Vancomycin 2/20 x 1 dose Maxipime 2/27- 3/5 Vancomycin 3/1-3/3 Ancef 3/6-3/14 Cipro 3/20  4-3/30 Unasyn 3/30-4/4 Rocephin 4/11-4/12 Meropenem 4/12-4/17   Time spent: 25 minutes    Erin Hearing ANP  [facility] 7 am - 330 pm/M-F for direct patient care and secure chat Please refer to [place] for contact info 90  Days

## 2020-11-25 NOTE — Plan of Care (Signed)
  Problem: Coping: Goal: Will identify appropriate support needs Outcome: Progressing   Problem: Self-Care: Goal: Ability to communicate needs accurately will improve Outcome: Progressing   Problem: Nutrition: Goal: Risk of aspiration will decrease Outcome: Progressing   Problem: Intracerebral Hemorrhage Tissue Perfusion: Goal: Complications of Intracerebral Hemorrhage will be minimized Outcome: Progressing   Problem: Clinical Measurements: Goal: Ability to maintain clinical measurements within normal limits will improve Outcome: Progressing Goal: Will remain free from infection Outcome: Progressing Goal: Diagnostic test results will improve Outcome: Progressing Goal: Respiratory complications will improve Outcome: Progressing Goal: Cardiovascular complication will be avoided Outcome: Progressing   Problem: Coping: Goal: Level of anxiety will decrease Outcome: Progressing   Problem: Elimination: Goal: Will not experience complications related to bowel motility Outcome: Progressing Goal: Will not experience complications related to urinary retention Outcome: Progressing   Problem: Pain Managment: Goal: General experience of comfort will improve Outcome: Progressing   Problem: Skin Integrity: Goal: Risk for impaired skin integrity will decrease Outcome: Progressing   Problem: Intracerebral Hemorrhage Tissue Perfusion: Goal: Complications of Intracerebral Hemorrhage will be minimized Outcome: Progressing   Problem: Ischemic Stroke/TIA Tissue Perfusion: Goal: Complications of ischemic stroke/TIA will be minimized Outcome: Progressing

## 2020-11-25 NOTE — Progress Notes (Signed)
Occupational Therapy Treatment Patient Details Name: Charlene [name] MRN: [id number] DOB: 1965-12-24 Today's Date: 11/25/2020    History of present illness Pt is 55 y/o female presents to [facility] on 08/26/20 with L sided weakness and headaches. 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 s/p Right frontal ventricular catheter placement on 2/10, stopped functioning on 2/11.  MRI 2/11 shows right greater than left mesial temporal lobes / parahippocampal cortex, bilateral basal ganglia, and possibly the upper pons that are concerning for acute infarcts, most likely secondary to mass effect from the hemorrhage. ETT 2/10-2/24.  Pt with trach and PEG on 09/10/20. Hospital course complicated by persistent fever, Serratia pneumonia and now pre-orbital cellulitis.   PMH includes anxiety, HTN.   OT comments  Pt lethargic this pm, frequently falling asleep despite stimulation.  She was unable to visually fixate or track this date and was not able to follow any commands.   PROM performed bil. UEs. Pt's spouse present. Goals updated.   Follow Up Recommendations  SNF;Supervision/Assistance - 24 hour    Equipment Recommendations  None recommended by OT    Recommendations for Other Services      Precautions / Restrictions Precautions Precautions: Fall Precaution Comments: peg and trach Required Braces or Orthoses: Other Brace Splint/Cast - Date Prophylactic Dressing Applied (if applicable): 11/25/20 Other Brace: RLE soft knee brace and LUE soft elbow brace (wearing schedule in RN orders); bil PRAFOs       Mobility Bed Mobility                    Transfers                      Balance                                           ADL either performed or assessed with clinical judgement   ADL                                                Vision   Additional Comments: Pt unable to visually fixate or track despite max cues   Perception     Praxis      Cognition Arousal/Alertness: Lethargic Behavior During Therapy: Flat affect Overall Cognitive Status: Impaired/Different from baseline                                 General Comments: Pt lethargic.  She was not able to visually fixate or track this date.  Did not attempt to follow any commands        Exercises General Exercises - Upper Extremity Shoulder Flexion: PROM;Left;10 reps;Supine;Right Elbow Flexion: PROM;Left;10 reps;Supine;Right Elbow Extension: PROM;Left;10 reps;Supine;Right Wrist Flexion: Right;Left;PROM;10 reps;Supine Wrist Extension: PROM;Right;Left;10 reps;Supine Digit Composite Flexion: PROM;Right;Left;10 reps;Supine Composite Extension: PROM;Right;Left;10 reps;Supine   Shoulder Instructions       General Comments      Pertinent Vitals/ Pain       Pain Assessment: Faces Faces Pain Scale: Hurts little more Pain Location: R knee with extension Pain Descriptors / Indicators:  Spasm Pain Intervention(s): Monitored during session;Repositioned;Limited activity within patient's tolerance  Home Living                                          Prior Functioning/Environment              Frequency  Min 2X/week        Progress Toward Goals  OT Goals(current goals can now be found in the care plan section)  Progress towards OT goals: Not progressing toward goals - comment (lethargy this date)  Acute Rehab OT Goals Patient Stated Goal: Pt unable to state OT Goal Formulation: With family Time For Goal Achievement: 12/09/20 Potential to Achieve Goals: Fair ADL Goals Additional ADL Goal #2: Patient will consistently track with R eye 50% of range (to midline), fixating on given object for 5 seconds in 3/6 trials. Additional ADL Goal #3: Patient will demonstrate ability to give  thumbs up with R hand with 50% accuracy given increased time (2-3 minutes).  Plan Discharge plan remains appropriate;Frequency remains appropriate    Co-evaluation                 AM-PAC OT "6 Clicks" Daily Activity     Outcome Measure   Help from another person eating meals?: Total Help from another person taking care of personal grooming?: Total Help from another person toileting, which includes using toliet, bedpan, or urinal?: Total Help from another person bathing (including washing, rinsing, drying)?: Total Help from another person to put on and taking off regular upper body clothing?: Total Help from another person to put on and taking off regular lower body clothing?: Total 6 Click Score: 6    End of Session Equipment Utilized During Treatment: Oxygen  OT Visit Diagnosis: Muscle weakness (generalized) (M62.81);Apraxia (R48.2);Cognitive communication deficit (R41.841);Hemiplegia and hemiparesis Symptoms and signs involving cognitive functions: Other Nontraumatic ICH Hemiplegia - Right/Left: Right Hemiplegia - caused by: Other Nontraumatic intracranial hemorrhage   Activity Tolerance Patient limited by lethargy   Patient Left in bed;with call bell/phone within reach;with family/visitor present   Nurse Communication Mobility status        Time: 6294-7654 OT Time Calculation (min): 22 min  Charges: OT General Charges $OT Visit: 1 Visit OT Treatments $Neuromuscular Re-education: 8-22 mins  Eber Jones OTR/L Acute Rehabilitation Services Pager [phone] Office [phone]    Jeani Hawking M 11/25/2020, 5:01 PM

## 2020-11-26 LAB — BASIC METABOLIC PANEL
Anion gap: 9 (ref 5–15)
BUN: 14 mg/dL (ref 6–20)
CO2: 30 mmol/L (ref 22–32)
Calcium: 9.4 mg/dL (ref 8.9–10.3)
Chloride: 100 mmol/L (ref 98–111)
Creatinine, Ser: 0.51 mg/dL (ref 0.44–1.00)
GFR, Estimated: >60 mL/min (ref >60)
Glucose, Bld: 134 mg/dL — ABNORMAL HIGH (ref 70–99)
Potassium: 3.7 mmol/L (ref 3.5–5.1)
Sodium: 139 mmol/L (ref 135–145)

## 2020-11-26 LAB — CBC
HCT: 34.1 % — ABNORMAL LOW (ref 36.0–46.0)
Hemoglobin: 10.8 g/dL — ABNORMAL LOW (ref 12.0–15.0)
MCH: 25.4 pg — ABNORMAL LOW (ref 26.0–34.0)
MCHC: 31.7 g/dL (ref 30.0–36.0)
MCV: 80.2 fL (ref 80.0–100.0)
Platelets: 337 10*3/uL (ref 150–400)
RBC: 4.25 MIL/uL (ref 3.87–5.11)
RDW: 15.3 % (ref 11.5–15.5)
WBC: 5.8 10*3/uL (ref 4.0–10.5)
nRBC: 0 % (ref 0.0–0.2)

## 2020-11-26 LAB — GLUCOSE, CAPILLARY
Glucose-Capillary: 107 mg/dL — ABNORMAL HIGH (ref 70–99)
Glucose-Capillary: 113 mg/dL — ABNORMAL HIGH (ref 70–99)
Glucose-Capillary: 118 mg/dL — ABNORMAL HIGH (ref 70–99)
Glucose-Capillary: 118 mg/dL — ABNORMAL HIGH (ref 70–99)
Glucose-Capillary: 133 mg/dL — ABNORMAL HIGH (ref 70–99)
Glucose-Capillary: 134 mg/dL — ABNORMAL HIGH (ref 70–99)

## 2020-11-26 MED ORDER — TIZANIDINE HCL 4 MG PO TABS
2.0000 mg | ORAL_TABLET | Freq: Every day | ORAL | Status: DC
  Filled 2020-11-26: qty 1

## 2020-11-26 MED ORDER — TIZANIDINE HCL 4 MG PO TABS
2.0000 mg | ORAL_TABLET | Freq: Every day | ORAL | Status: DC
  Administered 2020-11-26 – 2020-12-16 (×21): 2 mg
  Filled 2020-11-26 (×21): qty 1

## 2020-11-26 NOTE — Progress Notes (Signed)
Physical Therapy Treatment Patient Details Name: Charlene [name] MRN: [id number] DOB: 1966-06-16 Today's Date: 11/26/2020    History of Present Illness Pt is 55 y/o female presents to [facility] on 08/26/20 with L sided weakness and headaches. 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 s/p Right frontal ventricular catheter placement on 2/10, stopped functioning on 2/11.  MRI 2/11 shows right greater than left mesial temporal lobes / parahippocampal cortex, bilateral basal ganglia, and possibly the upper pons that are concerning for acute infarcts, most likely secondary to mass effect from the hemorrhage. ETT 2/10-2/24.  Pt with trach and PEG on 09/10/20. Hospital course complicated by persistent fever, Serratia pneumonia and now pre-orbital cellulitis.   PMH includes anxiety, HTN.    PT Comments    Mother present throughout session and engaged with therapists. Focus of session was EOB activity with cervical and trunk stretching, lateral leans onto bilateral elbows, and tracking activity. Pt began following commands towards the end of the session after sitting up EOB with therapist ~20 minutes. Pt tracked a picture consistently and did pass midline to the L x3. Pt began to turn head to the L but no other attempts to voluntarily turn head were noted. Will continue to follow and progress as able per POC.    Follow Up Recommendations  SNF     Equipment Recommendations  Hospital bed;Other (comment);Wheelchair cushion (measurements PT);Wheelchair (measurements PT)    Recommendations for Other Services Rehab consult     Precautions / Restrictions Precautions Precautions: Fall Precaution Comments: peg and trach Other Brace: RLE soft knee brace and LUE soft elbow brace (wearing schedule in RN orders); bil PRAFOs Restrictions Weight Bearing Restrictions: No     Mobility  Bed Mobility Overal bed mobility: Needs Assistance Bed Mobility: Rolling Rolling: Total assist Sidelying to sit: Total assist;+2 for physical assistance   Sit to supine: +2 for physical assistance;Total assist   General bed mobility comments: Two person total assist to roll bil for fixing of pads, two person total assist to come up to sitting EOB and return to supine support at trunk and legs.    Transfers                 General transfer comment: Did not assess this session.  Ambulation/Gait             General Gait Details: non ambulatory   Stairs             Wheelchair Mobility    Modified Rankin (Stroke Patients Only) Modified Rankin (Stroke Patients Only) Pre-Morbid Rankin Score: No symptoms Modified Rankin: Severe disability     Balance Overall balance assessment: Needs assistance Sitting-balance support: Feet supported;Bilateral upper extremity supported Sitting balance-Leahy Scale: Poor   Postural control: Right lateral lean     Standing balance comment: unable                            Cognition Arousal/Alertness: Lethargic Behavior During Therapy: Flat affect Overall Cognitive Status: Impaired/Different from baseline Area of Impairment: Orientation;Attention;Memory;Following commands;Safety/judgement;Awareness;Problem solving                 Orientation Level: Place;Time;Situation Current Attention Level: Focused Memory: Decreased short-term memory Following Commands: Follows one step commands inconsistently;Follows one step commands with increased time Safety/Judgement: Decreased awareness of safety;Decreased awareness of deficits Awareness: Intellectual  Problem Solving: Slow processing;Decreased initiation;Difficulty sequencing;Requires verbal cues;Requires tactile cues General Comments: Pt began following commands towards the end of the session after sitting up EOB with therapist ~20 minutes. Pt  tracked a picture consistently and did pass midline to the L x3. Pt began to turn head to the L but no other attempts to voluntarily turn head were noted.      Exercises General Exercises - Upper Extremity Shoulder Flexion: PROM;10 reps;Right;Left Shoulder ABduction: PROM;10 reps;Right;Left Elbow Flexion: PROM;10 reps;Right;Left Elbow Extension: PROM;10 reps;Right;Left Other Exercises Other Exercises: Stretch into knee extension RLE after soft brace taken off. Other Exercises: Sitting EOB, x10 passive shoulder flexion with manual facilitation of upward rotation of scapula bilaterally. Other Exercises: Sitting EOB, facilitated trunk rotation R and L with 20-30 second h old x2 each direction Other Exercises: Keeping head turned  L; gentle R rotation stretch x 2 for 30 sec. Cervical extension stretch to neutral throughout session. Other Exercises: Resting onto elbow x2 to R and x2 to L with facilitation at the shoulder and opposite hip for return to upright.    General Comments General comments (skin integrity, edema, etc.): VSS on RA with PMV donned.  Only vocalizations were some grunting to pain.      Pertinent Vitals/Pain Pain Assessment: Faces Faces Pain Scale: Hurts a little bit Pain Location: R knee with extension, facial grimacing with gross movement at times. Pain Descriptors / Indicators: Grimacing Pain Intervention(s): Limited activity within patient's tolerance;Monitored during session;Repositioned    Home Living                      Prior Function            PT Goals (current goals can now be found in the care plan section) Acute Rehab PT Goals Patient Stated Goal: Pt unable to state PT Goal Formulation: Patient unable to participate in goal setting Time For Goal Achievement: 11/30/20 Potential to Achieve Goals: Poor Progress towards PT goals: Progressing toward goals    Frequency    Min 2X/week      PT Plan Current plan remains appropriate     Co-evaluation              AM-PAC PT "6 Clicks" Mobility   Outcome Measure  Help needed turning from your back to your side while in a flat bed without using bedrails?: Total Help needed moving from lying on your back to sitting on the side of a flat bed without using bedrails?: Total Help needed moving to and from a bed to a chair (including a wheelchair)?: Total Help needed standing up from a chair using your arms (e.g., wheelchair or bedside chair)?: Total Help needed to walk in hospital room?: Total Help needed climbing 3-5 steps with a railing? : Total 6 Click Score: 6    End of Session Equipment Utilized During Treatment: Gait belt Activity Tolerance: Patient tolerated treatment well Patient left: in bed;with call bell/phone within reach;with family/visitor present Nurse Communication: Mobility status;Need for lift equipment PT Visit Diagnosis: Hemiplegia and hemiparesis;Other symptoms and signs involving the nervous system (R29.898);Other abnormalities of gait and mobility (R26.89) Hemiplegia - Right/Left: Right Hemiplegia - dominant/non-dominant: Dominant Hemiplegia - caused by: Cerebral infarction;Nontraumatic intracerebral hemorrhage;Other Nontraumatic intracranial hemorrhage     Time: 1206-1251 PT Time Calculation (min) (ACUTE ONLY): 45 min  Charges:  $Therapeutic Activity: 8-22 mins $Neuromuscular Re-education: 23-37 mins  Conni Slipper, PT, DPT Acute Rehabilitation Services Pager: [phone] Office: [phone]    Marylynn Pearson 11/26/2020, 2:37 PM

## 2020-11-26 NOTE — Plan of Care (Signed)
  Problem: Self-Care: Goal: Ability to communicate needs accurately will improve Outcome: Progressing   Problem: Nutrition: Goal: Risk of aspiration will decrease Outcome: Progressing   Problem: Intracerebral Hemorrhage Tissue Perfusion: Goal: Complications of Intracerebral Hemorrhage will be minimized Outcome: Progressing   Problem: Clinical Measurements: Goal: Ability to maintain clinical measurements within normal limits will improve Outcome: Progressing Goal: Will remain free from infection Outcome: Progressing Goal: Diagnostic test results will improve Outcome: Progressing Goal: Respiratory complications will improve Outcome: Progressing Goal: Cardiovascular complication will be avoided Outcome: Progressing   Problem: Coping: Goal: Level of anxiety will decrease Outcome: Progressing   Problem: Elimination: Goal: Will not experience complications related to bowel motility Outcome: Progressing Goal: Will not experience complications related to urinary retention Outcome: Progressing   Problem: Pain Managment: Goal: General experience of comfort will improve Outcome: Progressing   Problem: Intracerebral Hemorrhage Tissue Perfusion: Goal: Complications of Intracerebral Hemorrhage will be minimized Outcome: Progressing   Problem: Ischemic Stroke/TIA Tissue Perfusion: Goal: Complications of ischemic stroke/TIA will be minimized Outcome: Progressing

## 2020-11-26 NOTE — TOC Progression Note (Signed)
Transition of Care [facility]) - Progression Note    Patient Details  Name: Charlene [name] MRN: [id number] Date of Birth: [birth year]  Transition of Care [facility] CM/SW Contact  Janae Bridgeman, RN Phone Number: 11/26/2020, 8:38 AM  Clinical Narrative:    Case management left a voice message with APS this morning to follow up on APS report filed yesterday regarding patient's family's decision to decline admission to [facility] [place] SNF facility.   CM and MSW will continue to follow in transitions of care needs.   Expected Discharge Plan: Skilled Nursing Facility Barriers to Discharge: Continued Medical Work up  Expected Discharge Plan and Services Expected Discharge Plan: Skilled Nursing Facility In-house Referral: Clinical Social Work Discharge Planning Services: CM Consult Post Acute Care Choice: Skilled Nursing Facility Living arrangements for the past 2 months: Single Family Home                                       Social Determinants of Health (SDOH) Interventions    Readmission Risk Interventions Readmission Risk Prevention Plan 09/29/2020  Transportation Screening Complete  PCP or Specialist Appt within 3-5 Days Complete  HRI or Home Care Consult Complete  Social Work Consult for Recovery Care Planning/Counseling Complete  Palliative Care Screening Complete  Medication Review Ocean[facility]) Complete  Some recent data might be hidden

## 2020-11-26 NOTE — TOC Progression Note (Signed)
Transition of Care [facility]) - Progression Note    Patient Details  Name: Charlene [name] MRN: [id number] Date of Birth: Apr 04, 1966  Transition of Care [facility]) CM/SW Contact  Janae Bridgeman, RN Phone Number: 11/26/2020, 9:34 AM  Clinical Narrative:    Case management received notification from [place], with APS [facility] services that the patient's care was screened out and deferred back to the hospital to pursue guardianship of the patient.  Macario Golds, MSW supervisor and Grandville Silos, MSW supervisor were notified at this point.  Case management called and spoke with Alison Murray, husband and he was informed that the hospital would pursue guardianship of the patient in order to provide safe plan for discharge.  Alison Murray, husband verbalized that he and the other family members were unable to care for the patient at home and decline transfer of the patient to [facility] SNF in [place], [place].  The husband, Alison Murray, states that he understands that the hospital seeking guardianship means that the hospital will be over-riding his decision as a legal next of kin.  CM and MSW will continue to follow the patient for [facility] placement and guardianship of the patient.  Expected Discharge Plan: Skilled Nursing Facility Barriers to Discharge: Continued Medical Work up  Expected Discharge Plan and Services Expected Discharge Plan: Skilled Nursing Facility In-house Referral: Clinical Social Work Discharge Planning Services: CM Consult Post Acute Care Choice: Skilled Nursing Facility Living arrangements for the past 2 months: Single Family Home                                       Social Determinants of Health (SDOH) Interventions    Readmission Risk Interventions Readmission Risk Prevention Plan 09/29/2020  Transportation Screening Complete  PCP or Specialist Appt within 3-5 Days Complete  HRI or Home Care Consult Complete  Social Work Consult for Recovery Care  Planning/Counseling Complete  Palliative Care Screening Complete  Medication Review Oceanographer) Complete  Some recent data might be hidden

## 2020-11-26 NOTE — Progress Notes (Addendum)
[facility] PROGRESS NOTE  GITA DILGER PQZ:[id number] DOB: 11/22/1965 DOA: 08/26/2020 PCP: Patient, [name] (Inactive)    October 08, 2020     10/16/2020                        10/16/2020     10/18/2020                        10/20/2020    Status: Remains inpatient appropriate because:Altered mental status, Unsafe d/c plan, IV treatments appropriate due to intensity of illness or inability to take PO and Inpatient level of care appropriate due to severity of illness   Dispo: The patient is from: Home              Anticipated d/c is to: SNF trach capable-[place]              Patient currently is medically stable to d/c.   Difficult to place patient Yes   Level of care: Progressive  Code Status: Full Family Communication: Checked information: [name] [phone]); [name] [phone]) is her POA and legal contact.  5/9 other at bedside.  Also case management updated [name] and was requesting answer regarding discharge disposition.  5/11 spoke with daughter since I had been informed after hours on 5/10 that she was at the bedside and wished to speak to a provider and specifically requested me.  When I called her back on 5/11 he stated she had not asked to speak to me.  Stated I was recontacting her since I knew she was responsible for following up on the [place] applications.  She states that she is no longer involved in this process and request that we contact patient's [name] in the future. DVT prophylaxis: Chronically bedbound.  5/6: We will discontinue prophylactic Lovenox   Vaccination status: Unvaccinated-have discussed both with patient's mother and with [name].  He wishes to speak to the rest of the family before making a decision but is leaning towards [name] vaccine.  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  Subsequently on 2/10, she developed obtundation and respiratory arrest leading to intubation.  She has remained persistently obtunded, now has a trach and a PEG.  Hospital course complicated by persistent fever, Serratia pneumonia and now pre-orbital cellulitis  As of 3/25 with adjustments in hypertonicity medications patient began to improve regarding hypertonicity.  Eventually started on provigil with improvements in alertness.  Evaluated by rehab physician who recommended increasing provigil dose further.  Subsequently patient has become more alert.  Most days she is able to track and with encouragement able to follow simple commands especially since partial obstruction glasses placed.  See progress note dictated 4/27 for expanded details.  Unfortunately despite very aggressive treatment regimen patient has not had any significant improvement in her neurological status.  Although she is awake she is nonverbal  and unable to communicate her wants or needs.  Because of this it is clear she does not have the capacity to make her own decisions.  It is unclear if she will ever have capacity to make decisions given the severity of her brain injury from multiple strokes   2/09 Admit for ICH 2/10 EVD placed, later intubated due to obtundation and respiratory arrest 2/11 MRI Brain, hypertonic saline d/c'd, EVD not working.  Husband did not consent to PICC  2/13 EVD removed 2/17 [place] discussion with family, PMT 2/24 Bedside trach and PEG 3/1  Transferred to [facility]  Subjective: Awake.  Not following commands, remains nonverbal.  Not tracking with right eye today.  Objective: Vitals:   11/26/20 0708 11/26/20 0752  BP:  125/78  Pulse:  78  Resp: 14 16  Temp:  98.3 F (36.8 C)  SpO2:  100%    Intake/Output Summary (Last 24 hours) at 11/26/2020 0840 Last data filed at 11/25/2020 1500 Gross per 24 hour  Intake 2475 ml  Output --  Net 2475 ml   Filed Weights   11/16/20 0500 11/22/20 0500 11/25/20 0346  Weight: 72.9 kg 76 kg 75.3 kg    Exam:  Constitutional: Eyes open, does not appear to be in any acute distress.  With mother performing PROM of lower extremities she does grimace and occasionally grunt Respiratory: Downsized to #4.0 cuffless trach, trach collar with FiO2 21% at 5L,O2 sats 97 to 99%.  Anterior lung sounds are clear to auscultation.  Able to manage oral secretions.  No tracheal secretions. Cardiovascular: Normal heart sounds, no peripheral edema, regular pulse without tachycardia. Abdomen:  PEG tube tube feedings infusing-LBM 5/12,  Musculoskeletal: Bilateral atrophy of thigh and calf muscles Neurologic: CN 2-12 intact except notable with left ocular disconjugate gaze with medial right downward gaze.  Also today patient with mouth open possibly attempting to smile.   Psych: Open.  Nonverbal.   Assessment/Plan: Acute problems: Multiple acute strokes  Right Thalamus ICH with IVH  Obstructive Hydrocephalus/Acute on persistent metabolic Encephalopathy/brain injury related hypertonicity -Continue baclofen and Provigil; 5/12 given some persistent muscle spasticity I will begin tizanidine at bedtime and if tolerates without excessive sleepiness can consider adding at least 1 daytime dose. -Continue scheduled Senokot and MiraLAX -Continue PT/OT/SLP.  Will need to continue these therapies once discharged to SNF; SLP services will primarily be focused on Passy-Muir valve training -5/2, CIR MD reevaluated patient "  Mrs. Lengacher unfortunately has not made any meaningful functional gains over the last month. She remains most appropriate for SNF level care".   Neck Contracture/torticollis -- continue PT/OT and other supportive care  Hypertensive emergency/Malignant HTN -Due to financial constraints patient was not taking carvedilol and hydrochlorothiazide prior to admission -Continue Norvasc, Lopressor and hydralazine.   Acute hypoxemic respiratory failure secondary to CVA requiring mechanical ventilation (Resolved) Trach dependent -Downsized to #4 cuffless trach on 5/8 -Per trach team: Has significant altered mentation and inability to manage oral secretions at present time is not a safe candidate for decannulation.  Discussed this with family as major obstacle in discharging to home.  This will also decrease options for SNF placement. -5/9: Reevaluated by trach team.  Again they reiterate patient is not a good candidate for decannulation "unless the family is prepared to transition to comfort should she deteriorate.".  PCCM will continue to follow weekly  Dysphagia -Continue tube feedings, Prosource liquid, free water -Follow CBGs and provide SSI      Other problems: Right upper extremity  thrombophlebitis - [name] -> age indeterminate SVT of right cephalic vein  ESBL positive Enterobacter UTI -Has completed 5 days of meropenem -Contact isolation during hospitalization -continue Oxybutynin suspected bladder spasms  Bacterial Conjunctivitis /periorbital cellulitis -Resolved  Chronic pansinusitis/recurrent fevers 2/2 aspiration PNA;History of Serratia Pneumonia  Hospital Acquired Pneumonia:  -Completed several courses of antibiotic -Follow-up imaging including sinus CT consistent with chronic but stable and resolving sinusitis   Anemia -Stable  HLD -Continue Lipitor      Data Reviewed: Basic Metabolic Panel: No results for input(s): NA, K, CL, CO2, GLUCOSE, BUN, CREATININE, CALCIUM,  MG, PHOS in the last 168 hours. Liver Function Tests: No results for input(s): AST, ALT, ALKPHOS, BILITOT, PROT, ALBUMIN in the last 168 hours. CBC: No results for input(s): WBC, NEUTROABS, HGB, HCT, MCV, PLT in the last 168 hours.  CBG: Recent Labs  Lab 11/25/20 1520 11/25/20 2046 11/26/20 0023 11/26/20 0349 11/26/20 0749  GLUCAP 149* 113* 133* 113* 118*     Studies: No results found.  Scheduled Meds: . amLODipine  10 mg Per Tube Daily  . atorvastatin  40 mg Per Tube Daily  . baclofen  7.5 mg Per Tube 2 times per day  . chlorhexidine gluconate (MEDLINE KIT)  15 mL Mouth Rinse BID  . Chlorhexidine Gluconate Cloth  6 each Topical Daily  . feeding supplement (PROSource TF)  45 mL Per Tube Daily  . free water  100 mL Per Tube Q4H  . hydrALAZINE  50 mg Per Tube Q8H  . insulin aspart  0-9 Units Subcutaneous Q4H  . mouth rinse  15 mL Mouth Rinse 5 X Daily  . metoprolol tartrate  100 mg Per Tube BID  . modafinil  200 mg Per Tube Daily  . oxybutynin  5 mg Per Tube BID  . pantoprazole sodium  40 mg Per Tube BID  . polyethylene glycol  17 g Per Tube Daily  . sennosides  5 mL Per Tube QHS   Continuous Infusions: . sodium chloride Stopped (10/26/20 1445)  . 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    Palliative care.    PCCM continues to follow.  No decannulation.   Procedures:  Tracheostomy  PEG tube  EEG  Echocardiogram  Antibiotics: Vancomycin 2/12-2/15 Maxipime 2/12-2/15 Ancef 2/19-2/27 Vancomycin 2/20 x 1 dose Maxipime 2/27- 3/5 Vancomycin 3/1-3/3 Ancef 3/6-3/14 Cipro 3/20 4-3/30 Unasyn 3/30-4/4 Rocephin 4/11-4/12 Meropenem 4/12-4/17   Time spent: 25 minutes    Erin Hearing ANP  [facility] 7 am - 330 pm/M-F for direct patient care and secure chat Please refer to Amion for contact info 91  Days  Patient having diarrhea. 2-3 episodes yesterday, 1 early this AM and 1 noted on the bed pad when I examined her. WBC ordered and are normal. DC Senna.   Debbe Odea, MD

## 2020-11-27 LAB — GLUCOSE, CAPILLARY
Glucose-Capillary: 118 mg/dL — ABNORMAL HIGH (ref 70–99)
Glucose-Capillary: 119 mg/dL — ABNORMAL HIGH (ref 70–99)
Glucose-Capillary: 123 mg/dL — ABNORMAL HIGH (ref 70–99)
Glucose-Capillary: 129 mg/dL — ABNORMAL HIGH (ref 70–99)
Glucose-Capillary: 135 mg/dL — ABNORMAL HIGH (ref 70–99)
Glucose-Capillary: 144 mg/dL — ABNORMAL HIGH (ref 70–99)
Glucose-Capillary: 159 mg/dL — ABNORMAL HIGH (ref 70–99)

## 2020-11-27 NOTE — Progress Notes (Signed)
  Speech Language Pathology Treatment: Dysphagia;Cognitive-Linquistic;Passy Muir Speaking valve  Patient Details Name: Charlene [name] MRN: [id number] DOB: 27-Dec-1965 Today's Date: 11/27/2020 Time: 6789-3810 SLP Time Calculation (min) (ACUTE ONLY): 21 min  Assessment / Plan / Recommendation Clinical Impression  Goals during session were use of upper airway, initiation of vocalization/oral-movements, communication and oropharyngeal function with po trials. She sat edge of bed with OT posterior and ST anterior providing interactive opportunities during functional activities. Daughter "K" present and "first time seeing her in a long time." Educated and answered appropriate questions re: previous and present function. While she has made improvements from admission, progress in Speech therapy from ability to participate in evaluations (4/26), has been slow with limited improvements in attention, initiation, comprehension and expression.  Maximum visual/tactile/verbal cueing provided during activities during feeding, visual tracking of objects, to prompt gestural/verbal response and interaction with daughter. Pt allowed hand over hand guiding and independently accepted pudding spoonful with minimal right arm activation reaching for spoon. Tactile assist with head gestures to yes/no questions (very stiff). Initiated command to raise thumb and moved thumb in downward direction. Moaned in pain when laid back in bed.  Pudding transit to posterior oral cavity delayed 2 minutes needing dry spoon to re-initiate lingual movement due to poor attention. Suspected lingual pumping visualized bobbing of larynx with complete swallows triggered 3-4 times. Oral pooling and labial loss mixed with saliva. ST will continue with swallow facilitation and prognosis is good however likely will be extended.    HPI HPI: Pt is 55 y/o female presents to [facility] on 2/9 with L sided weakness and headaches. PMH includes anxiety, HTN. 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 s/p Right frontal ventricular catheter placement on 2/10, stopped functioning on 2/11.  MRI 2/11 shows right greater than left mesial temporal lobes / parahippocampal cortex, bilateral basal ganglia, and possibly the upper pons that are concerning for acute infarcts, most likely secondary to mass effect from the hemorrhage. ETT 2/10; trach/PEG 09/10/20.  Trach changed to #6 cuffless 3/11. Hospital course complicated by persistent fever,  Serratia pneumonia and pre-orbital cellulitis.      SLP Plan  Continue with current plan of care       Recommendations  Diet recommendations: NPO Medication Administration: Via alternative means      Patient may use Passy-Muir Speech Valve: Intermittently with supervision;Caregiver trained to provide supervision;During all therapies with supervision PMSV Supervision: Full         Oral Care Recommendations: Oral care QID Follow up Recommendations: Skilled Nursing facility SLP Visit Diagnosis: Aphonia (R49.1);Cognitive communication deficit (R41.841);Dysphagia, oropharyngeal phase (R13.12) Plan: Continue with current plan of care                       Royce Macadamia 11/27/2020, 11:27 AM  Breck Coons Lonell Face.Ed Nurse, children's [phone] Office [phone]

## 2020-11-27 NOTE — Progress Notes (Signed)
Occupational Therapy Treatment Patient Details Name: Charlene [name] MRN: [id number] DOB: 07-Sep-1965 Today's Date: 11/27/2020    History of present illness Pt is 55 y/o female presents to [facility] on 08/26/20 with L sided weakness and headaches. 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 s/p Right frontal ventricular catheter placement on 2/10, stopped functioning on 2/11.  MRI 2/11 shows right greater than left mesial temporal lobes / parahippocampal cortex, bilateral basal ganglia, and possibly the upper pons that are concerning for acute infarcts, most likely secondary to mass effect from the hemorrhage. ETT 2/10-2/24.  Pt with trach and PEG on 09/10/20. Hospital course complicated by persistent fever, Serratia pneumonia and now pre-orbital cellulitis.   PMH includes anxiety, HTN.   OT comments  Patient seen for OT/SLP session.  Requires total assist to transition to EOB, sitting EOB maintain sitting balance statically with at best min assist (R lateral lean) when attempting to decreased assist to min guard.  Feeding trials with facilitating R UE into flexion and total assist to bring to mouth, but pt opening mouth during task.  Appears to attempt reaching to spoon with RUE, turns head towards R to locate daughter.  Will continue to follow.    Follow Up Recommendations  SNF;Supervision/Assistance - 24 hour    Equipment Recommendations  None recommended by OT    Recommendations for Other Services      Precautions / Restrictions Precautions Precautions: Fall Precaution Comments: peg and trach Required Braces or Orthoses: Other Brace Other Brace: RLE soft knee brace and LUE soft elbow brace (wearing schedule in RN orders); bil PRAFOs Restrictions Weight Bearing Restrictions: No       Mobility Bed Mobility Overal bed mobility: Needs Assistance Bed  Mobility: Rolling;Sidelying to Sit;Sit to Sidelying Rolling: Total assist Sidelying to sit: Total assist;+2 for physical assistance     Sit to sidelying: Total assist;+2 for physical assistance General bed mobility comments: total assist to roll and transition to EOB with trunk and LB support    Transfers                      Balance Overall balance assessment: Needs assistance Sitting-balance support: Single extremity supported;Feet supported Sitting balance-Leahy Scale: Poor Sitting balance - Comments: as good as min assist at EOB, placing R UE as support initally and removing when attempting swallowing trials Postural control: Right lateral lean                                 ADL either performed or assessed with clinical judgement   ADL     Eating/Feeding Details (indicate cue type and reason): SLP present.  Placed spoon in R hand and moved spoon with pudding towards mouth with max facilitation to flex elbow and supinate hand.  During 2nd attempt pt appears to be reaching for spoon with RUE but difficult to determine if truely volitional.                                         Vision   Additional Comments: visually fixating and tracking spoon with pudding and daughter Joyce Gross) in room, towards midline but not past   Perception     Praxis  Cognition Arousal/Alertness: Awake/alert Behavior During Therapy: Flat affect Overall Cognitive Status: Impaired/Different from baseline Area of Impairment: Following commands;Awareness;Attention                   Current Attention Level: Focused   Following Commands: Follows one step commands inconsistently;Follows one step commands with increased time   Awareness: Intellectual   General Comments: following simple commands with increased time, but very inconsistent (open mouth, move thumb) given mulitmodal cueing.  Tracks spoon and daugther towards L side, turns head to R side         Exercises     Shoulder Instructions       General Comments VSS on RA with PMV donned.  Only vocalizations with grunting.    Pertinent Vitals/ Pain       Pain Assessment: Faces Faces Pain Scale: Hurts little more Pain Location: general and right knee Pain Descriptors / Indicators: Grimacing;Discomfort Pain Intervention(s): Monitored during session;Repositioned  Home Living                                          Prior Functioning/Environment              Frequency  Min 2X/week        Progress Toward Goals  OT Goals(current goals can now be found in the care plan section)  Progress towards OT goals: Progressing toward goals (slowly)  Acute Rehab OT Goals Patient Stated Goal: Pt unable to state  Plan Discharge plan remains appropriate;Frequency remains appropriate    Co-evaluation    PT/OT/SLP Co-Evaluation/Treatment: Yes Reason for Co-Treatment: To address functional/ADL transfers   OT goals addressed during session: Strengthening/ROM;ADL's and self-care      AM-PAC OT "6 Clicks" Daily Activity     Outcome Measure   Help from another person eating meals?: Total Help from another person taking care of personal grooming?: Total Help from another person toileting, which includes using toliet, bedpan, or urinal?: Total Help from another person bathing (including washing, rinsing, drying)?: Total Help from another person to put on and taking off regular upper body clothing?: Total Help from another person to put on and taking off regular lower body clothing?: Total 6 Click Score: 6    End of Session Equipment Utilized During Treatment: Oxygen  OT Visit Diagnosis: Muscle weakness (generalized) (M62.81);Apraxia (R48.2);Cognitive communication deficit (R41.841);Hemiplegia and hemiparesis Symptoms and signs involving cognitive functions: Other Nontraumatic ICH Hemiplegia - Right/Left: Right Hemiplegia - caused by: Other Nontraumatic  intracranial hemorrhage   Activity Tolerance Patient tolerated treatment well   Patient Left in bed;with call bell/phone within reach;with family/visitor present   Nurse Communication Mobility status        Time: 0272-5366 OT Time Calculation (min): 48 min  Charges: OT General Charges $OT Visit: 1 Visit OT Treatments $Self Care/Home Management : 8-22 mins $Therapeutic Activity: 8-22 mins  Barry Brunner, OT Acute Rehabilitation Services Pager [phone] Office [phone]    [name] 11/27/2020, 12:19 PM

## 2020-11-27 NOTE — Progress Notes (Signed)
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 Subsequently on 2/10, she developed obtundation and respiratory arrest leading to intubation. She has remained persistently obtunded, now has a trach and a PEG. Hospital course complicated by persistent fever,Serratia pneumoniaand now pre-orbital cellulitis   Subjective: Awake, nonverbal with me.    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  Per RN, less diarrhea today- cont to hold laxatives today and follow.  Time spent in minutes: 25 DVT prophylaxis: SCD's Start: 08/27/20 0026  Code Status: Full code Family Communication: daughter at bedside Level of Care: Level of care: Progressive Disposition Plan:  Status is: Inpatient  Remains inpatient appropriate because:Inpatient level of care appropriate due to severity of illness   Dispo: The patient is from: Home              Anticipated d/c is to: SNF              Patient currently is medically stable to d/c.   Difficult to place patient Yes  Antimicrobials:   Anti-infectives (From admission, onward)   Start     Dose/Rate Route Frequency Ordered Stop   10/27/20 0845  meropenem (MERREM) 1 g in sodium chloride 0.9 % 100 mL IVPB  Status:  Discontinued        1 g 200 mL/hr over 30 Minutes Intravenous Every 8 hours 10/27/20 0752 11/01/20 0720   10/26/20 1145  cefTRIAXone (ROCEPHIN) 1 g in sodium chloride 0.9 % 100 mL IVPB  Status:  Discontinued        1 g 200 mL/hr over 30 Minutes Intravenous Every 24 hours 10/26/20 1056 10/27/20 0752   10/14/20 1815  Ampicillin-Sulbactam (UNASYN) 3 g in sodium chloride 0.9 % 100 mL IVPB        3 g 200 mL/hr over 30 Minutes Intravenous Every 6 hours 10/14/20 1754 10/19/20 1340   10/08/20 0845  ciprofloxacin (CIPRO) tablet 500 mg  Status:  Discontinued        500 mg Per Tube 2 times daily 10/08/20 0754 10/14/20 1731   09/21/20 1545  ceFAZolin (ANCEF) IVPB 1 g/50 mL premix        1 g 100 mL/hr over 30 Minutes Intravenous Every 8 hours 09/21/20 1457 09/28/20 0647   09/20/20 0600  ceFAZolin (ANCEF) IVPB 1 g/50 mL premix  Status:  Discontinued        1 g 100 mL/hr over 30 Minutes Intravenous Every 8 hours 09/14/20 0903 09/14/20 0904   09/16/20 1300  vancomycin (VANCOREADY) IVPB 1250 mg/250 mL  Status:  Discontinued  1,250 mg 166.7 mL/hr over 90 Minutes Intravenous Every 24 hours 09/15/20 1232 09/17/20 0956   09/15/20 1230  vancomycin (VANCOREADY) IVPB 1250 mg/250 mL        1,250 mg 166.7 mL/hr over 90 Minutes Intravenous  Once 09/15/20 1131 09/15/20 1402   09/13/20 1400  ceFEPIme (MAXIPIME) 2 g in sodium chloride 0.9 % 100 mL IVPB        2 g 200 mL/hr over 30 Minutes Intravenous Every 8 hours 09/13/20 1341 09/19/20 2208   09/06/20 0600  vancomycin (VANCOREADY) IVPB 1250 mg/250 mL        1,250 mg 166.7 mL/hr over 90 Minutes Intravenous  Once 09/06/20 0541 09/06/20 0739   09/05/20 1400  ceFAZolin (ANCEF) IVPB 1 g/50 mL premix  Status:  Discontinued        1 g 100 mL/hr over 30 Minutes Intravenous Every 8 hours  09/05/20 1120 09/13/20 1341   08/30/20 0600  vancomycin (VANCOREADY) IVPB 500 mg/100 mL  Status:  Discontinued        500 mg 100 mL/hr over 60 Minutes Intravenous Every 12 hours 08/29/20 1554 09/01/20 1115   08/29/20 1700  ceFEPIme (MAXIPIME) 2 g in sodium chloride 0.9 % 100 mL IVPB  Status:  Discontinued        2 g 200 mL/hr over 30 Minutes Intravenous Every 12 hours 08/29/20 1554 09/01/20 1115   08/29/20 1645  vancomycin (VANCOREADY) IVPB 1250 mg/250 mL        1,250 mg 166.7 mL/hr over 90 Minutes Intravenous  Once 08/29/20 1554 08/29/20 1820       Objective: Vitals:   11/27/20 0542 11/27/20 0741 11/27/20 0757 11/27/20 1149  BP: (!) 141/95  (!) 143/94 125/87  Pulse:   97 83  Resp:  _0 Temp:   98.4 F (36.9 C) 98.3 F (36.8 C)  TempSrc:   Axillary Axillary  SpO2:   100% 99%  Weight:      Height:       No intake or output data in the 24 hours ending 11/27/20 1311 Filed Weights   11/16/20 0500 11/22/20 0500 11/25/20 0346  Weight: 72.9 kg 76 kg 75.3 kg    Examination: General exam: Appears comfortable  HEENT: PERRL - trach with trach collar present Respiratory system: Clear to auscultation. Respiratory effort normal. Cardiovascular system: S1 & S2 heard, RRR.   Gastrointestinal system: Abdomen soft, non-tender, nondistended. Normal bowel sounds.PEG present.  Central nervous system: Alert  - does not follow commands and nonverbal Extremities: No cyanosis, clubbing or edema Skin: No rashes or ulcers      Data Reviewed: I have personally reviewed following labs and imaging studies  CBC: Recent Labs  Lab 11/26/20 0906  WBC 5.8  HGB 10.8*  HCT 34.1*  MCV 80.2  PLT 482   Basic Metabolic Panel: Recent Labs  Lab 11/26/20 0906  NA 139  K 3.7  CL 100  CO2 30  GLUCOSE 134*  BUN 14  CREATININE 0.51  CALCIUM 9.4   GFR: Estimated Creatinine Clearance: 82.6 mL/min (by C-G formula based on SCr of 0.51 mg/dL). Liver Function Tests: No results for  input(s): AST, ALT, ALKPHOS, BILITOT, PROT, ALBUMIN in the last 168 hours. No results for input(s): LIPASE, AMYLASE in the last 168 hours. No results for input(s): AMMONIA in the last 168 hours. Coagulation Profile: No results for input(s): INR, PROTIME in the last 168 hours. Cardiac Enzymes: No results for input(s): CKTOTAL, CKMB, CKMBINDEX, TROPONINI in the last  168 hours. BNP (last 3 results) No results for input(s): PROBNP in the last 8760 hours. HbA1C: No results for input(s): HGBA1C in the last 72 hours. CBG: Recent Labs  Lab 11/26/20 2015 11/27/20 0015 11/27/20 0401 11/27/20 0756 11/27/20 1147  GLUCAP 134* 123* 119* 135* 144*   Lipid Profile: No results for input(s): CHOL, HDL, LDLCALC, TRIG, CHOLHDL, LDLDIRECT in the last 72 hours. Thyroid Function Tests: No results for input(s): TSH, T4TOTAL, FREET4, T3FREE, THYROIDAB in the last 72 hours. Anemia Panel: No results for input(s): VITAMINB12, FOLATE, FERRITIN, TIBC, IRON, RETICCTPCT in the last 72 hours. Urine analysis:    Component Value Date/Time   COLORURINE YELLOW 10/23/2020 1912   APPEARANCEUR CLOUDY (A) 10/23/2020 1912   LABSPEC 1.013 10/23/2020 1912   PHURINE 6.0 10/23/2020 1912   GLUCOSEU NEGATIVE 10/23/2020 1912   HGBUR NEGATIVE 10/23/2020 1912   BILIRUBINUR NEGATIVE 10/23/2020 1912   KETONESUR NEGATIVE 10/23/2020 1912   PROTEINUR NEGATIVE 10/23/2020 1912   UROBILINOGEN 0.2 02/21/2014 1629   NITRITE NEGATIVE 10/23/2020 1912   LEUKOCYTESUR NEGATIVE 10/23/2020 1912   Sepsis Labs: _0 (procalcitonin:4,lacticidven:4) )No results found for this or any previous visit (from the past 240 hour(s)).       Radiology Studies: No results found.    Scheduled Meds: . amLODipine  10 mg Per Tube Daily  . atorvastatin  40 mg Per Tube Daily  . baclofen  7.5 mg Per Tube 2 times per day  . chlorhexidine gluconate (MEDLINE KIT)  15 mL Mouth Rinse BID  . Chlorhexidine Gluconate Cloth  6 each Topical Daily   . feeding supplement (PROSource TF)  45 mL Per Tube Daily  . free water  100 mL Per Tube Q4H  . hydrALAZINE  50 mg Per Tube Q8H  . insulin aspart  0-9 Units Subcutaneous Q4H  . mouth rinse  15 mL Mouth Rinse 5 X Daily  . metoprolol tartrate  100 mg Per Tube BID  . modafinil  200 mg Per Tube Daily  . oxybutynin  5 mg Per Tube BID  . pantoprazole sodium  40 mg Per Tube BID  . polyethylene glycol  17 g Per Tube Daily  . tiZANidine  2 mg Per Tube QHS   Continuous Infusions: . sodium chloride Stopped (10/26/20 1445)  . feeding supplement (OSMOLITE 1.2 CAL) 1,000 mL (11/26/20 2158)     LOS: 92 days      Debbe Odea, MD [facility] Pager: [email or web address] 11/27/2020, 1:11 PM

## 2020-11-28 DIAGNOSIS — R600 Localized edema: Secondary | ICD-10-CM

## 2020-11-28 DIAGNOSIS — R197 Diarrhea, unspecified: Secondary | ICD-10-CM

## 2020-11-28 LAB — GLUCOSE, CAPILLARY
Glucose-Capillary: 102 mg/dL — ABNORMAL HIGH (ref 70–99)
Glucose-Capillary: 116 mg/dL — ABNORMAL HIGH (ref 70–99)
Glucose-Capillary: 122 mg/dL — ABNORMAL HIGH (ref 70–99)
Glucose-Capillary: 125 mg/dL — ABNORMAL HIGH (ref 70–99)
Glucose-Capillary: 127 mg/dL — ABNORMAL HIGH (ref 70–99)
Glucose-Capillary: 141 mg/dL — ABNORMAL HIGH (ref 70–99)

## 2020-11-28 MED ORDER — POLYETHYLENE GLYCOL 3350 17 G PO PACK
17.0000 g | PACK | ORAL | Status: DC
  Administered 2020-11-28 – 2020-12-04 (×3): 17 g
  Filled 2020-11-28 (×3): qty 1

## 2020-11-28 MED ORDER — POLYETHYLENE GLYCOL 3350 17 G PO PACK
17.0000 g | PACK | ORAL | Status: DC
  Filled 2020-11-28: qty 1

## 2020-11-28 NOTE — Progress Notes (Signed)
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 Subsequently on 2/10, she developed obtundation and respiratory arrest leading to intubation. Now has a trach and a PEG. Hospital course complicated by Serratia pneumonia, ESBL Enterobacter UTIand pre-orbital cellulitis.   She now remains non-verbal. PT/OT and SLO are working with her. She has been following a few commands with therapy and has small independent movements including tracking at times..   Subjective: Non verbal. Mother at bedside.     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  Mild edema of left foot noted-non-tender when examined- elevate foot- d/w mother  Time spent in minutes: 25 DVT prophylaxis: SCD's Start: 08/27/20 0026  Code Status: Full code Family Communication: daughter at bedside Level of Care: Level of care: Progressive Disposition Plan:  Status is: Inpatient  Remains inpatient appropriate because:Inpatient level of care appropriate due to severity of illness   Dispo: The patient is from: Home              Anticipated d/c is to: SNF              Patient currently is medically stable to d/c.   Difficult to place patient Yes  Antimicrobials:  Anti-infectives (From  admission, onward)   Start     Dose/Rate Route Frequency Ordered Stop   10/27/20 0845  meropenem (MERREM) 1 g in sodium chloride 0.9 % 100 mL IVPB  Status:  Discontinued        1 g 200 mL/hr over 30 Minutes Intravenous Every 8 hours 10/27/20 0752 11/01/20 0720   10/26/20 1145  cefTRIAXone (ROCEPHIN) 1 g in sodium chloride 0.9 % 100 mL IVPB  Status:  Discontinued        1 g 200 mL/hr over 30 Minutes Intravenous Every 24 hours 10/26/20 1056 10/27/20 0752   10/14/20 1815  Ampicillin-Sulbactam (UNASYN) 3 g in sodium chloride 0.9 % 100 mL IVPB        3 g 200 mL/hr over 30 Minutes Intravenous Every 6 hours 10/14/20 1754 10/19/20 1340   10/08/20 0845  ciprofloxacin (CIPRO) tablet 500 mg  Status:  Discontinued        500 mg Per Tube 2 times daily 10/08/20 0754 10/14/20 1731   09/21/20 1545  ceFAZolin (ANCEF) IVPB 1 g/50 mL premix        1 g 100 mL/hr over 30 Minutes Intravenous Every 8 hours 09/21/20 1457 09/28/20 0647   09/20/20 0600  ceFAZolin (ANCEF) IVPB 1 g/50 mL premix  Status:  Discontinued        1 g 100 mL/hr over 30 Minutes Intravenous Every 8 hours 09/14/20 0903 09/14/20 0904   09/16/20 1300  vancomycin (VANCOREADY) IVPB 1250 mg/250 mL  Status:  Discontinued        1,250 mg 166.7 mL/hr over  90 Minutes Intravenous Every 24 hours 09/15/20 1232 09/17/20 0956   09/15/20 1230  vancomycin (VANCOREADY) IVPB 1250 mg/250 mL        1,250 mg 166.7 mL/hr over 90 Minutes Intravenous  Once 09/15/20 1131 09/15/20 1402   09/13/20 1400  ceFEPIme (MAXIPIME) 2 g in sodium chloride 0.9 % 100 mL IVPB        2 g 200 mL/hr over 30 Minutes Intravenous Every 8 hours 09/13/20 1341 09/19/20 2208   09/06/20 0600  vancomycin (VANCOREADY) IVPB 1250 mg/250 mL        1,250 mg 166.7 mL/hr over 90 Minutes Intravenous  Once 09/06/20 0541 09/06/20 0739   09/05/20 1400  ceFAZolin (ANCEF) IVPB 1 g/50 mL premix  Status:  Discontinued        1 g 100 mL/hr over 30 Minutes Intravenous Every 8 hours 09/05/20 1120 09/13/20  1341   08/30/20 0600  vancomycin (VANCOREADY) IVPB 500 mg/100 mL  Status:  Discontinued        500 mg 100 mL/hr over 60 Minutes Intravenous Every 12 hours 08/29/20 1554 09/01/20 1115   08/29/20 1700  ceFEPIme (MAXIPIME) 2 g in sodium chloride 0.9 % 100 mL IVPB  Status:  Discontinued        2 g 200 mL/hr over 30 Minutes Intravenous Every 12 hours 08/29/20 1554 09/01/20 1115   08/29/20 1645  vancomycin (VANCOREADY) IVPB 1250 mg/250 mL        1,250 mg 166.7 mL/hr over 90 Minutes Intravenous  Once 08/29/20 1554 08/29/20 1820       Objective: Vitals:   11/28/20 0714 11/28/20 0823 11/28/20 1100 11/28/20 1130  BP: 127/74   136/85  Pulse:  95    Resp: (!) '22 18  14  ' Temp: 99.1 F (37.3 C)   98.5 F (36.9 C)  TempSrc: Oral   Oral  SpO2:  98% 100% 99%  Weight:      Height:        Intake/Output Summary (Last 24 hours) at 11/28/2020 1339 Last data filed at 11/28/2020 0935 Gross per 24 hour  Intake --  Output 300 ml  Net -300 ml   Filed Weights   11/16/20 0500 11/22/20 0500 11/25/20 0346  Weight: 72.9 kg 76 kg 75.3 kg    Examination: General exam: Appears comfortable  HEENT: PERRL - trach with trach collar present Respiratory system: Clear to auscultation. Respiratory effort normal. Cardiovascular system: S1 & S2 heard, RRR.   Gastrointestinal system: Abdomen soft, non-tender, nondistended. Normal bowel sounds.PEG present.  Central nervous system: asleep- aroused only briefly today. Extremities: No cyanosis, clubbing - mild edema of left foot noted-  Skin: No rashes or ulcers      Data Reviewed: I have personally reviewed following labs and imaging studies  CBC: Recent Labs  Lab 11/26/20 0906  WBC 5.8  HGB 10.8*  HCT 34.1*  MCV 80.2  PLT 828   Basic Metabolic Panel: Recent Labs  Lab 11/26/20 0906  NA 139  K 3.7  CL 100  CO2 30  GLUCOSE 134*  BUN 14  CREATININE 0.51  CALCIUM 9.4   GFR: Estimated Creatinine Clearance: 82.6 mL/min (by C-G formula based on  SCr of 0.51 mg/dL). Liver Function Tests: No results for input(s): AST, ALT, ALKPHOS, BILITOT, PROT, ALBUMIN in the last 168 hours. No results for input(s): LIPASE, AMYLASE in the last 168 hours. No results for input(s): AMMONIA in the last 168 hours. Coagulation Profile: No results for input(s): INR, PROTIME in the  last 168 hours. Cardiac Enzymes: No results for input(s): CKTOTAL, CKMB, CKMBINDEX, TROPONINI in the last 168 hours. BNP (last 3 results) No results for input(s): PROBNP in the last 8760 hours. HbA1C: No results for input(s): HGBA1C in the last 72 hours. CBG: Recent Labs  Lab 11/27/20 1941 11/27/20 2333 11/28/20 0338 11/28/20 0712 11/28/20 1126  GLUCAP 118* 159* 116* 125* 122*   Lipid Profile: No results for input(s): CHOL, HDL, LDLCALC, TRIG, CHOLHDL, LDLDIRECT in the last 72 hours. Thyroid Function Tests: No results for input(s): TSH, T4TOTAL, FREET4, T3FREE, THYROIDAB in the last 72 hours. Anemia Panel: No results for input(s): VITAMINB12, FOLATE, FERRITIN, TIBC, IRON, RETICCTPCT in the last 72 hours. Urine analysis:    Component Value Date/Time   COLORURINE YELLOW 10/23/2020 1912   APPEARANCEUR CLOUDY (A) 10/23/2020 1912   LABSPEC 1.013 10/23/2020 1912   PHURINE 6.0 10/23/2020 1912   GLUCOSEU NEGATIVE 10/23/2020 1912   HGBUR NEGATIVE 10/23/2020 1912   BILIRUBINUR NEGATIVE 10/23/2020 1912   KETONESUR NEGATIVE 10/23/2020 1912   PROTEINUR NEGATIVE 10/23/2020 1912   UROBILINOGEN 0.2 02/21/2014 1629   NITRITE NEGATIVE 10/23/2020 1912   LEUKOCYTESUR NEGATIVE 10/23/2020 1912   Sepsis Labs: '@LABRCNTIP' (procalcitonin:4,lacticidven:4) )No results found for this or any previous visit (from the past 240 hour(s)).       Radiology Studies: No results found.    Scheduled Meds: . amLODipine  10 mg Per Tube Daily  . atorvastatin  40 mg Per Tube Daily  . baclofen  7.5 mg Per Tube 2 times per day  . chlorhexidine gluconate (MEDLINE KIT)  15 mL Mouth Rinse BID   . Chlorhexidine Gluconate Cloth  6 each Topical Daily  . feeding supplement (PROSource TF)  45 mL Per Tube Daily  . free water  100 mL Per Tube Q4H  . hydrALAZINE  50 mg Per Tube Q8H  . insulin aspart  0-9 Units Subcutaneous Q4H  . mouth rinse  15 mL Mouth Rinse 5 X Daily  . metoprolol tartrate  100 mg Per Tube BID  . modafinil  200 mg Per Tube Daily  . oxybutynin  5 mg Per Tube BID  . pantoprazole sodium  40 mg Per Tube BID  . polyethylene glycol  17 g Oral QODAY  . tiZANidine  2 mg Per Tube QHS   Continuous Infusions: . sodium chloride Stopped (10/26/20 1445)  . feeding supplement (OSMOLITE 1.2 CAL) 1,000 mL (11/28/20 1239)     LOS: 93 days      Debbe Odea, MD [facility] Pager: [email or web address] 11/28/2020, 1:39 PM

## 2020-11-29 LAB — GLUCOSE, CAPILLARY
Glucose-Capillary: 154 mg/dL — ABNORMAL HIGH (ref 70–99)
Glucose-Capillary: 128 mg/dL — ABNORMAL HIGH (ref 70–99)
Glucose-Capillary: 106 mg/dL — ABNORMAL HIGH (ref 70–99)
Glucose-Capillary: 140 mg/dL — ABNORMAL HIGH (ref 70–99)
Glucose-Capillary: 113 mg/dL — ABNORMAL HIGH (ref 70–99)
Glucose-Capillary: 170 mg/dL — ABNORMAL HIGH (ref 70–99)

## 2020-11-29 NOTE — Progress Notes (Signed)
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 Subsequently on 2/10, she developed obtundation and respiratory arrest leading to intubation. Now has a trach and a PEG. Hospital course complicated by Serratia pneumonia, ESBL Enterobacter UTIand pre-orbital cellulitis.   She now remains non-verbal. PT/OT and SLO are working with her. She has been following a few commands with therapy and has small independent movements including tracking at times..   Subjective: Non verbal.     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 No recurrence of diarrhea per RN. She had 1 BM last night. Mild edema of left foot noted on 5/14-non-tender when examined- elevate foot- d/w mother- today there is no further edema  Time spent in minutes: 25 DVT prophylaxis: SCD's Start: 08/27/20 0026  Code Status: Full code Family Communication: daughter at bedside Level of Care: Level of care: Telemetry Medical Disposition Plan:  Status is: Inpatient  Remains inpatient appropriate because:Inpatient level of care appropriate due to severity of illness   Dispo: The patient is from: Home              Anticipated d/c is to: SNF              Patient currently is medically stable to  d/c.   Difficult to place patient Yes  Antimicrobials:  Anti-infectives (From admission, onward)   Start     Dose/Rate Route Frequency Ordered Stop   10/27/20 0845  meropenem (MERREM) 1 g in sodium chloride 0.9 % 100 mL IVPB  Status:  Discontinued        1 g 200 mL/hr over 30 Minutes Intravenous Every 8 hours 10/27/20 0752 11/01/20 0720   10/26/20 1145  cefTRIAXone (ROCEPHIN) 1 g in sodium chloride 0.9 % 100 mL IVPB  Status:  Discontinued        1 g 200 mL/hr over 30 Minutes Intravenous Every 24 hours 10/26/20 1056 10/27/20 0752   10/14/20 1815  Ampicillin-Sulbactam (UNASYN) 3 g in sodium chloride 0.9 % 100 mL IVPB        3 g 200 mL/hr over 30 Minutes Intravenous Every 6 hours 10/14/20 1754 10/19/20 1340   10/08/20 0845  ciprofloxacin (CIPRO) tablet 500 mg  Status:  Discontinued        500 mg Per Tube 2 times daily 10/08/20 0754 10/14/20 1731   09/21/20 1545  ceFAZolin (ANCEF) IVPB 1 g/50 mL premix        1 g 100 mL/hr over 30 Minutes Intravenous Every 8 hours 09/21/20 1457 09/28/20 0647   09/20/20 0600  ceFAZolin (ANCEF) IVPB 1 g/50 mL premix  Status:  Discontinued        1 g 100 mL/hr over 30 Minutes Intravenous Every 8 hours 09/14/20 0903 09/14/20 0904   09/16/20 1300  vancomycin (VANCOREADY) IVPB 1250 mg/250  mL  Status:  Discontinued        1,250 mg 166.7 mL/hr over 90 Minutes Intravenous Every 24 hours 09/15/20 1232 09/17/20 0956   09/15/20 1230  vancomycin (VANCOREADY) IVPB 1250 mg/250 mL        1,250 mg 166.7 mL/hr over 90 Minutes Intravenous  Once 09/15/20 1131 09/15/20 1402   09/13/20 1400  ceFEPIme (MAXIPIME) 2 g in sodium chloride 0.9 % 100 mL IVPB        2 g 200 mL/hr over 30 Minutes Intravenous Every 8 hours 09/13/20 1341 09/19/20 2208   09/06/20 0600  vancomycin (VANCOREADY) IVPB 1250 mg/250 mL        1,250 mg 166.7 mL/hr over 90 Minutes Intravenous  Once 09/06/20 0541 09/06/20 0739   09/05/20 1400  ceFAZolin (ANCEF) IVPB 1 g/50 mL premix  Status:  Discontinued         1 g 100 mL/hr over 30 Minutes Intravenous Every 8 hours 09/05/20 1120 09/13/20 1341   08/30/20 0600  vancomycin (VANCOREADY) IVPB 500 mg/100 mL  Status:  Discontinued        500 mg 100 mL/hr over 60 Minutes Intravenous Every 12 hours 08/29/20 1554 09/01/20 1115   08/29/20 1700  ceFEPIme (MAXIPIME) 2 g in sodium chloride 0.9 % 100 mL IVPB  Status:  Discontinued        2 g 200 mL/hr over 30 Minutes Intravenous Every 12 hours 08/29/20 1554 09/01/20 1115   08/29/20 1645  vancomycin (VANCOREADY) IVPB 1250 mg/250 mL        1,250 mg 166.7 mL/hr over 90 Minutes Intravenous  Once 08/29/20 1554 08/29/20 1820       Objective: Vitals:   11/29/20 0718 11/29/20 0803 11/29/20 0817 11/29/20 1107  BP: 102/75 111/63  114/82  Pulse:  89    Resp:      Temp: 98.7 F (37.1 C)   98.2 F (36.8 C)  TempSrc: Axillary   Axillary  SpO2:  97% 98%   Weight:      Height:        Intake/Output Summary (Last 24 hours) at 11/29/2020 1121 Last data filed at 11/29/2020 1111 Gross per 24 hour  Intake 3300.92 ml  Output 1400 ml  Net 1900.92 ml   Filed Weights   11/16/20 0500 11/22/20 0500 11/25/20 0346  Weight: 72.9 kg 76 kg 75.3 kg    Examination: General exam: Appears comfortable  HEENT: PERRL- trach, passy-muir valve and trach collar present Respiratory system: Clear to auscultation. Respiratory effort normal. Cardiovascular system: S1 & S2 heard, regular rate and rhythm Gastrointestinal system: Abdomen soft, non-tender, nondistended. Normal bowel sounds  - PEG tube noted Central nervous system: -asleep, awakens briefly, non verbal, not following commands or moving extremities on her own Extremities: No cyanosis, clubbing or edema Skin: No rashes or ulcers Psychiatry:  unable to assess      Data Reviewed: I have personally reviewed following labs and imaging studies  CBC: Recent Labs  Lab 11/26/20 0906  WBC 5.8  HGB 10.8*  HCT 34.1*  MCV 80.2  PLT 741   Basic Metabolic Panel: Recent  Labs  Lab 11/26/20 0906  NA 139  K 3.7  CL 100  CO2 30  GLUCOSE 134*  BUN 14  CREATININE 0.51  CALCIUM 9.4   GFR: Estimated Creatinine Clearance: 82.6 mL/min (by C-G formula based on SCr of 0.51 mg/dL). Liver Function Tests: No results for input(s): AST, ALT, ALKPHOS, BILITOT, PROT, ALBUMIN in the last 168 hours. No  results for input(s): LIPASE, AMYLASE in the last 168 hours. No results for input(s): AMMONIA in the last 168 hours. Coagulation Profile: No results for input(s): INR, PROTIME in the last 168 hours. Cardiac Enzymes: No results for input(s): CKTOTAL, CKMB, CKMBINDEX, TROPONINI in the last 168 hours. BNP (last 3 results) No results for input(s): PROBNP in the last 8760 hours. HbA1C: No results for input(s): HGBA1C in the last 72 hours. CBG: Recent Labs  Lab 11/28/20 2024 11/28/20 2335 11/29/20 0407 11/29/20 0716 11/29/20 1106  GLUCAP 102* 127* 154* 128* 106*   Lipid Profile: No results for input(s): CHOL, HDL, LDLCALC, TRIG, CHOLHDL, LDLDIRECT in the last 72 hours. Thyroid Function Tests: No results for input(s): TSH, T4TOTAL, FREET4, T3FREE, THYROIDAB in the last 72 hours. Anemia Panel: No results for input(s): VITAMINB12, FOLATE, FERRITIN, TIBC, IRON, RETICCTPCT in the last 72 hours. Urine analysis:    Component Value Date/Time   COLORURINE YELLOW 10/23/2020 1912   APPEARANCEUR CLOUDY (A) 10/23/2020 1912   LABSPEC 1.013 10/23/2020 1912   PHURINE 6.0 10/23/2020 1912   GLUCOSEU NEGATIVE 10/23/2020 1912   HGBUR NEGATIVE 10/23/2020 1912   BILIRUBINUR NEGATIVE 10/23/2020 1912   KETONESUR NEGATIVE 10/23/2020 1912   PROTEINUR NEGATIVE 10/23/2020 1912   UROBILINOGEN 0.2 02/21/2014 1629   NITRITE NEGATIVE 10/23/2020 1912   LEUKOCYTESUR NEGATIVE 10/23/2020 1912   Sepsis Labs: _0 (procalcitonin:4,lacticidven:4) )No results found for this or any previous visit (from the past 240 hour(s)).       Radiology Studies: No results  found.    Scheduled Meds: . amLODipine  10 mg Per Tube Daily  . atorvastatin  40 mg Per Tube Daily  . baclofen  7.5 mg Per Tube 2 times per day  . chlorhexidine gluconate (MEDLINE KIT)  15 mL Mouth Rinse BID  . Chlorhexidine Gluconate Cloth  6 each Topical Daily  . feeding supplement (PROSource TF)  45 mL Per Tube Daily  . free water  100 mL Per Tube Q4H  . hydrALAZINE  50 mg Per Tube Q8H  . insulin aspart  0-9 Units Subcutaneous Q4H  . mouth rinse  15 mL Mouth Rinse 5 X Daily  . metoprolol tartrate  100 mg Per Tube BID  . modafinil  200 mg Per Tube Daily  . oxybutynin  5 mg Per Tube BID  . pantoprazole sodium  40 mg Per Tube BID  . polyethylene glycol  17 g Per Tube QODAY  . tiZANidine  2 mg Per Tube QHS   Continuous Infusions: . sodium chloride Stopped (10/26/20 1445)  . feeding supplement (OSMOLITE 1.2 CAL) 1,000 mL (11/28/20 1239)     LOS: 94 days      Debbe Odea, MD [facility] Pager: [email or web address] 11/29/2020, 11:21 AM

## 2020-11-30 LAB — GLUCOSE, CAPILLARY
Glucose-Capillary: 140 mg/dL — ABNORMAL HIGH (ref 70–99)
Glucose-Capillary: 133 mg/dL — ABNORMAL HIGH (ref 70–99)
Glucose-Capillary: 141 mg/dL — ABNORMAL HIGH (ref 70–99)
Glucose-Capillary: 118 mg/dL — ABNORMAL HIGH (ref 70–99)
Glucose-Capillary: 141 mg/dL — ABNORMAL HIGH (ref 70–99)

## 2020-11-30 NOTE — Progress Notes (Signed)
Physical Therapy Treatment Patient Details Name: Charlene [name] MRN: [id number] DOB: 1965-08-18 Today's Date: 11/30/2020    History of Present Illness Pt is 55 y/o female [facility] with L sided weakness and headaches. 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 s/p Right frontal ventricular catheter placement on 2/10, stopped functioning on 2/11.  MRI 2/11 shows right greater than left mesial temporal lobes / parahippocampal cortex, bilateral basal ganglia, and possibly the upper pons that are concerning for acute infarcts, most likely secondary to mass effect from the hemorrhage. ETT 2/10-2/24.  Pt with trach and PEG on 09/10/20. Hospital course complicated by persistent fever, Serratia pneumonia and now pre-orbital cellulitis.   PMH includes anxiety, HTN.    PT Comments    Charlene [name] Plus utilized to attempt standing trials and increase weight bearing through LE's. We were somewhat successful, however feel a tilt bed would provide more control and the ability to protect shoulders and knees more with proper alignment and positioning throughout trials. Husband, Charlene [name], present and supportive to the pt, and assisting therapists as needed. Will continue to follow and progress as able per POC.     Follow Up Recommendations  SNF     Equipment Recommendations  Hospital bed;Wheelchair cushion (measurements PT);Wheelchair (measurements PT)    Recommendations for Other Services Rehab consult     Precautions / Restrictions Precautions Precautions: Fall Precaution Comments: peg and trach Required Braces or Orthoses: Other Brace Restrictions Weight Bearing Restrictions: No    Mobility  Bed Mobility Overal bed mobility: Needs Assistance Bed Mobility: Rolling;Sidelying to Sit;Sit to Supine Rolling: Total assist Sidelying to sit: Total assist;+2  for physical assistance   Sit to supine: Total assist;+2 for physical assistance   General bed mobility comments: Total assist required for rolling and transition to/from EOB. Pt with bowel movement and rolled R and L for peri-care. Pt was able to hold to railing with R hand but unable to pull herself over during turn.    Transfers Overall transfer level: Needs assistance Equipment used: Ambulation equipment used Transfers: Sit to/from Stand Sit to Stand: Total assist;+2 physical assistance;+2 safety/equipment         General transfer comment: Charlene [name] Plus utilized with walking sling to attempt sit<>stand x2 at EOB. Pt was able to get UE's in position, with R hand grasping the vertical handle. She was able to get hand around vertical handle on the L however wrist and hand malaligned due to forearm pronation, and tone pulling wrist into extension.  Ambulation/Gait             General Gait Details: non ambulatory   Stairs             Wheelchair Mobility    Modified Rankin (Stroke Patients Only)       Balance Overall balance assessment: Needs assistance Sitting-balance support: Single extremity supported;Feet supported Sitting balance-Leahy Scale: Poor Sitting balance - Comments: as good as min assist at EOB Postural control: Right lateral lean                                  Cognition Arousal/Alertness: Awake/alert Behavior During Therapy: Flat affect Overall Cognitive Status: Impaired/Different from baseline Area of Impairment: Following commands;Awareness;Attention  Orientation Level: Place;Time;Situation Current Attention Level: Focused Memory: Decreased short-term memory Following Commands: Follows one step commands inconsistently;Follows one step commands with increased time Safety/Judgement: Decreased awareness of safety;Decreased awareness of deficits Awareness: Intellectual Problem Solving: Slow  processing;Decreased initiation;Difficulty sequencing;Requires verbal cues;Requires tactile cues General Comments: Tracks people around room - mostly husband, Charlene [name]. Pt did track therapist past midline x3 at beginning of session. Appeared to raise RUE to command, however to repeat this, required assist and increased time.      Exercises Other Exercises Other Exercises: B knee extension stretch, B hip flexor stretch (modified), and B gastroc stretch    General Comments        Pertinent Vitals/Pain Pain Assessment: Faces Faces Pain Scale: Hurts little more Pain Location: General grimacing with movement Pain Descriptors / Indicators: Grimacing;Discomfort Pain Intervention(s): Limited activity within patient's tolerance;Monitored during session;Repositioned    Home Living                      Prior Function            PT Goals (current goals can now be found in the care plan section) Acute Rehab PT Goals Patient Stated Goal: Pt unable to state PT Goal Formulation: Patient unable to participate in goal setting Time For Goal Achievement: 12/14/20 Potential to Achieve Goals: Poor Additional Goals Additional Goal #1: Provide positioning, mobility, and transfer education to family and have family participate in these activities during PT Progress towards PT goals: Progressing toward goals    Frequency    Min 2X/week      PT Plan Current plan remains appropriate    Co-evaluation              AM-PAC PT "6 Clicks" Mobility   Outcome Measure  Help needed turning from your back to your side while in a flat bed without using bedrails?: Total Help needed moving from lying on your back to sitting on the side of a flat bed without using bedrails?: Total Help needed moving to and from a bed to a chair (including a wheelchair)?: Total Help needed standing up from a chair using your arms (e.g., wheelchair or bedside chair)?: Total Help needed to walk in hospital room?:  Total Help needed climbing 3-5 steps with a railing? : Total 6 Click Score: 6    End of Session Equipment Utilized During Treatment: Gait belt Activity Tolerance: Patient tolerated treatment well Patient left: in bed;with call bell/phone within reach;with family/visitor present Nurse Communication: Mobility status;Need for lift equipment PT Visit Diagnosis: Hemiplegia and hemiparesis;Other symptoms and signs involving the nervous system (R29.898);Other abnormalities of gait and mobility (R26.89) Hemiplegia - Right/Left: Right Hemiplegia - dominant/non-dominant: Dominant Hemiplegia - caused by: Cerebral infarction;Nontraumatic intracerebral hemorrhage;Other Nontraumatic intracranial hemorrhage     Time: 1355-1450 PT Time Calculation (min) (ACUTE ONLY): 55 min  Charges:  $Therapeutic Exercise: 8-22 mins $Therapeutic Activity: 38-52 mins                     Charlene [name], PT, DPT Acute Rehabilitation Services Pager: [phone] Office: [phone]    Charlene [name] 11/30/2020, 3:24 PM

## 2020-11-30 NOTE — Progress Notes (Signed)
NAME:  Charlene [name], MRN:  [id number], DOB:  08-15-1965, LOS: 95 ADMISSION DATE:  08/26/2020, CONSULTATION DATE:  2/10 REFERRING MD:  Dr. Roda Shutters, CHIEF COMPLAINT:  ICH   Brief History:  55 year old female admitted with hypertensive R thalamic/basal ganglia ICH. Resultant obstructive hydrocephalus prompting EVD placement 2/10. Subsequently, 2/10 she had acute mental status change to obtundation and respiratory arrest resulting in intubation. Trach and Peg 2/24. Trach change to #6 cuffless on 3/11.   Past Medical History:  HTN, medication noncompliance  Significant Hospital Events:  2/09 Admit for ICH 2/10 EVD placed, later intubated due to obtundation and respiratory arrest 2/11 MRI Brain, hypertonic saline d/c'd, EVD not working.  Husband did not consent to PICC  2/13 EVD removed 2/17 GOC discussion with family, PMT 3/11 trach changed to [place] 6 uncuffed 4/18 28% ATC / 5L  5/8 downsized trach to 4 5/16 tolerating z 4 well. No real functional improvements since 5/8 Consults:  Stroke PCCM Neurosurgery  Procedures:  ETT 2/10 >> 09/10/2020 EVD 2/10 > non functioning 2/11 at 1500; clamped 2/12 am > 2/13 Trach (Dr. Denese Killings) 2/24 >>  PEG placement (Dr. Sheliah Hatch) 2/24 >>   Significant Diagnostic Tests:  CT Head 2/9 >> Acute hemorrhage with the epicenter in the right thalamus. Measurement is approximately 3.2 x 2.1 x 2.5 cm (volume 8.8 cm^3). Intraventricular penetration with blood filling the third and fourth ventricles and nearly filling the right lateral ventricle. No hydrocephalus. CTA Head/Neck 2/10 >> Unchanged size of intraparenchymal hemorrhage centered in the right thalamus and basal ganglia with extension into the right lateral ventricle. 2. No intracranial arterial occlusion or high-grade stenosis. No dissection, aneurysm or hemodynamically significant stenosis of the carotid or vertebral arteries. CT Head 2/10 >> Stable parenchymal hemorrhage in the right thalamus and basal  ganglia with intraventricular extension as described. No new focal abnormality is noted. CT Head 2/10 (post intubation) >> redemonstrated intraparenchymal hemorrhage centered within the R thalamus/basal ganglia extending into the cerebral peduncle/midbrain with associated intraventricular extension and hemorrhage in the R lateral ventricle and fourth ventricle, overall not significantly changed from prior; stable mass effect, 57mm of R to L midline shift, postsurgical changes of ventriculostomy TTE 2/10 >> LVEF 60 to 65%, LV has normal function, no regional wall motion abnormalities, mild concentric LVH.  RV systolic function is normal, normal pulmonary artery systolic pressure.  MRA head 2/11 >>Negative intracranial MR angiography. No evidence of aneurysm or high flow vascular malformation to explain the intraparenchymal hemorrhage  MRI brain 2/11 >> Similar intraparenchymal hemorrhage centered within the right thalamus and basal ganglia extending into the right midbrain with intraventricular extension, effacement of the prepontine cistern and similar 4 mm of leftward midline shift; separate areas of restricted diffusion and edema involving the R > L mesial temporal lobes/parahippocampal cortex, bilateral basal ganglia, and possibly the upper pons that are c/f acute infarcts, most likely secondary to mass effect from hemorrhage. MRI brain 2/19 >> intraventricular clot has decreased in size but mid-brain parenchymal clot has not and there are more ischemic infarcts in the surrounding brain.  CT Head WO Contrast 09/07/20>> Expected evolution of intracranial hemorrhage since 08/30/2020 head CT. Multiple areas of low attenuation corresponding to some of the infarcts seen on prior MRI. Ventricle caliber similar to prior MRI   Micro Data:  2/9 COVID + Influenza negative 2/10 MRSA PCR negative 2/12 BCx 2 > no growth  2/19 BC 1/2 +Stap EPI, 1/2 no growth  10/07/2020 sputum positive for Serratia marcescens 4/8  UC >> enterobacter cloacae >> S-gent, imipenem. Otherwise resistant.   Antimicrobials:  Cefepime 2/12 > 2/15 Vanc 2/12 > 2/15 Ciprofloxacin 3/24 >> 3/30   Interim History / Subjective:  Reclined in bed no distress  Not tracking   Objective   Blood pressure (!) 150/79, pulse 88, temperature 98.1 F (36.7 C), temperature source Oral, resp. rate 18, height 5\' 5"  (1.651 m), weight 75.3 kg, last menstrual period 03/03/2014, SpO2 99 %, unknown if currently breastfeeding.    FiO2 (%):  [21 %] 21 %   Intake/Output Summary (Last 24 hours) at 11/30/2020 0736 Last data filed at 11/30/2020 0400 Gross per 24 hour  Intake 1539.08 ml  Output 2350 ml  Net -810.92 ml   Filed Weights   11/16/20 0500 11/22/20 0500 11/25/20 0346  Weight: 72.9 kg 76 kg 75.3 kg   Exam:  General: middle aged F reclined in bed NAD  HEENT [place]. Sz 4 cuffless trach in place. Symmetrical chest expansion, diminished bibasilar sounds, no accessory use on ATC  CV: rrr cap refill < 3 seconds   Abdomen soft ndnt  Extremities no acute deformity, warm, no cyanosis. Decreased muscle mass  Neuro Awake Not tracking or following commands. Disconjugate gaze  GU defer    Resolved Hospital Problem list   Hypernatremia  Obstructive hydrocephalus (resolved) Hypertensive emergency Acute hypoxemic respiratory failure secondary to CVA Serratia PNA  Bacterial Conjunctivitis   Assessment & Plan:    Tracheostomy dependence post CVA  -continues to have poor airway protection, worse deconditioning -Unless pt had a significant neuro recovery, I don't think decannulation would be a safe consideration unless done with plan for transition to comfort care if decompensated, w/ no intubation, no trach revision)  Plan -Cont routine trach care, sz 4 cuffless  -SLP following -Not a good candidate for decannulation  -if family felt strongly about working toward decannulation, need PCM/GOC consult     Tracheostomy Dependent s/p Multiple  Acute Strokes c/b Intraparenchymal Hemorrhage with Right Thalamic Hemorrhage with extension into the third fourth and right lateral ventricle Torticollis/neck contracture  Dysphagia s/p PEG Anemia Sepsis 2/2 Enterobacter Urinary tract infection (new 4/8)    6/8 MSN, AGACNP-BC [facility] Amion for pager details 11/30/2020, 7:36 AM

## 2020-11-30 NOTE — Progress Notes (Signed)
[facility] PROGRESS NOTE  Charlene [name] VOJ:[id number] DOB: [birth year] DOA: 08/26/2020 PCP: Patient, [name] (Inactive)    October 08, 2020     10/16/2020                        10/16/2020     10/18/2020                        10/20/2020    Status: Remains inpatient appropriate because:Altered mental status, Unsafe d/c plan, IV treatments appropriate due to intensity of illness or inability to take PO and Inpatient level of care appropriate due to severity of illness   Dispo: The patient is from: Home              Anticipated d/c is to: SNF trach capable-[place]              Patient currently is medically stable to d/c.   Difficult to place patient Yes   Level of care: Telemetry Medical  Code Status: Full Family Communication: Checked information: [name] [phone]); [name] [phone]) is her POA and legal contact.  5/9 other at bedside.  Also case management updated [name] and was requesting answer regarding discharge disposition.  5/11 spoke with daughter since I had been informed after hours on 5/10 that she was at the bedside and wished to speak to a provider and specifically requested me.  When I called her back on 5/11 he stated she had not asked to speak to me.  Stated I was recontacting her since I knew she was responsible for following up on the [place] applications.  She states that she is no longer involved in this process and request that we contact patient's [name] in the future. DVT prophylaxis: Chronically bedbound.  5/6: We will discontinue prophylactic Lovenox   Vaccination status: Unvaccinated-have discussed both with patient's mother and with [name].  He wishes to speak to the rest of the family before making a decision but is leaning towards [name] vaccine.  Micro Data:  2/9 COVID + Influenza negative 2/10 MRSA PCR negative 2/12 BCx 2 > no growth  2/19  BC 1/2 +Stap EPI, 1/2 no growth  10/07/2020 sputum positive for Serratia marcescens 4/8 Enterobacter cloaca + urine culture MDR sensitive only to imipenem and gentamicin   Antimicrobials:  Cefepime 2/12 > 2/15 Vanc 2/12 > 2/15 10/08/2020 ciprofloxacin>>3/30 Unasyn 3/30 >> 4/02 Rocephin 4/11 > 4/12 Meropenem 4/12>4/17   HPI: 55 yo F with 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  Subsequently on 2/10, she developed obtundation and respiratory arrest leading to intubation.  She has remained persistently obtunded, now has a trach and a PEG.  Hospital course complicated by persistent fever, Serratia pneumonia and now pre-orbital cellulitis  As of 3/25 with adjustments in hypertonicity medications patient began to improve regarding hypertonicity.  Eventually started on provigil with improvements in alertness.  Evaluated by rehab physician who recommended increasing provigil dose further.  Subsequently patient has become more alert.  Most days she is able to track and with encouragement able to follow simple commands especially since partial obstruction glasses placed.  See progress note dictated 4/27 for expanded details.   2/09 Admit for [place] 2/10 EVD placed, later intubated due to obtundation and respiratory arrest 2/11 MRI Brain, hypertonic saline d/c'd, EVD not  working.  Husband did not consent to PICC  2/13 EVD removed 2/17 [place] discussion with family, PMT 2/24 Bedside trach and PEG 3/1 Transferred to [facility]  Subjective: Awakened from sleep.  Startled initially and had some incomprehensible mumbling.  Unable to get patient to reproduce mumbling when asked continues to not follow commands.  Does track persons in room.  Prefers to focus gaze on large Mother's Day card that has pictures of multiple family members.  No visitors in room.  Objective: Vitals:   11/30/20 0411 11/30/20 0804  BP: (!) 150/79 126/71   Pulse: 88 93  Resp: 18 20  Temp:  98.5 F (36.9 C)  SpO2: 99% 98%    Intake/Output Summary (Last 24 hours) at 11/30/2020 0809 Last data filed at 11/30/2020 0400 Gross per 24 hour  Intake 1539.08 ml  Output 1850 ml  Net -310.92 ml   Filed Weights   11/16/20 0500 11/22/20 0500 11/25/20 0346  Weight: 72.9 kg 76 kg 75.3 kg    Exam:  Constitutional: Awaken from sleep.  No acute distress Respiratory: Downsized to #4.0 cuffless trach, trach collar with FiO2 21% at 5L,O2 sats 97 to 99%.  Anterior lung sounds clear to auscultation.  No drooling and managing oral secretions. Cardiovascular: Normal heart sounds, normotensive, no significant peripheral edema noted Abdomen:  PEG tube tube feedings infusing-LBM 5/14, soft nontender.  Previous diarrhea has resolved Musculoskeletal: Bilateral atrophy of thigh and calf muscles Neurologic: CN 2-12 intact except notable with left ocular disconjugate gaze with medial right downward gaze. Inconsistently tracks with right eye.  Psych: Awake.  Unable to accurately assess orientation since she is nonverbal.   Assessment/Plan: Acute problems: Multiple acute strokes  Right Thalamus ICH with IVH  Obstructive Hydrocephalus/Acute on persistent metabolic Encephalopathy/brain injury related hypertonicity -Continue baclofen and Provigil -Had diarrhea secondary to laxatives therefore will schedule has been adjusted to labs daily as needed -Continue PT/OT/SLP.  Will need to continue these therapies once discharged to SNF; SLP services will primarily be focused on Passy-Muir valve training -5/2, CIR MD reevaluated patient " Charlene [name] unfortunately has not made any meaningful functional gains over the last month. She remains most appropriate for SNF level care".   Neck Contracture/torticollis -- continue PT/OT and other supportive care  Hypertensive emergency/Malignant HTN -Due to financial constraints patient was not taking carvedilol and  hydrochlorothiazide prior to admission -Continue Norvasc, Lopressor and hydralazine.   Acute hypoxemic respiratory failure secondary to CVA requiring mechanical ventilation (Resolved) Trach dependent -Downsized to #4 cuffless trach on 5/8 -Per trach team: Has significant altered mentation and inability to manage oral secretions at present time is not a safe candidate for decannulation.  Discussed this with family as major obstacle in discharging to home.  This will also decrease options for SNF placement. -5/9: Reevaluated by trach team.  Again they reiterate patient is not a good candidate for decannulation "unless the family is prepared to transition to comfort should she deteriorate.".  PCCM will continue to follow weekly  Dysphagia -Continue tube feedings, Prosource liquid, free water -Follow CBGs and provide SSI      Other problems: Right upper extremity thrombophlebitis - [name] -> age indeterminate SVT of right cephalic vein  ESBL positive Enterobacter UTI -Has completed 5 days of meropenem -Contact isolation during hospitalization -continue Oxybutynin suspected bladder spasms  Bacterial Conjunctivitis /periorbital cellulitis -Resolved  Chronic pansinusitis/recurrent fevers 2/2 aspiration PNA;History of Serratia Pneumonia  Hospital Acquired Pneumonia:  -Completed several courses of antibiotic -Follow-up imaging including sinus CT consistent  with chronic but stable and resolving sinusitis   Anemia -Stable  HLD -Continue Lipitor      Data Reviewed: Basic Metabolic Panel: Recent Labs  Lab 11/26/20 0906  NA 139  K 3.7  CL 100  CO2 30  GLUCOSE 134*  BUN 14  CREATININE 0.51  CALCIUM 9.4   Liver Function Tests: No results for input(s): AST, ALT, ALKPHOS, BILITOT, PROT, ALBUMIN in the last 168 hours. CBC: Recent Labs  Lab 11/26/20 0906  WBC 5.8  HGB 10.8*  HCT 34.1*  MCV 80.2  PLT 337    CBG: Recent Labs  Lab 11/29/20 1106 11/29/20 1505  11/29/20 2007 11/29/20 2304 11/30/20 0330  GLUCAP 106* 140* 113* 170* 140*     Studies: No results found.  Scheduled Meds: . amLODipine  10 mg Per Tube Daily  . atorvastatin  40 mg Per Tube Daily  . baclofen  7.5 mg Per Tube 2 times per day  . chlorhexidine gluconate (MEDLINE KIT)  15 mL Mouth Rinse BID  . Chlorhexidine Gluconate Cloth  6 each Topical Daily  . feeding supplement (PROSource TF)  45 mL Per Tube Daily  . free water  100 mL Per Tube Q4H  . hydrALAZINE  50 mg Per Tube Q8H  . insulin aspart  0-9 Units Subcutaneous Q4H  . mouth rinse  15 mL Mouth Rinse 5 X Daily  . metoprolol tartrate  100 mg Per Tube BID  . modafinil  200 mg Per Tube Daily  . oxybutynin  5 mg Per Tube BID  . pantoprazole sodium  40 mg Per Tube BID  . polyethylene glycol  17 g Per Tube QODAY  . tiZANidine  2 mg Per Tube QHS   Continuous Infusions: . sodium chloride Stopped (10/26/20 1445)  . feeding supplement (OSMOLITE 1.2 CAL) 1,000 mL (11/29/20 1703)    Principal Problem:   ICH (intracerebral hemorrhage) ([place]) Active Problems:   Acute hypoxemic respiratory failure (HCC)   Status post tracheostomy ([place])   Palliative care by specialist   Muscle hypertonicity   Oropharyngeal dysphagia   Poor prognosis   Aspiration pneumonia of right lower lobe ([place])   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   Acute cystitis without hematuria   Consultants:  Neurology   Neurosurgery.   Palliative care.    PCCM continues to follow.  No decannulation.   Procedures:  Tracheostomy  PEG tube  EEG  Echocardiogram  Antibiotics: Vancomycin 2/12-2/15 Maxipime 2/12-2/15 Ancef 2/19-2/27 Vancomycin 2/20 x 1 dose Maxipime 2/27- 3/5 Vancomycin 3/1-3/3 Ancef 3/6-3/14 Cipro 3/20 4-3/30 Unasyn 3/30-4/4 Rocephin 4/11-4/12 Meropenem 4/12-4/17   Time spent: 25 minutes    Erin Hearing ANP  [facility] 7 am - 330 pm/M-F for  direct patient care and secure chat Please refer to Amion for contact info 95  Days

## 2020-11-30 NOTE — Progress Notes (Signed)
Nutrition Follow-up  DOCUMENTATION CODES:  Not applicable  INTERVENTION:  Continue Tube feeding via PEG tube: -Osmolite 1.2 at 55 ml/h (1320 ml per day) -45 ml ProSource TF daily  Provides 1624 kcal, 84 gm protein, 1070 ml free water daily  -100 ml free water every 4 hours    NUTRITION DIAGNOSIS:  Inadequate oral intake related to inability to eat as evidenced by NPO status. -- ongoing  GOAL:  Patient will meet greater than or equal to 90% of their needs -- Met with TF  MONITOR:  TF tolerance,Vent status  REASON FOR ASSESSMENT:  Consult,Ventilator Enteral/tube feeding initiation and management  ASSESSMENT:  Pt with PMH of HTN with medication noncompliance admitted with hypertensive thalamic ICH.  2/10 s/p EVD placement and intubation for respiratory arrest 2/13 EVD removed 2/17 palliative care consult; family requesting tx to [facility]  2/24 s/p trach and PEG  3/1 tx to [facility] 3/11 trach changed to [place] #6 cuffless 4/08 pt w/ sepsis 2/2 UTI 4/18 28% ATC/5L 5/2 CIR reevaluated pt and determined she has not made enough improvement for admit; recommended SNF 5/8 trach changed to shiley #4 cuffless   Pt remains non-verbal. Note pt has been following a few commands with therapy and has small independent movements including tracking at times.  Pt continues to tolerate TF via PEG. Current regimen: Osmolite 1.2 at 55 ml/h w/ 45 ml ProSource TF daily, 130m free water flushes Q4H. Tolerating well per RN.   UOP: [id number] hours   Medications: SSI Q4H, protonix, miralax, senokot Labs reviewed. CBGs 133-140  Diet Order:   Diet Order            Diet NPO time specified  Diet effective now                EDUCATION NEEDS:  No education needs have been identified at this time  Skin:  Skin Assessment: Reviewed RN Assessment  Last BM:  5/7  Height:  Ht Readings from Last 1 Encounters:  10/18/20 '5\' 5"'  (1.651 m)   Weight:  Wt Readings from Last 1 Encounters:   11/25/20 75.3 kg   Ideal Body Weight:  56.8 kg  BMI:  Body mass index is 27.62 kg/m.  Estimated Nutritional Needs:  Kcal:  1600-1800 Protein:  80-90 grams Fluid:  >1.6L/d    AmLarkin InaMS, RD, LDN RD pager number and weekend/on-call pager number located in [facility]

## 2020-12-01 LAB — GLUCOSE, CAPILLARY
Glucose-Capillary: 117 mg/dL — ABNORMAL HIGH (ref 70–99)
Glucose-Capillary: 139 mg/dL — ABNORMAL HIGH (ref 70–99)
Glucose-Capillary: 141 mg/dL — ABNORMAL HIGH (ref 70–99)
Glucose-Capillary: 143 mg/dL — ABNORMAL HIGH (ref 70–99)
Glucose-Capillary: 101 mg/dL — ABNORMAL HIGH (ref 70–99)
Glucose-Capillary: 121 mg/dL — ABNORMAL HIGH (ref 70–99)

## 2020-12-01 NOTE — Plan of Care (Signed)
  Problem: Education: Goal: Knowledge of disease or condition will improve Outcome: Progressing Goal: Knowledge of patient specific risk factors addressed and post discharge goals established will improve Outcome: Progressing   Problem: Coping: Goal: Will identify appropriate support needs Outcome: Progressing   Problem: Health Behavior/Discharge Planning: Goal: Ability to manage health-related needs will improve Outcome: Progressing   Problem: Self-Care: Goal: Verbalization of feelings and concerns over difficulty with self-care will improve Outcome: Progressing Goal: Ability to communicate needs accurately will improve Outcome: Progressing   Problem: Nutrition: Goal: Risk of aspiration will decrease Outcome: Progressing   Problem: Intracerebral Hemorrhage Tissue Perfusion: Goal: Complications of Intracerebral Hemorrhage will be minimized Outcome: Progressing   Problem: Education: Goal: Knowledge of General Education information will improve Description: Including pain rating scale, medication(s)/side effects and non-pharmacologic comfort measures Outcome: Progressing   Problem: Health Behavior/Discharge Planning: Goal: Ability to manage health-related needs will improve Outcome: Progressing   Problem: Clinical Measurements: Goal: Ability to maintain clinical measurements within normal limits will improve Outcome: Progressing Goal: Will remain free from infection Outcome: Progressing Goal: Diagnostic test results will improve Outcome: Progressing Goal: Respiratory complications will improve Outcome: Progressing Goal: Cardiovascular complication will be avoided Outcome: Progressing   Problem: Coping: Goal: Level of anxiety will decrease Outcome: Progressing   Problem: Elimination: Goal: Will not experience complications related to bowel motility Outcome: Progressing Goal: Will not experience complications related to urinary retention Outcome: Progressing    Problem: Pain Managment: Goal: General experience of comfort will improve Outcome: Progressing   Problem: Skin Integrity: Goal: Risk for impaired skin integrity will decrease Outcome: Progressing   Problem: Intracerebral Hemorrhage Tissue Perfusion: Goal: Complications of Intracerebral Hemorrhage will be minimized Outcome: Progressing   Problem: Ischemic Stroke/TIA Tissue Perfusion: Goal: Complications of ischemic stroke/TIA will be minimized Outcome: Progressing

## 2020-12-01 NOTE — Progress Notes (Signed)
Physical Therapy Treatment Patient Details Name: Charlene [name] MRN: [id number] DOB: 02/15/1966 Today's Date: 12/01/2020    History of Present Illness Pt is 55 y/o female presents to [facility] on 08/26/20 with L sided weakness and headaches. 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 s/p Right frontal ventricular catheter placement on 2/10, stopped functioning on 2/11.  MRI 2/11 shows right greater than left mesial temporal lobes / parahippocampal cortex, bilateral basal ganglia, and possibly the upper pons that are concerning for acute infarcts, most likely secondary to mass effect from the hemorrhage. ETT 2/10-2/24.  Pt with trach and PEG on 09/10/20. Hospital course complicated by persistent fever, Serratia pneumonia and now pre-orbital cellulitis.   PMH includes anxiety, HTN.    PT Comments    Focus of session was ROM activity and transition bed>chair with Maximove lift. Tilt bed ordered yesterday and delivered for session - unfortunately it was the wrong bed, and the correct bed was not delivered prior to end of session.  Anticipate we will be able to initiate tilts tomorrow if time allows and bed does in fact arrive as planned.    Follow Up Recommendations  SNF     Equipment Recommendations  Hospital bed;Wheelchair cushion (measurements PT);Wheelchair (measurements PT)    Recommendations for Other Services Rehab consult     Precautions / Restrictions Precautions Precautions: Fall Precaution Comments: peg and trach Required Braces or Orthoses: Other Brace Other Brace: RLE soft knee brace and LUE soft elbow brace (wearing schedule in RN orders); bil PRAFOs Restrictions Weight Bearing Restrictions: No    Mobility  Bed Mobility Overal bed mobility: Needs Assistance Bed Mobility: Rolling;Sidelying to Sit;Sit to Supine Rolling: Total assist          General bed mobility comments: Total assist required for rolling to get Maximove sling in place.    Transfers                 General transfer comment: Not tested this session  Ambulation/Gait             General Gait Details: non ambulatory   Stairs             Wheelchair Mobility    Modified Rankin (Stroke Patients Only) Modified Rankin (Stroke Patients Only) Pre-Morbid Rankin Score: No symptoms Modified Rankin: Severe disability     Balance                                            Cognition Arousal/Alertness: Awake/alert Behavior During Therapy: Flat affect Overall Cognitive Status: Impaired/Different from baseline Area of Impairment: Following commands;Awareness;Attention                 Orientation Level: Place;Time;Situation Current Attention Level: Focused Memory: Decreased short-term memory Following Commands: Follows one step commands inconsistently;Follows one step commands with increased time Safety/Judgement: Decreased awareness of safety;Decreased awareness of deficits Awareness: Intellectual Problem Solving: Slow processing;Decreased initiation;Difficulty sequencing;Requires verbal cues;Requires tactile cues General Comments: Pt tracking therapist to end range of eyes to the left and held gaze for ~30 seconds. Following some commands - see OT note for details.      Exercises Other Exercises Other Exercises: B knee extension stretch, B hip flexor stretch (modified), and B gastroc stretch    General  Comments        Pertinent Vitals/Pain Pain Assessment: Faces Faces Pain Scale: Hurts a little bit Pain Location: General grimacing with movement/stretching Pain Descriptors / Indicators: Grimacing;Discomfort Pain Intervention(s): Limited activity within patient's tolerance;Monitored during session;Repositioned    Home Living                      Prior Function            PT Goals  (current goals can now be found in the care plan section) Acute Rehab PT Goals Patient Stated Goal: Pt unable to state PT Goal Formulation: Patient unable to participate in goal setting Time For Goal Achievement: 12/14/20 Potential to Achieve Goals: Poor Progress towards PT goals: Progressing toward goals    Frequency    Min 2X/week      PT Plan Current plan remains appropriate    Co-evaluation PT/OT/SLP Co-Evaluation/Treatment: Yes Reason for Co-Treatment: Complexity of the patient's impairments (multi-system involvement);Necessary to address cognition/behavior during functional activity;For patient/therapist safety;To address functional/ADL transfers PT goals addressed during session: Mobility/safety with mobility;Balance;Strengthening/ROM        AM-PAC PT "6 Clicks" Mobility   Outcome Measure  Help needed turning from your back to your side while in a flat bed without using bedrails?: Total Help needed moving from lying on your back to sitting on the side of a flat bed without using bedrails?: Total Help needed moving to and from a bed to a chair (including a wheelchair)?: Total Help needed standing up from a chair using your arms (e.g., wheelchair or bedside chair)?: Total Help needed to walk in hospital room?: Total Help needed climbing 3-5 steps with a railing? : Total 6 Click Score: 6    End of Session   Activity Tolerance: Patient tolerated treatment well Patient left: in bed;with call bell/phone within reach;with family/visitor present Nurse Communication: Mobility status;Need for lift equipment PT Visit Diagnosis: Hemiplegia and hemiparesis;Other symptoms and signs involving the nervous system (R29.898);Other abnormalities of gait and mobility (R26.89) Hemiplegia - Right/Left: Right Hemiplegia - dominant/non-dominant: Dominant Hemiplegia - caused by: Cerebral infarction;Nontraumatic intracerebral hemorrhage;Other Nontraumatic intracranial hemorrhage     Time:  1610-9604 PT Time Calculation (min) (ACUTE ONLY): 63 min  Charges:  $Therapeutic Exercise: 8-22 mins $Therapeutic Activity: 8-22 mins                     Conni Slipper, PT, DPT Acute Rehabilitation Services Pager: [phone] Office: [phone]    Marylynn Pearson 12/01/2020, 3:01 PM

## 2020-12-01 NOTE — Progress Notes (Signed)
[facility] PROGRESS NOTE  Charlene [name] VOJ:[id number] DOB: [birth year] DOA: 08/26/2020 PCP: Patient, [name] (Inactive)    October 08, 2020     10/16/2020                        10/16/2020     10/18/2020                        10/20/2020    Status: Remains inpatient appropriate because:Altered mental status, Unsafe d/c plan, IV treatments appropriate due to intensity of illness or inability to take PO and Inpatient level of care appropriate due to severity of illness   Dispo: The patient is from: Home              Anticipated d/c is to: SNF trach capable-[place]              Patient currently is medically stable to d/c.   Difficult to place patient Yes   Level of care: Telemetry Medical  Code Status: Full Family Communication: Checked information: Daughter Charlene [name] [phone]); husband Charlene [name] [phone]) is her POA and legal contact.  5/9 other at bedside.  Also case management updated husband Charlene [name] and was requesting answer regarding discharge disposition.  5/11 spoke with daughter since I had been informed after hours on 5/10 that she was at the bedside and wished to speak to a provider and specifically requested me.  When I called her back on 5/11 he stated she had not asked to speak to me.  Stated I was recontacting her since I knew she was responsible for following up on the [place] applications.  She states that she is no longer involved in this process and request that we contact patient's husband Charlene [name] in the future. DVT prophylaxis: Chronically bedbound.  5/6: We will discontinue prophylactic Lovenox   Vaccination status: Unvaccinated-have discussed both with patient's mother and with husband Charlene [name].  He wishes to speak to the rest of the family before making a decision but is leaning towards [name] vaccine.  Micro Data:  2/9 COVID + Influenza negative 2/10 MRSA PCR negative 2/12 BCx 2 > no growth  2/19  BC 1/2 +Stap EPI, 1/2 no growth  10/07/2020 sputum positive for Serratia marcescens 4/8 Enterobacter cloaca + urine culture MDR sensitive only to imipenem and gentamicin   Antimicrobials:  Cefepime 2/12 > 2/15 Vanc 2/12 > 2/15 10/08/2020 ciprofloxacin>>3/30 Unasyn 3/30 >> 4/02 Rocephin 4/11 > 4/12 Meropenem 4/12>4/17   HPI: 55 yo F with 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  Subsequently on 2/10, she developed obtundation and respiratory arrest leading to intubation.  She has remained persistently obtunded, now has a trach and a PEG.  Hospital course complicated by persistent fever, Serratia pneumonia and now pre-orbital cellulitis  As of 3/25 with adjustments in hypertonicity medications patient began to improve regarding hypertonicity.  Eventually started on provigil with improvements in alertness.  Evaluated by rehab physician who recommended increasing provigil dose further.  Subsequently patient has become more alert.  Most days she is able to track and with encouragement able to follow simple commands especially since partial obstruction glasses placed.  See progress note dictated 4/27 for expanded details.   2/09 Admit for [place] 2/10 EVD placed, later intubated due to obtundation and respiratory arrest 2/11 MRI Brain, hypertonic saline d/c'd, EVD not  working.  Husband did not consent to PICC  2/13 EVD removed 2/17 [place] discussion with family, PMT 2/24 Bedside trach and PEG 3/1 Transferred to [facility]  Subjective: Did not awaken.  No family at bedside.  Objective: Vitals:   12/01/20 0500 12/01/20 0501  BP:  (!) 158/89  Pulse: 94   Resp: (!) 24   Temp:    SpO2: 100%     Intake/Output Summary (Last 24 hours) at 12/01/2020 0749 Last data filed at 12/01/2020 0400 Gross per 24 hour  Intake 1320 ml  Output 400 ml  Net 920 ml   Filed Weights   11/16/20 0500 11/22/20 0500 11/25/20 0346  Weight:  72.9 kg 76 kg 75.3 kg    Exam:  Constitutional: Resting comfortably and appears to be in no acute distress. Respiratory: Downsized to #4.0 cuffless trach, trach collar with FiO2 21% at 5L,O2 sats 97 to 99%.  Anterior lung sounds clear.  No increased work of breathing while sleeping.  No tracheal secretions.  Able to manage oral secretions without drooling. Cardiovascular: Normal heart sounds, regular pulse, no peripheral edema although occasionally does have right pedal edema from hanging foot off side of bed. Abdomen:  PEG tube tube feedings infusing-LBM 5/16, normoactive bowel sounds.  Tube feeding infusing. Musculoskeletal: Bilateral atrophy of thigh and calf muscles Neurologic: CN 2-12 intact except notable with left ocular disconjugate gaze with medial right downward gaze. Inconsistently tracks with right eye.  Psych: Nonverbal   Assessment/Plan: Acute problems: Multiple acute strokes  Right Thalamus ICH with IVH  Obstructive Hydrocephalus/Acute on persistent metabolic Encephalopathy/brain injury related hypertonicity -Continue baclofen and Provigil -Laxative induced diarrhea resolved-have resumed lower dose -Continue PT/OT/SLP.  Will need to continue these therapies once discharged to SNF; SLP services will primarily be focused on Passy-Muir valve training -5/2, CIR MD reevaluated patient " Charlene [name] unfortunately has not made any meaningful functional gains over the last month. She remains most appropriate for SNF level care".   Neck Contracture/torticollis -- continue PT/OT and other supportive care  Hypertensive emergency/Malignant HTN -Due to financial constraints patient was not taking carvedilol and hydrochlorothiazide prior to admission -Continue Norvasc, Lopressor and hydralazine.   Acute hypoxemic respiratory failure secondary to CVA requiring mechanical ventilation (Resolved) Trach dependent -Downsized to #4 cuffless trach on 5/8 -Per trach team: Has significant  altered mentation and inability to manage oral secretions at present time is not a safe candidate for decannulation.  Discussed this with family as major obstacle in discharging to home.  This will also decrease options for SNF placement. -5/9: Reevaluated by trach team.  Again they reiterate patient is not a good candidate for decannulation "unless the family is prepared to transition to comfort should she deteriorate.".  PCCM will continue to follow weekly  Dysphagia -Continue tube feedings, Prosource liquid, free water -Follow CBGs and provide SSI      Other problems: Right upper extremity thrombophlebitis - [name] -> age indeterminate SVT of right cephalic vein  ESBL positive Enterobacter UTI -Has completed 5 days of meropenem -Contact isolation during hospitalization -continue Oxybutynin suspected bladder spasms  Bacterial Conjunctivitis /periorbital cellulitis -Resolved  Chronic pansinusitis/recurrent fevers 2/2 aspiration PNA;History of Serratia Pneumonia  Hospital Acquired Pneumonia:  -Completed several courses of antibiotic -Follow-up imaging including sinus CT consistent with chronic but stable and resolving sinusitis   Anemia -Stable  HLD -Continue Lipitor      Data Reviewed: Basic Metabolic Panel: Recent Labs  Lab 11/26/20 0906  NA 139  K 3.7  CL 100  CO2 30  GLUCOSE 134*  BUN 14  CREATININE 0.51  CALCIUM 9.4   Liver Function Tests: No results for input(s): AST, ALT, ALKPHOS, BILITOT, PROT, ALBUMIN in the last 168 hours. CBC: Recent Labs  Lab 11/26/20 0906  WBC 5.8  HGB 10.8*  HCT 34.1*  MCV 80.2  PLT 337    CBG: Recent Labs  Lab 11/30/20 1135 11/30/20 1502 11/30/20 2048 12/01/20 0058 12/01/20 0434  GLUCAP 141* 118* 141* 117* 139*     Studies: No results found.  Scheduled Meds: . amLODipine  10 mg Per Tube Daily  . atorvastatin  40 mg Per Tube Daily  . baclofen  7.5 mg Per Tube 2 times per day  . chlorhexidine gluconate  (MEDLINE KIT)  15 mL Mouth Rinse BID  . Chlorhexidine Gluconate Cloth  6 each Topical Daily  . feeding supplement (PROSource TF)  45 mL Per Tube Daily  . free water  100 mL Per Tube Q4H  . hydrALAZINE  50 mg Per Tube Q8H  . insulin aspart  0-9 Units Subcutaneous Q4H  . mouth rinse  15 mL Mouth Rinse 5 X Daily  . metoprolol tartrate  100 mg Per Tube BID  . modafinil  200 mg Per Tube Daily  . oxybutynin  5 mg Per Tube BID  . pantoprazole sodium  40 mg Per Tube BID  . polyethylene glycol  17 g Per Tube QODAY  . tiZANidine  2 mg Per Tube QHS   Continuous Infusions: . sodium chloride Stopped (10/26/20 1445)  . 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   Palliative care.    PCCM continues to follow.  No decannulation.   Procedures:  Tracheostomy  PEG tube  EEG  Echocardiogram  Antibiotics: Vancomycin 2/12-2/15 Maxipime 2/12-2/15 Ancef 2/19-2/27 Vancomycin 2/20 x 1 dose Maxipime 2/27- 3/5 Vancomycin 3/1-3/3 Ancef 3/6-3/14 Cipro 3/20 4-3/30 Unasyn 3/30-4/4 Rocephin 4/11-4/12 Meropenem 4/12-4/17   Time spent: 25 minutes    Erin Hearing ANP  [facility] 7 am - 330 pm/M-F for direct patient care and secure chat Please refer to [place] for contact info 96  Days

## 2020-12-01 NOTE — Progress Notes (Signed)
Occupational Therapy Treatment Patient Details Name: Charlene [name] MRN: [id number] DOB: 1966-03-25 Today's Date: 12/01/2020    History of present illness Pt is 55 y/o female presents to [facility] on 08/26/20 with L sided weakness and headaches. 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 s/p Right frontal ventricular catheter placement on 2/10, stopped functioning on 2/11.  MRI 2/11 shows right greater than left mesial temporal lobes / parahippocampal cortex, bilateral basal ganglia, and possibly the upper pons that are concerning for acute infarcts, most likely secondary to mass effect from the hemorrhage. ETT 2/10-2/24.  Pt with trach and PEG on 09/10/20. Hospital course complicated by persistent fever, Serratia pneumonia and now pre-orbital cellulitis.   PMH includes anxiety, HTN.   OT comments  Patient seen with PT, planned transition to tilt bed but wrong bed delivered and correct bed not delivered by end of session. Supine engaged in tracking and reaching tasks, pt following simple commands today, tracking therapist with increased time and multimodal cueing.  Vivan was able to squeeze her R hand, appears to purposefully reach for spoon and lotion during session with R UE poor motor planning patterns noted.  Fatigues easily. PROM to  LUE provided. Maximove to recliner at end of session. Will follow acutely.     Follow Up Recommendations  SNF;Supervision/Assistance - 24 hour    Equipment Recommendations  None recommended by OT    Recommendations for Other Services      Precautions / Restrictions Precautions Precautions: Fall Precaution Comments: peg and trach Required Braces or Orthoses: Other Brace Other Brace: RLE soft knee brace and LUE soft elbow brace (wearing schedule in RN orders); bil PRAFOs Restrictions Weight Bearing Restrictions: No        Mobility Bed Mobility Overal bed mobility: Needs Assistance Bed Mobility: Rolling;Sidelying to Sit;Sit to Supine Rolling: Total assist         General bed mobility comments: Total assist required for rolling to get Maximove sling in place.    Transfers                 General transfer comment: Not tested this session    Balance                                           ADL either performed or assessed with clinical judgement   ADL     Eating/Feeding Details (indicate cue type and reason): SLP present.  Placed spoon in R hand, max faciliation to bring spoon to mouth- limited initation with movement using spoon.   Grooming Details (indicate cue type and reason): delayed >5 minutes of rubbing lotion into L arm using R UE.                                     Vision   Additional Comments: visually tracking towards L today   Perception     Praxis      Cognition Arousal/Alertness: Awake/alert Behavior During Therapy: Flat affect Overall Cognitive Status: Impaired/Different from baseline Area of Impairment: Following commands;Awareness;Attention;Problem solving                 Orientation Level: Place;Time;Situation Current Attention Level: Focused Memory: Decreased short-term  memory Following Commands: Follows one step commands inconsistently;Follows one step commands with increased time Safety/Judgement: Decreased awareness of safety;Decreased awareness of deficits Awareness: Intellectual Problem Solving: Slow processing;Decreased initiation;Difficulty sequencing;Requires verbal cues;Requires tactile cues General Comments: pt following simple commands with increased time and mulitmodal cueing, scanning towards L side to track therapist, squeezing with R hand, appears to purposefully grab spoon/lotion bottle with R hand but difficulty sustaining grasp. WIth SLP present, positioning UE in flexion--patient able to reach  towards and touch pudding flavor she would like (choc vs vanilla) given increased time        Exercises Exercises: Other exercises Other Exercises Other Exercises: B knee extension stretch, B hip flexor stretch (modified), and B gastroc stretch Other Exercises: PROM of L UE   Shoulder Instructions       General Comments      Pertinent Vitals/ Pain       Pain Assessment: Faces Faces Pain Scale: Hurts a little bit Pain Location: General grimacing with movement/stretching Pain Descriptors / Indicators: Grimacing;Discomfort Pain Intervention(s): Monitored during session;Limited activity within patient's tolerance;Repositioned  Home Living                                          Prior Functioning/Environment              Frequency  Min 2X/week        Progress Toward Goals  OT Goals(current goals can now be found in the care plan section)  Progress towards OT goals: Progressing toward goals (slowly)  Acute Rehab OT Goals Patient Stated Goal: Pt unable to state OT Goal Formulation: With family  Plan Discharge plan remains appropriate;Frequency remains appropriate    Co-evaluation    PT/OT/SLP Co-Evaluation/Treatment: Yes Reason for Co-Treatment: Complexity of the patient's impairments (multi-system involvement);For patient/therapist safety;To address functional/ADL transfers PT goals addressed during session: Mobility/safety with mobility;Balance;Strengthening/ROM OT goals addressed during session: ADL's and self-care;Strengthening/ROM      AM-PAC OT "6 Clicks" Daily Activity     Outcome Measure   Help from another person eating meals?: Total Help from another person taking care of personal grooming?: Total Help from another person toileting, which includes using toliet, bedpan, or urinal?: Total Help from another person bathing (including washing, rinsing, drying)?: Total Help from another person to put on and taking off regular upper body  clothing?: Total Help from another person to put on and taking off regular lower body clothing?: Total 6 Click Score: 6    End of Session Equipment Utilized During Treatment: Oxygen  OT Visit Diagnosis: Muscle weakness (generalized) (M62.81);Apraxia (R48.2);Cognitive communication deficit (R41.841);Hemiplegia and hemiparesis Symptoms and signs involving cognitive functions: Other Nontraumatic ICH Hemiplegia - Right/Left: Right Hemiplegia - caused by: Other Nontraumatic intracranial hemorrhage   Activity Tolerance Patient tolerated treatment well   Patient Left with call bell/phone within reach;in chair;with chair alarm set   Nurse Communication Mobility status        Time: 2637-8588 OT Time Calculation (min): 64 min  Charges: OT General Charges $OT Visit: 1 Visit OT Treatments $Self Care/Home Management : 8-22 mins $Therapeutic Activity: 8-22 mins  Barry Brunner, OT Acute Rehabilitation Services Pager [phone] Office [phone]    [name] 12/01/2020, 3:46 PM

## 2020-12-01 NOTE — Progress Notes (Signed)
  Speech Language Pathology Treatment: Dysphagia;Cognitive-Linquistic  Patient Details Name: Charlene [name] MRN: [id number] DOB: [birth year] Today's Date: 12/01/2020 Time: 1415-1430 SLP Time Calculation (min) (ACUTE ONLY): 15 min  Assessment / Plan / Recommendation Clinical Impression  Goal is establishment of communication system to allow Charlene [name] a level control over basic needs with y/n gestures via head or arm/hand. With support from elbow and wrist she moving fingers to right indicating preference of chocolate pudding over the vanilla. Unable to use right thumb for communication. Initially Charlene [name] making moaning vocalization then demonstrated minimal labial movement when SLP camp closer.Prior to SLP arrival, PT reported hearing Charlene [name] possibly phonate an approximation of "enough" when attempting to placing her in Maxi move lift. She also was able to rub lotion on arm with OT.  Today she did not open mouth wide to accept pudding but did allow small amount anteriorly.Exhibited more of a suckling motion than manipulation with pudding. Spontaneous swallows noted prior to pudding and 1-2 swallows of minimal amount. Continue oral care and po trials with ST only.    HPI HPI: Pt is 55 y/o female presents to [facility] on 2/9 with L sided weakness and headaches. PMH includes anxiety, HTN. 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 s/p Right frontal ventricular catheter placement on 2/10, stopped functioning on 2/11.  MRI 2/11 shows right greater than left mesial temporal lobes / parahippocampal cortex, bilateral basal ganglia, and possibly the upper pons that are concerning for acute infarcts, most likely secondary to mass effect from the hemorrhage. ETT 2/10; trach/PEG 09/10/20.  Trach changed to #6 cuffless 3/11. Hospital course complicated by persistent fever,  Serratia  pneumonia and pre-orbital cellulitis.      SLP Plan  Continue with current plan of care       Recommendations  Diet recommendations: NPO Medication Administration: Via alternative means      Patient may use Passy-Muir Speech Valve: Intermittently with supervision;Caregiver trained to provide supervision;During all therapies with supervision PMSV Supervision: Full         Oral Care Recommendations: Oral care QID Follow up Recommendations: Skilled Nursing facility SLP Visit Diagnosis: Aphonia (R49.1);Cognitive communication deficit (R41.841);Dysphagia, oropharyngeal phase (R13.12) Plan: Continue with current plan of care       GO                Charlene [name] 12/01/2020, 4:09 PM  Charlene [name] Charlene [name].Ed Nurse, children's [phone] Office [phone]

## 2020-12-02 LAB — GLUCOSE, CAPILLARY
Glucose-Capillary: 103 mg/dL — ABNORMAL HIGH (ref 70–99)
Glucose-Capillary: 106 mg/dL — ABNORMAL HIGH (ref 70–99)
Glucose-Capillary: 124 mg/dL — ABNORMAL HIGH (ref 70–99)
Glucose-Capillary: 125 mg/dL — ABNORMAL HIGH (ref 70–99)
Glucose-Capillary: 135 mg/dL — ABNORMAL HIGH (ref 70–99)
Glucose-Capillary: 136 mg/dL — ABNORMAL HIGH (ref 70–99)
Glucose-Capillary: 144 mg/dL — ABNORMAL HIGH (ref 70–99)

## 2020-12-02 NOTE — Plan of Care (Signed)
  Problem: Education: Goal: Knowledge of disease or condition will improve Outcome: Progressing Goal: Knowledge of patient specific risk factors addressed and post discharge goals established will improve Outcome: Progressing   Problem: Coping: Goal: Will identify appropriate support needs Outcome: Progressing   Problem: Health Behavior/Discharge Planning: Goal: Ability to manage health-related needs will improve Outcome: Progressing   Problem: Self-Care: Goal: Verbalization of feelings and concerns over difficulty with self-care will improve Outcome: Progressing Goal: Ability to communicate needs accurately will improve Outcome: Progressing   Problem: Nutrition: Goal: Risk of aspiration will decrease Outcome: Progressing   Problem: Intracerebral Hemorrhage Tissue Perfusion: Goal: Complications of Intracerebral Hemorrhage will be minimized Outcome: Progressing   Problem: Education: Goal: Knowledge of General Education information will improve Description: Including pain rating scale, medication(s)/side effects and non-pharmacologic comfort measures Outcome: Progressing   Problem: Health Behavior/Discharge Planning: Goal: Ability to manage health-related needs will improve Outcome: Progressing   Problem: Clinical Measurements: Goal: Ability to maintain clinical measurements within normal limits will improve Outcome: Progressing Goal: Will remain free from infection Outcome: Progressing Goal: Diagnostic test results will improve Outcome: Progressing Goal: Respiratory complications will improve Outcome: Progressing Goal: Cardiovascular complication will be avoided Outcome: Progressing   Problem: Coping: Goal: Level of anxiety will decrease Outcome: Progressing   Problem: Elimination: Goal: Will not experience complications related to bowel motility Outcome: Progressing Goal: Will not experience complications related to urinary retention Outcome: Progressing    Problem: Pain Managment: Goal: General experience of comfort will improve Outcome: Progressing   Problem: Skin Integrity: Goal: Risk for impaired skin integrity will decrease Outcome: Progressing   Problem: Intracerebral Hemorrhage Tissue Perfusion: Goal: Complications of Intracerebral Hemorrhage will be minimized Outcome: Progressing   Problem: Ischemic Stroke/TIA Tissue Perfusion: Goal: Complications of ischemic stroke/TIA will be minimized Outcome: Progressing   

## 2020-12-02 NOTE — Progress Notes (Signed)
[facility] PROGRESS NOTE  Charlene [name]:[id number] DOB: 1965/08/25 DOA: 08/26/2020 PCP: Patient, [name] (Inactive)    October 08, 2020     10/16/2020                        10/16/2020     10/18/2020                        10/20/2020    Status: Remains inpatient appropriate because:Altered mental status, Unsafe d/c plan, IV treatments appropriate due to intensity of illness or inability to take PO and Inpatient level of care appropriate due to severity of illness   Dispo: The patient is from: Home              Anticipated d/c is to: SNF trach capable-[place]              Patient currently is medically stable to d/c.   Difficult to place patient Yes   Level of care: Telemetry Medical  Code Status: Full Family Communication: husband Charlene [name] [phone]) is her POA and legal contact.  Charlene [name] was at bedside on 5/18 and given update DVT prophylaxis: Chronically bedbound.  5/6: We will discontinue prophylactic Lovenox   Vaccination status: Unvaccinated-have discussed both with patient's mother and with husband Charlene [name].  He wishes to speak to the rest of the family before making a decision but is leaning towards Charlene [name] vaccine.  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  Subsequently on 2/10, she developed obtundation and respiratory arrest leading to intubation.  She has remained persistently obtunded, now has  a trach and a PEG.  Hospital course complicated by persistent fever, Serratia pneumonia and now pre-orbital cellulitis  As of 54/25 with adjustments in hypertonicity medications patient began to improve regarding hypertonicity.  Eventually started on provigil with improvements in alertness.  Evaluated by rehab physician who recommended increasing provigil dose further.  Subsequently patient has become more alert.  Most days she is able to track and with encouragement able to follow simple commands especially since partial obstruction glasses placed.  See progress note dictated 4/27 for expanded details.   2/09 Admit for [place] 2/10 EVD placed, later intubated due to obtundation and respiratory arrest 2/11 MRI Brain, hypertonic saline d/c'd, EVD not working.  Husband did not consent to PICC  2/13 EVD removed 2/17 [place] discussion with family, PMT 2/24 Bedside trach and PEG 3/1 Transferred to [facility]  Subjective: Awake.  Does track this Probation officer with right eye when spoken to.  She also was able to track my finger up on command with right eye.  Not make any effort to phonate or follow simple commands  Objective: Vitals:   12/01/20 2326 12/02/20 0748  BP: 111/67 (!) 146/96  Pulse: 74 81  Resp: 14   Temp: 98 F (36.7 C)   SpO2: 98% 97%   No intake or  output data in the 24 hours ending 12/02/20 0815 Filed Weights   11/16/20 0500 11/22/20 0500 11/25/20 0346  Weight: 72.9 kg 76 kg 75.3 kg    Exam:  Constitutional: Awake, no acute distress and appears to be comfortable. Respiratory: #4.0 cuffless trach, trach collar with FiO2 21% at 5L, secretions.  Able to manage oral secretions without drooling. Cardiovascular: Normal heart sounds, no peripheral edema, regular pulse Abdomen:  PEG tube tube feedings infusing-LBM 5/16, abdomen soft and nontender with normoactive bowel Musculoskeletal: Bilateral atrophy of thigh and calf muscles Neurologic: At baseline has persistent left ocular downward gaze but  today patient spontaneously was able to rotate left eye up towards the midline and look towards the left.  She was tracking today on command with right eye. Psych: Nonverbal and no attempts to phonate today  Assessment/Plan: Acute problems: Multiple acute strokes  Right Thalamus ICH with IVH  Obstructive Hydrocephalus/Acute on persistent metabolic Encephalopathy/brain injury related hypertonicity -Continue baclofen and Provigil -Laxative induced diarrhea resolved-have resumed lower dose -Continue PT/OT/SLP.  Will need to continue these therapies once discharged to SNF; SLP services will primarily be focused on Passy-Muir valve training -5/2, CIR MD reevaluated patient " [name] unfortunately has not made any meaningful functional gains over the last month. She remains most appropriate for SNF level care".   Neck Contracture/torticollis -- continue PT/OT and other supportive care  Hypertensive emergency/Malignant HTN -Due to financial constraints patient was not taking carvedilol and hydrochlorothiazide prior to admission -Continue Norvasc, Lopressor and hydralazine.   Acute hypoxemic respiratory failure secondary to CVA requiring mechanical ventilation (Resolved) Trach dependent -Downsized to #4 cuffless trach on 5/8 -Per trach team: Has significant altered mentation and inability to manage oral secretions at present time is not a safe candidate for decannulation.  Discussed this with family as major obstacle in discharging to home.  This will also decrease options for SNF placement. -5/9: Reevaluated by trach team.  Again they reiterate patient is not a good candidate for decannulation "unless the family is prepared to transition to comfort should she deteriorate.".  PCCM will continue to follow weekly  Dysphagia -Continue tube feedings, Prosource liquid, free water -Follow CBGs and provide SSI      Other problems: Right upper extremity thrombophlebitis - [name] -> age  indeterminate SVT of right cephalic vein  ESBL positive Enterobacter UTI -Has completed 5 days of meropenem -Contact isolation during hospitalization -continue Oxybutynin suspected bladder spasms  Bacterial Conjunctivitis /periorbital cellulitis -Resolved  Chronic pansinusitis/recurrent fevers 2/2 aspiration PNA;History of Serratia Pneumonia  Hospital Acquired Pneumonia:  -Completed several courses of antibiotic -Follow-up imaging including sinus CT consistent with chronic but stable and resolving sinusitis   Anemia -Stable  HLD -Continue Lipitor      Data Reviewed: Basic Metabolic Panel: Recent Labs  Lab 11/26/20 0906  NA 139  K 3.7  CL 100  CO2 30  GLUCOSE 134*  BUN 14  CREATININE 0.51  CALCIUM 9.4   Liver Function Tests: No results for input(s): AST, ALT, ALKPHOS, BILITOT, PROT, ALBUMIN in the last 168 hours. CBC: Recent Labs  Lab 11/26/20 0906  WBC 5.8  HGB 10.8*  HCT 34.1*  MCV 80.2  PLT 337    CBG: Recent Labs  Lab 12/01/20 1237 12/01/20 1553 12/01/20 1948 12/02/20 0058 12/02/20 0344  GLUCAP 143* 101* 121* 125* 106*     Studies: No results found.  Scheduled Meds: . amLODipine  10 mg Per Tube Daily  . atorvastatin  40 mg Per Tube Daily  .  baclofen  7.5 mg Per Tube 2 times per day  . chlorhexidine gluconate (MEDLINE KIT)  15 mL Mouth Rinse BID  . Chlorhexidine Gluconate Cloth  6 each Topical Daily  . feeding supplement (PROSource TF)  45 mL Per Tube Daily  . free water  100 mL Per Tube Q4H  . hydrALAZINE  50 mg Per Tube Q8H  . insulin aspart  0-9 Units Subcutaneous Q4H  . mouth rinse  15 mL Mouth Rinse 5 X Daily  . metoprolol tartrate  100 mg Per Tube BID  . modafinil  200 mg Per Tube Daily  . oxybutynin  5 mg Per Tube BID  . pantoprazole sodium  40 mg Per Tube BID  . polyethylene glycol  17 g Per Tube QODAY  . tiZANidine  2 mg Per Tube QHS   Continuous Infusions: . sodium chloride Stopped (10/26/20 1445)  . feeding supplement  (OSMOLITE 1.2 CAL) 1,000 mL (12/01/20 1553)    Principal Problem:   ICH (intracerebral hemorrhage) ([place]) Active Problems:   Acute hypoxemic respiratory failure (HCC)   Status post tracheostomy ([place])   Palliative care by specialist   Muscle hypertonicity   Oropharyngeal dysphagia   Poor prognosis   Aspiration pneumonia of right lower lobe (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   Palliative care.    PCCM continues to follow.  No decannulation.   Procedures:  Tracheostomy  PEG tube  EEG  Echocardiogram  Antibiotics: Vancomycin 2/12-2/15 Maxipime 2/12-2/15 Ancef 2/19-2/27 Vancomycin 2/20 x 1 dose Maxipime 2/27- 3/5 Vancomycin 3/1-3/3 Ancef 3/6-3/14 Cipro 3/20 4-3/30 Unasyn 3/30-4/4 Rocephin 4/11-4/12 Meropenem 4/12-4/17   Time spent: 25 minutes    Erin Hearing ANP  [facility] 7 am - 330 pm/M-F for direct patient care and secure chat Please refer to [place] for contact info 97  Days

## 2020-12-02 NOTE — TOC Progression Note (Signed)
Transition of Care [facility]) - Progression Note    Patient Details  Name: Charlene [name] MRN: [id number] Date of Birth: 1966/06/14  Transition of Care [facility]) CM/SW [place], RN Phone Number: 12/02/2020, 1:03 PM  Clinical Narrative:    Case management called and spoke with Jed Limerick, hospital attorney to follow up regarding communication with the patient's family, [name], regarding need for [facility] placement at [place], [place] care SNF facility.  I met with the patient's husband, [name], at the bedside and he continues to state that he does not want the patient placed at [facility] in [place], [place] due to the distance from [place], [place].  I explained to [name], husband that the hospital attorney, Jed Limerick should be reaching out to him in the next few days to communicate with him regarding patient's need for discharge from the hospital and placement at the nursing facility.  I asked that the patient's husband continue to reach out to case management regarding any care considerations or questions / information regarding transition to a SNF facility for care.  CM and MSW will continue to follow the patient for [facility] needs for discharge and transfer to SNF facility for care.   Expected Discharge Plan: Skilled Nursing Facility Barriers to Discharge: Continued Medical Work up  Expected Discharge Plan and Services Expected Discharge Plan: [place] In-house Referral: Clinical Social Work Discharge Planning Services: CM Consult Post Acute Care Choice: [place] arrangements for the past 2 months: Single Family Home                                       Social Determinants of Health (SDOH) Interventions    Readmission Risk Interventions Readmission Risk Prevention Plan 09/29/2020  Transportation Screening Complete  PCP or Specialist Appt within 3-5 Days Complete   HRI or [place] Complete  Social Work Consult for [place] Planning/Counseling Complete  Palliative Care Screening Complete  Medication Review Press photographer) Complete  Some recent data might be hidden

## 2020-12-02 NOTE — Progress Notes (Signed)
  Speech Language Pathology Treatment: Dysphagia;Cognitive-Linquistic;Passy Muir Speaking valve  Patient Details Name: Charlene [name] MRN: [id number] DOB: 07-25-1965 Today's Date: 12/02/2020 Time: 6433-2951 SLP Time Calculation (min) (ACUTE ONLY): 26 min  Assessment / Plan / Recommendation Clinical Impression  [name] exhibited increased interaction during integrative session with PT using tilt bed. She slowly progressed to mostly upright position with husband Charlene [name] on her right side. Using speaking valve, she spontaneously told husband "I love you" with moderate-significant dysarthria! Initiated labial movement/mouthing words on 3-4 occasions. Verbal commands followed with repetition included: initiation of head and labial movement to kiss her husband, reached for the spoon and moved toward mouth,. She tracked poster of family pictures to midline. She was attentive to husband and consistently looked further to the right when Charlene [name] was out of sight.  Opened oral cavity to receive minimal amount of pudding with hand over hand feeding. Efforts to manipulate consisted of labial smacking and brief period of holding. Swallow initiation delayed and weak from subjective measure. Educated husband that pt is having intermittent po trials with ST only at present.    HPI HPI: Pt is 55 y/o female presents to [facility] on 2/9 with L sided weakness and headaches. PMH includes anxiety, HTN. 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 s/p Right frontal ventricular catheter placement on 2/10, stopped functioning on 2/11.  MRI 2/11 shows right greater than left mesial temporal lobes / parahippocampal cortex, bilateral basal ganglia, and possibly the upper pons that are concerning for acute infarcts, most likely secondary to mass effect from the hemorrhage. ETT 2/10; trach/PEG 09/10/20.  Trach  changed to #6 cuffless 3/11. Hospital course complicated by persistent fever,  Serratia pneumonia and pre-orbital cellulitis.      SLP Plan  Continue with current plan of care       Recommendations  Diet recommendations: NPO Medication Administration: Via alternative means      Patient may use Passy-Muir Speech Valve: Intermittently with supervision;Caregiver trained to provide supervision;During all therapies with supervision PMSV Supervision: Full         Oral Care Recommendations: Oral care QID Follow up Recommendations: Skilled Nursing facility SLP Visit Diagnosis: Aphonia (R49.1);Cognitive communication deficit (O84.166) Plan: Continue with current plan of care                      Royce Macadamia 12/02/2020, 2:54 PM  Breck Coons Lonell Face.Ed Nurse, children's [phone] Office [phone]

## 2020-12-02 NOTE — Progress Notes (Signed)
Physical Therapy Treatment Patient Details Name: Charlene [name] MRN: [id number] DOB: [birth year] Today's Date: 12/02/2020    History of Present Illness Pt is 55 y/o female presents to [facility] on 08/26/20 with L sided weakness and headaches. 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 s/p Right frontal ventricular catheter placement on 2/10, stopped functioning on 2/11.  MRI 2/11 shows right greater than left mesial temporal lobes / parahippocampal cortex, bilateral basal ganglia, and possibly the upper pons that are concerning for acute infarcts, most likely secondary to mass effect from the hemorrhage. ETT 2/10-2/24.  Pt with trach and PEG on 09/10/20. Hospital course complicated by persistent fever, Serratia pneumonia and now pre-orbital cellulitis.   PMH includes anxiety, HTN.    PT Comments    Pt tolerated tilt bed well today and got up to 45 degrees during PT/SLP co-session.  Pt even starting to attempt to vocalize, localize to painful stretching stimuli appropriately.  VSS throughout tilt process (see details of BPs below).  We are hopeful to do more tilting and take trips outside in [facility] as able.  PT will continue to follow acutely for safe mobility progression.   Follow Up Recommendations  SNF     Equipment Recommendations  Hospital bed;Wheelchair cushion (measurements PT);Wheelchair (measurements PT);Other (comment) (tilt in space 18x18, hoyer lift)    Recommendations for Other Services       Precautions / Restrictions Precautions Precautions: Fall Precaution Comments: peg and trach Required Braces or Orthoses: Other Brace Other Brace: RLE soft knee brace and LUE soft elbow brace (wearing schedule in RN orders); bil PRAFOs    Mobility  Bed Mobility Overal bed mobility: Needs Assistance             General bed mobility comments: total assist  +2 for positioning. Angle: 46 degrees (highest angle, stopped in incriments of 10) Patient Response: Cooperative;Flat affect  Transfers                    Ambulation/Gait                 Stairs             Wheelchair Mobility    Modified Rankin (Stroke Patients Only)       Balance                                            Cognition Arousal/Alertness: Awake/alert Behavior During Therapy: Flat affect Overall Cognitive Status: Impaired/Different from baseline Area of Impairment: Attention;Following commands;Problem solving                   Current Attention Level: Focused (very fleeting sustained)   Following Commands: Follows one step commands with increased time;Follows one step commands inconsistently     Problem Solving: Slow processing;Decreased initiation;Requires verbal cues;Difficulty sequencing;Requires tactile cues General Comments: Pt was able to sustaint attention very fleetingly today to bring spoon to her mouth during SLP treatment.  Otherwise, focused, slow to process commands and at times you lose her attention before she can process the command and have to repeat.  She did show some very purposeful responses to stimuli today.  As Vernona Rieger was stretching her more painful R knee, she reached down with her right hand to  Laura's hand and started trying to peel Laura's hand off of her knee.      Exercises Other Exercises Other Exercises: bil LE knee extension stretches, lower trunk rotation to the left (with stabilizing hand on her right shoulder to prevent her upper trunk from coming with her lower trunk). Other Exercises: PROM L UE elbow extension Other Exercises: Left both soft braces on knees at end of session the knee one on the R knee and the elbow one on the left knee.    General Comments General comments (skin integrity, edema, etc.): 25 150/84 85bpm 100%  35 128/84 84bpm 99%; 117/91 88 bpm 99% after 5  minutes; 126/95 89bpm 98% after another 5 minutes  46 134/96 90 bpm 98%; 119/76 93 bpm 97%      Pertinent Vitals/Pain Pain Assessment: Faces Faces Pain Scale: Hurts even more Pain Location: with bil knee ROM R>L Pain Descriptors / Indicators: Grimacing;Guarding Pain Intervention(s): Limited activity within patient's tolerance;Monitored during session;Repositioned    Home Living                      Prior Function            PT Goals (current goals can now be found in the care plan section) Progress towards PT goals: Progressing toward goals    Frequency    Min 2X/week      PT Plan Current plan remains appropriate    Co-evaluation PT/OT/SLP Co-Evaluation/Treatment: Yes (with SLP) Reason for Co-Treatment: Complexity of the patient's impairments (multi-system involvement);Necessary to address cognition/behavior during functional activity PT goals addressed during session: Mobility/safety with mobility;Strengthening/ROM        AM-PAC PT "6 Clicks" Mobility   Outcome Measure  Help needed turning from your back to your side while in a flat bed without using bedrails?: Total Help needed moving from lying on your back to sitting on the side of a flat bed without using bedrails?: Total Help needed moving to and from a bed to a chair (including a wheelchair)?: Total Help needed standing up from a chair using your arms (e.g., wheelchair or bedside chair)?: Total Help needed to walk in hospital room?: Total Help needed climbing 3-5 steps with a railing? : Total 6 Click Score: 6    End of Session   Activity Tolerance: Patient tolerated treatment well Patient left: in bed;with call bell/phone within reach;with family/visitor present   PT Visit Diagnosis: Hemiplegia and hemiparesis;Other symptoms and signs involving the nervous system (R29.898);Other abnormalities of gait and mobility (R26.89) Hemiplegia - Right/Left: Right Hemiplegia - dominant/non-dominant:  Dominant Hemiplegia - caused by: Cerebral infarction;Nontraumatic intracerebral hemorrhage;Other Nontraumatic intracranial hemorrhage     Time: 1345-1500 PT Time Calculation (min) (ACUTE ONLY): 75 min  Charges:  $Therapeutic Exercise: 23-37 mins $Therapeutic Activity: 38-52 mins                     Corinna Capra, PT, DPT  Acute Rehabilitation [phone] pager [phone]) [phone] office

## 2020-12-03 LAB — GLUCOSE, CAPILLARY
Glucose-Capillary: 100 mg/dL — ABNORMAL HIGH (ref 70–99)
Glucose-Capillary: 120 mg/dL — ABNORMAL HIGH (ref 70–99)
Glucose-Capillary: 121 mg/dL — ABNORMAL HIGH (ref 70–99)
Glucose-Capillary: 123 mg/dL — ABNORMAL HIGH (ref 70–99)
Glucose-Capillary: 116 mg/dL — ABNORMAL HIGH (ref 70–99)

## 2020-12-03 NOTE — Progress Notes (Signed)
Per MD order, RT placed red cap on pt's trach. Pt tolerating well with SVS. Family at bedside and made aware of change. RN of pt also made aware. RT will continue to monitor pt.

## 2020-12-03 NOTE — Progress Notes (Signed)
CSW was notified by Lynnea Ferrier at [facility] who states the facility is unable to offer the patient a bed at this time.  Edwin Dada, MSW, LCSW Transitions of Care  Clinical Social Worker II [phone]

## 2020-12-03 NOTE — TOC Progression Note (Signed)
Transition of Care [facility]) - Progression Note    Patient Details  Name: RAI SINAGRA MRN: [id number] Date of Birth: 06-06-1966  Transition of Care [facility]) CM/SW [place], RN Phone Number: 12/03/2020, 12:54 PM  Clinical Narrative:    Case management met with the patient and grandmother, [name], at the bedside for transitions of care.  The patient's family continues to refuse SNF placement at [facility] at this time due to the distance and inability to visit the patient as readily while in the [place] area.  CM spoke with Erin Hearing, NP and discussed that patient was noted to track with Right eye, move right arm more readily and has been making verbal noises through trach / trach collar at this time.  Lissa Merlin, NP is going to follow up with ST and respiratory therapy to assess patient's airway for possible decannulation.  CM and MSW with DTP Team will continue to follow for discharge needs and communication with family for transitions of care to SNF facility for rehabilitation.  Expected Discharge Plan: Skilled Nursing Facility Barriers to Discharge: Continued Medical Work up  Expected Discharge Plan and Services Expected Discharge Plan: [place] In-house Referral: Clinical Social Work Discharge Planning Services: CM Consult Post Acute Care Choice: [place] arrangements for the past 2 months: Single Family Home                                       Social Determinants of Health (SDOH) Interventions    Readmission Risk Interventions Readmission Risk Prevention Plan 09/29/2020  Transportation Screening Complete  PCP or Specialist Appt within 3-5 Days Complete  HRI or [place] Complete  Social Work Consult for [place] Planning/Counseling Complete  Palliative Care Screening Complete  Medication Review Press [employer]) Complete  Some recent data might be hidden

## 2020-12-03 NOTE — Progress Notes (Signed)
Occupational Therapy Treatment Patient Details Name: Charlene [name] MRN: [id number] DOB: 11-24-1965 Today's Date: 12/03/2020    History of present illness Pt is 55 y/o female presents to [facility] on 08/26/20 with L sided weakness and headaches. 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 s/p Right frontal ventricular catheter placement on 2/10, stopped functioning on 2/11.  MRI 2/11 shows right greater than left mesial temporal lobes / parahippocampal cortex, bilateral basal ganglia, and possibly the upper pons that are concerning for acute infarcts, most likely secondary to mass effect from the hemorrhage. ETT 2/10-2/24.  Pt with trach and PEG on 09/10/20. Hospital course complicated by persistent fever, Serratia pneumonia and now pre-orbital cellulitis.   PMH includes anxiety, HTN.   OT comments  Patient supine in bed, session focused on tilt tolerance.  Tolerated up to 35 degrees today, BP remained stable but gradually decreased with pt demonstrating decreased alertness, engagement and movement of R UE with sustained 35 degree tilt.  When tilt decreased pt more alert and moving R UE.  Able to tolerate chair position at 58* at end of session with VSS. Provided BLE stretch during session, more vocal and reaching with R UE to discomfort in R knee.  Scanning initially to locate therapist/mom but with fatigue fades.  Will follow acutely.    Follow Up Recommendations  SNF;Supervision/Assistance - 24 hour    Equipment Recommendations  None recommended by OT    Recommendations for Other Services      Precautions / Restrictions Precautions Precautions: Fall Precaution Comments: peg and trach Required Braces or Orthoses: Other Brace Other Brace: RLE soft knee brace and LUE soft elbow brace (wearing schedule in RN orders); bil PRAFOs Restrictions Weight Bearing  Restrictions: No       Mobility Bed Mobility                    Transfers                 General transfer comment: focus on tilt tolerance today    Balance                                           ADL either performed or assessed with clinical judgement   ADL                                               Vision   Additional Comments: pt able to target therapist on R side, scan towards left but limited attention to task   Perception     Praxis      Cognition Arousal/Alertness: Awake/alert Behavior During Therapy: Flat affect Overall Cognitive Status: Impaired/Different from baseline Area of Impairment: Attention;Following commands;Problem solving                   Current Attention Level: Focused   Following Commands: Follows one step commands with increased time;Follows one step commands inconsistently     Problem Solving: Slow processing;Decreased initiation;Requires verbal cues;Difficulty sequencing;Requires tactile cues General Comments: pt purposefully reaching towards R knee during stretch and increased vocalizations due to discomfort.  continues with focused attention, requires  repeated cues. fatigues easily        Exercises Other Exercises Other Exercises: bil LB knee extension stretches   Shoulder Instructions       General Comments supine 141/63, progressing incline to 20 degress 122/86, 35 degrees 114/77; chair positoin 58* 129/84    Pertinent Vitals/ Pain       Pain Assessment: Faces Faces Pain Scale: Hurts even more Pain Location: with bil knee ROM R>L Pain Descriptors / Indicators: Grimacing;Guarding Pain Intervention(s): Limited activity within patient's tolerance;Monitored during session;Repositioned  Home Living                                          Prior Functioning/Environment              Frequency  Min 2X/week        Progress Toward  Goals  OT Goals(current goals can now be found in the care plan section)  Progress towards OT goals: Progressing toward goals (slowly)  Acute Rehab OT Goals Patient Stated Goal: Pt unable to state  Plan Discharge plan remains appropriate;Frequency remains appropriate    Co-evaluation                 AM-PAC OT "6 Clicks" Daily Activity     Outcome Measure   Help from another person eating meals?: Total Help from another person taking care of personal grooming?: Total Help from another person toileting, which includes using toliet, bedpan, or urinal?: Total Help from another person bathing (including washing, rinsing, drying)?: Total Help from another person to put on and taking off regular upper body clothing?: Total Help from another person to put on and taking off regular lower body clothing?: Total 6 Click Score: 6    End of Session Equipment Utilized During Treatment: Oxygen  OT Visit Diagnosis: Muscle weakness (generalized) (M62.81);Apraxia (R48.2);Cognitive communication deficit (R41.841);Hemiplegia and hemiparesis Symptoms and signs involving cognitive functions: Other Nontraumatic ICH Hemiplegia - Right/Left: Right Hemiplegia - caused by: Other Nontraumatic intracranial hemorrhage   Activity Tolerance Patient tolerated treatment well   Patient Left with call bell/phone within reach;Other (comment);with family/visitor present (chair position)   Nurse Communication Mobility status        Time: [phone] OT Time Calculation (min): 54 min  Charges: OT General Charges $OT Visit: 1 Visit OT Treatments $Therapeutic Activity: 53-67 mins  Barry Brunner, OT Acute Rehabilitation Services Pager [phone] Office [phone]    [name] 12/03/2020, 1:51 PM

## 2020-12-03 NOTE — Progress Notes (Signed)
[facility] PROGRESS NOTE  Charlene [name] VOJ:[id number] DOB: March 18, 1966 DOA: 08/26/2020 PCP: Patient, [name] (Inactive)    October 08, 2020     10/16/2020                        10/16/2020     10/18/2020                        10/20/2020    Status: Remains inpatient appropriate because:Altered mental status, Unsafe d/c plan, IV treatments appropriate due to intensity of illness or inability to take PO and Inpatient level of care appropriate due to severity of illness   Dispo: The patient is from: Home              Anticipated d/c is to: SNF trach capable-[place] but [place] available              Patient currently is medically stable to d/c.   Difficult to place patient Yes   Level of care: Telemetry Medical  Code Status: Full Family Communication: husband Charlene [name] [phone]) is her POA and legal contact.  Charlene [name] was at bedside on 5/18 and given update DVT prophylaxis: Chronically bedbound.  5/6: We will discontinue prophylactic Lovenox   Vaccination status: Unvaccinated-have discussed both with patient's mother and with husband Charlene [name].  He wishes to speak to the rest of the family before making a decision but is leaning towards Charlene [name] vaccine.  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  Subsequently on 2/10, she developed obtundation and respiratory arrest leading to intubation.  She has remained persistently obtunded, now has  a trach and a PEG.  Hospital course complicated by persistent fever, Serratia pneumonia and now pre-orbital cellulitis  As of 3/25 with adjustments in hypertonicity medications patient began to improve regarding hypertonicity.  Eventually started on provigil with improvements in alertness.  Evaluated by rehab physician who recommended increasing provigil dose further.  Subsequently patient has become more alert.  Most days she is able to track and with encouragement able to follow simple commands especially since partial obstruction glasses placed.  See progress note dictated 4/27 for expanded details.   2/09 Admit for [place] 2/10 EVD placed, later intubated due to obtundation and respiratory arrest 2/11 MRI Brain, hypertonic saline d/c'd, EVD not working.  Husband did not consent to PICC  2/13 EVD removed 2/17 [place] discussion with family, PMT 2/24 Bedside trach and PEG 3/1 Transferred to [facility]  Subjective: Patient alert today.  Mother at bedside.  She is tracking consistently and able to purposefully move left eye as well.  Therapy notes reviewed from 5/18 and with PMV valve in place and husband at bedside patient spontaneously spoke to her husband stating "I love you".  Objective: Vitals:   12/03/20 0358 12/03/20 0810  BP: (!) 113/93 121/84  Pulse: 62 85  Resp: 17 15  Temp: 98.3 F (36.8 C)  SpO2: 98% 99%    Intake/Output Summary (Last 24 hours) at 12/03/2020 0842 Last data filed at 12/03/2020 0614 Gross per 24 hour  Intake 0 ml  Output 950 ml  Net -950 ml   Filed Weights   11/22/20 0500 11/25/20 0346 12/03/20 0500  Weight: 76 kg 75.3 kg 50.8 kg    Exam:  Constitutional: Alert, calm, interactive for her.  Peers to be comfortable. Respiratory: #4.0 cuffless trach, trach collar with FiO2 21% at 5L, PMV in place.  No increased work of breathing.  Lungs are clear.  Managing oral secretions without drooling. Cardiovascular: Normal heart sounds, regular pulse, no peripheral  edema Abdomen:  PEG tube tube feedings infusing-LBM 5/19, abdomen soft nontender nondistended with normoactive bowel sounds. Musculoskeletal: Bilateral atrophy of thigh and calf muscles Neurologic: Known left facial droop, intermittent spontaneous movement of upper extremities especially RUE.  Wearing partial occlusion glasses.  More consistent tracking with right eye and is occurring past the midline which is new.  Also continues to be able to reposition left eye from disconjugate downward gaze to midline and tracks more towards the left as well. Psych: Alert, remains nonverbal during my encounters.  Assessment/Plan: Acute problems: Multiple acute strokes  Right Thalamus ICH with IVH  Obstructive Hydrocephalus/Acute on persistent metabolic Encephalopathy/brain injury related hypertonicity -Continue baclofen and Provigil -Laxative induced diarrhea resolved-have resumed lower dose -Continue PT/OT/SLP -5/19 discussed with SLP/Charlene [name] who states patient has been doing extremely well with PMV and feels she is appropriate to begin capping trials.  It is hopeful she will tolerate capping trials and thus be reconsidered for possible decannulation. -5/2, CIR MD reevaluated patient " Mrs. Charlene [name] unfortunately has not made any meaningful functional gains over the last month. She remains most appropriate for SNF level care".   Hypertensive emergency/Malignant HTN -Due to financial constraints patient was not taking carvedilol and hydrochlorothiazide prior to admission -Continue Norvasc, Lopressor and hydralazine.   Acute hypoxemic respiratory failure secondary to CVA requiring mechanical ventilation (Resolved) Trach dependent -Downsized to #4 cuffless trach on 5/8 -Per trach team: Has significant altered mentation and inability to manage oral secretions at present time is not a safe candidate for decannulation.  Discussed this with family as major obstacle in discharging to home.  This will also  decrease options for SNF placement. -5/9: Reevaluated by trach team.  Again they reiterate patient is not a good candidate for decannulation "unless the family is prepared to transition to comfort should she deteriorate.".  PCCM will continue to follow weekly  Dysphagia -Continue tube feedings, Prosource liquid, free water -Follow CBGs and provide SSI      Other problems: Right upper extremity thrombophlebitis - [name] -> age indeterminate SVT of right cephalic vein  Neck Contracture/torticollis -- Resolved  ESBL positive Enterobacter UTI -Has completed 5 days of meropenem -Contact isolation during hospitalization -continue Oxybutynin suspected bladder spasms  Bacterial Conjunctivitis /periorbital cellulitis -Resolved  Chronic pansinusitis/recurrent fevers 2/2 aspiration PNA;History of Serratia Pneumonia  Hospital Acquired Pneumonia:  -Completed several courses of antibiotic -Follow-up imaging including sinus CT consistent with chronic but stable and resolving sinusitis   Anemia -Stable  HLD -Continue Lipitor      Data Reviewed: Basic Metabolic Panel: Recent Labs  Lab 11/26/20 0906  NA 139  K 3.7  CL 100  CO2 30  GLUCOSE 134*  BUN 14  CREATININE 0.51  CALCIUM 9.4   Liver Function Tests: No results for input(s): AST, ALT, ALKPHOS, BILITOT, PROT, ALBUMIN in the last 168 hours. CBC: Recent  Labs  Lab 11/26/20 0906  WBC 5.8  HGB 10.8*  HCT 34.1*  MCV 80.2  PLT 337    CBG: Recent Labs  Lab 12/02/20 1546 12/02/20 1956 12/02/20 2356 12/03/20 0400 12/03/20 0809  GLUCAP 103* 124* 136* 100* 120*     Studies: No results found.  Scheduled Meds: . amLODipine  10 mg Per Tube Daily  . atorvastatin  40 mg Per Tube Daily  . baclofen  7.5 mg Per Tube 2 times per day  . chlorhexidine gluconate (MEDLINE KIT)  15 mL Mouth Rinse BID  . Chlorhexidine Gluconate Cloth  6 each Topical Daily  . feeding supplement (PROSource TF)  45 mL Per Tube Daily  . free  water  100 mL Per Tube Q4H  . hydrALAZINE  50 mg Per Tube Q8H  . insulin aspart  0-9 Units Subcutaneous Q4H  . mouth rinse  15 mL Mouth Rinse 5 X Daily  . metoprolol tartrate  100 mg Per Tube BID  . modafinil  200 mg Per Tube Daily  . oxybutynin  5 mg Per Tube BID  . pantoprazole sodium  40 mg Per Tube BID  . polyethylene glycol  17 g Per Tube QODAY  . tiZANidine  2 mg Per Tube QHS   Continuous Infusions: . sodium chloride Stopped (10/26/20 1445)  . 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   Palliative care.    PCCM continues to follow.  No decannulation.   Procedures:  Tracheostomy  PEG tube  EEG  Echocardiogram  Antibiotics: Vancomycin 2/12-2/15 Maxipime 2/12-2/15 Ancef 2/19-2/27 Vancomycin 2/20 x 1 dose Maxipime 2/27- 3/5 Vancomycin 3/1-3/3 Ancef 3/6-3/14 Cipro 3/20 4-3/30 Unasyn 3/30-4/4 Rocephin 4/11-4/12 Meropenem 4/12-4/17   Time spent: 25 minutes    Erin Hearing ANP  [facility] 7 am - 330 pm/M-F for direct patient care and secure chat Please refer to [place] for contact info 98  Days

## 2020-12-04 ENCOUNTER — Inpatient Hospital Stay (HOSPITAL_COMMUNITY): Payer: Medicaid Other

## 2020-12-04 LAB — COMPREHENSIVE METABOLIC PANEL
ALT: 38 U/L (ref 0–44)
AST: 24 U/L (ref 15–41)
Albumin: 3 g/dL — ABNORMAL LOW (ref 3.5–5.0)
Alkaline Phosphatase: 109 U/L (ref 38–126)
Anion gap: 6 (ref 5–15)
BUN: 17 mg/dL (ref 6–20)
CO2: 30 mmol/L (ref 22–32)
Calcium: 9.2 mg/dL (ref 8.9–10.3)
Chloride: 102 mmol/L (ref 98–111)
Creatinine, Ser: 0.54 mg/dL (ref 0.44–1.00)
GFR, Estimated: >60 mL/min (ref >60)
Glucose, Bld: 131 mg/dL — ABNORMAL HIGH (ref 70–99)
Potassium: 4 mmol/L (ref 3.5–5.1)
Sodium: 138 mmol/L (ref 135–145)
Total Bilirubin: 0.2 mg/dL — ABNORMAL LOW (ref 0.3–1.2)
Total Protein: 7.1 g/dL (ref 6.5–8.1)

## 2020-12-04 LAB — URINALYSIS, ROUTINE W REFLEX MICROSCOPIC
Bilirubin Urine: NEGATIVE
Glucose, UA: NEGATIVE mg/dL
Hgb urine dipstick: NEGATIVE
Ketones, ur: NEGATIVE mg/dL
Nitrite: NEGATIVE
Protein, ur: NEGATIVE mg/dL
Specific Gravity, Urine: 1.01 (ref 1.005–1.030)
pH: 7 (ref 5.0–8.0)

## 2020-12-04 LAB — GLUCOSE, CAPILLARY
Glucose-Capillary: 106 mg/dL — ABNORMAL HIGH (ref 70–99)
Glucose-Capillary: 116 mg/dL — ABNORMAL HIGH (ref 70–99)
Glucose-Capillary: 138 mg/dL — ABNORMAL HIGH (ref 70–99)
Glucose-Capillary: 144 mg/dL — ABNORMAL HIGH (ref 70–99)
Glucose-Capillary: 146 mg/dL — ABNORMAL HIGH (ref 70–99)
Glucose-Capillary: 150 mg/dL — ABNORMAL HIGH (ref 70–99)

## 2020-12-04 LAB — URINALYSIS, MICROSCOPIC (REFLEX): Squamous Epithelial / HPF: >50 (ref 0–5)

## 2020-12-04 LAB — CBC WITH DIFFERENTIAL/PLATELET
Abs Immature Granulocytes: 0.01 10*3/uL (ref 0.00–0.07)
Basophils Absolute: 0 10*3/uL (ref 0.0–0.1)
Basophils Relative: 1 %
Eosinophils Absolute: 0.1 10*3/uL (ref 0.0–0.5)
Eosinophils Relative: 1 %
HCT: 33.9 % — ABNORMAL LOW (ref 36.0–46.0)
Hemoglobin: 10.6 g/dL — ABNORMAL LOW (ref 12.0–15.0)
Immature Granulocytes: 0 %
Lymphocytes Relative: 47 %
Lymphs Abs: 2.7 10*3/uL (ref 0.7–4.0)
MCH: 25.5 pg — ABNORMAL LOW (ref 26.0–34.0)
MCHC: 31.3 g/dL (ref 30.0–36.0)
MCV: 81.7 fL (ref 80.0–100.0)
Monocytes Absolute: 0.4 10*3/uL (ref 0.1–1.0)
Monocytes Relative: 7 %
Neutro Abs: 2.5 10*3/uL (ref 1.7–7.7)
Neutrophils Relative %: 44 %
Platelets: 324 10*3/uL (ref 150–400)
RBC: 4.15 MIL/uL (ref 3.87–5.11)
RDW: 15.1 % (ref 11.5–15.5)
WBC: 5.6 10*3/uL (ref 4.0–10.5)
nRBC: 0 % (ref 0.0–0.2)

## 2020-12-04 LAB — RESP PANEL BY RT-PCR (FLU A&B, COVID) ARPGX2
Influenza A by PCR: NEGATIVE
Influenza B by PCR: NEGATIVE
SARS Coronavirus 2 by RT PCR: NEGATIVE

## 2020-12-04 LAB — C-REACTIVE PROTEIN: CRP: 1.1 mg/dL — ABNORMAL HIGH (ref <1.0)

## 2020-12-04 IMAGING — DX DG CHEST 1V PORT
1 series · 1 of 1 positions shown · non-contrast
Comparison: [DATE]

CLINICAL DATA: Headache and left-sided weakness

EXAM:
PORTABLE CHEST 1 VIEW

[chest ap]
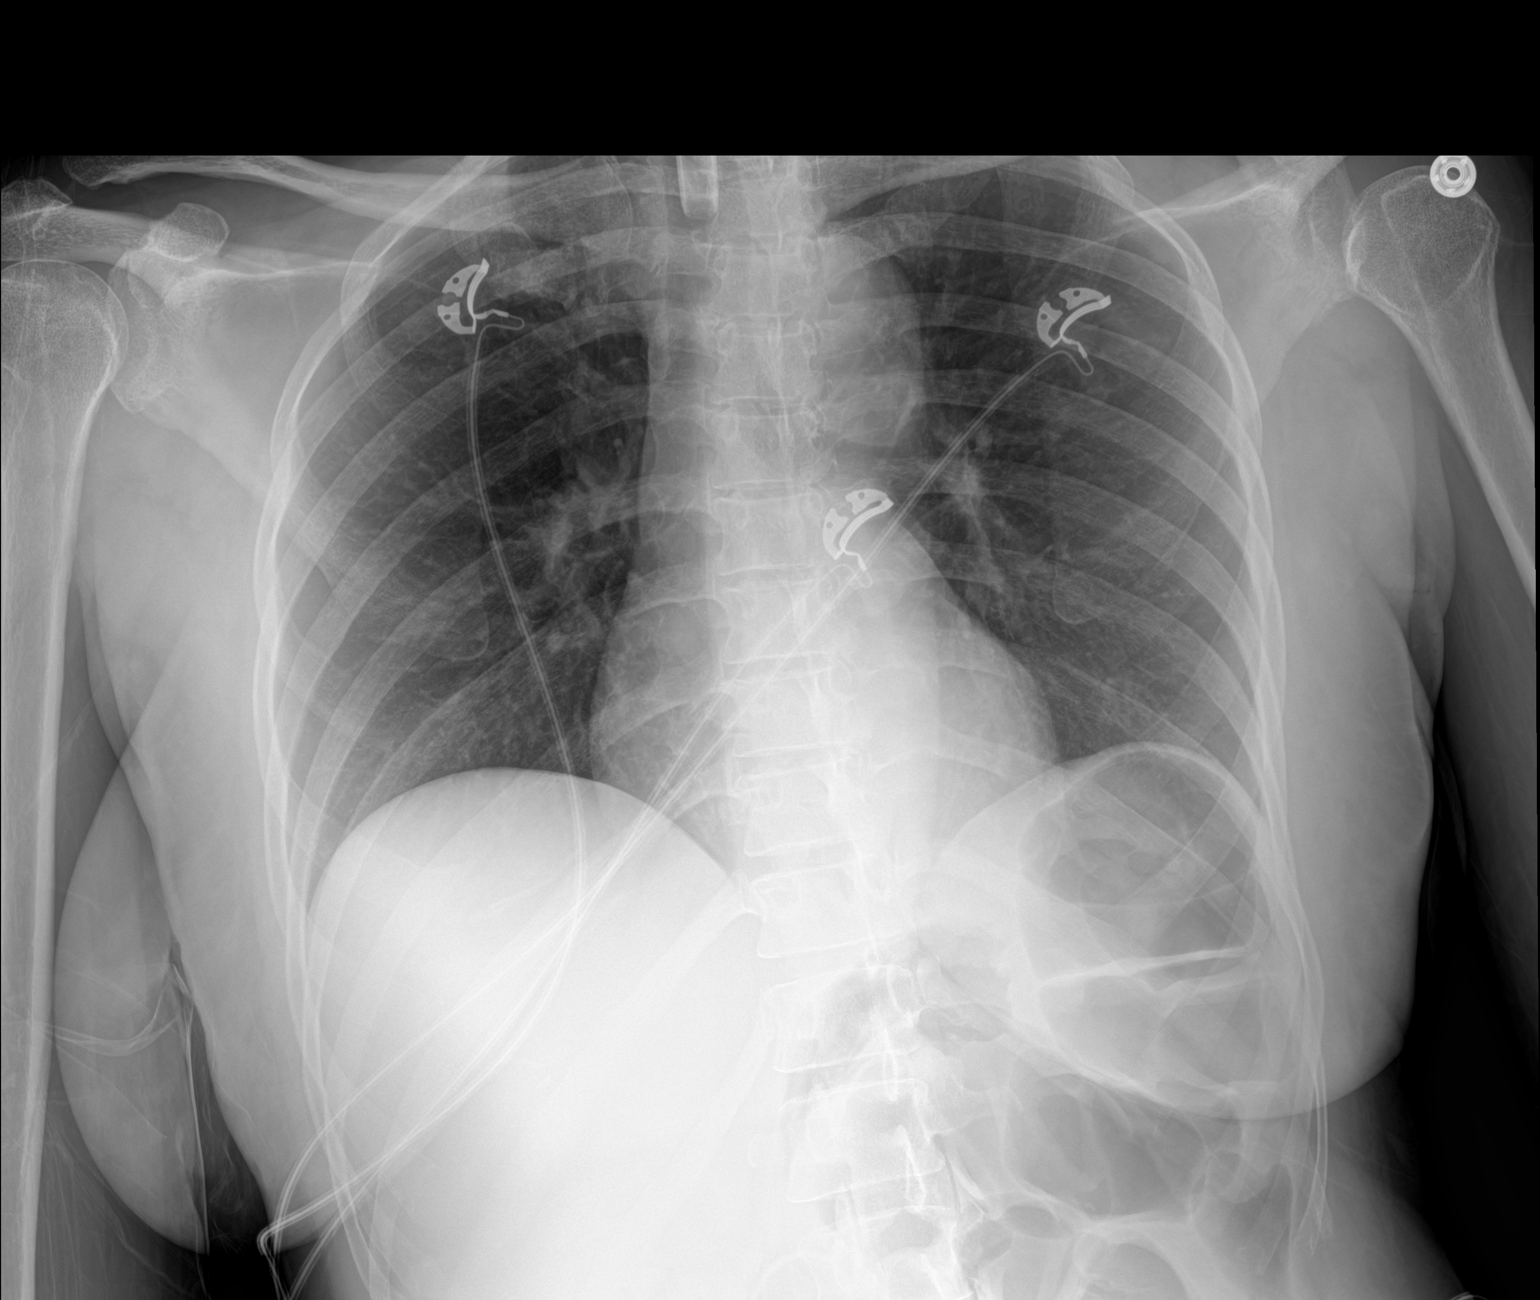

[1 of 1 positions shown; findings below may reference images not displayed]

FINDINGS: Tracheostomy catheter tip is 5.2 cm above the carina. No
pneumothorax. The lungs are clear. The heart size and pulmonary
vascularity are normal. No adenopathy. No bone lesions.
IMPRESSION: Tracheostomy as described without pneumothorax. Lungs clear. Cardiac
silhouette normal.

## 2020-12-04 MED ORDER — SODIUM CHLORIDE 0.9 % IV SOLN
1.0000 g | Freq: Three times a day (TID) | INTRAVENOUS | Status: DC
  Administered 2020-12-04 – 2020-12-06 (×6): 1 g via INTRAVENOUS
  Filled 2020-12-04 (×9): qty 1

## 2020-12-04 MED ORDER — SODIUM CHLORIDE 0.9 % IV BOLUS
500.0000 mL | Freq: Once | INTRAVENOUS | Status: AC
  Administered 2020-12-04: 500 mL via INTRAVENOUS

## 2020-12-04 NOTE — Progress Notes (Signed)
Pt trach re-capped as it was taken off for sleep last night. Pt to remain capped for 48 hrs per MD. Pt with strong, dry cough.

## 2020-12-04 NOTE — Progress Notes (Signed)
Nutrition Follow-up  DOCUMENTATION CODES:  Not applicable  INTERVENTION:  Continue Tube feeding via PEG tube: -Osmolite 1.2 at 55 ml/h (1320 ml per day) -45 ml ProSource TF daily  Provides 1624 kcal, 84 gm protein, 1070 ml free water daily  -100 ml free water every 4 hours    NUTRITION DIAGNOSIS:  Inadequate oral intake related to inability to eat as evidenced by NPO status. -- ongoing  GOAL:  Patient will meet greater than or equal to 90% of their needs -- Met with TF  MONITOR:  TF tolerance,Vent status  REASON FOR ASSESSMENT:  Consult,Ventilator Enteral/tube feeding initiation and management  ASSESSMENT:  Pt with PMH significant for HTN admitted with with 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  Subsequently on 2/10, pt developed obtundation and respiratory arrest leading to intubation. Pt is now s/p trach/PEG placement. Hospital course complicated by Serratia pneumonia, ESBL Enterobacter UTI and pre-orbital cellulitis.  2/10 s/p EVD placement and intubation for respiratory arrest 2/13 EVD removed 2/17 palliative care consult; family requesting tx to [facility]  2/24 s/p trach and PEG  3/1 tx to [facility] 3/11 trach changed to [place] #6 cuffless 4/08 pt w/ sepsis 2/2 UTI 4/18 28% ATC/5L 5/2 CIR reevaluated pt and determined she has not made enough improvement for admit; recommended SNF 5/8 trach changed to [place] #4 cuffless   Capping trials have been initiated as pt has been more alert and interactive over the past several weeks. RN and family report pt having more spontaneous coughing.   Pt continues to be tolerating TF via PEG per RN. Current regimen: Osmolite 1.2 at 55 ml/h w/ 45 ml ProSource TF daily, 124m free water flushes Q4H.   UOP: [id number] hours   Medications: SSI Q4H, protonix, miralax Labs reviewed. CBGs [id number] Order:   Diet Order            Diet NPO time  specified  Diet effective now                EDUCATION NEEDS:  No education needs have been identified at this time  Skin:  Skin Assessment: Reviewed RN Assessment (MASD bilateral groin)  Last BM:  5/18 type 7  Height:  Ht Readings from Last 1 Encounters:  10/18/20 '5\' 5"'  (1.651 m)   Weight:  Wt Readings from Last 1 Encounters:  12/03/20 50.8 kg   Ideal Body Weight:  56.8 kg  BMI:  Body mass index is 18.64 kg/m.  Estimated Nutritional Needs:  Kcal:  1600-1800 Protein:  80-90 grams Fluid:  >1.6L/d    AmLarkin InaMS, RD, LDN RD pager number and weekend/on-call pager number located in [facility]

## 2020-12-04 NOTE — Progress Notes (Addendum)
[facility] PROGRESS NOTE  Charlene [name] EXN:[id number] DOB: July 24, 1965 DOA: 08/26/2020 PCP: Patient, [name] (Inactive)    October 08, 2020     10/16/2020                        10/16/2020     10/18/2020                        10/20/2020    Status: Remains inpatient appropriate because:Altered mental status, Unsafe d/c plan, IV treatments appropriate due to intensity of illness or inability to take PO and Inpatient level of care appropriate due to severity of illness   Dispo: The patient is from: Home              Anticipated d/c is to: SNF trach capable[place]              Patient currently is medically stable to d/c.   Difficult to place patient Yes   Level of care: Telemetry Medical  Code Status: Full Family Communication: husband Charlene [name] [phone]) is her POA and legal contact.  [name] was at bedside on 5/18 and given update DVT prophylaxis: Chronically bedbound.  5/6: We will discontinue prophylactic Lovenox   Vaccination status: Unvaccinated-have discussed both with patient's mother and with husband Charlene [name].  He wishes to speak to the rest of the family before making a decision but is leaning towards [name] vaccine.  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  Subsequently on 2/10, she developed obtundation and respiratory arrest leading to intubation.  She has remained persistently obtunded, now has  a trach and a PEG.  Hospital course complicated by persistent fever, Serratia pneumonia and now pre-orbital cellulitis  As of 3/25 with adjustments in hypertonicity medications patient began to improve regarding hypertonicity.  Eventually started on provigil with improvements in alertness.  Evaluated by rehab physician who recommended increasing provigil dose further.  Subsequently patient has become more alert.  Most days she is able to track and with encouragement able to follow simple commands especially since partial obstruction glasses placed.  See progress note dictated 4/27 for expanded details.   2/09 Admit for [place] 2/10 EVD placed, later intubated due to obtundation and respiratory arrest 2/11 MRI Brain, hypertonic saline d/c'd, EVD not working.  Husband did not consent to PICC  2/13 EVD removed 2/17 [place] discussion with family, PMT 2/24 Bedside trach and PEG 3/1 Transferred to [facility]  Subjective: Upon initial evaluation patient very sleepy and skin hot to touch.  Returned later with mother at bedside patient now awake and tracking.  Mother attempted to move to other side of bed and patient made incomprehensible phonation.  Objective: Vitals:   12/04/20 0417 12/04/20 0420  BP: 128/78   Pulse: 96 96  Resp: 19 19  Temp: (!) 100.4 F (38 C)   SpO2: 99% 99%    Intake/Output Summary (  Last 24 hours) at 12/04/2020 0756 Last data filed at 12/04/2020 0451 Gross per 24 hour  Intake 0 ml  Output 650 ml  Net -650 ml   Filed Weights   11/22/20 0500 11/25/20 0346 12/03/20 0500  Weight: 76 kg 75.3 kg 50.8 kg    Exam:  Constitutional: Initially very sleepy and lethargic but later awakened when mother presented to bedside. Respiratory: #4.0 cuffless trach, trach collar with FiO2 21% at 5L, PMV in place.   Cardiovascular: Heart sounds, normotensive, some resting tachycardia, skin hot and dry Abdomen:  PEG tube tube feedings infusing-LBM 5/19, soft nontender nondistended with normoactive  bowel sounds Musculoskeletal: Bilateral atrophy of thigh and calf muscles Neurologic: Known left facial droop, intermittent spontaneous movement of upper extremities especially RUE.  Continues to track primarily with right eye and has improved EOM of left eye. Psych: Awake.  Incomprehensible grunting x1 during my encounter today  Assessment/Plan: Acute problems: Multiple acute strokes  Right Thalamus ICH with IVH  Obstructive Hydrocephalus/Acute on persistent metabolic Encephalopathy/brain injury related hypertonicity -Continue baclofen and Provigil -Continue bowel regimen with low-dose laxatives daily -Continue PT/OT/SLP -5/2, CIR MD reevaluated patient " Mrs. Sherrow unfortunately has not made any meaningful functional gains over the last month. She remains most appropriate for SNF level care".   Fever -Upon my initial evaluation today patient had very hot skin to touch.  I checked an oral temperature and this was 99.8. -COVID and influenza negative -Urinalysis consistent with UTI and with history of ESBL Enterococcus we will begin meropenem empirically until urine culture returned -Chest x-ray unremarkable -Blood cultures pending  Hypertensive emergency/Malignant HTN -Due to financial constraints patient was not taking carvedilol and hydrochlorothiazide prior to admission -Continue Norvasc, Lopressor and hydralazine.   Acute hypoxemic respiratory failure secondary to CVA requiring mechanical ventilation (Resolved) Trach dependent -Downsized to #4 cuffless trach on 5/8 -Per trach team: Has significant altered mentation and inability to manage oral secretions at present time is not a safe candidate for decannulation.  Discussed this with family as major obstacle in discharging to home.  This will also decrease options for SNF placement. -5/9: Reevaluated by trach team.  Again they reiterate patient is not a good candidate for decannulation "unless the family is prepared to transition  to comfort should she deteriorate.".  PCCM will continue to follow weekly -Have begun capping trials.  Patient has been more alert and interactive over the past several weeks and according to nursing and family she has had more spontaneous coughing.  Dysphagia -Continue tube feedings, Prosource liquid, free water -Follow CBGs and provide SSI      Other problems: Right upper extremity thrombophlebitis - [name] -> age indeterminate SVT of right cephalic vein  Neck Contracture/torticollis -- Resolved  ESBL positive Enterobacter UTI -Has completed 5 days of meropenem -Contact isolation during hospitalization -continue Oxybutynin suspected bladder spasms  Bacterial Conjunctivitis /periorbital cellulitis -Resolved  Chronic pansinusitis/recurrent fevers 2/2 aspiration PNA;History of Serratia Pneumonia  Hospital Acquired Pneumonia:  -Completed several courses of antibiotic -Follow-up imaging including sinus CT consistent with chronic but stable and resolving sinusitis   Anemia -Stable  HLD -Continue Lipitor      Data Reviewed: Basic Metabolic Panel: No results for input(s): NA, K, CL, CO2, GLUCOSE, BUN, CREATININE, CALCIUM, MG, PHOS in the last 168 hours. Liver Function Tests: No results for input(s): AST, ALT, ALKPHOS, BILITOT, PROT, ALBUMIN in the last 168 hours. CBC: No results for input(s): WBC, NEUTROABS, HGB, HCT, MCV, PLT in the last 168 hours.  CBG: Recent Labs  Lab 12/03/20 1539 12/03/20 2011 12/04/20 0013 12/04/20 0414 12/04/20 0749  GLUCAP 123* 116* 146* 116* 150*     Studies: No results found.  Scheduled Meds: . amLODipine  10 mg Per Tube Daily  . atorvastatin  40 mg Per Tube Daily  . baclofen  7.5 mg Per Tube 2 times per day  . chlorhexidine gluconate (MEDLINE KIT)  15 mL Mouth Rinse BID  . Chlorhexidine Gluconate Cloth  6 each Topical Daily  . feeding supplement (PROSource TF)  45 mL Per Tube Daily  . free water  100 mL Per Tube Q4H  .  hydrALAZINE  50 mg Per Tube Q8H  . insulin aspart  0-9 Units Subcutaneous Q4H  . mouth rinse  15 mL Mouth Rinse 5 X Daily  . metoprolol tartrate  100 mg Per Tube BID  . modafinil  200 mg Per Tube Daily  . oxybutynin  5 mg Per Tube BID  . pantoprazole sodium  40 mg Per Tube BID  . polyethylene glycol  17 g Per Tube QODAY  . tiZANidine  2 mg Per Tube QHS   Continuous Infusions: . sodium chloride Stopped (10/26/20 1445)  . feeding supplement (OSMOLITE 1.2 CAL) 1,000 mL (12/04/20 0424)    Principal Problem:   ICH (intracerebral hemorrhage) ([place]) Active Problems:   Acute hypoxemic respiratory failure (HCC)   Status post tracheostomy ([place])   Palliative care by specialist   Muscle hypertonicity   Oropharyngeal dysphagia   Poor prognosis   Aspiration pneumonia of right lower lobe ([place])   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   Acute cystitis without hematuria   Consultants:  Neurology   Neurosurgery.   Palliative care.    PCCM continues to follow.  No decannulation.   Procedures:  Tracheostomy  PEG tube  EEG  Echocardiogram  Antibiotics: Vancomycin 2/12-2/15 Maxipime 2/12-2/15 Ancef 2/19-2/27 Vancomycin 2/20 x 1 dose Maxipime 2/27- 3/5 Vancomycin 3/1-3/3 Ancef 3/6-3/14 Cipro 3/20 4-3/30 Unasyn 3/30-4/4 Rocephin 4/11-4/12 Meropenem 4/12-4/17   Time spent: 25 minutes    Erin Hearing ANP  [facility] 7 am - 330 pm/M-F for direct patient care and secure chat Please refer to [place] for contact info 99  Days

## 2020-12-05 DIAGNOSIS — S069X9A Unspecified intracranial injury with loss of consciousness of unspecified duration, initial encounter: Secondary | ICD-10-CM

## 2020-12-05 LAB — GLUCOSE, CAPILLARY
Glucose-Capillary: 105 mg/dL — ABNORMAL HIGH (ref 70–99)
Glucose-Capillary: 118 mg/dL — ABNORMAL HIGH (ref 70–99)
Glucose-Capillary: 122 mg/dL — ABNORMAL HIGH (ref 70–99)
Glucose-Capillary: 122 mg/dL — ABNORMAL HIGH (ref 70–99)
Glucose-Capillary: 129 mg/dL — ABNORMAL HIGH (ref 70–99)
Glucose-Capillary: 130 mg/dL — ABNORMAL HIGH (ref 70–99)
Glucose-Capillary: 152 mg/dL — ABNORMAL HIGH (ref 70–99)

## 2020-12-05 MED ORDER — LOPERAMIDE HCL 2 MG PO CAPS
2.0000 mg | ORAL_CAPSULE | Freq: Once | ORAL | Status: AC
  Administered 2020-12-05: 2 mg via ORAL
  Filled 2020-12-05: qty 1

## 2020-12-05 MED ORDER — CHLORHEXIDINE GLUCONATE 0.12 % MT SOLN
OROMUCOSAL | Status: AC
  Filled 2020-12-05: qty 15

## 2020-12-05 NOTE — Progress Notes (Signed)
NT and RN noted pt has diarrhea flowing from vaginal area. RN assessed, cleaned, re-assessed. Purewick discontinued, MD Pokhrel notified, RN instructed to continue to monitor and assess, update MD as needed.

## 2020-12-05 NOTE — Plan of Care (Signed)
  Problem: Education: Goal: Knowledge of disease or condition will improve Outcome: Progressing Goal: Knowledge of patient specific risk factors addressed and post discharge goals established will improve Outcome: Progressing   Problem: Coping: Goal: Will identify appropriate support needs Outcome: Progressing   Problem: Health Behavior/Discharge Planning: Goal: Ability to manage health-related needs will improve Outcome: Progressing   Problem: Self-Care: Goal: Verbalization of feelings and concerns over difficulty with self-care will improve Outcome: Progressing Goal: Ability to communicate needs accurately will improve Outcome: Progressing   Problem: Nutrition: Goal: Risk of aspiration will decrease Outcome: Progressing   Problem: Intracerebral Hemorrhage Tissue Perfusion: Goal: Complications of Intracerebral Hemorrhage will be minimized Outcome: Progressing   Problem: Education: Goal: Knowledge of General Education information will improve Description: Including pain rating scale, medication(s)/side effects and non-pharmacologic comfort measures Outcome: Progressing   Problem: Health Behavior/Discharge Planning: Goal: Ability to manage health-related needs will improve Outcome: Progressing   Problem: Clinical Measurements: Goal: Ability to maintain clinical measurements within normal limits will improve Outcome: Progressing Goal: Will remain free from infection Outcome: Progressing Goal: Diagnostic test results will improve Outcome: Progressing Goal: Respiratory complications will improve Outcome: Progressing Goal: Cardiovascular complication will be avoided Outcome: Progressing   Problem: Coping: Goal: Level of anxiety will decrease Outcome: Progressing   Problem: Elimination: Goal: Will not experience complications related to bowel motility Outcome: Progressing Goal: Will not experience complications related to urinary retention Outcome: Progressing    Problem: Pain Managment: Goal: General experience of comfort will improve Outcome: Progressing   Problem: Skin Integrity: Goal: Risk for impaired skin integrity will decrease Outcome: Progressing   Problem: Intracerebral Hemorrhage Tissue Perfusion: Goal: Complications of Intracerebral Hemorrhage will be minimized Outcome: Progressing   Problem: Ischemic Stroke/TIA Tissue Perfusion: Goal: Complications of ischemic stroke/TIA will be minimized Outcome: Progressing   

## 2020-12-05 NOTE — Progress Notes (Signed)
[facility] PROGRESS NOTE  Charlene [name] ZOX:[id number] DOB: 1966-01-16 DOA: 08/26/2020 PCP: Patient, [name] (Inactive)   HPI/brief narrative: 55 yo F with 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  Subsequently on 2/10, she developed obtundation and respiratory arrest leading to intubation.  She has remained persistently obtunded, now has a trach and a PEG.  Hospital course complicated by persistent fever, Serratia pneumonia and now pre-orbital cellulitis  As of 3/25 with adjustments in hypertonicity medications patient began to improve regarding hypertonicity.  Eventually started on provigil with improvements in alertness.  Evaluated by rehab physician who recommended increasing provigil dose further.  Subsequently patient has become more alert.  Most days she is able to track and with encouragement able to follow simple commands especially since partial obstruction glasses placed.  See progress note dictated 4/27 for expanded details.   2/09 Admit for [place] 2/10 EVD placed, later intubated due to obtundation and respiratory arrest 2/11 MRI Brain, hypertonic saline d/c'd, EVD not working.  Husband did not consent to PICC  2/13 EVD removed 2/17 [place] discussion with family, PMT 2/24 Bedside trach and PEG 3/1 Transferred to [facility]  Subjective: Today, patient was seen and examined at bedside.  Only opens eyes but not verbal or responsive.  Moaning only.  Objective: Vitals:   12/05/20 0728 12/05/20 1208  BP: (!) 139/92 122/82  Pulse: 95 87  Resp: 18 17  Temp: 97.9 F (36.6 C) 98.3 F (36.8 C)  SpO2: 99% 100%    Intake/Output Summary (Last 24 hours) at 12/05/2020 1512 Last data filed at 12/05/2020 0356 Gross per 24 hour  Intake 731.2 ml  Output 1000 ml  Net -268.8 ml   Filed Weights   11/22/20 0500 11/25/20 0346 12/03/20 0500  Weight: 76 kg 75.3 kg 50.8 kg    Physical exam: General:  Average built, somnolent, eyes spontaneously, not in obvious distress HENT:   No scleral pallor or icterus noted. Oral mucosa is moist.  Tracheostomy In Place Chest:  Clear breath sounds.  Diminished breath sounds bilaterally. No crackles or wheezes.  CVS: S1 &S2 heard. No murmur.  Regular rate and rhythm. Abdomen: Soft, nontender, nondistended.  Bowel sounds are heard.  PEG tube in place. Extremities: Contracted and atrophy of muscles Psych: Spontaneous eye movement, morning only CNS: Left facial droop, intermittent movement of the upper extremities, track with the right eye. Skin: Warm and dry.     Assessment/Plan:  Multiple acute strokes  Right Thalamus ICH with IVH  Obstructive Hydrocephalus/Acute on persistent metabolic Encephalopathy/brain injury related hypertonicity  Continue baclofen, Provigil.  Bowel regimen continue PT OT speech-language therapy.  Currently skilled nursing level of care.  Fever Likely secondary to UTI.  History of ESBL Enterococcus in the past.  On empiric meropenem.  Temperature max of 98.7 F today.  Chest x-ray was unremarkable.  COVID and influenza negative. COVID and influenza negative.  Blood cultures negative so far.  Hypertensive emergency/Malignant HTN Patient was supposed to be on Coreg and HCTZ prior to admission but was not taking it.  Continue Norvasc Lopressor and hydralazine.  Acute hypoxemic respiratory failure secondary to CVA requiring mechanical ventilation/Trach dependent -Downsized to #4 cuffless trach on 5/8.  Continue as per trach team. Continue capping trials.  Dysphagia -Continue tube feedings, Prosource liquid, free water.  Continue sliding scale insulin Accu-Cheks  Right upper extremity thrombophlebitis - [name] -> age indeterminate SVT of right cephalic vein.    Neck Contracture/torticollis --  Resolved  Bacterial Conjunctivitis /periorbital cellulitis -Resolved  Chronic pansinusitis/recurrent fevers 2/2 aspiration  PNA;History of Serratia Pneumonia  Hospital Acquired Pneumonia:  -Completed several courses of antibiotic -Follow-up imaging including sinus CT consistent with chronic but stable and resolving sinusitis.  Currently on meropenem.   Anemia -Stable  Hyperlipidemia -Continue Lipitor   Status: Inpatient Remains inpatient appropriate because:Altered mental status, Unsafe d/c plan, IV treatments appropriate due to intensity of illness or inability to take PO and Inpatient level of care appropriate due to severity of illness   Dispo: The patient is from: Home              Anticipated d/c is to: SNF trach capable-[place] but no beds available              Patient currently is medically stable to d/c.   Difficult to place patient Yes  Code Status: Full code  Family Communication: None today  DVT prophylaxis: Off Lovenox  Data Reviewed: Basic Metabolic Panel: Recent Labs  Lab 12/04/20 1015  NA 138  K 4.0  CL 102  CO2 30  GLUCOSE 131*  BUN 17  CREATININE 0.54  CALCIUM 9.2   Liver Function Tests: Recent Labs  Lab 12/04/20 1015  AST 24  ALT 38  ALKPHOS 109  BILITOT 0.2*  PROT 7.1  ALBUMIN 3.0*   CBC: Recent Labs  Lab 12/04/20 1015  WBC 5.6  NEUTROABS 2.5  HGB 10.6*  HCT 33.9*  MCV 81.7  PLT 324    CBG: Recent Labs  Lab 12/04/20 2023 12/04/20 2347 12/05/20 0354 12/05/20 0802 12/05/20 1210  GLUCAP 138* 130* 122* 129* 152*     Studies: DG CHEST PORT 1 VIEW  Result Date: 12/04/2020 CLINICAL DATA:  Headache and left-sided weakness EXAM: PORTABLE CHEST 1 VIEW COMPARISON:  October 19, 2020 FINDINGS: Tracheostomy catheter tip is 5.2 cm above the carina. No pneumothorax. The lungs are clear. The heart size and pulmonary vascularity are normal. No adenopathy. No bone lesions. IMPRESSION: Tracheostomy as described without pneumothorax. Lungs clear. Cardiac silhouette normal. Electronically Signed   By: Lowella Grip III M.D.   On:  12/04/2020 11:36    Scheduled Meds: . amLODipine  10 mg Per Tube Daily  . atorvastatin  40 mg Per Tube Daily  . baclofen  7.5 mg Per Tube 2 times per day  . chlorhexidine gluconate (MEDLINE KIT)  15 mL Mouth Rinse BID  . Chlorhexidine Gluconate Cloth  6 each Topical Daily  . feeding supplement (PROSource TF)  45 mL Per Tube Daily  . free water  100 mL Per Tube Q4H  . hydrALAZINE  50 mg Per Tube Q8H  . insulin aspart  0-9 Units Subcutaneous Q4H  . mouth rinse  15 mL Mouth Rinse 5 X Daily  . metoprolol tartrate  100 mg Per Tube BID  . modafinil  200 mg Per Tube Daily  . oxybutynin  5 mg Per Tube BID  . pantoprazole sodium  40 mg Per Tube BID  . polyethylene glycol  17 g Per Tube QODAY  . tiZANidine  2 mg Per Tube QHS   Continuous Infusions: . sodium chloride Stopped (10/26/20 1445)  . feeding supplement (OSMOLITE 1.2 CAL) 1,000 mL (12/04/20 0424)  . meropenem (MERREM) IV 1 g (12/05/20 1458)    Principal Problem:   ICH (intracerebral hemorrhage) ([place]) Active Problems:   Acute hypoxemic respiratory failure ([place])   Status post tracheostomy [facility])   Palliative care by specialist  Muscle hypertonicity   Oropharyngeal dysphagia   Poor prognosis   Aspiration pneumonia of right lower lobe (HCC)   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   Acute cystitis without hematuria   Consultants:  Neurology   Neurosurgery.   Palliative care.    PCCM continues to follow.  No decannulation.   Procedures:  Tracheostomy  PEG tube  EEG  Echocardiogram  Antibiotics: Vancomycin 2/12-2/15 Maxipime 2/12-2/15 Ancef 2/19-2/27 Vancomycin 2/20 x 1 dose Maxipime 2/27- 3/5 Vancomycin 3/1-3/3 Ancef 3/6-3/14 Cipro 3/20 4-3/30 Unasyn 3/30-4/4 Rocephin 4/11-4/12 Meropenem 4/12-4/17  Micro Data:  2/9 COVID + Influenza negative 2/10 MRSA PCR negative 2/12 BCx 2 > no growth  2/19 BC 1/2 +Stap EPI, 1/2 no growth  10/07/2020  sputum positive for Serratia marcescens 4/8 Enterobacter cloaca + urine culture MDR sensitive only to imipenem and gentamicin   Antimicrobials:  Cefepime 2/12 > 2/15 Vanc 2/12 > 2/15 10/08/2020 ciprofloxacin>>3/30 Unasyn 3/30 >> 4/02 Rocephin 4/11 > 4/12 Meropenem 4/12>4/17   Flora Lipps, MD [facility] 100  Days

## 2020-12-06 LAB — URINE CULTURE: Culture: >=100000 — AB

## 2020-12-06 LAB — GLUCOSE, CAPILLARY
Glucose-Capillary: 109 mg/dL — ABNORMAL HIGH (ref 70–99)
Glucose-Capillary: 121 mg/dL — ABNORMAL HIGH (ref 70–99)
Glucose-Capillary: 125 mg/dL — ABNORMAL HIGH (ref 70–99)
Glucose-Capillary: 130 mg/dL — ABNORMAL HIGH (ref 70–99)
Glucose-Capillary: 147 mg/dL — ABNORMAL HIGH (ref 70–99)
Glucose-Capillary: 95 mg/dL (ref 70–99)

## 2020-12-06 MED ORDER — SODIUM CHLORIDE 0.9 % IV SOLN
1.0000 g | INTRAVENOUS | Status: AC
  Administered 2020-12-06 – 2020-12-10 (×5): 1 g via INTRAVENOUS
  Filled 2020-12-06 (×5): qty 10

## 2020-12-06 NOTE — Plan of Care (Signed)
  Problem: Education: Goal: Knowledge of disease or condition will improve Outcome: Progressing Goal: Knowledge of patient specific risk factors addressed and post discharge goals established will improve Outcome: Progressing   Problem: Coping: Goal: Will identify appropriate support needs Outcome: Progressing   Problem: Health Behavior/Discharge Planning: Goal: Ability to manage health-related needs will improve Outcome: Progressing   Problem: Self-Care: Goal: Verbalization of feelings and concerns over difficulty with self-care will improve Outcome: Progressing Goal: Ability to communicate needs accurately will improve Outcome: Progressing   Problem: Nutrition: Goal: Risk of aspiration will decrease Outcome: Progressing   Problem: Intracerebral Hemorrhage Tissue Perfusion: Goal: Complications of Intracerebral Hemorrhage will be minimized Outcome: Progressing   Problem: Education: Goal: Knowledge of General Education information will improve Description: Including pain rating scale, medication(s)/side effects and non-pharmacologic comfort measures Outcome: Progressing   Problem: Health Behavior/Discharge Planning: Goal: Ability to manage health-related needs will improve Outcome: Progressing   Problem: Clinical Measurements: Goal: Ability to maintain clinical measurements within normal limits will improve Outcome: Progressing Goal: Will remain free from infection Outcome: Progressing Goal: Diagnostic test results will improve Outcome: Progressing Goal: Respiratory complications will improve Outcome: Progressing Goal: Cardiovascular complication will be avoided Outcome: Progressing   Problem: Coping: Goal: Level of anxiety will decrease Outcome: Progressing   Problem: Elimination: Goal: Will not experience complications related to bowel motility Outcome: Progressing Goal: Will not experience complications related to urinary retention Outcome: Progressing    Problem: Pain Managment: Goal: General experience of comfort will improve Outcome: Progressing   Problem: Skin Integrity: Goal: Risk for impaired skin integrity will decrease Outcome: Progressing   Problem: Intracerebral Hemorrhage Tissue Perfusion: Goal: Complications of Intracerebral Hemorrhage will be minimized Outcome: Progressing   Problem: Ischemic Stroke/TIA Tissue Perfusion: Goal: Complications of ischemic stroke/TIA will be minimized Outcome: Progressing   

## 2020-12-06 NOTE — Progress Notes (Signed)
[facility] PROGRESS NOTE  Charlene [name] IRS:[id number] DOB: Jun 24, 1966 DOA: 08/26/2020 PCP: Patient, [name] (Inactive)   HPI/brief narrative: 55 yo F with 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  Subsequently on 2/10, she developed obtundation and respiratory arrest leading to intubation.  She has remained persistently obtunded, now has a trach and a PEG.  Hospital course complicated by persistent fever, Serratia pneumonia and now pre-orbital cellulitis  As of 3/25 with adjustments in hypertonicity medications patient began to improve regarding hypertonicity.  Eventually started on provigil with improvements in alertness.  Evaluated by rehab physician who recommended increasing provigil dose further.  Subsequently patient has become more alert.  Most days she is able to track and with encouragement able to follow simple commands especially since partial obstruction glasses placed.  See progress note dictated 4/27 for expanded details.   2/09 Admit for [place] 2/10 EVD placed, later intubated due to obtundation and respiratory arrest 2/11 MRI Brain, hypertonic saline d/c'd, EVD not working.  Husband did not consent to PICC  2/13 EVD removed 2/17 [place] discussion with family, PMT 2/24 Bedside trach and PEG 3/1 Transferred to [facility]  Subjective: Today, patient was seen and examined at bedside.  Not much change noted.  Morning more frequently today.  Patient's son at bedside.  Objective: Vitals:   12/06/20 1138 12/06/20 1240  BP: (!) 150/93 122/73  Pulse: 85 74  Resp: 16   Temp:  98.3 F (36.8 C)  SpO2: 98% 100%    Intake/Output Summary (Last 24 hours) at 12/06/2020 1401 Last data filed at 12/06/2020 1244 Gross per 24 hour  Intake --  Output 1600 ml  Net -1600 ml   Filed Weights   11/22/20 0500 11/25/20 0346 12/03/20 0500  Weight: 76 kg 75.3 kg 50.8 kg    Physical exam: General: Average  built, somnolent, eyes spontaneously, not in obvious distress HENT:   No scleral pallor or icterus noted. Oral mucosa is moist.  Tracheostomy In Place Chest:  Clear breath sounds.  Diminished breath sounds bilaterally. No crackles or wheezes.  CVS: S1 &S2 heard. No murmur.  Regular rate and rhythm. Abdomen: Soft, nontender, nondistended.  Bowel sounds are heard.  PEG tube in place. Extremities: Contracted and atrophy of muscles Psych: Spontaneous eye movement, morning only CNS: Left facial droop, intermittent movement of the upper extremities, track with the right eye. Skin: Warm and dry.     Assessment/Plan:  Multiple acute strokes  Right Thalamus ICH with IVH  Obstructive Hydrocephalus/Acute on persistent metabolic Encephalopathy/brain injury related hypertonicity  Continue baclofen, Provigil.  Bowel regimen continue PT OT speech-language therapy.  Currently skilled nursing level of care.  Fever Improved.  On empiric meropenem at this time.  Likely secondary to UTI.  History of ESBL Enterococcus in the past.  On empiric meropenem.  Urine culture showing more than 100,000 colonies of Klebsiella pneumonia.  Incentive to Rocephin.  Will change to Rocephin for now.  Temperature max of 98.7 F today.  Chest x-ray was unremarkable.  COVID and influenza negative. COVID and influenza negative.  Blood cultures negative in 2 days.  Hypertensive emergency/Malignant HTN Patient was supposed to be on Coreg and HCTZ prior to admission but was not taking it.  Continue Norvasc Lopressor and hydralazine.  Acute hypoxemic respiratory failure secondary to CVA requiring mechanical ventilation/Trach dependent -Downsized to #4 cuffless trach on 5/8.  Continue as per trach team. Continue capping trials.  Dysphagia -Continue tube  feedings, Prosource liquid, free water.  Continue sliding scale insulin Accu-Cheks  Right upper extremity thrombophlebitis - [name] -> age indeterminate SVT of right cephalic vein.     Neck Contracture/torticollis -- Resolved  Bacterial Conjunctivitis /periorbital cellulitis -Resolved  Chronic pansinusitis/recurrent fevers 2/2 aspiration PNA;History of Serratia Pneumonia  Hospital Acquired Pneumonia:  -Completed several courses of antibiotic -Follow-up imaging including sinus CT consistent with chronic but stable and resolving sinusitis.  Currently on meropenem.   Anemia -Stable  Hyperlipidemia -Continue Lipitor   Status: Inpatient Remains inpatient appropriate because:Altered mental status, Unsafe d/c plan, IV treatments appropriate due to intensity of illness or inability to take PO and Inpatient level of care appropriate due to severity of illness   Dispo: The patient is from: Home              Anticipated d/c is to: SNF trach capable-[place] but [place] available              Patient currently is medically stable to d/c.   Difficult to place patient Yes  Code Status: Full code  Family Communication: Spoke with the patient's son at bedside  DVT prophylaxis: Off Lovenox  Data Reviewed: Basic Metabolic Panel: Recent Labs  Lab 12/04/20 1015  NA 138  K 4.0  CL 102  CO2 30  GLUCOSE 131*  BUN 17  CREATININE 0.54  CALCIUM 9.2   Liver Function Tests: Recent Labs  Lab 12/04/20 1015  AST 24  ALT 38  ALKPHOS 109  BILITOT 0.2*  PROT 7.1  ALBUMIN 3.0*   CBC: Recent Labs  Lab 12/04/20 1015  WBC 5.6  NEUTROABS 2.5  HGB 10.6*  HCT 33.9*  MCV 81.7  PLT 324    CBG: Recent Labs  Lab 12/05/20 2044 12/05/20 2309 12/06/20 0356 12/06/20 0719 12/06/20 1240  GLUCAP 122* 105* 130* 109* 147*     Studies: No results found.  Scheduled Meds: . amLODipine  10 mg Per Tube Daily  . atorvastatin  40 mg Per Tube Daily  . baclofen  7.5 mg Per Tube 2 times per day  . chlorhexidine gluconate (MEDLINE KIT)  15 mL Mouth Rinse BID  . Chlorhexidine Gluconate Cloth  6 each Topical Daily  . feeding supplement  (PROSource TF)  45 mL Per Tube Daily  . free water  100 mL Per Tube Q4H  . hydrALAZINE  50 mg Per Tube Q8H  . insulin aspart  0-9 Units Subcutaneous Q4H  . mouth rinse  15 mL Mouth Rinse 5 X Daily  . metoprolol tartrate  100 mg Per Tube BID  . modafinil  200 mg Per Tube Daily  . oxybutynin  5 mg Per Tube BID  . pantoprazole sodium  40 mg Per Tube BID  . polyethylene glycol  17 g Per Tube QODAY  . tiZANidine  2 mg Per Tube QHS   Continuous Infusions: . sodium chloride Stopped (10/26/20 1445)  . feeding supplement (OSMOLITE 1.2 CAL) 1,000 mL (12/06/20 0126)  . 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   Palliative care.    PCCM continues to follow.  No decannulation.   Procedures:  Tracheostomy  PEG tube  EEG  Echocardiogram  Antibiotics: Vancomycin 2/12-2/15 Maxipime 2/12-2/15 Ancef 2/19-2/27 Vancomycin 2/20 x 1 dose Maxipime 2/27- 3/5 Vancomycin 3/1-3/3 Ancef 3/6-3/14 Cipro 3/20 4-3/30 Unasyn 3/30-4/4 Rocephin 4/11-4/12 Meropenem 4/12-4/17  Micro Data:  2/9 COVID + Influenza negative 2/10 MRSA PCR negative 2/12 BCx 2 > no growth  2/19 BC 1/2 +Stap EPI, 1/2 no growth  10/07/2020 sputum positive for Serratia marcescens 4/8 Enterobacter cloaca + urine culture MDR sensitive only to imipenem and gentamicin   Antimicrobials:  Cefepime 2/12 > 2/15 Vanc 2/12 > 2/15 10/08/2020 ciprofloxacin>>3/30 Unasyn 3/30 >> 4/02 Rocephin 4/11 > 4/12 Meropenem 4/12>4/17   Flora Lipps,  MD [facility] 101  Days

## 2020-12-07 DIAGNOSIS — Z930 Tracheostomy status: Secondary | ICD-10-CM

## 2020-12-07 LAB — BASIC METABOLIC PANEL
Anion gap: 8 (ref 5–15)
BUN: 15 mg/dL (ref 6–20)
CO2: 31 mmol/L (ref 22–32)
Calcium: 9.6 mg/dL (ref 8.9–10.3)
Chloride: 100 mmol/L (ref 98–111)
Creatinine, Ser: 0.48 mg/dL (ref 0.44–1.00)
GFR, Estimated: >60 mL/min (ref >60)
Glucose, Bld: 115 mg/dL — ABNORMAL HIGH (ref 70–99)
Potassium: 4 mmol/L (ref 3.5–5.1)
Sodium: 139 mmol/L (ref 135–145)

## 2020-12-07 LAB — GLUCOSE, CAPILLARY
Glucose-Capillary: 113 mg/dL — ABNORMAL HIGH (ref 70–99)
Glucose-Capillary: 138 mg/dL — ABNORMAL HIGH (ref 70–99)
Glucose-Capillary: 131 mg/dL — ABNORMAL HIGH (ref 70–99)
Glucose-Capillary: 116 mg/dL — ABNORMAL HIGH (ref 70–99)
Glucose-Capillary: 116 mg/dL — ABNORMAL HIGH (ref 70–99)

## 2020-12-07 LAB — CBC
HCT: 33.5 % — ABNORMAL LOW (ref 36.0–46.0)
Hemoglobin: 10.5 g/dL — ABNORMAL LOW (ref 12.0–15.0)
MCH: 24.9 pg — ABNORMAL LOW (ref 26.0–34.0)
MCHC: 31.3 g/dL (ref 30.0–36.0)
MCV: 79.6 fL — ABNORMAL LOW (ref 80.0–100.0)
Platelets: 315 10*3/uL (ref 150–400)
RBC: 4.21 MIL/uL (ref 3.87–5.11)
RDW: 15 % (ref 11.5–15.5)
WBC: 5.7 10*3/uL (ref 4.0–10.5)
nRBC: 0 % (ref 0.0–0.2)

## 2020-12-07 LAB — MAGNESIUM: Magnesium: 2 mg/dL (ref 1.7–2.4)

## 2020-12-07 MED ORDER — POLYETHYLENE GLYCOL 3350 17 G PO PACK
17.0000 g | PACK | Freq: Every day | ORAL | Status: DC | PRN
  Administered 2020-12-10 – 2020-12-17 (×2): 17 g
  Filled 2020-12-07 (×2): qty 1

## 2020-12-07 NOTE — TOC Transition Note (Addendum)
Transition of Care [facility]) - CM/SW Discharge Note   Patient Details  Name: Charlene [name] MRN: [id number] Date of Birth: Apr 07, 1966  Transition [facility]) CM/SW Contact:  Janae Bridgeman, RN Phone Number: 12/07/2020, 9:07 AM   Clinical Narrative:    Case management left a message with Alison Murray, patient's husband, to follow up regarding processing of patient's Medicaid application with Nicholas Lose at [phone].  CM and MSW to follow patient concerning transitions of care needs for disposition and SNf placement.   Final next level of care: Skilled Nursing Facility Barriers to Discharge: Continued Medical Work up   Patient Goals and CMS Choice Patient states their goals for this hospitalization and ongoing recovery are:: Patient unable to care for patient in the home and are aware that patient will need rehab placement. CMS Medicare.gov Compare Post Acute Care list provided to:: Patient Represenative (must comment) (daughter, [name]) Choice offered to / list presented to : [facility] POA / Guardian  Discharge Placement                       Discharge Plan and Services In-house Referral: Clinical Social Work Discharge Planning Services: CM Consult Post Acute Care Choice: Skilled Nursing Facility                               Social Determinants of Health (SDOH) Interventions     Readmission Risk Interventions Readmission Risk Prevention Plan 09/29/2020  Transportation Screening Complete  PCP or Specialist Appt within 3-5 Days Complete  HRI or Home Care Consult Complete  Social Work Consult for Recovery Care Planning/Counseling Complete  Palliative Care Screening Complete  Medication Review Oceanographer) Complete  Some recent data might be hidden

## 2020-12-07 NOTE — Progress Notes (Signed)
NAME:  Charlene [name], MRN:  [id number], DOB:  [birth year], LOS: 102 ADMISSION DATE:  08/26/2020, CONSULTATION DATE:  2/10 REFERRING MD:  Roda Shutters, CHIEF COMPLAINT:  headache   History of Present Illness:  55 year old female admitted with hypertensive R thalamic/basal ganglia ICH. Resultant obstructive hydrocephalus prompting EVD placement 2/10. Subsequently, 2/10 she had acute mental status change to obtundation and respiratory arrest resulting in intubation. Trach and Peg 2/24. Trach change to #6 cuffless on 3/11.   Pertinent  Medical History  Hypertension Medication non-compliance  Significant Hospital Events: Including procedures, antibiotic start and stop dates in addition to other pertinent events   2/09 Admit for ICH 2/10 EVD placed, later intubated due to obtundation and respiratory arrest 2/11 MRI Brain, hypertonic saline d/c'd, EVD not working.  Husband did not consent to PICC  2/13 EVD removed 2/17 [place] discussion with family, PMT 3/11 trach changed to [place] 6 uncuffed 4/18 28% ATC / 5L  5/8 downsized trach to 4 5/16 tolerating z 4 well. No real functional improvements since 5/8  ETT 2/10 >> 09/10/2020 EVD 2/10 > non functioning 2/11 at 1500; clamped 2/12 am > 2/13 Trach (Dr. Denese Killings) 2/24 >>  PEG placement (Dr. Sheliah Hatch) 2/24 >>  CT Head 2/9 >> Acute hemorrhage with the epicenter in the right thalamus. Measurement is approximately 3.2 x 2.1 x 2.5 cm (volume 8.8 cm^3). Intraventricular penetration with blood filling the third and fourth ventricles and nearly filling the right lateral ventricle. No hydrocephalus. CTA Head/Neck 2/10 >> Unchanged size of intraparenchymal hemorrhage centered in the right thalamus and basal ganglia with extension into the right lateral ventricle. 2. No intracranial arterial occlusion or high-grade stenosis. No dissection, aneurysm or hemodynamically significant stenosis of the carotid or vertebral arteries. CT Head 2/10 >> Stable parenchymal  hemorrhage in the right thalamus and basal ganglia with intraventricular extension as described. No new focal abnormality is noted. CT Head 2/10 (post intubation) >> redemonstrated intraparenchymal hemorrhage centered within the R thalamus/basal ganglia extending into the cerebral peduncle/midbrain with associated intraventricular extension and hemorrhage in the R lateral ventricle and fourth ventricle, overall not significantly changed from prior; stable mass effect, 20mm of R to L midline shift, postsurgical changes of ventriculostomy TTE 2/10 >> LVEF 60 to 65%, LV has normal function, no regional wall motion abnormalities, mild concentric LVH.  RV systolic function is normal, normal pulmonary artery systolic pressure.  MRA head 2/11 >>Negative intracranial MR angiography. No evidence of aneurysm or high flow vascular malformation to explain the intraparenchymal hemorrhage  MRI brain 2/11 >> Similar intraparenchymal hemorrhage centered within the right thalamus and basal ganglia extending into the right midbrain with intraventricular extension, effacement of the prepontine cistern and similar 4 mm of leftward midline shift; separate areas of restricted diffusion and edema involving the R > L mesial temporal lobes/parahippocampal cortex, bilateral basal ganglia, and possibly the upper pons that are c/f acute infarcts, most likely secondary to mass effect from hemorrhage. MRI brain 2/19 >> intraventricular clot has decreased in size but mid-brain parenchymal clot has not and there are more ischemic infarcts in the surrounding brain.  CT Head WO Contrast 09/07/20>> Expected evolution of intracranial hemorrhage since 08/30/2020 head CT. Multiple areas of low attenuation corresponding to some of the infarcts seen on prior MRI. Ventricle caliber similar to prior MRI  Interim History / Subjective:  No sig change   Objective   Blood pressure 121/71, pulse 85, temperature 98.7 F (37.1 C), temperature source  Oral, resp. rate 17, height 5'  5" (1.651 m), weight 50.8 kg, last menstrual period 03/03/2014, SpO2 100 %, unknown if currently breastfeeding.    FiO2 (%):  [21 %] 21 %   Intake/Output Summary (Last 24 hours) at 12/07/2020 1731 Last data filed at 12/07/2020 1702 Gross per 24 hour  Intake 1743.92 ml  Output 1000 ml  Net 743.92 ml   Filed Weights   11/22/20 0500 11/25/20 0346 12/03/20 0500  Weight: 76 kg 75.3 kg 50.8 kg    Examination:  General this is deconditioned contracted minimally responsive female HENT 4 trach uncuffed. Capped.  pulm scattered rhonchi no accessory use Card rrr abd soft peg in place Ext warm and dry  Neuro awake w/ voice. Not following commands   Labs/imaging that I havepersonally reviewed  (right click and "Reselect all SmartList Selections" daily)  WBC today normal Hgb 10.5 Cr OK  Resolved Hospital Problem list     Assessment & Plan:  Tracheostomy dependence post CVA Severe deconditioning Muscle wasting Poor cough mechanics  Discussion She was started on capping trials per primary team. She has done well with this HOWEVER I have not really seen any significant functional change over the course of her stay. From a capping trial stand-point she doesn't need the trach right now BUT I think it would take very little for her to lose ground. I do NOT think she is a good candidate for decannulation because of aspiration risk and cough mechanics. I understand the placement concerns here. The best option would be for her family to take her home with trach.   Plan Cont routine trach care I Still DO NOT think she should EVER be decannulated.  We will cont to follow on weekly basis   Best practice (right click and "Reselect all SmartList Selections" daily)  Per primary   Simonne Martinet ACNP-BC [facility] Pulmonary/Critical Care Pager # [phone] OR # [phone] if no answer

## 2020-12-07 NOTE — Plan of Care (Signed)
  Problem: Education: Goal: Knowledge of disease or condition will improve 12/07/2020 1704 by Melvenia Needles, RN Outcome: Progressing 12/07/2020 1539 by Melvenia Needles, RN Outcome: Progressing Goal: Knowledge of patient specific risk factors addressed and post discharge goals established will improve 12/07/2020 1704 by Melvenia Needles, RN Outcome: Progressing 12/07/2020 1539 by Melvenia Needles, RN Outcome: Progressing   Problem: Intracerebral Hemorrhage Tissue Perfusion: Goal: Complications of Intracerebral Hemorrhage will be minimized 12/07/2020 1704 by Melvenia Needles, RN Outcome: Progressing 12/07/2020 1539 by Melvenia Needles, RN Outcome: Progressing   Problem: Ischemic Stroke/TIA Tissue Perfusion: Goal: Complications of ischemic stroke/TIA will be minimized 12/07/2020 1704 by Melvenia Needles, RN Outcome: Progressing 12/07/2020 1539 by Melvenia Needles, RN Outcome: Progressing

## 2020-12-07 NOTE — Progress Notes (Addendum)
[facility] PROGRESS NOTE  Charlene [name] QZE:[id number] DOB: March 21, 1966 DOA: 08/26/2020 PCP: Patient, [name] (Inactive)    October 08, 2020     10/16/2020                        10/16/2020     10/18/2020                        10/20/2020    Status: Remains inpatient appropriate because:Altered mental status, Unsafe d/c plan, IV treatments appropriate due to intensity of illness or inability to take PO and Inpatient level of care appropriate due to severity of illness   Dispo: The patient is from: Home              Anticipated d/c is to: SNF trach capable-[place] but no [place]-mental status remains variable and as of 5/23 unable to decannulate per trach team evaluation.              Patient currently is medically stable to d/c.   Difficult to place patient Yes   Level of care: Telemetry Medical  Code Status: Full Family Communication: husband Charlene [name] [phone]) is her POA and legal contact.  Charlene [name] was at bedside on 5/18 and given update DVT prophylaxis: Chronically bedbound.  5/6: We will discontinue prophylactic Lovenox   Vaccination status: Unvaccinated-have discussed both with patient's mother and with husband Charlene [name].  He wishes to speak to the rest of the family before making a decision but is leaning towards [name] vaccine.  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  Subsequently on 2/10, she developed obtundation  and respiratory arrest leading to intubation.  She has remained persistently obtunded, now has a trach and a PEG.  Hospital course complicated by persistent fever, Serratia pneumonia and now pre-orbital cellulitis  As of 3/25 with adjustments in hypertonicity medications patient began to improve regarding hypertonicity.  Eventually started on provigil with improvements in alertness.  Evaluated by rehab physician who recommended increasing provigil dose further.  Subsequently patient has become more alert.  Most days she is able to track and with encouragement able to follow simple commands especially since partial obstruction glasses placed.  See progress note dictated 4/27 for expanded details.   2/09 Admit for [place] 2/10 EVD placed, later intubated due to obtundation and respiratory arrest 2/11 MRI Brain, hypertonic saline d/c'd, EVD not working.  Husband did not consent to PICC  2/13 EVD removed 2/17 [place] discussion with family, PMT 2/24 Bedside trach and PEG 3/1 Transferred to [facility]  Subjective: Patient very alert.  Both eyes open and able to track with both eyes.  She is making humming noises that appear to be consistent with her attempted to sing to the music playing in the room.  She was able to follow simple commands and track past the midline with the right eye today.  Objective: Vitals:   12/06/20 2339  12/07/20 0416  BP: 122/85 118/72  Pulse: 84 70  Resp: 19 18  Temp: 98.6 F (37 C) 98 F (36.7 C)  SpO2: 100% 98%    Intake/Output Summary (Last 24 hours) at 12/07/2020 0807 Last data filed at 12/07/2020 0500 Gross per 24 hour  Intake 660 ml  Output 1500 ml  Net -840 ml   Filed Weights   11/22/20 0500 11/25/20 0346 12/03/20 0500  Weight: 76 kg 75.3 kg 50.8 kg    Exam:  Constitutional: Alert, calm, more interactive today Respiratory: #4.0 cuffless trach, trach collar with FiO2 21% at 5L, PMV in place.  Managing oral secretions, nursing staff reports spontaneous cough.  No  tracheal secretions.  Lungs are clear Cardiovascular: Normal heart sounds, nontachycardic pulse, no peripheral edema Abdomen:  PEG tube -LBM 5/19, normoactive bowel sounds, abdomen not distended, mother reports increased frequency of liquid stools Musculoskeletal: Bilateral atrophy of thigh and calf muscles Neurologic: Known left facial droop, intermittent spontaneous movement of upper extremities especially RUE.  Wracking with both eyes more consistently with right eyes.  Disconjugate gaze improved although still has somewhat of a downward gaze with left eye. Psych: Awake.  Incomprehensible grunting/humming is to be consistent with attempting to sing to music playing in the room  Assessment/Plan: Acute problems: Multiple acute strokes  Right [place] ICH with IVH  Obstructive Hydrocephalus/Acute on persistent metabolic Encephalopathy/brain injury related hypertonicity -Continue baclofen and Provigil -Continue bowel regimen with low-dose laxatives daily -Continue PT/OT/SLP -5/2, CIR MD reevaluated patient " Charlene [name] unfortunately has not made any meaningful functional gains over the last month. She remains most appropriate for SNF level care".   Klebsiella UTI -Resistant to nitrofurantoin and ampicillin -Meropenem transitioned to Rocephin on 5/22 -Previously patient developed UTI while pure wick urinary collection device in place.  To minimize risk of recurrent UTI have asked that pure wick only be used at bedtime on a 10 PM to 8 AM schedule -Blood cultures negative thus far  Recurrent diarrhea -[place]manation of scheduled laxatives every other day and recent antibiotics -We will change MiraLAX to daily as needed for now and monitor  Hypertensive emergency/Malignant HTN -Due to financial constraints patient was not taking carvedilol and hydrochlorothiazide prior to admission -Continue Norvasc, Lopressor and hydralazine.   Acute hypoxemic respiratory failure secondary to CVA requiring  mechanical ventilation (Resolved) Trach dependent -Downsized to #4 cuffless trach on 5/8 -Per trach team: Has significant altered mentation and inability to manage oral secretions at present time is not a safe candidate for decannulation.  Discussed this with family as major obstacle in discharging to home.  This will also decrease options for SNF placement. -5/9: Reevaluated by trach team.  Again they reiterate patient is not a good candidate for decannulation "unless the family is prepared to transition to comfort should she deteriorate.".  PCCM will continue to follow weekly -5/23 despite tolerating capping trials for 48 hours trach team has reevaluated this patient and have determined that she is profoundly weak, minimally responsive and from their standpoint confers a high risk of death from aspiration.  If CODE STATUS is DNR and family otherwise would not wish to have patient reintubated for respiratory failure after decannulation they would consider decannulating her (see above regarding evaluation on 5/9)  Dysphagia -Continue tube feedings, Prosource liquid, free water -Follow CBGs and provide SSI      Other problems: Right upper extremity thrombophlebitis - [name] -> age indeterminate SVT of right cephalic vein  Neck Contracture/torticollis -- Resolved  ESBL positive  Enterobacter UTI -Has completed 5 days of meropenem -Contact isolation during hospitalization -continue Oxybutynin suspected bladder spasms  Bacterial Conjunctivitis /periorbital cellulitis -Resolved  Chronic pansinusitis/recurrent fevers 2/2 aspiration PNA;History of Serratia Pneumonia  Hospital Acquired Pneumonia:  -Completed several courses of antibiotic -Follow-up imaging including sinus CT consistent with chronic but stable and resolving sinusitis   Anemia -Stable  HLD -Continue Lipitor      Data Reviewed: Basic Metabolic Panel: Recent Labs  Lab 12/04/20 1015 12/07/20 0451  NA 138 139  K 4.0  4.0  CL 102 100  CO2 30 31  GLUCOSE 131* 115*  BUN 17 15  CREATININE 0.54 0.48  CALCIUM 9.2 9.6  MG  --  2.0   Liver Function Tests: Recent Labs  Lab 12/04/20 1015  AST 24  ALT 38  ALKPHOS 109  BILITOT 0.2*  PROT 7.1  ALBUMIN 3.0*   CBC: Recent Labs  Lab 12/04/20 1015 12/07/20 0451  WBC 5.6 5.7  NEUTROABS 2.5  --   HGB 10.6* 10.5*  HCT 33.9* 33.5*  MCV 81.7 79.6*  PLT 324 315    CBG: Recent Labs  Lab 12/06/20 1240 12/06/20 1640 12/06/20 1953 12/06/20 2335 12/07/20 0417  GLUCAP 147* 95 121* 125* 113*     Studies: No results found.  Scheduled Meds: . amLODipine  10 mg Per Tube Daily  . atorvastatin  40 mg Per Tube Daily  . baclofen  7.5 mg Per Tube 2 times per day  . chlorhexidine gluconate (MEDLINE KIT)  15 mL Mouth Rinse BID  . Chlorhexidine Gluconate Cloth  6 each Topical Daily  . feeding supplement (PROSource TF)  45 mL Per Tube Daily  . free water  100 mL Per Tube Q4H  . hydrALAZINE  50 mg Per Tube Q8H  . insulin aspart  0-9 Units Subcutaneous Q4H  . mouth rinse  15 mL Mouth Rinse 5 X Daily  . metoprolol tartrate  100 mg Per Tube BID  . modafinil  200 mg Per Tube Daily  . oxybutynin  5 mg Per Tube BID  . pantoprazole sodium  40 mg Per Tube BID  . polyethylene glycol  17 g Per Tube QODAY  . tiZANidine  2 mg Per Tube QHS   Continuous Infusions: . sodium chloride Stopped (10/26/20 1445)  . cefTRIAXone (ROCEPHIN)  IV 1 g (12/06/20 1559)  . 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   Palliative care.    PCCM continues to follow.  No decannulation.   Procedures:  Tracheostomy  PEG tube  EEG  Echocardiogram  Antibiotics: Vancomycin 2/12-2/15 Maxipime 2/12-2/15 Ancef 2/19-2/27 Vancomycin 2/20 x 1 dose Maxipime 2/27- 3/5 Vancomycin 3/1-3/3 Ancef 3/6-3/14 Cipro 3/20 4-3/30 Unasyn 3/30-4/4 Rocephin 4/11-4/12 Meropenem 4/12-4/17   Time spent: 25 minutes    Erin Hearing ANP  [facility] 7 am - 330 pm/M-F for direct patient care and secure chat Please refer to [place] for contact info 102  Days

## 2020-12-07 NOTE — Plan of Care (Signed)
  Problem: Coping: Goal: Will identify appropriate support needs Outcome: Progressing   Problem: Intracerebral Hemorrhage Tissue Perfusion: Goal: Complications of Intracerebral Hemorrhage will be minimized Outcome: Progressing   Problem: Intracerebral Hemorrhage Tissue Perfusion: Goal: Complications of Intracerebral Hemorrhage will be minimized Outcome: Progressing   Problem: Ischemic Stroke/TIA Tissue Perfusion: Goal: Complications of ischemic stroke/TIA will be minimized Outcome: Progressing

## 2020-12-07 NOTE — Progress Notes (Signed)
RT NOTES: Trach cap removed per MD. Placed on 21% ATC.

## 2020-12-07 NOTE — Plan of Care (Signed)
  Problem: Education: Goal: Knowledge of disease or condition will improve Outcome: Progressing Goal: Knowledge of patient specific risk factors addressed and post discharge goals established will improve Outcome: Progressing   Problem: Coping: Goal: Will identify appropriate support needs Outcome: Progressing   Problem: Health Behavior/Discharge Planning: Goal: Ability to manage health-related needs will improve Outcome: Progressing   Problem: Self-Care: Goal: Verbalization of feelings and concerns over difficulty with self-care will improve Outcome: Progressing Goal: Ability to communicate needs accurately will improve Outcome: Progressing   Problem: Nutrition: Goal: Risk of aspiration will decrease Outcome: Progressing   Problem: Intracerebral Hemorrhage Tissue Perfusion: Goal: Complications of Intracerebral Hemorrhage will be minimized Outcome: Progressing   Problem: Education: Goal: Knowledge of General Education information will improve Description: Including pain rating scale, medication(s)/side effects and non-pharmacologic comfort measures Outcome: Progressing   Problem: Health Behavior/Discharge Planning: Goal: Ability to manage health-related needs will improve Outcome: Progressing   Problem: Clinical Measurements: Goal: Ability to maintain clinical measurements within normal limits will improve Outcome: Progressing Goal: Will remain free from infection Outcome: Progressing Goal: Diagnostic test results will improve Outcome: Progressing Goal: Respiratory complications will improve Outcome: Progressing Goal: Cardiovascular complication will be avoided Outcome: Progressing   Problem: Coping: Goal: Level of anxiety will decrease Outcome: Progressing   Problem: Elimination: Goal: Will not experience complications related to bowel motility Outcome: Progressing Goal: Will not experience complications related to urinary retention Outcome: Progressing    Problem: Pain Managment: Goal: General experience of comfort will improve Outcome: Progressing   Problem: Skin Integrity: Goal: Risk for impaired skin integrity will decrease Outcome: Progressing   Problem: Intracerebral Hemorrhage Tissue Perfusion: Goal: Complications of Intracerebral Hemorrhage will be minimized Outcome: Progressing   Problem: Ischemic Stroke/TIA Tissue Perfusion: Goal: Complications of ischemic stroke/TIA will be minimized Outcome: Progressing   

## 2020-12-07 NOTE — NC FL2 (Signed)
[place] LEVEL OF CARE SCREENING TOOL     IDENTIFICATION  Patient Name: Charlene [name] Birthdate: 04/28/1966 Sex: female Admission Date (Current Location): 08/26/2020  [place] and [place] Number:  Kathleen Argue Medicaid potential - pending #[id number] [place] and Address:  [facility], [place] [address], [place], [place] [address]      Provider Number: [id number]  Attending Physician Name and Address:  Flora Lipps, MD  Relative Name and Phone Number:  [name], husband - [phone]    Current Level of Care: Hospital Recommended Level of Care: [place] Prior Approval Number:    Date Approved/Denied:   PASRR Number: [id number]  Discharge Plan: SNF    Current Diagnoses: Patient Active Problem List   Diagnosis Date Noted  . Acute cystitis without hematuria   . 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 Aspiration pneumonia of right lower lobe ([place])   . Poor prognosis 10/08/2020  . Muscle hypertonicity   . Oropharyngeal dysphagia   . Palliative care by specialist   . Status post tracheostomy ([place])   . ICH (intracerebral hemorrhage) ([place]) 08/27/2020  . Acute hypoxemic respiratory failure ([place])   . Anxiety state, unspecified 03/11/2014  . Essential hypertension 03/11/2014  . Essential hypertension, benign 02/28/2014  . Accelerated hypertension 02/21/2014  . Nausea & vomiting 02/21/2014  . Headache 02/21/2014  . Chest pain 02/21/2014    Orientation RESPIRATION BLADDER Height & Weight      (Patient nonverbal - unable to assess)  Tracheostomy (Anticipating trach removal within next 24-48 hours pending clinical stability and evaluation by trachestomy team) Incontinent Weight: 111 lb 15.9 oz (50.8 kg) (new bed) Height:  '5\' 5"'  (165.1 cm)  BEHAVIORAL SYMPTOMS/MOOD NEUROLOGICAL BOWEL NUTRITION STATUS      Incontinent Feeding tube (PEG, continous feeds)   AMBULATORY STATUS COMMUNICATION OF NEEDS Skin   Total Care Does not communicate Normal                       Personal Care Assistance Level of Assistance  Bathing,Feeding,Dressing,Total care Bathing Assistance: Maximum assistance Feeding assistance: Maximum assistance Dressing Assistance: Maximum assistance Total Care Assistance: Maximum assistance   Functional Limitations Info  Sight,Hearing,Speech Sight Info: Impaired (Patient eyes are open, inconsistent tracking) Hearing Info: Adequate Speech Info: Impaired    SPECIAL CARE FACTORS FREQUENCY  PT (By licensed PT),OT (By licensed OT)     PT Frequency: 5x weekly OT Frequency: 5x weekly     Speech Therapy Frequency: 5x weekly      Contractures Contractures Info: Not present    Additional Factors Info  Code Status,Allergies,Isolation Precautions Code Status Info: Full Code Allergies Info: No known allergies     Isolation Precautions Info: Contact - ESBL     Current Medications (12/07/2020):  This is the current hospital active medication list Current Facility-Administered Medications  Medication Dose Route Frequency Provider Last Rate Last Admin  . 0.9 %  sodium chloride infusion   Intravenous PRN Laurin Coder, MD   Stopped at 10/26/20 1445  . acetaminophen (TYLENOL) tablet 650 mg  650 mg Oral Q4H PRN Amie Portland, MD   650 mg at 11/24/20 2207   Or  . acetaminophen (TYLENOL) 160 MG/5ML solution 650 mg  650 mg Per Tube Q4H PRN Amie [place], MD   650 mg at 12/06/20 1715   Or  . acetaminophen (TYLENOL) suppository 650 mg  650 mg Rectal Q4H PRN Amie [place],  MD   650 mg at 10/22/20 0008  . amLODipine (NORVASC) tablet 10 mg  10 mg Per Tube Daily Danford, Suann Larry, MD   10 mg at 12/07/20 1035  . atorvastatin (LIPITOR) tablet 40 mg  40 mg Per Tube Daily Elodia Florence., MD   40 mg at 12/07/20 1035  . baclofen (OZOBAX) 1 mg/mL oral solution 7.5 mg  7.5 mg Per Tube 2 times per day Samella Parr, NP    7.5 mg at 12/07/20 0754  . cefTRIAXone (ROCEPHIN) 1 g in sodium chloride 0.9 % 100 mL IVPB  1 g Intravenous Q24H Pokhrel, Laxman, MD 200 mL/hr at 12/06/20 1559 1 g at 12/06/20 1559  . chlorhexidine gluconate (MEDLINE KIT) (PERIDEX) 0.12 % solution 15 mL  15 mL Mouth Rinse BID Rosalin Hawking, MD   15 mL at 12/07/20 0753  . Chlorhexidine Gluconate Cloth 2 % PADS 6 each  6 each Topical Daily Amie [place], MD   6 each at 12/07/20 1036  . feeding supplement (OSMOLITE 1.2 CAL) liquid 1,000 mL  1,000 mL Per Tube Continuous Garvin Fila, MD 55 mL/hr at 12/06/20 2241 1,000 mL at 12/06/20 2241  . feeding supplement (PROSource TF) liquid 45 mL  45 mL Per Tube Daily Garvin Fila, MD   45 mL at 12/07/20 1036  . free water 100 mL  100 mL Per Tube Q4H Rosalin Hawking, MD   100 mL at 12/07/20 0754  . hydrALAZINE (APRESOLINE) tablet 50 mg  50 mg Per Tube Q8H Elodia Florence., MD   50 mg at 12/07/20 8413  . insulin aspart (novoLOG) injection 0-9 Units  0-9 Units Subcutaneous Q4H Jennelle Human B, NP   1 Units at 12/07/20 0825  . MEDLINE mouth rinse  15 mL Mouth Rinse 5 X Daily Nita Sells, MD   15 mL at 12/07/20 1036  . metoprolol tartrate (LOPRESSOR) 25 mg/10 mL oral suspension 100 mg  100 mg Per Tube BID Samella Parr, NP   100 mg at 12/07/20 1035  . modafinil (PROVIGIL) tablet 200 mg  200 mg Per Tube Daily Meredith Staggers, MD   200 mg at 12/07/20 0608  . oxybutynin (DITROPAN) 5 MG/5ML syrup 5 mg  5 mg Per Tube BID Caren Griffins, MD   5 mg at 12/07/20 1035  . oxyCODONE (Oxy IR/ROXICODONE) immediate release tablet 5 mg  5 mg Per Tube Q6H PRN Nita Sells, MD   5 mg at 12/01/20 0024  . pantoprazole sodium (PROTONIX) 40 mg/20 mL oral suspension 40 mg  40 mg Per Tube BID Jennelle Human B, NP   40 mg at 12/07/20 1040  . polyethylene glycol (MIRALAX / GLYCOLAX) packet 17 g  17 g Per Tube Daily PRN Samella Parr, NP      . senna-docusate (Senokot-S) tablet 1 tablet  1 tablet Per Tube BID  PRN Kipp Brood, MD   1 tablet at 09/28/20 0637  . tiZANidine (ZANAFLEX) tablet 2 mg  2 mg Per Tube QHS Debbe Odea, MD   2 mg at 12/06/20 2120     Discharge Medications: Please see discharge summary for a list of discharge medications.  Relevant Imaging Results:  Relevant Lab Results:   Additional Information SSN: [id number]  Archie Endo, LCSW

## 2020-12-07 NOTE — Progress Notes (Signed)
NAME:  Charlene [name], MRN:  [id number], DOB:  09-27-1965, LOS: 102 ADMISSION DATE:  08/26/2020, CONSULTATION DATE:  2/10 REFERRING MD:  Roda Shutters, CHIEF COMPLAINT:  headache   History of Present Illness:  55 year old female admitted with hypertensive R thalamic/basal ganglia ICH. Resultant obstructive hydrocephalus prompting EVD placement 2/10. Subsequently, 2/10 she had acute mental status change to obtundation and respiratory arrest resulting in intubation. Trach and Peg 2/24. Trach change to #6 cuffless on 3/11.   Pertinent  Medical History  Hypertension Medication non-compliance  Significant Hospital Events: Including procedures, antibiotic start and stop dates in addition to other pertinent events   2/09 Admit for ICH 2/10 EVD placed, later intubated due to obtundation and respiratory arrest 2/11 MRI Brain, hypertonic saline d/c'd, EVD not working.  Husband did not consent to PICC  2/13 EVD removed 2/17 GOC discussion with family, PMT 3/11 trach changed to Shiley 6 uncuffed 4/18 28% ATC / 5L  5/8 downsized trach to 4 5/16 tolerating z 4 well. No real functional improvements since 5/8  ETT 2/10 >> 09/10/2020 EVD 2/10 > non functioning 2/11 at 1500; clamped 2/12 am > 2/13 Trach (Dr. Denese Killings) 2/24 >>  PEG placement (Dr. Sheliah Hatch) 2/24 >>  CT Head 2/9 >> Acute hemorrhage with the epicenter in the right thalamus. Measurement is approximately 3.2 x 2.1 x 2.5 cm (volume 8.8 cm^3). Intraventricular penetration with blood filling the third and fourth ventricles and nearly filling the right lateral ventricle. No hydrocephalus. CTA Head/Neck 2/10 >> Unchanged size of intraparenchymal hemorrhage centered in the right thalamus and basal ganglia with extension into the right lateral ventricle. 2. No intracranial arterial occlusion or high-grade stenosis. No dissection, aneurysm or hemodynamically significant stenosis of the carotid or vertebral arteries. CT Head 2/10 >> Stable parenchymal  hemorrhage in the right thalamus and basal ganglia with intraventricular extension as described. No new focal abnormality is noted. CT Head 2/10 (post intubation) >> redemonstrated intraparenchymal hemorrhage centered within the R thalamus/basal ganglia extending into the cerebral peduncle/midbrain with associated intraventricular extension and hemorrhage in the R lateral ventricle and fourth ventricle, overall not significantly changed from prior; stable mass effect, 32mm of R to L midline shift, postsurgical changes of ventriculostomy TTE 2/10 >> LVEF 60 to 65%, LV has normal function, no regional wall motion abnormalities, mild concentric LVH.  RV systolic function is normal, normal pulmonary artery systolic pressure.  MRA head 2/11 >>Negative intracranial MR angiography. No evidence of aneurysm or high flow vascular malformation to explain the intraparenchymal hemorrhage  MRI brain 2/11 >> Similar intraparenchymal hemorrhage centered within the right thalamus and basal ganglia extending into the right midbrain with intraventricular extension, effacement of the prepontine cistern and similar 4 mm of leftward midline shift; separate areas of restricted diffusion and edema involving the R > L mesial temporal lobes/parahippocampal cortex, bilateral basal ganglia, and possibly the upper pons that are c/f acute infarcts, most likely secondary to mass effect from hemorrhage. MRI brain 2/19 >> intraventricular clot has decreased in size but mid-brain parenchymal clot has not and there are more ischemic infarcts in the surrounding brain.  CT Head WO Contrast 09/07/20>> Expected evolution of intracranial hemorrhage since 08/30/2020 head CT. Multiple areas of low attenuation corresponding to some of the infarcts seen on prior MRI. Ventricle caliber similar to prior MRI  Interim History / Subjective:  [name] has been capped since Friday No acute events  Objective   Blood pressure 137/87, pulse 80, temperature 98.7  F (37.1 C), resp. rate 18,  height 5\' 5"  (1.651 m), weight 50.8 kg, last menstrual period 03/03/2014, SpO2 100 %, unknown if currently breastfeeding.        Intake/Output Summary (Last 24 hours) at 12/07/2020 1323 Last data filed at 12/07/2020 0500 Gross per 24 hour  Intake 660 ml  Output 1000 ml  Net -340 ml   Filed Weights   11/22/20 0500 11/25/20 0346 12/03/20 0500  Weight: 76 kg 75.3 kg 50.8 kg    Examination:  General:  Chronically ill appearing, resting comfortably in bed HENT: NCAT tracheostomy site clean, dry, intact PULM: CTA B, normal effort CV: RRR, no mgr GI: BS+, soft, nontender MSK: diminished bulk and tone Neuro: Awake, tracks with eyes, minimal movements   Labs/imaging that I havepersonally reviewed  (right click and "Reselect all SmartList Selections" daily)  WBC today normal Hgb 10.5 Cr OK  Resolved Hospital Problem list     Assessment & Plan:  Tracheostomy dependence post CVA: currently she is profoundly weak, minimally responsive; from my standpoint I believe that her risk of death from aspiration remains high Passed 48 hour capping trial which is somewhat re-assuring, but the risk of aspiration remains high > would not decannulate unless she has a significant improvement > back to speaking valve/open during the daytime > would consider decannulating her if Code status is DNR > rest of trach care per routine  Will see weekly  Best practice (right click and "Reselect all SmartList Selections" daily)   Per primary  Labs   CBC: Recent Labs  Lab 12/04/20 1015 12/07/20 0451  WBC 5.6 5.7  NEUTROABS 2.5  --   HGB 10.6* 10.5*  HCT 33.9* 33.5*  MCV 81.7 79.6*  PLT 324 315    Basic Metabolic Panel: Recent Labs  Lab 12/04/20 1015 12/07/20 0451  NA 138 139  K 4.0 4.0  CL 102 100  CO2 30 31  GLUCOSE 131* 115*  BUN 17 15  CREATININE 0.54 0.48  CALCIUM 9.2 9.6  MG  --  2.0   GFR: Estimated Creatinine Clearance: 65.2 mL/min (by C-G  formula based on SCr of 0.48 mg/dL). Recent Labs  Lab 12/04/20 1015 12/07/20 0451  WBC 5.6 5.7    Liver Function Tests: Recent Labs  Lab 12/04/20 1015  AST 24  ALT 38  ALKPHOS 109  BILITOT 0.2*  PROT 7.1  ALBUMIN 3.0*   No results for input(s): LIPASE, AMYLASE in the last 168 hours. No results for input(s): AMMONIA in the last 168 hours.  ABG    Component Value Date/Time   PHART 7.558 (H) 08/27/2020 2020   PCO2ART 35.5 08/27/2020 2020   PO2ART 611 (H) 08/27/2020 2020   HCO3 31.7 (H) 08/27/2020 2020   TCO2 33 (H) 08/27/2020 2020   O2SAT 100.0 08/27/2020 2020     Coagulation Profile: No results for input(s): INR, PROTIME in the last 168 hours.  Cardiac Enzymes: No results for input(s): CKTOTAL, CKMB, CKMBINDEX, TROPONINI in the last 168 hours.  HbA1C: Hgb A1c MFr Bld  Date/Time Value Ref Range Status  08/27/2020 02:51 AM 6.0 (H) 4.8 - 5.6 % Final    Comment:    (NOTE) Pre diabetes:          5.7%-6.4%  Diabetes:              >6.4%  Glycemic control for   <7.0% adults with diabetes     CBG: Recent Labs  Lab 12/06/20 1953 12/06/20 2335 12/07/20 0417 12/07/20 0809 12/07/20 1240  GLUCAP 121*  125* 113* 138* 131*     Critical care time: n/a    Heber [place], MD [place] [facility] Pager: [phone] Cell: [phone] If no response, please call [phone] until 7pm After 7:00 pm call Elink  [phone]

## 2020-12-08 LAB — GLUCOSE, CAPILLARY
Glucose-Capillary: 100 mg/dL — ABNORMAL HIGH (ref 70–99)
Glucose-Capillary: 110 mg/dL — ABNORMAL HIGH (ref 70–99)
Glucose-Capillary: 126 mg/dL — ABNORMAL HIGH (ref 70–99)
Glucose-Capillary: 126 mg/dL — ABNORMAL HIGH (ref 70–99)
Glucose-Capillary: 136 mg/dL — ABNORMAL HIGH (ref 70–99)
Glucose-Capillary: 99 mg/dL (ref 70–99)

## 2020-12-08 MED ORDER — ACETAMINOPHEN 160 MG/5ML PO SOLN
650.0000 mg | ORAL | Status: DC | PRN
  Administered 2020-12-08 – 2020-12-10 (×3): 650 mg
  Filled 2020-12-08 (×3): qty 20.3

## 2020-12-08 MED ORDER — ACETAMINOPHEN 650 MG RE SUPP: 650.0000 mg | RECTAL | Status: DC | PRN

## 2020-12-08 MED ORDER — ACETAMINOPHEN 325 MG PO TABS: 650.0000 mg | ORAL_TABLET | ORAL | Status: DC | PRN

## 2020-12-08 NOTE — Plan of Care (Signed)
  Problem: Education: Goal: Knowledge of disease or condition will improve Outcome: Progressing Goal: Knowledge of patient specific risk factors addressed and post discharge goals established will improve Outcome: Progressing   Problem: Coping: Goal: Will identify appropriate support needs Outcome: Progressing   Problem: Intracerebral Hemorrhage Tissue Perfusion: Goal: Complications of Intracerebral Hemorrhage will be minimized Outcome: Progressing   Problem: Ischemic Stroke/TIA Tissue Perfusion: Goal: Complications of ischemic stroke/TIA will be minimized Outcome: Progressing

## 2020-12-08 NOTE — Progress Notes (Addendum)
[facility] PROGRESS NOTE  Charlene [name] CBS:[id number] DOB: 06-14-1966 DOA: 08/26/2020 PCP: Patient, [name] (Inactive)    October 08, 2020     10/16/2020                        10/16/2020     10/18/2020                        10/20/2020    Status: Remains inpatient appropriate because:Altered mental status, Unsafe d/c plan, IV treatments appropriate due to intensity of illness or inability to take PO and Inpatient level of care appropriate due to severity of illness   Dispo: The patient is from: Home              Anticipated d/c is to: SNF trach capable-[place] but no beds available-mental status remains variable and as of 5/23 unable to decannulate per trach team evaluation.              Patient currently is medically stable to d/c.   Difficult to place patient Yes   Level of care: Telemetry Medical  Code Status: Full Family Communication: [name] [phone]) is her POA and legal contact.  [name] was at bedside on 5/18 and given update DVT prophylaxis: Chronically bedbound.  5/6: We will discontinue prophylactic Lovenox   Vaccination status: Unvaccinated-have discussed both with patient's mother and with [name].  He wishes to speak to the rest of the family before making a decision but is leaning towards [name] vaccine.  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  Subsequently on 2/10, she developed obtundation  and respiratory arrest leading to intubation.  She has remained persistently obtunded, now has a trach and a PEG.  Hospital course complicated by persistent fever, Serratia pneumonia and now pre-orbital cellulitis  As of 3/25 with adjustments in hypertonicity medications patient began to improve regarding hypertonicity.  Eventually started on provigil with improvements in alertness.  Evaluated by rehab physician who recommended increasing provigil dose further.  Subsequently patient has become more alert.  Most days she is able to track and with encouragement able to follow simple commands especially since partial obstruction glasses placed.  See progress note dictated 4/27 for expanded details.   2/09 Admit for [place] 2/10 EVD placed, later intubated due to obtundation and respiratory arrest 2/11 MRI Brain, hypertonic saline d/c'd, EVD not working.  Husband did not consent to PICC  2/13 EVD removed 2/17 [place] discussion with family, PMT 2/24 Bedside trach and PEG 3/1 Transferred to [facility]  Subjective: Alert, tracking around room.  Leaning forward in bed and grabbing knees noted able to lift head off of the bed.  No attempts to phonate today.  Objective: Vitals:   12/08/20 0039 12/08/20 0511  BP:  132/86  Pulse: 78   Resp: 18   Temp:    SpO2: 100%     Intake/Output Summary (Last  24 hours) at 12/08/2020 0746 Last data filed at 12/08/2020 0600 Gross per 24 hour  Intake 1875 ml  Output 1000 ml  Net 875 ml   Filed Weights   11/25/20 0346 12/03/20 0500 12/08/20 0500  Weight: 75.3 kg 50.8 kg 50.6 kg    Exam:  Constitutional: Alert, uncomfortable secondary to suspected knee pain Respiratory: #4.0 cuffless trach, trach collar with FiO2 21% at 5L, PMV in place.  Clear to auscultation.  Managing oral secretions.  No increased work of breathing at rest.  No tracheal secretions. Cardiovascular: S1-S2, no tachycardia, no peripheral edema Abdomen:  PEG tube -LBM 5/19,  Musculoskeletal: Bilateral  atrophy of thigh and calf muscles-there are over both knees Neurologic: Continues to have some mild left facial droop.  Has some movement of upper extremities right is more purposeful but remains extremely weak.  Able to lift head off of bed today purposefully while reaching forward to apparent painful knee.  Currently flexed at hips with knees flexed as well.  No spasticity seen.  Partial-occlusion glasses placed.  Continues to track with both eyes although right is more consistent and able to go past the midline. Psych: Awake and nonverbal today with no attempts to phonate.  Grimacing with pain.  Repositioned with improvement in grimacing  Assessment/Plan: Acute problems: Multiple acute strokes  Right Thalamus ICH with IVH  Obstructive Hydrocephalus/Acute on persistent metabolic Encephalopathy/brain injury related hypertonicity -Continue baclofen and Provigil -Having issues with diarrhea/scheduled laxatives changed to as needed -Continue PT/OT/SLP -5/2, CIR MD reevaluated patient " Mrs. Mcguirt unfortunately has not made any meaningful functional gains over the last month. She remains most appropriate for SNF level care".   Bilateral knee osteoarthritis -Mother at bedside and reports this is a chronic problem for patient and previously treated with Tylenol -Mother reports that patient typically has flares of discomfort with rainy weather currently having discomfort -Bedside nurse notified of need for Tylenol dose.  Klebsiella UTI -Resistant to nitrofurantoin and ampicillin -Meropenem transitioned to Rocephin on 5/22 -To minimize risk of recurrent UTI have asked that pure wick only be used at bedtime on a 10 PM to 8 AM schedule  Recurrent diarrhea -Likely combination of recent antibiotics as well as scheduled laxative -MiraLAX now as needed  Hypertensive emergency/Malignant HTN -Due to financial constraints patient was not taking carvedilol and hydrochlorothiazide prior to  admission -Continue Norvasc, Lopressor and hydralazine.   Acute hypoxemic respiratory failure secondary to CVA requiring mechanical ventilation (Resolved) Trach dependent -Downsized to #4 cuffless trach on 5/8 -Per trach team: Has significant altered mentation and inability to manage oral secretions at present time is not a safe candidate for decannulation.  Discussed this with family as major obstacle in discharging to home.  This will also decrease options for SNF placement. -5/9: Reevaluated by trach team.  Again they reiterate patient is not a good candidate for decannulation "unless the family is prepared to transition to comfort should she deteriorate.".  PCCM will continue to follow weekly -5/23 despite tolerating capping trials for 48 hours trach team has reevaluated this patient and have determined that she is profoundly weak, minimally responsive and from their standpoint confers a high risk of death from aspiration.  If CODE STATUS is DNR and family otherwise would not wish to have patient reintubated for respiratory failure after decannulation they would consider decannulating her (see above regarding evaluation on 5/9)  Dysphagia -Continue tube feedings, Prosource liquid, free water -Follow CBGs and provide SSI      Other problems:  Right upper extremity thrombophlebitis - [name] -> age indeterminate SVT of right cephalic vein  Neck Contracture/torticollis -- Resolved  ESBL positive Enterobacter UTI -Has completed 5 days of meropenem -Contact isolation during hospitalization -continue Oxybutynin suspected bladder spasms  Bacterial Conjunctivitis /periorbital cellulitis -Resolved  Chronic pansinusitis/recurrent fevers 2/2 aspiration PNA;History of Serratia Pneumonia  Hospital Acquired Pneumonia:  -Completed several courses of antibiotic -Follow-up imaging including sinus CT consistent with chronic but stable and resolving sinusitis   Anemia -Stable  HLD -Continue  Lipitor      Data Reviewed: Basic Metabolic Panel: Recent Labs  Lab 12/04/20 1015 12/07/20 0451  NA 138 139  K 4.0 4.0  CL 102 100  CO2 30 31  GLUCOSE 131* 115*  BUN 17 15  CREATININE 0.54 0.48  CALCIUM 9.2 9.6  MG  --  2.0   Liver Function Tests: Recent Labs  Lab 12/04/20 1015  AST 24  ALT 38  ALKPHOS 109  BILITOT 0.2*  PROT 7.1  ALBUMIN 3.0*   CBC: Recent Labs  Lab 12/04/20 1015 12/07/20 0451  WBC 5.6 5.7  NEUTROABS 2.5  --   HGB 10.6* 10.5*  HCT 33.9* 33.5*  MCV 81.7 79.6*  PLT 324 315    CBG: Recent Labs  Lab 12/07/20 1240 12/07/20 1606 12/07/20 1948 12/08/20 0038 12/08/20 0413  GLUCAP 131* 116* 116* 126* 126*     Studies: No results found.  Scheduled Meds: . amLODipine  10 mg Per Tube Daily  . atorvastatin  40 mg Per Tube Daily  . baclofen  7.5 mg Per Tube 2 times per day  . chlorhexidine gluconate (MEDLINE KIT)  15 mL Mouth Rinse BID  . Chlorhexidine Gluconate Cloth  6 each Topical Daily  . feeding supplement (PROSource TF)  45 mL Per Tube Daily  . free water  100 mL Per Tube Q4H  . hydrALAZINE  50 mg Per Tube Q8H  . insulin aspart  0-9 Units Subcutaneous Q4H  . mouth rinse  15 mL Mouth Rinse 5 X Daily  . metoprolol tartrate  100 mg Per Tube BID  . modafinil  200 mg Per Tube Daily  . oxybutynin  5 mg Per Tube BID  . pantoprazole sodium  40 mg Per Tube BID  . tiZANidine  2 mg Per Tube QHS   Continuous Infusions: . sodium chloride Stopped (10/26/20 1445)  . cefTRIAXone (ROCEPHIN)  IV 1 g (12/07/20 1653)  . feeding supplement (OSMOLITE 1.2 CAL) 55 mL/hr at 12/08/20 0600    Principal Problem:   ICH (intracerebral hemorrhage) (HCC) Active Problems:   Acute hypoxemic respiratory failure (HCC)   Status post tracheostomy ([place])   Palliative care by specialist   Muscle hypertonicity   Oropharyngeal dysphagia   Poor prognosis   Aspiration pneumonia of right lower lobe ([place])   Infection due to extended spectrum beta lactamase  (ESBL) producing Enterobacteriaceae bacterium   Acute cystitis without hematuria   Consultants:  Neurology   Neurosurgery.   Palliative care.    PCCM continues to follow.  No decannulation.   Procedures:  Tracheostomy  PEG tube  EEG  Echocardiogram  Antibiotics: Vancomycin 2/12-2/15 Maxipime 2/12-2/15 Ancef 2/19-2/27 Vancomycin 2/20 x 1 dose Maxipime 2/27- 3/5 Vancomycin 3/1-3/3 Ancef 3/6-3/14 Cipro 3/20 4-3/30 Unasyn 3/30-4/4 Rocephin 4/11-4/12 Meropenem 4/12-4/17   Time spent: 25 minutes    Erin Hearing ANP  [facility] 7 am - 330 pm/M-F for direct patient care and secure chat Please refer to [place] for contact info 103  Days

## 2020-12-08 NOTE — TOC Progression Note (Signed)
Transition of Care [facility]) - Progression Note    Patient Details  Name: Charlene [name] MRN: [id number] Date of Birth: [birth year]  Transition of [facility]) CM/SW [place], RN Phone Number: 12/08/2020, 10:19 AM  Clinical Narrative:    Case management called and left a message with [name], husband on the phone to discuss transitions of care.  CM and Erin Hearing, NP met with the patient at the bedside and no family present in the room this morning.  Lissa Merlin, NP to follow up with the husband this afternoon to discuss tracheostomy evaluation by CCM yesterday.  CM and MSW will continue to follow the patient for SNF placement and guardianship for care placement and pending court date per Jed Limerick, [facility] attorney.   Expected Discharge Plan: Skilled Nursing Facility Barriers to Discharge: Continued Medical Work up  Expected Discharge Plan and Services Expected Discharge Plan: [place] In-house Referral: Clinical Social Work Discharge Planning Services: CM Consult Post Acute Care Choice: [place] arrangements for the past 2 months: Single Family Home                                       Social Determinants of Health (SDOH) Interventions    Readmission Risk Interventions Readmission Risk Prevention Plan 09/29/2020  Transportation Screening Complete  PCP or Specialist Appt within 3-5 Days Complete  HRI or [place] Complete  Social Work Consult for [place] Planning/Counseling Complete  Palliative Care Screening Complete  Medication Review Press photographer) Complete  Some recent data might be hidden

## 2020-12-08 NOTE — Progress Notes (Signed)
  Speech Language Pathology Treatment: Dysphagia;Cognitive-Linquistic;Passy Muir Speaking valve  Patient Details Name: Charlene [name] MRN: [id number] DOB: Sep 15, 1965 Today's Date: 12/08/2020 Time: 1020-1056 SLP Time Calculation (min) (ACUTE ONLY): 36 min  Assessment / Plan / Recommendation Clinical Impression  PT and SLP worked with [name] in tandem as she progressed slowly to an upright on the tilt bed thoughout session reaching a close to vertical position. This enhanced her positioning, alertness and awareness for communication and trials with po's. Continuing to establish an effective yes/no response system introducing eye blinks of once for yes and twice for no. This was effective x2 although at times difficult to determine intention versus reflexive blink. In addition to blinking she raised her eyebrows as more evidence of accurate response. Indicators of pain/discomfort in tilt bed and included touching he right knee and moaning with increased intensity with PMV donned prior to therapists arrival. Attempts were made to shape her vocalized moans into meaningful yes/no's via imitation without success.  Consumed single ice chips with reduced manipulation, delayed transit and swallow occurred after several minutes. She opened oral cavity for small amount of blueberry yogurt and held spoon and initiated movement toward mouth x 1. Her clinical oropharyngeal phase is not yet functional for instrumental assessment however will continue trials with SLP only (educated mom on not administering ice).    HPI HPI: Pt is 55 y/o female presents [facility] on 2/9 with L sided weakness and headaches. PMH includes anxiety, HTN. 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 s/p Right frontal ventricular catheter placement on 2/10, stopped functioning on 2/11.  MRI 2/11 shows right  greater than left mesial temporal lobes / parahippocampal cortex, bilateral basal ganglia, and possibly the upper pons that are concerning for acute infarcts, most likely secondary to mass effect from the hemorrhage. ETT 2/10; trach/PEG 09/10/20.  Trach changed to #6 cuffless 3/11. Hospital course complicated by persistent fever,  Serratia pneumonia and pre-orbital cellulitis.      SLP Plan  Continue with current plan of care       Recommendations  Diet recommendations: NPO Medication Administration: Via alternative means      Patient may use Passy-Muir Speech Valve: Intermittently with supervision;Caregiver trained to provide supervision;During all therapies with supervision PMSV Supervision: Full         Oral Care Recommendations: Oral care QID Follow up Recommendations: Skilled Nursing facility SLP Visit Diagnosis: Aphonia (R49.1);Dysphagia, unspecified (R13.10);Cognitive communication deficit (X50.569) Plan: Continue with current plan of care       GO                Royce Macadamia 12/08/2020, 11:58 AM

## 2020-12-08 NOTE — Progress Notes (Signed)
Physical Therapy Treatment Patient Details Name: Charlene [name] MRN: [id number] DOB: [birth year] Today's Date: 12/08/2020    History of Present Illness Pt is 55 y/o female presents to [facility] on 08/26/20 with L sided weakness and headaches. 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 s/p Right frontal ventricular catheter placement on 2/10, stopped functioning on 2/11.  MRI 2/11 shows right greater than left mesial temporal lobes / parahippocampal cortex, bilateral basal ganglia, and possibly the upper pons that are concerning for acute infarcts, most likely secondary to mass effect from the hemorrhage. ETT 2/10-2/24.  Pt with trach and PEG on 09/10/20. Hospital course complicated by persistent fever, Serratia pneumonia and now pre-orbital cellulitis.   PMH includes anxiety, HTN.    PT Comments    Co-session with SLP working on tolerance to upright tilt (up to 55 degrees today VSS on RA--see vitals flow sheet for details), ROM to bil LEs and L UE, and general cognitive engagement (encouraging responses to blinking, command following, tracking, etc).  Pt with louder vocalizations to pain today.  I would like to alternate tilting days with OOB to WC days if at all possible, and on OOB to WC days working on seated trunk control at EOB, or from fully upright WC?     Follow Up Recommendations  SNF     Equipment Recommendations  Hospital bed;Wheelchair cushion (measurements PT);Wheelchair (measurements PT);Other (comment) (18x18 tilt in space WC, hoyer lift)    Recommendations for Other Services       Precautions / Restrictions Precautions Precautions: Fall Precaution Comments: peg and trach Required Braces or Orthoses: Other Brace Other Brace: RLE soft knee brace and LUE soft elbow brace (wearing schedule in RN orders); bil PRAFOs    Mobility  Bed  Mobility Overal bed mobility: Needs Assistance             General bed mobility comments: total assist +2 to reposition higher in bed (scoot up) in preparation for chair mode at end of session. Angle: 55 degrees (max angle) Total Minutes in Angle: 5 minutes Patient Response:  (seemed painful as she was reaching down to her right leg/knee)  Transfers                    Ambulation/Gait                 Stairs             Wheelchair Mobility    Modified Rankin (Stroke Patients Only)       Balance                                            Cognition Arousal/Alertness: Awake/alert Behavior During Therapy: Flat affect Overall Cognitive Status: Impaired/Different from baseline Area of Impairment: Orientation;Attention;Memory;Following commands;Safety/judgement;Awareness;Problem solving                 Orientation Level: Disoriented to;Place;Time;Situation (seems to recognize family, especially responsive to her husband, Charlene [name] when he is here) Current Attention Level: Focused (breif periods of sustained related to assisted self feeding) Memory: Decreased short-term memory Following Commands: Follows one step commands inconsistently;Follows one step commands with increased time Safety/Judgement: Decreased awareness of safety;Decreased awareness of deficits Awareness: Intellectual Problem Solving: Slow processing;Decreased initiation;Requires verbal cues;Difficulty  sequencing;Requires tactile cues General Comments: Pt participating in assisted self feeding today, even was able to blink twice to indicate she wanted more yogurt, but could not consistantly reproduce, reaching towards painful R knee/leg appropriately, and making better eye contact in midline when her name is called.      Exercises General Exercises - Upper Extremity Elbow Extension: PROM;Left (for PROM and to get on soft elbow splint) Other Exercises Other Exercises:  knee extension ROM bil prior to application of tilt bed straps.    General Comments General comments (skin integrity, edema, etc.): VSS throughout on RA, see vitals flow sheet for specific BPs at specific angles.      Pertinent Vitals/Pain Pain Assessment: Faces Faces Pain Scale: Hurts whole lot Pain Location: grimacing with ROM to LEs and WB during tilt Pain Descriptors / Indicators: Grimacing;Guarding;Moaning Pain Intervention(s): Limited activity within patient's tolerance;Monitored during session;Repositioned;RN gave pain meds during session    Home Living                      Prior Function            PT Goals (current goals can now be found in the care plan section) Progress towards PT goals: Progressing toward goals    Frequency    Min 2X/week      PT Plan Current plan remains appropriate    Co-evaluation PT/OT/SLP Co-Evaluation/Treatment: Yes (with SLP) Reason for Co-Treatment: Complexity of the patient's impairments (multi-system involvement);Necessary to address cognition/behavior during functional activity;For patient/therapist safety;To address functional/ADL transfers PT goals addressed during session: Mobility/safety with mobility;Balance;Proper use of DME;Strengthening/ROM        AM-PAC PT "6 Clicks" Mobility   Outcome Measure  Help needed turning from your back to your side while in a flat bed without using bedrails?: Total Help needed moving from lying on your back to sitting on the side of a flat bed without using bedrails?: Total Help needed moving to and from a bed to a chair (including a wheelchair)?: Total Help needed standing up from a chair using your arms (e.g., wheelchair or bedside chair)?: Total Help needed to walk in hospital room?: Total Help needed climbing 3-5 steps with a railing? : Total 6 Click Score: 6    End of Session   Activity Tolerance: Patient limited by pain Patient left: in bed;with call bell/phone within  reach;with family/visitor present (mom present today) Nurse Communication: Mobility status;Other (comment) (left in high chair mode) PT Visit Diagnosis: Hemiplegia and hemiparesis;Other symptoms and signs involving the nervous system (R29.898);Other abnormalities of gait and mobility (R26.89) Hemiplegia - Right/Left: Right Hemiplegia - dominant/non-dominant: Dominant Hemiplegia - caused by: Cerebral infarction;Nontraumatic intracerebral hemorrhage;Other Nontraumatic intracranial hemorrhage     Time: 1000-1100 PT Time Calculation (min) (ACUTE ONLY): 60 min  Charges:  $Therapeutic Exercise: 8-22 mins $Therapeutic Activity: 38-52 mins                     Corinna Capra, PT, DPT  Acute Rehabilitation [phone] pager [phone]) [phone] office

## 2020-12-09 LAB — CULTURE, BLOOD (ROUTINE X 2)
Culture: NO GROWTH
Culture: NO GROWTH
Special Requests: ADEQUATE
Special Requests: ADEQUATE

## 2020-12-09 LAB — GLUCOSE, CAPILLARY
Glucose-Capillary: 105 mg/dL — ABNORMAL HIGH (ref 70–99)
Glucose-Capillary: 111 mg/dL — ABNORMAL HIGH (ref 70–99)
Glucose-Capillary: 114 mg/dL — ABNORMAL HIGH (ref 70–99)
Glucose-Capillary: 116 mg/dL — ABNORMAL HIGH (ref 70–99)
Glucose-Capillary: 128 mg/dL — ABNORMAL HIGH (ref 70–99)
Glucose-Capillary: 129 mg/dL — ABNORMAL HIGH (ref 70–99)
Glucose-Capillary: 131 mg/dL — ABNORMAL HIGH (ref 70–99)

## 2020-12-09 NOTE — Plan of Care (Signed)
  Problem: Education: Goal: Knowledge of disease or condition will improve Outcome: Not Progressing Goal: Knowledge of patient specific risk factors addressed and post discharge goals established will improve Outcome: Not Progressing   Problem: Coping: Goal: Will identify appropriate support needs Outcome: Not Progressing   Problem: Health Behavior/Discharge Planning: Goal: Ability to manage health-related needs will improve Outcome: Not Progressing   Problem: Self-Care: Goal: Verbalization of feelings and concerns over difficulty with self-care will improve Outcome: Not Progressing Goal: Ability to communicate needs accurately will improve Outcome: Not Progressing   Problem: Nutrition: Goal: Risk of aspiration will decrease Outcome: Progressing   Problem: Intracerebral Hemorrhage Tissue Perfusion: Goal: Complications of Intracerebral Hemorrhage will be minimized Outcome: Progressing   Problem: Intracerebral Hemorrhage Tissue Perfusion: Goal: Complications of Intracerebral Hemorrhage will be minimized Outcome: Progressing   Problem: Ischemic Stroke/TIA Tissue Perfusion: Goal: Complications of ischemic stroke/TIA will be minimized Outcome: Progressing

## 2020-12-09 NOTE — Progress Notes (Signed)
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  Patient remains obtunded/altered.  Currently has PEG and trach in place.  Hospital course complicated by persistent fever, Serratia pneumonia and preorbital cellulitis, ESBL Enterobacter UTI.  Patient with hypertonicity, adjustments to medications started in March.  Currently on Provigil to help with alertness.  Patient was evaluated by PMR physician, Provigil dose was increased.  Continues to have minimal interaction.  Currently pending placement. Assessment & Plan   Multiple acute strokes/right thalamus ICH with IVH/obstructive hydrocephalus/metabolic encephalopathy -Currently on baclofen and Provigil -Has had some issues with diarrhea -PMR physician reevaluated patient and felt that patient has not made meaningful functional gains over the last month and remains SNF appropriate -PT OT and speech continue to follow patient  Acute hypoxic respiratory failure secondary to CVA -Patient required vent and currently has trach in place.  Downsized to #4 cuffless on 5/8 -PCCM/trach team continues to follow-currently she is not a candidate for decannulation given her inability to manage her oral secretions -Patient was last seen on 5/23, capping trials were attempted for 48 hours however patient was found to be profoundly weak and minimally responsive, and is at high risk of high risk of death from aspiration  Dysphagia -Continue tube feeds, Prosource and free water flushes  Bilateral knee osteoarthritis -Has been a chronic problem, treated with Tylenol  Recurrent diarrhea -Suspect due to combination of antibiotics, tube feeds, as well as laxatives  which have now been  changed to as needed  Hypertensive emergency/malignant hypertension -Patient was not taking her medications prior to admission (Coreg, HCTZ) -BP stable, continue amlodipine, metoprolol, hydralazine  Right upper extremity thrombophlebitis -Ultrasound showed age-indeterminate SVT of the right cephalic vein  Neck contracture/torticollis -Resolved  ESBL Enterobacter UTI (4/8) / Klebsiella pneumoniae UTI (5/20) -Klebsiella resistant to nitrofurantoin and ampicillin -Patient completed 5 days of Merrem- now on ceftriaxone -Continue oxybutynin for bladder spasm  Bacterial conjunctivitis/periorbital cellulitis -Resolved  Chronic pansinusitis/aspiration pneumonia with recurrent fevers -Hospital-acquired -Patient has completed several courses of antibiotics during hospitalization -CT noted chronic but stable and resolving sinusitis  Chronic Anemia -Stable, last hemoglobin 10.5 -Continue to monitor CBC  Hyperlipidemia -Continue statin  DVT Prophylaxis  SCDs  Code Status: Full  Family Communication: None at bedside  Disposition Plan:  Status is: Inpatient  Remains inpatient appropriate because:Unsafe d/c plan   Dispo: The patient is from: Home              Anticipated d/c is to: SNF              Patient currently is not medically stable to d/c.   Difficult to place patient Yes    Consultants Neurology  [facility] PCCM Palliative care  Procedures  Tracheostomy PEG tube placement EEG Echocardiogram  Antibiotics   Anti-infectives (From admission, onward)   Start     Dose/Rate Route Frequency Ordered Stop   12/06/20 1500  cefTRIAXone (ROCEPHIN) 1 g in sodium chloride 0.9 % 100 mL IVPB        1 g 200 mL/hr over 30 Minutes Intravenous Every 24 hours 12/06/20 1404 12/10/20 2359   12/04/20 1400  meropenem (MERREM) 1 g in sodium chloride 0.9 % 100 mL IVPB  Status:  Discontinued  1 g 200 mL/hr over 30 Minutes Intravenous Every 8 hours 12/04/20 1207 12/06/20  1404   10/27/20 0845  meropenem (MERREM) 1 g in sodium chloride 0.9 % 100 mL IVPB  Status:  Discontinued        1 g 200 mL/hr over 30 Minutes Intravenous Every 8 hours 10/27/20 0752 11/01/20 0720   10/26/20 1145  cefTRIAXone (ROCEPHIN) 1 g in sodium chloride 0.9 % 100 mL IVPB  Status:  Discontinued        1 g 200 mL/hr over 30 Minutes Intravenous Every 24 hours 10/26/20 1056 10/27/20 0752   10/14/20 1815  Ampicillin-Sulbactam (UNASYN) 3 g in sodium chloride 0.9 % 100 mL IVPB        3 g 200 mL/hr over 30 Minutes Intravenous Every 6 hours 10/14/20 1754 10/19/20 1340   10/08/20 0845  ciprofloxacin (CIPRO) tablet 500 mg  Status:  Discontinued        500 mg Per Tube 2 times daily 10/08/20 0754 10/14/20 1731   09/21/20 1545  ceFAZolin (ANCEF) IVPB 1 g/50 mL premix        1 g 100 mL/hr over 30 Minutes Intravenous Every 8 hours 09/21/20 1457 09/28/20 0647   09/20/20 0600  ceFAZolin (ANCEF) IVPB 1 g/50 mL premix  Status:  Discontinued        1 g 100 mL/hr over 30 Minutes Intravenous Every 8 hours 09/14/20 0903 09/14/20 0904   09/16/20 1300  vancomycin (VANCOREADY) IVPB 1250 mg/250 mL  Status:  Discontinued        1,250 mg 166.7 mL/hr over 90 Minutes Intravenous Every 24 hours 09/15/20 1232 09/17/20 0956   09/15/20 1230  vancomycin (VANCOREADY) IVPB 1250 mg/250 mL        1,250 mg 166.7 mL/hr over 90 Minutes Intravenous  Once 09/15/20 1131 09/15/20 1402   09/13/20 1400  ceFEPIme (MAXIPIME) 2 g in sodium chloride 0.9 % 100 mL IVPB        2 g 200 mL/hr over 30 Minutes Intravenous Every 8 hours 09/13/20 1341 09/19/20 2208   09/06/20 0600  vancomycin (VANCOREADY) IVPB 1250 mg/250 mL        1,250 mg 166.7 mL/hr over 90 Minutes Intravenous  Once 09/06/20 0541 09/06/20 0739   09/05/20 1400  ceFAZolin (ANCEF) IVPB 1 g/50 mL premix  Status:  Discontinued        1 g 100 mL/hr over 30 Minutes Intravenous Every 8 hours 09/05/20 1120 09/13/20 1341   08/30/20 0600  vancomycin (VANCOREADY) IVPB 500 mg/100 mL   Status:  Discontinued        500 mg 100 mL/hr over 60 Minutes Intravenous Every 12 hours 08/29/20 1554 09/01/20 1115   08/29/20 1700  ceFEPIme (MAXIPIME) 2 g in sodium chloride 0.9 % 100 mL IVPB  Status:  Discontinued        2 g 200 mL/hr over 30 Minutes Intravenous Every 12 hours 08/29/20 1554 09/01/20 1115   08/29/20 1645  vancomycin (VANCOREADY) IVPB 1250 mg/250 mL        1,250 mg 166.7 mL/hr over 90 Minutes Intravenous  Once 08/29/20 1554 08/29/20 1820      Subjective:   Roshawn Lacina seen and examined today.  Minimally tracking, nonverbal. Objective:   Vitals:   12/09/20 0817 12/09/20 0855 12/09/20 1145 12/09/20 1207  BP: 125/80   135/82  Pulse: 83 76 77 73  Resp: _0 Temp: 98.8 F (37.1 C)   98.1 F (36.7 C)  TempSrc:  Oral   Oral  SpO2: 100% 100% 100% 100%  Weight:      Height:       No intake or output data in the 24 hours ending 12/09/20 1408 Filed Weights   12/03/20 0500 12/08/20 0500 12/09/20 0500  Weight: 50.8 kg 50.6 kg 52.9 kg    Exam  General: Well developed, chronically ill-appearing, NAD  HEENT: NCAT, PERRLA, EOMI, Anicteic Sclera, mucous membranes moist.   Neck: Trach in place  Cardiovascular: S1 S2 auscultated, RRR.  Respiratory: Diminished breath sounds anteriorly  Abdomen: Soft, nontender, nondistended, + bowel sounds, PEG tube  Extremities: warm dry without cyanosis clubbing or edema-bilateral atrophy of thigh and calf muscles  Neuro: Awake, appears alert however not tracking me in the room and not following commands.   Data Reviewed: I have personally reviewed following labs and imaging studies  CBC: Recent Labs  Lab 12/04/20 1015 12/07/20 0451  WBC 5.6 5.7  NEUTROABS 2.5  --   HGB 10.6* 10.5*  HCT 33.9* 33.5*  MCV 81.7 79.6*  PLT 324 676   Basic Metabolic Panel: Recent Labs  Lab 12/04/20 1015 12/07/20 0451  NA 138 139  K 4.0 4.0  CL 102 100  CO2 30 31  GLUCOSE 131* 115*  BUN 17 15  CREATININE 0.54 0.48   CALCIUM 9.2 9.6  MG  --  2.0   GFR: Estimated Creatinine Clearance: 67.9 mL/min (by C-G formula based on SCr of 0.48 mg/dL). Liver Function Tests: Recent Labs  Lab 12/04/20 1015  AST 24  ALT 38  ALKPHOS 109  BILITOT 0.2*  PROT 7.1  ALBUMIN 3.0*   No results for input(s): LIPASE, AMYLASE in the last 168 hours. No results for input(s): AMMONIA in the last 168 hours. Coagulation Profile: No results for input(s): INR, PROTIME in the last 168 hours. Cardiac Enzymes: No results for input(s): CKTOTAL, CKMB, CKMBINDEX, TROPONINI in the last 168 hours. BNP (last 3 results) No results for input(s): PROBNP in the last 8760 hours. HbA1C: No results for input(s): HGBA1C in the last 72 hours. CBG: Recent Labs  Lab 12/08/20 2041 12/09/20 0026 12/09/20 0412 12/09/20 0859 12/09/20 1252  GLUCAP 99 128* 129* 105* 131*   Lipid Profile: No results for input(s): CHOL, HDL, LDLCALC, TRIG, CHOLHDL, LDLDIRECT in the last 72 hours. Thyroid Function Tests: No results for input(s): TSH, T4TOTAL, FREET4, T3FREE, THYROIDAB in the last 72 hours. Anemia Panel: No results for input(s): VITAMINB12, FOLATE, FERRITIN, TIBC, IRON, RETICCTPCT in the last 72 hours. Urine analysis:    Component Value Date/Time   COLORURINE STRAW (A) 12/04/2020 1030   APPEARANCEUR TURBID (A) 12/04/2020 1030   LABSPEC 1.010 12/04/2020 1030   PHURINE 7.0 12/04/2020 1030   GLUCOSEU NEGATIVE 12/04/2020 1030   HGBUR NEGATIVE 12/04/2020 1030   BILIRUBINUR NEGATIVE 12/04/2020 1030   KETONESUR NEGATIVE 12/04/2020 1030   PROTEINUR NEGATIVE 12/04/2020 1030   UROBILINOGEN 0.2 02/21/2014 1629   NITRITE NEGATIVE 12/04/2020 1030   LEUKOCYTESUR TRACE (A) 12/04/2020 1030   Sepsis Labs: _0 (procalcitonin:4,lacticidven:4)  ) Recent Results (from the past 240 hour(s))  Culture, Urine     Status: Abnormal   Collection Time: 12/04/20  9:07 AM   Specimen: Urine, Catheterized  Result Value Ref Range Status   Specimen  Description URINE, CATHETERIZED  Final   Special Requests   Final    NONE Performed at [facility], [place] [address] [place], [place] [address]    Culture >=100,000 COLONIES/mL KLEBSIELLA PNEUMONIAE (A)  Final  Report Status 12/06/2020 FINAL  Final   Organism ID, Bacteria KLEBSIELLA PNEUMONIAE (A)  Final      Susceptibility   Klebsiella pneumoniae - MIC*    AMPICILLIN >=32 RESISTANT Resistant     CEFAZOLIN <=4 SENSITIVE Sensitive     CEFEPIME <=0.12 SENSITIVE Sensitive     CEFTRIAXONE <=0.25 SENSITIVE Sensitive     CIPROFLOXACIN 0.5 SENSITIVE Sensitive     GENTAMICIN <=1 SENSITIVE Sensitive     IMIPENEM <=0.25 SENSITIVE Sensitive     NITROFURANTOIN 128 RESISTANT Resistant     TRIMETH/SULFA <=20 SENSITIVE Sensitive     AMPICILLIN/SULBACTAM 8 SENSITIVE Sensitive     PIP/TAZO 8 SENSITIVE Sensitive     * >=100,000 COLONIES/mL KLEBSIELLA PNEUMONIAE  Resp Panel by RT-PCR (Flu A&B, Covid) Nasopharyngeal Swab     Status: None   Collection Time: 12/04/20 10:10 AM   Specimen: Nasopharyngeal Swab; Nasopharyngeal(NP) swabs in vial transport medium  Result Value Ref Range Status   SARS Coronavirus 2 by RT PCR NEGATIVE NEGATIVE Final    Comment: (NOTE) SARS-CoV-2 target nucleic acids are NOT DETECTED.  The SARS-CoV-2 RNA is generally detectable in upper respiratory specimens during the acute phase of infection. The lowest concentration of SARS-CoV-2 viral copies this assay can detect is 138 copies/mL. A negative result does not preclude SARS-Cov-2 infection and should not be used as the sole basis for treatment or other patient management decisions. A negative result may occur with  improper specimen collection/handling, submission of specimen other than nasopharyngeal swab, presence of viral mutation(s) within the areas targeted by this assay, and inadequate number of viral copies(<138 copies/mL). A negative result must be combined with clinical observations, patient history, and  epidemiological information. The expected result is Negative.  Fact Sheet for Patients:  EntrepreneurPulse.com.au  Fact Sheet for Healthcare Providers:  IncredibleEmployment.be  This test is no t yet approved or cleared by the [place] FDA and  has been authorized for detection and/or diagnosis of SARS-CoV-2 by FDA under an Emergency Use Authorization (EUA). This EUA will remain  in effect (meaning this test can be used) for the duration of the COVID-19 declaration under Section 564(b)(1) of the Act, 21 U.S.C.section 360bbb-3(b)(1), unless the authorization is terminated  or revoked sooner.       Influenza A by PCR NEGATIVE NEGATIVE Final   Influenza B by PCR NEGATIVE NEGATIVE Final    Comment: (NOTE) The Xpert Xpress SARS-CoV-2/FLU/RSV plus assay is intended as an aid in the diagnosis of influenza from Nasopharyngeal swab specimens and should not be used as a sole basis for treatment. Nasal washings and aspirates are unacceptable for Xpert Xpress SARS-CoV-2/FLU/RSV testing.  Fact Sheet for Patients: EntrepreneurPulse.com.au  Fact Sheet for Healthcare Providers: IncredibleEmployment.be  This test is not yet approved or cleared by the [place] FDA and has been authorized for detection and/or diagnosis of SARS-CoV-2 by FDA under an Emergency Use Authorization (EUA). This EUA will remain in effect (meaning this test can be used) for the duration of the COVID-19 declaration under Section 564(b)(1) of the Act, 21 U.S.C. section 360bbb-3(b)(1), unless the authorization is terminated or revoked.  Performed at [facility], [place] [address] [place], [place] [address]   Culture, blood (Routine X 2) w Reflex to ID Panel     Status: None   Collection Time: 12/04/20 10:14 AM   Specimen: BLOOD LEFT HAND  Result Value Ref Range Status   Specimen Description BLOOD LEFT HAND  Final   Special  Requests  Final    BOTTLES DRAWN AEROBIC AND ANAEROBIC Blood Culture adequate volume   Culture   Final    NO GROWTH 5 DAYS Performed at [facility], [place] [address] [place], [place] [address]    Report Status 12/09/2020 FINAL  Final  Culture, blood (Routine X 2) w Reflex to ID Panel     Status: None   Collection Time: 12/04/20 10:16 AM   Specimen: BLOOD  Result Value Ref Range Status   Specimen Description BLOOD RIGHT ANTECUBITAL  Final   Special Requests   Final    BOTTLES DRAWN AEROBIC AND ANAEROBIC Blood Culture adequate volume   Culture   Final    NO GROWTH 5 DAYS Performed at [facility], [place] [address], [place], [place] [address]    Report Status 12/09/2020 FINAL  Final      Radiology Studies: No results found.   Scheduled Meds: . amLODipine  10 mg Per Tube Daily  . atorvastatin  40 mg Per Tube Daily  . baclofen  7.5 mg Per Tube 2 times per day  . chlorhexidine gluconate (MEDLINE KIT)  15 mL Mouth Rinse BID  . Chlorhexidine Gluconate Cloth  6 each Topical Daily  . feeding supplement (PROSource TF)  45 mL Per Tube Daily  . free water  100 mL Per Tube Q4H  . hydrALAZINE  50 mg Per Tube Q8H  . insulin aspart  0-9 Units Subcutaneous Q4H  . mouth rinse  15 mL Mouth Rinse 5 X Daily  . metoprolol tartrate  100 mg Per Tube BID  . modafinil  200 mg Per Tube Daily  . oxybutynin  5 mg Per Tube BID  . pantoprazole sodium  40 mg Per Tube BID  . tiZANidine  2 mg Per Tube QHS   Continuous Infusions: . sodium chloride Stopped (10/26/20 1445)  . cefTRIAXone (ROCEPHIN)  IV 1 g (12/08/20 1819)  . feeding supplement (OSMOLITE 1.2 CAL) 1,000 mL (12/08/20 2033)     LOS: 104 days   Time Spent in minutes   45 minutes  Rafel Garde D.O. on 12/09/2020 at 2:08 PM  Between 7am to 7pm - Please see pager noted on [email or web address]  After 7pm go to [email or web address]  And look for the night coverage person covering for me after hours  [facility] Office   [phone]

## 2020-12-09 NOTE — Progress Notes (Signed)
Occupational Therapy Treatment Patient Details Name: LESHAWN HOUSEWORTH MRN: [id number] DOB: [birth year] Today's Date: 12/09/2020    History of present illness Pt is 55 y/o female presents to [facility] on 08/26/20 with L sided weakness and headaches. 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 s/p Right frontal ventricular catheter placement on 2/10, stopped functioning on 2/11.  MRI 2/11 shows right greater than left mesial temporal lobes / parahippocampal cortex, bilateral basal ganglia, and possibly the upper pons that are concerning for acute infarcts, most likely secondary to mass effect from the hemorrhage. ETT 2/10-2/24.  Pt with trach and PEG on 09/10/20. Hospital course complicated by persistent fever, Serratia pneumonia and now pre-orbital cellulitis.   PMH includes anxiety, HTN.   OT comments  Patient asleep upon entry, awakens with removal of blankets and adjusting bed.  Session focused on trunk mobilizations in supine, then progressing to EOB.  Discomfort noted by grimacing with hip rotation to L (compared to R increased stiffness) but fades with prolonged stretch with facilitation of ribs and shoulder.  Total assist for rolling and transition to EOB.  At best min assist sitting EOB with R lateral lean.  Facilitation of R ribs and sternum to promote trunk extension to allow activation of head/neck extension, which fades with fatigue (after 2 trails).   When activated into neck extension progressed with facilitation of trunk rotation and cervical rotation (pt initiation to R and assist to L). Donned soft splints to L elbow and knee. Will continue to follow.       Follow Up Recommendations  SNF;Supervision/Assistance - 24 hour    Equipment Recommendations  None recommended by OT    Recommendations for Other Services      Precautions / Restrictions  Precautions Precautions: Fall Precaution Comments: peg and trach Required Braces or Orthoses: Other Brace Other Brace: RLE soft knee brace and LUE soft elbow brace (wearing schedule in RN orders); bil PRAFOs Restrictions Weight Bearing Restrictions: No       Mobility Bed Mobility Overal bed mobility: Needs Assistance Bed Mobility: Rolling;Sidelying to Sit;Sit to Sidelying Rolling: Total assist Sidelying to sit: Total assist;+2 for physical assistance;+2 for safety/equipment     Sit to sidelying: Total assist;+2 for physical assistance;+2 for safety/equipment General bed mobility comments: total assist to roll towards L side and ascend to EOB    Transfers                      Balance Overall balance assessment: Needs assistance Sitting-balance support: No upper extremity supported;Feet supported Sitting balance-Leahy Scale: Poor Sitting balance - Comments: as good as min assist at EOB Postural control: Right lateral lean                                 ADL either performed or assessed with clinical judgement   ADL                                               Vision       Perception     Praxis      Cognition Arousal/Alertness: Awake/alert Behavior During Therapy: Flat affect Overall Cognitive Status: Impaired/Different from baseline Area of  Impairment: Following commands;Problem solving                       Following Commands: Follows one step commands inconsistently     Problem Solving: Slow processing;Decreased initiation;Difficulty sequencing;Requires verbal cues;Requires tactile cues General Comments: pt reaching with R UE during mobilizations to R knee as in discomfort and decreases reach with relaxation, attempts to hold head up and turn head towards R side; scanning and tracking therapist with R eye        Exercises Other Exercises Other Exercises: R knee and L UE extension and donned soft braces    Shoulder Instructions       General Comments session focused on trunk mobilizations, supine completing lower back mobilizations by rotation hips, bringing knees up and rotating hips with moderate tactile support to reduce opposite shoulder elevation due to stiffness (noted increased ROM on L side compared to R side).  Sitting EOB engaged in further trunk and cervical mobilizations, faciliating at sternum and R ribs to promote depression and adduction of R ribcage to align spine and promote head/neck extension.  Initally pt able to assist in neck extension (first 2 trials) but fatigues and activiation fades.  When in extension worked on rotations to L and R.    Pertinent Vitals/ Pain       Pain Assessment: Faces Faces Pain Scale: Hurts even more Pain Location: grimacing to ROM of R LE and inital stretch of trunk Pain Descriptors / Indicators: Discomfort;Grimacing Pain Intervention(s): Limited activity within patient's tolerance;Monitored during session;Repositioned  Home Living                                          Prior Functioning/Environment              Frequency  Min 2X/week        Progress Toward Goals  OT Goals(current goals can now be found in the care plan section)  Progress towards OT goals: Progressing toward goals (slowly)  Acute Rehab OT Goals Patient Stated Goal: Pt unable to state  Plan Discharge plan remains appropriate;Frequency remains appropriate    Co-evaluation                 AM-PAC OT "6 Clicks" Daily Activity     Outcome Measure   Help from another person eating meals?: Total Help from another person taking care of personal grooming?: Total Help from another person toileting, which includes using toliet, bedpan, or urinal?: Total Help from another person bathing (including washing, rinsing, drying)?: Total Help from another person to put on and taking off regular upper body clothing?: Total Help from another person  to put on and taking off regular lower body clothing?: Total 6 Click Score: 6    End of Session Equipment Utilized During Treatment: Oxygen  OT Visit Diagnosis: Muscle weakness (generalized) (M62.81);Apraxia (R48.2);Cognitive communication deficit (R41.841);Hemiplegia and hemiparesis Symptoms and signs involving cognitive functions: Other Nontraumatic ICH Hemiplegia - Right/Left: Right Hemiplegia - caused by: Other Nontraumatic intracranial hemorrhage   Activity Tolerance Patient tolerated treatment well   Patient Left in bed;with call bell/phone within reach;with family/visitor present   Nurse Communication Mobility status        Time: 9417-4081 OT Time Calculation (min): 49 min  Charges: OT General Charges $OT Visit: 1 Visit OT Treatments $Neuromuscular Re-education: 38-52 mins  Barry Brunner, OT Acute Rehabilitation  Services Pager [phone] Office [phone]    [name] 12/09/2020, 3:59 PM

## 2020-12-09 NOTE — Progress Notes (Signed)
CSW completed Petition for Adjudication of Incompetence and Application for Appointment of Guardian or Limited Guardian along with Guardianship Capacity Questionnaire. Documents will be retrieved and submitted by Marlene Lard of [facility].   Edwin Dada, MSW, LCSW Transitions of Care  Clinical Social Worker II [phone]

## 2020-12-10 LAB — GLUCOSE, CAPILLARY
Glucose-Capillary: 100 mg/dL — ABNORMAL HIGH (ref 70–99)
Glucose-Capillary: 118 mg/dL — ABNORMAL HIGH (ref 70–99)
Glucose-Capillary: 120 mg/dL — ABNORMAL HIGH (ref 70–99)
Glucose-Capillary: 126 mg/dL — ABNORMAL HIGH (ref 70–99)
Glucose-Capillary: 133 mg/dL — ABNORMAL HIGH (ref 70–99)
Glucose-Capillary: 141 mg/dL — ABNORMAL HIGH (ref 70–99)

## 2020-12-10 MED ORDER — DICLOFENAC SODIUM 1 % EX GEL
4.0000 g | Freq: Four times a day (QID) | CUTANEOUS | Status: DC
  Administered 2020-12-10 – 2021-04-16 (×501): 4 g via TOPICAL
  Filled 2020-12-10 (×13): qty 100

## 2020-12-10 NOTE — Progress Notes (Signed)
  Speech Language Pathology Treatment: Dysphagia;Cognitive-Linquistic;Passy Muir Speaking valve  Patient Details Name: Charlene [name] MRN: [id number] DOB: 03-30-1966 Today's Date: 12/10/2020 Time: 1139-1209 SLP Time Calculation (min) (ACUTE ONLY): 30 min  Assessment / Plan / Recommendation Clinical Impression  Therapy in conjunction with PT placing pt upright in the tilt bed to facilitate her arousal and participation in therapy. PMV donned on arrival and oral care recently performed with lips moist. Given hand over hand assist she self feed pudding and cup sip water with combination of arm spasticity and weak manual/wrist dexterity. She tracked pudding cup from right to slightly passed midline. Therapist asked pt yes/no questions relevant and in context of activity giving her additional time to respond with gestures and/or modulating her inflection during moaning vocalizations  (do you want more pudding? does your knee hurt?- pt was grimacing) without indication of her thoughts/wishes. Several times she appeared to mouth 1-2 words without phonation and minimal articulatory movement.    HPI HPI: Pt is 55 y/o female presents to [facility] on 2/9 with L sided weakness and headaches. PMH includes anxiety, HTN. 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 s/p Right frontal ventricular catheter placement on 2/10, stopped functioning on 2/11.  MRI 2/11 shows right greater than left mesial temporal lobes / parahippocampal cortex, bilateral basal ganglia, and possibly the upper pons that are concerning for acute infarcts, most likely secondary to mass effect from the hemorrhage. ETT 2/10; trach/PEG 09/10/20.  Trach changed to #6 cuffless 3/11. Hospital course complicated by persistent fever,  Serratia pneumonia and pre-orbital cellulitis.      SLP Plan  Continue with current plan  of care       Recommendations  Diet recommendations: NPO Medication Administration: Via alternative means      Patient may use Passy-Muir Speech Valve: Intermittently with supervision;Caregiver trained to provide supervision;During all therapies with supervision PMSV Supervision: Full         Oral Care Recommendations: Oral care QID Follow up Recommendations: Skilled Nursing facility SLP Visit Diagnosis: Aphonia (R49.1);Dysphagia, unspecified (R13.10);Cognitive communication deficit (K44.010) Plan: Continue with current plan of care       GO                Royce Macadamia 12/10/2020, 2:59 PM Breck Coons Lonell Face.Ed Nurse, children's [phone] Office [phone]

## 2020-12-10 NOTE — Progress Notes (Addendum)
Physical Therapy Treatment Patient Details Name: Charlene [name] MRN: [id number] DOB: 08-Oct-1965 Today's Date: 12/10/2020    History of Present Illness Pt is 55 y/o female presents to [facility] on 08/26/20 with L sided weakness and headaches. 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 s/p Right frontal ventricular catheter placement on 2/10, stopped functioning on 2/11.  MRI 2/11 shows right greater than left mesial temporal lobes / parahippocampal cortex, bilateral basal ganglia, and possibly the upper pons that are concerning for acute infarcts, most likely secondary to mass effect from the hemorrhage. ETT 2/10-2/24.  Pt with trach and PEG on 09/10/20. Hospital course complicated by persistent fever, Serratia pneumonia and now pre-orbital cellulitis.   PMH includes anxiety, HTN.    PT Comments    Co-treat with SLP working on tolerance to upright tilt of 60 which pt sustained for ~10 minutes by end of session. Total time in tilt working up to this ~30 minutes.  Pt interacting more with therapists today, more when pt supine and less in tilt. Suspect pt internally distracted by discomfort in R knee, and also distracted by R railing and wanting to grab/hold it. She gave a thumbs up x2 to command with ~45 second delay. Pt nodded yes x1 and shook head no x1. PROM provided to B knees and L elbow prior to tilt, and cervical rotation stretch to the L performed in ~12 tilt. Will plan for EOB activity next week to continue working towards improved trunk control and sitting balance. Will continue to follow and progress as able per POC.     Follow Up Recommendations  SNF     Equipment Recommendations  Hospital bed;Wheelchair cushion (measurements PT);Wheelchair (measurements PT);Other (comment) (18x18 tilt in space WC, hoyer lift)    Recommendations for Other Services        Precautions / Restrictions Precautions Precautions: Fall Precaution Comments: peg and trach Required Braces or Orthoses: Other Brace Other Brace: RLE soft knee brace and LUE soft elbow brace (wearing schedule in RN orders); bil PRAFOs Restrictions Weight Bearing Restrictions: No    Mobility  Bed Mobility                 Start Time: 1130 Angle: 60 degrees (Max angle) Total Minutes in Angle: 10 minutes Patient Response: Flat affect;Restless (Reaching for R knee)  Transfers                    Ambulation/Gait                 Stairs             Wheelchair Mobility    Modified Rankin (Stroke Patients Only) Modified Rankin (Stroke Patients Only) Pre-Morbid Rankin Score: No symptoms Modified Rankin: Severe disability     Balance               Standing balance comment: Pt strapped into tilt bed in standing. Noted pt able to keep head and shoulders mostly midline with mild R lateral lean over time.                            Cognition Arousal/Alertness: Awake/alert Behavior During Therapy: Flat affect Overall Cognitive Status: Impaired/Different from baseline Area of Impairment: Following commands;Problem solving  Orientation Level: Disoriented to;Place;Time;Situation (seems to recognize family, especially responsive to her husband, [name] when he is here) Current Attention Level: Focused (breif periods of sustained related to assisted self feeding) Memory: Decreased short-term memory Following Commands: Follows one step commands inconsistently;Follows one step commands with increased time Safety/Judgement: Decreased awareness of safety;Decreased awareness of deficits Awareness: Intellectual Problem Solving: Slow processing;Decreased initiation;Difficulty sequencing;Requires verbal cues;Requires tactile cues General Comments: pt reaching with R UE during mobilizations to R knee as if in discomfort and  decreases reach with relaxation, attempts to hold head up and turn head towards R side; scanning and tracking therapist with R eye. Pt gave thumbs up to command x2 this session with ~45 second delay. Nodded head yes when asked if recognized therapist, and shook head no at another time. Again, delayed responses and not consistent throughout session.      Exercises Other Exercises Other Exercises: Knee extension stretch R and L in preparation for standing in tilt bed. Other Exercises: L elbow stretch into extension Other Exercises: Keeping head turned  L; gentle R rotation stretch x 2 for 30 sec. Cervical extension stretch to neutral throughout session.    General Comments        Pertinent Vitals/Pain Pain Assessment: Faces Faces Pain Scale: Hurts even more Pain Location: R knee, and with cervical rotation stretch to the L. Pain Descriptors / Indicators: Grimacing;Guarding;Moaning Pain Intervention(s): Limited activity within patient's tolerance;Monitored during session;Repositioned;RN gave pain meds during session    Home Living                      Prior Function            PT Goals (current goals can now be found in the care plan section) Acute Rehab PT Goals Patient Stated Goal: Pt unable to state PT Goal Formulation: Patient unable to participate in goal setting Time For Goal Achievement: 12/14/20 Potential to Achieve Goals: Poor Progress towards PT goals: Progressing toward goals    Frequency    Min 2X/week      PT Plan Current plan remains appropriate    Co-evaluation PT/OT/SLP Co-Evaluation/Treatment: Yes (SLP) Reason for Co-Treatment: Complexity of the patient's impairments (multi-system involvement);Necessary to address cognition/behavior during functional activity;For patient/therapist safety;To address functional/ADL transfers PT goals addressed during session: Mobility/safety with mobility;Balance;Strengthening/ROM OT goals addressed during  session: ADL's and self-care      AM-PAC PT "6 Clicks" Mobility   Outcome Measure  Help needed turning from your back to your side while in a flat bed without using bedrails?: Total Help needed moving from lying on your back to sitting on the side of a flat bed without using bedrails?: Total Help needed moving to and from a bed to a chair (including a wheelchair)?: Total Help needed standing up from a chair using your arms (e.g., wheelchair or bedside chair)?: Total Help needed to walk in hospital room?: Total Help needed climbing 3-5 steps with a railing? : Total 6 Click Score: 6    End of Session   Activity Tolerance: Patient limited by pain Patient left: in bed;with call bell/phone within reach;with family/visitor present Nurse Communication: Mobility status;Other (comment) (Pt needs to be cleaned up - NT aware) PT Visit Diagnosis: Hemiplegia and hemiparesis;Other symptoms and signs involving the nervous system (R29.898);Other abnormalities of gait and mobility (R26.89) Hemiplegia - Right/Left: Right Hemiplegia - dominant/non-dominant: Dominant Hemiplegia - caused by: Cerebral infarction;Nontraumatic intracerebral hemorrhage;Other Nontraumatic intracranial hemorrhage     Time: 1100-1219 PT Time Calculation (  min) (ACUTE ONLY): 79 min  Charges:  $Therapeutic Exercise: 8-22 mins $Therapeutic Activity: 38-52 mins                     Conni Slipper, PT, DPT Acute Rehabilitation Services Pager: [phone] Office: [phone]    Marylynn Pearson 12/10/2020, 1:10 PM

## 2020-12-10 NOTE — Progress Notes (Signed)
[facility] PROGRESS NOTE  LAURANA MAGISTRO LZJ:[id number] DOB: 1965-10-19 DOA: 08/26/2020 PCP: Patient, [name] (Inactive)    October 08, 2020     10/16/2020                        10/16/2020     10/18/2020                        10/20/2020    Status: Remains inpatient appropriate because:Altered mental status, Unsafe d/c plan, IV treatments appropriate due to intensity of illness or inability to take PO and Inpatient level of care appropriate due to severity of illness   Dispo: The patient is from: Home              Anticipated d/c is to: SNF trach capable[place]-mental status remains variable and as of 5/23 unable to decannulate per trach team evaluation.              Patient currently is medically stable to d/c.   Difficult to place patient Yes   Level of care: Telemetry Medical  Code Status: Full Family Communication: [name] [phone]) is her POA and legal contact.  [name] was at bedside on 5/18 and given update DVT prophylaxis: Chronically bedbound.  5/6: We will discontinue prophylactic Lovenox   Vaccination status: Unvaccinated-have discussed both with patient's mother and with [name].  He wishes to speak to the rest of the family before making a decision but is leaning towards [name] vaccine.  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  Subsequently on 2/10, she developed obtundation  and respiratory arrest leading to intubation.  She has remained persistently obtunded, now has a trach and a PEG.  Hospital course complicated by persistent fever, Serratia pneumonia and now pre-orbital cellulitis  As of 3/25 with adjustments in hypertonicity medications patient began to improve regarding hypertonicity.  Eventually started on provigil with improvements in alertness.  Evaluated by rehab physician who recommended increasing provigil dose further.  Subsequently patient has become more alert.  Most days she is able to track and with encouragement able to follow simple commands especially since partial obstruction glasses placed.  See progress note dictated 4/27 for expanded details.   2/09 Admit for [place] 2/10 EVD placed, later intubated due to obtundation and respiratory arrest 2/11 MRI Brain, hypertonic saline d/c'd, EVD not working.  Husband did not consent to PICC  2/13 EVD removed 2/17 [place] discussion with family, PMT 2/24 Bedside trach and PEG 3/1 Transferred to [facility]  Subjective: Awake and primarily looking at Mother's Day card per her usual.  I walked directly in her field of vision and began interacting with her with a combination of touch and voice.  She be began looking at me and tracking with her right eye.  When discussing improvements in her status she raised both of her eyebrows and did this on  multiple occasions during delivery of information.  Family at bedside.  Objective: Vitals:   12/10/20 0725 12/10/20 0824  BP:  (!) 143/82  Pulse:  69  Resp:    Temp: 98.4 F (36.9 C)   SpO2:  97%    Intake/Output Summary (Last 24 hours) at 12/10/2020 0830 Last data filed at 12/10/2020 0645 Gross per 24 hour  Intake --  Output 800 ml  Net -800 ml   Filed Weights   12/08/20 0500 12/09/20 0500 12/10/20 0344  Weight: 50.6 kg 52.9 kg 55.3 kg    Exam:  Constitutional: Awake, alert, tracking with right eye.  No acute distress Respiratory: #4.0 cuffless trach, trach collar  with FiO2 21% at 5L, PMV in place.  Able to lift head off of bed at times.  This usually occurs with her leaning forward trying to use her right arm to touch her knee.  No tracheal secretions.  No witnessed coughing while I was at bedside.  Normal work of breathing. Cardiovascular: S1-S2, no peripheral edema, regular pulse. Abdomen:  PEG tube -LBM 5/23, soft nontender nondistended with normoactive bowel sound Musculoskeletal: Bilateral atrophy of thigh and calf muscles-there are over both knees Neurologic: Continues to have some mild left facial droop.  Has some movement of upper extremities right is more purposeful but remains extremely weak.  Able to lift head off of bed today purposefully while reaching forward to apparent painful knee.  Currently flexed at hips with knees flexed as well.  No spasticity seen.  Partial-occlusion glasses placed.  Continues to track with both eyes although right is more consistent and able to go past the midline. Psych: Awake.  Unable to follow simple commands.  Nonverbal.  Assessment/Plan: Acute problems: Multiple acute strokes  Right Thalamus ICH with IVH  Obstructive Hydrocephalus/Acute on persistent metabolic Encephalopathy/brain injury related hypertonicity -Continue baclofen and Provigil -Having issues with diarrhea/scheduled laxatives changed to as needed -Continue PT/OT/SLP -5/2, CIR MD reevaluated patient " Mrs. Calma unfortunately has not made any meaningful functional gains over the last month. She remains most appropriate for SNF level care".   Bilateral knee osteoarthritis -History of chronic knee pain secondary to osteoarthritis -Continue Tylenol as needed -5/26 we will add Voltaren gel  Klebsiella UTI -Resistant to nitrofurantoin and ampicillin -Meropenem transitioned to Rocephin on 5/22 -To minimize risk of recurrent UTI have asked that pure wick only be used at bedtime on a 10 PM to 8 AM schedule  Recurrent diarrhea -Likely combination  of recent antibiotics as well as scheduled laxative -MiraLAX now as needed  Hypertensive emergency/Malignant HTN -Due to financial constraints patient was not taking carvedilol and hydrochlorothiazide prior to admission -Continue Norvasc, Lopressor and hydralazine.   Acute hypoxemic respiratory failure secondary to CVA requiring mechanical ventilation (Resolved) Trach dependent -Downsized to #4 cuffless trach (5/8) -5/9: Reevaluated by trach team.  Again they reiterate patient is not a good candidate for decannulation "unless the family is prepared to transition to comfort should she deteriorate." -5/23 despite tolerating capping trials for 48 hours trach team has reevaluated this patient and have determined that she is profoundly weak, minimally responsive and from their standpoint confers a high risk of death from aspiration.  If CODE STATUS is DNR and family otherwise would not wish to have patient reintubated for respiratory failure after decannulation they would consider decannulating her (see above regarding evaluation on 5/9)  Dysphagia -Continue tube feedings, Prosource liquid, free water -Follow CBGs and provide SSI      Other problems: Right  upper extremity thrombophlebitis - [name] -> age indeterminate SVT of right cephalic vein  Neck Contracture/torticollis -- Resolved  ESBL positive Enterobacter UTI -Has completed 5 days of meropenem -Contact isolation during hospitalization -continue Oxybutynin suspected bladder spasms  Bacterial Conjunctivitis /periorbital cellulitis -Resolved  Chronic pansinusitis/recurrent fevers 2/2 aspiration PNA;History of Serratia Pneumonia  Hospital Acquired Pneumonia:  -Completed several courses of antibiotic -Follow-up imaging including sinus CT consistent with chronic but stable and resolving sinusitis   Anemia -Stable  HLD -Continue Lipitor      Data Reviewed: Basic Metabolic Panel: Recent Labs  Lab 12/04/20 1015  12/07/20 0451  NA 138 139  K 4.0 4.0  CL 102 100  CO2 30 31  GLUCOSE 131* 115*  BUN 17 15  CREATININE 0.54 0.48  CALCIUM 9.2 9.6  MG  --  2.0   Liver Function Tests: Recent Labs  Lab 12/04/20 1015  AST 24  ALT 38  ALKPHOS 109  BILITOT 0.2*  PROT 7.1  ALBUMIN 3.0*   CBC: Recent Labs  Lab 12/04/20 1015 12/07/20 0451  WBC 5.6 5.7  NEUTROABS 2.5  --   HGB 10.6* 10.5*  HCT 33.9* 33.5*  MCV 81.7 79.6*  PLT 324 315    CBG: Recent Labs  Lab 12/09/20 1709 12/09/20 1939 12/09/20 2356 12/10/20 0343 12/10/20 0758  GLUCAP 111* 114* 116* 118* 126*     Studies: No results found.  Scheduled Meds: . amLODipine  10 mg Per Tube Daily  . atorvastatin  40 mg Per Tube Daily  . baclofen  7.5 mg Per Tube 2 times per day  . chlorhexidine gluconate (MEDLINE KIT)  15 mL Mouth Rinse BID  . Chlorhexidine Gluconate Cloth  6 each Topical Daily  . feeding supplement (PROSource TF)  45 mL Per Tube Daily  . free water  100 mL Per Tube Q4H  . hydrALAZINE  50 mg Per Tube Q8H  . insulin aspart  0-9 Units Subcutaneous Q4H  . mouth rinse  15 mL Mouth Rinse 5 X Daily  . metoprolol tartrate  100 mg Per Tube BID  . modafinil  200 mg Per Tube Daily  . oxybutynin  5 mg Per Tube BID  . pantoprazole sodium  40 mg Per Tube BID  . tiZANidine  2 mg Per Tube QHS   Continuous Infusions: . sodium chloride Stopped (10/26/20 1445)  . cefTRIAXone (ROCEPHIN)  IV 1 g (12/09/20 1717)  . feeding supplement (OSMOLITE 1.2 CAL) 1,000 mL (12/10/20 0006)    Principal Problem:   ICH (intracerebral hemorrhage) ([place]) Active Problems:   Acute hypoxemic respiratory failure (HCC)   Status post tracheostomy ([place])   Palliative care by specialist   Muscle hypertonicity   Oropharyngeal dysphagia   Poor prognosis   Aspiration pneumonia of right lower lobe ([place])   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   Acute cystitis without  hematuria   Consultants:  Neurology   Neurosurgery.   Palliative care.    PCCM continues to follow.  No decannulation.   Procedures:  Tracheostomy  PEG tube  EEG  Echocardiogram  Antibiotics: Vancomycin 2/12-2/15 Maxipime 2/12-2/15 Ancef 2/19-2/27 Vancomycin 2/20 x 1 dose Maxipime 2/27- 3/5 Vancomycin 3/1-3/3 Ancef 3/6-3/14 Cipro 3/20 4-3/30 Unasyn 3/30-4/4 Rocephin 4/11-4/12 Meropenem 4/12-4/17   Time spent: 25 minutes    Erin Hearing ANP  [facility] 7 am - 330 pm/M-F for direct patient care and secure chat Please refer to [place] for contact info 105  Days

## 2020-12-11 LAB — GLUCOSE, CAPILLARY
Glucose-Capillary: 87 mg/dL (ref 70–99)
Glucose-Capillary: 143 mg/dL — ABNORMAL HIGH (ref 70–99)
Glucose-Capillary: 121 mg/dL — ABNORMAL HIGH (ref 70–99)
Glucose-Capillary: 83 mg/dL (ref 70–99)
Glucose-Capillary: 105 mg/dL — ABNORMAL HIGH (ref 70–99)

## 2020-12-11 MED ORDER — ACETAMINOPHEN 325 MG PO TABS
650.0000 mg | ORAL_TABLET | ORAL | Status: AC
  Administered 2020-12-11: 650 mg via ORAL
  Filled 2020-12-11: qty 2

## 2020-12-11 MED ORDER — ACETAMINOPHEN 650 MG RE SUPP: 650.0000 mg | RECTAL | Status: AC

## 2020-12-11 MED ORDER — ACETAMINOPHEN 160 MG/5ML PO SOLN
650.0000 mg | ORAL | Status: AC
  Administered 2020-12-11 – 2020-12-18 (×35): 650 mg
  Filled 2020-12-11 (×37): qty 20.3

## 2020-12-11 MED ORDER — PANTOPRAZOLE SODIUM 40 MG PO PACK
40.0000 mg | PACK | Freq: Every day | ORAL | Status: AC
  Administered 2020-12-12 – 2020-12-20 (×9): 40 mg
  Filled 2020-12-11 (×9): qty 20

## 2020-12-11 MED ORDER — IBUPROFEN 100 MG/5ML PO SUSP
400.0000 mg | Freq: Three times a day (TID) | ORAL | Status: DC
  Administered 2020-12-11 – 2020-12-12 (×3): 400 mg via ORAL
  Filled 2020-12-11 (×6): qty 20

## 2020-12-11 NOTE — Progress Notes (Signed)
[facility] PROGRESS NOTE  Charlene [name] HRC:[id number] DOB: [birth year] DOA: 08/26/2020 PCP: Patient, [name] (Inactive)    October 08, 2020     10/16/2020                        10/16/2020     10/18/2020                        10/20/2020    Status: Remains inpatient appropriate because:Altered mental status, Unsafe d/c plan, IV treatments appropriate due to intensity of illness or inability to take PO and Inpatient level of care appropriate due to severity of illness   Dispo: The patient is from: Home              Anticipated d/c is to: SNF trach capable-[place] but no beds available-mental status remains variable and as of 5/23 unable to decannulate per trach team evaluation.              Patient currently is medically stable to d/c.   Difficult to place patient Yes   Level of care: Telemetry Medical  Code Status: Full Family Communication: [name] [phone]) is her POA and legal contact.  [name] was at bedside on 5/18 and given update DVT prophylaxis: Chronically bedbound.  5/6: We will discontinue prophylactic Lovenox   Vaccination status: Unvaccinated-have discussed both with patient's mother and with [name].  He wishes to speak to the rest of the family before making a decision but is leaning towards [name] vaccine.  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  Subsequently on 2/10, she developed obtundation  and respiratory arrest leading to intubation.  She has remained persistently obtunded, now has a trach and a PEG.  Hospital course complicated by persistent fever, Serratia pneumonia and now pre-orbital cellulitis  As of 3/25 with adjustments in hypertonicity medications patient began to improve regarding hypertonicity.  Eventually started on provigil with improvements in alertness.  Evaluated by rehab physician who recommended increasing provigil dose further.  Subsequently patient has become more alert.  Most days she is able to track and with encouragement able to follow simple commands especially since partial obstruction glasses placed.  See progress note dictated 4/27 for expanded details.   2/09 Admit for [place] 2/10 EVD placed, later intubated due to obtundation and respiratory arrest 2/11 MRI Brain, hypertonic saline d/c'd, EVD not working.  Husband did not consent to PICC  2/13 EVD removed 2/17 [place] discussion with family, PMT 2/24 Bedside trach and PEG 3/1 Transferred to [facility]  Subjective: Alert.  Nonverbal but humming/moaning and appears to be attempting to sing to music playing in the room.  Very active using arm and bringing towards mouth and making chewing motions.  Of note patient with assistance of SLP/OT had utilized spoon to feed self pudding yesterday.  Objective: Vitals:   12/11/20 0349 12/11/20 0455  BP:  (!) 149/92  Pulse: 83 84  Resp: 20 20  Temp:  98.6 F (37 C)  SpO2: 99% 99%    Intake/Output Summary (Last 24 hours) at 12/11/2020 0826 Last data filed at 12/11/2020 0651 Gross per 24 hour  Intake 6105 ml  Output 600 ml  Net 5505 ml   Filed Weights   12/09/20 0500 12/10/20 0344 12/11/20 0457  Weight: 52.9 kg 55.3 kg 51 kg    Exam:  Constitutional: Awake and alert.  Tracking around room.  No acute distress Respiratory: #4.0 cuffless trach, trach collar with FiO2 21% at 5L, anterior lung sounds are clear.  No tracheal secretions.  No increased work of  breathing Cardiovascular: S1-S2, no tachycardia, no peripheral edema Abdomen:  PEG tube -LBM 5/23, soft nontender nondistended with normoactive bowel sounds Musculoskeletal: Bilateral atrophy of thigh and calf muscles-there are over both knees Neurologic: ough right is more consistent and able to go past the midline. Psych: Awake.  Appears to be aware of persons in room and responds best to family especially her mother.  Essentially nonverbal with only moaning or humming sounds and no definitive phonation so unable to accurately assess orientation  Assessment/Plan: Acute problems: Multiple acute strokes  Right Thalamus ICH with IVH  Obstructive Hydrocephalus/Acute on persistent metabolic Encephalopathy/brain injury related hypertonicity -Continue baclofen and Provigil -Having issues with diarrhea/scheduled laxatives changed to as needed -Continue PT/OT/SLP -5/2, CIR MD reevaluated patient " Charlene [name] unfortunately has not made any meaningful functional gains over the last month. She remains most appropriate for SNF level care".   Bilateral knee osteoarthritis -History of chronic knee pain secondary to osteoarthritis -Continues to reach toward right knee and grimaces -Continue Voltaren gel -Change Tylenol to scheduled x7 days and add ibuprofen with PPI scheduled x7 days  Klebsiella UTI -Resistant to nitrofurantoin and ampicillin -Meropenem transitioned to Rocephin on 5/22 -To minimize risk of recurrent UTI have asked that pure wick only be used at bedtime on a 10 PM to 8 AM schedule  Recurrent diarrhea -Likely combination of recent antibiotics as well as scheduled laxative -MiraLAX now as needed  Hypertensive emergency/Malignant HTN -Due to financial constraints patient was not taking carvedilol and hydrochlorothiazide prior to admission -Continue Norvasc, Lopressor and hydralazine.   Acute hypoxemic respiratory failure secondary to CVA requiring mechanical ventilation  (Resolved) Trach dependent -Downsized to #4 cuffless trach (5/8) -5/23 despite tolerating capping trials for 48 hours trach team has reevaluated this patient and have determined that she is profoundly weak, minimally responsive and from their standpoint confers a high risk of death from aspiration.  If CODE STATUS is DNR and family otherwise would not wish to have patient reintubated for respiratory failure after decannulation they would consider decannulating her (see above regarding evaluation on 5/9)  Dysphagia -Continue tube feedings, Prosource liquid, free water -Follow CBGs and provide SSI      Other problems: Right upper extremity thrombophlebitis - [name] -> age indeterminate SVT of right cephalic vein  Neck Contracture/torticollis -- Resolved  ESBL positive Enterobacter UTI -Has completed 5 days of meropenem -Contact isolation during hospitalization -continue Oxybutynin suspected bladder spasms  Bacterial Conjunctivitis /periorbital cellulitis -Resolved  Chronic pansinusitis/recurrent fevers 2/2 aspiration PNA;History of Serratia Pneumonia  Hospital Acquired Pneumonia:  -Completed several courses of antibiotic -Follow-up imaging including sinus CT consistent with chronic but stable and resolving sinusitis   Anemia -Stable  HLD -Continue Lipitor      Data Reviewed: Basic Metabolic Panel: Recent Labs  Lab 12/04/20 1015 12/07/20 0451  NA 138 139  K 4.0 4.0  CL 102 100  CO2 30 31  GLUCOSE 131* 115*  BUN 17 15  CREATININE 0.54 0.48  CALCIUM 9.2 9.6  MG  --  2.0   Liver Function Tests: Recent Labs  Lab 12/04/20 1015  AST 24  ALT 38  ALKPHOS 109  BILITOT 0.2*  PROT 7.1  ALBUMIN 3.0*   CBC: Recent Labs  Lab 12/04/20 1015 12/07/20 0451  WBC 5.6 5.7  NEUTROABS 2.5  --   HGB 10.6* 10.5*  HCT 33.9* 33.5*  MCV 81.7 79.6*  PLT 324 315    CBG: Recent Labs  Lab 12/10/20 1552 12/10/20 1935 12/10/20 2353 12/11/20 0326 12/11/20 0806  GLUCAP  120* 141* 133* 87 143*     Studies: No results found.  Scheduled Meds: . amLODipine  10 mg Per Tube Daily  . atorvastatin  40 mg Per Tube Daily  . baclofen  7.5 mg Per Tube 2 times per day  . chlorhexidine gluconate (MEDLINE KIT)  15 mL Mouth Rinse BID  . Chlorhexidine Gluconate Cloth  6 each Topical Daily  . diclofenac Sodium  4 g Topical QID  . feeding supplement (PROSource TF)  45 mL Per Tube Daily  . free water  100 mL Per Tube Q4H  . hydrALAZINE  50 mg Per Tube Q8H  . insulin aspart  0-9 Units Subcutaneous Q4H  . mouth rinse  15 mL Mouth Rinse 5 X Daily  . metoprolol tartrate  100 mg Per Tube BID  . modafinil  200 mg Per Tube Daily  . oxybutynin  5 mg Per Tube BID  . pantoprazole sodium  40 mg Per Tube BID  . tiZANidine  2 mg Per Tube QHS   Continuous Infusions: . sodium chloride Stopped (10/26/20 1445)  . feeding supplement (OSMOLITE 1.2 CAL) 1,000 mL (12/10/20 1712)    Principal Problem:   ICH (intracerebral hemorrhage) ([place]) Active Problems:   Acute hypoxemic respiratory failure (HCC)   Status post tracheostomy ([place])   Palliative care by specialist   Muscle hypertonicity   Oropharyngeal dysphagia   Poor prognosis   Aspiration pneumonia of right lower lobe ([place])   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   Acute cystitis without hematuria   Consultants:  Neurology   Neurosurgery.   Palliative care.    PCCM continues to follow.  No decannulation.   Procedures:  Tracheostomy  PEG tube  EEG  Echocardiogram  Antibiotics: Vancomycin 2/12-2/15 Maxipime 2/12-2/15 Ancef 2/19-2/27 Vancomycin 2/20 x 1 dose Maxipime 2/27- 3/5 Vancomycin 3/1-3/3 Ancef 3/6-3/14 Cipro 3/20 4-3/30 Unasyn 3/30-4/4 Rocephin 4/11-4/12 Meropenem 4/12-4/17   Time spent: 25 minutes    Erin Hearing ANP  [facility] 7 am - 330 pm/M-F for direct patient care and secure chat Please refer to [place] for  contact info 106  Days

## 2020-12-11 NOTE — Progress Notes (Addendum)
Nutrition Follow-up  DOCUMENTATION CODES:  Not applicable  INTERVENTION:  Continue Tube feeding via PEG tube: -Osmolite 1.2 at 55 ml/h (1320 ml per day) -45 ml ProSource TF daily  Provides 1624 kcal, 84 gm protein, 1070 ml free water daily  -100 ml free water every 4 hours    NUTRITION DIAGNOSIS:  Inadequate oral intake related to inability to eat as evidenced by NPO status. -- ongoing  GOAL:  Patient will meet greater than or equal to 90% of their needs -- Met with TF  MONITOR:  TF tolerance,Vent status  REASON FOR ASSESSMENT:  Consult,Ventilator Enteral/tube feeding initiation and management  ASSESSMENT:  Pt with PMH significant for HTN admitted with with 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  Subsequently on 2/10, pt developed obtundation and respiratory arrest leading to intubation. Pt is now s/p trach/PEG placement. Hospital course complicated by Serratia pneumonia, ESBL Enterobacter UTI and pre-orbital cellulitis.  2/10 s/p EVD placement and intubation for respiratory arrest 2/13 EVD removed 2/17 palliative care consult; family requesting tx to [facility]  2/24 s/p trach and PEG  3/1 tx to [facility] 3/11 trach changed to shiley #6 cuffless 4/08 pt w/ sepsis 2/2 UTI 4/18 28% ATC/5L 5/2 CIR reevaluated pt and determined she has not made enough improvement for admit; recommended SNF 5/8 trach changed to shiley #4 cuffless   Per MD, pt remains medically stable for discharge. Anticipated d/c is to SNF (trach capable). [place] available.   Pt's mental status remains variable and as of 5/23 unable to decannulate per CCM  Pt remains nonverbal. Continues to tolerate TF via PEG per RN. Current regimen: Osmolite 1.2 at 55 ml/h w/ 45 ml ProSource TF daily, 172m free water flushes Q4H   UOP: [id number] hours  Stool: 1x unmeasured stool occurrence  today  Medications: SSI Q4H, protonix Labs reviewed. CBGs [id number] Order:   Diet Order            Diet NPO time specified  Diet effective now                EDUCATION NEEDS:  No education needs have been identified at this time  Skin:  Skin Assessment: Reviewed RN Assessment (MASD bilateral groin)  Last BM:  5/27 type 7  Height:  Ht Readings from Last 1 Encounters:  10/18/20 _0  (1.651 m)   Weight:  Wt Readings from Last 1 Encounters:  12/11/20 51 kg   Ideal Body Weight:  56.8 kg  BMI:  Body mass index is 18.71 kg/m.  Estimated Nutritional Needs:  Kcal:  1600-1800 Protein:  80-90 grams Fluid:  >1.6L/d    AmLarkin InaMS, RD, LDN RD pager number and weekend/on-call pager number located in [facility]

## 2020-12-12 LAB — GLUCOSE, CAPILLARY
Glucose-Capillary: 110 mg/dL — ABNORMAL HIGH (ref 70–99)
Glucose-Capillary: 121 mg/dL — ABNORMAL HIGH (ref 70–99)
Glucose-Capillary: 124 mg/dL — ABNORMAL HIGH (ref 70–99)
Glucose-Capillary: 136 mg/dL — ABNORMAL HIGH (ref 70–99)
Glucose-Capillary: 149 mg/dL — ABNORMAL HIGH (ref 70–99)
Glucose-Capillary: 150 mg/dL — ABNORMAL HIGH (ref 70–99)
Glucose-Capillary: 99 mg/dL (ref 70–99)

## 2020-12-12 MED ORDER — IBUPROFEN 100 MG/5ML PO SUSP
400.0000 mg | Freq: Three times a day (TID) | ORAL | Status: AC
  Administered 2020-12-13 – 2020-12-18 (×15): 400 mg
  Filled 2020-12-12 (×16): qty 20

## 2020-12-12 MED ORDER — OXYBUTYNIN CHLORIDE 5 MG PO TABS
5.0000 mg | ORAL_TABLET | Freq: Two times a day (BID) | ORAL | Status: DC
  Administered 2020-12-12 – 2020-12-16 (×10): 5 mg
  Filled 2020-12-12 (×11): qty 1

## 2020-12-12 NOTE — Progress Notes (Signed)
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  Patient remains obtunded/altered.  Currently has PEG and trach in place.  Hospital course complicated by persistent fever, Serratia pneumonia and preorbital cellulitis, ESBL Enterobacter UTI.  Patient with hypertonicity, adjustments to medications started in March.  Currently on Provigil to help with alertness.  Patient was evaluated by PMR physician, Provigil dose was increased.  Continues to have minimal interaction.  Currently pending placement. 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  Downsized to #4 cuffless on 5/8 -PCCM/trach team continues to follow-currently she is not a candidate for decannulation given her inability to manage her oral secretions -Patient was last seen on 5/23, capping trials were attempted for 48 hours however patient was found to be profoundly weak and minimally responsive, and is at high risk of high risk of death from aspiration  Dysphagia -Continue tube feeds, Prosource and free water flushes  Bilateral knee osteoarthritis -Has been a chronic problem, treated with Tylenol and voltaren gel -ibuprofen added along with PPI   Recurrent  diarrhea -Suspect due to combination of antibiotics, tube feeds, as well as laxatives  which have now been changed to as needed  Hypertensive emergency/malignant hypertension -Patient was not taking her medications prior to admission (Coreg, HCTZ) -BP stable, continue amlodipine, metoprolol, hydralazine  Right upper extremity thrombophlebitis -Ultrasound showed age-indeterminate SVT of the right cephalic vein  Neck contracture/torticollis -Resolved  ESBL Enterobacter UTI (4/8) / Klebsiella pneumoniae UTI (5/20) -Klebsiella resistant to nitrofurantoin and ampicillin -Patient completed 5 days of Merrem -Continue oxybutynin for bladder spasm  Bacterial conjunctivitis/periorbital cellulitis -Resolved  Chronic pansinusitis/aspiration pneumonia with recurrent fevers -Hospital-acquired -Patient has completed several courses of antibiotics during hospitalization -CT noted chronic but stable and resolving sinusitis  Chronic Anemia -Stable, last hemoglobin 10.5 (5/23) -Continue to monitor CBC periodically  Hyperlipidemia -Continue statin  DVT Prophylaxis  SCDs  Code Status: Full  Family Communication: None at bedside  Disposition Plan:  Status is: Inpatient  Remains inpatient appropriate because:Unsafe d/c plan   Dispo: The patient is from: Home              Anticipated d/c is to: SNF              Patient currently is not medically stable to d/c.   Difficult to place patient Yes    Consultants Neurology  [facility] PCCM Palliative care  Procedures  Tracheostomy PEG tube placement EEG Echocardiogram  Antibiotics   Anti-infectives (From admission, onward)   Start     Dose/Rate Route Frequency Ordered Stop   12/06/20 1500  cefTRIAXone (ROCEPHIN) 1 g in sodium chloride 0.9 % 100 mL IVPB        1 g 200 mL/hr over 30 Minutes Intravenous Every 24 hours 12/06/20 1404 12/10/20 1500   12/04/20 1400  meropenem (MERREM) 1 g in sodium chloride  0.9 % 100 mL IVPB   Status:  Discontinued        1 g 200 mL/hr over 30 Minutes Intravenous Every 8 hours 12/04/20 1207 12/06/20 1404   10/27/20 0845  meropenem (MERREM) 1 g in sodium chloride 0.9 % 100 mL IVPB  Status:  Discontinued        1 g 200 mL/hr over 30 Minutes Intravenous Every 8 hours 10/27/20 0752 11/01/20 0720   10/26/20 1145  cefTRIAXone (ROCEPHIN) 1 g in sodium chloride 0.9 % 100 mL IVPB  Status:  Discontinued        1 g 200 mL/hr over 30 Minutes Intravenous Every 24 hours 10/26/20 1056 10/27/20 0752   10/14/20 1815  Ampicillin-Sulbactam (UNASYN) 3 g in sodium chloride 0.9 % 100 mL IVPB        3 g 200 mL/hr over 30 Minutes Intravenous Every 6 hours 10/14/20 1754 10/19/20 1340   10/08/20 0845  ciprofloxacin (CIPRO) tablet 500 mg  Status:  Discontinued        500 mg Per Tube 2 times daily 10/08/20 0754 10/14/20 1731   09/21/20 1545  ceFAZolin (ANCEF) IVPB 1 g/50 mL premix        1 g 100 mL/hr over 30 Minutes Intravenous Every 8 hours 09/21/20 1457 09/28/20 0647   09/20/20 0600  ceFAZolin (ANCEF) IVPB 1 g/50 mL premix  Status:  Discontinued        1 g 100 mL/hr over 30 Minutes Intravenous Every 8 hours 09/14/20 0903 09/14/20 0904   09/16/20 1300  vancomycin (VANCOREADY) IVPB 1250 mg/250 mL  Status:  Discontinued        1,250 mg 166.7 mL/hr over 90 Minutes Intravenous Every 24 hours 09/15/20 1232 09/17/20 0956   09/15/20 1230  vancomycin (VANCOREADY) IVPB 1250 mg/250 mL        1,250 mg 166.7 mL/hr over 90 Minutes Intravenous  Once 09/15/20 1131 09/15/20 1402   09/13/20 1400  ceFEPIme (MAXIPIME) 2 g in sodium chloride 0.9 % 100 mL IVPB        2 g 200 mL/hr over 30 Minutes Intravenous Every 8 hours 09/13/20 1341 09/19/20 2208   09/06/20 0600  vancomycin (VANCOREADY) IVPB 1250 mg/250 mL        1,250 mg 166.7 mL/hr over 90 Minutes Intravenous  Once 09/06/20 0541 09/06/20 0739   09/05/20 1400  ceFAZolin (ANCEF) IVPB 1 g/50 mL premix  Status:  Discontinued        1 g 100 mL/hr over 30 Minutes  Intravenous Every 8 hours 09/05/20 1120 09/13/20 1341   08/30/20 0600  vancomycin (VANCOREADY) IVPB 500 mg/100 mL  Status:  Discontinued        500 mg 100 mL/hr over 60 Minutes Intravenous Every 12 hours 08/29/20 1554 09/01/20 1115   08/29/20 1700  ceFEPIme (MAXIPIME) 2 g in sodium chloride 0.9 % 100 mL IVPB  Status:  Discontinued        2 g 200 mL/hr over 30 Minutes Intravenous Every 12 hours 08/29/20 1554 09/01/20 1115   08/29/20 1645  vancomycin (VANCOREADY) IVPB 1250 mg/250 mL        1,250 mg 166.7 mL/hr over 90 Minutes Intravenous  Once 08/29/20 1554 08/29/20 1820      Subjective:   Danella Philson seen and examined today.  Minimally tracking, nonverbal today.   Objective:   Vitals:   12/12/20 0450 12/12/20 0500 12/12/20 0844 12/12/20 1142  BP:   (!) 162/79   Pulse: 72  94 80  Resp: '15  20 20  ' Temp:      TempSrc:      SpO2: 99%  97% 97%  Weight:  52.2 kg    Height:        Intake/Output Summary (Last 24 hours) at 12/12/2020 1215 Last data filed at 12/12/2020 0400 Gross per 24 hour  Intake 960 ml  Output --  Net 960 ml   Filed Weights   12/10/20 0344 12/11/20 0457 12/12/20 0500  Weight: 55.3 kg 51 kg 52.2 kg    Exam  General: Well developed, chronically ill-appearing, NAD  HEENT: NCAT, mucous membranes moist.   Neck: Trach in place  Cardiovascular: S1 S2 auscultated, no rubs, murmurs or gallops. Regular rate and rhythm.  Respiratory: Diminished breath sounds anteriorly  Abdomen: Soft, nontender, nondistended, + bowel sounds, PEG tube in place  Extremities: warm dry without cyanosis clubbing or edema.  Bilateral atrophy of thigh and calf muscles  Neuro: Awake however cannot assess alertness or orientation at this time.  Patient minimally tracking and not following commands at this time.  Data Reviewed: I have personally reviewed following labs and imaging studies  CBC: Recent Labs  Lab 12/07/20 0451  WBC 5.7  HGB 10.5*  HCT 33.5*  MCV 79.6*  PLT  924   Basic Metabolic Panel: Recent Labs  Lab 12/07/20 0451  NA 139  K 4.0  CL 100  CO2 31  GLUCOSE 115*  BUN 15  CREATININE 0.48  CALCIUM 9.6  MG 2.0   GFR: Estimated Creatinine Clearance: 67 mL/min (by C-G formula based on SCr of 0.48 mg/dL). Liver Function Tests: No results for input(s): AST, ALT, ALKPHOS, BILITOT, PROT, ALBUMIN in the last 168 hours. No results for input(s): LIPASE, AMYLASE in the last 168 hours. No results for input(s): AMMONIA in the last 168 hours. Coagulation Profile: No results for input(s): INR, PROTIME in the last 168 hours. Cardiac Enzymes: No results for input(s): CKTOTAL, CKMB, CKMBINDEX, TROPONINI in the last 168 hours. BNP (last 3 results) No results for input(s): PROBNP in the last 8760 hours. HbA1C: No results for input(s): HGBA1C in the last 72 hours. CBG: Recent Labs  Lab 12/11/20 2003 12/12/20 0023 12/12/20 0433 12/12/20 0806 12/12/20 1202  GLUCAP 105* 150* 99 136* 124*   Lipid Profile: No results for input(s): CHOL, HDL, LDLCALC, TRIG, CHOLHDL, LDLDIRECT in the last 72 hours. Thyroid Function Tests: No results for input(s): TSH, T4TOTAL, FREET4, T3FREE, THYROIDAB in the last 72 hours. Anemia Panel: No results for input(s): VITAMINB12, FOLATE, FERRITIN, TIBC, IRON, RETICCTPCT in the last 72 hours. Urine analysis:    Component Value Date/Time   COLORURINE STRAW (A) 12/04/2020 1030   APPEARANCEUR TURBID (A) 12/04/2020 1030   LABSPEC 1.010 12/04/2020 1030   PHURINE 7.0 12/04/2020 1030   GLUCOSEU NEGATIVE 12/04/2020 1030   HGBUR NEGATIVE 12/04/2020 1030   BILIRUBINUR NEGATIVE 12/04/2020 1030   KETONESUR NEGATIVE 12/04/2020 1030   PROTEINUR NEGATIVE 12/04/2020 1030   UROBILINOGEN 0.2 02/21/2014 1629   NITRITE NEGATIVE 12/04/2020 1030   LEUKOCYTESUR TRACE (A) 12/04/2020 1030   Sepsis Labs: '@LABRCNTIP' (procalcitonin:4,lacticidven:4)  ) Recent Results (from the past 240 hour(s))  Culture, Urine     Status: Abnormal    Collection Time: 12/04/20  9:07 AM   Specimen: Urine, Catheterized  Result Value Ref Range Status   Specimen Description URINE, CATHETERIZED  Final   Special Requests   Final    NONE Performed at [facility], [place] [address] [place],  [address]  Culture >=100,000 COLONIES/mL KLEBSIELLA PNEUMONIAE (A)  Final   Report Status 12/06/2020 FINAL  Final   Organism ID, Bacteria KLEBSIELLA PNEUMONIAE (A)  Final      Susceptibility   Klebsiella pneumoniae - MIC*    AMPICILLIN >=32 RESISTANT Resistant     CEFAZOLIN <=4 SENSITIVE Sensitive     CEFEPIME <=0.12 SENSITIVE Sensitive     CEFTRIAXONE <=0.25 SENSITIVE Sensitive     CIPROFLOXACIN 0.5 SENSITIVE Sensitive     GENTAMICIN <=1 SENSITIVE Sensitive     IMIPENEM <=0.25 SENSITIVE Sensitive     NITROFURANTOIN 128 RESISTANT Resistant     TRIMETH/SULFA <=20 SENSITIVE Sensitive     AMPICILLIN/SULBACTAM 8 SENSITIVE Sensitive     PIP/TAZO 8 SENSITIVE Sensitive     * >=100,000 COLONIES/mL KLEBSIELLA PNEUMONIAE  Resp Panel by RT-PCR (Flu A&B, Covid) Nasopharyngeal Swab     Status: None   Collection Time: 12/04/20 10:10 AM   Specimen: Nasopharyngeal Swab; Nasopharyngeal(NP) swabs in vial transport medium  Result Value Ref Range Status   SARS Coronavirus 2 by RT PCR NEGATIVE NEGATIVE Final    Comment: (NOTE) SARS-CoV-2 target nucleic acids are NOT DETECTED.  The SARS-CoV-2 RNA is generally detectable in upper respiratory specimens during the acute phase of infection. The lowest concentration of SARS-CoV-2 viral copies this assay can detect is 138 copies/mL. A negative result does not preclude SARS-Cov-2 infection and should not be used as the sole basis for treatment or other patient management decisions. A negative result may occur with  improper specimen collection/handling, submission of specimen other than nasopharyngeal swab, presence of viral mutation(s) within the areas targeted by this assay, and inadequate number of  viral copies(<138 copies/mL). A negative result must be combined with clinical observations, patient history, and epidemiological information. The expected result is Negative.  Fact Sheet for Patients:  EntrepreneurPulse.com.au  Fact Sheet for Healthcare Providers:  IncredibleEmployment.be  This test is no t yet approved or cleared by the [place] FDA and  has been authorized for detection and/or diagnosis of SARS-CoV-2 by FDA under an Emergency Use Authorization (EUA). This EUA will remain  in effect (meaning this test can be used) for the duration of the COVID-19 declaration under Section 564(b)(1) of the Act, 21 U.S.C.section 360bbb-3(b)(1), unless the authorization is terminated  or revoked sooner.       Influenza A by PCR NEGATIVE NEGATIVE Final   Influenza B by PCR NEGATIVE NEGATIVE Final    Comment: (NOTE) The Xpert Xpress SARS-CoV-2/FLU/RSV plus assay is intended as an aid in the diagnosis of influenza from Nasopharyngeal swab specimens and should not be used as a sole basis for treatment. Nasal washings and aspirates are unacceptable for Xpert Xpress SARS-CoV-2/FLU/RSV testing.  Fact Sheet for Patients: EntrepreneurPulse.com.au  Fact Sheet for Healthcare Providers: IncredibleEmployment.be  This test is not yet approved or cleared by the [place] FDA and has been authorized for detection and/or diagnosis of SARS-CoV-2 by FDA under an Emergency Use Authorization (EUA). This EUA will remain in effect (meaning this test can be used) for the duration of the COVID-19 declaration under Section 564(b)(1) of the Act, 21 U.S.C. section 360bbb-3(b)(1), unless the authorization is terminated or revoked.  Performed at [facility], [place] [address] [place], [place] [address]   Culture, blood (Routine X 2) w Reflex to ID Panel     Status: None   Collection Time: 12/04/20 10:14 AM    Specimen: BLOOD LEFT HAND  Result Value Ref Range Status   Specimen Description  BLOOD LEFT HAND  Final   Special Requests   Final    BOTTLES DRAWN AEROBIC AND ANAEROBIC Blood Culture adequate volume   Culture   Final    NO GROWTH 5 DAYS Performed at [facility], [address] [place], [place] [address]    Report Status 12/09/2020 FINAL  Final  Culture, blood (Routine X 2) w Reflex to ID Panel     Status: None   Collection Time: 12/04/20 10:16 AM   Specimen: BLOOD  Result Value Ref Range Status   Specimen Description BLOOD RIGHT ANTECUBITAL  Final   Special Requests   Final    BOTTLES DRAWN AEROBIC AND ANAEROBIC Blood Culture adequate volume   Culture   Final    NO GROWTH 5 DAYS Performed at [facility], [place] [address], [place], [place] [address]    Report Status 12/09/2020 FINAL  Final      Radiology Studies: No results found.   Scheduled Meds: . acetaminophen  650 mg Oral Q4H   Or  . acetaminophen (TYLENOL) oral liquid 160 mg/5 mL  650 mg Per Tube Q4H   Or  . acetaminophen  650 mg Rectal Q4H  . amLODipine  10 mg Per Tube Daily  . atorvastatin  40 mg Per Tube Daily  . baclofen  7.5 mg Per Tube 2 times per day  . chlorhexidine gluconate (MEDLINE KIT)  15 mL Mouth Rinse BID  . Chlorhexidine Gluconate Cloth  6 each Topical Daily  . diclofenac Sodium  4 g Topical QID  . feeding supplement (PROSource TF)  45 mL Per Tube Daily  . free water  100 mL Per Tube Q4H  . hydrALAZINE  50 mg Per Tube Q8H  . ibuprofen  400 mg Oral Q8H  . insulin aspart  0-9 Units Subcutaneous Q4H  . mouth rinse  15 mL Mouth Rinse 5 X Daily  . metoprolol tartrate  100 mg Per Tube BID  . modafinil  200 mg Per Tube Daily  . oxybutynin  5 mg Per Tube BID  . pantoprazole sodium  40 mg Per Tube Daily  . tiZANidine  2 mg Per Tube QHS   Continuous Infusions: . sodium chloride Stopped (10/26/20 1445)  . feeding supplement (OSMOLITE 1.2 CAL) 1,000 mL (12/12/20 0626)     LOS: 107 days    Time Spent in minutes   30 minutes  Tashay Bozich D.O. on 12/12/2020 at 12:15 PM  Between 7am to 7pm - Please see pager noted on [email or web address]  After 7pm go to [email or web address]  And look for the night coverage person covering for me after hours  [facility] Office  [phone]

## 2020-12-13 LAB — GLUCOSE, CAPILLARY
Glucose-Capillary: 101 mg/dL — ABNORMAL HIGH (ref 70–99)
Glucose-Capillary: 110 mg/dL — ABNORMAL HIGH (ref 70–99)
Glucose-Capillary: 111 mg/dL — ABNORMAL HIGH (ref 70–99)
Glucose-Capillary: 125 mg/dL — ABNORMAL HIGH (ref 70–99)
Glucose-Capillary: 134 mg/dL — ABNORMAL HIGH (ref 70–99)
Glucose-Capillary: 178 mg/dL — ABNORMAL HIGH (ref 70–99)

## 2020-12-13 NOTE — Progress Notes (Signed)
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  Patient remains obtunded/altered.  Currently has PEG and trach in place.  Hospital course complicated by persistent fever, Serratia pneumonia and preorbital cellulitis, ESBL Enterobacter UTI.  Patient with hypertonicity, adjustments to medications started in March.  Currently on Provigil to help with alertness.  Patient was evaluated by PMR physician, Provigil dose was increased.  Continues to have minimal interaction.  Currently pending placement. 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  Downsized to #4 cuffless on 5/8 -PCCM/trach team continues to follow-currently she is not a candidate for decannulation given her inability to manage her oral secretions -Patient was last seen on 5/23, capping trials were attempted for 48 hours however patient was found to be profoundly weak and minimally responsive, and is at high risk of high risk of death from aspiration  Dysphagia -Continue tube feeds, Prosource and free water flushes  Bilateral knee osteoarthritis -Has been a chronic problem, treated with Tylenol and voltaren gel -ibuprofen added along with PPI    Recurrent diarrhea -Suspect due to combination of antibiotics, tube feeds, as well as laxatives  which have now been changed to as needed  Hypertensive emergency/malignant hypertension -Patient was not taking her medications prior to admission (Coreg, HCTZ) -BP stable, continue amlodipine, metoprolol, hydralazine  Right upper extremity thrombophlebitis -Ultrasound showed age-indeterminate SVT of the right cephalic vein  Neck contracture/torticollis -Resolved  ESBL Enterobacter UTI (4/8) / Klebsiella pneumoniae UTI (5/20) -Klebsiella resistant to nitrofurantoin and ampicillin -Patient completed 5 days of Merrem -Continue oxybutynin for bladder spasm  Bacterial conjunctivitis/periorbital cellulitis -Resolved  Chronic pansinusitis/aspiration pneumonia with recurrent fevers -Hospital-acquired -Patient has completed several courses of antibiotics during hospitalization -CT noted chronic but stable and resolving sinusitis  Chronic Anemia -Stable, last hemoglobin 10.5 (5/23) -Continue to monitor CBC periodically  Hyperlipidemia -Continue statin  DVT Prophylaxis  SCDs  Code Status: Full  Family Communication: None at bedside  Disposition Plan:  Status is: Inpatient  Remains inpatient appropriate because:Unsafe d/c plan   Dispo: The patient is from: Home              Anticipated d/c is to: SNF              Patient currently is not medically stable to d/c.   Difficult to place patient Yes    Consultants Neurology  [facility] PCCM Palliative care  Procedures  Tracheostomy PEG tube placement EEG Echocardiogram  Antibiotics   Anti-infectives (From admission, onward)   Start     Dose/Rate Route Frequency Ordered Stop   12/06/20 1500  cefTRIAXone (ROCEPHIN) 1 g in sodium chloride 0.9 % 100 mL IVPB        1 g 200 mL/hr over 30 Minutes Intravenous Every 24 hours 12/06/20 1404 12/10/20 1500   12/04/20 1400  meropenem (MERREM)  1 g in sodium chloride 0.9 %  100 mL IVPB  Status:  Discontinued        1 g 200 mL/hr over 30 Minutes Intravenous Every 8 hours 12/04/20 1207 12/06/20 1404   10/27/20 0845  meropenem (MERREM) 1 g in sodium chloride 0.9 % 100 mL IVPB  Status:  Discontinued        1 g 200 mL/hr over 30 Minutes Intravenous Every 8 hours 10/27/20 0752 11/01/20 0720   10/26/20 1145  cefTRIAXone (ROCEPHIN) 1 g in sodium chloride 0.9 % 100 mL IVPB  Status:  Discontinued        1 g 200 mL/hr over 30 Minutes Intravenous Every 24 hours 10/26/20 1056 10/27/20 0752   10/14/20 1815  Ampicillin-Sulbactam (UNASYN) 3 g in sodium chloride 0.9 % 100 mL IVPB        3 g 200 mL/hr over 30 Minutes Intravenous Every 6 hours 10/14/20 1754 10/19/20 1340   10/08/20 0845  ciprofloxacin (CIPRO) tablet 500 mg  Status:  Discontinued        500 mg Per Tube 2 times daily 10/08/20 0754 10/14/20 1731   09/21/20 1545  ceFAZolin (ANCEF) IVPB 1 g/50 mL premix        1 g 100 mL/hr over 30 Minutes Intravenous Every 8 hours 09/21/20 1457 09/28/20 0647   09/20/20 0600  ceFAZolin (ANCEF) IVPB 1 g/50 mL premix  Status:  Discontinued        1 g 100 mL/hr over 30 Minutes Intravenous Every 8 hours 09/14/20 0903 09/14/20 0904   09/16/20 1300  vancomycin (VANCOREADY) IVPB 1250 mg/250 mL  Status:  Discontinued        1,250 mg 166.7 mL/hr over 90 Minutes Intravenous Every 24 hours 09/15/20 1232 09/17/20 0956   09/15/20 1230  vancomycin (VANCOREADY) IVPB 1250 mg/250 mL        1,250 mg 166.7 mL/hr over 90 Minutes Intravenous  Once 09/15/20 1131 09/15/20 1402   09/13/20 1400  ceFEPIme (MAXIPIME) 2 g in sodium chloride 0.9 % 100 mL IVPB        2 g 200 mL/hr over 30 Minutes Intravenous Every 8 hours 09/13/20 1341 09/19/20 2208   09/06/20 0600  vancomycin (VANCOREADY) IVPB 1250 mg/250 mL        1,250 mg 166.7 mL/hr over 90 Minutes Intravenous  Once 09/06/20 0541 09/06/20 0739   09/05/20 1400  ceFAZolin (ANCEF) IVPB 1 g/50 mL premix  Status:  Discontinued        1 g 100 mL/hr over 30  Minutes Intravenous Every 8 hours 09/05/20 1120 09/13/20 1341   08/30/20 0600  vancomycin (VANCOREADY) IVPB 500 mg/100 mL  Status:  Discontinued        500 mg 100 mL/hr over 60 Minutes Intravenous Every 12 hours 08/29/20 1554 09/01/20 1115   08/29/20 1700  ceFEPIme (MAXIPIME) 2 g in sodium chloride 0.9 % 100 mL IVPB  Status:  Discontinued        2 g 200 mL/hr over 30 Minutes Intravenous Every 12 hours 08/29/20 1554 09/01/20 1115   08/29/20 1645  vancomycin (VANCOREADY) IVPB 1250 mg/250 mL        1,250 mg 166.7 mL/hr over 90 Minutes Intravenous  Once 08/29/20 1554 08/29/20 1820      Subjective:   Charlene [name] seen and examined today.  Minimally tracking, nonverbal today.   Objective:   Vitals:   12/13/20 0700 12/13/20 0827 12/13/20 0841 12/13/20 1139  BP:  135/74 (!) 156/76 (!) 156/76  Pulse:  74 79 78  Resp:  '16 19 16  ' Temp:   98.2 F (36.8 C)   TempSrc:   Oral   SpO2:  98% 98% 99%  Weight: 56 kg     Height:        Intake/Output Summary (Last 24 hours) at 12/13/2020 1219 Last data filed at 12/13/2020 0300 Gross per 24 hour  Intake --  Output 250 ml  Net -250 ml   Filed Weights   12/11/20 0457 12/12/20 0500 12/13/20 0700  Weight: 51 kg 52.2 kg 56 kg    Exam  General: Well developed, chronically ill-appearing, NAD  HEENT: NCAT, mucous membranes moist.   Neck: Trach in place  Cardiovascular: S1 S2 auscultated, no rubs, murmurs or gallops. Regular rate and rhythm.  Respiratory: Diminished breath sounds anteriorly  Abdomen: Soft, nontender, nondistended, + bowel sounds, PEG tube in place  Extremities: warm dry without cyanosis clubbing or edema.  Bilateral atrophy of thigh and calf muscles  Neuro: Awake however cannot assess alertness or orientation at this time.  Patient minimally tracking and not following commands at this time.  Data Reviewed: I have personally reviewed following labs and imaging studies  CBC: Recent Labs  Lab 12/07/20 0451  WBC 5.7   HGB 10.5*  HCT 33.5*  MCV 79.6*  PLT 258   Basic Metabolic Panel: Recent Labs  Lab 12/07/20 0451  NA 139  K 4.0  CL 100  CO2 31  GLUCOSE 115*  BUN 15  CREATININE 0.48  CALCIUM 9.6  MG 2.0   GFR: Estimated Creatinine Clearance: 71.9 mL/min (by C-G formula based on SCr of 0.48 mg/dL). Liver Function Tests: No results for input(s): AST, ALT, ALKPHOS, BILITOT, PROT, ALBUMIN in the last 168 hours. No results for input(s): LIPASE, AMYLASE in the last 168 hours. No results for input(s): AMMONIA in the last 168 hours. Coagulation Profile: No results for input(s): INR, PROTIME in the last 168 hours. Cardiac Enzymes: No results for input(s): CKTOTAL, CKMB, CKMBINDEX, TROPONINI in the last 168 hours. BNP (last 3 results) No results for input(s): PROBNP in the last 8760 hours. HbA1C: No results for input(s): HGBA1C in the last 72 hours. CBG: Recent Labs  Lab 12/12/20 1530 12/12/20 2023 12/12/20 2324 12/13/20 0325 12/13/20 0844  GLUCAP 110* 121* 149* 111* 178*   Lipid Profile: No results for input(s): CHOL, HDL, LDLCALC, TRIG, CHOLHDL, LDLDIRECT in the last 72 hours. Thyroid Function Tests: No results for input(s): TSH, T4TOTAL, FREET4, T3FREE, THYROIDAB in the last 72 hours. Anemia Panel: No results for input(s): VITAMINB12, FOLATE, FERRITIN, TIBC, IRON, RETICCTPCT in the last 72 hours. Urine analysis:    Component Value Date/Time   COLORURINE STRAW (A) 12/04/2020 1030   APPEARANCEUR TURBID (A) 12/04/2020 1030   LABSPEC 1.010 12/04/2020 1030   PHURINE 7.0 12/04/2020 1030   GLUCOSEU NEGATIVE 12/04/2020 1030   HGBUR NEGATIVE 12/04/2020 1030   BILIRUBINUR NEGATIVE 12/04/2020 1030   KETONESUR NEGATIVE 12/04/2020 1030   PROTEINUR NEGATIVE 12/04/2020 1030   UROBILINOGEN 0.2 02/21/2014 1629   NITRITE NEGATIVE 12/04/2020 1030   LEUKOCYTESUR TRACE (A) 12/04/2020 1030   Sepsis Labs: '@LABRCNTIP' (procalcitonin:4,lacticidven:4)  ) Recent Results (from the past 240  hour(s))  Culture, Urine     Status: Abnormal   Collection Time: 12/04/20  9:07 AM   Specimen: Urine, Catheterized  Result Value Ref Range Status   Specimen Description URINE, CATHETERIZED  Final   Special Requests   Final    NONE Performed at [facility]  Lab, [address] [place], [place] [address]    Culture >=100,000 COLONIES/mL KLEBSIELLA PNEUMONIAE (A)  Final   Report Status 12/06/2020 FINAL  Final   Organism ID, Bacteria KLEBSIELLA PNEUMONIAE (A)  Final      Susceptibility   Klebsiella pneumoniae - MIC*    AMPICILLIN >=32 RESISTANT Resistant     CEFAZOLIN <=4 SENSITIVE Sensitive     CEFEPIME <=0.12 SENSITIVE Sensitive     CEFTRIAXONE <=0.25 SENSITIVE Sensitive     CIPROFLOXACIN 0.5 SENSITIVE Sensitive     GENTAMICIN <=1 SENSITIVE Sensitive     IMIPENEM <=0.25 SENSITIVE Sensitive     NITROFURANTOIN 128 RESISTANT Resistant     TRIMETH/SULFA <=20 SENSITIVE Sensitive     AMPICILLIN/SULBACTAM 8 SENSITIVE Sensitive     PIP/TAZO 8 SENSITIVE Sensitive     * >=100,000 COLONIES/mL KLEBSIELLA PNEUMONIAE  Resp Panel by RT-PCR (Flu A&B, Covid) Nasopharyngeal Swab     Status: None   Collection Time: 12/04/20 10:10 AM   Specimen: Nasopharyngeal Swab; Nasopharyngeal(NP) swabs in vial transport medium  Result Value Ref Range Status   SARS Coronavirus 2 by RT PCR NEGATIVE NEGATIVE Final    Comment: (NOTE) SARS-CoV-2 target nucleic acids are NOT DETECTED.  The SARS-CoV-2 RNA is generally detectable in upper respiratory specimens during the acute phase of infection. The lowest concentration of SARS-CoV-2 viral copies this assay can detect is 138 copies/mL. A negative result does not preclude SARS-Cov-2 infection and should not be used as the sole basis for treatment or other patient management decisions. A negative result may occur with  improper specimen collection/handling, submission of specimen other than nasopharyngeal swab, presence of viral mutation(s) within the areas  targeted by this assay, and inadequate number of viral copies(<138 copies/mL). A negative result must be combined with clinical observations, patient history, and epidemiological information. The expected result is Negative.  Fact Sheet for Patients:  EntrepreneurPulse.com.au  Fact Sheet for Healthcare Providers:  IncredibleEmployment.be  This test is no t yet approved or cleared by the [place] FDA and  has been authorized for detection and/or diagnosis of SARS-CoV-2 by FDA under an Emergency Use Authorization (EUA). This EUA will remain  in effect (meaning this test can be used) for the duration of the COVID-19 declaration under Section 564(b)(1) of the Act, 21 U.S.C.section 360bbb-3(b)(1), unless the authorization is terminated  or revoked sooner.       Influenza A by PCR NEGATIVE NEGATIVE Final   Influenza B by PCR NEGATIVE NEGATIVE Final    Comment: (NOTE) The Xpert Xpress SARS-CoV-2/FLU/RSV plus assay is intended as an aid in the diagnosis of influenza from Nasopharyngeal swab specimens and should not be used as a sole basis for treatment. Nasal washings and aspirates are unacceptable for Xpert Xpress SARS-CoV-2/FLU/RSV testing.  Fact Sheet for Patients: EntrepreneurPulse.com.au  Fact Sheet for Healthcare Providers: IncredibleEmployment.be  This test is not yet approved or cleared by the [place] FDA and has been authorized for detection and/or diagnosis of SARS-CoV-2 by FDA under an Emergency Use Authorization (EUA). This EUA will remain in effect (meaning this test can be used) for the duration of the COVID-19 declaration under Section 564(b)(1) of the Act, 21 U.S.C. section 360bbb-3(b)(1), unless the authorization is terminated or revoked.  Performed at [facility], [place] [address], [place], [place] [address]   Culture, blood (Routine X 2) w Reflex to ID Panel     Status: None    Collection Time: 12/04/20 10:14 AM   Specimen: BLOOD LEFT  HAND  Result Value Ref Range Status   Specimen Description BLOOD LEFT HAND  Final   Special Requests   Final    BOTTLES DRAWN AEROBIC AND ANAEROBIC Blood Culture adequate volume   Culture   Final    NO GROWTH 5 DAYS Performed at [facility], [address] [place], [place] [address]    Report Status 12/09/2020 FINAL  Final  Culture, blood (Routine X 2) w Reflex to ID Panel     Status: None   Collection Time: 12/04/20 10:16 AM   Specimen: BLOOD  Result Value Ref Range Status   Specimen Description BLOOD RIGHT ANTECUBITAL  Final   Special Requests   Final    BOTTLES DRAWN AEROBIC AND ANAEROBIC Blood Culture adequate volume   Culture   Final    NO GROWTH 5 DAYS Performed at [facility], [place] [address] [place], [place] [address]    Report Status 12/09/2020 FINAL  Final      Radiology Studies: No results found.   Scheduled Meds: . acetaminophen  650 mg Oral Q4H   Or  . acetaminophen (TYLENOL) oral liquid 160 mg/5 mL  650 mg Per Tube Q4H   Or  . acetaminophen  650 mg Rectal Q4H  . amLODipine  10 mg Per Tube Daily  . atorvastatin  40 mg Per Tube Daily  . baclofen  7.5 mg Per Tube 2 times per day  . chlorhexidine gluconate (MEDLINE KIT)  15 mL Mouth Rinse BID  . Chlorhexidine Gluconate Cloth  6 each Topical Daily  . diclofenac Sodium  4 g Topical QID  . feeding supplement (PROSource TF)  45 mL Per Tube Daily  . free water  100 mL Per Tube Q4H  . hydrALAZINE  50 mg Per Tube Q8H  . ibuprofen  400 mg Per Tube Q8H  . insulin aspart  0-9 Units Subcutaneous Q4H  . mouth rinse  15 mL Mouth Rinse 5 X Daily  . metoprolol tartrate  100 mg Per Tube BID  . modafinil  200 mg Per Tube Daily  . oxybutynin  5 mg Per Tube BID  . pantoprazole sodium  40 mg Per Tube Daily  . tiZANidine  2 mg Per Tube QHS   Continuous Infusions: . sodium chloride Stopped (10/26/20 1445)  . feeding supplement (OSMOLITE 1.2 CAL)  1,000 mL (12/13/20 0007)     LOS: 108 days   Time Spent in minutes   15 minutes  Bitania Shankland D.O. on 12/13/2020 at 12:19 PM  Between 7am to 7pm - Please see pager noted on [email or web address]  After 7pm go to [email or web address]  And look for the night coverage person covering for me after hours  [facility] Office  [phone]

## 2020-12-14 LAB — COMPREHENSIVE METABOLIC PANEL
ALT: 37 U/L (ref 0–44)
AST: 24 U/L (ref 15–41)
Albumin: 3.1 g/dL — ABNORMAL LOW (ref 3.5–5.0)
Alkaline Phosphatase: 100 U/L (ref 38–126)
Anion gap: 8 (ref 5–15)
BUN: 13 mg/dL (ref 6–20)
CO2: 31 mmol/L (ref 22–32)
Calcium: 9.4 mg/dL (ref 8.9–10.3)
Chloride: 101 mmol/L (ref 98–111)
Creatinine, Ser: 0.53 mg/dL (ref 0.44–1.00)
GFR, Estimated: >60 mL/min (ref >60)
Glucose, Bld: 124 mg/dL — ABNORMAL HIGH (ref 70–99)
Potassium: 3.9 mmol/L (ref 3.5–5.1)
Sodium: 140 mmol/L (ref 135–145)
Total Bilirubin: 0.5 mg/dL (ref 0.3–1.2)
Total Protein: 6.6 g/dL (ref 6.5–8.1)

## 2020-12-14 LAB — GLUCOSE, CAPILLARY
Glucose-Capillary: 104 mg/dL — ABNORMAL HIGH (ref 70–99)
Glucose-Capillary: 106 mg/dL — ABNORMAL HIGH (ref 70–99)
Glucose-Capillary: 109 mg/dL — ABNORMAL HIGH (ref 70–99)
Glucose-Capillary: 111 mg/dL — ABNORMAL HIGH (ref 70–99)
Glucose-Capillary: 125 mg/dL — ABNORMAL HIGH (ref 70–99)
Glucose-Capillary: 126 mg/dL — ABNORMAL HIGH (ref 70–99)

## 2020-12-14 LAB — CBC
HCT: 34.1 % — ABNORMAL LOW (ref 36.0–46.0)
Hemoglobin: 10.5 g/dL — ABNORMAL LOW (ref 12.0–15.0)
MCH: 25 pg — ABNORMAL LOW (ref 26.0–34.0)
MCHC: 30.8 g/dL (ref 30.0–36.0)
MCV: 81.2 fL (ref 80.0–100.0)
Platelets: 286 10*3/uL (ref 150–400)
RBC: 4.2 MIL/uL (ref 3.87–5.11)
RDW: 14.7 % (ref 11.5–15.5)
WBC: 4.6 10*3/uL (ref 4.0–10.5)
nRBC: 0 % (ref 0.0–0.2)

## 2020-12-14 NOTE — Progress Notes (Signed)
Physical Therapy Treatment Patient Details Name: Charlene [name] MRN: [id number] DOB: [birth year] Today's Date: 12/14/2020    History of Present Illness Pt is 55 y/o female presents to [facility] on 08/26/20 with L sided weakness and headaches. 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 s/p Right frontal ventricular catheter placement on 2/10, stopped functioning on 2/11.  MRI 2/11 shows right greater than left mesial temporal lobes / parahippocampal cortex, bilateral basal ganglia, and possibly the upper pons that are concerning for acute infarcts, most likely secondary to mass effect from the hemorrhage. ETT 2/10-2/24.  Pt with trach and PEG on 09/10/20. Hospital course complicated by persistent fever, Serratia pneumonia and now pre-orbital cellulitis.   PMH includes anxiety, HTN.    PT Comments    Co-treat with OT for transfer to tilt-in-space wheelchair and to take pt off unit for time outside. Overall pt tolerated well, with increased attention to cars and people passing by. Pt was able to maintain midline sitting posture throughout session. Noted increased spontaneous RUE movement with purposeful reaches towards face and arm rest of w/c throughout session, and to R knee at end of session (possibly due to discomfort in knee). Total time in wheelchair ~40 minutes. Will continue to follow and progress as able per POC.    Follow Up Recommendations  SNF     Equipment Recommendations  Hospital bed;Wheelchair cushion (measurements PT);Wheelchair (measurements PT);Other (comment) (18x18 tilt in space WC, hoyer lift)    Recommendations for Other Services Rehab consult     Precautions / Restrictions Precautions Precautions: Fall Precaution Comments: peg and trach Required Braces or Orthoses: Other Brace Other Brace: RLE soft knee brace and LUE soft elbow brace  (wearing schedule in RN orders); bil PRAFOs Restrictions Weight Bearing Restrictions: No    Mobility  Bed Mobility Overal bed mobility: Needs Assistance Bed Mobility: Rolling;Sidelying to Sit Rolling: Total assist Sidelying to sit: Total assist;+2 for safety/equipment       General bed mobility comments: total assist to roll towards L side and ascend to EOB    Transfers Overall transfer level: Needs assistance Equipment used: 2 person hand held assist Transfers: Squat Pivot Transfers     Squat pivot transfers: Total assist;+2 physical assistance;From elevated surface     General transfer comment: +2 assist for squat pivot bed to chair. Bed pad utilized for support. Increased time required to adjust sitting position, but once positioned properly in the tilt-in-space wheelchair, pt able to maintain midline sitting posture.  Ambulation/Gait             General Gait Details: non ambulatory   Stairs             Wheelchair Mobility    Modified Rankin (Stroke Patients Only) Modified Rankin (Stroke Patients Only) Pre-Morbid Rankin Score: No symptoms Modified Rankin: Severe disability     Balance Overall balance assessment: Needs assistance Sitting-balance support: No upper extremity supported;Feet supported Sitting balance-Leahy Scale: Poor Sitting balance - Comments: In tilt-in-space wheelchair, pt was able to maintain midline sitting posture.                                    Cognition Arousal/Alertness: Awake/alert Behavior During Therapy: Flat affect Overall Cognitive Status: Impaired/Different from baseline Area of Impairment: Following commands;Problem solving  Orientation Level: Disoriented to;Place;Time;Situation (seems to recognize family, especially responsive to her husband, [name] when he is here) Current Attention Level: Focused Memory: Decreased short-term memory Following Commands: Follows one step  commands inconsistently;Follows one step commands with increased time Safety/Judgement: Decreased awareness of safety;Decreased awareness of deficits Awareness: Intellectual Problem Solving: Slow processing;Decreased initiation;Difficulty sequencing;Requires verbal cues;Requires tactile cues General Comments: Pt alert and looking around (mainly to the R) when outside. Pt especially looking towards the R when cars would drive by. Tracking mom mostly during session. Appeared to move eyes to the L to command, and at end of session spontaneously turning head (slowly) to the L.      Exercises Other Exercises Other Exercises: Gentle cervical rotation stretch 30" x2    General Comments        Pertinent Vitals/Pain Pain Assessment: Faces Faces Pain Scale: Hurts a little bit Pain Location: Pt reaching for R knee towards end of session. Pillows placed under knees in wheelchair for comfort and pt appeared improved. Pain Descriptors / Indicators: Grimacing;Guarding;Moaning Pain Intervention(s): Repositioned;Premedicated before session    Home Living                      Prior Function            PT Goals (current goals can now be found in the care plan section) Acute Rehab PT Goals Patient Stated Goal: Pt unable to state PT Goal Formulation: Patient unable to participate in goal setting Time For Goal Achievement: 12/28/20 Potential to Achieve Goals: Poor Additional Goals Additional Goal #1: Provide positioning, mobility, and transfer education to family and have family participate in these activities during PT Progress towards PT goals: Progressing toward goals    Frequency    Min 2X/week      PT Plan Current plan remains appropriate    Co-evaluation PT/OT/SLP Co-Evaluation/Treatment: Yes Reason for Co-Treatment: Complexity of the patient's impairments (multi-system involvement);Necessary to address cognition/behavior during functional activity;For patient/therapist  safety;To address functional/ADL transfers PT goals addressed during session: Mobility/safety with mobility;Balance;Strengthening/ROM        AM-PAC PT "6 Clicks" Mobility   Outcome Measure  Help needed turning from your back to your side while in a flat bed without using bedrails?: Total Help needed moving from lying on your back to sitting on the side of a flat bed without using bedrails?: Total Help needed moving to and from a bed to a chair (including a wheelchair)?: Total Help needed standing up from a chair using your arms (e.g., wheelchair or bedside chair)?: Total Help needed to walk in hospital room?: Total Help needed climbing 3-5 steps with a railing? : Total 6 Click Score: 6    End of Session Equipment Utilized During Treatment: Gait belt Activity Tolerance: Patient tolerated treatment well Patient left: with family/visitor present (In tilt-in-space wheelchair with mom present in room) Nurse Communication: Mobility status PT Visit Diagnosis: Hemiplegia and hemiparesis;Other symptoms and signs involving the nervous system (R29.898);Other abnormalities of gait and mobility (R26.89) Hemiplegia - Right/Left: Right Hemiplegia - dominant/non-dominant: Dominant Hemiplegia - caused by: Cerebral infarction;Nontraumatic intracerebral hemorrhage;Other Nontraumatic intracranial hemorrhage     Time: 0950-1100 PT Time Calculation (min) (ACUTE ONLY): 70 min  Charges:  $Therapeutic Activity: 23-37 mins                     Conni Slipper, PT, DPT Acute Rehabilitation Services Pager: [phone] Office: [phone]    Marylynn Pearson 12/14/2020, 11:46 AM

## 2020-12-14 NOTE — Progress Notes (Signed)
Occupational Therapy Progress note  Pt assisted back to bed from tilt in space w/c.  She required total A +2.  She was able to follow command x 2 to retrieve lotion from me, then hand it back to me.  She was also noted to perform saccadic fixation from OT on Rt to PT on her Lt then back to OT as each of them spoke to her.   Pt making slow, but steady progress.    12/14/20 1353  OT Visit Information  Last OT Received On 12/14/20  Assistance Needed +2  PT/OT/SLP Co-Evaluation/Treatment Yes  History of Present Illness Pt is 55 y/o female presents to [facility] on 08/26/20 with L sided weakness and headaches. 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 s/p Right frontal ventricular catheter placement on 2/10, stopped functioning on 2/11.  MRI 2/11 shows right greater than left mesial temporal lobes / parahippocampal cortex, bilateral basal ganglia, and possibly the upper pons that are concerning for acute infarcts, most likely secondary to mass effect from the hemorrhage. ETT 2/10-2/24.  Pt with trach and PEG on 09/10/20. Hospital course complicated by persistent fever, Serratia pneumonia and now pre-orbital cellulitis.   PMH includes anxiety, HTN.  Precautions  Precautions Fall  Precaution Comments peg and trach  Required Braces or Orthoses Other Brace  Other Brace RLE soft knee brace and LUE soft elbow brace (wearing schedule in RN orders); bil PRAFOs  Pain Assessment  Pain Assessment Faces  Faces Pain Scale 2  Pain Location Pt rubbing Rt knee  Pain Descriptors / Indicators Restless  Pain Intervention(s) Repositioned  Cognition  Arousal/Alertness Awake/alert  Behavior During Therapy Flat affect  Overall Cognitive Status Impaired/Different from baseline  Area of Impairment Following commands;Problem solving  Current Attention Level Focused  Problem Solving Slow  processing;Decreased initiation;Requires verbal cues;Requires tactile cues  General Comments pt purposefully reached for therapist x 2. She reached for and retrieved lotion container on command then gave it back to therapist on command  Difficult to assess due to Tracheostomy  Upper Extremity Assessment  Upper Extremity Assessment RUE deficits/detail  RUE Deficits / Details pt purposefully reaching for therapist and objects  RUE Coordination decreased gross motor;decreased fine motor  LUE Deficits / Details Pt demonstrates ~-20* elbow extension.  Shoulder flexion ~~80*; wrist and hand WFL  LUE Coordination decreased gross motor;decreased fine motor  Lower Extremity Assessment  Lower Extremity Assessment Defer to PT evaluation  Bed Mobility  Overal bed mobility Needs Assistance  Bed Mobility Rolling;Sit to Sidelying  Sit to sidelying Total assist;+2 for physical assistance;+2 for safety/equipment  General bed mobility comments Pt required assist for all aspects  Balance  Overall balance assessment Needs assistance  Sitting-balance support No upper extremity supported;Feet supported  Sitting balance-Leahy Scale Poor  Sitting balance - Comments In tilt-in-space wheelchair, pt was able to maintain midline sitting posture.  Vision- Assessment  Additional Comments Pt noted to fixate on therapist on her Rt, while therapist talking with her, then looked and fixated on PT who was on her Left who waved good bye, then fixated back on OT on the right  Transfers  Overall transfer level Needs assistance  Equipment used 2 person hand held assist  Transfers Squat Pivot Transfers  Squat pivot transfers Total assist;+2 physical assistance;From elevated surface  General transfer comment total A +2 provided to transfer pt back to bed from w/c  Other Exercises  Other Exercises Rt kneed passively stretched into extension then splint applied  Other Exercises Lt elbow passively stretched into extension and  brace applied  OT - End of Session  Equipment Utilized During Treatment Oxygen  Activity Tolerance Patient tolerated treatment well  Patient left with call bell/phone within reach;with family/visitor present;in bed  Nurse Communication Mobility status;Need for lift equipment  OT Assessment/Plan  OT Plan Discharge plan remains appropriate;Frequency remains appropriate  OT Visit Diagnosis Muscle weakness (generalized) (M62.81);Apraxia (R48.2);Cognitive communication deficit (R41.841);Hemiplegia and hemiparesis  Symptoms and signs involving cognitive functions Other Nontraumatic ICH  Hemiplegia - Right/Left Right  Hemiplegia - caused by Other Nontraumatic intracranial hemorrhage  OT Frequency (ACUTE ONLY) Min 2X/week  Follow Up Recommendations SNF;Supervision/Assistance - 24 hour  OT Equipment None recommended by OT  AM-PAC OT "6 Clicks" Daily Activity Outcome Measure (Version 2)  Help from another person eating meals? 1  Help from another person taking care of personal grooming? 1  Help from another person toileting, which includes using toliet, bedpan, or urinal? 1  Help from another person bathing (including washing, rinsing, drying)? 1  Help from another person to put on and taking off regular upper body clothing? 1  Help from another person to put on and taking off regular lower body clothing? 1  6 Click Score 6  OT Goal Progression  Progress towards OT goals Progressing toward goals  ADL Goals  Additional ADL Goal #1 Pt will wash face with max A  Additional ADL Goal #2 Pt will feed self 6 bites of food with moderate hand over hand assist  OT Time Calculation  OT Start Time (ACUTE ONLY) 1214  OT Stop Time (ACUTE ONLY) 1245  OT Time Calculation (min) 31 min  OT General Charges  $OT Visit 1 Visit  OT Treatments  $Therapeutic Activity 8-22 mins  Eber Jones., OTR/L Acute Rehabilitation Services Pager [phone] Office [phone]

## 2020-12-14 NOTE — Plan of Care (Signed)
  Problem: Coping: Goal: Will identify appropriate support needs Outcome: Progressing   Problem: Health Behavior/Discharge Planning: Goal: Ability to manage health-related needs will improve Outcome: Progressing   Problem: Self-Care: Goal: Verbalization of feelings and concerns over difficulty with self-care will improve Outcome: Progressing Goal: Ability to communicate needs accurately will improve Outcome: Progressing   Problem: Nutrition: Goal: Risk of aspiration will decrease Outcome: Progressing   Problem: Intracerebral Hemorrhage Tissue Perfusion: Goal: Complications of Intracerebral Hemorrhage will be minimized Outcome: Progressing   Problem: Education: Goal: Knowledge of General Education information will improve Description: Including pain rating scale, medication(s)/side effects and non-pharmacologic comfort measures Outcome: Progressing   Problem: Health Behavior/Discharge Planning: Goal: Ability to manage health-related needs will improve Outcome: Progressing   Problem: Clinical Measurements: Goal: Ability to maintain clinical measurements within normal limits will improve Outcome: Progressing Goal: Will remain free from infection Outcome: Progressing Goal: Diagnostic test results will improve Outcome: Progressing Goal: Respiratory complications will improve Outcome: Progressing Goal: Cardiovascular complication will be avoided Outcome: Progressing   Problem: Coping: Goal: Level of anxiety will decrease Outcome: Progressing   Problem: Elimination: Goal: Will not experience complications related to bowel motility Outcome: Progressing Goal: Will not experience complications related to urinary retention Outcome: Progressing   Problem: Pain Managment: Goal: General experience of comfort will improve Outcome: Progressing   Problem: Skin Integrity: Goal: Risk for impaired skin integrity will decrease Outcome: Progressing   Problem: Intracerebral  Hemorrhage Tissue Perfusion: Goal: Complications of Intracerebral Hemorrhage will be minimized Outcome: Progressing   Problem: Ischemic Stroke/TIA Tissue Perfusion: Goal: Complications of ischemic stroke/TIA will be minimized Outcome: Progressing   

## 2020-12-14 NOTE — Progress Notes (Signed)
NAME:  Charlene [name], MRN:  [id number], DOB:  06/14/1966, LOS: 109 ADMISSION DATE:  08/26/2020, CONSULTATION DATE:  2/10 REFERRING MD:  Roda Shutters, CHIEF COMPLAINT:  headache   History of Present Illness:  55 year old female admitted with hypertensive R thalamic/basal ganglia ICH. Resultant obstructive hydrocephalus prompting EVD placement 2/10. Subsequently, 2/10 she had acute mental status change to obtundation and respiratory arrest resulting in intubation. Trach and Peg 2/24. Trach change to #6 cuffless on 3/11.   Pertinent  Medical History  Hypertension Medication non-compliance  Significant Hospital Events: Including procedures, antibiotic start and stop dates in addition to other pertinent events   2/09 Admit for ICH 2/10 EVD placed, later intubated due to obtundation and respiratory arrest 2/11 MRI Brain, hypertonic saline d/c'd, EVD not working.  Husband did not consent to PICC  2/13 EVD removed 2/17 GOC discussion with family, PMT 3/11 trach changed to Shiley 6 uncuffed 4/18 28% ATC / 5L  5/8 downsized trach to 4 5/16 tolerating z 4 well. No real functional improvements since 5/8 5/ 23 no improvement in mental status 5/20 no improvement of mental status   ETT 2/10 >> 09/10/2020 EVD 2/10 > non functioning 2/11 at 1500; clamped 2/12 am > 2/13 Trach (Dr. Denese Killings) 2/24 >>  PEG placement (Dr. Sheliah Hatch) 2/24 >>  CT Head 2/9 >> Acute hemorrhage with the epicenter in the right thalamus. Measurement is approximately 3.2 x 2.1 x 2.5 cm (volume 8.8 cm^3). Intraventricular penetration with blood filling the third and fourth ventricles and nearly filling the right lateral ventricle. No hydrocephalus. CTA Head/Neck 2/10 >> Unchanged size of intraparenchymal hemorrhage centered in the right thalamus and basal ganglia with extension into the right lateral ventricle. 2. No intracranial arterial occlusion or high-grade stenosis. No dissection, aneurysm or hemodynamically significant stenosis of  the carotid or vertebral arteries. CT Head 2/10 >> Stable parenchymal hemorrhage in the right thalamus and basal ganglia with intraventricular extension as described. No new focal abnormality is noted. CT Head 2/10 (post intubation) >> redemonstrated intraparenchymal hemorrhage centered within the R thalamus/basal ganglia extending into the cerebral peduncle/midbrain with associated intraventricular extension and hemorrhage in the R lateral ventricle and fourth ventricle, overall not significantly changed from prior; stable mass effect, 22mm of R to L midline shift, postsurgical changes of ventriculostomy TTE 2/10 >> LVEF 60 to 65%, LV has normal function, no regional wall motion abnormalities, mild concentric LVH.  RV systolic function is normal, normal pulmonary artery systolic pressure.  MRA head 2/11 >>Negative intracranial MR angiography. No evidence of aneurysm or high flow vascular malformation to explain the intraparenchymal hemorrhage  MRI brain 2/11 >> Similar intraparenchymal hemorrhage centered within the right thalamus and basal ganglia extending into the right midbrain with intraventricular extension, effacement of the prepontine cistern and similar 4 mm of leftward midline shift; separate areas of restricted diffusion and edema involving the R > L mesial temporal lobes/parahippocampal cortex, bilateral basal ganglia, and possibly the upper pons that are c/f acute infarcts, most likely secondary to mass effect from hemorrhage. MRI brain 2/19 >> intraventricular clot has decreased in size but mid-brain parenchymal clot has not and there are more ischemic infarcts in the surrounding brain.  CT Head WO Contrast 09/07/20>> Expected evolution of intracranial hemorrhage since 08/30/2020 head CT. Multiple areas of low attenuation corresponding to some of the infarcts seen on prior MRI. Ventricle caliber similar to prior MRI  Interim History / Subjective:  No change.   Objective   Blood pressure  129/75, pulse 70,  temperature 98.3 F (36.8 C), temperature source Axillary, resp. rate 14, height 5\' 5"  (1.651 m), weight 56.6 kg, last menstrual period 03/03/2014, SpO2 97 %, unknown if currently breastfeeding.    FiO2 (%):  [21 %] 21 %   Intake/Output Summary (Last 24 hours) at 12/14/2020 0853 Last data filed at 12/14/2020 0401 Gross per 24 hour  Intake --  Output 800 ml  Net -800 ml   Filed Weights   12/12/20 0500 12/13/20 0700 12/14/20 0400  Weight: 52.2 kg 56 kg 56.6 kg    Examination:  General: Deconditioned,  Contracted, minimally responsive female HENT 4 trach uncuffed. Cards: rrr, no murmur Abd: soft peg in place Ext: warm and dry  Neuro: awake w/ voice. Not following commands or tracking   Labs/imaging that I havepersonally reviewed  (right click and "Reselect all SmartList Selections" daily)  WBC today normal Hgb 10.5 Chemistries stable  Resolved Hospital Problem list     Assessment & Plan:  Tracheostomy dependence post CVA Severe deconditioning Muscle wasting Poor cough mechanics  Discussion She has tolerated capping trials. However, her baseline encephalopathy and poor cough mechanics preclude decannulation given extreme risk of airway compromise.  Plan Cont routine trach care Not a candidate for decannulation   We will sign off. Please contact with any new concerns or if neurologic function improves.  Best practice (right click and "Reselect all SmartList Selections" daily)  Per primary   12/16/20, MD [facility] Pulmonary/Critical Care See Amion for contact infor

## 2020-12-14 NOTE — Progress Notes (Signed)
[facility] PROGRESS NOTE  Charlene [name] YOV:[id number] DOB: 11/14/1965 DOA: 08/26/2020 PCP: Patient, [name] (Inactive)    October 08, 2020     10/16/2020                        10/16/2020     10/18/2020                        10/20/2020    Status: Remains inpatient appropriate because:Altered mental status, Unsafe d/c plan, IV treatments appropriate due to intensity of illness or inability to take PO and Inpatient level of care appropriate due to severity of illness   Dispo: The patient is from: Home              Anticipated d/c is to: SNF trach capable-[place] but [place] beds available-mental status remains variable and as of 5/23 unable to decannulate per trach team evaluation.              Patient currently is medically stable to d/c.   Difficult to place patient Yes   Level of care: Telemetry Medical  Code Status: Full Family Communication: husband Charlene [name] [phone]) is her POA and legal contact.  [name] was at bedside on 5/18 and given update.  [name] at bedside on 5/30 DVT prophylaxis: Chronically bedbound.  5/6: We will discontinue prophylactic Lovenox   Vaccination status: Unvaccinated-have discussed both with patient's mother and with husband Charlene [name].  He wishes to speak to the rest of the family before making a decision but is leaning towards [name] vaccine.  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   Subsequently on 2/10, she developed obtundation and respiratory arrest leading to intubation.  She has remained persistently obtunded, now has a trach and a PEG.  Hospital course complicated by persistent fever, Serratia pneumonia and now pre-orbital cellulitis  As of 3/25 with adjustments in hypertonicity medications patient began to improve regarding hypertonicity.  Eventually started on provigil with improvements in alertness.  Evaluated by rehab physician who recommended increasing provigil dose further.  Subsequently patient has become more alert.  Most days she is able to track and with encouragement able to follow simple commands especially since partial obstruction glasses placed.  See progress note dictated 4/27 for expanded details.   2/09 Admit for [name] 2/10 EVD placed, later intubated due to obtundation and respiratory arrest 2/11 MRI Brain, hypertonic saline d/c'd, EVD not working.  Husband did not consent to PICC  2/13 EVD removed 2/17 [name] discussion with family, PMT 2/24 Bedside trach and PEG 3/1 Transferred to [name]  Subjective: Alert.  Tracking caregivers in room.  Right hand secured around trach collar tubing.  Making chewing movements with mouth.  Following simple commands.  Objective: Vitals:   12/14/20 0400 12/14/20 0739  BP: 131/77 139/90  Pulse: 69 72  Resp: 14 15  Temp: 98 F (36.7 C) 98.3 F (36.8 C)  SpO2: 98% 96%    Intake/Output Summary (Last 24 hours) at 12/14/2020 0805 Last data filed at 12/14/2020 0401 Gross per 24 hour  Intake --  Output 800 ml  Net -800 ml   Filed Weights   12/12/20 0500 12/13/20 0700 12/14/20 0400  Weight: 52.2 kg 56 kg 56.6 kg    Exam:  Constitutional: Alert, tracking persons in the room.  Appears to be comfortable Respiratory: #4.0 cuffless trach, trach collar with FiO2 21% at 5L, lungs are clear. Cardiovascular: S1-S2.  Regular pulse.  No peripheral edema. Abdomen:  PEG tube -LBM 5/23,  Musculoskeletal: Bilateral atrophy of  thigh and calf muscles-there are over both knees and as of today knees are no longer tender to palpation Neurologic: Awake, left facial droop.  Some gross motor purposeful movement of extremities right greater than left.  Nonverbal.  No spasticity. Psych: Awake.  Flat affect.  Given nonverbal state unable to accurately assess [place]  Assessment/Plan: Acute problems: Multiple acute strokes  Right Thalamus ICH with IVH  Obstructive Hydrocephalus/Acute on persistent metabolic Encephalopathy/brain injury related hypertonicity -Continue baclofen and Provigil -Continue PT/OT/SLP -5/2, CIR MD reevaluated patient " Mrs. Bebee unfortunately has not made any meaningful functional gains over the last month. She remains most appropriate for SNF level care".   Bilateral knee osteoarthritis -History of chronic knee pain secondary to osteoarthritis -Continue Voltaren gel; continue scheduled Tylenol and ibuprofen total 7 days duration  Hypertensive emergency/Malignant HTN -Due to financial constraints patient was not taking carvedilol and hydrochlorothiazide prior to admission -Continue Norvasc, Lopressor and hydralazine.   Acute hypoxemic respiratory failure secondary to CVA requiring mechanical ventilation (Resolved) Trach dependent -Downsized to #4 cuffless trach (5/8) -Despite tolerating capping trials trach team documents her baseline encephalopathy and poor cough mechanics preclude decannulation given extreme risk of airway compromise.  They states she is not a candidate for decannulation and as of 5/30 they have signed off  Dysphagia -Continue tube feedings, Prosource liquid, free water -Follow CBGs and provide SSI      Other problems: Right upper extremity thrombophlebitis - [name] -> age indeterminate SVT of right cephalic vein  Neck Contracture/torticollis -- Resolved  ESBL positive Enterobacter UTI -Has completed 5 days of meropenem -Contact isolation during  hospitalization -continue Oxybutynin suspected bladder spasms  Klebsiella UTI -Resistant to nitrofurantoin and ampicillin -Meropenem transitioned to Rocephin on 5/22 -To minimize risk of recurrent UTI have asked that pure wick only be used at bedtime on a 10 PM to 8 AM schedule  Recurrent diarrhea -Resolved -Likely combination of recent antibiotics as well as scheduled laxative -MiraLAX now as needed  Bacterial Conjunctivitis /periorbital cellulitis -Resolved  Chronic pansinusitis/recurrent fevers 2/2 aspiration PNA;History of Serratia Pneumonia  Hospital Acquired Pneumonia:  -Completed several courses of antibiotic -Follow-up imaging including sinus CT consistent with chronic but stable and resolving sinusitis   Anemia -Stable  HLD -Continue Lipitor      Data Reviewed: Basic Metabolic Panel: Recent Labs  Lab 12/14/20 0522  NA 140  K 3.9  CL 101  CO2 31  GLUCOSE 124*  BUN 13  CREATININE 0.53  CALCIUM 9.4   Liver Function Tests: Recent Labs  Lab 12/14/20 0522  AST 24  ALT 37  ALKPHOS 100  BILITOT 0.5  PROT 6.6  ALBUMIN 3.1*   CBC: Recent Labs  Lab 12/14/20 0522  WBC 4.6  HGB 10.5*  HCT 34.1*  MCV 81.2  PLT 286    CBG: Recent Labs  Lab 12/13/20 1657 12/13/20 1948 12/13/20 2352 12/14/20  0348 12/14/20 0740  GLUCAP 101* 110* 125* 109* 111*     Studies: No results found.  Scheduled Meds: . acetaminophen  650 mg Oral Q4H   Or  . acetaminophen (TYLENOL) oral liquid 160 mg/5 mL  650 mg Per Tube Q4H   Or  . acetaminophen  650 mg Rectal Q4H  . amLODipine  10 mg Per Tube Daily  . atorvastatin  40 mg Per Tube Daily  . baclofen  7.5 mg Per Tube 2 times per day  . chlorhexidine gluconate (MEDLINE KIT)  15 mL Mouth Rinse BID  . Chlorhexidine Gluconate Cloth  6 each Topical Daily  . diclofenac Sodium  4 g Topical QID  . feeding supplement (PROSource TF)  45 mL Per Tube Daily  . free water  100 mL Per Tube Q4H  . hydrALAZINE  50 mg Per  Tube Q8H  . ibuprofen  400 mg Per Tube Q8H  . insulin aspart  0-9 Units Subcutaneous Q4H  . mouth rinse  15 mL Mouth Rinse 5 X Daily  . metoprolol tartrate  100 mg Per Tube BID  . modafinil  200 mg Per Tube Daily  . oxybutynin  5 mg Per Tube BID  . pantoprazole sodium  40 mg Per Tube Daily  . tiZANidine  2 mg Per Tube QHS   Continuous Infusions: . sodium chloride Stopped (10/26/20 1445)  . feeding supplement (OSMOLITE 1.2 CAL) 1,000 mL (12/13/20 2232)    Principal Problem:   ICH (intracerebral hemorrhage) ([place]) Active Problems:   Acute hypoxemic respiratory failure ([place])   Status post tracheostomy ([place])   Palliative care by specialist   Muscle hypertonicity   Oropharyngeal dysphagia   Poor prognosis   Aspiration pneumonia of right lower lobe ([place])   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   Acute cystitis without hematuria   Consultants:  Neurology   Neurosurgery.   Palliative care.    PCCM continues to follow.  No decannulation.   Procedures:  Tracheostomy  PEG tube  EEG  Echocardiogram  Antibiotics: Vancomycin 2/12-2/15 Maxipime 2/12-2/15 Ancef 2/19-2/27 Vancomycin 2/20 x 1 dose Maxipime 2/27- 3/5 Vancomycin 3/1-3/3 Ancef 3/6-3/14 Cipro 3/20 4-3/30 Unasyn 3/30-4/4 Rocephin 4/11-4/12 Meropenem 4/12-4/17   Time spent: 25 minutes    Erin Hearing ANP  [facility] 7 am - 330 pm/M-F for direct patient care and secure chat Please refer to [place] for contact info 109  Days

## 2020-12-14 NOTE — Progress Notes (Addendum)
Occupational Therapy Treatment Patient Details Name: Charlene [name] MRN: [id number] DOB: 12/16/1965 Today's Date: 12/14/2020    History of present illness Pt is 55 y/o female presents to [facility] on 08/26/20 with L sided weakness and headaches. 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 s/p Right frontal ventricular catheter placement on 2/10, stopped functioning on 2/11.  MRI 2/11 shows right greater than left mesial temporal lobes / parahippocampal cortex, bilateral basal ganglia, and possibly the upper pons that are concerning for acute infarcts, most likely secondary to mass effect from the hemorrhage. ETT 2/10-2/24.  Pt with trach and PEG on 09/10/20. Hospital course complicated by persistent fever, Serratia pneumonia and now pre-orbital cellulitis.   PMH includes anxiety, HTN.   OT comments  Pt seen in conjunction with PT.  She was transferred to tilt in space w/c with total A +2. She was transported outside with mother present.  Pt noted to consistently track people and cars that passed by on her Rt side.   She also demonstrated increased volitional, isolated movement of Rt UE today.  Pt left sitting up in w/c in room  with mother present   Follow Up Recommendations  SNF;Supervision/Assistance - 24 hour    Equipment Recommendations  None recommended by OT    Recommendations for Other Services      Precautions / Restrictions Precautions Precautions: Fall Precaution Comments: peg and trach Required Braces or Orthoses: Other Brace Splint/Cast - Date Prophylactic Dressing Applied (if applicable): 40/76/80 Other Brace: RLE soft knee brace and LUE soft elbow brace (wearing schedule in RN orders); bil PRAFOs Restrictions Weight Bearing Restrictions: No       Mobility Bed Mobility Overal bed mobility: Needs Assistance Bed Mobility: Rolling;Sidelying to  Sit Rolling: Total assist Sidelying to sit: Total assist;+2 for safety/equipment       General bed mobility comments: total assist to roll towards L side and ascend to EOB    Transfers Overall transfer level: Needs assistance Equipment used: 2 person hand held assist Transfers: Squat Pivot Transfers     Squat pivot transfers: Total assist;+2 physical assistance;From elevated surface     General transfer comment: +2 assist for squat pivot bed to chair. Bed pad utilized for support. Increased time required to adjust sitting position, but once positioned properly in the tilt-in-space wheelchair, pt able to maintain midline sitting posture.    Balance Overall balance assessment: Needs assistance Sitting-balance support: No upper extremity supported;Feet supported Sitting balance-Leahy Scale: Poor Sitting balance - Comments: In tilt-in-space wheelchair, pt was able to maintain midline sitting posture. Postural control: Right lateral lean                                 ADL either performed or assessed with clinical judgement   ADL                                               Vision   Additional Comments: Pt consistently look toward people and cars passing by on her Rt while outside   Perception     Praxis      Cognition Arousal/Alertness: Awake/alert Behavior During Therapy: Flat affect Overall Cognitive Status: Impaired/Different from baseline Area  of Impairment: Following commands;Problem solving                 Orientation Level: Disoriented to;Place;Time;Situation (seems to recognize family, especially responsive to her husband, Charlene [name] when he is here) Current Attention Level: Focused Memory: Decreased short-term memory Following Commands: Follows one step commands inconsistently;Follows one step commands with increased time Safety/Judgement: Decreased awareness of safety;Decreased awareness of deficits Awareness:  Intellectual Problem Solving: Slow processing;Decreased initiation;Requires verbal cues;Requires tactile cues General Comments: Pt alert and looking around (mainly to the R) when outside. Pt especially looking towards the R when cars would drive by. Tracking mom mostly during session. Appeared to move eyes to the L to command, and at end of session spontaneously turning head (slowly) to the L.        Exercises Other Exercises Other Exercises: Gentle cervical rotation stretch 30" x2   Shoulder Instructions       General Comments Pt transferred to tilt in space w/c and was transported outside with mom present.  Pt tolerated being upright well    Pertinent Vitals/ Pain       Pain Assessment: Faces Faces Pain Scale: No hurt Pain Location: Pt reaching for R knee towards end of session. Pillows placed under knees in wheelchair for comfort and pt appeared improved. Pain Descriptors / Indicators: Grimacing;Guarding;Moaning Pain Intervention(s): Repositioned;Premedicated before session  Home Living                                          Prior Functioning/Environment              Frequency  Min 2X/week        Progress Toward Goals  OT Goals(current goals can now be found in the care plan section)  Progress towards OT goals: Goals met and updated - see care plan  Acute Rehab OT Goals Patient Stated Goal: Pt unable to state OT Goal Formulation: With family Time For Goal Achievement: 12/28/20 Potential to Achieve Goals: [place] Discharge plan remains appropriate;Frequency remains appropriate    Co-evaluation    PT/OT/SLP Co-Evaluation/Treatment: Yes Reason for Co-Treatment: Complexity of the patient's impairments (multi-system involvement);Necessary to address cognition/behavior during functional activity;For patient/therapist safety;To address functional/ADL transfers PT goals addressed during session: Mobility/safety with  mobility;Balance;Strengthening/ROM OT goals addressed during session: ADL's and self-care      AM-PAC OT "6 Clicks" Daily Activity     Outcome Measure   Help from another person eating meals?: Total Help from another person taking care of personal grooming?: Total Help from another person toileting, which includes using toliet, bedpan, or urinal?: Total Help from another person bathing (including washing, rinsing, drying)?: Total Help from another person to put on and taking off regular upper body clothing?: Total Help from another person to put on and taking off regular lower body clothing?: Total 6 Click Score: 6    End of Session    OT Visit Diagnosis: Muscle weakness (generalized) (M62.81);Apraxia (R48.2);Cognitive communication deficit (R41.841);Hemiplegia and hemiparesis Symptoms and signs involving cognitive functions: Other Nontraumatic ICH Hemiplegia - Right/Left: Right 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

## 2020-12-14 NOTE — Plan of Care (Signed)
  Problem: Coping: Goal: Will identify appropriate support needs Outcome: Progressing   Problem: Health Behavior/Discharge Planning: Goal: Ability to manage health-related needs will improve Outcome: Progressing   Problem: Self-Care: Goal: Verbalization of feelings and concerns over difficulty with self-care will improve Outcome: Progressing Goal: Ability to communicate needs accurately will improve Outcome: Progressing   Problem: Nutrition: Goal: Risk of aspiration will decrease Outcome: Progressing   Problem: Intracerebral Hemorrhage Tissue Perfusion: Goal: Complications of Intracerebral Hemorrhage will be minimized Outcome: Progressing

## 2020-12-14 NOTE — Progress Notes (Signed)
Physical Therapy Progress Note  Assessment: Pt seen again with OT to assist back to bed from tilt-in-space wheelchair. Pt appeared comfortable in the chair in no apparent distress, however pt's mom concerned about pt's comfort in wheelchair. Upon return to bed, performed R knee and L elbow stretching and soft braces applied. Pt able to smoothly alternate between attending to OT and PT. Will continue to follow and progress as able per POC.     12/14/20 1427  PT Visit Information  Last PT Received On 12/14/20  Assistance Needed +2  PT/OT/SLP Co-Evaluation/Treatment Yes  Reason for Co-Treatment Complexity of the patient's impairments (multi-system involvement);Necessary to address cognition/behavior during functional activity;For patient/therapist safety;To address functional/ADL transfers  PT goals addressed during session Mobility/safety with mobility;Balance;Strengthening/ROM  History of Present Illness Pt is 55 y/o female presents to [facility] on 08/26/20 with L sided weakness and headaches. 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 s/p Right frontal ventricular catheter placement on 2/10, stopped functioning on 2/11.  MRI 2/11 shows right greater than left mesial temporal lobes / parahippocampal cortex, bilateral basal ganglia, and possibly the upper pons that are concerning for acute infarcts, most likely secondary to mass effect from the hemorrhage. ETT 2/10-2/24.  Pt with trach and PEG on 09/10/20. Hospital course complicated by persistent fever, Serratia pneumonia and now pre-orbital cellulitis.   PMH includes anxiety, HTN.  Subjective Data  Subjective Mom present throughout session  Patient Stated Goal Pt unable to state  Precautions  Precautions Fall  Precaution Comments peg and trach  Required Braces or Orthoses Other Brace  Other Brace RLE soft knee brace  and LUE soft elbow brace (wearing schedule in RN orders); bil PRAFOs  Restrictions  Weight Bearing Restrictions No  Pain Assessment  Pain Assessment Faces  Faces Pain Scale 4  Pain Location R knee and L elbow with stretching  Pain Descriptors / Indicators Restless  Pain Intervention(s) Repositioned  Cognition  Arousal/Alertness Awake/alert  Behavior During Therapy Flat affect  Overall Cognitive Status Impaired/Different from baseline  Area of Impairment Following commands;Problem solving  Orientation Level  (seems to recognize family, especially responsive to her husband, [name] when he is here)  Current Attention Level Focused  Awareness Intellectual  Problem Solving Slow processing;Decreased initiation;Requires verbal cues;Requires tactile cues  General Comments pt purposefully reached for therapist x 2. She reached for and retrieved lotion container on command then gave it back to therapist on command  Difficult to assess due to Tracheostomy  Bed Mobility  Overal bed mobility Needs Assistance  Bed Mobility Rolling;Sit to Sidelying  Rolling Total assist  Sit to sidelying Total assist;+2 for physical assistance;+2 for safety/equipment  General bed mobility comments Pt required assist for all aspects  Transfers  Overall transfer level Needs assistance  Equipment used 2 person hand held assist  Transfers Squat Pivot Transfers  Squat pivot transfers Total assist;+2 physical assistance;From elevated surface  General transfer comment total A +2 provided to transfer pt back to bed from w/c  Ambulation/Gait  General Gait Details non ambulatory  Modified Rankin (Stroke Patients Only)  Pre-Morbid Rankin Score 0  Modified Rankin 5  Balance  Overall balance assessment Needs assistance  Sitting-balance support No upper extremity supported;Feet supported  Sitting balance-Leahy Scale Poor  Sitting balance - Comments In tilt-in-space wheelchair, pt was able to maintain midline sitting  posture.  Postural control Right lateral lean  Other Exercises  Other Exercises Rt knee passively stretched into extension then splint applied  Other Exercises Lt elbow passively stretched into extension and brace applied  PT - End of Session  Equipment Utilized During Treatment Gait belt  Activity Tolerance Patient tolerated treatment well  Patient left in bed;with call bell/phone within reach;with family/visitor present  Nurse Communication Mobility status   PT - Assessment/Plan  PT Plan Current plan remains appropriate  PT Visit Diagnosis Hemiplegia and hemiparesis;Other symptoms and signs involving the nervous system (R29.898);Other abnormalities of gait and mobility (R26.89)  Hemiplegia - Right/Left Right  Hemiplegia - dominant/non-dominant Dominant  Hemiplegia - caused by Cerebral infarction;Nontraumatic intracerebral hemorrhage;Other Nontraumatic intracranial hemorrhage  PT Frequency (ACUTE ONLY) Min 2X/week  Recommendations for Other Services Rehab consult  Follow Up Recommendations SNF  PT equipment Hospital bed;Wheelchair cushion (measurements PT);Wheelchair (measurements PT);Other (comment) (18x18 tilt in space WC, hoyer lift)  AM-PAC PT "6 Clicks" Mobility Outcome Measure (Version 2)  Help needed turning from your back to your side while in a flat bed without using bedrails? 1  Help needed moving from lying on your back to sitting on the side of a flat bed without using bedrails? 1  Help needed moving to and from a bed to a chair (including a wheelchair)? 1  Help needed standing up from a chair using your arms (e.g., wheelchair or bedside chair)? 1  Help needed to walk in hospital room? 1  Help needed climbing 3-5 steps with a railing?  1  6 Click Score 6  Consider Recommendation of Discharge To: CIR/SNF/LTACH  PT Goal Progression  Progress towards PT goals Progressing toward goals  Acute Rehab PT Goals  PT Goal Formulation Patient unable to participate in goal setting   Time For Goal Achievement 12/28/20  Potential to Achieve Goals Poor  PT Time Calculation  PT Start Time (ACUTE ONLY) 1220  PT Stop Time (ACUTE ONLY) 1245  PT Time Calculation (min) (ACUTE ONLY) 25 min  PT General Charges  $$ ACUTE PT VISIT 1 Visit  PT Treatments  $Therapeutic Activity 8-22 mins   Conni Slipper, PT, DPT Acute Rehabilitation Services Pager: [phone] Office: [phone]

## 2020-12-14 NOTE — Progress Notes (Signed)
  Speech Language Pathology Treatment: Cognitive-Linquistic;Dysphagia  Patient Details Name: Charlene [name] MRN: [id number] DOB: Jun 29, 1966 Today's Date: 12/14/2020 Time: 6301-6010 SLP Time Calculation (min) (ACUTE ONLY): 32.6 min  Assessment / Plan / Recommendation Clinical Impression  Pt was sitting in tilt-in-space wheelchair after session with OT/PT. Her mother was present, giving her a pedicure.  Coni was alert, using PMV upon entering room, and voicing consistently throughout session.  When provided with a choice of applesauce vs yogurt in right visual field, she reached out with RUE and grasped yogurt.  Using adaptable spoon, she initially required hand-over-hand assist to reach lips with spoon tip.  By end of session, she focused gaze on yogurt and aimed spoon toward container to load with yogurt. She brought spoon toward lips, watching it and opening her mouth in anticipation of its arrival. She was able to reach her mouth independently on two occasions. With regard to swallowing, she consumed eight boluses of yogurt with excellent attention, active oral movement, and palpable swallow.  She was unable to draw through a straw during trials of water; she was able to hold cup with RUE and bring to lips with physical assist.  She followed several simple commands in context today - these were related to giving and accepting items from clinician with RUE.    Pt is making gains- they are small but discernible.  Will continue SLP for communication/swallowing.    HPI HPI: Pt is 55 y/o female presents to [facility] on 2/9 with L sided weakness and headaches. PMH includes anxiety, HTN. 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 s/p Right frontal ventricular catheter placement on 2/10, stopped functioning on 2/11.  MRI 2/11 shows right greater than left mesial temporal  lobes / parahippocampal cortex, bilateral basal ganglia, and possibly the upper pons that are concerning for acute infarcts, most likely secondary to mass effect from the hemorrhage. ETT 2/10; trach/PEG 09/10/20.  Trach changed to #6 cuffless 3/11. Hospital course complicated by persistent fever,  Serratia pneumonia and pre-orbital cellulitis.      SLP Plan  Continue with current plan of care       Recommendations  Diet recommendations: NPO                Oral Care Recommendations: Oral care QID Follow up Recommendations: Skilled Nursing facility SLP Visit Diagnosis: Aphonia (R49.1);Dysphagia, unspecified (R13.10);Cognitive communication deficit (R41.841) Plan: Continue with current plan of care       GO                Charlene [name] 12/14/2020, 12:22 PM  Charlene [name] L. Samson [name], MA CCC/SLP Acute Rehabilitation Services Office number [phone] Pager [phone]

## 2020-12-15 LAB — GLUCOSE, CAPILLARY
Glucose-Capillary: 96 mg/dL (ref 70–99)
Glucose-Capillary: 124 mg/dL — ABNORMAL HIGH (ref 70–99)
Glucose-Capillary: 112 mg/dL — ABNORMAL HIGH (ref 70–99)
Glucose-Capillary: 100 mg/dL — ABNORMAL HIGH (ref 70–99)
Glucose-Capillary: 117 mg/dL — ABNORMAL HIGH (ref 70–99)

## 2020-12-15 NOTE — Progress Notes (Signed)
CSW was notified that the incompetency hearing is scheduled for 01/28/21 at 2pm with [facility]. The patient's assigned guardian ad litem is Melburn Popper at [phone].  CSW sent updated clinicals to [place] at [facility] in [place] for review.  Edwin Dada, MSW, LCSW Transitions of Care  Clinical Social Worker II [phone]

## 2020-12-15 NOTE — Progress Notes (Signed)
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  [name] remains obtunded/altered.  Currently has PEG and trach in place.  Hospital course complicated by persistent fever, Serratia pneumonia and preorbital cellulitis, ESBL Enterobacter UTI.  [name] with hypertonicity, adjustments to medications started in March.  Currently on Provigil to help with alertness.  [name] was evaluated by PMR physician, Provigil dose was increased.  Continues to have minimal interaction.  Currently pending placement. Assessment & Plan   Multiple acute strokes/right thalamus ICH with IVH/obstructive hydrocephalus/metabolic encephalopathy -Currently on baclofen and Provigil -Has had some issues with diarrhea -PMR physician reevaluated [name] and felt that [name] has not made meaningful functional gains over the last month and remains SNF appropriate -PT OT and speech continue to follow [name] -supposedly was trying to feed herself on 5/27- have not appreciated this  -Per PT and OT notes [name] is making more purposeful movements, and reaching for objects  Acute hypoxic respiratory failure secondary to CVA -[name] required vent and currently has trach in place.  Downsized to #4 cuffless on 5/8 -PCCM/trach team was following [name] however feels she is not a candidate for decannulation given her inability to manage her oral secretions -[name] was last seen on 5/23, capping trials were attempted for 48 hours however [name] was found to be profoundly weak and minimally responsive, and is at high risk of high risk of death from aspiration  Dysphagia -Continue tube feeds, Prosource and free water flushes  Bilateral knee  osteoarthritis -Has been a chronic problem, treated with Tylenol and voltaren gel -ibuprofen added along with PPI   Recurrent diarrhea -Resolved -Suspect due to combination of antibiotics, tube feeds, as well as laxatives  which have now been changed to as needed  Hypertensive emergency/malignant hypertension -[name] was not taking her medications prior to admission (Coreg, HCTZ) -BP stable, continue amlodipine, metoprolol, hydralazine  Right upper extremity thrombophlebitis -Ultrasound showed age-indeterminate SVT of the right cephalic vein  Neck contracture/torticollis -Resolved  ESBL Enterobacter UTI (4/8) / Klebsiella pneumoniae UTI (5/20) -Klebsiella resistant to nitrofurantoin and ampicillin -[name] completed 5 days of Merrem -Continue oxybutynin for bladder spasm  Bacterial conjunctivitis/periorbital cellulitis -Resolved  Chronic pansinusitis/aspiration pneumonia with recurrent fevers -Hospital-acquired -[name] has completed several courses of antibiotics during hospitalization -CT noted chronic but stable and resolving sinusitis  Chronic Anemia -Stable, last hemoglobin 10.5 (12/14/20) -Continue to monitor CBC periodically  Hyperlipidemia -Continue statin  DVT Prophylaxis  SCDs  Code Status: Full  Family Communication: None at bedside  Disposition Plan:  Status is: Inpatient  Remains inpatient appropriate because:Unsafe d/c plan   Dispo: The [name] is from: Home              Anticipated d/c is to: SNF              [name] currently is not medically stable to d/c.   Difficult to place [name] Yes    Consultants Neurology  [facility] PCCM Palliative care  Procedures  Tracheostomy PEG tube placement EEG Echocardiogram  Antibiotics   Anti-infectives (From admission, onward)   Start     Dose/Rate Route Frequency Ordered Stop   12/06/20 1500  cefTRIAXone (ROCEPHIN) 1 g in sodium chloride 0.9 % 100 mL IVPB        1 g 200 mL/hr  over 30  Minutes Intravenous Every 24 hours 12/06/20 1404 12/10/20 1500   12/04/20 1400  meropenem (MERREM) 1 g in sodium chloride 0.9 % 100 mL IVPB  Status:  Discontinued        1 g 200 mL/hr over 30 Minutes Intravenous Every 8 hours 12/04/20 1207 12/06/20 1404   10/27/20 0845  meropenem (MERREM) 1 g in sodium chloride 0.9 % 100 mL IVPB  Status:  Discontinued        1 g 200 mL/hr over 30 Minutes Intravenous Every 8 hours 10/27/20 0752 11/01/20 0720   10/26/20 1145  cefTRIAXone (ROCEPHIN) 1 g in sodium chloride 0.9 % 100 mL IVPB  Status:  Discontinued        1 g 200 mL/hr over 30 Minutes Intravenous Every 24 hours 10/26/20 1056 10/27/20 0752   10/14/20 1815  Ampicillin-Sulbactam (UNASYN) 3 g in sodium chloride 0.9 % 100 mL IVPB        3 g 200 mL/hr over 30 Minutes Intravenous Every 6 hours 10/14/20 1754 10/19/20 1340   10/08/20 0845  ciprofloxacin (CIPRO) tablet 500 mg  Status:  Discontinued        500 mg Per Tube 2 times daily 10/08/20 0754 10/14/20 1731   09/21/20 1545  ceFAZolin (ANCEF) IVPB 1 g/50 mL premix        1 g 100 mL/hr over 30 Minutes Intravenous Every 8 hours 09/21/20 1457 09/28/20 0647   09/20/20 0600  ceFAZolin (ANCEF) IVPB 1 g/50 mL premix  Status:  Discontinued        1 g 100 mL/hr over 30 Minutes Intravenous Every 8 hours 09/14/20 0903 09/14/20 0904   09/16/20 1300  vancomycin (VANCOREADY) IVPB 1250 mg/250 mL  Status:  Discontinued        1,250 mg 166.7 mL/hr over 90 Minutes Intravenous Every 24 hours 09/15/20 1232 09/17/20 0956   09/15/20 1230  vancomycin (VANCOREADY) IVPB 1250 mg/250 mL        1,250 mg 166.7 mL/hr over 90 Minutes Intravenous  Once 09/15/20 1131 09/15/20 1402   09/13/20 1400  ceFEPIme (MAXIPIME) 2 g in sodium chloride 0.9 % 100 mL IVPB        2 g 200 mL/hr over 30 Minutes Intravenous Every 8 hours 09/13/20 1341 09/19/20 2208   09/06/20 0600  vancomycin (VANCOREADY) IVPB 1250 mg/250 mL        1,250 mg 166.7 mL/hr over 90 Minutes Intravenous  Once 09/06/20  0541 09/06/20 0739   09/05/20 1400  ceFAZolin (ANCEF) IVPB 1 g/50 mL premix  Status:  Discontinued        1 g 100 mL/hr over 30 Minutes Intravenous Every 8 hours 09/05/20 1120 09/13/20 1341   08/30/20 0600  vancomycin (VANCOREADY) IVPB 500 mg/100 mL  Status:  Discontinued        500 mg 100 mL/hr over 60 Minutes Intravenous Every 12 hours 08/29/20 1554 09/01/20 1115   08/29/20 1700  ceFEPIme (MAXIPIME) 2 g in sodium chloride 0.9 % 100 mL IVPB  Status:  Discontinued        2 g 200 mL/hr over 30 Minutes Intravenous Every 12 hours 08/29/20 1554 09/01/20 1115   08/29/20 1645  vancomycin (VANCOREADY) IVPB 1250 mg/250 mL        1,250 mg 166.7 mL/hr over 90 Minutes Intravenous  Once 08/29/20 1554 08/29/20 1820      Subjective:   Rawan Riendeau seen and examined today.  [name] can state hi, however not very communicative.  Objective:  Vitals:   12/14/20 2359 12/15/20 0400 12/15/20 0729 12/15/20 0825  BP:  137/81 135/72 135/72  Pulse: 66 87 79 86  Resp: _0 Temp:  98 F (36.7 C) 98.4 F (36.9 C)   TempSrc:  Axillary Axillary   SpO2: 98% 97% 96% 98%  Weight:  54.9 kg    Height:        Intake/Output Summary (Last 24 hours) at 12/15/2020 1141 Last data filed at 12/15/2020 0400 Gross per 24 hour  Intake 4510 ml  Output --  Net 4510 ml   Filed Weights   12/13/20 0700 12/14/20 0400 12/15/20 0400  Weight: 56 kg 56.6 kg 54.9 kg    Exam  General: Well developed, chronically ill-appearing, NAD  HEENT: NCAT, mucous membranes moist.   Neck: Trach in place  Cardiovascular: S1 S2 auscultated, RRR  Respiratory: Diminished breath sounds anteriorly  Abdomen: Soft, nontender, nondistended, + bowel sounds, PEG tube in place  Extremities: warm dry without cyanosis clubbing or edema.  Bilateral atrophy of thigh and calf muscles  Neuro: Awake however cannot assess alertness or orientation at this time.  [name] minimally tracking in the room, not following commands for me  today.  Data Reviewed: I have personally reviewed following labs and imaging studies  CBC: Recent Labs  Lab 12/14/20 0522  WBC 4.6  HGB 10.5*  HCT 34.1*  MCV 81.2  PLT 841   Basic Metabolic Panel: Recent Labs  Lab 12/14/20 0522  NA 140  K 3.9  CL 101  CO2 31  GLUCOSE 124*  BUN 13  CREATININE 0.53  CALCIUM 9.4   GFR: Estimated Creatinine Clearance: 70.5 mL/min (by C-G formula based on SCr of 0.53 mg/dL). Liver Function Tests: Recent Labs  Lab 12/14/20 0522  AST 24  ALT 37  ALKPHOS 100  BILITOT 0.5  PROT 6.6  ALBUMIN 3.1*   No results for input(s): LIPASE, AMYLASE in the last 168 hours. No results for input(s): AMMONIA in the last 168 hours. Coagulation Profile: No results for input(s): INR, PROTIME in the last 168 hours. Cardiac Enzymes: No results for input(s): CKTOTAL, CKMB, CKMBINDEX, TROPONINI in the last 168 hours. BNP (last 3 results) No results for input(s): PROBNP in the last 8760 hours. HbA1C: No results for input(s): HGBA1C in the last 72 hours. CBG: Recent Labs  Lab 12/14/20 1549 12/14/20 1952 12/14/20 2337 12/15/20 0400 12/15/20 0754  GLUCAP 104* 106* 126* 96 124*   Lipid Profile: No results for input(s): CHOL, HDL, LDLCALC, TRIG, CHOLHDL, LDLDIRECT in the last 72 hours. Thyroid Function Tests: No results for input(s): TSH, T4TOTAL, FREET4, T3FREE, THYROIDAB in the last 72 hours. Anemia Panel: No results for input(s): VITAMINB12, FOLATE, FERRITIN, TIBC, IRON, RETICCTPCT in the last 72 hours. Urine analysis:    Component Value Date/Time   COLORURINE STRAW (A) 12/04/2020 1030   APPEARANCEUR TURBID (A) 12/04/2020 1030   LABSPEC 1.010 12/04/2020 1030   PHURINE 7.0 12/04/2020 1030   GLUCOSEU NEGATIVE 12/04/2020 1030   HGBUR NEGATIVE 12/04/2020 1030   BILIRUBINUR NEGATIVE 12/04/2020 1030   KETONESUR NEGATIVE 12/04/2020 1030   PROTEINUR NEGATIVE 12/04/2020 1030   UROBILINOGEN 0.2 02/21/2014 1629   NITRITE NEGATIVE 12/04/2020 1030    LEUKOCYTESUR TRACE (A) 12/04/2020 1030   Sepsis Labs: _1 (procalcitonin:4,lacticidven:4)  ) No results found for this or any previous visit (from the past 240 hour(s)).    Radiology Studies: No results found.   Scheduled Meds: . acetaminophen  650 mg Oral Q4H   Or  .  acetaminophen (TYLENOL) oral liquid 160 mg/5 mL  650 mg Per Tube Q4H   Or  . acetaminophen  650 mg Rectal Q4H  . amLODipine  10 mg Per Tube Daily  . atorvastatin  40 mg Per Tube Daily  . baclofen  7.5 mg Per Tube 2 times per day  . chlorhexidine gluconate (MEDLINE KIT)  15 mL Mouth Rinse BID  . Chlorhexidine Gluconate Cloth  6 each Topical Daily  . diclofenac Sodium  4 g Topical QID  . feeding supplement (PROSource TF)  45 mL Per Tube Daily  . free water  100 mL Per Tube Q4H  . hydrALAZINE  50 mg Per Tube Q8H  . ibuprofen  400 mg Per Tube Q8H  . insulin aspart  0-9 Units Subcutaneous Q4H  . mouth rinse  15 mL Mouth Rinse 5 X Daily  . metoprolol tartrate  100 mg Per Tube BID  . modafinil  200 mg Per Tube Daily  . oxybutynin  5 mg Per Tube BID  . pantoprazole sodium  40 mg Per Tube Daily  . tiZANidine  2 mg Per Tube QHS   Continuous Infusions: . sodium chloride Stopped (10/26/20 1445)  . feeding supplement (OSMOLITE 1.2 CAL) 1,000 mL (12/14/20 2138)     LOS: 110 days   Time Spent in minutes   15 minutes  Jermani Eberlein D.O. on 12/15/2020 at 11:41 AM  Between 7am to 7pm - Please see pager noted on [email or web address]  After 7pm go to [email or web address]  And look for the night coverage person covering for me after hours  [facility] Office  [phone]

## 2020-12-16 LAB — GLUCOSE, CAPILLARY
Glucose-Capillary: 101 mg/dL — ABNORMAL HIGH (ref 70–99)
Glucose-Capillary: 106 mg/dL — ABNORMAL HIGH (ref 70–99)
Glucose-Capillary: 118 mg/dL — ABNORMAL HIGH (ref 70–99)
Glucose-Capillary: 120 mg/dL — ABNORMAL HIGH (ref 70–99)
Glucose-Capillary: 128 mg/dL — ABNORMAL HIGH (ref 70–99)
Glucose-Capillary: 130 mg/dL — ABNORMAL HIGH (ref 70–99)

## 2020-12-16 NOTE — Progress Notes (Signed)
[facility] PROGRESS NOTE  Charlene [name] SVX:[id number] DOB: 01-31-1966 DOA: 08/26/2020 PCP: Patient, [name] (Inactive)    October 08, 2020     10/16/2020                        10/16/2020     10/18/2020                        10/20/2020    Status: Remains inpatient appropriate because:Altered mental status, Unsafe d/c plan, IV treatments appropriate due to intensity of illness or inability to take PO and Inpatient level of care appropriate due to severity of illness   Dispo: The patient is from: Home              Anticipated d/c is to: SNF trach capable-[place] but no [place] available-mental status remains variable and as of 5/23 unable to decannulate per trach team evaluation.              Patient currently is medically stable to d/c.   Difficult to place patient Yes   Level of care: Telemetry Medical  Code Status: Full Family Communication: [name] [phone]) is her POA and legal contact.  Charlene [name] was at bedside on 5/18 and given update.  [name] at bedside on 5/30 DVT prophylaxis: Chronically bedbound.  5/6: We will discontinue prophylactic Lovenox   Vaccination status: Unvaccinated-have discussed both with patient's mother and with [name].  He wishes to speak to the rest of the family before making a decision but is leaning towards [name] vaccine.  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   Subsequently on 2/10, she developed obtundation and respiratory arrest leading to intubation.  She has remained persistently obtunded, now has a trach and a PEG.  Hospital course complicated by persistent fever, Serratia pneumonia and now pre-orbital cellulitis  As of 3/25 with adjustments in hypertonicity medications patient began to improve regarding hypertonicity.  Eventually started on provigil with improvements in alertness.  Evaluated by rehab physician who recommended increasing provigil dose further.  Subsequently patient has become more alert.  Most days she is able to track and with encouragement able to follow simple commands especially since partial obstruction glasses placed.  See progress note dictated 4/27 for expanded details.   2/09 Admit for [place] 2/10 EVD placed, later intubated due to obtundation and respiratory arrest 2/11 MRI Brain, hypertonic saline d/c'd, EVD not working.  Husband did not consent to PICC  2/13 EVD removed 2/17 [place] discussion with family, PMT 2/24 Bedside trach and PEG 3/1 Transferred to [facility]  Subjective: Sleeping soundly and did not awaken to voice or tactile stimulation.  Objective: Vitals:   12/16/20 0416 12/16/20 0757  BP: (!) 142/87   Pulse: 69 84  Resp: 17 19  Temp: 98 F (36.7 C)   SpO2: 100% 98%    Intake/Output Summary (Last 24 hours) at 12/16/2020 0827  Last data filed at 12/16/2020 0416 Gross per 24 hour  Intake 1899 ml  Output 900 ml  Net 999 ml   Filed Weights   12/13/20 0700 12/14/20 0400 12/15/20 0400  Weight: 56 kg 56.6 kg 54.9 kg    Exam:  Constitutional: Sleeping soundly Respiratory: #4.0 cuffless trach, trach collar with FiO2 21% at 5L, anterior lung sounds are clear.  No increased work of breathing while sleeping and no tracheal secretions detected Cardiovascular: Normal heart sounds, regular pulse, no peripheral edema. Abdomen:  PEG tube -LBM 5/29, normoactive bowel sounds.  Abdomen nondistended and does not appear to be  tender with palpation. Musculoskeletal: Bilateral atrophy of thigh and calf muscles-there are over both knees and as of today knees are no longer tender to palpation Neurologic: Awake, left facial droop.  Some gross motor purposeful movement of extremities right greater than left.  Nonverbal.  No spasticity. Psych: Leaping.  Assessment/Plan: Acute problems: Multiple acute strokes  Right Thalamus ICH with IVH  Obstructive Hydrocephalus/Acute on persistent metabolic Encephalopathy/brain injury related hypertonicity -Continue baclofen and Provigil -Continue PT/OT/SLP; repeat team documents purposeful actions during therapies including reaching for objects and attempting to feed self with assistance/assistive devices -5/2, CIR MD reevaluated patient " Charlene [name] unfortunately has not made any meaningful functional gains over the last month. She remains most appropriate for SNF level care".   Bilateral knee osteoarthritis -History of chronic knee pain secondary to osteoarthritis -Continue Voltaren gel; continue scheduled Tylenol and ibuprofen total 7 days duration  Hypertensive emergency/Malignant HTN -Due to financial constraints patient was not taking carvedilol and hydrochlorothiazide prior to admission -Continue Norvasc, Lopressor and hydralazine.   Acute hypoxemic respiratory failure secondary to CVA requiring mechanical ventilation (Resolved) Trach dependent -Downsized to #4 cuffless trach (5/8) -Despite tolerating capping trials trach team documents her baseline encephalopathy and poor cough mechanics preclude decannulation given extreme risk of airway compromise (death from aspiration).  They state she is not a candidate for decannulation and as of 5/30 they have signed off  Dysphagia -Continue tube feedings, Prosource liquid, free water -Follow CBGs and provide SSI      Other problems: Right upper extremity thrombophlebitis - [name] -> age indeterminate SVT of right cephalic  vein  Neck Contracture/torticollis -- Resolved  ESBL positive Enterobacter UTI -Has completed 5 days of meropenem -Contact isolation during hospitalization -continue Oxybutynin suspected bladder spasms  Klebsiella UTI -Resistant to nitrofurantoin and ampicillin -Meropenem transitioned to Rocephin on 5/22 -To minimize risk of recurrent UTI have asked that pure wick only be used at bedtime on a 10 PM to 8 AM schedule  Recurrent diarrhea -Resolved -Likely combination of recent antibiotics as well as scheduled laxative -MiraLAX now as needed  Bacterial Conjunctivitis /periorbital cellulitis -Resolved  Chronic pansinusitis/recurrent fevers 2/2 aspiration PNA;History of Serratia Pneumonia  Hospital Acquired Pneumonia:  -Completed several courses of antibiotic -Follow-up imaging including sinus CT consistent with chronic but stable and resolving sinusitis   Anemia -Stable  HLD -Continue Lipitor      Data Reviewed: Basic Metabolic Panel: Recent Labs  Lab 12/14/20 0522  NA 140  K 3.9  CL 101  CO2 31  GLUCOSE 124*  BUN 13  CREATININE 0.53  CALCIUM 9.4   Liver Function Tests: Recent Labs  Lab 12/14/20 0522  AST 24  ALT 37  ALKPHOS 100  BILITOT 0.5  PROT 6.6  ALBUMIN 3.1*   CBC: Recent Labs  Lab 12/14/20 0522  WBC 4.6  HGB 10.5*  HCT 34.1*  MCV 81.2  PLT 286    CBG: Recent Labs  Lab 12/15/20 1600 12/15/20 2024 12/16/20 0025 12/16/20 0416 12/16/20 0816  GLUCAP 100* 117* 128* 106* 120*     Studies: No results found.  Scheduled Meds: . acetaminophen  650 mg Oral Q4H   Or  . acetaminophen (TYLENOL) oral liquid 160 mg/5 mL  650 mg Per Tube Q4H   Or  . acetaminophen  650 mg Rectal Q4H  . amLODipine  10 mg Per Tube Daily  . atorvastatin  40 mg Per Tube Daily  . baclofen  7.5 mg Per Tube 2 times per day  . chlorhexidine gluconate (MEDLINE KIT)  15 mL Mouth Rinse BID  . Chlorhexidine Gluconate Cloth  6 each Topical Daily  . diclofenac  Sodium  4 g Topical QID  . feeding supplement (PROSource TF)  45 mL Per Tube Daily  . free water  100 mL Per Tube Q4H  . hydrALAZINE  50 mg Per Tube Q8H  . ibuprofen  400 mg Per Tube Q8H  . insulin aspart  0-9 Units Subcutaneous Q4H  . mouth rinse  15 mL Mouth Rinse 5 X Daily  . metoprolol tartrate  100 mg Per Tube BID  . modafinil  200 mg Per Tube Daily  . oxybutynin  5 mg Per Tube BID  . pantoprazole sodium  40 mg Per Tube Daily  . tiZANidine  2 mg Per Tube QHS   Continuous Infusions: . sodium chloride Stopped (10/26/20 1445)  . feeding supplement (OSMOLITE 1.2 CAL) 1,000 mL (12/15/20 1811)    Principal Problem:   ICH (intracerebral hemorrhage) ([place]) Active Problems:   Acute hypoxemic respiratory failure ([place])   Status post tracheostomy ([place])   Palliative care by specialist   Muscle hypertonicity   Oropharyngeal dysphagia   Poor prognosis   Aspiration pneumonia of right lower lobe ([place])   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   Acute cystitis without hematuria   Consultants:  Neurology   Neurosurgery.   Palliative care.    PCCM continues to follow.  No decannulation.   Procedures:  Tracheostomy  PEG tube  EEG  Echocardiogram  Antibiotics: Vancomycin 2/12-2/15 Maxipime 2/12-2/15 Ancef 2/19-2/27 Vancomycin 2/20 x 1 dose Maxipime 2/27- 3/5 Vancomycin 3/1-3/3 Ancef 3/6-3/14 Cipro 3/20 4-3/30 Unasyn 3/30-4/4 Rocephin 4/11-4/12 Meropenem 4/12-4/17   Time spent: 25 minutes    Erin Hearing ANP  [facility] 7 am - 330 pm/M-F for direct patient care and secure chat Please refer to [place] for contact info 111  Days

## 2020-12-16 NOTE — Progress Notes (Signed)
  Speech Language Pathology Treatment: Dysphagia;Cognitive-Linquistic  Patient Details Name: Charlene [name] MRN: [id number] DOB: October 29, 1965 Today's Date: 12/16/2020 Time: 1110-1200 SLP Time Calculation (min) (ACUTE ONLY): 50 min  Assessment / Plan / Recommendation Clinical Impression  Co-treated with OT to facilitate mobility/participation due to complexity.  Assisted OT to transfer pt to tilt-in-space chair.  Dtr-in-law present.  PMV placed - pt voicing upon exhalation with occasional modulations in sound production. Tolerating valve with #4 cuffless [place] without difficulty.  Pt responded to cues to reach for and release items (containers, spoon, cup, [place])  to clinician with improved spontaneity and initiation.  When offered choice of pudding or yogurt, she actively reached for pudding with RUE after she was observed to shift gaze between the two items. She continued to initiate movement to self feed (see OT note), actively beginning oral-motor movements before spoon arrived at lips.  She manipulated POs (pudding and water) with mild spillage of water from left side of mouth, but no s/s of aspiration.    Charlene [name] is demonstrating more interaction with her environment - pushing objects away or reaching for items with RUE. She is demonstrating more variety of facial expressions today.  Overall making gains.  Continue SLP.    HPI HPI: Pt is 55 y/o female presents to [facility] on 2/9 with L sided weakness and headaches. PMH includes anxiety, HTN. 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 s/p Right frontal ventricular catheter placement on 2/10, stopped functioning on 2/11.  MRI 2/11 shows right greater than left mesial temporal lobes / parahippocampal cortex, bilateral basal ganglia, and possibly the upper pons that are concerning for acute infarcts, most likely  secondary to mass effect from the hemorrhage. ETT 2/10; trach/PEG 09/10/20.  Trach changed to #6 cuffless 3/11. Hospital course complicated by persistent fever,  Serratia pneumonia and pre-orbital cellulitis.      SLP Plan  Continue with current plan of care       Recommendations  Diet recommendations: NPO (therapeutic trials with SLP) Liquids provided via: Cup Medication Administration: Via alternative means      Patient may use Passy-Muir Speech Valve: Intermittently with supervision;Caregiver trained to provide supervision;During all therapies with supervision         Oral Care Recommendations: Oral care QID Follow up Recommendations: Skilled Nursing facility SLP Visit Diagnosis: Aphonia (R49.1);Dysphagia, unspecified (R13.10);Cognitive communication deficit (R41.841) Plan: Continue with current plan of care       GO               Charlene [name] L. Samson [name], MA CCC/SLP Acute Rehabilitation Services Office number [phone] Pager [phone]  Charlene [name] [name] 12/16/2020, 1:12 PM

## 2020-12-16 NOTE — Progress Notes (Addendum)
CSW placed copy of legal documentation in patient's chart. CSW spoke with Lurena Joiner, RN to provide her with an update.  Edwin Dada, MSW, LCSW Transitions of Care  Clinical Social Worker II [phone]

## 2020-12-16 NOTE — Progress Notes (Addendum)
Occupational Therapy Treatment Patient Details Name: Charlene [name] MRN: [id number] DOB: [birth year] Today's Date: 12/16/2020    History of present illness Pt is 55 y/o female presents to [facility] on 08/26/20 with L sided weakness and headaches. 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 s/p Right frontal ventricular catheter placement on 2/10, stopped functioning on 2/11.  MRI 2/11 shows right greater than left mesial temporal lobes / parahippocampal cortex, bilateral basal ganglia, and possibly the upper pons that are concerning for acute infarcts, most likely secondary to mass effect from the hemorrhage. ETT 2/10-2/24.  Pt with trach and PEG on 09/10/20. Hospital course complicated by persistent fever, Serratia pneumonia and now pre-orbital cellulitis.   PMH includes anxiety, HTN.   OT comments  Pt seen in conjunction with SLP.  She was transferred to w/c.  She was given a choice of yogurt or chocolate pudding and very purposefully indicated that she preferred chocolate pudding.  She was able to feed self several bites of pudding with max facilitation/hand over hand assist, with pt initiating the movement.  She was also able to drink 5 sips from a cup with hand over hand assist.  She demonstrates significant spasticity, and apraxia resulting in significant incoordination which limited her ability to move spoon to her mouth.  She was also able to initiate brushing her teeth with the yaunker and hand over hand assist. She was left up in w/c with family present.    Follow Up Recommendations  SNF;Supervision/Assistance - 24 hour    Equipment Recommendations  None recommended by OT    Recommendations for Other Services      Precautions / Restrictions Precautions Precautions: Fall Precaution Comments: peg and trach Required Braces or Orthoses: Other  Brace Splint/Cast - Date Prophylactic Dressing Applied (if applicable): 12/16/20 Other Brace: RLE soft knee brace and LUE soft elbow brace (wearing schedule in RN orders); bil PRAFOs       Mobility Bed Mobility Overal bed mobility: Needs Assistance       Supine to sit: Total assist;+2 for safety/equipment          Transfers Overall transfer level: Needs assistance Equipment used: 2 person hand held assist Transfers: Lateral/Scoot Transfers          Lateral/Scoot Transfers: Total assist;+2 safety/equipment General transfer comment: pt required assist for all aspects    Balance Overall balance assessment: Needs assistance Sitting-balance support: No upper extremity supported Sitting balance-Leahy Scale: Poor                                     ADL either performed or assessed with clinical judgement   ADL Overall ADL's : Needs assistance/impaired Eating/Feeding: Maximal assistance;Sitting Eating/Feeding Details (indicate cue type and reason): co treat with SLP.  with max facilitation (pt performing up to 30-40% of task), pt was able to feed self several bites of pudding and was able to take 5 sips of water from a cup.   she was able to place the spoon in the pudding container with min A.  She is limited by apraxia, and increased tone/spasticity which limit Rt UE function Grooming: Wash/dry face;Maximal assistance Grooming Details (indicate cue type and reason): wiped mouth withi max hand over hand assist to facillitate activity.  Pt was able to retrieve the towel from  SLP with mod facillitation                                     Vision       Perception     Praxis      Cognition Arousal/Alertness: Awake/alert Behavior During Therapy: Flat affect Overall Cognitive Status: Impaired/Different from baseline Area of Impairment: Following commands;Problem solving                   Current Attention Level: Sustained    Following Commands: Follows one step commands inconsistently;Follows one step commands with increased time     Problem Solving: Slow processing;Decreased initiation;Difficulty sequencing;Requires verbal cues;Requires tactile cues General Comments: Pt able to initiate brushing teeth with yaunker, handind the yaunker back to therapist, and self feeding on command. Pt provided with choice of chocolate pudding or blueberry yogurt, and purposefully reached for the chocolate pudding in response.        Exercises     Shoulder Instructions       General Comments Family member present during session    Pertinent Vitals/ Pain       Pain Assessment: Faces Faces Pain Scale: Hurts a little bit Pain Location: R knee and L elbow with stretching Pain Descriptors / Indicators: Restless Pain Intervention(s): Repositioned  Home Living                                          Prior Functioning/Environment              Frequency  Min 2X/week        Progress Toward Goals  OT Goals(current goals can now be found in the care plan section)  Progress towards OT goals: Progressing toward goals     Plan Discharge plan remains appropriate    Co-evaluation    PT/OT/SLP Co-Evaluation/Treatment: Yes Reason for Co-Treatment: Complexity of the patient's impairments (multi-system involvement);Necessary to address cognition/behavior during functional activity;To address functional/ADL transfers   OT goals addressed during session: ADL's and self-care;Strengthening/ROM      AM-PAC OT "6 Clicks" Daily Activity     Outcome Measure   Help from another person eating meals?: A Lot Help from another person taking care of personal grooming?: A Lot Help from another person toileting, which includes using toliet, bedpan, or urinal?: Total Help from another person bathing (including washing, rinsing, drying)?: Total Help from another person to put on and taking off regular upper  body clothing?: Total Help from another person to put on and taking off regular lower body clothing?: Total 6 Click Score: 8    End of Session Equipment Utilized During Treatment: Oxygen  OT Visit Diagnosis: Muscle weakness (generalized) (M62.81);Apraxia (R48.2);Cognitive communication deficit (R41.841);Hemiplegia and hemiparesis Symptoms and signs involving cognitive functions: Other Nontraumatic ICH Hemiplegia - Right/Left: Right Hemiplegia - caused by: Other Nontraumatic intracranial hemorrhage   Activity Tolerance Patient tolerated treatment well   Patient Left in chair;with call bell/phone within reach;with family/visitor present   Nurse Communication Mobility status        Time:  -    12/16/20 1500  OT Time Calculation  OT Start Time (ACUTE ONLY) 1114  OT Stop Time (ACUTE ONLY) 1205  OT Time Calculation (min) 51 min  OT General Charges  $OT Visit 1 Visit  OT Treatments  $Neuromuscular  Re-education 23-37 mins      Charges:    Eber Jones OTR/L Acute Rehabilitation Services Pager [phone] Office [phone]    Jeani Hawking M 12/16/2020, 3:30 PM

## 2020-12-17 LAB — GLUCOSE, CAPILLARY
Glucose-Capillary: 103 mg/dL — ABNORMAL HIGH (ref 70–99)
Glucose-Capillary: 103 mg/dL — ABNORMAL HIGH (ref 70–99)
Glucose-Capillary: 104 mg/dL — ABNORMAL HIGH (ref 70–99)
Glucose-Capillary: 104 mg/dL — ABNORMAL HIGH (ref 70–99)
Glucose-Capillary: 130 mg/dL — ABNORMAL HIGH (ref 70–99)
Glucose-Capillary: 153 mg/dL — ABNORMAL HIGH (ref 70–99)

## 2020-12-17 MED ORDER — BACLOFEN 1 MG/ML ORAL SUSPENSION: 10.0000 mg | ORAL | Status: DC

## 2020-12-17 MED ORDER — BACLOFEN 1 MG/ML ORAL SUSPENSION
10.0000 mg | ORAL | Status: DC
  Administered 2020-12-17 – 2021-02-09 (×159): 10 mg
  Filled 2020-12-17 (×171): qty 10

## 2020-12-17 MED ORDER — TIZANIDINE HCL 4 MG PO TABS
4.0000 mg | ORAL_TABLET | Freq: Every day | ORAL | Status: DC
  Administered 2020-12-17 – 2021-02-08 (×54): 4 mg
  Filled 2020-12-17 (×54): qty 1

## 2020-12-17 NOTE — Progress Notes (Signed)
[facility] PROGRESS NOTE  Charlene [name] UVO:[id number] DOB: 02/24/1966 DOA: 08/26/2020 PCP: Patient, [name] (Inactive)    October 08, 2020     10/16/2020                        10/16/2020     10/18/2020                        10/20/2020    Status: Remains inpatient appropriate because:Altered mental status, Unsafe d/c plan, IV treatments appropriate due to intensity of illness or inability to take PO and Inpatient level of care appropriate due to severity of illness   Dispo: The patient is from: Home              Anticipated d/c is to: SNF trach capable-[place] but no beds available-mental status remains variable and as of 5/23 unable to decannulate per trach team evaluation.              Patient currently is medically stable to d/c.   Difficult to place patient Yes   Level of care: Telemetry Medical  Code Status: Full Family Communication: [name] [phone]) is her POA and legal contact.  [name] was at bedside on 5/18 and given update.  [name] at bedside on 5/30 DVT prophylaxis: Chronically bedbound.  5/6: We will discontinue prophylactic Lovenox   Vaccination status: Unvaccinated-have discussed both with patient's mother and with [name].  He wishes to speak to the rest of the family before making a decision but is leaning towards [name] vaccine.  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   Subsequently on 2/10, she developed obtundation and respiratory arrest leading to intubation.  She has remained persistently obtunded, now has a trach and a PEG.  Hospital course complicated by persistent fever, Serratia pneumonia and now pre-orbital cellulitis  As of 3/25 with adjustments in hypertonicity medications patient began to improve regarding hypertonicity.  Eventually started on provigil with improvements in alertness.  Evaluated by rehab physician who recommended increasing provigil dose further.  Subsequently patient has become more alert.  Most days she is able to track and with encouragement able to follow simple commands especially since partial obstruction glasses placed.  See progress note dictated 4/27 for expanded details.   2/09 Admit for [place] 2/10 EVD placed, later intubated due to obtundation and respiratory arrest 2/11 MRI Brain, hypertonic saline d/c'd, EVD not working.  Husband did not consent to PICC  2/13 EVD removed 2/17 [place] discussion with family, PMT 2/24 Bedside trach and PEG 3/1 Transferred to [facility]  Subjective: Alert, tracking this writer around room and definitely paying attention to conversation I am having with her mother in the room  Objective: Vitals:   12/17/20 0409 12/17/20 0738  BP:  (!) 143/82  Pulse:  77  Resp: 16 16  Temp:    SpO2:  97%    Intake/Output  Summary (Last 24 hours) at 12/17/2020 1610 Last data filed at 12/17/2020 0600 Gross per 24 hour  Intake 2273.83 ml  Output --  Net 2273.83 ml   Filed Weights   12/14/20 0400 12/15/20 0400 12/17/20 0500  Weight: 56.6 kg 54.9 kg 55.2 kg    Exam:  Constitutional: Alert, appears comfortable, NAD Respiratory: #4.0 cuffless trach, trach collar with FiO2 21% at 5L, lungs clear, NO INCREASED wob, NO TRACHEAL SECRETIONS Cardiovascular: S1S2, regular pulse w/o tachycardia, no peripheral edema Abdomen:  PEG tube -LBM 5/29, slightly distended-after my initial eval pt grimacing and RN felt 2/2 need to  have BM/constipation GU; Incontinent of urine-bladder scan  Musculoskeletal: Bilateral atrophy of thigh and calf muscles-there are over both knees and as of today knees are no longer tender to palpation Neurologic: Alert, spontaneous movement of RUE-intermittantly purposeful. Subtle L facial droop, minimal movement LEs. Therapies noted increased spasticity and apraxia of UEs during session 6/1 Psych: Alert with flat affect. [place] at times with eyes-occ with extremities but primarily with therapies. Nonverbal so orientation diff to assess  Assessment/Plan: Acute problems: Multiple acute strokes  Right Thalamus ICH with IVH  Obstructive Hydrocephalus/Acute on persistent metabolic Encephalopathy/brain injury related hypertonicity -Increase Baclofen to 37m TID and HS Tizanadine to 4 mg due ti increased spasticity and apraxia while working with therapies . Continue Provigil -Continue PT/OT/SLP; repeat team documents purposeful actions during therapies including reaching for objects and attempting to feed self with assistance/assistive devices -5/2, CIR MD reevaluated patient " Mrs. SRanesunfortunately has not made any meaningful functional gains over the last month. She remains most appropriate for SNF level care".   Bilateral knee osteoarthritis -History of chronic knee pain secondary to osteoarthritis -Continue Voltaren gel; continue scheduled Tylenol and ibuprofen total 7 days duration -pain much better controlled  Hypertensive emergency/Malignant HTN -Due to financial constraints patient was not taking carvedilol and hydrochlorothiazide prior to admission -Continue Norvasc, Lopressor and hydralazine.   Acute hypoxemic respiratory failure secondary to CVA requiring mechanical ventilation (Resolved) Trach dependent -Downsized to #4 cuffless trach (5/8) -Despite tolerating capping trials trach team documents her baseline encephalopathy and poor cough mechanics preclude decannulation given  extreme risk of airway compromise (death from aspiration).  They state she is not a candidate for decannulation and as of 5/30 they have signed off although recommended to call back if mental status improves ** If passes MBSS 6/3 will notify trach team to see if this would qualify as improved mentation**  Dysphagia -Continue tube feedings, Prosource liquid, free water -Follow CBGs and provide SSI -Discussed w/ SLP- since has been consistently swallowing during SLP sessions plan is to pursue MBSS 6/3      Other problems: Right upper extremity thrombophlebitis - [place]-> age indeterminate SVT of right cephalic vein  Neck Contracture/torticollis -- Resolved  ESBL positive Enterobacter UTI -Has completed 5 days of meropenem -Contact isolation during hospitalization -continue Oxybutynin suspected bladder spasms  Klebsiella UTI -Resistant to nitrofurantoin and ampicillin -Meropenem transitioned to Rocephin on 5/22 -To minimize risk of recurrent UTI have asked that pure wick only be used at bedtime on a 10 PM to 8 AM schedule  Recurrent diarrhea -Resolved -Likely combination of recent antibiotics as well as scheduled laxative -MiraLAX now as needed  Bacterial Conjunctivitis /periorbital cellulitis -Resolved  Chronic pansinusitis/recurrent fevers 2/2 aspiration PNA;History of Serratia Pneumonia  Hospital Acquired Pneumonia:  -Completed several courses of antibiotic -Follow-up imaging including sinus CT consistent with chronic but stable and resolving sinusitis   Anemia -Stable  HLD -Continue Lipitor      Data Reviewed: Basic Metabolic Panel: Recent Labs  Lab 12/14/20 0522  NA 140  K 3.9  CL 101  CO2 31  GLUCOSE 124*  BUN 13  CREATININE 0.53  CALCIUM 9.4   Liver Function Tests: Recent Labs  Lab 12/14/20 0522  AST 24  ALT 37  ALKPHOS 100  BILITOT 0.5  PROT 6.6  ALBUMIN 3.1*   CBC: Recent Labs  Lab 12/14/20 0522  WBC 4.6  HGB 10.5*  HCT 34.1*  MCV  81.2  PLT 286    CBG: Recent Labs  Lab 12/16/20 1122 12/16/20 1602 12/16/20 2008 12/17/20 0011 12/17/20 0430  GLUCAP 130* 101* 118* 130* 103*     Studies: No results found.  Scheduled Meds: . acetaminophen  650 mg Oral Q4H   Or  . acetaminophen (TYLENOL) oral liquid 160 mg/5 mL  650 mg Per Tube Q4H   Or  . acetaminophen  650 mg Rectal Q4H  . amLODipine  10 mg Per Tube Daily  . atorvastatin  40 mg Per Tube Daily  . baclofen  10 mg Per Tube 3 times per day  . chlorhexidine gluconate (MEDLINE KIT)  15 mL Mouth Rinse BID  . Chlorhexidine Gluconate Cloth  6 each Topical Daily  . diclofenac Sodium  4 g Topical QID  . feeding supplement (PROSource TF)  45 mL Per Tube Daily  . free water  100 mL Per Tube Q4H  . hydrALAZINE  50 mg Per Tube Q8H  . ibuprofen  400 mg Per Tube Q8H  . insulin aspart  0-9 Units Subcutaneous Q4H  . mouth rinse  15 mL Mouth Rinse 5 X Daily  . metoprolol tartrate  100 mg Per Tube BID  . modafinil  200 mg Per Tube Daily  . oxybutynin  5 mg Per Tube BID  . pantoprazole sodium  40 mg Per Tube Daily  . tiZANidine  4 mg Per Tube QHS   Continuous Infusions: . sodium chloride Stopped (10/26/20 1445)  . feeding supplement (OSMOLITE 1.2 CAL) 1,000 mL (12/16/20 1651)    Principal Problem:   ICH (intracerebral hemorrhage) ([place]) Active Problems:   Acute hypoxemic respiratory failure (HCC)   Status post tracheostomy ([place])   Palliative care by specialist   Muscle hypertonicity   Oropharyngeal dysphagia   Poor prognosis   Aspiration pneumonia of right lower lobe ([place])   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   Acute cystitis without hematuria   Consultants:  Neurology   Neurosurgery.   Palliative care.    PCCM continues to follow.  No decannulation.   Procedures:  Tracheostomy  PEG tube  EEG  Echocardiogram  Antibiotics: Vancomycin 2/12-2/15 Maxipime 2/12-2/15 Ancef  2/19-2/27 Vancomycin 2/20 x 1 dose Maxipime 2/27- 3/5 Vancomycin 3/1-3/3 Ancef 3/6-3/14 Cipro 3/20 4-3/30 Unasyn 3/30-4/4 Rocephin 4/11-4/12 Meropenem 4/12-4/17   Time spent: 25 minutes    Erin Hearing ANP  [facility] 7 am - 330 pm/M-F for direct patient care and secure chat Please refer to [place] for contact info 112  Days

## 2020-12-17 NOTE — Plan of Care (Signed)
  VSS, o2 sats stable via trach mask with 21% fio2 No s/s distress noted Scheduled medications administered Bladder scan =217 after post-void  Pt incontinent of bowel and bladder Primafit on and initiated,pt kept clean and dry Osmolite 1.2@55ml /hr, Q4HR flushes HOB >30 SCDs on  Will continue to monitor and follow plan   Problem: Self-Care: Goal: Verbalization of feelings and concerns over difficulty with self-care will improve Outcome: Not Progressing   Problem: Self-Care: Goal: Ability to communicate needs accurately will improve Outcome: Not Progressing   Problem: Coping: Goal: Will identify appropriate support needs Outcome: Progressing   Problem: Intracerebral Hemorrhage Tissue Perfusion: Goal: Complications of Intracerebral Hemorrhage will be minimized Outcome: Progressing   Problem: Ischemic Stroke/TIA Tissue Perfusion: Goal: Complications of ischemic stroke/TIA will be minimized Outcome: Progressing

## 2020-12-17 NOTE — Progress Notes (Signed)
Patient voided at 11am, voided a second time which was small and unmeasured, a third void large unmeasured occurred around 5:30 pm. ABD is distended, nontender. PRN Miralax was given. No BM this shift, Urine culture was sent to the lab. Will continue to monitor and pass along in report.

## 2020-12-17 NOTE — Progress Notes (Signed)
Physical Therapy Treatment Patient Details Name: Charlene [name] MRN: [id number] DOB: 05/12/1966 Today's Date: 12/17/2020    History of Present Illness Pt is 55 y/o female presents to [facility] on 08/26/20 with L sided weakness and headaches. 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 s/p Right frontal ventricular catheter placement on 2/10, stopped functioning on 2/11.  MRI 2/11 shows right greater than left mesial temporal lobes / parahippocampal cortex, bilateral basal ganglia, and possibly the upper pons that are concerning for acute infarcts, most likely secondary to mass effect from the hemorrhage. ETT 2/10-2/24.  Pt with trach and PEG on 09/10/20. Hospital course complicated by persistent fever, Serratia pneumonia and now pre-orbital cellulitis.   PMH includes anxiety, HTN.    PT Comments    Pt seen for tilt trials into standing. Max angle of 55 achieved, and pt in tilt for ~25 minutes this session. Pt gave therapist thumbs up to command x2, however not able to use thumbs up as a consistent way to communicate yet. Pt especially fixated on remote for the bed, grabbing it with the RUE x4 and attempting to use thumb to press the buttons. Pt handed the remote back to therapist on command at end of session. Will continue to follow and progress as able per POC.     Follow Up Recommendations  SNF     Equipment Recommendations  Hospital bed;Wheelchair cushion (measurements PT);Wheelchair (measurements PT);Other (comment) (18x18 tilt in space WC, hoyer lift)    Recommendations for Other Services Rehab consult     Precautions / Restrictions Precautions Precautions: Fall Precaution Comments: peg and trach Required Braces or Orthoses: Other Brace Other Brace: RLE soft knee brace and LUE soft elbow brace (wearing schedule in RN orders); bil  PRAFOs Restrictions Weight Bearing Restrictions: No    Mobility  Bed Mobility Overal bed mobility: Needs Assistance Bed Mobility: Rolling Rolling: Total assist         General bed mobility comments: Pt required assist for all aspects Start Time: 1145 Angle: 55 degrees (max angle) Total Minutes in Angle: 25 minutes Patient Response: Flat affect  Transfers                    Ambulation/Gait             General Gait Details: non ambulatory   Stairs             Wheelchair Mobility    Modified Rankin (Stroke Patients Only) Modified Rankin (Stroke Patients Only) Pre-Morbid Rankin Score: No symptoms Modified Rankin: Severe disability     Balance       Sitting balance - Comments: Not tested Postural control: Right lateral lean     Standing balance comment: Pt strapped into tilt bed in standing. Noted pt able to keep head and shoulders mostly midline with mild R lateral lean over time.                            Cognition Arousal/Alertness: Awake/alert Behavior During Therapy: Flat affect Overall Cognitive Status: Impaired/Different from baseline Area of Impairment: Following commands;Problem solving                 Orientation Level:  (seems to recognize family, especially responsive to her husband, [name] when he is here) Current Attention Level: Sustained Memory: Decreased short-term memory Following  Commands: Follows one step commands inconsistently;Follows one step commands with increased time Safety/Judgement: Decreased awareness of safety;Decreased awareness of deficits Awareness: Intellectual Problem Solving: Slow processing;Decreased initiation;Difficulty sequencing;Requires verbal cues;Requires tactile cues General Comments: Pt gave thumbs up to command x2 and with purposeful reaching throughout session. Pt especially curious about the remote for the bed, and reached up to grab it x4, the last time handing it back to  therapist on command.      Exercises Other Exercises Other Exercises: B knee stretch into extension Other Exercises: Gentle cervical rotation stretch 30" x2 Other Exercises: Gentle cervical lateral flexion stretch bilaterally 2x30"    General Comments        Pertinent Vitals/Pain Pain Assessment: Faces Faces Pain Scale: Hurts even more Pain Location: LE stretching into knee extension and hip extension to neutral. Pain Descriptors / Indicators: Restless Pain Intervention(s): Limited activity within patient's tolerance;Monitored during session;Repositioned    Home Living                      Prior Function            PT Goals (current goals can now be found in the care plan section) Acute Rehab PT Goals Patient Stated Goal: Pt unable to state PT Goal Formulation: Patient unable to participate in goal setting Time For Goal Achievement: 12/28/20 Potential to Achieve Goals: Poor Progress towards PT goals: Progressing toward goals    Frequency    Min 2X/week      PT Plan Current plan remains appropriate    Co-evaluation              AM-PAC PT "6 Clicks" Mobility   Outcome Measure  Help needed turning from your back to your side while in a flat bed without using bedrails?: Total Help needed moving from lying on your back to sitting on the side of a flat bed without using bedrails?: Total Help needed moving to and from a bed to a chair (including a wheelchair)?: Total Help needed standing up from a chair using your arms (e.g., wheelchair or bedside chair)?: Total Help needed to walk in hospital room?: Total Help needed climbing 3-5 steps with a railing? : Total 6 Click Score: 6    End of Session Equipment Utilized During Treatment: Gait belt Activity Tolerance: Patient tolerated treatment well Patient left: in bed;with call bell/phone within reach;with family/visitor present Nurse Communication: Mobility status PT Visit Diagnosis: Hemiplegia and  hemiparesis;Other symptoms and signs involving the nervous system (R29.898);Other abnormalities of gait and mobility (R26.89) Hemiplegia - Right/Left: Right Hemiplegia - dominant/non-dominant: Dominant Hemiplegia - caused by: Cerebral infarction;Nontraumatic intracerebral hemorrhage;Other Nontraumatic intracranial hemorrhage     Time: 1125-1222 PT Time Calculation (min) (ACUTE ONLY): 57 min  Charges:  $Therapeutic Exercise: 8-22 mins $Therapeutic Activity: 38-52 mins                     Conni Slipper, PT, DPT Acute Rehabilitation Services Pager: [phone] Office: [phone]    Marylynn Pearson 12/17/2020, 2:11 PM

## 2020-12-17 NOTE — Progress Notes (Signed)
Speech Pathology Pt has demonstrated improved frequency of spontaneous swallowing and better participation in therapeutic PO trials with SLP.  Will plan to proceed with MBS next date to determine potential for liberalizing PO intake with staff/family. D/W A. Rennis Harding, NP.  Lory Nowaczyk L. Samson [name], MA CCC/SLP Acute Rehabilitation Services Office number [phone] Pager [phone]

## 2020-12-18 ENCOUNTER — Inpatient Hospital Stay (HOSPITAL_COMMUNITY): Payer: Medicaid Other

## 2020-12-18 LAB — CBC WITH DIFFERENTIAL/PLATELET
Abs Immature Granulocytes: 0.01 10*3/uL (ref 0.00–0.07)
Basophils Absolute: 0 10*3/uL (ref 0.0–0.1)
Basophils Relative: 0 %
Eosinophils Absolute: 0.1 10*3/uL (ref 0.0–0.5)
Eosinophils Relative: 1 %
HCT: 35 % — ABNORMAL LOW (ref 36.0–46.0)
Hemoglobin: 11 g/dL — ABNORMAL LOW (ref 12.0–15.0)
Immature Granulocytes: 0 %
Lymphocytes Relative: 42 %
Lymphs Abs: 2.3 10*3/uL (ref 0.7–4.0)
MCH: 25.3 pg — ABNORMAL LOW (ref 26.0–34.0)
MCHC: 31.4 g/dL (ref 30.0–36.0)
MCV: 80.5 fL (ref 80.0–100.0)
Monocytes Absolute: 0.3 10*3/uL (ref 0.1–1.0)
Monocytes Relative: 6 %
Neutro Abs: 2.8 10*3/uL (ref 1.7–7.7)
Neutrophils Relative %: 51 %
Platelets: 315 10*3/uL (ref 150–400)
RBC: 4.35 MIL/uL (ref 3.87–5.11)
RDW: 15.2 % (ref 11.5–15.5)
WBC: 5.5 10*3/uL (ref 4.0–10.5)
nRBC: 0 % (ref 0.0–0.2)

## 2020-12-18 LAB — BASIC METABOLIC PANEL
Anion gap: 7 (ref 5–15)
BUN: 16 mg/dL (ref 6–20)
CO2: 31 mmol/L (ref 22–32)
Calcium: 9.5 mg/dL (ref 8.9–10.3)
Chloride: 101 mmol/L (ref 98–111)
Creatinine, Ser: 0.49 mg/dL (ref 0.44–1.00)
GFR, Estimated: >60 mL/min (ref >60)
Glucose, Bld: 106 mg/dL — ABNORMAL HIGH (ref 70–99)
Potassium: 3.7 mmol/L (ref 3.5–5.1)
Sodium: 139 mmol/L (ref 135–145)

## 2020-12-18 LAB — URIC ACID: Uric Acid, Serum: 2.6 mg/dL (ref 2.5–7.1)

## 2020-12-18 LAB — GLUCOSE, CAPILLARY
Glucose-Capillary: 110 mg/dL — ABNORMAL HIGH (ref 70–99)
Glucose-Capillary: 112 mg/dL — ABNORMAL HIGH (ref 70–99)
Glucose-Capillary: 114 mg/dL — ABNORMAL HIGH (ref 70–99)
Glucose-Capillary: 116 mg/dL — ABNORMAL HIGH (ref 70–99)
Glucose-Capillary: 127 mg/dL — ABNORMAL HIGH (ref 70–99)
Glucose-Capillary: 98 mg/dL (ref 70–99)

## 2020-12-18 LAB — C-REACTIVE PROTEIN: CRP: 0.7 mg/dL (ref <1.0)

## 2020-12-18 LAB — MAGNESIUM: Magnesium: 1.9 mg/dL (ref 1.7–2.4)

## 2020-12-18 IMAGING — DX DG ABDOMEN 1V
2 series · 2 of 2 positions shown · non-contrast
Comparison: None.

CLINICAL DATA: 54-year-old female with distended abdomen,
constipation.

EXAM:
ABDOMEN - 1 VIEW

[abdomen supine (1 of 2)]
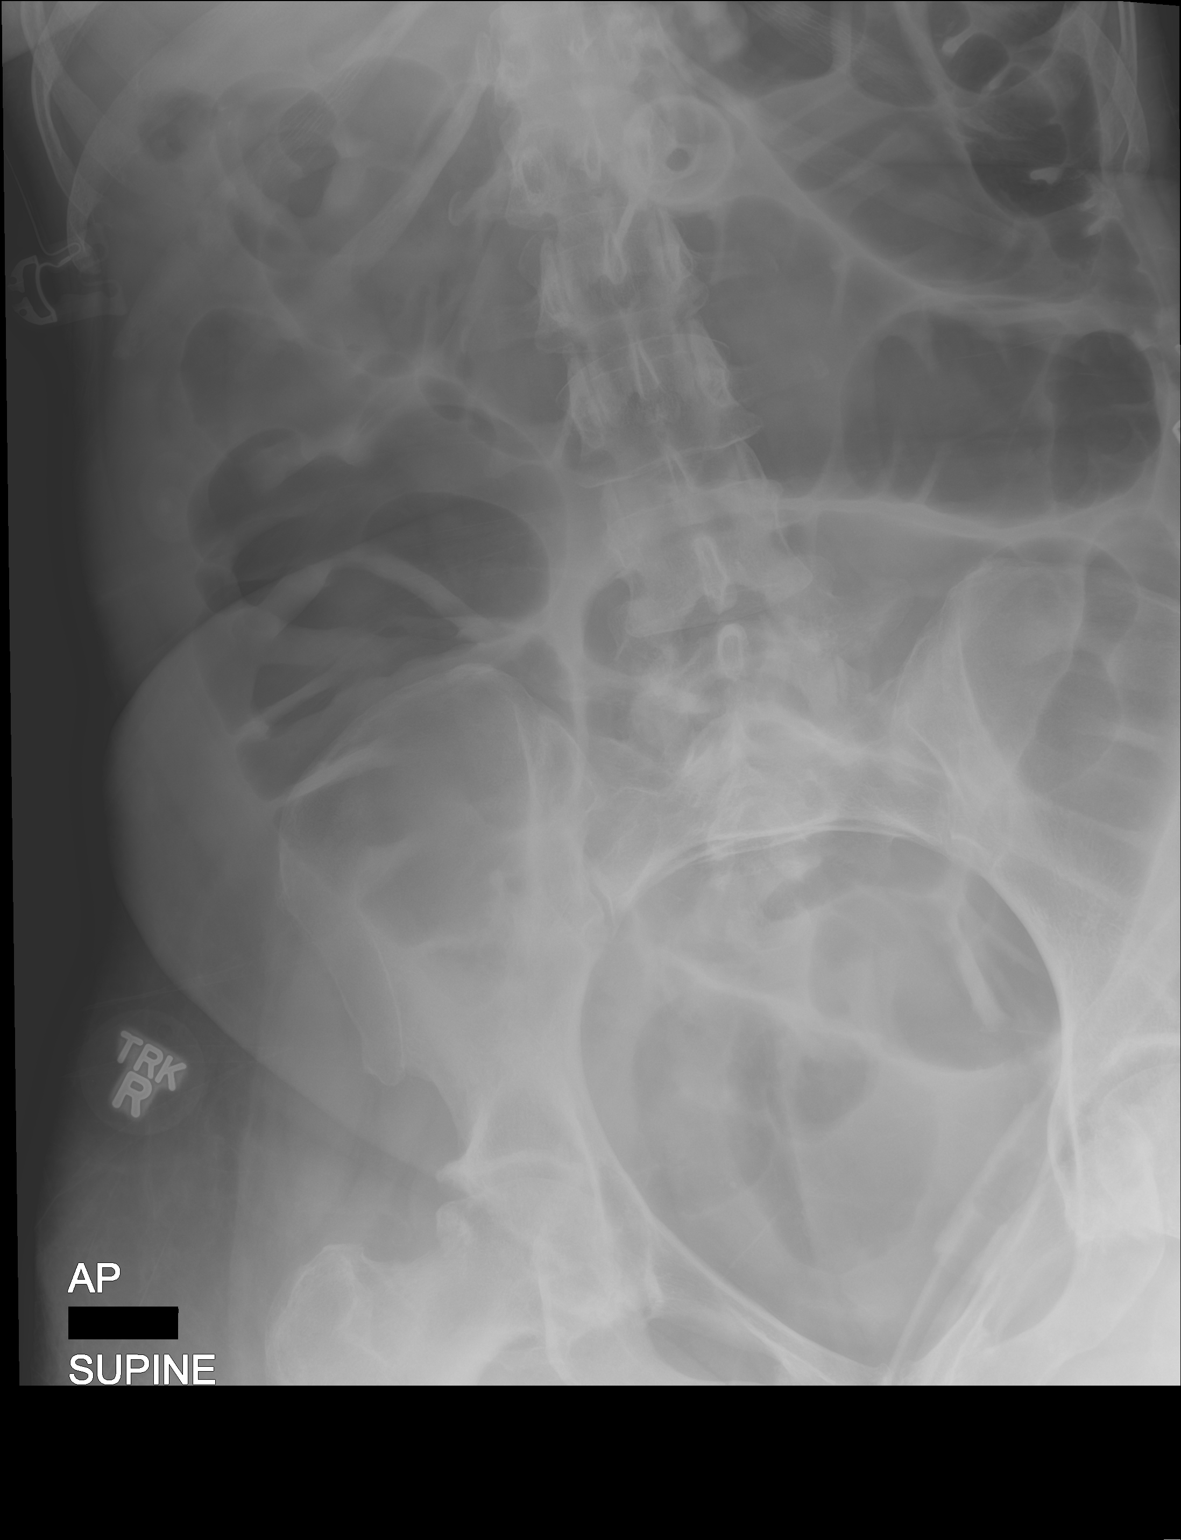

[abdomen supine (2 of 2)]
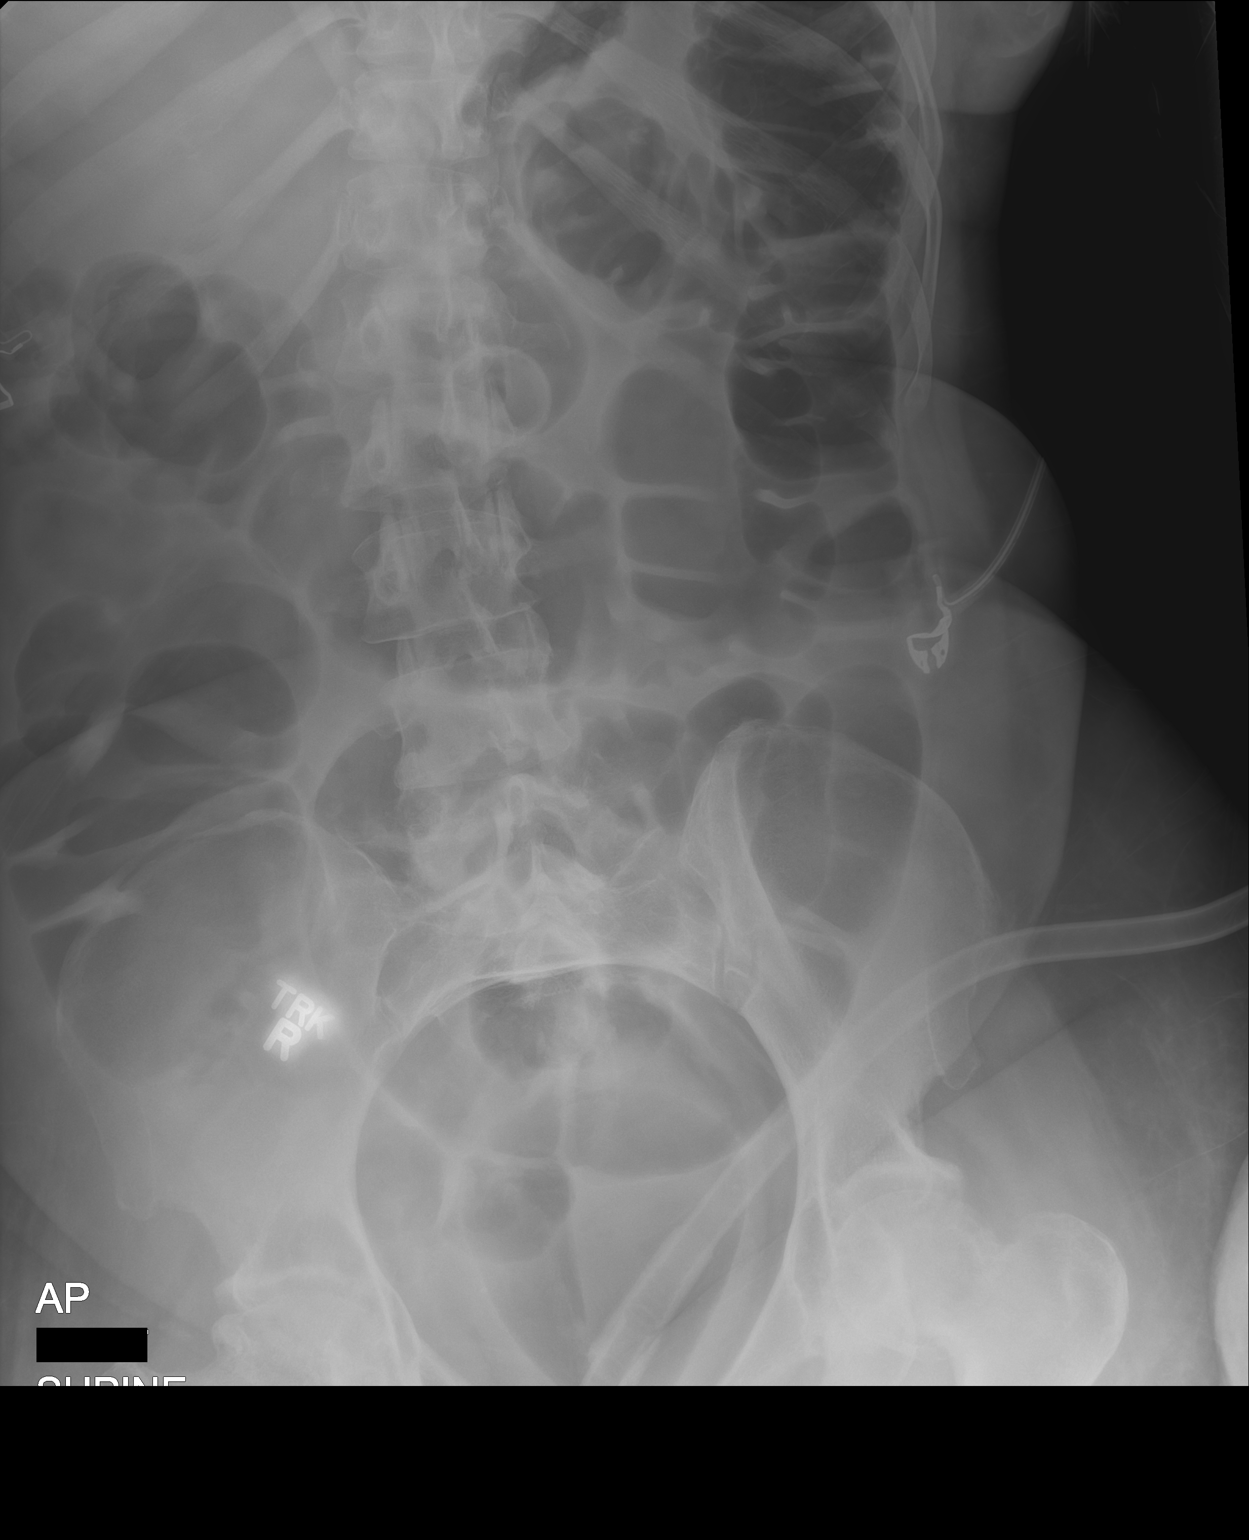

[2 of 2 positions shown; findings below may reference images not displayed]

FINDINGS: Portable AP supine views at [FK] hours. Gastrostomy tube in the mid
upper abdomen. Diffuse gas-filled large bowel loops throughout the
abdomen and pelvis. Cecum and transverse colon most distended, 7-8
cm. No dilated small bowel. No definite pneumoperitoneum on these
supine views. Mild scoliosis. Chronic hip joint degeneration. No
acute osseous abnormality identified.
IMPRESSION: Diffuse gas-filled large bowel loops throughout the abdomen and
pelvis, most compatible with colonic ileus.

## 2020-12-18 MED ORDER — LABETALOL HCL 5 MG/ML IV SOLN
10.0000 mg | INTRAVENOUS | Status: DC | PRN
  Administered 2020-12-18 – 2021-03-02 (×6): 10 mg via INTRAVENOUS
  Filled 2020-12-18 (×10): qty 4

## 2020-12-18 MED ORDER — PREDNISONE 20 MG PO TABS
20.0000 mg | ORAL_TABLET | Freq: Every day | ORAL | Status: AC
  Administered 2020-12-19 – 2020-12-22 (×4): 20 mg
  Filled 2020-12-18 (×4): qty 1

## 2020-12-18 MED ORDER — FREE WATER
200.0000 mL | Status: DC
  Administered 2020-12-18 – 2021-04-16 (×709): 200 mL

## 2020-12-18 MED ORDER — KETOROLAC TROMETHAMINE 10 MG PO TABS
10.0000 mg | ORAL_TABLET | Freq: Once | ORAL | Status: AC
  Administered 2020-12-18: 10 mg
  Filled 2020-12-18: qty 1

## 2020-12-18 MED ORDER — POLYETHYLENE GLYCOL 3350 17 G PO PACK
17.0000 g | PACK | Freq: Every day | ORAL | Status: DC
  Administered 2020-12-20: 17 g
  Filled 2020-12-18 (×5): qty 1

## 2020-12-18 MED ORDER — SODIUM CHLORIDE 0.9 % IV SOLN
1.0000 g | Freq: Three times a day (TID) | INTRAVENOUS | Status: DC
  Administered 2020-12-18 – 2020-12-19 (×3): 1 g via INTRAVENOUS
  Filled 2020-12-18 (×5): qty 1

## 2020-12-18 MED ORDER — TAMSULOSIN HCL 0.4 MG PO CAPS
0.4000 mg | ORAL_CAPSULE | Freq: Every day | ORAL | Status: DC
  Administered 2020-12-18: 0.4 mg via ORAL
  Filled 2020-12-18 (×2): qty 1

## 2020-12-18 MED ORDER — PREDNISONE 20 MG PO TABS: 20.0000 mg | ORAL_TABLET | Freq: Every day | ORAL | Status: DC

## 2020-12-18 NOTE — Progress Notes (Signed)
Nutrition Follow-up  DOCUMENTATION CODES:  Not applicable  INTERVENTION:  Continue Tube feeding via PEG tube: -Osmolite 1.2 at 55 ml/h (1320 ml per day) -45 ml ProSource TF daily  Provides 1624 kcal, 84 gm protein, 1070 ml free water daily  -200 ml free water every 4 hours    NUTRITION DIAGNOSIS:  Inadequate oral intake related to inability to eat as evidenced by NPO status. -- ongoing  GOAL:  Patient will meet greater than or equal to 90% of their needs -- Met with TF  MONITOR:  TF tolerance,Vent status  REASON FOR ASSESSMENT:  Consult,Ventilator Enteral/tube feeding initiation and management  ASSESSMENT:  Pt with PMH significant for HTN admitted with with 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  Subsequently on 2/10, pt developed obtundation and respiratory arrest leading to intubation. Pt is now s/p trach/PEG placement. Hospital course complicated by Serratia pneumonia, ESBL Enterobacter UTI and pre-orbital cellulitis.  2/10 s/p EVD placement and intubation for respiratory arrest 2/13 EVD removed 2/17 palliative care consult; family requesting tx to [facility]  2/24 s/p trach and PEG  3/1 tx to [facility] 3/11 trach changed to [place] #6 cuffless 4/08 pt w/ sepsis 2/2 UTI 4/18 28% ATC/5L 5/2 CIR reevaluated pt and determined she has not made enough improvement for admit; recommended SNF 5/8 trach changed to shiley #4 cuffless  5/23 CCM evaluated and determined pt stilll unable to decannulate due to variable mental status  Per MD, pt with colonic ileus. No N/V, abdominal pain improved after BMs. Pt now on miralax daily and FWF increased to 200cc. Per MD, no plans to hold TF at this time. Monitoring for worsening of GI symptoms. Receiving the following via PEG: Osmolite 1.2 at 55 ml/h w/ 45 ml ProSource TF daily, 247m free water flushes Q4H   UOP: 505mx24 hours  Stool: 2x unmeasured stool  occurrences x24 hours  Medications: SSI Q4H, protonix, miralax, deltasone Labs reviewed. CBGs [id number] Order:   Diet Order            Diet NPO time specified  Diet effective now                EDUCATION NEEDS:  No education needs have been identified at this time  Skin:  Skin Assessment: Reviewed RN Assessment (MASD bilateral groin)  Last BM:  6/2  Height:  Ht Readings from Last 1 Encounters:  10/18/20 '5\' 5"'  (1.651 m)   Weight:  Wt Readings from Last 1 Encounters:  12/17/20 55.2 kg   Ideal Body Weight:  56.8 kg  BMI:  Body mass index is 20.25 kg/m.  Estimated Nutritional Needs:  Kcal:  1600-1800 Protein:  80-90 grams Fluid:  >1.6L/d    AmLarkin InaMS, RD, LDN RD pager number and weekend/on-call pager number located in [place]

## 2020-12-18 NOTE — Progress Notes (Addendum)
[facility] PROGRESS NOTE  Charlene [name] OIB:[id number] DOB: 1965/10/11 DOA: 08/26/2020 PCP: Patient, [name] (Inactive)    October 08, 2020     10/16/2020                        10/16/2020     10/18/2020                        10/20/2020    Status: Remains inpatient appropriate because:Altered mental status, Unsafe d/c plan, IV treatments appropriate due to intensity of illness or inability to take PO and Inpatient level of care appropriate due to severity of illness   Dispo: The patient is from: Home              Anticipated d/c is to: SNF trach capable-[place] but no beds available-mental status remains variable and as of 5/23 unable to decannulate per trach team evaluation.              Patient currently is not medically stable to d/c.   Difficult to place patient Yes   Level of care: Telemetry Medical  Code Status: Full Family Communication: husband Charlene [name] [phone]) is her POA and legal contact.  [name] was at bedside on 5/18 and given update.  [name] at bedside on 5/30 DVT prophylaxis: Chronically bedbound.  5/6: We will discontinue prophylactic Lovenox   Vaccination status: Unvaccinated-have discussed both with patient's mother and with husband Charlene [name].  He wishes to speak to the rest of the family before making a decision but is leaning towards [name] vaccine.  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   Subsequently on 2/10, she developed obtundation and respiratory arrest leading to intubation.  She has remained persistently obtunded, now has a trach and a PEG.  Hospital course complicated by persistent fever, Serratia pneumonia and now pre-orbital cellulitis  As of 3/25 with adjustments in hypertonicity medications patient began to improve regarding hypertonicity.  Eventually started on provigil with improvements in alertness.  Evaluated by rehab physician who recommended increasing provigil dose further.  Subsequently patient has become more alert.  Most days she is able to track and with encouragement able to follow simple commands especially since partial obstruction glasses placed.  See progress note dictated 4/27 for expanded details.   2/09 Admit for [place] 2/10 EVD placed, later intubated due to obtundation and respiratory arrest 2/11 MRI Brain, hypertonic saline d/c'd, EVD not working.  Husband did not consent to PICC  2/13 EVD removed 2/17 [place] discussion with family, PMT 2/24 Bedside trach and PEG 3/1 Transferred to [facility]  Subjective: Very alert today.  Grimacing and grabbing for right knee.  Mother at bedside and clarifies patient's knee problems.  States sometime in the past year prior to admission patient actually had an injury and was evaluated and was informed she had a ligamentous tear and would require surgery.  Objective: Vitals:  12/18/20 0444 12/18/20 0817  BP: (!) 159/91   Pulse: 79 79  Resp: 17 17  Temp: 98.7 F (37.1 C)   SpO2: 98% 99%    Intake/Output Summary (Last 24 hours) at 12/18/2020 0839 Last data filed at 12/18/2020 0445 Gross per 24 hour  Intake 830 ml  Output 250 ml  Net 580 ml   Filed Weights   12/14/20 0400 12/15/20 0400 12/17/20 0500  Weight: 56.6 kg 54.9 kg 55.2 kg    Exam:  Constitutional: Alert, restless, appears uncomfortable secondary to knee pain. Respiratory: #4.0 cuffless trach, trach collar with FiO2 21% at 5L, lungs are clear to  auscultation.  Normal respiratory effort.  Asked patient to produce spontaneous cough but was unable to follow simple commands.  Staff and family in room state patient able to produce spontaneous cough.  No tracheal secretions Cardiovascular: Heart sounds are normal, hypertensive in the context of pain.  Regular slightly tachycardic pulse.  No peripheral edema. Abdomen:  PEG tube tube feeding-LBM 6/2 for laxatives, [place] soft and slightly distended but nontender. Musculoskeletal: Bilateral atrophy of thigh and calf muscles-there are over both knees and as of today knees are no longer tender to palpation Neurologic: Alert, spontaneous movement of RUE-intermittantly purposeful. Subtle L facial droop, minimal movement LEs. Therapies noted increased spasticity and apraxia of UEs during session 6/1 Psych: Awake and alert.  Tracks persons moving in room as well as people speaking to her.  Was unable to follow simple commands and remains nonverbal although was moaning in pain during exam.  Assessment/Plan: Acute problems: Multiple acute strokes  Right Thalamus ICH with IVH  Obstructive Hydrocephalus/Acute on persistent metabolic Encephalopathy/brain injury related hypertonicity -Increase Baclofen to 22m TID and HS Tizanadine to 4 mg due ti increased spasticity and apraxia while working with therapies . Continue Provigil -Continue PT/OT/SLP; repeat team documents purposeful actions during therapies including reaching for objects and attempting to feed self with assistance/assistive devices -5/2, CIR MD reevaluated patient " Mrs. SZeoliunfortunately has not made any meaningful functional gains over the last month. She remains most appropriate for SNF level care".   Colonic Ileus -no emesis-abd pain better after BMs -Increase Miralax to sch daily -increase free water to 200 cc -No nausea or emesis so will not hold tube feedings at this time but will continue to monitor for worsening of GI  symptoms  Bilateral knee osteoarthritis/reported ligamentous injury to right knee -Continue Voltaren gel; -Has completed 7 days of continue scheduled Tylenol and ibuprofen  -pain initial much better controlled now that above medications completed patient having significant pain without any redness, joint effusion or erythema -We will give one-time dose of Toradol prior to and begin low-dose prednisone 20 mg daily for 4 days -At this time trying to avoid narcotics since they will worsen ileus as well as alter patient's mentation especially with upcoming swallow evaluation. -CRP and uric acid normal -Consider MRI of knee to determine degree of injury-patient had outpatient imaging at time of injury but this is not available in our system  Hypertensive emergency/Malignant HTN -Due to financial constraints patient was not taking carvedilol and hydrochlorothiazide prior to admission -Continue Norvasc, Lopressor and hydralazine.   Acute hypoxemic respiratory failure secondary to CVA requiring mechanical ventilation (Resolved) Trach dependent -Downsized to #4 cuffless trach (5/8) -Despite tolerating capping trials trach team documents her baseline encephalopathy and poor cough mechanics preclude decannulation given extreme risk of airway compromise (death from aspiration).  They state she is not a candidate for decannulation  and as of 5/30 they have signed off although recommended to call back if mental status improves **Unfortunately had trace aspiration during MBSS and unfortunately did not manifest a cough response but is doing well enough to begin/yogurt/pudding from staff and family**unless she makes significant headway with her cough response it is doubtful she will ever be decannulated  Dysphagia -Continue tube feedings, Prosource liquid, free water -Follow CBGs and provide SSI -Discussed w/ SLP- since has been consistently swallowing during SLP sessions plan is to pursue MBSS 6/3  History  of urinary retention/recurrent gram-negative UTI -Oxybutynin discontinued yesterday and instead we will use Flomax daily.  This will also help with elevated blood pressure -I/O catheterization completed with 500 cc of urine obtained.  Have asked staff to repeat again in 6 hours and if remains greater than 500 cc will need to place Foley catheter -Culture obtained on 6/2 positive for 50,000 colonies of gram-negative rods -Recently treated for Klebsiella UTI resistant to nitrofurantoin and ampicillin-completed 3 days of Rocephin -This hospitalization has also been treated for ESBL positive Enterobacter UTI and was treated with meropenem -Will initiate meropenem empirically until final cx available-may not have cleared previous Klebsiella     Other problems: Right upper extremity thrombophlebitis - [name] -> age indeterminate SVT of right cephalic vein  Neck Contracture/torticollis -- Resolved  ESBL positive Enterobacter UTI -Has completed 5 days of meropenem -Contact isolation during hospitalization -continue Oxybutynin suspected bladder spasms  Klebsiella UTI -Resistant to nitrofurantoin and ampicillin -Meropenem transitioned to Rocephin on 5/22 -To minimize risk of recurrent UTI have asked that pure wick only be used at bedtime on a 10 PM to 8 AM schedule  Recurrent diarrhea -Resolved -Likely combination of recent antibiotics as well as scheduled laxative -MiraLAX now as needed  Bacterial Conjunctivitis /periorbital cellulitis -Resolved  Chronic pansinusitis/recurrent fevers 2/2 aspiration PNA;History of Serratia Pneumonia  Hospital Acquired Pneumonia:  -Completed several courses of antibiotic -Follow-up imaging including sinus CT consistent with chronic but stable and resolving sinusitis   Anemia -Stable  HLD -Continue Lipitor      Data Reviewed: Basic Metabolic Panel: Recent Labs  Lab 12/14/20 0522  NA 140  K 3.9  CL 101  CO2 31  GLUCOSE 124*  BUN 13   CREATININE 0.53  CALCIUM 9.4   Liver Function Tests: Recent Labs  Lab 12/14/20 0522  AST 24  ALT 37  ALKPHOS 100  BILITOT 0.5  PROT 6.6  ALBUMIN 3.1*   CBC: Recent Labs  Lab 12/14/20 0522  WBC 4.6  HGB 10.5*  HCT 34.1*  MCV 81.2  PLT 286    CBG: Recent Labs  Lab 12/17/20 1142 12/17/20 1606 12/17/20 2007 12/18/20 0025 12/18/20 0432  GLUCAP 153* 104* 104* 98 127*     Studies: No results found.  Scheduled Meds: . acetaminophen  650 mg Oral Q4H   Or  . acetaminophen (TYLENOL) oral liquid 160 mg/5 mL  650 mg Per Tube Q4H   Or  . acetaminophen  650 mg Rectal Q4H  . amLODipine  10 mg Per Tube Daily  . atorvastatin  40 mg Per Tube Daily  . baclofen  10 mg Per Tube 3 times per day  . chlorhexidine gluconate (MEDLINE KIT)  15 mL Mouth Rinse BID  . Chlorhexidine Gluconate Cloth  6 each Topical Daily  . diclofenac Sodium  4 g Topical QID  . feeding supplement (PROSource TF)  45 mL Per Tube Daily  . free water  100 mL Per Tube Q4H  .  hydrALAZINE  50 mg Per Tube Q8H  . ibuprofen  400 mg Per Tube Q8H  . insulin aspart  0-9 Units Subcutaneous Q4H  . mouth rinse  15 mL Mouth Rinse 5 X Daily  . metoprolol tartrate  100 mg Per Tube BID  . modafinil  200 mg Per Tube Daily  . pantoprazole sodium  40 mg Per Tube Daily  . tiZANidine  4 mg Per Tube QHS   Continuous Infusions: . sodium chloride Stopped (10/26/20 1445)  . 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    Palliative care.    PCCM continues to follow.  No decannulation.   Procedures:  Tracheostomy  PEG tube  EEG  Echocardiogram  Antibiotics: Vancomycin 2/12-2/15 Maxipime 2/12-2/15 Ancef 2/19-2/27 Vancomycin 2/20 x 1 dose Maxipime 2/27- 3/5 Vancomycin 3/1-3/3 Ancef 3/6-3/14 Cipro 3/20 4-3/30 Unasyn 3/30-4/4 Rocephin 4/11-4/12 Meropenem 4/12-4/17   Time spent: 25 minutes    Erin Hearing ANP  [facility] 7 am - 330 pm/M-F for direct patient care and secure chat Please refer to [place] for contact info 113  Days

## 2020-12-18 NOTE — Progress Notes (Addendum)
Modified Barium Swallow Progress Note  Patient Details  Name: Charlene [name] MRN: [id number] Date of Birth: 1966/05/30  Today's Date: 12/18/2020  Modified Barium Swallow completed.  Full report located under Chart Review in the Imaging Section.  Brief recommendations include the following:  Clinical Impression  Pt participated in first instrumental swallow study today since her admission.  Results were promising.  She demonstrated mild difficulty with oral control/cohesion, with piecemeal bolusing of liquids into the pharynx.  Pharyngeal swallow consistently triggered at the level of the pyriforms, which were large and proved to be a good "holding bay" prior to swallow trigger.  Once swallow was initiated, there was consistent epiglottic closure over the larynx.  There was excellent pharyngeal squeeze, which eliminated concerns for residue between swallows.  There were occasional incidents of trace penetration of liquids over the arytenoids, then hovering on the inferior surface of the vocal folds.  There was one incident of trace aspiration (cup sip of liquids) on posterior trachea- aspiration did elicit a delayed cough response.  Pt used PMV during study to facilitate cough/sub-glottal pressures.   Recommend initiating dysphagia 1/purees with staff and family (upon education). PEG for primary nutrition. Pt should be sitting upright for all puree consumption. SLP will continue therapeutic trials of nectars/thin liquids. Anticipate pt will continue to demonstrate improvements in overall swallow function.  Addendum: Spoke with pt's husband, [name], over phone re: results of MBS and plans to educate family in safest feeding early next week.    Swallow Evaluation Recommendations       SLP Diet Recommendations: Dysphagia 1 (Puree) solids       Medication Administration: Via alternative means       Compensations: Minimize environmental distractions   Postural Changes: Seated upright at 90  degrees   Oral Care Recommendations: Oral care QID      Titan Karner L. Samson [name], MA CCC/SLP Acute Rehabilitation Services Office number [phone] Pager [phone]   [name] [facility] 12/18/2020,3:01 PM

## 2020-12-18 NOTE — Procedures (Signed)
Tracheostomy Change Note  Patient Details:   Name: ORA BOLLIG DOB: 1965/11/07 MRN: [id number]    Airway Documentation:     Evaluation  O2 sats: stable throughout Complications: No apparent complications Patient did tolerate procedure well. Bilateral Breath Sounds: Clear   Pt tolerated tx well. RT X2 exchanged trach easily with positive color change on end tidal.   Joniel Graumann E 12/18/2020, 5:04 PM

## 2020-12-19 LAB — COMPREHENSIVE METABOLIC PANEL
ALT: 35 U/L (ref 0–44)
AST: 27 U/L (ref 15–41)
Albumin: 3.2 g/dL — ABNORMAL LOW (ref 3.5–5.0)
Alkaline Phosphatase: 97 U/L (ref 38–126)
Anion gap: 9 (ref 5–15)
BUN: 11 mg/dL (ref 6–20)
CO2: 29 mmol/L (ref 22–32)
Calcium: 9.4 mg/dL (ref 8.9–10.3)
Chloride: 102 mmol/L (ref 98–111)
Creatinine, Ser: 0.44 mg/dL (ref 0.44–1.00)
GFR, Estimated: >60 mL/min (ref >60)
Glucose, Bld: 110 mg/dL — ABNORMAL HIGH (ref 70–99)
Potassium: 3.9 mmol/L (ref 3.5–5.1)
Sodium: 140 mmol/L (ref 135–145)
Total Bilirubin: 0.5 mg/dL (ref 0.3–1.2)
Total Protein: 7 g/dL (ref 6.5–8.1)

## 2020-12-19 LAB — CBC WITH DIFFERENTIAL/PLATELET
Abs Immature Granulocytes: 0.02 10*3/uL (ref 0.00–0.07)
Basophils Absolute: 0 10*3/uL (ref 0.0–0.1)
Basophils Relative: 0 %
Eosinophils Absolute: 0.1 10*3/uL (ref 0.0–0.5)
Eosinophils Relative: 2 %
HCT: 34.4 % — ABNORMAL LOW (ref 36.0–46.0)
Hemoglobin: 10.9 g/dL — ABNORMAL LOW (ref 12.0–15.0)
Immature Granulocytes: 0 %
Lymphocytes Relative: 43 %
Lymphs Abs: 2.1 10*3/uL (ref 0.7–4.0)
MCH: 25.4 pg — ABNORMAL LOW (ref 26.0–34.0)
MCHC: 31.7 g/dL (ref 30.0–36.0)
MCV: 80.2 fL (ref 80.0–100.0)
Monocytes Absolute: 0.4 10*3/uL (ref 0.1–1.0)
Monocytes Relative: 8 %
Neutro Abs: 2.3 10*3/uL (ref 1.7–7.7)
Neutrophils Relative %: 47 %
Platelets: 281 10*3/uL (ref 150–400)
RBC: 4.29 MIL/uL (ref 3.87–5.11)
RDW: 15.2 % (ref 11.5–15.5)
WBC: 5 10*3/uL (ref 4.0–10.5)
nRBC: 0 % (ref 0.0–0.2)

## 2020-12-19 LAB — GLUCOSE, CAPILLARY
Glucose-Capillary: 90 mg/dL (ref 70–99)
Glucose-Capillary: 136 mg/dL — ABNORMAL HIGH (ref 70–99)
Glucose-Capillary: 109 mg/dL — ABNORMAL HIGH (ref 70–99)
Glucose-Capillary: 142 mg/dL — ABNORMAL HIGH (ref 70–99)
Glucose-Capillary: 147 mg/dL — ABNORMAL HIGH (ref 70–99)
Glucose-Capillary: 138 mg/dL — ABNORMAL HIGH (ref 70–99)

## 2020-12-19 LAB — URINE CULTURE: Culture: 50000 — AB

## 2020-12-19 MED ORDER — DOXAZOSIN MESYLATE 2 MG PO TABS
2.0000 mg | ORAL_TABLET | Freq: Every day | ORAL | Status: DC
  Filled 2020-12-19 (×2): qty 1

## 2020-12-19 MED ORDER — DOXAZOSIN MESYLATE 2 MG PO TABS
2.0000 mg | ORAL_TABLET | Freq: Every day | ORAL | Status: DC
  Administered 2020-12-20 – 2021-04-16 (×119): 2 mg
  Filled 2020-12-19 (×119): qty 1

## 2020-12-19 MED ORDER — SODIUM CHLORIDE 0.9 % IV SOLN
1.0000 g | INTRAVENOUS | Status: AC
  Administered 2020-12-19 – 2020-12-25 (×7): 1 g via INTRAVENOUS
  Filled 2020-12-19 (×7): qty 10

## 2020-12-19 NOTE — Progress Notes (Signed)
[facility] PROGRESS NOTE  Charlene [name] KGU:[id number] DOB: 1966-07-07 DOA: 08/26/2020 PCP: Patient, [name] (Inactive)   HPI/brief narrative: 55 yo F with 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  Subsequently on 2/10, she developed obtundation and respiratory arrest leading to intubation.  She has remained persistently obtunded, received trach and a PEG.  Hospital course complicated by persistent fever, Serratia pneumonia and now pre-orbital cellulitis.  PCCM following.  Subjective: Today, patient was seen and examined at bedside.  Appears to be alert and grimacing.  Objective: Vitals:   12/19/20 0809 12/19/20 0910  BP: (!) 163/97   Pulse: 92   Resp: 17   Temp: 98.2 F (36.8 C)   SpO2: 99% 97%    Intake/Output Summary (Last 24 hours) at 12/19/2020 0915 Last data filed at 12/19/2020 0511 Gross per 24 hour  Intake 160 ml  Output 1200 ml  Net -1040 ml   Filed Weights   12/14/20 0400 12/15/20 0400 12/17/20 0500  Weight: 56.6 kg 54.9 kg 55.2 kg    Physical exam: General:  Average built, not in obvious distress,, alert awake and tracks with her eyes. HENT:   No scleral pallor or icterus noted. Oral mucosa is moist.  Cuffless tracheostomy collar Chest:  Clear breath sounds.  Diminished breath sounds bilaterally. No crackles or wheezes.  CVS: S1 &S2 heard. No murmur.  Regular rate and rhythm. Abdomen: Soft, nontender, nondistended.  Bowel sounds are heard.  Tube in place. Extremities: No cyanosis, clubbing or edema.  Peripheral pulses are palpable. Psych: Alert, awake  CNS: Left facial droop spasticity and apraxia of upper extremity.  Tracks people. Skin: Warm and dry.  No rashes noted.   Assessment/Plan:  Multiple acute strokes  Right Thalamus ICH with IVH  Obstructive Hydrocephalus/Acute on persistent metabolic Encephalopathy/brain injury related hypertonicity  Continue  baclofen and tizanidine. continue PT OT speech-language therapy.  Currently skilled nursing level of care.  Colonic ileus.  On MiraLAX.  Free water flushes.  No nausea vomiting  History of urinary retention fever/Enterococcal UTI, Klebsiella UTI Improved.  Completed antibiotic.  Patient was started on tamsulosin but this cannot be given through the PEG tube.  Will change to doxazosin 2 mg.  Hypertensive emergency/Malignant HTN Patient was supposed to be on Coreg and HCTZ prior to admission but was not taking it.  Continue Norvasc Lopressor and hydralazine.  Acute hypoxemic respiratory failure secondary to CVA requiring mechanical ventilation/Trach dependent -Downsized to #4 cuffless trach on 5/8.  Continue as per trach team. Continue capping trials.  Bilateral knee osteoarthritis.  Continue supportive care.  Dysphagia -Continue tube feedings, Prosource liquid, free water.  Continue sliding scale insulin Accu-Cheks.  Patient underwent modified barium swallow on 12/18/2020 with recommendation for dysphagia 1 solids.  Right upper extremity thrombophlebitis - [name] -> age indeterminate SVT of right cephalic vein.    Neck Contracture/torticollis -- Resolved  Bacterial Conjunctivitis /periorbital cellulitis -Resolved  Chronic pansinusitis/recurrent fevers 2/2 aspiration PNA;History of Serratia Pneumonia  Hospital Acquired Pneumonia:  -Completed several courses of antibiotics -Follow-up imaging including sinus CT consistent with chronic but stable and resolving sinusitis.  Currently on meropenem.   Anemia -Stable  Hyperlipidemia -Continue Lipitor   Status: Inpatient Remains inpatient appropriate because:Altered mental status, Unsafe d/c plan, IV treatments appropriate due to intensity of illness or inability to take PO and Inpatient level of care appropriate due to severity of illness   Dispo: The patient is from: Home  Anticipated d/c is to: SNF trach capable-family  [place] available              Patient currently is medically stable to d/c.   Difficult to place patient Yes  Code Status: Full code  Family Communication: None  DVT prophylaxis: Off Lovenox  Data Reviewed: Basic Metabolic Panel: Recent Labs  Lab 12/14/20 0522 12/18/20 1014 12/19/20 0325  NA 140 139 140  K 3.9 3.7 3.9  CL 101 101 102  CO2 '31 31 29  ' GLUCOSE 124* 106* 110*  BUN '13 16 11  ' CREATININE 0.53 0.49 0.44  CALCIUM 9.4 9.5 9.4  MG  --  1.9  --    Liver Function Tests: Recent Labs  Lab 12/14/20 0522 12/19/20 0325  AST 24 27  ALT 37 35  ALKPHOS 100 97  BILITOT 0.5 0.5  PROT 6.6 7.0  ALBUMIN 3.1* 3.2*   CBC: Recent Labs  Lab 12/14/20 0522 12/18/20 1014 12/19/20 0325  WBC 4.6 5.5 5.0  NEUTROABS  --  2.8 2.3  HGB 10.5* 11.0* 10.9*  HCT 34.1* 35.0* 34.4*  MCV 81.2 80.5 80.2  PLT 286 315 281    CBG: Recent Labs  Lab 12/18/20 1600 12/18/20 2003 12/18/20 2309 12/19/20 0307 12/19/20 0812  GLUCAP 116* 112* 114* 90 136*     Studies: DG Abd 1 View  Result Date: 12/18/2020 CLINICAL DATA:  55 year old female with distended abdomen, constipation. EXAM: ABDOMEN - 1 VIEW COMPARISON:  None. FINDINGS: Portable AP supine views at 0838 hours. Gastrostomy tube in the mid upper abdomen. Diffuse gas-filled large bowel loops throughout the abdomen and pelvis. Cecum and transverse colon most distended, 7-8 cm. No dilated small bowel. No definite pneumoperitoneum on these supine views. Mild scoliosis. Chronic hip joint degeneration. No acute osseous abnormality identified. IMPRESSION: Diffuse gas-filled large bowel loops throughout the abdomen and pelvis, most compatible with colonic ileus. Electronically Signed   By: Genevie Ann M.D.   On: 12/18/2020 09:03   DG Swallowing Func-Speech Pathology  Result Date: 12/18/2020 Objective Swallowing Evaluation: Type of Study: MBS-Modified Barium Swallow Study  Patient Details Name: Charlene [name]  MRN: [id number] Date of Birth: 11-18-1965 Today's Date: 12/18/2020 Time: SLP Start Time (ACUTE ONLY): 1340 -SLP Stop Time (ACUTE ONLY): 1425 SLP Time Calculation (min) (ACUTE ONLY): 45 min Past Medical History: Past Medical History: Diagnosis Date . Hypertension  Past Surgical History: Past Surgical History: Procedure Laterality Date . CESAREAN SECTION   . ESOPHAGOGASTRODUODENOSCOPY N/A 09/10/2020  Procedure: ESOPHAGOGASTRODUODENOSCOPY (EGD);  Surgeon: Kieth Brightly, Arta Bruce, MD;  Location: [place];  Service: General;  Laterality: N/A; . PEG PLACEMENT N/A 09/10/2020  Procedure: PERCUTANEOUS ENDOSCOPIC GASTROSTOMY (PEG) PLACEMENT;  Surgeon: Kieth Brightly Arta Bruce, MD;  Location: [place];  Service: General;  Laterality: N/A; HPI: Pt is 55 y/o female presents [facility] on 2/9 with L sided weakness and headaches. PMH includes anxiety, HTN. 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 S/p Right frontal ventricular catheter placement on 2/10, stopped functioning on 2/11.  MRI 2/11 shows right greater than left mesial temporal lobes / parahippocampal cortex, bilateral basal ganglia, and possibly the upper pons that are concerning for acute infarcts, most likely secondary to mass effect from the hemorrhage. ETT 2/10; trach/PEG 09/10/20.  Trach changed to #6 cuffless 3/11; #4 cuffless 5/4. Hospital course complicated by persistent fever,  Serratia  pneumonia and pre-orbital cellulitis.  Subjective: alert Assessment / Plan / Recommendation CHL IP CLINICAL IMPRESSIONS 12/18/2020 Clinical Impression Pt participated in first instrumental swallow study today since her admission.  Results were promising.  She demonstrated mild difficulty with oral control/cohesion, with piecemeal bolusing of liquids into the pharynx.  Pharyngeal swallow consistently triggered at the level of the pyriforms, which  were large and proved to be a good "holding bay" prior to swallow trigger.  Once swallow was initiated, there was consistent epiglottic closure over the larynx.  There was excellent pharyngeal squeeze, which eliminated concerns for residue between swallows.  There were occasional incidents of trace penetration of liquids over the arytenoids, then hovering on the inferior surface of the vocal folds.  There was one incident of trace aspiration (cup sip of liquids) on posterior trachea- aspiration did elicit a delayed cough response.  Pt used PMV during study to facilitate cough/sub-glottal pressures. Recommend initiating dysphagia 1/purees with staff and family (upon education). Pt should be sitting upright for all puree consumption. SLP will continue therapeutic trials of nectars/thin liquids. Anticipate pt will continue to demonstrate improvements in overall swallow function. SLP Visit Diagnosis Dysphagia, oropharyngeal phase (R13.12) Attention and concentration deficit following -- Frontal lobe and executive function deficit following -- Impact on safety and function Mild aspiration risk   CHL IP TREATMENT RECOMMENDATION 12/18/2020 Treatment Recommendations Therapy as outlined in treatment plan below   Prognosis 12/18/2020 Prognosis for Safe Diet Advancement Good Barriers to Reach Goals -- Barriers/Prognosis Comment -- CHL IP DIET RECOMMENDATION 12/18/2020 SLP Diet Recommendations Dysphagia 1 (Puree) solids Liquid Administration via -- Medication Administration Via alternative means Compensations Minimize environmental distractions Postural Changes Seated upright at 90 degrees   CHL IP OTHER RECOMMENDATIONS 12/18/2020 Recommended Consults -- Oral Care Recommendations Oral care QID Other Recommendations --   CHL IP FOLLOW UP RECOMMENDATIONS 12/18/2020 Follow up Recommendations Skilled Nursing facility   [facility] IP FREQUENCY AND DURATION 12/18/2020 Speech Therapy Frequency (ACUTE ONLY) min 3x week Treatment Duration 4 weeks      CHL  IP ORAL PHASE 12/18/2020 Oral Phase Impaired Oral - Pudding Teaspoon -- Oral - Pudding Cup -- Oral - Honey Teaspoon -- Oral - Honey Cup -- Oral - Nectar Teaspoon Weak lingual manipulation;Piecemeal swallowing;Delayed oral transit Oral - Nectar Cup -- Oral - Nectar Straw -- Oral - Thin Teaspoon Weak lingual manipulation;Piecemeal swallowing;Decreased bolus cohesion Oral - Thin Cup Weak lingual manipulation;Piecemeal swallowing;Decreased bolus cohesion Oral - Thin Straw Weak lingual manipulation;Piecemeal swallowing;Decreased bolus cohesion Oral - Puree Weak lingual manipulation Oral - Mech Soft -- Oral - Regular -- Oral - Multi-Consistency -- Oral - Pill -- Oral Phase - Comment --  CHL IP PHARYNGEAL PHASE 12/18/2020 Pharyngeal Phase Impaired Pharyngeal- Pudding Teaspoon -- Pharyngeal -- Pharyngeal- Pudding Cup -- Pharyngeal -- Pharyngeal- Honey Teaspoon -- Pharyngeal -- Pharyngeal- Honey Cup -- Pharyngeal -- Pharyngeal- Nectar Teaspoon Delayed swallow initiation-pyriform sinuses;Penetration/Aspiration before swallow;Penetration/Apiration after swallow;Trace aspiration Pharyngeal Material enters airway, CONTACTS cords and not ejected out Pharyngeal- Nectar Cup -- Pharyngeal -- Pharyngeal- Nectar Straw -- Pharyngeal -- Pharyngeal- Thin Teaspoon Delayed swallow initiation-pyriform sinuses Pharyngeal Material does not enter airway Pharyngeal- Thin Cup Delayed swallow initiation-pyriform sinuses;Trace aspiration;Penetration/Aspiration before swallow;Penetration/Apiration after swallow Pharyngeal Material enters airway, passes BELOW cords without attempt by patient to eject out (silent aspiration) Pharyngeal- Thin Straw Other (Comment) Pharyngeal -- Pharyngeal- Puree Delayed swallow initiation-pyriform sinuses Pharyngeal -- Pharyngeal- Mechanical Soft -- Pharyngeal -- Pharyngeal- Regular -- Pharyngeal -- Pharyngeal- Multi-consistency -- Pharyngeal -- Pharyngeal- Pill -- Pharyngeal --  Pharyngeal Comment --  No flowsheet data  found. Juan Quam [name] 12/18/2020, 3:04 PM               Scheduled Meds: . amLODipine  10 mg Per Tube Daily  . atorvastatin  40 mg Per Tube Daily  . baclofen  10 mg Per Tube 3 times per day  . chlorhexidine gluconate (MEDLINE KIT)  15 mL Mouth Rinse BID  . Chlorhexidine Gluconate Cloth  6 each Topical Daily  . diclofenac Sodium  4 g Topical QID  . feeding supplement (PROSource TF)  45 mL Per Tube Daily  . free water  200 mL Per Tube Q4H  . hydrALAZINE  50 mg Per Tube Q8H  . insulin aspart  0-9 Units Subcutaneous Q4H  . mouth rinse  15 mL Mouth Rinse 5 X Daily  . metoprolol tartrate  100 mg Per Tube BID  . modafinil  200 mg Per Tube Daily  . pantoprazole sodium  40 mg Per Tube Daily  . polyethylene glycol  17 g Per Tube Daily  . predniSONE  20 mg Per Tube Q breakfast  . tamsulosin  0.4 mg Oral Daily  . tiZANidine  4 mg Per Tube QHS   Continuous Infusions: . sodium chloride Stopped (10/26/20 1445)  . feeding supplement (OSMOLITE 1.2 CAL) 55 mL/hr at 12/17/20 2000  . 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   Palliative care.    PCCM continues to follow.  No decannulation.   Procedures:  Tracheostomy  PEG tube  EEG  Echocardiogram  Antibiotics: Vancomycin 2/12-2/15 Maxipime 2/12-2/15 Ancef 2/19-2/27 Vancomycin 2/20 x 1 dose Maxipime 2/27- 3/5 Vancomycin 3/1-3/3 Ancef 3/6-3/14 Cipro 3/20 4-3/30 Unasyn 3/30-4/4 Rocephin 4/11-4/12 Meropenem 4/12-4/17  Micro Data:  2/9  COVID + Influenza negative 2/10 MRSA PCR negative 2/12 BCx 2 > no growth  2/19 BC 1/2 +Stap EPI, 1/2 no growth  10/07/2020 sputum positive for Serratia marcescens 4/8 Enterobacter cloaca + urine culture MDR sensitive only to imipenem and gentamicin   Antimicrobials:  Cefepime 2/12 > 2/15 Vanc 2/12 > 2/15 10/08/2020 ciprofloxacin>>3/30 Unasyn 3/30 >> 4/02 Rocephin 4/11 > 4/12 Meropenem 4/12>4/17   Flora Lipps, MD [facility] 114  Days

## 2020-12-20 LAB — GLUCOSE, CAPILLARY
Glucose-Capillary: 139 mg/dL — ABNORMAL HIGH (ref 70–99)
Glucose-Capillary: 131 mg/dL — ABNORMAL HIGH (ref 70–99)
Glucose-Capillary: 131 mg/dL — ABNORMAL HIGH (ref 70–99)
Glucose-Capillary: 149 mg/dL — ABNORMAL HIGH (ref 70–99)
Glucose-Capillary: 128 mg/dL — ABNORMAL HIGH (ref 70–99)

## 2020-12-20 NOTE — Progress Notes (Signed)
[facility] PROGRESS NOTE  Charlene [name] YBW:[id number] DOB: Feb 16, 1966 DOA: 08/26/2020 PCP: Patient, [name] (Inactive)   HPI/brief narrative: 55 yo F with 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  Subsequently on 2/10, she developed obtundation and respiratory arrest leading to intubation.  She has remained persistently obtunded, received trach and a PEG.  Hospital course complicated by persistent fever, Serratia pneumonia and now pre-orbital cellulitis.  PCCM following.  Subjective: Today, patient was seen and examined at bedside.  Appears to be alert and grimacing.  Objective: Vitals:   12/20/20 0911 12/20/20 1140  BP: (!) 152/76   Pulse: 95 88  Resp: 19 20  Temp:    SpO2: 98% 97%    Intake/Output Summary (Last 24 hours) at 12/20/2020 1346 Last data filed at 12/20/2020 7681 Gross per 24 hour  Intake 6635.17 ml  Output 700 ml  Net 5935.17 ml   Filed Weights   12/14/20 0400 12/15/20 0400 12/17/20 0500  Weight: 56.6 kg 54.9 kg 55.2 kg    Physical exam: General:  Average built, not in obvious distress,, alert awake and tracks with her eyes. HENT:   No scleral pallor or icterus noted. Oral mucosa is moist.  Cuffless tracheostomy collar Chest:  Clear breath sounds.  Diminished breath sounds bilaterally. No crackles or wheezes.  CVS: S1 &S2 heard. No murmur.  Regular rate and rhythm. Abdomen: Soft, nontender, nondistended.  Bowel sounds are heard.  Tube in place. Extremities: No cyanosis, clubbing or edema.  Peripheral pulses are palpable. Psych: Alert, awake  CNS: Left facial droop spasticity and apraxia of upper extremity.  Tracks people.  Squeezed hands Skin: Warm and dry.  No rashes noted.   Assessment/Plan:  Multiple acute strokes  Right Thalamus ICH with IVH  Obstructive Hydrocephalus/Acute on persistent metabolic Encephalopathy/brain injury related hypertonicity  Continue  baclofen and tizanidine. continue PT OT speech-language therapy.  Currently, considering skilled nursing level of care.  Colonic ileus.  On MiraLAX.  Free water flushes.  Stable  History of urinary retention fever/Enterococcal UTI, Klebsiella UTI Improved.  Patient was started on tamsulosin but this cannot be given through the PEG tube.  Will change to doxazosin 2 mg.  Was on meropenem.  Has been switched to Rocephin.  Hypertensive emergency/Malignant HTN Patient was supposed to be on Coreg and HCTZ prior to admission but was not taking it.  Continue Norvasc Lopressor and hydralazine.  Doxazosin has been added since yesterday  Acute hypoxemic respiratory failure secondary to CVA requiring mechanical ventilation/Trach dependent -Downsized to #4 cuffless trach on 5/8.  Continue as per trach team. Continue capping trials.  Bilateral knee osteoarthritis.  Continue supportive care.  Dysphagia -Continue tube feedings, Prosource liquid, free water.  Continue sliding scale insulin Accu-Cheks.  Patient underwent modified barium swallow on 12/18/2020 with recommendation for dysphagia 1 solids.  Blood glucose level of 131.  Right upper extremity thrombophlebitis Symptomatic care.  Neck Contracture/torticollis Continue antispasmodic.  Improved  Bacterial Conjunctivitis /periorbital cellulitis -Resolved  Chronic pansinusitis/recurrent fevers 2/2 aspiration PNA;History of Serratia Pneumonia  Hospital Acquired Pneumonia:  -Completed several courses of antibiotics. Follow-up imaging including sinus CT consistent with chronic but stable and resolving sinusitis.  Has been switched to Rocephin.   Anemia -Stable   Hyperlipidemia -Continue Lipitor   Status: Inpatient Remains inpatient appropriate because:Altered mental status, Unsafe d/c plan, IV treatments appropriate due to intensity of illness or inability to take PO and Inpatient level of care appropriate due to  severity of illness   Dispo:  The patient is from: Home              Anticipated d/c is to: SNF trach capable-[place] but no beds available              Patient currently is medically stable to d/c.   Difficult to place patient Yes  Code Status: Full code  Family Communication: None  DVT prophylaxis: Off Lovenox  Data Reviewed: Basic Metabolic Panel: Recent Labs  Lab 12/14/20 0522 12/18/20 1014 12/19/20 0325  NA 140 139 140  K 3.9 3.7 3.9  CL 101 101 102  CO2 _0 GLUCOSE 124* 106* 110*  BUN _1 CREATININE 0.53 0.49 0.44  CALCIUM 9.4 9.5 9.4  MG  --  1.9  --    Liver Function Tests: Recent Labs  Lab 12/14/20 0522 12/19/20 0325  AST 24 27  ALT 37 35  ALKPHOS 100 97  BILITOT 0.5 0.5  PROT 6.6 7.0  ALBUMIN 3.1* 3.2*   CBC: Recent Labs  Lab 12/14/20 0522 12/18/20 1014 12/19/20 0325  WBC 4.6 5.5 5.0  NEUTROABS  --  2.8 2.3  HGB 10.5* 11.0* 10.9*  HCT 34.1* 35.0* 34.4*  MCV 81.2 80.5 80.2  PLT 286 315 281    CBG: Recent Labs  Lab 12/19/20 1953 12/19/20 2314 12/20/20 0313 12/20/20 0721 12/20/20 1238  GLUCAP 147* 138* 139* 131* 131*     Studies: DG Swallowing Func-Speech Pathology  Result Date: 12/18/2020 Objective Swallowing Evaluation: Type of Study: MBS-Modified Barium Swallow Study  Patient Details Name: Charlene [name] MRN: [id number] Date of Birth: 1965/08/19 Today's Date: 12/18/2020 Time: SLP Start Time (ACUTE ONLY): 1340 -SLP Stop Time (ACUTE ONLY): 1425 SLP Time Calculation (min) (ACUTE ONLY): 45 min Past Medical History: Past Medical History: Diagnosis Date . Hypertension  Past Surgical History: Past Surgical History: Procedure Laterality Date . CESAREAN SECTION   . ESOPHAGOGASTRODUODENOSCOPY N/A 09/10/2020  Procedure: ESOPHAGOGASTRODUODENOSCOPY (EGD);  Surgeon: Kieth Brightly, Arta Bruce, MD;  Location: [place];  Service: General;  Laterality: N/A; . PEG PLACEMENT N/A 09/10/2020  Procedure: PERCUTANEOUS ENDOSCOPIC GASTROSTOMY (PEG)  PLACEMENT;  Surgeon: Kieth Brightly Arta Bruce, MD;  Location: [place];  Service: General;  Laterality: N/A; HPI: Pt is 55 y/o female presents to [facility] on 2/9 with L sided weakness and headaches. PMH includes anxiety, HTN. 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 S/p Right frontal ventricular catheter placement on 2/10, stopped functioning on 2/11.  MRI 2/11 shows right greater than left mesial temporal lobes / parahippocampal cortex, bilateral basal ganglia, and possibly the upper pons that are concerning for acute infarcts, most likely secondary to mass effect from the hemorrhage. ETT 2/10; trach/PEG 09/10/20.  Trach changed to #6 cuffless 3/11; #4 cuffless 5/4. Hospital course complicated by persistent fever,  Serratia pneumonia and pre-orbital cellulitis.  Subjective: alert Assessment / Plan / Recommendation CHL IP CLINICAL IMPRESSIONS 12/18/2020 Clinical Impression Pt participated in first instrumental swallow study today since her admission.  Results were promising.  She demonstrated mild difficulty with oral control/cohesion, with piecemeal bolusing of liquids into the pharynx.  Pharyngeal swallow consistently triggered at the level of the pyriforms, which were large and proved to be a good "holding bay" prior to swallow trigger.  Once swallow was initiated, there was consistent epiglottic closure over the  larynx.  There was excellent pharyngeal squeeze, which eliminated concerns for residue between swallows.  There were occasional incidents of trace penetration of liquids over the arytenoids, then hovering on the inferior surface of the vocal folds.  There was one incident of trace aspiration (cup sip of liquids) on posterior trachea- aspiration did elicit a delayed cough response.  Pt used PMV during study to facilitate cough/sub-glottal pressures. Recommend initiating  dysphagia 1/purees with staff and family (upon education). Pt should be sitting upright for all puree consumption. SLP will continue therapeutic trials of nectars/thin liquids. Anticipate pt will continue to demonstrate improvements in overall swallow function. SLP Visit Diagnosis Dysphagia, oropharyngeal phase (R13.12) Attention and concentration deficit following -- Frontal lobe and executive function deficit following -- Impact on safety and function Mild aspiration risk   CHL IP TREATMENT RECOMMENDATION 12/18/2020 Treatment Recommendations Therapy as outlined in treatment plan below   Prognosis 12/18/2020 Prognosis for Safe Diet Advancement Good Barriers to Reach Goals -- Barriers/Prognosis Comment -- CHL IP DIET RECOMMENDATION 12/18/2020 SLP Diet Recommendations Dysphagia 1 (Puree) solids Liquid Administration via -- Medication Administration Via alternative means Compensations Minimize environmental distractions Postural Changes Seated upright at 90 degrees   CHL IP OTHER RECOMMENDATIONS 12/18/2020 Recommended Consults -- Oral Care Recommendations Oral care QID Other Recommendations --   CHL IP FOLLOW UP RECOMMENDATIONS 12/18/2020 Follow up Recommendations Skilled Nursing facility   [facility] IP FREQUENCY AND DURATION 12/18/2020 Speech Therapy Frequency (ACUTE ONLY) min 3x week Treatment Duration 4 weeks      CHL IP ORAL PHASE 12/18/2020 Oral Phase Impaired Oral - Pudding Teaspoon -- Oral - Pudding Cup -- Oral - Honey Teaspoon -- Oral - Honey Cup -- Oral - Nectar Teaspoon Weak lingual manipulation;Piecemeal swallowing;Delayed oral transit Oral - Nectar Cup -- Oral - Nectar Straw -- Oral - Thin Teaspoon Weak lingual manipulation;Piecemeal swallowing;Decreased bolus cohesion Oral - Thin Cup Weak lingual manipulation;Piecemeal swallowing;Decreased bolus cohesion Oral - Thin Straw Weak lingual manipulation;Piecemeal swallowing;Decreased bolus cohesion Oral - Puree Weak lingual manipulation Oral - Mech Soft -- Oral - Regular -- Oral -  Multi-Consistency -- Oral - Pill -- Oral Phase - Comment --  CHL IP PHARYNGEAL PHASE 12/18/2020 Pharyngeal Phase Impaired Pharyngeal- Pudding Teaspoon -- Pharyngeal -- Pharyngeal- Pudding Cup -- Pharyngeal -- Pharyngeal- Honey Teaspoon -- Pharyngeal -- Pharyngeal- Honey Cup -- Pharyngeal -- Pharyngeal- Nectar Teaspoon Delayed swallow initiation-pyriform sinuses;Penetration/Aspiration before swallow;Penetration/Apiration after swallow;Trace aspiration Pharyngeal Material enters airway, CONTACTS cords and not ejected out Pharyngeal- Nectar Cup -- Pharyngeal -- Pharyngeal- Nectar Straw -- Pharyngeal -- Pharyngeal- Thin Teaspoon Delayed swallow initiation-pyriform sinuses Pharyngeal Material does not enter airway Pharyngeal- Thin Cup Delayed swallow initiation-pyriform sinuses;Trace aspiration;Penetration/Aspiration before swallow;Penetration/Apiration after swallow Pharyngeal Material enters airway, passes BELOW cords without attempt by patient to eject out (silent aspiration) Pharyngeal- Thin Straw Other (Comment) Pharyngeal -- Pharyngeal- Puree Delayed swallow initiation-pyriform sinuses Pharyngeal -- Pharyngeal- Mechanical Soft -- Pharyngeal -- Pharyngeal- Regular -- Pharyngeal -- Pharyngeal- Multi-consistency -- Pharyngeal -- Pharyngeal- Pill -- Pharyngeal -- Pharyngeal Comment --  No flowsheet data found. Juan Quam [name] 12/18/2020, 3:04 PM               Scheduled Meds: . amLODipine  10 mg Per Tube Daily  . atorvastatin  40 mg Per Tube Daily  . baclofen  10 mg Per Tube 3 times per day  . chlorhexidine gluconate (MEDLINE KIT)  15 mL Mouth Rinse BID  . Chlorhexidine Gluconate Cloth  6 each Topical Daily  . diclofenac Sodium  4 g  Topical QID  . doxazosin  2 mg Per Tube Daily  . feeding supplement (PROSource TF)  45 mL Per Tube Daily  . free water  200 mL Per Tube Q4H  . hydrALAZINE  50 mg Per Tube Q8H  . insulin aspart  0-9 Units Subcutaneous Q4H  . mouth rinse  15 mL Mouth Rinse 5 X Daily  .  metoprolol tartrate  100 mg Per Tube BID  . modafinil  200 mg Per Tube Daily  . pantoprazole sodium  40 mg Per Tube Daily  . polyethylene glycol  17 g Per Tube Daily  . predniSONE  20 mg Per Tube Q breakfast  . tiZANidine  4 mg Per Tube QHS   Continuous Infusions: . sodium chloride Stopped (10/26/20 1445)  . cefTRIAXone (ROCEPHIN)  IV 1 g (12/20/20 1326)  . 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   Palliative care.    PCCM continues to follow.  No decannulation.   Procedures:  Tracheostomy  PEG tube  EEG  Echocardiogram  Antibiotics: Vancomycin 2/12-2/15 Maxipime 2/12-2/15 Ancef 2/19-2/27 Vancomycin 2/20 x 1 dose Maxipime 2/27- 3/5 Vancomycin 3/1-3/3 Ancef 3/6-3/14 Cipro 3/20 4-3/30 Unasyn 3/30-4/4 Rocephin 4/11-4/12 Meropenem 4/12-4/17  Micro Data:  2/9 COVID + Influenza negative 2/10 MRSA PCR negative 2/12 BCx 2 > no growth  2/19 BC 1/2 +Stap EPI, 1/2 no growth  10/07/2020 sputum positive for Serratia marcescens 4/8 Enterobacter cloaca + urine culture MDR sensitive only to imipenem and gentamicin   Antimicrobials:  Cefepime 2/12 > 2/15 Vanc 2/12 > 2/15 10/08/2020 ciprofloxacin>>3/30 Unasyn 3/30 >> 4/02 Rocephin 4/11 > 4/12 Meropenem 4/12>4/17   Flora Lipps, MD [facility] 115  Days

## 2020-12-21 ENCOUNTER — Inpatient Hospital Stay (HOSPITAL_COMMUNITY): Payer: Medicaid Other

## 2020-12-21 DIAGNOSIS — K567 Ileus, unspecified: Secondary | ICD-10-CM

## 2020-12-21 LAB — GLUCOSE, CAPILLARY
Glucose-Capillary: 114 mg/dL — ABNORMAL HIGH (ref 70–99)
Glucose-Capillary: 119 mg/dL — ABNORMAL HIGH (ref 70–99)
Glucose-Capillary: 143 mg/dL — ABNORMAL HIGH (ref 70–99)
Glucose-Capillary: 145 mg/dL — ABNORMAL HIGH (ref 70–99)
Glucose-Capillary: 156 mg/dL — ABNORMAL HIGH (ref 70–99)
Glucose-Capillary: 188 mg/dL — ABNORMAL HIGH (ref 70–99)

## 2020-12-21 IMAGING — DX DG ABD PORTABLE 1V
1 series · 1 of 1 positions shown · non-contrast
Comparison: [DATE]

CLINICAL DATA: Abdominal tightness.  Gastrostomy catheter present

EXAM:
PORTABLE ABDOMEN - 1 VIEW

[abdomen supine]
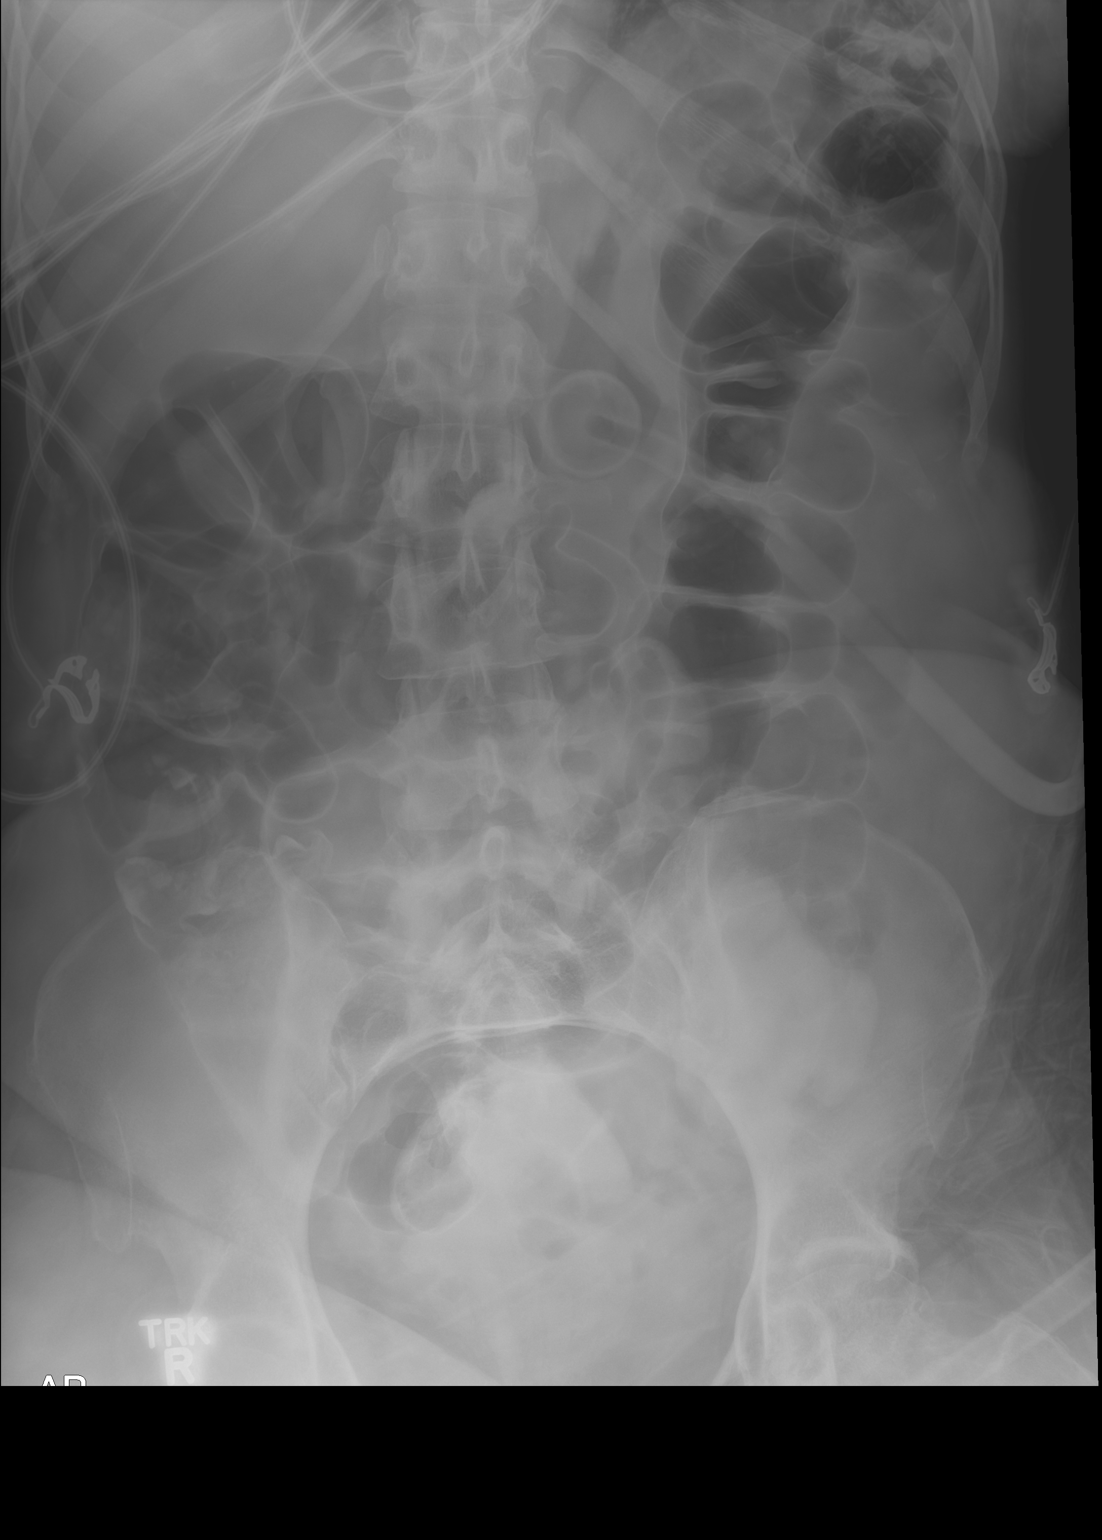

[1 of 1 positions shown; findings below may reference images not displayed]

FINDINGS: Gastrostomy catheter overlies the stomach in the medial left upper
abdomen. There is moderate air in bowel without bowel dilatation. No
air-fluid levels. No evident free air on supine examination. No
abnormal calcifications.
IMPRESSION: Gastrostomy catheter overlies stomach. No findings suggesting bowel
obstruction on supine examination. No free air on supine
examination.

## 2020-12-21 MED ORDER — ACETAMINOPHEN 160 MG/5ML PO SOLN
325.0000 mg | Freq: Three times a day (TID) | ORAL | Status: AC | PRN
  Administered 2020-12-21 – 2020-12-23 (×2): 325 mg
  Filled 2020-12-21 (×2): qty 20.3

## 2020-12-21 NOTE — Progress Notes (Signed)
  Speech Language Pathology Treatment: Dysphagia  Patient Details Name: Charlene [name] MRN: [id number] DOB: January 06, 1966 Today's Date: 12/21/2020 Time: 9030-0923 SLP Time Calculation (min) (ACUTE ONLY): 18 min  Assessment / Plan / Recommendation Clinical Impression  Pt was sleeping soundly and was difficult to rouse for participation. Session focused on education with her husband, Charlene [name], regarding results from Friday's MBS.  Video was accessed from computer in room, and we reviewed swallowing study, areas of progress (good pharyngeal strength, improved oral control) and areas requiring gains (airway protection, strength of cough).  [name] asked good questions.  We tried again to awaken Charlene [name] but she was too sleepy.  SLP will complete education/training with family as able so that they can begin to give Charlene [name] small amounts of purees with supervision.  HPI HPI: Pt is 55 y/o female presents to [facility] on 2/9 with L sided weakness and headaches. PMH includes anxiety, HTN. 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 S/p Right frontal ventricular catheter placement on 2/10, stopped functioning on 2/11.  MRI 2/11 shows right greater than left mesial temporal lobes / parahippocampal cortex, bilateral basal ganglia, and possibly the upper pons that are concerning for acute infarcts, most likely secondary to mass effect from the hemorrhage. ETT 2/10; trach/PEG 09/10/20.  Trach changed to #6 cuffless 3/11; #4 cuffless 5/4. Hospital course complicated by persistent fever,  Serratia pneumonia and pre-orbital cellulitis.      SLP Plan  Continue with current plan of care       Recommendations         Patient may use Passy-Muir Speech Valve: Intermittently with supervision;Caregiver trained to provide supervision;During all therapies with supervision PMSV Supervision: Full          Oral Care Recommendations: Oral care QID Follow up Recommendations: Skilled Nursing facility SLP Visit Diagnosis: Dysphagia, oropharyngeal phase (R13.12) Plan: Continue with current plan of care       GO                Charlene [name] 12/21/2020, 1:57 PM Charlene [name] L. Charlene [name], MA CCC/SLP Acute Rehabilitation Services Office number [phone] Pager [phone]

## 2020-12-21 NOTE — Plan of Care (Signed)
  Problem: Coping: Goal: Will identify appropriate support needs Outcome: Progressing   Problem: Health Behavior/Discharge Planning: Goal: Ability to manage health-related needs will improve Outcome: Progressing   Problem: Self-Care: Goal: Verbalization of feelings and concerns over difficulty with self-care will improve Outcome: Progressing Goal: Ability to communicate needs accurately will improve Outcome: Progressing   Problem: Nutrition: Goal: Risk of aspiration will decrease Outcome: Progressing   Problem: Intracerebral Hemorrhage Tissue Perfusion: Goal: Complications of Intracerebral Hemorrhage will be minimized Outcome: Progressing   Problem: Education: Goal: Knowledge of General Education information will improve Description: Including pain rating scale, medication(s)/side effects and non-pharmacologic comfort measures Outcome: Progressing   Problem: Health Behavior/Discharge Planning: Goal: Ability to manage health-related needs will improve Outcome: Progressing   Problem: Clinical Measurements: Goal: Ability to maintain clinical measurements within normal limits will improve Outcome: Progressing Goal: Will remain free from infection Outcome: Progressing Goal: Diagnostic test results will improve Outcome: Progressing Goal: Respiratory complications will improve Outcome: Progressing Goal: Cardiovascular complication will be avoided Outcome: Progressing   Problem: Coping: Goal: Level of anxiety will decrease Outcome: Progressing   Problem: Elimination: Goal: Will not experience complications related to bowel motility Outcome: Progressing Goal: Will not experience complications related to urinary retention Outcome: Progressing   Problem: Pain Managment: Goal: General experience of comfort will improve Outcome: Progressing   Problem: Skin Integrity: Goal: Risk for impaired skin integrity will decrease Outcome: Progressing   Problem: Intracerebral  Hemorrhage Tissue Perfusion: Goal: Complications of Intracerebral Hemorrhage will be minimized Outcome: Progressing   Problem: Ischemic Stroke/TIA Tissue Perfusion: Goal: Complications of ischemic stroke/TIA will be minimized Outcome: Progressing   

## 2020-12-21 NOTE — Progress Notes (Addendum)
[facility] PROGRESS NOTE  Charlene [name] TIR:[id number] DOB: [birth year] DOA: 08/26/2020 PCP: Patient, [name] (Inactive)    October 08, 2020     10/16/2020                        10/16/2020     10/18/2020                        10/20/2020    Status: Remains inpatient appropriate because:Altered mental status, Unsafe d/c plan, IV treatments appropriate due to intensity of illness or inability to take PO and Inpatient level of care appropriate due to severity of illness   Dispo: The patient is from: Home              Anticipated d/c is to: SNF trach capable[place]-trach team re eval 6/3 and still rec NOT to decannulate 2/2 menattion              Patient currently is not medically stable to d/c.   Difficult to place patient Yes   Level of care: Telemetry Medical  Code Status: Full Family Communication: husband Charlene [name] [phone]) is her POA and legal contact.  [name] was at bedside on 5/18 and given update.  [name] at bedside 6/6 DVT prophylaxis: Chronically bedbound.  5/6: We will discontinue prophylactic Lovenox   Vaccination status: Unvaccinated-husband still undecided.  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  Subsequently on 2/10, she developed obtundation and respiratory arrest leading to intubation.  She has remained persistently obtunded, now has a trach and a PEG.  Hospital course complicated by  persistent fever, Serratia pneumonia and now pre-orbital cellulitis  As of 3/25 with adjustments in hypertonicity medications patient began to improve regarding hypertonicity.  Eventually started on provigil with improvements in alertness.  Evaluated by rehab physician who recommended increasing provigil dose further.  Subsequently patient has become more alert.  Most days she is able to track and with encouragement able to follow simple commands especially since partial obstruction glasses placed.  See progress note dictated 54/27 for expanded details.   2/09 Admit for [place] 2/10 EVD placed, later intubated due to obtundation and respiratory arrest 2/11 MRI Brain, hypertonic saline d/c'd, EVD not working.  Husband did not consent to PICC  2/13 EVD removed 2/17 [place] discussion with family, PMT 2/24 Bedside trach and PEG 3/1 Transferred to [facility]  Subjective: Upon my initial evaluation this morning she was sleeping and I did not awaken her.  Returned later to room to speak with patient's husband.  Patient was awake.  Has been performing PROM with patient patient visibly wincing in pain with right hip and knee movement.  Objective: Vitals:   12/21/20 0416 12/21/20 0736  BP:  (!) (P) 144/88  Pulse: 89 (P) 99  Resp: 13 (P) 19  Temp:  (P) 98.4 F (36.9 C)  SpO2: 98%  Intake/Output Summary (Last 24 hours) at 12/21/2020 0814 Last data filed at 12/21/2020 0412 Gross per 24 hour  Intake 7355 ml  Output 1201 ml  Net 6154 ml   Filed Weights   12/14/20 0400 12/15/20 0400 12/17/20 0500  Weight: 56.6 kg 54.9 kg 55.2 kg    Exam:  Constitutional: Alert, primarily focused on husband at bedside. Respiratory: #4.0 cuffless trach, trach collar with FiO2 21% at 5L,  simple commands.  Does remain clear and she is stable otherwise Cardiovascular: Normal heart sounds, normotensive.  No peripheral edema Abdomen:  PEG full tube feeding-LBM 6/5, nondistended, nontender to palpation.  Normoactive bowel  sounds. Musculoskeletal: Bilateral atrophy of thigh and calf muscles-continues to have issues with hip flexion and knee flexion with pain at both right hip and knee with PROM Neurologic: Alert, spontaneous movement of RUE-intermittantly purposeful. Subtle L facial droop, minimal movement LEs. Therapies noted increased spasticity and apraxia of UEs during session 6/5 Psych: Awake.  Nonverbal.  Assessment/Plan: Acute problems: Multiple acute strokes  Right Thalamus ICH with IVH  Obstructive Hydrocephalus/Acute on persistent metabolic Encephalopathy/brain injury related hypertonicity -Continue baclofen to 60m TID and HS Tizanadine to 4 mg HS for spasticity.  -Continue Provigil -Continue PT/OT/SLP; repeat team documents purposeful actions during therapies including reaching for objects and attempting to feed self with assistance/assistive devices -54/2, CIR MD reevaluated patient " Mrs. SConsolounfortunately has not made any meaningful functional gains over the last month. She remains most appropriate for SNF level care".   Colonic Ileus -6/6 resolved based on follow-up abdominal films -Continue Miralax daily  Bilateral knee osteoarthritis/reported ligamentous injury to right knee -Continue Voltaren gel; -Has completed 7 days of continue scheduled Tylenol and ibuprofen  -Continue short course of prednisone -According to husband patient has a remote MCL tear.  Given recurrent right knee pain will obtain noncontrasted MRI to better characterize  Hypertensive emergency/Malignant HTN -Due to financial constraints patient was not taking carvedilol and hydrochlorothiazide prior to admission -Continue Norvasc, Lopressor and hydralazine. -Also on Flomax for urinary retention   Acute hypoxemic respiratory failure secondary to CVA requiring mechanical ventilation (Resolved) Trach dependent -Downsized to #4 cuffless trach (5/8) -Despite tolerating capping trials trach team documents her baseline  encephalopathy and poor cough mechanics preclude decannulation given extreme risk of airway compromise (death from aspiration).  They state she is not a candidate for decannulation and as of 5/30 they have signed off although recommended to call back if mental status improves **Agree that unless she makes significant headway with her cough response it is doubtful she will ever be decannulated  Dysphagia -Continue tube feedings, Prosource liquid, free water -Follow CBGs and provide SSI -Status post MBSS on 6/3 plan is to begin supervised pured diet allowing family to administer orals after adequate training  History of urinary retention/recurrent Klebsiella UTI -Continue Flomax -Discussed with ID pharmacist.  Continue Rocephin for total of 7 days -Make sure no issues with recurrent urinary retention    Other problems: Right upper extremity thrombophlebitis - [place]-> age indeterminate SVT of right cephalic vein  Neck Contracture/torticollis -- Resolved  ESBL positive Enterobacter UTI -Has completed 5 days of meropenem -Contact isolation during hospitalization -continue Oxybutynin suspected bladder spasms  Klebsiella UTI -Resistant to nitrofurantoin and ampicillin -Meropenem transitioned to Rocephin on 5/22 -To minimize risk of recurrent UTI have asked that pure wick only be used at bedtime on a 10 PM to 8 AM schedule  Recurrent diarrhea -Resolved -Likely combination of recent antibiotics as well as scheduled  laxative -MiraLAX now as needed  Bacterial Conjunctivitis /periorbital cellulitis -Resolved  Chronic pansinusitis/recurrent fevers 2/2 aspiration PNA;History of Serratia Pneumonia  Hospital Acquired Pneumonia:  -Completed several courses of antibiotic -Follow-up imaging including sinus CT consistent with chronic but stable and resolving sinusitis   Anemia -Stable  HLD -Continue Lipitor      Data Reviewed: Basic Metabolic Panel: Recent Labs  Lab  12/18/20 1014 12/19/20 0325  NA 139 140  K 3.7 3.9  CL 101 102  CO2 31 29  GLUCOSE 106* 110*  BUN 16 11  CREATININE 0.49 0.44  CALCIUM 9.5 9.4  MG 1.9  --    Liver Function Tests: Recent Labs  Lab 12/19/20 0325  AST 27  ALT 35  ALKPHOS 97  BILITOT 0.5  PROT 7.0  ALBUMIN 3.2*   CBC: Recent Labs  Lab 12/18/20 1014 12/19/20 0325  WBC 5.5 5.0  NEUTROABS 2.8 2.3  HGB 11.0* 10.9*  HCT 35.0* 34.4*  MCV 80.5 80.2  PLT 315 281    CBG: Recent Labs  Lab 12/20/20 1607 12/20/20 2020 12/21/20 0031 12/21/20 0353 12/21/20 0734  GLUCAP 149* 128* 119* 114* 145*     Studies: No results found.  Scheduled Meds: . amLODipine  10 mg Per Tube Daily  . atorvastatin  40 mg Per Tube Daily  . baclofen  10 mg Per Tube 3 times per day  . chlorhexidine gluconate (MEDLINE KIT)  15 mL Mouth Rinse BID  . Chlorhexidine Gluconate Cloth  6 each Topical Daily  . diclofenac Sodium  4 g Topical QID  . doxazosin  2 mg Per Tube Daily  . feeding supplement (PROSource TF)  45 mL Per Tube Daily  . free water  200 mL Per Tube Q4H  . hydrALAZINE  50 mg Per Tube Q8H  . insulin aspart  0-9 Units Subcutaneous Q4H  . mouth rinse  15 mL Mouth Rinse 5 X Daily  . metoprolol tartrate  100 mg Per Tube BID  . modafinil  200 mg Per Tube Daily  . pantoprazole sodium  40 mg Per Tube Daily  . polyethylene glycol  17 g Per Tube Daily  . predniSONE  20 mg Per Tube Q breakfast  . tiZANidine  4 mg Per Tube QHS   Continuous Infusions: . sodium chloride Stopped (10/26/20 1445)  . cefTRIAXone (ROCEPHIN)  IV Stopped (12/20/20 1356)  . feeding supplement (OSMOLITE 1.2 CAL) 55 mL/hr at 12/20/20 3818    Principal Problem:   ICH (intracerebral hemorrhage) (HCC) Active Problems:   Acute hypoxemic respiratory failure (HCC)   Status post tracheostomy ([place])   Palliative care by specialist   Muscle hypertonicity   Oropharyngeal dysphagia   Poor prognosis   Aspiration pneumonia of right lower lobe ([place])    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   Acute cystitis without hematuria   Consultants:  Neurology   Neurosurgery.   Palliative care.    PCCM continues to follow.  No decannulation.   Procedures:  Tracheostomy  PEG tube  EEG  Echocardiogram  Antibiotics: Vancomycin 2/12-2/15 Maxipime 2/12-2/15 Ancef 2/19-2/27 Vancomycin 2/20 x 1 dose Maxipime 2/27- 3/5 Vancomycin 3/1-3/3 Ancef 3/6-3/14 Cipro 3/20 4-3/30 Unasyn 3/30-4/4 Rocephin 4/11-4/12 Meropenem 4/12-4/17   Time spent: 25 minutes    Erin Hearing ANP  [facility] 7 am - 330 pm/M-F for direct patient care and secure chat Please refer to [place] for contact info 116  Days

## 2020-12-21 NOTE — TOC Progression Note (Addendum)
Transition of Care [facility]) - Progression Note    Patient Details  Name: Charlene [name] MRN: [id number] Date of Birth: [birth year]  Transition of Care [facility]) CM/SW [place], RN Phone Number: 12/21/2020, 9:02 AM  Clinical Narrative:    Case management called and left a message with the patient's husband, [name] to update on patient's clinical assessment by Erin Hearing, NP and nursing.  CM and MSW will continue to support the family as needed for follow up for Medicaid application through financial counseling and possible guardianship with pending court hearing.  12/21/2020 1130 - CM met with the patient's husband at the bedside and husband was updated with information to contact DSS through Randalyn Rhea at [phone].  He was also given the contact information to follow up with the [facility] in [place], [place] regarding disability assistance.  [name] continues to support the patient staying in the hospital until a closer skilled nursing facility is located.  The patient has an available SNF facility offer in [place], [place] at [facility] and the patient's husband is not in agreement for her transfer to the facility.  The patient is currently waiting on guardianship hearing scheduled for January 28, 2021 per Jed Limerick, attorney representing [facility].   Expected Discharge Plan: [place] Barriers to Discharge: Continued Medical Work up  Expected Discharge Plan and Services Expected Discharge Plan: [place] In-house Referral: Clinical Social Work Discharge Planning Services: CM Consult Post Acute Care Choice: [place] arrangements for the past 2 months: Single Family Home                                       Social Determinants of Health (SDOH) Interventions    Readmission Risk Interventions Readmission Risk Prevention Plan 09/29/2020  Transportation Screening  Complete  PCP or Specialist Appt within 3-5 Days Complete  HRI or Home Care Consult Complete  Social Work Consult for [place] Planning/Counseling Complete  Palliative Care Screening Complete  Medication Review Press photographer) Complete  Some recent data might be hidden

## 2020-12-22 DIAGNOSIS — K5900 Constipation, unspecified: Secondary | ICD-10-CM | POA: Diagnosis not present

## 2020-12-22 LAB — GLUCOSE, CAPILLARY
Glucose-Capillary: 106 mg/dL — ABNORMAL HIGH (ref 70–99)
Glucose-Capillary: 109 mg/dL — ABNORMAL HIGH (ref 70–99)
Glucose-Capillary: 125 mg/dL — ABNORMAL HIGH (ref 70–99)
Glucose-Capillary: 138 mg/dL — ABNORMAL HIGH (ref 70–99)
Glucose-Capillary: 138 mg/dL — ABNORMAL HIGH (ref 70–99)
Glucose-Capillary: 139 mg/dL — ABNORMAL HIGH (ref 70–99)
Glucose-Capillary: 141 mg/dL — ABNORMAL HIGH (ref 70–99)

## 2020-12-22 NOTE — Progress Notes (Signed)
[facility] PROGRESS NOTE  LEE-ANN GAL EQA:[id number] DOB: Aug 11, 1965 DOA: 08/26/2020 PCP: Patient, [name] (Inactive)    October 08, 2020     10/16/2020                        10/16/2020     10/18/2020                        10/20/2020    Status: Remains inpatient appropriate because:Altered mental status, Unsafe d/c plan, IV treatments appropriate due to intensity of illness or inability to take PO and Inpatient level of care appropriate due to severity of illness   Dispo: The patient is from: Home              Anticipated d/c is to: SNF trach capable-[place]-trach team re eval 6/3 and still rec NOT to decannulate 2/2 menattion              Patient currently is not medically stable to d/c.   Difficult to place patient Yes   Level of care: Telemetry Medical  Code Status: Full Family Communication: [name] [phone]) is her POA and legal contact.  [name] was at bedside on 5/18 and given update.  [name] at bedside 6/6 DVT prophylaxis: Chronically bedbound.  5/6: We will discontinue prophylactic Lovenox   Vaccination status: Unvaccinated-husband still undecided.  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  Subsequently on 2/10, she developed obtundation and respiratory arrest leading to intubation.  She has remained persistently obtunded, now has a trach and a PEG.  Hospital course complicated by  persistent fever, Serratia pneumonia and now pre-orbital cellulitis  As of 3/25 with adjustments in hypertonicity medications patient began to improve regarding hypertonicity.  Eventually started on provigil with improvements in alertness.  Evaluated by rehab physician who recommended increasing provigil dose further.  Subsequently patient has become more alert.  Most days she is able to track and with encouragement able to follow simple commands especially since partial obstruction glasses placed.  See progress note dictated 4/27 for expanded details.   2/09 Admit for [facility] 2/10 EVD placed, later intubated due to obtundation and respiratory arrest 2/11 MRI Brain, hypertonic saline d/c'd, EVD not working.  Husband did not consent to PICC  2/13 EVD removed 2/17 [place] discussion with family, PMT 2/24 Bedside trach and PEG 3/1 Transferred to [facility]  Subjective: Very alert today and tracking this Probation officer as she spoke to patient.  Unable to get patient to follow commands primarily wanted patient to spontaneously cough.  He was comfortable although hips are flexed as well as knees and she typically has more pain with extension of extremities since mother is at bedside and updated.  Objective: Vitals:   12/22/20 0609 12/22/20 0752  BP: (!) 157/87 (!) 143/77  Pulse:  76  Resp:  16  Temp:    SpO2: 100% 97%  Intake/Output Summary (Last 24 hours) at 12/22/2020 0759 Last data filed at 12/21/2020 1700 Gross per 24 hour  Intake --  Output 800 ml  Net -800 ml   Filed Weights   12/14/20 0400 12/15/20 0400 12/17/20 0500  Weight: 56.6 kg 54.9 kg 55.2 kg    Exam:  Constitutional: Awake, flat affect.  Appears to be comfortable Respiratory: #4.0 cuffless trach, trach collar with FiO2 21% at 5L, lungs are clear to auscultation anteriorly.  No increased work of breathing and no tracheal secretions.  Managing oral secretions without drooling.  O2 sats 99% Cardiovascular: Normal heart sounds, has subtle  dependent edema pedal edema bilaterally, pulses regular and nontachycardic.  Somewhat hypertensive today at times but this may be more related to pain since overall blood pressure well controlled Abdomen:  PEG full tube feeding-LBM 6/5, soft nontender nondistended with normoactive bowel sound Musculoskeletal: Bilateral atrophy of thigh and calf muscles-continues to have issues with hip flexion and knee flexion with pain at both right hip and knee with PROM Neurologic: Alert, spontaneous movement of RUE-intermittantly purposeful. Subtle L facial droop, minimal movement LEs. Therapies noted increased spasticity and apraxia of UEs during session 6/5.  Today patient is making chewing motions when I mentioned administration of pudding Psych: Awake with persistent baseline bland affect but will track with eyes if spoken to.  Fortunately unable to get patient to follow commands but she does have spontaneous purposeful movement of her right upper extremity  Assessment/Plan: Acute problems: Multiple acute strokes  Right Thalamus ICH with IVH  Obstructive Hydrocephalus/Acute on persistent metabolic Encephalopathy/brain injury related hypertonicity -Continue baclofen to 63m TID and HS Tizanadine to 4 mg HS for spasticity.  -Continue Provigil -Continue PT/OT/SLP; repeat team documents purposeful actions during therapies including reaching for objects and attempting to feed self with assistance/assistive devices -5/2, CIR MD reevaluated patient " Mrs. SLavigneunfortunately has not made any meaningful functional gains over the last month. She remains most appropriate for SNF level care".   Colonic Ileus -6/6 resolved based on follow-up abdominal films -Continue Miralax daily  Bilateral knee osteoarthritis/reported ligamentous injury to right knee/right hip pain -Continue Voltaren gel; -Has completed 7 days of continue scheduled Tylenol and ibuprofen  -Continue short course of prednisone -According to  husband patient has a remote MCL tear.  Given recurrent right knee pain -MRI right knee pending -Voltaren gel to right hip as well  Hypertensive emergency/Malignant HTN -Due to financial constraints patient was not taking carvedilol and hydrochlorothiazide prior to admission -Continue Norvasc, Lopressor and hydralazine. -Also on Flomax for urinary retention   Acute hypoxemic respiratory failure secondary to CVA requiring mechanical ventilation (Resolved) Trach dependent -Downsized to #4 cuffless trach (5/8) -Despite tolerating capping trials trach team documents her baseline encephalopathy and poor cough mechanics preclude decannulation given extreme risk of airway compromise (death from aspiration).  They state she is not a candidate for decannulation and as of 5/30 they have signed off although recommended to call back if mental status improves **Agree that unless she makes significant improvements with her cough response it is doubtful she will ever be decannulated  Dysphagia -Continue tube feedings, Prosource liquid, free water -Follow CBGs and provide SSI -Continue supervised pured diet allowing family to administer orals after adequate training  History of urinary retention/recurrent Klebsiella UTI -Continue Cardura -Discussed with ID pharmacist.  Continue Rocephin for total of 7 days     Other problems: Right upper extremity thrombophlebitis - [place]-> age indeterminate SVT of right cephalic vein  Neck Contracture/torticollis -- Resolved  ESBL positive Enterobacter UTI -Has completed 5 days of meropenem -Contact isolation during hospitalization -continue Oxybutynin suspected bladder spasms  Klebsiella UTI -Resistant to nitrofurantoin and ampicillin -Meropenem transitioned to Rocephin on 5/22 -To minimize risk of recurrent UTI have asked that pure wick only be used at bedtime on a 10 PM to 8 AM schedule  Recurrent diarrhea -Resolved -Likely combination of recent  antibiotics as well as scheduled laxative -MiraLAX now as needed  Bacterial Conjunctivitis /periorbital cellulitis -Resolved  Chronic pansinusitis/recurrent fevers 2/2 aspiration PNA;History of Serratia Pneumonia  Hospital Acquired Pneumonia:  -Completed several courses of antibiotic -Follow-up imaging including sinus CT consistent with chronic but stable and resolving sinusitis   Anemia -Stable  HLD -Continue Lipitor      Data Reviewed: Basic Metabolic Panel: Recent Labs  Lab 12/18/20 1014 12/19/20 0325  NA 139 140  K 3.7 3.9  CL 101 102  CO2 31 29  GLUCOSE 106* 110*  BUN 16 11  CREATININE 0.49 0.44  CALCIUM 9.5 9.4  MG 1.9  --    Liver Function Tests: Recent Labs  Lab 12/19/20 0325  AST 27  ALT 35  ALKPHOS 97  BILITOT 0.5  PROT 7.0  ALBUMIN 3.2*   CBC: Recent Labs  Lab 12/18/20 1014 12/19/20 0325  WBC 5.5 5.0  NEUTROABS 2.8 2.3  HGB 11.0* 10.9*  HCT 35.0* 34.4*  MCV 80.5 80.2  PLT 315 281    CBG: Recent Labs  Lab 12/21/20 1131 12/21/20 1607 12/21/20 2024 12/22/20 0029 12/22/20 0345  GLUCAP 188* 156* 143* 125* 109*     Studies: DG Abd Portable 1V  Result Date: 12/21/2020 CLINICAL DATA:  Abdominal tightness.  Gastrostomy catheter present EXAM: PORTABLE ABDOMEN - 1 VIEW COMPARISON:  December 18, 2020 FINDINGS: Gastrostomy catheter overlies the stomach in the medial left upper abdomen. There is moderate air in bowel without bowel dilatation. No air-fluid levels. No evident free air on supine examination. No abnormal calcifications. IMPRESSION: Gastrostomy catheter overlies stomach. No findings suggesting bowel obstruction on supine examination. No free air on supine examination. Electronically Signed   By: Lowella Grip III M.D.   On: 12/21/2020 09:14    Scheduled Meds: . amLODipine  10 mg Per Tube Daily  . atorvastatin  40 mg Per Tube Daily  . baclofen  10 mg Per Tube 3 times per day  . chlorhexidine gluconate (MEDLINE KIT)  15 mL Mouth  Rinse BID  . Chlorhexidine Gluconate Cloth  6 each Topical Daily  . diclofenac Sodium  4 g Topical QID  . doxazosin  2 mg Per Tube Daily  . feeding supplement (PROSource TF)  45 mL Per Tube Daily  . free water  200 mL Per Tube Q4H  . hydrALAZINE  50 mg Per Tube Q8H  . insulin aspart  0-9 Units Subcutaneous Q4H  . mouth rinse  15 mL Mouth Rinse 5 X Daily  . metoprolol tartrate  100 mg Per Tube BID  . modafinil  200 mg Per Tube Daily  . polyethylene glycol  17 g Per Tube Daily  . predniSONE  20 mg Per Tube Q breakfast  . tiZANidine  4 mg Per Tube QHS   Continuous Infusions: . sodium chloride Stopped (10/26/20 1445)  . cefTRIAXone (ROCEPHIN)  IV 1 g (12/21/20 1432)  . feeding supplement (OSMOLITE 1.2 CAL) 55 mL/hr at 12/20/20 5053    Principal Problem:   ICH (intracerebral hemorrhage) (HCC) Active Problems:   Acute hypoxemic respiratory failure (  [place])   Status post tracheostomy [facility])   Palliative care by specialist   Muscle hypertonicity   Oropharyngeal dysphagia   Poor prognosis   Aspiration pneumonia of right lower lobe ([place])   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   Acute cystitis without hematuria   Ileus, unspecified [facility])   Consultants:  Neurology   Neurosurgery.   Palliative care.    PCCM continues to follow.  No decannulation.   Procedures:  Tracheostomy  PEG tube  EEG  Echocardiogram  Antibiotics: Vancomycin 2/12-2/15 Maxipime 2/12-2/15 Ancef 2/19-2/27 Vancomycin 2/20 x 1 dose Maxipime 2/27- 3/5 Vancomycin 3/1-3/3 Ancef 3/6-3/14 Cipro 3/20 4-3/30 Unasyn 3/30-4/4 Rocephin 4/11-4/12 Meropenem 4/12-4/17   Time spent: 25 minutes    Erin Hearing ANP  [facility] 7 am - 330 pm/M-F for direct patient care and secure chat Please refer to [place] for contact info 117  Days

## 2020-12-23 ENCOUNTER — Inpatient Hospital Stay (HOSPITAL_COMMUNITY): Payer: Medicaid Other

## 2020-12-23 LAB — GLUCOSE, CAPILLARY
Glucose-Capillary: 118 mg/dL — ABNORMAL HIGH (ref 70–99)
Glucose-Capillary: 122 mg/dL — ABNORMAL HIGH (ref 70–99)
Glucose-Capillary: 128 mg/dL — ABNORMAL HIGH (ref 70–99)
Glucose-Capillary: 136 mg/dL — ABNORMAL HIGH (ref 70–99)
Glucose-Capillary: 146 mg/dL — ABNORMAL HIGH (ref 70–99)
Glucose-Capillary: 97 mg/dL (ref 70–99)

## 2020-12-23 IMAGING — CT CT KNEE*R* W/O CM
2 series · 13 of 27 positions shown, 16 images · non-contrast
Comparison: None.

CLINICAL DATA: acute on chronic pain-h/o remote MCL tear

EXAM:
CT OF THE right KNEE WITHOUT CONTRAST
TECHNIQUE: Multidetector CT imaging of the right knee was performed according
to the standard protocol. Multiplanar CT image reconstructions were
also generated.

[Series 4: lfov ext 3.0 b40s · axial · 0.51mm/px · z∈[+502,+666]mm · 8 of 65 slices shown, 10 images]
[im 5/65  soft-tissue]
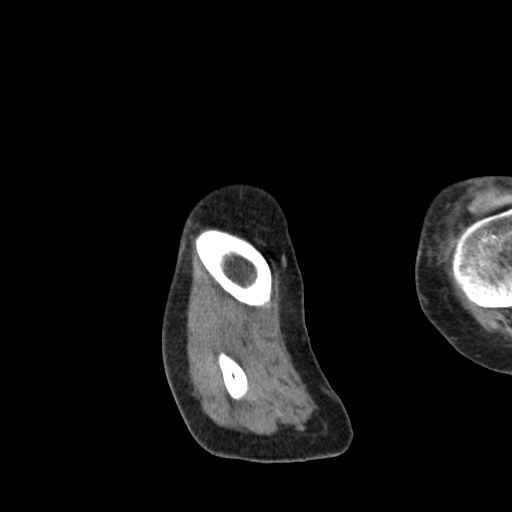
[im 5/65  bone]
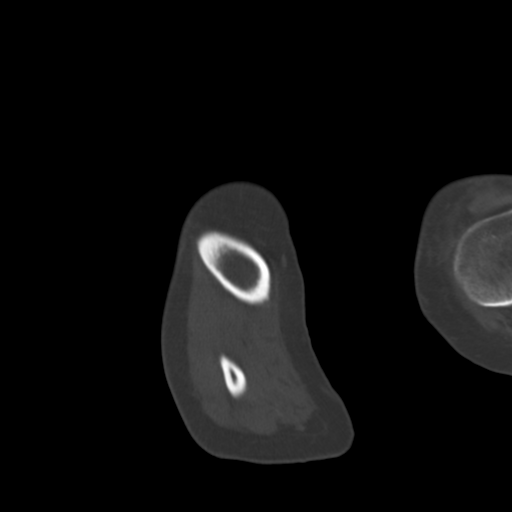
[im 15/65  bone]
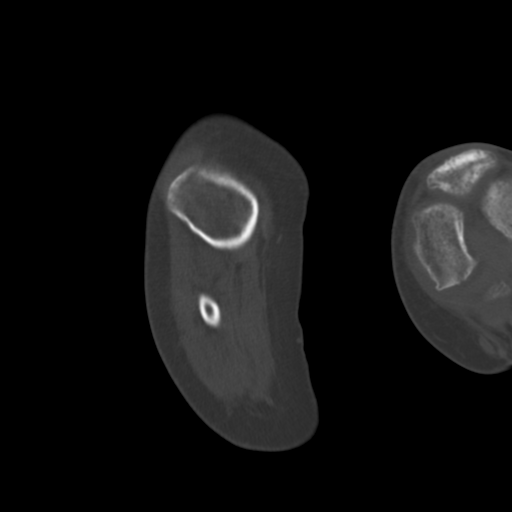
[im 20/65  bone]
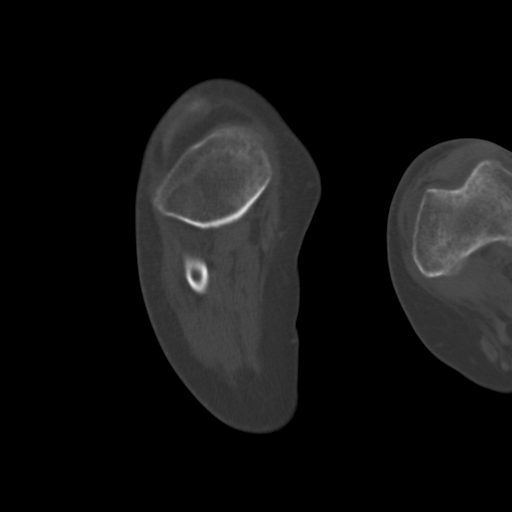
[im 30/65  bone]
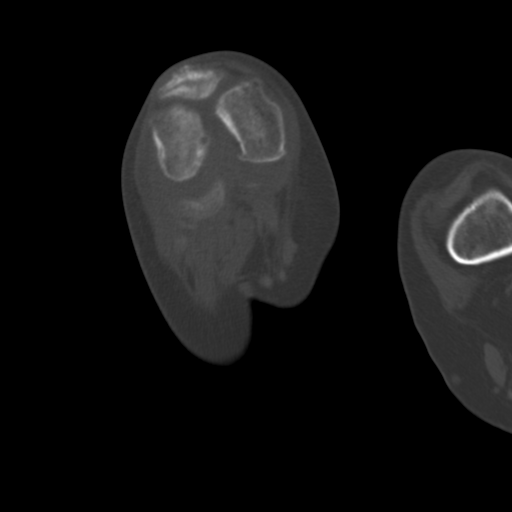
[im 35/65  soft-tissue]
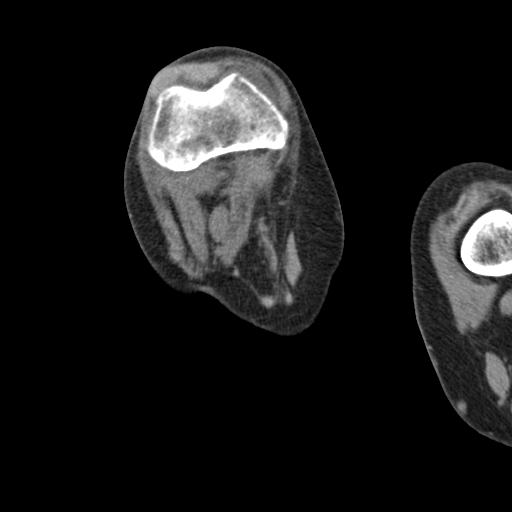
[im 35/65  bone]
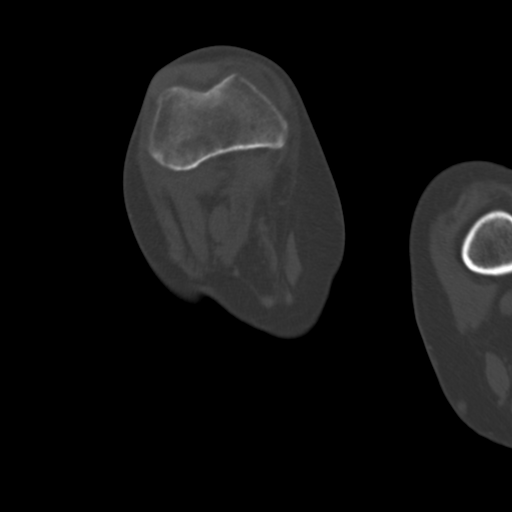
[im 45/65  bone]
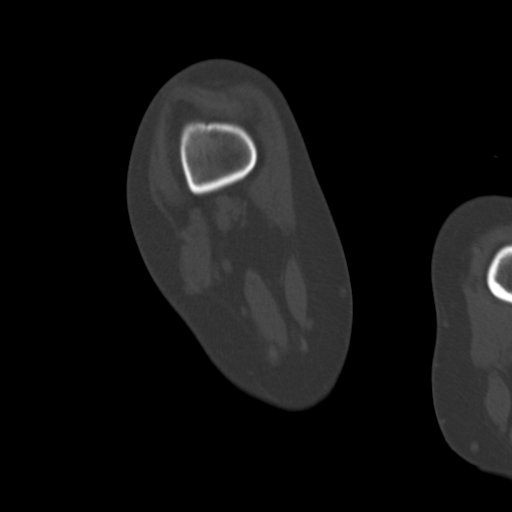
[im 50/65  bone]
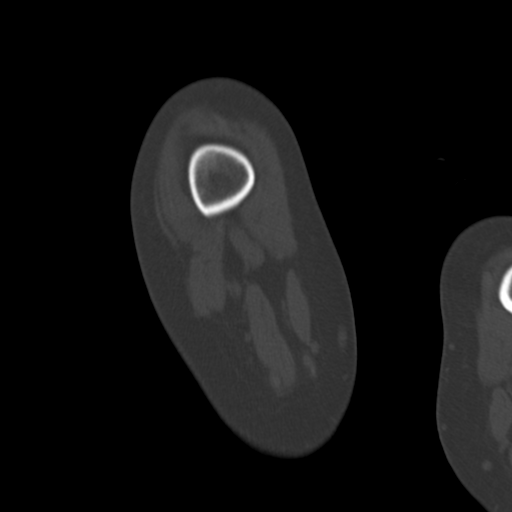
[im 60/65  bone]
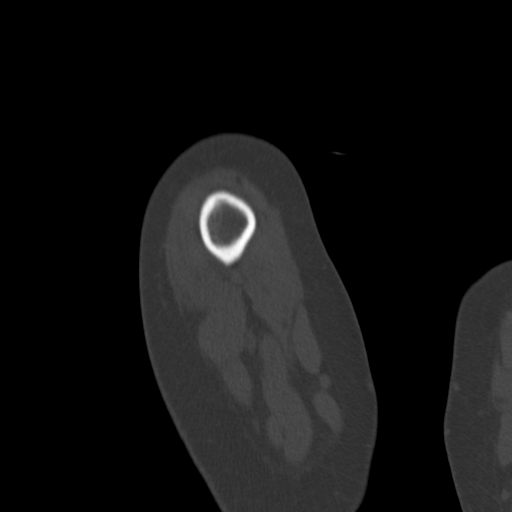

[Series 10: sagittalsoft tissue · sagittal · 0.34mm/px · 5 of 63 slices shown, 6 images]
[im 21/63  bone]
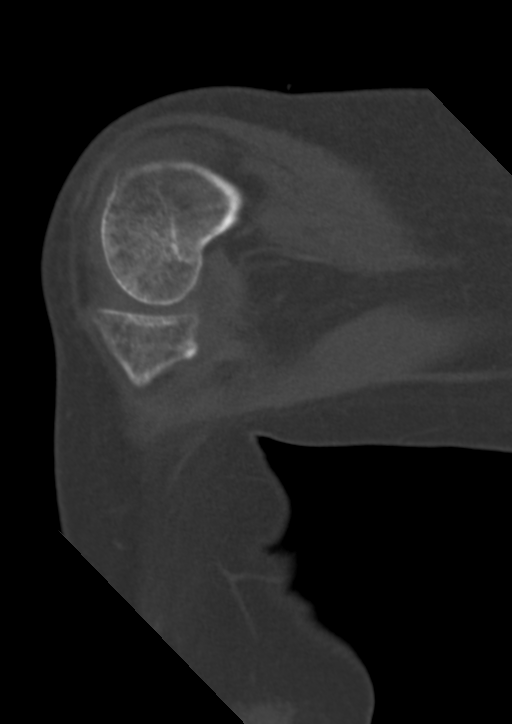
[im 26/63  bone]
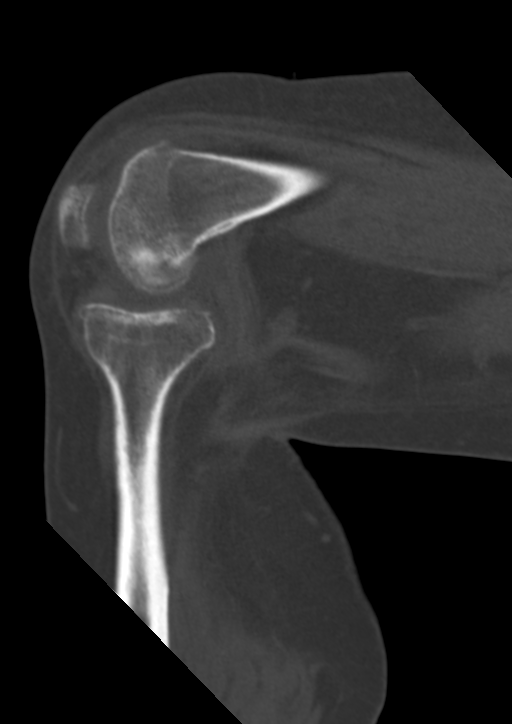
[im 32/63  soft-tissue]
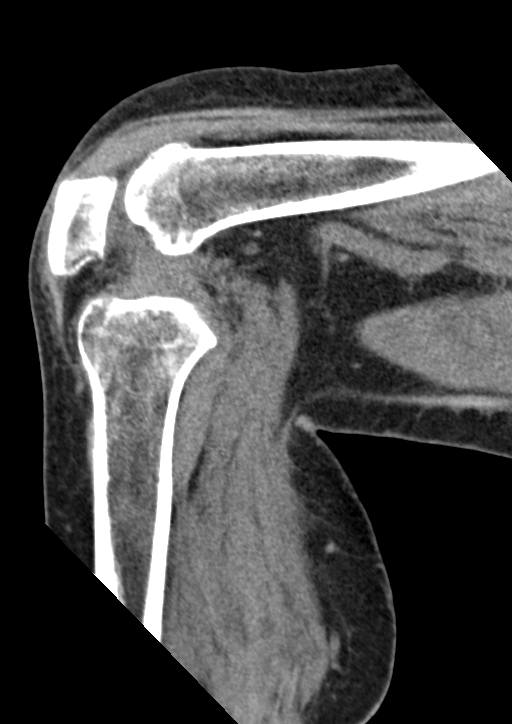
[im 32/63  bone]
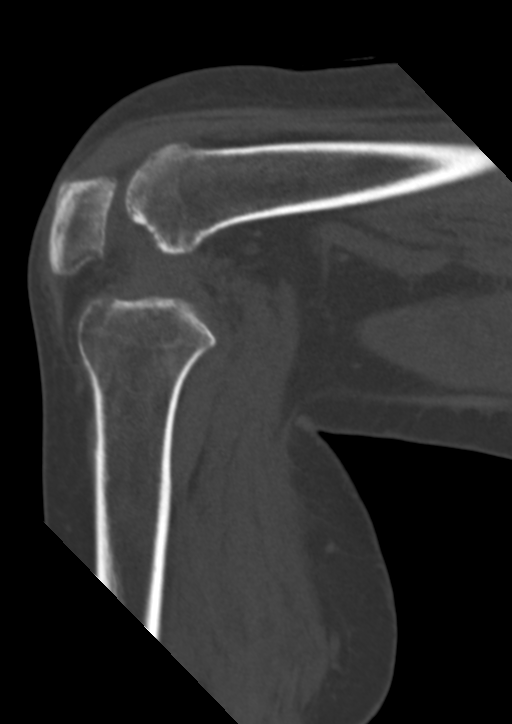
[im 37/63  bone]
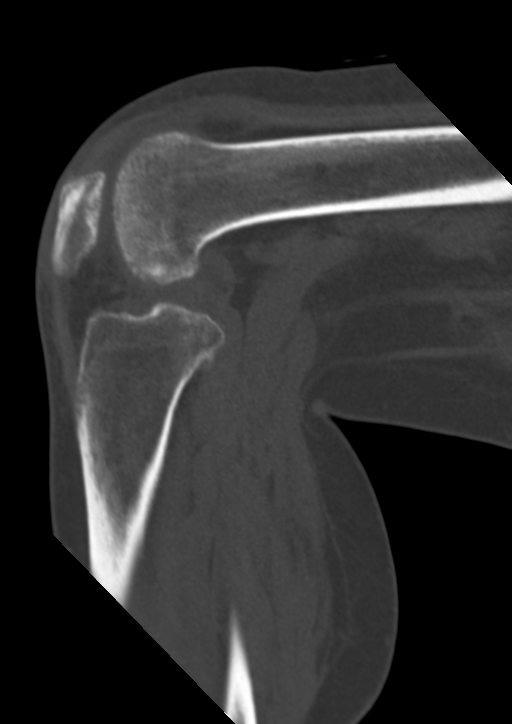
[im 42/63  bone]
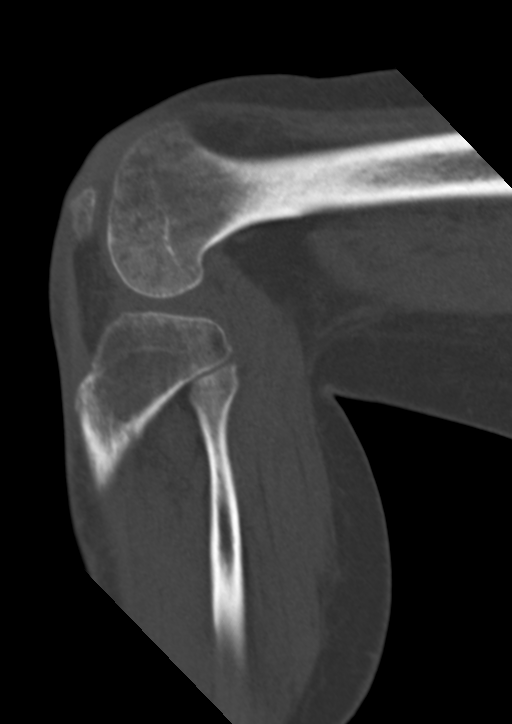

[13 of 27 positions shown; findings below may reference images not displayed]

FINDINGS: Bones/Joint/Cartilage

There is no evidence of acute fracture. There is mild tricompartment
degenerative changes. Small joint effusion. The knee is held in
approximately 90 degrees flexion. Tiny suprapatellar enthesophyte.

Ligaments

Suboptimally assessed by CT.

Muscles and Tendons

No muscle atrophy.  No obvious tendon pathology on noncontrast CT.

Soft tissues

No focal fluid collection. No evidence of soft tissue mass on
noncontrast CT. Poorly visualized posterior horn of the lateral
meniscus.
IMPRESSION: No evidence of acute fracture. Mild tricompartment osteoarthritis.
Small joint effusion.

Poorly visualized posterior horn of the lateral meniscus, possibly
torn. Consider MRI as this would be the test of choice to evaluate
for internal derangement of the knee.

## 2020-12-23 NOTE — Progress Notes (Signed)
Occupational Therapy Treatment Patient Details Name: Charlene [name] MRN: [id number] DOB: 01/20/1966 Today's Date: 12/23/2020    History of present illness Pt is 55 y/o female presents to [facility] on 08/26/20 with L sided weakness and headaches. 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 s/p Right frontal ventricular catheter placement on 2/10, stopped functioning on 2/11.  MRI 2/11 shows right greater than left mesial temporal lobes / parahippocampal cortex, bilateral basal ganglia, and possibly the upper pons that are concerning for acute infarcts, most likely secondary to mass effect from the hemorrhage. ETT 2/10-2/24.  Pt with trach and PEG on 09/10/20. Hospital course complicated by persistent fever, Serratia pneumonia and now pre-orbital cellulitis.   PMH includes anxiety, HTN.   OT comments  Patient supine in bed, scanning towards therapist fully to locate on L side today (when no input on R side).  Noticeably fatigued today, family member reports just returned from CT (for knee imaging). Total assist for hygiene care from bed level with NT assist, total assist +2 to reposition into chair position in bed.  Max hand over hand assist to wash face, pt able to hold wash cloth once placed in hand and requires support to bring to face.  Once wash cloth on face, pt initiates movement to "wash" but requires max assist for motor planning and control. Able to squeeze with R hand today, delayed but otherwise not following further commands with R UE today (I anticipate due to fatigue).  Will follow acutely.    Follow Up Recommendations  SNF;Supervision/Assistance - 24 hour    Equipment Recommendations  None recommended by OT    Recommendations for Other Services      Precautions / Restrictions Precautions Precautions: Fall Precaution Comments: peg and trach Required  Braces or Orthoses: Other Brace Other Brace: RLE soft knee brace and LUE soft elbow brace (wearing schedule in RN orders); bil PRAFOs Restrictions Weight Bearing Restrictions: No       Mobility Bed Mobility Overal bed mobility: Needs Assistance Bed Mobility: Rolling Rolling: Total assist         General bed mobility comments: total assist to roll in bed for hygiene after incontient BM and pull up in bed, placed bed into chair position for ADL engagement    Transfers                      Balance                                           ADL either performed or assessed with clinical judgement   ADL Overall ADL's : Needs assistance/impaired     Grooming: Wash/dry face;Maximal assistance Grooming Details (indicate cue type and reason): max Hand over hand support to wash face using R UE, she was able to hold washcloth but required assist to motor plan and guide hand during task                     Toileting- Clothing Manipulation and Hygiene: Total assistance;Bed level Toileting - Clothing Manipulation Details (indicate cue type and reason): total asisst supine with +2 assist     Functional mobility during ADLs: +2 for physical assistance;Total assistance       Vision  Additional Comments: pt scanning fully towards L side today to locate therapist, only successful when no stimulation present on R side.   Perception     Praxis      Cognition Arousal/Alertness: Lethargic Behavior During Therapy: Flat affect Overall Cognitive Status: Impaired/Different from baseline Area of Impairment: Following commands                       Following Commands: Follows one step commands inconsistently                Exercises     Shoulder Instructions       General Comments family member present during session; patient fatigued today, family member reports pt just returned from CT (for knee imaging).  Therapist provided  gentle PROM to L elbow and R knee  in order to don soft braces during session.    Pertinent Vitals/ Pain       Pain Assessment: Faces Faces Pain Scale: No hurt  Home Living                                          Prior Functioning/Environment              Frequency  Min 2X/week        Progress Toward Goals  OT Goals(current goals can now be found in the care plan section)  Progress towards OT goals: Progressing toward goals  Acute Rehab OT Goals Patient Stated Goal: Pt unable to state  Plan Discharge plan remains appropriate;Frequency remains appropriate    Co-evaluation                 AM-PAC OT "6 Clicks" Daily Activity     Outcome Measure   Help from another person eating meals?: A Lot Help from another person taking care of personal grooming?: A Lot Help from another person toileting, which includes using toliet, bedpan, or urinal?: Total Help from another person bathing (including washing, rinsing, drying)?: Total Help from another person to put on and taking off regular upper body clothing?: Total Help from another person to put on and taking off regular lower body clothing?: Total 6 Click Score: 8    End of Session Equipment Utilized During Treatment: Oxygen  OT Visit Diagnosis: Muscle weakness (generalized) (M62.81);Apraxia (R48.2);Cognitive communication deficit (R41.841);Hemiplegia and hemiparesis Symptoms and signs involving cognitive functions: Other Nontraumatic ICH Hemiplegia - Right/Left: Right Hemiplegia - caused by: Other Nontraumatic intracranial hemorrhage   Activity Tolerance Patient limited by fatigue   Patient Left in bed;with call bell/phone within reach;Other (comment);with family/visitor present (bed in chair position)   Nurse Communication Mobility status        Time: 0626-9485 OT Time Calculation (min): 29 min  Charges: OT General Charges $OT Visit: 1 Visit OT Treatments $Self Care/Home Management  : 23-37 mins  Barry Brunner, OT Acute Rehabilitation Services Pager [phone] Office [phone]    [name] 12/23/2020, 1:50 PM

## 2020-12-23 NOTE — Plan of Care (Signed)
  Problem: Coping: Goal: Will identify appropriate support needs Outcome: Progressing   Problem: Health Behavior/Discharge Planning: Goal: Ability to manage health-related needs will improve Outcome: Progressing   Problem: Self-Care: Goal: Verbalization of feelings and concerns over difficulty with self-care will improve Outcome: Progressing Goal: Ability to communicate needs accurately will improve Outcome: Progressing   Problem: Nutrition: Goal: Risk of aspiration will decrease Outcome: Progressing   Problem: Intracerebral Hemorrhage Tissue Perfusion: Goal: Complications of Intracerebral Hemorrhage will be minimized Outcome: Progressing   Problem: Education: Goal: Knowledge of General Education information will improve Description: Including pain rating scale, medication(s)/side effects and non-pharmacologic comfort measures Outcome: Progressing   Problem: Health Behavior/Discharge Planning: Goal: Ability to manage health-related needs will improve Outcome: Progressing   Problem: Clinical Measurements: Goal: Ability to maintain clinical measurements within normal limits will improve Outcome: Progressing Goal: Will remain free from infection Outcome: Progressing Goal: Diagnostic test results will improve Outcome: Progressing Goal: Respiratory complications will improve Outcome: Progressing Goal: Cardiovascular complication will be avoided Outcome: Progressing   Problem: Coping: Goal: Level of anxiety will decrease Outcome: Progressing   Problem: Elimination: Goal: Will not experience complications related to bowel motility Outcome: Progressing Goal: Will not experience complications related to urinary retention Outcome: Progressing   Problem: Pain Managment: Goal: General experience of comfort will improve Outcome: Progressing   Problem: Skin Integrity: Goal: Risk for impaired skin integrity will decrease Outcome: Progressing   Problem: Intracerebral  Hemorrhage Tissue Perfusion: Goal: Complications of Intracerebral Hemorrhage will be minimized Outcome: Progressing   Problem: Ischemic Stroke/TIA Tissue Perfusion: Goal: Complications of ischemic stroke/TIA will be minimized Outcome: Progressing   

## 2020-12-23 NOTE — Progress Notes (Signed)
[facility] PROGRESS NOTE  Charlene [name] DZH:[id number] DOB: 1965-09-11 DOA: 08/26/2020 PCP: Patient, [name] (Inactive)    October 08, 2020     10/16/2020                        10/16/2020     10/18/2020                        10/20/2020    Status: Remains inpatient appropriate because:Altered mental status, Unsafe d/c plan, IV treatments appropriate due to intensity of illness or inability to take PO and Inpatient level of care appropriate due to severity of illness   Dispo: The patient is from: Home              Anticipated d/c is to: SNF trach capable-[place]-trach team re eval 6/3 and still rec NOT to decannulate 2/2 menattion              Patient currently is not medically stable to d/c.   Difficult to place patient Yes   Level of care: Telemetry Medical  Code Status: Full Family Communication: husband Charlene [name] [phone]) is her POA and legal contact.  Charlene [name] was at bedside on 5/18 and given update.  Charlene [name] at bedside 6/6 DVT prophylaxis: Chronically bedbound.  5/6: We will discontinue prophylactic Lovenox   Vaccination status: Unvaccinated-husband still undecided.  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  Subsequently on 2/10, she developed obtundation and respiratory arrest leading to intubation.  She has remained persistently obtunded, now has a trach and a PEG.  Hospital course complicated by  persistent fever, Serratia pneumonia and now pre-orbital cellulitis  As of 3/25 with adjustments in hypertonicity medications patient began to improve regarding hypertonicity.  Eventually started on provigil with improvements in alertness.  Evaluated by rehab physician who recommended increasing provigil dose further.  Subsequently patient has become more alert.  Most days she is able to track and with encouragement able to follow simple commands especially since partial obstruction glasses placed.  See progress note dictated 4/27 for expanded details.   2/09 Admit for [place] 2/10 EVD placed, later intubated due to obtundation and respiratory arrest 2/11 MRI Brain, hypertonic saline d/c'd, EVD not working.  Husband did not consent to PICC  2/13 EVD removed 2/17 [place] discussion with family, PMT 2/24 Bedside trach and PEG 3/1 Transferred to [facility]  Subjective: Alert.  Ex daughter-in-law at bedside interacting with patient.  Patient noted to have recurrent right knee pain and is grabbing at knee.  She did squeeze my hand when asked but was unable to cough or stick out tongue when asked although she did try to open her mouth and stick out her tongue but was unable.  Objective: Vitals:   12/23/20 0426 12/23/20 0730  BP: (!) 150/86 (!) 151/77  Pulse: 96 98  Resp: 18 16  Temp:  98.6 F (37  C)  SpO2: 98% 100%    Intake/Output Summary (Last 24 hours) at 12/23/2020 0810 Last data filed at 12/23/2020 0244 Gross per 24 hour  Intake --  Output 1000 ml  Net -1000 ml   Filed Weights   12/14/20 0400 12/15/20 0400 12/17/20 0500  Weight: 56.6 kg 54.9 kg 55.2 kg    Exam:  Constitutional: Alert, calm, no acute distress Respiratory: #4.0 cuffless trach, trach collar with FiO2 21% at 5L, lungs are clear.  No increased work of breathing.  No tracheal secretions. Cardiovascular: S1-S2, mild pedal edema but otherwise no peripheral edema, regular pulse Abdomen:  PEG full tube feeding-LBM 6/5; abdomen otherwise  soft and nontender with normoactive bowel sound Musculoskeletal: Bilateral atrophy of thigh and calf muscles-continues to have intermittent right hip pain, some left knee pain but primarily has right knee pain Neurologic: Alert, spontaneous movement of RUE-intermittantly purposeful. Subtle L facial droop, minimal movement LEs.  Psych: Awake, nonverbal so unable to accurately assess orientation.  She does follow some commands and does track with her eyes   Assessment/Plan: Acute problems: Multiple acute strokes  Right Thalamus ICH with IVH  Obstructive Hydrocephalus/Acute on persistent metabolic Encephalopathy/brain injury related hypertonicity -Continue baclofen to 55m TID and HS Tizanadine to 4 mg HS for spasticity.  -Continue Provigil -Continue PT/OT/SLP; demonstrates purposeful actions during therapies including reaching for objects and attempting to feed self with assistance/assistive devices -5/2, CIR MD reevaluated patient " Mrs. SAnnettunfortunately has not made any meaningful functional gains over the last month. She remains most appropriate for SNF level care".   Colonic Ileus -6/6 resolved based on follow-up abdominal films -Continue Miralax daily -As of 6/8 last BM on 6/5-May need to give one-time dose of milk of magnesia per tube  Bilateral knee osteoarthritis/reported ligamentous injury to right knee/right hip pain -Continue Voltaren gel; -Has completed 7 days of continue scheduled Tylenol and ibuprofen  -Continue short course of prednisone -According to husband patient has a remote MCL tear.  Given recurrent right knee pain -we will change MRI to CT since patient may tolerate this procedure better -Voltaren gel to right hip as well  Hypertensive emergency/Malignant HTN -Due to financial constraints patient was not taking carvedilol and hydrochlorothiazide prior to admission -Continue Norvasc, Lopressor and hydralazine. -Also on Flomax for urinary retention   Acute  hypoxemic respiratory failure secondary to CVA requiring mechanical ventilation (Resolved) Trach dependent -Downsized to #4 cuffless trach (5/8) -Despite tolerating capping trials trach team documents her baseline encephalopathy and poor cough mechanics preclude decannulation given extreme risk of airway compromise (death from aspiration).  They state she is not a candidate for decannulation and as of 5/30 they have signed off although recommended to call back if mental status improves  Dysphagia -Continue tube feedings, Prosource liquid, free water -Follow CBGs and provide SSI -Continue supervised pured diet allowing family to administer orals after adequate training  History of urinary retention/recurrent Klebsiella UTI -Continue Cardura -Discussed with ID pharmacist.  Continue Rocephin for total of 7 days     Other problems: Right upper extremity thrombophlebitis - [place]-> age indeterminate SVT of right cephalic vein  Neck Contracture/torticollis -- Resolved  ESBL positive Enterobacter UTI -Has completed 5 days of meropenem -Contact isolation during hospitalization -continue Oxybutynin suspected bladder spasms  Klebsiella UTI -Resistant to nitrofurantoin and ampicillin -Meropenem transitioned to Rocephin on 5/22 -To minimize risk of recurrent UTI have asked that pure wick only be used at bedtime on a 10 PM to 8 AM schedule  Recurrent diarrhea -Resolved -Likely combination of recent antibiotics as well as scheduled laxative -MiraLAX now as needed  Bacterial Conjunctivitis /periorbital cellulitis -Resolved  Chronic pansinusitis/recurrent fevers 2/2 aspiration PNA;History of Serratia Pneumonia  Hospital Acquired Pneumonia:  -Completed several courses of antibiotic -Follow-up imaging including sinus CT consistent with chronic but stable and resolving sinusitis   Anemia -Stable  HLD -Continue Lipitor      Data Reviewed: Basic Metabolic Panel: Recent Labs  Lab  12/18/20 1014 12/19/20 0325  NA 139 140  K 3.7 3.9  CL 101 102  CO2 31 29  GLUCOSE 106* 110*  BUN 16 11  CREATININE 0.49 0.44  CALCIUM 9.5 9.4  MG 1.9  --    Liver Function Tests: Recent Labs  Lab 12/19/20 0325  AST 27  ALT 35  ALKPHOS 97  BILITOT 0.5  PROT 7.0  ALBUMIN 3.2*   CBC: Recent Labs  Lab 12/18/20 1014 12/19/20 0325  WBC 5.5 5.0  NEUTROABS 2.8 2.3  HGB 11.0* 10.9*  HCT 35.0* 34.4*  MCV 80.5 80.2  PLT 315 281    CBG: Recent Labs  Lab 12/22/20 1807 12/22/20 2013 12/23/20 0005 12/23/20 0335 12/23/20 0729  GLUCAP 138* 106* 136* 97 146*     Studies: DG Abd Portable 1V  Result Date: 12/21/2020 CLINICAL DATA:  Abdominal tightness.  Gastrostomy catheter present EXAM: PORTABLE ABDOMEN - 1 VIEW COMPARISON:  December 18, 2020 FINDINGS: Gastrostomy catheter overlies the stomach in the medial left upper abdomen. There is moderate air in bowel without bowel dilatation. No air-fluid levels. No evident free air on supine examination. No abnormal calcifications. IMPRESSION: Gastrostomy catheter overlies stomach. No findings suggesting bowel obstruction on supine examination. No free air on supine examination. Electronically Signed   By: Lowella Grip III M.D.   On: 12/21/2020 09:14    Scheduled Meds: . amLODipine  10 mg Per Tube Daily  . atorvastatin  40 mg Per Tube Daily  . baclofen  10 mg Per Tube 3 times per day  . chlorhexidine gluconate (MEDLINE KIT)  15 mL Mouth Rinse BID  . Chlorhexidine Gluconate Cloth  6 each Topical Daily  . diclofenac Sodium  4 g Topical QID  . doxazosin  2 mg Per Tube Daily  . feeding supplement (PROSource TF)  45 mL Per Tube Daily  . free water  200 mL Per Tube Q4H  . hydrALAZINE  50 mg Per Tube Q8H  . insulin aspart  0-9 Units Subcutaneous Q4H  . mouth rinse  15 mL Mouth Rinse 5 X Daily  . metoprolol tartrate  100 mg Per Tube BID  . modafinil  200 mg Per Tube Daily  . polyethylene glycol  17 g Per Tube Daily  . tiZANidine  4  mg Per Tube QHS   Continuous Infusions: . sodium chloride Stopped (10/26/20 1445)  . cefTRIAXone (ROCEPHIN)  IV 1 g (12/22/20 1350)  . feeding supplement (OSMOLITE 1.2 CAL) 55 mL/hr at 12/20/20 4142    Principal Problem:   ICH (intracerebral hemorrhage) (HCC) Active Problems:   Acute hypoxemic respiratory failure (HCC)   Status post tracheostomy ([place])   Palliative care by specialist   Muscle hypertonicity   Oropharyngeal dysphagia   Poor prognosis   Aspiration pneumonia of right lower lobe ([place])   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   Acute cystitis without hematuria   Ileus, unspecified ([place])   Constipation   Consultants:  Neurology   Neurosurgery.   Palliative care.  PCCM continues to follow.  No decannulation.   Procedures:  Tracheostomy  PEG tube  EEG  Echocardiogram  Antibiotics: Vancomycin 2/12-2/15 Maxipime 2/12-2/15 Ancef 2/19-2/27 Vancomycin 2/20 x 1 dose Maxipime 2/27- 3/5 Vancomycin 3/1-3/3 Ancef 3/6-3/14 Cipro 3/20 4-3/30 Unasyn 3/30-4/4 Rocephin 4/11-4/12 Meropenem 4/12-4/17   Time spent: 25 minutes    Erin Hearing ANP  [facility] 7 am - 330 pm/M-F for direct patient care and secure chat Please refer to [place] for contact info 118  Days

## 2020-12-23 NOTE — Progress Notes (Signed)
Physical Therapy Treatment Patient Details Name: Charlene [name] MRN: [id number] DOB: 1965/07/21 Today's Date: 12/23/2020    History of Present Illness Pt is 55 y/o female presents to [facility] on 08/26/20 with L sided weakness and headaches. 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 s/p Right frontal ventricular catheter placement on 2/10, stopped functioning on 2/11.  MRI 2/11 shows right greater than left mesial temporal lobes / parahippocampal cortex, bilateral basal ganglia, and possibly the upper pons that are concerning for acute infarcts, most likely secondary to mass effect from the hemorrhage. ETT 2/10-2/24.  Pt with trach and PEG on 09/10/20. Hospital course complicated by persistent fever, Serratia pneumonia and now pre-orbital cellulitis.   PMH includes anxiety, HTN.    PT Comments    I have not see Charlene [name] in a few sessions now and it is great to see some sutle gains.  She is very consistently in midline with her gaze, making more purposeful movements to stimuli (especially pain), and did well with me EOB for ~ 30 mins.  Family was fully engaged and participating hands on in her session.  We will continue to progress her mobility and provide varying stimuli to produce responses both physically and cognitively.     Follow Up Recommendations  SNF     Equipment Recommendations  Hospital bed;Wheelchair cushion (measurements PT);Wheelchair (measurements PT);Other (comment) (18x18 tilt in space WC, hoyer lift)    Recommendations for Other Services       Precautions / Restrictions Precautions Precautions: Fall Precaution Comments: peg and trach Required Braces or Orthoses: Other Brace Other Brace: RLE soft knee brace and LUE soft elbow brace (wearing schedule in RN orders); bil PRAFOs    Mobility  Bed Mobility Overal bed mobility: Needs  Assistance Bed Mobility: Rolling;Sidelying to Sit;Sit to Supine Rolling: Total assist Sidelying to sit: Total assist   Sit to supine: Total assist   General bed mobility comments: Trunk is very stiff to the point that if you roll her knees and lower legs her trunk and pelvis will come with it.  Supine in the bed worked on trunk rotation bil while keeping shoulder from following trunk.  Cervical extension ROM (just by removing pillow) and left cervical rotation ROM, right side bend ROM at c-spine).  Still no initiation to assist in transitional movements.    Transfers                    Ambulation/Gait                 Stairs             Wheelchair Mobility    Modified Rankin (Stroke Patients Only)       Balance Overall balance assessment: Needs assistance Sitting-balance support: Feet supported;Single extremity supported Sitting balance-Leahy Scale: Poor Sitting balance - Comments: Sat EOB for greater than 30 mins working on R ue prop on bed and knee, transitions to elbows bil and back up to sitting, cervical and trunk ext, rotation, command following.  Pt always responds so much more briskly to husband than any other person/family member.                                    Cognition Arousal/Alertness: Awake/alert (once positioned EOB) Behavior During Therapy: Flat  affect Overall Cognitive Status: Impaired/Different from baseline Area of Impairment: Attention;Following commands;Problem solving                   Current Attention Level: Sustained (breif) Memory: Decreased short-term memory Following Commands: Follows one step commands with increased time;Follows one step commands inconsistently   Awareness: Intellectual Problem Solving: Slow processing;Decreased initiation;Difficulty sequencing;Requires verbal cues;Requires tactile cues General Comments: Follows ~10% simple commands (inconsitantly) given increased time to process,  but often lose attention before she processes the command.  She does do much more movement to related stimulus (held a rose for daughter in law earlier and took it to her nose to smell it, grabs thearpists hands to move them away when doing uncomfortable ROM.  Marland Kitchen      Exercises      General Comments General comments (skin integrity, edema, etc.): ex daughter in law present as well as husband, Charlene [name].  Both are very hands on helping therapist throughout treatment session.      Pertinent Vitals/Pain      Home Living                      Prior Function            PT Goals (current goals can now be found in the care plan section) Acute Rehab PT Goals Patient Stated Goal: Pt unable to state Progress towards PT goals: Progressing toward goals    Frequency    Min 2X/week      PT Plan Current plan remains appropriate    Co-evaluation              AM-PAC PT "6 Clicks" Mobility   Outcome Measure  Help needed turning from your back to your side while in a flat bed without using bedrails?: Total Help needed moving from lying on your back to sitting on the side of a flat bed without using bedrails?: Total Help needed moving to and from a bed to a chair (including a wheelchair)?: Total Help needed standing up from a chair using your arms (e.g., wheelchair or bedside chair)?: Total Help needed to walk in hospital room?: Total Help needed climbing 3-5 steps with a railing? : Total 6 Click Score: 6    End of Session   Activity Tolerance: Patient tolerated treatment well Patient left: in bed;with call bell/phone within reach;with family/visitor present   PT Visit Diagnosis: Hemiplegia and hemiparesis;Other symptoms and signs involving the nervous system (R29.898);Other abnormalities of gait and mobility (R26.89) Hemiplegia - Right/Left: Right Hemiplegia - dominant/non-dominant: Dominant Hemiplegia - caused by: Cerebral infarction;Nontraumatic intracerebral  hemorrhage;Other Nontraumatic intracranial hemorrhage     Time: 5885-0277 PT Time Calculation (min) (ACUTE ONLY): 37 min  Charges:  $Therapeutic Activity: 8-22 mins $Neuromuscular Re-education: 8-22 mins                     Corinna Capra, PT, DPT  Acute Rehabilitation Ortho Tech Supervisor [phone] pager [phone]) [phone] office

## 2020-12-24 DIAGNOSIS — K5901 Slow transit constipation: Secondary | ICD-10-CM

## 2020-12-24 LAB — GLUCOSE, CAPILLARY
Glucose-Capillary: 101 mg/dL — ABNORMAL HIGH (ref 70–99)
Glucose-Capillary: 110 mg/dL — ABNORMAL HIGH (ref 70–99)
Glucose-Capillary: 122 mg/dL — ABNORMAL HIGH (ref 70–99)
Glucose-Capillary: 125 mg/dL — ABNORMAL HIGH (ref 70–99)
Glucose-Capillary: 133 mg/dL — ABNORMAL HIGH (ref 70–99)
Glucose-Capillary: 169 mg/dL — ABNORMAL HIGH (ref 70–99)

## 2020-12-24 MED ORDER — POLYETHYLENE GLYCOL 3350 17 G PO PACK: 17.0000 g | PACK | Freq: Every day | ORAL | Status: DC | PRN

## 2020-12-24 MED ORDER — TRAMADOL 5 MG/ML ORAL SUSPENSION: 25.0000 mg | Freq: Four times a day (QID) | ORAL | Status: DC

## 2020-12-24 MED ORDER — TRAMADOL HCL 50 MG PO TABS
25.0000 mg | ORAL_TABLET | Freq: Four times a day (QID) | ORAL | Status: DC
  Administered 2020-12-24 – 2020-12-25 (×5): 25 mg via ORAL
  Filled 2020-12-24 (×7): qty 1

## 2020-12-24 NOTE — Progress Notes (Signed)
Physical Therapy Treatment Patient Details Name: Charlene [name] MRN: [id number] DOB: 03-31-1966 Today's Date: 12/24/2020    History of Present Illness Pt is 55 y/o female presents to [facility] on 08/26/20 with L sided weakness and headaches. 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 s/p Right frontal ventricular catheter placement on 2/10, stopped functioning on 2/11.  MRI 2/11 shows right greater than left mesial temporal lobes / parahippocampal cortex, bilateral basal ganglia, and possibly the upper pons that are concerning for acute infarcts, most likely secondary to mass effect from the hemorrhage. ETT 2/10-2/24.  Pt with trach and PEG on 09/10/20. Hospital course complicated by persistent fever, Serratia pneumonia and now pre-orbital cellulitis.   PMH includes anxiety, HTN.    PT Comments    Short session due to time restraints of physical therapist (pt can always tolerate a much longer session with me).  I was able to get her clean, dry, repositioned in high chair mode and stretched flat for a few minutes.  I exchanged her large pillow for a flatter one to encourage more neck extension and repositioned with pillow under right hip so she would rotate more to the left (she is getting tighter in both trunk rotation and bil knee flexion preference (along with her gaze) to the right.  Plant to tilt next session and alternate tilt, EOB and WC each session to provide different kids of therapeutic stimuli.   Follow Up Recommendations  SNF     Equipment Recommendations  Hospital bed;Wheelchair cushion (measurements PT);Wheelchair (measurements PT);Other (comment) (18x18 tilt in space, hoyer lift)    Recommendations for Other Services       Precautions / Restrictions Precautions Precautions: Fall Precaution Comments: peg and trach Required Braces or Orthoses:  Other Brace Other Brace: RLE soft knee brace and LUE soft elbow brace (wearing schedule in RN orders); bil PRAFOs    Mobility  Bed Mobility Overal bed mobility: Needs Assistance Bed Mobility: Rolling Rolling: Total assist         General bed mobility comments: Rolled bil for peri care and bed clean up, total assist.  total assist to scoot to [facility].    Transfers                    Ambulation/Gait                 Stairs             Wheelchair Mobility    Modified Rankin (Stroke Patients Only)       Balance                                            Cognition Arousal/Alertness: Awake/alert Behavior During Therapy: Flat affect Overall Cognitive Status: Impaired/Different from baseline Area of Impairment: Attention;Following commands;Problem solving                   Current Attention Level: Focused   Following Commands: Follows one step commands with increased time;Follows one step commands inconsistently Safety/Judgement: Decreased awareness of safety;Decreased awareness of deficits Awareness: Intellectual Problem Solving: Slow processing;Decreased initiation;Difficulty sequencing;Requires verbal cues;Requires tactile cues General Comments: Pt very alert today, looking and rotated toward sunny window on her right.  PT positioned her to turn more  to the left at end of session as her trunk and legs and head are all starting to have a firm rotational preferece that direction.      Exercises      General Comments General comments (skin integrity, edema, etc.): removed L elbow and R knee soft braces, moved pillow from behid left hip to behind R hip, re-positioned into high chair mode with pillows under all extermities for positioning and comfort.      Pertinent Vitals/Pain Pain Assessment: Faces Faces Pain Scale: Hurts little more Pain Location: generalized with repositioning and mobility Pain Descriptors / Indicators:  Grimacing;Guarding Pain Intervention(s): Limited activity within patient's tolerance;Monitored during session;Repositioned    Home Living                      Prior Function            PT Goals (current goals can now be found in the care plan section) Progress towards PT goals: Progressing toward goals    Frequency    Min 2X/week      PT Plan Current plan remains appropriate    Co-evaluation              AM-PAC PT "6 Clicks" Mobility   Outcome Measure  Help needed turning from your back to your side while in a flat bed without using bedrails?: Total Help needed moving from lying on your back to sitting on the side of a flat bed without using bedrails?: Total Help needed moving to and from a bed to a chair (including a wheelchair)?: Total Help needed standing up from a chair using your arms (e.g., wheelchair or bedside chair)?: Total Help needed to walk in hospital room?: Total Help needed climbing 3-5 steps with a railing? : Total 6 Click Score: 6    End of Session   Activity Tolerance: Patient tolerated treatment well Patient left: in bed;with call bell/phone within reach   PT Visit Diagnosis: Hemiplegia and hemiparesis;Other symptoms and signs involving the nervous system (R29.898);Other abnormalities of gait and mobility (R26.89) Hemiplegia - Right/Left: Right Hemiplegia - dominant/non-dominant: Dominant Hemiplegia - caused by: Cerebral infarction;Nontraumatic intracerebral hemorrhage;Other Nontraumatic intracranial hemorrhage     Time: 6644-0347 PT Time Calculation (min) (ACUTE ONLY): 15 min  Charges:  $Therapeutic Activity: 8-22 mins                     Corinna Capra, PT, DPT  Acute Rehabilitation Ortho Tech Supervisor [phone] pager [phone]) [phone] office

## 2020-12-24 NOTE — Plan of Care (Signed)
  Problem: Coping: Goal: Will identify appropriate support needs Outcome: Progressing   Problem: Health Behavior/Discharge Planning: Goal: Ability to manage health-related needs will improve Outcome: Progressing   Problem: Self-Care: Goal: Verbalization of feelings and concerns over difficulty with self-care will improve Outcome: Progressing Goal: Ability to communicate needs accurately will improve Outcome: Progressing   Problem: Nutrition: Goal: Risk of aspiration will decrease Outcome: Progressing   Problem: Intracerebral Hemorrhage Tissue Perfusion: Goal: Complications of Intracerebral Hemorrhage will be minimized Outcome: Progressing   Problem: Education: Goal: Knowledge of General Education information will improve Description: Including pain rating scale, medication(s)/side effects and non-pharmacologic comfort measures Outcome: Progressing   Problem: Health Behavior/Discharge Planning: Goal: Ability to manage health-related needs will improve Outcome: Progressing   Problem: Clinical Measurements: Goal: Ability to maintain clinical measurements within normal limits will improve Outcome: Progressing Goal: Will remain free from infection Outcome: Progressing Goal: Diagnostic test results will improve Outcome: Progressing Goal: Respiratory complications will improve Outcome: Progressing Goal: Cardiovascular complication will be avoided Outcome: Progressing   Problem: Coping: Goal: Level of anxiety will decrease Outcome: Progressing   Problem: Elimination: Goal: Will not experience complications related to bowel motility Outcome: Progressing Goal: Will not experience complications related to urinary retention Outcome: Progressing   Problem: Pain Managment: Goal: General experience of comfort will improve Outcome: Progressing   Problem: Skin Integrity: Goal: Risk for impaired skin integrity will decrease Outcome: Progressing   Problem: Intracerebral  Hemorrhage Tissue Perfusion: Goal: Complications of Intracerebral Hemorrhage will be minimized Outcome: Progressing   Problem: Ischemic Stroke/TIA Tissue Perfusion: Goal: Complications of ischemic stroke/TIA will be minimized Outcome: Progressing   

## 2020-12-24 NOTE — Progress Notes (Signed)
Nutrition Follow-up  DOCUMENTATION CODES:  Not applicable  INTERVENTION:  Continue Tube feeding via PEG tube: -Osmolite 1.2 at 60 ml/h (1440 ml per day) -45 ml ProSource TF daily   Provides 1768 kcal, 90 gm protein, 1180 ml free water daily   -200 ml free water every 4 hours    NUTRITION DIAGNOSIS:  Inadequate oral intake related to inability to eat as evidenced by NPO status. 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  Subsequently on 2/10, pt developed obtundation and respiratory arrest leading to intubation. Pt is now s/p trach/PEG placement. Hospital course complicated by Serratia pneumonia, ESBL Enterobacter UTI and pre-orbital cellulitis.   2/10 s/p EVD placement and intubation for respiratory arrest 2/13 EVD removed 2/17 palliative care consult; family requesting tx to [facility]  2/24 s/p trach and PEG  3/1 tx to [facility] 3/11 trach changed to Sac #6 cuffless 4/08 pt w/ sepsis 2/2 UTI 4/18 28% ATC/5L 5/2 CIR reevaluated pt and determined she has not made enough improvement for admit; recommended SNF 5/8 trach changed to shiley #4 cuffless  5/23 CCM evaluated and determined pt stilll unable to decannulate due to variable mental status 6/3 trach exchanged, still shiley #4 cuffless  Pt more alert and tracking. Per MD, ileus resolved as of 6/6. CT of R knee today revealed mild tricompartmental OA w/ small joint effusion with findings confirming likely torn MCL. Pt remains NPO with SLP allowing supervised pureed diet and plan to allow family to administer orals after adequate training. Per RN, pt still  tolerating the following TF regimen via PEG: Osmolite 1.2 at 55 ml/h w/ 45 ml ProSource TF daily, 275m free water flushes Q4H. Note pt's weight has been trending down, and although some of this is due to changes in fluid status, will slightly increase amount of kcal/protein provided by TF in hopes of weight stabilizing. Per RN assessment, pt only with non-pitting edema to BLE at this time.   UOP: 2x unmeasured occurrences x24 hours Stool: 1x unmeasured occurrence x24 hours   Medications: SSI Q4H Labs reviewed. CBGs [id number] Diet Order:   Diet Order             Diet NPO time specified  Diet effective now                  EDUCATION NEEDS:  No education needs have been identified at this time  Skin:  Skin Assessment: Reviewed RN Assessment (MASD bilateral groin)  Last BM:  6/9 type 5  Height:  Ht Readings from Last 1 Encounters:  10/18/20 '5\' 5"'  (1.651 m)   Weight:  Wt Readings from Last 1 Encounters:  12/25/20 53.2 kg   Ideal Body Weight:  56.8 kg  BMI:  Body mass index is 19.52 kg/m.  Estimated Nutritional Needs:  Kcal:  1600-1800 Protein:  80-90 grams Fluid:  >1.6L/d    ALarkin Ina MS, RD, LDN RD pager number and weekend/on-call pager number located in [place]

## 2020-12-24 NOTE — Progress Notes (Signed)
[facility] PROGRESS NOTE  Charlene [name] FUX:[id number] DOB: July 07, 1966 DOA: 08/26/2020 PCP: Patient, [name] (Inactive)    October 08, 2020     10/16/2020                        10/16/2020     10/18/2020                        10/20/2020    Status: Remains inpatient appropriate because:Altered mental status, Unsafe d/c plan, IV treatments appropriate due to intensity of illness or inability to take PO and Inpatient level of care appropriate due to severity of illness   Dispo: The patient is from: Home              Anticipated d/c is to: SNF trach capable[place]-trach team re eval 6/3 and still rec NOT to decannulate 2/2 menattion              Patient currently is not medically stable to d/c.   Difficult to place patient Yes   Level of care: Telemetry Medical  Code Status: Full Family Communication: husband Charlene [name] [phone]) is her POA and legal contact.  Charlene [name] was at bedside on 5/18 and given update.  Charlene [name] at bedside 6/6 DVT prophylaxis: Chronically bedbound.  5/6: We will discontinue prophylactic Lovenox   Vaccination status: Unvaccinated-husband still undecided.  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  Subsequently on 2/10, she developed obtundation and respiratory arrest leading to intubation.  She has remained persistently obtunded, now has a trach and a PEG.  Hospital course complicated by  persistent fever, Serratia pneumonia and now pre-orbital cellulitis  As of 3/25 with adjustments in hypertonicity medications patient began to improve regarding hypertonicity.  Eventually started on provigil with improvements in alertness.  Evaluated by rehab physician who recommended increasing provigil dose further.  Subsequently patient has become more alert.  Most days she is able to track and with encouragement able to follow simple commands especially since partial obstruction glasses placed.  See progress note dictated 4/27 for expanded details.   2/09 Admit for [place] 2/10 EVD placed, later intubated due to obtundation and respiratory arrest 2/11 MRI Brain, hypertonic saline d/c'd, EVD not working.  Husband did not consent to PICC  2/13 EVD removed 2/17 [place] discussion with family, PMT 2/24 Bedside trach and PEG 3/1 Transferred to [facility]  Subjective: Very alert.  Mother at bedside.  Definitely tracking this writer during examination and other activity in room.  She initially smiled during right knee examination noting I was massaging an area on her knee but later after palpation above the knee she began to frown.  Purposeful movement with grabbing of glasses being held by her mother.  Objective: Vitals:   12/24/20 0025 12/24/20 0401  BP: 117/74 120/63  Pulse: 66 82  Resp: 14 15  Temp: 98.4 F (36.9 C)  SpO2:  99%   No intake or output data in the 24 hours ending 12/24/20 0759  Filed Weights   12/14/20 0400 12/15/20 0400 12/17/20 0500  Weight: 56.6 kg 54.9 kg 55.2 kg    Exam:  Constitutional: Awake, no acute distress, appears comfortable at this time Respiratory: #4.0 cuffless trach, trach collar with FiO2 21% at 5L, lung sounds are clear.  No increased work of breathing and no tracheal secretions.  O2 saturations to 100% Cardiovascular: S1-S2, no peripheral edema, regular pulse with telemetry demonstrating sinus rhythm Abdomen:  PEG full tube feeding-LBM 6/8 soft and nontender  tender with normoactive bowel sounds Musculoskeletal: Bilateral atrophy of thigh and calf muscles-continues to have some right knee pain that is worse with extension Neurologic: Alert, spontaneous movement of RUE-intermittantly purposeful. Subtle L facial droop, minimal movement LEs.  Psych: Awake, nonverbal so unable to accurately assess orientation.  She does follow some commands and does track with her eyes   Assessment/Plan: Acute problems: Multiple acute strokes  Right Thalamus ICH with IVH  Obstructive Hydrocephalus/Acute on persistent metabolic Encephalopathy/brain injury related hypertonicity -Continue baclofen to 61m TID and HS Tizanadine to 4 mg HS for spasticity.  -Continue Provigil -Continue PT/OT/SLP; demonstrates purposeful actions during therapies including reaching for objects and attempting to feed self with assistance/assistive devices -5/2, CIR MD reevaluated patient " Mrs. SDicarlounfortunately has not made any meaningful functional gains over the last month. She remains most appropriate for SNF level care".   Colonic Ileus/recurrent constipation -6/6 ileus resolved based on follow-up abdominal films -Continue Miralax daily  Bilateral knee osteoarthritis/reported ligamentous injury to right knee/right hip pain -Has completed a short course of prednisone, also treated with scheduled Tylenol and Toradol -History of remote MCL tear.  CT right knee on 6/9 with mild tricompartmental osteoarthritis with small joint effusion with findings confirm likely torn MCL -We will add scheduled Ultram and if ineffective may need to transition to low-dose oxycodone-follow LFTs intermittently while on Ultram -Voltaren gel to knees and hip   Hypertensive emergency/Malignant HTN -Due to financial constraints patient was not taking carvedilol and hydrochlorothiazide prior to admission -Continue Norvasc, Lopressor and hydralazine. -Also on Flomax for urinary retention   Acute hypoxemic  respiratory failure secondary to CVA requiring mechanical ventilation (Resolved) Trach dependent -Downsized to #4 cuffless trach (5/8) -Despite tolerating capping trials trach team documents her baseline encephalopathy and poor cough mechanics preclude decannulation given extreme risk of airway compromise (death from aspiration).  They state she is not a candidate for decannulation and as of 5/30 they have signed off although recommended to call back if mental status improves  Dysphagia -Continue tube feedings, Prosource liquid, free water -Follow CBGs and provide SSI -Continue supervised pured diet allowing family to administer orals after adequate training -Mother at bedside and requesting SLP assist with training administering orals  History of urinary retention/recurrent Klebsiella UTI -Continue Cardura -Discussed with ID pharmacist.  Continue Rocephin for total of 7 days     Other problems: Right upper extremity thrombophlebitis - [place]-> age indeterminate SVT of right cephalic vein  Neck Contracture/torticollis -- Resolved  ESBL positive Enterobacter UTI -Has completed 5 days of meropenem -Contact isolation during hospitalization -continue Oxybutynin suspected bladder spasms  Klebsiella UTI -Resistant to nitrofurantoin and ampicillin -Meropenem transitioned to Rocephin on 5/22 -To minimize risk of recurrent UTI have asked that pure wick only be used at bedtime on a 10 PM to 8 AM schedule  Recurrent diarrhea -Resolved -Likely combination of recent antibiotics as well  as scheduled laxative -MiraLAX now as needed  Bacterial Conjunctivitis /periorbital cellulitis -Resolved  Chronic pansinusitis/recurrent fevers 2/2 aspiration PNA;History of Serratia Pneumonia  Hospital Acquired Pneumonia:  -Completed several courses of antibiotic -Follow-up imaging including sinus CT consistent with chronic but stable and resolving sinusitis   Anemia -Stable  HLD -Continue  Lipitor      Data Reviewed: Basic Metabolic Panel: Recent Labs  Lab 12/18/20 1014 12/19/20 0325  NA 139 140  K 3.7 3.9  CL 101 102  CO2 31 29  GLUCOSE 106* 110*  BUN 16 11  CREATININE 0.49 0.44  CALCIUM 9.5 9.4  MG 1.9  --    Liver Function Tests: Recent Labs  Lab 12/19/20 0325  AST 27  ALT 35  ALKPHOS 97  BILITOT 0.5  PROT 7.0  ALBUMIN 3.2*   CBC: Recent Labs  Lab 12/18/20 1014 12/19/20 0325  WBC 5.5 5.0  NEUTROABS 2.8 2.3  HGB 11.0* 10.9*  HCT 35.0* 34.4*  MCV 80.5 80.2  PLT 315 281    CBG: Recent Labs  Lab 12/23/20 1157 12/23/20 1650 12/23/20 1950 12/24/20 0002 12/24/20 0429  GLUCAP 122* 128* 118* 169* 101*     Studies: CT KNEE RIGHT WO CONTRAST  Result Date: 12/23/2020 CLINICAL DATA:  acute on chronic pain-h/o remote MCL tear EXAM: CT OF THE right KNEE WITHOUT CONTRAST TECHNIQUE: Multidetector CT imaging of the right knee was performed according to the standard protocol. Multiplanar CT image reconstructions were also generated. COMPARISON:  None. FINDINGS: Bones/Joint/Cartilage There is no evidence of acute fracture. There is mild tricompartment degenerative changes. Small joint effusion. The knee is held in approximately 90 degrees flexion. Tiny suprapatellar enthesophyte. Ligaments Suboptimally assessed by CT. Muscles and Tendons No muscle atrophy.  No obvious tendon pathology on noncontrast CT. Soft tissues No focal fluid collection. No evidence of soft tissue mass on noncontrast CT. Poorly visualized posterior horn of the lateral meniscus. IMPRESSION: No evidence of acute fracture. Mild tricompartment osteoarthritis. Small joint effusion. Poorly visualized posterior horn of the lateral meniscus, possibly torn. Consider MRI as this would be the test of choice to evaluate for internal derangement of the knee. Electronically Signed   By: Maurine Simmering   On: 12/23/2020 13:41     Scheduled Meds:  amLODipine  10 mg Per Tube Daily   atorvastatin  40 mg  Per Tube Daily   baclofen  10 mg Per Tube 3 times per day   chlorhexidine gluconate (MEDLINE KIT)  15 mL Mouth Rinse BID   Chlorhexidine Gluconate Cloth  6 each Topical Daily   diclofenac Sodium  4 g Topical QID   doxazosin  2 mg Per Tube Daily   feeding supplement (PROSource TF)  45 mL Per Tube Daily   free water  200 mL Per Tube Q4H   hydrALAZINE  50 mg Per Tube Q8H   insulin aspart  0-9 Units Subcutaneous Q4H   mouth rinse  15 mL Mouth Rinse 5 X Daily   metoprolol tartrate  100 mg Per Tube BID   modafinil  200 mg Per Tube Daily   polyethylene glycol  17 g Per Tube Daily   tiZANidine  4 mg Per Tube QHS   Continuous Infusions:  sodium chloride Stopped (10/26/20 1445)   cefTRIAXone (ROCEPHIN)  IV 1 g (12/23/20 1537)   feeding supplement (OSMOLITE 1.2 CAL) 55 mL/hr at 12/20/20 3149    Principal Problem:   ICH (intracerebral hemorrhage) ([place]) Active Problems:   Acute hypoxemic respiratory failure ([place])  Status post tracheostomy [facility])   Palliative care by specialist   Muscle hypertonicity   Oropharyngeal dysphagia   Poor prognosis   Aspiration pneumonia of right lower lobe ([place])   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   Acute cystitis without hematuria   Ileus, unspecified ([place])   Constipation   Consultants: Neurology  Neurosurgery.  Palliative care.   PCCM continues to follow.  No decannulation.   Procedures: Tracheostomy PEG tube EEG Echocardiogram  Antibiotics: Vancomycin 2/12-2/15 Maxipime 2/12-2/15 Ancef 2/19-2/27 Vancomycin 2/20 x 1 dose Maxipime 2/27- 3/5 Vancomycin 3/1-3/3 Ancef 3/6-3/14 Cipro 3/20 4-3/30 Unasyn 3/30-4/4 Rocephin 4/11-4/12 Meropenem 4/12-4/17   Time spent: 25 minutes    Erin Hearing ANP  [facility] 7 am - 330 pm/M-F for direct patient care and secure chat Please refer to [place] for contact info 119  Days

## 2020-12-24 NOTE — Progress Notes (Signed)
  Speech Language Pathology Treatment:    Patient Details Name: Charlene [name] MRN: [id number] DOB: 07-04-1966 Today's Date: 12/24/2020 Time: 4098-1191 SLP Time Calculation (min) (ACUTE ONLY): 28 min  Assessment / Plan / Recommendation Clinical Impression  Followed up for dysphagia intervention and cognitive linguistic intervention. Pt with good results form MBSS 6/3. No family at bedside currently. Nursing present, reinforced good results from swallow study and PO trials of puree, nectar thick, some thins with SLP with hope for future diet initiation. Pt very alert upright in bed. PMSV placed, tolerated well. Pt with oral acceptance of POs from SLP, puree via tsp, ice chips, tsp water, and nectar thick liquids. Pt exhibited good oral manipulation of POs with palpable swallows for all consistencies trialed. No overt s/sx of aspiration exhibited with any PO. Pt with some soft humming like phonation, remained clear vocal quality throughout trials. Recommend continue PMSV use during all wake hours as tolerated, remove for sleep. SLP will continue to follow.      HPI HPI: Pt is 55 y/o female presents to [facility] on 2/9 with L sided weakness and headaches. PMH includes anxiety, HTN. 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 S/p Right frontal ventricular catheter placement on 2/10, stopped functioning on 2/11.  MRI 2/11 shows right greater than left mesial temporal lobes / parahippocampal cortex, bilateral basal ganglia, and possibly the upper pons that are concerning for acute infarcts, most likely secondary to mass effect from the hemorrhage. ETT 2/10; trach/PEG 09/10/20.  Trach changed to #6 cuffless 3/11; #4 cuffless 5/4. Hospital course complicated by persistent fever,  Serratia pneumonia and pre-orbital cellulitis.      SLP Plan  Continue with current plan of  care       Recommendations  Diet recommendations: Other(comment);NPO (PO trials with SLP and trained caregivers) Liquids provided via: Cup Medication Administration: Via alternative means Compensations: Minimize environmental distractions      Patient may use Passy-Muir Speech Valve: During all waking hours (remove during sleep) PMSV Supervision: Intermittent         Oral Care Recommendations: Oral care QID Follow up Recommendations: Skilled Nursing facility SLP Visit Diagnosis: Dysphagia, oropharyngeal phase (R13.12) Plan: Continue with current plan of care       GO                Ardyth Gal MA, CCC-SLP Acute Rehabilitation Services   12/24/2020, 3:48 PM

## 2020-12-25 LAB — GLUCOSE, CAPILLARY
Glucose-Capillary: 109 mg/dL — ABNORMAL HIGH (ref 70–99)
Glucose-Capillary: 123 mg/dL — ABNORMAL HIGH (ref 70–99)
Glucose-Capillary: 132 mg/dL — ABNORMAL HIGH (ref 70–99)
Glucose-Capillary: 136 mg/dL — ABNORMAL HIGH (ref 70–99)
Glucose-Capillary: 151 mg/dL — ABNORMAL HIGH (ref 70–99)
Glucose-Capillary: 153 mg/dL — ABNORMAL HIGH (ref 70–99)
Glucose-Capillary: 174 mg/dL — ABNORMAL HIGH (ref 70–99)

## 2020-12-25 MED ORDER — NUTRISOURCE FIBER PO PACK
1.0000 | PACK | Freq: Two times a day (BID) | ORAL | Status: DC
  Administered 2020-12-25 – 2021-02-11 (×97): 1
  Filled 2020-12-25 (×99): qty 1

## 2020-12-25 MED ORDER — TRAMADOL HCL 50 MG PO TABS
25.0000 mg | ORAL_TABLET | Freq: Four times a day (QID) | ORAL | Status: DC
  Administered 2020-12-25 – 2021-01-29 (×139): 25 mg
  Filled 2020-12-25 (×140): qty 1

## 2020-12-25 MED ORDER — POLYETHYLENE GLYCOL 3350 17 G PO PACK
17.0000 g | PACK | Freq: Every day | ORAL | Status: DC | PRN
  Administered 2021-01-07 – 2021-02-12 (×4): 17 g
  Filled 2020-12-25 (×4): qty 1

## 2020-12-25 MED ORDER — OSMOLITE 1.2 CAL PO LIQD
1000.0000 mL | ORAL | Status: DC
  Administered 2020-12-25 – 2021-01-23 (×19): 1000 mL
  Filled 2020-12-25 (×21): qty 1000

## 2020-12-25 NOTE — Progress Notes (Signed)
[facility] PROGRESS NOTE  Charlene [name] UXY:[id number] DOB: 1965-08-02 DOA: 08/26/2020 PCP: Patient, [name] (Inactive)    October 08, 2020     10/16/2020                        10/16/2020     10/18/2020                        10/20/2020    Status: Remains inpatient appropriate because:Altered mental status, Unsafe d/c plan, IV treatments appropriate due to intensity of illness or inability to take PO and Inpatient level of care appropriate due to severity of illness   Dispo: The patient is from: Home              Anticipated d/c is to: SNF trach capable[place]-trach team re eval 6/3 and still rec NOT to decannulate 2/2 menattion              Patient currently is not medically stable to d/c.   Difficult to place patient Yes   Level of care: Telemetry Medical  Code Status: Full Family Communication: [name] [phone]) is her POA and legal contact.  [name] was at bedside on 5/18 and given update.  [name] at bedside 6/6 DVT prophylaxis: Chronically bedbound.  5/6: We will discontinue prophylactic Lovenox   Vaccination status: Unvaccinated-husband still undecided.  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  Subsequently on 2/10, she developed obtundation and respiratory arrest leading to intubation.  She has remained persistently obtunded, now has a trach and a PEG.  Hospital course complicated by  persistent fever, Serratia pneumonia and now pre-orbital cellulitis  As of 3/25 with adjustments in hypertonicity medications patient began to improve regarding hypertonicity.  Eventually started on provigil with improvements in alertness.  Evaluated by rehab physician who recommended increasing provigil dose further.  Subsequently patient has become more alert.  Most days she is able to track and with encouragement able to follow simple commands especially since partial obstruction glasses placed.  See progress note dictated 4/27 for expanded details.   2/09 Admit for [place] 2/10 EVD placed, later intubated due to obtundation and respiratory arrest 2/11 MRI Brain, hypertonic saline d/c'd, EVD not working.  Husband did not consent to PICC  2/13 EVD removed 2/17 [place] discussion with family, PMT 2/24 Bedside trach and PEG 3/1 Transferred to [facility]  Subjective: Very alert and remains focused on this writer when she is spoken to.  Moving upper extremities spontaneously with right greater than left which is in a splint.  Very slow to respond in regards to following commands but if given enough time she will follow commands.  Objective: Vitals:   12/25/20 0607 12/25/20 0726  BP: (!) 144/92   Pulse: 86 83  Resp: 16 14  Temp: 97.7 F (36.5 C)   SpO2: 100% 100%   No intake or output data  in the 24 hours ending 12/25/20 0747  Filed Weights   12/15/20 0400 12/17/20 0500 12/25/20 0500  Weight: 54.9 kg 55.2 kg 53.2 kg    Exam:  Constitutional: Alert, appears calm and comfortable Respiratory: #4.0 cuffless trach, trach collar with FiO2 21% at 5L, lungs remain clear.  No increased work of breathing.  No tracheal secretions. Cardiovascular: Normal heart sounds, normotensive, no tachycardia.  No edema. Abdomen:  PEG full tube feeding-LBM 6/9.  Abdomen soft and nontender with normoactive bowel sounds.  Nursing reporting increased frequency of loose but not watery stools Musculoskeletal: Bilateral  atrophy of thigh and calf muscles-continues to have some right knee pain that is worse with extension Neurologic: Alert, spontaneous movement of RUE-intermittantly purposeful. Subtle L facial droop, minimal movement LEs.  Psych: Alert, does track appropriately in room.  Remains nonverbal although at times she attempts to phonate with incomprehensible sounds that are consistent with humming.  Unable to accurately assess orientation   Assessment/Plan: Acute problems: Multiple acute strokes  Right Thalamus ICH with IVH  Obstructive Hydrocephalus/Acute on persistent metabolic Encephalopathy/brain injury related hypertonicity -Continue baclofen to 75m TID and HS Tizanadine to 4 mg HS for spasticity.  -Continue Provigil -Continue PT/OT/SLP; demonstrates purposeful actions during therapies including reaching for objects and attempting to feed self with assistance/assistive devices when spoon placed in right hand -Reevaluated by CIR on 5/2.  Based on that exam she had not made any meaningful functional gains and was deemed more appropriate for the SNF setting -6/9 continues to have positive but slow gains as documented by therapists            Colonic Ileus/recurrent constipation/loose stools -6/6 ileus resolved based on follow-up abdominal films -Continue Miralax prn -As of 6/10 nursing reports loose stools but not watery diarrhea.  We will add fiber supplementation to help with increasing bulk to stools  Bilateral knee osteoarthritis/reported ligamentous injury to right knee/right hip pain -Has completed a short course of prednisone, also treated with scheduled Tylenol and NSAIDs. -History of remote MCL tear. CT right knee on 6/9 with mild tricompartmental osteoarthritis with small joint effusion with findings confirming likely torn MCL -Continue scheduled Ultram noting she appears more comfortable today -Voltaren gel to knees and hip   Hypertensive emergency/Malignant HTN -Due to financial  constraints patient was not taking carvedilol and hydrochlorothiazide prior to admission -Continue Norvasc, Lopressor and hydralazine. -Also on Flomax for urinary retention   Acute hypoxemic respiratory failure secondary to CVA requiring mechanical ventilation (Resolved) Trach dependent -Downsized to #4 cuffless trach (5/8) -Despite tolerating capping trials trach team documents her baseline encephalopathy and poor cough mechanics preclude decannulation given extreme risk of airway compromise (death from aspiration).  They state she is not a candidate for decannulation and as of 5/30 they have signed off although recommended to call back if mental status improves  Dysphagia -Continue tube feedings, Prosource liquid, free water -Follow CBGs and provide SSI -Continue supervised pured diet allowing family to administer orals after adequate training -Mother at bedside and requesting SLP assist with training administering orals  History of urinary retention/recurrent Klebsiella UTI -Continue Cardura -Discussed with ID pharmacist.  Continue Rocephin for total of 7 days     Other problems: Right upper extremity thrombophlebitis - [place]-> age indeterminate SVT of right cephalic vein  Neck Contracture/torticollis -- Resolved  ESBL positive Enterobacter UTI -Has completed 5 days of meropenem -Contact isolation during hospitalization -continue Oxybutynin suspected bladder spasms  Klebsiella UTI -Resistant to nitrofurantoin and ampicillin -Meropenem transitioned  to Rocephin on 5/22 -To minimize risk of recurrent UTI have asked that pure wick only be used at bedtime on a 10 PM to 8 AM schedule  Recurrent diarrhea -Resolved -Likely combination of recent antibiotics as well as scheduled laxative -MiraLAX now as needed  Bacterial Conjunctivitis /periorbital cellulitis -Resolved  Chronic pansinusitis/recurrent fevers 2/2 aspiration PNA;History of Serratia Pneumonia  Hospital Acquired  Pneumonia:  -Completed several courses of antibiotic -Follow-up imaging including sinus CT consistent with chronic but stable and resolving sinusitis   Anemia -Stable  HLD -Continue Lipitor      Data Reviewed: Basic Metabolic Panel: Recent Labs  Lab 12/18/20 1014 12/19/20 0325  NA 139 140  K 3.7 3.9  CL 101 102  CO2 31 29  GLUCOSE 106* 110*  BUN 16 11  CREATININE 0.49 0.44  CALCIUM 9.5 9.4  MG 1.9  --    Liver Function Tests: Recent Labs  Lab 12/19/20 0325  AST 27  ALT 35  ALKPHOS 97  BILITOT 0.5  PROT 7.0  ALBUMIN 3.2*   CBC: Recent Labs  Lab 12/18/20 1014 12/19/20 0325  WBC 5.5 5.0  NEUTROABS 2.8 2.3  HGB 11.0* 10.9*  HCT 35.0* 34.4*  MCV 80.5 80.2  PLT 315 281    CBG: Recent Labs  Lab 12/24/20 1244 12/24/20 1705 12/24/20 2029 12/25/20 0038 12/25/20 0453  GLUCAP 133* 122* 110* 153* 136*     Studies: CT KNEE RIGHT WO CONTRAST  Result Date: 12/23/2020 CLINICAL DATA:  acute on chronic pain-h/o remote MCL tear EXAM: CT OF THE right KNEE WITHOUT CONTRAST TECHNIQUE: Multidetector CT imaging of the right knee was performed according to the standard protocol. Multiplanar CT image reconstructions were also generated. COMPARISON:  None. FINDINGS: Bones/Joint/Cartilage There is no evidence of acute fracture. There is mild tricompartment degenerative changes. Small joint effusion. The knee is held in approximately 90 degrees flexion. Tiny suprapatellar enthesophyte. Ligaments Suboptimally assessed by CT. Muscles and Tendons No muscle atrophy.  No obvious tendon pathology on noncontrast CT. Soft tissues No focal fluid collection. No evidence of soft tissue mass on noncontrast CT. Poorly visualized posterior horn of the lateral meniscus. IMPRESSION: No evidence of acute fracture. Mild tricompartment osteoarthritis. Small joint effusion. Poorly visualized posterior horn of the lateral meniscus, possibly torn. Consider MRI as this would be the test of choice to  evaluate for internal derangement of the knee. Electronically Signed   By: Maurine Simmering   On: 12/23/2020 13:41     Scheduled Meds:  amLODipine  10 mg Per Tube Daily   atorvastatin  40 mg Per Tube Daily   baclofen  10 mg Per Tube 3 times per day   chlorhexidine gluconate (MEDLINE KIT)  15 mL Mouth Rinse BID   Chlorhexidine Gluconate Cloth  6 each Topical Daily   diclofenac Sodium  4 g Topical QID   doxazosin  2 mg Per Tube Daily   feeding supplement (PROSource TF)  45 mL Per Tube Daily   free water  200 mL Per Tube Q4H   hydrALAZINE  50 mg Per Tube Q8H   insulin aspart  0-9 Units Subcutaneous Q4H   mouth rinse  15 mL Mouth Rinse 5 X Daily   metoprolol tartrate  100 mg Per Tube BID   modafinil  200 mg Per Tube Daily   tiZANidine  4 mg Per Tube QHS   traMADol  25 mg Oral Q6H   Continuous Infusions:  sodium chloride Stopped (10/26/20 1445)   cefTRIAXone (ROCEPHIN)  IV 1 g (12/24/20 1516)   feeding supplement (OSMOLITE 1.2 CAL) 1,000 mL (12/25/20 0100)    Principal Problem:   ICH (intracerebral hemorrhage) (HCC) Active Problems:   Acute hypoxemic respiratory failure (HCC)   Tracheostomy dependent (HCC)   Palliative care by specialist   Muscle hypertonicity   Oropharyngeal dysphagia   Poor prognosis   Aspiration pneumonia of right lower lobe ([place])   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   Acute cystitis without hematuria   Ileus, unspecified ([place])   Constipation   Consultants: Neurology  Neurosurgery.  Palliative care.   PCCM continues to follow.  No decannulation.   Procedures: Tracheostomy PEG tube EEG Echocardiogram  Antibiotics: Vancomycin 2/12-2/15 Maxipime 2/12-2/15 Ancef 2/19-2/27 Vancomycin 2/20 x 1 dose Maxipime 2/27- 3/5 Vancomycin 3/1-3/3 Ancef 3/6-3/14 Cipro 3/20 4-3/30 Unasyn 3/30-4/4 Rocephin 4/11-4/12 Meropenem 4/12-4/17   Time spent: 25 minutes    Erin Hearing ANP  [facility] 7 am - 330 pm/M-F for direct patient care and secure chat Please refer to [place] for contact info 120  Days

## 2020-12-26 LAB — GLUCOSE, CAPILLARY
Glucose-Capillary: 144 mg/dL — ABNORMAL HIGH (ref 70–99)
Glucose-Capillary: 117 mg/dL — ABNORMAL HIGH (ref 70–99)
Glucose-Capillary: 102 mg/dL — ABNORMAL HIGH (ref 70–99)
Glucose-Capillary: 105 mg/dL — ABNORMAL HIGH (ref 70–99)
Glucose-Capillary: 114 mg/dL — ABNORMAL HIGH (ref 70–99)

## 2020-12-26 NOTE — Plan of Care (Signed)
  Problem: Intracerebral Hemorrhage Tissue Perfusion: Goal: Complications of Intracerebral Hemorrhage will be minimized Outcome: Progressing   Problem: Intracerebral Hemorrhage Tissue Perfusion: Goal: Complications of Intracerebral Hemorrhage will be minimized Outcome: Progressing   Problem: Ischemic Stroke/TIA Tissue Perfusion: Goal: Complications of ischemic stroke/TIA will be minimized Outcome: Progressing

## 2020-12-26 NOTE — Progress Notes (Signed)
[facility] PROGRESS NOTE  Charlene [name] IRW:[id number] DOB: [birth year] DOA: 08/26/2020 PCP: Patient, [name] (Inactive)   HPI/brief narrative: 55 yo F with hypertensive R thalamic/basal ganglia ICH with resultant hydrocephalus prompting external ventricular drain placement on 08/27/20.  Subsequently on 2/10, she developed obtundation and respiratory arrest leading to intubation.  She has remained persistently obtunded, received trach and a PEG.  Hospital course complicated by persistent fever, Serratia pneumonia and pre-orbital cellulitis.  PCCM following.  Subjective: Today, patient was seen and examined at bedside.  Awake but no other interval changes.  Responds some  Objective: Vitals:   12/26/20 1142 12/26/20 1255  BP: 120/77 124/75  Pulse: 77 76  Resp: 13 12  Temp: 98 F (36.7 C)   SpO2: 99% 96%    Intake/Output Summary (Last 24 hours) at 12/26/2020 1434 Last data filed at 12/26/2020 0326 Gross per 24 hour  Intake --  Output 250 ml  Net -250 ml    Filed Weights   12/15/20 0400 12/17/20 0500 12/25/20 0500  Weight: 54.9 kg 55.2 kg 53.2 kg    Physical exam: General:  Average built, not in obvious distress,, alert awake and tracks with her eyes. HENT:   No scleral pallor or icterus noted. Oral mucosa is moist.  Tracheostomy tube in place.  Chest:  Clear breath sounds.  Diminished breath sounds bilaterally. No crackles or wheezes.  CVS: S1 &S2 heard. No murmur.  Regular rate and rhythm. Abdomen: Soft, nontender, nondistended.  Bowel sounds are heard.  PEG tube in place. Extremities: No cyanosis, clubbing or edema.  Peripheral pulses are palpable.  Atrophy of the thigh and calf muscles. Psych: Alert, awake  CNS: Left facial droop spasticity and apraxia .  Left-sided facial droop with subtle.  Tracks people.  Squeezed hands.  Nonverbal.  Incomprehensible sounds at times. Skin: Warm and dry.  No rashes noted.   Assessment/Plan:  Multiple acute strokes  Right  Thalamus ICH with IVH  Obstructive Hydrocephalus/Acute on persistent metabolic Encephalopathy/brain injury related hypertonicity  Continue baclofen and tizanidine. continue PT, OT speech-language therapy.  Currently, considering skilled nursing level of care.  Colonic ileus.  On MiraLAX.  Free water flushes.  Stable.  Having loose bowel movements.  History of urinary retention on doxazosin 2 mg.    Hypertensive emergency/Malignant HTN Patient was supposed to be on Coreg and HCTZ prior to admission but was not taking it.  Continue Norvasc, Lopressor and hydralazine.  Doxazosin has been added   Acute hypoxemic respiratory failure secondary to CVA requiring mechanical ventilation/Trach dependent Management as per rectum.  Bilateral knee osteoarthritis.  Continue supportive care.  Continue Tylenol and NSAIDs.  Dysphagia Continue tube feedings, Prosource liquid, free water.  Continue sliding scale insulin, Accu-Cheks.  Patient underwent modified barium swallow on 12/18/2020 with recommendation for dysphagia 1 solids.  But currently n.p.o. at this time with occasional bites of applesauce pudding and yogurt  Right upper extremity thrombophlebitis Symptomatic care.  Neck Contracture/torticollis Continue antispasmodic.   Bacterial Conjunctivitis /periorbital cellulitis Resolved  Chronic pansinusitis/recurrent fevers 2/2 aspiration PNA;History of Serratia Pneumonia  Hospital Acquired Pneumonia/history of UTI.:  Completed several courses of antibiotics. Follow-up imaging including sinus CT consistent with chronic but stable and resolving sinusitis.  Completed IV Rocephin.   Anemia Stable   Hyperlipidemia Continue Lipitor  Status: Inpatient Remains inpatient appropriate because:Altered mental status, Unsafe d/c plan, IV treatments appropriate due to intensity of illness or inability to take PO and Inpatient level of care appropriate due to severity  of illness  Dispo: The patient is from:  Home              Anticipated d/c is to: SNF trach capable-[place] but no beds available              Patient currently is medically stable to d/c.   Difficult to place patient Yes  Code Status: Full code  Family Communication: None  DVT prophylaxis: Off Lovenox  Data Reviewed: Basic Metabolic Panel: No results for input(s): NA, K, CL, CO2, GLUCOSE, BUN, CREATININE, CALCIUM, MG, PHOS in the last 168 hours.  Liver Function Tests: No results for input(s): AST, ALT, ALKPHOS, BILITOT, PROT, ALBUMIN in the last 168 hours.  CBC: No results for input(s): WBC, NEUTROABS, HGB, HCT, MCV, PLT in the last 168 hours.   CBG: Recent Labs  Lab 12/25/20 1936 12/25/20 2331 12/26/20 0310 12/26/20 0840 12/26/20 1149  GLUCAP 123* 174* 144* 117* 102*      Studies: No results found.   Scheduled Meds:  amLODipine  10 mg Per Tube Daily   atorvastatin  40 mg Per Tube Daily   baclofen  10 mg Per Tube 3 times per day   chlorhexidine gluconate (MEDLINE KIT)  15 mL Mouth Rinse BID   Chlorhexidine Gluconate Cloth  6 each Topical Daily   diclofenac Sodium  4 g Topical QID   doxazosin  2 mg Per Tube Daily   feeding supplement (PROSource TF)  45 mL Per Tube Daily   fiber  1 packet Per Tube BID   free water  200 mL Per Tube Q4H   hydrALAZINE  50 mg Per Tube Q8H   insulin aspart  0-9 Units Subcutaneous Q4H   mouth rinse  15 mL Mouth Rinse 5 X Daily   metoprolol tartrate  100 mg Per Tube BID   modafinil  200 mg Per Tube Daily   tiZANidine  4 mg Per Tube QHS   traMADol  25 mg Per Tube Q6H   Continuous Infusions:  sodium chloride Stopped (10/26/20 1445)   feeding supplement (OSMOLITE 1.2 CAL) 1,000 mL (12/26/20 0058)    Principal Problem:   ICH (intracerebral hemorrhage) (HCC) Active Problems:   Acute hypoxemic respiratory failure (HCC)   Tracheostomy dependent ([place])   Palliative care by specialist   Muscle hypertonicity   Oropharyngeal dysphagia   Poor  prognosis   Aspiration pneumonia of right lower lobe (HCC)   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   Acute cystitis without hematuria   Ileus, unspecified ([place])   Constipation   Consultants: Neurology  Neurosurgery.  Palliative care.   PCCM continues to follow.  No decannulation.   Procedures: Tracheostomy PEG tube EEG Echocardiogram  Antibiotics: Vancomycin 2/12-2/15 Maxipime 2/12-2/15 Ancef 2/19-2/27 Vancomycin 2/20 x 1 dose Maxipime 2/27- 3/5 Vancomycin 3/1-3/3 Ancef 3/6-3/14 Cipro 3/20 4-3/30 Unasyn 3/30-4/4 Rocephin 4/11-4/12 Meropenem 4/12-4/17  Micro Data:  2/9 COVID + Influenza negative 2/10 MRSA PCR negative 2/12 BCx 2 > no growth  2/19 BC 1/2 +Stap EPI, 1/2 no growth  10/07/2020 sputum positive for Serratia marcescens 4/8 Enterobacter cloaca + urine culture MDR sensitive only to imipenem and gentamicin   Antimicrobials:  Cefepime 2/12 > 2/15 Vanc 2/12 > 2/15 10/08/2020 ciprofloxacin>>3/30 Unasyn 3/30 >> 4/02 Rocephin 4/11 > 4/12 Meropenem 4/12>4/17   Flora Lipps, MD [facility] 121  Days

## 2020-12-27 LAB — GLUCOSE, CAPILLARY
Glucose-Capillary: 101 mg/dL — ABNORMAL HIGH (ref 70–99)
Glucose-Capillary: 120 mg/dL — ABNORMAL HIGH (ref 70–99)
Glucose-Capillary: 135 mg/dL — ABNORMAL HIGH (ref 70–99)
Glucose-Capillary: 138 mg/dL — ABNORMAL HIGH (ref 70–99)
Glucose-Capillary: 148 mg/dL — ABNORMAL HIGH (ref 70–99)
Glucose-Capillary: 96 mg/dL (ref 70–99)

## 2020-12-27 NOTE — Progress Notes (Signed)
PROGRESS NOTE    Charlene [name]  PNP:[id number] DOB: 06/10/1966 DOA: 08/26/2020 PCP: Patient, [name] (Inactive) (Confirm with patient/family/NH records and if not entered, this HAS to be entered at [facility] point of entry. "No PCP" if truly none.)   Chief Complaint  Patient presents with   Code Stroke    Brief Narrative:  55 yo F with hypertensive R thalamic/basal ganglia ICH with resultant hydrocephalus prompting external ventricular drain placement on 08/27/20.  Subsequently on 2/10, she developed obtundation and respiratory arrest leading to intubation.  She has remained persistently obtunded, received trach and a PEG.  Hospital course complicated by persistent fever, Serratia pneumonia and pre-orbital cellulitis.  Pt seen and examined at bedside. She appears comfortable, remains on trach on oxygen. Son at bedside, reports that she responds to him. She is tracking occasionally.   Assessment & Plan:   Principal Problem:   ICH (intracerebral hemorrhage) (HCC) Active Problems:   Acute hypoxemic respiratory failure (HCC)   Tracheostomy dependent (HCC)   Palliative care by specialist   Muscle hypertonicity   Oropharyngeal dysphagia   Poor prognosis   Aspiration pneumonia of right lower lobe (HCC)   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   Acute cystitis without hematuria   Ileus, unspecified (HCC)   Constipation   Acute stroke/ right thalamus ICH with IVH/Obstructive hydrocephalus/ Brain injury Continue with baclofen and tizanimide.  Therapy evaluations recommending SNF.    Acute hypoxemic respiratory failure sec to CVA requiring mechanical ventilation S/p trach dependent Management as per PCCM.  Pulmonary hygiene.  Wean oxygen as tolerated.     Dysphagia:  ON TUBE feeds.  Continue with SLP follow up .     Torticollis:  Continue with baclofen.    Chronic ileus:  On miralax.    Anemia of chronic  disease:  Transfuse to keep hemoglobin greater than 7.    Hyperlipidemia: Resume lipitor.      Malignant Hypertension;  Improved BP parameters.  Resume home meds.    DVT prophylaxis: (scd's Code Status: (Full code) Family Communication: son at bedside.  Disposition:   Status is: Inpatient  Remains inpatient appropriate because:Unsafe d/c plan  Dispo: The patient is from: Home              Anticipated d/c is to:  pending.               Patient currently is not medically stable to d/c.   Difficult to place patient No       Consultants:  PCCM.   Procedures:   Antimicrobials:  Antibiotics Given (last 72 hours)     Date/Time Action Medication Dose Rate   12/25/20 1641 New Bag/Given   cefTRIAXone (ROCEPHIN) 1 g in sodium chloride 0.9 % 100 mL IVPB 1 g 200 mL/hr         Subjective: Appears comfortable.   Objective: Vitals:   12/27/20 0903 12/27/20 1139 12/27/20 1502 12/27/20 1641  BP: 125/68 125/68 123/64   Pulse: 72 73 73 70  Resp: '12 12 17 17  ' Temp:   98.7 F (37.1 C)   TempSrc:   Axillary   SpO2: 98% 98% 97% 96%  Weight:      Height:        Intake/Output Summary (Last 24 hours) at 12/27/2020 1737 Last data filed at 12/27/2020 0800 Gross per 24 hour  Intake 1260 ml  Output 600 ml  Net 660 ml   [employer]  12/17/20 0500 12/25/20 0500 12/27/20 0400  Weight: 55.2 kg 53.2 kg 53.5 kg    Examination:  General exam: Appears calm and comfortable  Respiratory system: Clear to auscultation. Respiratory effort normal. S/p trach on 6 lit of oxygen.  Cardiovascular system: S1 & S2 heard, RRR. Marland Kitchen No pedal edema. Gastrointestinal system: Abdomen is nondistended, soft and nontender. Normal bowel sounds heard. Central nervous system: alert, non verbal.  Extremities: no edema.  Skin: No rashes, lesions or ulcers Psychiatry: cannot be assessed.      Data Reviewed: I have personally reviewed following labs and imaging studies  CBC: No results for  input(s): WBC, NEUTROABS, HGB, HCT, MCV, PLT in the last 168 hours.  Basic Metabolic Panel: No results for input(s): NA, K, CL, CO2, GLUCOSE, BUN, CREATININE, CALCIUM, MG, PHOS in the last 168 hours.  GFR: Estimated Creatinine Clearance: 68.7 mL/min (by C-G formula based on SCr of 0.44 mg/dL).  Liver Function Tests: No results for input(s): AST, ALT, ALKPHOS, BILITOT, PROT, ALBUMIN in the last 168 hours.  CBG: Recent Labs  Lab 12/27/20 0028 12/27/20 0445 12/27/20 0746 12/27/20 1212 12/27/20 1502  GLUCAP 148* 101* 138* 120* 96     Recent Results (from the past 240 hour(s))  Culture, Urine     Status: Abnormal   Collection Time: 12/17/20  5:54 PM   Specimen: Urine, Clean Catch  Result Value Ref Range Status   Specimen Description URINE, CLEAN CATCH  Final   Special Requests   Final    NONE Performed at [facility], [place] [address], [place], [place] [address]    Culture 50,000 COLONIES/mL KLEBSIELLA PNEUMONIAE (A)  Final   Report Status 12/19/2020 FINAL  Final   Organism ID, Bacteria KLEBSIELLA PNEUMONIAE (A)  Final      Susceptibility   Klebsiella pneumoniae - MIC*    AMPICILLIN >=32 RESISTANT Resistant     CEFAZOLIN 32 INTERMEDIATE Intermediate     CEFEPIME 0.25 SENSITIVE Sensitive     CEFTRIAXONE 1 SENSITIVE Sensitive     CIPROFLOXACIN 0.5 SENSITIVE Sensitive     GENTAMICIN <=1 SENSITIVE Sensitive     IMIPENEM <=0.25 SENSITIVE Sensitive     NITROFURANTOIN 128 RESISTANT Resistant     TRIMETH/SULFA <=20 SENSITIVE Sensitive     AMPICILLIN/SULBACTAM 8 SENSITIVE Sensitive     PIP/TAZO 8 SENSITIVE Sensitive     * 50,000 COLONIES/mL KLEBSIELLA PNEUMONIAE         Radiology Studies: No results found.      Scheduled Meds:  amLODipine  10 mg Per Tube Daily   atorvastatin  40 mg Per Tube Daily   baclofen  10 mg Per Tube 3 times per day   chlorhexidine gluconate (MEDLINE KIT)  15 mL Mouth Rinse BID   Chlorhexidine Gluconate Cloth  6 each Topical Daily    diclofenac Sodium  4 g Topical QID   doxazosin  2 mg Per Tube Daily   feeding supplement (PROSource TF)  45 mL Per Tube Daily   fiber  1 packet Per Tube BID   free water  200 mL Per Tube Q4H   hydrALAZINE  50 mg Per Tube Q8H   insulin aspart  0-9 Units Subcutaneous Q4H   mouth rinse  15 mL Mouth Rinse 5 X Daily   metoprolol tartrate  100 mg Per Tube BID   modafinil  200 mg Per Tube Daily   tiZANidine  4 mg Per Tube QHS   traMADol  25 mg Per Tube Q6H  Continuous Infusions:  sodium chloride Stopped (10/26/20 1445)   feeding supplement (OSMOLITE 1.2 CAL) 60 mL/hr at 12/27/20 0400     LOS: 122 days        Hosie Poisson, MD [facility]   To contact the attending provider between 7A-7P or the covering provider during after hours 7P-7A, please log into the web site [email or web address] and access using universal [place] password for that web site. If you do not have the password, please call the hospital operator.  12/27/2020, 5:37 PM

## 2020-12-28 LAB — GLUCOSE, CAPILLARY
Glucose-Capillary: 100 mg/dL — ABNORMAL HIGH (ref 70–99)
Glucose-Capillary: 103 mg/dL — ABNORMAL HIGH (ref 70–99)
Glucose-Capillary: 110 mg/dL — ABNORMAL HIGH (ref 70–99)
Glucose-Capillary: 128 mg/dL — ABNORMAL HIGH (ref 70–99)
Glucose-Capillary: 155 mg/dL — ABNORMAL HIGH (ref 70–99)
Glucose-Capillary: 156 mg/dL — ABNORMAL HIGH (ref 70–99)
Glucose-Capillary: 201 mg/dL — ABNORMAL HIGH (ref 70–99)

## 2020-12-28 NOTE — Plan of Care (Signed)
  Problem: Elimination: Goal: Will not experience complications related to bowel motility Outcome: Progressing Goal: Will not experience complications related to urinary retention Outcome: Progressing   Problem: Pain Managment: Goal: General experience of comfort will improve Outcome: Progressing   Problem: Skin Integrity: Goal: Risk for impaired skin integrity will decrease Outcome: Progressing   

## 2020-12-28 NOTE — Progress Notes (Signed)
[facility] PROGRESS NOTE  Charlene FORGUE XYV:[id number] DOB: [birth year] DOA: 08/26/2020 PCP: [name], [name] (Inactive)    October 08, 2020     10/16/2020                        10/16/2020     10/18/2020                        10/20/2020    Status: Remains inpatient appropriate because:Altered mental status and Unsafe d/c plan: Requirement to discharge to trach capable SNF   Dispo: [facility] [name] is from: Home              Anticipated d/c is to: SNF trach capable-family prefers facility in [facility] but no beds available-trach team re eval 6/3 and still rec NOT to decannulate 2/2 menattion              [name] currently is not medically stable to d/c.   Difficult to place [name] Yes   Level of care: Telemetry Medical  Code Status: Full Family Communication: husband [facility] [name] [phone]) is her POA and legal contact.  [facility] [name] was at bedside on 5/18 and given update.  Charlene [name] at bedside 6/6 DVT prophylaxis: Chronically bedbound.  5/6: We will discontinue prophylactic Lovenox   Vaccination status: Unvaccinated-husband still undecided.  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  Subsequently on 2/10, she developed obtundation and respiratory arrest leading to intubation.  She has remained persistently obtunded, now has a trach and a PEG.  Hospital course complicated by persistent fever, Serratia pneumonia and now pre-orbital cellulitis  As of 3/25 with adjustments in  hypertonicity medications [name] began to improve regarding hypertonicity.  Eventually started on provigil with improvements in alertness.  Evaluated by rehab physician who recommended increasing provigil dose further.  Subsequently [name] has become more alert.  Most days she is able to track and with encouragement able to follow simple commands especially since partial obstruction glasses placed.  See progress note dictated 4/27 for expanded details.   2/09 Admit for [name] 2/10 EVD placed, later intubated due to obtundation and respiratory arrest 2/11 MRI Brain, hypertonic saline d/c'd, EVD not working.  Husband did not consent to PICC  2/13 EVD removed 2/17 [place] discussion with family, PMT 2/24 Bedside trach and PEG 3/1 Transferred to [name]  Subjective: Continues to make eye contact and follow persons in [facility] room.  Was attempting to phonate but was unsuccessful but did make some mumbling and moaning noises.  Mother concerned over skin changes over forehead and neck which appear consistent with preacne type lesions.  Mother will bring in dosing wipes to help improve skin status.  Objective: Vitals:   12/28/20 0412 12/28/20 0759  BP: (!) 145/80   Pulse: 73 78  Resp: 20 16  Temp: 98.6 F (37 C)   SpO2: 100% 97%   No intake or output data in [facility] 24 hours ending 12/28/20 0807  Filed Weights   12/25/20 0500 12/27/20 0400 12/28/20 0441  Weight: 53.2 kg 53.5 kg 53.4 kg    Exam:  Constitutional: No acute distress, alert Respiratory: #4.0 cuffless trach, trach collar with FiO2 21% at 5L, lungs remain clear.  No cough detected.  Normal work of breathing.  Durations between 97 and 100% Cardiovascular: Normotensive, no peripheral edema.  Pulse is regular. Abdomen:  PEG for tube feeding-LBM 6/11.  Soft nontender nondistended Musculoskeletal: Bilateral atrophy of thigh and calf muscles-continues to have some right knee pain that is worse with extension Neurologic: Eyes are open.  Improved  visual tracking both eyes with less disconjugate gaze to left eye and also using left eye to track.  Has purposeful gross motor movement of right upper extremity.  Decreased movement and left legs and primarily flexes knees to chest for comfort reasons.  Mains nonverbal although does make attempts to verbalize but has been unsuccessful Psych: Nonverbal state unable to adequately assess orientation   Assessment/Plan: Acute problems: Multiple acute strokes  Right Thalamus ICH with IVH  Obstructive Hydrocephalus/Acute on persistent metabolic Encephalopathy/brain injury related hypertonicity -Continue baclofen and tizanidine for spasticity.  -Continue Provigil -Continue PT/OT/SLP; continues to make small gains daily -Reevaluated by CIR on 5/2.  Based on that exam she had not made any meaningful functional gains and was deemed more appropriate for [facility] setting        Colonic Ileus/recurrent constipation/loose stools -6/6 ileus resolved  -Continue Miralax prn -Continue fiber supplementation  Bilateral knee osteoarthritis/reported ligamentous injury to right knee/right hip pain -Has completed a short course of prednisone, also treated with scheduled Tylenol and NSAIDs. -History of remote MCL tear. CT right knee on 6/9 with mild tricompartmental osteoarthritis with small joint effusion with findings confirming likely torn MCL -Continue scheduled Ultram noting she appears more comfortable today -Voltaren gel to knees and hip  Hypertensive emergency/Malignant HTN -Due to financial constraints [name] was not taking carvedilol and hydrochlorothiazide prior to admission -Continue Norvasc, Lopressor and hydralazine. -Cardura for urinary retention   Acute hypoxemic respiratory failure secondary to CVA requiring mechanical ventilation (Resolved) Trach dependent -Downsized to #4 cuffless trach (5/8) -Despite tolerating capping trials trach team documents her baseline encephalopathy and poor cough  mechanics preclude decannulation given extreme risk of airway compromise (death from aspiration).  They state she is not a candidate for decannulation and as of 5/30 they have signed off although recommended to call back if mental status improves  Dysphagia -Continue tube feedings, Prosource liquid, free water -Follow CBGs and provide SSI -Continue supervised pured diet allowing family to administer orals after adequate training -Mother at bedside and requesting SLP assist with training administering orals  History of urinary retention/recurrent Klebsiella UTI -Continue Cardura -Discussed with ID pharmacist.  Continue Rocephin for total of 7 days     Other problems: Right upper extremity thrombophlebitis - [name] -> age indeterminate SVT of right cephalic vein  Neck Contracture/torticollis -- Resolved  ESBL positive Enterobacter UTI -Has completed 5 days of meropenem -Contact isolation during hospitalization -continue Oxybutynin suspected bladder spasms  Klebsiella UTI -Resistant to nitrofurantoin and ampicillin -Meropenem transitioned to Rocephin on 5/22 -To minimize risk of recurrent UTI have asked that pure wick only be used at bedtime on a 10 PM to 8 AM schedule  Recurrent diarrhea -Resolved -Likely combination of recent antibiotics as well as scheduled laxative -MiraLAX now as needed  Bacterial Conjunctivitis /periorbital cellulitis -Resolved  Chronic pansinusitis/recurrent fevers 2/2 aspiration PNA;History of Serratia Pneumonia  Hospital Acquired Pneumonia:  -  Completed several courses of antibiotic -Follow-up imaging including sinus CT consistent with chronic but stable and resolving sinusitis   Anemia -Stable  HLD -Continue Lipitor      Data Reviewed: Basic Metabolic Panel: No results for input(s): NA, K, CL, CO2, GLUCOSE, BUN, CREATININE, CALCIUM, MG, PHOS in [facility] last 168 hours.  Liver Function Tests: No results for input(s): AST, ALT, ALKPHOS,  BILITOT, PROT, ALBUMIN in [facility] last 168 hours.  CBC: No results for input(s): WBC, NEUTROABS, HGB, HCT, MCV, PLT in [facility] last 168 hours.   CBG: Recent Labs  Lab 12/27/20 1212 12/27/20 1502 12/27/20 1959 12/28/20 0004 12/28/20 0431  GLUCAP 120* 96 135* 156* 155*     Studies: No results found.   Scheduled Meds:  amLODipine  10 mg Per Tube Daily   atorvastatin  40 mg Per Tube Daily   baclofen  10 mg Per Tube 3 times per day   chlorhexidine gluconate (MEDLINE KIT)  15 mL Mouth Rinse BID   Chlorhexidine Gluconate Cloth  6 each Topical Daily   diclofenac Sodium  4 g Topical QID   doxazosin  2 mg Per Tube Daily   feeding supplement (PROSource TF)  45 mL Per Tube Daily   fiber  1 packet Per Tube BID   free water  200 mL Per Tube Q4H   hydrALAZINE  50 mg Per Tube Q8H   insulin aspart  0-9 Units Subcutaneous Q4H   mouth rinse  15 mL Mouth Rinse 5 X Daily   metoprolol tartrate  100 mg Per Tube BID   modafinil  200 mg Per Tube Daily   tiZANidine  4 mg Per Tube QHS   traMADol  25 mg Per Tube Q6H   Continuous Infusions:  sodium chloride Stopped (10/26/20 1445)   feeding supplement (OSMOLITE 1.2 CAL) 60 mL/hr at 12/27/20 0400    Principal Problem:   ICH (intracerebral hemorrhage) ([place]) Active Problems:   Acute hypoxemic respiratory failure (HCC)   Tracheostomy dependent ([place])   Palliative care by specialist   Muscle hypertonicity   Oropharyngeal dysphagia   Poor prognosis   Aspiration pneumonia of right lower lobe ([place])   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   Acute cystitis without hematuria   Ileus, unspecified ([place])   Constipation   Consultants: Neurology  Neurosurgery.  Palliative care.   PCCM continues to follow.  No decannulation.   Procedures: Tracheostomy PEG tube EEG Echocardiogram  Antibiotics: Vancomycin 2/12-2/15 Maxipime 2/12-2/15 Ancef 2/19-2/27 Vancomycin 2/20 x 1 dose Maxipime  2/27- 3/5 Vancomycin 3/1-3/3 Ancef 3/6-3/14 Cipro 3/20 4-3/30 Unasyn 3/30-4/4 Rocephin 4/11-4/12 Meropenem 4/12-4/17   Time spent: 25 minutes    Erin Hearing ANP  [facility] 7 am - 330 pm/M-F for direct [name] care and secure chat Please refer to [place] for contact info 123  Days

## 2020-12-28 NOTE — Progress Notes (Signed)
Physical Therapy Treatment Patient Details Name: Charlene [name] MRN: [id number] DOB: 07/19/1965 Today's Date: 12/28/2020    History of Present Illness Pt is 55 y/o female presents to [facility] with L sided weakness and headaches. 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 s/p Right frontal ventricular catheter placement on 2/10, stopped functioning on 2/11.  MRI 2/11 shows right greater than left mesial temporal lobes / parahippocampal cortex, bilateral basal ganglia, and possibly the upper pons that are concerning for acute infarcts, most likely secondary to mass effect from the hemorrhage. ETT 2/10-2/24.  Pt with trach and PEG on 09/10/20. Hospital course complicated by persistent fever, Serratia pneumonia and now pre-orbital cellulitis.   PMH includes anxiety, HTN.    PT Comments    Pt progressing well towards physical therapy goals. She tolerated tilt up to 65 this session and participated in activity while in tilt. Pt followed commands to take the object therapist was holding to her right, and hand it to the tech who was on her left. Pt completed this x2. R knee flexion does not appear to be improving, and more flexion is noted with each attempt at tilting. Recommend continuing soft brace to encourage extension. Following end of session, pt with eyes closed and appeared to be sleeping - overall good rehab effort. Will continue to follow.    Follow Up Recommendations  SNF     Equipment Recommendations  Hospital bed;Wheelchair cushion (measurements PT);Wheelchair (measurements PT);Other (comment) (18x18 tilt in space, hoyer lift)    Recommendations for Other Services Rehab consult     Precautions / Restrictions Precautions Precautions: Fall Precaution Comments: peg and trach Required Braces or Orthoses: Other Brace Other Brace: RLE soft knee  brace and LUE soft elbow brace (wearing schedule in RN orders); bil PRAFOs Restrictions Weight Bearing Restrictions: No    Mobility  Bed Mobility Overal bed mobility: Needs Assistance Bed Mobility: Rolling Rolling: Total assist         General bed mobility comments: Rolled bil for peri care and bed clean up as pt had urinated upon entry, total assist.  total assist to scoot to [facility]. Start Time: 1320 Angle: 65 degrees Total Minutes in Angle: 25 minutes (total during tilting) Patient Response: Flat affect  Transfers                 General transfer comment: Not assessed this session  Ambulation/Gait             General Gait Details: non ambulatory   Stairs             Wheelchair Mobility    Modified Rankin (Stroke Patients Only) Modified Rankin (Stroke Patients Only) Pre-Morbid Rankin Score: No symptoms Modified Rankin: Severe disability     Balance               Standing balance comment: Pt strapped into tilt bed in standing. Noted pt able to keep head and shoulders mostly midline with mild R lateral lean over time.                            Cognition Arousal/Alertness: Awake/alert Behavior During Therapy: Flat affect Overall Cognitive Status: Impaired/Different from baseline Area of Impairment: Attention;Following commands;Problem solving                 Orientation Level:  (  seems to recognize family, especially responsive to her husband, [name] when he is here) Current Attention Level: Focused Memory: Decreased short-term memory Following Commands: Follows one step commands with increased time;Follows one step commands inconsistently Safety/Judgement: Decreased awareness of safety;Decreased awareness of deficits Awareness: Intellectual Problem Solving: Slow processing;Decreased initiation;Difficulty sequencing;Requires verbal cues;Requires tactile cues        Exercises General Exercises - Upper Extremity Shoulder  Flexion: PROM;10 reps;Right;Left Other Exercises Other Exercises: B knee stretch into extension Other Exercises: Lt elbow passively stretched into extension Other Exercises: Gentle lumbar rotation stretch with B hips and knees flexed    General Comments        Pertinent Vitals/Pain Pain Assessment: Faces Faces Pain Scale: No hurt Pain Location: generalized with repositioning and mobility Pain Descriptors / Indicators: Grimacing;Guarding Pain Intervention(s): Limited activity within patient's tolerance;Monitored during session;Repositioned    Home Living                      Prior Function            PT Goals (current goals can now be found in the care plan section) Acute Rehab PT Goals Patient Stated Goal: Pt unable to state PT Goal Formulation: Patient unable to participate in goal setting Time For Goal Achievement: 01/11/21 Potential to Achieve Goals: Poor Additional Goals Additional Goal #1: Provide positioning, mobility, and transger education to family and have family participate in these activities during PT Progress towards PT goals: Progressing toward goals    Frequency    Min 2X/week      PT Plan Current plan remains appropriate    Co-evaluation              AM-PAC PT "6 Clicks" Mobility   Outcome Measure  Help needed turning from your back to your side while in a flat bed without using bedrails?: Total Help needed moving from lying on your back to sitting on the side of a flat bed without using bedrails?: Total Help needed moving to and from a bed to a chair (including a wheelchair)?: Total Help needed standing up from a chair using your arms (e.g., wheelchair or bedside chair)?: Total Help needed to walk in hospital room?: Total Help needed climbing 3-5 steps with a railing? : Total 6 Click Score: 6    End of Session Equipment Utilized During Treatment: Gait belt Activity Tolerance: Patient tolerated treatment well Patient left: in  bed;with call bell/phone within reach Nurse Communication: Mobility status PT Visit Diagnosis: Hemiplegia and hemiparesis;Other symptoms and signs involving the nervous system (R29.898);Other abnormalities of gait and mobility (R26.89) Hemiplegia - Right/Left: Right Hemiplegia - dominant/non-dominant: Dominant Hemiplegia - caused by: Cerebral infarction;Nontraumatic intracerebral hemorrhage;Other Nontraumatic intracranial hemorrhage     Time: 1300-1403 PT Time Calculation (min) (ACUTE ONLY): 63 min  Charges:  $Therapeutic Exercise: 8-22 mins $Therapeutic Activity: 23-37 mins $Neuromuscular Re-education: 8-22 mins                     Conni Slipper, PT, DPT Acute Rehabilitation Services Pager: [phone] Office: [phone]    Marylynn Pearson 12/28/2020, 2:59 PM

## 2020-12-28 NOTE — Progress Notes (Signed)
  Speech Language Pathology Treatment: Dysphagia;Cognitive-Linquistic  Patient Details Name: Charlene [name] MRN: [id number] DOB: February 13, 1966 Today's Date: 12/28/2020 Time: 2130-8657 SLP Time Calculation (min) (ACUTE ONLY): 28 min  Assessment / Plan / Recommendation Clinical Impression  Charlene [name] was alert; her mom, Charlene [name], was at bedside.  Treatment focused on training her mother how to feed [place] safely.  We reviewed video from recent MBS.  HOB was elevated and oral care provided; PMV in place.   Charlene [name] was provided with teaspoons of yogurt - she indicated readiness to participate by reaching for spoon with adapter on handle.  She opened her mouth in anticipation of arriving bolus and demonstrated mild oral spillage.  Wiped lips with max assist.  Demonstrated prolonged oral preparation but cleared oral cavity after each bolus.  She vocalized throughout session with clear phonation, no coughing. Charlene [name] fed her several bites until Charlene [name] demonstrated facial expressions that we interpreted as meaning she had had enough.  Charlene [name] may feed Charlene [name] purees from food stock when sitting upright and wearing PMV. Sign posted at [facility].     HPI HPI: Charlene [name] is 55 y/o female presents to [facility] on 2/9 with L sided weakness and headaches. PMH includes anxiety, HTN. 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 S/p Right frontal ventricular catheter placement on 2/10, stopped functioning on 2/11.  MRI 2/11 shows right greater than left mesial temporal lobes / parahippocampal cortex, bilateral basal ganglia, and possibly the upper pons that are concerning for acute infarcts, most likely secondary to mass effect from the hemorrhage. ETT 2/10; trach/PEG 09/10/20.  Trach changed to #6 cuffless 3/11; #4 cuffless 5/4. Hospital course complicated by persistent fever,  Serratia pneumonia and pre-orbital cellulitis.       SLP Plan  Continue with current plan of care       Recommendations  Diet recommendations: NPO (except may have purees from floor stock) Medication Administration: Via alternative means Supervision: Trained caregiver to feed patient Compensations: Minimize environmental distractions      Patient may use Passy-Muir Speech Valve: During all waking hours (remove during sleep)         Oral Care Recommendations: Oral care QID Follow up Recommendations: Skilled Nursing facility SLP Visit Diagnosis: Dysphagia, oropharyngeal phase (R13.12) Plan: Continue with current plan of care       GO               Daziah Hesler L. Samson [name], MA CCC/SLP Acute Rehabilitation Services Office number [phone] Pager [phone]  [name] [name] 12/28/2020, 12:20 PM

## 2020-12-29 LAB — GLUCOSE, CAPILLARY
Glucose-Capillary: 109 mg/dL — ABNORMAL HIGH (ref 70–99)
Glucose-Capillary: 110 mg/dL — ABNORMAL HIGH (ref 70–99)
Glucose-Capillary: 160 mg/dL — ABNORMAL HIGH (ref 70–99)
Glucose-Capillary: 112 mg/dL — ABNORMAL HIGH (ref 70–99)
Glucose-Capillary: 103 mg/dL — ABNORMAL HIGH (ref 70–99)

## 2020-12-29 NOTE — Plan of Care (Signed)
  Problem: Coping: Goal: Will identify appropriate support needs Outcome: Progressing   Problem: Health Behavior/Discharge Planning: Goal: Ability to manage health-related needs will improve Outcome: Progressing   Problem: Self-Care: Goal: Verbalization of feelings and concerns over difficulty with self-care will improve Outcome: Progressing Goal: Ability to communicate needs accurately will improve Outcome: Progressing   Problem: Nutrition: Goal: Risk of aspiration will decrease Outcome: Progressing   Problem: Intracerebral Hemorrhage Tissue Perfusion: Goal: Complications of Intracerebral Hemorrhage will be minimized Outcome: Progressing   Problem: Education: Goal: Knowledge of General Education information will improve Description: Including pain rating scale, medication(s)/side effects and non-pharmacologic comfort measures Outcome: Progressing   Problem: Health Behavior/Discharge Planning: Goal: Ability to manage health-related needs will improve Outcome: Progressing   Problem: Clinical Measurements: Goal: Ability to maintain clinical measurements within normal limits will improve Outcome: Progressing Goal: Will remain free from infection Outcome: Progressing Goal: Diagnostic test results will improve Outcome: Progressing Goal: Respiratory complications will improve Outcome: Progressing Goal: Cardiovascular complication will be avoided Outcome: Progressing   Problem: Coping: Goal: Level of anxiety will decrease Outcome: Progressing   Problem: Elimination: Goal: Will not experience complications related to bowel motility Outcome: Progressing Goal: Will not experience complications related to urinary retention Outcome: Progressing   Problem: Pain Managment: Goal: General experience of comfort will improve Outcome: Progressing   Problem: Skin Integrity: Goal: Risk for impaired skin integrity will decrease Outcome: Progressing   Problem: Intracerebral  Hemorrhage Tissue Perfusion: Goal: Complications of Intracerebral Hemorrhage will be minimized Outcome: Progressing   Problem: Ischemic Stroke/TIA Tissue Perfusion: Goal: Complications of ischemic stroke/TIA will be minimized Outcome: Progressing   

## 2020-12-29 NOTE — Progress Notes (Signed)
[facility] PROGRESS NOTE  NAVPREET SZCZYGIEL WER:[id number] DOB: 06/29/1966 DOA: 08/26/2020 PCP: Patient, [name] (Inactive)    October 08, 2020     10/16/2020                        10/16/2020     10/18/2020                        10/20/2020    Status: Remains inpatient appropriate because:Altered mental status and Unsafe d/c plan: Requirement to discharge to trach capable SNF   Dispo: The patient is from: Home              Anticipated d/c is to: SNF trach capable-[place]-trach team re eval 6/3 and still rec NOT to decannulate 2/2 menattion              Patient currently is not medically stable to d/c.   Difficult to place patient Yes   Level of care: Telemetry Medical  Code Status: Full Family Communication: [name] [phone]) is her POA and legal contact.  [name] was at bedside on 5/18 and given update.  [name] at bedside 6/6 DVT prophylaxis: Chronically bedbound.  5/6: We will discontinue prophylactic Lovenox   Vaccination status: Unvaccinated-husband still undecided.  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  Subsequently on 2/10, she developed obtundation and respiratory arrest leading to intubation.  She has remained persistently obtunded, now has a trach and a PEG.  Hospital course complicated by persistent fever, Serratia pneumonia and now pre-orbital cellulitis  As of 3/25 with adjustments in  hypertonicity medications patient began to improve regarding hypertonicity.  Eventually started on provigil with improvements in alertness.  Evaluated by rehab physician who recommended increasing provigil dose further.  Subsequently patient has become more alert.  Most days she is able to track and with encouragement able to follow simple commands especially since partial obstruction glasses placed.  See progress note dictated 4/27 for expanded details.   2/09 Admit for [place] 2/10 EVD placed, later intubated due to obtundation and respiratory arrest 2/11 MRI Brain, hypertonic saline d/c'd, EVD not working.  Husband did not consent to PICC  2/13 EVD removed 2/17 [place] discussion with family, PMT 2/24 Bedside trach and PEG 3/1 Transferred to [facility]  Subjective: Awaken from sleep.  Once fully awake and patient reached for the sheet and pulled it up to her face to purposely wipe her mouth where she had been drooling while asleep.  When I discussed the possibility of having putting soon her eyes got big and she started making chewing motions with her mouth  Objective: Vitals:   12/29/20 0351 12/29/20 0417  BP: 134/83 129/82  Pulse: 81 79  Resp: 12 14  Temp: 98 F (36.7 C) 98 F (36.7 C)  SpO2: 98% 100%    Intake/Output Summary (Last 24 hours) at 12/29/2020 0813 Last data  filed at 12/29/2020 0351 Gross per 24 hour  Intake --  Output 600 ml  Net -600 ml    Filed Weights   12/27/20 0400 12/28/20 0441 12/29/20 0500  Weight: 53.5 kg 53.4 kg 53.6 kg    Exam:  Constitutional: Awake and, calm, no acute distress Respiratory: #4.0 cuffless trach, trach collar with FiO2 21% at 5L, lungs remain clear on anterior exam.  No increased work of breathing.  No reported coughing or choking with administration of pudding Cardiovascular: S1-S2, no JVD, no peripheral edema, pulse remains regular, normotensive Abdomen:  PEG for tube feeding-LBM 6/12.  Soft and nontender with normoactive bowel  sounds Musculoskeletal: Bilateral atrophy of thigh and calf muscles-continues to have some right knee pain that is worse with extension Neurologic: Awakens easily.  Noted with purposeful movement of right arm today-grabbed sheet to wipe drool from side of face.  Less significant movement in left arm.  Continues to have mild left disconjugate gaze but has control over her eye and is able to move both eyes equally tracking.  Does not make any definitive purposeful movements with lower extremities other than to flex at the hips for comfort. Psych: Alert.  Since she is nonverbal or with limited attempts at vocalization we are unable to assess orientation.   Assessment/Plan: Acute problems: Multiple acute strokes  Right Thalamus ICH with IVH  Obstructive Hydrocephalus/Acute on persistent metabolic Encephalopathy/brain injury related hypertonicity -Continue baclofen and tizanidine for spasticity.  -Continue Provigil -Continue PT/OT/SLP -Reevaluated by CIR on 5/2 with SNF recommended based on lack of meaningful goals movement at that time   -Subsequently patient has been making small gains including improved wakefulness, tracking, attempts at phonation, ability to eat pured foods and when assistive spoon placed in her hand with pured food on spoon she is able to feed herself  Colonic Ileus/recurrent constipation/loose stools -6/6 ileus resolved  -Continue Miralax prn -Continue fiber supplementation  Bilateral knee osteoarthritis/reported ligamentous injury to right knee/right hip pain -Has completed a short course of prednisone, also treated with scheduled Tylenol and NSAIDs. -History of remote MCL tear. CT right knee on 6/9 with mild tricompartmental osteoarthritis with small joint effusion with findings confirming likely torn MCL -Continue scheduled Ultram noting she appears more comfortable today -Voltaren gel to knees and hip -As of 6/14 pain appears to be controlled  Hypertensive  emergency/Malignant HTN -Due to financial constraints patient was not taking carvedilol and hydrochlorothiazide prior to admission -Continue Norvasc, Lopressor and hydralazine. -Cardura for urinary retention   Acute hypoxemic respiratory failure secondary to CVA requiring mechanical ventilation (Resolved) Trach dependent -Downsized to #4 cuffless trach (5/8) -Despite tolerating capping trials trach team documents her baseline encephalopathy and poor cough mechanics preclude decannulation given extreme risk of airway compromise (death from aspiration).  They state she is not a candidate for decannulation and as of 5/30 they have signed off although recommended to call back if mental status improves  Dysphagia -Continue tube feedings, Prosource liquid, free water -Follow CBGs and provide SSI -Continue supervised pured diet allowing family to administer orals after adequate training -Mother at bedside and requesting SLP assist with training administering orals  History of urinary retention/recurrent Klebsiella UTI -Continue Cardura -Discussed with ID pharmacist.  Continue Rocephin for total of 7 days     Other problems: Right upper extremity thrombophlebitis - [name] -> age indeterminate SVT of right cephalic vein  Neck Contracture/torticollis -- Resolved  ESBL positive Enterobacter UTI -Has completed 5 days of meropenem -Contact isolation during hospitalization -continue  Oxybutynin suspected bladder spasms  Klebsiella UTI -Resistant to nitrofurantoin and ampicillin -Meropenem transitioned to Rocephin on 5/22 -To minimize risk of recurrent UTI have asked that pure wick only be used at bedtime on a 10 PM to 8 AM schedule  Recurrent diarrhea -Resolved -Likely combination of recent antibiotics as well as scheduled laxative -MiraLAX now as needed  Bacterial Conjunctivitis /periorbital cellulitis -Resolved  Chronic pansinusitis/recurrent fevers 2/2 aspiration PNA;History of  Serratia Pneumonia  Hospital Acquired Pneumonia:  -Completed several courses of antibiotic -Follow-up imaging including sinus CT consistent with chronic but stable and resolving sinusitis   Anemia -Stable  HLD -Continue Lipitor      Data Reviewed: Basic Metabolic Panel: No results for input(s): NA, K, CL, CO2, GLUCOSE, BUN, CREATININE, CALCIUM, MG, PHOS in the last 168 hours.  Liver Function Tests: No results for input(s): AST, ALT, ALKPHOS, BILITOT, PROT, ALBUMIN in the last 168 hours.  CBC: No results for input(s): WBC, NEUTROABS, HGB, HCT, MCV, PLT in the last 168 hours.   CBG: Recent Labs  Lab 12/28/20 1116 12/28/20 1618 12/28/20 2041 12/28/20 2336 12/29/20 0416  GLUCAP 110* 103* 100* 201* 109*     Studies: No results found.   Scheduled Meds:  amLODipine  10 mg Per Tube Daily   atorvastatin  40 mg Per Tube Daily   baclofen  10 mg Per Tube 3 times per day   chlorhexidine gluconate (MEDLINE KIT)  15 mL Mouth Rinse BID   Chlorhexidine Gluconate Cloth  6 each Topical Daily   diclofenac Sodium  4 g Topical QID   doxazosin  2 mg Per Tube Daily   feeding supplement (PROSource TF)  45 mL Per Tube Daily   fiber  1 packet Per Tube BID   free water  200 mL Per Tube Q4H   hydrALAZINE  50 mg Per Tube Q8H   insulin aspart  0-9 Units Subcutaneous Q4H   mouth rinse  15 mL Mouth Rinse 5 X Daily   metoprolol tartrate  100 mg Per Tube BID   modafinil  200 mg Per Tube Daily   tiZANidine  4 mg Per Tube QHS   traMADol  25 mg Per Tube Q6H   Continuous Infusions:  sodium chloride Stopped (10/26/20 1445)   feeding supplement (OSMOLITE 1.2 CAL) 60 mL/hr at 12/27/20 0400    Principal Problem:   ICH (intracerebral hemorrhage) ([place]) Active Problems:   Acute hypoxemic respiratory failure (HCC)   Tracheostomy dependent ([place])   Palliative care by specialist   Muscle hypertonicity   Oropharyngeal dysphagia   Poor prognosis   Aspiration pneumonia of right lower lobe  ([name])   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  Palliative care.   PCCM continues to follow.  No decannulation.   Procedures: Tracheostomy PEG tube EEG Echocardiogram  Antibiotics: Vancomycin 2/12-2/15 Maxipime 2/12-2/15 Ancef 2/19-2/27 Vancomycin 2/20 x 1 dose Maxipime 2/27- 3/5 Vancomycin 3/1-3/3 Ancef 3/6-3/14 Cipro 3/20 4-3/30 Unasyn 3/30-4/4 Rocephin 4/11-4/12 Meropenem 4/12-4/17   Time spent: 25 minutes    Erin Hearing ANP  [facility] 7 am - 330 pm/M-F for direct patient care and secure chat Please refer to [place] for contact info 124  Days

## 2020-12-30 DIAGNOSIS — Z9189 Other specified personal risk factors, not elsewhere classified: Secondary | ICD-10-CM

## 2020-12-30 LAB — GLUCOSE, CAPILLARY
Glucose-Capillary: 111 mg/dL — ABNORMAL HIGH (ref 70–99)
Glucose-Capillary: 118 mg/dL — ABNORMAL HIGH (ref 70–99)
Glucose-Capillary: 126 mg/dL — ABNORMAL HIGH (ref 70–99)
Glucose-Capillary: 141 mg/dL — ABNORMAL HIGH (ref 70–99)
Glucose-Capillary: 142 mg/dL — ABNORMAL HIGH (ref 70–99)
Glucose-Capillary: 143 mg/dL — ABNORMAL HIGH (ref 70–99)
Glucose-Capillary: 176 mg/dL — ABNORMAL HIGH (ref 70–99)

## 2020-12-30 NOTE — Progress Notes (Signed)
NAME:  VICKY SCHLEICH, MRN:  [id number], DOB:  April 28, 1966, LOS: 125 ADMISSION DATE:  08/26/2020, CONSULTATION DATE:  2/10 REFERRING MD:  Roda Shutters, CHIEF COMPLAINT:  headache   History of Present Illness:  55 year old female admitted with hypertensive R thalamic/basal ganglia ICH. Resultant obstructive hydrocephalus prompting EVD placement 2/10. Subsequently, 2/10 she had acute mental status change to obtundation and respiratory arrest resulting in intubation. Trach and Peg 2/24. Trach change to #6 cuffless on 3/11.   Pertinent  Medical History  Hypertension Medication non-compliance  Significant Hospital Events: Including procedures, antibiotic start and stop dates in addition to other pertinent events   2/09 Admit for ICH 2/10 EVD placed, later intubated due to obtundation and respiratory arrest 2/11 MRI Brain, hypertonic saline d/c'd, EVD not working.  Husband did not consent to PICC  2/13 EVD removed 2/17 GOC discussion with family, PMT 3/11 trach changed to Shiley 6 uncuffed 4/18 28% ATC / 5L  5/8 downsized trach to 4 5/16 tolerating z 4 well. No real functional improvements since 5/8 5/ 23 no improvement in mental status 5/20 no improvement of mental status   ETT 2/10 >> 09/10/2020 EVD 2/10 > non functioning 2/11 at 1500; clamped 2/12 am > 2/13 Trach (Dr. Denese Killings) 2/24 >>  PEG placement (Dr. Sheliah Hatch) 2/24 >>  CT Head 2/9 >> Acute hemorrhage with the epicenter in the right thalamus. Measurement is approximately 3.2 x 2.1 x 2.5 cm (volume 8.8 cm^3). Intraventricular penetration with blood filling the third and fourth ventricles and nearly filling the right lateral ventricle. No hydrocephalus. CTA Head/Neck 2/10 >> Unchanged size of intraparenchymal hemorrhage centered in the right thalamus and basal ganglia with extension into the right lateral ventricle. 2. No intracranial arterial occlusion or high-grade stenosis. No dissection, aneurysm or hemodynamically significant stenosis of  the carotid or vertebral arteries. CT Head 2/10 >> Stable parenchymal hemorrhage in the right thalamus and basal ganglia with intraventricular extension as described. No new focal abnormality is noted. CT Head 2/10 (post intubation) >> redemonstrated intraparenchymal hemorrhage centered within the R thalamus/basal ganglia extending into the cerebral peduncle/midbrain with associated intraventricular extension and hemorrhage in the R lateral ventricle and fourth ventricle, overall not significantly changed from prior; stable mass effect, 31mm of R to L midline shift, postsurgical changes of ventriculostomy TTE 2/10 >> LVEF 60 to 65%, LV has normal function, no regional wall motion abnormalities, mild concentric LVH.  RV systolic function is normal, normal pulmonary artery systolic pressure.  MRA head 2/11 >>Negative intracranial MR angiography. No evidence of aneurysm or high flow vascular malformation to explain the intraparenchymal hemorrhage  MRI brain 2/11 >> Similar intraparenchymal hemorrhage centered within the right thalamus and basal ganglia extending into the right midbrain with intraventricular extension, effacement of the prepontine cistern and similar 4 mm of leftward midline shift; separate areas of restricted diffusion and edema involving the R > L mesial temporal lobes/parahippocampal cortex, bilateral basal ganglia, and possibly the upper pons that are c/f acute infarcts, most likely secondary to mass effect from hemorrhage. MRI brain 2/19 >> intraventricular clot has decreased in size but mid-brain parenchymal clot has not and there are more ischemic infarcts in the surrounding brain.  CT Head WO Contrast 09/07/20>> Expected evolution of intracranial hemorrhage since 08/30/2020 head CT. Multiple areas of low attenuation corresponding to some of the infarcts seen on prior MRI. Ventricle caliber similar to prior MRI  Interim History / Subjective:  Asked by Hospitalist LOS medical advisor for  reassessment of tracheostomy need and  consideration for decannulation. Per RT- Has not needed rescue suctioning in a while. Won't cough for RT when instructed to do so. RT is concerned about her ability to protect her airway given lack of cough. Her daughter in law is at bedside today and indicates that today she is more fatigued than her usual baseline. She was too tired to work with PT today. Has been eating applesauce only.  Objective   Blood pressure 120/86, pulse 77, temperature 98.2 F (36.8 C), temperature source Axillary, resp. rate 15, height 5\' 5"  (1.651 m), weight 53.6 kg, last menstrual period 03/03/2014, SpO2 100 %, unknown if currently breastfeeding.    FiO2 (%):  [21 %] 21 %   Intake/Output Summary (Last 24 hours) at 12/30/2020 1444 Last data filed at 12/30/2020 0018 Gross per 24 hour  Intake --  Output 250 ml  Net -250 ml    Filed Weights   12/27/20 0400 12/28/20 0441 12/29/20 0500  Weight: 53.5 kg 53.4 kg 53.6 kg    Examination:  General: frail, chronically ill appearing woman lying in bed sleeping, arouses to stimulation HEENT: [place]/AT, eyes anicteric, breathing comfortably on TC Neck: tracheostomy, uncuffed Shiley #4 Cards: skin well perfused Resp: nonlabored, no tachypnea. No secretions during tracheal suctioning Abd: ND Ext: warm, minimal muscle mass, no significant edema. Neuro: wakes up with stimulation, slow responses. She moves her R arm towards her trach slowly with suctioning. Grimaces but no cough or gag to deep pharyngeal suctioning. When suctioned through her trach, she has a 3-4 second delayed, very weak cough.   Labs/imaging that I havepersonally reviewed  (right click and "Reselect all SmartList Selections" daily)  Resp culture 12/17/20: K. Pneumoniae 6/3 Swallow study: mild posterior tracheal aspiration with thin liquids with delayed cough reflex. Mild difficulty with oral control, piecemeal bolusing of liquids into pharynx. Pharyngeal swallow  consistently triggered at pyriforms. Consistent eiglottic closure over larynx when swallow triggered. Excellent pharyngeal squeeze. Occasional trace penetrations of liquids over the arytenoids with hovering over inferior vocal folds.     Resolved Hospital Problem list     Assessment & Plan:  Tracheostomy dependence post CVA Severe deconditioning Muscle wasting Poor cough mechanics persist  Discussion She has tolerated capping trials previously this admission. However, her baseline encephalopathy and poor cough mechanics preclude decannulation given extreme risk of airway compromise. This continues to be true. Any decision to decannulate should be with minimum 1 week repeat capping trial and discussion with her family of risks, benefits, alternatives to decannulation. I do not feel that her cough is currently effective to clear any significant secretions if she had tracheal penetration. I think she would be high risk for reintubation in the future. I discussed this with RT and her daughter in law at bedside today. Her daughter in law would like her husband who will be here tomorrow to be present for this discussion and make a decision if capping trials should move forward.  Plan -Continue routine trach care. -Will reassess cough and gag reflexes tomorrow when she hopefully will be more alert. I will discuss the possibility of decannulating with her husband tomorrow. No capping trials until I discuss with him.    Best practice (right click and "Reselect all SmartList Selections" daily)  Per primary   8/3, DO 12/30/20 3:14 PM [facility]  For contact information, see Amion. If no response to pager, please call PCCM consult pager. After hours, 7PM- 7AM, please call Elink.

## 2020-12-30 NOTE — Progress Notes (Signed)
  Speech Language Pathology Treatment: Cognitive-Linquistic;Dysphagia  Patient Details Name: Charlene [name] MRN: [id number] DOB: 24-Sep-1965 Today's Date: 12/30/2020 Time: 4034-7425 SLP Time Calculation (min) (ACUTE ONLY): 13 min  Assessment / Plan / Recommendation Clinical Impression  Daughter-in-law at bedside. Charlene [name] was very sleepy today - could be roused briefly, opening eyes.  PMV placed and she continues to tolerate it quite well. Vocalizations today tended to be more automatic than intentional given how drowsy she was.  She required max tactile/verbal cues to awaken and attend to surroundings.  She demonstrated spontaneous swallowing of secretions, but could not be awakened sufficiently to have PO trials.  She has had some POs with trained caregiver (Mom) and has done well with them and derived some enjoyment from her mother's perspective. Will continue to work towards increased PO consumption and communication goals.   HPI HPI: Pt is 55 y/o female presents to [facility] on 2/9 with L sided weakness and headaches. PMH includes anxiety, HTN. 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 S/p Right frontal ventricular catheter placement on 2/10, stopped functioning on 2/11.  MRI 2/11 shows right greater than left mesial temporal lobes / parahippocampal cortex, bilateral basal ganglia, and possibly the upper pons that are concerning for acute infarcts, most likely secondary to mass effect from the hemorrhage. ETT 2/10; trach/PEG 09/10/20.  Trach changed to #6 cuffless 3/11; #4 cuffless 5/4. Hospital course complicated by persistent fever,  Serratia pneumonia and pre-orbital cellulitis.      SLP Plan  Continue with current plan of care       Recommendations  Medication Administration: Via alternative means Supervision: Trained caregiver to feed  patient Compensations: Minimize environmental distractions Postural Changes and/or Swallow Maneuvers: Seated upright 90 degrees                Oral Care Recommendations: Oral care QID Follow up Recommendations: Skilled Nursing facility SLP Visit Diagnosis: Dysphagia, oropharyngeal phase (R13.12) Plan: Continue with current plan of care       GO               Airen Dales L. Samson [name], MA CCC/SLP Acute Rehabilitation Services Office number [phone] Pager [phone]  [name] Charlene [name] 12/30/2020, 3:40 PM

## 2020-12-30 NOTE — Progress Notes (Addendum)
[facility] PROGRESS NOTE  [name] EGB:[id number] DOB: [birth year] DOA: 08/26/2020 PCP: Charlene [name], [name] (Inactive)    October 08, 2020     10/16/2020                        10/16/2020     10/18/2020                        10/20/2020      12/30/2020                   12/30/2020   Status: Remains inpatient appropriate because:Altered mental status and Unsafe d/c plan: Requirement to discharge to trach capable SNF   Dispo: The Charlene [name] is from: Home              Anticipated d/c is to: SNF trach capable-[place]-trach team re eval 6/3 and still rec NOT to decannulate 2/2 menattion              Charlene [name] currently is not medically stable to d/c.   Difficult to place Charlene [name] Yes   Level of care: Telemetry Medical  Code Status: Full Family Communication: husband Charlene [name] [phone]) is her POA and legal contact.  Charlene [name] was at bedside on 5/18 and given update.  Charlene [name] at bedside 6/6 DVT prophylaxis: Chronically bedbound.  5/6: We will discontinue prophylactic Lovenox   Vaccination status: Unvaccinated-husband still undecided.  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  Subsequently on 2/10, she developed obtundation and respiratory arrest leading to intubation.  She has remained persistently obtunded, now has a trach and a PEG.  Hospital course complicated by persistent fever, Serratia pneumonia and now pre-orbital  cellulitis  As of 3/25 with adjustments in hypertonicity medications Charlene [name] began to improve regarding hypertonicity.  Eventually started on provigil with improvements in alertness.  Evaluated by rehab physician who recommended increasing provigil dose further.  Subsequently Charlene [name] has become more alert.  Most days she is able to track and with encouragement able to follow simple commands especially since partial obstruction glasses placed.  See progress note dictated 4/27 for expanded details.   2/09 Admit for [place] 2/10 EVD placed, later intubated due to obtundation and respiratory arrest 2/11 MRI Brain, hypertonic saline d/c'd, EVD not working.  Husband did not consent to PICC  2/13 EVD removed 2/17 [place] discussion with family, PMT 2/24 Bedside trach and PEG 3/1 Transferred to [facility]  Subjective: Awaken from sleep.  Discussed that today is Wednesday and this is her husband Charlene [name]'s usual day for visitation.  With the mention of her husband's name she raised her eyebrows.  Objective: Vitals:   12/30/20 0016 12/30/20 0423  BP: 117/67 122/70  Pulse: 72 70  Resp: 14 12  Temp: 98.3 F (36.8 C) 98.2 F (36.8 C)  SpO2: 100% 100%    Intake/Output Summary (Last 24 hours) at 12/30/2020 0755 Last data  filed at 12/30/2020 0018 Gross per 24 hour  Intake 0 ml  Output 250 ml  Net -250 ml    Filed Weights   12/27/20 0400 12/28/20 0441 12/29/20 0500  Weight: 53.5 kg 53.4 kg 53.6 kg    Exam:  Constitutional: Awakened, pleasant and in no acute distress Respiratory: #4.0 cuffless trach, trach collar with FiO2 21% at 5L, lungs are clear.  Upon awakening after 5 minutes was noted to have a weak cough with clear sputum expectorated through trach.  No increased work of breathing Cardiovascular: Normal heart sounds, normotensive, no peripheral edema. Abdomen:  PEG for tube feeding-LBM 6/12.  Soft and nontender with normoactive bowel sounds Musculoskeletal: Bilateral atrophy of thigh and calf  muscles-continues to have some right knee pain that is worse with extension Neurologic:  Psych:    Assessment/Plan: Acute problems: Multiple acute strokes  Right Thalamus ICH with IVH  Obstructive Hydrocephalus/Acute on persistent metabolic Encephalopathy/brain injury related hypertonicity -Continue baclofen and tizanidine for spasticity. Continue Provigil -Continue PT/OT/SLP -Reevaluated by CIR on 5/2 with SNF recommended based on lack of any meaningful improvement at that time   -Subsequently Charlene [name] has been making small gains including improved wakefulness, tracking, attempts at phonation, ability to eat pured foods and when assistive spoon placed in her hand with pured food on spoon she is able to feed herself  Colonic Ileus/recurrent constipation/loose stools -6/6 ileus resolved  -Continue Miralax prn -Continue fiber supplementation  Hypertensive emergency/Malignant HTN -Due to financial constraints Charlene [name] was not taking carvedilol and hydrochlorothiazide prior to admission -Continue Norvasc, Lopressor and hydralazine. -Cardura for urinary retention   Acute hypoxemic respiratory failure secondary to CVA requiring mechanical ventilation (Resolved) Trach dependent -Downsized to #4 cuffless trach (5/8) -Despite tolerating capping trials trach team documents her baseline encephalopathy and poor cough mechanics preclude decannulation given extreme risk of airway compromise (death from aspiration).  They state she is not a candidate for decannulation and as of 5/30 they have signed off although recommended to call back if mental status improves  Dysphagia -Continue tube feedings, Prosource liquid, free water -Follow CBGs and provide SSI -Continue supervised pured diet allowing family to administer orals after adequate training -Mother at bedside and requesting SLP assist with training administering orals  History of urinary retention/recurrent Klebsiella UTI -Continue  Cardura -Discussed with ID pharmacist.  Continue Rocephin for total of 7 days     Other problems: Right upper extremity thrombophlebitis - [name] -> age indeterminate SVT of right cephalic vein  Neck Contracture/torticollis -- Resolved  ESBL positive Enterobacter UTI -Has completed 5 days of meropenem -Contact isolation during hospitalization -continue Oxybutynin suspected bladder spasms  Klebsiella UTI -Resistant to nitrofurantoin and ampicillin -Meropenem transitioned to Rocephin on 5/22 -To minimize risk of recurrent UTI have asked that pure wick only be used at bedtime on a 10 PM to 8 AM schedule  Recurrent diarrhea -Resolved -Likely combination of recent antibiotics as well as scheduled laxative -MiraLAX now as needed  Bacterial Conjunctivitis /periorbital cellulitis -Resolved  Chronic pansinusitis/recurrent fevers 2/2 aspiration PNA;History of Serratia Pneumonia  Hospital Acquired Pneumonia:  -Completed several courses of antibiotic -Follow-up imaging including sinus CT consistent with chronic but stable and resolving sinusitis   Anemia -Stable  HLD -Continue Lipitor  Bilateral knee osteoarthritis/reported ligamentous injury to right knee/right hip pain -Has completed a short course of prednisone, also treated with scheduled Tylenol and NSAIDs. -History of remote MCL tear. CT right knee on 6/9 with mild tricompartmental osteoarthritis with small joint effusion with findings confirming  likely torn MCL -Continue scheduled Ultram noting she appears more comfortable today -Voltaren gel to knees and hip -As of 6/14 pain appears to be controlled    Data Reviewed: Basic Metabolic Panel: No results for input(s): NA, K, CL, CO2, GLUCOSE, BUN, CREATININE, CALCIUM, MG, PHOS in the last 168 hours.  Liver Function Tests: No results for input(s): AST, ALT, ALKPHOS, BILITOT, PROT, ALBUMIN in the last 168 hours.  CBC: No results for input(s): WBC, NEUTROABS, HGB, HCT,  MCV, PLT in the last 168 hours.   CBG: Recent Labs  Lab 12/29/20 1113 12/29/20 1559 12/29/20 2017 12/30/20 0016 12/30/20 0422  GLUCAP 160* 112* 103* 126* 143*     Studies: No results found.   Scheduled Meds:  amLODipine  10 mg Per Tube Daily   atorvastatin  40 mg Per Tube Daily   baclofen  10 mg Per Tube 3 times per day   chlorhexidine gluconate (MEDLINE KIT)  15 mL Mouth Rinse BID   Chlorhexidine Gluconate Cloth  6 each Topical Daily   diclofenac Sodium  4 g Topical QID   doxazosin  2 mg Per Tube Daily   feeding supplement (PROSource TF)  45 mL Per Tube Daily   fiber  1 packet Per Tube BID   free water  200 mL Per Tube Q4H   hydrALAZINE  50 mg Per Tube Q8H   insulin aspart  0-9 Units Subcutaneous Q4H   mouth rinse  15 mL Mouth Rinse 5 X Daily   metoprolol tartrate  100 mg Per Tube BID   modafinil  200 mg Per Tube Daily   tiZANidine  4 mg Per Tube QHS   traMADol  25 mg Per Tube Q6H   Continuous Infusions:  sodium chloride Stopped (10/26/20 1445)   feeding supplement (OSMOLITE 1.2 CAL) 1,000 mL (12/29/20 1232)    Principal Problem:   ICH (intracerebral hemorrhage) ([place]) Active Problems:   Acute hypoxemic respiratory failure (HCC)   Tracheostomy dependent ([place])   Palliative care by specialist   Muscle hypertonicity   Oropharyngeal dysphagia   Poor prognosis   Aspiration pneumonia of right lower lobe ([place])   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   Acute cystitis without hematuria   Ileus, unspecified ([place])   Constipation   Consultants: Neurology  Neurosurgery.  Palliative care.   PCCM continues to follow.  No decannulation.   Procedures: Tracheostomy PEG tube EEG Echocardiogram  Antibiotics: Vancomycin 2/12-2/15 Maxipime 2/12-2/15 Ancef 2/19-2/27 Vancomycin 2/20 x 1 dose Maxipime 2/27- 3/5 Vancomycin 3/1-3/3 Ancef 3/6-3/14 Cipro 3/20 4-3/30 Unasyn 3/30-4/4 Rocephin  4/11-4/12 Meropenem 4/12-4/17   Time spent: 25 minutes    Erin Hearing ANP  [facility] 7 am - 330 pm/M-F for direct Charlene [name] care and secure chat Please refer to [place] for contact info 125  Days

## 2020-12-30 NOTE — Progress Notes (Signed)
Assisted Dr. Kemper Durie with assessment for decannulation.  When suctioned orally patient had no cough.  Patient has very weak non productive cough when suctioned through trach.  Will attempt capping trial tomorrow with Dr. Burna Forts order.  Patient tolerated well, RT to monitor

## 2020-12-31 ENCOUNTER — Inpatient Hospital Stay (HOSPITAL_COMMUNITY): Payer: Medicaid Other

## 2020-12-31 DIAGNOSIS — R1310 Dysphagia, unspecified: Secondary | ICD-10-CM

## 2020-12-31 LAB — GLUCOSE, CAPILLARY
Glucose-Capillary: 86 mg/dL (ref 70–99)
Glucose-Capillary: 127 mg/dL — ABNORMAL HIGH (ref 70–99)
Glucose-Capillary: 118 mg/dL — ABNORMAL HIGH (ref 70–99)
Glucose-Capillary: 104 mg/dL — ABNORMAL HIGH (ref 70–99)
Glucose-Capillary: 130 mg/dL — ABNORMAL HIGH (ref 70–99)

## 2020-12-31 IMAGING — DX DG ABD PORTABLE 1V
1 series · 1 of 1 positions shown · non-contrast
Comparison: [DATE]

CLINICAL DATA: Possible ileus

EXAM:
PORTABLE ABDOMEN - 1 VIEW

[abdomen kub]
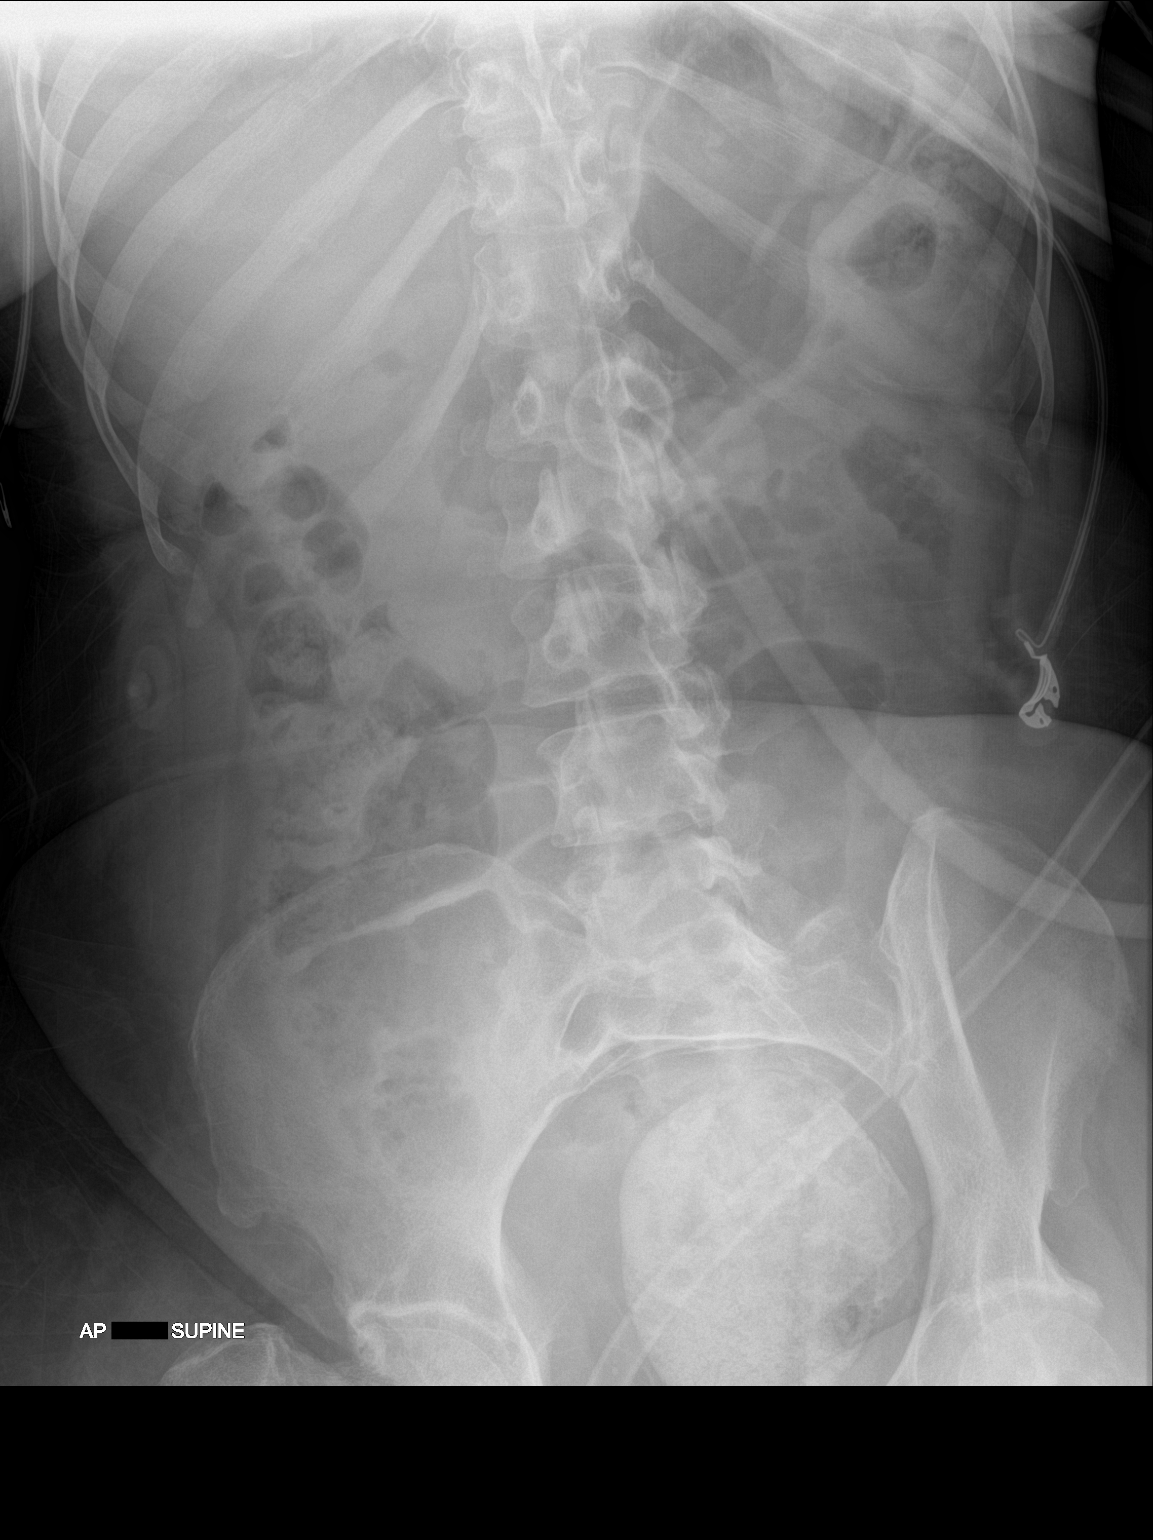

[1 of 1 positions shown; findings below may reference images not displayed]

FINDINGS: Gastrostomy tube is present. Bowel gas pattern is unremarkable.
Stool is primarily present within the rectum; increased density may
reflect suggests prior contrast.
IMPRESSION: Normal bowel gas pattern.  Retained stool in the rectum.

## 2020-12-31 MED ORDER — FLEET ENEMA 7-19 GM/118ML RE ENEM: 1.0000 | ENEMA | Freq: Once | RECTAL | Status: DC

## 2020-12-31 MED ORDER — BISACODYL 10 MG RE SUPP: 10.0000 mg | Freq: Once | RECTAL | Status: DC

## 2020-12-31 MED ORDER — SORBITOL 70 % SOLN
960.0000 mL | TOPICAL_OIL | Freq: Once | ORAL | Status: AC
  Administered 2020-12-31: 960 mL via RECTAL
  Filled 2020-12-31: qty 473

## 2020-12-31 NOTE — TOC Progression Note (Addendum)
Transition of Care [facility]) - Progression Note    Patient Details  Name: Charlene [name] MRN: [id number] Date of Birth: Nov 17, 1965  Transition of [facility]) CM/SW Contact  Janae Bridgeman, RN Phone Number: 12/31/2020, 7:46 AM  Clinical Narrative:    CM and MSW continue to follow the patient for transitions of care needs.  The patient has a pending court date with [facility] Team to discuss guardianship.  The patient's family is not willing to agree to admission to [facility] in [place], [place] - which is the only bed offer at this time that has offered admission to the patient and family - due to patient's complex care needs.  CM and MSW will continue to follow the patient for TOC needs.  CM spoke with Selena Batten, CM at [facility] and she states that [facility], [facility] and [facility] were skilled nursing facilities that were able to care for patients with complex care needs.  Kim, CM states that she was willing to review the patient's clinicals in the hub at this time - clinicals were resent to the facilities to include initial referral documents along with recent therapy notes.   Expected Discharge Plan: Skilled Nursing Facility Barriers to Discharge: Continued Medical Work up  Expected Discharge Plan and Services Expected Discharge Plan: Skilled Nursing Facility In-house Referral: Clinical Social Work Discharge Planning Services: CM Consult Post Acute Care Choice: Skilled Nursing Facility Living arrangements for the past 2 months: Single Family Home                                       Social Determinants of Health (SDOH) Interventions    Readmission Risk Interventions Readmission Risk Prevention Plan 09/29/2020  Transportation Screening Complete  PCP or Specialist Appt within 3-5 Days Complete  HRI or Home Care Consult Complete  Social Work Consult for Recovery Care Planning/Counseling Complete  Palliative Care  Screening Complete  Medication Review [facility]) Complete  Some recent data might be hidden

## 2020-12-31 NOTE — Progress Notes (Signed)
Nutrition Follow-up  DOCUMENTATION CODES:  Not applicable  INTERVENTION:  Continue Tube feeding via PEG tube: -Osmolite 1.2 at 60 ml/h (1440 ml per day) -45 ml ProSource TF daily   Provides 1768 kcal, 90 gm protein, 1180 ml free water daily   -200 ml free water every 4 hours    NUTRITION DIAGNOSIS:  Inadequate oral intake related to inability to eat as evidenced by NPO status. 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  Subsequently on 2/10, pt developed obtundation and respiratory arrest leading to intubation. Pt is now s/p trach/PEG placement. Hospital course complicated by Serratia pneumonia, ESBL Enterobacter UTI and pre-orbital cellulitis.   2/10 s/p EVD placement and intubation for respiratory arrest 2/13 EVD removed 2/17 palliative care consult; family requesting tx to [facility]  2/24 s/p trach and PEG  3/1 tx to [facility] 3/11 trach changed to [place] #6 cuffless 4/08 pt w/ sepsis 2/2 UTI 4/18 28% ATC/5L 5/2 CIR reevaluated pt and determined she has not made enough improvement for admit; recommended SNF 5/8 trach changed to [place] #4 cuffless  5/23 CCM evaluated and determined pt stilll unable to decannulate due to variable mental status 6/3 trach exchanged, still [place] #4 cuffless  Per CCM, pt will likely be poor candidate for decannulation for months. [place] discussions are ongoing with family. Pt remains NPO with SLP following. Per RN, pt still tolerating TF regimen via PEG: Osmolite 1.2 at 60 ml/h w/ 45 ml ProSource TF daily, 23m free water flushes Q4H. Pt's weight appears to be stable over the last week  since adjustment to TF regiment was made, so will continue with current nutrition plan of care and monitor.   UOP: 1.7L x24 hours   Medications: Scheduled Meds:  amLODipine  10 mg Per Tube Daily   atorvastatin  40 mg Per Tube Daily   baclofen  10 mg Per Tube 3 times per day   chlorhexidine gluconate (MEDLINE KIT)  15 mL Mouth Rinse BID   Chlorhexidine Gluconate Cloth  6 each Topical Daily   diclofenac Sodium  4 g Topical QID   doxazosin  2 mg Per Tube Daily   feeding supplement (PROSource TF)  45 mL Per Tube Daily   fiber  1 packet Per Tube BID   free water  200 mL Per Tube Q4H   hydrALAZINE  50 mg Per Tube Q8H   insulin aspart  0-9 Units Subcutaneous Q4H   mouth rinse  15 mL Mouth Rinse 5 X Daily   metoprolol tartrate  100 mg Per Tube BID   modafinil  200 mg Per Tube Daily   tiZANidine  4 mg Per Tube QHS   traMADol  25 mg Per Tube Q6H  Continuous Infusions:  sodium chloride Stopped (10/26/20 1445)   feeding supplement (OSMOLITE 1.2 CAL) 1,000 mL (12/29/20 1232)   Labs: CBGs [id number] Diet Order:   Diet Order             Diet NPO time specified  Diet effective now                  EDUCATION NEEDS:  No education needs have been  identified at this time  Skin:  Skin Assessment: Reviewed RN Assessment (MASD bilateral groin)  Last BM:  6/12 type 5  Height:  Ht Readings from Last 1 Encounters:  10/18/20 5' 5" (1.651 m)   Weight:  Wt Readings from Last 1 Encounters:  12/29/20 53.6 kg   Ideal Body Weight:  56.8 kg  BMI:  Body mass index is 19.66 kg/m.  Estimated Nutritional Needs:  Kcal:  1600-1800 Protein:  80-90 grams Fluid:  >1.6L/d    Larkin Ina, MS, RD, LDN RD pager number and weekend/on-call pager number located in [place].

## 2020-12-31 NOTE — Progress Notes (Signed)
Discussed Charlene [name]'s care with her husband at bedside. He is interested in working towards decannulation this admission, but only if we feel that it is safe. He understands that I think she is likely weeks to months away from decannulation, if possible at all based on her trajectory of very slow improvement this hospital stay. He has requested that we follow up in 2 weeks to reassess her. No current plans for decannulation.  Steffanie Dunn, DO 12/31/20 1:03 PM [facility]

## 2020-12-31 NOTE — Progress Notes (Signed)
RT NOTE: RT and Dr. Chestine Spore assessed patient. RT suctioned patient out and didn't get any secretions, pt did cough but very weak and non-productive. RT and MD discussed decannulation and decided pt needed more time, but she is improving. If pt was to get decannuled rt doesn't feel like patient would have an effective enough cough to protect airway if secretions were to increase.  According to MD pt didn't have any cough yesterday and was very weak and sleepy, but today she did and had improved. RT will continue to monitor.

## 2020-12-31 NOTE — Progress Notes (Addendum)
[facility] PROGRESS NOTE  NELWYN HEBDON TOI:[id number] DOB: 1965/12/23 DOA: 08/26/2020 PCP: Patient, [name] (Inactive)    October 08, 2020     10/16/2020                        10/16/2020     10/18/2020                        10/20/2020      12/30/2020                   12/30/2020   Status: Remains inpatient appropriate because:Altered mental status and Unsafe d/c plan: Requirement to discharge to trach capable SNF   Dispo: The patient is from: Home              Anticipated d/c is to: SNF trach capable[place]-trach team re eval 6/3 and still rec NOT to decannulate 2/2 menattion              Patient currently is not medically stable to d/c.   Difficult to place patient Yes   Level of care: Telemetry Medical  Code Status: Full Family Communication: husband Charlene [name] [phone]) is her POA and legal contact.  [name] was at bedside on 5/18 and given update.  [name] at bedside 6/6 DVT prophylaxis: Chronically bedbound.  5/6: We will discontinue prophylactic Lovenox   Vaccination status: Unvaccinated-husband still undecided.  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  Subsequently on 2/10, she developed obtundation and respiratory arrest leading to intubation.  She has remained persistently obtunded, now has a trach and a PEG.  Hospital course complicated by persistent fever, Serratia pneumonia and now pre-orbital  cellulitis  As of 3/25 with adjustments in hypertonicity medications patient began to improve regarding hypertonicity.  Eventually started on provigil with improvements in alertness.  Evaluated by rehab physician who recommended increasing provigil dose further.  Subsequently patient has become more alert.  Most days she is able to track and with encouragement able to follow simple commands especially since partial obstruction glasses placed.  See progress note dictated 4/27 for expanded details.   2/09 Admit for Charlene [name] 2/10 EVD placed, later intubated due to obtundation and respiratory arrest 2/11 MRI Brain, hypertonic saline d/c'd, EVD not working.  Husband did not consent to PICC  2/13 EVD removed 2/17 [place] discussion with family, PMT 2/24 Bedside trach and PEG 3/1 Transferred to [facility]  Subjective: Patient alert and staring at photographs of family at bedside.  She did track to personnel in room during exam and during conversation.  Purposefully grabbed at my stethoscope and grimaced during abdominal exam.  Objective: Vitals:   12/31/20 0418 12/31/20 0442  BP: 111/68   Pulse: 82 78  Resp: 14 15  Temp: 98 F (36.7 C)   SpO2: 99% 98%    Intake/Output Summary (Last 24 hours) at 12/31/2020 0809 Last data  filed at 12/31/2020 0419 Gross per 24 hour  Intake --  Output 1700 ml  Net -1700 ml    Filed Weights   12/27/20 0400 12/28/20 0441 12/29/20 0500  Weight: 53.5 kg 53.4 kg 53.6 kg    Exam:       Gen: Alert.  Tracking.  No acute distress until abdomen          palpated Respiratory: #4.0 cuffless trach, trach collar with FiO2 21% at 5L, lungs are clear.  No tracheal secretions. Cardiovascular: No heart sounds, normotensive, no peripheral edema Abdomen:  PEG for tube feeding-LBM 6/12.  Soft but slightly more distended today with proactive bowel sounds.  Diffusely tender without guarding or rebounding with palpation. Musculoskeletal: Bilateral atrophy of thigh and calf muscles-continues  to have some right knee pain that is worse with extension Neurologic: Purposeful movement of right upper extremity during exam today.  No attempts to phonate.  Tracks with both eyes.  Still has some disconjugate gaze and left eye but is able to track.  Currently wearing partial obstruction glasses Psych: Alert.  Nonverbal so unable to accurately assess orientation   Assessment/Plan: Acute problems: Multiple acute strokes  Right Thalamus ICH with IVH  Obstructive Hydrocephalus/Acute on persistent metabolic Encephalopathy/brain injury related hypertonicity -Continue baclofen and tizanidine for spasticity. Continue Provigil and therapies -Reevaluated by CIR on 5/2 with SNF recommended based on lack of any meaningful improvement at that time   **The following are the areas of her stroke:corona radiata, midbrain with associated midline shift, right basal ganglia.  Despite making positive strides in other areas such as tracking with her eyes, intermittent phonation, ability to swallow and eat pures when assisted and localization of pain these " mental status" gains do not correlate with necessary ability to initiate both voluntary and involuntarily cough which is necessary to be safely decannulated.  Given midbrain involvement as well as other structural changes from midline shift it is reasonable to suspect that some of her involuntary responses such as cough reflex have been negatively impacted and this unfortunately could represent a permanent injury ie additional brainstem involvement.  This is evidenced by recent attempts to provoke a cough reflex with deep suctioning without success.  As hopeful as we are that Charlene [name] would be able to recover to the point that she can be decannulated given her significant brain injury and slow recovery she is not a candidate to be safely decannulated at this time. -On 6/15 patient was reevaluated by both respiratory therapy and the tracheostomy team.  Even with deep  suctioning patient did not elicit a cough and no spontaneous coughing was elicited.  I agree with the assessment that patient is not safe for decannulation -6/16 [name] d/w her husband. Plan is to re evaluate efficacy of decannulation in 2 weeks; at best she is weeks to months away from safe decannulation. Her very slow recovery trajectory is concerning for requirement for permanent trach/no decannulation. Of note, she was suctioned per RT with very weak cough noted-no secretions.  Colonic Ileus/recurrent constipation/loose stools -6/6 ileus resolved but as of 6/16 abdomen appears bloated and with exam patient appears to be uncomfortable noting with palpation she grabs my stethoscope. -KUB today reveals retained stool in rectum.  Will give SMOG enema x1 -Continue Miralax prn and fiber supplementation  Hypertensive emergency/Malignant HTN -Continue Norvasc, Lopressor and hydralazine.   Acute hypoxemic respiratory failure secondary to CVA requiring mechanical ventilation (Resolved) Trach dependent -See above regarding brain injury negatively impacting ability to be  decannulated  Dysphagia -Continue tube feedings, Prosource liquid, free water -Continue supervised pured diet allowing family to administer orals after adequate training  History of urinary retention -Continue Cardura      Other problems: Right upper extremity thrombophlebitis - [name] -> age indeterminate SVT of right cephalic vein  Neck Contracture/torticollis -- Resolved  ESBL positive Enterobacter UTI -Has completed 5 days of meropenem -Contact isolation during hospitalization -continue Oxybutynin suspected bladder spasms  Klebsiella UTI -Resistant to nitrofurantoin and ampicillin -Meropenem transitioned to Rocephin on 5/22 -To minimize risk of recurrent UTI have asked that pure wick only be used at bedtime on a 10 PM to 8 AM schedule  Recurrent diarrhea -Resolved -Likely combination of recent antibiotics as  well as scheduled laxative -MiraLAX now as needed  Bacterial Conjunctivitis /periorbital cellulitis -Resolved  Chronic pansinusitis/recurrent fevers 2/2 aspiration PNA;History of Serratia Pneumonia  Hospital Acquired Pneumonia:  -Completed several courses of antibiotic -Follow-up imaging including sinus CT consistent with chronic but stable and resolving sinusitis   Anemia -Stable  HLD -Continue Lipitor  Bilateral knee osteoarthritis/reported ligamentous injury to right knee/right hip pain -Has completed a short course of prednisone, also treated with scheduled Tylenol and NSAIDs. -History of remote MCL tear. CT right knee on 6/9 with mild tricompartmental osteoarthritis with small joint effusion with findings confirming likely torn MCL -Continue scheduled Ultram noting she appears more comfortable today -Voltaren gel to knees and hip -As of 6/14 pain appears to be controlled    Data Reviewed: Basic Metabolic Panel: No results for input(s): NA, K, CL, CO2, GLUCOSE, BUN, CREATININE, CALCIUM, MG, PHOS in the last 168 hours.  Liver Function Tests: No results for input(s): AST, ALT, ALKPHOS, BILITOT, PROT, ALBUMIN in the last 168 hours.  CBC: No results for input(s): WBC, NEUTROABS, HGB, HCT, MCV, PLT in the last 168 hours.   CBG: Recent Labs  Lab 12/30/20 1602 12/30/20 2043 12/30/20 2337 12/31/20 0417 12/31/20 0743  GLUCAP 118* 111* 176* 86 127*     Studies: No results found.   Scheduled Meds:  amLODipine  10 mg Per Tube Daily   atorvastatin  40 mg Per Tube Daily   baclofen  10 mg Per Tube 3 times per day   chlorhexidine gluconate (MEDLINE KIT)  15 mL Mouth Rinse BID   Chlorhexidine Gluconate Cloth  6 each Topical Daily   diclofenac Sodium  4 g Topical QID   doxazosin  2 mg Per Tube Daily   feeding supplement (PROSource TF)  45 mL Per Tube Daily   fiber  1 packet Per Tube BID   free water  200 mL Per Tube Q4H   hydrALAZINE  50 mg Per Tube Q8H   insulin  aspart  0-9 Units Subcutaneous Q4H   mouth rinse  15 mL Mouth Rinse 5 X Daily   metoprolol tartrate  100 mg Per Tube BID   modafinil  200 mg Per Tube Daily   tiZANidine  4 mg Per Tube QHS   traMADol  25 mg Per Tube Q6H   Continuous Infusions:  sodium chloride Stopped (10/26/20 1445)   feeding supplement (OSMOLITE 1.2 CAL) 1,000 mL (12/29/20 1232)    Principal Problem:   ICH (intracerebral hemorrhage) ([place]) Active Problems:   Acute hypoxemic respiratory failure (HCC)   Tracheostomy dependent (HCC)   Palliative care by specialist   Muscle hypertonicity   Oropharyngeal dysphagia   Poor prognosis   Aspiration pneumonia of right lower lobe ([place])   Infection due to 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  Palliative care.   PCCM continues to follow.  No decannulation.   Procedures: Tracheostomy PEG tube EEG Echocardiogram  Antibiotics: Vancomycin 2/12-2/15 Maxipime 2/12-2/15 Ancef 2/19-2/27 Vancomycin 2/20 x 1 dose Maxipime 2/27- 3/5 Vancomycin 3/1-3/3 Ancef 3/6-3/14 Cipro 3/20 4-3/30 Unasyn 3/30-4/4 Rocephin 4/11-4/12 Meropenem 4/12-4/17   Time spent: 25 minutes    Erin Hearing ANP  [facility] 7 am - 330 pm/M-F for direct patient care and secure chat Please refer to [place] for contact info 126  Days

## 2020-12-31 NOTE — Progress Notes (Signed)
NAME:  Charlene [name], MRN:  [id number], DOB:  09/10/1965, LOS: 126 ADMISSION DATE:  08/26/2020, CONSULTATION DATE:  2/10 REFERRING MD:  Roda Shutters, CHIEF COMPLAINT:  headache   History of Present Illness:  55 year old female admitted with hypertensive R thalamic/basal ganglia ICH. Resultant obstructive hydrocephalus prompting EVD placement 2/10. Subsequently, 2/10 she had acute mental status change to obtundation and respiratory arrest resulting in intubation. Trach and Peg 2/24. Trach change to #6 cuffless on 3/11.   Pertinent  Medical History  Hypertension Medication non-compliance  Significant Hospital Events: Including procedures, antibiotic start and stop dates in addition to other pertinent events   2/09 Admit for ICH 2/10 EVD placed, later intubated due to obtundation and respiratory arrest 2/11 MRI Brain, hypertonic saline d/c'd, EVD not working.  Husband did not consent to PICC  2/13 EVD removed 2/17 GOC discussion with family, PMT 3/11 trach changed to [place] 6 uncuffed 4/18 28% ATC / 5L  5/8 downsized trach to 4 5/16 tolerating z 4 well. No real functional improvements since 5/8 5/ 23 no improvement in mental status 5/20 no improvement of mental status   ETT 2/10 >> 09/10/2020 EVD 2/10 > non functioning 2/11 at 1500; clamped 2/12 am > 2/13 Trach (Dr. Denese Killings) 2/24 >>  PEG placement (Dr. Sheliah Hatch) 2/24 >>  CT Head 2/9 >> Acute hemorrhage with the epicenter in the right thalamus. Measurement is approximately 3.2 x 2.1 x 2.5 cm (volume 8.8 cm^3). Intraventricular penetration with blood filling the third and fourth ventricles and nearly filling the right lateral ventricle. No hydrocephalus. CTA Head/Neck 2/10 >> Unchanged size of intraparenchymal hemorrhage centered in the right thalamus and basal ganglia with extension into the right lateral ventricle. 2. No intracranial arterial occlusion or high-grade stenosis. No dissection, aneurysm or hemodynamically significant stenosis of  the carotid or vertebral arteries. CT Head 2/10 >> Stable parenchymal hemorrhage in the right thalamus and basal ganglia with intraventricular extension as described. No new focal abnormality is noted. CT Head 2/10 (post intubation) >> redemonstrated intraparenchymal hemorrhage centered within the R thalamus/basal ganglia extending into the cerebral peduncle/midbrain with associated intraventricular extension and hemorrhage in the R lateral ventricle and fourth ventricle, overall not significantly changed from prior; stable mass effect, 60mm of R to L midline shift, postsurgical changes of ventriculostomy TTE 2/10 >> LVEF 60 to 65%, LV has normal function, no regional wall motion abnormalities, mild concentric LVH.  RV systolic function is normal, normal pulmonary artery systolic pressure.  MRA head 2/11 >>Negative intracranial MR angiography. No evidence of aneurysm or high flow vascular malformation to explain the intraparenchymal hemorrhage  MRI brain 2/11 >> Similar intraparenchymal hemorrhage centered within the right thalamus and basal ganglia extending into the right midbrain with intraventricular extension, effacement of the prepontine cistern and similar 4 mm of leftward midline shift; separate areas of restricted diffusion and edema involving the R > L mesial temporal lobes/parahippocampal cortex, bilateral basal ganglia, and possibly the upper pons that are c/f acute infarcts, most likely secondary to mass effect from hemorrhage. MRI brain 2/19 >> intraventricular clot has decreased in size but mid-brain parenchymal clot has not and there are more ischemic infarcts in the surrounding brain.  CT Head WO Contrast 09/07/20>> Expected evolution of intracranial hemorrhage since 08/30/2020 head CT. Multiple areas of low attenuation corresponding to some of the infarcts seen on prior MRI. Ventricle caliber similar to prior MRI  Interim History / Subjective:  Asked by Hospitalist LOS medical advisor for  reassessment of tracheostomy need and  consideration for decannulation on 6/15. Daughter in law present and bedside and had reported that Ms. Mauch was more sleepy and had not been able to work with SLP. She requested that I speak to Ms. Cronce's husband regarding decisions for decannulation and resuming trach capping trials. She did not need rescue suctioning overnight and has not in some time. She uses her PMV with supervision, but was not wearing it yesterday during her exam.   Objective   Blood pressure 111/68, pulse 78, temperature 98 F (36.7 C), temperature source Oral, resp. rate 15, height 5\' 5"  (1.651 m), weight 53.6 kg, last menstrual period 03/03/2014, SpO2 98 %, unknown if currently breastfeeding.    FiO2 (%):  [21 %] 21 %   Intake/Output Summary (Last 24 hours) at 12/31/2020 0823 Last data filed at 12/31/2020 0419 Gross per 24 hour  Intake --  Output 1700 ml  Net -1700 ml    Filed Weights   12/27/20 0400 12/28/20 0441 12/29/20 0500  Weight: 53.5 kg 53.4 kg 53.6 kg    Examination:  General: chronically ill appearing woman sitting up in bed, more awake and alert. Glasses on, working with PT. HEENT: [place]/AT, eyes anicteric Neck: trach in place, no bleeding or drainage Cards: S1S2, RRR Resp: breathing comfortably on TC, CTAB. No secretions when suctioned through trach during exam. Abd: soft, ND Ext: no significant edema, muscle wasting. Neuro: more awake and alert today, tracks to the R. Slowly follows commands to reach her right arm up in the air but unable to follow commands to open her mouth, stick out tongue, or cough despite repeat attempts and mimicking for her. No pharyngeal gag with suctioning, but does grimace. Tracheal suctioning produced more coughs, but still slightly delayed and very weak.   Labs/imaging that I havepersonally reviewed  (right click and "Reselect all SmartList Selections" daily)  Resp culture 12/17/20: K. Pneumoniae 6/3 Swallow study: mild  posterior tracheal aspiration with thin liquids with delayed cough reflex. Mild difficulty with oral control, piecemeal bolusing of liquids into pharynx. Pharyngeal swallow consistently triggered at pyriforms. Consistent eiglottic closure over larynx when swallow triggered. Excellent pharyngeal squeeze. Occasional trace penetrations of liquids over the arytenoids with hovering over inferior vocal folds.   No updated labs for review.   Resolved Hospital Problem list     Assessment & Plan:  Tracheostomy dependence post CVA Severe deconditioning Muscle wasting Poor cough mechanics persist  Discussion She has tolerated capping trials previously this admission. Although she has not required rescue suctioning recently, her baseline waxing and waning mental status, weakness, inability to follow multiple commands regarding managing her upper respiratory tract, and poor cough strength preclude decannulation at this time. Discussed with RT during assessment, who agrees.  I think she is very high risk for reintubation in the future because her cough would be ineffective at clearing her airway sufficiently to avoid reintubation if she had a significant aspiration event. She remains high risk for significant aspiration with her mental status and difficulty following commands at this stage her in her recovery. Hopefully this will improve in the future, but her trajectory of recovery suggests this is still weeks to months away. I will discuss with her husband when he arrives today. Any decision to decannulate should be with minimum 1 week repeat capping trial and ongoing discussion with her family of risks, benefits, alternatives to decannulation. Reintubations in the future would be high risk for failure due to difficult airway anatomy post-trach and would likely be a setback in  her progress.    Plan -Con't routine trach care, PMV when supervised. Appreciate SLP's assistance.  -No plans for decannulation  currently.  -Ongoing [facility] discussions with family. They have been consistently in favor of aggressive care.     Best practice (right click and "Reselect all SmartList Selections" daily)  Per primary   Steffanie Dunn, DO 12/31/20 9:32 AM [facility]  For contact information, see Amion. If no response to pager, please call PCCM consult pager. After hours, 7PM- 7AM, please call [place].

## 2020-12-31 NOTE — Progress Notes (Signed)
Occupational Therapy Treatment Patient Details Name: Charlene [name] MRN: [id number] DOB: 1965-09-15 Today's Date: 12/31/2020    History of present illness Pt is 55 y/o female presents to [facility] on 08/26/20 with L sided weakness and headaches. 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 s/p Right frontal ventricular catheter placement on 2/10, stopped functioning on 2/11.  MRI 2/11 shows right greater than left mesial temporal lobes / parahippocampal cortex, bilateral basal ganglia, and possibly the upper pons that are concerning for acute infarcts, most likely secondary to mass effect from the hemorrhage. ETT 2/10-2/24.  Pt with trach and PEG on 09/10/20. Hospital course complicated by persistent fever, Serratia pneumonia and now pre-orbital cellulitis.   PMH includes anxiety, HTN.   OT comments  Engaged in supine lumbar ROM, transitioned to EOB with total assist.  Sitting EOB noted increased control of R UE but utilizing R UE to push towards L side when she had access to the bed, with R UE in lap relaxed increased extensor tone and heavy posterior lean. Returned to supine, chair position to engage in ADLs. Pt scanning with partial occulusion glasses, following simple commands but requires increased time with R UE (to touch my hand, thumbs up) and multimodal cueing/hand over hand support to engage in ADLs (applying lotion). RN present in room upon exit.     Follow Up Recommendations  SNF;Supervision/Assistance - 24 hour    Equipment Recommendations  None recommended by OT    Recommendations for Other Services      Precautions / Restrictions Precautions Precautions: Fall Precaution Comments: peg and trach Required Braces or Orthoses: Other Brace Other Brace: RLE soft knee brace and LUE soft elbow brace (wearing schedule in RN orders); bil  PRAFOs Restrictions Weight Bearing Restrictions: No       Mobility Bed Mobility Overal bed mobility: Needs Assistance Bed Mobility: Rolling;Sidelying to Sit;Sit to Sidelying Rolling: Total assist Sidelying to sit: Total assist     Sit to sidelying: Total assist;+2 for physical assistance;+2 for safety/equipment General bed mobility comments: total assist to transition to EOB, returned to supine with total assist +2 (RN assist) due to increased extensor tone and pushing. Rolling in bed total assist to adjust bed pads, then positioned pt into chair position.    Transfers                 General transfer comment: deferred    Balance Overall balance assessment: Needs assistance Sitting-balance support: Single extremity supported;Feet supported;No upper extremity supported Sitting balance-Leahy Scale: Poor Sitting balance - Comments: EOB with max assist, pt pushing with R UE on bed to L side; when R UE placed in lap heavy posterior lean. Max assist to correct back to midline. Postural control: Posterior lean;Other (comment) (pushing towards L)                                 ADL either performed or assessed with clinical judgement   ADL Overall ADL's : Needs assistance/impaired     Grooming: Maximal assistance;Bed level Grooming Details (indicate cue type and reason): hax hand over hand assist to initate rubbing lotion to L UE, once initated pt able to engage and complete task with increased time  Functional mobility during ADLs: Total assistance;+2 for physical assistance;+2 for safety/equipment General ADL Comments: EOB with increased pushing towards L side using R UE, when R UE relaxed pt with heavy posterior lean; returned to supine to engage in ADLs in chair position     Vision   Additional Comments: scanning to  L and R today, wearing partial occlusion glasses with good depth perception noted when reaching to  touch therapist with R UE   Perception     Praxis      Cognition Arousal/Alertness: Awake/alert Behavior During Therapy: Flat affect Overall Cognitive Status: Impaired/Different from baseline Area of Impairment: Attention;Following commands;Problem solving                   Current Attention Level: Focused   Following Commands: Follows one step commands inconsistently;Follows one step commands with increased time     Problem Solving: Slow processing;Decreased initiation;Difficulty sequencing;Requires verbal cues;Requires tactile cues General Comments: pt alert upon entry, following simple commands with increased time (thumbs up, touch my hand) and engaging in ADLs (applying lotion) with mulitmodal cueing.        Exercises Exercises: Other exercises;General Upper Extremity General Exercises - Upper Extremity Shoulder Flexion: PROM;10 reps;Left Shoulder ABduction: PROM;10 reps;Left Elbow Flexion: PROM;10 reps;Left Elbow Extension: PROM;Left;10 reps Other Exercises Other Exercises: Gentle lumbar rotation stretch with B hips and knees flexed   Shoulder Instructions       General Comments removed R knee pillow splint, repositioned in chair positoin at end of session with pillows under L UE for maximal comfort    Pertinent Vitals/ Pain       Pain Assessment: Faces Faces Pain Scale: No hurt  Home Living                                          Prior Functioning/Environment              Frequency  Min 2X/week        Progress Toward Goals  OT Goals(current goals can now be found in the care plan section)  Progress towards OT goals: Progressing toward goals  Acute Rehab OT Goals Patient Stated Goal: Pt unable to state OT Goal Formulation: Patient unable to participate in goal setting Time For Goal Achievement: 01/11/21 Potential to Achieve Goals: Fair  Plan Frequency remains appropriate;Discharge plan remains appropriate     Co-evaluation                 AM-PAC OT "6 Clicks" Daily Activity     Outcome Measure   Help from another person eating meals?: A Lot Help from another person taking care of personal grooming?: A Lot Help from another person toileting, which includes using toliet, bedpan, or urinal?: Total Help from another person bathing (including washing, rinsing, drying)?: Total Help from another person to put on and taking off regular upper body clothing?: Total Help from another person to put on and taking off regular lower body clothing?: Total 6 Click Score: 8    End of Session Equipment Utilized During Treatment: Oxygen  OT Visit Diagnosis: Muscle weakness (generalized) (M62.81);Apraxia (R48.2);Cognitive communication deficit (R41.841);Hemiplegia and hemiparesis Symptoms and signs involving cognitive functions: Other Nontraumatic ICH Hemiplegia - Right/Left: Right 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

## 2021-01-01 DIAGNOSIS — K5903 Drug induced constipation: Secondary | ICD-10-CM

## 2021-01-01 LAB — GLUCOSE, CAPILLARY
Glucose-Capillary: 111 mg/dL — ABNORMAL HIGH (ref 70–99)
Glucose-Capillary: 118 mg/dL — ABNORMAL HIGH (ref 70–99)
Glucose-Capillary: 126 mg/dL — ABNORMAL HIGH (ref 70–99)
Glucose-Capillary: 129 mg/dL — ABNORMAL HIGH (ref 70–99)
Glucose-Capillary: 132 mg/dL — ABNORMAL HIGH (ref 70–99)
Glucose-Capillary: 134 mg/dL — ABNORMAL HIGH (ref 70–99)
Glucose-Capillary: 146 mg/dL — ABNORMAL HIGH (ref 70–99)
Glucose-Capillary: 96 mg/dL (ref 70–99)

## 2021-01-01 NOTE — TOC Progression Note (Signed)
Transition of Care [facility]) - Progression Note    Patient Details  Name: Charlene [name] MRN: [id number] Date of Birth: Sep 23, 1965  Transition of Care [facility] CM/SW Contact  Janae Bridgeman, RN Phone Number: 01/01/2021, 9:28 AM  Clinical Narrative:    CM spoke with Selena Batten, CM at [facility] and she has agreed to review the patient's clinicals for possible admission.  Clinicals were given to DON at [facility] to review and consider for possible admission at this time.  The patient has a current bed offer in [place], [place] at this time but family is unwilling to accept the bed offer and give permission for admission to the facility considering the "long distance from [place]".  The patient has a pending court date with [facility] at this time - pending for January 28, 2021.  I updated Gretta Cool, MSW [facility] leadership and he is aware that [facility] is reviewing the patient's clinicals at this time.  CM will continue to follow the patient for [facility] needs and SNF placement.   Expected Discharge Plan: Skilled Nursing Facility Barriers to Discharge: Continued Medical Work up  Expected Discharge Plan and Services Expected Discharge Plan: Skilled Nursing Facility In-house Referral: Clinical Social Work Discharge Planning Services: CM Consult Post Acute Care Choice: Skilled Nursing Facility Living arrangements for the past 2 months: Single Family Home                                       Social Determinants of Health (SDOH) Interventions    Readmission Risk Interventions Readmission Risk Prevention Plan 09/29/2020  Transportation Screening Complete  PCP or Specialist Appt within 3-5 Days Complete  HRI or Home Care Consult Complete  Social Work Consult for Recovery Care Planning/Counseling Complete  Palliative Care Screening Complete  Medication Review Oceanographer) Complete  Some recent data might be hidden

## 2021-01-01 NOTE — Progress Notes (Signed)
SLP Cancellation Note  Patient Details Name: Charlene [name] MRN: [id number] DOB: 25-Nov-1965   Cancelled treatment:   At first attempt pt was sleeping and her mother asked that I return later. Upon return, pt had had BM and was being lifted back into bed to be changed.  Assisted staff with transfer and provided [name] with pudding to feed [name] later this afternoon after being cleaned.   Chancie Lampert L. Samson [name], MA CCC/SLP Acute Rehabilitation Services Office number [phone] Pager [phone]       [name] [name] 01/01/2021, 1:23 PM

## 2021-01-01 NOTE — Plan of Care (Signed)
  Problem: Coping: Goal: Will identify appropriate support needs Outcome: Progressing   Problem: Health Behavior/Discharge Planning: Goal: Ability to manage health-related needs will improve Outcome: Progressing   Problem: Self-Care: Goal: Verbalization of feelings and concerns over difficulty with self-care will improve Outcome: Progressing Goal: Ability to communicate needs accurately will improve Outcome: Progressing   Problem: Nutrition: Goal: Risk of aspiration will decrease Outcome: Progressing   Problem: Intracerebral Hemorrhage Tissue Perfusion: Goal: Complications of Intracerebral Hemorrhage will be minimized Outcome: Progressing   Problem: Intracerebral Hemorrhage Tissue Perfusion: Goal: Complications of Intracerebral Hemorrhage will be minimized Outcome: Progressing   Problem: Ischemic Stroke/TIA Tissue Perfusion: Goal: Complications of ischemic stroke/TIA will be minimized Outcome: Progressing   Problem: Education: Goal: Knowledge of General Education information will improve Description: Including pain rating scale, medication(s)/side effects and non-pharmacologic comfort measures Outcome: Progressing   Problem: Health Behavior/Discharge Planning: Goal: Ability to manage health-related needs will improve Outcome: Progressing   Problem: Clinical Measurements: Goal: Ability to maintain clinical measurements within normal limits will improve Outcome: Progressing Goal: Will remain free from infection Outcome: Progressing Goal: Diagnostic test results will improve Outcome: Progressing Goal: Respiratory complications will improve Outcome: Progressing Goal: Cardiovascular complication will be avoided Outcome: Progressing   Problem: Coping: Goal: Level of anxiety will decrease Outcome: Progressing   Problem: Elimination: Goal: Will not experience complications related to bowel motility Outcome: Progressing Goal: Will not experience complications related  to urinary retention Outcome: Progressing   Problem: Pain Managment: Goal: General experience of comfort will improve Outcome: Progressing   Problem: Skin Integrity: Goal: Risk for impaired skin integrity will decrease Outcome: Progressing

## 2021-01-01 NOTE — Progress Notes (Signed)
Physical Therapy Treatment Patient Details Name: YAA DONNELLAN MRN: [id number] DOB: 10/28/1965 Today's Date: 01/01/2021    History of Present Illness Pt is 55 y/o female presents to [facility] on 08/26/20 with L sided weakness and headaches. 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 s/p Right frontal ventricular catheter placement on 2/10, stopped functioning on 2/11.  MRI 2/11 shows right greater than left mesial temporal lobes / parahippocampal cortex, bilateral basal ganglia, and possibly the upper pons that are concerning for acute infarcts, most likely secondary to mass effect from the hemorrhage. ETT 2/10-2/24.  Pt with trach and PEG on 09/10/20. Hospital course complicated by persistent fever, Serratia pneumonia and now pre-orbital cellulitis.   PMH includes anxiety, HTN.    PT Comments    Pt tolerated lift OOB to tilt in space WC well. BP dropped, but held stable at 110s/70s.  Per mother's report, she just had AM meds and this often makes her sleepy.  Vitals monitored closely and were stable throughout on RA.  Pt taken outside for change in stimulus, temperature, environment to help stimulate new reactions, attention, general tolerance of being upright and OOB.  Pt left up, but tilted back to attempt napping before SLP comes to see her.  PT will continue to follow acutely for safe mobility progression.  Follow Up Recommendations  SNF     Equipment Recommendations  Wheelchair (measurements PT);Wheelchair cushion (measurements PT);Hospital bed;Other (comment) (hoyer lift, 18x18 tilt in space WC)    Recommendations for Other Services       Precautions / Restrictions Precautions Precautions: Fall Precaution Comments: peg and trach Required Braces or Orthoses: Other Brace Other Brace: RLE soft knee brace and LUE soft elbow brace (wearing schedule in RN  orders); bil PRAFOs    Mobility  Bed Mobility Overal bed mobility: Needs Assistance Bed Mobility: Rolling Rolling: Total assist;+2 for physical assistance         General bed mobility comments: however pt did flex head and pull weakly with right arm.    Transfers Overall transfer level: Needs assistance               General transfer comment: total lift OOB to chair to help increase stimulation, regulate body to being upright and provide a way to get her outside for new and different stimuli.  Ambulation/Gait                 Psychologist, counselling mobility: Yes Wheelchair parts: Needs Engineer, materials Details (indicate cue type and reason): total assist to go outside in tilt in space WC, pt masked for hallways and unmasked outside.  Mother accompanying PT.  Portable monitor in place.  Modified Rankin (Stroke Patients Only)       Balance                                            Cognition Arousal/Alertness: Lethargic;Suspect due to medications Behavior During Therapy: Flat affect Overall Cognitive Status: Impaired/Different from baseline Area of Impairment: Attention;Following commands;Problem solving                   Current Attention Level: Focused   Following Commands: Follows  one step commands inconsistently;Follows one step commands with increased time     Problem Solving: Slow processing;Decreased initiation General Comments: Pt a bit sleepy this AM, mother reports she just had all of her AM meds.  BPs did dip, but were stable in sitting, so not likely the BP drop as she is arousable with stimuli.  She did initiate head flexion and arm pull when asked by PT to roll towards the left.      Exercises General Exercises - Upper Extremity Shoulder Flexion: PROM;5 reps;Seated Elbow Extension: PROM;5 reps;Seated Other Exercises Other Exercises: bil knee  extension stretch x 5 reps slow holds to encourage extension without flexion withdrawl to pain.    General Comments        Pertinent Vitals/Pain      Home Living                      Prior Function            PT Goals (current goals can now be found in the care plan section) Acute Rehab PT Goals Patient Stated Goal: Pt unable to state Progress towards PT goals: Progressing toward goals    Frequency    Min 2X/week      PT Plan Current plan remains appropriate    Co-evaluation              AM-PAC PT "6 Clicks" Mobility   Outcome Measure  Help needed turning from your back to your side while in a flat bed without using bedrails?: Total Help needed moving from lying on your back to sitting on the side of a flat bed without using bedrails?: Total Help needed moving to and from a bed to a chair (including a wheelchair)?: Total Help needed standing up from a chair using your arms (e.g., wheelchair or bedside chair)?: Total Help needed to walk in hospital room?: Total Help needed climbing 3-5 steps with a railing? : Total 6 Click Score: 6    End of Session   Activity Tolerance: Patient limited by fatigue;Patient limited by lethargy Patient left: in chair;with call bell/phone within reach;with family/visitor present Nurse Communication: Mobility status;Need for lift equipment PT Visit Diagnosis: Hemiplegia and hemiparesis;Other symptoms and signs involving the nervous system (R29.898);Other abnormalities of gait and mobility (R26.89) Hemiplegia - Right/Left: Right Hemiplegia - dominant/non-dominant: Dominant Hemiplegia - caused by: Cerebral infarction;Nontraumatic intracerebral hemorrhage;Other Nontraumatic intracranial hemorrhage     Time: 1660-6004 PT Time Calculation (min) (ACUTE ONLY): 66 min  Charges:  $Therapeutic Activity: 53-67 mins                     Corinna Capra, PT, DPT  Acute Rehabilitation Ortho Tech Supervisor [phone]  pager [phone]) [phone] office

## 2021-01-01 NOTE — Progress Notes (Signed)
[facility] PROGRESS NOTE  ALAIA LORDI QRF:[id number] DOB: [birth year] DOA: 08/26/2020 PCP: Patient, [name] (Inactive)    October 08, 2020     10/16/2020                        10/16/2020     10/18/2020                        10/20/2020      12/30/2020                   12/30/2020   Status: Remains inpatient appropriate because:Altered mental status and Unsafe d/c plan: Requirement to discharge to trach capable SNF   Dispo: The patient is from: Home              Anticipated d/c is to: SNF trach capable[place]-we have recently discovered that the [facility] may have a trach bed available in clinicals have been sent to the facility for review -trach team re eval 6/3 and still rec NOT to decannulate 2/2 and lack of appropriate voluntary and involuntary cough response              Patient currently is not medically stable to d/c.   Difficult to place patient Yes   Level of care: Telemetry Medical  Code Status: Full Family Communication: [name] [phone]) is her POA and legal contact.  [name] was at bedside on 5/18 and given update.  [name] at bedside 6/6; mother at bedside multiple times this week with the most recent update on 6/17 DVT prophylaxis: Chronically bedbound.  5/6: We will discontinue prophylactic Lovenox   Vaccination status: Unvaccinated-husband still undecided.  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  Subsequently on 2/10, she developed obtundation and respiratory arrest leading to intubation.  She has remained persistently obtunded, now has a trach and a PEG.  Hospital course complicated by persistent fever, Serratia pneumonia and now pre-orbital cellulitis  As of 3/25 with adjustments in hypertonicity medications patient began to improve regarding hypertonicity.  Eventually started on provigil with improvements in alertness.  Evaluated by rehab physician who recommended increasing provigil dose further.  Subsequently patient has become more alert.  Most days she is able to track and with encouragement able to follow simple commands especially since partial obstruction glasses placed.  See progress note dictated 4/27 for expanded details.   2/09 Admit for [place] 2/10 EVD placed, later intubated due to obtundation and respiratory arrest 2/11 MRI Brain, hypertonic saline d/c'd, EVD not working.  Husband did not consent to PICC  2/13 EVD removed 2/17 [place] discussion with family, PMT 2/24 Bedside trach and PEG 3/1 Transferred to [facility]  Subjective: Initially was sleeping soundly and did not awaken with my examination.  Later mother came into room and patient awakened.  Objective: Vitals:   01/01/21 0431 01/01/21 0525  BP: 134/77  Pulse: 87 81  Resp: 14 16  Temp: 98.1 F (36.7 C)   SpO2: 99% 99%    Intake/Output Summary (Last 24 hours) at 01/01/2021 0800 Last data filed at 01/01/2021 6144 Gross per 24 hour  Intake --  Output 600 ml  Net -600 ml    Filed Weights   12/27/20 0400 12/28/20 0441 12/29/20 0500  Weight: 53.5 kg 53.4 kg 53.6 kg    Exam:       Gen: Awakened from sleep.  Initially with frown on her face, facial expression returned back to her normal baseline.  She is in no acute distress Respiratory: #4.0 cuffless trach, trach collar with FiO2 21% at 5L, lungs remain clear, no increased work of breathing.  No spontaneous cough.  Note patient has  awakened with mouthful of saliva (I have noticed on the past 3 days upon evaluating her after just awakening her) Cardiovascular: Normal heart sounds, normotensive, no peripheral edema Abdomen:  PEG for tube feeding-LBM 6/12.  Soft and nondistended.  No documentation as to whether patient had a bowel movement after smog enema on 6/16 Musculoskeletal: Bilateral atrophy of thigh and calf muscles-continues to have some right knee pain that is worse with extension Neurologic: Tracking with eyes.  Just awakened so very slow to respond with purposeful movement of right upper extremity.  Limited to no movement of left upper extremity.  Documented extremely poor cough reflex with suctioning on 6/16 Psychiatric: Awake and nonverbal so unable to assess orientation effectively   Assessment/Plan: Acute problems: Multiple acute strokes  Right Thalamus ICH with IVH  Obstructive Hydrocephalus/Acute on persistent metabolic Encephalopathy/Non TBI related hypertonicity -Continue baclofen and tizanidine for spasticity. Continue Provigil and therapies -Reevaluated by CIR on 5/2 with SNF recommended based on lack of any meaningful improvement at that time  -Her brain injury involves corona radiata, midbrain with associated midline shift and right basal ganglia.  These areas correlate with awareness and purposeful movement  Tracheostomy dependent -Currently on a 4.0 cuffless trach with humidified air -Respiratory status in regards to provoked and spontaneous coughing definitely impacted by patient's level of mentation and fatigue -Provoked via deep suction completed while patient fatigued demonstrated no evidence of cough or gag  -The following day while patient more alert provoked cough via deep suction patient demonstrated very weak cough response -6/16 [name] d/w her husband. Plan is to re evaluate efficacy of decannulation in 2 weeks; at best she is weeks to months away from safe decannulation. Her very slow  recovery trajectory is concerning for requirement for permanent trach/no decannulation. Of note, she was suctioned per RT with very weak cough noted-no secretions were detected as well  Colonic Ileus/recurrent constipation/loose stools -KUB: retained stool in rectum.  Given SMOG enema x1 on 6/16 -Continue Miralax prn and fiber supplementation  Hypertensive emergency/Malignant HTN -Continue Norvasc, Lopressor and hydralazine.  Dysphagia -Continue tube feedings, Prosource liquid, free water -Continue supervised pured diet allowing family to administer orals after adequate training  History of urinary retention -Continue Cardura      Other problems: Right upper extremity thrombophlebitis - [name] -> age indeterminate SVT of right cephalic vein  Neck Contracture/torticollis -- Resolved  ESBL positive Enterobacter UTI -Has completed 5 days of meropenem -Contact isolation during hospitalization -continue Oxybutynin suspected bladder spasms  Klebsiella UTI -Resistant to nitrofurantoin and ampicillin -Meropenem transitioned to Rocephin on 5/22 -To minimize risk of recurrent UTI have asked that pure wick only be used at bedtime on a 10 PM to 8 AM  schedule  Recurrent diarrhea -Resolved -Likely combination of recent antibiotics as well as scheduled laxative -MiraLAX now as needed  Bacterial Conjunctivitis /periorbital cellulitis -Resolved  Chronic pansinusitis/recurrent fevers 2/2 aspiration PNA;History of Serratia Pneumonia  Hospital Acquired Pneumonia:  -Completed several courses of antibiotic -Follow-up imaging including sinus CT consistent with chronic but stable and resolving sinusitis   Anemia -Stable  HLD -Continue Lipitor  Bilateral knee osteoarthritis/reported ligamentous injury to right knee/right hip pain -Has completed a short course of prednisone, also treated with scheduled Tylenol and NSAIDs. -History of remote MCL tear. CT right knee on 6/9 with mild  tricompartmental osteoarthritis with small joint effusion with findings confirming likely torn MCL -Continue scheduled Ultram noting she appears more comfortable today -Voltaren gel to knees and hip -As of 6/14 pain appears to be controlled    Data Reviewed: Basic Metabolic Panel: No results for input(s): NA, K, CL, CO2, GLUCOSE, BUN, CREATININE, CALCIUM, MG, PHOS in the last 168 hours.  Liver Function Tests: No results for input(s): AST, ALT, ALKPHOS, BILITOT, PROT, ALBUMIN in the last 168 hours.  CBC: No results for input(s): WBC, NEUTROABS, HGB, HCT, MCV, PLT in the last 168 hours.   CBG: Recent Labs  Lab 12/31/20 1608 12/31/20 2032 01/01/21 0018 01/01/21 0035 01/01/21 0431  GLUCAP 104* 130* 126* 129* 96     Studies: DG Abd Portable 1V  Result Date: 12/31/2020 CLINICAL DATA:  Possible ileus EXAM: PORTABLE ABDOMEN - 1 VIEW COMPARISON:  12/21/2020 FINDINGS: Gastrostomy tube is present. Bowel gas pattern is unremarkable. Stool is primarily present within the rectum; increased density may reflect suggests prior contrast. IMPRESSION: Normal bowel gas pattern.  Retained stool in the rectum. Electronically Signed   By: Macy Mis M.D.   On: 12/31/2020 11:30     Scheduled Meds:  amLODipine  10 mg Per Tube Daily   atorvastatin  40 mg Per Tube Daily   baclofen  10 mg Per Tube 3 times per day   chlorhexidine gluconate (MEDLINE KIT)  15 mL Mouth Rinse BID   Chlorhexidine Gluconate Cloth  6 each Topical Daily   diclofenac Sodium  4 g Topical QID   doxazosin  2 mg Per Tube Daily   feeding supplement (PROSource TF)  45 mL Per Tube Daily   fiber  1 packet Per Tube BID   free water  200 mL Per Tube Q4H   hydrALAZINE  50 mg Per Tube Q8H   insulin aspart  0-9 Units Subcutaneous Q4H   mouth rinse  15 mL Mouth Rinse 5 X Daily   metoprolol tartrate  100 mg Per Tube BID   modafinil  200 mg Per Tube Daily   tiZANidine  4 mg Per Tube QHS   traMADol  25 mg Per Tube Q6H    Continuous Infusions:  sodium chloride Stopped (10/26/20 1445)   feeding supplement (OSMOLITE 1.2 CAL) 1,000 mL (12/29/20 1232)    Principal Problem:   ICH (intracerebral hemorrhage) ([place]) Active Problems:   Acute hypoxemic respiratory failure (HCC)   Tracheostomy dependent ([place])   Palliative care by specialist   Muscle hypertonicity   Oropharyngeal dysphagia   Poor prognosis   Aspiration pneumonia of right lower lobe ([place])   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   Acute cystitis without hematuria   Ileus, unspecified ([place])   Constipation   Consultants: Neurology  Neurosurgery.  Palliative care.   PCCM continues to follow.  No decannulation.   Procedures: Tracheostomy PEG tube  EEG Echocardiogram  Antibiotics: Vancomycin 2/12-2/15 Maxipime 2/12-2/15 Ancef 2/19-2/27 Vancomycin 2/20 x 1 dose Maxipime 2/27- 3/5 Vancomycin 3/1-3/3 Ancef 3/6-3/14 Cipro 3/20 4-3/30 Unasyn 3/30-4/4 Rocephin 4/11-4/12 Meropenem 4/12-4/17   Time spent: 25 minutes    Erin Hearing ANP  [facility] 7 am - 330 pm/M-F for direct patient care and secure chat Please refer to [place] for contact info 127  Days

## 2021-01-01 NOTE — Plan of Care (Signed)
  Problem: Nutrition: Goal: Risk of aspiration will decrease Outcome: Progressing   Problem: Intracerebral Hemorrhage Tissue Perfusion: Goal: Complications of Intracerebral Hemorrhage will be minimized Outcome: Progressing   Problem: Clinical Measurements: Goal: Ability to maintain clinical measurements within normal limits will improve Outcome: Progressing Goal: Will remain free from infection Outcome: Progressing Goal: Diagnostic test results will improve Outcome: Progressing Goal: Respiratory complications will improve Outcome: Progressing Goal: Cardiovascular complication will be avoided Outcome: Progressing   Problem: Coping: Goal: Level of anxiety will decrease Outcome: Progressing   Problem: Elimination: Goal: Will not experience complications related to bowel motility Outcome: Progressing Goal: Will not experience complications related to urinary retention Outcome: Progressing   Problem: Pain Managment: Goal: General experience of comfort will improve Outcome: Progressing   Problem: Skin Integrity: Goal: Risk for impaired skin integrity will decrease Outcome: Progressing   Problem: Intracerebral Hemorrhage Tissue Perfusion: Goal: Complications of Intracerebral Hemorrhage will be minimized Outcome: Progressing   Problem: Ischemic Stroke/TIA Tissue Perfusion: Goal: Complications of ischemic stroke/TIA will be minimized Outcome: Progressing   

## 2021-01-02 ENCOUNTER — Encounter (HOSPITAL_COMMUNITY): Payer: Self-pay | Admitting: Student in an Organized Health Care Education/Training Program

## 2021-01-02 LAB — GLUCOSE, CAPILLARY
Glucose-Capillary: 110 mg/dL — ABNORMAL HIGH (ref 70–99)
Glucose-Capillary: 143 mg/dL — ABNORMAL HIGH (ref 70–99)
Glucose-Capillary: 102 mg/dL — ABNORMAL HIGH (ref 70–99)
Glucose-Capillary: 86 mg/dL (ref 70–99)
Glucose-Capillary: 116 mg/dL — ABNORMAL HIGH (ref 70–99)

## 2021-01-02 NOTE — Plan of Care (Signed)
  Problem: Nutrition: Goal: Risk of aspiration will decrease Outcome: Progressing   Problem: Intracerebral Hemorrhage Tissue Perfusion: Goal: Complications of Intracerebral Hemorrhage will be minimized Outcome: Progressing   Problem: Clinical Measurements: Goal: Ability to maintain clinical measurements within normal limits will improve Outcome: Progressing Goal: Will remain free from infection Outcome: Progressing Goal: Diagnostic test results will improve Outcome: Progressing Goal: Respiratory complications will improve Outcome: Progressing Goal: Cardiovascular complication will be avoided Outcome: Progressing   Problem: Coping: Goal: Level of anxiety will decrease Outcome: Progressing   Problem: Elimination: Goal: Will not experience complications related to bowel motility Outcome: Progressing Goal: Will not experience complications related to urinary retention Outcome: Progressing   Problem: Pain Managment: Goal: General experience of comfort will improve Outcome: Progressing   Problem: Skin Integrity: Goal: Risk for impaired skin integrity will decrease Outcome: Progressing   Problem: Intracerebral Hemorrhage Tissue Perfusion: Goal: Complications of Intracerebral Hemorrhage will be minimized Outcome: Progressing   Problem: Ischemic Stroke/TIA Tissue Perfusion: Goal: Complications of ischemic stroke/TIA will be minimized Outcome: Progressing   

## 2021-01-02 NOTE — Progress Notes (Signed)
[facility] PROGRESS NOTE  [name] TAV:[id number] DOB: [birth year] DOA: 08/26/2020 PCP: Patient, [name] (Inactive)     Status: Remains inpatient appropriate because:Altered mental status and Unsafe d/c plan: Requirement to discharge to trach capable SNF   Dispo: The patient is from: Home              Anticipated d/c is to: SNF trach capable-[place] available-we have recently discovered that [facility] may have a trach bed available in clinicals have been sent to the facility for review -trach team re eval 6/3 and still rec NOT to decannulate 2/2 and lack of appropriate voluntary and involuntary cough response              Patient currently is not medically stable to d/c.   Difficult to place patient Yes   Level of care: Telemetry Medical  Code Status: Full Family Communication: husband Charlene [name] [phone]) is her POA and legal contact.  Charlene [name] was at bedside on 5/18 and given update.  Charlene [name] at bedside 6/6; mother at bedside multiple times this week with the most recent update on 6/17 DVT prophylaxis: Chronically bedbound.  5/6: We will discontinue prophylactic Lovenox   Vaccination status: Unvaccinated-husband still undecided.  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  Subsequently on 2/10, she developed obtundation and respiratory arrest leading to intubation.  She has remained persistently obtunded, now  has a trach and a PEG.  Hospital course complicated by persistent fever, Serratia pneumonia and now pre-orbital cellulitis  As of 3/25 with adjustments in hypertonicity medications patient began to improve regarding hypertonicity.  Eventually started on provigil with improvements in alertness.  Evaluated by rehab physician who recommended increasing provigil dose further.  Subsequently patient has become more alert.  Most days she is able to track and with encouragement able to follow simple commands especially since partial obstruction glasses placed.  See progress note dictated 4/27 for expanded details.   2/09 Admit for Charlene [name] 2/10 EVD placed, later intubated due to obtundation and respiratory arrest 2/11 MRI Brain, hypertonic saline d/c'd, EVD not working.  Husband did not consent to PICC  2/13 EVD removed 2/17 [place] discussion with family, PMT 2/24 Bedside trach and PEG 3/1 Transferred to [facility]  Subjective: Initially was sleeping soundly and did not awaken with my examination.  Later mother came into room and patient awakened.  Objective: Vitals:   01/02/21 0332 01/02/21 0353  BP:  125/79  Pulse: 85 86  Resp: 15 16  Temp:  99.7 F (37.6 C)  SpO2: 99% 92%    Intake/Output Summary (Last 24 hours) at 01/02/2021 0810 Last data filed at 01/01/2021 0921 Gross per 24 hour  Intake --  Output 450 ml  Net -450 ml     Filed Weights   12/27/20 0400 12/28/20 0441 12/29/20 0500  Weight: 53.5 kg 53.4 kg 53.6 kg    Exam: Gen: Awakened from sleep.  Initially with frown on her  face, facial expression returned back to her normal baseline.  She is in no acute distress Respiratory: #4.0 cuffless trach, trach collar with FiO2 21% at 5L, lungs remain clear, no increased work of breathing.  No spontaneous cough.  Note patient has awakened with mouthful of saliva (I have noticed on the past 3 days upon evaluating her after just awakening her) Cardiovascular: Normal heart sounds, normotensive, no  peripheral edema Abdomen:  PEG for tube feeding-LBM 6/12.  Soft and nondistended.  No documentation as to whether patient had a bowel movement after smog enema on 6/16 Musculoskeletal: Bilateral atrophy of thigh and calf muscles-continues to have some right knee pain that is worse with extension Neurologic: Tracking with eyes.  Just awakened so very slow to respond with purposeful movement of right upper extremity.  Limited to no movement of left upper extremity.  Documented extremely poor cough reflex with suctioning on 6/16 Psychiatric: Awake and nonverbal so unable to assess orientation effectively   Assessment/Plan: Acute problems: Multiple acute strokes  Right Thalamus ICH with IVH  Obstructive Hydrocephalus/Acute on persistent metabolic Encephalopathy/Non TBI related hypertonicity -Continue baclofen and tizanidine for spasticity. Continue Provigil and therapies -Reevaluated by CIR on 5/2 with SNF recommended based on lack of any meaningful improvement at that time  -Her brain injury involves corona radiata, midbrain with associated midline shift and right basal ganglia.  These areas correlate with awareness and purposeful movement  Tracheostomy dependent -Currently on a 4.0 cuffless trach with humidified air -Respiratory status in regards to provoked and spontaneous coughing definitely impacted by patient's level of mentation and fatigue -Provoked via deep suction completed while patient fatigued demonstrated no evidence of cough or gag  -The following day while patient more alert provoked cough via deep suction patient demonstrated very weak cough response -6/16 [name] d/w her husband. Plan is to re evaluate efficacy of decannulation in 2 weeks; at best she is weeks to months away from safe decannulation. Her very slow recovery trajectory is concerning for requirement for permanent trach/no decannulation. Of note, she was suctioned per RT with very weak cough noted-no secretions were  detected as well  Colonic Ileus/recurrent constipation/loose stools -KUB: retained stool in rectum.  Given SMOG enema x1 on 6/16 -Continue Miralax prn and fiber supplementation  Hypertensive emergency/Malignant HTN -Continue Norvasc, Lopressor and hydralazine.  Dysphagia -Continue tube feedings, Prosource liquid, free water -Continue supervised pured diet allowing family to administer orals after adequate training  History of urinary retention -Continue Cardura      Other problems: Right upper extremity thrombophlebitis - [name] -> age indeterminate SVT of right cephalic vein  Neck Contracture/torticollis -- Resolved  ESBL positive Enterobacter UTI -Has completed 5 days of meropenem -Contact isolation during hospitalization -continue Oxybutynin suspected bladder spasms  Klebsiella UTI -Resistant to nitrofurantoin and ampicillin -Meropenem transitioned to Rocephin on 5/22 -To minimize risk of recurrent UTI have asked that pure wick only be used at bedtime on a 10 PM to 8 AM schedule  Recurrent diarrhea -Resolved -Likely combination of recent antibiotics as well as scheduled laxative -MiraLAX now as needed  Bacterial Conjunctivitis /periorbital cellulitis -Resolved  Chronic pansinusitis/recurrent fevers 2/2 aspiration PNA;History of Serratia Pneumonia  Hospital Acquired Pneumonia:  -Completed several courses of antibiotic -Follow-up imaging including sinus CT consistent with chronic but stable and resolving sinusitis   Anemia -Stable  HLD -Continue Lipitor  Bilateral knee osteoarthritis/reported ligamentous injury to right knee/right hip pain -Has completed a short course of prednisone, also treated with scheduled Tylenol and NSAIDs. -  History of remote MCL tear. CT right knee on 6/9 with mild tricompartmental osteoarthritis with small joint effusion with findings confirming likely torn MCL -Continue scheduled Ultram noting she appears more comfortable  today -Voltaren gel to knees and hip -As of 6/14 pain appears to be controlled    Data Reviewed: Basic Metabolic Panel: No results for input(s): NA, K, CL, CO2, GLUCOSE, BUN, CREATININE, CALCIUM, MG, PHOS in the last 168 hours.  Liver Function Tests: No results for input(s): AST, ALT, ALKPHOS, BILITOT, PROT, ALBUMIN in the last 168 hours.  CBC: No results for input(s): WBC, NEUTROABS, HGB, HCT, MCV, PLT in the last 168 hours.   CBG: Recent Labs  Lab 01/01/21 1631 01/01/21 1954 01/01/21 2314 01/02/21 0353 01/02/21 0805  GLUCAP 118* 134* 146* 110* 143*      Studies: DG Abd Portable 1V  Result Date: 12/31/2020 CLINICAL DATA:  Possible ileus EXAM: PORTABLE ABDOMEN - 1 VIEW COMPARISON:  12/21/2020 FINDINGS: Gastrostomy tube is present. Bowel gas pattern is unremarkable. Stool is primarily present within the rectum; increased density may reflect suggests prior contrast. IMPRESSION: Normal bowel gas pattern.  Retained stool in the rectum. Electronically Signed   By: Macy Mis M.D.   On: 12/31/2020 11:30     Scheduled Meds:  amLODipine  10 mg Per Tube Daily   atorvastatin  40 mg Per Tube Daily   baclofen  10 mg Per Tube 3 times per day   chlorhexidine gluconate (MEDLINE KIT)  15 mL Mouth Rinse BID   Chlorhexidine Gluconate Cloth  6 each Topical Daily   diclofenac Sodium  4 g Topical QID   doxazosin  2 mg Per Tube Daily   feeding supplement (PROSource TF)  45 mL Per Tube Daily   fiber  1 packet Per Tube BID   free water  200 mL Per Tube Q4H   hydrALAZINE  50 mg Per Tube Q8H   insulin aspart  0-9 Units Subcutaneous Q4H   mouth rinse  15 mL Mouth Rinse 5 X Daily   metoprolol tartrate  100 mg Per Tube BID   modafinil  200 mg Per Tube Daily   tiZANidine  4 mg Per Tube QHS   traMADol  25 mg Per Tube Q6H   Continuous Infusions:  sodium chloride Stopped (10/26/20 1445)   feeding supplement (OSMOLITE 1.2 CAL) 1,000 mL (01/02/21 0422)    Principal Problem:   ICH  (intracerebral hemorrhage) (HCC) Active Problems:   Acute hypoxemic respiratory failure (HCC)   Tracheostomy dependent ([place])   Palliative care by specialist   Muscle hypertonicity   Oropharyngeal dysphagia   Poor prognosis   Aspiration pneumonia of right lower lobe (HCC)   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   Acute cystitis without hematuria   Ileus, unspecified ([place])   Constipation   Consultants: Neurology  Neurosurgery.  Palliative care.   PCCM continues to follow.  No decannulation.   Procedures: Tracheostomy PEG tube EEG Echocardiogram  Antibiotics: Vancomycin 2/12-2/15 Maxipime 2/12-2/15 Ancef 2/19-2/27 Vancomycin 2/20 x 1 dose Maxipime 2/27- 3/5 Vancomycin 3/1-3/3 Ancef 3/6-3/14 Cipro 3/20 4-3/30 Unasyn 3/30-4/4 Rocephin 4/11-4/12 Meropenem 4/12-4/17   Time spent: 25 minutes    Charlynne Cousins ANP  [facility] 7 am - 330 pm/M-F for direct patient care and secure chat Please refer to [place] for contact info 128  Days

## 2021-01-02 NOTE — Plan of Care (Signed)
  Problem: Coping: Goal: Will identify appropriate support needs Outcome: Progressing   Problem: Health Behavior/Discharge Planning: Goal: Ability to manage health-related needs will improve Outcome: Progressing   Problem: Self-Care: Goal: Verbalization of feelings and concerns over difficulty with self-care will improve Outcome: Progressing Goal: Ability to communicate needs accurately will improve Outcome: Progressing   Problem: Nutrition: Goal: Risk of aspiration will decrease Outcome: Progressing   Problem: Intracerebral Hemorrhage Tissue Perfusion: Goal: Complications of Intracerebral Hemorrhage will be minimized Outcome: Progressing   Problem: Intracerebral Hemorrhage Tissue Perfusion: Goal: Complications of Intracerebral Hemorrhage will be minimized Outcome: Progressing   Problem: Ischemic Stroke/TIA Tissue Perfusion: Goal: Complications of ischemic stroke/TIA will be minimized Outcome: Progressing   Problem: Education: Goal: Knowledge of General Education information will improve Description: Including pain rating scale, medication(s)/side effects and non-pharmacologic comfort measures Outcome: Progressing   Problem: Health Behavior/Discharge Planning: Goal: Ability to manage health-related needs will improve Outcome: Progressing   Problem: Clinical Measurements: Goal: Ability to maintain clinical measurements within normal limits will improve Outcome: Progressing Goal: Will remain free from infection Outcome: Progressing Goal: Diagnostic test results will improve Outcome: Progressing Goal: Respiratory complications will improve Outcome: Progressing Goal: Cardiovascular complication will be avoided Outcome: Progressing   Problem: Coping: Goal: Level of anxiety will decrease Outcome: Progressing   Problem: Elimination: Goal: Will not experience complications related to bowel motility Outcome: Progressing Goal: Will not experience complications related  to urinary retention Outcome: Progressing   Problem: Pain Managment: Goal: General experience of comfort will improve Outcome: Progressing   Problem: Skin Integrity: Goal: Risk for impaired skin integrity will decrease Outcome: Progressing   

## 2021-01-03 LAB — GLUCOSE, CAPILLARY
Glucose-Capillary: 104 mg/dL — ABNORMAL HIGH (ref 70–99)
Glucose-Capillary: 123 mg/dL — ABNORMAL HIGH (ref 70–99)
Glucose-Capillary: 123 mg/dL — ABNORMAL HIGH (ref 70–99)
Glucose-Capillary: 135 mg/dL — ABNORMAL HIGH (ref 70–99)
Glucose-Capillary: 148 mg/dL — ABNORMAL HIGH (ref 70–99)
Glucose-Capillary: 149 mg/dL — ABNORMAL HIGH (ref 70–99)
Glucose-Capillary: 153 mg/dL — ABNORMAL HIGH (ref 70–99)

## 2021-01-03 NOTE — Plan of Care (Signed)
  Problem: Nutrition: Goal: Risk of aspiration will decrease Outcome: Progressing   Problem: Intracerebral Hemorrhage Tissue Perfusion: Goal: Complications of Intracerebral Hemorrhage will be minimized Outcome: Progressing   Problem: Clinical Measurements: Goal: Ability to maintain clinical measurements within normal limits will improve Outcome: Progressing Goal: Will remain free from infection Outcome: Progressing Goal: Diagnostic test results will improve Outcome: Progressing Goal: Respiratory complications will improve Outcome: Progressing Goal: Cardiovascular complication will be avoided Outcome: Progressing   Problem: Coping: Goal: Level of anxiety will decrease Outcome: Progressing   Problem: Elimination: Goal: Will not experience complications related to bowel motility Outcome: Progressing Goal: Will not experience complications related to urinary retention Outcome: Progressing   Problem: Pain Managment: Goal: General experience of comfort will improve Outcome: Progressing   Problem: Skin Integrity: Goal: Risk for impaired skin integrity will decrease Outcome: Progressing   Problem: Intracerebral Hemorrhage Tissue Perfusion: Goal: Complications of Intracerebral Hemorrhage will be minimized Outcome: Progressing   Problem: Ischemic Stroke/TIA Tissue Perfusion: Goal: Complications of ischemic stroke/TIA will be minimized Outcome: Progressing   

## 2021-01-03 NOTE — Progress Notes (Signed)
[facility] PROGRESS NOTE  Charlene [name] [id number]:[id number] DOB: 04-18-1966 DOA: 08/26/2020 PCP: Patient, [name] (Inactive)     Status: Remains inpatient appropriate because:Altered mental status and Unsafe d/c plan: Requirement to discharge to trach capable SNF   Dispo: The patient is from: Home              Anticipated d/c is to: SNF trach capable[place]-we have recently discovered that [facility] may have a trach bed available in clinicals have been sent to the facility for review -trach team re eval 6/3 and still rec NOT to decannulate 2/2 and lack of appropriate voluntary and involuntary cough response              Patient currently is not medically stable to d/c.   Difficult to place patient Yes   Level of care: Telemetry Medical  Code Status: Full Family Communication: husband Charlene [name] [phone]) is her POA and legal contact.  Charlene [name] was at bedside on 5/18 and given update.  Charlene [name] at bedside 6/6; mother at bedside multiple times this week with the most recent update on 6/17 DVT prophylaxis: Chronically bedbound.  5/6: We will discontinue prophylactic Lovenox   Vaccination status: Unvaccinated-husband still undecided.  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  Subsequently on 2/10, she developed obtundation and respiratory arrest leading to intubation.  She has remained persistently obtunded, now  has a trach and a PEG.  Hospital course complicated by persistent fever, Serratia pneumonia and now pre-orbital cellulitis  As of 3/25 with adjustments in hypertonicity medications patient began to improve regarding hypertonicity.  Eventually started on provigil with improvements in alertness.  Evaluated by rehab physician who recommended increasing provigil dose further.  Subsequently patient has become more alert.  Most days she is able to track and with encouragement able to follow simple commands especially since partial obstruction glasses placed.  See progress note dictated 4/27 for expanded details.   2/09 Admit for [place] 2/10 EVD placed, later intubated due to obtundation and respiratory arrest 2/11 MRI Brain, hypertonic saline d/c'd, EVD not working.  Husband did not consent to PICC  2/13 EVD removed 2/17 [place] discussion with family, PMT 2/24 Bedside trach and PEG 3/1 Transferred to [facility]  Subjective: Initially was sleeping soundly and did not awaken with my examination.  Later mother came into room and patient awakened.  Objective: Vitals:   01/03/21 0328 01/03/21 0758  BP: 134/79 (!) 147/90  Pulse: 85 100  Resp: 20 20  Temp: 98.5 F (36.9 C) 98.6 F (37 C)  SpO2: 97% 99%    Intake/Output Summary (Last 24 hours) at 01/03/2021 0930 Last data filed at 01/03/2021 0823 Gross per 24 hour  Intake 10343 ml  Output --  Net 10343 ml     Filed Weights   12/29/20 0500 01/02/21 2228 01/03/21 0500  Weight: 53.6 kg 54.8 kg 54.3 kg    Exam: Gen: Awakened from sleep.  Initially  with frown on her face, facial expression returned back to her normal baseline.  She is in no acute distress Respiratory: #4.0 cuffless trach, trach collar with FiO2 21% at 5L, lungs remain clear, no increased work of breathing.  No spontaneous cough.  Note patient has awakened with mouthful of saliva (I have noticed on the past 3 days upon evaluating her after just awakening her) Cardiovascular: Normal heart  sounds, normotensive, no peripheral edema Abdomen:  PEG for tube feeding-LBM 6/12.  Soft and nondistended.  No documentation as to whether patient had a bowel movement after smog enema on 6/16 Musculoskeletal: Bilateral atrophy of thigh and calf muscles-continues to have some right knee pain that is worse with extension Neurologic: Tracking with eyes.  Just awakened so very slow to respond with purposeful movement of right upper extremity.  Limited to no movement of left upper extremity.  Documented extremely poor cough reflex with suctioning on 6/16 Psychiatric: Awake and nonverbal so unable to assess orientation effectively   Assessment/Plan: Acute problems: Multiple acute strokes  Right Thalamus ICH with IVH  Obstructive Hydrocephalus/Acute on persistent metabolic Encephalopathy/Non TBI related hypertonicity -Continue baclofen and tizanidine for spasticity. Continue Provigil and therapies -Reevaluated by CIR on 5/2 with SNF recommended based on lack of any meaningful improvement at that time  -Her brain injury involves corona radiata, midbrain with associated midline shift and right basal ganglia.  These areas correlate with awareness and purposeful movement  Tracheostomy dependent -Currently on a 4.0 cuffless trach with humidified air -Respiratory status in regards to provoked and spontaneous coughing definitely impacted by patient's level of mentation and fatigue -Provoked via deep suction completed while patient fatigued demonstrated no evidence of cough or gag  -The following day while patient more alert provoked cough via deep suction patient demonstrated very weak cough response -6/16 Dr Charlene [name] d/w her husband. Plan is to re evaluate efficacy of decannulation in 2 weeks; at best she is weeks to months away from safe decannulation. Her very slow recovery trajectory is concerning for requirement for permanent trach/no decannulation. Of note, she was suctioned per RT with very weak cough  noted-no secretions were detected as well  Colonic Ileus/recurrent constipation/loose stools -KUB: retained stool in rectum.  Given SMOG enema x1 on 6/16 -Continue Miralax prn and fiber supplementation  Hypertensive emergency/Malignant HTN -Continue Norvasc, Lopressor and hydralazine.  Dysphagia -Continue tube feedings, Prosource liquid, free water -Continue supervised pured diet allowing family to administer orals after adequate training  History of urinary retention -Continue Cardura      Other problems: Right upper extremity thrombophlebitis - [name] -> age indeterminate SVT of right cephalic vein  Neck Contracture/torticollis -- Resolved  ESBL positive Enterobacter UTI -Has completed 5 days of meropenem -Contact isolation during hospitalization -continue Oxybutynin suspected bladder spasms  Klebsiella UTI -Resistant to nitrofurantoin and ampicillin -Meropenem transitioned to Rocephin on 5/22 -To minimize risk of recurrent UTI have asked that pure wick only be used at bedtime on a 10 PM to 8 AM schedule  Recurrent diarrhea -Resolved -Likely combination of recent antibiotics as well as scheduled laxative -MiraLAX now as needed  Bacterial Conjunctivitis /periorbital cellulitis -Resolved  Chronic pansinusitis/recurrent fevers 2/2 aspiration PNA;History of Serratia Pneumonia  Hospital Acquired Pneumonia:  -Completed several courses of antibiotic -Follow-up imaging including sinus CT consistent with chronic but stable and resolving sinusitis   Anemia -Stable  HLD -Continue Lipitor  Bilateral knee osteoarthritis/reported ligamentous injury to right knee/right hip pain -Has completed a short course of prednisone, also treated with  scheduled Tylenol and NSAIDs. -History of remote MCL tear. CT right knee on 6/9 with mild tricompartmental osteoarthritis with small joint effusion with findings confirming likely torn MCL -Continue scheduled Ultram noting she appears  more comfortable today -Voltaren gel to knees and hip -As of 6/14 pain appears to be controlled    Data Reviewed: Basic Metabolic Panel: No results for input(s): NA, K, CL, CO2, GLUCOSE, BUN, CREATININE, CALCIUM, MG, PHOS in the last 168 hours.  Liver Function Tests: No results for input(s): AST, ALT, ALKPHOS, BILITOT, PROT, ALBUMIN in the last 168 hours.  CBC: No results for input(s): WBC, NEUTROABS, HGB, HCT, MCV, PLT in the last 168 hours.   CBG: Recent Labs  Lab 01/02/21 1719 01/02/21 1927 01/03/21 0006 01/03/21 0342 01/03/21 0758  GLUCAP 86 116* 123* 148* 153*      Studies: No results found.   Scheduled Meds:  amLODipine  10 mg Per Tube Daily   atorvastatin  40 mg Per Tube Daily   baclofen  10 mg Per Tube 3 times per day   chlorhexidine gluconate (MEDLINE KIT)  15 mL Mouth Rinse BID   Chlorhexidine Gluconate Cloth  6 each Topical Daily   diclofenac Sodium  4 g Topical QID   doxazosin  2 mg Per Tube Daily   feeding supplement (PROSource TF)  45 mL Per Tube Daily   fiber  1 packet Per Tube BID   free water  200 mL Per Tube Q4H   hydrALAZINE  50 mg Per Tube Q8H   insulin aspart  0-9 Units Subcutaneous Q4H   mouth rinse  15 mL Mouth Rinse 5 X Daily   metoprolol tartrate  100 mg Per Tube BID   modafinil  200 mg Per Tube Daily   tiZANidine  4 mg Per Tube QHS   traMADol  25 mg Per Tube Q6H   Continuous Infusions:  sodium chloride Stopped (10/26/20 1445)   feeding supplement (OSMOLITE 1.2 CAL) 60 mL/hr at 01/03/21 1165    Principal Problem:   ICH (intracerebral hemorrhage) ([place]) Active Problems:   Acute hypoxemic respiratory failure (HCC)   Tracheostomy dependent (HCC)   Palliative care by specialist   Muscle hypertonicity   Oropharyngeal dysphagia   Poor prognosis   Aspiration pneumonia of right lower lobe (HCC)   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   Acute cystitis without hematuria    Ileus, unspecified ([place])   Constipation   Consultants: Neurology  Neurosurgery.  Palliative care.   PCCM continues to follow.  No decannulation.   Procedures: Tracheostomy PEG tube EEG Echocardiogram  Antibiotics: Vancomycin 2/12-2/15 Maxipime 2/12-2/15 Ancef 2/19-2/27 Vancomycin 2/20 x 1 dose Maxipime 2/27- 3/5 Vancomycin 3/1-3/3 Ancef 3/6-3/14 Cipro 3/20 4-3/30 Unasyn 3/30-4/4 Rocephin 4/11-4/12 Meropenem 4/12-4/17   Time spent: 25 minutes    Charlynne Cousins ANP  [facility] 7 am - 330 pm/M-F for direct patient care and secure chat Please refer to [place] for contact info 129  Days

## 2021-01-04 LAB — GLUCOSE, CAPILLARY
Glucose-Capillary: 119 mg/dL — ABNORMAL HIGH (ref 70–99)
Glucose-Capillary: 121 mg/dL — ABNORMAL HIGH (ref 70–99)
Glucose-Capillary: 125 mg/dL — ABNORMAL HIGH (ref 70–99)
Glucose-Capillary: 132 mg/dL — ABNORMAL HIGH (ref 70–99)
Glucose-Capillary: 134 mg/dL — ABNORMAL HIGH (ref 70–99)
Glucose-Capillary: 154 mg/dL — ABNORMAL HIGH (ref 70–99)

## 2021-01-04 NOTE — Plan of Care (Signed)
  Problem: Coping: Goal: Will identify appropriate support needs Outcome: Progressing   Problem: Intracerebral Hemorrhage Tissue Perfusion: Goal: Complications of Intracerebral Hemorrhage will be minimized Outcome: Progressing

## 2021-01-04 NOTE — Progress Notes (Addendum)
[facility] PROGRESS NOTE  Charlene [name] BDZ:[id number] DOB: [birth year] DOA: 08/26/2020 PCP: Patient, [name] (Inactive)    October 08, 2020     10/16/2020                        10/16/2020     10/18/2020                        10/20/2020      12/30/2020                   12/30/2020   01/04/2021   Status: Remains inpatient appropriate because:Altered mental status and Unsafe d/c plan: Requirement to discharge to trach capable SNF   Dispo: The patient is from: Home              Anticipated d/c is to: SNF trach capable-[place] available-we have recently discovered that [facility] may have a trach bed available in clinicals have been sent to the facility for review -trach team re eval 6/3 and still rec NOT to decannulate 2/2 and lack of appropriate voluntary and involuntary cough response              Patient currently is not medically stable to d/c.   Difficult to place patient Yes   Level of care: Telemetry Medical  Code Status: Full Family Communication: [name] [phone]) is her POA and legal contact.  [name] was at bedside on 5/18 and given update.  [name] at bedside 6/6; mother at bedside multiple times this week with the most recent update on 6/17 DVT prophylaxis: Chronically bedbound.  5/6: We will discontinue prophylactic Lovenox   Vaccination status: Unvaccinated-husband still undecided.  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  Subsequently on 2/10, she developed obtundation and respiratory arrest leading to intubation.  She has remained persistently obtunded, now has a trach and a PEG.  Hospital course complicated by persistent fever, Serratia pneumonia and now pre-orbital cellulitis  As of 3/25 with adjustments in hypertonicity medications patient began to improve regarding hypertonicity.  Eventually started on provigil with improvements in alertness.  Evaluated by rehab physician who recommended increasing provigil dose further.  Subsequently patient has become more alert.  Most days she is able to track and with encouragement able to follow simple commands especially since partial obstruction glasses placed.  See progress note dictated 4/27 for expanded details.   2/09 Admit for New  2/10 EVD placed, later intubated due to obtundation and respiratory arrest 2/11 MRI Brain, hypertonic saline d/c'd, EVD not working.  Husband did not consent to PICC  2/13 EVD removed 2/17 [place] discussion with family, PMT 2/24 Bedside trach and PEG 3/1 Transferred to [facility]  Subjective: Patient very alert and attempting to engage in conversation.  Tries to phonate but has been unsuccessful.  Mother was working on patient's hair when she looked over and noticed  the patient had a comb in her hand.  Regular the patient began trying to comb her own hair.  See picture above.  Is been on phone and also reiterates that yesterday while he was using an electronic massage device on her knee that he would pull it away and she would grab it and replace it.  This activity occurred multiple times.  Objective: Vitals:   01/04/21 0315 01/04/21 0432  BP: 129/80   Pulse: 79 70  Resp: 15   Temp: 97.6 F (36.4 C)   SpO2: 100%     Intake/Output Summary (Last 24 hours) at 01/04/2021 0757 Last data filed at 01/04/2021 0054 Gross per 24 hour  Intake 1628 ml  Output 320 ml  Net 1308 ml    Filed Weights   12/29/20  0500 01/02/21 2228 01/03/21 0500  Weight: 53.6 kg 54.8 kg 54.3 kg    Exam:       Gen: Alert, very interactive and engaging today.  Did smile at her mother earlier. Respiratory: #4.0 cuffless trach, trach collar with FiO2 21% at 5L, lungs remain clear, no cough today.  Work of breathing Cardiovascular: Normotensive with normal heart sounds, no peripheral edema, regular pulse Abdomen:  PEG for tube feeding-LBM 6/17.  Nondistended and nontender. Musculoskeletal: Bilateral atrophy of thigh and calf muscles-continues to have some right knee pain that is worse with extension and upon awakening Neurologic: Alert, definite purposeful movement of right upper extremity noting she is attempting to comb her hair.  No spasticity noted.  Preference is to keep legs flexed at hip and knee noting in the a.m. she has significant stiffness and discomfort with extension.  Continues to track all speakers and activity in the room Psychiatric: Alert.  Unable to accurately assess orientation.  Patient unable to follow direct commands but interestingly I asked her to stick out her tongue she was unable to do that but grabbed to come to apparently [place] me she was able to comb her hair.  Attempted to phonate but was unsuccessful.   Assessment/Plan: Acute problems: Multiple acute strokes  Right Thalamus ICH with IVH  Obstructive Hydrocephalus/Acute on persistent metabolic Encephalopathy/Non TBI related hypertonicity -Continue baclofen and tizanidine for spasticity. Continue Provigil and therapies -Reevaluated by CIR on 5/2 with SNF recommended based on lack of any meaningful improvement at that time  -Her brain injury involves corona radiata, midbrain with associated midline shift and right basal ganglia.  These areas correlate with awareness and purposeful movement  Tracheostomy dependent -Provoked cough via deep suction completed while patient fatigued demonstrated no evidence of cough  -When patient more alert did  have a weak provoke cough with deep suctioning -6/16 [name] d/w her husband. Plan is to re evaluate efficacy of decannulation in 2 weeks; at best she is weeks to months away from safe decannulation. Her very slow recovery trajectory is concerning for requirement for permanent trach/no decannulation. Reevaluated today by trach team.  Per their documentation continue PMV noting she is not yet a candidate for capping  Colonic Ileus/recurrent constipation/loose stools -Continue Miralax prn and fiber supplementation  Hypertensive emergency/Malignant HTN -Continue Norvasc, Lopressor and hydralazine.  Dysphagia -Continue tube feedings, Prosource liquid, free water -Continue supervised pured diet allowing family to administer orals after adequate training  History of urinary retention -Continue Cardura      Other problems: Right upper extremity thrombophlebitis - [name] -> age indeterminate SVT of right cephalic vein  Neck Contracture/torticollis -- Resolved  ESBL positive Enterobacter UTI -Has completed 5  days of meropenem -Contact isolation during hospitalization -continue Oxybutynin suspected bladder spasms  Klebsiella UTI -Resistant to nitrofurantoin and ampicillin -Meropenem transitioned to Rocephin on 5/22 -To minimize risk of recurrent UTI have asked that pure wick only be used at bedtime on a 10 PM to 8 AM schedule  Recurrent diarrhea -Resolved -Likely combination of recent antibiotics as well as scheduled laxative -MiraLAX now as needed  Bacterial Conjunctivitis /periorbital cellulitis -Resolved  Chronic pansinusitis/recurrent fevers 2/2 aspiration PNA;History of Serratia Pneumonia  Hospital Acquired Pneumonia:  -Completed several courses of antibiotic -Follow-up imaging including sinus CT consistent with chronic but stable and resolving sinusitis   Anemia -Stable  HLD -Continue Lipitor  Bilateral knee osteoarthritis/reported ligamentous injury to right  knee/right hip pain -Has completed a short course of prednisone, also treated with scheduled Tylenol and NSAIDs. -History of remote MCL tear. CT right knee on 6/9 with mild tricompartmental osteoarthritis with small joint effusion with findings confirming likely torn MCL -Continue scheduled Ultram noting she appears more comfortable today -Voltaren gel to knees and hip -As of 6/14 pain appears to be controlled    Data Reviewed: Basic Metabolic Panel: No results for input(s): NA, K, CL, CO2, GLUCOSE, BUN, CREATININE, CALCIUM, MG, PHOS in the last 168 hours.  Liver Function Tests: No results for input(s): AST, ALT, ALKPHOS, BILITOT, PROT, ALBUMIN in the last 168 hours.  CBC: No results for input(s): WBC, NEUTROABS, HGB, HCT, MCV, PLT in the last 168 hours.   CBG: Recent Labs  Lab 01/03/21 1541 01/03/21 1937 01/03/21 2343 01/04/21 0315 01/04/21 0747  GLUCAP 104* 123* 135* 134* 132*     Studies: No results found.   Scheduled Meds:  amLODipine  10 mg Per Tube Daily   atorvastatin  40 mg Per Tube Daily   baclofen  10 mg Per Tube 3 times per day   chlorhexidine gluconate (MEDLINE KIT)  15 mL Mouth Rinse BID   Chlorhexidine Gluconate Cloth  6 each Topical Daily   diclofenac Sodium  4 g Topical QID   doxazosin  2 mg Per Tube Daily   feeding supplement (PROSource TF)  45 mL Per Tube Daily   fiber  1 packet Per Tube BID   free water  200 mL Per Tube Q4H   hydrALAZINE  50 mg Per Tube Q8H   insulin aspart  0-9 Units Subcutaneous Q4H   mouth rinse  15 mL Mouth Rinse 5 X Daily   metoprolol tartrate  100 mg Per Tube BID   modafinil  200 mg Per Tube Daily   tiZANidine  4 mg Per Tube QHS   traMADol  25 mg Per Tube Q6H   Continuous Infusions:  sodium chloride Stopped (10/26/20 1445)   feeding supplement (OSMOLITE 1.2 CAL) 60 mL/hr at 01/03/21 1608    Principal Problem:   ICH (intracerebral hemorrhage) ([place]) Active Problems:   Acute hypoxemic respiratory failure (HCC)    Tracheostomy dependent ([place])   Palliative care by specialist   Muscle hypertonicity   Oropharyngeal dysphagia   Poor prognosis   Aspiration pneumonia of right lower lobe (HCC)   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   Acute cystitis without hematuria   Ileus, unspecified ([place])   Constipation   Consultants: Neurology  Neurosurgery.  Palliative care.   PCCM continues to follow.  No decannulation.   Procedures: Tracheostomy PEG tube EEG Echocardiogram  Antibiotics: Vancomycin 2/12-2/15 Maxipime 2/12-2/15 Ancef 2/19-2/27 Vancomycin 2/20 x 1 dose Maxipime 2/27- 3/5 Vancomycin 3/1-3/3  Ancef 3/6-3/14 Cipro 3/20 4-3/30 Unasyn 3/30-4/4 Rocephin 4/11-4/12 Meropenem 4/12-4/17   Time spent: 25 minutes    Erin Hearing ANP  [facility] 7 am - 330 pm/M-F for direct patient care and secure chat Please refer to [place] for contact info 130  Days

## 2021-01-04 NOTE — Progress Notes (Signed)
NAME:  Charlene [name], MRN:  [id number], DOB:  [birth year], LOS: 130 ADMISSION DATE:  08/26/2020, CONSULTATION DATE:  2/10 REFERRING MD:  Roda Shutters, CHIEF COMPLAINT:  headache   History of Present Illness:  55 year old female admitted with hypertensive R thalamic/basal ganglia ICH. Resultant obstructive hydrocephalus prompting EVD placement 2/10. Subsequently, 2/10 she had acute mental status change to obtundation and respiratory arrest resulting in intubation. Trach and Peg 2/24. Trach change to #6 cuffless on 3/11.   Pertinent  Medical History  Hypertension Medication non-compliance  Significant Hospital Events: Including procedures, antibiotic start and stop dates in addition to other pertinent events   2/09 Admit for ICH 2/10 EVD placed, later intubated due to obtundation and respiratory arrest 2/11 MRI Brain, hypertonic saline d/c'd, EVD not working.  Husband did not consent to PICC  2/13 EVD removed 2/17 GOC discussion with family, PMT 3/11 trach changed to Shiley 6 uncuffed 4/18 28% ATC / 5L  5/8 downsized trach to 4 5/16 tolerating z 4 well. No real functional improvements since 5/8 5/ 23 no improvement in mental status 5/20 no improvement of mental status   ETT 2/10 >> 09/10/2020 EVD 2/10 > non functioning 2/11 at 1500; clamped 2/12 am > 2/13 Trach (Dr. Denese Killings) 2/24 >>  PEG placement (Dr. Sheliah Hatch) 2/24 >>  CT Head 2/9 >> Acute hemorrhage with the epicenter in the right thalamus. Measurement is approximately 3.2 x 2.1 x 2.5 cm (volume 8.8 cm^3). Intraventricular penetration with blood filling the third and fourth ventricles and nearly filling the right lateral ventricle. No hydrocephalus. CTA Head/Neck 2/10 >> Unchanged size of intraparenchymal hemorrhage centered in the right thalamus and basal ganglia with extension into the right lateral ventricle. 2. No intracranial arterial occlusion or high-grade stenosis. No dissection, aneurysm or hemodynamically significant stenosis of  the carotid or vertebral arteries. CT Head 2/10 >> Stable parenchymal hemorrhage in the right thalamus and basal ganglia with intraventricular extension as described. No new focal abnormality is noted. CT Head 2/10 (post intubation) >> redemonstrated intraparenchymal hemorrhage centered within the R thalamus/basal ganglia extending into the cerebral peduncle/midbrain with associated intraventricular extension and hemorrhage in the R lateral ventricle and fourth ventricle, overall not significantly changed from prior; stable mass effect, 7mm of R to L midline shift, postsurgical changes of ventriculostomy TTE 2/10 >> LVEF 60 to 65%, LV has normal function, no regional wall motion abnormalities, mild concentric LVH.  RV systolic function is normal, normal pulmonary artery systolic pressure.  MRA head 2/11 >>Negative intracranial MR angiography. No evidence of aneurysm or high flow vascular malformation to explain the intraparenchymal hemorrhage  MRI brain 2/11 >> Similar intraparenchymal hemorrhage centered within the right thalamus and basal ganglia extending into the right midbrain with intraventricular extension, effacement of the prepontine cistern and similar 4 mm of leftward midline shift; separate areas of restricted diffusion and edema involving the R > L mesial temporal lobes/parahippocampal cortex, bilateral basal ganglia, and possibly the upper pons that are c/f acute infarcts, most likely secondary to mass effect from hemorrhage. MRI brain 2/19 >> intraventricular clot has decreased in size but mid-brain parenchymal clot has not and there are more ischemic infarcts in the surrounding brain.  CT Head WO Contrast 09/07/20>> Expected evolution of intracranial hemorrhage since 08/30/2020 head CT. Multiple areas of low attenuation corresponding to some of the infarcts seen on prior MRI. Ventricle caliber similar to prior MRI  Interim History / Subjective:  Seen this morning with mother at bedside.  Wearing PMV. Vital signs stable.  Not responsive.  Mother says she has been eating pudding and talking.   Objective   Blood pressure 129/80, pulse 77, temperature 97.6 F (36.4 C), temperature source Oral, resp. rate 16, height 5\' 5"  (1.651 m), weight 54.3 kg, last menstrual period 03/03/2014, SpO2 100 %, unknown if currently breastfeeding.    FiO2 (%):  [21 %] 21 %   Intake/Output Summary (Last 24 hours) at 01/04/2021 1141 Last data filed at 01/04/2021 0054 Gross per 24 hour  Intake 465 ml  Output 320 ml  Net 145 ml   Filed Weights   12/29/20 0500 01/02/21 2228 01/03/21 0500  Weight: 53.6 kg 54.8 kg 54.3 kg    Examination:  General: awake, chronically ill appearing woman with trach HEENT: [place]/AT, eyes anicteric Neck: trach in place, no bleeding or drainage Cards: S1S2, RRR Resp: non labored Abd: soft, ND Ext: no significant edema, muscle wasting. Neuro: awake, tracks, does not follow commands   Labs/imaging that I havepersonally reviewed  (right click and "Reselect all SmartList Selections" daily)  Resp culture 12/17/20: K. Pneumoniae 6/3 Swallow study: mild posterior tracheal aspiration with thin liquids with delayed cough reflex. Mild difficulty with oral control, piecemeal bolusing of liquids into pharynx. Pharyngeal swallow consistently triggered at pyriforms. Consistent eiglottic closure over larynx when swallow triggered. Excellent pharyngeal squeeze. Occasional trace penetrations of liquids over the arytenoids with hovering over inferior vocal folds.   No updated labs for review.   Resolved Hospital Problem list     Assessment & Plan:  Tracheostomy dependence post CVA Severe deconditioning Muscle wasting Poor cough mechanics persist  Plan -Continue routine trach care, no plans for decannulation currently. -She is tolerating PMV as needed.  Continue this. -I really worry for her ongoing respiratory status if she were to be decannulated.   - will re-evaluate today  need for trach next week. I suspect unless she has made dramatic improvements in cough mechanics, working with PT, it will need to stay.  Pulmonary and Critical Care Medicine [facility]

## 2021-01-05 LAB — GLUCOSE, CAPILLARY
Glucose-Capillary: 152 mg/dL — ABNORMAL HIGH (ref 70–99)
Glucose-Capillary: 124 mg/dL — ABNORMAL HIGH (ref 70–99)
Glucose-Capillary: 133 mg/dL — ABNORMAL HIGH (ref 70–99)
Glucose-Capillary: 125 mg/dL — ABNORMAL HIGH (ref 70–99)
Glucose-Capillary: 100 mg/dL — ABNORMAL HIGH (ref 70–99)

## 2021-01-05 NOTE — TOC Progression Note (Addendum)
Transition of Care [facility]) - Progression Note    Patient Details  Name: Charlene [name] MRN: [id number] Date of Birth: October 17, 1965  Transition of Care [facility]) CM/SW Contact  Janae Bridgeman, RN Phone Number: 01/05/2021, 1:07 PM  Clinical Narrative:    Case management spoke with the patient's husband, [name], on the phone and he states that he spoke with Lafonda Mosses at disability office and they are needing additional information at this time and that they would send an email to financial counseling regarding this matter.  CM sent a secure email to Christia Reading, financial counseling supervisor, to let me know how [facility] Team could assist with this matter.    CM and MSW will continue to follow for transitions of care needs.  I also left a message with Selena Batten, CM at [employer] and asked if either of her facilities including [facility], [facility] and/or [facility] would consider the patient for admission.   Expected Discharge Plan: Skilled Nursing Facility Barriers to Discharge: Continued Medical Work up  Expected Discharge Plan and Services Expected Discharge Plan: Skilled Nursing Facility In-house Referral: Clinical Social Work Discharge Planning Services: CM Consult Post Acute Care Choice: Skilled Nursing Facility Living arrangements for the past 2 months: Single Family Home                                       Social Determinants of Health (SDOH) Interventions    Readmission Risk Interventions Readmission Risk Prevention Plan 09/29/2020  Transportation Screening Complete  PCP or Specialist Appt within 3-5 Days Complete  HRI or Home Care Consult Complete  Social Work Consult for Recovery Care Planning/Counseling Complete  Palliative Care Screening Complete  Medication Review Oceanographer) Complete  Some recent data might be hidden

## 2021-01-05 NOTE — Plan of Care (Signed)
  Problem: Coping: Goal: Will identify appropriate support needs Outcome: Progressing   Problem: Ischemic Stroke/TIA Tissue Perfusion: Goal: Complications of ischemic stroke/TIA will be minimized Outcome: Progressing   Problem: Intracerebral Hemorrhage Tissue Perfusion: Goal: Complications of Intracerebral Hemorrhage will be minimized Outcome: Progressing

## 2021-01-05 NOTE — Progress Notes (Signed)
Physical Therapy Treatment Patient Details Name: Charlene [name] MRN: [id number] DOB: 1965-09-08 Today's Date: 01/05/2021    History of Present Illness Pt is 55 y/o female presents to [facility] on 08/26/20 with L sided weakness and headaches. 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 s/p Right frontal ventricular catheter placement on 2/10, stopped functioning on 2/11.  MRI 2/11 shows right greater than left mesial temporal lobes / parahippocampal cortex, bilateral basal ganglia, and possibly the upper pons that are concerning for acute infarcts, most likely secondary to mass effect from the hemorrhage. ETT 2/10-2/24.  Pt with trach and PEG on 09/10/20. Hospital course complicated by persistent fever, Serratia pneumonia and now pre-orbital cellulitis.   PMH includes anxiety, HTN.    PT Comments    Charlene [name] tolerated tilt bed today at 67 degrees (nearly fully standing) for >15 mins.  She was able to follow more basic commands than I have seen her do in previous sessions and even waved and gave thumb's up today.  She identified herself with a nod when asked "is this you" with her picture board, and whispered "yeah" twice when I asked her if it hurt when I was stretching her (her facial grimace would indicate so).  Her mother was present and very hands on in her care throughout our session including assisting in her stretches, positioning, use of the bed, assisting in bed mobility, and line management.  She remains appropriate for post acute rehab making slow, but noticeable gains. Plan to sit EOB next session to work on trunk control, sitting balance and trunk rotation (very stiff in her head/neck/trunk/pelvis).   Follow Up Recommendations  SNF     Equipment Recommendations  Wheelchair (measurements PT);Wheelchair cushion (measurements PT);Hospital bed;Other (comment)  (hoyer lift 18x18 tilt in space WC)    Recommendations for Other Services       Precautions / Restrictions Precautions Precautions: Fall Precaution Comments: peg and trach Required Braces or Orthoses: Other Brace Other Brace: RLE soft knee brace and LUE soft elbow brace (wearing schedule in RN orders); bil PRAFOs    Mobility  Bed Mobility Overal bed mobility: Needs Assistance             General bed mobility comments: Two person total assist to scoot to [facility] Angle: 67 degrees Total Minutes in Angle: 15 minutes Patient Response: Cooperative  Transfers                    Ambulation/Gait                 Stairs             Wheelchair Mobility    Modified Rankin (Stroke Patients Only) Modified Rankin (Stroke Patients Only) Pre-Morbid Rankin Score: No symptoms Modified Rankin: Severe disability     Balance                                            Cognition Arousal/Alertness: Awake/alert Behavior During Therapy: Flat affect Overall Cognitive Status: Impaired/Different from baseline Area of Impairment: Attention;Following commands;Problem solving                   Current Attention Level: Sustained Memory: Decreased short-term memory Following Commands: Follows one step commands inconsistently;Follows one step  commands with increased time Safety/Judgement: Decreased awareness of deficits Awareness: Intellectual Problem Solving: Slow processing;Decreased initiation;Requires verbal cues;Requires tactile cues (multimodal cues, visual) General Comments: Pt alert, mumbling throughout, following some basic commands ~25% of time when given time and visual/verbal cues (wave, Queen's wave, thumb's up, take this object, give this object back, "is this you?" when looking at her poster with pictures and nodding yes (never no) when I got to her picture.      Exercises General Exercises - Upper Extremity Shoulder Flexion:  PROM;5 reps;Seated;Left Elbow Extension: PROM;5 reps;Seated;Left Other Exercises Other Exercises: PROM to cervical spine (extension (flat no pillow) R/L rotation and R/L side bend. Other Exercises: PROM to bil knee extension    General Comments General comments (skin integrity, edema, etc.): Pt left in chair mode with R knee extension brace, L elbow extension brace and bil PRAFOs.      Pertinent Vitals/Pain Pain Assessment: Faces Faces Pain Scale: Hurts even more Pain Location: with ROM To head, bil legs and L arm Pain Descriptors / Indicators: Grimacing;Guarding Pain Intervention(s): Limited activity within patient's tolerance;Repositioned;Monitored during session    Home Living                      Prior Function            PT Goals (current goals can now be found in the care plan section) Progress towards PT goals: Progressing toward goals    Frequency    Min 2X/week      PT Plan Current plan remains appropriate    Co-evaluation              AM-PAC PT "6 Clicks" Mobility   Outcome Measure  Help needed turning from your back to your side while in a flat bed without using bedrails?: Total Help needed moving from lying on your back to sitting on the side of a flat bed without using bedrails?: Total Help needed moving to and from a bed to a chair (including a wheelchair)?: Total Help needed standing up from a chair using your arms (e.g., wheelchair or bedside chair)?: Total Help needed to walk in hospital room?: Total Help needed climbing 3-5 steps with a railing? : Total 6 Click Score: 6    End of Session   Activity Tolerance: Patient tolerated treatment well Patient left: in bed;with call bell/phone within reach;with family/visitor present   PT Visit Diagnosis: Hemiplegia and hemiparesis;Other symptoms and signs involving the nervous system (R29.898);Other abnormalities of gait and mobility (R26.89) Hemiplegia - Right/Left: Right (impairments  bil) Hemiplegia - dominant/non-dominant: Dominant Hemiplegia - caused by: Cerebral infarction;Nontraumatic intracerebral hemorrhage;Other Nontraumatic intracranial hemorrhage     Time: 1127-1235 PT Time Calculation (min) (ACUTE ONLY): 68 min  Charges:  $Therapeutic Exercise: 8-22 mins $Therapeutic Activity: 53-67 mins                    Corinna Capra, PT, DPT  Acute Rehabilitation Ortho Tech Supervisor [phone] pager [phone]) [phone] office

## 2021-01-05 NOTE — Plan of Care (Signed)
  Problem: Coping: Goal: Will identify appropriate support needs Outcome: Progressing   Problem: Health Behavior/Discharge Planning: Goal: Ability to manage health-related needs will improve Outcome: Progressing   Problem: Self-Care: Goal: Verbalization of feelings and concerns over difficulty with self-care will improve Outcome: Progressing Goal: Ability to communicate needs accurately will improve Outcome: Progressing   Problem: Nutrition: Goal: Risk of aspiration will decrease Outcome: Progressing   Problem: Intracerebral Hemorrhage Tissue Perfusion: Goal: Complications of Intracerebral Hemorrhage will be minimized Outcome: Progressing   Problem: Intracerebral Hemorrhage Tissue Perfusion: Goal: Complications of Intracerebral Hemorrhage will be minimized Outcome: Progressing   Problem: Ischemic Stroke/TIA Tissue Perfusion: Goal: Complications of ischemic stroke/TIA will be minimized Outcome: Progressing   Problem: Education: Goal: Knowledge of General Education information will improve Description: Including pain rating scale, medication(s)/side effects and non-pharmacologic comfort measures Outcome: Progressing   Problem: Health Behavior/Discharge Planning: Goal: Ability to manage health-related needs will improve Outcome: Progressing   Problem: Clinical Measurements: Goal: Ability to maintain clinical measurements within normal limits will improve Outcome: Progressing Goal: Will remain free from infection Outcome: Progressing Goal: Diagnostic test results will improve Outcome: Progressing Goal: Respiratory complications will improve Outcome: Progressing Goal: Cardiovascular complication will be avoided Outcome: Progressing   Problem: Coping: Goal: Level of anxiety will decrease Outcome: Progressing   Problem: Elimination: Goal: Will not experience complications related to bowel motility Outcome: Progressing Goal: Will not experience complications related  to urinary retention Outcome: Progressing   Problem: Pain Managment: Goal: General experience of comfort will improve Outcome: Progressing   Problem: Skin Integrity: Goal: Risk for impaired skin integrity will decrease Outcome: Progressing   

## 2021-01-05 NOTE — Progress Notes (Signed)
  Speech Language Pathology Treatment: Dysphagia;Cognitive-Linquistic  Patient Details Name: Charlene [name] MRN: [id number] DOB: [birth year] Today's Date: 01/05/2021 Time: 6440-3474 SLP Time Calculation (min) (ACUTE ONLY): 25 min  Assessment / Plan / Recommendation Clinical Impression  Excellent session with Fusako today! Her sister, Arline Asp, was present.  PMV placed and pt repositioned for PO intake. She self fed honey-thick liquid from edge of cup with no assistance to hold cup or bring to lips.  Left sided spillage persists, but use of straw helped to minimize spillage.  There were no s/s of aspiration. When clinician said, "this is apple juice," Amylia very clearly repeated "apple juice" with low volume, but intelligibly.   Attempted alphabet board to assess letter recognition and identification in isolation.  Difficult to ascertain accuracy; however, when shown greeting cards in room, pt demonstrated oral reading of single words on mother's day card.  Spontaneity of verbalizations has improved; volume generally is quite low, but pt shows improved initiation and stimulability for functional speech.  Recommend ongoing f/u to pursue assessment of reading and word/picture discrimination; SLP to begin trials of advanced POs at bedside.    HPI HPI: Pt is 55 y/o female [facility] on 2/9 with L sided weakness and headaches. PMH includes anxiety, HTN. 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 S/p Right frontal ventricular catheter placement on 2/10, stopped functioning on 2/11.  MRI 2/11 shows right greater than left mesial temporal lobes / parahippocampal cortex, bilateral basal ganglia, and possibly the upper pons that are concerning for acute infarcts, most likely secondary to mass effect from the hemorrhage. ETT 2/10; trach/PEG 09/10/20.  Trach changed to #6  cuffless 3/11; #4 cuffless 5/4. Hospital course complicated by persistent fever,  Serratia pneumonia and pre-orbital cellulitis.      SLP Plan  Continue with current plan of care       Recommendations         Patient may use Passy-Muir Speech Valve: During all waking hours (remove during sleep)         Oral Care Recommendations: Oral care QID Follow up Recommendations: Skilled Nursing facility SLP Visit Diagnosis: Dysphagia, oropharyngeal phase (R13.12) Plan: Continue with current plan of care       GO              Charlene [name] L. Samson [name], MA CCC/SLP Acute Rehabilitation Services Office number [phone] Pager [phone]   [name] 01/05/2021, 3:31 PM

## 2021-01-05 NOTE — Progress Notes (Signed)
[facility] PROGRESS NOTE  Charlene [name] NFA:[id number] DOB: [birth year] DOA: 08/26/2020 PCP: Patient, [name] (Inactive)    October 08, 2020     10/16/2020                        10/16/2020     10/18/2020                        10/20/2020      12/30/2020                   12/30/2020   01/04/2021   Status: Remains inpatient appropriate because:Altered mental status and Unsafe d/c plan: Requirement to discharge to trach capable SNF   Dispo: The patient is from: Home              Anticipated d/c is to: SNF trach capable[place]-we have recently discovered that the [facility] may have a trach bed available in clinicals have been sent to the facility for review -trach team re eval 6/3 and still rec NOT to decannulate 2/2 and lack of appropriate voluntary and involuntary cough response              Patient currently is not medically stable to d/c.   Difficult to place patient Yes   Level of care: Telemetry Medical  Code Status: Full Family Communication: husband Charlene [name] [phone]) is her POA and legal contact.  Charlene [name] was at bedside on 5/18 and given update.  Charlene [name] at bedside 6/6; mother at bedside multiple times this week with the most recent update on 6/17 DVT prophylaxis: Chronically bedbound.  5/6: We will discontinue prophylactic Lovenox   Vaccination status: Unvaccinated-husband still undecided.  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  Subsequently on 2/10, she developed obtundation and respiratory arrest leading to intubation.  She has remained persistently obtunded, now has a trach and a PEG.  Hospital course complicated by persistent fever, Serratia pneumonia and now pre-orbital cellulitis  As of 3/25 with adjustments in hypertonicity medications patient began to improve regarding hypertonicity.  Eventually started on provigil with improvements in alertness.  Evaluated by rehab physician who recommended increasing provigil dose further.  Subsequently patient has become more alert.  Most days she is able to track and with encouragement able to follow simple commands especially since partial obstruction glasses placed.  See progress note dictated 4/27 for expanded details.   2/09 Admit for [place] 2/10 EVD placed, later intubated due to obtundation and respiratory arrest 2/11 MRI Brain, hypertonic saline d/c'd, EVD not working.  Husband did not consent to PICC  2/13 EVD removed 2/17 [place] discussion with family, PMT 2/24 Bedside trach and PEG 3/1 Transferred to [facility]  Subjective: Patient very alert upon my entry into the room.  Bedside providers had recently bathed, turned and repositioned her which may have caused some transient discomfort and she was frowning.  Eventually she returned back to her baseline mentation and was very interactive and attempting to phonate although her voice was whispered and words were unintelligible.  When I discussed with her mother at bedside that may be Charlene [name] could utilize a picture board and point to pictures to help communicate Charlene [name] was observed lifting up her index finger and attempting to point.  Objective: Vitals:   01/05/21 0353 01/05/21 0456  BP: (!) 106/59 128/76  Pulse: 83 88  Resp: 16 12  Temp:  98.4 F (36.9 C)  SpO2: 100% 98%    Intake/Output Summary (Last 24 hours) at 01/05/2021 0745 Last data filed at 01/05/2021 0413 Gross per 24 hour   Intake --  Output 300 ml  Net -300 ml    Filed Weights   12/29/20 0500 01/02/21 2228 01/03/21 0500  Weight: 53.6 kg 54.8 kg 54.3 kg    Exam:       Gen: Alert, pleasant and quite interactive today. Respiratory: #4.0 cuffless trach, trach collar with FiO2 21% at 5L, anterior lung sounds are clear, no increased work of breathing. Cardiovascular: Normal heart sounds, no tachycardia, no peripheral edema Abdomen:  PEG for tube feeding-LBM 6/17.  Soft and nontender with normoactive bowel sounds. Musculoskeletal: Bilateral atrophy of thigh and calf muscles-continues to have some right knee pain that is worse with extension and upon awakening Neurologic: Cranial nerves intact except for subtle left facial droop.  Continues to have right upper extremity dominant movement with a combination of some fine motor as evidenced by ability to grasp and point finger proximal strength is gross in nature but is quite purposeful.  For the most part she keeps her left arm flexed but is able to use it some but it is extremely weak at 1-2/5.  She keeps her knees and hips flexed in context of stiffness and discomfort when extended but after massage and slow repetitive extension can be extended.  Minimal true purposeful movement noted and does not move legs on command. Psychiatric: Alert and attempting to phonate but words are unintelligible   Assessment/Plan: Acute problems: Multiple acute strokes  Right Thalamus ICH with IVH  Obstructive Hydrocephalus/Acute on persistent metabolic Encephalopathy/Non TBI related hypertonicity -Continue baclofen and tizanidine for spasticity. Continue Provigil and therapies -Reevaluated by CIR on 5/2 with SNF recommended based on lack of any meaningful improvement at that time  -Her brain injury involves corona radiata, midbrain with associated midline shift and right basal ganglia.  These areas correlate with awareness and purposeful movement -SLP notified that patient may be  able to communicate using a picture board or other assistive device that would allow her to the point to communicate  Tracheostomy dependent -Provoked cough via deep suction completed while patient fatigued demonstrated no evidence of cough  -When patient more alert did have a weak provoke cough with deep suctioning -6/16 [name] d/w her husband. Plan is to re evaluate efficacy of decannulation in 2 weeks; at best she is weeks to months away from safe decannulation. Her very slow recovery trajectory is concerning for requirement for permanent trach/no decannulation. 6/20 reevaluated by trach team.  Per their documentation continue PMV noting she is not yet a candidate for capping  Colonic Ileus/recurrent constipation/loose stools -Continue Miralax prn and fiber supplementation  Hypertensive emergency/Malignant HTN -Continue Norvasc, Lopressor and hydralazine.  Dysphagia -Continue tube feedings, Prosource liquid, free water -Continue supervised pured diet allowing family to administer orals after adequate training  History of urinary retention -Continue Cardura  Other problems: Right upper extremity thrombophlebitis - [name] -> age indeterminate SVT of right cephalic vein  Neck Contracture/torticollis -- Resolved  ESBL positive Enterobacter UTI -Has completed 5 days of meropenem -Contact isolation during hospitalization -continue Oxybutynin suspected bladder spasms  Klebsiella UTI -Resistant to nitrofurantoin and ampicillin -Meropenem transitioned to Rocephin on 5/22 -To minimize risk of recurrent UTI have asked that pure wick only be used at bedtime on a 10 PM to 8 AM schedule  Recurrent diarrhea -Resolved -Likely combination of recent antibiotics as well as scheduled laxative -MiraLAX now as needed  Bacterial Conjunctivitis /periorbital cellulitis -Resolved  Chronic pansinusitis/recurrent fevers 2/2 aspiration PNA;History of Serratia Pneumonia  Hospital Acquired  Pneumonia:  -Completed several courses of antibiotic -Follow-up imaging including sinus CT consistent with chronic but stable and resolving sinusitis   Anemia -Stable  HLD -Continue Lipitor  Bilateral knee osteoarthritis/reported ligamentous injury to right knee/right hip pain -Has completed a short course of prednisone, also treated with scheduled Tylenol and NSAIDs. -History of remote MCL tear. CT right knee on 6/9 with mild tricompartmental osteoarthritis with small joint effusion with findings confirming likely torn MCL -Continue scheduled Ultram noting she appears more comfortable today -Voltaren gel to knees and hip -As of 6/14 pain appears to be controlled    Data Reviewed: Basic Metabolic Panel: No results for input(s): NA, K, CL, CO2, GLUCOSE, BUN, CREATININE, CALCIUM, MG, PHOS in the last 168 hours.  Liver Function Tests: No results for input(s): AST, ALT, ALKPHOS, BILITOT, PROT, ALBUMIN in the last 168 hours.  CBC: No results for input(s): WBC, NEUTROABS, HGB, HCT, MCV, PLT in the last 168 hours.   CBG: Recent Labs  Lab 01/04/21 1242 01/04/21 1657 01/04/21 1932 01/04/21 2336 01/05/21 0359  GLUCAP 125* 121* 119* 154* 152*     Studies: No results found.   Scheduled Meds:  amLODipine  10 mg Per Tube Daily   atorvastatin  40 mg Per Tube Daily   baclofen  10 mg Per Tube 3 times per day   chlorhexidine gluconate (MEDLINE KIT)  15 mL Mouth Rinse BID   Chlorhexidine Gluconate Cloth  6 each Topical Daily   diclofenac Sodium  4 g Topical QID   doxazosin  2 mg Per Tube Daily   feeding supplement (PROSource TF)  45 mL Per Tube Daily   fiber  1 packet Per Tube BID   free water  200 mL Per Tube Q4H   hydrALAZINE  50 mg Per Tube Q8H   insulin aspart  0-9 Units Subcutaneous Q4H   mouth rinse  15 mL Mouth Rinse 5 X Daily   metoprolol tartrate  100 mg Per Tube BID   modafinil  200 mg Per Tube Daily   tiZANidine  4 mg Per Tube QHS   traMADol  25 mg Per Tube Q6H    Continuous Infusions:  sodium chloride Stopped (10/26/20 1445)   feeding supplement (OSMOLITE 1.2 CAL) 60 mL/hr at 01/03/21 1608    Principal Problem:   ICH (intracerebral hemorrhage) ([place]) Active Problems:   Acute hypoxemic respiratory failure (HCC)   Tracheostomy dependent ([place])   Palliative care by specialist   Muscle hypertonicity   Oropharyngeal dysphagia   Poor prognosis   Aspiration pneumonia of right lower lobe (HCC)   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   Acute cystitis without hematuria   Ileus, unspecified ([place])   Constipation   Consultants: Neurology  Neurosurgery.  Palliative care.   PCCM continues to follow.  No decannulation.   Procedures: Tracheostomy PEG tube EEG Echocardiogram  Antibiotics: Vancomycin 2/12-2/15 Maxipime 2/12-2/15 Ancef 2/19-2/27 Vancomycin 2/20 x 1 dose Maxipime 2/27- 3/5 Vancomycin 3/1-3/3 Ancef 3/6-3/14 Cipro 3/20 4-3/30 Unasyn 3/30-4/4 Rocephin 4/11-4/12 Meropenem 4/12-4/17   Time spent: 25 minutes    Erin Hearing ANP  [facility] 7 am - 330 pm/M-F for direct patient care and secure chat Please refer to [place] for contact info 131  Days

## 2021-01-06 LAB — GLUCOSE, CAPILLARY
Glucose-Capillary: 104 mg/dL — ABNORMAL HIGH (ref 70–99)
Glucose-Capillary: 106 mg/dL — ABNORMAL HIGH (ref 70–99)
Glucose-Capillary: 117 mg/dL — ABNORMAL HIGH (ref 70–99)
Glucose-Capillary: 128 mg/dL — ABNORMAL HIGH (ref 70–99)
Glucose-Capillary: 97 mg/dL (ref 70–99)

## 2021-01-06 NOTE — Plan of Care (Signed)
  Problem: Self-Care: Goal: Ability to communicate needs accurately will improve Outcome: Progressing   Problem: Nutrition: Goal: Risk of aspiration will decrease Outcome: Progressing   Problem: Intracerebral Hemorrhage Tissue Perfusion: Goal: Complications of Intracerebral Hemorrhage will be minimized Outcome: Progressing   Problem: Clinical Measurements: Goal: Ability to maintain clinical measurements within normal limits will improve Outcome: Progressing Goal: Will remain free from infection Outcome: Progressing Goal: Diagnostic test results will improve Outcome: Progressing Goal: Respiratory complications will improve Outcome: Progressing Goal: Cardiovascular complication will be avoided Outcome: Progressing   Problem: Coping: Goal: Level of anxiety will decrease Outcome: Progressing   Problem: Elimination: Goal: Will not experience complications related to urinary retention Outcome: Progressing   Problem: Pain Managment: Goal: General experience of comfort will improve Outcome: Progressing   Problem: Skin Integrity: Goal: Risk for impaired skin integrity will decrease Outcome: Progressing   Problem: Intracerebral Hemorrhage Tissue Perfusion: Goal: Complications of Intracerebral Hemorrhage will be minimized Outcome: Progressing   Problem: Ischemic Stroke/TIA Tissue Perfusion: Goal: Complications of ischemic stroke/TIA will be minimized Outcome: Progressing

## 2021-01-06 NOTE — Progress Notes (Signed)
[facility] PROGRESS NOTE  Charlene [name] FEO:[id number] DOB: 06-03-1966 DOA: 08/26/2020 PCP: Patient, [name] (Inactive)    October 08, 2020     10/16/2020                        10/16/2020     10/18/2020                        10/20/2020      12/30/2020                   12/30/2020   01/04/2021   Status: Remains inpatient appropriate because:Altered mental status and Unsafe d/c plan: Requirement to discharge to trach capable SNF   Dispo: The patient is from: Home              Anticipated d/c is to: SNF trach capable-[place] available-we have recently discovered that the [facility] may have a trach bed available in clinicals have been sent to the facility for review -trach team re eval 6/3 and still rec NOT to decannulate 2/2 and lack of appropriate voluntary and involuntary cough response              Patient currently is not medically stable to d/c.   Difficult to place patient Yes   Level of care: Telemetry Medical  Code Status: Full Family Communication: [name] [phone]) is her POA and legal contact.  [name] was at bedside on 5/18 and given update.  [name] at bedside 6/6; mother at bedside multiple times this week with the most recent update on 6/17 DVT prophylaxis: Chronically bedbound.  5/6: We will discontinue prophylactic Lovenox   Vaccination status: Unvaccinated-husband still undecided.  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  Subsequently on 2/10, she developed obtundation and respiratory arrest leading to intubation.  She has remained persistently obtunded, now has a trach and a PEG.  Hospital course complicated by persistent fever, Serratia pneumonia and now pre-orbital cellulitis  As of 3/25 with adjustments in hypertonicity medications patient began to improve regarding hypertonicity.  Eventually started on provigil with improvements in alertness.  Evaluated by rehab physician who recommended increasing provigil dose further.  Subsequently patient has become more alert.  Most days she is able to track and with encouragement able to follow simple commands especially since partial obstruction glasses placed.  See progress note dictated 4/27 for expanded details.   2/09 Admit for [place] 2/10 EVD placed, later intubated due to obtundation and respiratory arrest 2/11 MRI Brain, hypertonic saline d/c'd, EVD not working.  Husband did not consent to PICC  2/13 EVD removed 2/17 [place] discussion with family, PMT 2/24 Bedside trach and PEG 3/1 Transferred to [facility]  Subjective: Had just awakened today.  Currently not engaging or tracking well but this is not unusual for her when she first awakens.  Objective: Vitals:   01/06/21 0805 01/06/21  0806  BP:    Pulse:    Resp:    Temp: 98.9 F (37.2 C) 98.9 F (37.2 C)  SpO2:      Intake/Output Summary (Last 24 hours) at 01/06/2021 0821 Last data filed at 01/06/2021 0544 Gross per 24 hour  Intake 4852 ml  Output 1200 ml  Net 3652 ml    Filed Weights   01/02/21 2228 01/03/21 0500 01/06/21 0009  Weight: 54.8 kg 54.3 kg 55.7 kg    Exam:       Gen: Stable flat affect.  Just awakened.  Appears to be in no acute distress Respiratory: #4.0 cuffless trach, trach collar with FiO2 21% at 5L, anterior lung sounds remain clear.  No coughing or tracheal secretions noted Cardiovascular: Normotensive, normal heart sounds, no peripheral  edema. Abdomen:  PEG for tube feeding-LBM 6/18.  Abdomen soft and nontender on examination with normoactive bowel sounds. Musculoskeletal: Bilateral atrophy of thigh and calf muscles-continues to have some right knee pain that is worse with extension and upon awakening Neurologic: At baseline has left facial droop.  Definite purposeful movement of right upper extremity with less movement of left upper extremity.  No spasticity noted although early in a.m. does have significant lower extremity stiffness. Psychiatric: Awake no definitive interaction or attempts to phonate at this time but she had just awakened and is more sleepy than baseline.  She also had a very busy day with therapist yesterday and likely is requiring brain recovery time.   Assessment/Plan: Acute problems: Multiple acute strokes  Right Thalamus ICH with IVH  Obstructive Hydrocephalus/Acute on persistent metabolic Encephalopathy/Non TBI related hypertonicity -Continue baclofen and tizanidine for spasticity. Continue Provigil and therapies -Reevaluated by CIR on 5/2 with SNF recommended based on lack of any meaningful improvement at that time  -Her brain injury involves corona radiata, midbrain with associated midline shift and right basal ganglia.  These areas correlate with awareness and purposeful movement -SLP 6/21 worked extensively with patient.  She was able to self-feed honey thick liquid from edge of cup with no assistance to hold cup or bring to lips left side spillage improved after utilization of straw.  Clinician said to patient "this is apple juice" with clinician documenting that the patient very clearly repeated "apple juice" at low volume but definitely intelligible  Tracheostomy dependent -Provoked cough via deep suction completed while patient fatigued demonstrated no evidence of cough  -When patient more alert did have a weak provoke cough with deep suctioning -6/16 [name] d/w her husband..She documented that  recovery would be very slow and at best she is weeks to months away from safe decannulation. Her very slow recovery trajectory is concerning for requirement for permanent trach/no decannulation. 6/20 reevaluated by trach team.  Per their documentation continue PMV noting she is not yet a candidate for capping  Colonic Ileus/recurrent constipation/loose stools -Continue Miralax prn and fiber supplementation  Hypertensive emergency/Malignant HTN -Continue Norvasc, Lopressor and hydralazine.  Dysphagia -Continue tube feedings, Prosource liquid, free water -Continue supervised pured diet allowing family to administer orals after adequate training  History of urinary retention -Continue Cardura      Other problems: Right upper extremity thrombophlebitis - [name] -> age indeterminate SVT of right cephalic vein  Neck Contracture/torticollis -- Resolved  ESBL positive Enterobacter UTI -Has completed 5 days of meropenem -Contact isolation during hospitalization -continue Oxybutynin suspected bladder spasms  Klebsiella UTI -Resistant to nitrofurantoin and ampicillin -Meropenem transitioned to Rocephin on 5/22 -To minimize risk of recurrent UTI have  asked that pure wick only be used at bedtime on a 10 PM to 8 AM schedule  Recurrent diarrhea -Resolved -Likely combination of recent antibiotics as well as scheduled laxative -MiraLAX now as needed  Bacterial Conjunctivitis /periorbital cellulitis -Resolved  Chronic pansinusitis/recurrent fevers 2/2 aspiration PNA;History of Serratia Pneumonia  Hospital Acquired Pneumonia:  -Completed several courses of antibiotic -Follow-up imaging including sinus CT consistent with chronic but stable and resolving sinusitis   Anemia -Stable  HLD -Continue Lipitor  Bilateral knee osteoarthritis/reported ligamentous injury to right knee/right hip pain -Has completed a short course of prednisone, also treated with scheduled Tylenol and  NSAIDs. -History of remote MCL tear. CT right knee on 6/9 with mild tricompartmental osteoarthritis with small joint effusion with findings confirming likely torn MCL -Continue scheduled Ultram noting she appears more comfortable today -Voltaren gel to knees and hip -As of 6/14 pain appears to be controlled    Data Reviewed: Basic Metabolic Panel: No results for input(s): NA, K, CL, CO2, GLUCOSE, BUN, CREATININE, CALCIUM, MG, PHOS in the last 168 hours.  Liver Function Tests: No results for input(s): AST, ALT, ALKPHOS, BILITOT, PROT, ALBUMIN in the last 168 hours.  CBC: No results for input(s): WBC, NEUTROABS, HGB, HCT, MCV, PLT in the last 168 hours.   CBG: Recent Labs  Lab 01/05/21 1639 01/05/21 2037 01/06/21 0011 01/06/21 0410 01/06/21 0802  GLUCAP 125* 100* 104* 128* 117*     Studies: No results found.   Scheduled Meds:  amLODipine  10 mg Per Tube Daily   atorvastatin  40 mg Per Tube Daily   baclofen  10 mg Per Tube 3 times per day   chlorhexidine gluconate (MEDLINE KIT)  15 mL Mouth Rinse BID   Chlorhexidine Gluconate Cloth  6 each Topical Daily   diclofenac Sodium  4 g Topical QID   doxazosin  2 mg Per Tube Daily   feeding supplement (PROSource TF)  45 mL Per Tube Daily   fiber  1 packet Per Tube BID   free water  200 mL Per Tube Q4H   hydrALAZINE  50 mg Per Tube Q8H   insulin aspart  0-9 Units Subcutaneous Q4H   mouth rinse  15 mL Mouth Rinse 5 X Daily   metoprolol tartrate  100 mg Per Tube BID   modafinil  200 mg Per Tube Daily   tiZANidine  4 mg Per Tube QHS   traMADol  25 mg Per Tube Q6H   Continuous Infusions:  sodium chloride Stopped (10/26/20 1445)   feeding supplement (OSMOLITE 1.2 CAL) 60 mL/hr at 01/03/21 1608    Principal Problem:   ICH (intracerebral hemorrhage) ([place]) Active Problems:   Acute hypoxemic respiratory failure (HCC)   Tracheostomy dependent ([place])   Palliative care by specialist   Muscle hypertonicity   Oropharyngeal  dysphagia   Poor prognosis   Aspiration pneumonia of right lower lobe (HCC)   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   Acute cystitis without hematuria   Ileus, unspecified ([place])   Constipation   Consultants: Neurology  Neurosurgery.  Palliative care.   PCCM continues to follow.  No decannulation.   Procedures: Tracheostomy PEG tube EEG Echocardiogram  Antibiotics: Vancomycin 2/12-2/15 Maxipime 2/12-2/15 Ancef 2/19-2/27 Vancomycin 2/20 x 1 dose Maxipime 2/27- 3/5 Vancomycin 3/1-3/3 Ancef 3/6-3/14 Cipro 3/20 4-3/30 Unasyn 3/30-4/4 Rocephin 4/11-4/12 Meropenem 4/12-4/17   Time spent: 25 minutes    Erin Hearing ANP  [facility] 7 am - 330 pm/M-F for direct  patient care and secure chat Please refer to [place] for contact info 132  Days

## 2021-01-06 NOTE — Progress Notes (Signed)
OT Cancellation Note  Patient Details Name: Charlene [name] MRN: [id number] DOB: [birth year]   Cancelled Treatment:    Reason Eval/Treat Not Completed: RN in room working on tube feeding device.  OT to continue efforts as appropriate.    Haniel Fix D Loyal[employer] Brashier 01/06/2021, 3:04 PM

## 2021-01-07 LAB — GLUCOSE, CAPILLARY
Glucose-Capillary: 100 mg/dL — ABNORMAL HIGH (ref 70–99)
Glucose-Capillary: 121 mg/dL — ABNORMAL HIGH (ref 70–99)
Glucose-Capillary: 123 mg/dL — ABNORMAL HIGH (ref 70–99)
Glucose-Capillary: 124 mg/dL — ABNORMAL HIGH (ref 70–99)
Glucose-Capillary: 130 mg/dL — ABNORMAL HIGH (ref 70–99)
Glucose-Capillary: 141 mg/dL — ABNORMAL HIGH (ref 70–99)
Glucose-Capillary: 84 mg/dL (ref 70–99)

## 2021-01-07 NOTE — Progress Notes (Signed)
Physical Therapy Treatment Patient Details Name: Charlene [name] MRN: [id number] DOB: [birth year] Today's Date: 01/07/2021    History of Present Illness Pt is 55 y/o female presents to [facility] on 08/26/20 with L sided weakness and headaches. 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 s/p Right frontal ventricular catheter placement on 2/10, stopped functioning on 2/11.  MRI 2/11 shows right greater than left mesial temporal lobes / parahippocampal cortex, bilateral basal ganglia, and possibly the upper pons that are concerning for acute infarcts, most likely secondary to mass effect from the hemorrhage. ETT 2/10-2/24.  Pt with trach and PEG on 09/10/20. Hospital course complicated by persistent fever, Serratia pneumonia and now pre-orbital cellulitis.   PMH includes anxiety, HTN.    PT Comments    Pt tolerated sitting EOB with PT for >15 mins.  We worked on loosening up her stiff trunk, head, neck, and on trying to elicit balance reactions, follow commands, and generally try to support herself in sitting.  3 extremity ROM preformed in supine and soft splints and resting foot splints applied.  Pt left in high chair mode in the bed.  I would love for her next therapy session to be up to the [facility] again (whoever comes next PT/OT).  PT will continue to follow acutely for safe mobility progression   Follow Up Recommendations  SNF     Equipment Recommendations  Wheelchair (measurements PT);Wheelchair cushion (measurements PT);Hospital bed;Other (comment) (18x18 tilt in space)    Recommendations for Other Services Rehab consult     Precautions / Restrictions Precautions Precautions: Fall Precaution Comments: peg and trach Required Braces or Orthoses: Other Brace Other Brace: RLE soft knee brace and LUE soft elbow brace (wearing schedule in RN orders); bil PRAFOs     Mobility  Bed Mobility Overal bed mobility: Needs Assistance Bed Mobility: Rolling;Supine to Sit;Sit to Supine Rolling: Total assist Sidelying to sit: Total assist Supine to sit: Total assist     General bed mobility comments: Total assist for bed mobility and transitions to and from sitting EOB.    Transfers                    Ambulation/Gait                 Stairs             Wheelchair Mobility    Modified Rankin (Stroke Patients Only)       Balance Overall balance assessment: Needs assistance Sitting-balance support: Single extremity supported Sitting balance-Leahy Scale: Poor Sitting balance - Comments: EOB with mod assist, at times min assist, however, no balance reactions when support taken away.  In sitting (~15 mins) we worked on Trunk/head and neck rotations bil, neck extension, attempts at command following and releasing trunk support to attempt to elicit a balance reaction (I think her trunk is too locked down to do much).                                    Cognition Arousal/Alertness: Awake/alert Behavior During Therapy: Flat affect Overall Cognitive Status: Impaired/Different from baseline                                 General Comments:  no change from last session.  I tried to get her to do thumb's up today and she would not even with extensor tone supressed in R UE.      Exercises      General Comments General comments (skin integrity, edema, etc.): In supine we worked on pelvis and lower leg rotation on stabilized trunk x 10 each direction, grimacing when rotating left (her preference is right). knee extension and L eblow extension.  Soft braces appled at end of session as well as bil PRAFOs.  Pt positioned in high chair mode and padded for midline slight left rotated with pollows.      Pertinent Vitals/Pain Pain Assessment: Faces Faces Pain Scale: Hurts even more Pain Location: with ROM To  head, bil legs and L arm Pain Descriptors / Indicators: Grimacing;Guarding Pain Intervention(s): Limited activity within patient's tolerance;Monitored during session;Repositioned    Home Living                      Prior Function            PT Goals (current goals can now be found in the care plan section) Acute Rehab PT Goals Patient Stated Goal: Pt unable to state Progress towards PT goals: Progressing toward goals    Frequency    Min 2X/week      PT Plan Current plan remains appropriate    Co-evaluation              AM-PAC PT "6 Clicks" Mobility   Outcome Measure  Help needed turning from your back to your side while in a flat bed without using bedrails?: Total Help needed moving from lying on your back to sitting on the side of a flat bed without using bedrails?: Total Help needed moving to and from a bed to a chair (including a wheelchair)?: Total Help needed standing up from a chair using your arms (e.g., wheelchair or bedside chair)?: Total Help needed to walk in hospital room?: Total Help needed climbing 3-5 steps with a railing? : Total 6 Click Score: 6    End of Session   Activity Tolerance: Patient tolerated treatment well Patient left: in bed;with call bell/phone within reach;with family/visitor present Nurse Communication: Mobility status;Need for lift equipment PT Visit Diagnosis: Hemiplegia and hemiparesis;Other symptoms and signs involving the nervous system (R29.898);Other abnormalities of gait and mobility (R26.89) Hemiplegia - Right/Left: Right (bil impairments) Hemiplegia - dominant/non-dominant: Dominant Hemiplegia - caused by: Cerebral infarction;Nontraumatic intracerebral hemorrhage;Other Nontraumatic intracranial hemorrhage     Time: 8099-8338 PT Time Calculation (min) (ACUTE ONLY): 27 min  Charges:  $Therapeutic Activity: 23-37 mins                     Corinna Capra, PT, DPT  Acute Rehabilitation Ortho Tech  Supervisor [phone] pager [phone]) [phone] office

## 2021-01-07 NOTE — Progress Notes (Signed)
Nutrition Follow-up  DOCUMENTATION CODES:  Not applicable  INTERVENTION:  Continue Tube feeding via PEG tube: -Osmolite 1.2 at 60 ml/h (1440 ml per day) -45 ml ProSource TF daily   Provides 1768 kcal, 90 gm protein, 1180 ml free water daily   -200 ml free water every 4 hours    NUTRITION DIAGNOSIS:  Inadequate oral intake related to inability to eat as evidenced by NPO status. 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  Subsequently on 2/10, pt developed obtundation and respiratory arrest leading to intubation. Pt is now s/p trach/PEG placement. Hospital course complicated by Serratia pneumonia, ESBL Enterobacter UTI and pre-orbital cellulitis.   2/10 s/p EVD placement and intubation for respiratory arrest 2/13 EVD removed 2/17 palliative care consult; family requesting tx to [facility]  2/24 s/p trach and PEG  3/1 tx to [facility] 3/11 trach changed to [place] #6 cuffless 4/08 pt w/ sepsis 2/2 UTI 4/18 28% ATC/5L 5/2 CIR reevaluated pt and determined she has not made enough improvement for admit; recommended SNF 5/8 trach changed to shiley #4 cuffless  5/23 CCM evaluated and determined pt stilll unable to decannulate due to variable mental status 6/3 trach exchanged, still shiley #4 cuffless  Pt sleeping soundly at time of RD visit. Pt remains NPO. Per RN, pt continues to tolerate TF regimen via PEG: Osmolite 1.2 at 60 ml/h w/ 45 ml ProSource TF daily, 212m free water flushes Q4H.   UOP: 4 unmeasured occurrences x24 hours   Medications: Scheduled Meds:  amLODipine  10 mg Per Tube Daily   atorvastatin  40 mg Per  Tube Daily   baclofen  10 mg Per Tube 3 times per day   chlorhexidine gluconate (MEDLINE KIT)  15 mL Mouth Rinse BID   Chlorhexidine Gluconate Cloth  6 each Topical Daily   diclofenac Sodium  4 g Topical QID   doxazosin  2 mg Per Tube Daily   feeding supplement (PROSource TF)  45 mL Per Tube Daily   fiber  1 packet Per Tube BID   free water  200 mL Per Tube Q4H   hydrALAZINE  50 mg Per Tube Q8H   insulin aspart  0-9 Units Subcutaneous Q4H   mouth rinse  15 mL Mouth Rinse 5 X Daily   metoprolol tartrate  100 mg Per Tube BID   modafinil  200 mg Per Tube Daily   tiZANidine  4 mg Per Tube QHS   traMADol  25 mg Per Tube Q6H  Continuous Infusions:  sodium chloride Stopped (10/26/20 1445)   feeding supplement (OSMOLITE 1.2 CAL) 60 mL/hr at 01/06/21 1700   Labs: CBGs [id number] Diet Order:   Diet Order             Diet NPO time specified  Diet effective now                  EDUCATION NEEDS:  No education needs have been identified at this time  Skin:  Skin Assessment: Reviewed RN Assessment (MASD bilateral groin)  Last BM:  6/18  Height:  Ht Readings from Last 1 Encounters:  10/18/20 '5\' 5"'  (1.651 m)  Weight:  Wt Readings from Last 1 Encounters:  01/07/21 57 kg   Ideal Body Weight:  56.8 kg  BMI:  Body mass index is 20.91 kg/m.  Estimated Nutritional Needs:  Kcal:  1600-1800 Protein:  80-90 grams Fluid:  >1.6L/d    Larkin Ina, MS, RD, LDN RD pager number and weekend/on-call pager number located in [place].

## 2021-01-07 NOTE — Plan of Care (Signed)
  Problem: Nutrition: Goal: Risk of aspiration will decrease Outcome: Progressing   Problem: Intracerebral Hemorrhage Tissue Perfusion: Goal: Complications of Intracerebral Hemorrhage will be minimized Outcome: Progressing   Problem: Clinical Measurements: Goal: Ability to maintain clinical measurements within normal limits will improve Outcome: Progressing Goal: Will remain free from infection Outcome: Progressing Goal: Diagnostic test results will improve Outcome: Progressing Goal: Respiratory complications will improve Outcome: Progressing Goal: Cardiovascular complication will be avoided Outcome: Progressing   Problem: Coping: Goal: Level of anxiety will decrease Outcome: Progressing   Problem: Elimination: Goal: Will not experience complications related to urinary retention Outcome: Progressing   Problem: Pain Managment: Goal: General experience of comfort will improve Outcome: Progressing   Problem: Skin Integrity: Goal: Risk for impaired skin integrity will decrease Outcome: Progressing   Problem: Intracerebral Hemorrhage Tissue Perfusion: Goal: Complications of Intracerebral Hemorrhage will be minimized Outcome: Progressing   Problem: Ischemic Stroke/TIA Tissue Perfusion: Goal: Complications of ischemic stroke/TIA will be minimized Outcome: Progressing

## 2021-01-07 NOTE — Progress Notes (Signed)
[facility] PROGRESS NOTE  Charlene [name]:[id number] DOB: [birth year] DOA: 08/26/2020 PCP: Patient, [name] (Inactive)    October 08, 2020     10/16/2020                        10/16/2020     10/18/2020                        10/20/2020      12/30/2020                   12/30/2020   01/04/2021   Status: Remains inpatient appropriate because:Altered mental status and Unsafe d/c plan: Requirement to discharge to trach capable SNF   Dispo: The patient is from: Home              Anticipated d/c is to: SNF trach capable-[place]-we have recently discovered that the [facility] may have a trach bed available in clinicals have been sent to the facility for review -trach team re eval 6/3 and still rec NOT to decannulate 2/2 and lack of appropriate voluntary and involuntary cough response              Patient currently is not medically stable to d/c.   Difficult to place patient Yes   Level of care: Telemetry Medical  Code Status: Full Family Communication: [name] [phone]) is her POA and legal contact.  [name] was at bedside on 5/18 and given update.  [name] at bedside 6/6; mother at bedside multiple times this week with the most recent update on 6/17 DVT prophylaxis: Chronically bedbound.  5/6: We will discontinue prophylactic Lovenox   Vaccination status: Unvaccinated-husband still undecided.  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  Subsequently on 2/10, she developed obtundation and respiratory arrest leading to intubation.  She has remained persistently obtunded, now has a trach and a PEG.  Hospital course complicated by persistent fever, Serratia pneumonia and now pre-orbital cellulitis  As of 3/25 with adjustments in hypertonicity medications patient began to improve regarding hypertonicity.  Eventually started on provigil with improvements in alertness.  Evaluated by rehab physician who recommended increasing provigil dose further.  Subsequently patient has become more alert.  Most days she is able to track and with encouragement able to follow simple commands especially since partial obstruction glasses placed.  See progress note dictated 4/27 for expanded details.   2/09 Admit for [place] 2/10 EVD placed, later intubated due to obtundation and respiratory arrest 2/11 MRI Brain, hypertonic saline d/c'd, EVD not working.  Husband did not consent to PICC  2/13 EVD removed 2/17 [place] discussion with family, PMT 2/24 Bedside trach and PEG 3/1 Transferred to [facility]  Subjective: Sleeping soundly-did not awaken during exam  Objective: Vitals:   01/07/21 0400 01/07/21 0600  BP: (!) 149/97   Pulse: 77 80  Resp:    Temp:  98.4 F (36.9 C)   SpO2: 98% 98%    Intake/Output Summary (Last 24 hours) at 01/07/2021 0730 Last data filed at 01/06/2021 1700 Gross per 24 hour  Intake 1120 ml  Output --  Net 1120 ml    Filed Weights   01/03/21 0500 01/06/21 0009 01/07/21 0500  Weight: 54.3 kg 55.7 kg 57 kg    Exam:       Gen: Sleeping soundly and in no acute distress Respiratory: #4.0 cuffless trach, trach collar with FiO2 21% at 5L, anterior lung sounds clear.  No increased work of breathing while asleep. Cardiovascular: Normotensive, normal heart sounds, no resting tachycardia and no peripheral edema Abdomen:  PEG for tube feeding-LBM 6/18.  Soft with normoactive bowel  sounds Musculoskeletal: Bilateral atrophy of thigh and calf muscles-continues to have some right knee pain that is worse with extension and upon awakening Neurologic: Currently asleep but at baseline has persistent left facial droop with minimal left upper extremity movement.  Has purposeful right upper extremity movement and was able to independently hold a cup to take sips and SLP evaluation on 6/22 Psychiatric: Sleeping but at baseline very flat affect and inconsistently able to phonate not enough to accurately gauge orientation.  He is able to follow commands slowly.   Assessment/Plan: Acute problems: Multiple acute strokes  Right Thalamus ICH with IVH  Obstructive Hydrocephalus/Acute on persistent metabolic Encephalopathy/Non TBI related hypertonicity -Continue baclofen and tizanidine for spasticity. Continue Provigil and therapies -Reevaluated by CIR (5/2 )-SNF recommended based on lack of any meaningful improvement  -Her brain injury involves corona radiata, midbrain with associated midline shift and right basal ganglia.  These areas correlate with awareness and purposeful movement -SLP 6/21 worked extensively with patient.  She was able to self-feed honey thick liquid from edge of cup with no assistance to hold cup or bring to lips left side spillage improved after utilization of straw.  Clinician said to patient "this is apple juice" with clinician documenting that the patient very clearly repeated "apple juice" at low volume but definitely intelligible  Tracheostomy dependent -Provoked cough via deep suction when patient fatigued demonstrated no cough  -When more alert did have a weak provoked cough with deep suctioning -6/16 [name] d/w her husband..She documented that recovery would be very slow and at best she is weeks to months away from safe decannulation. Her very slow recovery trajectory is concerning for requirement for permanent trach/no decannulation. 6/20 reevaluated by  trach team -not yet a candidate for capping  Colonic Ileus/recurrent constipation/loose stools -Continue Miralax prn and fiber supplementation  Hypertensive emergency/Malignant HTN -Continue Norvasc, Lopressor and hydralazine.  Dysphagia -Continue tube feedings, Prosource liquid, free water -Continue supervised pured diet allowing family to administer orals after adequate training  History of urinary retention -Continue Cardura      Other problems: Right upper extremity thrombophlebitis - [name] -> age indeterminate SVT of right cephalic vein  Neck Contracture/torticollis -- Resolved  ESBL positive Enterobacter UTI -Has completed 5 days of meropenem -Contact isolation during hospitalization -continue Oxybutynin suspected bladder spasms  Klebsiella UTI -Resistant to nitrofurantoin and ampicillin -Meropenem transitioned to Rocephin on 5/22 -To minimize risk of recurrent UTI have asked that pure wick only be used at bedtime on a 10 PM to 8 AM schedule  Recurrent diarrhea -Resolved -Likely combination of recent antibiotics as well as scheduled laxative -MiraLAX now as needed  Bacterial Conjunctivitis /periorbital cellulitis -Resolved  Chronic pansinusitis/recurrent fevers 2/2 aspiration PNA;History of Serratia Pneumonia  Hospital Acquired Pneumonia:  -  Completed several courses of antibiotic -Follow-up imaging including sinus CT consistent with chronic but stable and resolving sinusitis   Anemia -Stable  HLD -Continue Lipitor  Bilateral knee osteoarthritis/reported ligamentous injury to right knee/right hip pain -Has completed a short course of prednisone, also treated with scheduled Tylenol and NSAIDs. -History of remote MCL tear. CT right knee on 6/9 with mild tricompartmental osteoarthritis with small joint effusion with findings confirming likely torn MCL -Continue scheduled Ultram noting she appears more comfortable today -Voltaren gel to knees and hip -As of  6/14 pain appears to be controlled    Data Reviewed: Basic Metabolic Panel: No results for input(s): NA, K, CL, CO2, GLUCOSE, BUN, CREATININE, CALCIUM, MG, PHOS in the last 168 hours.  Liver Function Tests: No results for input(s): AST, ALT, ALKPHOS, BILITOT, PROT, ALBUMIN in the last 168 hours.  CBC: No results for input(s): WBC, NEUTROABS, HGB, HCT, MCV, PLT in the last 168 hours.   CBG: Recent Labs  Lab 01/06/21 0802 01/06/21 1216 01/06/21 1559 01/07/21 0106 01/07/21 0431  GLUCAP 117* 106* 97 141* 124*     Studies: No results found.   Scheduled Meds:  amLODipine  10 mg Per Tube Daily   atorvastatin  40 mg Per Tube Daily   baclofen  10 mg Per Tube 3 times per day   chlorhexidine gluconate (MEDLINE KIT)  15 mL Mouth Rinse BID   Chlorhexidine Gluconate Cloth  6 each Topical Daily   diclofenac Sodium  4 g Topical QID   doxazosin  2 mg Per Tube Daily   feeding supplement (PROSource TF)  45 mL Per Tube Daily   fiber  1 packet Per Tube BID   free water  200 mL Per Tube Q4H   hydrALAZINE  50 mg Per Tube Q8H   insulin aspart  0-9 Units Subcutaneous Q4H   mouth rinse  15 mL Mouth Rinse 5 X Daily   metoprolol tartrate  100 mg Per Tube BID   modafinil  200 mg Per Tube Daily   tiZANidine  4 mg Per Tube QHS   traMADol  25 mg Per Tube Q6H   Continuous Infusions:  sodium chloride Stopped (10/26/20 1445)   feeding supplement (OSMOLITE 1.2 CAL) 60 mL/hr at 01/06/21 1700    Principal Problem:   ICH (intracerebral hemorrhage) ([place]) Active Problems:   Acute hypoxemic respiratory failure (HCC)   Tracheostomy dependent (HCC)   Palliative care by specialist   Muscle hypertonicity   Oropharyngeal dysphagia   Poor prognosis   Aspiration pneumonia of right lower lobe (HCC)   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  Palliative care.   PCCM continues to follow.  No decannulation.   Procedures: Tracheostomy PEG tube EEG Echocardiogram  Antibiotics: Vancomycin 2/12-2/15 Maxipime 2/12-2/15 Ancef 2/19-2/27 Vancomycin 2/20 x 1 dose Maxipime 2/27- 3/5 Vancomycin 3/1-3/3 Ancef 3/6-3/14 Cipro 3/20 4-3/30 Unasyn 3/30-4/4 Rocephin 4/11-4/12 Meropenem 4/12-4/17   Time spent: 25 minutes    Erin Hearing ANP  [facility] 7 am - 330 pm/M-F for direct patient care and secure chat Please refer to [place] for contact info 133  Days

## 2021-01-08 LAB — GLUCOSE, CAPILLARY
Glucose-Capillary: 100 mg/dL — ABNORMAL HIGH (ref 70–99)
Glucose-Capillary: 136 mg/dL — ABNORMAL HIGH (ref 70–99)
Glucose-Capillary: 123 mg/dL — ABNORMAL HIGH (ref 70–99)
Glucose-Capillary: 123 mg/dL — ABNORMAL HIGH (ref 70–99)
Glucose-Capillary: 107 mg/dL — ABNORMAL HIGH (ref 70–99)

## 2021-01-08 NOTE — Progress Notes (Signed)
[facility] PROGRESS NOTE  Charlene [name] DHR:[id number] DOB: March 06, 1966 DOA: 08/26/2020 PCP: Patient, [name] (Inactive)    October 08, 2020     10/16/2020                        10/16/2020     10/18/2020                        10/20/2020      12/30/2020                   12/30/2020   01/04/2021   Status: Remains inpatient appropriate because:Altered mental status and Unsafe d/c plan: Requirement to discharge to trach capable SNF   Dispo: The patient is from: Home              Anticipated d/c is to: SNF trach capable-[place]-we have recently discovered that the [facility] may have a trach bed available in clinicals have been sent to the facility for review -trach team re eval 6/3 and still rec NOT to decannulate 2/2 and lack of appropriate voluntary and involuntary cough response              Patient currently is not medically stable to d/c.   Difficult to place patient Yes   Level of care: Telemetry Medical  Code Status: Full Family Communication: husband Charlene [name] [phone]) is her POA and legal contact.  [name] was at bedside on 5/18 and given update.  [name] at bedside 6/6; mother at bedside multiple times this week with the most recent update on 6/17 DVT prophylaxis: Chronically bedbound.  5/6: We will discontinue prophylactic Lovenox   Vaccination status: Unvaccinated-husband still undecided.  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  Subsequently on 2/10, she developed obtundation and respiratory arrest leading to intubation.  She has remained persistently obtunded, now has a trach and a PEG.  Hospital course complicated by persistent fever, Serratia pneumonia and now pre-orbital cellulitis  As of 3/25 with adjustments in hypertonicity medications patient began to improve regarding hypertonicity.  Eventually started on provigil with improvements in alertness.  Evaluated by rehab physician who recommended increasing provigil dose further.  Subsequently patient has become more alert.  Most days she is able to track and with encouragement able to follow simple commands especially since partial obstruction glasses placed.  See progress note dictated 4/27 for expanded details.   2/09 Admit for [place] 2/10 EVD placed, later intubated due to obtundation and respiratory arrest 2/11 MRI Brain, hypertonic saline d/c'd, EVD not working.  Husband did not consent to PICC  2/13 EVD removed 2/17 [place] discussion with family, PMT 2/24 Bedside trach and PEG 3/1 Transferred to [facility]  Subjective: Awake.  Very interactive with her mother.  Intermittently tracking me around the room.  Objective: Vitals:   01/08/21 0000 01/08/21 0317  BP: (!) 149/89   Pulse:  Resp:    Temp: 98.3 F (36.8 C) 98.4 F (36.9 C)  SpO2:      Intake/Output Summary (Last 24 hours) at 01/08/2021 0813 Last data filed at 01/08/2021 0500 Gross per 24 hour  Intake --  Output 900 ml  Net -900 ml    Filed Weights   01/06/21 0009 01/07/21 0500 01/08/21 0500  Weight: 55.7 kg 57 kg 56 kg    Exam:       Gen: Awake and alert for her.  No acute distress Respiratory: #4.0 cuffless trach, trach collar with FiO2 21% at 5L, lung sounds remain clear.  No increased work of breathing. Cardiovascular: Normal heart sounds, normotensive, no peripheral edema Abdomen:  PEG for tube feeding-LBM 6/23.  Soft nontender nondistended with  normoactive bowel sounds. Musculoskeletal: Bilateral atrophy of thigh and calf muscles-continues to have some right knee pain that is worse with extension and upon awakening Neurologic: Left facial droop, now that she is more awake able to definitely determine that she has significant left-sided weakness with strength 1/5, more improved activity of right side especially RUE with strength of RUE 3+-4/5 with gross motor purposeful activity and some fine motor activity. Psychiatric: Alert, intermittently able to phonate but many times unintelligible.   Assessment/Plan: Acute problems: Multiple acute strokes  Right Thalamus ICH with IVH  Obstructive Hydrocephalus/Acute on persistent metabolic Encephalopathy/Non TBI related hypertonicity -Continue baclofen and tizanidine for spasticity. Continue Provigil and therapies -Reevaluated by CIR (5/2 )-SNF recommended based on lack of any meaningful improvement  -Her brain injury involves corona radiata, midbrain with associated midline shift and right basal ganglia.  These areas correlate with awareness and purposeful movement -Continues to work well and consistently with therapies.  During PT session on 6/23 she was able to sit on side of the bed with assistance for 15 minutes to work on trunk stability.  No apparent issues with dizziness or orthostasis.  Next PT session plan is to get her up in appropriate wheelchair  Tracheostomy dependent -Provoked cough via deep suction when patient fatigued demonstrated no cough  -When more alert did have a weak provoked cough with deep suctioning -6/16 [name] d/w her husband..She documented that recovery would be very slow and at best she is weeks to months away from safe decannulation. Her very slow recovery trajectory is concerning for requirement for permanent trach/no decannulation. 6/20 reevaluated by trach team -not yet a candidate for capping  Colonic Ileus/recurrent constipation/loose stools -Continue  Miralax prn and fiber supplementation  Hypertensive emergency/Malignant HTN -Continue Norvasc, Lopressor and hydralazine.  Dysphagia -Continue tube feedings, Prosource liquid, free water -Continue supervised pured diet allowing family to administer orals after adequate training  History of urinary retention -Continue Cardura      Other problems: Right upper extremity thrombophlebitis - [name] -> age indeterminate SVT of right cephalic vein  Neck Contracture/torticollis -- Resolved  ESBL positive Enterobacter UTI -Has completed 5 days of meropenem -Contact isolation during hospitalization -continue Oxybutynin suspected bladder spasms  Klebsiella UTI -Resistant to nitrofurantoin and ampicillin -Meropenem transitioned to Rocephin on 5/22 -To minimize risk of recurrent UTI have asked that pure wick only be used at bedtime on a 10 PM to 8 AM schedule  Recurrent diarrhea -Resolved -Likely combination of recent antibiotics as well as scheduled laxative -MiraLAX now as needed  Bacterial Conjunctivitis /periorbital cellulitis -Resolved  Chronic pansinusitis/recurrent fevers 2/2 aspiration PNA;History of Serratia Pneumonia  Hospital Acquired Pneumonia:  -Completed several courses of antibiotic -Follow-up imaging including sinus CT consistent with  chronic but stable and resolving sinusitis   Anemia -Stable  HLD -Continue Lipitor  Bilateral knee osteoarthritis/reported ligamentous injury to right knee/right hip pain -Has completed a short course of prednisone, also treated with scheduled Tylenol and NSAIDs. -History of remote MCL tear. CT right knee on 6/9 with mild tricompartmental osteoarthritis with small joint effusion with findings confirming likely torn MCL -Continue scheduled Ultram noting she appears more comfortable today -Voltaren gel to knees and hip -As of 6/14 pain appears to be controlled    Data Reviewed: Basic Metabolic Panel: No results for input(s):  NA, K, CL, CO2, GLUCOSE, BUN, CREATININE, CALCIUM, MG, PHOS in the last 168 hours.  Liver Function Tests: No results for input(s): AST, ALT, ALKPHOS, BILITOT, PROT, ALBUMIN in the last 168 hours.  CBC: No results for input(s): WBC, NEUTROABS, HGB, HCT, MCV, PLT in the last 168 hours.   CBG: Recent Labs  Lab 01/07/21 0747 01/07/21 1151 01/07/21 1600 01/07/21 2000 01/07/21 2336  GLUCAP 121* 100* 84 130* 123*     Studies: No results found.   Scheduled Meds:  amLODipine  10 mg Per Tube Daily   atorvastatin  40 mg Per Tube Daily   baclofen  10 mg Per Tube 3 times per day   chlorhexidine gluconate (MEDLINE KIT)  15 mL Mouth Rinse BID   Chlorhexidine Gluconate Cloth  6 each Topical Daily   diclofenac Sodium  4 g Topical QID   doxazosin  2 mg Per Tube Daily   feeding supplement (PROSource TF)  45 mL Per Tube Daily   fiber  1 packet Per Tube BID   free water  200 mL Per Tube Q4H   hydrALAZINE  50 mg Per Tube Q8H   insulin aspart  0-9 Units Subcutaneous Q4H   mouth rinse  15 mL Mouth Rinse 5 X Daily   metoprolol tartrate  100 mg Per Tube BID   modafinil  200 mg Per Tube Daily   tiZANidine  4 mg Per Tube QHS   traMADol  25 mg Per Tube Q6H   Continuous Infusions:  sodium chloride Stopped (10/26/20 1445)   feeding supplement (OSMOLITE 1.2 CAL) 1,000 mL (01/07/21 1613)    Principal Problem:   ICH (intracerebral hemorrhage) (HCC) Active Problems:   Acute hypoxemic respiratory failure (HCC)   Tracheostomy dependent (HCC)   Palliative care by specialist   Muscle hypertonicity   Oropharyngeal dysphagia   Poor prognosis   Aspiration pneumonia of right lower lobe (HCC)   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  Palliative care.   PCCM continues to follow.  No  decannulation.   Procedures: Tracheostomy PEG tube EEG Echocardiogram  Antibiotics: Vancomycin 2/12-2/15 Maxipime 2/12-2/15 Ancef 2/19-2/27 Vancomycin 2/20 x 1 dose Maxipime 2/27- 3/5 Vancomycin 3/1-3/3 Ancef 3/6-3/14 Cipro 3/20 4-3/30 Unasyn 3/30-4/4 Rocephin 4/11-4/12 Meropenem 4/12-4/17   Time spent: 25 minutes    Erin Hearing ANP  [facility] 7 am - 330 pm/M-F for direct patient care and secure chat Please refer to [place] for contact info 134  Days

## 2021-01-08 NOTE — Progress Notes (Signed)
  Speech Language Pathology Treatment: Dysphagia;Cognitive-Linquistic  Patient Details Name: Charlene [name] MRN: [id number] DOB: May 31, 1966 Today's Date: 01/08/2021 Time: 1030-1050 SLP Time Calculation (min) (ACUTE ONLY): 20 min  Assessment / Plan / Recommendation Clinical Impression  Pt sleepy this am. Her mom, [name], was at bedside.  Repositioned for trial POs; pt fed herself several bites of yogurt with improved oral seal.  Able to clean her lips with min verbal cues.  Tolerated yogurt well with good attention and interest. PMV in place.  She spoke multiple times but volume was low and output was difficult to understand.  Became sleepy and session was ended.  Continue SLP for communication/swallowing.     HPI HPI: Pt is 55 y/o female [facility] with L sided weakness and headaches. PMH includes anxiety, HTN. 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 S/p Right frontal ventricular catheter placement on 2/10, stopped functioning on 2/11.  MRI 2/11 shows right greater than left mesial temporal lobes / parahippocampal cortex, bilateral basal ganglia, and possibly the upper pons that are concerning for acute infarcts, most likely secondary to mass effect from the hemorrhage. ETT 2/10; trach/PEG 09/10/20.  Trach changed to #6 cuffless 3/11; #4 cuffless 5/4. Hospital course complicated by persistent fever,  Serratia pneumonia and pre-orbital cellulitis.      SLP Plan  Continue with current plan of care       Recommendations  Liquids provided via: Cup Medication Administration: Via alternative means Supervision: Trained caregiver to feed patient Compensations: Minimize environmental distractions Postural Changes and/or Swallow Maneuvers: Seated upright 90 degrees      Patient may use Passy-Muir Speech Valve: During all waking hours (remove  during sleep) PMSV Supervision: Intermittent         Oral Care Recommendations: Oral care QID Follow up Recommendations: Skilled Nursing facility SLP Visit Diagnosis: Dysphagia, oropharyngeal phase (R13.12) Plan: Continue with current plan of care       GO               [name] Lasater L. Samson [name], MA CCC/SLP Acute Rehabilitation Services Office number [phone] Pager [phone]  [name] [name] 01/08/2021, 10:56 AM

## 2021-01-09 LAB — GLUCOSE, CAPILLARY: Glucose-Capillary: 135 mg/dL — ABNORMAL HIGH (ref 70–99)

## 2021-01-09 NOTE — Progress Notes (Signed)
PROGRESS NOTE    Charlene [name]  MZT:[id number] DOB: [birth year] DOA: 08/26/2020 PCP: Patient, [name] (Inactive)   Brief Narrative:  55 yo F with 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  Subsequently on 2/10, she developed obtundation and respiratory arrest leading to intubation.  She has remained persistently obtunded, now has a trach and a PEG.  Hospital course complicated by persistent fever, Serratia pneumonia and now pre-orbital cellulitis   As of 3/25 with adjustments in hypertonicity medications patient began to improve regarding hypertonicity.  Eventually started on provigil with improvements in alertness.  Evaluated by rehab physician who recommended increasing provigil dose further.  Subsequently patient has become more alert.  Most days she is able to track and with encouragement able to follow simple commands especially since partial obstruction glasses placed.  See progress note dictated 4/27 for expanded details.   2/09 Admit for [place] 2/10 EVD placed, later intubated due to obtundation and respiratory arrest 2/11 MRI Brain, hypertonic saline d/c'd, EVD not working.  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 Continue Provigil and therapies -Reevaluated by CIR (5/2 )-SNF recommended based on lack of any meaningful improvement -Her brain injury involves corona radiata, midbrain with associated midline shift and right basal ganglia.  These areas correlate with awareness and purposeful movement -Continues to work well and consistently with therapies.  During PT session on 6/23 she was able to sit on side of the bed with assistance for 15 minutes to work on trunk stability.  No apparent issues with dizziness or orthostasis.  Next PT session plan is to get her up in appropriate wheelchair   Tracheostomy dependent -Provoked cough via deep suction when patient fatigued demonstrated no cough -When more alert did have a weak provoked cough with deep suctioning -6/16 [name] d/w her husband..She documented that recovery would be very slow and at best she is weeks to months away from safe decannulation. Her very slow recovery trajectory is concerning for requirement for permanent trach/no decannulation. Candidate for capping this weekend   Colonic Ileus/recurrent constipation/loose stools -Continue Miralax prn and fiber supplementation   Hypertensive emergency/Malignant HTN -Continue Norvasc, Lopressor and hydralazine.   Dysphagia -Continue tube feedings, Prosource liquid, free water -Continue supervised pured diet allowing family to administer orals after adequate training   History of urinary retention -Continue Cardura    Right upper extremity thrombophlebitis - [name] -> age indeterminate SVT of right cephalic vein   Neck Contracture/torticollis -- Resolved   ESBL positive Enterobacter UTI -Has completed 5 days of meropenem -Contact isolation during hospitalization -continue Oxybutynin suspected bladder spasms   Klebsiella UTI -Resistant to nitrofurantoin and  ampicillin -Meropenem transitioned to Rocephin on 5/22 -To minimize  risk of recurrent UTI have asked that pure wick only be used at bedtime on a 10 PM to 8 AM schedule   Recurrent diarrhea -Resolved -Likely combination of recent antibiotics as well as scheduled laxative -MiraLAX now as needed   Bacterial Conjunctivitis /periorbital cellulitis -Resolved   Chronic pansinusitis/recurrent fevers 2/2 aspiration PNA;History of Serratia Pneumonia  Hospital Acquired Pneumonia: -Completed several courses of antibiotic -Follow-up imaging including sinus CT consistent with chronic but stable and resolving sinusitis   Anemia -Stable   HLD -Continue Lipitor   Bilateral knee osteoarthritis/reported ligamentous injury to right knee/right hip pain -Has completed a short course of prednisone, also treated with scheduled Tylenol and NSAIDs. -History of remote MCL tear. CT right knee on 6/9 with mild tricompartmental osteoarthritis with small joint effusion with findings confirming likely torn MCL -Continue scheduled Ultram noting she appears more comfortable today -Voltaren gel to knees and hip -As of 6/14 pain appears to be controlled    DVT prophylaxis: SCD/Compression stockings  Code Status: full    Code Status Orders  (From admission, onward)           Start     Ordered   08/27/20 0026  Full code  Continuous        08/27/20 0026           Code Status History     Date Active Date Inactive Code Status Order ID Comments User Context   02/21/2014 2126 02/23/2014 1359 Full Code [id number]  Swayze, Ava, DO Inpatient      Family Communication: none today Disposition Plan:    The patient is from: Home              Anticipated d/c is to: SNF trach capable-[place] available-we have recently discovered that the [facility] may have a trach bed available, The clinicals have been sent to the facility for review               Patient currently  is not medically stable to d/c.        Difficult to place patient Yes Consults called: None Admission status: Inpatient   Consultants:  Neurology , NSU, PALLIATIVE CARE, PCCM  Procedures:  DG Abd 1 View  Result Date: 12/18/2020 CLINICAL DATA:  55 year old female with distended abdomen, constipation. EXAM: ABDOMEN - 1 VIEW COMPARISON:  None. FINDINGS: Portable AP supine views at 0838 hours. Gastrostomy tube in the mid upper abdomen. Diffuse gas-filled large bowel loops throughout the abdomen and pelvis. Cecum and transverse colon most distended, 7-8 cm. No dilated small bowel. No definite pneumoperitoneum on these supine views. Mild scoliosis. Chronic hip joint degeneration. No acute osseous abnormality identified. IMPRESSION: Diffuse gas-filled large bowel loops throughout the abdomen and pelvis, most compatible with colonic ileus. Electronically Signed   By: Genevie Ann M.D.   On: 12/18/2020 09:03   CT KNEE RIGHT WO CONTRAST  Result Date: 12/23/2020 CLINICAL DATA:  acute on chronic pain-h/o remote MCL tear EXAM: CT OF THE right KNEE WITHOUT CONTRAST TECHNIQUE: Multidetector CT imaging of the right knee was performed according to the standard protocol. Multiplanar CT image reconstructions were also generated. COMPARISON:  None. FINDINGS: Bones/Joint/Cartilage There is no evidence of acute fracture. There is mild tricompartment degenerative changes. Small joint effusion. The knee is held in approximately 90 degrees flexion. Tiny suprapatellar enthesophyte. Ligaments Suboptimally assessed by CT. Muscles and Tendons No muscle atrophy.  No obvious tendon pathology on noncontrast CT. Soft tissues  No focal fluid collection. No evidence of soft tissue mass on noncontrast CT. Poorly visualized posterior horn of the lateral meniscus. IMPRESSION: No evidence of acute fracture. Mild tricompartment osteoarthritis. Small joint effusion. Poorly visualized posterior horn of the lateral meniscus, possibly torn. Consider  MRI as this would be the test of choice to evaluate for internal derangement of the knee. Electronically Signed   By: Maurine Simmering   On: 12/23/2020 13:41   DG Abd Portable 1V  Result Date: 12/31/2020 CLINICAL DATA:  Possible ileus EXAM: PORTABLE ABDOMEN - 1 VIEW COMPARISON:  12/21/2020 FINDINGS: Gastrostomy tube is present. Bowel gas pattern is unremarkable. Stool is primarily present within the rectum; increased density may reflect suggests prior contrast. IMPRESSION: Normal bowel gas pattern.  Retained stool in the rectum. Electronically Signed   By: Macy Mis M.D.   On: 12/31/2020 11:30   DG Abd Portable 1V  Result Date: 12/21/2020 CLINICAL DATA:  Abdominal tightness.  Gastrostomy catheter present EXAM: PORTABLE ABDOMEN - 1 VIEW COMPARISON:  December 18, 2020 FINDINGS: Gastrostomy catheter overlies the stomach in the medial left upper abdomen. There is moderate air in bowel without bowel dilatation. No air-fluid levels. No evident free air on supine examination. No abnormal calcifications. IMPRESSION: Gastrostomy catheter overlies stomach. No findings suggesting bowel obstruction on supine examination. No free air on supine examination. Electronically Signed   By: Lowella Grip III M.D.   On: 12/21/2020 09:14   DG Swallowing Func-Speech Pathology  Result Date: 12/18/2020 Objective Swallowing Evaluation: Type of Study: MBS-Modified Barium Swallow Study  Patient Details Name: ALISSA PHARR MRN: [id number] Date of Birth: 1966-05-18 Today's Date: 12/18/2020 Time: SLP Start Time (ACUTE ONLY): 1340 -SLP Stop Time (ACUTE ONLY): 1425 SLP Time Calculation (min) (ACUTE ONLY): 45 min Past Medical History: Past Medical History: Diagnosis Date  Hypertension  Past Surgical History: Past Surgical History: Procedure Laterality Date  CESAREAN SECTION    ESOPHAGOGASTRODUODENOSCOPY N/A 09/10/2020  Procedure: ESOPHAGOGASTRODUODENOSCOPY (EGD);  Surgeon: Kieth Brightly, Arta Bruce, MD;  Location: [facility];  Service: General;   Laterality: N/A;  PEG PLACEMENT N/A 09/10/2020  Procedure: PERCUTANEOUS ENDOSCOPIC GASTROSTOMY (PEG) PLACEMENT;  Surgeon: Kieth Brightly Arta Bruce, MD;  Location: [facility];  Service: General;  Laterality: N/A; HPI: Pt is 55 y/o female [facility] on 2/9 with L sided weakness and headaches. PMH includes anxiety, HTN. 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 S/p Right frontal ventricular catheter placement on 2/10, stopped functioning on 2/11.  MRI 2/11 shows right greater than left mesial temporal lobes / parahippocampal cortex, bilateral basal ganglia, and possibly the upper pons that are concerning for acute infarcts, most likely secondary to mass effect from the hemorrhage. ETT 2/10; trach/PEG 09/10/20.  Trach changed to #6 cuffless 3/11; #4 cuffless 5/4. Hospital course complicated by persistent fever,  Serratia pneumonia and pre-orbital cellulitis.  Subjective: alert Assessment / Plan / Recommendation CHL IP CLINICAL IMPRESSIONS 12/18/2020 Clinical Impression Pt participated in first instrumental swallow study today since her admission.  Results were promising.  She demonstrated mild difficulty with oral control/cohesion, with piecemeal bolusing of liquids into the pharynx.  Pharyngeal swallow consistently triggered at the level of the pyriforms, which were large and proved to be a good "holding bay" prior to swallow trigger.  Once swallow was initiated, there was consistent epiglottic closure over the larynx.  There was excellent pharyngeal squeeze, which eliminated concerns for  residue between swallows.  There were occasional incidents of trace penetration of liquids over the arytenoids, then hovering on the inferior surface of the vocal folds.  There was one incident of trace aspiration (cup sip of liquids) on posterior trachea- aspiration did elicit a delayed cough  response.  Pt used PMV during study to facilitate cough/sub-glottal pressures. Recommend initiating dysphagia 1/purees with staff and family (upon education). Pt should be sitting upright for all puree consumption. SLP will continue therapeutic trials of nectars/thin liquids. Anticipate pt will continue to demonstrate improvements in overall swallow function. SLP Visit Diagnosis Dysphagia, oropharyngeal phase (R13.12) Attention and concentration deficit following -- Frontal lobe and executive function deficit following -- Impact on safety and function Mild aspiration risk   CHL IP TREATMENT RECOMMENDATION 12/18/2020 Treatment Recommendations Therapy as outlined in treatment plan below   Prognosis 12/18/2020 Prognosis for Safe Diet Advancement Good Barriers to Reach Goals -- Barriers/Prognosis Comment -- CHL IP DIET RECOMMENDATION 12/18/2020 SLP Diet Recommendations Dysphagia 1 (Puree) solids Liquid Administration via -- Medication Administration Via alternative means Compensations Minimize environmental distractions Postural Changes Seated upright at 90 degrees   CHL IP OTHER RECOMMENDATIONS 12/18/2020 Recommended Consults -- Oral Care Recommendations Oral care QID Other Recommendations --   CHL IP FOLLOW UP RECOMMENDATIONS 12/18/2020 Follow up Recommendations Skilled Nursing facility   [facility] IP FREQUENCY AND DURATION 12/18/2020 Speech Therapy Frequency (ACUTE ONLY) min 3x week Treatment Duration 4 weeks      CHL IP ORAL PHASE 12/18/2020 Oral Phase Impaired Oral - Pudding Teaspoon -- Oral - Pudding Cup -- Oral - Honey Teaspoon -- Oral - Honey Cup -- Oral - Nectar Teaspoon Weak lingual manipulation;Piecemeal swallowing;Delayed oral transit Oral - Nectar Cup -- Oral - Nectar Straw -- Oral - Thin Teaspoon Weak lingual manipulation;Piecemeal swallowing;Decreased bolus cohesion Oral - Thin Cup Weak lingual manipulation;Piecemeal swallowing;Decreased bolus cohesion Oral - Thin Straw Weak lingual manipulation;Piecemeal  swallowing;Decreased bolus cohesion Oral - Puree Weak lingual manipulation Oral - Mech Soft -- Oral - Regular -- Oral - Multi-Consistency -- Oral - Pill -- Oral Phase - Comment --  CHL IP PHARYNGEAL PHASE 12/18/2020 Pharyngeal Phase Impaired Pharyngeal- Pudding Teaspoon -- Pharyngeal -- Pharyngeal- Pudding Cup -- Pharyngeal -- Pharyngeal- Honey Teaspoon -- Pharyngeal -- Pharyngeal- Honey Cup -- Pharyngeal -- Pharyngeal- Nectar Teaspoon Delayed swallow initiation-pyriform sinuses;Penetration/Aspiration before swallow;Penetration/Apiration after swallow;Trace aspiration Pharyngeal Material enters airway, CONTACTS cords and not ejected out Pharyngeal- Nectar Cup -- Pharyngeal -- Pharyngeal- Nectar Straw -- Pharyngeal -- Pharyngeal- Thin Teaspoon Delayed swallow initiation-pyriform sinuses Pharyngeal Material does not enter airway Pharyngeal- Thin Cup Delayed swallow initiation-pyriform sinuses;Trace aspiration;Penetration/Aspiration before swallow;Penetration/Apiration after swallow Pharyngeal Material enters airway, passes BELOW cords without attempt by patient to eject out (silent aspiration) Pharyngeal- Thin Straw Other (Comment) Pharyngeal -- Pharyngeal- Puree Delayed swallow initiation-pyriform sinuses Pharyngeal -- Pharyngeal- Mechanical Soft -- Pharyngeal -- Pharyngeal- Regular -- Pharyngeal -- Pharyngeal- Multi-consistency -- Pharyngeal -- Pharyngeal- Pill -- Pharyngeal -- Pharyngeal Comment --  No flowsheet data found. Juan Quam [name] 12/18/2020, 3:04 PM               Antimicrobials:  Vancomycin 2/12-2/15 Maxipime 2/12-2/15 Ancef 2/19-2/27 Vancomycin 2/20 x 1 dose Maxipime 2/27- 3/5 Vancomycin 3/1-3/3 Ancef 3/6-3/14 Cipro 3/20 4-3/30 Unasyn 3/30-4/4 Rocephin 4/11-4/12 Meropenem 4/12-4/17    Subjective: TC CAPPED THIS am, PT WITHOUT ACUTE COMPLAINTS   Objective: Vitals:   01/09/21 0853 01/09/21 1033 01/09/21 1105 01/09/21 1256  BP:  (!) 142/87  126/77  Pulse: 98 89 92 91  Resp: 15  16 17  Temp:    99.1 F (37.3 C)  TempSrc:    Axillary  SpO2: 99%  99% 100%  Weight:      Height:        Intake/Output Summary (Last 24 hours) at 01/09/2021 1510 Last data filed at 01/09/2021 6333 Gross per 24 hour  Intake 4460 ml  Output 1100 ml  Net 3360 ml   Filed Weights   01/06/21 0009 01/07/21 0500 01/08/21 0500  Weight: 55.7 kg 57 kg 56 kg    Examination:  Gen: Awake and alert for her.  No acute distress Respiratory: #4.0 cuffless trach, trach collar capped, lung sounds remain clear.  No increased work of breathing. Cardiovascular: Normal heart sounds, normotensive, no peripheral edema Abdomen:  PEG for tube feeding-LBM 6/23.  Soft nontender nondistended with normoactive bowel sounds. Musculoskeletal: Bilateral atrophy of thigh and calf muscles-continues to have some right knee pain that is worse with extension and upon awakening Neurologic: Left facial droop, now that she is more awake able to definitely determine that she has significant left-sided weakness with strength 1/5, more improved activity of right side especially RUE with strength of RUE 3+-4/5 with gross motor purposeful activity and some fine motor activity. Psychiatric: Alert, intermittently able to phonate     Data Reviewed: I have personally reviewed following labs and imaging studies  CBC: No results for input(s): WBC, NEUTROABS, HGB, HCT, MCV, PLT in the last 168 hours. Basic Metabolic Panel: No results for input(s): NA, K, CL, CO2, GLUCOSE, BUN, CREATININE, CALCIUM, MG, PHOS in the last 168 hours. GFR: CrCl cannot be calculated (Patient's most recent lab result is older than the maximum 21 days allowed.). Liver Function Tests: No results for input(s): AST, ALT, ALKPHOS, BILITOT, PROT, ALBUMIN in the last 168 hours. No results for input(s): LIPASE, AMYLASE in the last 168 hours. No results for input(s): AMMONIA in the last 168 hours. Coagulation Profile: No results for input(s): INR, PROTIME in  the last 168 hours. Cardiac Enzymes: No results for input(s): CKTOTAL, CKMB, CKMBINDEX, TROPONINI in the last 168 hours. BNP (last 3 results) No results for input(s): PROBNP in the last 8760 hours. HbA1C: No results for input(s): HGBA1C in the last 72 hours. CBG: Recent Labs  Lab 01/08/21 0805 01/08/21 1147 01/08/21 1647 01/08/21 2024 01/09/21 0010  GLUCAP 136* 123* 123* 107* 135*   Lipid Profile: No results for input(s): CHOL, HDL, LDLCALC, TRIG, CHOLHDL, LDLDIRECT in the last 72 hours. Thyroid Function Tests: No results for input(s): TSH, T4TOTAL, FREET4, T3FREE, THYROIDAB in the last 72 hours. Anemia Panel: No results for input(s): VITAMINB12, FOLATE, FERRITIN, TIBC, IRON, RETICCTPCT in the last 72 hours. Sepsis Labs: No results for input(s): PROCALCITON, LATICACIDVEN in the last 168 hours.  No results found for this or any previous visit (from the past 240 hour(s)).       Radiology Studies: No results found.      Scheduled Meds:  amLODipine  10 mg Per Tube Daily   atorvastatin  40 mg Per Tube Daily   baclofen  10 mg Per Tube 3 times per day   chlorhexidine gluconate (MEDLINE KIT)  15 mL Mouth Rinse BID   Chlorhexidine Gluconate Cloth  6 each Topical Daily   diclofenac Sodium  4 g Topical QID   doxazosin  2 mg Per Tube Daily   feeding supplement (PROSource TF)  45 mL Per Tube Daily   fiber  1 packet Per Tube BID   free water  200 mL  Per Tube Q4H   hydrALAZINE  50 mg Per Tube Q8H   insulin aspart  0-9 Units Subcutaneous Q4H   mouth rinse  15 mL Mouth Rinse 5 X Daily   metoprolol tartrate  100 mg Per Tube BID   modafinil  200 mg Per Tube Daily   tiZANidine  4 mg Per Tube QHS   traMADol  25 mg Per Tube Q6H   Continuous Infusions:  sodium chloride Stopped (10/26/20 1445)   feeding supplement (OSMOLITE 1.2 CAL) 1,000 mL (01/09/21 0456)     LOS: 135 days    Time spent: 35 min    Nicolette Bang, MD [facility]  If 7PM-7AM, please  contact night-coverage  01/09/2021, 3:10 PM

## 2021-01-10 NOTE — Progress Notes (Signed)
PROGRESS NOTE    Charlene [name]  VOH:[id number] DOB: [birth year] DOA: 08/26/2020 PCP: Patient, [name] (Inactive)   Brief Narrative:  55 yo F with 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  Subsequently on 2/10, she developed obtundation and respiratory arrest leading to intubation.  She has remained persistently obtunded, now has a trach and a PEG.  Hospital course complicated by persistent fever, Serratia pneumonia and now pre-orbital cellulitis   As of 3/25 with adjustments in hypertonicity medications patient began to improve regarding hypertonicity.  Eventually started on provigil with improvements in alertness.  Evaluated by rehab physician who recommended increasing provigil dose further.  Subsequently patient has become more alert.  Most days she is able to track and with encouragement able to follow simple commands especially since partial obstruction glasses placed.  See progress note dictated 4/27 for expanded details.   2/09 Admit for [place] 2/10 EVD placed, later intubated due to obtundation and respiratory arrest 2/11 MRI Brain, hypertonic saline d/c'd, EVD not working.  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 Continue Provigil and therapies -Reevaluated by CIR (5/2 )-SNF recommended based on lack of any meaningful improvement -Her brain injury involves corona radiata, midbrain with associated midline shift and right basal ganglia.  These areas correlate with awareness and purposeful movement -Continues to work well and consistently with therapies.  During PT session on 6/23 she was able to sit on side of the bed with assistance for 15 minutes to work on trunk stability.  No apparent issues with dizziness or orthostasis.  Next PT session plan is to get her up in appropriate wheelchair   Tracheostomy dependent -Provoked cough via deep suction when patient fatigued demonstrated no cough -When more alert did have a weak provoked cough with deep suctioning -6/16 [name] d/w her husband..She documented that recovery would be very slow and at best she is weeks to months away from safe decannulation. Her very slow recovery trajectory is concerning for requirement for permanent trach/no decannulation. Candidate for capping this weekend   Colonic Ileus/recurrent constipation/loose stools -Continue Miralax prn and fiber supplementation   Hypertensive emergency/Malignant HTN -Continue Norvasc, Lopressor and hydralazine.   Dysphagia -Continue tube feedings, Prosource liquid, free water -Continue supervised pured diet allowing family to administer orals after adequate training   History of urinary retention -Continue Cardura    Right upper extremity thrombophlebitis - [name] -> age indeterminate SVT of right cephalic vein   Neck Contracture/torticollis -- Resolved   ESBL positive Enterobacter UTI -Has completed 5 days of meropenem -Contact isolation during hospitalization -continue Oxybutynin suspected bladder spasms   Klebsiella UTI -Resistant to nitrofurantoin and  ampicillin -Meropenem transitioned to Rocephin on 5/22 -To minimize risk  of recurrent UTI have asked that pure wick only be used at bedtime on a 10 PM to 8 AM schedule   Recurrent diarrhea -Resolved -Likely combination of recent antibiotics as well as scheduled laxative -MiraLAX now as needed   Bacterial Conjunctivitis /periorbital cellulitis -Resolved   Chronic pansinusitis/recurrent fevers 2/2 aspiration PNA;History of Serratia Pneumonia  Hospital Acquired Pneumonia: -Completed several courses of antibiotic -Follow-up imaging including sinus CT consistent with chronic but stable and resolving sinusitis   Anemia -Stable   HLD -Continue Lipitor   Bilateral knee osteoarthritis/reported ligamentous injury to right knee/right hip pain -Has completed a short course of prednisone, also treated with scheduled Tylenol and NSAIDs. -History of remote MCL tear. CT right knee on 6/9 with mild tricompartmental osteoarthritis with small joint effusion with findings confirming likely torn MCL -Continue scheduled Ultram noting she appears more comfortable today -Voltaren gel to knees and hip -As of 6/14 pain appears to be controlled    DVT prophylaxis: SCD/Compression stockings  Code Status: FULL    Code Status Orders  (From admission, onward)           Start     Ordered   08/27/20 0026  Full code  Continuous        08/27/20 0026           Code Status History     Date Active Date Inactive Code Status Order ID Comments User Context   02/21/2014 2126 02/23/2014 1359 Full Code [id number]  Swayze, Radene Gunning, DO Inpatient      Family Communication: none today, will call monday Disposition Plan:    The patient is from: Home              Anticipated d/c is to: SNF trach capable-[place] beds available-we have recently discovered that the [facility] may have a trach bed available, The clinicals have been sent to the facility for review               Patient currently is not medically stable to d/c.        Difficult to place patient Yes Consults called: None Admission status: Inpatient     Consultants:  Neurology , NSU, PALLIATIVE CARE, PCCM  Procedures:  DG Abd 1 View  Result Date: 12/18/2020 CLINICAL DATA:  55 year old female with distended abdomen, constipation. EXAM: ABDOMEN - 1 VIEW COMPARISON:  None. FINDINGS: Portable AP supine views at 0838 hours. Gastrostomy tube in the mid upper abdomen. Diffuse gas-filled large bowel loops throughout the abdomen and pelvis. Cecum and transverse colon most distended, 7-8 cm. No dilated small bowel. No definite pneumoperitoneum on these supine views. Mild scoliosis. Chronic hip joint degeneration. No acute osseous abnormality identified. IMPRESSION: Diffuse gas-filled large bowel loops throughout the abdomen and pelvis, most compatible with colonic ileus. Electronically Signed   By: Genevie Ann M.D.   On: 12/18/2020 09:03   CT KNEE RIGHT WO CONTRAST  Result Date: 12/23/2020 CLINICAL DATA:  acute on chronic pain-h/o remote MCL tear EXAM: CT OF THE right KNEE WITHOUT CONTRAST TECHNIQUE: Multidetector CT imaging of the right knee was performed according to the standard protocol. Multiplanar CT image reconstructions were also generated. COMPARISON:  None. FINDINGS: Bones/Joint/Cartilage There is no evidence of acute fracture. There is mild tricompartment degenerative changes. Small joint effusion. The knee is held in approximately 90 degrees flexion. Tiny suprapatellar enthesophyte. Ligaments Suboptimally assessed by CT. Muscles and Tendons No muscle atrophy.  No obvious tendon pathology on noncontrast  CT. Soft tissues No focal fluid collection. No evidence of soft tissue mass on noncontrast CT. Poorly visualized posterior horn of the lateral meniscus. IMPRESSION: No evidence of acute fracture. Mild tricompartment osteoarthritis. Small joint effusion. Poorly visualized posterior horn of the lateral meniscus,  possibly torn. Consider MRI as this would be the test of choice to evaluate for internal derangement of the knee. Electronically Signed   By: Maurine Simmering   On: 12/23/2020 13:41   DG Abd Portable 1V  Result Date: 12/31/2020 CLINICAL DATA:  Possible ileus EXAM: PORTABLE ABDOMEN - 1 VIEW COMPARISON:  12/21/2020 FINDINGS: Gastrostomy tube is present. Bowel gas pattern is unremarkable. Stool is primarily present within the rectum; increased density may reflect suggests prior contrast. IMPRESSION: Normal bowel gas pattern.  Retained stool in the rectum. Electronically Signed   By: Macy Mis M.D.   On: 12/31/2020 11:30   DG Abd Portable 1V  Result Date: 12/21/2020 CLINICAL DATA:  Abdominal tightness.  Gastrostomy catheter present EXAM: PORTABLE ABDOMEN - 1 VIEW COMPARISON:  December 18, 2020 FINDINGS: Gastrostomy catheter overlies the stomach in the medial left upper abdomen. There is moderate air in bowel without bowel dilatation. No air-fluid levels. No evident free air on supine examination. No abnormal calcifications. IMPRESSION: Gastrostomy catheter overlies stomach. No findings suggesting bowel obstruction on supine examination. No free air on supine examination. Electronically Signed   By: Lowella Grip III M.D.   On: 12/21/2020 09:14   DG Swallowing Func-Speech Pathology  Result Date: 12/18/2020 Objective Swallowing Evaluation: Type of Study: MBS-Modified Barium Swallow Study  Patient Details Name: Charlene [name] MRN: [id number] Date of Birth: August 24, 1965 Today's Date: 12/18/2020 Time: SLP Start Time (ACUTE ONLY): 1340 -SLP Stop Time (ACUTE ONLY): 1425 SLP Time Calculation (min) (ACUTE ONLY): 45 min Past Medical History: Past Medical History: Diagnosis Date  Hypertension  Past Surgical History: Past Surgical History: Procedure Laterality Date  CESAREAN SECTION    ESOPHAGOGASTRODUODENOSCOPY N/A 09/10/2020  Procedure: ESOPHAGOGASTRODUODENOSCOPY (EGD);  Surgeon: Kieth Brightly, Arta Bruce, MD;  Location: [facility];  Service: General;  Laterality: N/A;  PEG PLACEMENT N/A 09/10/2020  Procedure: PERCUTANEOUS ENDOSCOPIC GASTROSTOMY (PEG) PLACEMENT;  Surgeon: Kieth Brightly Arta Bruce, MD;  Location: [facility];  Service: General;  Laterality: N/A; HPI: Pt is 55 y/o female presents to [facility] on 2/9 with L sided weakness and headaches. PMH includes anxiety, HTN. 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 S/p Right frontal ventricular catheter placement on 2/10, stopped functioning on 2/11.  MRI 2/11 shows right greater than left mesial temporal lobes / parahippocampal cortex, bilateral basal ganglia, and possibly the upper pons that are concerning for acute infarcts, most likely secondary to mass effect from the hemorrhage. ETT 2/10; trach/PEG 09/10/20.  Trach changed to #6 cuffless 3/11; #4 cuffless 5/4. Hospital course complicated by persistent fever,  Serratia pneumonia and pre-orbital cellulitis.  Subjective: alert Assessment / Plan / Recommendation CHL IP CLINICAL IMPRESSIONS 12/18/2020 Clinical Impression Pt participated in first instrumental swallow study today since her admission.  Results were promising.  She demonstrated mild difficulty with oral control/cohesion, with piecemeal bolusing of liquids into the pharynx.  Pharyngeal swallow consistently triggered at the level of the pyriforms, which were large and proved to be a good "holding bay" prior to swallow trigger.  Once swallow was initiated, there was consistent epiglottic closure over the larynx.  There was excellent pharyngeal squeeze, which  eliminated concerns for residue between swallows.  There were occasional incidents of trace penetration of liquids over the arytenoids, then hovering on the inferior surface of the vocal folds.  There was one incident of trace aspiration (cup sip of liquids) on posterior trachea-  aspiration did elicit a delayed cough response.  Pt used PMV during study to facilitate cough/sub-glottal pressures. Recommend initiating dysphagia 1/purees with staff and family (upon education). Pt should be sitting upright for all puree consumption. SLP will continue therapeutic trials of nectars/thin liquids. Anticipate pt will continue to demonstrate improvements in overall swallow function. SLP Visit Diagnosis Dysphagia, oropharyngeal phase (R13.12) Attention and concentration deficit following -- Frontal lobe and executive function deficit following -- Impact on safety and function Mild aspiration risk   CHL IP TREATMENT RECOMMENDATION 12/18/2020 Treatment Recommendations Therapy as outlined in treatment plan below   Prognosis 12/18/2020 Prognosis for Safe Diet Advancement Good Barriers to Reach Goals -- Barriers/Prognosis Comment -- CHL IP DIET RECOMMENDATION 12/18/2020 SLP Diet Recommendations Dysphagia 1 (Puree) solids Liquid Administration via -- Medication Administration Via alternative means Compensations Minimize environmental distractions Postural Changes Seated upright at 90 degrees   CHL IP OTHER RECOMMENDATIONS 12/18/2020 Recommended Consults -- Oral Care Recommendations Oral care QID Other Recommendations --   CHL IP FOLLOW UP RECOMMENDATIONS 12/18/2020 Follow up Recommendations Skilled Nursing facility   [facility] IP FREQUENCY AND DURATION 12/18/2020 Speech Therapy Frequency (ACUTE ONLY) min 3x week Treatment Duration 4 weeks      CHL IP ORAL PHASE 12/18/2020 Oral Phase Impaired Oral - Pudding Teaspoon -- Oral - Pudding Cup -- Oral - Honey Teaspoon -- Oral - Honey Cup -- Oral - Nectar Teaspoon Weak lingual manipulation;Piecemeal swallowing;Delayed oral transit Oral - Nectar Cup -- Oral - Nectar Straw -- Oral - Thin Teaspoon Weak lingual manipulation;Piecemeal swallowing;Decreased bolus cohesion Oral - Thin Cup Weak lingual manipulation;Piecemeal swallowing;Decreased bolus cohesion Oral - Thin Straw Weak lingual  manipulation;Piecemeal swallowing;Decreased bolus cohesion Oral - Puree Weak lingual manipulation Oral - Mech Soft -- Oral - Regular -- Oral - Multi-Consistency -- Oral - Pill -- Oral Phase - Comment --  CHL IP PHARYNGEAL PHASE 12/18/2020 Pharyngeal Phase Impaired Pharyngeal- Pudding Teaspoon -- Pharyngeal -- Pharyngeal- Pudding Cup -- Pharyngeal -- Pharyngeal- Honey Teaspoon -- Pharyngeal -- Pharyngeal- Honey Cup -- Pharyngeal -- Pharyngeal- Nectar Teaspoon Delayed swallow initiation-pyriform sinuses;Penetration/Aspiration before swallow;Penetration/Apiration after swallow;Trace aspiration Pharyngeal Material enters airway, CONTACTS cords and not ejected out Pharyngeal- Nectar Cup -- Pharyngeal -- Pharyngeal- Nectar Straw -- Pharyngeal -- Pharyngeal- Thin Teaspoon Delayed swallow initiation-pyriform sinuses Pharyngeal Material does not enter airway Pharyngeal- Thin Cup Delayed swallow initiation-pyriform sinuses;Trace aspiration;Penetration/Aspiration before swallow;Penetration/Apiration after swallow Pharyngeal Material enters airway, passes BELOW cords without attempt by patient to eject out (silent aspiration) Pharyngeal- Thin Straw Other (Comment) Pharyngeal -- Pharyngeal- Puree Delayed swallow initiation-pyriform sinuses Pharyngeal -- Pharyngeal- Mechanical Soft -- Pharyngeal -- Pharyngeal- Regular -- Pharyngeal -- Pharyngeal- Multi-consistency -- Pharyngeal -- Pharyngeal- Pill -- Pharyngeal -- Pharyngeal Comment --  No flowsheet data found. Juan Quam [name] 12/18/2020, 3:04 PM               Antimicrobials:  Vancomycin 2/12-2/15 Maxipime 2/12-2/15 Ancef 2/19-2/27 Vancomycin 2/20 x 1 dose Maxipime 2/27- 3/5 Vancomycin 3/1-3/3 Ancef 3/6-3/14 Cipro 3/20 4-3/30 Unasyn 3/30-4/4 Rocephin 4/11-4/12 Meropenem 4/12-4/17    Subjective: No acute events ovenright Resting comfortably in bed  Objective: Vitals:   01/10/21 0410 01/10/21 0500 01/10/21 0926 01/10/21 1111  BP:    (!) 147/93  Pulse: 75   78  Resp: 16     Temp:      TempSrc:      SpO2:   98%   Weight:  56 kg    Height:       No intake or output data in the 24 hours ending 01/10/21 1235 Filed Weights   01/07/21 0500 01/08/21 0500 01/10/21 0500  Weight: 57 kg 56 kg 56 kg    Examination:  Gen: Awake and alert for her.  No acute distress Respiratory: #4.0 cuffless trach, trach collar capped, lung sounds remain clear.  No increased work of breathing. Cardiovascular: Normal heart sounds, normotensive, no peripheral edema Abdomen:  PEG for tube feeding-LBM 6/23.  Soft nontender nondistended with normoactive bowel sounds. Musculoskeletal: Bilateral atrophy of thigh and calf muscles-continues to have some right knee pain that is worse with extension and upon awakening Neurologic: Left facial droop, now that she is more awake able to definitely determine that she has significant left-sided weakness with strength 1/5, more improved activity of right side especially RUE with strength of RUE 3+-4/5 with gross motor purposeful activity and some fine motor activity. Psychiatric: Alert, intermittently able to phonate.     Data Reviewed: I have personally reviewed following labs and imaging studies  CBC: No results for input(s): WBC, NEUTROABS, HGB, HCT, MCV, PLT in the last 168 hours. Basic Metabolic Panel: No results for input(s): NA, K, CL, CO2, GLUCOSE, BUN, CREATININE, CALCIUM, MG, PHOS in the last 168 hours. GFR: CrCl cannot be calculated (Patient's most recent lab result is older than the maximum 21 days allowed.). Liver Function Tests: No results for input(s): AST, ALT, ALKPHOS, BILITOT, PROT, ALBUMIN in the last 168 hours. No results for input(s): LIPASE, AMYLASE in the last 168 hours. No results for input(s): AMMONIA in the last 168 hours. Coagulation Profile: No results for input(s): INR, PROTIME in the last 168 hours. Cardiac Enzymes: No results for input(s): CKTOTAL, CKMB, CKMBINDEX, TROPONINI in the last 168  hours. BNP (last 3 results) No results for input(s): PROBNP in the last 8760 hours. HbA1C: No results for input(s): HGBA1C in the last 72 hours. CBG: Recent Labs  Lab 01/08/21 0805 01/08/21 1147 01/08/21 1647 01/08/21 2024 01/09/21 0010  GLUCAP 136* 123* 123* 107* 135*   Lipid Profile: No results for input(s): CHOL, HDL, LDLCALC, TRIG, CHOLHDL, LDLDIRECT in the last 72 hours. Thyroid Function Tests: No results for input(s): TSH, T4TOTAL, FREET4, T3FREE, THYROIDAB in the last 72 hours. Anemia Panel: No results for input(s): VITAMINB12, FOLATE, FERRITIN, TIBC, IRON, RETICCTPCT in the last 72 hours. Sepsis Labs: No results for input(s): PROCALCITON, LATICACIDVEN in the last 168 hours.  No results found for this or any previous visit (from the past 240 hour(s)).       Radiology Studies: No results found.      Scheduled Meds:  amLODipine  10 mg Per Tube Daily   atorvastatin  40 mg Per Tube Daily   baclofen  10 mg Per Tube 3 times per day   chlorhexidine gluconate (MEDLINE KIT)  15 mL Mouth Rinse BID   Chlorhexidine Gluconate Cloth  6 each Topical Daily   diclofenac Sodium  4 g Topical QID   doxazosin  2 mg Per Tube Daily   feeding supplement (PROSource TF)  45 mL Per Tube Daily   fiber  1 packet Per Tube BID   free water  200 mL Per Tube Q4H   hydrALAZINE  50 mg Per Tube Q8H   insulin aspart  0-9 Units Subcutaneous  Q4H   mouth rinse  15 mL Mouth Rinse 5 X Daily   metoprolol tartrate  100 mg Per Tube BID   modafinil  200 mg Per Tube Daily   tiZANidine  4 mg Per Tube QHS   traMADol  25 mg Per Tube Q6H   Continuous Infusions:  sodium chloride Stopped (10/26/20 1445)   feeding supplement (OSMOLITE 1.2 CAL) 1,000 mL (01/10/21 0245)     LOS: 136 days    Time spent: 35 min    Nicolette Bang, MD [facility]  If 7PM-7AM, please contact night-coverage  01/10/2021, 12:35 PM

## 2021-01-11 ENCOUNTER — Inpatient Hospital Stay (HOSPITAL_COMMUNITY): Payer: Medicaid Other

## 2021-01-11 LAB — GLUCOSE, CAPILLARY
Glucose-Capillary: 109 mg/dL — ABNORMAL HIGH (ref 70–99)
Glucose-Capillary: 113 mg/dL — ABNORMAL HIGH (ref 70–99)
Glucose-Capillary: 115 mg/dL — ABNORMAL HIGH (ref 70–99)
Glucose-Capillary: 117 mg/dL — ABNORMAL HIGH (ref 70–99)
Glucose-Capillary: 118 mg/dL — ABNORMAL HIGH (ref 70–99)
Glucose-Capillary: 128 mg/dL — ABNORMAL HIGH (ref 70–99)
Glucose-Capillary: 131 mg/dL — ABNORMAL HIGH (ref 70–99)
Glucose-Capillary: 134 mg/dL — ABNORMAL HIGH (ref 70–99)
Glucose-Capillary: 137 mg/dL — ABNORMAL HIGH (ref 70–99)
Glucose-Capillary: 140 mg/dL — ABNORMAL HIGH (ref 70–99)
Glucose-Capillary: 141 mg/dL — ABNORMAL HIGH (ref 70–99)
Glucose-Capillary: 145 mg/dL — ABNORMAL HIGH (ref 70–99)
Glucose-Capillary: 146 mg/dL — ABNORMAL HIGH (ref 70–99)
Glucose-Capillary: 178 mg/dL — ABNORMAL HIGH (ref 70–99)
Glucose-Capillary: 97 mg/dL (ref 70–99)
Glucose-Capillary: 113 mg/dL — ABNORMAL HIGH (ref 70–99)
Glucose-Capillary: 131 mg/dL — ABNORMAL HIGH (ref 70–99)
Glucose-Capillary: 117 mg/dL — ABNORMAL HIGH (ref 70–99)

## 2021-01-11 IMAGING — CT CT MAXILLOFACIAL W/O CM
3 of 6 series · 16 of 47 positions shown, 19 images · non-contrast
Comparison: None.

CLINICAL DATA: Dental pain

EXAM:
CT MAXILLOFACIAL WITHOUT CONTRAST
TECHNIQUE: Multidetector CT imaging of the maxillofacial structures was
performed. Multiplanar CT image reconstructions were also generated.

[Series 3: maxilllofacial 2.0 hr40 3 · axial · 0.33mm/px · z∈[+1010,+1144]mm · 11 of 79 slices shown, 14 images]
[im 6/79  brain]
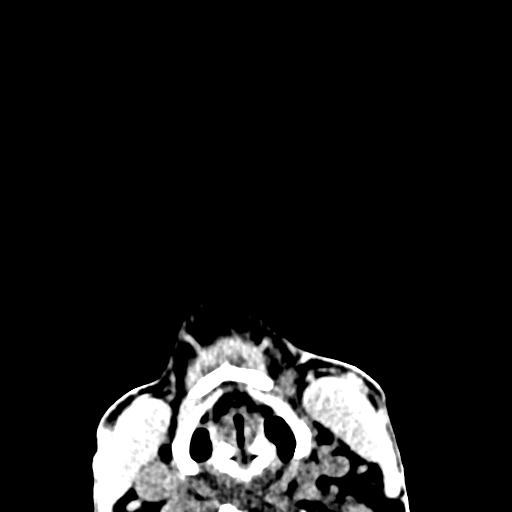
[im 6/79  bone]
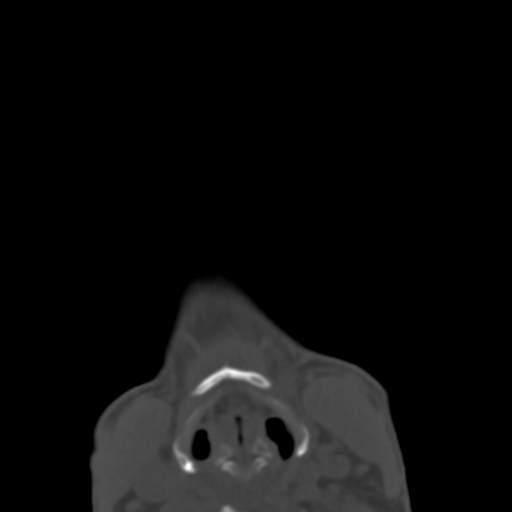
[im 12/79  bone]
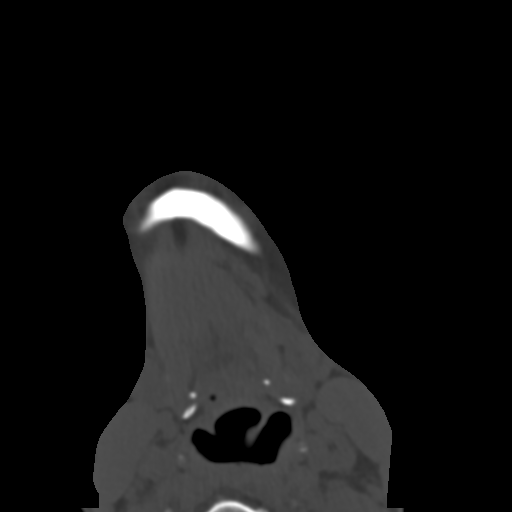
[im 17/79  bone]
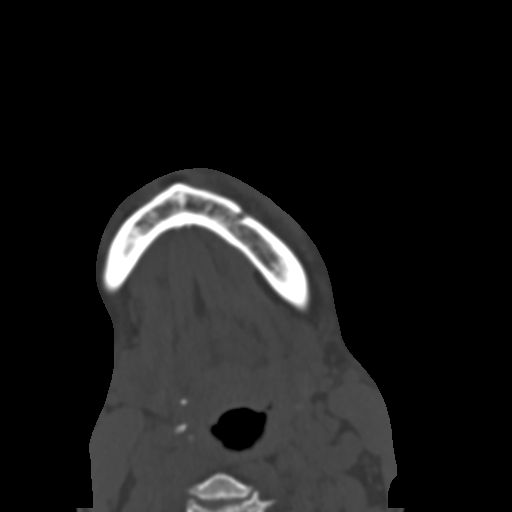
[im 28/79  bone]
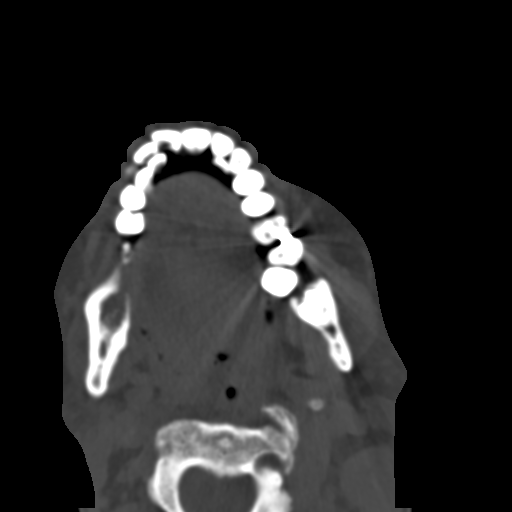
[im 34/79  brain]
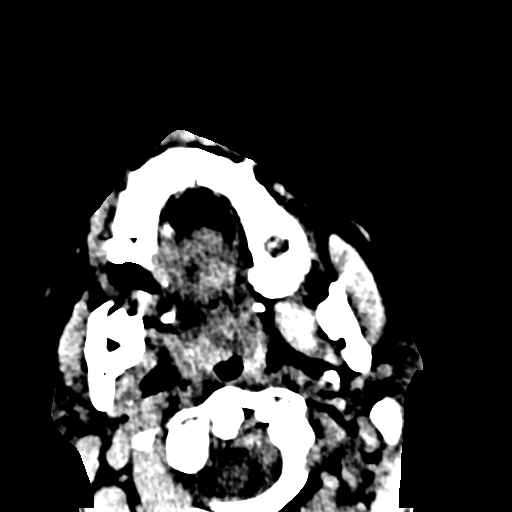
[im 34/79  bone]
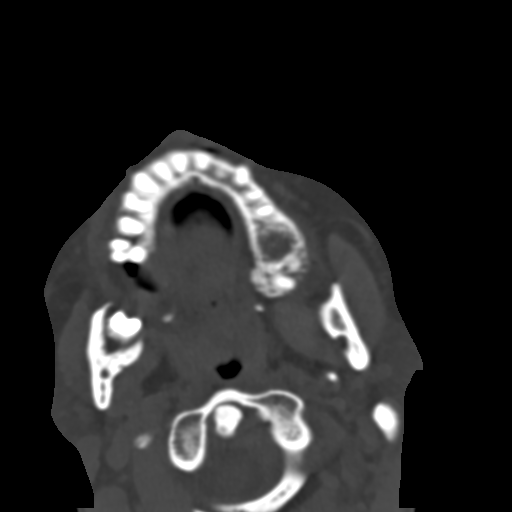
[im 40/79  bone]
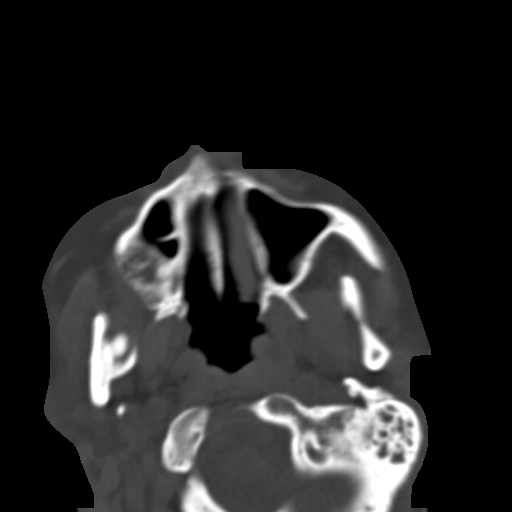
[im 45/79  bone]
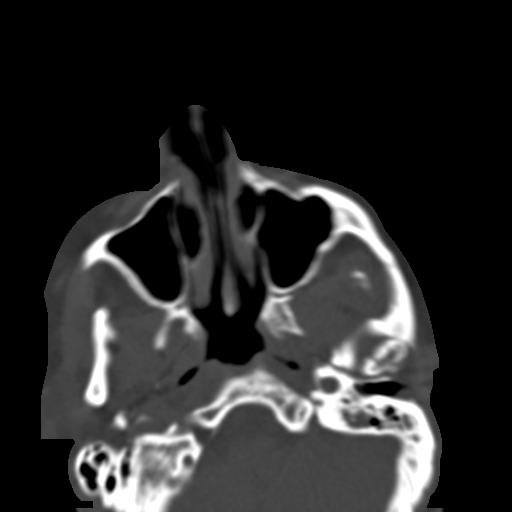
[im 51/79  bone]
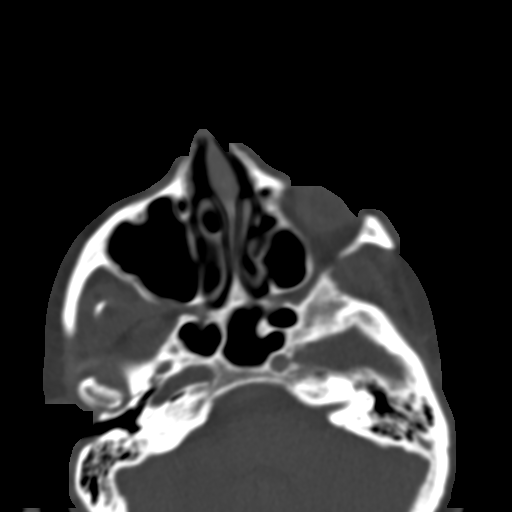
[im 62/79  brain]
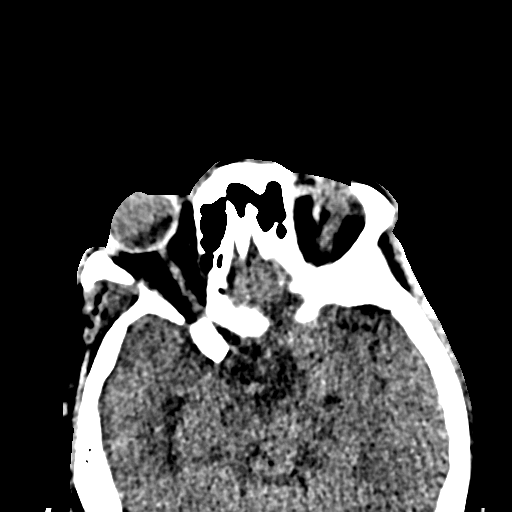
[im 62/79  bone]
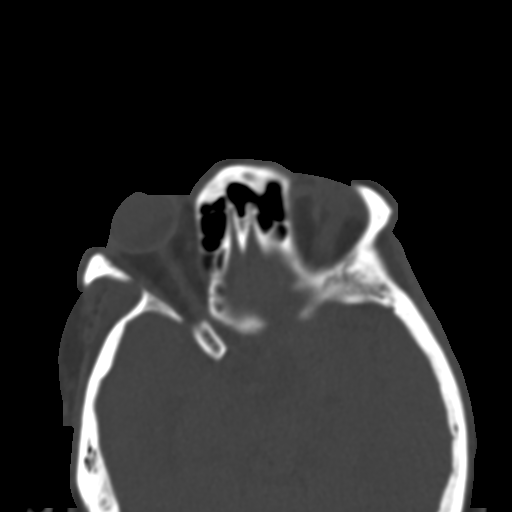
[im 67/79  bone]
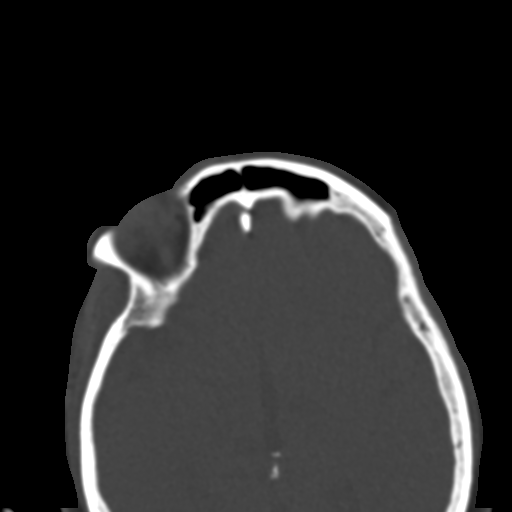
[im 73/79  bone]
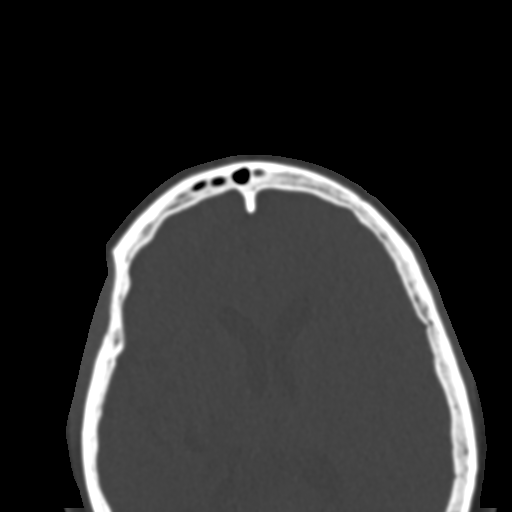

[Series 7: st cor · coronal · 0.32mm/px · 3 of 65 slices shown]
[im 17/65  bone]
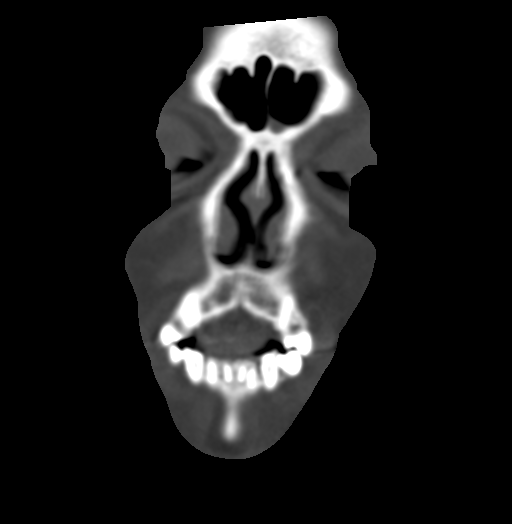
[im 33/65  bone]
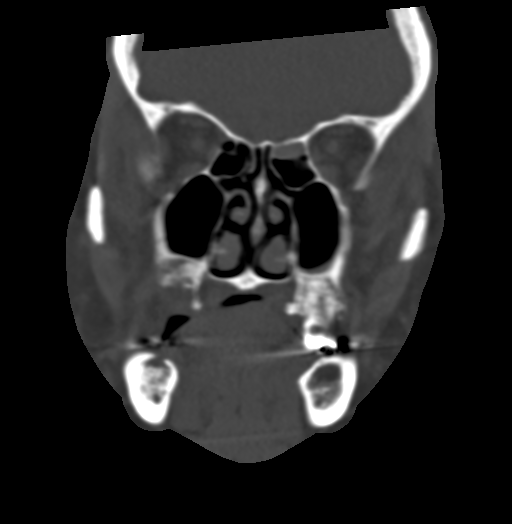
[im 49/65  bone]
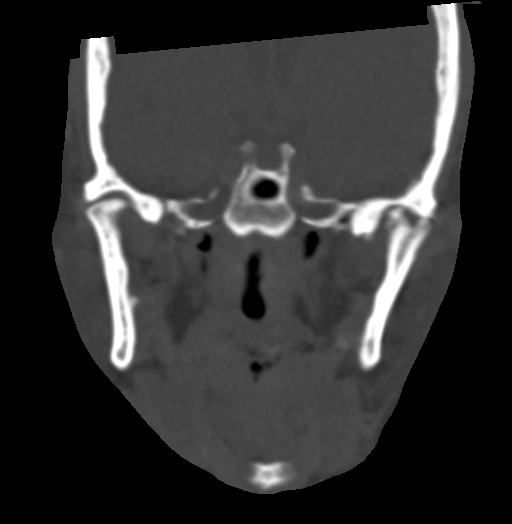

[Series 10: bone sag · sagittal · 0.26mm/px · 2 of 83 slices shown]
[im 28/83  bone]
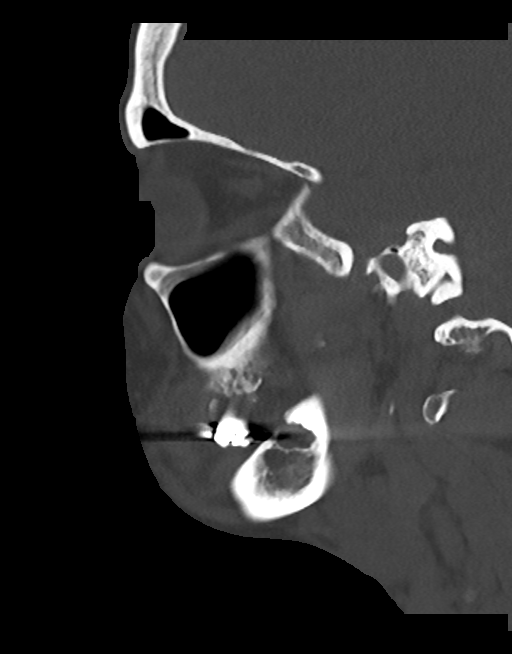
[im 55/83  bone]
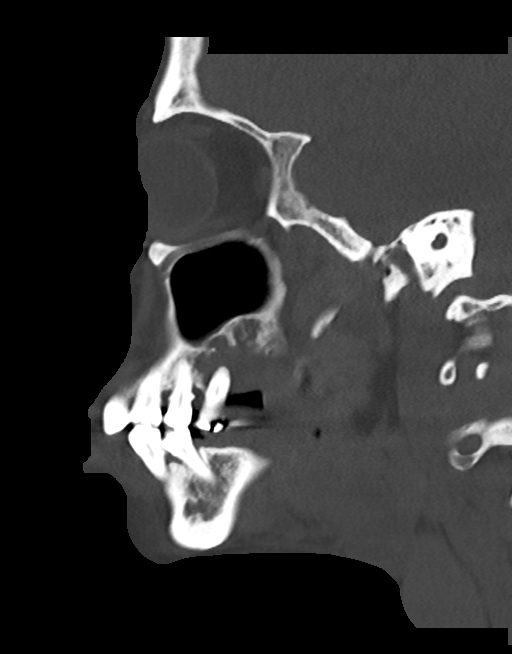

[16 of 47 positions shown; findings below may reference images not displayed]

FINDINGS: Osseous: No facial fracture.

Dental: There is severe dental disease with periapical lucencies of
both maxillary first molars. On the right, there is destruction of
the overlying maxillary cortex. Additionally, there is a large area
of periapical lucency within the unerupted right mandibular third
molar. Lucency extends anteriorly to the expected location of the
roots of the absent first and second molars.

Orbits: The globes are intact. Normal appearance of the intra- and
extraconal fat. Symmetric extraocular muscles.

Sinuses: Mild left maxillary mucosal thickening.

Soft tissues: Normal visualized extracranial soft tissues.

Limited intracranial: Normal.
IMPRESSION: 1. Severe dental disease with periapical lucencies of both maxillary
first molars and the unerupted right mandibular third molar.
2. Osteolysis of the maxillary cortex at right first molar root.
3. No soft tissue abscess or fluid collection.

## 2021-01-11 MED ORDER — IBUPROFEN 100 MG/5ML PO SUSP
400.0000 mg | Freq: Three times a day (TID) | ORAL | Status: DC
  Administered 2021-01-11 – 2021-01-15 (×12): 400 mg via ORAL
  Filled 2021-01-11 (×13): qty 20

## 2021-01-11 MED ORDER — OXYCODONE HCL 5 MG/5ML PO SOLN
2.5000 mg | ORAL | Status: DC | PRN
  Filled 2021-01-11: qty 5

## 2021-01-11 MED ORDER — LACTULOSE 10 GM/15ML PO SOLN
10.0000 g | Freq: Once | ORAL | Status: AC
  Administered 2021-01-11: 10 g via ORAL
  Filled 2021-01-11: qty 30

## 2021-01-11 MED ORDER — BENZOCAINE 10 % MT GEL
Freq: Four times a day (QID) | OROMUCOSAL | Status: DC | PRN
  Administered 2021-01-27: 1 via OROMUCOSAL
  Filled 2021-01-11: qty 9

## 2021-01-11 NOTE — Progress Notes (Signed)
Physical Therapy Treatment Patient Details Name: Charlene [name] MRN: [id number] DOB: 1965-12-21 Today's Date: 01/11/2021    History of Present Illness Pt is 55 y/o female presents to [facility] on 08/26/20 with L sided weakness and headaches. 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 s/p Right frontal ventricular catheter placement on 2/10, stopped functioning on 2/11.  MRI 2/11 shows right greater than left mesial temporal lobes / parahippocampal cortex, bilateral basal ganglia, and possibly the upper pons that are concerning for acute infarcts, most likely secondary to mass effect from the hemorrhage. ETT 2/10-2/24.  Pt with trach and PEG on 09/10/20. Hospital course complicated by persistent fever, Serratia pneumonia and now pre-orbital cellulitis.   PMH includes anxiety, HTN.    PT Comments    Pt progressing slowly towards physical therapy goals. Focus of session was OOB to tilt in space wheelchair. Pt continues to appear very painful with R knee ROM. Gentle stretching was performed however not as aggressive as usual due to significant grimacing, pulling away, and reaching for therapist's hand to push it away. Knee becoming more contracted and is resting in a flexed position often. Will continue to follow and progress as able per POC.    Follow Up Recommendations  SNF     Equipment Recommendations  Wheelchair (measurements PT);Wheelchair cushion (measurements PT);Hospital bed;Other (comment) (18x18 tilt in space)    Recommendations for Other Services Rehab consult     Precautions / Restrictions Precautions Precautions: Fall Precaution Comments: peg and trach Required Braces or Orthoses: Other Brace Other Brace: RLE soft knee brace and LUE soft elbow brace (wearing schedule in RN orders); bil PRAFOs Restrictions Weight Bearing Restrictions: No     Mobility  Bed Mobility Overal bed mobility: Needs Assistance Bed Mobility: Rolling;Sidelying to Sit Rolling: Total assist Sidelying to sit: Total assist       General bed mobility comments: Total assist for all aspects    Transfers Overall transfer level: Needs assistance Equipment used: 2 person hand held assist Transfers: Squat Pivot Transfers;Stand Pivot Transfers Sit to Stand: Total assist;+2 physical assistance;+2 safety/equipment   Squat pivot transfers: Total assist;+2 physical assistance;From elevated surface     General transfer comment: Bed pad utilized under hips for added support. No initiation noted in UE's or LE's to assist in the transfer.  Ambulation/Gait             General Gait Details: non ambulatory   Stairs             Wheelchair Mobility    Modified Rankin (Stroke Patients Only) Modified Rankin (Stroke Patients Only) Pre-Morbid Rankin Score: No symptoms Modified Rankin: Severe disability     Balance Overall balance assessment: Needs assistance Sitting-balance support: Single extremity supported Sitting balance-Leahy Scale: Poor Sitting balance - Comments: EOB with mod assist, at times min assist, however, no balance reactions when support taken away.  In sitting (~15 mins) we worked on Trunk/head and neck rotations bil, neck extension, attempts at command following and releasing trunk support to attempt to elicit a balance reaction (I think her trunk is too locked down to do much). Postural control: Posterior lean;Other (comment) (pushing towards L)                                  Cognition Arousal/Alertness: Awake/alert Behavior During Therapy:  Flat affect Overall Cognitive Status: Impaired/Different from baseline Area of Impairment: Attention;Following commands;Problem solving                 Orientation Level:  (seems to recognize family, especially responsive to her husband, Charlene [name] when he is  here) Current Attention Level: Sustained Memory: Decreased short-term memory Following Commands: Follows one step commands inconsistently;Follows one step commands with increased time Safety/Judgement: Decreased awareness of deficits Awareness: Intellectual Problem Solving: Slow processing;Decreased initiation;Requires verbal cues;Requires tactile cues (multimodal cues, visual) General Comments: Pt was sleeping when PT arrived. Mom reports pt said "thank you" to her earlier today. No verbalizations noted this session. No thumbs up attempted, and no command following.      Exercises Other Exercises Other Exercises: PROM R knee extension    General Comments        Pertinent Vitals/Pain Pain Assessment: Faces Faces Pain Scale: Hurts even more Pain Location: PROM to R knee Pain Descriptors / Indicators: Grimacing;Guarding Pain Intervention(s): Limited activity within patient's tolerance;Monitored during session;Repositioned    Home Living                      Prior Function            PT Goals (current goals can now be found in the care plan section) Acute Rehab PT Goals Patient Stated Goal: Pt unable to state PT Goal Formulation: Patient unable to participate in goal setting Time For Goal Achievement: 01/25/21 Potential to Achieve Goals: Poor Additional Goals Additional Goal #1: Provide positioning, mobility, and transfer education to family and have family participate in these activities during PT Progress towards PT goals: Progressing toward goals    Frequency    Min 2X/week      PT Plan Current plan remains appropriate    Co-evaluation              AM-PAC PT "6 Clicks" Mobility   Outcome Measure  Help needed turning from your back to your side while in a flat bed without using bedrails?: Total Help needed moving from lying on your back to sitting on the side of a flat bed without using bedrails?: Total Help needed moving to and from a bed to a  chair (including a wheelchair)?: Total Help needed standing up from a chair using your arms (e.g., wheelchair or bedside chair)?: Total Help needed to walk in hospital room?: Total Help needed climbing 3-5 steps with a railing? : Total 6 Click Score: 6    End of Session Equipment Utilized During Treatment: Gait belt Activity Tolerance: Patient tolerated treatment well Patient left: in chair;with call bell/phone within reach;with family/visitor present;with nursing/sitter in room Nurse Communication: Mobility status;Need for lift equipment PT Visit Diagnosis: Hemiplegia and hemiparesis;Other symptoms and signs involving the nervous system (R29.898);Other abnormalities of gait and mobility (R26.89) Hemiplegia - Right/Left: Right (bil impairments) Hemiplegia - dominant/non-dominant: Dominant Hemiplegia - caused by: Cerebral infarction;Nontraumatic intracerebral hemorrhage;Other Nontraumatic intracranial hemorrhage     Time: 7741-2878 PT Time Calculation (min) (ACUTE ONLY): 46 min  Charges:  $Therapeutic Activity: 38-52 mins                     Conni Slipper, PT, DPT Acute Rehabilitation Services Pager: [phone] Office: [phone]    Charlene [name] 01/11/2021, 3:15 PM

## 2021-01-11 NOTE — Progress Notes (Signed)
  Speech Language Pathology Treatment: Dysphagia;Passy Muir Speaking valve  Patient Details Name: Charlene [name] MRN: [id number] DOB: Nov 23, 1965 Today's Date: 01/11/2021 Time: 2919-1660 SLP Time Calculation (min) (ACUTE ONLY): 25 min  Assessment / Plan / Recommendation Clinical Impression  Patient seen for skilled ST session focusing on dysphagia and PMV toleration and use. [name] arrived about halfway through session. SLP donned PMV valve which patient tolerated for full 25 minutes without difficulty. As soon as PMV donned, patient vocalizing low volume but then also started pulling at trach collar oxygen mask. When Mom was in room, patient did seem to be trying to verbalize as well but was not intelligible at all. Patient accepted small bites of puree solids and spoon sips of honey thick liquids, however for majority of PO's, she did not exhibit adequate oral movement of bolus and majority of boluses spilled out front of her mouth. In addition, prior to PO's, patient exhibited a lot of anterior spillage/leakage of her saliva. Mom reported that she had given patient a whole cup of blueberry yogurt and she did well with it. Patient would look at [facility] or items on bed, vocalize/groan and sometimes point however unclear her intention.   HPI HPI: Pt is 55 y/o female presents to [facility] on 2/9 with L sided weakness and headaches. PMH includes anxiety, HTN. 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 S/p Right frontal ventricular catheter placement on 2/10, stopped functioning on 2/11.  MRI 2/11 shows right greater than left mesial temporal lobes / parahippocampal cortex, bilateral basal ganglia, and possibly the upper pons that are concerning for acute infarcts, most likely secondary to mass effect from the hemorrhage. ETT 2/10; trach/PEG 09/10/20.  Trach changed to #6  cuffless 3/11; #4 cuffless 5/4. Hospital course complicated by persistent fever,  Serratia pneumonia and pre-orbital cellulitis.      SLP Plan  Continue with current plan of care       Recommendations  Diet recommendations: NPO;Other(comment) (NPO except may have purees from floor stock) Liquids provided via: Cup Medication Administration: Via alternative means Supervision: Trained caregiver to feed patient Compensations: Minimize environmental distractions Postural Changes and/or Swallow Maneuvers: Seated upright 90 degrees      Patient may use Passy-Muir Speech Valve: During all waking hours (remove during sleep) PMSV Supervision: Intermittent         Oral Care Recommendations: Oral care QID;Staff/trained caregiver to provide oral care Follow up Recommendations: Skilled Nursing facility SLP Visit Diagnosis: Dysphagia, oropharyngeal phase (R13.12) Plan: Continue with current plan of care       Angela Nevin, MA, CCC-SLP Speech Therapy [facility] Acute Rehab

## 2021-01-11 NOTE — Progress Notes (Signed)
NAME:  Charlene [name], MRN:  [id number], DOB:  1965/11/26, LOS: 137 ADMISSION DATE:  08/26/2020, CONSULTATION DATE:  2/10 REFERRING MD:  Roda Shutters, CHIEF COMPLAINT:  headache   History of Present Illness:  55 year old female admitted with hypertensive R thalamic/basal ganglia ICH. Resultant obstructive hydrocephalus prompting EVD placement 2/10. Subsequently, 2/10 she had acute mental status change to obtundation and respiratory arrest resulting in intubation. Trach and Peg 2/24. Trach change to #6 cuffless on 3/11.   Pertinent  Medical History  Hypertension Medication non-compliance  Significant Hospital Events: Including procedures, antibiotic start and stop dates in addition to other pertinent events   2/09 Admit for ICH 2/10 EVD placed, later intubated due to obtundation and respiratory arrest 2/11 MRI Brain, hypertonic saline d/c'd, EVD not working.  Husband did not consent to PICC  2/13 EVD removed 2/17 GOC discussion with family, PMT 3/11 trach changed to Shiley 6 uncuffed 4/18 28% ATC / 5L  5/8 downsized trach to 4 5/16 tolerating z 4 well. No real functional improvements since 5/8 5/ 23 no improvement in mental status 5/20 no improvement of mental status   ETT 2/10 >> 09/10/2020 EVD 2/10 > non functioning 2/11 at 1500; clamped 2/12 am > 2/13 Trach (Dr. Denese Killings) 2/24 >>  PEG placement (Dr. Sheliah Hatch) 2/24 >>  CT Head 2/9 >> Acute hemorrhage with the epicenter in the right thalamus. Measurement is approximately 3.2 x 2.1 x 2.5 cm (volume 8.8 cm^3). Intraventricular penetration with blood filling the third and fourth ventricles and nearly filling the right lateral ventricle. No hydrocephalus. CTA Head/Neck 2/10 >> Unchanged size of intraparenchymal hemorrhage centered in the right thalamus and basal ganglia with extension into the right lateral ventricle. 2. No intracranial arterial occlusion or high-grade stenosis. No dissection, aneurysm or hemodynamically significant stenosis of  the carotid or vertebral arteries. CT Head 2/10 >> Stable parenchymal hemorrhage in the right thalamus and basal ganglia with intraventricular extension as described. No new focal abnormality is noted. CT Head 2/10 (post intubation) >> redemonstrated intraparenchymal hemorrhage centered within the R thalamus/basal ganglia extending into the cerebral peduncle/midbrain with associated intraventricular extension and hemorrhage in the R lateral ventricle and fourth ventricle, overall not significantly changed from prior; stable mass effect, 64mm of R to L midline shift, postsurgical changes of ventriculostomy TTE 2/10 >> LVEF 60 to 65%, LV has normal function, no regional wall motion abnormalities, mild concentric LVH.  RV systolic function is normal, normal pulmonary artery systolic pressure.  MRA head 2/11 >>Negative intracranial MR angiography. No evidence of aneurysm or high flow vascular malformation to explain the intraparenchymal hemorrhage  MRI brain 2/11 >> Similar intraparenchymal hemorrhage centered within the right thalamus and basal ganglia extending into the right midbrain with intraventricular extension, effacement of the prepontine cistern and similar 4 mm of leftward midline shift; separate areas of restricted diffusion and edema involving the R > L mesial temporal lobes/parahippocampal cortex, bilateral basal ganglia, and possibly the upper pons that are c/f acute infarcts, most likely secondary to mass effect from hemorrhage. MRI brain 2/19 >> intraventricular clot has decreased in size but mid-brain parenchymal clot has not and there are more ischemic infarcts in the surrounding brain.  CT Head WO Contrast 09/07/20>> Expected evolution of intracranial hemorrhage since 08/30/2020 head CT. Multiple areas of low attenuation corresponding to some of the infarcts seen on prior MRI. Ventricle caliber similar to prior MRI  Interim History / Subjective:  Mom at bedside.  Tolerating PMV.  Mom states  she said "thank  you" earlier today. Remains on 28% FiO2 via ATC.  Objective   Blood pressure 124/76, pulse 88, temperature 98.7 F (37.1 C), temperature source Oral, resp. rate 18, height 5\' 5"  (1.651 m), weight 57.4 kg, last menstrual period 03/03/2014, SpO2 98 %, unknown if currently breastfeeding.    FiO2 (%):  [21 %] 21 %  No intake or output data in the 24 hours ending 01/11/21 1123  Filed Weights   01/08/21 0500 01/10/21 0500 01/11/21 0500  Weight: 56 kg 56 kg 57.4 kg    Examination: General: awake, chronically ill appearing woman with trach HEENT: /AT, eyes anicteric, trach C/D/I Cards: RRR Resp: non labored Abd: soft, ND Ext: no significant edema, muscle wasting. Neuro: awake, tracks, does not follow commands   Labs/imaging that I havepersonally reviewed  (right click and "Reselect all SmartList Selections" daily)  Resp culture 12/17/20: K. Pneumoniae 6/3 Swallow study: mild posterior tracheal aspiration with thin liquids with delayed cough reflex. Mild difficulty with oral control, piecemeal bolusing of liquids into pharynx. Pharyngeal swallow consistently triggered at pyriforms. Consistent eiglottic closure over larynx when swallow triggered. Excellent pharyngeal squeeze. Occasional trace penetrations of liquids over the arytenoids with hovering over inferior vocal folds.   No updated labs for review.   Resolved Hospital Problem list     Assessment & Plan:  Tracheostomy dependence post CVA Severe deconditioning Muscle wasting Poor cough mechanics persist  Plan - Continue routine trach care, no plans for decannulation currently until she is more interactive and is not on supplemental O2 and can prove she has good cough mechanics, secretion clearance, etc. - Continue PMV as tolerated. - Continue PT efforts.   8/3, PA - [facility] For pager details, please see AMION or use Epic chat  After 1900, please call [facility] for  cross coverage needs 01/11/2021, 11:25 AM

## 2021-01-11 NOTE — Progress Notes (Signed)
[facility] PROGRESS NOTE  NIKKIE LIMING JSH:[id number] DOB: 09-23-1965 DOA: 08/26/2020 PCP: Patient, [name] (Inactive)    October 08, 2020     10/16/2020                        10/16/2020     10/18/2020                        10/20/2020      12/30/2020                   12/30/2020   01/04/2021   Status: Remains inpatient appropriate because:Altered mental status and Unsafe d/c plan: Requirement to discharge to trach capable SNF   Dispo: The patient is from: Home              Anticipated d/c is to: SNF trach capable[place]-we have recently discovered that the [facility] may have a trach bed available in clinicals have been sent to the facility for review -trach team re eval 6/3 and still rec NOT to decannulate 2/2 and lack of appropriate voluntary and involuntary cough response              Patient currently is not medically stable to d/c.   Difficult to place patient Yes   Level of care: Telemetry Medical  Code Status: Full Family Communication: [name] [phone]) is her POA and legal contact.  [name] was at bedside on 5/18 and given update.  [name] at bedside 6/6; mother at bedside multiple times this week with the most recent update on 6/17 DVT prophylaxis: Chronically bedbound.  5/6: We will discontinue prophylactic Lovenox   Vaccination status: Unvaccinated-husband still undecided.  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  Subsequently on 2/10, she developed obtundation and respiratory arrest leading to intubation.  She has remained persistently obtunded, now has a trach and a PEG.  Hospital course complicated by persistent fever, Serratia pneumonia and now pre-orbital cellulitis  As of 3/25 with adjustments in hypertonicity medications patient began to improve regarding hypertonicity.  Eventually started on provigil with improvements in alertness.  Evaluated by rehab physician who recommended increasing provigil dose further.  Subsequently patient has become more alert.  Most days she is able to track and with encouragement able to follow simple commands especially since partial obstruction glasses placed.  See progress note dictated 4/27 for expanded details.   2/09 Admit for [place] 2/10 EVD placed, later intubated due to obtundation and respiratory arrest 2/11 MRI Brain, hypertonic saline d/c'd, EVD not working.  Husband did not consent to PICC  2/13 EVD removed 2/17 [place] discussion with family, PMT 2/24 Bedside trach and PEG 3/1 Transferred to [facility]  Subjective: Alert.  Mother at bedside.  Stated that upon her entry into the room she noticed the TV was on and she turned the TV off.  Mother states patient  said "thank you".  Also noticed that Danaysia continues to put her fingers in her mouth and grimace.  She is alternating this with holding her right knee which has known painful osteoarthritis prior to admission.  Objective: Vitals:   01/10/21 2339 01/11/21 0327  BP: (!) 101/59 123/78  Pulse: 72 80  Resp: 15 15  Temp: 98.1 F (36.7 C) 98.2 F (36.8 C)  SpO2: 99% 98%   No intake or output data in the 24 hours ending 01/11/21 0757   Filed Weights   01/08/21 0500 01/10/21 0500 01/11/21 0500  Weight: 56 kg 56 kg 57.4 kg    Exam:       Gen: Alert and at baseline for her.  No acute distress other than apparent dental and right knee pain Respiratory: #4.0 cuffless trach,  trach collar with FiO2 21% at 5L, lungs remain clear.  Unable to perform spontaneous cough upon request.  No tracheal secretions detected. Cardiovascular: Heart sounds are normal, blood pressure normal, soft nonpitting pedal edema which occurs intermittently. Abdomen:  PEG for tube feeding-LBM 6/23.  Abdomen soft and nontender. Musculoskeletal: Bilateral atrophy of thigh and calf muscles-continues to have some right knee pain that is worse with extension and upon awakening Neurologic: Persistent left facial droop.  Disconjugate gaze left eye but attempts to pull to midline and focus.  RU E strength 3+/5 with purposeful movement.  Less purposeful movement RLE.  Left side with significant weakness 1/5 with preference of keeping left arm flexed Psychiatric: Alert and was trying to phonate but responses are rapid and whispered.  Currently told her mother thank you earlier today.  Clearly focusing on this writer while I was speaking about her current clinical status and planned work-up.   Assessment/Plan: Acute problems: Multiple acute strokes  Right Thalamus ICH with IVH  Obstructive Hydrocephalus/Acute on persistent metabolic Encephalopathy/Non TBI related hypertonicity -Continue baclofen and tizanidine for spasticity. Continue Provigil and therapies -Reevaluated by CIR (5/2 )-SNF recommended based on lack of any meaningful improvement  -Her brain injury involves corona radiata, midbrain with associated midline shift and right basal ganglia.  These areas correlate with awareness and purposeful movement -Continues to work well and consistently with therapies.    Tracheostomy dependent -Provoked cough via deep suction when patient fatigued demonstrated no cough  -When more alert did have a weak provoked cough with deep suctioning -6/16 [name] d/w her husband..She documented that recovery would be very slow and at best she is weeks to months away from safe decannulation. Her very slow recovery  trajectory is concerning for requirement for permanent trach/no decannulation. 6/20 reevaluated by trach team -not yet a candidate for capping  Colonic Ileus/recurrent constipation/loose stools -Continue Miralax prn and fiber supplementation -Has not had a bowel movement in 4 days therefore will give Chronulac 10 g per tube x1.  Acute dental pain -Mother states patient still has 2 impacted wisdom teeth -On exam no significant findings during inspection although patient did grimace during palpation of both posterior aspects of upper and lower jaw -Also has history of recurrent sinusitis therefore will obtain limited CT maxillofacial to better evaluate -Orajel QID symptom management  Recurrent right knee pain/known ligamentous injury and chronic osteoarthritis -Patient visibly holding her right knee and grimacing -We will begin scheduled ibuprofen x5 days -Add Oxy elixir 2.5 mg prn for more severe pain -Recent CT imaging without acute findings and current exam without evidence of effusion  Hypertensive emergency/Malignant HTN -Continue Norvasc, Lopressor and hydralazine.  Dysphagia -Continue tube  feedings, Prosource liquid, free water -Continue supervised pured diet allowing family to administer orals after adequate training  History of urinary retention -Continue Cardura      Other problems: Right upper extremity thrombophlebitis - [name] -> age indeterminate SVT of right cephalic vein  Neck Contracture/torticollis -- Resolved  ESBL positive Enterobacter UTI -Has completed 5 days of meropenem -Contact isolation during hospitalization -continue Oxybutynin suspected bladder spasms  Klebsiella UTI -Resistant to nitrofurantoin and ampicillin -Meropenem transitioned to Rocephin on 5/22 -To minimize risk of recurrent UTI have asked that pure wick only be used at bedtime on a 10 PM to 8 AM schedule  Recurrent diarrhea -Resolved -Likely combination of recent antibiotics as  well as scheduled laxative -MiraLAX now as needed  Bacterial Conjunctivitis /periorbital cellulitis -Resolved  Chronic pansinusitis/recurrent fevers 2/2 aspiration PNA;History of Serratia Pneumonia  Hospital Acquired Pneumonia:  -Completed several courses of antibiotic -Follow-up imaging including sinus CT consistent with chronic but stable and resolving sinusitis   Anemia -Stable  HLD -Continue Lipitor      Data Reviewed: Basic Metabolic Panel: No results for input(s): NA, K, CL, CO2, GLUCOSE, BUN, CREATININE, CALCIUM, MG, PHOS in the last 168 hours.  Liver Function Tests: No results for input(s): AST, ALT, ALKPHOS, BILITOT, PROT, ALBUMIN in the last 168 hours.  CBC: No results for input(s): WBC, NEUTROABS, HGB, HCT, MCV, PLT in the last 168 hours.   CBG: Recent Labs  Lab 01/08/21 0805 01/08/21 1147 01/08/21 1647 01/08/21 2024 01/09/21 0010  GLUCAP 136* 123* 123* 107* 135*     Studies: No results found.   Scheduled Meds:  amLODipine  10 mg Per Tube Daily   atorvastatin  40 mg Per Tube Daily   baclofen  10 mg Per Tube 3 times per day   chlorhexidine gluconate (MEDLINE KIT)  15 mL Mouth Rinse BID   Chlorhexidine Gluconate Cloth  6 each Topical Daily   diclofenac Sodium  4 g Topical QID   doxazosin  2 mg Per Tube Daily   feeding supplement (PROSource TF)  45 mL Per Tube Daily   fiber  1 packet Per Tube BID   free water  200 mL Per Tube Q4H   hydrALAZINE  50 mg Per Tube Q8H   insulin aspart  0-9 Units Subcutaneous Q4H   mouth rinse  15 mL Mouth Rinse 5 X Daily   metoprolol tartrate  100 mg Per Tube BID   modafinil  200 mg Per Tube Daily   tiZANidine  4 mg Per Tube QHS   traMADol  25 mg Per Tube Q6H   Continuous Infusions:  sodium chloride Stopped (10/26/20 1445)   feeding supplement (OSMOLITE 1.2 CAL) 1,000 mL (01/11/21 0707)    Principal Problem:   ICH (intracerebral hemorrhage) (HCC) Active Problems:   Acute hypoxemic respiratory failure  (HCC)   Tracheostomy dependent ([place])   Palliative care by specialist   Muscle hypertonicity   Oropharyngeal dysphagia   Poor prognosis   Aspiration pneumonia of right lower lobe (HCC)   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   Acute cystitis without hematuria   Ileus, unspecified ([place])   Constipation   Consultants: Neurology  Neurosurgery.  Palliative care.   PCCM continues to follow.  No decannulation.   Procedures: Tracheostomy PEG tube EEG Echocardiogram  Antibiotics: Vancomycin 2/12-2/15 Maxipime 2/12-2/15 Ancef 2/19-2/27 Vancomycin 2/20 x 1 dose Maxipime 2/27- 3/5 Vancomycin 3/1-3/3 Ancef 3/6-3/14 Cipro 3/20 4-3/30 Unasyn 3/30-4/4 Rocephin 4/11-4/12 Meropenem 4/12-4/17  Time spent: 25 minutes    Erin Hearing ANP  [facility] 7 am - 330 pm/M-F for direct patient care and secure chat Please refer to [place] for contact info 137  Days

## 2021-01-12 LAB — GLUCOSE, CAPILLARY
Glucose-Capillary: 103 mg/dL — ABNORMAL HIGH (ref 70–99)
Glucose-Capillary: 128 mg/dL — ABNORMAL HIGH (ref 70–99)
Glucose-Capillary: 129 mg/dL — ABNORMAL HIGH (ref 70–99)
Glucose-Capillary: 134 mg/dL — ABNORMAL HIGH (ref 70–99)
Glucose-Capillary: 137 mg/dL — ABNORMAL HIGH (ref 70–99)
Glucose-Capillary: 99 mg/dL (ref 70–99)

## 2021-01-12 NOTE — Plan of Care (Signed)
  Problem: Coping: Goal: Will identify appropriate support needs Outcome: Progressing   Problem: Health Behavior/Discharge Planning: Goal: Ability to manage health-related needs will improve Outcome: Progressing   Problem: Intracerebral Hemorrhage Tissue Perfusion: Goal: Complications of Intracerebral Hemorrhage will be minimized Outcome: Progressing   Problem: Ischemic Stroke/TIA Tissue Perfusion: Goal: Complications of ischemic stroke/TIA will be minimized Outcome: Progressing   Problem: Clinical Measurements: Goal: Ability to maintain clinical measurements within normal limits will improve Outcome: Progressing Goal: Will remain free from infection Outcome: Progressing Goal: Diagnostic test results will improve Outcome: Progressing Goal: Respiratory complications will improve Outcome: Progressing Goal: Cardiovascular complication will be avoided Outcome: Progressing

## 2021-01-12 NOTE — Progress Notes (Signed)
[facility] PROGRESS NOTE  Charlene [name] DXI:[id number] DOB: [birth year] DOA: 08/26/2020 PCP: Patient, [name] (Inactive)    October 08, 2020     10/16/2020                        10/16/2020     10/18/2020                        10/20/2020      12/30/2020                   12/30/2020   01/04/2021   Status: Remains inpatient appropriate because:Altered mental status and Unsafe d/c plan: Requirement to discharge to trach capable SNF   Dispo: The patient is from: Home              Anticipated d/c is to: SNF trach capable-[place] available-we have recently discovered that the [facility] may have a trach bed available in clinicals have been sent to the facility for review -trach team re eval 6/3 and still rec NOT to decannulate 2/2 and lack of appropriate voluntary and involuntary cough response              Patient currently is not medically stable to d/c.   Difficult to place patient Yes   Level of care: Telemetry Medical  Code Status: Full Family Communication: [name] [phone]) is her POA and legal contact.  [name] was at bedside on 5/18 and given update.  [name] at bedside 6/6; mother at bedside multiple times this week with the most recent update on 6/17 DVT prophylaxis: Chronically bedbound.  5/6: We will discontinue prophylactic Lovenox   Vaccination status: Unvaccinated-husband still undecided.  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  Subsequently on 2/10, she developed obtundation and respiratory arrest leading to intubation.  She has remained persistently obtunded, now has a trach and a PEG.  Hospital course complicated by persistent fever, Serratia pneumonia and now pre-orbital cellulitis  As of 3/25 with adjustments in hypertonicity medications patient began to improve regarding hypertonicity.  Eventually started on provigil with improvements in alertness.  Evaluated by rehab physician who recommended increasing provigil dose further.  Subsequently patient has become more alert.  Most days she is able to track and with encouragement able to follow simple commands especially since partial obstruction glasses placed.  See progress note dictated 4/27 for expanded details.   2/09 Admit for [place] 2/10 EVD placed, later intubated due to obtundation and respiratory arrest 2/11 MRI Brain, hypertonic saline d/c'd, EVD not working.  Husband did not consent to PICC  2/13 EVD removed 2/17 [place] discussion with family, PMT 2/24 Bedside trach and PEG 3/1 Transferred to [facility]  Subjective: Awake and interactive today.  Able to phonate briefly when I asked her how her teeth were feeling.  Grimaces during CBG check.  Objective: Vitals:   01/12/21 0240  01/12/21 0345  BP:  133/70  Pulse: 82 81  Resp: 18 18  Temp:  98.3 F (36.8 C)  SpO2: 97% 98%   No intake or output data in the 24 hours ending 01/12/21 0739   Filed Weights   01/08/21 0500 01/10/21 0500 01/11/21 0500  Weight: 56 kg 56 kg 57.4 kg    Exam:       Gen: Alert and calm and in no acute distress. Respiratory: #4.0 cuffless trach, trach collar with FiO2 21% at 5L, lungs remain clear.  No increased work of breathing. Cardiovascular: S1-S2.  Pulses regular.  Normotensive.  Subtle bilateral pedal edema. Abdomen:  PEG for tube feeding-LBM 6/23. Soft and nontender with normoactive bowel sounds. Musculoskeletal: Bilateral atrophy of thigh  and calf muscles-right knee pain better today with addition of pharmacotherapy Neurologic: Persistent left facial droop.  Disconjugate gaze left eye but attempts to pull to midline and focus.  RU E strength 3+/5 with purposeful movement.  Less purposeful movement RLE.  Left side with significant weakness 1/5 with preference of keeping left arm flexed Psychiatric: Alert.  Tracking movement around room both with eyes and with turning of head.  Was able to briefly phonate today with some intelligibility.   Assessment/Plan: Acute problems: Multiple acute strokes  Right Thalamus ICH with IVH  Obstructive Hydrocephalus/Acute on persistent metabolic Encephalopathy/Non TBI related hypertonicity -Continue baclofen and tizanidine for spasticity. Continue Provigil and therapies -Reevaluated by CIR (5/2 ) with recommendation for SNF -Her brain injury involves corona radiata, midbrain with associated midline shift and right basal ganglia.  These areas correlate with awareness and purposeful movement -Continues to work well and consistently with therapies.    Tracheostomy dependent -Inconsistent provoked cough effort and when she does cough very weak and basically ineffective -6/16 [name] d/w her husband..She documented that recovery would be very slow and at best she is weeks to months away from safe decannulation. Her very slow recovery trajectory is concerning for requirement for permanent trach/no decannulation. 6/20 reevaluated by trach team -not yet a candidate for capping  Colonic Ileus/recurrent constipation/loose stools -Given one-time dose of Chronulac on 6/27 without results. -If no BM by 6/29 will give 3 days of Chronulac and monitor for results  Acute dental pain -Mother states patient still has 2 impacted wisdom teeth -On exam no significant findings during inspection although patient did grimace during palpation of both posterior aspects of upper and lower jaw -Also has history of  recurrent sinusitis therefore will obtain limited CT maxillofacial to better evaluate -Orajel QID symptom management  Recurrent right knee pain/known ligamentous injury and chronic osteoarthritis -Patient visibly holding her right knee and grimacing -We will begin scheduled ibuprofen x5 days -Add Oxy elixir 2.5 mg prn for more severe pain -Recent CT imaging without acute findings and current exam without evidence of effusion  Hypertensive emergency/Malignant HTN -Continue Norvasc, Lopressor and hydralazine.  Dysphagia -Continue tube feedings, Prosource liquid, free water -Continue supervised pured diet allowing family to administer orals after adequate training  History of urinary retention -Continue Cardura      Other problems: Right upper extremity thrombophlebitis - [name] -> age indeterminate SVT of right cephalic vein  Neck Contracture/torticollis -- Resolved  ESBL positive Enterobacter UTI -Has completed 5 days of meropenem -Contact isolation during hospitalization -continue Oxybutynin suspected bladder spasms  Klebsiella UTI -Resistant to nitrofurantoin and ampicillin -Meropenem transitioned to Rocephin on 5/22 -To minimize risk of recurrent UTI have asked that pure wick only be used at bedtime on  a 10 PM to 8 AM schedule  Recurrent diarrhea -Resolved -Likely combination of recent antibiotics as well as scheduled laxative -MiraLAX now as needed  Bacterial Conjunctivitis /periorbital cellulitis -Resolved  Chronic pansinusitis/recurrent fevers 2/2 aspiration PNA;History of Serratia Pneumonia  Hospital Acquired Pneumonia:  -Completed several courses of antibiotic -Follow-up imaging including sinus CT consistent with chronic but stable and resolving sinusitis   Anemia -Stable  HLD -Continue Lipitor      Data Reviewed: Basic Metabolic Panel: No results for input(s): NA, K, CL, CO2, GLUCOSE, BUN, CREATININE, CALCIUM, MG, PHOS in the last 168  hours.  Liver Function Tests: No results for input(s): AST, ALT, ALKPHOS, BILITOT, PROT, ALBUMIN in the last 168 hours.  CBC: No results for input(s): WBC, NEUTROABS, HGB, HCT, MCV, PLT in the last 168 hours.   CBG: Recent Labs  Lab 01/11/21 0813 01/11/21 1349 01/11/21 1726 01/11/21 2014 01/12/21 0011  GLUCAP 134* 113* 131* 117* 137*     Studies: CT MAXILLOFACIAL WO CONTRAST  Result Date: 01/11/2021 CLINICAL DATA:  Dental pain EXAM: CT MAXILLOFACIAL WITHOUT CONTRAST TECHNIQUE: Multidetector CT imaging of the maxillofacial structures was performed. Multiplanar CT image reconstructions were also generated. COMPARISON:  None. FINDINGS: Osseous: No facial fracture. Dental: There is severe dental disease with periapical lucencies of both maxillary first molars. On the right, there is destruction of the overlying maxillary cortex. Additionally, there is a large area of periapical lucency within the unerupted right mandibular third molar. Lucency extends anteriorly to the expected location of the roots of the absent first and second molars. Orbits: The globes are intact. Normal appearance of the intra- and extraconal fat. Symmetric extraocular muscles. Sinuses: Mild left maxillary mucosal thickening. Soft tissues: Normal visualized extracranial soft tissues. Limited intracranial: Normal. IMPRESSION: 1. Severe dental disease with periapical lucencies of both maxillary first molars and the unerupted right mandibular third molar. 2. 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 3. No soft tissue abscess or fluid collection. Electronically Signed   By: Ulyses Jarred M.D.   On: 01/11/2021 22:45     Scheduled Meds:  amLODipine  10 mg Per Tube Daily   atorvastatin  40 mg Per Tube Daily   baclofen  10 mg Per Tube 3 times per day   chlorhexidine gluconate (MEDLINE KIT)  15 mL Mouth Rinse BID   Chlorhexidine Gluconate Cloth  6 each Topical Daily   diclofenac Sodium  4 g Topical QID    doxazosin  2 mg Per Tube Daily   feeding supplement (PROSource TF)  45 mL Per Tube Daily   fiber  1 packet Per Tube BID   free water  200 mL Per Tube Q4H   hydrALAZINE  50 mg Per Tube Q8H   ibuprofen  400 mg Oral Q8H   insulin aspart  0-9 Units Subcutaneous Q4H   mouth rinse  15 mL Mouth Rinse 5 X Daily   metoprolol tartrate  100 mg Per Tube BID   modafinil  200 mg Per Tube Daily   tiZANidine  4 mg Per Tube QHS   traMADol  25 mg Per Tube Q6H   Continuous Infusions:  sodium chloride Stopped (10/26/20 1445)   feeding supplement (OSMOLITE 1.2 CAL) 1,000 mL (01/11/21 0707)    Principal Problem:   ICH (intracerebral hemorrhage) ([place]) Active Problems:   Acute hypoxemic respiratory failure (HCC)   Tracheostomy dependent ([place])   Palliative care by specialist   Muscle hypertonicity   Oropharyngeal dysphagia   Poor prognosis  Aspiration pneumonia of right lower lobe (HCC)   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   Acute cystitis without hematuria   Ileus, unspecified ([place])   Constipation   Consultants: Neurology  Neurosurgery.  Palliative care.   PCCM continues to follow.  No decannulation.   Procedures: Tracheostomy PEG tube EEG Echocardiogram  Antibiotics: Vancomycin 2/12-2/15 Maxipime 2/12-2/15 Ancef 2/19-2/27 Vancomycin 2/20 x 1 dose Maxipime 2/27- 3/5 Vancomycin 3/1-3/3 Ancef 3/6-3/14 Cipro 3/20 4-3/30 Unasyn 3/30-4/4 Rocephin 4/11-4/12 Meropenem 4/12-4/17   Time spent: 25 minutes    Erin Hearing ANP  [facility] 7 am - 330 pm/M-F for direct patient care and secure chat Please refer to [place] for contact info 138  Days

## 2021-01-13 ENCOUNTER — Inpatient Hospital Stay (HOSPITAL_COMMUNITY): Payer: Medicaid Other

## 2021-01-13 DIAGNOSIS — G8929 Other chronic pain: Secondary | ICD-10-CM

## 2021-01-13 DIAGNOSIS — M25561 Pain in right knee: Secondary | ICD-10-CM

## 2021-01-13 LAB — GLUCOSE, CAPILLARY
Glucose-Capillary: 114 mg/dL — ABNORMAL HIGH (ref 70–99)
Glucose-Capillary: 115 mg/dL — ABNORMAL HIGH (ref 70–99)
Glucose-Capillary: 121 mg/dL — ABNORMAL HIGH (ref 70–99)
Glucose-Capillary: 128 mg/dL — ABNORMAL HIGH (ref 70–99)
Glucose-Capillary: 139 mg/dL — ABNORMAL HIGH (ref 70–99)
Glucose-Capillary: 143 mg/dL — ABNORMAL HIGH (ref 70–99)
Glucose-Capillary: 184 mg/dL — ABNORMAL HIGH (ref 70–99)

## 2021-01-13 IMAGING — DX DG KNEE 1-2V*R*
2 series · 2 of 2 positions shown · non-contrast
Comparison: None.

CLINICAL DATA: Flexed right knee, pain

EXAM:
RIGHT KNEE - 1-2 VIEW

[x knee lat right]
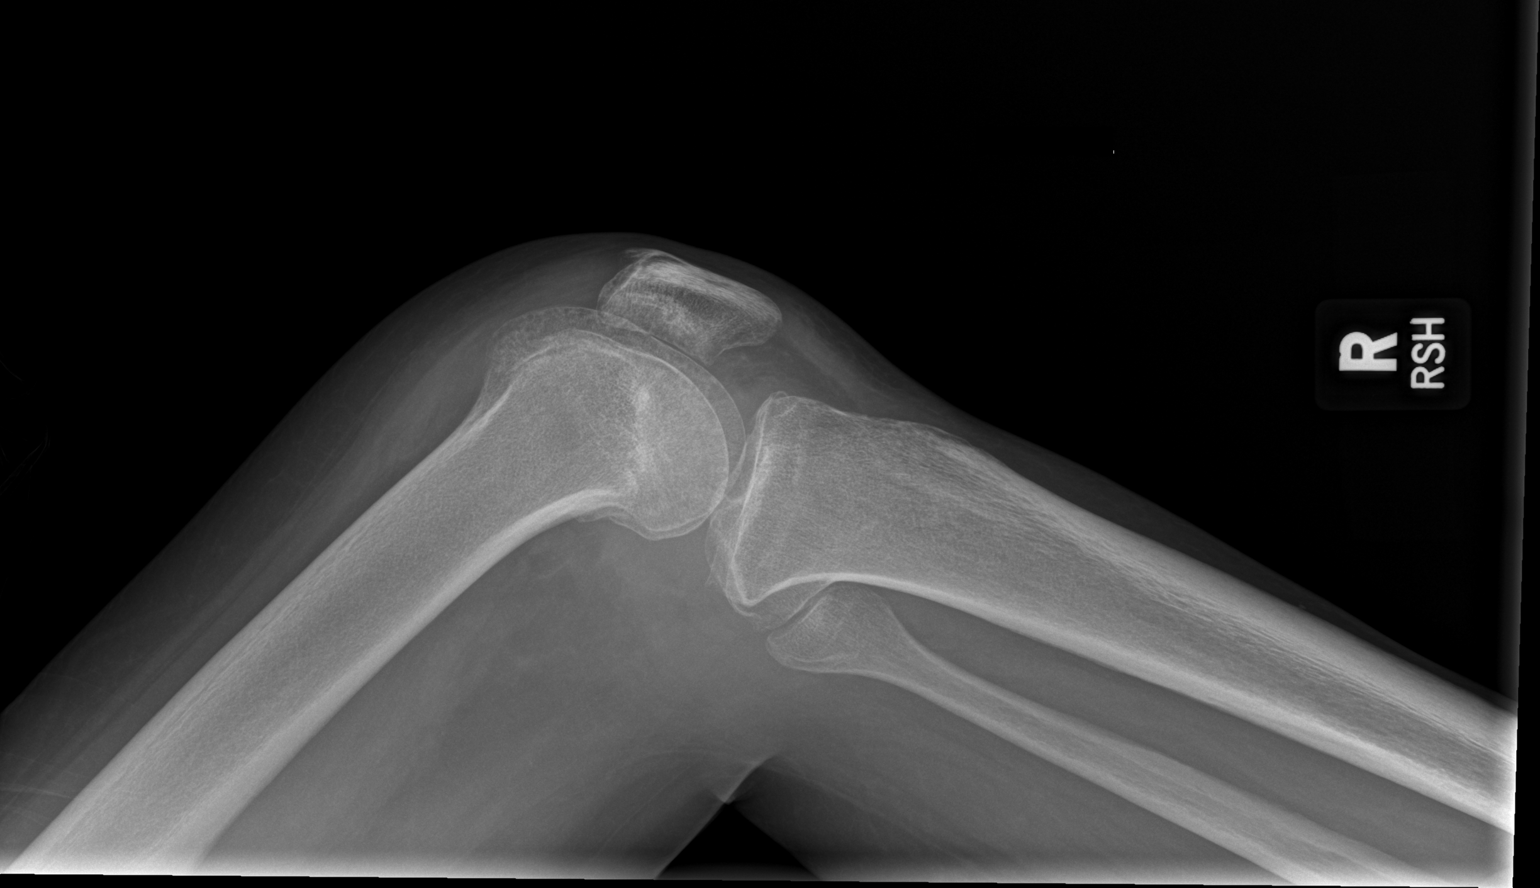

[x knee ap right]
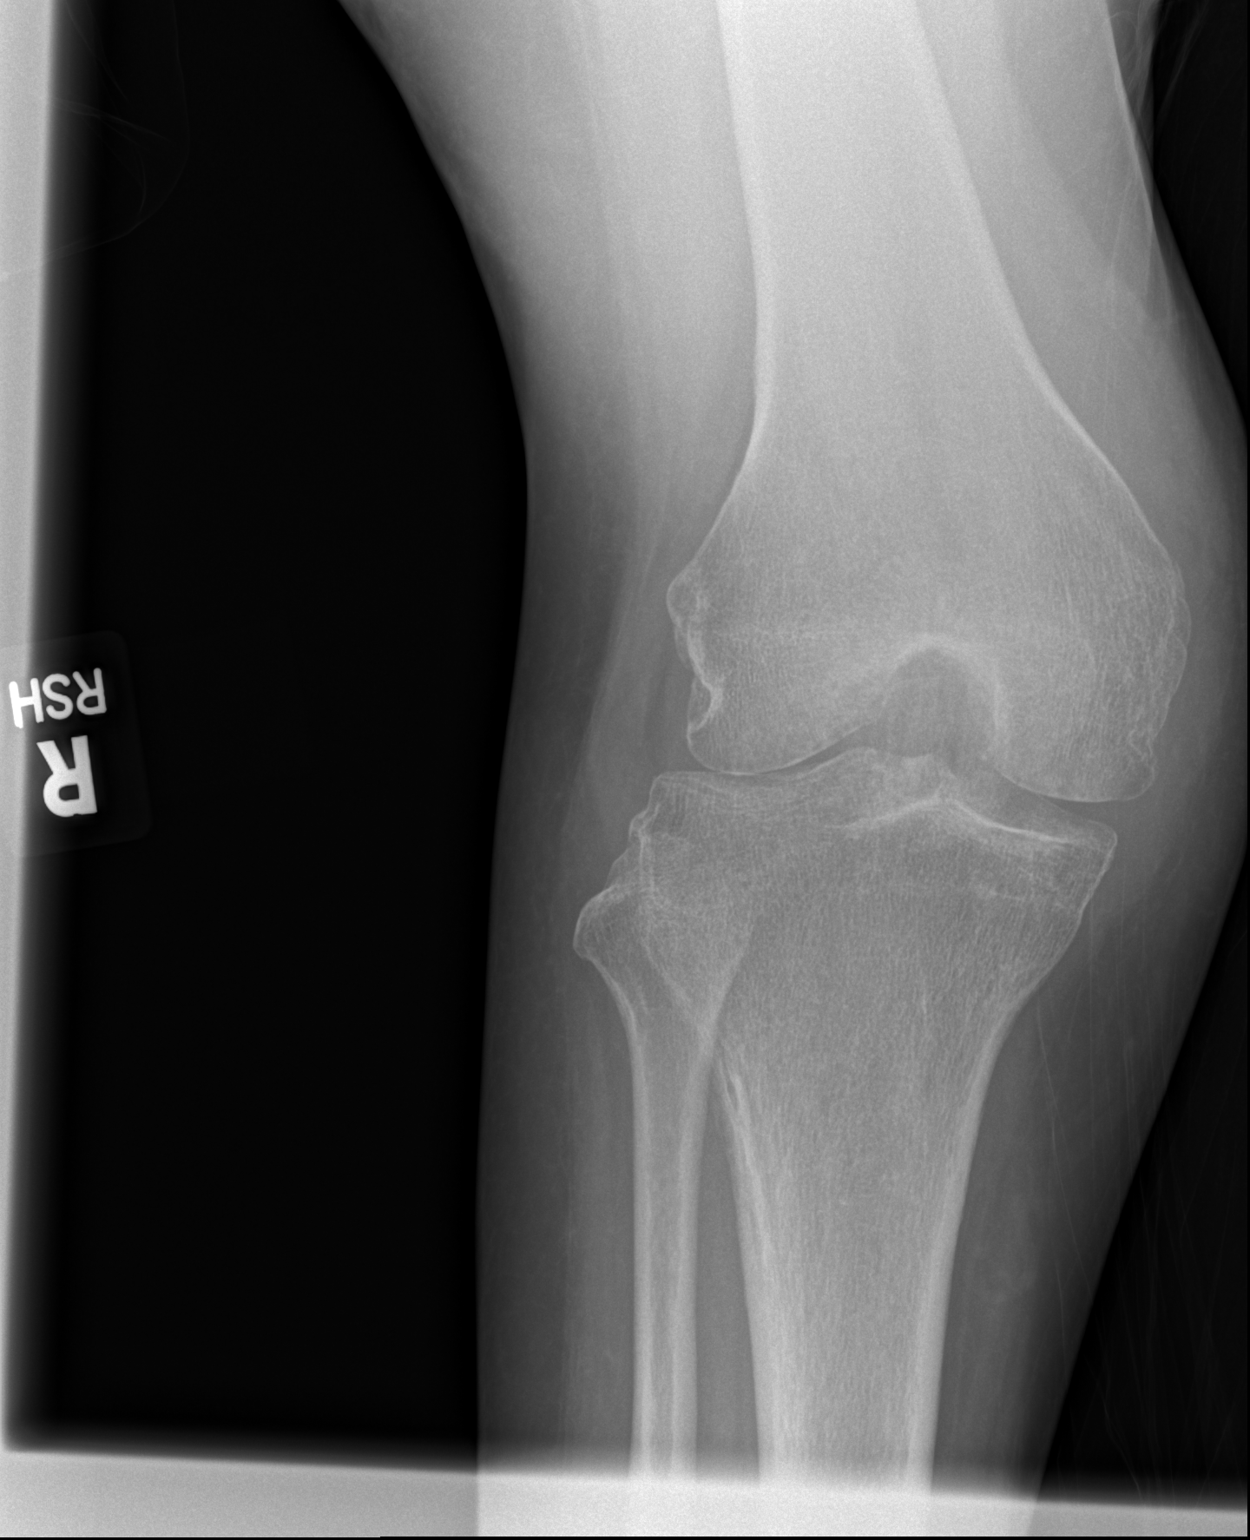

[2 of 2 positions shown; findings below may reference images not displayed]

FINDINGS: Limited two-view exam. Flexed position. No malalignment or fracture.
Negative for effusion. No significant arthropathy.
IMPRESSION: No acute finding.

## 2021-01-13 MED ORDER — BUPIVACAINE HCL (PF) 0.5 % IJ SOLN
10.0000 mL | Freq: Once | INTRAMUSCULAR | Status: DC
  Filled 2021-01-13 (×2): qty 10

## 2021-01-13 MED ORDER — METHYLPREDNISOLONE ACETATE 40 MG/ML IJ SUSP
40.0000 mg | Freq: Once | INTRAMUSCULAR | Status: DC
  Filled 2021-01-13 (×2): qty 1

## 2021-01-13 NOTE — Progress Notes (Signed)
[facility] PROGRESS NOTE  Charlene [name] BCW:[id number] DOB: February 23, 1966 DOA: 08/26/2020 PCP: Patient, [name] (Inactive)    October 08, 2020     10/16/2020                        10/16/2020     10/18/2020                        10/20/2020      12/30/2020                   12/30/2020   01/04/2021   Status: Remains inpatient appropriate because:Altered mental status and Unsafe d/c plan: Requirement to discharge to trach capable SNF   Dispo: The patient is from: Home              Anticipated d/c is to: SNF trach capable-[place]-we have recently discovered that the [facility] may have a trach bed available in clinicals have been sent to the facility for review -trach team re eval 6/3 and still rec NOT to decannulate 2/2 and lack of appropriate voluntary and involuntary cough response              Patient currently is not medically stable to d/c.   Difficult to place patient Yes   Level of care: Telemetry Medical  Code Status: Full Family Communication: [name] [phone]) is her POA and legal contact.  [name] was at bedside on 5/18 and given update.  [name] at bedside 6/6; mother at bedside multiple times this week with the most recent update on 6/17 DVT prophylaxis: Chronically bedbound.  5/6: We will discontinue prophylactic Lovenox   Vaccination status: Unvaccinated-husband still undecided.  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  Subsequently on 2/10, she developed obtundation and respiratory arrest leading to intubation.  She has remained persistently obtunded, now has a trach and a PEG.  Hospital course complicated by persistent fever, Serratia pneumonia and now pre-orbital cellulitis  As of 3/25 with adjustments in hypertonicity medications patient began to improve regarding hypertonicity.  Eventually started on provigil with improvements in alertness.  Evaluated by rehab physician who recommended increasing provigil dose further.  Subsequently patient has become more alert.  Most days she is able to track and with encouragement able to follow simple commands especially since partial obstruction glasses placed.  See progress note dictated 4/27 for expanded details.   2/09 Admit for [place] 2/10 EVD placed, later intubated due to obtundation and respiratory arrest 2/11 MRI Brain, hypertonic saline d/c'd, EVD not working.  Husband did not consent to PICC  2/13 EVD removed 2/17 [place] discussion with family, PMT 2/24 Bedside trach and PEG 3/1 Transferred to [facility]  Subjective: Alert.  Laying supine flat on back in bed while occupational therapy works with her.  Noted to have increasing pain in her right knee with inability to fully extend.  No other issues detected.  Family at bedside.  Objective: Vitals:   01/13/21 0434 01/13/21 0737  BP:  (!) 141/85  Pulse:  95  Resp:  20  Temp: 98.6 F (37 C) 98.8 F (37.1 C)  SpO2:     No intake or output data in the 24 hours ending 01/13/21 0831   Filed Weights   01/08/21 0500 01/10/21 0500 01/11/21 0500  Weight: 56 kg 56 kg 57.4 kg    Exam:       Gen: Alert and appears to be uncomfortable secondary to recurrent right knee pain Respiratory: #4.0 cuffless trach, trach collar with FiO2 21% at 5L, lungs remain clear, no increased work of breathing, no spontaneous coughing. Cardiovascular: S1-S2, regular pulse, mildly tachycardic in context of  pain.  Stable nonpitting pedal edema. Abdomen:  PEG for tube feeding-LBM 6/23.  Musculoskeletal: Bilateral atrophy of thigh and calf muscles --increased pain right knee with inability to fully extend without significant discomfort.  He is tender to palpation without erythema or effusion. Neurologic: Left facial droop, right upper extremity strength 4/5 with ability to point.  Left upper extremity strength 1/5.  Right lower extremity strength difficult to test given patient's propensity to keep knees flexed secondary to knee pain.  Left lower extremity strength about 1-2/5 Psychiatric: Alert and grimacing with pain.  Attempting to phonate but none in intelligible.   Assessment/Plan: Acute problems: Multiple acute strokes  Right Thalamus ICH with IVH  Obstructive Hydrocephalus/Acute on persistent metabolic Encephalopathy/Non TBI related hypertonicity -Continue baclofen and tizanidine for spasticity. Continue Provigil and therapies -Reevaluated by CIR (5/2 ) with recommendation for SNF -Her brain injury involves corona radiata, midbrain with associated midline shift and right basal ganglia.  These areas correlate with awareness and purposeful movement -Continues to work well and consistently with therapies.    Tracheostomy dependent -Inconsistent provoked cough effort and when she does cough very weak and basically ineffective -6/16 [name] d/w her husband..She documented that recovery would be very slow and at best she is weeks to months away from safe decannulation. Her very slow recovery trajectory is concerning for requirement for permanent trach/no decannulation. 6/20 reevaluated by trach team -not yet a candidate for capping  Colonic Ileus/recurrent constipation/loose stools -Given one-time dose of Chronulac on 6/27 without results. -If no BM by 6/29 will give 3 days of Chronulac and monitor for results  Acute dental pain -Mother states patient still has 2 impacted wisdom teeth -On  exam no significant findings during inspection although patient did grimace during palpation of both posterior aspects of upper and lower jaw -Also has history of recurrent sinusitis therefore will obtain limited CT maxillofacial to better evaluate -Orajel QID symptom management  Recurrent right knee pain/known ligamentous injury and chronic osteoarthritis -Continues to have right knee pain and is now noted to have inability to extend right leg fully. -Orthopedic team consulted for possible injection to relieve pain -Continue scheduled ibuprofen x5 days -Add Oxy elixir 2.5 mg prn for more severe pain -Recent CT imaging without acute findings and current exam without evidence of effusion  Hypertensive emergency/Malignant HTN -Continue Norvasc, Lopressor and hydralazine.  Dysphagia -Continue tube feedings, Prosource liquid, free water -Continue supervised pured diet allowing family to administer orals after adequate training  History of urinary retention -Continue Cardura      Other problems: Right upper extremity thrombophlebitis - [name] -> age indeterminate SVT of right cephalic vein  Neck Contracture/torticollis -- Resolved  ESBL positive Enterobacter UTI -Has completed 5 days  of meropenem -Contact isolation during hospitalization -continue Oxybutynin suspected bladder spasms  Klebsiella UTI -Resistant to nitrofurantoin and ampicillin -Meropenem transitioned to Rocephin on 5/22 -To minimize risk of recurrent UTI have asked that pure wick only be used at bedtime on a 10 PM to 8 AM schedule  Recurrent diarrhea -Resolved -Likely combination of recent antibiotics as well as scheduled laxative -MiraLAX now as needed  Bacterial Conjunctivitis /periorbital cellulitis -Resolved  Chronic pansinusitis/recurrent fevers 2/2 aspiration PNA;History of Serratia Pneumonia  Hospital Acquired Pneumonia:  -Completed several courses of antibiotic -Follow-up imaging including sinus CT  consistent with chronic but stable and resolving sinusitis   Anemia -Stable  HLD -Continue Lipitor      Data Reviewed: Basic Metabolic Panel: No results for input(s): NA, K, CL, CO2, GLUCOSE, BUN, CREATININE, CALCIUM, MG, PHOS in the last 168 hours.  Liver Function Tests: No results for input(s): AST, ALT, ALKPHOS, BILITOT, PROT, ALBUMIN in the last 168 hours.  CBC: No results for input(s): WBC, NEUTROABS, HGB, HCT, MCV, PLT in the last 168 hours.   CBG: Recent Labs  Lab 01/12/21 0849 01/12/21 1243 01/12/21 1636 01/12/21 1956 01/13/21 0021  GLUCAP 128* 129* 134* 99 128*     Studies: CT MAXILLOFACIAL WO CONTRAST  Result Date: 01/11/2021 CLINICAL DATA:  Dental pain EXAM: CT MAXILLOFACIAL WITHOUT CONTRAST TECHNIQUE: Multidetector CT imaging of the maxillofacial structures was performed. Multiplanar CT image reconstructions were also generated. COMPARISON:  None. FINDINGS: Osseous: No facial fracture. Dental: There is severe dental disease with periapical lucencies of both maxillary first molars. On the right, there is destruction of the overlying maxillary cortex. Additionally, there is a large area of periapical lucency within the unerupted right mandibular third molar. Lucency extends anteriorly to the expected location of the roots of the absent first and second molars. Orbits: The globes are intact. Normal appearance of the intra- and extraconal fat. Symmetric extraocular muscles. Sinuses: Mild left maxillary mucosal thickening. Soft tissues: Normal visualized extracranial soft tissues. Limited intracranial: Normal. IMPRESSION: 1. Severe dental disease with periapical lucencies of both maxillary first molars and the unerupted right mandibular third molar. 2. 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 3. No soft tissue abscess or fluid collection. Electronically Signed   By: Ulyses Jarred M.D.   On: 01/11/2021 22:45     Scheduled Meds:  amLODipine  10 mg  Per Tube Daily   atorvastatin  40 mg Per Tube Daily   baclofen  10 mg Per Tube 3 times per day   chlorhexidine gluconate (MEDLINE KIT)  15 mL Mouth Rinse BID   Chlorhexidine Gluconate Cloth  6 each Topical Daily   diclofenac Sodium  4 g Topical QID   doxazosin  2 mg Per Tube Daily   feeding supplement (PROSource TF)  45 mL Per Tube Daily   fiber  1 packet Per Tube BID   free water  200 mL Per Tube Q4H   hydrALAZINE  50 mg Per Tube Q8H   ibuprofen  400 mg Oral Q8H   insulin aspart  0-9 Units Subcutaneous Q4H   mouth rinse  15 mL Mouth Rinse 5 X Daily   metoprolol tartrate  100 mg Per Tube BID   modafinil  200 mg Per Tube Daily   tiZANidine  4 mg Per Tube QHS   traMADol  25 mg Per Tube Q6H   Continuous Infusions:  sodium chloride Stopped (10/26/20 1445)   feeding supplement (OSMOLITE 1.2 CAL) 1,000 mL (01/11/21 0707)  Principal Problem:   ICH (intracerebral hemorrhage) (HCC) Active Problems:   Acute hypoxemic respiratory failure (HCC)   Tracheostomy dependent (HCC)   Palliative care by specialist   Muscle hypertonicity   Oropharyngeal dysphagia   Poor prognosis   Aspiration pneumonia of right lower lobe (HCC)   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  Palliative care.   PCCM continues to follow.  No decannulation.   Procedures: Tracheostomy PEG tube EEG Echocardiogram  Antibiotics: Vancomycin 2/12-2/15 Maxipime 2/12-2/15 Ancef 2/19-2/27 Vancomycin 2/20 x 1 dose Maxipime 2/27- 3/5 Vancomycin 3/1-3/3 Ancef 3/6-3/14 Cipro 3/20 4-3/30 Unasyn 3/30-4/4 Rocephin 4/11-4/12 Meropenem 4/12-4/17   Time spent: 25 minutes    Erin Hearing ANP  [facility] 7 am - 330 pm/M-F for direct patient care and secure chat Please refer to [place] for contact info [phone]  Days

## 2021-01-13 NOTE — Progress Notes (Signed)
Physical Therapy Treatment Patient Details Name: Charlene [name] MRN: [id number] DOB: [birth year] Today's Date: 01/13/2021    History of Present Illness Pt is 55 y/o female [facility] to [facility] on [facility]20 with L sided weakness and headaches. 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 s/p Right frontal ventricular catheter placement on 2/10, stopped functioning on 2/11.  MRI 2/11 shows right greater than left mesial temporal lobes / parahippocampal cortex, bilateral basal ganglia, and possibly the upper pons that are concerning for acute infarcts, most likely secondary to mass effect from the hemorrhage. ETT 2/10-2/24.  Pt with trach and PEG on 09/10/20. Hospital course complicated by persistent fever, Serratia pneumonia and now pre-orbital cellulitis.   PMH includes anxiety, HTN.    PT Comments    Pt was able to replicate basic commands for me today of "wave, thumbs up, show me two fingers" without visual cues (so we know she was processing the verbal command).  She also preformed more familiar item manipulation with lotion, wash cloth, and [place], having difficulty releasing the items.  [name] remains significantly stiff in her trunk, head, neck and R knee seems more stiff today as I was unable to get her knee extended as much as I have in past sessions.  NP, Junious Silk made aware of the increased difficulty.  OT asked to try to do tilt bed tomorrow as we continue to vary her stimuli from session to session (EOB, OOB to WC, and tilt bed).  Pt is making slow, yet steady progress.  PT will continue to follow acutely for safe mobility progression    Follow Up Recommendations  SNF     Equipment Recommendations  Wheelchair (measurements PT);Wheelchair cushion (measurements PT);Hospital bed;Other (comment) (18x18 tilt in space, hoyer lift)    Recommendations for  Other Services       Precautions / Restrictions Precautions Precautions: Fall Precaution Comments: peg and trach Required Braces or Orthoses: Other Brace Other Brace: RLE soft knee brace and LUE soft elbow brace (wearing schedule in RN orders); bil PRAFOs    Mobility  Bed Mobility Overal bed mobility: Needs Assistance Bed Mobility: Rolling;Sidelying to Sit Rolling: Total assist;+2 for physical assistance Sidelying to sit: Total assist;+2 for physical assistance     Sit to sidelying: +2 for physical assistance;Total assist General bed mobility comments: Although pt would follow some hand over hand to reach for rail or reach for bed, her assist level continues to be <10%    Transfers                    Ambulation/Gait                 Stairs             Wheelchair Mobility    Modified Rankin (Stroke Patients Only)       Balance Overall balance assessment: Needs assistance Sitting-balance support: Feet unsupported;Single extremity supported Sitting balance-Leahy Scale: Poor Sitting balance - Comments: Mostly mod assist EOB to support trunk.  Pt uses extension tone in R arm to prop in sitting, but when arm released, needs more support at trunk.  Worked in sitting EOB >15 mins on familiar object manipulation, command following, trunk rotation, head and neck rotation.  Cognition Arousal/Alertness: Awake/alert Behavior During Therapy: Flat affect Overall Cognitive Status: Impaired/Different from baseline Area of Impairment: Attention;Following commands;Problem solving                   Current Attention Level: Sustained (breif)   Following Commands: Follows one step commands inconsistently;Follows one step commands with increased time     Problem Solving: Slow processing;Decreased initiation;Requires verbal cues;Requires tactile cues (multimodal cues) General Comments: Pt able to follow basic  commands today including wave, thumbs up, 2 fingers without visual cues (visual cues last time I was successful in getting her to do these things today only verbal cues).  She does do better if extension tone is decreased in R UE so that she may be free to do some of those motions, and remains inconsistant with command following (especially to release or hand back items) needing extra processing time and often loses attention and needs repetition of command.  She did more familiar item manipulation today as well (lotion, [place]).      Exercises      General Comments General comments (skin integrity, edema, etc.): Returned to supine to work on PROM of neck cervical rotation bil and cervical side bend bil, removed pillow so that pt could relax into cervical extension.  PROM to bil knee extension (much more difficult today on R knee, possible volitional pt guarding--still reaches to knee as if in pain with PROM to R knee), L elbow extension, application of R knee soft extension brace, left elbow soft extension brace and bil PRAFOs.  Pt positioned in high chair mode at end of session with family in room to supervise.  VSS throughout on RA, however, TC re-applied for humidification at end of session and tube feed re-started.      Pertinent Vitals/Pain Pain Assessment: Faces Faces Pain Scale: Hurts even more Pain Location: general mobility and ROM to neck and R knee Pain Descriptors / Indicators: Grimacing;Guarding Pain Intervention(s): Monitored during session;Limited activity within patient's tolerance;Repositioned;Other (comment) (RN notified that pain meds and voltaren gel may be needed post therapy)    Home Living                      Prior Function            PT Goals (current goals can now be found in the care plan section) Progress towards PT goals: Progressing toward goals    Frequency    Min 2X/week      PT Plan Current plan remains appropriate    Co-evaluation               AM-PAC PT "6 Clicks" Mobility   Outcome Measure  Help needed turning from your back to your side while in a flat bed without using bedrails?: Total Help needed moving from lying on your back to sitting on the side of a flat bed without using bedrails?: Total Help needed moving to and from a bed to a chair (including a wheelchair)?: Total Help needed standing up from a chair using your arms (e.g., wheelchair or bedside chair)?: Total Help needed to walk in hospital room?: Total Help needed climbing 3-5 steps with a railing? : Total 6 Click Score: 6    End of Session   Activity Tolerance: Patient limited by pain Patient left: in bed;with call bell/phone within reach;with family/visitor present Nurse Communication: Patient requests pain meds;Other (comment) (need for pt meds, pt unable to request) PT Visit Diagnosis: Hemiplegia and hemiparesis;Other  symptoms and signs involving the nervous system (R29.898);Other abnormalities of gait and mobility (R26.89) Hemiplegia - Right/Left: Right (R LE and left UE/LE) Hemiplegia - dominant/non-dominant: Dominant Hemiplegia - caused by: Cerebral infarction;Nontraumatic intracerebral hemorrhage;Other Nontraumatic intracranial hemorrhage     Time: 9622-2979 PT Time Calculation (min) (ACUTE ONLY): 50 min  Charges:  $Therapeutic Activity: 38-52 mins                     Corinna Capra, PT, DPT  Acute Rehabilitation Ortho Tech Supervisor [phone] pager [phone]) [phone] office

## 2021-01-13 NOTE — Progress Notes (Signed)
Nutrition Follow-up  DOCUMENTATION CODES:  Not applicable  INTERVENTION:  Continue Tube feeding via PEG tube: -Osmolite 1.2 at 60 ml/h (1440 ml per day) -45 ml ProSource TF daily   Provides 1768 kcal, 90 gm protein, 1180 ml free water daily   -200 ml free water every 4 hours    NUTRITION DIAGNOSIS:  Inadequate oral intake related to inability to eat as evidenced by NPO status. 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  Subsequently on 2/10, pt developed obtundation and respiratory arrest leading to intubation. Pt is now s/p trach/PEG placement. Hospital course complicated by Serratia pneumonia, ESBL Enterobacter UTI and pre-orbital cellulitis.   2/10 s/p EVD placement and intubation for respiratory arrest 2/13 EVD removed 2/17 palliative care consult; family requesting tx to [facility]  2/24 s/p trach and PEG  3/1 tx to [facility] 3/11 trach changed to [place] #6 cuffless 4/08 pt w/ sepsis 2/2 UTI 4/18 28% ATC/5L 5/2 CIR reevaluated pt and determined she has not made enough improvement for admit; recommended SNF 5/8 trach changed to [place] #4 cuffless  5/23 CCM evaluated and determined pt stilll unable to decannulate due to variable mental status 6/3 trach exchanged, still [place] #4 cuffless  Pt sleeping soundly at time of RD visit. Pt remains NPO. Per RN, pt continues to tolerate TF regimen via PEG: Osmolite 1.2 at 60 ml/h w/ 45 ml ProSource TF daily, 224m free water flushes Q4H.   UOP: 4 unmeasured occurrences x24 hours Stool: 1x unmeasured occurrence x24 hours  Pt's weight has been stable with current TF  regimen. Pt with non-pitting edema to BLE and LUE per RN assessment.   Medications: Scheduled Meds:  amLODipine  10 mg Per Tube Daily   atorvastatin  40 mg Per Tube Daily   baclofen  10 mg Per Tube 3 times per day   bupivacaine  10 mL Infiltration Once   chlorhexidine gluconate (MEDLINE KIT)  15 mL Mouth Rinse BID   Chlorhexidine Gluconate Cloth  6 each Topical Daily   diclofenac Sodium  4 g Topical QID   doxazosin  2 mg Per Tube Daily   feeding supplement (PROSource TF)  45 mL Per Tube Daily   fiber  1 packet Per Tube BID   free water  200 mL Per Tube Q4H   hydrALAZINE  50 mg Per Tube Q8H   ibuprofen  400 mg Oral Q8H   insulin aspart  0-9 Units Subcutaneous Q4H   mouth rinse  15 mL Mouth Rinse 5 X Daily   methylPREDNISolone acetate  40 mg Intra-articular Once   metoprolol tartrate  100 mg Per Tube BID   modafinil  200 mg Per Tube Daily   tiZANidine  4 mg Per Tube QHS   traMADol  25 mg Per Tube Q6H  Continuous Infusions:  sodium chloride Stopped (10/26/20 1445)   feeding supplement (OSMOLITE 1.2 CAL) 1,000 mL (01/13/21 1347)   Labs reviewed.  CBGs [phone] Diet Order:   Diet Order             Diet NPO time specified  Diet effective now  EDUCATION NEEDS:  No education needs have been identified at this time  Skin:  Skin Assessment: Reviewed RN Assessment (MASD bilateral groin)  Last BM:  6/28  Height:  Ht Readings from Last 1 Encounters:  10/18/20 '5\' 5"'  (1.651 m)   Weight:  Wt Readings from Last 1 Encounters:  01/11/21 57.4 kg   Ideal Body Weight:  56.8 kg  BMI:  Body mass index is 21.06 kg/m.  Estimated Nutritional Needs:  Kcal:  1600-1800 Protein:  80-90 grams Fluid:  >1.6L/d    Larkin Ina, MS, RD, LDN RD pager number and weekend/on-call pager number located in [place].

## 2021-01-13 NOTE — Progress Notes (Signed)
CSW attempted to reach patient's Medicaid worker Patrice at [employer] without success - no voicemail option available.  CSW has attempted to reach [facility] supervisor to request worker return call - additional e-mail sent requesting information.  Edwin Dada, MSW, LCSW Transitions of Care  Clinical Social Worker II [phone]

## 2021-01-13 NOTE — Progress Notes (Signed)
No Charge Progress Note  Patient is followed by Ms. Junious Silk, NP on the long length of stay patient team.  I had taken care of this patient several weeks ago.  On my visit today, patient looks better.  She is awake, alert, tracking activity with her eyes but remains nonverbal.  However I am informed by Ms. Rennis Harding that she does intermittently say something.  She also has purposeful movement of right upper extremity and she is squeezing her right leg.  Otherwise she appears comfortable and in no obvious distress.  RT was in the room at the same time to manage her tracheostomy-she is on a cuffless size 4 tracheostomy along with trach collar.  Rest as per Ms. Ellis's progress note.  Note that she is being evaluated for right knee pain and orthopedics have been consulted for possible injection.  Marcellus Scott, MD, [place], [facility]. [facility]  To contact the attending provider between 7A-7P or the covering provider during after hours 7P-7A, please log into the web site [email or web address] and access using universal [place] password for that web site. If you do not have the password, please call the hospital operator.

## 2021-01-14 LAB — GLUCOSE, CAPILLARY
Glucose-Capillary: 101 mg/dL — ABNORMAL HIGH (ref 70–99)
Glucose-Capillary: 105 mg/dL — ABNORMAL HIGH (ref 70–99)
Glucose-Capillary: 121 mg/dL — ABNORMAL HIGH (ref 70–99)
Glucose-Capillary: 122 mg/dL — ABNORMAL HIGH (ref 70–99)
Glucose-Capillary: 124 mg/dL — ABNORMAL HIGH (ref 70–99)
Glucose-Capillary: 130 mg/dL — ABNORMAL HIGH (ref 70–99)
Glucose-Capillary: 137 mg/dL — ABNORMAL HIGH (ref 70–99)
Glucose-Capillary: 141 mg/dL — ABNORMAL HIGH (ref 70–99)
Glucose-Capillary: 167 mg/dL — ABNORMAL HIGH (ref 70–99)

## 2021-01-14 MED ORDER — OXYCODONE HCL 5 MG/5ML PO SOLN
5.0000 mg | ORAL | Status: DC | PRN
  Administered 2021-01-15 – 2021-01-17 (×3): 5 mg via ORAL
  Filled 2021-01-14 (×3): qty 5

## 2021-01-14 NOTE — Progress Notes (Signed)
  Speech Language Pathology Treatment: Dysphagia  Patient Details Name: MEYER DOCKERY MRN: [id number] DOB: Mar 18, 1966 Today's Date: 01/14/2021 Time: 6160-7371 SLP Time Calculation (min) (ACUTE ONLY): 13 min  Assessment / Plan / Recommendation Clinical Impression  SLP followed up for continued dysphagia intervention with cotreatment with OT for swallowing therapy and self feeding/cog therapy per OT. Pts mother at bedside. Repositioned pt fully upright. Pt with PMSV in place upon SLP arrival, tolerating well; continue to use during wake hours as tolerated. Will consider low level respiratory muscle strength training for SLP future sessions as pt mentation allows to improve candidacy for future decannulation. Pt assessed with puree, and honey thick liquids via cup this date. Pt able to self feed some with cueing per OT. Note anterior spillage during bolus prolpusion of purees; pt at times self attempting to wipe face. Palpable swallows exhibited and low vocal intensity humming had no wetness in voice. No overt s/sx of aspiration with any PO. SLP to continue to follow.    HPI HPI: Pt is 55 y/o female presents [facility] on 2/9 with L sided weakness and headaches. PMH includes anxiety, HTN. 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 S/p Right frontal ventricular catheter placement on 2/10, stopped functioning on 2/11.  MRI 2/11 shows right greater than left mesial temporal lobes / parahippocampal cortex, bilateral basal ganglia, and possibly the upper pons that are concerning for acute infarcts, most likely secondary to mass effect from the hemorrhage. ETT 2/10; trach/PEG 09/10/20.  Trach changed to #6 cuffless 3/11; #4 cuffless 5/4. Hospital course complicated by persistent fever,  Serratia pneumonia and pre-orbital cellulitis.      SLP Plan  Continue with current plan  of care       Recommendations  Diet recommendations: Other(comment) (PO puree trials with SLP and trained caregiver) Liquids provided via: Cup Medication Administration: Via alternative means Supervision: Trained caregiver to feed patient Compensations: Minimize environmental distractions Postural Changes and/or Swallow Maneuvers: Seated upright 90 degrees      Patient may use Passy-Muir Speech Valve: During all waking hours (remove during sleep) PMSV Supervision: Intermittent MD: Please consider changing trach tube to : Other (comment) (candidacy for red capping? per MD discretion)         Oral Care Recommendations: Oral care QID;Staff/trained caregiver to provide oral care Follow up Recommendations: Skilled Nursing facility SLP Visit Diagnosis: Dysphagia, oropharyngeal phase (R13.12) Plan: Continue with current plan of care       GO                Ardyth Gal MA, CCC-SLP Acute Rehabilitation Services   01/14/2021, 10:29 AM

## 2021-01-14 NOTE — Procedures (Signed)
Procedure: Right knee aspiration and injection   Indication: Right knee effusion(s)   Surgeon: Charma Igo, PA-C   Assist: None   Anesthesia: Topical refrigerant   EBL: None   Complications: Dry tap   Findings: After risks/benefits explained spouse desires to undergo procedure. Consent obtained and time out performed. The right knee was sterilely prepped and aspirated. No fluid obtained. Location confirmed and 30ml 0.5% Marcaine and 40mg  depomedrol instilled. Pt tolerated the procedure well.       , PA-C Orthopedic Surgery [phone]

## 2021-01-14 NOTE — Progress Notes (Signed)
Occupational Therapy Treatment Patient Details Name: Charlene [name] MRN: [id number] DOB: 1965-10-20 Today's Date: 01/14/2021    History of present illness Pt is 55 y/o female presents to [facility] on 08/26/20 with L sided weakness and headaches. 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 s/p Right frontal ventricular catheter placement on 2/10, stopped functioning on 2/11.  MRI 2/11 shows right greater than left mesial temporal lobes / parahippocampal cortex, bilateral basal ganglia, and possibly the upper pons that are concerning for acute infarcts, most likely secondary to mass effect from the hemorrhage. ETT 2/10-2/24.  Pt with trach and PEG on 09/10/20. Hospital course complicated by persistent fever, Serratia pneumonia and now pre-orbital cellulitis.   PMH includes anxiety, HTN.   OT comments  Pt progressing towards established OT goals. First part of session completed with SLP to focus on self feeding and swallowing. Pt demonstrating increased attention and engagement to participate in self feeding (yogurt). Pt using a built up handle on spoon to scoop with RUE and holding yogurt cup with L hand; Mod A for position on L hand. Noting decreased coordination of RUE and awareness as pt fatigued with task. Pt tolerating tilt bed and transitioning to 40* (tolerating semi standing for ~5 min); BP stable. Donning elbow splint at end of session. Continue to recommend dc to SNF and will continue to follow acutely as admitted.   Follow Up Recommendations  SNF;Supervision/Assistance - 24 hour    Equipment Recommendations  None recommended by OT    Recommendations for Other Services      Precautions / Restrictions Precautions Precautions: Fall Precaution Comments: peg and trach Required Braces or Orthoses: Other Brace Other Brace: RLE soft knee brace and LUE soft  elbow brace (wearing schedule in RN orders); bil PRAFOs       Mobility Bed Mobility Overal bed mobility: Needs Assistance             General bed mobility comments: Total A for repositioning in bed    Transfers                      Balance                                           ADL either performed or assessed with clinical judgement   ADL Overall ADL's : Needs assistance/impaired Eating/Feeding: Moderate assistance;Bed level (upright chair position) Eating/Feeding Details (indicate cue type and reason): Use of built up handle on spoon to optimize grasp. Providing support at L wrist to increased elbow extension. Pt holding yogart cup in L hand and scooping with spoon at R hand. As she fatigued, pt with decreased aim when bringing spoon to mouth. Grooming: Minimal assistance;Bed level Grooming Details (indicate cue type and reason): Pt wiping her mouth with wash clothe when spit food on chin; pt initially doing this as needed without cues or prompting ,but as she fatigued pt with less attention to mess                             Functional mobility during ADLs: Total assistance (use of tilt bed for semi standing) General ADL Comments: Focused session on engagementi n ADLS through self feeding and  then completing tilt bed for semi standing     Vision       Perception     Praxis      Cognition Arousal/Alertness: Awake/alert Behavior During Therapy: Flat affect Overall Cognitive Status: Difficult to assess Area of Impairment: Attention;Following commands;Problem solving                   Current Attention Level: Sustained Memory: Decreased short-term memory Following Commands: Follows one step commands inconsistently;Follows one step commands with increased time Safety/Judgement: Decreased awareness of deficits Awareness: Intellectual Problem Solving: Slow processing;Decreased initiation;Requires verbal cues;Requires  tactile cues          Exercises Exercises: Other exercises Other Exercises Other Exercises: PROM of LUE; placing in elbow extension soft splint Other Exercises: PROM of bilateral knees; stretch into extension   Shoulder Instructions       General Comments BP: Supine 124/85 (98); 15 degrees 124/83 (95); 30 degrees 128/81 (94); 40 degrees 113/75 9870; supine 127/80 (93)    Pertinent Vitals/ Pain       Pain Assessment: Faces Faces Pain Scale: Hurts little more Pain Location: B knees Pain Descriptors / Indicators: Grimacing;Guarding Pain Intervention(s): Monitored during session;Repositioned  Home Living                                          Prior Functioning/Environment              Frequency  Min 2X/week        Progress Toward Goals  OT Goals(current goals can now be found in the care plan section)  Progress towards OT goals: Progressing toward goals  Acute Rehab OT Goals Patient Stated Goal: Pt unable to state OT Goal Formulation: Patient unable to participate in goal setting Time For Goal Achievement: 01/11/21 Potential to Achieve Goals: Fair ADL Goals Pt/caregiver will Perform Home Exercise Program: With written HEP provided;Both right and left upper extremity;Increased ROM Additional ADL Goal #1: Pt will wash face with max A. Additional ADL Goal #2: Pt will feed self 6 bites of food with moderate hand over hand assist. Additional ADL Goal #3: Pt will demonstrate ability to give thumbs up with R hand with 75% accuracy given increased time (2-3 minutes). Additional ADL Goal #4: Pt will sit EOB with mod assist for 5 minutes statically.  Plan Frequency remains appropriate;Discharge plan remains appropriate    Co-evaluation    PT/OT/SLP Co-Evaluation/Treatment: Yes Reason for Co-Treatment: Other (comment) (Partiel co-x with SLP for eating/swallowing)     SLP goals addressed during session: Swallowing    AM-PAC OT "6 Clicks" Daily  Activity     Outcome Measure   Help from another person eating meals?: A Lot Help from another person taking care of personal grooming?: A Lot Help from another person toileting, which includes using toliet, bedpan, or urinal?: Total Help from another person bathing (including washing, rinsing, drying)?: Total Help from another person to put on and taking off regular upper body clothing?: Total Help from another person to put on and taking off regular lower body clothing?: Total 6 Click Score: 8    End of Session Equipment Utilized During Treatment: Oxygen  OT Visit Diagnosis: Muscle weakness (generalized) (M62.81);Apraxia (R48.2);Cognitive communication deficit (R41.841);Hemiplegia and hemiparesis Symptoms and signs involving cognitive functions: Other Nontraumatic ICH Hemiplegia - Right/Left: Right Hemiplegia - caused by: Other Nontraumatic intracranial hemorrhage   Activity Tolerance  Patient tolerated treatment well;Patient limited by pain   Patient Left in bed;with call bell/phone within reach;with family/visitor present   Nurse Communication Mobility status        Time: 1364-3837 OT Time Calculation (min): 62 min  Charges: OT General Charges $OT Visit: 1 Visit OT Treatments $Self Care/Home Management : 23-37 mins $Therapeutic Activity: 8-22 mins  Therman Hughlett MSOT, OTR/L Acute Rehab Pager: [phone] Office: [phone]   Theodoro Grist Dorr Perrot 01/14/2021, 12:13 PM

## 2021-01-14 NOTE — Consult Note (Signed)
Reason for Consult:Right knee pain Referring Physician: Marcellus Scott Time called: 1127 Time at bedside: 6/30@1400    Charlene [name] is an 55 y.o. female.  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 She is unable to communicate and history has been gleaned from the chart. She apparently has been cradling her right knee and grimacing for several days. Imaging showed some mild arthritis and questionable soft tissue abnormalities. Medical team wondering if steroid injection may be beneficial. I think unlikely radiographic abnormalities responsible for behavior but discussed with husband who was willing to try.  Past Medical History:  Diagnosis Date   Hypertension     Past Surgical History:  Procedure Laterality Date   CESAREAN SECTION     ESOPHAGOGASTRODUODENOSCOPY N/A 09/10/2020   Procedure: ESOPHAGOGASTRODUODENOSCOPY (EGD);  Surgeon: Kinsinger, De Blanch, MD;  Location: [facility] ENDOSCOPY;  Service: General;  Laterality: N/A;   PEG PLACEMENT N/A 09/10/2020   Procedure: PERCUTANEOUS ENDOSCOPIC GASTROSTOMY (PEG) PLACEMENT;  Surgeon: Sheliah Hatch De Blanch, MD;  Location: [facility] ENDOSCOPY;  Service: General;  Laterality: N/A;    Family History  Problem Relation Age of Onset   Hypertension Father    Cancer Maternal Grandmother    Thyroid disease Paternal Uncle     Social History:  reports that she has never smoked. She has never used smokeless tobacco. She reports that she does not drink alcohol and does not use drugs.  Allergies: No Known Allergies  Medications: I have reviewed the patient's current medications.  Results for orders placed or performed during the hospital encounter of 08/26/20 (from the past 48 hour(s))  Glucose, capillary     Status: Abnormal   Collection Time: 01/12/21  4:36 PM  Result Value Ref Range   Glucose-Capillary 134 (H) 70 - 99 mg/dL    Comment: Glucose reference range applies only to samples taken after fasting for at  least 8 hours.   Comment 1 Notify RN    Comment 2 Document in Chart   Glucose, capillary     Status: None   Collection Time: 01/12/21  7:56 PM  Result Value Ref Range   Glucose-Capillary 99 70 - 99 mg/dL    Comment: Glucose reference range applies only to samples taken after fasting for at least 8 hours.  Glucose, capillary     Status: Abnormal   Collection Time: 01/13/21 12:21 AM  Result Value Ref Range   Glucose-Capillary 128 (H) 70 - 99 mg/dL    Comment: Glucose reference range applies only to samples taken after fasting for at least 8 hours.  Glucose, capillary     Status: Abnormal   Collection Time: 01/13/21  3:58 AM  Result Value Ref Range   Glucose-Capillary 115 (H) 70 - 99 mg/dL    Comment: Glucose reference range applies only to samples taken after fasting for at least 8 hours.  Glucose, capillary     Status: Abnormal   Collection Time: 01/13/21  7:35 AM  Result Value Ref Range   Glucose-Capillary 143 (H) 70 - 99 mg/dL    Comment: Glucose reference range applies only to samples taken after fasting for at least 8 hours.  Glucose, capillary     Status: Abnormal   Collection Time: 01/13/21 10:37 AM  Result Value Ref Range   Glucose-Capillary 139 (H) 70 - 99 mg/dL    Comment: Glucose reference range applies only to samples taken after fasting for at least 8 hours.  Glucose, capillary     Status:  Abnormal   Collection Time: 01/13/21 11:49 AM  Result Value Ref Range   Glucose-Capillary 184 (H) 70 - 99 mg/dL    Comment: Glucose reference range applies only to samples taken after fasting for at least 8 hours.  Glucose, capillary     Status: Abnormal   Collection Time: 01/13/21  4:06 PM  Result Value Ref Range   Glucose-Capillary 121 (H) 70 - 99 mg/dL    Comment: Glucose reference range applies only to samples taken after fasting for at least 8 hours.  Glucose, capillary     Status: Abnormal   Collection Time: 01/13/21  8:13 PM  Result Value Ref Range   Glucose-Capillary 114  (H) 70 - 99 mg/dL    Comment: Glucose reference range applies only to samples taken after fasting for at least 8 hours.  Glucose, capillary     Status: Abnormal   Collection Time: 01/14/21 12:05 AM  Result Value Ref Range   Glucose-Capillary 137 (H) 70 - 99 mg/dL    Comment: Glucose reference range applies only to samples taken after fasting for at least 8 hours.  Glucose, capillary     Status: Abnormal   Collection Time: 01/14/21  4:43 AM  Result Value Ref Range   Glucose-Capillary 105 (H) 70 - 99 mg/dL    Comment: Glucose reference range applies only to samples taken after fasting for at least 8 hours.  Glucose, capillary     Status: Abnormal   Collection Time: 01/14/21  7:46 AM  Result Value Ref Range   Glucose-Capillary 130 (H) 70 - 99 mg/dL    Comment: Glucose reference range applies only to samples taken after fasting for at least 8 hours.  Glucose, capillary     Status: Abnormal   Collection Time: 01/14/21  9:09 AM  Result Value Ref Range   Glucose-Capillary 101 (H) 70 - 99 mg/dL    Comment: Glucose reference range applies only to samples taken after fasting for at least 8 hours.  Glucose, capillary     Status: Abnormal   Collection Time: 01/14/21 12:24 PM  Result Value Ref Range   Glucose-Capillary 124 (H) 70 - 99 mg/dL    Comment: Glucose reference range applies only to samples taken after fasting for at least 8 hours.    DG Knee 1-2 Views Right  Result Date: 01/13/2021 CLINICAL DATA:  Flexed right knee, pain EXAM: RIGHT KNEE - 1-2 VIEW COMPARISON:  None. FINDINGS: Limited two-view exam. Flexed position. No malalignment or fracture. Negative for effusion. No significant arthropathy. IMPRESSION: No acute finding. Electronically Signed   By: Judie Petit.  Shick M.D.   On: 01/13/2021 14:15    Review of Systems  Unable to perform ROS: Mental status change  Blood pressure 113/73, pulse 74, temperature 98.4 F (36.9 C), temperature source Oral, resp. rate 12, height 5\' 5"  (1.651 m),  weight 57.4 kg, last menstrual period 03/03/2014, SpO2 99 %, unknown if currently breastfeeding. Physical Exam Constitutional:      General: She is not in acute distress.    Appearance: She is well-developed. She is not diaphoretic.  HENT:     Head: Normocephalic and atraumatic.  Eyes:     General: No scleral icterus.       Right eye: No discharge.        Left eye: No discharge.     Conjunctiva/sclera: Conjunctivae normal.  Cardiovascular:     Rate and Rhythm: Normal rate and regular rhythm.  Pulmonary:     Effort: Pulmonary effort  is normal. No respiratory distress.  Musculoskeletal:     Cervical back: Normal range of motion.     Comments: RLE No traumatic wounds, ecchymosis, or rash  No obvious TTP, no grimace with PROM. Contracted at knee with maximal extension ~120  No knee or ankle effusion  Knee stable to varus/ valgus and anterior/posterior stress  Sens DPN, SPN, TN could not assess  Motor EHL, ext, flex, evers could not assess  No significant edema  Skin:    General: Skin is warm and dry.  Neurological:     Mental Status: She is alert.  Psychiatric:        Mood and Affect: Mood normal.        Behavior: Behavior normal.    Assessment/Plan: Right knee pain -- Will perform arthrocentesis and inject Marcaine and steroid. Given lack of apparent effusion may not get fluid for analysis, however this also probably rules out the important arthropathies such as sepsis and gout.    Freeman Caldron, PA-C Orthopedic Surgery [phone] 01/14/2021, 2:20 PM

## 2021-01-14 NOTE — Progress Notes (Signed)
No Charge Progress Note  I briefly visited patient.  Met with mother at bedside.  She concurs that patient has made some improvement over the last several weeks.  She indicated that patient recently had waived, give a thumbs up and even said thank you when she turned off the TV.  Orthopedic input appreciated.  S/p dry tap of right knee injected with Marcaine and steroids.  Rest as per Ms. Lissa Merlin, NP's note.  Vernell Leep, MD, [place], [facility]. [facility]  To contact the attending provider between 7A-7P or the covering provider during after hours 7P-7A, please log into the web site [email or web address] and access using universal [place] password for that web site. If you do not have the password, please call the hospital operator.

## 2021-01-14 NOTE — Progress Notes (Signed)
[facility] PROGRESS NOTE  Charlene [name] RJJ:[id number] DOB: [birth year] DOA: 08/26/2020 PCP: Patient, [name] (Inactive)    October 08, 2020     10/16/2020                        10/16/2020     10/18/2020                        10/20/2020      12/30/2020                   12/30/2020   01/04/2021   Status: Remains inpatient appropriate because:Altered mental status and Unsafe d/c plan: Requirement to discharge to trach capable SNF   Dispo: The patient is from: Home              Anticipated d/c is to: SNF trach capable[place]-we have recently discovered that the [facility] may have a trach bed available in clinicals have been sent to the facility for review -trach team re eval 6/3 and still rec NOT to decannulate 2/2 and lack of appropriate voluntary and involuntary cough response              Patient currently is not medically stable to d/c.   Difficult to place patient Yes  **Patient's husband Charlene [name] has received conflicting information about status of Medicaid application and need for additional documentation from the hospital.  [facility] has made several attempts to contact patient's Medicaid worker without success.  They will continue to try.  We also have a contact at [place] who will also investigate to determine exactly what information the Medicaid office needs so we can complete this patient's Medicaid application process with hopeful approval   Level of care: Telemetry Medical  Code Status: Full Family Communication: husband Charlene [name] [phone]) is her POA and legal contact.  Charlene [name] was at bedside on 5/18 and given update.  Charlene [name] at bedside 6/6; mother at bedside multiple times this week with the most recent update on 6/17 DVT prophylaxis: Chronically bedbound.  5/6: We will discontinue prophylactic Lovenox   Vaccination status: Unvaccinated-husband still undecided.  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  Subsequently on 2/10, she developed obtundation and respiratory arrest leading to intubation.  She has remained persistently obtunded, now has a trach and a PEG.  Hospital course complicated by persistent fever, Serratia pneumonia and now pre-orbital cellulitis  As of 3/25 with adjustments in hypertonicity medications patient began to improve regarding hypertonicity.  Eventually started on provigil with improvements in alertness.  Evaluated by rehab physician who recommended increasing provigil dose further.  Subsequently patient has become more alert.  Most days she is able to track and with encouragement able to follow simple commands especially since partial obstruction glasses placed.  See progress note dictated 4/27 for expanded details.   2/09 Admit for Tidioute 2/10 EVD placed, later intubated  due to obtundation and respiratory arrest 2/11 MRI Brain, hypertonic saline d/c'd, EVD not working.  Husband did not consent to PICC  2/13 EVD removed 2/17 [place] discussion with family, PMT 2/24 Bedside trach and PEG 3/1 Transferred to [facility]  Subjective: Awake.  Mother at bedside.  Patient waved to me when I entered the room with her right hand.  She then began to grimace and grabbed at her right knee to let me know her knee was still hurting.  Objective: Vitals:   01/14/21 0444 01/14/21 0748  BP: 140/78 131/89  Pulse: 89 93  Resp: 14 16  Temp: 99.1 F (37.3 C) 98.8 F (37.1 C)  SpO2: 99%    No  intake or output data in the 24 hours ending 01/14/21 0756   Filed Weights   01/08/21 0500 01/10/21 0500 01/11/21 0500  Weight: 56 kg 56 kg 57.4 kg    Exam:       Gen: Alert, uncomfortable secondary to ongoing right knee pain. Respiratory: #4.0 cuffless trach, trach collar with FiO2 21% at 5L, lungs remain clear to auscultation anteriorly.  Continues without purposeful cough. Cardiovascular: S1 S2, no peripheral edema, regular pulse and normotensive Abdomen:  PEG for tube feeding-LBM 6/23.  Soft and nontender with palpation. Musculoskeletal: Bilateral atrophy of thigh and calf muscles --increased pain right knee with inability to fully extend without significant discomfort.  He is tender to palpation without erythema or effusion. Neurologic: Left facial droop, right upper extremity strength 4/5 with ability to point.  Left upper extremity strength 1/5.  Right lower extremity strength difficult to test given patient's propensity to keep knees flexed secondary to knee pain.  Left lower extremity strength about 1-2/5 Psychiatric: Awake.  Intermittently able to phonate.  She is able to convey that she is likely somewhat oriented noting that she waves appropriately and smiles appropriately and attempts to engage verbally.  She is also able to localize pain appropriately.   Assessment/Plan: Acute problems: Multiple acute strokes  Right Thalamus ICH with IVH  Obstructive Hydrocephalus/Acute on persistent metabolic Encephalopathy/Non TBI related hypertonicity -Continue baclofen and tizanidine for spasticity. Continue Provigil and therapies -Reevaluated by CIR (5/2 ) with recommendation for SNF -Her brain injury involves corona radiata, midbrain with associated midline shift and right basal ganglia.  These areas correlate with awareness and purposeful movement -Continues to work well and consistently with therapies.    Tracheostomy dependent -Primary barrier to decannulation is poor to at times  absent cough reflex.  Inconsistent provoked cough effort and when she does cough very weak and basically ineffective -6/16 [name] d/w her husband..She documented that recovery would be very slow and at best she is weeks to months away from safe decannulation. Her very slow recovery trajectory is concerning for requirement for permanent trach/no decannulation. 6/20 reevaluated by trach team -not yet a candidate for capping 6/30 SLP considering low-level respiratory muscle strength training as patient mentation allows to improve candidacy for future decannulation.  Colonic Ileus/recurrent constipation/loose stools -Given one-time dose of Chronulac on 6/27 without results. -If no BM by 6/29 will give 3 days of Chronulac and monitor for results  Acute dental pain -CT maxillofacial without evidence of overt infection or abscess -Orajel QID symptom management  Recurrent right knee pain/known ligamentous injury and chronic osteoarthritis -Continues to have right knee pain and is now noted to have inability to extend right leg fully. -Orthopedic team consulted and plans right knee injection and they have discussed with patient's husband and mother. -Continue  scheduled ibuprofen x5 days -Increase Oxy elixir to 5 mg prn for more severe pain -Recent CT imaging without acute findings and current exam without evidence of effusion  Hypertensive emergency/Malignant HTN -Continue Norvasc, Lopressor and hydralazine.  Dysphagia -Continue tube feedings, Prosource liquid, free water -Continue supervised pured diet allowing family to administer orals after adequate training  History of urinary retention -Continue Cardura      Other problems: Right upper extremity thrombophlebitis - [name] -> age indeterminate SVT of right cephalic vein  Neck Contracture/torticollis -- Resolved  ESBL positive Enterobacter UTI -Has completed 5 days of meropenem -Contact isolation during hospitalization -continue  Oxybutynin suspected bladder spasms  Klebsiella UTI -Resistant to nitrofurantoin and ampicillin -Meropenem transitioned to Rocephin on 5/22 -To minimize risk of recurrent UTI have asked that pure wick only be used at bedtime on a 10 PM to 8 AM schedule  Recurrent diarrhea -Resolved -Likely combination of recent antibiotics as well as scheduled laxative -MiraLAX now as needed  Bacterial Conjunctivitis /periorbital cellulitis -Resolved  Chronic pansinusitis/recurrent fevers 2/2 aspiration PNA;History of Serratia Pneumonia  Hospital Acquired Pneumonia:  -Completed several courses of antibiotic -Follow-up imaging including sinus CT consistent with chronic but stable and resolving sinusitis   Anemia -Stable  HLD -Continue Lipitor      Data Reviewed: Basic Metabolic Panel: No results for input(s): NA, K, CL, CO2, GLUCOSE, BUN, CREATININE, CALCIUM, MG, PHOS in the last 168 hours.  Liver Function Tests: No results for input(s): AST, ALT, ALKPHOS, BILITOT, PROT, ALBUMIN in the last 168 hours.  CBC: No results for input(s): WBC, NEUTROABS, HGB, HCT, MCV, PLT in the last 168 hours.   CBG: Recent Labs  Lab 01/13/21 1037 01/13/21 1149 01/13/21 1606 01/13/21 2013 01/14/21 0005  GLUCAP 139* 184* 121* 114* 137*     Studies: DG Knee 1-2 Views Right  Result Date: 01/13/2021 CLINICAL DATA:  Flexed right knee, pain EXAM: RIGHT KNEE - 1-2 VIEW COMPARISON:  None. FINDINGS: Limited two-view exam. Flexed position. No malalignment or fracture. Negative for effusion. No significant arthropathy. IMPRESSION: No acute finding. Electronically Signed   By: Jerilynn Mages.  Shick M.D.   On: 01/13/2021 14:15     Scheduled Meds:  amLODipine  10 mg Per Tube Daily   atorvastatin  40 mg Per Tube Daily   baclofen  10 mg Per Tube 3 times per day   bupivacaine  10 mL Infiltration Once   chlorhexidine gluconate (MEDLINE KIT)  15 mL Mouth Rinse BID   Chlorhexidine Gluconate Cloth  6 each Topical Daily    diclofenac Sodium  4 g Topical QID   doxazosin  2 mg Per Tube Daily   feeding supplement (PROSource TF)  45 mL Per Tube Daily   fiber  1 packet Per Tube BID   free water  200 mL Per Tube Q4H   hydrALAZINE  50 mg Per Tube Q8H   ibuprofen  400 mg Oral Q8H   insulin aspart  0-9 Units Subcutaneous Q4H   mouth rinse  15 mL Mouth Rinse 5 X Daily   methylPREDNISolone acetate  40 mg Intra-articular Once   metoprolol tartrate  100 mg Per Tube BID   modafinil  200 mg Per Tube Daily   tiZANidine  4 mg Per Tube QHS   traMADol  25 mg Per Tube Q6H   Continuous Infusions:  sodium chloride Stopped (10/26/20 1445)   feeding supplement (OSMOLITE 1.2 CAL) 1,000 mL (01/14/21 0700)    Principal Problem:   ICH (intracerebral hemorrhage) (HCC) Active  Problems:   Acute hypoxemic respiratory failure (HCC)   Tracheostomy dependent (HCC)   Palliative care by specialist   Muscle hypertonicity   Oropharyngeal dysphagia   Poor prognosis   Aspiration pneumonia of right lower lobe (HCC)   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   Acute cystitis without hematuria   Ileus, unspecified ([place])   Constipation   Knee pain, right   Consultants: Neurology  Neurosurgery.  Palliative care.   PCCM continues to follow.  No decannulation.   Procedures: Tracheostomy PEG tube EEG Echocardiogram  Antibiotics: Vancomycin 2/12-2/15 Maxipime 2/12-2/15 Ancef 2/19-2/27 Vancomycin 2/20 x 1 dose Maxipime 2/27- 3/5 Vancomycin 3/1-3/3 Ancef 3/6-3/14 Cipro 3/20 4-3/30 Unasyn 3/30-4/4 Rocephin 4/11-4/12 Meropenem 4/12-4/17   Time spent: 25 minutes    Erin Hearing ANP  [facility] 7 am - 330 pm/M-F for direct patient care and secure chat Please refer to [place] for contact info 140  Days

## 2021-01-15 LAB — GLUCOSE, CAPILLARY
Glucose-Capillary: 134 mg/dL — ABNORMAL HIGH (ref 70–99)
Glucose-Capillary: 102 mg/dL — ABNORMAL HIGH (ref 70–99)
Glucose-Capillary: 163 mg/dL — ABNORMAL HIGH (ref 70–99)
Glucose-Capillary: 147 mg/dL — ABNORMAL HIGH (ref 70–99)
Glucose-Capillary: 117 mg/dL — ABNORMAL HIGH (ref 70–99)
Glucose-Capillary: 132 mg/dL — ABNORMAL HIGH (ref 70–99)

## 2021-01-15 MED ORDER — ACETAMINOPHEN 325 MG PO TABS
650.0000 mg | ORAL_TABLET | Freq: Four times a day (QID) | ORAL | Status: DC
  Administered 2021-01-15 – 2021-04-16 (×360): 650 mg
  Filled 2021-01-15 (×360): qty 2

## 2021-01-15 NOTE — Progress Notes (Signed)
[facility] PROGRESS NOTE  Charlene [name] RJJ:[id number] DOB: [birth year] DOA: 08/26/2020 PCP: Patient, [name] (Inactive)    October 08, 2020     10/16/2020                        10/16/2020     10/18/2020                        10/20/2020      12/30/2020                   12/30/2020   01/04/2021   Status: Remains inpatient appropriate because:Altered mental status and Unsafe d/c plan: Requirement to discharge to trach capable SNF   Dispo: The patient is from: Home              Anticipated d/c is to: SNF trach capable-[place] available-we have recently discovered that the [facility] may have a trach bed available in clinicals have been sent to the facility for review -trach team re eval 6/3 and still rec NOT to decannulate 2/2 and lack of appropriate voluntary and involuntary cough response              Patient currently is not medically stable to d/c.   Difficult to place patient Yes  **Patient's husband Charlene [name] has received conflicting information about status of Medicaid application and need for additional documentation from the hospital.  [facility] has made several attempts to contact patient's Medicaid worker without success.  They will continue to try.  We also have a contact at [place] who will also investigate to determine exactly what information the Medicaid office needs so we can complete this patient's Medicaid application process with hopeful approval   Level of care: Telemetry Medical  Code Status: Full Family Communication: husband Charlene [name] [phone]) is her POA and legal contact.  Charlene [name] was at bedside on 5/18 and given update.  Charlene [name] at bedside 6/6; mother at bedside multiple times this week with the most recent update on 6/17 DVT prophylaxis: Chronically bedbound.  5/6: We will discontinue prophylactic Lovenox   Vaccination status: Unvaccinated-husband still undecided.  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  Subsequently on 2/10, she developed obtundation and respiratory arrest leading to intubation.  She has remained persistently obtunded, now has a trach and a PEG.  Hospital course complicated by persistent fever, Serratia pneumonia and now pre-orbital cellulitis  As of 3/25 with adjustments in hypertonicity medications patient began to improve regarding hypertonicity.  Eventually started on provigil with improvements in alertness.  Evaluated by rehab physician who recommended increasing provigil dose further.  Subsequently patient has become more alert.  Most days she is able to track and with encouragement able to follow simple commands especially since partial obstruction glasses placed.  See progress note dictated 4/27 for expanded details.   2/09 Admit for Tidioute 2/10 EVD placed, later intubated  due to obtundation and respiratory arrest 2/11 MRI Brain, hypertonic saline d/c'd, EVD not working.  Husband did not consent to PICC  2/13 EVD removed 2/17 [place] discussion with family, PMT 2/24 Bedside trach and PEG 3/1 Transferred to [facility]  Subjective: Alert and interacting with her mother at bedside who is assisting with brushing her teeth.  Mother states that Charlene [name] was able to feed herself yogurt earlier today.  Charlene [name] was also observed taking her glasses and attempting to place independently.  Objective: Vitals:   01/15/21 0416 01/15/21 0430  BP: (!) 144/79   Pulse: 84 84  Resp: 18 18  Temp:  98.4 F (36.9 C)   SpO2: 100% 100%    Intake/Output Summary (Last 24 hours) at 01/15/2021 0805 Last data filed at 01/14/2021 2233 Gross per 24 hour  Intake --  Output 300 ml  Net -300 ml     Filed Weights   01/08/21 0500 01/10/21 0500 01/11/21 0500  Weight: 56 kg 56 kg 57.4 kg    Exam:       Gen: Alert, pleasant although flat affect.  Interacting with personnel in room. Respiratory: #4.0 cuffless trach, trach collar with FiO2 21% at 5L, anterior lung sounds remain clear to auscultation, there is no increased work of breathing and there are no secretions at trach site.  Unfortunately no spontaneous coughing observed either Cardiovascular: S1-S2, somewhat hypertensive today, no peripheral edema.  Sinus rhythm on bedside monitor Abdomen:  PEG for tube feeding-LBM 6/30.  Abdomen is soft nontender nondistended with normoactive bowel sounds. Musculoskeletal: Bilateral atrophy of thigh and calf muscles -knee pain resolved after injection on 6/30 Neurologic: Left facial droop, right upper extremity strength 4/5 with ability to point.  Left upper extremity strength 1/5.  Right lower extremity strength difficult to test given patient's propensity to keep knees flexed secondary to knee pain.  Left lower extremity strength about 1-2/5 Psychiatric: Awake, has purposeful movement.  Responds to verbal request with attempts to phonate and/or use of right upper extremity to communicate.  Unfortunately since she is not yet verbal orientation is very difficult to assess but she clearly is aware of her surroundings and activity in her surroundings.   Assessment/Plan: Acute problems: Multiple acute strokes  Right Thalamus ICH with IVH  Obstructive Hydrocephalus/Acute on persistent metabolic Encephalopathy/Non TBI related hypertonicity -Continue baclofen and tizanidine for spasticity. Continue Provigil and therapies -Reevaluated by CIR (5/2 ) with recommendation for SNF -Her brain injury involves corona  radiata, midbrain with associated midline shift and right basal ganglia.  These areas correlate with awareness and purposeful movement -Continues to work well and consistently with therapies.    Tracheostomy dependent -Primary barrier to decannulation is poor to at times absent cough reflex.  Inconsistent provoked cough effort and when she does cough very weak and basically ineffective -6/16 [name] d/w her husband..She documented that recovery would be very slow and at best she is weeks to months away from safe decannulation. Her very slow recovery trajectory is concerning for requirement for permanent trach/no decannulation. 6/20 reevaluated by trach team -not yet a candidate for capping 6/30 SLP considering low-level respiratory muscle strength training as patient mentation allows to improve candidacy for future decannulation.  Colonic Ileus/recurrent constipation/loose stools -Given one-time dose of Chronulac on 6/27 without results. -If no BM by 6/29 will give 3 days of Chronulac and monitor for results  Acute dental pain -CT maxillofacial without evidence of overt infection or abscess -Orajel QID symptom management -sx's greatly improved  Recurrent  right knee pain/known ligamentous injury and chronic osteoarthritis -s/p dry tap with injection of Marcaine and Depomedrol 6/30 -Continue scheduled ibuprofen x5 days -Cont Oxy elixir to 5 mg prn for more severe pain -Recent CT imaging without acute findings and current exam without evidence of effusion  Hypertensive emergency/Malignant HTN -Continue Norvasc, Lopressor and hydralazine. -Blood pressure up a little higher than baseline and patient currently not experiencing pain.  Suspect later to recent regular use of NSAID for knee pain i.e. increased volume from this drug.  NSAID discontinued in favor of scheduled Tylenol -If blood pressure does not improve in the next 48 hours or increases further we will likely need to adjust  antihypertensive regimen  Dysphagia -Continue tube feedings, Prosource liquid, free water -Continue supervised pured diet allowing family to administer orals after adequate training  History of urinary retention -Continue Cardura      Other problems: Right upper extremity thrombophlebitis - [name] -> age indeterminate SVT of right cephalic vein  Neck Contracture/torticollis -- Resolved  ESBL positive Enterobacter UTI -Has completed 5 days of meropenem -Contact isolation during hospitalization -continue Oxybutynin suspected bladder spasms  Klebsiella UTI -Resistant to nitrofurantoin and ampicillin -Meropenem transitioned to Rocephin on 5/22 -To minimize risk of recurrent UTI have asked that pure wick only be used at bedtime on a 10 PM to 8 AM schedule  Recurrent diarrhea -Resolved -Likely combination of recent antibiotics as well as scheduled laxative -MiraLAX now as needed  Bacterial Conjunctivitis /periorbital cellulitis -Resolved  Chronic pansinusitis/recurrent fevers 2/2 aspiration PNA;History of Serratia Pneumonia  Hospital Acquired Pneumonia:  -Completed several courses of antibiotic -Follow-up imaging including sinus CT consistent with chronic but stable and resolving sinusitis   Anemia -Stable  HLD -Continue Lipitor      Data Reviewed: Basic Metabolic Panel: No results for input(s): NA, K, CL, CO2, GLUCOSE, BUN, CREATININE, CALCIUM, MG, PHOS in the last 168 hours.  Liver Function Tests: No results for input(s): AST, ALT, ALKPHOS, BILITOT, PROT, ALBUMIN in the last 168 hours.  CBC: No results for input(s): WBC, NEUTROABS, HGB, HCT, MCV, PLT in the last 168 hours.   CBG: Recent Labs  Lab 01/14/21 1224 01/14/21 1543 01/14/21 1727 01/14/21 2010 01/14/21 2341  GLUCAP 124* 121* 122* 141* 167*     Studies: DG Knee 1-2 Views Right  Result Date: 01/13/2021 CLINICAL DATA:  Flexed right knee, pain EXAM: RIGHT KNEE - 1-2 VIEW COMPARISON:  None.  FINDINGS: Limited two-view exam. Flexed position. No malalignment or fracture. Negative for effusion. No significant arthropathy. IMPRESSION: No acute finding. Electronically Signed   By: Jerilynn Mages.  Shick M.D.   On: 01/13/2021 14:15     Scheduled Meds:  amLODipine  10 mg Per Tube Daily   atorvastatin  40 mg Per Tube Daily   baclofen  10 mg Per Tube 3 times per day   bupivacaine  10 mL Infiltration Once   chlorhexidine gluconate (MEDLINE KIT)  15 mL Mouth Rinse BID   Chlorhexidine Gluconate Cloth  6 each Topical Daily   diclofenac Sodium  4 g Topical QID   doxazosin  2 mg Per Tube Daily   feeding supplement (PROSource TF)  45 mL Per Tube Daily   fiber  1 packet Per Tube BID   free water  200 mL Per Tube Q4H   hydrALAZINE  50 mg Per Tube Q8H   ibuprofen  400 mg Oral Q8H   insulin aspart  0-9 Units Subcutaneous Q4H   mouth rinse  15 mL Mouth Rinse 5  X Daily   methylPREDNISolone acetate  40 mg Intra-articular Once   metoprolol tartrate  100 mg Per Tube BID   modafinil  200 mg Per Tube Daily   tiZANidine  4 mg Per Tube QHS   traMADol  25 mg Per Tube Q6H   Continuous Infusions:  sodium chloride Stopped (10/26/20 1445)   feeding supplement (OSMOLITE 1.2 CAL) 1,000 mL (01/14/21 0700)    Principal Problem:   ICH (intracerebral hemorrhage) (HCC) Active Problems:   Acute hypoxemic respiratory failure (HCC)   Tracheostomy dependent ([place])   Palliative care by specialist   Muscle hypertonicity   Oropharyngeal dysphagia   Poor prognosis   Aspiration pneumonia of right lower lobe (HCC)   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   Acute cystitis without hematuria   Ileus, unspecified ([place])   Constipation   Knee pain, right   Consultants: Neurology  Neurosurgery.  Palliative care.   PCCM continues to follow.  No decannulation.   Procedures: Tracheostomy PEG tube EEG Echocardiogram  Antibiotics: Vancomycin 2/12-2/15 Maxipime  2/12-2/15 Ancef 2/19-2/27 Vancomycin 2/20 x 1 dose Maxipime 2/27- 3/5 Vancomycin 3/1-3/3 Ancef 3/6-3/14 Cipro 3/20 4-3/30 Unasyn 3/30-4/4 Rocephin 4/11-4/12 Meropenem 4/12-4/17   Time spent: 25 minutes    Erin Hearing ANP  [facility] 7 am - 330 pm/M-F for direct patient care and secure chat Please refer to [place] for contact info 141  Days

## 2021-01-15 NOTE — Progress Notes (Signed)
CSW was able to determine that patient's assigned Medicaid worker at [employer] is Nicholas Lose at [phone]. CSW left a voicemail with Marcelle Smiling requesting a return call.  Edwin Dada, MSW, LCSW Transitions of Care  Clinical Social Worker II [phone]

## 2021-01-16 ENCOUNTER — Inpatient Hospital Stay (HOSPITAL_COMMUNITY): Payer: Medicaid Other

## 2021-01-16 LAB — URINALYSIS, ROUTINE W REFLEX MICROSCOPIC
Bilirubin Urine: NEGATIVE
Glucose, UA: NEGATIVE mg/dL
Hgb urine dipstick: NEGATIVE
Ketones, ur: NEGATIVE mg/dL
Leukocytes,Ua: NEGATIVE
Nitrite: NEGATIVE
Protein, ur: NEGATIVE mg/dL
Specific Gravity, Urine: 1.004 — ABNORMAL LOW (ref 1.005–1.030)
pH: 8 (ref 5.0–8.0)

## 2021-01-16 LAB — COMPREHENSIVE METABOLIC PANEL
ALT: 40 U/L (ref 0–44)
AST: 25 U/L (ref 15–41)
Albumin: 3.3 g/dL — ABNORMAL LOW (ref 3.5–5.0)
Alkaline Phosphatase: 94 U/L (ref 38–126)
Anion gap: 8 (ref 5–15)
BUN: 13 mg/dL (ref 6–20)
CO2: 31 mmol/L (ref 22–32)
Calcium: 9.4 mg/dL (ref 8.9–10.3)
Chloride: 99 mmol/L (ref 98–111)
Creatinine, Ser: 0.5 mg/dL (ref 0.44–1.00)
GFR, Estimated: >60 mL/min (ref >60)
Glucose, Bld: 129 mg/dL — ABNORMAL HIGH (ref 70–99)
Potassium: 3.7 mmol/L (ref 3.5–5.1)
Sodium: 138 mmol/L (ref 135–145)
Total Bilirubin: 0.6 mg/dL (ref 0.3–1.2)
Total Protein: 6.8 g/dL (ref 6.5–8.1)

## 2021-01-16 LAB — CBC WITH DIFFERENTIAL/PLATELET
Abs Immature Granulocytes: 0.01 10*3/uL (ref 0.00–0.07)
Basophils Absolute: 0 10*3/uL (ref 0.0–0.1)
Basophils Relative: 0 %
Eosinophils Absolute: 0.1 10*3/uL (ref 0.0–0.5)
Eosinophils Relative: 1 %
HCT: 33.8 % — ABNORMAL LOW (ref 36.0–46.0)
Hemoglobin: 10.6 g/dL — ABNORMAL LOW (ref 12.0–15.0)
Immature Granulocytes: 0 %
Lymphocytes Relative: 36 %
Lymphs Abs: 2.2 10*3/uL (ref 0.7–4.0)
MCH: 24.7 pg — ABNORMAL LOW (ref 26.0–34.0)
MCHC: 31.4 g/dL (ref 30.0–36.0)
MCV: 78.8 fL — ABNORMAL LOW (ref 80.0–100.0)
Monocytes Absolute: 0.5 10*3/uL (ref 0.1–1.0)
Monocytes Relative: 8 %
Neutro Abs: 3.3 10*3/uL (ref 1.7–7.7)
Neutrophils Relative %: 55 %
Platelets: 306 10*3/uL (ref 150–400)
RBC: 4.29 MIL/uL (ref 3.87–5.11)
RDW: 14.7 % (ref 11.5–15.5)
WBC: 6 10*3/uL (ref 4.0–10.5)
nRBC: 0 % (ref 0.0–0.2)

## 2021-01-16 LAB — GLUCOSE, CAPILLARY
Glucose-Capillary: 107 mg/dL — ABNORMAL HIGH (ref 70–99)
Glucose-Capillary: 108 mg/dL — ABNORMAL HIGH (ref 70–99)
Glucose-Capillary: 110 mg/dL — ABNORMAL HIGH (ref 70–99)
Glucose-Capillary: 140 mg/dL — ABNORMAL HIGH (ref 70–99)
Glucose-Capillary: 95 mg/dL (ref 70–99)

## 2021-01-16 IMAGING — DX DG CHEST 1V PORT
1 series · 1 of 1 positions shown · non-contrast
Comparison: [DATE].

CLINICAL DATA: Fever, shortness of breath.

EXAM:
PORTABLE CHEST 1 VIEW

[chest ap]
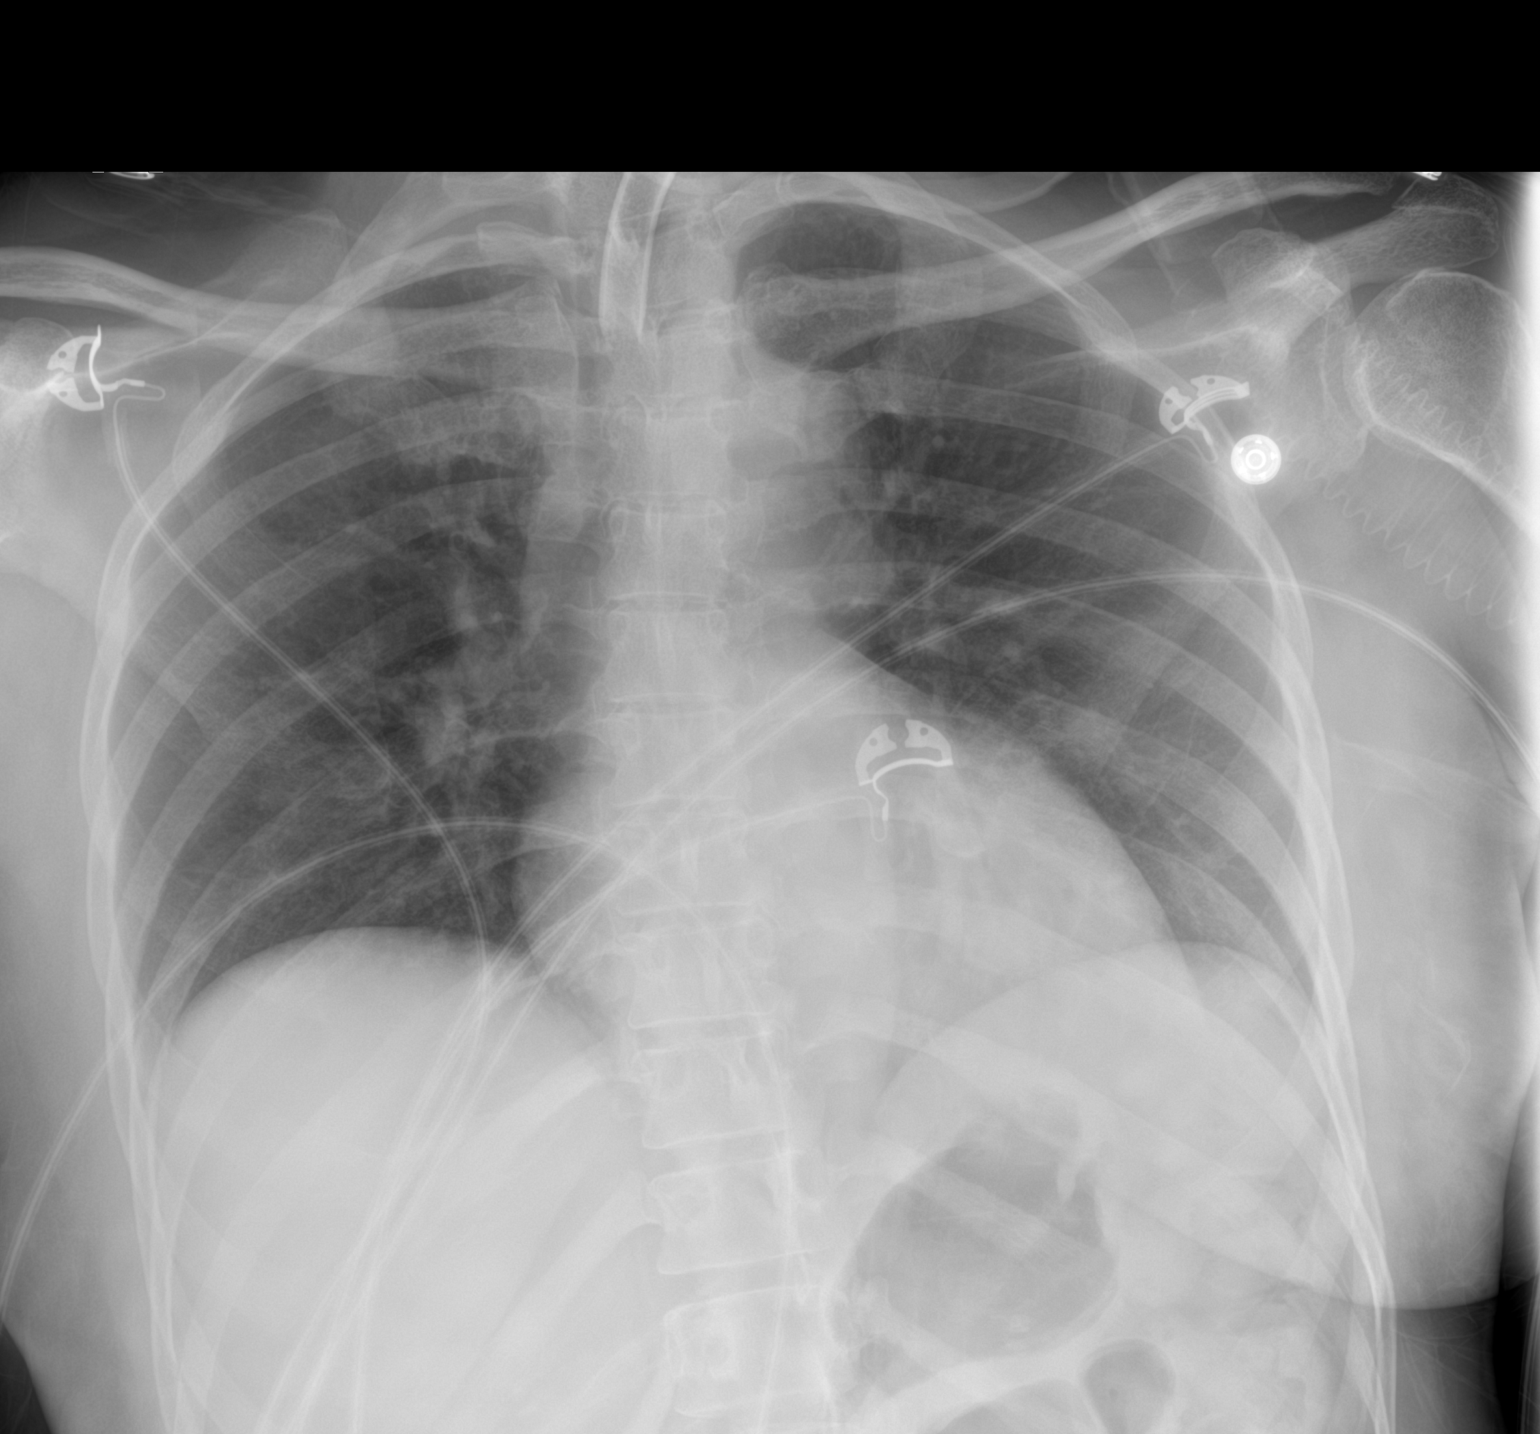

[1 of 1 positions shown; findings below may reference images not displayed]

FINDINGS: The heart size and mediastinal contours are within normal limits.
Both lungs are clear. Tracheostomy tube is in good position. The
visualized skeletal structures are unremarkable.
IMPRESSION: No active disease.

## 2021-01-16 NOTE — Progress Notes (Signed)
Progress Note  A very complex patient with long length of stay (142 days), awaiting safe disposition and is difficult to place.  Essentially with multiple acute strokes, ICH with IVH, persistent encephalopathy and tracheostomy dependent.  Subjective Overnight 7/1 noted fever of 101.8.  Does not appear that providers were informed.  No acute events per RN.  Objective Patient lying comfortably propped up in bed.  Alert.  Tracking activity around with her eyes.  Attempting to mouth something but I am unable to understand.  Follows simple instructions such as waving with her right hand, give a thumbs up, even tugged at my stethoscope when I was examining her lungs.  Vital signs: Has been afebrile since last night.  Respiratory rate and pulse rate normal.  Blood pressures fluctuating with the highest reading of 173/90.  Saturating in the high 90s on trach collar at 5 L/min.  RS: Clear to auscultation.  No increased work of breathing. CVS: S1 and S2 heard, RRR.  No JVD, murmurs or pedal edema.  Telemetry personally reviewed: Sinus rhythm. Abdomen: Nondistended, soft and nontender.  No organomegaly or masses appreciated.  PEG tube site without acute findings and functioning well.  Normal bowel sounds heard. CNS: Mental status and exam as noted above.  She was nonverbal to me. Extremities: Contractures at knees.  Knees without increased warmth or tenderness. Skin: As per discussion with nursing, she has no wounds.  Labs:  I had requested and reviewed her CBC, CMP and chest x-ray.  All unremarkable.  Chronic stable anemia of chronic disease.  Sent off blood cultures, will follow.  Check urine microscopy.  Assessment and plan:  New fever, unclear etiology.  Work-up as noted above.  Continue to monitor and if has recurrent fevers then consider further evaluation.  Did not start antimicrobials at this time.  Marcellus Scott, MD, [place], [facility]. [facility]  To contact the attending provider  between 7A-7P or the covering provider during after hours 7P-7A, please log into the web site [email or web address] and access using universal [place] password for that web site. If you do not have the password, please call the hospital operator.

## 2021-01-16 NOTE — Progress Notes (Signed)
0400 POCT CBG not crossing over into Epic.   CBG value @ 0419: 132

## 2021-01-16 NOTE — Plan of Care (Signed)
  Problem: Coping: Goal: Will identify appropriate support needs Outcome: Progressing   Problem: Health Behavior/Discharge Planning: Goal: Ability to manage health-related needs will improve Outcome: Progressing   Problem: Self-Care: Goal: Verbalization of feelings and concerns over difficulty with self-care will improve Outcome: Progressing Goal: Ability to communicate needs accurately will improve Outcome: Progressing   Problem: Nutrition: Goal: Risk of aspiration will decrease Outcome: Progressing   Problem: Intracerebral Hemorrhage Tissue Perfusion: Goal: Complications of Intracerebral Hemorrhage will be minimized Outcome: Progressing   Problem: Education: Goal: Knowledge of General Education information will improve Description: Including pain rating scale, medication(s)/side effects and non-pharmacologic comfort measures Outcome: Progressing   Problem: Health Behavior/Discharge Planning: Goal: Ability to manage health-related needs will improve Outcome: Progressing   Problem: Clinical Measurements: Goal: Ability to maintain clinical measurements within normal limits will improve Outcome: Progressing Goal: Will remain free from infection Outcome: Progressing Goal: Diagnostic test results will improve Outcome: Progressing Goal: Respiratory complications will improve Outcome: Progressing Goal: Cardiovascular complication will be avoided Outcome: Progressing   Problem: Coping: Goal: Level of anxiety will decrease Outcome: Progressing   Problem: Elimination: Goal: Will not experience complications related to bowel motility Outcome: Progressing Goal: Will not experience complications related to urinary retention Outcome: Progressing   Problem: Pain Managment: Goal: General experience of comfort will improve Outcome: Progressing   Problem: Skin Integrity: Goal: Risk for impaired skin integrity will decrease Outcome: Progressing   Problem: Intracerebral  Hemorrhage Tissue Perfusion: Goal: Complications of Intracerebral Hemorrhage will be minimized Outcome: Progressing   Problem: Ischemic Stroke/TIA Tissue Perfusion: Goal: Complications of ischemic stroke/TIA will be minimized Outcome: Progressing   

## 2021-01-17 ENCOUNTER — Inpatient Hospital Stay (HOSPITAL_COMMUNITY): Payer: Medicaid Other

## 2021-01-17 LAB — GLUCOSE, CAPILLARY: Glucose-Capillary: 105 mg/dL — ABNORMAL HIGH (ref 70–99)

## 2021-01-17 IMAGING — DX DG ABD PORTABLE 1V
1 series · 1 of 1 positions shown · non-contrast
Comparison: None.

CLINICAL DATA: Abdominal distension

EXAM:
PORTABLE ABDOMEN - 1 VIEW

[abdomen supine]
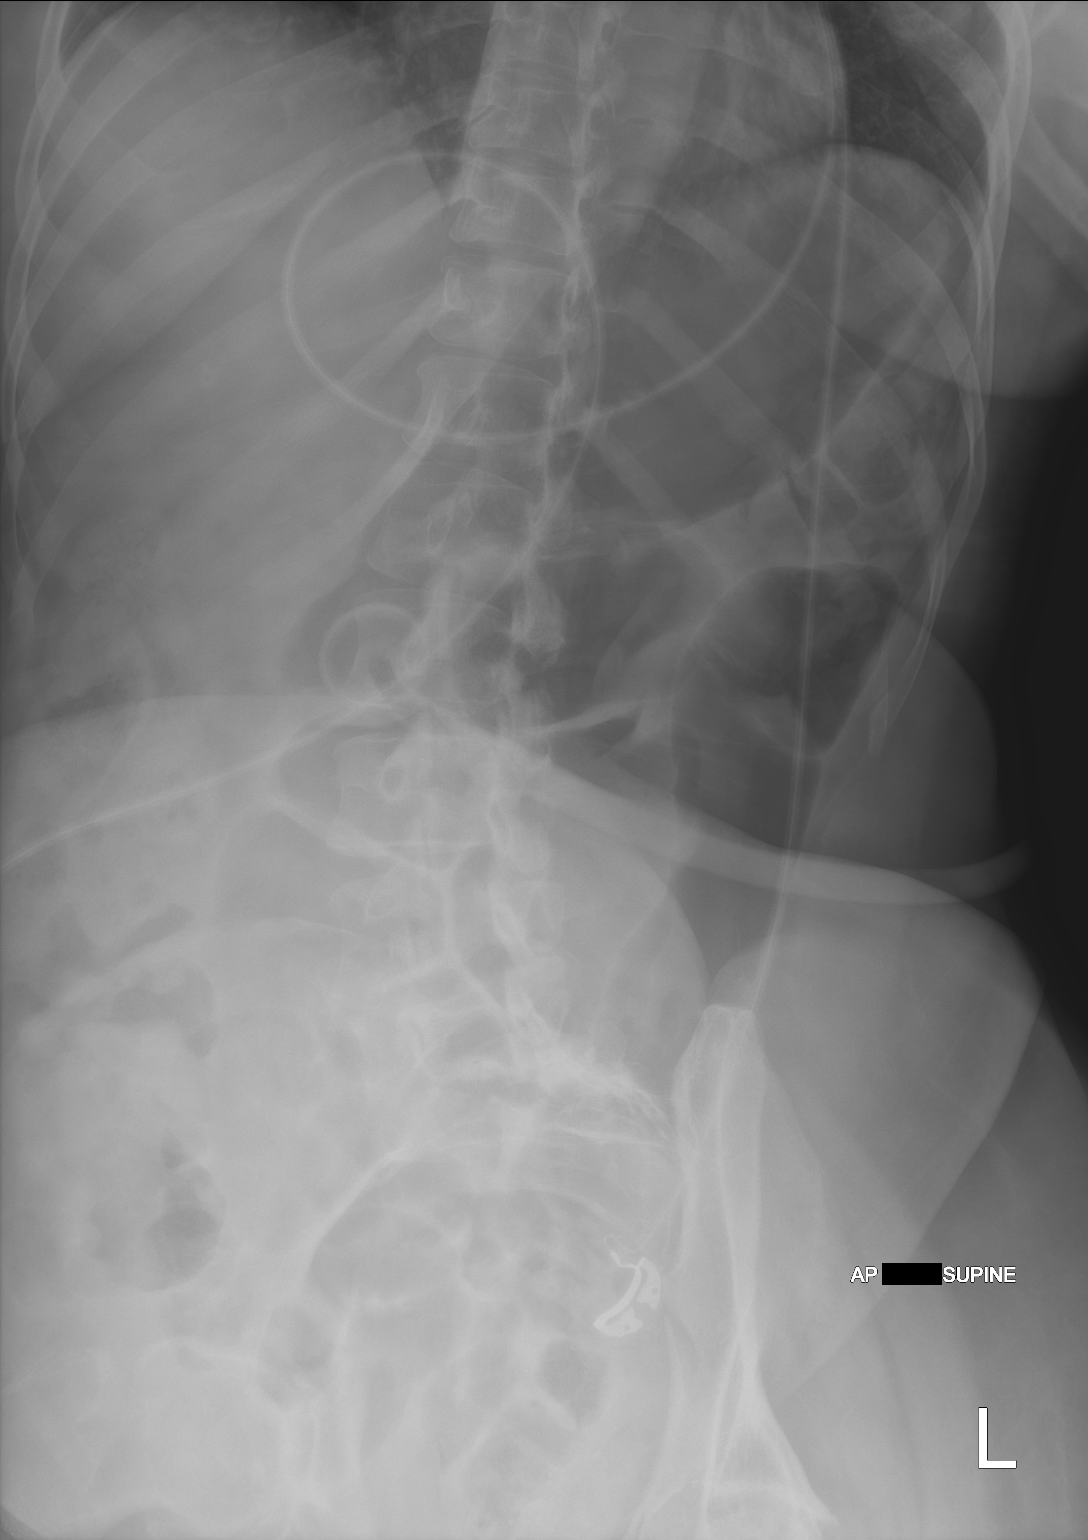

[1 of 1 positions shown; findings below may reference images not displayed]

FINDINGS: AP the abdomen is rotated. The lower bony pelvis excluded.
Percutaneous gastrostomy tube with the bulb projecting over the
gastric body. No dilated loops of large or small bowel. Gas
throughout the colon rectum.
IMPRESSION: No evidence of bowel obstruction.  The inferior pelvis is excluded

## 2021-01-17 MED ORDER — MONTELUKAST SODIUM 10 MG PO TABS: 10.0000 mg | ORAL_TABLET | Freq: Every day | ORAL | Status: DC

## 2021-01-17 MED ORDER — FLUTICASONE PROPIONATE 50 MCG/ACT NA SUSP
2.0000 | Freq: Every day | NASAL | Status: DC
  Filled 2021-01-17: qty 16

## 2021-01-17 MED ORDER — HYDROXYZINE HCL 25 MG PO TABS: 25.0000 mg | ORAL_TABLET | Freq: Three times a day (TID) | ORAL | Status: DC | PRN

## 2021-01-17 NOTE — Progress Notes (Signed)
Pt family repeated request for 8 PM med administration to [employer]. RN Olegario Messier informed Multimedia programmer.

## 2021-01-17 NOTE — Procedures (Signed)
Tracheostomy Change Note  Patient Details:   Name: Charlene [name] DOB: April 20, 1966 MRN: [id number]    Airway Documentation:     Evaluation  O2 sats: stable throughout Complications: No apparent complications Patient did tolerate procedure well. Bilateral Breath Sounds: Clear    Trach changed per protocol.   Forest Becker Annamary Buschman 01/17/2021, 5:00 PM

## 2021-01-17 NOTE — Progress Notes (Signed)
Pt's family asked RN Arlys John if BP meds could be given at 8 PM instead of 10 pm, as pt's BP at that time was elevated in 180s/120s. RN Arlys John reported the request to Charge Nurse Delice Bison and RN Olegario Messier. RN Olegario Messier rechecked pt's blood pressure, at 154/90, below parameters to administer PRN labetalol. RN night nurse not present to give report at this time, RN Olegario Messier will monitor until handoff and Charge Nurse and night RN will continue to monitor.

## 2021-01-17 NOTE — Progress Notes (Signed)
Progress Note  A very complex patient with long length of stay (142 days), awaiting safe disposition and is difficult to place.  Essentially with multiple acute strokes, ICH with IVH, persistent encephalopathy and tracheostomy dependent.  Subjective Patient nonverbal.  No further fever since night of 7/1.  Per RN, concern regarding fecal impaction because she only had a smear of a BM last night although I see 3 BMs documented over the last 24 hours and 2 since this morning.  Objective Patient lying comfortably propped up in bed.  She does not appear in any pain or discomfort.  Alert.  Tracking activity around with her eyes.   Vital signs: Afebrile.  Respiratory rate and pulse rate normal.  Blood pressures fluctuating but mostly controlled.  Saturating in the high 90s on trach collar at 5 L/min.  RS: Clear to auscultation.  No increased work of breathing. CVS: S1 and S2 heard, RRR.  No JVD, murmurs or pedal edema.  Telemetry personally reviewed: Sinus rhythm. Abdomen: Abdominal exam without acute findings.  Nondistended, soft and nontender.  Normal bowel sounds heard.  PEG tube site without acute findings and functioning well.  Normal bowel sounds heard. CNS: Mental status and exam as noted above.  She was nonverbal to me. Extremities: Contractures at knees.  Knees without increased warmth or tenderness. Skin: As per discussion with nursing, she has no wounds.  Labs:  Fever work-up negative including lab work, chest x-ray, urine microscopy.  CBC and CMP unremarkable.  Blood cultures: Negative to date.  KUB without bowel obstruction, shows gas throughout the colon and rectum.   Assessment and plan:  New fever, unclear etiology.  Work-up thus far negative.  Continue to monitor and if has recurrent fevers then consider further evaluation.  Did not start antimicrobials at this time.  Continue all other supportive care as prior.  Marcellus Scott, MD, [place], [facility]. [facility]  To contact  the attending provider between 7A-7P or the covering provider during after hours 7P-7A, please log into the web site [email or web address] and access using universal [place] password for that web site. If you do not have the password, please call the hospital operator.

## 2021-01-17 NOTE — Plan of Care (Signed)
  Problem: Coping: Goal: Will identify appropriate support needs Outcome: Progressing   Problem: Health Behavior/Discharge Planning: Goal: Ability to manage health-related needs will improve Outcome: Progressing   Problem: Self-Care: Goal: Verbalization of feelings and concerns over difficulty with self-care will improve Outcome: Progressing Goal: Ability to communicate needs accurately will improve Outcome: Progressing   Problem: Nutrition: Goal: Risk of aspiration will decrease Outcome: Progressing   Problem: Intracerebral Hemorrhage Tissue Perfusion: Goal: Complications of Intracerebral Hemorrhage will be minimized Outcome: Progressing   Problem: Education: Goal: Knowledge of General Education information will improve Description: Including pain rating scale, medication(s)/side effects and non-pharmacologic comfort measures Outcome: Progressing   Problem: Health Behavior/Discharge Planning: Goal: Ability to manage health-related needs will improve Outcome: Progressing   Problem: Clinical Measurements: Goal: Ability to maintain clinical measurements within normal limits will improve Outcome: Progressing Goal: Will remain free from infection Outcome: Progressing Goal: Diagnostic test results will improve Outcome: Progressing Goal: Respiratory complications will improve Outcome: Progressing Goal: Cardiovascular complication will be avoided Outcome: Progressing   Problem: Coping: Goal: Level of anxiety will decrease Outcome: Progressing   Problem: Elimination: Goal: Will not experience complications related to bowel motility Outcome: Progressing Goal: Will not experience complications related to urinary retention Outcome: Progressing   Problem: Pain Managment: Goal: General experience of comfort will improve Outcome: Progressing   Problem: Skin Integrity: Goal: Risk for impaired skin integrity will decrease Outcome: Progressing   Problem: Intracerebral  Hemorrhage Tissue Perfusion: Goal: Complications of Intracerebral Hemorrhage will be minimized Outcome: Progressing   Problem: Ischemic Stroke/TIA Tissue Perfusion: Goal: Complications of ischemic stroke/TIA will be minimized Outcome: Progressing   

## 2021-01-17 NOTE — Progress Notes (Signed)
MD Hongalgi notified pt abdomen distended, smear BM yesterday and this morning, MD will assess this morning, RN will continue to monitor.

## 2021-01-18 ENCOUNTER — Inpatient Hospital Stay (HOSPITAL_COMMUNITY): Payer: Medicaid Other

## 2021-01-18 DIAGNOSIS — R5081 Fever presenting with conditions classified elsewhere: Secondary | ICD-10-CM

## 2021-01-18 DIAGNOSIS — R569 Unspecified convulsions: Secondary | ICD-10-CM

## 2021-01-18 LAB — GLUCOSE, CAPILLARY
Glucose-Capillary: 89 mg/dL (ref 70–99)
Glucose-Capillary: 174 mg/dL — ABNORMAL HIGH (ref 70–99)
Glucose-Capillary: 91 mg/dL (ref 70–99)
Glucose-Capillary: 131 mg/dL — ABNORMAL HIGH (ref 70–99)
Glucose-Capillary: 177 mg/dL — ABNORMAL HIGH (ref 70–99)

## 2021-01-18 LAB — RESP PANEL BY RT-PCR (FLU A&B, COVID) ARPGX2
Influenza A by PCR: NEGATIVE
Influenza B by PCR: NEGATIVE
SARS Coronavirus 2 by RT PCR: NEGATIVE

## 2021-01-18 MED ORDER — OXYCODONE HCL 5 MG/5ML PO SOLN
2.5000 mg | ORAL | Status: DC | PRN
  Administered 2021-01-18: 2.5 mg via ORAL
  Filled 2021-01-18: qty 5

## 2021-01-18 MED ORDER — CIPROFLOXACIN 500 MG/5ML (10%) PO SUSR: 500.0000 mg | Freq: Two times a day (BID) | ORAL | Status: DC

## 2021-01-18 MED ORDER — MAGIC MOUTHWASH W/LIDOCAINE
10.0000 mL | Freq: Four times a day (QID) | ORAL | Status: DC
  Administered 2021-01-18 – 2021-02-03 (×66): 10 mL via ORAL
  Filled 2021-01-18 (×71): qty 10

## 2021-01-18 MED ORDER — SULFAMETHOXAZOLE-TRIMETHOPRIM 200-40 MG/5ML PO SUSP
20.0000 mL | Freq: Two times a day (BID) | ORAL | Status: DC
  Administered 2021-01-18 – 2021-01-19 (×4): 20 mL
  Filled 2021-01-18 (×5): qty 20

## 2021-01-18 MED ORDER — HYDRALAZINE HCL 50 MG PO TABS
75.0000 mg | ORAL_TABLET | Freq: Three times a day (TID) | ORAL | Status: DC
  Administered 2021-01-18 – 2021-01-19 (×3): 75 mg
  Filled 2021-01-18 (×3): qty 1

## 2021-01-18 NOTE — Progress Notes (Addendum)
[facility] PROGRESS NOTE  Charlene [name] RJJ:[id number] DOB: [birth year] DOA: 08/26/2020 PCP: Patient, [name] (Inactive)    October 08, 2020     10/16/2020                        10/16/2020     10/18/2020                        10/20/2020      12/30/2020                   12/30/2020   01/04/2021   Status: Remains inpatient appropriate because:Altered mental status and Unsafe d/c plan: Requirement to discharge to trach capable SNF   Dispo: The patient is from: Home              Anticipated d/c is to: SNF trach capable-[place] but [place]-we have recently discovered that the [facility] may have a trach bed available in clinicals have been sent to the facility for review -trach team re eval 6/3 and still rec NOT to decannulate 2/2 and lack of appropriate voluntary and involuntary cough response              Patient currently is not medically stable to d/c.   Difficult to place patient Yes  **Patient's [name] has received conflicting information about status of Medicaid application and need for additional documentation from the hospital.  [facility] has made several attempts to contact patient's Medicaid worker without success.  They will continue to try.  We also have a contact at [place] who will also investigate to determine exactly what information the Medicaid office needs so we can complete this patient's Medicaid application process with hopeful approval   Level of care: Telemetry Medical  Code Status: Full Family Communication: [name] [phone]) is her POA and legal contact.  [name] was at bedside on 5/18 and given update.  [name] at bedside 6/6; mother at bedside multiple times this week with the most recent update on 6/17 DVT prophylaxis: Chronically bedbound.  5/6: We will discontinue prophylactic Lovenox   Vaccination status: Unvaccinated-husband still undecided.  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  Subsequently on 2/10, she developed obtundation and respiratory arrest leading to intubation.  She has remained persistently obtunded, now has a trach and a PEG.  Hospital course complicated by persistent fever, Serratia pneumonia and now pre-orbital cellulitis  As of 3/25 with adjustments in hypertonicity medications patient began to improve regarding hypertonicity.  Eventually started on provigil with improvements in alertness.  Evaluated by rehab physician who recommended increasing provigil dose further.  Subsequently patient has become more alert.  Most days she is able to track and with encouragement able to follow simple commands especially since partial obstruction glasses placed.  See progress note dictated 4/27 for expanded details.   2/09 Admit for [place] 2/10 EVD placed, later intubated  due to obtundation and respiratory arrest 2/11 MRI Brain, hypertonic saline d/c'd, EVD not working.  Husband did not consent to PICC  2/13 EVD removed 2/17 [place] discussion with family, PMT 2/24 Bedside trach and PEG 3/1 Transferred to [facility]  Subjective: Patient much more lethargic today than usual baseline although when aggressively stimulated she is able to localize pain putting her finger in her mouth and pointing to her back teeth.  Her mother noticed some rhythmic activity involving her right foot and over the weekend her husband noted the same involving the right upper extremity with associated recent  heart rate and increased anxiety levels.  Objective: Vitals:   01/17/21 2346 01/18/21 0449  BP: (!) 155/95 (!) 143/85  Pulse: 89 82  Resp: 15 16  Temp:    SpO2: 98% 100%    Intake/Output Summary (Last 24 hours) at 01/18/2021 0753 Last data filed at 01/18/2021 0450 Gross per 24 hour  Intake --  Output 650 ml  Net -650 ml     Filed Weights   01/10/21 0500 01/11/21 0500 01/16/21 0500  Weight: 56 kg 57.4 kg 59.3 kg    Exam:       Gen: Awakens but more lethargic than baseline.  Grimacing at times and did place finger in mouth pointing to molar on right side. Respiratory: #4.0 cuffless trach, trach collar with FiO2 21% at 5L, site.  Lungs are clear to auscultation.  No increased work of breathing.  Pulse oximetry stable. Cardiovascular: S1-S2, still remains mildly hypertensive, no peripheral edema, regular nontachycardic pulse at current examination Abdomen:  PEG for tube feeding-LBM 7/3.  Soft nontender nondistended with normoactive bowel sounds. Musculoskeletal: Bilateral atrophy of thigh and calf muscles -knee pain resolved after injection on 6/30-currently has knees drawn up in fetal position and extremities are stiff and mother is having difficulty extending either leg. Neurologic: Left facial droop, right upper extremity strength 4/5 with ability to point.  Left upper extremity strength 1/5.  Right lower extremity strength difficult to test given patient's propensity to keep knees flexed secondary to knee pain.  Left lower extremity strength about 1-2/5 Psychiatric: Awake but more lethargic than baseline.  Not tracking as well today.  Nonverbal so unable to accurately assess orientation.   Assessment/Plan: Acute problems: Multiple acute strokes  Right Thalamus ICH with IVH  Obstructive Hydrocephalus/Acute on persistent metabolic Encephalopathy/Non TBI related hypertonicity -Continue baclofen and tizanidine for spasticity. Continue Provigil and therapies -Reevaluated by CIR (5/2  ) with recommendation for SNF -Her brain injury involves corona radiata, midbrain with associated midline shift and right basal ganglia.  These areas correlate with awareness and purposeful movement -Continues to work well and consistently with therapies.    Tracheostomy dependent -Primary barrier to decannulation is poor to at times absent cough reflex.  Inconsistent provoked cough effort and when she does cough very weak and basically ineffective -6/16 [name] d/w her husband..She documented that recovery would be very slow and at best she is weeks to months away from safe decannulation. Her very slow recovery trajectory is concerning for requirement for permanent trach/no decannulation. 6/20 reevaluated by trach team -not yet a candidate for capping 6/30 SLP considering low-level respiratory muscle strength training as patient mentation allows to improve candidacy for future decannulation.  Recurrent fever On 7 /1 in the early evening patient had a T-max of 31.8 F and since that time has had persistent low-grade fevers in the 99 range UA unremarkable but did not have micro he did; chest x-ray  unremarkable Blood cultures cultures pending Clear related to recurrent low-grade aspiration given her poor to absent cough reflex COVID negative With previous UTIs this admission urinalysis was unremarkable except for abnormal micro.  Since micro was not obtained with this urinalysis will obtain urine culture Since she has had persistent low-grade fever and she is more lethargic than baseline we will begin empiric Septra DS per tube pending culture result  Rhythmic activity of right side Intermittent episodes over the past 24 hours Could be solely related to need to increase antispasmodics but given rhythmic nature and other associated symptoms (increased heart rate/anxiety) need to rule out possible seizure activity Obtain EEG If definite seizure activity noted will need to discontinue baclofen in  favor of Skelaxin  Colonic Ileus/recurrent constipation/loose stools Constipation resolved-last bowel movement 7/3  Acute dental pain -CT maxillofacial without evidence of overt infection or abscess -Orajel QID symptom management -Recurrent symptoms on 7/4 so we will add Magic mouthwash with lidocaine  Recurrent right knee pain/known ligamentous injury and chronic osteoarthritis -s/p dry tap with injection of Marcaine and Depomedrol 6/30 -Continue scheduled ibuprofen x5 days -Cont Oxy elixir to 5 mg prn for more severe pain -Recent CT imaging without acute findings and current exam without evidence of effusion Patient with increased lethargy which could be related to infection.  Also her Oxy IR dose was increased Friday and this could be contributing so that dosage was decreased to 2.5  Hypertensive emergency/Malignant HTN -Continue Norvasc, Lopressor and hydralazine. -7/4 BP remains somewhat elevated so we will increase hydralazine to 50 mg every 8 hours  Dysphagia -Continue tube feedings, Prosource liquid, free water -Continue supervised pured diet allowing family to administer orals after adequate training  History of urinary retention -Continue Cardura      Other problems: Right upper extremity thrombophlebitis - [name] -> age indeterminate SVT of right cephalic vein  Neck Contracture/torticollis -- Resolved  ESBL positive Enterobacter UTI -Has completed 5 days of meropenem -Contact isolation during hospitalization -continue Oxybutynin suspected bladder spasms  Klebsiella UTI -Resistant to nitrofurantoin and ampicillin -Meropenem transitioned to Rocephin on 5/22 -To minimize risk of recurrent UTI have asked that pure wick only be used at bedtime on a 10 PM to 8 AM schedule  Recurrent diarrhea -Resolved -Likely combination of recent antibiotics as well as scheduled laxative -MiraLAX now as needed  Bacterial Conjunctivitis /periorbital  cellulitis -Resolved  Chronic pansinusitis/recurrent fevers 2/2 aspiration PNA;History of Serratia Pneumonia  Hospital Acquired Pneumonia:  -Completed several courses of antibiotic -Follow-up imaging including sinus CT consistent with chronic but stable and resolving sinusitis   Anemia -Stable  HLD -Continue Lipitor      Data Reviewed: Basic Metabolic Panel: Recent Labs  Lab 01/16/21 0702  NA 138  K 3.7  CL 99  CO2 31  GLUCOSE 129*  BUN 13  CREATININE 0.50  CALCIUM 9.4    Liver Function Tests: Recent Labs  Lab 01/16/21 0702  AST 25  ALT 40  ALKPHOS 94  BILITOT 0.6  PROT 6.8  ALBUMIN 3.3*    CBC: Recent Labs  Lab 01/16/21 0702  WBC 6.0  NEUTROABS 3.3  HGB 10.6*  HCT 33.8*  MCV 78.8*  PLT 306     CBG: Recent Labs  Lab 01/16/21 0744 01/16/21 1134 01/16/21 1713 01/16/21 2044 01/17/21 0047  GLUCAP 140* 95 110* 107* 105*     Studies: DG CHEST PORT 1 VIEW  Result Date: 01/16/2021 CLINICAL DATA:  Fever, shortness of breath. EXAM: PORTABLE CHEST 1 VIEW COMPARISON:  Dec 04, 2020. FINDINGS: The heart size and mediastinal contours are within normal limits. Both lungs are clear. Tracheostomy tube is in good position. The visualized skeletal structures are unremarkable. IMPRESSION: No active disease. Electronically Signed   By: Marijo Conception M.D.   On: 01/16/2021 08:34   DG Abd Portable 1V  Result Date: 01/17/2021 CLINICAL DATA:  Abdominal distension EXAM: PORTABLE ABDOMEN - 1 VIEW COMPARISON:  None. FINDINGS: AP the abdomen is rotated. The lower bony pelvis excluded. Percutaneous gastrostomy tube with the bulb projecting over the gastric body. No dilated loops of large or small bowel. Gas throughout the colon rectum. IMPRESSION: No evidence of bowel obstruction.  The inferior pelvis is excluded Electronically Signed   By: Suzy Bouchard M.D.   On: 01/17/2021 10:38     Scheduled Meds:  acetaminophen  650 mg Per Tube Q6H   amLODipine  10 mg Per  Tube Daily   atorvastatin  40 mg Per Tube Daily   baclofen  10 mg Per Tube 3 times per day   chlorhexidine gluconate (MEDLINE KIT)  15 mL Mouth Rinse BID   Chlorhexidine Gluconate Cloth  6 each Topical Daily   diclofenac Sodium  4 g Topical QID   doxazosin  2 mg Per Tube Daily   feeding supplement (PROSource TF)  45 mL Per Tube Daily   fiber  1 packet Per Tube BID   free water  200 mL Per Tube Q4H   hydrALAZINE  50 mg Per Tube Q8H   insulin aspart  0-9 Units Subcutaneous Q4H   mouth rinse  15 mL Mouth Rinse 5 X Daily   metoprolol tartrate  100 mg Per Tube BID   modafinil  200 mg Per Tube Daily   tiZANidine  4 mg Per Tube QHS   traMADol  25 mg Per Tube Q6H   Continuous Infusions:  sodium chloride Stopped (10/26/20 1445)   feeding supplement (OSMOLITE 1.2 CAL) 1,000 mL (01/15/21 2120)    Principal Problem:   ICH (intracerebral hemorrhage) (HCC) Active Problems:   Acute hypoxemic respiratory failure (HCC)   Tracheostomy dependent (HCC)   Palliative care by specialist   Muscle hypertonicity   Oropharyngeal dysphagia   Poor prognosis   Aspiration pneumonia of right lower lobe (HCC)   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  Palliative care.   PCCM continues to follow.  No decannulation.   Procedures: Tracheostomy PEG tube EEG Echocardiogram  Antibiotics: Vancomycin 2/12-2/15 Maxipime 2/12-2/15 Ancef 2/19-2/27 Vancomycin 2/20 x 1 dose Maxipime 2/27- 3/5 Vancomycin 3/1-3/3 Ancef 3/6-3/14 Cipro 3/20 4-3/30 Unasyn 3/30-4/4 Rocephin 4/11-4/12 Meropenem 4/12-4/17   Time spent: 25 minutes    Erin Hearing ANP  [facility] 7 am - 330 pm/M-F for direct patient care and secure chat Please refer to [place] for contact info 144  Days

## 2021-01-18 NOTE — Plan of Care (Signed)
  Problem: Coping: Goal: Will identify appropriate support needs Outcome: Progressing   Problem: Health Behavior/Discharge Planning: Goal: Ability to manage health-related needs will improve Outcome: Progressing   Problem: Self-Care: Goal: Verbalization of feelings and concerns over difficulty with self-care will improve Outcome: Progressing Goal: Ability to communicate needs accurately will improve Outcome: Progressing   Problem: Nutrition: Goal: Risk of aspiration will decrease Outcome: Progressing   Problem: Intracerebral Hemorrhage Tissue Perfusion: Goal: Complications of Intracerebral Hemorrhage will be minimized Outcome: Progressing   Problem: Education: Goal: Knowledge of General Education information will improve Description: Including pain rating scale, medication(s)/side effects and non-pharmacologic comfort measures Outcome: Progressing   Problem: Health Behavior/Discharge Planning: Goal: Ability to manage health-related needs will improve Outcome: Progressing   Problem: Clinical Measurements: Goal: Respiratory complications will improve Outcome: Progressing Goal: Cardiovascular complication will be avoided Outcome: Progressing   Problem: Coping: Goal: Level of anxiety will decrease Outcome: Progressing   Problem: Elimination: Goal: Will not experience complications related to bowel motility Outcome: Progressing Goal: Will not experience complications related to urinary retention Outcome: Progressing   Problem: Pain Managment: Goal: General experience of comfort will improve Outcome: Progressing   Problem: Skin Integrity: Goal: Risk for impaired skin integrity will decrease Outcome: Progressing   Problem: Intracerebral Hemorrhage Tissue Perfusion: Goal: Complications of Intracerebral Hemorrhage will be minimized Outcome: Progressing   Problem: Ischemic Stroke/TIA Tissue Perfusion: Goal: Complications of ischemic stroke/TIA will be  minimized Outcome: Progressing

## 2021-01-18 NOTE — Progress Notes (Signed)
Ok to change cipro to septra given per tube due to interaction issue with TF per Junious Silk.  Ulyses Southward, PharmD, BCIDP, AAHIVP, CPP Infectious Disease Pharmacist 01/18/2021 10:53 AM

## 2021-01-18 NOTE — Procedures (Signed)
Patient Name: Charlene [name]  MRN: [id number]  Epilepsy Attending: Charlsie Quest  Referring Physician/Provider: Junious Silk, NP Date: 01/18/2021 Duration: 22.34 mins  Patient history: 55yo F with R thalamic/BG ICH with worsening mentation.  Her mother noticed some rhythmic activity involving her right foot and over the weekend her husband noted the same involving the right upper extremity with associated recent heart rate and increased anxiety levels. EEG to evaluate for seizure.  Level of alertness: Awake  AEDs during EEG study: None  Technical aspects: This EEG study was done with scalp electrodes positioned according to the 10-20 International system of electrode placement. Electrical activity was acquired at a sampling rate of 500Hz  and reviewed with a high frequency filter of 70Hz  and a low frequency filter of 1Hz . EEG data were recorded continuously and digitally stored.   Description: The posterior dominant rhythm consists of 8-9 Hz activity of moderate voltage (25-35 uV) seen predominantly in posterior head regions, symmetric and reactive to eye opening and eye closing. EEG showed intermittent generalized and maximal right centro-temporal region 3 to 6 Hz theta-delta slowing. Hyperventilation and photic stimulation were not performed.     ABNORMALITY - Intermittent slow, generalized and maximal right centro-temporal region  IMPRESSION: This study is suggestive of ortical dysfunction arising from right centro-temporal region, nonspecific etiology but likely secondary to underlying bleed. Additionally, there is mild diffuse encephalopathy, nonspecific etiology. No seizures or epileptiform discharges were seen throughout the recording.  Colleen Donahoe 

## 2021-01-18 NOTE — Progress Notes (Signed)
EEG complete - results pending 

## 2021-01-19 LAB — GLUCOSE, CAPILLARY
Glucose-Capillary: 102 mg/dL — ABNORMAL HIGH (ref 70–99)
Glucose-Capillary: 105 mg/dL — ABNORMAL HIGH (ref 70–99)
Glucose-Capillary: 106 mg/dL — ABNORMAL HIGH (ref 70–99)
Glucose-Capillary: 110 mg/dL — ABNORMAL HIGH (ref 70–99)
Glucose-Capillary: 117 mg/dL — ABNORMAL HIGH (ref 70–99)
Glucose-Capillary: 128 mg/dL — ABNORMAL HIGH (ref 70–99)
Glucose-Capillary: 129 mg/dL — ABNORMAL HIGH (ref 70–99)
Glucose-Capillary: 130 mg/dL — ABNORMAL HIGH (ref 70–99)
Glucose-Capillary: 132 mg/dL — ABNORMAL HIGH (ref 70–99)
Glucose-Capillary: 132 mg/dL — ABNORMAL HIGH (ref 70–99)
Glucose-Capillary: 133 mg/dL — ABNORMAL HIGH (ref 70–99)
Glucose-Capillary: 166 mg/dL — ABNORMAL HIGH (ref 70–99)
Glucose-Capillary: 93 mg/dL (ref 70–99)
Glucose-Capillary: 97 mg/dL (ref 70–99)
Glucose-Capillary: 97 mg/dL (ref 70–99)

## 2021-01-19 MED ORDER — CELECOXIB 200 MG PO CAPS
200.0000 mg | ORAL_CAPSULE | Freq: Two times a day (BID) | ORAL | Status: DC
  Administered 2021-01-19 – 2021-03-22 (×125): 200 mg
  Filled 2021-01-19 (×126): qty 1

## 2021-01-19 MED ORDER — HYDRALAZINE HCL 50 MG PO TABS: 50.0000 mg | ORAL_TABLET | Freq: Three times a day (TID) | ORAL | Status: DC

## 2021-01-19 MED ORDER — METOPROLOL TARTRATE 25 MG/10 ML ORAL SUSPENSION
100.0000 mg | Freq: Two times a day (BID) | ORAL | Status: DC
  Administered 2021-01-20 – 2021-01-28 (×18): 100 mg
  Filled 2021-01-19 (×20): qty 40

## 2021-01-19 MED ORDER — HYDRALAZINE HCL 25 MG PO TABS
37.5000 mg | ORAL_TABLET | Freq: Three times a day (TID) | ORAL | Status: DC
  Administered 2021-01-20 – 2021-04-16 (×255): 37.5 mg
  Filled 2021-01-19 (×260): qty 2

## 2021-01-19 MED ORDER — PANTOPRAZOLE SODIUM 40 MG PO PACK
40.0000 mg | PACK | Freq: Every day | ORAL | Status: DC
  Administered 2021-01-19 – 2021-04-01 (×73): 40 mg
  Filled 2021-01-19 (×73): qty 20

## 2021-01-19 NOTE — Progress Notes (Signed)
CSW spoke with patient's assigned Medicaid worker, Nicholas Lose of [facility] DSS to discuss patient's application. Charlene [name] states she is waiting on the decision from Disability Determination Services and that she does not need any additional documentation at this time. Charlene [name] was unable to provide CSW with the date that the application was submitted due to CSW not being the legally authorized representative.  Edwin Dada, MSW, LCSW Transitions of Care  Clinical Social Worker II [phone]

## 2021-01-19 NOTE — Progress Notes (Addendum)
[facility] PROGRESS NOTE  Charlene [name] SLH:[id number] DOB: [birth year] DOA: 08/26/2020 PCP: Patient, [name] (Inactive)    October 08, 2020     10/16/2020                        10/16/2020     10/18/2020                        10/20/2020      12/30/2020                   12/30/2020   01/04/2021   Status: Remains inpatient appropriate because:Altered mental status and Unsafe d/c plan: Requirement to discharge to trach capable SNF   Dispo: The patient is from: Home              Anticipated d/c is to: SNF trach capable-[place]-we have recently discovered that the [facility] may have a trach bed available in clinicals have been sent to the facility for review -trach team re eval 6/3 and still rec NOT to decannulate 2/2 and lack of appropriate voluntary and involuntary cough response              Patient currently is medically stable to d/c.   Difficult to place patient Yes  **7/5: SW spoke directly with patient's Medicaid coordinator who confirmed that they do not need any additional information from the hospital regarding her application.  Unable to proceed any further until disability approved.  **Husband at bedside-updated on recent med changes and recurrent UTI. He had questions about contacting Disability-CM provided number  Level of care: Telemetry Medical  Code Status: Full Family Communication: [name] [phone]) is her POA and legal contact.  Mother at bedside 7/4 DVT prophylaxis: Chronically bedbound.  5/6: We will discontinue prophylactic Lovenox   Vaccination status: Unvaccinated-husband still undecided.  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  Subsequently on 2/10, she developed obtundation and respiratory arrest leading to intubation.  She has remained persistently obtunded, now has a trach and a PEG.  Hospital course complicated by persistent fever, Serratia pneumonia and now pre-orbital cellulitis  As of 3/25 with adjustments in hypertonicity medications patient began to improve regarding hypertonicity.  Eventually started on provigil with improvements in alertness.  Evaluated by rehab physician who recommended increasing provigil dose further.  Subsequently patient has become more alert.  Most days she is able to track and with encouragement able to follow simple commands especially since partial obstruction glasses placed.  See progress note dictated 4/27 for expanded details.   2/09 Admit for [place] 2/10 EVD placed, later intubated due to obtundation and respiratory arrest 2/11 MRI Brain, hypertonic saline d/c'd, EVD not working.  Husband did not consent to PICC  2/13 EVD removed 2/17 [place] discussion with family, PMT 2/24 Bedside trach and PEG 3/1 Transferred to [facility]  Subjective: Patient is awake  and alert.  Continues to hold me and when I asked her if her knee was hurting she nodded yes.  She also grimaced with any attempts to extend right lower extremity.  Objective: Vitals:   01/19/21 0327 01/19/21 0354  BP: (!) 145/77 121/73  Pulse: 81 74  Resp: 15 12  Temp: 98.7 F (37.1 C)   SpO2: 99% 100%    Intake/Output Summary (Last 24 hours) at 01/19/2021 0743 Last data filed at 01/18/2021 1300 Gross per 24 hour  Intake --  Output 652 ml  Net -652 ml     Filed Weights   01/11/21 0500 01/16/21 0500 01/19/21 0500  Weight: 57.4 kg 59.3 kg 62.7 kg    Exam:        Gen: Grimacing secondary to ongoing right knee pain. Respiratory: #4.0 cuffless trach, trach collar with FiO2 21% at 5L, anterior lung sounds are clear, no increased work of breathing, weak cough reflex persists Cardiovascular: S1-S2, normotensive today, regular pulse, no peripheral edema Abdomen:  PEG for tube feeding-LBM 7/4.  Soft nontender nondistended with palpation with normoactive bowel sounds Musculoskeletal: Received right knee injection per Ortho team 6/30 with initial resolution in pain but pain has returned.  Patient noted to be holding right knee and nodded in the affirmative when asked if her knee was painful Neurologic: Left facial droop, right upper extremity strength 4/5 with ability to point.  Left upper extremity strength 1/5.  Right lower extremity strength difficult to test given patient's propensity to keep knees flexed secondary to knee pain.  Left lower extremity strength about 1-2/5 Psychiatric: Alert, appears to be oriented but she is nonverbal so difficult to ascertain correctly.  She will attempt to phonate and does follow commands slowly   Assessment/Plan: Acute problems: Multiple acute strokes  Right Thalamus ICH with IVH  Obstructive Hydrocephalus/Acute on persistent metabolic Encephalopathy/Non TBI related hypertonicity -Continue baclofen and tizanidine for spasticity. Continue Provigil and therapies -Reevaluated by CIR (5/2 ) with recommendation for SNF -Her brain injury involves corona radiata, midbrain with associated midline shift and right basal ganglia.  These areas correlate with awareness and purposeful movement -Continues to work well and consistently with therapies.    Tracheostomy dependent -Primary barrier to decannulation is poor to at times absent cough reflex.  Inconsistent provoked cough effort and when she does cough very weak and basically ineffective -6/16 [name] d/w her husband..She documented that recovery would be very slow and at best she  is weeks to months away from safe decannulation. Her very slow recovery trajectory is concerning for requirement for permanent trach/no decannulation. 6/20 reevaluated by trach team -not yet a candidate for capping 6/30 SLP considering low-level respiratory muscle strength training as patient mentation allows to improve candidacy for future decannulation.  Recurrent Klebsiella UTI >100,000 colonies-sensitivities pending Continue empiric Septra DS Less lethargic today  Rhythmic activity of right side Intermittent episodes x 2 7/3 and 7/4 No apparent recurrent EEG negative for seizure activity  Colonic Ileus/recurrent constipation/loose stools Constipation resolved-last bowel movement 7/4  Acute dental pain -CT maxillofacial without evidence of overt infection or abscess -Orajel QID symptom management -Continue Magic mouthwash with lidocaine  Recurrent right knee pain/known ligamentous injury and chronic osteoarthritis -s/p dry tap with injection of Marcaine and Depomedrol 6/30 -Continue scheduled ibuprofen x5 days -Cont Oxy elixir to 2.5 mg prn for more severe pain -Recent CT imaging without acute findings and current exam without evidence of effusion -7/5: Add Celebrex BID w/ PPI given reemergence of pain and  desire to minimize narcotic pain medication  Hypertensive emergency/Malignant HTN -Continue Norvasc, Lopressor and hydralazine. -7/4 hydralazine increased to 50 mg every 8 hours with significant improvement in BP readings but after kunch SBP 109- opted to decrease to 37.5 mg and follow closely  Dysphagia -Continue tube feedings, Prosource liquid, free water -Continue supervised pured diet allowing family to administer orals after adequate training  History of urinary retention -Continue Cardura      Other problems: Right upper extremity thrombophlebitis - [name] -> age indeterminate SVT of right cephalic vein  Neck Contracture/torticollis -- Resolved  ESBL  positive Enterobacter UTI -Has completed 5 days of meropenem -Contact isolation during hospitalization -continue Oxybutynin suspected bladder spasms  Klebsiella UTI -Resistant to nitrofurantoin and ampicillin -Meropenem transitioned to Rocephin on 5/22 -To minimize risk of recurrent UTI have asked that pure wick only be used at bedtime on a 10 PM to 8 AM schedule  Recurrent diarrhea -Resolved -Likely combination of recent antibiotics as well as scheduled laxative -MiraLAX now as needed  Bacterial Conjunctivitis /periorbital cellulitis -Resolved  Chronic pansinusitis/recurrent fevers 2/2 aspiration PNA;History of Serratia Pneumonia  Hospital Acquired Pneumonia:  -Completed several courses of antibiotic -Follow-up imaging including sinus CT consistent with chronic but stable and resolving sinusitis   Anemia -Stable  HLD -Continue Lipitor      Data Reviewed: Basic Metabolic Panel: Recent Labs  Lab 01/16/21 0702  NA 138  K 3.7  CL 99  CO2 31  GLUCOSE 129*  BUN 13  CREATININE 0.50  CALCIUM 9.4    Liver Function Tests: Recent Labs  Lab 01/16/21 0702  AST 25  ALT 40  ALKPHOS 94  BILITOT 0.6  PROT 6.8  ALBUMIN 3.3*    CBC: Recent Labs  Lab 01/16/21 0702  WBC 6.0  NEUTROABS 3.3  HGB 10.6*  HCT 33.8*  MCV 78.8*  PLT 306     CBG: Recent Labs  Lab 01/18/21 0820 01/18/21 1204 01/18/21 1709 01/18/21 1938 01/18/21 2327  GLUCAP 89 174* 91 131* 177*     Studies: DG Abd Portable 1V  Result Date: 01/17/2021 CLINICAL DATA:  Abdominal distension EXAM: PORTABLE ABDOMEN - 1 VIEW COMPARISON:  None. FINDINGS: AP the abdomen is rotated. The lower bony pelvis excluded. Percutaneous gastrostomy tube with the bulb projecting over the gastric body. No dilated loops of large or small bowel. Gas throughout the colon rectum. IMPRESSION: No evidence of bowel obstruction.  The inferior pelvis is excluded Electronically Signed   By: Suzy Bouchard M.D.   On:  01/17/2021 10:38   EEG adult  Result Date: 01/18/2021 Lora Havens, MD     01/18/2021  5:20 PM Patient Name: Charlene [name] MRN: [id number] Epilepsy Attending: Lora Havens Referring Physician/Provider: Erin Hearing, NP Date: 01/18/2021 Duration: 22.34 mins Patient history: 55yo F with R thalamic/BG ICH with worsening mentation.  Her mother noticed some rhythmic activity involving her right foot and over the weekend her husband noted the same involving the right upper extremity with associated recent heart rate and increased anxiety levels. EEG to evaluate for seizure. Level of alertness: Awake AEDs during EEG study: None Technical aspects: This EEG study was done with scalp electrodes positioned according to the 10-20 International system of electrode placement. Electrical activity was acquired at a sampling rate of '500Hz'  and reviewed with a high frequency filter of '70Hz'  and a low frequency filter of '1Hz' . EEG data were recorded continuously and digitally stored. Description: The posterior dominant rhythm consists of 8-9 Hz activity  of moderate voltage (25-35 uV) seen predominantly in posterior head regions, symmetric and reactive to eye opening and eye closing. EEG showed intermittent generalized and maximal right centro-temporal region 3 to 6 Hz theta-delta slowing. Hyperventilation and photic stimulation were not performed.   ABNORMALITY - Intermittent slow, generalized and maximal right centro-temporal region IMPRESSION: This study is suggestive of ortical dysfunction arising from right centro-temporal region, nonspecific etiology but likely secondary to underlying bleed. Additionally, there is mild diffuse encephalopathy, nonspecific etiology. No seizures or epileptiform discharges were seen throughout the recording. Priyanka Barbra Sarks     Scheduled Meds:  acetaminophen  650 mg Per Tube Q6H   amLODipine  10 mg Per Tube Daily   atorvastatin  40 mg Per Tube Daily   baclofen  10 mg Per Tube 3 times  per day   chlorhexidine gluconate (MEDLINE KIT)  15 mL Mouth Rinse BID   Chlorhexidine Gluconate Cloth  6 each Topical Daily   diclofenac Sodium  4 g Topical QID   doxazosin  2 mg Per Tube Daily   feeding supplement (PROSource TF)  45 mL Per Tube Daily   fiber  1 packet Per Tube BID   free water  200 mL Per Tube Q4H   hydrALAZINE  75 mg Per Tube Q8H   insulin aspart  0-9 Units Subcutaneous Q4H   magic mouthwash w/lidocaine  10 mL Oral QID   mouth rinse  15 mL Mouth Rinse 5 X Daily   metoprolol tartrate  100 mg Per Tube BID   modafinil  200 mg Per Tube Daily   sulfamethoxazole-trimethoprim  20 mL Per Tube Q12H   tiZANidine  4 mg Per Tube QHS   traMADol  25 mg Per Tube Q6H   Continuous Infusions:  sodium chloride Stopped (10/26/20 1445)   feeding supplement (OSMOLITE 1.2 CAL) 1,000 mL (01/18/21 1126)    Principal Problem:   ICH (intracerebral hemorrhage) (HCC) Active Problems:   Acute hypoxemic respiratory failure (HCC)   Tracheostomy dependent (HCC)   Palliative care by specialist   Muscle hypertonicity   Oropharyngeal dysphagia   Poor prognosis   Aspiration pneumonia of right lower lobe (HCC)   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  Palliative care.   PCCM continues to follow.  No decannulation.   Procedures: Tracheostomy PEG tube EEG Echocardiogram  Antibiotics: Vancomycin 2/12-2/15 Maxipime 2/12-2/15 Ancef 2/19-2/27 Vancomycin 2/20 x 1 dose Maxipime 2/27- 3/5 Vancomycin 3/1-3/3 Ancef 3/6-3/14 Cipro 3/20 4-3/30 Unasyn 3/30-4/4 Rocephin 4/11-4/12 Meropenem 4/12-4/17   Time spent: 25 minutes    Erin Hearing ANP  [facility] 7 am - 330 pm/M-F for direct patient care and secure chat Please refer to [place] for contact info 145  Days

## 2021-01-19 NOTE — Progress Notes (Signed)
Physical Therapy Treatment Patient Details Name: Charlene [name] MRN: [id number] DOB: October 05, 1965 Today's Date: 01/19/2021    History of Present Illness Pt is 54 y/o female presents to [facility] on 08/26/20 with L sided weakness and headaches. 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 s/p Right frontal ventricular catheter placement on 2/10, stopped functioning on 2/11.  MRI 2/11 shows right greater than left mesial temporal lobes / parahippocampal cortex, bilateral basal ganglia, and possibly the upper pons that are concerning for acute infarcts, most likely secondary to mass effect from the hemorrhage. ETT 2/10-2/24.  Pt with trach and PEG on 09/10/20. Hospital course complicated by persistent fever, Serratia pneumonia and now pre-orbital cellulitis.   PMH includes anxiety, HTN.    PT Comments    Pt received supine in bed with husband present. Tot A to come to EOB working on gentle thoracic and lumbar lateral flexion and rotation in sitting as well as gentle cervical rotation in sitting. Pt following commands to direct gaze toward therapist and husband. Pt tolerated 30 mins sitting EOB with reaching activities along tray table beginning with hand over hand but pt continuing on her own with supported R elbow. Passive stretching to B kneed evoked grimace from pt. PRAFO's donned end of session. PT will continue to follow.    Follow Up Recommendations  SNF     Equipment Recommendations  Wheelchair (measurements PT);Wheelchair cushion (measurements PT);Hospital bed;Other (comment) (18x18 tilt in space, hoyer lift)    Recommendations for Other Services Rehab consult     Precautions / Restrictions Precautions Precautions: Fall Precaution Comments: peg and trach Required Braces or Orthoses: Other Brace Other Brace: RLE soft knee brace and LUE soft elbow brace  (wearing schedule in RN orders); bil PRAFOs Restrictions Weight Bearing Restrictions: No    Mobility  Bed Mobility Overal bed mobility: Needs Assistance Bed Mobility: Rolling;Sidelying to Sit;Sit to Supine Rolling: Total assist;+2 for physical assistance Sidelying to sit: Total assist;+2 for physical assistance   Sit to supine: Total assist   General bed mobility comments: tot A to come to EOB and return to supine    Transfers                 General transfer comment: worked in sitting EOB today  Ambulation/Gait             General Gait Details: non ambulatory   Stairs             Wheelchair Mobility    Modified Rankin (Stroke Patients Only) Modified Rankin (Stroke Patients Only) Pre-Morbid Rankin Score: No symptoms Modified Rankin: Severe disability     Balance Overall balance assessment: Needs assistance Sitting-balance support: Feet unsupported;Single extremity supported Sitting balance-Leahy Scale: Poor Sitting balance - Comments: Mostly mod assist EOB to support trunk.  Pt uses extension tone in R arm to prop in sitting, but when arm released, needs more support at trunk.  Worked in sitting EOB >30 mins with reaching out to husband as well as reaching along tray table and hand over hand lotion application to each hand Postural control: Posterior lean;Left lateral lean                                  Cognition Arousal/Alertness: Awake/alert Behavior During Therapy: Flat affect Overall Cognitive Status: Difficult to assess  Area of Impairment: Attention;Following commands;Problem solving                 Orientation Level:  (seems to recognize family, especially responsive to her husband, [name] today) Current Attention Level: Sustained Memory: Decreased short-term memory Following Commands: Follows one step commands inconsistently;Follows one step commands with increased time Safety/Judgement: Decreased awareness of  deficits Awareness: Intellectual Problem Solving: Slow processing;Decreased initiation;Requires verbal cues;Requires tactile cues General Comments: pt followed commands intermittently today, did better when RUE was support and relaxed. Also did better when therapist had husband give commands      Exercises General Exercises - Lower Extremity Long Arc Quad: PROM;Both;Seated;5 reps Other Exercises Other Exercises: PRAFO's placed bilaterally after session Other Exercises: gentle lumbar and thoracic lateral flexion and rotation in sitting    General Comments General comments (skin integrity, edema, etc.): VSS      Pertinent Vitals/Pain Pain Assessment: Faces Faces Pain Scale: Hurts little more Pain Location: B knees Pain Descriptors / Indicators: Grimacing;Guarding Pain Intervention(s): Monitored during session    Home Living                      Prior Function            PT Goals (current goals can now be found in the care plan section) Acute Rehab PT Goals Patient Stated Goal: Pt unable to state PT Goal Formulation: Patient unable to participate in goal setting Time For Goal Achievement: 01/25/21 Potential to Achieve Goals: Poor Progress towards PT goals: Progressing toward goals    Frequency    Min 2X/week      PT Plan Current plan remains appropriate    Co-evaluation              AM-PAC PT "6 Clicks" Mobility   Outcome Measure  Help needed turning from your back to your side while in a flat bed without using bedrails?: Total Help needed moving from lying on your back to sitting on the side of a flat bed without using bedrails?: Total Help needed moving to and from a bed to a chair (including a wheelchair)?: Total Help needed standing up from a chair using your arms (e.g., wheelchair or bedside chair)?: Total Help needed to walk in hospital room?: Total Help needed climbing 3-5 steps with a railing? : Total 6 Click Score: 6    End of Session    Activity Tolerance: Patient tolerated treatment well Patient left: in bed;with call bell/phone within reach;with family/visitor present Nurse Communication: Mobility status PT Visit Diagnosis: Hemiplegia and hemiparesis;Other symptoms and signs involving the nervous system (R29.898);Other abnormalities of gait and mobility (R26.89) Hemiplegia - Right/Left: Right (R LE and left UE/LE) Hemiplegia - dominant/non-dominant: Dominant Hemiplegia - caused by: Cerebral infarction;Nontraumatic intracerebral hemorrhage;Other Nontraumatic intracranial hemorrhage     Time: 8413-2440 PT Time Calculation (min) (ACUTE ONLY): 36 min  Charges:  $Therapeutic Exercise: 8-22 mins $Therapeutic Activity: 8-22 mins                     Lyanne Co, PT  Acute Rehab Services  Pager [phone] Office [phone]    Lawana Chambers Jerika Wales 01/19/2021, 6:05 PM

## 2021-01-19 NOTE — TOC Progression Note (Signed)
Transition of Care [facility]) - Progression Note    Patient Details  Name: DARLENA KOVAL MRN: [id number] Date of Birth: 08/27/1965  [facility] of [facility]) CM/SW Contact  Janae Bridgeman, RN Phone Number: 01/19/2021, 1:16 PM  Clinical Narrative:    Case management gave the patient's husband, Alison Murray, contact numbers again for Nicholas Lose, DSS worker at [phone] and follow up number for [facility] for disability assistance at [phone].  CM and MSW will continue to follow the patient for SNF placement.   Expected Discharge Plan: Skilled Nursing Facility Barriers to Discharge: Continued Medical Work up  Expected Discharge Plan and Services Expected Discharge Plan: Skilled Nursing Facility In-house Referral: Clinical Social Work Discharge Planning Services: CM Consult Post Acute Care Choice: Skilled Nursing Facility Living arrangements for the past 2 months: Single Family Home                                       Social Determinants of Health (SDOH) Interventions    Readmission Risk Interventions Readmission Risk Prevention Plan 09/29/2020  Transportation Screening Complete  PCP or Specialist Appt within 3-5 Days Complete  HRI or Home Care Consult Complete  Social Work Consult for Recovery Care Planning/Counseling Complete  Palliative Care Screening Complete  Medication Review [employer]) Complete  Some recent data might be hidden

## 2021-01-20 LAB — URINE CULTURE: Culture: >=100000 — AB

## 2021-01-20 LAB — GLUCOSE, CAPILLARY
Glucose-Capillary: 100 mg/dL — ABNORMAL HIGH (ref 70–99)
Glucose-Capillary: 127 mg/dL — ABNORMAL HIGH (ref 70–99)
Glucose-Capillary: 84 mg/dL (ref 70–99)
Glucose-Capillary: 128 mg/dL — ABNORMAL HIGH (ref 70–99)
Glucose-Capillary: 101 mg/dL — ABNORMAL HIGH (ref 70–99)
Glucose-Capillary: 91 mg/dL (ref 70–99)

## 2021-01-20 MED ORDER — SODIUM CHLORIDE 0.9 % IV SOLN
1.0000 g | Freq: Three times a day (TID) | INTRAVENOUS | Status: DC
  Filled 2021-01-20 (×2): qty 1

## 2021-01-20 MED ORDER — OXYCODONE HCL 5 MG/5ML PO SOLN: 5.0000 mg | ORAL | Status: DC | PRN

## 2021-01-20 MED ORDER — SULFAMETHOXAZOLE-TRIMETHOPRIM 200-40 MG/5ML PO SUSP
20.0000 mL | Freq: Two times a day (BID) | ORAL | Status: AC
  Administered 2021-01-20 (×2): 20 mL
  Filled 2021-01-20 (×2): qty 20

## 2021-01-20 NOTE — Progress Notes (Signed)
  Speech Language Pathology Treatment: Cognitive-Linquistic;Dysphagia  Patient Details Name: Charlene [name] MRN: [id number] DOB: Mar 26, 1966 Today's Date: 01/20/2021 Time: 5366-4403 SLP Time Calculation (min) (ACUTE ONLY): 31 min  Assessment / Plan / Recommendation Clinical Impression  Daughter, Charlene [name], at bedside.  Pt alert and participatory.  PMV placed and she was repositioned in bed to optimize participation. Pt fed herself an entire container of yogurt with excellent initiation, mild left spillage, no s/s of aspiration, improved oral clearance after each bolus.   She was provided with trials of ice water and fed herself three oz over five minutes with one mild cough, no other overt s/s of aspiration.   She verbalized quietly today; low volume persists. She did mouthe several words in unison with SLP - counted from 1-5 and produced Sunday-Thursday with verbal model.  Max assist needed to identify numbers/letters from communication board. She made frequent facial expressions (smiling/frowning) that were appropriate to social context.  PMV removed at daughter's request and bed returned to original position (36 degrees) ensuring pt's comfort.  Charlene [name] will benefit from repeat MBS in the near future to determine potential to liberate choices of POs.   HPI HPI: Pt is 55 y/o female presents to [facility] on 2/9 with L sided weakness and headaches. PMH includes anxiety, HTN. 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 S/p Right frontal ventricular catheter placement on 2/10, stopped functioning on 2/11.  MRI 2/11 shows right greater than left mesial temporal lobes / parahippocampal cortex, bilateral basal ganglia, and possibly the upper pons that are concerning for acute infarcts, most likely secondary to mass effect from the hemorrhage. ETT 2/10; trach/PEG 09/10/20.  Trach  changed to #6 cuffless 3/11; #4 cuffless 5/4. Hospital course complicated by persistent fever,  Serratia pneumonia and pre-orbital cellulitis.      SLP Plan  Continue with current plan of care       Recommendations  Diet recommendations: NPO Medication Administration: Via alternative means Supervision: Trained caregiver to feed patient      Patient may use Passy-Muir Speech Valve: During all waking hours (remove during sleep) PMSV Supervision: Intermittent         Oral Care Recommendations: Oral care QID;Staff/trained caregiver to provide oral care Follow up Recommendations: Skilled Nursing facility SLP Visit Diagnosis: Cognitive communication deficit (K74.259) Plan: Continue with current plan of care       GO              Charlene Arnette L. Samson [name], MA CCC/SLP Acute Rehabilitation Services Office number [phone] Pager [phone]   Charlene [name] 01/20/2021, 1:53 PM

## 2021-01-20 NOTE — Progress Notes (Signed)
   NAME:  Charlene [name], MRN:  [id number], DOB:  [birth year], LOS: 146 ADMISSION DATE:  08/26/2020, CONSULTATION DATE:  2/10 REFERRING MD:  Roda Shutters, CHIEF COMPLAINT:  headache   History of Present Illness:  55 year old female admitted with hypertensive R thalamic/basal ganglia ICH. Resultant obstructive hydrocephalus prompting EVD placement 2/10. Subsequently, 2/10 she had acute mental status change to obtundation and respiratory arrest resulting in intubation. Trach and Peg 2/24. Trach change to #6 cuffless on 3/11.   Pertinent  Medical History  Hypertension, Medication non-compliance  Significant Hospital Events: Including procedures, antibiotic start and stop dates in addition to other pertinent events   2/09 Admit for ICH 2/10 EVD placed, later intubated due to obtundation and respiratory arrest 2/11 MRI Brain, hypertonic saline d/c'd, EVD not working.  Husband did not consent to PICC  2/13 EVD removed 2/17 GOC discussion with family, PMT 2/24 tracheostomy placed by Dr. Denese Killings, PEG placed by Dr. Sheliah Hatch 3/11 trach changed to Sh[place] 6 uncuffed 3/21 sputum culture with Serratia resistant to Ancef 4/18 28% ATC / 5L  5/08 downsized trach to 4  Interim History / Subjective:  Non verbal.  Follows simple commands.  Objective   Blood pressure 127/70, pulse 96, temperature 98.9 F (37.2 C), temperature source Oral, resp. rate 17, height 5\' 5"  (1.651 m), weight 61.6 kg, last menstrual period 03/03/2014, SpO2 99 %, unknown if currently breastfeeding.    FiO2 (%):  [21 %] 21 %   Intake/Output Summary (Last 24 hours) at 01/20/2021 0919 Last data filed at 01/19/2021 2341 Gross per 24 hour  Intake --  Output 655 ml  Net -655 ml   Filed Weights   01/16/21 0500 01/19/21 0500 01/20/21 0500  Weight: 59.3 kg 62.7 kg 61.6 kg    Examination:  General - alert Eyes - pupils reactive ENT - trach site clean Cardiac - regular rate/rhythm, no murmur Chest - equal breath sounds b/l, no wheezing or  rales Abdomen - soft, non tender, + bowel sounds Extremities - no cyanosis, clubbing, or edema Skin - no rashes Neuro - right gaze preference, moves right extremities  Resolved Hospital Problem list     Assessment & Plan:   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 - continue trach care - goal SpO2 > 92% - speech therapy working with PM valve and swallowing - not candidate for decannulation at this time due to poor cough mechanics and deconditioning  PCCM will follow up again in week of 01/25/21 - call if assistance needed sooner  Signature:  03/28/21, MD [facility] Pulmonary/Critical Care Pager - [phone] 01/20/2021, 9:28 AM

## 2021-01-20 NOTE — Progress Notes (Signed)
Nutrition Follow-up  DOCUMENTATION CODES:  Not applicable  INTERVENTION:  Continue Tube feeding via PEG tube: -Osmolite 1.2 at 60 ml/h (1440 ml per day) -45 ml ProSource TF daily   Provides 1768 kcal, 90 gm protein, 1180 ml free water daily   -200 ml free water every 4 hours    NUTRITION DIAGNOSIS:  Inadequate oral intake related to inability to eat as evidenced by NPO status. 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  Subsequently on 2/10, pt developed obtundation and respiratory arrest leading to intubation. Pt is now s/p trach/PEG placement. Hospital course complicated by Serratia pneumonia, ESBL Enterobacter UTI and pre-orbital cellulitis.   2/10 s/p EVD placement and intubation for respiratory arrest 2/13 EVD removed 2/17 palliative care consult; family requesting tx to [facility]  2/24 s/p trach and PEG  3/1 tx to [facility] 3/11 trach changed to [place] #6 cuffless 4/08 pt w/ sepsis 2/2 UTI 4/18 28% ATC/5L 5/2 CIR reevaluated pt and determined she has not made enough improvement for admit; recommended SNF 5/8 trach changed to shiley #4 cuffless  5/23 CCM evaluated and determined pt stilll unable to decannulate due to variable mental status 6/3 trach exchanged, still shiley #4 cuffless 6/30 s/p R knee aspiration and injection 7/3 trach exchanged, still shiley #4 cuffless  Pt nonverbal but is able to follow simple commands. Per CCM, pt is still not a candidate for decannulation due to poor cough mechanics and deconditioning. SLP continues to work with pt on PMV and swallowing. Per RN, pt continues to tolerate TF  regimen via PEG: Osmolite 1.2 at 60 ml/h w/ 45 ml ProSource TF daily, 235m free water flushes Q4H.   UOP: 6543m+ 1x unmeasured occurrence x24 hours Stool: 3x unmeasured occurrences x24 hours  Admit weight: 61.2 kg  Current weight: 61.6 kg  Medications: Scheduled Meds:  acetaminophen  650 mg Per Tube Q6H   amLODipine  10 mg Per Tube Daily   atorvastatin  40 mg Per Tube Daily   baclofen  10 mg Per Tube 3 times per day   celecoxib  200 mg Per Tube BID   chlorhexidine gluconate (MEDLINE KIT)  15 mL Mouth Rinse BID   Chlorhexidine Gluconate Cloth  6 each Topical Daily   diclofenac Sodium  4 g Topical QID   doxazosin  2 mg Per Tube Daily   feeding supplement (PROSource TF)  45 mL Per Tube Daily   fiber  1 packet Per Tube BID   free water  200 mL Per Tube Q4H   hydrALAZINE  37.5 mg Per Tube Q8H   insulin aspart  0-9 Units Subcutaneous Q4H   magic mouthwash w/lidocaine  10 mL Oral QID   mouth rinse  15 mL Mouth Rinse 5 X Daily   metoprolol tartrate  100 mg Per Tube BID   modafinil  200 mg Per Tube Daily   pantoprazole sodium  40 mg Per Tube Daily   sulfamethoxazole-trimethoprim  20 mL Per Tube Q12H   tiZANidine  4 mg Per Tube QHS   traMADol  25 mg Per Tube Q6H  Continuous Infusions:  sodium chloride Stopped (10/26/20 1445)   feeding  supplement (OSMOLITE 1.2 CAL) 1,000 mL (01/19/21 0806)   Labs reviewed.  CBGs 100-127-84  Diet Order:   Diet Order             Diet NPO time specified  Diet effective now                  EDUCATION NEEDS:  No education needs have been identified at this time  Skin:  Skin Assessment: Reviewed RN Assessment  Last BM:  7/6  Height:  Ht Readings from Last 1 Encounters:  10/18/20 _0  (1.651 m)   Weight:  Wt Readings from Last 1 Encounters:  01/20/21 61.6 kg   Ideal Body Weight:  56.8 kg  BMI:  Body mass index is 22.6 kg/m.  Estimated Nutritional Needs:  Kcal:  1600-1800 Protein:  80-90 grams Fluid:  >1.6L/d    Larkin Ina, MS, RD, LDN RD pager number and weekend/on-call pager number located in [place].

## 2021-01-20 NOTE — Progress Notes (Signed)
Pharmacy Antibiotic Note  Charlene [name] is a 55 y.o. female admitted 08/26/20, awaiting transfer to trach-capable SNF. Pharmacy has been consulted for Meropenem dosing for ESBL Klebsiella UTI.  Last antibiotic course ended 12/25/20. Received Meropenem 6/3 and 6/4 x 1 > then 7-day course of Ceftriaxone for Klebsiella UTI (not ESBL).     Has had 2 days of Septa > changing to Meropenem due to fever and lethargy. Noted plan to consult ID.   Plan:  Meropenem 1 gm IV q8h.  Will follow up for ID input.  Height: 5\' 5"  (165.1 cm) Weight: 61.6 kg (135 lb 12.9 oz) IBW/kg (Calculated) : 57  Temp (24hrs), Avg:98.2 F (36.8 C), Min:97.8 F (36.6 C), Max:98.9 F (37.2 C)  Recent Labs  Lab 01/16/21 0702  WBC 6.0  CREATININE 0.50    Estimated Creatinine Clearance: 73.2 mL/min (by C-G formula based on SCr of 0.5 mg/dL).    No Known Allergies  Recent antimicrobials this admission:  Septra per tube 7/4 >>7/6  Meropenem 7/6 >>   Meropenem 6/3>>6/4  Ceftriaxone 6/4>>6/10  Microbiology results:  7/4 UTI > 100K/ml ESBL Klebsiella - R to amp, cefazolin, nitorfurantoin, sensitive to ceftriaxone, cefepime, Cipro, imipenem, Septra, Unasyn, Zosyn  7/4 blood: no growth x 4 days to date  7/4 COVID: negative   12/17/20 urine: 50K/ml Klebsiella - R to ampicillin and nitrofurantoin, Intermediate to cefazolin, sensitive to ceftriaxone, cefepime, Cipro, gent (MIC<1), imipenem, Septra, Unasyn, Zosyn  Thank you for allowing pharmacy to be a part of this patient's care.  02/16/21, Dennie Fetters 01/20/2021 8:55 AM

## 2021-01-20 NOTE — Progress Notes (Signed)
[facility] PROGRESS NOTE  Charlene [name] JTT:[id number] DOB: May 05, 1966 DOA: 08/26/2020 PCP: Patient, [name] (Inactive)    October 08, 2020     10/16/2020                        10/16/2020     10/18/2020                        10/20/2020      12/30/2020                   12/30/2020   01/04/2021   Status: Remains inpatient appropriate because:Altered mental status and Unsafe d/c plan: Requirement to discharge to trach capable SNF   Dispo: The patient is from: Home              Anticipated d/c is to: SNF trach capable-[place]-we have recently discovered that the [facility] may have a trach bed available in clinicals have been sent to the facility for review -trach team re eval 6/3 and still rec NOT to decannulate 2/2 and lack of appropriate voluntary and involuntary cough response              Patient currently is medically stable to d/c.   Difficult to place patient Yes  **7/5: SW spoke directly with patient's Medicaid coordinator who confirmed that they do not need any additional information from the hospital regarding her application.  Unable to proceed any further until disability approved.  **Husband at bedside-updated on recent med changes and recurrent UTI. He had questions about contacting Disability-CM provided number  Level of care: Telemetry Medical  Code Status: Full Family Communication: husband Charlene [name] [phone]) is her POA and legal contact.  Mother at bedside 7/4 DVT prophylaxis: Chronically bedbound.  5/6: We will discontinue prophylactic Lovenox   Vaccination status: Unvaccinated-husband still undecided.  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  Subsequently on 2/10, she developed obtundation and respiratory arrest leading to intubation.  She has remained persistently obtunded, now has a trach and a PEG.  Hospital course complicated by persistent fever, Serratia pneumonia and now pre-orbital cellulitis  As of 3/25 with adjustments in hypertonicity medications patient began to improve regarding hypertonicity.  Eventually started on provigil with improvements in alertness.  Evaluated by rehab physician who recommended increasing provigil dose further.  Subsequently patient has become more alert.  Most days she is able to track and with encouragement able to follow simple commands especially since partial obstruction glasses placed.  See progress note dictated 4/27 for expanded details.   2/09 Admit for [facility] 2/10 EVD placed, later intubated due to obtundation and respiratory arrest 2/11 MRI Brain, hypertonic saline d/c'd, EVD not working.  Husband did not consent to PICC  2/13 EVD removed 2/17 [place] discussion with family, PMT 2/24 Bedside trach and PEG 3/1 Transferred to [facility]  Subjective: Alert.  Tracking  in room and at times attempts to phonate but unable to do this audibly.  She did grimace with attempts to straighten right lower extremity.  Objective: Vitals:   01/20/21 0317 01/20/21 0347  BP:  133/74  Pulse: 76 81  Resp: 11 14  Temp:  98.4 F (36.9 C)  SpO2: 100% 100%    Intake/Output Summary (Last 24 hours) at 01/20/2021 0743 Last data filed at 01/19/2021 2341 Gross per 24 hour  Intake --  Output 655 ml  Net -655 ml     Filed Weights   01/16/21 0500 01/19/21 0500 01/20/21 0500  Weight: 59.3 kg 62.7 kg 61.6 kg    Exam:       Gen: Overall appears  comfortable and only grimaced with attempt to straighten RLE Respiratory: #4.0 cuffless trach, trach collar with FiO2 21% at 5L, and clear, no increased work of breathing, no definitive voluntary cough Cardiovascular: Normal heart sounds, regular pulse, normotensive, no peripheral edema Abdomen:  PEG for tube feeding-LBM 7/46  Soft without any palpable tenderness.  Normoactive bowel sounds Musculoskeletal: Continues to keep both legs flexed at the knee with notable pain in RLE with attempts to extend right knee.  No pain to palpation over right knee Neurologic: Left facial droop, right upper extremity strength 4/5 with ability to point.  Left upper extremity strength 1/5.  Right lower extremity strength difficult to test given patient's propensity to keep knees flexed secondary to knee pain.  Left lower extremity strength about 1-2/5 Psychiatric: Awake, tracks appropriately, attempts to phonate but given inability to consistently understand her attempts orientation is unable to be accurately assessed   Assessment/Plan: Acute problems: Multiple acute strokes  Right Thalamus ICH with IVH  Obstructive Hydrocephalus/Acute on persistent metabolic Encephalopathy/Non TBI related hypertonicity -Continue baclofen and tizanidine for spasticity. Continue Provigil and therapies -Reevaluated by CIR (5/2 ) with recommendation for SNF -Her brain injury involves corona radiata, midbrain with associated midline shift and right basal ganglia.  These areas correlate with awareness and purposeful movement -Continues to work well and consistently with therapies.    Tracheostomy dependent -Primary barrier to decannulation is poor to at times absent cough reflex.  Inconsistent provoked cough effort and when she does cough very weak and basically ineffective -6/16 [name] d/w her husband..She documented that recovery would be very slow and at best she is weeks to months away from safe decannulation. Her very slow  recovery trajectory is concerning for requirement for permanent trach/no decannulation. -7/6 reevaluated again by pulmonary medicine with recommendations to not decannulate due to safety concern 6/20 reevaluated by trach team -not yet a candidate for capping 6/30 SLP considering low-level respiratory muscle strength training as patient mentation allows to improve candidacy for future decannulation.  ??Recurrent Klebsiella now ESBL + UTI 100,000 colonies of ESBL positive Klebsiella Patient has had multiple UTIs since admission ID consulted.  Given lack of abdominal discomfort on exam and only 1 solitary T spike it was felt current urinalysis and culture not representative of acute infection therefore recommendation was to continue a short course of Septra and discontinue recently ordered meropenem  Rhythmic activity of right side EEG negative for seizure activity  Colonic Ileus/recurrent constipation/loose stools Constipation resolved-last bowel movement 7/4  Acute dental pain -CT maxillofacial without evidence of overt infection or abscess -Continue Orajel QID and Magic mouthwash with lidocaine for symptom  Recurrent right knee pain/known ligamentous injury and chronic osteoarthritis -s/p dry tap with injection of Marcaine and Depomedrol 6/30 -Increase prn Oxy elixir  to 5 mg prn for more severe pain -Recent CT imaging without acute findings and current exam without evidence of effusion -7/5: Added Celebrex BID w/ PPI given reemergence of pain and desire to minimize narcotic pain medication 7/6 no pain reproducible with palpation of the right knee  Hypertensive emergency/Malignant HTN -Continue Norvasc and hydralazine. -Attending increased beta-blocker to 100 mg twice daily on 7/6  Dysphagia -Continue tube feedings, Prosource liquid, free water -Mother has been trained by SLP to administer orals to patient  History of urinary retention -Continue Cardura      Other  problems: Right upper extremity thrombophlebitis - [name] -> age indeterminate SVT of right cephalic vein  Neck Contracture/torticollis -- Resolved  ESBL positive Enterobacter UTI -Has completed 5 days of meropenem -Contact isolation during hospitalization -continue Oxybutynin suspected bladder spasms  Klebsiella UTI -Resistant to nitrofurantoin and ampicillin -Meropenem transitioned to Rocephin on 5/22 -To minimize risk of recurrent UTI have asked that pure wick only be used at bedtime on a 10 PM to 8 AM schedule  Recurrent diarrhea -Resolved -Likely combination of recent antibiotics as well as scheduled laxative -MiraLAX now as needed  Bacterial Conjunctivitis /periorbital cellulitis -Resolved  Chronic pansinusitis/recurrent fevers 2/2 aspiration PNA;History of Serratia Pneumonia  Hospital Acquired Pneumonia:  -Completed several courses of antibiotic -Follow-up imaging including sinus CT consistent with chronic but stable and resolving sinusitis   Anemia -Stable  HLD -Continue Lipitor      Data Reviewed: Basic Metabolic Panel: Recent Labs  Lab 01/16/21 0702  NA 138  K 3.7  CL 99  CO2 31  GLUCOSE 129*  BUN 13  CREATININE 0.50  CALCIUM 9.4    Liver Function Tests: Recent Labs  Lab 01/16/21 0702  AST 25  ALT 40  ALKPHOS 94  BILITOT 0.6  PROT 6.8  ALBUMIN 3.3*    CBC: Recent Labs  Lab 01/16/21 0702  WBC 6.0  NEUTROABS 3.3  HGB 10.6*  HCT 33.8*  MCV 78.8*  PLT 306     CBG: Recent Labs  Lab 01/19/21 0834 01/19/21 1144 01/19/21 1627 01/19/21 2249 01/19/21 2339  GLUCAP 106* 129* 93 130* 128*     Studies: EEG adult  Result Date: 05-Feb-2021 Lora Havens, MD     05-Feb-2021  5:20 PM Patient Name: Charlene [name] MRN: [id number] Epilepsy Attending: Lora Havens Referring Physician/Provider: Erin Hearing, NP Date: 05-Feb-2021 Duration: 22.34 mins Patient history: 55yo F with R thalamic/BG ICH with worsening mentation.  Her mother  noticed some rhythmic activity involving her right foot and over the weekend her husband noted the same involving the right upper extremity with associated recent heart rate and increased anxiety levels. EEG to evaluate for seizure. Level of alertness: Awake AEDs during EEG study: None Technical aspects: This EEG study was done with scalp electrodes positioned according to the 10-20 International system of electrode placement. Electrical activity was acquired at a sampling rate of '500Hz'  and reviewed with a high frequency filter of '70Hz'  and a low frequency filter of '1Hz' . EEG data were recorded continuously and digitally stored. Description: The posterior dominant rhythm consists of 8-9 Hz activity of moderate voltage (25-35 uV) seen predominantly in posterior head regions, symmetric and reactive to eye opening and eye closing. EEG showed intermittent generalized and maximal right centro-temporal region 3 to 6 Hz theta-delta slowing. Hyperventilation and photic stimulation were not performed.   ABNORMALITY - Intermittent slow, generalized and maximal right centro-temporal region IMPRESSION: This study is suggestive of ortical dysfunction arising from  right centro-temporal region, nonspecific etiology but likely secondary to underlying bleed. Additionally, there is mild diffuse encephalopathy, nonspecific etiology. No seizures or epileptiform discharges were seen throughout the recording. Charlene [name]     Scheduled Meds:  acetaminophen  650 mg Per Tube Q6H   amLODipine  10 mg Per Tube Daily   atorvastatin  40 mg Per Tube Daily   baclofen  10 mg Per Tube 3 times per day   celecoxib  200 mg Per Tube BID   chlorhexidine gluconate (MEDLINE KIT)  15 mL Mouth Rinse BID   Chlorhexidine Gluconate Cloth  6 each Topical Daily   diclofenac Sodium  4 g Topical QID   doxazosin  2 mg Per Tube Daily   feeding supplement (PROSource TF)  45 mL Per Tube Daily   fiber  1 packet Per Tube BID   free water  200 mL Per  Tube Q4H   hydrALAZINE  37.5 mg Per Tube Q8H   insulin aspart  0-9 Units Subcutaneous Q4H   magic mouthwash w/lidocaine  10 mL Oral QID   mouth rinse  15 mL Mouth Rinse 5 X Daily   metoprolol tartrate  100 mg Per Tube BID   modafinil  200 mg Per Tube Daily   pantoprazole sodium  40 mg Per Tube Daily   tiZANidine  4 mg Per Tube QHS   traMADol  25 mg Per Tube Q6H   Continuous Infusions:  sodium chloride Stopped (10/26/20 1445)   feeding supplement (OSMOLITE 1.2 CAL) 1,000 mL (01/19/21 0806)    Principal Problem:   ICH (intracerebral hemorrhage) (HCC) Active Problems:   Acute hypoxemic respiratory failure (HCC)   Tracheostomy dependent ([place])   Palliative care by specialist   Muscle hypertonicity   Oropharyngeal dysphagia   Poor prognosis   Aspiration pneumonia of right lower lobe (HCC)   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  Palliative care.   PCCM continues to follow.  No decannulation.   Procedures: Tracheostomy PEG tube EEG Echocardiogram  Antibiotics: Vancomycin 2/12-2/15 Maxipime 2/12-2/15 Ancef 2/19-2/27 Vancomycin 2/20 x 1 dose Maxipime 2/27- 3/5 Vancomycin 3/1-3/3 Ancef 3/6-3/14 Cipro 3/20 4-3/30 Unasyn 3/30-4/4 Rocephin 4/11-4/12 Meropenem 4/12-4/17   Time spent: 25 minutes    Erin Hearing ANP  [facility] 7 am - 330 pm/M-F for direct patient care and secure chat Please refer to [place] for contact info 146  Days

## 2021-01-20 NOTE — Consult Note (Signed)
[facility]                                                                                        Patient Identification: Patient Name: Charlene [name] MRN: [id number] [place] Date: 08/26/2020  9:58 PM Today's Date: 01/20/2021 Reason for consult: recurrent UTI Requesting provider: Domenic Polite   Principal Problem:   ICH (intracerebral hemorrhage) ([place]) Active Problems:   Acute hypoxemic respiratory failure ([place])   Tracheostomy dependent ([place])   Palliative care by specialist   Muscle hypertonicity   Oropharyngeal dysphagia   Poor prognosis   Aspiration pneumonia of right lower lobe ([place])   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 Meropenem 5/20-5/22, ceftriaxone 5/22-5/26 4/8 urine cx Enterobacter cloacae ceftriaxone 4/11, meropenem 4/12-4/16 10/05/20 trach aspirate cx serratia marcescens Ciprofloxacin 3/24-3/30, unasyn 3/30-4/4 3/1 trach aspirate cx serratia marcescens cefepime 2/27-3/5, vancomycin 3/1-3/2 2/15 Trach aspirate cx serratia marcescens   Lines/Tubes: PEG tube, tracheostomy, PIVs   Assessment Fevers: only one episode of fevers on 7/1. No elevated WBC count. Blood cx 7/2 No growth. Chest xray and abominal Xray. No diarrhea or decubitus ulcers per RN   Concern for UTI  No suprapubic tenderness on exam. UA is unremarkable for UTI. Urine cx ESBL Kleb pneumoniae ( possibly colonized)  Rt thalamic ICH/Hydrocephalus  S/o tracheostomy  Comments : Doubt this is a true UTI given findings of UA and only one episode of fever which can be  multifactorial in a chronically sick and bedridden patient with h/o ICH  Recommendations Can stop bactrim from tomorrow after completing 3 days course for cystitis.  Monitor off antibiotics  ID will sign off for now. Please call with questions.   Rest of the management as per the primary team. Please call with questions or concerns.  Thank you for the consult  Rosiland Oz, MD Infectious Disease Physician [facility] [address] 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  She remained persistently obtunded, EVD removed 2/13.  She had a bedside trach and PEG placement on 2/24 and transferred to [facility] on 3/1.  Hospital course complicated by Serratia pneumonia, left preorbital cellulitis ( cefazolin 3/7-3/13) , sinusitis ( cefazolin 2/19-,2/26)  multiple UTIs s/p treatment with  above antibiotics   Patient had one episode of fevers 101.8 on 7/1 after which she was pancultured. UA unremarkable, urine cx with ESBL Kleb pneumoniae, blood cx no growth. Chest xray and abdominal Xray with no acute findings.   ROS: unavailable as patient is intubated   Past Medical History:  Diagnosis Date   Hypertension    Past Surgical History:  Procedure Laterality Date   CESAREAN SECTION     ESOPHAGOGASTRODUODENOSCOPY N/A 09/10/2020   Procedure: ESOPHAGOGASTRODUODENOSCOPY (EGD);  Surgeon: Kieth Brightly, Arta Bruce, MD;  Location: [facility] ENDOSCOPY;  Service: General;  Laterality: N/A;   PEG PLACEMENT N/A 09/10/2020   Procedure: PERCUTANEOUS ENDOSCOPIC GASTROSTOMY (PEG) PLACEMENT;   Surgeon: Mickeal Skinner, MD;  Location: [place];  Service: General;  Laterality: N/A;     Scheduled Meds:  acetaminophen  650 mg Per Tube Q6H   amLODipine  10 mg Per Tube Daily   atorvastatin  40 mg Per Tube Daily   baclofen  10 mg Per Tube 3 times per day   celecoxib  200 mg Per Tube BID   chlorhexidine gluconate (MEDLINE KIT)  15 mL Mouth Rinse BID   Chlorhexidine Gluconate Cloth  6 each Topical Daily   diclofenac Sodium  4 g Topical QID   doxazosin  2 mg Per Tube Daily   feeding supplement (PROSource TF)  45 mL Per Tube Daily   fiber  1 packet Per Tube BID   free water  200 mL Per Tube Q4H   hydrALAZINE  37.5 mg Per Tube Q8H   insulin aspart  0-9 Units Subcutaneous Q4H   magic mouthwash w/lidocaine  10 mL Oral QID   mouth rinse  15 mL Mouth Rinse 5 X Daily   metoprolol tartrate  100 mg Per Tube BID   modafinil  200 mg Per Tube Daily   pantoprazole sodium  40 mg Per Tube Daily   tiZANidine  4 mg Per Tube QHS   traMADol  25 mg Per Tube Q6H   Continuous Infusions:  sodium chloride Stopped (10/26/20 1445)   feeding supplement (OSMOLITE 1.2 CAL) 1,000 mL (01/19/21 0806)   PRN Meds:.sodium chloride, benzocaine, labetalol, oxyCODONE, polyethylene glycol  No Known Allergies  Social History   Socioeconomic History   Marital status: Single    Spouse name: Not on file   Number of children: Not on file   Years of education: Not on file   Highest education level: Not on file  Occupational History   Not on file  Tobacco Use   Smoking status: Never   Smokeless tobacco: Never  Substance and Sexual Activity   Alcohol use: No   Drug use: No   Sexual activity: Yes    Birth control/protection: None  Other Topics Concern   Not on file  Social History Narrative   Not on file   Social Determinants of Health   Financial Resource Strain: Not on file  Food Insecurity: Not on file  Transportation Needs: Not on file  Physical Activity: Not on file  Stress: Not on  file  Social Connections: Not on file  Intimate Partner Violence: Not on file    Vitals BP 127/70 (BP Location: Right Arm)   Pulse 96   Temp 98.9 F (37.2 C) (Oral)   Resp 17   Ht _0  (1.651 m)   Wt 61.6 kg   LMP 03/03/2014   SpO2 99%   BMI 22.60 kg/m    Physical Exam Constitutional:  Chronically sick looking female lying in bed., spontaneously opens eyes     Comments: tracheostomy tube + Fio2 21%  Cardiovascular:     Rate and Rhythm: Normal rate and regular rhythm.     Heart sounds:   Pulmonary:     Effort:    Comments: anterior lung sounds mostly clear  Abdominal:     Palpations: Abdomen is soft.     Tenderness: PEG tube + - looks clean   Musculoskeletal:        General: contracted Lower extremities, both knees are in flexed position   Skin:    Comments: No obvious lesions or rashes   Neurological:     General:unable to assess  Psychiatric:        Mood and Affect: non verbal    Pertinent Microbiology Results for orders placed or performed during the hospital encounter of 08/26/20  Resp Panel by RT-PCR (Flu A&B, Covid) Nasopharyngeal Swab     Status: None   Collection Time: 08/26/20 10:33 PM   Specimen: Nasopharyngeal Swab; Nasopharyngeal(NP) swabs in vial transport medium  Result Value Ref Range Status   SARS Coronavirus 2 by RT PCR NEGATIVE NEGATIVE Final    Comment: (NOTE) SARS-CoV-2 target nucleic acids are NOT DETECTED.  The SARS-CoV-2 RNA is generally detectable in upper respiratory specimens during the acute phase of infection. The lowest concentration of SARS-CoV-2 viral copies this assay can detect is 138 copies/mL. A negative result does not preclude SARS-Cov-2 infection and should not be used as the sole basis for treatment or other patient management decisions. A negative result may occur with  improper specimen collection/handling, submission of specimen other than nasopharyngeal swab, presence of viral mutation(s) within the areas  targeted by this assay, and inadequate number of viral copies(<138 copies/mL). A negative result must be combined with clinical observations, patient history, and epidemiological information. The expected result is Negative.  Fact Sheet for Patients:  EntrepreneurPulse.com.au  Fact Sheet for Healthcare Providers:  IncredibleEmployment.be  This test is no t yet approved or cleared by the [place] FDA and  has been authorized for detection and/or diagnosis of SARS-CoV-2 by FDA under an Emergency Use Authorization (EUA). This EUA will remain  in effect (meaning this test can be used) for the duration of the COVID-19 declaration under Section 564(b)(1) of the Act, 21 U.S.C.section 360bbb-3(b)(1), unless the authorization is terminated  or revoked sooner.       Influenza A by PCR NEGATIVE NEGATIVE Final   Influenza B by PCR NEGATIVE NEGATIVE Final    Comment: (NOTE) The Xpert Xpress SARS-CoV-2/FLU/RSV plus assay is intended as an aid in the diagnosis of influenza from Nasopharyngeal swab specimens and should not be used as a sole basis for treatment. Nasal washings and aspirates are unacceptable for Xpert Xpress SARS-CoV-2/FLU/RSV testing.  Fact Sheet for Patients: EntrepreneurPulse.com.au  Fact Sheet for Healthcare Providers: IncredibleEmployment.be  This test is not yet approved or cleared by the [place] FDA and has been authorized for detection and/or diagnosis of SARS-CoV-2 by FDA under an Emergency Use Authorization (EUA). This EUA will remain in effect (meaning this test can be used) for the duration of the COVID-19 declaration under Section 564(b)(1) of the Act, 21 U.S.C. section 360bbb-3(b)(1), unless the authorization is terminated or revoked.  Performed at [facility], [address] [place], [place] [address]   MRSA PCR Screening     Status: None   Collection Time:  08/27/20  2:34 AM   Specimen: Nasopharyngeal  Result Value Ref Range Status   MRSA by PCR NEGATIVE NEGATIVE Final    Comment:        The GeneXpert MRSA Assay (FDA approved for NASAL specimens only), is one component of a comprehensive MRSA colonization surveillance program. It is not intended to diagnose MRSA infection nor to guide or monitor  treatment for MRSA infections. Performed at [facility], [place] [address] [place], [place] [address]   Culture, blood (routine x 2)     Status: None   Collection Time: 08/29/20  4:17 PM   Specimen: BLOOD LEFT HAND  Result Value Ref Range Status   Specimen Description BLOOD LEFT HAND  Final   Special Requests   Final    BOTTLES DRAWN AEROBIC AND ANAEROBIC Blood Culture results may not be optimal due to an excessive volume of blood received in culture bottles   Culture   Final    NO GROWTH 5 DAYS Performed at [facility], [place] [address] [place], [place] [address]    Report Status 09/03/2020 FINAL  Final  Culture, blood (routine x 2)     Status: None   Collection Time: 08/29/20  4:23 PM   Specimen: BLOOD RIGHT HAND  Result Value Ref Range Status   Specimen Description BLOOD RIGHT HAND  Final   Special Requests   Final    BOTTLES DRAWN AEROBIC AND ANAEROBIC Blood Culture results may not be optimal due to an excessive volume of blood received in culture bottles   Culture   Final    NO GROWTH 5 DAYS Performed at [facility], [place] [address], [place], [place] [address]    Report Status 09/03/2020 FINAL  Final  Culture, blood (Routine X 2) w Reflex to ID Panel     Status: None   Collection Time: 09/05/20  4:57 AM   Specimen: BLOOD RIGHT HAND  Result Value Ref Range Status   Specimen Description BLOOD RIGHT HAND  Final   Special Requests AEROBIC BOTTLE ONLY Blood Culture adequate volume  Final   Culture   Final    NO GROWTH 5 DAYS Performed at [facility], [place] [address], [place], [place] [address]    Report  Status 09/10/2020 FINAL  Final  Culture, blood (Routine X 2) w Reflex to ID Panel     Status: Abnormal   Collection Time: 09/05/20  4:57 AM   Specimen: BLOOD  Result Value Ref Range Status   Specimen Description BLOOD RIGHT THUMB  Final   Special Requests AEROBIC BOTTLE ONLY Blood Culture adequate volume  Final   Culture  Setup Time   Final    AEROBIC BOTTLE ONLY GRAM POSITIVE COCCI CRITICAL RESULT CALLED TO, READ BACK BY AND VERIFIED WITH:    Culture (A)  Final    STAPHYLOCOCCUS EPIDERMIDIS STAPHYLOCOCCUS HOMINIS THE SIGNIFICANCE OF ISOLATING THIS ORGANISM FROM A SINGLE SET OF BLOOD CULTURES WHEN MULTIPLE SETS ARE DRAWN IS UNCERTAIN. PLEASE NOTIFY THE MICROBIOLOGY DEPARTMENT WITHIN ONE WEEK IF SPECIATION AND SENSITIVITIES ARE REQUIRED. Performed at [facility], [place] [address] [place], [place] [address]    Report Status 09/07/2020 FINAL  Final  Blood Culture ID Panel (Reflexed)     Status: Abnormal   Collection Time: 09/05/20  4:57 AM  Result Value Ref Range Status   Enterococcus faecalis NOT DETECTED NOT DETECTED Final   Enterococcus Faecium NOT DETECTED NOT DETECTED Final   Listeria monocytogenes NOT DETECTED NOT DETECTED Final   Staphylococcus species DETECTED (A) NOT DETECTED Final    Comment: CRITICAL RESULT CALLED TO, READ BACK BY AND VERIFIED WITH: PHARMD AMENDE CAREN BY MESSAN H. AT 0520 ON 09/06/2020    Staphylococcus aureus (BCID) NOT DETECTED NOT DETECTED Final   Staphylococcus epidermidis DETECTED (A) NOT DETECTED Final    Comment: Methicillin (oxacillin) resistant coagulase  negative staphylococcus. Possible blood culture contaminant (unless isolated from more than one blood culture draw or clinical case suggests pathogenicity). No antibiotic treatment is indicated for blood  culture contaminants. CRITICAL RESULT CALLED TO, READ BACK BY AND VERIFIED WITH: PHARMD AMENDE CAREN BY MESSAN H. AT 6440 ON 09/06/2020    Staphylococcus lugdunensis NOT DETECTED NOT  DETECTED Final   Streptococcus species NOT DETECTED NOT DETECTED Final   Streptococcus agalactiae NOT DETECTED NOT DETECTED Final   Streptococcus pneumoniae NOT DETECTED NOT DETECTED Final   Streptococcus pyogenes NOT DETECTED NOT DETECTED Final   A.calcoaceticus-baumannii NOT DETECTED NOT DETECTED Final   Bacteroides fragilis NOT DETECTED NOT DETECTED Final   Enterobacterales NOT DETECTED NOT DETECTED Final   Enterobacter cloacae complex NOT DETECTED NOT DETECTED Final   Escherichia coli NOT DETECTED NOT DETECTED Final   Klebsiella aerogenes NOT DETECTED NOT DETECTED Final   Klebsiella oxytoca NOT DETECTED NOT DETECTED Final   Klebsiella pneumoniae NOT DETECTED NOT DETECTED Final   Proteus species NOT DETECTED NOT DETECTED Final   Salmonella species NOT DETECTED NOT DETECTED Final   Serratia marcescens NOT DETECTED NOT DETECTED Final   Haemophilus influenzae NOT DETECTED NOT DETECTED Final   Neisseria meningitidis NOT DETECTED NOT DETECTED Final   Pseudomonas aeruginosa NOT DETECTED NOT DETECTED Final   Stenotrophomonas maltophilia NOT DETECTED NOT DETECTED Final   Candida albicans NOT DETECTED NOT DETECTED Final   Candida auris NOT DETECTED NOT DETECTED Final   Candida glabrata NOT DETECTED NOT DETECTED Final   Candida krusei NOT DETECTED NOT DETECTED Final   Candida parapsilosis NOT DETECTED NOT DETECTED Final   Candida tropicalis NOT DETECTED NOT DETECTED Final   Cryptococcus neoformans/gattii NOT DETECTED NOT DETECTED Final   Methicillin resistance mecA/C DETECTED (A) NOT DETECTED Final    Comment: CRITICAL RESULT CALLED TO, READ BACK BY AND VERIFIED WITH: PHARMD AMENDE CAREN BY MESSAN H. AT 0520 ON 09/06/2020 Performed at [name], [address] [place], [place] [address]   Culture, blood (routine x 2)     Status: None   Collection Time: 09/11/20  2:15 PM   Specimen: BLOOD LEFT HAND  Result Value Ref Range Status   Specimen Description BLOOD LEFT HAND  Final    Special Requests   Final    BOTTLES DRAWN AEROBIC AND ANAEROBIC Blood Culture adequate volume   Culture   Final    NO GROWTH 5 DAYS Performed at [facility], [place] [address], [place], [place] [address]    Report Status 09/16/2020 FINAL  Final  Culture, blood (routine x 2)     Status: None   Collection Time: 09/11/20  2:25 PM   Specimen: BLOOD LEFT FOREARM  Result Value Ref Range Status   Specimen Description BLOOD LEFT FOREARM  Final   Special Requests   Final    BOTTLES DRAWN AEROBIC AND ANAEROBIC Blood Culture adequate volume   Culture   Final    NO GROWTH 5 DAYS Performed at [facility], [place] [address], [place], [place] [address]    Report Status 09/16/2020 FINAL  Final  Culture, Respiratory w Gram Stain     Status: None   Collection Time: 09/11/20  2:39 PM   Specimen: Tracheal Aspirate; Respiratory  Result Value Ref Range Status   Specimen Description TRACHEAL ASPIRATE  Final   Special Requests Normal  Final   Gram Stain   Final    ABUNDANT WBC PRESENT,BOTH PMN AND MONONUCLEAR MODERATE GRAM NEGATIVE RODS  RARE GRAM VARIABLE ROD Performed at [facility], [place] [address] [place], [place] [address]    Culture ABUNDANT SERRATIA MARCESCENS  Final   Report Status 09/13/2020 FINAL  Final   Organism ID, Bacteria SERRATIA MARCESCENS  Final      Susceptibility   Serratia marcescens - MIC*    CEFAZOLIN >=64 RESISTANT Resistant     CEFEPIME <=0.12 SENSITIVE Sensitive     CEFTAZIDIME <=1 SENSITIVE Sensitive     CEFTRIAXONE <=0.25 SENSITIVE Sensitive     CIPROFLOXACIN <=0.25 SENSITIVE Sensitive     GENTAMICIN <=1 SENSITIVE Sensitive     TRIMETH/SULFA <=20 SENSITIVE Sensitive     * ABUNDANT SERRATIA MARCESCENS  Culture, blood (routine x 2)     Status: None   Collection Time: 09/15/20  8:57 AM   Specimen: BLOOD  Result Value Ref Range Status   Specimen Description BLOOD BLOOD LEFT FOREARM  Final   Special Requests   Final    BOTTLES DRAWN AEROBIC AND ANAEROBIC  Blood Culture adequate volume   Culture   Final    NO GROWTH 5 DAYS Performed at [facility], [address], [place], [place] [address]    Report Status 09/20/2020 FINAL  Final  Culture, blood (routine x 2)     Status: None   Collection Time: 09/15/20  9:03 AM   Specimen: BLOOD LEFT HAND  Result Value Ref Range Status   Specimen Description BLOOD LEFT HAND  Final   Special Requests   Final    BOTTLES DRAWN AEROBIC AND ANAEROBIC Blood Culture adequate volume   Culture   Final    NO GROWTH 5 DAYS Performed at [facility], [place] [address] [place], [place] [address]    Report Status 09/20/2020 FINAL  Final  Culture, Respiratory w Gram Stain     Status: None   Collection Time: 09/15/20 11:28 AM   Specimen: Tracheal Aspirate; Respiratory  Result Value Ref Range Status   Specimen Description TRACHEAL ASPIRATE  Final   Special Requests NONE  Final   Gram Stain   Final    ABUNDANT WBC PRESENT,BOTH PMN AND MONONUCLEAR FEW SQUAMOUS EPITHELIAL CELLS PRESENT ABUNDANT GRAM NEGATIVE RODS FEW GRAM POSITIVE COCCI Performed at [facility], [place] [address], [place], [place] [address]    Culture FEW SERRATIA MARCESCENS  Final   Report Status 09/17/2020 FINAL  Final   Organism ID, Bacteria SERRATIA MARCESCENS  Final      Susceptibility   Serratia marcescens - MIC*    CEFAZOLIN >=64 RESISTANT Resistant     CEFEPIME <=0.12 SENSITIVE Sensitive     CEFTAZIDIME <=1 SENSITIVE Sensitive     CEFTRIAXONE <=0.25 SENSITIVE Sensitive     CIPROFLOXACIN <=0.25 SENSITIVE Sensitive     GENTAMICIN <=1 SENSITIVE Sensitive     TRIMETH/SULFA <=20 SENSITIVE Sensitive     * FEW SERRATIA MARCESCENS  Culture, blood (Routine X 2) w Reflex to ID Panel     Status: None   Collection Time: 10/02/20  7:38 AM   Specimen: BLOOD  Result Value Ref Range Status   Specimen Description BLOOD RIGHT ANTECUBITAL  Final   Special Requests   Final    BOTTLES DRAWN AEROBIC AND ANAEROBIC Blood Culture results  may not be optimal due to an inadequate volume of blood received in culture bottles   Culture   Final    NO GROWTH 5 DAYS Performed at [facility], [place] [address], [place], [place]  [address]    Report Status 10/07/2020 FINAL  Final  Culture, blood (Routine X 2) w Reflex to ID Panel     Status: None   Collection Time: 10/02/20  7:42 AM   Specimen: BLOOD RIGHT HAND  Result Value Ref Range Status   Specimen Description BLOOD RIGHT HAND  Final   Special Requests   Final    BOTTLES DRAWN AEROBIC ONLY Blood Culture adequate volume   Culture   Final    NO GROWTH 5 DAYS Performed at [facility], [place] [address], [place], [place] [address]    Report Status 10/07/2020 FINAL  Final  Culture, Respiratory w Gram Stain     Status: None   Collection Time: 10/05/20  1:12 PM   Specimen: Tracheal Aspirate; Respiratory  Result Value Ref Range Status   Specimen Description TRACHEAL ASPIRATE  Final   Special Requests NONE  Final   Gram Stain   Final    NO WBC SEEN ABUNDANT GRAM POSITIVE COCCI ABUNDANT GRAM NEGATIVE RODS MODERATE GRAM POSITIVE RODS Performed at [facility], [place] [address] [place], [place] [address]    Culture RARE SERRATIA MARCESCENS  Final   Report Status 10/07/2020 FINAL  Final   Organism ID, Bacteria SERRATIA MARCESCENS  Final      Susceptibility   Serratia marcescens - MIC*    CEFAZOLIN >=64 RESISTANT Resistant     CEFEPIME <=0.12 SENSITIVE Sensitive     CEFTAZIDIME <=1 SENSITIVE Sensitive     CEFTRIAXONE <=0.25 SENSITIVE Sensitive     CIPROFLOXACIN <=0.25 SENSITIVE Sensitive     GENTAMICIN <=1 SENSITIVE Sensitive     TRIMETH/SULFA <=20 SENSITIVE Sensitive     * RARE SERRATIA MARCESCENS  Culture, Urine     Status: Abnormal   Collection Time: 10/23/20  1:20 AM   Specimen: Urine, Catheterized  Result Value Ref Range Status   Specimen Description URINE, CATHETERIZED  Final   Special Requests NONE  Final   Culture (A)  Final    >=100,000 COLONIES/mL  ENTEROBACTER CLOACAE Susceptibility Pattern Suggests Possibility of an Extended Spectrum Beta Lactamase Producer. Contact Laboratory Within 7 Days if Confirmation Warranted. Performed at [facility], [place] [address] [place], [place] [address]    Report Status 10/28/2020 FINAL  Final   Organism ID, Bacteria ENTEROBACTER CLOACAE (A)  Final      Susceptibility   Enterobacter cloacae - MIC*    CEFAZOLIN >=64 RESISTANT Resistant     CEFEPIME >=32 RESISTANT Resistant     CIPROFLOXACIN 2 INTERMEDIATE Intermediate     GENTAMICIN <=1 SENSITIVE Sensitive     IMIPENEM 1 SENSITIVE Sensitive     NITROFURANTOIN 64 INTERMEDIATE Intermediate     TRIMETH/SULFA >=320 RESISTANT Resistant     PIP/TAZO 32 INTERMEDIATE Intermediate     * >=100,000 COLONIES/mL ENTEROBACTER CLOACAE  Culture, Urine     Status: Abnormal   Collection Time: 12/04/20  9:07 AM   Specimen: Urine, Catheterized  Result Value Ref Range Status   Specimen Description URINE, CATHETERIZED  Final   Special Requests   Final    NONE Performed at [facility], [place] [address] [place],  [address]    Culture >=100,000 COLONIES/mL KLEBSIELLA PNEUMONIAE (A)  Final   Report Status 12/06/2020 FINAL  Final   Organism ID, Bacteria KLEBSIELLA PNEUMONIAE (A)  Final      Susceptibility   Klebsiella pneumoniae - MIC*    AMPICILLIN >=32 RESISTANT Resistant     CEFAZOLIN <=  4 SENSITIVE Sensitive     CEFEPIME <=0.12 SENSITIVE Sensitive     CEFTRIAXONE <=0.25 SENSITIVE Sensitive     CIPROFLOXACIN 0.5 SENSITIVE Sensitive     GENTAMICIN <=1 SENSITIVE Sensitive     IMIPENEM <=0.25 SENSITIVE Sensitive     NITROFURANTOIN 128 RESISTANT Resistant     TRIMETH/SULFA <=20 SENSITIVE Sensitive     AMPICILLIN/SULBACTAM 8 SENSITIVE Sensitive     PIP/TAZO 8 SENSITIVE Sensitive     * >=100,000 COLONIES/mL KLEBSIELLA PNEUMONIAE  Resp Panel by RT-PCR (Flu A&B, Covid) Nasopharyngeal Swab     Status: None   Collection Time: 12/04/20 10:10 AM    Specimen: Nasopharyngeal Swab; Nasopharyngeal(NP) swabs in vial transport medium  Result Value Ref Range Status   SARS Coronavirus 2 by RT PCR NEGATIVE NEGATIVE Final    Comment: (NOTE) SARS-CoV-2 target nucleic acids are NOT DETECTED.  The SARS-CoV-2 RNA is generally detectable in upper respiratory specimens during the acute phase of infection. The lowest concentration of SARS-CoV-2 viral copies this assay can detect is 138 copies/mL. A negative result does not preclude SARS-Cov-2 infection and should not be used as the sole basis for treatment or other patient management decisions. A negative result may occur with  improper specimen collection/handling, submission of specimen other than nasopharyngeal swab, presence of viral mutation(s) within the areas targeted by this assay, and inadequate number of viral copies(<138 copies/mL). A negative result must be combined with clinical observations, patient history, and epidemiological information. The expected result is Negative.  Fact Sheet for Patients:  EntrepreneurPulse.com.au  Fact Sheet for Healthcare Providers:  IncredibleEmployment.be  This test is no t yet approved or cleared by the [place] FDA and  has been authorized for detection and/or diagnosis of SARS-CoV-2 by FDA under an Emergency Use Authorization (EUA). This EUA will remain  in effect (meaning this test can be used) for the duration of the COVID-19 declaration under Section 564(b)(1) of the Act, 21 U.S.C.section 360bbb-3(b)(1), unless the authorization is terminated  or revoked sooner.       Influenza A by PCR NEGATIVE NEGATIVE Final   Influenza B by PCR NEGATIVE NEGATIVE Final    Comment: (NOTE) The Xpert Xpress SARS-CoV-2/FLU/RSV plus assay is intended as an aid in the diagnosis of influenza from Nasopharyngeal swab specimens and should not be used as a sole basis for treatment. Nasal washings and aspirates are  unacceptable for Xpert Xpress SARS-CoV-2/FLU/RSV testing.  Fact Sheet for Patients: EntrepreneurPulse.com.au  Fact Sheet for Healthcare Providers: IncredibleEmployment.be  This test is not yet approved or cleared by the [place] FDA and has been authorized for detection and/or diagnosis of SARS-CoV-2 by FDA under an Emergency Use Authorization (EUA). This EUA will remain in effect (meaning this test can be used) for the duration of the COVID-19 declaration under Section 564(b)(1) of the Act, 21 U.S.C. section 360bbb-3(b)(1), unless the authorization is terminated or revoked.  Performed at [facility], [place] [address], [place], [place] [address]   Culture, blood (Routine X 2) w Reflex to ID Panel     Status: None   Collection Time: 12/04/20 10:14 AM   Specimen: BLOOD LEFT HAND  Result Value Ref Range Status   Specimen Description BLOOD LEFT HAND  Final   Special Requests   Final    BOTTLES DRAWN AEROBIC AND ANAEROBIC Blood Culture adequate volume   Culture   Final    NO GROWTH 5 DAYS Performed at [facility], [place] [address], [place], [place] [address]  Report Status 12/09/2020 FINAL  Final  Culture, blood (Routine X 2) w Reflex to ID Panel     Status: None   Collection Time: 12/04/20 10:16 AM   Specimen: BLOOD  Result Value Ref Range Status   Specimen Description BLOOD RIGHT ANTECUBITAL  Final   Special Requests   Final    BOTTLES DRAWN AEROBIC AND ANAEROBIC Blood Culture adequate volume   Culture   Final    NO GROWTH 5 DAYS Performed at [facility], [place] [address], [place], [place] [address]    Report Status 12/09/2020 FINAL  Final  Culture, Urine     Status: Abnormal   Collection Time: 12/17/20  5:54 PM   Specimen: Urine, Clean Catch  Result Value Ref Range Status   Specimen Description URINE, CLEAN CATCH  Final   Special Requests   Final    NONE Performed at [facility], [place] [address],  [place], [place] [address]    Culture 50,000 COLONIES/mL KLEBSIELLA PNEUMONIAE (A)  Final   Report Status 12/19/2020 FINAL  Final   Organism ID, Bacteria KLEBSIELLA PNEUMONIAE (A)  Final      Susceptibility   Klebsiella pneumoniae - MIC*    AMPICILLIN >=32 RESISTANT Resistant     CEFAZOLIN 32 INTERMEDIATE Intermediate     CEFEPIME 0.25 SENSITIVE Sensitive     CEFTRIAXONE 1 SENSITIVE Sensitive     CIPROFLOXACIN 0.5 SENSITIVE Sensitive     GENTAMICIN <=1 SENSITIVE Sensitive     IMIPENEM <=0.25 SENSITIVE Sensitive     NITROFURANTOIN 128 RESISTANT Resistant     TRIMETH/SULFA <=20 SENSITIVE Sensitive     AMPICILLIN/SULBACTAM 8 SENSITIVE Sensitive     PIP/TAZO 8 SENSITIVE Sensitive     * 50,000 COLONIES/mL KLEBSIELLA PNEUMONIAE  Culture, blood (routine x 2)     Status: None (Preliminary result)   Collection Time: 01/16/21  7:01 AM   Specimen: BLOOD RIGHT HAND  Result Value Ref Range Status   Specimen Description BLOOD RIGHT HAND  Final   Special Requests   Final    BOTTLES DRAWN AEROBIC ONLY Blood Culture adequate volume   Culture   Final    NO GROWTH 4 DAYS Performed at [facility], [address], [place], [place] [address]    Report Status PENDING  Incomplete  Culture, blood (routine x 2)     Status: None (Preliminary result)   Collection Time: 01/16/21  7:01 AM   Specimen: BLOOD RIGHT FOREARM  Result Value Ref Range Status   Specimen Description BLOOD RIGHT FOREARM  Final   Special Requests   Final    BOTTLES DRAWN AEROBIC ONLY Blood Culture adequate volume   Culture   Final    NO GROWTH 4 DAYS Performed at [facility], [place] [address], [place], [place] [address]    Report Status PENDING  Incomplete  Resp Panel by RT-PCR (Flu A&B, Covid) Nasopharyngeal Swab     Status: None   Collection Time: 01/18/21  7:49 AM   Specimen: Nasopharyngeal Swab; Nasopharyngeal(NP) swabs in vial transport medium  Result Value Ref Range Status   SARS Coronavirus 2 by RT PCR NEGATIVE  NEGATIVE Final    Comment: (NOTE) SARS-CoV-2 target nucleic acids are NOT DETECTED.  The SARS-CoV-2 RNA is generally detectable in upper respiratory specimens during the acute phase of infection. The lowest concentration of SARS-CoV-2 viral copies this assay can detect is 138 copies/mL. A negative result does not preclude SARS-Cov-2 infection and should not  be used as the sole basis for treatment or other patient management decisions. A negative result may occur with  improper specimen collection/handling, submission of specimen other than nasopharyngeal swab, presence of viral mutation(s) within the areas targeted by this assay, and inadequate number of viral copies(<138 copies/mL). A negative result must be combined with clinical observations, patient history, and epidemiological information. The expected result is Negative.  Fact Sheet for Patients:  EntrepreneurPulse.com.au  Fact Sheet for Healthcare Providers:  IncredibleEmployment.be  This test is no t yet approved or cleared by the [place] FDA and  has been authorized for detection and/or diagnosis of SARS-CoV-2 by FDA under an Emergency Use Authorization (EUA). This EUA will remain  in effect (meaning this test can be used) for the duration of the COVID-19 declaration under Section 564(b)(1) of the Act, 21 U.S.C.section 360bbb-3(b)(1), unless the authorization is terminated  or revoked sooner.       Influenza A by PCR NEGATIVE NEGATIVE Final   Influenza B by PCR NEGATIVE NEGATIVE Final    Comment: (NOTE) The Xpert Xpress SARS-CoV-2/FLU/RSV plus assay is intended as an aid in the diagnosis of influenza from Nasopharyngeal swab specimens and should not be used as a sole basis for treatment. Nasal washings and aspirates are unacceptable for Xpert Xpress SARS-CoV-2/FLU/RSV testing.  Fact Sheet for Patients: EntrepreneurPulse.com.au  Fact Sheet for Healthcare  Providers: IncredibleEmployment.be  This test is not yet approved or cleared by the [place] FDA and has been authorized for detection and/or diagnosis of SARS-CoV-2 by FDA under an Emergency Use Authorization (EUA). This EUA will remain in effect (meaning this test can be used) for the duration of the COVID-19 declaration under Section 564(b)(1) of the Act, 21 U.S.C. section 360bbb-3(b)(1), unless the authorization is terminated or revoked.  Performed at [facility], [place] [address] [place], [place] [address]   Culture, Urine     Status: Abnormal   Collection Time: 01/18/21  9:58 AM   Specimen: Urine, Catheterized  Result Value Ref Range Status   Specimen Description URINE, CATHETERIZED  Final   Special Requests   Final    NONE Performed at [facility], [place] [address] [place], [place] [address]    Culture (A)  Final    >=100,000 COLONIES/mL KLEBSIELLA PNEUMONIAE Confirmed Extended Spectrum Beta-Lactamase Producer (ESBL).  In bloodstream infections from ESBL organisms, carbapenems are preferred over piperacillin/tazobactam. They are shown to have a lower risk of mortality.    Report Status 01/20/2021 FINAL  Final   Organism ID, Bacteria KLEBSIELLA PNEUMONIAE (A)  Final      Susceptibility   Klebsiella pneumoniae - MIC*    AMPICILLIN >=32 RESISTANT Resistant     CEFAZOLIN >=64 RESISTANT Resistant     CEFEPIME <=0.12 SENSITIVE Sensitive     CEFTRIAXONE 1 SENSITIVE Sensitive     CIPROFLOXACIN 0.5 SENSITIVE Sensitive     GENTAMICIN <=1 SENSITIVE Sensitive     IMIPENEM <=0.25 SENSITIVE Sensitive     NITROFURANTOIN 128 RESISTANT Resistant     TRIMETH/SULFA <=20 SENSITIVE Sensitive     AMPICILLIN/SULBACTAM 8 SENSITIVE Sensitive     PIP/TAZO 8 SENSITIVE Sensitive     * >=100,000 COLONIES/mL KLEBSIELLA PNEUMONIAE    Pertinent Lab seen by me: CBC Latest Ref Rng & Units 01/16/2021 12/19/2020 12/18/2020  WBC 4.0 - 10.5 K/uL 6.0 5.0 5.5  Hemoglobin  12.0 - 15.0 g/dL 10.6(L) 10.9(L) 11.0(L)  Hematocrit 36.0 - 46.0 % 33.8(L) 34.4(L) 35.0(L)  Platelets 150 - 400 K/uL  306 281 315   CMP Latest Ref Rng & Units 01/16/2021 12/19/2020 12/18/2020  Glucose 70 - 99 mg/dL 129(H) 110(H) 106(H)  BUN 6 - 20 mg/dL _0 Creatinine 0.44 - 1.00 mg/dL 0.50 0.44 0.49  Sodium 135 - 145 mmol/L 138 140 139  Potassium 3.5 - 5.1 mmol/L 3.7 3.9 3.7  Chloride 98 - 111 mmol/L 99 102 101  CO2 22 - 32 mmol/L _1 Calcium 8.9 - 10.3 mg/dL 9.4 9.4 9.5  Total Protein 6.5 - 8.1 g/dL 6.8 7.0 -  Total Bilirubin 0.3 - 1.2 mg/dL 0.6 0.5 -  Alkaline Phos 38 - 126 U/L 94 97 -  AST 15 - 41 U/L 25 27 -  ALT 0 - 44 U/L 40 35 -     Pertinent Imagings/Other Imagings Plain films and CT images have been personally visualized and interpreted; radiology reports have been reviewed. Decision making incorporated into the Impression / Recommendations.   I spent more than 70  minutes for this patient encounter including review of prior medical records/discussing diagnostics and treatment plan with the patient/family/coordinate care with primary/other specialits with greater than 50% of time in face to face encounter.   Electronically signed by:   Rosiland Oz, MD Infectious Disease Physician [facility] Pager: [phone]

## 2021-01-21 ENCOUNTER — Inpatient Hospital Stay (HOSPITAL_COMMUNITY): Payer: Medicaid Other

## 2021-01-21 LAB — BASIC METABOLIC PANEL
Anion gap: 8 (ref 5–15)
BUN: 17 mg/dL (ref 6–20)
CO2: 27 mmol/L (ref 22–32)
Calcium: 9.2 mg/dL (ref 8.9–10.3)
Chloride: 103 mmol/L (ref 98–111)
Creatinine, Ser: 0.71 mg/dL (ref 0.44–1.00)
GFR, Estimated: >60 mL/min (ref >60)
Glucose, Bld: 94 mg/dL (ref 70–99)
Potassium: 4.1 mmol/L (ref 3.5–5.1)
Sodium: 138 mmol/L (ref 135–145)

## 2021-01-21 LAB — GLUCOSE, CAPILLARY
Glucose-Capillary: 104 mg/dL — ABNORMAL HIGH (ref 70–99)
Glucose-Capillary: 106 mg/dL — ABNORMAL HIGH (ref 70–99)
Glucose-Capillary: 106 mg/dL — ABNORMAL HIGH (ref 70–99)
Glucose-Capillary: 110 mg/dL — ABNORMAL HIGH (ref 70–99)
Glucose-Capillary: 113 mg/dL — ABNORMAL HIGH (ref 70–99)
Glucose-Capillary: 118 mg/dL — ABNORMAL HIGH (ref 70–99)

## 2021-01-21 LAB — CBC
HCT: 33.3 % — ABNORMAL LOW (ref 36.0–46.0)
Hemoglobin: 10.7 g/dL — ABNORMAL LOW (ref 12.0–15.0)
MCH: 24.8 pg — ABNORMAL LOW (ref 26.0–34.0)
MCHC: 32.1 g/dL (ref 30.0–36.0)
MCV: 77.3 fL — ABNORMAL LOW (ref 80.0–100.0)
Platelets: 288 10*3/uL (ref 150–400)
RBC: 4.31 MIL/uL (ref 3.87–5.11)
RDW: 14.9 % (ref 11.5–15.5)
WBC: 6.2 10*3/uL (ref 4.0–10.5)
nRBC: 0 % (ref 0.0–0.2)

## 2021-01-21 LAB — CULTURE, BLOOD (ROUTINE X 2)
Culture: NO GROWTH
Culture: NO GROWTH
Special Requests: ADEQUATE
Special Requests: ADEQUATE

## 2021-01-21 MED ORDER — TIZANIDINE HCL 4 MG PO TABS
2.0000 mg | ORAL_TABLET | Freq: Every day | ORAL | Status: DC
  Administered 2021-01-21 – 2021-01-25 (×5): 2 mg via ORAL
  Filled 2021-01-21 (×5): qty 1

## 2021-01-21 NOTE — Progress Notes (Signed)
SLP Cancellation Note  Patient Details Name: Charlene [name] MRN: [id number] DOB: 10-27-1965   Cancelled treatment:       Reason Eval/Treat Not Completed: Other (comment) Transport attempted to get pt x2 this morning for MBS. Per radiology, on initial attempt, pt was receiving other care and on second attempt, daughter declined testing because she did not feel comfortable with pt leaving the room for the study given her trach. Note that this specific test cannot be performed at bedside, but pt did have her initial MBS 6/3 and had her trach at that time. Will continue attempts to complete study on subsequent date, including following up with family for ongoing education.    Mahala Menghini., M.A. CCC-SLP Acute Rehabilitation Services Pager [phone] Office [phone]  01/21/2021, 9:35 AM

## 2021-01-21 NOTE — Plan of Care (Signed)
  Problem: Coping: Goal: Will identify appropriate support needs Outcome: Progressing   Problem: Health Behavior/Discharge Planning: Goal: Ability to manage health-related needs will improve Outcome: Progressing   Problem: Self-Care: Goal: Verbalization of feelings and concerns over difficulty with self-care will improve Outcome: Progressing Goal: Ability to communicate needs accurately will improve Outcome: Progressing   Problem: Nutrition: Goal: Risk of aspiration will decrease Outcome: Progressing   Problem: Intracerebral Hemorrhage Tissue Perfusion: Goal: Complications of Intracerebral Hemorrhage will be minimized Outcome: Progressing   Problem: Intracerebral Hemorrhage Tissue Perfusion: Goal: Complications of Intracerebral Hemorrhage will be minimized Outcome: Progressing   Problem: Ischemic Stroke/TIA Tissue Perfusion: Goal: Complications of ischemic stroke/TIA will be minimized Outcome: Progressing   Problem: Education: Goal: Knowledge of General Education information will improve Description: Including pain rating scale, medication(s)/side effects and non-pharmacologic comfort measures Outcome: Progressing   Problem: Health Behavior/Discharge Planning: Goal: Ability to manage health-related needs will improve Outcome: Progressing   Problem: Coping: Goal: Level of anxiety will decrease Outcome: Progressing   Problem: Elimination: Goal: Will not experience complications related to bowel motility Outcome: Progressing Goal: Will not experience complications related to urinary retention Outcome: Progressing   Problem: Pain Managment: Goal: General experience of comfort will improve Outcome: Progressing   Problem: Skin Integrity: Goal: Risk for impaired skin integrity will decrease Outcome: Progressing

## 2021-01-21 NOTE — Progress Notes (Signed)
CSW spoke with Marcelle Smiling at [place] [facility] to request the contact number for assigned Disability worker. Marcelle Smiling states the assigned worker is Lafonda Mosses at [phone] ext. [phone].  CSW spoke with Lafonda Mosses who states she needs additional medical records before a decision is made by Disability Determination Services. CSW advised Lafonda Mosses to request records from the office of Medical Records however she states since the patient has not consented for the release of records, she cannot request them. CSW explained patient's current medical condition which prohibits her from consenting for the release of any records. CSW explained that the patient is married to Alison Murray and he is her decision maker at this time - however Lafonda Mosses states that [name] would be required to be HCPOA prior to consenting on that patient's behalf. Lafonda Mosses requested contact information for a Artist - CSW provided Lafonda Mosses with [employer]. Lafonda Mosses to speak directly with Dennison Bulla and return callt o CSW.  Edwin Dada, MSW, LCSW Transitions of Care  Clinical Social Worker II [phone]

## 2021-01-21 NOTE — Plan of Care (Signed)
  Problem: Coping: Goal: Will identify appropriate support needs Outcome: Progressing   Problem: Health Behavior/Discharge Planning: Goal: Ability to manage health-related needs will improve Outcome: Progressing   Problem: Self-Care: Goal: Verbalization of feelings and concerns over difficulty with self-care will improve Outcome: Progressing Goal: Ability to communicate needs accurately will improve Outcome: Progressing   Problem: Nutrition: Goal: Risk of aspiration will decrease Outcome: Progressing   Problem: Intracerebral Hemorrhage Tissue Perfusion: Goal: Complications of Intracerebral Hemorrhage will be minimized Outcome: Progressing   Problem: Education: Goal: Knowledge of General Education information will improve Description: Including pain rating scale, medication(s)/side effects and non-pharmacologic comfort measures Outcome: Progressing   Problem: Health Behavior/Discharge Planning: Goal: Ability to manage health-related needs will improve Outcome: Progressing   Problem: Clinical Measurements: Goal: Ability to maintain clinical measurements within normal limits will improve Outcome: Progressing Goal: Will remain free from infection Outcome: Progressing Goal: Diagnostic test results will improve Outcome: Progressing Goal: Respiratory complications will improve Outcome: Progressing Goal: Cardiovascular complication will be avoided Outcome: Progressing   Problem: Coping: Goal: Level of anxiety will decrease Outcome: Progressing   Problem: Elimination: Goal: Will not experience complications related to bowel motility Outcome: Progressing Goal: Will not experience complications related to urinary retention Outcome: Progressing   Problem: Pain Managment: Goal: General experience of comfort will improve Outcome: Progressing   Problem: Skin Integrity: Goal: Risk for impaired skin integrity will decrease Outcome: Progressing   Problem: Intracerebral  Hemorrhage Tissue Perfusion: Goal: Complications of Intracerebral Hemorrhage will be minimized Outcome: Progressing   Problem: Ischemic Stroke/TIA Tissue Perfusion: Goal: Complications of ischemic stroke/TIA will be minimized Outcome: Progressing   

## 2021-01-21 NOTE — Progress Notes (Signed)
[facility] PROGRESS NOTE  Charlene [name] XFG:[id number] DOB: Jul 01, 1966 DOA: 08/26/2020 PCP: Patient, [name] (Inactive)    October 08, 2020     10/16/2020                        10/16/2020     10/18/2020                        10/20/2020      12/30/2020                   12/30/2020   01/04/2021   Status: Remains inpatient appropriate because:Altered mental status and Unsafe d/c plan: Requirement to discharge to trach capable SNF   Dispo: The patient is from: Home              Anticipated d/c is to: SNF trach capable-[place]-we have recently discovered that the [facility] may have a trach bed available in clinicals have been sent to the facility for review -trach team re eval 6/3 and still rec NOT to decannulate 2/2 and lack of appropriate voluntary and involuntary cough response              Patient currently is medically stable to d/c.   Difficult to place patient Yes  **7/5: SW spoke directly with patient's Medicaid coordinator who confirmed that they do not need any additional information from the hospital regarding her application.  Unable to proceed any further until disability approved.   Level of care: Telemetry Medical  Code Status: Full Family Communication: husband Charlene [name] [phone]) is her POA and legal contact.  Mother at bedside 7/4 DVT prophylaxis: Chronically bedbound.  5/6: We will discontinue prophylactic Lovenox   Vaccination status: Unvaccinated-husband still undecided.  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  Subsequently on 2/10, she developed obtundation and respiratory arrest leading to intubation.  She has remained persistently obtunded, now has a trach and a PEG.  Hospital course complicated by persistent fever, Serratia pneumonia and now pre-orbital cellulitis  As of 3/25 with adjustments in hypertonicity medications patient began to improve regarding hypertonicity.  Eventually started on provigil with improvements in alertness.  Evaluated by rehab physician who recommended increasing provigil dose further.  Subsequently patient has become more alert.  Most days she is able to track and with encouragement able to follow simple commands especially since partial obstruction glasses placed.  See progress note dictated 4/27 for expanded details.   2/09 Admit for [place] 2/10 EVD placed, later intubated due to obtundation and respiratory arrest 2/11 MRI Brain, hypertonic saline d/c'd, EVD not working.  Husband did not consent to PICC  2/13 EVD removed 2/17 [place] discussion with family, PMT 2/24 Bedside trach and PEG 3/1 Transferred to [facility]  Subjective: Charlene [name] is alert and able to communicate that she is continuing to have right knee pain despite current scheduled analgesics.  Mother also at bedside.  We are observing [name] having some focal spasmodic activity of right foot left upper extremity.  Objective: Vitals:   01/20/21 2345 01/21/21 0034  BP:  111/73  Pulse: 72 67  Resp: 13 17  Temp:  99.1 F (37.3 C)  SpO2: 98% 100%    Intake/Output Summary (Last 24 hours) at 01/21/2021 0800 Last data filed at 01/21/2021 0300 Gross per 24 hour  Intake 30360 ml  Output 600 ml  Net 29760 ml     Filed Weights   01/19/21 0500 01/20/21 0500 01/21/21 0500  Weight: 62.7 kg 61.6 kg 61.1 kg    Exam:       Gen: Alert, calm affect, grimacing and able to localize pain.  Also  smiles appropriately when a joke was told. Respiratory: #4.0 cuffless trach, trach collar with FiO2 21% at 5L, lungs remain clear, extremely weak cough reflex Cardiovascular: S1 S2, no peripheral edema, normotensive Abdomen:  PEG for tube feeding-LBM 7/6, nontender with normoactive bowel sounds. Musculoskeletal: Continues to have right knee pain although not as severe as previous noting I can lightly palpate without causing patient to grimace.  Still has difficulty extending her right leg due to pain. Neurologic: Left facial droop, right upper extremity strength 4/5 with ability to point.  Left upper extremity strength 1/5.  Right lower extremity strength difficult to test given patient's propensity to keep knees flexed secondary to knee pain.  Left lower extremity strength about 1-2/5 Psychiatric: Awake, continues to interact in room.  Mouthing words and attempts to phonate but at a whispered tone.  Smiles and grimaces appropriately.   Assessment/Plan: Acute problems: Multiple acute strokes  Right Thalamus ICH with IVH  Obstructive Hydrocephalus/Acute on persistent metabolic Encephalopathy/Non TBI related hypertonicity -Continue baclofen and tizanidine for spasticity.  7/7: Add tizanidine mg daily for noted continued muscle spasm-currently on a total of 30 mg per 24 hours of baclofen therefore no room to increase this medication further. -Continue Provigil and rehabilitative therapies -Reevaluated by CIR (5/2 ) with recommendation for SNF -Her brain injury involves corona radiata, midbrain with associated midline shift and right basal ganglia.  These areas correlate with awareness and purposeful movement -Continues to work well and consistently with therapies.    Tracheostomy dependent -Primary barrier to decannulation is poor to at times absent cough reflex.  Inconsistent provoked cough effort and when she does cough very weak and basically ineffective -6/16 [name] d/w her husband..She  documented that recovery would be very slow and at best she is weeks to months away from safe decannulation. Her very slow recovery trajectory is concerning for requirement for permanent trach/no decannulation. -7/6 reevaluated again by pulmonary medicine with recommendations to not decannulate due to safety concern 6/20 reevaluated by trach team -not yet a candidate for capping 6/30 SLP considering low-level respiratory muscle strength training as patient mentation allows to improve candidacy for future decannulation.  Recurrent UTI versus acute cystitis Evaluated by ID on 7/6 and not felt to be an acute UTI and instead cystitis. Recommendation is to complete Septra today 7/7  Rhythmic activity of right side EEG negative for seizure activity  Colonic Ileus/recurrent constipation/loose stools Constipation resolved-last bowel movement 7/4  Acute dental pain -CT maxillofacial without evidence of overt infection or abscess -Continue Orajel QID and Magic mouthwash with lidocaine for symptom  Recurrent right knee pain/known ligamentous injury and chronic osteoarthritis -s/p dry tap with injection of Marcaine and Depomedrol 6/30 -Staff instructed to evaluate patient for continued pain despite  scheduled Celebrex and Tylenol. -Continue Oxy IR 5 mg every 4 hours as needed  Hypertensive emergency/Malignant HTN -Continue Norvasc and hydralazine. -Attending increased beta-blocker to 100 mg twice daily on 7/6  Dysphagia -Continue tube feedings, Prosource liquid, free water -Mother has been trained by SLP to administer orals to patient -SLP attempted to perform MBSS; delayed initially due to patient receiving care.  On second attempt daughter at bedside declined testing because she stated she was not comfortable with the patient leaving the room for her study given her trach.  Please note that the patient has left the unit successfully not only for radiological studies but she has gone outside in a  wheelchair with occupational therapy.  SLP will be updated that from a medical standpoint that it is safe for patient to leave the room if the daughter has any questions she needs to discuss with the patient's husband who is the POA  History of urinary retention -Continue Cardura    Other problems: Right upper extremity thrombophlebitis - [name] -> age indeterminate SVT of right cephalic vein  Neck Contracture/torticollis -- Resolved  ESBL positive Enterobacter UTI -Has completed 5 days of meropenem -Contact isolation during hospitalization -continue Oxybutynin suspected bladder spasms  Klebsiella UTI -Resistant to nitrofurantoin and ampicillin -Meropenem transitioned to Rocephin on 5/22 -To minimize risk of recurrent UTI have asked that pure wick only be used at bedtime on a 10 PM to 8 AM schedule  Recurrent diarrhea -Resolved -Likely combination of recent antibiotics as well as scheduled laxative -MiraLAX now as needed  Bacterial Conjunctivitis /periorbital cellulitis -Resolved  Chronic pansinusitis/recurrent fevers 2/2 aspiration PNA;History of Serratia Pneumonia  Hospital Acquired Pneumonia:  -Completed several courses of antibiotic -Follow-up imaging including sinus CT consistent with chronic but stable and resolving sinusitis   Anemia -Stable  HLD -Continue Lipitor      Data Reviewed: Basic Metabolic Panel: Recent Labs  Lab 01/16/21 0702 01/21/21 0142  NA 138 138  K 3.7 4.1  CL 99 103  CO2 31 27  GLUCOSE 129* 94  BUN 13 17  CREATININE 0.50 0.71  CALCIUM 9.4 9.2    Liver Function Tests: Recent Labs  Lab 01/16/21 0702  AST 25  ALT 40  ALKPHOS 94  BILITOT 0.6  PROT 6.8  ALBUMIN 3.3*    CBC: Recent Labs  Lab 01/16/21 0702 01/21/21 0142  WBC 6.0 6.2  NEUTROABS 3.3  --   HGB 10.6* 10.7*  HCT 33.8* 33.3*  MCV 78.8* 77.3*  PLT 306 288     CBG: Recent Labs  Lab 01/20/21 0752 01/20/21 1245 01/20/21 1533 01/20/21 2035  01/21/21 0030  GLUCAP 84 128* 101* 91 104*     Studies: No results found.   Scheduled Meds:  acetaminophen  650 mg Per Tube Q6H   amLODipine  10 mg Per Tube Daily   atorvastatin  40 mg Per Tube Daily   baclofen  10 mg Per Tube 3 times per day   celecoxib  200 mg Per Tube BID   chlorhexidine gluconate (MEDLINE KIT)  15 mL Mouth Rinse BID   Chlorhexidine Gluconate Cloth  6 each Topical Daily   diclofenac Sodium  4 g Topical QID   doxazosin  2 mg Per Tube Daily   feeding supplement (PROSource TF)  45 mL Per Tube Daily   fiber  1 packet Per Tube BID   free water  200 mL Per Tube Q4H   hydrALAZINE  37.5 mg Per Tube Q8H   insulin aspart  0-9 Units Subcutaneous Q4H   magic mouthwash w/lidocaine  10 mL Oral QID   mouth rinse  15 mL Mouth Rinse 5 X Daily   metoprolol tartrate  100 mg Per Tube BID   modafinil  200 mg Per Tube Daily   pantoprazole sodium  40 mg Per Tube Daily   tiZANidine  4 mg Per Tube QHS   traMADol  25 mg Per Tube Q6H   Continuous Infusions:  sodium chloride Stopped (10/26/20 1445)   feeding supplement (OSMOLITE 1.2 CAL) 60 mL/hr at 01/21/21 0400    Principal Problem:   ICH (intracerebral hemorrhage) (HCC) Active Problems:   Acute hypoxemic respiratory failure (HCC)   Tracheostomy dependent (HCC)   Palliative care by specialist   Muscle hypertonicity   Oropharyngeal dysphagia   Poor prognosis   Aspiration pneumonia of right lower lobe (HCC)   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  Palliative care.   PCCM continues to follow.  No decannulation.   Procedures: Tracheostomy PEG tube EEG Echocardiogram  Antibiotics: Vancomycin 2/12-2/15 Maxipime 2/12-2/15 Ancef 2/19-2/27 Vancomycin 2/20 x 1 dose Maxipime 2/27- 3/5 Vancomycin 3/1-3/3 Ancef  3/6-3/14 Cipro 3/20 4-3/30 Unasyn 3/30-4/4 Rocephin 4/11-4/12 Meropenem 4/12-4/17   Time spent: 25 minutes    Erin Hearing ANP  [facility] 7 am - 330 pm/M-F for direct patient care and secure chat Please refer to [place] for contact info 147  Days

## 2021-01-22 ENCOUNTER — Inpatient Hospital Stay (HOSPITAL_COMMUNITY): Payer: Medicaid Other

## 2021-01-22 LAB — GLUCOSE, CAPILLARY
Glucose-Capillary: 108 mg/dL — ABNORMAL HIGH (ref 70–99)
Glucose-Capillary: 112 mg/dL — ABNORMAL HIGH (ref 70–99)
Glucose-Capillary: 114 mg/dL — ABNORMAL HIGH (ref 70–99)
Glucose-Capillary: 129 mg/dL — ABNORMAL HIGH (ref 70–99)
Glucose-Capillary: 153 mg/dL — ABNORMAL HIGH (ref 70–99)
Glucose-Capillary: 165 mg/dL — ABNORMAL HIGH (ref 70–99)
Glucose-Capillary: 172 mg/dL — ABNORMAL HIGH (ref 70–99)

## 2021-01-22 NOTE — Plan of Care (Signed)
  Problem: Coping: Goal: Will identify appropriate support needs Outcome: Not Progressing   Problem: Health Behavior/Discharge Planning: Goal: Ability to manage health-related needs will improve Outcome: Not Progressing   Problem: Self-Care: Goal: Verbalization of feelings and concerns over difficulty with self-care will improve Outcome: Not Progressing Goal: Ability to communicate needs accurately will improve Outcome: Progressing   Problem: Nutrition: Goal: Risk of aspiration will decrease Outcome: Progressing   Problem: Intracerebral Hemorrhage Tissue Perfusion: Goal: Complications of Intracerebral Hemorrhage will be minimized Outcome: Progressing   Problem: Intracerebral Hemorrhage Tissue Perfusion: Goal: Complications of Intracerebral Hemorrhage will be minimized Outcome: Progressing   Problem: Ischemic Stroke/TIA Tissue Perfusion: Goal: Complications of ischemic stroke/TIA will be minimized Outcome: Progressing   Problem: Education: Goal: Knowledge of General Education information will improve Description: Including pain rating scale, medication(s)/side effects and non-pharmacologic comfort measures Outcome: Not Progressing   Problem: Health Behavior/Discharge Planning: Goal: Ability to manage health-related needs will improve Outcome: Not Progressing   Problem: Clinical Measurements: Goal: Ability to maintain clinical measurements within normal limits will improve Outcome: Not Progressing Goal: Will remain free from infection Outcome: Not Progressing Goal: Diagnostic test results will improve Outcome: Not Progressing Goal: Respiratory complications will improve Outcome: Not Progressing Goal: Cardiovascular complication will be avoided Outcome: Not Progressing   Problem: Coping: Goal: Level of anxiety will decrease Outcome: Not Progressing   Problem: Elimination: Goal: Will not experience complications related to bowel motility Outcome: Not  Progressing Goal: Will not experience complications related to urinary retention Outcome: Not Progressing   Problem: Pain Managment: Goal: General experience of comfort will improve Outcome: Not Progressing   Problem: Skin Integrity: Goal: Risk for impaired skin integrity will decrease Outcome: Not Progressing   

## 2021-01-22 NOTE — Progress Notes (Signed)
Occupational Therapy Treatment Patient Details Name: Charlene [name] MRN: [id number] DOB: 09/07/1965 Today's Date: 01/22/2021    History of present illness Pt is 55 y/o female presents to [facility] on 08/26/20 with L sided weakness and headaches. 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 s/p Right frontal ventricular catheter placement on 2/10, stopped functioning on 2/11.  MRI 2/11 shows right greater than left mesial temporal lobes / parahippocampal cortex, bilateral basal ganglia, and possibly the upper pons that are concerning for acute infarcts, most likely secondary to mass effect from the hemorrhage. ETT 2/10-2/24.  Pt with trach and PEG on 09/10/20. Hospital course complicated by persistent fever, Serratia pneumonia and now pre-orbital cellulitis.   PMH includes anxiety, HTN.   OT comments  Pt seen for self feeding.  She was moved into chair position in the bed.  She was able to feed herself with mod A with Rt UE with support at her elbow.   She tolerated diet well with two episodes of gagging followed by a very forceful cough (informed SLP).  Mother present throughout session.    Follow Up Recommendations  SNF;Supervision/Assistance - 24 hour    Equipment Recommendations  None recommended by OT    Recommendations for Other Services      Precautions / Restrictions Precautions Precautions: Fall Precaution Comments: peg and trach Required Braces or Orthoses: Other Brace Other Brace: bil. PRAFOs.       Mobility Bed Mobility                    Transfers                      Balance                                           ADL either performed or assessed with clinical judgement   ADL Overall ADL's : Needs assistance/impaired Eating/Feeding: Moderate assistance;Bed level Eating/Feeding Details (indicate cue  type and reason): with pt in chair position, and plate in her lap, pt was able to self feed with mod A with support at Rt elbow.  She initially required assist to sequence and motor plan the movement, but by the end of the session, she was able to scoop food onto the spoon without assist and bring it to her mouth with min A at elbow Grooming: Minimal assistance;Bed level (wiping mouth) Grooming Details (indicate cue type and reason): pt self initiated wiping her mouth during self feeding                                     Vision   Additional Comments: Pt consistently fixating on the Rt and at midline   Perception     Praxis      Cognition Arousal/Alertness: Awake/alert Behavior During Therapy: Flat affect Overall Cognitive Status: Impaired/Different from baseline Area of Impairment: Attention;Following commands                   Current Attention Level: Sustained Memory: Decreased short-term memory       Problem Solving: Slow processing;Difficulty sequencing;Requires verbal cues;Requires tactile cues General Comments: Pt self initiated feeding self, but need  hand over hand assist initially to sequence and guide her movement.  She nodded "yes" x 4 in response to questions and shook head "no" x 2        Exercises     Shoulder Instructions       General Comments mother present during session    Pertinent Vitals/ Pain       Pain Assessment: Faces Faces Pain Scale: Hurts little more Pain Location: bil knees with attempts at extending Pain Descriptors / Indicators: Grimacing;Guarding Pain Intervention(s): Monitored during session;Repositioned  Home Living                                          Prior Functioning/Environment              Frequency  Min 2X/week        Progress Toward Goals  OT Goals(current goals can now be found in the care plan section)  Progress towards OT goals: Progressing toward goals (goals  extended and new goal added)  Acute Rehab OT Goals Patient Stated Goal: Pt unable to state OT Goal Formulation: With patient/family Time For Goal Achievement: 02/05/21 Potential to Achieve Goals: Fair ADL Goals Pt Will Perform Eating: with min assist;sitting;bed level;with adaptive utensils Pt/caregiver will Perform Home Exercise Program: With written HEP provided;Both right and left upper extremity;Increased ROM Additional ADL Goal #1: Pt will wash face with set up and supervision in supported sitting Additional ADL Goal #2: Pt will tolerate tilt bed position at 40 degrees for 10 minutes in preparation for ADLs Additional ADL Goal #3: Pt will demonstrate ability to give thumbs up with R hand with 75% accuracy given increased time (2-3 minutes). Additional ADL Goal #4: Pt will tolerate sitting at EOB for 5 minutes with Mod A  Plan Frequency remains appropriate;Discharge plan remains appropriate    Co-evaluation                 AM-PAC OT "6 Clicks" Daily Activity     Outcome Measure   Help from another person eating meals?: A Lot Help from another person taking care of personal grooming?: A Lot Help from another person toileting, which includes using toliet, bedpan, or urinal?: Total Help from another person bathing (including washing, rinsing, drying)?: Total Help from another person to put on and taking off regular upper body clothing?: Total Help from another person to put on and taking off regular lower body clothing?: Total 6 Click Score: 8    End of Session Equipment Utilized During Treatment: Oxygen  OT Visit Diagnosis: Muscle weakness (generalized) (M62.81);Apraxia (R48.2);Cognitive communication deficit (R41.841);Hemiplegia and hemiparesis Symptoms and signs involving cognitive functions: Other Nontraumatic ICH Hemiplegia - Right/Left: Right Hemiplegia - caused by: Nontraumatic intracerebral hemorrhage   Activity Tolerance Patient tolerated treatment well;Patient  limited by pain   Patient Left in bed;with call bell/phone within reach;with family/visitor present (in chair position)   Nurse Communication Mobility status        Time: 7741-2878 OT Time Calculation (min): 43 min  Charges: OT General Charges $OT Visit: 1 Visit OT Treatments $Self Care/Home Management : 38-52 mins  Eber Jones., OTR/L Acute Rehabilitation Services Pager [phone] Office [phone]    Jeani Hawking M 01/22/2021, 1:40 PM

## 2021-01-22 NOTE — TOC Progression Note (Signed)
Transition of Care [facility]) - Progression Note    Patient Details  Name: Charlene [name] MRN: [id number] Date of Birth: [birth year]  [facility] CM/SW Contact  Janae Bridgeman, RN Phone Number: 01/22/2021, 7:36 AM  Clinical Narrative:    Case management spoke with Charlene [name], husband, yesterday afternoon at 1400 and he was updated on accepting bed offers to include [facility] facility in [place], [place] at this time.  I explained to him that the other area facilities including [facility] facilities, [facility], and [facility] have declined bed offers to the patient at this time.  The patient's medicaid application and disability remain pending.  [name] explains that he has contacted DSS and [facility] and is waiting on further communication with [facility] at this time.  Charlene [name] explains that he was notified that the patient has a Guardian Ad Litem - Amy Mayford Knife.  I mentioned that he will need to call the [facility] of court to obtain contact information to speak with her regarding the patient's pending court date on January 28, 2021.   CM and MSW with DTP Team continue to follow the patient for transitions of care needs.   Expected Discharge Plan: Skilled Nursing Facility Barriers to Discharge: Continued Medical Work up  Expected Discharge Plan and Services Expected Discharge Plan: Skilled Nursing Facility In-house Referral: Clinical Social Work Discharge Planning Services: CM Consult Post Acute Care Choice: Skilled Nursing Facility Living arrangements for the past 2 months: Single Family Home                                       Social Determinants of Health (SDOH) Interventions    Readmission Risk Interventions Readmission Risk Prevention Plan 09/29/2020  Transportation Screening Complete  PCP or Specialist Appt within 3-5 Days Complete  HRI or Home Care Consult Complete  Social Work Consult for  Recovery Care Planning/Counseling Complete  Palliative Care Screening Complete  Medication Review [employer]) Complete  Some recent data might be hidden

## 2021-01-22 NOTE — Progress Notes (Signed)
RN notified by telemetry tech at 1006 that pt was showing ST elevation in one of her tele leads, MD paged, vital signs stable, EKG performed. MD reviewed EKG strip, no new orders, medications given as scheduled.

## 2021-01-22 NOTE — TOC Progression Note (Signed)
Transition of Care [facility]) - Progression Note    Patient Details  Name: SHANNELL MIKKELSEN MRN: [id number] Date of Birth: July 19, 1965  Transition of Care [facility]) CM/SW [place], RN Phone Number: 01/22/2021, 2:34 PM  Clinical Narrative:    [name], [name], called and asked that financial counseling call in regards to the patient's pending Medicaid and disability application.  [name] states that he met with the [facility] a few days ago to complete necessary paperwork in regards to the patient's disability.  [name] requested that I refax the patient out in the hub again to skilled nursing facilities in the area in hopes of finding a closer facility.  The patient's clinicals were re-faxed out as requested by the family at this time.  CM left a secure email for Shanon Rosser, financial counselor supervisor to have her call and speak to the patient's husband on the phone to make sure that they have all appropriate information from him as needed.  CM and MSW with DTP Team will continue to follow the patient for transitions of care needs for SNF placement.   Expected Discharge Plan: Skilled Nursing Facility Barriers to Discharge: Continued Medical Work up  Expected Discharge Plan and Services Expected Discharge Plan: [place] In-house Referral: Clinical Social Work Discharge Planning Services: CM Consult Post Acute Care Choice: [place] arrangements for the past 2 months: Single Family Home                                       Social Determinants of Health (SDOH) Interventions    Readmission Risk Interventions Readmission Risk Prevention Plan 09/29/2020  Transportation Screening Complete  PCP or Specialist Appt within 3-5 Days Complete  HRI or [place] Complete  Social Work Consult for [place] Planning/Counseling Complete  Palliative Care Screening  Complete  Medication Review Press photographer) Complete  Some recent data might be hidden

## 2021-01-22 NOTE — Progress Notes (Addendum)
[facility] PROGRESS NOTE  Charlene [name] RSW:[id number] DOB: [birth year] DOA: 08/26/2020 PCP: Patient, [name] (Inactive)    October 08, 2020     10/16/2020                        10/16/2020     10/18/2020                        10/20/2020      12/30/2020                   12/30/2020   01/04/2021   Status: Remains inpatient appropriate because:Altered mental status and Unsafe d/c plan: Requirement to discharge to trach capable SNF   Dispo: The patient is from: Home              Anticipated d/c is to: SNF trach capable-[place]-we have recently discovered that the [facility] may have a trach bed available in clinicals have been sent to the facility for review -trach team re eval 6/3 and still rec NOT to decannulate 2/2 and lack of appropriate voluntary and involuntary cough response              Patient currently is medically stable to d/c.   Difficult to place patient Yes  **7/5: SW spoke directly with patient's Medicaid coordinator who confirmed that they do not need any additional information from the hospital regarding her application.  Unable to proceed any further until disability approved.   Level of care: Telemetry Medical  Code Status: Full Family Communication: husband Charlene [name] [phone]) is her POA and legal contact.  Mother at bedside 7/4 DVT prophylaxis: Chronically bedbound.  5/6: We will discontinue prophylactic Lovenox   Vaccination status: Unvaccinated-husband still undecided.  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  Subsequently on 2/10, she developed obtundation and respiratory arrest leading to intubation.  She has remained persistently obtunded, now has a trach and a PEG.  Hospital course complicated by persistent fever, Serratia pneumonia and now pre-orbital cellulitis  As of 3/25 with adjustments in hypertonicity medications patient began to improve regarding hypertonicity.  Eventually started on provigil with improvements in alertness.  Evaluated by rehab physician who recommended increasing provigil dose further.  Subsequently patient has become more alert.  Most days she is able to track and with encouragement able to follow simple commands especially since partial obstruction glasses placed.  See progress note dictated 4/27 for expanded details.   2/09 Admit for [place] 2/10 EVD placed, later intubated due to obtundation and respiratory arrest 2/11 MRI Brain, hypertonic saline d/c'd, EVD not working.  Husband did not consent to PICC  2/13 EVD removed 2/17 [place] discussion with family, PMT 2/24 Bedside trach and PEG 3/1 Transferred to [facility]  Subjective: Awake.  Just returned from [facility] procedure.  Noted patient with legs flexed beneath her therefore I slowly was able to  partially extend the right leg but unable to significantly extend the left leg.  During the actual movement process she was noted with grimacing and discomfort.  5 minutes afterwards I asked her if she was hurting and she stated no.  Objective: Vitals:   01/22/21 0027 01/22/21 0342  BP: 134/80 (!) 143/89  Pulse: 80 83  Resp: 17 17  Temp: 97.8 F (36.6 C) 97.9 F (36.6 C)  SpO2: 100% 100%    Intake/Output Summary (Last 24 hours) at 01/22/2021 0751 Last data filed at 01/22/2021 0609 Gross per 24 hour  Intake --  Output 1100 ml  Net -1100 ml     Filed Weights   01/20/21 0500 01/21/21 0500 01/22/21  0500  Weight: 61.6 kg 61.1 kg 60.5 kg    Exam:       Gen: Alert and tracking personnel in room.  Appears to not be in distress other than when lower extremities are manipulated Respiratory: #4.0 cuffless trach, trach collar with FiO2 21% at 5L, lung sounds remain clear, no increased work of breathing. Cardiovascular: Regular pulse, normotensive, normal heart sounds, no peripheral edema Abdomen:  PEG for tube feeding-LBM 7/7, soft and nontender with normoactive bowel sounds Musculoskeletal: Continues to have pain in lower extremities and somewhat in right knee especially with PROM of lower extremities but then pain improves Neurologic: Left facial droop, right upper extremity strength 4/5 with ability to point.  Left upper extremity strength 1/5.  Right lower extremity strength difficult to test given patient's propensity to keep knees flexed secondary to knee pain.  Left lower extremity strength about 1-2/5.  Of note now that patient is more alert appears she may have developed hearing loss secondary to her recent stroke.  She tends to respond better to the spoken word when you laying down and speak into her right ear. Psychiatric:    Assessment/Plan: Acute problems: Multiple acute strokes  Right Thalamus ICH with IVH  Obstructive Hydrocephalus/Acute on persistent metabolic Encephalopathy/Non TBI related hypertonicity -Continue baclofen and tizanidine for spasticity.  7/7: Add tizanidine 2 mg daily for noted continued muscle spasm-currently on a total of 30 mg per 24 hours of baclofen therefore no room to increase this medication further.  First dose of a.m. tizanidine will be on 7/8 -Continue Provigil and rehabilitative therapies -Reevaluated by CIR (5/2 ) with recommendation for SNF -Her brain injury involves corona radiata, midbrain with associated midline shift and right basal ganglia.  These areas correlate with awareness and purposeful movement -Continues to work well and consistently  with therapies.   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  He responds better to verbal interaction when you lay down and speak into her right ear.  Tracheostomy dependent -Primary barrier to decannulation is poor to at times absent cough reflex.  Inconsistent provoked cough effort and when she does cough very weak and basically ineffective -6/16 [name] d/w her husband..She documented that recovery would be very slow and at best she is weeks to months away from safe decannulation. Her very slow recovery trajectory is concerning for requirement for permanent trach/no decannulation. -7/6 reevaluated again by pulmonary medicine with recommendations to not decannulate due to safety concern 6/20 reevaluated by trach team -not yet a candidate for capping 6/30 SLP considering low-level respiratory muscle strength training as patient mentation allows to improve candidacy for future decannulation. -OT documented recent forceful cough associated with gagging while self-feeding.  We will ask RT perform deep suction to see  if patient's cough reflex has returned.  Recurrent UTI versus acute cystitis Evaluated by ID on 7/6 and not felt to be an acute UTI and instead cystitis. Recommendation is to complete Septra today 7/7  Rhythmic activity of right side EEG negative for seizure activity  Colonic Ileus/recurrent constipation/loose stools Constipation resolved-last bowel movement 7/4  Acute dental pain -CT maxillofacial without evidence of overt infection or abscess -Continue Orajel QID and Magic mouthwash with lidocaine for symptom  Recurrent right knee pain/known ligamentous injury and chronic osteoarthritis -s/p dry tap with injection of Marcaine and Depomedrol 6/30 -Staff instructed to evaluate patient for continued pain despite scheduled Celebrex and Tylenol. -Continue Oxy IR 5 mg every 4 hours as needed  Hypertensive emergency/Malignant HTN -Continue  Norvasc and hydralazine. -Attending increased beta-blocker to 100 mg twice daily on 7/6  Dysphagia -Continue tube feedings, Prosource liquid, free water -Mother has been trained by SLP to administer orals to patient -7/8 status post MBS and has been upgraded to dysphagia 1 solids and thin liquids via straw with assistance during meals -7/8: OT working with patient at bedside for self-feeding.  Patient gagged twice and was noted with forceful cough  History of urinary retention -Continue Cardura    Other problems: Right upper extremity thrombophlebitis - [name] -> age indeterminate SVT of right cephalic vein  Neck Contracture/torticollis -- Resolved  ESBL positive Enterobacter UTI -Has completed 5 days of meropenem -Contact isolation during hospitalization -continue Oxybutynin suspected bladder spasms  Klebsiella UTI -Resistant to nitrofurantoin and ampicillin -Meropenem transitioned to Rocephin on 5/22 -To minimize risk of recurrent UTI have asked that pure wick only be used at bedtime on a 10 PM to 8 AM schedule  Recurrent diarrhea -Resolved -Likely combination of recent antibiotics as well as scheduled laxative -MiraLAX now as needed  Bacterial Conjunctivitis /periorbital cellulitis -Resolved  Chronic pansinusitis/recurrent fevers 2/2 aspiration PNA;History of Serratia Pneumonia  Hospital Acquired Pneumonia:  -Completed several courses of antibiotic -Follow-up imaging including sinus CT consistent with chronic but stable and resolving sinusitis   Anemia -Stable  HLD -Continue Lipitor      Data Reviewed: Basic Metabolic Panel: Recent Labs  Lab 01/16/21 0702 01/21/21 0142  NA 138 138  K 3.7 4.1  CL 99 103  CO2 31 27  GLUCOSE 129* 94  BUN 13 17  CREATININE 0.50 0.71  CALCIUM 9.4 9.2    Liver Function Tests: Recent Labs  Lab 01/16/21 0702  AST 25  ALT 40  ALKPHOS 94  BILITOT 0.6  PROT 6.8  ALBUMIN 3.3*    CBC: Recent Labs  Lab  01/16/21 0702 01/21/21 0142  WBC 6.0 6.2  NEUTROABS 3.3  --   HGB 10.6* 10.7*  HCT 33.8* 33.3*  MCV 78.8* 77.3*  PLT 306 288     CBG: Recent Labs  Lab 01/21/21 0811 01/21/21 1311 01/21/21 1653 01/21/21 2002 01/22/21 0027  GLUCAP 106* 113* 110* 118* 165*     Studies: No results found.   Scheduled Meds:  acetaminophen  650 mg Per Tube Q6H   amLODipine  10 mg Per Tube Daily   atorvastatin  40 mg Per Tube Daily   baclofen  10 mg Per Tube 3 times per day   celecoxib  200 mg Per Tube BID   chlorhexidine gluconate (MEDLINE KIT)  15 mL Mouth Rinse BID   Chlorhexidine Gluconate Cloth  6 each Topical Daily   diclofenac Sodium  4 g Topical QID   doxazosin  2 mg Per Tube Daily  feeding supplement (PROSource TF)  45 mL Per Tube Daily   fiber  1 packet Per Tube BID   free water  200 mL Per Tube Q4H   hydrALAZINE  37.5 mg Per Tube Q8H   insulin aspart  0-9 Units Subcutaneous Q4H   magic mouthwash w/lidocaine  10 mL Oral QID   mouth rinse  15 mL Mouth Rinse 5 X Daily   metoprolol tartrate  100 mg Per Tube BID   modafinil  200 mg Per Tube Daily   pantoprazole sodium  40 mg Per Tube Daily   tiZANidine  2 mg Oral Daily   tiZANidine  4 mg Per Tube QHS   traMADol  25 mg Per Tube Q6H   Continuous Infusions:  sodium chloride Stopped (10/26/20 1445)   feeding supplement (OSMOLITE 1.2 CAL) 60 mL/hr at 01/21/21 0400    Principal Problem:   ICH (intracerebral hemorrhage) ([place]) Active Problems:   Acute hypoxemic respiratory failure (HCC)   Tracheostomy dependent ([place])   Palliative care by specialist   Muscle hypertonicity   Oropharyngeal dysphagia   Poor prognosis   Aspiration pneumonia of right lower lobe ([place])   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   Acute cystitis without hematuria   Ileus, unspecified ([place])   Constipation   Knee pain, right   Consultants: Neurology  Neurosurgery.  Palliative care.   PCCM  continues to follow.  No decannulation.   Procedures: Tracheostomy PEG tube EEG Echocardiogram  Antibiotics: Vancomycin 2/12-2/15 Maxipime 2/12-2/15 Ancef 2/19-2/27 Vancomycin 2/20 x 1 dose Maxipime 2/27- 3/5 Vancomycin 3/1-3/3 Ancef 3/6-3/14 Cipro 3/20 4-3/30 Unasyn 3/30-4/4 Rocephin 4/11-4/12 Meropenem 4/12-4/17   Time spent: 25 minutes    Erin Hearing ANP  [facility] 7 am - 330 pm/M-F for direct patient care and secure chat Please refer to [place] for contact info 148  Days

## 2021-01-23 NOTE — Plan of Care (Signed)
  Problem: Coping: Goal: Will identify appropriate support needs Outcome: Not Progressing   Problem: Health Behavior/Discharge Planning: Goal: Ability to manage health-related needs will improve Outcome: Not Progressing   Problem: Self-Care: Goal: Verbalization of feelings and concerns over difficulty with self-care will improve Outcome: Not Progressing Goal: Ability to communicate needs accurately will improve Outcome: Progressing   Problem: Nutrition: Goal: Risk of aspiration will decrease Outcome: Progressing   Problem: Intracerebral Hemorrhage Tissue Perfusion: Goal: Complications of Intracerebral Hemorrhage will be minimized Outcome: Progressing   Problem: Intracerebral Hemorrhage Tissue Perfusion: Goal: Complications of Intracerebral Hemorrhage will be minimized Outcome: Progressing   Problem: Ischemic Stroke/TIA Tissue Perfusion: Goal: Complications of ischemic stroke/TIA will be minimized Outcome: Not Progressing   Problem: Education: Goal: Knowledge of General Education information will improve Description: Including pain rating scale, medication(s)/side effects and non-pharmacologic comfort measures Outcome: Not Progressing   Problem: Clinical Measurements: Goal: Ability to maintain clinical measurements within normal limits will improve Outcome: Not Progressing Goal: Will remain free from infection Outcome: Not Progressing Goal: Diagnostic test results will improve Outcome: Not Progressing Goal: Respiratory complications will improve Outcome: Not Progressing Goal: Cardiovascular complication will be avoided Outcome: Not Progressing   Problem: Coping: Goal: Level of anxiety will decrease Outcome: Not Progressing   Problem: Elimination: Goal: Will not experience complications related to bowel motility Outcome: Not Progressing Goal: Will not experience complications related to urinary retention Outcome: Not Progressing   Problem: Pain Managment: Goal:  General experience of comfort will improve Outcome: Not Progressing   Problem: Skin Integrity: Goal: Risk for impaired skin integrity will decrease Outcome: Not Progressing

## 2021-01-23 NOTE — Progress Notes (Signed)
Patient seen and examined -55 year old female with history of uncontrolled hypertension admitted with intracranial hemorrhage complicated by hydrocephalus requiring EVD, required prolonged mechanical ventilation/respiratory support, subsequently with trach and PEG. -Remains medically stable, per pulmonary no plans for decannulation at this time -Awaiting trach SNF  Zannie Cove, MD

## 2021-01-24 NOTE — Progress Notes (Addendum)
Patient seen and examined, no changes from the note by Junious Silk, NP -55 year old female with history of uncontrolled hypertension admitted with intracranial hemorrhage complicated by hydrocephalus requiring EVD, required prolonged mechanical ventilation/respiratory support, subsequently with trach and PEG. -Remains medically stable, per pulmonary no plans for decannulation at this time -Awaiting trach SNF, check labs periodically  Zannie Cove, MD

## 2021-01-24 NOTE — Plan of Care (Signed)
  Problem: Coping: Goal: Will identify appropriate support needs Outcome: Not Progressing   Problem: Health Behavior/Discharge Planning: Goal: Ability to manage health-related needs will improve Outcome: Not Progressing   Problem: Self-Care: Goal: Verbalization of feelings and concerns over difficulty with self-care will improve Outcome: Not Progressing Goal: Ability to communicate needs accurately will improve Outcome: Progressing   Problem: Nutrition: Goal: Risk of aspiration will decrease Outcome: Progressing   Problem: Intracerebral Hemorrhage Tissue Perfusion: Goal: Complications of Intracerebral Hemorrhage will be minimized Outcome: Progressing   Problem: Intracerebral Hemorrhage Tissue Perfusion: Goal: Complications of Intracerebral Hemorrhage will be minimized Outcome: Progressing   Problem: Ischemic Stroke/TIA Tissue Perfusion: Goal: Complications of ischemic stroke/TIA will be minimized Outcome: Progressing   Problem: Education: Goal: Knowledge of General Education information will improve Description: Including pain rating scale, medication(s)/side effects and non-pharmacologic comfort measures Outcome: Not Progressing   Problem: Health Behavior/Discharge Planning: Goal: Ability to manage health-related needs will improve Outcome: Not Progressing   Problem: Clinical Measurements: Goal: Ability to maintain clinical measurements within normal limits will improve Outcome: Not Progressing Goal: Will remain free from infection Outcome: Not Progressing Goal: Diagnostic test results will improve Outcome: Not Progressing Goal: Respiratory complications will improve Outcome: Not Progressing Goal: Cardiovascular complication will be avoided Outcome: Not Progressing   Problem: Coping: Goal: Level of anxiety will decrease Outcome: Not Progressing   Problem: Elimination: Goal: Will not experience complications related to bowel motility Outcome: Not  Progressing Goal: Will not experience complications related to urinary retention Outcome: Not Progressing   Problem: Pain Managment: Goal: General experience of comfort will improve Outcome: Not Progressing   Problem: Skin Integrity: Goal: Risk for impaired skin integrity will decrease Outcome: Not Progressing

## 2021-01-25 DIAGNOSIS — R5381 Other malaise: Secondary | ICD-10-CM

## 2021-01-25 LAB — GLUCOSE, CAPILLARY
Glucose-Capillary: 117 mg/dL — ABNORMAL HIGH (ref 70–99)
Glucose-Capillary: 101 mg/dL — ABNORMAL HIGH (ref 70–99)
Glucose-Capillary: 103 mg/dL — ABNORMAL HIGH (ref 70–99)
Glucose-Capillary: 105 mg/dL — ABNORMAL HIGH (ref 70–99)
Glucose-Capillary: 107 mg/dL — ABNORMAL HIGH (ref 70–99)
Glucose-Capillary: 109 mg/dL — ABNORMAL HIGH (ref 70–99)
Glucose-Capillary: 120 mg/dL — ABNORMAL HIGH (ref 70–99)
Glucose-Capillary: 135 mg/dL — ABNORMAL HIGH (ref 70–99)
Glucose-Capillary: 136 mg/dL — ABNORMAL HIGH (ref 70–99)
Glucose-Capillary: 84 mg/dL (ref 70–99)
Glucose-Capillary: 101 mg/dL — ABNORMAL HIGH (ref 70–99)
Glucose-Capillary: 117 mg/dL — ABNORMAL HIGH (ref 70–99)
Glucose-Capillary: 72 mg/dL (ref 70–99)
Glucose-Capillary: 108 mg/dL — ABNORMAL HIGH (ref 70–99)
Glucose-Capillary: 106 mg/dL — ABNORMAL HIGH (ref 70–99)
Glucose-Capillary: 121 mg/dL — ABNORMAL HIGH (ref 70–99)

## 2021-01-25 NOTE — Progress Notes (Signed)
   01/25/21 2038  Therapy Vitals  Pulse Rate 96  Resp 18  Patient Position (if appropriate) Lying  MEWS Score/Color  MEWS Score 0  MEWS Score Color Green  Respiratory Assessment  Assessment Type Assess only  Respiratory Pattern Regular;Unlabored  Chest Assessment Chest expansion symmetrical  Cough Weak  Sputum Amount None  Sputum Consistency None  Sputum Specimen Source Tracheostomy tube  Bilateral Breath Sounds Clear  R Upper  Breath Sounds Clear  L Upper Breath Sounds Clear  R Lower Breath Sounds Clear  L Lower Breath Sounds Clear  Oxygen Therapy/Pulse Ox  $ Pulse Ox Multi Deter  Yes  O2 Device Tracheostomy Collar  O2 Therapy Room air;Room air humidified  O2 Flow Rate (L/min) 5 L/min  FiO2 (%) 21 %  SpO2 100 %  Tracheostomy Shiley Flexible 4 mm Uncuffed  Placement Date/Time: 01/17/21 1652   Placed By: Self  Brand: Shiley Flexible  Size (mm): 4 mm  Style: Uncuffed  Status Secured  Site Assessment Clean;Dry  Site Care Dried;Cleansed;Dressing applied;Protective barrier to skin  Inner Cannula Care Cleansed/dried;Changed/new  Ties Assessment Clean;Dry;Secure  Cuff Pressure (cm H2O)  (cfs)  Tracheostomy Equipment at bedside Yes and checklist posted at head of bed  Respiratory  Airway LDA Tracheostomy  Pt. Has weak cough

## 2021-01-25 NOTE — Progress Notes (Signed)
   NAME:  Charlene [name], MRN:  [id number], DOB:  1966-01-11, LOS: 151 ADMISSION DATE:  08/26/2020, CONSULTATION DATE:  2/10 REFERRING MD:  Roda Shutters, CHIEF COMPLAINT:  headache   History of Present Illness:  55 year old female admitted with hypertensive R thalamic/basal ganglia ICH. Resultant obstructive hydrocephalus prompting EVD placement 2/10. Subsequently, 2/10 she had acute mental status change to obtundation and respiratory arrest resulting in intubation. Trach and Peg 2/24. Trach change to #6 cuffless on 3/11.   Pertinent  Medical History  Hypertension, Medication non-compliance  Significant Hospital Events: Including procedures, antibiotic start and stop dates in addition to other pertinent events   2/09 Admit for ICH 2/10 EVD placed, later intubated due to obtundation and respiratory arrest 2/11 MRI Brain, hypertonic saline d/c'd, EVD not working.  Husband did not consent to PICC  2/13 EVD removed 2/17 GOC discussion with family, PMT 2/24 tracheostomy placed by Dr. Denese Killings, PEG placed by Dr. Sheliah Hatch 3/11 trach changed to Shiley 6 uncuffed 3/21 sputum culture with Serratia resistant to Ancef 4/18 28% ATC / 5L  5/08 downsized trach to 4  Interim History / Subjective:  Seen by RT this morning. No significant tracheal secretions, has not been requiring suctioning recently. Would not cough on command for RT.  Objective   Blood pressure 140/88, pulse 84, temperature 98.9 F (37.2 C), temperature source Oral, resp. rate 16, height 5\' 5"  (1.651 m), weight 61.5 kg, last menstrual period 03/03/2014, SpO2 100 %, unknown if currently breastfeeding.    FiO2 (%):  [21 %] 21 %   Intake/Output Summary (Last 24 hours) at 01/25/2021 0748 Last data filed at 01/25/2021 0616 Gross per 24 hour  Intake 220 ml  Output 1600 ml  Net -1380 ml    Filed Weights   01/21/21 0500 01/22/21 0500 01/23/21 0500  Weight: 61.1 kg 60.5 kg 61.5 kg    Examination:  General - middle aged woman lying in bed  in NAD, eating with the assistance of her aide HEENT- [place]/AT, eyes anicteric Neck: trach site clean, no drainage or bleeding Cardiac - S1S2, RRR Chest - CTAB, no tachypnea. No observed coughing while eating. Abdomen - soft, NT Extremities - no edema, no clubbing or cyanosis Skin - no rashes; warm & dry Neuro - R gaze preference, would not track to the left unless her aide walked with her food to the left side of the bed. Not following commands for me. Would not cough despite encouragement. Swallowed multiple times after bites, seemed adequate.  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 - Continue routine trach care. D/w RT, who had the same assessment regarding inability to get any airway protective efforts on command. Not a candidate for decannulation at this time without the ability to help augment her cough with encouragement as she will likely require assistance clearing if she aspirates. Still very deconditioned. - Goal SpO2 >90%; on TC 21% - Appreciate SLP's assistance- MBSS to reassess her was refused by family on 7/7. Given lack of strong cough and delay in cough reflex in the past, I agree that it would be hard to safely advance her diet without this assessment. Con't PMV.  PCCM will follow up again in week of 02/01/21 - call if assistance is needed sooner.   02/03/21, DO 01/25/21 8:20 AM [facility]

## 2021-01-25 NOTE — Progress Notes (Signed)
  Speech Language Pathology Treatment: Dysphagia  Patient Details Name: Charlene [name] MRN: [id number] DOB: 06/23/1966 Today's Date: 01/25/2021 Time: 1011-1030 SLP Time Calculation (min) (ACUTE ONLY): 19 min  Assessment / Plan / Recommendation Clinical Impression  Pt was seen following MBS on 7/8, with mother at bedside. She reports good intake and no overt difficulty with meals that she has helped with since diet initiation. PMV was placed and pt consumed purees and thin liquids, assisting with self-feeding. She takes small, single sips fairly consistently, with SLP helping a few times by removing the straw to limit volume. She had minimal anterior loss of thin liquids x1 and needed a tactile cue from a dry spoon x1 to facilitate swallow trigger. No overt s/s of aspiration were observed. Recommend continuing Dys 1 diet and thin liquids with PMV in place during intake. Will continue to follow.    HPI HPI: Pt is 55 y/o female presents to [facility] on 2/9 with L sided weakness and headaches. PMH includes anxiety, HTN. 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 S/p Right frontal ventricular catheter placement on 2/10, stopped functioning on 2/11.  MRI 2/11 shows right greater than left mesial temporal lobes / parahippocampal cortex, bilateral basal ganglia, and possibly the upper pons that are concerning for acute infarcts, most likely secondary to mass effect from the hemorrhage. ETT 2/10; trach/PEG 09/10/20.  Trach changed to #6 cuffless 3/11; #4 cuffless 5/4. Hospital course complicated by persistent fever,  Serratia pneumonia and pre-orbital cellulitis.      SLP Plan  Continue with current plan of care       Recommendations  Diet recommendations: Dysphagia 1 (puree);Thin liquid Liquids provided via: Straw Medication Administration: Via alternative  means Supervision: Staff to assist with self feeding;Trained caregiver to feed patient;Full supervision/cueing for compensatory strategies Compensations: Minimize environmental distractions Postural Changes and/or Swallow Maneuvers: Seated upright 90 degrees      Patient may use Passy-Muir Speech Valve: During all waking hours (remove during sleep);During PO intake/meals PMSV Supervision: Intermittent         Oral Care Recommendations: Oral care BID Follow up Recommendations: Skilled Nursing facility SLP Visit Diagnosis: Dysphagia, oropharyngeal phase (R13.12) Plan: Continue with current plan of care       GO                Mahala Menghini., M.A. CCC-SLP Acute Rehabilitation Services Pager [phone] Office [phone]  01/25/2021, 10:44 AM

## 2021-01-25 NOTE — Progress Notes (Signed)
Physical Therapy Treatment Patient Details Name: Charlene [name] MRN: [id number] DOB: 1965-09-03 Today's Date: 01/25/2021    History of Present Illness Pt is 55 y/o female presents to [facility] on 08/26/20 with L sided weakness and headaches. 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 s/p Right frontal ventricular catheter placement on 2/10, stopped functioning on 2/11.  MRI 2/11 shows right greater than left mesial temporal lobes / parahippocampal cortex, bilateral basal ganglia, and possibly the upper pons that are concerning for acute infarcts, most likely secondary to mass effect from the hemorrhage. ETT 2/10-2/24.  Pt with trach and PEG on 09/10/20. Hospital course complicated by persistent fever, Serratia pneumonia and now pre-orbital cellulitis.   PMH includes anxiety, HTN.    PT Comments    Pt progressing slowly towards physical therapy goals. Mother present and reports that pt appears sleepy today, and has not been as verbal as she typically is. Pt minimally attempting verbalizations throughout session, and did not attempt to verbalize when asked a specific question. Focus of session was B knee extension activity. I am concerned that utilizing the tilt bed will become more difficult due to knee flexion contractures. Soft braces left on R knee and L elbow. Will continue to follow and progress as able per POC.    Follow Up Recommendations  SNF     Equipment Recommendations  Wheelchair (measurements PT);Wheelchair cushion (measurements PT);Hospital bed;Other (comment) (18x18 tilt in space, hoyer lift)    Recommendations for Other Services Rehab consult     Precautions / Restrictions Precautions Precautions: Fall Precaution Comments: peg and trach Required Braces or Orthoses: Other Brace Other Brace: bil. PRAFOs. Restrictions Weight Bearing  Restrictions: No    Mobility  Bed Mobility Overal bed mobility: Needs Assistance Bed Mobility: Rolling Rolling: Max assist;+2 for physical assistance         General bed mobility comments: Rolling for peri-care after BM. Several attempts to get pt to reach for L railing to initiate roll without success. Once in sidelying, pt with tight grip on the L rail with R hand and required multimodal cues to release    Transfers                 General transfer comment: Did not attempt this session  Ambulation/Gait             General Gait Details: non ambulatory   Stairs             Wheelchair Mobility    Modified Rankin (Stroke Patients Only) Modified Rankin (Stroke Patients Only) Pre-Morbid Rankin Score: No symptoms Modified Rankin: Severe disability     Balance       Sitting balance - Comments: Pt sitting up in chair position in bed without truncal deviation. Did not attempt unsupported sitting.                                    Cognition Arousal/Alertness: Awake/alert Behavior During Therapy: Flat affect Overall Cognitive Status: Impaired/Different from baseline Area of Impairment: Attention;Following commands                 Orientation Level:  (Mom reports that she asked pt what her name was, and pt correctly stated "Charlene [name]") Current Attention Level: Sustained Memory: Decreased short-term memory Following Commands: Follows one step commands  inconsistently;Follows one step commands with increased time Safety/Judgement: Decreased awareness of deficits Awareness: Intellectual Problem Solving: Slow processing;Difficulty sequencing;Requires verbal cues;Requires tactile cues General Comments: Pt following ~25% 1 step commands this session.      Exercises General Exercises - Upper Extremity Shoulder Flexion: PROM;5 reps;Seated;Left Elbow Flexion: PROM;10 reps;Left Elbow Extension: PROM;Seated;Left;10 reps Other  Exercises Other Exercises: Bilateral knee extension stretch. Pt resisting at times, and a majority of the sessionh focused on gaining knee extension Other Exercises: L soft knee brace and R soft elbow brace donned at end of session. Other Exercises: Gentle lumbar rotation stretch with B hips and knees flexed (knees stretched to the L)    General Comments General comments (skin integrity, edema, etc.): Mother present throughout duration of session      Pertinent Vitals/Pain Pain Assessment: Faces Faces Pain Scale: Hurts little more Pain Location: B knees, R worse than L Pain Descriptors / Indicators: Grimacing;Guarding Pain Intervention(s): Limited activity within patient's tolerance;Monitored during session;Premedicated before session    Home Living                      Prior Function            PT Goals (current goals can now be found in the care plan section) Acute Rehab PT Goals Patient Stated Goal: Pt unable to state PT Goal Formulation: Patient unable to participate in goal setting Time For Goal Achievement: 02/08/21 Potential to Achieve Goals: Poor Additional Goals Additional Goal #1: Provide positioning, mobility, and transfer education to family and have family participate in these activities during PT Progress towards PT goals: Progressing toward goals    Frequency    Min 2X/week      PT Plan Current plan remains appropriate    Co-evaluation              AM-PAC PT "6 Clicks" Mobility   Outcome Measure  Help needed turning from your back to your side while in a flat bed without using bedrails?: Total Help needed moving from lying on your back to sitting on the side of a flat bed without using bedrails?: Total Help needed moving to and from a bed to a chair (including a wheelchair)?: Total Help needed standing up from a chair using your arms (e.g., wheelchair or bedside chair)?: Total Help needed to walk in hospital room?: Total Help needed  climbing 3-5 steps with a railing? : Total 6 Click Score: 6    End of Session Equipment Utilized During Treatment: Oxygen Activity Tolerance: Patient tolerated treatment well Patient left: in bed;with call bell/phone within reach;with family/visitor present Nurse Communication: Mobility status PT Visit Diagnosis: Hemiplegia and hemiparesis;Other symptoms and signs involving the nervous system (R29.898);Other abnormalities of gait and mobility (R26.89) Hemiplegia - Right/Left: Right (R LE and left UE/LE) Hemiplegia - dominant/non-dominant: Dominant Hemiplegia - caused by: Cerebral infarction;Nontraumatic intracerebral hemorrhage;Other Nontraumatic intracranial hemorrhage     Time: 1308-6578 PT Time Calculation (min) (ACUTE ONLY): 48 min  Charges:  $Therapeutic Exercise: 23-37 mins $Therapeutic Activity: 8-22 mins                     Conni Slipper, PT, DPT Acute Rehabilitation Services Pager: [phone] Office: [phone]    Marylynn Pearson 01/25/2021, 12:00 PM

## 2021-01-25 NOTE — Plan of Care (Signed)
  Problem: Coping: Goal: Will identify appropriate support needs Outcome: Progressing   Problem: Health Behavior/Discharge Planning: Goal: Ability to manage health-related needs will improve Outcome: Progressing   Problem: Self-Care: Goal: Verbalization of feelings and concerns over difficulty with self-care will improve Outcome: Progressing Goal: Ability to communicate needs accurately will improve Outcome: Progressing   Problem: Nutrition: Goal: Risk of aspiration will decrease Outcome: Progressing   Problem: Intracerebral Hemorrhage Tissue Perfusion: Goal: Complications of Intracerebral Hemorrhage will be minimized Outcome: Progressing   Problem: Education: Goal: Knowledge of General Education information will improve Description: Including pain rating scale, medication(s)/side effects and non-pharmacologic comfort measures Outcome: Progressing   Problem: Health Behavior/Discharge Planning: Goal: Ability to manage health-related needs will improve Outcome: Progressing   Problem: Clinical Measurements: Goal: Ability to maintain clinical measurements within normal limits will improve Outcome: Progressing Goal: Will remain free from infection Outcome: Progressing Goal: Diagnostic test results will improve Outcome: Progressing Goal: Respiratory complications will improve Outcome: Progressing Goal: Cardiovascular complication will be avoided Outcome: Progressing   Problem: Coping: Goal: Level of anxiety will decrease Outcome: Progressing   Problem: Elimination: Goal: Will not experience complications related to bowel motility Outcome: Progressing Goal: Will not experience complications related to urinary retention Outcome: Progressing   Problem: Pain Managment: Goal: General experience of comfort will improve Outcome: Progressing   Problem: Skin Integrity: Goal: Risk for impaired skin integrity will decrease Outcome: Progressing   Problem: Intracerebral  Hemorrhage Tissue Perfusion: Goal: Complications of Intracerebral Hemorrhage will be minimized Outcome: Progressing   Problem: Ischemic Stroke/TIA Tissue Perfusion: Goal: Complications of ischemic stroke/TIA will be minimized Outcome: Progressing   

## 2021-01-25 NOTE — Progress Notes (Signed)
After RT assessment, weak cough noted during suctioning. Patient had no secretions during suctioning. RT observed thinning of food during feeding by mom.  Mom stated that patient has had strong cough during feedings before. No cough on demand. RT will continue to monitor and suction each day .

## 2021-01-25 NOTE — Progress Notes (Addendum)
[facility] PROGRESS NOTE  Charlene [name] RCB:[id number] DOB: [birth year] DOA: 08/26/2020 PCP: Patient, [name] (Inactive)    October 08, 2020     10/16/2020                        10/16/2020     10/18/2020                        10/20/2020      12/30/2020                   12/30/2020   01/04/2021   Status: Remains inpatient appropriate because:Altered mental status and Unsafe d/c plan: Requirement to discharge to trach capable SNF   Dispo: The patient is from: Home              Anticipated d/c is to: SNF trach capable[place]-we have recently discovered that [facility] may have a trach bed available in clinicals have been sent to the facility for review -trach team re eval 6/3 and still rec NOT to decannulate 2/2 and lack of appropriate voluntary and involuntary cough response              Patient currently is medically stable to d/c.   Difficult to place patient Yes  **7/5: SW spoke directly with patient's Medicaid coordinator who confirmed that they do not need any additional information from the hospital regarding her application.  Unable to proceed any further until disability approved.   Level of care: Telemetry Medical  Code Status: Full Family Communication: husband Charlene [name] [phone]) is her POA and legal contact.  [name] by telephone on 7/11 DVT prophylaxis: Chronically bedbound.  5/6: We will discontinue prophylactic Lovenox   Vaccination status: Unvaccinated-husband still undecided.  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  Subsequently on 2/10, she developed obtundation and respiratory arrest leading to intubation.  She has remained persistently obtunded, now has a trach and a PEG.  Hospital course complicated by persistent fever, Serratia pneumonia and now pre-orbital cellulitis  As of 3/25 with adjustments in hypertonicity medications patient began to improve regarding hypertonicity.  Eventually started on provigil with improvements in alertness.  Evaluated by rehab physician who recommended increasing provigil dose further.  Subsequently patient has become more alert.  Most days she is able to track and with encouragement able to follow simple commands especially since partial obstruction glasses placed.  See progress note dictated 4/27 for expanded details.   2/09 Admit for [place] 2/10 EVD placed, later intubated due to obtundation and respiratory arrest 2/11 MRI Brain, hypertonic saline d/c'd, EVD not working.  Husband did not consent to PICC  2/13 EVD removed 2/17 [place] discussion with family, PMT 2/24 Bedside trach and PEG 3/1 Transferred to [facility]  Subjective: Sleeping soundly and did not awaken.  Spoke with husband Charlene [name].  He states that over the weekend patient was  able to successfully eat/self-feed and drink multiple thin liquid beverages without difficulty.  Objective: Vitals:   01/25/21 0705 01/25/21 0741  BP: 140/88   Pulse: 84   Resp: 16   Temp: 98.9 F (37.2 C)   SpO2: 100% 100%    Intake/Output Summary (Last 24 hours) at 01/25/2021 0757 Last data filed at 01/25/2021 0616 Gross per 24 hour  Intake 220 ml  Output 1600 ml  Net -1380 ml     Filed Weights   01/21/21 0500 01/22/21 0500 01/23/21 0500  Weight: 61.1 kg 60.5 kg 61.5 kg    Exam:       Gen:  Respiratory: #4.0 cuffless trach, trach collar with FiO2 21% at 5L, anterior lung  sounds remain clear.  No increased work of breathing while sleeping.  Managing oral secretions without drooling. Cardiovascular: S1-S2, pulse regular, no peripheral edema.  Normotensive. Abdomen:  PEG for tube feeding-LBM 7/10, soft nontender nondistended with normoactive bowel sounds. Neurologic: Asleep currently.  At baseline has left facial droop, significant weakness left side which is unchanged.  Has purposeful movement right upper extremity with decreased movement right lower extremity that could be influenced by ongoing knee pain Psychiatric: Sleeping.  At baseline has waxing and waning mentation.  When she is alert she tracks in the room and attempts to phonate with limited success.   Assessment/Plan: Acute problems: Multiple acute strokes  Right Thalamus ICH with IVH  Obstructive Hydrocephalus/Acute on persistent metabolic Encephalopathy/Non TBI related hypertonicity -Continue baclofen and tizanidine for spasticity. 7/11 Increased daytime sleepiness so have dc'd Tizandine 2 mg q am -Continue Provigil and rehabilitative therapies -Reevaluated by CIR (5/2 ) with recommendation for SNF -Her brain injury involves corona radiata, midbrain with associated midline shift and right basal ganglia.  These areas correlate with awareness and purposeful movement -Continues to work well and consistently with therapies.   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  He responds better to verbal interaction when you lay down and speak into her right ear.  Tracheostomy dependent -Primary barrier to decannulation is poor to at times absent cough reflex.  Inconsistent provoked cough effort and when she does cough very weak and basically ineffective -6/16 [name] d/w her husband..She documented that recovery would be very slow and at best she is weeks to months away from safe decannulation. Her very slow recovery trajectory is concerning for requirement for permanent  trach/no decannulation. -7/6 reevaluated again by pulmonary medicine with recommendations to not decannulate due to safety concern 6/20 reevaluated by trach team -not yet a candidate for capping 6/30 SLP considering low-level respiratory muscle strength training as patient mentation allows to improve candidacy for future decannulation. -7/11 discussed with trach team physician and plans are to perform daily deep suction at different times during the day to see if there is a pattern and consistency in ability to elicit deep cough if at all.  Recurrent UTI versus acute cystitis Evaluated by ID on 7/6 and not felt to be an acute UTI and instead cystitis. Recommendation is to complete Septra today 7/7  Rhythmic activity of right side EEG negative for seizure activity  Colonic Ileus/recurrent constipation/loose stools Constipation resolved-last bowel movement 7/4  Acute dental pain -CT maxillofacial without evidence of overt infection or abscess -Continue Orajel QID and Magic mouthwash with lidocaine for symptom  Recurrent right knee pain/known ligamentous injury and chronic osteoarthritis -s/p dry tap with injection of Marcaine and Depomedrol 6/30 -Staff instructed to evaluate patient for  continued pain despite scheduled Celebrex and Tylenol. -Continue Oxy IR 5 mg every 4 hours as needed  Hypertensive emergency/Malignant HTN -Continue Norvasc and hydralazine. -Attending increased beta-blocker to 100 mg twice daily on 7/6  Dysphagia -Continue tube feedings, Prosource liquid, free water -Mother has been trained by SLP to administer orals to patient -7/8 status post MBS and has been upgraded to dysphagia 1 solids and thin liquids via straw with assistance during meals -7/8: OT working with patient at bedside for self-feeding.  Patient gagged twice and was noted with forceful cough  History of urinary retention -Continue Cardura    Other problems: Right upper extremity  thrombophlebitis - [name] -> age indeterminate SVT of right cephalic vein  Neck Contracture/torticollis -- Resolved  ESBL positive Enterobacter UTI -Has completed 5 days of meropenem -Contact isolation during hospitalization -continue Oxybutynin suspected bladder spasms  Klebsiella UTI -Resistant to nitrofurantoin and ampicillin -Meropenem transitioned to Rocephin on 5/22 -To minimize risk of recurrent UTI have asked that pure wick only be used at bedtime on a 10 PM to 8 AM schedule  Recurrent diarrhea -Resolved -Likely combination of recent antibiotics as well as scheduled laxative -MiraLAX now as needed  Bacterial Conjunctivitis /periorbital cellulitis -Resolved  Chronic pansinusitis/recurrent fevers 2/2 aspiration PNA;History of Serratia Pneumonia  Hospital Acquired Pneumonia:  -Completed several courses of antibiotic -Follow-up imaging including sinus CT consistent with chronic but stable and resolving sinusitis   Anemia -Stable  HLD -Continue Lipitor      Data Reviewed: Basic Metabolic Panel: Recent Labs  Lab 01/21/21 0142  NA 138  K 4.1  CL 103  CO2 27  GLUCOSE 94  BUN 17  CREATININE 0.71  CALCIUM 9.2    Liver Function Tests: No results for input(s): AST, ALT, ALKPHOS, BILITOT, PROT, ALBUMIN in the last 168 hours.   CBC: Recent Labs  Lab 01/21/21 0142  WBC 6.2  HGB 10.7*  HCT 33.3*  MCV 77.3*  PLT 288     CBG: Recent Labs  Lab 01/22/21 1240 01/22/21 1645 01/22/21 2010 01/22/21 2303 01/25/21 0736  GLUCAP 153* 108* 112* 172* 117*     Studies: No results found.   Scheduled Meds:  acetaminophen  650 mg Per Tube Q6H   amLODipine  10 mg Per Tube Daily   atorvastatin  40 mg Per Tube Daily   baclofen  10 mg Per Tube 3 times per day   celecoxib  200 mg Per Tube BID   chlorhexidine gluconate (MEDLINE KIT)  15 mL Mouth Rinse BID   Chlorhexidine Gluconate Cloth  6 each Topical Daily   diclofenac Sodium  4 g Topical QID   doxazosin   2 mg Per Tube Daily   feeding supplement (PROSource TF)  45 mL Per Tube Daily   fiber  1 packet Per Tube BID   free water  200 mL Per Tube Q4H   hydrALAZINE  37.5 mg Per Tube Q8H   insulin aspart  0-9 Units Subcutaneous Q4H   magic mouthwash w/lidocaine  10 mL Oral QID   mouth rinse  15 mL Mouth Rinse 5 X Daily   metoprolol tartrate  100 mg Per Tube BID   modafinil  200 mg Per Tube Daily   pantoprazole sodium  40 mg Per Tube Daily   tiZANidine  2 mg Oral Daily   tiZANidine  4 mg Per Tube QHS   traMADol  25 mg Per Tube Q6H   Continuous Infusions:  sodium chloride Stopped (10/26/20 1445)   feeding  supplement (OSMOLITE 1.2 CAL) 1,000 mL (01/23/21 1823)    Principal Problem:   ICH (intracerebral hemorrhage) ([place]) Active Problems:   Acute hypoxemic respiratory failure (HCC)   Tracheostomy dependent (HCC)   Palliative care by specialist   Muscle hypertonicity   Oropharyngeal dysphagia   Poor prognosis   Aspiration pneumonia of right lower lobe ([place])   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   Acute cystitis without hematuria   Ileus, unspecified ([place])   Constipation   Knee pain, right   Consultants: Neurology  Neurosurgery.  Palliative care.   PCCM continues to follow.  No decannulation.   Procedures: Tracheostomy PEG tube EEG Echocardiogram  Antibiotics: Vancomycin 2/12-2/15 Maxipime 2/12-2/15 Ancef 2/19-2/27 Vancomycin 2/20 x 1 dose Maxipime 2/27- 3/5 Vancomycin 3/1-3/3 Ancef 3/6-3/14 Cipro 3/20 4-3/30 Unasyn 3/30-4/4 Rocephin 4/11-4/12 Meropenem 4/12-4/17   Time spent: 25 minutes    Erin Hearing ANP  [facility] 7 am - 330 pm/M-F for direct patient care and secure chat Please refer to [place] for contact info 151  Days

## 2021-01-25 NOTE — Progress Notes (Addendum)
11:30am: CSW spoke with Charlene [name], attorney regarding upcoming court hearing.  9:15am: CSW met with patient, her mother Charlene [name], and assigned DSS Guardian Ad Litem Charlene [name] at bedside for discussion. GAL and patient's mother discussed the patient's current medical condition and the improvements she has made. [name] stated she was told the patient had a strong enough cough to work towards decannulation, however CSW states that per recent documentation from Dr. Noemi Chapel, the patient is not a candidate for decannulation at this time.  CSW and GAL spoke privately in 3W conference room - CSW informed GAL of placement efforts for patient. CSW informed GAL of barriers to placement including her current medical condition and lack of insurance.   The court date for the incompetency hearing is 01/28/2021 at 2pm. CSW will attend along with Nathaniel Man, [facility] Director.  CSW spoke with Alma Friendly at [facility] in [place] who is requesting updated clinicals to ensure the facility can still accommodate the patient's needs. CSW sent clinicals via secure e-mail.  [name], MSW, LCSW Transitions of Care  Clinical Social Worker II [phone]

## 2021-01-26 LAB — GLUCOSE, CAPILLARY
Glucose-Capillary: 115 mg/dL — ABNORMAL HIGH (ref 70–99)
Glucose-Capillary: 121 mg/dL — ABNORMAL HIGH (ref 70–99)
Glucose-Capillary: 131 mg/dL — ABNORMAL HIGH (ref 70–99)
Glucose-Capillary: 137 mg/dL — ABNORMAL HIGH (ref 70–99)
Glucose-Capillary: 139 mg/dL — ABNORMAL HIGH (ref 70–99)
Glucose-Capillary: 88 mg/dL (ref 70–99)
Glucose-Capillary: 96 mg/dL (ref 70–99)
Glucose-Capillary: 99 mg/dL (ref 70–99)

## 2021-01-26 MED ORDER — ENSURE ENLIVE PO LIQD
237.0000 mL | Freq: Three times a day (TID) | ORAL | Status: DC
  Administered 2021-01-26 – 2021-01-27 (×5): 237 mL

## 2021-01-26 NOTE — Progress Notes (Signed)
[facility] PROGRESS NOTE  Charlene [name] WCB:[id number] DOB: [birth year] DOA: 54/03/2021 PCP: Patient, [name] (Inactive)    October 08, 2020     10/16/2020                        10/16/2020     10/18/2020                        10/20/2020      12/30/2020                   12/30/2020   01/04/2021   Status: Remains inpatient appropriate because:Altered mental status and Unsafe d/c plan: Requirement to discharge to trach capable SNF   Dispo: The patient is from: Home              Anticipated d/c is to: SNF trach capable-[place] available-we have recently discovered that [facility] may have a trach bed available in clinicals have been sent to the facility for review -trach team re eval 6/3 and still rec NOT to decannulate 2/2 and lack of appropriate voluntary and involuntary cough response              Patient currently is medically stable to d/c.   Difficult to place patient Yes  **7/5: SW spoke directly with patient's Medicaid coordinator who confirmed that they do not need any additional information from the hospital regarding her application.  Unable to proceed any further until disability approved.   Level of care: Telemetry Medical  Code Status: Full Family Communication: husband Charlene [name] [phone]) is her POA and legal contact.  [name] by telephone on 7/11 DVT prophylaxis: Chronically bedbound.  5/6: We will discontinue prophylactic Lovenox   Vaccination status: Unvaccinated-husband still undecided.  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  Subsequently on 2/10, she developed obtundation and respiratory arrest leading to intubation.  She has remained persistently obtunded, now has a trach and a PEG.  Hospital course complicated by persistent fever, Serratia pneumonia and now pre-orbital cellulitis  As of 3/25 with adjustments in hypertonicity medications patient began to improve regarding hypertonicity.  Eventually started on provigil with improvements in alertness.  Evaluated by rehab physician who recommended increasing provigil dose further.  Subsequently patient has become more alert.  Most days she is able to track and with encouragement able to follow simple commands especially since partial obstruction glasses placed.  See progress note dictated 4/27 for expanded details.   2/09 Admit for [place] 2/10 EVD placed, later intubated due to obtundation and respiratory arrest 2/11 MRI Brain, hypertonic saline d/c'd, EVD not working.  Husband did not consent to PICC  2/13 EVD removed 2/17 [place] discussion with family, PMT 2/24 Bedside trach and PEG 3/1 Transferred to [facility]  Subjective: Patient alert and sitting up in bed.  Using right hand to rub knee.  Having some difficulty hearing me  but when I spoke closer to her right ear she responded in the affirmative that her right knee was hurting and she needed something for pain.  RN at bedside and states patient has been eating 75 to 100% of all meals.  Objective: Vitals:   01/26/21 0443 01/26/21 0734  BP:  120/87  Pulse: 78 94  Resp: 18 18  Temp:  99.4 F (37.4 C)  SpO2: 100%     Intake/Output Summary (Last 24 hours) at 01/26/2021 0804 Last data filed at 01/26/2021 0700 Gross per 24 hour  Intake 237 ml  Output 1350 ml  Net -1113 ml     Filed Weights   01/22/21 0500 01/23/21 0500 01/26/21 0500  Weight: 60.5 kg 61.5 kg  63.8 kg    Exam:       Gen: Alert and at baseline mentation.  Uncomfortable secondary to recurrent right knee pain. Respiratory: #4.0 cuffless trach, trach collar with FiO2 21% at 5L, lungs are clear to auscultation.  No increased work of breathing.  After my initial assessment RT performed wake rested deep suctioning and patient only with weak cough.  No secretions. Cardiovascular: S1-S2, no peripheral edema, regular pulse and normotensive. Abdomen:  PEG for tube feeding-LBM 7/10, has been eating almost 100% of all meals since diet advanced. Neurologic: Alert.  Persistent left facial droop post stroke.  Left side weakness with a degree of hemiparesis although she is able to wiggle fingers and toes inconsistently.  Purposeful movement of right upper extremity.  Right lower extremity difficult to test secondary to ongoing knee pain and patient's preference to keep hip and knees flexed.  Also suspect a degree of hearing loss associated with recent stroke. Psychiatric: Alert.  Slow to follow simple commands.  Appears oriented but this is difficult to adequately assess given inconsistent phonation   Assessment/Plan: Acute problems: Multiple acute strokes  Right Thalamus ICH with IVH  Obstructive Hydrocephalus/Acute on persistent metabolic Encephalopathy/Non TBI related hypertonicity -Continue baclofen and tizanidine for spasticity.  -7/11 Increased daytime sleepiness so dc'd am Tizandine  -Continue Provigil and rehabilitative therapies -Reevaluated by CIR (5/2 ) with recommendation for SNF -Her brain injury involves corona radiata, midbrain with associated midline shift and right basal ganglia.  These areas correlate with awareness and purposeful movement -Continues to work well and consistently with therapies.   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  Inconsistent provoked cough effort and when  she does cough very weak and basically ineffective -Trach team/Dr. Carlis Abbott has had several discussions with patient's husband outlining current limitations for decannulation and uncertainty regarding this would even be possible in the future -Currently we are performing daily deep suctioning to determine cough reflex.  This has occurred x2 in the past 48 hours and both cough reflexes were very weak with deep suctioning -At this time is not a candidate for decannulation  Recurrent UTI versus acute cystitis Evaluated by ID on 7/6 and not felt to be an acute UTI and instead cystitis. Recommendation is to complete Septra today 7/7  Rhythmic activity of right side EEG negative for seizure activity  Colonic Ileus/recurrent constipation/loose stools Constipation resolved-last bowel movement 7/4  Acute dental pain -CT maxillofacial without evidence of overt infection or abscess -Continue Orajel QID and Magic mouthwash with lidocaine for symptom  Recurrent right knee pain/known ligamentous injury and chronic osteoarthritis -s/p injection of Marcaine and Depomedrol 6/30 -Continue scheduled Celebrex, Tylenol and Ultram -7/12 I  have discontinued Oxy IR prn  Hypertensive emergency/Malignant HTN -Continue Norvasc and hydralazine. -Attending increased beta-blocker to 100 mg twice daily on 7/6  Dysphagia -Continue tube feedings, Prosource liquid, free water -Patient has been doing well with diet advanced to dysphagia 1 with thin liquids -We will stop continuous tube feedings for now -Begin bolus feeds between meals with 1 can of Ensure TID  History of urinary retention -Continue Cardura    Other problems: Right upper extremity thrombophlebitis - [name] -> age indeterminate SVT of right cephalic vein  Neck Contracture/torticollis -- Resolved  ESBL positive Enterobacter UTI -Has completed 5 days of meropenem -Contact isolation during hospitalization -continue Oxybutynin suspected bladder  spasms  Klebsiella UTI -Resistant to nitrofurantoin and ampicillin -Meropenem transitioned to Rocephin on 5/22 -To minimize risk of recurrent UTI have asked that pure wick only be used at bedtime on a 10 PM to 8 AM schedule  Recurrent diarrhea -Resolved -Likely combination of recent antibiotics as well as scheduled laxative -MiraLAX now as needed  Bacterial Conjunctivitis /periorbital cellulitis -Resolved  Chronic pansinusitis/recurrent fevers 2/2 aspiration PNA;History of Serratia Pneumonia  Hospital Acquired Pneumonia:  -Completed several courses of antibiotic -Follow-up imaging including sinus CT consistent with chronic but stable and resolving sinusitis   Anemia -Stable  HLD -Continue Lipitor      Data Reviewed: Basic Metabolic Panel: Recent Labs  Lab 01/21/21 0142  NA 138  K 4.1  CL 103  CO2 27  GLUCOSE 94  BUN 17  CREATININE 0.71  CALCIUM 9.2    Liver Function Tests: No results for input(s): AST, ALT, ALKPHOS, BILITOT, PROT, ALBUMIN in the last 168 hours.   CBC: Recent Labs  Lab 01/21/21 0142  WBC 6.2  HGB 10.7*  HCT 33.3*  MCV 77.3*  PLT 288     CBG: Recent Labs  Lab 01/25/21 0736 01/25/21 1243 01/25/21 1632 01/25/21 2031 01/26/21 0001  GLUCAP 117* 108* 106* 121* 137*     Studies: No results found.   Scheduled Meds:  acetaminophen  650 mg Per Tube Q6H   amLODipine  10 mg Per Tube Daily   atorvastatin  40 mg Per Tube Daily   baclofen  10 mg Per Tube 3 times per day   celecoxib  200 mg Per Tube BID   chlorhexidine gluconate (MEDLINE KIT)  15 mL Mouth Rinse BID   Chlorhexidine Gluconate Cloth  6 each Topical Daily   diclofenac Sodium  4 g Topical QID   doxazosin  2 mg Per Tube Daily   feeding supplement (PROSource TF)  45 mL Per Tube Daily   fiber  1 packet Per Tube BID   free water  200 mL Per Tube Q4H   hydrALAZINE  37.5 mg Per Tube Q8H   insulin aspart  0-9 Units Subcutaneous Q4H   magic mouthwash w/lidocaine  10 mL  Oral QID   mouth rinse  15 mL Mouth Rinse 5 X Daily   metoprolol tartrate  100 mg Per Tube BID   modafinil  200 mg Per Tube Daily   pantoprazole sodium  40 mg Per Tube Daily   tiZANidine  4 mg Per Tube QHS   traMADol  25 mg Per Tube Q6H   Continuous Infusions:  sodium chloride Stopped (10/26/20 1445)   feeding supplement (OSMOLITE 1.2 CAL) 1,000 mL (01/23/21 1823)    Principal Problem:   ICH (intracerebral hemorrhage) ([place]) Active Problems:   Acute hypoxemic respiratory failure ([place])   Tracheostomy dependent ([place])   Palliative care by  specialist   Muscle hypertonicity   Oropharyngeal dysphagia   Poor prognosis   Aspiration pneumonia of right lower lobe (HCC)   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   Acute cystitis without hematuria   Ileus, unspecified ([place])   Constipation   Knee pain, right   Consultants: Neurology  Neurosurgery.  Palliative care.   PCCM continues to follow.  No decannulation.   Procedures: Tracheostomy PEG tube EEG Echocardiogram  Antibiotics: Vancomycin 2/12-2/15 Maxipime 2/12-2/15 Ancef 2/19-2/27 Vancomycin 2/20 x 1 dose Maxipime 2/27- 3/5 Vancomycin 3/1-3/3 Ancef 3/6-3/14 Cipro 3/20 4-3/30 Unasyn 3/30-4/4 Rocephin 4/11-4/12 Meropenem 4/12-4/17   Time spent: 25 minutes    Erin Hearing ANP  [facility] 7 am - 330 pm/M-F for direct patient care and secure chat Please refer to [place] for contact info 152  Days

## 2021-01-26 NOTE — Progress Notes (Signed)
Pt playing with bed sheet and pulling it up to her mouth when RT arrived for assessment. Patient does not make eye contact with RT. RT suctioned patient but patient is dry. Cough is very weak and only with tracheal stimulation. RT will continue to monitor.

## 2021-01-26 NOTE — Plan of Care (Signed)
  Problem: Coping: Goal: Will identify appropriate support needs 01/26/2021 1528 by Drue Dun, RN Outcome: Progressing 01/26/2021 1528 by Drue Dun, RN Outcome: Progressing   Problem: Health Behavior/Discharge Planning: Goal: Ability to manage health-related needs will improve 01/26/2021 1528 by Drue Dun, RN Outcome: Progressing 01/26/2021 1528 by Drue Dun, RN Outcome: Progressing   Problem: Self-Care: Goal: Verbalization of feelings and concerns over difficulty with self-care will improve 01/26/2021 1528 by Drue Dun, RN Outcome: Progressing 01/26/2021 1528 by Drue Dun, RN Outcome: Progressing Goal: Ability to communicate needs accurately will improve 01/26/2021 1528 by Drue Dun, RN Outcome: Progressing 01/26/2021 1528 by Drue Dun, RN Outcome: Progressing   Problem: Nutrition: Goal: Risk of aspiration will decrease 01/26/2021 1528 by Drue Dun, RN Outcome: Progressing 01/26/2021 1528 by Drue Dun, RN Outcome: Progressing   Problem: Intracerebral Hemorrhage Tissue Perfusion: Goal: Complications of Intracerebral Hemorrhage will be minimized 01/26/2021 1528 by Drue Dun, RN Outcome: Progressing 01/26/2021 1528 by Drue Dun, RN Outcome: Progressing   Problem: Education: Goal: Knowledge of General Education information will improve Description: Including pain rating scale, medication(s)/side effects and non-pharmacologic comfort measures 01/26/2021 1528 by Drue Dun, RN Outcome: Progressing 01/26/2021 1528 by Drue Dun, RN Outcome: Progressing   Problem: Health Behavior/Discharge Planning: Goal: Ability to manage health-related needs will improve 01/26/2021 1528 by Drue Dun, RN Outcome: Progressing 01/26/2021 1528 by Drue Dun, RN Outcome: Progressing   Problem: Clinical Measurements: Goal: Ability to maintain clinical measurements within normal limits will  improve 01/26/2021 1528 by Drue Dun, RN Outcome: Progressing 01/26/2021 1528 by Drue Dun, RN Outcome: Progressing Goal: Will remain free from infection 01/26/2021 1528 by Drue Dun, RN Outcome: Progressing 01/26/2021 1528 by Drue Dun, RN Outcome: Progressing Goal: Diagnostic test results will improve 01/26/2021 1528 by Drue Dun, RN Outcome: Progressing 01/26/2021 1528 by Drue Dun, RN Outcome: Progressing Goal: Respiratory complications will improve 01/26/2021 1528 by Drue Dun, RN Outcome: Progressing 01/26/2021 1528 by Drue Dun, RN Outcome: Progressing Goal: Cardiovascular complication will be avoided 01/26/2021 1528 by Drue Dun, RN Outcome: Progressing 01/26/2021 1528 by Drue Dun, RN Outcome: Progressing   Problem: Coping: Goal: Level of anxiety will decrease 01/26/2021 1528 by Drue Dun, RN Outcome: Progressing 01/26/2021 1528 by Drue Dun, RN Outcome: Progressing   Problem: Elimination: Goal: Will not experience complications related to bowel motility 01/26/2021 1528 by Drue Dun, RN Outcome: Progressing 01/26/2021 1528 by Drue Dun, RN Outcome: Progressing Goal: Will not experience complications related to urinary retention 01/26/2021 1528 by Drue Dun, RN Outcome: Progressing 01/26/2021 1528 by Drue Dun, RN Outcome: Progressing   Problem: Pain Managment: Goal: General experience of comfort will improve 01/26/2021 1528 by Drue Dun, RN Outcome: Progressing 01/26/2021 1528 by Drue Dun, RN Outcome: Progressing   Problem: Skin Integrity: Goal: Risk for impaired skin integrity will decrease 01/26/2021 1528 by Drue Dun, RN Outcome: Progressing 01/26/2021 1528 by Drue Dun, RN Outcome: Progressing   Problem: Intracerebral Hemorrhage Tissue Perfusion: Goal: Complications of Intracerebral Hemorrhage will be minimized 01/26/2021  1528 by Drue Dun, RN Outcome: Progressing 01/26/2021 1528 by Drue Dun, RN Outcome: Progressing   Problem: Ischemic Stroke/TIA Tissue Perfusion: Goal: Complications of ischemic stroke/TIA will be minimized 01/26/2021 1528 by Drue Dun, RN Outcome: Progressing 01/26/2021 1528 by Drue Dun, RN Outcome: Progressing

## 2021-01-26 NOTE — TOC Progression Note (Addendum)
Transition of Care [facility]) - Progression Note    Patient Details  Name: Charlene [name] MRN: [id number] Date of Birth: 10/11/1965  Transition [facility]) CM/SW Contact  Janae Bridgeman, RN Phone Number: 01/26/2021, 11:53 AM  Clinical Narrative:    Case management spoke with Revonda Standard, CM at [facility] in [place] and the facility is unable to offer a facility bed at this time due to patient's complex patient care needs.  [facility] in [place], [place] is the only available bed offer for the patient at this time.  [name], [name] is aware.  The patient has a pending court date scheduled for tomorrow 01/27/2021.  CM and MSW will continue to follow the patient for transitions of care needs.   Expected Discharge Plan: Skilled Nursing Facility Barriers to Discharge: Continued Medical Work up  Expected Discharge Plan and Services Expected Discharge Plan: Skilled Nursing Facility In-house Referral: Clinical Social Work Discharge Planning Services: CM Consult Post Acute Care Choice: Skilled Nursing Facility Living arrangements for the past 2 months: Single Family Home                                       Social Determinants of Health (SDOH) Interventions    Readmission Risk Interventions Readmission Risk Prevention Plan 09/29/2020  Transportation Screening Complete  PCP or Specialist Appt within 3-5 Days Complete  HRI or Home Care Consult Complete  Social Work Consult for Recovery Care Planning/Counseling Complete  Palliative Care Screening Complete  Medication Review Oceanographer) Complete  Some recent data might be hidden

## 2021-01-26 NOTE — Progress Notes (Signed)
   01/26/21 0443  Therapy Vitals  Pulse Rate 78  Resp 18  Patient Position (if appropriate) Lying  MEWS Score/Color  MEWS Score 0  MEWS Score Color Green  Respiratory Assessment  Assessment Type Assess only  Respiratory Pattern Regular;Unlabored  Chest Assessment Chest expansion symmetrical  Cough Weak  Sputum Amount None  Sputum Consistency None  Sputum Specimen Source Tracheostomy tube  Bilateral Breath Sounds Clear  R Upper  Breath Sounds Clear  L Upper Breath Sounds Clear  R Lower Breath Sounds Clear  L Lower Breath Sounds Clear  Oxygen Therapy/Pulse Ox  $ Pulse Ox Multi Deter  Yes  O2 Device Tracheostomy Collar  O2 Therapy Room air humidified;Room air  O2 Flow Rate (L/min) 5 L/min  FiO2 (%) 21 %  SpO2 100 %  Tracheostomy Shiley Flexible 4 mm Uncuffed  Placement Date/Time: 01/17/21 1652   Placed By: Self  Brand: Shiley Flexible  Size (mm): 4 mm  Style: Uncuffed  Status Secured  Site Assessment Dry;Clean  Site Care Dried;Cleansed  Inner Cannula Care Cleansed/dried  Ties Assessment Clean;Dry;Secure  Cuff Pressure (cm H2O)  (cfs)  Tracheostomy Equipment at bedside Yes and checklist posted at head of bed  Respiratory  Airway LDA Tracheostomy  Pt. Has weak cough when suctioned RT will continue to monitor

## 2021-01-26 NOTE — Progress Notes (Signed)
   01/26/21 2041  Therapy Vitals  Pulse Rate 98  Resp 18  Patient Position (if appropriate) Lying  MEWS Score/Color  MEWS Score 0  MEWS Score Color Green  Respiratory Assessment  Assessment Type Assess only  Respiratory Pattern Regular;Unlabored  Chest Assessment Chest expansion symmetrical  Cough Weak  Sputum Amount None  Sputum Consistency None  Sputum Specimen Source Tracheostomy tube  Oxygen Therapy/Pulse Ox  O2 Device Tracheostomy Collar  O2 Therapy Room air humidified  O2 Flow Rate (L/min) 5 L/min  FiO2 (%) 21 %  SpO2 100 %  Tracheostomy Shiley Flexible 4 mm Uncuffed  Placement Date/Time: 01/17/21 1652   Placed By: Self  Brand: Shiley Flexible  Size (mm): 4 mm  Style: Uncuffed  Status Secured  Site Assessment Clean;Dry  Site Care Dried;Cleansed;Dressing applied;Protective barrier to skin  Inner Cannula Care (S)  Cleansed/dried;Changed/new  Ties Assessment Secure;Dry;Clean  Cuff Pressure (cm H2O)  (cfs)  Tracheostomy Equipment at bedside Yes and checklist posted at head of bed  Respiratory  Airway LDA Tracheostomy  Pt. Has a weak cough when suctioned

## 2021-01-27 LAB — COMPREHENSIVE METABOLIC PANEL
ALT: 34 U/L (ref 0–44)
AST: 27 U/L (ref 15–41)
Albumin: 3.2 g/dL — ABNORMAL LOW (ref 3.5–5.0)
Alkaline Phosphatase: 79 U/L (ref 38–126)
Anion gap: 8 (ref 5–15)
BUN: 14 mg/dL (ref 6–20)
CO2: 29 mmol/L (ref 22–32)
Calcium: 9.4 mg/dL (ref 8.9–10.3)
Chloride: 101 mmol/L (ref 98–111)
Creatinine, Ser: 0.6 mg/dL (ref 0.44–1.00)
GFR, Estimated: >60 mL/min (ref >60)
Glucose, Bld: 149 mg/dL — ABNORMAL HIGH (ref 70–99)
Potassium: 3.3 mmol/L — ABNORMAL LOW (ref 3.5–5.1)
Sodium: 138 mmol/L (ref 135–145)
Total Bilirubin: 0.3 mg/dL (ref 0.3–1.2)
Total Protein: 6.7 g/dL (ref 6.5–8.1)

## 2021-01-27 LAB — GLUCOSE, CAPILLARY
Glucose-Capillary: 108 mg/dL — ABNORMAL HIGH (ref 70–99)
Glucose-Capillary: 90 mg/dL (ref 70–99)
Glucose-Capillary: 118 mg/dL — ABNORMAL HIGH (ref 70–99)
Glucose-Capillary: 106 mg/dL — ABNORMAL HIGH (ref 70–99)
Glucose-Capillary: 110 mg/dL — ABNORMAL HIGH (ref 70–99)
Glucose-Capillary: 162 mg/dL — ABNORMAL HIGH (ref 70–99)

## 2021-01-27 LAB — CBC WITH DIFFERENTIAL/PLATELET
Abs Immature Granulocytes: 0.03 10*3/uL (ref 0.00–0.07)
Basophils Absolute: 0 10*3/uL (ref 0.0–0.1)
Basophils Relative: 1 %
Eosinophils Absolute: 0.1 10*3/uL (ref 0.0–0.5)
Eosinophils Relative: 1 %
HCT: 32.6 % — ABNORMAL LOW (ref 36.0–46.0)
Hemoglobin: 10.5 g/dL — ABNORMAL LOW (ref 12.0–15.0)
Immature Granulocytes: 1 %
Lymphocytes Relative: 35 %
Lymphs Abs: 1.9 10*3/uL (ref 0.7–4.0)
MCH: 25 pg — ABNORMAL LOW (ref 26.0–34.0)
MCHC: 32.2 g/dL (ref 30.0–36.0)
MCV: 77.6 fL — ABNORMAL LOW (ref 80.0–100.0)
Monocytes Absolute: 0.3 10*3/uL (ref 0.1–1.0)
Monocytes Relative: 6 %
Neutro Abs: 3.2 10*3/uL (ref 1.7–7.7)
Neutrophils Relative %: 56 %
Platelets: 316 10*3/uL (ref 150–400)
RBC: 4.2 MIL/uL (ref 3.87–5.11)
RDW: 15.1 % (ref 11.5–15.5)
WBC: 5.5 10*3/uL (ref 4.0–10.5)
nRBC: 0 % (ref 0.0–0.2)

## 2021-01-27 MED ORDER — ADULT MULTIVITAMIN W/MINERALS CH
1.0000 | ORAL_TABLET | Freq: Every day | ORAL | Status: DC
  Administered 2021-01-27 – 2021-02-12 (×17): 1 via ORAL
  Filled 2021-01-27 (×17): qty 1

## 2021-01-27 MED ORDER — ENSURE ENLIVE PO LIQD
237.0000 mL | Freq: Three times a day (TID) | ORAL | Status: DC
  Administered 2021-01-27 – 2021-02-04 (×20): 237 mL via ORAL

## 2021-01-27 NOTE — Progress Notes (Signed)
  Speech Language Pathology Treatment: Dysphagia  Patient Details Name: Charlene [name] MRN: [id number] DOB: [birth year] Today's Date: Charlene [name] Time: 1696-7893 SLP Time Calculation (min) (ACUTE ONLY): 19 min  Assessment / Plan / Recommendation Clinical Impression  Pt alert and interactive.  Repositioned in bed to optimize participation. PMV placed -  pt achieved low volume voicing with attempts to speak, but volume was quite low and it was difficult to understand her. Using LUE she held cup with straw and drank 4 oz over several minutes with no s/s of aspiration.  When offered yogurt, she reached for loaded spoon, turned it toward clinician's mouth and moved it gently forward in an effort to feed me.  She began smiling and laughing.  Pt followed simple commands in the context of feeding and voicing.    PMV was left in place upon departure and pt was repositioned for comfort. SLP will continue to follow for swallowing/speech/cognition.   HPI HPI: Pt is 55 y/o female presents to [facility] on 2/9 with L sided weakness and headaches. PMH includes anxiety, HTN. 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 S/p Right frontal ventricular catheter placement on 2/10, stopped functioning on 2/11.  MRI 2/11 shows right greater than left mesial temporal lobes / parahippocampal cortex, bilateral basal ganglia, and possibly the upper pons that are concerning for acute infarcts, most likely secondary to mass effect from the hemorrhage. ETT 2/10; trach/PEG 09/10/20.  Trach changed to #6 cuffless 3/11; #4 cuffless 5/4. Has had two MBS studies, the last on 7/8 - pt started on dysphagia 1, thin liquids.      SLP Plan  Continue with current plan of care       Recommendations  Diet recommendations: Dysphagia 1 (puree);Thin liquid Liquids provided via: Straw Medication  Administration: Via alternative means Supervision: Staff to assist with self feeding;Trained caregiver to feed patient;Full supervision/cueing for compensatory strategies Compensations: Minimize environmental distractions Postural Changes and/or Swallow Maneuvers: Seated upright 90 degrees      Patient may use Passy-Muir Speech Valve: During all waking hours (remove during sleep);During PO intake/meals PMSV Supervision: Intermittent         Oral Care Recommendations: Oral care BID Follow up Recommendations: Skilled Nursing facility SLP Visit Diagnosis: Dysphagia, oropharyngeal phase (R13.12) Plan: Continue with current plan of care       GO               Charlene [name] L. Samson [name], MA CCC/SLP Acute Rehabilitation Services Office number [phone] Pager [phone]  Charlene [name] Charlene [name] Charlene [name], 1:08 PM

## 2021-01-27 NOTE — TOC Progression Note (Signed)
Transition of Care [facility]) - Progression Note    Patient Details  Name: Charlene [name] MRN: [id number] Date of Birth: 12/10/1965  Transition of Care [facility]) CM/SW Contact  Charlene Bridgeman, RN Phone Number: 01/27/2021, 1:06 PM  Clinical Narrative:    Case management spoke with Charlene [name], MSW and the patient's [facility] court date has been delayed at this time.  The patient has a bed offer for admission to [facility] in [place], [place] but the family is unwilling to agree to admission since the facility is a quite a driving distance from [place] and family is requesting a closer facility if possible at this time.   I spoke with Charlene [name], CM at [facility] and they are unable to offer a bed offer to the patient due to her complex health needs at this time.  I spoke with Charlene [name], CM at [facility] and she is checking to see if they have bed availability and will return my call this week.  I called and spoke with Charlene [name], CM at [facility] and she is currently reviewing the patient's clinicals for possible admission.  Initial clinicals and therapy notes were sent in the hub to the facility today.  She plans to review notes and return my call regarding this matter.  [name], Charlene [name] is aware that I will continue to explore admission opportunities for other possible SNF facilities at this time.  CM and MSW on DTP Team will continue to follow the patient for SNF facilities for admission.   Expected Discharge Plan: Skilled Nursing Facility Barriers to Discharge: Continued Medical Work up  Expected Discharge Plan and Services Expected Discharge Plan: Skilled Nursing Facility In-house Referral: Clinical Social Work Discharge Planning Services: CM Consult Post Acute Care Choice: Skilled Nursing Facility Living arrangements for the past 2 months: Single Family Home                                       Social Determinants of  Health (SDOH) Interventions    Readmission Risk Interventions Readmission Risk Prevention Plan 09/29/2020  Transportation Screening Complete  PCP or Specialist Appt within 3-5 Days Complete  HRI or Home Care Consult Complete  Social Work Consult for Recovery Care Planning/Counseling Complete  Palliative Care Screening Complete  Medication Review Oceanographer) Complete  Some recent data might be hidden

## 2021-01-27 NOTE — Progress Notes (Signed)
Nutrition Follow-up  DOCUMENTATION CODES:  Not applicable  INTERVENTION:  -Recommend discontinuing TF orders/Prosource TF given pt with adequate PO intake -Continue Ensure Enlive TID, each supplement provides 350 kcal and 20 grams of protein -MVI with minerals daily  NUTRITION DIAGNOSIS:  Inadequate oral intake related to inability to eat as evidenced by NPO status. 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  Subsequently on 2/10, pt developed obtundation and respiratory arrest leading to intubation. Pt is now s/p trach/PEG placement. Hospital course complicated by Serratia pneumonia, ESBL Enterobacter UTI and pre-orbital cellulitis.   2/10 s/p EVD placement and intubation for respiratory arrest 2/13 EVD removed 2/17 palliative care consult; family requesting tx to [facility]  2/24 s/p trach and PEG  3/1 tx to [facility] 3/11 trach changed to Startup #6 cuffless 4/08 pt w/ sepsis 2/2 UTI 4/18 28% ATC/5L 5/2 CIR reevaluated pt and determined she has not made enough improvement for admit; recommended SNF 5/8 trach changed to shiley #4 cuffless  5/23 CCM evaluated and determined pt stilll unable to decannulate due to variable mental status 6/3 trach exchanged, still shiley #4 cuffless 6/30 s/p R knee aspiration and injection 7/3 trach exchanged, still shiley #4 cuffless 7/8 diet advanced to dysphagia 1 with thin liquids   Pt is still not a candidate for decannulation. Discussed pt with RN who reports pt has had great PO intake since having diet advanced. Meal completions  charted as 50-100% x 6 recorded meals (~82% average meal intake). Pt with orders for Ensure TID already.  Recommend discontinuing tube feeding at this time given adequate PO intake.   UOP: 798m + 6x unmeasured occurrences x24 hours Stool: 3x unmeasured occurrences x24 hours  Admit weight: 61.2 kg  Current weight: 63.8 kg  Medications: Scheduled Meds:  acetaminophen  650 mg Per Tube Q6H   amLODipine  10 mg Per Tube Daily   atorvastatin  40 mg Per Tube Daily   baclofen  10 mg Per Tube 3 times per day   celecoxib  200 mg Per Tube BID   chlorhexidine gluconate (MEDLINE KIT)  15 mL Mouth Rinse BID   Chlorhexidine Gluconate Cloth  6 each Topical Daily   diclofenac Sodium  4 g Topical QID   doxazosin  2 mg Per Tube Daily   feeding supplement  237 mL Oral TID BM   feeding supplement (PROSource TF)  45 mL Per Tube Daily   fiber  1 packet Per Tube BID   free water  200 mL Per Tube Q4H   hydrALAZINE  37.5 mg Per Tube Q8H   insulin aspart  0-9 Units Subcutaneous Q4H   magic mouthwash w/lidocaine  10 mL Oral QID   mouth rinse  15 mL Mouth Rinse 5 X Daily   metoprolol tartrate  100 mg Per Tube BID   modafinil  200 mg Per Tube Daily   multivitamin with minerals  1 tablet Oral Daily   pantoprazole sodium  40 mg Per Tube Daily   tiZANidine  4 mg Per Tube QHS   traMADol  25 mg Per Tube Q6H   Labs: Recent Labs  Lab 01/21/21 0142 01/27/21 0905  NA 138 138  K 4.1 3.3*  CL 103 101  CO2 27 29  BUN 17 14  CREATININE 0.71 0.60  CALCIUM 9.2 9.4  GLUCOSE 94 149*  CBGs 108-90-118  Diet Order:   Diet Order             DIET - DYS 1 Room service appropriate? No; Fluid consistency: Thin  Diet effective now                  EDUCATION NEEDS:  No education needs have been identified at this time  Skin:  Skin Assessment: Reviewed RN Assessment  Last BM:  7/12 type 4  Height:  Ht Readings from Last 1 Encounters:  10/18/20 _0  (1.651 m)   Weight:  Wt Readings from Last 1  Encounters:  01/26/21 63.8 kg   Ideal Body Weight:  56.8 kg  BMI:  Body mass index is 23.41 kg/m.  Estimated Nutritional Needs:  Kcal:  1600-1800 Protein:  80-90 grams Fluid:  >1.6L/d    Larkin Ina, MS, RD, LDN RD pager number and weekend/on-call pager number located in [place].

## 2021-01-28 LAB — GLUCOSE, CAPILLARY
Glucose-Capillary: 101 mg/dL — ABNORMAL HIGH (ref 70–99)
Glucose-Capillary: 101 mg/dL — ABNORMAL HIGH (ref 70–99)
Glucose-Capillary: 116 mg/dL — ABNORMAL HIGH (ref 70–99)
Glucose-Capillary: 117 mg/dL — ABNORMAL HIGH (ref 70–99)
Glucose-Capillary: 121 mg/dL — ABNORMAL HIGH (ref 70–99)
Glucose-Capillary: 128 mg/dL — ABNORMAL HIGH (ref 70–99)
Glucose-Capillary: 96 mg/dL (ref 70–99)

## 2021-01-28 NOTE — Progress Notes (Signed)
PROGRESS NOTE  Charlene [name] KDX:[id number] DOB: 12/02/1965   PCP: Patient, [name] (Inactive)  Patient is from: Home  DOA: 08/26/2020 LOS: 29  Chief complaints:  Chief Complaint  Patient presents with   Code Stroke     Brief Narrative / Interim history: 55 yo F with 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  Subsequently on 2/10, she developed obtundation and respiratory arrest leading to intubation. Hypertonic saline d/c'd. EVD not working, and removed on 2/13. She has remained persistently obtunded, now has a trach and a PEG.    Eventually started on provigil with improvements in alertness.  Evaluated by rehab physician who recommended increasing provigil dose further.  Subsequently patient has become more alert.  Most days she is able to track and with encouragement able to follow simple commands especially since partial obstruction glasses placed on left eye.  See progress note dictated 4/27 for expanded details.  Hospital course complicated by persistent fever, serratia pneumonia and now pre-orbital cellulitis for which she completed treatment. Medically stable for discharge once SNF with trach bed available.   Subjective: Seen and examined earlier this morning.  No major events overnight of this morning.  Patient is awake and alert but does not follow command or verbalize.  Does not appear to be in distress.  [name] at bedside attending to the patient.  Objective: Vitals:   01/28/21 0807 01/28/21 0901 01/28/21 1125 01/28/21 1231  BP: (!) 153/92 (!) 153/92  132/88  Pulse: 86 89 84 76  Resp: 14 18 (!) 22 13  Temp: 98.6 F (37 C)   98.6 F (37 C)  TempSrc: Oral   Axillary  SpO2: 96% 96% 99% 99%  Weight:      Height:        Intake/Output Summary (Last 24 hours) at 01/28/2021 1611 Last data filed at 01/28/2021 0912 Gross per 24 hour  Intake 840 ml  Output 800 ml  Net 40 ml    Filed Weights   01/23/21 0500 01/26/21 0500 01/28/21 0500  Weight: 61.5 kg 63.8 kg 62.5 kg    Examination:  GENERAL: No apparent distress.  Nontoxic. HEENT: MMM.  Seems to have left neglect and diminished hearing in left ear. NECK: Tracheostomy in place. RESP:  No IWOB.  Fair aeration bilaterally. CVS:  RRR. Heart sounds normal.  ABD/GI/GU: BS+. Abd soft, NTND.  MSK/EXT: Contractures in extremities.  Significant muscle mass and subcu fat loss. SKIN: no apparent skin lesion or wound NEURO: Awake and alert.  Does not follow command.  Not able to assess orientation.  Significant contractures in all extremities. PSYCH: Calm. Normal affect.   Significant Hospital events:  2/09 Admit for ICH 2/10 EVD placed, later intubated due to obtundation and respiratory arrest 2/11 MRI Brain, hypertonic saline d/c'd, EVD not working.  Husband did not consent to PICC 2/13 EVD removed 2/17 [place] discussion with family, PMT 2/24 tracheostomy placed by Dr. Lynetta Mare, PEG placed by Dr. Kieth Brightly 3/11 trach changed to Shiley 6 uncuffed 3/21 sputum culture with Serratia resistant to Ancef 4/18 28% ATC / 5L 5/08 downsized trach to 4  Microbiology summarized: 2/9 COVID + Influenza negative 2/10 MRSA PCR negative 2/12 BCx 2 > no growth 2/19 BC 1/2 +Stap EPI, 1/2 no growth  10/07/2020 sputum positive for Serratia marcescens 4/8 Enterobacter cloaca + urine culture MDR sensitive only to imipenem and gentamicin  Assessment & Plan: Nontraumatic TBI/right thalamic ICH with IVH Obstructive hydrocephalus  Multiple acute strokes involving corona radiata, right BG and midbrain likely from [place] Acute metabolic encephalopathy -Continue baclofen 3 times daily and tizanidine at bedtime for spasticity.  -Continue Provigil per PM&R -Continues to work well and consistently with therapies.   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 ESBL positive Enterobacter UTI on 4/8-completed 5 days of meropenem Klebsiella UTI on 5/20 and 7/4-completed antibiotic course with Septra on 7/7 per ID rec -Contact isolation during hospitalization   Rhythmic activity of right side: EEG negative for seizure activity   Colonic Ileus/recurrent constipation//diarrhea: Resolved   Acute dental pain: CT maxillofacial without evidence of overt infection or abscess -Continue Orajel QID and Magic mouthwash with lidocaine for symptom   Recurrent right knee pain/known ligamentous injury and chronic osteoarthritis -s/p injection of Marcaine and Depomedrol 6/30 -Continue scheduled Celebrex, Tylenol and Ultram   Hypertensive emergency/Malignant HTN: Normotensive. -Continue Norvasc, hydralazine and metoprolol   Dysphagia-advance to dysphagia 1 diet by SLP -Begin bolus tube feeds between meals with Ensure and Prosource   History of urinary retention -Continue Cardura   Right upper extremity thrombophlebitis: [name] -> age indeterminate SVT of right cephalic vein -On SCD for VTE prophylaxis given significant ICH   Bacterial Conjunctivitis /periorbital cellulitis: Resolved   Chronic pansinusitis/Aspiration pneumonia/Hospital Acquired Pneumonia:  -Completed multiple rounds of antibiotics   Anemia: H&H stable stable Recent Labs    11/15/20 0150 11/26/20 0906 12/04/20 1015 12/07/20 0451 12/14/20 0522 12/18/20 1014 12/19/20 0325 01/16/21 0702 01/21/21 0142 01/27/21 0905  HGB 10.1* 10.8* 10.6* 10.5* 10.5* 11.0* 10.9* 10.6* 10.7* 10.5*  -Monitor intermittently  Inadequate oral intake Body mass index is 22.93 kg/m. Nutrition Problem: Inadequate oral intake Etiology: inability to eat Signs/Symptoms: NPO status Interventions: Tube feeding   DVT prophylaxis:  SCD's Start: 08/27/20  0026  Code Status: Full code Family Communication: Patient and/or RN.  Updated patient's mother at bedside Level of care: Telemetry Medical Status is: Inpatient  Remains inpatient appropriate because:Unsafe d/c plan  Dispo: The patient is from: Home              Anticipated d/c is to: SNF              Patient currently is medically stable to d/c.   Difficult to place patient No       Consultants:  Neurology Pulmonology PM&R Palliative medicine   Sch Meds:  Scheduled Meds:  acetaminophen  650 mg Per Tube Q6H   amLODipine  10 mg Per Tube Daily   atorvastatin  40 mg Per Tube Daily   baclofen  10 mg Per Tube 3 times per day   celecoxib  200 mg Per Tube BID   chlorhexidine gluconate (MEDLINE KIT)  15 mL Mouth Rinse BID   Chlorhexidine Gluconate Cloth  6 each Topical Daily   diclofenac Sodium  4 g Topical QID   doxazosin  2 mg Per Tube Daily   feeding supplement  237 mL Oral TID BM   feeding supplement (PROSource TF)  45 mL Per Tube Daily   fiber  1 packet Per Tube BID   free water  200 mL Per Tube Q4H   hydrALAZINE  37.5 mg Per Tube Q8H   insulin aspart  0-9 Units Subcutaneous Q4H   magic mouthwash w/lidocaine  10 mL Oral QID  mouth rinse  15 mL Mouth Rinse 5 X Daily   metoprolol tartrate  100 mg Per Tube BID   modafinil  200 mg Per Tube Daily   multivitamin with minerals  1 tablet Oral Daily   pantoprazole sodium  40 mg Per Tube Daily   tiZANidine  4 mg Per Tube QHS   traMADol  25 mg Per Tube Q6H   Continuous Infusions:  sodium chloride Stopped (10/26/20 1445)   PRN Meds:.sodium chloride, benzocaine, labetalol, polyethylene glycol  Antimicrobials: Anti-infectives (From admission, onward)    Start     Dose/Rate Route Frequency Ordered Stop   01/20/21 1015  sulfamethoxazole-trimethoprim (BACTRIM) 200-40 MG/5ML suspension 20 mL        20 mL Per Tube Every 12 hours 01/20/21 0918 01/20/21 2210   01/20/21 0900  meropenem (MERREM) 1 g in sodium chloride 0.9 % 100  mL IVPB  Status:  Discontinued        1 g 200 mL/hr over 30 Minutes Intravenous Every 8 hours 01/20/21 0815 01/20/21 0917   01/18/21 1145  sulfamethoxazole-trimethoprim (BACTRIM) 200-40 MG/5ML suspension 20 mL  Status:  Discontinued        20 mL Per Tube Every 12 hours 01/18/21 1052 01/20/21 0742   01/18/21 1115  ciprofloxacin (CIPRO) 500 MG/5ML (10%) suspension 500 mg  Status:  Discontinued        500 mg Oral 2 times daily 01/18/21 1016 01/18/21 1051   12/19/20 1430  cefTRIAXone (ROCEPHIN) 1 g in sodium chloride 0.9 % 100 mL IVPB        1 g 200 mL/hr over 30 Minutes Intravenous Every 24 hours 12/19/20 1233 12/25/20 1711   12/18/20 1400  meropenem (MERREM) 1 g in sodium chloride 0.9 % 100 mL IVPB  Status:  Discontinued        1 g 200 mL/hr over 30 Minutes Intravenous Every 8 hours 12/18/20 1255 12/19/20 1233   12/06/20 1500  cefTRIAXone (ROCEPHIN) 1 g in sodium chloride 0.9 % 100 mL IVPB        1 g 200 mL/hr over 30 Minutes Intravenous Every 24 hours 12/06/20 1404 12/10/20 1500   12/04/20 1400  meropenem (MERREM) 1 g in sodium chloride 0.9 % 100 mL IVPB  Status:  Discontinued        1 g 200 mL/hr over 30 Minutes Intravenous Every 8 hours 12/04/20 1207 12/06/20 1404   10/27/20 0845  meropenem (MERREM) 1 g in sodium chloride 0.9 % 100 mL IVPB  Status:  Discontinued        1 g 200 mL/hr over 30 Minutes Intravenous Every 8 hours 10/27/20 0752 11/01/20 0720   10/26/20 1145  cefTRIAXone (ROCEPHIN) 1 g in sodium chloride 0.9 % 100 mL IVPB  Status:  Discontinued        1 g 200 mL/hr over 30 Minutes Intravenous Every 24 hours 10/26/20 1056 10/27/20 0752   10/14/20 1815  Ampicillin-Sulbactam (UNASYN) 3 g in sodium chloride 0.9 % 100 mL IVPB        3 g 200 mL/hr over 30 Minutes Intravenous Every 6 hours 10/14/20 1754 10/19/20 1340   10/08/20 0845  ciprofloxacin (CIPRO) tablet 500 mg  Status:  Discontinued        500 mg Per Tube 2 times daily 10/08/20 0754 10/14/20 1731   09/21/20 1545  ceFAZolin  (ANCEF) IVPB 1 g/50 mL premix        1 g 100 mL/hr over 30 Minutes Intravenous Every 8 hours 09/21/20  1457 09/28/20 0647   09/20/20 0600  ceFAZolin (ANCEF) IVPB 1 g/50 mL premix  Status:  Discontinued        1 g 100 mL/hr over 30 Minutes Intravenous Every 8 hours 09/14/20 0903 09/14/20 0904   09/16/20 1300  vancomycin (VANCOREADY) IVPB 1250 mg/250 mL  Status:  Discontinued        1,250 mg 166.7 mL/hr over 90 Minutes Intravenous Every 24 hours 09/15/20 1232 09/17/20 0956   09/15/20 1230  vancomycin (VANCOREADY) IVPB 1250 mg/250 mL        1,250 mg 166.7 mL/hr over 90 Minutes Intravenous  Once 09/15/20 1131 09/15/20 1402   09/13/20 1400  ceFEPIme (MAXIPIME) 2 g in sodium chloride 0.9 % 100 mL IVPB        2 g 200 mL/hr over 30 Minutes Intravenous Every 8 hours 09/13/20 1341 09/19/20 2208   09/06/20 0600  vancomycin (VANCOREADY) IVPB 1250 mg/250 mL        1,250 mg 166.7 mL/hr over 90 Minutes Intravenous  Once 09/06/20 0541 09/06/20 0739   09/05/20 1400  ceFAZolin (ANCEF) IVPB 1 g/50 mL premix  Status:  Discontinued        1 g 100 mL/hr over 30 Minutes Intravenous Every 8 hours 09/05/20 1120 09/13/20 1341   08/30/20 0600  vancomycin (VANCOREADY) IVPB 500 mg/100 mL  Status:  Discontinued        500 mg 100 mL/hr over 60 Minutes Intravenous Every 12 hours 08/29/20 1554 09/01/20 1115   08/29/20 1700  ceFEPIme (MAXIPIME) 2 g in sodium chloride 0.9 % 100 mL IVPB  Status:  Discontinued        2 g 200 mL/hr over 30 Minutes Intravenous Every 12 hours 08/29/20 1554 09/01/20 1115   08/29/20 1645  vancomycin (VANCOREADY) IVPB 1250 mg/250 mL        1,250 mg 166.7 mL/hr over 90 Minutes Intravenous  Once 08/29/20 1554 08/29/20 1820        I have personally reviewed the following labs and images: CBC: Recent Labs  Lab 01/27/21 0905  WBC 5.5  NEUTROABS 3.2  HGB 10.5*  HCT 32.6*  MCV 77.6*  PLT 316   BMP &GFR Recent Labs  Lab 01/27/21 0905  NA 138  K 3.3*  CL 101  CO2 29  GLUCOSE 149*   BUN 14  CREATININE 0.60  CALCIUM 9.4   Estimated Creatinine Clearance: 73.2 mL/min (by C-G formula based on SCr of 0.6 mg/dL). Liver & Pancreas: Recent Labs  Lab 01/27/21 0905  AST 27  ALT 34  ALKPHOS 79  BILITOT 0.3  PROT 6.7  ALBUMIN 3.2*   No results for input(s): LIPASE, AMYLASE in the last 168 hours. No results for input(s): AMMONIA in the last 168 hours. Diabetic: No results for input(s): HGBA1C in the last 72 hours. Recent Labs  Lab 01/27/21 2320 01/28/21 0345 01/28/21 0454 01/28/21 0910 01/28/21 1320  GLUCAP 162* 101* 101* 96 128*   Cardiac Enzymes: No results for input(s): CKTOTAL, CKMB, CKMBINDEX, TROPONINI in the last 168 hours. No results for input(s): PROBNP in the last 8760 hours. Coagulation Profile: No results for input(s): INR, PROTIME in the last 168 hours. Thyroid Function Tests: No results for input(s): TSH, T4TOTAL, FREET4, T3FREE, THYROIDAB in the last 72 hours. Lipid Profile: No results for input(s): CHOL, HDL, LDLCALC, TRIG, CHOLHDL, LDLDIRECT in the last 72 hours. Anemia Panel: No results for input(s): VITAMINB12, FOLATE, FERRITIN, TIBC, IRON, RETICCTPCT in the last 72 hours. Urine analysis:  Component Value Date/Time   COLORURINE STRAW (A) 01/16/2021 1741   APPEARANCEUR CLEAR 01/16/2021 1741   LABSPEC 1.004 (L) 01/16/2021 1741   PHURINE 8.0 01/16/2021 1741   GLUCOSEU NEGATIVE 01/16/2021 1741   HGBUR NEGATIVE 01/16/2021 1741   BILIRUBINUR NEGATIVE 01/16/2021 1741   KETONESUR NEGATIVE 01/16/2021 1741   PROTEINUR NEGATIVE 01/16/2021 1741   UROBILINOGEN 0.2 02/21/2014 1629   NITRITE NEGATIVE 01/16/2021 1741   LEUKOCYTESUR NEGATIVE 01/16/2021 1741   Sepsis Labs: Invalid input(s): PROCALCITONIN, [place]  Microbiology: No results found for this or any previous visit (from the past 240 hour(s)).  Radiology Studies: No results found.    Shreena Baines T. [place]  If 7PM-7AM, please contact  night-coverage [email or web address] 01/28/2021, 4:11 PM

## 2021-01-28 NOTE — Progress Notes (Signed)
   01/28/21 0330  Therapy Vitals  Pulse Rate 83  Resp 18  Patient Position (if appropriate) Lying  MEWS Score/Color  MEWS Score 0  MEWS Score Color Green  Respiratory Assessment  Assessment Type Assess only  Respiratory Pattern Regular;Unlabored  Chest Assessment Chest expansion symmetrical  Cough Weak;Strong  Sputum Amount None  Sputum Consistency None  Sputum Specimen Source Tracheostomy tube  R Upper  Breath Sounds Clear  Oxygen Therapy/Pulse Ox  O2 Device Tracheostomy Collar  O2 Therapy Room air humidified  O2 Flow Rate (L/min) 5 L/min  FiO2 (%) 21 %  SpO2 98 %  Tracheostomy Shiley Flexible 4 mm Uncuffed  Placement Date/Time: 01/17/21 1652   Placed By: Self  Brand: Shiley Flexible  Size (mm): 4 mm  Style: Uncuffed  Status Passy Mu[place] Speaking valve;Secured  Site Assessment Dry;Clean  Tracheostomy Equipment at bedside Yes and checklist posted at head of bed  Respiratory  Airway LDA Tracheostomy  Pt. Has weak to strong cough when suctioned

## 2021-01-28 NOTE — TOC Progression Note (Addendum)
Transition of Care [facility]) - Progression Note    Patient Details  Name: Charlene [name] MRN: [id number] Date of Birth: Dec 07, 1965  Transition of [facility]) CM/SW Contact  Janae Bridgeman, RN Phone Number: 01/28/2021, 8:05 AM  Clinical Narrative:    Case management called and left a message with Sullivan Lone, CM at [facility] to determine if the facility is able to offer an admission bed to the patient after reviewing the patient's clinicals and therapy notes on 01/27/2021.  No bed offers at this time outside of [facility] in [place], [place].  01/28/2021 1139 - [facility] SNF is continuing to review the patient for admission.  FL2 updated and remains current.  MSW and CM with DTP Team continue to follow the patient for court hearing today and SNF placement as needed for disposition.   Expected Discharge Plan: Skilled Nursing Facility Barriers to Discharge: Continued Medical Work up  Expected Discharge Plan and Services Expected Discharge Plan: Skilled Nursing Facility In-house Referral: Clinical Social Work Discharge Planning Services: CM Consult Post Acute Care Choice: Skilled Nursing Facility Living arrangements for the past 2 months: Single Family Home                                       Social Determinants of Health (SDOH) Interventions    Readmission Risk Interventions Readmission Risk Prevention Plan 09/29/2020  Transportation Screening Complete  PCP or Specialist Appt within 3-5 Days Complete  HRI or Home Care Consult Complete  Social Work Consult for Recovery Care Planning/Counseling Complete  Palliative Care Screening Complete  Medication Review Oceanographer) Complete  Some recent data might be hidden

## 2021-01-28 NOTE — Progress Notes (Addendum)
CSW spoke with [facility] legal counsel regarding today's court hearing.  Edwin Dada, MSW, LCSW Transitions of Care  Clinical Social Worker II [phone]

## 2021-01-28 NOTE — Progress Notes (Signed)
Physical Therapy Treatment Patient Details Name: Charlene [name] MRN: [id number] DOB: [birth year] Today's Date: 01/28/2021    History of Present Illness Pt is 55 y/o female presents to [facility] on 08/26/20 with L sided weakness and headaches. 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 s/p Right frontal ventricular catheter placement on 2/10, stopped functioning on 2/11.  MRI 2/11 shows right greater than left mesial temporal lobes / parahippocampal cortex, bilateral basal ganglia, and possibly the upper pons that are concerning for acute infarcts, most likely secondary to mass effect from the hemorrhage. ETT 2/10-2/24.  Pt with trach and PEG on 09/10/20. Hospital course complicated by persistent fever, Serratia pneumonia and now pre-orbital cellulitis.   PMH includes anxiety, HTN.    PT Comments    Worked in conjunction with OT on bed mobility and EOB activities.  Pt seemed fatigued today and mother, [name] reporting that she finished eating not too long ago and had her morning meds which may cause some increased fatigue.  Pt was able to initiate some movement in rolling and transitions from side lying to sitting with assist, but remains two person max to total assist.  Her tone in bil LEs continues to be a barrier to using the tilt bed.  PT will continue to follow acutely for safe mobility progression   Follow Up Recommendations  SNF     Equipment Recommendations  Wheelchair (measurements PT);Wheelchair cushion (measurements PT);Hospital bed (tilt in space WC, hospital bed with air mattress overlay)    Recommendations for Other Services       Precautions / Restrictions Precautions Precautions: Fall Precaution Comments: peg and trach Required Braces or Orthoses: Other Brace Other Brace: bil. PRAFOs, soft R knee extension splint and soft L elbow extension  splint    Mobility  Bed Mobility Overal bed mobility: Needs Assistance Bed Mobility: Rolling Rolling: Total assist;+2 for physical assistance;Max assist (rolling left max, rolling R total) Sidelying to sit: +2 for physical assistance;Max assist     Sit to sidelying: +2 for physical assistance;Max assist General bed mobility comments: Attempting to get pt to assist with rolling, transitions to sitting and back to side lying.  There is some initiation of reaching mostly with R arm, but not significant.    Transfers                    Ambulation/Gait                 Stairs             Wheelchair Mobility    Modified Rankin (Stroke Patients Only)       Balance Overall balance assessment: Needs assistance Sitting-balance support: Feet unsupported;Single extremity supported Sitting balance-Leahy Scale: Poor Sitting balance - Comments: Sat EOB working on command following, sitting balance, balance reactions, down on alt elbows, and trunk rotation bil. Postural control: Posterior lean;Left lateral lean                                  Cognition Arousal/Alertness: Lethargic;Awake/alert (some lethargy, seems tired) Behavior During Therapy: Flat affect Overall Cognitive Status: Impaired/Different from baseline Area of Impairment: Attention;Following commands                   Current Attention Level: Sustained   Following Commands: Follows  one step commands inconsistently;Follows one step commands with increased time       General Comments: Pt appeared fatigued today, decreased vocalization compared to previous sessions, less responsive to commands as well, very slow processing.      Exercises Other Exercises Other Exercises: PROM to bil knee extension, abduction, ankle DF, AAROM to right UE elbow flexion, shoulder flexion, PROM to L UE elbow extension, shoulder elevation, shoulder abduction all in supine before mobilizing EOB.     General Comments General comments (skin integrity, edema, etc.): Pt about to start a BM, so left on bed pan with instructions to mother to call RN tech in ~15 mins to check to see if she finished.  Did not get to [facility] due to likely going to start a BM.      Pertinent Vitals/Pain Pain Assessment: Faces Faces Pain Scale: Hurts little more Pain Location: B knees, R worse than L Pain Descriptors / Indicators: Grimacing;Guarding Pain Intervention(s): Limited activity within patient's tolerance;Monitored during session;Repositioned    Home Living                      Prior Function            PT Goals (current goals can now be found in the care plan section) Progress towards PT goals: Progressing toward goals    Frequency    Min 2X/week      PT Plan Current plan remains appropriate    Co-evaluation PT/OT/SLP Co-Evaluation/Treatment: Yes Reason for Co-Treatment: Complexity of the patient's impairments (multi-system involvement);Necessary to address cognition/behavior during functional activity;For patient/therapist safety;To address functional/ADL transfers PT goals addressed during session: Mobility/safety with mobility;Balance;Strengthening/ROM        AM-PAC PT "6 Clicks" Mobility   Outcome Measure  Help needed turning from your back to your side while in a flat bed without using bedrails?: Total Help needed moving from lying on your back to sitting on the side of a flat bed without using bedrails?: Total Help needed moving to and from a bed to a chair (including a wheelchair)?: Total Help needed standing up from a chair using your arms (e.g., wheelchair or bedside chair)?: Total Help needed to walk in hospital room?: Total Help needed climbing 3-5 steps with a railing? : Total 6 Click Score: 6    End of Session   Activity Tolerance: Patient limited by fatigue Patient left: in bed;with call bell/phone within reach;with family/visitor present Nurse  Communication: Mobility status;Other (comment) (on bedpan to RN tech, Misty Stanley) PT Visit Diagnosis: Hemiplegia and hemiparesis;Other symptoms and signs involving the nervous system (R29.898);Other abnormalities of gait and mobility (R26.89) Hemiplegia - Right/Left: Right (bil LE, L UE) Hemiplegia - dominant/non-dominant: Dominant Hemiplegia - caused by: Cerebral infarction;Nontraumatic intracerebral hemorrhage;Other Nontraumatic intracranial hemorrhage     Time: 1112-1222 PT Time Calculation (min) (ACUTE ONLY): 70 min  Charges:  $Therapeutic Exercise: 8-22 mins $Therapeutic Activity: 23-37 mins                     Corinna Capra, PT, DPT  Acute Rehabilitation Ortho Tech Supervisor [phone] pager [phone]) [phone] office

## 2021-01-28 NOTE — Progress Notes (Signed)
PROGRESS NOTE  Charlene [name] WOE:[id number] DOB: 1965-08-11   PCP: Patient, [name] (Inactive)  Patient is from: Home  DOA: 08/26/2020 LOS: 78  Chief complaints:  Chief Complaint  Patient presents with   Code Stroke     Brief Narrative / Interim history: 55 yo F with 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  Subsequently on 2/10, she developed obtundation and respiratory arrest leading to intubation. Hypertonic saline d/c'd. EVD not working, and removed on 2/13. She has remained persistently obtunded, now has a trach and a PEG.    Eventually started on provigil with improvements in alertness.  Evaluated by rehab physician who recommended increasing provigil dose further.  Subsequently patient has become more alert.  Most days she is able to track and with encouragement able to follow simple commands especially since partial obstruction glasses placed on left eye.  See progress note dictated 4/27 for expanded details.  Hospital course complicated by persistent fever, serratia pneumonia and now pre-orbital cellulitis for which she completed treatment. Medically stable for discharge once SNF with trach bed available.   Subjective: Seen and examined earlier this morning.  No major events overnight of this morning.  [name] at bedside.  No complaints.  Objective: Vitals:   01/28/21 0807 01/28/21 0901 01/28/21 1125 01/28/21 1231  BP: (!) 153/92 (!) 153/92  132/88  Pulse: 86 89 84 76  Resp: 14 18 (!) 22 13  Temp: 98.6 F (37 C)   98.6 F (37 C)  TempSrc: Oral   Axillary  SpO2: 96% 96% 99% 99%  Weight:      Height:        Intake/Output Summary (Last 24 hours) at 01/28/2021 1546 Last data filed at 01/28/2021 0912 Gross per 24 hour  Intake 840 ml  Output 800 ml  Net 40 ml   Filed Weights   01/23/21 0500 01/26/21 0500 01/28/21 0500  Weight: 61.5 kg 63.8 kg 62.5 kg     Examination:  GENERAL: No apparent distress.  Nontoxic. HEENT: MMM.  Hearing grossly intact. NECK: Tracheostomy in place. RESP: On RA.  No IWOB.  Fair aeration bilaterally. CVS:  RRR. Heart sounds normal.  ABD/GI/GU: BS+. Abd soft, NTND.  MSK/EXT: Contractures in extremities.  Significant muscle mass and subcu fat loss. SKIN: no apparent skin lesion or wound NEURO: Awake and alert.  Does not follows command.  Not able to assess orientation.  Significant contractures PSYCH: Calm.  No distress or agitation.  Significant Hospital events:  2/09 Admit for ICH 2/10 EVD placed, later intubated due to obtundation and respiratory arrest 2/11 MRI Brain, hypertonic saline d/c'd, EVD not working.  Husband did not consent to PICC 2/13 EVD removed 2/17 [place] discussion with family, PMT 2/24 tracheostomy placed by Dr. Lynetta Mare, PEG placed by Dr. Kieth Brightly 3/11 trach changed to [place]iley 6 uncuffed 3/21 sputum culture with Serratia resistant to Ancef 4/18 28% ATC / 5L 5/08 downsized trach to 4  Microbiology summarized: 2/9 COVID + Influenza negative 2/10 MRSA PCR negative 2/12 BCx 2 > no growth 2/19 BC 1/2 +Stap EPI, 1/2 no growth  10/07/2020 sputum positive for Serratia marcescens 4/8 Enterobacter cloaca + urine culture MDR sensitive only to imipenem and gentamicin  Assessment & Plan: Nontraumatic TBI/right thalamic ICH with IVH Obstructive hydrocephalus Multiple acute strokes involving corona radiata, right BG and midbrain likely from [place] Acute metabolic encephalopathy -Continue baclofen 3 times daily and tizanidine at bedtime for spasticity.  -Continue  Provigil per PM&R -Continues to work well and consistently with therapies.   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 -Continue statin -CIR recommended SNF   Tracheostomy dependent -Trach downsized to #4 on 5/8 -has not had a strong cough for decannulation yet -PCCM following intermittently    Recurrent  UTI/cystitis ESBL positive Enterobacter UTI-completed 5 days of meropenem previously Klebsiella UTI-urine culture on 7/4 with Klebsiella pneumonia -Contact isolation during hospitalization -Completed course of Septra on 7/7 for Klebsiella pneumonia UTI per ID recommendation   Rhythmic activity of right side: EEG negative for seizure activity   Colonic Ileus/recurrent constipation/loose stools: Resolved   Acute dental pain: CT maxillofacial without evidence of overt infection or abscess -Continue Orajel QID and Magic mouthwash with lidocaine for symptom   Recurrent right knee pain/known ligamentous injury and chronic osteoarthritis -s/p injection of Marcaine and Depomedrol 6/30 -Continue scheduled Celebrex, Tylenol and Ultram   Hypertensive emergency/Malignant HTN: Normotensive. -Continue Norvasc, hydralazine and metoprolol   Dysphagia-advance to dysphagia 1 diet by SLP -Begin bolus tube feeds between meals with Ensure and Prosource   History of urinary retention -Continue Cardura   Right upper extremity thrombophlebitis - [name] -> age indeterminate SVT of right cephalic vein   Recurrent diarrhea: Resolved   Bacterial Conjunctivitis /periorbital cellulitis: Resolved   Chronic pansinusitis/recurrent fevers 2/2 aspiration PNA;History of Serratia Pneumonia  Hospital Acquired Pneumonia: Completed course of antibiotic.  Resolved.   Anemia: Stable -Stable  Inadequate oral intake Body mass index is 22.93 kg/m. Nutrition Problem: Inadequate oral intake Etiology: inability to eat Signs/Symptoms: NPO status Interventions: Tube feeding   DVT prophylaxis:  SCD's Start: 08/27/20 0026  Code Status: Full code Family Communication: Patient and/or RN.  Updated patient's mother at bedside Level of care: Telemetry Medical Status is: Inpatient  Remains inpatient appropriate because:Unsafe d/c plan  Dispo: The patient is from: Home              Anticipated d/c is to: SNF               Patient currently is medically stable to d/c.   Difficult to place patient No       Consultants:  Neurology Pulmonology PM&R Palliative medicine   Sch Meds:  Scheduled Meds:  acetaminophen  650 mg Per Tube Q6H   amLODipine  10 mg Per Tube Daily   atorvastatin  40 mg Per Tube Daily   baclofen  10 mg Per Tube 3 times per day   celecoxib  200 mg Per Tube BID   chlorhexidine gluconate (MEDLINE KIT)  15 mL Mouth Rinse BID   Chlorhexidine Gluconate Cloth  6 each Topical Daily   diclofenac Sodium  4 g Topical QID   doxazosin  2 mg Per Tube Daily   feeding supplement  237 mL Oral TID BM   feeding supplement (PROSource TF)  45 mL Per Tube Daily   fiber  1 packet Per Tube BID   free water  200 mL Per Tube Q4H   hydrALAZINE  37.5 mg Per Tube Q8H   insulin aspart  0-9 Units Subcutaneous Q4H   magic mouthwash w/lidocaine  10 mL Oral QID   mouth rinse  15 mL Mouth Rinse 5 X Daily   metoprolol tartrate  100 mg Per Tube BID   modafinil  200 mg Per Tube Daily   multivitamin with minerals  1 tablet Oral Daily   pantoprazole sodium  40 mg Per Tube Daily   tiZANidine  4 mg Per Tube QHS  traMADol  25 mg Per Tube Q6H   Continuous Infusions:  sodium chloride Stopped (10/26/20 1445)   PRN Meds:.sodium chloride, benzocaine, labetalol, polyethylene glycol  Antimicrobials: Anti-infectives (From admission, onward)    Start     Dose/Rate Route Frequency Ordered Stop   01/20/21 1015  sulfamethoxazole-trimethoprim (BACTRIM) 200-40 MG/5ML suspension 20 mL        20 mL Per Tube Every 12 hours 01/20/21 0918 01/20/21 2210   01/20/21 0900  meropenem (MERREM) 1 g in sodium chloride 0.9 % 100 mL IVPB  Status:  Discontinued        1 g 200 mL/hr over 30 Minutes Intravenous Every 8 hours 01/20/21 0815 01/20/21 0917   01/18/21 1145  sulfamethoxazole-trimethoprim (BACTRIM) 200-40 MG/5ML suspension 20 mL  Status:  Discontinued        20 mL Per Tube Every 12 hours 01/18/21 1052 01/20/21 0742   01/18/21  1115  ciprofloxacin (CIPRO) 500 MG/5ML (10%) suspension 500 mg  Status:  Discontinued        500 mg Oral 2 times daily 01/18/21 1016 01/18/21 1051   12/19/20 1430  cefTRIAXone (ROCEPHIN) 1 g in sodium chloride 0.9 % 100 mL IVPB        1 g 200 mL/hr over 30 Minutes Intravenous Every 24 hours 12/19/20 1233 12/25/20 1711   12/18/20 1400  meropenem (MERREM) 1 g in sodium chloride 0.9 % 100 mL IVPB  Status:  Discontinued        1 g 200 mL/hr over 30 Minutes Intravenous Every 8 hours 12/18/20 1255 12/19/20 1233   12/06/20 1500  cefTRIAXone (ROCEPHIN) 1 g in sodium chloride 0.9 % 100 mL IVPB        1 g 200 mL/hr over 30 Minutes Intravenous Every 24 hours 12/06/20 1404 12/10/20 1500   12/04/20 1400  meropenem (MERREM) 1 g in sodium chloride 0.9 % 100 mL IVPB  Status:  Discontinued        1 g 200 mL/hr over 30 Minutes Intravenous Every 8 hours 12/04/20 1207 12/06/20 1404   10/27/20 0845  meropenem (MERREM) 1 g in sodium chloride 0.9 % 100 mL IVPB  Status:  Discontinued        1 g 200 mL/hr over 30 Minutes Intravenous Every 8 hours 10/27/20 0752 11/01/20 0720   10/26/20 1145  cefTRIAXone (ROCEPHIN) 1 g in sodium chloride 0.9 % 100 mL IVPB  Status:  Discontinued        1 g 200 mL/hr over 30 Minutes Intravenous Every 24 hours 10/26/20 1056 10/27/20 0752   10/14/20 1815  Ampicillin-Sulbactam (UNASYN) 3 g in sodium chloride 0.9 % 100 mL IVPB        3 g 200 mL/hr over 30 Minutes Intravenous Every 6 hours 10/14/20 1754 10/19/20 1340   10/08/20 0845  ciprofloxacin (CIPRO) tablet 500 mg  Status:  Discontinued        500 mg Per Tube 2 times daily 10/08/20 0754 10/14/20 1731   09/21/20 1545  ceFAZolin (ANCEF) IVPB 1 g/50 mL premix        1 g 100 mL/hr over 30 Minutes Intravenous Every 8 hours 09/21/20 1457 09/28/20 0647   09/20/20 0600  ceFAZolin (ANCEF) IVPB 1 g/50 mL premix  Status:  Discontinued        1 g 100 mL/hr over 30 Minutes Intravenous Every 8 hours 09/14/20 0903 09/14/20 0904   09/16/20 1300   vancomycin (VANCOREADY) IVPB 1250 mg/250 mL  Status:  Discontinued  1,250 mg 166.7 mL/hr over 90 Minutes Intravenous Every 24 hours 09/15/20 1232 09/17/20 0956   09/15/20 1230  vancomycin (VANCOREADY) IVPB 1250 mg/250 mL        1,250 mg 166.7 mL/hr over 90 Minutes Intravenous  Once 09/15/20 1131 09/15/20 1402   09/13/20 1400  ceFEPIme (MAXIPIME) 2 g in sodium chloride 0.9 % 100 mL IVPB        2 g 200 mL/hr over 30 Minutes Intravenous Every 8 hours 09/13/20 1341 09/19/20 2208   09/06/20 0600  vancomycin (VANCOREADY) IVPB 1250 mg/250 mL        1,250 mg 166.7 mL/hr over 90 Minutes Intravenous  Once 09/06/20 0541 09/06/20 0739   09/05/20 1400  ceFAZolin (ANCEF) IVPB 1 g/50 mL premix  Status:  Discontinued        1 g 100 mL/hr over 30 Minutes Intravenous Every 8 hours 09/05/20 1120 09/13/20 1341   08/30/20 0600  vancomycin (VANCOREADY) IVPB 500 mg/100 mL  Status:  Discontinued        500 mg 100 mL/hr over 60 Minutes Intravenous Every 12 hours 08/29/20 1554 09/01/20 1115   08/29/20 1700  ceFEPIme (MAXIPIME) 2 g in sodium chloride 0.9 % 100 mL IVPB  Status:  Discontinued        2 g 200 mL/hr over 30 Minutes Intravenous Every 12 hours 08/29/20 1554 09/01/20 1115   08/29/20 1645  vancomycin (VANCOREADY) IVPB 1250 mg/250 mL        1,250 mg 166.7 mL/hr over 90 Minutes Intravenous  Once 08/29/20 1554 08/29/20 1820        I have personally reviewed the following labs and images: CBC: Recent Labs  Lab 01/27/21 0905  WBC 5.5  NEUTROABS 3.2  HGB 10.5*  HCT 32.6*  MCV 77.6*  PLT 316   BMP &GFR Recent Labs  Lab 01/27/21 0905  NA 138  K 3.3*  CL 101  CO2 29  GLUCOSE 149*  BUN 14  CREATININE 0.60  CALCIUM 9.4   Estimated Creatinine Clearance: 73.2 mL/min (by C-G formula based on SCr of 0.6 mg/dL). Liver & Pancreas: Recent Labs  Lab 01/27/21 0905  AST 27  ALT 34  ALKPHOS 79  BILITOT 0.3  PROT 6.7  ALBUMIN 3.2*   No results for input(s): LIPASE, AMYLASE in the last  168 hours. No results for input(s): AMMONIA in the last 168 hours. Diabetic: No results for input(s): HGBA1C in the last 72 hours. Recent Labs  Lab 01/27/21 2320 01/28/21 0345 01/28/21 0454 01/28/21 0910 01/28/21 1320  GLUCAP 162* 101* 101* 96 128*   Cardiac Enzymes: No results for input(s): CKTOTAL, CKMB, CKMBINDEX, TROPONINI in the last 168 hours. No results for input(s): PROBNP in the last 8760 hours. Coagulation Profile: No results for input(s): INR, PROTIME in the last 168 hours. Thyroid Function Tests: No results for input(s): TSH, T4TOTAL, FREET4, T3FREE, THYROIDAB in the last 72 hours. Lipid Profile: No results for input(s): CHOL, HDL, LDLCALC, TRIG, CHOLHDL, LDLDIRECT in the last 72 hours. Anemia Panel: No results for input(s): VITAMINB12, FOLATE, FERRITIN, TIBC, IRON, RETICCTPCT in the last 72 hours. Urine analysis:    Component Value Date/Time   COLORURINE STRAW (A) 01/16/2021 1741   APPEARANCEUR CLEAR 01/16/2021 1741   LABSPEC 1.004 (L) 01/16/2021 1741   PHURINE 8.0 01/16/2021 1741   GLUCOSEU NEGATIVE 01/16/2021 1741   HGBUR NEGATIVE 01/16/2021 1741   BILIRUBINUR NEGATIVE 01/16/2021 1741   KETONESUR NEGATIVE 01/16/2021 1741   PROTEINUR NEGATIVE 01/16/2021 1741   UROBILINOGEN  0.2 02/21/2014 1629   NITRITE NEGATIVE 01/16/2021 1741   LEUKOCYTESUR NEGATIVE 01/16/2021 1741   Sepsis Labs: Invalid input(s): PROCALCITONIN, [place]  Microbiology: No results found for this or any previous visit (from the past 240 hour(s)).  Radiology Studies: No results found.    Coltan Spinello T. Balm  If 7PM-7AM, please contact night-coverage [email or web address] 01/28/2021, 3:46 PM

## 2021-01-28 NOTE — Progress Notes (Signed)
Occupational Therapy Treatment Patient Details Name: Charlene [name] MRN: [id number] DOB: 23-Oct-1965 Today's Date: 01/28/2021    History of present illness Pt is 55 y/o female [facility] with L sided weakness and headaches. 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 s/p Right frontal ventricular catheter placement on 2/10, stopped functioning on 2/11.  MRI 2/11 shows right greater than left mesial temporal lobes / parahippocampal cortex, bilateral basal ganglia, and possibly the upper pons that are concerning for acute infarcts, most likely secondary to mass effect from the hemorrhage. ETT 2/10-2/24.  Pt with trach and PEG on 09/10/20. Hospital course complicated by persistent fever, Serratia pneumonia and now pre-orbital cellulitis.   PMH includes anxiety, HTN.   OT comments  Pt seen in conjunction with PT.  Worked with pt on EOB sitting balance, trunk control and Rt UE function.  She was fatigued and followed one step commands intermittently.  She was able to assist in pushing her trunk into sitting position with Rt UE and max A.  She continues with increased spasticity bil. LEs.   Follow Up Recommendations  SNF;Supervision/Assistance - 24 hour    Equipment Recommendations  None recommended by OT    Recommendations for Other Services      Precautions / Restrictions Precautions Precautions: Fall Precaution Comments: peg and trach Required Braces or Orthoses: Other Brace Other Brace: bil. PRAFOs, soft R knee extension splint and soft L elbow extension splint Restrictions Weight Bearing Restrictions: No       Mobility Bed Mobility Overal bed mobility: Needs Assistance Bed Mobility: Rolling Rolling: Total assist;+2 for physical assistance;Max assist (rolling left max, rolling R total) Sidelying to sit: +2 for physical  assistance;Max assist     Sit to sidelying: +2 for physical assistance;Max assist General bed mobility comments: Attempting to get pt to assist with rolling, transitions to sitting and back to side lying.  There is some initiation of reaching mostly with R arm, but not significant.    Transfers                      Balance Overall balance assessment: Needs assistance Sitting-balance support: Feet unsupported;Single extremity supported Sitting balance-Leahy Scale: Poor Sitting balance - Comments: Sat EOB working on command following, sitting balance, balance reactions, down on alt elbows, and trunk rotation bil. Postural control: Posterior lean;Left lateral lean                                 ADL either performed or assessed with clinical judgement   ADL       Grooming: Total assistance;Sitting Grooming Details (indicate cue type and reason): requires increased time to initiate task.  She required total hand over hand assist to use yaunker to suction mouth                     Toileting- Clothing Manipulation and Hygiene: Total assistance;Bed level Toileting - Clothing Manipulation Details (indicate cue type and reason): Pt noted with small bowel movement, and was placed on bedpan with total A +2     Functional mobility during ADLs: Total assistance       Vision       Perception     Praxis      Cognition Arousal/Alertness: Lethargic;Awake/alert (some lethargy,  seems tired) Behavior During Therapy: Flat affect Overall Cognitive Status: Impaired/Different from baseline Area of Impairment: Attention;Following commands                   Current Attention Level: Sustained   Following Commands: Follows one step commands inconsistently;Follows one step commands with increased time     Problem Solving: Slow processing;Decreased initiation;Difficulty sequencing;Requires verbal cues;Requires tactile cues General Comments: Pt appeared  fatigued today, decreased vocalization compared to previous sessions, less responsive to commands as well, very slow processing.        Exercises Exercises: Other exercises Other Exercises Other Exercises: PROM to bil knee extension, abduction, ankle DF, AAROM to right UE elbow flexion, shoulder flexion, PROM to L UE elbow extension, shoulder elevation, shoulder abduction all in supine before mobilizing EOB.   Shoulder Instructions       General Comments Pt about to start a BM, so left on bed pan with instructions to mother to call RN tech in ~15 mins to check to see if she finished.  Did not get to [facility] due to likely going to start a BM. Mother present during session    Pertinent Vitals/ Pain       Pain Assessment: Faces Faces Pain Scale: Hurts little more Pain Location: B knees, R worse than L Pain Descriptors / Indicators: Grimacing;Guarding Pain Intervention(s): Monitored during session;Limited activity within patient's tolerance;Repositioned  Home Living                                          Prior Functioning/Environment              Frequency  Min 2X/week        Progress Toward Goals  OT Goals(current goals can now be found in the care plan section)  Progress towards OT goals: Not progressing toward goals - comment (fatigue)     Plan Frequency remains appropriate;Discharge plan remains appropriate    Co-evaluation    PT/OT/SLP Co-Evaluation/Treatment: Yes Reason for Co-Treatment: Complexity of the patient's impairments (multi-system involvement);Necessary to address cognition/behavior during functional activity;To address functional/ADL transfers PT goals addressed during session: Mobility/safety with mobility;Balance;Strengthening/ROM OT goals addressed during session: ADL's and self-care;Strengthening/ROM      AM-PAC OT "6 Clicks" Daily Activity     Outcome Measure   Help from another person eating meals?: A Lot Help from another  person taking care of personal grooming?: Total Help from another person toileting, which includes using toliet, bedpan, or urinal?: Total Help from another person bathing (including washing, rinsing, drying)?: Total Help from another person to put on and taking off regular upper body clothing?: Total Help from another person to put on and taking off regular lower body clothing?: Total 6 Click Score: 7    End of Session    OT Visit Diagnosis: Muscle weakness (generalized) (M62.81);Apraxia (R48.2);Cognitive communication deficit (R41.841);Hemiplegia and hemiparesis Symptoms and signs involving cognitive functions: Other Nontraumatic ICH Hemiplegia - Right/Left: Right Hemiplegia - caused by: Nontraumatic intracerebral hemorrhage   Activity Tolerance Patient limited by fatigue   Patient Left in bed;with call bell/phone within reach;with family/visitor present   Nurse Communication Mobility status        Time: 7680-8811 OT Time Calculation (min): 54 min  Charges: OT General Charges $OT Visit: 1 Visit OT Treatments $Neuromuscular Re-education: 23-37 mins  Eber Jones., OTR/L Acute Rehabilitation Services Pager [phone] Office [phone]  Jeani Hawking M 01/28/2021, 4:12 PM

## 2021-01-28 NOTE — NC FL2 (Signed)
[place] LEVEL OF CARE SCREENING TOOL     IDENTIFICATION  Patient Name: Charlene [name] Birthdate: Jan 09, 1966 Sex: female Admission Date (Current Location): 08/26/2020  [place] and [place] Number:  (P) [place] (P) Medicaid potential - pending #[id number] [place] and Address:  (P) [facility] [place], [place] [address], [place], [place] [address]      Provider Number: [phone]  Attending Physician Name and Address:  Mercy Riding, MD  Relative Name and Phone Number:  Mamie Nick) [name], husband - [phone]    Current Level of Care: (P) Hospital Recommended Level of Care: (P) [place] Prior Approval Number:    Date Approved/Denied:   PASRR Number: Mamie Nick) [id number]  Discharge Plan: (P) SNF    Current Diagnoses: Patient Active Problem List   Diagnosis Date Noted   Knee pain, right    Constipation    Ileus, unspecified ([place])    Acute cystitis without hematuria    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    Aspiration pneumonia of right lower lobe (HCC)    Poor prognosis 10/08/2020   Muscle hypertonicity    Oropharyngeal dysphagia    Palliative care by specialist    Tracheostomy dependent ([place])    [place] (intracerebral hemorrhage) ([place]) 08/27/2020   Acute hypoxemic respiratory failure ([place])    Anxiety state, unspecified 03/11/2014   Essential hypertension 03/11/2014   Essential hypertension, benign 02/28/2014   Accelerated hypertension 02/21/2014   Nausea & vomiting 02/21/2014   Headache 02/21/2014   Chest pain 02/21/2014    Orientation RESPIRATION BLADDER Height & Weight     (P)  (unable to assess due to tracheostomy)  (P) Tracheostomy (P) External catheter Weight: 62.5 kg Height:  '5\' 5"'  (165.1 cm)  BEHAVIORAL SYMPTOMS/MOOD NEUROLOGICAL BOWEL NUTRITION STATUS      (P) Incontinent Feeding tube, Diet  AMBULATORY STATUS COMMUNICATION OF NEEDS Skin   (P) Total  Care (P) Does not communicate (#4 tracheostomy - humidified tracheostomy collar) (P) Other (Comment) (tracheostomy and PEG present)                       Personal Care Assistance Level of Assistance  (P) Bathing, Feeding, Dressing, Total care Bathing Assistance: (P) Maximum assistance Feeding assistance: (P) Maximum assistance Dressing Assistance: (P) Maximum assistance Total Care Assistance: (P) Maximum assistance   Functional Limitations Info  (P) Sight, Hearing, Speech Sight Info: (P) Impaired Hearing Info: (P) Impaired Speech Info: (P) Impaired    SPECIAL CARE FACTORS FREQUENCY  PT (By licensed PT), OT (By licensed OT), Speech therapy     PT Frequency: 2-3 times per week OT Frequency: 2-3 times per week     Speech Therapy Frequency: 1-2 times per week      Contractures Contractures Info: Present    Additional Factors Info  Code Status, Allergies, Isolation Precautions Code Status Info: Full code Allergies Info: NKDA     Isolation Precautions Info: ESBL     Current Medications (01/28/2021):  This is the current hospital active medication list Current Facility-Administered Medications  Medication Dose Route Frequency Provider Last Rate Last Admin   0.9 %  sodium chloride infusion   Intravenous PRN Sherrilyn Rist A, MD   Stopped at 10/26/20 1445   acetaminophen (TYLENOL) tablet 650 mg  650 mg Per Tube Q6H Samella Parr, NP   650 mg at 01/28/21 0607   amLODipine (NORVASC) tablet 10 mg  10 mg Per  Tube Daily Danford, Suann Larry, MD   10 mg at 01/28/21 0917   atorvastatin (LIPITOR) tablet 40 mg  40 mg Per Tube Daily Elodia Florence., MD   40 mg at 01/28/21 0917   baclofen (OZOBAX) 1 mg/mL oral solution 10 mg  10 mg Per Tube 3 times per day Pokhrel, Corrie Mckusick, MD   10 mg at 01/28/21 0915   benzocaine (ORAJEL) 10 % mucosal gel   Mouth/Throat QID PRN Samella Parr, NP   1 application at 14/43/15 4008   celecoxib (CELEBREX) capsule 200 mg  200 mg Per Tube BID  Samella Parr, NP   200 mg at 01/28/21 0917   chlorhexidine gluconate (MEDLINE KIT) (PERIDEX) 0.12 % solution 15 mL  15 mL Mouth Rinse BID Rosalin Hawking, MD   15 mL at 01/28/21 6761   Chlorhexidine Gluconate Cloth 2 % PADS 6 each  6 each Topical Daily Amie Portland, MD   6 each at 01/28/21 0919   diclofenac Sodium (VOLTAREN) 1 % topical gel 4 g  4 g Topical QID Samella Parr, NP   4 g at 01/28/21 0916   doxazosin (CARDURA) tablet 2 mg  2 mg Per Tube Daily Pokhrel, Laxman, MD   2 mg at 01/28/21 0920   feeding supplement (ENSURE ENLIVE / ENSURE PLUS) liquid 237 mL  237 mL Oral TID BM Gonfa, Taye T, MD   237 mL at 01/28/21 0919   feeding supplement (PROSource TF) liquid 45 mL  45 mL Per Tube Daily Garvin Fila, MD   45 mL at 01/28/21 9509   fiber (NUTRISOURCE FIBER) 1 packet  1 packet Per Tube BID Samella Parr, NP   1 packet at 01/28/21 0920   free water 200 mL  200 mL Per Tube Q4H Samella Parr, NP   200 mL at 01/28/21 0914   hydrALAZINE (APRESOLINE) tablet 37.5 mg  37.5 mg Per Tube Q8H Samella Parr, NP   37.5 mg at 01/28/21 0607   insulin aspart (novoLOG) injection 0-9 Units  0-9 Units Subcutaneous Q4H Jennelle Human B, NP   2 Units at 01/27/21 2351   labetalol (NORMODYNE) injection 10 mg  10 mg Intravenous Q2H PRN Pokhrel, Laxman, MD   10 mg at 12/18/20 1807   magic mouthwash w/lidocaine  10 mL Oral QID Samella Parr, NP   10 mL at 01/28/21 0915   MEDLINE mouth rinse  15 mL Mouth Rinse 5 X Daily Nita Sells, MD   15 mL at 01/28/21 0916   metoprolol tartrate (LOPRESSOR) 25 mg/10 mL oral suspension 100 mg  100 mg Per Tube BID Samella Parr, NP   100 mg at 01/28/21 0921   modafinil (PROVIGIL) tablet 200 mg  200 mg Per Tube Daily Meredith Staggers, MD   200 mg at 01/28/21 3267   multivitamin with minerals tablet 1 tablet  1 tablet Oral Daily Wendee Beavers T, MD   1 tablet at 01/28/21 0921   pantoprazole sodium (PROTONIX) 40 mg/20 mL oral suspension 40 mg  40 mg Per Tube  Daily Samella Parr, NP   40 mg at 01/28/21 0915   polyethylene glycol (MIRALAX / GLYCOLAX) packet 17 g  17 g Per Tube Daily PRN Pokhrel, Laxman, MD   17 g at 01/12/21 1334   tiZANidine (ZANAFLEX) tablet 4 mg  4 mg Per Tube QHS Samella Parr, NP   4 mg at 01/27/21 2156   traMADol (ULTRAM) tablet 25  mg  25 mg Per Tube Q6H Pokhrel, Laxman, MD   25 mg at 01/28/21 8937     Discharge Medications: Please see discharge summary for a list of discharge medications.  Relevant Imaging Results:  Relevant Lab Results:   Additional Information SSN: [id number]  Curlene Labrum, RN

## 2021-01-29 LAB — GLUCOSE, CAPILLARY
Glucose-Capillary: 108 mg/dL — ABNORMAL HIGH (ref 70–99)
Glucose-Capillary: 111 mg/dL — ABNORMAL HIGH (ref 70–99)
Glucose-Capillary: 123 mg/dL — ABNORMAL HIGH (ref 70–99)
Glucose-Capillary: 97 mg/dL (ref 70–99)
Glucose-Capillary: 118 mg/dL — ABNORMAL HIGH (ref 70–99)
Glucose-Capillary: 106 mg/dL — ABNORMAL HIGH (ref 70–99)

## 2021-01-29 MED ORDER — TRAMADOL HCL 50 MG PO TABS
25.0000 mg | ORAL_TABLET | Freq: Two times a day (BID) | ORAL | Status: DC | PRN
Start: 2021-01-29 — End: 2021-02-13
  Administered 2021-01-30 – 2021-02-11 (×4): 25 mg via ORAL
  Filled 2021-01-29 (×4): qty 1

## 2021-01-29 MED ORDER — CARVEDILOL 12.5 MG PO TABS
25.0000 mg | ORAL_TABLET | Freq: Two times a day (BID) | ORAL | Status: DC
  Administered 2021-01-29 – 2021-04-16 (×155): 25 mg
  Filled 2021-01-29 (×8): qty 2
  Filled 2021-01-29: qty 4
  Filled 2021-01-29 (×3): qty 2
  Filled 2021-01-29: qty 4
  Filled 2021-01-29 (×10): qty 2
  Filled 2021-01-29: qty 8
  Filled 2021-01-29 (×42): qty 2
  Filled 2021-01-29: qty 4
  Filled 2021-01-29 (×71): qty 2
  Filled 2021-01-29: qty 4
  Filled 2021-01-29 (×9): qty 2
  Filled 2021-01-29: qty 4
  Filled 2021-01-29 (×10): qty 2

## 2021-01-29 NOTE — TOC Progression Note (Addendum)
Transition of Care [facility]) - Progression Note    Patient Details  Name: Charlene [name] MRN: [id number] Date of Birth: 07-21-1965  Transition of Care [facility]) CM/SW Contact  Janae Bridgeman, RN Phone Number: 01/29/2021, 11:42 AM  Clinical Narrative:    Case management called and left a message for Reeshema, CM at [facility] to ask if the facility as made a decision on the possible bed offer for the patient.  Waiting for return call.  01/29/2021 1217 - I spoke with Sullivan Lone, CM at [facility] and they were unable to extend a bed offer to the patient due to the patient's complexity of care at this time.  CM and MSW will continue to follow the patient for needed SNF placement and pending court date scheduled for 02/12/2021.   Expected Discharge Plan: Skilled Nursing Facility Barriers to Discharge: Continued Medical Work up  Expected Discharge Plan and Services Expected Discharge Plan: Skilled Nursing Facility In-house Referral: Clinical Social Work Discharge Planning Services: CM Consult Post Acute Care Choice: Skilled Nursing Facility Living arrangements for the past 2 months: Single Family Home                                       Social Determinants of Health (SDOH) Interventions    Readmission Risk Interventions Readmission Risk Prevention Plan 09/29/2020  Transportation Screening Complete  PCP or Specialist Appt within 3-5 Days Complete  HRI or Home Care Consult Complete  Social Work Consult for Recovery Care Planning/Counseling Complete  Palliative Care Screening Complete  Medication Review Oceanographer) Complete  Some recent data might be hidden

## 2021-01-29 NOTE — Progress Notes (Signed)
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  Subsequently on 2/10, she developed obtundation and respiratory arrest leading to intubation. Hypertonic saline d/c'd. EVD not working, and removed on 2/13. She has remained persistently obtunded, now has a trach and a PEG.    Eventually started on provigil with improvements in alertness.  Evaluated by rehab physician who recommended increasing provigil dose further.  Subsequently patient has become more alert.  Most days she is able to track and with encouragement able to follow simple commands especially since partial obstruction glasses placed on left eye.  See progress note dictated 4/27 for expanded details.  Hospital course complicated by persistent fever, serratia pneumonia and now pre-orbital cellulitis for which she completed treatment. Medically stable for discharge once SNF with trach bed available.   Subjective: Seen and examined earlier this morning.  No major events overnight of this morning.  Patient is awake and alert but does not follow command or verbalize.  Does not appear to be in distress.  [name] at bedside attending to the patient.  Objective: Vitals:   01/29/21 0748 01/29/21 0800 01/29/21 1204 01/29/21 1207  BP:    (!) 143/85  Pulse: 91 87  88  Resp: (!) _0 Temp:    98.5 F (36.9 C)  TempSrc:    Oral  SpO2: 100% 100% 99% 99%  Weight:      Height:        Intake/Output Summary (Last 24 hours) at 01/29/2021 1431 Last data filed at 01/29/2021 1257 Gross per 24 hour  Intake 1050 ml  Output 550 ml  Net 500 ml   Filed Weights   01/23/21 0500  01/26/21 0500 01/28/21 0500  Weight: 61.5 kg 63.8 kg 62.5 kg    Examination:  GENERAL: No apparent distress.  Nontoxic. HEENT: MMM.  Seems to have left neglect and diminished hearing in left ear. NECK: Tracheostomy in place. RESP:  No IWOB.  Fair aeration bilaterally. CVS:  RRR. Heart sounds normal.  ABD/GI/GU: BS+. Abd soft, NTND.  MSK/EXT: Contractures in extremities.  Significant muscle mass and subcu fat loss. SKIN: no apparent skin lesion or wound NEURO: Awake and alert.  Does not follow command.  Not able to assess orientation.  Significant contractures in all extremities. PSYCH: Calm. Normal affect.   Significant Hospital events:  2/09 Admit for ICH 2/10 EVD placed, later intubated due to obtundation and respiratory arrest 2/11 MRI Brain, hypertonic saline d/c'd, EVD not working.  Husband did not consent to PICC 2/13 EVD removed 2/17 [place] discussion with family, PMT 2/24 tracheostomy placed by Dr. Lynetta Mare, PEG placed by Dr. Kieth Brightly 3/11 trach changed to [place] 6 uncuffed 3/21 sputum culture with Serratia resistant to Ancef 4/18 28% ATC / 5L 5/08 downsized trach to 4  Microbiology summarized: 2/9 COVID + Influenza negative 2/10 MRSA PCR negative 2/12 BCx 2 > no growth 2/19 BC 1/2 +Stap EPI, 1/2 no growth  10/07/2020 sputum positive for Serratia marcescens 4/8 Enterobacter cloaca + urine culture MDR sensitive only to imipenem and gentamicin  Assessment & Plan: Nontraumatic TBI/right thalamic ICH with IVH Obstructive hydrocephalus Multiple acute strokes involving  corona radiata, right BG and midbrain likely from [place] Acute metabolic encephalopathy -Continue baclofen 3 times daily and tizanidine at bedtime for spasticity.  -Continue Provigil per PM&R -Continues to work well and consistently with therapies.   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 ESBL positive Enterobacter UTI on 4/8-completed 5 days of meropenem Klebsiella UTI on 5/20 and 7/4-completed antibiotic course with Septra on 7/7 per ID rec -Contact isolation during hospitalization   Rhythmic activity of right side: EEG negative for seizure activity   Colonic Ileus/recurrent constipation//diarrhea: Resolved   Acute dental pain: CT maxillofacial without evidence of overt infection or abscess -Continue Orajel QID and Magic mouthwash with lidocaine for symptom   Recurrent right knee pain/known ligamentous injury and chronic osteoarthritis -s/p injection of Marcaine and Depomedrol 6/30 -Continue scheduled Celebrex and Tylenol. -Wean off tramadol-could increase her risk of seizure   Hypertensive emergency/Malignant HTN: BP elevated today and yesterday. -Continue Norvasc and hydralazine  -Change metoprolol to Coreg for better BP control   Dysphagia-advance to dysphagia 1 diet by SLP -Begin bolus tube feeds between meals with Ensure and Prosource   History of urinary retention -Continue Cardura   Right upper extremity thrombophlebitis: [name] -> age indeterminate SVT of right cephalic vein -On SCD for VTE prophylaxis given significant ICH   Bacterial Conjunctivitis /periorbital cellulitis: Resolved   Chronic pansinusitis/Aspiration pneumonia/Hospital Acquired Pneumonia:  -Completed multiple rounds of antibiotics   Anemia: H&H stable stable Recent Labs    11/15/20 0150 11/26/20 0906 12/04/20 1015 12/07/20 0451 12/14/20 0522 12/18/20 1014 12/19/20 0325 01/16/21 0702 01/21/21 0142 01/27/21 0905  HGB 10.1* 10.8* 10.6* 10.5* 10.5* 11.0* 10.9* 10.6* 10.7* 10.5*  -Monitor intermittently  Inadequate oral intake Body mass index is 22.93 kg/m. Nutrition Problem: Inadequate oral intake Etiology: inability to eat Signs/Symptoms: NPO  status Interventions: Tube feeding   DVT prophylaxis:  SCD's Start: 08/27/20 0026  Code Status: Full code Family Communication: Patient and/or RN.  Updated patient's mother at bedside Level of care: Telemetry Medical Status is: Inpatient  Remains inpatient appropriate because:Unsafe d/c plan  Dispo: The patient is from: Home              Anticipated d/c is to: SNF              Patient currently is medically stable to d/c.   Difficult to place patient No       Consultants:  Neurology Pulmonology PM&R Palliative medicine   Sch Meds:  Scheduled Meds:  acetaminophen  650 mg Per Tube Q6H   amLODipine  10 mg Per Tube Daily   atorvastatin  40 mg Per Tube Daily   baclofen  10 mg Per Tube 3 times per day   carvedilol  25 mg Per Tube BID WC   celecoxib  200 mg Per Tube BID   chlorhexidine gluconate (MEDLINE KIT)  15 mL Mouth Rinse BID   Chlorhexidine Gluconate Cloth  6 each Topical Daily   diclofenac Sodium  4 g Topical QID   doxazosin  2 mg Per Tube Daily   feeding supplement  237 mL Oral TID BM   feeding supplement (PROSource TF)  45 mL Per Tube Daily   fiber  1 packet Per Tube BID   free water  200 mL Per Tube Q4H   hydrALAZINE  37.5 mg Per Tube Q8H   insulin aspart  0-9 Units Subcutaneous Q4H   magic mouthwash w/lidocaine  10 mL Oral QID   mouth rinse  15 mL Mouth Rinse 5 X Daily   modafinil  200 mg Per Tube Daily   multivitamin with minerals  1 tablet Oral Daily   pantoprazole sodium  40 mg Per Tube Daily   tiZANidine  4 mg Per Tube QHS   traMADol  25 mg Per Tube Q6H   Continuous Infusions:  sodium chloride Stopped (10/26/20 1445)   PRN Meds:.sodium chloride, benzocaine, labetalol, polyethylene glycol  Antimicrobials: Anti-infectives (From admission, onward)    Start     Dose/Rate Route Frequency Ordered Stop   01/20/21 1015  sulfamethoxazole-trimethoprim (BACTRIM) 200-40 MG/5ML suspension 20 mL        20 mL Per Tube Every 12 hours 01/20/21 0918 01/20/21  2210   01/20/21 0900  meropenem (MERREM) 1 g in sodium chloride 0.9 % 100 mL IVPB  Status:  Discontinued        1 g 200 mL/hr over 30 Minutes Intravenous Every 8 hours 01/20/21 0815 01/20/21 0917   01/18/21 1145  sulfamethoxazole-trimethoprim (BACTRIM) 200-40 MG/5ML suspension 20 mL  Status:  Discontinued        20 mL Per Tube Every 12 hours 01/18/21 1052 01/20/21 0742   01/18/21 1115  ciprofloxacin (CIPRO) 500 MG/5ML (10%) suspension 500 mg  Status:  Discontinued        500 mg Oral 2 times daily 01/18/21 1016 01/18/21 1051   12/19/20 1430  cefTRIAXone (ROCEPHIN) 1 g in sodium chloride 0.9 % 100 mL IVPB        1 g 200 mL/hr over 30 Minutes Intravenous Every 24 hours 12/19/20 1233 12/25/20 1711   12/18/20 1400  meropenem (MERREM) 1 g in sodium chloride 0.9 % 100 mL IVPB  Status:  Discontinued        1 g 200 mL/hr over 30 Minutes Intravenous Every 8 hours 12/18/20 1255 12/19/20 1233   12/06/20 1500  cefTRIAXone (ROCEPHIN) 1 g in sodium chloride 0.9 % 100 mL IVPB        1 g 200 mL/hr over 30 Minutes Intravenous Every 24 hours 12/06/20 1404 12/10/20 1500   12/04/20 1400  meropenem (MERREM) 1 g in sodium chloride 0.9 % 100 mL IVPB  Status:  Discontinued        1 g 200 mL/hr over 30 Minutes Intravenous Every 8 hours 12/04/20 1207 12/06/20 1404   10/27/20 0845  meropenem (MERREM) 1 g in sodium chloride 0.9 % 100 mL IVPB  Status:  Discontinued        1 g 200 mL/hr over 30 Minutes Intravenous Every 8 hours 10/27/20 0752 11/01/20 0720   10/26/20 1145  cefTRIAXone (ROCEPHIN) 1 g in sodium chloride 0.9 % 100 mL IVPB  Status:  Discontinued        1 g 200 mL/hr over 30 Minutes Intravenous Every 24 hours 10/26/20 1056 10/27/20 0752   10/14/20 1815  Ampicillin-Sulbactam (UNASYN) 3 g in sodium chloride 0.9 % 100 mL IVPB        3 g 200 mL/hr over 30 Minutes Intravenous Every 6 hours 10/14/20 1754 10/19/20 1340   10/08/20 0845  ciprofloxacin (CIPRO) tablet 500 mg  Status:  Discontinued        500 mg Per  Tube 2 times daily 10/08/20 0754 10/14/20 1731   09/21/20 1545  ceFAZolin (ANCEF) IVPB 1 g/50 mL premix  1 g 100 mL/hr over 30 Minutes Intravenous Every 8 hours 09/21/20 1457 09/28/20 0647   09/20/20 0600  ceFAZolin (ANCEF) IVPB 1 g/50 mL premix  Status:  Discontinued        1 g 100 mL/hr over 30 Minutes Intravenous Every 8 hours 09/14/20 0903 09/14/20 0904   09/16/20 1300  vancomycin (VANCOREADY) IVPB 1250 mg/250 mL  Status:  Discontinued        1,250 mg 166.7 mL/hr over 90 Minutes Intravenous Every 24 hours 09/15/20 1232 09/17/20 0956   09/15/20 1230  vancomycin (VANCOREADY) IVPB 1250 mg/250 mL        1,250 mg 166.7 mL/hr over 90 Minutes Intravenous  Once 09/15/20 1131 09/15/20 1402   09/13/20 1400  ceFEPIme (MAXIPIME) 2 g in sodium chloride 0.9 % 100 mL IVPB        2 g 200 mL/hr over 30 Minutes Intravenous Every 8 hours 09/13/20 1341 09/19/20 2208   09/06/20 0600  vancomycin (VANCOREADY) IVPB 1250 mg/250 mL        1,250 mg 166.7 mL/hr over 90 Minutes Intravenous  Once 09/06/20 0541 09/06/20 0739   09/05/20 1400  ceFAZolin (ANCEF) IVPB 1 g/50 mL premix  Status:  Discontinued        1 g 100 mL/hr over 30 Minutes Intravenous Every 8 hours 09/05/20 1120 09/13/20 1341   08/30/20 0600  vancomycin (VANCOREADY) IVPB 500 mg/100 mL  Status:  Discontinued        500 mg 100 mL/hr over 60 Minutes Intravenous Every 12 hours 08/29/20 1554 09/01/20 1115   08/29/20 1700  ceFEPIme (MAXIPIME) 2 g in sodium chloride 0.9 % 100 mL IVPB  Status:  Discontinued        2 g 200 mL/hr over 30 Minutes Intravenous Every 12 hours 08/29/20 1554 09/01/20 1115   08/29/20 1645  vancomycin (VANCOREADY) IVPB 1250 mg/250 mL        1,250 mg 166.7 mL/hr over 90 Minutes Intravenous  Once 08/29/20 1554 08/29/20 1820        I have personally reviewed the following labs and images: CBC: Recent Labs  Lab 01/27/21 0905  WBC 5.5  NEUTROABS 3.2  HGB 10.5*  HCT 32.6*  MCV 77.6*  PLT 316   BMP &GFR Recent  Labs  Lab 01/27/21 0905  NA 138  K 3.3*  CL 101  CO2 29  GLUCOSE 149*  BUN 14  CREATININE 0.60  CALCIUM 9.4   Estimated Creatinine Clearance: 73.2 mL/min (by C-G formula based on SCr of 0.6 mg/dL). Liver & Pancreas: Recent Labs  Lab 01/27/21 0905  AST 27  ALT 34  ALKPHOS 79  BILITOT 0.3  PROT 6.7  ALBUMIN 3.2*   No results for input(s): LIPASE, AMYLASE in the last 168 hours. No results for input(s): AMMONIA in the last 168 hours. Diabetic: No results for input(s): HGBA1C in the last 72 hours. Recent Labs  Lab 01/28/21 2008 01/28/21 2339 01/29/21 0454 01/29/21 0742 01/29/21 1205  GLUCAP 116* 121* 108* 111* 123*   Cardiac Enzymes: No results for input(s): CKTOTAL, CKMB, CKMBINDEX, TROPONINI in the last 168 hours. No results for input(s): PROBNP in the last 8760 hours. Coagulation Profile: No results for input(s): INR, PROTIME in the last 168 hours. Thyroid Function Tests: No results for input(s): TSH, T4TOTAL, FREET4, T3FREE, THYROIDAB in the last 72 hours. Lipid Profile: No results for input(s): CHOL, HDL, LDLCALC, TRIG, CHOLHDL, LDLDIRECT in the last 72 hours. Anemia Panel: No results for input(s): VITAMINB12, FOLATE, FERRITIN,  TIBC, IRON, RETICCTPCT in the last 72 hours. Urine analysis:    Component Value Date/Time   COLORURINE STRAW (A) 01/16/2021 1741   APPEARANCEUR CLEAR 01/16/2021 1741   LABSPEC 1.004 (L) 01/16/2021 1741   PHURINE 8.0 01/16/2021 1741   GLUCOSEU NEGATIVE 01/16/2021 1741   HGBUR NEGATIVE 01/16/2021 1741   BILIRUBINUR NEGATIVE 01/16/2021 1741   KETONESUR NEGATIVE 01/16/2021 1741   PROTEINUR NEGATIVE 01/16/2021 1741   UROBILINOGEN 0.2 02/21/2014 1629   NITRITE NEGATIVE 01/16/2021 1741   LEUKOCYTESUR NEGATIVE 01/16/2021 1741   Sepsis Labs: Invalid input(s): PROCALCITONIN, [place]  Microbiology: No results found for this or any previous visit (from the past 240 hour(s)).  Radiology Studies: No results found.    Keiandra Sullenger T.  [place]  If 7PM-7AM, please contact night-coverage [email or web address] 01/29/2021, 2:31 PM

## 2021-01-30 NOTE — Progress Notes (Signed)
PROGRESS NOTE    Charlene [name]  XBJ:[id number] DOB: [birth year] DOA: 08/26/2020 PCP: Patient, [name] (Inactive)    Brief Narrative:  55 yo F with 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  Subsequently on 2/10, she developed obtundation and respiratory arrest leading to intubation. Hypertonic saline d/c'd. EVD not working, and removed on 2/13. She has remained persistently obtunded, now has a trach and a PEG.   Eventually started on provigil with improvements in alertness.  Evaluated by rehab physician who recommended increasing provigil dose further.  Subsequently patient has become more alert.  Most days she is able to track and with encouragement able to follow simple commands especially since partial obstruction glasses placed on left eye.  See progress note dictated 4/27 for expanded details.   Hospital course complicated by persistent fever, serratia pneumonia and now pre-orbital cellulitis for which she completed treatment. Medically stable for discharge once SNF with trach bed available.   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  -Continue Provigil per  PM&R -Continues to work well and consistently with therapies.   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 ESBL positive Enterobacter UTI on 4/8-completed 5 days of meropenem Klebsiella UTI on 5/20 and 7/4-completed antibiotic course with Septra on 7/7 per ID rec -Contact isolation during hospitalization   Rhythmic activity of right side: EEG negative for seizure activity   Colonic Ileus/recurrent constipation//diarrhea: Resolved   Acute dental pain: CT maxillofacial without evidence of overt infection or abscess -Continue Orajel QID and Magic mouthwash with lidocaine for symptom   Recurrent right knee pain/known ligamentous injury and chronic osteoarthritis -s/p injection of Marcaine and Depomedrol 6/30 -Continue scheduled Celebrex and Tylenol. -Wean off tramadol-could increase her risk of seizure   Hypertensive emergency/Malignant HTN: BP elevated today and yesterday. -Continue Norvasc and hydralazine  -Change metoprolol to Coreg for better BP control   Dysphagia-advance to dysphagia 1 diet by SLP -Begin bolus tube feeds between meals with Ensure and Prosource   History of urinary retention -Continue Cardura   Right upper extremity thrombophlebitis: [name] -> age indeterminate SVT of right cephalic vein -On SCD for VTE prophylaxis given significant ICH   Bacterial Conjunctivitis /periorbital cellulitis: Resolved   Chronic pansinusitis/Aspiration pneumonia/Hospital Acquired Pneumonia: -Completed multiple rounds of antibiotics   Anemia: H&H stable stable -Continue to monitor intermittently   Inadequate oral intake Body mass index is 22.93 kg/m. Nutrition Problem: Inadequate oral intake Etiology: inability to eat Signs/Symptoms:  NPO status Interventions: Tube feeding  DVT prophylaxis: SCD's Code Status: Full Family Communication: Pt in room, family not at bedside  Status is: Inpatient  Remains inpatient appropriate because:Unsafe d/c plan  Dispo: The patient is from: Home              Anticipated d/c is to: SNF              Patient currently is not medically stable to d/c.   Difficult to place patient No  Consultants:  Neurology Pulmonology PM&R Palliative medicine  Procedures:    Antimicrobials: Anti-infectives (From admission, onward)    Start     Dose/Rate Route Frequency Ordered Stop   01/20/21 1015  sulfamethoxazole-trimethoprim (BACTRIM) 200-40 MG/5ML suspension 20 mL        20 mL Per Tube Every 12 hours 01/20/21 0918 01/20/21 2210   01/20/21 0900  meropenem (MERREM) 1 g in sodium chloride 0.9 % 100 mL IVPB  Status:  Discontinued        1 g 200 mL/hr over 30 Minutes Intravenous Every 8 hours 01/20/21 0815 01/20/21 0917   01/18/21 1145  sulfamethoxazole-trimethoprim (BACTRIM) 200-40 MG/5ML suspension 20 mL  Status:  Discontinued        20 mL Per Tube Every 12 hours 01/18/21 1052 01/20/21 0742   01/18/21 1115  ciprofloxacin (CIPRO) 500 MG/5ML (10%) suspension 500 mg  Status:  Discontinued        500 mg Oral 2 times daily 01/18/21 1016 01/18/21 1051   12/19/20 1430  cefTRIAXone (ROCEPHIN) 1 g in sodium chloride 0.9 % 100 mL IVPB        1 g 200 mL/hr over 30 Minutes Intravenous Every 24 hours 12/19/20 1233 12/25/20 1711   12/18/20 1400  meropenem (MERREM) 1 g in sodium chloride 0.9 % 100 mL IVPB  Status:  Discontinued        1 g 200 mL/hr over 30 Minutes Intravenous Every 8 hours 12/18/20 1255 12/19/20 1233   12/06/20 1500  cefTRIAXone (ROCEPHIN) 1 g in sodium chloride 0.9 % 100 mL IVPB        1 g 200 mL/hr over 30 Minutes Intravenous Every 24 hours 12/06/20 1404 12/10/20 1500   12/04/20 1400  meropenem (MERREM) 1 g in sodium chloride 0.9 % 100 mL IVPB  Status:  Discontinued        1 g 200  mL/hr over 30 Minutes Intravenous Every 8 hours 12/04/20 1207 12/06/20 1404   10/27/20 0845  meropenem (MERREM) 1 g in sodium chloride 0.9 % 100 mL IVPB  Status:  Discontinued        1 g 200 mL/hr over 30 Minutes Intravenous Every 8 hours 10/27/20 0752 11/01/20 0720   10/26/20 1145  cefTRIAXone (ROCEPHIN) 1 g in sodium chloride 0.9 % 100 mL IVPB  Status:  Discontinued        1 g 200 mL/hr over 30 Minutes Intravenous Every 24 hours 10/26/20 1056 10/27/20 0752   10/14/20 1815  Ampicillin-Sulbactam (UNASYN) 3 g in sodium chloride 0.9 % 100 mL IVPB        3 g 200 mL/hr over 30 Minutes Intravenous Every 6 hours 10/14/20 1754 10/19/20 1340   10/08/20 0845  ciprofloxacin (CIPRO) tablet 500 mg  Status:  Discontinued        500 mg Per Tube 2 times daily 10/08/20 0754 10/14/20 1731   09/21/20 1545  ceFAZolin (ANCEF) IVPB 1 g/50 mL premix        1 g 100 mL/hr over 30 Minutes Intravenous Every 8 hours 09/21/20 1457 09/28/20 0647   09/20/20 0600  ceFAZolin (ANCEF) IVPB 1  g/50 mL premix  Status:  Discontinued        1 g 100 mL/hr over 30 Minutes Intravenous Every 8 hours 09/14/20 0903 09/14/20 0904   09/16/20 1300  vancomycin (VANCOREADY) IVPB 1250 mg/250 mL  Status:  Discontinued        1,250 mg 166.7 mL/hr over 90 Minutes Intravenous Every 24 hours 09/15/20 1232 09/17/20 0956   09/15/20 1230  vancomycin (VANCOREADY) IVPB 1250 mg/250 mL        1,250 mg 166.7 mL/hr over 90 Minutes Intravenous  Once 09/15/20 1131 09/15/20 1402   09/13/20 1400  ceFEPIme (MAXIPIME) 2 g in sodium chloride 0.9 % 100 mL IVPB        2 g 200 mL/hr over 30 Minutes Intravenous Every 8 hours 09/13/20 1341 09/19/20 2208   09/06/20 0600  vancomycin (VANCOREADY) IVPB 1250 mg/250 mL        1,250 mg 166.7 mL/hr over 90 Minutes Intravenous  Once 09/06/20 0541 09/06/20 0739   09/05/20 1400  ceFAZolin (ANCEF) IVPB 1 g/50 mL premix  Status:  Discontinued        1 g 100 mL/hr over 30 Minutes Intravenous Every 8 hours 09/05/20 1120  09/13/20 1341   08/30/20 0600  vancomycin (VANCOREADY) IVPB 500 mg/100 mL  Status:  Discontinued        500 mg 100 mL/hr over 60 Minutes Intravenous Every 12 hours 08/29/20 1554 09/01/20 1115   08/29/20 1700  ceFEPIme (MAXIPIME) 2 g in sodium chloride 0.9 % 100 mL IVPB  Status:  Discontinued        2 g 200 mL/hr over 30 Minutes Intravenous Every 12 hours 08/29/20 1554 09/01/20 1115   08/29/20 1645  vancomycin (VANCOREADY) IVPB 1250 mg/250 mL        1,250 mg 166.7 mL/hr over 90 Minutes Intravenous  Once 08/29/20 1554 08/29/20 1820       Subjective: Without complaints  Objective: Vitals:   01/30/21 0744 01/30/21 0755 01/30/21 0917 01/30/21 1200  BP: (!) 157/92  (!) 157/92   Pulse: 92 86 100   Resp: 16 15    Temp: 98.7 F (37.1 C)     TempSrc: Oral     SpO2: 99% 100%  100%  Weight:      Height:        Intake/Output Summary (Last 24 hours) at 01/30/2021 1550 Last data filed at 01/30/2021 0453 Gross per 24 hour  Intake --  Output 550 ml  Net -550 ml   Filed Weights   01/23/21 0500 01/26/21 0500 01/28/21 0500  Weight: 61.5 kg 63.8 kg 62.5 kg    Examination: General exam: Awake, laying in bed, in nad Respiratory system: Normal respiratory effort, no wheezing  Data Reviewed: I have personally reviewed following labs and imaging studies  CBC: Recent Labs  Lab 01/27/21 0905  WBC 5.5  NEUTROABS 3.2  HGB 10.5*  HCT 32.6*  MCV 77.6*  PLT 794   Basic Metabolic Panel: Recent Labs  Lab 01/27/21 0905  NA 138  K 3.3*  CL 101  CO2 29  GLUCOSE 149*  BUN 14  CREATININE 0.60  CALCIUM 9.4   GFR: Estimated Creatinine Clearance: 73.2 mL/min (by C-G formula based on SCr of 0.6 mg/dL). Liver Function Tests: Recent Labs  Lab 01/27/21 0905  AST 27  ALT 34  ALKPHOS 79  BILITOT 0.3  PROT 6.7  ALBUMIN 3.2*   No results for input(s): LIPASE, AMYLASE in the last 168 hours. No results for input(s):  AMMONIA in the last 168 hours. Coagulation Profile: No results for  input(s): INR, PROTIME in the last 168 hours. Cardiac Enzymes: No results for input(s): CKTOTAL, CKMB, CKMBINDEX, TROPONINI in the last 168 hours. BNP (last 3 results) No results for input(s): PROBNP in the last 8760 hours. HbA1C: No results for input(s): HGBA1C in the last 72 hours. CBG: Recent Labs  Lab 01/29/21 0742 01/29/21 1205 01/29/21 1604 01/29/21 2014 01/29/21 2325  GLUCAP 111* 123* 97 118* 106*   Lipid Profile: No results for input(s): CHOL, HDL, LDLCALC, TRIG, CHOLHDL, LDLDIRECT in the last 72 hours. Thyroid Function Tests: No results for input(s): TSH, T4TOTAL, FREET4, T3FREE, THYROIDAB in the last 72 hours. Anemia Panel: No results for input(s): VITAMINB12, FOLATE, FERRITIN, TIBC, IRON, RETICCTPCT in the last 72 hours. Sepsis Labs: No results for input(s): PROCALCITON, LATICACIDVEN in the last 168 hours.  No results found for this or any previous visit (from the past 240 hour(s)).   Radiology Studies: No results found.  Scheduled Meds:  acetaminophen  650 mg Per Tube Q6H   amLODipine  10 mg Per Tube Daily   atorvastatin  40 mg Per Tube Daily   baclofen  10 mg Per Tube 3 times per day   carvedilol  25 mg Per Tube BID WC   celecoxib  200 mg Per Tube BID   chlorhexidine gluconate (MEDLINE KIT)  15 mL Mouth Rinse BID   Chlorhexidine Gluconate Cloth  6 each Topical Daily   diclofenac Sodium  4 g Topical QID   doxazosin  2 mg Per Tube Daily   feeding supplement  237 mL Oral TID BM   feeding supplement (PROSource TF)  45 mL Per Tube Daily   fiber  1 packet Per Tube BID   free water  200 mL Per Tube Q4H   hydrALAZINE  37.5 mg Per Tube Q8H   insulin aspart  0-9 Units Subcutaneous Q4H   magic mouthwash w/lidocaine  10 mL Oral QID   mouth rinse  15 mL Mouth Rinse 5 X Daily   modafinil  200 mg Per Tube Daily   multivitamin with minerals  1 tablet Oral Daily   pantoprazole sodium  40 mg Per Tube Daily   tiZANidine  4 mg Per Tube QHS   Continuous Infusions:   sodium chloride Stopped (10/26/20 1445)     LOS: 156 days   Marylu Lund, MD [facility] Pager On Amion  If 7PM-7AM, please contact night-coverage 01/30/2021, 3:50 PM

## 2021-01-31 LAB — GLUCOSE, CAPILLARY
Glucose-Capillary: 104 mg/dL — ABNORMAL HIGH (ref 70–99)
Glucose-Capillary: 112 mg/dL — ABNORMAL HIGH (ref 70–99)

## 2021-01-31 MED ORDER — POTASSIUM CHLORIDE CRYS ER 20 MEQ PO TBCR
60.0000 meq | EXTENDED_RELEASE_TABLET | Freq: Once | ORAL | Status: AC
  Administered 2021-01-31: 60 meq via ORAL
  Filled 2021-01-31: qty 3

## 2021-01-31 NOTE — Progress Notes (Signed)
[name] was changed today per RT protocol,  2 RTs were present during the procedure Ladona Ridgel and Katie RRT, good color change on ETCO2 detector, no bleeding, SATS remained at 100% throughout trach change, patient tolerated very well will continue to monitor patient, RN notified and aware.

## 2021-01-31 NOTE — Procedures (Signed)
Tracheostomy Change Note  Patient Details:   Name: Charlene [name] DOB: September 01, 1965 MRN: [id number]    Airway Documentation:     Evaluation  O2 sats: stable throughout Complications: No apparent complications Patient did tolerate procedure well. Bilateral Breath Sounds: Clear, Diminished    Georgeanna Lea 01/31/2021, 11:46 AM

## 2021-01-31 NOTE — Progress Notes (Signed)
PROGRESS NOTE    ADAMARIE IZZO  LNL:[id number] DOB: 11-25-1965 DOA: 08/26/2020 PCP: Patient, [name] (Inactive)    Brief Narrative:  55 yo F with 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  Subsequently on 2/10, she developed obtundation and respiratory arrest leading to intubation. Hypertonic saline d/c'd. EVD not working, and removed on 2/13. She has remained persistently obtunded, now has a trach and a PEG.   Eventually started on provigil with improvements in alertness.  Evaluated by rehab physician who recommended increasing provigil dose further.  Subsequently patient has become more alert.  Most days she is able to track and with encouragement able to follow simple commands especially since partial obstruction glasses placed on left eye.  See progress note dictated 4/27 for expanded details.   Hospital course complicated by persistent fever, serratia pneumonia and now pre-orbital cellulitis for which she completed treatment. Medically stable for discharge once SNF with trach bed available.   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 Acute metabolic encephalopathy -Continue baclofen 3 times daily and tizanidine at bedtime for spasticity.  -Continue Provigil per  PM&R -Continues to work well and consistently with therapies.   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 ESBL positive Enterobacter UTI on 4/8-completed 5 days of meropenem Klebsiella UTI on 5/20 and 7/4-completed antibiotic course with Septra on 7/7 per ID rec -Contact isolation during hospitalization   Rhythmic activity of right side: EEG negative for seizure activity   Colonic Ileus/recurrent constipation//diarrhea: Resolved   Acute dental pain: CT maxillofacial without evidence of overt infection or abscess -Continue Orajel QID and Magic mouthwash with lidocaine for symptom   Recurrent right knee pain/known ligamentous injury and chronic osteoarthritis -s/p injection of Marcaine and Depomedrol 6/30 -Continue scheduled Celebrex and Tylenol. -Wean off tramadol-could increase her risk of seizure   Hypertensive emergency/Malignant HTN: BP elevated today and yesterday. -Continue Norvasc and hydralazine  -Change metoprolol to Coreg for better BP control   Dysphagia-advance to dysphagia 1 diet by SLP -Begin bolus tube feeds between meals with Ensure and Prosource   History of urinary retention -Continue Cardura   Right upper extremity thrombophlebitis: [name] -> age indeterminate SVT of right cephalic vein -On SCD for VTE prophylaxis given significant ICH   Bacterial Conjunctivitis /periorbital cellulitis: Resolved   Chronic pansinusitis/Aspiration pneumonia/Hospital Acquired Pneumonia: -Completed multiple rounds of antibiotics   Anemia: H&H stable stable -Continue to monitor intermittently   Inadequate oral intake Body mass index is 22.93 kg/m. Nutrition Problem: Inadequate oral intake Etiology: inability to  eat Signs/Symptoms: NPO status Interventions: Tube feeding  DVT prophylaxis: SCD's Code Status: Full Family Communication: Pt in room, family is  currently at bedside  Status is: Inpatient  Remains inpatient appropriate because:Unsafe d/c plan  Dispo: The patient is from: Home              Anticipated d/c is to: SNF              Patient currently is not medically stable to d/c.   Difficult to place patient No  Consultants:  Neurology Pulmonology PM&R Palliative medicine  Procedures:    Antimicrobials: Anti-infectives (From admission, onward)    Start     Dose/Rate Route Frequency Ordered Stop   01/20/21 1015  sulfamethoxazole-trimethoprim (BACTRIM) 200-40 MG/5ML suspension 20 mL        20 mL Per Tube Every 12 hours 01/20/21 0918 01/20/21 2210   01/20/21 0900  meropenem (MERREM) 1 g in sodium chloride 0.9 % 100 mL IVPB  Status:  Discontinued        1 g 200 mL/hr over 30 Minutes Intravenous Every 8 hours 01/20/21 0815 01/20/21 0917   01/18/21 1145  sulfamethoxazole-trimethoprim (BACTRIM) 200-40 MG/5ML suspension 20 mL  Status:  Discontinued        20 mL Per Tube Every 12 hours 01/18/21 1052 01/20/21 0742   01/18/21 1115  ciprofloxacin (CIPRO) 500 MG/5ML (10%) suspension 500 mg  Status:  Discontinued        500 mg Oral 2 times daily 01/18/21 1016 01/18/21 1051   12/19/20 1430  cefTRIAXone (ROCEPHIN) 1 g in sodium chloride 0.9 % 100 mL IVPB        1 g 200 mL/hr over 30 Minutes Intravenous Every 24 hours 12/19/20 1233 12/25/20 1711   12/18/20 1400  meropenem (MERREM) 1 g in sodium chloride 0.9 % 100 mL IVPB  Status:  Discontinued        1 g 200 mL/hr over 30 Minutes Intravenous Every 8 hours 12/18/20 1255 12/19/20 1233   12/06/20 1500  cefTRIAXone (ROCEPHIN) 1 g in sodium chloride 0.9 % 100 mL IVPB        1 g 200 mL/hr over 30 Minutes Intravenous Every 24 hours 12/06/20 1404 12/10/20 1500   12/04/20 1400  meropenem (MERREM) 1 g in sodium chloride 0.9 % 100 mL IVPB  Status:   Discontinued        1 g 200 mL/hr over 30 Minutes Intravenous Every 8 hours 12/04/20 1207 12/06/20 1404   10/27/20 0845  meropenem (MERREM) 1 g in sodium chloride 0.9 % 100 mL IVPB  Status:  Discontinued        1 g 200 mL/hr over 30 Minutes Intravenous Every 8 hours 10/27/20 0752 11/01/20 0720   10/26/20 1145  cefTRIAXone (ROCEPHIN) 1 g in sodium chloride 0.9 % 100 mL IVPB  Status:  Discontinued        1 g 200 mL/hr over 30 Minutes Intravenous Every 24 hours 10/26/20 1056 10/27/20 0752   10/14/20 1815  Ampicillin-Sulbactam (UNASYN) 3 g in sodium chloride 0.9 % 100 mL IVPB        3 g 200 mL/hr over 30 Minutes Intravenous Every 6 hours 10/14/20 1754 10/19/20 1340   10/08/20 0845  ciprofloxacin (CIPRO) tablet 500 mg  Status:  Discontinued        500 mg Per Tube 2 times daily 10/08/20 0754 10/14/20 1731   09/21/20 1545  ceFAZolin (ANCEF) IVPB 1 g/50 mL premix        1 g 100 mL/hr over 30 Minutes Intravenous Every 8 hours 09/21/20 1457 09/28/20 0647   09/20/20 0600  ceFAZolin (ANCEF) IVPB 1 g/50 mL premix  Status:  Discontinued        1 g 100 mL/hr over 30 Minutes Intravenous Every 8 hours 09/14/20 0903 09/14/20 0904   09/16/20 1300  vancomycin (VANCOREADY) IVPB 1250 mg/250 mL  Status:  Discontinued        1,250 mg 166.7 mL/hr over 90 Minutes Intravenous Every 24 hours 09/15/20 1232 09/17/20 0956   09/15/20 1230  vancomycin (VANCOREADY) IVPB 1250 mg/250 mL        1,250 mg 166.7 mL/hr over 90 Minutes Intravenous  Once 09/15/20 1131 09/15/20 1402   09/13/20 1400  ceFEPIme (MAXIPIME) 2 g in sodium chloride 0.9 % 100 mL IVPB        2 g 200 mL/hr over 30 Minutes Intravenous Every 8 hours 09/13/20 1341 09/19/20 2208   09/06/20 0600  vancomycin (VANCOREADY) IVPB 1250 mg/250 mL        1,250 mg 166.7 mL/hr over 90 Minutes Intravenous  Once 09/06/20 0541 09/06/20 0739   09/05/20 1400  ceFAZolin (ANCEF) IVPB 1 g/50 mL premix  Status:  Discontinued        1 g 100 mL/hr over 30 Minutes Intravenous  Every 8 hours 09/05/20 1120 09/13/20 1341   08/30/20 0600  vancomycin (VANCOREADY) IVPB 500 mg/100 mL  Status:  Discontinued        500 mg 100 mL/hr over 60 Minutes Intravenous Every 12 hours 08/29/20 1554 09/01/20 1115   08/29/20 1700  ceFEPIme (MAXIPIME) 2 g in sodium chloride 0.9 % 100 mL IVPB  Status:  Discontinued        2 g 200 mL/hr over 30 Minutes Intravenous Every 12 hours 08/29/20 1554 09/01/20 1115   08/29/20 1645  vancomycin (VANCOREADY) IVPB 1250 mg/250 mL        1,250 mg 166.7 mL/hr over 90 Minutes Intravenous  Once 08/29/20 1554 08/29/20 1820       Subjective: Without complaints  Objective: Vitals:   01/31/21 0909 01/31/21 1100 01/31/21 1200 01/31/21 1521  BP:   (!) 149/97   Pulse: 94 85 87 91  Resp: '17 16 17   ' Temp:   98.7 F (37.1 C)   TempSrc:   Oral   SpO2: 100% 98% 99% 100%  Weight:      Height:        Intake/Output Summary (Last 24 hours) at 01/31/2021 1539 Last data filed at 01/31/2021 0848 Gross per 24 hour  Intake 50 ml  Output 900 ml  Net -850 ml    Filed Weights   01/23/21 0500 01/26/21 0500 01/28/21 0500  Weight: 61.5 kg 63.8 kg 62.5 kg    Examination: General exam: Conversant, in no acute distress Respiratory system: normal chest rise, clear, no audible wheezing Cardiovascular system: regular rhythm, s1-s2  Data Reviewed: I have personally reviewed following labs and imaging studies  CBC: Recent Labs  Lab 01/27/21 0905  WBC 5.5  NEUTROABS 3.2  HGB 10.5*  HCT 32.6*  MCV 77.6*  PLT 579    Basic Metabolic Panel: Recent Labs  Lab 01/27/21 0905  NA 138  K 3.3*  CL 101  CO2 29  GLUCOSE 149*  BUN 14  CREATININE 0.60  CALCIUM 9.4    GFR: Estimated Creatinine Clearance: 73.2 mL/min (by C-G formula based on SCr of 0.6 mg/dL). Liver Function Tests: Recent Labs  Lab 01/27/21 0905  AST 27  ALT 34  ALKPHOS 79  BILITOT 0.3  PROT 6.7  ALBUMIN 3.2*    No  results for input(s): LIPASE, AMYLASE in the last 168 hours. No  results for input(s): AMMONIA in the last 168 hours. Coagulation Profile: No results for input(s): INR, PROTIME in the last 168 hours. Cardiac Enzymes: No results for input(s): CKTOTAL, CKMB, CKMBINDEX, TROPONINI in the last 168 hours. BNP (last 3 results) No results for input(s): PROBNP in the last 8760 hours. HbA1C: No results for input(s): HGBA1C in the last 72 hours. CBG: Recent Labs  Lab 01/29/21 0742 01/29/21 1205 01/29/21 1604 01/29/21 2014 01/29/21 2325  GLUCAP 111* 123* 97 118* 106*    Lipid Profile: No results for input(s): CHOL, HDL, LDLCALC, TRIG, CHOLHDL, LDLDIRECT in the last 72 hours. Thyroid Function Tests: No results for input(s): TSH, T4TOTAL, FREET4, T3FREE, THYROIDAB in the last 72 hours. Anemia Panel: No results for input(s): VITAMINB12, FOLATE, FERRITIN, TIBC, IRON, RETICCTPCT in the last 72 hours. Sepsis Labs: No results for input(s): PROCALCITON, LATICACIDVEN in the last 168 hours.  No results found for this or any previous visit (from the past 240 hour(s)).   Radiology Studies: No results found.  Scheduled Meds:  acetaminophen  650 mg Per Tube Q6H   amLODipine  10 mg Per Tube Daily   atorvastatin  40 mg Per Tube Daily   baclofen  10 mg Per Tube 3 times per day   carvedilol  25 mg Per Tube BID WC   celecoxib  200 mg Per Tube BID   chlorhexidine gluconate (MEDLINE KIT)  15 mL Mouth Rinse BID   Chlorhexidine Gluconate Cloth  6 each Topical Daily   diclofenac Sodium  4 g Topical QID   doxazosin  2 mg Per Tube Daily   feeding supplement  237 mL Oral TID BM   feeding supplement (PROSource TF)  45 mL Per Tube Daily   fiber  1 packet Per Tube BID   free water  200 mL Per Tube Q4H   hydrALAZINE  37.5 mg Per Tube Q8H   insulin aspart  0-9 Units Subcutaneous Q4H   magic mouthwash w/lidocaine  10 mL Oral QID   mouth rinse  15 mL Mouth Rinse 5 X Daily   modafinil  200 mg Per Tube Daily   multivitamin with minerals  1 tablet Oral Daily   pantoprazole  sodium  40 mg Per Tube Daily   tiZANidine  4 mg Per Tube QHS   Continuous Infusions:  sodium chloride Stopped (10/26/20 1445)     LOS: 157 days   Marylu Lund, MD [facility] Pager On Amion  If 7PM-7AM, please contact night-coverage 01/31/2021, 3:39 PM

## 2021-02-01 DIAGNOSIS — I610 Nontraumatic intracerebral hemorrhage in hemisphere, subcortical: Secondary | ICD-10-CM

## 2021-02-01 LAB — COMPREHENSIVE METABOLIC PANEL
ALT: 24 U/L (ref 0–44)
AST: 19 U/L (ref 15–41)
Albumin: 3.5 g/dL (ref 3.5–5.0)
Alkaline Phosphatase: 76 U/L (ref 38–126)
Anion gap: 7 (ref 5–15)
BUN: 15 mg/dL (ref 6–20)
CO2: 30 mmol/L (ref 22–32)
Calcium: 9.7 mg/dL (ref 8.9–10.3)
Chloride: 102 mmol/L (ref 98–111)
Creatinine, Ser: 0.65 mg/dL (ref 0.44–1.00)
GFR, Estimated: >60 mL/min (ref >60)
Glucose, Bld: 98 mg/dL (ref 70–99)
Potassium: 3.3 mmol/L — ABNORMAL LOW (ref 3.5–5.1)
Sodium: 139 mmol/L (ref 135–145)
Total Bilirubin: 0.6 mg/dL (ref 0.3–1.2)
Total Protein: 7 g/dL (ref 6.5–8.1)

## 2021-02-01 LAB — CBC
HCT: 31.8 % — ABNORMAL LOW (ref 36.0–46.0)
Hemoglobin: 10.2 g/dL — ABNORMAL LOW (ref 12.0–15.0)
MCH: 25 pg — ABNORMAL LOW (ref 26.0–34.0)
MCHC: 32.1 g/dL (ref 30.0–36.0)
MCV: 77.9 fL — ABNORMAL LOW (ref 80.0–100.0)
Platelets: 301 10*3/uL (ref 150–400)
RBC: 4.08 MIL/uL (ref 3.87–5.11)
RDW: 15.5 % (ref 11.5–15.5)
WBC: 5.5 10*3/uL (ref 4.0–10.5)
nRBC: 0 % (ref 0.0–0.2)

## 2021-02-01 LAB — GLUCOSE, CAPILLARY
Glucose-Capillary: 101 mg/dL — ABNORMAL HIGH (ref 70–99)
Glucose-Capillary: 102 mg/dL — ABNORMAL HIGH (ref 70–99)
Glucose-Capillary: 104 mg/dL — ABNORMAL HIGH (ref 70–99)
Glucose-Capillary: 109 mg/dL — ABNORMAL HIGH (ref 70–99)
Glucose-Capillary: 112 mg/dL — ABNORMAL HIGH (ref 70–99)
Glucose-Capillary: 112 mg/dL — ABNORMAL HIGH (ref 70–99)
Glucose-Capillary: 113 mg/dL — ABNORMAL HIGH (ref 70–99)
Glucose-Capillary: 115 mg/dL — ABNORMAL HIGH (ref 70–99)
Glucose-Capillary: 117 mg/dL — ABNORMAL HIGH (ref 70–99)
Glucose-Capillary: 119 mg/dL — ABNORMAL HIGH (ref 70–99)
Glucose-Capillary: 82 mg/dL (ref 70–99)
Glucose-Capillary: 89 mg/dL (ref 70–99)
Glucose-Capillary: 89 mg/dL (ref 70–99)
Glucose-Capillary: 95 mg/dL (ref 70–99)
Glucose-Capillary: 99 mg/dL (ref 70–99)
Glucose-Capillary: 99 mg/dL (ref 70–99)

## 2021-02-01 NOTE — Progress Notes (Signed)
[facility] PROGRESS NOTE  KEIMANI LAUFER PIR:[id number] DOB: [birth year] DOA: 08/26/2020 PCP: Patient, [name] (Inactive)    October 08, 2020     10/16/2020                        10/16/2020     10/18/2020                        10/20/2020      12/30/2020                   12/30/2020   01/04/2021   Status: Remains inpatient appropriate because:Altered mental status and Unsafe d/c plan: Requirement to discharge to trach capable SNF   Dispo: The patient is from: Home              Anticipated d/c is to: SNF trach capable-[place]-we have recently discovered that the [facility] may have a trach bed available in clinicals have been sent to the facility for review -trach team re eval 6/3 and still rec NOT to decannulate 2/2 and lack of appropriate voluntary and involuntary cough response              Patient currently is medically stable to d/c.   Difficult to place patient Yes  **7/5: SW spoke directly with patient's Medicaid coordinator who confirmed that they do not need any additional information from the hospital regarding her application.  Unable to proceed any further until disability approved.   Level of care: Telemetry Medical  Code Status: Full Family Communication: [name] [phone]) is her POA and legal contact.  [name] by telephone on 7/11 DVT prophylaxis: Chronically bedbound.  5/6: We will discontinue prophylactic Lovenox   Vaccination status: Unvaccinated-husband still undecided.  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  Subsequently on 2/10, she developed obtundation and respiratory arrest leading to intubation.  She has remained persistently obtunded, now has a trach and a PEG.  Hospital course complicated by persistent fever, Serratia pneumonia and now pre-orbital cellulitis  As of 3/25 with adjustments in hypertonicity medications patient began to improve regarding hypertonicity.  Eventually started on provigil with improvements in alertness.  Evaluated by rehab physician who recommended increasing provigil dose further.  Subsequently patient has become more alert.  Most days she is able to track and with encouragement able to follow simple commands especially since partial obstruction glasses placed.  See progress note dictated 4/27 for expanded details.   2/09 Admit for [place] 2/10 EVD placed, later intubated due to obtundation and respiratory arrest 2/11 MRI Brain, hypertonic saline d/c'd, EVD not working.  Husband did not consent to PICC  2/13 EVD removed 2/17 [place] discussion with family, PMT 2/24 Bedside trach and PEG 3/1 [facility]  Subjective: Sleeping soundly this morning so I did not awaken.  Objective: Vitals:   02/01/21 0822 02/01/21 1102  BP:  132/80 (!) 144/92  Pulse: 74 85  Resp: 14 19  Temp:  98 F (36.7 C)  SpO2: 98% 100%    Intake/Output Summary (Last 24 hours) at 02/01/2021 1153 Last data filed at 02/01/2021 0600 Gross per 24 hour  Intake 600 ml  Output 600 ml  Net 0 ml     Filed Weights   01/23/21 0500 01/26/21 0500 01/28/21 0500  Weight: 61.5 kg 63.8 kg 62.5 kg    Exam:       Gen: Sleeping but at baseline is generally pleasant.  Currently appears to be in no acute distress Respiratory: #4.0 cuffless trach, trach collar with FiO2 21% at 5L, anterior lung sounds remain clear to auscultation without any  increased work of breathing.  Changed out on 7/17 Cardiovascular: S1-S2, no peripheral edema, regular pulse and normotensive. Abdomen:  PEG clamped except for installation of meds and free water when necessary LBM 7/17, diet has been variable mostly 50 to 100% of meals although on Sunday only ate 30% of meals. Neurologic: Sleeping but at baseline has persistent left facial droop.  Left side weakness with a degree of hemiparesis although she is able to wiggle fingers and toes inconsistently.  Purposeful movement of right upper extremity.  Right lower extremity difficult to test secondary to ongoing knee pain and patient's preference to keep hip and knees flexed.  Also suspect a degree of hearing loss associated with recent stroke. Psychiatric: Sleeping but at baseline is typically alert, tracks people in the room.  Slow to follow simple commands.  Phonates inconsistently and usually at a whispered tone  Assessment/Plan: Acute problems: Multiple acute strokes  Right [place] ICH with IVH  Obstructive Hydrocephalus/Acute on persistent metabolic Encephalopathy/Non TBI related hypertonicity -Continue baclofen and tizanidine for spasticity.  -Continue Provigil and rehabilitative therapies -Reevaluated by CIR (5/2 ) with recommendation for SNF -Continues to work well and consistently with therapies.   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 Carlis Abbott has had several discussions with patient's husband outlining current limitations for decannulation and uncertainty regarding this would even be possible in the future -At this time is not a candidate for decannulation  Recurrent UTI versus acute cystitis Evaluated by ID on 7/6 and not felt to be an acute UTI and instead cystitis. Treated with Septra for 3 days  Rhythmic activity of right side EEG negative for seizure activity  Colonic Ileus/recurrent  constipation/loose stools Constipation resolved-last bowel movement 7/4  Acute dental pain -CT maxillofacial without evidence of overt infection or abscess -Continue Orajel QID and Magic mouthwash with lidocaine for symptom  Recurrent right knee pain/known ligamentous injury and chronic osteoarthritis -s/p injection of Marcaine and Depomedrol 6/30 -Continue scheduled Celebrex, Tylenol and Ultram  Hypertensive emergency/Malignant HTN -Continue Norvasc, beta-blocker and hydralazine.  Dysphagia -Continue dysphagia 1 with thin liquids -Continue bolus feeds between meals with 1 can of Ensure TID -Discontinue CBG checks since no longer on continuous tube feeding  History of urinary retention -Continue Cardura    Other problems: Right upper extremity thrombophlebitis - [name] -> age indeterminate SVT of right cephalic vein  Neck Contracture/torticollis -- Resolved  ESBL positive Enterobacter UTI -Has completed 5 days of meropenem -Contact isolation during hospitalization -continue Oxybutynin suspected bladder spasms  Klebsiella UTI -Resistant to nitrofurantoin and ampicillin -Meropenem transitioned to Rocephin on 5/22 -To minimize risk of recurrent UTI have asked that pure wick only be used at bedtime on a 10 PM to  8 AM schedule  Recurrent diarrhea -Resolved -Likely combination of recent antibiotics as well as scheduled laxative -MiraLAX now as needed  Bacterial Conjunctivitis /periorbital cellulitis -Resolved  Chronic pansinusitis/recurrent fevers 2/2 aspiration PNA;History of Serratia Pneumonia  Hospital Acquired Pneumonia:  -Completed several courses of antibiotic -Follow-up imaging including sinus CT consistent with chronic but stable and resolving sinusitis   Anemia -Stable  HLD -Continue Lipitor      Data Reviewed: Basic Metabolic Panel: Recent Labs  Lab 01/27/21 0905 02/01/21 0146  NA 138 139  K 3.3* 3.3*  CL 101 102  CO2 29 30  GLUCOSE 149* 98   BUN 14 15  CREATININE 0.60 0.65  CALCIUM 9.4 9.7    Liver Function Tests: Recent Labs  Lab 01/27/21 0905 02/01/21 0146  AST 27 19  ALT 34 24  ALKPHOS 79 76  BILITOT 0.3 0.6  PROT 6.7 7.0  ALBUMIN 3.2* 3.5     CBC: Recent Labs  Lab 01/27/21 0905 02/01/21 0146  WBC 5.5 5.5  NEUTROABS 3.2  --   HGB 10.5* 10.2*  HCT 32.6* 31.8*  MCV 77.6* 77.9*  PLT 316 301     CBG: Recent Labs  Lab 01/31/21 1946 01/31/21 2352 02/01/21 0356 02/01/21 0831 02/01/21 1111  GLUCAP 112* 104* 89 82 117*     Studies: No results found.   Scheduled Meds:  acetaminophen  650 mg Per Tube Q6H   amLODipine  10 mg Per Tube Daily   atorvastatin  40 mg Per Tube Daily   baclofen  10 mg Per Tube 3 times per day   carvedilol  25 mg Per Tube BID WC   celecoxib  200 mg Per Tube BID   chlorhexidine gluconate (MEDLINE KIT)  15 mL Mouth Rinse BID   Chlorhexidine Gluconate Cloth  6 each Topical Daily   diclofenac Sodium  4 g Topical QID   doxazosin  2 mg Per Tube Daily   feeding supplement  237 mL Oral TID BM   feeding supplement (PROSource TF)  45 mL Per Tube Daily   fiber  1 packet Per Tube BID   free water  200 mL Per Tube Q4H   hydrALAZINE  37.5 mg Per Tube Q8H   insulin aspart  0-9 Units Subcutaneous Q4H   magic mouthwash w/lidocaine  10 mL Oral QID   mouth rinse  15 mL Mouth Rinse 5 X Daily   modafinil  200 mg Per Tube Daily   multivitamin with minerals  1 tablet Oral Daily   pantoprazole sodium  40 mg Per Tube Daily   tiZANidine  4 mg Per Tube QHS   Continuous Infusions:  sodium chloride Stopped (10/26/20 1445)    Principal Problem:   ICH (intracerebral hemorrhage) ([place]) Active Problems:   Acute hypoxemic respiratory failure (HCC)   Tracheostomy dependent ([place])   Palliative care by specialist   Muscle hypertonicity   Oropharyngeal dysphagia   Poor prognosis   Aspiration pneumonia of right lower lobe (HCC)   Infection due to extended spectrum beta lactamase (ESBL)  producing Enterobacteriaceae bacterium   Acute cystitis without hematuria   Ileus, unspecified ([place])   Constipation   Knee pain, right   Consultants: Neurology  Neurosurgery.  Palliative care.   PCCM continues to follow.  No decannulation.   Procedures: Tracheostomy PEG tube EEG Echocardiogram  Antibiotics: Vancomycin 2/12-2/15 Maxipime 2/12-2/15 Ancef 2/19-2/27 Vancomycin 2/20 x 1 dose Maxipime 2/27- 3/5 Vancomycin 3/1-3/3 Ancef 3/6-3/14 Cipro 3/20 4-3/30 Unasyn 3/30-4/4 Rocephin 4/11-4/12 Meropenem 4/12-4/17  Time spent: 15 minutes    Erin Hearing ANP  [facility] 7 am - 330 pm/M-F for direct patient care and secure chat Please refer to Amion for contact info 158  Days

## 2021-02-01 NOTE — Progress Notes (Signed)
   NAME:  Charlene PRINCIPATO, MRN:  [id number], DOB:  [birth year], LOS: 158 ADMISSION DATE:  08/26/2020, CONSULTATION DATE:  2/10 REFERRING MD:  Roda Shutters, CHIEF COMPLAINT:  headache   History of Present Illness:  55 year old female admitted with hypertensive R thalamic/basal ganglia ICH. Resultant obstructive hydrocephalus prompting EVD placement 2/10. Subsequently, 2/10 she had acute mental status change to obtundation and respiratory arrest resulting in intubation. Trach and Peg 2/24. Trach change to #6 cuffless on 3/11.   Pertinent  Medical History  Hypertension, Medication non-compliance  Significant Hospital Events: Including procedures, antibiotic start and stop dates in addition to other pertinent events   2/09 Admit for ICH 2/10 EVD placed, later intubated due to obtundation and respiratory arrest 2/11 MRI Brain, hypertonic saline d/c'd, EVD not working.  Husband did not consent to PICC  2/13 EVD removed 2/17 GOC discussion with family, PMT 2/24 tracheostomy placed by Dr. Denese Killings, PEG placed by Dr. Sheliah Hatch 3/11 trach changed to Shiley 6 uncuffed 3/21 sputum culture with Serratia resistant to Ancef 4/18 28% ATC / 5L  5/08 downsized trach to 4 7/11 No significant secretions / no issues with trach  Interim History / Subjective:  Afebrile  21% / 5L ATC   Objective   Blood pressure 139/89, pulse 84, temperature 98.6 F (37 C), temperature source Axillary, resp. rate 15, height 5\' 5"  (1.651 m), weight 62.5 kg, last menstrual period 03/03/2014, SpO2 100 %, unknown if currently breastfeeding.    FiO2 (%):  [21 %] 21 %   Intake/Output Summary (Last 24 hours) at 02/01/2021 0757 Last data filed at 02/01/2021 0600 Gross per 24 hour  Intake 650 ml  Output 600 ml  Net 50 ml   Filed Weights   01/23/21 0500 01/26/21 0500 01/28/21 0500  Weight: 61.5 kg 63.8 kg 62.5 kg    Examination: General: chronically ill appearing adult female lying in bed in NAD HEENT: MM pink/moist, #4 trach midline  c/d/i Neuro: sleeping / eyes closed, no response to voice, moves RUE spontaneously  CV: s1s2 RRR, no m/r/g PULM: non-labored at rest, lungs bilaterally clear GI: soft, bsx4 active  Extremities: warm/dry, no edema  Skin: no rashes or lesions   Resolved Hospital Problem list     Assessment & Plan:   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 -trach care per protocol  -not a candidate for decannulation at this time due to poor cough mechanics, deconditioning  -ATC for moisture -tracheal suctioning PRN  -continue SLP efforts  -PMV as tolerated  -follow intermittent CXR  -PT/OT efforts  PCCM will follow up again in week of 02/08/21 - call if assistance is needed sooner.   02/10/21, MSN, APRN, NP-C, AGACNP-BC [facility] 02/01/2021, 7:59 AM   Please see [email or web address] for pager details.   From 7A-7P if no response, please call [phone] After hours, please call ELink [phone]

## 2021-02-01 NOTE — Plan of Care (Signed)
  Problem: Ischemic Stroke/TIA Tissue Perfusion: Goal: Complications of ischemic stroke/TIA will be minimized Outcome: Progressing   Problem: Education: Goal: Knowledge of General Education information will improve Description: Including pain rating scale, medication(s)/side effects and non-pharmacologic comfort measures Outcome: Progressing   Problem: Clinical Measurements: Goal: Ability to maintain clinical measurements within normal limits will improve Outcome: Progressing Goal: Will remain free from infection Outcome: Progressing Goal: Diagnostic test results will improve Outcome: Progressing Goal: Respiratory complications will improve Outcome: Progressing Goal: Cardiovascular complication will be avoided Outcome: Progressing

## 2021-02-02 LAB — GLUCOSE, CAPILLARY
Glucose-Capillary: 100 mg/dL — ABNORMAL HIGH (ref 70–99)
Glucose-Capillary: 155 mg/dL — ABNORMAL HIGH (ref 70–99)
Glucose-Capillary: 116 mg/dL — ABNORMAL HIGH (ref 70–99)

## 2021-02-02 NOTE — Progress Notes (Signed)
  Speech Language Pathology Treatment: Dysphagia  Patient Details Name: Charlene [name] MRN: [id number] DOB: 05-02-1966 Today's Date: 02/02/2021 Time: 2505-3976 SLP Time Calculation (min) (ACUTE ONLY): 22 min  Assessment / Plan / Recommendation Clinical Impression  SLP arrived as pt was finishing her lunch meal with her mother assisting. Pt swallowed several sips of thin liquids with mild anterior spillage, but had more active expulsion of purees from her oral cavity. Per mother, this is often what pt starts to do when she is done with eating and drinking, otherwise not showing much difficulty with this throughout the meal. Pt also appeared to be distracted from POs, perhaps at least in part from what appeared to be pain in her R knee (RN made aware). Mild oral residue was suctioned from her mouth. Pt also assisted with suctioning, but needed assistance for initiation and to get the yankauer further into her mouth. Had planned to attempt trials of advanced solids, but given oral residue and decreased attention to food at the moment, will hold until subsequent visit. Pt/mother were updated on plan.    HPI HPI: Pt is 55 y/o female presents to [facility] on 2/9 with L sided weakness and headaches. PMH includes anxiety, HTN. 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 S/p Right frontal ventricular catheter placement on 2/10, stopped functioning on 2/11.  MRI 2/11 shows right greater than left mesial temporal lobes / parahippocampal cortex, bilateral basal ganglia, and possibly the upper pons that are concerning for acute infarcts, most likely secondary to mass effect from the hemorrhage. ETT 2/10; trach/PEG 09/10/20.  Trach changed to #6 cuffless 3/11; #4 cuffless 5/4. Has had two MBS studies, the last on 7/8 - pt started on dysphagia 1, thin liquids.      SLP Plan  Continue  with current plan of care       Recommendations  Diet recommendations: Dysphagia 1 (puree);Thin liquid Liquids provided via: Straw Medication Administration: Via alternative means Supervision: Staff to assist with self feeding;Trained caregiver to feed patient;Full supervision/cueing for compensatory strategies Compensations: Minimize environmental distractions Postural Changes and/or Swallow Maneuvers: Seated upright 90 degrees      Patient may use Passy-Muir Speech Valve: During all waking hours (remove during sleep);During PO intake/meals PMSV Supervision: Intermittent         Oral Care Recommendations: Oral care BID Follow up Recommendations: Skilled Nursing facility SLP Visit Diagnosis: Dysphagia, oropharyngeal phase (R13.12) Plan: Continue with current plan of care       GO                Mahala Menghini., M.A. CCC-SLP Acute Rehabilitation Services Pager [phone] Office [phone]  02/02/2021, 2:08 PM

## 2021-02-02 NOTE — Progress Notes (Signed)
Physical Therapy Treatment Patient Details Name: Charlene [name] MRN: [id number] DOB: November 13, 1965 Today's Date: 02/02/2021    History of Present Illness Pt is 55 y/o female presents to [facility] on 08/26/20 with L sided weakness and headaches. 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 s/p Right frontal ventricular catheter placement on 2/10, stopped functioning on 2/11.  MRI 2/11 shows right greater than left mesial temporal lobes / parahippocampal cortex, bilateral basal ganglia, and possibly the upper pons that are concerning for acute infarcts, most likely secondary to mass effect from the hemorrhage. ETT 2/10-2/24.  Pt with trach and PEG on 09/10/20. Hospital course complicated by persistent fever, Serratia pneumonia and now pre-orbital cellulitis.   PMH includes anxiety, HTN.    PT Comments    Pt very alert and interactive today, following commands given time in R UE for "high five" "thumbs up" and "wave bye".  Pt interacting in her environment more, attempting to move with therapist's initiation of movement and leaning forward to see and reach for items.  We were able to sit EOB and then lift OOB to [facility] for a short trip outside to encourage different stimuli (noise, visual, temperature).  Pt tolerated session well and left up in [facility] with mom, Charlene [name]'s supervision for eating lunch.  Mom to call RN staff before she leaves to get Charlene [name] back to bed and RN staff made aware.  PT will continue to follow acutely for safe mobility progression.     Follow Up Recommendations  SNF     Equipment Recommendations  Wheelchair (measurements PT);Wheelchair cushion (measurements PT);Hospital bed;Other (comment) (tilt in space WC, hospital bed with air mattress overlay, hoyer lift)    Recommendations for Other Services       Precautions / Restrictions Precautions Precautions:  Fall Precaution Comments: peg and trach Required Braces or Orthoses: Other Brace Other Brace: bil. PRAFOs, soft R knee extension splint and soft L elbow extension splint Restrictions Weight Bearing Restrictions: No    Mobility  Bed Mobility Overal bed mobility: Needs Assistance Bed Mobility: Rolling Rolling: Max assist;+2 for physical assistance Sidelying to sit: Max assist;+2 for physical assistance     Sit to sidelying: Max assist;+2 for physical assistance General bed mobility comments: Pt reaching, pulling and assisting with trunk flexion to come up to sitting today, also attempting weakly to pull legs back into bed.  Will follow therapists intiation of movment better than last session and much more alert and awake which I think is helpful all around.    Transfers                 General transfer comment: Lifted OOB to [facility] with two person assist for safety, pt tolerated well once in [facility] went outside for change of scenery, continues varied stimuli and attempts at command following, engaging Charlene [name] in conversation.  Ambulation/Gait                 Stairs             Wheelchair Mobility    Modified Rankin (Stroke Patients Only)       Balance Overall balance assessment: Needs assistance Sitting-balance support: Feet unsupported;Single extremity supported Sitting balance-Leahy Scale: Poor Sitting balance - Comments: light mod assist to maintain sitting EOB and when assist removed, pt attempting to hold herself up with right hand and trunk flexion, slowly leaning posteriorly without  support, but at least getting a balance trunk flexion reaction.  Worked on command following, tracking, cog treatment EOB for ~15 mins before lifting to the chair.                                    Cognition Arousal/Alertness: Awake/alert Behavior During Therapy: Flat affect Overall Cognitive Status: Impaired/Different from baseline Area of Impairment:  Attention;Following commands;Problem solving                   Current Attention Level: Sustained   Following Commands: Follows one step commands inconsistently;Follows one step commands with increased time     Problem Solving: Slow processing;Difficulty sequencing;Decreased initiation;Requires verbal cues;Requires tactile cues General Comments: Pt following therapist's initiation of movement more today with pulling of R arm and trunk flexion.  Looking around in her environment more, distracted by lines and her mother talking on the phone, looking around and attempting to pull herself forward in the [facility] against the armrests to get a better look at things while outside.  Trying to vocalize more twice repeating what therapist said.  Waving and thumbs up to command as well as high five, still inconsistant and often loses attention before she processes the command.      Exercises Other Exercises Other Exercises: PRAFOs donned for WC to assist in proper positioning and in protecting her skin from the hard parts of the leg rests.    General Comments General comments (skin integrity, edema, etc.): R elbow soft splint just applied by mom.  Bil legs remain in significant amount of bil knee flexion with hard end feel.  Not sure we are going to get this ROM back given it is so painful to attempt and Charlene [name] is more aware of the pain and guarded.      Pertinent Vitals/Pain Pain Assessment: Faces Faces Pain Scale: Hurts little more Pain Location: B knees, R worse than L Pain Descriptors / Indicators: Grimacing;Guarding Pain Intervention(s): Limited activity within patient's tolerance;Monitored during session;Repositioned    Home Living                      Prior Function            PT Goals (current goals can now be found in the care plan section) Progress towards PT goals: Progressing toward goals    Frequency    Min 2X/week      PT Plan Current plan remains appropriate     Co-evaluation PT/OT/SLP Co-Evaluation/Treatment: Yes Reason for Co-Treatment: Complexity of the patient's impairments (multi-system involvement);Necessary to address cognition/behavior during functional activity;For patient/therapist safety;To address functional/ADL transfers PT goals addressed during session: Mobility/safety with mobility;Balance;Strengthening/ROM        AM-PAC PT "6 Clicks" Mobility   Outcome Measure  Help needed turning from your back to your side while in a flat bed without using bedrails?: Total Help needed moving from lying on your back to sitting on the side of a flat bed without using bedrails?: Total Help needed moving to and from a bed to a chair (including a wheelchair)?: Total Help needed standing up from a chair using your arms (e.g., wheelchair or bedside chair)?: Total Help needed to walk in hospital room?: Total Help needed climbing 3-5 steps with a railing? : Total 6 Click Score: 6    End of Session   Activity Tolerance: Patient tolerated treatment well Patient left: in chair;with  call bell/phone within reach;with family/visitor present Nurse Communication: Mobility status;Need for lift equipment;Other (comment) PT Visit Diagnosis: Hemiplegia and hemiparesis;Other symptoms and signs involving the nervous system (R29.898);Other abnormalities of gait and mobility (R26.89) Hemiplegia - Right/Left: Right (bil LE, L UE) Hemiplegia - dominant/non-dominant: Dominant Hemiplegia - caused by: Cerebral infarction;Nontraumatic intracerebral hemorrhage;Other Nontraumatic intracranial hemorrhage     Time: 1117-1212 PT Time Calculation (min) (ACUTE ONLY): 55 min  Charges:  $Therapeutic Activity: 23-37 mins                     Corinna Capra, PT, DPT  Acute Rehabilitation Ortho Tech Supervisor [phone] pager [phone]) [phone] office

## 2021-02-02 NOTE — Plan of Care (Signed)
  Problem: Coping: Goal: Will identify appropriate support needs Outcome: Progressing   Problem: Intracerebral Hemorrhage Tissue Perfusion: Goal: Complications of Intracerebral Hemorrhage will be minimized Outcome: Progressing   Problem: Ischemic Stroke/TIA Tissue Perfusion: Goal: Complications of ischemic stroke/TIA will be minimized Outcome: Progressing

## 2021-02-02 NOTE — Progress Notes (Signed)
PROGRESS NOTE    Charlene [name]  OZD:[id number] DOB: 1965-09-10 DOA: 08/26/2020 PCP: Patient, [name] (Inactive)    Brief Narrative:  55 yo F with 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  Subsequently on 2/10, she developed obtundation and respiratory arrest leading to intubation. Hypertonic saline d/c'd. EVD not working, and removed on 2/13. She has remained persistently obtunded, now has a trach and a PEG.   Eventually started on provigil with improvements in alertness.  Evaluated by rehab physician who recommended increasing provigil dose further.  Subsequently patient has become more alert.  Most days she is able to track and with encouragement able to follow simple commands especially since partial obstruction glasses placed on left eye.  See progress note dictated 4/27 for expanded details.   Hospital course complicated by persistent fever, serratia pneumonia and now pre-orbital cellulitis for which she completed treatment. Medically stable for discharge once SNF with trach bed available.   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  -Continue Provigil per  PM&R -Continues to work well and consistently with therapies.   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 ESBL positive Enterobacter UTI on 4/8-completed 5 days of meropenem Klebsiella UTI on 5/20 and 7/4-completed antibiotic course with Septra on 7/7 per ID rec -Contact isolation during hospitalization   Rhythmic activity of right side: EEG negative for seizure activity   Colonic Ileus/recurrent constipation//diarrhea: Resolved   Acute dental pain: CT maxillofacial without evidence of overt infection or abscess -Continue Orajel QID and Magic mouthwash with lidocaine for symptom   Recurrent right knee pain/known ligamentous injury and chronic osteoarthritis -s/p injection of Marcaine and Depomedrol 6/30 -Continue scheduled Celebrex and Tylenol. -Wean off tramadol-could increase her risk of seizure   Hypertensive emergency/Malignant HTN: BP elevated today and yesterday. -Continue Norvasc and hydralazine  -Change metoprolol to Coreg for better BP control   Dysphagia-advance to dysphagia 1 diet by SLP -Begin bolus tube feeds between meals with Ensure and Prosource   History of urinary retention -Continue Cardura   Right upper extremity thrombophlebitis: [name] -> age indeterminate SVT of right cephalic vein -On SCD for VTE prophylaxis given significant ICH   Bacterial Conjunctivitis /periorbital cellulitis: Resolved   Chronic pansinusitis/Aspiration pneumonia/Hospital Acquired Pneumonia: -Completed multiple rounds of antibiotics   Anemia: H&H stable stable -Continue to monitor intermittently   Inadequate oral intake Body mass index is 22.93 kg/m. Nutrition Problem: Inadequate oral intake Etiology: inability to  eat Signs/Symptoms: NPO status Interventions: Tube feeding  DVT prophylaxis: SCD's Code Status: Full Family Communication: Pt in room, family is currently  at bedside  Status is: Inpatient  Remains inpatient appropriate because:Unsafe d/c plan  Dispo: The patient is from: Home              Anticipated d/c is to: SNF              Patient currently is medically stable to d/c. Just awaiting placement   Difficult to place patient Yes  Consultants:  Neurology Pulmonology PM&R Palliative medicine  Procedures:    Antimicrobials: Anti-infectives (From admission, onward)    Start     Dose/Rate Route Frequency Ordered Stop   01/20/21 1015  sulfamethoxazole-trimethoprim (BACTRIM) 200-40 MG/5ML suspension 20 mL        20 mL Per Tube Every 12 hours 01/20/21 0918 01/20/21 2210   01/20/21 0900  meropenem (MERREM) 1 g in sodium chloride 0.9 % 100 mL IVPB  Status:  Discontinued        1 g 200 mL/hr over 30 Minutes Intravenous Every 8 hours 01/20/21 0815 01/20/21 0917   01/18/21 1145  sulfamethoxazole-trimethoprim (BACTRIM) 200-40 MG/5ML suspension 20 mL  Status:  Discontinued        20 mL Per Tube Every 12 hours 01/18/21 1052 01/20/21 0742   01/18/21 1115  ciprofloxacin (CIPRO) 500 MG/5ML (10%) suspension 500 mg  Status:  Discontinued        500 mg Oral 2 times daily 01/18/21 1016 01/18/21 1051   12/19/20 1430  cefTRIAXone (ROCEPHIN) 1 g in sodium chloride 0.9 % 100 mL IVPB        1 g 200 mL/hr over 30 Minutes Intravenous Every 24 hours 12/19/20 1233 12/25/20 1711   12/18/20 1400  meropenem (MERREM) 1 g in sodium chloride 0.9 % 100 mL IVPB  Status:  Discontinued        1 g 200 mL/hr over 30 Minutes Intravenous Every 8 hours 12/18/20 1255 12/19/20 1233   12/06/20 1500  cefTRIAXone (ROCEPHIN) 1 g in sodium chloride 0.9 % 100 mL IVPB        1 g 200 mL/hr over 30 Minutes Intravenous Every 24 hours 12/06/20 1404 12/10/20 1500   12/04/20 1400  meropenem (MERREM) 1 g in sodium chloride 0.9 %  100 mL IVPB  Status:  Discontinued        1 g 200 mL/hr over 30 Minutes Intravenous Every 8 hours 12/04/20 1207 12/06/20 1404   10/27/20 0845  meropenem (MERREM) 1 g in sodium chloride 0.9 % 100 mL IVPB  Status:  Discontinued        1 g 200 mL/hr over 30 Minutes Intravenous Every 8 hours 10/27/20 0752 11/01/20 0720   10/26/20 1145  cefTRIAXone (ROCEPHIN) 1 g in sodium chloride 0.9 % 100 mL IVPB  Status:  Discontinued        1 g 200 mL/hr over 30 Minutes Intravenous Every 24 hours 10/26/20 1056 10/27/20 0752   10/14/20 1815  Ampicillin-Sulbactam (UNASYN) 3 g in sodium chloride 0.9 % 100 mL IVPB        3 g 200 mL/hr over 30 Minutes Intravenous Every 6 hours 10/14/20 1754 10/19/20 1340   10/08/20 0845  ciprofloxacin (CIPRO) tablet 500 mg  Status:  Discontinued        500 mg Per Tube 2 times daily 10/08/20 0754 10/14/20 1731   09/21/20 1545  ceFAZolin (ANCEF) IVPB 1 g/50 mL premix        1 g 100 mL/hr over 30 Minutes Intravenous Every 8 hours 09/21/20 1457 09/28/20 0647   09/20/20 0600  ceFAZolin (ANCEF) IVPB 1 g/50 mL premix  Status:  Discontinued        1 g 100 mL/hr over 30 Minutes Intravenous Every 8 hours 09/14/20 0903 09/14/20 0904   09/16/20 1300  vancomycin (VANCOREADY) IVPB 1250 mg/250 mL  Status:  Discontinued        1,250 mg 166.7 mL/hr over 90 Minutes Intravenous Every 24 hours 09/15/20 1232 09/17/20 0956   09/15/20 1230  vancomycin (VANCOREADY) IVPB 1250 mg/250 mL        1,250 mg 166.7 mL/hr over 90 Minutes Intravenous  Once 09/15/20 1131 09/15/20 1402   09/13/20 1400  ceFEPIme (MAXIPIME) 2 g in sodium chloride 0.9 % 100 mL IVPB        2 g 200 mL/hr over 30 Minutes Intravenous Every 8 hours 09/13/20 1341 09/19/20 2208   09/06/20 0600  vancomycin (VANCOREADY) IVPB 1250 mg/250 mL        1,250 mg 166.7 mL/hr over 90 Minutes Intravenous  Once 09/06/20 0541 09/06/20 0739   09/05/20 1400  ceFAZolin (ANCEF) IVPB 1 g/50 mL premix  Status:  Discontinued        1 g 100 mL/hr over 30  Minutes Intravenous Every 8 hours 09/05/20 1120 09/13/20 1341   08/30/20 0600  vancomycin (VANCOREADY) IVPB 500 mg/100 mL  Status:  Discontinued        500 mg 100 mL/hr over 60 Minutes Intravenous Every 12 hours 08/29/20 1554 09/01/20 1115   08/29/20 1700  ceFEPIme (MAXIPIME) 2 g in sodium chloride 0.9 % 100 mL IVPB  Status:  Discontinued        2 g 200 mL/hr over 30 Minutes Intravenous Every 12 hours 08/29/20 1554 09/01/20 1115   08/29/20 1645  vancomycin (VANCOREADY) IVPB 1250 mg/250 mL        1,250 mg 166.7 mL/hr over 90 Minutes Intravenous  Once 08/29/20 1554 08/29/20 1820       Subjective: Without complaints  Objective: Vitals:   02/02/21 0617 02/02/21 0834 02/02/21 1143 02/02/21 1516  BP: (!) 157/99 (!) 136/92  135/90  Pulse:  87 88 74  Resp:  _0 Temp:  98.2 F (36.8 C)    TempSrc:  Oral    SpO2:  100% 100% 96%  Weight:      Height:        Intake/Output Summary (Last 24 hours) at 02/02/2021 1549 Last data filed at 02/01/2021 2140 Gross per 24 hour  Intake --  Output 400 ml  Net -400 ml    Filed Weights   01/26/21 0500 01/28/21 0500 02/02/21 0500  Weight: 63.8 kg 62.5 kg 60.3 kg    Examination: General exam: Awake, laying in bed, in nad Respiratory system: Normal respiratory effort, no wheezing Cardiovascular system: regular rate, s1, s2  Data Reviewed: I have personally reviewed following labs and imaging studies  CBC: Recent Labs  Lab 01/27/21 0905 02/01/21 0146  WBC 5.5 5.5  NEUTROABS 3.2  --   HGB 10.5* 10.2*  HCT 32.6* 31.8*  MCV 77.6* 77.9*  PLT 316 993    Basic Metabolic Panel: Recent Labs  Lab 01/27/21 0905 02/01/21 0146  NA 138 139  K 3.3* 3.3*  CL 101 102  CO2 29 30  GLUCOSE 149* 98  BUN 14 15  CREATININE 0.60 0.65  CALCIUM 9.4 9.7    GFR: Estimated Creatinine Clearance: 73.2 mL/min (by C-G formula based on SCr of 0.65 mg/dL). Liver Function Tests: Recent Labs  Lab 01/27/21 0905 02/01/21 0146  AST 27 19  ALT 34  24  ALKPHOS 79 76  BILITOT 0.3 0.6  PROT 6.7 7.0  ALBUMIN 3.2* 3.5    No results for input(s): LIPASE, AMYLASE in the last 168 hours. No results for input(s): AMMONIA in the last 168 hours. Coagulation Profile: No results for input(s): INR, PROTIME in the last 168 hours. Cardiac Enzymes: No results for input(s): CKTOTAL, CKMB, CKMBINDEX, TROPONINI in the last 168 hours. BNP (last 3 results) No results for input(s): PROBNP in the last 8760 hours. HbA1C: No results for input(s): HGBA1C in the last 72 hours. CBG: Recent Labs  Lab 02/01/21 1940 02/01/21 2345 02/02/21 0329 02/02/21 0818 02/02/21 1225  GLUCAP 113* 95 100* 155* 116*    Lipid Profile: No results for input(s): CHOL, HDL, LDLCALC, TRIG, CHOLHDL, LDLDIRECT in the last 72 hours. Thyroid Function Tests: No results for input(s): TSH, T4TOTAL, FREET4, T3FREE, THYROIDAB in the last 72 hours. Anemia Panel: No results for input(s): VITAMINB12, FOLATE, FERRITIN, TIBC, IRON, RETICCTPCT in the last 72 hours. Sepsis Labs: No results for input(s): PROCALCITON, LATICACIDVEN in the last 168 hours.  No results found for this or any previous visit (from the past 240 hour(s)).   Radiology Studies: No results found.  Scheduled Meds:  acetaminophen  650 mg Per Tube Q6H   amLODipine  10 mg Per Tube Daily   atorvastatin  40 mg Per Tube Daily   baclofen  10 mg Per Tube 3 times per day   carvedilol  25 mg Per Tube BID WC   celecoxib  200 mg Per Tube BID   chlorhexidine gluconate (MEDLINE KIT)  15 mL Mouth Rinse BID   Chlorhexidine Gluconate Cloth  6 each Topical Daily   diclofenac Sodium  4 g Topical QID   doxazosin  2 mg Per Tube Daily   feeding supplement  237 mL Oral TID BM   feeding supplement (PROSource TF)  45 mL Per Tube Daily   fiber  1 packet Per Tube BID   free water  200 mL Per Tube Q4H   hydrALAZINE  37.5 mg Per Tube Q8H   magic mouthwash w/lidocaine  10 mL Oral QID   mouth rinse  15 mL Mouth Rinse 5 X Daily    modafinil  200 mg Per Tube Daily   multivitamin with minerals  1 tablet Oral Daily   pantoprazole sodium  40 mg Per Tube Daily   tiZANidine  4 mg Per Tube QHS   Continuous Infusions:  sodium chloride Stopped (10/26/20 1445)     LOS: 159 days   Marylu Lund, MD [facility] Pager On Amion  If 7PM-7AM, please contact night-coverage 02/02/2021, 3:49 PM

## 2021-02-02 NOTE — Progress Notes (Signed)
Occupational Therapy Treatment Patient Details Name: Charlene [name] MRN: [id number] DOB: 1966-03-29 Today's Date: 02/02/2021    History of present illness Pt is 55 y/o female presents to [facility] on 08/26/20 with L sided weakness and headaches. 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 s/p Right frontal ventricular catheter placement on 2/10, stopped functioning on 2/11.  MRI 2/11 shows right greater than left mesial temporal lobes / parahippocampal cortex, bilateral basal ganglia, and possibly the upper pons that are concerning for acute infarcts, most likely secondary to mass effect from the hemorrhage. ETT 2/10-2/24.  Pt with trach and PEG on 09/10/20. Hospital course complicated by persistent fever, Serratia pneumonia and now pre-orbital cellulitis.   PMH includes anxiety, HTN.   OT comments  Patient upright in bed upon OT/PT entry.  Engaged in EOB activities, following commands with increased time (high 5, thumbs up and waving) and reaching to touch/retrieve items from therapist.  Pt taking her glasses off several times during session, but noted improved focus and targeting with partial occlusion glasses on. She remains highly distracted by outside noises and lines. Lifted OOB to tilt in space WC for short trip outside. Greatly improved coordination of R UE throughout session, improved L scan and focus but fades with fatigue. Pt in wc with mom providing supervision for lunch.  Will continue to follow acutely.    Follow Up Recommendations  SNF;Supervision/Assistance - 24 hour    Equipment Recommendations  None recommended by OT    Recommendations for Other Services      Precautions / Restrictions Precautions Precautions: Fall Precaution Comments: peg and trach Required Braces or Orthoses: Other Brace Other Brace: bil. PRAFOs, soft R knee extension splint  and soft L elbow extension splint Restrictions Weight Bearing Restrictions: No       Mobility Bed Mobility Overal bed mobility: Needs Assistance Bed Mobility: Rolling Rolling: Max assist;+2 for physical assistance Sidelying to sit: Max assist;+2 for physical assistance     Sit to sidelying: Max assist;+2 for physical assistance General bed mobility comments: pt attempting to assist by reaching, pulling with UEs, and pulling LEs back to bed    Transfers                 General transfer comment: Lifted OOB to [facility] with two person assist for safety, pt tolerated well once in [facility] went outside for change of scenery, continues varied stimuli and attempts at command following, engaging [place] in conversation.    Balance Overall balance assessment: Needs assistance Sitting-balance support: Feet unsupported;Single extremity supported Sitting balance-Leahy Scale: Poor Sitting balance - Comments: EOB with mod assist from PT while engaging in command following, tracking and functional reaching with R UE.                                   ADL either performed or assessed with clinical judgement   ADL Overall ADL's : Needs assistance/impaired   Eating/Feeding Details (indicate cue type and reason): while outside, pt reaching for peach from her mother and coordinated hand to mouth with ease (did not eat any of the peach)                     Toilet Transfer: Total assistance;+2 for physical assistance;+2 for safety/equipment Toilet Transfer Details (indicate cue type  and reason): lift into w/c                 Vision   Additional Comments: Pt with R gaze preference, able to scan to locate on L side but requires cueing to attend to tasks.  Utilized "mothers day" board in room for visual scanning, pt with improved targeting with partial occlusion glasses on.   Perception     Praxis      Cognition Arousal/Alertness: Awake/alert Behavior During Therapy:  Flat affect Overall Cognitive Status: Impaired/Different from baseline Area of Impairment: Attention;Following commands;Problem solving                   Current Attention Level: Sustained   Following Commands: Follows one step commands inconsistently;Follows one step commands with increased time     Problem Solving: Slow processing;Difficulty sequencing;Decreased initiation;Requires verbal cues;Requires tactile cues General Comments: patient following more commands today, sitting up demonstrates ability to wave, give thumbs up, and high five.  She is easily distracted by lines and external noises, and requires redirection.        Exercises Other Exercises Other Exercises: PRAFOs donned for WC to assist in proper positioning and in protecting her skin from the hard parts of the leg rests.   Shoulder Instructions       General Comments L elbow splint donned by mom.    Pertinent Vitals/ Pain       Pain Assessment: Faces Faces Pain Scale: Hurts little more Pain Location: B knees, R worse than L Pain Descriptors / Indicators: Grimacing;Guarding Pain Intervention(s): Limited activity within patient's tolerance;Monitored during session;Repositioned  Home Living                                          Prior Functioning/Environment              Frequency  Min 2X/week        Progress Toward Goals  OT Goals(current goals can now be found in the care plan section)  Progress towards OT goals: Progressing toward goals  Acute Rehab OT Goals Patient Stated Goal: Pt unable to state  Plan Frequency remains appropriate;Discharge plan remains appropriate    Co-evaluation    PT/OT/SLP Co-Evaluation/Treatment: Yes Reason for Co-Treatment: Complexity of the patient's impairments (multi-system involvement);For patient/therapist safety;Necessary to address cognition/behavior during functional activity;To address functional/ADL transfers PT goals  addressed during session: Mobility/safety with mobility;Balance;Strengthening/ROM OT goals addressed during session: ADL's and self-care      AM-PAC OT "6 Clicks" Daily Activity     Outcome Measure   Help from another person eating meals?: A Little Help from another person taking care of personal grooming?: A Lot Help from another person toileting, which includes using toliet, bedpan, or urinal?: Total Help from another person bathing (including washing, rinsing, drying)?: Total Help from another person to put on and taking off regular upper body clothing?: Total Help from another person to put on and taking off regular lower body clothing?: Total 6 Click Score: 9    End of Session    OT Visit Diagnosis: Muscle weakness (generalized) (M62.81);Apraxia (R48.2);Cognitive communication deficit (R41.841);Hemiplegia and hemiparesis Symptoms and signs involving cognitive functions: Other Nontraumatic ICH Hemiplegia - Right/Left: Right Hemiplegia - 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

## 2021-02-03 LAB — TROPONIN I (HIGH SENSITIVITY): Troponin I (High Sensitivity): 5 ng/L (ref <18)

## 2021-02-03 NOTE — Progress Notes (Signed)
PROGRESS NOTE    Charlene [name]  KXF:[id number] DOB: [birth year] DOA: 08/26/2020 PCP: Patient, [name] (Inactive)    Brief Narrative:  55 yo F with 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  Subsequently on 2/10, she developed obtundation and respiratory arrest leading to intubation. Hypertonic saline d/c'd. EVD not working, and removed on 2/13. She has remained persistently obtunded, now has a trach and a PEG. Eventually started on provigil with improvements in alertness.  Evaluated by rehab physician who recommended increasing provigil dose further.  Subsequently patient has become more alert.  Most days she is able to track and with encouragement able to follow simple commands especially since partial obstruction glasses placed on left eye.  See progress note dictated 4/27 for expanded details. Hospital course complicated by persistent fever, serratia pneumonia and now pre-orbital cellulitis for which she completed treatment. Medically stable for discharge once SNF with trach bed available.  Subjective -Alert, nonverbal.  Not sure if she is tracking me with her eyes intentionally or not  Assessment & Plan:   Principal problem Nontraumatic TBI/right thalamic ICH with IVH Obstructive hydrocephalus Multiple acute strokes involving corona radiata, right BG and midbrain likely from [place] Acute metabolic encephalopathy -Continue baclofen 3 times daily and tizanidine at bedtime for spasticity.  -Continue Provigil per PM&R -Continues to work well and consistently with therapies.   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 ESBL positive  Enterobacter UTI on 4/8-completed 5 days of meropenem Klebsiella UTI on 5/20 and 7/4-completed antibiotic course with Septra on 7/7 per ID rec -Contact isolation during hospitalization   Rhythmic activity of right side: EEG negative for seizure activity   Colonic Ileus/recurrent constipation//diarrhea: Resolved   Acute dental pain: CT maxillofacial without evidence of overt infection or abscess -Continue Orajel QID and Magic mouthwash with lidocaine for symptom   Recurrent right knee pain/known ligamentous injury and chronic osteoarthritis -s/p injection of Marcaine and Depomedrol 6/30 -Continue scheduled Celebrex and Tylenol. -Wean off tramadol-could increase her risk of seizure   Hypertensive emergency/Malignant HTN: BP elevated today and yesterday. -Continue Norvasc and hydralazine  -Change metoprolol to Coreg for better BP control   Dysphagia-advance to dysphagia 1 diet by SLP -Begin bolus tube feeds between meals with Ensure and Prosource   History of urinary retention -Continue Cardura   Right upper extremity thrombophlebitis: [name] -> age indeterminate SVT of right cephalic vein -On SCD for VTE prophylaxis given significant ICH   Bacterial Conjunctivitis /periorbital cellulitis: Resolved   Chronic pansinusitis/Aspiration pneumonia/Hospital Acquired Pneumonia: -Completed multiple rounds of antibiotics   Anemia: H&H stable stable -Continue to monitor intermittently   Inadequate oral intake Body mass index is 22.93 kg/m. Nutrition Problem: Inadequate oral intake Etiology: inability to eat Signs/Symptoms: NPO status Interventions: Tube feeding  DVT prophylaxis: SCD's Code Status: Full Family Communication: no family at bedside   Status is: Inpatient  Remains inpatient appropriate because:Unsafe d/c plan  Dispo: The patient is from: Home              Anticipated d/c is to: SNF              Patient currently is medically stable to d/c. Just awaiting placement    Difficult to place patient Yes  Consultants:  Neurology Pulmonology PM&R Palliative medicine  Procedures:    Antimicrobials: None    Objective: Vitals:   02/02/21 2049 02/03/21 0001 02/03/21 0340 02/03/21 0818  BP: (!) 148/91 129/86 133/84 (!) 144/83  Pulse: 90 72 73 95  Resp: _0 Temp: 99.4 F (37.4 C) 98.2 F (36.8 C) 98 F (36.7 C)   TempSrc: Oral Axillary Axillary   SpO2: 98% 99% 99% 100%  Weight:      Height:        Intake/Output Summary (Last 24 hours) at 02/03/2021 1110 Last data filed at 02/03/2021 0343 Gross per 24 hour  Intake --  Output 600 ml  Net -600 ml    Filed Weights   01/26/21 0500 01/28/21 0500 02/02/21 0500  Weight: 63.8 kg 62.5 kg 60.3 kg    Examination: General exam: NAD Respiratory system: CTA Cardiovascular system: RRR  Data Reviewed: I have personally reviewed following labs and imaging studies  CBC: Recent Labs  Lab 02/01/21 0146  WBC 5.5  HGB 10.2*  HCT 31.8*  MCV 77.9*  PLT 947    Basic Metabolic Panel: Recent Labs  Lab 02/01/21 0146  NA 139  K 3.3*  CL 102  CO2 30  GLUCOSE 98  BUN 15  CREATININE 0.65  CALCIUM 9.7    GFR: Estimated Creatinine Clearance: 73.2 mL/min (by C-G formula based on SCr of 0.65 mg/dL). Liver Function Tests: Recent Labs  Lab 02/01/21 0146  AST 19  ALT 24  ALKPHOS 76  BILITOT 0.6  PROT 7.0  ALBUMIN 3.5    No results for input(s): LIPASE, AMYLASE in the last 168 hours. No results for input(s): AMMONIA in the last 168 hours. Coagulation Profile: No results for input(s): INR, PROTIME in the last 168 hours. Cardiac Enzymes: No results for input(s): CKTOTAL, CKMB, CKMBINDEX, TROPONINI in the last 168 hours. BNP (last 3 results) No results for input(s): PROBNP in the last 8760 hours. HbA1C: No results for input(s): HGBA1C in the last 72 hours. CBG: Recent Labs  Lab 02/01/21 1940 02/01/21 2345 02/02/21 0329 02/02/21 0818 02/02/21 1225  GLUCAP 113* 95 100* 155*  116*    Lipid Profile: No results for input(s): CHOL, HDL, LDLCALC, TRIG, CHOLHDL, LDLDIRECT in the last 72 hours. Thyroid Function Tests: No results for input(s): TSH, T4TOTAL, FREET4, T3FREE, THYROIDAB in the last 72 hours. Anemia Panel: No results for input(s): VITAMINB12, FOLATE, FERRITIN, TIBC, IRON, RETICCTPCT in the last 72 hours. Sepsis Labs: No results for input(s): PROCALCITON, LATICACIDVEN in the last 168 hours.  No results found for this or any previous visit (from the past 240 hour(s)).   Radiology Studies: No results found.  Scheduled Meds:  acetaminophen  650 mg Per Tube Q6H   amLODipine  10 mg Per Tube Daily   atorvastatin  40 mg Per Tube Daily   baclofen  10 mg Per Tube 3 times per day   carvedilol  25 mg Per Tube BID WC   celecoxib  200 mg Per Tube BID   chlorhexidine gluconate (MEDLINE KIT)  15 mL Mouth Rinse BID   Chlorhexidine Gluconate Cloth  6 each Topical Daily   diclofenac Sodium  4 g Topical QID   doxazosin  2 mg Per Tube Daily   feeding supplement  237 mL Oral TID BM   feeding supplement (PROSource TF)  45 mL Per Tube Daily   fiber  1 packet Per Tube BID   free water  200 mL Per Tube Q4H  hydrALAZINE  37.5 mg Per Tube Q8H   magic mouthwash w/lidocaine  10 mL Oral QID   mouth rinse  15 mL Mouth Rinse 5 X Daily   modafinil  200 mg Per Tube Daily   multivitamin with minerals  1 tablet Oral Daily   pantoprazole sodium  40 mg Per Tube Daily   tiZANidine  4 mg Per Tube QHS   Continuous Infusions:  sodium chloride Stopped (10/26/20 1445)     LOS: 160 days   Marzetta Board, MD [facility] Pager On [place]  If 7PM-7AM, please contact night-coverage 02/03/2021, 11:10 AM

## 2021-02-03 NOTE — Plan of Care (Signed)
  Problem: Coping: Goal: Will identify appropriate support needs Outcome: Progressing   Problem: Health Behavior/Discharge Planning: Goal: Ability to manage health-related needs will improve Outcome: Progressing   Problem: Self-Care: Goal: Verbalization of feelings and concerns over difficulty with self-care will improve Outcome: Progressing Goal: Ability to communicate needs accurately will improve Outcome: Progressing   Problem: Nutrition: Goal: Risk of aspiration will decrease Outcome: Progressing   Problem: Intracerebral Hemorrhage Tissue Perfusion: Goal: Complications of Intracerebral Hemorrhage will be minimized Outcome: Progressing   Problem: Education: Goal: Knowledge of General Education information will improve Description: Including pain rating scale, medication(s)/side effects and non-pharmacologic comfort measures Outcome: Progressing   Problem: Health Behavior/Discharge Planning: Goal: Ability to manage health-related needs will improve Outcome: Progressing   Problem: Clinical Measurements: Goal: Ability to maintain clinical measurements within normal limits will improve Outcome: Progressing Goal: Will remain free from infection Outcome: Progressing Goal: Diagnostic test results will improve Outcome: Progressing Goal: Respiratory complications will improve Outcome: Progressing Goal: Cardiovascular complication will be avoided Outcome: Progressing   Problem: Coping: Goal: Level of anxiety will decrease Outcome: Progressing   Problem: Elimination: Goal: Will not experience complications related to bowel motility Outcome: Progressing Goal: Will not experience complications related to urinary retention Outcome: Progressing   Problem: Pain Managment: Goal: General experience of comfort will improve Outcome: Progressing   Problem: Skin Integrity: Goal: Risk for impaired skin integrity will decrease Outcome: Progressing   Problem: Intracerebral  Hemorrhage Tissue Perfusion: Goal: Complications of Intracerebral Hemorrhage will be minimized Outcome: Progressing   Problem: Ischemic Stroke/TIA Tissue Perfusion: Goal: Complications of ischemic stroke/TIA will be minimized Outcome: Progressing   

## 2021-02-03 NOTE — Progress Notes (Signed)
Cardiac monitoring service called, seeing ST elevation in V lead and MCL lead. Patient witnessed rubbing knees. Given pain medication and made MD aware.   Awaiting possible new orders.

## 2021-02-03 NOTE — Progress Notes (Signed)
Nutrition Follow-up  DOCUMENTATION CODES:  Not applicable  INTERVENTION:  -Recommend discontinuing TF orders/Prosource TF given pt with adequate PO intake -Continue Ensure Enlive TID, each supplement provides 350 kcal and 20 grams of protein -MVI with minerals daily  NUTRITION DIAGNOSIS:  Inadequate oral intake related to inability to eat as evidenced by NPO status. 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  Subsequently on 2/10, pt developed obtundation and respiratory arrest leading to intubation. Pt is now s/p trach/PEG placement. Hospital course complicated by Serratia pneumonia, ESBL Enterobacter UTI and pre-orbital cellulitis.   2/10 s/p EVD placement and intubation for respiratory arrest 2/13 EVD removed 2/17 palliative care consult; family requesting tx to [facility]  2/24 s/p trach and PEG  3/1 tx to [facility] 3/11 trach changed to shiley #6 cuffless 4/08 pt w/ sepsis 2/2 UTI 4/18 28% ATC/5L 5/2 CIR reevaluated pt and determined she has not made enough improvement for admit; recommended SNF 5/8 trach changed to shiley #4 cuffless  5/23 CCM evaluated and determined pt stilll unable to decannulate due to variable mental status 6/3 trach exchanged, still shiley #4 cuffless 6/30 s/p R knee aspiration and injection 7/3 trach exchanged, still shiley #4 cuffless 7/8 diet advanced to dysphagia 1 with thin liquids  7/17 trach exchanged, still shiley #4 cuffless  Pt is alert but remains nonverbal. Per MD, pt is medically stable for discharge once trach capable  facility has an available bed and pt is still not a candidate for decannulation. Discussed pt with RN who reports pt has had continued good PO intake since last RD assessment. Meal completions charted as 30-100% x 8 recorded meals (~71% average meal intake). Pt tolerating Ensure. Recommend discontinuing TF orders (free water flushes should be maintained for tube patency until it is removed).   UOP: 699m + 1x unmeasured occurrence x24 hours Stool: 1x unmeasured occurrence x24 hours  Admit weight: 61.2 kg  Current weight: 60.3 kg  Medications: Scheduled Meds:  acetaminophen  650 mg Per Tube Q6H   amLODipine  10 mg Per Tube Daily   atorvastatin  40 mg Per Tube Daily   baclofen  10 mg Per Tube 3 times per day   carvedilol  25 mg Per Tube BID WC   celecoxib  200 mg Per Tube BID   chlorhexidine gluconate (MEDLINE KIT)  15 mL Mouth Rinse BID   Chlorhexidine Gluconate Cloth  6 each Topical Daily   diclofenac Sodium  4 g Topical QID   doxazosin  2 mg Per Tube Daily   feeding supplement  237 mL Oral TID BM   feeding supplement (PROSource TF)  45 mL Per Tube Daily   fiber  1 packet Per Tube BID   free water  200 mL Per Tube Q4H   hydrALAZINE  37.5 mg Per Tube Q8H   magic mouthwash w/lidocaine  10 mL Oral QID   mouth rinse  15 mL Mouth Rinse 5 X Daily   modafinil  200 mg Per Tube Daily   multivitamin with minerals  1 tablet Oral Daily   pantoprazole sodium  40 mg Per Tube Daily  tiZANidine  4 mg Per Tube QHS   Labs: Recent Labs  Lab 02/01/21 0146  NA 139  K 3.3*  CL 102  CO2 30  BUN 15  CREATININE 0.65  CALCIUM 9.7  GLUCOSE 98  CBGs 100-155-116  Diet Order:   Diet Order             DIET - DYS 1 Room service appropriate? No; Fluid consistency: Thin  Diet effective now                  EDUCATION NEEDS:  No education needs have been identified at this time  Skin:  Skin Assessment: Reviewed RN Assessment  Last BM:  7/19  Height:  Ht Readings from Last 1 Encounters:   10/18/20 '5\' 5"'  (1.651 m)   Weight:  Wt Readings from Last 1 Encounters:  02/02/21 60.3 kg   Ideal Body Weight:  56.8 kg  BMI:  Body mass index is 22.12 kg/m.  Estimated Nutritional Needs:  Kcal:  1600-1800 Protein:  80-90 grams Fluid:  >1.6L/d    Larkin Ina, MS, RD, LDN RD pager number and weekend/on-call pager number located in [place].

## 2021-02-04 ENCOUNTER — Inpatient Hospital Stay (HOSPITAL_COMMUNITY): Payer: Medicaid Other

## 2021-02-04 DIAGNOSIS — R9431 Abnormal electrocardiogram [ECG] [EKG]: Secondary | ICD-10-CM | POA: Insufficient documentation

## 2021-02-04 LAB — ECHOCARDIOGRAM COMPLETE
Area-P 1/2: 4.06 cm2
Height: 65 in
S' Lateral: 2.3 cm
Weight: 2127 oz

## 2021-02-04 LAB — TROPONIN I (HIGH SENSITIVITY): Troponin I (High Sensitivity): 4 ng/L (ref <18)

## 2021-02-04 LAB — PHOSPHORUS: Phosphorus: 3.6 mg/dL (ref 2.5–4.6)

## 2021-02-04 LAB — GLUCOSE, CAPILLARY: Glucose-Capillary: 125 mg/dL — ABNORMAL HIGH (ref 70–99)

## 2021-02-04 MED ORDER — JEVITY 1.5 CAL/FIBER PO LIQD
900.0000 mL | ORAL | Status: DC
  Administered 2021-02-04 – 2021-02-22 (×19): 900 mL
  Filled 2021-02-04 (×21): qty 948

## 2021-02-04 MED ORDER — ENSURE ENLIVE PO LIQD
237.0000 mL | Freq: Two times a day (BID) | ORAL | Status: DC
  Administered 2021-02-05 – 2021-02-15 (×21): 237 mL via ORAL
  Filled 2021-02-04 (×3): qty 237

## 2021-02-04 MED ORDER — MAGIC MOUTHWASH W/LIDOCAINE
10.0000 mL | Freq: Four times a day (QID) | ORAL | Status: DC | PRN
  Administered 2021-02-04: 10 mL via ORAL
  Filled 2021-02-04 (×2): qty 10

## 2021-02-04 NOTE — TOC Progression Note (Signed)
Transition of Care [facility]) - Progression Note    Patient Details  Name: MAURISA TESMER MRN: [id number] Date of Birth: [birth year]  Transition of Care [facility]) CM/SW [place], RN Phone Number: 02/04/2021, 2:34 PM  Clinical Narrative:    Case management spoke with Erin Hearing, NP and she states that the husband, [name], had questions about area skilled nursing home facilities in the area.  I met with the patient and husband at the bedside and the husband was provided with a CMS list of skilled nursing home facilities.  [name] was given a copy of the list and instructions on how to navigate the CMS website to search skilled nursing facilities with available contact numbers.    [name] states that he met with the social security office this week and provided required documents to start Medicaid application.  CM and MSW with DTP Team continue to follow the patient for transitions of care needs.   Expected Discharge Plan: Skilled Nursing Facility Barriers to Discharge: Continued Medical Work up  Expected Discharge Plan and Services Expected Discharge Plan: [place] In-house Referral: Clinical Social Work Discharge Planning Services: CM Consult Post Acute Care Choice: [place] arrangements for the past 2 months: Single Family Home                                       Social Determinants of Health (SDOH) Interventions    Readmission Risk Interventions Readmission Risk Prevention Plan 09/29/2020  Transportation Screening Complete  PCP or Specialist Appt within 3-5 Days Complete  HRI or [place] Complete  Social Work Consult for [place] Planning/Counseling Complete  Palliative Care Screening Complete  Medication Review Press photographer) Complete  Some recent data might be hidden

## 2021-02-04 NOTE — Progress Notes (Signed)
Pt removed trach at 0927. RN and NT stayed with patient. RT and charge were called. Pts O2 sat stayed above 95. RT replaced trach. Pt tolerated time without trach well. Pt tolerated replacement well. Pt is resting. O2 sats are 100% at this time. MD will be updated. Will continue to monitor.

## 2021-02-04 NOTE — Progress Notes (Signed)
SLP Cancellation Note  Patient Details Name: Charlene [name] MRN: [id number] DOB: 11-Jul-1966   Cancelled treatment:       Reason Eval/Treat Not Completed: Patient at procedure or test/unavailable (Pt is currently working with PT. SLP will follow up.)  Saryn Cherry I. Vear Clock, MS, CCC-SLP Acute Rehabilitation Services Office number [phone] Pager [phone]  Scheryl Marten 02/04/2021, 2:04 PM

## 2021-02-04 NOTE — Progress Notes (Signed)
Nutrition Follow-up  DOCUMENTATION CODES:  Not applicable  INTERVENTION:  -Decrease to Ensure Enlive BID, each supplement provides 350 kcal and 20 grams of protein -Continue MVI with minerals daily  Begin nocturnal TF via PEG:  -Provide Jevity 1.5 @ 39m/hr for 12 hours from 2000-0800 (9063mtotal) Nocturnal TF regimen will provide 1350 kcals, 57 grams protein, 68467mree water (Meets ~84% and ~72% minimum estimated calorie and protein needs, respectively)  -continue 200m77mee water flushes Q4H (provides 1884ml30mal free water with flushes and TF)  NUTRITION DIAGNOSIS:  Inadequate oral intake related to inability to eat as evidenced by NPO status. 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  Subsequently on 2/10, pt developed obtundation and respiratory arrest leading to intubation. Pt is now s/p trach/PEG placement. Hospital course complicated by Serratia pneumonia, ESBL Enterobacter UTI and pre-orbital cellulitis.   2/10 s/p EVD placement and intubation for respiratory arrest 2/13 EVD removed 2/17 palliative care consult; family requesting tx to [facility]  2/24 s/p trach and PEG  3/1 [facility] trach changed to shiley #6 cuffless 4/08 pt w/ sepsis 2/2 UTI 4/18 28% ATC/5L 5/2 CIR reevaluated pt and determined she has not made enough improvement for admit; recommended SNF 5/8 trach changed to shiley #4 cuffless  5/23 CCM evaluated and determined pt stilll unable to decannulate due to variable mental status 6/3 trach exchanged, still shiley #4  cuffless 6/30 s/p R knee aspiration and injection 7/3 trach exchanged, still shiley #4 cuffless 7/8 diet advanced to dysphagia 1 with thin liquids  7/17 trach exchanged, still shiley #4 cuffless  Discussed pt in detail with RN today. Pt initially had excellent PO intake when diet was first advanced, but has been having consistently declining PO intake over the last few days. Discussed beginning nocturnal TF again to meet the majority of pt's needs while still allowing for adequate PO intake during the day time. Will decrease Ensure to BID as RN reports pt is drinking them, but not completing them. Hopeful intake will improve so would like for pt to continue to have option to utilize supplements in addition to TF. Staff should encourage PO intake and assist with meals/supplements as needed. Once PO intake/mentation show signs of improvement, will initiate calorie count.   Medications:  acetaminophen  650 mg Per Tube Q6H   amLODipine  10 mg Per Tube Daily   atorvastatin  40 mg Per Tube Daily   baclofen  10 mg Per Tube 3 times per day   carvedilol  25 mg Per Tube BID WC   celecoxib  200 mg Per Tube BID   chlorhexidine gluconate (MEDLINE KIT)  15 mL Mouth Rinse BID   Chlorhexidine Gluconate Cloth  6 each Topical Daily   diclofenac Sodium  4 g Topical QID   doxazosin  2 mg Per Tube Daily   [START ON 02/05/2021] feeding supplement  237 mL Oral BID BM   feeding supplement (JEVITY 1.5 CAL/FIBER)  900 mL Per Tube Q24H   fiber  1 packet Per Tube BID   free water  200 mL Per Tube Q4H  hydrALAZINE  37.5 mg Per Tube Q8H   mouth rinse  15 mL Mouth Rinse 5 X Daily   modafinil  200 mg Per Tube Daily   multivitamin with minerals  1 tablet Oral Daily   pantoprazole sodium  40 mg Per Tube Daily   tiZANidine  4 mg Per Tube QHS   Labs: Recent Labs  Lab 02/01/21 0146  NA 139  K 3.3*  CL 102  CO2 30  BUN 15  CREATININE 0.65  CALCIUM 9.7  GLUCOSE 98    Diet Order:   Diet Order              DIET - DYS 1 Room service appropriate? No; Fluid consistency: Thin  Diet effective now                  EDUCATION NEEDS:  No education needs have been identified at this time  Skin:  Skin Assessment: Reviewed RN Assessment  Last BM:  7/19  Height:  Ht Readings from Last 1 Encounters:  10/18/20 _0  (1.651 m)   Weight:  Wt Readings from Last 1 Encounters:  02/02/21 60.3 kg   Ideal Body Weight:  56.8 kg  BMI:  Body mass index is 22.12 kg/m.  Estimated Nutritional Needs:  Kcal:  1600-1800 Protein:  80-90 grams Fluid:  >1.6L/d    Larkin Ina, MS, RD, LDN RD pager number and weekend/on-call pager number located in [place].

## 2021-02-04 NOTE — Progress Notes (Addendum)
  Speech Language Pathology Treatment: Dysphagia  Patient Details Name: Charlene [name] MRN: [id number] DOB: 09/23/1965 Today's Date: 02/04/2021 Time: 9390-3009 SLP Time Calculation (min) (ACUTE ONLY): 27 min  Assessment / Plan / Recommendation Clinical Impression  Pt was seen for treatment with her husband present for part of the session. PMSV was in place throughout the session and she tolerated it with stable vitals. Pt vocalized with low vocal intensity and reduced articulatory precision which together negatively impacted speech intelligibility. Pt produced "I love you too" without voicing in response to her husband stating, "I love you". Pt did not improve vocal intensity or produce speech during other structured tasks despite cues. Pt became more animated and verbal output was increased when she was shown the photo poster board in her room, but utterances were unintelligible. Pt tolerated puree solids, ice chips, and thin liquids via straw without overt s/sx of aspiration. Labial stripping was reduced and anterior spillage was noted to the left with larger boluses of puree. Multiple trials of dysphagia 2 solids were given, but mandibular movement was minimal, and boluses remained unmasticated despite verbal and tactile prompts. Dysphagia 2 solids were pocketed in the left lateral sulcus, remained on the superior lingual surface, or were propelled anteriorly out of the oral cavity. Remaining dysphagia 2 boluses were removed from the pt's oral cavity by SLP. Pt does not present as a candidate for diet advancement at this time. Her current diet of dysphagia 1 solids and thin liquids will therefore be continued. PMSV may continue to be used during all waking hours and removed when pt is asleep. SLP will continue to follow pt.    HPI HPI: Pt is 55 y/o female presents [facility] on [facility]9 with L sided weakness and headaches. PMH includes anxiety, HTN. CT on 2/10 shows right thalamic ICH extending into the  intraventricular third and fourth ventricles and nearly filling the right lateral ventricle with no overt sign of hydrocephalus. S/p Right frontal ventricular catheter placement on 2/10, stopped functioning on 2/11.  MRI 2/11 shows right greater than left mesial temporal lobes / parahippocampal cortex, bilateral basal ganglia, and possibly the upper pons that are concerning for acute infarcts, most likely secondary to mass effect from the hemorrhage. ETT 2/10; trach/PEG 09/10/20.  Trach changed to #6 cuffless 3/11; #4 cuffless 5/4. Has had two MBS studies, the last on 7/8 - pt started on dysphagia 1, thin liquids. Trach replaced following self-decannulation 7/21.      SLP Plan  Continue with current plan of care       Recommendations  Diet recommendations: Dysphagia 1 (puree);Thin liquid Liquids provided via: Straw Medication Administration: Via alternative means Supervision: Staff to assist with self feeding;Trained caregiver to feed patient;Full supervision/cueing for compensatory strategies Compensations: Minimize environmental distractions;Small sips/bites Postural Changes and/or Swallow Maneuvers: Seated upright 90 degrees      Patient may use Passy-Muir Speech Valve: During all waking hours (remove during sleep);During PO intake/meals PMSV Supervision: Intermittent         Oral Care Recommendations: Oral care BID Follow up Recommendations: Skilled Nursing facility SLP Visit Diagnosis: Dysphagia, oropharyngeal phase (R13.12) Plan: Continue with current plan of care       Demetri Goshert I. Vear Clock, MS, CCC-SLP Acute Rehabilitation Services Office number [phone] Pager [phone]                Scheryl Marten 02/04/2021, 4:39 PM

## 2021-02-04 NOTE — Progress Notes (Addendum)
[facility] PROGRESS NOTE  Charlene [name] AVW:[id number] DOB: 02-18-1966 DOA: 08/26/2020 PCP: Patient, [name] (Inactive)    October 08, 2020     10/16/2020                        10/16/2020     10/18/2020                        10/20/2020      12/30/2020                   12/30/2020   01/04/2021   Status: Remains inpatient appropriate because:Altered mental status and Unsafe d/c plan: Requirement to discharge to trach capable SNF   Dispo: The patient is from: Home              Anticipated d/c is to: SNF trach capable-[place]-we have recently discovered that the [facility] may have a trach bed available in clinicals have been sent to the facility for review -trach team re eval 6/3 and still rec NOT to decannulate 2/2 and lack of appropriate voluntary and involuntary cough response              Patient currently is medically stable to d/c.   Difficult to place patient Yes   Level of care: Telemetry Medical  Code Status: Full Family Communication: [name] [phone]) is her POA and legal contact.  [name] at bedside on 7/21.  He requested a list of local facilities trachs/the ones we have previously faxed patient information out to. DVT prophylaxis: Chronically bedbound.  5/6: We will discontinue prophylactic Lovenox   Vaccination status: Unvaccinated-husband still undecided.  Micro Data:  2/9 COVID + Influenza negative 2/10 MRSA PCR negative 2/12 BCx 2 > no growth  2/19 BC 1/2 +Stap EPI, 1/2 no growth  10/07/2020 sputum positive for Serratia marcescens 4/8 Enterobacter cloaca + urine culture MDR sensitive only to imipenem and gentamicin   Antimicrobials:  Cefepime 2/12 > 2/15 Vanc 2/12 > 2/15 10/08/2020 ciprofloxacin>>3/30 Unasyn 3/30 >> 4/02 Rocephin 4/11 > 4/12 Meropenem 4/12>4/17   HPI: 55 yo F with hypertensive R thalamic/basal ganglia ICH with resultant hydrocephalus prompting external ventricular drain  placement on 2/10.  Subsequently on 2/10, she developed obtundation and respiratory arrest leading to intubation.  She has remained persistently obtunded, now has a trach and a PEG.  Hospital course complicated by persistent fever, Serratia pneumonia and now pre-orbital cellulitis  As of 3/25 with adjustments in hypertonicity medications patient began to improve regarding hypertonicity.  Eventually started on provigil with improvements in alertness.  Evaluated by rehab physician who recommended increasing provigil dose further.  Subsequently patient has become more alert.  Most days she is able to track and with encouragement able to follow simple commands especially since partial obstruction glasses placed.  See progress note dictated 4/27 for expanded details.   2/09 Admit for [place] 2/10 EVD placed, later intubated due to obtundation and respiratory arrest 2/11 MRI Brain, hypertonic saline d/c'd, EVD not working.  Husband did not consent to PICC  2/13 EVD removed 2/17 [place] discussion with family, PMT 2/24 Bedside trach and PEG 3/1 Transferred to [facility]  Subjective: Alert.  Right hand over knee but patient denies pain.  Is able to convey that she would like a pillow between her knees when asked.  Objective: Vitals:   02/04/21 0350 02/04/21 6578  BP: (!) 148/86 (!) 152/95  Pulse: 94   Resp: (!) 22   Temp: 97.9 F (36.6 C) 99.1 F (37.3 C)  SpO2: 100%    No intake or output data in the 24 hours ending 02/04/21 0813    Filed Weights   01/26/21 0500 01/28/21 0500 02/02/21 0500  Weight: 63.8 kg 62.5 kg 60.3 kg    Exam:       Gen: Alert and in no acute distress Respiratory: #4.0 cuffless trach, trach collar with FiO2 21% at 5L, lung sounds are clear.  Patient self decannulated briefly this morning and was able to maintain saturations greater than 95%.  RT replaced trach and nursing staff reported good cough response with replacement of trach. Cardiovascular: S1-S2, bedside telemetry  unremarkable regarding ST segment changes, normotensive, no peripheral edema. Abdomen:  PEG clamped except for installation of meds and free water; LBM/19.  Variable oral intake documented. Neurological: Less left facial droop detected.  Appears to have hearing loss.  Moves right upper extremity purposefully with strength 4/5.  Inconsistent with following commands.  RLE strength unable to be assessed but is noted to move purposely.  Has left-sided neglect. Psychiatric: Awake.  Tracks with eyes and attempts to phonate.  Able to answer some simple questions as long as she speak directly into her right ear secondary to presumed hearing loss.  Assessment/Plan: Acute problems: Multiple acute strokes  Right Thalamus ICH with IVH  Obstructive Hydrocephalus/left-sided neglect /Non TBI related hypertonicity -Continue baclofen and tizanidine for spasticity.  -Continue Provigil and rehabilitative therapies -Reevaluated by CIR (5/2 ) with recommendation for SNF -Continues to work well and consistently with therapies.   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 Carlis Abbott has had several discussions with patient's husband outlining current limitations for decannulation and uncertainty regarding this would even be possible in the future -At this time is not a candidate for decannulation -7/21 patient self decannulated and was able to maintain saturations greater than 95%.  RN reported excellent cough replacement of trach.  RT to suction patient to determine cough response-new orders placed.  ?  ST segment elevation -Patient has had some consistent ST abnormalities on telemetry since admission; unable to adequately communicate with her having chest pain -EKG obtained overnight unremarkable as have been troponins -We will check echocardiogram (last completed February 2022)  Recurrent right knee pain/known  ligamentous injury and chronic osteoarthritis -s/p injection of Marcaine and Depomedrol 6/30 -Continue scheduled Celebrex, Tylenol and Ultram  Hypertensive emergency/Malignant HTN -Continue Norvasc, beta-blocker and hydralazine.  Dysphagia -Continue dysphagia 1 with thin liquids -Continue bolus feeds between meals with 1 can of Ensure TID -Initially had excellent oral intake but appears to prefer Ensure beverages over pured diet.  Have sent message to SLP to see if repeat swallow evaluation can be completed in the event she is ready to advance to D2 diet  History of urinary retention -Continue Cardura    Other problems: Right upper extremity thrombophlebitis - [name] -> age indeterminate SVT of right cephalic vein  Neck Contracture/torticollis -- Resolved  ESBL positive Enterobacter UTI -Has completed 5 days of meropenem -Contact isolation during hospitalization -continue Oxybutynin suspected bladder spasms  Klebsiella UTI -Resistant to nitrofurantoin and ampicillin -Meropenem transitioned to Rocephin on 5/22 -To minimize risk of recurrent UTI have asked that pure wick only be used at bedtime on a 10 PM to 8 AM schedule  Recurrent diarrhea -Resolved -Likely combination of recent  antibiotics as well as scheduled laxative -MiraLAX now as needed  Bacterial Conjunctivitis /periorbital cellulitis -Resolved  Chronic pansinusitis/recurrent fevers 2/2 aspiration PNA;History of Serratia Pneumonia  Hospital Acquired Pneumonia:  -Completed several courses of antibiotic -Follow-up imaging including sinus CT consistent with chronic but stable and resolving sinusitis   Anemia -Stable  HLD -Continue Lipitor      Data Reviewed: Basic Metabolic Panel: Recent Labs  Lab 02/01/21 0146  NA 139  K 3.3*  CL 102  CO2 30  GLUCOSE 98  BUN 15  CREATININE 0.65  CALCIUM 9.7    Liver Function Tests: Recent Labs  Lab 02/01/21 0146  AST 19  ALT 24  ALKPHOS 76  BILITOT 0.6   PROT 7.0  ALBUMIN 3.5     CBC: Recent Labs  Lab 02/01/21 0146  WBC 5.5  HGB 10.2*  HCT 31.8*  MCV 77.9*  PLT 301     CBG: Recent Labs  Lab 02/01/21 1940 02/01/21 2345 02/02/21 0329 02/02/21 0818 02/02/21 1225  GLUCAP 113* 95 100* 155* 116*     Studies: No results found.   Scheduled Meds:  acetaminophen  650 mg Per Tube Q6H   amLODipine  10 mg Per Tube Daily   atorvastatin  40 mg Per Tube Daily   baclofen  10 mg Per Tube 3 times per day   carvedilol  25 mg Per Tube BID WC   celecoxib  200 mg Per Tube BID   chlorhexidine gluconate (MEDLINE KIT)  15 mL Mouth Rinse BID   Chlorhexidine Gluconate Cloth  6 each Topical Daily   diclofenac Sodium  4 g Topical QID   doxazosin  2 mg Per Tube Daily   feeding supplement  237 mL Oral TID BM   feeding supplement (PROSource TF)  45 mL Per Tube Daily   fiber  1 packet Per Tube BID   free water  200 mL Per Tube Q4H   hydrALAZINE  37.5 mg Per Tube Q8H   magic mouthwash w/lidocaine  10 mL Oral QID   mouth rinse  15 mL Mouth Rinse 5 X Daily   modafinil  200 mg Per Tube Daily   multivitamin with minerals  1 tablet Oral Daily   pantoprazole sodium  40 mg Per Tube Daily   tiZANidine  4 mg Per Tube QHS   Continuous Infusions:  sodium chloride Stopped (10/26/20 1445)    Principal Problem:   ICH (intracerebral hemorrhage) ([place]) Active Problems:   Acute hypoxemic respiratory failure (HCC)   Tracheostomy dependent ([place])   Palliative care by specialist   Muscle hypertonicity   Oropharyngeal dysphagia   Poor prognosis   Aspiration pneumonia of right lower lobe ([place])   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  Palliative care.   PCCM continues to follow.  No decannulation.   Procedures: Tracheostomy PEG  tube EEG Echocardiogram  Antibiotics: Vancomycin 2/12-2/15 Maxipime 2/12-2/15 Ancef 2/19-2/27 Vancomycin 2/20 x 1 dose Maxipime 2/27- 3/5 Vancomycin 3/1-3/3 Ancef 3/6-3/14 Cipro 3/20 4-3/30 Unasyn 3/30-4/4 Rocephin 4/11-4/12 Meropenem 4/12-4/17   Time spent: 15 minutes    Erin Hearing ANP  [facility] 7 am - 330 pm/M-F for direct patient care and secure chat Please refer to [place] for contact info 161  Days

## 2021-02-04 NOTE — Progress Notes (Signed)
Physical Therapy Treatment Patient Details Name: Charlene [name] MRN: [id number] DOB: February 26, 1966 Today's Date: 02/04/2021    History of Present Illness Pt is 55 y/o female [facility] to [facility]26[facility]20 with L sided weakness and headaches. 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 s/p Right frontal ventricular catheter placement on 2/10, stopped functioning on 2/11.  MRI 2/11 shows right greater than left mesial temporal lobes / parahippocampal cortex, bilateral basal ganglia, and possibly the upper pons that are concerning for acute infarcts, most likely secondary to mass effect from the hemorrhage. ETT 2/10-2/24.  Pt with trach and PEG on 09/10/20. Hospital course complicated by persistent fever, Serratia pneumonia and now pre-orbital cellulitis.   Pt self decannulated 02/04/21, trach re-inserted by RT.  PMH includes anxiety, HTN.    PT Comments    Pt is more alert again this session.  NP's note indicates she is on a medication to help with arousal/alertness and I think this is helping with her awareness, increased interaction in her environment.  She remains max assist for all mobility including lift OOB to WC with maxi move.  Her bil knee flexion is about 40 degrees bil at their straightest point and is a barrier to tilting in the bed or standing.  She continues to make slow, difficult to measure progress, today with support in sitting/sitting balance, so PT will continue to follow acutely at 1xs/wk to ensure continued therapy especially given her newly found arousal/alertness. I will speak to family about this change next session.  Goal update is also due next session.   Follow Up Recommendations  SNF     Equipment Recommendations  Wheelchair (measurements PT);Wheelchair cushion (measurements PT);Hospital bed;Other (comment) (tilt in space WC, hospital bed with  air overlay, hoyer lift)    Recommendations for Other Services       Precautions / Restrictions Precautions Precautions: Fall Precaution Comments: peg and trach    Mobility  Bed Mobility Overal bed mobility: Needs Assistance Bed Mobility: Rolling;Sidelying to Sit;Sit to Sidelying Rolling: Max assist Sidelying to sit: Max assist Supine to sit: Max assist     General bed mobility comments: Max assist for bed mobility and transfers, pt attempting to pull once hand situated to reach.    Transfers Overall transfer level: Needs assistance               General transfer comment: Maxi move used for OOB mobility to WC.  Ambulation/Gait                 Stairs             Wheelchair Mobility    Modified Rankin (Stroke Patients Only)       Balance Overall balance assessment: Needs assistance Sitting-balance support: Feet unsupported;Single extremity supported Sitting balance-Leahy Scale: Poor Sitting balance - Comments: needs min to mod assist once EOB and positioned over COG, still unable to maintain sitting without support when support removed.  Worked in sitting on trunk rotation bil, seated perturbations to elicit a balance reaction and attempting to decrease trunk support for pt to work on more independent sitting. Postural control: Posterior lean;Left lateral lean                                  Cognition Arousal/Alertness: Awake/alert Behavior During Therapy: Flat  affect Overall Cognitive Status: Impaired/Different from baseline Area of Impairment: Attention;Following commands;Awareness;Problem solving                   Current Attention Level: Sustained Memory: Decreased short-term memory Following Commands: Follows one step commands with increased time;Follows one step commands inconsistently Safety/Judgement: Decreased awareness of deficits Awareness: Intellectual Problem Solving: Slow processing;Decreased  initiation;Difficulty sequencing;Requires verbal cues;Requires tactile cues General Comments: More alert this week, is getting a medication to help with alertness      Exercises Other Exercises Other Exercises: PROM to L UE hand, elbow shoulder, bil knee extension, hip ER/abduction, trunk rotation with bil knee flexion.  Left knee seemed more painful and guarded today.  Able to get to ~40 degrees of knee flexion at best (for extension PROM)    General Comments        Pertinent Vitals/Pain Faces Pain Scale: Hurts even more Pain Location: bil knees L>R today, L UE with ROM Pain Descriptors / Indicators: Grimacing;Guarding Pain Intervention(s): Limited activity within patient's tolerance;Monitored during session;Repositioned    Home Living                      Prior Function            PT Goals (current goals can now be found in the care plan section) Progress towards PT goals: Progressing toward goals    Frequency    Min 1X/week      PT Plan Frequency needs to be updated    Co-evaluation              AM-PAC PT "6 Clicks" Mobility   Outcome Measure  Help needed turning from your back to your side while in a flat bed without using bedrails?: Total Help needed moving from lying on your back to sitting on the side of a flat bed without using bedrails?: Total Help needed moving to and from a bed to a chair (including a wheelchair)?: Total Help needed standing up from a chair using your arms (e.g., wheelchair or bedside chair)?: Total Help needed to walk in hospital room?: Total Help needed climbing 3-5 steps with a railing? : Total 6 Click Score: 6    End of Session   Activity Tolerance: Patient limited by pain Patient left: in chair;with family/visitor present (Husband, [name] in room) Nurse Communication: Mobility status;Need for lift equipment;Other (comment) PT Visit Diagnosis: Hemiplegia and hemiparesis;Other symptoms and signs involving the nervous  system (R29.898);Other abnormalities of gait and mobility (R26.89) Hemiplegia - Right/Left: Right (bil LE, L UE) Hemiplegia - dominant/non-dominant: Dominant Hemiplegia - caused by: Cerebral infarction;Nontraumatic intracerebral hemorrhage;Other Nontraumatic intracranial hemorrhage     Time: 5638-9373 PT Time Calculation (min) (ACUTE ONLY): 70 min  Charges:  $Therapeutic Exercise: 23-37 mins $Therapeutic Activity: 23-37 mins $Neuromuscular Re-education: 8-22 mins                     Corinna Capra, PT, DPT  Acute Rehabilitation Ortho Tech Supervisor [phone] pager [phone]) [phone] office

## 2021-02-04 NOTE — Plan of Care (Signed)
  Problem: Coping: Goal: Will identify appropriate support needs Outcome: Progressing   Problem: Health Behavior/Discharge Planning: Goal: Ability to manage health-related needs will improve Outcome: Progressing   Problem: Self-Care: Goal: Verbalization of feelings and concerns over difficulty with self-care will improve Outcome: Progressing Goal: Ability to communicate needs accurately will improve Outcome: Progressing   Problem: Nutrition: Goal: Risk of aspiration will decrease Outcome: Progressing   Problem: Intracerebral Hemorrhage Tissue Perfusion: Goal: Complications of Intracerebral Hemorrhage will be minimized Outcome: Progressing   Problem: Education: Goal: Knowledge of General Education information will improve Description: Including pain rating scale, medication(s)/side effects and non-pharmacologic comfort measures Outcome: Progressing   Problem: Health Behavior/Discharge Planning: Goal: Ability to manage health-related needs will improve Outcome: Progressing   Problem: Clinical Measurements: Goal: Ability to maintain clinical measurements within normal limits will improve Outcome: Progressing Goal: Will remain free from infection Outcome: Progressing Goal: Diagnostic test results will improve Outcome: Progressing Goal: Respiratory complications will improve Outcome: Progressing Goal: Cardiovascular complication will be avoided Outcome: Progressing   Problem: Coping: Goal: Level of anxiety will decrease Outcome: Progressing   Problem: Elimination: Goal: Will not experience complications related to bowel motility Outcome: Progressing Goal: Will not experience complications related to urinary retention Outcome: Progressing   Problem: Pain Managment: Goal: General experience of comfort will improve Outcome: Progressing   Problem: Skin Integrity: Goal: Risk for impaired skin integrity will decrease Outcome: Progressing   Problem: Intracerebral  Hemorrhage Tissue Perfusion: Goal: Complications of Intracerebral Hemorrhage will be minimized Outcome: Progressing   Problem: Ischemic Stroke/TIA Tissue Perfusion: Goal: Complications of ischemic stroke/TIA will be minimized Outcome: Progressing   

## 2021-02-04 NOTE — Progress Notes (Signed)
  Echocardiogram 2D Echocardiogram has been performed.  Delcie Roch 02/04/2021, 4:52 PM

## 2021-02-05 LAB — GLUCOSE, CAPILLARY
Glucose-Capillary: 125 mg/dL — ABNORMAL HIGH (ref 70–99)
Glucose-Capillary: 129 mg/dL — ABNORMAL HIGH (ref 70–99)
Glucose-Capillary: 161 mg/dL — ABNORMAL HIGH (ref 70–99)
Glucose-Capillary: 167 mg/dL — ABNORMAL HIGH (ref 70–99)

## 2021-02-05 NOTE — Progress Notes (Addendum)
[facility] PROGRESS NOTE  Charlene [name] JFH:[id number] DOB: 21-Jan-1966 DOA: 08/26/2020 PCP: Patient, [name] (Inactive)    October 08, 2020     10/16/2020                        10/16/2020     10/18/2020                        10/20/2020      12/30/2020                   12/30/2020   01/04/2021   Status: Remains inpatient appropriate because:Altered mental status and Unsafe d/c plan: Requirement to discharge to trach capable SNF   Dispo: The patient is from: Home              Anticipated d/c is to: SNF trach capable-[place]-we have recently discovered that the [facility] may have a trach bed available in clinicals have been sent to the facility for review -trach team re eval 6/3 and still rec NOT to decannulate 2/2 and lack of appropriate voluntary and involuntary cough response              Patient currently is medically stable to d/c.   Difficult to place patient Yes   Level of care: Telemetry Medical  Code Status: Full Family Communication: husband Charlene [name] [phone]) is her POA and legal contact.  [name] at bedside on 54/21.  He requested a list of local facilities trachs/the ones we have previously faxed patient information out to. DVT prophylaxis: Chronically bedbound.  5/6: We will discontinue prophylactic Lovenox   Vaccination status: Unvaccinated-husband still undecided.  Micro Data:  2/9 COVID + Influenza negative 2/10 MRSA PCR negative 2/12 BCx 2 > no growth  2/19 BC 1/2 +Stap EPI, 1/2 no growth  10/07/2020 sputum positive for Serratia marcescens 4/8 Enterobacter cloaca + urine culture MDR sensitive only to imipenem and gentamicin   Antimicrobials:  Cefepime 2/12 > 2/15 Vanc 2/12 > 2/15 10/08/2020 ciprofloxacin>>3/30 Unasyn 3/30 >> 4/02 Rocephin 4/11 > 4/12 Meropenem 4/12>4/17   HPI: 55 yo F with hypertensive R thalamic/basal ganglia ICH with resultant hydrocephalus prompting external ventricular drain  placement on 2/10.  Subsequently on 2/10, she developed obtundation and respiratory arrest leading to intubation.  She has remained persistently obtunded, now has a trach and a PEG.  Hospital course complicated by persistent fever, Serratia pneumonia and now pre-orbital cellulitis  As of 3/25 with adjustments in hypertonicity medications patient began to improve regarding hypertonicity.  Eventually started on provigil with improvements in alertness.  Evaluated by rehab physician who recommended increasing provigil dose further.  Subsequently patient has become more alert.  Most days she is able to track and with encouragement able to follow simple commands especially since partial obstruction glasses placed.  See progress note dictated 4/27 for expanded details.   2/09 Admit for [place] 54/10 EVD placed, later intubated due to obtundation and respiratory arrest 2/11 MRI Brain, hypertonic saline d/c'd, EVD not working.  Husband did not consent to PICC  2/13 EVD removed 2/17 [place] discussion with family, PMT 2/24 Bedside trach and PEG 3/1 Transferred to [facility]  Subjective: Sleeping soundly so I did not awaken this morning.  Objective: Vitals:   02/05/21 0416 02/05/21 0710  BP: (!) 152/92 (!) 142/95  Pulse: 87 94  Resp: 15   Temp: 98.9 F (  37.2 C) 98.9 F (37.2 C)  SpO2: 99%     Intake/Output Summary (Last 24 hours) at 02/05/2021 0747 Last data filed at 02/05/2021 4496 Gross per 24 hour  Intake --  Output 1100 ml  Net -1100 ml      Filed Weights   01/26/21 0500 01/28/21 0500 02/02/21 0500  Weight: 63.8 kg 62.5 kg 60.3 kg    Exam:       Gen: No acute distress, sleeping soundly. Respiratory: #4.0 cuffless trach, trach collar with FiO2 21% at 5L, lung sounds remain clear to auscultation, no increased work of Cardiovascular: S1-S2, normotensive, no resting tachycardia, no peripheral edema. Abdomen:  PEG in place for meds and nocturnal tube feedings.  Normoactive bowel sounds.   Nondistended.  LBM 7/19 has had inconsistent oral intake Neurological: Less left facial droop resolved.  Appears to have hearing loss.  Moves right upper extremity purposefully strength 4/5. RLE strength unable to be assessed, noted to move purposely.  Has left-side neglect. Psychiatric: Sleeping today.  Assessment/Plan: Acute problems: Multiple acute strokes  Right Thalamus ICH with IVH  Obstructive Hydrocephalus/left-sided neglect /Non TBI related hypertonicity -Continue baclofen and tizanidine for spasticity.  -Continue Provigil and rehabilitative therapies -Reevaluated by CIR (5/2 ) with recommendation for SNF -Continues to work well and consistently with therapies.   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 Carlis Abbott has had several discussions with patient's husband outlining current limitations for decannulation and uncertainty regarding this would even be possible in the future -At this time is not a candidate for decannulation -7/21 patient self decannulated and was able to maintain saturations greater than 95%.  RN reported excellent cough w/replacement of trach.   -RT to suction patient to determine cough response-new orders placed.  ?  ST segment elevation -Patient has had some consistent ST abnormalities on telemetry since admission; unable to adequately communicate with her having chest pain -EKG obtained overnight unremarkable as have been troponins -Echocardiogram repeated on 7/21 with normal parameters  Recurrent right knee pain/known ligamentous injury and chronic osteoarthritis -s/p injection of Marcaine and Depomedrol 6/30 -Continue scheduled Celebrex, Tylenol and Ultram  Hypertensive emergency/Malignant HTN -Continue Norvasc, beta-blocker and hydralazine.  Dysphagia -Continue dysphagia 1 with thin liquids -Continue bolus feeds between meals with 1 can of Ensure  TID -Due to significant decrease in oral intake on D1 diet alone nutrition has recommended resume tube feedings but on a nocturnal schedule-this was resumed on 7/21 -LBM 7/19 in context of inconsistent oral intake -if after several days on nocturnal tube feedings no improvement in bowel movements will likely need extra dose of laxative  History of urinary retention -Continue Cardura    Other problems: Right upper extremity thrombophlebitis - [name] -> age indeterminate SVT of right cephalic vein  Neck Contracture/torticollis -- Resolved  ESBL positive Enterobacter UTI -Has completed 5 days of meropenem -Contact isolation during hospitalization -continue Oxybutynin suspected bladder spasms  Klebsiella UTI -Resistant to nitrofurantoin and ampicillin -Meropenem transitioned to Rocephin on 5/22 -To minimize risk of recurrent UTI have asked that pure wick only be used at bedtime on a 10 PM to 8 AM schedule  Recurrent diarrhea -Resolved -Likely combination of recent antibiotics as well as scheduled laxative -MiraLAX now as needed  Bacterial Conjunctivitis /periorbital cellulitis -Resolved  Chronic pansinusitis/recurrent fevers 2/2 aspiration PNA;History of Serratia Pneumonia  Hospital Acquired Pneumonia:  -Completed several courses of antibiotic -Follow-up imaging including sinus CT consistent with chronic but  stable and resolving sinusitis   Anemia -Stable  HLD -Continue Lipitor      Data Reviewed: Basic Metabolic Panel: Recent Labs  Lab 02/01/21 0146 02/04/21 0008  NA 139  --   K 3.3*  --   CL 102  --   CO2 30  --   GLUCOSE 98  --   BUN 15  --   CREATININE 0.65  --   CALCIUM 9.7  --   PHOS  --  3.6    Liver Function Tests: Recent Labs  Lab 02/01/21 0146  AST 19  ALT 24  ALKPHOS 76  BILITOT 0.6  PROT 7.0  ALBUMIN 3.5     CBC: Recent Labs  Lab 02/01/21 0146  WBC 5.5  HGB 10.2*  HCT 31.8*  MCV 77.9*  PLT 301     CBG: Recent Labs  Lab  02/02/21 0329 02/02/21 0818 02/02/21 1225 02/04/21 2009 02/05/21 0000  GLUCAP 100* 155* 116* 125* 167*     Studies: ECHOCARDIOGRAM COMPLETE  Result Date: 02/04/2021    ECHOCARDIOGRAM REPORT   Patient Name:   JARIANA SHUMARD Date of Exam: 02/04/2021 Medical Rec #:  [id number]        Height:       65.0 in Accession #:    [id number]       Weight:       132.9 lb Date of Birth:  Mar 04, 1966        BSA:          1.663 m Patient Age:    78 years         BP:           142/96 mmHg Patient Gender: F                HR:           82 bpm. Exam Location:  Inpatient Procedure: 2D Echo Indications:    abnormal ecg  History:        Patient has prior history of Echocardiogram examinations, most                 recent 08/27/2020.  Sonographer:    Johny Chess Referring Phys: [id number] Duante Arocho L Haru Anspaugh  Sonographer Comments: Echo performed with patient supine and on artificial respirator. Image acquisition challenging due to uncooperative patient and restricted left arm. IMPRESSIONS  1. Left ventricular ejection fraction, by estimation, is 60 to 65%. The left ventricle has normal function. The left ventricle has no regional wall motion abnormalities. Left ventricular diastolic parameters were normal.  2. Right ventricular systolic function is normal. The right ventricular size is normal. There is normal pulmonary artery systolic pressure.  3. The mitral valve is normal in structure. No evidence of mitral valve regurgitation. No evidence of mitral stenosis.  4. The aortic valve is tricuspid. Aortic valve regurgitation is not visualized. No aortic stenosis is present. Comparison(s): A prior study was performed on 08/27/20. No significant change from prior study. Conclusion(s)/Recommendation(s): No intracardiac source of embolism detected on this transthoracic study. A transesophageal echocardiogram is recommended to exclude cardiac source of embolism if clinically indicated. FINDINGS  Left Ventricle: Left ventricular ejection  fraction, by estimation, is 60 to 65%. The left ventricle has normal function. The left ventricle has no regional wall motion abnormalities. The left ventricular internal cavity size was normal in size. There is  no left ventricular hypertrophy. Left ventricular diastolic parameters were normal. Right Ventricle: The right ventricular size is normal. No increase  in right ventricular wall thickness. Right ventricular systolic function is normal. There is normal pulmonary artery systolic pressure. The tricuspid regurgitant velocity is 2.65 m/s, and  with an assumed right atrial pressure of 3 mmHg, the estimated right ventricular systolic pressure is 78.6 mmHg. Left Atrium: Left atrial size was normal in size. Right Atrium: Right atrial size was normal in size. Pericardium: There is no evidence of pericardial effusion. Mitral Valve: The mitral valve is normal in structure. No evidence of mitral valve regurgitation. No evidence of mitral valve stenosis. Tricuspid Valve: The tricuspid valve is normal in structure. Tricuspid valve regurgitation is mild. Aortic Valve: The aortic valve is tricuspid. Aortic valve regurgitation is not visualized. No aortic stenosis is present. Pulmonic Valve: The pulmonic valve was normal in structure. Pulmonic valve regurgitation is trivial. No evidence of pulmonic stenosis. Aorta: The aortic root and ascending aorta are structurally normal, with no evidence of dilitation. IAS/Shunts: The atrial septum is grossly normal.  LEFT VENTRICLE PLAX 2D LVIDd:         4.10 cm  Diastology LVIDs:         2.30 cm  LV e' medial:    8.59 cm/s LV PW:         1.10 cm  LV E/e' medial:  8.4 LV IVS:        0.80 cm  LV e' lateral:   11.50 cm/s LVOT diam:     1.80 cm  LV E/e' lateral: 6.3 LV SV:         46 LV SV Index:   27 LVOT Area:     2.54 cm  RIGHT VENTRICLE             IVC RV S prime:     12.60 cm/s  IVC diam: 1.20 cm TAPSE (M-mode): 1.9 cm LEFT ATRIUM             Index       RIGHT ATRIUM           Index  LA diam:        3.00 cm 1.80 cm/m  RA Area:     11.40 cm LA Vol (A2C):   36.8 ml 22.13 ml/m RA Volume:   22.80 ml  13.71 ml/m LA Vol (A4C):   27.2 ml 16.36 ml/m LA Biplane Vol: 33.2 ml 19.97 ml/m  AORTIC VALVE LVOT Vmax:   95.50 cm/s LVOT Vmean:  64.700 cm/s LVOT VTI:    0.179 m  AORTA Ao Root diam: 2.80 cm Ao Asc diam:  2.60 cm MITRAL VALVE               TRICUSPID VALVE MV Area (PHT): 4.06 cm    TR Peak grad:   28.1 mmHg MV Decel Time: 187 msec    TR Vmax:        265.00 cm/s MV E velocity: 72.00 cm/s MV A velocity: 69.00 cm/s  SHUNTS MV E/A ratio:  1.04        Systemic VTI:  0.18 m                            Systemic Diam: 1.80 cm Rudean Haskell MD Electronically signed by Rudean Haskell MD Signature Date/Time: 02/04/2021/6:38:37 PM    Final      Scheduled Meds:  acetaminophen  650 mg Per Tube Q6H   amLODipine  10 mg Per Tube Daily   atorvastatin  40 mg Per Tube Daily  baclofen  10 mg Per Tube 3 times per day   carvedilol  25 mg Per Tube BID WC   celecoxib  200 mg Per Tube BID   chlorhexidine gluconate (MEDLINE KIT)  15 mL Mouth Rinse BID   Chlorhexidine Gluconate Cloth  6 each Topical Daily   diclofenac Sodium  4 g Topical QID   doxazosin  2 mg Per Tube Daily   feeding supplement  237 mL Oral BID BM   feeding supplement (JEVITY 1.5 CAL/FIBER)  900 mL Per Tube Q24H   fiber  1 packet Per Tube BID   free water  200 mL Per Tube Q4H   hydrALAZINE  37.5 mg Per Tube Q8H   mouth rinse  15 mL Mouth Rinse 5 X Daily   modafinil  200 mg Per Tube Daily   multivitamin with minerals  1 tablet Oral Daily   pantoprazole sodium  40 mg Per Tube Daily   tiZANidine  4 mg Per Tube QHS   Continuous Infusions:  sodium chloride Stopped (10/26/20 1445)    Principal Problem:   ICH (intracerebral hemorrhage) ([place]) Active Problems:   Acute hypoxemic respiratory failure (HCC)   Tracheostomy dependent ([place])   Palliative care by specialist   Muscle hypertonicity   Oropharyngeal dysphagia    Poor prognosis   Aspiration pneumonia of right lower lobe (HCC)   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  Palliative care.   PCCM continues to follow.  No decannulation.   Procedures: Tracheostomy PEG tube EEG Echocardiogram  Antibiotics: Vancomycin 2/12-2/15 Maxipime 2/12-2/15 Ancef 2/19-2/27 Vancomycin 2/20 x 1 dose Maxipime 2/27- 3/5 Vancomycin 3/1-3/3 Ancef 3/6-3/14 Cipro 3/20 4-3/30 Unasyn 3/30-4/4 Rocephin 4/11-4/12 Meropenem 4/12-4/17   Time spent: 15 minutes    Erin Hearing ANP  [facility] 7 am - 330 pm/M-F for direct patient care and secure chat Please refer to [place] for contact info 162  Days

## 2021-02-05 NOTE — Plan of Care (Signed)
  Problem: Coping: Goal: Will identify appropriate support needs Outcome: Progressing   Problem: Health Behavior/Discharge Planning: Goal: Ability to manage health-related needs will improve Outcome: Progressing   Problem: Self-Care: Goal: Verbalization of feelings and concerns over difficulty with self-care will improve Outcome: Progressing Goal: Ability to communicate needs accurately will improve Outcome: Progressing   Problem: Nutrition: Goal: Risk of aspiration will decrease Outcome: Progressing   Problem: Intracerebral Hemorrhage Tissue Perfusion: Goal: Complications of Intracerebral Hemorrhage will be minimized Outcome: Progressing   Problem: Education: Goal: Knowledge of General Education information will improve Description: Including pain rating scale, medication(s)/side effects and non-pharmacologic comfort measures Outcome: Progressing   Problem: Health Behavior/Discharge Planning: Goal: Ability to manage health-related needs will improve Outcome: Progressing   Problem: Clinical Measurements: Goal: Ability to maintain clinical measurements within normal limits will improve Outcome: Progressing Goal: Will remain free from infection Outcome: Progressing Goal: Diagnostic test results will improve Outcome: Progressing Goal: Respiratory complications will improve Outcome: Progressing Goal: Cardiovascular complication will be avoided Outcome: Progressing   Problem: Coping: Goal: Level of anxiety will decrease Outcome: Progressing   Problem: Elimination: Goal: Will not experience complications related to bowel motility Outcome: Progressing Goal: Will not experience complications related to urinary retention Outcome: Progressing   Problem: Pain Managment: Goal: General experience of comfort will improve Outcome: Progressing   Problem: Skin Integrity: Goal: Risk for impaired skin integrity will decrease Outcome: Progressing   Problem: Intracerebral  Hemorrhage Tissue Perfusion: Goal: Complications of Intracerebral Hemorrhage will be minimized Outcome: Progressing   Problem: Ischemic Stroke/TIA Tissue Perfusion: Goal: Complications of ischemic stroke/TIA will be minimized Outcome: Progressing   

## 2021-02-06 NOTE — Progress Notes (Signed)
PROGRESS NOTE    Charlene [name]  VOZ:[id number] DOB: 1965-10-09 DOA: 08/26/2020 PCP: Patient, [name] (Inactive)   Brief Narrative:  55 year old with hypertensive right thalamic/basal ganglia ICH with resultant hydronephrosis requiring VP drain placement 2/10 became obtunded postprocedure and respiratory arrest requiring intubation.  Eventually had trach and PEG placed.  Patient had long complicated hospital stay including Serratia pneumonia and periorbital cellulitis.   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  Supportive care.  Respiratory failure now trach dependent - Not a candidate for decannulation per pulmonary team.  Supportive care.  Abnormal telemetry, transient - Now resolved.  EKG is overall unremarkable.  Echo shows EF 60 to 65% on 02/04/2021.  Chest pain-free.  Troponins remain negative.  Dysphagia - Bolus tube feeds.  Dysphagia 1 diet.  Urinary retention - Cardura  Essential hypertension - Continue current meds  Anemia of chronic disease - Monitor hemoglobin  Hyperlipidemia - Statin   DVT prophylaxis: SCD's Start: 08/27/20 0026  Code Status: Full code Family Communication:    Status is: Inpatient Unsafe discharge planning  Dispo: The patient is from: Home              Anticipated d/c  is to: SNF              Patient currently is medically stable to d/c.   Difficult to place patient No       Nutritional status  Nutrition Problem: Inadequate oral intake Etiology: inability to eat  Signs/Symptoms: NPO status  Interventions: Tube feeding  Body mass index is 22.12 kg/m.           Subjective: No meaningful interaction, no complaints.  No acute events overnight.  Examination:  General exam: Appears calm and comfortable, generally ill-appearing.  Trach in place. Respiratory system: Clear to auscultation. Respiratory effort normal. Cardiovascular system: S1 & S2 heard, RRR. No JVD, murmurs, rubs, gallops or clicks. No pedal edema. Gastrointestinal system: Abdomen is nondistended, soft and nontender. No organomegaly or masses felt. Normal bowel sounds heard. Central nervous system: Difficult to fully assess.  Tracks me across the room.  Grossly moving all the extremities. Extremities: Symmetric 5 x 5 power. Skin: No rashes, lesions or ulcers Psychiatry: Poor judgment and insight.  Tracks me across the room    Objective: Vitals:   02/05/21 2045 02/05/21 2344 02/05/21 2354 02/06/21 0413  BP:   123/77   Pulse: 91 91 82 (!) 104  Resp: _0 Temp:   98.2 F (36.8 C)   TempSrc:   Oral   SpO2: 98% 98% 99% 99%  Weight:      Height:       No intake or output data in the 24 hours ending 02/06/21 0738 Filed Weights   01/26/21 0500 01/28/21 0500 02/02/21 0500  Weight: 63.8 kg 62.5 kg 60.3 kg     Data  Reviewed:   CBC: Recent Labs  Lab 02/01/21 0146  WBC 5.5  HGB 10.2*  HCT 31.8*  MCV 77.9*  PLT 604   Basic Metabolic Panel: Recent Labs  Lab 02/01/21 0146 02/04/21 0008  NA 139  --   K 3.3*  --   CL 102  --   CO2 30  --   GLUCOSE 98  --   BUN 15  --   CREATININE 0.65  --   CALCIUM 9.7  --   PHOS  --  3.6   GFR: Estimated Creatinine Clearance: 73.2 mL/min (by C-G formula based on SCr of 0.65 mg/dL). Liver Function  Tests: Recent Labs  Lab 02/01/21 0146  AST 19  ALT 24  ALKPHOS 76  BILITOT 0.6  PROT 7.0  ALBUMIN 3.5   No results for input(s): LIPASE, AMYLASE in the last 168 hours. No results for input(s): AMMONIA in the last 168 hours. Coagulation Profile: No results for input(s): INR, PROTIME in the last 168 hours. Cardiac Enzymes: No results for input(s): CKTOTAL, CKMB, CKMBINDEX, TROPONINI in the last 168 hours. BNP (last 3 results) No results for input(s): PROBNP in the last 8760 hours. HbA1C: No results for input(s): HGBA1C in the last 72 hours. CBG: Recent Labs  Lab 02/04/21 2009 02/05/21 0000 02/05/21 0433 02/05/21 2117 02/05/21 2353  GLUCAP 125* 167* 129* 125* 161*   Lipid Profile: No results for input(s): CHOL, HDL, LDLCALC, TRIG, CHOLHDL, LDLDIRECT in the last 72 hours. Thyroid Function Tests: No results for input(s): TSH, T4TOTAL, FREET4, T3FREE, THYROIDAB in the last 72 hours. Anemia Panel: No results for input(s): VITAMINB12, FOLATE, FERRITIN, TIBC, IRON, RETICCTPCT in the last 72 hours. Sepsis Labs: No results for input(s): PROCALCITON, LATICACIDVEN in the last 168 hours.  No results found for this or any previous visit (from the past 240 hour(s)).       Radiology Studies: ECHOCARDIOGRAM COMPLETE  Result Date: 02/04/2021    ECHOCARDIOGRAM REPORT   Patient Name:   Charlene [name] Date of Exam: 02/04/2021 Medical Rec #:  [id number]        Height:       65.0 in Accession #:    [id number]       Weight:       132.9 lb Date of Birth:  05-31-1966        BSA:          1.663 m Patient Age:    23 years         BP:           142/96 mmHg Patient Gender: F                HR:           82 bpm. Exam Location:  Inpatient Procedure: 2D Echo Indications:    abnormal ecg  History:        Patient has prior history of Echocardiogram examinations, most                 recent 08/27/2020.  Sonographer:    Johny Chess Referring Phys: [id number] ALLISON L ELLIS  Sonographer Comments: Echo  performed with patient supine and on artificial respirator. Image acquisition challenging due to uncooperative patient and restricted left arm. IMPRESSIONS  1. Left ventricular ejection fraction, by estimation, is 60 to 65%. The left ventricle has normal function. The left ventricle has no regional wall motion abnormalities. Left ventricular diastolic parameters were normal.  2. Right ventricular systolic function is normal.  The right ventricular size is normal. There is normal pulmonary artery systolic pressure.  3. The mitral valve is normal in structure. No evidence of mitral valve regurgitation. No evidence of mitral stenosis.  4. The aortic valve is tricuspid. Aortic valve regurgitation is not visualized. No aortic stenosis is present. Comparison(s): A prior study was performed on 08/27/20. No significant change from prior study. Conclusion(s)/Recommendation(s): No intracardiac source of embolism detected on this transthoracic study. A transesophageal echocardiogram is recommended to exclude cardiac source of embolism if clinically indicated. FINDINGS  Left Ventricle: Left ventricular ejection fraction, by estimation, is 60 to 65%. The left ventricle has normal function. The left ventricle has no regional wall motion abnormalities. The left ventricular internal cavity size was normal in size. There is  no left ventricular hypertrophy. Left ventricular diastolic parameters were normal. Right Ventricle: The right ventricular size is normal. No increase in right ventricular wall thickness. Right ventricular systolic function is normal. There is normal pulmonary artery systolic pressure. The tricuspid regurgitant velocity is 2.65 m/s, and  with an assumed right atrial pressure of 3 mmHg, the estimated right ventricular systolic pressure is 89.2 mmHg. Left Atrium: Left atrial size was normal in size. Right Atrium: Right atrial size was normal in size. Pericardium: There is no evidence of pericardial effusion. Mitral  Valve: The mitral valve is normal in structure. No evidence of mitral valve regurgitation. No evidence of mitral valve stenosis. Tricuspid Valve: The tricuspid valve is normal in structure. Tricuspid valve regurgitation is mild. Aortic Valve: The aortic valve is tricuspid. Aortic valve regurgitation is not visualized. No aortic stenosis is present. Pulmonic Valve: The pulmonic valve was normal in structure. Pulmonic valve regurgitation is trivial. No evidence of pulmonic stenosis. Aorta: The aortic root and ascending aorta are structurally normal, with no evidence of dilitation. IAS/Shunts: The atrial septum is grossly normal.  LEFT VENTRICLE PLAX 2D LVIDd:         4.10 cm  Diastology LVIDs:         2.30 cm  LV e' medial:    8.59 cm/s LV PW:         1.10 cm  LV E/e' medial:  8.4 LV IVS:        0.80 cm  LV e' lateral:   11.50 cm/s LVOT diam:     1.80 cm  LV E/e' lateral: 6.3 LV SV:         46 LV SV Index:   27 LVOT Area:     2.54 cm  RIGHT VENTRICLE             IVC RV S prime:     12.60 cm/s  IVC diam: 1.20 cm TAPSE (M-mode): 1.9 cm LEFT ATRIUM             Index       RIGHT ATRIUM           Index LA diam:        3.00 cm 1.80 cm/m  RA Area:     11.40 cm LA Vol (A2C):   36.8 ml 22.13 ml/m RA Volume:   22.80 ml  13.71 ml/m LA Vol (A4C):   27.2 ml 16.36 ml/m LA Biplane Vol: 33.2 ml 19.97 ml/m  AORTIC VALVE LVOT Vmax:   95.50 cm/s LVOT Vmean:  64.700 cm/s LVOT VTI:    0.179 m  AORTA Ao Root diam: 2.80 cm Ao Asc diam:  2.60 cm MITRAL VALVE  TRICUSPID VALVE MV Area (PHT): 4.06 cm    TR Peak grad:   28.1 mmHg MV Decel Time: 187 msec    TR Vmax:        265.00 cm/s MV E velocity: 72.00 cm/s MV A velocity: 69.00 cm/s  SHUNTS MV E/A ratio:  1.04        Systemic VTI:  0.18 m                            Systemic Diam: 1.80 cm Rudean Haskell MD Electronically signed by Rudean Haskell MD Signature Date/Time: 02/04/2021/6:38:37 PM    Final         Scheduled Meds:  acetaminophen  650 mg Per Tube  Q6H   amLODipine  10 mg Per Tube Daily   atorvastatin  40 mg Per Tube Daily   baclofen  10 mg Per Tube 3 times per day   carvedilol  25 mg Per Tube BID WC   celecoxib  200 mg Per Tube BID   chlorhexidine gluconate (MEDLINE KIT)  15 mL Mouth Rinse BID   Chlorhexidine Gluconate Cloth  6 each Topical Daily   diclofenac Sodium  4 g Topical QID   doxazosin  2 mg Per Tube Daily   feeding supplement  237 mL Oral BID BM   feeding supplement (JEVITY 1.5 CAL/FIBER)  900 mL Per Tube Q24H   fiber  1 packet Per Tube BID   free water  200 mL Per Tube Q4H   hydrALAZINE  37.5 mg Per Tube Q8H   mouth rinse  15 mL Mouth Rinse 5 X Daily   modafinil  200 mg Per Tube Daily   multivitamin with minerals  1 tablet Oral Daily   pantoprazole sodium  40 mg Per Tube Daily   tiZANidine  4 mg Per Tube QHS   Continuous Infusions:  sodium chloride Stopped (10/26/20 1445)     LOS: 163 days   Time spent= 35 mins    Batool Majid Arsenio Loader, MD [facility]  If 7PM-7AM, please contact night-coverage  02/06/2021, 7:38 AM

## 2021-02-07 NOTE — Progress Notes (Signed)
PROGRESS NOTE    Charlene [name]  LTR:[id number] DOB: 1966-04-26 DOA: 08/26/2020 PCP: Patient, [name] (Inactive)   Brief Narrative:  55 year old with hypertensive right thalamic/basal ganglia ICH with resultant hydronephrosis requiring VP drain placement 2/10 became obtunded postprocedure and respiratory arrest requiring intubation.  Eventually had trach and PEG placed.  Patient had long complicated hospital stay including Serratia pneumonia and periorbital cellulitis.   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  Supportive care.  Respiratory failure now trach dependent - Not a candidate for decannulation per pulmonary team.  Supportive care.  Abnormal telemetry, transient - Now resolved.  EKG is overall unremarkable.  Echo shows EF 60 to 65% on 02/04/2021.  Chest pain-free.  Troponins remain negative.  Dysphagia - Bolus tube feeds.  Dysphagia 1 diet. Encourage PO intake.   Urinary retention - Cardura  Essential hypertension - Continue current meds  Anemia of chronic disease - Monitor hemoglobin  Hyperlipidemia - Statin   DVT prophylaxis: SCD's Start: 08/27/20 0026  Code Status: Full code Family Communication:    Status is: Inpatient Unsafe discharge planning  Dispo: The patient is from: Home               Anticipated d/c is to: SNF              Patient currently is medically stable to d/c.   Difficult to place patient No       Nutritional status  Nutrition Problem: Inadequate oral intake Etiology: inability to eat  Signs/Symptoms: NPO status  Interventions: Tube feeding  Body mass index is 22.12 kg/m.           Subjective: Oral intake remains very poor.   Examination:  Constitutional: Not in acute distress, chronically ill appearing.  Respiratory: Clear to auscultation bilaterally Cardiovascular: Normal sinus rhythm, no rubs Abdomen: Nontender nondistended good bowel sounds Musculoskeletal: No edema noted Skin: No rashes seen Neurologic: difficult to assess, grossly moving ext.  Psychiatric: Difficult to asses.  Peg in place.    Objective: Vitals:   02/07/21 0418 02/07/21 0715 02/07/21 0758 02/07/21 1118  BP: (!) 145/86 (!) 148/92  132/86  Pulse: (!) 102 100 (!) 104 91  Resp: _0 Temp: 98.9 F (37.2 C) 98.6 F (37 C)    TempSrc: Oral Oral    SpO2: 99% 98% 99% 99%  Weight:      Height:        Intake/Output Summary (Last 24 hours) at 02/07/2021 1123 Last data filed at 02/07/2021 1016 Gross per 24 hour  Intake 30 ml  Output --  Net 30 ml   Filed Weights   01/26/21 0500 01/28/21 0500 02/02/21 0500  Weight: 63.8 kg 62.5 kg 60.3 kg     Data Reviewed:   CBC: Recent Labs  Lab 02/01/21 0146  WBC 5.5  HGB 10.2*  HCT 31.8*  MCV 77.9*  PLT 378   Basic Metabolic Panel: Recent Labs  Lab 02/01/21 0146 02/04/21 0008  NA 139  --   K 3.3*  --   CL 102  --   CO2 30  --   GLUCOSE 98  --   BUN 15  --   CREATININE 0.65  --   CALCIUM 9.7  --   PHOS  --  3.6   GFR: Estimated Creatinine Clearance: 73.2 mL/min (by C-G formula based on SCr of 0.65 mg/dL). Liver Function Tests: Recent Labs  Lab 02/01/21 0146  AST 19  ALT 24  ALKPHOS 76  BILITOT 0.6  PROT 7.0  ALBUMIN 3.5   No results for input(s): LIPASE, AMYLASE in the  last 168 hours. No results for input(s): AMMONIA in the last 168 hours. Coagulation Profile: No results for input(s): INR, PROTIME in the last 168 hours. Cardiac Enzymes: No results for input(s): CKTOTAL, CKMB, CKMBINDEX, TROPONINI in the last 168 hours. BNP (last 3 results) No results for input(s): PROBNP in the last 8760 hours. HbA1C: No results for input(s): HGBA1C in the last 72 hours. CBG: Recent Labs  Lab 02/04/21 2009 02/05/21 0000 02/05/21 0433 02/05/21 2117 02/05/21 2353  GLUCAP 125* 167* 129* 125* 161*   Lipid Profile: No results for input(s): CHOL, HDL, LDLCALC, TRIG, CHOLHDL, LDLDIRECT in the last 72 hours. Thyroid Function Tests: No results for input(s): TSH, T4TOTAL, FREET4, T3FREE, THYROIDAB in the last 72 hours. Anemia Panel: No results for input(s): VITAMINB12, FOLATE, FERRITIN, TIBC, IRON, RETICCTPCT in the last 72 hours. Sepsis Labs: No results for input(s): PROCALCITON, LATICACIDVEN in the last 168 hours.  No results found for this or any previous visit (from the past 240 hour(s)).       Radiology Studies: No results found.      Scheduled Meds:  acetaminophen  650 mg Per Tube Q6H   amLODipine  10 mg Per Tube Daily   atorvastatin  40 mg Per Tube Daily   baclofen  10 mg Per Tube 3 times per day   carvedilol  25 mg Per Tube BID WC   celecoxib  200 mg Per Tube BID   chlorhexidine gluconate (MEDLINE KIT)  15 mL Mouth Rinse BID   Chlorhexidine Gluconate Cloth  6 each Topical Daily   diclofenac Sodium  4 g Topical QID   doxazosin  2 mg Per Tube Daily   feeding supplement  237 mL Oral BID BM   feeding supplement (JEVITY 1.5 CAL/FIBER)  900 mL Per Tube Q24H   fiber  1 packet Per Tube BID   free water  200 mL Per Tube Q4H   hydrALAZINE  37.5 mg Per Tube Q8H   mouth rinse  15 mL Mouth Rinse 5 X Daily   modafinil  200 mg Per Tube Daily   multivitamin with minerals  1 tablet Oral Daily   pantoprazole sodium  40 mg Per Tube Daily   tiZANidine  4 mg  Per Tube QHS   Continuous Infusions:  sodium chloride Stopped (10/26/20 1445)     LOS: 164 days   Time spent= 35 mins    Tina Gruner Arsenio Loader, MD [facility]  If 7PM-7AM, please contact night-coverage  02/07/2021, 11:23 AM

## 2021-02-08 LAB — GLUCOSE, CAPILLARY
Glucose-Capillary: 152 mg/dL — ABNORMAL HIGH (ref 70–99)
Glucose-Capillary: 100 mg/dL — ABNORMAL HIGH (ref 70–99)
Glucose-Capillary: 111 mg/dL — ABNORMAL HIGH (ref 70–99)
Glucose-Capillary: 114 mg/dL — ABNORMAL HIGH (ref 70–99)
Glucose-Capillary: 114 mg/dL — ABNORMAL HIGH (ref 70–99)
Glucose-Capillary: 114 mg/dL — ABNORMAL HIGH (ref 70–99)
Glucose-Capillary: 117 mg/dL — ABNORMAL HIGH (ref 70–99)
Glucose-Capillary: 125 mg/dL — ABNORMAL HIGH (ref 70–99)
Glucose-Capillary: 130 mg/dL — ABNORMAL HIGH (ref 70–99)
Glucose-Capillary: 130 mg/dL — ABNORMAL HIGH (ref 70–99)
Glucose-Capillary: 135 mg/dL — ABNORMAL HIGH (ref 70–99)
Glucose-Capillary: 140 mg/dL — ABNORMAL HIGH (ref 70–99)
Glucose-Capillary: 146 mg/dL — ABNORMAL HIGH (ref 70–99)
Glucose-Capillary: 157 mg/dL — ABNORMAL HIGH (ref 70–99)
Glucose-Capillary: 94 mg/dL (ref 70–99)
Glucose-Capillary: 148 mg/dL — ABNORMAL HIGH (ref 70–99)
Glucose-Capillary: 91 mg/dL (ref 70–99)
Glucose-Capillary: 104 mg/dL — ABNORMAL HIGH (ref 70–99)
Glucose-Capillary: 116 mg/dL — ABNORMAL HIGH (ref 70–99)

## 2021-02-08 NOTE — Progress Notes (Signed)
PT Treatment Note: Goal Update completed.  Pt was fatigued today per mom, [name], likely due to not sleeping well last night.  We were able to do a partial tilt, however, are limited in good positioning by decreased bil knee extension and preference to have both knees rotated to the right when on her back.  We were able to get up to 45 degrees for ~5-8 mins.  We decided not to lift her OOB to the [facility] today as she seemed very tired and mom had plans to feed her and then get her comfortable for a nap.  We discussed change in frequency and need to get RN staff more involved in lifting her OOB to her WC.  PT will continue to follow acutely for safe mobility progression.    02/08/21 1422  PT Visit Information  Last PT Received On 02/08/21  Assistance Needed +2  History of Present Illness Pt is 55 y/o female presents to [facility]20 with L sided weakness and headaches. 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 s/p Right frontal ventricular catheter placement on 2/10, stopped functioning on 2/11.  MRI 2/11 shows right greater than left mesial temporal lobes / parahippocampal cortex, bilateral basal ganglia, and possibly the upper pons that are concerning for acute infarcts, most likely secondary to mass effect from the hemorrhage. ETT 2/10-2/24.  Pt with trach and PEG on 09/10/20. Hospital course complicated by persistent fever, Serratia pneumonia and now pre-orbital cellulitis.   Pt self decannulated 02/04/21, trach re-inserted by RT.  PMH includes anxiety, HTN.  Subjective Data  Patient Stated Goal Pt unable to state  Precautions  Precautions Fall  Precaution Comments peg and trach  Required Braces or Orthoses Other Brace  Other Brace bil. PRAFOs, soft R knee extension splint and soft L elbow extension splint  Pain Assessment  Pain Assessment Faces  Faces Pain Scale 4   Pain Location bil knees R >L today  Pain Descriptors / Indicators Grimacing;Guarding  Pain Intervention(s) Limited activity within patient's tolerance;Monitored during session;Repositioned  Cognition  Arousal/Alertness Lethargic  Behavior During Therapy Flat affect  Overall Cognitive Status Impaired/Different from baseline  Area of Impairment Orientation;Attention;Memory;Following commands;Safety/judgement;Awareness;Problem solving  Current Attention Level Sustained  Memory Decreased short-term memory  Following Commands Follows one step commands inconsistently;Follows one step commands with increased time  Safety/Judgement Decreased awareness of safety;Decreased awareness of deficits  Awareness Intellectual  Problem Solving Slow processing;Decreased initiation;Difficulty sequencing;Requires verbal cues;Requires tactile cues  General Comments Pt more lethargic today, Mom, [name] thinks she did not sleep well last night, so at end of session we stayed in bed so she could eat lunch and then rest.  Bed Mobility  Overal bed mobility Needs Assistance  Bed Mobility Rolling  Rolling Max assist  General bed mobility comments Max assist to roll bil for peri care.  Transfers  General transfer comment Decided not to lift OOB to WC due to pt being so fatigued today.  Tilt Bed  Patient on Tilt Bed? Yes  Angle 45 degrees  Total Minutes in Angle 10 minutes  Patient Response  (pt with increased WOB, however, O2 sats in the upper 90s on RA throughout.)  General Comments  General comments (skin integrity, edema, etc.) Pt continues to have bil knee flexion and rotation to her right side (R hip ER, L hip IR preference) which makes it along with bil knee  flexion to ~40 degress difficult to use the tilt function of the bed, but we tried today to see if we could reduce tone and encourage extension with some WB.  Pt tolerated it ok, but again, it will not be the best WB position.  Other Exercises  Other  Exercises bil knee extension ROM x10 mins prolonged progressive holds.  Did well with asking pt to straighten legs as much as she could before therapist provided manual over pressure.  PT - End of Session  Activity Tolerance Patient limited by fatigue  Patient left in bed;with call bell/phone within reach;with family/visitor present  Nurse Communication Mobility status;Need for lift equipment;Other (comment) (decreased PT freq with increased reliance on RN staff to lift her OOB to the [facility].)   PT - Assessment/Plan  PT Plan Current plan remains appropriate  PT Visit Diagnosis Hemiplegia and hemiparesis;Other symptoms and signs involving the nervous system (R29.898);Other abnormalities of gait and mobility (R26.89)  Hemiplegia - Right/Left Right (BIl LE, L UE)  Hemiplegia - dominant/non-dominant Dominant  Hemiplegia - caused by Cerebral infarction;Nontraumatic intracerebral hemorrhage;Other Nontraumatic intracranial hemorrhage  PT Frequency (ACUTE ONLY) Min 1X/week  Follow Up Recommendations SNF  PT equipment Wheelchair (measurements PT);Wheelchair cushion (measurements PT);Hospital bed;Other (comment) (18x18 tilt in space WC, hospital bed with air overlay, hoyer lift)  AM-PAC PT "6 Clicks" Mobility Outcome Measure (Version 2)  Help needed turning from your back to your side while in a flat bed without using bedrails? 1  Help needed moving from lying on your back to sitting on the side of a flat bed without using bedrails? 1  Help needed moving to and from a bed to a chair (including a wheelchair)? 1  Help needed standing up from a chair using your arms (e.g., wheelchair or bedside chair)? 1  Help needed to walk in hospital room? 1  Help needed climbing 3-5 steps with a railing?  1  6 Click Score 6  Consider Recommendation of Discharge To: CIR/SNF/LTACH  Progressive Mobility  What is the highest level of mobility based on the progressive mobility assessment? Level 1 (Bedfast) - Unable to  balance while sitting on edge of bed  Mobility Sit up in bed/chair position for meals  PT Goal Progression  Progress towards PT goals Progressing toward goals  Acute Rehab PT Goals  PT Goal Formulation With family  Time For Goal Achievement 02/22/21  Potential to Achieve Goals Fair  PT Time Calculation  PT Start Time (ACUTE ONLY) 1205  PT Stop Time (ACUTE ONLY) 1251  PT Time Calculation (min) (ACUTE ONLY) 46 min  PT General Charges  $$ ACUTE PT VISIT 1 Visit  PT Treatments  $Therapeutic Exercise 8-22 mins  $Therapeutic Activity 23-37 mins

## 2021-02-08 NOTE — Progress Notes (Signed)
  Speech Language Pathology Treatment: Cognitive-Linquistic  Patient Details Name: Charlene [name] MRN: [id number] DOB: 28-Dec-1965 Today's Date: 02/08/2021 Time: 0630-1601 SLP Time Calculation (min) (ACUTE ONLY): 19 min  Assessment / Plan / Recommendation Clinical Impression  Pt alert; her mother is at bedside.  She was demonstrating some backflow of air from trach upon PMV removal per her mother, and this was re-demonstrated during our session. After this clinician and NT repositioned [name] in more upright position, there were no further issues with backflow/upper airway access and  PMV was left in place.  Pt followed single step commands with verbal cues to initiate.  She approximated words with lip movement but did not generate any voice when engaging in naming or oral reading of single words (despite max cues).  SLP will continue to work with pt on speech/communication/swallowing.   HPI HPI: Pt is 55 y/o female presents to [facility] on 2/9 with L sided weakness and headaches. PMH includes anxiety, HTN. 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 S/p Right frontal ventricular catheter placement on 2/10, stopped functioning on 2/11.  MRI 2/11 shows right greater than left mesial temporal lobes / parahippocampal cortex, bilateral basal ganglia, and possibly the upper pons that are concerning for acute infarcts, most likely secondary to mass effect from the hemorrhage. ETT 2/10; trach/PEG 09/10/20.  Trach changed to #6 cuffless 3/11; #4 cuffless 5/4. Has had two MBS studies, the last on 7/8 - pt started on dysphagia 1, thin liquids. Trach replaced following self-decannulation 7/21.      SLP Plan  Continue with current plan of care       Recommendations         Patient may use Passy-Muir Speech Valve: During all waking hours (remove during sleep);During PO  intake/meals PMSV Supervision: Intermittent         Oral Care Recommendations: Oral care BID Follow up Recommendations: Skilled Nursing facility SLP Visit Diagnosis: Cognitive communication deficit (U93.235) Plan: Continue with current plan of care       GO              Charlene Skillern L. Samson [name], MA CCC/SLP Acute Rehabilitation Services Office number [phone] Pager [phone]   Charlene [name] 02/08/2021, 1:23 PM

## 2021-02-08 NOTE — Progress Notes (Signed)
   NAME:  Charlene [name], MRN:  [id number], DOB:  1965/11/08, LOS: 165 ADMISSION DATE:  08/26/2020, CONSULTATION DATE:  2/10 REFERRING MD:  Roda Shutters, CHIEF COMPLAINT:  headache   History of Present Illness:  55 year old female admitted with hypertensive R thalamic/basal ganglia ICH. Resultant obstructive hydrocephalus prompting EVD placement 2/10. Subsequently, 2/10 she had acute mental status change to obtundation and respiratory arrest resulting in intubation. Trach and Peg 2/24. Trach change to #6 cuffless on 3/11.   Pertinent  Medical History  Hypertension, Medication non-compliance  Significant Hospital Events: Including procedures, antibiotic start and stop dates in addition to other pertinent events   2/09 Admit for ICH 2/10 EVD placed, later intubated due to obtundation and respiratory arrest 2/11 MRI Brain, hypertonic saline d/c'd, EVD not working.  Husband did not consent to PICC  2/13 EVD removed 2/17 GOC discussion with family, PMT 2/24 tracheostomy placed by Dr. Denese Killings, PEG placed by Dr. Sheliah Hatch 3/11 trach changed to Shiley 6 uncuffed 3/21 sputum culture with Serratia resistant to Ancef 4/18 28% ATC / 5L  5/08 downsized trach to 4 7/11 No significant secretions / no issues with trach 7/21 Self de-cannulated, trach replaced 7/25 ATC 21%/5L. Using PSV, some speech per SLP, eating  Interim History / Subjective:  SLP reports some speech with soft voice with PMV in place, eating Mother at bedside  On 5L/21% Self decannulated 7/21 per mother   Objective   Blood pressure (!) 147/101, pulse 87, temperature 98.6 F (37 C), temperature source Oral, resp. rate 16, height 5\' 5"  (1.651 m), weight 63 kg, last menstrual period 03/03/2014, SpO2 99 %, unknown if currently breastfeeding.    FiO2 (%):  [21 %] 21 %   Intake/Output Summary (Last 24 hours) at 02/08/2021 1127 Last data filed at 02/08/2021 0400 Gross per 24 hour  Intake 20 ml  Output 300 ml  Net -280 ml   Filed Weights    01/28/21 0500 02/02/21 0500 02/08/21 0500  Weight: 62.5 kg 60.3 kg 63 kg    Examination: General: chronically ill appearing female lying in bed in NAD  HEENT: MM pink/moist, anicteric, trach midline c/d/i Neuro: eyes open, spontaneous movement of right arm noted, left arm with preventative soft brace in place, left sided neglect CV: s1s2 RRR, no m/r/g PULM: non-labored, #6 trach midline c/d/i GI: soft, bsx4 active  Extremities: warm/dry, contracture of left hand noted  Skin: no rashes or lesions  Resolved Hospital Problem list     Assessment & Plan:   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 -trach care per protocol  -not a candidate for decannulation due to deconditioning / bed bound status and poor cough mechanics  -ATC for moisture  -continue SLP efforts, PMV, diet with aspiration precautions + TF (poor intake) -PT / OT efforts  -follow intermittent CXR    PCCM will follow up again in week of 02/15/21 - call if assistance is needed sooner.   04/17/21, MSN, APRN, NP-C, AGACNP-BC [facility] 02/08/2021, 11:27 AM   Please see [email or web address] for pager details.   From 7A-7P if no response, please call [phone] After hours, please call ELink [phone]

## 2021-02-08 NOTE — Progress Notes (Signed)
[facility] PROGRESS NOTE  Charlene [name] PPI:[id number] DOB: Feb 25, 1966 DOA: 08/26/2020 PCP: Patient, [name] (Inactive)    October 08, 2020     10/16/2020                        10/16/2020     10/18/2020                        10/20/2020      12/30/2020                   12/30/2020   01/04/2021   Status: Remains inpatient appropriate because:Altered mental status and Unsafe d/c plan: Requirement to discharge to trach capable SNF   Dispo: The patient is from: Home              Anticipated d/c is to: SNF trach capable-[place]-we have recently discovered that the [facility] may have a trach bed available in clinicals have been sent to the facility for review -trach team re eval 6/3 and still rec NOT to decannulate 2/2 and lack of appropriate voluntary and involuntary cough response              Patient currently is medically stable to d/c.   Difficult to place patient Yes   Level of care: Telemetry Medical  Code Status: Full Family Communication: husband Charlene [name] [phone]) is her POA and legal contact.  [name] at bedside on 7/21.  He requested a list of local facilities trachs/the ones we have previously faxed patient information out to. DVT prophylaxis: Chronically bedbound.  5/6: We will discontinue prophylactic Lovenox   Vaccination status: Unvaccinated-husband still undecided.  Micro Data:  2/9 COVID + Influenza negative 2/10 MRSA PCR negative 2/12 BCx 2 > no growth  2/19 BC 1/2 +Stap EPI, 1/2 no growth  10/07/2020 sputum positive for Serratia marcescens 4/8 Enterobacter cloaca + urine culture MDR sensitive only to imipenem and gentamicin   Antimicrobials:  Cefepime 2/12 > 2/15 Vanc 2/12 > 2/15 10/08/2020 ciprofloxacin>>3/30 Unasyn 3/30 >> 4/02 Rocephin 4/11 > 4/12 Meropenem 4/12>4/17   HPI: 55 yo F with hypertensive R thalamic/basal ganglia ICH with resultant hydrocephalus prompting external ventricular drain  placement on 2/10.  Subsequently on 2/10, she developed obtundation and respiratory arrest leading to intubation.  She has remained persistently obtunded, now has a trach and a PEG.  Hospital course complicated by persistent fever, Serratia pneumonia and now pre-orbital cellulitis  As of 3/25 with adjustments in hypertonicity medications patient began to improve regarding hypertonicity.  Eventually started on provigil with improvements in alertness.  Evaluated by rehab physician who recommended increasing provigil dose further.  Subsequently patient has become more alert.  Most days she is able to track and with encouragement able to follow simple commands especially since partial obstruction glasses placed.  See progress note dictated 4/27 for expanded details.   2/09 Admit for [place] 2/10 EVD placed, later intubated due to obtundation and respiratory arrest 2/11 MRI Brain, hypertonic saline d/c'd, EVD not working.  Husband did not consent to PICC  2/13 EVD removed 2/17 [place] discussion with family, PMT 2/24 Bedside trach and PEG 3/1 Transferred to [facility]  Subjective: Alert.  Patient witnessed holding right leg up and attempting to extend as if she is performing her own physical therapy.  Denied pain in right knee when asked.  Objective: Vitals:   02/08/21 0359  02/08/21 0754  BP: (!) 147/101   Pulse: 95 87  Resp: 16 16  Temp: 98.6 F (37 C)   SpO2: 100% 99%    Intake/Output Summary (Last 24 hours) at 02/08/2021 0803 Last data filed at 02/08/2021 0400 Gross per 24 hour  Intake 50 ml  Output 300 ml  Net -250 ml      Filed Weights   01/28/21 0500 02/02/21 0500 02/08/21 0500  Weight: 62.5 kg 60.3 kg 63 kg    Exam:       Gen: Alert, calm and in no acute distress Respiratory: #4.0 cuffless trach, trach collar with FiO2 21% at 5L; anterior lung sounds remain clear, no cough noted during exam, no tracheal secretions. Cardiovascular: S1-S2, normotensive, no peripheral edema, bedside monitor  reveals persistent sinus rhythm Abdomen:  PEG /nocturnal tube feedings. LBM 7/23; soft nontender with normoactive bowel sounds.  Variable oral intake that is improved when family feeds patient. Neurological: Less left facial droop resolved.  Appears to have hearing loss.  Moves right upper extremity purposefully strength 4/5. RLE strength unable to be assessed, noted to move purposely.  Has left-side neglect. Psychiatric: Awake.  Responds to verbal questions.  Tracks people in room.  Slowly and weakly follows commands  Assessment/Plan: Acute problems: Multiple acute strokes  Right Thalamus ICH with IVH  Obstructive Hydrocephalus/left-sided neglect /Non TBI related hypertonicity -Continue baclofen and tizanidine for spasticity. Continue Provigil and rehabilitative therapies -Continues to work consistently with therapies.   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  Tracheostomy dependent -Primary barrier to decannulation is poor/very weak cough reflex -Trach team has had several discussions with patient's husband outlining current limitations for decannulation and uncertainty regarding this would even be possible in the future -At this time is not a candidate for decannulation -7/21 patient self decannulated, trach replaced easily by RT  Recurrent right knee pain/known ligamentous injury and chronic osteoarthritis -s/p injection of Marcaine and Depomedrol 6/30 -Continue scheduled Celebrex, Tylenol and Ultram  Hypertensive emergency/Malignant HTN -Continue Norvasc, beta-blocker and hydralazine.  Dysphagia -Continue dysphagia 1 with thin liquids -Inconsistent oral intake therefore has been resumed on nocturnal tube feedings  History of urinary retention -Continue Cardura    Other problems: Right upper extremity thrombophlebitis - [name] -> age indeterminate SVT of right cephalic vein  Neck Contracture/torticollis -- Resolved  ESBL positive Enterobacter UTI -Has completed 5 days of  meropenem -Contact isolation during hospitalization -continue Oxybutynin suspected bladder spasms  Klebsiella UTI -Resistant to nitrofurantoin and ampicillin -To minimize risk of recurrent UTI have asked that pure wick only be used at bedtime on a 10 PM to 8 AM schedule  Recurrent diarrhea -Resolved  Bacterial Conjunctivitis /periorbital cellulitis -Resolved  Chronic pansinusitis/recurrent fevers 2/2 aspiration PNA;History of Serratia Pneumonia  Hospital Acquired Pneumonia:  -Completed several courses of antibiotics -Follow-up imaging including sinus CT consistent with chronic but stable and resolving sinusitis   Anemia -Stable  HLD -Continue Lipitor      Data Reviewed: Basic Metabolic Panel: Recent Labs  Lab 02/04/21 0008  PHOS 3.6    Liver Function Tests: No results for input(s): AST, ALT, ALKPHOS, BILITOT, PROT, ALBUMIN in the last 168 hours.    CBC: No results for input(s): WBC, NEUTROABS, HGB, HCT, MCV, PLT in the last 168 hours.    CBG: Recent Labs  Lab 02/04/21 2009 02/05/21 0000 02/05/21 0433 02/05/21 2117 02/05/21 2353  GLUCAP 125* 167* 129* 125* 161*     Studies: No results found.   Scheduled Meds:  acetaminophen  650  mg Per Tube Q6H   amLODipine  10 mg Per Tube Daily   atorvastatin  40 mg Per Tube Daily   baclofen  10 mg Per Tube 3 times per day   carvedilol  25 mg Per Tube BID WC   celecoxib  200 mg Per Tube BID   chlorhexidine gluconate (MEDLINE KIT)  15 mL Mouth Rinse BID   Chlorhexidine Gluconate Cloth  6 each Topical Daily   diclofenac Sodium  4 g Topical QID   doxazosin  2 mg Per Tube Daily   feeding supplement  237 mL Oral BID BM   feeding supplement (JEVITY 1.5 CAL/FIBER)  900 mL Per Tube Q24H   fiber  1 packet Per Tube BID   free water  200 mL Per Tube Q4H   hydrALAZINE  37.5 mg Per Tube Q8H   mouth rinse  15 mL Mouth Rinse 5 X Daily   modafinil  200 mg Per Tube Daily   multivitamin with minerals  1 tablet Oral Daily    pantoprazole sodium  40 mg Per Tube Daily   tiZANidine  4 mg Per Tube QHS   Continuous Infusions:  sodium chloride Stopped (10/26/20 1445)    Principal Problem:   ICH (intracerebral hemorrhage) ([place]) Active Problems:   Acute hypoxemic respiratory failure (HCC)   Tracheostomy dependent ([place])   Palliative care by specialist   Muscle hypertonicity   Oropharyngeal dysphagia   Poor prognosis   Aspiration pneumonia of right lower lobe (HCC)   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  Palliative care.   PCCM continues to follow.  No decannulation.   Procedures: Tracheostomy PEG tube EEG Echocardiogram  Antibiotics: Vancomycin 2/12-2/15 Maxipime 2/12-2/15 Ancef 2/19-2/27 Vancomycin 2/20 x 1 dose Maxipime 2/27- 3/5 Vancomycin 3/1-3/3 Ancef 3/6-3/14 Cipro 3/20 4-3/30 Unasyn 3/30-4/4 Rocephin 4/11-4/12 Meropenem 4/12-4/17   Time spent: 15 minutes    Erin Hearing ANP  [facility] 7 am - 330 pm/M-F for direct patient care and secure chat Please refer to [place] for contact info 165  Days

## 2021-02-09 LAB — GLUCOSE, CAPILLARY
Glucose-Capillary: 137 mg/dL — ABNORMAL HIGH (ref 70–99)
Glucose-Capillary: 157 mg/dL — ABNORMAL HIGH (ref 70–99)
Glucose-Capillary: 153 mg/dL — ABNORMAL HIGH (ref 70–99)
Glucose-Capillary: 98 mg/dL (ref 70–99)
Glucose-Capillary: 123 mg/dL — ABNORMAL HIGH (ref 70–99)
Glucose-Capillary: 122 mg/dL — ABNORMAL HIGH (ref 70–99)

## 2021-02-09 MED ORDER — BACLOFEN 1 MG/ML ORAL SUSPENSION
5.0000 mg | ORAL | Status: DC
  Administered 2021-02-09 – 2021-02-28 (×57): 5 mg
  Filled 2021-02-09 (×62): qty 5

## 2021-02-09 MED ORDER — TIZANIDINE HCL 4 MG PO TABS: 2.0000 mg | ORAL_TABLET | Freq: Every day | ORAL | Status: DC

## 2021-02-09 NOTE — Progress Notes (Signed)
[facility] PROGRESS NOTE  Charlene [name] MLY:[id number] DOB: [birth year] DOA: 08/26/2020 PCP: Patient, [name] (Inactive)    October 08, 2020     10/16/2020                        10/16/2020     10/18/2020                        10/20/2020      12/30/2020                   12/30/2020   01/04/2021   Status: Remains inpatient appropriate because:Altered mental status and Unsafe d/c plan: Requirement to discharge to trach capable SNF   Dispo: The patient is from: Home              Anticipated d/c is to: SNF trach capable[place]-we have recently discovered that the [facility] may have a trach bed available in clinicals have been sent to the facility for review -trach team re eval 6/3 and still rec NOT to decannulate 2/2 and lack of appropriate voluntary and involuntary cough response              Patient currently is medically stable to d/c.   Difficult to place patient Yes   Level of care: Telemetry Medical  Code Status: Full Family Communication: [name] [phone]) is her POA and legal contact.  [name] at bedside on 7/26.  Also updated daughter [name] by telephone on 7/26 DVT prophylaxis: Chronically bedbound.  5/6: We will discontinue prophylactic Lovenox   Vaccination status: Unvaccinated-husband still undecided.  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  Subsequently on 2/10, she developed  obtundation and respiratory arrest leading to intubation.  She has remained persistently obtunded, now has a trach and a PEG.  Hospital course complicated by persistent fever, Serratia pneumonia and now pre-orbital cellulitis  As of 3/25 with adjustments in hypertonicity medications patient began to improve regarding hypertonicity.  Eventually started on provigil with improvements in alertness.  Evaluated by rehab physician who recommended increasing provigil dose further.  Subsequently patient has become more alert.  Most days she is able to track and with encouragement able to follow simple commands especially since partial obstruction glasses placed.  See progress note dictated 4/27 for expanded details.   2/09 Admit for [name] 2/10 EVD placed, later intubated due to obtundation and respiratory arrest 2/11 MRI Brain, hypertonic saline d/c'd, EVD not working.  Husband did not consent to PICC  2/13 EVD removed 2/17 [place] discussion with family, PMT 2/24 Bedside trach and PEG 3/1 Transferred to [facility]  Subjective: Examined earlier this morning.  Patient awake.  Attempting to phonate but speech is an audible.  Later in the afternoon I returned to the bedside to speak with patient's husband.  The second day in a row patient was reported as being "sleepy".  This has correlated with recent administration of muscle relaxers.  Discussed with husband and plan is to adjust dosing of skeletal muscle relaxers.  Objective: Vitals:   02/09/21 0354 02/09/21 0405  BP: (!) 146/95 (!) 146/95  Pulse: 94 96  Resp: 18 18  Temp: 98.5 F (36.9 C)   SpO2: 97% 99%    Intake/Output Summary (Last 24 hours) at 02/09/2021 0753 Last data filed at 02/09/2021 0356 Gross per 24 hour  Intake 480 ml  Output 650 ml  Net -170 ml      Filed Weights   02/02/21 0500 02/08/21 0500 02/09/21 0440  Weight: 60.3 kg 63 kg 62.9 kg    Exam:       Gen: Initially awake this morning but on return visit patient more sleepy but  otherwise in no acute distress Respiratory: #4.0 cuffless trach, trach collar with FiO2 21% at 5L; anterior lung sounds remain clear to auscultation, no increased work of breathing, no tracheal secretions, no coughing, drooling or choking. Cardiovascular: Normotensive, no resting tachycardia, no peripheral edema, normal heart sounds. Abdomen:  PEG /nocturnal tube feedings. LBM 7/24-soft nontender nondistended.  Ate 100% of lunch tray. Neurological: Less left facial droop resolved.  Appears to have hearing loss.  Moves right upper extremity purposefully strength 4/5. RLE strength unable to be assessed, noted to move purposely.  Has left-side neglect. Psychiatric: Alert initially this morning, more sleepy later this afternoon after receipt of SMRs.  Inaudible phonation so unable to accurately determine orientation.  Assessment/Plan: Acute problems: Multiple acute strokes  Right Thalamus ICH with IVH  Obstructive Hydrocephalus/left-sided neglect /Non TBI related hypertonicity -Given persistent daytime sleepiness we will decrease baclofen from 10 mg to 5 mg 3 times daily and discontinue nighttime tizanidine.  If continues to have issues with daytime sleepiness may need to either decrease dose further versus decrease frequency of baclofen.  Continue Provigil and rehabilitative therapies -Continues to work consistently with therapies.   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  Tracheostomy dependent -Primary barrier to decannulation is poor/very weak cough reflex -Trach team has had several discussions with patient's husband outlining current limitations for decannulation and uncertainty regarding this would even be possible in the future -At this time is not a candidate for decannulation -7/21 patient self decannulated, trach replaced easily by RT  Recurrent right knee pain/known ligamentous injury and chronic osteoarthritis -s/p injection of Marcaine and Depomedrol 6/30 -Continue scheduled  Celebrex, Tylenol and Ultram  Hypertensive emergency/Malignant HTN -Continue Norvasc, beta-blocker and hydralazine.  Dysphagia -Continue dysphagia 1 with thin liquids -Inconsistent oral intake therefore has been resumed on nocturnal tube feedings  History of urinary retention -Continue Cardura    Other problems: Right upper extremity thrombophlebitis - [name] -> age indeterminate SVT of right cephalic vein  Neck Contracture/torticollis -- Resolved  ESBL positive Enterobacter UTI -Has completed 5 days of meropenem -Contact isolation during hospitalization -continue Oxybutynin suspected bladder spasms  Klebsiella UTI -Resistant to nitrofurantoin and ampicillin -To minimize risk of recurrent UTI have asked that pure wick only be used at bedtime on a 10 PM to 8 AM schedule  Recurrent diarrhea -Resolved  Bacterial Conjunctivitis /periorbital cellulitis -Resolved  Chronic pansinusitis/recurrent fevers 2/2 aspiration PNA;History of Serratia Pneumonia  Hospital Acquired Pneumonia:  -Completed several courses of antibiotics -Follow-up imaging including sinus CT consistent with chronic but stable and resolving sinusitis   Anemia -Stable  HLD -Continue Lipitor      Data Reviewed: Basic Metabolic Panel: Recent Labs  Lab 02/04/21 0008  PHOS 3.6  Liver Function Tests: No results for input(s): AST, ALT, ALKPHOS, BILITOT, PROT, ALBUMIN in the last 168 hours.    CBC: No results for input(s): WBC, NEUTROABS, HGB, HCT, MCV, PLT in the last 168 hours.    CBG: Recent Labs  Lab 02/08/21 0846 02/08/21 1121 02/08/21 1506 02/08/21 1944 02/08/21 2310  GLUCAP 152* 130* 91 104* 116*     Studies: No results found.   Scheduled Meds:  acetaminophen  650 mg Per Tube Q6H   amLODipine  10 mg Per Tube Daily   atorvastatin  40 mg Per Tube Daily   baclofen  10 mg Per Tube 3 times per day   carvedilol  25 mg Per Tube BID WC   celecoxib  200 mg Per Tube BID    chlorhexidine gluconate (MEDLINE KIT)  15 mL Mouth Rinse BID   Chlorhexidine Gluconate Cloth  6 each Topical Daily   diclofenac Sodium  4 g Topical QID   doxazosin  2 mg Per Tube Daily   feeding supplement  237 mL Oral BID BM   feeding supplement (JEVITY 1.5 CAL/FIBER)  900 mL Per Tube Q24H   fiber  1 packet Per Tube BID   free water  200 mL Per Tube Q4H   hydrALAZINE  37.5 mg Per Tube Q8H   mouth rinse  15 mL Mouth Rinse 5 X Daily   modafinil  200 mg Per Tube Daily   multivitamin with minerals  1 tablet Oral Daily   pantoprazole sodium  40 mg Per Tube Daily   tiZANidine  4 mg Per Tube QHS   Continuous Infusions:  sodium chloride Stopped (10/26/20 1445)    Principal Problem:   ICH (intracerebral hemorrhage) ([place]) Active Problems:   Acute hypoxemic respiratory failure (HCC)   Tracheostomy dependent ([place])   Palliative care by specialist   Muscle hypertonicity   Oropharyngeal dysphagia   Poor prognosis   Aspiration pneumonia of right lower lobe ([place])   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  Palliative care.   PCCM continues to follow.  No decannulation.   Procedures: Tracheostomy PEG tube EEG Echocardiogram  Antibiotics: Vancomycin 2/12-2/15 Maxipime 2/12-2/15 Ancef 2/19-2/27 Vancomycin 2/20 x 1 dose Maxipime 2/27- 3/5 Vancomycin 3/1-3/3 Ancef 3/6-3/14 Cipro 3/20 4-3/30 Unasyn 3/30-4/4 Rocephin 4/11-4/12 Meropenem 4/12-4/17   Time spent: 15 minutes    Erin Hearing ANP  [facility] 7 am - 330 pm/M-F for direct patient care and secure chat Please refer to [place] for contact info 166  Days

## 2021-02-09 NOTE — Plan of Care (Signed)
  Problem: Coping: Goal: Will identify appropriate support needs Outcome: Progressing   Problem: Health Behavior/Discharge Planning: Goal: Ability to manage health-related needs will improve Outcome: Progressing   Problem: Self-Care: Goal: Verbalization of feelings and concerns over difficulty with self-care will improve Outcome: Progressing Goal: Ability to communicate needs accurately will improve Outcome: Progressing   Problem: Nutrition: Goal: Risk of aspiration will decrease Outcome: Progressing   Problem: Intracerebral Hemorrhage Tissue Perfusion: Goal: Complications of Intracerebral Hemorrhage will be minimized Outcome: Progressing   Problem: Education: Goal: Knowledge of General Education information will improve Description: Including pain rating scale, medication(s)/side effects and non-pharmacologic comfort measures Outcome: Progressing   Problem: Health Behavior/Discharge Planning: Goal: Ability to manage health-related needs will improve Outcome: Progressing   Problem: Clinical Measurements: Goal: Ability to maintain clinical measurements within normal limits will improve Outcome: Progressing Goal: Will remain free from infection Outcome: Progressing Goal: Diagnostic test results will improve Outcome: Progressing Goal: Respiratory complications will improve Outcome: Progressing Goal: Cardiovascular complication will be avoided Outcome: Progressing   Problem: Coping: Goal: Level of anxiety will decrease Outcome: Progressing   Problem: Elimination: Goal: Will not experience complications related to bowel motility Outcome: Progressing Goal: Will not experience complications related to urinary retention Outcome: Progressing   Problem: Pain Managment: Goal: General experience of comfort will improve Outcome: Progressing   Problem: Skin Integrity: Goal: Risk for impaired skin integrity will decrease Outcome: Progressing   Problem: Intracerebral  Hemorrhage Tissue Perfusion: Goal: Complications of Intracerebral Hemorrhage will be minimized Outcome: Progressing   Problem: Ischemic Stroke/TIA Tissue Perfusion: Goal: Complications of ischemic stroke/TIA will be minimized Outcome: Progressing

## 2021-02-09 NOTE — Progress Notes (Addendum)
Daughter Joyce Gross called, asked about visitation, was told patient already had two visitors for the day, patient's mother and husband, but would double check policy and call her back and let her know if she was allowed to visit.  Daughter called back at 1800, was informed that visitation policy stands, spoke with charge RN Ireti and policy was reiterated. Daughter stated "I am here downstairs" Daughter had brought some personal care items for patient, NS went downstairs to bring items up, which are now at bedside

## 2021-02-10 LAB — GLUCOSE, CAPILLARY: Glucose-Capillary: 164 mg/dL — ABNORMAL HIGH (ref 70–99)

## 2021-02-10 NOTE — Progress Notes (Signed)
Patient does not have a cough or gag response with deep oral suction x 2 attempts by RN & RT. RN reports a moderate cough / gag reflex with tracheal suctioning.

## 2021-02-10 NOTE — Progress Notes (Addendum)
[facility] PROGRESS NOTE  Charlene [name] ZOX:[id number] DOB: 11-19-1965 DOA: 08/26/2020 PCP: Patient, [name] (Inactive)    October 08, 2020     10/16/2020                        10/16/2020     10/18/2020                        10/20/2020      12/30/2020                   12/30/2020   01/04/2021   Status: Remains inpatient appropriate because:Altered mental status and Unsafe d/c plan: Requirement to discharge to trach capable SNF   Dispo: The patient is from: Home              Anticipated d/c is to: SNF trach capable-[place] available-we have recently discovered that the [facility] may have a trach bed available in clinicals have been sent to the facility for review -trach team re eval 7/25 and rec NOT to decannulate 2/2 and lack of appropriate voluntary and involuntary cough response              Patient currently is medically stable to d/c.   Difficult to place patient Yes   Level of care: Telemetry Medical  Code Status: Full Family Communication: [name] [phone]) is her POA and legal contact.  [name] at bedside on 7/26.  Also updated daughter [name] by telephone on 7/26 DVT prophylaxis: Chronically bedbound.  5/6: We will discontinue prophylactic Lovenox   Vaccination status: Unvaccinated-husband still undecided.  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  Subsequently on 2/10, she developed  obtundation and respiratory arrest leading to intubation.  She has remained persistently obtunded, now has a trach and a PEG.  Hospital course complicated by persistent fever, Serratia pneumonia and now pre-orbital cellulitis  As of 3/25 with adjustments in hypertonicity medications patient began to improve regarding hypertonicity.  Eventually started on provigil with improvements in alertness.  Evaluated by rehab physician who recommended increasing provigil dose further.  Subsequently patient has become more alert.  Most days she is able to track and with encouragement able to follow simple commands especially since partial obstruction glasses placed.  See progress note dictated 4/27 for expanded details.   2/09 Admit for [place] 2/10 EVD placed, later intubated due to obtundation and respiratory arrest 2/11 MRI Brain, hypertonic saline d/c'd, EVD not working.  Husband did not consent to PICC  2/13 EVD removed 2/17 [place] discussion with family, PMT 2/24 Bedside trach and PEG 3/1 Transferred to [facility]  Subjective: Much more alert today after adjustment in baclofen dose and discontinuation of time tizanidine on 7/26.  Patient very interactive.  No apparent complaints of pain.  Attempting to vocalize but phonation inaudible.  Objective: Vitals:   02/10/21 0309 02/10/21 0456  BP: (!) 147/87  135/77  Pulse:  (!) 101  Resp:  16  Temp: 98.6 F (37 C)   SpO2:  100%   No intake or output data in the 24 hours ending 02/10/21 0802     Filed Weights   02/08/21 0500 02/09/21 0440 02/10/21 0433  Weight: 63 kg 62.9 kg 62.3 kg    Exam:       Gen: Alert, in no acute distress, appears to be comfortable Respiratory: #4.0 cuffless trach, trach collar with FiO2 21% at 5L-anterior lung sounds remain clear to auscultation without any increased work of breathing, no coughing, choking-managing oral secretions Cardiovascular: S1-S2, tensive, no peripheral edema. Abdomen:  PEG /nocturnal tube feedings. LBM  7/26-nontender.  Intake of oral diet has increased to 100% of the past 24 hours. Neurological: Less left facial droop resolved.  Appears to have hearing loss.  Moves right upper extremity purposefully strength 4/5. RLE strength unable to be assessed, noted to move purposely.  Has left-side neglect. Psychiatric: Awake and much more alert than yesterday.  Interacting and attempting to phonate.  Oriented to name.  Following simple commands and waved at me when I left the room.  Given inability to understand phonation unable to clearly determine orientation  Assessment/Plan: Acute problems: Multiple acute strokes  Right Thalamus ICH with IVH  Obstructive Hydrocephalus/left-sided neglect /Non TBI related hypertonicity -Given persistent daytime sleepiness we will decrease baclofen from 10 mg to 5 mg 3 times daily and discontinue nighttime tizanidine.  If continues to have issues with daytime sleepiness may need to either decrease dose further versus decrease frequency of baclofen.  Continue Provigil and rehabilitative therapies -Continues to work consistently with therapies.   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  Unclear how much SMRs influencing patient's cough reflex.  Recurrent right knee pain/known ligamentous injury and chronic osteoarthritis -s/p injection of Marcaine and Depomedrol 6/30 -Continue scheduled Celebrex, Tylenol and Ultram  Hypertensive emergency/Malignant HTN -Continue Norvasc, beta-blocker and  hydralazine.  Dysphagia -Continue dysphagia 1 with thin liquids -Inconsistent oral intake therefore has been resumed on nocturnal tube feedings  History of urinary retention -Continue Cardura    Other problems: Right upper extremity thrombophlebitis - [name] -> age indeterminate SVT of right cephalic vein  Neck Contracture/torticollis -- Resolved  ESBL positive Enterobacter UTI -Has completed 5 days of meropenem -Contact isolation during hospitalization -continue Oxybutynin suspected bladder spasms  Klebsiella UTI -Resistant to nitrofurantoin and ampicillin -To minimize risk of recurrent UTI have asked that pure wick only be used at bedtime on a 10 PM to 8 AM schedule  Recurrent diarrhea -Resolved  Bacterial Conjunctivitis /periorbital cellulitis -Resolved  Chronic pansinusitis/recurrent fevers 2/2 aspiration PNA;History of Serratia Pneumonia  Hospital Acquired Pneumonia:  -Completed several courses of antibiotics -Follow-up imaging including sinus CT consistent with chronic but stable and resolving sinusitis   Anemia -Stable  HLD -Continue Lipitor      Data Reviewed: Basic Metabolic Panel: Recent Labs  Lab 02/04/21 0008  PHOS 3.6    Liver Function Tests: No results for input(s): AST, ALT, ALKPHOS, BILITOT, PROT, ALBUMIN in the last 168 hours.  CBC: No results for input(s): WBC, NEUTROABS, HGB, HCT, MCV, PLT in the last 168 hours.    CBG: Recent Labs  Lab 02/09/21 0850 02/09/21 1106 02/09/21 1657 02/09/21 1933 02/09/21 2309  GLUCAP 157* 153* 98 123* 122*     Studies: No results found.   Scheduled Meds:  acetaminophen  650 mg Per Tube Q6H   amLODipine  10 mg Per Tube Daily   atorvastatin  40 mg Per Tube Daily   baclofen  5 mg Per Tube 3 times per day   carvedilol  25 mg Per Tube BID WC   celecoxib  200 mg Per Tube BID   chlorhexidine gluconate (MEDLINE KIT)  15 mL Mouth Rinse BID   Chlorhexidine Gluconate Cloth  6 each Topical  Daily   diclofenac Sodium  4 g Topical QID   doxazosin  2 mg Per Tube Daily   feeding supplement  237 mL Oral BID BM   feeding supplement (JEVITY 1.5 CAL/FIBER)  900 mL Per Tube Q24H   fiber  1 packet Per Tube BID   free water  200 mL Per Tube Q4H   hydrALAZINE  37.5 mg Per Tube Q8H   mouth rinse  15 mL Mouth Rinse 5 X Daily   modafinil  200 mg Per Tube Daily   multivitamin with minerals  1 tablet Oral Daily   pantoprazole sodium  40 mg Per Tube Daily   Continuous Infusions:  sodium chloride Stopped (10/26/20 1445)    Principal Problem:   ICH (intracerebral hemorrhage) ([place]) Active Problems:   Acute hypoxemic respiratory failure (HCC)   Tracheostomy dependent ([place])   Palliative care by specialist   Muscle hypertonicity   Oropharyngeal dysphagia   Poor prognosis   Aspiration pneumonia of right lower lobe (HCC)   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  Palliative care.   PCCM continues to follow.  No decannulation.   Procedures: Tracheostomy PEG tube EEG Echocardiogram  Antibiotics: Vancomycin 2/12-2/15 Maxipime 2/12-2/15 Ancef 2/19-2/27 Vancomycin 2/20 x 1 dose Maxipime 2/27- 3/5 Vancomycin 3/1-3/3 Ancef 3/6-3/14 Cipro 3/20 4-3/30 Unasyn 3/30-4/4 Rocephin 4/11-4/12 Meropenem 4/12-4/17   Time spent: 15 minutes    Erin Hearing ANP  [facility] 7 am - 330 pm/M-F for direct patient care and secure chat Please refer to [place] for contact info 167  Days

## 2021-02-11 ENCOUNTER — Inpatient Hospital Stay (HOSPITAL_COMMUNITY): Payer: Medicaid Other

## 2021-02-11 LAB — URINALYSIS, COMPLETE (UACMP) WITH MICROSCOPIC
Bilirubin Urine: NEGATIVE
Glucose, UA: NEGATIVE mg/dL
Hgb urine dipstick: NEGATIVE
Ketones, ur: NEGATIVE mg/dL
Leukocytes,Ua: NEGATIVE
Nitrite: NEGATIVE
Protein, ur: NEGATIVE mg/dL
Specific Gravity, Urine: 1.01 (ref 1.005–1.030)
pH: 7 (ref 5.0–8.0)

## 2021-02-11 LAB — CBC WITH DIFFERENTIAL/PLATELET
Abs Immature Granulocytes: 0.01 10*3/uL (ref 0.00–0.07)
Basophils Absolute: 0 10*3/uL (ref 0.0–0.1)
Basophils Relative: 1 %
Eosinophils Absolute: 0 10*3/uL (ref 0.0–0.5)
Eosinophils Relative: 1 %
HCT: 33.5 % — ABNORMAL LOW (ref 36.0–46.0)
Hemoglobin: 10.9 g/dL — ABNORMAL LOW (ref 12.0–15.0)
Immature Granulocytes: 0 %
Lymphocytes Relative: 27 %
Lymphs Abs: 1.4 10*3/uL (ref 0.7–4.0)
MCH: 25.1 pg — ABNORMAL LOW (ref 26.0–34.0)
MCHC: 32.5 g/dL (ref 30.0–36.0)
MCV: 77 fL — ABNORMAL LOW (ref 80.0–100.0)
Monocytes Absolute: 0.3 10*3/uL (ref 0.1–1.0)
Monocytes Relative: 6 %
Neutro Abs: 3.3 10*3/uL (ref 1.7–7.7)
Neutrophils Relative %: 65 %
Platelets: 282 10*3/uL (ref 150–400)
RBC: 4.35 MIL/uL (ref 3.87–5.11)
RDW: 15.5 % (ref 11.5–15.5)
WBC: 5.1 10*3/uL (ref 4.0–10.5)
nRBC: 0 % (ref 0.0–0.2)

## 2021-02-11 LAB — COMPREHENSIVE METABOLIC PANEL
ALT: 31 U/L (ref 0–44)
AST: 24 U/L (ref 15–41)
Albumin: 3.6 g/dL (ref 3.5–5.0)
Alkaline Phosphatase: 75 U/L (ref 38–126)
Anion gap: 10 (ref 5–15)
BUN: 14 mg/dL (ref 6–20)
CO2: 30 mmol/L (ref 22–32)
Calcium: 9.3 mg/dL (ref 8.9–10.3)
Chloride: 98 mmol/L (ref 98–111)
Creatinine, Ser: 0.6 mg/dL (ref 0.44–1.00)
GFR, Estimated: >60 mL/min (ref >60)
Glucose, Bld: 133 mg/dL — ABNORMAL HIGH (ref 70–99)
Potassium: 3.5 mmol/L (ref 3.5–5.1)
Sodium: 138 mmol/L (ref 135–145)
Total Bilirubin: 0.4 mg/dL (ref 0.3–1.2)
Total Protein: 6.9 g/dL (ref 6.5–8.1)

## 2021-02-11 LAB — GLUCOSE, CAPILLARY: Glucose-Capillary: 128 mg/dL — ABNORMAL HIGH (ref 70–99)

## 2021-02-11 LAB — LACTIC ACID, PLASMA: Lactic Acid, Venous: 1.3 mmol/L (ref 0.5–1.9)

## 2021-02-11 LAB — PROCALCITONIN: Procalcitonin: <0.1 ng/mL

## 2021-02-11 IMAGING — DX DG CHEST 1V PORT
1 series · 1 of 1 positions shown · non-contrast
Comparison: [DATE]

CLINICAL DATA: Suspected aspiration; fever

EXAM:
PORTABLE CHEST 1 VIEW

[chest]
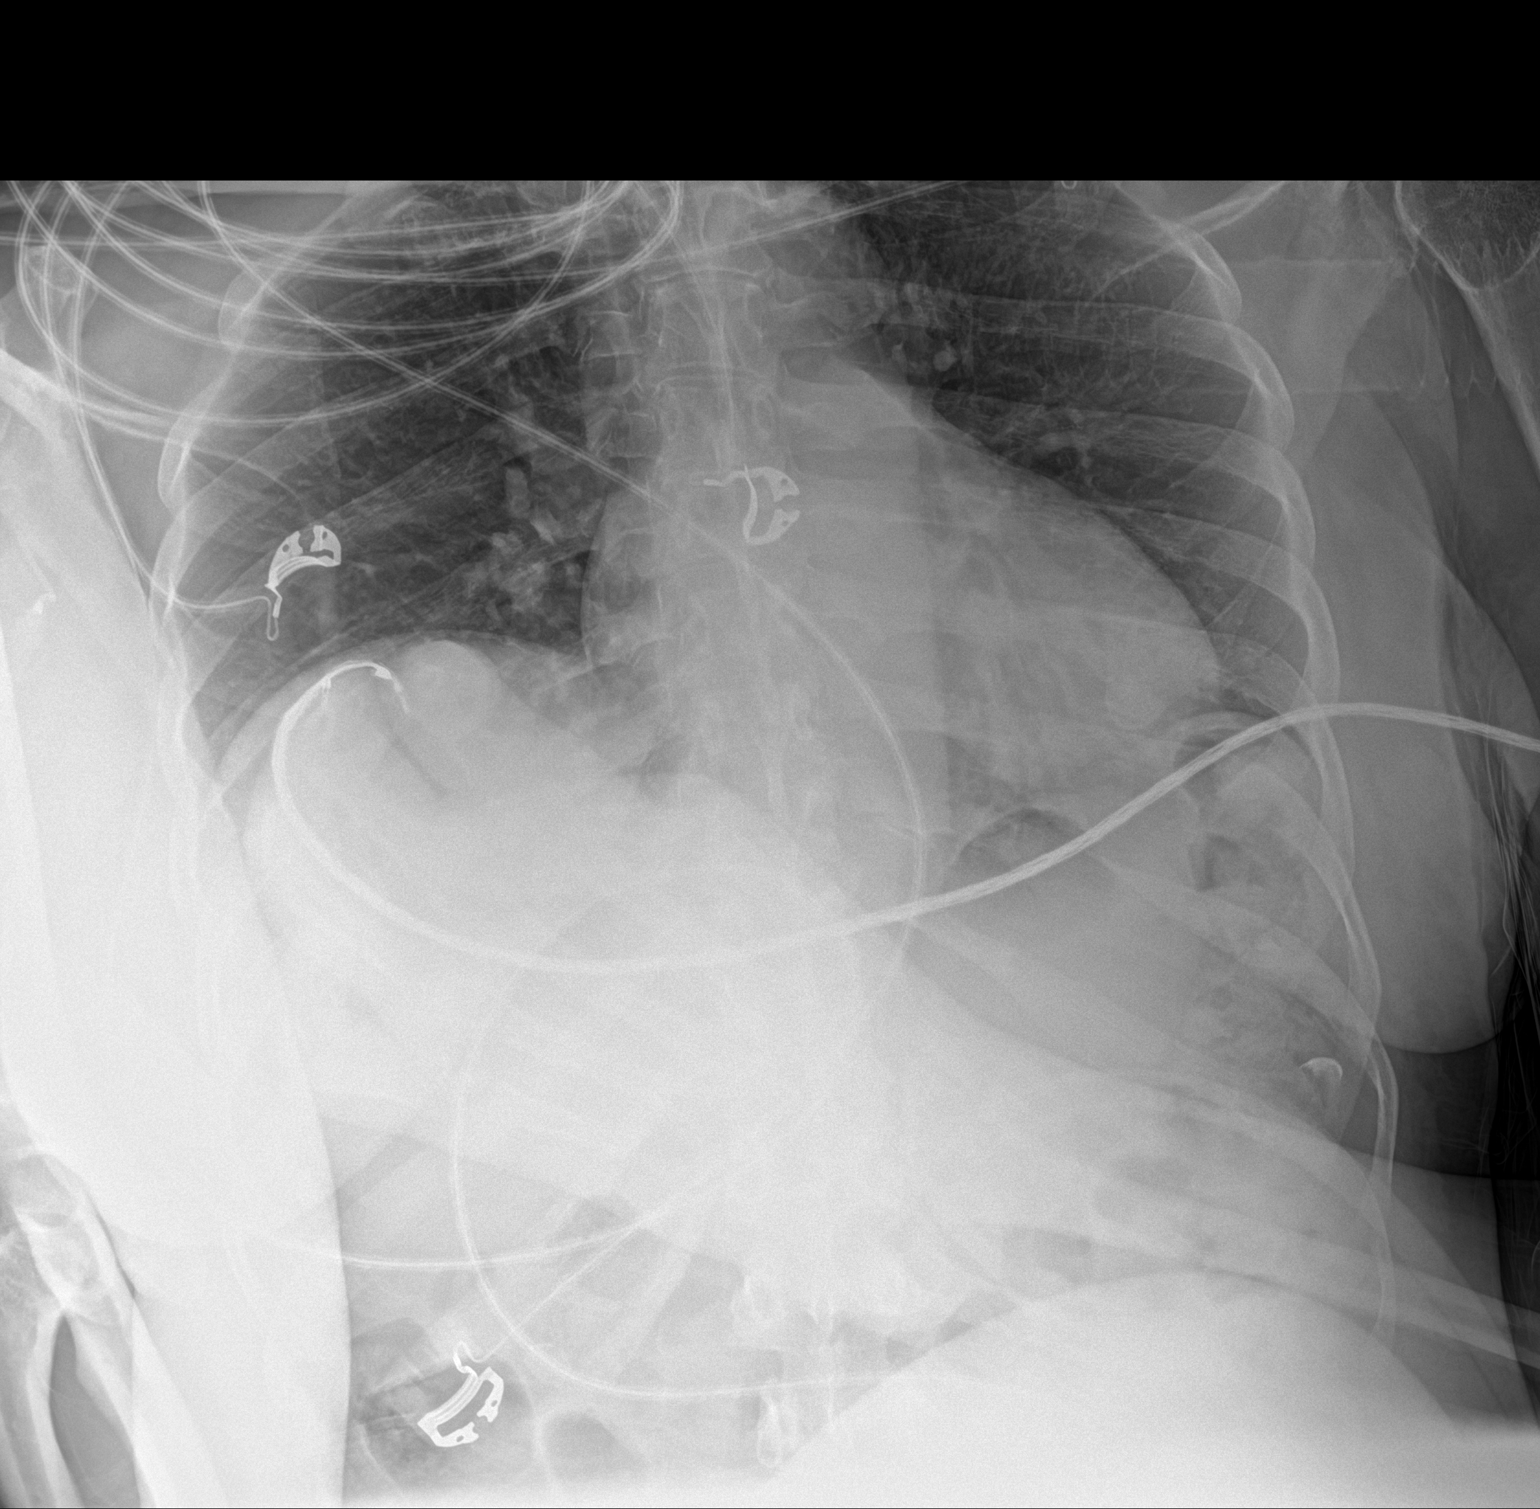

[1 of 1 positions shown; findings below may reference images not displayed]

FINDINGS: The cardiomediastinal silhouette is unchanged in
contour.Tracheostomy tube. Evaluation of the RIGHT lower lung is
limited secondary to overlying arm. No pleural effusion. No
pneumothorax. No acute pleuroparenchymal abnormality. Visualized
abdomen is unremarkable. No acute osseous abnormality.
IMPRESSION: No acute cardiopulmonary abnormality within the limitations of the
exam.

## 2021-02-11 NOTE — Progress Notes (Signed)
EKG CRITICAL VALUE     12 lead EKG performed.  Critical value noted. Marzella Schlein , RN notified.   Alto [place], CCT 02/11/2021 5:04 PM

## 2021-02-11 NOTE — Progress Notes (Addendum)
Nutrition Follow-up  DOCUMENTATION CODES:   Not applicable  INTERVENTION:  -48-72 hour calorie count, RD will follow-up with results on Monday, August 1st -Continue Ensure Enlive po BID -Continue MVI with minerals daily  Continue nocturnal TF via PEG:  -Jevity 1.5 @ 59m/hr for 12 hours from 2000-0800 ([id number]) Nocturnal TF regimen will provide 1350 kcals, 57 grams protein, 68412mree water (Meets ~84% and ~72% minimum estimated calorie and protein needs, respectively)   -continue 200m51mee water flushes Q4H (provides 1884ml70mal free water with flushes and TF) -d/c nutrisource fiber as pt is eating fiber-containing foods and nocturnal TF formula contains fiber   NUTRITION DIAGNOSIS:   Inadequate oral intake related to inability to eat as evidenced by NPO status.  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  Subsequently on 2/10, pt developed obtundation and respiratory arrest leading to intubation. Pt is now s/p trach/PEG placement. Hospital course complicated by Serratia pneumonia, ESBL Enterobacter UTI and pre-orbital cellulitis.  2/10 s/p EVD placement and intubation for respiratory arrest 2/13 EVD removed 2/17 palliative care consult; family requesting tx to [facility] 2/24 s/p trach and PEG 3/1 tx to [facility] trach changed to shiley #6 cuffless 4/08 pt w/ sepsis 2/2 UTI 4/18 28% ATC/5L 5/2 CIR reevaluated pt and determined she has not made enough improvement for admit; recommended SNF 5/8 trach changed to shiley #4 cuffless 5/23 CCM evaluated  and determined pt stilll unable to decannulate due to variable mental status 6/3 trach exchanged, still shiley #4 cuffless 6/30 s/p R knee aspiration and injection 7/3 trach exchanged, still shiley #4 cuffless 7/8 diet advanced to dysphagia 1 with thin liquids 7/17 trach exchanged, still shiley #4 cuffless  CCM re-evaluated pt on 7/25 and recommended not decannulating pt 2/2 lack of appropriate voluntary and involuntary cough response. Pt is otherwise medically stable for discharge and is pending bed availability at trach-capable SNF.   Pt has been on a dysphagia 1 diet with thin liquids, last 8 meal completions charted as 10-100% (46% average intake). Pt has orders for Ensure po BID and, per RN documentation, has been accepting supplements, but actual amount being consumed is unclear at this time. Pt has been receiving nocturnal TF via PEG to help meet nutritional needs. Current regimen: Jevity 1.5 @ 75ml/34mdministered over 12 hours. Will order 48-72  hour calorie count so that adequacy of po intake can be assessed and TF regimen can be adjusted as appropriate. Discussed calorie count and plan with RN.   No UOP documented x 24 hours No edema per RN assessment   Admit weight: 61.2 kg Current weight: 62.3 kg  Medications:  acetaminophen  650 mg Per Tube Q6H   amLODipine  10 mg Per Tube Daily   atorvastatin  40 mg Per Tube Daily   baclofen  5 mg Per Tube 3 times per day   carvedilol  25 mg Per Tube BID WC   celecoxib  200 mg Per Tube BID   chlorhexidine gluconate (MEDLINE KIT)  15 mL Mouth Rinse BID   Chlorhexidine Gluconate Cloth  6 each  Topical Daily   diclofenac Sodium  4 g Topical QID   doxazosin  2 mg Per Tube Daily   feeding supplement  237 mL Oral BID BM   feeding supplement (JEVITY 1.5 CAL/FIBER)  900 mL Per Tube Q24H   free water  200 mL Per Tube Q4H   hydrALAZINE  37.5 mg Per Tube Q8H   mouth rinse  15 mL Mouth Rinse 5 X Daily   modafinil  200 mg Per Tube Daily    multivitamin with minerals  1 tablet Oral Daily   pantoprazole sodium  40 mg Per Tube Daily   Labs: Recent Labs  Lab 02/11/21 0936  NA 138  K 3.5  CL 98  CO2 30  BUN 14  CREATININE 0.60  CALCIUM 9.3  GLUCOSE 133*   Diet Order:   Diet Order             DIET - DYS 1 Room service appropriate? No; Fluid consistency: Thin  Diet effective now                   EDUCATION NEEDS:   No education needs have been identified at this time  Skin:  Skin Assessment: Reviewed RN Assessment  Last BM:  7/27  Height:   Ht Readings from Last 1 Encounters:  10/18/20 '5\' 5"'  (1.651 m)    Weight:   Wt Readings from Last 1 Encounters:  02/10/21 62.3 kg    Ideal Body Weight:  56.8 kg  BMI:  Body mass index is 22.86 kg/m.  Estimated Nutritional Needs:   Kcal:  1600-1800  Protein:  80-90 grams  Fluid:  >1.6L/d    Larkin Ina, MS, RD, LDN (she/her/hers) RD pager number and weekend/on-call pager number located in [place].

## 2021-02-11 NOTE — Progress Notes (Signed)
CSW spoke with Marlene Lard to discuss tomorrow's court hearing - the hearing is scheduled for 2pm.  CSW spoke with Dr. Kendrick Fries who is agreeable to participate in court hearing tomorrow - CSW will provide the Webex link once it becomes available.  Edwin Dada, MSW, LCSW Transitions of Care  Clinical Social Worker II [phone]

## 2021-02-11 NOTE — Progress Notes (Signed)
RN notified me, tele showed ST elevated. 12 lead obtained which showed ST elevation. Appears to be an artifact. She is non verbal and doesn't appears to be in any pain. Vitals are stable. Will trend trops and monitor for now.   Stephania Fragmin MD

## 2021-02-11 NOTE — Progress Notes (Signed)
[facility] PROGRESS NOTE  KORTLYN KOLTZ ASN:[id number] DOB: August 30, 1965 DOA: 08/26/2020 PCP: Patient, [name] (Inactive)    October 08, 2020     10/16/2020                        10/16/2020     10/18/2020                        10/20/2020      12/30/2020                   12/30/2020   01/04/2021   Status: Remains inpatient appropriate because:Altered mental status and Unsafe d/c plan: Requirement to discharge to trach capable SNF   Dispo: The patient is from: Home              Anticipated d/c is to: SNF trach capable-[place]-we have recently discovered that the [facility] may have a trach bed available in clinicals have been sent to the facility for review -trach team re eval 7/25 and rec NOT to decannulate 2/2 and lack of appropriate voluntary and involuntary cough response              Patient currently is medically stable to d/c.   Difficult to place patient Yes   Level of care: Telemetry Medical  Code Status: Full Family Communication: [name] [phone]) is her POA and legal contact.  [name] at bedside on 7/26.  Also updated daughter [name] by telephone on 7/26 DVT prophylaxis: Chronically bedbound.  5/6: We will discontinue prophylactic Lovenox   Vaccination status: Unvaccinated-husband still undecided.  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  Subsequently on 2/10, she developed  obtundation and respiratory arrest leading to intubation.  She has remained persistently obtunded, now has a trach and a PEG.  Hospital course complicated by persistent fever, Serratia pneumonia and now pre-orbital cellulitis  As of 3/25 with adjustments in hypertonicity medications patient began to improve regarding hypertonicity.  Eventually started on provigil with improvements in alertness.  Evaluated by rehab physician who recommended increasing provigil dose further.  Subsequently patient has become more alert.  Most days she is able to track and with encouragement able to follow simple commands especially since partial obstruction glasses placed.  See progress note dictated 4/27 for expanded details.   2/09 Admit for [place] [facility] 2/10 EVD placed, later intubated due to obtundation and respiratory arrest 2/11 MRI Brain, hypertonic saline d/c'd, EVD not working.  Husband did not consent to PICC  2/13 EVD removed 2/17 [place] discussion with family, PMT 2/24 Bedside trach and PEG 3/1 Transferred to [facility]  Subjective: Slightly sleepier than yesterday and appears uncomfortable.  Skin hot to the touch.  Peers to have some suprapubic tenderness.  Objective: Vitals:   02/11/21 0310 02/11/21 0343  BP:  131/79  Pulse:  83  Resp:  16  Temp:  98 F (  36.7 C)  SpO2: 98% 99%   No intake or output data in the 24 hours ending 02/11/21 0743     Filed Weights   02/08/21 0500 02/09/21 0440 02/10/21 0433  Weight: 63 kg 62.9 kg 62.3 kg    Exam:       Gen: Mild distress as evidenced by suprapubic tenderness and low-grade fever Respiratory: #4.0 cuffless trach, trach collar with FiO2 21% at 5L-lungs remain clear, no increased tracheal secretions, no cough. Cardiovascular: Normotensive, normal heart sounds, no peripheral edema Abdomen:  PEG /nocturnal tube feedings. LBM 7/27-soft and focally tender over suprapubic region.  Hypoactive bowel sounds noted. Genitourinary: Bladder scan with 310 cc of  urine Neurological: Less left facial droop resolved.  Appears to have hearing loss.  Moves right upper extremity purposefully strength 4/5. RLE strength unable to be assessed, noted to move purposely.  Has left-side neglect.  No increased hypertonicity with decrease in baclofen Psychiatric: Awake but clearly appears uncomfortable.  Not tracking as well as previous  Assessment/Plan: Acute problems: Multiple acute strokes  Right Thalamus ICH with IVH  Obstructive Hydrocephalus/left-sided neglect /Non TBI related hypertonicity -On 7/26 due to daytime sleepiness baclofen decreased from 10 mg to 5 mg and nighttime tizanidine discontinued with significant improvement and patient alertness.  Continue Provigil and rehabilitative therapies -Continues to work consistently with therapies.   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  Low-grade fever -Axillary temp of 98.9 which equals an oral temp of 99.9 -Chest x-ray unremarkable, no leukocytosis, procalcitonin normal, lactic acid normal -Await cath urinalysis and culture -Continue to follow -LBM 7/27 so at this juncture do not suspect constipation contributing   Tracheostomy dependent -Primary barrier to decannulation is poor/very weak cough reflex -Trach team has had several discussions with patient's husband outlining current limitations for decannulation and uncertainty regarding this would even be possible in the future -At this time is not a candidate for decannulation -7/21 patient self decannulated, trach replaced easily by RT -7/27 RT performed oral tracheal suction without patient demonstrating cough reflex or gag reflex.  Recurrent right knee pain/known ligamentous injury and chronic osteoarthritis -s/p injection of Marcaine and Depomedrol 6/30 -Continue scheduled Celebrex, Tylenol and Ultram  Hypertensive emergency/Malignant HTN -Continue Norvasc, beta-blocker and hydralazine.  Dysphagia -Continue dysphagia 1 with thin  liquids -Inconsistent oral intake therefore has been resumed on nocturnal tube feedings  History of urinary retention -Continue Cardura    Other problems: Right upper extremity thrombophlebitis - [name] -> age indeterminate SVT of right cephalic vein  Neck Contracture/torticollis -- Resolved  ESBL positive Enterobacter UTI -Has completed 5 days of meropenem -Contact isolation during hospitalization -continue Oxybutynin suspected bladder spasms  Klebsiella UTI -Resistant to nitrofurantoin and ampicillin -To minimize risk of recurrent UTI have asked that pure wick only be used at bedtime on a 10 PM to 8 AM schedule  Recurrent diarrhea -Resolved  Bacterial Conjunctivitis /periorbital cellulitis -Resolved  Chronic pansinusitis/recurrent fevers 2/2 aspiration PNA;History of Serratia Pneumonia  Hospital Acquired Pneumonia:  -Completed several courses of antibiotics -Follow-up imaging including sinus CT consistent with chronic but stable and resolving sinusitis   Anemia -Stable  HLD -Continue Lipitor      Data Reviewed: Basic Metabolic Panel: No results for input(s): NA, K, CL, CO2, GLUCOSE, BUN, CREATININE, CALCIUM, MG, PHOS in the last 168 hours.   Liver Function Tests: No results for input(s): AST, ALT, ALKPHOS, BILITOT, PROT, ALBUMIN in the last 168 hours.    CBC: No results for input(s): WBC, NEUTROABS, HGB, HCT, MCV, PLT in  the last 168 hours.    CBG: Recent Labs  Lab 02/09/21 1106 02/09/21 1657 02/09/21 1933 02/09/21 2309 02/10/21 0420  GLUCAP 153* 98 123* 122* 164*     Studies: No results found.   Scheduled Meds:  acetaminophen  650 mg Per Tube Q6H   amLODipine  10 mg Per Tube Daily   atorvastatin  40 mg Per Tube Daily   baclofen  5 mg Per Tube 3 times per day   carvedilol  25 mg Per Tube BID WC   celecoxib  200 mg Per Tube BID   chlorhexidine gluconate (MEDLINE KIT)  15 mL Mouth Rinse BID   Chlorhexidine Gluconate Cloth  6 each Topical  Daily   diclofenac Sodium  4 g Topical QID   doxazosin  2 mg Per Tube Daily   feeding supplement  237 mL Oral BID BM   feeding supplement (JEVITY 1.5 CAL/FIBER)  900 mL Per Tube Q24H   fiber  1 packet Per Tube BID   free water  200 mL Per Tube Q4H   hydrALAZINE  37.5 mg Per Tube Q8H   mouth rinse  15 mL Mouth Rinse 5 X Daily   modafinil  200 mg Per Tube Daily   multivitamin with minerals  1 tablet Oral Daily   pantoprazole sodium  40 mg Per Tube Daily   Continuous Infusions:  sodium chloride Stopped (10/26/20 1445)    Principal Problem:   ICH (intracerebral hemorrhage) ([place]) Active Problems:   Acute hypoxemic respiratory failure (HCC)   Tracheostomy dependent ([place])   Palliative care by specialist   Muscle hypertonicity   Oropharyngeal dysphagia   Poor prognosis   Aspiration pneumonia of right lower lobe (HCC)   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  Palliative care.   PCCM continues to follow.  No decannulation.   Procedures: Tracheostomy PEG tube EEG Echocardiogram  Antibiotics: Vancomycin 2/12-2/15 Maxipime 2/12-2/15 Ancef 2/19-2/27 Vancomycin 2/20 x 1 dose Maxipime 2/27- 3/5 Vancomycin 3/1-3/3 Ancef 3/6-3/14 Cipro 3/20 4-3/30 Unasyn 3/30-4/4 Rocephin 4/11-4/12 Meropenem 4/12-4/17   Time spent: 15 minutes    Erin Hearing ANP  [facility] 7 am - 330 pm/M-F for direct patient care and secure chat Please refer to [place] for contact info 168  Days

## 2021-02-12 LAB — TROPONIN I (HIGH SENSITIVITY)
Troponin I (High Sensitivity): 3 ng/L (ref <18)
Troponin I (High Sensitivity): 4 ng/L (ref <18)

## 2021-02-12 LAB — GLUCOSE, CAPILLARY: Glucose-Capillary: 103 mg/dL — ABNORMAL HIGH (ref 70–99)

## 2021-02-12 MED ORDER — SODIUM CHLORIDE 0.9 % IV SOLN
1.0000 g | INTRAVENOUS | Status: DC
  Administered 2021-02-12: 1 g via INTRAVENOUS
  Filled 2021-02-12 (×2): qty 10

## 2021-02-12 NOTE — Progress Notes (Signed)
[facility] PROGRESS NOTE  Charlene [name] VOZ:[id number] DOB: Oct 12, 1965 DOA: 08/26/2020 PCP: Patient, [name] (Inactive)    October 08, 2020     10/16/2020                        10/16/2020     10/18/2020                        10/20/2020      12/30/2020                   12/30/2020   01/04/2021   Status: Remains inpatient appropriate because:Altered mental status and Unsafe d/c plan: Requirement to discharge to trach capable SNF   Dispo: The patient is from: Home              Anticipated d/c is to: SNF trach capable-[place]-we have recently discovered that the [facility] may have a trach bed available in clinicals have been sent to the facility for review -trach team re eval 7/25 and rec NOT to decannulate 2/2 and lack of appropriate voluntary and involuntary cough response              Patient currently is medically stable to d/c.   Difficult to place patient Yes   Level of care: Telemetry Medical  Code Status: Full Family Communication: [name] [phone]) is her POA and legal contact.  [name] at bedside on 7/26.  Also updated daughter [name] by telephone on 7/26 DVT prophylaxis: Chronically bedbound.  5/6: We will discontinue prophylactic Lovenox   Vaccination status: Unvaccinated-husband still undecided.  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  Subsequently on 2/10, she developed obtundation and respiratory arrest leading to intubation.  She has remained persistently obtunded, now has a trach and a PEG.  Hospital course complicated by persistent fever, Serratia pneumonia and now pre-orbital cellulitis  As of 3/25 with adjustments in hypertonicity medications patient began to improve regarding hypertonicity.  Eventually started on provigil with improvements in alertness.  Evaluated by rehab physician who recommended increasing provigil dose further.  Subsequently patient has become more alert.  Most days she is able to track and with encouragement able to follow simple commands especially since partial obstruction glasses placed.  See progress note dictated 4/27 for expanded details.   2/09 Admit for [name] 2/10 EVD placed, later intubated due to obtundation and respiratory arrest 2/11 MRI Brain, hypertonic saline d/c'd, EVD not working.  Husband did not consent to PICC  2/13 EVD removed 2/17 [place] discussion with family, PMT 2/24 Bedside trach and PEG 3/1 Transferred to [facility]  Subjective: More awake today and appears comfortable.  Nursing tech at bedside working carefully to assist patient with eating to ensure she is given foods that she  prefers.  Patient did not like the waffles so blueberry was substituted.  Patient very thirsty and kept pointing at juices on side table.  Objective: Vitals:   02/12/21 0414 02/12/21 0734  BP:  119/69  Pulse: 83 97  Resp: 18 18  Temp:    SpO2: 98% 100%   No intake or output data in the 24 hours ending 02/12/21 0804     Filed Weights   02/08/21 0500 02/09/21 0440 02/10/21 0433  Weight: 63 kg 62.9 kg 62.3 kg    Exam:       Gen: Awake and alert, no acute distress Respiratory: #4.0 cuffless trach, trach collar with FiO2 21% at 5L-anterior lung sounds clear to auscultation.  No  coughing. Cardiovascular: Normotensive, no tachycardia.  No peripheral edema. Abdomen:  PEG /nocturnal tube feedings. LBM 7/27-soft nontender nondistended Genitourinary: Bladder scan 7/28 with 310 cc of urine Neurological: Less left facial droop resolved.  Appears to have hearing loss.  Moves right upper extremity purposefully strength 4/5. RLE strength unable to be assessed, noted to move purposely.  Has left-side neglect.    Assessment/Plan: Acute problems: Multiple acute strokes  Right Thalamus ICH with IVH  Obstructive Hydrocephalus/left-sided neglect /Non TBI related hypertonicity -On 7/26 due to daytime sleepiness baclofen decreased from 10 mg to 5 mg and nighttime tizanidine discontinued with significant improvement and patient alertness.  Continue Provigil and rehabilitative therapies -Continues to work consistently with therapies.   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  Urine culture positive for greater than 100,000 colonies of gram-negative rods -7/29 begin empiric IV Rocephin for total of 3 days for likely mild uncomplicated recurrent cystitis  Chronic abnormal EKG/telemetry  -She has demonstrated recurrent issues with telemetry tracings that have nonspecific ST segment downsloping and/or ST segment elevation especially in the V leads. -Had recurrence yesterday but given her mentation unable to verbalize if having chest pain and after was evaluated by attending physician patient appeared to be comfortable otherwise -Had similar episode recently with normal troponin -Recent echo unremarkable with preserved LVEF and no evidence of LVH -For completeness of evaluation we will repeat troponin  today  Tracheostomy dependent -Primary barrier to decannulation is poor/very weak cough reflex -Trach team has had several discussions with patient's husband outlining current limitations for decannulation and uncertainty regarding this would even be possible in the future -At this time is not a candidate for decannulation -7/21 patient self decannulated, trach replaced easily by RT -7/27 RT performed oral tracheal suction without patient demonstrating cough reflex or gag reflex.  Recurrent right knee pain/known ligamentous injury and chronic osteoarthritis -s/p injection of Marcaine and Depomedrol 6/30 -Continue scheduled Celebrex, Tylenol and Ultram  Hypertensive emergency/Malignant HTN -Continue Norvasc, beta-blocker and hydralazine.  Dysphagia -Continue dysphagia 1 with thin liquids -Inconsistent oral intake therefore has been resumed on nocturnal tube feedings  History of urinary retention -Continue Cardura    Other problems: Right upper extremity thrombophlebitis - [name] -> age indeterminate SVT of right cephalic vein  Neck Contracture/torticollis -- Resolved  ESBL positive Enterobacter UTI -Has completed 5 days of meropenem -Contact isolation during hospitalization -continue Oxybutynin suspected bladder spasms  Klebsiella UTI -Resistant to nitrofurantoin and ampicillin -To minimize risk of recurrent UTI have asked that pure wick only be used at bedtime on a 10  PM to 8 AM schedule  Recurrent diarrhea -Resolved  Bacterial Conjunctivitis /periorbital cellulitis -Resolved  Chronic pansinusitis/recurrent fevers 2/2 aspiration PNA;History of Serratia Pneumonia  Hospital Acquired Pneumonia:  -Completed several courses of antibiotics -Follow-up imaging including sinus CT consistent with chronic but stable and resolving sinusitis   Anemia -Stable  HLD -Continue Lipitor      Data Reviewed: Basic Metabolic Panel: Recent Labs  Lab 02/11/21 0936  NA 138  K  3.5  CL 98  CO2 30  GLUCOSE 133*  BUN 14  CREATININE 0.60  CALCIUM 9.3     Liver Function Tests: Recent Labs  Lab 02/11/21 0936  AST 24  ALT 31  ALKPHOS 75  BILITOT 0.4  PROT 6.9  ALBUMIN 3.6      CBC: Recent Labs  Lab 02/11/21 0936  WBC 5.1  NEUTROABS 3.3  HGB 10.9*  HCT 33.5*  MCV 77.0*  PLT 282      CBG: Recent Labs  Lab 02/09/21 1657 02/09/21 1933 02/09/21 2309 02/10/21 0420 02/11/21 2138  GLUCAP 98 123* 122* 164* 128*     Studies: DG CHEST PORT 1 VIEW  Result Date: 02/11/2021 CLINICAL DATA:  Suspected aspiration; fever EXAM: PORTABLE CHEST 1 VIEW COMPARISON:  January 16, 2021 FINDINGS: The cardiomediastinal silhouette is unchanged in contour.Tracheostomy tube. Evaluation of the RIGHT lower lung is limited secondary to overlying arm. No pleural effusion. No pneumothorax. No acute pleuroparenchymal abnormality. Visualized abdomen is unremarkable. No acute osseous abnormality. IMPRESSION: No acute cardiopulmonary abnormality within the limitations of the exam. Electronically Signed   By: Valentino Saxon MD   On: 02/11/2021 11:54     Scheduled Meds:  acetaminophen  650 mg Per Tube Q6H   amLODipine  10 mg Per Tube Daily   atorvastatin  40 mg Per Tube Daily   baclofen  5 mg Per Tube 3 times per day   carvedilol  25 mg Per Tube BID WC   celecoxib  200 mg Per Tube BID   chlorhexidine gluconate (MEDLINE KIT)  15 mL Mouth Rinse BID   Chlorhexidine Gluconate Cloth  6 each Topical Daily   diclofenac Sodium  4 g Topical QID   doxazosin  2 mg Per Tube Daily   feeding supplement  237 mL Oral BID BM   feeding supplement (JEVITY 1.5 CAL/FIBER)  900 mL Per Tube Q24H   free water  200 mL Per Tube Q4H   hydrALAZINE  37.5 mg Per Tube Q8H   mouth rinse  15 mL Mouth Rinse 5 X Daily   modafinil  200 mg Per Tube Daily   multivitamin with minerals  1 tablet Oral Daily   pantoprazole sodium  40 mg Per Tube Daily   Continuous Infusions:  sodium chloride Stopped  (10/26/20 1445)    Principal Problem:   ICH (intracerebral hemorrhage) ([place]) Active Problems:   Acute hypoxemic respiratory failure (HCC)   Tracheostomy dependent ([place])   Palliative care by specialist   Muscle hypertonicity   Oropharyngeal dysphagia   Poor prognosis   Aspiration pneumonia of right lower lobe (HCC)   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  Palliative care.   PCCM continues to follow.  No decannulation.   Procedures: Tracheostomy PEG tube EEG Echocardiogram  Antibiotics: Vancomycin 2/12-2/15 Maxipime 2/12-2/15 Ancef 2/19-2/27  Vancomycin 2/20 x 1 dose Maxipime 2/27- 3/5 Vancomycin 3/1-3/3 Ancef 3/6-3/14 Cipro 3/20 4-3/30 Unasyn 3/30-4/4 Rocephin 4/11-4/12 Meropenem 4/12-4/17   Time spent: 15 minutes    Erin Hearing ANP  [facility] 7 am - 330 pm/M-F for direct patient care and secure chat Please refer to [place] for contact info 169  Days

## 2021-02-12 NOTE — Progress Notes (Signed)
Occupational Therapy Treatment Patient Details Name: Charlene [name] MRN: [id number] DOB: [birth year] Today's Date: 02/12/2021    History of present illness Pt is 55 y/o female presents to [facility] on 08/26/20 with L sided weakness and headaches. 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 s/p Right frontal ventricular catheter placement on 2/10, stopped functioning on 2/11.  MRI 2/11 shows right greater than left mesial temporal lobes / parahippocampal cortex, bilateral basal ganglia, and possibly the upper pons that are concerning for acute infarcts, most likely secondary to mass effect from the hemorrhage. ETT 2/10-2/24.  Pt with trach and PEG on 09/10/20. Hospital course complicated by persistent fever, Serratia pneumonia and now pre-orbital cellulitis.   Pt self decannulated 02/04/21, trach re-inserted by RT.  PMH includes anxiety, HTN.   OT comments  Patient session was to trial tilt bed, but patient found with BM this date.  Patient rolled with up to Max A and increase time to facilitate reaching and grasping side rail with R UE.  Patient was able to hold herself in a side lying position both lying left and right with the use of the side rail.  Patient is unable to assist with peri care, so that was dependent.  BM on her gown as well.  Total assist for upper body dressing.  Patient continues with R gaze preference, but she seemed to follow some commands when asked to reach and roll.  SNF continues to be recommended given extensive assist needed to meet her daily needs.  OT can reassess goals on 8/3 and determine and plan of care changes.    Follow Up Recommendations  SNF;Supervision/Assistance - 24 hour    Equipment Recommendations  None recommended by OT    Recommendations for Other Services      Precautions / Restrictions Precautions Precautions:  Fall Precaution Comments: peg and trach Restrictions Weight Bearing Restrictions: No       Mobility Bed Mobility Overal bed mobility: Needs Assistance Bed Mobility: Rolling Rolling: Max assist           Patient Response: Flat affect  Transfers                      ADL either performed or assessed with clinical judgement   ADL                               Toileting- Clothing Manipulation and Hygiene: Total assistance;Bed level                                                                                                   Pertinent Vitals/ Pain       Pain Assessment: Faces Faces Pain Scale: No hurt  Frequency  Min 2X/week        Progress Toward Goals  OT Goals(current goals can now be found in the care plan section)     Acute Rehab OT Goals Patient Stated Goal: Pt unable to state OT Goal Formulation: Patient unable to participate in goal setting Time For Goal Achievement: 02/16/21 Potential to Achieve Goals: Fair  Plan      Co-evaluation                 AM-PAC OT "6 Clicks" Daily Activity     Outcome Measure   Help from another person eating meals?: A Little Help from another person taking care of personal grooming?: A Lot Help from another person toileting, which includes using toliet, bedpan, or urinal?: Total Help from another person bathing (including washing, rinsing, drying)?: Total Help from another person to put on and taking off regular upper body clothing?: Total Help from another person to put on and taking off regular lower body clothing?: Total 6 Click Score: 9    End of Session Equipment Utilized During Treatment: Oxygen  OT Visit Diagnosis: Muscle weakness (generalized) (M62.81);Apraxia (R48.2);Cognitive communication deficit (R41.841);Hemiplegia and hemiparesis   Activity  Tolerance     Patient Left in bed;with call bell/phone within reach   Nurse Communication          Time: 5732-2025 OT Time Calculation (min): 24 min  Charges: OT General Charges $OT Visit: 1 Visit OT Treatments $Self Care/Home Management : 23-37 mins  02/12/2021  Rich, OTR/L  Acute Rehabilitation Services  Office:  [phone]    Suzanna Obey 02/12/2021, 3:50 PM

## 2021-02-12 NOTE — Progress Notes (Addendum)
10:30am: CSW spoke with [place] at [facility] who states the facility still has a bed to offer the patient once a determination is made regarding the patient's incompetency and a guardian is assigned.  The court hearing has been rescheduled for 02/24/21 at 2pm.  9am: CSW was notified by Jed Limerick that court has been postponed and will need to be rescheduled.  8am: CSW met with patient at bedside to check in - patient was alone, lying in bed upon arrival. Patient opened her eyes briefly but did not verbally interact. CSW attempted several times to obtain a verbal response but was unsuccessful.  [name], MSW, LCSW Transitions of Care  Clinical Social Worker II [phone]

## 2021-02-12 NOTE — Consult Note (Addendum)
Psychiatry was consulted to assess for patient's capacity to make a decision regarding her placement for disposition. On assessment this AM, 02/12/2021, patient was awake and moving her RUE, rubbing it across her legs. Patient did not appear to be able to look directly at provider when provider was standing on her L side. Provider moved to patient's R side to communicate. Patient was unable to make any vocal sounds during assessment. When attempting to make a non verbal way to communicate patient was able to give thumbs up when mirroring the provider but could not make a complete thumbs down. Patient was not able to blink on command. Patient could not fully communicate in a non verbal manner. Patient attention span was very short and patient would often stare into space when provider asked questions.  Provider asked patient if she understood why there was a court hearing for patient today. Patient was asked if she could communicate with a thumbs up or down. Patient was unable and appeared to look off and the question had to be repeated multiple times.   Patient is not able to effectively communicate that she understands choices and does not display that she understands that she has the ability to reason or appreciate the choices at hand. Patient's attention span is too short to appreciate and reason.   Patient does not have the capacity to make this decision regarding her placement. Patient does not appear to have the mental capacity to make any decision as her communication is rudimentary. Patient's attempts at communication are inconsistent and unreliable.    ---------------------------------------------------------------------------------- Capacity is a functional assessment and a clinical determination about a specific decision. Source: Sherryll Burger [place], [place], [place]. Ten myths about decisionmaking capacity. J Am Med Dir Assoc. 2004;5(4):263-267.  The are four principle  decision-making abilities that constitute capacity are: understanding, expressing a choice, appreciation and reasoning.   Understanding, is to know the meaning of the information given.  Individuals should be able to clearly communicate a choice when presented with multiple treatment options.  Individuals should be able to clearly communicate a choice when presented with multiple options.   Appreciation is reflected in not just knowing the facts  but applying those facts to one's own life is essential to make the decision authentic.  The ability to appreciate facts refers to people's ability to recognize how facts are relevant to themselves, and how it affects them now and could affect them  in the future.       The ability to Reason is the ability to compare options and to infer consequences of choice.   PGY-2 Eliseo Gum, MD

## 2021-02-12 NOTE — Progress Notes (Signed)
  Speech Language Pathology Treatment: Cognitive-Linquistic  Patient Details Name: Charlene [name] MRN: [id number] DOB: Jul 23, 1965 Today's Date: 02/12/2021 Time: 2423-5361 SLP Time Calculation (min) (ACUTE ONLY): 18 min  Assessment / Plan / Recommendation Clinical Impression  Pt's mother at bedside today.  PMV in place. Pt followed simple commands with RUE intermittently.  With max verbal/visual/tactile cues, pt mouthed occasional single words after modeling, but she appeared to be distracted/uncomfortable today, which interfered with her attention. Max cues needed to attend to left side - spontaneity of responses remains better when approached from right.  Educated [name] re: progress with swallowing and communication.  Goals updated to reflect progress. SLP will continue to follow.      HPI HPI: Pt is 55 y/o female presents to [facility] on 2/9 with L sided weakness and headaches. PMH includes anxiety, HTN. 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 S/p Right frontal ventricular catheter placement on 2/10, stopped functioning on 2/11.  MRI 2/11 shows right greater than left mesial temporal lobes / parahippocampal cortex, bilateral basal ganglia, and possibly the upper pons that are concerning for acute infarcts, most likely secondary to mass effect from the hemorrhage. ETT 2/10; trach/PEG 09/10/20.  Trach changed to #6 cuffless 3/11; #4 cuffless 5/4. Has had two MBS studies, the last on 7/8 - pt started on dysphagia 1, thin liquids. Trach replaced following self-decannulation 7/21.      SLP Plan  Continue with current plan of care       Recommendations  Diet recommendations: Dysphagia 1 (puree);Thin liquid Liquids provided via: Straw Medication Administration: Via alternative means Supervision: Staff to assist with self feeding;Trained caregiver to feed  patient;Full supervision/cueing for compensatory strategies Compensations: Minimize environmental distractions;Small sips/bites Postural Changes and/or Swallow Maneuvers: Seated upright 90 degrees      Patient may use Passy-Muir Speech Valve: During all waking hours (remove during sleep);During PO intake/meals PMSV Supervision: Intermittent         Plan: Continue with current plan of care       GO              Teyton Pattillo L. Samson [name], MA CCC/SLP Acute Rehabilitation Services Office number [phone] Pager [phone]   [name] [name] 02/12/2021, 1:34 PM

## 2021-02-13 LAB — URINE CULTURE: Culture: >=100000 — AB

## 2021-02-13 MED ORDER — CHLORHEXIDINE GLUCONATE 0.12 % MT SOLN
OROMUCOSAL | Status: AC
  Filled 2021-02-13: qty 15

## 2021-02-13 MED ORDER — TRAMADOL HCL 50 MG PO TABS
25.0000 mg | ORAL_TABLET | Freq: Two times a day (BID) | ORAL | Status: DC | PRN
Start: 2021-02-13 — End: 2021-04-16
  Administered 2021-02-13 – 2021-04-13 (×18): 25 mg
  Filled 2021-02-13 (×19): qty 1

## 2021-02-13 MED ORDER — ADULT MULTIVITAMIN W/MINERALS CH
1.0000 | ORAL_TABLET | Freq: Every day | ORAL | Status: DC
  Administered 2021-02-13 – 2021-04-16 (×63): 1
  Filled 2021-02-13 (×63): qty 1

## 2021-02-13 MED ORDER — FOSFOMYCIN TROMETHAMINE 3 G PO PACK
3.0000 g | PACK | Freq: Once | ORAL | Status: AC
  Administered 2021-02-13: 3 g via ORAL
  Filled 2021-02-13: qty 3

## 2021-02-13 NOTE — Progress Notes (Signed)
PROGRESS NOTE  Charlene [name]  HFW:[id number] DOB: 09/01/1965 DOA: 08/26/2020 PCP: Patient, [name] (Inactive)   Brief Narrative: Charlene [name] is a 55 y.o. female who initially presented to [facility] 08/26/2020 due to left sided weakness and headache found to have right thalamic ICH with intraventricular extension. External ventricular drain was placed 2/10 but stopped functioning and patient became obtunded 2/11 necessitating intubation. Ultimately had placement of tracheostomy and PEG (2/24). She self-decannulated 7/21 and trach was replaced. Hospital course complicated by ESBL UTI, Serratia pneumonia, and continued encephalopathy, tracheostomy dependence. The plan is to continue care at [facility] with tracheostomy care capability.   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  - Continue medications for spasticity - Continue PT/OT  GNR UTI: Possible recurrence (has had multiple UTIs).  - No leukocytosis, procalcitonin normal, lactic acid normal. Ceftriaxone started 7/29 with 3 days planned which we will continue. If organism speciates/shows resistance, would consider withholding further antimicrobial pending development of systemic infection.    Chronic abnormal EKG/telemetry: Troponins remain negative without other evidence of  ischemic heart disease. Echo unremarkable.  - Cardiology follow up.    Tracheostomy dependence:  - PCCM following 1-2x/week and prn: Primary barrier to decannulation is poor/very weak cough reflex - Trach team has had several discussions with patient's husband outlining current limitations for decannulation and uncertainty regarding this would even be possible in the future - RT performed oral tracheal suction without patient demonstrating cough reflex or gag reflex.   Recurrent right knee pain/known ligamentous injury and chronic osteoarthritis: s/p injection of Marcaine and Depomedrol 6/30 - Continue scheduled celecoxib tylenol, tramadol.   HTN:  - Continue norvasc, coreg, hydralazine.    Dysphagia: Continue dysphagia 1 with thin liquids - Inconsistent oral intake therefore has been resumed on nocturnal tube feedings - Appreciate SLP evaluations   History of urinary retention - Continue doxazosin. Avoid oxybutynin, etc.  - May need to bladder scan regularly and I/O if UTIs recur.   Right cephalic vein SVT: Local care.   Neck contracture/torticollis: Resolved   ESBL positive Enterobacter UTI: Has completed 5 days of meropenem - Contact isolation during hospitalization per IP.    Anemia of chronic disease: Stable.   HLD:  - Continue atorvastatin.    DVT prophylaxis: SCDs Code Status: Full Family Communication: None at bedside Disposition Plan:  Status is: Inpatient  Remains inpatient appropriate because:Unsafe d/c plan  Dispo: The patient is from: Home              Anticipated d/c is to: SNF              Patient currently is medically stable to d/c.   Difficult to place patient Yes  Subjective: Minimally verbal with at times inappropriate responses. Not eating a lot unless family is here to encourage her, though NT and RN at bedside are diligently working with her. No overnight events. No fevers.  Objective: Vitals:   02/13/21 0417 02/13/21 0714 02/13/21 1103  02/13/21 1144  BP:  133/86  (!) 141/82  Pulse:  99  88  Resp:  '18 16 18  ' Temp:  98.8 F (37.1 C)  98.6 F (37 C)  TempSrc:  Axillary  Oral  SpO2:  100%  100%  Weight: 60.9 kg     Height:        Intake/Output Summary (Last 24 hours) at 02/13/2021 1201 Last data filed at 02/12/2021 1230 Gross per 24 hour  Intake 120 ml  Output --  Net 120 ml   Filed Weights   02/09/21 0440 02/10/21 0433 02/13/21 0417  Weight: 62.9 kg 62.3 kg 60.9 kg    Gen: 55 y.o. female in no distress Pulm: Non-labored breathing thru trach. Clear to auscultation bilaterally.  CV: Regular rate and rhythm. No murmur, rub, or gallop. No JVD, no pitting pedal edema. GI: Abdomen soft, non-tender, non-distended, with normoactive bowel sounds. No organomegaly or masses felt. Ext: Warm, hypertonic Skin: No rashes, lesions or ulcers on visualized skin. PEG site unremarkable.  Neuro: Alert with left-sided deficits apparent, does not follow commands.   Psych: Calm, UTD.  Data Reviewed: I have personally reviewed following labs and imaging studies  CBC: Recent Labs  Lab 02/11/21 0936  WBC 5.1  NEUTROABS 3.3  HGB 10.9*  HCT 33.5*  MCV 77.0*  PLT 585   Basic Metabolic Panel: Recent Labs  Lab 02/11/21 0936  NA 138  K 3.5  CL 98  CO2 30  GLUCOSE 133*  BUN 14  CREATININE 0.60  CALCIUM 9.3   GFR: Estimated Creatinine Clearance: 73.2 mL/min (by C-G formula based on SCr of 0.6 mg/dL). Liver Function Tests: Recent Labs  Lab 02/11/21 0936  AST 24  ALT 31  ALKPHOS 75  BILITOT 0.4  PROT 6.9  ALBUMIN 3.6   No results for input(s): LIPASE, AMYLASE in the last 168 hours. No results for input(s): AMMONIA in the last 168 hours. Coagulation Profile: No results for input(s): INR, PROTIME in the last 168 hours. Cardiac Enzymes: No results for input(s): CKTOTAL, CKMB, CKMBINDEX, TROPONINI in the last 168 hours. BNP (last 3 results) No results for input(s): PROBNP in the last 8760 hours. HbA1C: No  results for input(s): HGBA1C in the last 72 hours. CBG: Recent Labs  Lab 02/09/21 1933 02/09/21 2309 02/10/21 0420 02/11/21 2138 02/12/21 2013  GLUCAP 123* 122* 164* 128* 103*   Lipid Profile: No results for input(s): CHOL, HDL, LDLCALC, TRIG, CHOLHDL, LDLDIRECT in the last 72 hours. Thyroid Function Tests: No results for input(s): TSH, T4TOTAL, FREET4, T3FREE, THYROIDAB in the last 72 hours. Anemia Panel: No results for input(s): VITAMINB12, FOLATE, FERRITIN, TIBC, IRON, RETICCTPCT in the last 72 hours. Urine analysis:    Component Value Date/Time   COLORURINE YELLOW 02/11/2021 1411   APPEARANCEUR HAZY (A) 02/11/2021 1411   LABSPEC 1.010 02/11/2021 1411   PHURINE 7.0 02/11/2021 1411   GLUCOSEU NEGATIVE 02/11/2021 1411   HGBUR NEGATIVE 02/11/2021 1411   BILIRUBINUR NEGATIVE 02/11/2021 1411   KETONESUR NEGATIVE 02/11/2021 1411   PROTEINUR NEGATIVE 02/11/2021 1411   UROBILINOGEN 0.2 02/21/2014 1629   NITRITE NEGATIVE 02/11/2021 1411   LEUKOCYTESUR NEGATIVE 02/11/2021 1411   Recent Results (from the past 240 hour(s))  Urine Culture     Status: Abnormal   Collection Time: 02/11/21  9:25 AM   Specimen: Urine, Catheterized  Result Value Ref Range Status   Specimen Description URINE, CATHETERIZED  Final   Special  Requests   Final    NONE Performed at [facility], [place] [address], [place], [place] [address]    Culture >=100,000 COLONIES/mL ENTEROBACTER CLOACAE (A)  Final   Report Status 02/13/2021 FINAL  Final   Organism ID, Bacteria ENTEROBACTER CLOACAE (A)  Final      Susceptibility   Enterobacter cloacae - MIC*    CEFAZOLIN >=64 RESISTANT Resistant     CEFEPIME >=32 RESISTANT Resistant     CIPROFLOXACIN 1 SENSITIVE Sensitive     GENTAMICIN <=1 SENSITIVE Sensitive     IMIPENEM 0.5 SENSITIVE Sensitive     NITROFURANTOIN 32 SENSITIVE Sensitive     TRIMETH/SULFA >=320 RESISTANT Resistant     PIP/TAZO 16 SENSITIVE Sensitive     * >=100,000 COLONIES/mL ENTEROBACTER  CLOACAE      Radiology Studies: No results found.  Scheduled Meds:  acetaminophen  650 mg Per Tube Q6H   amLODipine  10 mg Per Tube Daily   atorvastatin  40 mg Per Tube Daily   baclofen  5 mg Per Tube 3 times per day   carvedilol  25 mg Per Tube BID WC   celecoxib  200 mg Per Tube BID   chlorhexidine gluconate (MEDLINE KIT)  15 mL Mouth Rinse BID   Chlorhexidine Gluconate Cloth  6 each Topical Daily   diclofenac Sodium  4 g Topical QID   doxazosin  2 mg Per Tube Daily   feeding supplement  237 mL Oral BID BM   feeding supplement (JEVITY 1.5 CAL/FIBER)  900 mL Per Tube Q24H   free water  200 mL Per Tube Q4H   hydrALAZINE  37.5 mg Per Tube Q8H   mouth rinse  15 mL Mouth Rinse 5 X Daily   modafinil  200 mg Per Tube Daily   multivitamin with minerals  1 tablet Per Tube Daily   pantoprazole sodium  40 mg Per Tube Daily   Continuous Infusions:  sodium chloride Stopped (10/26/20 1445)   cefTRIAXone (ROCEPHIN)  IV 1 g (02/12/21 1358)     LOS: 170 days   Time spent: 25 minutes.  Patrecia Pour, MD [facility] [email or web address] 02/13/2021, 12:01 PM

## 2021-02-13 NOTE — Plan of Care (Signed)
  Problem: Coping: Goal: Will identify appropriate support needs Outcome: Progressing   Problem: Health Behavior/Discharge Planning: Goal: Ability to manage health-related needs will improve Outcome: Progressing   Problem: Self-Care: Goal: Verbalization of feelings and concerns over difficulty with self-care will improve Outcome: Progressing Goal: Ability to communicate needs accurately will improve Outcome: Progressing   Problem: Nutrition: Goal: Risk of aspiration will decrease Outcome: Progressing   Problem: Intracerebral Hemorrhage Tissue Perfusion: Goal: Complications of Intracerebral Hemorrhage will be minimized Outcome: Progressing   Problem: Education: Goal: Knowledge of General Education information will improve Description: Including pain rating scale, medication(s)/side effects and non-pharmacologic comfort measures Outcome: Progressing   Problem: Health Behavior/Discharge Planning: Goal: Ability to manage health-related needs will improve Outcome: Progressing   Problem: Clinical Measurements: Goal: Ability to maintain clinical measurements within normal limits will improve Outcome: Progressing Goal: Will remain free from infection Outcome: Progressing Goal: Diagnostic test results will improve Outcome: Progressing Goal: Respiratory complications will improve Outcome: Progressing Goal: Cardiovascular complication will be avoided Outcome: Progressing   Problem: Coping: Goal: Level of anxiety will decrease Outcome: Progressing   Problem: Elimination: Goal: Will not experience complications related to bowel motility Outcome: Progressing Goal: Will not experience complications related to urinary retention Outcome: Progressing   Problem: Pain Managment: Goal: General experience of comfort will improve Outcome: Progressing   Problem: Skin Integrity: Goal: Risk for impaired skin integrity will decrease Outcome: Progressing   Problem: Intracerebral  Hemorrhage Tissue Perfusion: Goal: Complications of Intracerebral Hemorrhage will be minimized Outcome: Progressing   Problem: Ischemic Stroke/TIA Tissue Perfusion: Goal: Complications of ischemic stroke/TIA will be minimized Outcome: Progressing   

## 2021-02-13 NOTE — Plan of Care (Signed)
  Problem: Coping: Goal: Will identify appropriate support needs 02/13/2021 1600 by Deliah Boston, RN Outcome: Progressing 02/13/2021 1558 by Deliah Boston, RN Outcome: Progressing   Problem: Health Behavior/Discharge Planning: Goal: Ability to manage health-related needs will improve 02/13/2021 1600 by Deliah Boston, RN Outcome: Progressing 02/13/2021 1558 by Deliah Boston, RN Outcome: Progressing   Problem: Self-Care: Goal: Verbalization of feelings and concerns over difficulty with self-care will improve 02/13/2021 1600 by Deliah Boston, RN Outcome: Progressing 02/13/2021 1558 by Deliah Boston, RN Outcome: Progressing Goal: Ability to communicate needs accurately will improve 02/13/2021 1600 by Deliah Boston, RN Outcome: Progressing 02/13/2021 1558 by Deliah Boston, RN Outcome: Progressing   Problem: Nutrition: Goal: Risk of aspiration will decrease 02/13/2021 1600 by Deliah Boston, RN Outcome: Progressing 02/13/2021 1558 by Deliah Boston, RN Outcome: Progressing   Problem: Intracerebral Hemorrhage Tissue Perfusion: Goal: Complications of Intracerebral Hemorrhage will be minimized 02/13/2021 1600 by Deliah Boston, RN Outcome: Progressing 02/13/2021 1558 by Deliah Boston, RN Outcome: Progressing   Problem: Education: Goal: Knowledge of General Education information will improve Description: Including pain rating scale, medication(s)/side effects and non-pharmacologic comfort measures 02/13/2021 1600 by Deliah Boston, RN Outcome: Progressing 02/13/2021 1558 by Deliah Boston, RN Outcome: Progressing   Problem: Health Behavior/Discharge Planning: Goal: Ability to manage health-related needs will improve 02/13/2021 1600 by Deliah Boston, RN Outcome: Progressing 02/13/2021 1558 by Deliah Boston, RN Outcome: Progressing   Problem: Clinical Measurements: Goal: Ability to maintain clinical measurements within normal limits will  improve 02/13/2021 1600 by Deliah Boston, RN Outcome: Progressing 02/13/2021 1558 by Deliah Boston, RN Outcome: Progressing Goal: Will remain free from infection 02/13/2021 1600 by Deliah Boston, RN Outcome: Progressing 02/13/2021 1558 by Deliah Boston, RN Outcome: Progressing Goal: Diagnostic test results will improve 02/13/2021 1600 by Deliah Boston, RN Outcome: Progressing 02/13/2021 1558 by Deliah Boston, RN Outcome: Progressing Goal: Respiratory complications will improve 02/13/2021 1600 by Deliah Boston, RN Outcome: Progressing 02/13/2021 1558 by Deliah Boston, RN Outcome: Progressing Goal: Cardiovascular complication will be avoided 02/13/2021 1600 by Deliah Boston, RN Outcome: Progressing 02/13/2021 1558 by Deliah Boston, RN Outcome: Progressing   Problem: Coping: Goal: Level of anxiety will decrease 02/13/2021 1600 by Deliah Boston, RN Outcome: Progressing 02/13/2021 1558 by Deliah Boston, RN Outcome: Progressing   Problem: Elimination: Goal: Will not experience complications related to bowel motility 02/13/2021 1600 by Deliah Boston, RN Outcome: Progressing 02/13/2021 1558 by Deliah Boston, RN Outcome: Progressing Goal: Will not experience complications related to urinary retention 02/13/2021 1600 by Deliah Boston, RN Outcome: Progressing 02/13/2021 1558 by Deliah Boston, RN Outcome: Progressing   Problem: Pain Managment: Goal: General experience of comfort will improve 02/13/2021 1600 by Deliah Boston, RN Outcome: Progressing 02/13/2021 1558 by Deliah Boston, RN Outcome: Progressing   Problem: Skin Integrity: Goal: Risk for impaired skin integrity will decrease 02/13/2021 1600 by Deliah Boston, RN Outcome: Progressing 02/13/2021 1558 by Deliah Boston, RN Outcome: Progressing   Problem: Intracerebral Hemorrhage Tissue Perfusion: Goal: Complications of Intracerebral Hemorrhage will be minimized 02/13/2021  1600 by Deliah Boston, RN Outcome: Progressing 02/13/2021 1558 by Deliah Boston, RN Outcome: Progressing   Problem: Ischemic Stroke/TIA Tissue Perfusion: Goal: Complications of ischemic stroke/TIA will be minimized 02/13/2021 1600 by Deliah Boston, RN Outcome: Progressing 02/13/2021 1558 by Deliah Boston, RN Outcome: Progressing

## 2021-02-14 NOTE — Progress Notes (Signed)
PROGRESS NOTE  Charlene [name]  BUL:[id number] DOB: [birth year] DOA: 08/26/2020 PCP: Patient, [name] (Inactive)   Brief Narrative: Charlene [name] is a 55 y.o. female who initially presented to [facility] 08/26/2020 due to left sided weakness and headache found to have right thalamic ICH with intraventricular extension. External ventricular drain was placed 2/10 but stopped functioning and patient became obtunded 2/11 necessitating intubation. Ultimately had placement of tracheostomy and PEG (2/24). She self-decannulated 7/21 and trach was replaced. Hospital course complicated by ESBL UTI, Serratia pneumonia, and continued encephalopathy, tracheostomy dependence. The plan is to continue care at [facility] with tracheostomy care capability.   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  - Continue medications for spasticity - Continue PT/OT  GNR UTI: Possible recurrence (has had multiple UTIs).  - No leukocytosis, procalcitonin normal, lactic acid normal. Ceftriaxone started 7/29 though resistance patterns remain significant. Since there's no suspicion for sepsis, will give fosfomycin x1 to complete treatment.     Chronic abnormal EKG/telemetry: Troponins remain negative without other evidence of ischemic heart disease. Echo unremarkable.  -  Cardiology follow up.    Tracheostomy dependence:  - PCCM following 1-2x/week and prn: Primary barrier to decannulation is poor/very weak cough reflex - Trach team has had several discussions with patient's husband outlining current limitations for decannulation and uncertainty regarding this would even be possible in the future - RT performed oral tracheal suction without patient demonstrating cough reflex or gag reflex.   Recurrent right knee pain/known ligamentous injury and chronic osteoarthritis: s/p injection of Marcaine and Depomedrol 6/30 - Continue scheduled celecoxib tylenol, tramadol.   HTN:  - Continue norvasc, coreg, hydralazine.    Dysphagia: Continue dysphagia 1 with thin liquids - Inconsistent oral intake therefore has been resumed on nocturnal tube feedings - Appreciate SLP evaluations   History of urinary retention - Continue doxazosin. Avoid oxybutynin, etc.  - May need to bladder scan regularly and I/O if UTIs recur.   Right cephalic vein SVT: Local care.   Neck contracture/torticollis: Resolved   ESBL positive Enterobacter UTI: Has completed 5 days of meropenem - Contact isolation during hospitalization per IP.    Anemia of chronic disease: Stable.   HLD:  - Continue atorvastatin.    DVT prophylaxis: SCDs Code Status: Full Family Communication: None at bedside Disposition Plan:  Status is: Inpatient  Remains inpatient appropriate because:Unsafe d/c plan  Dispo: The patient is from: Home              Anticipated d/c is to: SNF              Patient currently is medically stable to d/c.   Difficult to place patient Yes  Subjective: Not as interactive with me today. No events overnight. No fevers.  Objective: Vitals:   02/14/21 0329 02/14/21 0353 02/14/21 0500 02/14/21 0713  BP: (!) 150/92   (!) 142/93  Pulse: 90 93  93  Resp: '18 17  16  ' Temp: 98.2 F (36.8 C)   98 F (36.7 C)  TempSrc: Oral   Oral  SpO2: 99% 98%  100%  Weight:   62.7 kg    Height:        Intake/Output Summary (Last 24 hours) at 02/14/2021 0953 Last data filed at 02/14/2021 0327 Gross per 24 hour  Intake 98 ml  Output 500 ml  Net -402 ml   Filed Weights   02/10/21 0433 02/13/21 0417 02/14/21 0500  Weight: 62.3 kg 60.9 kg 62.7 kg   Gen: 55 y.o. female in no distress Pulm: Nonlabored breathing thru trach. Clear, mild secretions. CV: Regular rate and rhythm. No murmur, rub, or gallop. No JVD, no dependent edema. GI: Abdomen soft, non-tender, non-distended, with normoactive bowel sounds.  Ext: Warm, dry Skin: No new rashes, lesions or ulcers on visualized skin. Neuro: Alert, not interactive or cooperative with exam today. Psych: Calm, otherwise, UTD.  Data Reviewed: I have personally reviewed following labs and imaging studies  CBC: Recent Labs  Lab 02/11/21 0936  WBC 5.1  NEUTROABS 3.3  HGB 10.9*  HCT 33.5*  MCV 77.0*  PLT 034   Basic Metabolic Panel: Recent Labs  Lab 02/11/21 0936  NA 138  K 3.5  CL 98  CO2 30  GLUCOSE 133*  BUN 14  CREATININE 0.60  CALCIUM 9.3   GFR: Estimated Creatinine Clearance: 73.2 mL/min (by C-G formula based on SCr of 0.6 mg/dL). Liver Function Tests: Recent Labs  Lab 02/11/21 0936  AST 24  ALT 31  ALKPHOS 75  BILITOT 0.4  PROT 6.9  ALBUMIN 3.6   No results for input(s): LIPASE, AMYLASE in the last 168 hours. No results for input(s): AMMONIA in the last 168 hours. Coagulation Profile: No results for input(s): INR, PROTIME in the last 168 hours. Cardiac Enzymes: No results for input(s): CKTOTAL, CKMB, CKMBINDEX, TROPONINI in the last 168 hours. BNP (last 3 results) No results for input(s): PROBNP in the last 8760 hours. HbA1C: No results for input(s): HGBA1C in the last 72 hours. CBG: Recent Labs  Lab 02/09/21 1933 02/09/21 2309 02/10/21 0420 02/11/21 2138 02/12/21 2013  GLUCAP 123* 122* 164* 128* 103*   Lipid Profile: No results for input(s): CHOL, HDL, LDLCALC, TRIG, CHOLHDL,  LDLDIRECT in the last 72 hours. Thyroid Function Tests: No results for input(s): TSH, T4TOTAL, FREET4, T3FREE, THYROIDAB in the last 72 hours. Anemia Panel: No results for input(s): VITAMINB12, FOLATE, FERRITIN, TIBC, IRON, RETICCTPCT in the last 72 hours. Urine analysis:    Component Value Date/Time   COLORURINE YELLOW 02/11/2021 1411   APPEARANCEUR HAZY (A) 02/11/2021 1411   LABSPEC 1.010 02/11/2021 1411   PHURINE 7.0 02/11/2021 1411   GLUCOSEU NEGATIVE 02/11/2021 1411   HGBUR NEGATIVE 02/11/2021 1411   BILIRUBINUR NEGATIVE 02/11/2021 1411   KETONESUR NEGATIVE 02/11/2021 1411   PROTEINUR NEGATIVE 02/11/2021 1411   UROBILINOGEN 0.2 02/21/2014 1629   NITRITE NEGATIVE 02/11/2021 1411   LEUKOCYTESUR NEGATIVE 02/11/2021 1411   Recent Results (from the past 240 hour(s))  Urine Culture     Status: Abnormal   Collection Time: 02/11/21  9:25 AM   Specimen: Urine, Catheterized  Result Value Ref Range Status   Specimen Description URINE, CATHETERIZED  Final   Special Requests   Final    NONE Performed at [facility], [place] [address], [place], [place] [address]    Culture >=100,000 COLONIES/mL ENTEROBACTER CLOACAE (A)  Final   Report Status  02/13/2021 FINAL  Final   Organism ID, Bacteria ENTEROBACTER CLOACAE (A)  Final      Susceptibility   Enterobacter cloacae - MIC*    CEFAZOLIN >=64 RESISTANT Resistant     CEFEPIME >=32 RESISTANT Resistant     CIPROFLOXACIN 1 SENSITIVE Sensitive     GENTAMICIN <=1 SENSITIVE Sensitive     IMIPENEM 0.5 SENSITIVE Sensitive     NITROFURANTOIN 32 SENSITIVE Sensitive     TRIMETH/SULFA >=320 RESISTANT Resistant     PIP/TAZO 16 SENSITIVE Sensitive     * >=100,000 COLONIES/mL ENTEROBACTER CLOACAE      Radiology Studies: No results found.  Scheduled Meds:  acetaminophen  650 mg Per Tube Q6H   amLODipine  10 mg Per Tube Daily   atorvastatin  40 mg Per Tube Daily   baclofen  5 mg Per Tube 3 times per day   carvedilol  25 mg Per Tube BID WC    celecoxib  200 mg Per Tube BID   chlorhexidine gluconate (MEDLINE KIT)  15 mL Mouth Rinse BID   Chlorhexidine Gluconate Cloth  6 each Topical Daily   diclofenac Sodium  4 g Topical QID   doxazosin  2 mg Per Tube Daily   feeding supplement  237 mL Oral BID BM   feeding supplement (JEVITY 1.5 CAL/FIBER)  900 mL Per Tube Q24H   free water  200 mL Per Tube Q4H   hydrALAZINE  37.5 mg Per Tube Q8H   mouth rinse  15 mL Mouth Rinse 5 X Daily   modafinil  200 mg Per Tube Daily   multivitamin with minerals  1 tablet Per Tube Daily   pantoprazole sodium  40 mg Per Tube Daily   Continuous Infusions:  sodium chloride Stopped (10/26/20 1445)     LOS: 171 days   Time spent: 25 minutes.  Patrecia Pour, MD [facility] [email or web address] 02/14/2021, 9:53 AM

## 2021-02-14 NOTE — Progress Notes (Signed)
Charlene [name] is to be changed every 12 weeks per Anders Simmonds.

## 2021-02-14 NOTE — Plan of Care (Signed)
  Problem: Coping: Goal: Will identify appropriate support needs Outcome: Progressing   Problem: Health Behavior/Discharge Planning: Goal: Ability to manage health-related needs will improve Outcome: Progressing   Problem: Self-Care: Goal: Verbalization of feelings and concerns over difficulty with self-care will improve Outcome: Progressing Goal: Ability to communicate needs accurately will improve Outcome: Progressing   Problem: Nutrition: Goal: Risk of aspiration will decrease Outcome: Progressing   Problem: Intracerebral Hemorrhage Tissue Perfusion: Goal: Complications of Intracerebral Hemorrhage will be minimized Outcome: Progressing   Problem: Education: Goal: Knowledge of General Education information will improve Description: Including pain rating scale, medication(s)/side effects and non-pharmacologic comfort measures Outcome: Progressing   Problem: Health Behavior/Discharge Planning: Goal: Ability to manage health-related needs will improve Outcome: Progressing   Problem: Clinical Measurements: Goal: Ability to maintain clinical measurements within normal limits will improve Outcome: Progressing Goal: Will remain free from infection Outcome: Progressing Goal: Diagnostic test results will improve Outcome: Progressing Goal: Respiratory complications will improve Outcome: Progressing Goal: Cardiovascular complication will be avoided Outcome: Progressing   Problem: Coping: Goal: Level of anxiety will decrease Outcome: Progressing   Problem: Elimination: Goal: Will not experience complications related to bowel motility Outcome: Progressing Goal: Will not experience complications related to urinary retention Outcome: Progressing   Problem: Pain Managment: Goal: General experience of comfort will improve Outcome: Progressing   Problem: Skin Integrity: Goal: Risk for impaired skin integrity will decrease Outcome: Progressing   Problem: Intracerebral  Hemorrhage Tissue Perfusion: Goal: Complications of Intracerebral Hemorrhage will be minimized Outcome: Progressing   Problem: Ischemic Stroke/TIA Tissue Perfusion: Goal: Complications of ischemic stroke/TIA will be minimized Outcome: Progressing   

## 2021-02-15 LAB — GLUCOSE, CAPILLARY
Glucose-Capillary: 111 mg/dL — ABNORMAL HIGH (ref 70–99)
Glucose-Capillary: 127 mg/dL — ABNORMAL HIGH (ref 70–99)
Glucose-Capillary: 137 mg/dL — ABNORMAL HIGH (ref 70–99)
Glucose-Capillary: 140 mg/dL — ABNORMAL HIGH (ref 70–99)
Glucose-Capillary: 109 mg/dL — ABNORMAL HIGH (ref 70–99)
Glucose-Capillary: 118 mg/dL — ABNORMAL HIGH (ref 70–99)
Glucose-Capillary: 151 mg/dL — ABNORMAL HIGH (ref 70–99)

## 2021-02-15 NOTE — Progress Notes (Signed)
   NAME:  Charlene [name], MRN:  [id number], DOB:  1966/01/31, LOS: 172 ADMISSION DATE:  08/26/2020, CONSULTATION DATE:  2/10 REFERRING MD:  Roda Shutters, CHIEF COMPLAINT:  headache   History of Present Illness:  55 year old female admitted with hypertensive R thalamic/basal ganglia ICH. Resultant obstructive hydrocephalus prompting EVD placement 2/10. Subsequently, 2/10 she had acute mental status change to obtundation and respiratory arrest resulting in intubation. Trach and Peg 2/24. Trach change to #6 cuffless on 3/11.   Pertinent  Medical History  Hypertension, Medication non-compliance  Significant Hospital Events: Including procedures, antibiotic start and stop dates in addition to other pertinent events   2/09 Admit for ICH 2/10 EVD placed, later intubated due to obtundation and respiratory arrest 2/11 MRI Brain, hypertonic saline d/c'd, EVD not working.  Husband did not consent to PICC  2/13 EVD removed 2/17 GOC discussion with family, PMT 2/24 tracheostomy placed by Dr. Denese Killings, PEG placed by Dr. Sheliah Hatch 3/11 trach changed to Shiley 6 uncuffed 3/21 sputum culture with Serratia resistant to Ancef 4/18 28% ATC / 5L  5/08 downsized trach to 4 7/11 No significant secretions / no issues with trach 7/21 Self de-cannulated, trach replaced 7/25 ATC 21%/5L. Using PSV, some speech per SLP, eating 8/1 tolerating PMV, minimal secretions  Interim History / Subjective:  No major issues in the past couple of days. Discussed with RT at bedside,  mother at bedside, chart reviewed. Wearing her PMV well without issues. She is awake and tracks. Nonverbal for me, but notes indicate she sometimes attempts speech. Has had capacity evaluation by psychiatry - not able to make decisions regarding placement due to not understanding language  Objective   Blood pressure (!) 161/99, pulse 90, temperature 98.4 F (36.9 C), temperature source Oral, resp. rate 16, height 5\' 5"  (1.651 m), weight 62.4 kg, last  menstrual period 03/03/2014, SpO2 100 %, unknown if currently breastfeeding.    FiO2 (%):  [21 %] 21 %   Intake/Output Summary (Last 24 hours) at 02/15/2021 1155 Last data filed at 02/15/2021 0125 Gross per 24 hour  Intake --  Output 600 ml  Net -600 ml   Filed Weights   02/13/21 0417 02/14/21 0500 02/15/21 0407  Weight: 60.9 kg 62.7 kg 62.4 kg    Examination: General: chronically ill, no distress  HEENT: MM pink/moist, anicteric, trach midline c/d/i Neuro: eyes open, spontaneous movement of right arm noted, left arm with preventative soft brace in place, left sided neglect, tracks with eyes CV: s1s2 RRR, no m/r/g PULM: non-labored,4.0 shiley cuffless trach GI: soft, bsx4 active  Extremities: warm/dry, contracture of left hand noted  Skin: no rashes or lesions  Resolved Hospital Problem list     Assessment & Plan:   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 -trach care per protocol  -not a candidate for decannulation due to deconditioning / bed bound status and poor cough mechanics  -ATC for moisture  -continue SLP efforts, PMV, diet with aspiration precautions + TF (poor intake) -PT / OT efforts  - xray if clinical change   PCCM will follow up again in week of 02/22/21 - call if assistance is needed sooner.  04/24/21 [facility] 02/15/2021 12:04 PM  Pager: see AMION  If no response to pager , please call critical care on call (see AMION) until 7pm After 7:00 pm call Elink

## 2021-02-15 NOTE — Progress Notes (Signed)
[facility] PROGRESS NOTE  BARRIE SIGMUND WLN:[id number] DOB: [birth year] DOA: 08/26/2020 PCP: Patient, [name] (Inactive)    October 08, 2020     10/16/2020                        10/16/2020     10/18/2020                        10/20/2020      12/30/2020                   12/30/2020   01/04/2021   Status: Remains inpatient appropriate because:Altered mental status and Unsafe d/c plan: Requirement to discharge to trach capable SNF   Dispo: The patient is from: Home              Anticipated d/c is to: SNF trach capable-[place]-we have recently discovered that the [facility] may have a trach bed available in clinicals have been sent to the facility for review -trach team re eval 7/25 and rec NOT to decannulate 2/2 and lack of appropriate voluntary and involuntary cough response              Patient currently is medically stable to d/c.   Difficult to place patient Yes   Level of care: Telemetry Medical  Code Status: Full Family Communication: [name] [phone]) is her POA and legal contact.  [name] at bedside on 7/26.  Also updated daughter [name] by telephone on 7/26 DVT prophylaxis: Chronically bedbound.  5/6: We will discontinue prophylactic Lovenox   Vaccination status: Unvaccinated-husband still undecided.  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  Subsequently on 2/10, she developed obtundation and respiratory arrest leading to intubation.  She has remained persistently obtunded, now has a trach and a PEG.  Hospital course complicated by persistent fever, Serratia pneumonia and now pre-orbital cellulitis  As of 3/25 with adjustments in hypertonicity medications patient began to improve regarding hypertonicity.  Eventually started on provigil with improvements in alertness.  Evaluated by rehab physician who recommended increasing provigil dose further.  Subsequently patient has become more alert.  Most days she is able to track and with encouragement able to follow simple commands especially since partial obstruction glasses placed.  See progress note dictated 4/27 for expanded details.   2/09 Admit for [place] 2/10 EVD placed, later intubated due to obtundation and respiratory arrest 2/11 MRI Brain, hypertonic saline d/c'd, EVD not working.  Husband did not consent to PICC  2/13 EVD removed 2/17 [place] discussion with family, PMT 2/24 Bedside trach and PEG 3/1 Transferred to [facility]  Subjective: Awake and alert today.  Tracking with eyes and listening intently to conversation I am having with her mother.  Mother concerned that patient intermittently has  been placing hand to temple.  Asked patient if she had been having headaches and she stated no.  Objective: Vitals:   02/15/21 0400 02/15/21 0739  BP: 123/76   Pulse: (!) 102 96  Resp: 17 13  Temp:    SpO2: 99% 100%    Intake/Output Summary (Last 24 hours) at 02/15/2021 0820 Last data filed at 02/15/2021 0125 Gross per 24 hour  Intake --  Output 600 ml  Net -600 ml       Filed Weights   02/13/21 0417 02/14/21 0500 02/15/21 0407  Weight: 60.9 kg 62.7 kg 62.4 kg    Exam:       Gen: Alert and in no acute distress Respiratory: #4.0 cuffless trach, trach collar with FiO2 21%  at 5L-clear.  No congestion from trach. Cardiovascular: Normal heart sounds, normotensive, edema. Abdomen:  PEG /nocturnal tube feedings. LBM 7/30-normoactive bowel sounds.  Calorie count in progress regarding oral intake Neurological: Less left facial droop resolved.  Appears to have hearing loss.  Moves right upper extremity purposefully strength 4/5. RLE strength unable to be assessed, noted to move purposely.  Has left-side neglect.   Psychiatric: Alert, interactive, follows simple commands.  Assessment/Plan: Acute problems: Multiple acute strokes  Right Thalamus ICH with IVH  Obstructive Hydrocephalus/left-sided neglect /Non TBI related hypertonicity -On 7/26 due to daytime sleepiness baclofen decreased from 10 mg to 5 mg and nighttime tizanidine discontinued with significant improvement and patient alertness.  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 -Previously given 2 days of IV Rocephin and since no signs of sepsis was given a one-time dose of fosfomycin over the weekend -Previously had recommended using pure wick only at night but since she continues to develop UTI despite this change have requested that purewick be completely discontinued.  Chronic abnormal EKG/telemetry  -Has chronic ST segment abnormalities wax and wane on telemetry -Recent echo 1 remark -Has had 2 cycles of serial troponins that have been negative -Follow-up EKG is also negative -Indications to continue cardiac monitoring therefore discontinued.  Tracheostomy dependent -Primary barrier to decannulation is poor/very weak cough reflex -Trach team has had several discussions with patient's husband outlining current limitations for decannulation and uncertainty regarding this  would even be possible in the future -At this time is not a candidate for decannulation -7/21 patient self decannulated, trach replaced easily by RT -7/27 RT performed oral tracheal suction without patient demonstrating cough reflex or gag reflex.  Recurrent right knee pain/known ligamentous injury and chronic osteoarthritis -s/p injection of Marcaine and Depomedrol 6/30 -Continue scheduled Celebrex, Tylenol and Ultram  Hypertensive emergency/Malignant HTN -Continue Norvasc, beta-blocker and hydralazine.  Dysphagia -Continue dysphagia 1 with thin liquids -Inconsistent oral intake therefore has been resumed on nocturnal tube feedings  History of urinary retention -Continue Cardura    Other problems: Right upper extremity thrombophlebitis - [name] -> age indeterminate SVT of right cephalic vein  Neck Contracture/torticollis -- Resolved  ESBL positive Enterobacter UTI -Has completed 5 days of meropenem -Contact isolation during hospitalization -continue Oxybutynin suspected bladder spasms  Klebsiella UTI -Resistant to nitrofurantoin and ampicillin -To minimize risk of recurrent UTI have asked that pure wick only be used at bedtime on a 10 PM to 8 AM schedule  Recurrent diarrhea -Resolved  Bacterial Conjunctivitis /periorbital cellulitis -Resolved  Chronic pansinusitis/recurrent fevers 2/2 aspiration PNA;History of Serratia  Pneumonia  Hospital Acquired Pneumonia:  -Completed several courses of antibiotics -Follow-up imaging including sinus CT consistent with chronic but stable and resolving sinusitis   Anemia -Stable  HLD -Continue Lipitor      Data Reviewed: Basic Metabolic Panel: Recent Labs  Lab 02/11/21 0936  NA 138  K 3.5  CL 98  CO2 30  GLUCOSE 133*  BUN 14  CREATININE 0.60  CALCIUM 9.3     Liver Function Tests: Recent Labs  Lab 02/11/21 0936  AST 24  ALT 31  ALKPHOS 75  BILITOT 0.4  PROT 6.9  ALBUMIN 3.6      CBC: Recent Labs   Lab 02/11/21 0936  WBC 5.1  NEUTROABS 3.3  HGB 10.9*  HCT 33.5*  MCV 77.0*  PLT 282      CBG: Recent Labs  Lab 02/09/21 1933 02/09/21 2309 02/10/21 0420 02/11/21 2138 02/12/21 2013  GLUCAP 123* 122* 164* 128* 103*     Studies: No results found.   Scheduled Meds:  acetaminophen  650 mg Per Tube Q6H   amLODipine  10 mg Per Tube Daily   atorvastatin  40 mg Per Tube Daily   baclofen  5 mg Per Tube 3 times per day   carvedilol  25 mg Per Tube BID WC   celecoxib  200 mg Per Tube BID   chlorhexidine gluconate (MEDLINE KIT)  15 mL Mouth Rinse BID   Chlorhexidine Gluconate Cloth  6 each Topical Daily   diclofenac Sodium  4 g Topical QID   doxazosin  2 mg Per Tube Daily   feeding supplement  237 mL Oral BID BM   feeding supplement (JEVITY 1.5 CAL/FIBER)  900 mL Per Tube Q24H   free water  200 mL Per Tube Q4H   hydrALAZINE  37.5 mg Per Tube Q8H   mouth rinse  15 mL Mouth Rinse 5 X Daily   modafinil  200 mg Per Tube Daily   multivitamin with minerals  1 tablet Per Tube Daily   pantoprazole sodium  40 mg Per Tube Daily   Continuous Infusions:  sodium chloride Stopped (10/26/20 1445)    Principal Problem:   ICH (intracerebral hemorrhage) ([place]) Active Problems:   Acute hypoxemic respiratory failure (HCC)   Tracheostomy dependent ([place])   Palliative care by specialist   Muscle hypertonicity   Oropharyngeal dysphagia   Poor prognosis   Aspiration pneumonia of right lower lobe (HCC)   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  Palliative care.   PCCM continues to follow.  No decannulation.   Procedures: Tracheostomy PEG tube EEG Echocardiogram  Antibiotics: Vancomycin 2/12-2/15 Maxipime 2/12-2/15 Ancef  2/19-2/27 Vancomycin 2/20 x 1 dose Maxipime 2/27- 3/5 Vancomycin 3/1-3/3 Ancef 3/6-3/14 Cipro 3/20 4-3/30 Unasyn 3/30-4/4 Rocephin 4/11-4/12 Meropenem 4/12-4/17   Time spent: 15 minutes    Erin Hearing ANP  [facility] 7 am - 330 pm/M-F for direct patient care and secure chat Please refer to [place] for contact info 172  Days

## 2021-02-15 NOTE — Progress Notes (Signed)
Calorie Count Note  72 hour calorie count ordered.  Diet: dysphagia 1 with thin liquids Supplements: Ensure Enlive BID  Day 1 Results (02/12/21) Breakfast: 200 kcals, 6 grams protein Lunch: 509 kcals, 34 grams protein Dinner: 296 kcals, 18 grams protein Supplements: 0 kcals, 0 grams protein  Total intake: 1005 kcal (63% of minimum estimated needs)  58 grams protein (72% of minimum estimated needs)  Day 2 Results (02/13/21) Breakfast: 0 kcals, 0 grams protein Lunch: 0 kcals, 0 grams protein Dinner: 132 kcals, 12 grams protein Supplements: 0 kcals, 0 grams protein  Total intake: 132 kcal (8% of minimum estimated needs)  12 grams protein (15% of minimum estimated needs)  Day 3 Results (02/14/21) No meal/supplement documentation provided by RN staff  Per the [facility], pt has been accepting Ensure Enlive/Plus PO BID; however, no documentation on specific amount of supplement consumed at each time given was provided by RN staff, so RD unable to determine if Ensure is contributing to calorie/protein intake or not.  Given pt has had continued varied/inadequate PO intake, recommend continuing nocturnal TF regimen at this time and continuing to monitor adequacy of pt's po intake via meal completions in chart.   Nutrition Dx: Inadequate oral intake related to inability to eat as evidenced by NPO status. -- progressing, pt now on dysphagia 1 diet with thin liquids   Goal: Patient will meet greater than or equal to 90% of their needs -- progressing  Intervention:  -d/c calorie count -Continue Ensure Enlive/Plus BID -Continue nocturnal TF regimen via PEG:  Jevity 1.5 @ 91ml/hr for 12 hours from 2000-0800 ( total) Nocturnal TF regimen will provide 1350 kcals, 57 grams protein, free water (Meets ~84% and ~72% minimum estimated calorie and protein needs, respectively)   -continue free water flushes Q4H (provides total free water with flushes and TF)  Eugene Gavia, MS,  RD, LDN (she/her/hers) RD pager number and weekend/on-call pager number located in Amion.

## 2021-02-16 ENCOUNTER — Inpatient Hospital Stay (HOSPITAL_COMMUNITY): Payer: Medicaid Other

## 2021-02-16 HISTORY — PX: IR REPLC GASTRO/COLONIC TUBE PERCUT W/FLUORO: IMG2333

## 2021-02-16 IMAGING — XA IR REPLACE G-TUBE/COLONIC TUBE
3 series · 10 of 10 positions shown · non-contrast
Comparison: None.

INDICATION: Dislodged gastrostomy tube. Chronic dysphagia and protein calorie
malnutrition post CVA. Request replacement.

EXAM:
FLUOROSCOPIC GUIDED REPLACEMENT OF GASTROSTOMY TUBE

[Series 1: fl neuro · 4 of 76 frames shown (1 of 2)]
[frame 12/76]
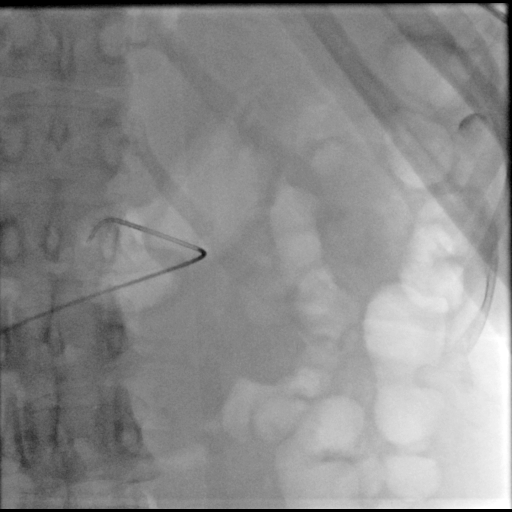
[frame 28/76]
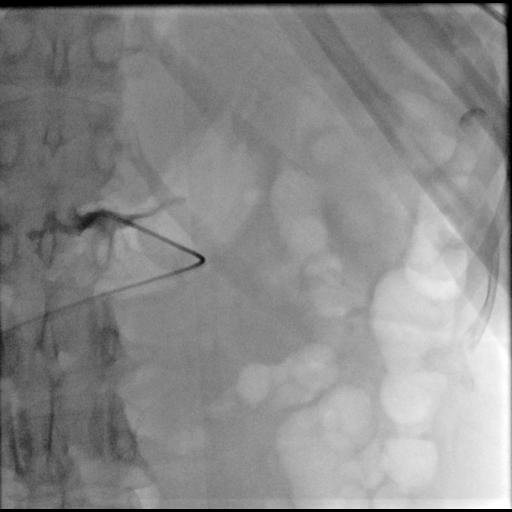
[frame 39/76]
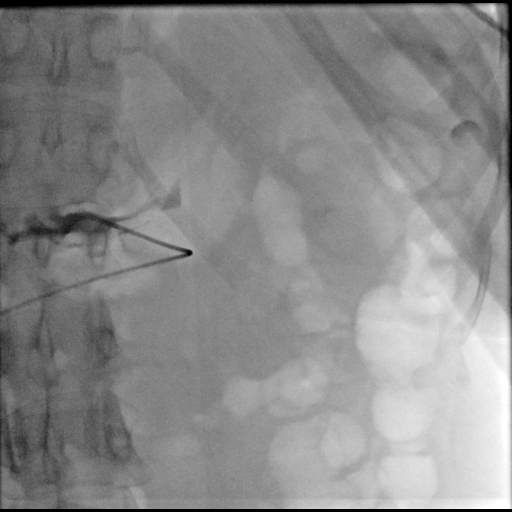
[frame 65/76]
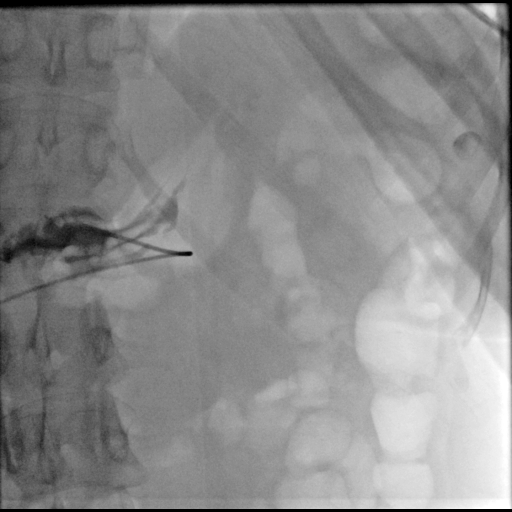

[Series 2: fl neuro · 4 of 29 frames shown (2 of 2)]
[frame 1/29]
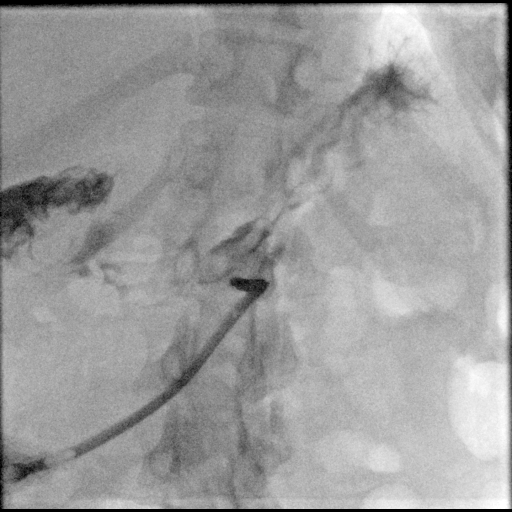
[frame 5/29]
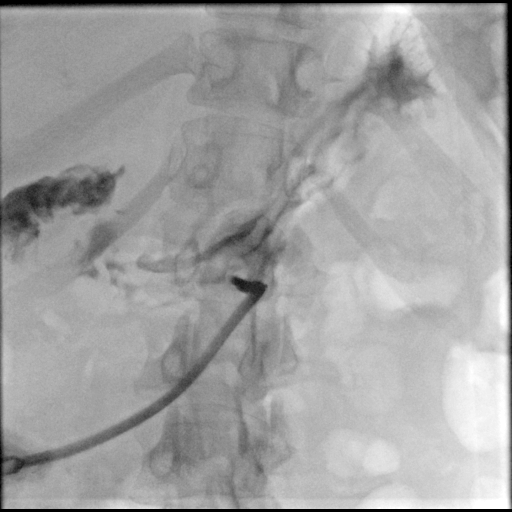
[frame 15/29]
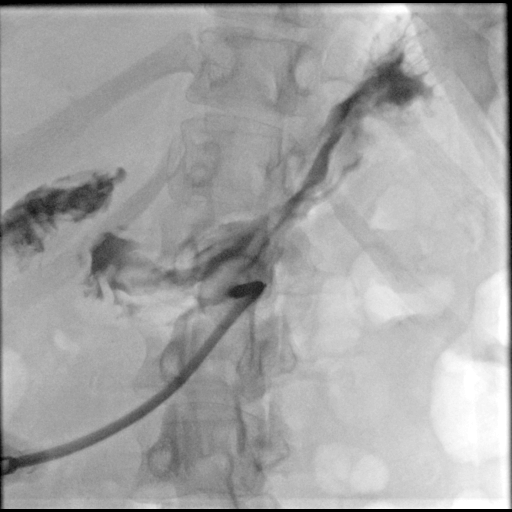
[frame 25/29]
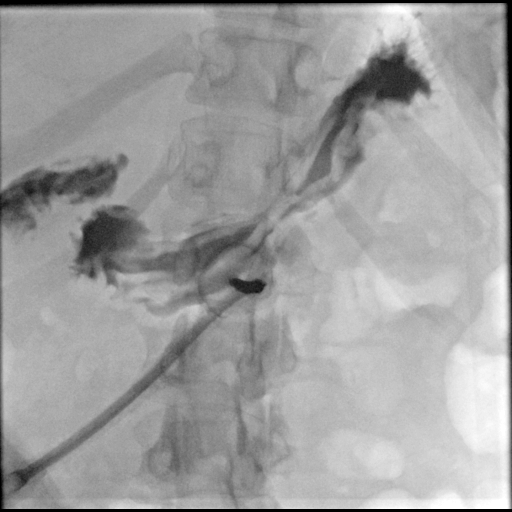

[Series 300: ir replc gastro/colonic tube percut w/fl · 2 of 2 slices shown]
[im 1/2]
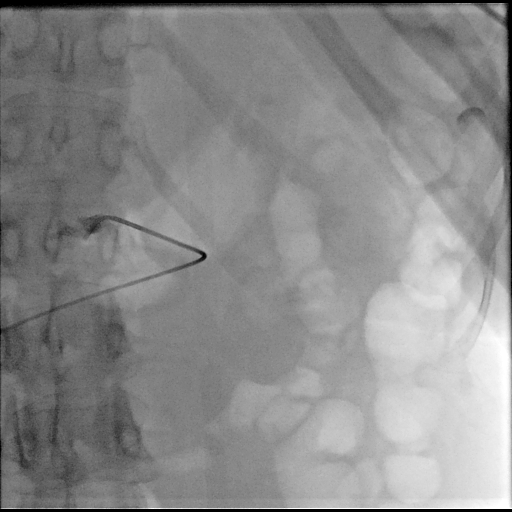
[im 2/2]
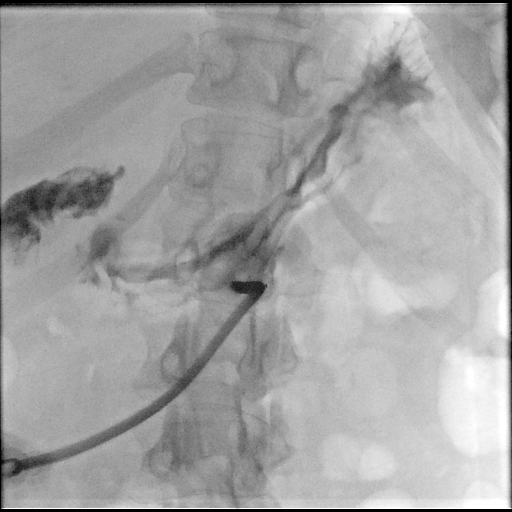

[10 of 10 positions shown; findings below may reference images not displayed]

MEDICATIONS:
None.

CONTRAST:  15mL OMNIPAQUE IOHEXOL 300 MG/ML SOLN - administered into
the gastric lumen

FLUOROSCOPY TIME:  12 seconds

COMPLICATIONS:
None immediate.

PROCEDURE:
Informed written consent was obtained from the patient after a
discussion of the risks, benefits and alternatives to treatment.
Questions regarding the procedure were encouraged and answered. A
timeout was performed prior to the initiation of the procedure.

The upper abdomen and external portion of the existing gastrostomy
tube was prepped and draped in the usual sterile fashion, and a
sterile drape was applied covering the operative field. Maximum
barrier sterile technique with sterile gowns and gloves were used
for the procedure. A timeout was performed prior to the initiation
of the procedure.

The existing gastrostomy tract was identified in the left upper
quadrant. A Kumpe the catheter was advanced into the gastric lumen.
A spot film injection confirms intragastric location. Next, the
Kumpe the catheter was cannulated with a stiff Glidewire which was
coiled within the gastric fundus. A 20 Fr dilator was used to dilate
the tract. Over the stiff guide wire, a new 18-French balloon
inflatable gastrostomy tube was inserted. The balloon was inflated
with saline and dilute contrast and pulled against the anterior
inner lumen of the stomach and the external disc was cinched.

Contrast was injected and a post procedural spot fluoroscopic image
was obtained confirming appropriate positioning and functionality of
the new gastrostomy tube. A dressing was applied. The patient
tolerated the procedure well without immediate postprocedural
complication.
IMPRESSION: Successful fluoroscopic guided replacement of a new 18-French
gastrostomy tube. The gastrostomy tube is ready for immediate use.

## 2021-02-16 MED ORDER — IOHEXOL 300 MG/ML  SOLN
50.0000 mL | Freq: Once | INTRAMUSCULAR | Status: AC | PRN
  Administered 2021-02-16: 8 mL

## 2021-02-16 MED ORDER — ENSURE ENLIVE PO LIQD
237.0000 mL | Freq: Three times a day (TID) | ORAL | Status: DC
  Administered 2021-02-16 – 2021-04-07 (×126): 237 mL via ORAL
  Filled 2021-02-16: qty 237

## 2021-02-16 MED ORDER — LIDOCAINE VISCOUS HCL 2 % MT SOLN
OROMUCOSAL | Status: AC
  Filled 2021-02-16: qty 15

## 2021-02-16 NOTE — Plan of Care (Signed)
  Problem: Coping: Goal: Will identify appropriate support needs Outcome: Progressing   Problem: Health Behavior/Discharge Planning: Goal: Ability to manage health-related needs will improve Outcome: Progressing   Problem: Self-Care: Goal: Verbalization of feelings and concerns over difficulty with self-care will improve Outcome: Progressing Goal: Ability to communicate needs accurately will improve Outcome: Progressing   Problem: Nutrition: Goal: Risk of aspiration will decrease Outcome: Progressing   Problem: Intracerebral Hemorrhage Tissue Perfusion: Goal: Complications of Intracerebral Hemorrhage will be minimized Outcome: Progressing   Problem: Education: Goal: Knowledge of General Education information will improve Description: Including pain rating scale, medication(s)/side effects and non-pharmacologic comfort measures Outcome: Progressing   Problem: Health Behavior/Discharge Planning: Goal: Ability to manage health-related needs will improve Outcome: Progressing   Problem: Clinical Measurements: Goal: Ability to maintain clinical measurements within normal limits will improve Outcome: Progressing Goal: Will remain free from infection Outcome: Progressing Goal: Diagnostic test results will improve Outcome: Progressing Goal: Respiratory complications will improve Outcome: Progressing Goal: Cardiovascular complication will be avoided Outcome: Progressing   Problem: Coping: Goal: Level of anxiety will decrease Outcome: Progressing   Problem: Elimination: Goal: Will not experience complications related to bowel motility Outcome: Progressing Goal: Will not experience complications related to urinary retention Outcome: Progressing   Problem: Pain Managment: Goal: General experience of comfort will improve Outcome: Progressing   Problem: Skin Integrity: Goal: Risk for impaired skin integrity will decrease Outcome: Progressing   Problem: Intracerebral  Hemorrhage Tissue Perfusion: Goal: Complications of Intracerebral Hemorrhage will be minimized Outcome: Progressing   Problem: Ischemic Stroke/TIA Tissue Perfusion: Goal: Complications of ischemic stroke/TIA will be minimized Outcome: Progressing   

## 2021-02-16 NOTE — Progress Notes (Signed)
PT Cancellation Note  Patient Details Name: MARLEAH BEEVER MRN: [id number] DOB: [birth year]   Cancelled Treatment:    Reason Eval/Treat Not Completed: Patient at procedure or test/unavailable.  Transporter present to take pt to IR.  RN reports Darnita pulled out her PEG.  PT to check back tomorrow.   Thanks,  Corinna Capra, PT, DPT  Acute Rehabilitation Ortho Tech Supervisor [phone] pager #[phone] office      Lurena Joiner B Ceirra Belli 02/16/2021, 1:54 PM

## 2021-02-16 NOTE — Progress Notes (Signed)
Patient removed PEG tube. Charge and MD made aware. IR visited in room and were unsuccessful at bedside replacement. Procedure scheduled and patient taken down via transport at 1450. Will update when patient returns

## 2021-02-16 NOTE — Procedures (Signed)
Successful image guided replacement of dislodged gastrostomy tube. New 18 Fr balloon retention G-tube in place. No complications. See procedure note for details.  Brayton El PA-C Interventional Radiology 02/16/2021 3:36 PM

## 2021-02-16 NOTE — Progress Notes (Signed)
Nutrition Follow-up  DOCUMENTATION CODES:   Not applicable  INTERVENTION:  -Staff to encourage PO intake & assist as needed -Increase to Ensure Enlive po TID, each supplement provides 350 kcal and 20 grams of protein -Continue MVI with minerals daily  Once PEG is replaced and ready for use, resume nocturnal TF regimen as follows: -Jevity 1.5 @ 61ml/hr for 12 hours from 2000-0800 ( total) Provides 1350 kcals, 57 grams protein, free water -- meets ~84% and 72% minimum estimated calorie and protein needs, respectively -Free water flushes per MD, currently Q4H (provides total free water w/ TF and flushes)  NUTRITION DIAGNOSIS:   Inadequate oral intake related to inability to eat as evidenced by NPO status. 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  Subsequently on 2/10, pt developed obtundation and respiratory arrest leading to intubation. Pt is now s/p trach/PEG placement. Hospital course complicated by Serratia pneumonia, ESBL Enterobacter UTI and pre-orbital cellulitis.  2/10 s/p EVD placement and intubation for respiratory arrest 2/13 EVD removed 2/17 palliative care consult; family requesting tx to [facility] 2/24 s/p trach and PEG 3/1 tx to [facility] 3/11 trach changed to shiley #6 cuffless 4/08 pt w/ sepsis 2/2 UTI 4/18 28% ATC/5L 5/2 CIR reevaluated pt and determined she has not made enough improvement for admit; recommended SNF 5/8 trach changed to shiley #4 cuffless 5/23 CCM evaluated and determined pt stilll unable to decannulate due to variable mental  status 6/3 trach exchanged, still shiley #4 cuffless 6/30 s/p R knee aspiration and injection 7/3 trach exchanged, still shiley #4 cuffless 7/8 diet advanced to dysphagia 1 with thin liquids 7/17 trach exchanged, still shiley #4 cuffless 7/21 trach replaced after pt self-decannulated  8/02 PEG replaced after pt removed  CCM evaluated pt yesterday, noted pt tolerated PMV with minimal secretions though still not candidate for decannulation  Pt pulled out PEG tube today per RN and IR unsuccessful at replacing tube at bedside. Pt currently in IR for replacement.  No additional PO documentation since RD note yesterday, 8/1. Will increase oral nutrition supplement to TID (Ensure Enlive/Plus currently BID) as IR typically recommends holding off on initiating TF for 24 hours after placement. Discussed documenting meals in Epic with RN; hopeful being without TF will stimulate pt's appetite.   Admission weight: 61.2 kg Current weight: 61.6 kg  UOP: 2x unmeasured occurrences x24 hours Stool: 2x unmeasured occurrences x24 hours  Medications: mvi with minerals, protonix Labs reviewed.  Current TF: Jevity 1.5 @ 82ml/hr administered over 12 hours (2000-0800) to provide TF per night, free water Q4H  Diet Order:   Diet Order             DIET - DYS 1 Room service appropriate? No; Fluid consistency: Thin  Diet effective now                   EDUCATION NEEDS:   No education needs have been identified at this time  Skin:  Skin Assessment: Reviewed RN Assessment  Last BM:  8/1 type 7  Height:   Ht Readings from Last 1 Encounters:  10/18/20 5\' 5"  (1.651 m)    Weight:   Wt Readings from Last 1 Encounters:  02/16/21 61.6 kg    Ideal Body Weight:  56.8 kg  BMI:  Body mass index is 22.6 kg/m.  Estimated Nutritional Needs:   Kcal:  1600-1800  Protein:  80-90 grams  Fluid:  >1.6L/d    04/18/21, MS, RD, LDN (she/her/hers) RD pager number and  weekend/on-call pager number located in Amion.

## 2021-02-16 NOTE — Progress Notes (Signed)
  Speech Language Pathology Treatment: Charlene [name] Speaking valve;Cognitive-Linquistic  Patient Details Name: Charlene [name] MRN: [id number] DOB: May 28, 1966 Today's Date: 02/16/2021 Time: 0350-0938 SLP Time Calculation (min) (ACUTE ONLY): 35 min  Assessment / Plan / Recommendation Clinical Impression  Charlene [name] was sleepy but awakened with prompts. Husband, Charlene [name], at bedside. PMV placed with excellent toleration c/w baseline.  She followed simple commands with 60% accuracy when context was unpredictable.  She spoke and sang along with Charlene [name] when he sang to her with max cues to increase volume - speech is generally quite hypophonic.  She produced several intelligible words today when repeating song lyrics.    NPO today for replacement of PEG in IR.   SLP will continue to follow.    HPI HPI: Pt is 54 y/o female presents to [facility] on 2/9 with L sided weakness and headaches. PMH includes anxiety, HTN. 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 S/p Right frontal ventricular catheter placement on 2/10, stopped functioning on 2/11.  MRI 2/11 shows right greater than left mesial temporal lobes / parahippocampal cortex, bilateral basal ganglia, and possibly the upper pons that are concerning for acute infarcts, most likely secondary to mass effect from the hemorrhage. ETT 2/10; trach/PEG 09/10/20.  Trach changed to #6 cuffless 3/11; #4 cuffless 5/4. Has had two MBS studies, the last on 7/8 - pt started on dysphagia 1, thin liquids. Trach replaced following self-decannulation 7/21.      SLP Plan    Continue SLP for swallowing, speech, cognition.      Recommendations   Resume Dysphagia 1/thin liquids after PEG                        GO              Charlene Frayne L. Samson [name], MA CCC/SLP Acute Rehabilitation Services Office number [phone] Pager  [phone]   Charlene [name] 02/16/2021, 4:30 PM

## 2021-02-16 NOTE — Progress Notes (Signed)
[facility] PROGRESS NOTE  Charlene [name] TDV:[id number] DOB: 1965-12-04 DOA: 08/26/2020 PCP: Patient, [name] (Inactive)    October 08, 2020     10/16/2020                        10/16/2020     10/18/2020                        10/20/2020      12/30/2020                   12/30/2020   01/04/2021   Status: Remains inpatient appropriate because:Altered mental status and Unsafe d/c plan: Requirement to discharge to trach capable SNF   Dispo: The patient is from: Home              Anticipated d/c is to: SNF trach capable-[place]-we have recently discovered that the [facility] may have a trach bed available in clinicals have been sent to the facility for review -trach team re eval 7/25 and rec NOT to decannulate 2/2 and lack of appropriate voluntary and involuntary cough response              Patient currently is medically stable to d/c.   Difficult to place patient Yes   Level of care: Telemetry Medical  Code Status: Full Family Communication: husband Charlene [name] [phone]) is her POA and legal contact.  [name] at bedside on 7/26.  Also updated daughter Charlene [name] by telephone on 7/26 DVT prophylaxis: Chronically bedbound.  5/6: We will discontinue prophylactic Lovenox   Vaccination status: Unvaccinated-husband still undecided.  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  Subsequently on 2/10, she developed obtundation and respiratory arrest leading to intubation.  She has remained persistently obtunded, now has a trach and a PEG.  Hospital course complicated by persistent fever, Serratia pneumonia and now pre-orbital cellulitis  As of 3/25 with adjustments in hypertonicity medications patient began to improve regarding hypertonicity.  Eventually started on provigil with improvements in alertness.  Evaluated by rehab physician who recommended increasing provigil dose further.  Subsequently patient has become more alert.  Most days she is able to track and with encouragement able to follow simple commands especially since partial obstruction glasses placed.  See progress note dictated 4/27 for expanded details.   2/09 Admit for [place] 2/10 EVD placed, later intubated due to obtundation and respiratory arrest 2/11 MRI Brain, hypertonic saline d/c'd, EVD not working.  Husband did not consent to PICC  2/13 EVD removed 2/17 [place] discussion with family, PMT 2/24 Bedside trach and PEG 3/1 Transferred to [facility]  Subjective: Slepeing and did not awaken  Objective: Vitals:   02/16/21 0452 02/16/21 0723  BP: (!) 144/82 119/72  Pulse: 96 96  Resp: 18  18  Temp: 98.6 F (37 C) 98.6 F (37 C)  SpO2: 99% 100%   Filed Weights   02/14/21 0500 02/15/21 0407 02/16/21 0500  Weight: 62.7 kg 62.4 kg 61.6 kg    Exam:       Gen: Asleep and appears to be comfortable Respiratory: #4.0 cuffless trach, trach collar with FiO2 21% at 5L-anterior lung sounds CTA Cardiovascular: S1S2, normotensive, regular pulse Abdomen:  PEG /nocturnal tube feedings. LBM 7/30-soft with active BS Neurological: Less left facial droop resolved.  Appears to have hearing loss.  Moves right upper extremity purposefully strength 4/5. RLE strength unable to be assessed, noted to move purposely.  Has  left-side neglect.   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 -Previously given 2 days of IV Rocephin and since no signs of sepsis was given a one-time dose of fosfomycin over the weekend -Purewick dc'd due to contributing to recurrent UTIs  Tracheostomy dependent -Primary barrier to decannulation is poor/very weak cough reflex -Trach team has had several discussions with patient's husband outlining current limitations for decannulation and uncertainty regarding this would even be possible in the future -At this time is not a candidate for decannulation -7/21 patient self decannulated, trach replaced easily by RT -7/27 RT performed oral tracheal suction without patient demonstrating cough reflex or gag reflex.  Recurrent right knee pain/known ligamentous injury and chronic osteoarthritis -s/p injection of Marcaine and Depomedrol 6/30 -Continue scheduled Celebrex, Tylenol and Ultram  Hypertensive emergency/Malignant HTN -Continue Norvasc, beta-blocker and hydralazine.  Dysphagia -Continue dysphagia 1 with thin liquids -Inconsistent oral intake therefore has been resumed on nocturnal tube feedings  History of urinary retention -Continue Cardura    Other problems: Right upper extremity thrombophlebitis - [name] -> age indeterminate SVT of right cephalic vein  Neck Contracture/torticollis -- Resolved  ESBL positive Enterobacter UTI -Has completed 5 days of meropenem -Contact isolation during hospitalization -continue Oxybutynin suspected bladder spasms  Klebsiella UTI -Resistant to nitrofurantoin and ampicillin -To minimize risk of  recurrent UTI have asked that pure wick only be used at bedtime on a 10 PM to 8 AM schedule  Recurrent diarrhea -Resolved  Bacterial Conjunctivitis /periorbital cellulitis -Resolved  Chronic pansinusitis/recurrent fevers 2/2 aspiration PNA;History of Serratia Pneumonia  Hospital Acquired Pneumonia:  -Completed several courses of antibiotics -Follow-up imaging including sinus CT consistent with chronic but stable and resolving sinusitis   Anemia -Stable  HLD -Continue Lipitor      Data Reviewed: Basic Metabolic Panel: Recent Labs  Lab 02/11/21 0936  NA 138  K 3.5  CL 98  CO2 30  GLUCOSE 133*  BUN 14  CREATININE 0.60  CALCIUM 9.3     Liver Function Tests: Recent Labs  Lab 02/11/21 0936  AST 24  ALT 31  ALKPHOS 75  BILITOT 0.4  PROT 6.9  ALBUMIN 3.6      CBC: Recent Labs  Lab 02/11/21 0936  WBC 5.1  NEUTROABS 3.3  HGB 10.9*  HCT 33.5*  MCV 77.0*  PLT 282      CBG: Recent Labs  Lab 02/14/21 0326 02/14/21 2012 02/14/21 2311 02/15/21 0315 02/15/21 0831  GLUCAP 137* 140* 109* 118* 151*     Studies: No results found.   Scheduled Meds:  acetaminophen  650 mg Per  Tube Q6H   amLODipine  10 mg Per Tube Daily   atorvastatin  40 mg Per Tube Daily   baclofen  5 mg Per Tube 3 times per day   carvedilol  25 mg Per Tube BID WC   celecoxib  200 mg Per Tube BID   chlorhexidine gluconate (MEDLINE KIT)  15 mL Mouth Rinse BID   Chlorhexidine Gluconate Cloth  6 each Topical Daily   diclofenac Sodium  4 g Topical QID   doxazosin  2 mg Per Tube Daily   feeding supplement  237 mL Oral BID BM   feeding supplement (JEVITY 1.5 CAL/FIBER)  900 mL Per Tube Q24H   free water  200 mL Per Tube Q4H   hydrALAZINE  37.5 mg Per Tube Q8H   mouth rinse  15 mL Mouth Rinse 5 X Daily   modafinil  200 mg Per Tube Daily   multivitamin with minerals  1 tablet Per Tube Daily   pantoprazole sodium  40 mg Per Tube Daily   Continuous Infusions:  sodium chloride  Stopped (10/26/20 1445)    Principal Problem:   ICH (intracerebral hemorrhage) ([place]) Active Problems:   Acute hypoxemic respiratory failure (HCC)   Tracheostomy dependent ([place])   Palliative care by specialist   Muscle hypertonicity   Oropharyngeal dysphagia   Poor prognosis   Aspiration pneumonia of right lower lobe (HCC)   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  Palliative care.   PCCM continues to follow.  No decannulation.   Procedures: Tracheostomy PEG tube EEG Echocardiogram  Antibiotics: Vancomycin 2/12-2/15 Maxipime 2/12-2/15 Ancef 2/19-2/27 Vancomycin 2/20 x 1 dose Maxipime 2/27- 3/5 Vancomycin 3/1-3/3 Ancef 3/6-3/14 Cipro 3/20 4-3/30 Unasyn 3/30-4/4 Rocephin 4/11-4/12 Meropenem 4/12-4/17   Time spent: 15 minutes    Erin Hearing ANP  [facility] 7 am - 330 pm/M-F for direct patient care and secure chat Please refer to [place] for contact info [phone]  Days

## 2021-02-17 MED ORDER — CHLORHEXIDINE GLUCONATE 0.12 % MT SOLN
OROMUCOSAL | Status: AC
  Filled 2021-02-17: qty 15

## 2021-02-17 NOTE — Plan of Care (Signed)
  Problem: Coping: Goal: Will identify appropriate support needs Outcome: Progressing   Problem: Health Behavior/Discharge Planning: Goal: Ability to manage health-related needs will improve Outcome: Progressing   Problem: Self-Care: Goal: Verbalization of feelings and concerns over difficulty with self-care will improve Outcome: Progressing Goal: Ability to communicate needs accurately will improve Outcome: Progressing   Problem: Nutrition: Goal: Risk of aspiration will decrease Outcome: Progressing   Problem: Intracerebral Hemorrhage Tissue Perfusion: Goal: Complications of Intracerebral Hemorrhage will be minimized Outcome: Progressing   Problem: Education: Goal: Knowledge of General Education information will improve Description: Including pain rating scale, medication(s)/side effects and non-pharmacologic comfort measures Outcome: Progressing   Problem: Health Behavior/Discharge Planning: Goal: Ability to manage health-related needs will improve Outcome: Progressing   Problem: Clinical Measurements: Goal: Ability to maintain clinical measurements within normal limits will improve Outcome: Progressing Goal: Will remain free from infection Outcome: Progressing Goal: Diagnostic test results will improve Outcome: Progressing Goal: Respiratory complications will improve Outcome: Progressing Goal: Cardiovascular complication will be avoided Outcome: Progressing   Problem: Coping: Goal: Level of anxiety will decrease Outcome: Progressing   Problem: Elimination: Goal: Will not experience complications related to bowel motility Outcome: Progressing Goal: Will not experience complications related to urinary retention Outcome: Progressing   Problem: Pain Managment: Goal: General experience of comfort will improve Outcome: Progressing   Problem: Skin Integrity: Goal: Risk for impaired skin integrity will decrease Outcome: Progressing   Problem: Intracerebral  Hemorrhage Tissue Perfusion: Goal: Complications of Intracerebral Hemorrhage will be minimized Outcome: Progressing   Problem: Ischemic Stroke/TIA Tissue Perfusion: Goal: Complications of ischemic stroke/TIA will be minimized Outcome: Progressing   

## 2021-02-17 NOTE — Progress Notes (Addendum)
Occupational Therapy Treatment Patient Details Name: Charlene [name] MRN: [id number] DOB: 05-21-1966 Today's Date: 02/17/2021    History of present illness Pt is 55 y/o female presents to [facility] on 08/26/20 with L sided weakness and headaches. 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 s/p Right frontal ventricular catheter placement on 2/10, stopped functioning on 2/11.  MRI 2/11 shows right greater than left mesial temporal lobes / parahippocampal cortex, bilateral basal ganglia, and possibly the upper pons that are concerning for acute infarcts, most likely secondary to mass effect from the hemorrhage. ETT 2/10-2/24.  Pt with trach and PEG on 09/10/20. Hospital course complicated by persistent fever, Serratia pneumonia and now pre-orbital cellulitis.   Pt self decannulated 02/04/21, trach re-inserted by RT. Pt dislodged PEG, guided replacement 8/2.  PMH includes anxiety, HTN.   OT comments  Pt supine in bed, father at side today.  RN assisted with maximove transfer to recliner, max assist for rolling in bed.  Seated in recliner, engaged in ADL tasks including washing face, donning glasses, and drinking ensure.  She requires up to mod assist for ADL tasks for task initiation, sustained attention and thoroughness. Continues to require multimodal cueing. She lifts her arm up when therapist reaches for BP cuff to assist with donning, gives thumbs up when asked "are you comfortable?".  Bil mitts reapplied at end of session. RN/NT aware of position and keeping eye on pt.  Will follow acutely.    Follow Up Recommendations  SNF;Supervision/Assistance - 24 hour    Equipment Recommendations  None recommended by OT    Recommendations for Other Services      Precautions / Restrictions Precautions Precautions: Fall Precaution Comments: peg and trach, bil mitts        Mobility Bed Mobility Overal bed mobility: Needs Assistance Bed Mobility: Rolling Rolling: Max assist         General bed mobility comments: max assist to roll for placement of lift pad    Transfers Overall transfer level: Needs assistance Equipment used: Ambulation equipment used             General transfer comment: maximove to recliner, total assist +2 with RN assist    Balance                                           ADL either performed or assessed with clinical judgement   ADL Overall ADL's : Needs assistance/impaired Eating/Feeding: Minimal assistance;Sitting Eating/Feeding Details (indicate cue type and reason): sitting upright in recliner, patient reaching for ensure drink with min assist to bring to mouth and manage straw Grooming: Moderate assistance;Sitting;Wash/dry face Grooming Details (indicate cue type and reason): reaches for wash cloth, requires hand over hand to initiate task and able to sustain for apporx 5 seconds before further cueing for task attention and thoroughness                 Toilet Transfer: Total assistance;+2 for physical assistance;+2 for safety/equipment Toilet Transfer Details (indicate cue type and reason): lift into w/c         Functional mobility during ADLs: Total assistance;+2 for physical assistance;+2 for safety/equipment       Vision   Additional Comments: R gaze preference, able to scan to L to engage with  thearpist briefly   Perception     Praxis      Cognition Arousal/Alertness: Awake/alert Behavior During Therapy: Flat affect Overall Cognitive Status: Impaired/Different from baseline Area of Impairment: Following commands;Problem solving;Attention;Awareness                   Current Attention Level: Sustained   Following Commands: Follows one step commands with increased time;Follows one step commands inconsistently   Awareness: Intellectual Problem Solving: Slow  processing;Decreased initiation;Requires verbal cues;Difficulty sequencing;Requires tactile cues General Comments: patient awake and engaging today, following commands and assisted with donning glasses, washing face, and reaching arm up when therapist was getting BP cuff.        Exercises     Shoulder Instructions       General Comments PROM of L UE at elbow, increased tone noted and utilize pillow to provide some passive extension due to flexion synergy    Pertinent Vitals/ Pain       Pain Assessment: Faces Faces Pain Scale: Hurts a little bit Pain Location: bil knees R >L today Pain Descriptors / Indicators: Grimacing;Guarding Pain Intervention(s): Limited activity within patient's tolerance;Monitored during session;Repositioned  Home Living                                          Prior Functioning/Environment              Frequency  Min 2X/week        Progress Toward Goals  OT Goals(current goals can now be found in the care plan section)  Progress towards OT goals: Progressing toward goals  Acute Rehab OT Goals Patient Stated Goal: Pt unable to state OT Goal Formulation: Patient unable to participate in goal setting Time For Goal Achievement: 02/23/21 Potential to Achieve Goals: Fair  Plan Discharge plan remains appropriate;Frequency remains appropriate    Co-evaluation                 AM-PAC OT "6 Clicks" Daily Activity     Outcome Measure   Help from another person eating meals?: A Little Help from another person taking care of personal grooming?: A Lot Help from another person toileting, which includes using toliet, bedpan, or urinal?: Total Help from another person bathing (including washing, rinsing, drying)?: Total Help from another person to put on and taking off regular upper body clothing?: Total Help from another person to put on and taking off regular lower body clothing?: Total 6 Click Score: 9    End of Session  Equipment Utilized During Treatment: Oxygen  OT Visit Diagnosis: Muscle weakness (generalized) (M62.81);Apraxia (R48.2);Cognitive communication deficit (R41.841);Hemiplegia and hemiparesis Symptoms and signs involving cognitive functions: Other Nontraumatic ICH Hemiplegia - Right/Left: Right Hemiplegia - caused by: Nontraumatic intracerebral hemorrhage   Activity Tolerance Patient tolerated treatment well   Patient Left in chair;with call bell/phone within reach;with nursing/sitter in room   Nurse Communication Mobility status;Need for lift equipment;Precautions;Other (comment) (RN and NT aware pt in recliner with father at side, no chair alarm in chair and RN okay with this since family at side)        Time: 1231-1305 OT Time Calculation (min): 34 min  Charges: OT General Charges $OT Visit: 1 Visit OT Treatments $Self Care/Home Management : 8-22 mins $Therapeutic Activity: 8-22 mins  Barry Brunner, OT Acute Rehabilitation Services Pager [phone] Office [phone]    [name] 02/17/2021, 1:47  PM

## 2021-02-17 NOTE — Progress Notes (Signed)
[facility] PROGRESS NOTE  Charlene [name] [id number]:[id number] DOB: July 14, 1966 DOA: 08/26/2020 PCP: Patient, [name] (Inactive)    October 08, 2020     10/16/2020                        10/16/2020     10/18/2020                        10/20/2020      12/30/2020                   12/30/2020   01/04/2021   Status: Remains inpatient appropriate because:Altered mental status and Unsafe d/c plan: Requirement to discharge to trach capable SNF   Dispo: The patient is from: Home              Anticipated d/c is to: SNF trach capable[place]-we have recently discovered that the [facility] may have a trach bed available in clinicals have been sent to the facility for review -trach team re eval 7/25 and rec NOT to decannulate 2/2 and lack of appropriate voluntary and involuntary cough response              Patient currently is medically stable to d/c.   Difficult to place patient Yes   Level of care: Telemetry Medical  Code Status: Full Family Communication: husband Charlene [name] [phone]) is her POA and legal contact.  [name] at bedside on 7/26.  Also updated daughter Charlene [name] by telephone on 7/26.  Detailed voicemail left for husband Charlene [name] on 8/3 DVT prophylaxis: Chronically bedbound.  5/6: We will discontinue prophylactic Lovenox   Vaccination status: Unvaccinated-husband still undecided.  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  Subsequently on 2/10, she developed obtundation and respiratory arrest leading to intubation.  She has remained persistently obtunded, now has a trach and a PEG.  Hospital course complicated by persistent fever, Serratia pneumonia and now pre-orbital cellulitis  As of 3/25 with adjustments in hypertonicity medications patient began to improve regarding hypertonicity.  Eventually started on provigil with improvements in alertness.  Evaluated by rehab physician who recommended increasing provigil dose further.  Subsequently patient has become more alert.  Most days she is able to track and with encouragement able to follow simple commands especially since partial obstruction glasses placed.  See progress note dictated 4/27 for expanded details.   2/09 Admit for [place] 2/10 EVD placed, later intubated due to obtundation and respiratory arrest 2/11 MRI Brain, hypertonic saline d/c'd, EVD not working.  Husband did not consent to PICC  2/13 EVD removed 2/17 [place] discussion with family, PMT 2/24 Bedside trach and PEG 3/1 Transferred to [facility]  Subjective: Alert and interactive during conversation.  No obvious complaints reported by patient when asked.  Explained rationale  for minutes to patient but also let her know that when family at bedside mitts could be removed but then would need to be replaced when she was alone in the room.  Objective: Vitals:   02/17/21 0808 02/17/21 0813  BP:  132/84  Pulse: 79 91  Resp: 18 18  Temp:  97.8 F (36.6 C)  SpO2: 99%    Filed Weights   02/14/21 0500 02/15/21 0407 02/16/21 0500  Weight: 62.7 kg 62.4 kg 61.6 kg    Exam:       Gen: Alert, no acute distress. Respiratory: #4.0 cuffless trach, trach collar with FiO2 21% at 5L-anterior lung sounds clear to auscultation Cardiovascular: S1-S2, normotensive, no  peripheral edema Abdomen:  PEG /nocturnal tube feedings. LBM 7/30-soft and nontender.  Normoactive bowel sounds. Neurological: Less left facial droop resolved.  Appears to have hearing loss.  Moves right upper extremity purposefully strength 4/5. RLE strength unable to be assessed, noted to move purposely.  Has left-side neglect.   Psychiatric: Awake and responds appropriately to questions asked but still remains nonverbal and has not been writing anything therefore unable to accurately assess orientation.  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 -Previously given 2 days of IV Rocephin and since no signs of sepsis was given a one-time dose of fosfomycin over the weekend -Purewick dc'd due to contributing to recurrent UTIs  Tracheostomy dependent -Primary barrier to decannulation is poor/very weak cough reflex -Trach team has had several discussions with patient's husband outlining current limitations for decannulation and uncertainty regarding this would even be possible in the future -At this time is not a candidate for decannulation -7/21 patient self decannulated, trach replaced easily by RT -7/27 RT performed oral tracheal suction without patient demonstrating cough reflex or gag reflex. -We will test cough and gag with larger bore instrument (oral suction device)  Recurrent right knee pain/known ligamentous injury and chronic osteoarthritis -s/p injection of Marcaine and Depomedrol 6/30 -Continue scheduled Celebrex, Tylenol and Ultram  Hypertensive emergency/Malignant HTN -Continue Norvasc, beta-blocker and hydralazine.  Dysphagia -Continue dysphagia 1 with  thin liquids -Inconsistent oral intake therefore has been resumed on nocturnal tube feedings  History of urinary retention -Continue Cardura    Other problems: Right upper extremity thrombophlebitis - [name] -> age indeterminate SVT of right cephalic vein  Neck Contracture/torticollis -- Resolved  ESBL positive Enterobacter UTI -Has completed 5 days of meropenem -Contact isolation during hospitalization -continue Oxybutynin suspected bladder spasms  Klebsiella UTI -Resistant to nitrofurantoin and ampicillin -To minimize risk of recurrent UTI have asked that pure wick only be used at bedtime on a 10 PM to 8 AM schedule  Recurrent diarrhea -Resolved  Bacterial Conjunctivitis /periorbital cellulitis -Resolved  Chronic pansinusitis/recurrent fevers 2/2 aspiration PNA;History of Serratia Pneumonia  Hospital Acquired Pneumonia:  -Completed several courses of antibiotics -Follow-up imaging including sinus CT consistent with chronic but stable and resolving sinusitis   Anemia -Stable  HLD -Continue Lipitor      Data Reviewed: Basic Metabolic Panel: Recent Labs  Lab 02/11/21 0936  NA 138  K 3.5  CL 98  CO2 30  GLUCOSE 133*  BUN 14  CREATININE 0.60  CALCIUM 9.3     Liver Function Tests: Recent Labs  Lab 02/11/21 0936  AST 24  ALT  31  ALKPHOS 75  BILITOT 0.4  PROT 6.9  ALBUMIN 3.6      CBC: Recent Labs  Lab 02/11/21 0936  WBC 5.1  NEUTROABS 3.3  HGB 10.9*  HCT 33.5*  MCV 77.0*  PLT 282      CBG: Recent Labs  Lab 02/14/21 0326 02/14/21 2012 02/14/21 2311 02/15/21 0315 02/15/21 0831  GLUCAP 137* 140* 109* 118* 151*     Studies: IR Replc Gastro/Colonic Tube Percut W/Fluoro  Result Date: 02/16/2021 INDICATION: Dislodged gastrostomy tube. Chronic dysphagia and protein calorie malnutrition post CVA. Request replacement. EXAM: FLUOROSCOPIC GUIDED REPLACEMENT OF GASTROSTOMY TUBE COMPARISON:  None. MEDICATIONS: None. CONTRAST:  30m  OMNIPAQUE IOHEXOL 300 MG/ML SOLN - administered into the gastric lumen FLUOROSCOPY TIME:  12 seconds COMPLICATIONS: None immediate. PROCEDURE: Informed written consent was obtained from the patient after a discussion of the risks, benefits and alternatives to treatment. Questions regarding the procedure were encouraged and answered. A timeout was performed prior to the initiation of the procedure. The upper abdomen and external portion of the existing gastrostomy tube was prepped and draped in the usual sterile fashion, and a sterile drape was applied covering the operative field. Maximum barrier sterile technique with sterile gowns and gloves were used for the procedure. A timeout was performed prior to the initiation of the procedure. The existing gastrostomy tract was identified in the left upper quadrant. A Kumpe the catheter was advanced into the gastric lumen. A spot film injection confirms intragastric location. Next, the Kumpe the catheter was cannulated with a stiff Glidewire which was coiled within the gastric fundus. A 20 Fr dilator was used to dilate the tract. Over the stiff guide wire, a new 18-French balloon inflatable gastrostomy tube was inserted. The balloon was inflated with saline and dilute contrast and pulled against the anterior inner lumen of the stomach and the external disc was cinched. Contrast was injected and a post procedural spot fluoroscopic image was obtained confirming appropriate positioning and functionality of the new gastrostomy tube. A dressing was applied. The patient tolerated the procedure well without immediate postprocedural complication. IMPRESSION: Successful fluoroscopic guided replacement of a new 18-French gastrostomy tube. The gastrostomy tube is ready for immediate use. Read by: KAscencion DikePA-C Electronically Signed   By: JMichaelle BirksMD   On: 02/16/2021 15:40     Scheduled Meds:  acetaminophen  650 mg Per Tube Q6H   amLODipine  10 mg Per Tube Daily    atorvastatin  40 mg Per Tube Daily   baclofen  5 mg Per Tube 3 times per day   carvedilol  25 mg Per Tube BID WC   celecoxib  200 mg Per Tube BID   chlorhexidine gluconate (MEDLINE KIT)  15 mL Mouth Rinse BID   Chlorhexidine Gluconate Cloth  6 each Topical Daily   diclofenac Sodium  4 g Topical QID   doxazosin  2 mg Per Tube Daily   feeding supplement  237 mL Oral TID BM   feeding supplement (JEVITY 1.5 CAL/FIBER)  900 mL Per Tube Q24H   free water  200 mL Per Tube Q4H   hydrALAZINE  37.5 mg Per Tube Q8H   mouth rinse  15 mL Mouth Rinse 5 X Daily   modafinil  200 mg Per Tube Daily   multivitamin with minerals  1 tablet Per Tube Daily   pantoprazole sodium  40 mg Per Tube Daily   Continuous Infusions:  sodium chloride Stopped (10/26/20 1445)    Principal Problem:  ICH (intracerebral hemorrhage) (HCC) Active Problems:   Acute hypoxemic respiratory failure (HCC)   Tracheostomy dependent (HCC)   Palliative care by specialist   Muscle hypertonicity   Oropharyngeal dysphagia   Poor prognosis   Aspiration pneumonia of right lower lobe (HCC)   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  Palliative care.   PCCM continues to follow.  No decannulation.   Procedures: Tracheostomy PEG tube EEG Echocardiogram  Antibiotics: Vancomycin 2/12-2/15 Maxipime 2/12-2/15 Ancef 2/19-2/27 Vancomycin 2/20 x 1 dose Maxipime 2/27- 3/5 Vancomycin 3/1-3/3 Ancef 3/6-3/14 Cipro 3/20 4-3/30 Unasyn 3/30-4/4 Rocephin 4/11-4/12 Meropenem 4/12-4/17   Time spent: 15 minutes    Erin Hearing ANP  [facility] 7 am - 330 pm/M-F for direct patient care and secure chat Please refer to [place] for contact info 174  Days

## 2021-02-18 LAB — GLUCOSE, CAPILLARY
Glucose-Capillary: 125 mg/dL — ABNORMAL HIGH (ref 70–99)
Glucose-Capillary: 134 mg/dL — ABNORMAL HIGH (ref 70–99)
Glucose-Capillary: 139 mg/dL — ABNORMAL HIGH (ref 70–99)
Glucose-Capillary: 101 mg/dL — ABNORMAL HIGH (ref 70–99)

## 2021-02-18 NOTE — Progress Notes (Signed)
[facility] PROGRESS NOTE  Charlene [name] [id number]:[id number] DOB: [birth year] DOA: 08/26/2020 PCP: Patient, [name] (Inactive)    October 08, 2020     10/16/2020                        10/16/2020     10/18/2020                        10/20/2020      12/30/2020                   12/30/2020   01/04/2021   Status: Remains inpatient appropriate because:Altered mental status and Unsafe d/c plan: Requirement to discharge to trach capable SNF   Dispo: The patient is from: Home              Anticipated d/c is to: SNF trach capable-[place] beds available-we have recently discovered that the [facility] may have a trach bed available in clinicals have been sent to the facility for review -trach team re eval 7/25 and rec NOT to decannulate 2/2 and lack of appropriate voluntary and involuntary cough response              Patient currently is medically stable to d/c.   Difficult to place patient Yes   Level of care: Telemetry Medical  Code Status: Full Family Communication: [name] [phone]) is her POA and legal contact.  [name] at bedside on 7/26.  Also updated daughter [name] by telephone on 7/26.  Detailed voicemail left for [name] on 8/3 DVT prophylaxis: Chronically bedbound.  5/6: We will discontinue prophylactic Lovenox   Vaccination status: Unvaccinated-husband still undecided.  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  Subsequently on 2/10, she developed obtundation and respiratory arrest leading to intubation.  She has remained persistently obtunded, now has a trach and a PEG.  Hospital course complicated by persistent fever, Serratia pneumonia and now pre-orbital cellulitis  As of 3/25 with adjustments in hypertonicity medications patient began to improve regarding hypertonicity.  Eventually started on provigil with improvements in alertness.  Evaluated by rehab physician who recommended increasing provigil dose further.  Subsequently patient has become more alert.  Most days she is able to track and with encouragement able to follow simple commands especially since partial obstruction glasses placed.  See progress note dictated 4/27 for expanded details.   2/09 Admit for [place] 2/10 EVD placed, later intubated due to obtundation and respiratory arrest 2/11 MRI Brain, hypertonic saline d/c'd, EVD not working.  Husband did not consent to PICC  2/13 EVD removed 2/17 [place] discussion with family, PMT 2/24 Bedside trach and PEG 3/1 Transferred to [facility]  Subjective: Somewhat interactive today.  Mother at bedside and verbalized concerns over patient having dried urine on her  pool pad all the way up her back.  States the staff addressed and has given patient a bath.  Discussed with mother that generic equivalent to depends diapers would be appropriate to use given their moisture wicking qualities.  In addition they are not lined with plastic on the outside and would be appropriate to use with current air bed.  Objective: Vitals:   02/18/21 0355 02/18/21 0658  BP:  (!) 143/88  Pulse: 95   Resp: 16   Temp:    SpO2: 99%    Filed Weights   02/15/21 0407 02/16/21 0500 02/18/21 0345  Weight: 62.4 kg 61.6 kg 64.9 kg    Exam:       Gen: Alert, calm, no acute  distress Respiratory: #4.0 cuffless trach, trach collar with FiO2 21% at 5L; lung sounds remain clear.  Normal pulse oximeter Cardiovascular: S1-S2, no tachycardia, no peripheral edema, normotensive Abdomen:  PEG /nocturnal tube feedings. LBM 8/01, soft nontender nondistended with normoactive bowel sounds. Neurological: Less left facial droop resolved.  Appears to have hearing loss.  Moves right upper extremity purposefully strength 4/5. RLE strength unable to be assessed, noted to move purposely.  Has left-side neglect.   Psychiatric: Alert.  Tracks with eyes and attempts to phonate but speech and audible.  Follows simple commands.  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 -We will test cough and gag with larger bore instrument (oral suction device) daily  Recurrent right knee pain/known ligamentous injury and chronic osteoarthritis -s/p injection of Marcaine and Depomedrol 6/30 -Continue scheduled Celebrex, Tylenol and Ultram  Hypertensive emergency/Malignant HTN -Continue Norvasc, beta-blocker and hydralazine.  Dysphagia -Continue dysphagia 1 with thin liquids -Inconsistent oral intake therefore has been resumed on nocturnal tube feedings  History of urinary retention -Continue  Cardura    Other problems: Enterobacter cystitis -Urine culture with Enterobacter resistant to cefazolin, cefepime and Septra. -Previously given 2 days of IV Rocephin and since no signs of sepsis was given a one-time dose of fosfomycin over the weekend -Purewick dc'd due to contributing to recurrent UTIs  Right upper extremity thrombophlebitis - [name] -> age indeterminate SVT of right cephalic vein  Neck Contracture/torticollis -- Resolved  ESBL positive Enterobacter UTI -Has completed 5 days of meropenem -Contact isolation during hospitalization -continue Oxybutynin suspected bladder spasms  Klebsiella UTI -Resistant to nitrofurantoin and ampicillin -To minimize risk of recurrent UTI have asked that pure wick only be used at bedtime on a 10 PM to 8 AM schedule  Recurrent diarrhea -Resolved  Bacterial Conjunctivitis /periorbital cellulitis -Resolved  Chronic pansinusitis/recurrent fevers 2/2 aspiration PNA;History of Serratia Pneumonia  Hospital Acquired Pneumonia:  -Completed several courses of antibiotics -Follow-up imaging including sinus CT consistent with chronic but stable and resolving sinusitis   Anemia -Stable  HLD -Continue Lipitor      Data Reviewed: Basic Metabolic Panel: Recent Labs  Lab 02/11/21 0936  NA 138  K 3.5  CL 98  CO2 30  GLUCOSE 133*  BUN 14  CREATININE 0.60  CALCIUM 9.3     Liver Function Tests: Recent Labs  Lab 02/11/21 0936  AST 24  ALT 31  ALKPHOS 75  BILITOT 0.4  PROT 6.9  ALBUMIN 3.6      CBC: Recent Labs  Lab 02/11/21 0936  WBC 5.1  NEUTROABS 3.3  HGB 10.9*  HCT 33.5*  MCV 77.0*  PLT 282      CBG: Recent Labs  Lab 02/14/21 0326 02/14/21 2012 02/14/21 2311 02/15/21 0315 02/15/21 0831  GLUCAP 137* 140* 109* 118* 151*     Studies: IR Replc Gastro/Colonic Tube Percut W/Fluoro  Result Date: 02/16/2021 INDICATION: Dislodged gastrostomy tube. Chronic dysphagia and protein calorie malnutrition  post CVA. Request replacement. EXAM: FLUOROSCOPIC GUIDED REPLACEMENT OF GASTROSTOMY TUBE COMPARISON:  None. MEDICATIONS: None. CONTRAST:  86m OMNIPAQUE IOHEXOL 300 MG/ML SOLN - administered into the gastric lumen FLUOROSCOPY TIME:  12 seconds COMPLICATIONS: None immediate. PROCEDURE: Informed written consent was obtained from the patient after a discussion of the risks, benefits and alternatives to treatment. Questions regarding the procedure were encouraged and answered. A timeout was performed prior to the initiation of the procedure. The upper abdomen and external portion of the existing gastrostomy tube was prepped and draped in the usual sterile fashion, and a sterile drape was applied covering the operative field. Maximum barrier sterile technique with sterile gowns and gloves were used for the procedure. A timeout was performed prior to the initiation of the procedure. The existing gastrostomy tract was identified in the left upper quadrant. A Kumpe the catheter was advanced into the gastric lumen. A spot film injection confirms intragastric location. Next, the Kumpe the catheter was cannulated with a stiff Glidewire which was coiled within the gastric fundus. A 20 Fr dilator was used to dilate the tract. Over the stiff guide wire, a new 18-French balloon inflatable gastrostomy tube was inserted. The balloon was inflated with saline and dilute contrast and pulled against the anterior inner lumen of the stomach and the external disc was cinched. Contrast was injected and a post procedural spot fluoroscopic image was obtained confirming appropriate positioning and functionality of the new gastrostomy tube. A dressing was applied. The patient tolerated the procedure well without immediate postprocedural complication. IMPRESSION: Successful fluoroscopic guided replacement of a new 18-French gastrostomy tube. The gastrostomy tube is ready for immediate use. Read by: KAscencion DikePA-C Electronically Signed   By:  JMichaelle BirksMD   On: 02/16/2021 15:40     Scheduled Meds:  acetaminophen  650 mg Per Tube Q6H   amLODipine  10 mg Per Tube Daily   atorvastatin  40 mg Per Tube Daily   baclofen  5 mg Per Tube 3 times per day   carvedilol  25 mg Per Tube BID WC   celecoxib  200 mg Per Tube BID   chlorhexidine gluconate (MEDLINE KIT)  15 mL Mouth Rinse BID   Chlorhexidine Gluconate Cloth  6 each Topical Daily   diclofenac Sodium  4 g Topical QID   doxazosin  2 mg Per Tube Daily   feeding supplement  237 mL Oral TID BM   feeding supplement (JEVITY 1.5 CAL/FIBER)  900 mL Per Tube Q24H   free water  200 mL Per Tube Q4H   hydrALAZINE  37.5 mg Per Tube Q8H   mouth rinse  15 mL Mouth Rinse 5 X Daily   modafinil  200 mg Per Tube Daily   multivitamin with minerals  1  tablet Per Tube Daily   pantoprazole sodium  40 mg Per Tube Daily   Continuous Infusions:  sodium chloride Stopped (10/26/20 1445)    Principal Problem:   ICH (intracerebral hemorrhage) (HCC) Active Problems:   Acute hypoxemic respiratory failure (HCC)   Tracheostomy dependent (HCC)   Palliative care by specialist   Muscle hypertonicity   Oropharyngeal dysphagia   Poor prognosis   Aspiration pneumonia of right lower lobe (HCC)   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  Palliative care.   PCCM continues to follow.  No decannulation.   Procedures: Tracheostomy PEG tube EEG Echocardiogram  Antibiotics: Vancomycin 2/12-2/15 Maxipime 2/12-2/15 Ancef 2/19-2/27 Vancomycin 2/20 x 1 dose Maxipime 2/27- 3/5 Vancomycin 3/1-3/3 Ancef 3/6-3/14 Cipro 3/20 4-3/30 Unasyn 3/30-4/4 Rocephin 4/11-4/12 Meropenem 4/12-4/17   Time spent: 15 minutes    Erin Hearing ANP  [facility] 7 am - 330 pm/M-F for direct patient care and secure chat Please refer to [place] for contact info 175  Days

## 2021-02-19 LAB — GLUCOSE, CAPILLARY
Glucose-Capillary: 103 mg/dL — ABNORMAL HIGH (ref 70–99)
Glucose-Capillary: 118 mg/dL — ABNORMAL HIGH (ref 70–99)
Glucose-Capillary: 124 mg/dL — ABNORMAL HIGH (ref 70–99)
Glucose-Capillary: 137 mg/dL — ABNORMAL HIGH (ref 70–99)

## 2021-02-19 MED ORDER — LACTULOSE 10 GM/15ML PO SOLN
20.0000 g | Freq: Once | ORAL | Status: AC
  Administered 2021-02-19: 20 g
  Filled 2021-02-19: qty 30

## 2021-02-19 NOTE — Progress Notes (Signed)
Occupational Therapy Treatment Patient Details Name: Charlene [name] MRN: [id number] DOB: Nov 13, 1965 Today's Date: 02/19/2021    History of present illness Pt is 55 y/o female presents to [facility] on 08/26/20 with L sided weakness and headaches. 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 s/p Right frontal ventricular catheter placement on 2/10, stopped functioning on 2/11.  MRI 2/11 shows right greater than left mesial temporal lobes / parahippocampal cortex, bilateral basal ganglia, and possibly the upper pons that are concerning for acute infarcts, most likely secondary to mass effect from the hemorrhage. ETT 2/10-2/24.  Pt with trach and PEG on 09/10/20. Hospital course complicated by persistent fever, Serratia pneumonia and now pre-orbital cellulitis.   Pt self decannulated 02/04/21, trach re-inserted by RT. Pt dislodged PEG, guided replacement 8/2.  PMH includes anxiety, HTN.   OT comments  Pt with limited active participation this date as she did not want to work with me, but she was able to demonstrate increased ability to manipulate objects with Rt hand, and was able to communicate her desire not to work with me by removing her PMV, removing her glasses, pushing my hand away.  She was significantly more purposeful this date than the last time I worked with her.  She did follow commands ~50% of the time.   Follow Up Recommendations  SNF;Supervision/Assistance - 24 hour    Equipment Recommendations  None recommended by OT    Recommendations for Other Services      Precautions / Restrictions Precautions Precautions: Fall Precaution Comments: peg and trach, bil mitts Required Braces or Orthoses: Other Brace Splint/Cast - Date Prophylactic Dressing Applied (if applicable): 02/19/21 Other Brace: bil. PRAFOs, soft R knee extension splint and soft L elbow  extension splint       Mobility Bed Mobility                    Transfers                      Balance                                           ADL either performed or assessed with clinical judgement   ADL                                               Vision       Perception     Praxis      Cognition Arousal/Alertness: Lethargic;Awake/alert Behavior During Therapy: Flat affect Overall Cognitive Status: Impaired/Different from baseline Area of Impairment: Following commands;Problem solving;Attention;Awareness                   Current Attention Level: Sustained   Following Commands: Follows one step commands with increased time;Follows one step commands inconsistently     Problem Solving: Slow processing;Decreased initiation;Difficulty sequencing;Requires verbal cues;Requires tactile cues General Comments: Pt initially asleep, but awakened with stimuli.  She followed one step commands at least 50% of the time with a delay.  placed PMV on trach x 2 and patient immediately removed PMV both times.  occlusion glasses applied and  she immediately removed those x 2, folded them up and placed them to the side.  Attempted to engage her in self ROM.  She pushed my hand away x 3.        Exercises Other Exercises Other Exercises: pt attempted to pull herself forward away from the bed using the bedrail x 2 on command - required max A to do so   Shoulder Instructions       General Comments      Pertinent Vitals/ Pain       Pain Assessment: Faces Faces Pain Scale: No hurt  Home Living                                          Prior Functioning/Environment              Frequency  Min 2X/week        Progress Toward Goals  OT Goals(current goals can now be found in the care plan section)  Progress towards OT goals: Progressing toward goals     Plan Discharge plan remains  appropriate    Co-evaluation                 AM-PAC OT "6 Clicks" Daily Activity     Outcome Measure   Help from another person eating meals?: A Little Help from another person taking care of personal grooming?: A Lot Help from another person toileting, which includes using toliet, bedpan, or urinal?: Total Help from another person bathing (including washing, rinsing, drying)?: Total Help from another person to put on and taking off regular upper body clothing?: Total Help from another person to put on and taking off regular lower body clothing?: Total 6 Click Score: 9    End of Session Equipment Utilized During Treatment: Oxygen  OT Visit Diagnosis: Muscle weakness (generalized) (M62.81);Apraxia (R48.2);Cognitive communication deficit (R41.841);Hemiplegia and hemiparesis Symptoms and signs involving cognitive functions: Other Nontraumatic ICH Hemiplegia - Right/Left: Right Hemiplegia - caused by: Nontraumatic intracerebral hemorrhage   Activity Tolerance Patient tolerated treatment well   Patient Left in bed;with call bell/phone within reach;with family/visitor present   Nurse Communication Mobility status        Time: 1240-1315 OT Time Calculation (min): 35 min  Charges: OT General Charges $OT Visit: 1 Visit OT Treatments $Therapeutic Activity: 23-37 mins  Eber Jones., OTR/L Acute Rehabilitation Services Pager [phone] Office [phone]    Jeani Hawking M 02/19/2021, 4:45 PM

## 2021-02-19 NOTE — Progress Notes (Signed)
Physical Therapy Treatment Patient Details Name: Charlene [name] MRN: [id number] DOB: March 25, 1966 Today's Date: 02/19/2021    History of Present Illness Pt is 55 y/o female presents to [facility] on 08/26/20 with L sided weakness and headaches. 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 s/p Right frontal ventricular catheter placement on 2/10, stopped functioning on 2/11.  MRI 2/11 shows right greater than left mesial temporal lobes / parahippocampal cortex, bilateral basal ganglia, and possibly the upper pons that are concerning for acute infarcts, most likely secondary to mass effect from the hemorrhage. ETT 2/10-2/24.  Pt with trach and PEG on 09/10/20. Hospital course complicated by persistent fever, Serratia pneumonia and now pre-orbital cellulitis.   Pt self decannulated 02/04/21, trach re-inserted by RT. Pt dislodged PEG, guided replacement 8/2.  PMH includes anxiety, HTN.    PT Comments    Charlene [name] was very alert and expressive today, initiating some trunk flexion and motion to assist in bed mobility and transitional movements to EOB.  We worked EOB on lateral transitions and seeing if Charlene [name] can help push her way back up to sitting EOB.  She was with assist and initiation from PT.  She was attempting to write, doing some purposeful things with her right arm.  She remains appropriate for continued therapy and did very well today.  PT will continue to follow acutely for safe mobility progression.  Goals due next session.    Follow Up Recommendations  SNF     Equipment Recommendations  Wheelchair (measurements PT);Wheelchair cushion (measurements PT);Hospital bed;Other (comment) (18x18 tilt in space, hospital bed with air overlay, hoyer lift)    Recommendations for Other Services       Precautions / Restrictions Precautions Precautions: Fall Precaution  Comments: peg and trach, bil mitts Required Braces or Orthoses: Other Brace Splint/Cast - Date Prophylactic Dressing Applied (if applicable): 02/19/21 Other Brace: bil. PRAFOs, soft R knee extension splint and soft L elbow extension splint    Mobility  Bed Mobility Overal bed mobility: Needs Assistance Bed Mobility: Rolling;Sidelying to Sit;Sit to Sidelying Rolling: Max assist   Supine to sit: Max assist   Sit to sidelying: Max assist General bed mobility comments: Pt was initiating trunk flexion, arm reaching once motion intiated during rolling and trunk down to bed once she was ready to lay back down today.  She was unable to lift legs or progress legs over the side without max assist.    Transfers                    Ambulation/Gait                 Stairs             Wheelchair Mobility    Modified Rankin (Stroke Patients Only)       Balance Overall balance assessment: Needs assistance Sitting-balance support: Feet unsupported;Single extremity supported Sitting balance-Leahy Scale: Poor Sitting balance - Comments: mostly needs mod assist EOB, worked in sitting on down to elbow and back up bil, pt assisting in righting trunk today with mod assist/initiation from PT. Postural control: Posterior lean;Left lateral lean                                  Cognition Arousal/Alertness: Awake/alert Behavior During Therapy:  (very expressive with  eyes and eyebrows today) Overall Cognitive Status: Impaired/Different from baseline Area of Impairment: Following commands;Problem solving;Attention;Awareness                   Current Attention Level: Sustained   Following Commands: Follows one step commands with increased time;Follows one step commands inconsistently Safety/Judgement: Decreased awareness of safety;Decreased awareness of deficits Awareness: Intellectual Problem Solving: Slow processing;Decreased initiation;Difficulty  sequencing;Requires verbal cues;Requires tactile cues General Comments: Pt very alert, eyes open, making motions with her hands, facial expressions, and even motions to make me think that at times she needed me to repeat because she could not hear. She attempted to write, but it was not ledgible (however she kept writing the same "letters"/"word" over and over.      Exercises Other Exercises Other Exercises: pt attempted to pull herself forward away from the bed using the bedrail x 2 on command - required max A to do so    General Comments        Pertinent Vitals/Pain Pain Assessment: Faces Faces Pain Scale: Hurts little more Pain Location: neck with ROM in left rotation Pain Descriptors / Indicators: Grimacing;Guarding Pain Intervention(s): Limited activity within patient's tolerance;Monitored during session;Repositioned    Home Living                      Prior Function            PT Goals (current goals can now be found in the care plan section) Acute Rehab PT Goals Patient Stated Goal: Pt unable to state Progress towards PT goals: Progressing toward goals    Frequency    Min 1X/week      PT Plan Current plan remains appropriate    Co-evaluation              AM-PAC PT "6 Clicks" Mobility   Outcome Measure  Help needed turning from your back to your side while in a flat bed without using bedrails?: Total Help needed moving from lying on your back to sitting on the side of a flat bed without using bedrails?: Total Help needed moving to and from a bed to a chair (including a wheelchair)?: Total Help needed standing up from a chair using your arms (e.g., wheelchair or bedside chair)?: Total Help needed to walk in hospital room?: Total Help needed climbing 3-5 steps with a railing? : Total 6 Click Score: 6    End of Session   Activity Tolerance: Patient tolerated treatment well Patient left: in bed;Other (comment);with call bell/phone within  reach;with family/visitor present (in high chair mode)   PT Visit Diagnosis: Hemiplegia and hemiparesis;Other symptoms and signs involving the nervous system (R29.898);Other abnormalities of gait and mobility (R26.89) Hemiplegia - Right/Left: Right (bil LEs L UE) Hemiplegia - dominant/non-dominant: Dominant Hemiplegia - caused by: Cerebral infarction;Nontraumatic intracerebral hemorrhage;Other Nontraumatic intracranial hemorrhage     Time: 6213-0865 PT Time Calculation (min) (ACUTE ONLY): 55 min  Charges:  $Therapeutic Activity: 23-37 mins $Neuromuscular Re-education: 23-37 mins                     Corinna Capra, PT, DPT  Acute Rehabilitation Ortho Tech Supervisor [phone] pager [phone]) [phone] office

## 2021-02-19 NOTE — Progress Notes (Signed)
[facility] PROGRESS NOTE  EMONNI DEPASQUALE TKK:[id number] DOB: 22-May-1966 DOA: 08/26/2020 PCP: Charlene [name], [name] (Inactive)    October 08, 2020     10/16/2020                        10/16/2020     10/18/2020                        10/20/2020      12/30/2020                   12/30/2020   01/04/2021   Status: Remains inpatient appropriate because:Altered mental status and Unsafe d/c plan: Requirement to discharge to trach capable SNF   Dispo: The Charlene [name] is from: Home              Anticipated d/c is to: SNF trach capable-[place]-we have recently discovered that the [facility] may have a trach bed available in clinicals have been sent to the facility for review -trach team re eval 7/25 and rec NOT to decannulate 2/2 and lack of appropriate voluntary and involuntary cough response              Charlene [name] currently is medically stable to d/c.   Difficult to place Charlene [name] Yes   Level of care: Telemetry Medical  Code Status: Full Family Communication: [name] [phone]) is her POA and legal contact.  [name] at bedside on 7/26.  Also updated daughter [name] by telephone on 7/26.  Detailed voicemail left for [name] on 8/3 DVT prophylaxis: Chronically bedbound.  5/6: We will discontinue prophylactic Lovenox   Vaccination status: Unvaccinated-husband still undecided.  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  Subsequently on 2/10, she developed obtundation and respiratory arrest leading to intubation.  She has remained persistently obtunded, now has a trach and a PEG.  Hospital course complicated by persistent fever, Serratia pneumonia and now pre-orbital cellulitis  As of 3/25 with adjustments in hypertonicity medications Charlene [name] began to improve regarding hypertonicity.  Eventually started on provigil with improvements in alertness.  Evaluated by rehab physician who recommended increasing provigil dose further.  Subsequently Charlene [name] has become more alert.  Most days she is able to track and with encouragement able to follow simple commands especially since partial obstruction glasses placed.  See progress note dictated 4/27 for expanded details.   2/09 Admit for [place] 2/10 EVD placed, later intubated due to obtundation and respiratory arrest 2/11 MRI Brain, hypertonic saline d/c'd, EVD not working.  Husband did not consent to PICC  2/13 EVD removed 2/17 [place] discussion with family, PMT 2/24 Bedside trach and PEG 3/1 Transferred to [facility]  Subjective: Alert.  Attempting to verbally communicate with me but speech was inaudible.  Objective: Vitals:  02/19/21 0528 02/19/21 0738  BP: 126/60 126/60  Pulse: 90 94  Resp:  18  Temp:    SpO2: 99% 98%   Filed Weights   02/16/21 0500 02/18/21 0345 02/19/21 0500  Weight: 61.6 kg 64.9 kg 63.8 kg    Exam:       Gen: Alert and in no acute distress Respiratory: #4.0 cuffless trach, trach collar with FiO2 21% at 5L; bilateral lung sounds remain clear.  No increased work of breathing. Cardiovascular: S1-S2, normotensive, no peripheral edema Abdomen:  PEG /nocturnal tube feedings. LBM 8/01, soft and nontender with normoactive bowel sounds. Neurological: Less left facial droop resolved.  Appears to have  hearing loss.  Moves right upper extremity purposefully strength 4/5. RLE strength unable to be assessed, noted to move purposely.  Has left-side neglect.   Psychiatric: Alert.  Seems to respond appropriately when communicated with although she is unable to consistently phonate and even mouthed words are difficult to interpret.  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 -We will test cough and gag with larger bore instrument (oral suction device) daily  Recurrent right knee pain/known ligamentous injury and chronic osteoarthritis -s/p injection of Marcaine and Depomedrol 6/30 -Continue scheduled Celebrex, Tylenol and Ultram  Constipation -Currently on MiraLAX prn noting if given regularly Charlene [name] has diarrhea -Last bowel movement documented as 8/1 so we will give one-time dose of Chronulac per tube  Hypertensive emergency/Malignant HTN -Continue Norvasc, beta-blocker and hydralazine.  Dysphagia -Continue dysphagia 1 with thin liquids -Inconsistent oral intake therefore has been resumed on nocturnal tube feedings  History of urinary retention -Continue Cardura    Other problems: Enterobacter cystitis -Urine culture with Enterobacter resistant to cefazolin, cefepime and Septra. -Previously  given 2 days of IV Rocephin and since no signs of sepsis was given a one-time dose of fosfomycin over the weekend -Purewick dc'd due to contributing to recurrent UTIs  Right upper extremity thrombophlebitis - [name] -> age indeterminate SVT of right cephalic vein  Neck Contracture/torticollis -- Resolved  ESBL positive Enterobacter UTI -Has completed 5 days of meropenem -Contact isolation during hospitalization -continue Oxybutynin suspected bladder spasms  Klebsiella UTI -Resistant to nitrofurantoin and ampicillin -To minimize risk of recurrent UTI have asked that pure wick only be used at bedtime on a 10 PM to 8 AM schedule  Recurrent diarrhea -Resolved  Bacterial Conjunctivitis /periorbital cellulitis -Resolved  Chronic pansinusitis/recurrent fevers 2/2 aspiration PNA;History of Serratia Pneumonia  Hospital Acquired Pneumonia:  -Completed several courses of antibiotics -Follow-up imaging including sinus CT consistent with chronic but stable and resolving sinusitis   Anemia -Stable  HLD -Continue Lipitor      Data Reviewed: Basic Metabolic Panel: No results for input(s): NA, K, CL, CO2, GLUCOSE, BUN, CREATININE, CALCIUM, MG, PHOS in the last 168 hours.    Liver Function Tests: No results for input(s): AST, ALT, ALKPHOS, BILITOT, PROT, ALBUMIN in the last 168 hours.     CBC: No results for  input(s): WBC, NEUTROABS, HGB, HCT, MCV, PLT in the last 168 hours.     CBG: Recent Labs  Lab 02/17/21 2307 02/18/21 0341 02/18/21 0901 02/18/21 1929 02/19/21 0004  GLUCAP 125* 134* 139* 101* 118*     Studies: No results found.   Scheduled Meds:  acetaminophen  650 mg Per Tube Q6H   amLODipine  10 mg Per Tube Daily   atorvastatin  40 mg Per Tube Daily   baclofen  5 mg Per Tube 3 times per day   carvedilol  25 mg Per Tube BID WC   celecoxib  200 mg Per Tube BID   chlorhexidine gluconate (MEDLINE KIT)  15 mL Mouth Rinse BID   Chlorhexidine Gluconate Cloth   6 each Topical Daily   diclofenac Sodium  4 g Topical QID   doxazosin  2 mg Per Tube Daily   feeding supplement  237 mL Oral TID BM   feeding supplement (JEVITY 1.5 CAL/FIBER)  900 mL Per Tube Q24H   free water  200 mL Per Tube Q4H   hydrALAZINE  37.5 mg Per Tube Q8H   mouth rinse  15 mL Mouth Rinse 5 X Daily   modafinil  200 mg Per Tube Daily   multivitamin with minerals  1 tablet Per Tube Daily   pantoprazole sodium  40 mg Per Tube Daily   Continuous Infusions:  sodium chloride Stopped (10/26/20 1445)    Principal Problem:   ICH (intracerebral hemorrhage) ([place]) Active Problems:   Acute hypoxemic respiratory failure (HCC)   Tracheostomy dependent ([place])   Palliative care by specialist   Muscle hypertonicity   Oropharyngeal dysphagia   Poor prognosis   Aspiration pneumonia of right lower lobe ([place])   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  Palliative care.   PCCM continues to follow.  No decannulation.   Procedures: Tracheostomy PEG tube EEG Echocardiogram  Antibiotics: Vancomycin 2/12-2/15 Maxipime 2/12-2/15 Ancef 2/19-2/27 Vancomycin 2/20 x 1 dose Maxipime 2/27- 3/5 Vancomycin 3/1-3/3 Ancef 3/6-3/14 Cipro 3/20 4-3/30 Unasyn 3/30-4/4 Rocephin 4/11-4/12 Meropenem 4/12-4/17   Time spent: 15 minutes    Erin Hearing ANP  [facility] 7 am - 330 pm/M-F for direct Charlene [name] care and secure chat Please refer to [place] for contact info 176  Days

## 2021-02-20 NOTE — Progress Notes (Signed)
PROGRESS NOTE    Charlene [name]  ZOX:[id number] DOB: 09-18-1965 DOA: 08/26/2020 PCP: Patient, [name] (Inactive)   Brief Narrative:  Charlene [name] is a 55 y.o. female who initially presented to [facility] 08/26/2020 due to left sided weakness and headache found to have right thalamic ICH with intraventricular extension. External ventricular drain was placed 2/10 but stopped functioning and patient became obtunded 2/11 necessitating intubation. Ultimately had placement of tracheostomy and PEG (2/24). She self-decannulated 7/21 and trach was replaced. Hospital course complicated by ESBL UTI, Serratia pneumonia, and continued encephalopathy, tracheostomy dependence. The plan is to continue care at [facility] with tracheostomy care capability.   Assessment & Plan:   Principal Problem:   ICH (intracerebral hemorrhage) (HCC) Active Problems:   Acute hypoxemic respiratory failure (HCC)   Tracheostomy dependent (HCC)   Palliative care by specialist   Muscle hypertonicity   Oropharyngeal dysphagia   Poor prognosis   Aspiration pneumonia of right lower lobe (HCC)   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   Acute cystitis without hematuria   Ileus, unspecified (HCC)   Constipation   Knee pain, right   Nonspecific abnormal electrocardiogram (ECG) (EKG)  Multiple acute strokes  Right Thalamus ICH with IVH  Obstructive Hydrocephalus/left-sided neglect /Non TBI related hypertonicity -Continue baclofen 5 mg, Provigil and rehabilitative therapies 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 -We will test cough and gag with larger bore instrument (oral suction device) daily   Recurrent right knee pain/known ligamentous injury and chronic osteoarthritis -s/p injection of Marcaine and Depomedrol 6/30 -Continue scheduled Celebrex, Tylenol and Ultram   Constipation -Currently on MiraLAX prn noting if given regularly patient has diarrhea -Last bowel movement documented as 8/1 so we will give one-time dose of Chronulac per tube   Hypertensive emergency/Malignant HTN -Continue Norvasc, beta-blocker and hydralazine.   Dysphagia -Continue dysphagia 1 with thin liquids -Inconsistent oral intake therefore has been resumed on nocturnal tube feedings   History of urinary retention -Continue Cardura       Other problems: Enterobacter cystitis -Urine culture with Enterobacter resistant to cefazolin, cefepime and Septra. -Previously given 2 days of IV Rocephin and since no signs of sepsis was given a one-time dose of fosfomycin over the weekend -Purewick dc'd due to contributing to recurrent UTIs   Right upper extremity thrombophlebitis - [name] -> age indeterminate SVT of right cephalic vein   Neck Contracture/torticollis -- Resolved   ESBL positive Enterobacter UTI -Has completed 5 days of meropenem -Contact isolation during hospitalization -continue Oxybutynin suspected bladder spasms   Klebsiella UTI -Resistant to nitrofurantoin and ampicillin -To minimize risk of recurrent UTI have asked that pure wick only be used at bedtime on a 10 PM to 8 AM schedule   Recurrent diarrhea -Resolved   Bacterial Conjunctivitis /periorbital cellulitis -Resolved   Chronic pansinusitis/recurrent fevers 2/2 aspiration PNA;History of Serratia Pneumonia  Hospital Acquired Pneumonia: -Completed several courses of antibiotics -Follow-up imaging including sinus CT consistent with  chronic but stable and resolving sinusitis   Anemia -Stable   HLD -Continue Lipitor  DVT prophylaxis: None  secondary to previous hemorrhage Code Status: FULL Family Communication: Husband is POA  Status is: Inpt  Dispo: The patient is from: Home              Anticipated d/c is to: TBD              Anticipated d/c date is: Imminent pending safe dispo location              Patient currently IS medically stable for discharge  Subjective: No acute issues/events overnight  Objective: Vitals:   02/19/21 2345 02/20/21 0408 02/20/21 0500 02/20/21 0537  BP: 135/82 119/69  (!) 141/90  Pulse: 86 75  94  Resp: 18 18    Temp:  98.2 F (36.8 C)    TempSrc:  Oral    SpO2: 100% 100%    Weight:   63.6 kg   Height:        Intake/Output Summary (Last 24 hours) at 02/20/2021 0732 Last data filed at 02/19/2021 1800 Gross per 24 hour  Intake 675 ml  Output --  Net 675 ml   Filed Weights   02/18/21 0345 02/19/21 0500 02/20/21 0500  Weight: 64.9 kg 63.8 kg 63.6 kg    Examination:  General exam: Appears calm and comfortable, no acute distress, attempting to take PO from staff Respiratory system:  #4.0 cuffless trach, on room air - without overt wheeze/rhonchi/rales Cardiovascular system: S1 & S2 heard, RRR. No JVD, murmurs, rubs, gallops or clicks. No pedal edema. Gastrointestinal system: Abdomen is nondistended, soft and nontender. No organomegaly or masses felt. Normal bowel sounds heard. Central nervous system: Awake, un-purposeful movement of RUE Skin: No rashes, lesions or ulcers Psychiatry: Unable to assess   Data Reviewed: I have personally reviewed following labs and imaging studies  CBC: No results for input(s): WBC, NEUTROABS, HGB, HCT, MCV, PLT in the last 168 hours. Basic Metabolic Panel: No results for input(s): NA, K, CL, CO2, GLUCOSE, BUN, CREATININE, CALCIUM, MG, PHOS in the last 168 hours. GFR: Estimated Creatinine Clearance: 73.2 mL/min (by C-G formula based on  SCr of 0.6 mg/dL). Liver Function Tests: No results for input(s): AST, ALT, ALKPHOS, BILITOT, PROT, ALBUMIN in the last 168 hours. No results for input(s): LIPASE, AMYLASE in the last 168 hours. No results for input(s): AMMONIA in the last 168 hours. Coagulation Profile: No results for input(s): INR, PROTIME in the last 168 hours. Cardiac Enzymes: No results for input(s): CKTOTAL, CKMB, CKMBINDEX, TROPONINI in the last 168 hours. BNP (last 3 results) No results for input(s): PROBNP in the last 8760 hours. HbA1C: No results for input(s): HGBA1C in the last 72 hours. CBG: Recent Labs  Lab 02/18/21 1929 02/19/21 0004 02/19/21 0343 02/19/21 0620 02/19/21 2058  GLUCAP 101* 118* 137* 124* 103*   Lipid Profile: No results for input(s): CHOL, HDL, LDLCALC, TRIG, CHOLHDL, LDLDIRECT in the last 72 hours. Thyroid Function Tests: No results for input(s): TSH, T4TOTAL, FREET4, T3FREE, THYROIDAB in the last 72 hours. Anemia Panel: No results for input(s): VITAMINB12, FOLATE, FERRITIN, TIBC, IRON, RETICCTPCT in the last 72 hours. Sepsis Labs: No results for input(s): PROCALCITON, LATICACIDVEN in the last 168 hours.  Recent Results (from the past 240 hour(s))  Urine Culture     Status: Abnormal   Collection Time: 02/11/21  9:25 AM   Specimen: Urine, Catheterized  Result Value Ref Range Status   Specimen Description URINE, CATHETERIZED  Final   Special Requests   Final    NONE Performed at [facility]  Lab, [address] [place], [place] [address]    Culture >=100,000 COLONIES/mL ENTEROBACTER CLOACAE (A)  Final   Report Status 02/13/2021 FINAL  Final   Organism ID, Bacteria ENTEROBACTER CLOACAE (A)  Final      Susceptibility   Enterobacter cloacae - MIC*    CEFAZOLIN >=64 RESISTANT Resistant     CEFEPIME >=32 RESISTANT Resistant     CIPROFLOXACIN 1 SENSITIVE Sensitive     GENTAMICIN <=1 SENSITIVE Sensitive     IMIPENEM 0.5 SENSITIVE Sensitive     NITROFURANTOIN 32 SENSITIVE  Sensitive     TRIMETH/SULFA >=320 RESISTANT Resistant     PIP/TAZO 16 SENSITIVE Sensitive     * >=100,000 COLONIES/mL ENTEROBACTER CLOACAE   Radiology Studies: No results found.   Scheduled Meds:  acetaminophen  650 mg Per Tube Q6H   amLODipine  10 mg Per Tube Daily   atorvastatin  40 mg Per Tube Daily   baclofen  5 mg Per Tube 3 times per day   carvedilol  25 mg Per Tube BID WC   celecoxib  200 mg Per Tube BID   chlorhexidine gluconate (MEDLINE KIT)  15 mL Mouth Rinse BID   Chlorhexidine Gluconate Cloth  6 each Topical Daily   diclofenac Sodium  4 g Topical QID   doxazosin  2 mg Per Tube Daily   feeding supplement  237 mL Oral TID BM   feeding supplement (JEVITY 1.5 CAL/FIBER)  900 mL Per Tube Q24H   free water  200 mL Per Tube Q4H   hydrALAZINE  37.5 mg Per Tube Q8H   mouth rinse  15 mL Mouth Rinse 5 X Daily   modafinil  200 mg Per Tube Daily   multivitamin with minerals  1 tablet Per Tube Daily   pantoprazole sodium  40 mg Per Tube Daily   Continuous Infusions:  sodium chloride Stopped (10/26/20 1445)     LOS: 177 days   Time spent: 12mn  WLittle Ishikawa DO [facility]  If 7PM-7AM, please contact night-coverage [email or web address]  02/20/2021, 7:32 AM

## 2021-02-21 NOTE — Progress Notes (Signed)
PROGRESS NOTE    Charlene [name]  BOF:[id number] DOB: [birth year] DOA: 08/26/2020 PCP: Patient, [name] (Inactive)   Brief Narrative:  Charlene [name] is a 55 y.o. female who initially presented to [facility] 08/26/2020 due to left sided weakness and headache found to have right thalamic ICH with intraventricular extension. External ventricular drain was placed 2/10 but stopped functioning and patient became obtunded 2/11 necessitating intubation. Ultimately had placement of tracheostomy and PEG (2/24). She self-decannulated 7/21 and trach was replaced. Hospital course complicated by ESBL UTI, Serratia pneumonia, and continued encephalopathy, tracheostomy dependence. The plan is to continue care at [facility] with tracheostomy care capability.   Assessment & Plan:   Principal Problem:   ICH (intracerebral hemorrhage) (HCC) Active Problems:   Acute hypoxemic respiratory failure (HCC)   Tracheostomy dependent (HCC)   Palliative care by specialist   Muscle hypertonicity   Oropharyngeal dysphagia   Poor prognosis   Aspiration pneumonia of right lower lobe (HCC)   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   Acute cystitis without hematuria   Ileus, unspecified (HCC)   Constipation   Knee pain, right   Nonspecific abnormal electrocardiogram (ECG) (EKG)  Multiple acute strokes  Right Thalamus ICH with IVH  Obstructive Hydrocephalus/left-sided neglect /Non TBI related hypertonicity -Continue baclofen 5 mg, Provigil and rehabilitative therapies 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 -We will test cough and gag with larger bore instrument (oral suction device) daily   Recurrent right knee pain/known ligamentous injury and chronic osteoarthritis -s/p injection of Marcaine and Depomedrol 6/30 -Continue scheduled Celebrex, Tylenol and Ultram   Constipation -Currently on MiraLAX prn noting if given regularly patient has diarrhea -Last bowel movement documented as 8/1 so we will give one-time dose of Chronulac per tube   Hypertensive emergency/Malignant HTN -Continue Norvasc, beta-blocker and hydralazine.   Dysphagia -Continue dysphagia 1 with thin liquids -Inconsistent oral intake therefore has been resumed on nocturnal tube feedings   History of urinary retention -Continue Cardura       Other problems: Enterobacter cystitis -Urine culture with Enterobacter resistant to cefazolin, cefepime and Septra. -Previously given 2 days of IV Rocephin and since no signs of sepsis was given a one-time dose of fosfomycin over the weekend -Purewick dc'd due to contributing to recurrent UTIs   Right upper extremity thrombophlebitis - [name] -> age indeterminate SVT of right cephalic vein   Neck Contracture/torticollis -- Resolved   ESBL positive Enterobacter UTI -Has completed 5 days of meropenem -Contact isolation during hospitalization -continue Oxybutynin suspected bladder spasms   Klebsiella UTI -Resistant to nitrofurantoin and ampicillin -To minimize risk of recurrent UTI have asked that pure wick only be used at bedtime on a 10 PM to 8 AM schedule   Recurrent diarrhea -Resolved   Bacterial Conjunctivitis /periorbital cellulitis -Resolved   Chronic pansinusitis/recurrent fevers 2/2 aspiration PNA;History of Serratia Pneumonia  Hospital Acquired Pneumonia: -Completed several courses of antibiotics -Follow-up imaging including sinus CT consistent with  chronic but stable and resolving sinusitis   Anemia -Stable   HLD -Continue Lipitor  DVT prophylaxis: None  secondary to previous hemorrhage Code Status: FULL Family Communication: Husband is POA  Status is: Inpt  Dispo: The patient is from: Home              Anticipated d/c is to: TBD              Anticipated d/c date is: Imminent pending safe dispo location              Patient currently IS medically stable for discharge  Subjective: No acute issues/events overnight  Objective: Vitals:   02/20/21 2344 02/21/21 0300 02/21/21 0458 02/21/21 0638  BP: 126/80   129/77  Pulse: 79     Resp: 16     Temp: 98.2 F (36.8 C)     TempSrc: Oral     SpO2: 100% 100%    Weight:   60.9 kg   Height:        Intake/Output Summary (Last 24 hours) at 02/21/2021 0746 Last data filed at 02/21/2021 0450 Gross per 24 hour  Intake --  Output 850 ml  Net -850 ml    Filed Weights   02/19/21 0500 02/20/21 0500 02/21/21 0458  Weight: 63.8 kg 63.6 kg 60.9 kg    Examination:  General exam: Appears calm and comfortable, no acute distress, attempting to take PO from staff Respiratory system:  #4.0 cuffless trach, on room air - without overt wheeze/rhonchi/rales Cardiovascular system: S1 & S2 heard, RRR. No JVD, murmurs, rubs, gallops or clicks. No pedal edema. Gastrointestinal system: Abdomen is nondistended, soft and nontender. No organomegaly or masses felt. Normal bowel sounds heard. Central nervous system: Awake, un-purposeful movement of RUE Skin: No rashes, lesions or ulcers Psychiatry: Unable to assess   Data Reviewed: I have personally reviewed following labs and imaging studies  CBC: No results for input(s): WBC, NEUTROABS, HGB, HCT, MCV, PLT in the last 168 hours. Basic Metabolic Panel: No results for input(s): NA, K, CL, CO2, GLUCOSE, BUN, CREATININE, CALCIUM, MG, PHOS in the last 168 hours. GFR: Estimated Creatinine Clearance: 73.2 mL/min (by C-G formula based on SCr of 0.6  mg/dL). Liver Function Tests: No results for input(s): AST, ALT, ALKPHOS, BILITOT, PROT, ALBUMIN in the last 168 hours. No results for input(s): LIPASE, AMYLASE in the last 168 hours. No results for input(s): AMMONIA in the last 168 hours. Coagulation Profile: No results for input(s): INR, PROTIME in the last 168 hours. Cardiac Enzymes: No results for input(s): CKTOTAL, CKMB, CKMBINDEX, TROPONINI in the last 168 hours. BNP (last 3 results) No results for input(s): PROBNP in the last 8760 hours. HbA1C: No results for input(s): HGBA1C in the last 72 hours. CBG: Recent Labs  Lab 02/18/21 1929 02/19/21 0004 02/19/21 0343 02/19/21 0620 02/19/21 2058  GLUCAP 101* 118* 137* 124* 103*    Lipid Profile: No results for input(s): CHOL, HDL, LDLCALC, TRIG, CHOLHDL, LDLDIRECT in the last 72 hours. Thyroid Function Tests: No results for input(s): TSH, T4TOTAL, FREET4, T3FREE, THYROIDAB in the last 72 hours. Anemia Panel: No results for input(s): VITAMINB12, FOLATE, FERRITIN, TIBC, IRON, RETICCTPCT in the last 72 hours. Sepsis Labs: No results for input(s): PROCALCITON, LATICACIDVEN in the last 168 hours.  Recent Results (from the past 240 hour(s))  Urine Culture     Status: Abnormal   Collection Time: 02/11/21  9:25 AM   Specimen: Urine, Catheterized  Result Value Ref Range Status   Specimen Description URINE, CATHETERIZED  Final   Special Requests   Final    NONE Performed at [facility]  Hospital Lab, [address] [place], [place] [address]    Culture >=100,000 COLONIES/mL ENTEROBACTER CLOACAE (A)  Final   Report Status 02/13/2021 FINAL  Final   Organism ID, Bacteria ENTEROBACTER CLOACAE (A)  Final      Susceptibility   Enterobacter cloacae - MIC*    CEFAZOLIN >=64 RESISTANT Resistant     CEFEPIME >=32 RESISTANT Resistant     CIPROFLOXACIN 1 SENSITIVE Sensitive     GENTAMICIN <=1 SENSITIVE Sensitive     IMIPENEM 0.5 SENSITIVE Sensitive     NITROFURANTOIN 32 SENSITIVE Sensitive      TRIMETH/SULFA >=320 RESISTANT Resistant     PIP/TAZO 16 SENSITIVE Sensitive     * >=100,000 COLONIES/mL ENTEROBACTER CLOACAE   Radiology Studies: No results found.   Scheduled Meds:  acetaminophen  650 mg Per Tube Q6H   amLODipine  10 mg Per Tube Daily   atorvastatin  40 mg Per Tube Daily   baclofen  5 mg Per Tube 3 times per day   carvedilol  25 mg Per Tube BID WC   celecoxib  200 mg Per Tube BID   chlorhexidine gluconate (MEDLINE KIT)  15 mL Mouth Rinse BID   Chlorhexidine Gluconate Cloth  6 each Topical Daily   diclofenac Sodium  4 g Topical QID   doxazosin  2 mg Per Tube Daily   feeding supplement  237 mL Oral TID BM   feeding supplement (JEVITY 1.5 CAL/FIBER)  900 mL Per Tube Q24H   free water  200 mL Per Tube Q4H   hydrALAZINE  37.5 mg Per Tube Q8H   mouth rinse  15 mL Mouth Rinse 5 X Daily   modafinil  200 mg Per Tube Daily   multivitamin with minerals  1 tablet Per Tube Daily   pantoprazole sodium  40 mg Per Tube Daily   Continuous Infusions:  sodium chloride Stopped (10/26/20 1445)     LOS: 178 days   Time spent: 85mn  Charlene [name] C Minsa Weddington, DO [facility]  If 7PM-7AM, please contact night-coverage [email or web address]  02/21/2021, 7:46 AM

## 2021-02-22 LAB — GLUCOSE, CAPILLARY
Glucose-Capillary: 104 mg/dL — ABNORMAL HIGH (ref 70–99)
Glucose-Capillary: 135 mg/dL — ABNORMAL HIGH (ref 70–99)
Glucose-Capillary: 136 mg/dL — ABNORMAL HIGH (ref 70–99)
Glucose-Capillary: 137 mg/dL — ABNORMAL HIGH (ref 70–99)
Glucose-Capillary: 151 mg/dL — ABNORMAL HIGH (ref 70–99)
Glucose-Capillary: 151 mg/dL — ABNORMAL HIGH (ref 70–99)
Glucose-Capillary: 159 mg/dL — ABNORMAL HIGH (ref 70–99)

## 2021-02-22 NOTE — TOC Progression Note (Signed)
Transition of Care [facility]) - Progression Note    Patient Details  Name: Charlene [name] MRN: [id number] Date of Birth: 08-21-1965  Transition of Care [facility]) CM/SW Contact  Janae Bridgeman, RN Phone Number: 02/22/2021, 1:33 PM  Clinical Narrative:    Case management called and spoke with Sarina Ill, CM with [facility] and she is unable to extend a bed offer to the patient due to the patient's complex care needs.  The patient has current bed offer at [facility] in [place], [place] at this time but the patient's husband has declined admission to the facility at this time.  The patient has pending court hearing scheduled for Wednesday, 02/24/2021.   Expected Discharge Plan: Skilled Nursing Facility Barriers to Discharge: Continued Medical Work up  Expected Discharge Plan and Services Expected Discharge Plan: Skilled Nursing Facility In-house Referral: Clinical Social Work Discharge Planning Services: CM Consult Post Acute Care Choice: Skilled Nursing Facility Living arrangements for the past 2 months: Single Family Home                                       Social Determinants of Health (SDOH) Interventions    Readmission Risk Interventions Readmission Risk Prevention Plan 09/29/2020  Transportation Screening Complete  PCP or Specialist Appt within 3-5 Days Complete  HRI or Home Care Consult Complete  Social Work Consult for Recovery Care Planning/Counseling Complete  Palliative Care Screening Complete  Medication Review [employer]) Complete  Some recent data might be hidden

## 2021-02-22 NOTE — Plan of Care (Signed)
  Problem: Nutrition: Goal: Risk of aspiration will decrease Outcome: Progressing   Problem: Clinical Measurements: Goal: Ability to maintain clinical measurements within normal limits will improve Outcome: Progressing Goal: Respiratory complications will improve Outcome: Progressing

## 2021-02-22 NOTE — Progress Notes (Signed)
   NAME:  Charlene [name], MRN:  [id number], DOB:  04-Dec-1965, LOS: 179 ADMISSION DATE:  08/26/2020, CONSULTATION DATE:  2/10 REFERRING MD:  Roda Shutters, CHIEF COMPLAINT:  headache   History of Present Illness:  55 year old female admitted with hypertensive R thalamic/basal ganglia ICH. Resultant obstructive hydrocephalus prompting EVD placement 2/10. Subsequently, 2/10 she had acute mental status change to obtundation and respiratory arrest resulting in intubation. Trach and Peg 2/24. Trach change to #6 cuffless on 3/11.   Hospital course complicated by Serratia pneumonia, ESBL UTI, preorbital cellulitis  Pertinent  Medical History  Hypertension, Medication non-compliance  Significant Hospital Events: Including procedures, antibiotic start and stop dates in addition to other pertinent events   2/09 Admit for ICH 2/10 EVD placed, later intubated due to obtundation and respiratory arrest 2/11 MRI Brain, hypertonic saline d/c'd, EVD not working.  Husband did not consent to PICC  2/13 EVD removed 2/17 GOC discussion with family, PMT 2/24 tracheostomy placed by Dr. Denese Killings, PEG placed by Dr. Sheliah Hatch 3/11 trach changed to Shiley 6 uncuffed 3/21 sputum culture with Serratia resistant to Ancef 4/18 28% ATC / 5L  5/08 downsized trach to 4 7/11 No significant secretions / no issues with trach 7/21 Self de-cannulated, trach replaced 7/25 ATC 21%/5L. Using PSV, some speech per SLP, eating 8/1 tolerating PMV, minimal secretions  Interim History / Subjective:   No dyspnea, cough Mom at bedside Remains on 5 L / 21% FiO2 for humidity  Objective   Blood pressure 132/82, pulse 93, temperature 98 F (36.7 C), temperature source Oral, resp. rate 17, height 5\' 5"  (1.651 m), weight 61.6 kg, last menstrual period 03/03/2014, SpO2 98 %, unknown if currently breastfeeding.    FiO2 (%):  [21 %] 21 %   Intake/Output Summary (Last 24 hours) at 02/22/2021 0959 Last data filed at 02/22/2021 0445 Gross per 24 hour   Intake 120 ml  Output 300 ml  Net -180 ml    Filed Weights   02/20/21 0500 02/21/21 0458 02/22/21 0527  Weight: 63.6 kg 60.9 kg 61.6 kg    Examination: General: chronically ill, no distress  HEENT: MM pink/moist, anicteric, trach midline c/d/i Neuro: eyes open, spontaneous movement of right arm noted, left arm with preventative soft brace in place, left sided neglect, tracks with eyes  CV: s1s2 RRR, no m/r/g PULM: No accessory muscle use, bilateral air entry present, #4 cuffless GI: soft, bsx4 active  Extremities: Contracture of left arm Skin: no rashes or lesions  Resolved Hospital Problem list     Assessment & Plan:   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 -trach care per protocol  -not a candidate for decannulation due to deconditioning / bed bound status and poor cough mechanics but if placement is really an issue can reconsider this decision, obviously the risk of recurrent respiratory failure may be high with ongoing dysphagia -ATC for moisture  -continue PMV, she is tolerating dysphagia 1 diet, she remains high aspiration risk -PT / OT efforts ongoing    PCCM will follow up again in week of 03/01/21 - call if assistance is needed sooner.  03/03/21 MD. Cyril Mourning. [facility] Pager : [phone]  If no response to pager , please call [phone] until 7 pm After 7:00 pm call Elink  [phone]   02/22/2021

## 2021-02-22 NOTE — Progress Notes (Addendum)
[facility] PROGRESS NOTE  Charlene [name] CWC:[id number] DOB: May 29, 1966 DOA: 08/26/2020 PCP: Patient, [name] (Inactive)    October 08, 2020     10/16/2020                        10/16/2020     10/18/2020                        10/20/2020      12/30/2020                   12/30/2020   01/04/2021   Status: Remains inpatient appropriate because:Altered mental status and Unsafe d/c plan: Requirement to discharge to trach capable SNF   Dispo: The patient is from: Home              Anticipated d/c is to: SNF trach capable[place]-we have recently discovered that the [facility] may have a trach bed available in clinicals have been sent to the facility for review -trach team re eval 7/25 and rec NOT to decannulate 2/2 and lack of appropriate voluntary and involuntary cough response              Patient currently is medically stable to d/c.   Difficult to place patient Yes   Level of care: Telemetry Medical  Code Status: Full Family Communication: [name] [phone]) is her POA and legal contact.  [name] by phone 8/8 DVT prophylaxis: Chronically bedbound.  5/6: We will discontinue prophylactic Lovenox   Vaccination status: Unvaccinated-husband still undecided.  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  Subsequently on 2/10, she developed obtundation and respiratory arrest leading to intubation.  She has remained persistently obtunded, now has a trach and a PEG.  Hospital course complicated by persistent fever, Serratia pneumonia and now pre-orbital cellulitis  As of 3/25 with adjustments in hypertonicity medications patient began to improve regarding hypertonicity.  Eventually started on provigil with improvements in alertness.  Evaluated by rehab physician who recommended increasing provigil dose further.  Subsequently patient has become more alert.  Most days she is able to track and with encouragement able to follow simple commands especially since partial obstruction glasses placed.  See progress note dictated 4/27 for expanded details.   2/09 Admit for [place] 2/10 EVD placed, later intubated due to obtundation and respiratory arrest 2/11 MRI Brain, hypertonic saline d/c'd, EVD not working.  Husband did not consent to PICC  2/13 EVD removed 2/17 [place] discussion with family, PMT 2/24 Bedside trach and PEG 3/1 Transferred to [facility]  Subjective: Alert and appears comfortable.  Objective: Vitals:   02/22/21 0300 02/22/21 0431  BP:  (!) 144/92  Pulse:  80  Resp:  15  Temp:  98 F (36.7 C)  SpO2: 100%  98%   Filed Weights   02/20/21 0500 02/21/21 0458 02/22/21 0527  Weight: 63.6 kg 60.9 kg 61.6 kg    Exam:       Gen: NAD, calm Respiratory: #4.0 cuffless trach, trach collar with FiO2 21% at 5L; lungs clear-no increased WOB Cardiovascular: S1S2, no edema, regular pulse Abdomen:  PEG /nocturnal tube feedings. LBM 8/01, soft and nontender, BS normoactive Neurological: Left facial droop resolved.  Appears to have hearing loss.  Moves right upper extremity purposefully strength 4/5. RLE strength unable to be assessed, noted to move purposely.  Has left-side neglect.   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 -8/8 tested gag w/oral suction catheter and tracheal cather-POSITIVE gag reflex- unable to stimulate cough but unclear if catheter coiled in posterior pharynx. Pt with strong cough if aspirates while eating  Recurrent right knee pain/known ligamentous injury and chronic osteoarthritis -s/p injection of Marcaine and Depomedrol 6/30 -Continue scheduled Celebrex, Tylenol and Ultram  Constipation -Currently on MiraLAX prn noting if given regularly patient has diarrhea -Last bowel movement documented as 8/1 so we will give one-time dose of Chronulac per tube  Hypertensive emergency/Malignant HTN -Continue Norvasc, beta-blocker and hydralazine.  Dysphagia -Continue dysphagia 1 with thin liquids -Inconsistent oral intake therefore has been resumed on nocturnal tube feedings  History of urinary retention -Continue Cardura    Other problems: Enterobacter cystitis -Urine culture with Enterobacter resistant to cefazolin, cefepime and Septra. -Previously given 2 days of IV Rocephin and since no signs of sepsis was given a one-time dose of fosfomycin over the weekend -Purewick dc'd due to contributing to recurrent UTIs  Right upper extremity thrombophlebitis - US -> age  indeterminate SVT of right cephalic vein  Neck Contracture/torticollis -- Resolved  ESBL positive Enterobacter UTI -Has completed 5 days of meropenem -Contact isolation during hospitalization -continue Oxybutynin suspected bladder spasms  Klebsiella UTI -Resistant to nitrofurantoin and ampicillin -To minimize risk of recurrent UTI have asked that pure wick only be used at bedtime on a 10 PM to 8 AM schedule  Recurrent diarrhea -Resolved  Bacterial Conjunctivitis /periorbital cellulitis -Resolved  Chronic pansinusitis/recurrent fevers 2/2 aspiration PNA;History of Serratia Pneumonia  Hospital Acquired Pneumonia:  -Completed several courses of antibiotics -Follow-up imaging including sinus CT consistent with chronic but stable and resolving sinusitis   Anemia -Stable  HLD -Continue Lipitor      Data Reviewed: Basic Metabolic Panel: No results for input(s): NA, K, CL, CO2, GLUCOSE, BUN, CREATININE, CALCIUM, MG, PHOS in the last 168 hours.    Liver Function Tests: No results for input(s): AST, ALT, ALKPHOS, BILITOT, PROT, ALBUMIN in the last 168 hours.     CBC: No results for input(s): WBC, NEUTROABS, HGB, HCT, MCV, PLT in the last 168 hours.     CBG: Recent Labs  Lab 02/18/21 1929 02/19/21 0004 02/19/21 0343 02/19/21 0620 02/19/21 2058  GLUCAP 101* 118* 137* 124* 103*       Studies: No results found.   Scheduled Meds:  acetaminophen  650 mg Per Tube Q6H   amLODipine  10 mg Per Tube Daily   atorvastatin  40 mg Per Tube Daily   baclofen  5 mg Per Tube 3 times per day   carvedilol  25 mg Per Tube BID WC   celecoxib  200 mg Per Tube BID   chlorhexidine gluconate (MEDLINE KIT)  15 mL Mouth Rinse BID   Chlorhexidine Gluconate Cloth  6 each Topical Daily   diclofenac Sodium  4 g Topical QID   doxazosin  2 mg Per Tube Daily   feeding supplement  237 mL Oral TID BM   feeding supplement (JEVITY 1.5 CAL/FIBER)  900 mL Per Tube Q24H   free water  200  mL Per Tube Q4H   hydrALAZINE  37.5 mg Per Tube Q8H   mouth rinse  15 mL Mouth Rinse 5 X Daily   modafinil  200 mg Per Tube Daily   multivitamin with minerals  1 tablet Per Tube Daily   pantoprazole sodium  40 mg Per Tube Daily   Continuous Infusions:  sodium chloride Stopped (10/26/20 1445)    Principal Problem:   ICH (intracerebral hemorrhage) ([place]) Active Problems:   Acute hypoxemic respiratory failure (HCC)   Tracheostomy dependent ([place])   Palliative care by specialist   Muscle hypertonicity   Oropharyngeal dysphagia   Poor prognosis   Aspiration pneumonia of right lower lobe ([place])   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  Palliative care.   PCCM continues to follow.  No decannulation.   Procedures: Tracheostomy PEG tube EEG Echocardiogram  Antibiotics: Vancomycin 2/12-2/15 Maxipime 2/12-2/15 Ancef 2/19-2/27 Vancomycin 2/20 x 1 dose Maxipime 2/27- 3/5 Vancomycin 3/1-3/3 Ancef 3/6-3/14 Cipro 3/20 4-3/30 Unasyn 3/30-4/4 Rocephin 4/11-4/12 Meropenem 4/12-4/17   Time spent: 15 minutes    Erin Hearing ANP  [facility] 7 am - 330 pm/M-F for direct patient care and secure chat Please refer to [place] for contact info 179  Days

## 2021-02-23 LAB — GLUCOSE, CAPILLARY
Glucose-Capillary: 93 mg/dL (ref 70–99)
Glucose-Capillary: 92 mg/dL (ref 70–99)

## 2021-02-23 MED ORDER — PROSOURCE TF PO LIQD
45.0000 mL | Freq: Every day | ORAL | Status: DC
  Administered 2021-02-23 – 2021-04-15 (×52): 45 mL
  Filled 2021-02-23 (×52): qty 45

## 2021-02-23 MED ORDER — JEVITY 1.5 CAL/FIBER PO LIQD
750.0000 mL | ORAL | Status: DC
  Administered 2021-02-23 – 2021-03-16 (×23): 750 mL
  Filled 2021-02-23 (×24): qty 948

## 2021-02-23 NOTE — Progress Notes (Signed)
[facility] PROGRESS NOTE  Charlene [name] TNB:[id number] DOB: 04-15-1966 DOA: 08/26/2020 PCP: Patient, [name] (Inactive)    October 08, 2020     10/16/2020                        10/16/2020     10/18/2020                        10/20/2020      12/30/2020                   12/30/2020   01/04/2021   Status: Remains inpatient appropriate because:Altered mental status and Unsafe d/c plan: Requirement to discharge to trach capable SNF   Dispo: The patient is from: Home              Anticipated d/c is to: SNF trach capable-[place] available-we have recently discovered that the [facility] may have a trach bed available in clinicals have been sent to the facility for review -trach team re eval 7/25 and rec NOT to decannulate 2/2 and lack of appropriate voluntary and involuntary cough response              Patient currently is medically stable to d/c.   Difficult to place patient Yes   Level of care: Telemetry Medical  Code Status: Full Family Communication: [name] [phone]) is her POA and legal contact.  [name] by phone 8/8 DVT prophylaxis: Chronically bedbound.  5/6: We will discontinue prophylactic Lovenox   Vaccination status: Unvaccinated-husband still undecided.  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  Subsequently on 2/10, she developed obtundation and respiratory arrest leading to intubation.  She has remained persistently obtunded, now has a trach and a PEG.  Hospital course complicated by persistent fever, Serratia pneumonia and now pre-orbital cellulitis  As of 3/25 with adjustments in hypertonicity medications patient began to improve regarding hypertonicity.  Eventually started on provigil with improvements in alertness.  Evaluated by rehab physician who recommended increasing provigil dose further.  Subsequently patient has become more alert.  Most days she is able to track and with encouragement able to follow simple commands especially since partial obstruction glasses placed.  See progress note dictated 4/27 for expanded details.   2/09 Admit for [place] 2/10 EVD placed, later intubated due to obtundation and respiratory arrest 2/11 MRI Brain, hypertonic saline d/c'd, EVD not working.  Husband did not consent to PICC  2/13 EVD removed 2/17 [place] discussion with family, PMT 2/24 Bedside trach and PEG 3/1 Transferred to [facility]  Subjective: Awake.  Alert.  Patient in process of being cleaned of urine and stool.  Objective: Vitals:   02/23/21 0857 02/23/21 0905  BP:  134/83  Pulse: 84 96  Resp: 16  Temp:  98.4 F (36.9 C)  SpO2: 99% 100%   Filed Weights   02/20/21 0500 02/21/21 0458 02/22/21 0527  Weight: 63.6 kg 60.9 kg 61.6 kg    Exam:       Gen: Calm.  Became somewhat agitated when bed placed in reverse Trendelenburg to assist in repositioning.  Calmed down after pillow placed under head and neck Respiratory: #4.0 cuffless trach, trach collar with FiO2 21% at 5L; posterior lung sounds clear, no increased work of breathing.  No tracheal secretions. Cardiovascular: Normal heart sounds, normotensive, no peripheral edema. Abdomen:  PEG /nocturnal tube feedings. LBM 8/08, soft and nontender on exam.  Normoactive bowel sounds.  PEG tube  insertion site unremarkable. Neurological: CN 2 through 12 intact except for possible hearing loss post stroke.  Moves right upper extremity purposefully strength 4/5. RLE 2/5 with purposeful movement.  Has left-side neglect.   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 -Continues to have positive gag reflex when tested  Constipation -Currently on MiraLAX prn noting if given regularly patient has diarrhea -Last bowel movement documented as 8/1 so we will give one-time dose of Chronulac per tube  Hypertensive emergency/Malignant HTN -Continue Norvasc, beta-blocker and hydralazine.  Dysphagia -Continue dysphagia 1 with thin liquids -Inconsistent oral intake therefore has been resumed on nocturnal tube feedings  History of urinary retention -Continue Cardura    Other problems: Enterobacter cystitis -Urine culture with Enterobacter resistant to cefazolin, cefepime and Septra. -Previously given 2 days of IV Rocephin and since no signs of sepsis was given a one-time dose of fosfomycin over the weekend -Purewick dc'd due to contributing to recurrent UTIs  Right upper extremity thrombophlebitis - [name] -> age indeterminate SVT of right cephalic vein  Neck Contracture/torticollis --  Resolved  Recurrent right knee pain/known ligamentous injury and chronic osteoarthritis -s/p injection of Marcaine and Depomedrol 6/30 -Continue scheduled Celebrex, Tylenol and Ultram  ESBL positive Enterobacter UTI -Has completed 5 days of meropenem -Contact isolation during hospitalization -continue Oxybutynin suspected bladder spasms  Klebsiella UTI -Resistant to nitrofurantoin and ampicillin -To minimize risk of recurrent UTI have asked that pure wick only be used at bedtime on a 10 PM to 8 AM schedule  Recurrent diarrhea -Resolved  Bacterial Conjunctivitis /periorbital cellulitis -Resolved  Chronic pansinusitis/recurrent fevers 2/2 aspiration PNA;History of Serratia Pneumonia  Hospital Acquired Pneumonia:  -Completed several courses of antibiotics -Follow-up imaging including sinus CT consistent with chronic but stable and resolving sinusitis   Anemia -Stable  HLD -Continue Lipitor      Data Reviewed: Basic Metabolic Panel: No results for input(s): NA, K, CL, CO2, GLUCOSE, BUN, CREATININE, CALCIUM, MG, PHOS in the last 168 hours.    Liver Function Tests: No results for input(s): AST, ALT, ALKPHOS, BILITOT, PROT, ALBUMIN in the last 168 hours.     CBC: No results for input(s): WBC, NEUTROABS, HGB, HCT, MCV, PLT in the last 168 hours.     CBG: Recent Labs  Lab 02/21/21 0648 02/21/21 1256 02/22/21 0202 02/22/21 0624 02/22/21 2345  GLUCAP 151* 104* 136* 159* 151*     Studies: No results found.   Scheduled Meds:  acetaminophen  650 mg Per Tube Q6H   amLODipine  10 mg Per Tube Daily   atorvastatin  40 mg Per Tube Daily   baclofen  5 mg Per Tube 3 times per day   carvedilol  25 mg Per Tube BID WC   celecoxib  200 mg Per Tube BID   chlorhexidine gluconate (MEDLINE KIT)  15 mL Mouth Rinse BID   Chlorhexidine Gluconate Cloth  6 each Topical Daily   diclofenac Sodium  4 g Topical QID   doxazosin  2 mg Per Tube Daily   feeding supplement  237  mL Oral TID BM   feeding supplement (JEVITY 1.5 CAL/FIBER)  750 mL Per Tube Q24H   feeding supplement (PROSource TF)  45 mL Per Tube Daily   free water  200 mL Per Tube Q4H   hydrALAZINE  37.5 mg Per Tube Q8H   mouth rinse  15 mL Mouth Rinse 5 X Daily   modafinil  200 mg Per Tube Daily   multivitamin with minerals  1 tablet Per Tube Daily   pantoprazole sodium  40 mg Per Tube Daily   Continuous Infusions:  sodium chloride Stopped (10/26/20 1445)    Principal Problem:   ICH (intracerebral hemorrhage) ([place]) Active Problems:   Acute hypoxemic respiratory failure (HCC)   Tracheostomy dependent ([place])   Palliative care by specialist   Muscle hypertonicity   Oropharyngeal dysphagia   Poor prognosis   Aspiration pneumonia of right lower lobe (HCC)   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  Palliative care.   PCCM continues to follow.  No decannulation.   Procedures: Tracheostomy PEG tube EEG Echocardiogram  Antibiotics: Vancomycin 2/12-2/15 Maxipime 2/12-2/15 Ancef 2/19-2/27 Vancomycin 2/20 x 1 dose Maxipime 2/27- 3/5 Vancomycin 3/1-3/3 Ancef 3/6-3/14 Cipro 3/20 4-3/30 Unasyn 3/30-4/4 Rocephin 4/11-4/12 Meropenem 4/12-4/17   Time spent: 15 minutes    Erin Hearing ANP  [facility] 7 am - 330 pm/M-F for direct patient care and secure chat Please refer to [place] for contact info 180  Days

## 2021-02-23 NOTE — Progress Notes (Signed)
CSW met with patient at bedside to observe her current state. CSW spoke directly to patient and she responded with a head nod after being asked if she doing was okay. Patient was asked additional questions which did not receive a response. Patient currently has one mitten on her right hand to prevent any accidental removal of lines or tubes.   CSW spoke with patient's RN Oneita Kras at bedside - RN updated on status of upcoming court hearing.  [name], MSW, LCSW Transitions of Care  Clinical Social Worker II [phone]

## 2021-02-23 NOTE — Progress Notes (Signed)
Attempted to use the suction catheter to initiate a gag reflex at 0615, very little reflex observed, however, pt felt uncomfortable by facial grimacing, mouth swabbed with antiseptic, pt reassured, will continue to monitor. Obasogie-Asidi, Analaura Messler Efe

## 2021-02-23 NOTE — Progress Notes (Signed)
Nutrition Follow-up  DOCUMENTATION CODES:  Not applicable  INTERVENTION:  -48 hour calorie count -Continue to encourage PO intake & assist as needed -Continue Ensure Enlive po TID -Continue MVI with minerals daily  Continue nocturnal TF via PEG: -Jevity 1.5 @ 20ml/hr for 10 hours from 2000-0600 ( total) -10ml PROSource TF daily Provides 1165 kcals, 58 grams protein, free water -- meets ~73% minimum estimated calorie and protein needs -Free water flushes per MD, currently Q4H (provides total free water w/ TF and flushes)  NUTRITION DIAGNOSIS:  Inadequate oral intake related to inability to eat as evidenced by NPO status. 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  Subsequently on 2/10, pt developed obtundation and respiratory arrest leading to intubation. Pt is now s/p trach/PEG placement. Hospital course complicated by Serratia pneumonia, ESBL Enterobacter UTI and pre-orbital cellulitis.  2/10 s/p EVD placement and intubation for respiratory arrest 2/13 EVD removed 2/17 palliative care consult; family requesting tx to [facility] 2/24 s/p trach and PEG 3/1 tx to [facility] 3/11 trach changed to shiley #6 cuffless 4/08 pt w/ sepsis 2/2 UTI 4/18 28% ATC/5L 5/2 CIR reevaluated pt and determined she has not made enough improvement for admit; recommended SNF 5/8 trach changed to shiley #4 cuffless 5/23 CCM evaluated and determined pt stilll unable to decannulate due to variable mental status 6/3 trach  exchanged, still shiley #4 cuffless 6/30 s/p R knee aspiration and injection 7/3 trach exchanged, still shiley #4 cuffless 7/8 diet advanced to dysphagia 1 with thin liquids 7/17 trach exchanged, still shiley #4 cuffless 7/21 trach replaced after pt self-decannulated  8/01 tolerating PMV, minimal secretions 8/02 PEG replaced after pt removed  CCM noted pt is still not a candidate for decannulation but can reconsider this decision if placement continues to be an issue; however, pt will be at risk of recurrent respiratory failure due to ongoing dysphagia and poor cough mechanics. Per MD, gag reflex was tested yesterday w/ oral suction and tracheal catheter. Pt had positive gag reflex, but was unable to have cough stimulated.   PO intake still varied, but appears to have improved since last RD assessment. Last 8 meal completions charted as 10-75% (49% average meal intake)  Per RN, pt doing well with oral nutrition supplements. Will adjust nocturnal TF regimen and initiate another calorie count given po intake appears to be improving.   Admission weight: 61.2 kg Current weight: 61.6 kg  UOP not documented x24 hours  Medications: Ensure Enlive/Plus TID, mvi with minerals, protonix Labs reviewed. CBGs [phone]  Current TF: Jevity 1.5 @ 37ml/hr administered over 12 hours (2000-0800) to provide TF per night, free water Q4H  Diet Order:   Diet Order             DIET - DYS 1 Room service appropriate? No; Fluid consistency: Thin  Diet effective now                  EDUCATION NEEDS:  No education needs have been identified at this time  Skin:  Skin Assessment: Reviewed RN  Assessment  Last BM:  8/08  Height:  Ht Readings from Last 1 Encounters:  10/18/20 5\' 5"  (1.651 m)   Weight:  Wt Readings from Last 1 Encounters:  02/22/21 61.6 kg   Ideal Body Weight:  56.8 kg  BMI:  Body mass index is 22.6 kg/m.  Estimated Nutritional Needs:  Kcal:  1600-1800 Protein:   80-90 grams Fluid:  >1.6L/d   04/24/21, MS, RD, LDN (she/her/hers) RD pager number and weekend/on-call pager number located in [place].

## 2021-02-23 NOTE — Plan of Care (Signed)
  Problem: Coping: Goal: Will identify appropriate support needs Outcome: Progressing   Problem: Health Behavior/Discharge Planning: Goal: Ability to manage health-related needs will improve Outcome: Progressing   Problem: Self-Care: Goal: Verbalization of feelings and concerns over difficulty with self-care will improve Outcome: Progressing Goal: Ability to communicate needs accurately will improve Outcome: Progressing   Problem: Nutrition: Goal: Risk of aspiration will decrease Outcome: Progressing   Problem: Intracerebral Hemorrhage Tissue Perfusion: Goal: Complications of Intracerebral Hemorrhage will be minimized Outcome: Progressing   Problem: Education: Goal: Knowledge of General Education information will improve Description: Including pain rating scale, medication(s)/side effects and non-pharmacologic comfort measures Outcome: Progressing   Problem: Health Behavior/Discharge Planning: Goal: Ability to manage health-related needs will improve Outcome: Progressing   Problem: Clinical Measurements: Goal: Ability to maintain clinical measurements within normal limits will improve Outcome: Progressing Goal: Will remain free from infection Outcome: Progressing Goal: Diagnostic test results will improve Outcome: Progressing Goal: Respiratory complications will improve Outcome: Progressing Goal: Cardiovascular complication will be avoided Outcome: Progressing   Problem: Coping: Goal: Level of anxiety will decrease Outcome: Progressing   Problem: Elimination: Goal: Will not experience complications related to bowel motility Outcome: Progressing Goal: Will not experience complications related to urinary retention Outcome: Progressing   Problem: Pain Managment: Goal: General experience of comfort will improve Outcome: Progressing   Problem: Skin Integrity: Goal: Risk for impaired skin integrity will decrease Outcome: Progressing   Problem: Intracerebral  Hemorrhage Tissue Perfusion: Goal: Complications of Intracerebral Hemorrhage will be minimized Outcome: Progressing   Problem: Ischemic Stroke/TIA Tissue Perfusion: Goal: Complications of ischemic stroke/TIA will be minimized Outcome: Progressing   

## 2021-02-23 NOTE — Progress Notes (Signed)
Physical Therapy Treatment Patient Details Name: Charlene [name] MRN: [id number] DOB: 01-Mar-1966 Today's Date: 02/23/2021    History of Present Illness Pt is 55 y/o female presents to [facility] on 08/26/20 with L sided weakness and headaches. 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 s/p Right frontal ventricular catheter placement on 2/10, stopped functioning on 2/11.  MRI 2/11 shows right greater than left mesial temporal lobes / parahippocampal cortex, bilateral basal ganglia, and possibly the upper pons that are concerning for acute infarcts, most likely secondary to mass effect from the hemorrhage. ETT 2/10-2/24.  Pt with trach and PEG on 09/10/20. Hospital course complicated by persistent fever, Serratia pneumonia and now pre-orbital cellulitis.   Pt self decannulated 02/04/21, trach re-inserted by RT. Pt dislodged PEG, guided replacement 8/2.  PMH includes anxiety, HTN.    PT Comments    Pt was able to initate more trunk and R UE pulling to come to EOB, even pulling forward to try to stand once seated EOB (so we attempted, max assist +2 into a squat x2).  Husband, Charlene [name], was present and assisting and updating therapist on new things [name] is doing.  She continues to make slow, difficult to measure gains mostly in attention, command following, and her ability to initiate movement.  PT will continue to follow acutely for safe mobility progression.  Goals assessed and updated today.  Follow Up Recommendations  SNF     Equipment Recommendations  Wheelchair (measurements PT);Wheelchair cushion (measurements PT);Hospital bed;Other (comment) (hoyer lift, 18x18 tilt in space WC hospital bed with air overlay)    Recommendations for Other Services       Precautions / Restrictions Precautions Precautions: Fall Precaution Comments: peg and trach, bil mitts Required  Braces or Orthoses: Other Brace Other Brace: bil. PRAFOs, soft R knee extension splint and soft L elbow extension splint    Mobility  Bed Mobility Overal bed mobility: Needs Assistance Bed Mobility: Rolling;Sidelying to Sit;Sit to Sidelying Rolling: Max assist Sidelying to sit: Max assist     Sit to sidelying: Max assist General bed mobility comments: Max assist of trunk and LEs to come up, pt assisting with R arm prop and some trunk flexion, pt coming back down on right elbow when she wanted to return to bed, pulling pillow under her head.    Transfers Overall transfer level: Needs assistance     Sit to Stand: Max assist;+2 physical assistance;From elevated surface         General transfer comment: Two person max assist to squat over blocked and flexed knees, had to return quicly to supine on the second attempt as pt was sliding hips too far forward. then return to sitting once repositioned. But from EOB pt reaching towards husband, pulling with her right hand and shifting weight forward over feet as if to initiate a stand  Ambulation/Gait                 Stairs             Wheelchair Mobility    Modified Rankin (Stroke Patients Only)       Balance Overall balance assessment: Needs assistance Sitting-balance support: Feet supported;Single extremity supported Sitting balance-Leahy Scale: Poor Sitting balance - Comments: min to mod assist on EOB at times pushing left with right arm, but better able to maintain sitting with less support. Postural control: Posterior lean;Left  lateral lean Standing balance support: Single extremity supported Standing balance-Leahy Scale: Zero Standing balance comment: max two person assist to squat EOB.                            Cognition Arousal/Alertness: Awake/alert Behavior During Therapy: Flat affect Overall Cognitive Status: Impaired/Different from baseline Area of Impairment: Attention;Following  commands;Safety/judgement;Awareness;Problem solving                   Current Attention Level: Sustained   Following Commands: Follows one step commands with increased time;Follows one step commands inconsistently Safety/Judgement: Decreased awareness of safety;Decreased awareness of deficits Awareness: Intellectual Problem Solving: Slow processing;Decreased initiation;Difficulty sequencing;Requires verbal cues;Requires tactile cues General Comments: Pt starting to intiate move movement, trunk flexion (towards the right) pulling on Charlene [name] today to try to come forward to stand, he reports she even reported to him that her food was cold and that she needed to go to the bathroom, sang a song with him when he sang it to her mouthing the words.      Exercises      General Comments        Pertinent Vitals/Pain Pain Assessment: Faces Faces Pain Scale: Hurts little more Pain Location: generalized with mobility Pain Descriptors / Indicators: Grimacing;Guarding Pain Intervention(s): Limited activity within patient's tolerance;Monitored during session;Repositioned    Home Living                      Prior Function            PT Goals (current goals can now be found in the care plan section) Acute Rehab PT Goals PT Goal Formulation: With family Time For Goal Achievement: 03/09/21 Potential to Achieve Goals: Fair Progress towards PT goals: Progressing toward goals    Frequency    Min 1X/week      PT Plan Current plan remains appropriate    Co-evaluation              AM-PAC PT "6 Clicks" Mobility   Outcome Measure  Help needed turning from your back to your side while in a flat bed without using bedrails?: Total Help needed moving from lying on your back to sitting on the side of a flat bed without using bedrails?: Total Help needed moving to and from a bed to a chair (including a wheelchair)?: Total Help needed standing up from a chair using your arms  (e.g., wheelchair or bedside chair)?: Total Help needed to walk in hospital room?: Total Help needed climbing 3-5 steps with a railing? : Total 6 Click Score: 6    End of Session   Activity Tolerance: Patient tolerated treatment well Patient left: in bed;with call bell/phone within reach;with family/visitor present   PT Visit Diagnosis: Hemiplegia and hemiparesis;Other symptoms and signs involving the nervous system (R29.898);Other abnormalities of gait and mobility (R26.89) Hemiplegia - Right/Left: Right (left UE, bil LEs) Hemiplegia - dominant/non-dominant: Dominant Hemiplegia - caused by: Cerebral infarction;Nontraumatic intracerebral hemorrhage;Other Nontraumatic intracranial hemorrhage     Time: 5956-3875 PT Time Calculation (min) (ACUTE ONLY): 48 min  Charges:  $Therapeutic Activity: 38-52 mins                     Corinna Capra, PT, DPT  Acute Rehabilitation Ortho Tech Supervisor [phone] pager [phone]) [phone] office

## 2021-02-24 ENCOUNTER — Inpatient Hospital Stay (HOSPITAL_COMMUNITY): Payer: Medicaid Other

## 2021-02-24 LAB — GLUCOSE, CAPILLARY: Glucose-Capillary: 117 mg/dL — ABNORMAL HIGH (ref 70–99)

## 2021-02-24 IMAGING — DX DG CHEST 1V PORT
1 series · 1 of 1 positions shown · non-contrast
Comparison: [DATE]

CLINICAL DATA: Tracheostomy

EXAM:
PORTABLE CHEST 1 VIEW

[chest]
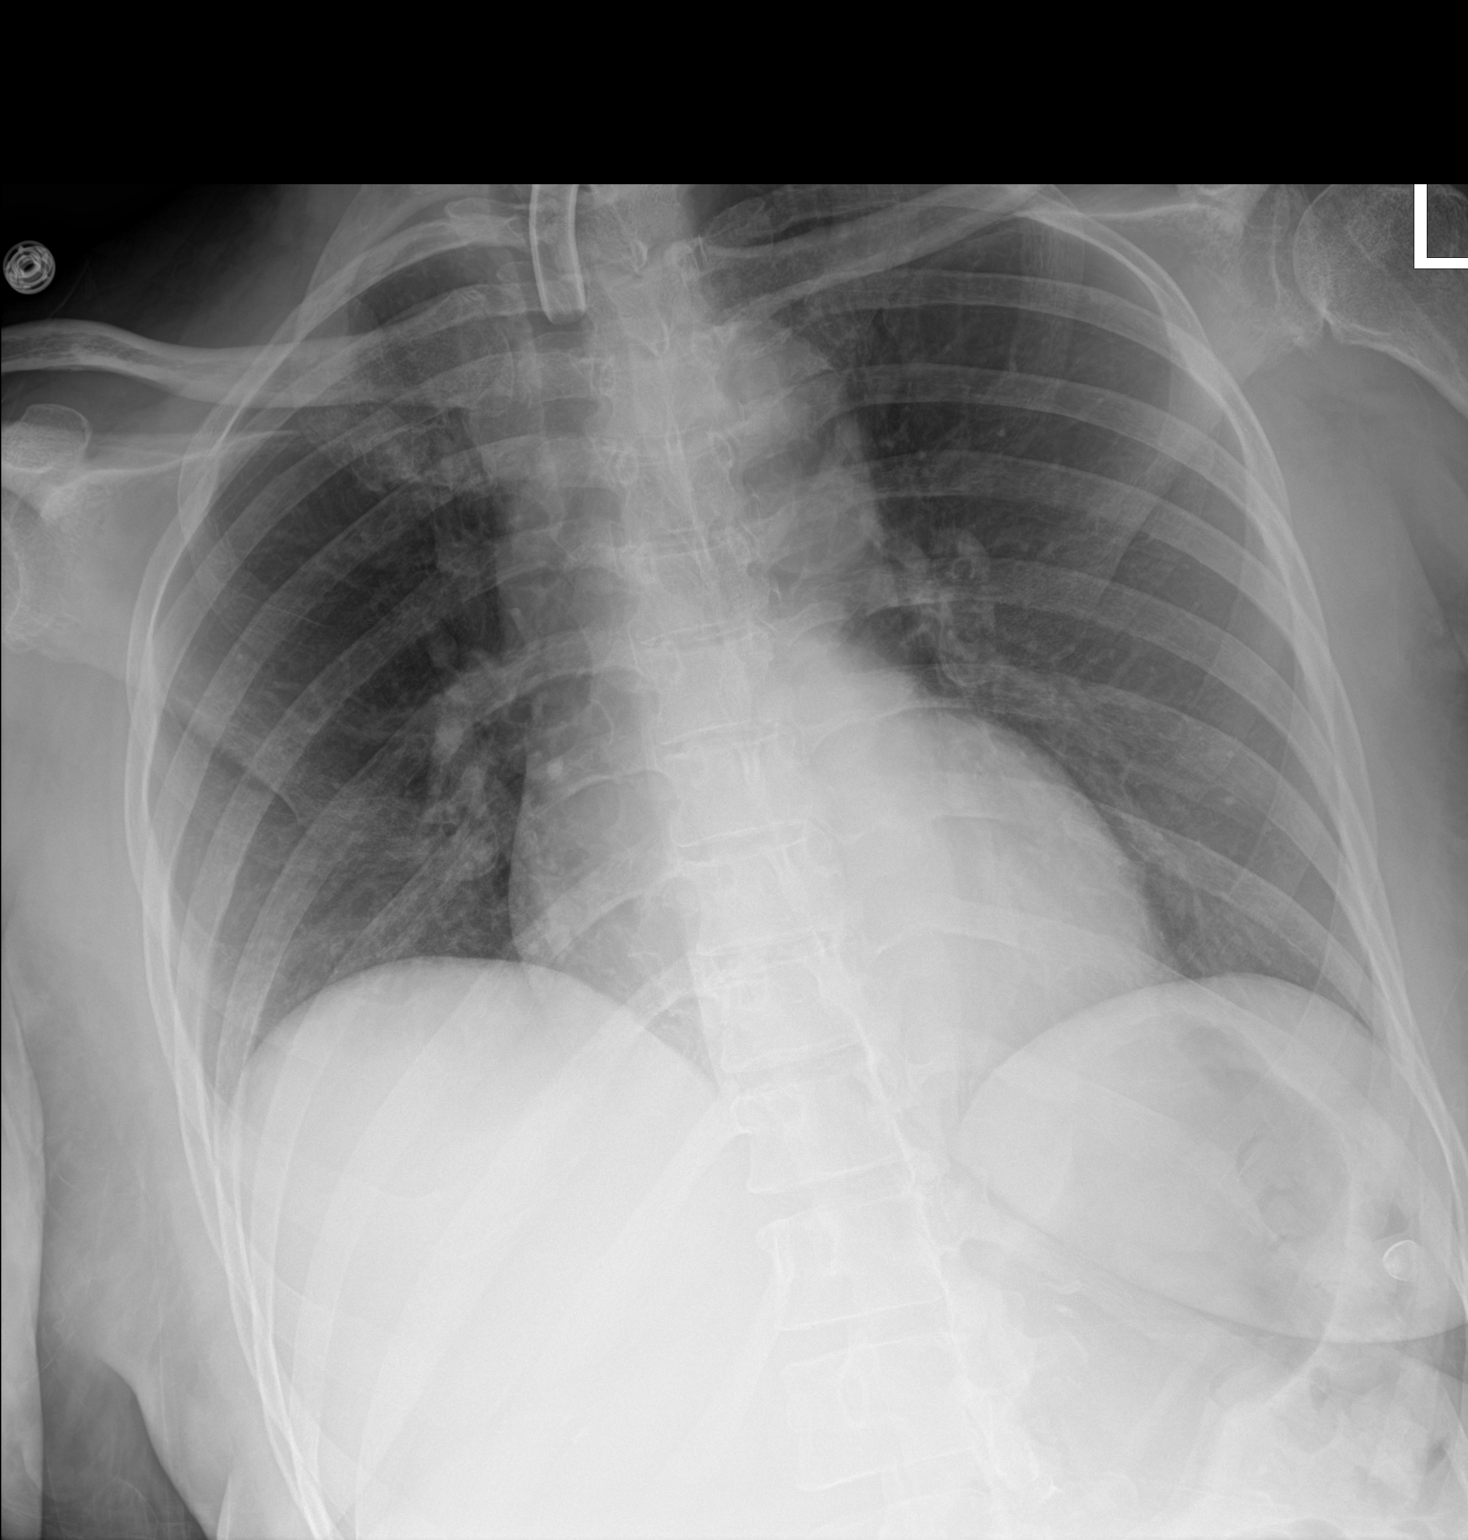

[1 of 1 positions shown; findings below may reference images not displayed]

FINDINGS: Tracheostomy projects over the mid trachea. Heart and mediastinal
contours are within normal limits. No focal opacities or effusions.
No acute bony abnormality.
IMPRESSION: No active disease.

## 2021-02-24 NOTE — Progress Notes (Signed)
  Speech Language Pathology Treatment: Dysphagia;Cognitive-Linquistic  Patient Details Name: Charlene [name] MRN: [id number] DOB: 06/03/1966 Today's Date: 02/24/2021 Time: 0930-1020 SLP Time Calculation (min) (ACUTE ONLY): 50 min  Assessment / Plan / Recommendation Clinical Impression  Pt awake, alert, very interactive and attempting to talk this am.  PMV placed, soft mitt restraint removed from right hand, pt brushed her own teeth.  Repositioned for meal and breakfast plate was supported on her lap.  Charlene [name] fed herself nine bites of eggs/sausage.  She raised her eyebrows and smiled broadly when offered coffee.  After SLP opened sugar pack and creamer, she poured each into the cup and stirred, tasted, then clearly pointed to sugar pack on window sill to indicate her desire for more.  She continues to demonstrate decreased oral seal with spillage; use of a straw facilitates better oral control.  There was one coughing episode when she had mixed solid and liquid consistencies.    Charlene [name] was verbalizing frequently this am; volume remains quite low and speech was unintelligible, however she was making great effort and following commands within context of feeding.  She was repositioned for comfort after session and soft mitt was replaced on RUE.  Continue therapy for swallowing and basic communication.   HPI HPI: Pt is 55 y/o female presents to [facility] on 2/9 with L sided weakness and headaches. PMH includes anxiety, HTN. 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 S/p Right frontal ventricular catheter placement on 2/10, stopped functioning on 2/11.  MRI 2/11 shows right greater than left mesial temporal lobes / parahippocampal cortex, bilateral basal ganglia, and possibly the upper pons that are concerning for acute infarcts, most likely secondary to mass effect from  the hemorrhage. ETT 2/10; trach/PEG 09/10/20.  Trach changed to #6 cuffless 3/11; #4 cuffless 5/4. Has had two MBS studies, the last on 7/8 - pt started on dysphagia 1, thin liquids. Trach replaced following self-decannulation 7/21.      SLP Plan  Continue with current plan of care       Recommendations  Diet recommendations: Dysphagia 1 (puree);Thin liquid Liquids provided via: Straw Medication Administration: Via alternative means Supervision: Staff to assist with self feeding;Trained caregiver to feed patient;Full supervision/cueing for compensatory strategies Compensations: Minimize environmental distractions;Small sips/bites Postural Changes and/or Swallow Maneuvers: Seated upright 90 degrees      Patient may use Passy-Muir Speech Valve: During all waking hours (remove during sleep);During PO intake/meals PMSV Supervision: Intermittent         Plan: Continue with current plan of care       GO              Reighan Hipolito L. Samson [name], MA CCC/SLP Acute Rehabilitation Services Office number [phone] Pager [phone]   [name] Charlene [name] 02/24/2021, 10:35 AM

## 2021-02-24 NOTE — Progress Notes (Signed)
[facility] PROGRESS NOTE  MAN BONNEAU MKL:[id number] DOB: May 14, 1966 DOA: 08/26/2020 PCP: Patient, [name] (Inactive)    October 08, 2020     10/16/2020                        10/16/2020     10/18/2020                        10/20/2020      12/30/2020                   12/30/2020     01/04/2021                   02/24/2021 Viv really needs a teddy bear!   Status: Remains inpatient appropriate because:Altered mental status and Unsafe d/c plan: Requirement to discharge to trach capable SNF   Dispo: The patient is from: Home              Anticipated d/c is to: SNF trach capable-[place]-we have recently discovered that the [facility] may have a trach bed available in clinicals have been sent to the facility for review -trach team re eval 7/25 and rec NOT to decannulate 2/2 and lack of appropriate voluntary and involuntary cough response              Patient currently is medically stable to d/c.   Difficult to place patient Yes   Level of care: Telemetry Medical  Code Status: Full Family Communication: [name] [phone]) is her POA and legal contact.  [name] by phone 8/8 DVT prophylaxis: Chronically bedbound.  5/6: We will discontinue prophylactic Lovenox   Vaccination status: Unvaccinated-husband still undecided.  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  Subsequently on 2/10, she developed obtundation and respiratory arrest leading to intubation.  She has remained persistently obtunded, now has a trach and a PEG.  Hospital course complicated by persistent fever, Serratia pneumonia and now pre-orbital cellulitis  As of 3/25 with adjustments in hypertonicity medications patient began to improve regarding hypertonicity.  Eventually started on provigil with improvements in alertness.  Evaluated by rehab physician who recommended increasing provigil dose further.  Subsequently patient has become more alert.  Most days she is able to track and with encouragement able to follow simple commands especially since partial obstruction glasses placed.  See progress note dictated 4/27 for expanded details.   2/09 Admit for [place] 2/10 EVD placed, later intubated due to obtundation and respiratory arrest 2/11 MRI Brain, hypertonic saline d/c'd, EVD not working.  Husband did not consent to PICC  2/13 EVD removed 2/17 [place] discussion with family, PMT 2/24 Bedside trach and PEG 3/1 Transferred to [facility]  Subjective: Sleeping soundly so I did not awaken today.  No family at bedside  Objective: Vitals:   02/24/21 0507 02/24/21 0714  BP: (!) 146/94   Pulse: 95   Resp: 16 18  Temp:    SpO2: 100%    Filed Weights   02/20/21 0500 02/21/21 0458 02/22/21 0527  Weight: 63.6 kg 60.9 kg 61.6 kg    Exam:       Gen: Sleeping soundly and appears to be in no acute physical distress Respiratory: #4.0 cuffless trach, trach collar with FiO2 21% at 5L; anterior lung sounds remain clear to auscultation, no tracheal secretions and no increased work of breathing Cardiovascular: S1-S2, normotensive, regular pulse, no peripheral edema Abdomen:  PEG /nocturnal tube feedings. LBM 8/09, soft and nontender.  PEG site unremarkable. Neurological: CN 2 through 12 intact except  for possible hearing loss post stroke.  Moves right upper extremity purposefully strength 4/5. RLE 2/5 with purposeful movement.  Has left-side neglect.   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 -Continues to have positive gag reflex when tested and also strong cough when she has intermittent aspiration while eating and drinking  Constipation -Currently on MiraLAX prn noting if given regularly patient has diarrhea -Last bowel movement documented as 8/1 so we will give one-time dose of Chronulac per tube  Hypertensive emergency/Malignant HTN -Continue Norvasc, beta-blocker and hydralazine.  Dysphagia -Continue dysphagia 1 with thin liquids -Inconsistent oral intake therefore has been resumed on nocturnal tube feedings  History of urinary retention -Continue Cardura    Other problems: Enterobacter cystitis -Urine culture with Enterobacter resistant to cefazolin, cefepime and Septra. -Previously given 2 days of IV Rocephin and since no signs of sepsis was given a one-time dose of fosfomycin over the weekend -Purewick dc'd due to contributing to recurrent UTIs  Right upper extremity thrombophlebitis - [name] -> age indeterminate SVT of right cephalic vein  Neck Contracture/torticollis -- Resolved  Recurrent right knee  pain/known ligamentous injury and chronic osteoarthritis -s/p injection of Marcaine and Depomedrol 6/30 -Continue scheduled Celebrex, Tylenol and Ultram  ESBL positive Enterobacter UTI -Has completed 5 days of meropenem -Contact isolation during hospitalization -continue Oxybutynin suspected bladder spasms  Klebsiella UTI -Resistant to nitrofurantoin and ampicillin -To minimize risk of recurrent UTI have asked that pure wick only be used at bedtime on a 10 PM to 8 AM schedule  Recurrent diarrhea -Resolved  Bacterial Conjunctivitis /periorbital cellulitis -Resolved  Chronic pansinusitis/recurrent fevers 2/2 aspiration PNA;History of Serratia Pneumonia  Hospital Acquired Pneumonia:  -Completed several courses of antibiotics -Follow-up imaging including sinus CT consistent with chronic but stable and resolving sinusitis   Anemia -Stable  HLD -Continue Lipitor      Data Reviewed: Basic Metabolic Panel: No results for input(s): NA, K, CL, CO2, GLUCOSE, BUN, CREATININE, CALCIUM, MG, PHOS in the last 168 hours.    Liver Function Tests: No results for input(s): AST, ALT, ALKPHOS, BILITOT, PROT, ALBUMIN in the last 168 hours.     CBC: No results for input(s): WBC, NEUTROABS, HGB, HCT, MCV,  PLT in the last 168 hours.     CBG: Recent Labs  Lab 02/22/21 0202 02/22/21 0624 02/22/21 2345 02/23/21 1223 02/23/21 1818  GLUCAP 136* 159* 151* 93 92     Studies: No results found.   Scheduled Meds:  acetaminophen  650 mg Per Tube Q6H   amLODipine  10 mg Per Tube Daily   atorvastatin  40 mg Per Tube Daily   baclofen  5 mg Per Tube 3 times per day   carvedilol  25 mg Per Tube BID WC   celecoxib  200 mg Per Tube BID   chlorhexidine gluconate (MEDLINE KIT)  15 mL Mouth Rinse BID   Chlorhexidine Gluconate Cloth  6 each Topical Daily   diclofenac Sodium  4 g Topical QID   doxazosin  2 mg Per Tube Daily   feeding supplement  237 mL Oral TID BM   feeding supplement  (JEVITY 1.5 CAL/FIBER)  750 mL Per Tube Q24H   feeding supplement (PROSource TF)  45 mL Per Tube Daily   free water  200 mL Per Tube Q4H   hydrALAZINE  37.5 mg Per Tube Q8H   mouth rinse  15 mL Mouth Rinse 5 X Daily   modafinil  200 mg Per Tube Daily   multivitamin with minerals  1 tablet Per Tube Daily   pantoprazole sodium  40 mg Per Tube Daily   Continuous Infusions:  sodium chloride Stopped (10/26/20 1445)    Principal Problem:   ICH (intracerebral hemorrhage) ([place]) Active Problems:   Acute hypoxemic respiratory failure (HCC)   Tracheostomy dependent ([place])   Palliative care by specialist   Muscle hypertonicity   Oropharyngeal dysphagia   Poor prognosis   Aspiration pneumonia of right lower lobe (HCC)   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  Palliative care.   PCCM continues to follow.  No decannulation.   Procedures: Tracheostomy PEG tube EEG Echocardiogram  Antibiotics: Vancomycin 2/12-2/15 Maxipime 2/12-2/15 Ancef 2/19-2/27 Vancomycin 2/20 x 1 dose Maxipime 2/27- 3/5 Vancomycin 3/1-3/3 Ancef 3/6-3/14 Cipro 3/20 4-3/30 Unasyn 3/30-4/4 Rocephin 4/11-4/12 Meropenem 4/12-4/17   Time spent: 15 minutes    Erin Hearing ANP  [facility] 7 am - 330 pm/M-F for direct patient care and secure chat Please refer to [place] for contact info [phone]  Days

## 2021-02-24 NOTE — Progress Notes (Signed)
During assessment and med pass, pt assessed to her trach not in right position with pt right hand under her trach collar. RT was notified for assessment. RT repositioned the Trach in the right place and requested for CXR. MD on-call notified and new order received. Pt resting comfortably in bed with call light within reach. Mitten applied to pt right hand. Dionne Bucy RN

## 2021-02-24 NOTE — Plan of Care (Signed)
  Problem: Coping: Goal: Will identify appropriate support needs Outcome: Progressing   Problem: Health Behavior/Discharge Planning: Goal: Ability to manage health-related needs will improve Outcome: Progressing   Problem: Self-Care: Goal: Verbalization of feelings and concerns over difficulty with self-care will improve Outcome: Progressing Goal: Ability to communicate needs accurately will improve Outcome: Progressing   Problem: Nutrition: Goal: Risk of aspiration will decrease Outcome: Progressing   Problem: Intracerebral Hemorrhage Tissue Perfusion: Goal: Complications of Intracerebral Hemorrhage will be minimized Outcome: Progressing   Problem: Education: Goal: Knowledge of General Education information will improve Description: Including pain rating scale, medication(s)/side effects and non-pharmacologic comfort measures Outcome: Progressing   Problem: Health Behavior/Discharge Planning: Goal: Ability to manage health-related needs will improve Outcome: Progressing   Problem: Clinical Measurements: Goal: Ability to maintain clinical measurements within normal limits will improve Outcome: Progressing Goal: Will remain free from infection Outcome: Progressing Goal: Diagnostic test results will improve Outcome: Progressing Goal: Respiratory complications will improve Outcome: Progressing Goal: Cardiovascular complication will be avoided Outcome: Progressing   Problem: Coping: Goal: Level of anxiety will decrease Outcome: Progressing   Problem: Elimination: Goal: Will not experience complications related to bowel motility Outcome: Progressing Goal: Will not experience complications related to urinary retention Outcome: Progressing   Problem: Pain Managment: Goal: General experience of comfort will improve Outcome: Progressing   Problem: Skin Integrity: Goal: Risk for impaired skin integrity will decrease Outcome: Progressing   Problem: Intracerebral  Hemorrhage Tissue Perfusion: Goal: Complications of Intracerebral Hemorrhage will be minimized Outcome: Progressing   Problem: Ischemic Stroke/TIA Tissue Perfusion: Goal: Complications of ischemic stroke/TIA will be minimized Outcome: Progressing   

## 2021-02-24 NOTE — Progress Notes (Signed)
Occupational Therapy Treatment Patient Details Name: Charlene [name] MRN: [id number] DOB: [birth year] Today's Date: 02/24/2021    History of present illness Pt is 55 y/o female presents to [facility] on 08/26/20 with L sided weakness and headaches. 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 s/p Right frontal ventricular catheter placement on 2/10, stopped functioning on 2/11.  MRI 2/11 shows right greater than left mesial temporal lobes / parahippocampal cortex, bilateral basal ganglia, and possibly the upper pons that are concerning for acute infarcts, most likely secondary to mass effect from the hemorrhage. ETT 2/10-2/24.  Pt with trach and PEG on 09/10/20. Hospital course complicated by persistent fever, Serratia pneumonia and now pre-orbital cellulitis.   Pt self decannulated 02/04/21, trach re-inserted by RT. Pt dislodged PEG, guided replacement 8/2.  PMH includes anxiety, HTN.   OT comments  Pt continues to make slow, but steady progress.  She was able to perform bed mobility with mod A to roll and move to EOB and required mod A for EOB sitting.  She was able to tell me she was cold.  She required total a for peri care.   Follow Up Recommendations  SNF;Supervision/Assistance - 24 hour    Equipment Recommendations  None recommended by OT    Recommendations for Other Services      Precautions / Restrictions Precautions Precautions: Fall Precaution Comments: peg and trach, bil mitts Required Braces or Orthoses: Other Brace Splint/Cast - Date Prophylactic Dressing Applied (if applicable): 02/24/21 Other Brace: bil. PRAFOs, soft R knee extension splint and soft L elbow extension splint       Mobility Bed Mobility Overal bed mobility: Needs Assistance Bed Mobility: Rolling;Sidelying to Sit;Sit to Sidelying Rolling: Mod assist Sidelying to sit: Mod  assist     Sit to sidelying: Max assist General bed mobility comments: pt able to pull on therapist to assist with rolling to the Lt, able to push herself up with mod A, and able to assist with return to supine    Transfers                      Balance Overall balance assessment: Needs assistance Sitting-balance support: Feet supported;Single extremity supported Sitting balance-Leahy Scale: Poor Sitting balance - Comments: Pt required mod A to maintain EOB sitting                                   ADL either performed or assessed with clinical judgement   ADL                               Toileting- Clothing Manipulation and Hygiene: Total assistance;Bed level Toileting - Clothing Manipulation Details (indicate cue type and reason): pt incontinent of urine.  She was assisted with peri care supine             Vision       Perception     Praxis      Cognition Arousal/Alertness: Awake/alert Behavior During Therapy: Flat affect Overall Cognitive Status: Impaired/Different from baseline Area of Impairment: Attention;Following commands;Safety/judgement;Awareness;Problem solving                   Current Attention Level: Sustained   Following Commands: Follows one step commands with  increased time;Follows one step commands inconsistently Safety/Judgement: Decreased awareness of safety   Problem Solving: Slow processing;Decreased initiation;Difficulty sequencing;Requires verbal cues;Requires tactile cues General Comments: Pt follows one step command in context of activity ~60% of the time with a delay        Exercises Other Exercises Other Exercises: PROM Lt elbow, but pt pushed therapist away and resisting   Shoulder Instructions       General Comments at the end of the session, pt with distressed look on her face.  when asked if she was okay, she shook her head "no", and then said "cold" - she did this x 2     Pertinent Vitals/ Pain       Pain Assessment: Faces Faces Pain Scale: Hurts a little bit Pain Location: generalized with mobility Pain Descriptors / Indicators: Grimacing;Guarding Pain Intervention(s): Repositioned  Home Living                                          Prior Functioning/Environment              Frequency  Min 2X/week        Progress Toward Goals  OT Goals(current goals can now be found in the care plan section)  Progress towards OT goals: Progressing toward goals     Plan Discharge plan remains appropriate    Co-evaluation                 AM-PAC OT "6 Clicks" Daily Activity     Outcome Measure   Help from another person eating meals?: A Little Help from another person taking care of personal grooming?: A Lot Help from another person toileting, which includes using toliet, bedpan, or urinal?: Total Help from another person bathing (including washing, rinsing, drying)?: Total Help from another person to put on and taking off regular upper body clothing?: Total Help from another person to put on and taking off regular lower body clothing?: Total 6 Click Score: 9    End of Session Equipment Utilized During Treatment: Oxygen  OT Visit Diagnosis: Muscle weakness (generalized) (M62.81);Apraxia (R48.2);Cognitive communication deficit (R41.841);Hemiplegia and hemiparesis Symptoms and signs involving cognitive functions: Other Nontraumatic ICH Hemiplegia - Right/Left: Right 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

## 2021-02-24 NOTE — Progress Notes (Addendum)
12:30pm: CSW was notified by Marlene Lard, hospital attorney that today's court hearing has been continued to later date.  8:15am: CSW attempted to meet with patient at bedside - she was sleeping so CSW did not wake her.  CSW spoke with RN Waldon Merl to obtain an update regarding suctioning frequency. RN reports patient does not have a lot of secretions.  CSW will participate in court hearing today at 2pm.  Edwin Dada, MSW, LCSW Transitions of Care  Clinical Social Worker II [phone]

## 2021-02-24 NOTE — Progress Notes (Signed)
Calorie Count Note  48 hour calorie count ordered Diet: Dysphagia 1, thin liquids Supplements: Ensure Enlive/Plus TID  Day 1 Results (02/23/2021): Breakfast: 0 kcals, 0 grams protein Lunch: 243 kcals, 13 grams protein Dinner: 232 kcals, 12 grams protein Supplements: no documentation provided  Total intake: 475 kcal (29% of minimum estimated needs)  25 grams protein (31% of minimum estimated needs)  Day 2 Results (02/24/2021): **still pending** Breakfast: 0 kcals, 0 grams protein Lunch: 27 kcals, 3 grams protein Dinner: not yet received Supplements: TBD  Total intake: Results pending   Nutrition Dx: Inadequate oral intake related to inability to eat as evidenced by NPO status. -- progressing, pt now on po diet Goal: Patient will meet greater than or equal to 90% of their needs -- progressing  Intervention:  -Continue 48 hour calorie count -Continue to encourage PO intake & assist as needed -Continue Ensure Enlive po TID -Continue MVI with minerals daily -Continue nocturnal TF via PEG: -Jevity 1.5 @ 21ml/hr for 10 hours from 2000-0600 ( total) -25ml PROSource TF daily       Provides 1165 kcals, 58 grams protein, free water (meets ~73% minimum estimated calorie and protein needs) -Free water flushes per MD, currently Q4H (provides total free water w/ TF and flushes)   Eugene Gavia, MS, RD, LDN (she/her/hers) RD pager number and weekend/on-call pager number located in Amion.

## 2021-02-24 NOTE — Progress Notes (Signed)
RT called to pt bedside. Pt partially dislodged trach. Pt VS stable upon room entry. RT placed trach back in proper position. RT saw yellow color change on ETCO2 detector. VS stable throughout placement.

## 2021-02-25 LAB — GLUCOSE, CAPILLARY
Glucose-Capillary: 130 mg/dL — ABNORMAL HIGH (ref 70–99)
Glucose-Capillary: 103 mg/dL — ABNORMAL HIGH (ref 70–99)

## 2021-02-25 NOTE — Progress Notes (Signed)
Calorie Count Note  48 hour calorie count ordered Diet: Dysphagia 1, thin liquids Supplements: Ensure Enlive/Plus TID  Day 1 Results (02/23/2021): Breakfast: 0 kcals, 0 grams protein Lunch: 243 kcals, 13 grams protein Dinner: 232 kcals, 12 grams protein Supplements: no documentation provided  Total intake: 475 kcal (29% of minimum estimated needs)  25 grams protein (31% of minimum estimated needs)  Day 2 Results (02/24/2021):  Breakfast: 0 kcals, 0 grams protein Lunch: 27 kcals, 3 grams protein Dinner: no documentation provided Supplements: no documentation provided  Total intake: Inadequate information provided to be able to document total intake from Day 2 of calorie count. Of note, RN today did provide documentation of today's breakfast which provided 70 kcals and 5g protein  It should be noted that pt's intake depends largely on whether a family member is available to assist with feeding as pt does significantly better with intake when a loved one is feeding her vs when a staff member is feeding her. Per pt's husband, pt does very well with strawberry flavored supplements, so RD requested this flavor be sent when available. Unfortunately, staff has not provided specific documentation regarding supplement completion, so unable to speak towards how many kcals/grams of protein pt is obtaining from supplements. Per conversation with pt's husband, can likely assume pt is completing nearly 100% of supplement when strawberry flavor is received.   Given PO intake is still inadequate to meet needs when family is not available to help feed pt, recommend continue nocturnal TF at current rate. Also recommend continue oral nutrition supplement regimen and assisting with meals as needed/tolerated.    Nutrition Dx: Inadequate oral intake related to inability to eat as evidenced by NPO status. -- progressing, pt now on po diet Goal: Patient will meet greater than or equal to 90% of their needs --  progressing  Intervention:  -d/c calorie count -Continue to encourage PO intake & assist as needed -Continue Ensure Enlive po TID -Continue MVI with minerals daily -Continue nocturnal TF via PEG: -Jevity 1.5 @ 18ml/hr for 10 hours from 2000-0600 ( total) -7ml PROSource TF daily       Provides 1165 kcals, 58 grams protein, free water (meets ~73% minimum estimated calorie and protein needs) -Free water flushes per MD, currently Q4H (provides total free water w/ TF and flushes)   Eugene Gavia, MS, RD, LDN (she/her/hers) RD pager number and weekend/on-call pager number located in Amion.

## 2021-02-25 NOTE — Progress Notes (Signed)
[facility] PROGRESS NOTE  Charlene [name]:[id number] DOB: [birth year] DOA: 08/26/2020 PCP: Patient, [name] (Inactive)    October 08, 2020     10/16/2020                        10/16/2020     10/18/2020                        10/20/2020      12/30/2020                   12/30/2020     01/04/2021                   02/24/2021 Viv really needs a teddy bear!   Status: Remains inpatient appropriate because:Altered mental status and Unsafe d/c plan: Requirement to discharge to trach capable SNF   Dispo: The patient is from: Home              Anticipated d/c is to: SNF trach capable-[place] beds available-we have recently discovered that the [facility] may have a trach bed available in clinicals have been sent to the facility for review -trach team re eval 7/25 and rec NOT to decannulate 2/2 and lack of appropriate voluntary and involuntary cough response              Patient currently is medically stable to d/c.   Difficult to place patient Yes   Level of care: Telemetry Medical  Code Status: Full Family Communication: husband Charlene [name] [phone]) is her POA and legal contact.  [name] by phone 8/8 DVT prophylaxis: Chronically bedbound.  5/6: We will discontinue prophylactic Lovenox   Vaccination status: Unvaccinated-husband still undecided.  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  Subsequently on 2/10, she developed obtundation and respiratory arrest leading to intubation.  She has remained persistently obtunded, now has a trach and a PEG.  Hospital course complicated by persistent fever, Serratia pneumonia and now pre-orbital cellulitis  As of 3/25 with adjustments in hypertonicity medications patient began to improve regarding hypertonicity.  Eventually started on provigil with improvements in alertness.  Evaluated by rehab physician who recommended increasing provigil dose further.  Subsequently patient has become more alert.  Most days she is able to track and with encouragement able to follow simple commands especially since partial obstruction glasses placed.  See progress note dictated 4/27 for expanded details.   2/09 Admit for [place] 2/10 EVD placed, later intubated due to obtundation and respiratory arrest 2/11 MRI Brain, hypertonic saline d/c'd, EVD not working.  Husband did not consent to PICC  2/13 EVD removed 2/17 [place] discussion with family, PMT 2/24 Bedside trach and PEG 3/1 Transferred to [facility]  Subjective: No acute issues or events overnight  Objective: Vitals:  02/25/21 0850 02/25/21 1100  BP:  138/90  Pulse: 83 87  Resp: 20 18  Temp:  97.9 F (36.6 C)  SpO2: 100% 100%   Filed Weights   02/21/21 0458 02/22/21 0527 02/25/21 0500  Weight: 60.9 kg 61.6 kg 62.5 kg    Exam:       Gen: Sleeping soundly and appears to be in no acute physical distress Respiratory: #4.0 cuffless trach, trach collar with FiO2 21% at 5L; anterior lung sounds remain clear to auscultation, no tracheal secretions and no increased work of breathing Cardiovascular: S1-S2, normotensive, regular pulse, no peripheral edema Abdomen:  PEG /nocturnal tube feedings. LBM 8/09, soft and nontender.  PEG site unremarkable. Neurological: CN 2 through 12 intact except for  possible hearing loss post stroke.  Moves right upper extremity purposefully strength 4/5. RLE 2/5 with purposeful movement.  Has left-side neglect.   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 -Continues to have positive gag reflex when tested and also strong cough when she has intermittent aspiration while eating and drinking  Constipation -Currently on MiraLAX prn noting if given regularly patient has diarrhea -Last bowel movement documented as 8/1 so we will give one-time dose of Chronulac per tube  Hypertensive emergency/Malignant HTN -Continue Norvasc, beta-blocker and hydralazine.  Dysphagia -Continue dysphagia 1 with thin liquids -Inconsistent oral intake therefore has been resumed on nocturnal tube feedings  History of urinary retention -Continue Cardura    Other problems: Enterobacter cystitis -Urine culture with Enterobacter resistant to cefazolin, cefepime and Septra. -Previously given 2 days of IV Rocephin and since no signs of sepsis was given a one-time dose of fosfomycin over the weekend -Purewick dc'd due to contributing to recurrent UTIs  Right upper extremity thrombophlebitis - [name] -> age indeterminate SVT of right cephalic vein  Neck Contracture/torticollis -- Resolved  Recurrent right knee  pain/known ligamentous injury and chronic osteoarthritis -s/p injection of Marcaine and Depomedrol 6/30 -Continue scheduled Celebrex, Tylenol and Ultram  ESBL positive Enterobacter UTI -Has completed 5 days of meropenem -Contact isolation during hospitalization -continue Oxybutynin suspected bladder spasms  Klebsiella UTI -Resistant to nitrofurantoin and ampicillin -To minimize risk of recurrent UTI have asked that pure wick only be used at bedtime on a 10 PM to 8 AM schedule  Recurrent diarrhea -Resolved  Bacterial Conjunctivitis /periorbital cellulitis -Resolved  Chronic pansinusitis/recurrent fevers 2/2 aspiration PNA;History of Serratia Pneumonia  Hospital Acquired Pneumonia:  -Completed several courses of antibiotics -Follow-up imaging including sinus CT consistent with chronic but stable and resolving sinusitis   Anemia -Stable  HLD -Continue Lipitor      Data Reviewed: Basic Metabolic Panel: No results for input(s): NA, K, CL, CO2, GLUCOSE, BUN, CREATININE, CALCIUM, MG, PHOS in the last 168 hours.    Liver Function Tests: No results for input(s): AST, ALT, ALKPHOS, BILITOT, PROT, ALBUMIN in the last 168 hours.     CBC: No results for input(s): WBC, NEUTROABS, HGB, HCT, MCV, PLT in the last 168 hours.  CBG: Recent Labs  Lab 02/22/21 2345 02/23/21 1223 02/23/21 1818 02/24/21 0627 02/25/21 0658  GLUCAP 151* 93 92 117* 130*      Studies: DG CHEST PORT 1 VIEW  Result Date: 02/24/2021 CLINICAL DATA:  Tracheostomy EXAM: PORTABLE CHEST 1 VIEW COMPARISON:  02/11/2021 FINDINGS: Tracheostomy projects over the mid trachea. Heart and mediastinal contours are within normal limits. No focal opacities or effusions. No acute bony abnormality. IMPRESSION: No active disease. Electronically Signed   By: Rolm Baptise M.D.   On: 02/24/2021 22:25     Scheduled Meds:  acetaminophen  650 mg Per Tube Q6H   amLODipine  10 mg Per Tube Daily   atorvastatin  40  mg Per Tube Daily   baclofen  5 mg Per Tube 3 times per day   carvedilol  25 mg Per Tube BID WC   celecoxib  200 mg Per Tube BID   chlorhexidine gluconate (MEDLINE KIT)  15 mL Mouth Rinse BID   Chlorhexidine Gluconate Cloth  6 each Topical Daily   diclofenac Sodium  4 g Topical QID   doxazosin  2 mg Per Tube Daily   feeding supplement  237 mL Oral TID BM   feeding supplement (JEVITY 1.5 CAL/FIBER)  750 mL Per Tube Q24H   feeding supplement (PROSource TF)  45 mL Per Tube Daily   free water  200 mL Per Tube Q4H   hydrALAZINE  37.5 mg Per Tube Q8H   mouth rinse  15 mL Mouth Rinse 5 X Daily   modafinil  200 mg Per Tube Daily   multivitamin with minerals  1 tablet Per Tube Daily   pantoprazole sodium  40 mg Per Tube Daily   Continuous Infusions:  sodium chloride Stopped (10/26/20 1445)    Principal Problem:   ICH (intracerebral hemorrhage) ([place]) Active Problems:   Acute hypoxemic respiratory failure (HCC)   Tracheostomy dependent ([place])   Palliative care by specialist   Muscle hypertonicity   Oropharyngeal dysphagia   Poor prognosis   Aspiration pneumonia of right lower lobe (HCC)   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  Palliative care.   PCCM continues to follow.  No decannulation.   Procedures: Tracheostomy PEG tube EEG Echocardiogram  Antibiotics: Vancomycin 2/12-2/15 Maxipime 2/12-2/15 Ancef 2/19-2/27 Vancomycin 2/20 x 1 dose Maxipime 2/27- 3/5 Vancomycin 3/1-3/3 Ancef 3/6-3/14 Cipro 3/20 4-3/30 Unasyn 3/30-4/4 Rocephin 4/11-4/12 Meropenem 4/12-4/17   Time spent: 15 minutes    [place] [facility]  Pager: Secure chat 7p - 7a please see Amion  182  Days

## 2021-02-26 LAB — GLUCOSE, CAPILLARY: Glucose-Capillary: 118 mg/dL — ABNORMAL HIGH (ref 70–99)

## 2021-02-26 NOTE — TOC Progression Note (Signed)
Transition of Care [facility]) - Progression Note    Patient Details  Name: Charlene [name] MRN: [id number] Date of Birth: Sep 29, 1965  [facility] CM/SW Contact  Janae Bridgeman, RN Phone Number: 02/26/2021, 2:23 PM  Clinical Narrative:    Case management spoke with Alison Murray, patient's husband this morning and he requests that CM follow up with [facility] and [facility] to review the patient's clinical for possible admission to the facility.  I sent clinicals in the hub to both facilities and left a message with the CM contacts to please the review the clinicals at the family's request to continue to explore SNF facilities in the area for patient placement.   Expected Discharge Plan: Skilled Nursing Facility Barriers to Discharge: Continued Medical Work up  Expected Discharge Plan and Services Expected Discharge Plan: Skilled Nursing Facility In-house Referral: Clinical Social Work Discharge Planning Services: CM Consult Post Acute Care Choice: Skilled Nursing Facility Living arrangements for the past 2 months: Single Family Home                                       Social Determinants of Health (SDOH) Interventions    Readmission Risk Interventions Readmission Risk Prevention Plan 09/29/2020  Transportation Screening Complete  PCP or Specialist Appt within 3-5 Days Complete  HRI or Home Care Consult Complete  Social Work Consult for Recovery Care Planning/Counseling Complete  Palliative Care Screening Complete  Medication Review [employer]) Complete  Some recent data might be hidden

## 2021-02-26 NOTE — Progress Notes (Signed)
[facility] PROGRESS NOTE  Charlene [name]:[id number] DOB: [birth year] DOA: 08/26/2020 PCP: Patient, [name] (Inactive)    October 08, 2020     10/16/2020                        10/16/2020     10/18/2020                        10/20/2020      12/30/2020                   12/30/2020     01/04/2021                   02/24/2021 Viv really needs a teddy bear!   Status: Remains inpatient appropriate because:Altered mental status and Unsafe d/c plan: Requirement to discharge to trach capable SNF   Dispo: The patient is from: Home              Anticipated d/c is to: SNF trach capable[place]-we have recently discovered that the [facility] may have a trach bed available in clinicals have been sent to the facility for review -trach team re eval 7/25 and rec NOT to decannulate 2/2 and lack of appropriate voluntary and involuntary cough response              Patient currently is medically stable to d/c.   Difficult to place patient Yes   Level of care: Telemetry Medical  Code Status: Full Family Communication: [name] [phone]) is her POA and legal contact.  [name] by phone 8/8 DVT prophylaxis: Chronically bedbound.  5/6: We will discontinue prophylactic Lovenox   Vaccination status: Unvaccinated-husband still undecided.  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  Subsequently on 2/10, she developed obtundation and respiratory arrest leading to intubation.  She has remained persistently obtunded, now has a trach and a PEG.  Hospital course complicated by persistent fever, Serratia pneumonia and now pre-orbital cellulitis  As of 3/25 with adjustments in hypertonicity medications patient began to improve regarding hypertonicity.  Eventually started on provigil with improvements in alertness.  Evaluated by rehab physician who recommended increasing provigil dose further.  Subsequently patient has become more alert.  Most days she is able to track and with encouragement able to follow simple commands especially since partial obstruction glasses placed.  See progress note dictated 4/27 for expanded details.   2/09 Admit for [place] 2/10 EVD placed, later intubated due to obtundation and respiratory arrest 2/11 MRI Brain, hypertonic saline d/c'd, EVD not working.  Husband did not consent to PICC  2/13 EVD removed 2/17 [place] discussion with family, PMT 2/24 Bedside trach and PEG 3/1 Transferred to [facility]  Subjective: No acute issues or events overnight  Objective: Vitals:  02/25/21 2300 02/26/21 0400  BP: 110/63   Pulse: 82 88  Resp: 18 18  Temp: 98.1 F (36.7 C)   SpO2: 97% 99%   Filed Weights   02/21/21 0458 02/22/21 0527 02/25/21 0500  Weight: 60.9 kg 61.6 kg 62.5 kg    Exam:       Gen: Sleeping soundly and appears to be in no acute physical distress Respiratory: #4.0 cuffless trach, trach collar with FiO2 21% at 5L; anterior lung sounds remain clear to auscultation, no tracheal secretions and no increased work of breathing Cardiovascular: S1-S2, normotensive, regular pulse, no peripheral edema Abdomen:  PEG /nocturnal tube feedings. LBM 8/09, soft and nontender.  PEG site unremarkable. Neurological: CN 2 through 12 intact except for possible  hearing loss post stroke.  Moves right upper extremity purposefully strength 4/5. RLE 2/5 with purposeful movement.  Has left-side neglect.   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 -Continues to have positive gag reflex when tested and also strong cough when she has intermittent aspiration while eating and drinking  Constipation -Currently on MiraLAX prn noting if given regularly patient has diarrhea -Last bowel movement documented as 8/1 so we will give one-time dose of Chronulac per tube  Hypertensive emergency/Malignant HTN -Continue Norvasc, beta-blocker and hydralazine.  Dysphagia -Continue dysphagia 1 with thin liquids -Inconsistent oral intake therefore has been resumed on nocturnal tube feedings  History of urinary retention -Continue Cardura    Other problems: Enterobacter cystitis -Urine culture with Enterobacter resistant to cefazolin, cefepime and Septra. -Previously given 2 days of IV Rocephin and since no signs of sepsis was given a one-time dose of fosfomycin over the weekend -Purewick dc'd due to contributing to recurrent UTIs  Right upper extremity thrombophlebitis - [name] -> age indeterminate SVT of right cephalic vein  Neck Contracture/torticollis -- Resolved  Recurrent right knee pain/known  ligamentous injury and chronic osteoarthritis -s/p injection of Marcaine and Depomedrol 6/30 -Continue scheduled Celebrex, Tylenol and Ultram  ESBL positive Enterobacter UTI -Has completed 5 days of meropenem -Contact isolation during hospitalization -continue Oxybutynin suspected bladder spasms  Klebsiella UTI -Resistant to nitrofurantoin and ampicillin -To minimize risk of recurrent UTI have asked that pure wick only be used at bedtime on a 10 PM to 8 AM schedule  Recurrent diarrhea -Resolved  Bacterial Conjunctivitis /periorbital cellulitis -Resolved  Chronic pansinusitis/recurrent fevers 2/2 aspiration PNA;History of Serratia Pneumonia  Hospital Acquired Pneumonia:  -Completed several courses of antibiotics -Follow-up imaging including sinus CT consistent with chronic but stable and resolving sinusitis   Anemia -Stable  HLD -Continue Lipitor      Data Reviewed: Basic Metabolic Panel: No results for input(s): NA, K, CL, CO2, GLUCOSE, BUN, CREATININE, CALCIUM, MG, PHOS in the last 168 hours.    Liver Function Tests: No results for input(s): AST, ALT, ALKPHOS, BILITOT, PROT, ALBUMIN in the last 168 hours.     CBC: No results for input(s): WBC, NEUTROABS, HGB, HCT, MCV, PLT in the last 168 hours.  CBG: Recent Labs  Lab 02/23/21 1223 02/23/21 1818 02/24/21 0627 02/25/21 0658 02/25/21 2019  GLUCAP 93 92 117* 130* 103*      Studies: DG CHEST PORT 1 VIEW  Result Date: 02/24/2021 CLINICAL DATA:  Tracheostomy EXAM: PORTABLE CHEST 1 VIEW COMPARISON:  02/11/2021 FINDINGS: Tracheostomy projects over the mid trachea. Heart and mediastinal contours are within normal limits. No focal opacities or effusions. No acute bony abnormality. IMPRESSION: No active disease. Electronically Signed   By: Rolm Baptise M.D.   On: 02/24/2021 22:25     Scheduled Meds:  acetaminophen  650 mg Per Tube Q6H   amLODipine  10 mg Per Tube Daily   atorvastatin  40 mg Per Tube  Daily   baclofen  5 mg Per Tube 3 times per day   carvedilol  25 mg Per Tube BID WC   celecoxib  200 mg Per Tube BID   chlorhexidine gluconate (MEDLINE KIT)  15 mL Mouth Rinse BID   Chlorhexidine Gluconate Cloth  6 each Topical Daily   diclofenac Sodium  4 g Topical QID   doxazosin  2 mg Per Tube Daily   feeding supplement  237 mL Oral TID BM   feeding supplement (JEVITY 1.5 CAL/FIBER)  750 mL Per Tube Q24H   feeding supplement (PROSource TF)  45 mL Per Tube Daily   free water  200 mL Per Tube Q4H   hydrALAZINE  37.5 mg Per Tube Q8H   mouth rinse  15 mL Mouth Rinse 5 X Daily   modafinil  200 mg Per Tube Daily   multivitamin with minerals  1 tablet Per Tube Daily   pantoprazole sodium  40 mg Per Tube Daily   Continuous Infusions:  sodium chloride Stopped (10/26/20 1445)    Principal Problem:   ICH (intracerebral hemorrhage) ([place]) Active Problems:   Acute hypoxemic respiratory failure (HCC)   Tracheostomy dependent ([place])   Palliative care by specialist   Muscle hypertonicity   Oropharyngeal dysphagia   Poor prognosis   Aspiration pneumonia of right lower lobe (HCC)   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  Palliative care.   PCCM continues to follow.  No decannulation.   Procedures: Tracheostomy PEG tube EEG Echocardiogram  Antibiotics: Vancomycin 2/12-2/15 Maxipime 2/12-2/15 Ancef 2/19-2/27 Vancomycin 2/20 x 1 dose Maxipime 2/27- 3/5 Vancomycin 3/1-3/3 Ancef 3/6-3/14 Cipro 3/20 4-3/30 Unasyn 3/30-4/4 Rocephin 4/11-4/12 Meropenem 4/12-4/17   Time spent: 15 minutes    [place] [facility]  Pager: Secure chat 7p - 7a please see Amion  [phone]  Days

## 2021-02-27 NOTE — Progress Notes (Signed)
[facility] PROGRESS NOTE  RICHELLE GLICK WUJ:[id number] DOB: January 31, 1966 DOA: 08/26/2020 PCP: Patient, [name] (Inactive)    October 08, 2020     10/16/2020                        10/16/2020     10/18/2020                        10/20/2020      12/30/2020                   12/30/2020     01/04/2021                   02/24/2021 Viv really needs a teddy bear!   Status: Remains inpatient appropriate because:Altered mental status and Unsafe d/c plan: Requirement to discharge to trach capable SNF   Dispo: The patient is from: Home              Anticipated d/c is to: SNF trach capable-[place] available-we have recently discovered that the [facility] may have a trach bed available in clinicals have been sent to the facility for review -trach team re eval 7/25 and rec NOT to decannulate 2/2 and lack of appropriate voluntary and involuntary cough response              Patient currently is medically stable to d/c.   Difficult to place patient Yes   Level of care: Telemetry Medical  Code Status: Full Family Communication: [name] [phone]) is her POA and legal contact; none available today DVT prophylaxis: Chronically bedbound.  5/6: We will discontinue prophylactic Lovenox   Vaccination status: Unvaccinated-husband still undecided.  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  Subsequently on 2/10, she developed obtundation and respiratory arrest leading to intubation.  She has remained persistently obtunded, now has a trach and a PEG.  Hospital course complicated by persistent fever, Serratia pneumonia and now pre-orbital cellulitis  As of 3/25 with adjustments in hypertonicity medications patient began to improve regarding hypertonicity.  Eventually started on provigil with improvements in alertness.  Evaluated by rehab physician who recommended increasing provigil dose further.  Subsequently patient has become more alert.  Most days she is able to track and with encouragement able to follow simple commands especially since partial obstruction glasses placed.  See progress note dictated 4/27 for expanded details.   2/09 Admit for [place] 2/10 EVD placed, later intubated due to obtundation and respiratory arrest 2/11 MRI Brain, hypertonic saline d/c'd, EVD not working.  Husband did not consent to PICC  2/13 EVD removed 2/17 [place] discussion with family, PMT 2/24 Bedside trach and PEG 3/1 Transferred to [facility]  Subjective: No acute issues or events overnight  Objective: Vitals:  02/27/21 0030 02/27/21 0511  BP:    Pulse:    Resp: 16 18  Temp:    SpO2:  99%   Filed Weights   02/21/21 0458 02/22/21 0527 02/25/21 0500  Weight: 60.9 kg 61.6 kg 62.5 kg    Exam:       Gen: Sleeping soundly and appears to be in no acute physical distress Respiratory: #4.0 cuffless trach, trach collar with FiO2 21% at 5L; anterior lung sounds remain clear to auscultation, no tracheal secretions and no increased work of breathing Cardiovascular: S1-S2, normotensive, regular pulse, no peripheral edema Abdomen:  PEG /nocturnal tube feedings. LBM 8/09, soft and nontender.  PEG site unremarkable. Neurological: CN 2 through 12 intact except for possible hearing loss post stroke.   Moves right upper extremity purposefully strength 4/5. RLE 2/5 with purposeful movement.  Has left-side neglect.   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 -Continues to have positive gag reflex when tested and also strong cough when she has intermittent aspiration while eating and drinking  Constipation -Currently on MiraLAX prn noting if given regularly patient has diarrhea -Last bowel movement documented as 8/1 so we will give one-time dose of Chronulac per tube  Hypertensive emergency/Malignant HTN -Continue Norvasc, beta-blocker and hydralazine.  Dysphagia -Continue dysphagia 1 with thin liquids -Inconsistent oral intake therefore has been resumed on nocturnal tube feedings  History of urinary retention -Continue Cardura    Other problems: Enterobacter cystitis -Urine culture with Enterobacter resistant to cefazolin, cefepime and Septra. -Previously given 2 days of IV Rocephin and since no signs of sepsis was given a one-time dose of fosfomycin over the weekend -Purewick dc'd due to contributing to recurrent UTIs  Right upper extremity thrombophlebitis - [name] -> age indeterminate SVT of right cephalic vein  Neck Contracture/torticollis -- Resolved  Recurrent right knee pain/known ligamentous injury and  chronic osteoarthritis -s/p injection of Marcaine and Depomedrol 6/30 -Continue scheduled Celebrex, Tylenol and Ultram  ESBL positive Enterobacter UTI -Has completed 5 days of meropenem -Contact isolation during hospitalization -continue Oxybutynin suspected bladder spasms  Klebsiella UTI -Resistant to nitrofurantoin and ampicillin -To minimize risk of recurrent UTI have asked that pure wick only be used at bedtime on a 10 PM to 8 AM schedule  Recurrent diarrhea -Resolved  Bacterial Conjunctivitis /periorbital cellulitis -Resolved  Chronic pansinusitis/recurrent fevers 2/2 aspiration PNA;History of Serratia Pneumonia  Hospital Acquired Pneumonia:  -Completed several courses of antibiotics -Follow-up imaging including sinus CT consistent with chronic but stable and resolving sinusitis   Anemia -Stable  HLD -Continue Lipitor      Data Reviewed: Basic Metabolic Panel: No results for input(s): NA, K, CL, CO2, GLUCOSE, BUN, CREATININE, CALCIUM, MG, PHOS in the last 168 hours.    Liver Function Tests: No results for input(s): AST, ALT, ALKPHOS, BILITOT, PROT, ALBUMIN in the last 168 hours.     CBC: No results for input(s): WBC, NEUTROABS, HGB, HCT, MCV, PLT in the last 168 hours.  CBG: Recent Labs  Lab 02/23/21 1818 02/24/21 0627 02/25/21 0658 02/25/21 2019 02/26/21 0636  GLUCAP 92 117* 130* 103* 118*      Studies: No results found.   Scheduled Meds:  acetaminophen  650 mg Per Tube Q6H   amLODipine  10 mg Per Tube Daily   atorvastatin  40 mg Per Tube Daily   baclofen  5 mg Per Tube 3 times per day   carvedilol  25 mg Per Tube BID WC   celecoxib  200 mg Per Tube BID   chlorhexidine gluconate (MEDLINE KIT)  15 mL Mouth Rinse BID   Chlorhexidine Gluconate Cloth  6 each Topical Daily   diclofenac Sodium  4 g Topical QID   doxazosin  2 mg Per Tube Daily   feeding supplement  237 mL Oral TID BM   feeding supplement (JEVITY 1.5 CAL/FIBER)  750 mL  Per Tube Q24H   feeding supplement (PROSource TF)  45 mL Per Tube Daily   free water  200 mL Per Tube Q4H   hydrALAZINE  37.5 mg Per Tube Q8H   mouth rinse  15 mL Mouth Rinse 5 X Daily   modafinil  200 mg Per Tube Daily   multivitamin with minerals  1 tablet Per Tube Daily   pantoprazole sodium  40 mg Per Tube Daily   Continuous Infusions:  sodium chloride Stopped (10/26/20 1445)    Principal Problem:   ICH (intracerebral hemorrhage) ([place]) Active Problems:   Acute hypoxemic respiratory failure (HCC)   Tracheostomy dependent ([place])   Palliative care by specialist   Muscle hypertonicity   Oropharyngeal dysphagia   Poor prognosis   Aspiration pneumonia of right lower lobe (HCC)   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  Palliative care.   PCCM continues to follow.  No decannulation.   Procedures: Tracheostomy PEG tube EEG Echocardiogram  Antibiotics: Vancomycin 2/12-2/15 Maxipime 2/12-2/15 Ancef 2/19-2/27 Vancomycin 2/20 x 1 dose Maxipime 2/27- 3/5 Vancomycin 3/1-3/3 Ancef 3/6-3/14 Cipro 3/20 4-3/30 Unasyn 3/30-4/4 Rocephin 4/11-4/12 Meropenem 4/12-4/17   Time spent: 15 minutes    [facility]  Pager: Secure chat 7p - 7a please see Amion  184  Days

## 2021-02-27 NOTE — Plan of Care (Signed)
  Problem: Coping: Goal: Will identify appropriate support needs Outcome: Progressing   Problem: Health Behavior/Discharge Planning: Goal: Ability to manage health-related needs will improve Outcome: Progressing   Problem: Self-Care: Goal: Verbalization of feelings and concerns over difficulty with self-care will improve Outcome: Progressing Goal: Ability to communicate needs accurately will improve Outcome: Progressing   Problem: Nutrition: Goal: Risk of aspiration will decrease Outcome: Progressing   Problem: Intracerebral Hemorrhage Tissue Perfusion: Goal: Complications of Intracerebral Hemorrhage will be minimized Outcome: Progressing   Problem: Education: Goal: Knowledge of General Education information will improve Description: Including pain rating scale, medication(s)/side effects and non-pharmacologic comfort measures Outcome: Progressing   Problem: Health Behavior/Discharge Planning: Goal: Ability to manage health-related needs will improve Outcome: Progressing   Problem: Clinical Measurements: Goal: Ability to maintain clinical measurements within normal limits will improve Outcome: Progressing Goal: Will remain free from infection Outcome: Progressing Goal: Diagnostic test results will improve Outcome: Progressing Goal: Respiratory complications will improve Outcome: Progressing Goal: Cardiovascular complication will be avoided Outcome: Progressing   Problem: Coping: Goal: Level of anxiety will decrease Outcome: Progressing   Problem: Elimination: Goal: Will not experience complications related to bowel motility Outcome: Progressing Goal: Will not experience complications related to urinary retention Outcome: Progressing   Problem: Pain Managment: Goal: General experience of comfort will improve Outcome: Progressing   Problem: Skin Integrity: Goal: Risk for impaired skin integrity will decrease Outcome: Progressing   Problem: Intracerebral  Hemorrhage Tissue Perfusion: Goal: Complications of Intracerebral Hemorrhage will be minimized Outcome: Progressing   Problem: Ischemic Stroke/TIA Tissue Perfusion: Goal: Complications of ischemic stroke/TIA will be minimized Outcome: Progressing   

## 2021-02-28 NOTE — Progress Notes (Signed)
[facility] PROGRESS NOTE  Charlene [name]:[id number] DOB: January 31, 1966 DOA: 08/26/2020 PCP: Patient, [name] (Inactive)    October 08, 2020     10/16/2020                        10/16/2020     10/18/2020                        10/20/2020      12/30/2020                   12/30/2020     01/04/2021                   02/24/2021 Viv really needs a teddy bear!   Status: Remains inpatient appropriate because:Altered mental status and Unsafe d/c plan: Requirement to discharge to trach capable SNF   Dispo: The patient is from: Home              Anticipated d/c is to: SNF trach capable[place]-we have recently discovered that [facility] may have a trach bed available in clinicals have been sent to the facility for review -trach team re eval 7/25 and rec NOT to decannulate 2/2 and lack of appropriate voluntary and involuntary cough response              Patient currently is medically stable to d/c.   Difficult to place patient Yes   Level of care: Telemetry Medical  Code Status: Full Family Communication: husband Charlene [name] [phone]) is her POA and legal contact; none available today DVT prophylaxis: Chronically bedbound.  5/6: We will discontinue prophylactic Lovenox   Vaccination status: Unvaccinated-husband still undecided.  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  Subsequently on 2/10, she developed obtundation and respiratory arrest leading to intubation.  She has remained persistently obtunded, now has a trach and a PEG.  Hospital course complicated by persistent fever, Serratia pneumonia and now pre-orbital cellulitis  As of 3/25 with adjustments in hypertonicity medications patient began to improve regarding hypertonicity.  Eventually started on provigil with improvements in alertness.  Evaluated by rehab physician who recommended increasing provigil dose further.  Subsequently patient has become more alert.  Most days she is able to track and with encouragement able to follow simple commands especially since partial obstruction glasses placed.  See progress note dictated 4/27 for expanded details.   2/09 Admit for [place] 2/10 EVD placed, later intubated due to obtundation and respiratory arrest 2/11 MRI Brain, hypertonic saline d/c'd, EVD not working.  Husband did not consent to PICC  2/13 EVD removed 2/17 [place] discussion with family, PMT 2/24 Bedside trach and PEG 3/1 Transferred to [facility]  Subjective: No acute issues or events overnight  Objective: Vitals:  02/28/21 0331 02/28/21 0515  BP: 140/79   Pulse: 82 95  Resp: 18 18  Temp: 97.8 F (36.6 C)   SpO2: 99% 98%   Filed Weights   02/22/21 0527 02/25/21 0500 02/28/21 0500  Weight: 61.6 kg 62.5 kg 65.1 kg    Exam:       Gen: Sleeping soundly and appears to be in no acute physical distress Respiratory: #4.0 cuffless trach, trach collar with FiO2 21% at 5L; anterior lung sounds remain clear to auscultation, no tracheal secretions and no increased work of breathing Cardiovascular: S1-S2, normotensive, regular pulse, no peripheral edema Abdomen:  PEG /nocturnal tube feedings. LBM 8/09, soft and nontender.  PEG site unremarkable. Neurological: CN 2 through 12 intact except for possible  hearing loss post stroke.  Moves right upper extremity purposefully strength 4/5. RLE 2/5 with purposeful movement.  Has left-side neglect.   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 -Continues to have positive gag reflex when tested and also strong cough when she has intermittent aspiration while eating and drinking  Constipation -Currently on MiraLAX prn noting if given regularly patient has diarrhea -Last bowel movement documented as 8/1 so we will give one-time dose of Chronulac per tube  Hypertensive emergency/Malignant HTN -Continue Norvasc, beta-blocker and hydralazine.  Dysphagia -Continue dysphagia 1 with thin liquids -Inconsistent oral intake therefore has been resumed on nocturnal tube feedings  History of urinary retention -Continue Cardura    Other problems: Enterobacter cystitis -Urine culture with Enterobacter resistant to cefazolin, cefepime and Septra. -Previously given 2 days of IV Rocephin and since no signs of sepsis was given a one-time dose of fosfomycin over the weekend -Purewick dc'd due to contributing to recurrent UTIs  Right upper extremity thrombophlebitis - [name] -> age indeterminate SVT of right cephalic vein  Neck Contracture/torticollis -- Resolved  Recurrent right knee pain/known  ligamentous injury and chronic osteoarthritis -s/p injection of Marcaine and Depomedrol 6/30 -Continue scheduled Celebrex, Tylenol and Ultram  ESBL positive Enterobacter UTI -Has completed 5 days of meropenem -Contact isolation during hospitalization -continue Oxybutynin suspected bladder spasms  Klebsiella UTI -Resistant to nitrofurantoin and ampicillin -To minimize risk of recurrent UTI have asked that pure wick only be used at bedtime on a 10 PM to 8 AM schedule  Recurrent diarrhea -Resolved  Bacterial Conjunctivitis /periorbital cellulitis -Resolved  Chronic pansinusitis/recurrent fevers 2/2 aspiration PNA;History of Serratia Pneumonia  Hospital Acquired Pneumonia:  -Completed several courses of antibiotics -Follow-up imaging including sinus CT consistent with chronic but stable and resolving sinusitis   Anemia -Stable  HLD -Continue Lipitor      Data Reviewed: Basic Metabolic Panel: No results for input(s): NA, K, CL, CO2, GLUCOSE, BUN, CREATININE, CALCIUM, MG, PHOS in the last 168 hours.    Liver Function Tests: No results for input(s): AST, ALT, ALKPHOS, BILITOT, PROT, ALBUMIN in the last 168 hours.     CBC: No results for input(s): WBC, NEUTROABS, HGB, HCT, MCV, PLT in the last 168 hours.  CBG: Recent Labs  Lab 02/23/21 1818 02/24/21 0627 02/25/21 0658 02/25/21 2019 02/26/21 0636  GLUCAP 92 117* 130* 103* 118*      Studies: No results found.   Scheduled Meds:  acetaminophen  650 mg Per Tube Q6H   amLODipine  10 mg Per Tube Daily   atorvastatin  40 mg Per Tube Daily   baclofen  5 mg Per Tube 3 times per day   carvedilol  25 mg Per Tube BID WC   celecoxib  200 mg Per Tube BID   chlorhexidine gluconate (MEDLINE KIT)  15 mL Mouth Rinse BID   Chlorhexidine Gluconate Cloth  6 each Topical Daily   diclofenac Sodium  4 g Topical QID   doxazosin  2 mg Per Tube Daily   feeding supplement  237 mL Oral TID BM   feeding supplement (JEVITY  1.5 CAL/FIBER)  750 mL Per Tube Q24H   feeding supplement (PROSource TF)  45 mL Per Tube Daily   free water  200 mL Per Tube Q4H   hydrALAZINE  37.5 mg Per Tube Q8H   mouth rinse  15 mL Mouth Rinse 5 X Daily   modafinil  200 mg Per Tube Daily   multivitamin with minerals  1 tablet Per Tube Daily   pantoprazole sodium  40 mg Per Tube Daily   Continuous Infusions:  sodium chloride Stopped (10/26/20 1445)    Principal Problem:   ICH (intracerebral hemorrhage) ([place]) Active Problems:   Acute hypoxemic respiratory failure (HCC)   Tracheostomy dependent ([place])   Palliative care by specialist   Muscle hypertonicity   Oropharyngeal dysphagia   Poor prognosis   Aspiration pneumonia of right lower lobe (HCC)   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  Palliative care.   PCCM continues to follow.  No decannulation.   Procedures: Tracheostomy PEG tube EEG Echocardiogram  Antibiotics: Vancomycin 2/12-2/15 Maxipime 2/12-2/15 Ancef 2/19-2/27 Vancomycin 2/20 x 1 dose Maxipime 2/27- 3/5 Vancomycin 3/1-3/3 Ancef 3/6-3/14 Cipro 3/20 4-3/30 Unasyn 3/30-4/4 Rocephin 4/11-4/12 Meropenem 4/12-4/17   Time spent: 15 minutes    [place] [facility]  Pager: Secure chat 7p - 7a please see Amion  [phone]  Days

## 2021-02-28 NOTE — Progress Notes (Addendum)
71mm [place]iley flex uncuffed.Tracheostomy tube changed at bedside with no complications. RT will continue to monitor

## 2021-02-28 NOTE — Plan of Care (Signed)
  Problem: Coping: Goal: Will identify appropriate support needs Outcome: Progressing   Problem: Health Behavior/Discharge Planning: Goal: Ability to manage health-related needs will improve Outcome: Progressing   Problem: Self-Care: Goal: Verbalization of feelings and concerns over difficulty with self-care will improve Outcome: Progressing Goal: Ability to communicate needs accurately will improve Outcome: Progressing   Problem: Nutrition: Goal: Risk of aspiration will decrease Outcome: Progressing   Problem: Intracerebral Hemorrhage Tissue Perfusion: Goal: Complications of Intracerebral Hemorrhage will be minimized Outcome: Progressing   Problem: Education: Goal: Knowledge of General Education information will improve Description: Including pain rating scale, medication(s)/side effects and non-pharmacologic comfort measures Outcome: Progressing   Problem: Health Behavior/Discharge Planning: Goal: Ability to manage health-related needs will improve Outcome: Progressing   Problem: Clinical Measurements: Goal: Ability to maintain clinical measurements within normal limits will improve Outcome: Progressing Goal: Will remain free from infection Outcome: Progressing Goal: Diagnostic test results will improve Outcome: Progressing Goal: Respiratory complications will improve Outcome: Progressing Goal: Cardiovascular complication will be avoided Outcome: Progressing   Problem: Coping: Goal: Level of anxiety will decrease Outcome: Progressing   Problem: Elimination: Goal: Will not experience complications related to bowel motility Outcome: Progressing Goal: Will not experience complications related to urinary retention Outcome: Progressing   Problem: Pain Managment: Goal: General experience of comfort will improve Outcome: Progressing   Problem: Skin Integrity: Goal: Risk for impaired skin integrity will decrease Outcome: Progressing   Problem: Intracerebral  Hemorrhage Tissue Perfusion: Goal: Complications of Intracerebral Hemorrhage will be minimized Outcome: Progressing   Problem: Ischemic Stroke/TIA Tissue Perfusion: Goal: Complications of ischemic stroke/TIA will be minimized Outcome: Progressing   

## 2021-03-01 LAB — URINALYSIS, COMPLETE (UACMP) WITH MICROSCOPIC
Bacteria, UA: NONE SEEN
Bilirubin Urine: NEGATIVE
Glucose, UA: NEGATIVE mg/dL
Hgb urine dipstick: NEGATIVE
Ketones, ur: NEGATIVE mg/dL
Leukocytes,Ua: NEGATIVE
Nitrite: NEGATIVE
Protein, ur: NEGATIVE mg/dL
Specific Gravity, Urine: 1.011 (ref 1.005–1.030)
pH: 7 (ref 5.0–8.0)

## 2021-03-01 LAB — GLUCOSE, CAPILLARY
Glucose-Capillary: 125 mg/dL — ABNORMAL HIGH (ref 70–99)
Glucose-Capillary: 128 mg/dL — ABNORMAL HIGH (ref 70–99)
Glucose-Capillary: 132 mg/dL — ABNORMAL HIGH (ref 70–99)
Glucose-Capillary: 119 mg/dL — ABNORMAL HIGH (ref 70–99)
Glucose-Capillary: 100 mg/dL — ABNORMAL HIGH (ref 70–99)
Glucose-Capillary: 92 mg/dL (ref 70–99)

## 2021-03-01 MED ORDER — BACLOFEN 1 MG/ML ORAL SUSPENSION
5.0000 mg | Freq: Two times a day (BID) | ORAL | Status: DC
  Administered 2021-03-01 – 2021-03-05 (×8): 5 mg
  Filled 2021-03-01 (×9): qty 5

## 2021-03-01 NOTE — TOC Progression Note (Signed)
Transition of Care [facility] - Progression Note    Patient Details  Name: Charlene [name] MRN: [id number] Date of Birth: [birth year]  Transition of Care [facility]) CM/SW [place], RN Phone Number: 03/01/2021, 2:26 PM  Clinical Narrative:    Case management met with the patient and patient's mother, Charlene [name] at the bedside.  The patient continues to remain hospitalized since the patient's husband, Charlene [name], is unwilling to accept the patient's current bed offer in [place], [place] at [facility] due to the driving distance for family.  [name] has requested that clinicals be faxed out to [facility] care and both [facility] and [facility] were unable to offer bed offers to the patient for admission - since the facilities are unable to provide care due to complex care issues and patient with no insurance coverage at this time with Medicaid and disability pending per the facility.  CM and MSW will continue to follow the patient for SNF placement along with pending court date with [place] courts for pending guardianship.   Expected Discharge Plan: Skilled Nursing Facility Barriers to Discharge: Continued Medical Work up  Expected Discharge Plan and Services Expected Discharge Plan: [place] In-house Referral: Clinical Social Work Discharge Planning Services: CM Consult Post Acute Care Choice: [place] arrangements for the past 2 months: Single Family Home                                       Social Determinants of Health (SDOH) Interventions    Readmission Risk Interventions Readmission Risk Prevention Plan 09/29/2020  Transportation Screening Complete  PCP or Specialist Appt within 3-5 Days Complete  HRI or [place] Complete  Social Work Consult for [place] Planning/Counseling Complete  Palliative Care Screening Complete  Medication Review Human resources officer) Complete  Some recent data might be hidden

## 2021-03-01 NOTE — Progress Notes (Addendum)
[facility] PROGRESS NOTE  Charlene [name] FSF:[id number] DOB: 05-12-1966 DOA: 08/26/2020 PCP: Patient, [name] (Inactive)    October 08, 2020     10/16/2020                        10/16/2020     10/18/2020                        10/20/2020      12/30/2020                   12/30/2020     01/04/2021                   02/24/2021 Viv really needs a teddy bear!   Status: Remains inpatient appropriate because:Altered mental status and Unsafe d/c plan: Requirement to discharge to trach capable SNF   Dispo: The patient is from: Home              Anticipated d/c is to: SNF trach capable-[place]-we have recently discovered that [facility] may have a trach bed available in clinicals have been sent to the facility for review -trach team re eval 7/25 and rec NOT to decannulate 2/2 and lack of appropriate voluntary and involuntary cough response              Patient currently is medically stable to d/c.   Difficult to place patient Yes   Level of care: Telemetry Medical  Code Status: Full Family Communication: husband Charlene [name] [phone]) is her POA and legal contact.  Charlene [name] by phone 8/8.  Mother at bedside 8/15 DVT prophylaxis: Chronically bedbound.  5/6: We will discontinue prophylactic Lovenox   Vaccination status: Unvaccinated-husband still undecided.  Micro Data:  2/9 COVID + Influenza negative 2/10 MRSA PCR negative 2/12 BCx 2 > no growth  2/19 BC 1/2 +Stap EPI, 1/2 no growth  10/07/2020 sputum positive for Serratia marcescens 4/8 Enterobacter cloaca + urine culture MDR sensitive only to imipenem and gentamicin   Antimicrobials:  Cefepime 2/12 > 2/15 Vanc 2/12 > 2/15 10/08/2020 ciprofloxacin>>3/30 Unasyn 3/30 >> 4/02 Rocephin 4/11 > 4/12 Meropenem 4/12>4/17 Meropenem 5/20 through 5/22 Ceftriaxone 5/22 through 5/26 Meropenem 6/3 Ceftriaxone 6/4 through 6/10 Septra 7/4 through 7/7 Ceftriaxone 7/29   HPI: 55 yo F  with 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  Subsequently on 2/10, she developed obtundation and respiratory arrest leading to intubation.  She has remained persistently obtunded, now has a trach and a PEG.  Hospital course complicated by persistent fever, Serratia pneumonia and now pre-orbital cellulitis  As of 3/25 with adjustments in hypertonicity medications patient began to improve regarding hypertonicity.  Eventually started on provigil with improvements in alertness.  Evaluated by rehab physician who recommended increasing provigil dose further.  Subsequently patient has become more alert.  Most days she is able to track and with encouragement able to follow simple commands especially since partial obstruction glasses placed.  See progress note dictated 4/27 for expanded details.   Significant events: 2/09 Admit for ICH 2/10 EVD placed, later intubated due to obtundation and respiratory arrest 2/11 MRI Brain, hypertonic saline d/c'd, EVD not working.  Husband did not consent to PICC  2/13 EVD removed 2/17 [place] discussion with family, PMT 2/24 Bedside trach and PEG 3/1 Transferred to [facility]  Subjective: Awake although  appears to be more sleepy than baseline.  Appears somewhat grumpy today.  Mother at bedside and questions answered  Objective: Vitals:   03/01/21 1132 03/01/21 1148  BP:  (!) 148/91  Pulse:  78  Resp: 18 18  Temp:  98 F (36.7 C)  SpO2:  100%   Filed Weights   02/25/21 0500 02/28/21 0500 03/01/21 0500  Weight: 62.5 kg 65.1 kg 65 kg    Exam:       Gen: Weight but appears more sleepy than baseline. Respiratory: #4.0 cuffless trach, trach collar with FiO2 21% at 5L; anterior lung sounds are clear to auscultation.  No increased work of breathing. Cardiovascular: Heart sounds are normal, she remains normotensive, no peripheral edema. Abdomen:  PEG /nocturnal tube feedings. LBM 8/15,  soft and nontender.  PEG site unremarkable. Neurological: CN 2 through 12 intact except for possible hearing loss post stroke.  Moves right upper extremity purposefully strength 4/5. RLE 2/5 with purposeful movement.  Has left-side neglect.   Psychiatric: Awake but appears to be more sleepy than baseline.  Not attempting to phonate.  When we left the room patient did wave goodbye after prompting from her mother.  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  Mild change in mentation -No clear-cut etiology but as precaution we will decrease frequency of baclofen -As precaution we will check urinalysis and culture to rule out infectious etiology  Tracheostomy dependent -Primary barrier to decannulation is poor/very weak cough reflex -Trach team has had several discussions with patient's husband outlining current limitations for decannulation and uncertainty regarding this would even be possible in the future -At this time is not a candidate for decannulation -7/27 RT performed oral tracheal suction without patient demonstrating cough reflex or gag reflex. -Continues to have positive gag reflex when tested and also strong cough when she has intermittent aspiration while eating and drinking  Constipation -Currently on MiraLAX prn noting if given regularly patient has diarrhea -Last bowel movement documented as 8/1 so we will give one-time dose of Chronulac per tube  Hypertensive emergency/Malignant HTN -Continue Norvasc, beta-blocker and hydralazine.  Dysphagia -Continue dysphagia 1 with thin liquids -Inconsistent oral intake therefore has been resumed on nocturnal tube feedings  History of urinary retention -Continue Cardura    Other  problems: Enterobacter cystitis -Urine culture with Enterobacter resistant to cefazolin, cefepime and Septra. -Previously given 2 days of IV Rocephin and since no signs of sepsis was given a one-time dose of fosfomycin over the weekend -Purewick dc'd due to contributing to recurrent UTIs  Right upper extremity thrombophlebitis - [name] -> age indeterminate SVT of right cephalic vein  Neck Contracture/torticollis -- Resolved  Recurrent right knee pain/known ligamentous injury and chronic osteoarthritis -s/p injection of Marcaine and Depomedrol 6/30 -Continue scheduled Celebrex, Tylenol and Ultram  ESBL positive Enterobacter UTI -Has completed 5 days of meropenem -Contact isolation during hospitalization -continue Oxybutynin suspected bladder spasms  Klebsiella UTI -Resistant to nitrofurantoin and ampicillin -To minimize risk of recurrent UTI have asked that pure wick only be used at bedtime on a 10 PM to 8 AM schedule  Recurrent diarrhea -Resolved  Bacterial Conjunctivitis /periorbital cellulitis -Resolved  Chronic pansinusitis/recurrent fevers 2/2 aspiration PNA;History of Serratia Pneumonia  Hospital Acquired Pneumonia:  -Completed several courses of antibiotics -Follow-up imaging including sinus CT consistent with chronic but  stable and resolving sinusitis   Anemia -Stable  HLD -Continue Lipitor      Data Reviewed: Basic Metabolic Panel: No results for input(s): NA, K, CL, CO2, GLUCOSE, BUN, CREATININE, CALCIUM, MG, PHOS in the last 168 hours.    Liver Function Tests: No results for input(s): AST, ALT, ALKPHOS, BILITOT, PROT, ALBUMIN in the last 168 hours.     CBC: No results for input(s): WBC, NEUTROABS, HGB, HCT, MCV, PLT in the last 168 hours.     CBG: Recent Labs  Lab 02/26/21 0636 02/27/21 0656 02/27/21 2309 02/28/21 0146 03/01/21 1204  GLUCAP 118* 125* 128* 132* 119*     Studies: No results found.   Scheduled Meds:  acetaminophen   650 mg Per Tube Q6H   amLODipine  10 mg Per Tube Daily   atorvastatin  40 mg Per Tube Daily   baclofen  5 mg Per Tube BID   carvedilol  25 mg Per Tube BID WC   celecoxib  200 mg Per Tube BID   chlorhexidine gluconate (MEDLINE KIT)  15 mL Mouth Rinse BID   Chlorhexidine Gluconate Cloth  6 each Topical Daily   diclofenac Sodium  4 g Topical QID   doxazosin  2 mg Per Tube Daily   feeding supplement  237 mL Oral TID BM   feeding supplement (JEVITY 1.5 CAL/FIBER)  750 mL Per Tube Q24H   feeding supplement (PROSource TF)  45 mL Per Tube Daily   free water  200 mL Per Tube Q4H   hydrALAZINE  37.5 mg Per Tube Q8H   mouth rinse  15 mL Mouth Rinse 5 X Daily   modafinil  200 mg Per Tube Daily   multivitamin with minerals  1 tablet Per Tube Daily   pantoprazole sodium  40 mg Per Tube Daily   Continuous Infusions:  sodium chloride Stopped (10/26/20 1445)    Principal Problem:   ICH (intracerebral hemorrhage) ([place]) Active Problems:   Acute hypoxemic respiratory failure (HCC)   Tracheostomy dependent ([place])   Palliative care by specialist   Muscle hypertonicity   Oropharyngeal dysphagia   Poor prognosis   Aspiration pneumonia of right lower lobe (HCC)   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  Palliative care.   PCCM continues to follow.  No decannulation.   Procedures: Tracheostomy PEG tube EEG Echocardiogram  Antibiotics: Vancomycin 2/12-2/15 Maxipime 2/12-2/15 Ancef 2/19-2/27 Vancomycin 2/20 x 1 dose Maxipime 2/27- 3/5 Vancomycin 3/1-3/3 Ancef 3/6-3/14 Cipro 3/20 4-3/30 Unasyn 3/30-4/4 Rocephin 4/11-4/12 Meropenem 4/12-4/17   Time spent: 15 minutes    Erin Hearing ANP  [facility] 7 am - 330 pm/M-F for  direct patient care and secure chat Please refer to [place] for contact info 186  Days

## 2021-03-01 NOTE — Progress Notes (Signed)
   NAME:  Charlene [name], MRN:  [id number], DOB:  Jun 08, 1966, LOS: 186 ADMISSION DATE:  08/26/2020, CONSULTATION DATE:  2/10 REFERRING MD:  Roda Shutters, CHIEF COMPLAINT:  headache   History of Present Illness:  55 year old female admitted with hypertensive R thalamic/basal ganglia ICH. Resultant obstructive hydrocephalus prompting EVD placement 2/10. Subsequently, 2/10 she had acute mental status change to obtundation and respiratory arrest resulting in intubation. Trach and Peg 2/24. Trach change to #6 cuffless on 3/11.   Hospital course complicated by Serratia pneumonia, ESBL UTI, preorbital cellulitis  Pertinent  Medical History  Hypertension, Medication non-compliance  Significant Hospital Events: Including procedures, antibiotic start and stop dates in addition to other pertinent events   2/09 Admit for ICH 2/10 EVD placed, later intubated due to obtundation and respiratory arrest 2/11 MRI Brain, hypertonic saline d/c'd, EVD not working.  Husband did not consent to PICC  2/13 EVD removed 2/17 GOC discussion with family, PMT 2/24 tracheostomy placed by Dr. Denese Killings, PEG placed by Dr. Sheliah Hatch 3/11 trach changed to Shiley 6 uncuffed 3/21 sputum culture with Serratia resistant to Ancef 4/18 28% ATC / 5L  5/08 downsized trach to 4 7/11 No significant secretions / no issues with trach 7/21 Self de-cannulated, trach replaced 7/25 ATC 21%/5L. Using PSV, some speech per SLP, eating 8/1 tolerating PMV, minimal secretions  Interim History / Subjective:   No dyspnea, cough Mom at bedside Remains on 5 L / 21% FiO2 for humidity  Objective   Blood pressure (!) 148/91, pulse 78, temperature 98 F (36.7 C), temperature source Oral, resp. rate 18, height 5\' 5"  (1.651 m), weight 65 kg, last menstrual period 03/03/2014, SpO2 100 %, unknown if currently breastfeeding.    FiO2 (%):  [21 %] 21 %   Intake/Output Summary (Last 24 hours) at 03/01/2021 1601 Last data filed at 03/01/2021 1031 Gross per 24  hour  Intake --  Output 300 ml  Net -300 ml   Filed Weights   02/25/21 0500 02/28/21 0500 03/01/21 0500  Weight: 62.5 kg 65.1 kg 65 kg    Examination: General: no acute distress HEENT: moist mucous membranes, sclera anicteric Neuro: eyes open, spontaneous movement of right arm noted, left sided neglect, tracks with eyes  CV: s1s2 RRR, no m/r/g PULM: clear to auscultation, #4 cuffless GI: soft, bsx4 active  Extremities: Contracture of left arm Skin: no rashes or lesions  Resolved Hospital Problem list     Assessment & Plan:   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 -trach care per protocol  -continue PMV, she is tolerating dysphagia 1 diet, she remains high aspiration risk with coughing episodes with oral intake, although has stronger cough -PT / OT / Speech therapy efforts ongoing - Given her high risk for aspiration, she is not a candidate for decanulation. If placement continues to be an issue, we can reconsider this decision as long as the patient's family is aware of the risks of decannulation and recurrent respiratory failure in setting of aspiration event.  PCCM will follow up again in week of 03/08/21 - call if assistance is needed sooner.  03/10/21, MD [facility] Office: [phone]   See Amion for personal pager PCCM on call pager [phone] until 7pm. Please call Elink 7p-7a. [phone]

## 2021-03-02 LAB — GLUCOSE, CAPILLARY
Glucose-Capillary: 146 mg/dL — ABNORMAL HIGH (ref 70–99)
Glucose-Capillary: 100 mg/dL — ABNORMAL HIGH (ref 70–99)

## 2021-03-02 LAB — URINE CULTURE: Culture: NO GROWTH

## 2021-03-02 NOTE — Progress Notes (Signed)
  Speech Language Pathology Treatment: Cognitive-Linquistic;Dysphagia  Patient Details Name: Charlene [name] MRN: [id number] DOB: 03-30-1966 Today's Date: 03/02/2021 Time: 2094-7096 SLP Time Calculation (min) (ACUTE ONLY): 34 min  Assessment / Plan / Recommendation Clinical Impression  Skilled therapy with husband at bedside focused on swallow, speech intelligibility/vocal intensity and written communication. Pt awake with slight lethargy from recent meds. Eye contact was focused once cued most of the for brief periods. She was distracted by SCD's with perseverative actions throughout session. When able to visualize first letter of last name she was able to complete name without cues as well as husband's first name. Copied the word "no". Connected speech in 1-3 words was low intensity and not intelligible with PMV in place. No back pressure noted when removed. No attempts to mouth or vocalize during singing familiar song.   Charlene [name] drinking juice when arrived with continuous sips,  delayed cough and right side leakage. Encouraged her to take smaller sips; suspect difficulty and delay with initiating and stopping flow. Husband reports oral pocketing intermittently. Continue puree and thin liquids.    HPI HPI: Pt is 55 y/o female presents to [facility] on 2/9 with L sided weakness and headaches. PMH includes anxiety, HTN. 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 S/p Right frontal ventricular catheter placement on 2/10, stopped functioning on 2/11.  MRI 2/11 shows right greater than left mesial temporal lobes / parahippocampal cortex, bilateral basal ganglia, and possibly the upper pons that are concerning for acute infarcts, most likely secondary to mass effect from the hemorrhage. ETT 2/10; trach/PEG 09/10/20.  Trach changed to #6 cuffless 3/11; #4 cuffless 5/4. Has had  two MBS studies, the last on 7/8 - pt started on dysphagia 1, thin liquids. Trach replaced following self-decannulation 7/21.      SLP Plan  Continue with current plan of care       Recommendations  Diet recommendations: Dysphagia 1 (puree);Thin liquid Liquids provided via: Straw Supervision: Staff to assist with self feeding;Trained caregiver to feed patient;Full supervision/cueing for compensatory strategies Compensations: Minimize environmental distractions;Small sips/bites Postural Changes and/or Swallow Maneuvers: Seated upright 90 degrees      Patient may use Passy-Muir Speech Valve: During all waking hours (remove during sleep);During PO intake/meals PMSV Supervision: Intermittent         Oral Care Recommendations: Oral care BID Follow up Recommendations: Skilled Nursing facility SLP Visit Diagnosis: Cognitive communication deficit (R41.841);Dysphagia, unspecified (R13.10) Plan: Continue with current plan of care       GO                Charlene [name] 03/02/2021, 4:54 PM  Charlene [name].Ed Nurse, children's [phone] Office [phone]

## 2021-03-02 NOTE — Plan of Care (Signed)
  Problem: Coping: Goal: Will identify appropriate support needs Outcome: Progressing   

## 2021-03-02 NOTE — Progress Notes (Signed)
Nutrition Follow-up  DOCUMENTATION CODES:  Not applicable  INTERVENTION:  -Continue to encourage PO intake & assist as needed -Continue Ensure Enlive po TID -Continue MVI with minerals daily  Continue nocturnal TF via PEG: -Jevity 1.5 @ 41ml/hr for 10 hours from 2000-0600 ( total) -48ml PROSource TF daily Provides 1165 kcals, 58 grams protein, free water -- meets ~73% minimum estimated calorie and protein needs -Free water flushes per MD, currently Q4H (provides total free water w/ TF and flushes)  NUTRITION DIAGNOSIS:  Inadequate oral intake related to inability to eat as evidenced by NPO status. 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  Subsequently on 2/10, pt developed obtundation and respiratory arrest leading to intubation. Pt is now s/p trach/PEG placement. Hospital course complicated by Serratia pneumonia, ESBL Enterobacter UTI and pre-orbital cellulitis.  2/10 s/p EVD placement and intubation for respiratory arrest 2/13 EVD removed 2/17 palliative care consult; family requesting tx to [facility] 2/24 s/p trach and PEG 3/1 tx to [facility] 3/11 trach changed to shiley #6 cuffless 4/08 pt w/ sepsis 2/2 UTI 4/18 28% ATC/5L 5/2 CIR reevaluated pt and determined she has not made enough improvement for admit; recommended SNF 5/8 trach changed to shiley #4 cuffless 5/23 CCM evaluated and determined pt stilll unable to decannulate due to variable mental status 6/3 trach exchanged, still shiley #4  cuffless 6/30 s/p R knee aspiration and injection 7/3 trach exchanged, still shiley #4 cuffless 7/8 diet advanced to dysphagia 1 with thin liquids 7/17 trach exchanged, still shiley #4 cuffless 7/21 trach replaced after pt self-decannulated  8/01 tolerating PMV, minimal secretions 8/02 PEG replaced after pt removed 8/14 trach exchanged, still shiley #4 cuffless  Per CCM, pt is at increased risk for aspiration event once/if decannulated, but decannulation would improve pt's chances at achieving placement. MD/CCM planning to discuss with pt's husband.   Limited meal documentation since last RD assessment. 2 meals have been documented as 50% completion. Per RN, pt doing well with oral nutrition supplements. Recommend continue current nutrition plan of care. Will request RN staff track meal completions more closely so recommendations regarding nocturnal TF regimen can be provided.   Admission weight: 61.2 kg Current weight: 59.4 kg  UOP: documented x24 hours  Medications: Ensure Enlive/Plus TID, mvi with minerals, protonix Labs reviewed. CBGs 100-146  Current TF: Jevity 1.5 @ 66ml/hr administered over 10 hours (2000-0600) to provide TF per night, 78ml Prosource TF daily, free water Q4H  Diet Order:   Diet Order             DIET - DYS 1 Room service appropriate? No; Fluid consistency: Thin  Diet effective now                  EDUCATION NEEDS:  No education needs have been identified at this time  Skin:  Skin Assessment: Reviewed RN Assessment  Last BM:  8/16  Height:  Ht Readings from Last 1 Encounters:  10/18/20 5\' 5"  (1.651 m)   Weight:  Wt Readings from Last 1 Encounters:  03/02/21 59.4 kg   Ideal Body Weight:  56.8 kg  BMI:  Body mass index is 21.79 kg/m.  Estimated Nutritional Needs:  Kcal:  1600-1800 Protein:  80-90 grams Fluid:  >1.6L/d   Eugene Gavia, MS, RD, LDN (she/her/hers) RD pager number and weekend/on-call pager number  located in Amion.

## 2021-03-02 NOTE — Progress Notes (Signed)
Physical Therapy Treatment Patient Details Name: Charlene [name] MRN: [id number] DOB: 08/31/1965 Today's Date: 03/02/2021    History of Present Illness Pt is 55 y/o female presents to [facility] on 08/26/20 with L sided weakness and headaches. 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 s/p Right frontal ventricular catheter placement on 2/10, stopped functioning on 2/11.  MRI 2/11 shows right greater than left mesial temporal lobes / parahippocampal cortex, bilateral basal ganglia, and possibly the upper pons that are concerning for acute infarcts, most likely secondary to mass effect from the hemorrhage. ETT 2/10-2/24.  Pt with trach and PEG on 09/10/20. Hospital course complicated by persistent fever, Serratia pneumonia and now pre-orbital cellulitis.   Pt self decannulated 02/04/21, trach re-inserted by RT. Pt dislodged PEG, guided replacement 8/2.  PMH includes anxiety, HTN.    PT Comments    Pt alert, becoming more self aware, indicating "bathroom" in a whisper to me before having a BM in the bed.  She is better able to roll on command better to the right, also attempting to sit up and pull up, even seemingly attempting to try to pull to stand once EOB.  [name], her husband showed me where she wrote her name on paper with cues, but much more legible than anything she has attempted to write before.  PT will continue to follow acutely for safe mobility progression.  Follow Up Recommendations  SNF     Equipment Recommendations  Wheelchair (measurements PT);Wheelchair cushion (measurements PT);Hospital bed;Other (comment) (hoyer lift, 18x18 tilt in space WC, air mattress overlay)    Recommendations for Other Services       Precautions / Restrictions Precautions Precautions: Fall Precaution Comments: peg and trach, bil mitts Required Braces or Orthoses: Other  Brace Other Brace: bil. PRAFOs, soft R knee extension splint and soft L elbow extension splint    Mobility  Bed Mobility Overal bed mobility: Needs Assistance Bed Mobility: Rolling;Sidelying to Sit Rolling: Mod assist Sidelying to sit: Max assist   Sit to supine: Total assist   General bed mobility comments: Pt was able to roll to the right today with mod assist.  She reached with her right hand to the rail and pulled her knees (which are preferenced to the right) to the right side of the bed, more difficulty rolling to the left likely due to inattention more than ability.  Max assist to come to sitting EOB mostly at her trunk and legs over EOB.  Total assist to return to supine twice because she was starting to side off the edge of the air mattress.    Transfers Overall transfer level: Needs assistance               General transfer comment: Pt continues to sit EOB and pull towards her husband [name] with her right hand as if she wants to stand up.  She slid too far off the edge of the bed to do it safely the first time.  The second attempt I tried the sara stedy and she could not put her feet flat on the foot rest.  She did hold to the bar with her right hand, but did not come forward to attempt standing when asked.  Ambulation/Gait                 Stairs  Wheelchair Mobility    Modified Rankin (Stroke Patients Only)       Balance Overall balance assessment: Needs assistance Sitting-balance support: Feet supported;Single extremity supported Sitting balance-Leahy Scale: Poor Sitting balance - Comments: Pt required mod A to maintain EOB sitting Postural control: Posterior lean;Left lateral lean Standing balance support: Single extremity supported Standing balance-Leahy Scale: Zero Standing balance comment: attempted unsucessfully today.                            Cognition Arousal/Alertness: Awake/alert Behavior During Therapy: Flat  affect Overall Cognitive Status: Impaired/Different from baseline Area of Impairment: Attention;Following commands;Safety/judgement;Awareness;Problem solving                   Current Attention Level: Sustained   Following Commands: Follows one step commands inconsistently;Follows one step commands with increased time Safety/Judgement: Decreased awareness of safety Awareness: Intellectual (becoming more self aware) Problem Solving: Slow processing;Decreased initiation;Difficulty sequencing;Requires verbal cues;Requires tactile cues General Comments: Pt follows one step commands to the best of her ability over 50% of the time.  She also is starting to say words that are appropriate to the context 10% of the time.  She said bathroom just before having a BM in the bed, she said she wanted [name], her husband to take her (home presumably, but could never get it out).  She is initiating movement to roll to command and is attempting to pull to stand EOB without cueing.      Exercises General Exercises - Lower Extremity Heel Slides: AAROM;Both;20 reps (reciprocally like a bicycle, pt following commands to straighten leg (R more briskly than L))    General Comments        Pertinent Vitals/Pain Pain Assessment: Faces Faces Pain Scale: Hurts little more Pain Location: generalized with mobility Pain Descriptors / Indicators: Grimacing;Guarding Pain Intervention(s): Limited activity within patient's tolerance;Monitored during session;Repositioned    Home Living                      Prior Function            PT Goals (current goals can now be found in the care plan section) Progress towards PT goals: Progressing toward goals    Frequency    Min 1X/week      PT Plan Current plan remains appropriate    Co-evaluation              AM-PAC PT "6 Clicks" Mobility   Outcome Measure  Help needed turning from your back to your side while in a flat bed without using  bedrails?: A Lot Help needed moving from lying on your back to sitting on the side of a flat bed without using bedrails?: Total Help needed moving to and from a bed to a chair (including a wheelchair)?: Total Help needed standing up from a chair using your arms (e.g., wheelchair or bedside chair)?: Total Help needed to walk in hospital room?: Total Help needed climbing 3-5 steps with a railing? : Total 6 Click Score: 7    End of Session   Activity Tolerance: Patient tolerated treatment well Patient left: in bed;Other (comment) (in chair mode)   PT Visit Diagnosis: Hemiplegia and hemiparesis;Other symptoms and signs involving the nervous system (R29.898);Other abnormalities of gait and mobility (R26.89) Hemiplegia - Right/Left: Left Hemiplegia - dominant/non-dominant: Non-dominant Hemiplegia - caused by: Cerebral infarction;Nontraumatic intracerebral hemorrhage     Time: 5397-6734 PT Time Calculation (min) (ACUTE  ONLY): 52 min  Charges:  $Therapeutic Activity: 38-52 mins                     Corinna Capra, PT, DPT  Acute Rehabilitation Ortho Tech Supervisor [phone] pager [phone]) [phone] office

## 2021-03-02 NOTE — Progress Notes (Signed)
[facility] PROGRESS NOTE  TIARE ROHLMAN SWF:[id number] DOB: March 18, 1966 DOA: 08/26/2020 PCP: Patient, [name] (Inactive)    October 08, 2020     10/16/2020                        10/16/2020     10/18/2020                        10/20/2020      12/30/2020                   12/30/2020     01/04/2021                   02/24/2021 Viv really needs a teddy bear!   Status: Remains inpatient appropriate because:Altered mental status and Unsafe d/c plan: Requirement to discharge to trach capable SNF   Dispo: The patient is from: Home              Anticipated d/c is to: SNF trach capable-[place]-we have recently discovered that the [facility] may have a trach bed available in clinicals have been sent to the facility for review -trach team re eval 7/25 and rec NOT to decannulate 2/2 and lack of appropriate voluntary and involuntary cough response              Patient currently is medically stable to d/c.   Difficult to place patient Yes   Level of care: Telemetry Medical  Code Status: Full Family Communication: [name] [phone]) is her POA and legal contact.  [name] by phone 8/8.  Mother at bedside 8/15 DVT prophylaxis: Chronically bedbound.  5/6: We will discontinue prophylactic Lovenox   Vaccination status: Unvaccinated-husband still undecided.  Micro Data:  2/9 COVID + Influenza negative 2/10 MRSA PCR negative 2/12 BCx 2 > no growth  2/19 BC 1/2 +Stap EPI, 1/2 no growth  10/07/2020 sputum positive for Serratia marcescens 4/8 Enterobacter cloaca + urine culture MDR sensitive only to imipenem and gentamicin   Antimicrobials:  Cefepime 2/12 > 2/15 Vanc 2/12 > 2/15 10/08/2020 ciprofloxacin>>3/30 Unasyn 3/30 >> 4/02 Rocephin 4/11 > 4/12 Meropenem 4/12>4/17 Meropenem 5/20 through 5/22 Ceftriaxone 5/22 through 5/26 Meropenem 6/3 Ceftriaxone 6/4 through 6/10 Septra 7/4 through 7/7 Ceftriaxone 7/29 was 1 dose followed  by 1 dose of fosfomycin   HPI: 55 yo F with 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  Subsequently on 2/10, she developed obtundation and respiratory arrest leading to intubation.  She has remained persistently obtunded, now has a trach and a PEG.  Hospital course complicated by persistent fever, Serratia pneumonia and now pre-orbital cellulitis  As of 3/25 with adjustments in hypertonicity medications patient began to improve regarding hypertonicity.  Eventually started on provigil with improvements in alertness.  Evaluated by rehab physician who recommended increasing provigil dose further.  Subsequently patient has become more alert.  Most days she is able to track and with encouragement able to follow simple commands especially since partial obstruction glasses placed.  See progress note dictated 4/27 for expanded details.   Significant events: 2/09 Admit for ICH 2/10 EVD placed, later intubated due to obtundation and respiratory arrest 2/11 MRI Brain, hypertonic saline d/c'd, EVD not working.  Husband did not consent to PICC  2/13 EVD removed 2/17 GOC discussion with family, PMT 2/24 Bedside trach and  PEG 3/1 Transferred to [facility]  Subjective: Awake in room.  Tracking with eyes.  Smiling and nodding appropriately.  No family at bedside.  Objective: Vitals:   03/02/21 0646 03/02/21 0812  BP: 127/75   Pulse:    Resp:  16  Temp:    SpO2:     Filed Weights   02/28/21 0500 03/01/21 0500 03/02/21 0500  Weight: 65.1 kg 65 kg 59.4 kg    Exam:       Gen: Awake, calm and in no acute distress Respiratory: #4.0 cuffless trach, trach collar with FiO2 21% at 5L; anterior lung sounds are clear to auscultation.  Stable without increased work of breathing Cardiovascular: S1-S2, normotensive, no peripheral edema. Abdomen:  PEG /nocturnal tube feedings. LBM 8/16, soft and nontender.  PEG site  unremarkable. Neurological: CN 2 through 12 intact except for possible hearing loss post stroke.  Subtle left side mouth droop when smiles.  Moves right upper extremity purposefully strength 4/5. RLE 2/5 with purposeful movement.  Has left-side neglect.   Psychiatric: Awake, nods to questions asked.  Nonverbal although at times attempts to phonate around trach.  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  Documented she is at increased risk for aspiration event once decannulated especially if has concomitant illness such as influenza.  Plan is to discuss risks and benefits of cannulation with husband.  They understand that placement options would increase with decannulation and this would allow family to continue frequent visitation and one-on-one care.  They are also aware that this cannot be the driving force from their standpoint and choosing decannulation.  At this point since patient has improved they are willing to reintubate if she gets into trouble. -Continues to have positive gag reflex when tested and also strong cough when she has intermittent aspiration while eating and drinking  Constipation -Currently on  MiraLAX prn noting if given regularly patient has diarrhea -Last bowel movement documented as 8/1 so we will give one-time dose of Chronulac per tube  Hypertensive emergency/Malignant HTN -Continue Norvasc, beta-blocker and hydralazine.  Dysphagia -Continue dysphagia 1 with thin liquids -Inconsistent oral intake therefore has been resumed on nocturnal tube feedings  History of urinary retention -Continue Cardura    Other problems: Enterobacter cystitis -Urine culture with Enterobacter resistant to cefazolin, cefepime and Septra. -Previously given 2 days of IV Rocephin and since no signs of sepsis was given a one-time dose of fosfomycin over the weekend -Purewick dc'd due to contributing to recurrent UTIs  Right upper extremity thrombophlebitis - [name] -> age indeterminate SVT of right cephalic vein  Neck Contracture/torticollis -- Resolved  Recurrent right knee pain/known ligamentous injury and chronic osteoarthritis -s/p injection of Marcaine and Depomedrol 6/30 -Continue scheduled Celebrex, Tylenol and Ultram  ESBL positive Enterobacter UTI -Has completed 5 days of meropenem -Contact isolation during hospitalization -continue Oxybutynin suspected bladder spasms  Klebsiella UTI -Resistant to  nitrofurantoin and ampicillin -To minimize risk of recurrent UTI have asked that pure wick only be used at bedtime on a 10 PM to 8 AM schedule  Recurrent diarrhea -Resolved  Bacterial Conjunctivitis /periorbital cellulitis -Resolved  Chronic pansinusitis/recurrent fevers 2/2 aspiration PNA;History of Serratia Pneumonia  Hospital Acquired Pneumonia:  -Completed several courses of antibiotics -Follow-up imaging including sinus CT consistent with chronic but stable and resolving sinusitis   Anemia -Stable  HLD -Continue Lipitor      Data Reviewed: Basic Metabolic Panel: No results for input(s): NA, K, CL, CO2, GLUCOSE, BUN, CREATININE, CALCIUM, MG, PHOS in the last 168  hours.    Liver Function Tests: No results for input(s): AST, ALT, ALKPHOS, BILITOT, PROT, ALBUMIN in the last 168 hours.     CBC: No results for input(s): WBC, NEUTROABS, HGB, HCT, MCV, PLT in the last 168 hours.     CBG: Recent Labs  Lab 02/27/21 2309 02/28/21 0146 03/01/21 1204 03/01/21 1649 03/01/21 2145  GLUCAP 128* 132* 119* 100* 92     Studies: No results found.   Scheduled Meds:  acetaminophen  650 mg Per Tube Q6H   amLODipine  10 mg Per Tube Daily   atorvastatin  40 mg Per Tube Daily   baclofen  5 mg Per Tube BID   carvedilol  25 mg Per Tube BID WC   celecoxib  200 mg Per Tube BID   chlorhexidine gluconate (MEDLINE KIT)  15 mL Mouth Rinse BID   Chlorhexidine Gluconate Cloth  6 each Topical Daily   diclofenac Sodium  4 g Topical QID   doxazosin  2 mg Per Tube Daily   feeding supplement  237 mL Oral TID BM   feeding supplement (JEVITY 1.5 CAL/FIBER)  750 mL Per Tube Q24H   feeding supplement (PROSource TF)  45 mL Per Tube Daily   free water  200 mL Per Tube Q4H   hydrALAZINE  37.5 mg Per Tube Q8H   mouth rinse  15 mL Mouth Rinse 5 X Daily   modafinil  200 mg Per Tube Daily   multivitamin with minerals  1 tablet Per Tube Daily   pantoprazole sodium  40 mg Per Tube Daily   Continuous Infusions:  sodium chloride Stopped (10/26/20 1445)    Principal Problem:   ICH (intracerebral hemorrhage) ([place]) Active Problems:   Acute hypoxemic respiratory failure (HCC)   Tracheostomy dependent ([place])   Palliative care by specialist   Muscle hypertonicity   Oropharyngeal dysphagia   Poor prognosis   Aspiration pneumonia of right lower lobe (HCC)   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  Palliative care.    PCCM continues to follow.  No decannulation.   Procedures: Tracheostomy PEG tube EEG Echocardiogram  Antibiotics: Vancomycin 2/12-2/15 Maxipime 2/12-2/15 Ancef 2/19-2/27 Vancomycin 2/20 x 1 dose Maxipime 2/27- 3/5 Vancomycin 3/1-3/3 Ancef 3/6-3/14 Cipro 3/20 4-3/30 Unasyn 3/30-4/4 Rocephin 4/11-4/12 Meropenem 4/12-4/17   Time spent: 15 minutes    Erin Hearing ANP  [facility] 7 am - 330 pm/M-F for direct patient care and secure chat Please refer to [place] for contact info 187  Days

## 2021-03-03 ENCOUNTER — Inpatient Hospital Stay (HOSPITAL_COMMUNITY): Payer: Medicaid Other

## 2021-03-03 DIAGNOSIS — S72141A Displaced intertrochanteric fracture of right femur, initial encounter for closed fracture: Secondary | ICD-10-CM

## 2021-03-03 LAB — GLUCOSE, CAPILLARY
Glucose-Capillary: 114 mg/dL — ABNORMAL HIGH (ref 70–99)
Glucose-Capillary: 123 mg/dL — ABNORMAL HIGH (ref 70–99)

## 2021-03-03 IMAGING — DX DG CHEST 1V PORT
1 series · 1 of 1 positions shown · non-contrast
Comparison: [DATE]

CLINICAL DATA: Code stroke, cough

EXAM:
PORTABLE CHEST 1 VIEW

[chest]
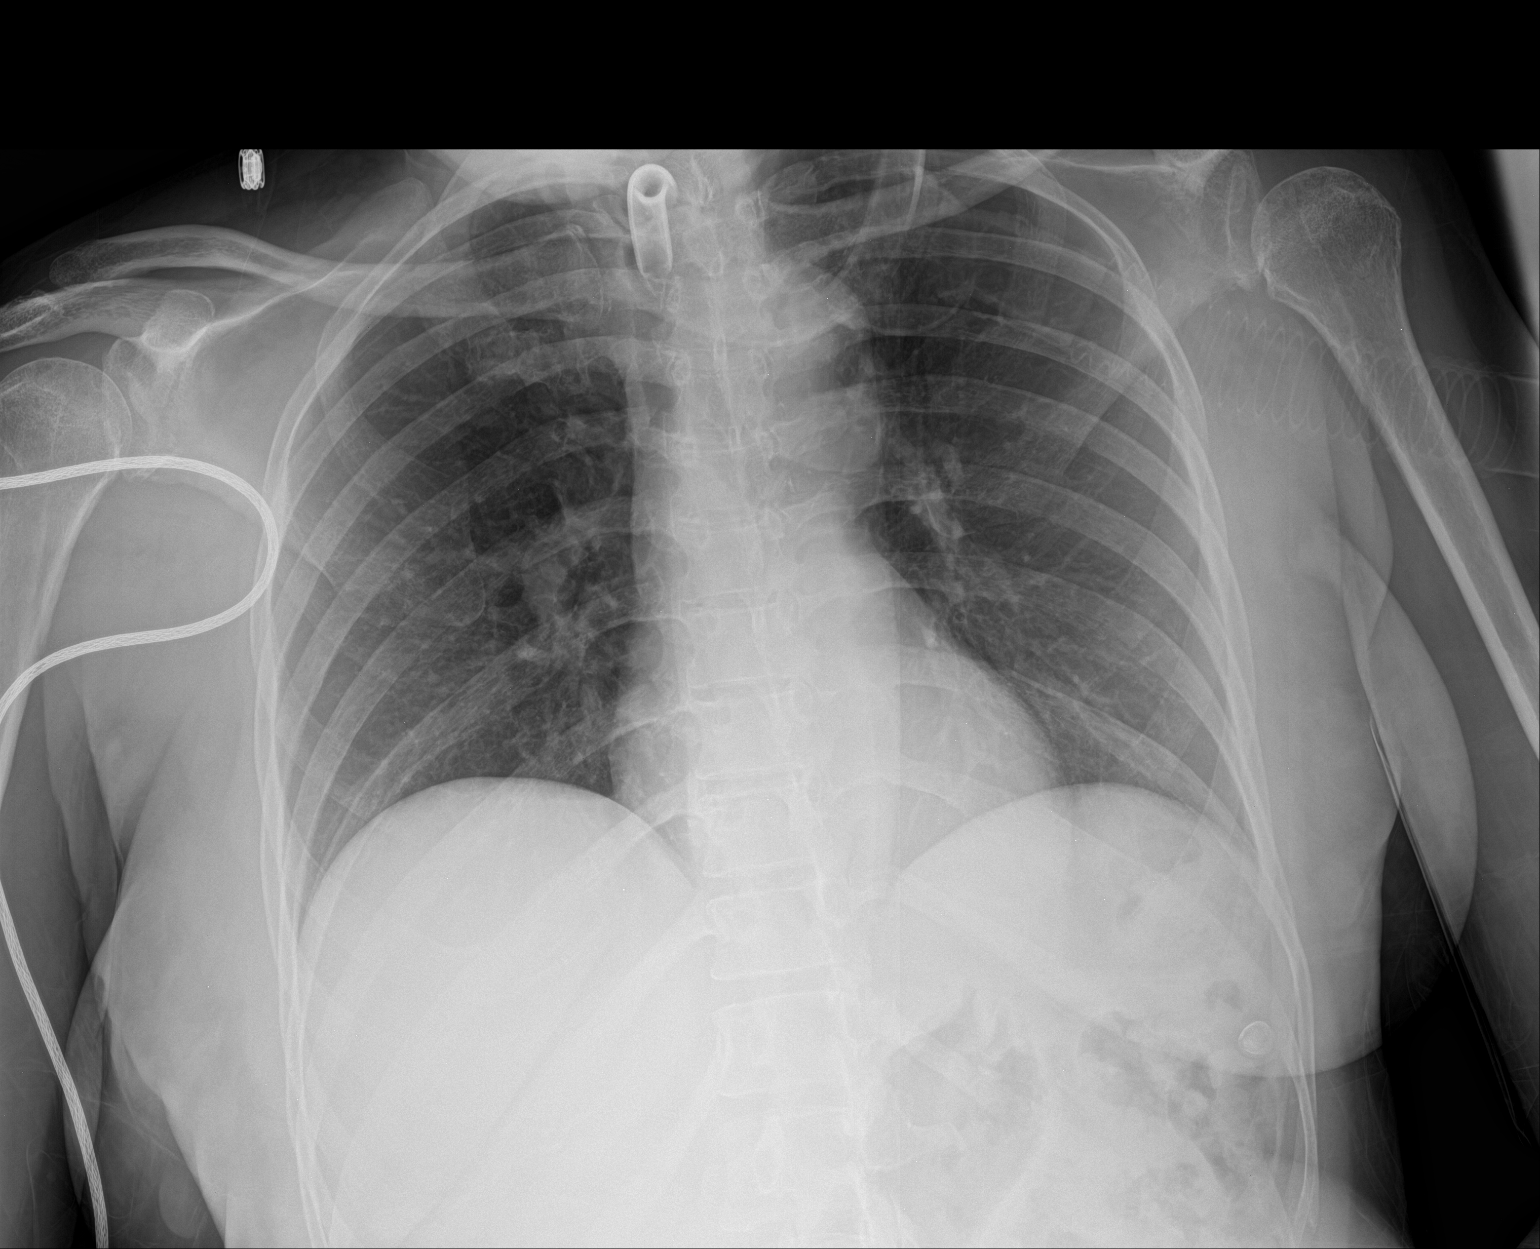

[1 of 1 positions shown; findings below may reference images not displayed]

FINDINGS: Tracheostomy tube tip about 3.6 cm superior to the carina. No focal
opacity or pleural effusion. Normal cardiomediastinal silhouette. No
pneumothorax.
IMPRESSION: No active disease.

## 2021-03-03 NOTE — Progress Notes (Signed)
Occupational Therapy Treatment Patient Details Name: Charlene [name] MRN: [id number] DOB: 1966/02/26 Today's Date: 03/03/2021    History of present illness Pt is 55 y/o female presents to [facility] on 08/26/20 with L sided weakness and headaches. 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 s/p Right frontal ventricular catheter placement on 2/10, stopped functioning on 2/11.  MRI 2/11 shows right greater than left mesial temporal lobes / parahippocampal cortex, bilateral basal ganglia, and possibly the upper pons that are concerning for acute infarcts, most likely secondary to mass effect from the hemorrhage. ETT 2/10-2/24.  Pt with trach and PEG on 09/10/20. Hospital course complicated by persistent fever, Serratia pneumonia and now pre-orbital cellulitis.   Pt self decannulated 02/04/21, trach re-inserted by RT. Pt dislodged PEG, guided replacement 8/2.  PMH includes anxiety, HTN.   OT comments  Patient supine in bed upon entry, noted secretions on gown and R side of mouth. Pt able to follow commands to utilize suction and manage secretions in mouth, wash face with min assist given tactile/verbal cueing for completion of all parts (min assist for thoroughness) using R hand.  Pt soiled in bed, total assist for hygiene from RN and up to mod assist to roll in bed (+2 to complete task).  Donned/doffed gown with max assist, but able to thread R UE into/out of gown with increased time. No verbalizations today, waves to her dad during the session.  Will follow.     Follow Up Recommendations  SNF;Supervision/Assistance - 24 hour    Equipment Recommendations  None recommended by OT    Recommendations for Other Services      Precautions / Restrictions Precautions Precautions: Fall Precaution Comments: peg and trach, R mitt Required Braces or Orthoses: Other Brace Other  Brace: bil. PRAFOs, soft R knee extension splint and soft L elbow extension splint       Mobility Bed Mobility Overal bed mobility: Needs Assistance Bed Mobility: Rolling Rolling: Mod assist         General bed mobility comments: able to roll toward the R with mod assist, max assist to the L side.    Transfers                      Balance                                           ADL either performed or assessed with clinical judgement   ADL Overall ADL's : Needs assistance/impaired     Grooming: Minimal assistance;Wash/dry face Grooming Details (indicate cue type and reason): reaching for wash cloth with R UE, able to wash face with cueing to reach all areas and min assist for thoroughness. pt able to reach and retrieve suction, suction mouth with supervision         Upper Body Dressing : Maximal assistance;Bed level Upper Body Dressing Details (indicate cue type and reason): pt able to thread R UE into/out of gown with increased time, but requires assist for remainer of dressing tasks         Toileting- Clothing Manipulation and Hygiene: Total assistance;+2 for physical assistance;+2 for safety/equipment;Bed level Toileting - Clothing Manipulation Details (indicate cue type and reason): incontinent of urine, total assist for hygiene and rolling with +  2     Functional mobility during ADLs: Total assistance;+2 for physical assistance;+2 for safety/equipment       Vision   Additional Comments: R gaze preference   Perception     Praxis      Cognition Arousal/Alertness: Awake/alert Behavior During Therapy: Flat affect Overall Cognitive Status: Impaired/Different from baseline Area of Impairment: Attention;Following commands;Safety/judgement;Awareness;Problem solving                   Current Attention Level: Sustained   Following Commands: Follows one step commands inconsistently;Follows one step commands with increased  time Safety/Judgement: Decreased awareness of safety;Decreased awareness of deficits Awareness: Intellectual Problem Solving: Slow processing;Decreased initiation;Difficulty sequencing;Requires verbal cues;Requires tactile cues General Comments: pt following 1 step commands in context of task with increased time with 75% accuracy, no verbalizations today.        Exercises Exercises: General Upper Extremity General Exercises - Upper Extremity Shoulder Flexion: PROM;5 reps;Seated;Left Shoulder ABduction: PROM;Left;5 reps Elbow Flexion: PROM;10 reps;Left Elbow Extension: PROM;Left;10 reps;Supine Wrist Flexion: Left;5 reps;PROM;Supine Wrist Extension: PROM;Left;5 reps;Supine   Shoulder Instructions       General Comments pt waved to her father when he came into the room, she donned her glasses with mod assist    Pertinent Vitals/ Pain       Pain Assessment: Faces Faces Pain Scale: Hurts little more Pain Location: generalized with mobility Pain Descriptors / Indicators: Grimacing;Guarding Pain Intervention(s): Limited activity within patient's tolerance;Monitored during session;Repositioned  Home Living                                          Prior Functioning/Environment              Frequency  Min 2X/week        Progress Toward Goals  OT Goals(current goals can now be found in the care plan section)  Progress towards OT goals: Progressing toward goals  Acute Rehab OT Goals Patient Stated Goal: Pt unable to state OT Goal Formulation: Patient unable to participate in goal setting Time For Goal Achievement: 03/17/21 Potential to Achieve Goals: Fair  Plan Discharge plan remains appropriate;Frequency remains appropriate    Co-evaluation                 AM-PAC OT "6 Clicks" Daily Activity     Outcome Measure   Help from another person eating meals?: A Little Help from another person taking care of personal grooming?: A Lot Help from  another person toileting, which includes using toliet, bedpan, or urinal?: Total Help from another person bathing (including washing, rinsing, drying)?: Total Help from another person to put on and taking off regular upper body clothing?: A Lot Help from another person to put on and taking off regular lower body clothing?: Total 6 Click Score: 10    End of Session Equipment Utilized During Treatment: Oxygen  OT Visit Diagnosis: Muscle weakness (generalized) (M62.81);Apraxia (R48.2);Cognitive communication deficit (R41.841);Hemiplegia and hemiparesis Symptoms and signs involving cognitive functions: Other Nontraumatic ICH Hemiplegia - Right/Left: Right Hemiplegia - 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

## 2021-03-03 NOTE — Progress Notes (Signed)
[facility] PROGRESS NOTE  Charlene [name] NOM:[id number] DOB: Sep 01, 1965 DOA: 08/26/2020 PCP: Patient, [name] (Inactive)    October 08, 2020     10/16/2020                        10/16/2020     10/18/2020                        10/20/2020      12/30/2020                   12/30/2020     01/04/2021                   02/24/2021 Viv really needs a teddy bear!   Status: Remains inpatient appropriate because:Altered mental status and Unsafe d/c plan: Requirement to discharge to trach capable SNF   Dispo: The patient is from: Home              Anticipated d/c is to: SNF trach capable[place]-we have recently discovered that the [facility] may have a trach bed available in clinicals have been sent to the facility for review -trach team re eval 7/25 and rec NOT to decannulate 2/2 and lack of appropriate voluntary and involuntary cough response              Patient currently is medically stable to d/c.   Difficult to place patient Yes   Level of care: Telemetry Medical  Code Status: Full Family Communication: husband Charlene [name] [phone]) is her POA and legal contact.  Charlene [name] by phone 8/8.  Mother at bedside 8/15 DVT prophylaxis: Chronically bedbound.  5/6: We will discontinue prophylactic Lovenox   Vaccination status: Unvaccinated-husband still undecided.  Micro Data:  2/9 COVID + Influenza negative 2/10 MRSA PCR negative 2/12 BCx 2 > no growth  2/19 BC 1/2 +Stap EPI, 1/2 no growth  10/07/2020 sputum positive for Serratia marcescens 4/8 Enterobacter cloaca + urine culture MDR sensitive only to imipenem and gentamicin   Antimicrobials:  Cefepime 2/12 > 2/15 Vanc 2/12 > 2/15 10/08/2020 ciprofloxacin>>3/30 Unasyn 3/30 >> 4/02 Rocephin 4/11 > 4/12 Meropenem 4/12>4/17 Meropenem 5/20 through 5/22 Ceftriaxone 5/22 through 5/26 Meropenem 6/3 Ceftriaxone 6/4 through 6/10 Septra 7/4 through 7/7 Ceftriaxone 7/29 was 1 dose followed  by 1 dose of fosfomycin   HPI: 55 yo F with 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  Subsequently on 2/10, she developed obtundation and respiratory arrest leading to intubation.  She has remained persistently obtunded, now has a trach and a PEG.  Hospital course complicated by persistent fever, Serratia pneumonia and now pre-orbital cellulitis  As of 3/25 with adjustments in hypertonicity medications patient began to improve regarding hypertonicity.  Eventually started on provigil with improvements in alertness.  Evaluated by rehab physician who recommended increasing provigil dose further.  Subsequently patient has become more alert.  Most days she is able to track and with encouragement able to follow simple commands especially since partial obstruction glasses placed.  See progress note dictated 4/27 for expanded details.   Significant events: 2/09 Admit for ICH 2/10 EVD placed, later intubated due to obtundation and respiratory arrest 2/11 MRI Brain, hypertonic saline d/c'd, EVD not working.  Husband did not consent to PICC  2/13 EVD removed 2/17 GOC discussion with family, PMT 2/24 Bedside trach and  PEG 3/1 Transferred to [facility]  Subjective: Awake and interactive today.  Smiled as I was talking to her.  Her we were still exploring possibility of removing trach but that the team needed to speak with Charlene [name] directly to explain the risks of removing the trach.  After exam completed patient waved goodbye as I left the room.  Objective: Vitals:   03/02/21 1535 03/02/21 2334  BP: 127/76 128/79  Pulse: 95 83  Resp: 20 18  Temp: 98.6 F (37 C) 98.2 F (36.8 C)  SpO2: 100% 99%   Filed Weights   02/28/21 0500 03/01/21 0500 03/02/21 0500  Weight: 65.1 kg 65 kg 59.4 kg    Exam:       Gen: Awake, calm and in no acute distress Respiratory: #4.0 cuffless trach, trach collar with FiO2 21% at 5L; lungs clear,  stable with normal pulse oximetry readings when checked Cardiovascular: S1-S2, normotensive, regular nontachycardic pulse.  No peripheral edema. Abdomen:  PEG /nocturnal tube feedings. LBM 8/16, soft and nontender.  PEG site unremarkable. Neurological: CN 2 through 12 intact except for possible [name] loss post stroke.  Subtle left side mouth droop when smiles.  Moves right upper extremity purposefully strength 4/5. RLE 2/5 with purposeful movement.  Has left-side neglect.  When working with mother and with therapists has been writing some Psychiatric: Awake, nods to questions asked.  Did not attempt to phonate today.  Nodded in the affirmative when I asked her if she was having difficulty [name].  Assessment/Plan: Acute problems: Multiple acute strokes  Right Thalamus ICH with IVH  Obstructive Hydrocephalus/left-sided neglect /Non TBI related hypertonicity -8/15: We will decrease baclofen to twice daily, continue Provigil and rehabilitative therapies -Suspected stroke related [name] loss -Yesterday while working with therapy and also while mother in room patient was attempting to write on a piece of paper when prompted  Mild change in mentation -No clear-cut etiology but as precaution we will decrease frequency of baclofen -As precaution we will check urinalysis and culture to rule out infectious etiology  Tracheostomy dependent -Primary barrier to decannulation is poor/very weak cough reflex -Trach team has had several discussions with patient's husband outlining current limitations for decannulation and uncertainty regarding this would even be possible in the future -PCCM/trach team following closely regarding candidacy for decannulation.  Documented she is at increased risk for aspiration event once decannulated especially if has concomitant illness such as influenza.  On 8/16 Dr. Erin Fulling with the trach team discussed with patient's husband Charlene [name] regarding risks and benefits for  decannulation.  At this time Charlene [name] thinks the risks outweigh the benefits and would like for Charlene [name] to have more neurological recovery before we consider decannulation. -Continues to have positive gag reflex when tested and also strong cough when she has intermittent aspiration while eating and drinking  Constipation -Currently on MiraLAX prn noting if given regularly patient has diarrhea -Last bowel movement documented as 8/1 so we will give one-time dose of Chronulac per tube  Hypertensive emergency/Malignant HTN -Continue Norvasc, beta-blocker and hydralazine.  Dysphagia -Continue dysphagia 1 with thin liquids -Inconsistent oral intake therefore has been resumed on nocturnal tube feedings  History of urinary retention -Continue Cardura    Other problems: Enterobacter cystitis -Urine culture with Enterobacter resistant to cefazolin, cefepime and Septra. -Previously given 2 days of IV Rocephin and since no signs of sepsis was given a one-time dose of fosfomycin over the weekend -Purewick dc'd due to contributing to recurrent UTIs  Right upper extremity thrombophlebitis - [name] ->  age indeterminate SVT of right cephalic vein  Neck Contracture/torticollis -- Resolved  Recurrent right knee pain/known ligamentous injury and chronic osteoarthritis -s/p injection of Marcaine and Depomedrol 6/30 -Continue scheduled Celebrex, Tylenol and Ultram  ESBL positive Enterobacter UTI -Has completed 5 days of meropenem -Contact isolation during hospitalization -continue Oxybutynin suspected bladder spasms  Klebsiella UTI -Resistant to nitrofurantoin and ampicillin -To minimize risk of recurrent UTI have asked that pure wick only be used at bedtime on a 10 PM to 8 AM schedule  Recurrent diarrhea -Resolved  Bacterial Conjunctivitis /periorbital cellulitis -Resolved  Chronic pansinusitis/recurrent fevers 2/2 aspiration PNA;History of Serratia Pneumonia  Hospital Acquired Pneumonia:   -Completed several courses of antibiotics -Follow-up imaging including sinus CT consistent with chronic but stable and resolving sinusitis   Anemia -Stable  HLD -Continue Lipitor      Data Reviewed: Basic Metabolic Panel: No results for input(s): NA, K, CL, CO2, GLUCOSE, BUN, CREATININE, CALCIUM, MG, PHOS in the last 168 hours.    Liver Function Tests: No results for input(s): AST, ALT, ALKPHOS, BILITOT, PROT, ALBUMIN in the last 168 hours.     CBC: No results for input(s): WBC, NEUTROABS, HGB, HCT, MCV, PLT in the last 168 hours.     CBG: Recent Labs  Lab 03/01/21 1649 03/01/21 2145 03/02/21 0630 03/02/21 1348 03/03/21 0022  GLUCAP 100* 92 146* 100* 123*     Studies: No results found.   Scheduled Meds:  acetaminophen  650 mg Per Tube Q6H   amLODipine  10 mg Per Tube Daily   atorvastatin  40 mg Per Tube Daily   baclofen  5 mg Per Tube BID   carvedilol  25 mg Per Tube BID WC   celecoxib  200 mg Per Tube BID   chlorhexidine gluconate (MEDLINE KIT)  15 mL Mouth Rinse BID   Chlorhexidine Gluconate Cloth  6 each Topical Daily   diclofenac Sodium  4 g Topical QID   doxazosin  2 mg Per Tube Daily   feeding supplement  237 mL Oral TID BM   feeding supplement (JEVITY 1.5 CAL/FIBER)  750 mL Per Tube Q24H   feeding supplement (PROSource TF)  45 mL Per Tube Daily   free water  200 mL Per Tube Q4H   hydrALAZINE  37.5 mg Per Tube Q8H   mouth rinse  15 mL Mouth Rinse 5 X Daily   modafinil  200 mg Per Tube Daily   multivitamin with minerals  1 tablet Per Tube Daily   pantoprazole sodium  40 mg Per Tube Daily   Continuous Infusions:  sodium chloride Stopped (10/26/20 1445)    Principal Problem:   ICH (intracerebral hemorrhage) ([place]) Active Problems:   Acute hypoxemic respiratory failure (HCC)   Tracheostomy dependent ([place])   Palliative care by specialist   Muscle hypertonicity   Oropharyngeal dysphagia   Poor prognosis   Aspiration pneumonia of right  lower lobe (HCC)   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  Palliative care.   PCCM continues to follow.  No decannulation.   Procedures: Tracheostomy PEG tube EEG Echocardiogram  Antibiotics: Vancomycin 2/12-2/15 Maxipime 2/12-2/15 Ancef 2/19-2/27 Vancomycin 2/20 x 1 dose Maxipime 2/27- 3/5 Vancomycin 3/1-3/3 Ancef 3/6-3/14 Cipro 3/20 4-3/30 Unasyn 3/30-4/4 Rocephin 4/11-4/12 Meropenem 4/12-4/17   Time spent: 15 minutes  Charlene [name] ANP  [facility] 7 am - 330 pm/M-F for direct patient care and secure chat Please refer to [place] for contact info 188  Days

## 2021-03-04 LAB — GLUCOSE, CAPILLARY
Glucose-Capillary: 105 mg/dL — ABNORMAL HIGH (ref 70–99)
Glucose-Capillary: 135 mg/dL — ABNORMAL HIGH (ref 70–99)
Glucose-Capillary: 142 mg/dL — ABNORMAL HIGH (ref 70–99)

## 2021-03-04 NOTE — Progress Notes (Signed)
[facility] PROGRESS NOTE  Charlene [name] NOM:[id number] DOB: Sep 01, 1965 DOA: 08/26/2020 PCP: Patient, [name] (Inactive)    October 08, 2020     10/16/2020                        10/16/2020     10/18/2020                        10/20/2020      12/30/2020                   12/30/2020     01/04/2021                   02/24/2021 Viv really needs a teddy bear!   Status: Remains inpatient appropriate because:Altered mental status and Unsafe d/c plan: Requirement to discharge to trach capable SNF   Dispo: The patient is from: Home              Anticipated d/c is to: SNF trach capable-[place] available-we have recently discovered that the [facility] may have a trach bed available in clinicals have been sent to the facility for review -trach team re eval 7/25 and rec NOT to decannulate 2/2 and lack of appropriate voluntary and involuntary cough response              Patient currently is medically stable to d/c.   Difficult to place patient Yes   Level of care: Telemetry Medical  Code Status: Full Family Communication: [name] [phone]) is her POA and legal contact.  [name] by phone 8/8.  Mother at bedside 8/15 DVT prophylaxis: Chronically bedbound.  5/6: We will discontinue prophylactic Lovenox   Vaccination status: Unvaccinated-husband still undecided.  Micro Data:  2/9 COVID + Influenza negative 2/10 MRSA PCR negative 2/12 BCx 2 > no growth  2/19 BC 1/2 +Stap EPI, 1/2 no growth  10/07/2020 sputum positive for Serratia marcescens 4/8 Enterobacter cloaca + urine culture MDR sensitive only to imipenem and gentamicin   Antimicrobials:  Cefepime 2/12 > 2/15 Vanc 2/12 > 2/15 10/08/2020 ciprofloxacin>>3/30 Unasyn 3/30 >> 4/02 Rocephin 4/11 > 4/12 Meropenem 4/12>4/17 Meropenem 5/20 through 5/22 Ceftriaxone 5/22 through 5/26 Meropenem 6/3 Ceftriaxone 6/4 through 6/10 Septra 7/4 through 7/7 Ceftriaxone 7/29 was 1 dose followed  by 1 dose of fosfomycin   HPI: 55 yo F with 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  Subsequently on 2/10, she developed obtundation and respiratory arrest leading to intubation.  She has remained persistently obtunded, now has a trach and a PEG.  Hospital course complicated by persistent fever, Serratia pneumonia and now pre-orbital cellulitis  As of 3/25 with adjustments in hypertonicity medications patient began to improve regarding hypertonicity.  Eventually started on provigil with improvements in alertness.  Evaluated by rehab physician who recommended increasing provigil dose further.  Subsequently patient has become more alert.  Most days she is able to track and with encouragement able to follow simple commands especially since partial obstruction glasses placed.  See progress note dictated 4/27 for expanded details.   Significant events: 2/09 Admit for ICH 2/10 EVD placed, later intubated due to obtundation and respiratory arrest 2/11 MRI Brain, hypertonic saline d/c'd, EVD not working.  Husband did not consent to PICC  2/13 EVD removed 2/17 GOC discussion with family, PMT 2/24 Bedside trach and  PEG 3/1 Transferred [facility]  Subjective: Sleeping soundly and did not awaken during physical exam  Objective: Vitals:   03/04/21 0324 03/04/21 0619  BP: (!) 147/81 (!) 151/99  Pulse: 95   Resp: 15   Temp: 98.4 F (36.9 C)   SpO2: 100%    Filed Weights   03/01/21 0500 03/02/21 0500 03/04/21 0500  Weight: 65 kg 59.4 kg 59.3 kg    Exam:       Gen: Sleeping soundly and appears comfortable. Respiratory: #4.0 cuffless trach, trach collar with FiO2 21% at 5L; lungs clear, stable no significant secretions Cardiovascular: S1-S2, normotensive, regular nontachycardic pulse.  No peripheral edema. Abdomen:  PEG /nocturnal tube feedings. LBM 8/17, soft and nontender.  PEG site unremarkable. Neurological:  Sleeping but at baseline CN 2 through 12 intact except for possible hearing loss post stroke.  Subtle left side mouth droop when smiles.  Moves right upper extremity purposefully strength 4/5. RLE 2/5 with purposeful movement.  Has left-side neglect.  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  Documented she is at increased risk for aspiration event once decannulated especially if has concomitant illness such as influenza.  On 8/16 Dr. Erin Fulling with the trach team discussed with patient's husband [name] regarding risks and benefits for decannulation.  At this time [name] thinks the risks outweigh the benefits and would like for Niasia to have more neurological recovery before we consider decannulation. -Continues to have positive gag reflex when tested and also strong cough when she has intermittent aspiration while eating and drinking  Constipation -Currently on MiraLAX prn noting if given regularly patient has diarrhea -Last bowel movement documented as 8/1 so we will give one-time dose of Chronulac per tube  Hypertensive emergency/Malignant HTN -Continue Norvasc,  beta-blocker and hydralazine.  Dysphagia -Continue dysphagia 1 with thin liquids -Inconsistent oral intake therefore has been resumed on nocturnal tube feedings  History of urinary retention -Continue Cardura    Other problems: Enterobacter cystitis -Urine culture with Enterobacter resistant to cefazolin, cefepime and Septra. -Previously given 2 days of IV Rocephin and since no signs of sepsis was given a one-time dose of fosfomycin over the weekend -Purewick dc'd due to contributing to recurrent UTIs  Right upper extremity thrombophlebitis - [name] -> age indeterminate SVT of right cephalic vein  Neck Contracture/torticollis -- Resolved  Recurrent right knee pain/known ligamentous injury and chronic osteoarthritis -s/p injection of Marcaine and Depomedrol 6/30 -Continue scheduled Celebrex, Tylenol and Ultram  ESBL positive Enterobacter UTI -Has completed 5 days of meropenem -Contact isolation during hospitalization -continue Oxybutynin suspected bladder spasms  Klebsiella UTI -Resistant to nitrofurantoin and ampicillin -To minimize risk of recurrent UTI have asked that pure wick only be used at bedtime on a 10 PM to 8 AM schedule  Recurrent diarrhea -Resolved  Bacterial Conjunctivitis /periorbital cellulitis -  Resolved  Chronic pansinusitis/recurrent fevers 2/2 aspiration PNA;History of Serratia Pneumonia  Hospital Acquired Pneumonia:  -Completed several courses of antibiotics -Follow-up imaging including sinus CT consistent with chronic but stable and resolving sinusitis   Anemia -Stable  HLD -Continue Lipitor      Data Reviewed: Basic Metabolic Panel: No results for input(s): NA, K, CL, CO2, GLUCOSE, BUN, CREATININE, CALCIUM, MG, PHOS in the last 168 hours.    Liver Function Tests: No results for input(s): AST, ALT, ALKPHOS, BILITOT, PROT, ALBUMIN in the last 168 hours.     CBC: No results for input(s): WBC, NEUTROABS, HGB, HCT, MCV, PLT in the last  168 hours.     CBG: Recent Labs  Lab 03/02/21 0630 03/02/21 1348 03/03/21 0022 03/03/21 1220 03/04/21 0015  GLUCAP 146* 100* 123* 114* 142*     Studies: DG CHEST PORT 1 VIEW  Result Date: 03/03/2021 CLINICAL DATA:  Code stroke, cough EXAM: PORTABLE CHEST 1 VIEW COMPARISON:  02/24/2021 FINDINGS: Tracheostomy tube tip about 3.6 cm superior to the carina. No focal opacity or pleural effusion. Normal cardiomediastinal silhouette. No pneumothorax. IMPRESSION: No active disease. Electronically Signed   By: Donavan Foil M.D.   On: 03/03/2021 16:24     Scheduled Meds:  acetaminophen  650 mg Per Tube Q6H   amLODipine  10 mg Per Tube Daily   atorvastatin  40 mg Per Tube Daily   baclofen  5 mg Per Tube BID   carvedilol  25 mg Per Tube BID WC   celecoxib  200 mg Per Tube BID   chlorhexidine gluconate (MEDLINE KIT)  15 mL Mouth Rinse BID   Chlorhexidine Gluconate Cloth  6 each Topical Daily   diclofenac Sodium  4 g Topical QID   doxazosin  2 mg Per Tube Daily   feeding supplement  237 mL Oral TID BM   feeding supplement (JEVITY 1.5 CAL/FIBER)  750 mL Per Tube Q24H   feeding supplement (PROSource TF)  45 mL Per Tube Daily   free water  200 mL Per Tube Q4H   hydrALAZINE  37.5 mg Per Tube Q8H   mouth rinse  15 mL Mouth Rinse 5 X Daily   modafinil  200 mg Per Tube Daily   multivitamin with minerals  1 tablet Per Tube Daily   pantoprazole sodium  40 mg Per Tube Daily   Continuous Infusions:  sodium chloride Stopped (10/26/20 1445)    Principal Problem:   ICH (intracerebral hemorrhage) ([place]) Active Problems:   Acute hypoxemic respiratory failure (HCC)   Tracheostomy dependent ([place])   Palliative care by specialist   Muscle hypertonicity   Oropharyngeal dysphagia   Poor prognosis   Aspiration pneumonia of right lower lobe (HCC)   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   Acute cystitis without hematuria   Ileus,  unspecified (HCC)   Constipation   Knee pain, right   Nonspecific abnormal electrocardiogram (ECG) (EKG)   Consultants: Neurology  Neurosurgery.  Palliative care.   PCCM continues to follow.  No decannulation.   Procedures: Tracheostomy PEG tube EEG Echocardiogram  Antibiotics: Vancomycin 2/12-2/15 Maxipime 2/12-2/15 Ancef 2/19-2/27 Vancomycin 2/20 x 1 dose Maxipime 2/27- 3/5 Vancomycin 3/1-3/3 Ancef 3/6-3/14 Cipro 3/20 4-3/30 Unasyn 3/30-4/4 Rocephin 4/11-4/12 Meropenem 4/12-4/17   Time spent: 15 minutes    Erin Hearing ANP  [facility] 7 am - 330 pm/M-F for direct patient care and secure chat Please refer to [place] for contact info 189  Days

## 2021-03-05 LAB — GLUCOSE, CAPILLARY
Glucose-Capillary: 106 mg/dL — ABNORMAL HIGH (ref 70–99)
Glucose-Capillary: 126 mg/dL — ABNORMAL HIGH (ref 70–99)

## 2021-03-05 MED ORDER — BACLOFEN 1 MG/ML ORAL SUSPENSION
5.0000 mg | Freq: Every day | ORAL | Status: DC
  Administered 2021-03-06 – 2021-04-15 (×41): 5 mg
  Filled 2021-03-05 (×46): qty 5

## 2021-03-05 NOTE — Progress Notes (Addendum)
Pt noted by RN Melissa halfway out of bed, through 18" gap between top and bottom rails on air bed (by design, there is no rail across middle of bed. Staff has been given no apparent or effective mechanism to block this gap), and was lying on and supported by bedside chair. Charge RN Judie Grieve called, and RN Samara Deist. Pt was returned to bed, vitals WNL, suction performed, pt stated pain 0/10 on scale, 0 on PAINAD scale. Skin intact. MD notified. Staff packed several pillows and seizure pad across gap, and looked over unit for other aids. Patient resting comfortably with mother at bedside. Staff alerted pt appears to be increasing activity over long-standing baseline. Will continue to monitor.

## 2021-03-05 NOTE — Progress Notes (Addendum)
[facility] PROGRESS NOTE  [name]:[id number] DOB: [birth year] DOA: 08/26/2020 PCP: Patient, [name] (Inactive)    October 08, 2020     10/16/2020                        10/16/2020     10/18/2020                        10/20/2020      12/30/2020                   12/30/2020     01/04/2021                   02/24/2021 Charlene [name] really needs a teddy bear!   Status: Remains inpatient appropriate because:Altered mental status and Unsafe d/c plan: Requirement to discharge to trach capable SNF   Dispo: The patient is from: Home              Anticipated d/c is to: SNF trach capable-[place]-we have recently discovered that the [facility] may have a trach bed available in clinicals have been sent to the facility for review -trach team re eval 7/25 and rec NOT to decannulate 2/2 and lack of appropriate voluntary and involuntary cough response              Patient currently is medically stable to d/c.   Difficult to place patient Yes   Level of care: Telemetry Medical  Code Status: Full Family Communication: husband Charlene [name] [phone]) is her POA and legal contact.  Charlene [name] by phone 8/19.  Mother at bedside 8/15-Dtr Charlene [name] 8/19 DVT prophylaxis: Chronically bedbound.  5/6: We will discontinue prophylactic Lovenox   Vaccination status: Unvaccinated-husband still undecided.  Micro Data:  2/9 COVID + Influenza negative 2/10 MRSA PCR negative 2/12 BCx 2 > no growth  2/19 BC 1/2 +Stap EPI, 1/2 no growth  10/07/2020 sputum positive for Serratia marcescens 4/8 Enterobacter cloaca + urine culture MDR sensitive only to imipenem and gentamicin   Antimicrobials:  Cefepime 2/12 > 2/15 Vanc 2/12 > 2/15 10/08/2020 ciprofloxacin>>3/30 Unasyn 3/30 >> 4/02 Rocephin 4/11 > 4/12 Meropenem 4/12>4/17 Meropenem 5/20 through 5/22 Ceftriaxone 5/22 through 5/26 Meropenem 6/3 Ceftriaxone 6/4 through 6/10 Septra 7/4 through 7/7 Ceftriaxone 7/29 was  1 dose followed by 1 dose of fosfomycin   HPI: 55 yo F with 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  Subsequently on 2/10, she developed obtundation and respiratory arrest leading to intubation.  She has remained persistently obtunded, now has a trach and a PEG.  Hospital course complicated by persistent fever, Serratia pneumonia and now pre-orbital cellulitis  As of 3/25 with adjustments in hypertonicity medications patient began to improve regarding hypertonicity.  Eventually started on provigil with improvements in alertness.  Evaluated by rehab physician who recommended increasing provigil dose further.  Subsequently patient has become more alert.  Most days she is able to track and with encouragement able to follow simple commands especially since partial obstruction glasses placed.  See progress note dictated 4/27 for expanded details.   Significant events: 2/09 Admit for ICH 2/10 EVD placed, later intubated due to obtundation and respiratory arrest 2/11 MRI Brain, hypertonic saline d/c'd, EVD not working.  Husband did not consent to PICC  2/13 EVD removed 2/17 GOC discussion with family, PMT 2/24 Bedside  trach and PEG 3/1 Transferred to [facility]  Subjective: Patient alert and awake in room.  She is clearly frustrated by the presence of mitt attached to her hand and has been working to try to get the mitten off.  She nodded yes when I asked her if she wanted the sheet off of her and the pillows removed from between her knees.  She also wanted her glasses placed.  I ensured her that her trach ties and her trach collar were not too tight (in the past she has reached for her trach collar and trach tubing due to discomfort from being too tight).  I remove the mittens and she promptly placed her hand between her legs.  I notified her nurse that I had ensured that her trach tubing and ties were not too tight and let  her know I had taken the patient's hand out of the mitt.  Objective: Vitals:   03/05/21 0451 03/05/21 0700  BP:  (!) 148/97  Pulse:  94  Resp:  18  Temp:  98 F (36.7 C)  SpO2: 100%    Filed Weights   03/01/21 0500 03/02/21 0500 03/04/21 0500  Weight: 65 kg 59.4 kg 59.3 kg    Exam:       Gen: Awake and alert and frustrated over mitten on hand. Respiratory: #4.0 cuffless trach, trach collar with FiO2 21% at 5L; lungs clear, no increased work of breathing and no tracheal secretion Cardiovascular: Heart sounds, normotensive, no peripheral edema.  No tachycardia Abdomen:  PEG /nocturnal tube feedings. LBM 8/17, soft and nontender.  PEG site unremarkable. Neurological: CN 2 through 12 intact except for possible hearing loss post stroke.  Subtle left side mouth droop when smiles.  Moves right upper extremity purposefully strength 4/5. RLE 2/5 with purposeful movement.  Has left-side neglect.   Psychiatric: Awake and able to convey desires by mouthing words or nodding yes or no when asked direct questions  Assessment/Plan: Acute problems: Multiple acute strokes  Right Thalamus ICH with IVH  Obstructive Hydrocephalus/left-sided neglect /Non TBI related hypertonicity -8/19 recently decreased baclofen to twice daily.  Earlier today patient quite alert but later in the day and after baclofen dose patient was very sleepy and unable to participate with therapies.  We will go ahead and decrease baclofen to at bedtime only new to follow.  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  Documented she is at increased risk for aspiration even once decannulated (especially if has concomitant illness such as influenza).  On 8/16 Dr. Dewald/PCCM discussed with patient's husband Charlene [name] regarding risks and benefits for decannulation.  At this time Charlene [name] thinks the risks outweigh the benefits and would like for Beckett to have more neurological recovery before we consider decannulation. -Continues to have positive gag reflex when tested and also strong cough when she has intermittent aspiration while eating and drinking  Constipation -Currently on MiraLAX prn noting if given regularly patient has diarrhea -Last bowel movement documented as 8/1 so we will give one-time dose of Chronulac per tube  Hypertensive emergency/Malignant HTN -Continue Norvasc, beta-blocker  and hydralazine.  Dysphagia -Continue dysphagia 1 with thin liquids -Inconsistent oral intake therefore has been resumed on nocturnal tube feedings  History of urinary retention -Continue Cardura    Other problems: Enterobacter cystitis -Urine culture with Enterobacter resistant to cefazolin, cefepime and Septra. -Previously given 2 days of IV Rocephin and since no signs of sepsis was given a one-time dose of fosfomycin over the weekend -Purewick dc'd due to contributing to recurrent UTIs  Right upper extremity thrombophlebitis - [name] -> age indeterminate SVT of right cephalic vein  Neck Contracture/torticollis -- Resolved  Recurrent right knee pain/known ligamentous injury and chronic osteoarthritis -s/p injection of Marcaine and Depomedrol 6/30 -Continue scheduled Celebrex, Tylenol and Ultram  ESBL positive Enterobacter UTI -Has completed 5 days of meropenem -Contact isolation during hospitalization -continue Oxybutynin suspected bladder spasms  Klebsiella UTI -Resistant to nitrofurantoin and ampicillin -To minimize risk  of recurrent UTI have asked that pure wick only be used at bedtime on a 10 PM to 8 AM schedule  Recurrent diarrhea -Resolved  Bacterial Conjunctivitis /periorbital cellulitis -Resolved  Chronic pansinusitis/recurrent fevers 2/2 aspiration PNA;History of Serratia Pneumonia  Hospital Acquired Pneumonia:  -Completed several courses of antibiotics -Follow-up imaging including sinus CT consistent with chronic but stable and resolving sinusitis   Anemia -Stable  HLD -Continue Lipitor      Data Reviewed: Basic Metabolic Panel: No results for input(s): NA, K, CL, CO2, GLUCOSE, BUN, CREATININE, CALCIUM, MG, PHOS in the last 168 hours.    Liver Function Tests: No results for input(s): AST, ALT, ALKPHOS, BILITOT, PROT, ALBUMIN in the last 168 hours.     CBC: No results for input(s): WBC, NEUTROABS, HGB, HCT, MCV, PLT in the last 168 hours.     CBG: Recent Labs  Lab 03/03/21 1220 03/04/21 0015 03/04/21 0620 03/04/21 1701 03/05/21 0028  GLUCAP 114* 142* 135* 105* 126*     Studies: DG CHEST PORT 1 VIEW  Result Date: 03/03/2021 CLINICAL DATA:  Code stroke, cough EXAM: PORTABLE CHEST 1 VIEW COMPARISON:  02/24/2021 FINDINGS: Tracheostomy tube tip about 3.6 cm superior to the carina. No focal opacity or pleural effusion. Normal cardiomediastinal silhouette. No pneumothorax. IMPRESSION: No active disease. Electronically Signed   By: Donavan Foil M.D.   On: 03/03/2021 16:24     Scheduled Meds:  acetaminophen  650 mg Per Tube Q6H   amLODipine  10 mg Per Tube Daily   atorvastatin  40 mg Per Tube Daily   baclofen  5 mg Per Tube BID   carvedilol  25 mg Per Tube BID WC   celecoxib  200 mg Per Tube BID   chlorhexidine gluconate (MEDLINE KIT)  15 mL Mouth Rinse BID   Chlorhexidine Gluconate Cloth  6 each Topical Daily   diclofenac Sodium  4 g Topical QID   doxazosin  2 mg Per Tube Daily   feeding supplement  237 mL Oral TID BM   feeding supplement (JEVITY 1.5 CAL/FIBER)   750 mL Per Tube Q24H   feeding supplement (PROSource TF)  45 mL Per Tube Daily   free water  200 mL Per Tube Q4H   hydrALAZINE  37.5 mg Per Tube Q8H   mouth rinse  15 mL Mouth Rinse 5 X Daily   modafinil  200 mg Per Tube Daily   multivitamin with minerals  1 tablet Per Tube Daily   pantoprazole sodium  40 mg Per Tube Daily   Continuous Infusions:  sodium chloride Stopped (10/26/20 1445)    Principal Problem:  ICH (intracerebral hemorrhage) (HCC) Active Problems:   Acute hypoxemic respiratory failure (HCC)   Tracheostomy dependent (HCC)   Palliative care by specialist   Muscle hypertonicity   Oropharyngeal dysphagia   Poor prognosis   Aspiration pneumonia of right lower lobe (HCC)   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  Palliative care.   PCCM continues to follow.  No decannulation.   Procedures: Tracheostomy PEG tube EEG Echocardiogram  Antibiotics: Vancomycin 2/12-2/15 Maxipime 2/12-2/15 Ancef 2/19-2/27 Vancomycin 2/20 x 1 dose Maxipime 2/27- 3/5 Vancomycin 3/1-3/3 Ancef 3/6-3/14 Cipro 3/20 4-3/30 Unasyn 3/30-4/4 Rocephin 4/11-4/12 Meropenem 4/12-4/17   Time spent: 15 minutes    Erin Hearing ANP  [facility] 7 am - 330 pm/M-F for direct patient care and secure chat Please refer to [place] for contact info 190  Days

## 2021-03-05 NOTE — Plan of Care (Signed)
  Problem: Coping: Goal: Will identify appropriate support needs 03/05/2021 1251 by Drue Dun, RN Outcome: Progressing 03/05/2021 1250 by Drue Dun, RN Outcome: Progressing   Problem: Health Behavior/Discharge Planning: Goal: Ability to manage health-related needs will improve 03/05/2021 1251 by Drue Dun, RN Outcome: Progressing 03/05/2021 1250 by Drue Dun, RN Outcome: Progressing   Problem: Self-Care: Goal: Verbalization of feelings and concerns over difficulty with self-care will improve 03/05/2021 1251 by Drue Dun, RN Outcome: Progressing 03/05/2021 1250 by Drue Dun, RN Outcome: Progressing Goal: Ability to communicate needs accurately will improve 03/05/2021 1251 by Drue Dun, RN Outcome: Progressing 03/05/2021 1250 by Drue Dun, RN Outcome: Progressing   Problem: Nutrition: Goal: Risk of aspiration will decrease 03/05/2021 1251 by Drue Dun, RN Outcome: Progressing 03/05/2021 1250 by Drue Dun, RN Outcome: Progressing   Problem: Intracerebral Hemorrhage Tissue Perfusion: Goal: Complications of Intracerebral Hemorrhage will be minimized 03/05/2021 1251 by Drue Dun, RN Outcome: Progressing 03/05/2021 1250 by Drue Dun, RN Outcome: Progressing   Problem: Education: Goal: Knowledge of General Education information will improve Description: Including pain rating scale, medication(s)/side effects and non-pharmacologic comfort measures 03/05/2021 1251 by Drue Dun, RN Outcome: Progressing 03/05/2021 1250 by Drue Dun, RN Outcome: Progressing   Problem: Health Behavior/Discharge Planning: Goal: Ability to manage health-related needs will improve 03/05/2021 1251 by Drue Dun, RN Outcome: Progressing 03/05/2021 1250 by Drue Dun, RN Outcome: Progressing   Problem: Clinical Measurements: Goal: Ability to maintain clinical measurements within normal limits will  improve 03/05/2021 1251 by Drue Dun, RN Outcome: Progressing 03/05/2021 1250 by Drue Dun, RN Outcome: Progressing Goal: Will remain free from infection 03/05/2021 1251 by Drue Dun, RN Outcome: Progressing 03/05/2021 1250 by Drue Dun, RN Outcome: Progressing Goal: Diagnostic test results will improve 03/05/2021 1251 by Drue Dun, RN Outcome: Progressing 03/05/2021 1250 by Drue Dun, RN Outcome: Progressing Goal: Respiratory complications will improve 03/05/2021 1251 by Drue Dun, RN Outcome: Progressing 03/05/2021 1250 by Drue Dun, RN Outcome: Progressing Goal: Cardiovascular complication will be avoided 03/05/2021 1251 by Drue Dun, RN Outcome: Progressing 03/05/2021 1250 by Drue Dun, RN Outcome: Progressing   Problem: Coping: Goal: Level of anxiety will decrease 03/05/2021 1251 by Drue Dun, RN Outcome: Progressing 03/05/2021 1250 by Drue Dun, RN Outcome: Progressing   Problem: Elimination: Goal: Will not experience complications related to bowel motility 03/05/2021 1251 by Drue Dun, RN Outcome: Progressing 03/05/2021 1250 by Drue Dun, RN Outcome: Progressing Goal: Will not experience complications related to urinary retention 03/05/2021 1251 by Drue Dun, RN Outcome: Progressing 03/05/2021 1250 by Drue Dun, RN Outcome: Progressing   Problem: Pain Managment: Goal: General experience of comfort will improve 03/05/2021 1251 by Drue Dun, RN Outcome: Progressing 03/05/2021 1250 by Drue Dun, RN Outcome: Progressing   Problem: Skin Integrity: Goal: Risk for impaired skin integrity will decrease 03/05/2021 1251 by Drue Dun, RN Outcome: Progressing 03/05/2021 1250 by Drue Dun, RN Outcome: Progressing   Problem: Intracerebral Hemorrhage Tissue Perfusion: Goal: Complications of Intracerebral Hemorrhage will be minimized 03/05/2021  1251 by Drue Dun, RN Outcome: Progressing 03/05/2021 1250 by Drue Dun, RN Outcome: Progressing   Problem: Ischemic Stroke/TIA Tissue Perfusion: Goal: Complications of ischemic stroke/TIA will be minimized 03/05/2021 1251 by Drue Dun, RN Outcome: Progressing 03/05/2021 1250 by Drue Dun, RN Outcome: Progressing

## 2021-03-05 NOTE — Progress Notes (Addendum)
CSW spoke with Diplomatic Services operational officer at the [facility]'s office to confirm the scheduled hearing is for Monday, 03/08/2021 at 3pm.  CSW spoke with patient's husband Charlene [name] to inform him of scheduled hearing.  Edwin Dada, MSW, LCSW Transitions of Care  Clinical Social Worker II [phone]

## 2021-03-05 NOTE — Plan of Care (Signed)
  Problem: Coping: Goal: Will identify appropriate support needs Outcome: Progressing   Problem: Health Behavior/Discharge Planning: Goal: Ability to manage health-related needs will improve Outcome: Progressing   Problem: Self-Care: Goal: Verbalization of feelings and concerns over difficulty with self-care will improve Outcome: Progressing Goal: Ability to communicate needs accurately will improve Outcome: Progressing   Problem: Nutrition: Goal: Risk of aspiration will decrease Outcome: Progressing   Problem: Intracerebral Hemorrhage Tissue Perfusion: Goal: Complications of Intracerebral Hemorrhage will be minimized Outcome: Progressing   Problem: Education: Goal: Knowledge of General Education information will improve Description: Including pain rating scale, medication(s)/side effects and non-pharmacologic comfort measures Outcome: Progressing   Problem: Health Behavior/Discharge Planning: Goal: Ability to manage health-related needs will improve Outcome: Progressing   Problem: Clinical Measurements: Goal: Ability to maintain clinical measurements within normal limits will improve Outcome: Progressing Goal: Will remain free from infection Outcome: Progressing Goal: Diagnostic test results will improve Outcome: Progressing Goal: Respiratory complications will improve Outcome: Progressing Goal: Cardiovascular complication will be avoided Outcome: Progressing   Problem: Coping: Goal: Level of anxiety will decrease Outcome: Progressing   Problem: Elimination: Goal: Will not experience complications related to bowel motility Outcome: Progressing Goal: Will not experience complications related to urinary retention Outcome: Progressing   Problem: Pain Managment: Goal: General experience of comfort will improve Outcome: Progressing   Problem: Skin Integrity: Goal: Risk for impaired skin integrity will decrease Outcome: Progressing   Problem: Intracerebral  Hemorrhage Tissue Perfusion: Goal: Complications of Intracerebral Hemorrhage will be minimized Outcome: Progressing   Problem: Ischemic Stroke/TIA Tissue Perfusion: Goal: Complications of ischemic stroke/TIA will be minimized Outcome: Progressing   

## 2021-03-05 NOTE — Progress Notes (Signed)
CSW met with patient and her mother [name] at bedside. CSW informed [name] of additional attempts to obtain a bed offer from a more local facility, but attempts have been unsuccessful. [name] showed CSW a piece of paper from bedside with several written words stating the patient had written her name with her husband [name]'s assistance. CSW reminded [name] of upcoming court hearing on 03/08/2021 at 3pm. [name] had no questions or concerns to address - CSW encouraged her to reach out if needs arise, she was agreeable.  [name], MSW, LCSW Transitions of Care  Clinical Social Worker II [phone]

## 2021-03-05 NOTE — Progress Notes (Signed)
SLP Cancellation Note  Patient Details Name: Charlene [name] MRN: [id number] DOB: 21-May-1966   Cancelled treatment:   Pt sleepy/lethargic today despite efforts to awaken her.  Will continue to follow.  Brightyn Mozer L. Samson [name], MA CCC/SLP Acute Rehabilitation Services Office number [phone] Pager [phone]         [name] [name] 03/05/2021, 1:36 PM

## 2021-03-06 LAB — GLUCOSE, CAPILLARY: Glucose-Capillary: 98 mg/dL (ref 70–99)

## 2021-03-06 NOTE — Plan of Care (Signed)
  Problem: Coping: Goal: Will identify appropriate support needs Outcome: Progressing   Problem: Health Behavior/Discharge Planning: Goal: Ability to manage health-related needs will improve Outcome: Progressing   Problem: Self-Care: Goal: Verbalization of feelings and concerns over difficulty with self-care will improve Outcome: Progressing Goal: Ability to communicate needs accurately will improve Outcome: Progressing   Problem: Nutrition: Goal: Risk of aspiration will decrease Outcome: Progressing   Problem: Intracerebral Hemorrhage Tissue Perfusion: Goal: Complications of Intracerebral Hemorrhage will be minimized Outcome: Progressing   Problem: Education: Goal: Knowledge of General Education information will improve Description: Including pain rating scale, medication(s)/side effects and non-pharmacologic comfort measures Outcome: Progressing   Problem: Health Behavior/Discharge Planning: Goal: Ability to manage health-related needs will improve Outcome: Progressing   Problem: Clinical Measurements: Goal: Ability to maintain clinical measurements within normal limits will improve Outcome: Progressing Goal: Will remain free from infection Outcome: Progressing Goal: Diagnostic test results will improve Outcome: Progressing Goal: Respiratory complications will improve Outcome: Progressing Goal: Cardiovascular complication will be avoided Outcome: Progressing   Problem: Coping: Goal: Level of anxiety will decrease Outcome: Progressing   Problem: Elimination: Goal: Will not experience complications related to bowel motility Outcome: Progressing Goal: Will not experience complications related to urinary retention Outcome: Progressing   Problem: Pain Managment: Goal: General experience of comfort will improve Outcome: Progressing   Problem: Skin Integrity: Goal: Risk for impaired skin integrity will decrease Outcome: Progressing   Problem: Intracerebral  Hemorrhage Tissue Perfusion: Goal: Complications of Intracerebral Hemorrhage will be minimized Outcome: Progressing   Problem: Ischemic Stroke/TIA Tissue Perfusion: Goal: Complications of ischemic stroke/TIA will be minimized Outcome: Progressing   

## 2021-03-06 NOTE — Progress Notes (Signed)
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  Subsequently on 2/10, she developed obtundation and respiratory arrest leading to intubation. Hypertonic saline d/c'd. EVD not working, and removed on 2/13. She has remained persistently obtunded, now has a trach and a PEG. Eventually started on provigil with improvements in alertness.  Evaluated by rehab physician who recommended increasing provigil dose further.  Subsequently patient has become more alert.  Most days she is able to track and with encouragement able to follow simple commands especially since partial obstruction glasses placed on left eye.  See progress note dictated 4/27 for expanded details. Hospital course complicated by persistent fever, serratia pneumonia and now pre-orbital cellulitis for which she completed treatment. Medically stable for discharge once SNF with trach bed available.  Subjective -sleeping this morning  Assessment & Plan:   Principal problem Nontraumatic TBI/right thalamic ICH with IVH Obstructive hydrocephalus Multiple acute strokes involving corona radiata, right BG and midbrain likely from [place] Acute metabolic encephalopathy -2/42 recently decreased baclofen to twice daily.  Earlier today patient quite alert but later in the day and after baclofen dose patient was very sleepy and unable to participate with therapies.  We will go ahead and decrease baclofen to at bedtime only new to follow.  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  Documented she is at increased risk for aspiration even once decannulated (especially if has concomitant illness such as influenza).  On 8/16 Dr. Dewald/PCCM discussed with patient's husband [name] regarding risks and benefits for decannulation.  At this time [name] thinks the risks outweigh the benefits and would like for Racheal to have more neurological recovery before we consider decannulation. -Continues to have positive gag reflex when tested and also strong cough when she has intermittent aspiration while eating and drinking     Dysphagia -Continue dysphagia 1 with thin liquids -Inconsistent oral intake therefore has been resumed on nocturnal tube feedings   History of urinary retention -Continue Cardura   Enterobacter cystitis -Urine culture with Enterobacter resistant to cefazolin, cefepime and Septra. -Previously given 2 days of IV Rocephin and since no signs of sepsis was given a one-time dose of fosfomycin over the weekend -Purewick dc'd due to contributing to recurrent UTIs   Right upper extremity thrombophlebitis - [name] -> age indeterminate SVT of right cephalic vein   Neck Contracture/torticollis -- Resolved   Recurrent right knee pain/known ligamentous injury and chronic osteoarthritis -s/p injection of Marcaine and Depomedrol 6/30 -Continue scheduled Celebrex, Tylenol and Ultram   ESBL positive Enterobacter UTI -Has completed 5 days of meropenem -Contact isolation during hospitalization -continue Oxybutynin suspected bladder spasms   Klebsiella UTI -Resistant to nitrofurantoin and ampicillin -To minimize risk of recurrent UTI have asked that pure wick only be used at bedtime on a 10 PM to 8  AM schedule   Recurrent diarrhea -Resolved   Bacterial  Conjunctivitis /periorbital cellulitis -Resolved   Chronic pansinusitis/recurrent fevers 2/2 aspiration PNA;History of Serratia Pneumonia  Hospital Acquired Pneumonia:  -Completed several courses of antibiotics -Follow-up imaging including sinus CT consistent with chronic but stable and resolving sinusitis   Anemia -Stable   HLD -Continue Lipitor  DVT prophylaxis: SCD's Code Status: Full Family Communication: no family at bedside   Status is: Inpatient  Remains inpatient appropriate because:Unsafe d/c plan  Dispo: The patient is from: Home              Anticipated d/c is to: SNF              Patient currently is medically stable to d/c. Just awaiting placement   Difficult to place patient Yes  Consultants:  Neurology Pulmonology PM&R Palliative medicine  Procedures:    Antimicrobials: None    Objective: Vitals:   03/06/21 0455 03/06/21 0723 03/06/21 0854 03/06/21 1113  BP:   125/69   Pulse:   96   Resp:  _0 Temp:   99.3 F (37.4 C)   TempSrc:   Axillary   SpO2:      Weight: 68.3 kg     Height:       No intake or output data in the 24 hours ending 03/06/21 1137  Filed Weights   03/02/21 0500 03/04/21 0500 03/06/21 0455  Weight: 59.4 kg 59.3 kg 68.3 kg    Examination: General exam: nad Respiratory system: CTA Cardiovascular system: RRR  Data Reviewed: I have personally reviewed following labs and imaging studies  CBC: No results for input(s): WBC, NEUTROABS, HGB, HCT, MCV, PLT in the last 168 hours.  Basic Metabolic Panel: No results for input(s): NA, K, CL, CO2, GLUCOSE, BUN, CREATININE, CALCIUM, MG, PHOS in the last 168 hours.  GFR: CrCl cannot be calculated (Patient's most recent lab result is older than the maximum 21 days allowed.). Liver Function Tests: No results for input(s): AST, ALT, ALKPHOS, BILITOT, PROT, ALBUMIN in the last 168 hours.  No results for input(s):  LIPASE, AMYLASE in the last 168 hours. No results for input(s): AMMONIA in the last 168 hours. Coagulation Profile: No results for input(s): INR, PROTIME in the last 168 hours. Cardiac Enzymes: No results for input(s): CKTOTAL, CKMB, CKMBINDEX, TROPONINI in the last 168 hours. BNP (last 3 results) No results for input(s): PROBNP in the last 8760 hours. HbA1C: No results for input(s): HGBA1C in the last 72 hours. CBG: Recent Labs  Lab 03/04/21 0620 03/04/21 1701 03/05/21 0028 03/05/21 1215 03/06/21 0013  GLUCAP 135* 105* 126* 106* 98    Lipid Profile: No results for input(s): CHOL, HDL, LDLCALC, TRIG, CHOLHDL, LDLDIRECT in the last 72 hours. Thyroid Function Tests: No results for input(s): TSH, T4TOTAL, FREET4, T3FREE, THYROIDAB in the last 72 hours. Anemia Panel: No results for input(s): VITAMINB12, FOLATE, FERRITIN, TIBC, IRON, RETICCTPCT in the last 72 hours. Sepsis Labs: No results for input(s): PROCALCITON, LATICACIDVEN in the last 168 hours.  Recent Results (from the past 240 hour(s))  Urine Culture     Status: None   Collection Time: 03/01/21  9:11 AM   Specimen: Urine, Catheterized  Result Value Ref Range Status   Specimen Description URINE, CATHETERIZED  Final   Special Requests NONE  Final   Culture   Final    NO GROWTH Performed at [facility], [address] [place], [place] [address]    Report Status 03/02/2021 FINAL  Final     Radiology Studies: No  results found.  Scheduled Meds:  acetaminophen  650 mg Per Tube Q6H   amLODipine  10 mg Per Tube Daily   atorvastatin  40 mg Per Tube Daily   baclofen  5 mg Per Tube QHS   carvedilol  25 mg Per Tube BID WC   celecoxib  200 mg Per Tube BID   chlorhexidine gluconate (MEDLINE KIT)  15 mL Mouth Rinse BID   Chlorhexidine Gluconate Cloth  6 each Topical Daily   diclofenac Sodium  4 g Topical QID   doxazosin  2 mg Per Tube Daily   feeding supplement  237 mL Oral TID BM   feeding supplement (JEVITY  1.5 CAL/FIBER)  750 mL Per Tube Q24H   feeding supplement (PROSource TF)  45 mL Per Tube Daily   free water  200 mL Per Tube Q4H   hydrALAZINE  37.5 mg Per Tube Q8H   mouth rinse  15 mL Mouth Rinse 5 X Daily   modafinil  200 mg Per Tube Daily   multivitamin with minerals  1 tablet Per Tube Daily   pantoprazole sodium  40 mg Per Tube Daily   Continuous Infusions:  sodium chloride Stopped (10/26/20 1445)     LOS: 191 days   Marzetta Board, MD [facility] Pager On Amion  If 7PM-7AM, please contact night-coverage 03/06/2021, 11:37 AM

## 2021-03-07 NOTE — Progress Notes (Signed)
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  Subsequently on 2/10, she developed obtundation and respiratory arrest leading to intubation. Hypertonic saline d/c'd. EVD not working, and removed on 2/13. She has remained persistently obtunded, now has a trach and a PEG. Eventually started on provigil with improvements in alertness.  Evaluated by rehab physician who recommended increasing provigil dose further.  Subsequently patient has become more alert.  Most days she is able to track and with encouragement able to follow simple commands especially since partial obstruction glasses placed on left eye.  See progress note dictated 4/27 for expanded details. Hospital course complicated by persistent fever, serratia pneumonia and now pre-orbital cellulitis for which she completed treatment. Medically stable for discharge once SNF with trach bed available.  Subjective -Awake, does not answer my questions but seems to be tracking me appropriately  Assessment & Plan:   Principal problem Nontraumatic TBI/right thalamic ICH with IVH Obstructive hydrocephalus Multiple acute strokes involving corona radiata, right BG and midbrain likely from [place] Acute metabolic encephalopathy -1/76 recently decreased baclofen to twice daily.  Earlier today patient quite alert but later in the day and after baclofen dose patient was very sleepy and unable to participate with therapies.  We will go ahead and decrease baclofen to at bedtime only new to follow.  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  Documented she is at increased risk for aspiration even once decannulated (especially if has concomitant illness such as influenza).  On 8/16 Dr. Dewald/PCCM discussed with patient's husband [name] regarding risks and benefits for decannulation.  At this time [name] thinks the risks outweigh the benefits and would like for Anavi to have more neurological recovery before we consider decannulation. -Continues to have positive gag reflex when tested and also strong cough when she has intermittent aspiration while eating and drinking     Dysphagia -Continue dysphagia 1 with thin liquids -Inconsistent oral intake therefore has been resumed on nocturnal tube feedings   History of urinary retention -Continue Cardura   Enterobacter cystitis -Urine culture with Enterobacter resistant to cefazolin, cefepime and Septra. -Previously given 2 days of IV Rocephin and since no signs of sepsis was given a one-time dose of fosfomycin over the weekend -Purewick dc'd due to contributing to recurrent UTIs   Right upper extremity thrombophlebitis - [name] -> age indeterminate SVT of right cephalic vein   Neck Contracture/torticollis -- Resolved   Recurrent right knee pain/known ligamentous injury and chronic osteoarthritis -s/p injection of Marcaine and Depomedrol 6/30 -Continue scheduled Celebrex, Tylenol and Ultram   ESBL positive Enterobacter UTI -Has completed 5 days of meropenem -Contact isolation during hospitalization -continue Oxybutynin suspected bladder spasms   Klebsiella UTI -Resistant to nitrofurantoin and ampicillin -To minimize risk of recurrent UTI have asked that  pure wick only be used at bedtime on a 10 PM to 8  AM schedule   Recurrent diarrhea -Resolved   Bacterial Conjunctivitis /periorbital cellulitis -Resolved   Chronic pansinusitis/recurrent fevers 2/2 aspiration PNA;History of Serratia Pneumonia  Hospital Acquired Pneumonia:  -Completed several courses of antibiotics -Follow-up imaging including sinus CT consistent with chronic but stable and resolving sinusitis   Anemia -Stable   HLD -Continue Lipitor  DVT prophylaxis: SCD's Code Status: Full Family Communication: no family at bedside   Status is: Inpatient  Remains inpatient appropriate because:Unsafe d/c plan  Dispo: The patient is from: Home              Anticipated d/c is to: SNF              Patient currently is medically stable to d/c. Just awaiting placement   Difficult to place patient Yes  Consultants:  Neurology Pulmonology PM&R Palliative medicine  Procedures:    Antimicrobials: None    Objective: Vitals:   03/07/21 0409 03/07/21 0500 03/07/21 0753 03/07/21 0807  BP: (!) 151/88  (!) 153/89 (!) 153/89  Pulse: 95  96 94  Resp: _0 Temp: 98.4 F (36.9 C)  98.3 F (36.8 C)   TempSrc: Oral  Oral   SpO2: 100%  100% 96%  Weight:  65.3 kg    Height:        Intake/Output Summary (Last 24 hours) at 03/07/2021 1022 Last data filed at 03/07/2021 0700 Gross per 24 hour  Intake 810 ml  Output 500 ml  Net 310 ml    Filed Weights   03/04/21 0500 03/06/21 0455 03/07/21 0500  Weight: 59.3 kg 68.3 kg 65.3 kg    Examination: General exam: NAD Respiratory system: CTA Cardiovascular system: RRR  Data Reviewed: I have personally reviewed following labs and imaging studies  CBC: No results for input(s): WBC, NEUTROABS, HGB, HCT, MCV, PLT in the last 168 hours.  Basic Metabolic Panel: No results for input(s): NA, K, CL, CO2, GLUCOSE, BUN, CREATININE, CALCIUM, MG, PHOS in the last 168 hours.  GFR: CrCl cannot be calculated (Patient's most  recent lab result is older than the maximum 21 days allowed.). Liver Function Tests: No results for input(s): AST, ALT, ALKPHOS, BILITOT, PROT, ALBUMIN in the last 168 hours.  No results for input(s): LIPASE, AMYLASE in the last 168 hours. No results for input(s): AMMONIA in the last 168 hours. Coagulation Profile: No results for input(s): INR, PROTIME in the last 168 hours. Cardiac Enzymes: No results for input(s): CKTOTAL, CKMB, CKMBINDEX, TROPONINI in the last 168 hours. BNP (last 3 results) No results for input(s): PROBNP in the last 8760 hours. HbA1C: No results for input(s): HGBA1C in the last 72 hours. CBG: Recent Labs  Lab 03/04/21 0620 03/04/21 1701 03/05/21 0028 03/05/21 1215 03/06/21 0013  GLUCAP 135* 105* 126* 106* 98    Lipid Profile: No results for input(s): CHOL, HDL, LDLCALC, TRIG, CHOLHDL, LDLDIRECT in the last 72 hours. Thyroid Function Tests: No results for input(s): TSH, T4TOTAL, FREET4, T3FREE, THYROIDAB in the last 72 hours. Anemia Panel: No results for input(s): VITAMINB12, FOLATE, FERRITIN, TIBC, IRON, RETICCTPCT in the last 72 hours. Sepsis Labs: No results for input(s): PROCALCITON, LATICACIDVEN in the last 168 hours.  Recent Results (from the past 240 hour(s))  Urine Culture     Status: None   Collection Time: 03/01/21  9:11 AM   Specimen: Urine, Catheterized  Result Value Ref Range Status   Specimen Description URINE, CATHETERIZED  Final   Special Requests NONE  Final   Culture  Final    NO GROWTH Performed at [facility], [place] [address], [place], [place] [address]    Report Status 03/02/2021 FINAL  Final     Radiology Studies: No results found.  Scheduled Meds:  acetaminophen  650 mg Per Tube Q6H   amLODipine  10 mg Per Tube Daily   atorvastatin  40 mg Per Tube Daily   baclofen  5 mg Per Tube QHS   carvedilol  25 mg Per Tube BID WC   celecoxib  200 mg Per Tube BID   chlorhexidine gluconate (MEDLINE KIT)  15 mL Mouth Rinse  BID   Chlorhexidine Gluconate Cloth  6 each Topical Daily   diclofenac Sodium  4 g Topical QID   doxazosin  2 mg Per Tube Daily   feeding supplement  237 mL Oral TID BM   feeding supplement (JEVITY 1.5 CAL/FIBER)  750 mL Per Tube Q24H   feeding supplement (PROSource TF)  45 mL Per Tube Daily   free water  200 mL Per Tube Q4H   hydrALAZINE  37.5 mg Per Tube Q8H   mouth rinse  15 mL Mouth Rinse 5 X Daily   modafinil  200 mg Per Tube Daily   multivitamin with minerals  1 tablet Per Tube Daily   pantoprazole sodium  40 mg Per Tube Daily   Continuous Infusions:  sodium chloride Stopped (10/26/20 1445)     LOS: 192 days   Marzetta Board, MD [facility] Pager On Amion  If 7PM-7AM, please contact night-coverage 03/07/2021, 10:22 AM

## 2021-03-07 NOTE — Plan of Care (Signed)
  Problem: Coping: Goal: Will identify appropriate support needs Outcome: Progressing   Problem: Health Behavior/Discharge Planning: Goal: Ability to manage health-related needs will improve Outcome: Progressing   Problem: Self-Care: Goal: Verbalization of feelings and concerns over difficulty with self-care will improve Outcome: Progressing Goal: Ability to communicate needs accurately will improve Outcome: Progressing   Problem: Nutrition: Goal: Risk of aspiration will decrease Outcome: Progressing   Problem: Intracerebral Hemorrhage Tissue Perfusion: Goal: Complications of Intracerebral Hemorrhage will be minimized Outcome: Progressing   Problem: Education: Goal: Knowledge of General Education information will improve Description: Including pain rating scale, medication(s)/side effects and non-pharmacologic comfort measures Outcome: Progressing   Problem: Health Behavior/Discharge Planning: Goal: Ability to manage health-related needs will improve Outcome: Progressing   Problem: Clinical Measurements: Goal: Ability to maintain clinical measurements within normal limits will improve Outcome: Progressing Goal: Will remain free from infection Outcome: Progressing Goal: Diagnostic test results will improve Outcome: Progressing Goal: Respiratory complications will improve Outcome: Progressing Goal: Cardiovascular complication will be avoided Outcome: Progressing   Problem: Coping: Goal: Level of anxiety will decrease Outcome: Progressing   Problem: Elimination: Goal: Will not experience complications related to bowel motility Outcome: Progressing Goal: Will not experience complications related to urinary retention Outcome: Progressing   Problem: Pain Managment: Goal: General experience of comfort will improve Outcome: Progressing   Problem: Skin Integrity: Goal: Risk for impaired skin integrity will decrease Outcome: Progressing   Problem: Intracerebral  Hemorrhage Tissue Perfusion: Goal: Complications of Intracerebral Hemorrhage will be minimized Outcome: Progressing   Problem: Ischemic Stroke/TIA Tissue Perfusion: Goal: Complications of ischemic stroke/TIA will be minimized Outcome: Progressing   

## 2021-03-08 LAB — GLUCOSE, CAPILLARY
Glucose-Capillary: 144 mg/dL — ABNORMAL HIGH (ref 70–99)
Glucose-Capillary: 120 mg/dL — ABNORMAL HIGH (ref 70–99)
Glucose-Capillary: 142 mg/dL — ABNORMAL HIGH (ref 70–99)

## 2021-03-08 NOTE — Progress Notes (Signed)
NAME:  Charlene [name], MRN:  [id number], DOB:  05-24-1966, LOS: 193 ADMISSION DATE:  08/26/2020, CONSULTATION DATE:  2/10 REFERRING MD:  Roda Shutters, CHIEF COMPLAINT:  headache   History of Present Illness:  55 year old female admitted with hypertensive R thalamic/basal ganglia ICH. Resultant obstructive hydrocephalus prompting EVD placement 2/10. Subsequently, 2/10 she had acute mental status change to obtundation and respiratory arrest resulting in intubation. Trach and Peg 2/24. Trach change to #6 cuffless on 3/11.   Hospital course complicated by Serratia pneumonia, ESBL UTI, preorbital cellulitis  Pertinent  Medical History  Hypertension, Medication non-compliance  Significant Hospital Events: Including procedures, antibiotic start and stop dates in addition to other pertinent events   2/09 Admit for ICH 2/10 EVD placed, later intubated due to obtundation and respiratory arrest 2/11 MRI Brain, hypertonic saline d/c'd, EVD not working.  Husband did not consent to PICC  2/13 EVD removed 2/17 GOC discussion with family, PMT 2/24 tracheostomy placed by Dr. Denese Killings, PEG placed by Dr. Sheliah Hatch 3/11 trach changed to [place] 6 uncuffed 3/21 sputum culture with Serratia resistant to Ancef 4/18 28% ATC / 5L  5/08 downsized trach to 4 7/11 No significant secretions / no issues with trach 7/21 Self de-cannulated, trach replaced 7/25 ATC 21%/5L. Using PSV, some speech per SLP, eating 8/1 tolerating PMV, minimal secretions  Interim History / Subjective:  Per SLP-- delayed cough, leaking fluids from her mouth, still needs pureed diet. Speech unintelligible, but able to attempt to say names with SLP. Has still had episodes where she is too sleepy to participate with therapies. Family at bedside today-- she thinks Glenora is pretty awake today.  Objective   Blood pressure 132/84, pulse 86, temperature 98.1 F (36.7 C), temperature source Oral, resp. rate 17, height 5\' 5"  (1.651 m), weight 64 kg, last  menstrual period 03/03/2014, SpO2 100 %, unknown if currently breastfeeding.    FiO2 (%):  [21 %] 21 %  No intake or output data in the 24 hours ending 03/08/21 1112  Filed Weights   03/06/21 0455 03/07/21 0500 03/08/21 0428  Weight: 68.3 kg 65.3 kg 64 kg    Examination: General: chronically ill appearing woman lying in bed in NAD HEENT: [place]/AT,eyes anicteric Neck: trach in place, no bleeding or skin breakdown. PMV on Neuro: awake, neglecting left side, occasionally will look left with significant stimulation, but mostly tracking family to the R. Not coughing on command. Weak induced cough with suctioning down trach, but not as delayed as previous exams. Weaker cough than some previous exams. Moving minimally spontaneously. CV: S1S2, RRR PULM: CTAB, breathing comfortably on TC. No back pressure when PMV removed. GI: soft, NT  Extremities: left elbow in contracture brace, no peripheral edema Skin: warm, dry, no rashes  8/17 CXR personally reviewed> trach in appropriate position, no infiltrates.  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 Dysphagia Ongoing NM weakness and cognitive deficits that limit trach decannulation safely. -con't routine trach care per protocol -Doing well with PMV-- no evidence of airway obstruction limiting it's safe use. Per SLP still needs dysphagia 1 diet, still has shown signs of aspiration with thin liquids in large boluses. -ongoing efforts at rehabilitation -Given lack of significant improvement thus far in terms of consistent cough strength and ability for her to cognitively understand and act on instructions to cough plus signs of impulsiveness and ongoing aspiration, she is not a good candidate for tracheostomy decannulation at  this point in her recovery. PCCM will continue to evaluate her weekly to determine if she has progressed to the point where she can  more safely be decannulated. Per my discussion with her family member at bedside and previous discussions with her husband, her family would prefer to be cautious about the decision to decannulate her trach despite difficulty in placing her.   PCCM will follow up weekly. Please call in the interim with questions.  Steffanie Dunn, DO 03/08/21 12:45 PM [facility]  For contact information, see Amion. If no response to pager, please call PCCM consult pager. After hours, 7PM- 7AM, please call Elink.

## 2021-03-08 NOTE — Progress Notes (Addendum)
[facility] PROGRESS NOTE  Charlene [name] PTW:[id number] DOB: [birth year] DOA: 08/26/2020 PCP: Charlene [name] (Inactive)    October 08, 2020     10/16/2020                        10/16/2020     10/18/2020                        10/20/2020      12/30/2020                   12/30/2020     01/04/2021                   02/24/2021 Charlene [name] really needs a teddy bear!   Status: Remains inpatient appropriate because:Altered mental status and Unsafe d/c plan: Requirement to discharge to trach capable SNF   Dispo: The patient is from: Home              Anticipated d/c is to: SNF trach capable-[place]-we have recently discovered that the [facility] may have a trach bed available in clinicals have been sent to the facility for review -trach team re eval 7/25 and rec NOT to decannulate 2/2 and lack of appropriate voluntary and involuntary cough response              Patient currently is medically stable to d/c.   Difficult to place patient Yes   Level of care: Telemetry Medical  Code Status: Full Family Communication: husband Charlene [name] [phone]) is her POA and legal contact.  Charlene [name] by phone 8/19.  Mother at bedside 8/15-Dtr Charlene [name] 8/19 DVT prophylaxis: Chronically bedbound.  5/6: We will discontinue prophylactic Lovenox   Vaccination status: Unvaccinated-husband still undecided.  Micro Data:  2/9 COVID + Influenza negative 2/10 MRSA PCR negative 2/12 BCx 2 > no growth  2/19 BC 1/2 +Stap EPI, 1/2 no growth  10/07/2020 sputum positive for Serratia marcescens 4/8 Enterobacter cloaca + urine culture MDR sensitive only to imipenem and gentamicin   Antimicrobials:  Cefepime 2/12 > 2/15 Vanc 2/12 > 2/15 10/08/2020 ciprofloxacin>>3/30 Unasyn 3/30 >> 4/02 Rocephin 4/11 > 4/12 Meropenem 4/12>4/17 Meropenem 5/20 through 5/22 Ceftriaxone 5/22 through 5/26 Meropenem 6/3 Ceftriaxone 6/4 through 6/10 Septra 7/4 through 7/7 Ceftriaxone 7/29 was  1 dose followed by 1 dose of fosfomycin   HPI: 55 yo F with 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  Subsequently on 2/10, she developed obtundation and respiratory arrest leading to intubation.  She has remained persistently obtunded, now has a trach and a PEG.  Hospital course complicated by persistent fever, Serratia pneumonia and now pre-orbital cellulitis  As of 3/25 with adjustments in hypertonicity medications patient began to improve regarding hypertonicity.  Eventually started on provigil with improvements in alertness.  Evaluated by rehab physician who recommended increasing provigil dose further.  Subsequently patient has become more alert.  Most days she is able to track and with encouragement able to follow simple commands especially since partial obstruction glasses placed.  See progress note dictated 4/27 for expanded details.   Significant events: 2/09 Admit for ICH 2/10 EVD placed, later intubated due to obtundation and respiratory arrest 2/11 MRI Brain, hypertonic saline d/c'd, EVD not working.  Husband did not consent to PICC  2/13 EVD removed 2/17 GOC discussion with family, PMT 2/24 Bedside  trach and PEG 3/1 [facility]  Subjective: Awake and alert and watching TV.  Objective: Vitals:   03/08/21 0318 03/08/21 0400  BP: (!) 146/89 127/82  Pulse: 96 90  Resp: 16 16  Temp:  98.1 F (36.7 C)  SpO2: 100%    Filed Weights   03/06/21 0455 03/07/21 0500 03/08/21 0428  Weight: 68.3 kg 65.3 kg 64 kg    Exam:       Gen: Awake and alert, NAD Respiratory: #4.0 cuffless trach, trach collar with FiO2 21% at 5L; lungs being clear to auscultation anteriorly, no increased work of breathing, no tracheal secretions Cardiovascular: S1-S2, normotensive, no peripheral edema. Abdomen:  PEG /nocturnal tube feedings. LBM 8/21, often nondistended, normoactive bowel sounds. Neurological: CN  2 -12 intact except for suspected hearing loss post stroke.  Subtle left side mouth droop when smiles.  Spontaneous purposeful movement of right upper extremity/strength 4/5. RLE strength 2/5 with purposeful movement.  Has left-side neglect.   Psychiatric: Awake and able to convey desires by mouthing words or nodding yes or no when asked direct questions  Assessment/Plan: Acute problems: Multiple acute strokes  Right Thalamus ICH with IVH  Obstructive Hydrocephalus/left-sided neglect /Non TBI related hypertonicity -8/19 recently decreased baclofen to twice daily.  Subsequently decreased to HS given daytime sleepiness.  Possibly can discontinue this medication in the middle of this week if does not develop any hypertonicity symptoms. 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  Documented she is at increased risk for aspiration even once decannulated (especially if has concomitant illness such as influenza).  On 8/16 Dr. Dewald/PCCM discussed with patient's husband Charlene [name] regarding risks and benefits for decannulation.  At this time Charlene [name] thinks the risks outweigh the benefits and would like for Charlene [name] to have more neurological recovery before we consider decannulation. Not a candidate for decannulation given lack of significant improvement thus far in terms of consistent cough strength and ability for her to cognitively understand and act on instructions to cough plus signs of impulsiveness and ongoing aspiration  Constipation -Currently on MiraLAX prn noting if given regularly patient has diarrhea -Last bowel movement documented as  8/1 so we will give one-time dose of Chronulac [name] tube  Hypertensive emergency/Malignant HTN -Continue Norvasc, beta-blocker and hydralazine.  Dysphagia -Continue dysphagia 1 with thin liquids -Inconsistent oral intake therefore has been resumed on nocturnal tube feedings  History of urinary retention -Continue Cardura    Other problems: Enterobacter cystitis -Urine culture with Enterobacter resistant to cefazolin, cefepime and Septra. -Previously given 2 days of IV Rocephin and since no signs of sepsis was given a one-time dose of fosfomycin over the weekend -Purewick dc'd due to contributing to recurrent UTIs  Right upper extremity thrombophlebitis - [name] -> age indeterminate SVT of right cephalic vein  Neck Contracture/torticollis -- Resolved  Recurrent right knee pain/known ligamentous injury and chronic osteoarthritis -s/p injection of Marcaine and Depomedrol 6/30 -Continue scheduled Celebrex, Tylenol and Ultram  ESBL positive Enterobacter UTI -Has completed 5 days of meropenem -Contact isolation during hospitalization -continue Oxybutynin suspected bladder spasms  Klebsiella UTI -Resistant to nitrofurantoin and ampicillin -To minimize risk of recurrent UTI have asked that pure wick only be used at bedtime  on a 10 PM to 8 AM schedule  Recurrent diarrhea -Resolved  Bacterial Conjunctivitis /periorbital cellulitis -Resolved  Chronic pansinusitis/recurrent fevers 2/2 aspiration PNA;History of Serratia Pneumonia  Hospital Acquired Pneumonia:  -Completed several courses of antibiotics -Follow-up imaging including sinus CT consistent with chronic but stable and resolving sinusitis   Anemia -Stable  HLD -Continue Lipitor      Data Reviewed: Basic Metabolic Panel: No results for input(s): NA, K, CL, CO2, GLUCOSE, BUN, CREATININE, CALCIUM, MG, PHOS in the last 168 hours.    Liver Function Tests: No results for input(s): AST, ALT, ALKPHOS, BILITOT, PROT,  ALBUMIN in the last 168 hours.     CBC: No results for input(s): WBC, NEUTROABS, HGB, HCT, MCV, PLT in the last 168 hours.     CBG: Recent Labs  Lab 03/04/21 0620 03/04/21 1701 03/05/21 0028 03/05/21 1215 03/06/21 0013  GLUCAP 135* 105* 126* 106* 98     Studies: No results found.   Scheduled Meds:  acetaminophen  650 mg [name] Tube Q6H   amLODipine  10 mg [name] Tube Daily   atorvastatin  40 mg [name] Tube Daily   baclofen  5 mg [name] Tube QHS   carvedilol  25 mg [name] Tube BID WC   celecoxib  200 mg [name] Tube BID   chlorhexidine gluconate (MEDLINE KIT)  15 mL Mouth Rinse BID   Chlorhexidine Gluconate Cloth  6 each Topical Daily   diclofenac Sodium  4 g Topical QID   doxazosin  2 mg [name] Tube Daily   feeding supplement  237 mL Oral TID BM   feeding supplement (JEVITY 1.5 CAL/FIBER)  750 mL [name] Tube Q24H   feeding supplement (PROSource TF)  45 mL [name] Tube Daily   free water  200 mL [name] Tube Q4H   hydrALAZINE  37.5 mg [name] Tube Q8H   mouth rinse  15 mL Mouth Rinse 5 X Daily   modafinil  200 mg [name] Tube Daily   multivitamin with minerals  1 tablet [name] Tube Daily   pantoprazole sodium  40 mg [name] Tube Daily   Continuous Infusions:  sodium chloride Stopped (10/26/20 1445)    Principal Problem:   ICH (intracerebral hemorrhage) ([place]) Active Problems:   Acute hypoxemic respiratory failure (HCC)   Tracheostomy dependent ([place])   Palliative care by specialist   Muscle hypertonicity   Oropharyngeal dysphagia   Poor prognosis   Aspiration pneumonia of right lower lobe (HCC)   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  Palliative care.   PCCM continues to follow.  No decannulation.   Procedures: Tracheostomy PEG  tube EEG Echocardiogram  Antibiotics: Vancomycin 2/12-2/15 Maxipime 2/12-2/15 Ancef 2/19-2/27 Vancomycin 2/20 x 1 dose Maxipime 2/27- 3/5 Vancomycin 3/1-3/3 Ancef 3/6-3/14 Cipro 3/20 4-3/30 Unasyn 3/30-4/4 Rocephin 4/11-4/12 Meropenem 4/12-4/17   Time spent: 15 minutes    Erin Hearing ANP  [facility] 7 am - 330 pm/M-F for direct patient care and secure chat Please refer to [place] for contact info 193  Days

## 2021-03-08 NOTE — Progress Notes (Addendum)
5pm: CSW participated in court hearing regarding patient's disposition planning and lack of capacity. Court hearing was continued until tomorrow at 1pm.  11:20am: CSW met with patient, her mother Charlene [name], and Therapist, sports at bedside. CSW spoke with patient's mother regarding today's court hearing - Charlene [name] states she and her family will be present on the hearing. Patient had a birthday two days ago but was unable to go outside due to limited staffing. There were no questions or concerns to address.  CSW spoke with Dr. Lake Bells to confirm he will be present for today's hearing.  [name], MSW, LCSW Transitions of Care  Clinical Social Worker II [phone]

## 2021-03-08 NOTE — Plan of Care (Signed)
  Problem: Coping: Goal: Will identify appropriate support needs Outcome: Progressing   Problem: Health Behavior/Discharge Planning: Goal: Ability to manage health-related needs will improve Outcome: Progressing   Problem: Self-Care: Goal: Verbalization of feelings and concerns over difficulty with self-care will improve Outcome: Progressing Goal: Ability to communicate needs accurately will improve Outcome: Progressing   Problem: Nutrition: Goal: Risk of aspiration will decrease Outcome: Progressing   Problem: Intracerebral Hemorrhage Tissue Perfusion: Goal: Complications of Intracerebral Hemorrhage will be minimized Outcome: Progressing   Problem: Education: Goal: Knowledge of General Education information will improve Description: Including pain rating scale, medication(s)/side effects and non-pharmacologic comfort measures Outcome: Progressing   Problem: Health Behavior/Discharge Planning: Goal: Ability to manage health-related needs will improve Outcome: Progressing   Problem: Clinical Measurements: Goal: Ability to maintain clinical measurements within normal limits will improve Outcome: Progressing Goal: Will remain free from infection Outcome: Progressing Goal: Diagnostic test results will improve Outcome: Progressing Goal: Respiratory complications will improve Outcome: Progressing Goal: Cardiovascular complication will be avoided Outcome: Progressing   Problem: Coping: Goal: Level of anxiety will decrease Outcome: Progressing   Problem: Elimination: Goal: Will not experience complications related to bowel motility Outcome: Progressing Goal: Will not experience complications related to urinary retention Outcome: Progressing   Problem: Pain Managment: Goal: General experience of comfort will improve Outcome: Progressing   Problem: Skin Integrity: Goal: Risk for impaired skin integrity will decrease Outcome: Progressing   Problem: Intracerebral  Hemorrhage Tissue Perfusion: Goal: Complications of Intracerebral Hemorrhage will be minimized Outcome: Progressing   Problem: Ischemic Stroke/TIA Tissue Perfusion: Goal: Complications of ischemic stroke/TIA will be minimized Outcome: Progressing   

## 2021-03-09 LAB — GLUCOSE, CAPILLARY
Glucose-Capillary: 130 mg/dL — ABNORMAL HIGH (ref 70–99)
Glucose-Capillary: 171 mg/dL — ABNORMAL HIGH (ref 70–99)

## 2021-03-09 MED ORDER — WHITE PETROLATUM EX OINT
TOPICAL_OINTMENT | CUTANEOUS | Status: AC
  Filled 2021-03-09: qty 28.35

## 2021-03-09 NOTE — Progress Notes (Signed)
  Speech Language Pathology Treatment: Dysphagia  Patient Details Name: Charlene [name] MRN: [id number] DOB: [birth year] Today's Date: 03/09/2021 Time: 6962-9528 SLP Time Calculation (min) (ACUTE ONLY): 21 min  Assessment / Plan / Recommendation Clinical Impression  Pt seen for dysphagia tx.  She pulled trach tube this am and PCCM was called to replace.  Pt producing a lot of oral secretions upon arrival. Suctioned  oral cavity and cleaned face.  She accepted sips of apple juice with intermittent oral holding and anterior spillage, consistent with prior sessions.  She swallowed with no s/s of aspiration.  Vocalized intermittently and spoke a few words with very low volume, poor intelligibility with max cues offered.   Pt left with right soft mitt in place.  Husband arrived upon departure. SLP will continue to follow for swallowing and communication.   HPI HPI: Pt is 55 y/o female [facility] on 2[facility]9 with L sided weakness and headaches. PMH includes anxiety, HTN. 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 S/p Right frontal ventricular catheter placement on 2/10, stopped functioning on 2/11.  MRI 2/11 shows right greater than left mesial temporal lobes / parahippocampal cortex, bilateral basal ganglia, and possibly the upper pons that are concerning for acute infarcts, most likely secondary to mass effect from the hemorrhage. ETT 2/10; trach/PEG 09/10/20.  Trach changed to #6 cuffless 3/11; #4 cuffless 5/4. Has had two MBS studies, the last on 7/8 - pt started on dysphagia 1, thin liquids. Trach replaced following self-decannulation 7/21, again on 8/23.      SLP Plan  Continue with current plan of care       Recommendations  Diet recommendations: Dysphagia 1 (puree);Thin liquid Liquids provided via: Straw Medication Administration: Via alternative  means Supervision: Staff to assist with self feeding;Trained caregiver to feed patient;Full supervision/cueing for compensatory strategies Compensations: Minimize environmental distractions;Small sips/bites Postural Changes and/or Swallow Maneuvers: Seated upright 90 degrees                Oral Care Recommendations: Oral care BID Follow up Recommendations: Skilled Nursing facility SLP Visit Diagnosis: Dysphagia, oropharyngeal phase (R13.12) Plan: Continue with current plan of care       GO             Amoni Morales L. Samson [name], MA CCC/SLP Acute Rehabilitation Services Office number [phone] Pager [phone]    [name] 03/09/2021, 2:44 PM

## 2021-03-09 NOTE — Procedures (Signed)
03/09/2021 11:26 AM  Procedure: Reinserted #4 trach without difficulty Called to bedside patient had removed her trach #4. Placed new #4 trach without difficulty end-tidal CO2 was positive sats remained to 100% during the procedure she tolerated well She will most likely remove the trach again if she gets her hand out of her strength. Dr. Chestine Spore aware of procedure Brett Canales Kriste Broman ACNP Acute Care Nurse Practitioner Adolph Pollack Pulmonary/Critical Care Please consult Amion 03/09/2021, 11:27 AM

## 2021-03-09 NOTE — Progress Notes (Signed)
Patient continued to pull at trach after reinsertion and resembled discomfort. Upon arrival of patients mother, mother felt uncomfortable with the level of discomfort the patient presented with. This RN contacted the floor RT and requested presence and the request of the patients family. RT stated they were operating in an active code and would report after.

## 2021-03-09 NOTE — Progress Notes (Signed)
RN called RT to bedside, RN stated that pt had pulled out her trach but saturation is at 628% with no complications. RT x2 at bedside to find pt has pulled trach completely out with SVS and no complications. RT attempted to place trach back in and met resistances. RT did not try to advance in once resistances was met. RN called CCM to bedside. CCM at bedside to place trach back in place. CCM was successful in placing a #4 shiley flex cuffless trach. Good color change was obtained by CO2 detector. Pt tolerated well with SVS. RT will continue to monitor pt.

## 2021-03-09 NOTE — Progress Notes (Addendum)
2pm: CSW participated in court hearing regarding patient's disposition planning and lack of capacity. Court hearing was continued until 03/16/2021 at 10am.  CSW sent patient's clinical information to [place] at [facility] in [place], [place] for review.  9am: CSW spoke with [place] at [facility] in [place] to provide an update on the patient. CSW sent updated clinicals for review.  Edwin Dada, MSW, LCSW Transitions of Care  Clinical Social Worker II [phone]

## 2021-03-09 NOTE — NC FL2 (Signed)
[place] LEVEL OF CARE SCREENING TOOL     IDENTIFICATION  Patient Name: Charlene [name] Birthdate: 1966/06/04 Sex: female Admission Date (Current Location): 08/26/2020  [place] and [place] Number:  (P) [place] (P) Medicaid potential - pending #[id number] [place] and Address:  (P) [facility] [place], [place] [address] [place], [place] [address]      Provider Number: [id number]  Attending Physician Name and Address:  Caren Griffins, MD  Relative Name and Phone Number:  Mamie Nick) [name], husband - [phone]    Current Level of Care: (P) Hospital Recommended Level of Care: (P) [place] Prior Approval Number:    Date Approved/Denied:   PASRR Number: Mamie Nick) [id number]  Discharge Plan: (P) SNF    Current Diagnoses: Patient Active Problem List   Diagnosis Date Noted   Nonspecific abnormal electrocardiogram (ECG) (EKG)    Knee pain, right    Constipation    Ileus, unspecified ([place])    Acute cystitis without hematuria    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    Aspiration pneumonia of right lower lobe (HCC)    Poor prognosis 10/08/2020   Muscle hypertonicity    Oropharyngeal dysphagia    Palliative care by specialist    Tracheostomy dependent ([place])    [place] (intracerebral hemorrhage) ([place]) 08/27/2020   Acute hypoxemic respiratory failure ([place])    Anxiety state, unspecified 03/11/2014   Essential hypertension 03/11/2014   Essential hypertension, benign 02/28/2014   Accelerated hypertension 02/21/2014   Nausea & vomiting 02/21/2014   Headache 02/21/2014   Chest pain 02/21/2014    Orientation RESPIRATION BLADDER Height & Weight     (P)  (unable to assess due to tracheostomy)  (P) Tracheostomy (P) External catheter Weight: 148 lb 5.9 oz (67.3 kg) Height:  '5\' 5"'  (165.1 cm)  BEHAVIORAL SYMPTOMS/MOOD NEUROLOGICAL BOWEL NUTRITION STATUS      (P) Incontinent  Feeding tube, Diet  AMBULATORY STATUS COMMUNICATION OF NEEDS Skin   (P) Total Care (P) Does not communicate (#4 tracheostomy - humidified tracheostomy collar) (P) Other (Comment) (tracheostomy and PEG present)                       Personal Care Assistance Level of Assistance  (P) Bathing, Feeding, Dressing, Total care Bathing Assistance: (P) Maximum assistance Feeding assistance: (P) Maximum assistance Dressing Assistance: (P) Maximum assistance Total Care Assistance: (P) Maximum assistance   Functional Limitations Info  (P) Sight, Hearing, Speech Sight Info: (P) Impaired Hearing Info: (P) Impaired Speech Info: (P) Impaired    SPECIAL CARE FACTORS FREQUENCY  PT (By licensed PT), OT (By licensed OT), Speech therapy     PT Frequency: 2-3 times per week OT Frequency: 2-3 times per week     Speech Therapy Frequency: 1-2 times per week      Contractures Contractures Info: Present    Additional Factors Info  Code Status, Allergies, Isolation Precautions Code Status Info: Full code Allergies Info: NKDA     Isolation Precautions Info: ESBL     Current Medications (03/09/2021):  This is the current hospital active medication list Current Facility-Administered Medications  Medication Dose Route Frequency Provider Last Rate Last Admin   0.9 %  sodium chloride infusion   Intravenous PRN Sherrilyn Rist A, MD   Stopped at 10/26/20 1445   acetaminophen (TYLENOL) tablet 650 mg  650 mg Per Tube Q6H Samella Parr, NP   650 mg at 03/09/21  1142   amLODipine (NORVASC) tablet 10 mg  10 mg Per Tube Daily Danford, Suann Larry, MD   10 mg at 03/09/21 1128   atorvastatin (LIPITOR) tablet 40 mg  40 mg Per Tube Daily Elodia Florence., MD   40 mg at 03/09/21 1128   baclofen (OZOBAX) 1 mg/mL oral solution 5 mg  5 mg Per Tube QHS Samella Parr, NP   5 mg at 03/08/21 2211   carvedilol (COREG) tablet 25 mg  25 mg Per Tube BID WC Wendee Beavers T, MD   25 mg at 03/09/21 1128    celecoxib (CELEBREX) capsule 200 mg  200 mg Per Tube BID Samella Parr, NP   200 mg at 03/09/21 1128   chlorhexidine gluconate (MEDLINE KIT) (PERIDEX) 0.12 % solution 15 mL  15 mL Mouth Rinse BID Rosalin Hawking, MD   15 mL at 03/09/21 1130   Chlorhexidine Gluconate Cloth 2 % PADS 6 each  6 each Topical Daily Amie Portland, MD   6 each at 03/09/21 1130   diclofenac Sodium (VOLTAREN) 1 % topical gel 4 g  4 g Topical QID Samella Parr, NP   4 g at 03/09/21 1128   doxazosin (CARDURA) tablet 2 mg  2 mg Per Tube Daily Pokhrel, Laxman, MD   2 mg at 03/09/21 1128   feeding supplement (ENSURE ENLIVE / ENSURE PLUS) liquid 237 mL  237 mL Oral TID BM Patrecia Pour, MD   237 mL at 03/09/21 1130   feeding supplement (JEVITY 1.5 CAL/FIBER) liquid 750 mL  750 mL Per Tube Q24H Little Ishikawa, MD   750 mL at 03/08/21 2047   feeding supplement (PROSource TF) liquid 45 mL  45 mL Per Tube Daily Little Ishikawa, MD   45 mL at 03/08/21 2045   free water 200 mL  200 mL Per Tube Q4H Samella Parr, NP   200 mL at 03/09/21 1131   hydrALAZINE (APRESOLINE) tablet 37.5 mg  37.5 mg Per Tube Q8H Samella Parr, NP   37.5 mg at 03/09/21 0522   labetalol (NORMODYNE) injection 10 mg  10 mg Intravenous Q2H PRN Pokhrel, Laxman, MD   10 mg at 03/02/21 8469   magic mouthwash w/lidocaine  10 mL Oral QID PRN Samella Parr, NP   10 mL at 02/04/21 2235   MEDLINE mouth rinse  15 mL Mouth Rinse 5 X Daily Nita Sells, MD   15 mL at 03/09/21 1131   modafinil (PROVIGIL) tablet 200 mg  200 mg Per Tube Daily Alger Simons T, MD   200 mg at 03/09/21 6295   multivitamin with minerals tablet 1 tablet  1 tablet Per Tube Daily Joselyn Glassman A, RPH   1 tablet at 03/09/21 1128   pantoprazole sodium (PROTONIX) 40 mg/20 mL oral suspension 40 mg  40 mg Per Tube Daily Samella Parr, NP   40 mg at 03/09/21 1128   polyethylene glycol (MIRALAX / GLYCOLAX) packet 17 g  17 g Per Tube Daily PRN Pokhrel, Laxman, MD   17 g at  02/12/21 0817   traMADol (ULTRAM) tablet 25 mg  25 mg Per Tube Q12H PRN Joselyn Glassman A, RPH   25 mg at 03/02/21 0518     Discharge Medications: Please see discharge summary for a list of discharge medications.  Relevant Imaging Results:  Relevant Lab Results:   Additional Information SSN: [id number]  Archie Endo, LCSW

## 2021-03-09 NOTE — Progress Notes (Signed)
Nutrition Follow-up  DOCUMENTATION CODES:  Not applicable  INTERVENTION:  -Continue to encourage PO intake & assist as needed -Continue Ensure Enlive po TID -Continue MVI with minerals daily  Continue nocturnal TF via PEG: -Jevity 1.5 @ 82ml/hr for 10 hours from 2000-0600 ( total) -64ml PROSource TF daily Provides 1165 kcals, 58 grams protein, free water -- meets ~73% minimum estimated calorie and protein needs -Free water flushes per MD, currently Q4H (provides total free water w/ TF and flushes)  NUTRITION DIAGNOSIS:  Inadequate oral intake related to inability to eat as evidenced by NPO status. 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  Subsequently on 2/10, pt developed obtundation and respiratory arrest leading to intubation. Pt is now s/p trach/PEG placement. Hospital course complicated by Serratia pneumonia, ESBL Enterobacter UTI and pre-orbital cellulitis.  2/10 s/p EVD placement and intubation for respiratory arrest 2/13 EVD removed 2/17 palliative care consult; family requesting tx to [facility] 2/24 s/p trach and PEG 3/1 tx to [facility] 3/11 trach changed to shiley #6 cuffless 4/08 pt w/ sepsis 2/2 UTI 4/18 28% ATC/5L 5/2 CIR reevaluated pt and determined she has not made enough improvement for admit; recommended SNF 5/8 trach changed to shiley #4 cuffless 5/23 CCM evaluated and determined pt stilll unable to decannulate due to variable mental status 6/3 trach exchanged, still shiley #4  cuffless 6/30 s/p R knee aspiration and injection 7/3 trach exchanged, still shiley #4 cuffless 7/8 diet advanced to dysphagia 1 with thin liquids 7/17 trach exchanged, still shiley #4 cuffless 7/21 trach replaced after pt self-decannulated  8/01 tolerating PMV, minimal secretions 8/02 PEG replaced after pt removed 8/14 trach exchanged, still shiley #4 cuffless  CCM had to reinsert #4 trach today as pt had decannulated herself. Pt tolerated procedure well per MD.   PO Intake: 0-100% completion (27% average meal intake). Per RN, pt doing well with oral nutrition supplements. Recommend continue current nutrition plan of care.    Admission weight: 61.2 kg Current weight: 67.3 kg  Generalized mild pitting edema noted per RN edema assessment  UOP: documented x24 hours  Medications: Ensure Enlive/Plus TID, mvi with minerals, protonix Labs reviewed. CBGs 142-130-171  Current TF: Jevity 1.5 @ 28ml/hr administered over 10 hours (2000-0600) to provide TF per night, 39ml Prosource TF daily, free water Q4H  Diet Order:   Diet Order             DIET - DYS 1 Room service appropriate? No; Fluid consistency: Thin  Diet effective now                  EDUCATION NEEDS:  No education needs have been identified at this time  Skin:  Skin Assessment: Reviewed RN Assessment  Last BM:  8/21 type 4  Height:  Ht Readings from Last 1 Encounters:  10/18/20 5\' 5"  (1.651 m)   Weight:  Wt Readings from Last 1 Encounters:  03/09/21 67.3 kg   Ideal Body Weight:  56.8 kg  BMI:  Body mass index is 24.69 kg/m.  Estimated  Nutritional Needs:  Kcal:  1600-1800 Protein:  80-90 grams Fluid:  >1.6L/d   Eugene Gavia, MS, RD, LDN (she/her/hers) RD pager number and weekend/on-call pager number located in Amion.

## 2021-03-09 NOTE — Progress Notes (Addendum)
[facility] PROGRESS NOTE  Charlene [name]:[id number] DOB: [birth year] DOA: 08/26/2020 PCP: Patient, [name] (Inactive)    October 08, 2020     10/16/2020                        10/16/2020     10/18/2020                        10/20/2020      12/30/2020                   12/30/2020     01/04/2021                   02/24/2021 Viv really needs a teddy bear!   Status: Remains inpatient appropriate because:Altered mental status and Unsafe d/c plan: Requirement to discharge to trach capable SNF   Dispo: The patient is from: Home              Anticipated d/c is to: SNF trach capable-[place]-we have recently discovered that the [facility] may have a trach bed available in clinicals have been sent to the facility for review -trach team re eval 7/25 and rec NOT to decannulate 2/2 and lack of appropriate voluntary and involuntary cough response              Patient currently is medically stable to d/c.   Difficult to place patient Yes   Level of care: Telemetry Medical  Code Status: Full Family Communication: husband Charlene [name] [phone]) is her POA and legal contact.  Charlene [name] by phone 8/19.  Mother at bedside 8/15-Dtr Charlene [name] 8/19 DVT prophylaxis: Chronically bedbound.  5/6: We will discontinue prophylactic Lovenox   Vaccination status: Unvaccinated-husband still undecided.  Micro Data:  2/9 COVID + Influenza negative 2/10 MRSA PCR negative 2/12 BCx 2 > no growth  2/19 BC 1/2 +Stap EPI, 1/2 no growth  10/07/2020 sputum positive for Serratia marcescens 4/8 Enterobacter cloaca + urine culture MDR sensitive only to imipenem and gentamicin   Antimicrobials:  Cefepime 2/12 > 2/15 Vanc 2/12 > 2/15 10/08/2020 ciprofloxacin>>3/30 Unasyn 3/30 >> 4/02 Rocephin 4/11 > 4/12 Meropenem 4/12>4/17 Meropenem 5/20 through 5/22 Ceftriaxone 5/22 through 5/26 Meropenem 6/3 Ceftriaxone 6/4 through 6/10 Septra 7/4 through 7/7 Ceftriaxone 7/29 was  1 dose followed by 1 dose of fosfomycin   HPI: 55 yo F with 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  Subsequently on 2/10, she developed obtundation and respiratory arrest leading to intubation.  She has remained persistently obtunded, now has a trach and a PEG.  Hospital course complicated by persistent fever, Serratia pneumonia and now pre-orbital cellulitis  As of 3/25 with adjustments in hypertonicity medications patient began to improve regarding hypertonicity.  Eventually started on provigil with improvements in alertness.  Evaluated by rehab physician who recommended increasing provigil dose further.  Subsequently patient has become more alert.  Most days she is able to track and with encouragement able to follow simple commands especially since partial obstruction glasses placed.  See progress note dictated 4/27 for expanded details.   Significant events: 2/09 Admit for ICH 2/10 EVD placed, later intubated due to obtundation and respiratory arrest 2/11 MRI Brain, hypertonic saline d/c'd, EVD not working.  Husband did not consent to PICC  2/13 EVD removed 2/17 GOC discussion with family, PMT 2/24 Bedside  trach and PEG 3/1 Transferred to [place]  Subjective: Awake- motioned for me to remove R mitt (double checked trach ties to ensure not too tight).  Asked her if she wanted her glasses on and she said no.  2 hours after I evaluated the patient she decannulated herself.  Her sats remained stable.  PCCM and RT called regarding replacement of trach  Objective: Vitals:   03/09/21 0024 03/09/21 0409  BP: 112/70 121/72  Pulse: 86 95  Resp: 16 16  Temp:  98.4 F (36.9 C)  SpO2: 99% 100%   Filed Weights   03/07/21 0500 03/08/21 0428 03/09/21 0447  Weight: 65.3 kg 64 kg 67.3 kg    Exam:       Gen: Awake and alert, NAD Respiratory: #4.0 cuffless trach, trach collar with FiO2 21% at 5L; lungs remain  clear, no increased work of breathing.  O2 sats stable  Cardiovascular: S1-S2, normotensive, no peripheral edema. Abdomen:  PEG /nocturnal tube feedings. LBM 8/21, often nondistended, normoactive bowel sounds. Neurological: CN 2 -12 intact except for suspected hearing loss post stroke. Spontaneous purposeful movement of right upper extremity/strength 4/5. RLE strength 2/5 with purposeful movement.  Has left-side neglect.   Psychiatric: Awake and able to convey desires by mouthing words or nodding yes or no when asked direct questions  Assessment/Plan: Acute problems: Multiple acute strokes  Right Thalamus ICH with IVH  Obstructive Hydrocephalus/left-sided neglect /Non TBI related hypertonicity -8/19 recently decreased baclofen to twice daily.  Subsequently decreased to HS given daytime sleepiness.  Possibly can discontinue this medication in the middle of this week if does not develop any hypertonicity symptoms. 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 On 8/16 Dr. Dewald/PCCM discussed with patient's husband Charlene [name] regarding risks and benefits for decannulation.  At this time Charlene [name] and patient's mother agree that the risks outweigh the benefits and would like for Charlene [name] to have more neurological recovery before consideration for decannulation. -Not a candidate for decannulation given lack of significant improvement thus far in terms of consistent cough strength and ability for her to cognitively understand and act on instructions to cough plus signs of impulsiveness and ongoing aspiration  Constipation -Currently on MiraLAX prn noting if given regularly patient has diarrhea -Last bowel movement documented as 8/1 so we will give one-time dose of Chronulac per tube  Hypertensive emergency/Malignant  HTN -Continue Norvasc, beta-blocker and hydralazine.  Dysphagia -Continue dysphagia 1 with thin liquids -Inconsistent oral intake therefore has been resumed on nocturnal tube feedings  History of urinary retention -Continue Cardura    Other problems: Enterobacter cystitis -Urine culture with Enterobacter resistant to cefazolin, cefepime and Septra. -Previously given 2 days of IV Rocephin and since no signs of sepsis was given a one-time dose of fosfomycin over the weekend -Purewick dc'd due to contributing to recurrent UTIs  Right upper extremity thrombophlebitis - [name] -> age indeterminate SVT of right cephalic vein  Neck Contracture/torticollis -- Resolved  Recurrent right knee pain/known ligamentous injury and chronic osteoarthritis -s/p injection of Marcaine and Depomedrol 6/30 -Continue scheduled Celebrex, Tylenol and Ultram  ESBL positive Enterobacter UTI -Has completed 5 days of meropenem -Contact isolation during hospitalization -continue Oxybutynin suspected bladder spasms  Klebsiella UTI -Resistant to nitrofurantoin and ampicillin -To minimize risk of recurrent UTI have asked that pure wick only be used at bedtime on a 10 PM to  8 AM schedule  Recurrent diarrhea -Resolved  Bacterial Conjunctivitis /periorbital cellulitis -Resolved  Chronic pansinusitis/recurrent fevers 2/2 aspiration PNA;History of Serratia Pneumonia  Hospital Acquired Pneumonia:  -Completed several courses of antibiotics -Follow-up imaging including sinus CT consistent with chronic but stable and resolving sinusitis   Anemia -Stable  HLD -Continue Lipitor      Data Reviewed: Basic Metabolic Panel: No results for input(s): NA, K, CL, CO2, GLUCOSE, BUN, CREATININE, CALCIUM, MG, PHOS in the last 168 hours.    Liver Function Tests: No results for input(s): AST, ALT, ALKPHOS, BILITOT, PROT, ALBUMIN in the last 168 hours.     CBC: No results for input(s): WBC, NEUTROABS, HGB,  HCT, MCV, PLT in the last 168 hours.     CBG: Recent Labs  Lab 03/05/21 1215 03/06/21 0013 03/07/21 0658 03/07/21 2140 03/08/21 0550  GLUCAP 106* 98 144* 120* 142*     Studies: No results found.   Scheduled Meds:  acetaminophen  650 mg Per Tube Q6H   amLODipine  10 mg Per Tube Daily   atorvastatin  40 mg Per Tube Daily   baclofen  5 mg Per Tube QHS   carvedilol  25 mg Per Tube BID WC   celecoxib  200 mg Per Tube BID   chlorhexidine gluconate (MEDLINE KIT)  15 mL Mouth Rinse BID   Chlorhexidine Gluconate Cloth  6 each Topical Daily   diclofenac Sodium  4 g Topical QID   doxazosin  2 mg Per Tube Daily   feeding supplement  237 mL Oral TID BM   feeding supplement (JEVITY 1.5 CAL/FIBER)  750 mL Per Tube Q24H   feeding supplement (PROSource TF)  45 mL Per Tube Daily   free water  200 mL Per Tube Q4H   hydrALAZINE  37.5 mg Per Tube Q8H   mouth rinse  15 mL Mouth Rinse 5 X Daily   modafinil  200 mg Per Tube Daily   multivitamin with minerals  1 tablet Per Tube Daily   pantoprazole sodium  40 mg Per Tube Daily   Continuous Infusions:  sodium chloride Stopped (10/26/20 1445)    Principal Problem:   ICH (intracerebral hemorrhage) ([place]) Active Problems:   Acute hypoxemic respiratory failure (HCC)   Tracheostomy dependent ([place])   Palliative care by specialist   Muscle hypertonicity   Oropharyngeal dysphagia   Poor prognosis   Aspiration pneumonia of right lower lobe (HCC)   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  Palliative care.   PCCM continues to follow.  No decannulation.   Procedures: Tracheostomy PEG tube EEG Echocardiogram  Antibiotics: Vancomycin 2/12-2/15 Maxipime 2/12-2/15 Ancef  2/19-2/27 Vancomycin 2/20 x 1 dose Maxipime 2/27- 3/5 Vancomycin 3/1-3/3 Ancef 3/6-3/14 Cipro 3/20 4-3/30 Unasyn 3/30-4/4 Rocephin 4/11-4/12 Meropenem 4/12-4/17   Time spent: 15 minutes    Erin Hearing ANP  [facility] 7 am - 330 pm/M-F for direct patient care and secure chat Please refer to [place] for contact info 194  Days

## 2021-03-09 NOTE — Progress Notes (Signed)
Upon entering the room with the technician to provide patient care and pass medication and the patient had removed the safety mitt and encapsulated her trach. RT was paged and is at the bedside. MD was paged and is at the bedside. Attempt to place it back in is in progress and was successful. Patients husband has been notified.

## 2021-03-10 LAB — GLUCOSE, CAPILLARY
Glucose-Capillary: 133 mg/dL — ABNORMAL HIGH (ref 70–99)
Glucose-Capillary: 137 mg/dL — ABNORMAL HIGH (ref 70–99)

## 2021-03-10 NOTE — Progress Notes (Signed)
[facility] PROGRESS NOTE  Charlene [name] PTW:[id number] DOB: [birth year] DOA: 08/26/2020 PCP: Charlene [name], [name] (Inactive)    October 08, 2020     10/16/2020                        10/16/2020     10/18/2020                        10/20/2020      12/30/2020                   12/30/2020     01/04/2021                   02/24/2021 Viv really needs a teddy bear!   Status: Remains inpatient appropriate because:Altered mental status and Unsafe d/c plan: Requirement to discharge to trach capable SNF   Dispo: The Charlene [name] is from: Home              Anticipated d/c is to: SNF trach capable-[place]-we have recently discovered that the [facility] may have a trach bed available in clinicals have been sent to the facility for review -trach team re eval 7/25 and rec NOT to decannulate 2/2 and lack of appropriate voluntary and involuntary cough response              Charlene [name] currently is medically stable to d/c.   Difficult to place Charlene [name] Yes   Level of care: Telemetry Medical  Code Status: Full Family Communication: husband Charlene [name] [phone]) is her POA and legal contact.  Charlene [name] by phone 8/19.  Mother at bedside 8/15-Dtr Charlene [name] 8/19 DVT prophylaxis: Chronically bedbound.  5/6: We will discontinue prophylactic Lovenox   Vaccination status: Unvaccinated-husband still undecided.  Micro Data:  2/9 COVID + Influenza negative 2/10 MRSA PCR negative 2/12 BCx 2 > no growth  2/19 BC 1/2 +Stap EPI, 1/2 no growth  10/07/2020 sputum positive for Serratia marcescens 4/8 Enterobacter cloaca + urine culture MDR sensitive only to imipenem and gentamicin   Antimicrobials:  Cefepime 2/12 > 2/15 Vanc 2/12 > 2/15 10/08/2020 ciprofloxacin>>3/30 Unasyn 3/30 >> 4/02 Rocephin 4/11 > 4/12 Meropenem 4/12>4/17 Meropenem 5/20 through 5/22 Ceftriaxone 5/22 through 5/26 Meropenem 6/3 Ceftriaxone 6/4 through 6/10 Septra 7/4 through 7/7 Ceftriaxone 7/29 was  1 dose followed by 1 dose of fosfomycin   HPI: 55 yo F with 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  Subsequently on 2/10, she developed obtundation and respiratory arrest leading to intubation.  She has remained persistently obtunded, now has a trach and a PEG.  Hospital course complicated by persistent fever, Serratia pneumonia and now pre-orbital cellulitis  As of 3/25 with adjustments in hypertonicity medications Charlene [name] began to improve regarding hypertonicity.  Eventually started on provigil with improvements in alertness.  Evaluated by rehab physician who recommended increasing provigil dose further.  Subsequently Charlene [name] has become more alert.  Most days she is able to track and with encouragement able to follow simple commands especially since partial obstruction glasses placed.  See progress note dictated 4/27 for expanded details.   Significant events: 2/09 Admit for ICH 2/10 EVD placed, later intubated due to obtundation and respiratory arrest 2/11 MRI Brain, hypertonic saline d/c'd, EVD not working.  Husband did not consent to PICC  2/13 EVD removed 2/17 GOC discussion with family, PMT 2/24 Bedside  trach and PEG 3/1 Transferred to [facility]  Subjective: Awaken from sleep.  Able to relay that her tracheostomy insertion site is tender.  Minded her about her pulling out the trach and this is likely why it is tender.  Asked her if she wanted Tylenol and she nodded and said yes.  She also wanted her glasses placed.  Objective: Vitals:   03/10/21 0548 03/10/21 0737  BP: 127/78 (!) 152/96  Pulse: 78 95  Resp:  18  Temp:  98.4 F (36.9 C)  SpO2: 100%    Filed Weights   03/08/21 0428 03/09/21 0447 03/10/21 0500  Weight: 64 kg 67.3 kg 66.4 kg    Exam:       Gen: Awake and alert, NAD Respiratory: #4.0 cuffless trach, trach collar with FiO2 21% at 5L; respiratory status is stable with normal pulse  oximetry, lungs are clear and no increased work of breathing.  Trach tube site unremarkable and no bleeding Cardiovascular: Normotensive, regular pulse, no peripheral edema, normal heart Abdomen:  PEG /nocturnal tube feedings. LBM 8/21, often nondistended, normoactive bowel sounds. Neurological: CN 2 -12 intact except for suspected hearing loss post stroke. Spontaneous purposeful movement of right upper extremity/strength 4/5.  Of note, Charlene [name] has purposely dislodged trach multiple occasions and now is requiring mitten placement unless under direct supervision.  RLE strength 2/5 with purposeful movement.  Has left-side neglect.   Psychiatric: Awake and able to convey desires by mouthing words or nodding yes or no when asked direct questions  Assessment/Plan: Acute problems: Multiple acute strokes  Right Thalamus ICH with IVH  Obstructive Hydrocephalus/left-sided neglect /Non TBI related hypertonicity -8/19 recently decreased baclofen to twice daily.  Subsequently decreased to HS given daytime sleepiness.  Possibly can discontinue this medication in the middle of this week if does not develop any hypertonicity symptoms. 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 On 8/16 Dr. Dewald/PCCM discussed with Charlene [name]'s husband Charlene [name] regarding risks and benefits for decannulation.  At this time Charlene [name] and Charlene [name]'s mother agree that the risks outweigh the benefits and would like for Charlene [name] to have more neurological recovery before consideration for decannulation. -Not a candidate for decannulation given lack of significant improvement thus far in terms of consistent cough strength and ability for her to cognitively understand and act on instructions to cough plus signs of impulsiveness and ongoing aspiration -Charlene [name] self  decannulated once again yesterday afternoon but was successfully recannulated.  She maintained normal saturations.  Now she is having soreness around the trach insertion site.  Has Tylenol available for mild pain and Ultram available for more severe pain  Constipation -Currently on MiraLAX prn noting if given regularly Charlene [name] has diarrhea -Last bowel movement documented as 8/1 so we will give one-time dose of Chronulac per tube  Hypertensive emergency/Malignant HTN -Continue Norvasc, beta-blocker and hydralazine.  Dysphagia -Continue dysphagia 1 with thin liquids -Inconsistent oral intake therefore has been resumed on nocturnal tube feedings  History of urinary retention -Continue Cardura    Other problems: Enterobacter cystitis -Urine culture with Enterobacter resistant to cefazolin, cefepime and Septra. -Previously given 2 days of IV Rocephin and since no signs of sepsis was given a one-time dose of fosfomycin over the weekend -Purewick dc'd due to contributing to recurrent UTIs  Right upper extremity thrombophlebitis - [name] -> age indeterminate SVT of right cephalic vein  Neck Contracture/torticollis --  Resolved  Recurrent right knee pain/known ligamentous injury and chronic osteoarthritis -s/p injection of Marcaine and Depomedrol 6/30 -Continue scheduled Celebrex, Tylenol and Ultram  ESBL positive Enterobacter UTI -Has completed 5 days of meropenem -Contact isolation during hospitalization -continue Oxybutynin suspected bladder spasms  Klebsiella UTI -Resistant to nitrofurantoin and ampicillin -To minimize risk of recurrent UTI have asked that pure wick only be used at bedtime on a 10 PM to 8 AM schedule  Recurrent diarrhea -Resolved  Bacterial Conjunctivitis /periorbital cellulitis -Resolved  Chronic pansinusitis/recurrent fevers 2/2 aspiration PNA;History of Serratia Pneumonia  Hospital Acquired Pneumonia:  -Completed several courses of antibiotics -Follow-up  imaging including sinus CT consistent with chronic but stable and resolving sinusitis   Anemia -Stable  HLD -Continue Lipitor      Data Reviewed: Basic Metabolic Panel: No results for input(s): NA, K, CL, CO2, GLUCOSE, BUN, CREATININE, CALCIUM, MG, PHOS in the last 168 hours.    Liver Function Tests: No results for input(s): AST, ALT, ALKPHOS, BILITOT, PROT, ALBUMIN in the last 168 hours.     CBC: No results for input(s): WBC, NEUTROABS, HGB, HCT, MCV, PLT in the last 168 hours.     CBG: Recent Labs  Lab 03/07/21 0658 03/07/21 2140 03/08/21 0550 03/09/21 0204 03/09/21 0635  GLUCAP 144* 120* 142* 130* 171*     Studies: No results found.   Scheduled Meds:  acetaminophen  650 mg Per Tube Q6H   amLODipine  10 mg Per Tube Daily   atorvastatin  40 mg Per Tube Daily   baclofen  5 mg Per Tube QHS   carvedilol  25 mg Per Tube BID WC   celecoxib  200 mg Per Tube BID   chlorhexidine gluconate (MEDLINE KIT)  15 mL Mouth Rinse BID   Chlorhexidine Gluconate Cloth  6 each Topical Daily   diclofenac Sodium  4 g Topical QID   doxazosin  2 mg Per Tube Daily   feeding supplement  237 mL Oral TID BM   feeding supplement (JEVITY 1.5 CAL/FIBER)  750 mL Per Tube Q24H   feeding supplement (PROSource TF)  45 mL Per Tube Daily   free water  200 mL Per Tube Q4H   hydrALAZINE  37.5 mg Per Tube Q8H   mouth rinse  15 mL Mouth Rinse 5 X Daily   modafinil  200 mg Per Tube Daily   multivitamin with minerals  1 tablet Per Tube Daily   pantoprazole sodium  40 mg Per Tube Daily   Continuous Infusions:  sodium chloride Stopped (10/26/20 1445)    Principal Problem:   ICH (intracerebral hemorrhage) ([place]) Active Problems:   Acute hypoxemic respiratory failure (HCC)   Tracheostomy dependent ([place])   Palliative care by specialist   Muscle hypertonicity   Oropharyngeal dysphagia   Poor prognosis   Aspiration pneumonia of right lower lobe (HCC)   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  Palliative care.   PCCM continues to follow.  No decannulation.   Procedures: Tracheostomy PEG tube EEG Echocardiogram  Antibiotics: Vancomycin 2/12-2/15 Maxipime 2/12-2/15 Ancef 2/19-2/27 Vancomycin 2/20 x 1 dose Maxipime 2/27- 3/5 Vancomycin 3/1-3/3 Ancef 3/6-3/14 Cipro 3/20 4-3/30 Unasyn 3/30-4/4 Rocephin 4/11-4/12 Meropenem 4/12-4/17   Time spent: 15 minutes    Erin Hearing ANP  [facility] 7 am -  330 pm/M-F for direct Charlene [name] care and secure chat Please refer to Amion for contact info 195  Days

## 2021-03-10 NOTE — Progress Notes (Signed)
Physical Therapy Treatment Patient Details Name: Charlene [name] MRN: [id number] DOB: 03-Jan-1966 Today's Date: 03/10/2021    History of Present Illness Pt is 55 y/o female presents to [facility] on 08/26/20 with L sided weakness and headaches. 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 s/p Right frontal ventricular catheter placement on 2/10, stopped functioning on 2/11.  MRI 2/11 shows right greater than left mesial temporal lobes / parahippocampal cortex, bilateral basal ganglia, and possibly the upper pons that are concerning for acute infarcts, most likely secondary to mass effect from the hemorrhage. ETT 2/10-2/24.  Pt with trach and PEG on 09/10/20. Hospital course complicated by persistent fever, Serratia pneumonia and now pre-orbital cellulitis.   Pt self decannulated 02/04/21, trach re-inserted by RT. Pt dislodged PEG, guided replacement 8/2.  PMH includes anxiety, HTN.    PT Comments    Vivan participated in therapy sitting EOB >20 mins today and lifting OOB to tilt in space WC at the end of the session for better positioning for eating.  Father, Bethann Berkshire, in room today observing session.  Goals updated and reviewed.  PT will continue to follow acutely for safe mobility progression.  Follow Up Recommendations  SNF     Equipment Recommendations  Wheelchair (measurements PT);Wheelchair cushion (measurements PT);Hospital bed;Other (comment) (18x18 tilt in space, hosp bed with air mattress overlay)    Recommendations for Other Services       Precautions / Restrictions Precautions Precautions: Fall Precaution Comments: peg and trach, R mitt Required Braces or Orthoses: Other Brace Other Brace: bil. PRAFOs, soft R knee extension splint and soft L elbow extension splint    Mobility  Bed Mobility Overal bed mobility: Needs Assistance Bed Mobility:  Rolling Rolling: Mod assist;Max assist Sidelying to sit: Mod assist;Max assist   Sit to supine: Max assist   General bed mobility comments: Pt rolls right easier than left, comes up from side lying on her right easier than left and is generally mod to max assist depending on which side we are coming up from/ro;;omg towards.    Transfers Overall transfer level: Needs assistance Equipment used: Ambulation equipment used             General transfer comment: Maxi move OOB to the WC.  Ambulation/Gait                 Stairs             Wheelchair Mobility    Modified Rankin (Stroke Patients Only)       Balance   Sitting-balance support: Single extremity supported;Feet supported Sitting balance-Leahy Scale: Poor Sitting balance - Comments: mod assist to as low as min assist EOB. At times pushing with R hand to the left, better when disengaged. Postural control: Posterior lean;Left lateral lean                                  Cognition Arousal/Alertness: Awake/alert Behavior During Therapy: Flat affect Overall Cognitive Status: Impaired/Different from baseline Area of Impairment: Attention;Following commands;Safety/judgement;Awareness;Problem solving                   Current Attention Level: Sustained   Following Commands: Follows one step commands inconsistently;Follows one step commands with increased time Safety/Judgement: Decreased awareness of safety;Decreased awareness of deficits Awareness: Intellectual Problem Solving: Decreased initiation;Difficulty  sequencing;Requires verbal cues;Requires tactile cues;Slow processing General Comments: remains similar to previous sessions.  Mom on the phone reporting she played tic tack toe with her earlier in the week.      Exercises      General Comments General comments (skin integrity, edema, etc.): Dad, Bethann Berkshire, in room during session, mom, [name] on the phone at the end of the  session.      Pertinent Vitals/Pain Pain Assessment: Faces Faces Pain Scale: Hurts little more Pain Location: generalized with mobility Pain Descriptors / Indicators: Grimacing;Guarding Pain Intervention(s): Limited activity within patient's tolerance;Monitored during session;Repositioned    Home Living                      Prior Function            PT Goals (current goals can now be found in the care plan section) Acute Rehab PT Goals Patient Stated Goal: unable to state PT Goal Formulation: With family Time For Goal Achievement: 03/24/21 Potential to Achieve Goals: Fair Progress towards PT goals: Progressing toward goals    Frequency    Min 1X/week      PT Plan Current plan remains appropriate    Co-evaluation              AM-PAC PT "6 Clicks" Mobility   Outcome Measure  Help needed turning from your back to your side while in a flat bed without using bedrails?: Total Help needed moving from lying on your back to sitting on the side of a flat bed without using bedrails?: Total Help needed moving to and from a bed to a chair (including a wheelchair)?: Total Help needed standing up from a chair using your arms (e.g., wheelchair or bedside chair)?: Total Help needed to walk in hospital room?: Total Help needed climbing 3-5 steps with a railing? : Total 6 Click Score: 6    End of Session Equipment Utilized During Treatment: Oxygen Activity Tolerance: Patient tolerated treatment well Patient left: in chair;with call bell/phone within reach;with chair alarm set;with family/visitor present;with nursing/sitter in room;with restraints reapplied Manufacturing engineer in room assisting in feeding her.) Nurse Communication: Need for lift equipment PT Visit Diagnosis: Hemiplegia and hemiparesis;Other symptoms and signs involving the nervous system (R29.898);Other abnormalities of gait and mobility (R26.89) Hemiplegia - Right/Left: Left Hemiplegia - dominant/non-dominant:  Non-dominant Hemiplegia - caused by: Cerebral infarction;Nontraumatic intracerebral hemorrhage     Time: 3419-6222 PT Time Calculation (min) (ACUTE ONLY): 60 min  Charges:  $Therapeutic Activity: 38-52 mins $Neuromuscular Re-education: 8-22 mins                     Corinna Capra, PT, DPT  Acute Rehabilitation Ortho Tech Supervisor [phone] pager [phone]) [phone] office

## 2021-03-11 LAB — GLUCOSE, CAPILLARY
Glucose-Capillary: 117 mg/dL — ABNORMAL HIGH (ref 70–99)
Glucose-Capillary: 160 mg/dL — ABNORMAL HIGH (ref 70–99)

## 2021-03-11 NOTE — Progress Notes (Signed)
[facility] PROGRESS NOTE  [name]:[id number] DOB: [birth year] DOA: 08/26/2020 PCP: [name], [name] (Inactive)    October 08, 2020     10/16/2020                        10/16/2020     10/18/2020                        10/20/2020      12/30/2020                   12/30/2020     01/04/2021                   02/24/2021 Viv really needs a teddy bear!   Status: Remains inpatient appropriate because:Altered mental status and Unsafe d/c plan: Requirement to discharge to trach capable SNF   Dispo: [name] is from: Home              Anticipated d/c is to: SNF trach capable-family prefers facility in [facility] but no beds available-we have recently discovered that [facility] may have a trach bed available in clinicals have been sent to [facility] for review -trach team re eval 7/25 and rec NOT to decannulate 2/2 and lack of appropriate voluntary and involuntary cough response              [name] currently is medically stable to d/c.   Difficult to place [name] Yes   Level of care: Telemetry Medical  Code Status: Full Family Communication: [name] [phone]) is her POA and legal contact.  [name] by phone 8/19.  Mother at bedside 8/15-Dtr [name] 8/19 DVT prophylaxis: Chronically bedbound.  5/6: We will discontinue prophylactic Lovenox   Vaccination status: Unvaccinated-husband still undecided.  Micro Data:  2/9 COVID + Influenza negative 2/10 MRSA PCR negative 2/12 BCx 2 > no growth  2/19 BC 1/2 +Stap EPI, 1/2 no growth  10/07/2020 sputum positive for Serratia marcescens 4/8 Enterobacter cloaca + urine culture MDR sensitive only to imipenem and gentamicin   Antimicrobials:  Cefepime 2/12 > 2/15 Vanc 2/12 > 2/15 10/08/2020 ciprofloxacin>>3/30 Unasyn 3/30 >> 4/02 Rocephin 4/11 > 4/12 Meropenem 4/12>4/17 Meropenem 5/20 through 5/22 Ceftriaxone 5/22 through 5/26 Meropenem 6/3 Ceftriaxone 6/4 through 6/10 Septra 7/4 through 7/7 Ceftriaxone 7/29 was  1 dose followed by 1 dose of fosfomycin   HPI: 55 yo F with 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  Subsequently on 2/10, she developed obtundation and respiratory arrest leading to intubation.  She has remained persistently obtunded, now has a trach and a PEG.  Hospital course complicated by persistent fever, Serratia pneumonia and now pre-orbital cellulitis  As of 3/25 with adjustments in hypertonicity medications [name] began to improve regarding hypertonicity.  Eventually started on provigil with improvements in alertness.  Evaluated by rehab physician who recommended increasing provigil dose further.  Subsequently [name] has become more alert.  Most days she is able to track and with encouragement able to follow simple commands especially since partial obstruction glasses placed.  See progress note dictated 4/27 for expanded details.   Significant events: 2/09 Admit for ICH 2/10 EVD placed, later intubated due to obtundation and respiratory arrest 2/11 MRI Brain, hypertonic saline d/c'd, EVD not working.  Husband did not consent to PICC  2/13 EVD removed 2/17 GOC discussion with family, PMT 2/24 Bedside  trach and PEG 3/1 Transferred to [facility]  Subjective: Mother is at bedside.  [name] is alert and quite interactive.  She has waved at me since my arrival to [facility] room and smiled.  Mother states that Kalkidan was playing tic-tac-toe yesterday and did not require prompting to participate in [facility] game.  Both mom and staff state that during attempts to swallow food Mayola points to her throat and is able to convey that swallowing is somewhat painful or uncomfortable.  Objective: Vitals:   03/11/21 0400 03/11/21 0635  BP: 121/75 117/68  Pulse: 82   Resp: 18   Temp:    SpO2: 100%    Filed Weights   03/09/21 0447 03/10/21 0500 03/11/21 0500  Weight: 67.3 kg 66.4 kg 61.5 kg    Exam:       Gen: Awake and  alert, NAD except for discomfort with swallowing Respiratory: #4.0 cuffless trach, trach collar with FiO2 21% at 5L; respiratory status is stable with normal pulse oximetry, lungs are clear and no increased work of breathing.  Trach tube site unremarkable  Cardiovascular: Normotensive, regular pulse, no peripheral edema, normal heart Abdomen:  PEG /nocturnal tube feedings. LBM 8/21, often nondistended, normoactive bowel sounds. Neurological: CN 2 -12 intact except for suspected hearing loss post stroke. Spontaneous purposeful movement of right upper extremity/strength 4/5.   RLE strength 2/5 with purposeful movement.  Has left-side neglect.   Psychiatric: Awake.  Very interactive today.  Smiling and waving.  Was able to participate in several games of tic-tac-toe on 8/24 and did not require prompting to participate.  Assessment/Plan: Acute problems: Multiple acute strokes  Right Thalamus ICH with IVH  Obstructive Hydrocephalus/left-sided neglect /Non TBI related hypertonicity -8/19 recently decreased baclofen to twice daily.  Subsequently decreased to HS given daytime sleepiness.  Possibly can discontinue this medication in [facility] middle of this week if does not develop any hypertonicity symptoms. 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 On 8/16 Dr. Dewald/PCCM discussed with [name]'s husband [name] regarding risks and benefits for decannulation.  At this time [name] and [name]'s mother agree that [facility] risks outweigh [facility] benefits and would like for [place] to have more neurological recovery before consideration for decannulation. -Not a candidate for decannulation given lack of significant improvement thus far in terms of consistent cough strength and ability for  her to cognitively understand and act on instructions to cough plus signs of impulsiveness and ongoing aspiration -[name] having throat/neck pain after multiple self decannulation's in [facility] past several weeks.  Continue scheduled Tylenol and as needed Ultram  Constipation -Currently on MiraLAX prn noting if given regularly [name] has diarrhea -Last bowel movement documented as 8/1 so we will give one-time dose of Chronulac per tube  Hypertensive emergency/Malignant HTN -Continue Norvasc, beta-blocker and hydralazine.  Dysphagia -Continue dysphagia 1 with thin liquids -Inconsistent oral intake therefore has been resumed on nocturnal tube feedings -[name] complaining of painful or at least uncomfortable swallowing in context of trach  History of urinary retention -Continue Cardura    Other problems: Enterobacter cystitis -Urine culture with Enterobacter resistant to cefazolin, cefepime and Septra. -Previously given 2 days of IV Rocephin and since no signs of sepsis was given a one-time dose of fosfomycin over [facility] weekend -Purewick dc'd due to contributing to recurrent UTIs  Right upper extremity  thrombophlebitis - [name] -> age indeterminate SVT of right cephalic vein  Neck Contracture/torticollis -- Resolved  Recurrent right knee pain/known ligamentous injury and chronic osteoarthritis -s/p injection of Marcaine and Depomedrol 6/30 -Continue scheduled Celebrex, Tylenol and Ultram  ESBL positive Enterobacter UTI -Has completed 5 days of meropenem -Contact isolation during hospitalization -continue Oxybutynin suspected bladder spasms  Klebsiella UTI -Resistant to nitrofurantoin and ampicillin -To minimize risk of recurrent UTI have asked that pure wick only be used at bedtime on a 10 PM to 8 AM schedule  Recurrent diarrhea -Resolved  Bacterial Conjunctivitis /periorbital cellulitis -Resolved  Chronic pansinusitis/recurrent fevers 2/2 aspiration PNA;History of Serratia  Pneumonia  Hospital Acquired Pneumonia:  -Completed several courses of antibiotics -Follow-up imaging including sinus CT consistent with chronic but stable and resolving sinusitis   Anemia -Stable  HLD -Continue Lipitor      Data Reviewed: Basic Metabolic Panel: No results for input(s): NA, K, CL, CO2, GLUCOSE, BUN, CREATININE, CALCIUM, MG, PHOS in [facility] last 168 hours.    Liver Function Tests: No results for input(s): AST, ALT, ALKPHOS, BILITOT, PROT, ALBUMIN in [facility] last 168 hours.     CBC: No results for input(s): WBC, NEUTROABS, HGB, HCT, MCV, PLT in [facility] last 168 hours.     CBG: Recent Labs  Lab 03/08/21 0550 03/09/21 0204 03/09/21 0635 03/10/21 0341 03/10/21 0625  GLUCAP 142* 130* 171* 133* 137*     Studies: No results found.   Scheduled Meds:  acetaminophen  650 mg Per Tube Q6H   amLODipine  10 mg Per Tube Daily   atorvastatin  40 mg Per Tube Daily   baclofen  5 mg Per Tube QHS   carvedilol  25 mg Per Tube BID WC   celecoxib  200 mg Per Tube BID   chlorhexidine gluconate (MEDLINE KIT)  15 mL Mouth Rinse BID   Chlorhexidine Gluconate Cloth  6 each Topical Daily   diclofenac Sodium  4 g Topical QID   doxazosin  2 mg Per Tube Daily   feeding supplement  237 mL Oral TID BM   feeding supplement (JEVITY 1.5 CAL/FIBER)  750 mL Per Tube Q24H   feeding supplement (PROSource TF)  45 mL Per Tube Daily   free water  200 mL Per Tube Q4H   hydrALAZINE  37.5 mg Per Tube Q8H   mouth rinse  15 mL Mouth Rinse 5 X Daily   modafinil  200 mg Per Tube Daily   multivitamin with minerals  1 tablet Per Tube Daily   pantoprazole sodium  40 mg Per Tube Daily   Continuous Infusions:  sodium chloride Stopped (10/26/20 1445)    Principal Problem:   ICH (intracerebral hemorrhage) (HCC) Active Problems:   Acute hypoxemic respiratory failure (HCC)   Tracheostomy dependent (HCC)   Palliative care by specialist   Muscle hypertonicity   Oropharyngeal dysphagia   Poor  prognosis   Aspiration pneumonia of right lower lobe (HCC)   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  Palliative care.   PCCM continues to follow.  No decannulation.   Procedures: Tracheostomy PEG tube EEG Echocardiogram  Antibiotics: Vancomycin 2/12-2/15 Maxipime 2/12-2/15 Ancef 2/19-2/27 Vancomycin 2/20 x 1 dose Maxipime 2/27- 3/5 Vancomycin 3/1-3/3 Ancef 3/6-3/14 Cipro 3/20 4-3/30 Unasyn 3/30-4/4 Rocephin 4/11-4/12 Meropenem 4/12-4/17  Time spent: 15 minutes    Erin Hearing ANP  [facility] 7 am - 330 pm/M-F for direct [name] care and secure chat Please refer to [place] for contact info 196  Days

## 2021-03-12 LAB — GLUCOSE, CAPILLARY: Glucose-Capillary: 119 mg/dL — ABNORMAL HIGH (ref 70–99)

## 2021-03-12 NOTE — Progress Notes (Signed)
Occupational Therapy Treatment Patient Details Name: [name] MRN: [id number] DOB: 29-May-1966 Today's Date: 03/12/2021    History of present illness Pt is 55 y/o female presents to [facility] on 08/26/20 with L sided weakness and headaches. 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 s/p Right frontal ventricular catheter placement on 2/10, stopped functioning on 2/11.  MRI 2/11 shows right greater than left mesial temporal lobes / parahippocampal cortex, bilateral basal ganglia, and possibly the upper pons that are concerning for acute infarcts, most likely secondary to mass effect from the hemorrhage. ETT 2/10-2/24.  Pt with trach and PEG on 09/10/20. Hospital course complicated by persistent fever, Serratia pneumonia and now pre-orbital cellulitis.   Pt self decannulated 02/04/21, trach re-inserted by RT. Pt dislodged PEG, guided replacement 8/2.  PMH includes anxiety, HTN.   OT comments  Patient with poor progress toward goals.  Patient unable to sit at the edge of the bed for more than 3 minutes.  Patient pushing back and leaning toward the right.  Patient returned to bed with Max A.  Attempted supported grooming, but patient not responding to attempts.  Goals and plan of care will need to be reviewed Monday of next week.  It may be better to have a consistent OT, as I'm not sure she was responding well to a female OT.  Patient will need 24 hour heavy care, and so SNF is recommended.    Follow Up Recommendations  SNF;Supervision/Assistance - 24 hour    Equipment Recommendations  None recommended by OT    Recommendations for Other Services      Precautions / Restrictions Precautions Precautions: Fall Precaution Comments: peg and trach, R mitt       Mobility Bed Mobility Overal bed mobility: Needs Assistance Bed Mobility: Rolling Rolling: Mod  assist;Max assist Sidelying to sit: Mod assist;Max assist     Sit to sidelying: Max assist   Patient Response: Flat affect  Transfers                      Balance Overall balance assessment: Needs assistance Sitting-balance support: Feet unsupported Sitting balance-Leahy Scale: Poor                                     ADL either performed or assessed with clinical judgement   ADL       Grooming: Minimal assistance;Wash/dry face;Moderate assistance  Progress Toward Goals  OT Goals(current goals can now be found in the care plan section)  Progress towards OT goals: Not progressing toward goals - comment  Acute Rehab OT Goals Patient Stated Goal: unable to state OT Goal Formulation: Patient unable to participate in goal setting Time For Goal Achievement: 03/17/21 Potential to Achieve Goals: Fair  Plan      Co-evaluation                 AM-PAC OT "6 Clicks" Daily Activity     Outcome Measure   Help from another person eating meals?: A Lot Help from another person taking care of personal grooming?: A Lot Help from another person toileting, which includes using toliet, bedpan, or urinal?: Total Help from another person bathing (including washing, rinsing, drying)?: Total Help from another person to put on and taking off regular upper body clothing?: A Lot Help from another person to put on and taking off regular lower body clothing?: Total 6 Click Score: 9    End of Session Equipment Utilized During Treatment: Oxygen  OT Visit Diagnosis: Muscle weakness (generalized) (M62.81);Apraxia (R48.2);Cognitive communication deficit (R41.841);Hemiplegia and  hemiparesis Symptoms and signs involving cognitive functions: Other Nontraumatic ICH Hemiplegia - Right/Left: 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

## 2021-03-12 NOTE — Progress Notes (Signed)
[facility] PROGRESS NOTE  Charlene [name] PTW:[id number] DOB: [birth year] DOA: 08/26/2020 PCP: Patient, [name] (Inactive)    October 08, 2020     10/16/2020                        10/16/2020     10/18/2020                        10/20/2020      12/30/2020                   12/30/2020     01/04/2021                   02/24/2021 Viv really needs a teddy bear!   Status: Remains inpatient appropriate because:Altered mental status and Unsafe d/c plan: Requirement to discharge to trach capable SNF   Dispo: The patient is from: Home              Anticipated d/c is to: SNF trach capable-[place]-we have recently discovered that the [facility] may have a trach bed available in clinicals have been sent to the facility for review -trach team re eval 7/25 and rec NOT to decannulate 2/2 and lack of appropriate voluntary and involuntary cough response              Patient currently is medically stable to d/c.   Difficult to place patient Yes   Level of care: Telemetry Medical  Code Status: Full Family Communication: [name] [phone]) is her POA and legal contact.  [name] by phone 8/19.  Mother at bedside 8/15-Dtr [name] 8/19 DVT prophylaxis: Chronically bedbound.  5/6: We will discontinue prophylactic Lovenox   Vaccination status: Unvaccinated-husband still undecided.  Micro Data:  2/9 COVID + Influenza negative 2/10 MRSA PCR negative 2/12 BCx 2 > no growth  2/19 BC 1/2 +Stap EPI, 1/2 no growth  10/07/2020 sputum positive for Serratia marcescens 4/8 Enterobacter cloaca + urine culture MDR sensitive only to imipenem and gentamicin   Antimicrobials:  Cefepime 2/12 > 2/15 Vanc 2/12 > 2/15 10/08/2020 ciprofloxacin>>3/30 Unasyn 3/30 >> 4/02 Rocephin 4/11 > 4/12 Meropenem 4/12>4/17 Meropenem 5/20 through 5/22 Ceftriaxone 5/22 through 5/26 Meropenem 6/3 Ceftriaxone 6/4 through 6/10 Septra 7/4 through 7/7 Ceftriaxone 7/29 was  1 dose followed by 1 dose of fosfomycin   HPI: 55 yo F with 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  Subsequently on 2/10, she developed obtundation and respiratory arrest leading to intubation.  She has remained persistently obtunded, now has a trach and a PEG.  Hospital course complicated by persistent fever, Serratia pneumonia and now pre-orbital cellulitis  As of 3/25 with adjustments in hypertonicity medications patient began to improve regarding hypertonicity.  Eventually started on provigil with improvements in alertness.  Evaluated by rehab physician who recommended increasing provigil dose further.  Subsequently patient has become more alert.  Most days she is able to track and with encouragement able to follow simple commands especially since partial obstruction glasses placed.  See progress note dictated 4/27 for expanded details.   Significant events: 2/09 Admit for ICH 2/10 EVD placed, later intubated due to obtundation and respiratory arrest 2/11 MRI Brain, hypertonic saline d/c'd, EVD not working.  Husband did not consent to PICC  2/13 EVD removed 2/17 GOC discussion with family, PMT 2/24 Bedside  trach and PEG 3/1 Transferred to [facility]  Subjective: Alert.  Staff at bedside cleaning and turning patient and repositioning.  Kaily without any specific request when asked.  Objective: Vitals:   03/12/21 0653 03/12/21 0700  BP: (!) 138/93 135/84  Pulse:  90  Resp:    Temp:  97.9 F (36.6 C)  SpO2:  99%   Filed Weights   03/10/21 0500 03/11/21 0500 03/12/21 0500  Weight: 66.4 kg 61.5 kg 64.8 kg    Exam:       Gen: Awake and alert, NAD except for discomfort with swallowing Respiratory: #4.0 cuffless trach, trach collar with FiO2 21% at 5L; bilateral lung sounds remain clear.  No increased work of breathing Cardiovascular: Regular pulse, extremities are warm to touch, she is normotensive.   Heart sounds remain normal. Abdomen:  PEG /nocturnal tube feedings. LBM 8/21, nondistended, normoactive bowel sounds. Neurological: CN 2 -12 intact except for suspected hearing loss post stroke. Spontaneous purposeful movement of right upper extremity/strength 4/5.   RLE strength 2/5 with purposeful movement.  Has left-side neglect.   Psychiatric: Awake.  Very interactive today.  Smiling and waving.  Was able to participate in several games of tic-tac-toe on 8/24 and did not require prompting to participate.  Assessment/Plan: Acute problems: Multiple acute strokes  Right Thalamus ICH with IVH  Obstructive Hydrocephalus/left-sided neglect /Non TBI related hypertonicity -8/19 recently decreased baclofen to twice daily.  Subsequently decreased to HS given daytime sleepiness.  Possibly can discontinue this medication in the middle of this week if does not develop any hypertonicity symptoms. 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 On 8/16 Dr. Dewald/PCCM discussed with patient's husband [name] regarding risks and benefits for decannulation.  At this time [name] and patient's mother agree that the risks outweigh the benefits and would like for Patra to have more neurological recovery before consideration for decannulation. -Not a candidate for decannulation given lack of significant improvement thus far in terms of consistent cough strength and ability for her to cognitively understand and act on instructions to cough plus signs of impulsiveness and ongoing aspiration -Patient having throat/neck pain after multiple self decannulation's in the past several weeks.  Continue scheduled Tylenol and as needed Ultram  Constipation -On as needed MiraLAX -occasionally  requires a dose of milk of magnesia x1  Hypertensive emergency/Malignant HTN -Continue Norvasc, beta-blocker and hydralazine.  Dysphagia -Continue dysphagia 1 with thin liquids -Inconsistent oral intake therefore has been resumed on nocturnal tube feedings -Patient complaining of painful or at least uncomfortable swallowing in context of trach  History of urinary retention -Continue Cardura    Other problems: Enterobacter cystitis -Urine culture with Enterobacter resistant to cefazolin, cefepime and Septra. -Previously given 2 days of IV Rocephin and since no signs of sepsis was given a one-time dose of fosfomycin over the weekend -Purewick dc'd due to contributing to recurrent UTIs  Right upper extremity thrombophlebitis - [name] -> age indeterminate SVT of right cephalic vein  Neck Contracture/torticollis -- Resolved  Recurrent right knee pain/known ligamentous injury and chronic osteoarthritis -s/p injection of Marcaine and Depomedrol 6/30 -Continue scheduled Celebrex, Tylenol and Ultram  ESBL positive Enterobacter UTI -Has completed 5 days of meropenem -Contact isolation during hospitalization -continue Oxybutynin suspected bladder spasms  Klebsiella UTI -Resistant to nitrofurantoin and ampicillin -To minimize risk  of recurrent UTI have asked that pure wick only be used at bedtime on a 10 PM to 8 AM schedule  Recurrent diarrhea -Resolved  Bacterial Conjunctivitis /periorbital cellulitis -Resolved  Chronic pansinusitis/recurrent fevers 2/2 aspiration PNA;History of Serratia Pneumonia  Hospital Acquired Pneumonia:  -Completed several courses of antibiotics -Follow-up imaging including sinus CT consistent with chronic but stable and resolving sinusitis   Anemia -Stable  HLD -Continue Lipitor      Data Reviewed: Basic Metabolic Panel: No results for input(s): NA, K, CL, CO2, GLUCOSE, BUN, CREATININE, CALCIUM, MG, PHOS in the last 168 hours.    Liver  Function Tests: No results for input(s): AST, ALT, ALKPHOS, BILITOT, PROT, ALBUMIN in the last 168 hours.     CBC: No results for input(s): WBC, NEUTROABS, HGB, HCT, MCV, PLT in the last 168 hours.     CBG: Recent Labs  Lab 03/09/21 0635 03/10/21 0341 03/10/21 0625 03/11/21 0352 03/11/21 0632  GLUCAP 171* 133* 137* 117* 160*     Studies: No results found.   Scheduled Meds:  acetaminophen  650 mg Per Tube Q6H   amLODipine  10 mg Per Tube Daily   atorvastatin  40 mg Per Tube Daily   baclofen  5 mg Per Tube QHS   carvedilol  25 mg Per Tube BID WC   celecoxib  200 mg Per Tube BID   chlorhexidine gluconate (MEDLINE KIT)  15 mL Mouth Rinse BID   Chlorhexidine Gluconate Cloth  6 each Topical Daily   diclofenac Sodium  4 g Topical QID   doxazosin  2 mg Per Tube Daily   feeding supplement  237 mL Oral TID BM   feeding supplement (JEVITY 1.5 CAL/FIBER)  750 mL Per Tube Q24H   feeding supplement (PROSource TF)  45 mL Per Tube Daily   free water  200 mL Per Tube Q4H   hydrALAZINE  37.5 mg Per Tube Q8H   mouth rinse  15 mL Mouth Rinse 5 X Daily   modafinil  200 mg Per Tube Daily   multivitamin with minerals  1 tablet Per Tube Daily   pantoprazole sodium  40 mg Per Tube Daily   Continuous Infusions:  sodium chloride Stopped (10/26/20 1445)    Principal Problem:   ICH (intracerebral hemorrhage) ([place]) Active Problems:   Acute hypoxemic respiratory failure (HCC)   Tracheostomy dependent ([place])   Palliative care by specialist   Muscle hypertonicity   Oropharyngeal dysphagia   Poor prognosis   Aspiration pneumonia of right lower lobe (HCC)   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  Palliative care.   PCCM continues to  follow.  No decannulation.   Procedures: Tracheostomy PEG tube EEG Echocardiogram  Antibiotics: Vancomycin 2/12-2/15 Maxipime 2/12-2/15 Ancef 2/19-2/27 Vancomycin 2/20 x 1 dose Maxipime 2/27- 3/5 Vancomycin 3/1-3/3 Ancef 3/6-3/14 Cipro 3/20 4-3/30 Unasyn 3/30-4/4 Rocephin 4/11-4/12 Meropenem 4/12-4/17   Time spent: 15 minutes    Erin Hearing ANP  [facility] 7 am - 330 pm/M-F for direct patient care and secure chat Please refer to [place] for contact info 197  Days

## 2021-03-13 NOTE — Progress Notes (Signed)
PROGRESS NOTE  Charlene [name] BHA:[id number] DOB: 1965/10/11 DOA: 08/26/2020 PCP: Patient, [name] (Inactive)   LOS: 198 days   Brief narrative:  55 yo F with 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  Subsequently on 2/10, she developed obtundation and respiratory arrest leading to intubation.  She has remained persistently obtunded, now has a trach and a PEG.  Hospital course complicated by persistent fever, Serratia pneumonia and now pre-orbital cellulitis. As of 3/25 with adjustments in hypertonicity medications patient began to improve regarding hypertonicity.  Eventually started on provigil with improvements in alertness.  Evaluated by rehab physician who recommended increasing provigil dose further.  Subsequently patient has become more alert.  Most days she is able to track and with encouragement able to follow simple commands especially since partial obstruction glasses placed.    Assessment/Plan:  Principal Problem:   ICH (intracerebral hemorrhage) (HCC) Active Problems:   Acute hypoxemic respiratory failure (HCC)   Tracheostomy dependent (HCC)   Palliative care by specialist   Muscle hypertonicity   Oropharyngeal dysphagia   Poor prognosis   Aspiration pneumonia of right lower lobe (HCC)   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   Acute cystitis without hematuria   Ileus, unspecified (HCC)   Constipation   Knee pain, right   Nonspecific abnormal electrocardiogram (ECG) (EKG)   Multiple acute strokes  Right Thalamus ICH with IVH  Obstructive Hydrocephalus/left-sided neglect /Non TBI related hypertonicity  On baclofen.  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   Constipation On  MiraLAX.   Hypertensive emergency/Malignant HTN -Continue Norvasc, beta-blocker and hydralazine.   Dysphagia -Continue dysphagia 1 with thin liquids Mostly on nocturnal tube feedings    History of urinary retention -Continue Cardura   Enterobacter cystitis Treated   Right upper extremity thrombophlebitis Stable   Neck Contracture/torticollis Improved   Recurrent right knee pain/known ligamentous injury and chronic osteoarthritis -s/p injection of Marcaine and Depomedrol 6/30 -Continue scheduled Celebrex, Tylenol and Ultram   ESBL positive Enterobacter UTI -Has completed 5 days of meropenem -Contact isolation during hospitalization -continue Oxybutynin suspected bladder spasms    Recurrent diarrhea -Resolved   Chronic pansinusitis/recurrent fevers 2/2 aspiration PNA;History of Serratia Pneumonia  Hospital Acquired Pneumonia:  -Completed several courses of antibiotics  Anemia -Stable   HLD -Continue Lipitor  DVT prophylaxis: SCD's Start: 08/27/20 0026    Code Status: Full code  Family Communication: None  Status is: Inpatient  Remains inpatient appropriate because:Unsafe d/c plan, IV treatments appropriate due to intensity of illness or inability to take PO, and Inpatient level of care appropriate due to severity of illness  Dispo: The patient is from: Home              Anticipated d/c is to: SNF              Patient currently is not medically stable to d/c.   Difficult to place patient Yes  Consultants: Neurology  Neurosurgery.  Palliative care.   PCCM continues to follow  Procedures: Tracheostomy PEG tube EEG Echocardiogram  Anti-infectives:  None currently   Subjective: Today, patient was seen and examined at bedside.  No interval complaints reported.  Objective: Vitals:   03/13/21 0903 03/13/21 0958  BP:  (!) 148/92  Pulse: 89 86  Resp: 18   Temp:  SpO2: 95%     Intake/Output Summary (Last 24 hours) at 03/13/2021 1140 Last  data filed at 03/13/2021 0615 Gross per 24 hour  Intake 580 ml  Output --  Net 580 ml   Filed Weights   03/10/21 0500 03/11/21 0500 03/12/21 0500  Weight: 66.4 kg 61.5 kg 64.8 kg   Body mass index is 23.77 kg/m.   Physical Exam: GENERAL: Patient is alert awake Not in obvious distress.  Squeezes hands HENT trach collar in place. NECK: is supple, no gross swelling noted. CHEST: Clear to auscultation. No crackles or wheezes.  Diminished breath sounds bilaterally. CVS: S1 and S2 heard, no murmur. Regular rate and rhythm.  ABDOMEN: Soft, PEG tube in place. EXTREMITIES: No edema.  Mittens. CNS: Opens eyes, alert awake, left-sided neglect. SKIN: warm and dry without rashes.  Data Review: I have personally reviewed the following laboratory data and studies,  CBC: No results for input(s): WBC, NEUTROABS, HGB, HCT, MCV, PLT in the last 168 hours. Basic Metabolic Panel: No results for input(s): NA, K, CL, CO2, GLUCOSE, BUN, CREATININE, CALCIUM, MG, PHOS in the last 168 hours. Liver Function Tests: No results for input(s): AST, ALT, ALKPHOS, BILITOT, PROT, ALBUMIN in the last 168 hours. No results for input(s): LIPASE, AMYLASE in the last 168 hours. No results for input(s): AMMONIA in the last 168 hours. Cardiac Enzymes: No results for input(s): CKTOTAL, CKMB, CKMBINDEX, TROPONINI in the last 168 hours. BNP (last 3 results) No results for input(s): BNP in the last 8760 hours.  ProBNP (last 3 results) No results for input(s): PROBNP in the last 8760 hours.  CBG: Recent Labs  Lab 03/10/21 0341 03/10/21 0625 03/11/21 0352 03/11/21 0632 03/12/21 0611  GLUCAP 133* 137* 117* 160* 119*   No results found for this or any previous visit (from the past 240 hour(s)).   Studies: No results found.    Joycelyn Das, MD  [facility] 03/13/2021  If 7PM-7AM, please contact night-coverage

## 2021-03-14 NOTE — Progress Notes (Signed)
PROGRESS NOTE  Charlene [name] URK:[id number] DOB: 14-Mar-1966 DOA: 08/26/2020 PCP: Patient, [name] (Inactive)   LOS: 199 days   Brief narrative: 55 yo female with hypertensive Right thalamic/basal ganglia ICH with resultant hydrocephalus prompting external ventricular drain placement on 2/10.  Subsequently, on 2/10, she developed obtundation and respiratory arrest leading to intubation.  She has remained persistently obtunded, now has a trach and a PEG.  Hospital course complicated by persistent fever, Serratia pneumonia and now pre-orbital cellulitis. As of 3/25 with adjustments in hypertonicity medications patient began to improve regarding hypertonicity.  Eventually started on provigil with improvements in alertness.  Evaluated by rehab physician who recommended increasing provigil dose further.  Subsequently patient has become more alert.  Most days she is able to track and with encouragement able to follow simple commands especially since partial obstruction glasses placed.    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   Tracheostomy dependent -Tracheostomy team following.   Constipation On MiraLAX.   Hypertensive emergency/Malignant HTN -Continue Norvasc, beta-blocker and  hydralazine.   Dysphagia -Continue dysphagia 1 with thin liquids. Mostly on nocturnal tube feedings    History of urinary retention -Continue Cardura   Enterobacter cystitis Treated   Right upper extremity thrombophlebitis Stable   Neck Contracture/torticollis Improved   Recurrent right knee pain/known ligamentous injury and chronic osteoarthritis -s/p injection of Marcaine and Depomedrol 6/30 -Continue scheduled Celebrex, Tylenol and Ultram   ESBL positive Enterobacter UTI -Has completed 5 days of meropenem -Contact isolation during hospitalization -continue Oxybutynin suspected bladder spasms   Recurrent diarrhea -Resolved   Chronic pansinusitis/recurrent fevers 2/2 aspiration PNA;History of Serratia Pneumonia  Hospital Acquired Pneumonia:  -Completed several courses of antibiotics  Anemia -Stable   Hyperlipidemia -Continue Lipitor  DVT prophylaxis: SCD's Start: 08/27/20 0026  Code Status: Full code  Family Communication: None  Status is: Inpatient  Remains inpatient appropriate because:Unsafe d/c plan, IV treatments appropriate due to intensity of illness or inability to take PO, and Inpatient level of care appropriate due to severity of illness  Dispo: The patient is from: Home              Anticipated d/c is to: SNF              Patient currently is not medically stable to d/c.   Difficult to place patient Yes  Consultants: Neurology  Neurosurgery.  Palliative care.   PCCM   Procedures: Tracheostomy PEG tube EEG 2D echocardiogram  Anti-infectives: None currently  Subjective: Today, patient was seen and examined at bedside.  No interval complaints reported  Objective: Vitals:   03/14/21 1204 03/14/21 1208  BP:    Pulse: 83   Resp: 18 18  Temp:  98.9 F (37.2 C)  SpO2: 100%     Intake/Output Summary (Last 24 hours) at 03/14/2021 1416 Last data filed at 03/14/2021  7619 Gross per 24 hour  Intake 120 ml  Output --  Net 120 ml     Filed Weights   03/10/21 0500 03/11/21 0500 03/12/21 0500  Weight: 66.4 kg 61.5 kg 64.8 kg   Body mass index is 23.77 kg/m.   Physical Exam: GENERAL: Patient is alert awake Not in obvious distress.  Squeezes hands HENT trach collar in place. NECK: is supple, no gross swelling noted. CHEST: Clear to auscultation. No crackles or wheezes.  Diminished breath sounds bilaterally. CVS: S1 and S2 heard, no murmur. Regular rate and rhythm.  ABDOMEN: Soft, PEG tube in place. EXTREMITIES: No edema.  Mittens. CNS: Opens eyes, alert awake, left-sided neglect. SKIN: warm and dry without rashes.  Data Review: I have personally reviewed the following laboratory data and studies,  CBC: No results for input(s): WBC, NEUTROABS, HGB, HCT, MCV, PLT in the last 168 hours. Basic Metabolic Panel: No results for input(s): NA, K, CL, CO2, GLUCOSE, BUN, CREATININE, CALCIUM, MG, PHOS in the last 168 hours. Liver Function Tests: No results for input(s): AST, ALT, ALKPHOS, BILITOT, PROT, ALBUMIN in the last 168 hours. No results for input(s): LIPASE, AMYLASE in the last 168 hours. No results for input(s): AMMONIA in the last 168 hours. Cardiac Enzymes: No results for input(s): CKTOTAL, CKMB, CKMBINDEX, TROPONINI in the last 168 hours. BNP (last 3 results) No results for input(s): BNP in the last 8760 hours.  ProBNP (last 3 results) No results for input(s): PROBNP in the last 8760 hours.  CBG: Recent Labs  Lab 03/10/21 0341 03/10/21 0625 03/11/21 0352 03/11/21 0632 03/12/21 0611  GLUCAP 133* 137* 117* 160* 119*    No results found for this or any previous visit (from the past 240 hour(s)).   Studies: No results found.    Joycelyn Das, MD  [facility] 03/14/2021  If 7PM-7AM, please contact night-coverage

## 2021-03-15 LAB — GLUCOSE, CAPILLARY
Glucose-Capillary: 128 mg/dL — ABNORMAL HIGH (ref 70–99)
Glucose-Capillary: 149 mg/dL — ABNORMAL HIGH (ref 70–99)
Glucose-Capillary: 92 mg/dL (ref 70–99)
Glucose-Capillary: 123 mg/dL — ABNORMAL HIGH (ref 70–99)
Glucose-Capillary: 160 mg/dL — ABNORMAL HIGH (ref 70–99)

## 2021-03-15 NOTE — Progress Notes (Addendum)
NAME:  Charlene [name], MRN:  [id number], DOB:  May 20, 1966, LOS: 200 ADMISSION DATE:  08/26/2020, CONSULTATION DATE:  2/10 REFERRING MD:  Charlene [name], CHIEF COMPLAINT:  headache   History of Present Illness:  55 year old female admitted with hypertensive R thalamic/basal ganglia ICH. Resultant obstructive hydrocephalus prompting EVD placement 2/10. Subsequently, 2/10 she had acute mental status change to obtundation and respiratory arrest resulting in intubation. Trach and Peg 2/24. Trach change to #6 cuffless on 3/11.   Hospital course complicated by Serratia pneumonia, ESBL UTI, preorbital cellulitis  Pertinent  Medical History  Hypertension, Medication non-compliance  Significant Hospital Events: Including procedures, antibiotic start and stop dates in addition to other pertinent events   2/09 Admit for ICH 2/10 EVD placed, later intubated due to obtundation and respiratory arrest 2/11 MRI Brain, hypertonic saline d/c'd, EVD not working.  Husband did not consent to PICC  2/13 EVD removed 2/17 GOC discussion with family, PMT 2/24 tracheostomy placed by Dr. Denese [name], PEG placed by Dr. Sheliah [name] 3/11 trach changed to Shiley 6 uncuffed 3/21 sputum culture with Serratia resistant to Ancef 4/18 28% ATC / 5L  5/08 downsized trach to 4 7/11 No significant secretions / no issues with trach 7/21 Self de-cannulated, trach replaced 7/25 ATC 21%/5L. Using PSV, some speech per SLP, eating 8/1 tolerating PMV, minimal secretions 8/22 Per SLP-- delayed cough, leaking fluids from her mouth, still needs pureed diet. Speech unintelligible, but able to attempt to say names with SLP. Has still had episodes where she is too sleepy to participate with therapies. Family at bedside today-- she thinks Charlene [name] is pretty awake today. 8/23 Pt pulled trach out, reinserted w/o difficulty  Interim History / Subjective:  ATC 28% Mother at bedside, reports no significant changes.  Pt just celebrated her 55th  birthday  Objective   Blood pressure (!) 146/87, pulse 88, temperature 98.6 F (37 C), temperature source Oral, resp. rate 18, height 5\' 5"  (1.651 m), weight 64.8 kg, last menstrual period 03/03/2014, SpO2 98 %, unknown if currently breastfeeding.    FiO2 (%):  [21 %] 21 %   Intake/Output Summary (Last 24 hours) at 03/15/2021 1337 Last data filed at 03/15/2021 0011 Gross per 24 hour  Intake 160 ml  Output --  Net 160 ml   Filed Weights   03/10/21 0500 03/11/21 0500 03/12/21 0500  Weight: 66.4 kg 61.5 kg 64.8 kg    Examination: General: chronically ill appearing adult female lying in bed in NAD  HEENT: MM pink/moist, #6 Shiley trach midline clean/dry, ATC in place Neuro: eyes open, preferential right sided gaze, hemiparesis on left, spontaneous movement on right CV: s1s2 RRR, no m/r/g PULM: non-labored at rest, does not cough on command. Lungs bilaterally clear.  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 Dysphagia Ongoing NM weakness and cognitive deficits that limit trach decannulation safely.  -Trach care per protocol -continue PMV efforts -SNF recommended per PT -SLP efforts, recommendations for dysphagia 1 diet > pt continues to have evidence of aspiration with thin liquids in large boluses -not currently a candidate for decannulation due to lack of consistent cough strength, impulsiveness, cognitive ability to understand instructions & act accordingly  -PT / OT efforts  -CXR if new symptoms, change in secretions or O2 needs   PCCM will follow up weekly. Please call in the interim  with questions.  Charlene Brim, MSN, APRN, NP-C, AGACNP-BC [facility] 03/15/2021, 1:42 PM   Please see [email or web address] for pager details.   From 7A-7P if no response, please call  [phone] After hours, please call ELink [phone]

## 2021-03-15 NOTE — Progress Notes (Signed)
[facility] PROGRESS NOTE  Charlene [name] PTW:[id number] DOB: [birth year] DOA: 08/26/2020 PCP: Patient, [name] (Inactive)    October 08, 2020     10/16/2020                        10/16/2020     10/18/2020                        10/20/2020      12/30/2020                   12/30/2020     01/04/2021                   02/24/2021 Viv really needs a teddy bear!   Status: Remains inpatient appropriate because:Altered mental status and Unsafe d/c plan: Requirement to discharge to trach capable SNF   Dispo: The patient is from: Home              Anticipated d/c is to: SNF trach capable-[place]-we have recently discovered that the [facility] may have a trach bed available in clinicals have been sent to the facility for review -trach team re eval 7/25 and rec NOT to decannulate 2/2 and lack of appropriate voluntary and involuntary cough response              Patient currently is medically stable to d/c.   Difficult to place patient Yes   Level of care: Telemetry Medical  Code Status: Full Family Communication: [name] [phone]) is her POA and legal contact.  [name] by phone 8/19.  Mother at bedside 8/15-Dtr [name] 8/19 DVT prophylaxis: Chronically bedbound.  5/6: We will discontinue prophylactic Lovenox   Vaccination status: Unvaccinated-husband still undecided.  Micro Data:  2/9 COVID + Influenza negative 2/10 MRSA PCR negative 2/12 BCx 2 > no growth  2/19 BC 1/2 +Stap EPI, 1/2 no growth  10/07/2020 sputum positive for Serratia marcescens 4/8 Enterobacter cloaca + urine culture MDR sensitive only to imipenem and gentamicin   Antimicrobials:  Cefepime 2/12 > 2/15 Vanc 2/12 > 2/15 10/08/2020 ciprofloxacin>>3/30 Unasyn 3/30 >> 4/02 Rocephin 4/11 > 4/12 Meropenem 4/12>4/17 Meropenem 5/20 through 5/22 Ceftriaxone 5/22 through 5/26 Meropenem 6/3 Ceftriaxone 6/4 through 6/10 Septra 7/4 through 7/7 Ceftriaxone 7/29 was  1 dose followed by 1 dose of fosfomycin   HPI: 55 yo F with 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  Subsequently on 2/10, she developed obtundation and respiratory arrest leading to intubation.  She has remained persistently obtunded, now has a trach and a PEG.  Hospital course complicated by persistent fever, Serratia pneumonia and now pre-orbital cellulitis  As of 3/25 with adjustments in hypertonicity medications patient began to improve regarding hypertonicity.  Eventually started on provigil with improvements in alertness.  Evaluated by rehab physician who recommended increasing provigil dose further.  Subsequently patient has become more alert.  Most days she is able to track and with encouragement able to follow simple commands especially since partial obstruction glasses placed.  See progress note dictated 4/27 for expanded details.   Significant events: 2/09 Admit for ICH 2/10 EVD placed, later intubated due to obtundation and respiratory arrest 2/11 MRI Brain, hypertonic saline d/c'd, EVD not working.  Husband did not consent to PICC  2/13 EVD removed 2/17 GOC discussion with family, PMT 2/24 Bedside  trach and PEG 3/1 Transferred to [facility]  Subjective: Awake and alert.  Interacting with her mother.  Objective: Vitals:   03/15/21 0338 03/15/21 0410  BP: 103/65   Pulse: 93   Resp: 18 18  Temp:  98.7 F (37.1 C)  SpO2: 100%    Filed Weights   03/10/21 0500 03/11/21 0500 03/12/21 0500  Weight: 66.4 kg 61.5 kg 64.8 kg    Exam:       Gen: Awake and alert, nad Respiratory: #4.0 cuffless trach, trach collar with FiO2 21% at 5L; bilateral lung sounds remain clear.  Normal work of breathing Cardiovascular: S1-S2, normotensive, no peripheral edema. Abdomen:  PEG /nocturnal tube feedings. LBM 8/25, nondistended, normoactive bowel sounds. Neurological: CN 2 -12 intact except for suspected hearing  loss post stroke. Spontaneous purposeful movement of right upper extremity/strength 4/5.   RLE strength 2/5 with purposeful movement.  Persistent left-side neglect.   Psychiatric: Awake.  Very interactive today.    Assessment/Plan: Acute problems: Multiple acute strokes  Right Thalamus ICH with IVH  Obstructive Hydrocephalus/left-sided neglect /Non TBI related hypertonicity -8/19 recently decreased baclofen to twice daily.  Subsequently decreased to HS given daytime sleepiness.  Possibly can discontinue this medication in the middle of this week if does not develop any hypertonicity symptoms. 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 On 8/16 Dr. Dewald/PCCM discussed with patient's husband [name] regarding risks and benefits for decannulation and both [name] and family agree -Not yet a candidate for decannulation given lack of significant improvement thus far in terms of consistent cough strength and ability for her to cognitively understand and act on instructions to cough plus signs of impulsiveness and ongoing aspiration -Patient having throat/neck pain after multiple self decannulation's in the past several weeks.  Continue scheduled Tylenol and as needed Ultram  Constipation -On as needed MiraLAX -occasionally requires a dose of milk of magnesia x1  Hypertensive emergency/Malignant HTN -Continue Norvasc, beta-blocker and hydralazine.  Dysphagia -Continue dysphagia 1 with thin liquids -Inconsistent oral intake therefore has been resumed on nocturnal tube feedings -Patient complaining of painful or at least uncomfortable swallowing in context of trach  History of urinary retention -Continue Cardura    Other problems: Enterobacter  cystitis -Urine culture with Enterobacter resistant to cefazolin, cefepime and Septra. -Previously given 2 days of IV Rocephin and since no signs of sepsis was given a one-time dose of fosfomycin over the weekend -Purewick dc'd due to contributing to recurrent UTIs  Right upper extremity thrombophlebitis - [name] -> age indeterminate SVT of right cephalic vein  Neck Contracture/torticollis -- Resolved  Recurrent right knee pain/known ligamentous injury and chronic osteoarthritis -s/p injection of Marcaine and Depomedrol 6/30 -Continue scheduled Celebrex, Tylenol and Ultram  ESBL positive Enterobacter UTI -Has completed 5 days of meropenem -Contact isolation during hospitalization -continue Oxybutynin suspected bladder spasms  Klebsiella UTI -Resistant to nitrofurantoin and ampicillin -To minimize risk of recurrent UTI have asked that pure wick only be used at bedtime on a 10 PM to 8 AM schedule  Recurrent diarrhea -Resolved  Bacterial Conjunctivitis /periorbital cellulitis -Resolved  Chronic pansinusitis/recurrent fevers 2/2 aspiration PNA;History of Serratia Pneumonia  Hospital Acquired Pneumonia:  -Completed several courses of antibiotics -Follow-up imaging including sinus CT consistent with chronic but stable and resolving sinusitis   Anemia -Stable  HLD -Continue  Lipitor      Data Reviewed: Basic Metabolic Panel: No results for input(s): NA, K, CL, CO2, GLUCOSE, BUN, CREATININE, CALCIUM, MG, PHOS in the last 168 hours.    Liver Function Tests: No results for input(s): AST, ALT, ALKPHOS, BILITOT, PROT, ALBUMIN in the last 168 hours.     CBC: No results for input(s): WBC, NEUTROABS, HGB, HCT, MCV, PLT in the last 168 hours.     CBG: Recent Labs  Lab 03/10/21 0341 03/10/21 0625 03/11/21 0352 03/11/21 0632 03/12/21 0611  GLUCAP 133* 137* 117* 160* 119*     Studies: No results found.   Scheduled Meds:  acetaminophen  650 mg Per Tube Q6H    amLODipine  10 mg Per Tube Daily   atorvastatin  40 mg Per Tube Daily   baclofen  5 mg Per Tube QHS   carvedilol  25 mg Per Tube BID WC   celecoxib  200 mg Per Tube BID   chlorhexidine gluconate (MEDLINE KIT)  15 mL Mouth Rinse BID   Chlorhexidine Gluconate Cloth  6 each Topical Daily   diclofenac Sodium  4 g Topical QID   doxazosin  2 mg Per Tube Daily   feeding supplement  237 mL Oral TID BM   feeding supplement (JEVITY 1.5 CAL/FIBER)  750 mL Per Tube Q24H   feeding supplement (PROSource TF)  45 mL Per Tube Daily   free water  200 mL Per Tube Q4H   hydrALAZINE  37.5 mg Per Tube Q8H   mouth rinse  15 mL Mouth Rinse 5 X Daily   modafinil  200 mg Per Tube Daily   multivitamin with minerals  1 tablet Per Tube Daily   pantoprazole sodium  40 mg Per Tube Daily   Continuous Infusions:  sodium chloride Stopped (10/26/20 1445)    Principal Problem:   ICH (intracerebral hemorrhage) ([place]) Active Problems:   Acute hypoxemic respiratory failure (HCC)   Tracheostomy dependent ([place])   Palliative care by specialist   Muscle hypertonicity   Oropharyngeal dysphagia   Poor prognosis   Aspiration pneumonia of right lower lobe (HCC)   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  Palliative care.   PCCM continues to follow.  No decannulation.   Procedures: Tracheostomy PEG tube EEG Echocardiogram  Antibiotics: Vancomycin 2/12-2/15 Maxipime 2/12-2/15 Ancef 2/19-2/27 Vancomycin 2/20 x 1 dose Maxipime 2/27- 3/5 Vancomycin 3/1-3/3 Ancef 3/6-3/14 Cipro 3/20 4-3/30 Unasyn 3/30-4/4 Rocephin 4/11-4/12 Meropenem 4/12-4/17   Time spent: 15 minutes    Erin Hearing ANP  [facility] 7 am - 330 pm/M-F for direct patient care and  secure chat Please refer to [place] for contact info 200  Days

## 2021-03-15 NOTE — Progress Notes (Signed)
Nutrition Follow-up  DOCUMENTATION CODES:  Not applicable  INTERVENTION:  -Continue to encourage PO intake & assist as needed -Continue Ensure Enlive po TID -Continue MVI with minerals daily  Continue nocturnal TF via PEG: -Jevity 1.5 @ 97ml/hr for 10 hours from 2000-0600 ( total) -54ml PROSource TF daily Provides 1165 kcals, 58 grams protein, free water -- meets ~73% minimum estimated calorie and protein needs -Free water flushes per MD, currently Q4H (provides total free water w/ TF and flushes)  NUTRITION DIAGNOSIS:  Inadequate oral intake related to inability to eat as evidenced by NPO status. 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  Subsequently on 2/10, pt developed obtundation and respiratory arrest leading to intubation. Pt is now s/p trach/PEG placement. Hospital course complicated by Serratia pneumonia, ESBL Enterobacter UTI and pre-orbital cellulitis.  2/10 s/p EVD placement and intubation for respiratory arrest 2/13 EVD removed 2/17 palliative care consult; family requesting tx to [facility] 2/24 s/p trach and PEG 3/1 tx to [facility] 3/11 trach changed to shiley #6 cuffless 4/08 pt w/ sepsis 2/2 UTI 4/18 28% ATC/5L 5/2 CIR reevaluated pt and determined she has not made enough improvement for admit; recommended SNF 5/8 trach changed to shiley #4 cuffless 5/23 CCM evaluated and determined pt stilll unable to decannulate due to variable mental status 6/3 trach exchanged, still shiley #4  cuffless 6/30 s/p R knee aspiration and injection 7/3 trach exchanged, still shiley #4 cuffless 7/8 diet advanced to dysphagia 1 with thin liquids 7/17 trach exchanged, still shiley #4 cuffless 7/21 trach replaced after pt self-decannulated  8/01 tolerating PMV, minimal secretions 8/02 PEG replaced after pt removed 8/14 trach exchanged, still shiley #4 cuffless 8/23 trach reinserted (still shiley #4 cuffless) after pt decannulated herself  Pt continues to have inadequate PO intake. 0-10% meal completion x4 recorded meals since last RD assessment. Noted pt does much better with meals when family is able to feed her. Recommend continue nocturnal TF given ongoing inadequate po intake.   Admission weight: 61.2 kg Current weight (last updated 8/26): 64.8 kg  No UOP documented x24 hours  Medications: Ensure Enlive/Plus TID, mvi with minerals, protonix Labs reviewed. CBGs [phone]  Current TF: Jevity 1.5 @ 93ml/hr administered over 10 hours (2000-0600) to provide TF per night, 67ml Prosource TF daily, free water Q4H  Diet Order:   Diet Order             DIET - DYS 1 Room service appropriate? No; Fluid consistency: Thin  Diet effective now                  EDUCATION NEEDS:  No education needs have been identified at this time  Skin:  Skin Assessment: Reviewed RN Assessment  Last BM:  8/25  Height:  Ht Readings from Last 1 Encounters:  10/18/20 5\' 5"  (1.651 m)   Weight:  Wt Readings from Last 1 Encounters:  03/12/21 64.8 kg   Ideal Body Weight:  56.8 kg  BMI:  Body mass index is 23.77 kg/m.  Estimated Nutritional Needs:  Kcal:  1600-1800 Protein:  80-90 grams Fluid:  >1.6L/d   Eugene Gavia, MS, RD, LDN (she/her/hers) RD pager number and weekend/on-call pager number located in Amion.

## 2021-03-16 LAB — GLUCOSE, CAPILLARY
Glucose-Capillary: 144 mg/dL — ABNORMAL HIGH (ref 70–99)
Glucose-Capillary: 131 mg/dL — ABNORMAL HIGH (ref 70–99)

## 2021-03-16 NOTE — TOC Progression Note (Addendum)
Transition of Care [facility]) - Progression Note    Patient Details  Name: Charlene [name] MRN: [id number] Date of Birth: 01-19-1966  Transition of Care [facility]) CM/SW Contact  Janae Bridgeman, RN Phone Number: 03/16/2021, 2:09 PM  Clinical Narrative:    CM spoke with Macario Golds, MSW Supervisor and she informed me that after the court hearing for guardianship today - the judge asked that the patient's husband contact [facility] in [place], [place] today between 2:30 and 4:30 and speak with the admissions liaison, Isabelle Course - [phone].  CM called and spoke with Alison Murray, husband on the phone and I directed him to call [facility] today between 2:30 and 4:30 for follow up regarding possible admission to the facility if approved by the DON after communication today.  CM and MSW with DTP Team will continue to follow the patient for SNF placement.   Expected Discharge Plan: Skilled Nursing Facility Barriers to Discharge: Continued Medical Work up  Expected Discharge Plan and Services Expected Discharge Plan: Skilled Nursing Facility In-house Referral: Clinical Social Work Discharge Planning Services: CM Consult Post Acute Care Choice: Skilled Nursing Facility Living arrangements for the past 2 months: Single Family Home                                       Social Determinants of Health (SDOH) Interventions    Readmission Risk Interventions Readmission Risk Prevention Plan 09/29/2020  Transportation Screening Complete  PCP or Specialist Appt within 3-5 Days Complete  HRI or Home Care Consult Complete  Social Work Consult for Recovery Care Planning/Counseling Complete  Palliative Care Screening Complete  Medication Review Oceanographer) Complete  Some recent data might be hidden

## 2021-03-16 NOTE — Progress Notes (Signed)
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  Subsequently, on 2/10, she developed obtundation and respiratory arrest leading to intubation.  She has remained persistently obtunded, now has a trach and a PEG.  Hospital course complicated by persistent fever, Serratia pneumonia and now pre-orbital cellulitis. As of 3/25 with adjustments in hypertonicity medications patient began to improve regarding hypertonicity.  Eventually started on provigil with improvements in alertness.  Evaluated by rehab physician who recommended increasing provigil dose further.  Subsequently patient has become more alert.  Most days she is able to track and with encouragement able to follow simple commands especially since partial obstruction glasses placed.    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   Tracheostomy dependent -Tracheostomy team following.  Continue trach care.  PCCM following as well.  Constipation On MiraLAX.   Hypertensive emergency/Malignant  HTN -Continue Norvasc, beta-blocker and hydralazine.   Dysphagia -Continue dysphagia 1 with thin liquids. Mostly on nocturnal tube feedings    History of urinary retention -Continue Cardura   Enterobacter cystitis Treated   Right upper extremity thrombophlebitis Stable   Neck Contracture/torticollis Improved   Recurrent right knee pain/known ligamentous injury and chronic osteoarthritis -s/p injection of Marcaine and Depomedrol 6/30 -Continue scheduled Celebrex, Tylenol and Ultram   ESBL positive Enterobacter UTI -Has completed 5 days of meropenem -Contact isolation during hospitalization -continue Oxybutynin suspected bladder spasms   Recurrent diarrhea -Resolved   Chronic pansinusitis/recurrent fevers 2/2 aspiration PNA;History of Serratia Pneumonia  Hospital Acquired Pneumonia:  -Completed several courses of antibiotics  Anemia -Stable   Hyperlipidemia -Continue Lipitor   DVT prophylaxis: SCD's Start: 08/27/20 0026  Code Status: Full code  Family Communication: None  Status is: Inpatient  Remains inpatient appropriate because:Unsafe d/c plan, IV treatments appropriate due to intensity of illness or inability to take PO, and Inpatient level of care appropriate due to severity of illness  Dispo: The patient is from: Home              Anticipated d/c is to: SNF              Patient currently is not medically stable to d/c.   Difficult to place patient Yes  Consultants: Neurology  Neurosurgery.  Palliative care.   PCCM   Procedures: Tracheostomy PEG tube EEG 2D echocardiogram  Anti-infectives: None currently  Subjective: No interval complaints reported.  Some cough reported.  Objective: Vitals:   03/16/21 0849 03/16/21 1137  BP:    Pulse: 91 84  Resp: (!) 21 18  Temp:    SpO2: 100% 100%    Intake/Output Summary (Last 24 hours) at 03/16/2021 1141 Last data filed  at 03/16/2021 0826 Gross per 24 hour  Intake 1210 ml  Output --  Net  1210 ml    Filed Weights   03/11/21 0500 03/12/21 0500 03/16/21 0500  Weight: 61.5 kg 64.8 kg 65.1 kg   Body mass index is 23.88 kg/m.   Physical Exam:  General:  Average built, not in obvious distress alert awake HENT:   Trach collar in place Chest:  Clear breath sounds.  Diminished breath sounds bilaterally. No crackles or wheezes.  CVS: S1 &S2 heard. No murmur.  Regular rate and rhythm. Abdomen: Soft, PEG tube in place. Extremities: No cyanosis, clubbing or edema.  Peripheral pulses are palpable.  On mittens. Psych: Alert, awake and oriented, normal mood CNS: Opens eyes, alert awake left-sided neglect. Skin: Warm and dry.  No rashes noted.   Data Review: I have personally reviewed the following laboratory data and studies,  CBC: No results for input(s): WBC, NEUTROABS, HGB, HCT, MCV, PLT in the last 168 hours. Basic Metabolic Panel: No results for input(s): NA, K, CL, CO2, GLUCOSE, BUN, CREATININE, CALCIUM, MG, PHOS in the last 168 hours. Liver Function Tests: No results for input(s): AST, ALT, ALKPHOS, BILITOT, PROT, ALBUMIN in the last 168 hours. No results for input(s): LIPASE, AMYLASE in the last 168 hours. No results for input(s): AMMONIA in the last 168 hours. Cardiac Enzymes: No results for input(s): CKTOTAL, CKMB, CKMBINDEX, TROPONINI in the last 168 hours. BNP (last 3 results) No results for input(s): BNP in the last 8760 hours.  ProBNP (last 3 results) No results for input(s): PROBNP in the last 8760 hours.  CBG: Recent Labs  Lab 03/14/21 1630 03/15/21 0150 03/15/21 0616 03/16/21 0136 03/16/21 0651  GLUCAP 92 123* 160* 144* 131*    No results found for this or any previous visit (from the past 240 hour(s)).   Studies: No results found.    Joycelyn Das, MD  [facility] 03/16/2021  If 7PM-7AM, please contact night-coverage

## 2021-03-17 LAB — GLUCOSE, CAPILLARY
Glucose-Capillary: 150 mg/dL — ABNORMAL HIGH (ref 70–99)
Glucose-Capillary: 164 mg/dL — ABNORMAL HIGH (ref 70–99)
Glucose-Capillary: 95 mg/dL (ref 70–99)

## 2021-03-17 MED ORDER — JEVITY 1.5 CAL/FIBER PO LIQD
750.0000 mL | ORAL | Status: DC
  Administered 2021-03-17 – 2021-04-15 (×30): 750 mL
  Filled 2021-03-17 (×35): qty 1000

## 2021-03-17 NOTE — Plan of Care (Signed)
  Problem: Intracerebral Hemorrhage Tissue Perfusion: Goal: Complications of Intracerebral Hemorrhage will be minimized Outcome: Progressing   Problem: Education: Goal: Knowledge of General Education information will improve Description: Including pain rating scale, medication(s)/side effects and non-pharmacologic comfort measures Outcome: Progressing   Problem: Health Behavior/Discharge Planning: Goal: Ability to manage health-related needs will improve Outcome: Progressing   Problem: Clinical Measurements: Goal: Ability to maintain clinical measurements within normal limits will improve Outcome: Progressing Goal: Will remain free from infection Outcome: Progressing Goal: Diagnostic test results will improve Outcome: Progressing Goal: Respiratory complications will improve Outcome: Progressing Goal: Cardiovascular complication will be avoided Outcome: Progressing   Problem: Coping: Goal: Level of anxiety will decrease Outcome: Progressing   Problem: Elimination: Goal: Will not experience complications related to bowel motility Outcome: Progressing Goal: Will not experience complications related to urinary retention Outcome: Progressing   Problem: Pain Managment: Goal: General experience of comfort will improve Outcome: Progressing   Problem: Skin Integrity: Goal: Risk for impaired skin integrity will decrease Outcome: Progressing   Problem: Intracerebral Hemorrhage Tissue Perfusion: Goal: Complications of Intracerebral Hemorrhage will be minimized Outcome: Progressing   Problem: Ischemic Stroke/TIA Tissue Perfusion: Goal: Complications of ischemic stroke/TIA will be minimized Outcome: Progressing

## 2021-03-17 NOTE — Progress Notes (Signed)
03/17/2021 Pt seemed tired this morning, possibly a bit more flat.  She is also not vocalizing or attempting to vocalize as much as she was 2-3 weeks ago.  Mostly silent.  She continues to progress slowly mostly with gains in sitting balance, one step command following, and trunk initiation during bed mobility and supine <->sit transfers. PT will continue to follow acutely.  Corinna Capra, PT, DPT  Acute Rehabilitation Ortho Tech Supervisor [phone] pager [phone] office       03/17/21 1000  PT Visit Information  Last PT Received On 03/17/21  Assistance Needed +2  History of Present Illness Pt is 55 y/o female [facility] with L sided weakness and headaches. 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 s/p Right frontal ventricular catheter placement on 2/10, stopped functioning on 2/11.  MRI 2/11 shows right greater than left mesial temporal lobes / parahippocampal cortex, bilateral basal ganglia, and possibly the upper pons that are concerning for acute infarcts, most likely secondary to mass effect from the hemorrhage. ETT 2/10-2/24.  Pt with trach and PEG on 09/10/20. Hospital course complicated by persistent fever, Serratia pneumonia and now pre-orbital cellulitis.   Pt self decannulated 02/04/21, trach re-inserted by RT. Pt dislodged PEG, guided replacement 8/2.  PMH includes anxiety, HTN.  Subjective Data  Patient Stated Goal unable to state  Precautions  Precautions Fall  Precaution Comments peg and trach, R mitt  Required Braces or Orthoses Other Brace  Other Brace bil. PRAFOs, soft R knee extension splint and soft L elbow extension splint  Pain Assessment  Pain Assessment Faces  Faces Pain Scale 4  Pain Location generalized with mobility  Pain Descriptors / Indicators Grimacing;Guarding  Pain Intervention(s) Limited  activity within patient's tolerance;Monitored during session;Repositioned  Cognition  Arousal/Alertness Lethargic (initially, PT woke pt from sleeping)  Behavior During Therapy Flat affect (more flat than usual, less attempts at vocalizations lately)  Overall Cognitive Status Impaired/Different from baseline  Area of Impairment Attention;Following commands;Awareness;Safety/judgement;Problem solving  Current Attention Level Sustained  Following Commands Follows one step commands inconsistently;Follows one step commands with increased time (attempted multi step command today, but had to break it into two parts, multiple attempts)  Safety/Judgement Decreased awareness of safety;Decreased awareness of deficits  Awareness Intellectual (still pushes to the left)  Problem Solving Slow processing;Decreased initiation;Difficulty sequencing;Requires verbal cues;Requires tactile cues  General Comments Pt continues to have decreased sustained attention, distracted today by things on her R, monitor wires, buttons on the bed rail. She will refocus, but takes multiple attempts.  I tried a two step command to pick up the pillow and put it on the bed.  She eventually picked up the pillow, but needed a separate additional command to put it on the bed before she would place it there.  Difficult to assess due to Tracheostomy;Impaired communication  Bed Mobility  Overal bed mobility Needs Assistance  Bed Mobility Rolling;Sidelying to Sit;Sit to Sidelying  Rolling Max assist  Sidelying to sit Max assist  Sit to sidelying Max assist  General bed mobility comments assist at trunk, easier to come out of R side of bed as pt is already preferenced to the right (knees rotated right and trunk rotated right, as well as head and neck).  When returnting to bed, once I got pt's trunk in sidelying she did attempt as best of her physical  abilities to pull her legs up, however, her tone may have also played a role in helping  with this transition.  Transfers  General transfer comment I asked vivan several times during my session if she wanted to get up to the [facility] and she never nodded yes (in the past she would give a slight, but discernable nod yes).  Modified Rankin (Stroke Patients Only)  Pre-Morbid Rankin Score 0  Modified Rankin 5  Balance  Overall balance assessment Needs assistance  Sitting-balance support Feet supported;Single extremity supported  Sitting balance-Leahy Scale Poor  Sitting balance - Comments Mostly mod assist, however, when positioned upright and placed R hand in her lap, she could have brief moments of supervision (,15 seconds) and does attempt trunkal flexion when allowed to tip backwards unsupported, just not strong enough to pull up out of the LOB.  Worked in sitting for >20 mins on sitting balance, command following balance reactions, stretching head, neck and trunk into rotation bil directions.  Postural control Posterior lean;Left lateral lean (left lateral push)  General Comments  General comments (skin integrity, edema, etc.) Pt left in high chair mode with pillows positioned for comfort/pressure relief.  Called RN staff via call bell to let them know that her breakfast tray was there and she needed help eating.  No family in room, so left feet up (vs traditional chair mode) for safety.  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 1  Help needed moving from lying on your back to sitting on the side of a flat bed without using bedrails? 1  Help needed moving to and from a bed to a chair (including a wheelchair)? 1  Help needed standing up from a chair using your arms (e.g., wheelchair or bedside chair)? 1  Help needed to walk in hospital room? 1  Help needed climbing 3-5 steps with a railing?  1  6 Click Score 6  Consider Recommendation of Discharge To: CIR/SNF/LTACH  Progressive Mobility  What is the highest level of mobility based on the progressive mobility assessment? Level 1 (Bedfast) - Unable to balance while sitting on edge of bed  Mobility Sit up in bed/chair position for meals  PT Goal Progression  Progress towards PT goals Progressing toward goals  PT Time Calculation  PT Start Time (ACUTE ONLY) 7026  PT Stop Time (ACUTE ONLY) 1014  PT Time Calculation (min) (ACUTE ONLY) 37 min  PT General Charges  $$ ACUTE PT VISIT 1 Visit  PT Treatments  $Therapeutic Activity 8-22 mins  $Neuromuscular Re-education 8-22 mins

## 2021-03-17 NOTE — TOC Progression Note (Signed)
Transition of Care [facility]) - Progression Note    Patient Details  Name: Charlene [name] MRN: [id number] Date of Birth: 1965/07/28  [facility] CM/SW Contact  Janae Bridgeman, RN Phone Number: 03/17/2021, 12:24 PM  Clinical Narrative:    Case management spoke with Isabelle Course, CM at [facility] in [place], [place] and she spoke with the patient's husband yesterday on the phone after the court session yesterday at 10 am.  Reyne Dumas, Admission DON for [facility] and she is going to explore possible [facility] facilities closer to [place] including [place], [address] and [place].  Clinicals were sent for review to be forwarded to the above facilities for review.  CM with DTP Team will continue to follow the patient for needed SNF placement.   Expected Discharge Plan: Skilled Nursing Facility Barriers to Discharge: Continued Medical Work up  Expected Discharge Plan and Services Expected Discharge Plan: Skilled Nursing Facility In-house Referral: Clinical Social Work Discharge Planning Services: CM Consult Post Acute Care Choice: Skilled Nursing Facility Living arrangements for the past 2 months: Single Family Home                                       Social Determinants of Health (SDOH) Interventions    Readmission Risk Interventions Readmission Risk Prevention Plan 09/29/2020  Transportation Screening Complete  PCP or Specialist Appt within 3-5 Days Complete  HRI or Home Care Consult Complete  Social Work Consult for Recovery Care Planning/Counseling Complete  Palliative Care Screening Complete  Medication Review Oceanographer) Complete  Some recent data might be hidden

## 2021-03-17 NOTE — Progress Notes (Signed)
[facility] PROGRESS NOTE  Charlene [name] ZDG:[id number] DOB: 1966/02/16 DOA: 08/26/2020 PCP: Patient, [name] (Inactive)    October 08, 2020     10/16/2020                        10/16/2020     10/18/2020                        10/20/2020      12/30/2020                   12/30/2020     01/04/2021                   02/24/2021 Viv really needs a teddy bear!   Status: Remains inpatient appropriate because:Altered mental status and Unsafe d/c plan: Requirement to discharge to trach capable SNF   Dispo: The patient is from: Home              Anticipated d/c is to: SNF trach capable-clinicals resubmitted to multiple facilities as of 8/31.  Unable to decannulate 2/2 and lack of appropriate voluntary and involuntary cough response              Patient currently is medically stable to d/c.   Difficult to place patient Yes   Level of care: Telemetry Medical  Code Status: Full Family Communication: [name] [phone]) is her POA and legal contact.  [name] by phone 8/19.  Mother at bedside 8/29-Dtr [name] 8/19 DVT prophylaxis: Chronically bedbound so Lovenox discontinued   Vaccination status: Unvaccinated-husband still undecided.  Micro Data:  2/9 COVID + Influenza negative 2/10 MRSA PCR negative 2/12 BCx 2 > no growth  2/19 BC 1/2 +Stap EPI, 1/2 no growth  10/07/2020 sputum positive for Serratia marcescens 4/8 Enterobacter cloaca + urine culture MDR sensitive only to imipenem and gentamicin   Antimicrobials:  Cefepime 2/12 > 2/15 Vanc 2/12 > 2/15 10/08/2020 ciprofloxacin>>3/30 Unasyn 3/30 >> 4/02 Rocephin 4/11 > 4/12 Meropenem 4/12>4/17 Meropenem 5/20 through 5/22 Ceftriaxone 5/22 through 5/26 Meropenem 6/3 Ceftriaxone 6/4 through 6/10 Septra 7/4 through 7/7 Ceftriaxone 7/29 was 1 dose followed by 1 dose of fosfomycin   HPI: 55 yo F with 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   Subsequently on 2/10, she developed obtundation and respiratory arrest leading to intubation.  She has remained persistently obtunded, now has a trach and a PEG.  Hospital course complicated by persistent fever, Serratia pneumonia and now pre-orbital cellulitis  As of 3/25 with adjustments in hypertonicity medications patient began to improve regarding hypertonicity.  Eventually started on provigil with improvements in alertness.  Evaluated by rehab physician who recommended increasing provigil dose further.  Subsequently patient has become more alert.  Most days she is able to track and with encouragement able to follow simple commands especially since partial obstruction glasses placed.  See progress note dictated 4/27 for expanded details.   Significant events: 2/09 Admit for ICH 2/10 EVD placed, later intubated due to obtundation and respiratory arrest 2/11 MRI Brain, hypertonic saline d/c'd, EVD not working.  Husband did not consent to PICC  2/13 EVD removed 2/17 [place] discussion with family, PMT 2/24 Bedside trach and PEG 3/1 Transferred to [facility]  Subjective: Awake and alert.  Tracking in room.  Not attempting to phonate.  Spoke slowly and clearly into patient's right ear and repeatedly asked her to  cough and demonstrated cough.  Patient unable to do so.  Objective: Vitals:   03/17/21 0309 03/17/21 0337  BP:  (!) 143/94  Pulse: 84 93  Resp: 18 20  Temp:  98.3 F (36.8 C)  SpO2: 100% 99%   Filed Weights   03/12/21 0500 03/16/21 0500 03/17/21 0500  Weight: 64.8 kg 65.1 kg 61.5 kg    Exam:       Gen: Awake and alert, Respiratory: #4.0 cuffless trach, trach collar with FiO2 21% at 5L; lungs CTA, no increased work of breathing. Cardiovascular: S1-S2, normotensive, no peripheral edema.  Warm to the touch Abdomen:  PEG /nocturnal tube feedings. LBM 8/27, nondistended, normoactive bowel sounds. Neurological: CN 2 -12 intact except for suspected hearing loss post stroke. Spontaneous  purposeful movement of right upper extremity/strength 4/5.   RLE strength 2/5 with purposeful movement.  Persistent left-side neglect.   Psychiatric: Awake.  Very interactive today.    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 On 8/16 Dr. Dewald/PCCM discussed with patient's husband [name] regarding risks and benefits for decannulation and both [name] and family agree -Not yet a candidate for decannulation given lack of significant improvement thus far in terms of consistent cough strength and ability for her to cognitively understand and act on instructions to cough plus signs of impulsiveness and ongoing aspiration -Patient having throat/neck pain after multiple self decannulation's in the past several weeks.  Continue scheduled Tylenol and as needed Ultram  Constipation -On as needed MiraLAX -occasionally requires a dose of milk of magnesia x1  Hypertensive emergency/Malignant HTN -Continue Norvasc, beta-blocker and hydralazine.  Dysphagia -Continue dysphagia 1 with thin liquids -Inconsistent oral intake therefore has been resumed on nocturnal tube feedings -Patient complaining of painful or at least uncomfortable swallowing in context of trach  History of urinary retention -Continue Cardura    Other problems: Enterobacter cystitis -Urine culture with Enterobacter resistant to cefazolin, cefepime and Septra. -Previously given 2 days of IV Rocephin and since no signs of sepsis was given a one-time dose of fosfomycin over the weekend -Purewick dc'd due to  contributing to recurrent UTIs  Right upper extremity thrombophlebitis - [name] -> age indeterminate SVT of right cephalic vein  Neck Contracture/torticollis -- Resolved  Recurrent right knee pain/known ligamentous injury and chronic osteoarthritis -s/p injection of Marcaine and Depomedrol 6/30 -Continue scheduled Celebrex, Tylenol and Ultram  ESBL positive Enterobacter UTI -Has completed 5 days of meropenem -Contact isolation during hospitalization -continue Oxybutynin suspected bladder spasms  Klebsiella UTI -Resistant to nitrofurantoin and ampicillin -To minimize risk of recurrent UTI have asked that pure wick only be used at bedtime on a 10 PM to 8 AM schedule  Recurrent diarrhea -Resolved  Bacterial Conjunctivitis /periorbital cellulitis -Resolved  Chronic pansinusitis/recurrent fevers 2/2 aspiration PNA;History of Serratia Pneumonia  Hospital Acquired Pneumonia:  -Completed several courses of antibiotics -Follow-up imaging including sinus CT consistent with chronic but stable and resolving sinusitis   Anemia -Stable  HLD -Continue Lipitor      Data Reviewed: Basic Metabolic Panel: No results for input(s): NA, K, CL, CO2, GLUCOSE, BUN, CREATININE, CALCIUM, MG, PHOS in the last 168 hours.    Liver Function Tests:  No results for input(s): AST, ALT, ALKPHOS, BILITOT, PROT, ALBUMIN in the last 168 hours.     CBC: No results for input(s): WBC, NEUTROABS, HGB, HCT, MCV, PLT in the last 168 hours.     CBG: Recent Labs  Lab 03/14/21 1630 03/15/21 0150 03/15/21 0616 03/16/21 0136 03/16/21 0651  GLUCAP 92 123* 160* 144* 131*     Studies: No results found.   Scheduled Meds:  acetaminophen  650 mg Per Tube Q6H   amLODipine  10 mg Per Tube Daily   atorvastatin  40 mg Per Tube Daily   baclofen  5 mg Per Tube QHS   carvedilol  25 mg Per Tube BID WC   celecoxib  200 mg Per Tube BID   chlorhexidine gluconate (MEDLINE KIT)  15 mL Mouth Rinse BID    Chlorhexidine Gluconate Cloth  6 each Topical Daily   diclofenac Sodium  4 g Topical QID   doxazosin  2 mg Per Tube Daily   feeding supplement  237 mL Oral TID BM   feeding supplement (JEVITY 1.5 CAL/FIBER)  750 mL Per Tube Q24H   feeding supplement (PROSource TF)  45 mL Per Tube Daily   free water  200 mL Per Tube Q4H   hydrALAZINE  37.5 mg Per Tube Q8H   mouth rinse  15 mL Mouth Rinse 5 X Daily   modafinil  200 mg Per Tube Daily   multivitamin with minerals  1 tablet Per Tube Daily   pantoprazole sodium  40 mg Per Tube Daily   Continuous Infusions:  sodium chloride Stopped (10/26/20 1445)    Principal Problem:   ICH (intracerebral hemorrhage) ([place]) Active Problems:   Acute hypoxemic respiratory failure (HCC)   Tracheostomy dependent ([place])   Palliative care by specialist   Muscle hypertonicity   Oropharyngeal dysphagia   Poor prognosis   Aspiration pneumonia of right lower lobe (HCC)   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  Palliative care.   PCCM continues to follow.  No decannulation.   Procedures: Tracheostomy PEG tube EEG Echocardiogram  Antibiotics: Vancomycin 2/12-2/15 Maxipime 2/12-2/15 Ancef 2/19-2/27 Vancomycin 2/20 x 1 dose Maxipime 2/27- 3/5 Vancomycin 3/1-3/3 Ancef 3/6-3/14 Cipro 3/20 4-3/30 Unasyn 3/30-4/4 Rocephin 4/11-4/12 Meropenem 4/12-4/17   Time spent: 15 minutes    Erin Hearing ANP  [facility] 7 am - 330 pm/M-F for direct patient care and secure chat Please refer to [place] for contact info 202  Days

## 2021-03-17 NOTE — Progress Notes (Signed)
Occupational Therapy Treatment Patient Details Name: Charlene [name] MRN: [id number] DOB: 20-Feb-1966 Today's Date: 03/17/2021    History of present illness Pt is 55 y/o female presents to [facility] on 08/26/20 with L sided weakness and headaches. 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 s/p Right frontal ventricular catheter placement on 2/10, stopped functioning on 2/11.  MRI 2/11 shows right greater than left mesial temporal lobes / parahippocampal cortex, bilateral basal ganglia, and possibly the upper pons that are concerning for acute infarcts, most likely secondary to mass effect from the hemorrhage. ETT 2/10-2/24.  Pt with trach and PEG on 09/10/20. Hospital course complicated by persistent fever, Serratia pneumonia and now pre-orbital cellulitis.   Pt self decannulated 02/04/21, trach re-inserted by RT. Pt dislodged PEG, guided replacement 8/2.  PMH includes anxiety, HTN.   OT comments  Patient supine in bed, flexed position.  Assisted with bed pad change with total assist due to soiled with urine; max assist to roll towards L side (but able to assist by reaching towards rail with R UE after guiding support and cueing) and mod assist to roll towards R side.  Max assist to side EOB, progressing towards mod assist but heavy posterior and L lateral lean (improved with R UE disengagement as tends to push).  She follows more commands consistency when supine vs upright at EOB.  Donning gown with max assist. Updated goals, met 1/5 and plan to decrease frequency to 1x/week. Will follow. SNF appropriate.    Follow Up Recommendations  SNF;Supervision/Assistance - 24 hour    Equipment Recommendations  None recommended by OT    Recommendations for Other Services      Precautions / Restrictions Precautions Precautions: Fall Precaution Comments: peg and trach, R  mitt Required Braces or Orthoses: Other Brace Other Brace: bil. PRAFOs, soft R knee extension splint and soft L elbow extension splint       Mobility Bed Mobility Overal bed mobility: Needs Assistance Bed Mobility: Rolling;Sidelying to Sit;Sit to Sidelying Rolling: Max assist Sidelying to sit: Max assist     Sit to sidelying: Max assist General bed mobility comments: max assist to roll towards L side, Mod towards R side; max assist to come up to sitting EOB    Transfers                 General transfer comment: deferred    Balance Overall balance assessment: Needs assistance Sitting-balance support: Feet unsupported Sitting balance-Leahy Scale: Poor Sitting balance - Comments: max assist initally with heavy posterior lean and pushing with R hand at times, progressing to mod assist at times. Decreased assist when R hand disengaged. Postural control: Posterior lean;Left lateral lean                                 ADL either performed or assessed with clinical judgement   ADL Overall ADL's : Needs assistance/impaired                 Upper Body Dressing : Maximal assistance;Bed level Upper Body Dressing Details (indicate cue type and reason): pt able to thread R UE into/out of gown with increased time, but requires assist for remainer of dressing tasks         Toileting- Clothing Manipulation and Hygiene: Total assistance;+2 for physical assistance;+2 for safety/equipment;Bed  level Toileting - Clothing Manipulation Details (indicate cue type and reason): incontinent of urine, total assist to roll and change pad     Functional mobility during ADLs: Maximal assistance;Cueing for safety;Cueing for sequencing       Vision       Perception     Praxis      Cognition Arousal/Alertness: Awake/alert Behavior During Therapy: Flat affect Overall Cognitive Status: Impaired/Different from baseline Area of Impairment: Attention;Following  commands;Awareness                   Current Attention Level: Sustained   Following Commands: Follows one step commands inconsistently;Follows one step commands with increased time   Awareness: Intellectual   General Comments: pt continues to require increased time to follow simple 1 step commands, more consistent from bed level compared to EOB sitting        Exercises Exercises: General Upper Extremity General Exercises - Upper Extremity Shoulder Flexion: PROM;5 reps;Seated;Left Shoulder ABduction: PROM;Left;5 reps Elbow Flexion: PROM;10 reps;Left Elbow Extension: PROM;Left;10 reps;Supine   Shoulder Instructions       General Comments      Pertinent Vitals/ Pain       Pain Assessment: Faces Faces Pain Scale: Hurts little more Pain Location: generalized with mobility Pain Descriptors / Indicators: Grimacing;Guarding Pain Intervention(s): Limited activity within patient's tolerance;Monitored during session;Repositioned  Home Living                                          Prior Functioning/Environment              Frequency  Min 1X/week        Progress Toward Goals  OT Goals(current goals can now be found in the care plan section)  Progress towards OT goals: Progressing toward goals (slowly)  Acute Rehab OT Goals Patient Stated Goal: unable to state OT Goal Formulation: Patient unable to participate in goal setting Time For Goal Achievement: 03/31/21 Potential to Achieve Goals: [place] Discharge plan remains appropriate;Frequency needs to be updated    Co-evaluation                 AM-PAC OT "6 Clicks" Daily Activity     Outcome Measure   Help from another person eating meals?: A Lot Help from another person taking care of personal grooming?: A Lot Help from another person toileting, which includes using toliet, bedpan, or urinal?: Total Help from another person bathing (including washing, rinsing, drying)?:  Total Help from another person to put on and taking off regular upper body clothing?: A Lot Help from another person to put on and taking off regular lower body clothing?: Total 6 Click Score: 9    End of Session Equipment Utilized During Treatment: Oxygen  OT Visit Diagnosis: Muscle weakness (generalized) (M62.81);Apraxia (R48.2);Cognitive communication deficit (R41.841);Hemiplegia and hemiparesis Symptoms and signs involving cognitive functions: Other Nontraumatic ICH Hemiplegia - Right/Left: Right Hemiplegia - caused by: Nontraumatic intracerebral hemorrhage   Activity Tolerance Patient tolerated treatment well   Patient Left in bed;with call bell/phone within reach   Nurse Communication Mobility status        Time: 4235-3614 OT Time Calculation (min): 29 min  Charges: OT General Charges $OT Visit: 1 Visit OT Treatments $Self Care/Home Management : 23-37 mins  Carmel Pager [phone] Office [place] 03/17/2021, 10:05  AM

## 2021-03-17 NOTE — Plan of Care (Signed)
Pt alert, Q2 turn. Incontinent of bowel and bladder. Pt had bowel movement. Feedings completed per order. Pt resting. No signs or symptoms of pain noted.    Problem: Coping: Goal: Will identify appropriate support needs Outcome: Progressing   Problem: Health Behavior/Discharge Planning: Goal: Ability to manage health-related needs will improve Outcome: Progressing   Problem: Self-Care: Goal: Verbalization of feelings and concerns over difficulty with self-care will improve Outcome: Progressing Goal: Ability to communicate needs accurately will improve Outcome: Progressing   Problem: Nutrition: Goal: Risk of aspiration will decrease Outcome: Progressing   Problem: Intracerebral Hemorrhage Tissue Perfusion: Goal: Complications of Intracerebral Hemorrhage will be minimized Outcome: Progressing   Problem: Intracerebral Hemorrhage Tissue Perfusion: Goal: Complications of Intracerebral Hemorrhage will be minimized Outcome: Progressing   Problem: Ischemic Stroke/TIA Tissue Perfusion: Goal: Complications of ischemic stroke/TIA will be minimized Outcome: Progressing   Problem: Education: Goal: Knowledge of General Education information will improve Description: Including pain rating scale, medication(s)/side effects and non-pharmacologic comfort measures Outcome: Progressing   Problem: Health Behavior/Discharge Planning: Goal: Ability to manage health-related needs will improve Outcome: Progressing   Problem: Clinical Measurements: Goal: Ability to maintain clinical measurements within normal limits will improve Outcome: Progressing Goal: Will remain free from infection Outcome: Progressing Goal: Diagnostic test results will improve Outcome: Progressing Goal: Respiratory complications will improve Outcome: Progressing Goal: Cardiovascular complication will be avoided Outcome: Progressing   Problem: Elimination: Goal: Will not experience complications related to bowel  motility Outcome: Progressing Goal: Will not experience complications related to urinary retention Outcome: Progressing   Problem: Coping: Goal: Level of anxiety will decrease Outcome: Progressing   Problem: Pain Managment: Goal: General experience of comfort will improve Outcome: Progressing   Problem: Skin Integrity: Goal: Risk for impaired skin integrity will decrease Outcome: Progressing

## 2021-03-18 LAB — CBC WITH DIFFERENTIAL/PLATELET
Abs Immature Granulocytes: 0.01 10*3/uL (ref 0.00–0.07)
Basophils Absolute: 0 10*3/uL (ref 0.0–0.1)
Basophils Relative: 1 %
Eosinophils Absolute: 0.1 10*3/uL (ref 0.0–0.5)
Eosinophils Relative: 2 %
HCT: 34 % — ABNORMAL LOW (ref 36.0–46.0)
Hemoglobin: 11 g/dL — ABNORMAL LOW (ref 12.0–15.0)
Immature Granulocytes: 0 %
Lymphocytes Relative: 35 %
Lymphs Abs: 1.8 10*3/uL (ref 0.7–4.0)
MCH: 25.1 pg — ABNORMAL LOW (ref 26.0–34.0)
MCHC: 32.4 g/dL (ref 30.0–36.0)
MCV: 77.6 fL — ABNORMAL LOW (ref 80.0–100.0)
Monocytes Absolute: 0.4 10*3/uL (ref 0.1–1.0)
Monocytes Relative: 7 %
Neutro Abs: 2.8 10*3/uL (ref 1.7–7.7)
Neutrophils Relative %: 55 %
Platelets: 238 10*3/uL (ref 150–400)
RBC: 4.38 MIL/uL (ref 3.87–5.11)
RDW: 15 % (ref 11.5–15.5)
WBC: 5 10*3/uL (ref 4.0–10.5)
nRBC: 0 % (ref 0.0–0.2)

## 2021-03-18 LAB — COMPREHENSIVE METABOLIC PANEL
ALT: 50 U/L — ABNORMAL HIGH (ref 0–44)
AST: 28 U/L (ref 15–41)
Albumin: 3.4 g/dL — ABNORMAL LOW (ref 3.5–5.0)
Alkaline Phosphatase: 80 U/L (ref 38–126)
Anion gap: 11 (ref 5–15)
BUN: 18 mg/dL (ref 6–20)
CO2: 27 mmol/L (ref 22–32)
Calcium: 9.3 mg/dL (ref 8.9–10.3)
Chloride: 101 mmol/L (ref 98–111)
Creatinine, Ser: 0.51 mg/dL (ref 0.44–1.00)
GFR, Estimated: >60 mL/min (ref >60)
Glucose, Bld: 131 mg/dL — ABNORMAL HIGH (ref 70–99)
Potassium: 3.5 mmol/L (ref 3.5–5.1)
Sodium: 139 mmol/L (ref 135–145)
Total Bilirubin: 0.3 mg/dL (ref 0.3–1.2)
Total Protein: 6.8 g/dL (ref 6.5–8.1)

## 2021-03-18 LAB — GLUCOSE, CAPILLARY
Glucose-Capillary: 132 mg/dL — ABNORMAL HIGH (ref 70–99)
Glucose-Capillary: 147 mg/dL — ABNORMAL HIGH (ref 70–99)

## 2021-03-18 NOTE — Plan of Care (Signed)
Pt is alert pt has trach. Attempted to feed pt her dinner, pt noted spitting out food multiple times. Pt offer liquids as well but pt spit out. Oral care completed and suctioned. Pt has been turned and dried. Pt tube feedings continued per order. Pt resting with eyes closed.    Problem: Coping: Goal: Will identify appropriate support needs Outcome: Progressing   Problem: Health Behavior/Discharge Planning: Goal: Ability to manage health-related needs will improve Outcome: Progressing   Problem: Self-Care: Goal: Verbalization of feelings and concerns over difficulty with self-care will improve Outcome: Progressing Goal: Ability to communicate needs accurately will improve Outcome: Progressing   Problem: Nutrition: Goal: Risk of aspiration will decrease Outcome: Progressing   Problem: Intracerebral Hemorrhage Tissue Perfusion: Goal: Complications of Intracerebral Hemorrhage will be minimized Outcome: Progressing   Problem: Intracerebral Hemorrhage Tissue Perfusion: Goal: Complications of Intracerebral Hemorrhage will be minimized Outcome: Progressing   Problem: Ischemic Stroke/TIA Tissue Perfusion: Goal: Complications of ischemic stroke/TIA will be minimized Outcome: Progressing   Problem: Education: Goal: Knowledge of General Education information will improve Description: Including pain rating scale, medication(s)/side effects and non-pharmacologic comfort measures Outcome: Progressing   Problem: Health Behavior/Discharge Planning: Goal: Ability to manage health-related needs will improve Outcome: Progressing   Problem: Clinical Measurements: Goal: Ability to maintain clinical measurements within normal limits will improve Outcome: Progressing Goal: Will remain free from infection Outcome: Progressing Goal: Diagnostic test results will improve Outcome: Progressing Goal: Respiratory complications will improve Outcome: Progressing Goal: Cardiovascular complication  will be avoided Outcome: Progressing   Problem: Coping: Goal: Level of anxiety will decrease Outcome: Progressing   Problem: Elimination: Goal: Will not experience complications related to bowel motility Outcome: Progressing Goal: Will not experience complications related to urinary retention Outcome: Progressing   Problem: Pain Managment: Goal: General experience of comfort will improve Outcome: Progressing   Problem: Skin Integrity: Goal: Risk for impaired skin integrity will decrease Outcome: Progressing

## 2021-03-18 NOTE — Progress Notes (Signed)
0200 blood glucose132

## 2021-03-18 NOTE — Progress Notes (Addendum)
[facility] PROGRESS NOTE  Charlene [name] IRJ:[id number] DOB: 1965/09/14 DOA: 08/26/2020 PCP: Patient, [name] (Inactive)    October 08, 2020     10/16/2020                        10/16/2020     10/18/2020                        10/20/2020      12/30/2020                   12/30/2020     01/04/2021                   02/24/2021 Viv really needs a teddy bear!   Status: Remains inpatient appropriate because:Altered mental status and Unsafe d/c plan: Requirement to discharge to trach capable SNF   Dispo: The patient is from: Home              Anticipated d/c is to: SNF trach capable-clinicals resubmitted to multiple facilities as of 8/31.  Unable to decannulate 2/2 and lack of appropriate voluntary and involuntary cough response              Patient currently is medically stable to d/c.   Difficult to place patient Yes         Barriers to DC: Continued requirement for right hand mitt to prevent self decannulation.  This is considered a restraint by SNFs. patient is not doing this by accident she is purposefully D cannulating herself.   Level of care: Telemetry Medical  Code Status: Full Family Communication: [name] [phone]) is her POA and legal contact.  [name] by phone 8/19.  Mother at bedside 8/29-Dtr [name] 8/19 DVT prophylaxis: Chronically bedbound so Lovenox discontinued   Vaccination status: Unvaccinated-husband still undecided.  Micro Data:  2/9 COVID + Influenza negative 2/10 MRSA PCR negative 2/12 BCx 2 > no growth  2/19 BC 1/2 +Stap EPI, 1/2 no growth  10/07/2020 sputum positive for Serratia marcescens 4/8 Enterobacter cloaca + urine culture MDR sensitive only to imipenem and gentamicin   Antimicrobials:  Cefepime 2/12 > 2/15 Vanc 2/12 > 2/15 10/08/2020 ciprofloxacin>>3/30 Unasyn 3/30 >> 4/02 Rocephin 4/11 > 4/12 Meropenem 4/12>4/17 Meropenem 5/20 through 5/22 Ceftriaxone 5/22 through 5/26 Meropenem 6/3 Ceftriaxone 6/4 through 6/10 Septra 7/4  through 7/7 Ceftriaxone 7/29 was 1 dose followed by 1 dose of fosfomycin   HPI: 55 yo F with 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  Subsequently on 2/10, she developed obtundation and respiratory arrest leading to intubation.  She has remained persistently obtunded, now has a trach and a PEG.  Hospital course complicated by persistent fever, Serratia pneumonia and now pre-orbital cellulitis  As of 3/25 with adjustments in hypertonicity medications patient began to improve regarding hypertonicity.  Eventually started on provigil with improvements in alertness.  Evaluated by rehab physician who recommended increasing provigil dose further.  Subsequently patient has become more alert.  Most days she is able to track and with encouragement able to follow simple commands especially since partial obstruction glasses placed.  See progress note dictated 4/27 for expanded details.   Significant events: 2/09 Admit for ICH 2/10 EVD placed, later intubated due to obtundation and respiratory arrest 2/11 MRI Brain, hypertonic saline d/c'd, EVD not working.  Husband did not consent to PICC  2/13 EVD removed 2/17  [place] discussion with family, PMT 2/24 Bedside trach and PEG 3/1 Transferred to [facility]  Subjective: Sleeping soundly and did not awaken during exam.  Objective: Vitals:   03/18/21 0330 03/18/21 0335  BP:  (!) 142/86  Pulse: 91 91  Resp:    Temp:  98.6 F (37 C)  SpO2: 100% 100%   Filed Weights   03/12/21 0500 03/16/21 0500 03/17/21 0500  Weight: 64.8 kg 65.1 kg 61.5 kg    Exam:       Gen: Sleeping soundly Respiratory: #4.0 cuffless trach, trach collar with FiO2 21% at 5L; sounds remain clear to auscultation.  No increased work of breathing.  No excessive tracheal secretion Cardiovascular: S1-S2, normotensive, no peripheral edema.  Warm to the touch Abdomen:  PEG /nocturnal tube feedings. LBM 8/30,  nondistended, normoactive bowel sounds. Neurological: Sleeping soundly but at baseline CN 2 -12 intact except for suspected hearing loss post stroke. Spontaneous purposeful movement of right upper extremity/strength 4/5.   RLE strength 2/5 with purposeful movement.  Persistent left-side neglect.   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  Tracheostomy dependent -PCCM/trach team following closely regarding candidacy for decannulation. On 8/16 Dr. Dewald/PCCM discussed with patient's husband [name] regarding risks and benefits for decannulation and both [name] and family agree -Not yet a candidate for decannulation given lack of significant improvement thus far in terms of consistent cough strength and ability for her to cognitively understand and act on instructions to cough plus signs of impulsiveness and ongoing aspiration -Patient having throat/neck pain after multiple self decannulation's in the past several weeks.  Continue scheduled Tylenol and as needed Ultram  Constipation -Continue prn MiraLAX   Hypertensive emergency/Malignant HTN -Continue Norvasc, beta-blocker and hydralazine.  Dysphagia -Continue dysphagia 1 with thin liquids -Inconsistent oral intake therefore -continue nocturnal tube feedings -Patient complaining of painful or at least uncomfortable swallowing in context of trach  History of urinary retention -Continue Cardura    Other problems: Enterobacter cystitis -Urine culture with Enterobacter resistant to cefazolin, cefepime and Septra. -Previously given 2 days of IV Rocephin and since no signs of sepsis was given a  one-time dose of fosfomycin over the weekend -Purewick dc'd due to contributing to recurrent UTIs  Right upper extremity thrombophlebitis - [name] -> age indeterminate SVT of right cephalic vein  Neck Contracture/torticollis -- Resolved  Recurrent right knee pain/known ligamentous injury and chronic osteoarthritis -s/p injection of Marcaine and Depomedrol 6/30 -Continue scheduled Celebrex, Tylenol and Ultram  ESBL positive Enterobacter UTI -Has completed 5 days of meropenem -Contact isolation during hospitalization -continue Oxybutynin suspected bladder spasms  Klebsiella UTI -Resistant to nitrofurantoin and ampicillin -To minimize risk of recurrent UTI have asked that pure wick only be used at bedtime on a 10 PM to 8 AM schedule  Recurrent diarrhea -Resolved  Bacterial Conjunctivitis /periorbital cellulitis -Resolved  Chronic pansinusitis/recurrent fevers 2/2 aspiration PNA;History of Serratia Pneumonia  Hospital Acquired Pneumonia:  -Completed several courses of antibiotics -Follow-up imaging including sinus CT consistent with chronic but stable and resolving sinusitis   Anemia -Stable  HLD -Continue Lipitor      Data Reviewed: Basic Metabolic Panel: Recent Labs  Lab 03/18/21 0240  NA 139  K 3.5  CL 101  CO2 27  GLUCOSE 131*  BUN 18  CREATININE 0.51  CALCIUM 9.3      Liver Function Tests: Recent Labs  Lab 03/18/21 0240  AST 28  ALT 50*  ALKPHOS 80  BILITOT 0.3  PROT 6.8  ALBUMIN 3.4*       CBC: Recent Labs  Lab 03/18/21 0240  WBC 5.0  NEUTROABS 2.8  HGB 11.0*  HCT 34.0*  MCV 77.6*  PLT 238       CBG: Recent Labs  Lab 03/16/21 0136 03/16/21 0651 03/17/21 0333 03/17/21 0602 03/17/21 1901  GLUCAP 144* 131* 150* 164* 95     Studies: No results found.   Scheduled Meds:  acetaminophen  650 mg Per Tube Q6H   amLODipine  10 mg Per Tube Daily   atorvastatin  40 mg Per Tube Daily   baclofen  5 mg Per Tube QHS    carvedilol  25 mg Per Tube BID WC   celecoxib  200 mg Per Tube BID   chlorhexidine gluconate (MEDLINE KIT)  15 mL Mouth Rinse BID   Chlorhexidine Gluconate Cloth  6 each Topical Daily   diclofenac Sodium  4 g Topical QID   doxazosin  2 mg Per Tube Daily   feeding supplement  237 mL Oral TID BM   feeding supplement (JEVITY 1.5 CAL/FIBER)  750 mL Per Tube Q24H   feeding supplement (PROSource TF)  45 mL Per Tube Daily   free water  200 mL Per Tube Q4H   hydrALAZINE  37.5 mg Per Tube Q8H   mouth rinse  15 mL Mouth Rinse 5 X Daily   modafinil  200 mg Per Tube Daily   multivitamin with minerals  1 tablet Per Tube Daily   pantoprazole sodium  40 mg Per Tube Daily   Continuous Infusions:  sodium chloride Stopped (10/26/20 1445)    Principal Problem:   ICH (intracerebral hemorrhage) ([place]) Active Problems:   Acute hypoxemic respiratory failure (HCC)   Tracheostomy dependent ([place])   Palliative care by specialist   Muscle hypertonicity   Oropharyngeal dysphagia   Poor prognosis   Aspiration pneumonia of right lower lobe (HCC)   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  Palliative care.   PCCM continues to follow.  No decannulation.   Procedures: Tracheostomy PEG tube EEG Echocardiogram  Antibiotics: Vancomycin 2/12-2/15 Maxipime 2/12-2/15 Ancef 2/19-2/27 Vancomycin 2/20 x 1 dose Maxipime 2/27- 3/5 Vancomycin 3/1-3/3 Ancef 3/6-3/14 Cipro 3/20 4-3/30 Unasyn 3/30-4/4 Rocephin 4/11-4/12 Meropenem 4/12-4/17   Time spent: 15 minutes    Erin Hearing ANP  [facility] 7 am - 330 pm/M-F for direct patient care and secure chat Please refer to [place] for contact info [phone]  Days

## 2021-03-18 NOTE — Progress Notes (Signed)
  Speech Language Pathology Treatment: Cognitive-Linquistic;Dysphagia  Patient Details Name: Charlene [name] MRN: [id number] DOB: 1966-05-18 Today's Date: 03/18/2021 Time: 9937-1696 SLP Time Calculation (min) (ACUTE ONLY): 27 min  Assessment / Plan / Recommendation Clinical Impression  Pt alert initially, then became very sleepy during session.  Mom at bedside.  Repositioned in upright position to optimize participation. PMV in place.  Offered sips of juice, which pt self-fed with occasional physical assist for stability. She demonstrated some anterior bolus loss but otherwise functional swallowing with no s/s of aspiration.  Limited attempts to verbalize. She appropriately waved hello several times to different staff in room. Utilized marker and paper - [name] initiated writing "Charlene [name]" on the page with no cues. [name] reported this is her maiden name.  Efforts to continue writing were met with perseverations on "H" despite max cues to shift set. Session ended due to pt lethargy.  SLP will continue to follow.   HPI HPI: Pt is 55 y/o female [facility] with L sided weakness and headaches. PMH includes anxiety, HTN. 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 S/p Right frontal ventricular catheter placement on 2/10, stopped functioning on 2/11.  MRI 2/11 shows right greater than left mesial temporal lobes / parahippocampal cortex, bilateral basal ganglia, and possibly the upper pons that are concerning for acute infarcts, most likely secondary to mass effect from the hemorrhage. ETT 2/10; trach/PEG 09/10/20.  Trach changed to #6 cuffless 3/11; #4 cuffless 5/4. Has had two MBS studies, the last on 7/8 - pt started on dysphagia 1, thin liquids. Trach replaced following self-decannulation 7/21, again on 8/23.      SLP Plan  Continue with current plan of  care       Recommendations  Diet recommendations: Dysphagia 1 (puree);Thin liquid Liquids provided via: Straw Medication Administration: Via alternative means Supervision: Staff to assist with self feeding;Trained caregiver to feed patient;Full supervision/cueing for compensatory strategies Compensations: Minimize environmental distractions;Small sips/bites Postural Changes and/or Swallow Maneuvers: Seated upright 90 degrees      Patient may use Passy-Muir Speech Valve: During all waking hours (remove during sleep);During PO intake/meals PMSV Supervision: Intermittent         Oral Care Recommendations: Oral care BID Follow up Recommendations: Skilled Nursing facility SLP Visit Diagnosis: Dysphagia, oropharyngeal phase (R13.12) Plan: Continue with current plan of care       GO              Charlene [name] L. Tivis Ringer, [place] Office number [phone] Pager [phone]   Juan Quam Charlene [name] 03/18/2021, 3:32 PM

## 2021-03-18 NOTE — TOC Progression Note (Addendum)
Transition of Care [facility]) - Progression Note    Patient Details  Name: Charlene [name] MRN: [id number] Date of Birth: 1966-03-29  Transition of Care [facility] CM/SW Contact  Janae Bridgeman, RN Phone Number: 03/18/2021, 9:21 AM  Clinical Narrative:    Case management left a message with Reyne Dumas, Admissions CM with [facility] to explore options for SNF placement for patient.  CM and MSW with DTP will continue to follow the patient for SNF placement.  03/18/2021 1345 - CM called and spoke with [facility], CM at [facility] and the facility is willing to review the patient's clinicals for admission.  I also called the spoke with Reyne Dumas CM with [facility] and she states that [place], [place] facility does not have an available bed for admission at this time.  Emelia Loron, CM states that [place] facility does not have any beds at this time.  She asked to have the clinicals placed in the hub for [facility] and [facility] in [place].  Emelia Loron states that she is concerned that the patient has a right hand neuro mitt in place that is considered a restraint in nursing facilities.   Emelia Loron, CM with [facility] will explore SNF options to facilities in the area.  CM and MSW with DTP Team will continue to follow the patient for SNF placement.    Expected Discharge Plan: Skilled Nursing Facility Barriers to Discharge: Continued Medical Work up  Expected Discharge Plan and Services Expected Discharge Plan: Skilled Nursing Facility In-house Referral: Clinical Social Work Discharge Planning Services: CM Consult Post Acute Care [facility]: Skilled Nursing Facility Living arrangements for the past 2 months: Single Family Home                                       Social Determinants of Health (SDOH) Interventions    Readmission Risk Interventions Readmission Risk Prevention Plan 09/29/2020  Transportation Screening Complete  PCP or  Specialist Appt within 3-5 Days Complete  HRI or Home Care Consult Complete  Social Work Consult for Recovery Care Planning/Counseling Complete  Palliative Care Screening Complete  Medication Review Oceanographer) Complete  Some recent data might be hidden

## 2021-03-19 ENCOUNTER — Inpatient Hospital Stay (HOSPITAL_COMMUNITY): Payer: Medicaid Other

## 2021-03-19 LAB — RETICULOCYTES
Immature Retic Fract: 10.4 % (ref 2.3–15.9)
RBC.: 4.35 MIL/uL (ref 3.87–5.11)
Retic Count, Absolute: 61.8 10*3/uL (ref 19.0–186.0)
Retic Ct Pct: 1.4 % (ref 0.4–3.1)

## 2021-03-19 LAB — CBC WITH DIFFERENTIAL/PLATELET
Abs Immature Granulocytes: 0.01 10*3/uL (ref 0.00–0.07)
Basophils Absolute: 0 10*3/uL (ref 0.0–0.1)
Basophils Relative: 0 %
Eosinophils Absolute: 0.1 10*3/uL (ref 0.0–0.5)
Eosinophils Relative: 1 %
HCT: 33.6 % — ABNORMAL LOW (ref 36.0–46.0)
Hemoglobin: 10.7 g/dL — ABNORMAL LOW (ref 12.0–15.0)
Immature Granulocytes: 0 %
Lymphocytes Relative: 40 %
Lymphs Abs: 1.9 10*3/uL (ref 0.7–4.0)
MCH: 24.7 pg — ABNORMAL LOW (ref 26.0–34.0)
MCHC: 31.8 g/dL (ref 30.0–36.0)
MCV: 77.6 fL — ABNORMAL LOW (ref 80.0–100.0)
Monocytes Absolute: 0.4 10*3/uL (ref 0.1–1.0)
Monocytes Relative: 9 %
Neutro Abs: 2.5 10*3/uL (ref 1.7–7.7)
Neutrophils Relative %: 50 %
Platelets: 263 10*3/uL (ref 150–400)
RBC: 4.33 MIL/uL (ref 3.87–5.11)
RDW: 14.8 % (ref 11.5–15.5)
WBC: 4.9 10*3/uL (ref 4.0–10.5)
nRBC: 0 % (ref 0.0–0.2)

## 2021-03-19 LAB — C-REACTIVE PROTEIN: CRP: 0.6 mg/dL (ref <1.0)

## 2021-03-19 LAB — URINALYSIS, COMPLETE (UACMP) WITH MICROSCOPIC
Bilirubin Urine: NEGATIVE
Glucose, UA: NEGATIVE mg/dL
Ketones, ur: NEGATIVE mg/dL
Nitrite: NEGATIVE
Protein, ur: NEGATIVE mg/dL
Specific Gravity, Urine: 1.008 (ref 1.005–1.030)
WBC, UA: >50 WBC/hpf — ABNORMAL HIGH (ref 0–5)
pH: 7 (ref 5.0–8.0)

## 2021-03-19 LAB — VITAMIN B12: Vitamin B-12: 1243 pg/mL — ABNORMAL HIGH (ref 180–914)

## 2021-03-19 LAB — RESP PANEL BY RT-PCR (FLU A&B, COVID) ARPGX2
Influenza A by PCR: NEGATIVE
Influenza B by PCR: NEGATIVE
SARS Coronavirus 2 by RT PCR: NEGATIVE

## 2021-03-19 LAB — IRON AND TIBC
Iron: 63 ug/dL (ref 28–170)
Saturation Ratios: 19 % (ref 10.4–31.8)
TIBC: 326 ug/dL (ref 250–450)
UIBC: 263 ug/dL

## 2021-03-19 LAB — PROCALCITONIN: Procalcitonin: <0.1 ng/mL

## 2021-03-19 LAB — FOLATE: Folate: 18.6 ng/mL (ref >5.9)

## 2021-03-19 LAB — FERRITIN: Ferritin: 74 ng/mL (ref 11–307)

## 2021-03-19 LAB — GLUCOSE, CAPILLARY
Glucose-Capillary: 117 mg/dL — ABNORMAL HIGH (ref 70–99)
Glucose-Capillary: 125 mg/dL — ABNORMAL HIGH (ref 70–99)

## 2021-03-19 IMAGING — DX DG CHEST 1V PORT
1 series · 1 of 1 positions shown · non-contrast
Comparison: [DATE]

CLINICAL DATA: Fever

EXAM:
PORTABLE CHEST 1 VIEW

[chest]
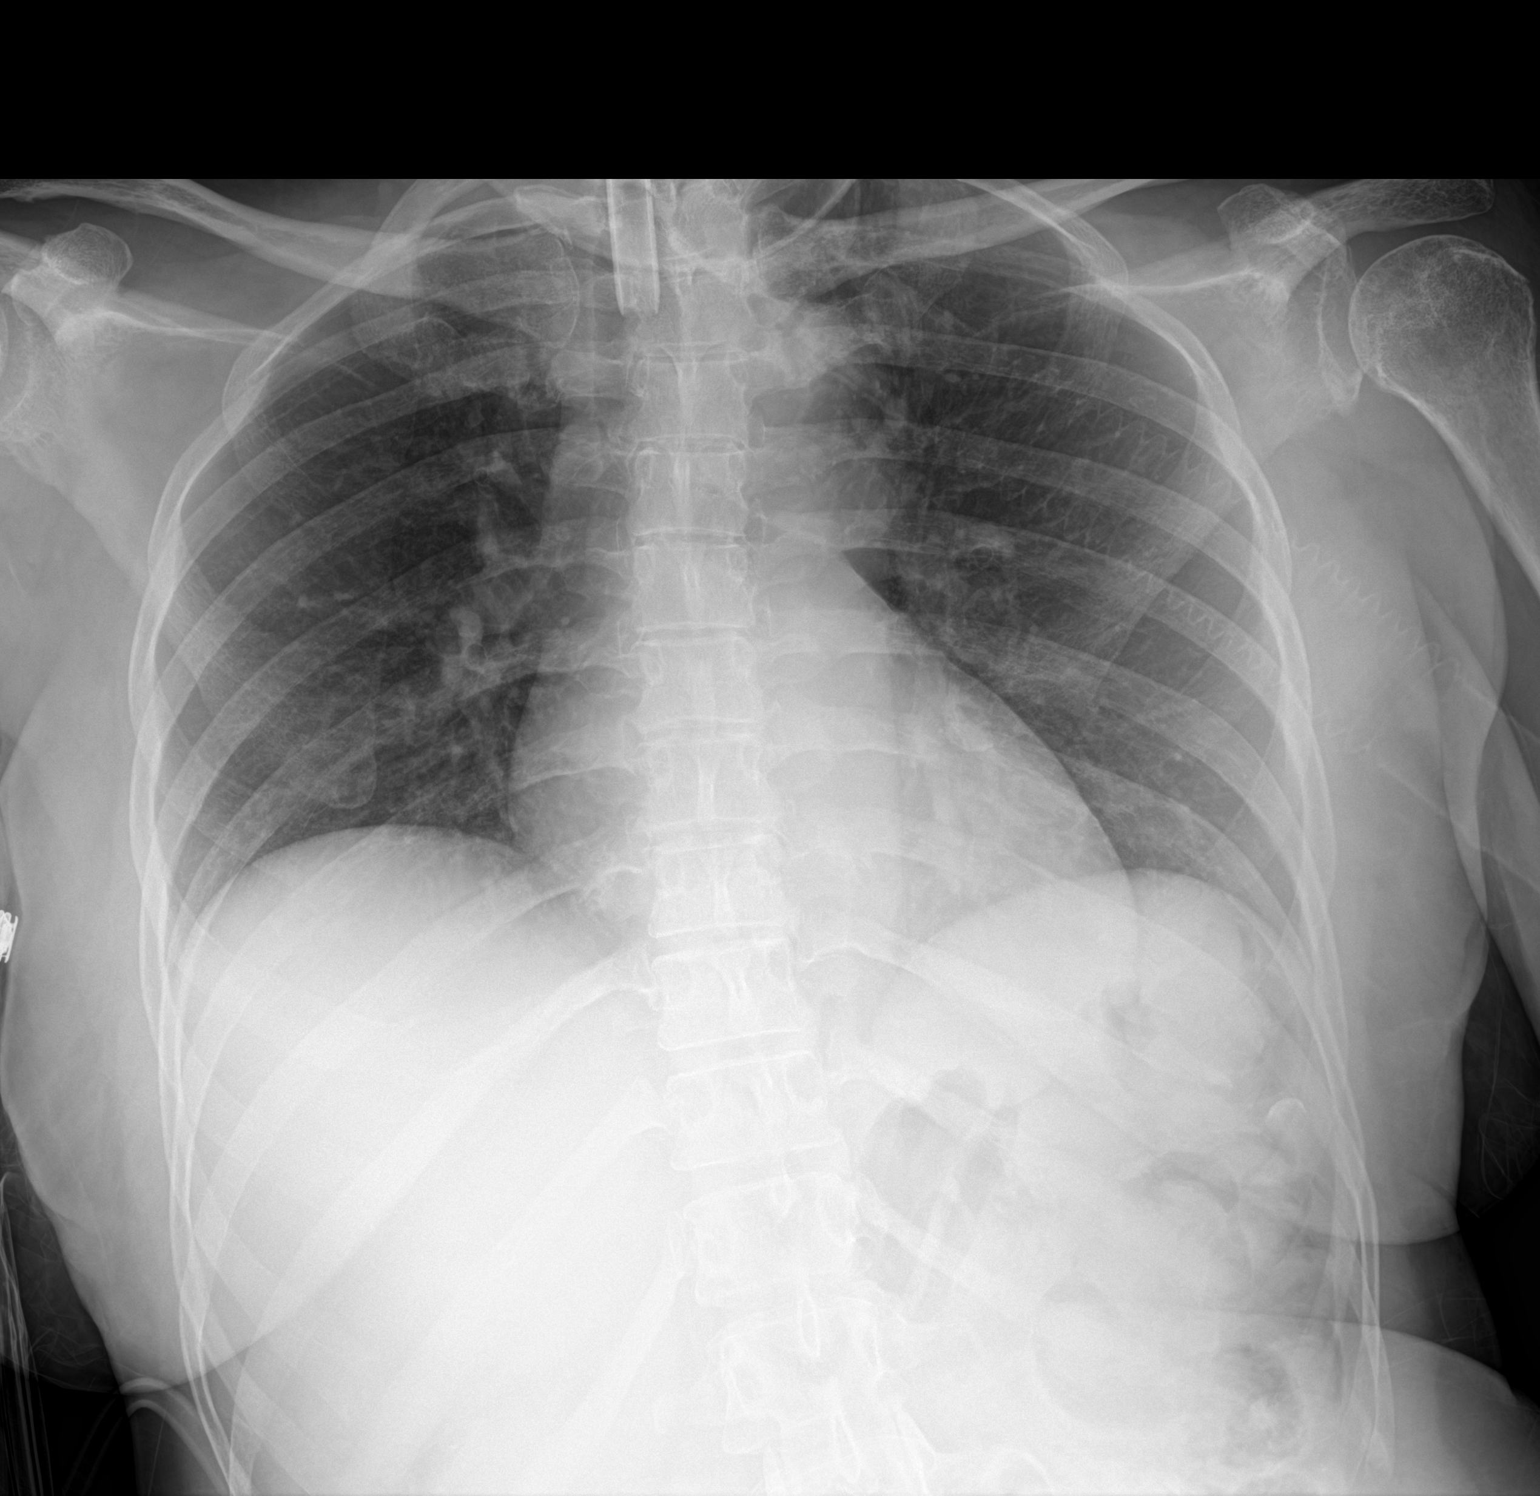

[1 of 1 positions shown; findings below may reference images not displayed]

FINDINGS: Tracheostomy remains in good position. Lungs are clear without
infiltrate or effusion. Normal pulmonary vascularity.
IMPRESSION: No active disease.

## 2021-03-19 MED ORDER — CIPROFLOXACIN HCL 500 MG PO TABS
500.0000 mg | ORAL_TABLET | Freq: Two times a day (BID) | ORAL | Status: AC
  Administered 2021-03-19 – 2021-03-25 (×14): 500 mg
  Filled 2021-03-19 (×14): qty 1

## 2021-03-19 MED ORDER — CIPROFLOXACIN 500 MG/5ML (10%) PO SUSR: 500.0000 mg | Freq: Two times a day (BID) | ORAL | Status: DC

## 2021-03-19 NOTE — TOC Progression Note (Addendum)
Transition of Care [facility]) - Progression Note    Patient Details  Name: Charlene [name] MRN: [id number] Date of Birth: [birth year]  Transition of Care [facility]) CM/SW Contact  Janae Bridgeman, RN Phone Number: 03/19/2021, 12:21 PM  Clinical Narrative:    Case management spoke with Reyne Dumas, CM at [facility] and she spoke with the patient's husband after the court date and states that she would like to reach out to closer facilities for admission versus placement at [facility] in [place].  Clinicals were placed in the hub for [facility] and [facility] in [place].  I spoke with the husband, [name] on the phone this morning and explained, again, that the patient would need COVID vaccine prior to admission to any of the [facility] facilities and he states that although he and the family have declined vaccination in the past that he will discuss with the family and call me back.  The patient will need COVID vaccination and removal of right hand neuro mitt to be able to admit to the skilled nursing facility.  [facility] in [place] SNF has not offered at bed at this time and the patient no longer has open bed offers.  Reyne Dumas, [facility] admissions coordinator continued to explore facilities for admission and [facility] is reviewing at this time.  Hospital leadership [place] and Macario Golds are both aware of the admission barriers at this time along with the family speaking with [facility] after the court date.  03/22/2021 - Late entry to 03/19/2021 0800 - I spoke with Alison Murray, husband on the phone and he states that he is in agreement for patient to receive the COVID vaccine.  Junious Silk, NP was notified and will order the vaccine once the new vaccine is available by the third week of September when it is available for administration.   Expected Discharge Plan: Skilled Nursing Facility Barriers to Discharge: Continued Medical Work  up  Expected Discharge Plan and Services Expected Discharge Plan: Skilled Nursing Facility In-house Referral: Clinical Social Work Discharge Planning Services: CM Consult Post Acute Care [facility]: Skilled Nursing Facility Living arrangements for the past 2 months: Single Family Home                                       Social Determinants of Health (SDOH) Interventions    Readmission Risk Interventions Readmission Risk Prevention Plan 09/29/2020  Transportation Screening Complete  PCP or Specialist Appt within 3-5 Days Complete  HRI or Home Care Consult Complete  Social Work Consult for Recovery Care Planning/Counseling Complete  Palliative Care Screening Complete  Medication Review Oceanographer) Complete  Some recent data might be hidden

## 2021-03-19 NOTE — Plan of Care (Signed)
  Problem: Coping: Goal: Will identify appropriate support needs 03/19/2021 1235 by Mamie Nick I, RN Outcome: Progressing 03/19/2021 1235 by Mamie Nick I, RN Outcome: Progressing   Problem: Health Behavior/Discharge Planning: Goal: Ability to manage health-related needs will improve 03/19/2021 1235 by Mamie Nick I, RN Outcome: Progressing 03/19/2021 1235 by Mamie Nick I, RN Outcome: Progressing   Problem: Self-Care: Goal: Verbalization of feelings and concerns over difficulty with self-care will improve 03/19/2021 1235 by Mamie Nick I, RN Outcome: Progressing 03/19/2021 1235 by Mamie Nick I, RN Outcome: Progressing Goal: Ability to communicate needs accurately will improve 03/19/2021 1235 by Mamie Nick I, RN Outcome: Progressing 03/19/2021 1235 by Mamie Nick I, RN Outcome: Progressing   Problem: Nutrition: Goal: Risk of aspiration will decrease 03/19/2021 1235 by Mamie Nick I, RN Outcome: Progressing 03/19/2021 1235 by Mamie Nick I, RN Outcome: Progressing   Problem: Intracerebral Hemorrhage Tissue Perfusion: Goal: Complications of Intracerebral Hemorrhage will be minimized 03/19/2021 1235 by Mamie Nick I, RN Outcome: Progressing 03/19/2021 1235 by Mamie Nick I, RN Outcome: Progressing   Problem: Education: Goal: Knowledge of General Education information will improve Description: Including pain rating scale, medication(s)/side effects and non-pharmacologic comfort measures 03/19/2021 1235 by Mamie Nick I, RN Outcome: Progressing 03/19/2021 1235 by Mamie Nick I, RN Outcome: Progressing   Problem: Health Behavior/Discharge Planning: Goal: Ability to manage health-related needs will improve 03/19/2021 1235 by Mamie Nick I, RN Outcome: Progressing 03/19/2021 1235 by Mamie Nick I, RN Outcome: Progressing   Problem: Clinical Measurements: Goal: Ability to maintain clinical measurements  within normal limits will improve 03/19/2021 1235 by Mamie Nick I, RN Outcome: Progressing 03/19/2021 1235 by Mamie Nick I, RN Outcome: Progressing Goal: Will remain free from infection 03/19/2021 1235 by Mamie Nick I, RN Outcome: Progressing 03/19/2021 1235 by Mamie Nick I, RN Outcome: Progressing Goal: Diagnostic test results will improve 03/19/2021 1235 by Mamie Nick I, RN Outcome: Progressing 03/19/2021 1235 by Mamie Nick I, RN Outcome: Progressing Goal: Respiratory complications will improve 03/19/2021 1235 by Mamie Nick I, RN Outcome: Progressing 03/19/2021 1235 by Mamie Nick I, RN Outcome: Progressing Goal: Cardiovascular complication will be avoided 03/19/2021 1235 by Mamie Nick I, RN Outcome: Progressing 03/19/2021 1235 by Mamie Nick I, RN Outcome: Progressing   Problem: Coping: Goal: Level of anxiety will decrease 03/19/2021 1235 by Mamie Nick I, RN Outcome: Progressing 03/19/2021 1235 by Mamie Nick I, RN Outcome: Progressing   Problem: Elimination: Goal: Will not experience complications related to bowel motility 03/19/2021 1235 by Mamie Nick I, RN Outcome: Progressing 03/19/2021 1235 by Mamie Nick I, RN Outcome: Progressing Goal: Will not experience complications related to urinary retention 03/19/2021 1235 by Mamie Nick I, RN Outcome: Progressing 03/19/2021 1235 by Mamie Nick I, RN Outcome: Progressing   Problem: Pain Managment: Goal: General experience of comfort will improve 03/19/2021 1235 by Mamie Nick I, RN Outcome: Progressing 03/19/2021 1235 by Mamie Nick I, RN Outcome: Progressing   Problem: Skin Integrity: Goal: Risk for impaired skin integrity will decrease 03/19/2021 1235 by Mamie Nick I, RN Outcome: Progressing 03/19/2021 1235 by Mamie Nick I, RN Outcome: Progressing   Problem: Intracerebral Hemorrhage Tissue Perfusion: Goal:  Complications of Intracerebral Hemorrhage will be minimized 03/19/2021 1235 by Mamie Nick I, RN Outcome: Progressing 03/19/2021 1235 by Mamie Nick I, RN Outcome: Progressing   Problem: Ischemic Stroke/TIA Tissue Perfusion: Goal: Complications of ischemic stroke/TIA will be minimized 03/19/2021 1235 by Mamie Nick I, RN Outcome: Progressing 03/19/2021 1235 by Mamie Nick I, RN Outcome: Progressing

## 2021-03-19 NOTE — Plan of Care (Signed)
  Problem: Coping: Goal: Will identify appropriate support needs Outcome: Not Progressing   Problem: Health Behavior/Discharge Planning: Goal: Ability to manage health-related needs will improve Outcome: Not Progressing   Problem: Self-Care: Goal: Verbalization of feelings and concerns over difficulty with self-care will improve Outcome: Not Progressing Goal: Ability to communicate needs accurately will improve Outcome: Progressing   Problem: Intracerebral Hemorrhage Tissue Perfusion: Goal: Complications of Intracerebral Hemorrhage will be minimized Outcome: Progressing

## 2021-03-19 NOTE — Progress Notes (Signed)
[facility] PROGRESS NOTE  Charlene [name] VFI:[id number] DOB: 09-28-1965 DOA: 08/26/2020 PCP: Patient, [name] (Inactive)    October 08, 2020     10/16/2020                        10/16/2020     10/18/2020                        10/20/2020      12/30/2020                   12/30/2020     01/04/2021                   02/24/2021 Viv really needs a teddy bear!   Status: Remains inpatient appropriate because:Altered mental status and Unsafe d/c plan: Requirement to discharge to trach capable SNF   Dispo: The patient is from: Home              Anticipated d/c is to: SNF trach capable-clinicals resubmitted to multiple facilities as of 8/31.  Unable to decannulate 2/2 and lack of appropriate voluntary and involuntary cough response              Patient currently is medically stable to d/c.   Difficult to place patient Yes         Barriers to DC: Continued requirement for right hand mitt to prevent self decannulation.  This is considered a restraint by SNFs. patient is not doing this by accident she is purposefully D cannulating herself.   Level of care: Telemetry Medical  Code Status: Full Family Communication: husband Charlene [name] [phone]) is her POA and legal contact.  Charlene [name] by phone 8/19.  Mother at bedside 8/29-Dtr Charlene [name] 8/19 DVT prophylaxis: Chronically bedbound so Lovenox discontinued   Vaccination status: Unvaccinated-husband still undecided.  Micro Data:  2/9 COVID + Influenza negative 2/10 MRSA PCR negative 2/12 BCx 2 > no growth  2/19 BC 1/2 +Stap EPI, 1/2 no growth  10/07/2020 sputum positive for Serratia marcescens 4/8 Enterobacter cloaca + urine culture MDR sensitive only to imipenem and gentamicin   Antimicrobials:  Cefepime 2/12 > 2/15 Vanc 2/12 > 2/15 10/08/2020 ciprofloxacin>>3/30 Unasyn 3/30 >> 4/02 Rocephin 4/11 > 4/12 Meropenem 4/12>4/17 Meropenem 5/20 through 5/22 Ceftriaxone 5/22 through 5/26 Meropenem 6/3 Ceftriaxone 6/4 through 6/10 Septra 7/4  through 7/7 Ceftriaxone 7/29 was 1 dose followed by 1 dose of fosfomycin Cipro 9/2 >>   HPI: 55 yo F with 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  Subsequently on 2/10, she developed obtundation and respiratory arrest leading to intubation.  She has remained persistently obtunded, now has a trach and a PEG.  Hospital course complicated by persistent fever, Serratia pneumonia and now pre-orbital cellulitis  As of 3/25 with adjustments in hypertonicity medications patient began to improve regarding hypertonicity.  Eventually started on provigil with improvements in alertness.  Evaluated by rehab physician who recommended increasing provigil dose further.  Subsequently patient has become more alert.  Most days she is able to track and with encouragement able to follow simple commands especially since partial obstruction glasses placed.  See progress note dictated 4/27 for expanded details.   Significant events: 2/09 Admit for ICH 2/10 EVD placed, later intubated due to obtundation and respiratory arrest 2/11 MRI Brain, hypertonic saline d/c'd, EVD not working.  Husband did not consent to PICC  2/13  EVD removed 2/17 [place] discussion with family, PMT 2/24 Bedside trach and PEG 3/1 Transferred to [facility]  Subjective: Awake and alert.  Staff at bedside performing in and out catheterization for requested urinalysis and culture.  Interacts appropriately and smiling appropriately when jokes told.  Objective: Vitals:   03/19/21 0330 03/19/21 0400  BP:  (!) 146/99  Pulse: 82   Resp: 16 18  Temp:  99 F (37.2 C)  SpO2: 98%    Filed Weights   03/12/21 0500 03/16/21 0500 03/17/21 0500  Weight: 64.8 kg 65.1 kg 61.5 kg    Exam:       Gen: Sleeping soundly Respiratory: #4.0 cuffless trach, trach collar with FiO2 21% at 5L; sounds remain clear to auscultation.  Respiratory effort.  Normal pulse  oximetry. Cardiovascular: S1-S2, normotensive, no peripheral edema.  Warm to the touch Abdomen:  PEG /nocturnal tube feedings. LBM 8/30, nondistended, normoactive bowel sounds. Neurological: CN 2 -12 intact except for suspected hearing loss post stroke. Spontaneous purposeful movement of right upper extremity/strength 4/5.   RLE strength 2/5 with purposeful movement.  Persistent left-side neglect.   Psychiatric: Awake and alert.  Nodding appropriately and occasionally smiling.  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  Tracheostomy dependent -PCCM/trach team following closely regarding candidacy for decannulation. On 8/16 Dr. Dewald/PCCM discussed with patient's husband Charlene [name] regarding risks and benefits for decannulation and both Charlene [name] and family agree -Not yet a candidate for decannulation given lack of significant improvement thus far in terms of consistent cough strength and ability for her to cognitively understand and act on instructions to cough plus signs of impulsiveness and ongoing aspiration -Patient having throat/neck pain after multiple self decannulation's in the past several weeks.  Continue scheduled Tylenol and as needed Ultram  Microcytic anemia Patient with low iron in February 2015 Continues with low MCV and hemoglobin 11 We will repeat anemia profile today: Iron normal at 63.  B12 1243 normal folate  Fever TM 100.7 overnight and SLP noted lethargy yesterday Ck CXR ( ? Aspiration) Ck blood cxs, UA/cx (? Recurrent UTI) w/ CRP and PCT **Normal WBC, CRP normal and Pro-Cal <1.0 noting PCT typically only elevated and respiratory illnesses.  Blood cultures are  pending.  Discussed with attending and agrees with testing for flu and COVID PCR **UA abnormal and appears consistent with UTI.  Culture pending.  Most recent positive urine cultures have revealed ESBL Klebsiella and Enterobacter both of which are sensitive to Cipro so has been started on per tube Cipro until culture available  Constipation -Continue prn MiraLAX   Hypertensive emergency/Malignant HTN -Continue Norvasc, beta-blocker and hydralazine.  Dysphagia -Continue dysphagia 1 with thin liquids -Inconsistent oral intake therefore -continue nocturnal tube feedings -Patient complaining of painful or at least uncomfortable swallowing in context of trach  History of urinary retention -Continue Cardura    Other problems: Enterobacter cystitis -Urine culture with Enterobacter resistant to cefazolin, cefepime and Septra. -Previously given 2 days of IV Rocephin and since no signs of sepsis was given a one-time dose of fosfomycin over the weekend -Purewick dc'd due to contributing to recurrent UTIs  Right upper extremity thrombophlebitis - [name] -> age indeterminate SVT of right cephalic vein  Neck Contracture/torticollis -- Resolved  Recurrent right knee  pain/known ligamentous injury and chronic osteoarthritis -s/p injection of Marcaine and Depomedrol 6/30 -Continue scheduled Celebrex, Tylenol and Ultram  ESBL positive Enterobacter UTI -Has completed 5 days of meropenem -Contact isolation during hospitalization -continue Oxybutynin suspected bladder spasms  Klebsiella UTI -Resistant to nitrofurantoin and ampicillin -To minimize risk of recurrent UTI have asked that pure wick only be used at bedtime on a 10 PM to 8 AM schedule  Recurrent diarrhea -Resolved  Bacterial Conjunctivitis /periorbital cellulitis -Resolved  Chronic pansinusitis/recurrent fevers 2/2 aspiration PNA;History of Serratia Pneumonia  Hospital Acquired Pneumonia:  -Completed several courses of  antibiotics -Follow-up imaging including sinus CT consistent with chronic but stable and resolving sinusitis   Anemia -Stable  HLD -Continue Lipitor      Data Reviewed: Basic Metabolic Panel: Recent Labs  Lab 03/18/21 0240  NA 139  K 3.5  CL 101  CO2 27  GLUCOSE 131*  BUN 18  CREATININE 0.51  CALCIUM 9.3      Liver Function Tests: Recent Labs  Lab 03/18/21 0240  AST 28  ALT 50*  ALKPHOS 80  BILITOT 0.3  PROT 6.8  ALBUMIN 3.4*       CBC: Recent Labs  Lab 03/18/21 0240  WBC 5.0  NEUTROABS 2.8  HGB 11.0*  HCT 34.0*  MCV 77.6*  PLT 238       CBG: Recent Labs  Lab 03/17/21 0333 03/17/21 0602 03/17/21 1901 03/18/21 0202 03/18/21 0602  GLUCAP 150* 164* 95 132* 147*     Studies: No results found.   Scheduled Meds:  acetaminophen  650 mg Per Tube Q6H   amLODipine  10 mg Per Tube Daily   atorvastatin  40 mg Per Tube Daily   baclofen  5 mg Per Tube QHS   carvedilol  25 mg Per Tube BID WC   celecoxib  200 mg Per Tube BID   chlorhexidine gluconate (MEDLINE KIT)  15 mL Mouth Rinse BID   Chlorhexidine Gluconate Cloth  6 each Topical Daily   diclofenac Sodium  4 g Topical QID   doxazosin  2 mg Per Tube Daily   feeding supplement  237 mL Oral TID BM   feeding supplement (JEVITY 1.5 CAL/FIBER)  750 mL Per Tube Q24H   feeding supplement (PROSource TF)  45 mL Per Tube Daily   free water  200 mL Per Tube Q4H   hydrALAZINE  37.5 mg Per Tube Q8H   mouth rinse  15 mL Mouth Rinse 5 X Daily   modafinil  200 mg Per Tube Daily   multivitamin with minerals  1 tablet Per Tube Daily   pantoprazole sodium  40 mg Per Tube Daily   Continuous Infusions:  sodium chloride Stopped (10/26/20 1445)    Principal Problem:   ICH (intracerebral hemorrhage) ([place]) Active Problems:   Acute hypoxemic respiratory failure (HCC)   Tracheostomy dependent ([place])   Palliative care by specialist   Muscle hypertonicity   Oropharyngeal dysphagia   Poor prognosis    Aspiration pneumonia of right lower lobe (HCC)   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  Palliative care.   PCCM continues to follow.  No decannulation.   Procedures: Tracheostomy PEG tube EEG Echocardiogram  Antibiotics: Vancomycin 2/12-2/15 Maxipime 2/12-2/15 Ancef 2/19-2/27 Vancomycin 2/20 x 1 dose Maxipime 2/27- 3/5  Vancomycin 3/1-3/3 Ancef 3/6-3/14 Cipro 3/20 4-3/30 Unasyn 3/30-4/4 Rocephin 4/11-4/12 Meropenem 4/12-4/17   Time spent: 15 minutes    Erin Hearing ANP  [facility] 7 am - 330 pm/M-F for direct patient care and secure chat Please refer to [place] for contact info [phone]  Days

## 2021-03-19 NOTE — Progress Notes (Signed)
Attempted to give patient ensure. Intake was minimal.

## 2021-03-19 NOTE — Progress Notes (Signed)
Nutrition Follow-up  DOCUMENTATION CODES:  Not applicable  INTERVENTION:  -Continue to encourage PO intake & assist as needed -Continue Ensure Enlive po TID -Continue MVI with minerals daily  Continue nocturnal TF via PEG: -Jevity 1.5 @ 34ml/hr for 10 hours from 2000-0600 ( total) -42ml PROSource TF daily Provides 1165 kcals, 58 grams protein, free water -- meets ~73% minimum estimated calorie and protein needs -Free water flushes per MD, currently Q4H (provides total free water w/ TF and flushes)  NUTRITION DIAGNOSIS:  Inadequate oral intake related to inability to eat as evidenced by NPO status. 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  Subsequently on 2/10, pt developed obtundation and respiratory arrest leading to intubation. Pt is now s/p trach/PEG placement. Hospital course complicated by Serratia pneumonia, ESBL Enterobacter UTI and pre-orbital cellulitis.  2/10 s/p EVD placement and intubation for respiratory arrest 2/13 EVD removed 2/17 palliative care consult; family requesting tx to [facility] 2/24 s/p trach and PEG 3/1 tx to [facility] 3/11 trach changed to shiley #6 cuffless 4/08 pt w/ sepsis 2/2 UTI 4/18 28% ATC/5L 5/2 CIR reevaluated pt and determined she has not made enough improvement for admit; recommended SNF 5/8 trach changed to shiley #4 cuffless 5/23 CCM evaluated and determined pt stilll unable to decannulate due to variable mental status 6/3 trach exchanged, still shiley #4  cuffless 6/30 s/p R knee aspiration and injection 7/3 trach exchanged, still shiley #4 cuffless 7/8 diet advanced to dysphagia 1 with thin liquids 7/17 trach exchanged, still shiley #4 cuffless 7/21 trach replaced after pt self-decannulated  8/01 tolerating PMV, minimal secretions 8/02 PEG replaced after pt removed 8/14 trach exchanged, still shiley #4 cuffless 8/23 trach reinserted (still shiley #4 cuffless) after pt decannulated herself  Per NP, pt's current barrier to discharge is the continued requirement for R hand mitt to prevent self-decannulation as it is considered to be a restraint by SNF. Pt now awake and alert and responding appropriately.    Pt continues to have inadequate PO intake. Noted that pt has been c/o painful/uncomfortable swallowing in context of trach. 10-30% meal completion x4 recorded meals since last RD assessment. If pt continues to have improved PO intake and alertness, will order another calorie count and adjust nocturnal TF regimen appropriately.   Admission weight: 61.2 kg Current weight (as of 03/17/21): 61.5 kg  UOP: 3x unmeasured occurrences documented x24 hours  Medications: Ensure Enlive/Plus TID, mvi with minerals, protonix Labs reviewed. CBGs [phone]  Current TF: Jevity 1.5 @ 61ml/hr administered over 10 hours (2000-0600) to provide TF per night, 49ml Prosource TF daily, free water Q4H  Diet Order:   Diet Order             DIET - DYS 1 Room service appropriate? No; Fluid consistency: Thin  Diet effective now                  EDUCATION NEEDS:  No education needs have been identified at this time  Skin:  Skin Assessment: Reviewed RN Assessment  Last BM:  8/30  Height:  Ht Readings from Last 1 Encounters:  10/18/20 5\' 5"  (1.651 m)   Weight:  Wt Readings from Last 1 Encounters:  03/17/21 61.5 kg   Ideal Body Weight:  56.8 kg  BMI:  Body mass index is 22.56 kg/m.  Estimated Nutritional Needs:  Kcal:   1600-1800 Protein:  80-90 grams Fluid:  >1.6L/d   03/19/21, MS, RD, LDN (she/her/hers) RD pager number and weekend/on-call pager number located in Amion.

## 2021-03-20 LAB — CBC WITH DIFFERENTIAL/PLATELET
Abs Immature Granulocytes: 0.01 10*3/uL (ref 0.00–0.07)
Basophils Absolute: 0 10*3/uL (ref 0.0–0.1)
Basophils Relative: 1 %
Eosinophils Absolute: 0.1 10*3/uL (ref 0.0–0.5)
Eosinophils Relative: 1 %
HCT: 33 % — ABNORMAL LOW (ref 36.0–46.0)
Hemoglobin: 10.6 g/dL — ABNORMAL LOW (ref 12.0–15.0)
Immature Granulocytes: 0 %
Lymphocytes Relative: 34 %
Lymphs Abs: 2.2 10*3/uL (ref 0.7–4.0)
MCH: 25 pg — ABNORMAL LOW (ref 26.0–34.0)
MCHC: 32.1 g/dL (ref 30.0–36.0)
MCV: 77.8 fL — ABNORMAL LOW (ref 80.0–100.0)
Monocytes Absolute: 0.5 10*3/uL (ref 0.1–1.0)
Monocytes Relative: 7 %
Neutro Abs: 3.8 10*3/uL (ref 1.7–7.7)
Neutrophils Relative %: 57 %
Platelets: 251 10*3/uL (ref 150–400)
RBC: 4.24 MIL/uL (ref 3.87–5.11)
RDW: 15 % (ref 11.5–15.5)
WBC: 6.6 10*3/uL (ref 4.0–10.5)
nRBC: 0 % (ref 0.0–0.2)

## 2021-03-20 LAB — PROCALCITONIN: Procalcitonin: <0.1 ng/mL

## 2021-03-20 NOTE — Progress Notes (Signed)
Please review detailed progress note by Junious Silk on 9/1 for details regarding patient's hospital course and active medical problems.  S: Patient has essentially been poorly responsive.  Does not appear that she communicates much.  She is awake though.  She waves at me.  She does have a tracheostomy.  Does not appear to be in any discomfort.  O: Noted to have a temperature of 100.1 this morning.  Heart rate 99, respiratory rate 19, blood pressure 123/71, saturations are 99% on trach collar at 5 L/min.  Coarse breath sounds bilaterally.  Few crackles at the bases.  No wheezing or rhonchi. Tracheostomy is noted. Abdomen is soft.  Nontender nondistended.  PEG tube is noted. No edema noted bilateral lower extremities.  A/P:  This is a 55 year old female with hypertensive right thalamic/basal ganglia intracranial hemorrhage with resultant hydrocephalus prompting external ventricular drain placement back in February.  This was followed by a respiratory arrest leading to intubation.  Has been obtunded since then.  Now has a tracheostomy and a PEG tube. She is currently tracheostomy dependent.  Hypertension managed with multiple antihypertensives.  Has dysphagia for which she has a PEG tube.  Previously had Enterobacter as well as ESBL Klebsiella UTIs.  Most recently she developed fever 2 days ago.  Urine suggested infection.  Cultures are pending.  She does not have a Foley catheter currently.  Based on previous cultures patient was placed on ciprofloxacin.  It is noted that she does have history of ESBL Klebsiella but that was before she was positive for Enterobacter which was also sensitive to ciprofloxacin.  Reasonable to continue to ciprofloxacin for now and we can adjust treatment based on culture results.  COVID test was also done which was negative.  Patient noted to be stable for the most part.  No evidence for sepsis currently.  We will continue to monitor closely.  WBC is noted to be  normal.  Procalcitonin less than 0.1.  Please review Ms. Ellis's note from 03/19/21 for further details.  Will continue to follow on a daily basis.  Osvaldo Shipper 03/20/2021

## 2021-03-21 LAB — URINE CULTURE: Culture: >=100000 — AB

## 2021-03-21 LAB — PROCALCITONIN: Procalcitonin: <0.1 ng/mL

## 2021-03-21 NOTE — Progress Notes (Signed)
Please review detailed progress note by Junious Silk on 9/1 for details regarding patient's hospital course and active medical problems.  S: Patient remains awake but does not communicate much due to underlying medical issues.    O: Temperature noted to be 98.1 this morning.  T-max remains 100.1 as of yesterday morning.  None since then.  Heart rate is stable in the 80s.  Respiratory stable at 16.  Blood pressure 123/60.  Saturations 100% on trach collar at 5 L/min.    Noted to have eyes open. Coarse breath sound bilaterally.  Few crackles at the bases.  No wheezing or rhonchi. Tracheostomy noted. Abdomen soft.  PEG tube is noted.  A/P:  This is a 55 year old female with hypertensive right thalamic/basal ganglia intracranial hemorrhage with resultant hydrocephalus prompting external ventricular drain placement back in February.  This was followed by a respiratory arrest leading to intubation.  Has been obtunded since then.  Now has a tracheostomy and a PEG tube. She is currently tracheostomy dependent.  Hypertension managed with multiple antihypertensives.  Has dysphagia for which she has a PEG tube.  Previously had Enterobacter as well as ESBL Klebsiella UTIs.  Most recently she developed fever 3 days ago.  Urine suggested infection.  She does not have a Foley catheter currently.  Based on previous cultures patient was placed on ciprofloxacin.  It is noted that she does have history of ESBL Klebsiella but that was before she was positive for Enterobacter which was also sensitive to ciprofloxacin.  Patient was continued on ciprofloxacin.  Urine culture is growing Pseudomonas sensitive to Cipro.  Plan for 5 to 7-day course.  Today will be day 3.  Patient stable for the most part.  Fever appears to be subsiding.  WBC were never elevated.  Procalcitonin was less than 0.1.  Continue to monitor.    Please review Ms. Ellis's note from 03/19/21 for further details.  Will continue to follow on a daily  basis.  Osvaldo Shipper 03/21/2021

## 2021-03-22 DIAGNOSIS — B965 Pseudomonas (aeruginosa) (mallei) (pseudomallei) as the cause of diseases classified elsewhere: Secondary | ICD-10-CM

## 2021-03-22 DIAGNOSIS — N390 Urinary tract infection, site not specified: Secondary | ICD-10-CM | POA: Diagnosis not present

## 2021-03-22 MED ORDER — CELECOXIB 200 MG PO CAPS
200.0000 mg | ORAL_CAPSULE | Freq: Every day | ORAL | Status: DC
  Administered 2021-03-23 – 2021-04-05 (×14): 200 mg
  Filled 2021-03-22 (×14): qty 1

## 2021-03-22 NOTE — Progress Notes (Signed)
Occupational Therapy Treatment Patient Details Name: Charlene [name] MRN: [id number] DOB: 01-26-1966 Today's Date: 03/22/2021    History of present illness Charlene [name] is 55 y/o female presents to [facility] on 08/26/20 with L sided weakness and headaches. 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 s/p Right frontal ventricular catheter placement on 2/10, stopped functioning on 2/11.  MRI 2/11 shows right greater than left mesial temporal lobes / parahippocampal cortex, bilateral basal ganglia, and possibly the upper pons that are concerning for acute infarcts, most likely secondary to mass effect from the hemorrhage. ETT 2/10-2/24.  Charlene [name] with trach and PEG on 09/10/20. Hospital course complicated by persistent fever, Serratia pneumonia and now pre-orbital cellulitis.   Charlene [name] self decannulated 02/04/21, trach re-inserted by RT. Charlene [name] dislodged PEG, guided replacement 8/2.  PMH includes anxiety, HTN.   OT comments  Charlene [name] required max A to maintain EOB sitting this date.  She was noted to push heavily to the Lt with her Rt UE.  She followed only 2 one step commands with a delay, and was able to wipe her mouth.  She had mitt on Rt hand initially and was hyper focused on attempting to remove it.   PROM performed Lt UE.     Follow Up Recommendations  SNF;Supervision/Assistance - 24 hour    Equipment Recommendations  None recommended by OT    Recommendations for Other Services      Precautions / Restrictions Precautions Precautions: Fall Precaution Comments: peg and trach, R mitt Required Braces or Orthoses: Other Brace Splint/Cast - Date Prophylactic Dressing Applied (if applicable): 03/22/21 Other Brace: bil. PRAFOs, soft R knee extension splint and soft L elbow extension splint       Mobility Bed Mobility Overal bed mobility: Needs Assistance Bed Mobility: Supine to Sit;Sit to  Supine Rolling: Max assist   Supine to sit: Max assist Sit to supine: Max assist   General bed mobility comments: Charlene [name] required assist for all aspects    Transfers                      Balance Overall balance assessment: Needs assistance Sitting-balance support: Feet supported;Single extremity supported Sitting balance-Leahy Scale: Poor Sitting balance - Comments: Charlene [name] pushing heavily with Rt UE this date and required max A for EOB sitting.  Attempted to engage Charlene [name] with Rt UE to reduce pushing, but she followed commands only minimally Postural control: Posterior lean;Left lateral lean                                 ADL either performed or assessed with clinical judgement   ADL Overall ADL's : Needs assistance/impaired     Grooming: Wash/dry face;Minimal assistance;Bed level Grooming Details (indicate cue type and reason): Charlene [name] able to wife mouth when cued with a delay x 2.                             Functional mobility during ADLs: Maximal assistance       Vision   Additional Comments: with a delay, Charlene [name] looked to Lt when cued   Perception     Praxis      Cognition Arousal/Alertness: Awake/alert Behavior During Therapy: Flat affect Overall Cognitive Status: Impaired/Different from baseline Area of Impairment: Attention;Following commands  Current Attention Level: Sustained   Following Commands: Follows one step commands inconsistently;Follows one step commands with increased time     Problem Solving: Slow processing;Decreased initiation;Difficulty sequencing;Requires verbal cues;Requires tactile cues General Comments: Charlene [name] very fixated on the mitt on her Rt hand trying to remove it.  Once the mitt was removed, she was able to focus her attention briefly on therapist and tasks at hand.  She followed 2 one step commands today with a delay        Exercises Other Exercises Other Exercises: soft tissue mobilization  and passive stretch Lt UE - achieved ~-25*   Shoulder Instructions       General Comments      Pertinent Vitals/ Pain       Pain Assessment: Faces Faces Pain Scale: No hurt  Home Living                                          Prior Functioning/Environment              Frequency  Min 1X/week        Progress Toward Goals  OT Goals(current goals can now be found in the care plan section)  Progress towards OT goals: Progressing toward goals     Plan Discharge plan remains appropriate;Frequency needs to be updated    Co-evaluation                 AM-PAC OT "6 Clicks" Daily Activity     Outcome Measure   Help from another person eating meals?: A Lot Help from another person taking care of personal grooming?: A Lot Help from another person toileting, which includes using toliet, bedpan, or urinal?: Total Help from another person bathing (including washing, rinsing, drying)?: Total Help from another person to put on and taking off regular upper body clothing?: A Lot Help from another person to put on and taking off regular lower body clothing?: Total 6 Click Score: 9    End of Session Equipment Utilized During Treatment: Oxygen  OT Visit Diagnosis: Muscle weakness (generalized) (M62.81);Apraxia (R48.2);Cognitive communication deficit (R41.841);Hemiplegia and hemiparesis Symptoms and signs involving cognitive functions: Other Nontraumatic ICH Hemiplegia - Right/Left: Right Hemiplegia - caused by: Nontraumatic intracerebral hemorrhage   Activity Tolerance Patient tolerated treatment well   Patient Left in bed;with call bell/phone within reach   Nurse Communication Mobility status        Time: 8299-3716 OT Time Calculation (min): 23 min  Charges: OT General Charges $OT Visit: 1 Visit OT Treatments $Neuromuscular Re-education: 23-37 mins  Eber Jones OTR/L Acute Rehabilitation Services Pager [phone] Office  [phone]    Jeani Hawking M 03/22/2021, 4:39 PM

## 2021-03-22 NOTE — Progress Notes (Addendum)
[facility] PROGRESS NOTE  Charlene [name] VCB:[id number] DOB: 1965-11-24 DOA: 08/26/2020 PCP: Patient, [name] (Inactive)    October 08, 2020     10/16/2020                        10/16/2020     10/18/2020                        10/20/2020      12/30/2020                   12/30/2020     01/04/2021                   02/24/2021 Viv really needs a teddy bear!   Status: Remains inpatient appropriate because:Altered mental status and Unsafe d/c plan: Requirement to discharge to trach capable SNF   Dispo: The patient is from: Home              Anticipated d/c is to: SNF trach capable-clinicals resubmitted to multiple facilities as of 8/31.  Unable to decannulate 2/2 and lack of appropriate voluntary and involuntary cough response              Patient currently is medically stable to d/c.   Difficult to place patient Yes         Barriers to DC: Continued requirement for right hand mitt to prevent self decannulation.  This is considered a restraint by SNFs. patient is not doing this by accident she is purposefully D cannulating herself.   Level of care: Telemetry Medical  Code Status: Full Family Communication: [name] [phone]) is her POA and legal contact.  [name] by phone 8/19.  Mother at bedside 8/29-Dtr [name] 8/19 DVT prophylaxis: Chronically bedbound so Lovenox discontinued   Vaccination status: Unvaccinated-husband still undecided.  Micro Data:  2/9 COVID + Influenza negative 2/10 MRSA PCR negative 2/12 BCx 2 > no growth  2/19 BC 1/2 +Stap EPI, 1/2 no growth  10/07/2020 sputum positive for Serratia marcescens 4/8 Enterobacter cloaca + urine culture MDR sensitive only to imipenem and gentamicin 5/20 urine positive for Klebsiella resistant to ampicillin and Macrodantin 6/2 urine positive for Klebsiella resistant to ampicillin, cefazolin and Macrodantin 7/4 urine positive for ESBL Klebsiella resistant to ampicillin, cefazolin and Macrodantin 7/28 urine positive for  Enterobacter resistant to cefazolin, cefepime and Septra 9/2 urine positive for pansensitive Pseudomonas     Antimicrobials:  Cefepime 2/12 > 2/15 Vanc 2/12 > 2/15 10/08/2020 ciprofloxacin>>3/30 Unasyn 3/30 >> 4/02 Rocephin 4/11 > 4/12 Meropenem 4/12>4/17 Meropenem 5/20 through 5/22 Ceftriaxone 5/22 through 5/26 Meropenem 6/3 Ceftriaxone 6/4 through 6/10 Septra 7/4 through 7/7 Ceftriaxone 7/29 was 1 dose followed by 1 dose of fosfomycin Cipro 9/2 >>   HPI: 55 yo F with 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  Subsequently on 2/10, she developed obtundation and respiratory arrest leading to intubation.  She has remained persistently obtunded, now has a trach and a PEG.  Hospital course complicated by persistent fever, Serratia pneumonia and now pre-orbital cellulitis  As of 3/25 with adjustments in hypertonicity medications patient began to improve regarding hypertonicity.  Eventually started on provigil with improvements in alertness.  Evaluated by rehab physician who recommended increasing provigil dose further.  Subsequently patient has become more alert.  Most days she is able to track and with encouragement able to follow simple commands  especially since partial obstruction glasses placed.  See progress note dictated 4/27 for expanded details.   Significant events: 2/09 Admit for ICH 2/10 EVD placed, later intubated due to obtundation and respiratory arrest 2/11 MRI Brain, hypertonic saline d/c'd, EVD not working.  Husband did not consent to PICC  2/13 EVD removed 2/17 [place] discussion with family, PMT 2/24 Bedside trach and PEG 3/1 Transferred to [facility]  Subjective: Patient awake.  Clearly frustrated with mitten in place to prevent accidental dislodgment of trach.  Discussed with patient that she should not pull at trach if she wants her mitten off.  She nodded in agreement.  Also discussed with  patient plans to obtain a specialty call light that she will be able to push that can be attached to her down at the chest.  She was in agreement to being taught how to use this.  Objective: Vitals:   03/22/21 0427 03/22/21 0743  BP:  139/89  Pulse: 86 86  Resp: 17 17  Temp:  98.5 F (36.9 C)  SpO2: 99% 100%   Filed Weights   03/12/21 0500 03/16/21 0500 03/17/21 0500  Weight: 64.8 kg 65.1 kg 61.5 kg    Exam:       Gen: Awake, alert and in no acute distress although frustrated at [employer] restraint on hand Respiratory: #4.0 cuffless trach, trach collar with FiO2 21% at 5L; lung sounds remain clear to auscultation without increased work of breathing.  No tracheal secretions. Cardiovascular: S1-S2, normotensive, Extremities warm to the touch with appropriate capillary refill Abdomen:  PEG /nocturnal tube feedings. LBM 9/04, nondistended, normoactive bowel sounds. Neurological: CN 2 -12 intact. Spontaneous purposeful movement of right upper extremity/strength 4/5.   RLE strength 2/5 with purposeful movement.  Persistent left-side neglect.   Psychiatric: Awake and alert.  Nodding to questions asked.  Assessment/Plan: Acute problems: Multiple acute strokes  Right Thalamus ICH with IVH  Obstructive Hydrocephalus/left-sided neglect /Non TBI related hypertonicity -Continue HS baclofen 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 although when patient's mother wrote to the patient which year do you hear out of best the patient wrote back "both".  At this point I do not suspect any acute hearing loss -Patient has been writing her name and responding to written questions by writing back.  Tracheostomy dependent -PCCM/trach team following closely regarding candidacy for decannulation. On 8/16 Dr. Dewald/PCCM discussed with patient's husband [name] regarding risks and benefits for decannulation and both [name] and family agree -Not yet a candidate for decannulation given lack of significant improvement  thus far in terms of consistent cough strength and ability for her to cognitively understand and act on instructions to cough plus signs of impulsiveness and ongoing aspiration -We will trial removal of mitten with sitter at bedside -in addition we will obtain a specialty call like that the patient should be able to use-patient nodded that she understood she was not to pull her trach out  Microcytic anemia Patient with low iron in February 2015 Continues with low MCV and hemoglobin 11 We will repeat anemia profile today: Iron normal at 63.  B12 1243 normal folate  Fever 2/2 Pseudomonas UTI TM 100.7  Urine culture positive for pansensitive Pseudomonas Continue Cipro for a total of 7 days  Constipation -Continue prn MiraLAX   Hypertensive emergency/Malignant HTN -Continue Norvasc, beta-blocker and hydralazine.  Dysphagia -Continue dysphagia 1 with thin liquids -Inconsistent oral intake therefore -continue nocturnal tube feedings -Patient complaining of painful or at least uncomfortable swallowing in context of trach  History  of urinary retention -Continue Cardura    Other problems: Enterobacter cystitis -Urine culture with Enterobacter resistant to cefazolin, cefepime and Septra. -Previously given 2 days of IV Rocephin and since no signs of sepsis was given a one-time dose of fosfomycin over the weekend -Purewick dc'd due to contributing to recurrent UTIs  Right upper extremity thrombophlebitis - [name] -> age indeterminate SVT of right cephalic vein  Neck Contracture/torticollis -- Resolved  Recurrent right knee pain/known ligamentous injury and chronic osteoarthritis -s/p injection of Marcaine and Depomedrol 6/30 -9/5 decreased Celebrex 200 mg to at bedtime only  ESBL positive Enterobacter UTI -Has completed 5 days of meropenem -Contact isolation during hospitalization -continue Oxybutynin suspected bladder spasms  Klebsiella UTI -Resistant to nitrofurantoin and  ampicillin -To minimize risk of recurrent UTI have asked that pure wick only be used at bedtime on a 10 PM to 8 AM schedule  Recurrent diarrhea -Resolved  Bacterial Conjunctivitis /periorbital cellulitis -Resolved  Chronic pansinusitis/recurrent fevers 2/2 aspiration PNA;History of Serratia Pneumonia  Hospital Acquired Pneumonia:  -Completed several courses of antibiotics -Follow-up imaging including sinus CT consistent with chronic but stable and resolving sinusitis   Anemia -Stable  HLD -Continue Lipitor      Data Reviewed: Basic Metabolic Panel: Recent Labs  Lab 03/18/21 0240  NA 139  K 3.5  CL 101  CO2 27  GLUCOSE 131*  BUN 18  CREATININE 0.51  CALCIUM 9.3      Liver Function Tests: Recent Labs  Lab 03/18/21 0240  AST 28  ALT 50*  ALKPHOS 80  BILITOT 0.3  PROT 6.8  ALBUMIN 3.4*       CBC: Recent Labs  Lab 03/18/21 0240 03/19/21 0829 03/20/21 1732  WBC 5.0 4.9 6.6  NEUTROABS 2.8 2.5 3.8  HGB 11.0* 10.7* 10.6*  HCT 34.0* 33.6* 33.0*  MCV 77.6* 77.6* 77.8*  PLT 238 263 251       CBG: Recent Labs  Lab 03/17/21 1901 03/18/21 0202 03/18/21 0602 03/19/21 0203 03/19/21 0608  GLUCAP 95 132* 147* 117* 125*     Studies: No results found.   Scheduled Meds:  acetaminophen  650 mg Per Tube Q6H   amLODipine  10 mg Per Tube Daily   atorvastatin  40 mg Per Tube Daily   baclofen  5 mg Per Tube QHS   carvedilol  25 mg Per Tube BID WC   celecoxib  200 mg Per Tube BID   chlorhexidine gluconate (MEDLINE KIT)  15 mL Mouth Rinse BID   Chlorhexidine Gluconate Cloth  6 each Topical Daily   ciprofloxacin  500 mg Per Tube BID   diclofenac Sodium  4 g Topical QID   doxazosin  2 mg Per Tube Daily   feeding supplement  237 mL Oral TID BM   feeding supplement (JEVITY 1.5 CAL/FIBER)  750 mL Per Tube Q24H   feeding supplement (PROSource TF)  45 mL Per Tube Daily   free water  200 mL Per Tube Q4H   hydrALAZINE  37.5 mg Per Tube Q8H   mouth  rinse  15 mL Mouth Rinse 5 X Daily   modafinil  200 mg Per Tube Daily   multivitamin with minerals  1 tablet Per Tube Daily   pantoprazole sodium  40 mg Per Tube Daily   Continuous Infusions:  sodium chloride Stopped (10/26/20 1445)    Principal Problem:   ICH (intracerebral hemorrhage) ([place]) Active Problems:   Acute hypoxemic respiratory failure (HCC)   Tracheostomy dependent ([place])   Palliative  care by specialist   Muscle hypertonicity   Oropharyngeal dysphagia   Poor prognosis   Aspiration pneumonia of right lower lobe (HCC)   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  Palliative care.   PCCM continues to follow.  No decannulation.   Procedures: Tracheostomy PEG tube EEG Echocardiogram  Antibiotics: Vancomycin 2/12-2/15 Maxipime 2/12-2/15 Ancef 2/19-2/27 Vancomycin 2/20 x 1 dose Maxipime 2/27- 3/5 Vancomycin 3/1-3/3 Ancef 3/6-3/14 Cipro 3/20 4-3/30 Unasyn 3/30-4/4 Rocephin 4/11-4/12 Meropenem 4/12-4/17 Cipro 9/2 >>   Time spent: 15 minutes    Erin Hearing ANP  [facility] 7 am - 330 pm/M-F for direct patient care and secure chat Please refer to [place] for contact info [phone]  Days

## 2021-03-23 LAB — GLUCOSE, CAPILLARY
Glucose-Capillary: 110 mg/dL — ABNORMAL HIGH (ref 70–99)
Glucose-Capillary: 126 mg/dL — ABNORMAL HIGH (ref 70–99)
Glucose-Capillary: 128 mg/dL — ABNORMAL HIGH (ref 70–99)
Glucose-Capillary: 144 mg/dL — ABNORMAL HIGH (ref 70–99)
Glucose-Capillary: 102 mg/dL — ABNORMAL HIGH (ref 70–99)
Glucose-Capillary: 118 mg/dL — ABNORMAL HIGH (ref 70–99)
Glucose-Capillary: 115 mg/dL — ABNORMAL HIGH (ref 70–99)

## 2021-03-23 NOTE — Plan of Care (Signed)
  Problem: Nutrition: Goal: Risk of aspiration will decrease Outcome: Progressing   Problem: Intracerebral Hemorrhage Tissue Perfusion: Goal: Complications of Intracerebral Hemorrhage will be minimized Outcome: Progressing   Problem: Clinical Measurements: Goal: Ability to maintain clinical measurements within normal limits will improve Outcome: Progressing Goal: Diagnostic test results will improve Outcome: Progressing Goal: Respiratory complications will improve Outcome: Progressing Goal: Cardiovascular complication will be avoided Outcome: Progressing   Problem: Coping: Goal: Level of anxiety will decrease Outcome: Progressing   Problem: Elimination: Goal: Will not experience complications related to bowel motility Outcome: Progressing Goal: Will not experience complications related to urinary retention Outcome: Progressing   Problem: Pain Managment: Goal: General experience of comfort will improve Outcome: Progressing   Problem: Skin Integrity: Goal: Risk for impaired skin integrity will decrease Outcome: Progressing   Problem: Intracerebral Hemorrhage Tissue Perfusion: Goal: Complications of Intracerebral Hemorrhage will be minimized Outcome: Progressing   Problem: Ischemic Stroke/TIA Tissue Perfusion: Goal: Complications of ischemic stroke/TIA will be minimized Outcome: Progressing

## 2021-03-23 NOTE — Progress Notes (Signed)
NAME:  Charlene [name], MRN:  [id number], DOB:  [birth year], LOS: 208 ADMISSION DATE:  08/26/2020, CONSULTATION DATE:  2/10 REFERRING MD:  Roda Shutters, CHIEF COMPLAINT:  headache   History of Present Illness:  55 year old female admitted with hypertensive R thalamic/basal ganglia ICH. Resultant obstructive hydrocephalus prompting EVD placement 2/10. Subsequently, 2/10 she had acute mental status change to obtundation and respiratory arrest resulting in intubation. Trach and Peg 2/24. Trach change to #6 cuffless on 3/11.   Hospital course complicated by Serratia pneumonia, ESBL UTI, preorbital cellulitis  Pertinent  Medical History  Hypertension, Medication non-compliance  Significant Hospital Events: Including procedures, antibiotic start and stop dates in addition to other pertinent events   2/09 Admit for ICH 2/10 EVD placed, later intubated due to obtundation and respiratory arrest 2/11 MRI Brain, hypertonic saline d/c'd, EVD not working.  Husband did not consent to PICC  2/13 EVD removed 2/17 [place] discussion with family, PMT 2/24 tracheostomy placed by Dr. Denese Killings, PEG placed by Dr. Sheliah Hatch 3/11 trach changed to [place] 6 uncuffed 3/21 sputum culture with Serratia resistant to Ancef 4/18 28% ATC / 5L  5/08 downsized trach to 4 7/11 No significant secretions / no issues with trach 7/21 Self de-cannulated, trach replaced 7/25 ATC 21%/5L. Using PSV, some speech per SLP, eating 8/1 tolerating PMV, minimal secretions 8/22 Per SLP-- delayed cough, leaking fluids from her mouth, still needs pureed diet. Speech unintelligible, but able to attempt to say names with SLP. Has still had episodes where she is too sleepy to participate with therapies. Family at bedside today-- she thinks [name] is pretty awake today. 8/23 Pt pulled trach out, reinserted w/o difficulty  Interim History / Subjective:   Remains on tracheostomy collar No acute events  Objective   Blood pressure 136/83, pulse 86,  temperature 98.8 F (37.1 C), temperature source Oral, resp. rate 20, height 5\' 5"  (1.651 m), weight 67.5 kg, last menstrual period 03/03/2014, SpO2 99 %, unknown if currently breastfeeding.    FiO2 (%):  [21 %] 21 %   Intake/Output Summary (Last 24 hours) at 03/23/2021 1516 Last data filed at 03/23/2021 1426 Gross per 24 hour  Intake 30 ml  Output --  Net 30 ml   Filed Weights   03/16/21 0500 03/17/21 0500 03/23/21 0346  Weight: 65.1 kg 61.5 kg 67.5 kg    Examination:  General:  Resting comfortably in bed HENT: NCAT OP clear tracheostomy in place PULM: CTA B, normal effort, has cough with stimulation from tracheal suction catheter CV: RRR, no mgr GI: BS+, soft, nontender MSK: normal bulk and tone Neuro: awake, waves to me when I am in the room, can't cough on command   Resolved Hospital Problem list     Assessment & Plan:   Tracheostomy Dependent after suffering intracerebral hemorrhage in February 2022 Dysphagia Ongoing NM weakness and cognitive deficits that limit trach decannulation safely, cough noted on 9/6 exam but over the last several weeks her cough has been less consistent. In discussion with her husband, her mental status and physical movements wax and wane after some medications which can make her drowsy.  Able to move arm, leg, but not able to reposition herself, sit up, etc. Given all this her risk of aspiration and respiratory failure remain very high.  Discussed with her husband 11/6 who agrees.  -needs tracheostomy, no plans to decannulate -trach care per routine -continue speaking valve -SNF recommended per protocol -SLP efforts, recommendations for dysphagia 1 diet -PT/OT   PCCM will see  weekly or sooner if needed  Heber [place], MD [place] [facility] Pager: [phone] Cell: [phone] After 7:00 pm call Elink  [phone]

## 2021-03-23 NOTE — TOC Progression Note (Addendum)
Transition of Care [facility]) - Progression Note    Patient Details  Name: Charlene [name] MRN: [id number] Date of Birth: [birth year]  Transition of Care [facility] CM/SW [place], RN Phone Number: 03/23/2021, 11:46 AM  Clinical Narrative:    Case management met with the patient at the bedside along with Erin Hearing, NP.  We spoke to bedside nursing about discontinuing right neuro mitt at this time and providing a bedside sitter and specialized call bell for the patient.  SNF facilities would not be able to offer an admissions bed considering patient has right hand neuro mitt at this time.  There are no current bed offers at this time, but Gayla Medicus, CM with [facility] facilities is exploring options for admission at this time. [place], CM states that she is having [facility] review the patient at this time.  The patient's family is agreeable to COVID vaccine at this time and Erin Hearing, NP is aware and will place order for vaccine when able.  CM spoke with Lenna Sciara, CM at [facility] and the facility was unable to offer an admission bed at this time since the patient would need a private room due to her ESBL isolation.  The facility will reach out to me when a private room is available at the facility when open.   CM and MSW with DTP Team will continue to follow the patient for SNF admission - no available bed offers at this time.   Expected Discharge Plan: Skilled Nursing Facility Barriers to Discharge: Continued Medical Work up  Expected Discharge Plan and Services Expected Discharge Plan: [place] In-house Referral: Clinical Social Work Discharge Planning Services: CM Consult Post Acute Care Choice: [place] arrangements for the past 2 months: Single Family Home                                       Social Determinants of Health (SDOH) Interventions    Readmission Risk Interventions Readmission Risk  Prevention Plan 09/29/2020  Transportation Screening Complete  PCP or Specialist Appt within 3-5 Days Complete  HRI or [place] Complete  Social Work Consult for [place] Planning/Counseling Complete  Palliative Care Screening Complete  Medication Review Press photographer) Complete  Some recent data might be hidden

## 2021-03-23 NOTE — Progress Notes (Signed)
[facility] PROGRESS NOTE  Charlene [name] VCB:[id number] DOB: 1965-11-24 DOA: 08/26/2020 PCP: Patient, [name] (Inactive)    October 08, 2020     10/16/2020                        10/16/2020     10/18/2020                        10/20/2020      12/30/2020                   12/30/2020     01/04/2021                   02/24/2021 Viv really needs a teddy bear!   Status: Remains inpatient appropriate because:Altered mental status and Unsafe d/c plan: Requirement to discharge to trach capable SNF   Dispo: The patient is from: Home              Anticipated d/c is to: SNF trach capable-clinicals resubmitted to multiple facilities as of 8/31.  Unable to decannulate 2/2 and lack of appropriate voluntary and involuntary cough response              Patient currently is medically stable to d/c.   Difficult to place patient Yes         Barriers to DC: Continued requirement for right hand mitt to prevent self decannulation.  This is considered a restraint by SNFs. patient is not doing this by accident she is purposefully D cannulating herself.   Level of care: Telemetry Medical  Code Status: Full Family Communication: husband Charlene [name] [phone]) is her POA and legal contact.  Charlene [name] by phone 8/19.  Mother at bedside 8/29-Dtr [name] 8/19 DVT prophylaxis: Chronically bedbound so Lovenox discontinued   Vaccination status: Unvaccinated-husband still undecided.  Micro Data:  2/9 COVID + Influenza negative 2/10 MRSA PCR negative 2/12 BCx 2 > no growth  2/19 BC 1/2 +Stap EPI, 1/2 no growth  10/07/2020 sputum positive for Serratia marcescens 4/8 Enterobacter cloaca + urine culture MDR sensitive only to imipenem and gentamicin 5/20 urine positive for Klebsiella resistant to ampicillin and Macrodantin 6/2 urine positive for Klebsiella resistant to ampicillin, cefazolin and Macrodantin 7/4 urine positive for ESBL Klebsiella resistant to ampicillin, cefazolin and Macrodantin 7/28 urine positive for  Enterobacter resistant to cefazolin, cefepime and Septra 9/2 urine positive for pansensitive Pseudomonas     Antimicrobials:  Cefepime 2/12 > 2/15 Vanc 2/12 > 2/15 10/08/2020 ciprofloxacin>>3/30 Unasyn 3/30 >> 4/02 Rocephin 4/11 > 4/12 Meropenem 4/12>4/17 Meropenem 5/20 through 5/22 Ceftriaxone 5/22 through 5/26 Meropenem 6/3 Ceftriaxone 6/4 through 6/10 Septra 7/4 through 7/7 Ceftriaxone 7/29 was 1 dose followed by 1 dose of fosfomycin Cipro 9/2 >>   HPI: 55 yo F with 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  Subsequently on 2/10, she developed obtundation and respiratory arrest leading to intubation.  She has remained persistently obtunded, now has a trach and a PEG.  Hospital course complicated by persistent fever, Serratia pneumonia and now pre-orbital cellulitis  As of 3/25 with adjustments in hypertonicity medications patient began to improve regarding hypertonicity.  Eventually started on provigil with improvements in alertness.  Evaluated by rehab physician who recommended increasing provigil dose further.  Subsequently patient has become more alert.  Most days she is able to track and with encouragement able to follow simple commands  especially since partial obstruction glasses placed.  See progress note dictated 4/27 for expanded details.   Significant events: 2/09 Admit for ICH 2/10 EVD placed, later intubated due to obtundation and respiratory arrest 2/11 MRI Brain, hypertonic saline d/c'd, EVD not working.  Husband did not consent to PICC  2/13 EVD removed 2/17 [place] discussion with family, PMT 2/24 Bedside trach and PEG 3/1 Transferred to [facility]  Subjective: Alert, again reminded patient that when Mitten is off she should not pull out her trach tube.  Then reinforced that we are trying to obtain a specialty call light so if she has any concerns regarding her trach or other issues she can  call staff  Objective: Vitals:   03/23/21 1146 03/23/21 1222  BP:  136/83  Pulse:    Resp:  20  Temp:  98.8 F (37.1 C)  SpO2: 99%    Filed Weights   03/16/21 0500 03/17/21 0500 03/23/21 0346  Weight: 65.1 kg 61.5 kg 67.5 kg    Exam:       Gen: Awake, alert and in no acute distress  Respiratory: #4.0 cuffless trach, trach collar with FiO2 21% at 5L; lung sounds remain clear to auscultation without increased work of breathing.  No tracheal secretions. Cardiovascular: S1-S2, normotensive, no peripheral edema, adequate capillary refill Abdomen:  PEG /nocturnal tube feedings. LBM 9/04, nondistended, normoactive bowel sounds. Neurological: CN 2 -12 intact. Spontaneous purposeful movement of right upper extremity/strength 4/5.   RLE strength 2/5 with purposeful movement.  Persistent left-side neglect.   Psychiatric: Awake and alert.  Nodding to questions asked.  Assessment/Plan: Acute problems: Multiple acute strokes  Right Thalamus ICH with IVH  Obstructive Hydrocephalus/left-sided neglect /Non TBI related hypertonicity -Continue HS baclofen 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 although when patient's mother wrote to the patient which year do you hear out of best the patient wrote back "both".  At this point I do not suspect any acute hearing loss -Patient has been writing her name and responding to written questions by writing back.  Tracheostomy dependent -PCCM/trach team following closely regarding candidacy for decannulation. On 8/16 Dr. Dewald/PCCM discussed with patient's husband Charlene [name] regarding risks and benefits for decannulation and both Charlene [name] and family agree -Not yet a candidate for decannulation given lack of significant improvement thus far in terms of consistent cough strength and ability for her to cognitively understand and act on instructions to cough plus signs of impulsiveness and ongoing aspiration -We will trial removal of mitten with sitter at bedside -in  addition we will obtain a specialty call like that the patient should be able to use-patient nodded that she understood she was not to pull her trach out  Microcytic anemia Patient with low iron in February 2015 Continues with low MCV and hemoglobin 11 We will repeat anemia profile today: Iron normal at 63.  B12 1243 normal folate  Fever 2/2 Pseudomonas UTI TM 100.7  Urine culture positive for pansensitive Pseudomonas Continue Cipro for a total of 7 days  Constipation -Continue prn MiraLAX   Hypertensive emergency/Malignant HTN -Continue Norvasc, beta-blocker and hydralazine.  Dysphagia -Continue dysphagia 1 with thin liquids -Inconsistent oral intake therefore -continue nocturnal tube feedings -Patient complaining of painful or at least uncomfortable swallowing in context of trach  History of urinary retention -Continue Cardura    Other problems: Enterobacter cystitis -Urine culture with Enterobacter resistant to cefazolin, cefepime and Septra. -Previously given 2 days of IV Rocephin and since no signs of sepsis was given a one-time dose of  fosfomycin over the weekend -Purewick dc'd due to contributing to recurrent UTIs  Right upper extremity thrombophlebitis - [name] -> age indeterminate SVT of right cephalic vein  Neck Contracture/torticollis -- Resolved  Recurrent right knee pain/known ligamentous injury and chronic osteoarthritis -s/p injection of Marcaine and Depomedrol 6/30 -9/5 decreased Celebrex 200 mg to at bedtime only  ESBL positive Enterobacter UTI -Has completed 5 days of meropenem -Contact isolation during hospitalization -continue Oxybutynin suspected bladder spasms  Klebsiella UTI -Resistant to nitrofurantoin and ampicillin -To minimize risk of recurrent UTI have asked that pure wick only be used at bedtime on a 10 PM to 8 AM schedule  Recurrent diarrhea -Resolved  Bacterial Conjunctivitis /periorbital cellulitis -Resolved  Chronic  pansinusitis/recurrent fevers 2/2 aspiration PNA;History of Serratia Pneumonia  Hospital Acquired Pneumonia:  -Completed several courses of antibiotics -Follow-up imaging including sinus CT consistent with chronic but stable and resolving sinusitis   Anemia -Stable  HLD -Continue Lipitor      Data Reviewed: Basic Metabolic Panel: Recent Labs  Lab 03/18/21 0240  NA 139  K 3.5  CL 101  CO2 27  GLUCOSE 131*  BUN 18  CREATININE 0.51  CALCIUM 9.3      Liver Function Tests: Recent Labs  Lab 03/18/21 0240  AST 28  ALT 50*  ALKPHOS 80  BILITOT 0.3  PROT 6.8  ALBUMIN 3.4*       CBC: Recent Labs  Lab 03/18/21 0240 03/19/21 0829 03/20/21 1732  WBC 5.0 4.9 6.6  NEUTROABS 2.8 2.5 3.8  HGB 11.0* 10.7* 10.6*  HCT 34.0* 33.6* 33.0*  MCV 77.6* 77.6* 77.8*  PLT 238 263 251       CBG: Recent Labs  Lab 03/21/21 0202 03/21/21 0618 03/21/21 1807 03/22/21 0610 03/23/21 0634  GLUCAP 128* 144* 102* 118* 115*     Studies: No results found.   Scheduled Meds:  acetaminophen  650 mg Per Tube Q6H   amLODipine  10 mg Per Tube Daily   atorvastatin  40 mg Per Tube Daily   baclofen  5 mg Per Tube QHS   carvedilol  25 mg Per Tube BID WC   celecoxib  200 mg Per Tube Daily   chlorhexidine gluconate (MEDLINE KIT)  15 mL Mouth Rinse BID   Chlorhexidine Gluconate Cloth  6 each Topical Daily   ciprofloxacin  500 mg Per Tube BID   diclofenac Sodium  4 g Topical QID   doxazosin  2 mg Per Tube Daily   feeding supplement  237 mL Oral TID BM   feeding supplement (JEVITY 1.5 CAL/FIBER)  750 mL Per Tube Q24H   feeding supplement (PROSource TF)  45 mL Per Tube Daily   free water  200 mL Per Tube Q4H   hydrALAZINE  37.5 mg Per Tube Q8H   mouth rinse  15 mL Mouth Rinse 5 X Daily   modafinil  200 mg Per Tube Daily   multivitamin with minerals  1 tablet Per Tube Daily   pantoprazole sodium  40 mg Per Tube Daily   Continuous Infusions:  sodium chloride Stopped  (10/26/20 1445)    Principal Problem:   ICH (intracerebral hemorrhage) ([place]) Active Problems:   Acute hypoxemic respiratory failure (HCC)   Tracheostomy dependent (HCC)   Palliative care by specialist   Muscle hypertonicity   Oropharyngeal dysphagia   Poor prognosis   Aspiration pneumonia of right lower lobe (HCC)   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  Palliative care.   PCCM continues to follow.  No decannulation.   Procedures: Tracheostomy PEG tube EEG Echocardiogram  Antibiotics: Vancomycin 2/12-2/15 Maxipime 2/12-2/15 Ancef 2/19-2/27 Vancomycin 2/20 x 1 dose Maxipime 2/27- 3/5 Vancomycin 3/1-3/3 Ancef 3/6-3/14 Cipro 3/20 4-3/30 Unasyn 3/30-4/4 Rocephin 4/11-4/12 Meropenem 4/12-4/17 Cipro 9/2 >>   Time spent: 15 minutes    Erin Hearing ANP  [facility] 7 am - 330 pm/M-F for direct patient care and secure chat Please refer to [place] for contact info [phone]  Days

## 2021-03-24 LAB — CULTURE, BLOOD (ROUTINE X 2)
Culture: NO GROWTH
Culture: NO GROWTH
Special Requests: ADEQUATE
Special Requests: ADEQUATE

## 2021-03-24 LAB — GLUCOSE, CAPILLARY
Glucose-Capillary: 133 mg/dL — ABNORMAL HIGH (ref 70–99)
Glucose-Capillary: 147 mg/dL — ABNORMAL HIGH (ref 70–99)

## 2021-03-24 NOTE — Plan of Care (Signed)
  Problem: Coping: Goal: Will identify appropriate support needs 03/24/2021 1855 by Drue Dun, RN Outcome: Progressing 03/24/2021 1855 by Drue Dun, RN Outcome: Progressing   Problem: Health Behavior/Discharge Planning: Goal: Ability to manage health-related needs will improve 03/24/2021 1855 by Drue Dun, RN Outcome: Progressing 03/24/2021 1855 by Drue Dun, RN Outcome: Progressing   Problem: Self-Care: Goal: Verbalization of feelings and concerns over difficulty with self-care will improve 03/24/2021 1855 by Drue Dun, RN Outcome: Progressing 03/24/2021 1855 by Drue Dun, RN Outcome: Progressing Goal: Ability to communicate needs accurately will improve 03/24/2021 1855 by Drue Dun, RN Outcome: Progressing 03/24/2021 1855 by Drue Dun, RN Outcome: Progressing   Problem: Nutrition: Goal: Risk of aspiration will decrease 03/24/2021 1855 by Drue Dun, RN Outcome: Progressing 03/24/2021 1855 by Drue Dun, RN Outcome: Progressing   Problem: Intracerebral Hemorrhage Tissue Perfusion: Goal: Complications of Intracerebral Hemorrhage will be minimized 03/24/2021 1855 by Drue Dun, RN Outcome: Progressing 03/24/2021 1855 by Drue Dun, RN Outcome: Progressing   Problem: Education: Goal: Knowledge of General Education information will improve Description: Including pain rating scale, medication(s)/side effects and non-pharmacologic comfort measures 03/24/2021 1855 by Drue Dun, RN Outcome: Progressing 03/24/2021 1855 by Drue Dun, RN Outcome: Progressing   Problem: Health Behavior/Discharge Planning: Goal: Ability to manage health-related needs will improve 03/24/2021 1855 by Drue Dun, RN Outcome: Progressing 03/24/2021 1855 by Drue Dun, RN Outcome: Progressing   Problem: Clinical Measurements: Goal: Ability to maintain clinical measurements within normal limits will improve 03/24/2021 1855  by Drue Dun, RN Outcome: Progressing 03/24/2021 1855 by Drue Dun, RN Outcome: Progressing Goal: Will remain free from infection 03/24/2021 1855 by Drue Dun, RN Outcome: Progressing 03/24/2021 1855 by Drue Dun, RN Outcome: Progressing Goal: Diagnostic test results will improve 03/24/2021 1855 by Drue Dun, RN Outcome: Progressing 03/24/2021 1855 by Drue Dun, RN Outcome: Progressing Goal: Respiratory complications will improve 03/24/2021 1855 by Drue Dun, RN Outcome: Progressing 03/24/2021 1855 by Drue Dun, RN Outcome: Progressing Goal: Cardiovascular complication will be avoided 03/24/2021 1855 by Drue Dun, RN Outcome: Progressing 03/24/2021 1855 by Drue Dun, RN Outcome: Progressing   Problem: Coping: Goal: Level of anxiety will decrease 03/24/2021 1855 by Drue Dun, RN Outcome: Progressing 03/24/2021 1855 by Drue Dun, RN Outcome: Progressing   Problem: Elimination: Goal: Will not experience complications related to bowel motility 03/24/2021 1855 by Drue Dun, RN Outcome: Progressing 03/24/2021 1855 by Drue Dun, RN Outcome: Progressing Goal: Will not experience complications related to urinary retention 03/24/2021 1855 by Drue Dun, RN Outcome: Progressing 03/24/2021 1855 by Drue Dun, RN Outcome: Progressing   Problem: Pain Managment: Goal: General experience of comfort will improve 03/24/2021 1855 by Drue Dun, RN Outcome: Progressing 03/24/2021 1855 by Drue Dun, RN Outcome: Progressing   Problem: Skin Integrity: Goal: Risk for impaired skin integrity will decrease 03/24/2021 1855 by Drue Dun, RN Outcome: Progressing 03/24/2021 1855 by Drue Dun, RN Outcome: Progressing   Problem: Intracerebral Hemorrhage Tissue Perfusion: Goal: Complications of Intracerebral Hemorrhage will be minimized 03/24/2021 1855 by Drue Dun, RN Outcome:  Progressing 03/24/2021 1855 by Drue Dun, RN Outcome: Progressing   Problem: Ischemic Stroke/TIA Tissue Perfusion: Goal: Complications of ischemic stroke/TIA will be minimized 03/24/2021 1855 by Drue Dun, RN Outcome: Progressing 03/24/2021 1855 by Drue Dun, RN Outcome: Progressing

## 2021-03-24 NOTE — Progress Notes (Signed)
SLP Cancellation Note  Patient Details Name: Charlene [name] MRN: [id number] DOB: February 10, 1966   Cancelled treatment:       Reason Eval/Treat Not Completed: Fatigue/lethargy limiting ability to participate- pt very sleepy this afternoon, nodded yes that her mother was in the hospital today, then fell back to sleep. Will continue efforts.  Yarielys Beed L. Samson [name], MA CCC/SLP Acute Rehabilitation Services Office number [phone] Pager [phone]    [name] [name] 03/24/2021, 2:19 PM

## 2021-03-24 NOTE — Progress Notes (Signed)
[facility] PROGRESS NOTE  Charlene [name] VCB:[id number] DOB: 1965-11-24 DOA: 08/26/2020 PCP: Patient, [name] (Inactive)    October 08, 2020     10/16/2020                        10/16/2020     10/18/2020                        10/20/2020      12/30/2020                   12/30/2020     01/04/2021                   02/24/2021 Viv really needs a teddy bear!   Status: Remains inpatient appropriate because:Altered mental status and Unsafe d/c plan: Requirement to discharge to trach capable SNF   Dispo: The patient is from: Home              Anticipated d/c is to: SNF trach capable-clinicals resubmitted to multiple facilities as of 8/31.  Unable to decannulate 2/2 and lack of appropriate voluntary and involuntary cough response              Patient currently is medically stable to d/c.   Difficult to place patient Yes         Barriers to DC: Continued requirement for right hand mitt to prevent self decannulation.  This is considered a restraint by SNFs. patient is not doing this by accident she is purposefully D cannulating herself.   Level of care: Telemetry Medical  Code Status: Full Family Communication: husband Charlene [name] [phone]) is her POA and legal contact.  Charlene [name] by phone 8/19.  Mother at bedside 8/29-Dtr Charlene [name] 8/19 DVT prophylaxis: Chronically bedbound so Lovenox discontinued   Vaccination status: Unvaccinated-husband still undecided.  Micro Data:  2/9 COVID + Influenza negative 2/10 MRSA PCR negative 2/12 BCx 2 > no growth  2/19 BC 1/2 +Stap EPI, 1/2 no growth  10/07/2020 sputum positive for Serratia marcescens 4/8 Enterobacter cloaca + urine culture MDR sensitive only to imipenem and gentamicin 5/20 urine positive for Klebsiella resistant to ampicillin and Macrodantin 6/2 urine positive for Klebsiella resistant to ampicillin, cefazolin and Macrodantin 7/4 urine positive for ESBL Klebsiella resistant to ampicillin, cefazolin and Macrodantin 7/28 urine positive for  Enterobacter resistant to cefazolin, cefepime and Septra 9/2 urine positive for pansensitive Pseudomonas     Antimicrobials:  Cefepime 2/12 > 2/15 Vanc 2/12 > 2/15 10/08/2020 ciprofloxacin>>3/30 Unasyn 3/30 >> 4/02 Rocephin 4/11 > 4/12 Meropenem 4/12>4/17 Meropenem 5/20 through 5/22 Ceftriaxone 5/22 through 5/26 Meropenem 6/3 Ceftriaxone 6/4 through 6/10 Septra 7/4 through 7/7 Ceftriaxone 7/29 was 1 dose followed by 1 dose of fosfomycin Cipro 9/2 >>   HPI: 55 yo F with 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  Subsequently on 2/10, she developed obtundation and respiratory arrest leading to intubation.  She has remained persistently obtunded, now has a trach and a PEG.  Hospital course complicated by persistent fever, Serratia pneumonia and now pre-orbital cellulitis  As of 3/25 with adjustments in hypertonicity medications patient began to improve regarding hypertonicity.  Eventually started on provigil with improvements in alertness.  Evaluated by rehab physician who recommended increasing provigil dose further.  Subsequently patient has become more alert.  Most days she is able to track and with encouragement able to follow simple commands  especially since partial obstruction glasses placed.  See progress note dictated 4/27 for expanded details.   Significant events: 2/09 Admit for ICH 2/10 EVD placed, later intubated due to obtundation and respiratory arrest 2/11 MRI Brain, hypertonic saline d/c'd, EVD not working.  Husband did not consent to PICC  2/13 EVD removed 2/17 [place] discussion with family, PMT 2/24 Bedside trach and PEG 3/1 Transferred to [facility]  Subjective: Sleeping soundly and did not awaken for examination.  Objective: Vitals:   03/24/21 0300 03/24/21 0325  BP: 132/77   Pulse: 82 84  Resp:  17  Temp:    SpO2: 99% 100%   Filed Weights   03/16/21 0500 03/17/21 0500 03/23/21  0346  Weight: 65.1 kg 61.5 kg 67.5 kg    Exam:       Gen: Sleeping soundly Respiratory: #4.0 cuffless trach, trach collar with FiO2 21% at 5L; lung sounds remain clear to auscultation without increased work of breathing.   Cardiovascular: S1-S2, normotensive, no peripheral edema, adequate capillary refill Abdomen:  PEG /nocturnal tube feedings. LBM 9/04, nondistended, normoactive bowel sounds. Neurological: Sleeping but at baseline CN 2 -12 intact. Spontaneous purposeful movement of right upper extremity/strength 4/5.   RLE strength 2/5 with purposeful movement.  Persistent left-side neglect.   Psychiatric: Sleeping  Assessment/Plan: Acute problems: Multiple acute strokes  Right Thalamus ICH with IVH  Obstructive Hydrocephalus/left-sided neglect /Non TBI related hypertonicity -Continue HS baclofen 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 although when patient's mother wrote to the patient "which ear do you hear out of best" the patient wrote back "both".  At this point I do not suspect any acute hearing loss -Patient has been writing her name and responding to written questions by writing back.  Tracheostomy dependent -PCCM/trach team following closely regarding candidacy for decannulation. On 8/16 Dr. Dewald/PCCM discussed with patient's husband Charlene [name] regarding risks and benefits for decannulation and both Charlene [name] and family agree-reevaluated again on 9/6 with similar recommendations with family agreeing that risk outweighs benefit -Not yet a candidate for decannulation given lack of significant improvement thus far in terms of consistent cough strength and ability for her to cognitively understand and act on instructions to cough plus signs of impulsiveness and ongoing aspiration -Do not think she has enough memory capacity to remember not to inadvertently pull out her trach.  Continue mitten for safety reasons and not as a restraint  Microcytic anemia with known previous iron  deficiency Continues with low MCV and hemoglobin 11 Repeat anemia panel September 2022: Iron normal at 63.  B12 1243 normal folate  Fever 2/2 Pseudomonas UTI TM 100.7  Urine culture positive for pansensitive Pseudomonas Continue Cipro D # 6/7 days  Constipation -Continue prn MiraLAX   Hypertensive emergency/Malignant HTN -Continue Norvasc, beta-blocker and hydralazine.  Dysphagia -Continue dysphagia 1 with thin liquids -Inconsistent oral intake therefore -continue nocturnal tube feedings  History of urinary retention -Continue Cardura    Other problems: Enterobacter cystitis -Urine culture with Enterobacter resistant to cefazolin, cefepime and Septra. -Previously given 2 days of IV Rocephin and since no signs of sepsis was given a one-time dose of fosfomycin over the weekend -Purewick dc'd due to contributing to recurrent UTIs  Right upper extremity thrombophlebitis - [name] -> age indeterminate SVT of right cephalic vein  Neck Contracture/torticollis -- Resolved  Recurrent right knee pain/known ligamentous injury and chronic osteoarthritis -s/p injection of Marcaine and Depomedrol 6/30 -9/5 decreased Celebrex 200 mg to at bedtime only  ESBL positive Enterobacter UTI -Has completed 5 days of  meropenem -Contact isolation during hospitalization -continue Oxybutynin suspected bladder spasms  Klebsiella UTI -Resistant to nitrofurantoin and ampicillin -To minimize risk of recurrent UTI have asked that pure wick only be used at bedtime on a 10 PM to 8 AM schedule  Recurrent diarrhea -Resolved  Bacterial Conjunctivitis /periorbital cellulitis -Resolved  Chronic pansinusitis/recurrent fevers 2/2 aspiration PNA;History of Serratia Pneumonia  Hospital Acquired Pneumonia:  -Completed several courses of antibiotics -Follow-up imaging including sinus CT consistent with chronic but stable and resolving sinusitis   Anemia -Stable  HLD -Continue Lipitor      Data  Reviewed: Basic Metabolic Panel: Recent Labs  Lab 03/18/21 0240  NA 139  K 3.5  CL 101  CO2 27  GLUCOSE 131*  BUN 18  CREATININE 0.51  CALCIUM 9.3      Liver Function Tests: Recent Labs  Lab 03/18/21 0240  AST 28  ALT 50*  ALKPHOS 80  BILITOT 0.3  PROT 6.8  ALBUMIN 3.4*       CBC: Recent Labs  Lab 03/18/21 0240 03/19/21 0829 03/20/21 1732  WBC 5.0 4.9 6.6  NEUTROABS 2.8 2.5 3.8  HGB 11.0* 10.7* 10.6*  HCT 34.0* 33.6* 33.0*  MCV 77.6* 77.6* 77.8*  PLT 238 263 251       CBG: Recent Labs  Lab 03/21/21 0202 03/21/21 0618 03/21/21 1807 03/22/21 0610 03/23/21 0634  GLUCAP 128* 144* 102* 118* 115*     Studies: No results found.   Scheduled Meds:  acetaminophen  650 mg Per Tube Q6H   amLODipine  10 mg Per Tube Daily   atorvastatin  40 mg Per Tube Daily   baclofen  5 mg Per Tube QHS   carvedilol  25 mg Per Tube BID WC   celecoxib  200 mg Per Tube Daily   chlorhexidine gluconate (MEDLINE KIT)  15 mL Mouth Rinse BID   Chlorhexidine Gluconate Cloth  6 each Topical Daily   ciprofloxacin  500 mg Per Tube BID   diclofenac Sodium  4 g Topical QID   doxazosin  2 mg Per Tube Daily   feeding supplement  237 mL Oral TID BM   feeding supplement (JEVITY 1.5 CAL/FIBER)  750 mL Per Tube Q24H   feeding supplement (PROSource TF)  45 mL Per Tube Daily   free water  200 mL Per Tube Q4H   hydrALAZINE  37.5 mg Per Tube Q8H   mouth rinse  15 mL Mouth Rinse 5 X Daily   modafinil  200 mg Per Tube Daily   multivitamin with minerals  1 tablet Per Tube Daily   pantoprazole sodium  40 mg Per Tube Daily   Continuous Infusions:  sodium chloride Stopped (10/26/20 1445)    Principal Problem:   ICH (intracerebral hemorrhage) ([place]) Active Problems:   Acute hypoxemic respiratory failure (HCC)   Tracheostomy dependent ([place])   Palliative care by specialist   Muscle hypertonicity   Oropharyngeal dysphagia   Poor prognosis   Aspiration pneumonia of right lower lobe  (HCC)   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  Palliative care.   PCCM continues to follow.  No decannulation.   Procedures: Tracheostomy PEG tube EEG Echocardiogram  Antibiotics: Vancomycin 2/12-2/15 Maxipime 2/12-2/15 Ancef 2/19-2/27 Vancomycin 2/20 x 1 dose Maxipime  2/27- 3/5 Vancomycin 3/1-3/3 Ancef 3/6-3/14 Cipro 3/20 4-3/30 Unasyn 3/30-4/4 Rocephin 4/11-4/12 Meropenem 4/12-4/17 Cipro 9/2 >>   Time spent: 15 minutes    Erin Hearing ANP  [facility] 7 am - 330 pm/M-F for direct patient care and secure chat Please refer to [place] for contact info [phone]  Days

## 2021-03-25 ENCOUNTER — Inpatient Hospital Stay (HOSPITAL_COMMUNITY): Payer: Medicaid Other

## 2021-03-25 LAB — CBC WITH DIFFERENTIAL/PLATELET
Abs Immature Granulocytes: 0.01 10*3/uL (ref 0.00–0.07)
Basophils Absolute: 0 10*3/uL (ref 0.0–0.1)
Basophils Relative: 1 %
Eosinophils Absolute: 0.1 10*3/uL (ref 0.0–0.5)
Eosinophils Relative: 1 %
HCT: 32.8 % — ABNORMAL LOW (ref 36.0–46.0)
Hemoglobin: 10.3 g/dL — ABNORMAL LOW (ref 12.0–15.0)
Immature Granulocytes: 0 %
Lymphocytes Relative: 33 %
Lymphs Abs: 1.7 10*3/uL (ref 0.7–4.0)
MCH: 24.6 pg — ABNORMAL LOW (ref 26.0–34.0)
MCHC: 31.4 g/dL (ref 30.0–36.0)
MCV: 78.3 fL — ABNORMAL LOW (ref 80.0–100.0)
Monocytes Absolute: 0.5 10*3/uL (ref 0.1–1.0)
Monocytes Relative: 9 %
Neutro Abs: 2.9 10*3/uL (ref 1.7–7.7)
Neutrophils Relative %: 56 %
Platelets: 250 10*3/uL (ref 150–400)
RBC: 4.19 MIL/uL (ref 3.87–5.11)
RDW: 14.8 % (ref 11.5–15.5)
WBC: 5.1 10*3/uL (ref 4.0–10.5)
nRBC: 0 % (ref 0.0–0.2)

## 2021-03-25 LAB — BASIC METABOLIC PANEL
Anion gap: 7 (ref 5–15)
BUN: 21 mg/dL — ABNORMAL HIGH (ref 6–20)
CO2: 30 mmol/L (ref 22–32)
Calcium: 9 mg/dL (ref 8.9–10.3)
Chloride: 101 mmol/L (ref 98–111)
Creatinine, Ser: 0.59 mg/dL (ref 0.44–1.00)
GFR, Estimated: >60 mL/min (ref >60)
Glucose, Bld: 134 mg/dL — ABNORMAL HIGH (ref 70–99)
Potassium: 3.7 mmol/L (ref 3.5–5.1)
Sodium: 138 mmol/L (ref 135–145)

## 2021-03-25 LAB — BLOOD GAS, ARTERIAL
Acid-Base Excess: 3.1 mmol/L — ABNORMAL HIGH (ref 0.0–2.0)
Bicarbonate: 27.3 mmol/L (ref 20.0–28.0)
FIO2: 21
O2 Saturation: 96.1 %
Patient temperature: 36.9
pCO2 arterial: 43.4 mmHg (ref 32.0–48.0)
pH, Arterial: 7.415 (ref 7.350–7.450)
pO2, Arterial: 81.4 mmHg — ABNORMAL LOW (ref 83.0–108.0)

## 2021-03-25 LAB — GLUCOSE, CAPILLARY
Glucose-Capillary: 135 mg/dL — ABNORMAL HIGH (ref 70–99)
Glucose-Capillary: 139 mg/dL — ABNORMAL HIGH (ref 70–99)

## 2021-03-25 LAB — AMMONIA: Ammonia: 37 umol/L — ABNORMAL HIGH (ref 9–35)

## 2021-03-25 NOTE — Progress Notes (Signed)
Nutrition Follow-up  DOCUMENTATION CODES:  Not applicable  INTERVENTION:  -Continue to encourage PO intake & assist as needed -Continue Ensure Enlive po TID -Continue MVI with minerals daily  Continue nocturnal TF via PEG: -Jevity 1.5 @ 73ml/hr for 10 hours from 2000-0600 ( total) -67ml PROSource TF daily Provides 1165 kcals, 58 grams protein, free water -- meets ~73% minimum estimated calorie and protein needs -Free water flushes per MD, currently Q4H (provides total free water w/ TF and flushes)  NUTRITION DIAGNOSIS:  Inadequate oral intake related to inability to eat as evidenced by NPO status. 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  Subsequently on 2/10, pt developed obtundation and respiratory arrest leading to intubation. Pt is now s/p trach/PEG placement. Hospital course complicated by Serratia pneumonia, ESBL Enterobacter UTI and pre-orbital cellulitis.  2/10 s/p EVD placement and intubation for respiratory arrest 2/13 EVD removed 2/17 palliative care consult; family requesting tx to [facility] 2/24 s/p trach and PEG 3/1 tx to [facility] 3/11 trach changed to shiley #6 cuffless 4/08 pt w/ sepsis 2/2 UTI 4/18 28% ATC/5L 5/2 CIR reevaluated pt and determined she has not made enough improvement for admit; recommended SNF 5/8 trach changed to shiley #4 cuffless 5/23 CCM evaluated and determined pt stilll unable to decannulate due to variable mental status 6/3 trach exchanged, still shiley #4  cuffless 6/30 s/p R knee aspiration and injection 7/3 trach exchanged, still shiley #4 cuffless 7/8 diet advanced to dysphagia 1 with thin liquids 7/17 trach exchanged, still shiley #4 cuffless 7/21 trach replaced after pt self-decannulated  8/01 tolerating PMV, minimal secretions 8/02 PEG replaced after pt removed 8/14 trach exchanged, still shiley #4 cuffless 8/23 trach reinserted (still shiley #4 cuffless) after pt decannulated herself  Per NP, pt's current barrier to discharge is the continued requirement for R hand mitt to prevent self-decannulation as it is considered to be a restraint by SNF. Pt now awake and alert and responding appropriately.    Pt's intake had appeared to be improving at time of last RD assessment; however, has overall continued to be inadequate with average intake ~25%. Last 8 meal completions charted as 10-40% (23% average meal intake). Per NP, pt has UTI and intake has previously decreased when pt had UTI. Will continue to monitor intake and adjust TF as appropriate.  Admission weight: 61.2 kg Current weight: 67.5 kg  Per RN edema assessment, non-pitting edema noted to all extremities.   UOP: 5x unmeasured occurrences documented x24 hours  Medications: Ensure Enlive/Plus TID, mvi with minerals, protonix Labs reviewed. CBGs 133-147 x 24 hours  Current TF: Jevity 1.5 @ 80ml/hr administered over 10 hours (2000-0600) to provide TF per night, 60ml Prosource TF daily, free water Q4H  Diet Order:   Diet Order             DIET - DYS 1 Room service appropriate? No; Fluid consistency: Thin  Diet effective now                  EDUCATION NEEDS:  No education  needs have been identified at this time  Skin:  Skin Assessment: Reviewed RN Assessment  Last BM:  9/7  Height:  Ht Readings from Last 1 Encounters:  10/18/20 5\' 5"  (1.651 m)   Weight:  Wt Readings from Last 1 Encounters:  03/23/21 67.5 kg   Ideal Body Weight:  56.8 kg  BMI:   Body mass index is 24.76 kg/m.  Estimated Nutritional Needs:  Kcal:  1600-1800 Protein:  80-90 grams Fluid:  >1.6L/d   05/23/21, MS, RD, LDN (she/her/hers) RD pager number and weekend/on-call pager number located in Amion.

## 2021-03-25 NOTE — Progress Notes (Signed)
Physical Therapy Treatment Patient Details Name: Charlene [name] MRN: [id number] DOB: Aug 21, 1965 Today's Date: 03/25/2021    History of Present Illness Pt is 55 y/o female [facility] on 08/26/20 with L sided weakness and headaches. 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 s/p Right frontal ventricular catheter placement on 2/10, stopped functioning on 2/11.  MRI 2/11 shows right greater than left mesial temporal lobes / parahippocampal cortex, bilateral basal ganglia, and possibly the upper pons that are concerning for acute infarcts, most likely secondary to mass effect from the hemorrhage. ETT 2/10-2/24.  Pt with trach and PEG on 09/10/20. Hospital course complicated by persistent fever, Serratia pneumonia and now pre-orbital cellulitis.   Pt self decannulated 02/04/21, trach re-inserted by RT. Pt dislodged PEG, guided replacement 8/2.  PMH includes anxiety, HTN.    PT Comments    I woke pt from sleeping and she was initially a bit resistive to coming EOB, but I repositioned and preformed her 3 extremity ROM and she came back up with no resistance to the EOB.  Pt's mom came in after I was finished and updated me on her writing and visit with her sister this AM.  PT will continue to follow acutely for safe mobility progression.  Goals Updated, but remain appropriate.  Follow Up Recommendations  SNF     Equipment Recommendations  Wheelchair (measurements PT);Wheelchair cushion (measurements PT);Hospital bed;Other (comment) (18x18 tilt in space WC, hoyer lift)    Recommendations for Other Services       Precautions / Restrictions Precautions Precautions: Fall Precaution Comments: peg and trach, R mitt Required Braces or Orthoses: Other Brace Other Brace: bil. PRAFOs, soft R knee extension splint and soft L elbow extension splint    Mobility  Bed  Mobility Overal bed mobility: Needs Assistance Bed Mobility: Rolling;Sidelying to Sit;Sit to Sidelying Rolling: Max assist Sidelying to sit: Max assist   Sit to supine: Max assist   General bed mobility comments: Attempted twice because first try was met with posterior pushing and resistance as if she did not want to sit up on the side of the bed, so I repositioned with max assist back in supine, preformed 3 extremity ROM and tried again, she continued to furrow her brow, but came up without resistance the second time.    Transfers                    Ambulation/Gait                 Stairs             Wheelchair Mobility    Modified Rankin (Stroke Patients Only) Modified Rankin (Stroke Patients Only) Pre-Morbid Rankin Score: No symptoms Modified Rankin: Severe disability     Balance Overall balance assessment: Needs assistance Sitting-balance support: Feet unsupported;Single extremity supported Sitting balance-Leahy Scale: Poor Sitting balance - Comments: not pushing as much today, but on a different bed with much more complaint mattress, feet far from the floor.  Max assist for sitting balance, worked on seated perturbations, sat up for ~20 mins. Postural control: Posterior lean                                  Cognition Arousal/Alertness: Lethargic (initially, woke up with mobility) Behavior During Therapy: Flat affect  Overall Cognitive Status: Impaired/Different from baseline Area of Impairment: Attention;Following commands                   Current Attention Level: Sustained   Following Commands: Follows one step commands inconsistently;Follows one step commands with increased time Safety/Judgement: Decreased awareness of safety;Decreased awareness of deficits Awareness: Intellectual   General Comments: Mother (who I saw later, after the session) reports she has communicated well with writing lately.  Even writing a  sentance (she showed me).  indicating hearing loss      Exercises General Exercises - Upper Extremity Shoulder Flexion: PROM;Left;10 reps Shoulder ABduction: PROM;Left;10 reps Elbow Extension: PROM;Left;10 reps General Exercises - Lower Extremity Ankle Circles/Pumps: AAROM;Both;10 reps Heel Slides: AAROM;Both;10 reps Other Exercises Other Exercises: removed L knee soft splint and R elbow soft splint to relieve pressure.    General Comments        Pertinent Vitals/Pain Pain Assessment: Faces Faces Pain Scale: Hurts even more Pain Location: bil knees Pain Descriptors / Indicators: Grimacing;Guarding Pain Intervention(s): Monitored during session;Limited activity within patient's tolerance;Repositioned    Home Living                      Prior Function            PT Goals (current goals can now be found in the care plan section) Acute Rehab PT Goals Patient Stated Goal: unable to state PT Goal Formulation: With family Time For Goal Achievement: 04/08/21 Potential to Achieve Goals: Fair Progress towards PT goals: Progressing toward goals    Frequency    Min 1X/week      PT Plan Current plan remains appropriate    Co-evaluation              AM-PAC PT "6 Clicks" Mobility   Outcome Measure  Help needed turning from your back to your side while in a flat bed without using bedrails?: Total Help needed moving from lying on your back to sitting on the side of a flat bed without using bedrails?: Total Help needed moving to and from a bed to a chair (including a wheelchair)?: Total Help needed standing up from a chair using your arms (e.g., wheelchair or bedside chair)?: Total Help needed to walk in hospital room?: Total Help needed climbing 3-5 steps with a railing? : Total 6 Click Score: 6    End of Session   Activity Tolerance: Patient tolerated treatment well Patient left: in bed;with call bell/phone within reach   PT Visit Diagnosis:  Hemiplegia and hemiparesis;Other symptoms and signs involving the nervous system (R29.898);Other abnormalities of gait and mobility (R26.89) Hemiplegia - Right/Left: Left Hemiplegia - dominant/non-dominant: Non-dominant Hemiplegia - caused by: Cerebral infarction;Nontraumatic intracerebral hemorrhage     Time: 7340-3709 PT Time Calculation (min) (ACUTE ONLY): 31 min  Charges:  $Therapeutic Activity: 23-37 mins                    Verdene Lennert, PT, DPT  Acute Rehabilitation Ortho Tech Supervisor [phone] pager [phone]) [phone] office

## 2021-03-25 NOTE — Plan of Care (Signed)
  Problem: Coping: Goal: Will identify appropriate support needs Outcome: Progressing   Problem: Health Behavior/Discharge Planning: Goal: Ability to manage health-related needs will improve Outcome: Progressing   Problem: Self-Care: Goal: Verbalization of feelings and concerns over difficulty with self-care will improve Outcome: Progressing Goal: Ability to communicate needs accurately will improve Outcome: Progressing   Problem: Nutrition: Goal: Risk of aspiration will decrease Outcome: Progressing   Problem: Intracerebral Hemorrhage Tissue Perfusion: Goal: Complications of Intracerebral Hemorrhage will be minimized Outcome: Progressing   Problem: Education: Goal: Knowledge of General Education information will improve Description: Including pain rating scale, medication(s)/side effects and non-pharmacologic comfort measures Outcome: Progressing   Problem: Health Behavior/Discharge Planning: Goal: Ability to manage health-related needs will improve Outcome: Progressing   Problem: Clinical Measurements: Goal: Ability to maintain clinical measurements within normal limits will improve Outcome: Progressing Goal: Will remain free from infection Outcome: Progressing Goal: Diagnostic test results will improve Outcome: Progressing Goal: Respiratory complications will improve Outcome: Progressing Goal: Cardiovascular complication will be avoided Outcome: Progressing   Problem: Coping: Goal: Level of anxiety will decrease Outcome: Progressing   Problem: Elimination: Goal: Will not experience complications related to bowel motility Outcome: Progressing Goal: Will not experience complications related to urinary retention Outcome: Progressing   Problem: Pain Managment: Goal: General experience of comfort will improve Outcome: Progressing   Problem: Skin Integrity: Goal: Risk for impaired skin integrity will decrease Outcome: Progressing   Problem: Intracerebral  Hemorrhage Tissue Perfusion: Goal: Complications of Intracerebral Hemorrhage will be minimized Outcome: Progressing   Problem: Ischemic Stroke/TIA Tissue Perfusion: Goal: Complications of ischemic stroke/TIA will be minimized Outcome: Progressing   

## 2021-03-25 NOTE — Progress Notes (Signed)
[facility] PROGRESS NOTE  Charlene [name] VCB:[id number] DOB: 1965-11-24 DOA: 08/26/2020 PCP: Patient, [name] (Inactive)    October 08, 2020     10/16/2020                        10/16/2020     10/18/2020                        10/20/2020      12/30/2020                   12/30/2020     01/04/2021                   02/24/2021 Viv really needs a teddy bear!   Status: Remains inpatient appropriate because:Altered mental status and Unsafe d/c plan: Requirement to discharge to trach capable SNF   Dispo: The patient is from: Home              Anticipated d/c is to: SNF trach capable-clinicals resubmitted to multiple facilities as of 8/31.  Unable to decannulate 2/2 and lack of appropriate voluntary and involuntary cough response              Patient currently is medically stable to d/c.   Difficult to place patient Yes         Barriers to DC: Continued requirement for right hand mitt to prevent self decannulation.  This is considered a restraint by SNFs. patient is not doing this by accident she is purposefully D cannulating herself.   Level of care: Telemetry Medical  Code Status: Full Family Communication: [name] [phone]) is her POA and legal contact.  [name] by phone 8/19.  Mother at bedside 8/29-Dtr [name] 8/19 DVT prophylaxis: Chronically bedbound so Lovenox discontinued   Vaccination status: Unvaccinated-husband still undecided.  Micro Data:  2/9 COVID + Influenza negative 2/10 MRSA PCR negative 2/12 BCx 2 > no growth  2/19 BC 1/2 +Stap EPI, 1/2 no growth  10/07/2020 sputum positive for Serratia marcescens 4/8 Enterobacter cloaca + urine culture MDR sensitive only to imipenem and gentamicin 5/20 urine positive for Klebsiella resistant to ampicillin and Macrodantin 6/2 urine positive for Klebsiella resistant to ampicillin, cefazolin and Macrodantin 7/4 urine positive for ESBL Klebsiella resistant to ampicillin, cefazolin and Macrodantin 7/28 urine positive for  Enterobacter resistant to cefazolin, cefepime and Septra 9/2 urine positive for pansensitive Pseudomonas     Antimicrobials:  Cefepime 2/12 > 2/15 Vanc 2/12 > 2/15 10/08/2020 ciprofloxacin>>3/30 Unasyn 3/30 >> 4/02 Rocephin 4/11 > 4/12 Meropenem 4/12>4/17 Meropenem 5/20 through 5/22 Ceftriaxone 5/22 through 5/26 Meropenem 6/3 Ceftriaxone 6/4 through 6/10 Septra 7/4 through 7/7 Ceftriaxone 7/29 was 1 dose followed by 1 dose of fosfomycin Cipro 9/2 >>   HPI: 55 yo F with 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  Subsequently on 2/10, she developed obtundation and respiratory arrest leading to intubation.  She has remained persistently obtunded, now has a trach and a PEG.  Hospital course complicated by persistent fever, Serratia pneumonia and now pre-orbital cellulitis  As of 3/25 with adjustments in hypertonicity medications patient began to improve regarding hypertonicity.  Eventually started on provigil with improvements in alertness.  Evaluated by rehab physician who recommended increasing provigil dose further.  Subsequently patient has become more alert.  Most days she is able to track and with encouragement able to follow simple commands  especially since partial obstruction glasses placed.  See progress note dictated 4/27 for expanded details.   Significant events: 2/09 Admit for ICH 2/10 EVD placed, later intubated due to obtundation and respiratory arrest 2/11 MRI Brain, hypertonic saline d/c'd, EVD not working.  Husband did not consent to PICC  2/13 EVD removed 2/17 [place] discussion with family, PMT 2/24 Bedside trach and PEG 3/1 Transferred to [facility]  Subjective: Patient awake and alert today.  Noting that yesterday was unable to adequately participate with SLP due to increasing lethargy.  Mother at bedside.  Reporting that upon entry to room patient's trach dressing was loose and trach tube  itself appeared to be positioned to the left and not an appropriate placement.  He was contacted by staff and has readjusted trach and ties  Objective: Vitals:   03/25/21 0009 03/25/21 0530  BP: 121/64   Pulse: 93 92  Resp: 17 16  Temp: 98.4 F (36.9 C)   SpO2: 97% 96%   Filed Weights   03/16/21 0500 03/17/21 0500 03/23/21 0346  Weight: 65.1 kg 61.5 kg 67.5 kg    Exam:       Gen: Awake and alert. Respiratory: #4.0 cuffless trach, trach collar with FiO2 21% at 5L; anterior lung sounds clear.  No increased work of breathing. Cardiovascular: S1-S2, normotensive, no peripheral edema, adequate capillary refill Abdomen:  PEG /nocturnal tube feedings. LBM 9/07, nondistended, normoactive bowel sounds. Neurological: CN 2 -12 intact. Spontaneous purposeful movement of right upper extremity/strength 4/5.   RLE strength 2/5 with purposeful movement.  Persistent left-side neglect.   Psychiatric: Awake and alert.  Acting better with her mother today.  No attempts to mouth words or phonate today.  Assessment/Plan: Acute problems: Multiple acute strokes  Right Thalamus ICH with IVH  Obstructive Hydrocephalus/left-sided neglect /Non TBI related hypertonicity -Continue HS baclofen 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 although when patient's mother wrote to the patient "which ear do you hear out of best" the patient wrote back "both".  At this point I do not suspect any acute hearing loss -Patient has been writing her name and responding to written questions by writing back.  Lethargy -Typically this patient becomes more lethargic in the context of acute UTI. -She does have a UTI currently she has been receiving appropriate therapy and is no longer febrile -Monia level only slightly elevated at 37, ABG essentially normal except for slightly low PaO2 of 80. -Chest x-ray unremarkable  Tracheostomy dependent -PCCM/trach team following closely regarding candidacy for decannulation. On 8/16 Dr.  Dewald/PCCM discussed with patient's husband [name] regarding risks and benefits for decannulation and both [name] and family agree-reevaluated again on 9/6 with similar recommendations with family agreeing that risk outweighs benefit -Not yet a candidate for decannulation given lack of significant improvement thus far in terms of consistent cough strength and ability for her to cognitively understand and act on instructions to cough plus signs of impulsiveness and ongoing aspiration -Do not think she has enough memory capacity to remember not to inadvertently pull out her trach.  Continue mitten for safety reasons and not as a restraint  Microcytic anemia with known previous iron deficiency Continues with low MCV and hemoglobin 11 Repeat anemia panel September 2022: Iron normal at 63.  B12 1243 normal folate  Fever 2/2 Pseudomonas UTI TM 100.7  Urine culture positive for pansensitive Pseudomonas Continue Cipro D # 6/7 days  Constipation -Continue prn MiraLAX   Hypertensive emergency/Malignant HTN -Continue Norvasc, beta-blocker and hydralazine.  Dysphagia -Continue dysphagia 1  with thin liquids -Inconsistent oral intake therefore -continue nocturnal tube feedings  History of urinary retention -Continue Cardura    Other problems: Enterobacter cystitis -Urine culture with Enterobacter resistant to cefazolin, cefepime and Septra. -Previously given 2 days of IV Rocephin and since no signs of sepsis was given a one-time dose of fosfomycin over the weekend -Purewick dc'd due to contributing to recurrent UTIs  Right upper extremity thrombophlebitis - [name] -> age indeterminate SVT of right cephalic vein  Neck Contracture/torticollis -- Resolved  Recurrent right knee pain/known ligamentous injury and chronic osteoarthritis -s/p injection of Marcaine and Depomedrol 6/30 -9/5 decreased Celebrex 200 mg to at bedtime only  ESBL positive Enterobacter UTI -Has completed 5 days of  meropenem -Contact isolation during hospitalization -continue Oxybutynin suspected bladder spasms  Klebsiella UTI -Resistant to nitrofurantoin and ampicillin -To minimize risk of recurrent UTI have asked that pure wick only be used at bedtime on a 10 PM to 8 AM schedule  Recurrent diarrhea -Resolved  Bacterial Conjunctivitis /periorbital cellulitis -Resolved  Chronic pansinusitis/recurrent fevers 2/2 aspiration PNA;History of Serratia Pneumonia  Hospital Acquired Pneumonia:  -Completed several courses of antibiotics -Follow-up imaging including sinus CT consistent with chronic but stable and resolving sinusitis   Anemia -Stable  HLD -Continue Lipitor      Data Reviewed: Basic Metabolic Panel: No results for input(s): NA, K, CL, CO2, GLUCOSE, BUN, CREATININE, CALCIUM, MG, PHOS in the last 168 hours.     Liver Function Tests: No results for input(s): AST, ALT, ALKPHOS, BILITOT, PROT, ALBUMIN in the last 168 hours.      CBC: Recent Labs  Lab 03/19/21 0829 03/20/21 1732  WBC 4.9 6.6  NEUTROABS 2.5 3.8  HGB 10.7* 10.6*  HCT 33.6* 33.0*  MCV 77.6* 77.8*  PLT 263 251       CBG: Recent Labs  Lab 03/21/21 1807 03/22/21 0610 03/23/21 0634 03/24/21 0240 03/24/21 0535  GLUCAP 102* 118* 115* 133* 147*     Studies: No results found.   Scheduled Meds:  acetaminophen  650 mg Per Tube Q6H   amLODipine  10 mg Per Tube Daily   atorvastatin  40 mg Per Tube Daily   baclofen  5 mg Per Tube QHS   carvedilol  25 mg Per Tube BID WC   celecoxib  200 mg Per Tube Daily   chlorhexidine gluconate (MEDLINE KIT)  15 mL Mouth Rinse BID   Chlorhexidine Gluconate Cloth  6 each Topical Daily   ciprofloxacin  500 mg Per Tube BID   diclofenac Sodium  4 g Topical QID   doxazosin  2 mg Per Tube Daily   feeding supplement  237 mL Oral TID BM   feeding supplement (JEVITY 1.5 CAL/FIBER)  750 mL Per Tube Q24H   feeding supplement (PROSource TF)  45 mL Per Tube Daily    free water  200 mL Per Tube Q4H   hydrALAZINE  37.5 mg Per Tube Q8H   mouth rinse  15 mL Mouth Rinse 5 X Daily   modafinil  200 mg Per Tube Daily   multivitamin with minerals  1 tablet Per Tube Daily   pantoprazole sodium  40 mg Per Tube Daily   Continuous Infusions:  sodium chloride Stopped (10/26/20 1445)    Principal Problem:   ICH (intracerebral hemorrhage) (HCC) Active Problems:   Acute hypoxemic respiratory failure (HCC)   Tracheostomy dependent (HCC)   Palliative care by specialist   Muscle hypertonicity   Oropharyngeal dysphagia   Poor prognosis  Aspiration pneumonia of right lower lobe (HCC)   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  Palliative care.   PCCM continues to follow.  No decannulation.   Procedures: Tracheostomy PEG tube EEG Echocardiogram  Antibiotics: Vancomycin 2/12-2/15 Maxipime 2/12-2/15 Ancef 2/19-2/27 Vancomycin 2/20 x 1 dose Maxipime 2/27- 3/5 Vancomycin 3/1-3/3 Ancef 3/6-3/14 Cipro 3/20 4-3/30 Unasyn 3/30-4/4 Rocephin 4/11-4/12 Meropenem 4/12-4/17 Cipro 9/2 >>   Time spent: 15 minutes    Erin Hearing ANP  [facility] 7 am - 330 pm/M-F for direct patient care and secure chat Please refer to [place] for contact info 210  Days

## 2021-03-25 NOTE — Plan of Care (Signed)
  Problem: Nutrition: Goal: Risk of aspiration will decrease Outcome: Progressing   Problem: Clinical Measurements: Goal: Will remain free from infection Outcome: Progressing Goal: Diagnostic test results will improve Outcome: Progressing Goal: Respiratory complications will improve Outcome: Progressing   

## 2021-03-26 LAB — GLUCOSE, CAPILLARY
Glucose-Capillary: 125 mg/dL — ABNORMAL HIGH (ref 70–99)
Glucose-Capillary: 140 mg/dL — ABNORMAL HIGH (ref 70–99)

## 2021-03-26 NOTE — Progress Notes (Signed)
[facility] PROGRESS NOTE  Charlene [name] VCB:[id number] DOB: 1965-11-24 DOA: 08/26/2020 PCP: Patient, [name] (Inactive)    October 08, 2020     10/16/2020                        10/16/2020     10/18/2020                        10/20/2020      12/30/2020                   12/30/2020     01/04/2021                   02/24/2021 Viv really needs a teddy bear!   Status: Remains inpatient appropriate because:Altered mental status and Unsafe d/c plan: Requirement to discharge to trach capable SNF   Dispo: The patient is from: Home              Anticipated d/c is to: SNF trach capable-clinicals resubmitted to multiple facilities as of 8/31.  Unable to decannulate 2/2 and lack of appropriate voluntary and involuntary cough response              Patient currently is medically stable to d/c.   Difficult to place patient Yes         Barriers to DC: Continued requirement for right hand mitt to prevent self decannulation.  This is considered a restraint by SNFs. patient is not doing this by accident she is purposefully D cannulating herself.   Level of care: Telemetry Medical  Code Status: Full Family Communication: [name] [phone]) is her POA and legal contact.  [name] by phone 8/19.  Mother at bedside 8/29-Dtr [name] 8/19 DVT prophylaxis: Chronically bedbound so Lovenox discontinued   Vaccination status: Unvaccinated-husband still undecided.  Micro Data:  2/9 COVID + Influenza negative 2/10 MRSA PCR negative 2/12 BCx 2 > no growth  2/19 BC 1/2 +Stap EPI, 1/2 no growth  10/07/2020 sputum positive for Serratia marcescens 4/8 Enterobacter cloaca + urine culture MDR sensitive only to imipenem and gentamicin 5/20 urine positive for Klebsiella resistant to ampicillin and Macrodantin 6/2 urine positive for Klebsiella resistant to ampicillin, cefazolin and Macrodantin 7/4 urine positive for ESBL Klebsiella resistant to ampicillin, cefazolin and Macrodantin 7/28 urine positive for  Enterobacter resistant to cefazolin, cefepime and Septra 9/2 urine positive for pansensitive Pseudomonas     Antimicrobials:  Cefepime 2/12 > 2/15 Vanc 2/12 > 2/15 10/08/2020 ciprofloxacin>>3/30 Unasyn 3/30 >> 4/02 Rocephin 4/11 > 4/12 Meropenem 4/12>4/17 Meropenem 5/20 through 5/22 Ceftriaxone 5/22 through 5/26 Meropenem 6/3 Ceftriaxone 6/4 through 6/10 Septra 7/4 through 7/7 Ceftriaxone 7/29 was 1 dose followed by 1 dose of fosfomycin Cipro 9/2 >>   HPI: 55 yo F with 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  Subsequently on 2/10, she developed obtundation and respiratory arrest leading to intubation.  She has remained persistently obtunded, now has a trach and a PEG.  Hospital course complicated by persistent fever, Serratia pneumonia and now pre-orbital cellulitis  As of 3/25 with adjustments in hypertonicity medications patient began to improve regarding hypertonicity.  Eventually started on provigil with improvements in alertness.  Evaluated by rehab physician who recommended increasing provigil dose further.  Subsequently patient has become more alert.  Most days she is able to track and with encouragement able to follow simple commands  especially since partial obstruction glasses placed.  See progress note dictated 4/27 for expanded details.   Significant events: 2/09 Admit for ICH 2/10 EVD placed, later intubated due to obtundation and respiratory arrest 2/11 MRI Brain, hypertonic saline d/c'd, EVD not working.  Husband did not consent to PICC  2/13 EVD removed 2/17 [place] discussion with family, PMT 2/24 Bedside trach and PEG 3/1 Transferred to [facility]  Subjective: Noted patient crying and confirm she was crying because she was sad and depressed and lonely.  Emotional support given.  Informed patient that I would see if her nurses could get her up in the recliner chair and at least roll her out  of the room into the unit and potentially off the unit and outside.  Staff nurse for patient made aware as well as unit coordinator/supervisor  Objective: Vitals:   03/26/21 0345 03/26/21 0359  BP:  (!) 153/88  Pulse: 82 94  Resp: 16 20  Temp:  98.2 F (36.8 C)  SpO2: 100% 100%   Filed Weights   03/16/21 0500 03/17/21 0500 03/23/21 0346  Weight: 65.1 kg 61.5 kg 67.5 kg    Exam:       Gen: Awake and alert, very sad today.  Crying. Respiratory: #4.0 cuffless trach, trach collar with FiO2 21% at 5L; tracheostomy in midline position, lungs remain clear.  No increased work of breathing Cardiovascular: S1-S2, regular pulse no peripheral edema, adequate capillary refill Abdomen:  PEG /nocturnal tube feedings. LBM 9/08, nondistended, normoactive bowel sounds. Neurological: CN 2 -12 intact. Spontaneous purposeful movement of right upper extremity/strength 4/5.   RLE strength 2/5 with purposeful movement.  Persistent left-side neglect.   Psychiatric: Awake and alert.  Sad and crying today.  Assessment/Plan: Acute problems: Multiple acute strokes  Right Thalamus ICH with IVH  Obstructive Hydrocephalus/left-sided neglect /Non TBI related hypertonicity -Continue HS baclofen 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 although when patient's mother wrote to the patient "which ear do you hear out of best" the patient wrote back "both".  At this point I do not suspect any acute hearing loss -Patient has been writing her name and responding to written questions by writing back.   Tracheostomy dependent -PCCM/trach team following closely regarding candidacy for decannulation. On 8/16 Dr. Dewald/PCCM discussed with patient's husband [name] regarding risks and benefits for decannulation and both [name] and family agree-reevaluated again on 9/6 with similar recommendations with family agreeing that risk outweighs benefit -Not yet a candidate for decannulation given lack of significant improvement thus far in  terms of consistent cough strength and ability for her to cognitively understand and act on instructions to cough plus signs of impulsiveness and ongoing aspiration -Do not think she has enough memory capacity to remember not to inadvertently pull out her trach.  Continue mitten for safety reasons and not as a restraint  Microcytic anemia with known previous iron deficiency Continues with low MCV and hemoglobin 11 Repeat anemia panel September 2022: Iron normal at 63.  B12 1243 normal folate  Fever 2/2 Pseudomonas UTI Urine culture positive for pansensitive Pseudomonas Completed 7 days of Cipro  Constipation -Continue prn MiraLAX   Hypertensive emergency/Malignant HTN -Continue Norvasc, beta-blocker and hydralazine.  Dysphagia -Continue dysphagia 1 with thin liquids -Inconsistent oral intake therefore -continue nocturnal tube feedings  History of urinary retention -Continue Cardura    Other problems: Enterobacter cystitis -Urine culture with Enterobacter resistant to cefazolin, cefepime and Septra. -Previously given 2 days of IV Rocephin and since no signs of sepsis was given  a one-time dose of fosfomycin over the weekend -Purewick dc'd due to contributing to recurrent UTIs  Right upper extremity thrombophlebitis - [name] -> age indeterminate SVT of right cephalic vein  Neck Contracture/torticollis -- Resolved  Recurrent right knee pain/known ligamentous injury and chronic osteoarthritis -s/p injection of Marcaine and Depomedrol 6/30 -9/5 decreased Celebrex 200 mg to at bedtime only  ESBL positive Enterobacter UTI -Has completed 5 days of meropenem -Contact isolation during hospitalization -continue Oxybutynin suspected bladder spasms  Klebsiella UTI -Resistant to nitrofurantoin and ampicillin -To minimize risk of recurrent UTI have asked that pure wick only be used at bedtime on a 10 PM to 8 AM schedule  Recurrent diarrhea -Resolved  Bacterial Conjunctivitis  /periorbital cellulitis -Resolved  Chronic pansinusitis/recurrent fevers 2/2 aspiration PNA;History of Serratia Pneumonia  Hospital Acquired Pneumonia:  -Completed several courses of antibiotics -Follow-up imaging including sinus CT consistent with chronic but stable and resolving sinusitis   Anemia -Stable  HLD -Continue Lipitor      Data Reviewed: Basic Metabolic Panel: Recent Labs  Lab 03/25/21 0813  NA 138  K 3.7  CL 101  CO2 30  GLUCOSE 134*  BUN 21*  CREATININE 0.59  CALCIUM 9.0       Liver Function Tests: No results for input(s): AST, ALT, ALKPHOS, BILITOT, PROT, ALBUMIN in the last 168 hours.      CBC: Recent Labs  Lab 03/19/21 0829 03/20/21 1732 03/25/21 0813  WBC 4.9 6.6 5.1  NEUTROABS 2.5 3.8 2.9  HGB 10.7* 10.6* 10.3*  HCT 33.6* 33.0* 32.8*  MCV 77.6* 77.8* 78.3*  PLT 263 251 250       CBG: Recent Labs  Lab 03/24/21 0240 03/24/21 0535 03/25/21 0225 03/25/21 0625 03/26/21 0149  GLUCAP 133* 147* 135* 139* 125*     Studies: DG CHEST PORT 1 VIEW  Result Date: 03/25/2021 CLINICAL DATA:  Altered mental status EXAM: PORTABLE CHEST 1 VIEW COMPARISON:  September 2nd FINDINGS: Stable positioning of tracheostomy tube. The cardiomediastinal silhouette is within normal limits. No pleural effusion. No pneumothorax. No mass or consolidation. No acute osseous abnormality. IMPRESSION: No acute abnormality in the chest. Electronically Signed   By: Albin Felling M.D.   On: 03/25/2021 11:31     Scheduled Meds:  acetaminophen  650 mg Per Tube Q6H   amLODipine  10 mg Per Tube Daily   atorvastatin  40 mg Per Tube Daily   baclofen  5 mg Per Tube QHS   carvedilol  25 mg Per Tube BID WC   celecoxib  200 mg Per Tube Daily   chlorhexidine gluconate (MEDLINE KIT)  15 mL Mouth Rinse BID   Chlorhexidine Gluconate Cloth  6 each Topical Daily   diclofenac Sodium  4 g Topical QID   doxazosin  2 mg Per Tube Daily   feeding supplement  237 mL Oral TID  BM   feeding supplement (JEVITY 1.5 CAL/FIBER)  750 mL Per Tube Q24H   feeding supplement (PROSource TF)  45 mL Per Tube Daily   free water  200 mL Per Tube Q4H   hydrALAZINE  37.5 mg Per Tube Q8H   mouth rinse  15 mL Mouth Rinse 5 X Daily   modafinil  200 mg Per Tube Daily   multivitamin with minerals  1 tablet Per Tube Daily   pantoprazole sodium  40 mg Per Tube Daily   Continuous Infusions:  sodium chloride Stopped (10/26/20 1445)    Principal Problem:   ICH (intracerebral hemorrhage) (HCC) Active Problems:  Acute hypoxemic respiratory failure (HCC)   Tracheostomy dependent (HCC)   Palliative care by specialist   Muscle hypertonicity   Oropharyngeal dysphagia   Poor prognosis   Aspiration pneumonia of right lower lobe (HCC)   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  Palliative care.   PCCM continues to follow.  No decannulation.   Procedures: Tracheostomy PEG tube EEG Echocardiogram  Antibiotics: Vancomycin 2/12-2/15 Maxipime 2/12-2/15 Ancef 2/19-2/27 Vancomycin 2/20 x 1 dose Maxipime 2/27- 3/5 Vancomycin 3/1-3/3 Ancef 3/6-3/14 Cipro 3/20 4-3/30 Unasyn 3/30-4/4 Rocephin 4/11-4/12 Meropenem 4/12-4/17 Cipro 9/2 >> 9/8   Time spent: 15 minutes    Erin Hearing ANP  [facility] 7 am - 330 pm/M-F for direct patient care and secure chat Please refer to [place] for contact info 211  Days

## 2021-03-27 NOTE — Progress Notes (Signed)
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  Subsequently on 2/10, she developed obtundation and respiratory arrest leading to intubation.  She has remained persistently obtunded, now has a trach and a PEG.  Hospital course complicated by persistent fever, Serratia pneumonia and now pre-orbital cellulitis   As of 3/25 with adjustments in hypertonicity medications patient began to improve regarding hypertonicity.  Eventually started on provigil with improvements in alertness.  Evaluated by rehab physician who recommended increasing provigil dose further.  Subsequently patient has become more alert.  Most days she is able to track and with encouragement able to follow simple commands especially since partial obstruction glasses placed.  See progress note dictated 4/27 for expanded details.  Assessment & Plan:   Principal Problem:   ICH (intracerebral hemorrhage) (HCC) Active Problems:   Acute hypoxemic respiratory failure (HCC)   Tracheostomy dependent (HCC)   Palliative care by specialist   Muscle hypertonicity   Oropharyngeal dysphagia   Poor prognosis   Aspiration pneumonia of right lower lobe (HCC)   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   Acute cystitis without hematuria   Ileus, unspecified (HCC)   Constipation   Knee pain, right   Nonspecific abnormal electrocardiogram (ECG) (EKG)   Pseudomonas urinary tract infection  Acute problems: Multiple acute strokes  Right Thalamus ICH with IVH  Obstructive Hydrocephalus/left-sided neglect /Non TBI related hypertonicity -Continue HS baclofen -Recent suspected stroke related hearing loss although when patient's mother wrote to the patient "which  ear do you hear out of best" the patient wrote back "both" thus doubt hearing loss -Patient has been writing her name and responding to written questions by writing back.    Tracheostomy dependent -PCCM/trach team following closely regarding candidacy for decannulation. On 8/16 Dr. Dewald/PCCM discussed with patient's husband [name] regarding risks and benefits for decannulation and both [name] and family agree-reevaluated again on 9/6 with similar recommendations with family agreeing that risk outweighs benefit -Not yet a candidate for decannulation given lack of significant improvement thus far in terms of consistent cough strength and ability for her to cognitively understand and act on instructions to cough plus signs of impulsiveness and ongoing aspiration -Do not think she has enough memory capacity to remember not to inadvertently pull out her trach.  Continue mitten for safety reasons and not as a restraint   Microcytic anemia with known previous iron deficiency Continues with low MCV and hemoglobin 11 Repeat anemia panel September 2022: Iron normal at 63.  B12 1243 normal folate   Fever 2/2 Pseudomonas UTI Urine culture positive for pansensitive Pseudomonas Completed 7 days of Cipro   Constipation -Continue prn MiraLAX    Hypertensive emergency/Malignant HTN -Continue Norvasc, beta-blocker and hydralazine.   Dysphagia -Continue dysphagia 1 with thin liquids -Inconsistent oral intake therefore -continue nocturnal tube feedings   History of urinary retention -Continue Cardura      Other problems: Enterobacter cystitis -Urine culture with Enterobacter resistant to cefazolin, cefepime and Septra. -Previously given 2 days of IV Rocephin and since no signs of sepsis was given a one-time dose of fosfomycin over the weekend -Purewick dc'd due to contributing to recurrent UTIs   Right upper extremity thrombophlebitis - [name] -> age indeterminate SVT of right cephalic vein   Neck  Contracture/torticollis -- Resolved   Recurrent right knee pain/known ligamentous injury and chronic osteoarthritis -s/p injection of Marcaine and Depomedrol 6/30 -9/5 decreased Celebrex 200 mg to at bedtime only   ESBL positive Enterobacter UTI -Has completed 5 days of meropenem -Contact isolation during hospitalization -continue Oxybutynin suspected bladder spasms   Klebsiella UTI -Resistant to nitrofurantoin and ampicillin -To minimize risk of recurrent UTI have asked that pure wick only be used at bedtime on a 10 PM to 8 AM schedule   Recurrent diarrhea -Resolved   Bacterial Conjunctivitis /periorbital cellulitis -Resolved   Chronic pansinusitis/recurrent fevers 2/2 aspiration PNA;History of Serratia Pneumonia  Hospital Acquired Pneumonia:  -Completed several courses of antibiotics -Follow-up imaging including sinus CT consistent with chronic but stable and resolving sinusitis   Anemia -Stable   HLD -Continue Lipitor   DVT prophylaxis: SCD's Code Status: Full Family Communication: Pt in room, family not at bedside  Status is: Inpatient  Remains inpatient appropriate because:Unsafe d/c plan  Dispo: The patient is from: Home              Anticipated d/c is to: SNF              Patient currently is not medically stable to d/c.   Difficult to place patient Yes    Consultants:  Neurology  Neurosurgery.  Palliative care.   PCCM continues to follow.  No decannulation.  Procedures:  Tracheostomy PEG tube EEG Echocardiogram  Antimicrobials: Anti-infectives (From admission, onward)    Start     Dose/Rate Route Frequency Ordered Stop   03/19/21 1215  ciprofloxacin (CIPRO) tablet 500 mg        500 mg Per Tube 2 times daily 03/19/21 1124 03/25/21 2042   03/19/21 1145  ciprofloxacin (CIPRO) 500 MG/5ML (10%) suspension 500 mg  Status:  Discontinued        500 mg Oral 2 times daily 03/19/21 1059 03/19/21 1124   02/13/21 1430  fosfomycin (MONUROL) packet 3 g         3 g Oral  Once 02/13/21 1338 02/13/21 1454   02/12/21 1400  cefTRIAXone (ROCEPHIN) 1 g in sodium chloride 0.9 % 100 mL IVPB  Status:  Discontinued        1 g 200 mL/hr over 30 Minutes Intravenous Every 24 hours 02/12/21 1246 02/13/21 1338   01/20/21 1015  sulfamethoxazole-trimethoprim (BACTRIM) 200-40 MG/5ML suspension 20 mL        20 mL Per Tube Every 12 hours 01/20/21 0918 01/20/21 2210   01/20/21 0900  meropenem (MERREM) 1 g in sodium chloride 0.9 % 100 mL IVPB  Status:  Discontinued        1 g 200 mL/hr over 30 Minutes Intravenous Every 8 hours 01/20/21 0815 01/20/21 0917   01/18/21 1145  sulfamethoxazole-trimethoprim (BACTRIM) 200-40 MG/5ML suspension 20 mL  Status:  Discontinued        20 mL Per Tube Every 12 hours 01/18/21 1052 01/20/21 0742   01/18/21 1115  ciprofloxacin (CIPRO) 500 MG/5ML (10%) suspension 500 mg  Status:  Discontinued        500 mg Oral 2 times daily 01/18/21 1016 01/18/21 1051   12/19/20 1430  cefTRIAXone (ROCEPHIN) 1 g in sodium chloride 0.9 % 100 mL IVPB        1 g 200 mL/hr over 30 Minutes Intravenous Every 24 hours 12/19/20 1233 12/25/20 1711   12/18/20 1400  meropenem (MERREM) 1 g in sodium chloride 0.9 % 100 mL IVPB  Status:  Discontinued  1 g 200 mL/hr over 30 Minutes Intravenous Every 8 hours 12/18/20 1255 12/19/20 1233   12/06/20 1500  cefTRIAXone (ROCEPHIN) 1 g in sodium chloride 0.9 % 100 mL IVPB        1 g 200 mL/hr over 30 Minutes Intravenous Every 24 hours 12/06/20 1404 12/10/20 1500   12/04/20 1400  meropenem (MERREM) 1 g in sodium chloride 0.9 % 100 mL IVPB  Status:  Discontinued        1 g 200 mL/hr over 30 Minutes Intravenous Every 8 hours 12/04/20 1207 12/06/20 1404   10/27/20 0845  meropenem (MERREM) 1 g in sodium chloride 0.9 % 100 mL IVPB  Status:  Discontinued        1 g 200 mL/hr over 30 Minutes Intravenous Every 8 hours 10/27/20 0752 11/01/20 0720   10/26/20 1145  cefTRIAXone (ROCEPHIN) 1 g in sodium chloride 0.9 % 100 mL IVPB   Status:  Discontinued        1 g 200 mL/hr over 30 Minutes Intravenous Every 24 hours 10/26/20 1056 10/27/20 0752   10/14/20 1815  Ampicillin-Sulbactam (UNASYN) 3 g in sodium chloride 0.9 % 100 mL IVPB        3 g 200 mL/hr over 30 Minutes Intravenous Every 6 hours 10/14/20 1754 10/19/20 1340   10/08/20 0845  ciprofloxacin (CIPRO) tablet 500 mg  Status:  Discontinued        500 mg Per Tube 2 times daily 10/08/20 0754 10/14/20 1731   09/21/20 1545  ceFAZolin (ANCEF) IVPB 1 g/50 mL premix        1 g 100 mL/hr over 30 Minutes Intravenous Every 8 hours 09/21/20 1457 09/28/20 0647   09/20/20 0600  ceFAZolin (ANCEF) IVPB 1 g/50 mL premix  Status:  Discontinued        1 g 100 mL/hr over 30 Minutes Intravenous Every 8 hours 09/14/20 0903 09/14/20 0904   09/16/20 1300  vancomycin (VANCOREADY) IVPB 1250 mg/250 mL  Status:  Discontinued        1,250 mg 166.7 mL/hr over 90 Minutes Intravenous Every 24 hours 09/15/20 1232 09/17/20 0956   09/15/20 1230  vancomycin (VANCOREADY) IVPB 1250 mg/250 mL        1,250 mg 166.7 mL/hr over 90 Minutes Intravenous  Once 09/15/20 1131 09/15/20 1402   09/13/20 1400  ceFEPIme (MAXIPIME) 2 g in sodium chloride 0.9 % 100 mL IVPB        2 g 200 mL/hr over 30 Minutes Intravenous Every 8 hours 09/13/20 1341 09/19/20 2208   09/06/20 0600  vancomycin (VANCOREADY) IVPB 1250 mg/250 mL        1,250 mg 166.7 mL/hr over 90 Minutes Intravenous  Once 09/06/20 0541 09/06/20 0739   09/05/20 1400  ceFAZolin (ANCEF) IVPB 1 g/50 mL premix  Status:  Discontinued        1 g 100 mL/hr over 30 Minutes Intravenous Every 8 hours 09/05/20 1120 09/13/20 1341   08/30/20 0600  vancomycin (VANCOREADY) IVPB 500 mg/100 mL  Status:  Discontinued        500 mg 100 mL/hr over 60 Minutes Intravenous Every 12 hours 08/29/20 1554 09/01/20 1115   08/29/20 1700  ceFEPIme (MAXIPIME) 2 g in sodium chloride 0.9 % 100 mL IVPB  Status:  Discontinued        2 g 200 mL/hr over 30 Minutes Intravenous Every 12  hours 08/29/20 1554 09/01/20 1115   08/29/20 1645  vancomycin (VANCOREADY) IVPB 1250 mg/250 mL  1,250 mg 166.7 mL/hr over 90 Minutes Intravenous  Once 08/29/20 1554 08/29/20 1820       Subjective: Without complaints  Objective: Vitals:   03/27/21 0820 03/27/21 1056 03/27/21 1139 03/27/21 1206  BP: 110/65 137/87  (!) 141/85  Pulse: 87  89 92  Resp: '18  20 18  ' Temp: 99.6 F (37.6 C)   98.7 F (37.1 C)  TempSrc: Axillary   Axillary  SpO2: 98%  99% 97%  Weight:      Height:       No intake or output data in the 24 hours ending 03/27/21 1557 Filed Weights   03/16/21 0500 03/17/21 0500 03/23/21 0346  Weight: 65.1 kg 61.5 kg 67.5 kg    Examination: General exam: Awake, laying in bed, in nad Respiratory system: Normal respiratory effort, trach in place  Data Reviewed: I have personally reviewed following labs and imaging studies  CBC: Recent Labs  Lab 03/20/21 1732 03/25/21 0813  WBC 6.6 5.1  NEUTROABS 3.8 2.9  HGB 10.6* 10.3*  HCT 33.0* 32.8*  MCV 77.8* 78.3*  PLT 251 115   Basic Metabolic Panel: Recent Labs  Lab 03/25/21 0813  NA 138  K 3.7  CL 101  CO2 30  GLUCOSE 134*  BUN 21*  CREATININE 0.59  CALCIUM 9.0   GFR: Estimated Creatinine Clearance: 72.3 mL/min (by C-G formula based on SCr of 0.59 mg/dL). Liver Function Tests: No results for input(s): AST, ALT, ALKPHOS, BILITOT, PROT, ALBUMIN in the last 168 hours. No results for input(s): LIPASE, AMYLASE in the last 168 hours. Recent Labs  Lab 03/25/21 0813  AMMONIA 37*   Coagulation Profile: No results for input(s): INR, PROTIME in the last 168 hours. Cardiac Enzymes: No results for input(s): CKTOTAL, CKMB, CKMBINDEX, TROPONINI in the last 168 hours. BNP (last 3 results) No results for input(s): PROBNP in the last 8760 hours. HbA1C: No results for input(s): HGBA1C in the last 72 hours. CBG: Recent Labs  Lab 03/24/21 0535 03/25/21 0225 03/25/21 0625 03/26/21 0149 03/26/21 0541   GLUCAP 147* 135* 139* 125* 140*   Lipid Profile: No results for input(s): CHOL, HDL, LDLCALC, TRIG, CHOLHDL, LDLDIRECT in the last 72 hours. Thyroid Function Tests: No results for input(s): TSH, T4TOTAL, FREET4, T3FREE, THYROIDAB in the last 72 hours. Anemia Panel: No results for input(s): VITAMINB12, FOLATE, FERRITIN, TIBC, IRON, RETICCTPCT in the last 72 hours. Sepsis Labs: Recent Labs  Lab 03/20/21 1732 03/21/21 0122  PROCALCITON <0.10 <0.10    Recent Results (from the past 240 hour(s))  Urine Culture     Status: Abnormal   Collection Time: 03/19/21  7:19 AM   Specimen: Urine, Catheterized  Result Value Ref Range Status   Specimen Description URINE, CATHETERIZED  Final   Special Requests   Final    NONE Performed at [facility], [address], [place], [place] [address]    Culture >=100,000 COLONIES/mL PSEUDOMONAS AERUGINOSA (A)  Final   Report Status 03/21/2021 FINAL  Final   Organism ID, Bacteria PSEUDOMONAS AERUGINOSA (A)  Final      Susceptibility   Pseudomonas aeruginosa - MIC*    CEFTAZIDIME 4 SENSITIVE Sensitive     CIPROFLOXACIN <=0.25 SENSITIVE Sensitive     GENTAMICIN 2 SENSITIVE Sensitive     IMIPENEM 2 SENSITIVE Sensitive     PIP/TAZO 16 SENSITIVE Sensitive     CEFEPIME 4 SENSITIVE Sensitive     * >=100,000 COLONIES/mL PSEUDOMONAS AERUGINOSA  Culture, blood (Routine X 2) w Reflex to  ID Panel     Status: None   Collection Time: 03/19/21  8:29 AM   Specimen: BLOOD RIGHT HAND  Result Value Ref Range Status   Specimen Description BLOOD RIGHT HAND  Final   Special Requests   Final    BOTTLES DRAWN AEROBIC AND ANAEROBIC Blood Culture adequate volume   Culture   Final    NO GROWTH 5 DAYS Performed at [facility], [address], [place], [place] [address]    Report Status 03/24/2021 FINAL  Final  Culture, blood (Routine X 2) w Reflex to ID Panel     Status: None   Collection Time: 03/19/21  8:31 AM   Specimen: BLOOD LEFT HAND  Result Value  Ref Range Status   Specimen Description BLOOD LEFT HAND  Final   Special Requests   Final    BOTTLES DRAWN AEROBIC AND ANAEROBIC Blood Culture adequate volume   Culture   Final    NO GROWTH 5 DAYS Performed at [facility], [place] [address] [place], [place] [address]    Report Status 03/24/2021 FINAL  Final  Resp Panel by RT-PCR (Flu A&B, Covid) Nasopharyngeal Swab     Status: None   Collection Time: 03/19/21  1:43 PM   Specimen: Nasopharyngeal Swab; Nasopharyngeal(NP) swabs in vial transport medium  Result Value Ref Range Status   SARS Coronavirus 2 by RT PCR NEGATIVE NEGATIVE Final    Comment: (NOTE) SARS-CoV-2 target nucleic acids are NOT DETECTED.  The SARS-CoV-2 RNA is generally detectable in upper respiratory specimens during the acute phase of infection. The lowest concentration of SARS-CoV-2 viral copies this assay can detect is 138 copies/mL. A negative result does not preclude SARS-Cov-2 infection and should not be used as the sole basis for treatment or other patient management decisions. A negative result may occur with  improper specimen collection/handling, submission of specimen other than nasopharyngeal swab, presence of viral mutation(s) within the areas targeted by this assay, and inadequate number of viral copies(<138 copies/mL). A negative result must be combined with clinical observations, patient history, and epidemiological information. The expected result is Negative.  Fact Sheet for Patients:  EntrepreneurPulse.com.au  Fact Sheet for Healthcare Providers:  IncredibleEmployment.be  This test is no t yet approved or cleared by the [place] FDA and  has been authorized for detection and/or diagnosis of SARS-CoV-2 by FDA under an Emergency Use Authorization (EUA). This EUA will remain  in effect (meaning this test can be used) for the duration of the COVID-19 declaration under Section 564(b)(1) of the Act,  21 U.S.C.section 360bbb-3(b)(1), unless the authorization is terminated  or revoked sooner.       Influenza A by PCR NEGATIVE NEGATIVE Final   Influenza B by PCR NEGATIVE NEGATIVE Final    Comment: (NOTE) The Xpert Xpress SARS-CoV-2/FLU/RSV plus assay is intended as an aid in the diagnosis of influenza from Nasopharyngeal swab specimens and should not be used as a sole basis for treatment. Nasal washings and aspirates are unacceptable for Xpert Xpress SARS-CoV-2/FLU/RSV testing.  Fact Sheet for Patients: EntrepreneurPulse.com.au  Fact Sheet for Healthcare Providers: IncredibleEmployment.be  This test is not yet approved or cleared by the [place] FDA and has been authorized for detection and/or diagnosis of SARS-CoV-2 by FDA under an Emergency Use Authorization (EUA). This EUA will remain in effect (meaning this test can be used) for the duration of the COVID-19 declaration under Section 564(b)(1) of the Act, 21 U.S.C. section 360bbb-3(b)(1), unless the authorization is  terminated or revoked.  Performed at [facility], [place] [address] [place], [place] [address]      Radiology Studies: No results found.  Scheduled Meds:  acetaminophen  650 mg Per Tube Q6H   amLODipine  10 mg Per Tube Daily   atorvastatin  40 mg Per Tube Daily   baclofen  5 mg Per Tube QHS   carvedilol  25 mg Per Tube BID WC   celecoxib  200 mg Per Tube Daily   chlorhexidine gluconate (MEDLINE KIT)  15 mL Mouth Rinse BID   Chlorhexidine Gluconate Cloth  6 each Topical Daily   diclofenac Sodium  4 g Topical QID   doxazosin  2 mg Per Tube Daily   feeding supplement  237 mL Oral TID BM   feeding supplement (JEVITY 1.5 CAL/FIBER)  750 mL Per Tube Q24H   feeding supplement (PROSource TF)  45 mL Per Tube Daily   free water  200 mL Per Tube Q4H   hydrALAZINE  37.5 mg Per Tube Q8H   mouth rinse  15 mL Mouth Rinse 5 X Daily   modafinil  200 mg Per Tube Daily    multivitamin with minerals  1 tablet Per Tube Daily   pantoprazole sodium  40 mg Per Tube Daily   Continuous Infusions:  sodium chloride Stopped (10/26/20 1445)     LOS: 212 days   Marylu Lund, MD [facility] Pager On Amion  If 7PM-7AM, please contact night-coverage 03/27/2021, 3:57 PM

## 2021-03-28 NOTE — Plan of Care (Signed)
  Problem: Coping: Goal: Will identify appropriate support needs Outcome: Progressing   Problem: Health Behavior/Discharge Planning: Goal: Ability to manage health-related needs will improve Outcome: Progressing   Problem: Self-Care: Goal: Verbalization of feelings and concerns over difficulty with self-care will improve Outcome: Progressing Goal: Ability to communicate needs accurately will improve Outcome: Progressing   Problem: Nutrition: Goal: Risk of aspiration will decrease Outcome: Progressing   Problem: Intracerebral Hemorrhage Tissue Perfusion: Goal: Complications of Intracerebral Hemorrhage will be minimized Outcome: Progressing   Problem: Intracerebral Hemorrhage Tissue Perfusion: Goal: Complications of Intracerebral Hemorrhage will be minimized Outcome: Progressing   Problem: Ischemic Stroke/TIA Tissue Perfusion: Goal: Complications of ischemic stroke/TIA will be minimized Outcome: Progressing   Problem: Education: Goal: Knowledge of General Education information will improve Description: Including pain rating scale, medication(s)/side effects and non-pharmacologic comfort measures Outcome: Progressing   Problem: Health Behavior/Discharge Planning: Goal: Ability to manage health-related needs will improve Outcome: Progressing   Problem: Clinical Measurements: Goal: Ability to maintain clinical measurements within normal limits will improve Outcome: Progressing Goal: Will remain free from infection Outcome: Progressing Goal: Diagnostic test results will improve Outcome: Progressing Goal: Respiratory complications will improve Outcome: Progressing Goal: Cardiovascular complication will be avoided Outcome: Progressing   Problem: Coping: Goal: Level of anxiety will decrease Outcome: Progressing   Problem: Elimination: Goal: Will not experience complications related to bowel motility Outcome: Progressing Goal: Will not experience complications related  to urinary retention Outcome: Progressing   Problem: Pain Managment: Goal: General experience of comfort will improve Outcome: Progressing   Problem: Skin Integrity: Goal: Risk for impaired skin integrity will decrease Outcome: Progressing   

## 2021-03-28 NOTE — Progress Notes (Signed)
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  Subsequently on 2/10, she developed obtundation and respiratory arrest leading to intubation.  She has remained persistently obtunded, now has a trach and a PEG.  Hospital course complicated by persistent fever, Serratia pneumonia and now pre-orbital cellulitis   As of 3/25 with adjustments in hypertonicity medications patient began to improve regarding hypertonicity.  Eventually started on provigil with improvements in alertness.  Evaluated by rehab physician who recommended increasing provigil dose further.  Subsequently patient has become more alert.  Most days she is able to track and with encouragement able to follow simple commands especially since partial obstruction glasses placed.  See progress note dictated 4/27 for expanded details.  Assessment & Plan:   Principal Problem:   ICH (intracerebral hemorrhage) (HCC) Active Problems:   Acute hypoxemic respiratory failure (HCC)   Tracheostomy dependent (HCC)   Palliative care by specialist   Muscle hypertonicity   Oropharyngeal dysphagia   Poor prognosis   Aspiration pneumonia of right lower lobe (HCC)   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   Acute cystitis without hematuria   Ileus, unspecified (HCC)   Constipation   Knee pain, right   Nonspecific abnormal electrocardiogram (ECG) (EKG)   Pseudomonas urinary tract infection  Acute problems: Multiple acute strokes  Right Thalamus ICH with IVH  Obstructive Hydrocephalus/left-sided neglect /Non TBI related hypertonicity -Continue HS baclofen -Recent suspected stroke related hearing loss although when patient's mother wrote to the patient "which  ear do you hear out of best" the patient wrote back "both" thus doubt hearing loss -Has remained stable    Tracheostomy dependent -PCCM/trach team following closely regarding candidacy for decannulation. On 8/16 Dr. Dewald/PCCM discussed with patient's husband [name] regarding risks and benefits for decannulation and both [name] and family agree-reevaluated again on 9/6 with similar recommendations with family agreeing that risk outweighs benefit -Not yet a candidate for decannulation given lack of significant improvement thus far in terms of consistent cough strength and ability for her to cognitively understand and act on instructions to cough plus signs of impulsiveness and ongoing aspiration -Do not think she has enough memory capacity to remember not to inadvertently pull out her trach.  Continue mitten for safety reasons and not as a restraint   Microcytic anemia with known previous iron deficiency Continues with low MCV and hemoglobin 10.3 today Repeat anemia panel September 2022: Iron normal at 63.  B12 1243 normal folate   Fever 2/2 Pseudomonas UTI Urine culture positive for pansensitive Pseudomonas Completed 7 days of Cipro   Constipation -Continue prn MiraLAX    Hypertensive emergency/Malignant HTN -Continue Norvasc, beta-blocker and hydralazine.   Dysphagia -Continue dysphagia 1 with thin liquids -Inconsistent oral intake therefore -continue nocturnal tube feedings   History of urinary retention -Continue Cardura      Other problems: Enterobacter cystitis -Urine culture with Enterobacter resistant to cefazolin, cefepime and Septra. -Previously given 2 days of IV Rocephin and since no signs of sepsis was given a one-time dose of fosfomycin over the weekend -Purewick dc'd due to contributing to recurrent UTIs   Right upper extremity thrombophlebitis - [name] -> age indeterminate SVT of right cephalic vein   Neck Contracture/torticollis -- Resolved   Recurrent right knee  pain/known ligamentous  injury and chronic osteoarthritis -s/p injection of Marcaine and Depomedrol 6/30 -9/5 decreased Celebrex 200 mg to at bedtime only   ESBL positive Enterobacter UTI -Has completed 5 days of meropenem -Contact isolation during hospitalization -continue Oxybutynin suspected bladder spasms   Klebsiella UTI -Resistant to nitrofurantoin and ampicillin -To minimize risk of recurrent UTI have asked that pure wick only be used at bedtime on a 10 PM to 8 AM schedule   Recurrent diarrhea -Resolved   Bacterial Conjunctivitis /periorbital cellulitis -Resolved   Chronic pansinusitis/recurrent fevers 2/2 aspiration PNA;History of Serratia Pneumonia  Hospital Acquired Pneumonia:  -Completed several courses of antibiotics -Follow-up imaging including sinus CT consistent with chronic but stable and resolving sinusitis   Anemia -Stable   HLD -Continue Lipitor   DVT prophylaxis: SCD's Code Status: Full  Status is: Inpatient  Remains inpatient appropriate because:Unsafe d/c plan  Dispo: The patient is from: Home              Anticipated d/c is to: SNF              Patient currently is not medically stable to d/c.   Difficult to place patient Yes    Consultants:  Neurology  Neurosurgery.  Palliative care.   PCCM continues to follow.  No decannulation.  Procedures:  Tracheostomy PEG tube EEG Echocardiogram  Antimicrobials: Anti-infectives (From admission, onward)    Start     Dose/Rate Route Frequency Ordered Stop   03/19/21 1215  ciprofloxacin (CIPRO) tablet 500 mg        500 mg Per Tube 2 times daily 03/19/21 1124 03/25/21 2042   03/19/21 1145  ciprofloxacin (CIPRO) 500 MG/5ML (10%) suspension 500 mg  Status:  Discontinued        500 mg Oral 2 times daily 03/19/21 1059 03/19/21 1124   02/13/21 1430  fosfomycin (MONUROL) packet 3 g        3 g Oral  Once 02/13/21 1338 02/13/21 1454   02/12/21 1400  cefTRIAXone (ROCEPHIN) 1 g in sodium chloride 0.9 %  100 mL IVPB  Status:  Discontinued        1 g 200 mL/hr over 30 Minutes Intravenous Every 24 hours 02/12/21 1246 02/13/21 1338   01/20/21 1015  sulfamethoxazole-trimethoprim (BACTRIM) 200-40 MG/5ML suspension 20 mL        20 mL Per Tube Every 12 hours 01/20/21 0918 01/20/21 2210   01/20/21 0900  meropenem (MERREM) 1 g in sodium chloride 0.9 % 100 mL IVPB  Status:  Discontinued        1 g 200 mL/hr over 30 Minutes Intravenous Every 8 hours 01/20/21 0815 01/20/21 0917   01/18/21 1145  sulfamethoxazole-trimethoprim (BACTRIM) 200-40 MG/5ML suspension 20 mL  Status:  Discontinued        20 mL Per Tube Every 12 hours 01/18/21 1052 01/20/21 0742   01/18/21 1115  ciprofloxacin (CIPRO) 500 MG/5ML (10%) suspension 500 mg  Status:  Discontinued        500 mg Oral 2 times daily 01/18/21 1016 01/18/21 1051   12/19/20 1430  cefTRIAXone (ROCEPHIN) 1 g in sodium chloride 0.9 % 100 mL IVPB        1 g 200 mL/hr over 30 Minutes Intravenous Every 24 hours 12/19/20 1233 12/25/20 1711   12/18/20 1400  meropenem (MERREM) 1 g in sodium chloride 0.9 % 100 mL IVPB  Status:  Discontinued        1 g 200 mL/hr over 30 Minutes Intravenous Every 8 hours 12/18/20  1255 12/19/20 1233   12/06/20 1500  cefTRIAXone (ROCEPHIN) 1 g in sodium chloride 0.9 % 100 mL IVPB        1 g 200 mL/hr over 30 Minutes Intravenous Every 24 hours 12/06/20 1404 12/10/20 1500   12/04/20 1400  meropenem (MERREM) 1 g in sodium chloride 0.9 % 100 mL IVPB  Status:  Discontinued        1 g 200 mL/hr over 30 Minutes Intravenous Every 8 hours 12/04/20 1207 12/06/20 1404   10/27/20 0845  meropenem (MERREM) 1 g in sodium chloride 0.9 % 100 mL IVPB  Status:  Discontinued        1 g 200 mL/hr over 30 Minutes Intravenous Every 8 hours 10/27/20 0752 11/01/20 0720   10/26/20 1145  cefTRIAXone (ROCEPHIN) 1 g in sodium chloride 0.9 % 100 mL IVPB  Status:  Discontinued        1 g 200 mL/hr over 30 Minutes Intravenous Every 24 hours 10/26/20 1056 10/27/20 0752    10/14/20 1815  Ampicillin-Sulbactam (UNASYN) 3 g in sodium chloride 0.9 % 100 mL IVPB        3 g 200 mL/hr over 30 Minutes Intravenous Every 6 hours 10/14/20 1754 10/19/20 1340   10/08/20 0845  ciprofloxacin (CIPRO) tablet 500 mg  Status:  Discontinued        500 mg Per Tube 2 times daily 10/08/20 0754 10/14/20 1731   09/21/20 1545  ceFAZolin (ANCEF) IVPB 1 g/50 mL premix        1 g 100 mL/hr over 30 Minutes Intravenous Every 8 hours 09/21/20 1457 09/28/20 0647   09/20/20 0600  ceFAZolin (ANCEF) IVPB 1 g/50 mL premix  Status:  Discontinued        1 g 100 mL/hr over 30 Minutes Intravenous Every 8 hours 09/14/20 0903 09/14/20 0904   09/16/20 1300  vancomycin (VANCOREADY) IVPB 1250 mg/250 mL  Status:  Discontinued        1,250 mg 166.7 mL/hr over 90 Minutes Intravenous Every 24 hours 09/15/20 1232 09/17/20 0956   09/15/20 1230  vancomycin (VANCOREADY) IVPB 1250 mg/250 mL        1,250 mg 166.7 mL/hr over 90 Minutes Intravenous  Once 09/15/20 1131 09/15/20 1402   09/13/20 1400  ceFEPIme (MAXIPIME) 2 g in sodium chloride 0.9 % 100 mL IVPB        2 g 200 mL/hr over 30 Minutes Intravenous Every 8 hours 09/13/20 1341 09/19/20 2208   09/06/20 0600  vancomycin (VANCOREADY) IVPB 1250 mg/250 mL        1,250 mg 166.7 mL/hr over 90 Minutes Intravenous  Once 09/06/20 0541 09/06/20 0739   09/05/20 1400  ceFAZolin (ANCEF) IVPB 1 g/50 mL premix  Status:  Discontinued        1 g 100 mL/hr over 30 Minutes Intravenous Every 8 hours 09/05/20 1120 09/13/20 1341   08/30/20 0600  vancomycin (VANCOREADY) IVPB 500 mg/100 mL  Status:  Discontinued        500 mg 100 mL/hr over 60 Minutes Intravenous Every 12 hours 08/29/20 1554 09/01/20 1115   08/29/20 1700  ceFEPIme (MAXIPIME) 2 g in sodium chloride 0.9 % 100 mL IVPB  Status:  Discontinued        2 g 200 mL/hr over 30 Minutes Intravenous Every 12 hours 08/29/20 1554 09/01/20 1115   08/29/20 1645  vancomycin (VANCOREADY) IVPB 1250 mg/250 mL        1,250  mg 166.7 mL/hr over 90 Minutes Intravenous  Once 08/29/20 1554 08/29/20 1820       Subjective: No complaints noted  Objective: Vitals:   03/28/21 0721 03/28/21 0827 03/28/21 1111 03/28/21 1140  BP: 122/75 125/79 139/87 137/90  Pulse: 91 80 85 81  Resp: '16 16 16 16  ' Temp:  98.6 F (37 C)    TempSrc:  Axillary    SpO2: 99% 99% 100% 100%  Weight:      Height:       No intake or output data in the 24 hours ending 03/28/21 1540 Filed Weights   03/16/21 0500 03/17/21 0500 03/23/21 0346  Weight: 65.1 kg 61.5 kg 67.5 kg    Examination: Respiratory system: normal chest rise, clear, no audible wheezing Central nervous system: No seizures, no tremors  Data Reviewed: I have personally reviewed following labs and imaging studies  CBC: Recent Labs  Lab 03/25/21 0813  WBC 5.1  NEUTROABS 2.9  HGB 10.3*  HCT 32.8*  MCV 78.3*  PLT 585    Basic Metabolic Panel: Recent Labs  Lab 03/25/21 0813  NA 138  K 3.7  CL 101  CO2 30  GLUCOSE 134*  BUN 21*  CREATININE 0.59  CALCIUM 9.0    GFR: Estimated Creatinine Clearance: 72.3 mL/min (by C-G formula based on SCr of 0.59 mg/dL). Liver Function Tests: No results for input(s): AST, ALT, ALKPHOS, BILITOT, PROT, ALBUMIN in the last 168 hours. No results for input(s): LIPASE, AMYLASE in the last 168 hours. Recent Labs  Lab 03/25/21 0813  AMMONIA 37*    Coagulation Profile: No results for input(s): INR, PROTIME in the last 168 hours. Cardiac Enzymes: No results for input(s): CKTOTAL, CKMB, CKMBINDEX, TROPONINI in the last 168 hours. BNP (last 3 results) No results for input(s): PROBNP in the last 8760 hours. HbA1C: No results for input(s): HGBA1C in the last 72 hours. CBG: Recent Labs  Lab 03/24/21 0535 03/25/21 0225 03/25/21 0625 03/26/21 0149 03/26/21 0541  GLUCAP 147* 135* 139* 125* 140*    Lipid Profile: No results for input(s): CHOL, HDL, LDLCALC, TRIG, CHOLHDL, LDLDIRECT in the last 72 hours. Thyroid  Function Tests: No results for input(s): TSH, T4TOTAL, FREET4, T3FREE, THYROIDAB in the last 72 hours. Anemia Panel: No results for input(s): VITAMINB12, FOLATE, FERRITIN, TIBC, IRON, RETICCTPCT in the last 72 hours. Sepsis Labs: No results for input(s): PROCALCITON, LATICACIDVEN in the last 168 hours.   Recent Results (from the past 240 hour(s))  Urine Culture     Status: Abnormal   Collection Time: 03/19/21  7:19 AM   Specimen: Urine, Catheterized  Result Value Ref Range Status   Specimen Description URINE, CATHETERIZED  Final   Special Requests   Final    NONE Performed at [facility], [address], [place], [place] [address]    Culture >=100,000 COLONIES/mL PSEUDOMONAS AERUGINOSA (A)  Final   Report Status 03/21/2021 FINAL  Final   Organism ID, Bacteria PSEUDOMONAS AERUGINOSA (A)  Final      Susceptibility   Pseudomonas aeruginosa - MIC*    CEFTAZIDIME 4 SENSITIVE Sensitive     CIPROFLOXACIN <=0.25 SENSITIVE Sensitive     GENTAMICIN 2 SENSITIVE Sensitive     IMIPENEM 2 SENSITIVE Sensitive     PIP/TAZO 16 SENSITIVE Sensitive     CEFEPIME 4 SENSITIVE Sensitive     * >=100,000 COLONIES/mL PSEUDOMONAS AERUGINOSA  Culture, blood (Routine X 2) w Reflex to ID Panel     Status: None   Collection Time: 03/19/21  8:29 AM  Specimen: BLOOD RIGHT HAND  Result Value Ref Range Status   Specimen Description BLOOD RIGHT HAND  Final   Special Requests   Final    BOTTLES DRAWN AEROBIC AND ANAEROBIC Blood Culture adequate volume   Culture   Final    NO GROWTH 5 DAYS Performed at [facility], [address], [place], [place] [address]    Report Status 03/24/2021 FINAL  Final  Culture, blood (Routine X 2) w Reflex to ID Panel     Status: None   Collection Time: 03/19/21  8:31 AM   Specimen: BLOOD LEFT HAND  Result Value Ref Range Status   Specimen Description BLOOD LEFT HAND  Final   Special Requests   Final    BOTTLES DRAWN AEROBIC AND ANAEROBIC Blood Culture adequate  volume   Culture   Final    NO GROWTH 5 DAYS Performed at [facility], [place] [address] [place], [place] [address]    Report Status 03/24/2021 FINAL  Final  Resp Panel by RT-PCR (Flu A&B, Covid) Nasopharyngeal Swab     Status: None   Collection Time: 03/19/21  1:43 PM   Specimen: Nasopharyngeal Swab; Nasopharyngeal(NP) swabs in vial transport medium  Result Value Ref Range Status   SARS Coronavirus 2 by RT PCR NEGATIVE NEGATIVE Final    Comment: (NOTE) SARS-CoV-2 target nucleic acids are NOT DETECTED.  The SARS-CoV-2 RNA is generally detectable in upper respiratory specimens during the acute phase of infection. The lowest concentration of SARS-CoV-2 viral copies this assay can detect is 138 copies/mL. A negative result does not preclude SARS-Cov-2 infection and should not be used as the sole basis for treatment or other patient management decisions. A negative result may occur with  improper specimen collection/handling, submission of specimen other than nasopharyngeal swab, presence of viral mutation(s) within the areas targeted by this assay, and inadequate number of viral copies(<138 copies/mL). A negative result must be combined with clinical observations, patient history, and epidemiological information. The expected result is Negative.  Fact Sheet for Patients:  EntrepreneurPulse.com.au  Fact Sheet for Healthcare Providers:  IncredibleEmployment.be  This test is no t yet approved or cleared by the [place] FDA and  has been authorized for detection and/or diagnosis of SARS-CoV-2 by FDA under an Emergency Use Authorization (EUA). This EUA will remain  in effect (meaning this test can be used) for the duration of the COVID-19 declaration under Section 564(b)(1) of the Act, 21 U.S.C.section 360bbb-3(b)(1), unless the authorization is terminated  or revoked sooner.       Influenza A by PCR NEGATIVE NEGATIVE Final   Influenza  B by PCR NEGATIVE NEGATIVE Final    Comment: (NOTE) The Xpert Xpress SARS-CoV-2/FLU/RSV plus assay is intended as an aid in the diagnosis of influenza from Nasopharyngeal swab specimens and should not be used as a sole basis for treatment. Nasal washings and aspirates are unacceptable for Xpert Xpress SARS-CoV-2/FLU/RSV testing.  Fact Sheet for Patients: EntrepreneurPulse.com.au  Fact Sheet for Healthcare Providers: IncredibleEmployment.be  This test is not yet approved or cleared by the [place] FDA and has been authorized for detection and/or diagnosis of SARS-CoV-2 by FDA under an Emergency Use Authorization (EUA). This EUA will remain in effect (meaning this test can be used) for the duration of the COVID-19 declaration under Section 564(b)(1) of the Act, 21 U.S.C. section 360bbb-3(b)(1), unless the authorization is terminated or revoked.  Performed at [facility], [place] [address] [place], [place] [address]  Radiology Studies: No results found.  Scheduled Meds:  acetaminophen  650 mg Per Tube Q6H   amLODipine  10 mg Per Tube Daily   atorvastatin  40 mg Per Tube Daily   baclofen  5 mg Per Tube QHS   carvedilol  25 mg Per Tube BID WC   celecoxib  200 mg Per Tube Daily   chlorhexidine gluconate (MEDLINE KIT)  15 mL Mouth Rinse BID   Chlorhexidine Gluconate Cloth  6 each Topical Daily   diclofenac Sodium  4 g Topical QID   doxazosin  2 mg Per Tube Daily   feeding supplement  237 mL Oral TID BM   feeding supplement (JEVITY 1.5 CAL/FIBER)  750 mL Per Tube Q24H   feeding supplement (PROSource TF)  45 mL Per Tube Daily   free water  200 mL Per Tube Q4H   hydrALAZINE  37.5 mg Per Tube Q8H   mouth rinse  15 mL Mouth Rinse 5 X Daily   modafinil  200 mg Per Tube Daily   multivitamin with minerals  1 tablet Per Tube Daily   pantoprazole sodium  40 mg Per Tube Daily   Continuous Infusions:  sodium chloride Stopped  (10/26/20 1445)     LOS: 213 days   Marylu Lund, MD [facility] Pager On Amion  If 7PM-7AM, please contact night-coverage 03/28/2021, 3:40 PM

## 2021-03-29 LAB — GLUCOSE, CAPILLARY
Glucose-Capillary: 128 mg/dL — ABNORMAL HIGH (ref 70–99)
Glucose-Capillary: 147 mg/dL — ABNORMAL HIGH (ref 70–99)
Glucose-Capillary: 97 mg/dL (ref 70–99)
Glucose-Capillary: 147 mg/dL — ABNORMAL HIGH (ref 70–99)
Glucose-Capillary: 138 mg/dL — ABNORMAL HIGH (ref 70–99)
Glucose-Capillary: 149 mg/dL — ABNORMAL HIGH (ref 70–99)

## 2021-03-29 NOTE — Plan of Care (Signed)
  Problem: Coping: Goal: Will identify appropriate support needs Outcome: Progressing   Problem: Health Behavior/Discharge Planning: Goal: Ability to manage health-related needs will improve Outcome: Progressing   Problem: Self-Care: Goal: Verbalization of feelings and concerns over difficulty with self-care will improve Outcome: Progressing Goal: Ability to communicate needs accurately will improve Outcome: Progressing   Problem: Nutrition: Goal: Risk of aspiration will decrease Outcome: Progressing   Problem: Intracerebral Hemorrhage Tissue Perfusion: Goal: Complications of Intracerebral Hemorrhage will be minimized Outcome: Progressing   Problem: Intracerebral Hemorrhage Tissue Perfusion: Goal: Complications of Intracerebral Hemorrhage will be minimized Outcome: Progressing   Problem: Ischemic Stroke/TIA Tissue Perfusion: Goal: Complications of ischemic stroke/TIA will be minimized Outcome: Progressing   Problem: Education: Goal: Knowledge of General Education information will improve Description: Including pain rating scale, medication(s)/side effects and non-pharmacologic comfort measures Outcome: Progressing   Problem: Health Behavior/Discharge Planning: Goal: Ability to manage health-related needs will improve Outcome: Progressing   Problem: Clinical Measurements: Goal: Ability to maintain clinical measurements within normal limits will improve Outcome: Progressing Goal: Will remain free from infection Outcome: Progressing Goal: Diagnostic test results will improve Outcome: Progressing Goal: Respiratory complications will improve Outcome: Progressing Goal: Cardiovascular complication will be avoided Outcome: Progressing   Problem: Coping: Goal: Level of anxiety will decrease Outcome: Progressing   Problem: Elimination: Goal: Will not experience complications related to bowel motility Outcome: Progressing Goal: Will not experience complications related  to urinary retention Outcome: Progressing

## 2021-03-29 NOTE — Progress Notes (Signed)
   NAME:  Charlene [name], MRN:  [id number], DOB:  Mar 13, 1966, LOS: 214 ADMISSION DATE:  08/26/2020, CONSULTATION DATE:  2/10 REFERRING MD:  Roda Shutters, CHIEF COMPLAINT:  headache   History of Present Illness:  55 year old female admitted with hypertensive R thalamic/basal ganglia ICH. Resultant obstructive hydrocephalus prompting EVD placement 2/10. Subsequently, 2/10 she had acute mental status change to obtundation and respiratory arrest resulting in intubation. Trach and Peg 2/24. Trach change to #6 cuffless on 3/11.   Hospital course complicated by Serratia pneumonia, ESBL UTI, preorbital cellulitis  Pertinent  Medical History  Hypertension, Medication non-compliance  Significant Hospital Events: Including procedures, antibiotic start and stop dates in addition to other pertinent events   2/09 Admit for ICH 2/10 EVD placed, later intubated due to obtundation and respiratory arrest 2/11 MRI Brain, hypertonic saline d/c'd, EVD not working.  Husband did not consent to PICC  2/13 EVD removed 2/17 GOC discussion with family, PMT 2/24 tracheostomy placed by Dr. Denese Killings, PEG placed by Dr. Sheliah Hatch 3/11 trach changed to Shiley 6 uncuffed 3/21 sputum culture with Serratia resistant to Ancef 4/18 28% ATC / 5L  5/08 downsized trach to 4 7/11 No significant secretions / no issues with trach 7/21 Self de-cannulated, trach replaced 7/25 ATC 21%/5L. Using PSV, some speech per SLP, eating 8/1 tolerating PMV, minimal secretions 8/22 Per SLP-- delayed cough, leaking fluids from her mouth, still needs pureed diet. Speech unintelligible, but able to attempt to say names with SLP. Has still had episodes where she is too sleepy to participate with therapies. Family at bedside today-- she thinks Charlene [name] is pretty awake today. 8/23 Pt pulled trach out, reinserted w/o difficulty  Interim History / Subjective:   No significant events since last evaluation. Family at bedside notes that patient can now answer  questions via notepad and has been eating more. We discussed plan to work on coughing on command.   Objective   Blood pressure 134/89, pulse 79, temperature 97.8 F (36.6 C), temperature source Axillary, resp. rate 20, height 5\' 5"  (1.651 m), weight 67.5 kg, last menstrual period 03/03/2014, SpO2 100 %, unknown if currently breastfeeding.    FiO2 (%):  [21 %] 21 %  No intake or output data in the 24 hours ending 03/29/21 0859  Filed Weights   03/16/21 0500 03/17/21 0500 03/23/21 0346  Weight: 65.1 kg 61.5 kg 67.5 kg    Examination: General:  Resting comfortably in bed HENT: NCAT, OP clear tracheostomy in place PULM: Clear breathe sounds. No increased work of breathing, no tachypnea.  CV: RRR, no mgr GI: BS+, soft, nontender MSK: normal bulk and tone Neuro: Awake and alert.   Resolved Hospital Problem list     Assessment & Plan:   Tracheostomy Dependent: 2/2 ICH in February 2022 Dysphagia Barriers to decannulation thus far include inconsistent cough and lack of airway protection. Mental status has been improving with advancement in diet though. Discussed with family that she may be a candidate for a cap trial given these improvements but will need to work on cough strength. Will follow up SLP evaluation this afternoon.   -Continue routine trach care  -Consider capping trial tomorrow pending SLP evaluation -SNF recommended per protocol -Continue working with SLP -PT/OT    Dr. March 2022 Internal Medicine PGY-3 03/29/2021, 9:02 AM

## 2021-03-29 NOTE — Progress Notes (Signed)
  Speech Language Pathology Treatment: [name] Speaking valve;Cognitive-Linquistic  Patient Details Name: Charlene [name] MRN: [id number] DOB: 08-12-1965 Today's Date: 03/29/2021 Time: 1400-1420 SLP Time Calculation (min) (ACUTE ONLY): 20 min  Assessment / Plan / Recommendation Clinical Impression  Pt was seen for skilled ST intervention targeting goals for PMSV tolerance, reading aloud, completing automatic sequences, communicating wants and needs, and use of AAC to respond to questions. Pt was awake and alert. She tolerated PMSV placement with no change in sats. She did not attempt to phonate or repeat automatic sequences, but smiled in response to SLP smiling at her and exhibited good eye contact. Pt was unable to answer yes/no questions at this time. PMSV was removed and placed in container so pt could sleep.    HPI HPI: Pt is 55 y/o female presents to [facility] on 2/9 with L sided weakness and headaches. PMH includes anxiety, HTN. 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 S/p Right frontal ventricular catheter placement on 2/10, stopped functioning on 2/11.  MRI 2/11 shows right greater than left mesial temporal lobes / parahippocampal cortex, bilateral basal ganglia, and possibly the upper pons that are concerning for acute infarcts, most likely secondary to mass effect from the hemorrhage. ETT 2/10; trach/PEG 09/10/20.  Trach changed to #6 cuffless 3/11; #4 cuffless 5/4. Has had two MBS studies, the last on 7/8 - pt started on dysphagia 1, thin liquids. Trach replaced following self-decannulation 7/21, again on 8/23.      SLP Plan  Continue with current plan of care;Goals updated       Recommendations  Diet recommendations: Dysphagia 1 (puree);Thin liquid Liquids provided via: Straw Medication Administration: Via alternative means Supervision: Staff  to assist with self feeding;Trained caregiver to feed patient;Full supervision/cueing for compensatory strategies Compensations: Minimize environmental distractions;Small sips/bites Postural Changes and/or Swallow Maneuvers: Seated upright 90 degrees      Patient may use Passy-Muir Speech Valve: During all waking hours (remove during sleep);During PO intake/meals PMSV Supervision: Intermittent         Oral Care Recommendations: Oral care BID Follow up Recommendations: Skilled Nursing facility SLP Visit Diagnosis: Dysphagia, oropharyngeal phase (R13.12) Plan: Continue with current plan of care;Goals updated       GO              Ravneet Spilker B. Murvin Natal, [facility], CCC-SLP Speech Language Pathologist Office: [phone]  Leigh Aurora 03/29/2021, 2:29 PM

## 2021-03-29 NOTE — Progress Notes (Addendum)
12:40pm: CSW spoke with [place] who states she can accept the patient but the patient must not be in any form of restraints (mitt or sitter) for at least 24 hours prior to discharge.  CSW spoke with NP and MD regarding need to discontinue all restraints and sitter orders.   11:50am: CSW spoke with Calvin at [facility] who states the bed offer is still available.  CSW spoke with Geanie [facility] of [facility] who states she has been unable to locate a facility closer to [facility] that can accommodate the patient's needs.  9:30am: CSW attempted to reach [place] in admissions and Jerilynn Som, Librarian, academic at [employer] in [place] without success - voicemails were left requesting a return call.  CSW attempted to reach Marlene Lard without success - a voicemail was left requesting a return call.  Edwin Dada, MSW, LCSW Transitions of Care  Clinical Social Worker II [phone]

## 2021-03-29 NOTE — Progress Notes (Signed)
Orthopedic Tech Progress Note Patient Details:  Charlene [name] December 17, 1965 [id number] THERAPY is making if not already has made a SOFT SPLINT for patient    Patient ID: Charlene [name], female   DOB: October 13, 1965, 55 y.o.   MRN: [id number]  Donald Pore 03/29/2021, 11:14 AM

## 2021-03-29 NOTE — Progress Notes (Signed)
[facility] PROGRESS NOTE  SHIR BERGMAN QIH:[id number] DOB: 1965/11/13 DOA: 08/26/2020 PCP: Patient, [name] (Inactive)    October 08, 2020     10/16/2020                        10/16/2020     10/18/2020                        10/20/2020      12/30/2020                   12/30/2020     01/04/2021                   02/24/2021 Viv really needs a teddy bear!   Status: Remains inpatient appropriate because:Altered mental status and Unsafe d/c plan: Requirement to discharge to trach capable SNF   Dispo: The patient is from: Home              Anticipated d/c is to: SNF trach capable-clinicals resubmitted to multiple facilities as of 8/31.  Unable to decannulate 2/2 and lack of appropriate voluntary and involuntary cough response              Patient currently is medically stable to d/c.   Difficult to place patient Yes         Barriers to DC: Continued requirement for right hand mitt to prevent self decannulation.  This is considered a restraint by SNFs. patient is not doing this by accident she is purposefully D cannulating herself.   Level of care: Telemetry Medical  Code Status: Full Family Communication: [name] [phone]) is her POA and legal contact.  Extensive conversation held with [name] by phone on 9/12.  Also updated patient mother as well as daughter Donnald Garre at bedside on 9/12 DVT prophylaxis: Chronically bedbound so Lovenox discontinued   Vaccination status: Unvaccinated-husband has agreed to [employer] vaccine but only if required by accepting facility  Micro Data:  2/9 COVID + Influenza negative 2/10 MRSA PCR negative 2/12 BCx 2 > no growth  2/19 BC 1/2 +Stap EPI, 1/2 no growth  10/07/2020 sputum positive for Serratia marcescens 4/8 Enterobacter cloaca + urine culture MDR sensitive only to imipenem and gentamicin 5/20 urine positive for Klebsiella resistant to ampicillin and Macrodantin 6/2 urine positive for Klebsiella resistant to ampicillin,  cefazolin and Macrodantin 7/4 urine positive for ESBL Klebsiella resistant to ampicillin, cefazolin and Macrodantin 7/28 urine positive for Enterobacter resistant to cefazolin, cefepime and Septra 9/2 urine positive for pansensitive Pseudomonas     Antimicrobials:  Cefepime 2/12 > 2/15 Vanc 2/12 > 2/15 10/08/2020 ciprofloxacin>>3/30 Unasyn 3/30 >> 4/02 Rocephin 4/11 > 4/12 Meropenem 4/12>4/17 Meropenem 5/20 through 5/22 Ceftriaxone 5/22 through 5/26 Meropenem 6/3 Ceftriaxone 6/4 through 6/10 Septra 7/4 through 7/7 Ceftriaxone 7/29 was 1 dose followed by 1 dose of fosfomycin Cipro 9/2 >>   HPI: 55 yo F with 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  Subsequently on 2/10, she developed obtundation and respiratory arrest leading to intubation.  She has remained persistently obtunded, now has a trach and a PEG.  Hospital course complicated by persistent fever, Serratia pneumonia and now pre-orbital cellulitis  As of 3/25 with adjustments in hypertonicity medications patient began to improve regarding hypertonicity.  Eventually started on provigil with improvements in alertness.  Evaluated by rehab physician who recommended increasing provigil dose further.  Subsequently patient has become more alert.  Most days she is able to track and with encouragement able to follow simple commands especially since partial obstruction glasses placed.  See progress note dictated 4/27 for expanded details.   Significant events: 2/09 Admit for ICH 2/10 EVD placed, later intubated due to obtundation and respiratory arrest 2/11 MRI Brain, hypertonic saline d/c'd, EVD not working.  Husband did not consent to PICC  2/13 EVD removed 2/17 [place] discussion with family, PMT 2/24 Bedside trach and PEG 3/1 Transferred to [facility]  Subjective: Patient in much better spirits today especially with both mother and daughter at bedside.  I  discussed with family Jimena's sadness and depression noting that she was crying on Friday and after discussion with her I was able to determine that she was not hurting but she was sad and she missed being at home with her family.  Kearstyn is smiling and partially laughing when her daughter is tickling her right foot.  Objective: Vitals:   03/29/21 0426 03/29/21 0759  BP: 133/87 134/89  Pulse: 76 79  Resp: 18 18  Temp: 97.7 F (36.5 C) 97.8 F (36.6 C)  SpO2: 100% 100%   Filed Weights   03/16/21 0500 03/17/21 0500 03/23/21 0346  Weight: 65.1 kg 61.5 kg 67.5 kg    Exam:       Gen: Awake and alert, sad today, interacting with family and even smiling and laughing when foot tickled Respiratory: #4.0 cuffless trach, trach collar with FiO2 21% at 5L; tracheostomy in midline position, lungs remain clear.  No increased work of breathing Cardiovascular: S1-S2, regular pulse no peripheral edema, adequate capillary refill Abdomen:  PEG /nocturnal tube feedings. LBM 9/08, nondistended, normoactive bowel sounds. Neurological: CN 2 -12 intact. Spontaneous purposeful movement of right upper extremity/strength 4/5.   RLE strength 2/5 with purposeful movement.  Persistent left-side neglect.   Psychiatric: Awake and alert.  Appears to be oriented and family shows me several notes that she has written to them  Assessment/Plan: Acute problems: Multiple acute strokes  Right Thalamus ICH with IVH  Obstructive Hydrocephalus/left-sided neglect /Non TBI related hypertonicity -Continue HS baclofen 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 although when patient's mother wrote to the patient "which ear do you hear out of best" the patient wrote back "both".  At this point I do not suspect any acute hearing loss -Patient has been writing her name and responding to written questions by writing back. -Previous soft LUE splint apparently unavailable due to no stock.  Have asked for OT to reevaluate appropriateness of  short left arm splint  Tracheostomy dependent -PCCM/trach team following closely regarding candidacy for decannulation. On 8/16 Dr. Dewald/PCCM discussed with patient's husband [name] regarding risks and benefits for decannulation and both [name] and family agree-reevaluated again on 9/6 with similar recommendations with family agreeing that risk outweighs benefit -Not yet a candidate for decannulation given lack of significant improvement thus far in terms of consistent cough strength and ability for her to cognitively understand and act on instructions to cough plus signs of impulsiveness and ongoing aspiration -Family now working with patient to see if she can cough on command in hopes that by performing voluntary coughing she would be a candidate for decannulation -I am also working with staff to see if we can obtain a sitter to use from 9 PM to 7 AM to allow for removal of right hand mitt.  Plan is to also obtain specialty call light that patient would be able to use  so if she is having discomfort from the trach she will call the staff instead of pulling at the trach and inadvertently to cannulating herself.  It is felt that smit will disqualify patient for SNF placement due to the fact it will be considered as a restraint.  Staff RN today states she will speak with the charge nurse to see if we can have intermittent sitter to allow this plan to go forward  Microcytic anemia with known previous iron deficiency Continues with low MCV and hemoglobin 11 Repeat anemia panel September 2022: Iron normal at 63.  B12 1243 normal folate  Fever 2/2 Pseudomonas UTI Urine culture positive for pansensitive Pseudomonas Completed 7 days of Cipro  Constipation -Continue prn MiraLAX   Hypertensive emergency/Malignant HTN -Continue Norvasc, beta-blocker and hydralazine.  Dysphagia -Continue dysphagia 1 with thin liquids -Inconsistent oral intake therefore -continue nocturnal tube feedings  History of  urinary retention -Continue Cardura    Other problems: Enterobacter cystitis -Urine culture with Enterobacter resistant to cefazolin, cefepime and Septra. -Previously given 2 days of IV Rocephin and since no signs of sepsis was given a one-time dose of fosfomycin over the weekend -Purewick dc'd due to contributing to recurrent UTIs  Right upper extremity thrombophlebitis - [name] -> age indeterminate SVT of right cephalic vein  Neck Contracture/torticollis -- Resolved  Recurrent right knee pain/known ligamentous injury and chronic osteoarthritis -s/p injection of Marcaine and Depomedrol 6/30 -9/5 decreased Celebrex 200 mg to at bedtime only  ESBL positive Enterobacter UTI -Has completed 5 days of meropenem -Contact isolation during hospitalization -continue Oxybutynin suspected bladder spasms  Klebsiella UTI -Resistant to nitrofurantoin and ampicillin -To minimize risk of recurrent UTI have asked that pure wick only be used at bedtime on a 10 PM to 8 AM schedule  Recurrent diarrhea -Resolved  Bacterial Conjunctivitis /periorbital cellulitis -Resolved  Chronic pansinusitis/recurrent fevers 2/2 aspiration PNA;History of Serratia Pneumonia  Hospital Acquired Pneumonia:  -Completed several courses of antibiotics -Follow-up imaging including sinus CT consistent with chronic but stable and resolving sinusitis   Anemia -Stable  HLD -Continue Lipitor      Data Reviewed: Basic Metabolic Panel: Recent Labs  Lab 03/25/21 0813  NA 138  K 3.7  CL 101  CO2 30  GLUCOSE 134*  BUN 21*  CREATININE 0.59  CALCIUM 9.0       Liver Function Tests: No results for input(s): AST, ALT, ALKPHOS, BILITOT, PROT, ALBUMIN in the last 168 hours.      CBC: Recent Labs  Lab 03/25/21 0813  WBC 5.1  NEUTROABS 2.9  HGB 10.3*  HCT 32.8*  MCV 78.3*  PLT 250       CBG: Recent Labs  Lab 03/24/21 0535 03/25/21 0225 03/25/21 0625 03/26/21 0149 03/26/21 0541  GLUCAP  147* 135* 139* 125* 140*     Studies: No results found.   Scheduled Meds:  acetaminophen  650 mg Per Tube Q6H   amLODipine  10 mg Per Tube Daily   atorvastatin  40 mg Per Tube Daily   baclofen  5 mg Per Tube QHS   carvedilol  25 mg Per Tube BID WC   celecoxib  200 mg Per Tube Daily   chlorhexidine gluconate (MEDLINE KIT)  15 mL Mouth Rinse BID   Chlorhexidine Gluconate Cloth  6 each Topical Daily   diclofenac Sodium  4 g Topical QID   doxazosin  2 mg Per Tube Daily   feeding supplement  237 mL Oral TID BM   feeding supplement (JEVITY  1.5 CAL/FIBER)  750 mL Per Tube Q24H   feeding supplement (PROSource TF)  45 mL Per Tube Daily   free water  200 mL Per Tube Q4H   hydrALAZINE  37.5 mg Per Tube Q8H   mouth rinse  15 mL Mouth Rinse 5 X Daily   modafinil  200 mg Per Tube Daily   multivitamin with minerals  1 tablet Per Tube Daily   pantoprazole sodium  40 mg Per Tube Daily   Continuous Infusions:  sodium chloride Stopped (10/26/20 1445)    Principal Problem:   ICH (intracerebral hemorrhage) ([place]) Active Problems:   Acute hypoxemic respiratory failure (HCC)   Tracheostomy dependent (HCC)   Palliative care by specialist   Muscle hypertonicity   Oropharyngeal dysphagia   Poor prognosis   Aspiration pneumonia of right lower lobe (HCC)   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  Palliative care.   PCCM continues to follow.  No decannulation.   Procedures: Tracheostomy PEG tube EEG Echocardiogram  Antibiotics: Vancomycin 2/12-2/15 Maxipime 2/12-2/15 Ancef 2/19-2/27 Vancomycin 2/20 x 1 dose Maxipime 2/27- 3/5 Vancomycin 3/1-3/3 Ancef 3/6-3/14 Cipro 3/20 4-3/30 Unasyn  3/30-4/4 Rocephin 4/11-4/12 Meropenem 4/12-4/17 Cipro 9/2 >> 9/8   Time spent: 15 minutes    Erin Hearing ANP  [facility] 7 am - 330 pm/M-F for direct patient care and secure chat Please refer to [place] for contact info [phone]  Days

## 2021-03-30 LAB — GLUCOSE, CAPILLARY: Glucose-Capillary: 159 mg/dL — ABNORMAL HIGH (ref 70–99)

## 2021-03-30 NOTE — Progress Notes (Signed)
NAME:  Charlene [name], MRN:  [id number], DOB:  June 08, 1966, LOS: 215 ADMISSION DATE:  08/26/2020, CONSULTATION DATE:  2/10 REFERRING MD:  Roda Shutters, CHIEF COMPLAINT:  headache   History of Present Illness:  55 year old female admitted with hypertensive R thalamic/basal ganglia ICH. Resultant obstructive hydrocephalus prompting EVD placement 2/10. Subsequently, 2/10 she had acute mental status change to obtundation and respiratory arrest resulting in intubation. Trach and Peg 2/24. Trach change to #6 cuffless on 3/11.   Hospital course complicated by Serratia pneumonia, ESBL UTI, preorbital cellulitis  Pertinent  Medical History  Hypertension, Medication non-compliance  Significant Hospital Events: Including procedures, antibiotic start and stop dates in addition to other pertinent events   2/09 Admit for ICH 2/10 EVD placed, later intubated due to obtundation and respiratory arrest 2/11 MRI Brain, hypertonic saline d/c'd, EVD not working.  Husband did not consent to PICC  2/13 EVD removed 2/17 GOC discussion with family, PMT 2/24 tracheostomy placed by Dr. Denese Killings, PEG placed by Dr. Sheliah Hatch 3/11 trach changed to Shiley 6 uncuffed 3/21 sputum culture with Serratia resistant to Ancef 4/18 28% ATC / 5L  5/08 downsized trach to 4 7/11 No significant secretions / no issues with trach 7/21 Self de-cannulated, trach replaced 7/25 ATC 21%/5L. Using PSV, some speech per SLP, eating 8/1 tolerating PMV, minimal secretions 8/22 Per SLP-- delayed cough, leaking fluids from her mouth, still needs pureed diet. Speech unintelligible, but able to attempt to say names with SLP. Has still had episodes where she is too sleepy to participate with therapies. Family at bedside today-- she thinks Charlene [name] is pretty awake today. 8/23 Pt pulled trach out, reinserted w/o difficulty  Interim History / Subjective:   No new events Appears comfortable on trach collar  Objective   Blood pressure 137/79, pulse 84,  temperature 98.6 F (37 C), temperature source Axillary, resp. rate 17, height 5\' 5"  (1.651 m), weight 67.5 kg, last menstrual period 03/03/2014, SpO2 100 %, unknown if currently breastfeeding.    FiO2 (%):  [21 %] 21 %   Intake/Output Summary (Last 24 hours) at 03/30/2021 1323 Last data filed at 03/30/2021 0704 Gross per 24 hour  Intake 1950 ml  Output --  Net 1950 ml    Filed Weights   03/16/21 0500 03/17/21 0500 03/23/21 0346  Weight: 65.1 kg 61.5 kg 67.5 kg    Examination: General:  Resting comfortably in bed HENT: NCAT, OP clear tracheostomy in place PULM: No accessory muscle use, decreased breath sounds bilateral, shallow breathing  CV: S1-S2 regular GI: BS+, soft, nontender MSK: normal bulk and tone Neuro: Awake and alert.   Resolved Hospital Problem list     Assessment & Plan:   Right thalamic ICH feb 2022 with hydrocephalus and brain infarcts involving possibly the upper pons Tracheostomy Dependent: 2/2 ICH in February 2022 Dysphagia -speech evaluation 9/12 recommended dysphagia 1 diet with full supervision Barriers to decannulation thus far include inconsistent cough and lack of airway protection. Mental status has improved.  -Continue routine trach care  -SNF placement recommended, currently use of mitt restraint is holding this up, she is not in mittens today -PT/OT    Long discussion with husband Charlene [name] at bedside.  Discussed risks and benefits of decannulation.  Explained that she remains aspiration risk and even if decannulation were successful in the short-term, there would be some long-term risk of aspiration pneumonia and recurrent episode of mechanical ventilation.  After our discussion, he preferred that we not attempt decannulation. Also discussed our plan for  accidental decannulation, he prefers that we replace tracheostomy in this scenario  PCCM will follow again next week  Cyril Mourning MD. [facility]. [facility] Pager : [phone]  If no response to pager , please call [phone] until 7 pm After 7:00 pm call Elink  [phone]     03/30/2021, 1:23 PM

## 2021-03-30 NOTE — Progress Notes (Signed)
[facility] PROGRESS NOTE  Charlene [name] QIH:[id number] DOB: 04/20/1966 DOA: 08/26/2020 PCP: Patient, [name] (Inactive)    October 08, 2020     10/16/2020                        10/16/2020     10/18/2020                        10/20/2020      12/30/2020                   12/30/2020     01/04/2021                   02/24/2021 Viv really needs a teddy bear!   Status: Remains inpatient appropriate because:Altered mental status and Unsafe d/c plan: Requirement to discharge to trach capable SNF   Dispo: The patient is from: Home              Anticipated d/c is to: SNF trach capable-clinicals resubmitted to multiple facilities as of 8/31.  Unable to decannulate 2/2 and lack of appropriate voluntary and involuntary cough response              Patient currently is medically stable to d/c.   Difficult to place patient Yes         Barriers to DC: Continued requirement for right hand mitt to prevent self decannulation.  This is considered a restraint by SNFs. patient is not doing this by accident she is purposefully D cannulating herself.   Level of care: Telemetry Medical  Code Status: Full Family Communication: [name] [phone]) is her POA and legal contact.  Extensive conversation held with [name] by phone on 9/12.  Also updated patient mother as well as daughter Donnald Garre at bedside on 9/12 DVT prophylaxis: Chronically bedbound so Lovenox discontinued   Vaccination status: Unvaccinated-husband has agreed to [employer] vaccine but only if required by accepting facility  Micro Data:  2/9 COVID + Influenza negative 2/10 MRSA PCR negative 2/12 BCx 2 > no growth  2/19 BC 1/2 +Stap EPI, 1/2 no growth  10/07/2020 sputum positive for Serratia marcescens 4/8 Enterobacter cloaca + urine culture MDR sensitive only to imipenem and gentamicin 5/20 urine positive for Klebsiella resistant to ampicillin and Macrodantin 6/2 urine positive for Klebsiella resistant to ampicillin,  cefazolin and Macrodantin 7/4 urine positive for ESBL Klebsiella resistant to ampicillin, cefazolin and Macrodantin 7/28 urine positive for Enterobacter resistant to cefazolin, cefepime and Septra 9/2 urine positive for pansensitive Pseudomonas     Antimicrobials:  Cefepime 2/12 > 2/15 Vanc 2/12 > 2/15 10/08/2020 ciprofloxacin>>3/30 Unasyn 3/30 >> 4/02 Rocephin 4/11 > 4/12 Meropenem 4/12>4/17 Meropenem 5/20 through 5/22 Ceftriaxone 5/22 through 5/26 Meropenem 6/3 Ceftriaxone 6/4 through 6/10 Septra 7/4 through 7/7 Ceftriaxone 7/29 was 1 dose followed by 1 dose of fosfomycin Cipro 9/2 >>   HPI: 55 yo F with 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  Subsequently on 2/10, she developed obtundation and respiratory arrest leading to intubation.  She has remained persistently obtunded, now has a trach and a PEG.  Hospital course complicated by persistent fever, Serratia pneumonia and now pre-orbital cellulitis  As of 3/25 with adjustments in hypertonicity medications patient began to improve regarding hypertonicity.  Eventually started on provigil with improvements in alertness.  Evaluated by rehab physician who recommended increasing provigil dose further.  Subsequently patient has become more alert.  Most days she is able to track and with encouragement able to follow simple commands especially since partial obstruction glasses placed.  See progress note dictated 4/27 for expanded details.   Significant events: 2/09 Admit for ICH 2/10 EVD placed, later intubated due to obtundation and respiratory arrest 2/11 MRI Brain, hypertonic saline d/c'd, EVD not working.  Husband did not consent to PICC  2/13 EVD removed 2/17 [place] discussion with family, PMT 2/24 Bedside trach and PEG 3/1 Transferred to [facility]  Subjective: Patient awake and alert and staring at photographs of family to the right on the  windowsill/ledge.  Discussed with patient plan to remove mitt on right hand as long as family or staff in room.  We are going to train her on how to use specialty call light.  I reminded her that we are doing this to try to get the mitt off and that if she is having discomfort or issues with the tracheostomy tube that she is to hit the call light to notify staff.  I told the patient that because of staffing issues we do not have a sitter who can stay in the room with her during the night while her mid is off to remind her not to pull at the trach.  Objective: Vitals:   03/30/21 0349 03/30/21 0425  BP: 119/64 119/64  Pulse: 93 90  Resp:  16  Temp: 98.3 F (36.8 C)   SpO2: 100% 100%   Filed Weights   03/16/21 0500 03/17/21 0500 03/23/21 0346  Weight: 65.1 kg 61.5 kg 67.5 kg    Exam:       Gen: Awake and alert, primarily focused on photographs of family and husband at bedside. Respiratory: #4.0 cuffless trach, trach collar with FiO2 21% at 5L; remain clear to auscultation with normal pulse oximetry and normal respiratory effort.  No tracheal secretions detected Cardiovascular: Heart sounds remain normal, pulses regular without ectopy, she is normotensive without peripheral edema  Abdomen:  PEG /nocturnal tube feedings. LBM 9/11, nondistended, normoactive bowel sounds. Neurological: CN 2 -12 intact. Spontaneous purposeful movement of right upper extremity/strength 4/5.   RLE strength 2/5 with purposeful movement.  Persistent left-side neglect.   Psychiatric: Awake and alert.   Assessment/Plan: Acute problems: Multiple acute strokes  Right Thalamus ICH with IVH  Obstructive Hydrocephalus/left-sided neglect /Non TBI related hypertonicity -Continue HS baclofen 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 although when patient's mother wrote to the patient "which ear do you hear out of best" the patient wrote back "both".  At this point I do not suspect any acute hearing loss -Patient has been  writing her name and responding to written questions by writing back. -Previous soft LUE splint apparently unavailable due to no stock.  Have asked for OT to reevaluate appropriateness of short left arm splint  Tracheostomy dependent -PCCM/trach team following closely regarding candidacy for decannulation. On 8/16 Dr. Dewald/PCCM discussed with patient's husband [name] regarding risks and benefits for decannulation and both [name] and family agree-reevaluated again on 9/6 with similar recommendations with family agreeing that risk outweighs benefit -Not yet a candidate for decannulation given lack of significant improvement thus far in terms of consistent cough strength and ability for her to cognitively understand and act on instructions to cough plus signs of impulsiveness and ongoing aspiration -Family now working with patient to see if she can cough on command in hopes that by performing voluntary coughing she would be a candidate for decannulation -Fortunately  unable to obtain sitter from 9 PM to 7 AM to allow for removal of right hand mitt.  Specialty call light now at bedside and plan is to teach patient how to use the call light and hopes that if she has issues with the trach she will use the call light and not try to adjust trach ties or ambulate trach independently.  Plan is to train patient so we can eventually remove the mitt since this is a barrier to SNF placement  Microcytic anemia with known previous iron deficiency Continues with low MCV and hemoglobin 11 Repeat anemia panel September 2022: Iron normal at 63.  B12 1243 normal folate  Fever 2/2 Pseudomonas UTI Urine culture positive for pansensitive Pseudomonas Completed 7 days of Cipro  Constipation -Continue prn MiraLAX   Hypertensive emergency/Malignant HTN -Continue Norvasc, beta-blocker and hydralazine.  Dysphagia -Continue dysphagia 1 with thin liquids -Inconsistent oral intake therefore -continue nocturnal tube  feedings  History of urinary retention -Continue Cardura    Other problems: Enterobacter cystitis -Urine culture with Enterobacter resistant to cefazolin, cefepime and Septra. -Previously given 2 days of IV Rocephin and since no signs of sepsis was given a one-time dose of fosfomycin over the weekend -Purewick dc'd due to contributing to recurrent UTIs  Right upper extremity thrombophlebitis - [name] -> age indeterminate SVT of right cephalic vein  Neck Contracture/torticollis -- Resolved  Recurrent right knee pain/known ligamentous injury and chronic osteoarthritis -s/p injection of Marcaine and Depomedrol 6/30 -9/5 decreased Celebrex 200 mg to at bedtime only  ESBL positive Enterobacter UTI -Has completed 5 days of meropenem -Contact isolation during hospitalization -continue Oxybutynin suspected bladder spasms  Klebsiella UTI -Resistant to nitrofurantoin and ampicillin -To minimize risk of recurrent UTI have asked that pure wick only be used at bedtime on a 10 PM to 8 AM schedule  Recurrent diarrhea -Resolved  Bacterial Conjunctivitis /periorbital cellulitis -Resolved  Chronic pansinusitis/recurrent fevers 2/2 aspiration PNA;History of Serratia Pneumonia  Hospital Acquired Pneumonia:  -Completed several courses of antibiotics -Follow-up imaging including sinus CT consistent with chronic but stable and resolving sinusitis   Anemia -Stable  HLD -Continue Lipitor      Data Reviewed: Basic Metabolic Panel: Recent Labs  Lab 03/25/21 0813  NA 138  K 3.7  CL 101  CO2 30  GLUCOSE 134*  BUN 21*  CREATININE 0.59  CALCIUM 9.0       Liver Function Tests: No results for input(s): AST, ALT, ALKPHOS, BILITOT, PROT, ALBUMIN in the last 168 hours.      CBC: Recent Labs  Lab 03/25/21 0813  WBC 5.1  NEUTROABS 2.9  HGB 10.3*  HCT 32.8*  MCV 78.3*  PLT 250       CBG: Recent Labs  Lab 03/27/21 0612 03/27/21 1951 03/28/21 0416 03/29/21 0204  03/29/21 0625  GLUCAP 147* 97 147* 138* 149*     Studies: No results found.   Scheduled Meds:  acetaminophen  650 mg Per Tube Q6H   amLODipine  10 mg Per Tube Daily   atorvastatin  40 mg Per Tube Daily   baclofen  5 mg Per Tube QHS   carvedilol  25 mg Per Tube BID WC   celecoxib  200 mg Per Tube Daily   chlorhexidine gluconate (MEDLINE KIT)  15 mL Mouth Rinse BID   Chlorhexidine Gluconate Cloth  6 each Topical Daily   diclofenac Sodium  4 g Topical QID   doxazosin  2 mg Per Tube Daily   feeding supplement  237 mL Oral TID BM   feeding supplement (JEVITY 1.5 CAL/FIBER)  750 mL Per Tube Q24H   feeding supplement (PROSource TF)  45 mL Per Tube Daily   free water  200 mL Per Tube Q4H   hydrALAZINE  37.5 mg Per Tube Q8H   mouth rinse  15 mL Mouth Rinse 5 X Daily   modafinil  200 mg Per Tube Daily   multivitamin with minerals  1 tablet Per Tube Daily   pantoprazole sodium  40 mg Per Tube Daily   Continuous Infusions:  sodium chloride Stopped (10/26/20 1445)    Principal Problem:   ICH (intracerebral hemorrhage) ([place]) Active Problems:   Acute hypoxemic respiratory failure (HCC)   Tracheostomy dependent (HCC)   Palliative care by specialist   Muscle hypertonicity   Oropharyngeal dysphagia   Poor prognosis   Aspiration pneumonia of right lower lobe (HCC)   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  Palliative care.   PCCM continues to follow.  No decannulation.   Procedures: Tracheostomy PEG tube EEG Echocardiogram  Antibiotics: Vancomycin 2/12-2/15 Maxipime 2/12-2/15 Ancef 2/19-2/27 Vancomycin 2/20 x 1 dose Maxipime 2/27- 3/5 Vancomycin 3/1-3/3 Ancef 3/6-3/14 Cipro 3/20  4-3/30 Unasyn 3/30-4/4 Rocephin 4/11-4/12 Meropenem 4/12-4/17 Cipro 9/2 >> 9/8   Time spent: 15 minutes    Erin Hearing ANP  [facility] 7 am - 330 pm/M-F for direct patient care and secure chat Please refer to [place] for contact info 215  Days

## 2021-03-31 NOTE — Progress Notes (Signed)
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  Subsequently on 2/10, she developed obtundation and respiratory arrest leading to intubation.  She has remained persistently obtunded, now has a trach and a PEG.  Hospital course complicated by persistent fever, Serratia pneumonia and now pre-orbital cellulitis. Patient began to improve with adjustments in hypertonicity medications after 3/25. Eventually started on provigil with improvements in alertness.  Evaluated by rehab physician who recommended increasing provigil dose further.  Subsequently patient has become more alert.  Most days she is able to track and with encouragement able to follow simple commands especially since partial obstruction glasses placed.    Significant events: 2/09 Admit for ICH 2/10 EVD placed, later intubated due to obtundation and respiratory arrest 2/11 MRI Brain, hypertonic saline d/c'd, EVD not working.  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 Patient has been writing her name and responding to written questions by writing back. Previous soft LUE splint apparently unavailable due to no stock.  Have asked for OT to reevaluate appropriateness of short left arm splint   Tracheostomy dependent PCCM/trach team following closely regarding candidacy for decannulation.  On 8/16 Dr. Tresa Moore discussed with patient's husband [name] regarding risks and benefits for decannulation and both [name] and family agree-reevaluated again on 9/6 with similar recommendations with family agreeing that risks outweighs benefit -Not yet a candidate for decannulation given lack of significant improvement thus far in terms of consistent cough strength and ability for her to cognitively understand and act on instructions to cough plus signs of impulsiveness and ongoing aspiration -Family now working with patient to see if she can cough on command in hopes that by performing voluntary coughing she would be a candidate for decannulation -Fortunately unable to obtain sitter from 9 PM to 7 AM to allow for removal of right hand mitt.  Specialty call light now at bedside and plan is to teach patient how to use the call light and hopes that if she has issues with the trach she will use the call light and not try to adjust trach ties or ambulate trach independently.  Plan is to train patient so we can eventually remove the mitt since this is a barrier to SNF placement   Microcytic anemia multifactorial   Patient does have previous  hx. Of  iron deficiency Continues with low MCV and hemoglobin 11 Repeat anemia panel September 2022: Iron normal at 63.  B12 1243  normal folate   Pseudomonas UTI: Urine culture positive for pansensitive Pseudomonas Completed 7 days of Cipro   Constipation: Continue prn MiraLAX    Hypertension Continue  Norvasc, beta-blocker and hydralazine.   Dysphagia Continue dysphagia 1 with thin liquids Inconsistent oral intake therefore -continue nocturnal tube feedings   History of urinary retention Continue Cardura   Other problems: Enterobacter cystitis Urine culture with Enterobacter resistant to cefazolin, cefepime and Septra. Previously given 2 days of IV Rocephin and since no signs of sepsis was given a one-time dose of fosfomycin over the weekend Purewick dc'd due to contributing to recurrent UTIs   Right upper extremity thrombophlebitis [name] -> age indeterminate SVT of right cephalic vein.  Neck Contracture/torticollis Resolved.  Recurrent right knee pain/known ligamentous injury and chronic osteoarthritis s/p injection of Marcaine and Depomedrol 6/30 9/5 decreased Celebrex 200 mg to at bedtime only   ESBL positive Enterobacter UTI Has completed 5 days of meropenem Contact isolation during hospitalization continue Oxybutynin suspected bladder spasms   Klebsiella UTI Resistant to nitrofurantoin and ampicillin To minimize risk of recurrent UTI have asked that pure wick only be used at bedtime on a 10 PM to 8 AM schedule   Recurrent diarrhea Resolved.    Chronic pansinusitis/recurrent fevers 2/2 aspiration PNA;History of Serratia Pneumonia  Hospital Acquired Pneumonia:  Completed several courses of antibiotics Follow-up imaging including sinus CT consistent with chronic but stable and resolving sinusitis  DVT prophylaxis:Chronic bed bound. Code Status: Full code. Family Communication: Mother at bed side. Disposition Plan:  Status is: Inpatient  Remains inpatient appropriate because:Inpatient level of care appropriate due to severity of illness  Dispo: The patient is from: Home              Anticipated d/c is to: SNF              Patient currently is medically stable for discharge.   Difficult to place patient yes.  SNF trach capable-clinicals resubmitted to multiple  facilities as of 8/31.  Unable to decannulate 2/2 and lack of appropriate voluntary and involuntary cough response.             Consultants:  Neurology, neurosurgery,  palliative care, PCCM  Procedures:  Tracheostomy, PEG tube, EEG, echocardiogram  Antimicrobials:  Antibiotics: Vancomycin 2/12-2/15 Maxipime 2/12-2/15 Ancef 2/19-2/27 Vancomycin 2/20 x 1 dose Maxipime 2/27- 3/5 Vancomycin 3/1-3/3 Ancef 3/6-3/14 Cipro 3/20 4-3/30 Unasyn 3/30-4/4 Rocephin 4/11-4/12 Meropenem 4/12-4/17 Cipro 9/2 >> 9/8    Subjective: Patient was seen and examined at bedside.  Patient is alert and awake, following partial commands. Patient remains with right hand mittens.  Objective: Vitals:   03/31/21 0822 03/31/21 1058 03/31/21 1100 03/31/21 1201  BP:  139/73 117/77   Pulse:  83 80 79  Resp: 16   16  Temp:   (!) 96.3 F (35.7 C)   TempSrc:   Oral   SpO2:   100% 100%  Weight:      Height:       No intake or output data in the 24 hours ending 03/31/21 1313 Filed Weights   03/16/21 0500 03/17/21 0500 03/23/21 0346  Weight: 65.1 kg 61.5 kg 67.5 kg    Examination:  General exam: Appears comfortable, deconditioned, not in any acute distress. Respiratory system: Clear to auscultation bilaterally, tracheostomy noted. Cardiovascular system: S1-S2 heard, regular rate and rhythm, no murmur. Gastrointestinal system: Abdomen is soft, nontender, nondistended, BS+, PEG tube noted. Central nervous system: Alert and awake.  Left-sided negative. Extremities: No edema, no  cyanosis, no clubbing. Skin: No rashes, lesions or ulcers Psychiatry: Not assessed.    Data Reviewed: I have personally reviewed following labs and imaging studies  CBC: Recent Labs  Lab 03/25/21 0813  WBC 5.1  NEUTROABS 2.9  HGB 10.3*  HCT 32.8*  MCV 78.3*  PLT 815   Basic Metabolic Panel: Recent Labs  Lab 03/25/21 0813  NA 138  K 3.7  CL 101  CO2 30  GLUCOSE 134*  BUN 21*  CREATININE 0.59  CALCIUM 9.0    GFR: Estimated Creatinine Clearance: 72.3 mL/min (by C-G formula based on SCr of 0.59 mg/dL). Liver Function Tests: No results for input(s): AST, ALT, ALKPHOS, BILITOT, PROT, ALBUMIN in the last 168 hours. No results for input(s): LIPASE, AMYLASE in the last 168 hours. Recent Labs  Lab 03/25/21 0813  AMMONIA 37*   Coagulation Profile: No results for input(s): INR, PROTIME in the last 168 hours. Cardiac Enzymes: No results for input(s): CKTOTAL, CKMB, CKMBINDEX, TROPONINI in the last 168 hours. BNP (last 3 results) No results for input(s): PROBNP in the last 8760 hours. HbA1C: No results for input(s): HGBA1C in the last 72 hours. CBG: Recent Labs  Lab 03/27/21 1951 03/28/21 0416 03/29/21 0204 03/29/21 0625 03/30/21 0624  GLUCAP 97 147* 138* 149* 159*   Lipid Profile: No results for input(s): CHOL, HDL, LDLCALC, TRIG, CHOLHDL, LDLDIRECT in the last 72 hours. Thyroid Function Tests: No results for input(s): TSH, T4TOTAL, FREET4, T3FREE, THYROIDAB in the last 72 hours. Anemia Panel: No results for input(s): VITAMINB12, FOLATE, FERRITIN, TIBC, IRON, RETICCTPCT in the last 72 hours. Sepsis Labs: No results for input(s): PROCALCITON, LATICACIDVEN in the last 168 hours.  No results found for this or any previous visit (from the past 240 hour(s)).   Radiology Studies: No results found.  Scheduled Meds:  acetaminophen  650 mg Per Tube Q6H   amLODipine  10 mg Per Tube Daily   atorvastatin  40 mg Per Tube Daily   baclofen  5 mg Per Tube QHS   carvedilol  25 mg Per Tube BID WC   celecoxib  200 mg Per Tube Daily   chlorhexidine gluconate (MEDLINE KIT)  15 mL Mouth Rinse BID   Chlorhexidine Gluconate Cloth  6 each Topical Daily   diclofenac Sodium  4 g Topical QID   doxazosin  2 mg Per Tube Daily   feeding supplement  237 mL Oral TID BM   feeding supplement (JEVITY 1.5 CAL/FIBER)  750 mL Per Tube Q24H   feeding supplement (PROSource TF)  45 mL Per Tube Daily   free water   200 mL Per Tube Q4H   hydrALAZINE  37.5 mg Per Tube Q8H   mouth rinse  15 mL Mouth Rinse 5 X Daily   modafinil  200 mg Per Tube Daily   multivitamin with minerals  1 tablet Per Tube Daily   pantoprazole sodium  40 mg Per Tube Daily   Continuous Infusions:  sodium chloride Stopped (10/26/20 1445)     LOS: 216 days    Time spent: 35 mins    Nielle Duford, MD [facility]   If 7PM-7AM, please contact night-coverage

## 2021-03-31 NOTE — Progress Notes (Signed)
Nutrition Follow-up  DOCUMENTATION CODES:  Not applicable  INTERVENTION:  -Initiate 48 hour calorie count -Continue to encourage PO intake & assist as needed -Continue Ensure Enlive po TID -Continue MVI with minerals daily  Continue nocturnal TF via PEG: -Jevity 1.5 @ 22ml/hr for 10 hours from 2000-0600 ( total) -67ml PROSource TF daily Provides 1165 kcals, 58 grams protein, free water -- meets ~73% minimum estimated calorie and protein needs -Free water flushes per MD, currently Q4H (provides total free water w/ TF and flushes)  NUTRITION DIAGNOSIS:  Inadequate oral intake related to inability to eat as evidenced by NPO status. 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  Subsequently on 2/10, pt developed obtundation and respiratory arrest leading to intubation. Pt is now s/p trach/PEG placement. Hospital course complicated by Serratia pneumonia, ESBL Enterobacter UTI and pre-orbital cellulitis.  2/10 s/p EVD placement and intubation for respiratory arrest 2/13 EVD removed 2/17 palliative care consult; family requesting tx to [facility] 2/24 s/p trach and PEG 3/1 tx to [facility] 3/11 trach changed to shiley #6 cuffless 4/08 pt w/ sepsis 2/2 UTI 4/18 28% ATC/5L 5/2 CIR reevaluated pt and determined she has not made enough improvement for admit; recommended SNF 5/8 trach changed to shiley #4 cuffless 5/23 CCM evaluated and determined pt stilll unable to decannulate due to variable mental status 6/3  trach exchanged, still shiley #4 cuffless 6/30 s/p R knee aspiration and injection 7/3 trach exchanged, still shiley #4 cuffless 7/8 diet advanced to dysphagia 1 with thin liquids 7/17 trach exchanged, still shiley #4 cuffless 7/21 trach replaced after pt self-decannulated  8/01 tolerating PMV, minimal secretions 8/02 PEG replaced after pt removed 8/14 trach exchanged, still shiley #4 cuffless 8/23 trach reinserted (still shiley #4 cuffless) after pt decannulated herself  Pt's mental status remains improved. Pt still requiring use of mitten to prevent self-decannulation. CCM discussed risks of decannulation with pt's spouse who has elected to continue with TC at this time given concern for aspiration.   Limited PO intake documented since last RD assessment. Pt does well with meals when family is present, particularly her spouse. Will initiate calorie count so adequacy of po intake can be assessed.   Admission weight: 61.2 kg Weight as on 03/23/21: 67.5 kg  Generalized mild pitting edema noted per RN edema assessment  UOP: 2x unmeasured occurrences documented x24 hours  Medications: Ensure Enlive/Plus TID, mvi with minerals, protonix Labs reviewed. CBGs 149-159 x 24 hours  Current TF: Jevity 1.5 @ 54ml/hr administered over 10 hours (2000-0600) to provide TF per night, 42ml Prosource TF daily, free water Q4H  Diet Order:   Diet Order             DIET - DYS 1 Room service appropriate? No; Fluid consistency: Thin  Diet effective now                  EDUCATION NEEDS:  No education needs have been identified at this time  Skin:  Skin Assessment: Reviewed RN Assessment  Last BM:  9/11 type 6  Height:  Ht Readings from Last 1 Encounters:  10/18/20 5\' 5"  (1.651 m)   Weight:  Wt Readings from Last 1 Encounters:  03/23/21 67.5 kg   Ideal Body Weight:  56.8 kg  BMI:  Body mass index is 24.76 kg/m.  Estimated Nutritional Needs:  Kcal:  1600-1800 Protein:   80-90 grams Fluid:  >1.6L/d   05/23/21, MS, RD, LDN (she/her/hers) RD pager number and weekend/on-call pager number located in Amion.

## 2021-04-01 LAB — GLUCOSE, CAPILLARY
Glucose-Capillary: 121 mg/dL — ABNORMAL HIGH (ref 70–99)
Glucose-Capillary: 138 mg/dL — ABNORMAL HIGH (ref 70–99)
Glucose-Capillary: 93 mg/dL (ref 70–99)

## 2021-04-01 MED ORDER — PANTOPRAZOLE 2 MG/ML SUSPENSION
40.0000 mg | Freq: Every day | ORAL | Status: DC
  Administered 2021-04-02 – 2021-04-16 (×15): 40 mg
  Filled 2021-04-01 (×11): qty 20

## 2021-04-01 NOTE — Progress Notes (Signed)
PROGRESS NOTE    Charlene [name]  ONG:[id number] DOB: 1966/03/15 DOA: 08/26/2020 PCP: Patient, [name] (Inactive)    Brief Narrative:  This  55 yo F with 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  Subsequently on 2/10, she developed obtundation and respiratory arrest leading to intubation.  She has remained persistently obtunded, now has a trach and a PEG.  Hospital course complicated by persistent fever, Serratia pneumonia and now pre-orbital cellulitis. Patient began to improve with adjustments in hypertonicity medications after 3/25. Eventually started on provigil with improvement in alertness.  Evaluated by rehab physician who recommended increasing provigil dose further.  Subsequently patient has become more alert.  Most days she is able to track and with encouragement able to follow simple commands especially since partial obstruction glasses placed.    Significant events: 2/09 Admit for ICH 2/10 EVD placed, later intubated due to obtundation and respiratory arrest 2/11 MRI Brain, hypertonic saline d/c'd, EVD not working.  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 Patient has been writing her name and responding to written questions by writing back. Previous soft LUE splint apparently unavailable due to no stock.  Have asked for OT to reevaluate appropriateness of short left arm splint   Tracheostomy dependent PCCM/trach team following closely regarding candidacy for decannulation.  On 8/16 Dr. Tresa Moore discussed with patient's husband Charlene [name] regarding risks and benefits for decannulation and both Charlene [name] and family agree-reevaluated again on 9/6 with similar recommendations with family agreeing that risks outweighs benefit -Not yet a candidate for decannulation given lack of significant improvement thus far in terms of consistent cough strength and ability for her to cognitively understand and act on instructions to cough plus signs of impulsiveness and ongoing aspiration -Family now working with patient to see if she can cough on command in hopes that by performing voluntary coughing she would be a candidate for decannulation -Fortunately unable to obtain sitter from 9 PM to 7 AM to allow for removal of right hand mitt.  Specialty call light now at bedside and plan is to teach patient how to use the call light and hopes that if she has issues with the trach she will use the call light and not try to adjust trach ties or ambulate trach independently.  Plan is to train patient so we can eventually remove the mitt since this is a barrier to SNF placement.   Microcytic anemia multifactorial   Patient does have previous  hx. Of  iron deficiency Continues with low MCV and hemoglobin 11 Repeat anemia panel September 2022: Iron normal at 63.  B12 1243  normal folate   Pseudomonas UTI: Urine culture positive for pansensitive Pseudomonas Completed 7 days of Cipro   Constipation: Continue prn MiraLAX    Hypertension Continue Norvasc,  beta-blocker and hydralazine.   Dysphagia Continue dysphagia 1 with thin liquids Inconsistent oral intake therefore -continue nocturnal tube feedings   History of urinary retention Continue Cardura   Other problems: Enterobacter cystitis Urine culture with Enterobacter resistant to cefazolin, cefepime and Septra. Previously given 2 days of IV Rocephin and since no signs of sepsis was given a one-time dose of fosfomycin over the weekend. Purewick dc'd due to contributing to recurrent UTIs   Right upper extremity thrombophlebitis [name] -> age indeterminate SVT of right cephalic vein.  Neck Contracture/torticollis. Resolved.  Recurrent right knee pain/known ligamentous injury and chronic osteoarthritis s/p injection of Marcaine and Depomedrol 6/30 9/5 decreased Celebrex 200 mg to at bedtime only   ESBL positive Enterobacter UTI Has completed 5 days of meropenem. Contact isolation during hospitalization. continue Oxybutynin suspected bladder spasms   Klebsiella UTI Resistant to nitrofurantoin and ampicillin To minimize risk of recurrent UTI have asked that pure wick only be used at bedtime on a 10 PM to 8 AM schedule   Recurrent diarrhea Resolved.    Chronic pansinusitis/recurrent fevers 2/2 aspiration PNA;History of Serratia Pneumonia  Hospital Acquired Pneumonia:  Completed several courses of antibiotics Follow-up imaging including sinus CT consistent with chronic but stable and resolving sinusitis  DVT prophylaxis:Chronic bed bound. Code Status: Full code. Family Communication: Mother at bed side. Disposition Plan:  Status is: Inpatient  Remains inpatient appropriate because:Inpatient level of care appropriate due to severity of illness  Dispo: The patient is from: Home              Anticipated d/c is to: SNF              Patient currently is medically stable for discharge.   Difficult to place patient yes.  SNF trach capable-clinicals resubmitted to multiple  facilities as of 8/31.  Unable to decannulate 2/2 and lack of appropriate voluntary and involuntary cough response.             Consultants:  Neurology, neurosurgery,  palliative care, PCCM  Procedures:  Tracheostomy, PEG tube, EEG, echocardiogram  Antimicrobials:  Antibiotics: Vancomycin 2/12-2/15 Maxipime 2/12-2/15 Ancef 2/19-2/27 Vancomycin 2/20 x 1 dose Maxipime 2/27- 3/5 Vancomycin 3/1-3/3 Ancef 3/6-3/14 Cipro 3/20 4-3/30 Unasyn 3/30-4/4 Rocephin 4/11-4/12 Meropenem 4/12-4/17 Cipro 9/2 >> 9/8    Subjective: Patient was seen and examined at bedside.  Overnight events noted.  Patient is alert, awake, following partial commands.  Patient remains with right hand mittens.  Objective: Vitals:   04/01/21 0600 04/01/21 0838 04/01/21 1119 04/01/21 1238  BP: (!) 104/59 126/81  135/78  Pulse: 89 89  82  Resp:  '18 18 15  ' Temp:  98.4 F (36.9 C)  98.1 F (36.7 C)  TempSrc:  Axillary  Axillary  SpO2:  100%  99%  Weight:      Height:        Intake/Output Summary (Last 24 hours) at 04/01/2021 1302 Last data filed at 04/01/2021 0900 Gross per 24 hour  Intake 60 ml  Output --  Net 60 ml   Filed Weights   03/16/21 0500 03/17/21 0500 03/23/21 0346  Weight: 65.1 kg 61.5 kg 67.5 kg    Examination:  General exam: Appears comfortable, deconditioned, not in any acute distress.  Respiratory system: Clear to auscultation bilaterally, tracheostomy noted. Cardiovascular system: S1-S2 heard, regular rate  and rhythm, no murmur. Gastrointestinal system: Abdomen is soft, nontender, nondistended, BS+, PEG tube noted. Central nervous system: Alert and awake.  Left-sided negative. Extremities: No edema, no cyanosis, no clubbing. Skin: No rashes, lesions or ulcers Psychiatry: Not assessed.    Data Reviewed: I have personally reviewed following labs and imaging studies  CBC: No results for input(s): WBC, NEUTROABS, HGB, HCT, MCV, PLT in the last 168 hours.  Basic Metabolic  Panel: No results for input(s): NA, K, CL, CO2, GLUCOSE, BUN, CREATININE, CALCIUM, MG, PHOS in the last 168 hours.  GFR: Estimated Creatinine Clearance: 72.3 mL/min (by C-G formula based on SCr of 0.59 mg/dL). Liver Function Tests: No results for input(s): AST, ALT, ALKPHOS, BILITOT, PROT, ALBUMIN in the last 168 hours. No results for input(s): LIPASE, AMYLASE in the last 168 hours. No results for input(s): AMMONIA in the last 168 hours.  Coagulation Profile: No results for input(s): INR, PROTIME in the last 168 hours. Cardiac Enzymes: No results for input(s): CKTOTAL, CKMB, CKMBINDEX, TROPONINI in the last 168 hours. BNP (last 3 results) No results for input(s): PROBNP in the last 8760 hours. HbA1C: No results for input(s): HGBA1C in the last 72 hours. CBG: Recent Labs  Lab 03/29/21 0204 03/29/21 0625 03/30/21 0624 04/01/21 0249 04/01/21 0654  GLUCAP 138* 149* 159* 121* 138*   Lipid Profile: No results for input(s): CHOL, HDL, LDLCALC, TRIG, CHOLHDL, LDLDIRECT in the last 72 hours. Thyroid Function Tests: No results for input(s): TSH, T4TOTAL, FREET4, T3FREE, THYROIDAB in the last 72 hours. Anemia Panel: No results for input(s): VITAMINB12, FOLATE, FERRITIN, TIBC, IRON, RETICCTPCT in the last 72 hours. Sepsis Labs: No results for input(s): PROCALCITON, LATICACIDVEN in the last 168 hours.  No results found for this or any previous visit (from the past 240 hour(s)).   Radiology Studies: No results found.  Scheduled Meds:  acetaminophen  650 mg Per Tube Q6H   amLODipine  10 mg Per Tube Daily   atorvastatin  40 mg Per Tube Daily   baclofen  5 mg Per Tube QHS   carvedilol  25 mg Per Tube BID WC   celecoxib  200 mg Per Tube Daily   chlorhexidine gluconate (MEDLINE KIT)  15 mL Mouth Rinse BID   Chlorhexidine Gluconate Cloth  6 each Topical Daily   diclofenac Sodium  4 g Topical QID   doxazosin  2 mg Per Tube Daily   feeding supplement  237 mL Oral TID BM   feeding  supplement (JEVITY 1.5 CAL/FIBER)  750 mL Per Tube Q24H   feeding supplement (PROSource TF)  45 mL Per Tube Daily   free water  200 mL Per Tube Q4H   hydrALAZINE  37.5 mg Per Tube Q8H   mouth rinse  15 mL Mouth Rinse 5 X Daily   modafinil  200 mg Per Tube Daily   multivitamin with minerals  1 tablet Per Tube Daily   pantoprazole sodium  40 mg Per Tube Daily   Continuous Infusions:  sodium chloride Stopped (10/26/20 1445)     LOS: 217 days    Time spent: 25 mins    Sheldon Amara, MD [facility]   If 7PM-7AM, please contact night-coverage

## 2021-04-01 NOTE — Progress Notes (Signed)
Speech Language Pathology Treatment: Dysphagia;Passy Muir Speaking valve  Patient Details Name: Charlene [name] MRN: [id number] DOB: 1966/05/12 Today's Date: 04/01/2021 Time: 1435-1450 SLP Time Calculation (min) (ACUTE ONLY): 15 min  Assessment / Plan / Recommendation Clinical Impression  SLP followed up for PO tolerance and PMSV intervention. Pt being monitored for calorie count with nocturnal feeds. PMSV placed without difficulty, pt remains on trach collar. Pt very alert though without attempts for phonation or responsive to cueing. Provided oral care, pt with excess saliva in oral cavity, suctioned with yankauer. With water via cup pt with reduced labial seal, anterior spillage and primary loss of bolus. With tsp on puree pt with manipulation of bolus but difficulty with bolus cohesion and posterior propulsion, with majority also spilling from oral cavity at midline. Pt with some throat clearing and delayed cough. Will continue to closely monitor.    HPI HPI: Pt is 55 y/o female [facility] 2[facility] with L sided weakness and headaches. PMH includes anxiety, HTN. 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 S/p Right frontal ventricular catheter placement on 2/10, stopped functioning on 2/11.  MRI 2/11 shows right greater than left mesial temporal lobes / parahippocampal cortex, bilateral basal ganglia, and possibly the upper pons that are concerning for acute infarcts, most likely secondary to mass effect from the hemorrhage. ETT 2/10; trach/PEG 09/10/20.  Trach changed to #6 cuffless 3/11; #4 cuffless 5/4. Has had two MBS studies, the last on 7/8 - pt started on dysphagia 1, thin liquids. Trach replaced following self-decannulation 7/21, again on 8/23.      SLP Plan  Continue with current plan of care;Goals updated      Recommendations for follow up  therapy are one component of a multi-disciplinary discharge planning process, led by the attending physician.  Recommendations may be updated based on patient status, additional functional criteria and insurance authorization.    Recommendations  Diet recommendations: Thin liquid;Dysphagia 1 (puree) Liquids provided via: Cup;Straw Medication Administration: Via alternative means Supervision: Staff to assist with self feeding;Trained caregiver to feed patient;Full supervision/cueing for compensatory strategies Compensations: Minimize environmental distractions;Small sips/bites Postural Changes and/or Swallow Maneuvers: Seated upright 90 degrees      Patient may use Passy-Muir Speech Valve: During all waking hours (remove during sleep);During PO intake/meals PMSV Supervision: Intermittent         Oral Care Recommendations: Oral care BID Follow up Recommendations: Skilled Nursing facility SLP Visit Diagnosis: Dysphagia, oropharyngeal phase (R13.12) Plan: Continue with current plan of care;Goals updated       GO               Ardyth Gal MA, CCC-SLP Acute Rehabilitation Services    04/01/2021, 2:59 PM

## 2021-04-01 NOTE — Progress Notes (Signed)
Calorie Count Note  48 hour calorie count ordered.  Diet: Dysphagia 1 with thin liquids Supplements: Ensure Enlive TID  Day 1 (9/15): Breakfast: 25% (189 kcals, 9.5 grams protein) Lunch: 15% (146 kcals, 6.5 grams protein)  RD will follow-up tomorrow, 9/16, with remaining results of calorie count as no documentation regarding supplement consumption provided and dinner has yet to be delivered. Discussed pt and calorie count with RN.   Estimated Nutritional Needs:  Kcal: 1600-1800 Protein: 80-90 grams Fluid: >1.6L/d  Nutrition Dx: Inadequate oral intake related to inability to eat as evidenced by NPO status. -- progressing, pt now on po diet  Goal: Patient will meet greater than or equal to 90% of their needs -- progressing  Intervention:  -Continue 48 hour calorie count -Continue to encourage PO intake & assist as needed -Continue Ensure Enlive po TID -Continue MVI with minerals daily   Continue nocturnal TF via PEG: -Jevity 1.5 @ 43ml/hr for 10 hours from 2000-0600 ( total) -54ml PROSource TF daily Provides 1165 kcals, 58 grams protein, free water -- meets ~73% minimum estimated calorie and protein needs -Free water flushes per MD, currently Q4H (provides total free water w/ TF and flushes)     Eugene Gavia, MS, RD, LDN (she/her/hers) RD pager number and weekend/on-call pager number located in Amion.

## 2021-04-01 NOTE — Progress Notes (Signed)
Physical Therapy Treatment Patient Details Name: Charlene [name] MRN: [id number] DOB: Nov 19, 1965 Today's Date: 04/01/2021   History of Present Illness Pt is 54 y/o female presents to [facility] on 08/26/20 with L sided weakness and headaches. 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 s/p Right frontal ventricular catheter placement on 2/10, stopped functioning on 2/11.  MRI 2/11 shows right greater than left mesial temporal lobes / parahippocampal cortex, bilateral basal ganglia, and possibly the upper pons that are concerning for acute infarcts, most likely secondary to mass effect from the hemorrhage. ETT 2/10-2/24.  Pt with trach and PEG on 09/10/20. Hospital course complicated by persistent fever, Serratia pneumonia and now pre-orbital cellulitis.   Pt self decannulated 02/04/21, trach re-inserted by RT. Pt dislodged PEG, guided replacement 8/2.  PMH includes anxiety, HTN.    PT Comments    Pt alert, participative to the best of her physical and cognitive abilities.  We sat EOB for>20 mins details below.  My next session I would really like to lift her OOB to [facility] and get a table so that she can sit up and write or draw/paint (there are painting supplies in her room).  PT will continue to follow acutely for safe mobility progression.  Recommendations for follow up therapy are one component of a multi-disciplinary discharge planning process, led by the attending physician.  Recommendations may be updated based on patient status, additional functional criteria and insurance authorization.  Follow Up Recommendations  SNF     Equipment Recommendations  Wheelchair (measurements PT);Wheelchair cushion (measurements PT);Hospital bed;Other (comment) (18x18 tilt in space WC, hoyer lift)    Recommendations for Other Services       Precautions / Restrictions  Precautions Precautions: Fall Precaution Comments: peg and trach, R mitt Required Braces or Orthoses: Other Brace Other Brace: bil. PRAFOs, soft R knee extension splint and soft L elbow extension splint     Mobility  Bed Mobility Overal bed mobility: Needs Assistance Bed Mobility: Rolling;Sidelying to Sit;Sit to Sidelying Rolling: Max assist Sidelying to sit: Max assist   Sit to supine: Max assist   General bed mobility comments: Did ROM first today before EOB mobility to ensure less painful grimacing. Continues to have very stiff trunk, needing max assist at body and trunk to make transitions.    Transfers                    Ambulation/Gait                 Stairs             Wheelchair Mobility    Modified Rankin (Stroke Patients Only) Modified Rankin (Stroke Patients Only) Pre-Morbid Rankin Score: No symptoms Modified Rankin: Severe disability     Balance Overall balance assessment: Needs assistance Sitting-balance support: Feet unsupported;Single extremity supported Sitting balance-Leahy Scale: Poor Sitting balance - Comments: Worked EOB on trunk, head, neck rotation; perturbations in all directions attempting to elicit trunk righting reactions, following commands and generally sustaining an upright position for as long as possible. Postural control: Posterior lean;Left lateral lean (left lateral push)                                  Cognition Arousal/Alertness: Awake/alert Behavior During Therapy: Flat affect Overall Cognitive Status: Impaired/Different from baseline Area  of Impairment: Attention;Following commands                   Current Attention Level: Sustained   Following Commands: Follows one step commands inconsistently;Follows one step commands with increased time Safety/Judgement: Decreased awareness of safety;Decreased awareness of deficits Awareness: Intellectual          Exercises General  Exercises - Upper Extremity Shoulder Flexion: PROM;Left;10 reps Shoulder ABduction: PROM;Left;10 reps Elbow Extension: PROM;Left;10 reps General Exercises - Lower Extremity Ankle Circles/Pumps: AAROM;Both;10 reps Heel Slides: AAROM;Both;10 reps Other Exercises Other Exercises: pt was wet, cleaned/preformed pericare total assist    General Comments        Pertinent Vitals/Pain Pain Assessment: Faces Faces Pain Scale: Hurts even more Pain Location: bil knees Pain Descriptors / Indicators: Grimacing;Guarding Pain Intervention(s): Limited activity within patient's tolerance;Monitored during session;Repositioned    Home Living                      Prior Function            PT Goals (current goals can now be found in the care plan section) Acute Rehab PT Goals Patient Stated Goal: unable to state PT Goal Formulation: With family Time For Goal Achievement: 04/08/21 Potential to Achieve Goals: Fair Progress towards PT goals: Progressing toward goals    Frequency    Min 1X/week      PT Plan Current plan remains appropriate    Co-evaluation              AM-PAC PT "6 Clicks" Mobility   Outcome Measure  Help needed turning from your back to your side while in a flat bed without using bedrails?: Total Help needed moving from lying on your back to sitting on the side of a flat bed without using bedrails?: Total Help needed moving to and from a bed to a chair (including a wheelchair)?: Total Help needed standing up from a chair using your arms (e.g., wheelchair or bedside chair)?: Total Help needed to walk in hospital room?: Total Help needed climbing 3-5 steps with a railing? : Total 6 Click Score: 6    End of Session   Activity Tolerance: Patient tolerated treatment well Patient left: in bed;with call bell/phone within reach   PT Visit Diagnosis: Hemiplegia and hemiparesis;Other symptoms and signs involving the nervous system (R29.898);Other  abnormalities of gait and mobility (R26.89) Hemiplegia - Right/Left: Left Hemiplegia - dominant/non-dominant: Non-dominant Hemiplegia - caused by: Cerebral infarction;Nontraumatic intracerebral hemorrhage     Time: 2122-4825 PT Time Calculation (min) (ACUTE ONLY): 36 min  Charges:  $Therapeutic Activity: 23-37 mins                     Corinna Capra, PT, DPT  Acute Rehabilitation Ortho Tech Supervisor [phone] pager [phone]) [phone] office

## 2021-04-02 LAB — GLUCOSE, CAPILLARY
Glucose-Capillary: 123 mg/dL — ABNORMAL HIGH (ref 70–99)
Glucose-Capillary: 119 mg/dL — ABNORMAL HIGH (ref 70–99)
Glucose-Capillary: 113 mg/dL — ABNORMAL HIGH (ref 70–99)

## 2021-04-02 NOTE — Progress Notes (Signed)
Calorie Count Note  48 hour calorie count ordered.  Diet: Dysphagia 1 with thin liquids Supplements: Ensure Enlive TID  Day 1 (9/15): Breakfast: 25% (189 kcals, 9.5 grams protein) Lunch: 15% (146 kcals, 6.5 grams protein)  RD went to follow-up with remaining results of calorie count but no meal documentation was provided by RN staff. Based on yesterday's intake, recommend continue nocturnal TF at current rate.   Estimated Nutritional Needs:  Kcal: 1600-1800 Protein: 80-90 grams Fluid: >1.6L/d  Nutrition Dx: Inadequate oral intake related to inability to eat as evidenced by NPO status. -- progressing, pt now on po diet  Goal: Patient will meet greater than or equal to 90% of their needs -- progressing  Intervention:  -d/c 48 hour calorie count -Continue to encourage PO intake & assist as needed -Continue Ensure Enlive po TID -Continue MVI with minerals daily   Continue nocturnal TF via PEG: -Jevity 1.5 @ 71ml/hr for 10 hours from 2000-0600 ( total) -52ml PROSource TF daily Provides 1165 kcals, 58 grams protein, free water -- meets ~73% minimum estimated calorie and protein needs -Free water flushes per MD, currently Q4H (provides total free water w/ TF and flushes)     Eugene Gavia, MS, RD, LDN (she/her/hers) RD pager number and weekend/on-call pager number located in Amion.

## 2021-04-02 NOTE — Progress Notes (Signed)
Occupational Therapy Treatment Patient Details Name: Charlene [name] MRN: [id number] DOB: 11/12/1965 Today's Date: 04/02/2021   History of present illness Pt is 55 y/o female presents to [facility] on 08/26/20 with L sided weakness and headaches. 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 s/p Right frontal ventricular catheter placement on 2/10, stopped functioning on 2/11.  MRI 2/11 shows right greater than left mesial temporal lobes / parahippocampal cortex, bilateral basal ganglia, and possibly the upper pons that are concerning for acute infarcts, most likely secondary to mass effect from the hemorrhage. ETT 2/10-2/24.  Pt with trach and PEG on 09/10/20. Hospital course complicated by persistent fever, Serratia pneumonia and now pre-orbital cellulitis.   Pt self decannulated 02/04/21, trach re-inserted by RT. Pt dislodged PEG, guided replacement 8/2.  PMH includes anxiety, HTN.   OT comments  Pt  intermittently followed commands to use yaunker to suction mouth.  Worked with pt on use of soft touch call bell - she was able to activate it x 2 with cues.  She will require more work to consistently learn to use the call button.  PROM of Lt elbow performed and soft splint applied.    Recommendations for follow up therapy are one component of a multi-disciplinary discharge planning process, led by the attending physician.  Recommendations may be updated based on patient status, additional functional criteria and insurance authorization.    Follow Up Recommendations  SNF;Supervision/Assistance - 24 hour    Equipment Recommendations  None recommended by OT    Recommendations for Other Services      Precautions / Restrictions Precautions Precautions: Fall Precaution Comments: peg and trach, R mitt Required Braces or Orthoses: Other Brace Splint/Cast - Date  Prophylactic Dressing Applied (if applicable): 04/02/21 Other Brace: bil. PRAFOs, soft R knee extension splint and soft L elbow extension splint       Mobility Bed Mobility                    Transfers                      Balance                                           ADL either performed or assessed with clinical judgement   ADL                                         General ADL Comments: Pt able to use yaunker to suction mouth x 2 on command     Vision       Perception     Praxis      Cognition Arousal/Alertness: Awake/alert Behavior During Therapy: Flat affect Overall Cognitive Status: Impaired/Different from baseline Area of Impairment: Attention;Following commands;Problem solving                   Current Attention Level: Focused;Sustained Memory: Decreased short-term memory Following Commands: Follows one step commands inconsistently;Follows one step commands with increased time Safety/Judgement: Decreased awareness of safety;Decreased awareness of deficits   Problem Solving: Slow processing;Decreased initiation;Difficulty sequencing;Requires verbal cues;Requires tactile cues General Comments: Pt followed intermittent commands.  She was able to suction mouth on command x 2 using the yaunker, and was able to activate the soft touch call bell x2.  She was able to show me the soft touch x 1 when I asked her to show me how to get help.        Exercises Other Exercises Other Exercises: worked with pt on use of soft touch call bell - see comments in cognitive section Other Exercises: PROM performed to Lt elbow with ~50* elbow extension and soft splint applied.   Shoulder Instructions       General Comments      Pertinent Vitals/ Pain       Pain Assessment: Faces Faces Pain Scale: Hurts a little bit Pain Location: Lt elbow with PROM Pain Descriptors / Indicators: Grimacing;Guarding Pain  Intervention(s): Monitored during session;Limited activity within patient's tolerance  Home Living                                          Prior Functioning/Environment              Frequency  Min 1X/week        Progress Toward Goals  OT Goals(current goals can now be found in the care plan section)  Progress towards OT goals: Progressing toward goals  Acute Rehab OT Goals Patient Stated Goal: unable to state OT Goal Formulation: Patient unable to participate in goal setting Time For Goal Achievement: 04/16/21 Potential to Achieve Goals: Fair ADL Goals Pt Will Perform Eating: with set-up;with supervision;sitting;bed level Pt/caregiver will Perform Home Exercise Program: With written HEP provided;Both right and left upper extremity;Increased ROM Additional ADL Goal #1: Pt will wash face with setup assist in supported sitting using R hand. Additional ADL Goal #2: Pt will roll in bed with minimal assist to decrease burden of care for ADLs. Additional ADL Goal #3: Pt will demonstrate ability to give thumbs up with R hand with 90% accuracy within 1 minute. Additional ADL Goal #4: Pt will use soft touch call bell with min A  Plan Discharge plan remains appropriate;Frequency needs to be updated    Co-evaluation                 AM-PAC OT "6 Clicks" Daily Activity     Outcome Measure   Help from another person eating meals?: A Lot Help from another person taking care of personal grooming?: A Lot Help from another person toileting, which includes using toliet, bedpan, or urinal?: Total Help from another person bathing (including washing, rinsing, drying)?: Total Help from another person to put on and taking off regular upper body clothing?: A Lot Help from another person to put on and taking off regular lower body clothing?: Total 6 Click Score: 9    End of Session Equipment Utilized During Treatment: Oxygen  OT Visit Diagnosis: Muscle weakness  (generalized) (M62.81);Apraxia (R48.2);Cognitive communication deficit (R41.841);Hemiplegia and hemiparesis Symptoms and signs involving cognitive functions: Other Nontraumatic ICH Hemiplegia - Right/Left: Right Hemiplegia - caused by: Nontraumatic intracerebral hemorrhage   Activity Tolerance Patient tolerated treatment well   Patient Left in bed;with call bell/phone within reach   Nurse Communication Mobility status        Time: 2952-8413 OT Time Calculation (min): 29 min  Charges: OT General Charges $OT Visit: 1 Visit OT Treatments $Therapeutic Activity: 23-37 mins  Eber Jones., OTR/L Acute Rehabilitation Services Pager [phone] Office  [phone]   Jeani Hawking M 04/02/2021, 5:23 PM

## 2021-04-02 NOTE — Progress Notes (Signed)
PROGRESS NOTE    Charlene [name]  KZS:[id number] DOB: Nov 27, 1965 DOA: 08/26/2020 PCP: Charlene [name], [name] (Inactive)    Brief Narrative:  This  55 yo F with 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  Subsequently on 2/10, she developed obtundation and respiratory arrest leading to intubation.  She has remained persistently obtunded, now has a trach and a PEG.  Hospital course complicated by persistent fever, Serratia pneumonia and now pre-orbital cellulitis. Charlene [name] began to improve with adjustments in hypertonicity medications after 3/25. Eventually started on provigil with improvement in alertness.  Evaluated by rehab physician who recommended increasing provigil dose further.  Subsequently Charlene [name] has become more alert.  Most days she is able to track and with encouragement able to follow simple commands especially since partial obstruction glasses placed.    Significant events: 2/09 Admit for ICH 2/10 EVD placed, later intubated due to obtundation and respiratory arrest 2/11 MRI Brain, hypertonic saline d/c'd, EVD not working.  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 Charlene [name] has been writing her name and responding to written questions by writing back. Previous soft LUE splint apparently unavailable due to no stock.  Have asked for OT to reevaluate appropriateness of short left arm splint   Tracheostomy dependent PCCM/trach team following closely regarding candidacy for decannulation.  On 8/16 Dr. Tresa Moore discussed with Charlene [name]'s husband Charlene [name] regarding risks and benefits for decannulation and both Charlene [name] and family agree-reevaluated again on 9/6 with similar recommendations with family agreeing that risks outweighs benefit -Not yet a candidate for decannulation given lack of significant improvement thus far in terms of consistent cough strength and ability for her to cognitively understand and act on instructions to cough plus signs of impulsiveness and ongoing aspiration -Family now working with Charlene [name] to see if she can cough on command in hopes that by performing voluntary coughing she would be a candidate for decannulation -Fortunately unable to obtain sitter from 9 PM to 7 AM to allow for removal of right hand mitt.  Specialty call light now at bedside and plan is to teach Charlene [name] how to use the call light and hopes that if she has issues with the trach she will use the call light and not try to adjust trach ties or ambulate trach independently.  Plan is to train Charlene [name] so we can eventually remove the mitt since this is a barrier to SNF placement.   Microcytic anemia multifactorial   Charlene [name] does have previous  hx. Of  iron deficiency Continues with low MCV and hemoglobin 11 Repeat anemia panel September 2022: Iron normal at 63.  B12 1243 normal  folate   Pseudomonas UTI: Urine culture positive for pansensitive Pseudomonas Completed 7 days of Cipro   Constipation: Continue prn MiraLAX    Hypertension Continue Norvasc,  beta-blocker and hydralazine.   Dysphagia Continue dysphagia 1 with thin liquids Inconsistent oral intake therefore -continue nocturnal tube feedings   History of urinary retention Continue Cardura   Other problems: Enterobacter cystitis Urine culture with Enterobacter resistant to cefazolin, cefepime and Septra. Previously given 2 days of IV Rocephin and since no signs of sepsis was given a one-time dose of fosfomycin over the weekend. Purewick dc'd due to contributing to recurrent UTIs   Right upper extremity thrombophlebitis [name] -> age indeterminate SVT of right cephalic vein.  Neck Contracture/torticollis. Resolved.  Recurrent right knee pain/known ligamentous injury and chronic osteoarthritis s/p injection of Marcaine and Depomedrol 6/30 9/5 decreased Celebrex 200 mg to at bedtime only   ESBL positive Enterobacter UTI Has completed 5 days of meropenem. Contact isolation during hospitalization. continue Oxybutynin suspected bladder spasms   Klebsiella UTI Resistant to nitrofurantoin and ampicillin To minimize risk of recurrent UTI have asked that pure wick only be used at bedtime on a 10 PM to 8 AM schedule   Recurrent diarrhea Resolved.    Chronic pansinusitis/recurrent fevers 2/2 aspiration PNA;History of Serratia Pneumonia  Hospital Acquired Pneumonia:  Completed several courses of antibiotics Follow-up imaging including sinus CT consistent with chronic but stable and resolving sinusitis  DVT prophylaxis:Chronic bed bound. Code Status: Full code. Family Communication: Sister at bed side. Disposition Plan:  Status is: Inpatient  Remains inpatient appropriate because:Inpatient level of care appropriate due to severity of illness  Dispo: The Charlene [name] is from: Home              Anticipated d/c is to: SNF              Charlene [name] currently is medically stable for discharge.   Difficult to place Charlene [name] yes.  SNF trach capable-clinicals resubmitted to multiple  facilities as of 8/31.  Unable to decannulate 2/2 and lack of appropriate voluntary and involuntary cough response.             Consultants:  Neurology, neurosurgery,  palliative care, PCCM  Procedures:  Tracheostomy, PEG tube, EEG, echocardiogram  Antimicrobials:  Antibiotics: Vancomycin 2/12-2/15 Maxipime 2/12-2/15 Ancef 2/19-2/27 Vancomycin 2/20 x 1 dose Maxipime 2/27- 3/5 Vancomycin 3/1-3/3 Ancef 3/6-3/14 Cipro 3/20 4-3/30 Unasyn 3/30-4/4 Rocephin 4/11-4/12 Meropenem 4/12-4/17 Cipro 9/2 >> 9/8    Subjective: Charlene [name] was seen and examined at bedside.  Overnight events noted.  Charlene [name] is alert, awake, following partial commands.  She is writing back on the paper the answers to the question asked.  Objective: Vitals:   04/02/21 0339 04/02/21 0832 04/02/21 1003 04/02/21 1300  BP:   129/89 135/88  Pulse:   80 73  Resp:  _0 Temp:   (!) 97.2 F (36.2 C) (!) 97.2 F (36.2 C)  TempSrc:   Axillary Axillary  SpO2: 96%  100%   Weight:      Height:       No intake or output data in the 24 hours ending 04/02/21 1404  Filed Weights   03/16/21 0500 03/17/21 0500 03/23/21 0346  Weight: 65.1 kg 61.5 kg 67.5 kg    Examination:  General exam: Appears comfortable, deconditioned, not in any acute distress. Respiratory system: Clear to auscultation bilaterally, tracheostomy noted. Cardiovascular system: S1-S2 heard, regular rate and rhythm, no murmur. Gastrointestinal system: Abdomen is soft, nontender, nondistended, BS+, PEG  tube noted. Central nervous system: Alert and awake, left-sided neglect. Extremities: No edema, no cyanosis, no clubbing. Skin: No rashes, lesions or ulcers Psychiatry: Not assessed.    Data Reviewed: I have personally reviewed following labs and imaging studies  CBC: No results for input(s): WBC, NEUTROABS, HGB, HCT, MCV, PLT in the last 168 hours.  Basic Metabolic Panel: No results for input(s): NA, K, CL, CO2, GLUCOSE, BUN, CREATININE,  CALCIUM, MG, PHOS in the last 168 hours.  GFR: Estimated Creatinine Clearance: 72.3 mL/min (by C-G formula based on SCr of 0.59 mg/dL). Liver Function Tests: No results for input(s): AST, ALT, ALKPHOS, BILITOT, PROT, ALBUMIN in the last 168 hours. No results for input(s): LIPASE, AMYLASE in the last 168 hours. No results for input(s): AMMONIA in the last 168 hours.  Coagulation Profile: No results for input(s): INR, PROTIME in the last 168 hours. Cardiac Enzymes: No results for input(s): CKTOTAL, CKMB, CKMBINDEX, TROPONINI in the last 168 hours. BNP (last 3 results) No results for input(s): PROBNP in the last 8760 hours. HbA1C: No results for input(s): HGBA1C in the last 72 hours. CBG: Recent Labs  Lab 04/01/21 0654 04/01/21 1812 04/02/21 0316 04/02/21 0629 04/02/21 0756  GLUCAP 138* 93 123* 119* 113*   Lipid Profile: No results for input(s): CHOL, HDL, LDLCALC, TRIG, CHOLHDL, LDLDIRECT in the last 72 hours. Thyroid Function Tests: No results for input(s): TSH, T4TOTAL, FREET4, T3FREE, THYROIDAB in the last 72 hours. Anemia Panel: No results for input(s): VITAMINB12, FOLATE, FERRITIN, TIBC, IRON, RETICCTPCT in the last 72 hours. Sepsis Labs: No results for input(s): PROCALCITON, LATICACIDVEN in the last 168 hours.  No results found for this or any previous visit (from the past 240 hour(s)).   Radiology Studies: No results found.  Scheduled Meds:  acetaminophen  650 mg Per Tube Q6H   amLODipine  10 mg Per Tube Daily   atorvastatin  40 mg Per Tube Daily   baclofen  5 mg Per Tube QHS   carvedilol  25 mg Per Tube BID WC   celecoxib  200 mg Per Tube Daily   chlorhexidine gluconate (MEDLINE KIT)  15 mL Mouth Rinse BID   Chlorhexidine Gluconate Cloth  6 each Topical Daily   diclofenac Sodium  4 g Topical QID   doxazosin  2 mg Per Tube Daily   feeding supplement  237 mL Oral TID BM   feeding supplement (JEVITY 1.5 CAL/FIBER)  750 mL Per Tube Q24H   feeding supplement  (PROSource TF)  45 mL Per Tube Daily   free water  200 mL Per Tube Q4H   hydrALAZINE  37.5 mg Per Tube Q8H   mouth rinse  15 mL Mouth Rinse 5 X Daily   modafinil  200 mg Per Tube Daily   multivitamin with minerals  1 tablet Per Tube Daily   pantoprazole sodium  40 mg Per Tube Daily   Continuous Infusions:  sodium chloride Stopped (10/26/20 1445)     LOS: 218 days    Time spent: 25 mins  Hades Mathew, MD [facility]   If 7PM-7AM, please contact night-coverage

## 2021-04-03 NOTE — Progress Notes (Addendum)
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  Subsequently on 2/10, she developed obtundation and respiratory arrest leading to intubation.  She has remained persistently obtunded, now has a trach and a PEG.  Hospital course complicated by persistent fever, Serratia pneumonia and now pre-orbital cellulitis. Patient began to improve with adjustments in hypertonicity medications after 3/25. Eventually started on provigil with improvement in alertness.  Evaluated by rehab physician who recommended increasing provigil dose further.  Subsequently patient has become more alert.  Most days she is able to track and with encouragement able to follow simple commands especially since partial obstruction glasses placed.    Significant events: 2/09 Admit for ICH 2/10 EVD placed, later intubated due to obtundation and respiratory arrest 2/11 MRI Brain, hypertonic saline d/c'd, EVD not working.  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 Patient has been writing her name and responding to written questions by writing back. Previous soft LUE splint apparently unavailable due to no stock.  Have asked for OT to reevaluate appropriateness of short left arm splint   Tracheostomy dependent PCCM/trach team following closely regarding candidacy for decannulation.  On 8/16 Dr. Tresa Moore discussed with patient's husband [name] regarding risks and benefits for decannulation and both [name] and family agree-reevaluated again on 9/6 with similar recommendations with family agreeing that risks outweighs benefit -Not yet a candidate for decannulation given lack of significant improvement thus far in terms of consistent cough strength and ability for her to cognitively understand and act on instructions to cough plus signs of impulsiveness and ongoing aspiration -Family now working with patient to see if she can cough on command in hopes that by performing voluntary coughing she would be a candidate for decannulation -Fortunately unable to obtain sitter from 9 PM to 7 AM to allow for removal of right hand mitt.  Specialty call light now at bedside and plan is to teach patient how to use the call light and hopes that if she has issues with the trach she will use the call light and not try to adjust trach ties or ambulate trach independently.  Plan is to train patient so we can eventually remove the mitt since this is a barrier to SNF placement.   Microcytic anemia multifactorial   Patient does have previous  hx. Of  iron deficiency Continues with low MCV and hemoglobin 11 Repeat anemia panel September 2022: Iron normal at 63.  B12 1243 normal folate  Pseudomonas UTI: Urine culture positive for pansensitive Pseudomonas. Completed 7 days of Cipro.   Constipation: Continue prn MiraLAX    Hypertension Continue  Norvasc, beta-blocker and hydralazine.   Dysphagia Continue dysphagia 1 with thin liquids Inconsistent oral intake therefore -continue nocturnal tube feedings   History of urinary retention Continue Cardura   Other problems: Enterobacter cystitis Urine culture with Enterobacter resistant to cefazolin, cefepime and Septra. Previously given 2 days of IV Rocephin and since no signs of sepsis was given a one-time dose of fosfomycin over the weekend. Purewick dc'd due to contributing to recurrent UTIs   Right upper extremity thrombophlebitis [name] -> age indeterminate SVT of right cephalic vein.  Neck Contracture/torticollis. Resolved.  Recurrent right knee pain/known ligamentous injury and chronic osteoarthritis s/p injection of Marcaine and Depomedrol 6/30 9/5 decreased Celebrex 200 mg to at bedtime only   ESBL positive Enterobacter UTI Has completed 5 days of meropenem. Contact isolation during hospitalization. continue Oxybutynin suspected bladder spasms   Klebsiella UTI Resistant to nitrofurantoin and ampicillin To minimize risk of recurrent UTI have asked that pure wick only be used at bedtime on a 10 PM to 8 AM schedule   Recurrent diarrhea Resolved.    Chronic pansinusitis/recurrent fevers 2/2 aspiration PNA;History of Serratia Pneumonia  Hospital Acquired Pneumonia:  Completed several courses of antibiotics Follow-up imaging including sinus CT consistent with chronic but stable and resolving sinusitis  DVT prophylaxis:Chronic bed bound. Code Status: Full code. Family Communication: Sister at bed side. Disposition Plan:  Status is: Inpatient  Remains inpatient appropriate because:Inpatient level of care appropriate due to severity of illness  Dispo: The patient is from: Home              Anticipated d/c is to: SNF              Patient currently is medically stable for discharge.   Difficult to place patient yes.  SNF trach capable-clinicals resubmitted to  multiple facilities as of 8/31.  Unable to decannulate 2/2 and lack of appropriate voluntary and involuntary cough response.             Consultants:  Neurology, neurosurgery,  palliative care, PCCM  Procedures:  Tracheostomy, PEG tube, EEG, echocardiogram  Antimicrobials:  Antibiotics: Vancomycin 2/12-2/15 Maxipime 2/12-2/15 Ancef 2/19-2/27 Vancomycin 2/20 x 1 dose Maxipime 2/27- 3/5 Vancomycin 3/1-3/3 Ancef 3/6-3/14 Cipro 3/20 4-3/30 Unasyn 3/30-4/4 Rocephin 4/11-4/12 Meropenem 4/12-4/17 Cipro 9/2 >> 9/8    Subjective: Patient was seen and examined at bedside.  Overnight events noted.  Patient is alert, awake, following partial commands. She is writing back on the paper the answers to the question asked. Mother in law is at bedside,  states she is doing better.  Objective: Vitals:   04/03/21 0600 04/03/21 0747 04/03/21 0800 04/03/21 1309  BP: 114/72  138/75 119/72  Pulse: 81 77 87 82  Resp:  _0 Temp:   98.5 F (36.9 C)   TempSrc:   Oral   SpO2: 99% 99% 100% 100%  Weight:      Height:        Intake/Output Summary (Last 24 hours) at 04/03/2021 1357 Last data filed at 04/03/2021 1311 Gross per 24 hour  Intake 0 ml  Output --  Net 0 ml    Filed Weights   03/16/21 0500 03/17/21 0500 03/23/21 0346  Weight: 65.1 kg 61.5 kg 67.5 kg    Examination:  General exam: Appears comfortable, deconditioned, not in any acute distress. Respiratory system: Clear to  auscultation bilaterally, tracheostomy noted. Cardiovascular system: S1-S2 heard, regular rate and rhythm, no murmur. Gastrointestinal system: Abdomen is soft, nontender, nondistended, BS+, PEG tube noted. Central nervous system: Alert and awake, left-sided neglect. Extremities: No edema, no cyanosis, no clubbing. Skin: No rashes, lesions or ulcers Psychiatry: Not assessed.    Data Reviewed: I have personally reviewed following labs and imaging studies.  CBC: No results for input(s): WBC,  NEUTROABS, HGB, HCT, MCV, PLT in the last 168 hours.  Basic Metabolic Panel: No results for input(s): NA, K, CL, CO2, GLUCOSE, BUN, CREATININE, CALCIUM, MG, PHOS in the last 168 hours.  GFR: Estimated Creatinine Clearance: 72.3 mL/min (by C-G formula based on SCr of 0.59 mg/dL). Liver Function Tests: No results for input(s): AST, ALT, ALKPHOS, BILITOT, PROT, ALBUMIN in the last 168 hours. No results for input(s): LIPASE, AMYLASE in the last 168 hours. No results for input(s): AMMONIA in the last 168 hours.  Coagulation Profile: No results for input(s): INR, PROTIME in the last 168 hours. Cardiac Enzymes: No results for input(s): CKTOTAL, CKMB, CKMBINDEX, TROPONINI in the last 168 hours. BNP (last 3 results) No results for input(s): PROBNP in the last 8760 hours. HbA1C: No results for input(s): HGBA1C in the last 72 hours. CBG: Recent Labs  Lab 04/01/21 0654 04/01/21 1812 04/02/21 0316 04/02/21 0629 04/02/21 0756  GLUCAP 138* 93 123* 119* 113*   Lipid Profile: No results for input(s): CHOL, HDL, LDLCALC, TRIG, CHOLHDL, LDLDIRECT in the last 72 hours. Thyroid Function Tests: No results for input(s): TSH, T4TOTAL, FREET4, T3FREE, THYROIDAB in the last 72 hours. Anemia Panel: No results for input(s): VITAMINB12, FOLATE, FERRITIN, TIBC, IRON, RETICCTPCT in the last 72 hours. Sepsis Labs: No results for input(s): PROCALCITON, LATICACIDVEN in the last 168 hours.  No results found for this or any previous visit (from the past 240 hour(s)).   Radiology Studies: No results found.  Scheduled Meds:  acetaminophen  650 mg Per Tube Q6H   amLODipine  10 mg Per Tube Daily   atorvastatin  40 mg Per Tube Daily   baclofen  5 mg Per Tube QHS   carvedilol  25 mg Per Tube BID WC   celecoxib  200 mg Per Tube Daily   chlorhexidine gluconate (MEDLINE KIT)  15 mL Mouth Rinse BID   Chlorhexidine Gluconate Cloth  6 each Topical Daily   diclofenac Sodium  4 g Topical QID   doxazosin  2 mg  Per Tube Daily   feeding supplement  237 mL Oral TID BM   feeding supplement (JEVITY 1.5 CAL/FIBER)  750 mL Per Tube Q24H   feeding supplement (PROSource TF)  45 mL Per Tube Daily   free water  200 mL Per Tube Q4H   hydrALAZINE  37.5 mg Per Tube Q8H   mouth rinse  15 mL Mouth Rinse 5 X Daily   modafinil  200 mg Per Tube Daily   multivitamin with minerals  1 tablet Per Tube Daily   pantoprazole sodium  40 mg Per Tube Daily   Continuous Infusions:  sodium chloride Stopped (10/26/20 1445)     LOS: 219 days    Time spent: 25 mins  Ashaz Robling, MD [facility]   If 7PM-7AM, please contact night-coverage

## 2021-04-04 NOTE — Progress Notes (Signed)
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  Subsequently on 2/10, she developed obtundation and respiratory arrest leading to intubation.  She has remained persistently obtunded, now has a trach and a PEG.  Hospital course complicated by persistent fever, Serratia pneumonia and now pre-orbital cellulitis. Patient began to improve with adjustments in hypertonicity medications after 3/25. Eventually started on provigil with improvement in alertness.  Evaluated by rehab physician who recommended increasing provigil dose further.  Subsequently patient has become more alert.  Most days she is able to track and with encouragement able to follow simple commands especially since partial obstruction glasses placed.    Significant events: 2/09 Admit for ICH 2/10 EVD placed, later intubated due to obtundation and respiratory arrest 2/11 MRI Brain, hypertonic saline d/c'd, EVD not working.  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 Patient has been writing her name and responding to written questions by writing back. Previous soft LUE splint apparently unavailable due to no stock.  Have asked for OT to reevaluate appropriateness of short left arm splint   Tracheostomy dependent PCCM/trach team following closely regarding candidacy for decannulation.  On 8/16 Dr. Tresa Moore discussed with patient's husband [name] regarding risks and benefits for decannulation and both [name] and family agree-reevaluated again on 9/6 with similar recommendations with family agreeing that risks outweighs benefit -Not yet a candidate for decannulation given lack of significant improvement thus far in terms of consistent cough strength and ability for her to cognitively understand and act on instructions to cough plus signs of impulsiveness and ongoing aspiration -Family now working with patient to see if she can cough on command in hopes that by performing voluntary coughing she would be a candidate for decannulation -Fortunately unable to obtain sitter from 9 PM to 7 AM to allow for removal of right hand mitt.  Specialty call light now at bedside and plan is to teach patient how to use the call light and hopes that if she has issues with the trach she will use the call light and not try to adjust trach ties or ambulate trach independently.  Plan is to train patient so we can eventually remove the mitt since this is a barrier to SNF placement.   Microcytic anemia multifactorial   Patient does have previous  hx. Of  iron deficiency Continues with low MCV and hemoglobin 11 Repeat anemia panel September 2022: Iron normal at 63.  B12 1243 normal folate  Pseudomonas UTI: Urine culture positive for pansensitive Pseudomonas. Completed 7 days of Cipro.   Constipation: Continue prn MiraLAX    Hypertension Continue  Norvasc, beta-blocker and hydralazine.   Dysphagia Continue dysphagia 1 with thin liquids Inconsistent oral intake therefore -continue nocturnal tube feedings   History of urinary retention Continue Cardura   Other problems: Enterobacter cystitis Urine culture with Enterobacter resistant to cefazolin, cefepime and Septra. Previously given 2 days of IV Rocephin and since no signs of sepsis was given a one-time dose of fosfomycin over the weekend. Purewick dc'd due to contributing to recurrent UTIs   Right upper extremity thrombophlebitis [name] -> age indeterminate SVT of right cephalic vein.  Neck Contracture/torticollis. Resolved.  Recurrent right knee pain/known ligamentous injury and chronic osteoarthritis s/p injection of Marcaine and Depomedrol 6/30 9/5 decreased Celebrex 200 mg to at bedtime only   ESBL positive Enterobacter UTI Has completed 5 days of meropenem. Contact isolation during hospitalization. continue Oxybutynin suspected bladder spasms   Klebsiella UTI Resistant to nitrofurantoin and ampicillin To minimize risk of recurrent UTI have asked that pure wick only be used at bedtime on a 10 PM to 8 AM schedule   Recurrent diarrhea Resolved.    Chronic pansinusitis/recurrent fevers 2/2 aspiration PNA;History of Serratia Pneumonia  Hospital Acquired Pneumonia:  Completed several courses of antibiotics Follow-up imaging including sinus CT consistent with chronic but stable and resolving sinusitis  DVT prophylaxis:Chronic bed bound. Code Status: Full code. Family Communication: Sister at bed side. Disposition Plan:  Status is: Inpatient  Remains inpatient appropriate because:Inpatient level of care appropriate due to severity of illness  Dispo: The patient is from: Home              Anticipated d/c is to: SNF              Patient currently is medically stable for discharge.   Difficult to place patient yes.  SNF trach capable-clinicals resubmitted to  multiple facilities as of 8/31.  Unable to decannulate 2/2 and lack of appropriate voluntary and involuntary cough response.             Consultants:  Neurology, neurosurgery,  palliative care, PCCM  Procedures:  Tracheostomy, PEG tube, EEG, echocardiogram  Antimicrobials:  Antibiotics: Vancomycin 2/12-2/15 Maxipime 2/12-2/15 Ancef 2/19-2/27 Vancomycin 2/20 x 1 dose Maxipime 2/27- 3/5 Vancomycin 3/1-3/3 Ancef 3/6-3/14 Cipro 3/20 4-3/30 Unasyn 3/30-4/4 Rocephin 4/11-4/12 Meropenem 4/12-4/17 Cipro 9/2 >> 9/8    Subjective: Patient was seen and examined at bedside.  Overnight events noted.  Patient is alert, awake, following partial commands. She is writing back on the paper the answers to the question asked. Brother is at bedside,  he states she is doing better.  Objective: Vitals:   04/04/21 0454 04/04/21 0731 04/04/21 0925 04/04/21 1215  BP: 135/89 119/76  122/76  Pulse: 78 65 77 79  Resp: _0 Temp: 97.8 F (36.6 C) 98 F (36.7 C)  97.8 F (36.6 C)  TempSrc: Oral Oral  Oral  SpO2: 100% 100% 100% 100%  Weight:      Height:       No intake or output data in the 24 hours ending 04/04/21 1432   Filed Weights   03/16/21 0500 03/17/21 0500 03/23/21 0346  Weight: 65.1 kg 61.5 kg 67.5 kg    Examination:  General exam: Appears comfortable, deconditioned, not in any acute distress. Respiratory system: Clear to auscultation bilaterally, tracheostomy noted. Cardiovascular system: S1-S2 heard, regular rate and rhythm, no murmur.  Gastrointestinal system: Abdomen is soft, nontender, nondistended, BS+, PEG tube noted. Central nervous system: Alert, awake, left-sided neglect. Extremities: No edema, no cyanosis, no clubbing. Skin: No rashes, lesions or ulcers Psychiatry: Not assessed.    Data Reviewed: I have personally reviewed following labs and imaging studies.  CBC: No results for input(s): WBC, NEUTROABS, HGB, HCT, MCV, PLT in the last 168  hours.  Basic Metabolic Panel: No results for input(s): NA, K, CL, CO2, GLUCOSE, BUN, CREATININE, CALCIUM, MG, PHOS in the last 168 hours.  GFR: Estimated Creatinine Clearance: 72.3 mL/min (by C-G formula based on SCr of 0.59 mg/dL). Liver Function Tests: No results for input(s): AST, ALT, ALKPHOS, BILITOT, PROT, ALBUMIN in the last 168 hours. No results for input(s): LIPASE, AMYLASE in the last 168 hours. No results for input(s): AMMONIA in the last 168 hours.  Coagulation Profile: No results for input(s): INR, PROTIME in the last 168 hours. Cardiac Enzymes: No results for input(s): CKTOTAL, CKMB, CKMBINDEX, TROPONINI in the last 168 hours. BNP (last 3 results) No results for input(s): PROBNP in the last 8760 hours. HbA1C: No results for input(s): HGBA1C in the last 72 hours. CBG: Recent Labs  Lab 04/01/21 0654 04/01/21 1812 04/02/21 0316 04/02/21 0629 04/02/21 0756  GLUCAP 138* 93 123* 119* 113*   Lipid Profile: No results for input(s): CHOL, HDL, LDLCALC, TRIG, CHOLHDL, LDLDIRECT in the last 72 hours. Thyroid Function Tests: No results for input(s): TSH, T4TOTAL, FREET4, T3FREE, THYROIDAB in the last 72 hours. Anemia Panel: No results for input(s): VITAMINB12, FOLATE, FERRITIN, TIBC, IRON, RETICCTPCT in the last 72 hours. Sepsis Labs: No results for input(s): PROCALCITON, LATICACIDVEN in the last 168 hours.  No results found for this or any previous visit (from the past 240 hour(s)).   Radiology Studies: No results found.  Scheduled Meds:  acetaminophen  650 mg Per Tube Q6H   amLODipine  10 mg Per Tube Daily   atorvastatin  40 mg Per Tube Daily   baclofen  5 mg Per Tube QHS   carvedilol  25 mg Per Tube BID WC   celecoxib  200 mg Per Tube Daily   chlorhexidine gluconate (MEDLINE KIT)  15 mL Mouth Rinse BID   Chlorhexidine Gluconate Cloth  6 each Topical Daily   diclofenac Sodium  4 g Topical QID   doxazosin  2 mg Per Tube Daily   feeding supplement  237 mL  Oral TID BM   feeding supplement (JEVITY 1.5 CAL/FIBER)  750 mL Per Tube Q24H   feeding supplement (PROSource TF)  45 mL Per Tube Daily   free water  200 mL Per Tube Q4H   hydrALAZINE  37.5 mg Per Tube Q8H   mouth rinse  15 mL Mouth Rinse 5 X Daily   modafinil  200 mg Per Tube Daily   multivitamin with minerals  1 tablet Per Tube Daily   pantoprazole sodium  40 mg Per Tube Daily   Continuous Infusions:  sodium chloride Stopped (10/26/20 1445)     LOS: 220 days    Time spent: 15 mins  Shantice Menger, MD [facility]   If 7PM-7AM, please contact night-coverage

## 2021-04-05 LAB — CBC
HCT: 32.7 % — ABNORMAL LOW (ref 36.0–46.0)
Hemoglobin: 10.6 g/dL — ABNORMAL LOW (ref 12.0–15.0)
MCH: 25.2 pg — ABNORMAL LOW (ref 26.0–34.0)
MCHC: 32.4 g/dL (ref 30.0–36.0)
MCV: 77.7 fL — ABNORMAL LOW (ref 80.0–100.0)
Platelets: 250 10*3/uL (ref 150–400)
RBC: 4.21 MIL/uL (ref 3.87–5.11)
RDW: 14.6 % (ref 11.5–15.5)
WBC: 3.6 10*3/uL — ABNORMAL LOW (ref 4.0–10.5)
nRBC: 0 % (ref 0.0–0.2)

## 2021-04-05 LAB — GLUCOSE, CAPILLARY
Glucose-Capillary: 131 mg/dL — ABNORMAL HIGH (ref 70–99)
Glucose-Capillary: 121 mg/dL — ABNORMAL HIGH (ref 70–99)
Glucose-Capillary: 133 mg/dL — ABNORMAL HIGH (ref 70–99)

## 2021-04-05 LAB — COMPREHENSIVE METABOLIC PANEL
ALT: 58 U/L — ABNORMAL HIGH (ref 0–44)
AST: 28 U/L (ref 15–41)
Albumin: 3.5 g/dL (ref 3.5–5.0)
Alkaline Phosphatase: 83 U/L (ref 38–126)
Anion gap: 10 (ref 5–15)
BUN: 19 mg/dL (ref 6–20)
CO2: 28 mmol/L (ref 22–32)
Calcium: 9.4 mg/dL (ref 8.9–10.3)
Chloride: 102 mmol/L (ref 98–111)
Creatinine, Ser: 0.56 mg/dL (ref 0.44–1.00)
GFR, Estimated: >60 mL/min (ref >60)
Glucose, Bld: 129 mg/dL — ABNORMAL HIGH (ref 70–99)
Potassium: 3.8 mmol/L (ref 3.5–5.1)
Sodium: 140 mmol/L (ref 135–145)
Total Bilirubin: 0.4 mg/dL (ref 0.3–1.2)
Total Protein: 6.7 g/dL (ref 6.5–8.1)

## 2021-04-05 LAB — PHOSPHORUS: Phosphorus: 3.9 mg/dL (ref 2.5–4.6)

## 2021-04-05 LAB — MAGNESIUM: Magnesium: 1.9 mg/dL (ref 1.7–2.4)

## 2021-04-05 MED ORDER — MAGNESIUM HYDROXIDE 400 MG/5ML PO SUSP
30.0000 mL | Freq: Once | ORAL | Status: AC
  Administered 2021-04-05: 30 mL
  Filled 2021-04-05: qty 30

## 2021-04-05 NOTE — Plan of Care (Signed)
  Problem: Coping: Goal: Will identify appropriate support needs Outcome: Progressing   Problem: Health Behavior/Discharge Planning: Goal: Ability to manage health-related needs will improve Outcome: Progressing   Problem: Self-Care: Goal: Verbalization of feelings and concerns over difficulty with self-care will improve Outcome: Progressing Goal: Ability to communicate needs accurately will improve Outcome: Progressing   Problem: Nutrition: Goal: Risk of aspiration will decrease Outcome: Progressing   Problem: Intracerebral Hemorrhage Tissue Perfusion: Goal: Complications of Intracerebral Hemorrhage will be minimized Outcome: Progressing   Problem: Education: Goal: Knowledge of General Education information will improve Description: Including pain rating scale, medication(s)/side effects and non-pharmacologic comfort measures Outcome: Progressing   Problem: Health Behavior/Discharge Planning: Goal: Ability to manage health-related needs will improve Outcome: Progressing   Problem: Clinical Measurements: Goal: Ability to maintain clinical measurements within normal limits will improve Outcome: Progressing Goal: Will remain free from infection Outcome: Progressing Goal: Diagnostic test results will improve Outcome: Progressing Goal: Respiratory complications will improve Outcome: Progressing Goal: Cardiovascular complication will be avoided Outcome: Progressing   Problem: Coping: Goal: Level of anxiety will decrease Outcome: Progressing   Problem: Elimination: Goal: Will not experience complications related to bowel motility Outcome: Progressing Goal: Will not experience complications related to urinary retention Outcome: Progressing   Problem: Pain Managment: Goal: General experience of comfort will improve Outcome: Progressing   Problem: Skin Integrity: Goal: Risk for impaired skin integrity will decrease Outcome: Progressing   Problem: Intracerebral  Hemorrhage Tissue Perfusion: Goal: Complications of Intracerebral Hemorrhage will be minimized Outcome: Progressing   Problem: Ischemic Stroke/TIA Tissue Perfusion: Goal: Complications of ischemic stroke/TIA will be minimized Outcome: Progressing   

## 2021-04-05 NOTE — Progress Notes (Addendum)
[facility] PROGRESS NOTE  Charlene [name]:[id number] DOB: 04/20/1966 DOA: 08/26/2020 PCP: Patient, [name] (Inactive)    October 08, 2020     10/16/2020                        10/16/2020     10/18/2020                        10/20/2020      12/30/2020                   12/30/2020     01/04/2021                   02/24/2021 Viv really needs a teddy bear!   Status: Remains inpatient appropriate because:Altered mental status and Unsafe d/c plan: Requirement to discharge to trach capable SNF   Dispo: The patient is from: Home              Anticipated d/c is to: SNF trach capable-clinicals resubmitted to multiple facilities as of 8/31.  Unable to decannulate 2/2 and lack of appropriate voluntary and involuntary cough response              Patient currently is medically stable to d/c.   Difficult to place patient Yes         Barriers to DC: Continued requirement for right hand mitt to prevent self decannulation.  This is considered a restraint by SNFs. patient is not doing this by accident she is purposefully D cannulating herself.   Level of care: Telemetry Medical  Code Status: Full Family Communication: [name] [phone]) is her POA and legal contact.  Extensive conversation held with [name] by phone on 9/12.  Also updated patient mother as well as daughter Donnald Garre at bedside on 9/12 DVT prophylaxis: Chronically bedbound so Lovenox discontinued   Vaccination status: Unvaccinated-husband has agreed to [employer] vaccine but only if required by accepting facility  Micro Data:  2/9 COVID + Influenza negative 2/10 MRSA PCR negative 2/12 BCx 2 > no growth  2/19 BC 1/2 +Stap EPI, 1/2 no growth  10/07/2020 sputum positive for Serratia marcescens 4/8 Enterobacter cloaca + urine culture MDR sensitive only to imipenem and gentamicin 5/20 urine positive for Klebsiella resistant to ampicillin and Macrodantin 6/2 urine positive for Klebsiella resistant to ampicillin,  cefazolin and Macrodantin 7/4 urine positive for ESBL Klebsiella resistant to ampicillin, cefazolin and Macrodantin 7/28 urine positive for Enterobacter resistant to cefazolin, cefepime and Septra 9/2 urine positive for pansensitive Pseudomonas     Antimicrobials:  Cefepime 2/12 > 2/15 Vanc 2/12 > 2/15 10/08/2020 ciprofloxacin>>3/30 Unasyn 3/30 >> 4/02 Rocephin 4/11 > 4/12 Meropenem 4/12>4/17 Meropenem 5/20 through 5/22 Ceftriaxone 5/22 through 5/26 Meropenem 6/3 Ceftriaxone 6/4 through 6/10 Septra 7/4 through 7/7 Ceftriaxone 7/29 was 1 dose followed by 1 dose of fosfomycin Cipro 9/2 >>   HPI: 55 yo F with 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  Subsequently on 2/10, she developed obtundation and respiratory arrest leading to intubation.  She has remained persistently obtunded, now has a trach and a PEG.  Hospital course complicated by persistent fever, Serratia pneumonia and now pre-orbital cellulitis  As of 3/25 with adjustments in hypertonicity medications patient began to improve regarding hypertonicity.  Eventually started on provigil with improvements in alertness.  Evaluated by rehab physician who recommended increasing provigil dose further.  Subsequently patient has become more alert.  Most days she is able to track and with encouragement able to follow simple commands especially since partial obstruction glasses placed.  See progress note dictated 4/27 for expanded details.   Significant events: 2/09 Admit for ICH 2/10 EVD placed, later intubated due to obtundation and respiratory arrest 2/11 MRI Brain, hypertonic saline d/c'd, EVD not working.  Husband did not consent to PICC  2/13 EVD removed 2/17 [place] discussion with family, PMT 2/24 Bedside trach and PEG 3/1 Transferred to [facility]  Subjective: Awake.  Mother at bedside attempting to feed her pured diet.  Patient apparently does not  like the taste of what is on the tray because she continues to spit out.  Mother attempted to give her cranberry juice but patient unable to suck hard enough to get juice up through straw which is not typical for her.  Yogurt obtain from unit refrigerator.  Objective: Vitals:   04/05/21 0030 04/05/21 0341  BP: 125/83 115/75  Pulse: 71   Resp: (P) 16 17  Temp: 97.8 F (36.6 C) 98 F (36.7 C)  SpO2: 100% 99%   Filed Weights   03/16/21 0500 03/17/21 0500 03/23/21 0346  Weight: 65.1 kg 61.5 kg 67.5 kg    Exam:       Gen: Awake and alert, mother at bedside assisting with feeding Respiratory: #4.0 cuffless trach, trach collar with FiO2 21% at 5L; lung sounds clear to auscultation, normal pulse oximetry, no tracheal secretions. Cardiovascular: S1S2, no JVD or peripheral edema; regular pulse, normotensive Abdomen:  PEG /nocturnal tube feedings. LBM 9/12, nondistended, normoactive bowel sounds.  Leisurely does not like pured eggs or sausage Neurological: CN 2 -12 intact. Spontaneous purposeful movement of right upper extremity/strength 4/5.   RLE strength 2/5 with purposeful movement.  Persistent left-side neglect.   Psychiatric: Awake and alert.   Assessment/Plan: Acute problems: Multiple acute strokes  Right Thalamus ICH with IVH  Obstructive Hydrocephalus/left-sided neglect /Non TBI related hypertonicity -Continue HS baclofen 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 although when patient's mother wrote to the patient "which ear do you hear out of best" the patient wrote back "both".  At this point I do not suspect any acute hearing loss -Patient has been writing her name and responding to written questions by writing back. -Previous soft LUE splint apparently unavailable due to no stock.  Have asked for OT to reevaluate appropriateness of short left arm splint  Tracheostomy dependent -PCCM/trach team following closely regarding candidacy for decannulation. On 8/16 Dr. Dewald/PCCM  discussed with patient's husband [name] regarding risks and benefits for decannulation and both [name] and family agree-reevaluated again on 9/6 with similar recommendations with family agreeing that risk outweighs benefit -Not yet a candidate for decannulation given lack of significant improvement thus far in terms of consistent cough strength and ability for her to cognitively understand and act on instructions to cough plus signs of impulsiveness and ongoing aspiration -Family now working with patient to see if she can cough on command in hopes that by performing voluntary coughing she would be a candidate for decannulation -Fortunately unable to obtain sitter from 9 PM to 7 AM to allow for removal of right hand mitt.  Specialty call light now at bedside and plan is to teach patient how to use the soft touch call light and hopes that if she has issues with the trach she will use the call light and not try to adjust trach ties or ambulate trach independently.  Plan is  to train patient so we can eventually remove the mitt since this is a barrier to SNF placement  Microcytic anemia with known previous iron deficiency Continues with low MCV and hemoglobin 11 Repeat anemia panel September 2022: Iron normal at 63.  B12 1243 normal folate  Fever 2/2 Pseudomonas UTI Urine culture positive for pansensitive Pseudomonas Completed 7 days of Cipro  Constipation -Continue prn MiraLAX  -Last apparent documented bowel movement was over 1 week ago.  This may be influencing patient's appetite so I will give milk of magnesia per tube x1  Hypertensive emergency/Malignant HTN -Continue Norvasc, beta-blocker and hydralazine.  Dysphagia -Continue dysphagia 1 with thin liquids -Inconsistent oral intake therefore -continue nocturnal tube feedings  History of urinary retention -Continue Cardura    Other problems: Enterobacter cystitis -Urine culture with Enterobacter resistant to cefazolin, cefepime and  Septra. -Previously given 2 days of IV Rocephin and since no signs of sepsis was given a one-time dose of fosfomycin over the weekend -Purewick dc'd due to contributing to recurrent UTIs  Right upper extremity thrombophlebitis - [name] -> age indeterminate SVT of right cephalic vein  Neck Contracture/torticollis -- Resolved  Recurrent right knee pain/known ligamentous injury and chronic osteoarthritis -s/p injection of Marcaine and Depomedrol 6/30 -9/19 given blackbox warnings regarding stroke and MI we will go ahead and discontinue preadmission Celebrex  ESBL positive Enterobacter UTI -Has completed 5 days of meropenem -Contact isolation during hospitalization -continue Oxybutynin suspected bladder spasms  Klebsiella UTI -Resistant to nitrofurantoin and ampicillin -To minimize risk of recurrent UTI have asked that pure wick only be used at bedtime on a 10 PM to 8 AM schedule  Recurrent diarrhea -Resolved  Bacterial Conjunctivitis /periorbital cellulitis -Resolved  Chronic pansinusitis/recurrent fevers 2/2 aspiration PNA;History of Serratia Pneumonia  Hospital Acquired Pneumonia:  -Completed several courses of antibiotics -Follow-up imaging including sinus CT consistent with chronic but stable and resolving sinusitis   Anemia -Stable  HLD -Continue Lipitor  Data Reviewed: CBG: Recent Labs  Lab 04/01/21 0654 04/01/21 1812 04/02/21 0316 04/02/21 0629 04/02/21 0756  GLUCAP 138* 93 123* 119* 113*       Scheduled Meds:  acetaminophen  650 mg Per Tube Q6H   amLODipine  10 mg Per Tube Daily   atorvastatin  40 mg Per Tube Daily   baclofen  5 mg Per Tube QHS   carvedilol  25 mg Per Tube BID WC   celecoxib  200 mg Per Tube Daily   chlorhexidine gluconate (MEDLINE KIT)  15 mL Mouth Rinse BID   Chlorhexidine Gluconate Cloth  6 each Topical Daily   diclofenac Sodium  4 g Topical QID   doxazosin  2 mg Per Tube Daily   feeding supplement  237 mL Oral TID BM   feeding  supplement (JEVITY 1.5 CAL/FIBER)  750 mL Per Tube Q24H   feeding supplement (PROSource TF)  45 mL Per Tube Daily   free water  200 mL Per Tube Q4H   hydrALAZINE  37.5 mg Per Tube Q8H   mouth rinse  15 mL Mouth Rinse 5 X Daily   modafinil  200 mg Per Tube Daily   multivitamin with minerals  1 tablet Per Tube Daily   pantoprazole sodium  40 mg Per Tube Daily   Continuous Infusions:  sodium chloride Stopped (10/26/20 1445)    Principal Problem:   ICH (intracerebral hemorrhage) ([place]) Active Problems:   Acute hypoxemic respiratory failure ([place])   Tracheostomy dependent ([place])   Palliative care by specialist  Muscle hypertonicity   Oropharyngeal dysphagia   Poor prognosis   Aspiration pneumonia of right lower lobe (HCC)   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  Palliative care.   PCCM continues to follow.  No decannulation.   Procedures: Tracheostomy PEG tube EEG Echocardiogram  Antibiotics: Vancomycin 2/12-2/15 Maxipime 2/12-2/15 Ancef 2/19-2/27 Vancomycin 2/20 x 1 dose Maxipime 2/27- 3/5 Vancomycin 3/1-3/3 Ancef 3/6-3/14 Cipro 3/20 4-3/30 Unasyn 3/30-4/4 Rocephin 4/11-4/12 Meropenem 4/12-4/17 Cipro 9/2 >> 9/8   Time spent: 15 minutes    Erin Hearing ANP  [facility] 7 am - 330 pm/M-F for direct patient care and secure chat Please refer to [place] for contact info 221  Days

## 2021-04-06 DIAGNOSIS — R1010 Upper abdominal pain, unspecified: Secondary | ICD-10-CM

## 2021-04-06 LAB — GLUCOSE, CAPILLARY
Glucose-Capillary: 113 mg/dL — ABNORMAL HIGH (ref 70–99)
Glucose-Capillary: 131 mg/dL — ABNORMAL HIGH (ref 70–99)
Glucose-Capillary: 110 mg/dL — ABNORMAL HIGH (ref 70–99)
Glucose-Capillary: 106 mg/dL — ABNORMAL HIGH (ref 70–99)
Glucose-Capillary: 95 mg/dL (ref 70–99)
Glucose-Capillary: 86 mg/dL (ref 70–99)

## 2021-04-06 MED ORDER — ENOXAPARIN SODIUM 40 MG/0.4ML IJ SOSY
40.0000 mg | PREFILLED_SYRINGE | INTRAMUSCULAR | Status: DC
  Administered 2021-04-07 – 2021-04-15 (×9): 40 mg via SUBCUTANEOUS
  Filled 2021-04-06 (×9): qty 0.4

## 2021-04-06 NOTE — Progress Notes (Signed)
Nutrition Follow-up  DOCUMENTATION CODES:   Not applicable  INTERVENTION:  -Continue Ensure Enlive po TID -Continue MVI with minerals daily   Continue nocturnal TF via PEG: -Jevity 1.5 @ 79ml/hr for 10 hours from 2000-0600 (729ml total) -6ml PROSource TF daily Provides 1165 kcals, 58 grams protein, 543ml free water -- meets ~73% minimum estimated calorie and protein needs -Free water flushes per MD, currently 242ml Q4H (provides 1759ml total free water w/ TF and flushes)  NUTRITION DIAGNOSIS:   Inadequate oral intake related to inability to eat as evidenced by NPO status; diet advanced; progressing  GOAL:   Patient will meet greater than or equal to 90% of their needs; progressing  MONITOR:   PO intake, Supplement acceptance, Diet advancement, Labs, Weight trends, TF tolerance, I & O's, Skin  REASON FOR ASSESSMENT:   Consult, Ventilator Enteral/tube feeding initiation and management  ASSESSMENT:   Pt with PMH significant for HTN admitted with with 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  Subsequently on 2/10, pt developed obtundation and respiratory arrest leading to intubation. Pt is now s/p trach/PEG placement. Hospital course complicated by Serratia pneumonia, ESBL Enterobacter UTI and pre-orbital cellulitis.  2/10 s/p EVD placement and intubation for respiratory arrest 2/13 EVD removed 2/17 palliative care consult; family requesting tx to [facility] 2/24 s/p trach and PEG 3/1 tx to [facility] 3/11 trach changed to shiley #6 cuffless 4/08 pt w/ sepsis 2/2 UTI 4/18 28% ATC/5L 5/2 CIR reevaluated pt and determined she has not made enough improvement for admit; recommended SNF 5/8 trach changed to shiley #4 cuffless 5/23 CCM evaluated and determined pt stilll unable to decannulate due to variable mental status 6/3 trach exchanged, still shiley #4 cuffless 6/30 s/p R knee aspiration and  injection 7/3 trach exchanged, still shiley #4 cuffless 7/8 diet advanced to dysphagia 1 with thin liquids 7/17 trach exchanged, still shiley #4 cuffless 7/21 trach replaced after pt self-decannulated  8/01 tolerating PMV, minimal secretions 8/02 PEG replaced after pt removed 8/14 trach exchanged, still shiley #4 cuffless 8/23 trach reinserted (still shiley #4 cuffless) after pt decannulated herself   Pt continues on trach collar. Per MD, no plans for decannulation. Pt continues on a dysphagia 1 diet with thin liquids. Husband at bedside reports meal completion has been very varied with most intake at 30-50%. Pt currently has Ensure ordered with consumption of ~30-50% of each supplement. Plans to continue nocturnal tube feeds via PEG to ensure adequate nutrition is met. Plans for SNF that accepts trach upon discharge.   Labs and medications reviewed.   Diet Order:   Diet Order             DIET - DYS 1 Room service appropriate? No; Fluid consistency: Thin  Diet effective now                   EDUCATION NEEDS:   No education needs have been identified at this time  Skin:  Skin Assessment: Reviewed RN Assessment  Last BM:  9/18  Height:   Ht Readings from Last 1 Encounters:  10/18/20 $RemoveB'5\' 5"'FNKmloVX$  (1.651 m)    Weight:   Wt Readings from Last 1 Encounters:  03/23/21 67.5 kg    Ideal Body Weight:  56.8 kg  BMI:  Body mass index is 24.76 kg/m.  Estimated Nutritional Needs:   Kcal:  1600-1800  Protein:  80-90 grams  Fluid:  >1.6L/d  Corrin Parker, MS, RD, LDN RD pager  number/after hours weekend pager number on Amion.

## 2021-04-06 NOTE — Progress Notes (Addendum)
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  Subsequently on 2/10, she developed obtundation and respiratory arrest leading to intubation.  She has remained persistently obtunded, now has a trach and a PEG.  Hospital course complicated by persistent fever, Serratia pneumonia and now pre-orbital cellulitis  As of 3/25 with adjustments in hypertonicity medications patient began to improve regarding hypertonicity.  Eventually started on provigil with improvements in alertness.  Evaluated by rehab physician who recommended increasing provigil dose further.  Subsequently patient has become more alert.  Most days she is able to track and with encouragement able to follow simple commands especially since partial obstruction glasses placed.  See progress note dictated 4/27 for expanded details.   Significant events: 2/09 Admit for ICH 2/10 EVD placed, later intubated due to obtundation and respiratory arrest 2/11 MRI Brain, hypertonic saline d/c'd, EVD not working.  Husband did not consent to PICC  2/13 EVD removed 2/17 [place] discussion with family, PMT 2/24 Bedside trach and PEG 3/1 Transferred to [facility]  Subjective: Patient in bed sleeping but arousable, says no to headache or chest pain.  Moving right-sided extremities by herself arm more than leg.  Objective: Vitals:   04/06/21 0400 04/06/21 0826  BP:  117/77  Pulse:  77  Resp: 18 18  Temp: 98.4 F (36.9 C) 98.6 F (37 C)  SpO2:  95%   Filed Weights   03/16/21 0500 03/17/21 0500 03/23/21 0346  Weight: 65.1 kg 61.5 kg 67.5 kg    Exam:     Sleeping but easily arousable but confused, moves right-sided extremities spontaneously, no movement of the left side with left-sided hemineglect, trach and PEG in  place. [place].AT,PERRAL Supple Neck,No JVD,  Symmetrical Chest wall movement, Good air movement bilaterally, CTAB RRR,No Gallops, Rubs or new Murmurs, No Parasternal Heave +ve B.Sounds, Abd Soft, No tenderness, No organomegaly appriciated, No rebound - guarding or rigidity. No Cyanosis, Clubbing or edema    Assessment/Plan:   Multiple acute strokes  Right Thalamus ICH with IVH  Obstructive Hydrocephalus/left-sided neglect /Non TBI related hypertonicity -Continue HS baclofen 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 although when patient's mother wrote to the patient "which ear do you hear out of best" the patient wrote back "both".  At this point I do not suspect any acute hearing loss -Patient has been writing her name and responding to written questions by writing back. -Previous soft LUE splint apparently unavailable due to no stock.  Have asked for OT to reevaluate appropriateness of short left arm splint  Tracheostomy dependent   - PCCM/trach team following closely regarding candidacy for decannulation. PCCM DW Family, they are agreeing that risk outweighs benefit, no decannulation yet, R. Hand mittens to prevent from accidental pull out, PCCM managing Trach.   HLD  -Continue Lipitor  Microcytic anemia with known previous iron deficiency Continues with low MCV and hemoglobin 11, Repeat anemia panel September 2022: Iron normal at 63.  B12 1243 normal folate. Monitor  Multiple UTIs,  Pseudomonas, Enterobacter, Klebsiella, ESBL - all treated, monitor for retention and repeat Infection.          Constipation -Continue prn MiraLAX,  MOM, monitor   Hypertensive emergency/Malignant HTN -Continue Norvasc, beta-blocker and hydralazine.  Dysphagia -Continue dysphagia 1 with thin liquids, Inconsistent oral intake therefore -continue nocturnal tube feedings  History  of urinary retention -Continue Cardura, Purewick dc'd due to contributing to recurrent UTIs, can use PRN.   Right upper extremity  thrombophlebitis - [name] -> age indeterminate SVT of right cephalic vein March 6808 -  repeat US 04/06/21,  cleared by Neuro DR Erlinda Hong for Lovenox prophylactic dose to be started.  Neck Contracture/torticollis -- Resolved  Recurrent right knee pain/known ligamentous injury and chronic osteoarthritis - s/p injection of Marcaine and Depomedrol 6/30,  9/19 given blackbox warnings regarding stroke and MI we will go ahead and discontinue preadmission Celebrex.  Bacterial Conjunctivitis /periorbital cellulitis -Resolved.  Chronic pansinusitis/recurrent fevers 2/2 aspiration PNA;History of Serratia Pneumonia  Hospital Acquired      Pneumonia:  -Completed several courses of antibiotics, Follow-up imaging including sinus CT consistent with chronic but stable and resolving sinusitis.    Data Reviewed: CBG: Recent Labs  Lab 04/03/21 0554 04/05/21 0624 04/06/21 0216 04/06/21 0633 04/06/21 0815  GLUCAP 121* 133* 113* 131* 110*   Scheduled Meds:  acetaminophen  650 mg Per Tube Q6H   amLODipine  10 mg Per Tube Daily   atorvastatin  40 mg Per Tube Daily   baclofen  5 mg Per Tube QHS   carvedilol  25 mg Per Tube BID WC   chlorhexidine gluconate (MEDLINE KIT)  15 mL Mouth Rinse BID   Chlorhexidine Gluconate Cloth  6 each Topical Daily   diclofenac Sodium  4 g Topical QID   doxazosin  2 mg Per Tube Daily   feeding supplement  237 mL Oral TID BM   feeding supplement (JEVITY 1.5 CAL/FIBER)  750 mL Per Tube Q24H   feeding supplement (PROSource TF)  45 mL Per Tube Daily   free water  200 mL Per Tube Q4H   hydrALAZINE  37.5 mg Per Tube Q8H   mouth rinse  15 mL Mouth Rinse 5 X Daily   modafinil  200 mg Per Tube Daily   multivitamin with minerals  1 tablet Per Tube Daily   pantoprazole sodium  40 mg Per Tube Daily   Continuous Infusions:  sodium chloride Stopped (10/26/20 1445)    Principal Problem:   ICH (intracerebral hemorrhage) ([place]) Active Problems:   Acute hypoxemic respiratory failure (HCC)    Tracheostomy dependent (HCC)   Palliative care by specialist   Muscle hypertonicity   Oropharyngeal dysphagia   Poor prognosis   Aspiration pneumonia of right lower lobe (HCC)   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  Palliative care.   PCCM continues to follow.  No decannulation.   Procedures: Tracheostomy PEG tube EEG Echocardiogram  Antibiotics: Vancomycin 2/12-2/15 Maxipime 2/12-2/15 Ancef 2/19-2/27 Vancomycin 2/20 x 1 dose Maxipime 2/27- 3/5 Vancomycin 3/1-3/3 Ancef 3/6-3/14 Cipro 3/20 4-3/30 Unasyn 3/30-4/4 Rocephin 4/11-4/12 Meropenem 4/12-4/17 Cipro 9/2 >> 9/8   Status: Remains inpatient appropriate because:Altered mental status and Unsafe d/c plan: Requirement to discharge to trach capable SNF   Dispo: The patient is from: Home              Anticipated d/c is to: SNF trach capable-clinicals resubmitted to multiple facilities as of 8/31.  Unable to decannulate 2/2 and lack of appropriate voluntary and involuntary cough response              Patient currently  is medically stable to d/c.   Difficult to place patient Yes         Barriers to DC: Continued requirement for right hand mitt to prevent self decannulation.  This is considered a restraint by SNFs. patient is not doing this by accident she is purposefully D cannulating herself.   Level of care: Telemetry Medical  Code Status: Full Family Communication: husband Charlene [name] [phone]) is her POA and legal contact.  Extensive conversation held with husband Charlene [name] by phone on 9/12.  Also updated patient mother as well as daughter Donnald Garre at bedside on 9/12  DVT prophylaxis: SCDS added 04/06/21, Lovenox added, DW Dr Erlinda Hong Neuro  04/06/21.   Vaccination status: Unvaccinated-husband has agreed to [employer] vaccine but only if required by accepting facility   Micro Data:  2/9 COVID + Influenza negative 2/10 MRSA PCR negative 2/12 BCx 2 > no growth  2/19 BC 1/2 +Stap EPI, 1/2 no growth  10/07/2020 sputum positive for Serratia marcescens 4/8 Enterobacter cloaca + urine culture MDR sensitive only to imipenem and gentamicin 5/20 urine positive for Klebsiella resistant to ampicillin and Macrodantin 6/2 urine positive for Klebsiella resistant to ampicillin, cefazolin and Macrodantin 7/4 urine positive for ESBL Klebsiella resistant to ampicillin, cefazolin and Macrodantin 7/28 urine positive for Enterobacter resistant to cefazolin, cefepime and Septra 9/2 urine positive for pansensitive Pseudomonas     Antimicrobials:  Cefepime 2/12 > 2/15 Vanc 2/12 > 2/15 10/08/2020 ciprofloxacin>>3/30 Unasyn 3/30 >> 4/02 Rocephin 4/11 > 4/12 Meropenem 4/12>4/17 Meropenem 5/20 through 5/22 Ceftriaxone 5/22 through 5/26 Meropenem 6/3 Ceftriaxone 6/4 through 6/10 Septra 7/4 through 7/7 Ceftriaxone 7/29 was 1 dose followed by 1 dose of fosfomycin Cipro 9/2 >>  222  Days  Signature  Lala Lund M.D on 04/06/2021 at 4:55 PM   -  To page go to [email or web address]    Time spent 35 mins

## 2021-04-06 NOTE — Progress Notes (Signed)
NAME:  Charlene [name], MRN:  [id number], DOB:  Aug 27, 1965, LOS: 222 ADMISSION DATE:  08/26/2020, CONSULTATION DATE:  2/10 REFERRING MD:  Roda Shutters, CHIEF COMPLAINT:  headache   History of Present Illness:  55 year old female admitted with hypertensive R thalamic/basal ganglia ICH. Resultant obstructive hydrocephalus prompting EVD placement 2/10. Subsequently, 2/10 she had acute mental status change to obtundation and respiratory arrest resulting in intubation. Trach and Peg 2/24. Trach change to #6 cuffless on 3/11.   Hospital course complicated by Serratia pneumonia, ESBL UTI, preorbital cellulitis  Pertinent  Medical History  Hypertension, Medication non-compliance  Significant Hospital Events: Including procedures, antibiotic start and stop dates in addition to other pertinent events   2/09 Admit for ICH 2/10 EVD placed, later intubated due to obtundation and respiratory arrest 2/11 MRI Brain, hypertonic saline d/c'd, EVD not working.  Husband did not consent to PICC  2/13 EVD removed 2/17 GOC discussion with family, PMT 2/24 tracheostomy placed by Dr. Denese Killings, PEG placed by Dr. Sheliah Hatch 3/11 trach changed to Shiley 6 uncuffed 3/21 sputum culture with Serratia resistant to Ancef 4/18 28% ATC / 5L  5/08 downsized trach to 4 7/11 No significant secretions / no issues with trach 7/21 Self de-cannulated, trach replaced 7/25 ATC 21%/5L. Using PSV, some speech per SLP, eating 8/1 tolerating PMV, minimal secretions 8/22 Per SLP-- delayed cough, leaking fluids from her mouth, still needs pureed diet. Speech unintelligible, but able to attempt to say names with SLP. Has still had episodes where she is too sleepy to participate with therapies. Family at bedside today-- she thinks Heath is pretty awake today. 8/23 Pt pulled trach out, reinserted w/o difficulty 9/13 Long discussion with husband [name] at bedside.  Discussed risks and benefits of decannulation.  Explained that she remains aspiration  risk and even if decannulation were successful in the short-term, there would be some long-term risk of aspiration pneumonia and recurrent episode of mechanical ventilation.  After our discussion, he preferred that we not attempt decannulation. Also discussed our plan for accidental decannulation, he prefers that we replace tracheostomy in this scenario  Interim History / Subjective:   No events  Minimal to no secretions per RT RN reports mental status at baseline. Patient follows simple commands  Objective   Blood pressure 117/77, pulse 77, temperature 98.6 F (37 C), temperature source Axillary, resp. rate 18, height 5\' 5"  (1.651 m), weight 67.5 kg, last menstrual period 03/03/2014, SpO2 95 %, unknown if currently breastfeeding.    FiO2 (%):  [21 %] 21 %   Intake/Output Summary (Last 24 hours) at 04/06/2021 0928 Last data filed at 04/05/2021 1200 Gross per 24 hour  Intake 60 ml  Output --  Net 60 ml   Filed Weights   03/16/21 0500 03/17/21 0500 03/23/21 0346  Weight: 65.1 kg 61.5 kg 67.5 kg     Physical Exam: General: Chronically ill-appearing, no acute distress HENT: [place], AT, OP clear, MMM Neck: #6 Cuffless trach in place, c/d/i Eyes: Left deviated gaze, no scleral icterus Respiratory: Clear to auscultation bilaterally.  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 Mental status has improved.  -No plans for  decannulation in the future. Trach dependent -Continue routine trach care  -PT/OT   Pulmonary will follow weekly  Mechele Collin, M.D. [facility] Pulmonary/Critical Care Medicine 04/06/2021 9:35 AM   See Amion for personal pager For hours between 7 PM to 7 AM, please call Elink for urgent questions

## 2021-04-06 NOTE — Plan of Care (Signed)
  Problem: Coping: Goal: Will identify appropriate support needs Outcome: Progressing   Problem: Health Behavior/Discharge Planning: Goal: Ability to manage health-related needs will improve Outcome: Progressing   Problem: Self-Care: Goal: Verbalization of feelings and concerns over difficulty with self-care will improve Outcome: Progressing Goal: Ability to communicate needs accurately will improve Outcome: Progressing   Problem: Nutrition: Goal: Risk of aspiration will decrease Outcome: Progressing   Problem: Intracerebral Hemorrhage Tissue Perfusion: Goal: Complications of Intracerebral Hemorrhage will be minimized Outcome: Progressing   Problem: Education: Goal: Knowledge of General Education information will improve Description: Including pain rating scale, medication(s)/side effects and non-pharmacologic comfort measures Outcome: Progressing   Problem: Health Behavior/Discharge Planning: Goal: Ability to manage health-related needs will improve Outcome: Progressing   Problem: Clinical Measurements: Goal: Ability to maintain clinical measurements within normal limits will improve Outcome: Progressing Goal: Will remain free from infection Outcome: Progressing Goal: Diagnostic test results will improve Outcome: Progressing Goal: Respiratory complications will improve Outcome: Progressing Goal: Cardiovascular complication will be avoided Outcome: Progressing   Problem: Coping: Goal: Level of anxiety will decrease Outcome: Progressing   Problem: Elimination: Goal: Will not experience complications related to bowel motility Outcome: Progressing Goal: Will not experience complications related to urinary retention Outcome: Progressing   Problem: Pain Managment: Goal: General experience of comfort will improve Outcome: Progressing   Problem: Skin Integrity: Goal: Risk for impaired skin integrity will decrease Outcome: Progressing   Problem: Intracerebral  Hemorrhage Tissue Perfusion: Goal: Complications of Intracerebral Hemorrhage will be minimized Outcome: Progressing   Problem: Ischemic Stroke/TIA Tissue Perfusion: Goal: Complications of ischemic stroke/TIA will be minimized Outcome: Progressing   

## 2021-04-06 NOTE — Progress Notes (Signed)
CSW sent updated clinicals to [place] at [facility] in [place] via secure e-mail.  Edwin Dada, MSW, LCSW Transitions of Care  Clinical Social Worker II [phone]

## 2021-04-06 NOTE — Progress Notes (Signed)
ANTICOAGULATION CONSULT NOTE - Initial Consult  Pharmacy Consult for Enoxaparin Indication: VTE prophylaxis  No Known Allergies  Patient Measurements: Height: 5\' 5"  (165.1 cm) Weight: 67.5 kg (148 lb 13 oz) IBW/kg (Calculated) : 57  Vital Signs: Temp: 98.8 F (37.1 C) (09/20 1623) Temp Source: Axillary (09/20 1623) BP: 144/85 (09/20 1623) Pulse Rate: 77 (09/20 1623)  Labs: Recent Labs    04/05/21 0757  HGB 10.6*  HCT 32.7*  PLT 250  CREATININE 0.56    Estimated Creatinine Clearance: 72.3 mL/min (by C-G formula based on SCr of 0.56 mg/dL).   Medical History: Past Medical History:  Diagnosis Date   Hypertension     Assessment: 55 yo female admitted 08/26/2020 with R thalamic/basal ganglia ICH. Pharmacy consulted for enoxaparin for VTE prophylaxis after TRH discussed with Dr. 10/24/2020. CrCl > 30 ml/min.   Goal of Therapy:  Monitor platelets by anticoagulation protocol: Yes   Plan:  Start enoxparin 40mg  SQ daily    Roda Shutters, PharmD, BCPS Clinical Pharmacist 04/06/2021 6:20 PM

## 2021-04-07 ENCOUNTER — Inpatient Hospital Stay (HOSPITAL_COMMUNITY): Payer: Medicaid Other

## 2021-04-07 DIAGNOSIS — S0990XS Unspecified injury of head, sequela: Secondary | ICD-10-CM

## 2021-04-07 DIAGNOSIS — R609 Edema, unspecified: Secondary | ICD-10-CM

## 2021-04-07 DIAGNOSIS — F32A Depression, unspecified: Secondary | ICD-10-CM

## 2021-04-07 LAB — GLUCOSE, CAPILLARY: Glucose-Capillary: 128 mg/dL — ABNORMAL HIGH (ref 70–99)

## 2021-04-07 NOTE — Evaluation (Signed)
Occupational Therapy Evaluation Patient Details Name: Charlene [name] MRN: [id number] DOB: 1965/08/29 Today's Date: 04/07/2021   History of Present Illness Pt is 55 y/o female presents to [facility] on 08/26/20 with L sided weakness and headaches. 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 s/p Right frontal ventricular catheter placement on 2/10, stopped functioning on 2/11.  MRI 2/11 shows right greater than left mesial temporal lobes / parahippocampal cortex, bilateral basal ganglia, and possibly the upper pons that are concerning for acute infarcts, most likely secondary to mass effect from the hemorrhage. ETT 2/10-2/24.  Pt with trach and PEG on 09/10/20. Hospital course complicated by persistent fever, Serratia pneumonia and now pre-orbital cellulitis.   Pt self decannulated 02/04/21, trach re-inserted by RT. Pt dislodged PEG, guided replacement 8/2.  PMH includes anxiety, HTN.   Clinical Impression   Pt continues attempts with self feeding, but demonstrates difficulty initiating a swallow today.  She appears to possibly be apraxic with Rt UE when tossing ball with mother, but unsure if this is volitional, or unintentional.  Pt able to provide yes/no answers in response to written questions and able to indicate desire to attempt to use bedpan this date.       Recommendations for follow up therapy are one component of a multi-disciplinary discharge planning process, led by the attending physician.  Recommendations may be updated based on patient status, additional functional criteria and insurance authorization.   Follow Up Recommendations  SNF;Supervision/Assistance - 24 hour    Equipment Recommendations  None recommended by OT    Recommendations for Other Services       Precautions / Restrictions Precautions Precautions: Fall Precaution Comments: peg and  trach, R mitt Required Braces or Orthoses: Other Brace Splint/Cast - Date Prophylactic Dressing Applied (if applicable): 04/07/21 Other Brace: bil. PRAFOs, soft R knee extension splint and soft L elbow extension splint Restrictions Weight Bearing Restrictions: No      Mobility Bed Mobility Overal bed mobility: Needs Assistance Bed Mobility: Rolling Rolling: Max assist         General bed mobility comments: Pt requires assist for all aspects of rolling.  she will reach across with Rt UE to assist    Transfers                 General transfer comment: Pt moved into chair position with mother in the room with pt, head/neck rotated to the Right    Balance                                           ADL either performed or assessed with clinical judgement   ADL Overall ADL's : Needs assistance/impaired Eating/Feeding: Minimal assistance;Bed level Eating/Feeding Details (indicate cue type and reason): min A to drink from cup.  requires assist to place hand around cup and position the straw                     Toilet Transfer: Total assistance     Toileting - Clothing Manipulation Details (indicate cue type and reason): Pt incontinent of urine and was assisted with peri care in supine             Vision         Perception  Praxis      Pertinent Vitals/Pain Pain Assessment: Faces Faces Pain Scale: Hurts a little bit Pain Location: Lt elbow with PROM Pain Descriptors / Indicators: Grimacing;Guarding Pain Intervention(s): Monitored during session     Hand Dominance     Extremity/Trunk Assessment Upper Extremity Assessment Upper Extremity Assessment: RUE deficits/detail RUE Deficits / Details: Pt attempted to throw ball with mom.  She will hold ball attempt to initiate the task, but then not release the ball.  Unsure if this is volitional, or if there may be a component of apraxia - mother reports this is a change, and that she  usually throws the ball readily - NP notified RUE Coordination: decreased gross motor;decreased fine motor LUE Coordination: decreased gross motor;decreased fine motor   Lower Extremity Assessment Lower Extremity Assessment: Defer to PT evaluation       Communication     Cognition Arousal/Alertness: Awake/alert Behavior During Therapy: Flat affect Overall Cognitive Status: Impaired/Different from baseline Area of Impairment: Attention;Following commands;Problem solving                   Current Attention Level: Sustained;Focused   Following Commands: Follows one step commands inconsistently;Follows one step commands with increased time Safety/Judgement: Decreased awareness of safety;Decreased awareness of deficits Awareness: Intellectual Problem Solving: Slow processing;Decreased initiation;Difficulty sequencing;Requires verbal cues;Requires tactile cues General Comments: Pt able to nod that she needed to have a BM, she was able to write a "no" response to questions asked via writing.  She followed one step commands inconsistently.  Mother fed patient and she was noted to move food around in her mouth, but doesn't seem to initiate a swallow, then the food spills out of her mouth.  She blew bubbles when attempting to drink from cup, but when she was able to suck from the straw the fluid spilled out.  Asked pt's mother to discontinue feeding her for now - NP and SLP notified.  Pt also attempting to throw ball with mother, but repeatedly had dificulty releasing the ball (which mother reports is a change).  Unable to determine if this was volitional or possible due to apraxia   General Comments  [name] with mother present.  RN made aware    Exercises     Shoulder Instructions      Home Living                                          Prior Functioning/Environment                   OT Problem List:        OT  Treatment/Interventions:      OT Goals(Current goals can be found in the care plan section) Acute Rehab OT Goals Patient Stated Goal: unable to state OT Goal Formulation: With patient/family Time For Goal Achievement: 04/16/21 Potential to Achieve Goals: Fair  OT Frequency: Min 1X/week   Barriers to D/C:            Co-evaluation              AM-PAC OT "6 Clicks" Daily Activity     Outcome Measure Help from another person eating meals?: A Lot Help from another person taking care of personal grooming?: A Lot Help from another person toileting, which includes using toliet, bedpan, or urinal?: Total Help from another person bathing (including  washing, rinsing, drying)?: Total Help from another person to put on and taking off regular upper body clothing?: A Lot Help from another person to put on and taking off regular lower body clothing?: Total 6 Click Score: 9   End of Session Equipment Utilized During Treatment: Oxygen Nurse Communication: Mobility status  Activity Tolerance: Patient tolerated treatment well Patient left: in bed;with family/visitor present  OT Visit Diagnosis: Muscle weakness (generalized) (M62.81);Apraxia (R48.2);Cognitive communication deficit (R41.841);Hemiplegia and hemiparesis Symptoms and signs involving cognitive functions: Other Nontraumatic ICH Hemiplegia - Right/Left: Right Hemiplegia - caused by: Nontraumatic intracerebral hemorrhage                Time: 0921-1018 OT Time Calculation (min): 57 min Charges:  OT General Charges $OT Visit: 1 Visit OT Treatments $Therapeutic Activity: 53-67 mins  Eber Jones., OTR/L Acute Rehabilitation Services Pager [phone] Office [phone]   Jeani Hawking M 04/07/2021, 11:07 AM

## 2021-04-07 NOTE — Progress Notes (Signed)
[facility] PROGRESS NOTE  Charlene [name] EUM:[id number] DOB: 29-Mar-1966 DOA: 08/26/2020 PCP: Patient, [name] (Inactive)    October 08, 2020     10/16/2020                        10/16/2020     10/18/2020                        10/20/2020      12/30/2020                   12/30/2020     01/04/2021                   02/24/2021 Viv really needs a teddy bear!   Status: Remains inpatient appropriate because:Altered mental status and Unsafe d/c plan: Requirement to discharge to trach capable SNF   Dispo: The patient is from: Home              Anticipated d/c is to: SNF trach capable-clinicals resubmitted to multiple facilities as of 8/31.  Unable to decannulate 2/2 and lack of appropriate voluntary and involuntary cough response              Patient currently is medically stable to d/c.   Difficult to place patient Yes         Barriers to DC: Continued requirement for right hand mitt to prevent self decannulation.  This is considered a restraint by SNFs. patient is not doing this by accident she is purposefully D cannulating herself.   Level of care: Telemetry Medical  Code Status: Full Family Communication: Extensive conversation held with husband [name] by phone on 9/21   DVT prophylaxis: Chronically bedbound so Lovenox discontinued   Vaccination status: Unvaccinated-husband has agreed to [employer] vaccine but only if required by accepting facility  Micro Data:  2/9 COVID + Influenza negative 2/10 MRSA PCR negative 2/12 BCx 2 > no growth  2/19 BC 1/2 +Stap EPI, 1/2 no growth  10/07/2020 sputum positive for Serratia marcescens 4/8 Enterobacter cloaca + urine culture MDR sensitive only to imipenem and gentamicin 5/20 urine positive for Klebsiella resistant to ampicillin and Macrodantin 6/2 urine positive for Klebsiella resistant to ampicillin, cefazolin and Macrodantin 7/4 urine positive for ESBL Klebsiella resistant to ampicillin, cefazolin and Macrodantin 7/28 urine positive for  Enterobacter resistant to cefazolin, cefepime and Septra 9/2 urine positive for pansensitive Pseudomonas     Antimicrobials:  Cefepime 2/12 > 2/15 Vanc 2/12 > 2/15 10/08/2020 ciprofloxacin>>3/30 Unasyn 3/30 >> 4/02 Rocephin 4/11 > 4/12 Meropenem 4/12>4/17 Meropenem 5/20 through 5/22 Ceftriaxone 5/22 through 5/26 Meropenem 6/3 Ceftriaxone 6/4 through 6/10 Septra 7/4 through 7/7 Ceftriaxone 7/29 was 1 dose followed by 1 dose of fosfomycin Cipro 9/2 >>   HPI: 55 yo F with 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  Subsequently on 2/10, she developed obtundation and respiratory arrest leading to intubation.  She has remained persistently obtunded, now has a trach and a PEG.  Hospital course complicated by persistent fever, Serratia pneumonia and now pre-orbital cellulitis  As of 3/25 with adjustments in hypertonicity medications patient began to improve regarding hypertonicity.  Eventually started on provigil with improvements in alertness.  Evaluated by rehab physician who recommended increasing provigil dose further.  Subsequently patient has become more alert.  Most days she is able to track and with encouragement able to follow simple  commands especially since partial obstruction glasses placed.  See progress note dictated 4/27 for expanded details.   Significant events: 2/09 Admit for ICH 2/10 EVD placed, later intubated due to obtundation and respiratory arrest 2/11 MRI Brain, hypertonic saline d/c'd, EVD not working.  Husband did not consent to PICC  2/13 EVD removed 2/17 [place] discussion with family, PMT 2/24 Bedside trach and PEG 3/1 Transferred to [facility]  Subjective: Awake but continues with flatter affect than baseline with limited eye contact.  Spitting out her food when mother feeds her.  Therapy notified me that she was not participating at her baseline.  Objective: Vitals:   04/07/21 0341  04/07/21 0759  BP:    Pulse: 70 93  Resp: 18 16  Temp:  98.7 F (37.1 C)  SpO2: 98% 100%   Filed Weights   03/16/21 0500 03/17/21 0500 03/23/21 0346  Weight: 65.1 kg 61.5 kg 67.5 kg    Exam:       Gen: Awake and alert but with flat affect and limited engagement, mother at bedside assisting with feeding patient is spitting out her food with every mouthful. Respiratory: #4.0 cuffless trach, trach collar with FiO2 21% at 5L; lung sounds clear to auscultation, no tracheal secretions, bubbling noted at mouth with patient using tongue to push out food and blowout food normal pulse oximetry, no tracheal secretions. Cardiovascular: S1S2, also is regular, she remains normotensive, no edema Abdomen:  PEG /nocturnal tube feedings. LBM 9/20, nondistended, normoactive bowel sounds.   Neurological: CN 2 -12 intact. Spontaneous purposeful movement of right upper extremity/strength 4/5.   RLE strength 2/5 with purposeful movement.  Persistent left-side neglect.   Psychiatric: Awake and alert although has flat depressed affect and is not engaging in she has previously including with therapies and appears to not want to eat.  Assessment/Plan: Acute problems: Multiple acute strokes  Right Thalamus ICH with IVH  Obstructive Hydrocephalus/left-sided neglect /Non TBI related hypertonicity -Continue HS baclofen -Continues with LUE neglect requiring soft splint.  Moves both LEs right much greater than left -Change in mental status today which is likely consistent with depression but as a precaution obtain MRI to rule out acute neurological event.  Husband [name] updated by phone and mother is at bedside and was updated. -Husband would like to participate with therapies.  He is available every Tuesday at 11 AM.  Message sent to therapy team  Tracheostomy dependent -PCCM/trach team following closely regarding candidacy for decannulation. -Husband and family agree risk outweighs benefit to  decannulate -Continues to require mitten to prevent accidental decannulation-teaching patient how to use soft touch call light to see if this will aid in ability to remove mitt  Microcytic anemia with known previous iron deficiency Continues with low MCV and hemoglobin 11-repeat anemia panel in September normal  Fever 2/2 Pseudomonas UTI/history of ESBL Klebsiella UTI Completed 7 days of Cipro Continue contact isolation  Constipation -Continue prn MiraLAX -7 days between last bowel movement as of 9/19 so was given MOM x 1 and subsequently had a bowel movement on 9/20.  Today 9/21 was on bedpan upon my entry to the room.  Hypertensive emergency/Malignant HTN -Continue Norvasc, beta-blocker and hydralazine.  Blood pressure well controlled  Dysphagia -Continue dysphagia 1 with thin liquids-Inconsistent oral intake therefore -continue nocturnal tube feedings-given patient bubbling orally and pushing food out speech therapy has evaluated her and notes that she does this occasionally and this does not appear to be worsening of swallowing abilities but a sign of her  underlying depression  History of urinary retention -Continue Cardura  Depression -Discussed current symptoms with patient's husband noting that it is perfectly understandable that patient would be sad and depressed -Explained how SSRIs work and this would be my recommendation for a medication.  Both he and Viv historically do not like to take medications he will think about it and discuss with the family given her symptoms seem to be somewhat severe and be influencing her ability to participate with therapies and her desire to eat  Right upper extremity thrombophlebitis - [name] -> age indeterminate SVT of right cephalic vein-RUE ultrasound pending-as a precaution attending physician on 9/20 opted to initiate pharmacologic VTE prophylaxis after clearing with NS Xu  Neck Contracture/torticollis -- Resolved  Recurrent right knee  pain/known ligamentous injury and chronic osteoarthritis -s/p injection of Marcaine and Depomedrol 6/30 -9/19 given blackbox warnings regarding stroke and MI dc'd preadmission Celebrex  Chronic pansinusitis/recurrent fevers 2/2 aspiration PNA;History of Serratia Pneumonia  Hospital Acquired Pneumonia:  -Completed several courses of antibiotics -Follow-up imaging including sinus CT consistent with chronic but stable and resolving sinusitis  HLD -Continue Lipitor  Data Reviewed: CBG: Recent Labs  Lab 04/06/21 0633 04/06/21 0815 04/06/21 1125 04/06/21 1627 04/06/21 1952  GLUCAP 131* 110* 106* 95 86       Scheduled Meds:  acetaminophen  650 mg Per Tube Q6H   amLODipine  10 mg Per Tube Daily   atorvastatin  40 mg Per Tube Daily   baclofen  5 mg Per Tube QHS   carvedilol  25 mg Per Tube BID WC   chlorhexidine gluconate (MEDLINE KIT)  15 mL Mouth Rinse BID   Chlorhexidine Gluconate Cloth  6 each Topical Daily   diclofenac Sodium  4 g Topical QID   doxazosin  2 mg Per Tube Daily   enoxaparin (LOVENOX) injection  40 mg Subcutaneous Q24H   feeding supplement  237 mL Oral TID BM   feeding supplement (JEVITY 1.5 CAL/FIBER)  750 mL Per Tube Q24H   feeding supplement (PROSource TF)  45 mL Per Tube Daily   free water  200 mL Per Tube Q4H   hydrALAZINE  37.5 mg Per Tube Q8H   mouth rinse  15 mL Mouth Rinse 5 X Daily   modafinil  200 mg Per Tube Daily   multivitamin with minerals  1 tablet Per Tube Daily   pantoprazole sodium  40 mg Per Tube Daily   Continuous Infusions:  sodium chloride Stopped (10/26/20 1445)    Principal Problem:   ICH (intracerebral hemorrhage) ([place]) Active Problems:   Acute hypoxemic respiratory failure (HCC)   Tracheostomy dependent (HCC)   Palliative care by specialist   Muscle hypertonicity   Oropharyngeal dysphagia   Poor prognosis   Aspiration pneumonia of right lower lobe (HCC)   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  Palliative care.   PCCM continues to follow.  No decannulation.   Procedures: Tracheostomy PEG tube EEG Echocardiogram  Antibiotics: Vancomycin 2/12-2/15 Maxipime 2/12-2/15 Ancef 2/19-2/27 Vancomycin 2/20 x 1 dose Maxipime 2/27- 3/5 Vancomycin 3/1-3/3 Ancef 3/6-3/14 Cipro 3/20 4-3/30 Unasyn 3/30-4/4 Rocephin 4/11-4/12 Meropenem 4/12-4/17 Cipro 9/2 >> 9/8   Time spent: 15 minutes    Erin Hearing ANP  [facility] 7 am -  330 pm/M-F for direct patient care and secure chat Please refer to Amion for contact info 223  Days

## 2021-04-07 NOTE — Plan of Care (Signed)
  Problem: Coping: Goal: Will identify appropriate support needs Outcome: Progressing   Problem: Health Behavior/Discharge Planning: Goal: Ability to manage health-related needs will improve Outcome: Progressing   Problem: Self-Care: Goal: Verbalization of feelings and concerns over difficulty with self-care will improve Outcome: Progressing Goal: Ability to communicate needs accurately will improve Outcome: Progressing   Problem: Nutrition: Goal: Risk of aspiration will decrease Outcome: Progressing   Problem: Intracerebral Hemorrhage Tissue Perfusion: Goal: Complications of Intracerebral Hemorrhage will be minimized Outcome: Progressing   Problem: Education: Goal: Knowledge of General Education information will improve Description: Including pain rating scale, medication(s)/side effects and non-pharmacologic comfort measures Outcome: Progressing   Problem: Health Behavior/Discharge Planning: Goal: Ability to manage health-related needs will improve Outcome: Progressing   Problem: Clinical Measurements: Goal: Ability to maintain clinical measurements within normal limits will improve Outcome: Progressing Goal: Will remain free from infection Outcome: Progressing Goal: Diagnostic test results will improve Outcome: Progressing Goal: Respiratory complications will improve Outcome: Progressing Goal: Cardiovascular complication will be avoided Outcome: Progressing   Problem: Coping: Goal: Level of anxiety will decrease Outcome: Progressing   Problem: Elimination: Goal: Will not experience complications related to bowel motility Outcome: Progressing Goal: Will not experience complications related to urinary retention Outcome: Progressing   Problem: Pain Managment: Goal: General experience of comfort will improve Outcome: Progressing   Problem: Skin Integrity: Goal: Risk for impaired skin integrity will decrease Outcome: Progressing   Problem: Intracerebral  Hemorrhage Tissue Perfusion: Goal: Complications of Intracerebral Hemorrhage will be minimized Outcome: Progressing   Problem: Ischemic Stroke/TIA Tissue Perfusion: Goal: Complications of ischemic stroke/TIA will be minimized Outcome: Progressing   

## 2021-04-07 NOTE — Progress Notes (Signed)
Upper extremity venous RT study completed.   Please see CV Proc for preliminary results.   Hazeline Charnley, RDMS, RVT  

## 2021-04-08 ENCOUNTER — Inpatient Hospital Stay (HOSPITAL_COMMUNITY): Payer: Medicaid Other

## 2021-04-08 DIAGNOSIS — R4182 Altered mental status, unspecified: Secondary | ICD-10-CM | POA: Insufficient documentation

## 2021-04-08 LAB — GLUCOSE, CAPILLARY
Glucose-Capillary: 133 mg/dL — ABNORMAL HIGH (ref 70–99)
Glucose-Capillary: 166 mg/dL — ABNORMAL HIGH (ref 70–99)

## 2021-04-08 IMAGING — CT CT HEAD W/O CM
4 series · 17 of 47 positions shown, 19 images · non-contrast
Comparison: [DATE]

CLINICAL DATA: Mental status change, persistent or worsening past
strokes during this admission

EXAM:
CT HEAD WITHOUT CONTRAST
TECHNIQUE: Contiguous axial images were obtained from the base of the skull
through the vertex without intravenous contrast.

[Series 3: head without · axial · non-contrast · 0.41mm/px · z∈[-45,+75]mm · 7 of 32 slices shown, 9 images]
[im 4/32  brain]
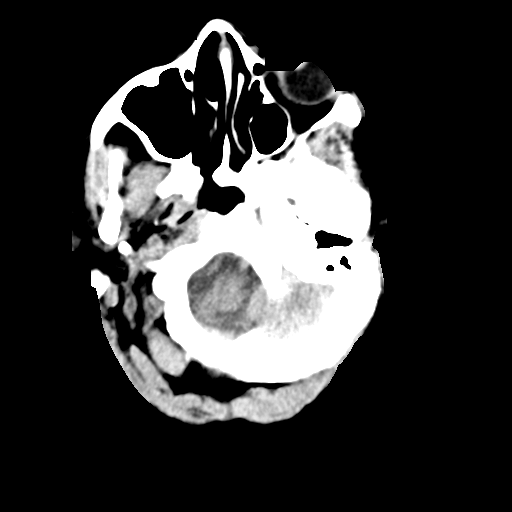
[im 4/32  bone]
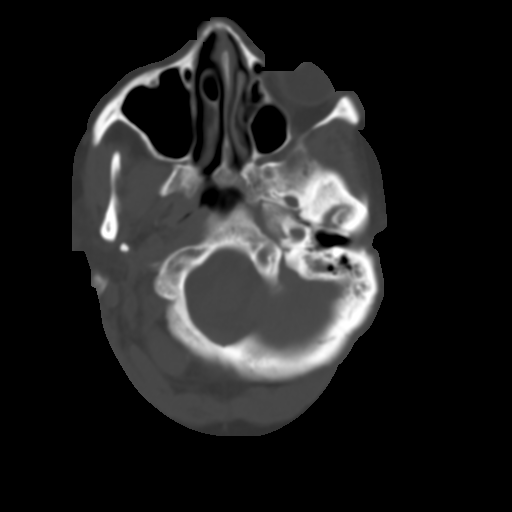
[im 8/32  brain]
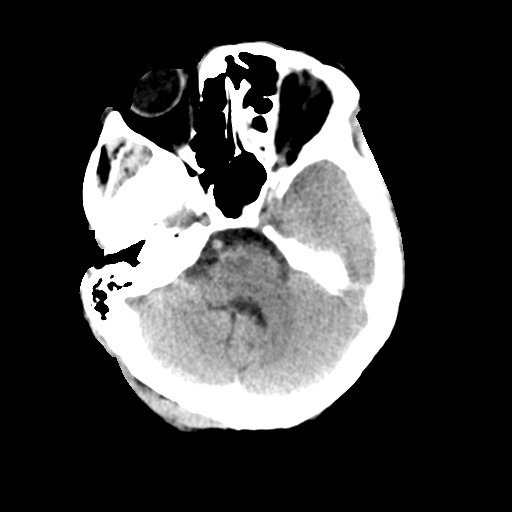
[im 12/32  brain]
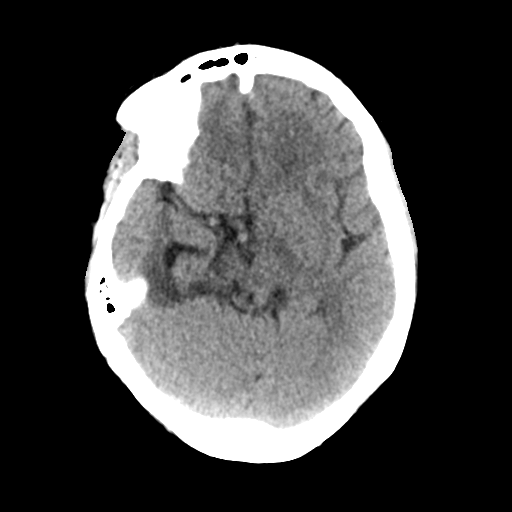
[im 16/32  brain]
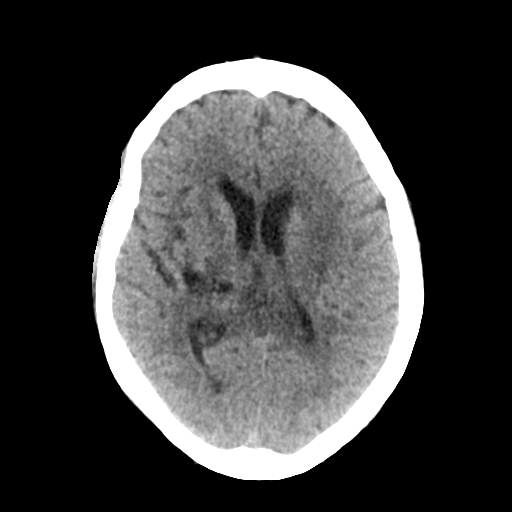
[im 20/32  brain]
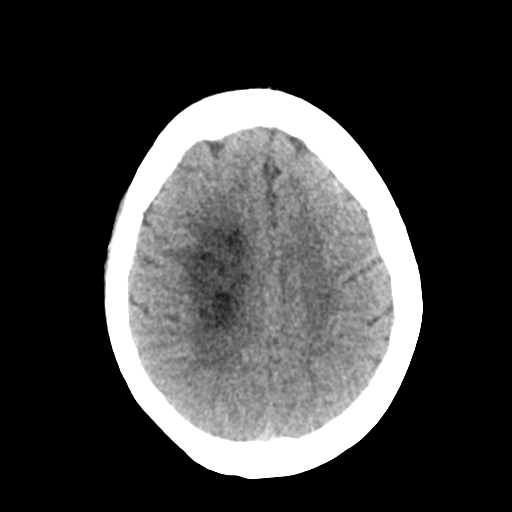
[im 20/32  bone]
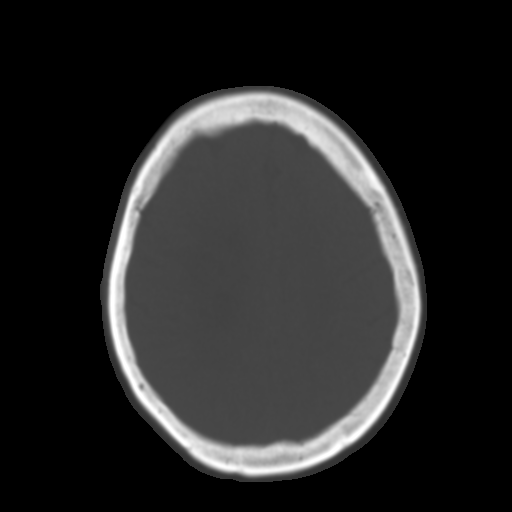
[im 24/32  brain]
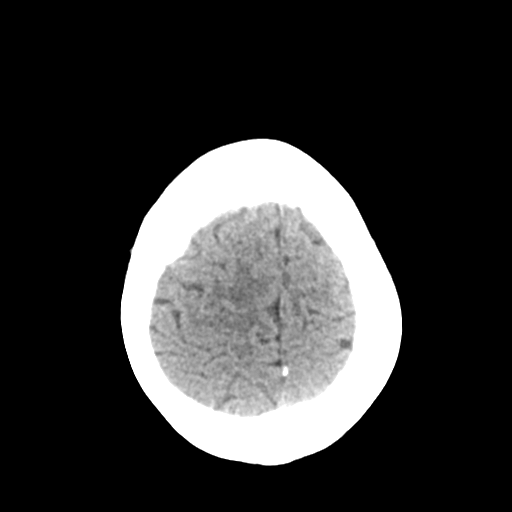
[im 28/32  brain]
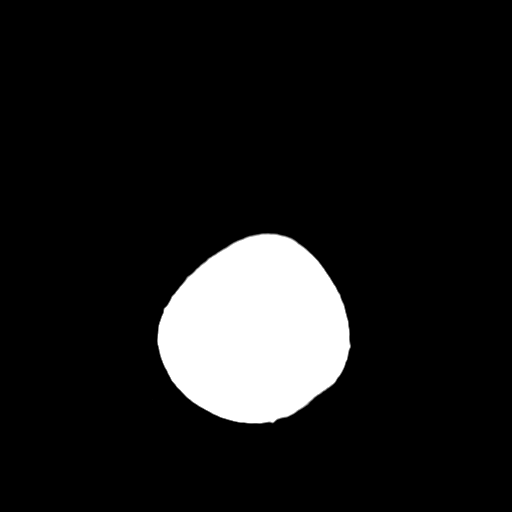

[Series 4: head bone · axial · 0.41mm/px · z∈[-46,+10]mm · 4 of 79 slices shown]
[im 8/79  bone]
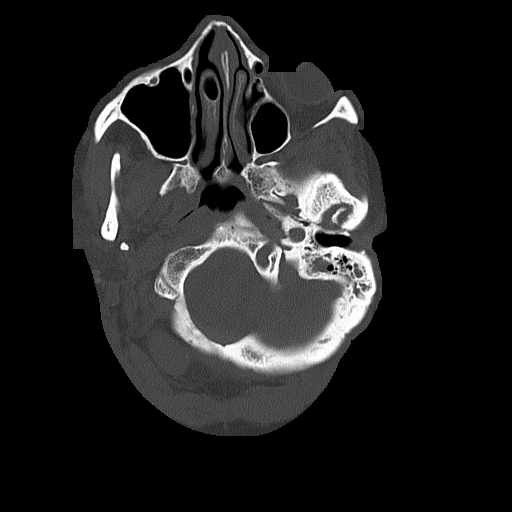
[im 16/79  bone]
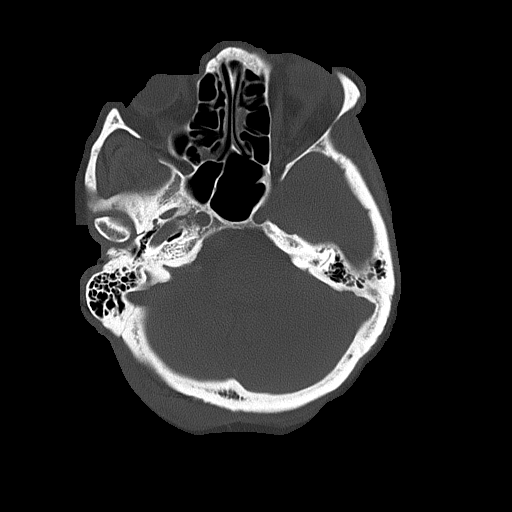
[im 24/79  bone]
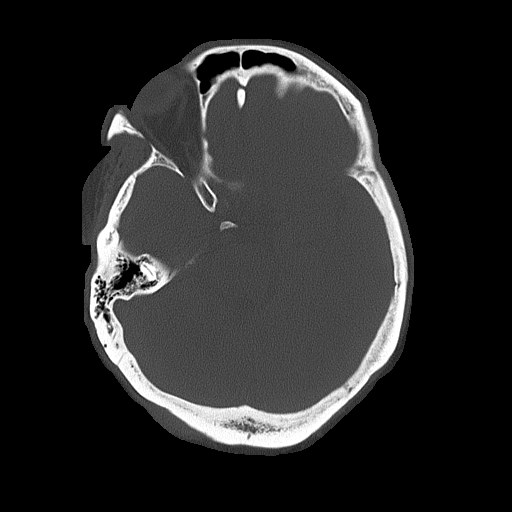
[im 36/79  bone]
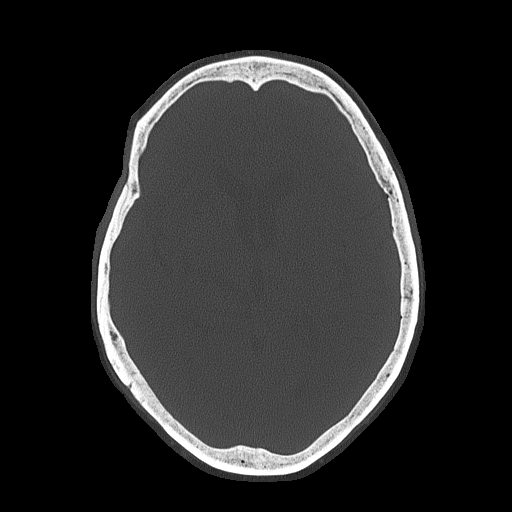

[Series 5: head without cor · coronal · non-contrast · 0.28mm/px · 3 of 67 slices shown]
[im 23/67  brain]
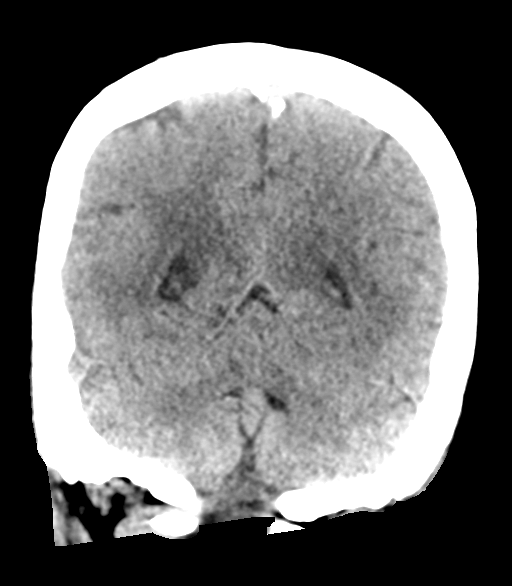
[im 30/67  brain]
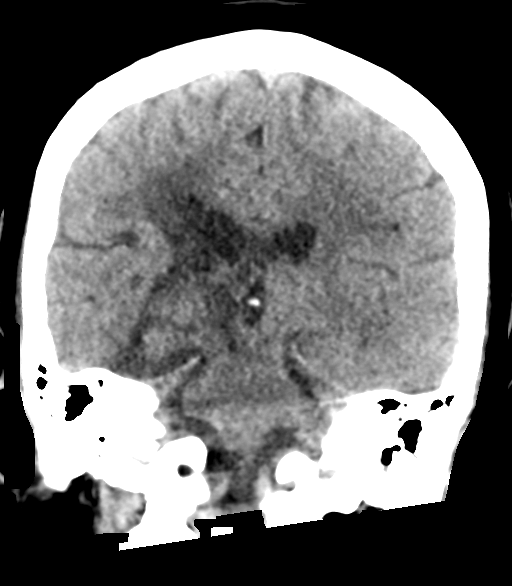
[im 37/67  brain]
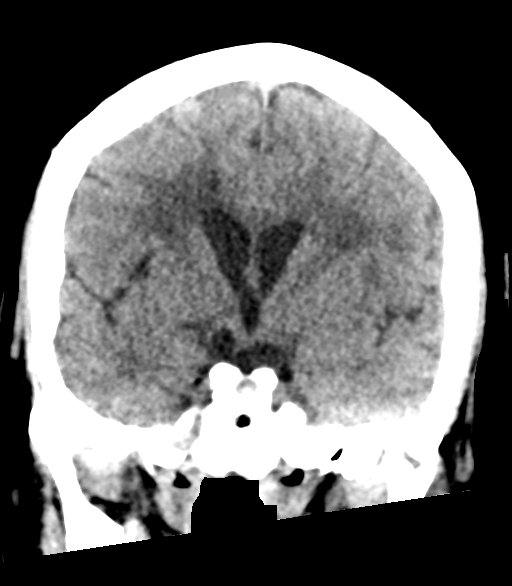

[Series 6: head without sag · sagittal · non-contrast · 0.33mm/px · 3 of 51 slices shown]
[im 20/51  brain]
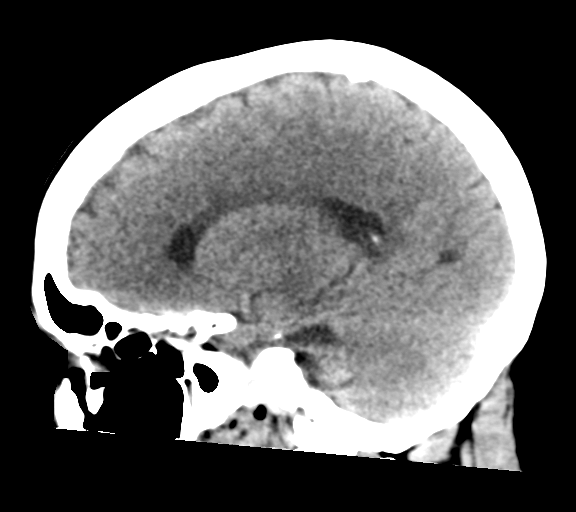
[im 26/51  brain]
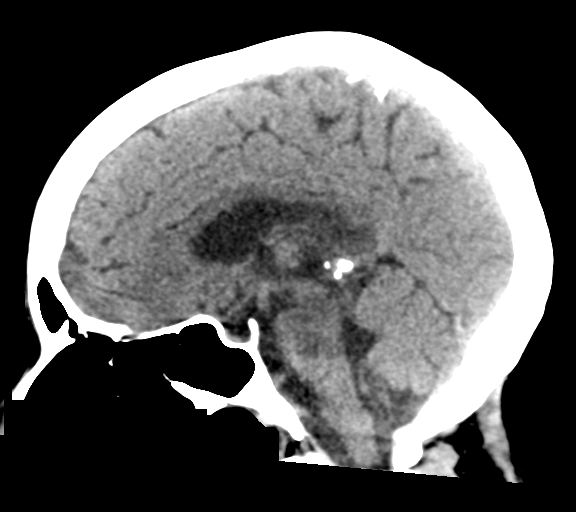
[im 31/51  brain]
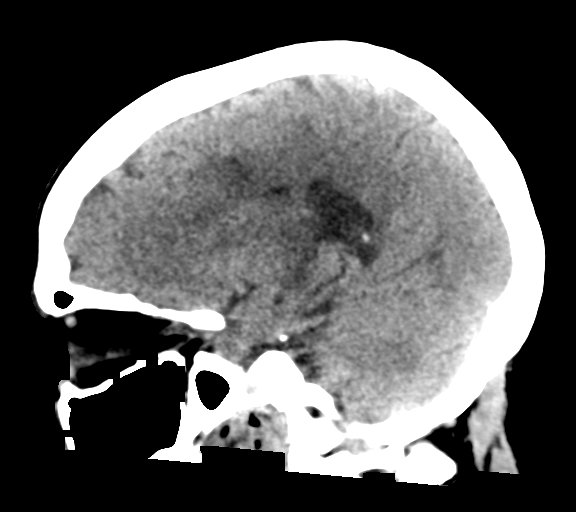

[17 of 47 positions shown; findings below may reference images not displayed]

FINDINGS: Brain: No acute intracranial hemorrhage or mass effect.
Encephalomalacia/gliosis involving the right corona radiata, basal
ganglia, and thalamus. Right inferior temporal encephalomalacia.
Additional chronic infarcts left corona radiata and thalamus. No
acute appearing loss of gray-white differentiation. No extra-axial
collection.

Vascular: No hyperdense vessel or unexpected calcification.

Skull: No acute abnormality.

Sinuses/Orbits: Minor mucosal thickening.  Orbits unremarkable.

Other: Inferior left mastoid opacification.
IMPRESSION: No acute intracranial abnormality. Multifocal
encephalomalacia/gliosis.

## 2021-04-08 MED ORDER — ENSURE ENLIVE PO LIQD
237.0000 mL | Freq: Three times a day (TID) | ORAL | Status: DC
  Administered 2021-04-08 – 2021-04-16 (×20): 237 mL

## 2021-04-08 MED ORDER — BENZOCAINE 10 % MT GEL
Freq: Four times a day (QID) | OROMUCOSAL | Status: DC | PRN
  Filled 2021-04-08: qty 9

## 2021-04-08 NOTE — Progress Notes (Addendum)
[facility] PROGRESS NOTE  Charlene [name] EUM:[id number] DOB: 1966-03-18 DOA: 08/26/2020 PCP: Patient, [name] (Inactive)    October 08, 2020     10/16/2020                        10/16/2020     10/18/2020                        10/20/2020      12/30/2020                   12/30/2020     01/04/2021                   02/24/2021 Viv really needs a teddy bear!   Status: Remains inpatient appropriate because:Altered mental status and Unsafe d/c plan: Requirement to discharge to trach capable SNF   Dispo: The patient is from: Home              Anticipated d/c is to: SNF trach capable-clinicals resubmitted to multiple facilities as of 8/31.  Unable to decannulate 2/2 and lack of appropriate voluntary and involuntary cough response              Patient currently is medically stable to d/c.   Difficult to place patient Yes         Barriers to DC: Continued requirement for right hand mitt to prevent self decannulation.  This is considered a restraint by SNFs. patient is not doing this by accident she is purposefully D cannulating herself.   Level of care: Telemetry Medical  Code Status: Full Family Communication: Extensive conversation held with husband [name] by phone on 9/21   DVT prophylaxis: Chronically bedbound so Lovenox discontinued   Vaccination status: Unvaccinated-husband has agreed to [employer] vaccine but only if required by accepting facility  Micro Data:  2/9 COVID + Influenza negative 2/10 MRSA PCR negative 2/12 BCx 2 > no growth  2/19 BC 1/2 +Stap EPI, 1/2 no growth  10/07/2020 sputum positive for Serratia marcescens 4/8 Enterobacter cloaca + urine culture MDR sensitive only to imipenem and gentamicin 5/20 urine positive for Klebsiella resistant to ampicillin and Macrodantin 6/2 urine positive for Klebsiella resistant to ampicillin, cefazolin and Macrodantin 7/4 urine positive for ESBL Klebsiella resistant to ampicillin, cefazolin and Macrodantin 7/28 urine positive for  Enterobacter resistant to cefazolin, cefepime and Septra 9/2 urine positive for pansensitive Pseudomonas     Antimicrobials:  Cefepime 2/12 > 2/15 Vanc 2/12 > 2/15 10/08/2020 ciprofloxacin>>3/30 Unasyn 3/30 >> 4/02 Rocephin 4/11 > 4/12 Meropenem 4/12>4/17 Meropenem 5/20 through 5/22 Ceftriaxone 5/22 through 5/26 Meropenem 6/3 Ceftriaxone 6/4 through 6/10 Septra 7/4 through 7/7 Ceftriaxone 7/29 was 1 dose followed by 1 dose of fosfomycin Cipro 9/2 >>   HPI: 55 yo F with 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  Subsequently on 2/10, she developed obtundation and respiratory arrest leading to intubation.  She has remained persistently obtunded, now has a trach and a PEG.  Hospital course complicated by persistent fever, Serratia pneumonia and now pre-orbital cellulitis  As of 3/25 with adjustments in hypertonicity medications patient began to improve regarding hypertonicity.  Eventually started on provigil with improvements in alertness.  Evaluated by rehab physician who recommended increasing provigil dose further.  Subsequently patient has become more alert.  Most days she is able to track and with encouragement able to follow simple  commands especially since partial obstruction glasses placed.  See progress note dictated 4/27 for expanded details.   Significant events: 2/09 Admit for ICH 2/10 EVD placed, later intubated due to obtundation and respiratory arrest 2/11 MRI Brain, hypertonic saline d/c'd, EVD not working.  Husband did not consent to PICC  2/13 EVD removed 2/17 [place] discussion with family, PMT 2/24 Bedside trach and PEG 3/1 Transferred to [place]  Subjective: Continues with flat affect and limited interaction.  She definitely was oriented to name and to place when given options to choose from.  Emotional support given.  Objective: Vitals:   04/08/21 0408 04/08/21 0500  BP: 127/84 (!)  91/51  Pulse: 86   Resp: 18   Temp: 98.3 F (36.8 C)   SpO2: 100%    Filed Weights   03/16/21 0500 03/17/21 0500 03/23/21 0346  Weight: 65.1 kg 61.5 kg 67.5 kg    Exam:       Gen: Awake and alert but with flat affect and limited engagement Respiratory: #4.0 cuffless trach, trach collar with FiO2 21% at 5L; lung sounds clear to auscultation, no tracheal secretions Cardiovascular: S1S2, regular pulse, no peripheral edema, skin warm and dry to the touch with adequate capillary refill Abdomen:  PEG /nocturnal tube feedings. LBM 9/20, nondistended, normoactive bowel sounds.   Neurological: CN 2 -12 intact. Spontaneous purposeful movement of right upper extremity/strength 4/5.   RLE strength 2/5 with purposeful movement.  Persistent left-side neglect.  Per SLP note today patient with decreased ability to eat and contain p.o.'s orally since 9/21 and remains n.p.o. but is allowed tube feedings. Psychiatric: Awake and alert although has flat depressed affect and is not engaging in she has previously including with therapies and appears to not want to eat.  Assessment/Plan: Acute problems: Multiple acute strokes  Right Thalamus ICH with IVH  Obstructive Hydrocephalus/left-sided neglect /Non TBI related hypertonicity -Continue HS baclofen -Continues with LUE neglect requiring soft splint.  Moves both LEs right much greater than left -Change in mental status recently which appears consistent with depression -MRI ordered but radiology staff states unable to complete.  Plan is to order noncontrasted CT of the head which patient would likely better tolerate given her requirement for tracheostomy attached to trach collar.  Subsequently CT head without contrast without any acute changes.  Only updated -Husband would like to participate with therapies.  He is available every Tuesday at 11 AM.  Message sent to therapy team  Tracheostomy dependent -PCCM/trach team following closely regarding candidacy for  decannulation. -Husband and family agree risk outweighs benefit to decannulate -Continues to require mitten to prevent accidental decannulation-teaching patient how to use soft touch call light to see if this will aid in ability to remove mitt  Microcytic anemia with known previous iron deficiency Continues with low MCV and hemoglobin 11-repeat anemia panel in September normal  Fever 2/2 Pseudomonas UTI/history of ESBL Klebsiella UTI Completed 7 days of Cipro Continue contact isolation  Constipation -Continue prn MiraLAX -7 days between last bowel movement as of 9/19 so was given MOM x 1 and subsequently had a bowel movement on 9/20.  Today 9/21 was on bedpan upon my entry to the room.  Hypertensive emergency/Malignant HTN -Continue Norvasc, beta-blocker and hydralazine.  Blood pressure well controlled  Dysphagia -Continue dysphagia 1 with thin liquids-Inconsistent oral intake therefore -continue nocturnal tube feedings-given patient bubbling orally and pushing food out speech therapy has evaluated her and notes that she does this occasionally and this does not appear to  be worsening of swallowing abilities but a sign of her underlying depression -As of 9/22 patient continues to have issues with managing food bolus in mouth.  SLP recommending n.p.o. for now  History of urinary retention -Continue Cardura  Depression -Discussed current symptoms with patient's husband noting that it is perfectly understandable that patient would be sad and depressed -Explained how SSRIs work and this would be my recommendation for a medication.  Both he and Viv historically do not like to take medications he will think about it and discuss with the family given her symptoms seem to be somewhat severe and be influencing her ability to participate with therapies and her desire to eat  Right upper extremity thrombophlebitis - [name] -> age indeterminate SVT of right cephalic vein-RUE ultrasound pending-as a  precaution attending physician on 9/20 opted to initiate pharmacologic VTE prophylaxis after clearing with NS Xu -Duplex 9/21 w/o SVT or DVT  Neck Contracture/torticollis -- Resolved  Recurrent right knee pain/known ligamentous injury and chronic osteoarthritis -s/p injection of Marcaine and Depomedrol 6/30 -9/19 given blackbox warnings regarding stroke and MI dc'd preadmission Celebrex  Chronic pansinusitis/recurrent fevers 2/2 aspiration PNA;History of Serratia Pneumonia  Hospital Acquired Pneumonia:  -Completed several courses of antibiotics -Follow-up imaging including sinus CT consistent with chronic but stable and resolving sinusitis  HLD -Continue Lipitor  Data Reviewed: CBG: Recent Labs  Lab 04/06/21 0815 04/06/21 1125 04/06/21 1627 04/06/21 1952 04/07/21 0247  GLUCAP 110* 106* 95 86 128*       Scheduled Meds:  acetaminophen  650 mg Per Tube Q6H   amLODipine  10 mg Per Tube Daily   atorvastatin  40 mg Per Tube Daily   baclofen  5 mg Per Tube QHS   carvedilol  25 mg Per Tube BID WC   chlorhexidine gluconate (MEDLINE KIT)  15 mL Mouth Rinse BID   Chlorhexidine Gluconate Cloth  6 each Topical Daily   diclofenac Sodium  4 g Topical QID   doxazosin  2 mg Per Tube Daily   enoxaparin (LOVENOX) injection  40 mg Subcutaneous Q24H   feeding supplement  237 mL Oral TID BM   feeding supplement (JEVITY 1.5 CAL/FIBER)  750 mL Per Tube Q24H   feeding supplement (PROSource TF)  45 mL Per Tube Daily   free water  200 mL Per Tube Q4H   hydrALAZINE  37.5 mg Per Tube Q8H   mouth rinse  15 mL Mouth Rinse 5 X Daily   modafinil  200 mg Per Tube Daily   multivitamin with minerals  1 tablet Per Tube Daily   pantoprazole sodium  40 mg Per Tube Daily   Continuous Infusions:  sodium chloride Stopped (10/26/20 1445)    Principal Problem:   ICH (intracerebral hemorrhage) ([place]) Active Problems:   Acute hypoxemic respiratory failure (HCC)   Tracheostomy dependent (HCC)    Palliative care by specialist   Muscle hypertonicity   Oropharyngeal dysphagia   Poor prognosis   Aspiration pneumonia of right lower lobe (HCC)   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  Palliative care.   PCCM continues to follow.  No decannulation.   Procedures: Tracheostomy PEG tube EEG Echocardiogram  Antibiotics: Vancomycin 2/12-2/15 Maxipime  2/12-2/15 Ancef 2/19-2/27 Vancomycin 2/20 x 1 dose Maxipime 2/27- 3/5 Vancomycin 3/1-3/3 Ancef 3/6-3/14 Cipro 3/20 4-3/30 Unasyn 3/30-4/4 Rocephin 4/11-4/12 Meropenem 4/12-4/17 Cipro 9/2 >> 9/8   Time spent: 15 minutes    Erin Hearing ANP  [facility] 7 am - 330 pm/M-F for direct patient care and secure chat Please refer to [facility] for contact info [phone]  Days

## 2021-04-08 NOTE — Plan of Care (Signed)
  Problem: Coping: Goal: Will identify appropriate support needs Outcome: Progressing   Problem: Health Behavior/Discharge Planning: Goal: Ability to manage health-related needs will improve Outcome: Progressing   Problem: Self-Care: Goal: Verbalization of feelings and concerns over difficulty with self-care will improve Outcome: Progressing Goal: Ability to communicate needs accurately will improve Outcome: Progressing   Problem: Nutrition: Goal: Risk of aspiration will decrease Outcome: Progressing   Problem: Intracerebral Hemorrhage Tissue Perfusion: Goal: Complications of Intracerebral Hemorrhage will be minimized Outcome: Progressing   Problem: Education: Goal: Knowledge of General Education information will improve Description: Including pain rating scale, medication(s)/side effects and non-pharmacologic comfort measures Outcome: Progressing   Problem: Health Behavior/Discharge Planning: Goal: Ability to manage health-related needs will improve Outcome: Progressing   Problem: Clinical Measurements: Goal: Ability to maintain clinical measurements within normal limits will improve Outcome: Progressing Goal: Will remain free from infection Outcome: Progressing Goal: Diagnostic test results will improve Outcome: Progressing Goal: Respiratory complications will improve Outcome: Progressing Goal: Cardiovascular complication will be avoided Outcome: Progressing   Problem: Coping: Goal: Level of anxiety will decrease Outcome: Progressing   Problem: Elimination: Goal: Will not experience complications related to bowel motility Outcome: Progressing Goal: Will not experience complications related to urinary retention Outcome: Progressing   Problem: Pain Managment: Goal: General experience of comfort will improve Outcome: Progressing   Problem: Skin Integrity: Goal: Risk for impaired skin integrity will decrease Outcome: Progressing   Problem: Intracerebral  Hemorrhage Tissue Perfusion: Goal: Complications of Intracerebral Hemorrhage will be minimized Outcome: Progressing   Problem: Ischemic Stroke/TIA Tissue Perfusion: Goal: Complications of ischemic stroke/TIA will be minimized Outcome: Progressing   

## 2021-04-08 NOTE — Progress Notes (Signed)
Speech Language Pathology Treatment: Dysphagia;Cognitive-Linquistic  Patient Details Name: Charlene [name] MRN: [id number] DOB: 1965-10-01 Today's Date: 04/08/2021 Time: 1324-4010 SLP Time Calculation (min) (ACUTE ONLY): 60 min  Assessment / Plan / Recommendation Clinical Impression  Pt was seen for swallowing and cognitive/communication therapy.  She was repositioned to optimize participation; glasses and PMV placed. Oral care provided with Charlene [name] doing 50% of the work to brush her teeth and self-suction.  No spontaneous speech noted today.  She accepted several sips of milk, demonstrating apraxic errors and difficulty initiating motor movements, not inconsistent with behaviors noted during prior sessions.  Needed max assist initially to achieve momentum with sipping from the straw and swallowing.  Spontaneous phonation was wet.  Oral suctioning again provided.  Purees were held orally, requiring oral suction to remove.  Pt offered marker to respond to written + verbal questions - when asked "Are you hurting today?" She wrote "no."  Further written questions did not evoke written or oral answers, despite max visual/verbal cues and encouragement. Pt with poor initiation today - she began to fall asleep so session was ended; PMV removed.   RN noted decreased ability to eat and contain POs orally since at least yesterday.  MRI is pending per discussion with staff.  SLP will continue to follow for swallowing and communication. Recommend continuing efforts to feed Charlene [name] -hold tray when she is unable/uninterested.     HPI HPI: Pt is 55 y/o female presents to [facility] on 2/9 with L sided weakness and headaches. PMH includes anxiety, HTN. 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 S/p Right frontal ventricular catheter placement on 2/10, stopped functioning on 2/11.   MRI 2/11 shows right greater than left mesial temporal lobes / parahippocampal cortex, bilateral basal ganglia, and possibly the upper pons that are concerning for acute infarcts, most likely secondary to mass effect from the hemorrhage. ETT 2/10; trach/PEG 09/10/20.  Trach changed to #6 cuffless 3/11; #4 cuffless 5/4. Has had two MBS studies, the last on 7/8 - pt started on dysphagia 1, thin liquids. Trach replaced following self-decannulation 7/21, again on 8/23.      SLP Plan  Continue with current plan of care      Recommendations for follow up therapy are one component of a multi-disciplinary discharge planning process, led by the attending physician.  Recommendations may be updated based on patient status, additional functional criteria and insurance authorization.    Recommendations  Diet recommendations: Dysphagia 1 (puree);Thin liquid Liquids provided via: Cup;Straw Medication Administration: Via alternative means Supervision: Staff to assist with self feeding;Trained caregiver to feed patient;Full supervision/cueing for compensatory strategies Compensations: Minimize environmental distractions;Small sips/bites      Patient may use Passy-Muir Speech Valve: During all waking hours (remove during sleep);During PO intake/meals PMSV Supervision: Intermittent         Oral Care Recommendations: Oral care BID Follow up Recommendations: Skilled Nursing facility SLP Visit Diagnosis: Dysphagia, oropharyngeal phase (R13.12);Cognitive communication deficit (R41.841) Plan: Continue with current plan of care                     Charlene [name] L. Charlene [name], MA CCC/SLP Acute Rehabilitation Services Office number [phone] Pager [phone]  Charlene [name] Charlene [name] 04/08/2021, 10:22 AM

## 2021-04-09 LAB — GLUCOSE, CAPILLARY
Glucose-Capillary: 151 mg/dL — ABNORMAL HIGH (ref 70–99)
Glucose-Capillary: 113 mg/dL — ABNORMAL HIGH (ref 70–99)

## 2021-04-09 NOTE — Progress Notes (Signed)
Resume care from previous RN,  no mittens application noted on patient. Patient appears calm and relaxed.Family at bedside.   RN POC: Frequent purposeful rounds       Provide supportive care and ROM.

## 2021-04-09 NOTE — Progress Notes (Addendum)
[facility] PROGRESS NOTE  Charlene [name] EUM:[id number] DOB: 1966-03-18 DOA: 08/26/2020 PCP: Patient, [name] (Inactive)    October 08, 2020     10/16/2020                        10/16/2020     10/18/2020                        10/20/2020      12/30/2020                   12/30/2020     01/04/2021                   02/24/2021 Viv really needs a teddy bear!   Status: Remains inpatient appropriate because:Altered mental status and Unsafe d/c plan: Requirement to discharge to trach capable SNF   Dispo: The patient is from: Home              Anticipated d/c is to: SNF trach capable-clinicals resubmitted to multiple facilities as of 8/31.  Unable to decannulate 2/2 and lack of appropriate voluntary and involuntary cough response              Patient currently is medically stable to d/c.   Difficult to place patient Yes         Barriers to DC: Continued requirement for right hand mitt to prevent self decannulation.  This is considered a restraint by SNFs. patient is not doing this by accident she is purposefully D cannulating herself.   Level of care: Telemetry Medical  Code Status: Full Family Communication: Extensive conversation held with husband [name] by phone on 9/21   DVT prophylaxis: Chronically bedbound so Lovenox discontinued   Vaccination status: Unvaccinated-husband has agreed to [employer] vaccine but only if required by accepting facility  Micro Data:  2/9 COVID + Influenza negative 2/10 MRSA PCR negative 2/12 BCx 2 > no growth  2/19 BC 1/2 +Stap EPI, 1/2 no growth  10/07/2020 sputum positive for Serratia marcescens 4/8 Enterobacter cloaca + urine culture MDR sensitive only to imipenem and gentamicin 5/20 urine positive for Klebsiella resistant to ampicillin and Macrodantin 6/2 urine positive for Klebsiella resistant to ampicillin, cefazolin and Macrodantin 7/4 urine positive for ESBL Klebsiella resistant to ampicillin, cefazolin and Macrodantin 7/28 urine positive for  Enterobacter resistant to cefazolin, cefepime and Septra 9/2 urine positive for pansensitive Pseudomonas     Antimicrobials:  Cefepime 2/12 > 2/15 Vanc 2/12 > 2/15 10/08/2020 ciprofloxacin>>3/30 Unasyn 3/30 >> 4/02 Rocephin 4/11 > 4/12 Meropenem 4/12>4/17 Meropenem 5/20 through 5/22 Ceftriaxone 5/22 through 5/26 Meropenem 6/3 Ceftriaxone 6/4 through 6/10 Septra 7/4 through 7/7 Ceftriaxone 7/29 was 1 dose followed by 1 dose of fosfomycin Cipro 9/2 >>   HPI: 55 yo F with 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  Subsequently on 2/10, she developed obtundation and respiratory arrest leading to intubation.  She has remained persistently obtunded, now has a trach and a PEG.  Hospital course complicated by persistent fever, Serratia pneumonia and now pre-orbital cellulitis  As of 3/25 with adjustments in hypertonicity medications patient began to improve regarding hypertonicity.  Eventually started on provigil with improvements in alertness.  Evaluated by rehab physician who recommended increasing provigil dose further.  Subsequently patient has become more alert.  Most days she is able to track and with encouragement able to follow simple  commands especially since partial obstruction glasses placed.  See progress note dictated 4/27 for expanded details.   Significant events: 2/09 Admit for ICH 2/10 EVD placed, later intubated due to obtundation and respiratory arrest 2/11 MRI Brain, hypertonic saline d/c'd, EVD not working.  Husband did not consent to PICC  2/13 EVD removed 2/17 [place] discussion with family, PMT 2/24 Bedside trach and PEG 3/1 Transferred to [facility]  Subjective: Alert and awake.  Wrote to her mother that she had to pee.  Was able to hold her bladder until staff able to place bedpan under her.  Objective: Vitals:   04/09/21 0005 04/09/21 0435  BP: (!) 104/58 128/74  Pulse: 75 90  Resp:  17 18  Temp: 98.1 F (36.7 C) 98.2 F (36.8 C)  SpO2: 100% 100%   Filed Weights   03/17/21 0500 03/23/21 0346 04/09/21 0500  Weight: 61.5 kg 67.5 kg 67.4 kg    Exam:       Gen: Awake and alert  Respiratory: #4.0 cuffless trach, trach collar with FiO2 21% at 5L; lateral anterior lung sounds remain clear to auscultation and no increased work of breathing. Cardiovascular: S1S2, regular pulse, no peripheral edema, skin warm and dry to the touch with adequate capillary refill Abdomen:  PEG /nocturnal tube feedings. LBM 9/20, nondistended, normoactive bowel sounds.   Neurological: CN 2 -12 intact. Spontaneous purposeful movement of right upper extremity/strength 4/5.   RLE strength 2/5 with purposeful movement.  Persistent left-side neglect.  Per SLP note today patient with decreased ability to eat and contain p.o.'s orally since 9/21 and remains n.p.o. but is allowed tube feedings. Psychiatric: Awake and alert -less flat and more interactive today  Assessment/Plan: Acute problems: Multiple acute strokes  Right Thalamus ICH with IVH  Obstructive Hydrocephalus/left-sided neglect /Non TBI related hypertonicity -Continue HS baclofen -Continues with LUE neglect requiring soft splint.  Moves both LEs right much greater than left -Recent AMS-CT head w/o contrast: no acute changes.   -Husband would like to participate with therapies.  He is available every Tuesday at 11 AM.  Message sent to therapy team  Tracheostomy dependent -PCCM/trach team following closely regarding candidacy for decannulation. -Husband and family agree risk outweighs benefit to decannulate -Continues to require mitten to prevent accidental decannulation-teaching patient how to use soft touch call light to see if this will aid in ability to remove mitt -9/23 plan to remove mitt to see how patient tolerates-hopefully she will not pull any more trach since she has been repeatedly admonished over the past 2 weeks not do  so.  Microcytic anemia with known previous iron deficiency Continues with low MCV and hemoglobin 11-repeat anemia panel in September normal  Fever 2/2 Pseudomonas UTI/history of ESBL Klebsiella UTI Completed 7 days of Cipro Continue contact isolation  Constipation -Continue prn MiraLAX -7 days between last bowel movement as of 9/19 so was given MOM x 1 and subsequently had a bowel movement on 9/20.  Today 9/21 was on bedpan upon my entry to the room.  Hypertensive emergency/Malignant HTN -Continue Norvasc, beta-blocker and hydralazine.  Blood pressure well controlled  Dysphagia -Continue dysphagia 1 with thin liquids-Inconsistent oral intake therefore -continue nocturnal tube feedings  History of urinary retention -Continue Cardura  Depression -Discussed current symptoms with patient's husband noting that it is perfectly understandable that patient would be sad and depressed -Explained how SSRIs work and this would be my recommendation for a medication.  Both he and Viv historically do not like to take medications he  will think about it and discuss with the family given her symptoms seem to be somewhat severe and be influencing her ability to participate with therapies and her desire to eat  Right upper extremity thrombophlebitis - [name] -> age indeterminate SVT of right cephalic vein-RUE ultrasound pending-as a precaution attending physician on 9/20 opted to initiate pharmacologic VTE prophylaxis after clearing with NS Xu -Duplex 9/21 w/o SVT or DVT  Neck Contracture/torticollis -- Resolved  Recurrent right knee pain/known ligamentous injury and chronic osteoarthritis -s/p injection of Marcaine and Depomedrol 6/30 -9/19 given blackbox warnings regarding stroke and MI dc'd preadmission Celebrex  Chronic pansinusitis/recurrent fevers 2/2 aspiration PNA;History of Serratia Pneumonia  Hospital Acquired Pneumonia:  -Completed several courses of antibiotics -Follow-up imaging including  sinus CT consistent with chronic but stable and resolving sinusitis  HLD -Continue Lipitor  Data Reviewed: CBG: Recent Labs  Lab 04/06/21 1627 04/06/21 1952 04/07/21 0247 04/08/21 0237 04/08/21 0617  GLUCAP 95 86 128* 133* 166*       Scheduled Meds:  acetaminophen  650 mg Per Tube Q6H   amLODipine  10 mg Per Tube Daily   atorvastatin  40 mg Per Tube Daily   baclofen  5 mg Per Tube QHS   carvedilol  25 mg Per Tube BID WC   chlorhexidine gluconate (MEDLINE KIT)  15 mL Mouth Rinse BID   Chlorhexidine Gluconate Cloth  6 each Topical Daily   diclofenac Sodium  4 g Topical QID   doxazosin  2 mg Per Tube Daily   enoxaparin (LOVENOX) injection  40 mg Subcutaneous Q24H   feeding supplement  237 mL Per Tube TID BM   feeding supplement (JEVITY 1.5 CAL/FIBER)  750 mL Per Tube Q24H   feeding supplement (PROSource TF)  45 mL Per Tube Daily   free water  200 mL Per Tube Q4H   hydrALAZINE  37.5 mg Per Tube Q8H   mouth rinse  15 mL Mouth Rinse 5 X Daily   modafinil  200 mg Per Tube Daily   multivitamin with minerals  1 tablet Per Tube Daily   pantoprazole sodium  40 mg Per Tube Daily   Continuous Infusions:  sodium chloride Stopped (10/26/20 1445)    Principal Problem:   ICH (intracerebral hemorrhage) ([place]) Active Problems:   Acute hypoxemic respiratory failure (HCC)   Tracheostomy dependent (HCC)   Palliative care by specialist   Muscle hypertonicity   Oropharyngeal dysphagia   Poor prognosis   Aspiration pneumonia of right lower lobe (HCC)   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  Palliative care.   PCCM continues to  follow.  No decannulation.   Procedures: Tracheostomy PEG tube EEG Echocardiogram  Antibiotics: Vancomycin 2/12-2/15 Maxipime 2/12-2/15 Ancef 2/19-2/27 Vancomycin 2/20 x 1 dose Maxipime 2/27- 3/5 Vancomycin 3/1-3/3 Ancef 3/6-3/14 Cipro 3/20 4-3/30 Unasyn 3/30-4/4 Rocephin 4/11-4/12 Meropenem 4/12-4/17 Cipro 9/2 >> 9/8   Time spent: 15 minutes    Erin Hearing ANP  [facility] 7 am - 330 pm/M-F for direct patient care and secure chat Please refer to [place] for contact info [phone]  Days

## 2021-04-09 NOTE — Progress Notes (Signed)
Physical Therapy Treatment Patient Details Name: Charlene [name] MRN: [id number] DOB: 1965-08-21 Today's Date: 04/09/2021   History of Present Illness Pt is 55 y/o female presents to [facility] on 08/26/20 with L sided weakness and headaches. 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 s/p Right frontal ventricular catheter placement on 2/10, stopped functioning on 2/11.  MRI 2/11 shows right greater than left mesial temporal lobes / parahippocampal cortex, bilateral basal ganglia, and possibly the upper pons that are concerning for acute infarcts, most likely secondary to mass effect from the hemorrhage. ETT 2/10-2/24.  Pt with trach and PEG on 09/10/20. Hospital course complicated by persistent fever, Serratia pneumonia and now pre-orbital cellulitis.   Pt self decannulated 02/04/21, trach re-inserted by RT. Pt dislodged PEG, guided replacement 8/2.  PMH includes anxiety, HTN.    PT Comments    Goals updated.  Pt did not follow commands for me today, but participated best she could in PROM, edge of bed mobility.  Plans to lift her OOB to [facility] and work from [facility] with a table were thwarted by needing a battery for the lift machine.  We will continue efforts.  Request made for at least one therapist (PT, OT, SLP) to try to work with Charlene [name] each week while her husband Charlene [name] is present (recommendation is for Tuesdays at around 11 am).   Recommendations for follow up therapy are one component of a multi-disciplinary discharge planning process, led by the attending physician.  Recommendations may be updated based on patient status, additional functional criteria and insurance authorization.  Follow Up Recommendations  SNF     Equipment Recommendations  Wheelchair (measurements PT);Wheelchair cushion (measurements PT);Hospital bed;Other (comment) (hoyer lift, 18x18 tilt in space)     Recommendations for Other Services       Precautions / Restrictions Precautions Precautions: Fall Precaution Comments: PEG, trach, no mitt today Other Brace: bil. PRAFOs, soft R knee extension splint and soft L elbow extension splint     Mobility  Bed Mobility Overal bed mobility: Needs Assistance Bed Mobility: Rolling Rolling: Max assist Sidelying to sit: Max assist     Sit to sidelying: Max assist General bed mobility comments: Max assist for all bed mobility and transfers, pt attempting at one point to pull herself up to stting with bed rail in the bed.    Transfers                 General transfer comment: unfortunately, my idea to transfer OOB to [facility] and work from [facility] with a table on writing or painting, but the lift does not currently have a working Science writer.  Ambulation/Gait                 Stairs             Wheelchair Mobility    Modified Rankin (Stroke Patients Only)       Balance Overall balance assessment: Needs assistance Sitting-balance support: Feet unsupported;Single extremity supported Sitting balance-Leahy Scale: Poor Sitting balance - Comments: Poor to zero sitting balance, at times pushing to the left with an extended right arm.  Worked on partial sidelying to sit transitions while upright WB through both left and right elbows. Postural control: Posterior lean;Left lateral lean  Cognition Arousal/Alertness: Awake/alert Behavior During Therapy: Flat affect Overall Cognitive Status: Impaired/Different from baseline Area of Impairment: Attention;Following commands;Problem solving                   Current Attention Level: Sustained;Focused Memory: Decreased short-term memory Following Commands: Follows one step commands inconsistently;Follows one step commands with increased time Safety/Judgement: Decreased awareness of safety;Decreased awareness of deficits Awareness:  Intellectual Problem Solving: Slow processing;Decreased initiation;Difficulty sequencing;Requires verbal cues;Requires tactile cues General Comments: Pt not doing much for me to command today, she did have a spontaneous reaching for the wash cloth to wipe her mouth (or attempt to wipe her mouth), but when asked to do it she would not.  I asked several questions throughout the session to try to get her to nodd, but unable to elicit a nod today.      Exercises General Exercises - Upper Extremity Shoulder Flexion: PROM;Left;10 reps Shoulder ABduction: PROM;Left;10 reps Elbow Extension: Left;10 reps;PROM Wrist Extension: PROM;Left;10 reps General Exercises - Lower Extremity Ankle Circles/Pumps: PROM;Both;10 reps Heel Slides: PROM;Both;10 reps Other Exercises Other Exercises: trunk rotation with knees flexed to the right x10 reps PROM Other Exercises: cervical rotation to the left 10 reps PROM    General Comments        Pertinent Vitals/Pain Pain Assessment: Faces Faces Pain Scale: Hurts little more Pain Location: bil legs and L elbow with PROM Pain Descriptors / Indicators: Grimacing;Guarding (furrowing of her brow) Pain Intervention(s): Limited activity within patient's tolerance;Monitored during session;Repositioned    Home Living                      Prior Function            PT Goals (current goals can now be found in the care plan section) Acute Rehab PT Goals Patient Stated Goal: unable to state PT Goal Formulation: Patient unable to participate in goal setting Time For Goal Achievement: 04/23/21 Potential to Achieve Goals: Fair Progress towards PT goals: Progressing toward goals    Frequency    Min 1X/week      PT Plan Current plan remains appropriate    Co-evaluation              AM-PAC PT "6 Clicks" Mobility   Outcome Measure  Help needed turning from your back to your side while in a flat bed without using bedrails?: Total Help needed  moving from lying on your back to sitting on the side of a flat bed without using bedrails?: Total Help needed moving to and from a bed to a chair (including a wheelchair)?: Total Help needed standing up from a chair using your arms (e.g., wheelchair or bedside chair)?: Total Help needed to walk in hospital room?: Total Help needed climbing 3-5 steps with a railing? : Total 6 Click Score: 6    End of Session   Activity Tolerance: Patient tolerated treatment well Patient left: in bed;with call bell/phone within reach Nurse Communication: Mobility status PT Visit Diagnosis: Hemiplegia and hemiparesis;Other symptoms and signs involving the nervous system (R29.898);Other abnormalities of gait and mobility (R26.89) Hemiplegia - Right/Left: Left Hemiplegia - dominant/non-dominant: Non-dominant Hemiplegia - caused by: Cerebral infarction;Nontraumatic intracerebral hemorrhage     Time: 4098-1191 PT Time Calculation (min) (ACUTE ONLY): 28 min  Charges:  $Therapeutic Exercise: 8-22 mins $Therapeutic Activity: 8-22 mins                     Corinna Capra, PT, DPT  Acute Rehabilitation  [place] [facility]ikes Supervisor [phone] pager [phone]) [phone] office

## 2021-04-10 LAB — CBC
HCT: 32.5 % — ABNORMAL LOW (ref 36.0–46.0)
Hemoglobin: 10.1 g/dL — ABNORMAL LOW (ref 12.0–15.0)
MCH: 24.7 pg — ABNORMAL LOW (ref 26.0–34.0)
MCHC: 31.1 g/dL (ref 30.0–36.0)
MCV: 79.5 fL — ABNORMAL LOW (ref 80.0–100.0)
Platelets: 252 10*3/uL (ref 150–400)
RBC: 4.09 MIL/uL (ref 3.87–5.11)
RDW: 14.7 % (ref 11.5–15.5)
WBC: 3.9 10*3/uL — ABNORMAL LOW (ref 4.0–10.5)
nRBC: 0 % (ref 0.0–0.2)

## 2021-04-10 LAB — BASIC METABOLIC PANEL
Anion gap: 6 (ref 5–15)
BUN: 16 mg/dL (ref 6–20)
CO2: 31 mmol/L (ref 22–32)
Calcium: 9.4 mg/dL (ref 8.9–10.3)
Chloride: 102 mmol/L (ref 98–111)
Creatinine, Ser: 0.59 mg/dL (ref 0.44–1.00)
GFR, Estimated: >60 mL/min (ref >60)
Glucose, Bld: 110 mg/dL — ABNORMAL HIGH (ref 70–99)
Potassium: 3.8 mmol/L (ref 3.5–5.1)
Sodium: 139 mmol/L (ref 135–145)

## 2021-04-10 LAB — MAGNESIUM: Magnesium: 1.9 mg/dL (ref 1.7–2.4)

## 2021-04-10 NOTE — Plan of Care (Signed)
  Problem: Coping: Goal: Will identify appropriate support needs Outcome: Progressing   Problem: Intracerebral Hemorrhage Tissue Perfusion: Goal: Complications of Intracerebral Hemorrhage will be minimized Outcome: Progressing   Problem: Clinical Measurements: Goal: Ability to maintain clinical measurements within normal limits will improve Outcome: Progressing Goal: Will remain free from infection Outcome: Progressing Goal: Diagnostic test results will improve Outcome: Progressing Goal: Respiratory complications will improve Outcome: Progressing Goal: Cardiovascular complication will be avoided Outcome: Progressing

## 2021-04-10 NOTE — Progress Notes (Signed)
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  Subsequently on 2/10, she developed obtundation and respiratory arrest leading to intubation.  She was persistently obtunded , with subsequent trach and PEG.  Hospital course complicated by persistent fever, Serratia pneumonia, preorbital cellulitis.  She was a started on Provigil with improvement in alertness.  Most day patient is able to track with encouragement, able to follow simple commands especially since partial obstruction glasses placed.  Patient care was transferred to [facility] on 3/1.  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  -Neurology Sign off 2/25  2-Tracheostomy Dependent:  _PCCM/trach team follow for trach care. -Patient  is  considered high risk for decannulation.  Plan is to continue with trach  3-microcytic anemia previous iron deficiency Monitor  hemoglobin  Completed course of antibiotics for Pseudomonas UTI ESBL UTI.  Constipation: Had a bowel movement 9/21st  Hypertensive emergency malignant hypertension: Continue Norvasc beta-blocker and hydralazine Leukopenia; monitor.  Dysphagia 1 diet, continue with tube feedings History of urinary retention: Continue with Cardura Right upper extremity thrombophlebitis: Currently on DVT prophylaxis Recurrent right knee/no ligamentous injuries and chronic osteoarthritis: Hyperlipidemia on Lipitor   Nutrition Problem: Inadequate oral intake Etiology: inability to eat    Signs/Symptoms: NPO status    Interventions: Ensure Enlive (each supplement provides 350kcal and 20 grams of protein), MVI, Tube feeding, Calorie Count, Prostat  Estimated body mass index is 24.73 kg/m as calculated from the following:   Height as of this encounter: _0  (1.651 m).   Weight as of this encounter: 67.4 kg.   DVT prophylaxis: Lovenox Code Status: Full code Family Communication: Husband who was at bedside Disposition Plan:  Status is: Inpatient  Remains inpatient appropriate because:IV treatments appropriate due to intensity of illness or inability to take PO  Dispo: The patient is from: Home              Anticipated d/c is to: SNF  that could accept  patient with trach              Patient currently is not medically stable to d/c.   Difficult to place patient Yes        Consultants:  Neurology CCM  Procedures:    Antimicrobials:    Subjective: She is alert, husband is feeding her. She point to milk, and husband give her some sips.    Objective:  Vitals:   04/10/21 0320 04/10/21 0500 04/10/21 0615 04/10/21 0851  BP:   (!) 137/97   Pulse:   83   Resp:   18 17  Temp:   98.9 F (37.2 C) 98.5 F (36.9 C)  TempSrc:   Oral Oral  SpO2: 100%  99%    Weight:  67.4 kg    Height:        Intake/Output Summary (Last 24 hours) at 04/10/2021 1000 Last data filed at 04/09/2021 1400 Gross per 24 hour  Intake 260 ml  Output --  Net 260 ml   Filed Weights   03/23/21 0346 04/09/21 0500 04/10/21 0500  Weight: 67.5 kg 67.4 kg 67.4 kg    Examination:  General exam: Appears calm and comfortable , trach in place Respiratory system: Clear to auscultation Cardiovascular system: S1 & S2 heard, RRR.  Gastrointestinal system: Abdomen is nondistended, soft and nontender. No organomegaly or masses felt. Normal bowel sounds heard. Peg in place Central nervous system: Alert moves right arm, right leg. Left arm contracted Extremities: no edema  Data Reviewed: I have personally reviewed following labs and imaging studies  CBC: Recent Labs  Lab 04/05/21 0757 04/10/21 0848  WBC 3.6* 3.9*  HGB 10.6* 10.1*  HCT 32.7* 32.5*  MCV 77.7* 79.5*  PLT 250 956   Basic Metabolic Panel: Recent Labs  Lab 04/05/21 0757  NA 140  K 3.8  CL 102  CO2 28  GLUCOSE 129*  BUN 19  CREATININE 0.56  CALCIUM 9.4  MG 1.9  PHOS 3.9   GFR: Estimated Creatinine Clearance: 72.3 mL/min (by C-G formula based on SCr of 0.56 mg/dL). Liver Function Tests: Recent Labs  Lab 04/05/21 0757  AST 28  ALT 58*  ALKPHOS 83  BILITOT 0.4  PROT 6.7  ALBUMIN 3.5   No results for input(s): LIPASE, AMYLASE in the last 168 hours. No results for input(s): AMMONIA in the last 168 hours. Coagulation Profile: No results for input(s): INR, PROTIME in the last 168 hours. Cardiac Enzymes: No results for input(s): CKTOTAL, CKMB, CKMBINDEX, TROPONINI in the last 168 hours. BNP (last 3 results) No results for input(s): PROBNP in the last 8760 hours. HbA1C: No results for input(s): HGBA1C in the last 72 hours. CBG: Recent Labs  Lab 04/07/21 0247 04/08/21 0237 04/08/21 0617 04/09/21 0616 04/09/21 1553  GLUCAP 128* 133* 166* 151* 113*   Lipid Profile: No results for  input(s): CHOL, HDL, LDLCALC, TRIG, CHOLHDL, LDLDIRECT in the last 72 hours. Thyroid Function Tests: No results for input(s): TSH, T4TOTAL, FREET4, T3FREE, THYROIDAB in the last 72 hours. Anemia Panel: No results for input(s): VITAMINB12, FOLATE, FERRITIN, TIBC, IRON, RETICCTPCT in the last 72 hours. Sepsis Labs: No results for input(s): PROCALCITON, LATICACIDVEN in the last 168 hours.  No results found for this or any previous visit (from the past 240 hour(s)).       Radiology Studies: CT HEAD WO CONTRAST (5MM)  Result Date: 04/08/2021 CLINICAL DATA:  Mental status change, persistent or worsening past strokes during this admission EXAM: CT HEAD WITHOUT CONTRAST TECHNIQUE: Contiguous axial images were obtained from the base of the skull through the vertex without intravenous contrast. COMPARISON:  09/23/2020 FINDINGS: Brain: No acute intracranial hemorrhage or mass effect. Encephalomalacia/gliosis involving the right corona radiata, basal ganglia, and thalamus. Right inferior temporal encephalomalacia. Additional chronic infarcts left corona radiata and thalamus. No acute appearing loss of gray-white differentiation. No extra-axial collection. Vascular: No hyperdense vessel or unexpected calcification. Skull: No acute abnormality. Sinuses/Orbits:  Minor mucosal thickening.  Orbits unremarkable. Other: Inferior left mastoid opacification. IMPRESSION: No acute intracranial abnormality. Multifocal encephalomalacia/gliosis. Electronically Signed   By: Macy Mis M.D.   On: 04/08/2021 13:20        Scheduled Meds:  acetaminophen  650 mg Per Tube Q6H   amLODipine  10 mg Per Tube Daily   atorvastatin  40 mg Per Tube Daily   baclofen  5 mg Per Tube QHS   carvedilol  25 mg Per Tube BID WC   chlorhexidine gluconate (MEDLINE KIT)  15 mL Mouth Rinse BID   Chlorhexidine Gluconate Cloth  6 each Topical Daily   diclofenac Sodium  4 g Topical QID   doxazosin  2 mg Per Tube Daily   enoxaparin  (LOVENOX) injection  40 mg Subcutaneous Q24H   feeding supplement  237 mL Per Tube TID BM   feeding supplement (JEVITY 1.5 CAL/FIBER)  750 mL Per Tube Q24H   feeding supplement (PROSource TF)  45 mL Per Tube Daily   free water  200 mL Per Tube Q4H   hydrALAZINE  37.5 mg Per Tube Q8H   mouth rinse  15 mL Mouth Rinse 5 X Daily   modafinil  200 mg Per Tube Daily   multivitamin with minerals  1 tablet Per Tube Daily   pantoprazole sodium  40 mg Per Tube Daily   Continuous Infusions:  sodium chloride Stopped (10/26/20 1445)     LOS: 226 days    Time spent: 35 minutes     Marta Bouie A Nial Hawe, MD [facility]   If 7PM-7AM, please contact night-coverage [email or web address]  04/10/2021, 10:00 AM

## 2021-04-11 NOTE — Plan of Care (Signed)
  Problem: Coping: Goal: Will identify appropriate support needs 04/11/2021 1550 by Drue Dun, RN Outcome: Progressing 04/11/2021 1550 by Drue Dun, RN Outcome: Progressing   Problem: Health Behavior/Discharge Planning: Goal: Ability to manage health-related needs will improve 04/11/2021 1550 by Drue Dun, RN Outcome: Progressing 04/11/2021 1550 by Drue Dun, RN Outcome: Progressing   Problem: Self-Care: Goal: Verbalization of feelings and concerns over difficulty with self-care will improve 04/11/2021 1550 by Drue Dun, RN Outcome: Progressing 04/11/2021 1550 by Drue Dun, RN Outcome: Progressing Goal: Ability to communicate needs accurately will improve 04/11/2021 1550 by Drue Dun, RN Outcome: Progressing 04/11/2021 1550 by Drue Dun, RN Outcome: Progressing   Problem: Nutrition: Goal: Risk of aspiration will decrease 04/11/2021 1550 by Drue Dun, RN Outcome: Progressing 04/11/2021 1550 by Drue Dun, RN Outcome: Progressing   Problem: Intracerebral Hemorrhage Tissue Perfusion: Goal: Complications of Intracerebral Hemorrhage will be minimized 04/11/2021 1550 by Drue Dun, RN Outcome: Progressing 04/11/2021 1550 by Drue Dun, RN Outcome: Progressing   Problem: Education: Goal: Knowledge of General Education information will improve Description: Including pain rating scale, medication(s)/side effects and non-pharmacologic comfort measures 04/11/2021 1550 by Drue Dun, RN Outcome: Progressing 04/11/2021 1550 by Drue Dun, RN Outcome: Progressing   Problem: Health Behavior/Discharge Planning: Goal: Ability to manage health-related needs will improve 04/11/2021 1550 by Drue Dun, RN Outcome: Progressing 04/11/2021 1550 by Drue Dun, RN Outcome: Progressing   Problem: Clinical Measurements: Goal: Ability to maintain clinical measurements within normal limits will  improve 04/11/2021 1550 by Drue Dun, RN Outcome: Progressing 04/11/2021 1550 by Drue Dun, RN Outcome: Progressing Goal: Will remain free from infection 04/11/2021 1550 by Drue Dun, RN Outcome: Progressing 04/11/2021 1550 by Drue Dun, RN Outcome: Progressing Goal: Diagnostic test results will improve 04/11/2021 1550 by Drue Dun, RN Outcome: Progressing 04/11/2021 1550 by Drue Dun, RN Outcome: Progressing Goal: Respiratory complications will improve 04/11/2021 1550 by Drue Dun, RN Outcome: Progressing 04/11/2021 1550 by Drue Dun, RN Outcome: Progressing Goal: Cardiovascular complication will be avoided 04/11/2021 1550 by Drue Dun, RN Outcome: Progressing 04/11/2021 1550 by Drue Dun, RN Outcome: Progressing   Problem: Coping: Goal: Level of anxiety will decrease 04/11/2021 1550 by Drue Dun, RN Outcome: Progressing 04/11/2021 1550 by Drue Dun, RN Outcome: Progressing   Problem: Elimination: Goal: Will not experience complications related to bowel motility 04/11/2021 1550 by Drue Dun, RN Outcome: Progressing 04/11/2021 1550 by Drue Dun, RN Outcome: Progressing Goal: Will not experience complications related to urinary retention 04/11/2021 1550 by Drue Dun, RN Outcome: Progressing 04/11/2021 1550 by Drue Dun, RN Outcome: Progressing   Problem: Pain Managment: Goal: General experience of comfort will improve 04/11/2021 1550 by Drue Dun, RN Outcome: Progressing 04/11/2021 1550 by Drue Dun, RN Outcome: Progressing   Problem: Skin Integrity: Goal: Risk for impaired skin integrity will decrease 04/11/2021 1550 by Drue Dun, RN Outcome: Progressing 04/11/2021 1550 by Drue Dun, RN Outcome: Progressing   Problem: Intracerebral Hemorrhage Tissue Perfusion: Goal: Complications of Intracerebral Hemorrhage will be minimized 04/11/2021  1550 by Drue Dun, RN Outcome: Progressing 04/11/2021 1550 by Drue Dun, RN Outcome: Progressing   Problem: Ischemic Stroke/TIA Tissue Perfusion: Goal: Complications of ischemic stroke/TIA will be minimized 04/11/2021 1550 by Drue Dun, RN Outcome: Progressing 04/11/2021 1550 by Drue Dun, RN Outcome: Progressing

## 2021-04-11 NOTE — Progress Notes (Signed)
PROGRESS NOTE    Charlene [name]  VQQ:[id number] DOB: 08-30-1965 DOA: 08/26/2020 PCP: Patient, [name] (Inactive)   Brief Narrative: 55 year old with hypertensive right thalamic/basal ganglia ICH with resultant hydrocephalus prompting external ventricular drain placement on 2/10.  Subsequently on 2/10, she developed obtundation and respiratory arrest leading to intubation.  She was persistently obtunded , with subsequent trach and PEG.  Hospital course complicated by persistent fever, Serratia pneumonia, preorbital cellulitis.  She was a started on Provigil with improvement in alertness.  Most day patient is able to track with encouragement, able to follow simple commands especially since partial obstruction glasses placed.  Patient care was transferred to [facility] on 3/1.  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  -Neurology Sign off 2/25 -stable.   2-Tracheostomy Dependent:  _PCCM/trach team follow for trach  care. -Patient  is considered high risk for decannulation.  Plan is to continue with trach  3-microcytic anemia previous iron deficiency Hb stable.   Completed course of antibiotics for Pseudomonas UTI ESBL UTI.  Constipation: Had a bowel movement 9/21st  Hypertensive emergency malignant hypertension: Continue Norvasc beta-blocker and hydralazine Leukopenia; monitor.  Dysphagia 1 diet, continue with tube feedings History of urinary retention: Continue with Cardura Right upper extremity thrombophlebitis: Currently on DVT prophylaxis Recurrent right knee/no ligamentous injuries and chronic osteoarthritis: Hyperlipidemia on Lipitor   Nutrition Problem: Inadequate oral intake Etiology: inability to eat    Signs/Symptoms: NPO status    Interventions: Ensure Enlive (each supplement provides 350kcal and 20 grams of protein), MVI, Tube feeding, Calorie Count, Prostat  Estimated body mass index is 24.95 kg/m as calculated from the following:   Height as of this encounter: '5\' 5"'  (1.651 m).   Weight as of this encounter: 68 kg.   DVT prophylaxis: Lovenox Code Status: Full code Family Communication: Husband who was at bedside 9/24 Disposition Plan:  Status is: Inpatient  Remains inpatient appropriate because:IV treatments appropriate due to intensity of illness or inability to take PO  Dispo: The patient is from: Home              Anticipated d/c is to: SNF  that could accept  patient with trach              Patient currently is not medically stable to d/c.   Difficult to place patient Yes        Consultants:  Neurology CCM  Procedures:    Antimicrobials:    Subjective: She is alert, staff is cleaning her.   Objective: Vitals:   04/11/21 0500 04/11/21  0515 04/11/21 0823 04/11/21 0909  BP:    130/82  Pulse:  87 77 84  Resp:  '16 16 20  ' Temp:    98.8 F (37.1 C)  TempSrc:    Oral  SpO2:  97% 98% 98%  Weight: 68 kg     Height:       No intake or output  data in the 24 hours ending 04/11/21 1113  Filed Weights   04/09/21 0500 04/10/21 0500 04/11/21 0500  Weight: 67.4 kg 67.4 kg 68 kg    Examination:  General exam: NAD, Trach in place Respiratory system: CTA Cardiovascular system: S 1, S 2 RRR.  Gastrointestinal system: NT, BS Peg in place Central nervous system: Alert moves right arm, right leg. Left arm contracted   Data Reviewed: I have personally reviewed following labs and imaging studies  CBC: Recent Labs  Lab 04/05/21 0757 04/10/21 0848  WBC 3.6* 3.9*  HGB 10.6* 10.1*  HCT 32.7* 32.5*  MCV 77.7* 79.5*  PLT 250 122    Basic Metabolic Panel: Recent Labs  Lab 04/05/21 0757 04/10/21 0848  NA 140 139  K 3.8 3.8  CL 102 102  CO2 28 31  GLUCOSE 129* 110*  BUN 19 16  CREATININE 0.56 0.59  CALCIUM 9.4 9.4  MG 1.9 1.9  PHOS 3.9  --     GFR: Estimated Creatinine Clearance: 72.3 mL/min (by C-G formula based on SCr of 0.59 mg/dL). Liver Function Tests: Recent Labs  Lab 04/05/21 0757  AST 28  ALT 58*  ALKPHOS 83  BILITOT 0.4  PROT 6.7  ALBUMIN 3.5    No results for input(s): LIPASE, AMYLASE in the last 168 hours. No results for input(s): AMMONIA in the last 168 hours. Coagulation Profile: No results for input(s): INR, PROTIME in the last 168 hours. Cardiac Enzymes: No results for input(s): CKTOTAL, CKMB, CKMBINDEX, TROPONINI in the last 168 hours. BNP (last 3 results) No results for input(s): PROBNP in the last 8760 hours. HbA1C: No results for input(s): HGBA1C in the last 72 hours. CBG: Recent Labs  Lab 04/07/21 0247 04/08/21 0237 04/08/21 0617 04/09/21 0616 04/09/21 1553  GLUCAP 128* 133* 166* 151* 113*    Lipid Profile: No results for input(s): CHOL, HDL, LDLCALC, TRIG, CHOLHDL, LDLDIRECT in the last 72 hours. Thyroid Function Tests: No results for input(s): TSH, T4TOTAL, FREET4, T3FREE, THYROIDAB in the last 72 hours. Anemia Panel: No results for input(s): VITAMINB12, FOLATE,  FERRITIN, TIBC, IRON, RETICCTPCT in the last 72 hours. Sepsis Labs: No results for input(s): PROCALCITON, LATICACIDVEN in the last 168 hours.  No results found for this or any previous visit (from the past 240 hour(s)).       Radiology Studies: No results found.      Scheduled Meds:  acetaminophen  650 mg Per Tube Q6H   amLODipine  10 mg Per Tube Daily   atorvastatin  40 mg Per Tube Daily   baclofen  5 mg Per Tube QHS   carvedilol  25 mg Per Tube BID WC   chlorhexidine gluconate (MEDLINE KIT)  15 mL Mouth Rinse BID   Chlorhexidine Gluconate Cloth  6 each Topical Daily   diclofenac Sodium  4 g Topical QID   doxazosin  2 mg Per Tube Daily   enoxaparin (LOVENOX) injection  40 mg Subcutaneous Q24H   feeding supplement  237 mL Per Tube TID BM   feeding supplement (JEVITY 1.5 CAL/FIBER)  750 mL Per Tube Q24H   feeding supplement (PROSource  TF)  45 mL Per Tube Daily   free water  200 mL Per Tube Q4H   hydrALAZINE  37.5 mg Per Tube Q8H   mouth rinse  15 mL Mouth Rinse 5 X Daily   modafinil  200 mg Per Tube Daily   multivitamin with minerals  1 tablet Per Tube Daily   pantoprazole sodium  40 mg Per Tube Daily   Continuous Infusions:  sodium chloride Stopped (10/26/20 1445)     LOS: 227 days    Time spent: 35 minutes     Bassy Fetterly A Donnetta Gillin, MD [facility]   If 7PM-7AM, please contact night-coverage [email or web address]  04/11/2021, 11:13 AM

## 2021-04-12 DIAGNOSIS — H60393 Other infective otitis externa, bilateral: Secondary | ICD-10-CM | POA: Insufficient documentation

## 2021-04-12 LAB — GLUCOSE, CAPILLARY
Glucose-Capillary: 172 mg/dL — ABNORMAL HIGH (ref 70–99)
Glucose-Capillary: 156 mg/dL — ABNORMAL HIGH (ref 70–99)
Glucose-Capillary: 139 mg/dL — ABNORMAL HIGH (ref 70–99)
Glucose-Capillary: 120 mg/dL — ABNORMAL HIGH (ref 70–99)
Glucose-Capillary: 98 mg/dL (ref 70–99)
Glucose-Capillary: 146 mg/dL — ABNORMAL HIGH (ref 70–99)

## 2021-04-12 MED ORDER — CIPROFLOXACIN-FLUOCINOLONE PF 0.3-0.025 % OT SOLN
0.2500 mL | Freq: Two times a day (BID) | OTIC | Status: DC
  Administered 2021-04-12 – 2021-04-16 (×9): 0.25 mL via OTIC
  Filled 2021-04-12: qty 3.5

## 2021-04-12 MED ORDER — FLUTICASONE PROPIONATE 50 MCG/ACT NA SUSP
2.0000 | Freq: Every day | NASAL | Status: DC
  Administered 2021-04-12 – 2021-04-16 (×5): 2 via NASAL
  Filled 2021-04-12: qty 16

## 2021-04-12 NOTE — Progress Notes (Signed)
[facility] PROGRESS NOTE  Charlene [name] EUM:[id number] DOB: 1966-03-18 DOA: 08/26/2020 PCP: Patient, [name] (Inactive)    October 08, 2020     10/16/2020                        10/16/2020     10/18/2020                        10/20/2020      12/30/2020                   12/30/2020     01/04/2021                   02/24/2021 Viv really needs a teddy bear!   Status: Remains inpatient appropriate because:Altered mental status and Unsafe d/c plan: Requirement to discharge to trach capable SNF   Dispo: The patient is from: Home              Anticipated d/c is to: SNF trach capable-clinicals resubmitted to multiple facilities as of 8/31.  Unable to decannulate 2/2 and lack of appropriate voluntary and involuntary cough response              Patient currently is medically stable to d/c.   Difficult to place patient Yes         Barriers to DC: Continued requirement for right hand mitt to prevent self decannulation.  This is considered a restraint by SNFs. patient is not doing this by accident she is purposefully D cannulating herself.   Level of care: Telemetry Medical  Code Status: Full Family Communication: Extensive conversation held with husband [name] by phone on 9/21   DVT prophylaxis: Chronically bedbound so Lovenox discontinued   Vaccination status: Unvaccinated-husband has agreed to [employer] vaccine but only if required by accepting facility  Micro Data:  2/9 COVID + Influenza negative 2/10 MRSA PCR negative 2/12 BCx 2 > no growth  2/19 BC 1/2 +Stap EPI, 1/2 no growth  10/07/2020 sputum positive for Serratia marcescens 4/8 Enterobacter cloaca + urine culture MDR sensitive only to imipenem and gentamicin 5/20 urine positive for Klebsiella resistant to ampicillin and Macrodantin 6/2 urine positive for Klebsiella resistant to ampicillin, cefazolin and Macrodantin 7/4 urine positive for ESBL Klebsiella resistant to ampicillin, cefazolin and Macrodantin 7/28 urine positive for  Enterobacter resistant to cefazolin, cefepime and Septra 9/2 urine positive for pansensitive Pseudomonas     Antimicrobials:  Cefepime 2/12 > 2/15 Vanc 2/12 > 2/15 10/08/2020 ciprofloxacin>>3/30 Unasyn 3/30 >> 4/02 Rocephin 4/11 > 4/12 Meropenem 4/12>4/17 Meropenem 5/20 through 5/22 Ceftriaxone 5/22 through 5/26 Meropenem 6/3 Ceftriaxone 6/4 through 6/10 Septra 7/4 through 7/7 Ceftriaxone 7/29 was 1 dose followed by 1 dose of fosfomycin Cipro 9/2 >>   HPI: 55 yo F with 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  Subsequently on 2/10, she developed obtundation and respiratory arrest leading to intubation.  She has remained persistently obtunded, now has a trach and a PEG.  Hospital course complicated by persistent fever, Serratia pneumonia and now pre-orbital cellulitis  As of 3/25 with adjustments in hypertonicity medications patient began to improve regarding hypertonicity.  Eventually started on provigil with improvements in alertness.  Evaluated by rehab physician who recommended increasing provigil dose further.  Subsequently patient has become more alert.  Most days she is able to track and with encouragement able to follow simple  commands especially since partial obstruction glasses placed.  See progress note dictated 4/27 for expanded details.   Significant events: 2/09 Admit for ICH 2/10 EVD placed, later intubated due to obtundation and respiratory arrest 2/11 MRI Brain, hypertonic saline d/c'd, EVD not working.  Husband did not consent to PICC  2/13 EVD removed 2/17 [place] discussion with family, PMT 2/24 Bedside trach and PEG 3/1 Transferred to [facility]  Subjective: Awake.  When questioned about reported ear discomfort she pointed to both ears.  Objective: Vitals:   04/12/21 0418 04/12/21 0745  BP: 122/83 123/74  Pulse: 87 88  Resp: 18 16  Temp:  98.8 F (37.1 C)  SpO2: 100% 100%    Filed Weights   04/09/21 0500 04/10/21 0500 04/11/21 0500  Weight: 67.4 kg 67.4 kg 68 kg    Exam:       Gen: Awake and alert         ENT: Bilateral TMs are pink and slightly distended.  Border/rim darkened note clear fluid in inner ear canal bilaterally.  Right TM either scarred or has an area of lipid collection. Respiratory: #4.0 cuffless trach, trach collar with FiO2 21% at 5L; lateral anterior lung sounds remain clear to auscultation and no increased work of breathing. Cardiovascular: S1S2, regular pulse, no peripheral edema, skin warm and dry to the touch with adequate capillary refill Abdomen:  PEG /nocturnal tube feedings. LBM 9/20, nondistended, normoactive bowel sounds.   Neurological: CN 2 -12 intact. Spontaneous purposeful movement of right upper extremity/strength 4/5.   RLE strength 2/5 with purposeful movement.  Persistent left-side neglect.  Per SLP note today patient with decreased ability to eat and contain p.o.'s orally since 9/21 and remains n.p.o. but is allowed tube feedings. Psychiatric: Awake and alert -less flat and more interactive today  Assessment/Plan: Acute problems: Multiple acute strokes  Right Thalamus ICH with IVH  Obstructive Hydrocephalus/left-sided neglect /Non TBI related hypertonicity -Continue HS baclofen -Continues with LUE neglect requiring soft splint.  Moves both LEs right much greater than left -Husband would like to participate with therapies.  He is available every Tuesday at 11 AM.  Message sent to therapy team  Bilateral ear pain/acute otitis externa -Begin Flonase to assist with opening up eustachian tube -Begin Cipro/steroid otic 2 drops BID for 7 days  Tracheostomy dependent -PCCM/trach team following closely regarding candidacy for decannulation. -Husband and family agree risk outweighs benefit to decannulate -9/23 plan to remove mitt to see how patient tolerates-hopefully she will not pull any more trach since she has been  repeatedly admonished over the past 2 weeks not do so.  Microcytic anemia with known previous iron deficiency Continues with low MCV and hemoglobin 11-repeat anemia panel in September normal  Fever 2/2 Pseudomonas UTI/history of ESBL Klebsiella UTI Completed 7 days of Cipro Continue contact isolation  Constipation -Continue prn MiraLAX -7 days between last bowel movement as of 9/19 so was given MOM x 1 and subsequently had a bowel movement on 9/20.  Today 9/21 was on bedpan upon my entry to the room.  Hypertensive emergency/Malignant HTN -Continue Norvasc, beta-blocker and hydralazine.  Blood pressure well controlled  Dysphagia -Continue dysphagia 1 with thin liquids-Inconsistent oral intake therefore -continue nocturnal tube feedings  History of urinary retention -Continue Cardura  Depression -Discussed current symptoms with patient's husband noting that it is perfectly understandable that patient would be sad and depressed -Explained how SSRIs work and this would be my recommendation for a medication.  Both he and Viv historically do  not like to take medications he will think about it and discuss with the family given her symptoms seem to be somewhat severe and be influencing her ability to participate with therapies and her desire to eat  Right upper extremity thrombophlebitis - [name] -> age indeterminate SVT of right cephalic vein-RUE ultrasound pending-as a precaution attending physician on 9/20 opted to initiate pharmacologic VTE prophylaxis after clearing with NS Xu -Duplex 9/21 w/o SVT or DVT  Neck Contracture/torticollis -- Resolved  Recurrent right knee pain/known ligamentous injury and chronic osteoarthritis -s/p injection of Marcaine and Depomedrol 6/30 -9/19 given blackbox warnings regarding stroke and MI dc'd preadmission Celebrex  Chronic pansinusitis/recurrent fevers 2/2 aspiration PNA;History of Serratia Pneumonia  Hospital Acquired Pneumonia:  -Completed several  courses of antibiotics -Follow-up imaging including sinus CT consistent with chronic but stable and resolving sinusitis  HLD -Continue Lipitor  Data Reviewed: CBG: Recent Labs  Lab 04/07/21 0247 04/08/21 0237 04/08/21 0617 04/09/21 0616 04/09/21 1553  GLUCAP 128* 133* 166* 151* 113*       Scheduled Meds:  acetaminophen  650 mg Per Tube Q6H   amLODipine  10 mg Per Tube Daily   atorvastatin  40 mg Per Tube Daily   baclofen  5 mg Per Tube QHS   carvedilol  25 mg Per Tube BID WC   chlorhexidine gluconate (MEDLINE KIT)  15 mL Mouth Rinse BID   Chlorhexidine Gluconate Cloth  6 each Topical Daily   diclofenac Sodium  4 g Topical QID   doxazosin  2 mg Per Tube Daily   enoxaparin (LOVENOX) injection  40 mg Subcutaneous Q24H   feeding supplement  237 mL Per Tube TID BM   feeding supplement (JEVITY 1.5 CAL/FIBER)  750 mL Per Tube Q24H   feeding supplement (PROSource TF)  45 mL Per Tube Daily   free water  200 mL Per Tube Q4H   hydrALAZINE  37.5 mg Per Tube Q8H   mouth rinse  15 mL Mouth Rinse 5 X Daily   modafinil  200 mg Per Tube Daily   multivitamin with minerals  1 tablet Per Tube Daily   pantoprazole sodium  40 mg Per Tube Daily   Continuous Infusions:  sodium chloride Stopped (10/26/20 1445)    Principal Problem:   ICH (intracerebral hemorrhage) ([place]) Active Problems:   Acute hypoxemic respiratory failure (HCC)   Tracheostomy dependent (HCC)   Palliative care by specialist   Muscle hypertonicity   Oropharyngeal dysphagia   Poor prognosis   Aspiration pneumonia of right lower lobe (HCC)   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  Palliative care.   PCCM continues to follow.  No decannulation.   Procedures: Tracheostomy PEG tube EEG Echocardiogram  Antibiotics: Vancomycin 2/12-2/15 Maxipime 2/12-2/15 Ancef 2/19-2/27 Vancomycin 2/20 x 1 dose Maxipime 2/27- 3/5 Vancomycin 3/1-3/3 Ancef 3/6-3/14 Cipro 3/20 4-3/30 Unasyn 3/30-4/4 Rocephin 4/11-4/12 Meropenem 4/12-4/17 Cipro 9/2 >> 9/8   Time spent: 15 minutes    Erin Hearing ANP  [facility] 7 am - 330 pm/M-F for direct patient care and secure chat Please refer to [place] for contact info [phone]  Days

## 2021-04-12 NOTE — Progress Notes (Signed)
Speech Language Pathology Treatment: Dysphagia;Cognitive-Linquistic  Patient Details Name: Charlene [name] MRN: [id number] DOB: [birth year] Today's Date: 04/12/2021 Time: 0910-1008 SLP Time Calculation (min) (ACUTE ONLY): 58 min  Assessment / Plan / Recommendation Clinical Impression  Spent lengthy session working with pt and her mother, Charlene [name].  Charlene [name] was repositioned to optimize participation. She brushed her teeth and oral suctioned with intermittent physical assist and verbal cues.  She ate minimally from her tray - holding cup to sip cranberry juice and requiring physical assist for self-feeding (alternating being fed by her mother.) Demonstrated inconsistent oral holding, requiring verbal cues to swallow and occasionally needing suctioning to remove residue. There were no s/s of aspiration - but her swallowing deficits have been primarily oral since she has been on a diet. Her initiation to eat and actively chew/swallow really waxes and wanes.   No oral communication could be facilitated today.  Charlene [name] has been able to write sentences spontaneously on occasion - they are self-generated and grammatically and semantically correct.  Other days, her writing is marked by word perseverations.  Today, she generated some single word answers to written questions, requiring cues to initiate written response with marker.  As the session progressed, she became too sleepy to continue.  Talked with Charlene [name] about ways to continue to help Charlene [name] - e.g. not doing all the work of self care or feeding and allowing Charlene [name] opportunities to help with brushing her teeth, washing her face, or feeding. Charlene [name] verbalized understanding.  SLP will continue to follow for communication and swallowing.    HPI HPI: Pt is 55 y/o female presents to [facility] on 2/9 with L sided weakness and headaches. PMH includes anxiety, HTN. CT on 2/10 shows right thalamic ICH extending into the intraventricular third and fourth ventricles and  nearly filling the right lateral ventricle with no overt sign of hydrocephalus. S/p Right frontal ventricular catheter placement on 2/10, stopped functioning on 2/11.  MRI 2/11 shows right greater than left mesial temporal lobes / parahippocampal cortex, bilateral basal ganglia, and possibly the upper pons that are concerning for acute infarcts, most likely secondary to mass effect from the hemorrhage. ETT 2/10; trach/PEG 09/10/20.  Trach changed to #6 cuffless 3/11; #4 cuffless 5/4. Has had two MBS studies, the last on 7/8 - pt started on dysphagia 1, thin liquids. Trach replaced following self-decannulation 7/21, again on 8/23.      SLP Plan  Continue with current plan of care      Recommendations for follow up therapy are one component of a multi-disciplinary discharge planning process, led by the attending physician.  Recommendations may be updated based on patient status, additional functional criteria and insurance authorization.    Recommendations  Diet recommendations: Dysphagia 1 (puree);Thin liquid Liquids provided via: Cup;Straw Medication Administration: Via alternative means Supervision: Staff to assist with self feeding;Trained caregiver to feed patient;Full supervision/cueing for compensatory strategies Compensations: Minimize environmental distractions;Small sips/bites Postural Changes and/or Swallow Maneuvers: Seated upright 90 degrees      Patient may use Passy-Muir Speech Valve: During all waking hours (remove during sleep);During PO intake/meals PMSV Supervision: Intermittent         Plan: Continue with current plan of care       GO              Charlene [name] L. Samson [name], MA CCC/SLP Acute Rehabilitation Services Office number [phone] Pager [phone]   Charlene [name] Charlene [name]  04/12/2021, 10:16 AM

## 2021-04-12 NOTE — Progress Notes (Signed)
CSW spoke with Elisa, RN who states patient is not currently wearing a mitt but that she did wear one last night.  Edwin Dada, MSW, LCSW Transitions of Care  Clinical Social Worker II [phone]

## 2021-04-12 NOTE — Procedures (Signed)
Tracheostomy Change Note  Patient Details:   Name: Charlene [name] DOB: 10-01-1965 MRN: [id number]    Airway Documentation:     Evaluation  O2 sats: stable throughout Complications: No apparent complications Patient did tolerate procedure well. Bilateral Breath Sounds: Clear, Diminished    RTx2 changed trach per MD order for monthly change. Patient tolerated well. Positive color changed noted. Trach secured with trach ties. RT will continue to monitor.   Jaquelyn Bitter 04/12/2021, 4:03 PM

## 2021-04-13 LAB — GLUCOSE, CAPILLARY
Glucose-Capillary: 96 mg/dL (ref 70–99)
Glucose-Capillary: 98 mg/dL (ref 70–99)
Glucose-Capillary: 91 mg/dL (ref 70–99)

## 2021-04-13 NOTE — Progress Notes (Signed)
[facility] PROGRESS NOTE  Charlene [name] WGN:[id number] DOB: 07-Jan-1966 DOA: 08/26/2020 PCP: Patient, [name] (Inactive)    October 08, 2020     10/16/2020                        10/16/2020     10/18/2020                        10/20/2020      12/30/2020                   12/30/2020     01/04/2021                   02/24/2021 Charlene [name] really needs a teddy bear!   Status: Remains inpatient appropriate because:Altered mental status and Unsafe d/c plan: Requirement to discharge to trach capable SNF   Dispo: The patient is from: Home              Anticipated d/c is to: SNF trach capable-Unable to decannulate 2/2 and lack of appropriate voluntary and involuntary cough response              Patient currently is medically stable to d/c.   Difficult to place patient Yes         Barriers to DC: Currently beginning a trial of removal of right hand mitten to see how she tolerates in regards to pulling at trach tube   Level of care: Telemetry Medical  Code Status: Full Family Communication: Extensive conversation held with husband Charlene [name] by phone on 9/27 DVT prophylaxis: Chronically bedbound so Lovenox discontinued   Vaccination status: Unvaccinated-husband has agreed to [employer] vaccine but only if required by accepting facility  Micro Data:  2/9 COVID + Influenza negative 2/10 MRSA PCR negative 2/12 BCx 2 > no growth  2/19 BC 1/2 +Stap EPI, 1/2 no growth  10/07/2020 sputum positive for Serratia marcescens 4/8 Enterobacter cloaca + urine culture MDR sensitive only to imipenem and gentamicin 5/20 urine positive for Klebsiella resistant to ampicillin and Macrodantin 6/2 urine positive for Klebsiella resistant to ampicillin, cefazolin and Macrodantin 7/4 urine positive for ESBL Klebsiella resistant to ampicillin, cefazolin and Macrodantin 7/28 urine positive for Enterobacter resistant to cefazolin, cefepime and Septra 9/2 urine positive for pansensitive Pseudomonas     Antimicrobials:   Cefepime 2/12 > 2/15 Vanc 2/12 > 2/15 10/08/2020 ciprofloxacin>>3/30 Unasyn 3/30 >> 4/02 Rocephin 4/11 > 4/12 Meropenem 4/12>4/17 Meropenem 5/20 through 5/22 Ceftriaxone 5/22 through 5/26 Meropenem 6/3 Ceftriaxone 6/4 through 6/10 Septra 7/4 through 7/7 Ceftriaxone 7/29 was 1 dose followed by 1 dose of fosfomycin Cipro 9/2 >>   HPI: 55 yo F with 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  Subsequently on 2/10, she developed obtundation and respiratory arrest leading to intubation.  She has remained persistently obtunded, now has a trach and a PEG.  Hospital course complicated by persistent fever, Serratia pneumonia and now pre-orbital cellulitis  As of 3/25 with adjustments in hypertonicity medications patient began to improve regarding hypertonicity.  Eventually started on provigil with improvements in alertness.  Evaluated by rehab physician who recommended increasing provigil dose further.  Subsequently patient has become more alert.  Most days she is able to track and with encouragement able to follow simple commands especially since partial obstruction glasses placed.  See progress note dictated 4/27 for expanded details.   Significant events:  2/09 Admit for [place] 2/10 EVD placed, later intubated due to obtundation and respiratory arrest 2/11 MRI Brain, hypertonic saline d/c'd, EVD not working.  Husband did not consent to PICC  2/13 EVD removed 2/17 [place] discussion with family, PMT 2/24 Bedside trach and PEG 3/1 Transferred to [place]  Subjective: Able to determine from [name] that her ears do feel somewhat better after initiation of Cipro otic yesterday  Objective: Vitals:   04/13/21 0436 04/13/21 0632  BP:  (!) 151/99  Pulse: 91 93  Resp: 16 20  Temp:  98.7 F (37.1 C)  SpO2: 97% 98%   Filed Weights   04/09/21 0500 04/10/21 0500 04/11/21 0500  Weight: 67.4 kg 67.4 kg 68 kg    Exam:        Gen: Awake and alert  Respiratory: #4.0 cuffless trach, trach collar with FiO2 21% at 5L; lung sounds remain clear, no increased work of breathing, no tracheal secretions. Cardiovascular: Normal heart sounds, normotensive, no peripheral edema Abdomen:  PEG /nocturnal tube feedings. LBM 9/26, nondistended, normoactive bowel sounds.   Neurological: CN 2 -12 intact. Purposeful movement of right upper extremity--strength 4/5.   RLE strength 1-2/5 w/ purposeful movement.  Persistent left-side neglect.  Psychiatric: Awake and alert  Assessment/Plan: Acute problems: Multiple acute strokes  Right Thalamus ICH with IVH  Obstructive Hydrocephalus/left-sided neglect /Non TBI related hypertonicity -Continue HS baclofen -Continues with LUE neglect requiring soft splint.  Moves both LEs right much greater than left -Husband would like to participate with therapies.  He is available every Tuesday at 11 AM.  Message sent to therapy team  Bilateral ear pain/acute otitis externa  -Continue Flonase to assist with opening up eustachian tube -Continue Cipro/steroid otic 2 drops BID for 7 days  Tracheostomy dependent -PCCM/trach team following closely regarding candidacy for decannulation. -Husband and family agree risk outweighs benefit to decannulate -9/23 plan to remove mitt to see how patient tolerates-hopefully she will not pull any more trach since she has been repeatedly admonished over the past 2 weeks not do so. -Throughout most of the day patient was successful keeping right mitt off.  When family left they insisted that the mitten be replaced due to safety concerns. -Discussed with husband Charlene [name] who agrees with leaving mitten off.  He also agrees that based on his conversations with the trach team that Charlene [name] is able to tolerate decannulation short periods of time and he is confident that if trach removed by Charlene [name] she would tolerate this until trach could be replaced  Microcytic anemia with known  previous iron deficiency Continues with low MCV and hemoglobin 11-repeat anemia panel in September normal  Fever 2/2 Pseudomonas UTI/history of ESBL Klebsiella UTI Completed 7 days of Cipro Continue contact isolation  Constipation -Continue prn MiraLAX -7 days between last bowel movement as of 9/19 so was given MOM x 1 and subsequently had a bowel movement on 9/20.  Today 9/21 was on bedpan upon my entry to the room.  Hypertensive emergency/Malignant HTN -Continue Norvasc, beta-blocker and hydralazine.  Blood pressure well controlled  Dysphagia -Continue dysphagia 1 with thin liquids-Inconsistent oral intake therefore -continue nocturnal tube feedings  History of urinary retention -Continue Cardura  Depression -Discussed current symptoms with patient's husband noting that it is perfectly understandable that patient would be sad and depressed -Explained how SSRIs work and this would be my recommendation for a medication.  Both he and Charlene [name] historically do not like to take medications he will think about it and discuss with the  family given her symptoms seem to be somewhat severe and be influencing her ability to participate with therapies and her desire to eat  Right upper extremity thrombophlebitis - [name] -> age indeterminate SVT of right cephalic vein-RUE ultrasound pending-as a precaution attending physician on 9/20 opted to initiate pharmacologic VTE prophylaxis after clearing with NS Xu -Duplex 9/21 w/o SVT or DVT  Neck Contracture/torticollis -- Resolved  Recurrent right knee pain/known ligamentous injury and chronic osteoarthritis -s/p injection of Marcaine and Depomedrol 6/30 -9/19 given blackbox warnings regarding stroke and MI dc'd preadmission Celebrex  Chronic pansinusitis/recurrent fevers 2/2 aspiration PNA;History of Serratia Pneumonia  Hospital Acquired Pneumonia:  -Completed several courses of antibiotics -Follow-up imaging including sinus CT consistent with chronic  but stable and resolving sinusitis  HLD -Continue Lipitor  Data Reviewed: CBG: Recent Labs  Lab 04/10/21 0603 04/11/21 0223 04/11/21 0606 04/11/21 1708 04/12/21 0622  GLUCAP 156* 139* 120* 98 146*       Scheduled Meds:  acetaminophen  650 mg Per Tube Q6H   amLODipine  10 mg Per Tube Daily   atorvastatin  40 mg Per Tube Daily   baclofen  5 mg Per Tube QHS   carvedilol  25 mg Per Tube BID WC   chlorhexidine gluconate (MEDLINE KIT)  15 mL Mouth Rinse BID   Chlorhexidine Gluconate Cloth  6 each Topical Daily   ciprofloxacin-fluocinolone PF  0.25 mL Both EARS BID   diclofenac Sodium  4 g Topical QID   doxazosin  2 mg Per Tube Daily   enoxaparin (LOVENOX) injection  40 mg Subcutaneous Q24H   feeding supplement  237 mL Per Tube TID BM   feeding supplement (JEVITY 1.5 CAL/FIBER)  750 mL Per Tube Q24H   feeding supplement (PROSource TF)  45 mL Per Tube Daily   fluticasone  2 spray Each Nare Daily   free water  200 mL Per Tube Q4H   hydrALAZINE  37.5 mg Per Tube Q8H   mouth rinse  15 mL Mouth Rinse 5 X Daily   modafinil  200 mg Per Tube Daily   multivitamin with minerals  1 tablet Per Tube Daily   pantoprazole sodium  40 mg Per Tube Daily   Continuous Infusions:  sodium chloride Stopped (10/26/20 1445)    Principal Problem:   ICH (intracerebral hemorrhage) ([place]) Active Problems:   Acute hypoxemic respiratory failure (HCC)   Tracheostomy dependent (HCC)   Palliative care by specialist   Muscle hypertonicity   Oropharyngeal dysphagia   Poor prognosis   Aspiration pneumonia of right lower lobe (HCC)   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  Palliative care.   PCCM continues to follow.  No decannulation.   Procedures: Tracheostomy PEG tube EEG Echocardiogram  Antibiotics: Vancomycin 2/12-2/15 Maxipime 2/12-2/15 Ancef 2/19-2/27 Vancomycin 2/20 x 1 dose Maxipime 2/27- 3/5 Vancomycin 3/1-3/3 Ancef 3/6-3/14 Cipro 3/20 4-3/30 Unasyn 3/30-4/4 Rocephin 4/11-4/12 Meropenem 4/12-4/17 Cipro 9/2 >> 9/8   Time spent: 15 minutes    Erin Hearing ANP  [facility] 7 am - 330 pm/M-F for direct patient care and secure chat Please refer to Amion for contact  info 229  Days

## 2021-04-13 NOTE — Progress Notes (Signed)
Occupational Therapy Treatment Patient Details Name: Charlene [name] MRN: [id number] DOB: 10/04/1965 Today's Date: 04/13/2021   History of present illness Pt is 55 y/o female presents to [facility] on 08/26/20 with L sided weakness and headaches. 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 s/p Right frontal ventricular catheter placement on 2/10, stopped functioning on 2/11.  MRI 2/11 shows right greater than left mesial temporal lobes / parahippocampal cortex, bilateral basal ganglia, and possibly the upper pons that are concerning for acute infarcts, most likely secondary to mass effect from the hemorrhage. ETT 2/10-2/24.  Pt with trach and PEG on 09/10/20. Hospital course complicated by persistent fever, Serratia pneumonia and now pre-orbital cellulitis.   Pt self decannulated 02/04/21, trach re-inserted by RT. Pt dislodged PEG, guided replacement 8/2.  PMH includes anxiety, HTN.   OT comments  Patient supine in bed, utilize communication board (writing) with patient today.  Pt better with yes/no responses, but continues to be inconsistent with accuracy.  Pt responds no to needing to use the bathroom, but unable to communicate yes to having already used bathroom (possible decreased awareness vs communication). Plan to complete ADLs at EOB shifted to supine toileting due to bowel/bladder incontinence.  Requires total assist initially to roll fading to max assist as command following improved to assist with R UE with rolling. Total assist +2 for hygiene, max assist to don new gown (pt threading R UE with increased time).  Pt able to write "I'm all better now" at the end of the session in response to "are you comfortable?".  Spouse arrived at end of session, pt fatigued and falling asleep therefore discussed session with spouse.  Will follow.    Recommendations for follow up  therapy are one component of a multi-disciplinary discharge planning process, led by the attending physician.  Recommendations may be updated based on patient status, additional functional criteria and insurance authorization.    Follow Up Recommendations  SNF;Supervision/Assistance - 24 hour    Equipment Recommendations  None recommended by OT    Recommendations for Other Services      Precautions / Restrictions Precautions Precautions: Fall Precaution Comments: PEG, trach, no mitt today Required Braces or Orthoses: Other Brace Other Brace: bil. PRAFOs, soft R knee extension splint and soft L elbow extension splint Restrictions Weight Bearing Restrictions: No       Mobility Bed Mobility Overal bed mobility: Needs Assistance Bed Mobility: Rolling Rolling: Max assist;Total assist         General bed mobility comments: initally rolling with total assist for hygiene and linen change but pt able to progress towards using R UE to assist in rolling    Transfers                      Balance                                           ADL either performed or assessed with clinical judgement   ADL Overall ADL's : Needs assistance/impaired                 Upper Body Dressing : Maximal assistance;Bed level Upper Body Dressing Details (indicate cue type and reason): pt able to thread R UE into/out of gown with increased  time, but requires assist for remainer of dressing tasks         Toileting- Clothing Manipulation and Hygiene: Total assistance;+2 for physical assistance;+2 for safety/equipment;Bed level Toileting - Clothing Manipulation Details (indicate cue type and reason): Pt incontinent of urine and bowel, was assisted with peri care in supine/rolling     Functional mobility during ADLs: Maximal assistance General ADL Comments: limited to rolling in bed     Vision   Additional Comments: pt declining donning glasses, able to read and  responsd to questions via writing   Perception     Praxis      Cognition Arousal/Alertness: Awake/alert Behavior During Therapy: Flat affect Overall Cognitive Status: Impaired/Different from baseline Area of Impairment: Attention;Following commands;Problem solving;Awareness                   Current Attention Level: Sustained   Following Commands: Follows one step commands inconsistently   Awareness: Intellectual Problem Solving: Slow processing;Decreased initiation;Requires verbal cues;Requires tactile cues General Comments: pt inconsistently following commands, utilize written commands and pt able to respond by writing but unreliable responses.  Pt unaware she had used the bathroom, but able to report at end of the sesson "I"m all better now" writing.        Exercises Exercises: General Upper Extremity General Exercises - Upper Extremity Shoulder Flexion: PROM;Left;10 reps Shoulder ABduction: PROM;Left;10 reps Elbow Flexion: PROM;10 reps;Left Elbow Extension: Left;10 reps;PROM Wrist Flexion: Left;5 reps;PROM;Supine Wrist Extension: PROM;Left;10 reps   Shoulder Instructions       General Comments chair position in bed.  Saw pt at 11am to catch spouse, spouse arrived at end of session when pt is fatigued.  Discussed session with spouse.    Pertinent Vitals/ Pain       Pain Assessment: Faces Faces Pain Scale: Hurts little more Pain Location: bil legs; L elbow with PROM Pain Descriptors / Indicators: Grimacing;Guarding Pain Intervention(s): Limited activity within patient's tolerance;Monitored during session;Repositioned  Home Living                                          Prior Functioning/Environment              Frequency  Min 1X/week        Progress Toward Goals  OT Goals(current goals can now be found in the care plan section)  Progress towards OT goals: Progressing toward goals (slowly)  Acute Rehab OT Goals Patient  Stated Goal: unable to state  Plan Discharge plan remains appropriate;Frequency remains appropriate    Co-evaluation                 AM-PAC OT "6 Clicks" Daily Activity     Outcome Measure   Help from another person eating meals?: A Lot Help from another person taking care of personal grooming?: A Lot Help from another person toileting, which includes using toliet, bedpan, or urinal?: Total Help from another person bathing (including washing, rinsing, drying)?: Total Help from another person to put on and taking off regular upper body clothing?: A Lot Help from another person to put on and taking off regular lower body clothing?: Total 6 Click Score: 9    End of Session Equipment Utilized During Treatment: Oxygen  OT Visit Diagnosis: Muscle weakness (generalized) (M62.81);Apraxia (R48.2);Cognitive communication deficit (R41.841);Hemiplegia and hemiparesis Symptoms and signs involving cognitive functions: Other Nontraumatic ICH Hemiplegia - Right/Left: Right Hemiplegia -  caused by: Nontraumatic intracerebral hemorrhage   Activity Tolerance Patient tolerated treatment well   Patient Left in bed;with family/visitor present   Nurse Communication Mobility status        Time: 1100-1134 OT Time Calculation (min): 34 min  Charges: OT General Charges $OT Visit: 1 Visit OT Treatments $Self Care/Home Management : 23-37 mins  Barry Brunner, OT Acute Rehabilitation Services Pager [phone] Office [phone]   [name] 04/13/2021, 11:44 AM

## 2021-04-13 NOTE — Progress Notes (Signed)
Nutrition Follow-up  DOCUMENTATION CODES:  Not applicable  INTERVENTION:  -Continue to encourage PO intake & assist as needed -Continue Ensure Enlive po TID -Continue MVI with minerals daily  Continue nocturnal TF via PEG: -Jevity 1.5 @ 78ml/hr for 10 hours from 2000-0600 ( total) -30ml PROSource TF daily Provides 1165 kcals, 58 grams protein, free water -- meets ~73% minimum estimated calorie and protein needs -Free water flushes per MD, currently Q4H (provides total free water w/ TF and flushes)  NUTRITION DIAGNOSIS:  Inadequate oral intake related to inability to eat as evidenced by NPO status. 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  Subsequently on 2/10, pt developed obtundation and respiratory arrest leading to intubation. Pt is now s/p trach/PEG placement. Hospital course complicated by Serratia pneumonia, ESBL Enterobacter UTI and pre-orbital cellulitis.  2/10 s/p EVD placement and intubation for respiratory arrest 2/13 EVD removed 2/17 palliative care consult; family requesting tx to [facility] 2/24 s/p trach and PEG 3/1 tx to [facility] 3/11 trach changed to shiley #6 cuffless 4/08 pt w/ sepsis 2/2 UTI 4/18 28% ATC/5L 5/2 CIR reevaluated pt and determined she has not made enough improvement for admit; recommended SNF 5/8 trach changed to shiley #4 cuffless 5/23 CCM evaluated and determined pt stilll unable to decannulate due to variable mental status 6/3 trach exchanged, still shiley #4  cuffless 6/30 s/p R knee aspiration and injection 7/3 trach exchanged, still shiley #4 cuffless 7/8 diet advanced to dysphagia 1 with thin liquids 7/17 trach exchanged, still shiley #4 cuffless 7/21 trach replaced after pt self-decannulated  8/01 tolerating PMV, minimal secretions 8/02 PEG replaced after pt removed 8/14 trach exchanged, still shiley #4 cuffless 8/23 trach reinserted (still shiley #4 cuffless) after pt decannulated herself  Pt underwent monthly trach exchange yesterday, tolerated well per RT. Per CCM, no plans for decannulation in the future, trach dependent. Mit removed today with understanding that pt may self-decannulate.  Intake of both meals and supplements remain variable per RN. Last 8 meal completions documented as 0-50% (33% average meal intake). Ensure intake remains around 30-50% completion of each bottle. Recommend continue current nutrition plan of care at this time.   Admission weight: 61.2 kg Current weight: 68kg  Medications: Ensure Enlive/Plus TID, mvi with minerals, protonix Labs reviewed.  Current TF: Jevity 1.5 @ 93ml/hr administered over 10 hours (2000-0600) to provide TF per night, 20ml Prosource TF daily, free water Q4H  Diet Order:   Diet Order             DIET - DYS 1 Room service appropriate? No; Fluid consistency: Thin  Diet effective now                  EDUCATION NEEDS:  No education needs have been identified at this time  Skin:  Skin Assessment: Reviewed RN Assessment  Last BM:  9/26  Height:  Ht Readings from Last 1 Encounters:  10/18/20 5\' 5"  (1.651 m)   Weight:  Wt Readings from Last 1 Encounters:  04/11/21 68 kg   Ideal  Body Weight:  56.8 kg  BMI:  Body mass index is 24.95 kg/m.  Estimated Nutritional Needs:  Kcal:  1600-1800 Protein:  80-90 grams Fluid:  >1.6L/d   Eugene Gavia, MS, RD, LDN (she/her/hers) RD pager number and weekend/on-call pager number located in Amion.

## 2021-04-13 NOTE — Progress Notes (Addendum)
NAME:  Charlene [name], MRN:  [id number], DOB:  [birth year], LOS: 229 ADMISSION DATE:  08/26/2020, CONSULTATION DATE:  2/10 REFERRING MD:  Roda Shutters, CHIEF COMPLAINT:  headache   History of Present Illness:  55 year old female admitted with hypertensive R thalamic/basal ganglia ICH. Resultant obstructive hydrocephalus prompting EVD placement 2/10. Subsequently, 2/10 she had acute mental status change to obtundation and respiratory arrest resulting in intubation. Trach and Peg 2/24. Trach change to #6 cuffless on 3/11.   Hospital course complicated by Serratia pneumonia, ESBL UTI, preorbital cellulitis  Pertinent  Medical History  Hypertension  Significant Hospital Events: Including procedures, antibiotic start and stop dates in addition to other pertinent events   2/09 Admit for ICH 2/10 EVD placed, later intubated due to obtundation and respiratory arrest 2/11 MRI Brain, hypertonic saline d/c'd, EVD not working.  Husband did not consent to PICC  2/13 EVD removed 2/17 GOC discussion with family, PMT 2/24 tracheostomy placed by Dr. Denese Killings, PEG placed by Dr. Sheliah Hatch 3/11 trach changed to Shiley 6 uncuffed 3/21 sputum culture with Serratia resistant to Ancef 4/18 28% ATC / 5L  5/08 downsized trach to 4 7/11 No significant secretions / no issues with trach 7/21 Self de-cannulated, trach replaced 7/25 ATC 21%/5L. Using PSV, some speech per SLP, eating 8/1 tolerating PMV, minimal secretions 8/22 Per SLP-- delayed cough, leaking fluids from her mouth, still needs pureed diet. Speech unintelligible, but able to attempt to say names with SLP. Has still had episodes where she is too sleepy to participate with therapies. Family at bedside today-- she thinks Charlene [name] is pretty awake today. 8/23 Pt pulled trach out, reinserted w/o difficulty 9/13 Long discussion with husband Charlene [name] at bedside.  Discussed risks and benefits of decannulation.  Explained that she remains aspiration risk and even if  decannulation were successful in the short-term, there would be some long-term risk of aspiration pneumonia and recurrent episode of mechanical ventilation.  After our discussion, he preferred that we not attempt decannulation. Also discussed our plan for accidental decannulation, he prefers that we replace tracheostomy in this scenario  Interim History / Subjective:   No acute issues. SLP working with Ms. Burciaga yesterday. Monthly trach exchange completed yesterday uneventfully.   Suctioned twice overnight without significant secretions  Objective   Blood pressure 103/66, pulse 91, temperature 99 F (37.2 C), temperature source Oral, resp. rate 16, height 5\' 5"  (1.651 m), weight 68 kg, last menstrual period 03/03/2014, SpO2 97 %, unknown if currently breastfeeding.    FiO2 (%):  [21 %] 21 %  No intake or output data in the 24 hours ending 04/13/21 0633  Filed Weights   04/09/21 0500 04/10/21 0500 04/11/21 0500  Weight: 67.4 kg 67.4 kg 68 kg     Physical Exam: General: Chronically ill appearing, NAD HENT: NCAT, MMM Neck: #4 Cuffless trach in place, c/d/i Eyes: doesn't open eyes for me to voice but appears volitional Respiratory: Clear to auscultation bilaterally.  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 Mental status has improved.  -new 4 shiley cuffless trach placed 9/26  -No plans for  decannulation in the future, trach dependent. -Continue routine trach care -PT/OT   Pulmonary will follow weekly  [facility]   See Amion for personal pager For hours between 7 PM to 7 AM, please call Elink for urgent questions

## 2021-04-14 DIAGNOSIS — H6593 Unspecified nonsuppurative otitis media, bilateral: Secondary | ICD-10-CM

## 2021-04-14 HISTORY — DX: Unspecified nonsuppurative otitis media, bilateral: H65.93

## 2021-04-14 LAB — GLUCOSE, CAPILLARY
Glucose-Capillary: 152 mg/dL — ABNORMAL HIGH (ref 70–99)
Glucose-Capillary: 149 mg/dL — ABNORMAL HIGH (ref 70–99)
Glucose-Capillary: 109 mg/dL — ABNORMAL HIGH (ref 70–99)

## 2021-04-14 MED ORDER — CHLORHEXIDINE GLUCONATE 0.12 % MT SOLN
OROMUCOSAL | Status: AC
  Filled 2021-04-14: qty 15

## 2021-04-14 MED ORDER — KETOROLAC TROMETHAMINE 30 MG/ML IJ SOLN
30.0000 mg | Freq: Once | INTRAMUSCULAR | Status: AC
  Administered 2021-04-14: 30 mg via INTRAVENOUS
  Filled 2021-04-14: qty 1

## 2021-04-14 MED ORDER — IBUPROFEN 100 MG/5ML PO SUSP
400.0000 mg | Freq: Four times a day (QID) | ORAL | Status: DC
  Administered 2021-04-14 – 2021-04-16 (×10): 400 mg
  Filled 2021-04-14 (×13): qty 20

## 2021-04-14 NOTE — Progress Notes (Addendum)
CSW obtained specific trach information from patient's bedside with assistance from RN.  CSW forwarded information to [place] at [facility] in [place].  Patient will discharge to [facility] in [place] via [place] on Friday 04/16/2021. TOC leadership will submit request to [facility] for pick up at 9:30am on Friday morning.   CSW reviewed [place] [employer] - patient still does not have active Medicaid.  CSW spoke [place] at [facility] who states the patient is not active with the agency.   CSW spoke with Marcelle Smiling at [place] [facility] who states there has not been a decision made regarding the patient's Medicaid application.  CSW attempted to reach patient's husband [name] - no answer, a voicemail was left with discharge details and to request a return call.  Edwin Dada, MSW, LCSW Transitions of Care  Clinical Social Worker II [phone]

## 2021-04-14 NOTE — Progress Notes (Signed)
[facility] PROGRESS NOTE  Charlene [name] QJJ:[id number] DOB: 1966/03/08 DOA: 08/26/2020 PCP: Patient, [name] (Inactive)    October 08, 2020     10/16/2020                        10/16/2020     10/18/2020                        10/20/2020      12/30/2020                   12/30/2020     01/04/2021                   02/24/2021 Viv really needs a teddy bear!   Status: Remains inpatient appropriate because:Altered mental status and Unsafe d/c plan: Requirement to discharge to trach capable SNF   Dispo: The patient is from: Home              Anticipated d/c is to: SNF trach capable-Unable to decannulate 2/2 and lack of appropriate voluntary and involuntary cough response              Patient currently is medically stable to d/c.   Difficult to place patient Yes         Barriers to DC: Recently has had mitten off/mitten discontinue but so far has tolerated x24 hours   Level of care: Telemetry Medical  Code Status: Full Family Communication: Extensive conversation held with husband [name] by phone on 9/27 DVT prophylaxis: Chronically bedbound so Lovenox discontinued   Vaccination status: Unvaccinated-husband has agreed to [employer] vaccine but only if required by accepting facility  Micro Data:  2/9 COVID + Influenza negative 2/10 MRSA PCR negative 2/12 BCx 2 > no growth  2/19 BC 1/2 +Stap EPI, 1/2 no growth  10/07/2020 sputum positive for Serratia marcescens 4/8 Enterobacter cloaca + urine culture MDR sensitive only to imipenem and gentamicin 5/20 urine positive for Klebsiella resistant to ampicillin and Macrodantin 6/2 urine positive for Klebsiella resistant to ampicillin, cefazolin and Macrodantin 7/4 urine positive for ESBL Klebsiella resistant to ampicillin, cefazolin and Macrodantin 7/28 urine positive for Enterobacter resistant to cefazolin, cefepime and Septra 9/2 urine positive for pansensitive Pseudomonas     Antimicrobials:  Cefepime 2/12 > 2/15 Vanc 2/12 >  2/15 10/08/2020 ciprofloxacin>>3/30 Unasyn 3/30 >> 4/02 Rocephin 4/11 > 4/12 Meropenem 4/12>4/17 Meropenem 5/20 through 5/22 Ceftriaxone 5/22 through 5/26 Meropenem 6/3 Ceftriaxone 6/4 through 6/10 Septra 7/4 through 7/7 Ceftriaxone 7/29 was 1 dose followed by 1 dose of fosfomycin Cipro 9/2 >>   HPI: 55 yo F with 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  Subsequently on 2/10, she developed obtundation and respiratory arrest leading to intubation.  She has remained persistently obtunded, now has a trach and a PEG.  Hospital course complicated by persistent fever, Serratia pneumonia and now pre-orbital cellulitis  As of 3/25 with adjustments in hypertonicity medications patient began to improve regarding hypertonicity.  Eventually started on provigil with improvements in alertness.  Evaluated by rehab physician who recommended increasing provigil dose further.  Subsequently patient has become more alert.  Most days she is able to track and with encouragement able to follow simple commands especially since partial obstruction glasses placed.  See progress note dictated 4/27 for expanded details.   Significant events: 2/09 Admit for ICH 2/10 EVD placed, later intubated  due to obtundation and respiratory arrest 2/11 MRI Brain, hypertonic saline d/c'd, EVD not working.  Husband did not consent to PICC  2/13 EVD removed 2/17 [place] discussion with family, PMT 2/24 Bedside trach and PEG 3/1 Transferred to [facility]  Subjective: Awake.  Reporting right ear pain in context of known middle ear otitis-able to relate that after given Toradol ear pain better  Objective: Vitals:   04/14/21 0346 04/14/21 0400  BP: 100/64 111/85  Pulse: 76 76  Resp: 18 18  Temp:  97.7 F (36.5 C)  SpO2: 98% 98%   Filed Weights   04/09/21 0500 04/10/21 0500 04/11/21 0500  Weight: 67.4 kg 67.4 kg 68 kg    Exam:       Gen: Awake  and alert  Respiratory: #4.0 cuffless trach, trach collar with FiO2 21% at 5L; lung sounds clear, no increased work of breathing, no tracheal secretions. Cardiovascular: Heart sounds S1-S2, mildly hypertensive today in context of bilateral ear pain, no peripheral edema, regular pulse Abdomen:  PEG /nocturnal tube feedings. LBM 9/28, nondistended, normoactive bowel sounds.   Neurological: CN 2 -12 intact. Purposeful movement of right upper extremity--strength 4/5.   RLE strength 1-2/5 w/ purposeful movement.  Persistent left-side neglect.  Psychiatric: Awake and alert initially.  When I went back to her room she had just finished eating and per her routine she was very drowsy  Assessment/Plan: Acute problems: Multiple acute strokes  Right Thalamus ICH with IVH  Obstructive Hydrocephalus/left-sided neglect /Non TBI related hypertonicity -Continue HS baclofen -Continues with LUE neglect requiring soft splint.  Moves both LEs right much greater than left -Husband would like to participate with therapies.  He is available every Tuesday at 11 AM.  Message sent to therapy team  Bilateral ear pain/acute vs chronic middle ear effusion -History of chronic sinusitis which is likely the etiology of current issue -Continue Flonase to assist with opening up eustachian tube -Continue Cipro/steroid otic 2 drops BID for 7 days -Having more ear pain today therefore has been given IV Toradol x1 dose to be followed by scheduled ibuprofen; continue previously ordered Ultram prn  Tracheostomy dependent -PCCM/trach team following closely regarding candidacy for decannulation. -Husband and family agree risk outweighs benefit to decannulate -Mitten discontinued and has been off for at least 24 hours  Microcytic anemia with known previous iron deficiency Continues with low MCV and hemoglobin 11-repeat anemia panel in September normal  Fever 2/2 Pseudomonas UTI/history of ESBL Klebsiella UTI Completed 7 days of  Cipro Continue contact isolation  Constipation -Continue prn MiraLAX -7 days between last bowel movement as of 9/19 so was given MOM x 1 and subsequently had a bowel movement on 9/20.  Today 9/21 was on bedpan upon my entry to the room.  Hypertensive emergency/Malignant HTN -Continue Norvasc, beta-blocker and hydralazine.  Blood pressure well controlled  Dysphagia -Continue dysphagia 1 with thin liquids-Inconsistent oral intake therefore -continue nocturnal tube feedings  History of urinary retention -Continue Cardura  Depression -Discussed current symptoms with patient's husband noting that it is perfectly understandable that patient would be sad and depressed -Explained how SSRIs work and this would be my recommendation for a medication.  Both he and Viv historically do not like to take medications he will think about it and discuss with the family given her symptoms seem to be somewhat severe and be influencing her ability to participate with therapies and her desire to eat  Right upper extremity thrombophlebitis - [name] -> age indeterminate SVT of right cephalic  vein-RUE ultrasound pending-as a precaution attending physician on 9/20 opted to initiate pharmacologic VTE prophylaxis after clearing with NS Xu -Duplex 9/21 w/o SVT or DVT  Neck Contracture/torticollis -- Resolved  Recurrent right knee pain/known ligamentous injury and chronic osteoarthritis -s/p injection of Marcaine and Depomedrol 6/30 -9/19 given blackbox warnings regarding stroke and MI dc'd preadmission Celebrex  Chronic pansinusitis/recurrent fevers 2/2 aspiration PNA;History of Serratia Pneumonia  Hospital Acquired Pneumonia:  -Completed several courses of antibiotics -Follow-up imaging including sinus CT consistent with chronic but stable and resolving sinusitis  HLD -Continue Lipitor  Data Reviewed: CBG: Recent Labs  Lab 04/11/21 1708 04/12/21 0622 04/13/21 1155 04/13/21 1554 04/13/21 2020  GLUCAP 98  146* 96 98 91       Scheduled Meds:  acetaminophen  650 mg Per Tube Q6H   amLODipine  10 mg Per Tube Daily   atorvastatin  40 mg Per Tube Daily   baclofen  5 mg Per Tube QHS   carvedilol  25 mg Per Tube BID WC   chlorhexidine gluconate (MEDLINE KIT)  15 mL Mouth Rinse BID   Chlorhexidine Gluconate Cloth  6 each Topical Daily   ciprofloxacin-fluocinolone PF  0.25 mL Both EARS BID   diclofenac Sodium  4 g Topical QID   doxazosin  2 mg Per Tube Daily   enoxaparin (LOVENOX) injection  40 mg Subcutaneous Q24H   feeding supplement  237 mL Per Tube TID BM   feeding supplement (JEVITY 1.5 CAL/FIBER)  750 mL Per Tube Q24H   feeding supplement (PROSource TF)  45 mL Per Tube Daily   fluticasone  2 spray Each Nare Daily   free water  200 mL Per Tube Q4H   hydrALAZINE  37.5 mg Per Tube Q8H   mouth rinse  15 mL Mouth Rinse 5 X Daily   modafinil  200 mg Per Tube Daily   multivitamin with minerals  1 tablet Per Tube Daily   pantoprazole sodium  40 mg Per Tube Daily   Continuous Infusions:  sodium chloride Stopped (10/26/20 1445)    Principal Problem:   ICH (intracerebral hemorrhage) ([place]) Active Problems:   Acute hypoxemic respiratory failure (HCC)   Tracheostomy dependent (HCC)   Palliative care by specialist   Muscle hypertonicity   Oropharyngeal dysphagia   Poor prognosis   Aspiration pneumonia of right lower lobe (HCC)   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   Acute cystitis without hematuria   Ileus, unspecified (HCC)   Constipation   Knee pain, right   Nonspecific abnormal electrocardiogram (ECG) (EKG)   Pseudomonas urinary tract infection   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  Palliative care.   PCCM continues to follow.  No decannulation.   Procedures: Tracheostomy PEG  tube EEG Echocardiogram  Antibiotics: Vancomycin 2/12-2/15 Maxipime 2/12-2/15 Ancef 2/19-2/27 Vancomycin 2/20 x 1 dose Maxipime 2/27- 3/5 Vancomycin 3/1-3/3 Ancef 3/6-3/14 Cipro 3/20 4-3/30 Unasyn 3/30-4/4 Rocephin 4/11-4/12 Meropenem 4/12-4/17 Cipro 9/2 >> 9/8   Time spent: 15 minutes    Erin Hearing ANP  [facility] 7 am - 330 pm/M-F for direct patient care and secure chat Please refer to [place] for contact info 230  Days

## 2021-04-14 NOTE — Plan of Care (Signed)
  Problem: Coping: Goal: Will identify appropriate support needs Outcome: Progressing   Problem: Health Behavior/Discharge Planning: Goal: Ability to manage health-related needs will improve Outcome: Progressing   Problem: Self-Care: Goal: Verbalization of feelings and concerns over difficulty with self-care will improve Outcome: Progressing Goal: Ability to communicate needs accurately will improve Outcome: Progressing   Problem: Nutrition: Goal: Risk of aspiration will decrease Outcome: Progressing   Problem: Intracerebral Hemorrhage Tissue Perfusion: Goal: Complications of Intracerebral Hemorrhage will be minimized Outcome: Progressing   Problem: Education: Goal: Knowledge of General Education information will improve Description: Including pain rating scale, medication(s)/side effects and non-pharmacologic comfort measures Outcome: Progressing   Problem: Health Behavior/Discharge Planning: Goal: Ability to manage health-related needs will improve Outcome: Progressing   Problem: Clinical Measurements: Goal: Ability to maintain clinical measurements within normal limits will improve Outcome: Progressing Goal: Will remain free from infection Outcome: Progressing Goal: Diagnostic test results will improve Outcome: Progressing Goal: Respiratory complications will improve Outcome: Progressing Goal: Cardiovascular complication will be avoided Outcome: Progressing   Problem: Coping: Goal: Level of anxiety will decrease Outcome: Progressing   Problem: Elimination: Goal: Will not experience complications related to bowel motility Outcome: Progressing Goal: Will not experience complications related to urinary retention Outcome: Progressing   Problem: Pain Managment: Goal: General experience of comfort will improve Outcome: Progressing   Problem: Skin Integrity: Goal: Risk for impaired skin integrity will decrease Outcome: Progressing   Problem: Intracerebral  Hemorrhage Tissue Perfusion: Goal: Complications of Intracerebral Hemorrhage will be minimized Outcome: Progressing   Problem: Ischemic Stroke/TIA Tissue Perfusion: Goal: Complications of ischemic stroke/TIA will be minimized Outcome: Progressing   

## 2021-04-15 DIAGNOSIS — H65191 Other acute nonsuppurative otitis media, right ear: Secondary | ICD-10-CM | POA: Diagnosis not present

## 2021-04-15 LAB — CBC WITH DIFFERENTIAL/PLATELET
Abs Immature Granulocytes: 0 10*3/uL (ref 0.00–0.07)
Basophils Absolute: 0 10*3/uL (ref 0.0–0.1)
Basophils Relative: 1 %
Eosinophils Absolute: 0.1 10*3/uL (ref 0.0–0.5)
Eosinophils Relative: 2 %
HCT: 33 % — ABNORMAL LOW (ref 36.0–46.0)
Hemoglobin: 10.6 g/dL — ABNORMAL LOW (ref 12.0–15.0)
Immature Granulocytes: 0 %
Lymphocytes Relative: 48 %
Lymphs Abs: 1.8 10*3/uL (ref 0.7–4.0)
MCH: 25.2 pg — ABNORMAL LOW (ref 26.0–34.0)
MCHC: 32.1 g/dL (ref 30.0–36.0)
MCV: 78.4 fL — ABNORMAL LOW (ref 80.0–100.0)
Monocytes Absolute: 0.3 10*3/uL (ref 0.1–1.0)
Monocytes Relative: 9 %
Neutro Abs: 1.5 10*3/uL — ABNORMAL LOW (ref 1.7–7.7)
Neutrophils Relative %: 40 %
Platelets: 223 10*3/uL (ref 150–400)
RBC: 4.21 MIL/uL (ref 3.87–5.11)
RDW: 14.6 % (ref 11.5–15.5)
WBC: 3.7 10*3/uL — ABNORMAL LOW (ref 4.0–10.5)
nRBC: 0 % (ref 0.0–0.2)

## 2021-04-15 LAB — COMPREHENSIVE METABOLIC PANEL
ALT: 60 U/L — ABNORMAL HIGH (ref 0–44)
AST: 27 U/L (ref 15–41)
Albumin: 3.6 g/dL (ref 3.5–5.0)
Alkaline Phosphatase: 77 U/L (ref 38–126)
Anion gap: 10 (ref 5–15)
BUN: 15 mg/dL (ref 6–20)
CO2: 29 mmol/L (ref 22–32)
Calcium: 9.8 mg/dL (ref 8.9–10.3)
Chloride: 102 mmol/L (ref 98–111)
Creatinine, Ser: 0.65 mg/dL (ref 0.44–1.00)
GFR, Estimated: >60 mL/min (ref >60)
Glucose, Bld: 103 mg/dL — ABNORMAL HIGH (ref 70–99)
Potassium: 3.6 mmol/L (ref 3.5–5.1)
Sodium: 141 mmol/L (ref 135–145)
Total Bilirubin: 0.7 mg/dL (ref 0.3–1.2)
Total Protein: 7.1 g/dL (ref 6.5–8.1)

## 2021-04-15 LAB — GLUCOSE, CAPILLARY
Glucose-Capillary: 105 mg/dL — ABNORMAL HIGH (ref 70–99)
Glucose-Capillary: 90 mg/dL (ref 70–99)

## 2021-04-15 MED ORDER — PANTOPRAZOLE SODIUM 40 MG PO PACK: 40.0000 mg | PACK | Freq: Every day | ORAL | Status: DC

## 2021-04-15 MED ORDER — OXYMETAZOLINE HCL 0.05 % NA SOLN
1.0000 | Freq: Two times a day (BID) | NASAL | Status: DC
  Administered 2021-04-15 – 2021-04-16 (×3): 1 via NASAL
  Filled 2021-04-15: qty 30

## 2021-04-15 MED ORDER — ACETAMINOPHEN 325 MG PO TABS: 650.0000 mg | ORAL_TABLET | Freq: Four times a day (QID) | ORAL | Status: DC

## 2021-04-15 MED ORDER — POLYETHYLENE GLYCOL 3350 17 G PO PACK: 17.0000 g | PACK | Freq: Every day | ORAL | 0 refills | Status: DC | PRN

## 2021-04-15 MED ORDER — BENZOCAINE 10 % MT GEL: Freq: Four times a day (QID) | OROMUCOSAL | 0 refills | Status: DC | PRN

## 2021-04-15 MED ORDER — PROSOURCE TF PO LIQD: 45.0000 mL | Freq: Every day | ORAL | Status: DC

## 2021-04-15 MED ORDER — HYDRALAZINE HCL 25 MG PO TABS: 37.5000 mg | ORAL_TABLET | Freq: Three times a day (TID) | ORAL | Status: DC

## 2021-04-15 MED ORDER — CARVEDILOL 25 MG PO TABS: 25.0000 mg | ORAL_TABLET | Freq: Two times a day (BID) | ORAL | Status: DC

## 2021-04-15 MED ORDER — AMOXICILLIN-POT CLAVULANATE 250-62.5 MG/5ML PO SUSR: 500.0000 mg | Freq: Three times a day (TID) | ORAL | 0 refills | Status: AC

## 2021-04-15 MED ORDER — ENSURE ENLIVE PO LIQD: 237.0000 mL | Freq: Three times a day (TID) | ORAL | 12 refills | Status: DC

## 2021-04-15 MED ORDER — AMLODIPINE BESYLATE 10 MG PO TABS: 10.0000 mg | ORAL_TABLET | Freq: Every day | ORAL | Status: DC

## 2021-04-15 MED ORDER — BACLOFEN 1 MG/ML ORAL SUSPENSION: 5.0000 mg | Freq: Every day | ORAL | Status: DC

## 2021-04-15 MED ORDER — CIPROFLOXACIN-FLUOCINOLONE PF 0.3-0.025 % OT SOLN: 0.2500 mL | Freq: Two times a day (BID) | OTIC | 0 refills | Status: AC

## 2021-04-15 MED ORDER — ATORVASTATIN CALCIUM 40 MG PO TABS: 40.0000 mg | ORAL_TABLET | Freq: Every day | ORAL | Status: DC

## 2021-04-15 MED ORDER — DOXAZOSIN MESYLATE 2 MG PO TABS: 2.0000 mg | ORAL_TABLET | Freq: Every day | ORAL | Status: DC

## 2021-04-15 MED ORDER — DICLOFENAC SODIUM 1 % EX GEL: 4.0000 g | Freq: Four times a day (QID) | CUTANEOUS | Status: AC

## 2021-04-15 MED ORDER — TRAMADOL HCL 50 MG PO TABS: 25.0000 mg | ORAL_TABLET | Freq: Two times a day (BID) | ORAL | Status: DC | PRN

## 2021-04-15 MED ORDER — MAGIC MOUTHWASH W/LIDOCAINE: 10.0000 mL | Freq: Four times a day (QID) | ORAL | 0 refills | Status: DC | PRN

## 2021-04-15 MED ORDER — ENOXAPARIN SODIUM 40 MG/0.4ML IJ SOSY: 40.0000 mg | PREFILLED_SYRINGE | INTRAMUSCULAR | Status: DC

## 2021-04-15 MED ORDER — AMOXICILLIN-POT CLAVULANATE 250-62.5 MG/5ML PO SUSR
500.0000 mg | Freq: Three times a day (TID) | ORAL | Status: DC
  Administered 2021-04-15 – 2021-04-16 (×5): 500 mg
  Filled 2021-04-15 (×6): qty 10

## 2021-04-15 MED ORDER — MODAFINIL 200 MG PO TABS: 200.0000 mg | ORAL_TABLET | Freq: Every day | ORAL | Status: DC

## 2021-04-15 MED ORDER — JEVITY 1.5 CAL/FIBER PO LIQD: 750.0000 mL | ORAL | Status: DC

## 2021-04-15 MED ORDER — FLUTICASONE PROPIONATE 50 MCG/ACT NA SUSP: 2.0000 | Freq: Every day | NASAL | 2 refills | Status: DC

## 2021-04-15 MED ORDER — FREE WATER
200.0000 mL | Status: DC
Start: 2021-04-15 — End: 2022-05-16

## 2021-04-15 MED ORDER — OXYMETAZOLINE HCL 0.05 % NA SOLN: 1.0000 | Freq: Two times a day (BID) | NASAL | 0 refills | Status: AC

## 2021-04-15 MED ORDER — IBUPROFEN 100 MG/5ML PO SUSP: 400.0000 mg | Freq: Four times a day (QID) | ORAL | 0 refills | Status: AC

## 2021-04-15 MED ORDER — ADULT MULTIVITAMIN W/MINERALS CH: 1.0000 | ORAL_TABLET | Freq: Every day | ORAL | Status: DC

## 2021-04-15 NOTE — Plan of Care (Signed)
  Problem: Coping: Goal: Will identify appropriate support needs Outcome: Progressing   Problem: Health Behavior/Discharge Planning: Goal: Ability to manage health-related needs will improve Outcome: Progressing   Problem: Self-Care: Goal: Verbalization of feelings and concerns over difficulty with self-care will improve Outcome: Progressing Goal: Ability to communicate needs accurately will improve Outcome: Progressing   Problem: Nutrition: Goal: Risk of aspiration will decrease Outcome: Progressing   Problem: Intracerebral Hemorrhage Tissue Perfusion: Goal: Complications of Intracerebral Hemorrhage will be minimized Outcome: Progressing   Problem: Education: Goal: Knowledge of General Education information will improve Description: Including pain rating scale, medication(s)/side effects and non-pharmacologic comfort measures Outcome: Progressing   Problem: Health Behavior/Discharge Planning: Goal: Ability to manage health-related needs will improve Outcome: Progressing   Problem: Clinical Measurements: Goal: Ability to maintain clinical measurements within normal limits will improve Outcome: Progressing Goal: Will remain free from infection Outcome: Progressing Goal: Diagnostic test results will improve Outcome: Progressing Goal: Respiratory complications will improve Outcome: Progressing Goal: Cardiovascular complication will be avoided Outcome: Progressing   Problem: Coping: Goal: Level of anxiety will decrease Outcome: Progressing   Problem: Elimination: Goal: Will not experience complications related to bowel motility Outcome: Progressing Goal: Will not experience complications related to urinary retention Outcome: Progressing   Problem: Pain Managment: Goal: General experience of comfort will improve Outcome: Progressing   Problem: Skin Integrity: Goal: Risk for impaired skin integrity will decrease Outcome: Progressing   Problem: Intracerebral  Hemorrhage Tissue Perfusion: Goal: Complications of Intracerebral Hemorrhage will be minimized Outcome: Progressing   Problem: Ischemic Stroke/TIA Tissue Perfusion: Goal: Complications of ischemic stroke/TIA will be minimized Outcome: Progressing   

## 2021-04-15 NOTE — Discharge Summary (Addendum)
Physician Discharge Summary  Charlene [name] NWG:[id number] DOB: 1966-01-19 DOA: 08/26/2020  PCP: Patient, [name] (Inactive)  Admit date: 08/26/2020 Discharge date: 04/16/2021  Time spent: 45 minutes  Recommendations for Outpatient Follow-up:  Continue nocturnal tube feedings per PEG Continue OT and PT. Patient requires Michiel Sites lift to get out of bed to chair.  If head support wheelchair available patient does not like to be rolled outside  Continue SLP services both for linguistics as well as swallowing Continue passive range of motion to all extremities focusing on LUE and LLE in context of hemiparesis.  Continue extremity splints She has recently been diagnosed with mild right otitis media and has been started on Augmentin for a total of 6 more days therapy.  She is also on Cipro otic to both ears for a total of 3 more days Patient requires trach care daily and as needed.  She rarely requires suctioning of the trach She is on a dysphagia 1/pured diet and is a total feed.  She prefers blueberry yogurt.  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   Middle ear effusion, bilateral   Acute otitis media with effusion of right ear   History of urinary retention-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   Osteoarthritis of right knee   HLD (hyperlipidemia)   Superficial Thrombophlebitis of arm, right (RESOLVED)   Severe Dental disease  CODE STATUS: Full code  Discharge Condition:  Stable  Diet recommendation: Jevity tube feedings from 8 PM to 8 AM; she is also on a pured diet with thin liquids but must be fed-head of bed should be up a minimum of 40 degrees with oral intake  Filed Weights   04/09/21 0500 04/10/21 0500 04/11/21 0500  Weight: 67.4 kg 67.4 kg 68 kg    History of present illness:  55 yo F with 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  Subsequently on 2/10, she developed obtundation and respiratory arrest leading to intubation.  She has remained persistently obtunded, now has a trach and a PEG.  Hospital course complicated by persistent fever, Serratia pneumonia and now pre-orbital cellulitis   As of 3/25 with adjustments in hypertonicity medications patient began to improve regarding hypertonicity.  Eventually started on provigil with improvements in alertness.  Evaluated by rehab physician who recommended increasing provigil dose further.  Subsequently patient has become more alert.  Over the past several months patient has become alert enough to the point where she can attempt to phonate around her trach and at least mouth words, she has been able to write responses on the notepad, she can eat a pured diet when fed.  She would definitely benefit from aggressive interaction as well as OT and PT.  If she is able to tolerate out of bed to recliner chair it is recommended that she be placed in situations where she can interact with other people.  Her family live in [facility] [place] and have previously stated it  will be difficult for them to visit her regularly.  They are regular bedside care and interaction with the patient is likely why she has improved so much given her significant brain injury from massive stroke.  Hospital Course:  Multiple acute strokes  Right Thalamus ICH with IVH  Obstructive Hydrocephalus/left-sided neglect /Non TBI related  hypertonicity -Continue HS baclofen -Continues with LUE neglect requiring soft splint.  Moves both LEs right much greater than left -Husband would like to participate with therapies.  He is available every Tuesday at 11 AM.  Message sent to therapy team   Acute chronic bilateral otitis media/bilateral middle ear effusion -History of chronic sinusitis which is likely the etiology of current issue -Continue Flonase to assist with opening up eustachian tube -Continue Cipro/steroid otic 2 drops BID for 7 days -Continue scheduled ibuprofen; continue previously ordered Ultram prn -Add Afrin twice daily x5 days -Reexamined patient on 9/29 and noted right TM more reddened concerning for possible acute otitis media.  Have started Augmentin suspension for a total of 7 days   Severe dental disease -Family reports history of chronic dental pain with patient unable to obtain care secondary to financial limitation -Sinus CT in June 2022 revealed severe dental disease with periapical lucencies of both maxillary first molars and the on erupted right mandibular third molar.  There was also osteolysis of the maxillary cortex at right first molar root but there was no soft tissue abscess or fluid collection   Tracheostomy dependent -PCCM/trach team following closely regarding candidacy for decannulation. -Husband and family agree risk outweighs benefit to decannulate -Mitten discontinued and has been off for at least 24 hours   Microcytic anemia with known previous iron deficiency Continues with low MCV and hemoglobin 11-repeat anemia panel in September normal   Fever 2/2 Pseudomonas UTI/history of ESBL Klebsiella UTI Completed 7 days of Cipro recent Pseudomonas UTI Continue contact isolation   Constipation -Continue prn MiraLAX   Hypertensive emergency/Malignant HTN -Continue Norvasc, beta-blocker and hydralazine.  Blood pressure well controlled   Dysphagia -Continue dysphagia 1 with thin  liquids-Inconsistent oral intake therefore -continue nocturnal tube feedings   History of urinary retention -Continue Cardura   Depression -Stable. -Explained how SSRIs work and this would be my recommendation for a medication.  Both he and Charlene [name] historically do not like to take medications he will think about it and discuss with the family given her symptoms seem to be somewhat severe and be influencing her ability to participate with therapies and her desire to eat   Right upper extremity thrombophlebitis - [name] -> age indeterminate SVT of right cephalic vein-RUE ultrasound pending-as a precaution attending physician on 9/20 opted to initiate pharmacologic VTE prophylaxis after clearing with NS Xu -Duplex 9/21 w/o SVT or DVT   Neck Contracture/torticollis -- Resolved   Recurrent right knee pain/known ligamentous injury and chronic osteoarthritis -s/p injection of Marcaine and Depomedrol 6/30 -9/19 given blackbox warnings regarding stroke and MI dc'd preadmission Celebrex   Chronic pansinusitis/recurrent fevers 2/2 aspiration PNA;History of Serratia Pneumonia  Hospital Acquired Pneumonia:  -Completed several courses of antibiotics -Follow-up imaging including sinus CT consistent with chronic but stable and resolving sinusitis   HLD -Continue Lipitor  Procedures: Tracheostomy PEG tube EEG Echocardiogram  Consultations: Neurology  Neurosurgery.  Palliative care.   PCCM continues to follow.  No decannulation.  Antimicrobials:  Cefepime 2/12 > 2/15 Vanc 2/12 > 2/15 10/08/2020 ciprofloxacin>>3/30 Unasyn 3/30 >> 4/02 Rocephin 4/11 > 4/12 Meropenem 4/12>4/17 Meropenem 5/20 through 5/22 Ceftriaxone 5/22  through 5/26 Meropenem 6/3 Ceftriaxone 6/4 through 6/10 Septra 7/4 through 7/7 Ceftriaxone 7/29 was 1 dose followed by 1 dose of fosfomycin Cipro 9/2 >> 9/9 Augmentin 9/29 >>  Micro Data:  2/9 COVID + Influenza negative 2/10 MRSA PCR negative 2/12 BCx 2 > no growth   2/19 BC 1/2 +Stap EPI, 1/2 no growth  10/07/2020 sputum positive for Serratia marcescens 4/8 Enterobacter cloaca + urine culture MDR sensitive only to imipenem and gentamicin 5/20 urine positive for Klebsiella resistant to ampicillin and Macrodantin 6/2 urine positive for Klebsiella resistant to ampicillin, cefazolin and Macrodantin 7/4 urine positive for ESBL Klebsiella resistant to ampicillin, cefazolin and Macrodantin 7/28 urine positive for Enterobacter resistant to cefazolin, cefepime and Septra 9/2 urine positive for pansensitive Pseudomonas      Discharge Exam: Vitals:   04/16/21 0330 04/16/21 0416  BP: 135/82 (!) 142/78  Pulse: 77 81  Resp: 20 18  Temp:  97.9 F (36.6 C)  SpO2: 99% 100%   Gen: Awake and alert -husband and mother at bedside       ENT: Right TM more reddened and both TMs are now bulging and difficult to make out anatomy beneath TMs Respiratory: #4.0 cuffless trach, trach collar with FiO2 21% at 5L; lungs clear, no increased work of breathing and no excessive tracheal secretions Cardiovascular: Heart sounds S1-S2, mildly hypertensive today in context of bilateral ear pain, no peripheral edema, regular pulse Abdomen:  PEG /nocturnal tube feedings. LBM 9/28, nondistended, normoactive bowel sounds.   Neurological: CN 2 -12 intact. Purposeful movement of right upper extremity--strength 4/5.   RLE strength 1-2/5 w/ purposeful movement.  Persistent left-side neglect.  Psychiatric: Awake and alert -appears to be comfortable  Discharge Instructions Discharge Instructions     Diet general   Complete by: As directed    Dysphagia 1/pured diet with thin liquids-May use straws   Discharge instructions   Complete by: As directed    Continue PT/OT as well as speech therapy regarding linguistics as well as swallowing Recommend a dedicated trach team follow this patient to frequently reevaluate her for possible decannulation If necessary you may order therapeutic bed of  choice Family lives in [facility] [place] and will have difficulty frequently visiting Charlene [name].  Video phone calls would be very helpful in improving Charlene [name]'s outlook and affect.  She is currently very depressed about having to be placed so far away from her family especially from her husband. Attempt to place Fenna in a recliner chair and if possible roll her out of the room and if possible at the nurses station to improve socialization.   Increase activity slowly   Complete by: As directed    No wound care   Complete by: As directed       Allergies as of 04/16/2021   No Known Allergies      Medication List     STOP taking these medications    hydrochlorothiazide 25 MG tablet Commonly known as: HYDRODIURIL       TAKE these medications    acetaminophen 325 MG tablet Commonly known as: TYLENOL Place 2 tablets (650 mg total) into feeding tube every 6 (six) hours. What changed:  medication strength how much to take how to take this when to take this reasons to take this   amLODipine 10 MG tablet Commonly known as: NORVASC Place 1 tablet (10 mg total) into feeding tube daily.   amoxicillin-clavulanate 250-62.5 MG/5ML suspension Commonly known as: AUGMENTIN Place 10 mLs (500 mg total) into  feeding tube every 8 (eight) hours for 6 days.   atorvastatin 40 MG tablet Commonly known as: LIPITOR Place 1 tablet (40 mg total) into feeding tube daily.   baclofen 1 mg/mL Soln oral solution Commonly known as: OZOBAX Place 5 mLs (5 mg total) into feeding tube at bedtime.   benzocaine 10 % mucosal gel Commonly known as: ORAJEL Use as directed in the mouth or throat 4 (four) times daily as needed for mouth pain.   carvedilol 25 MG tablet Commonly known as: COREG Place 1 tablet (25 mg total) into feeding tube 2 (two) times daily with a meal. What changed:  medication strength how much to take how to take this   ciprofloxacin-fluocinolone PF 0.3-0.025 % Soln Commonly  known as: OTOVEL Place 0.25 mLs into both ears 2 (two) times daily for 3 days.   diclofenac Sodium 1 % Gel Commonly known as: VOLTAREN Apply 4 g topically 4 (four) times daily.   doxazosin 2 MG tablet Commonly known as: CARDURA Place 1 tablet (2 mg total) into feeding tube daily.   enoxaparin 40 MG/0.4ML injection Commonly known as: LOVENOX Inject 0.4 mLs (40 mg total) into the skin daily.   feeding supplement (PROSource TF) liquid Place 45 mLs into feeding tube daily.   feeding supplement (JEVITY 1.5 CAL/FIBER) Liqd Place 750 mLs into feeding tube daily.   feeding supplement Liqd Place 237 mLs into feeding tube 3 (three) times daily between meals.   fluticasone 50 MCG/ACT nasal spray Commonly known as: FLONASE Place 2 sprays into both nostrils daily.   free water Soln Place 200 mLs into feeding tube every 4 (four) hours.   hydrALAZINE 25 MG tablet Commonly known as: APRESOLINE Place 1.5 tablets (37.5 mg total) into feeding tube every 8 (eight) hours.   ibuprofen 100 MG/5ML suspension Commonly known as: ADVIL Place 20 mLs (400 mg total) into feeding tube every 6 (six) hours for 6 days.   magic mouthwash w/lidocaine Soln Take 10 mLs by mouth 4 (four) times daily as needed for mouth pain.   modafinil 200 MG tablet Commonly known as: PROVIGIL Place 1 tablet (200 mg total) into feeding tube daily.   multivitamin with minerals Tabs tablet Place 1 tablet into feeding tube daily.   oxymetazoline 0.05 % nasal spray Commonly known as: AFRIN Place 1 spray into both nostrils 2 (two) times daily for 4 days.   pantoprazole sodium 40 mg Commonly known as: PROTONIX Place 40 mg into feeding tube daily.   polyethylene glycol 17 g packet Commonly known as: MIRALAX / GLYCOLAX Place 17 g into feeding tube daily as needed for moderate constipation.   traMADol 50 MG tablet Commonly known as: ULTRAM Place 0.5 tablets (25 mg total) into feeding tube every 12 (twelve) hours as  needed for moderate pain.       No Known Allergies    The results of significant diagnostics from this hospitalization (including imaging, microbiology, ancillary and laboratory) are listed below for reference.    Significant Diagnostic Studies: CT HEAD WO CONTRAST ( )  Result Date: 04/08/2021 CLINICAL DATA:  Mental status change, persistent or worsening past strokes during this admission EXAM: CT HEAD WITHOUT CONTRAST TECHNIQUE: Contiguous axial images were obtained from the base of the skull through the vertex without intravenous contrast. COMPARISON:  09/23/2020 FINDINGS: Brain: No acute intracranial hemorrhage or mass effect. Encephalomalacia/gliosis involving the right corona radiata, basal ganglia, and thalamus. Right inferior temporal encephalomalacia. Additional chronic infarcts left corona radiata and thalamus. No acute appearing  loss of gray-white differentiation. No extra-axial collection. Vascular: No hyperdense vessel or unexpected calcification. Skull: No acute abnormality. Sinuses/Orbits: Minor mucosal thickening.  Orbits unremarkable. Other: Inferior left mastoid opacification. IMPRESSION: No acute intracranial abnormality. Multifocal encephalomalacia/gliosis. Electronically Signed   By: Guadlupe Spanish M.D.   On: 04/08/2021 13:20   DG CHEST PORT 1 VIEW  Result Date: 03/25/2021 CLINICAL DATA:  Altered mental status EXAM: PORTABLE CHEST 1 VIEW COMPARISON:  September 2nd FINDINGS: Stable positioning of tracheostomy tube. The cardiomediastinal silhouette is within normal limits. No pleural effusion. No pneumothorax. No mass or consolidation. No acute osseous abnormality. IMPRESSION: No acute abnormality in the chest. Electronically Signed   By: Olive Bass M.D.   On: 03/25/2021 11:31   DG CHEST PORT 1 VIEW  Result Date: 03/19/2021 CLINICAL DATA:  Fever EXAM: PORTABLE CHEST 1 VIEW COMPARISON:  03/03/2021 FINDINGS: Tracheostomy remains in good position. Lungs are clear without  infiltrate or effusion. Normal pulmonary vascularity. IMPRESSION: No active disease. Electronically Signed   By: Marlan Palau M.D.   On: 03/19/2021 10:19   VAS [name] UPPER EXTREMITY VENOUS DUPLEX  Result Date: 04/07/2021 UPPER VENOUS STUDY  Patient Name:  HOLDEN DRAUGHON  Date of Exam:   04/07/2021 Medical Rec #: [id number]         Accession #:    [id number] Date of Birth: 29-Jun-1966         Patient Gender: F Patient Age:   55 years Exam Location:  [facility] Procedure:      VAS [name] UPPER EXTREMITY VENOUS DUPLEX Referring Phys: Bess Harvest [facility] --------------------------------------------------------------------------------  Indications: Edema Comparison Study: 09-15-2020 Upper extremity venous was positive for SVT                   involving the right cephalic vein. Performing Technologist: Jean Rosenthal RDMS, RVT  Examination Guidelines: A complete evaluation includes B-mode imaging, spectral Doppler, color Doppler, and power Doppler as needed of all accessible portions of each vessel. Bilateral testing is considered an integral part of a complete examination. Limited examinations for reoccurring indications may be performed as noted.  Right Findings: +----------+------------+---------+-----------+----------+--------------+ RIGHT     CompressiblePhasicitySpontaneousProperties   Summary     +----------+------------+---------+-----------+----------+--------------+ IJV           Full       Yes       Yes                             +----------+------------+---------+-----------+----------+--------------+ Subclavian    Full       Yes       Yes                             +----------+------------+---------+-----------+----------+--------------+ Axillary      Full       Yes       Yes                             +----------+------------+---------+-----------+----------+--------------+ Brachial      Full                                                  +----------+------------+---------+-----------+----------+--------------+ Radial        Full                                                 +----------+------------+---------+-----------+----------+--------------+  Ulnar         Full                                                 +----------+------------+---------+-----------+----------+--------------+ Cephalic      Full                                                 +----------+------------+---------+-----------+----------+--------------+ Basilic                                             Not visualized +----------+------------+---------+-----------+----------+--------------+ High bifurcation of the radial and ulnar veins.  Left Findings: +----------+------------+---------+-----------+----------+-------+ LEFT      CompressiblePhasicitySpontaneousPropertiesSummary +----------+------------+---------+-----------+----------+-------+ Subclavian               Yes       Yes                      +----------+------------+---------+-----------+----------+-------+  Summary:  Right: No evidence of deep vein thrombosis in the upper extremity. No evidence of superficial vein thrombosis in the upper extremity. However, unable to visualize the basilic vein.  Left: No evidence of thrombosis in the subclavian.  *See table(s) above for measurements and observations.  Diagnosing physician: Coral Else MD Electronically signed by Coral Else MD on 04/07/2021 at 7:29:05 PM.    Final     Microbiology: No results found for this or any previous visit (from the past 240 hour(s)).   Labs: Basic Metabolic Panel: Recent Labs  Lab 04/10/21 0848 04/15/21 0819  NA 139 141  K 3.8 3.6  CL 102 102  CO2 31 29  GLUCOSE 110* 103*  BUN 16 15  CREATININE 0.59 0.65  CALCIUM 9.4 9.8  MG 1.9  --    Liver Function Tests: Recent Labs  Lab 04/15/21 0819  AST 27  ALT 60*  ALKPHOS 77  BILITOT 0.7  PROT 7.1  ALBUMIN 3.6   No results  for input(s): LIPASE, AMYLASE in the last 168 hours. No results for input(s): AMMONIA in the last 168 hours. CBC: Recent Labs  Lab 04/10/21 0848 04/15/21 0819  WBC 3.9* 3.7*  NEUTROABS  --  1.5*  HGB 10.1* 10.6*  HCT 32.5* 33.0*  MCV 79.5* 78.4*  PLT 252 223   Cardiac Enzymes: No results for input(s): CKTOTAL, CKMB, CKMBINDEX, TROPONINI in the last 168 hours. BNP: BNP (last 3 results) No results for input(s): BNP in the last 8760 hours.  ProBNP (last 3 results) No results for input(s): PROBNP in the last 8760 hours.  CBG: Recent Labs  Lab 04/14/21 0215 04/14/21 0400 04/14/21 0526 04/15/21 0343 04/15/21 0619  GLUCAP 152* 149* 109* 105* 90    Agree with above NP note.  Patient was seen and examined individually. Agree with post discharge recommendations.  Signed:  Junious Silk ANP [facility] 04/16/2021, 7:36 AM

## 2021-04-15 NOTE — Progress Notes (Signed)
Speech Language Pathology Treatment: Dysphagia;[name] Speaking valve;Cognitive-Linquistic  Patient Details Name: Charlene [name] MRN: [id number] DOB: 1965-10-08 Today's Date: 04/15/2021 Time: 4174-0814 SLP Time Calculation (min) (ACUTE ONLY): 20 min  Assessment / Plan / Recommendation Clinical Impression  Continued dysphagia, cognitive linguistic intervention. SLP placed PMSV without difficulty. Pt alert, upright at bedside, no family present; per RN patients father coming later today. Provided diligent oral care. SLP facilitated puree and thin liquid snack. Pt with good oral readiness, opening mouth when presented with PO. She didn't make any phonatory attempts to communicate despite cues but did appropriately gesture (head nod and shake for preference PO when asked by SLP). She continues to exhibit right gaze preference with heavy left neglect. Pt continues to exhibit deficits orally with POs (anterior spillage at midline, reduced bolus cohesion, oral residuals, and prolonged bolus propulsion). She did appear clinically to have less anterior spillage as compared to prior interactions with this SLP. Pt was unable to syphoon thin liquids via straw this date, despite attempts. Provided thins via tsp and cup. With cup sip overt cough followed. No coughing noted with thins via tsp. Recommend continue pleasure POs with trained caregivers, staff as pt tolerates. Pt will continue to need long term alternative nutrition to supplement as her PO intake remains reduced. SLP to continue to follow during acute stay.    HPI HPI: Pt is 56 y/o female presents to [facility] on 2/9 with L sided weakness and headaches. PMH includes anxiety, HTN. 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 S/p Right frontal ventricular catheter placement on 2/10, stopped functioning on 2/11.  MRI 2/11  shows right greater than left mesial temporal lobes / parahippocampal cortex, bilateral basal ganglia, and possibly the upper pons that are concerning for acute infarcts, most likely secondary to mass effect from the hemorrhage. ETT 2/10; trach/PEG 09/10/20.  Trach changed to #6 cuffless 3/11; #4 cuffless 5/4. Has had two MBS studies, the last on 7/8 - pt started on dysphagia 1, thin liquids. Trach replaced following self-decannulation 7/21, again on 8/23.      SLP Plan  Continue with current plan of care      Recommendations for follow up therapy are one component of a multi-disciplinary discharge planning process, led by the attending physician.  Recommendations may be updated based on patient status, additional functional criteria and insurance authorization.    Recommendations  Diet recommendations: Dysphagia 1 (puree);Thin liquid Liquids provided via: Teaspoon;Straw;Cup Medication Administration: Via alternative means Supervision: Staff to assist with self feeding;Trained caregiver to feed patient;Full supervision/cueing for compensatory strategies Compensations: Minimize environmental distractions;Slow rate;Small sips/bites Postural Changes and/or Swallow Maneuvers: Seated upright 90 degrees;Upright 30-60 min after meal      Patient may use Passy-Muir Speech Valve: During all waking hours (remove during sleep) PMSV Supervision: Intermittent         Oral Care Recommendations: Oral care BID Follow up Recommendations: Skilled Nursing facility SLP Visit Diagnosis: Dysphagia, oropharyngeal phase (R13.12);Cognitive communication deficit (R41.841) Plan: Continue with current plan of care       GO                Ardyth Gal MA, CCC-SLP Acute Rehabilitation Services    04/15/2021, 10:44 AM

## 2021-04-15 NOTE — Progress Notes (Signed)
[facility] PROGRESS NOTE  Charlene [name] YCX:[id number] DOB: Dec 09, 1965 DOA: 08/26/2020 PCP: Patient, [name] (Inactive)    October 08, 2020     10/16/2020                        10/16/2020     10/18/2020                        10/20/2020      12/30/2020                   12/30/2020     01/04/2021                   02/24/2021 Viv really needs a teddy bear!   Status: Remains inpatient appropriate because:Altered mental status and Unsafe d/c plan: Requirement to discharge to trach capable SNF   Dispo: The patient is from: Home              Anticipated d/c is to: SNF trach capable-Unable to decannulate 2/2 and lack of appropriate voluntary and involuntary cough response              Patient currently is medically stable to d/c.   Difficult to place patient Yes         Barriers to DC: Recently has had mitten off/mitten discontinue but so far has tolerated x24 hours   Level of care: Telemetry Medical  Code Status: Full Family Communication: Extensive conversation held with husband [name] by phone on 9/27; updated abdominal 9/28 that patient is ready for discharge and she has a bed offer at an SNF in [facility].  9/29 SW also spoke with [name] about upcoming discharge DVT prophylaxis: Chronically bedbound so Lovenox discontinued   Vaccination status: Unvaccinated-husband has agreed to [employer] vaccine but only if required by accepting facility  Micro Data:  2/9 COVID + Influenza negative 2/10 MRSA PCR negative 2/12 BCx 2 > no growth  2/19 BC 1/2 +Stap EPI, 1/2 no growth  10/07/2020 sputum positive for Serratia marcescens 4/8 Enterobacter cloaca + urine culture MDR sensitive only to imipenem and gentamicin 5/20 urine positive for Klebsiella resistant to ampicillin and Macrodantin 6/2 urine positive for Klebsiella resistant to ampicillin, cefazolin and Macrodantin 7/4 urine positive for ESBL Klebsiella resistant to ampicillin, cefazolin and Macrodantin 7/28 urine  positive for Enterobacter resistant to cefazolin, cefepime and Septra 9/2 urine positive for pansensitive Pseudomonas     Antimicrobials:  Cefepime 2/12 > 2/15 Vanc 2/12 > 2/15 10/08/2020 ciprofloxacin>>3/30 Unasyn 3/30 >> 4/02 Rocephin 4/11 > 4/12 Meropenem 4/12>4/17 Meropenem 5/20 through 5/22 Ceftriaxone 5/22 through 5/26 Meropenem 6/3 Ceftriaxone 6/4 through 6/10 Septra 7/4 through 7/7 Ceftriaxone 7/29 was 1 dose followed by 1 dose of fosfomycin Cipro 9/2 >>   HPI: 55 yo F with 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  Subsequently on 2/10, she developed obtundation and respiratory arrest leading to intubation.  She has remained persistently obtunded, now has a trach and a PEG.  Hospital course complicated by persistent fever, Serratia pneumonia and now pre-orbital cellulitis  As of 3/25 with adjustments in hypertonicity medications patient began to improve regarding hypertonicity.  Eventually started on provigil with improvements in alertness.  Evaluated by rehab physician who recommended increasing provigil dose further.  Subsequently patient has become more alert.  Most days she is able to track and with encouragement able  to follow simple commands especially since partial obstruction glasses placed.  See progress note dictated 4/27 for expanded details.   Significant events: 2/09 Admit for ICH 2/10 EVD placed, later intubated due to obtundation and respiratory arrest 2/11 MRI Brain, hypertonic saline d/c'd, EVD not working.  Husband did not consent to PICC  2/13 EVD removed 2/17 [place] discussion with family, PMT 2/24 Bedside trach and PEG 3/1 Transferred to [place]  Subjective: Continues to have some mild right ear pain; also having pain in throat with swallowing that may be radiating from the right ear  Objective: Vitals:   04/14/21 1953 04/15/21 0400  BP: 119/73 130/82  Pulse: 79 85   Resp:  18  Temp: 98.5 F (36.9 C) 98.7 F (37.1 C)  SpO2: 98% 97%   Filed Weights   04/09/21 0500 04/10/21 0500 04/11/21 0500  Weight: 67.4 kg 67.4 kg 68 kg    Exam:       Gen: Awake and alert        ENT: Right TM more reddened today and both TMs are now bulging and difficult to make out anatomy beneath TMs Respiratory: #4.0 cuffless trach, trach collar with FiO2 21% at 5L; lungs clear, no increased work of breathing and no excessive tracheal secretions Cardiovascular: Heart sounds S1-S2, mildly hypertensive today in context of bilateral ear pain, no peripheral edema, regular pulse Abdomen:  PEG /nocturnal tube feedings. LBM 9/28, nondistended, normoactive bowel sounds.   Neurological: CN 2 -12 intact. Purposeful movement of right upper extremity--strength 4/5.   RLE strength 1-2/5 w/ purposeful movement.  Persistent left-side neglect.  Psychiatric: Awake and alert -appears to be comfortable  Assessment/Plan: Acute problems: Multiple acute strokes  Right Thalamus ICH with IVH  Obstructive Hydrocephalus/left-sided neglect /Non TBI related hypertonicity -Continue HS baclofen -Continues with LUE neglect requiring soft splint.  Moves both LEs right much greater than left -Husband would like to participate with therapies.  He is available every Tuesday at 11 AM.  Message sent to therapy team  Acute chronic bilateral otitis media/bilateral middle ear effusion -History of chronic sinusitis which is likely the etiology of current issue -Continue Flonase to assist with opening up eustachian tube -Continue Cipro/steroid otic 2 drops BID for 7 days -Continue scheduled ibuprofen; continue previously ordered Ultram prn -Add Afrin twice daily x5 days -Reexamined patient on 9/29 and noted right TM more reddened concerning for possible acute otitis media.  Have started Augmentin suspension for a total of 7 days  Severe dental disease -Family reports history of chronic dental pain with patient  unable to obtain care secondary to financial limitation -Sinus CT in June 2022 revealed severe dental disease with periapical lucencies of both maxillary first molars and the on erupted right mandibular third molar.  There was also osteolysis of the maxillary cortex at right first molar root but there was no soft tissue abscess or fluid collection  Tracheostomy dependent -PCCM/trach team following closely regarding candidacy for decannulation. -Husband and family agree risk outweighs benefit to decannulate -Mitten discontinued and has been off for at least 24 hours  Microcytic anemia with known previous iron deficiency Continues with low MCV and hemoglobin 11-repeat anemia panel in September normal  Fever 2/2 Pseudomonas UTI/history of ESBL Klebsiella UTI Completed 7 days of Cipro recent Pseudomonas UTI Continue contact isolation  Constipation -Continue prn MiraLAX  Hypertensive emergency/Malignant HTN -Continue Norvasc, beta-blocker and hydralazine.  Blood pressure well controlled  Dysphagia -Continue dysphagia 1 with thin liquids-Inconsistent oral intake therefore -continue nocturnal tube  feedings  History of urinary retention -Continue Cardura  Depression -Stable. -Explained how SSRIs work and this would be my recommendation for a medication.  Both he and Viv historically do not like to take medications he will think about it and discuss with the family given her symptoms seem to be somewhat severe and be influencing her ability to participate with therapies and her desire to eat  Right upper extremity thrombophlebitis - [name] -> age indeterminate SVT of right cephalic vein-RUE ultrasound pending-as a precaution attending physician on 9/20 opted to initiate pharmacologic VTE prophylaxis after clearing with NS Xu -Duplex 9/21 w/o SVT or DVT  Neck Contracture/torticollis -- Resolved  Recurrent right knee pain/known ligamentous injury and chronic osteoarthritis -s/p injection of  Marcaine and Depomedrol 6/30 -9/19 given blackbox warnings regarding stroke and MI dc'd preadmission Celebrex  Chronic pansinusitis/recurrent fevers 2/2 aspiration PNA;History of Serratia Pneumonia  Hospital Acquired Pneumonia:  -Completed several courses of antibiotics -Follow-up imaging including sinus CT consistent with chronic but stable and resolving sinusitis  HLD -Continue Lipitor  Data Reviewed: CBG: Recent Labs  Lab 04/13/21 1554 04/13/21 2020 04/14/21 0215 04/14/21 0400 04/14/21 0526  GLUCAP 98 91 152* 149* 109*       Scheduled Meds:  acetaminophen  650 mg Per Tube Q6H   amLODipine  10 mg Per Tube Daily   atorvastatin  40 mg Per Tube Daily   baclofen  5 mg Per Tube QHS   carvedilol  25 mg Per Tube BID WC   chlorhexidine gluconate (MEDLINE KIT)  15 mL Mouth Rinse BID   Chlorhexidine Gluconate Cloth  6 each Topical Daily   ciprofloxacin-fluocinolone PF  0.25 mL Both EARS BID   diclofenac Sodium  4 g Topical QID   doxazosin  2 mg Per Tube Daily   enoxaparin (LOVENOX) injection  40 mg Subcutaneous Q24H   feeding supplement  237 mL Per Tube TID BM   feeding supplement (JEVITY 1.5 CAL/FIBER)  750 mL Per Tube Q24H   feeding supplement (PROSource TF)  45 mL Per Tube Daily   fluticasone  2 spray Each Nare Daily   free water  200 mL Per Tube Q4H   hydrALAZINE  37.5 mg Per Tube Q8H   ibuprofen  400 mg Per Tube Q6H   mouth rinse  15 mL Mouth Rinse 5 X Daily   modafinil  200 mg Per Tube Daily   multivitamin with minerals  1 tablet Per Tube Daily   oxymetazoline  1 spray Each Nare BID   pantoprazole sodium  40 mg Per Tube Daily   Continuous Infusions:  sodium chloride Stopped (10/26/20 1445)    Principal Problem:   ICH (intracerebral hemorrhage) (HCC) Active Problems:   Acute hypoxemic respiratory failure (HCC)   Tracheostomy dependent (HCC)   Palliative care by specialist   Muscle hypertonicity   Oropharyngeal dysphagia   Poor prognosis   Aspiration  pneumonia of right lower lobe (HCC)   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  Palliative care.   PCCM continues to follow.  No decannulation.   Procedures: Tracheostomy PEG  tube EEG Echocardiogram  Antibiotics: Vancomycin 2/12-2/15 Maxipime 2/12-2/15 Ancef 2/19-2/27 Vancomycin 2/20 x 1 dose Maxipime 2/27- 3/5 Vancomycin 3/1-3/3 Ancef 3/6-3/14 Cipro 3/20 4-3/30 Unasyn 3/30-4/4 Rocephin 4/11-4/12 Meropenem 4/12-4/17 Cipro 9/2 >> 9/8   Time spent: 15 minutes    Erin Hearing ANP  [facility] 7 am - 330 pm/M-F for direct patient care and secure chat Please refer to [place] for contact info [phone]  Days

## 2021-04-15 NOTE — Progress Notes (Addendum)
2pm: CSW spoke with Morrie Sheldon, RN and patient's mother at bedside regarding discharge plan.  1pm: Facility does not require the patient to be vaccinated against COVID prior to admission - she will be quarantined for 14 days after she arrives.  10am: CSW received admissions paperwork and sent to [name] for completion - CSW requested [name] complete paperwork prior to 9am on 04/16/2021 so it can be sent to the facility before the patient is discharged.  8:20am: CSW spoke with patient's husband [name] to discuss discharge plan. [name] states he will review the admissions paperwork and sign it tomorrow morning - he wishes to give additional time for Medicaid to be processed. CSW reminded [name] that Modene's transportation is scheduled for tomorrow at 9:30am.  CSW spoke with [place] at [facility] - she will provide CSW with admissions paperwork around noon today.  Edwin Dada, MSW, LCSW Transitions of Care  Clinical Social Worker II [phone]

## 2021-04-16 DIAGNOSIS — K089 Disorder of teeth and supporting structures, unspecified: Secondary | ICD-10-CM

## 2021-04-16 DIAGNOSIS — M1711 Unilateral primary osteoarthritis, right knee: Secondary | ICD-10-CM | POA: Diagnosis present

## 2021-04-16 DIAGNOSIS — E785 Hyperlipidemia, unspecified: Secondary | ICD-10-CM | POA: Diagnosis present

## 2021-04-16 DIAGNOSIS — I808 Phlebitis and thrombophlebitis of other sites: Secondary | ICD-10-CM | POA: Clinically undetermined

## 2021-04-16 DIAGNOSIS — Z87898 Personal history of other specified conditions: Secondary | ICD-10-CM

## 2021-04-16 LAB — GLUCOSE, CAPILLARY: Glucose-Capillary: 136 mg/dL — ABNORMAL HIGH (ref 70–99)

## 2021-04-16 LAB — RESP PANEL BY RT-PCR (FLU A&B, COVID) ARPGX2
Influenza A by PCR: NEGATIVE
Influenza B by PCR: NEGATIVE
SARS Coronavirus 2 by RT PCR: NEGATIVE

## 2021-04-16 NOTE — Progress Notes (Signed)
PMV removed by family at this time and placed into purple cup. Trach care performed on pt and aerosol trach collar placed back on pt at this time. RT will continue to monitor and be available as needed.

## 2021-04-16 NOTE — Progress Notes (Addendum)
2:15pm: CSW sent updated COVID results to Cassandra at [facility].  The patient will go to room 310. The number to call report is [phone] or [phone].  12:35pm: CSW spoke with Cassandra at [facility] who states the facility will accept the patient at this time based off her COVID test from 03/19/21 - new results will be sent once available.  CSW notified RN of updates.  CSW called for [facility] for pick up as first available.   10:20am: Patient has been accepted at [facility]'s sub acute unit. The accepting MD is Dr. Marcene Brawn. Patient will be transported via [facility]. CSW will wait for COVID to be sent to the lab prior to calling [facility].  Patient's husband and family aware of discharge plan.  Edwin Dada, MSW, LCSW Transitions of Care  Clinical Social Worker II [phone]

## 2021-07-30 ENCOUNTER — Emergency Department (HOSPITAL_COMMUNITY): Payer: Medicaid Other

## 2021-07-30 ENCOUNTER — Other Ambulatory Visit: Payer: Self-pay

## 2021-07-30 ENCOUNTER — Emergency Department (HOSPITAL_COMMUNITY)
Admission: EM | Admit: 2021-07-30 | Discharge: 2021-07-31 | Disposition: A | Discharge status: Skilled Nursing Facility | Payer: Medicaid Other | Attending: Emergency Medicine | Admitting: Emergency Medicine

## 2021-07-30 ENCOUNTER — Encounter (HOSPITAL_COMMUNITY): Payer: Self-pay

## 2021-07-30 DIAGNOSIS — Z20822 Contact with and (suspected) exposure to covid-19: Secondary | ICD-10-CM | POA: Insufficient documentation

## 2021-07-30 DIAGNOSIS — K9429 Other complications of gastrostomy: Secondary | ICD-10-CM | POA: Diagnosis not present

## 2021-07-30 DIAGNOSIS — T85528A Displacement of other gastrointestinal prosthetic devices, implants and grafts, initial encounter: Secondary | ICD-10-CM

## 2021-07-30 DIAGNOSIS — N823 Fistula of vagina to large intestine: Secondary | ICD-10-CM | POA: Diagnosis not present

## 2021-07-30 DIAGNOSIS — R1310 Dysphagia, unspecified: Secondary | ICD-10-CM | POA: Insufficient documentation

## 2021-07-30 DIAGNOSIS — R102 Pelvic and perineal pain: Secondary | ICD-10-CM | POA: Insufficient documentation

## 2021-07-30 HISTORY — PX: IR REPLACE G-TUBE SIMPLE WO FLUORO: IMG2323

## 2021-07-30 HISTORY — DX: Cerebral infarction, unspecified: I63.9

## 2021-07-30 LAB — BASIC METABOLIC PANEL
Anion gap: 7 (ref 5–15)
BUN: 6 mg/dL (ref 6–20)
CO2: 32 mmol/L (ref 22–32)
Calcium: 9.6 mg/dL (ref 8.9–10.3)
Chloride: 102 mmol/L (ref 98–111)
Creatinine, Ser: 0.49 mg/dL (ref 0.44–1.00)
GFR, Estimated: >60 mL/min (ref >60)
Glucose, Bld: 112 mg/dL — ABNORMAL HIGH (ref 70–99)
Potassium: 4 mmol/L (ref 3.5–5.1)
Sodium: 141 mmol/L (ref 135–145)

## 2021-07-30 LAB — CBC WITH DIFFERENTIAL/PLATELET
Abs Immature Granulocytes: 0.02 10*3/uL (ref 0.00–0.07)
Basophils Absolute: 0 10*3/uL (ref 0.0–0.1)
Basophils Relative: 0 %
Eosinophils Absolute: 0 10*3/uL (ref 0.0–0.5)
Eosinophils Relative: 1 %
HCT: 30.3 % — ABNORMAL LOW (ref 36.0–46.0)
Hemoglobin: 9.5 g/dL — ABNORMAL LOW (ref 12.0–15.0)
Immature Granulocytes: 0 %
Lymphocytes Relative: 25 %
Lymphs Abs: 1.6 10*3/uL (ref 0.7–4.0)
MCH: 25.1 pg — ABNORMAL LOW (ref 26.0–34.0)
MCHC: 31.4 g/dL (ref 30.0–36.0)
MCV: 79.9 fL — ABNORMAL LOW (ref 80.0–100.0)
Monocytes Absolute: 0.3 10*3/uL (ref 0.1–1.0)
Monocytes Relative: 4 %
Neutro Abs: 4.5 10*3/uL (ref 1.7–7.7)
Neutrophils Relative %: 70 %
Platelets: 228 10*3/uL (ref 150–400)
RBC: 3.79 MIL/uL — ABNORMAL LOW (ref 3.87–5.11)
RDW: 15.2 % (ref 11.5–15.5)
WBC: 6.4 10*3/uL (ref 4.0–10.5)
nRBC: 0 % (ref 0.0–0.2)

## 2021-07-30 LAB — RESP PANEL BY RT-PCR (FLU A&B, COVID) ARPGX2
Influenza A by PCR: NEGATIVE
Influenza B by PCR: NEGATIVE
SARS Coronavirus 2 by RT PCR: NEGATIVE

## 2021-07-30 IMAGING — DX DG ABDOMEN 1V
1 series · 1 of 1 positions shown · non-contrast
Comparison: [DATE]

CLINICAL DATA: Percutaneous gastrostomy tube placement.

EXAM:
ABDOMEN - 1 VIEW

[abdomen kub]
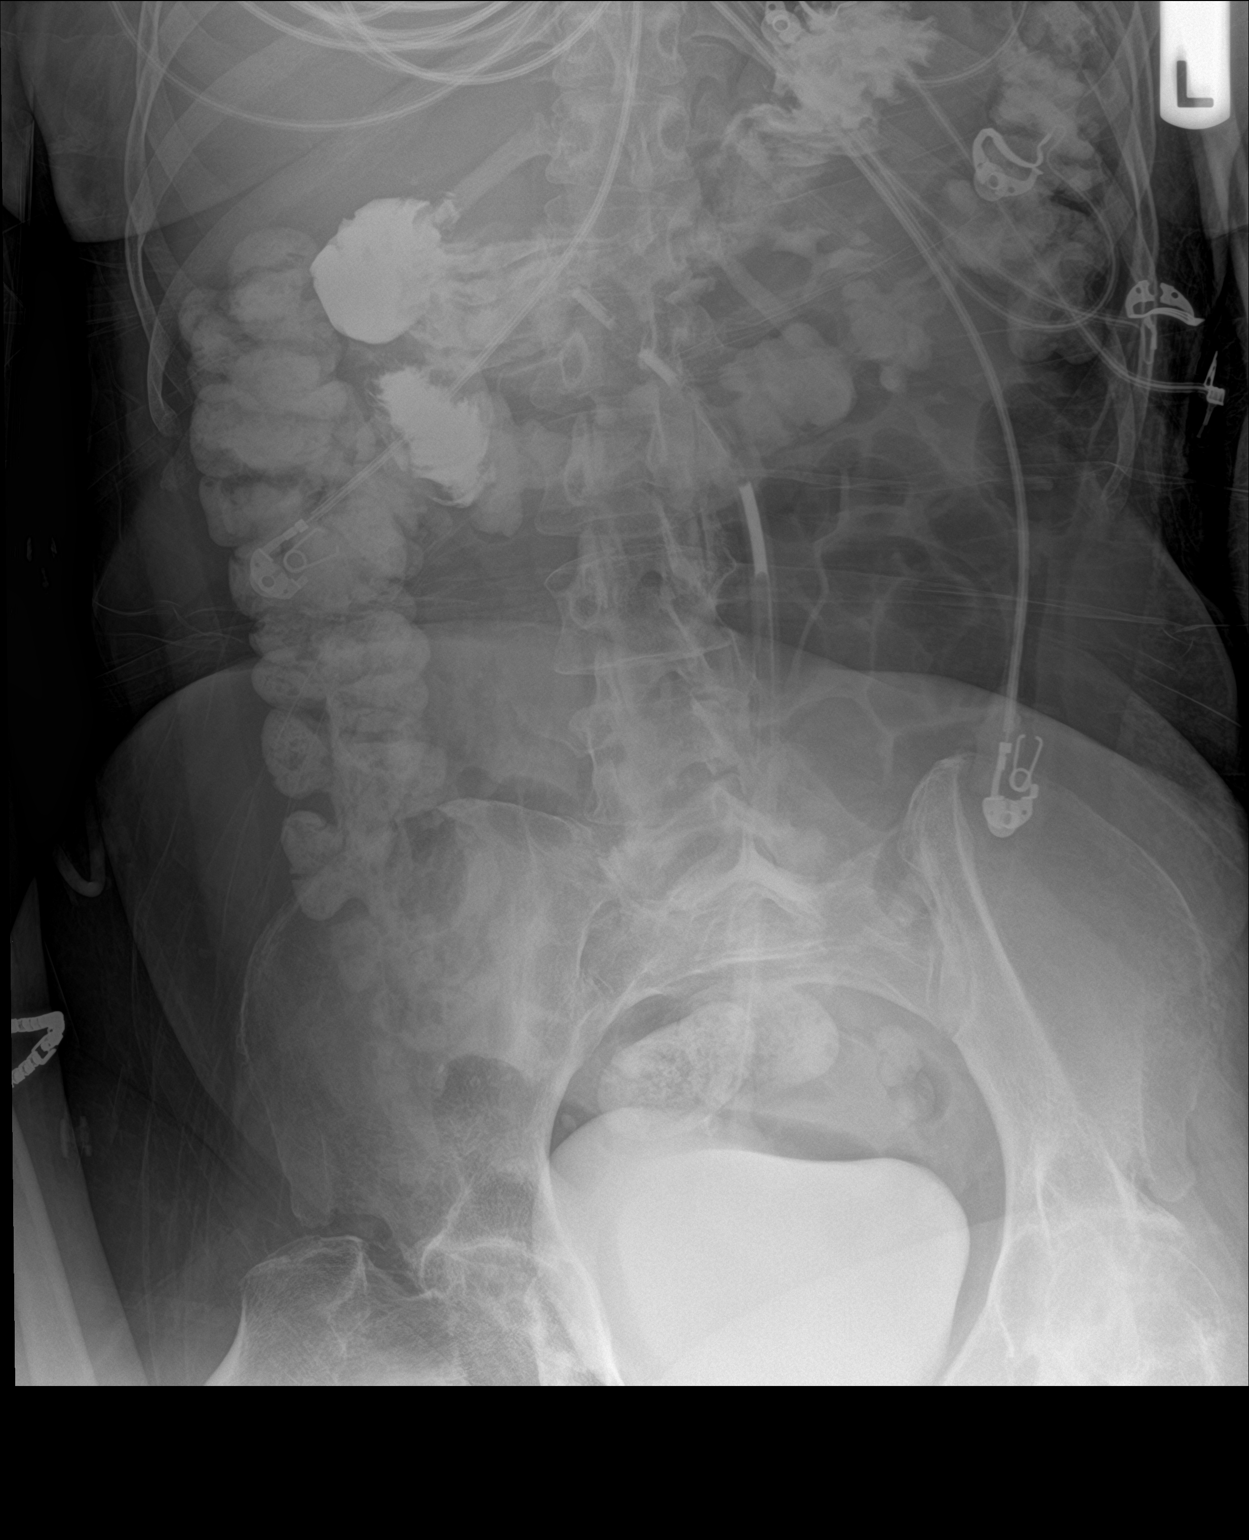

[1 of 1 positions shown; findings below may reference images not displayed]

FINDINGS: Percutaneous gastrostomy tube overlies the distal stomach. Injected
contrast material is seen within the stomach and duodenal C-loop.
Some contrast material also seen within the right colon, likely from
prior CT. No evidence of dilated bowel loops.
IMPRESSION: Percutaneous gastrostomy tube in the distal stomach, with injected
contrast material seen in the stomach and duodenum.

## 2021-07-30 IMAGING — CT CT ABD-PELV W/ CM
2 of 9 series · 14 of 46 positions shown, 16 images · IV contrast (APPLIED)
Comparison: None.

CLINICAL DATA: Peg tube placement. Concern for rectovaginal
fistula.

EXAM:
CT ABDOMEN AND PELVIS WITH CONTRAST
TECHNIQUE: Multidetector CT imaging of the abdomen and pelvis was performed
using the standard protocol following bolus administration of
intravenous contrast.

[Series 4: abdomen 5.0 · axial · 0.82mm/px · z∈[-718,-318]mm · 11 of 98 slices shown, 13 images]
[im 9/98  soft-tissue]
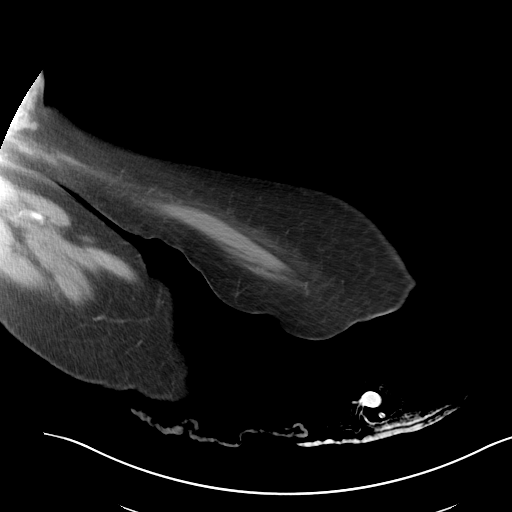
[im 9/98  bone]
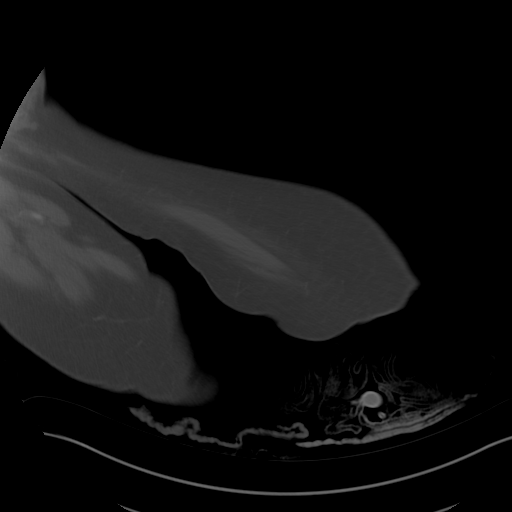
[im 17/98  soft-tissue]
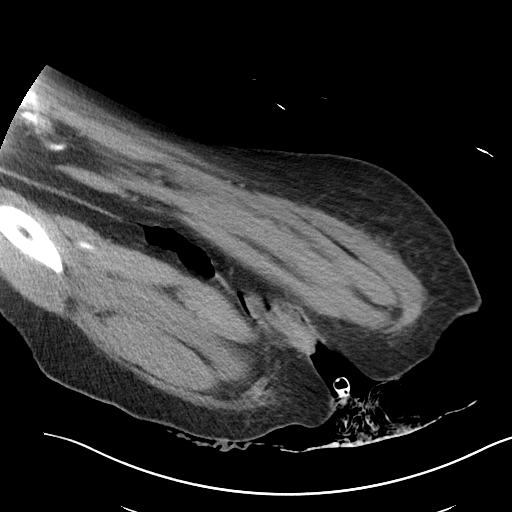
[im 25/98  soft-tissue]
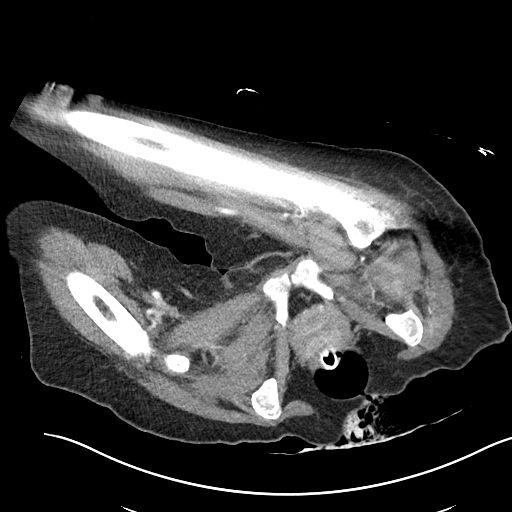
[im 33/98  soft-tissue]
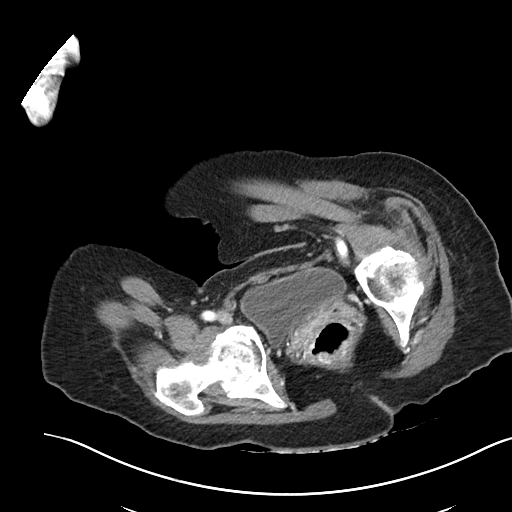
[im 41/98  soft-tissue]
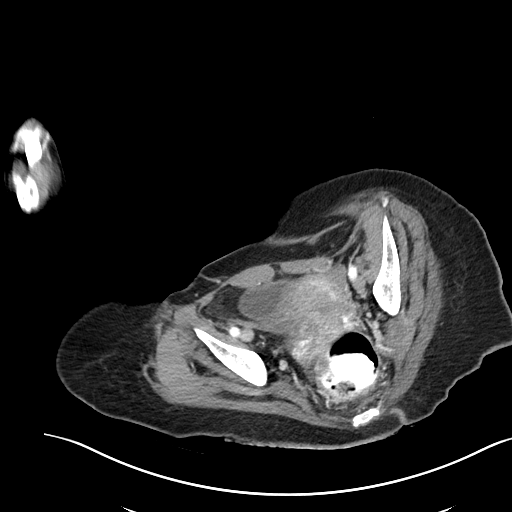
[im 49/98  soft-tissue]
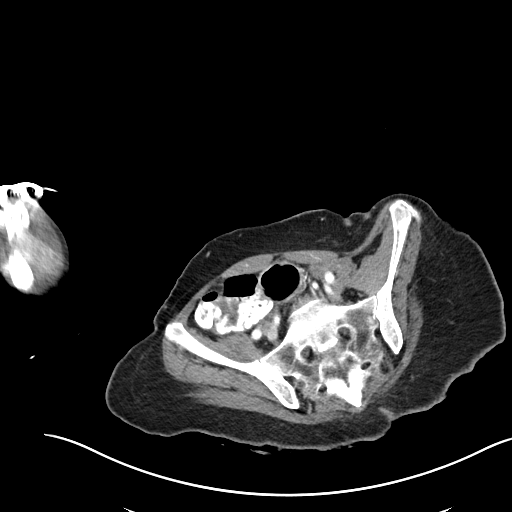
[im 57/98  soft-tissue]
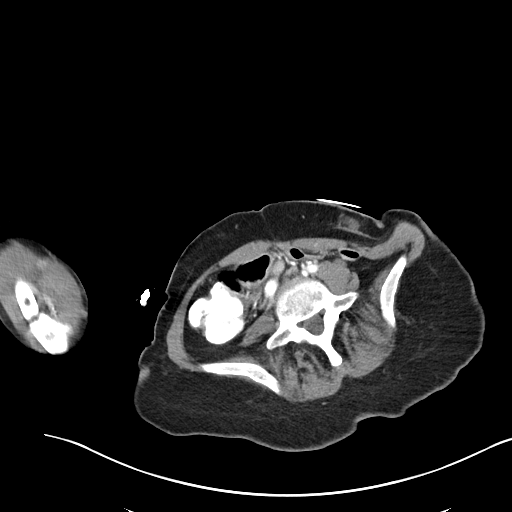
[im 65/98  soft-tissue]
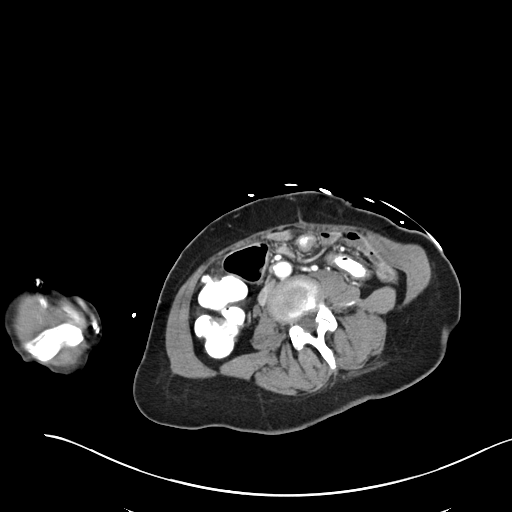
[im 73/98  soft-tissue]
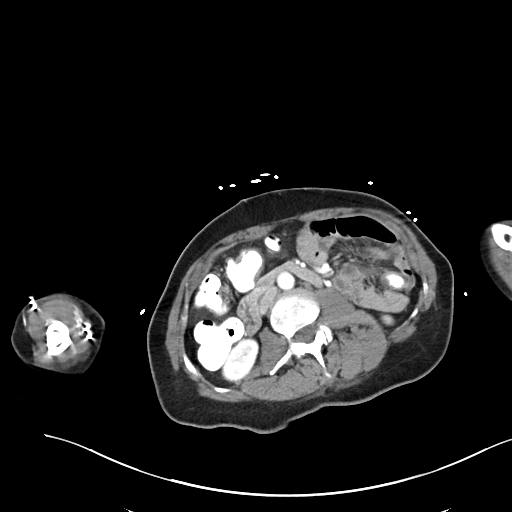
[im 73/98  bone]
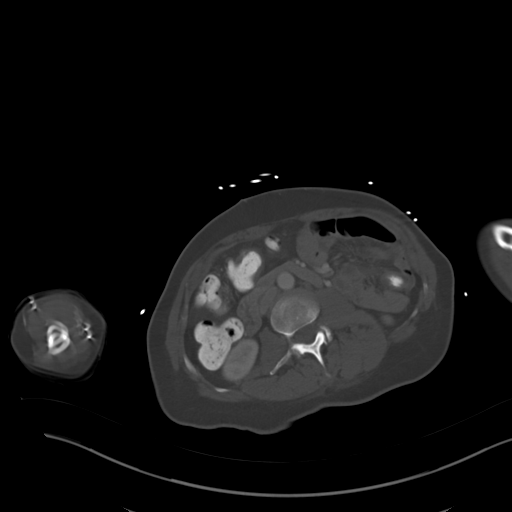
[im 81/98  soft-tissue]
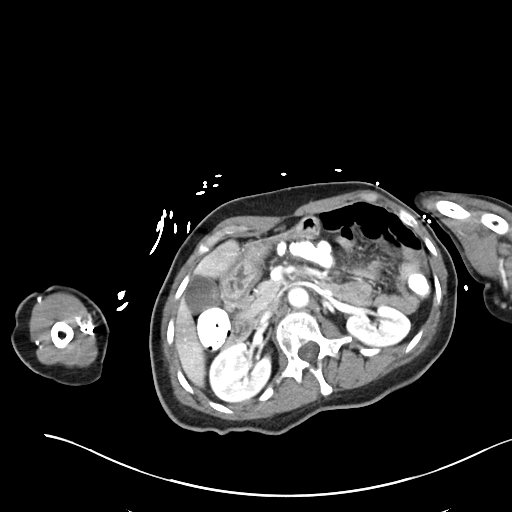
[im 89/98  soft-tissue]
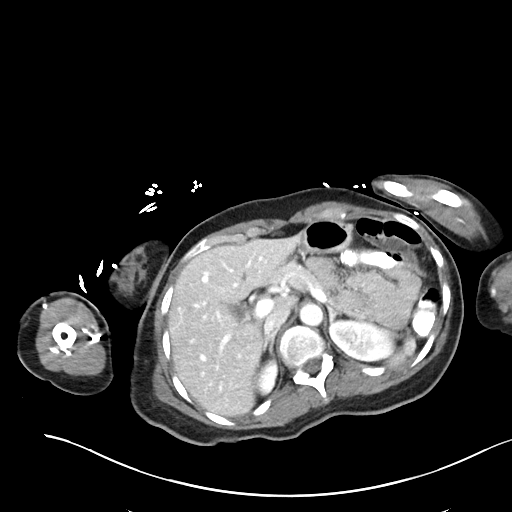

[Series 12: abdomen 3.0 mpr cor · coronal · 0.33mm/px · 3 of 73 slices shown]
[im 19/73  soft-tissue]
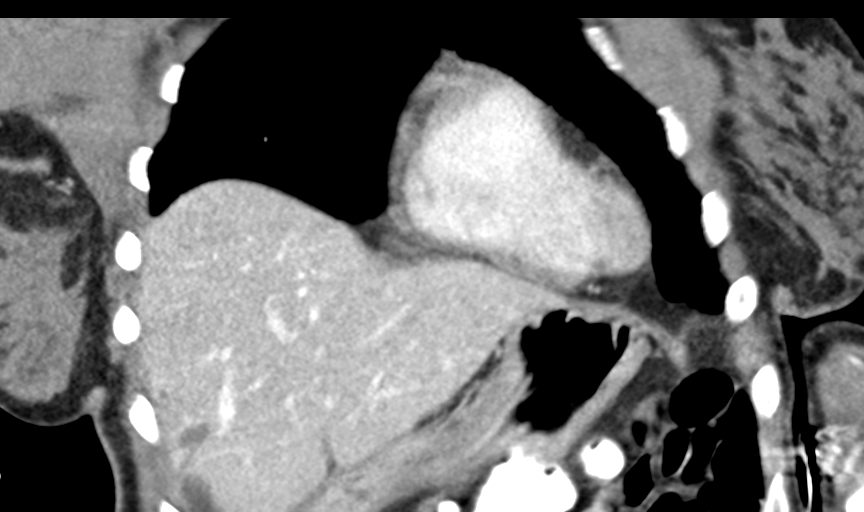
[im 37/73  soft-tissue]
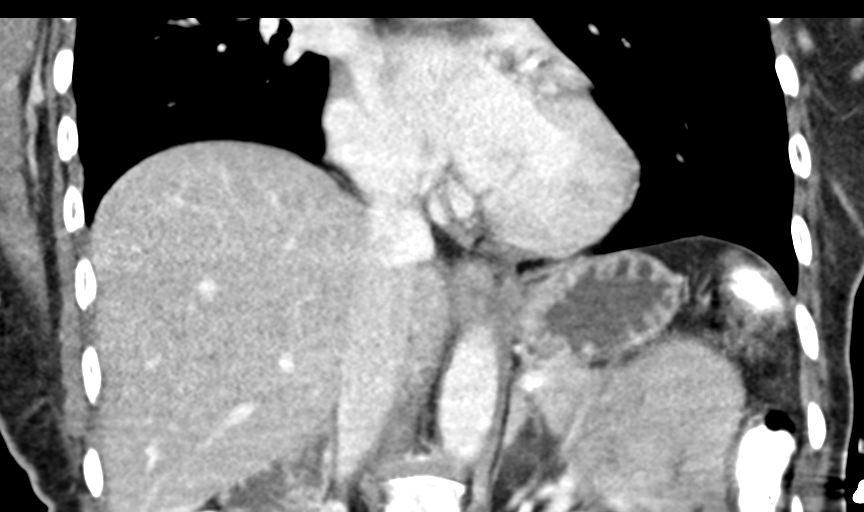
[im 55/73  soft-tissue]
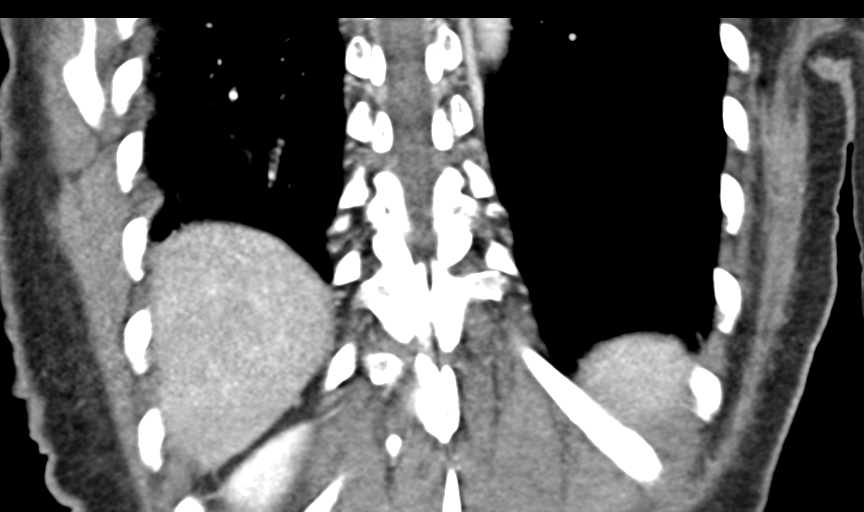

[14 of 46 positions shown; findings below may reference images not displayed]

Attempt to administer rectal contrast. 100 cc administered.
resistance encountered

RADIATION DOSE REDUCTION: This exam was performed according to the
departmental dose-optimization program which includes automated
exposure control, adjustment of the mA and/or kV according to
patient size and/or use of iterative reconstruction technique.

CONTRAST:  100mL OMNIPAQUE IOHEXOL 300 MG/ML SOLN, 25mL OMNIPAQUE
IOHEXOL 300 MG/ML SOLN
FINDINGS: Lower chest: Lung bases are clear.

Hepatobiliary: No focal hepatic lesion. No biliary duct dilatation.
Common bile duct is normal.

Pancreas: Pancreas is normal. No ductal dilatation. No pancreatic
inflammation.

Spleen: Normal spleen

Adrenals/urinary tract: Adrenal glands and kidneys are normal. The
ureters and bladder normal.

Stomach/Bowel: Stomach is normal in orientation. Small bowel normal.
Oral contrast within ascending and transverse colon. Oral contrast
in the descending colon.

Oral contrast extends to the proximal rectum.

Enema tip in  the distal rectum.  Minimal rectal contrast.

There is no clear evidence of communication with the vagina. There
is gas in the vagina however again no clear evidence of contrast in
the vagina. Difficult to ascertain if there is small noncontrast
vagina versus contrast enhancement from IV contrast. No clear
evidence of communication with the vagina and rectum.

Vascular/Lymphatic: Abdominal aorta is normal caliber. No periportal
or retroperitoneal adenopathy. No pelvic adenopathy.

Reproductive: Uterus normal adnexa normal

Other: No free fluid.

Musculoskeletal: Contracted patient. Nonstandard position. Surgeon
change of the hips.
IMPRESSION: 1. No clear evidence of rectovaginal fistula. There is gas within
the vagina which can be a normal finding. Small volume of rectal
contrast was administered as difficult patient.
2. Stomach is normal in orientation.
3. No bowel obstruction.

## 2021-07-30 MED ORDER — DIATRIZOATE MEGLUMINE & SODIUM 66-10 % PO SOLN
20.0000 mL | Freq: Once | ORAL | Status: AC
  Administered 2021-07-30: 20 mL
  Filled 2021-07-30: qty 30

## 2021-07-30 MED ORDER — IOHEXOL 300 MG/ML  SOLN
25.0000 mL | Freq: Once | INTRAMUSCULAR | Status: AC | PRN
  Administered 2021-07-30: 25 mL

## 2021-07-30 MED ORDER — IOHEXOL 300 MG/ML  SOLN
100.0000 mL | Freq: Once | INTRAMUSCULAR | Status: AC | PRN
  Administered 2021-07-30: 100 mL via INTRAVENOUS

## 2021-07-30 NOTE — ED Notes (Signed)
Alison Murray husband [phone] requesting an update

## 2021-07-30 NOTE — ED Notes (Signed)
Called [facility] to transport patient to [facility]; Room 310B. Pt is 19th on the list

## 2021-07-30 NOTE — Discharge Instructions (Signed)
Your tube was replaced by interventional radiology and the imaging confirmed its placement.  Please follow-up with your primary team.  If any symptoms change or worsen acutely, return to the nearest emergency department.

## 2021-07-30 NOTE — ED Notes (Signed)
Pt cleaned up, brief changed and gown changed.

## 2021-07-30 NOTE — ED Notes (Signed)
Patient transported to X-ray 

## 2021-07-30 NOTE — ED Notes (Signed)
Husband at bedside and pt assisted to sit up on stretcher. Husband feeding pt at this time. Pt also changed and cleansed of urine, new brief applied and sheets fixed, pt pulled up in the bed. Pt continues to wait for [facility] transport. No acute changes noted. Will continue to monitor.

## 2021-07-30 NOTE — ED Notes (Signed)
Pureed dinner tray ordered for pt. No family at bedside. Pt will need to be fed.

## 2021-07-30 NOTE — Procedures (Signed)
Patient seen  at bedside for gastrostomy tube site replacement procedure. No placeholder present.   Dilated erxisting track up to 18 Fr. New tube inserted with out difficulty 9 ml of normal saline instilled in the retention balloon port and the flange repositioned for comfort.   **Confirmation abdominal xray confirming placement pending. Once confirmed  Ok to begin using g-tube for medicines, tube feeds, free water, etc.  Once confirmed by xray.   Please call IR for any questions or concerns regarding the g-tube.

## 2021-07-30 NOTE — ED Provider Notes (Signed)
[facility] EMERGENCY DEPARTMENT Provider Note   CSN: [id number] Arrival date & time: 07/30/21  1027     History  Chief Complaint  Patient presents with   Peg Tube Dislodged   Rectal Pain   Vaginal Pain    Charlene [name] is a 56 y.o. female.  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 History is initially from EMS only as she is non-verbal. Facility did a CT w/o contrast which was non-diagnostic. They also note that patient's g-tube fell out this morning (around 9 am?) and are asking for it to be replaced.   I was able to discuss with her facility doctor, Dr. Leana Gamer.  She has had feculent material out of her vagina for 3 days.  He is asking for CT with rectal contrast given the nondiagnostic CT done earlier.  Otherwise she has not been ill and is at baseline.  He is not aware of the G-tube falling out and so it is unclear when it came out today.  Home Medications Prior to Admission medications   Medication Sig Start Date End Date Taking? Authorizing Provider  acetaminophen (TYLENOL) 325 MG tablet Place 2 tablets (650 mg total) into feeding tube every 6 (six) hours. 04/15/21   Samella Parr, NP  amLODipine (NORVASC) 10 MG tablet Place 1 tablet (10 mg total) into feeding tube daily. 04/16/21   Samella Parr, NP  atorvastatin (LIPITOR) 40 MG tablet Place 1 tablet (40 mg total) into feeding tube daily. 04/16/21   Samella Parr, NP  baclofen (OZOBAX) 1 mg/mL SOLN oral solution Place 5 mLs (5 mg total) into feeding tube at bedtime. 04/15/21   Samella Parr, NP  benzocaine (ORAJEL) 10 % mucosal gel Use as directed in the mouth or throat 4 (four) times daily as needed for mouth pain. 04/15/21   Samella Parr, NP  carvedilol (COREG) 25 MG tablet Place 1 tablet (25 mg total) into feeding tube 2 (two) times daily with a meal. 04/15/21   Samella Parr, NP  diclofenac Sodium (VOLTAREN) 1 % GEL Apply 4 g topically 4  (four) times daily. 04/15/21   Samella Parr, NP  doxazosin (CARDURA) 2 MG tablet Place 1 tablet (2 mg total) into feeding tube daily. 04/16/21   Samella Parr, NP  enoxaparin (LOVENOX) 40 MG/0.4ML injection Inject 0.4 mLs (40 mg total) into the skin daily. 04/15/21   Samella Parr, NP  feeding supplement (ENSURE ENLIVE / ENSURE PLUS) LIQD Place 237 mLs into feeding tube 3 (three) times daily between meals. 04/15/21   Samella Parr, NP  fluticasone (FLONASE) 50 MCG/ACT nasal spray Place 2 sprays into both nostrils daily. 04/16/21   Samella Parr, NP  hydrALAZINE (APRESOLINE) 25 MG tablet Place 1.5 tablets (37.5 mg total) into feeding tube every 8 (eight) hours. 04/15/21   Samella Parr, NP  magic mouthwash w/lidocaine SOLN Take 10 mLs by mouth 4 (four) times daily as needed for mouth pain. 04/15/21   Samella Parr, NP  modafinil (PROVIGIL) 200 MG tablet Place 1 tablet (200 mg total) into feeding tube daily. 04/16/21   Samella Parr, NP  Multiple Vitamin (MULTIVITAMIN WITH MINERALS) TABS tablet Place 1 tablet into feeding tube daily. 04/16/21   Samella Parr, NP  Nutritional Supplements (FEEDING SUPPLEMENT, JEVITY 1.5 CAL/FIBER,) LIQD Place 750 mLs into feeding tube daily. 04/15/21   Samella Parr, NP  Nutritional Supplements (FEEDING SUPPLEMENT,  PROSOURCE TF,) liquid Place 45 mLs into feeding tube daily. 04/15/21   Samella Parr, NP  pantoprazole sodium (PROTONIX) 40 mg Place 40 mg into feeding tube daily. 04/15/21   Samella Parr, NP  polyethylene glycol (MIRALAX / GLYCOLAX) 17 g packet Place 17 g into feeding tube daily as needed for moderate constipation. 04/15/21   Samella Parr, NP  traMADol (ULTRAM) 50 MG tablet Place 0.5 tablets (25 mg total) into feeding tube every 12 (twelve) hours as needed for moderate pain. 04/15/21   Samella Parr, NP  Water For Irrigation, Sterile (FREE WATER) SOLN Place 200 mLs into feeding tube every 4 (four) hours. 04/15/21   Samella Parr, NP  metoprolol tartrate (LOPRESSOR) 25 MG tablet Take 50 mg by mouth daily. 03/10/14 06/03/20  Lorayne Marek, MD      Allergies    Patient has no known allergies.    Review of Systems   Review of Systems  Physical Exam Updated Vital Signs BP (!) 143/89    Pulse 98    Temp 98 F (36.7 C) (Oral)    Resp 14    LMP 03/03/2014    SpO2 99%  Physical Exam Vitals and nursing note reviewed. Exam conducted with a chaperone present.  Constitutional:      Appearance: She is well-developed.  HENT:     Head: Normocephalic and atraumatic.  Cardiovascular:     Rate and Rhythm: Normal rate and regular rhythm.     Heart sounds: Normal heart sounds.  Pulmonary:     Effort: Pulmonary effort is normal.  Abdominal:     General: There is no distension.     Palpations: Abdomen is soft.     Comments: G-tube site with some mild discharge. Opening very narrow  Genitourinary:    Comments: There appears to be feculent material coming out of vagina Skin:    General: Skin is warm and dry.  Neurological:     Mental Status: She is alert.     Comments: Patient is nonverbal. Seems to track. Diffuse contractures    ED Results / Procedures / Treatments   Labs (all labs ordered are listed, but only abnormal results are displayed) Labs Reviewed  BASIC METABOLIC PANEL - Abnormal; Notable for the following components:      Result Value   Glucose, Bld 112 (*)    All other components within normal limits  CBC WITH DIFFERENTIAL/PLATELET - Abnormal; Notable for the following components:   RBC 3.79 (*)    Hemoglobin 9.5 (*)    HCT 30.3 (*)    MCV 79.9 (*)    MCH 25.1 (*)    All other components within normal limits  RESP PANEL BY RT-PCR (FLU A&B, COVID) [id number]    EKG EKG Interpretation  Date/Time:  Friday July 30 2021 10:33:29 EST Ventricular Rate:  85 PR Interval:  157 QRS Duration: 95 QT Interval:  348 QTC Calculation: 414 R Axis:   42 Text Interpretation: Sinus rhythm Abnormal R-wave  progression, early transition Anteroseptal infarct, old ST elevations similar to July 2022 Confirmed by Sherwood Gambler [phone]) on 07/30/2021 12:00:41 PM  Radiology CT ABDOMEN PELVIS W CONTRAST  Result Date: 07/30/2021 CLINICAL DATA:  Peg tube placement. Concern for rectovaginal fistula. EXAM: CT ABDOMEN AND PELVIS WITH CONTRAST TECHNIQUE: Multidetector CT imaging of the abdomen and pelvis was performed using the standard protocol following bolus administration of intravenous contrast. Attempt to administer rectal contrast. 100 cc administered. resistance encountered RADIATION DOSE REDUCTION:  This exam was performed according to the departmental dose-optimization program which includes automated exposure control, adjustment of the mA and/or kV according to patient size and/or use of iterative reconstruction technique. CONTRAST:  152mL OMNIPAQUE IOHEXOL 300 MG/ML SOLN, 77mL OMNIPAQUE IOHEXOL 300 MG/ML SOLN COMPARISON:  None. FINDINGS: Lower chest: Lung bases are clear. Hepatobiliary: No focal hepatic lesion. No biliary duct dilatation. Common bile duct is normal. Pancreas: Pancreas is normal. No ductal dilatation. No pancreatic inflammation. Spleen: Normal spleen Adrenals/urinary tract: Adrenal glands and kidneys are normal. The ureters and bladder normal. Stomach/Bowel: Stomach is normal in orientation. Small bowel normal. Oral contrast within ascending and transverse colon. Oral contrast in the descending colon. Oral contrast extends to the proximal rectum. Enema tip in  the distal rectum.  Minimal rectal contrast. There is no clear evidence of communication with the vagina. There is gas in the vagina however again no clear evidence of contrast in the vagina. Difficult to ascertain if there is small noncontrast vagina versus contrast enhancement from IV contrast. No clear evidence of communication with the vagina and rectum. Vascular/Lymphatic: Abdominal aorta is normal caliber. No periportal or retroperitoneal  adenopathy. No pelvic adenopathy. Reproductive: Uterus normal adnexa normal Other: No free fluid. Musculoskeletal: Contracted patient. Nonstandard position. Surgeon change of the hips. IMPRESSION: 1. No clear evidence of rectovaginal fistula. There is gas within the vagina which can be a normal finding. Small volume of rectal contrast was administered as difficult patient. 2. Stomach is normal in orientation. 3. No bowel obstruction. Electronically Signed   By: Suzy Bouchard M.D.   On: 07/30/2021 13:22    Procedures Procedures    Medications Ordered in ED Medications  iohexol (OMNIPAQUE) 300 MG/ML solution 100 mL (100 mLs Intravenous Contrast Given 07/30/21 1254)  iohexol (OMNIPAQUE) 300 MG/ML solution 25 mL (25 mLs Per Tube Contrast Given 07/30/21 1255)    ED Course/ Medical Decision Making/ A&P                           Medical Decision Making  Patient initially presents to the emergency department from [place] for evaluation of rectovaginal fistula.  The CT does not show an obvious fistula.  I have discussed with general surgery, Saverio Danker, who recommends that if there is no clear evidence, there are multiple paths she could take which could be just supportive care versus following up with colorectal surgeon as an outpatient.  There is either way no surgical indication for admission or operation.  Unfortunately, the patient's gastrostomy tube also came out prior to arrival here.  By the time she had arrived here it was noted that the hole was significantly closed and I tried to place a Foley catheter to keep stoma open but this was unsuccessful.  I discussed with interventional radiology who will take her this afternoon.  I have updated the patient's physician at [facility], Dr. Leana Gamer and we will send her back to [facility] after her G-tube placement.  Labs have been personally reviewed and interpreted and shows stable anemia.  Metabolic panel is unremarkable besides slight  hyperglycemia.   Care transferred to Dr. Sherry Ruffing.        Final Clinical Impression(s) / ED Diagnoses Final diagnoses:  None    Rx / DC Orders ED Discharge Orders     None         Sherwood Gambler, MD 07/30/21 1525

## 2021-07-30 NOTE — ED Triage Notes (Signed)
Pt from [facility] BIB EMS for stool coming from vagina and PEG tube dislodgement. Pt recently had a CT to r/o rectal-vaginal fistula, however they could not do imaging with contrast at facility. Pt sent here for PEG tube replacement and CT with contrast. Pt bed bound and contracted at baseline. Pt nonverbal at this time; unknown if baseline.

## 2021-07-30 NOTE — ED Provider Notes (Signed)
3:26 PM Care assumed from Dr. Criss Alvine.  At time of transfer care, patient is awaiting for IR tube replaced the PEG tube.  After this is completed, plan of care is to discharge home back to [facility] facility.  Previous team spoke with the [facility] provider who agreed with plan of care reportedly.  7:23 PM Patient's tube was replaced and the x-ray confirmed placement.  Per previous team discussion and IR note, patient is safe for discharge home with PCP follow-up.  Patient will be discharged and will likely need transport back to facility.   Clinical Impression: 1. Dislodged gastrostomy tube   2. Dysphagia, unspecified type     Disposition: Discharge  Condition: Good  I have discussed the results, Dx and Tx plan with the pt(& family if present). He/she/they expressed understanding and agree(s) with the plan. Discharge instructions discussed at great length. Strict return precautions discussed and pt &/or family have verbalized understanding of the instructions. No further questions at time of discharge.    New Prescriptions   No medications on file    Follow Up: [facility] [address] [place] [address] Schedule an appointment as soon as possible for a visit       Yaritza Leist, Canary Brim, MD 07/30/21 1924

## 2021-10-22 ENCOUNTER — Other Ambulatory Visit: Payer: Self-pay

## 2021-10-22 ENCOUNTER — Encounter (HOSPITAL_COMMUNITY): Payer: Self-pay

## 2021-10-22 ENCOUNTER — Emergency Department (HOSPITAL_COMMUNITY)
Admission: EM | Admit: 2021-10-22 | Discharge: 2021-10-24 | Disposition: A | Discharge status: Skilled Nursing Facility | Payer: Medicaid Other | Attending: Emergency Medicine | Admitting: Emergency Medicine

## 2021-10-22 DIAGNOSIS — Y833 Surgical operation with formation of external stoma as the cause of abnormal reaction of the patient, or of later complication, without mention of misadventure at the time of the procedure: Secondary | ICD-10-CM | POA: Diagnosis not present

## 2021-10-22 DIAGNOSIS — K9423 Gastrostomy malfunction: Secondary | ICD-10-CM | POA: Diagnosis not present

## 2021-10-22 DIAGNOSIS — T85528A Displacement of other gastrointestinal prosthetic devices, implants and grafts, initial encounter: Secondary | ICD-10-CM | POA: Diagnosis not present

## 2021-10-22 MED ORDER — FENTANYL CITRATE PF 50 MCG/ML IJ SOSY
50.0000 ug | PREFILLED_SYRINGE | INTRAMUSCULAR | Status: DC | PRN
  Filled 2021-10-22: qty 1

## 2021-10-22 MED ORDER — LACTATED RINGERS IV SOLN: INTRAVENOUS | Status: DC

## 2021-10-22 NOTE — ED Provider Notes (Signed)
?  Provider Note ?MRN:  [id number]  ?Arrival date & time: 10/22/21    ?ED Course and Medical Decision Making  ?Assumed care from Dr. Jacqulyn Bath at shift change. ? ?G-tube complication, question false track, to be evaluated by IR in the morning. ? ?Procedures ? ?Final Clinical Impressions(s) / ED Diagnoses  ? ?  ICD-10-CM   ?1. Dislodged gastrostomy tube  T85.528A   ?  ?  ?ED Discharge Orders   ? ? None  ? ?  ?  ?Discharge Instructions   ?None ?  ? ? ?Elmer Sow. Pilar Plate, MD ?[facility] Emergency Medicine ?[facility] [email or web address] ? ?  ?Sabas Sous, MD ?10/23/21 0725 ? ?

## 2021-10-22 NOTE — ED Triage Notes (Signed)
Pt gtube became displaced at [facility]. 91fr no other compaints ?

## 2021-10-22 NOTE — ED Provider Notes (Signed)
? ?  Emergency Department Provider Note ? ? ?I have reviewed the triage vital signs and the nursing notes. ? ? ?HISTORY ? ?Chief Complaint ?g-tube displaced (Pt is resident at [facility] and gtube came out.  No complaints) ? ? ?HPI ?Charlene [name] is a 56 y.o. female with past medical history reviewed below including prior CVA with G-tube for night feeding and medication presents with dislodged G-tube from [facility].  The 68 French G-tube was dislodged at some point this afternoon but staff is unsure exactly when.  The husband at bedside states that the staff attempted to replace the tube but could not and she was referred here for further evaluation.  ? ? ?Past Medical History:  ?Diagnosis Date  ? CVA (cerebral vascular accident) [facility])   ? Hypertension   ? ? ?Review of Systems ?Constitutional: No fever/chills ?Cardiovascular: Denies chest pain. ?Respiratory: Denies shortness of breath. ?Gastrointestinal: No abdominal pain.  No nausea, no vomiting.  No diarrhea.  No constipation. ? ? ?____________________________________________ ? ? ?PHYSICAL EXAM: ? ?VITAL SIGNS: ?ED Triage Vitals  ?Enc Vitals Group  ?   BP 10/22/21 2156 114/76  ?   Pulse Rate 10/22/21 2156 74  ?   Resp 10/22/21 2156 16  ?   Temp 10/22/21 2156 98.7 ?F (37.1 ?C)  ?   Temp Source 10/22/21 2156 Oral  ?   SpO2 10/22/21 2156 99 %  ? ?Constitutional: Alert. Well appearing and in no acute distress. ?Eyes: Conjunctivae are normal.  ?Head: Atraumatic. ?Nose: No congestion/rhinnorhea. ?Mouth/Throat: Mucous membranes are moist.   ?Neck: No stridor.   ?Cardiovascular: Normal rate, regular rhythm. Good peripheral circulation. Grossly normal heart sounds.   ?Respiratory: Normal respiratory effort.  No retractions. Lungs CTAB. ?Gastrointestinal: Soft and nontender. No distention.  ?Musculoskeletal: No gross deformities of extremities. ? ?____________________________________________ ? ? ?PROCEDURES ? ?Procedure(s) performed:   ? ?Procedures ? ?None ?____________________________________________ ? ? ?INITIAL IMPRESSION / ASSESSMENT AND PLAN / ED COURSE ? ?Pertinent labs & imaging results that were available during my care of the patient were reviewed by me and considered in my medical decision making (see chart for details). ? ?I did obtain Additional Historical Information from husband at bedside. ? ?I decided to review pertinent External Data, and in summary patient with similar incident in January 2023 requiring IR dilation of G tube tract. ? ? ?Consult complete with IR. Plan for team to evaluate in the ED tomorrow morning. Will attempt to replace at bedside and if they cannot will go to IR for placement.  ? ?Medical Decision Making: Summary:  ?Patient presents to the emergency department with dislodged G-tube.  I attempted to replace the tube at the bedside but I am able to advance only around 1 cm without an abrupt stop.  Discussed with patient and husband.  I have concerned that additional attempts may develop a false passage or cause pain/injury.  My bedside attempt was stopped and I spoke with IR.  Plan is for their team to evaluate in the morning. Will board in the ED overnight. Have ordered IVF and PRN pain medications.  ? ?Disposition: pending ? ?____________________________________________ ? ?FINAL CLINICAL IMPRESSION(S) / ED DIAGNOSES ? ?Final diagnoses:  ?Dislodged gastrostomy tube  ? ? ? ?Note:  This document was prepared using Dragon voice recognition software and may include unintentional dictation errors. ? ?Alona Bene, MD, FACEP ?Emergency Medicine ? ?  ?Maia Plan, MD ?10/23/21 1743 ? ?

## 2021-10-23 ENCOUNTER — Emergency Department (HOSPITAL_COMMUNITY): Payer: Medicaid Other

## 2021-10-23 HISTORY — PX: IR REPLC GASTRO/COLONIC TUBE PERCUT W/FLUORO: IMG2333

## 2021-10-23 IMAGING — XA IR REPLACE G-TUBE/COLONIC TUBE
1 series · 6 of 6 positions shown · non-contrast
Comparison: none

CLINICAL DATA: Inadvertent removal of long-term indwelling
gastrostomy catheter, last placed replaced [DATE]

EXAM:
GASTROSTOMY CATHETER REPLACEMENT
FLUOROSCOPY:
Radiation Exposure Index (as provided by the fluoroscopic device):
Less than 0.1 mGy air Kerma
TECHNIQUE: Previous gastrostomy site and surrounding skin prepped with
Betadine. A 5 French angiographic catheter easily advanced through
the tract. This was exchanged over a Glidewire for serial vascular
dilators which ultimately facilitated advancement of an 18 French
balloon-retention gastrostomy catheter over the wire into the lumen
of the stomach. The retention balloon was at inflated with 20 mL
sterile saline. The external bumper was applied. Small contrast
injection confirms good intraluminal flow into the decompressed
stomach. The patient tolerated the procedure well.
COMPLICATIONS:
COMPLICATIONS
none

[Series 1: fl (-) angio · 3 acquisitions, 6 frames shown]
[im 1/3]
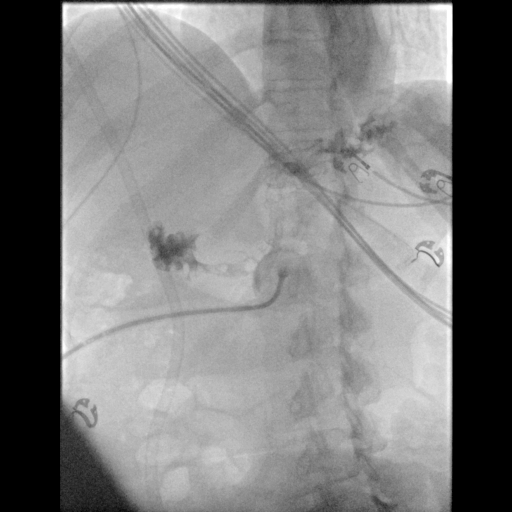
[im 1/3]
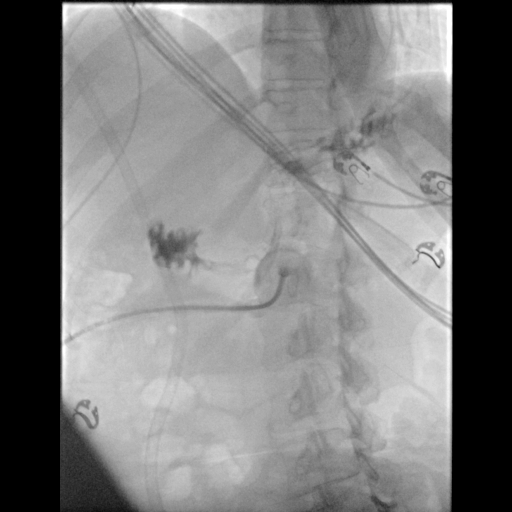
[im 1/3]
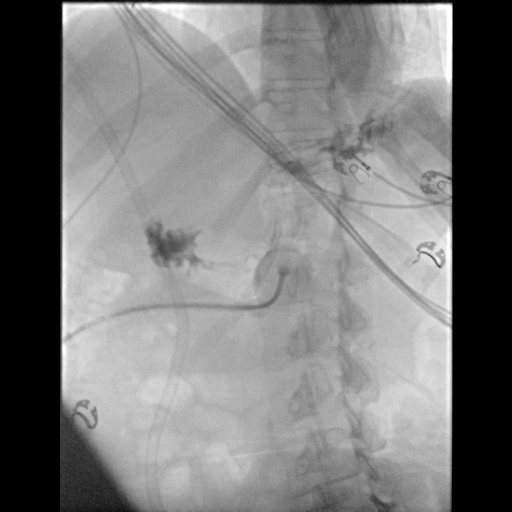
[im 1/3]
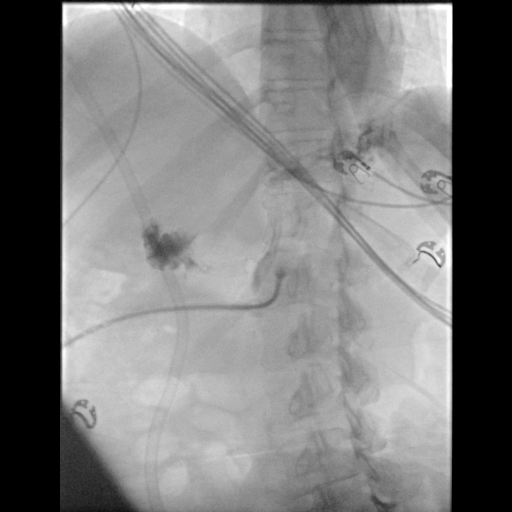
[im 2/3]
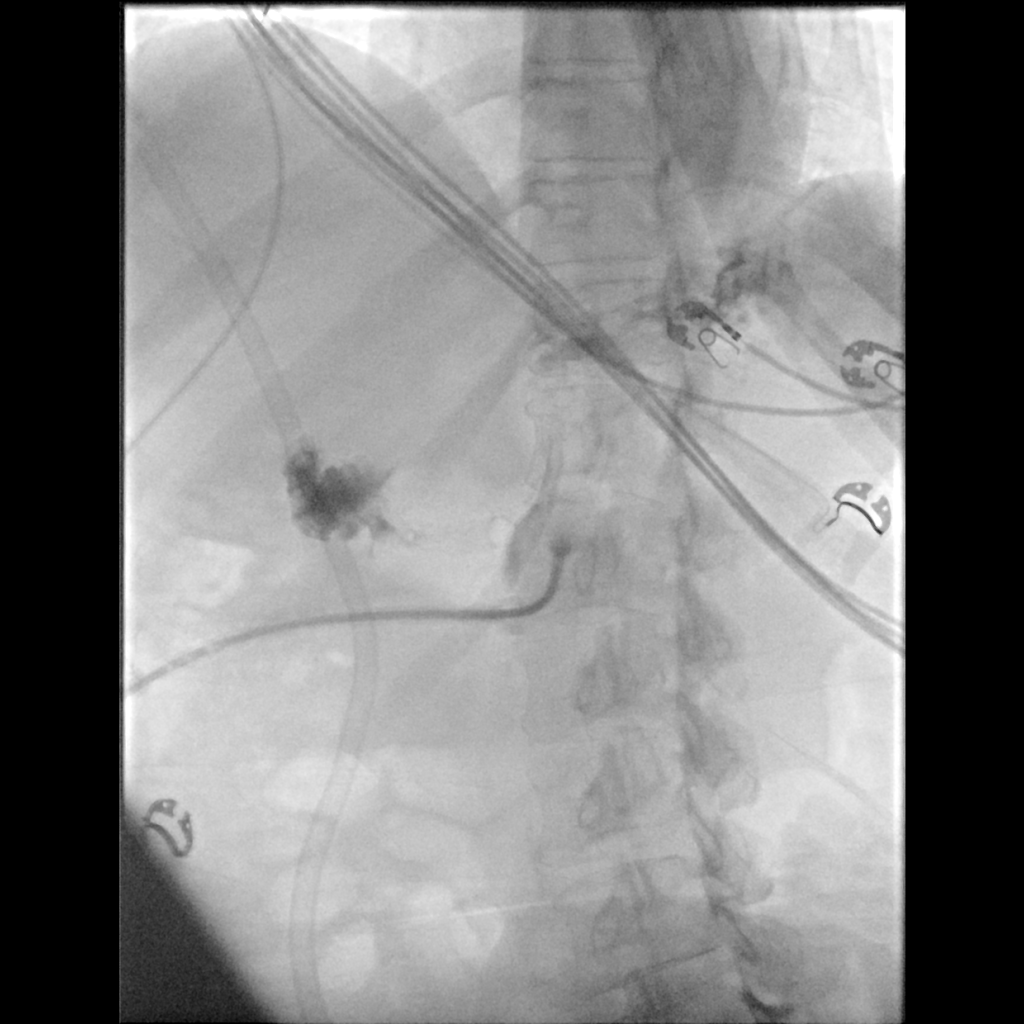
[im 3/3]
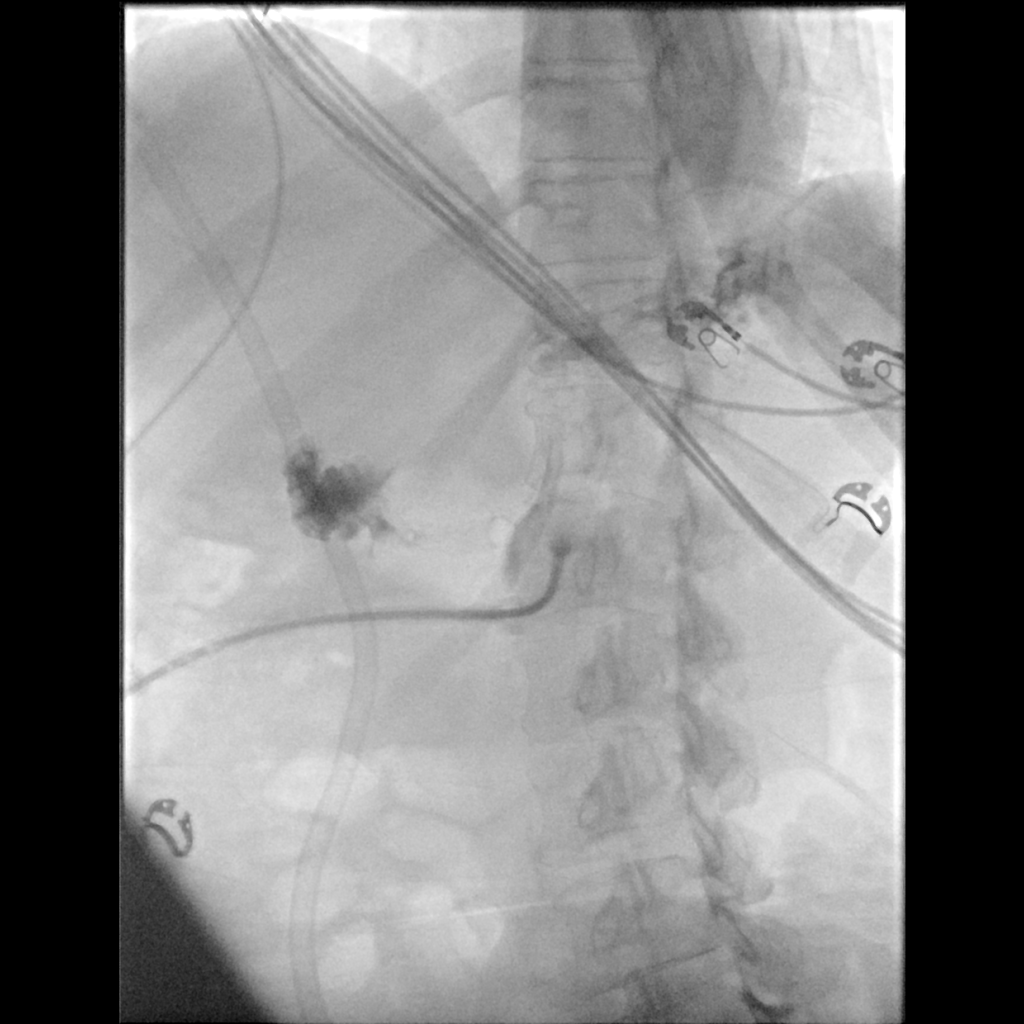

[6 of 6 positions shown; findings below may reference images not displayed]

IMPRESSION: 1. Technically successful 18 French gastrostomy catheter replacement
under fluoroscopy. Okay for routine use.

## 2021-10-23 MED ORDER — IOHEXOL 300 MG/ML  SOLN
100.0000 mL | Freq: Once | INTRAMUSCULAR | Status: AC | PRN
  Administered 2021-10-23: 10 mL

## 2021-10-23 MED ORDER — CARVEDILOL 3.125 MG PO TABS
25.0000 mg | ORAL_TABLET | Freq: Two times a day (BID) | ORAL | Status: DC
  Administered 2021-10-23: 25 mg
  Filled 2021-10-23: qty 2

## 2021-10-23 MED ORDER — HYDRALAZINE HCL 10 MG PO TABS
10.0000 mg | ORAL_TABLET | Freq: Once | ORAL | Status: AC
Start: 2021-10-23 — End: 2021-10-23
  Administered 2021-10-23: 10 mg via ORAL
  Filled 2021-10-23: qty 1

## 2021-10-23 MED ORDER — LIDOCAINE VISCOUS HCL 2 % MT SOLN
OROMUCOSAL | Status: AC
  Filled 2021-10-23: qty 15

## 2021-10-23 NOTE — ED Notes (Signed)
Husband called and updated that pt is being discharged and blood pressure meds were given. ?

## 2021-10-23 NOTE — ED Notes (Signed)
Pt is back in room from IR. ?

## 2021-10-23 NOTE — Progress Notes (Signed)
Unsuccessful attempt at bedside replacement of 18 Fr balloon retention gastrostomy tube. Cannot pass tip of G tube much past skin surface.  ? ?Plan for G tube replacement under fluoroscopy today in IR when time permits. Primary team aware. ? ?Lynnette Caffey, PA-C ?

## 2021-10-23 NOTE — ED Notes (Signed)
Husband at bedside. States pt is able to eat PO, they just use the G-tube for supplemental nutrition. ?

## 2021-10-23 NOTE — Procedures (Addendum)
?  Procedure:  Gastrostomy placement  18G Intuit via mature tract under fluoro ?Preprocedure diagnosis: The encounter diagnosis was Dislodged gastrostomy tube. ? ?Postprocedure diagnosis: same ?EBL:    minimal ?Complications:   none immediate ? ?See full dictation in [facility]. ? ?D. Oley Balm MD ?Main # [phone] ?Pager  [phone] ?Mobile [phone] ?  ? ?

## 2021-10-23 NOTE — ED Notes (Signed)
Pt husband requests to have no female staff for personal care. ?

## 2021-10-23 NOTE — ED Notes (Signed)
Pt tx to IR  ?

## 2021-10-23 NOTE — ED Notes (Signed)
[facility] called at 3:25; patient is 5th in line ?

## 2022-02-24 ENCOUNTER — Observation Stay (HOSPITAL_COMMUNITY): Payer: Medicaid Other

## 2022-02-24 ENCOUNTER — Emergency Department (HOSPITAL_COMMUNITY): Payer: Medicaid Other

## 2022-02-24 ENCOUNTER — Encounter (HOSPITAL_COMMUNITY): Payer: Self-pay | Admitting: Internal Medicine

## 2022-02-24 ENCOUNTER — Inpatient Hospital Stay (HOSPITAL_COMMUNITY)
Admission: EM | Admit: 2022-02-24 | Discharge: 2022-02-27 | DRG: 101 | Disposition: A | Discharge status: Skilled Nursing Facility | Payer: Medicaid Other | Source: Skilled Nursing Facility | Attending: Internal Medicine | Admitting: Internal Medicine

## 2022-02-24 DIAGNOSIS — R569 Unspecified convulsions: Secondary | ICD-10-CM

## 2022-02-24 DIAGNOSIS — Z930 Tracheostomy status: Secondary | ICD-10-CM

## 2022-02-24 DIAGNOSIS — R339 Retention of urine, unspecified: Secondary | ICD-10-CM | POA: Diagnosis present

## 2022-02-24 DIAGNOSIS — I10 Essential (primary) hypertension: Secondary | ICD-10-CM | POA: Diagnosis not present

## 2022-02-24 DIAGNOSIS — G9341 Metabolic encephalopathy: Secondary | ICD-10-CM | POA: Diagnosis present

## 2022-02-24 DIAGNOSIS — F419 Anxiety disorder, unspecified: Secondary | ICD-10-CM | POA: Diagnosis present

## 2022-02-24 DIAGNOSIS — Z79899 Other long term (current) drug therapy: Secondary | ICD-10-CM

## 2022-02-24 DIAGNOSIS — G4089 Other seizures: Principal | ICD-10-CM | POA: Diagnosis present

## 2022-02-24 DIAGNOSIS — Z20822 Contact with and (suspected) exposure to covid-19: Secondary | ICD-10-CM | POA: Diagnosis present

## 2022-02-24 DIAGNOSIS — E785 Hyperlipidemia, unspecified: Secondary | ICD-10-CM | POA: Diagnosis present

## 2022-02-24 DIAGNOSIS — F32A Depression, unspecified: Secondary | ICD-10-CM | POA: Diagnosis present

## 2022-02-24 DIAGNOSIS — I69154 Hemiplegia and hemiparesis following nontraumatic intracerebral hemorrhage affecting left non-dominant side: Secondary | ICD-10-CM

## 2022-02-24 DIAGNOSIS — Z931 Gastrostomy status: Secondary | ICD-10-CM

## 2022-02-24 DIAGNOSIS — Z8249 Family history of ischemic heart disease and other diseases of the circulatory system: Secondary | ICD-10-CM

## 2022-02-24 DIAGNOSIS — Z87898 Personal history of other specified conditions: Secondary | ICD-10-CM

## 2022-02-24 DIAGNOSIS — I69198 Other sequelae of nontraumatic intracerebral hemorrhage: Secondary | ICD-10-CM

## 2022-02-24 HISTORY — DX: Other infective otitis externa, bilateral: H60.393

## 2022-02-24 HISTORY — DX: Other acute nonsuppurative otitis media, right ear: H65.191

## 2022-02-24 HISTORY — DX: Acute respiratory failure with hypoxia: J96.01

## 2022-02-24 HISTORY — DX: Altered mental status, unspecified: R41.82

## 2022-02-24 HISTORY — DX: Tracheostomy status: Z93.0

## 2022-02-24 HISTORY — DX: Acute cystitis without hematuria: N30.00

## 2022-02-24 LAB — COMPREHENSIVE METABOLIC PANEL
ALT: 17 U/L (ref 0–44)
AST: 16 U/L (ref 15–41)
Albumin: 3.1 g/dL — ABNORMAL LOW (ref 3.5–5.0)
Alkaline Phosphatase: 66 U/L (ref 38–126)
Anion gap: 7 (ref 5–15)
BUN: 20 mg/dL (ref 6–20)
CO2: 26 mmol/L (ref 22–32)
Calcium: 9 mg/dL (ref 8.9–10.3)
Chloride: 107 mmol/L (ref 98–111)
Creatinine, Ser: 0.51 mg/dL (ref 0.44–1.00)
GFR, Estimated: >60 mL/min (ref >60)
Glucose, Bld: 96 mg/dL (ref 70–99)
Potassium: 3.6 mmol/L (ref 3.5–5.1)
Sodium: 140 mmol/L (ref 135–145)
Total Bilirubin: 0.3 mg/dL (ref 0.3–1.2)
Total Protein: 6.3 g/dL — ABNORMAL LOW (ref 6.5–8.1)

## 2022-02-24 LAB — APTT: aPTT: 29 seconds (ref 24–36)

## 2022-02-24 LAB — I-STAT CHEM 8, ED
BUN: 21 mg/dL — ABNORMAL HIGH (ref 6–20)
Calcium, Ion: 1.18 mmol/L (ref 1.15–1.40)
Chloride: 104 mmol/L (ref 98–111)
Creatinine, Ser: 0.5 mg/dL (ref 0.44–1.00)
Glucose, Bld: 89 mg/dL (ref 70–99)
HCT: 29 % — ABNORMAL LOW (ref 36.0–46.0)
Hemoglobin: 9.9 g/dL — ABNORMAL LOW (ref 12.0–15.0)
Potassium: 3.5 mmol/L (ref 3.5–5.1)
Sodium: 141 mmol/L (ref 135–145)
TCO2: 26 mmol/L (ref 22–32)

## 2022-02-24 LAB — TROPONIN I (HIGH SENSITIVITY)
Troponin I (High Sensitivity): 4 ng/L (ref <18)
Troponin I (High Sensitivity): 3 ng/L (ref <18)

## 2022-02-24 LAB — DIFFERENTIAL
Abs Immature Granulocytes: 0.02 10*3/uL (ref 0.00–0.07)
Basophils Absolute: 0 10*3/uL (ref 0.0–0.1)
Basophils Relative: 0 %
Eosinophils Absolute: 0 10*3/uL (ref 0.0–0.5)
Eosinophils Relative: 1 %
Immature Granulocytes: 0 %
Lymphocytes Relative: 36 %
Lymphs Abs: 2.5 10*3/uL (ref 0.7–4.0)
Monocytes Absolute: 0.4 10*3/uL (ref 0.1–1.0)
Monocytes Relative: 6 %
Neutro Abs: 4 10*3/uL (ref 1.7–7.7)
Neutrophils Relative %: 57 %

## 2022-02-24 LAB — CBC
HCT: 31.6 % — ABNORMAL LOW (ref 36.0–46.0)
Hemoglobin: 9.8 g/dL — ABNORMAL LOW (ref 12.0–15.0)
MCH: 25.1 pg — ABNORMAL LOW (ref 26.0–34.0)
MCHC: 31 g/dL (ref 30.0–36.0)
MCV: 80.8 fL (ref 80.0–100.0)
Platelets: 228 10*3/uL (ref 150–400)
RBC: 3.91 MIL/uL (ref 3.87–5.11)
RDW: 14 % (ref 11.5–15.5)
WBC: 7 10*3/uL (ref 4.0–10.5)
nRBC: 0 % (ref 0.0–0.2)

## 2022-02-24 LAB — ETHANOL: Alcohol, Ethyl (B): <10 mg/dL (ref <10)

## 2022-02-24 LAB — RESP PANEL BY RT-PCR (FLU A&B, COVID) ARPGX2
Influenza A by PCR: NEGATIVE
Influenza B by PCR: NEGATIVE
SARS Coronavirus 2 by RT PCR: NEGATIVE

## 2022-02-24 LAB — I-STAT BETA HCG BLOOD, ED (MC, WL, AP ONLY): I-stat hCG, quantitative: <5 m[IU]/mL (ref <5)

## 2022-02-24 LAB — PROTIME-INR
INR: 1.1 (ref 0.8–1.2)
Prothrombin Time: 14.5 seconds (ref 11.4–15.2)

## 2022-02-24 MED ORDER — LEVETIRACETAM IN NACL 500 MG/100ML IV SOLN
500.0000 mg | Freq: Two times a day (BID) | INTRAVENOUS | Status: DC
  Administered 2022-02-25 – 2022-02-27 (×5): 500 mg via INTRAVENOUS
  Filled 2022-02-24 (×6): qty 100

## 2022-02-24 MED ORDER — ADULT MULTIVITAMIN W/MINERALS CH
1.0000 | ORAL_TABLET | Freq: Every day | ORAL | Status: DC
Start: 2022-02-25 — End: 2022-02-27
  Administered 2022-02-25 – 2022-02-27 (×3): 1
  Filled 2022-02-24 (×3): qty 1

## 2022-02-24 MED ORDER — AMLODIPINE BESYLATE 10 MG PO TABS
10.0000 mg | ORAL_TABLET | Freq: Every day | ORAL | Status: DC
Start: 2022-02-25 — End: 2022-02-27
  Administered 2022-02-25 – 2022-02-27 (×3): 10 mg
  Filled 2022-02-24: qty 2
  Filled 2022-02-24 (×2): qty 1

## 2022-02-24 MED ORDER — LORAZEPAM 2 MG/ML IJ SOLN
1.0000 mg | Freq: Once | INTRAMUSCULAR | Status: AC
  Administered 2022-02-24: 1 mg via INTRAVENOUS
  Filled 2022-02-24: qty 1

## 2022-02-24 MED ORDER — FREE WATER
200.0000 mL | Status: DC
Start: 2022-02-25 — End: 2022-02-27
  Administered 2022-02-24 – 2022-02-27 (×16): 200 mL

## 2022-02-24 MED ORDER — ORAL CARE MOUTH RINSE
15.0000 mL | OROMUCOSAL | Status: DC
  Administered 2022-02-26 (×4): 15 mL via OROMUCOSAL

## 2022-02-24 MED ORDER — ENOXAPARIN SODIUM 40 MG/0.4ML IJ SOSY
40.0000 mg | PREFILLED_SYRINGE | INTRAMUSCULAR | Status: DC
Start: 2022-02-25 — End: 2022-02-27
  Administered 2022-02-25 – 2022-02-27 (×3): 40 mg via SUBCUTANEOUS
  Filled 2022-02-24 (×3): qty 0.4

## 2022-02-24 MED ORDER — MODAFINIL 100 MG PO TABS: 200.0000 mg | ORAL_TABLET | Freq: Every day | ORAL | Status: DC

## 2022-02-24 MED ORDER — ACETAMINOPHEN 325 MG PO TABS
650.0000 mg | ORAL_TABLET | Freq: Four times a day (QID) | ORAL | Status: DC | PRN
  Administered 2022-02-27: 650 mg via ORAL
  Filled 2022-02-24: qty 2

## 2022-02-24 MED ORDER — ENSURE ENLIVE PO LIQD
237.0000 mL | Freq: Three times a day (TID) | ORAL | Status: DC
Start: 2022-02-25 — End: 2022-02-25
  Administered 2022-02-25 (×2): 237 mL
  Filled 2022-02-24 (×2): qty 237

## 2022-02-24 MED ORDER — DOXAZOSIN MESYLATE 2 MG PO TABS
2.0000 mg | ORAL_TABLET | Freq: Every day | ORAL | Status: DC
  Administered 2022-02-26 – 2022-02-27 (×2): 2 mg
  Filled 2022-02-24 (×4): qty 1

## 2022-02-24 MED ORDER — BACLOFEN 10 MG PO TABS
5.0000 mg | ORAL_TABLET | Freq: Every day | ORAL | Status: DC
  Administered 2022-02-24 – 2022-02-26 (×3): 5 mg
  Filled 2022-02-24 (×3): qty 1

## 2022-02-24 MED ORDER — SODIUM CHLORIDE 0.9% FLUSH
3.0000 mL | Freq: Two times a day (BID) | INTRAVENOUS | Status: DC
  Administered 2022-02-24 – 2022-02-27 (×6): 3 mL via INTRAVENOUS

## 2022-02-24 MED ORDER — PANTOPRAZOLE 2 MG/ML SUSPENSION
40.0000 mg | Freq: Every day | ORAL | Status: DC
  Administered 2022-02-25 – 2022-02-27 (×3): 40 mg
  Filled 2022-02-24 (×4): qty 20

## 2022-02-24 MED ORDER — ATORVASTATIN CALCIUM 40 MG PO TABS
40.0000 mg | ORAL_TABLET | Freq: Every day | ORAL | Status: DC
  Administered 2022-02-25 – 2022-02-27 (×3): 40 mg
  Filled 2022-02-24 (×3): qty 1

## 2022-02-24 MED ORDER — CHLORHEXIDINE GLUCONATE 0.12% ORAL RINSE (MEDLINE KIT)
15.0000 mL | Freq: Two times a day (BID) | OROMUCOSAL | Status: DC
Start: 2022-02-24 — End: 2022-02-26
  Administered 2022-02-26: 15 mL via OROMUCOSAL

## 2022-02-24 MED ORDER — POLYETHYLENE GLYCOL 3350 17 G PO PACK: 17.0000 g | PACK | Freq: Every day | ORAL | Status: DC | PRN

## 2022-02-24 MED ORDER — CARVEDILOL 12.5 MG PO TABS
25.0000 mg | ORAL_TABLET | Freq: Two times a day (BID) | ORAL | Status: DC
Start: 2022-02-25 — End: 2022-02-25
  Administered 2022-02-25: 25 mg
  Filled 2022-02-24: qty 2

## 2022-02-24 MED ORDER — LEVETIRACETAM IN NACL 1000 MG/100ML IV SOLN
1000.0000 mg | INTRAVENOUS | Status: AC
  Administered 2022-02-24: 1000 mg via INTRAVENOUS
  Filled 2022-02-24: qty 100

## 2022-02-24 MED ORDER — MAGIC MOUTHWASH: 10.0000 mL | Freq: Four times a day (QID) | ORAL | Status: DC | PRN

## 2022-02-24 MED ORDER — ACETAMINOPHEN 650 MG RE SUPP
650.0000 mg | Freq: Four times a day (QID) | RECTAL | Status: DC | PRN
  Administered 2022-02-24 – 2022-02-25 (×2): 650 mg via RECTAL
  Filled 2022-02-24 (×2): qty 1

## 2022-02-24 MED ORDER — LORAZEPAM 2 MG/ML IJ SOLN: 2.0000 mg | INTRAMUSCULAR | Status: DC | PRN

## 2022-02-24 NOTE — Procedures (Addendum)
Patient Name: Charlene [name]  MRN: [id number]  Epilepsy Attending: Charlsie Quest  Referring Physician/Provider: Milon Dikes, MD  Date: 02/24/2022 Duration: 29.04 mins  Patient history: 56 year old with prior right thalamic ICH, IVH with ensuing left-sided spastic hemiparesis, resident of a SNF with a poor modified Rankin score at baseline, presenting with what appears to be new onset seizure. EEG to evaluate for seizure  Level of alertness: Awake, asleep  AEDs during EEG study: LEV  Technical aspects: This EEG study was done with scalp electrodes positioned according to the 10-20 International system of electrode placement. Electrical activity was reviewed with band pass filter of 1-70Hz , sensitivity of 7 uV/mm, display speed of 9mm/sec with a 60Hz  notched filter applied as appropriate. EEG data were recorded continuously and digitally stored.  Video monitoring was available and reviewed as appropriate.  Description: The posterior dominant rhythm consists of 9 Hz activity of moderate voltage (25-35 uV) seen predominantly in posterior head regions, symmetric and reactive to eye opening and eye closing.  Sleep was characterized by vertex waves, sleep spindles (12 to 14 Hz), maximal frontocentral region.  Hyperventilation and photic stimulation were not performed.     IMPRESSION: This study is within normal limits. No seizures or epileptiform discharges were seen throughout the recording.  A normal interictal EEG does not exclude nor support the diagnosis of epilepsy.   Sylas Twombly 

## 2022-02-24 NOTE — Consult Note (Addendum)
Neurology Consultation  Reason for Consult: Altered mental status Referring Physician: Dr. Chaney Malling  CC: Altered mental status, concern for seizure  History is obtained from: Chart, patient's husband at bedside  HPI: Charlene [name] is a 56 y.o. 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  Reportedly had some nausea and diarrhea yesterday and since then has been more altered.  At baseline she is nonverbal and has left-sided weakness from the prior intracerebral hemorrhage but became completely unresponsive.  There was some question of rightward gaze deviation that was forced as well as lipsmacking.  She was given 1 dose of benzodiazepines which broke the gaze and the lipsmacking movements but she remained drowsy and lethargic. Husband later came at bedside.  He reports that the best mental status that she has had over the past few months is where she is awake, she is able to follow conversations as far as he can tell, she mumbles some words and is able to write things more clearly off-and-on. She has trickle movement in the left upper extremity and increased tone in bilateral lower extremities for which she gets physical therapy. Husband also reports dental extractions a few weeks ago after which she was given tramadol and had been constipated after.   LKW: Yesterday sometime-unclear IV thrombolysis given?: no, prior history of ICH, unclear last known well Premorbid modified Rankin scale (mRS): 5  ROS: Full ROS was performed and is negative except as noted in the HPI.  Past Medical History:  Diagnosis Date   CVA (cerebral vascular accident) (HCC)    Hypertension      Family History  Problem Relation Age of Onset   Hypertension Father    Cancer Maternal Grandmother    Thyroid disease Paternal Uncle      Social  History:   reports that she has never smoked. She has never used smokeless tobacco. She reports that she does not drink alcohol and does not use drugs.  Medications No current facility-administered medications for this encounter.  Current Outpatient Medications:    acetaminophen (TYLENOL) 325 MG tablet, Place 2 tablets (650 mg total) into feeding tube every 6 (six) hours., Disp: , Rfl:    amLODipine (NORVASC) 10 MG tablet, Place 1 tablet (10 mg total) into feeding tube daily., Disp: , Rfl:    atorvastatin (LIPITOR) 40 MG tablet, Place 1 tablet (40 mg total) into feeding tube daily., Disp: , Rfl:    baclofen (OZOBAX) 1 mg/mL SOLN oral solution, Place 5 mLs (5 mg total) into feeding tube at bedtime., Disp: , Rfl:    benzocaine (ORAJEL) 10 % mucosal gel, Use as directed in the mouth or throat 4 (four) times daily as needed for mouth pain., Disp: 5.3 g, Rfl: 0   carvedilol (COREG) 25 MG tablet, Place 1 tablet (25 mg total) into feeding tube 2 (two) times daily with a meal., Disp: , Rfl:    diclofenac Sodium (VOLTAREN) 1 % GEL, Apply 4 g topically 4 (four) times daily., Disp: , Rfl:    doxazosin (CARDURA) 2 MG tablet, Place 1 tablet (2 mg total) into feeding tube daily., Disp: , Rfl:    enoxaparin (LOVENOX) 40 MG/0.4ML injection, Inject 0.4 mLs (40 mg total) into the skin daily., Disp: 0 mL, Rfl:    feeding supplement (ENSURE ENLIVE / ENSURE PLUS) LIQD, Place 237 mLs into feeding tube 3 (  three) times daily between meals., Disp: 237 mL, Rfl: 12   fluticasone (FLONASE) 50 MCG/ACT nasal spray, Place 2 sprays into both nostrils daily., Disp: , Rfl: 2   hydrALAZINE (APRESOLINE) 25 MG tablet, Place 1.5 tablets (37.5 mg total) into feeding tube every 8 (eight) hours., Disp: , Rfl:    magic mouthwash w/lidocaine SOLN, Take 10 mLs by mouth 4 (four) times daily as needed for mouth pain., Disp: , Rfl: 0   modafinil (PROVIGIL) 200 MG tablet, Place 1 tablet (200 mg total) into feeding tube daily., Disp: , Rfl:     Multiple Vitamin (MULTIVITAMIN WITH MINERALS) TABS tablet, Place 1 tablet into feeding tube daily., Disp: , Rfl:    Nutritional Supplements (FEEDING SUPPLEMENT, JEVITY 1.5 CAL/FIBER,) LIQD, Place 750 mLs into feeding tube daily., Disp: , Rfl:    Nutritional Supplements (FEEDING SUPPLEMENT, PROSOURCE TF,) liquid, Place 45 mLs into feeding tube daily., Disp: , Rfl:    pantoprazole sodium (PROTONIX) 40 mg, Place 40 mg into feeding tube daily., Disp: , Rfl:    polyethylene glycol (MIRALAX / GLYCOLAX) 17 g packet, Place 17 g into feeding tube daily as needed for moderate constipation., Disp: 14 each, Rfl: 0   traMADol (ULTRAM) 50 MG tablet, Place 0.5 tablets (25 mg total) into feeding tube every 12 (twelve) hours as needed for moderate pain., Disp: 30 tablet, Rfl:    Water For Irrigation, Sterile (FREE WATER) SOLN, Place 200 mLs into feeding tube every 4 (four) hours., Disp: , Rfl:    Exam: Current vital signs: BP 102/72   Pulse 86   Temp (!) 97.1 F (36.2 C) (Rectal)   Resp 16   LMP 03/03/2014   SpO2 99%  Vital signs in last 24 hours: Temp:  [97.1 F (36.2 C)] 97.1 F (36.2 C) (08/10 1953) Pulse Rate:  [86-96] 86 (08/10 2115) Resp:  [14-17] 16 (08/10 2115) BP: (98-113)/(72-81) 102/72 (08/10 2115) SpO2:  [99 %-100 %] 99 % (08/10 2115) General: Drowsy, opens eyes to loud voice, does not follow commands HEENT: Normocephalic atraumatic CVS: Regular rhythm Respiratory: Breathing well saturating normally on room air Abdomen nondistended nontender Extremities: Diminished overall muscle mass and increased tone in the left upper extremity and bilateral lower extremities. Neurological exam Drowsy, opens eyes to voice Does not follow commands Nonverbal Cranial nerves: Pupils are equal round react light, slight rightward gaze preference but able to look on both sides, does not blink to threat consistently from either side, subtle left lower facial weakness. Motor examination with severely  spastic left upper extremity with minimal movement to noxious stimulation.  Increased tone in the left lower extremity with somewhat more movement compared to the arm to noxious stimulation.  Right upper extremity she is able to hold antigravity.  Right lower extremity also has increased tone with extended/plantarflexed toes with increased tone. Sensation: As above  Coordination cannot be tested due to her mentation Gait testing: Nonambulatory and bedbound at baseline  Labs I have reviewed labs in epic and the results pertinent to this consultation are:  CBC    Component Value Date/Time   WBC 7.0 02/24/2022 1954   RBC 3.91 02/24/2022 1954   HGB 9.9 (L) 02/24/2022 2008   HCT 29.0 (L) 02/24/2022 2008   PLT 228 02/24/2022 1954   MCV 80.8 02/24/2022 1954   MCH 25.1 (L) 02/24/2022 1954   MCHC 31.0 02/24/2022 1954   RDW 14.0 02/24/2022 1954   LYMPHSABS 2.5 02/24/2022 1954   MONOABS 0.4 02/24/2022 1954   EOSABS  0.0 02/24/2022 1954   BASOSABS 0.0 02/24/2022 1954    CMP     Component Value Date/Time   NA 141 02/24/2022 2008   K 3.5 02/24/2022 2008   CL 104 02/24/2022 2008   CO2 26 02/24/2022 1954   GLUCOSE 89 02/24/2022 2008   BUN 21 (H) 02/24/2022 2008   CREATININE 0.50 02/24/2022 2008   CALCIUM 9.0 02/24/2022 1954   PROT 6.3 (L) 02/24/2022 1954   ALBUMIN 3.1 (L) 02/24/2022 1954   AST 16 02/24/2022 1954   ALT 17 02/24/2022 1954   ALKPHOS 66 02/24/2022 1954   BILITOT 0.3 02/24/2022 1954   GFRNONAA >60 02/24/2022 1954   GFRAA >90 02/21/2014 1218    Lipid Panel     Component Value Date/Time   CHOL 265 (H) 08/27/2020 0251   TRIG 119 09/03/2020 0353   HDL 72 08/27/2020 0251   CHOLHDL 3.7 08/27/2020 0251   VLDL 26 08/27/2020 0251   LDLCALC 167 (H) 08/27/2020 0251     Imaging I have reviewed the images obtained:  CT-head encephalomalacia in the right basal ganglia thalamic capsular region.  No new findings   Assessment: 56 year old with prior right thalamic ICH, IVH  with ensuing left-sided spastic hemiparesis, resident of a SNF with a poor modified Rankin score at baseline, presenting with what appears to be new onset seizure. Likely related to the prior ICH/IVH.  Recommendations: Admit to hospitalist-stepdown/progressive floor Check labs to look for any provoking factors that might have lowered seizure threshold-check UA, chest x-ray. Load with Keppra 1000 mg IV x 1. Start Keppra 500 twice daily Seizure precautions Stat EEG followed by LTM EEG since does not appear to be back to baseline. MRI brain without contrast after LTM is completed Neurology will follow Plan discussed with Dr. Darl Householder attending and Dr. Alessandra Bevels   -- Amie Portland, MD Neurologist [facility] Pager: [phone]

## 2022-02-24 NOTE — ED Notes (Signed)
Pt noted to be tachycardiac from 110-130. EKG obtained. Pt afebrile at 97.5. Pt noted to be grimacing and reaching for knee. Per husband pt does this when she is in pain from her knee. Pt medicated for pain and repositioned with pillows.

## 2022-02-24 NOTE — H&P (Addendum)
History and Physical   ADILEN PAVELKO CBS:[id number] DOB: [birth year] DOA: 02/24/2022  PCP: Patient, [name]  Patient coming from: Home  Chief Complaint: AMS  HPI: Charlene [name] is a 56 y.o. female with medical history significant of hypertension, anxiety, depression, hyperlipidemia, significant intracerebral hemorrhage with G-tube and urinary retention presenting with altered mental status.  History obtained assistance of family and chart review.  Patient has decreased baseline where she is largely nonverbal but does track with eyes and can write to communicate and interacts and can feed herself with a spoon for a while Per family and chart review.  Reports today that she was noted to have some nausea vomiting yesterday and then today at facility staff noted she appeared to be altered.  EMS was called and she was not following commands and had rightward gaze preference and there was also some concern for some ST elevation on her EEG.  Also noted to have lipsmacking/teeth grinding behavior.  Previously this was thought to be due to dental issues however she had had recent teeth extractions surprised that she was still grinding her teeth or smacking her lips.  Received some benzos in the ED with improvement in her gaze and her lipsmacking.  Unable to obtain full review of systems due to patient's altered mentation.  ED Course: Vital signs in ED notable for temperature of 97.1.  Lab workup included CMP with protein 36.3 and albumin 3.1.  CBC with hemoglobin stable at 9.8.  PT, PTT, INR within normal limits.  Troponin normal first check with repeat pending.  Respiratory panel flu COVID-negative.  Urinalysis, UDS, ethanol pending.  Chest x-ray showed mild pulmonary vascular congestion with stable scarring.  CT head showed no acute abnormality but did demonstrate known chronic changes.  Patient received Ativan, Keppra, started on daily Keppra as well in the ED.  Neurology was consulted recommending  above Keppra, EEG, MRI.  Review of Systems: Unable obtain full review of systems due to patient's altered mentation.  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   Hypertension    Infective otitis externa of both ears    Middle ear effusion, bilateral 04/14/2021   Nausea & vomiting 02/21/2014   Tracheostomy dependent [facility])     Past Surgical History:  Procedure Laterality Date   CESAREAN SECTION     ESOPHAGOGASTRODUODENOSCOPY N/A 09/10/2020   Procedure: ESOPHAGOGASTRODUODENOSCOPY (EGD);  Surgeon: Kinsinger, De Blanch, MD;  Location: [facility] ENDOSCOPY;  Service: General;  Laterality: N/A;   IR REPLACE G-TUBE SIMPLE WO FLUORO  07/30/2021   IR REPLC GASTRO/COLONIC TUBE PERCUT W/FLUORO  02/16/2021   IR REPLC GASTRO/COLONIC TUBE PERCUT W/FLUORO  10/23/2021   PEG PLACEMENT N/A 09/10/2020   Procedure: PERCUTANEOUS ENDOSCOPIC GASTROSTOMY (PEG) PLACEMENT;  Surgeon: Rodman Pickle, MD;  Location: [facility] ENDOSCOPY;  Service: General;  Laterality: N/A;    Social History  reports that she has never smoked. She has never used smokeless tobacco. She reports that she does not drink alcohol and does not use drugs.  No Known Allergies  Family History  Problem Relation Age of Onset   Hypertension Father    Cancer Maternal Grandmother    Thyroid disease Paternal Uncle  Reviewed on admission  Prior to Admission medications   Medication Sig Start Date End Date Taking? Authorizing Provider  acetaminophen (TYLENOL) 325 MG tablet Place 2 tablets (650 mg total) into feeding tube every 6 (six) hours. 04/15/21   Samella Parr, NP  amLODipine (NORVASC) 10 MG tablet Place 1 tablet (10 mg total) into feeding tube daily. 04/16/21   Samella Parr, NP  atorvastatin (LIPITOR) 40 MG tablet Place 1 tablet (40 mg total) into feeding tube daily. 04/16/21   Samella Parr, NP  baclofen (OZOBAX) 1 mg/mL SOLN oral solution Place 5 mLs (5 mg total) into feeding tube at bedtime. 04/15/21   Samella Parr, NP  benzocaine (ORAJEL) 10 % mucosal gel Use as directed in the mouth or throat 4 (four) times daily as needed for mouth pain. 04/15/21   Samella Parr, NP  carvedilol (COREG) 25 MG tablet Place 1 tablet (25 mg total) into feeding tube 2 (two) times daily with a meal. 04/15/21   Samella Parr, NP  diclofenac Sodium (VOLTAREN) 1 % GEL Apply 4 g topically 4 (four) times daily. 04/15/21   Samella Parr, NP  doxazosin (CARDURA) 2 MG tablet Place 1 tablet (2 mg total) into feeding tube daily. 04/16/21   Samella Parr, NP  enoxaparin (LOVENOX) 40 MG/0.4ML injection Inject 0.4 mLs (40 mg total) into the skin daily. 04/15/21   Samella Parr, NP  feeding supplement (ENSURE ENLIVE / ENSURE PLUS) LIQD Place 237 mLs into feeding tube 3 (three) times daily between meals. 04/15/21   Samella Parr, NP  fluticasone (FLONASE) 50 MCG/ACT nasal spray Place 2 sprays into both nostrils daily. 04/16/21   Samella Parr, NP  hydrALAZINE (APRESOLINE) 25 MG tablet Place 1.5 tablets (37.5 mg total) into feeding tube every 8 (eight) hours. 04/15/21   Samella Parr, NP  magic mouthwash w/lidocaine SOLN Take 10 mLs by mouth 4 (four) times daily as needed for mouth pain. 04/15/21   Samella Parr, NP  modafinil (PROVIGIL) 200 MG tablet Place 1 tablet (200 mg total) into feeding tube daily. 04/16/21   Samella Parr, NP  Multiple Vitamin (MULTIVITAMIN WITH MINERALS) TABS tablet Place 1 tablet into feeding tube daily. 04/16/21   Samella Parr, NP  Nutritional Supplements (FEEDING SUPPLEMENT, JEVITY 1.5 CAL/FIBER,) LIQD Place 750 mLs into feeding tube daily. 04/15/21   Samella Parr, NP  Nutritional Supplements (FEEDING SUPPLEMENT, PROSOURCE TF,) liquid  Place 45 mLs into feeding tube daily. 04/15/21   Samella Parr, NP  pantoprazole sodium (PROTONIX) 40 mg Place 40 mg into feeding tube daily. 04/15/21   Samella Parr, NP  polyethylene glycol (MIRALAX / GLYCOLAX) 17 g packet Place 17 g into feeding tube daily as needed for moderate constipation. 04/15/21   Samella Parr, NP  traMADol (ULTRAM) 50 MG tablet Place 0.5 tablets (25 mg total) into feeding tube every 12 (twelve) hours as needed for moderate pain. 04/15/21   Samella Parr, NP  Water For Irrigation, Sterile (FREE WATER) SOLN Place 200 mLs into feeding tube every 4 (four) hours. 04/15/21   Samella Parr, NP  metoprolol tartrate (LOPRESSOR) 25 MG tablet Take 50 mg by mouth daily. 03/10/14 06/03/20  Lorayne Marek, MD    Physical Exam: Vitals:   02/24/22 2130 02/24/22 2145 02/24/22 2200 02/24/22 2215  BP: 100/72 98/68 100/70 100/72  Pulse: 82 82 83 84  Resp: 16 17 16 15   Temp:  TempSrc:      SpO2: 97% 98% 98% 98%    Physical Exam Constitutional:      General: She is not in acute distress.    Appearance: Normal appearance. She is ill-appearing.  HENT:     Head: Normocephalic and atraumatic.     Mouth/Throat:     Mouth: Mucous membranes are moist.     Pharynx: Oropharynx is clear.  Eyes:     Extraocular Movements: Extraocular movements intact.     Pupils: Pupils are equal, round, and reactive to light.  Cardiovascular:     Rate and Rhythm: Normal rate and regular rhythm.     Pulses: Normal pulses.     Heart sounds: Normal heart sounds.  Pulmonary:     Effort: Pulmonary effort is normal. No respiratory distress.     Breath sounds: Normal breath sounds.  Abdominal:     General: Bowel sounds are normal. There is no distension.     Palpations: Abdomen is soft.     Tenderness: There is no abdominal tenderness.  Musculoskeletal:        General: No swelling or deformity.  Skin:    General: Skin is warm and dry.  Neurological:     Comments: Currently postictal  and noninteractive    Labs on Admission: I have personally reviewed following labs and imaging studies  CBC: Recent Labs  Lab 02/24/22 1954 02/24/22 2008  WBC 7.0  --   NEUTROABS 4.0  --   HGB 9.8* 9.9*  HCT 31.6* 29.0*  MCV 80.8  --   PLT 228  --     Basic Metabolic Panel: Recent Labs  Lab 02/24/22 1954 02/24/22 2008  NA 140 141  K 3.6 3.5  CL 107 104  CO2 26  --   GLUCOSE 96 89  BUN 20 21*  CREATININE 0.51 0.50  CALCIUM 9.0  --     GFR: CrCl cannot be calculated (Unknown ideal weight.).  Liver Function Tests: Recent Labs  Lab 02/24/22 1954  AST 16  ALT 17  ALKPHOS 66  BILITOT 0.3  PROT 6.3*  ALBUMIN 3.1*    Urine analysis:    Component Value Date/Time   COLORURINE YELLOW 03/19/2021 0910   APPEARANCEUR CLOUDY (A) 03/19/2021 0910   LABSPEC 1.008 03/19/2021 0910   PHURINE 7.0 03/19/2021 0910   GLUCOSEU NEGATIVE 03/19/2021 0910   HGBUR MODERATE (A) 03/19/2021 0910   BILIRUBINUR NEGATIVE 03/19/2021 0910   KETONESUR NEGATIVE 03/19/2021 0910   PROTEINUR NEGATIVE 03/19/2021 0910   UROBILINOGEN 0.2 02/21/2014 1629   NITRITE NEGATIVE 03/19/2021 0910   LEUKOCYTESUR LARGE (A) 03/19/2021 0910    Radiological Exams on Admission: CT HEAD WO CONTRAST  Result Date: 02/24/2022 CLINICAL DATA:  Mental status change, unknown cause EXAM: CT HEAD WITHOUT CONTRAST TECHNIQUE: Contiguous axial images were obtained from the base of the skull through the vertex without intravenous contrast. RADIATION DOSE REDUCTION: This exam was performed according to the departmental dose-optimization program which includes automated exposure control, adjustment of the mA and/or kV according to patient size and/or use of iterative reconstruction technique. COMPARISON:  04/08/2021 FINDINGS: Brain: Old right thalamic and basal ganglia lacunar infarcts. Encephalomalacia/gliosis involving the right corona radiata and inferior right temporal lobe. No acute intracranial abnormality.  Specifically, no hemorrhage, hydrocephalus, mass lesion, acute infarction, or significant intracranial injury. Vascular: No hyperdense vessel or unexpected calcification. Skull: No acute calvarial abnormality. Sinuses/Orbits: No acute findings Other: None IMPRESSION: Chronic changes as above, stable since prior study. No acute intracranial  abnormality. Electronically Signed   By: Charlett Nose M.D.   On: 02/24/2022 21:25   DG Chest Port 1 View  Result Date: 02/24/2022 CLINICAL DATA:  Altered mental status.  Code STEMI EXAM: PORTABLE CHEST 1 VIEW COMPARISON:  One-view chest x-ray 03/25/2021 FINDINGS: Tracheostomy tube was removed. Heart size is normal. Mild pulmonary vascular congestion is present without frank edema. No focal airspace consolidation is present. Asymmetric scarring is again noted at the left apex. The visualized soft tissues and bony thorax are unremarkable. IMPRESSION: 1. Mild pulmonary vascular congestion without frank edema. 2. Stable asymmetric scarring at the left apex. Electronically Signed   By: Marin Roberts M.D.   On: 02/24/2022 20:08    EKG: Independently reviewed.  Sinus rhythm at 90 bpm.  Nonspecific ST changes possible upsloping appearance.  Baseline artifact.  Assessment/Plan Principal Problem:   Seizure (HCC) Active Problems:   Essential hypertension, benign   History of urinary retention-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   HLD (hyperlipidemia)  Seizure > Patient presenting with altered mental status from facility with new gaze preference decreased responsiveness and lipsmacking. > This is on baseline of largely nonverbal, able to track with eyes and able to write some in the setting of history of severe stroke. > Lipsmacking and gaze preference improved with benzodiazepine in ED. > Neurology has been consulted working diagnosis is seizure, patient has decreased threshold in the setting of known stroke history.   > Unclear trigger for this seizure at this time.  Family states  that facility informed him that she had a high fever yesterday and that again today.  Unclear if this was a trigger for seizures or secondary to seizures.  Negative for flu and COVID in the ED, no leukocytosis, will check viral panel given that there is no evidence of bacterial infection at this time. - Monitor on progressive unit - Appreciate neurology recommendations - Continue with Keppra as ordered by neurology - Continue with EEG followed by continuous monitoring EEG as ordered by neurology - MRI following EEG - Supportive care - As needed Ativan for breakthrough seizure - RVP  Hx of intracerebral hemorrhage Significant left-sided and functional deficits, largely nonverbal, resides at facility.  Is able to communicate with writing and follows commands and interacts at baseline.  Can feed for a number of spoonfuls at baseline. Urinary retention G-tube dependence - Continue home doxazosin - Continue modafinil - Continue home baclofen - Continue home free water - Consult to nutrition for tube feeds  Hypertension - Continue home amlodipine, carvedilol - Hold off on hydralazine for now  Hyperlipidemia - Continue home atorvastatin  DVT prophylaxis: Lovenox Code Status:   Full Family Communication:  Updated at bedside Disposition Plan:   Patient is from:  Home  Anticipated DC to:  Home  Anticipated DC date:  1 to 2 days  Anticipated DC barriers: None  Consults called:  Neurology, consulted in the ED, have seen the patient and are following Admission status:  Observation, progressive  Severity of Illness: The appropriate patient status for this patient is OBSERVATION. Observation status is judged to be reasonable and necessary in order to provide the required intensity of service to ensure the patient's safety. The patient's presenting symptoms, physical exam findings, and initial radiographic and laboratory data in the context of their medical condition is felt to place them at  decreased risk for further clinical deterioration. Furthermore, it is anticipated that the patient will be medically stable for discharge from the hospital within 2 midnights of admission.  Marcelyn Bruins MD [facility]  How to contact the [facility] Attending or Consulting provider [place] or covering provider during after hours [place], for this patient?   Check the care team in [facility] and look for a) attending/consulting [facility] provider listed and b) the [facility] team listed Log into [email or web address] and use [place]'s universal password to access. If you do not have the password, please contact the hospital operator. Locate the [facility] provider you are looking for under [facility] and page to a number that you can be directly reached. If you still have difficulty reaching the provider, please page the [facility] (Director on Call) for the Hospitalists listed on amion for assistance.  02/24/2022, 10:53 PM

## 2022-02-24 NOTE — ED Notes (Signed)
Pt grinding her teeth in a repetitive motion, EDP aware.

## 2022-02-24 NOTE — ED Triage Notes (Signed)
Pt BIB EMS from [facility] SNF for altered mental status, Code stmi and right sided gaze. Altered mental status began yesterday night along with nausea and diarrhea. Pt nonverbal at base line with baseline left sided deficits from a previous stroke   VSS

## 2022-02-24 NOTE — Progress Notes (Signed)
STAT EEG complete - results pending. ? ?

## 2022-02-24 NOTE — ED Provider Notes (Signed)
[facility] EMERGENCY DEPARTMENT Provider Note   CSN: [id number] Arrival date & time: 02/24/22  1938     History  Chief Complaint  Patient presents with   Code STEMI    Charlene [name] is a 56 y.o. female PMH CVA with left-sided deficits, hypertension, G-tube dependence who is presenting with altered mental status.  Patient brought in by EMS, contributed history.  They report that earlier today, nursing facility noticed she was "altered" and ordered some STAT blood work, which had not yet resulted.  EMS was called.  Upon their arrival, patient was not following commands, with right-sided gaze preference, though moves right arm and leg.  EMS obtained cardiac rhythm strip due to altered mental status, and there was some concern for ST elevations in lead V2.  Husband is arrived at bedside, able to contribute more history.  He reports that for the last month, patient has been intermittently grinding her teeth.  Initially thought it was due to dental problems, however patient underwent tooth extraction, and grinding of teeth is persistent.   Home Medications Prior to Admission medications   Medication Sig Start Date End Date Taking? Authorizing Provider  acetaminophen (TYLENOL) 325 MG tablet Place 2 tablets (650 mg total) into feeding tube every 6 (six) hours. 04/15/21   Russella Dar, NP  amLODipine (NORVASC) 10 MG tablet Place 1 tablet (10 mg total) into feeding tube daily. 04/16/21   Russella Dar, NP  atorvastatin (LIPITOR) 40 MG tablet Place 1 tablet (40 mg total) into feeding tube daily. 04/16/21   Russella Dar, NP  baclofen (OZOBAX) 1 mg/mL SOLN oral solution Place 5 mLs (5 mg total) into feeding tube at bedtime. 04/15/21   Russella Dar, NP  benzocaine (ORAJEL) 10 % mucosal gel Use as directed in the mouth or throat 4 (four) times daily as needed for mouth pain. 04/15/21   Russella Dar, NP  carvedilol (COREG) 25 MG tablet Place 1 tablet (25 mg total) into  feeding tube 2 (two) times daily with a meal. 04/15/21   Russella Dar, NP  diclofenac Sodium (VOLTAREN) 1 % GEL Apply 4 g topically 4 (four) times daily. 04/15/21   Russella Dar, NP  doxazosin (CARDURA) 2 MG tablet Place 1 tablet (2 mg total) into feeding tube daily. 04/16/21   Russella Dar, NP  enoxaparin (LOVENOX) 40 MG/0.4ML injection Inject 0.4 mLs (40 mg total) into the skin daily. 04/15/21   Russella Dar, NP  feeding supplement (ENSURE ENLIVE / ENSURE PLUS) LIQD Place 237 mLs into feeding tube 3 (three) times daily between meals. 04/15/21   Russella Dar, NP  fluticasone (FLONASE) 50 MCG/ACT nasal spray Place 2 sprays into both nostrils daily. 04/16/21   Russella Dar, NP  hydrALAZINE (APRESOLINE) 25 MG tablet Place 1.5 tablets (37.5 mg total) into feeding tube every 8 (eight) hours. 04/15/21   Russella Dar, NP  magic mouthwash w/lidocaine SOLN Take 10 mLs by mouth 4 (four) times daily as needed for mouth pain. 04/15/21   Russella Dar, NP  modafinil (PROVIGIL) 200 MG tablet Place 1 tablet (200 mg total) into feeding tube daily. 04/16/21   Russella Dar, NP  Multiple Vitamin (MULTIVITAMIN WITH MINERALS) TABS tablet Place 1 tablet into feeding tube daily. 04/16/21   Russella Dar, NP  Nutritional Supplements (FEEDING SUPPLEMENT, JEVITY 1.5 CAL/FIBER,) LIQD Place 750 mLs into feeding tube daily. 04/15/21   Russella Dar, NP  Nutritional  Supplements (FEEDING SUPPLEMENT, PROSOURCE TF,) liquid Place 45 mLs into feeding tube daily. 04/15/21   Russella Dar, NP  pantoprazole sodium (PROTONIX) 40 mg Place 40 mg into feeding tube daily. 04/15/21   Russella Dar, NP  polyethylene glycol (MIRALAX / GLYCOLAX) 17 g packet Place 17 g into feeding tube daily as needed for moderate constipation. 04/15/21   Russella Dar, NP  traMADol (ULTRAM) 50 MG tablet Place 0.5 tablets (25 mg total) into feeding tube every 12 (twelve) hours as needed for moderate pain. 04/15/21   Russella Dar, NP  Water For Irrigation, Sterile (FREE WATER) SOLN Place 200 mLs into feeding tube every 4 (four) hours. 04/15/21   Russella Dar, NP  metoprolol tartrate (LOPRESSOR) 25 MG tablet Take 50 mg by mouth daily. 03/10/14 06/03/20  Doris Cheadle, MD      Allergies    Patient has no known allergies.    Review of Systems   Review of Systems As in HPI.  Physical Exam Updated Vital Signs BP 107/79   Pulse 89   Resp 17   LMP 03/03/2014   SpO2 100%  Physical Exam Vitals and nursing note reviewed.  Constitutional:      General: She is not in acute distress.    Appearance: She is well-developed. She is not diaphoretic.     Comments: Chronically ill-appearing female laying in bed.  HENT:     Head: Normocephalic and atraumatic.     Right Ear: External ear normal.     Left Ear: External ear normal.     Nose: Nose normal.     Mouth/Throat:     Mouth: Mucous membranes are moist.  Eyes:     Comments: Right gaze preference.  Cardiovascular:     Rate and Rhythm: Normal rate and regular rhythm.     Heart sounds: No murmur heard. Pulmonary:     Effort: Pulmonary effort is normal. No respiratory distress.     Breath sounds: Normal breath sounds.  Abdominal:     General: There is no distension.     Palpations: Abdomen is soft.     Tenderness: There is no abdominal tenderness. There is no guarding.     Comments: G-tube in place.  Musculoskeletal:     Cervical back: Neck supple.     Right lower leg: No edema.     Left lower leg: No edema.  Skin:    General: Skin is warm and dry.  Neurological:     Mental Status: She is alert.     Comments: Does not follow commands or verbalize responses to questions.  Left-sided deficits, but moves right side extremities spontaneously.     ED Results / Procedures / Treatments   Labs (all labs ordered are listed, but only abnormal results are displayed) Labs Reviewed  RESP PANEL BY RT-PCR (FLU A&B, COVID) ARPGX2  ETHANOL  PROTIME-INR   APTT  CBC  DIFFERENTIAL  COMPREHENSIVE METABOLIC PANEL  RAPID URINE DRUG SCREEN, HOSP PERFORMED  URINALYSIS, ROUTINE W REFLEX MICROSCOPIC  I-STAT CHEM 8, ED  I-STAT BETA HCG BLOOD, ED ([facility], [facility], [facility] ONLY)  TROPONIN I (HIGH SENSITIVITY)    EKG EKG Interpretation  Date/Time:  Thursday February 24 2022 19:51:56 EDT Ventricular Rate:  90 PR Interval:  150 QRS Duration: 96 QT Interval:  405 QTC Calculation: 496 R Axis:   30 Text Interpretation: Sinus rhythm Probable anteroseptal infarct, recent No significant change since last tracing Confirmed by Richardean Canal [phone]) on  02/24/2022 8:35:37 PM  Radiology DG Chest Port 1 View  Result Date: 02/24/2022 CLINICAL DATA:  Altered mental status.  Code STEMI EXAM: PORTABLE CHEST 1 VIEW COMPARISON:  One-view chest x-ray 03/25/2021 FINDINGS: Tracheostomy tube was removed. Heart size is normal. Mild pulmonary vascular congestion is present without frank edema. No focal airspace consolidation is present. Asymmetric scarring is again noted at the left apex. The visualized soft tissues and bony thorax are unremarkable. IMPRESSION: 1. Mild pulmonary vascular congestion without frank edema. 2. Stable asymmetric scarring at the left apex. Electronically Signed   By: Marin Roberts M.D.   On: 02/24/2022 20:08    Procedures Procedures   Medications Ordered in ED Medications - No data to display  ED Course/ Medical Decision Making/ A&P                           Medical Decision Making Amount and/or Complexity of Data Reviewed Labs: ordered. Radiology: ordered.  Risk Prescription drug management. Decision regarding hospitalization.   ABBAGAYLE ZARAGOZA is a 56 y.o. female with significant PMHx CVA (L sided deficits) who presented to the ED with altered mental status.  Vitals at presentation within normal limits.  Patient is hemodynamically stable, afebrile, satting well on room air.  Physical exam notable as above with right gaze preference,  inability to follow commands, nonverbal status, left-sided deficits (which are chronic per chart review from old stroke).  Normal heart sounds.  Lungs clear to auscultation bilaterally.  Abdomen is soft, nontender to palpation.  No pitting edema.  Initial differential includes but is not limited to: Stroke, seizure, sepsis, infection, dose, polypharmacy  Lab work obtained, resulted notable for CMP with electrolytes within normal limits.  No evidence of AKI.  No elevated LFTs.  No anion gap.  CBC without leukocytosis.  Hemoglobin 9.8, consistent with prior.  Troponin 4.  All negative.  COVID/flu swab negative.  UA has been ordered, as has UDS, but has not resulted.  Imaging was pursued, notable for CT head with no acute new intracranial abnormalities per radiology read. CXR per radiology with concern for mild pulmonary vascular congestion without frank edema, and stable asymmetric scarring at the left apex.  EKG obtained, demonstrates sinus rhythm, ventricular rate 90 bpm.  ST elevation seen in V2, but no reciprocal changes.  Unlikely STEMI.  Interpreted by myself and my attending.  Neurology consulted, came and evaluate the patient.  Please see their note for additional details.  They are concerned that the patient may be experiencing seizures.  They have ordered EEG.  Additionally they are recommending Keppra and MRI brain along with inpatient admission.  We will admit the patient to medicine for additional work up.  The plan for this patient was discussed with Dr. Silverio Lay, who voiced agreement and who oversaw evaluation and treatment of this patient.   Final Clinical Impression(s) / ED Diagnoses Final diagnoses:  Seizure-like activity [facility])    Rx / DC Orders ED Discharge Orders     None         Skeet Simmer, MD 02/24/22 2335    Charlynne Pander, MD 02/25/22 [phone]

## 2022-02-25 ENCOUNTER — Encounter (HOSPITAL_COMMUNITY): Payer: Self-pay | Admitting: Internal Medicine

## 2022-02-25 ENCOUNTER — Observation Stay (HOSPITAL_COMMUNITY): Payer: Medicaid Other

## 2022-02-25 ENCOUNTER — Other Ambulatory Visit: Payer: Self-pay

## 2022-02-25 DIAGNOSIS — I69198 Other sequelae of nontraumatic intracerebral hemorrhage: Secondary | ICD-10-CM | POA: Diagnosis not present

## 2022-02-25 DIAGNOSIS — Z931 Gastrostomy status: Secondary | ICD-10-CM | POA: Diagnosis not present

## 2022-02-25 DIAGNOSIS — G4089 Other seizures: Secondary | ICD-10-CM | POA: Diagnosis present

## 2022-02-25 DIAGNOSIS — Z20822 Contact with and (suspected) exposure to covid-19: Secondary | ICD-10-CM | POA: Diagnosis present

## 2022-02-25 DIAGNOSIS — Z79899 Other long term (current) drug therapy: Secondary | ICD-10-CM | POA: Diagnosis not present

## 2022-02-25 DIAGNOSIS — I10 Essential (primary) hypertension: Secondary | ICD-10-CM | POA: Diagnosis present

## 2022-02-25 DIAGNOSIS — I69154 Hemiplegia and hemiparesis following nontraumatic intracerebral hemorrhage affecting left non-dominant side: Secondary | ICD-10-CM | POA: Diagnosis not present

## 2022-02-25 DIAGNOSIS — E785 Hyperlipidemia, unspecified: Secondary | ICD-10-CM | POA: Diagnosis present

## 2022-02-25 DIAGNOSIS — F32A Depression, unspecified: Secondary | ICD-10-CM | POA: Diagnosis present

## 2022-02-25 DIAGNOSIS — R569 Unspecified convulsions: Secondary | ICD-10-CM | POA: Diagnosis not present

## 2022-02-25 DIAGNOSIS — G9341 Metabolic encephalopathy: Secondary | ICD-10-CM | POA: Diagnosis present

## 2022-02-25 DIAGNOSIS — Z8249 Family history of ischemic heart disease and other diseases of the circulatory system: Secondary | ICD-10-CM | POA: Diagnosis not present

## 2022-02-25 DIAGNOSIS — F419 Anxiety disorder, unspecified: Secondary | ICD-10-CM | POA: Diagnosis present

## 2022-02-25 DIAGNOSIS — R339 Retention of urine, unspecified: Secondary | ICD-10-CM | POA: Diagnosis present

## 2022-02-25 LAB — URINALYSIS, ROUTINE W REFLEX MICROSCOPIC
Bilirubin Urine: NEGATIVE
Glucose, UA: NEGATIVE mg/dL
Hgb urine dipstick: NEGATIVE
Ketones, ur: NEGATIVE mg/dL
Nitrite: NEGATIVE
Protein, ur: NEGATIVE mg/dL
Specific Gravity, Urine: 1.006 (ref 1.005–1.030)
pH: 6 (ref 5.0–8.0)

## 2022-02-25 LAB — I-STAT ARTERIAL BLOOD GAS, ED
Acid-Base Excess: 3 mmol/L — ABNORMAL HIGH (ref 0.0–2.0)
Bicarbonate: 28.4 mmol/L — ABNORMAL HIGH (ref 20.0–28.0)
Calcium, Ion: 1.29 mmol/L (ref 1.15–1.40)
HCT: 29 % — ABNORMAL LOW (ref 36.0–46.0)
Hemoglobin: 9.9 g/dL — ABNORMAL LOW (ref 12.0–15.0)
O2 Saturation: 97 %
Patient temperature: 98
Potassium: 3.2 mmol/L — ABNORMAL LOW (ref 3.5–5.1)
Sodium: 142 mmol/L (ref 135–145)
TCO2: 30 mmol/L (ref 22–32)
pCO2 arterial: 45.5 mmHg (ref 32–48)
pH, Arterial: 7.401 (ref 7.35–7.45)
pO2, Arterial: 88 mmHg (ref 83–108)

## 2022-02-25 LAB — RESPIRATORY PANEL BY PCR

## 2022-02-25 LAB — CBC
HCT: 30.3 % — ABNORMAL LOW (ref 36.0–46.0)
Hemoglobin: 9.5 g/dL — ABNORMAL LOW (ref 12.0–15.0)
MCH: 25.1 pg — ABNORMAL LOW (ref 26.0–34.0)
MCHC: 31.4 g/dL (ref 30.0–36.0)
MCV: 80.2 fL (ref 80.0–100.0)
Platelets: 222 10*3/uL (ref 150–400)
RBC: 3.78 MIL/uL — ABNORMAL LOW (ref 3.87–5.11)
RDW: 14.1 % (ref 11.5–15.5)
WBC: 6.2 10*3/uL (ref 4.0–10.5)
nRBC: 0 % (ref 0.0–0.2)

## 2022-02-25 LAB — COMPREHENSIVE METABOLIC PANEL
ALT: 17 U/L (ref 0–44)
AST: 17 U/L (ref 15–41)
Albumin: 3.1 g/dL — ABNORMAL LOW (ref 3.5–5.0)
Alkaline Phosphatase: 70 U/L (ref 38–126)
Anion gap: 8 (ref 5–15)
BUN: 16 mg/dL (ref 6–20)
CO2: 26 mmol/L (ref 22–32)
Calcium: 9.1 mg/dL (ref 8.9–10.3)
Chloride: 106 mmol/L (ref 98–111)
Creatinine, Ser: 0.51 mg/dL (ref 0.44–1.00)
GFR, Estimated: >60 mL/min (ref >60)
Glucose, Bld: 87 mg/dL (ref 70–99)
Potassium: 3.3 mmol/L — ABNORMAL LOW (ref 3.5–5.1)
Sodium: 140 mmol/L (ref 135–145)
Total Bilirubin: 0.5 mg/dL (ref 0.3–1.2)
Total Protein: 6.3 g/dL — ABNORMAL LOW (ref 6.5–8.1)

## 2022-02-25 LAB — HIV ANTIBODY (ROUTINE TESTING W REFLEX): HIV Screen 4th Generation wRfx: NONREACTIVE

## 2022-02-25 LAB — RAPID URINE DRUG SCREEN, HOSP PERFORMED
Amphetamines: NOT DETECTED
Barbiturates: NOT DETECTED
Benzodiazepines: NOT DETECTED
Cocaine: NOT DETECTED
Opiates: NOT DETECTED
Tetrahydrocannabinol: NOT DETECTED

## 2022-02-25 LAB — GLUCOSE, CAPILLARY
Glucose-Capillary: 71 mg/dL (ref 70–99)
Glucose-Capillary: 101 mg/dL — ABNORMAL HIGH (ref 70–99)

## 2022-02-25 MED ORDER — OSMOLITE 1.5 CAL PO LIQD
1000.0000 mL | ORAL | Status: DC
  Administered 2022-02-25 – 2022-02-26 (×2): 1000 mL
  Filled 2022-02-25: qty 1000

## 2022-02-25 MED ORDER — GADOBUTROL 1 MMOL/ML IV SOLN
7.0000 mL | Freq: Once | INTRAVENOUS | Status: AC | PRN
  Administered 2022-02-25: 7 mL via INTRAVENOUS

## 2022-02-25 MED ORDER — OSMOLITE 1.2 CAL PO LIQD: 1000.0000 mL | ORAL | Status: DC

## 2022-02-25 NOTE — Procedures (Signed)
Patient Name: Charlene [name]  MRN: [id number]  Epilepsy Attending: Charlsie Quest  Referring Physician/Provider: Milon Dikes, MD  Duration: 02/24/2022 2333 to 02/25/2022 1340   Patient history: 56 year old with prior right thalamic ICH, IVH with ensuing left-sided spastic hemiparesis, resident of a SNF with a poor modified Rankin score at baseline, presenting with what appears to be new onset seizure. EEG to evaluate for seizure   Level of alertness: Awake, asleep   AEDs during EEG study: LEV   Technical aspects: This EEG study was done with scalp electrodes positioned according to the 10-20 International system of electrode placement. Electrical activity was reviewed with band pass filter of 1-70Hz , sensitivity of 7 uV/mm, display speed of 26mm/sec with a 60Hz  notched filter applied as appropriate. EEG data were recorded continuously and digitally stored.  Video monitoring was available and reviewed as appropriate.   Description: The posterior dominant rhythm consists of 9 Hz activity of moderate voltage (25-35 uV) seen predominantly in posterior head regions, symmetric and reactive to eye opening and eye closing.  Sleep was characterized by vertex waves, sleep spindles (12 to 14 Hz), maximal frontocentral region.  EEG showed intermittent generalized and lateralized right hemisphere 3 to 6 Hz theta-delta slowing.  Hyperventilation and photic stimulation were not performed.     ABNORMALITY -Intermittent slow, generalized and lateralized right hemisphere   IMPRESSION: This study is suggestive of cortical dysfunction in right hemisphere likely secondary to underlying stroke as well as mild diffuse encephalopathy, nonspecific etiology. No seizures or epileptiform discharges were seen throughout the recording.   Dusty Raczkowski 

## 2022-02-25 NOTE — Progress Notes (Signed)
Pt's dtr and mom at bedside. They share that pt vomited (high velocity) on Wednesday 8/8. Her fever began after the vomiting. They were told by a nurse at pt's facility that vomiting continued throughout the night. They state she was first noted to be altered on Thursday 8/9.

## 2022-02-25 NOTE — ED Notes (Signed)
ED TO INPATIENT HANDOFF REPORT  ED Nurse Name and Phone #: [phone]  S Name/Age/Gender Charlene [name] 56 y.o. female Room/Bed: 013C/013C  Code Status   Code Status: Full Code  Home/SNF/Other Skilled nursing facility   Triage Complete: Triage complete  Chief Complaint Seizure [facility]) [R56.9]  Triage Note Pt BIB EMS from [facility] for altered mental status, Code stmi and right sided gaze. Altered mental status began yesterday night along with nausea and diarrhea. Pt nonverbal at base line with baseline left sided deficits from a previous stroke   VSS   Allergies No Known Allergies  Level of Care/Admitting Diagnosis ED Disposition     ED Disposition  Admit   Condition  --   [place]: [place] [[id number]]  Level of Care: Progressive [102]  Admit to Progressive based on following criteria: NEUROLOGICAL AND NEUROSURGICAL complex patients with significant risk of instability, who do not meet ICU criteria, yet require close observation or frequent assessment (< / = every 2 - 4 hours) with medical / nursing intervention.  May place patient in observation at [facility] or [place] if equivalent level of care is available:: No  Covid Evaluation: Asymptomatic - no recent exposure (last 10 days) testing not required  Diagnosis: Seizure [facility]) [[id number]]  Admitting Physician: Marcelyn Bruins [[id number]]  Attending Physician: Marcelyn Bruins [[id number]]          B Medical/Surgery History Past Medical History:  Diagnosis Date   Acute cystitis without hematuria    Acute hypoxemic respiratory failure ([place])    Acute otitis media with effusion of right ear    Altered mental status    Chest pain 02/21/2014   CVA (cerebral vascular accident) ([place])    Headache 02/21/2014   HTN (hypertension), malignant 02/21/2014   Hypertension    Infective otitis externa of both ears    Middle ear effusion, bilateral 04/14/2021   Nausea & vomiting 02/21/2014    Tracheostomy dependent [facility])    Past Surgical History:  Procedure Laterality Date   CESAREAN SECTION     ESOPHAGOGASTRODUODENOSCOPY N/A 09/10/2020   Procedure: ESOPHAGOGASTRODUODENOSCOPY (EGD);  Surgeon: Kinsinger, Arta Bruce, MD;  Location: [place];  Service: General;  Laterality: N/A;   IR REPLACE G-TUBE SIMPLE WO FLUORO  07/30/2021   IR [place] W/FLUORO  02/16/2021   IR [place] GASTRO/COLONIC TUBE PERCUT W/FLUORO  10/23/2021   PEG PLACEMENT N/A 09/10/2020   Procedure: PERCUTANEOUS ENDOSCOPIC GASTROSTOMY (PEG) PLACEMENT;  Surgeon: Mickeal Skinner, MD;  Location: [place];  Service: General;  Laterality: N/A;     A IV Location/Drains/Wounds Patient Lines/Drains/Airways Status     Active Line/Drains/Airways     Name Placement date Placement time Site Days   Peripheral IV 02/24/22 18 G Right Antecubital 02/24/22  2023  Antecubital  1   Gastrostomy/Enterostomy Gastrostomy 18 Fr. LUQ 10/23/21  1301  LUQ  125            Intake/Output Last 24 hours No intake or output data in the 24 hours ending 02/25/22 1407  Labs/Imaging Results for orders placed or performed during the hospital encounter of 02/24/22 (from the past 48 hour(s))  Resp Panel by RT-PCR (Flu A&B, Covid) Anterior Nasal Swab     Status: None   Collection Time: 02/24/22  7:50 PM   Specimen: Anterior Nasal Swab  Result Value Ref Range   SARS Coronavirus 2 by RT PCR NEGATIVE NEGATIVE    Comment: (NOTE) SARS-CoV-2 target  nucleic acids are NOT DETECTED.  The SARS-CoV-2 RNA is generally detectable in upper respiratory specimens during the acute phase of infection. The lowest concentration of SARS-CoV-2 viral copies this assay can detect is 138 copies/mL. A negative result does not preclude SARS-Cov-2 infection and should not be used as the sole basis for treatment or other patient management decisions. A negative result may occur with  improper specimen collection/handling, submission  of specimen other than nasopharyngeal swab, presence of viral mutation(s) within the areas targeted by this assay, and inadequate number of viral copies(<138 copies/mL). A negative result must be combined with clinical observations, patient history, and epidemiological information. The expected result is Negative.  Fact Sheet for Patients:  EntrepreneurPulse.com.au  Fact Sheet for Healthcare Providers:  IncredibleEmployment.be  This test is no t yet approved or cleared by the [place] FDA and  has been authorized for detection and/or diagnosis of SARS-CoV-2 by FDA under an Emergency Use Authorization (EUA). This EUA will remain  in effect (meaning this test can be used) for the duration of the COVID-19 declaration under Section 564(b)(1) of the Act, 21 U.S.C.section 360bbb-3(b)(1), unless the authorization is terminated  or revoked sooner.       Influenza A by PCR NEGATIVE NEGATIVE   Influenza B by PCR NEGATIVE NEGATIVE    Comment: (NOTE) The Xpert Xpress SARS-CoV-2/FLU/RSV plus assay is intended as an aid in the diagnosis of influenza from Nasopharyngeal swab specimens and should not be used as a sole basis for treatment. Nasal washings and aspirates are unacceptable for Xpert Xpress SARS-CoV-2/FLU/RSV testing.  Fact Sheet for Patients: EntrepreneurPulse.com.au  Fact Sheet for Healthcare Providers: IncredibleEmployment.be  This test is not yet approved or cleared by the [place] FDA and has been authorized for detection and/or diagnosis of SARS-CoV-2 by FDA under an Emergency Use Authorization (EUA). This EUA will remain in effect (meaning this test can be used) for the duration of the COVID-19 declaration under Section 564(b)(1) of the Act, 21 U.S.C. section 360bbb-3(b)(1), unless the authorization is terminated or revoked.  Performed at [facility], [place] [address] [place],  [place] [address]   Ethanol     Status: None   Collection Time: 02/24/22  7:54 PM  Result Value Ref Range   Alcohol, Ethyl (B) <10 <10 mg/dL    Comment: (NOTE) Lowest detectable limit for serum alcohol is 10 mg/dL.  For medical purposes only. Performed at [facility], [place] [address], [place], [place] [address]   Protime-INR     Status: None   Collection Time: 02/24/22  7:54 PM  Result Value Ref Range   Prothrombin Time 14.5 11.4 - 15.2 seconds   INR 1.1 0.8 - 1.2    Comment: (NOTE) INR goal varies based on device and disease states. Performed at [facility], [place] [address] [place], [place] [address]   APTT     Status: None   Collection Time: 02/24/22  7:54 PM  Result Value Ref Range   aPTT 29 24 - 36 seconds    Comment: Performed at [place] [address], [place] [address]  CBC     Status: Abnormal   Collection Time: 02/24/22  7:54 PM  Result Value Ref Range   WBC 7.0 4.0 - 10.5 K/uL   RBC 3.91 3.87 - 5.11 MIL/uL   Hemoglobin 9.8 (L) 12.0 - 15.0 g/dL   HCT 31.6 (L) 36.0 - 46.0 %   MCV 80.8 80.0 -  100.0 fL   MCH 25.1 (L) 26.0 - 34.0 pg   MCHC 31.0 30.0 - 36.0 g/dL   RDW 14.0 11.5 - 15.5 %   Platelets 228 150 - 400 K/uL   nRBC 0.0 0.0 - 0.2 %    Comment: Performed at [facility], [place] [address], [place], [place] [address]  Differential     Status: None   Collection Time: 02/24/22  7:54 PM  Result Value Ref Range   Neutrophils Relative % 57 %   Neutro Abs 4.0 1.7 - 7.7 K/uL   Lymphocytes Relative 36 %   Lymphs Abs 2.5 0.7 - 4.0 K/uL   Monocytes Relative 6 %   Monocytes Absolute 0.4 0.1 - 1.0 K/uL   Eosinophils Relative 1 %   Eosinophils Absolute 0.0 0.0 - 0.5 K/uL   Basophils Relative 0 %   Basophils Absolute 0.0 0.0 - 0.1 K/uL   Immature Granulocytes 0 %   Abs Immature Granulocytes 0.02 0.00 - 0.07 K/uL    Comment: Performed at [facility], [place] [address], [place], [place] [address]  Comprehensive metabolic panel      Status: Abnormal   Collection Time: 02/24/22  7:54 PM  Result Value Ref Range   Sodium 140 135 - 145 mmol/L   Potassium 3.6 3.5 - 5.1 mmol/L   Chloride 107 98 - 111 mmol/L   CO2 26 22 - 32 mmol/L   Glucose, Bld 96 70 - 99 mg/dL    Comment: Glucose reference range applies only to samples taken after fasting for at least 8 hours.   BUN 20 6 - 20 mg/dL   Creatinine, Ser 0.51 0.44 - 1.00 mg/dL   Calcium 9.0 8.9 - 10.3 mg/dL   Total Protein 6.3 (L) 6.5 - 8.1 g/dL   Albumin 3.1 (L) 3.5 - 5.0 g/dL   AST 16 15 - 41 U/L   ALT 17 0 - 44 U/L   Alkaline Phosphatase 66 38 - 126 U/L   Total Bilirubin 0.3 0.3 - 1.2 mg/dL   GFR, Estimated >60 >60 mL/min    Comment: (NOTE) Calculated using the CKD-EPI Creatinine Equation (2021)    Anion gap 7 5 - 15    Comment: Performed at [facility], [place] [address], [place], [place] [address]  Troponin I (High Sensitivity)     Status: None   Collection Time: 02/24/22  7:54 PM  Result Value Ref Range   Troponin I (High Sensitivity) 4 <18 ng/L    Comment: (NOTE) Elevated high sensitivity troponin I (hsTnI) values and significant  changes across serial measurements may suggest ACS but many other  chronic and acute conditions are known to elevate hsTnI results.  Refer to the "Links" section for chest pain algorithms and additional  guidance. Performed at [facility], [place] [address] [place], [name] [address]   I-Stat beta hCG blood, ED     Status: None   Collection Time: 02/24/22  8:07 PM  Result Value Ref Range   I-stat hCG, quantitative <5.0 <5 mIU/mL   Comment 3            Comment:   GEST. AGE      CONC.  (mIU/mL)   <=1 WEEK        5 - 50     2 WEEKS       50 - 500     3 WEEKS       100 - 10,000  4 WEEKS     1,000 - 30,000        FEMALE AND NON-PREGNANT FEMALE:     LESS THAN 5 mIU/mL   I-stat chem 8, ED     Status: Abnormal   Collection Time: 02/24/22  8:08 PM  Result Value Ref Range   Sodium 141 135 - 145 mmol/L   Potassium  3.5 3.5 - 5.1 mmol/L   Chloride 104 98 - 111 mmol/L   BUN 21 (H) 6 - 20 mg/dL   Creatinine, Ser 0.50 0.44 - 1.00 mg/dL   Glucose, Bld 89 70 - 99 mg/dL    Comment: Glucose reference range applies only to samples taken after fasting for at least 8 hours.   Calcium, Ion 1.18 1.15 - 1.40 mmol/L   TCO2 26 22 - 32 mmol/L   Hemoglobin 9.9 (L) 12.0 - 15.0 g/dL   HCT 29.0 (L) 36.0 - 46.0 %  Troponin I (High Sensitivity)     Status: None   Collection Time: 02/24/22 10:42 PM  Result Value Ref Range   Troponin I (High Sensitivity) 3 <18 ng/L    Comment: (NOTE) Elevated high sensitivity troponin I (hsTnI) values and significant  changes across serial measurements may suggest ACS but many other  chronic and acute conditions are known to elevate hsTnI results.  Refer to the "Links" section for chest pain algorithms and additional  guidance. Performed at [facility], [place] [address] [place], [place] [address]   HIV Antibody (routine testing w rflx)     Status: None   Collection Time: 02/24/22 10:42 PM  Result Value Ref Range   HIV Screen 4th Generation wRfx Non Reactive Non Reactive    Comment: Performed at [facility], [place] [address] [place], [place] [address]  I-Stat arterial blood gas, ED     Status: Abnormal   Collection Time: 02/25/22 12:40 AM  Result Value Ref Range   pH, Arterial 7.401 7.35 - 7.45   pCO2 arterial 45.5 32 - 48 mmHg   pO2, Arterial 88 83 - 108 mmHg   Bicarbonate 28.4 (H) 20.0 - 28.0 mmol/L   TCO2 30 22 - 32 mmol/L   O2 Saturation 97 %   Acid-Base Excess 3.0 (H) 0.0 - 2.0 mmol/L   Sodium 142 135 - 145 mmol/L   Potassium 3.2 (L) 3.5 - 5.1 mmol/L   Calcium, Ion 1.29 1.15 - 1.40 mmol/L   HCT 29.0 (L) 36.0 - 46.0 %   Hemoglobin 9.9 (L) 12.0 - 15.0 g/dL   Patient temperature 98.0 F    Collection site RADIAL, ALLEN'S TEST ACCEPTABLE    Drawn by RT    Sample type ARTERIAL   Comprehensive metabolic panel     Status: Abnormal   Collection Time: 02/25/22   3:46 AM  Result Value Ref Range   Sodium 140 135 - 145 mmol/L   Potassium 3.3 (L) 3.5 - 5.1 mmol/L   Chloride 106 98 - 111 mmol/L   CO2 26 22 - 32 mmol/L   Glucose, Bld 87 70 - 99 mg/dL    Comment: Glucose reference range applies only to samples taken after fasting for at least 8 hours.   BUN 16 6 - 20 mg/dL   Creatinine, Ser 0.51 0.44 - 1.00 mg/dL   Calcium 9.1 8.9 - 10.3 mg/dL   Total Protein 6.3 (L) 6.5 - 8.1 g/dL   Albumin 3.1 (L) 3.5 - 5.0 g/dL   AST 17 15 - 41 U/L  ALT 17 0 - 44 U/L   Alkaline Phosphatase 70 38 - 126 U/L   Total Bilirubin 0.5 0.3 - 1.2 mg/dL   GFR, Estimated >60 >60 mL/min    Comment: (NOTE) Calculated using the CKD-EPI Creatinine Equation (2021)    Anion gap 8 5 - 15    Comment: Performed at [place] [address], [place], [place] [address]  CBC     Status: Abnormal   Collection Time: 02/25/22  3:46 AM  Result Value Ref Range   WBC 6.2 4.0 - 10.5 K/uL   RBC 3.78 (L) 3.87 - 5.11 MIL/uL   Hemoglobin 9.5 (L) 12.0 - 15.0 g/dL   HCT 30.3 (L) 36.0 - 46.0 %   MCV 80.2 80.0 - 100.0 fL   MCH 25.1 (L) 26.0 - 34.0 pg   MCHC 31.4 30.0 - 36.0 g/dL   RDW 14.1 11.5 - 15.5 %   Platelets 222 150 - 400 K/uL   nRBC 0.0 0.0 - 0.2 %    Comment: Performed at [facility], [place] [address], [place], [place] [address]  Respiratory (~20 pathogens) panel by PCR     Status: None   Collection Time: 02/25/22  4:42 AM   Specimen: Nasopharyngeal Swab; Respiratory  Result Value Ref Range   Adenovirus NOT DETECTED NOT DETECTED   Coronavirus 229E NOT DETECTED NOT DETECTED    Comment: (NOTE) The Coronavirus on the Respiratory Panel, DOES NOT test for the novel  Coronavirus (2019 nCoV)    Coronavirus HKU1 NOT DETECTED NOT DETECTED   Coronavirus NL63 NOT DETECTED NOT DETECTED   Coronavirus OC43 NOT DETECTED NOT DETECTED   Metapneumovirus NOT DETECTED NOT DETECTED   Rhinovirus / Enterovirus NOT DETECTED NOT DETECTED   Influenza A NOT DETECTED NOT DETECTED    Influenza B NOT DETECTED NOT DETECTED   Parainfluenza Virus 1 NOT DETECTED NOT DETECTED   Parainfluenza Virus 2 NOT DETECTED NOT DETECTED   Parainfluenza Virus 3 NOT DETECTED NOT DETECTED   Parainfluenza Virus 4 NOT DETECTED NOT DETECTED   Respiratory Syncytial Virus NOT DETECTED NOT DETECTED   Bordetella pertussis NOT DETECTED NOT DETECTED   Bordetella Parapertussis NOT DETECTED NOT DETECTED   Chlamydophila pneumoniae NOT DETECTED NOT DETECTED   Mycoplasma pneumoniae NOT DETECTED NOT DETECTED    Comment: Performed at [facility], [place] [address], [place], [place] [address]  Urine rapid drug screen (hosp performed)     Status: None   Collection Time: 02/25/22  7:30 AM  Result Value Ref Range   Opiates NONE DETECTED NONE DETECTED   Cocaine NONE DETECTED NONE DETECTED   Benzodiazepines NONE DETECTED NONE DETECTED   Amphetamines NONE DETECTED NONE DETECTED   Tetrahydrocannabinol NONE DETECTED NONE DETECTED   Barbiturates NONE DETECTED NONE DETECTED    Comment: (NOTE) DRUG SCREEN FOR MEDICAL PURPOSES ONLY.  IF CONFIRMATION IS NEEDED FOR ANY PURPOSE, NOTIFY LAB WITHIN 5 DAYS.  LOWEST DETECTABLE LIMITS FOR URINE DRUG SCREEN Drug Class                     Cutoff (ng/mL) Amphetamine and metabolites    1000 Barbiturate and metabolites    200 Benzodiazepine                 241 Tricyclics and metabolites     300 Opiates and metabolites        300 Cocaine and metabolites        300 THC  50 Performed at [facility], [place] [address] [place], [place] [address]   Urinalysis, Routine w reflex microscopic     Status: Abnormal   Collection Time: 02/25/22  7:30 AM  Result Value Ref Range   Color, Urine STRAW (A) YELLOW   APPearance CLEAR CLEAR   Specific Gravity, Urine 1.006 1.005 - 1.030   pH 6.0 5.0 - 8.0   Glucose, UA NEGATIVE NEGATIVE mg/dL   Hgb urine dipstick NEGATIVE NEGATIVE   Bilirubin Urine NEGATIVE NEGATIVE   Ketones, ur NEGATIVE NEGATIVE  mg/dL   Protein, ur NEGATIVE NEGATIVE mg/dL   Nitrite NEGATIVE NEGATIVE   Leukocytes,Ua TRACE (A) NEGATIVE   RBC / HPF 0-5 0 - 5 RBC/hpf   WBC, UA 6-10 0 - 5 WBC/hpf   Bacteria, UA RARE (A) NONE SEEN   Squamous Epithelial / LPF 0-5 0 - 5   Mucus PRESENT    Non Squamous Epithelial 0-5 (A) NONE SEEN    Comment: Performed at [facility], [place] [address] [place], [place] [address]   Overnight EEG with video  Result Date: 02/25/2022 Lora Havens, MD     02/25/2022  8:37 AM Patient Name: Charlene [name] MRN: [id number] Epilepsy Attending: Lora Havens Referring Physician/Provider: Amie [place], MD Duration: 02/24/2022 2333 to 02/25/2022 0845  Patient history: 56 year old with prior right thalamic ICH, IVH with ensuing left-sided spastic hemiparesis, resident of a SNF with a poor modified Rankin score at baseline, presenting with what appears to be new onset seizure. EEG to evaluate for seizure  Level of alertness: Awake, asleep  AEDs during EEG study: LEV  Technical aspects: This EEG study was done with scalp electrodes positioned according to the 10-20 International system of electrode placement. Electrical activity was reviewed with band pass filter of 1-'70Hz' , sensitivity of 7 uV/mm, display speed of 38m/sec with a '60Hz'  notched filter applied as appropriate. EEG data were recorded continuously and digitally stored.  Video monitoring was available and reviewed as appropriate.  Description: The posterior dominant rhythm consists of 9 Hz activity of moderate voltage (25-35 uV) seen predominantly in posterior head regions, symmetric and reactive to eye opening and eye closing.  Sleep was characterized by vertex waves, sleep spindles (12 to 14 Hz), maximal frontocentral region.  EEG showed intermittent generalized and lateralized right hemisphere 3 to 6 Hz theta-delta slowing.  Hyperventilation and photic stimulation were not performed.   ABNORMALITY -Intermittent slow, generalized and  lateralized right hemisphere  IMPRESSION: This study is suggestive of cortical dysfunction in right hemisphere likely secondary to underlying stroke as well as mild diffuse encephalopathy, nonspecific etiology. No seizures or epileptiform discharges were seen throughout the recording.  PLora Havens  EEG adult  Result Date: 02/24/2022 YLora Havens MD     02/25/2022  8:29 AM Patient Name: Charlene BROOKMRN: [id number] Attending: PLora HavensReferring Physician/Provider: AAmie Portland MD Date: 02/24/2022 Duration: 29.04 mins Patient history: 56year old with prior right thalamic ICH, IVH with ensuing left-sided spastic hemiparesis, resident of a SNF with a poor modified Rankin score at baseline, presenting with what appears to be new onset seizure. EEG to evaluate for seizure Level of alertness: Awake, asleep AEDs during EEG study: LEV Technical aspects: This EEG study was done with scalp electrodes positioned according to the 10-20 International system of electrode placement. Electrical activity was reviewed with band pass filter of 1-'70Hz' , sensitivity of 7 uV/mm, display speed of 368msec with a '60Hz'  notched filter applied  as appropriate. EEG data were recorded continuously and digitally stored.  Video monitoring was available and reviewed as appropriate. Description: The posterior dominant rhythm consists of 9 Hz activity of moderate voltage (25-35 uV) seen predominantly in posterior head regions, symmetric and reactive to eye opening and eye closing.  Sleep was characterized by vertex waves, sleep spindles (12 to 14 Hz), maximal frontocentral region.  Hyperventilation and photic stimulation were not performed.   IMPRESSION: This study is within normal limits. No seizures or epileptiform discharges were seen throughout the recording. A normal interictal EEG does not exclude nor support the diagnosis of epilepsy. Lora Havens   CT HEAD WO CONTRAST  Result Date:  02/24/2022 CLINICAL DATA:  Mental status change, unknown cause EXAM: CT HEAD WITHOUT CONTRAST TECHNIQUE: Contiguous axial images were obtained from the base of the skull through the vertex without intravenous contrast. RADIATION DOSE REDUCTION: This exam was performed according to the departmental dose-optimization program which includes automated exposure control, adjustment of the mA and/or kV according to patient size and/or use of iterative reconstruction technique. COMPARISON:  04/08/2021 FINDINGS: Brain: Old right thalamic and basal ganglia lacunar infarcts. Encephalomalacia/gliosis involving the right corona radiata and inferior right temporal lobe. No acute intracranial abnormality. Specifically, no hemorrhage, hydrocephalus, mass lesion, acute infarction, or significant intracranial injury. Vascular: No hyperdense vessel or unexpected calcification. Skull: No acute calvarial abnormality. Sinuses/Orbits: No acute findings Other: None IMPRESSION: Chronic changes as above, stable since prior study. No acute intracranial abnormality. Electronically Signed   By: Rolm Baptise M.D.   On: 02/24/2022 21:25   DG Chest Port 1 View  Result Date: 02/24/2022 CLINICAL DATA:  Altered mental status.  Code STEMI EXAM: PORTABLE CHEST 1 VIEW COMPARISON:  One-view chest x-ray 03/25/2021 FINDINGS: Tracheostomy tube was removed. Heart size is normal. Mild pulmonary vascular congestion is present without frank edema. No focal airspace consolidation is present. Asymmetric scarring is again noted at the left apex. The visualized soft tissues and bony thorax are unremarkable. IMPRESSION: 1. Mild pulmonary vascular congestion without frank edema. 2. Stable asymmetric scarring at the left apex. Electronically Signed   By: San Morelle M.D.   On: 02/24/2022 20:08    Pending Labs Unresulted Labs (From admission, onward)     Start     Ordered   03/03/22 0500  Creatinine, serum  (enoxaparin (LOVENOX)    CrCl >/= 30  ml/min)  Weekly,   R     Comments: while on enoxaparin therapy    02/24/22 2228   02/25/22 0035  Blood gas, arterial  Once,   R        02/25/22 0035            Vitals/Pain Today's Vitals   02/25/22 1300 02/25/22 1315 02/25/22 1330 02/25/22 1403  BP: 122/83  115/77 105/77  Pulse: 92 98 92 88  Resp: (!) '23 16 19 15  ' Temp:      TempSrc:      SpO2: 100% 97% 99% 99%  Weight:      Height:        Isolation Precautions No active isolations  Medications Medications  levETIRAcetam (KEPPRA) IVPB 500 mg/100 mL premix (0 mg Intravenous Stopped 02/25/22 1326)  amLODipine (NORVASC) tablet 10 mg (10 mg Per Tube Given 02/25/22 1134)  atorvastatin (LIPITOR) tablet 40 mg (40 mg Per Tube Given 02/25/22 1134)  doxazosin (CARDURA) tablet 2 mg (has no administration in time range)  magic mouthwash (has no administration in time range)  pantoprazole (PROTONIX)  2 mg/mL oral suspension 40 mg (40 mg Per Tube Given 02/25/22 1241)  free water 200 mL (200 mLs Per Tube Not Given 02/25/22 1253)  baclofen (LIORESAL) tablet 5 mg (5 mg Per Tube Given 02/24/22 2327)  feeding supplement (ENSURE ENLIVE / ENSURE PLUS) liquid 237 mL (237 mLs Per Tube Given 02/25/22 1241)  multivitamin with minerals tablet 1 tablet (1 tablet Per Tube Given 02/25/22 1134)  enoxaparin (LOVENOX) injection 40 mg (40 mg Subcutaneous Given 02/25/22 1134)  chlorhexidine gluconate (MEDLINE KIT) (PERIDEX) 0.12 % solution 15 mL (15 mLs Mouth Rinse Not Given 02/25/22 1045)  Oral care mouth rinse (15 mLs Mouth Rinse Not Given 02/25/22 1240)  LORazepam (ATIVAN) injection 2 mg (has no administration in time range)  sodium chloride flush (NS) 0.9 % injection 3 mL (3 mLs Intravenous Given 02/25/22 1136)  acetaminophen (TYLENOL) tablet 650 mg ( Oral See Alternative 02/25/22 1327)    Or  acetaminophen (TYLENOL) suppository 650 mg (650 mg Rectal Given 02/25/22 1327)  polyethylene glycol (MIRALAX / GLYCOLAX) packet 17 g (has no administration in time range)   LORazepam (ATIVAN) injection 1 mg (1 mg Intravenous Given 02/24/22 2023)  levETIRAcetam (KEPPRA) IVPB 1000 mg/100 mL premix (0 mg Intravenous Stopped 02/24/22 2304)    Mobility non-ambulatory High fall risk   Focused Assessments    R Recommendations: See Admitting Provider Note  Report given to:   Additional Notes: non verbal, not following commands

## 2022-02-25 NOTE — Progress Notes (Signed)
Initial Nutrition Assessment  DOCUMENTATION CODES:   Not applicable  INTERVENTION:  Initiate TF via PEG Osmolite 1.5 @ 52mL/h (1.2L/d) Start at 59mL and advance by 1mL q4h to goal free water flush q4h  This will provide 1800 kcal, 75g of protein, and of free water  NUTRITION DIAGNOSIS:  Inadequate oral intake related to dysphagia as evidenced by NPO status.  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 Nonverbal at baseline with PEG for nutrition.  Pt just arriving to room at the time of assessment, unable to evaluate nutrition hx. Will defer physical exam to follow-up.  Pt on TF via PEG at baseline. Unclear of home regimen, but reviewed home medications and noted that pt receives Isosource 1.5 at facility with a combination of nocturnal and bolus feeds. While inpatient, will place pt on continuous feeds and adjust as she nears being discharge ready.  Nutritionally Relevant Medications: Scheduled Meds:  atorvastatin  40 mg Per Tube Daily   baclofen  5 mg Per Tube QHS   doxazosin  2 mg Per Tube Daily   feeding supplement  237 mL Per Tube TID BM   free water  200 mL Per Tube Q4H   multivitamin with minerals  1 tablet Per Tube Daily   pantoprazole  40 mg Per Tube Daily   Continuous Infusions:  feeding supplement (OSMOLITE 1.5 CAL)     Labs Reviewed: K 3.3  NUTRITION - FOCUSED PHYSICAL EXAM: Unable to assess, defer to follow-up  Diet Order:   Diet Order             Diet NPO time specified  Diet effective now                   EDUCATION NEEDS:  Not appropriate for education at this time  Skin:  Skin Assessment: Reviewed RN Assessment  Last BM:  8/11 - type 7  Height:  Ht Readings from Last 1 Encounters:  02/25/22  5\' 5"  (1.651 m)    Weight:  Wt Readings from Last 1 Encounters:  02/25/22 63.5 kg    Ideal Body Weight:  56.8 kg  BMI:  Body mass index is 23.3 kg/m.  Estimated Nutritional Needs:  Kcal:  1600-1800 kcal/d Protein:  75-95 g/d Fluid:  >/=1.8L/d   04/27/22, RD, LDN Clinical Dietitian RD pager # available in [facility]  After hours/weekend pager # available in [facility]

## 2022-02-25 NOTE — Progress Notes (Signed)
EEG D/C'd. Patient had no skin breakdown.

## 2022-02-25 NOTE — Progress Notes (Addendum)
LTM EEG hooked up and running - no initial skin breakdown - push button tested - neuro notified.  

## 2022-02-25 NOTE — Progress Notes (Signed)
Subjective: Brief review of HPI:  Charlene [name] is a 56 y.o. 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  Reportedly had some nausea and diarrhea yesterday and since then has been more altered.  At baseline she is nonverbal and has left-sided weakness from the prior intracerebral hemorrhage but became completely unresponsive.  There was some question of rightward gaze deviation that was forced as well as lipsmacking.  She was given 1 dose of benzodiazepines which broke the gaze and the lipsmacking movements but she remained drowsy and lethargic. Husband later came at bedside.  He reports that the best mental status that she has had over the past few months is where she is awake, she is able to follow conversations as far as he can tell, she mumbles some words and is able to write things more clearly off-and-on. She has trickle movement in the left upper extremity and increased tone in bilateral lower extremities for which she gets physical therapy. Husband also reports dental extractions a few weeks ago after which she was given tramadol and had been constipated after.  Today AM the patient is lying comfortably in bed with EEG leads hooked up. She is awake and nonverbal, not following commands.   Objective: Current vital signs: BP 120/84   Pulse 75   Temp (!) 97.1 F (36.2 C) (Axillary)   Resp (!) 21   Ht '5\' 5"'  (1.651 m)   Wt 63.5 kg   LMP 03/03/2014   SpO2 100%   BMI 23.30 kg/m  Vital signs in last 24 hours: Temp:  [97.1 F (36.2 C)-97.5 F (36.4 C)] 97.1 F (36.2 C) (08/11 0629) Pulse Rate:  [72-121] 75 (08/11 0630) Resp:  [12-22] 21 (08/11 0630) BP: (95-125)/(67-92) 120/84 (08/11 0630) SpO2:  [96 %-100 %] 100 % (08/11 0630) Weight:  [63.5 kg] 63.5 kg (08/11 0100)  Intake/Output from previous day: No intake/output data  recorded. Intake/Output this shift: No intake/output data recorded. Nutritional status:  Diet Order             Diet NPO time specified  Diet effective now                  HEENT: [place]/AT. EEG leads in place. Lungs: Respirations unlabored Ext: No edema  Neurologic Exam: Ment: Awakens to a fully alert appearing state after opening eyes to voice. Nonverbal with a blank stare. Does not follow commands.  Cranial nerves: PERRL. Eyes are conjugate and at the midline. Does not fixate or track but does blink to threat in temporal visual fields bilaterally. Subtle left lower facial weakness. Motor/Sensory: Severely spastic left upper extremity with flexion contracture at elbow and wrist, with minimal movement to noxious stimulation.  Increased tone in the left lower extremity with somewhat more movement compared to the arm to noxious stimulation.  Right upper extremity she is able to hold antigravity.  Right lower extremity also has increased tone with extended/plantarflexed toes with increased tone. Reflexes: Consistent with her spasticity Cerebellar/Gait: Unable to assess Other: No jerking, twitching, myoclonus, facial automatisms or other clinical seizure-like activity seen.   Lab Results: Results for orders placed or performed during the hospital encounter of 02/24/22 (from the past 48 hour(s))  Resp Panel by RT-PCR (Flu A&B, Covid) Anterior Nasal Swab     Status: None   Collection Time: 02/24/22  7:50 PM  Specimen: Anterior Nasal Swab  Result Value Ref Range   SARS Coronavirus 2 by RT PCR NEGATIVE NEGATIVE    Comment: (NOTE) SARS-CoV-2 target nucleic acids are NOT DETECTED.  The SARS-CoV-2 RNA is generally detectable in upper respiratory specimens during the acute phase of infection. The lowest concentration of SARS-CoV-2 viral copies this assay can detect is 138 copies/mL. A negative result does not preclude SARS-Cov-2 infection and should not be used as the sole basis for  treatment or other patient management decisions. A negative result may occur with  improper specimen collection/handling, submission of specimen other than nasopharyngeal swab, presence of viral mutation(s) within the areas targeted by this assay, and inadequate number of viral copies(<138 copies/mL). A negative result must be combined with clinical observations, patient history, and epidemiological information. The expected result is Negative.  Fact Sheet for Patients:  EntrepreneurPulse.com.au  Fact Sheet for Healthcare Providers:  IncredibleEmployment.be  This test is no t yet approved or cleared by the [place] FDA and  has been authorized for detection and/or diagnosis of SARS-CoV-2 by FDA under an Emergency Use Authorization (EUA). This EUA will remain  in effect (meaning this test can be used) for the duration of the COVID-19 declaration under Section 564(b)(1) of the Act, 21 U.S.C.section 360bbb-3(b)(1), unless the authorization is terminated  or revoked sooner.       Influenza A by PCR NEGATIVE NEGATIVE   Influenza B by PCR NEGATIVE NEGATIVE    Comment: (NOTE) The Xpert Xpress SARS-CoV-2/FLU/RSV plus assay is intended as an aid in the diagnosis of influenza from Nasopharyngeal swab specimens and should not be used as a sole basis for treatment. Nasal washings and aspirates are unacceptable for Xpert Xpress SARS-CoV-2/FLU/RSV testing.  Fact Sheet for Patients: EntrepreneurPulse.com.au  Fact Sheet for Healthcare Providers: IncredibleEmployment.be  This test is not yet approved or cleared by the [place] FDA and has been authorized for detection and/or diagnosis of SARS-CoV-2 by FDA under an Emergency Use Authorization (EUA). This EUA will remain in effect (meaning this test can be used) for the duration of the COVID-19 declaration under Section 564(b)(1) of the Act, 21 U.S.C. section  360bbb-3(b)(1), unless the authorization is terminated or revoked.  Performed at [facility], [place] [address], [place], [place] [address]   Ethanol     Status: None   Collection Time: 02/24/22  7:54 PM  Result Value Ref Range   Alcohol, Ethyl (B) <10 <10 mg/dL    Comment: (NOTE) Lowest detectable limit for serum alcohol is 10 mg/dL.  For medical purposes only. Performed at [facility], [place] [address], [place], [place] [address]   Protime-INR     Status: None   Collection Time: 02/24/22  7:54 PM  Result Value Ref Range   Prothrombin Time 14.5 11.4 - 15.2 seconds   INR 1.1 0.8 - 1.2    Comment: (NOTE) INR goal varies based on device and disease states. Performed at [facility], [place] [address] [place], [place] [address]   APTT     Status: None   Collection Time: 02/24/22  7:54 PM  Result Value Ref Range   aPTT 29 24 - 36 seconds    Comment: Performed at [facility] [address] [place], [place] [address]  CBC     Status: Abnormal   Collection Time: 02/24/22  7:54 PM  Result Value Ref Range   WBC 7.0 4.0 - 10.5 K/uL   RBC 3.91 3.87 -  5.11 MIL/uL   Hemoglobin 9.8 (L) 12.0 - 15.0 g/dL   HCT 31.6 (L) 36.0 - 46.0 %   MCV 80.8 80.0 - 100.0 fL   MCH 25.1 (L) 26.0 - 34.0 pg   MCHC 31.0 30.0 - 36.0 g/dL   RDW 14.0 11.5 - 15.5 %   Platelets 228 150 - 400 K/uL   nRBC 0.0 0.0 - 0.2 %    Comment: Performed at [facility] [address], [place], [place] [address]  Differential     Status: None   Collection Time: 02/24/22  7:54 PM  Result Value Ref Range   Neutrophils Relative % 57 %   Neutro Abs 4.0 1.7 - 7.7 K/uL   Lymphocytes Relative 36 %   Lymphs Abs 2.5 0.7 - 4.0 K/uL   Monocytes Relative 6 %   Monocytes Absolute 0.4 0.1 - 1.0 K/uL   Eosinophils Relative 1 %   Eosinophils Absolute 0.0 0.0 - 0.5 K/uL   Basophils Relative 0 %   Basophils Absolute 0.0 0.0 - 0.1 K/uL   Immature Granulocytes 0 %   Abs Immature Granulocytes 0.02 0.00  - 0.07 K/uL    Comment: Performed at [facility], [place] [address], [place], [place] [address]  Comprehensive metabolic panel     Status: Abnormal   Collection Time: 02/24/22  7:54 PM  Result Value Ref Range   Sodium 140 135 - 145 mmol/L   Potassium 3.6 3.5 - 5.1 mmol/L   Chloride 107 98 - 111 mmol/L   CO2 26 22 - 32 mmol/L   Glucose, Bld 96 70 - 99 mg/dL    Comment: Glucose reference range applies only to samples taken after fasting for at least 8 hours.   BUN 20 6 - 20 mg/dL   Creatinine, Ser 0.51 0.44 - 1.00 mg/dL   Calcium 9.0 8.9 - 10.3 mg/dL   Total Protein 6.3 (L) 6.5 - 8.1 g/dL   Albumin 3.1 (L) 3.5 - 5.0 g/dL   AST 16 15 - 41 U/L   ALT 17 0 - 44 U/L   Alkaline Phosphatase 66 38 - 126 U/L   Total Bilirubin 0.3 0.3 - 1.2 mg/dL   GFR, Estimated >60 >60 mL/min    Comment: (NOTE) Calculated using the CKD-EPI Creatinine Equation (2021)    Anion gap 7 5 - 15    Comment: Performed at [facility], [place] [address] [place], [place] [address]  Troponin I (High Sensitivity)     Status: None   Collection Time: 02/24/22  7:54 PM  Result Value Ref Range   Troponin I (High Sensitivity) 4 <18 ng/L    Comment: (NOTE) Elevated high sensitivity troponin I (hsTnI) values and significant  changes across serial measurements may suggest ACS but many other  chronic and acute conditions are known to elevate hsTnI results.  Refer to the "Links" section for chest pain algorithms and additional  guidance. Performed at [facility], [place] [address], [place], [place] [address]   I-Stat beta hCG blood, ED     Status: None   Collection Time: 02/24/22  8:07 PM  Result Value Ref Range   I-stat hCG, quantitative <5.0 <5 mIU/mL   Comment 3            Comment:   GEST. AGE      CONC.  (mIU/mL)   <=1 WEEK        5 - 50     2  WEEKS       50 - 500     3 WEEKS       100 - 10,000     4 WEEKS     1,000 - 30,000        FEMALE AND NON-PREGNANT FEMALE:     LESS THAN 5 mIU/mL   I-stat chem  8, ED     Status: Abnormal   Collection Time: 02/24/22  8:08 PM  Result Value Ref Range   Sodium 141 135 - 145 mmol/L   Potassium 3.5 3.5 - 5.1 mmol/L   Chloride 104 98 - 111 mmol/L   BUN 21 (H) 6 - 20 mg/dL   Creatinine, Ser 0.50 0.44 - 1.00 mg/dL   Glucose, Bld 89 70 - 99 mg/dL    Comment: Glucose reference range applies only to samples taken after fasting for at least 8 hours.   Calcium, Ion 1.18 1.15 - 1.40 mmol/L   TCO2 26 22 - 32 mmol/L   Hemoglobin 9.9 (L) 12.0 - 15.0 g/dL   HCT 29.0 (L) 36.0 - 46.0 %  Troponin I (High Sensitivity)     Status: None   Collection Time: 02/24/22 10:42 PM  Result Value Ref Range   Troponin I (High Sensitivity) 3 <18 ng/L    Comment: (NOTE) Elevated high sensitivity troponin I (hsTnI) values and significant  changes across serial measurements may suggest ACS but many other  chronic and acute conditions are known to elevate hsTnI results.  Refer to the "Links" section for chest pain algorithms and additional  guidance. Performed at [facility], [place] [address] [place], [place] [address]   HIV Antibody (routine testing w rflx)     Status: None   Collection Time: 02/24/22 10:42 PM  Result Value Ref Range   HIV Screen 4th Generation wRfx Non Reactive Non Reactive    Comment: Performed at [facility], [place] [address], [place], [place] [address]  I-Stat arterial blood gas, ED     Status: Abnormal   Collection Time: 02/25/22 12:40 AM  Result Value Ref Range   pH, Arterial 7.401 7.35 - 7.45   pCO2 arterial 45.5 32 - 48 mmHg   pO2, Arterial 88 83 - 108 mmHg   Bicarbonate 28.4 (H) 20.0 - 28.0 mmol/L   TCO2 30 22 - 32 mmol/L   O2 Saturation 97 %   Acid-Base Excess 3.0 (H) 0.0 - 2.0 mmol/L   Sodium 142 135 - 145 mmol/L   Potassium 3.2 (L) 3.5 - 5.1 mmol/L   Calcium, Ion 1.29 1.15 - 1.40 mmol/L   HCT 29.0 (L) 36.0 - 46.0 %   Hemoglobin 9.9 (L) 12.0 - 15.0 g/dL   Patient temperature 98.0 F    Collection site RADIAL, ALLEN'S TEST  ACCEPTABLE    Drawn by RT    Sample type ARTERIAL   Comprehensive metabolic panel     Status: Abnormal   Collection Time: 02/25/22  3:46 AM  Result Value Ref Range   Sodium 140 135 - 145 mmol/L   Potassium 3.3 (L) 3.5 - 5.1 mmol/L   Chloride 106 98 - 111 mmol/L   CO2 26 22 - 32 mmol/L   Glucose, Bld 87 70 - 99 mg/dL    Comment: Glucose reference range applies only to samples taken after fasting for at least 8 hours.   BUN 16 6 - 20 mg/dL   Creatinine, Ser 0.51 0.44 - 1.00 mg/dL   Calcium 9.1 8.9 -  10.3 mg/dL   Total Protein 6.3 (L) 6.5 - 8.1 g/dL   Albumin 3.1 (L) 3.5 - 5.0 g/dL   AST 17 15 - 41 U/L   ALT 17 0 - 44 U/L   Alkaline Phosphatase 70 38 - 126 U/L   Total Bilirubin 0.5 0.3 - 1.2 mg/dL   GFR, Estimated >60 >60 mL/min    Comment: (NOTE) Calculated using the CKD-EPI Creatinine Equation (2021)    Anion gap 8 5 - 15    Comment: Performed at [facility] [address], [place], [place] [address]  CBC     Status: Abnormal   Collection Time: 02/25/22  3:46 AM  Result Value Ref Range   WBC 6.2 4.0 - 10.5 K/uL   RBC 3.78 (L) 3.87 - 5.11 MIL/uL   Hemoglobin 9.5 (L) 12.0 - 15.0 g/dL   HCT 30.3 (L) 36.0 - 46.0 %   MCV 80.2 80.0 - 100.0 fL   MCH 25.1 (L) 26.0 - 34.0 pg   MCHC 31.4 30.0 - 36.0 g/dL   RDW 14.1 11.5 - 15.5 %   Platelets 222 150 - 400 K/uL   nRBC 0.0 0.0 - 0.2 %    Comment: Performed at [facility], [place] [address], [place], [place] [address]  Respiratory (~20 pathogens) panel by PCR     Status: None   Collection Time: 02/25/22  4:42 AM   Specimen: Nasopharyngeal Swab; Respiratory  Result Value Ref Range   Adenovirus NOT DETECTED NOT DETECTED   Coronavirus 229E NOT DETECTED NOT DETECTED    Comment: (NOTE) The Coronavirus on the Respiratory Panel, DOES NOT test for the novel  Coronavirus (2019 nCoV)    Coronavirus HKU1 NOT DETECTED NOT DETECTED   Coronavirus NL63 NOT DETECTED NOT DETECTED   Coronavirus OC43 NOT DETECTED NOT DETECTED    Metapneumovirus NOT DETECTED NOT DETECTED   Rhinovirus / Enterovirus NOT DETECTED NOT DETECTED   Influenza A NOT DETECTED NOT DETECTED   Influenza B NOT DETECTED NOT DETECTED   Parainfluenza Virus 1 NOT DETECTED NOT DETECTED   Parainfluenza Virus 2 NOT DETECTED NOT DETECTED   Parainfluenza Virus 3 NOT DETECTED NOT DETECTED   Parainfluenza Virus 4 NOT DETECTED NOT DETECTED   Respiratory Syncytial Virus NOT DETECTED NOT DETECTED   Bordetella pertussis NOT DETECTED NOT DETECTED   Bordetella Parapertussis NOT DETECTED NOT DETECTED   Chlamydophila pneumoniae NOT DETECTED NOT DETECTED   Mycoplasma pneumoniae NOT DETECTED NOT DETECTED    Comment: Performed at [facility], [place] [address], [place], [place] [address]  Urine rapid drug screen (hosp performed)     Status: None   Collection Time: 02/25/22  7:30 AM  Result Value Ref Range   Opiates NONE DETECTED NONE DETECTED   Cocaine NONE DETECTED NONE DETECTED   Benzodiazepines NONE DETECTED NONE DETECTED   Amphetamines NONE DETECTED NONE DETECTED   Tetrahydrocannabinol NONE DETECTED NONE DETECTED   Barbiturates NONE DETECTED NONE DETECTED    Comment: (NOTE) DRUG SCREEN FOR MEDICAL PURPOSES ONLY.  IF CONFIRMATION IS NEEDED FOR ANY PURPOSE, NOTIFY LAB WITHIN 5 DAYS.  LOWEST DETECTABLE LIMITS FOR URINE DRUG SCREEN Drug Class                     Cutoff (ng/mL) Amphetamine and metabolites    1000 Barbiturate and metabolites    200 Benzodiazepine                 379 Tricyclics and  metabolites     300 Opiates and metabolites        300 Cocaine and metabolites        300 THC                            50 Performed at [facility], [place] [address] [place], [place] [address]   Urinalysis, Routine w reflex microscopic     Status: Abnormal   Collection Time: 02/25/22  7:30 AM  Result Value Ref Range   Color, Urine STRAW (A) YELLOW   APPearance CLEAR CLEAR   Specific Gravity, Urine 1.006 1.005 - 1.030   pH 6.0 5.0 - 8.0    Glucose, UA NEGATIVE NEGATIVE mg/dL   Hgb urine dipstick NEGATIVE NEGATIVE   Bilirubin Urine NEGATIVE NEGATIVE   Ketones, ur NEGATIVE NEGATIVE mg/dL   Protein, ur NEGATIVE NEGATIVE mg/dL   Nitrite NEGATIVE NEGATIVE   Leukocytes,Ua TRACE (A) NEGATIVE   RBC / HPF 0-5 0 - 5 RBC/hpf   WBC, UA 6-10 0 - 5 WBC/hpf   Bacteria, UA RARE (A) NONE SEEN   Squamous Epithelial / LPF 0-5 0 - 5   Mucus PRESENT    Non Squamous Epithelial 0-5 (A) NONE SEEN    Comment: Performed at [facility], [place] [address], [place], [place] [address]    Recent Results (from the past 240 hour(s))  Resp Panel by RT-PCR (Flu A&B, Covid) Anterior Nasal Swab     Status: None   Collection Time: 02/24/22  7:50 PM   Specimen: Anterior Nasal Swab  Result Value Ref Range Status   SARS Coronavirus 2 by RT PCR NEGATIVE NEGATIVE Final    Comment: (NOTE) SARS-CoV-2 target nucleic acids are NOT DETECTED.  The SARS-CoV-2 RNA is generally detectable in upper respiratory specimens during the acute phase of infection. The lowest concentration of SARS-CoV-2 viral copies this assay can detect is 138 copies/mL. A negative result does not preclude SARS-Cov-2 infection and should not be used as the sole basis for treatment or other patient management decisions. A negative result may occur with  improper specimen collection/handling, submission of specimen other than nasopharyngeal swab, presence of viral mutation(s) within the areas targeted by this assay, and inadequate number of viral copies(<138 copies/mL). A negative result must be combined with clinical observations, patient history, and epidemiological information. The expected result is Negative.  Fact Sheet for Patients:  EntrepreneurPulse.com.au  Fact Sheet for Healthcare Providers:  IncredibleEmployment.be  This test is no t yet approved or cleared by the [place] FDA and  has been authorized for detection and/or diagnosis  of SARS-CoV-2 by FDA under an Emergency Use Authorization (EUA). This EUA will remain  in effect (meaning this test can be used) for the duration of the COVID-19 declaration under Section 564(b)(1) of the Act, 21 U.S.C.section 360bbb-3(b)(1), unless the authorization is terminated  or revoked sooner.       Influenza A by PCR NEGATIVE NEGATIVE Final   Influenza B by PCR NEGATIVE NEGATIVE Final    Comment: (NOTE) The Xpert Xpress SARS-CoV-2/FLU/RSV plus assay is intended as an aid in the diagnosis of influenza from Nasopharyngeal swab specimens and should not be used as a sole basis for treatment. Nasal washings and aspirates are unacceptable for Xpert Xpress SARS-CoV-2/FLU/RSV testing.  Fact Sheet for Patients: EntrepreneurPulse.com.au  Fact Sheet for Healthcare Providers: IncredibleEmployment.be  This test is not yet approved or cleared by the [place]  FDA and has been authorized for detection and/or diagnosis of SARS-CoV-2 by FDA under an Emergency Use Authorization (EUA). This EUA will remain in effect (meaning this test can be used) for the duration of the COVID-19 declaration under Section 564(b)(1) of the Act, 21 U.S.C. section 360bbb-3(b)(1), unless the authorization is terminated or revoked.  Performed at [facility], [place] [address], [place], [place] [address]   Respiratory (~20 pathogens) panel by PCR     Status: None   Collection Time: 02/25/22  4:42 AM   Specimen: Nasopharyngeal Swab; Respiratory  Result Value Ref Range Status   Adenovirus NOT DETECTED NOT DETECTED Final   Coronavirus 229E NOT DETECTED NOT DETECTED Final    Comment: (NOTE) The Coronavirus on the Respiratory Panel, DOES NOT test for the novel  Coronavirus (2019 nCoV)    Coronavirus HKU1 NOT DETECTED NOT DETECTED Final   Coronavirus NL63 NOT DETECTED NOT DETECTED Final   Coronavirus OC43 NOT DETECTED NOT DETECTED Final   Metapneumovirus NOT  DETECTED NOT DETECTED Final   Rhinovirus / Enterovirus NOT DETECTED NOT DETECTED Final   Influenza A NOT DETECTED NOT DETECTED Final   Influenza B NOT DETECTED NOT DETECTED Final   Parainfluenza Virus 1 NOT DETECTED NOT DETECTED Final   Parainfluenza Virus 2 NOT DETECTED NOT DETECTED Final   Parainfluenza Virus 3 NOT DETECTED NOT DETECTED Final   Parainfluenza Virus 4 NOT DETECTED NOT DETECTED Final   Respiratory Syncytial Virus NOT DETECTED NOT DETECTED Final   Bordetella pertussis NOT DETECTED NOT DETECTED Final   Bordetella Parapertussis NOT DETECTED NOT DETECTED Final   Chlamydophila pneumoniae NOT DETECTED NOT DETECTED Final   Mycoplasma pneumoniae NOT DETECTED NOT DETECTED Final    Comment: Performed at [facility], [place]. [address], [place], [place] [address]    Lipid Panel No results for input(s): "CHOL", "TRIG", "HDL", "CHOLHDL", "VLDL", "LDLCALC" in the last 72 hours.  Studies/Results: CT HEAD WO CONTRAST  Result Date: 02/24/2022 CLINICAL DATA:  Mental status change, unknown cause EXAM: CT HEAD WITHOUT CONTRAST TECHNIQUE: Contiguous axial images were obtained from the base of the skull through the vertex without intravenous contrast. RADIATION DOSE REDUCTION: This exam was performed according to the departmental dose-optimization program which includes automated exposure control, adjustment of the mA and/or kV according to patient size and/or use of iterative reconstruction technique. COMPARISON:  04/08/2021 FINDINGS: Brain: Old right thalamic and basal ganglia lacunar infarcts. Encephalomalacia/gliosis involving the right corona radiata and inferior right temporal lobe. No acute intracranial abnormality. Specifically, no hemorrhage, hydrocephalus, mass lesion, acute infarction, or significant intracranial injury. Vascular: No hyperdense vessel or unexpected calcification. Skull: No acute calvarial abnormality. Sinuses/Orbits: No acute findings Other: None IMPRESSION: Chronic  changes as above, stable since prior study. No acute intracranial abnormality. Electronically Signed   By: Rolm Baptise M.D.   On: 02/24/2022 21:25   DG Chest Port 1 View  Result Date: 02/24/2022 CLINICAL DATA:  Altered mental status.  Code STEMI EXAM: PORTABLE CHEST 1 VIEW COMPARISON:  One-view chest x-ray 03/25/2021 FINDINGS: Tracheostomy tube was removed. Heart size is normal. Mild pulmonary vascular congestion is present without frank edema. No focal airspace consolidation is present. Asymmetric scarring is again noted at the left apex. The visualized soft tissues and bony thorax are unremarkable. IMPRESSION: 1. Mild pulmonary vascular congestion without frank edema. 2. Stable asymmetric scarring at the left apex. Electronically Signed   By: San Morelle M.D.   On: 02/24/2022 20:08    Medications: Scheduled:  amLODipine  10 mg Per Tube Daily   atorvastatin  40 mg Per Tube Daily   baclofen  5 mg Per Tube QHS   carvedilol  25 mg Per Tube BID WC   chlorhexidine gluconate (MEDLINE KIT)  15 mL Mouth Rinse BID   doxazosin  2 mg Per Tube Daily   enoxaparin (LOVENOX) injection  40 mg Subcutaneous Q24H   feeding supplement  237 mL Per Tube TID BM   free water  200 mL Per Tube Q4H   modafinil  200 mg Per Tube Daily   multivitamin with minerals  1 tablet Per Tube Daily   mouth rinse  15 mL Mouth Rinse 10 times per day   pantoprazole  40 mg Per Tube Daily   sodium chloride flush  3 mL Intravenous Q12H   Continuous:  levETIRAcetam      Assessment: 56 year old female with prior right thalamic ICH, IVH with ensuing left-sided spastic hemiparesis, resident of a SNF with a poor modified Rankin score at baseline, presenting with what appears to be new onset seizure, likely related to the prior ICH/IVH. She was loaded with Keppra 1000 mg IV in the ED.  - Exam today: Unchanged from Dr. Johny Chess exam yesterday, except now more awake.  - LTM EEG report for today AM: Intermittent slow, generalized  and lateralized right hemisphere. This study is suggestive of cortical dysfunction in right hemisphere likely secondary to underlying stroke as well as mild diffuse encephalopathy, nonspecific etiology. No seizures or epileptiform discharges were seen throughout the recording. - CT head: Old right thalamic and basal ganglia lacunar infarcts. Encephalomalacia/gliosis involving the right corona radiata and inferior right temporal lobe. No acute intracranial abnormality. Specifically, no hemorrhage, hydrocephalus, mass lesion, acute infarction, or significant intracranial injury.      Recommendations: - Check labs to look for any provoking factors that might have lowered seizure threshold-check UA, chest x-ray. - Continue Keppra 500 twice daily - Seizure precautions - Discontinuing LTM EEG.   - MRI brain with and without contrast after LTM EEG leads are removed (ordered)  Addendum: - MRI brain w/wo contrast: No acute intracranial abnormality or significant interval change. Expected evolution of remote hemorrhagic infarct of the right thalamus and posterior lentiform nucleus. Remote lacunar infarcts of the left thalamus are new since the prior exam. Focal encephalomalacia along the undersurface of the right temporal lobe could be a seizure focus.  - Neurohospitalist service will sign off - Will need outpatient Neurology follow up after discharge     LOS: 0 days   '@Electronically'  signed: Dr. Kerney Elbe 02/25/2022  8:21 AM

## 2022-02-25 NOTE — Progress Notes (Signed)
PROGRESS NOTE    Charlene [name]  QVZ:[id number] DOB: 02-Oct-1965 DOA: 02/24/2022 PCP: Patient, [name]   Brief Narrative:  Charlene [name] is a 56 y.o. female with medical history significant of hypertension, anxiety, depression, hyperlipidemia, significant intracerebral hemorrhage with G-tube and urinary retention presenting with altered mental status.  Assessment & Plan:   Principal Problem:   Seizure ([place]) Active Problems:   Essential hypertension, benign   History of urinary retention-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   HLD (hyperlipidemia)   Acute metabolic encephalopathy, unclear etiology  Rule out seizure At risk for polypharmacy/sundowning -At baseline largely nonverbal able to track with eyes and write minimally due to history of severe stroke -Presents with new gaze decreased responsiveness and lipsmacking concerning for seizure -Neurology following, initial EEG negative at this point -prophylactically placed on Keppra at intake -defer to neuro on continuation or medication changes -likely pending findings of continuous EEG -Continue to monitor for polypharmacy, sundowning -No acute findings consistent with infection, reported single episode of fever outpatient  Hx of intracerebral hemorrhage -Chronic with notable significant left-sided functional deficits, nonverbal, can write occasionally and none to follow commands track movement at baseline  Urinary retention G-tube dependence - Continue doxazosin - Continue modafinil - Continue baclofen - Continue tube feeds and free water flushes  Hypertension - Continue home amlodipine, carvedilol - Hold off on hydralazine for now  Hyperlipidemia - Continue atorvastatin   DVT prophylaxis: Lovenox Code Status: Full Family Communication: None present  Status is: Inpatient  Dispo: The patient is from: Facility              Anticipated d/c is to: Same              Anticipated d/c date is: 48 to 72 hours              Patient  currently not medically stable for discharge  Consultants:  Neurology  Procedures:  None  Antimicrobials:  None  Subjective: No acute issues or events overnight; review of systems markedly limited given above  Objective: Vitals:   02/25/22 0530 02/25/22 0600 02/25/22 0629 02/25/22 0630  BP: 110/77 120/72  120/84  Pulse: 75 84  75  Resp: 12 15  (!) 21  Temp:   (!) 97.1 F (36.2 C)   TempSrc:   Axillary   SpO2: 100% 99%  100%  Weight:      Height:       No intake or output data in the 24 hours ending 02/25/22 0848 Filed Weights   02/25/22 0100  Weight: 63.5 kg    Examination:  General:  Pleasantly resting in bed, No acute distress.  Poorly interactive HEENT:  Normocephalic atraumatic.  Sclerae nonicteric, noninjected.  Extraocular movements intact bilaterally -tracking poorly. Neck:  Without mass or deformity.  Trachea is midline. Lungs:  Clear to auscultate bilaterally without rhonchi, wheeze, or rales. Heart:  Regular rate and rhythm.  Without murmurs, rubs, or gallops. Abdomen:  Soft, nontender, nondistended.  Without guarding or rebound. Extremities: Without cyanosis, clubbing, edema, left-sided deficits for Skin:  Warm and dry, no erythema  Data Reviewed: I have personally reviewed following labs and imaging studies  CBC: Recent Labs  Lab 02/24/22 1954 02/24/22 2008 02/25/22 0040 02/25/22 0346  WBC 7.0  --   --  6.2  NEUTROABS 4.0  --   --   --   HGB 9.8* 9.9* 9.9* 9.5*  HCT 31.6* 29.0* 29.0* 30.3*  MCV 80.8  --   --  80.2  PLT 228  --   --  370   Basic Metabolic Panel: Recent Labs  Lab 02/24/22 1954 02/24/22 2008 02/25/22 0040 02/25/22 0346  NA 140 141 142 140  K 3.6 3.5 3.2* 3.3*  CL 107 104  --  106  CO2 26  --   --  26  GLUCOSE 96 89  --  87  BUN 20 21*  --  16  CREATININE 0.51 0.50  --  0.51  CALCIUM 9.0  --   --  9.1   GFR: Estimated Creatinine Clearance: 72.3 mL/min (by C-G formula based on SCr of 0.51 mg/dL). Liver Function  Tests: Recent Labs  Lab 02/24/22 1954 02/25/22 0346  AST 16 17  ALT 17 17  ALKPHOS 66 70  BILITOT 0.3 0.5  PROT 6.3* 6.3*  ALBUMIN 3.1* 3.1*   No results for input(s): "LIPASE", "AMYLASE" in the last 168 hours. No results for input(s): "AMMONIA" in the last 168 hours. Coagulation Profile: Recent Labs  Lab 02/24/22 1954  INR 1.1   Cardiac Enzymes: No results for input(s): "CKTOTAL", "CKMB", "CKMBINDEX", "TROPONINI" in the last 168 hours. BNP (last 3 results) No results for input(s): "PROBNP" in the last 8760 hours. HbA1C: No results for input(s): "HGBA1C" in the last 72 hours. CBG: No results for input(s): "GLUCAP" in the last 168 hours. Lipid Profile: No results for input(s): "CHOL", "HDL", "LDLCALC", "TRIG", "CHOLHDL", "LDLDIRECT" in the last 72 hours. Thyroid Function Tests: No results for input(s): "TSH", "T4TOTAL", "FREET4", "T3FREE", "THYROIDAB" in the last 72 hours. Anemia Panel: No results for input(s): "VITAMINB12", "FOLATE", "FERRITIN", "TIBC", "IRON", "RETICCTPCT" in the last 72 hours. Sepsis Labs: No results for input(s): "PROCALCITON", "LATICACIDVEN" in the last 168 hours.  Recent Results (from the past 240 hour(s))  Resp Panel by RT-PCR (Flu A&B, Covid) Anterior Nasal Swab     Status: None   Collection Time: 02/24/22  7:50 PM   Specimen: Anterior Nasal Swab  Result Value Ref Range Status   SARS Coronavirus 2 by RT PCR NEGATIVE NEGATIVE Final    Comment: (NOTE) SARS-CoV-2 target nucleic acids are NOT DETECTED.  The SARS-CoV-2 RNA is generally detectable in upper respiratory specimens during the acute phase of infection. The lowest concentration of SARS-CoV-2 viral copies this assay can detect is 138 copies/mL. A negative result does not preclude SARS-Cov-2 infection and should not be used as the sole basis for treatment or other patient management decisions. A negative result may occur with  improper specimen collection/handling, submission of specimen  other than nasopharyngeal swab, presence of viral mutation(s) within the areas targeted by this assay, and inadequate number of viral copies(<138 copies/mL). A negative result must be combined with clinical observations, patient history, and epidemiological information. The expected result is Negative.  Fact Sheet for Patients:  EntrepreneurPulse.com.au  Fact Sheet for Healthcare Providers:  IncredibleEmployment.be  This test is no t yet approved or cleared by the [place] FDA and  has been authorized for detection and/or diagnosis of SARS-CoV-2 by FDA under an Emergency Use Authorization (EUA). This EUA will remain  in effect (meaning this test can be used) for the duration of the COVID-19 declaration under Section 564(b)(1) of the Act, 21 U.S.C.section 360bbb-3(b)(1), unless the authorization is terminated  or revoked sooner.       Influenza A by PCR NEGATIVE NEGATIVE Final   Influenza B by PCR NEGATIVE NEGATIVE Final    Comment: (NOTE) The Xpert Xpress SARS-CoV-2/FLU/RSV plus assay is intended as an aid in  the diagnosis of influenza from Nasopharyngeal swab specimens and should not be used as a sole basis for treatment. Nasal washings and aspirates are unacceptable for Xpert Xpress SARS-CoV-2/FLU/RSV testing.  Fact Sheet for Patients: EntrepreneurPulse.com.au  Fact Sheet for Healthcare Providers: IncredibleEmployment.be  This test is not yet approved or cleared by the [place] FDA and has been authorized for detection and/or diagnosis of SARS-CoV-2 by FDA under an Emergency Use Authorization (EUA). This EUA will remain in effect (meaning this test can be used) for the duration of the COVID-19 declaration under Section 564(b)(1) of the Act, 21 U.S.C. section 360bbb-3(b)(1), unless the authorization is terminated or revoked.  Performed at [facility], [place] [address] [place],  [place] [address]   Respiratory (~20 pathogens) panel by PCR     Status: None   Collection Time: 02/25/22  4:42 AM   Specimen: Nasopharyngeal Swab; Respiratory  Result Value Ref Range Status   Adenovirus NOT DETECTED NOT DETECTED Final   Coronavirus 229E NOT DETECTED NOT DETECTED Final    Comment: (NOTE) The Coronavirus on the Respiratory Panel, DOES NOT test for the novel  Coronavirus (2019 nCoV)    Coronavirus HKU1 NOT DETECTED NOT DETECTED Final   Coronavirus NL63 NOT DETECTED NOT DETECTED Final   Coronavirus OC43 NOT DETECTED NOT DETECTED Final   Metapneumovirus NOT DETECTED NOT DETECTED Final   Rhinovirus / Enterovirus NOT DETECTED NOT DETECTED Final   Influenza A NOT DETECTED NOT DETECTED Final   Influenza B NOT DETECTED NOT DETECTED Final   Parainfluenza Virus 1 NOT DETECTED NOT DETECTED Final   Parainfluenza Virus 2 NOT DETECTED NOT DETECTED Final   Parainfluenza Virus 3 NOT DETECTED NOT DETECTED Final   Parainfluenza Virus 4 NOT DETECTED NOT DETECTED Final   Respiratory Syncytial Virus NOT DETECTED NOT DETECTED Final   Bordetella pertussis NOT DETECTED NOT DETECTED Final   Bordetella Parapertussis NOT DETECTED NOT DETECTED Final   Chlamydophila pneumoniae NOT DETECTED NOT DETECTED Final   Mycoplasma pneumoniae NOT DETECTED NOT DETECTED Final    Comment: Performed at [facility], [place]. [address] [place], [place] [address]         Radiology Studies: Overnight EEG with video  Result Date: 02/25/2022 Lora Havens, MD     02/25/2022  8:37 AM Patient Name: REDONNA WILBERT MRN: [id number] Epilepsy Attending: Lora Havens Referring Physician/Provider: Amie Portland, MD Duration: 02/24/2022 2333 to 02/25/2022 0845  Patient history: 56 year old with prior right thalamic ICH, IVH with ensuing left-sided spastic hemiparesis, resident of a SNF with a poor modified Rankin score at baseline, presenting with what appears to be new onset seizure. EEG to evaluate for seizure  Level  of alertness: Awake, asleep  AEDs during EEG study: LEV  Technical aspects: This EEG study was done with scalp electrodes positioned according to the 10-20 International system of electrode placement. Electrical activity was reviewed with band pass filter of 1-_0 , sensitivity of 7 uV/mm, display speed of 88m/sec with a _1  notched filter applied as appropriate. EEG data were recorded continuously and digitally stored.  Video monitoring was available and reviewed as appropriate.  Description: The posterior dominant rhythm consists of 9 Hz activity of moderate voltage (25-35 uV) seen predominantly in posterior head regions, symmetric and reactive to eye opening and eye closing.  Sleep was characterized by vertex waves, sleep spindles (12 to 14 Hz), maximal frontocentral region.  EEG showed intermittent generalized and lateralized right hemisphere 3 to 6 Hz theta-delta slowing.  Hyperventilation  and photic stimulation were not performed.   ABNORMALITY -Intermittent slow, generalized and lateralized right hemisphere  IMPRESSION: This study is suggestive of cortical dysfunction in right hemisphere likely secondary to underlying stroke as well as mild diffuse encephalopathy, nonspecific etiology. No seizures or epileptiform discharges were seen throughout the recording.  Lora Havens   EEG adult  Result Date: 02/24/2022 Lora Havens, MD     02/25/2022  8:29 AM Patient Name: SHAWNTEL FARNWORTH MRN: [id number] Epilepsy Attending: Lora Havens Referring Physician/Provider: Amie Portland, MD Date: 02/24/2022 Duration: 29.04 mins Patient history: 56 year old with prior right thalamic ICH, IVH with ensuing left-sided spastic hemiparesis, resident of a SNF with a poor modified Rankin score at baseline, presenting with what appears to be new onset seizure. EEG to evaluate for seizure Level of alertness: Awake, asleep AEDs during EEG study: LEV Technical aspects: This EEG study was done with scalp electrodes  positioned according to the 10-20 International system of electrode placement. Electrical activity was reviewed with band pass filter of 1-_0 , sensitivity of 7 uV/mm, display speed of 53m/sec with a _1  notched filter applied as appropriate. EEG data were recorded continuously and digitally stored.  Video monitoring was available and reviewed as appropriate. Description: The posterior dominant rhythm consists of 9 Hz activity of moderate voltage (25-35 uV) seen predominantly in posterior head regions, symmetric and reactive to eye opening and eye closing.  Sleep was characterized by vertex waves, sleep spindles (12 to 14 Hz), maximal frontocentral region.  Hyperventilation and photic stimulation were not performed.   IMPRESSION: This study is within normal limits. No seizures or epileptiform discharges were seen throughout the recording. A normal interictal EEG does not exclude nor support the diagnosis of epilepsy. PLora Havens  CT HEAD WO CONTRAST  Result Date: 02/24/2022 CLINICAL DATA:  Mental status change, unknown cause EXAM: CT HEAD WITHOUT CONTRAST TECHNIQUE: Contiguous axial images were obtained from the base of the skull through the vertex without intravenous contrast. RADIATION DOSE REDUCTION: This exam was performed according to the departmental dose-optimization program which includes automated exposure control, adjustment of the mA and/or kV according to patient size and/or use of iterative reconstruction technique. COMPARISON:  04/08/2021 FINDINGS: Brain: Old right thalamic and basal ganglia lacunar infarcts. Encephalomalacia/gliosis involving the right corona radiata and inferior right temporal lobe. No acute intracranial abnormality. Specifically, no hemorrhage, hydrocephalus, mass lesion, acute infarction, or significant intracranial injury. Vascular: No hyperdense vessel or unexpected calcification. Skull: No acute calvarial abnormality. Sinuses/Orbits: No acute findings Other: None  IMPRESSION: Chronic changes as above, stable since prior study. No acute intracranial abnormality. Electronically Signed   By: KRolm BaptiseM.D.   On: 02/24/2022 21:25   DG Chest Port 1 View  Result Date: 02/24/2022 CLINICAL DATA:  Altered mental status.  Code STEMI EXAM: PORTABLE CHEST 1 VIEW COMPARISON:  One-view chest x-ray 03/25/2021 FINDINGS: Tracheostomy tube was removed. Heart size is normal. Mild pulmonary vascular congestion is present without frank edema. No focal airspace consolidation is present. Asymmetric scarring is again noted at the left apex. The visualized soft tissues and bony thorax are unremarkable. IMPRESSION: 1. Mild pulmonary vascular congestion without frank edema. 2. Stable asymmetric scarring at the left apex. Electronically Signed   By: CSan MorelleM.D.   On: 02/24/2022 20:08    Scheduled Meds:  amLODipine  10 mg Per Tube Daily   atorvastatin  40 mg Per Tube Daily   baclofen  5 mg Per Tube QHS   carvedilol  25 mg Per Tube BID WC   chlorhexidine gluconate (MEDLINE KIT)  15 mL Mouth Rinse BID   doxazosin  2 mg Per Tube Daily   enoxaparin (LOVENOX) injection  40 mg Subcutaneous Q24H   feeding supplement  237 mL Per Tube TID BM   free water  200 mL Per Tube Q4H   modafinil  200 mg Per Tube Daily   multivitamin with minerals  1 tablet Per Tube Daily   mouth rinse  15 mL Mouth Rinse 10 times per day   pantoprazole  40 mg Per Tube Daily   sodium chloride flush  3 mL Intravenous Q12H   Continuous Infusions:  levETIRAcetam      LOS: 0 days   Time spent: 57mn  Froilan Mclean C Titianna Loomis, DO [facility]  If 7PM-7AM, please contact night-coverage [email or web address]  02/25/2022, 8:48 AM

## 2022-02-26 DIAGNOSIS — R569 Unspecified convulsions: Secondary | ICD-10-CM | POA: Diagnosis not present

## 2022-02-26 LAB — GLUCOSE, CAPILLARY
Glucose-Capillary: 108 mg/dL — ABNORMAL HIGH (ref 70–99)
Glucose-Capillary: 109 mg/dL — ABNORMAL HIGH (ref 70–99)
Glucose-Capillary: 121 mg/dL — ABNORMAL HIGH (ref 70–99)
Glucose-Capillary: 121 mg/dL — ABNORMAL HIGH (ref 70–99)
Glucose-Capillary: 151 mg/dL — ABNORMAL HIGH (ref 70–99)

## 2022-02-26 MED ORDER — ORAL CARE MOUTH RINSE
15.0000 mL | OROMUCOSAL | Status: DC
  Administered 2022-02-26 – 2022-02-27 (×4): 15 mL via OROMUCOSAL

## 2022-02-26 MED ORDER — PNEUMOCOCCAL 20-VAL CONJ VACC 0.5 ML IM SUSY
0.5000 mL | PREFILLED_SYRINGE | INTRAMUSCULAR | Status: DC
  Filled 2022-02-26: qty 0.5

## 2022-02-26 MED ORDER — ORAL CARE MOUTH RINSE: 15.0000 mL | OROMUCOSAL | Status: DC | PRN

## 2022-02-26 NOTE — Evaluation (Signed)
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   Hypertension    Infective otitis externa of both ears    Middle ear effusion, bilateral 04/14/2021   Nausea & vomiting 02/21/2014   Tracheostomy dependent [facility])    Past Surgical History:  Past Surgical History:  Procedure Laterality Date   CESAREAN SECTION     ESOPHAGOGASTRODUODENOSCOPY N/A 09/10/2020   Procedure: ESOPHAGOGASTRODUODENOSCOPY (EGD);  Surgeon: Kinsinger, De Blanch, MD;  Location: [facility] ENDOSCOPY;  Service: General;  Laterality: N/A;   IR REPLACE G-TUBE SIMPLE WO FLUORO  07/30/2021   IR REPLC GASTRO/COLONIC TUBE PERCUT W/FLUORO  02/16/2021   IR REPLC GASTRO/COLONIC TUBE PERCUT W/FLUORO  10/23/2021   PEG PLACEMENT N/A 09/10/2020   Procedure: PERCUTANEOUS ENDOSCOPIC GASTROSTOMY (PEG) PLACEMENT;  Surgeon: Rodman Pickle, MD;  Location: [facility] ENDOSCOPY;  Service: General;  Laterality: N/A;   HPI:  Patient is a 56 y.o. female with PMH: HTN, anxiety, depression, HLD, significant intracerebral hemorrhage with G-tube who presented from SNF with AMS. Patient reportedly had some nausea and vomiting day before admission and facility staff noted she appeared altered. EMS was called and patient was brought to hospital on 02/24/22. In ED patietn was afebrile, CXR showed mild pulmonary vascular congestion with stable scarring, CT head shoqwed no acute abnormality, neurology consulted  recommending Keppra, EEG, MRI. MRI did not show any acute intracranial abnormalities.    Assessment / Plan / Recommendation  Clinical Impression  Patient presents with a moderately impaired oral phase of swallow and suspect mild-moderate pharyngeal phase. She demonstrated weak bilabial and lingual movement as well as decreased ROM of oral motor structures, leading to prolonged oral transit, anterior spillage of solids and liquids on right side of oral cavity as well as suspected swallow initiation delays. No cough response or other overt s/s that can be indicative of penetration or aspiration were observed. Patient does not vocalize or verbalize. Spouse provided information on how he works with patient for self-feeding and based on information he gave, SLP made a list of safe feeding information for staff. SLP is recommending patient start on Dys 1 solids (puree) and thin liquids. She was previously eating this diet at SNF and so likely will continue on this while here in hospital. SLP will follow for toleration. SLP Visit Diagnosis: Dysphagia, unspecified (R13.10)    Aspiration Risk  Mild aspiration risk    Diet Recommendation Thin liquid;Dysphagia 1 (Puree)   Liquid Administration via: Straw Medication Administration: Via alternative means Supervision: Full supervision/cueing for compensatory strategies;Staff to assist with self feeding Compensations: Slow rate;Small sips/bites;Minimize environmental distractions Postural Changes: Seated upright at 90 degrees    Other  Recommendations Oral Care Recommendations: Oral care BID;Staff/trained caregiver to provide oral care    Recommendations for follow up therapy are one component of a multi-disciplinary discharge planning process, led by the attending physician.  Recommendations may be updated based on patient status, additional functional criteria and insurance  authorization.  Follow up Recommendations Skilled nursing-short term rehab (<3  hours/day)      Assistance Recommended at Discharge Frequent or constant Supervision/Assistance  Functional Status Assessment Patient has had a recent decline in their functional status and demonstrates the ability to make significant improvements in function in a reasonable and predictable amount of time.  Frequency and Duration min 2x/week          Prognosis Prognosis for Safe Diet Advancement: Fair Barriers to Reach Goals: Time post onset      Swallow Study   General Date of Onset: 02/24/22 HPI: Patient is a 56 y.o. female with PMH: HTN, anxiety, depression, HLD, significant intracerebral hemorrhage with G-tube who presented from SNF with AMS. Patient reportedly had some nausea and vomiting day before admission and facility staff noted she appeared altered. EMS was called and patient was brought to hospital on 02/24/22. In ED patietn was afebrile, CXR showed mild pulmonary vascular congestion with stable scarring, CT head shoqwed no acute abnormality, neurology consulted recommending Keppra, EEG, MRI. MRI did not show any acute intracranial abnormalities. Type of Study: Bedside Swallow Evaluation Previous Swallow Assessment: during previous admission, MBS in July of 2022 Diet Prior to this Study: NPO Temperature Spikes Noted: No Respiratory Status: Room air History of Recent Intubation: No Behavior/Cognition: Alert;Cooperative;Requires cueing Oral Cavity Assessment: Within Functional Limits Oral Care Completed by SLP: Yes Oral Cavity - Dentition: Adequate natural dentition Self-Feeding Abilities: Needs set up;Needs assist Patient Positioning: Upright in bed Baseline Vocal Quality: Not observed Volitional Cough: Cognitively unable to elicit Volitional Swallow: Unable to elicit    Oral/Motor/Sensory Function Overall Oral Motor/Sensory Function: Other (comment) (moderately impaired s/p CVA in 2022 with extended hospitalization)   Ice Chips     Thin Liquid Thin Liquid:  Impaired Presentation: Straw Oral Phase Impairments: Reduced labial seal;Reduced lingual movement/coordination Oral Phase Functional Implications: Right anterior spillage;Prolonged oral transit Pharyngeal  Phase Impairments: Suspected delayed Swallow    Nectar Thick     Honey Thick     Puree Puree: Impaired Presentation: Spoon Oral Phase Impairments: Reduced lingual movement/coordination;Reduced labial seal Oral Phase Functional Implications: Prolonged oral transit;Right anterior spillage Pharyngeal Phase Impairments: Suspected delayed Swallow   Solid     Solid: Not tested     Angela Nevin, MA, CCC-SLP Speech Therapy

## 2022-02-26 NOTE — Progress Notes (Signed)
SLP Cancellation Note  Patient Details Name: Charlene [name] MRN: [id number] DOB: 02-Nov-1965   Cancelled treatment:       Reason Eval/Treat Not Completed: Patient's level of consciousness;Other (comment) (Patient not adequately arousable; SLP spoke with her husband who was in room and SLP will plan to return this date later in PM)   Angela Nevin, MA, CCC-SLP Speech Therapy

## 2022-02-26 NOTE — Progress Notes (Signed)
PROGRESS NOTE    Charlene [name]  OMB:[id number] DOB: April 26, 1966 DOA: 02/24/2022 PCP: Patient, [name]   Brief Narrative:  Charlene [name] is a 56 y.o. female with medical history significant of hypertension, anxiety, depression, hyperlipidemia, significant intracerebral hemorrhage with G-tube and urinary retention presenting with altered mental status.  Assessment & Plan:   Principal Problem:   Seizure ([place]) Active Problems:   Essential hypertension, benign   History of urinary retention-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   HLD (hyperlipidemia)   Acute metabolic encephalopathy  Acute metabolic encephalopathy(on chronic debility), unclear etiology  Rule out seizure At risk for polypharmacy/sundowning -At baseline largely nonverbal able to track with eyes and write minimally due to history of large/severe stroke -more appropriate today, but not yet back to baseline per husband at bedside -Presents with new gaze decreased responsiveness and lipsmacking concerning for seizure -Neurology following, initial EEG negative at this point -prophylactically placed on Keppra at intake -defer to neuro on continuation or medication changes -likely pending findings of continuous EEG -Continue to monitor for polypharmacy/sundowning although less likely at this point -No acute findings consistent with infection, reported single episode of fever outpatient without recurrence -Has been requesting speech evaluation given patient is reportedly able to feed herself on occasion at home, currently n.p.o. until cleared -feeding via chronic PEG tube otherwise  Hx of intracerebral hemorrhage -Chronic with notable significant left-sided functional deficits, nonverbal, can write occasionally and none to follow commands track movement at baseline  Urinary retention G-tube dependence - Continue doxazosin - Continue modafinil - Continue baclofen - Continue tube feeds and free water flushes  Hypertension - Continue home  amlodipine, carvedilol - Hold off on hydralazine for now  Hyperlipidemia - Continue atorvastatin   DVT prophylaxis: Lovenox Code Status: Full Family Communication: Husband at bedside   Status is: Inpatient  Dispo: The patient is from: Facility              Anticipated d/c is to: Same              Anticipated d/c date is: 48 to 72 hours              Patient currently not medically stable for discharge  Consultants:  Neurology  Procedures:  None  Antimicrobials:  None  Subjective: No acute issues or events overnight; review of systems markedly limited given above  Objective: Vitals:   02/25/22 2307 02/26/22 0255 02/26/22 0540 02/26/22 0700  BP: 107/70 110/81  132/83  Pulse: 97 92  96  Resp: (!) _0 Temp: 98.4 F (36.9 C) 97.9 F (36.6 C)    TempSrc: Axillary Axillary    SpO2: 99% 99%  100%  Weight:   48.2 kg   Height:        Intake/Output Summary (Last 24 hours) at 02/26/2022 0808 Last data filed at 02/26/2022 0416 Gross per 24 hour  Intake 1623.65 ml  Output --  Net 1623.65 ml   Filed Weights   02/25/22 0100 02/26/22 0540  Weight: 63.5 kg 48.2 kg    Examination:  General:  Pleasantly resting in bed, No acute distress.  Poorly interactive HEENT:  Normocephalic atraumatic.  Sclerae nonicteric, noninjected.  Extraocular movements intact bilaterally -tracking poorly. Neck:  Without mass or deformity.  Trachea is midline. Lungs:  Clear to auscultate bilaterally without rhonchi, wheeze, or rales. Heart:  Regular rate and rhythm.  Without murmurs, rubs, or gallops. Abdomen:  Soft, nontender, nondistended.  Without guarding or rebound.  PEG  tube clean dry intact Extremities: Without cyanosis or edema, left-sided deficits noted Skin:  Warm and dry, no erythema  Data Reviewed: I have personally reviewed following labs and imaging studies  CBC: Recent Labs  Lab 02/24/22 1954 02/24/22 2008 02/25/22 0040 02/25/22 0346  WBC 7.0  --   --  6.2   NEUTROABS 4.0  --   --   --   HGB 9.8* 9.9* 9.9* 9.5*  HCT 31.6* 29.0* 29.0* 30.3*  MCV 80.8  --   --  80.2  PLT 228  --   --  720    Basic Metabolic Panel: Recent Labs  Lab 02/24/22 1954 02/24/22 2008 02/25/22 0040 02/25/22 0346  NA 140 141 142 140  K 3.6 3.5 3.2* 3.3*  CL 107 104  --  106  CO2 26  --   --  26  GLUCOSE 96 89  --  87  BUN 20 21*  --  16  CREATININE 0.51 0.50  --  0.51  CALCIUM 9.0  --   --  9.1    GFR: Estimated Creatinine Clearance: 61.2 mL/min (by C-G formula based on SCr of 0.51 mg/dL). Liver Function Tests: Recent Labs  Lab 02/24/22 1954 02/25/22 0346  AST 16 17  ALT 17 17  ALKPHOS 66 70  BILITOT 0.3 0.5  PROT 6.3* 6.3*  ALBUMIN 3.1* 3.1*    No results for input(s): "LIPASE", "AMYLASE" in the last 168 hours. No results for input(s): "AMMONIA" in the last 168 hours. Coagulation Profile: Recent Labs  Lab 02/24/22 1954  INR 1.1    Cardiac Enzymes: No results for input(s): "CKTOTAL", "CKMB", "CKMBINDEX", "TROPONINI" in the last 168 hours. BNP (last 3 results) No results for input(s): "PROBNP" in the last 8760 hours. HbA1C: No results for input(s): "HGBA1C" in the last 72 hours. CBG: Recent Labs  Lab 02/25/22 1942 02/25/22 2326 02/26/22 0336  GLUCAP 71 101* 109*   Lipid Profile: No results for input(s): "CHOL", "HDL", "LDLCALC", "TRIG", "CHOLHDL", "LDLDIRECT" in the last 72 hours. Thyroid Function Tests: No results for input(s): "TSH", "T4TOTAL", "FREET4", "T3FREE", "THYROIDAB" in the last 72 hours. Anemia Panel: No results for input(s): "VITAMINB12", "FOLATE", "FERRITIN", "TIBC", "IRON", "RETICCTPCT" in the last 72 hours. Sepsis Labs: No results for input(s): "PROCALCITON", "LATICACIDVEN" in the last 168 hours.  Recent Results (from the past 240 hour(s))  Resp Panel by RT-PCR (Flu A&B, Covid) Anterior Nasal Swab     Status: None   Collection Time: 02/24/22  7:50 PM   Specimen: Anterior Nasal Swab  Result Value Ref Range  Status   SARS Coronavirus 2 by RT PCR NEGATIVE NEGATIVE Final    Comment: (NOTE) SARS-CoV-2 target nucleic acids are NOT DETECTED.  The SARS-CoV-2 RNA is generally detectable in upper respiratory specimens during the acute phase of infection. The lowest concentration of SARS-CoV-2 viral copies this assay can detect is 138 copies/mL. A negative result does not preclude SARS-Cov-2 infection and should not be used as the sole basis for treatment or other patient management decisions. A negative result may occur with  improper specimen collection/handling, submission of specimen other than nasopharyngeal swab, presence of viral mutation(s) within the areas targeted by this assay, and inadequate number of viral copies(<138 copies/mL). A negative result must be combined with clinical observations, patient history, and epidemiological information. The expected result is Negative.  Fact Sheet for Patients:  EntrepreneurPulse.com.au  Fact Sheet for Healthcare Providers:  IncredibleEmployment.be  This test is no t yet approved or cleared by the  [place] Architectural technologist and  has been authorized for detection and/or diagnosis of SARS-CoV-2 by FDA under an Print production planner (EUA). This EUA will remain  in effect (meaning this test can be used) for the duration of the COVID-19 declaration under Section 564(b)(1) of the Act, 21 U.S.C.section 360bbb-3(b)(1), unless the authorization is terminated  or revoked sooner.       Influenza A by PCR NEGATIVE NEGATIVE Final   Influenza B by PCR NEGATIVE NEGATIVE Final    Comment: (NOTE) The Xpert Xpress SARS-CoV-2/FLU/RSV plus assay is intended as an aid in the diagnosis of influenza from Nasopharyngeal swab specimens and should not be used as a sole basis for treatment. Nasal washings and aspirates are unacceptable for Xpert Xpress SARS-CoV-2/FLU/RSV testing.  Fact Sheet for  Patients: EntrepreneurPulse.com.au  Fact Sheet for Healthcare Providers: IncredibleEmployment.be  This test is not yet approved or cleared by the [place] FDA and has been authorized for detection and/or diagnosis of SARS-CoV-2 by FDA under an Emergency Use Authorization (EUA). This EUA will remain in effect (meaning this test can be used) for the duration of the COVID-19 declaration under Section 564(b)(1) of the Act, 21 U.S.C. section 360bbb-3(b)(1), unless the authorization is terminated or revoked.  Performed at [facility], [place] [address], [place], [place] [address]   Respiratory (~20 pathogens) panel by PCR     Status: None   Collection Time: 02/25/22  4:42 AM   Specimen: Nasopharyngeal Swab; Respiratory  Result Value Ref Range Status   Adenovirus NOT DETECTED NOT DETECTED Final   Coronavirus 229E NOT DETECTED NOT DETECTED Final    Comment: (NOTE) The Coronavirus on the Respiratory Panel, DOES NOT test for the novel  Coronavirus (2019 nCoV)    Coronavirus HKU1 NOT DETECTED NOT DETECTED Final   Coronavirus NL63 NOT DETECTED NOT DETECTED Final   Coronavirus OC43 NOT DETECTED NOT DETECTED Final   Metapneumovirus NOT DETECTED NOT DETECTED Final   Rhinovirus / Enterovirus NOT DETECTED NOT DETECTED Final   Influenza A NOT DETECTED NOT DETECTED Final   Influenza B NOT DETECTED NOT DETECTED Final   Parainfluenza Virus 1 NOT DETECTED NOT DETECTED Final   Parainfluenza Virus 2 NOT DETECTED NOT DETECTED Final   Parainfluenza Virus 3 NOT DETECTED NOT DETECTED Final   Parainfluenza Virus 4 NOT DETECTED NOT DETECTED Final   Respiratory Syncytial Virus NOT DETECTED NOT DETECTED Final   Bordetella pertussis NOT DETECTED NOT DETECTED Final   Bordetella Parapertussis NOT DETECTED NOT DETECTED Final   Chlamydophila pneumoniae NOT DETECTED NOT DETECTED Final   Mycoplasma pneumoniae NOT DETECTED NOT DETECTED Final    Comment: Performed at  [facility], [place]. [address], [place], [place] [address]         Radiology Studies: MR BRAIN W WO CONTRAST  Result Date: 02/25/2022 CLINICAL DATA:  Seizure, new onset, no history of trauma. Altered mental status. EXAM: MRI HEAD WITHOUT AND WITH CONTRAST TECHNIQUE: Multiplanar, multiecho pulse sequences of the brain and surrounding structures were obtained without and with intravenous contrast. CONTRAST:  65mL GADAVIST GADOBUTROL 1 MMOL/ML IV SOLN COMPARISON:  CT head without contrast 02/24/2022. MR head without contrast 09/05/2020 FINDINGS: Brain: No acute infarct, hemorrhage, or mass lesion is present. Expected evolution of remote hemorrhagic infarct involving the right thalamus and posterior lentiform nucleus noted. Ventriculostomy tract present. No acute hemorrhage is present. Remote lacunar infarcts of the left thalamus are new since the prior exam. Encephalomalacia extends into the right-sided brainstem with asymmetric T2 hyperintensities  in the mid brain and pons. Focal encephalomalacia along the undersurface of the right temporal lobe could be a seizure focus. The right temporal tip is dilated, ex vacuo dilation. Ventricles are otherwise normal size. No significant extra-axial fluid collection is present. The internal auditory canals are within normal limits. Postcontrast images demonstrate no pathologic enhancement. Vascular: Flow is present in the major intracranial arteries. Skull and upper cervical spine: The craniocervical junction is normal. Upper cervical spine is within normal limits. Marrow signal is unremarkable. Reversal of the normal cervical lordosis is noted. Sinuses/Orbits: Left mastoid effusion is present. No obstructing nasopharyngeal lesion is present. The paranasal sinuses and mastoid air cells are otherwise clear. The globes and orbits are within normal limits. IMPRESSION: 1. No acute intracranial abnormality or significant interval change. 2. Expected evolution of remote  hemorrhagic infarct of the right thalamus and posterior lentiform nucleus. 3. Remote lacunar infarcts of the left thalamus are new since the prior exam. 4. Focal encephalomalacia along the undersurface of the right temporal lobe could be a seizure focus. 5. Left mastoid effusion. No obstructing nasopharyngeal lesion is present. Electronically Signed   By: San Morelle M.D.   On: 02/25/2022 17:02   Overnight EEG with video  Result Date: 02/25/2022 Lora Havens, MD     02/26/2022  6:57 AM Patient Name: Charlene [name] MRN: [id number] Epilepsy Attending: Lora Havens Referring Physician/Provider: Amie Portland, MD Duration: 02/24/2022 2333 to 02/25/2022 1340  Patient history: 56 year old with prior right thalamic ICH, IVH with ensuing left-sided spastic hemiparesis, resident of a SNF with a poor modified Rankin score at baseline, presenting with what appears to be new onset seizure. EEG to evaluate for seizure  Level of alertness: Awake, asleep  AEDs during EEG study: LEV  Technical aspects: This EEG study was done with scalp electrodes positioned according to the 10-20 International system of electrode placement. Electrical activity was reviewed with band pass filter of 1-_0 , sensitivity of 7 uV/mm, display speed of 45m/sec with a _1  notched filter applied as appropriate. EEG data were recorded continuously and digitally stored.  Video monitoring was available and reviewed as appropriate.  Description: The posterior dominant rhythm consists of 9 Hz activity of moderate voltage (25-35 uV) seen predominantly in posterior head regions, symmetric and reactive to eye opening and eye closing.  Sleep was characterized by vertex waves, sleep spindles (12 to 14 Hz), maximal frontocentral region.  EEG showed intermittent generalized and lateralized right hemisphere 3 to 6 Hz theta-delta slowing.  Hyperventilation and photic stimulation were not performed.   ABNORMALITY -Intermittent slow, generalized and  lateralized right hemisphere  IMPRESSION: This study is suggestive of cortical dysfunction in right hemisphere likely secondary to underlying stroke as well as mild diffuse encephalopathy, nonspecific etiology. No seizures or epileptiform discharges were seen throughout the recording.  PLora Havens  EEG adult  Result Date: 02/24/2022 YLora Havens MD     02/25/2022  8:29 AM Patient Name: Charlene FEHRENBACHMRN: [id number] Attending: PLora HavensReferring Physician/Provider: AAmie Portland MD Date: 02/24/2022 Duration: 29.04 mins Patient history: 56year old with prior right thalamic ICH, IVH with ensuing left-sided spastic hemiparesis, resident of a SNF with a poor modified Rankin score at baseline, presenting with what appears to be new onset seizure. EEG to evaluate for seizure Level of alertness: Awake, asleep AEDs during EEG study: LEV Technical aspects: This EEG study was done with scalp electrodes positioned according to the 10-20 International system of electrode placement. Electrical activity  was reviewed with band pass filter of 1-_0 , sensitivity of 7 uV/mm, display speed of 39m/sec with a _1  notched filter applied as appropriate. EEG data were recorded continuously and digitally stored.  Video monitoring was available and reviewed as appropriate. Description: The posterior dominant rhythm consists of 9 Hz activity of moderate voltage (25-35 uV) seen predominantly in posterior head regions, symmetric and reactive to eye opening and eye closing.  Sleep was characterized by vertex waves, sleep spindles (12 to 14 Hz), maximal frontocentral region.  Hyperventilation and photic stimulation were not performed.   IMPRESSION: This study is within normal limits. No seizures or epileptiform discharges were seen throughout the recording. A normal interictal EEG does not exclude nor support the diagnosis of epilepsy. PLora Havens  CT HEAD WO CONTRAST  Result Date:  02/24/2022 CLINICAL DATA:  Mental status change, unknown cause EXAM: CT HEAD WITHOUT CONTRAST TECHNIQUE: Contiguous axial images were obtained from the base of the skull through the vertex without intravenous contrast. RADIATION DOSE REDUCTION: This exam was performed according to the departmental dose-optimization program which includes automated exposure control, adjustment of the mA and/or kV according to patient size and/or use of iterative reconstruction technique. COMPARISON:  04/08/2021 FINDINGS: Brain: Old right thalamic and basal ganglia lacunar infarcts. Encephalomalacia/gliosis involving the right corona radiata and inferior right temporal lobe. No acute intracranial abnormality. Specifically, no hemorrhage, hydrocephalus, mass lesion, acute infarction, or significant intracranial injury. Vascular: No hyperdense vessel or unexpected calcification. Skull: No acute calvarial abnormality. Sinuses/Orbits: No acute findings Other: None IMPRESSION: Chronic changes as above, stable since prior study. No acute intracranial abnormality. Electronically Signed   By: KRolm BaptiseM.D.   On: 02/24/2022 21:25   DG Chest Port 1 View  Result Date: 02/24/2022 CLINICAL DATA:  Altered mental status.  Code STEMI EXAM: PORTABLE CHEST 1 VIEW COMPARISON:  One-view chest x-ray 03/25/2021 FINDINGS: Tracheostomy tube was removed. Heart size is normal. Mild pulmonary vascular congestion is present without frank edema. No focal airspace consolidation is present. Asymmetric scarring is again noted at the left apex. The visualized soft tissues and bony thorax are unremarkable. IMPRESSION: 1. Mild pulmonary vascular congestion without frank edema. 2. Stable asymmetric scarring at the left apex. Electronically Signed   By: CSan MorelleM.D.   On: 02/24/2022 20:08    Scheduled Meds:  amLODipine  10 mg Per Tube Daily   atorvastatin  40 mg Per Tube Daily   baclofen  5 mg Per Tube QHS   chlorhexidine gluconate (MEDLINE  KIT)  15 mL Mouth Rinse BID   doxazosin  2 mg Per Tube Daily   enoxaparin (LOVENOX) injection  40 mg Subcutaneous Q24H   free water  200 mL Per Tube Q4H   multivitamin with minerals  1 tablet Per Tube Daily   mouth rinse  15 mL Mouth Rinse 10 times per day   pantoprazole  40 mg Per Tube Daily   sodium chloride flush  3 mL Intravenous Q12H   Continuous Infusions:  feeding supplement (OSMOLITE 1.5 CAL) 40 mL/hr at 02/26/22 0130   levETIRAcetam 500 mg (02/25/22 2143)    LOS: 1 day   Time spent: 489m  Niesha Bame C Yazmin Locher, DO [facility]  If 7PM-7AM, please contact night-coverage [email or web address]  02/26/2022, 8:08 AM

## 2022-02-27 DIAGNOSIS — R569 Unspecified convulsions: Secondary | ICD-10-CM | POA: Diagnosis not present

## 2022-02-27 LAB — GLUCOSE, CAPILLARY
Glucose-Capillary: 123 mg/dL — ABNORMAL HIGH (ref 70–99)
Glucose-Capillary: 116 mg/dL — ABNORMAL HIGH (ref 70–99)
Glucose-Capillary: 99 mg/dL (ref 70–99)
Glucose-Capillary: 92 mg/dL (ref 70–99)

## 2022-02-27 MED ORDER — BACLOFEN 1 MG/ML ORAL SUSPENSION
5.0000 mg | Freq: Every day | ORAL | Status: DC
Start: 2022-02-27 — End: 2024-04-08

## 2022-02-27 MED ORDER — LEVETIRACETAM 100 MG/ML PO SOLN: 500.0000 mg | Freq: Two times a day (BID) | ORAL | 2 refills | Status: DC

## 2022-02-27 NOTE — Progress Notes (Signed)
Report called to [place] at [facility]

## 2022-02-27 NOTE — Discharge Summary (Signed)
Physician Discharge Summary  Charlene [name] MPN:[id number] DOB: 1966-05-07 DOA: 02/24/2022  PCP: Charlene [name]  Admit date: 02/24/2022 Discharge date: 02/27/2022  Admitted From: SNF Disposition: Same  Recommendations for Outpatient Follow-up:  Follow up with PCP in 1-2 weeks Follow-up with neurology as scheduled  Discharge Condition: Stable CODE STATUS: Full Diet Recommendation: Thin liquid;Dysphagia 1 (Puree)    Liquid Administration via: Straw Medication Administration: Via alternative means Supervision: Full supervision/cueing for compensatory strategies;Staff to assist with self feeding Compensations: Slow rate;Small sips/bites;Minimize environmental distractions Postural Changes: Seated upright at 90 degrees     Brief/Interim Summary: Charlene [name] is a 56 y.o. female with medical history significant of hypertension, anxiety, depression, hyperlipidemia, significant intracerebral hemorrhage with G-tube and urinary retention presenting with altered mental status.  Patient admitted as above with acute metabolic encephalopathy on chronic debility initially thought to be secondary to seizure given her mental status changes.  Neurology consulted initiated on Keppra with markedly improving symptoms now back to baseline.  Patient much more interactive and appropriate today appears to be back to baseline per mother at bedside, patient passed speech evaluation and has been placed back on her dysphagia 1 diet consistent with her previous history.  Her chronic comorbid conditions as below without acute incident or issue.  Otherwise stable and agreeable for discharge back to facility for ongoing care and management.  Discharge Diagnoses:  Principal Problem:   Seizure [facility]) Active Problems:   Essential hypertension, benign   History of urinary retention-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   HLD (hyperlipidemia)   Acute metabolic encephalopathy  Discharge Instructions   Allergies as of 02/27/2022   No  Known Allergies      Medication List     TAKE these medications    acetaminophen 160 MG/5ML liquid Commonly known as: TYLENOL Place 160 mg into feeding tube in the morning.   acetaminophen 650 MG suppository Commonly known as: TYLENOL Place 650 mg rectally every 6 (six) hours as needed (temperature 100 or above).   amLODipine 10 MG tablet Commonly known as: NORVASC Place 1 tablet (10 mg total) into feeding tube daily. What changed: when to take this   aspirin 81 MG chewable tablet Place 81 mg into feeding tube in the morning.   atorvastatin 40 MG tablet Commonly known as: LIPITOR Place 1 tablet (40 mg total) into feeding tube daily. What changed: when to take this   baclofen 1 mg/mL Soln oral solution Commonly known as: OZOBAX Place 5 mLs (5 mg total) into feeding tube at bedtime. What changed: Another medication with the same name was removed. Continue taking this medication, and follow the directions you see here.   cefTRIAXone 1 g injection Commonly known as: ROCEPHIN Inject 1 g into the muscle See admin instructions. Ceftriaxone 1g mixed with lidocaine - 1 dose given 02/24/22   Chlorhexidine Gluconate 20 % Soln Take 1 application  by mouth See admin instructions. Apply 1 application buccally at bedtime for oral care for 3 weeks (starting 02/17/22) - apply with oral care stick to tongue/mucosa   diclofenac Sodium 1 % Gel Commonly known as: VOLTAREN Apply 4 g topically 4 (four) times daily. What changed:  when to take this additional instructions   doxazosin 2 MG tablet Commonly known as: CARDURA Place 1 tablet (2 mg total) into feeding tube daily. What changed: when to take this   ferrous sulfate 325 (65 FE) MG tablet Take 325 mg by mouth in the morning. Via tube   fluticasone 50 MCG/ACT nasal spray Commonly  known as: FLONASE Place 2 sprays into both nostrils daily. What changed: when to take this   free water Soln Place 200 mLs into feeding tube every 4  (four) hours.   Isosource 1.5 Cal Liqd Give 360 mLs by tube See admin instructions. 40 cc / hr from (ON - 22:00, OFF - 06:00)   levETIRAcetam 100 MG/ML solution Commonly known as: Keppra Take 5 mLs (500 mg total) by mouth 2 (two) times daily.   lidocaine 4 % Place 1 patch onto the skin in the morning. To right knee   NexIUM 20 MG packet Generic drug: esomeprazole Take 20 mg by mouth in the morning. Via tube   nystatin 100000 UNIT/ML suspension Commonly known as: MYCOSTATIN Use as directed 5 mLs in the mouth or throat See admin instructions. Apply 5 ml buccally two times daily for 3 weeks (starting 02/17/22) - apply thin layer with oral care stick to tongue   polyethylene glycol 17 g packet Commonly known as: MIRALAX / GLYCOLAX Place 17 g into feeding tube daily as needed for moderate constipation.   traMADol 50 MG tablet Commonly known as: ULTRAM Place 0.5 tablets (25 mg total) into feeding tube every 12 (twelve) hours as needed for moderate pain. What changed: reasons to take this   Vitamin D-3 5000 UNIT/ML Liqd Give 5,000 mLs by tube every Monday.        Contact information for after-discharge care     Destination     [facility] .   Service: Skilled Nursing Contact information: [address] [place] [address] [phone]                    No Known Allergies  Consultations: Neurology   Procedures/Studies: MR BRAIN W WO CONTRAST  Result Date: 02/25/2022 CLINICAL DATA:  Seizure, new onset, no history of trauma. Altered mental status. EXAM: MRI HEAD WITHOUT AND WITH CONTRAST TECHNIQUE: Multiplanar, multiecho pulse sequences of the brain and surrounding structures were obtained without and with intravenous contrast. CONTRAST:  70mL GADAVIST GADOBUTROL 1 MMOL/ML IV SOLN COMPARISON:  CT head without contrast 02/24/2022. MR head without contrast 09/05/2020 FINDINGS: Brain: No acute infarct, hemorrhage, or mass lesion is present.  Expected evolution of remote hemorrhagic infarct involving the right thalamus and posterior lentiform nucleus noted. Ventriculostomy tract present. No acute hemorrhage is present. Remote lacunar infarcts of the left thalamus are new since the prior exam. Encephalomalacia extends into the right-sided brainstem with asymmetric T2 hyperintensities in the mid brain and pons. Focal encephalomalacia along the undersurface of the right temporal lobe could be a seizure focus. The right temporal tip is dilated, ex vacuo dilation. Ventricles are otherwise normal size. No significant extra-axial fluid collection is present. The internal auditory canals are within normal limits. Postcontrast images demonstrate no pathologic enhancement. Vascular: Flow is present in the major intracranial arteries. Skull and upper cervical spine: The craniocervical junction is normal. Upper cervical spine is within normal limits. Marrow signal is unremarkable. Reversal of the normal cervical lordosis is noted. Sinuses/Orbits: Left mastoid effusion is present. No obstructing nasopharyngeal lesion is present. The paranasal sinuses and mastoid air cells are otherwise clear. The globes and orbits are within normal limits. IMPRESSION: 1. No acute intracranial abnormality or significant interval change. 2. Expected evolution of remote hemorrhagic infarct of the right thalamus and posterior lentiform nucleus. 3. Remote lacunar infarcts of the left thalamus are new since the prior exam. 4. Focal encephalomalacia along the undersurface of the right temporal lobe  could be a seizure focus. 5. Left mastoid effusion. No obstructing nasopharyngeal lesion is present. Electronically Signed   By: Marin Robertshristopher  Mattern M.D.   On: 02/25/2022 17:02   Overnight EEG with video  Result Date: 02/25/2022 Charlsie QuestYadav, Priyanka O, MD     02/26/2022  6:57 AM Patient Name: Charlene [name] MRN: [id number] Epilepsy Attending: Charlsie QuestPriyanka O Yadav Referring Physician/Provider: Milon DikesArora,  Ashish, MD Duration: 02/24/2022 2333 to 02/25/2022 1340  Patient history: 56 year old with prior right thalamic ICH, IVH with ensuing left-sided spastic hemiparesis, resident of a SNF with a poor modified Rankin score at baseline, presenting with what appears to be new onset seizure. EEG to evaluate for seizure  Level of alertness: Awake, asleep  AEDs during EEG study: LEV  Technical aspects: This EEG study was done with scalp electrodes positioned according to the 10-20 International system of electrode placement. Electrical activity was reviewed with band pass filter of 1-70Hz , sensitivity of 7 uV/mm, display speed of 230mm/sec with a 60Hz  notched filter applied as appropriate. EEG data were recorded continuously and digitally stored.  Video monitoring was available and reviewed as appropriate.  Description: The posterior dominant rhythm consists of 9 Hz activity of moderate voltage (25-35 uV) seen predominantly in posterior head regions, symmetric and reactive to eye opening and eye closing.  Sleep was characterized by vertex waves, sleep spindles (12 to 14 Hz), maximal frontocentral region.  EEG showed intermittent generalized and lateralized right hemisphere 3 to 6 Hz theta-delta slowing.  Hyperventilation and photic stimulation were not performed.   ABNORMALITY -Intermittent slow, generalized and lateralized right hemisphere  IMPRESSION: This study is suggestive of cortical dysfunction in right hemisphere likely secondary to underlying stroke as well as mild diffuse encephalopathy, nonspecific etiology. No seizures or epileptiform discharges were seen throughout the recording.  Charlsie QuestPriyanka O Yadav   EEG adult  Result Date: 02/24/2022 Charlsie QuestYadav, Priyanka O, MD     02/25/2022  8:29 AM Patient Name: Charlene [name] MRN: [id number] Epilepsy Attending: Charlsie QuestPriyanka O Yadav Referring Physician/Provider: Milon DikesArora, Ashish, MD Date: 02/24/2022 Duration: 29.04 mins Patient history: 56 year old with prior right thalamic ICH, IVH with  ensuing left-sided spastic hemiparesis, resident of a SNF with a poor modified Rankin score at baseline, presenting with what appears to be new onset seizure. EEG to evaluate for seizure Level of alertness: Awake, asleep AEDs during EEG study: LEV Technical aspects: This EEG study was done with scalp electrodes positioned according to the 10-20 International system of electrode placement. Electrical activity was reviewed with band pass filter of 1-70Hz , sensitivity of 7 uV/mm, display speed of 530mm/sec with a 60Hz  notched filter applied as appropriate. EEG data were recorded continuously and digitally stored.  Video monitoring was available and reviewed as appropriate. Description: The posterior dominant rhythm consists of 9 Hz activity of moderate voltage (25-35 uV) seen predominantly in posterior head regions, symmetric and reactive to eye opening and eye closing.  Sleep was characterized by vertex waves, sleep spindles (12 to 14 Hz), maximal frontocentral region.  Hyperventilation and photic stimulation were not performed.   IMPRESSION: This study is within normal limits. No seizures or epileptiform discharges were seen throughout the recording. A normal interictal EEG does not exclude nor support the diagnosis of epilepsy. Charlsie QuestPriyanka O Yadav   CT HEAD WO CONTRAST  Result Date: 02/24/2022 CLINICAL DATA:  Mental status change, unknown cause EXAM: CT HEAD WITHOUT CONTRAST TECHNIQUE: Contiguous axial images were obtained from the base of the skull through the vertex without intravenous contrast. RADIATION DOSE REDUCTION:  This exam was performed according to the departmental dose-optimization program which includes automated exposure control, adjustment of the mA and/or kV according to patient size and/or use of iterative reconstruction technique. COMPARISON:  04/08/2021 FINDINGS: Brain: Old right thalamic and basal ganglia lacunar infarcts. Encephalomalacia/gliosis involving the right corona radiata and inferior  right temporal lobe. No acute intracranial abnormality. Specifically, no hemorrhage, hydrocephalus, mass lesion, acute infarction, or significant intracranial injury. Vascular: No hyperdense vessel or unexpected calcification. Skull: No acute calvarial abnormality. Sinuses/Orbits: No acute findings Other: None IMPRESSION: Chronic changes as above, stable since prior study. No acute intracranial abnormality. Electronically Signed   By: Charlett Nose M.D.   On: 02/24/2022 21:25   DG Chest Port 1 View  Result Date: 02/24/2022 CLINICAL DATA:  Altered mental status.  Code STEMI EXAM: PORTABLE CHEST 1 VIEW COMPARISON:  One-view chest x-ray 03/25/2021 FINDINGS: Tracheostomy tube was removed. Heart size is normal. Mild pulmonary vascular congestion is present without frank edema. No focal airspace consolidation is present. Asymmetric scarring is again noted at the left apex. The visualized soft tissues and bony thorax are unremarkable. IMPRESSION: 1. Mild pulmonary vascular congestion without frank edema. 2. Stable asymmetric scarring at the left apex. Electronically Signed   By: Marin Roberts M.D.   On: 02/24/2022 20:08     Subjective: No acute issues or events overnight back to baseline per mother at bedside   Discharge Exam: Vitals:   02/27/22 1003 02/27/22 1219  BP: 119/70 131/85  Pulse:  89  Resp:  18  Temp:  99 F (37.2 C)  SpO2:  98%   Vitals:   02/27/22 0515 02/27/22 0723 02/27/22 1003 02/27/22 1219  BP:  113/74 119/70 131/85  Pulse:  65  89  Resp:  17  18  Temp:  98.4 F (36.9 C)  99 F (37.2 C)  TempSrc:  Oral  Oral  SpO2:  100%  98%  Weight: 48.3 kg     Height:       General:  Pleasantly resting in bed, No acute distress.  Minimally interactive at her baseline HEENT:  Normocephalic. Sclerae nonicteric, noninjected.  Extraocular movements intact bilaterally. Neck:  Without mass or deformity.  Trachea is midline. Lungs:  Clear to auscultate bilaterally without rhonchi,  wheeze, or rales. Heart:  Regular rate and rhythm.  Without murmurs, rubs, or gallops. Abdomen:  Soft, nontender, nondistended.  Without guarding or rebound. Extremities: Without cyanosis, clubbing, edema   The results of significant diagnostics from this hospitalization (including imaging, microbiology, ancillary and laboratory) are listed below for reference.     Microbiology: Recent Results (from the past 240 hour(s))  Resp Panel by RT-PCR (Flu A&B, Covid) Anterior Nasal Swab     Status: None   Collection Time: 02/24/22  7:50 PM   Specimen: Anterior Nasal Swab  Result Value Ref Range Status   SARS Coronavirus 2 by RT PCR NEGATIVE NEGATIVE Final    Comment: (NOTE) SARS-CoV-2 target nucleic acids are NOT DETECTED.  The SARS-CoV-2 RNA is generally detectable in upper respiratory specimens during the acute phase of infection. The lowest concentration of SARS-CoV-2 viral copies this assay can detect is 138 copies/mL. A negative result does not preclude SARS-Cov-2 infection and should not be used as the sole basis for treatment or other patient management decisions. A negative result may occur with  improper specimen collection/handling, submission of specimen other than nasopharyngeal swab, presence of viral mutation(s) within the areas targeted by this assay, and inadequate number of viral copies(<138  copies/mL). A negative result must be combined with clinical observations, patient history, and epidemiological information. The expected result is Negative.  Fact Sheet for Patients:  BloggerCourse.com  Fact Sheet for Healthcare Providers:  SeriousBroker.it  This test is no t yet approved or cleared by the [place] FDA and  has been authorized for detection and/or diagnosis of SARS-CoV-2 by FDA under an Emergency Use Authorization (EUA). This EUA will remain  in effect (meaning this test can be used) for the duration of  the COVID-19 declaration under Section 564(b)(1) of the Act, 21 U.S.C.section 360bbb-3(b)(1), unless the authorization is terminated  or revoked sooner.       Influenza A by PCR NEGATIVE NEGATIVE Final   Influenza B by PCR NEGATIVE NEGATIVE Final    Comment: (NOTE) The Xpert Xpress SARS-CoV-2/FLU/RSV plus assay is intended as an aid in the diagnosis of influenza from Nasopharyngeal swab specimens and should not be used as a sole basis for treatment. Nasal washings and aspirates are unacceptable for Xpert Xpress SARS-CoV-2/FLU/RSV testing.  Fact Sheet for Patients: BloggerCourse.com  Fact Sheet for Healthcare Providers: SeriousBroker.it  This test is not yet approved or cleared by the [place] FDA and has been authorized for detection and/or diagnosis of SARS-CoV-2 by FDA under an Emergency Use Authorization (EUA). This EUA will remain in effect (meaning this test can be used) for the duration of the COVID-19 declaration under Section 564(b)(1) of the Act, 21 U.S.C. section 360bbb-3(b)(1), unless the authorization is terminated or revoked.  Performed at [facility], [address] [place], [place] [address]   Respiratory (~20 pathogens) panel by PCR     Status: None   Collection Time: 02/25/22  4:42 AM   Specimen: Nasopharyngeal Swab; Respiratory  Result Value Ref Range Status   Adenovirus NOT DETECTED NOT DETECTED Final   Coronavirus 229E NOT DETECTED NOT DETECTED Final    Comment: (NOTE) The Coronavirus on the Respiratory Panel, DOES NOT test for the novel  Coronavirus (2019 nCoV)    Coronavirus HKU1 NOT DETECTED NOT DETECTED Final   Coronavirus NL63 NOT DETECTED NOT DETECTED Final   Coronavirus OC43 NOT DETECTED NOT DETECTED Final   Metapneumovirus NOT DETECTED NOT DETECTED Final   Rhinovirus / Enterovirus NOT DETECTED NOT DETECTED Final   Influenza A NOT DETECTED NOT DETECTED Final   Influenza B NOT  DETECTED NOT DETECTED Final   Parainfluenza Virus 1 NOT DETECTED NOT DETECTED Final   Parainfluenza Virus 2 NOT DETECTED NOT DETECTED Final   Parainfluenza Virus 3 NOT DETECTED NOT DETECTED Final   Parainfluenza Virus 4 NOT DETECTED NOT DETECTED Final   Respiratory Syncytial Virus NOT DETECTED NOT DETECTED Final   Bordetella pertussis NOT DETECTED NOT DETECTED Final   Bordetella Parapertussis NOT DETECTED NOT DETECTED Final   Chlamydophila pneumoniae NOT DETECTED NOT DETECTED Final   Mycoplasma pneumoniae NOT DETECTED NOT DETECTED Final    Comment: Performed at [facility], [address] [place], [place] [address]     Labs: BNP (last 3 results) No results for input(s): "BNP" in the last 8760 hours. Basic Metabolic Panel: Recent Labs  Lab 02/24/22 1954 02/24/22 2008 02/25/22 0040 02/25/22 0346  NA 140 141 142 140  K 3.6 3.5 3.2* 3.3*  CL 107 104  --  106  CO2 26  --   --  26  GLUCOSE 96 89  --  87  BUN 20 21*  --  16  CREATININE 0.51 0.50  --  0.51  CALCIUM 9.0  --   --  9.1   Liver Function Tests: Recent Labs  Lab 02/24/22 1954 02/25/22 0346  AST 16 17  ALT 17 17  ALKPHOS 66 70  BILITOT 0.3 0.5  PROT 6.3* 6.3*  ALBUMIN 3.1* 3.1*   No results for input(s): "LIPASE", "AMYLASE" in the last 168 hours. No results for input(s): "AMMONIA" in the last 168 hours. CBC: Recent Labs  Lab 02/24/22 1954 02/24/22 2008 02/25/22 0040 02/25/22 0346  WBC 7.0  --   --  6.2  NEUTROABS 4.0  --   --   --   HGB 9.8* 9.9* 9.9* 9.5*  HCT 31.6* 29.0* 29.0* 30.3*  MCV 80.8  --   --  80.2  PLT 228  --   --  222   Cardiac Enzymes: No results for input(s): "CKTOTAL", "CKMB", "CKMBINDEX", "TROPONINI" in the last 168 hours. BNP: Invalid input(s): "POCBNP" CBG: Recent Labs  Lab 02/26/22 2021 02/26/22 2337 02/27/22 0322 02/27/22 0810 02/27/22 1218  GLUCAP 108* 121* 123* 116* 99   D-Dimer No results for input(s): "DDIMER" in the last 72 hours. Hgb A1c No results for  input(s): "HGBA1C" in the last 72 hours. Lipid Profile No results for input(s): "CHOL", "HDL", "LDLCALC", "TRIG", "CHOLHDL", "LDLDIRECT" in the last 72 hours. Thyroid function studies No results for input(s): "TSH", "T4TOTAL", "T3FREE", "THYROIDAB" in the last 72 hours.  Invalid input(s): "FREET3" Anemia work up No results for input(s): "VITAMINB12", "FOLATE", "FERRITIN", "TIBC", "IRON", "RETICCTPCT" in the last 72 hours. Urinalysis    Component Value Date/Time   COLORURINE STRAW (A) 02/25/2022 0730   APPEARANCEUR CLEAR 02/25/2022 0730   LABSPEC 1.006 02/25/2022 0730   PHURINE 6.0 02/25/2022 0730   GLUCOSEU NEGATIVE 02/25/2022 0730   HGBUR NEGATIVE 02/25/2022 0730   BILIRUBINUR NEGATIVE 02/25/2022 0730   KETONESUR NEGATIVE 02/25/2022 0730   PROTEINUR NEGATIVE 02/25/2022 0730   UROBILINOGEN 0.2 02/21/2014 1629   NITRITE NEGATIVE 02/25/2022 0730   LEUKOCYTESUR TRACE (A) 02/25/2022 0730   Sepsis Labs Recent Labs  Lab 02/24/22 1954 02/25/22 0346  WBC 7.0 6.2   Microbiology Recent Results (from the past 240 hour(s))  Resp Panel by RT-PCR (Flu A&B, Covid) Anterior Nasal Swab     Status: None   Collection Time: 02/24/22  7:50 PM   Specimen: Anterior Nasal Swab  Result Value Ref Range Status   SARS Coronavirus 2 by RT PCR NEGATIVE NEGATIVE Final    Comment: (NOTE) SARS-CoV-2 target nucleic acids are NOT DETECTED.  The SARS-CoV-2 RNA is generally detectable in upper respiratory specimens during the acute phase of infection. The lowest concentration of SARS-CoV-2 viral copies this assay can detect is 138 copies/mL. A negative result does not preclude SARS-Cov-2 infection and should not be used as the sole basis for treatment or other patient management decisions. A negative result may occur with  improper specimen collection/handling, submission of specimen other than nasopharyngeal swab, presence of viral mutation(s) within the areas targeted by this assay, and inadequate  number of viral copies(<138 copies/mL). A negative result must be combined with clinical observations, patient history, and epidemiological information. The expected result is Negative.  Fact Sheet for Patients:  BloggerCourse.com  Fact Sheet for Healthcare Providers:  SeriousBroker.it  This test is no t yet approved or cleared by the [place] FDA and  has been authorized for detection and/or diagnosis of SARS-CoV-2 by FDA under an Emergency Use Authorization (EUA). This EUA will remain  in effect (meaning this test can be used) for  the duration of the COVID-19 declaration under Section 564(b)(1) of the Act, 21 U.S.C.section 360bbb-3(b)(1), unless the authorization is terminated  or revoked sooner.       Influenza A by PCR NEGATIVE NEGATIVE Final   Influenza B by PCR NEGATIVE NEGATIVE Final    Comment: (NOTE) The Xpert Xpress SARS-CoV-2/FLU/RSV plus assay is intended as an aid in the diagnosis of influenza from Nasopharyngeal swab specimens and should not be used as a sole basis for treatment. Nasal washings and aspirates are unacceptable for Xpert Xpress SARS-CoV-2/FLU/RSV testing.  Fact Sheet for Patients: BloggerCourse.com  Fact Sheet for Healthcare Providers: SeriousBroker.it  This test is not yet approved or cleared by the [place] FDA and has been authorized for detection and/or diagnosis of SARS-CoV-2 by FDA under an Emergency Use Authorization (EUA). This EUA will remain in effect (meaning this test can be used) for the duration of the COVID-19 declaration under Section 564(b)(1) of the Act, 21 U.S.C. section 360bbb-3(b)(1), unless the authorization is terminated or revoked.  Performed at [facility], [address], [place], [place] [address]   Respiratory (~20 pathogens) panel by PCR     Status: None   Collection Time: 02/25/22  4:42 AM    Specimen: Nasopharyngeal Swab; Respiratory  Result Value Ref Range Status   Adenovirus NOT DETECTED NOT DETECTED Final   Coronavirus 229E NOT DETECTED NOT DETECTED Final    Comment: (NOTE) The Coronavirus on the Respiratory Panel, DOES NOT test for the novel  Coronavirus (2019 nCoV)    Coronavirus HKU1 NOT DETECTED NOT DETECTED Final   Coronavirus NL63 NOT DETECTED NOT DETECTED Final   Coronavirus OC43 NOT DETECTED NOT DETECTED Final   Metapneumovirus NOT DETECTED NOT DETECTED Final   Rhinovirus / Enterovirus NOT DETECTED NOT DETECTED Final   Influenza A NOT DETECTED NOT DETECTED Final   Influenza B NOT DETECTED NOT DETECTED Final   Parainfluenza Virus 1 NOT DETECTED NOT DETECTED Final   Parainfluenza Virus 2 NOT DETECTED NOT DETECTED Final   Parainfluenza Virus 3 NOT DETECTED NOT DETECTED Final   Parainfluenza Virus 4 NOT DETECTED NOT DETECTED Final   Respiratory Syncytial Virus NOT DETECTED NOT DETECTED Final   Bordetella pertussis NOT DETECTED NOT DETECTED Final   Bordetella Parapertussis NOT DETECTED NOT DETECTED Final   Chlamydophila pneumoniae NOT DETECTED NOT DETECTED Final   Mycoplasma pneumoniae NOT DETECTED NOT DETECTED Final    Comment: Performed at [facility], [address], [place], [place] [address]     Time coordinating discharge: Over 30 minutes  SIGNED:   Azucena Fallen, DO [facility] 02/27/2022, 12:43 PM Pager   If 7PM-7AM, please contact night-coverage [email or web address]

## 2022-02-27 NOTE — Progress Notes (Signed)
OT Cancellation Note  Patient Details Name: Charlene [name] MRN: [id number] DOB: 1965-08-15   Cancelled Treatment:    Reason Eval/Treat Not Completed: Other (comment) Pt from SNF and plan is to return to SNF. Will defer any OT needs to SNF.   Parv Manthey,HILLARY 02/27/2022, 1:24 PM Luisa Dago, OT/L   Acute OT Clinical Specialist Acute Rehabilitation Services Pager [phone] Office [phone]

## 2022-02-27 NOTE — TOC Transition Note (Signed)
Transition of Care [facility]) - CM/SW Discharge Note   Patient Details  Name: Charlene [name] MRN: [id number] Date of Birth: Jan 22, 1966  Transition of Care [facility]) CM/SW Contact:  Deatra Robinson, [place] Phone Number: 02/27/2022, 2:15 PM   Clinical Narrative:  Pt for dc back to [place] where she is LTC resident. Spoke to pt's RN at [facility], Adela Lank, who confirmed they are prepared to admit pt to room 412.   Pt's husband aware of dc and reports agreeable. Pt's husband requesting medical update, MD made aware. RN provided with number for report and dc summary faxed to [facility] at Jacqueline's request [phone]. SW signing off at dc.   Dellie Burns, MSW, LCSW [phone] (coverage)       Final next level of care: Skilled Nursing Facility Barriers to Discharge: No Barriers Identified   Patient Goals and CMS Choice     Choice offered to / list presented to : Spouse  Discharge Placement              Patient chooses bed at: [facility] Patient to be transferred to facility by: [facility] Name of family member notified: [name]/husband Patient and family notified of of transfer: 02/27/22  Discharge Plan and Services                                     Social Determinants of Health (SDOH) Interventions     Readmission Risk Interventions    09/29/2020   11:37 AM  Readmission Risk Prevention Plan  Transportation Screening Complete  PCP or Specialist Appt within 3-5 Days Complete  HRI or Home Care Consult Complete  Social Work Consult for Recovery Care Planning/Counseling Complete  Palliative Care Screening Complete  Medication Review Oceanographer) Complete

## 2022-02-27 NOTE — Progress Notes (Signed)
PT Cancellation Note  Patient Details Name: Charlene [name] MRN: [id number] DOB: Jul 18, 1966   Cancelled Treatment:     Pt from [place] [facility], d/c orders written.  We will defer therapy to the SNF.  Thanks,  Corinna Capra, PT, DPT  Acute Rehabilitation Secure chat is best for contact #[phone] office      Dimas Aguas 02/27/2022, 3:47 PM

## 2022-02-27 NOTE — Plan of Care (Signed)
  Problem: Education: Goal: Expressions of having a comfortable level of knowledge regarding the disease process will increase Outcome: Adequate for Discharge   Problem: Coping: Goal: Ability to adjust to condition or change in health will improve Outcome: Adequate for Discharge Goal: Ability to identify appropriate support needs will improve Outcome: Adequate for Discharge   Problem: Health Behavior/Discharge Planning: Goal: Compliance with prescribed medication regimen will improve Outcome: Adequate for Discharge   Problem: Medication: Goal: Risk for medication side effects will decrease Outcome: Adequate for Discharge   Problem: Clinical Measurements: Goal: Complications related to the disease process, condition or treatment will be avoided or minimized Outcome: Adequate for Discharge Goal: Diagnostic test results will improve Outcome: Adequate for Discharge   Problem: Safety: Goal: Verbalization of understanding the information provided will improve Outcome: Adequate for Discharge   Problem: Self-Concept: Goal: Level of anxiety will decrease Outcome: Adequate for Discharge Goal: Ability to verbalize feelings about condition will improve Outcome: Adequate for Discharge   Problem: Education: Goal: Knowledge of General Education information will improve Description: Including pain rating scale, medication(s)/side effects and non-pharmacologic comfort measures Outcome: Adequate for Discharge   Problem: Health Behavior/Discharge Planning: Goal: Ability to manage health-related needs will improve Outcome: Adequate for Discharge   Problem: Clinical Measurements: Goal: Ability to maintain clinical measurements within normal limits will improve Outcome: Adequate for Discharge Goal: Will remain free from infection Outcome: Adequate for Discharge Goal: Diagnostic test results will improve Outcome: Adequate for Discharge Goal: Respiratory complications will improve Outcome:  Adequate for Discharge Goal: Cardiovascular complication will be avoided Outcome: Adequate for Discharge   Problem: Activity: Goal: Risk for activity intolerance will decrease Outcome: Adequate for Discharge   Problem: Nutrition: Goal: Adequate nutrition will be maintained Outcome: Adequate for Discharge   Problem: Coping: Goal: Level of anxiety will decrease Outcome: Adequate for Discharge   Problem: Elimination: Goal: Will not experience complications related to bowel motility Outcome: Adequate for Discharge Goal: Will not experience complications related to urinary retention Outcome: Adequate for Discharge   Problem: Pain Managment: Goal: General experience of comfort will improve Outcome: Adequate for Discharge   Problem: Safety: Goal: Ability to remain free from injury will improve Outcome: Adequate for Discharge   Problem: Skin Integrity: Goal: Risk for impaired skin integrity will decrease Outcome: Adequate for Discharge   

## 2022-04-12 ENCOUNTER — Other Ambulatory Visit (HOSPITAL_COMMUNITY): Payer: Self-pay | Admitting: Family Medicine

## 2022-04-12 DIAGNOSIS — Z431 Encounter for attention to gastrostomy: Secondary | ICD-10-CM

## 2022-04-14 ENCOUNTER — Ambulatory Visit (HOSPITAL_COMMUNITY)
Admission: RE | Admit: 2022-04-14 | Discharge: 2022-04-14 | Disposition: A | Discharge status: Home / Self Care | Payer: Medicaid Other | Source: Ambulatory Visit | Attending: Family Medicine | Admitting: Family Medicine

## 2022-04-14 DIAGNOSIS — Z431 Encounter for attention to gastrostomy: Secondary | ICD-10-CM | POA: Diagnosis present

## 2022-04-14 HISTORY — PX: IR REPLC GASTRO/COLONIC TUBE PERCUT W/FLUORO: IMG2333

## 2022-04-14 MED ORDER — LIDOCAINE VISCOUS HCL 2 % MT SOLN
OROMUCOSAL | Status: DC | PRN
  Administered 2022-04-14: 10 mL

## 2022-04-14 MED ORDER — IOHEXOL 300 MG/ML  SOLN
50.0000 mL | Freq: Once | INTRAMUSCULAR | Status: AC | PRN
  Administered 2022-04-14: 15 mL

## 2022-04-14 MED ORDER — LIDOCAINE VISCOUS HCL 2 % MT SOLN
OROMUCOSAL | Status: AC
  Filled 2022-04-14: qty 15

## 2022-04-15 ENCOUNTER — Emergency Department (HOSPITAL_COMMUNITY)
Admission: EM | Admit: 2022-04-15 | Discharge: 2022-04-15 | Disposition: A | Discharge status: Skilled Nursing Facility | Payer: Medicaid Other | Attending: Emergency Medicine | Admitting: Emergency Medicine

## 2022-04-15 ENCOUNTER — Emergency Department (HOSPITAL_COMMUNITY): Payer: Medicaid Other

## 2022-04-15 ENCOUNTER — Encounter (HOSPITAL_COMMUNITY): Payer: Self-pay | Admitting: Emergency Medicine

## 2022-04-15 ENCOUNTER — Other Ambulatory Visit: Payer: Self-pay

## 2022-04-15 DIAGNOSIS — R4182 Altered mental status, unspecified: Secondary | ICD-10-CM | POA: Insufficient documentation

## 2022-04-15 DIAGNOSIS — N939 Abnormal uterine and vaginal bleeding, unspecified: Secondary | ICD-10-CM | POA: Diagnosis present

## 2022-04-15 DIAGNOSIS — N823 Fistula of vagina to large intestine: Secondary | ICD-10-CM

## 2022-04-15 DIAGNOSIS — K604 Rectal fistula: Secondary | ICD-10-CM | POA: Diagnosis not present

## 2022-04-15 DIAGNOSIS — Z7982 Long term (current) use of aspirin: Secondary | ICD-10-CM | POA: Diagnosis not present

## 2022-04-15 LAB — CBC WITH DIFFERENTIAL/PLATELET
Abs Immature Granulocytes: 0.01 10*3/uL (ref 0.00–0.07)
Basophils Absolute: 0 10*3/uL (ref 0.0–0.1)
Basophils Relative: 0 %
Eosinophils Absolute: 0.1 10*3/uL (ref 0.0–0.5)
Eosinophils Relative: 2 %
HCT: 33.6 % — ABNORMAL LOW (ref 36.0–46.0)
Hemoglobin: 10.5 g/dL — ABNORMAL LOW (ref 12.0–15.0)
Immature Granulocytes: 0 %
Lymphocytes Relative: 44 %
Lymphs Abs: 2.3 10*3/uL (ref 0.7–4.0)
MCH: 25.1 pg — ABNORMAL LOW (ref 26.0–34.0)
MCHC: 31.3 g/dL (ref 30.0–36.0)
MCV: 80.4 fL (ref 80.0–100.0)
Monocytes Absolute: 0.3 10*3/uL (ref 0.1–1.0)
Monocytes Relative: 7 %
Neutro Abs: 2.5 10*3/uL (ref 1.7–7.7)
Neutrophils Relative %: 47 %
Platelets: 226 10*3/uL (ref 150–400)
RBC: 4.18 MIL/uL (ref 3.87–5.11)
RDW: 14.6 % (ref 11.5–15.5)
WBC: 5.2 10*3/uL (ref 4.0–10.5)
nRBC: 0 % (ref 0.0–0.2)

## 2022-04-15 LAB — BASIC METABOLIC PANEL
Anion gap: 11 (ref 5–15)
BUN: 18 mg/dL (ref 6–20)
CO2: 26 mmol/L (ref 22–32)
Calcium: 9.5 mg/dL (ref 8.9–10.3)
Chloride: 105 mmol/L (ref 98–111)
Creatinine, Ser: 0.57 mg/dL (ref 0.44–1.00)
GFR, Estimated: >60 mL/min (ref >60)
Glucose, Bld: 94 mg/dL (ref 70–99)
Potassium: 4.1 mmol/L (ref 3.5–5.1)
Sodium: 142 mmol/L (ref 135–145)

## 2022-04-15 MED ORDER — IOHEXOL 300 MG/ML  SOLN
100.0000 mL | Freq: Once | INTRAMUSCULAR | Status: AC | PRN
  Administered 2022-04-15: 100 mL

## 2022-04-15 MED ORDER — IOHEXOL 350 MG/ML SOLN
75.0000 mL | Freq: Once | INTRAVENOUS | Status: AC | PRN
  Administered 2022-04-15: 75 mL via INTRAVENOUS

## 2022-04-15 NOTE — ED Provider Notes (Signed)
[facility] EMERGENCY DEPARTMENT Provider Note   CSN: [id number] Arrival date & time: 04/15/22  0147     History  Chief Complaint  Patient presents with   Vaginal Bleeding  Level 5 caveat due to altered mental status  Charlene [name] is a 56 y.o. female.   Vaginal Bleeding  Patient presents for nursing facility for concern for rectovaginal fistula.  Patient has a previous history of intracranial hemorrhage and is nonverbal at baseline.  It is reported that they had noted stool coming from her vagina.  No Other details known on arrival  Home Medications Prior to Admission medications   Medication Sig Start Date End Date Taking? Authorizing Provider  acetaminophen (TYLENOL) 160 MG/5ML liquid Place 160 mg into feeding tube in the morning.    [provider]  acetaminophen (TYLENOL) 650 MG suppository Place 650 mg rectally every 6 (six) hours as needed (temperature 100 or above).    [provider]  amLODipine (NORVASC) 10 MG tablet Place 1 tablet (10 mg total) into feeding tube daily. Patient taking differently: Place 10 mg into feeding tube in the morning. 04/16/21   Russella Dar, NP  aspirin 81 MG chewable tablet Place 81 mg into feeding tube in the morning.    [provider]  atorvastatin (LIPITOR) 40 MG tablet Place 1 tablet (40 mg total) into feeding tube daily. Patient taking differently: Place 40 mg into feeding tube at bedtime. 04/16/21   Russella Dar, NP  baclofen (OZOBAX) 1 mg/mL SOLN oral solution Place 5 mLs (5 mg total) into feeding tube at bedtime. 02/27/22   Azucena Fallen, MD  cefTRIAXone (ROCEPHIN) 1 g injection Inject 1 g into the muscle See admin instructions. Ceftriaxone 1g mixed with lidocaine - 1 dose given 02/24/22    [provider]  Chlorhexidine Gluconate 20 % SOLN Take 1 application  by mouth See admin instructions. Apply 1 application buccally at bedtime for oral care for 3 weeks (starting  02/17/22) - apply with oral care stick to tongue/mucosa    [provider]  Cholecalciferol (VITAMIN D-3) 5000 UNIT/ML LIQD Give 5,000 mLs by tube every Monday.    [provider]  diclofenac Sodium (VOLTAREN) 1 % GEL Apply 4 g topically 4 (four) times daily. Patient taking differently: Apply 4 g topically 2 (two) times daily. To right knee 04/15/21   Russella Dar, NP  doxazosin (CARDURA) 2 MG tablet Place 1 tablet (2 mg total) into feeding tube daily. Patient taking differently: Place 2 mg into feeding tube in the morning. 04/16/21   Russella Dar, NP  esomeprazole (NEXIUM) 20 MG packet Take 20 mg by mouth in the morning. Via tube    [provider]  ferrous sulfate 325 (65 FE) MG tablet Take 325 mg by mouth in the morning. Via tube    [provider]  fluticasone (FLONASE) 50 MCG/ACT nasal spray Place 2 sprays into both nostrils daily. Patient taking differently: Place 2 sprays into both nostrils in the morning. 04/16/21   Russella Dar, NP  levETIRAcetam (KEPPRA) 100 MG/ML solution Take 5 mLs (500 mg total) by mouth 2 (two) times daily. 02/27/22   Azucena Fallen, MD  lidocaine 4 % Place 1 patch onto the skin in the morning. To right knee    [provider]  Nutritional Supplements (ISOSOURCE 1.5 CAL) LIQD Give 360 mLs by tube See admin instructions. 40 cc / hr from (ON - 22:00, OFF -  06:00)    [provider]  nystatin (MYCOSTATIN) 100000 UNIT/ML suspension Use as directed 5 mLs in the mouth or throat See admin instructions. Apply 5 ml buccally two times daily for 3 weeks (starting 02/17/22) - apply thin layer with oral care stick to tongue    [provider]  polyethylene glycol (MIRALAX / GLYCOLAX) 17 g packet Place 17 g into feeding tube daily as needed for moderate constipation. 04/15/21   Russella Dar, NP  traMADol (ULTRAM) 50 MG tablet Place 0.5 tablets (25 mg total) into feeding tube every 12 (twelve) hours as needed  for moderate pain. Patient taking differently: Place 25 mg into feeding tube every 12 (twelve) hours as needed (pain). 04/15/21   Russella Dar, NP  Water For Irrigation, Sterile (FREE WATER) SOLN Place 200 mLs into feeding tube every 4 (four) hours. 04/15/21   Russella Dar, NP  metoprolol tartrate (LOPRESSOR) 25 MG tablet Take 50 mg by mouth daily. 03/10/14 06/03/20  Doris Cheadle, MD      Allergies    Patient has no known allergies.    Review of Systems   Review of Systems  Unable to perform ROS: Mental status change  Genitourinary:  Positive for vaginal bleeding.    Physical Exam Updated Vital Signs BP 109/75   Pulse 78   Temp (!) 97.5 F (36.4 C) (Rectal)   Resp 16   LMP 03/03/2014   SpO2 100%  Physical Exam CONSTITUTIONAL: Chronically ill-appearing, no acute distress HEAD: Normocephalic/atraumatic ENMT: Mucous membranes moist NECK:Old Trach scar noted SPINE/BACK:entire spine nontender CV: S1/S2 noted LUNGS: Lungs are clear to auscultation bilaterally, no apparent distress ABDOMEN: soft, nontender, G-tube in place Rectal/GU-nurse [place] O. and nurse [place] S. present for rectal and vaginal exam There is stool noted in the vaginal area.  No bleeding. No obvious fistula noted on digital rectal exam Stool was brown, no blood or melena NEURO: Pt is resting with eyes closed EXTREMITIES: Chronic contractures noted SKIN: warm, color normal  ED Results / Procedures / Treatments   Labs (all labs ordered are listed, but only abnormal results are displayed) Labs Reviewed  CBC WITH DIFFERENTIAL/PLATELET - Abnormal; Notable for the following components:      Result Value   Hemoglobin 10.5 (*)    HCT 33.6 (*)    MCH 25.1 (*)    All other components within normal limits  BASIC METABOLIC PANEL    EKG None  Radiology CT PELVIS W CONTRAST  Result Date: 04/15/2022 CLINICAL DATA:  Rectal fistula suspected, suspected feces coming from vagina. EXAM: CT PELVIS WITH  CONTRAST TECHNIQUE: Multidetector CT imaging of the pelvis was performed using the standard protocol following the bolus administration of intravenous contrast. RADIATION DOSE REDUCTION: This exam was performed according to the departmental dose-optimization program which includes automated exposure control, adjustment of the mA and/or kV according to patient size and/or use of iterative reconstruction technique. CONTRAST:  52mL OMNIPAQUE IOHEXOL 350 MG/ML SOLN, OMNIPAQUE IOHEXOL 300 MG/ML SOLN COMPARISON:  07/30/2021. FINDINGS: Urinary Tract:  No abnormality visualized. Bowel: No bowel obstruction. A moderate amount of retained stool is present in the colon and rectum. Diffuse rectal wall thickening is noted. A contrast tube is noted in the vagina and stent of the rectum. No definite air or fistula in the rectovaginal wall. Hyperdense attenuation is present in the distal rectum which may represent some contrast or be due to repositioning attempts. Vascular/Lymphatic: Aortic atherosclerosis. No pelvic lymphadenopathy. Reproductive: Uterus and bilateral adnexa  are within normal limits. A contrast tube, small quantity of contrast, and air are present in the vagina. Other:  A small amount of free fluid is present in the pelvis. Musculoskeletal: No acute osseous abnormality. Severe degenerative changes are noted at the hips bilaterally. IMPRESSION: 1. No definite evidence rectovaginal fistula. Minimal contrast is present in the rectum and vagina, however this may be due to tube repositioning in the tube is now located in the vagina. 2. Rectal wall thickening which may be infectious or inflammatory. 3. Aortic atherosclerosis. Electronically Signed   By: Thornell Sartorius M.D.   On: 04/15/2022 04:32   IR Replc Gastro/Colonic Tube Percut W/Fluoro  Result Date: 04/14/2022 INDICATION: Displaced percutaneous gastrostomy tube. EXAM: PERCUTANEOUS GASTROSTOMY TUBE REPLACEMENT UNDER FLUOROSCOPY MEDICATIONS: None  ANESTHESIA/SEDATION: None CONTRAST:  15 mL Omnipaque 300-administered into the gastric lumen. FLUOROSCOPY TIME:  Fluoroscopy Time: 6 seconds. COMPLICATIONS: None immediate. PROCEDURE: Informed written consent was obtained from the patient's husband after a thorough discussion of the procedural risks, benefits and alternatives. All questions were addressed. Maximal Sterile Barrier Technique was utilized including caps, mask, sterile gowns, sterile gloves, sterile drape, hand hygiene and skin antiseptic. A timeout was performed prior to the initiation of the procedure. An indwelling Foley catheter inserted via the pre-existing chronic gastrostomy tube tract was removed. A new 20 French balloon retention gastrostomy tube was advanced through the tract and into the stomach. The retention balloon was inflated with saline. The tube was injected with contrast material confirming positioning within the body of the stomach. IMPRESSION: Successful replacement of 20 French balloon retention gastrostomy tube under fluoroscopy. This tube is ready for use. Electronically Signed   By: Irish Lack M.D.   On: 04/14/2022 11:51    Procedures Procedures    Medications Ordered in ED Medications  iohexol (OMNIPAQUE) 350 MG/ML injection 75 mL (75 mLs Intravenous Contrast Given 04/15/22 0404)  iohexol (OMNIPAQUE) 300 MG/ML solution 100 mL (100 mLs Per Tube Contrast Given 04/15/22 0405)    ED Course/ Medical Decision Making/ A&P Clinical Course as of 04/15/22 0544  Fri Apr 15, 2022  0245 Patient with previous history of intracranial hemorrhage, is bedbound and nonverbal at baseline presents.  Concern for rectovaginal fistula.  History and exam are very difficult on arrival as patient is nonverbal.  Patient is contracted which also makes physical exam difficult.  Two female chaperones was present during physical exam.  She did have evidence of stool in her vagina. [DW]  0246 Discussed the case with on-call radiologist.   Best imaging modality would be CT pelvis with rectal and IV contrast.  This was ordered [DW]  0246 Husband of patient is at bedside.  Reports this occurred several months ago but they never found a cause.  Earlier tonight he was at her bedside at the facility when he noted what appeared to be stool in her vagina.  He also reports she had blood from her vagina.  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  No abscesses are noted.  From this perspective patient can be seen as an outpatient by general surgery.  I discussed this at length with husband and he is agreeable with plan. [DW]  3536 Although patient had no vaginal bleeding in the emergency department, husband reports she did have brisk bleeding while at the facility.  Hemoglobin is stable at this time.  I advised though that she would need gynecology evaluation if  she has any further vaginal bleeding.  Husband understands the plan [DW]    Clinical Course User Index [DW] Ripley Fraise, MD                           Medical Decision Making Amount and/or Complexity of Data Reviewed Labs: ordered. Radiology: ordered.  Risk Prescription drug management.           Final Clinical Impression(s) / ED Diagnoses Final diagnoses:  Rectovaginal fistula  Vaginal bleeding    Rx / DC Orders ED Discharge Orders     None         Ripley Fraise, MD 04/15/22 [phone]

## 2022-04-15 NOTE — Discharge Instructions (Addendum)
If she has any more vaginal bleeding, [facility]  If there is still any concerns for a rectovaginal fistula, she can follow-up with the [place] surgery team

## 2022-04-15 NOTE — ED Notes (Signed)
[facility] CALLED  °

## 2022-04-15 NOTE — ED Notes (Signed)
Called [facility] to check on transport.They verified the transport request. Staff/shift change is going on now, but it shouldn't be too much longer.

## 2022-04-15 NOTE — ED Triage Notes (Signed)
Pt BIB [facility] from [facility], husband noted pt having vaginal bleeding earlier in the day. Later tonight, staff noted feces coming from pt's vagina. Hx CVA, ?hx of vaginal fistula. Pt at her neuro baseline.

## 2022-05-16 ENCOUNTER — Ambulatory Visit (INDEPENDENT_AMBULATORY_CARE_PROVIDER_SITE_OTHER): Payer: Medicaid Other | Admitting: Neurology

## 2022-05-16 ENCOUNTER — Encounter: Payer: Self-pay | Admitting: Neurology

## 2022-05-16 VITALS — BP 108/69 | HR 79 | Ht 65.0 in

## 2022-05-16 DIAGNOSIS — G9341 Metabolic encephalopathy: Secondary | ICD-10-CM | POA: Diagnosis not present

## 2022-05-16 DIAGNOSIS — I610 Nontraumatic intracerebral hemorrhage in hemisphere, subcortical: Secondary | ICD-10-CM

## 2022-05-16 NOTE — Patient Instructions (Signed)
Decrease Keppra to 250 mg twice daily for one week then discontinue medication  Continue your other medications  Follow up as needed

## 2022-05-16 NOTE — Progress Notes (Signed)
[facility]  PATIENT: Charlene [name] DOB: February 13, 1966  REQUESTING CLINICIAN: Lacinda Axon HISTORY FROM: Mother, Husband and chart review  REASON FOR VISIT: Possible seizure   HISTORICAL  CHIEF COMPLAINT:  Chief Complaint  Patient presents with   New Patient (Initial Visit)    Rm 13. Accompanied by Joslyn Devon. NP/internal ED referral for seizures/scheduled with [facility] over phone.    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  Mother reports at baseline patient is aphasic this is after the hemorrhagic stroke.  She can follow simple commands, lift right arm,  squeeze my finger but at baseline she is nonverbal.  Mother reported in August she went to visit patient, she was feeding her, and she vomited.  Then she noted the in the next couple of days the patient was very tired and lethargic therefore she was taken to the ED.  Per chart note there is report of patient having right gaze deviation with chewing motion of the mouth and this was interpreted as a seizure.  She was given Ativan which make her very sleepy and had a EEG which showed the right focal slowing. I have spoken to both mother and husband and they reported that the right gaze preference and the chewing motion is normal for patient to do; this is a chronic behavior and is not seen a sign of seizure, she never had a seizure in the past.  During today's evaluation she had a right gaze preference and during the evaluation she was noted to have a chewing motion, but she will stop and look at you if you call her name.   Hospital Course  Charlene [name] is a 56 y.o. female with medical history significant of hypertension, anxiety, depression, hyperlipidemia, significant intracerebral hemorrhage with G-tube  and urinary retention presenting with altered mental status. Patient admitted as above with acute metabolic encephalopathy on chronic debility initially thought to be secondary to seizure given her mental status changes.  Neurology consulted initiated on Keppra with markedly improving symptoms now back to baseline.  Patient much more interactive and appropriate today appears to be back to baseline per mother at bedside, patient passed speech evaluation and has been placed back on her dysphagia 1 diet consistent with her previous history.  Her chronic comorbid conditions as below without acute incident or issue. Otherwise stable and agreeable for discharge back to facility for ongoing care and management     OTHER MEDICAL CONDITIONS: Hypertension, previous hemorrhagic stroke with aphasia, left hemiplegia  REVIEW OF SYSTEMS: Full 14 system review of systems performed and negative with exception of: unable to fully obtain due to mental status and patient being non verbal.   ALLERGIES: No Known Allergies  HOME MEDICATIONS: Outpatient Medications Prior to Visit  Medication Sig Dispense Refill   acetaminophen (TYLENOL) 650 MG suppository Place 650 mg rectally every 6 (six) hours as needed (temperature 100 or above).     amLODipine (NORVASC) 10 MG tablet Place 1 tablet (10 mg total) into feeding tube daily. (Patient taking differently: Place 10 mg into feeding tube in the morning.)     amoxicillin (AMOXIL) 500 MG capsule Take 500 mg by mouth 3 (three) times daily.     aspirin 81 MG chewable tablet Place 81 mg into feeding tube in the morning.     atorvastatin (LIPITOR)  40 MG tablet Place 1 tablet (40 mg total) into feeding tube daily. (Patient taking differently: Place 40 mg into feeding tube at bedtime.)     baclofen (OZOBAX) 1 mg/mL SOLN oral solution Place 5 mLs (5 mg total) into feeding tube at bedtime.     bisacodyl (FLEET) 10 MG/30ML ENEM Place 10 mg rectally once.     Chlorhexidine Gluconate  20 % SOLN Take 1 application  by mouth See admin instructions. Apply 1 application buccally at bedtime for oral care for 3 weeks (starting 02/17/22) - apply with oral care stick to tongue/mucosa     Cholecalciferol (VITAMIN D-3) 5000 UNIT/ML LIQD Give 5,000 mLs by tube every Monday.     diclofenac Sodium (VOLTAREN) 1 % GEL Apply 4 g topically 4 (four) times daily. (Patient taking differently: Apply 4 g topically 2 (two) times daily. To right knee)     doxazosin (CARDURA) 2 MG tablet Place 1 tablet (2 mg total) into feeding tube daily. (Patient taking differently: Place 2 mg into feeding tube in the morning.)     esomeprazole (NEXIUM) 20 MG packet Take 20 mg by mouth in the morning. Via tube     ferrous sulfate 325 (65 FE) MG tablet Take 325 mg by mouth in the morning. Via tube     fluticasone (FLONASE) 50 MCG/ACT nasal spray Place 2 sprays into both nostrils daily. (Patient taking differently: Place 2 sprays into both nostrils in the morning.)  2   levETIRAcetam (KEPPRA) 100 MG/ML solution Take 5 mLs (500 mg total) by mouth 2 (two) times daily. 300 mL 2   levETIRAcetam (KEPPRA) 100 MG/ML solution Take 100 mg by mouth daily.     nystatin (MYCOSTATIN) 100000 UNIT/ML suspension Use as directed 5 mLs in the mouth or throat See admin instructions. Apply 5 ml buccally two times daily for 3 weeks (starting 02/17/22) - apply thin layer with oral care stick to tongue     polyethylene glycol (MIRALAX / GLYCOLAX) 17 g packet Place 17 g into feeding tube daily as needed for moderate constipation. 14 each 0   acetaminophen (TYLENOL) 160 MG/5ML liquid Place 160 mg into feeding tube in the morning.     cefTRIAXone (ROCEPHIN) 1 g injection Inject 1 g into the muscle See admin instructions. Ceftriaxone 1g mixed with lidocaine - 1 dose given 02/24/22     lidocaine 4 % Place 1 patch onto the skin in the morning. To right knee     Nutritional Supplements (ISOSOURCE 1.5 CAL) LIQD Give 360 mLs by tube See admin instructions. 40  cc / hr from (ON - 22:00, OFF - 06:00)     traMADol (ULTRAM) 50 MG tablet Place 0.5 tablets (25 mg total) into feeding tube every 12 (twelve) hours as needed for moderate pain. (Patient taking differently: Place 25 mg into feeding tube every 12 (twelve) hours as needed (pain).) 30 tablet    Water For Irrigation, Sterile (FREE WATER) SOLN Place 200 mLs into feeding tube every 4 (four) hours.     No facility-administered medications prior to visit.    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   Hypertension    Infective  otitis externa of both ears    Middle ear effusion, bilateral 04/14/2021   Nausea & vomiting 02/21/2014   Tracheostomy dependent [facility])     PAST SURGICAL HISTORY: Past Surgical History:  Procedure Laterality Date   CESAREAN SECTION     ESOPHAGOGASTRODUODENOSCOPY N/A 09/10/2020   Procedure: ESOPHAGOGASTRODUODENOSCOPY (EGD);  Surgeon: Kinsinger, Arta Bruce, MD;  Location: [facility] ENDOSCOPY;  Service: General;  Laterality: N/A;   IR REPLACE G-TUBE SIMPLE WO FLUORO  07/30/2021   IR [place] GASTRO/COLONIC TUBE PERCUT W/FLUORO  02/16/2021   IR [place] GASTRO/COLONIC TUBE PERCUT W/FLUORO  10/23/2021   IR [place] GASTRO/COLONIC TUBE PERCUT W/FLUORO  04/14/2022   PEG PLACEMENT N/A 09/10/2020   Procedure: PERCUTANEOUS ENDOSCOPIC GASTROSTOMY (PEG) PLACEMENT;  Surgeon: Mickeal Skinner, MD;  Location: [facility] ENDOSCOPY;  Service: General;  Laterality: N/A;    FAMILY HISTORY: Family History  Problem Relation Age of Onset   Hypertension Father    Cancer Maternal Grandmother    Thyroid disease Paternal Uncle     SOCIAL HISTORY: Social History   Socioeconomic History   Marital status: Single     Spouse name: Not on file   Number of children: Not on file   Years of education: Not on file   Highest education level: Not on file  Occupational History   Not on file  Tobacco Use   Smoking status: Never   Smokeless tobacco: Never  Substance and Sexual Activity   Alcohol use: No   Drug use: No   Sexual activity: Yes    Birth control/protection: None  Other Topics Concern   Not on file  Social History Narrative   Not on file   Social Determinants of Health   Financial Resource Strain: Not on file  Food Insecurity: Not on file  Transportation Needs: Not on file  Physical Activity: Not on file  Stress: Not on file  Social Connections: Not on file  Intimate Partner Violence: Not on file    PHYSICAL EXAM  GENERAL EXAM/CONSTITUTIONAL: Vitals:  Vitals:   05/16/22 1540  BP: 108/69  Pulse: 79  Height: 5\' 5"  (1.651 m)   Body mass index is 17.72 kg/m. Wt Readings from Last 3 Encounters:  02/27/22 106 lb 7.7 oz (48.3 kg)  04/11/21 149 lb 14.6 oz (68 kg)  08/19/20 135 lb (61.2 kg)    NEUROLOGIC: MENTAL STATUS:      No data to display         Patient is awake, will track examiner, can show a thumb up, she can close eyes on commands. She has a right gaze preference She has left hemiplegia  She is bedbound  She will withdraw to stimulus in the RLE She is in a hospital  bed      DIAGNOSTIC DATA (LABS, IMAGING, TESTING) - I reviewed patient records, labs, notes, testing and imaging myself where available.  Lab Results  Component Value Date   WBC 5.2 04/15/2022   HGB 10.5 (L) 04/15/2022   HCT 33.6 (L) 04/15/2022   MCV 80.4 04/15/2022   PLT 226 04/15/2022      Component Value Date/Time   NA 142 04/15/2022 0150   K 4.1 04/15/2022 0150   CL 105 04/15/2022 0150   CO2 26 04/15/2022 0150   GLUCOSE 94 04/15/2022 0150   BUN 18 04/15/2022 0150   CREATININE 0.57 04/15/2022 0150   CALCIUM 9.5 04/15/2022 0150   PROT 6.3 (L) 02/25/2022 0346   ALBUMIN 3.1 (L)  02/25/2022 0346   AST 17  02/25/2022 0346   ALT 17 02/25/2022 0346   ALKPHOS 70 02/25/2022 0346   BILITOT 0.5 02/25/2022 0346   GFRNONAA >60 04/15/2022 0150   GFRAA >90 02/21/2014 1218   Lab Results  Component Value Date   CHOL 265 (H) 08/27/2020   HDL 72 08/27/2020   LDLCALC 167 (H) 08/27/2020   TRIG 119 09/03/2020   Lab Results  Component Value Date   HGBA1C 6.0 (H) 08/27/2020   Lab Results  Component Value Date   VITAMINB12 1,243 (H) 03/19/2021   Lab Results  Component Value Date   TSH 0.544 02/28/2014    EEG 02/25/2022 -Intermittent slow, generalized and lateralized right hemisphere  MRI Brain 02/25/2022 1. No acute intracranial abnormality or significant interval change. 2. Expected evolution of remote hemorrhagic infarct of the right thalamus and posterior lentiform nucleus. 3. Remote lacunar infarcts of the left thalamus are new since the prior exam. 4. Focal encephalomalacia along the undersurface of the right temporal lobe could be a seizure focus. 5. Left mastoid effusion. No obstructing nasopharyngeal lesion is present.  I personally reviewed brain Images and previous EEG reports.   ASSESSMENT AND PLAN  56 y.o. 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  Per chart review patient had right gaze deviation and lipsmacking which was interpreted as seizure and given benzodiazepine.  But per history and on exam today patient does have a right gaze preference and she also have a chewing motion that is normal per family.  She never had any previous seizure. Based on this information I think is reasonable to decrease and stop the Keppra since family has reported  daytime sleepiness since starting the medication and there are no clear evidence of seizures.  Plan will be to decrease the Keppra to 250 mg twice  daily for 1 week then stop the medication.  Continue to follow with your primary care doctor and return as needed     1. Nontraumatic subcortical hemorrhage of right cerebral hemisphere (HCC)   2. Acute metabolic encephalopathy     Patient Instructions  Decrease Keppra to 250 mg twice daily for one week then discontinue medication  Continue your other medications  Follow up as needed    [place], patients with seizures are not allowed to drive until they have been seizure-free for six months.  Other recommendations include using caution when using heavy equipment or power tools. Avoid working on ladders or at heights. Take showers instead of baths.  Do not swim alone.  Ensure the water temperature is not too high on the home water heater. Do not go swimming alone. Do not lock yourself in a room alone (i.e. bathroom). When caring for infants or small children, sit down when holding, feeding, or changing them to minimize risk of injury to the child in the event you have a seizure. Maintain good sleep hygiene. Avoid alcohol.  Also recommend adequate sleep, hydration, good diet and minimize stress.   During the Seizure  - First, ensure adequate ventilation and place patients on the floor on their left side  Loosen clothing around the neck and ensure the airway is patent. If the patient is clenching the teeth, do not force the mouth open with any object as this can cause severe damage - Remove all items from the surrounding that can be hazardous. The patient may  be oblivious to what's happening and may not even know what he or she is doing. If the patient is confused and wandering, either gently guide him/her away and block access to outside areas - Reassure the individual and be comforting - Call 911. In most cases, the seizure ends before EMS arrives. However, there are cases when seizures may last over 3 to 5 minutes. Or the individual may have developed breathing  difficulties or severe injuries. If a pregnant patient or a person with diabetes develops a seizure, it is prudent to call an ambulance. - Finally, if the patient does not regain full consciousness, then call EMS. Most patients will remain confused for about 45 to 90 minutes after a seizure, so you must use judgment in calling for help. - Avoid restraints but make sure the patient is in a bed with padded side rails - Place the individual in a lateral position with the neck slightly flexed; this will help the saliva drain from the mouth and prevent the tongue from falling backward - Remove all nearby furniture and other hazards from the area - Provide verbal assurance as the individual is regaining consciousness - Provide the patient with privacy if possible - Call for help and start treatment as ordered by the caregiver   After the Seizure (Postictal Stage)  After a seizure, most patients experience confusion, fatigue, muscle pain and/or a headache. Thus, one should permit the individual to sleep. For the next few days, reassurance is essential. Being calm and helping reorient the person is also of importance.  Most seizures are painless and end spontaneously. Seizures are not harmful to others but can lead to complications such as stress on the lungs, brain and the heart. Individuals with prior lung problems may develop labored breathing and respiratory distress.     No orders of the defined types were placed in this encounter.   No orders of the defined types were placed in this encounter.   Return if symptoms worsen or fail to improve.   I have spent a total of 60 minutes dedicated to this patient today, preparing to see patient, performing a medically appropriate examination and evaluation, ordering tests and/or medications and procedures, and counseling and educating the patient/family/caregiver; independently interpreting result and communicating results to the  family/patient/caregiver; and documenting clinical information in the electronic medical record.    Windell Norfolk, MD 05/16/2022, 9:25 PM  [facility] [address], [address] [place], [place] [address] [phone]

## 2022-05-17 NOTE — Progress Notes (Unsigned)
GYNECOLOGY OFFICE VISIT NOTE  History:   Charlene [name] is a 56 y.o. 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   She was in the hospital 3-4 weeks ago and had a large bowel movement. Some stool went into the vagina. Husband reports she hadn't been turned very often during this admission. It then continued to come out but was watery for several days. He has not noticed it since that time. He reports it happened to her before at a previous hospital admission as well.   She lives in a nursing home and has a feeding tube. She can take some PO but she has lost weight so the feeding tube is to help. She is responsive to questions.   He denies any known history of her having major obstetric trauma. They have 2 children together - both vaginal but she has 6 children total.   She has no history of pelvic radiation or pelvic malignancy.      Past Medical History:  Diagnosis Date   Acute cystitis without hematuria    Acute hypoxemic respiratory failure (HCC)    Acute otitis media with effusion of right ear    Altered mental status    Chest pain 02/21/2014   CVA (cerebral vascular accident) ([place])    Headache 02/21/2014   HTN (hypertension), malignant 02/21/2014   Hypertension    Infective otitis externa of both ears    Middle ear effusion, bilateral 04/14/2021   Nausea & vomiting 02/21/2014   Tracheostomy dependent [facility])     Past Surgical History:  Procedure Laterality Date   CESAREAN SECTION     ESOPHAGOGASTRODUODENOSCOPY N/A 09/10/2020   Procedure: ESOPHAGOGASTRODUODENOSCOPY (EGD);  Surgeon: Kinsinger, Arta Bruce, MD;  Location: [facility];  Service: General;  Laterality: N/A;   IR REPLACE G-TUBE SIMPLE WO FLUORO  07/30/2021   IR [place] GASTRO/COLONIC TUBE PERCUT W/FLUORO  02/16/2021   IR [place] GASTRO/COLONIC TUBE PERCUT W/FLUORO  10/23/2021   IR [place] GASTRO/COLONIC TUBE PERCUT W/FLUORO  04/14/2022   PEG PLACEMENT N/A 09/10/2020   Procedure: PERCUTANEOUS ENDOSCOPIC GASTROSTOMY (PEG)  PLACEMENT;  Surgeon: Mickeal Skinner, MD;  Location: [facility];  Service: General;  Laterality: N/A;    The following portions of the patient's history were reviewed and updated as appropriate: allergies, current medications, past family history, past medical history, past social history, past surgical history and problem list.   Health Maintenance:   No pap on file.   Review of Systems:  Pertinent items noted in HPI and remainder of comprehensive ROS otherwise negative.  Physical Exam:  BP 111/72   Pulse 86   Ht 5\' 5"  (1.651 m)   LMP 03/03/2014   BMI 17.72 kg/m  CONSTITUTIONAL: Well-developed, well-nourished female in no acute distress.  HEENT:  Normocephalic, atraumatic. External right and left ear normal. No scleral icterus.  NECK: Normal range of motion, supple, no masses noted on observation SKIN: No rash noted. Not diaphoretic. No erythema. No pallor.  PELVIC:  Exam limited by pt's limited mobility. Exam done in her chair which did allow her to lay recumbent. Blind pap smear done with brushes. Limited bimanual done but no palpable fistula however exam again was limited.   Labs and Imaging No results found for this or any previous visit (from the past 168 hour(s)). No results found.  Assessment and Plan:  Diagnoses and all orders for this visit:  Rectovaginal fistula Reviewed exam is not definitive, but my suspicion is low. Husband would prefer definitive imaging  and would prefer to know if she has a fistula. Spoke with [facility], Dr. Eppie Gibson, who recommends CT with rectal contrast. Ordered placed.  -     CT PELVIS W CONTRAST; Future  Cervical cancer screening Blind pap with HPV swab done today to limit exams. Would not repeat for another 5y at most depending on her status at that time.  -     Cytology - PAP     Routine preventative health maintenance measures emphasized. Please refer to After Visit Summary for other counseling recommendations.   Return if  symptoms worsen or fail to improve.  Milas Hock, MD, FACOG Obstetrician & Gynecologist, [facility] for [facility], [facility]

## 2022-05-18 ENCOUNTER — Encounter: Payer: Self-pay | Admitting: Obstetrics and Gynecology

## 2022-05-18 ENCOUNTER — Ambulatory Visit: Payer: Medicaid Other | Admitting: Obstetrics and Gynecology

## 2022-05-18 ENCOUNTER — Other Ambulatory Visit (HOSPITAL_COMMUNITY)
Admission: RE | Admit: 2022-05-18 | Discharge: 2022-05-18 | Disposition: A | Discharge status: Home / Self Care | Payer: Medicaid Other | Source: Ambulatory Visit | Attending: Obstetrics and Gynecology | Admitting: Obstetrics and Gynecology

## 2022-05-18 VITALS — BP 111/72 | HR 86 | Ht 65.0 in

## 2022-05-18 DIAGNOSIS — N823 Fistula of vagina to large intestine: Secondary | ICD-10-CM

## 2022-05-18 DIAGNOSIS — Z124 Encounter for screening for malignant neoplasm of cervix: Secondary | ICD-10-CM

## 2022-05-23 LAB — CYTOLOGY - PAP
Comment: NEGATIVE
Diagnosis: NEGATIVE
Diagnosis: REACTIVE
High risk HPV: NEGATIVE

## 2022-12-07 ENCOUNTER — Emergency Department (HOSPITAL_COMMUNITY)
Admission: EM | Admit: 2022-12-07 | Discharge: 2022-12-07 | Disposition: A | Discharge status: Skilled Nursing Facility | Payer: Medicaid Other | Attending: Student | Admitting: Student

## 2022-12-07 ENCOUNTER — Other Ambulatory Visit (HOSPITAL_COMMUNITY): Payer: Self-pay | Admitting: Family Medicine

## 2022-12-07 ENCOUNTER — Emergency Department (HOSPITAL_COMMUNITY): Payer: Medicaid Other

## 2022-12-07 DIAGNOSIS — Z79899 Other long term (current) drug therapy: Secondary | ICD-10-CM | POA: Insufficient documentation

## 2022-12-07 DIAGNOSIS — Z431 Encounter for attention to gastrostomy: Secondary | ICD-10-CM | POA: Diagnosis present

## 2022-12-07 DIAGNOSIS — Z8673 Personal history of transient ischemic attack (TIA), and cerebral infarction without residual deficits: Secondary | ICD-10-CM | POA: Diagnosis not present

## 2022-12-07 DIAGNOSIS — Z931 Gastrostomy status: Secondary | ICD-10-CM

## 2022-12-07 DIAGNOSIS — I10 Essential (primary) hypertension: Secondary | ICD-10-CM | POA: Insufficient documentation

## 2022-12-07 DIAGNOSIS — R633 Feeding difficulties, unspecified: Secondary | ICD-10-CM

## 2022-12-07 MED ORDER — DIATRIZOATE MEGLUMINE & SODIUM 66-10 % PO SOLN
ORAL | Status: AC
  Filled 2022-12-07: qty 30

## 2022-12-07 NOTE — ED Provider Notes (Signed)
[place] EMERGENCY DEPARTMENT AT [facility] Provider Note  CSN: [id number] Arrival date & time: 12/07/22 1018  Chief Complaint(s) G-tube Problem  HPI Charlene [name] is a 57 y.o. female with PMH previous CVA, nonverbal with G-tube in place for nighttime feeding who presents emergency department from her facility due to concern for a possible G-tube displacement.  Facility staff apparently told EMS that they noticed that the G-tube catheter length was longer than normal and sent her to the emergency department for further evaluation.  No attempts to use the G-tube, aspirate or flush the catheter were made prior to arrival.   Past Medical History 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   Hypertension    Infective otitis externa of both ears    Middle ear effusion, bilateral 04/14/2021   Nausea & vomiting 02/21/2014   Tracheostomy dependent [facility])    Patient Active Problem List   Diagnosis Date Noted   Acute metabolic encephalopathy 02/25/2022   Seizure (HCC) 02/24/2022   History of urinary retention-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 04/16/2021   Osteoarthritis of right knee 04/16/2021   HLD (hyperlipidemia) 04/16/2021   Superficial Thrombophlebitis of arm, right (RESOLVED) 04/16/2021   Severe Dental disease 04/16/2021   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    Pseudomonas urinary tract infection    Nonspecific abnormal electrocardiogram (ECG) (EKG)    Knee pain, right    Constipation    Ileus, unspecified (HCC)    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 Authorizing Provider  acetaminophen (TYLENOL) 650 MG suppository Place 650 mg rectally every 6 (six) hours as needed (temperature 100 or above).    [provider]  amLODipine (NORVASC) 10 MG tablet Place 1 tablet (10 mg total) into feeding tube daily. Patient taking differently: Place 10 mg into feeding tube in the morning. 04/16/21   Russella Dar, NP  amoxicillin (AMOXIL) 500 MG capsule Take 500 mg by mouth 3 (three) times daily.    [provider]  aspirin 81 MG chewable tablet Place 81 mg into feeding tube in the morning.    [provider]  atorvastatin (LIPITOR) 40 MG tablet Place 1 tablet (40 mg total) into feeding tube daily. Patient taking differently: Place 40 mg into feeding tube at bedtime. 04/16/21   Russella Dar, NP  baclofen (OZOBAX) 1 mg/mL SOLN oral solution Place 5 mLs (5 mg total) into feeding tube at bedtime. 02/27/22   Azucena Fallen, MD  Baclofen 5 MG TABS Take by mouth daily. 04/10/22   [provider]  bisacodyl (FLEET) 10 MG/30ML ENEM Place 10 mg rectally once.    [provider]  Chlorhexidine Gluconate 20 % SOLN Take 1 application  by mouth See admin instructions. Apply 1 application  buccally at bedtime for oral care for 3 weeks (starting 02/17/22) - apply with oral care stick to tongue/mucosa    [provider]  Cholecalciferol (VITAMIN D-3) 5000 UNIT/ML LIQD Give 5,000 mLs by tube every Monday.    [provider]  diclofenac Sodium (VOLTAREN) 1 % GEL Apply 4 g topically 4 (four) times daily. Patient taking differently: Apply 4 g topically 2 (two) times daily. To right knee 04/15/21   Russella Dar, NP   doxazosin (CARDURA) 2 MG tablet Place 1 tablet (2 mg total) into feeding tube daily. Patient taking differently: Place 2 mg into feeding tube in the morning. 04/16/21   Russella Dar, NP  esomeprazole (NEXIUM) 20 MG packet Take 20 mg by mouth in the morning. Via tube    [provider]  ferrous sulfate 325 (65 FE) MG tablet Take 325 mg by mouth in the morning. Via tube    [provider]  fluticasone (FLONASE) 50 MCG/ACT nasal spray Place 2 sprays into both nostrils daily. Patient taking differently: Place 2 sprays into both nostrils in the morning. 04/16/21   Russella Dar, NP  levETIRAcetam (KEPPRA) 100 MG/ML solution Take 5 mLs (500 mg total) by mouth 2 (two) times daily. 02/27/22   Azucena Fallen, MD  levETIRAcetam (KEPPRA) 100 MG/ML solution Take 100 mg by mouth daily.    [provider]  nystatin (MYCOSTATIN) 100000 UNIT/ML suspension Use as directed 5 mLs in the mouth or throat See admin instructions. Apply 5 ml buccally two times daily for 3 weeks (starting 02/17/22) - apply thin layer with oral care stick to tongue    [provider]  polyethylene glycol (MIRALAX / GLYCOLAX) 17 g packet Place 17 g into feeding tube daily as needed for moderate constipation. 04/15/21   Russella Dar, NP  potassium chloride 20 MEQ/15ML (10%) SOLN Take 20 mEq by mouth daily. 03/01/22   [provider]  metoprolol tartrate (LOPRESSOR) 25 MG tablet Take 50 mg by mouth daily. 03/10/14 06/03/20  Doris Cheadle, MD                                                                                                                                    Past Surgical History Past Surgical History:  Procedure Laterality Date   CESAREAN SECTION     ESOPHAGOGASTRODUODENOSCOPY N/A 09/10/2020   Procedure: ESOPHAGOGASTRODUODENOSCOPY (EGD);  Surgeon: Kinsinger, De Blanch, MD;  Location[facility] ENDOSCOPY;  Service: General;  Laterality: N/A;   IR REPLACE G-TUBE SIMPLE WO FLUORO   07/30/2021   IR REPLC GASTRO/COLONIC TUBE PERCUT W/FLUORO  02/16/2021   IR REPLC GASTRO/COLONIC TUBE PERCUT W/FLUORO  10/23/2021   IR REPLC GASTRO/COLONIC TUBE PERCUT W/FLUORO  04/14/2022   PEG PLACEMENT N/A 09/10/2020   Procedure: PERCUTANEOUS ENDOSCOPIC GASTROSTOMY (PEG) PLACEMENT;  Surgeon: Rodman Pickle, MD;  Location: [facility] ENDOSCOPY;  Service: General;  Laterality: N/A;   Family History  Family History  Problem Relation Age of Onset   Hypertension Father    Cancer Maternal Grandmother    Thyroid disease Paternal Uncle     Social History Social History   Tobacco Use   Smoking status: Never   Smokeless tobacco: Never  Vaping Use   Vaping Use: Never used  Substance Use Topics   Alcohol use: No   Drug use: No   Allergies Patient has no known allergies.  Review of Systems Review of Systems  All other systems reviewed and are negative.   Physical Exam Vital Signs  I have reviewed the triage vital signs Ht 5\' 5"  (1.651 m)   Wt 48.3 kg   LMP 03/03/2014   BMI 17.72 kg/m   Physical Exam Vitals and nursing note reviewed.  Constitutional:      General: She is not in acute distress.    Appearance: She is well-developed.  HENT:     Head: Normocephalic and atraumatic.  Eyes:     Conjunctiva/sclera: Conjunctivae normal.  Cardiovascular:     Rate and Rhythm: Normal rate and regular rhythm.     Heart sounds: No murmur heard. Pulmonary:     Effort: Pulmonary effort is normal. No respiratory distress.     Breath sounds: Normal breath sounds.  Abdominal:     Palpations: Abdomen is soft.     Tenderness: There is no abdominal tenderness.     Comments: G-tube in place with no surrounding erythema  Musculoskeletal:        General: No swelling.     Cervical back: Neck supple.  Skin:    General: Skin is warm and dry.     Capillary Refill: Capillary refill takes less than 2 seconds.  Neurological:     Mental Status: She is alert.  Psychiatric:        Mood and Affect:  Mood normal.     ED Results and Treatments Labs (all labs ordered are listed, but only abnormal results are displayed) Labs Reviewed - No data to display                                                                                                                        Radiology No results found.  Pertinent labs & imaging results that were available during my care of the patient were reviewed by me and considered in my medical decision making (see MDM for details).  Medications Ordered in ED Medications - No data to display  Procedures Procedures  (including critical care time)  Medical Decision Making / ED Course   This patient presents to the ED for concern of G-tube problem, this involves an extensive number of treatment options, and is a complaint that carries with it a high risk of complications and morbidity.  The differential diagnosis includes G-tube malposition, G-tube migration, appropriate placement  MDM: Patient seen emergency room for evaluation of a G-tube problem.  Physical exam reveals a relatively well-appearing healthy G-tube rubber catheter with no surrounding erythema or purulence.  G-tube study showing presence of tube in the proximal small bowel past the pylorus.  The G-tube is being used primarily for feeding and thus this is an appropriate position for this tube and thus we will not reposition the tube.  Patient then discharged back to her facility.   Additional history obtained: -External records from outside source obtained and reviewed including: Chart review including previous notes, labs, imaging, consultation notes   Lab Tests: -I ordered, reviewed, and interpreted labs.   The pertinent results include:   Labs Reviewed - No data to display    Imaging Studies ordered: I ordered imaging studies including XR PEG tube  study I independently visualized and interpreted imaging. I agree with the radiologist interpretation   Medicines ordered and prescription drug management: No orders of the defined types were placed in this encounter.   -I have reviewed the patients home medicines and have made adjustments as needed  Critical interventions none   Cardiac Monitoring: The patient was maintained on a cardiac monitor.  I personally viewed and interpreted the cardiac monitored which showed an underlying rhythm of: NSR  Social Determinants of Health:  Factors impacting patients care include: Lives in facility   Reevaluation: After the interventions noted above, I reevaluated the patient and found that they have :stayed the same  Co morbidities that complicate the patient evaluation  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   Hypertension    Infective otitis externa of both ears    Middle ear effusion, bilateral 04/14/2021   Nausea & vomiting 02/21/2014   Tracheostomy dependent (HCC)       Dispostion: I considered admission for this patient, but with G-tube in appropriate position she is safe for discharge with outpatient follow-up back to facility     Final Clinical Impression(s) / ED Diagnoses Final diagnoses:  None     @PCDICTATION @    Glendora Score, MD 12/07/22 1733

## 2022-12-07 NOTE — ED Triage Notes (Signed)
From [facility]. Bed bound. Nonverbal. Pt is here for g-tube problem. Staff noticed g-tube is longer than usual. No swelling or redness around site.

## 2022-12-07 NOTE — ED Notes (Signed)
Domenik Trice called ptar to [employer]

## 2022-12-08 ENCOUNTER — Other Ambulatory Visit (HOSPITAL_COMMUNITY): Payer: Self-pay | Admitting: Family Medicine

## 2022-12-08 ENCOUNTER — Ambulatory Visit (HOSPITAL_COMMUNITY)
Admission: RE | Admit: 2022-12-08 | Discharge: 2022-12-08 | Disposition: A | Discharge status: Home / Self Care | Payer: Medicaid Other | Source: Ambulatory Visit | Attending: Family Medicine | Admitting: Family Medicine

## 2022-12-08 DIAGNOSIS — R633 Feeding difficulties, unspecified: Secondary | ICD-10-CM | POA: Insufficient documentation

## 2022-12-08 HISTORY — PX: IR REPLACE G-TUBE SIMPLE WO FLUORO: IMG2323

## 2022-12-08 MED ORDER — LIDOCAINE VISCOUS HCL 2 % MT SOLN
OROMUCOSAL | Status: AC
  Filled 2022-12-08: qty 15

## 2023-06-12 ENCOUNTER — Other Ambulatory Visit (HOSPITAL_COMMUNITY): Payer: Self-pay | Admitting: Diagnostic Radiology

## 2023-06-12 ENCOUNTER — Ambulatory Visit (HOSPITAL_COMMUNITY)
Admission: RE | Admit: 2023-06-12 | Discharge: 2023-06-12 | Disposition: A | Discharge status: Home / Self Care | Payer: Medicare Other | Source: Ambulatory Visit | Attending: Radiology | Admitting: Radiology

## 2023-06-12 DIAGNOSIS — R633 Feeding difficulties, unspecified: Secondary | ICD-10-CM | POA: Diagnosis not present

## 2023-06-12 DIAGNOSIS — Z431 Encounter for attention to gastrostomy: Secondary | ICD-10-CM | POA: Diagnosis present

## 2023-06-12 DIAGNOSIS — R1310 Dysphagia, unspecified: Secondary | ICD-10-CM | POA: Diagnosis not present

## 2023-06-12 HISTORY — PX: IR REPLACE G-TUBE SIMPLE WO FLUORO: IMG2323

## 2023-06-12 MED ORDER — LIDOCAINE VISCOUS HCL 2 % MT SOLN
OROMUCOSAL | Status: AC
  Filled 2023-06-12: qty 15

## 2023-06-12 MED ORDER — LIDOCAINE VISCOUS HCL 2 % MT SOLN
15.0000 mL | Freq: Once | OROMUCOSAL | Status: AC
  Administered 2023-06-12: 1 mL via OROMUCOSAL

## 2023-06-12 NOTE — Procedures (Signed)
Successful bedside routine exchange of 20 Fr balloon retention G-tube. Balloon inflated with 10 cc NS and retention disc cinched to skin, gastric contents noted in tube. Flushed easily with 10 cc NS.  Patient will return in 6 months for routine G-tube exchange.  Lynnette Caffey, PA-C

## 2023-12-08 ENCOUNTER — Inpatient Hospital Stay (HOSPITAL_COMMUNITY)
Admission: RE | Admit: 2023-12-08 | Discharge: 2023-12-08 | Disposition: A | Discharge status: Home / Self Care | Payer: Medicare Other | Source: Ambulatory Visit | Attending: Diagnostic Radiology | Admitting: Diagnostic Radiology

## 2023-12-08 ENCOUNTER — Encounter (HOSPITAL_COMMUNITY): Payer: Self-pay | Admitting: Diagnostic Radiology

## 2023-12-08 DIAGNOSIS — R633 Feeding difficulties, unspecified: Secondary | ICD-10-CM

## 2023-12-25 NOTE — Progress Notes (Signed)
 Otolaryngology Clinic Note  HPI:    New Patient (PT presents for trouble breathing through nose when sleeping. Wax buildup in right ear )    Charlene [name] is a 58 y.o. female who presents as a new consult patient, referred by Feliciano Devoria LABOR, MD, for evaluation and treatment of mouth breathing. She is accompanied by her mother.  Patient has past medical history significant for CVA in 2022 which necessitated the use of a tracheostomy tube for less than a month and a feeding tube, which is still in place. She was hospitalized for 8 months following the stroke. Since then, she has been able to breathe through her nose and mouth without any oxygenation issues.  Her diet is primarily tube-fed, with only family members assisting her with feeding due to swallowing difficulties. She occasionally chokes on pureed food. It has been some time since her last consultation with a speech therapist for swallowing assessment.  She has been exhibiting signs of mouth breathing for approximately a month, particularly when her head is tilted backwards. This is often accompanied by a release of air from her mouth upon opening. She does not exhibit snoring. Additionally, she experiences drooling, especially when lying on her back.  [name] notes that she was recently seen by provider and had wax impaction removed from the left ear.  Due to the firm quality of the wax in the right ear, it was unable to be removed at the same time, and patient's caregivers were advised to apply emollients and ceruminolytic's to the right ear.  [name] states that unfortunately, this has not yet been done and subsequently, she feels as though patient's hearing is reduced in the right ear secondary to wax impaction.  PMH/Meds/All/SocHx/FamHx/ROS:   Medical History[1]  Surgical History[2]  No family history of bleeding disorders, wound healing problems or difficulty with anesthesia.   Social History    Socioeconomic History  . Marital status: Married    Spouse name: Not on file  . Number of children: Not on file  . Years of education: Not on file  . Highest education level: Not on file  Occupational History  . Not on file  Tobacco Use  . Smoking status: Unknown  . Smokeless tobacco: Not on file  Substance and Sexual Activity  . Alcohol use: Never  . Drug use: Not on file  . Sexual activity: Not on file  Other Topics Concern  . Not on file  Social History Narrative  . Not on file   Social Drivers of Health   Food Insecurity: Not on file  Transportation Needs: Not on file  Safety: Not on file  Living Situation: Not on file    Current Medications[3]  A complete ROS was performed with pertinent positives/negatives noted in the HPI. The remainder of the ROS are negative.    Physical Exam:    Temp 97.8 F (36.6 C) (Temporal)   Ht 1.626 m (5' 4)   Wt 59.9 kg (132 lb)   BMI 22.66 kg/m    General: Well developed, well nourished.   Head/Face: Normocephalic, atraumatic. No scars or lesions. No sinus tenderness. Facial nerve intact and equal bilaterally.   Eyes: Pupils are equal, round and reactive to light. Conjunctiva and lids are normal. Normal extraocular mobility.   Ears:   Right: Pinna and external meatus normal, normal ear canal skin and caliber with impacted cerumen obstructing view of tympanic membrane   Left: Pinna and external meatus normal, normal ear canal skin  and caliber without excessive cerumen or drainage. Tympanic membrane intact without effusion or infection.   Nose: No gross deformity or lesions. No purulent discharge. Septum mildly deviated to the right, 2+ turbinate hypertrophy with allergic appearing mucosa.  Mouth/Oropharynx: Lips normal, teeth and gums normal with good dentition, normal oral vestibule. Normal floor of mouth, tongue and oral mucosa, no mucosal lesions, ulcer or mass, normal tongue mobility. Uvula midline. Hard and soft palate  normal with normal mobility. Symmetric tonsils, no erythema or exudate.  Larynx: See TFL if applicable  Nasopharynx: See TFL if applicable  Neck: Trachea midline. No masses. No crepitus. Normal thyroid glad palpation without thyromegaly or nodules palpated. Salivary glands normal to palpation without swelling, erythema or mass.   Lymphatic: No lymphadenopathy in the neck.  Respiratory: No stridor or distress.  Extremities: No edema or cyanosis. Warm and well-perfused.  Neurologic: Wheelchair-bound, unable to follow commands consistently  Psychiatric:  No unusual anxiety or evidence of depression. Appropriate affect.    Independent Review of Additional Tests or Records:  Discharge summary from admission in 2022 reviewed  Results of last MBS reviewed: CHL IP CLINICAL IMPRESSIONS 01/22/2021  Clinical Impression Pt shows improvements from initial MBS completed on  6/3.She still has some reduced oral control, but is able to now sip from a  straw consistently. Thin liquids spill anteriorly from her mouth more with  cup sips and she does not initiate mastication with a small bite of solid,  which was subsequently removed from between her lips. Swallowing initiates  at the valleculae now with purees. Although thin liquids still pool in the  pyriform sinuses, she has some moments of swifter initiation (especially  with consecutive sips) and the liquids are better kept from her airway.  She did not have aspiration, and penetration was reduced in frequency to a  single time via straw (occurring a little more frequently with cup sips).  Penetration was trace in nature and largely remained above the vocal  folds, seeming to clear during subsequent swallows. Recommend starting a  diet of Dys 1 solids and thin liquids via straw with assistance during  meals.   Procedures:   Nasal/Sinus Endoscopy  Date/Time: 12/25/2023 8:30 AM  Performed by: Gerard Jenkins Shope, DO Authorized by: Gerard Jenkins Shope, DO  Local anesthesia used: yes  Anesthesia: Local anesthesia used: yes Local Anesthetic: topical anesthetic  Sedation: Patient sedated: no  Comments: Procedure Note - Nasal Endoscopy   Risks/benefits and possible complications of this procedure were discussed in detail and the patient understood and agreed to proceed.  The nose was sprayed with oxymetazoline  and 4% lidocaine . With the patient in the upright position, the flexible endscope was inserted into the nasal passage bilaterally.  Where visible, nasal secretions and mucosal crusting were removed with suction. The overall appearance of the nasal cavity and paranasal sinuses were noted and the findings are described below.  Findings: Mild septal deviation, clear rhinorrhea and allergic edema noted, no evidence of mucopurulent drainage, nasal polyps or focal obstruction   The patient tolerated the procedure without difficulty.      Laryngoscopy  Date/Time: 12/25/2023 8:30 AM  Performed by: Gerard Jenkins Shope, DO Authorized by: Gerard Jenkins Shope, DO  Local anesthesia used: yes  Anesthesia: Local anesthesia used: yes Local Anesthetic: topical anesthetic  Sedation: Patient sedated: no  Comments: Flexible Laryngoscopy Procedure Note  Indications: Dysphagia  Risks, benefits and clinical relevance of the study were discussed. The patient understands and agrees to proceed.  Procedure: After adequate topical anesthetic was applied, 4 mm flexible laryngoscope was passed through the nasal cavity without difficulty. Flexible laryngoscopy shows patent anterior nasal cavity with minimal crusting, no discharge or infection.  Normal base of tongue and supraglottis No vocal cord nodule, mass, polyp or tumor.  Unable to assess vocal cord mobility as patient did not respond to commands to phonate.  Hypopharynx normal without mass, pooling of secretions or aspiration.  Patient tolerated procedure without complication  or difficulty.        Impression & Plans:  Genasis Zingale is a 58 y.o. female with history of stroke in 2022 with secondary left-sided weakness, dysphagia and respiratory compromise.  Patient was initially trach and PEG dependent, but was subsequently decannulated.  She continues to be dependent on PEG for primary nutrition, but does take pured foods by mouth with family members.  [name] brings her to ENT today due to concerns mouth breathing and nasal obstruction.  Nasal endoscopy and laryngoscopy performed today with findings as above, nasal cavities are widely patent with mild allergic edema and clear rhinorrhea noted.  No evidence of focal obstruction, nasal polyps or ongoing infection.  Larynx appears grossly normal, however true vocal fold mobility was unable to be assessed as patient was not able to comply with instructions to phonate during fiberoptic examination.  Given ongoing symptoms of dysphagia, I recommended repeat assessment with SLP.  Patient may benefit from use of an intranasal steroid spray such as Flonase , however I believe her mouth breathing may be habitual at this time.  I did recommend use of ceruminolytic's to the right ear such as hydrogen peroxide, Dermotic or mineral oil--Recommend application of 4 drops, twice daily for the next 3 weeks.  Counseled patient's mother that patient could return to our office for cerumen management in 2-3 weeks or with her PCP.  Follow-up with ENT as needed.  Meghan Jenkins Shope, DO Otolaryngology         [1] History reviewed. No pertinent past medical history. [2] History reviewed. No pertinent surgical history. [3]  Current Outpatient Medications:  .  acetaminophen  (TYLENOL ) 500 mg tablet, Take 500 mg by mouth every 6 (six) hours as needed for mild pain (1-3)., Disp: , Rfl:  .  amLODIPine  (NORVASC ) 10 mg tablet, , Disp: , Rfl:  .  aspirin  81 mg chewable tablet, Chew 81 mg daily., Disp: , Rfl:  .  atorvastatin   (LIPITOR) 20 mg tablet, 20 mg., Disp: , Rfl:  .  baclofen  (LIORESAL ) 10 mg tablet, 5 mg., Disp: , Rfl:  .  cholecalciferol (VITAMIN D3) 125 mcg (5,000 unit) capsule, Take 5,000 Units by mouth daily., Disp: , Rfl:  .  docusate sodium  (COLACE) 100 mg capsule, Take 100 mg by mouth 2 (two) times a day., Disp: , Rfl:  .  doxazosin  (CARDURA ) 2 mg tablet, 2 mg., Disp: , Rfl:  .  esomeprazole (NexIUM) 20 mg DR capsule, 20 mg., Disp: , Rfl:  .  ferrous sulfate (FEROSUL) 220 mg (44 mg iron)/5 mL elix elixir, Take 44 mg of iron by mouth daily., Disp: , Rfl:  .  fluticasone  propionate (FLONASE ) 50 mcg/spray nasal spray, Administer 1 spray into each nostril daily., Disp: , Rfl:

## 2024-02-15 ENCOUNTER — Emergency Department (HOSPITAL_COMMUNITY)
Admission: EM | Admit: 2024-02-15 | Discharge: 2024-02-15 | Disposition: A | Discharge status: Home / Self Care | Source: Skilled Nursing Facility | Attending: Emergency Medicine | Admitting: Emergency Medicine

## 2024-02-15 ENCOUNTER — Encounter (HOSPITAL_COMMUNITY): Payer: Self-pay | Admitting: Emergency Medicine

## 2024-02-15 ENCOUNTER — Other Ambulatory Visit: Payer: Self-pay

## 2024-02-15 ENCOUNTER — Emergency Department (HOSPITAL_COMMUNITY)

## 2024-02-15 DIAGNOSIS — K9422 Gastrostomy infection: Secondary | ICD-10-CM | POA: Insufficient documentation

## 2024-02-15 DIAGNOSIS — Z7982 Long term (current) use of aspirin: Secondary | ICD-10-CM | POA: Insufficient documentation

## 2024-02-15 DIAGNOSIS — Z931 Gastrostomy status: Secondary | ICD-10-CM

## 2024-02-15 MED ORDER — IOHEXOL 300 MG/ML  SOLN
30.0000 mL | Freq: Once | INTRAMUSCULAR | Status: AC | PRN
  Administered 2024-02-15: 30 mL

## 2024-02-15 NOTE — ED Provider Notes (Addendum)
 [place] EMERGENCY DEPARTMENT AT [facility] Provider Note   CSN: [id number] Arrival date & time: 02/15/24  9255     Patient presents with: G Tube Replacement   Charlene [name] is a 58 y.o. female with past medical history of CVA, nonverbal with G-tube presents to emergency room with problem with G-tube.  According to the facility this morning when staff went to check on the patient she had pulled her G-tube out.  Staff were able to replace the G-tube with a 16 [place] to keep tract open in the meantime.  Patient is nonverbal at baseline, acting at baseline.  Denies other symptoms or complaints. From [facility].    HPI     Prior to Admission medications   Medication Sig Start Date End Date Taking? Authorizing Provider  acetaminophen  (TYLENOL ) 650 MG suppository Place 650 mg rectally every 6 (six) hours as needed (temperature 100 or above).    [provider]  amLODipine  (NORVASC ) 10 MG tablet Place 1 tablet (10 mg total) into feeding tube daily. Patient taking differently: Place 10 mg into feeding tube in the morning. 04/16/21   Alto Isaiah CROME, NP  amoxicillin  (AMOXIL ) 500 MG capsule Take 500 mg by mouth 3 (three) times daily.    [provider]  aspirin  81 MG chewable tablet Place 81 mg into feeding tube in the morning.    [provider]  atorvastatin  (LIPITOR) 40 MG tablet Place 1 tablet (40 mg total) into feeding tube daily. Patient taking differently: Place 40 mg into feeding tube at bedtime. 04/16/21   Alto Isaiah CROME, NP  baclofen  (OZOBAX ) 1 mg/mL SOLN oral solution Place 5 mLs (5 mg total) into feeding tube at bedtime. 02/27/22   Lue Elsie BROCKS, MD  Baclofen  5 MG TABS Take by mouth daily. 04/10/22   [provider]  bisacodyl  (FLEET) 10 MG/30ML ENEM Place 10 mg rectally once.    [provider]  Chlorhexidine  Gluconate 20 % SOLN Take 1 application  by mouth See admin instructions. Apply 1 application buccally at  bedtime for oral care for 3 weeks (starting 02/17/22) - apply with oral care stick to tongue/mucosa    [provider]  Cholecalciferol (VITAMIN D -3) 5000 UNIT/ML LIQD Give 5,000 mLs by tube every Monday.    [provider]  diclofenac  Sodium (VOLTAREN ) 1 % GEL Apply 4 g topically 4 (four) times daily. Patient taking differently: Apply 4 g topically 2 (two) times daily. To right knee 04/15/21   Alto Isaiah CROME, NP  doxazosin  (CARDURA ) 2 MG tablet Place 1 tablet (2 mg total) into feeding tube daily. Patient taking differently: Place 2 mg into feeding tube in the morning. 04/16/21   Alto Isaiah CROME, NP  esomeprazole (NEXIUM) 20 MG packet Take 20 mg by mouth in the morning. Via tube    [provider]  ferrous sulfate 325 (65 FE) MG tablet Take 325 mg by mouth in the morning. Via tube    [provider]  fluticasone  (FLONASE ) 50 MCG/ACT nasal spray Place 2 sprays into both nostrils daily. Patient taking differently: Place 2 sprays into both nostrils in the morning. 04/16/21   Alto Isaiah CROME, NP  levETIRAcetam  (KEPPRA ) 100 MG/ML solution Take 5 mLs (500 mg total) by mouth 2 (two) times daily. 02/27/22   Lue Elsie BROCKS, MD  levETIRAcetam  (KEPPRA ) 100 MG/ML solution Take 100 mg by mouth daily.    [provider]  nystatin (MYCOSTATIN) 100000 UNIT/ML suspension Use as directed 5  mLs in the mouth or throat See admin instructions. Apply 5 ml buccally two times daily for 3 weeks (starting 02/17/22) - apply thin layer with oral care stick to tongue    [provider]  polyethylene glycol (MIRALAX  / GLYCOLAX ) 17 g packet Place 17 g into feeding tube daily as needed for moderate constipation. 04/15/21   Alto Isaiah CROME, NP  potassium chloride  20 MEQ/15ML (10%) SOLN Take 20 mEq by mouth daily. 03/01/22   [provider]  metoprolol  tartrate (LOPRESSOR ) 25 MG tablet Take 50 mg by mouth daily. 03/10/14 06/03/20  Advani, Deepak, MD    Allergies: Patient  has no known allergies.    Review of Systems  Updated Vital Signs BP 120/81   Pulse (!) 101   Temp 98.1 F (36.7 C) (Oral)   Resp 18   LMP 03/03/2014   SpO2 99%   Physical Exam Vitals and nursing note reviewed.  Constitutional:      General: She is not in acute distress.    Appearance: She is not toxic-appearing.  HENT:     Head: Normocephalic and atraumatic.  Eyes:     General: No scleral icterus.    Conjunctiva/sclera: Conjunctivae normal.  Cardiovascular:     Rate and Rhythm: Normal rate and regular rhythm.     Pulses: Normal pulses.     Heart sounds: Normal heart sounds.  Pulmonary:     Effort: Pulmonary effort is normal. No respiratory distress.     Breath sounds: Normal breath sounds.  Abdominal:     General: Abdomen is flat. Bowel sounds are normal.     Palpations: Abdomen is soft.     Tenderness: There is no abdominal tenderness.  Skin:    General: Skin is warm and dry.     Findings: No lesion.     Comments: G tube tract without directed irritation, erythema.  No purulent drainage.   Neurological:     General: No focal deficit present.     Mental Status: She is alert. Mental status is at baseline.     (all labs ordered are listed, but only abnormal results are displayed) Labs Reviewed - No data to display  EKG: None  Radiology: No results found.   .Gastrostomy tube replacement  Date/Time: 02/15/2024 11:17 AM  Performed by: Shermon Warren SAILOR, PA-C Authorized by: Shermon Warren SAILOR, PA-C  Local anesthesia used: no  Anesthesia: Local anesthesia used: no  Sedation: Patient sedated: no  Patient tolerance: patient tolerated the procedure well with no immediate complications Comments: Replaced at bedside without difficulty. Placement confirmed on x-ray.       Medications Ordered in the ED - No data to display                                  Medical Decision Making Amount and/or Complexity of Data Reviewed Radiology:  ordered.  Risk Prescription drug management.   This patient presents to the ED for concern of G tube problem, this involves an extensive number of treatment options, and is a complaint that carries with it a high risk of complications and morbidity.  The differential diagnosis includes G-tube problem, G-tube removed, malfunction.   Problem List / ED Course / Critical interventions / Medication management  Patient presents from [facility] after dislodging G-tube.  He was found down this morning.  Was replaced here in the ER without difficulty.  X-ray confirms placement.  Vital  signs are stable.  No fever.  Site clean without surrounding signs of infection or purulent drainage.  Feel appropriate for discharge back to facility. I have reviewed the patients home medicines and have made adjustments as needed      Final diagnoses:  S/P percutaneous endoscopic gastrostomy (PEG) tube placement [facility])    ED Discharge Orders     None          Shermon Warren SAILOR, PA-C 02/15/24 1043    Isaah Furry, Warren SAILOR, PA-C 02/15/24 1118    Randol Simmonds, MD 02/19/24 1151

## 2024-02-15 NOTE — ED Triage Notes (Signed)
 BIB [facility].  When staff went to see patient this morning her G tube was out.  Staff placed a 68F in to keep open. Pt is nonverbal.  GCS 8.   VSS

## 2024-02-22 NOTE — Progress Notes (Signed)
 Otolaryngology Clinic Note  HPI:    Follow-up (Pt presents for ear recheck )    Charlene [name] is a 58 y.o. female who presents for cerumen management following use of ceruminolytic's since last visit.  PMH/Meds/All/SocHx/FamHx/ROS:   Medical History[1]  Surgical History[2]  No family history of bleeding disorders, wound healing problems or difficulty with anesthesia.   Social History   Socioeconomic History  . Marital status: Married    Spouse name: Not on file  . Number of children: Not on file  . Years of education: Not on file  . Highest education level: Not on file  Occupational History  . Not on file  Tobacco Use  . Smoking status: Unknown  . Smokeless tobacco: Not on file  Substance and Sexual Activity  . Alcohol use: Never  . Drug use: Not on file  . Sexual activity: Not on file  Other Topics Concern  . Not on file  Social History Narrative  . Not on file   Social Drivers of Health   Food Insecurity: Not on file  Transportation Needs: Not on file  Safety: Not on file  Living Situation: Not on file    Current Medications[3]  A complete ROS was performed with pertinent positives/negatives noted in the HPI. The remainder of the ROS are negative.    Physical Exam:    Temp 97.7 F (36.5 C) (Temporal)   Ht 1.626 m (5' 4)   Wt 59.9 kg (132 lb)   BMI 22.66 kg/m    General: No acute distress.   Head/Face: Normocephalic, atraumatic. No scars or lesions. No sinus tenderness. Facial nerve intact and equal bilaterally.   Ears:   Right: Pinna and external meatus normal, normal ear canal skin and caliber without excessive cerumen or drainage. Tympanic membrane intact without effusion or infection.    Left: Pinna and external meatus normal, normal ear canal skin and caliber with impacted cerumen obstructing view of tympanic membrane.  Following cerumen removal, tympanic membrane intact without effusion or infection.    Independent Review of Additional  Tests or Records:  Documentation from previous ED visit reviewed  Procedures:   Binocular Microscopy  Date/Time: 02/22/2024 10:45 AM  Performed by: Gerard Jenkins Shope, DO Authorized by: Gerard Jenkins Shope, DO    Ear Cerumen Removal  Date/Time: 02/22/2024 10:45 AM  Performed by: Gerard Jenkins Shope, DO Authorized by: Gerard Jenkins Shope, DO   Procedure Details:  Impacted cerumen: Yes Location details: right  Post Procedure Details:  Procedure findings: complete impaction removal Patient tolerance of procedure: patient tolerated the procedure without difficulty Comments: CERUMEN REMOVAL The risks and benefits of this procedure have been thoroughly discussed with the patient/parent.  The most commons risks outlined included but were not limited to: injury of the ear canal or tympanic membrane.  The patient/parent was further informed that there are other less common risks.  The patient/parent  was given the opportunity to ask questions and all such questions were answered to the patient/parent's satisfaction.  Patient/parent  acknowledged the risks and has agreed to proceed.  Procedure: Patient was laid supine and using the microscope, cerumen was removed from right  external auditory canal(s) using suction and various instrumentation including wax currettes and suctions.  Tolerance: good Unplanned interventions:  none Unplanned events:  no complications       Impression & Plans:  Charlene [name] is a 58 y.o. female with right cerumen impaction.  This was removed using the microscope without difficulty.  Following removal, tympanic  membrane appears healthy with well-pneumatized middle ear space.  Cerumen management strategies were reviewed with patient's caregivers, to include application of mineral oil or hydrogen peroxide to bilateral ears on a routine basis and avoidance of Q-tips.  Advise follow-up with ENT as needed.  Meghan Jenkins Shope, DO Otolaryngology          [1] History reviewed. No pertinent past medical history. [2] History reviewed. No pertinent surgical history. [3]  Current Outpatient Medications:  .  acetaminophen  (TYLENOL ) 500 mg tablet, Take 500 mg by mouth every 6 (six) hours as needed for mild pain (1-3)., Disp: , Rfl:  .  amLODIPine  (NORVASC ) 10 mg tablet, , Disp: , Rfl:  .  aspirin  81 mg chewable tablet, Chew 81 mg daily., Disp: , Rfl:  .  atorvastatin  (LIPITOR) 20 mg tablet, 20 mg., Disp: , Rfl:  .  baclofen  (LIORESAL ) 10 mg tablet, 5 mg., Disp: , Rfl:  .  cholecalciferol (VITAMIN D3) 125 mcg (5,000 unit) capsule, Take 5,000 Units by mouth daily., Disp: , Rfl:  .  docusate sodium  (COLACE) 100 mg capsule, Take 100 mg by mouth 2 (two) times a day., Disp: , Rfl:  .  doxazosin  (CARDURA ) 2 mg tablet, 2 mg., Disp: , Rfl:  .  esomeprazole (NexIUM) 20 mg DR capsule, 20 mg., Disp: , Rfl:  .  ferrous sulfate (FEROSUL) 220 mg (44 mg iron)/5 mL elix elixir, Take 44 mg of iron by mouth daily., Disp: , Rfl:  .  fluticasone  propionate (FLONASE ) 50 mcg/spray nasal spray, Administer 1 spray into each nostril daily., Disp: , Rfl:

## 2024-03-17 ENCOUNTER — Emergency Department (HOSPITAL_COMMUNITY)

## 2024-03-17 ENCOUNTER — Encounter (HOSPITAL_COMMUNITY): Payer: Self-pay

## 2024-03-17 ENCOUNTER — Emergency Department (HOSPITAL_COMMUNITY)
Admission: EM | Admit: 2024-03-17 | Discharge: 2024-03-17 | Disposition: A | Discharge status: Home / Self Care | Attending: Emergency Medicine | Admitting: Emergency Medicine

## 2024-03-17 DIAGNOSIS — K9423 Gastrostomy malfunction: Secondary | ICD-10-CM | POA: Diagnosis present

## 2024-03-17 DIAGNOSIS — Z7982 Long term (current) use of aspirin: Secondary | ICD-10-CM | POA: Diagnosis not present

## 2024-03-17 DIAGNOSIS — T85528A Displacement of other gastrointestinal prosthetic devices, implants and grafts, initial encounter: Secondary | ICD-10-CM

## 2024-03-17 NOTE — ED Notes (Signed)
 CALLED [facility] FOR PT  PICK

## 2024-03-17 NOTE — ED Notes (Signed)
 Gustav with [facility] was informed by this RN that patient will be en route via [facility] shortly-Monique,RN

## 2024-03-17 NOTE — Discharge Instructions (Signed)
 Follow-up with your doctor as needed.

## 2024-03-17 NOTE — ED Notes (Signed)
 2x RN cleaned patient up from urine; There is not evidence of foley placement at this time time on assessment; Spouse states patient has never had foley to his knowledge but she use to have Perwick's placed until she had continued UTIs and usage was stopped; Pt has mild cracking sounds but was stated by spouse the gargling sound decrease as long as patient is positioned st 45 degrees or higher; Pt placed in upright position and gargling has decreased; RN will continue to monitor while waiting for [facility] for transport back to [facility]; Spouse remain at [facility]

## 2024-03-17 NOTE — ED Notes (Signed)
 Report given to christopher RN in [place].

## 2024-03-17 NOTE — ED Provider Notes (Signed)
 [place] EMERGENCY DEPARTMENT AT [facility] Provider Note   CSN: [id number] Arrival date & time: 03/17/24  1457     Patient presents with: G-tube out    Charlene [name] is a 58 y.o. female.   58 year old female history of CVA who is nonverbal presents after G-tube is been dislodged.  A Foley catheter was placed and the site prior to transport.  No reported infection was noted per EMS team.  EMS called and patient transported here       Prior to Admission medications   Medication Sig Start Date End Date Taking? Authorizing Provider  acetaminophen  (TYLENOL ) 650 MG suppository Place 650 mg rectally every 6 (six) hours as needed (temperature 100 or above).    [provider]  amLODipine  (NORVASC ) 10 MG tablet Place 1 tablet (10 mg total) into feeding tube daily. Patient taking differently: Place 10 mg into feeding tube in the morning. 04/16/21   Alto Isaiah CROME, NP  amoxicillin  (AMOXIL ) 500 MG capsule Take 500 mg by mouth 3 (three) times daily.    [provider]  aspirin  81 MG chewable tablet Place 81 mg into feeding tube in the morning.    [provider]  atorvastatin  (LIPITOR) 40 MG tablet Place 1 tablet (40 mg total) into feeding tube daily. Patient taking differently: Place 40 mg into feeding tube at bedtime. 04/16/21   Alto Isaiah CROME, NP  baclofen  (OZOBAX ) 1 mg/mL SOLN oral solution Place 5 mLs (5 mg total) into feeding tube at bedtime. 02/27/22   Lue Elsie BROCKS, MD  Baclofen  5 MG TABS Take by mouth daily. 04/10/22   [provider]  bisacodyl  (FLEET) 10 MG/30ML ENEM Place 10 mg rectally once.    [provider]  Chlorhexidine  Gluconate 20 % SOLN Take 1 application  by mouth See admin instructions. Apply 1 application buccally at bedtime for oral care for 3 weeks (starting 02/17/22) - apply with oral care stick to tongue/mucosa    [provider]  Cholecalciferol (VITAMIN D -3) 5000 UNIT/ML LIQD Give 5,000 mLs  by tube every Monday.    [provider]  diclofenac  Sodium (VOLTAREN ) 1 % GEL Apply 4 g topically 4 (four) times daily. Patient taking differently: Apply 4 g topically 2 (two) times daily. To right knee 04/15/21   Alto Isaiah CROME, NP  doxazosin  (CARDURA ) 2 MG tablet Place 1 tablet (2 mg total) into feeding tube daily. Patient taking differently: Place 2 mg into feeding tube in the morning. 04/16/21   Alto Isaiah CROME, NP  esomeprazole (NEXIUM) 20 MG packet Take 20 mg by mouth in the morning. Via tube    [provider]  ferrous sulfate 325 (65 FE) MG tablet Take 325 mg by mouth in the morning. Via tube    [provider]  fluticasone  (FLONASE ) 50 MCG/ACT nasal spray Place 2 sprays into both nostrils daily. Patient taking differently: Place 2 sprays into both nostrils in the morning. 04/16/21   Alto Isaiah CROME, NP  levETIRAcetam  (KEPPRA ) 100 MG/ML solution Take 5 mLs (500 mg total) by mouth 2 (two) times daily. 02/27/22   Lue Elsie BROCKS, MD  levETIRAcetam  (KEPPRA ) 100 MG/ML solution Take 100 mg by mouth daily.    [provider]  nystatin (MYCOSTATIN) 100000 UNIT/ML suspension Use as directed 5 mLs in the mouth or throat See admin instructions. Apply 5 ml buccally two times daily for 3 weeks (starting 02/17/22) - apply thin layer with oral care stick to tongue  [provider]  polyethylene glycol (MIRALAX  / GLYCOLAX ) 17 g packet Place 17 g into feeding tube daily as needed for moderate constipation. 04/15/21   Alto Isaiah CROME, NP  potassium chloride  20 MEQ/15ML (10%) SOLN Take 20 mEq by mouth daily. 03/01/22   [provider]  metoprolol  tartrate (LOPRESSOR ) 25 MG tablet Take 50 mg by mouth daily. 03/10/14 06/03/20  Advani, Deepak, MD    Allergies: Patient has no known allergies.    Review of Systems  Unable to perform ROS: Patient nonverbal    Updated Vital Signs BP 128/69   Pulse 87   Temp (!) 97.1 F (36.2 C) (Temporal)   Resp 16    LMP 03/03/2014   SpO2 100%   Physical Exam Vitals and nursing note reviewed.  Constitutional:      General: She is not in acute distress.    Appearance: Normal appearance. She is well-developed. She is not toxic-appearing.  HENT:     Head: Normocephalic and atraumatic.  Eyes:     General: Lids are normal.     Conjunctiva/sclera: Conjunctivae normal.     Pupils: Pupils are equal, round, and reactive to light.  Neck:     Thyroid: No thyroid mass.     Trachea: No tracheal deviation.  Cardiovascular:     Rate and Rhythm: Normal rate and regular rhythm.     Heart sounds: Normal heart sounds. No murmur heard.    No gallop.  Pulmonary:     Effort: Pulmonary effort is normal. No respiratory distress.     Breath sounds: Normal breath sounds. No stridor. No decreased breath sounds, wheezing, rhonchi or rales.  Abdominal:     General: There is no distension.     Palpations: Abdomen is soft.     Tenderness: There is no abdominal tenderness. There is no rebound.   Musculoskeletal:        General: No tenderness. Normal range of motion.     Cervical back: Normal range of motion and neck supple.  Skin:    General: Skin is warm and dry.     Findings: No abrasion or rash.  Neurological:     Mental Status: She is alert. Mental status is at baseline.     Cranial Nerves: No cranial nerve deficit.     (all labs ordered are listed, but only abnormal results are displayed) Labs Reviewed - No data to display  EKG: None  Radiology: No results found.   .Gastrostomy tube replacement  Date/Time: 03/17/2024 6:32 PM  Performed by: Dasie Faden, MD Authorized by: Dasie Faden, MD  Consent: Verbal consent obtained Risks and benefits: risks, benefits and alternatives were discussed Time out: Immediately prior to procedure a time out was called to verify the correct patient, procedure, equipment, support staff and site/side marked as required. Preparation: Patient was prepped and  draped in the usual sterile fashion. Local anesthesia used: no  Anesthesia: Local anesthesia used: no  Sedation: Patient sedated: no  Patient tolerance: patient tolerated the procedure well with no immediate complications     Patient with postprocedure x-ray shows good placement of the G-tube.  Will be discharged back to facility Medications Ordered in the ED - No data to display                                  Medical Decision Making Amount and/or Complexity of Data Reviewed Radiology: ordered.  Final diagnoses:  None    ED Discharge Orders     None          Dasie Faden, MD 03/17/24 [phone]

## 2024-03-17 NOTE — ED Triage Notes (Addendum)
 Pt BIB [facility] from [facility] nursing facility d/t his G- tube out. This is an ongoing issue for the pt. Hx on stroke. Pt is contracted on L arm and both legs. Foley in place.  Pt is non-verbal.

## 2024-04-07 ENCOUNTER — Emergency Department (HOSPITAL_COMMUNITY)
Admission: EM | Admit: 2024-04-07 | Discharge: 2024-04-08 | Disposition: A | Discharge status: Skilled Nursing Facility | Attending: Emergency Medicine | Admitting: Emergency Medicine

## 2024-04-07 ENCOUNTER — Other Ambulatory Visit: Payer: Self-pay

## 2024-04-07 ENCOUNTER — Encounter (HOSPITAL_COMMUNITY): Payer: Self-pay | Admitting: Emergency Medicine

## 2024-04-07 DIAGNOSIS — Z7982 Long term (current) use of aspirin: Secondary | ICD-10-CM | POA: Diagnosis not present

## 2024-04-07 DIAGNOSIS — K9420 Gastrostomy complication, unspecified: Secondary | ICD-10-CM

## 2024-04-07 DIAGNOSIS — R000 Tachycardia, unspecified: Secondary | ICD-10-CM | POA: Diagnosis not present

## 2024-04-07 DIAGNOSIS — K9423 Gastrostomy malfunction: Secondary | ICD-10-CM | POA: Insufficient documentation

## 2024-04-07 NOTE — ED Provider Notes (Signed)
 [place] EMERGENCY DEPARTMENT AT [facility] Provider Note   CSN: [id number] Arrival date & time: 04/07/24  2321     Patient presents with: G- tube Removed    Charlene [name] is a 58 y.o. female.   Patient is nonverbal.  There is no report from the facility.  [employer] [facility] we have is not answering the phone to give me report.  All I know is the patient is post have a G-tube and there is not one.   On review of her records it appears she has a 20 [place] balloon G-tube for upcoming scheduled IR appointment.        Prior to Admission medications   Medication Sig Start Date End Date Taking? Authorizing Provider  acetaminophen  (TYLENOL ) 650 MG suppository Place 650 mg rectally every 6 (six) hours as needed (temperature 100 or above).    [provider]  amLODipine  (NORVASC ) 10 MG tablet Place 1 tablet (10 mg total) into feeding tube daily. Patient taking differently: Place 10 mg into feeding tube in the morning. 04/16/21   Alto Isaiah CROME, NP  amoxicillin  (AMOXIL ) 500 MG capsule Take 500 mg by mouth 3 (three) times daily.    [provider]  aspirin  81 MG chewable tablet Place 81 mg into feeding tube in the morning.    [provider]  atorvastatin  (LIPITOR) 40 MG tablet Place 1 tablet (40 mg total) into feeding tube daily. Patient taking differently: Place 40 mg into feeding tube at bedtime. 04/16/21   Alto Isaiah CROME, NP  baclofen  (OZOBAX ) 1 mg/mL SOLN oral solution Place 5 mLs (5 mg total) into feeding tube at bedtime. 02/27/22   Lue Elsie BROCKS, MD  Baclofen  5 MG TABS Take by mouth daily. 04/10/22   [provider]  bisacodyl  (FLEET) 10 MG/30ML ENEM Place 10 mg rectally once.    [provider]  Chlorhexidine  Gluconate 20 % SOLN Take 1 application  by mouth See admin instructions. Apply 1 application buccally at bedtime for oral care for 3 weeks (starting 02/17/22) - apply with oral care stick to tongue/mucosa     [provider]  Cholecalciferol (VITAMIN D -3) 5000 UNIT/ML LIQD Give 5,000 mLs by tube every Monday.    [provider]  diclofenac  Sodium (VOLTAREN ) 1 % GEL Apply 4 g topically 4 (four) times daily. Patient taking differently: Apply 4 g topically 2 (two) times daily. To right knee 04/15/21   Alto Isaiah CROME, NP  doxazosin  (CARDURA ) 2 MG tablet Place 1 tablet (2 mg total) into feeding tube daily. Patient taking differently: Place 2 mg into feeding tube in the morning. 04/16/21   Alto Isaiah CROME, NP  esomeprazole (NEXIUM) 20 MG packet Take 20 mg by mouth in the morning. Via tube    [provider]  ferrous sulfate 325 (65 FE) MG tablet Take 325 mg by mouth in the morning. Via tube    [provider]  fluticasone  (FLONASE ) 50 MCG/ACT nasal spray Place 2 sprays into both nostrils daily. Patient taking differently: Place 2 sprays into both nostrils in the morning. 04/16/21   Alto Isaiah CROME, NP  levETIRAcetam  (KEPPRA ) 100 MG/ML solution Take 5 mLs (500 mg total) by mouth 2 (two) times daily. 02/27/22   Lue Elsie BROCKS, MD  levETIRAcetam  (KEPPRA ) 100 MG/ML solution Take 100 mg by mouth daily.    [provider]  nystatin (MYCOSTATIN) 100000 UNIT/ML suspension Use as directed 5 mLs in the mouth or throat See admin instructions.  Apply 5 ml buccally two times daily for 3 weeks (starting 02/17/22) - apply thin layer with oral care stick to tongue    [provider]  polyethylene glycol (MIRALAX  / GLYCOLAX ) 17 g packet Place 17 g into feeding tube daily as needed for moderate constipation. 04/15/21   Alto Isaiah CROME, NP  potassium chloride  20 MEQ/15ML (10%) SOLN Take 20 mEq by mouth daily. 03/01/22   [provider]  metoprolol  tartrate (LOPRESSOR ) 25 MG tablet Take 50 mg by mouth daily. 03/10/14 06/03/20  Advani, Deepak, MD    Allergies: Patient has no known allergies.    Review of Systems  Updated Vital Signs BP 128/84 (BP Location: Right  Arm)   Pulse (!) 102   Temp 98.6 F (37 C) (Oral)   Resp 16   Ht 5' 5 (1.651 m)   Wt 48 kg   LMP 03/03/2014   SpO2 100%   BMI 17.61 kg/m   Physical Exam Vitals and nursing note reviewed.  HENT:     Head: Normocephalic.  Cardiovascular:     Rate and Rhythm: Tachycardia present.  Pulmonary:     Effort: Pulmonary effort is normal.  Abdominal:     Comments: Slightly protuberant.  No leakage from G-tube tract  Musculoskeletal:     Comments: Contracted  Neurological:     Mental Status: She is alert. Mental status is at baseline.     Comments: Nonverbal     (all labs ordered are listed, but only abnormal results are displayed) Labs Reviewed - No data to display  EKG: None  Radiology: No results found.   Procedures   Medications Ordered in the ED - No data to display                                  Medical Decision Making Risk Decision regarding hospitalization.   Attempted to place 20 french G tube and would not penetrate at all.   Attempted to place a 14 french catheter to hold open and would go about one inch but no further, will need to consult IR for placement as I don't want to cause a false tract or any other complications from using too much force. Suspect this G tube has been out for awhile.   Discussed with Dr. Jenna with IR, will do it this AM, order placed.      Final diagnoses:  Complication of gastrostomy tube [facility])    ED Discharge Orders     None          Irmalee Riemenschneider, Selinda, MD 04/08/24 [phone]

## 2024-04-07 NOTE — ED Provider Notes (Incomplete)
 [place] EMERGENCY DEPARTMENT AT [facility] Provider Note   CSN: [id number] Arrival date & time: 04/07/24  2321     Patient presents with: G- tube Removed    Charlene [name] is a 58 y.o. female.  {Add pertinent medical, surgical, social history, OB history to YEP:67052} Patient is nonverbal.  There is no report from the facility.  [employer] [facility] we have is not answering the phone to give me report.  All I know is the patient is post have a G-tube and there is not one.   On review of her records it appears she has a 20 [place] balloon G-tube for upcoming scheduled IR appointment.        Prior to Admission medications   Medication Sig Start Date End Date Taking? Authorizing Provider  acetaminophen  (TYLENOL ) 650 MG suppository Place 650 mg rectally every 6 (six) hours as needed (temperature 100 or above).    [provider]  amLODipine  (NORVASC ) 10 MG tablet Place 1 tablet (10 mg total) into feeding tube daily. Patient taking differently: Place 10 mg into feeding tube in the morning. 04/16/21   Alto Isaiah CROME, NP  amoxicillin  (AMOXIL ) 500 MG capsule Take 500 mg by mouth 3 (three) times daily.    [provider]  aspirin  81 MG chewable tablet Place 81 mg into feeding tube in the morning.    [provider]  atorvastatin  (LIPITOR) 40 MG tablet Place 1 tablet (40 mg total) into feeding tube daily. Patient taking differently: Place 40 mg into feeding tube at bedtime. 04/16/21   Alto Isaiah CROME, NP  baclofen  (OZOBAX ) 1 mg/mL SOLN oral solution Place 5 mLs (5 mg total) into feeding tube at bedtime. 02/27/22   Lue Elsie BROCKS, MD  Baclofen  5 MG TABS Take by mouth daily. 04/10/22   [provider]  bisacodyl  (FLEET) 10 MG/30ML ENEM Place 10 mg rectally once.    [provider]  Chlorhexidine  Gluconate 20 % SOLN Take 1 application  by mouth See admin instructions. Apply 1 application buccally at bedtime for oral care for 3 weeks  (starting 02/17/22) - apply with oral care stick to tongue/mucosa    [provider]  Cholecalciferol (VITAMIN D -3) 5000 UNIT/ML LIQD Give 5,000 mLs by tube every Monday.    [provider]  diclofenac  Sodium (VOLTAREN ) 1 % GEL Apply 4 g topically 4 (four) times daily. Patient taking differently: Apply 4 g topically 2 (two) times daily. To right knee 04/15/21   Alto Isaiah CROME, NP  doxazosin  (CARDURA ) 2 MG tablet Place 1 tablet (2 mg total) into feeding tube daily. Patient taking differently: Place 2 mg into feeding tube in the morning. 04/16/21   Alto Isaiah CROME, NP  esomeprazole (NEXIUM) 20 MG packet Take 20 mg by mouth in the morning. Via tube    [provider]  ferrous sulfate 325 (65 FE) MG tablet Take 325 mg by mouth in the morning. Via tube    [provider]  fluticasone  (FLONASE ) 50 MCG/ACT nasal spray Place 2 sprays into both nostrils daily. Patient taking differently: Place 2 sprays into both nostrils in the morning. 04/16/21   Alto Isaiah CROME, NP  levETIRAcetam  (KEPPRA ) 100 MG/ML solution Take 5 mLs (500 mg total) by mouth 2 (two) times daily. 02/27/22   Lue Elsie BROCKS, MD  levETIRAcetam  (KEPPRA ) 100 MG/ML solution Take 100 mg by mouth daily.    [provider]  nystatin (MYCOSTATIN) 100000 UNIT/ML suspension Use as directed 5  mLs in the mouth or throat See admin instructions. Apply 5 ml buccally two times daily for 3 weeks (starting 02/17/22) - apply thin layer with oral care stick to tongue    [provider]  polyethylene glycol (MIRALAX  / GLYCOLAX ) 17 g packet Place 17 g into feeding tube daily as needed for moderate constipation. 04/15/21   Alto Isaiah CROME, NP  potassium chloride  20 MEQ/15ML (10%) SOLN Take 20 mEq by mouth daily. 03/01/22   [provider]  metoprolol  tartrate (LOPRESSOR ) 25 MG tablet Take 50 mg by mouth daily. 03/10/14 06/03/20  Advani, Deepak, MD    Allergies: Patient has no known allergies.     Review of Systems  Updated Vital Signs BP 127/78 (BP Location: Left Arm)   Pulse (!) 102   Temp 97.8 F (36.6 C) (Oral)   Resp (!) 24   Ht 5' 5 (1.651 m)   Wt 48 kg   LMP 03/03/2014   SpO2 100%   BMI 17.61 kg/m   Physical Exam Vitals and nursing note reviewed.  HENT:     Head: Normocephalic.  Cardiovascular:     Rate and Rhythm: Tachycardia present.  Pulmonary:     Effort: Pulmonary effort is normal.  Abdominal:     Comments: Slightly protuberant.  No leakage from G-tube tract  Musculoskeletal:     Comments: Contracted  Neurological:     Mental Status: She is alert. Mental status is at baseline.     Comments: Nonverbal     (all labs ordered are listed, but only abnormal results are displayed) Labs Reviewed - No data to display  EKG: None  Radiology: No results found.  {Document cardiac monitor, telemetry assessment procedure when appropriate:32947} Procedures   Medications Ordered in the ED - No data to display    {Click here for ABCD2, HEART and other calculators REFRESH Note before signing:1}                              Medical Decision Making  ***  {Document critical care time when appropriate  Document review of labs and clinical decision tools ie CHADS2VASC2, etc  Document your independent review of radiology images and any outside records  Document your discussion with family members, caretakers and with consultants  Document social determinants of health affecting pt's care  Document your decision making why or why not admission, treatments were needed:32947:::1}   Final diagnoses:  None    ED Discharge Orders     None

## 2024-04-07 NOTE — ED Triage Notes (Signed)
 Patient from [facility] after patient removed G-tube. Patient nonverbal. VSS per EMS.

## 2024-04-08 ENCOUNTER — Emergency Department (HOSPITAL_COMMUNITY)

## 2024-04-08 DIAGNOSIS — K9423 Gastrostomy malfunction: Secondary | ICD-10-CM | POA: Diagnosis not present

## 2024-04-08 HISTORY — PX: IR REPLC GASTRO/COLONIC TUBE PERCUT W/FLUORO: IMG2333

## 2024-04-08 MED ORDER — IOHEXOL 300 MG/ML  SOLN
50.0000 mL | Freq: Once | INTRAMUSCULAR | Status: AC | PRN
  Administered 2024-04-08: 10 mL

## 2024-04-08 MED ORDER — LIDOCAINE VISCOUS HCL 2 % MT SOLN
OROMUCOSAL | Status: AC
  Filled 2024-04-08: qty 15

## 2024-04-08 NOTE — ED Notes (Signed)
 Called [facility] ETA 35 MINUTES

## 2024-04-08 NOTE — Discharge Instructions (Signed)
 The tube has been replaced

## 2024-04-08 NOTE — Procedures (Signed)
 Interventional Radiology Procedure Note  Procedure: Gastrostomy tube replacement under fluoro  Complications: None  Estimated Blood Loss: None  Findings: 16 Fr balloon retention gastrostomy tube successfully placed in tract and positioned in body of stomach. OK to use.  Marcey DASEN. [place]ver[place], M.D Pager:  [phone]

## 2024-04-08 NOTE — ED Provider Notes (Signed)
  Physical Exam  BP 124/83   Pulse (!) 110   Temp 98.2 F (36.8 C) (Oral)   Resp 18   Ht 5' 5 (1.651 m)   Wt 48 kg   LMP 03/03/2014   SpO2 100%   BMI 17.61 kg/m   Physical Exam  Procedures  Procedures  ED Course / MDM    Medical Decision Making  Back from tube placement.  Discharge home       Patsey Lot, MD 04/08/24 1052

## 2024-04-08 NOTE — Progress Notes (Signed)
 Attempt made to replace G tube at bedside, the track would not allow dilator bigger than 12 Fr.  12 Fr dilator left in place, asked ED to keep it in place until the G tube can be replaced in IR.    Little Bashore H Aliha Diedrich PA-C 04/08/2024 9:16 AM

## 2024-04-30 ENCOUNTER — Other Ambulatory Visit: Payer: Self-pay

## 2024-04-30 ENCOUNTER — Emergency Department (HOSPITAL_COMMUNITY)

## 2024-04-30 ENCOUNTER — Emergency Department (HOSPITAL_COMMUNITY)
Admission: EM | Admit: 2024-04-30 | Discharge: 2024-04-30 | Disposition: A | Discharge status: Home / Self Care | Attending: Emergency Medicine | Admitting: Emergency Medicine

## 2024-04-30 DIAGNOSIS — Z7982 Long term (current) use of aspirin: Secondary | ICD-10-CM | POA: Insufficient documentation

## 2024-04-30 DIAGNOSIS — K9423 Gastrostomy malfunction: Secondary | ICD-10-CM | POA: Diagnosis present

## 2024-04-30 MED ORDER — DIATRIZOATE MEGLUMINE & SODIUM 66-10 % PO SOLN
ORAL | Status: AC
  Filled 2024-04-30: qty 30

## 2024-04-30 MED ORDER — DIATRIZOATE MEGLUMINE & SODIUM 66-10 % PO SOLN
30.0000 mL | Freq: Once | ORAL | Status: AC
  Administered 2024-04-30: 30 mL
  Filled 2024-04-30: qty 30

## 2024-04-30 NOTE — ED Triage Notes (Signed)
 Patient arrives via [facility] for removed g-tube. Patient nonverbal at baseline. 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 Patient mentating at baseline per EMS.   Ems vitals BP 120/80 HR 90 18 RR 98 on room air

## 2024-04-30 NOTE — ED Notes (Signed)
 Secretary notified to call supply for 16Fr peg tube

## 2024-04-30 NOTE — Discharge Instructions (Addendum)
 Your PEG tube was replaced. Follow-up with your outpatient doctors to see if it needs to be upsized at a later date.

## 2024-04-30 NOTE — ED Notes (Addendum)
 Patient resting in bed in no acute distress. Provided with warm blanket, turned on TV. Vitals stable:  124/81 99% on RA 20RR 84HR

## 2024-04-30 NOTE — ED Provider Notes (Signed)
 [place] EMERGENCY DEPARTMENT AT [facility] Provider Note   CSN: [id number] Arrival date & time: 04/30/24  1212     Patient presents with: No chief complaint on file.   Charlene [name] is a 58 y.o. female.  Patient presents to the emergency department with G-tube complications.  Reportedly had G-tube pulled and removed in somewhere around 11 AM today.  Patient is quadriplegic so unclear how patient would have to be on her own.  Patient currently has a 5 French PEG tube on her person but removed.  HPI     Prior to Admission medications   Medication Sig Start Date End Date Taking? Authorizing Provider  acetaminophen  (TYLENOL ) 500 MG tablet Place 1,000 mg into feeding tube in the morning and at bedtime.    [provider]  amLODipine  (NORVASC ) 10 MG tablet Place 1 tablet (10 mg total) into feeding tube daily. 04/16/21   Alto Isaiah CROME, NP  aspirin  81 MG chewable tablet Place 81 mg into feeding tube daily.    [provider]  atorvastatin  (LIPITOR) 20 MG tablet Place 20 mg into feeding tube at bedtime.    [provider]  Baclofen  5 MG TABS Place 5 mg into feeding tube in the morning and at bedtime. 04/10/22   [provider]  barrier cream (NON-SPECIFIED) CREA Apply 1 Application topically See admin instructions. 1 application every shift    [provider]  chlorhexidine  (PERIDEX ) 0.12 % solution Use as directed 15 mLs in the mouth or throat 2 (two) times daily.    [provider]  Cholecalciferol (VITAMIN D -3) 5000 UNIT/ML LIQD Give 5,000 mLs by tube every Monday.    [provider]  diclofenac  Sodium (VOLTAREN ) 1 % GEL Apply 4 g topically 4 (four) times daily. Patient taking differently: Apply 4 g topically 2 (two) times daily as needed (knee pain). 04/15/21   Alto Isaiah CROME, NP  docusate (COLACE) 50 MG/5ML liquid Place 100 mg into feeding tube every other day.    [provider]  doxazosin   (CARDURA ) 2 MG tablet Place 1 tablet (2 mg total) into feeding tube daily. 04/16/21   Alto Isaiah CROME, NP  esomeprazole (NEXIUM) 20 MG capsule 20 mg every other day. Per tube    [provider]  ferrous sulfate 220 (44 Fe) MG/5ML solution Place 7.7 mLs into feeding tube daily.    [provider]  fluticasone  (FLONASE ) 50 MCG/ACT nasal spray Place 2 sprays into both nostrils daily. Patient taking differently: Place 2 sprays into both nostrils daily as needed for allergies. 04/16/21   Alto Isaiah CROME, NP  lidocaine  4 % Place 1 patch onto the skin every evening. Apply to right knee.    [provider]  Nutritional Supplements (NUTRITIONAL DRINK PO) Take by mouth. Isosource 1.5 ml/hr @63ml /hr 945 ml  Turn on at 1500 each day and turn off at 0600.    [provider]  Polyvinyl Alcohol-Povidone PF (REFRESH) 1.4-0.6 % SOLN Place 1 drop into both eyes daily.    [provider]  metoprolol  tartrate (LOPRESSOR ) 25 MG tablet Take 50 mg by mouth daily. 03/10/14 06/03/20  Advani, Deepak, MD    Allergies: Patient has no known allergies.    Review of Systems  Gastrointestinal:        PEG tube complication  All other systems reviewed and are negative.   Updated Vital Signs BP 122/82   Pulse 91   Temp 98.1 F (36.7 C)  Resp 16   LMP 03/03/2014   SpO2 100%   Physical Exam Vitals and nursing note reviewed.  Constitutional:      General: She is not in acute distress.    Appearance: She is well-developed.  HENT:     Head: Normocephalic and atraumatic.  Eyes:     Conjunctiva/sclera: Conjunctivae normal.  Cardiovascular:     Rate and Rhythm: Normal rate and regular rhythm.     Heart sounds: No murmur heard. Pulmonary:     Effort: Pulmonary effort is normal. No respiratory distress.     Breath sounds: Normal breath sounds.  Abdominal:     General: Bowel sounds are normal. There is no distension.     Palpations: Abdomen is soft.     Tenderness: There  is no abdominal tenderness.     Comments: PEG tube site appears unremarkable without evidence of infection. Tube is currently on patient's abdomen but not in place with balloon still inflated. No obvious signs of trauma or injury from tube being removed without balloon deflation.  Musculoskeletal:        General: No swelling.     Cervical back: Neck supple.  Skin:    General: Skin is warm and dry.     Capillary Refill: Capillary refill takes less than 2 seconds.  Neurological:     Mental Status: She is alert.  Psychiatric:        Mood and Affect: Mood normal.     (all labs ordered are listed, but only abnormal results are displayed) Labs Reviewed - No data to display  EKG: None  Radiology: DG ABDOMEN PEG TUBE LOCATION Result Date: 04/30/2024 EXAM: 1 VIEW XRAY OF THE ABDOMEN 04/30/2024 02:00:32 PM COMPARISON: 08/31/20025 CLINICAL HISTORY: 410191 PEG (percutaneous endoscopic gastrostomy) adjustment/replacement/removal (HCC) 410191. G-tube reinserted; Gastrografin - given through G-tube; [id number]; Exp- 10/2026 FINDINGS: LINES, TUBES AND DEVICES: Gastrostomy tube appears to be within gastric lumen. Normal contrast filling of stomach is noted. BOWEL: Nonobstructive bowel gas pattern. SOFT TISSUES: No opaque urinary calculi. BONES: No acute osseous abnormality. IMPRESSION: 1. Percutaneous gastrostomy tube appropriately positioned within the gastric lumen. 2. Unremarkable contrast opacification of the stomach. Electronically signed by: Lynwood Seip MD 04/30/2024 02:28 PM EDT RP Workstation: [id number]     Procedures   Medications Ordered in the ED  diatrizoate  meglumine -sodium (GASTROGRAFIN ) 66-10 % solution 30 mL (30 mLs Per Tube Given 04/30/24 1401)                                    Medical Decision Making  This patient presents to the ED for concern of PEG tube dislodgment.  Differential diagnosis includes PEG tube complications, bowel perforation, PEG tube  removal   Problem List / ED Course:  Patient with past history significant for quadriplegia, hypertension, seizures presents to the emergency department with concerns of PEG tube dislodgment.  Reportedly had PEG tube accidentally pulled during changing earlier today at around 11 AM.  Patient is currently at her baseline mentation and behavior. Exam reveals unremarkable abdominal findings.  PEG tube site appears to be not erythematous or swollen.  No appreciable drainage or discharge from the surrounding skin. Attempted PEG tube replacement with a 28 [place] with inability to smoothly passed tubing through.  Eventually had to use a 12 [place] due to diminished caliber of the sinus tract.  Confirmed placement with X-ray imaging.  Patient likely require some extent of dilation to  scale back up to 16 [place] size. PEG tube location confirmed on x-ray imaging.  Patient stable for outpatient follow-up and discharged home back to her care facility.   Social Determinants of Health:  Quadriplegia  Final diagnoses:  PEG tube malfunction [facility])    ED Discharge Orders     None          Cecily Legrand LABOR, PA-C 04/30/24 1502    Yolande Lamar BROCKS, MD 05/01/24 [phone]

## 2024-04-30 NOTE — ED Notes (Signed)
 PEG tube picked up by RN

## 2024-04-30 NOTE — ED Notes (Signed)
 Patient to XR

## 2024-04-30 NOTE — ED Notes (Signed)
 Awaiting gastrografin  from main pharm

## 2024-05-01 NOTE — ED Provider Notes (Signed)
.  Gastrostomy tube replacement  Date/Time: 05/01/2024 7:30 AM  Performed by: Yolande Lamar BROCKS, MD Authorized by: Yolande Lamar BROCKS, MD  Local anesthesia used: no  Anesthesia: Local anesthesia used: no  Sedation: Patient sedated: no  Patient tolerance: patient tolerated the procedure well with no immediate complications Comments: 4 French G-tube placed without difficulty.  Confirmed by x-ray.       Yolande Lamar BROCKS, MD 05/01/24 0730

## 2024-05-17 ENCOUNTER — Emergency Department (HOSPITAL_COMMUNITY)

## 2024-05-17 ENCOUNTER — Other Ambulatory Visit: Payer: Self-pay

## 2024-05-17 ENCOUNTER — Emergency Department (HOSPITAL_COMMUNITY)
Admission: EM | Admit: 2024-05-17 | Discharge: 2024-05-17 | Disposition: A | Discharge status: Skilled Nursing Facility | Attending: Emergency Medicine | Admitting: Emergency Medicine

## 2024-05-17 ENCOUNTER — Encounter (HOSPITAL_COMMUNITY): Payer: Self-pay

## 2024-05-17 DIAGNOSIS — K9420 Gastrostomy complication, unspecified: Secondary | ICD-10-CM

## 2024-05-17 DIAGNOSIS — Z8673 Personal history of transient ischemic attack (TIA), and cerebral infarction without residual deficits: Secondary | ICD-10-CM | POA: Diagnosis not present

## 2024-05-17 DIAGNOSIS — K9423 Gastrostomy malfunction: Secondary | ICD-10-CM | POA: Insufficient documentation

## 2024-05-17 DIAGNOSIS — I10 Essential (primary) hypertension: Secondary | ICD-10-CM | POA: Insufficient documentation

## 2024-05-17 NOTE — ED Notes (Signed)
 Attempted to flush g-tube with no success. EDP notified.

## 2024-05-17 NOTE — ED Provider Notes (Signed)
 [place] EMERGENCY DEPARTMENT AT [facility] Provider Note  MDM   HPI/ROS:  Charlene [name] is a 58 y.o. female with a PMH of quadriplegia, HTN, seizures, CVA, nonverbal at baseline who presents to the ED with G-tube dislodgment.  Patient had it replaced at our emergency department on 10/14 with a 12 French G-tube (he used to be in a 7 French but had to downsize secondary to tract being closed for too long).  Patient BIBEMS from [facility], for that he has had difficulty with flushing it and unable to push feeds through.  Patient afebrile, no other change in mental status.  On my initial evaluation, patient is:  -Vital signs stable. Patient afebrile, hemodynamically stable, and non-toxic appearing.  Physical exam is notable for: - French G-tube in place, unable to flush it --Unremarkable abdominal findings, PEG tube site appears without erythema or edema, no skin breakdown or purulence or discharge.  PEG tube replaced at bedside (see procedure note as above), able to flush and draw back gastric contents.  Confirmed with x-ray imaging. Patient stable for outpatient follow-up and discharge back home to her care facility.  Disposition:  I discussed the plan for discharge with the patient and/or their surrogate at bedside prior to discharge and they were in agreement with the plan and verbalized understanding of the return precautions provided. All questions answered to the best of my ability. Ultimately, the patient was discharged in stable condition with stable vital signs. I am reassured that they are capable of close follow up and good social support at home.   Clinical Impression:  1. Complication of gastrostomy tube [facility])      Clinical Complexity A medically appropriate history, review of systems, and physical exam was performed.  My independent interpretations of EKG, labs, and radiology are documented in the ED course above.   If decision rules were used in this  patient's evaluation, they are listed below.   Click here for ABCD2, HEART and other calculatorsREFRESH Note before signing   Patient's presentation is most consistent with acute, uncomplicated illness.  Medical Decision Making Amount and/or Complexity of Data Reviewed Radiology: ordered.    HPI/ROS      See MDM section for pertinent HPI and ROS. A complete ROS was performed with pertinent positives/negatives noted above.   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   Hypertension    Infective otitis externa of both ears    Middle ear effusion, bilateral 04/14/2021   Nausea & vomiting 02/21/2014   Tracheostomy dependent [facility])     Past Surgical History:  Procedure Laterality Date   CESAREAN SECTION     ESOPHAGOGASTRODUODENOSCOPY N/A 09/10/2020   Procedure: ESOPHAGOGASTRODUODENOSCOPY (EGD);  Surgeon: Kinsinger, Herlene Righter, MD;  Location: [facility] ENDOSCOPY;  Service: General;  Laterality: N/A;   IR REPLACE G-TUBE SIMPLE WO FLUORO  07/30/2021   IR REPLACE G-TUBE SIMPLE WO FLUORO  12/08/2022   IR REPLACE G-TUBE SIMPLE WO FLUORO  06/12/2023   IR REPLC GASTRO/COLONIC TUBE PERCUT W/FLUORO  02/16/2021   IR REPLC GASTRO/COLONIC TUBE PERCUT W/FLUORO  10/23/2021   IR REPLC GASTRO/COLONIC TUBE PERCUT W/FLUORO  04/14/2022   IR REPLC GASTRO/COLONIC TUBE PERCUT W/FLUORO  04/08/2024   PEG PLACEMENT N/A 09/10/2020   Procedure: PERCUTANEOUS ENDOSCOPIC GASTROSTOMY (PEG) PLACEMENT;  Surgeon: Stevie Herlene Righter, MD;  Location: [facility] ENDOSCOPY;  Service: General;  Laterality: N/A;      Physical Exam   There were no vitals filed for this visit.  Physical Exam Vitals and nursing note reviewed.  Constitutional:      General: She is  not in acute distress.    Appearance: She is well-developed.  HENT:     Head: Normocephalic and atraumatic.  Eyes:     Conjunctiva/sclera: Conjunctivae normal.  Cardiovascular:     Rate and Rhythm: Normal rate and regular rhythm.     Heart sounds: No murmur heard. Pulmonary:     Effort: Pulmonary effort is normal. No respiratory distress.     Breath sounds: Normal breath sounds.  Abdominal:     Palpations: Abdomen is soft.     Tenderness: There is no abdominal tenderness.     Comments: G-tube site without skin breakdown, erythema, edema, purulence  Musculoskeletal:        General: No swelling.     Cervical back: Neck supple.  Skin:    General: Skin is warm and dry.     Capillary Refill: Capillary refill takes less than 2 seconds.      Procedures   If procedures were preformed on this patient, they are listed below:  .Gastrostomy tube replacement  Date/Time: 05/17/2024 11:04 AM  Performed by: Billy Pal, MD Authorized by: Garrick Charleston, MD  Patient identity confirmed: arm band Local anesthesia used: no  Anesthesia: Local anesthesia used: no Patient tolerance: patient tolerated the procedure well with no immediate complications Comments: 24 French G-tube placed without difficulty.  Able to drawback gastric fluids, flushed without difficulty.  Confirmed by x-ray.    Please note that this documentation was produced with the assistance of voice-to-text technology and may contain errors.     Billy Pal, MD 05/19/24 1538    Garrick Charleston, MD 05/22/24 [phone]

## 2024-05-17 NOTE — ED Triage Notes (Signed)
 Pt BIB [facility] from [facility] for issues with feeding tube. Per EMS facility believes it is displaced, tried to flush tube with no success. Pt alert but nonverbal and does not follow commands at baseline. VSS per EMS.

## 2024-05-17 NOTE — ED Notes (Signed)
Peri care and brief change provided.

## 2024-05-17 NOTE — ED Notes (Signed)
 [facility] called

## 2024-05-17 NOTE — Discharge Instructions (Signed)
 12 French G-tube placed without difficulty. Confirmed by x-ray.

## 2024-06-12 ENCOUNTER — Inpatient Hospital Stay (HOSPITAL_COMMUNITY)
Admission: EM | Admit: 2024-06-12 | Discharge: 2024-06-15 | DRG: 641 | Disposition: A | Discharge status: Skilled Nursing Facility | Source: Skilled Nursing Facility | Attending: Student | Admitting: Student

## 2024-06-12 DIAGNOSIS — N3000 Acute cystitis without hematuria: Secondary | ICD-10-CM

## 2024-06-12 DIAGNOSIS — E785 Hyperlipidemia, unspecified: Secondary | ICD-10-CM | POA: Diagnosis present

## 2024-06-12 DIAGNOSIS — R6510 Systemic inflammatory response syndrome (SIRS) of non-infectious origin without acute organ dysfunction: Secondary | ICD-10-CM | POA: Diagnosis present

## 2024-06-12 DIAGNOSIS — I10 Essential (primary) hypertension: Secondary | ICD-10-CM | POA: Diagnosis present

## 2024-06-12 DIAGNOSIS — Z8679 Personal history of other diseases of the circulatory system: Secondary | ICD-10-CM

## 2024-06-12 DIAGNOSIS — E870 Hyperosmolality and hypernatremia: Principal | ICD-10-CM | POA: Diagnosis present

## 2024-06-12 NOTE — ED Triage Notes (Signed)
 Pt bib ems from [place] c/o abnormal labs, hypoxia, tachynea, and tachycardic. Pt is nonverbal and noted to pull at gtube at time.   BP 102/56 and 94/56 HR 112 RR 25 Capnography 35 2L 94% CBG 224 (EMS didn't notice hx DM2)

## 2024-06-13 ENCOUNTER — Inpatient Hospital Stay (HOSPITAL_COMMUNITY)

## 2024-06-13 ENCOUNTER — Encounter (HOSPITAL_COMMUNITY): Payer: Self-pay | Admitting: Family Medicine

## 2024-06-13 ENCOUNTER — Other Ambulatory Visit: Payer: Self-pay

## 2024-06-13 ENCOUNTER — Emergency Department (HOSPITAL_COMMUNITY)

## 2024-06-13 DIAGNOSIS — Z8679 Personal history of other diseases of the circulatory system: Secondary | ICD-10-CM | POA: Diagnosis not present

## 2024-06-13 DIAGNOSIS — E870 Hyperosmolality and hypernatremia: Secondary | ICD-10-CM | POA: Diagnosis not present

## 2024-06-13 DIAGNOSIS — I10 Essential (primary) hypertension: Secondary | ICD-10-CM

## 2024-06-13 DIAGNOSIS — R6510 Systemic inflammatory response syndrome (SIRS) of non-infectious origin without acute organ dysfunction: Secondary | ICD-10-CM | POA: Diagnosis not present

## 2024-06-13 LAB — CBC WITH DIFFERENTIAL/PLATELET
Abs Immature Granulocytes: 0.06 K/uL (ref 0.00–0.07)
Basophils Absolute: 0 K/uL (ref 0.0–0.1)
Basophils Relative: 0 %
Eosinophils Absolute: 0.1 K/uL (ref 0.0–0.5)
Eosinophils Relative: 1 %
HCT: 32.1 % — ABNORMAL LOW (ref 36.0–46.0)
Hemoglobin: 9.4 g/dL — ABNORMAL LOW (ref 12.0–15.0)
Immature Granulocytes: 1 %
Lymphocytes Relative: 21 %
Lymphs Abs: 2.4 K/uL (ref 0.7–4.0)
MCH: 25.2 pg — ABNORMAL LOW (ref 26.0–34.0)
MCHC: 29.3 g/dL — ABNORMAL LOW (ref 30.0–36.0)
MCV: 86.1 fL (ref 80.0–100.0)
Monocytes Absolute: 0.6 K/uL (ref 0.1–1.0)
Monocytes Relative: 5 %
Neutro Abs: 8 K/uL — ABNORMAL HIGH (ref 1.7–7.7)
Neutrophils Relative %: 72 %
Platelets: 145 K/uL — ABNORMAL LOW (ref 150–400)
RBC: 3.73 MIL/uL — ABNORMAL LOW (ref 3.87–5.11)
RDW: 17 % — ABNORMAL HIGH (ref 11.5–15.5)
WBC: 11.1 K/uL — ABNORMAL HIGH (ref 4.0–10.5)
nRBC: 0.4 % — ABNORMAL HIGH (ref 0.0–0.2)

## 2024-06-13 LAB — RESPIRATORY PANEL BY PCR

## 2024-06-13 LAB — URINALYSIS, W/ REFLEX TO CULTURE (INFECTION SUSPECTED)
Bilirubin Urine: NEGATIVE
Glucose, UA: NEGATIVE mg/dL
Hgb urine dipstick: NEGATIVE
Ketones, ur: NEGATIVE mg/dL
Nitrite: NEGATIVE
Protein, ur: NEGATIVE mg/dL
Specific Gravity, Urine: 1.023 (ref 1.005–1.030)
pH: 5 (ref 5.0–8.0)

## 2024-06-13 LAB — CBC
HCT: 30.3 % — ABNORMAL LOW (ref 36.0–46.0)
Hemoglobin: 9 g/dL — ABNORMAL LOW (ref 12.0–15.0)
MCH: 24.8 pg — ABNORMAL LOW (ref 26.0–34.0)
MCHC: 29.7 g/dL — ABNORMAL LOW (ref 30.0–36.0)
MCV: 83.5 fL (ref 80.0–100.0)
Platelets: 169 K/uL (ref 150–400)
RBC: 3.63 MIL/uL — ABNORMAL LOW (ref 3.87–5.11)
RDW: 16.3 % — ABNORMAL HIGH (ref 11.5–15.5)
WBC: 12.1 K/uL — ABNORMAL HIGH (ref 4.0–10.5)
nRBC: 0.2 % (ref 0.0–0.2)

## 2024-06-13 LAB — BASIC METABOLIC PANEL WITH GFR
Anion gap: 10 (ref 5–15)
BUN: 23 mg/dL — ABNORMAL HIGH (ref 6–20)
CO2: 25 mmol/L (ref 22–32)
Calcium: 8 mg/dL — ABNORMAL LOW (ref 8.9–10.3)
Chloride: 119 mmol/L — ABNORMAL HIGH (ref 98–111)
Creatinine, Ser: 0.63 mg/dL (ref 0.44–1.00)
GFR, Estimated: >60 mL/min (ref >60)
Glucose, Bld: 155 mg/dL — ABNORMAL HIGH (ref 70–99)
Potassium: 3.5 mmol/L (ref 3.5–5.1)
Sodium: 154 mmol/L — ABNORMAL HIGH (ref 135–145)

## 2024-06-13 LAB — GLUCOSE, CAPILLARY
Glucose-Capillary: 131 mg/dL — ABNORMAL HIGH (ref 70–99)
Glucose-Capillary: 143 mg/dL — ABNORMAL HIGH (ref 70–99)

## 2024-06-13 LAB — COMPREHENSIVE METABOLIC PANEL WITH GFR
ALT: 15 U/L (ref 0–44)
AST: 21 U/L (ref 15–41)
Albumin: 2.5 g/dL — ABNORMAL LOW (ref 3.5–5.0)
Alkaline Phosphatase: 60 U/L (ref 38–126)
Anion gap: 13 (ref 5–15)
BUN: 25 mg/dL — ABNORMAL HIGH (ref 6–20)
CO2: 25 mmol/L (ref 22–32)
Calcium: 8.3 mg/dL — ABNORMAL LOW (ref 8.9–10.3)
Chloride: 120 mmol/L — ABNORMAL HIGH (ref 98–111)
Creatinine, Ser: 0.71 mg/dL (ref 0.44–1.00)
GFR, Estimated: >60 mL/min (ref >60)
Glucose, Bld: 153 mg/dL — ABNORMAL HIGH (ref 70–99)
Potassium: 3.8 mmol/L (ref 3.5–5.1)
Sodium: 158 mmol/L — ABNORMAL HIGH (ref 135–145)
Total Bilirubin: 0.5 mg/dL (ref 0.0–1.2)
Total Protein: 6.5 g/dL (ref 6.5–8.1)

## 2024-06-13 LAB — RESP PANEL BY RT-PCR (RSV, FLU A&B, COVID)  RVPGX2
Influenza A by PCR: NEGATIVE
Influenza B by PCR: NEGATIVE
Resp Syncytial Virus by PCR: NEGATIVE
SARS Coronavirus 2 by RT PCR: NEGATIVE

## 2024-06-13 LAB — HIV ANTIBODY (ROUTINE TESTING W REFLEX): HIV Screen 4th Generation wRfx: NONREACTIVE

## 2024-06-13 LAB — PROCALCITONIN: Procalcitonin: <0.1 ng/mL

## 2024-06-13 LAB — PROTIME-INR
INR: 1.1 (ref 0.8–1.2)
Prothrombin Time: 14.5 s (ref 11.4–15.2)

## 2024-06-13 LAB — PHOSPHORUS: Phosphorus: 2.9 mg/dL (ref 2.5–4.6)

## 2024-06-13 LAB — I-STAT CG4 LACTIC ACID, ED: Lactic Acid, Venous: 1.9 mmol/L (ref 0.5–1.9)

## 2024-06-13 LAB — MAGNESIUM: Magnesium: 2.3 mg/dL (ref 1.7–2.4)

## 2024-06-13 MED ORDER — ACETAMINOPHEN 650 MG RE SUPP: 650.0000 mg | Freq: Four times a day (QID) | RECTAL | Status: DC | PRN

## 2024-06-13 MED ORDER — PROCHLORPERAZINE EDISYLATE 10 MG/2ML IJ SOLN
5.0000 mg | Freq: Four times a day (QID) | INTRAMUSCULAR | Status: DC | PRN
  Administered 2024-06-14 – 2024-06-15 (×2): 5 mg via INTRAVENOUS
  Filled 2024-06-13 (×2): qty 2

## 2024-06-13 MED ORDER — FREE WATER
200.0000 mL | Status: AC
  Administered 2024-06-13 – 2024-06-15 (×12): 200 mL

## 2024-06-13 MED ORDER — LACTATED RINGERS IV BOLUS (SEPSIS)
1000.0000 mL | Freq: Once | INTRAVENOUS | Status: AC
Start: 2024-06-13 — End: 2024-06-13
  Administered 2024-06-13: 1000 mL via INTRAVENOUS

## 2024-06-13 MED ORDER — JEVITY 1.5 CAL/FIBER PO LIQD
1000.0000 mL | ORAL | Status: DC
  Administered 2024-06-13: 1000 mL
  Filled 2024-06-13 (×6): qty 1000

## 2024-06-13 MED ORDER — SODIUM CHLORIDE 0.9 % IV SOLN
2.0000 g | Freq: Three times a day (TID) | INTRAVENOUS | Status: DC
  Administered 2024-06-13 – 2024-06-15 (×6): 2 g via INTRAVENOUS
  Filled 2024-06-13 (×6): qty 12.5

## 2024-06-13 MED ORDER — VANCOMYCIN HCL 1500 MG/300ML IV SOLN
1500.0000 mg | INTRAVENOUS | Status: DC
  Administered 2024-06-13 – 2024-06-14 (×2): 1500 mg via INTRAVENOUS
  Filled 2024-06-13 (×3): qty 300

## 2024-06-13 MED ORDER — POLYETHYLENE GLYCOL 3350 17 G PO PACK: 17.0000 g | PACK | Freq: Every day | ORAL | Status: DC | PRN

## 2024-06-13 MED ORDER — FREE WATER: 200.0000 mL | Status: DC

## 2024-06-13 MED ORDER — ACETAMINOPHEN 650 MG RE SUPP
650.0000 mg | Freq: Once | RECTAL | Status: AC
  Administered 2024-06-13: 650 mg via RECTAL
  Filled 2024-06-13: qty 1

## 2024-06-13 MED ORDER — DOXAZOSIN MESYLATE 2 MG PO TABS
2.0000 mg | ORAL_TABLET | Freq: Every day | ORAL | Status: DC
  Filled 2024-06-13: qty 1

## 2024-06-13 MED ORDER — LACTATED RINGERS IV BOLUS (SEPSIS): 500.0000 mL | Freq: Once | INTRAVENOUS | Status: DC

## 2024-06-13 MED ORDER — BACLOFEN 10 MG PO TABS
5.0000 mg | ORAL_TABLET | Freq: Two times a day (BID) | ORAL | Status: DC
  Administered 2024-06-13 – 2024-06-15 (×6): 5 mg
  Filled 2024-06-13 (×6): qty 1

## 2024-06-13 MED ORDER — VANCOMYCIN HCL IN DEXTROSE 1-5 GM/200ML-% IV SOLN: 1000.0000 mg | INTRAVENOUS | Status: DC

## 2024-06-13 MED ORDER — PANTOPRAZOLE SODIUM 40 MG PO PACK: 40.0000 mg | PACK | Freq: Every day | ORAL | Status: DC

## 2024-06-13 MED ORDER — SENNOSIDES-DOCUSATE SODIUM 8.6-50 MG PO TABS: 1.0000 | ORAL_TABLET | Freq: Every evening | ORAL | Status: DC | PRN

## 2024-06-13 MED ORDER — OMEPRAZOLE 2 MG/ML ORAL SUSPENSION
20.0000 mg | Freq: Every day | ORAL | Status: DC
  Administered 2024-06-13 – 2024-06-15 (×3): 20 mg
  Filled 2024-06-13 (×4): qty 10

## 2024-06-13 MED ORDER — METRONIDAZOLE 500 MG/100ML IV SOLN
500.0000 mg | Freq: Two times a day (BID) | INTRAVENOUS | Status: DC
  Administered 2024-06-13 – 2024-06-15 (×6): 500 mg via INTRAVENOUS
  Filled 2024-06-13 (×6): qty 100

## 2024-06-13 MED ORDER — BISACODYL 10 MG RE SUPP: 10.0000 mg | Freq: Every day | RECTAL | Status: DC | PRN

## 2024-06-13 MED ORDER — ACETAMINOPHEN 325 MG PO TABS
650.0000 mg | ORAL_TABLET | Freq: Four times a day (QID) | ORAL | Status: DC | PRN
  Administered 2024-06-14 (×2): 650 mg
  Filled 2024-06-13 (×2): qty 2

## 2024-06-13 MED ORDER — ATORVASTATIN CALCIUM 10 MG PO TABS
20.0000 mg | ORAL_TABLET | Freq: Every day | ORAL | Status: DC
  Administered 2024-06-13 – 2024-06-14 (×2): 20 mg
  Filled 2024-06-13 (×2): qty 2

## 2024-06-13 MED ORDER — ENOXAPARIN SODIUM 30 MG/0.3ML IJ SOSY
30.0000 mg | PREFILLED_SYRINGE | INTRAMUSCULAR | Status: DC
  Administered 2024-06-13 – 2024-06-15 (×3): 30 mg via SUBCUTANEOUS
  Filled 2024-06-13 (×3): qty 0.3

## 2024-06-13 MED ORDER — METRONIDAZOLE 500 MG/100ML IV SOLN: 500.0000 mg | Freq: Once | INTRAVENOUS | Status: DC

## 2024-06-13 MED ORDER — SODIUM CHLORIDE 0.9 % IV SOLN
2.0000 g | Freq: Two times a day (BID) | INTRAVENOUS | Status: DC
  Administered 2024-06-13: 2 g via INTRAVENOUS
  Filled 2024-06-13: qty 12.5

## 2024-06-13 MED ORDER — ASPIRIN 81 MG PO CHEW
81.0000 mg | CHEWABLE_TABLET | Freq: Every day | ORAL | Status: DC
  Administered 2024-06-13 – 2024-06-15 (×3): 81 mg
  Filled 2024-06-13 (×3): qty 1

## 2024-06-13 MED ORDER — SODIUM CHLORIDE 0.9% FLUSH
3.0000 mL | Freq: Two times a day (BID) | INTRAVENOUS | Status: DC
  Administered 2024-06-13 – 2024-06-15 (×5): 3 mL via INTRAVENOUS

## 2024-06-13 MED ORDER — VANCOMYCIN HCL IN DEXTROSE 1-5 GM/200ML-% IV SOLN
1000.0000 mg | Freq: Once | INTRAVENOUS | Status: AC
  Administered 2024-06-13: 1000 mg via INTRAVENOUS
  Filled 2024-06-13: qty 200

## 2024-06-13 MED ORDER — SENNOSIDES-DOCUSATE SODIUM 8.6-50 MG PO TABS
1.0000 | ORAL_TABLET | Freq: Two times a day (BID) | ORAL | Status: AC
  Administered 2024-06-13 (×2): 1
  Filled 2024-06-13 (×2): qty 1

## 2024-06-13 MED ORDER — LACTATED RINGERS IV SOLN: INTRAVENOUS | Status: DC

## 2024-06-13 MED ORDER — BACLOFEN 10 MG PO TABS: 5.0000 mg | ORAL_TABLET | Freq: Every morning | ORAL | Status: DC

## 2024-06-13 MED ORDER — POLYETHYLENE GLYCOL 3350 17 G PO PACK
17.0000 g | PACK | Freq: Two times a day (BID) | ORAL | Status: DC
  Administered 2024-06-13 – 2024-06-15 (×5): 17 g
  Filled 2024-06-13 (×5): qty 1

## 2024-06-13 MED ORDER — SODIUM CHLORIDE 0.45 % IV SOLN: INTRAVENOUS | Status: AC

## 2024-06-13 MED ORDER — SODIUM CHLORIDE 0.9 % IV SOLN
2.0000 g | Freq: Once | INTRAVENOUS | Status: AC
  Administered 2024-06-13: 2 g via INTRAVENOUS
  Filled 2024-06-13: qty 12.5

## 2024-06-13 NOTE — Progress Notes (Signed)
 Pharmacy Antibiotic Note  Charlene [name] is a 59 y.o. female admitted on 06/12/2024 with sepsis.  Pharmacy has been consulted for Vancomycin  dosing. WBC is mildly elevated. Renal function appears ok. Febrile up to 100.5.   Plan: Vancomycin  1000 mg IV q24h >>>Estimated AUC: 464 Cefepime /Flagyl  per MD Trend WBC, temp, renal function  F/U infectious work-up Drug levels as indicated  Temp (24hrs), Avg:99.3 F (37.4 C), Min:98.2 F (36.8 C), Max:100.5 F (38.1 C)  Recent Labs  Lab 06/13/24 0021 06/13/24 0032 06/13/24 0207 06/13/24 0346  WBC 11.1*  --   --  12.1*  CREATININE 0.71  --  0.63  --   LATICACIDVEN  --  1.9  --   --     CrCl cannot be calculated (Unknown ideal weight.).    No Known Allergies  Lynwood Mckusick, PharmD, BCPS Clinical Pharmacist Phone: [phone]

## 2024-06-13 NOTE — ED Notes (Signed)
 RN called Juliene Blumenthal, spouse, to inform pt is in hospital.

## 2024-06-13 NOTE — Plan of Care (Signed)

## 2024-06-13 NOTE — Plan of Care (Signed)
 Patient was seen and examined at bedside during morning rounds. Patient was admitted overnight due to source, no source of infection, we will continue current antibiotics. Workup so far negative, awaiting for blood cultures. Hypernatremia could be secondary to dehydration. Started free water  via PEG tube. Nutrition is consulted to start PEG tube feeding Lactic acid negative, procalcitonin negative.  Mild leukocytosis. Patient remained contracted with left-sided residual hemiparesis and rightward gaze.  Nonverbal. Overall long-term prognosis is poor.  We will continue current treatment and follow along.  H&P reviewed No charge note, admitted today after midnight.

## 2024-06-13 NOTE — Progress Notes (Addendum)
 Pharmacy Antibiotic Note  Charlene [name] is a 58 y.o. female admitted on 06/12/2024 with sepsis.  Pharmacy has been consulted for cefepime  and vancomycin  dosing.  Plan: S/p Vancomycin  1000mg  IV x1 Vancomycin  1500 mg IV q24h (eAUC 492.9, Vd 0.72, Scr 0.8) Increase Cefepime  to 2g IV q8h Monitor renal function daily for dose adjustments as needed  Weight: 67.8 kg (149 lb 7.6 oz)  Temp (24hrs), Avg:99 F (37.2 C), Min:98.1 F (36.7 C), Max:100.5 F (38.1 C)  Recent Labs  Lab 06/13/24 0021 06/13/24 0032 06/13/24 0207 06/13/24 0346  WBC 11.1*  --   --  12.1*  CREATININE 0.71  --  0.63  --   LATICACIDVEN  --  1.9  --   --     Estimated Creatinine Clearance: 69.8 mL/min (by C-G formula based on SCr of 0.63 mg/dL).    No Known Allergies  Microbiology results: 11/27 BCx: ngtd 11/27 Resp panel neg  Thank you for allowing pharmacy to be a part of this patient's care.  Charlene [name], PharmD PGY1 Clinical Pharmacist [name]  06/13/2024 10:44 AM

## 2024-06-13 NOTE — Progress Notes (Signed)
 Initial Nutrition Assessment  DOCUMENTATION CODES:   Not applicable  INTERVENTION:  Initiate TF via PEG  Jevity 1.5 @ 50 ml/hr. Total volume 1200 ml/d Provides 1800 kcals, 77 grams protein, 912 ml free water   Adjust FWF as needed for hypernatremia    NUTRITION DIAGNOSIS:   Inadequate oral intake related to dysphagia as evidenced by NPO status.   GOAL:   Patient will meet greater than or equal to 90% of their needs   MONITOR:   Labs, TF tolerance  REASON FOR ASSESSMENT:   Consult Enteral/tube feeding initiation and management  ASSESSMENT:   Past medical history significant for ICH with residual aphasia, dysphagia, and left hemiparesis, hypertension, and chronic anemia who presents for evaluation of hypoxia, tachycardia, tachypnea, and abnormal blood work.  Patient seen in room sleeping, non arousable. Pt with PEG tube feeds at baseline. Pt nonverbal, unable to obtain home TF regimen or weight history.  Per home meds list, ordered TF regimen as follows: Isosource 1.5 ml/hr @63ml /hr 945 ml. Turn on at 1500 each day and turn off at 0600. Formulary equivalent for [name] is Jevity 1.5. Bedscale weight taken an updated in chart. Appears that patient has gained weight over the past 2 months if previous chart weight is accurate. Mild muscle wasting noted to lower extremities.   Admit/current weight: 67.8 kg   Nutritionally Relevant Medications: NS 3 ml flush q 3 hrs   Labs Reviewed: Na 154, Cl 119, Glu 155, BUN 23, Calcium  8.0  NUTRITION - FOCUSED PHYSICAL EXAM:  Flowsheet Row Most Recent Value  Orbital Region No depletion  Upper Arm Region No depletion  Thoracic and Lumbar Region No depletion  Buccal Region No depletion  Temple Region No depletion  Clavicle Bone Region No depletion  Clavicle and Acromion Bone Region No depletion  Scapular Bone Region No depletion  Dorsal Hand No depletion  Patellar Region Mild depletion  Anterior Thigh Region Mild depletion   Posterior Calf Region Mild depletion  Hair Reviewed  Eyes Unable to assess  [sleeping]  Mouth Unable to assess  [sleeping]  Skin Reviewed  Nails Reviewed    Diet Order:   Diet Order             Diet NPO time specified  Diet effective now                   EDUCATION NEEDS:   Not appropriate for education at this time  Skin:  Skin Assessment: Reviewed RN Assessment  Last BM:  PTA  Height:   Ht Readings from Last 1 Encounters:  04/07/24 5' 5 (1.651 m)    Weight:   Wt Readings from Last 1 Encounters:  06/13/24 67.8 kg    Ideal Body Weight:  56.8 kg  BMI:  Body mass index is 24.87 kg/m.  Estimated Nutritional Needs:   Kcal:  1700-1900  Protein:  70-85 g  Fluid:  1.7-1.9 L/d  Madalyn Potters, MS, RD, LDN Clinical Dietitian  Contact via secure chat. If unavailable, use group chat RD Inpatient.

## 2024-06-13 NOTE — ED Provider Notes (Signed)
 [facility] Provider Note   CSN: [id number] Arrival date & time: 06/12/24  2339     Patient presents with: Code Sepsis  Level 5 caveat due to altered mental status Charlene [name] is a 58 y.o. female.   The history is provided by the nursing home.  Patient with previous history of intracerebral hemorrhage, bedbound, G-tube dependent presents from nursing home For abnormal labs, tachypnea and tachycardia.  Patient is nonverbal at baseline. 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 Authorizing Provider  acetaminophen  (TYLENOL ) 500 MG tablet Place 1,000 mg into feeding tube in the morning and at bedtime.    [provider]  amLODipine  (NORVASC ) 10 MG tablet Place 1 tablet (10 mg total) into feeding tube daily. 04/16/21   Alto Isaiah CROME, NP  aspirin  81 MG chewable tablet Place 81 mg into feeding tube daily.    [provider]  atorvastatin  (LIPITOR) 20 MG tablet Place 20 mg into feeding tube at bedtime.    [provider]  Baclofen  5 MG TABS Place 5 mg into feeding tube in the morning and at bedtime. 04/10/22   [provider]  barrier cream (NON-SPECIFIED) CREA Apply 1 Application topically See admin instructions. 1 application every shift    [provider]  chlorhexidine  (PERIDEX ) 0.12 % solution Use as directed 15 mLs in the mouth or throat  2 (two) times daily.    [provider]  Cholecalciferol (VITAMIN D -3) 5000 UNIT/ML LIQD Give 5,000 mLs by tube every Monday.    [provider]  diclofenac  Sodium (VOLTAREN ) 1 % GEL Apply 4 g topically 4 (four) times daily. Patient taking differently: Apply 4 g topically 2 (two) times daily as needed (knee pain). 04/15/21   Alto Isaiah CROME, NP  docusate (COLACE) 50 MG/5ML liquid Place 100 mg into feeding tube every other day.    [provider]  doxazosin  (CARDURA ) 2 MG tablet Place 1 tablet (2 mg total) into feeding tube daily. 04/16/21   Alto Isaiah CROME, NP  esomeprazole (NEXIUM) 20 MG capsule 20 mg every other day. Per tube    [provider]  ferrous sulfate 220 (44 Fe) MG/5ML solution Place 7.7 mLs into feeding tube daily.    [provider]  fluticasone  (FLONASE ) 50 MCG/ACT nasal spray Place 2 sprays into both nostrils daily. Patient taking differently: Place 2 sprays into both nostrils daily as needed for allergies. 04/16/21   Alto Isaiah CROME, NP  lidocaine  4 % Place 1 patch onto the skin every evening. Apply to right knee.    [provider]  Nutritional Supplements (NUTRITIONAL DRINK PO) Take by mouth. Isosource 1.5 ml/hr @63ml /hr 945 ml  Turn on at 1500 each day and turn off at 0600.    [provider]  Polyvinyl Alcohol-Povidone PF (REFRESH) 1.4-0.6 % SOLN  Place 1 drop into both eyes daily.    [provider]  metoprolol  tartrate (LOPRESSOR ) 25 MG tablet Take 50 mg by mouth daily. 03/10/14 06/03/20  Advani, Deepak, MD    Allergies: Patient has no known allergies.    Review of Systems  Unable to perform ROS: Mental status change    Updated Vital Signs BP 114/74   Pulse (!) 111   Temp (!) 100.5 F (38.1 C) (Rectal)   Resp (!) 22   LMP 03/03/2014   SpO2 94%   Physical Exam CONSTITUTIONAL: Chronically ill-appearing HEAD: Normocephalic/atraumatic EYES: R gaze deviation noted ENMT: Mucous membranes moist,  no angioedema NECK: supple no meningeal signs CV: S1/S2 noted, tachycardic LUNGS: Tachypnea, coarse breath sound bilaterally ABDOMEN: soft, nontender, nondistended, G-tube in place is clean dry and intact GU: Wearing a diaper, no overlying erythema noted to genitalia, nurse present for exam NEURO: Pt is awake and alert but nonverbal.  She appears to move her right arm and right leg.  She does not follow commands EXTREMITIES: pulses normal/equal, full ROM, chronic contractures are noted.  No deformities.  No wounds to her feet SKIN: warm, color normal No sacral wounds are noted  (all labs ordered are listed, but only abnormal results are displayed) Labs Reviewed  COMPREHENSIVE METABOLIC PANEL WITH GFR - Abnormal; Notable for the following components:      Result Value   Sodium 158 (*)    Chloride 120 (*)    Glucose, Bld 153 (*)    BUN 25 (*)    Calcium  8.3 (*)    Albumin 2.5 (*)    All other components within normal limits  CBC WITH DIFFERENTIAL/PLATELET - Abnormal; Notable for the following components:   WBC 11.1 (*)    RBC 3.73 (*)    Hemoglobin 9.4 (*)    HCT 32.1 (*)    MCH 25.2 (*)    MCHC 29.3 (*)    RDW 17.0 (*)    Platelets 145 (*)    nRBC 0.4 (*)    Neutro Abs 8.0 (*)    All other components within normal limits  URINALYSIS, W/ REFLEX TO CULTURE (INFECTION SUSPECTED) - Abnormal; Notable for the following components:   APPearance HAZY (*)    Leukocytes,Ua MODERATE (*)    Bacteria, UA RARE (*)    All other components within normal limits  RESP PANEL BY RT-PCR (RSV, FLU A&B, COVID)  RVPGX2  CULTURE, BLOOD (ROUTINE X 2)  CULTURE, BLOOD (ROUTINE X 2)  RESP PANEL BY RT-PCR (RSV, FLU A&B, COVID)  RVPGX2  URINE CULTURE  PROTIME-INR  HIV ANTIBODY (ROUTINE TESTING W REFLEX)  BASIC METABOLIC PANEL WITH GFR  CBC  PROCALCITONIN  I-STAT CG4 LACTIC ACID, ED    EKG: None  Radiology: DG Chest Port 1 View Result Date: 06/13/2024 EXAM: 1 VIEW(S) XRAY OF THE CHEST  06/13/2024 12:31:00 AM COMPARISON: Portable chest 02/24/2022. CLINICAL HISTORY: Questionable sepsis - evaluate for abnormality. FINDINGS: LINES, TUBES AND DEVICES: There is a tangle of overlying monitor wiring. LUNGS AND PLEURA: There is a low inspiration on exam. Limited view of the bases. No focal pneumonia seen in the visualized aerated lungs. No pleural effusion. No pneumothorax. HEART AND MEDIASTINUM: No acute abnormality of the cardiac and mediastinal silhouettes. BONES AND SOFT TISSUES: No acute osseous abnormality. IMPRESSION: 1. No acute cardiopulmonary process identified. 2. Evaluation is limited by low inspiratory effort. Basilar disease could be missed. Electronically signed by: Francis Quam MD 06/13/2024 12:45 AM EST RP Workstation: [id number]     .  Critical Care  Performed by: Midge Golas, MD Authorized by: Midge Golas, MD   Critical care provider statement:    Critical care time (minutes):  80   Critical care start time:  06/13/2024 12:30 AM   Critical care end time:  06/13/2024 1:50 AM   Critical care time was exclusive of:  Separately billable procedures and treating other patients   Critical care was necessary to treat or prevent imminent or life-threatening deterioration of the following conditions:  Sepsis, respiratory failure and shock   Critical care was time spent personally by me on the following activities:  Examination of patient, review of old charts, re-evaluation of patient's condition, pulse oximetry, ordering and review of laboratory studies, ordering and performing treatments and interventions, ordering and review of radiographic studies, development of treatment plan with patient or surrogate, discussions with consultants, evaluation of patient's response to treatment and obtaining history from patient or surrogate   I assumed direction of critical care for this patient from another provider in my specialty: no     Care discussed with: admitting provider       Medications Ordered in the ED  vancomycin  (VANCOCIN ) IVPB 1000 mg/200 mL premix (has no administration in time range)  acetaminophen  (TYLENOL ) suppository 650 mg (has no administration in time range)  atorvastatin  (LIPITOR) tablet 20 mg (has no administration in time range)  doxazosin  (CARDURA ) tablet 2 mg (has no administration in time range)  pantoprazole  sodium (PROTONIX ) 40 mg (has no administration in time range)  Baclofen  TABS 5 mg (has no administration in time range)  enoxaparin  (LOVENOX ) injection 40 mg (has no administration in time range)  sodium chloride  flush (NS) 0.9 % injection 3 mL (has no administration in time range)  0.45 % sodium chloride  infusion (has no administration in time range)  acetaminophen  (TYLENOL ) tablet 650 mg (has no administration in time range)    Or  acetaminophen  (TYLENOL ) suppository 650 mg (has no administration in time range)  polyethylene glycol (MIRALAX  / GLYCOLAX ) packet 17 g (has no administration in time range)  prochlorperazine  (COMPAZINE ) injection 5 mg (has no administration in time range)  metroNIDAZOLE  (FLAGYL ) IVPB 500 mg (has no administration in time range)  lactated ringers  bolus 1,000 mL (0 mLs Intravenous Stopped 06/13/24 0145)  ceFEPIme  (MAXIPIME ) 2 g in sodium chloride  0.9 % 100 mL IVPB (0 g Intravenous Stopped 06/13/24 0145)    Clinical Course as of 06/13/24 0154  Thu Jun 13, 2024  0025 Patient presents from local nursing facility.  I spoke to the nurse Paulina who reports the patient has been sick and the doctor instructed to send her to the ER if they were unable to obtain labs.  No other details are known from nursing home [DW]  0030 Patient is febrile, tachycardic.  Code sepsis has been called.  IV fluids IV antibiotics given ordered.  Per records, patient is already on Augmentin  for unclear reasons  [DW]  0128 Sodium(!): 158 Hypernatremia [DW]  0129 WBC(!): 11.1 Leukocytosis [DW]  0129 Hemoglobin(!): 9.4 Anemia [DW]   0150 X-ray is negative.  Lactate negative.  Patient does have hyponatremia which could be contributing to her altered mental status.  Also noted to have a UTI as nursing staff was able obtain urine via catheter  IV antibiotics and skilled back.  I will also scale back her IV fluids in the setting of a normal lactate, improved blood pressure and hypernatremia [DW]  0151 Of note, patient does have a history of right gaze deviation  that is been seen by neurology and not felt to be due to seizures She currently has a right gaze deviation. Will defer any workup or treatment this time [DW]  0152 Discussed with Dr. Charlton for admission [DW]    Clinical Course User Index [DW] Midge Golas, MD                                 Medical Decision Making Amount and/or Complexity of Data Reviewed Labs: ordered. Decision-making details documented in ED Course. Radiology: ordered.  Risk OTC drugs. Prescription drug management. Decision regarding hospitalization.   This patient presents to the ED for concern of fever, this involves an extensive number of treatment options, and is a complaint that carries with it a high risk of complications and morbidity.  The differential diagnosis includes but is not limited to meningitis, UTI, pneumonia, COVID-19, influenza, bacteremia  Comorbidities that complicate the patient evaluation: Patient's presentation is complicated by their history of intracerebral hemorrhage  Social Determinants of Health: Patient's nonverbal and bedbound  increases the complexity of managing their presentation  Additional history obtained: Additional history obtained from nursing home/care facility Records reviewed previous admission documents  Lab Tests: I Ordered, and personally interpreted labs.  The pertinent results include: Hypernatremia, anemia, leukocytosis  Imaging Studies ordered: I ordered imaging studies including X-ray chest  I independently visualized and  interpreted imaging which showed no acute findings I agree with the radiologist interpretation  Cardiac Monitoring: The patient was maintained on a cardiac monitor.  I personally viewed and interpreted the cardiac monitor which showed an underlying rhythm of:  sinus tachycardia  Medicines ordered and prescription drug management: I ordered medication including IV fluids and antibiotics for presumed sepsis Reevaluation of the patient after these medicines showed that the patient    improved   Critical Interventions:   IV fluids and antibiotics  Consultations Obtained: I requested consultation with the admitting physician [facility], and discussed  findings as well as pertinent plan - they recommend: Admit  Reevaluation: After the interventions noted above, I reevaluated the patient and found that they have :improved  Complexity of problems addressed: Patient's presentation is most consistent with  acute presentation with potential threat to life or bodily function  Disposition: After consideration of the diagnostic results and the patient's response to treatment,  I feel that the patent would benefit from admission  .        Final diagnoses:  Hypernatremia  Acute cystitis without hematuria    ED Discharge Orders     None          Midge Golas, MD 06/13/24 [phone]

## 2024-06-13 NOTE — H&P (Addendum)
 History and Physical    Charlene [name] FMW:[id number] DOB: 1966/01/21 DOA: 06/12/2024  PCP: Leonidas   Patient coming from: SNF    Chief Complaint: Hypoxia, tachycardia, tachypnea, abnormal labs   HPI: Charlene [name] is a 58 y.o. female with medical history significant for ICH with residual aphasia, dysphagia, and left hemiparesis, hypertension, and chronic anemia who presents for evaluation of hypoxia, tachycardia, tachypnea, and abnormal blood work.  Patient had labs drawn that were notable for sodium of 162 on 06/11/2024.  She was reportedly started on Augmentin  and 5% dextrose  in half-normal saline at her facility.  ED Course: Upon arrival to the ED, patient is found to be febrile to 38.1 C and saturating mid 90s on room air with tachypnea, tachycardia, and stable blood pressure.  Labs are most notable for sodium 158, normal creatinine, WBC 11,100, hemoglobin 9.4, platelets 145,000, and lactic acid 1.9.  There is rare bacteria and moderate leukocytes on urine dipstick, and 21-50 WBC/hpf on microscopy.  No acute findings noted on chest x-ray.  Blood and urine cultures were collected in the ED and the patient was given a liter of LR, vancomycin , cefepime , and acetaminophen .  Review of Systems:  ROS limited by patient's clinical condition.  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   Hypertension    Infective otitis externa of both ears    Middle ear effusion, bilateral 04/14/2021   Nausea & vomiting 02/21/2014   Tracheostomy dependent [facility])     Past Surgical History:  Procedure Laterality Date   CESAREAN SECTION     ESOPHAGOGASTRODUODENOSCOPY N/A 09/10/2020   Procedure: ESOPHAGOGASTRODUODENOSCOPY  (EGD);  Surgeon: Kinsinger, Herlene Righter, MD;  Location: [facility] ENDOSCOPY;  Service: General;  Laterality: N/A;   IR REPLACE G-TUBE SIMPLE WO FLUORO  07/30/2021   IR REPLACE G-TUBE SIMPLE WO FLUORO  12/08/2022   IR REPLACE G-TUBE SIMPLE WO FLUORO  06/12/2023   IR REPLC GASTRO/COLONIC TUBE PERCUT W/FLUORO  02/16/2021   IR REPLC GASTRO/COLONIC TUBE PERCUT W/FLUORO  10/23/2021   IR REPLC GASTRO/COLONIC TUBE PERCUT W/FLUORO  04/14/2022   IR REPLC GASTRO/COLONIC TUBE PERCUT W/FLUORO  04/08/2024   PEG PLACEMENT N/A 09/10/2020   Procedure: PERCUTANEOUS ENDOSCOPIC GASTROSTOMY (PEG) PLACEMENT;  Surgeon: Stevie Herlene Righter, MD;  Location: [facility] ENDOSCOPY;  Service: General;  Laterality: N/A;    Social History:   reports that she has never smoked. She has never used smokeless tobacco. She reports that she does not drink alcohol and does not use drugs.  No Known Allergies  Family History  Problem Relation Age of Onset   Hypertension Father    Cancer Maternal Grandmother    Thyroid disease Paternal Uncle      Prior to Admission medications   Medication Sig Start Date End Date Taking? Authorizing Provider  baclofen  (LIORESAL ) 10 MG tablet Take 10 mg by mouth daily. 05/10/24  Yes [provider]  Dextrose -Sodium Chloride  (DEXTROSE  5 % AND 0.45 % NACL) 5-0.45 % infusion  06/11/24  Yes [provider]  nystatin (MYCOSTATIN/NYSTOP) powder Apply topically. 04/11/24  Yes [provider]  acetaminophen  (TYLENOL ) 500 MG tablet Place 1,000 mg into feeding tube in the morning and at bedtime.  [provider]  amLODipine  (NORVASC ) 10 MG tablet Place 1 tablet (10 mg total) into feeding tube daily. 04/16/21   Alto Isaiah CROME, NP  aspirin  81 MG chewable tablet Place 81 mg into feeding tube daily.    [provider]  atorvastatin  (LIPITOR) 20 MG tablet Place 20 mg into feeding tube at bedtime.    [provider]  Baclofen  5 MG TABS Place 5 mg into feeding tube in the  morning and at bedtime. 04/10/22   [provider]  barrier cream (NON-SPECIFIED) CREA Apply 1 Application topically See admin instructions. 1 application every shift    [provider]  chlorhexidine  (PERIDEX ) 0.12 % solution Use as directed 15 mLs in the mouth or throat 2 (two) times daily.    [provider]  Cholecalciferol (VITAMIN D -3) 5000 UNIT/ML LIQD Give 5,000 mLs by tube every Monday.    [provider]  diclofenac  Sodium (VOLTAREN ) 1 % GEL Apply 4 g topically 4 (four) times daily. Patient taking differently: Apply 4 g topically 2 (two) times daily as needed (knee pain). 04/15/21   Alto Isaiah CROME, NP  docusate (COLACE) 50 MG/5ML liquid Place 100 mg into feeding tube every other day.    [provider]  doxazosin  (CARDURA ) 2 MG tablet Place 1 tablet (2 mg total) into feeding tube daily. 04/16/21   Alto Isaiah CROME, NP  esomeprazole (NEXIUM) 20 MG capsule 20 mg every other day. Per tube    [provider]  ferrous sulfate 220 (44 Fe) MG/5ML solution Place 7.7 mLs into feeding tube daily.    [provider]  fluticasone  (FLONASE ) 50 MCG/ACT nasal spray Place 2 sprays into both nostrils daily. Patient taking differently: Place 2 sprays into both nostrils daily as needed for allergies. 04/16/21   Alto Isaiah CROME, NP  lidocaine  4 % Place 1 patch onto the skin every evening. Apply to right knee.    [provider]  Nutritional Supplements (NUTRITIONAL DRINK PO) Take by mouth. Isosource 1.5 ml/hr @63ml /hr 945 ml  Turn on at 1500 each day and turn off at 0600.    [provider]  Polyvinyl Alcohol-Povidone PF (REFRESH) 1.4-0.6 % SOLN Place 1 drop into both eyes daily.    [provider]  metoprolol  tartrate (LOPRESSOR ) 25 MG tablet Take 50 mg by mouth daily. 03/10/14 06/03/20  Colton Drape, MD    Physical Exam: Vitals:   06/13/24 0045 06/13/24 0115 06/13/24 0206 06/13/24 0215  BP: 113/76 114/74  (!)  109/90  Pulse: (!) 107 (!) 111  (!) 110  Resp: (!) 27 (!) 22  (!) 30  Temp:   99.2 F (37.3 C)   TempSrc:   Axillary   SpO2: 95% 94%  96%    Constitutional: NAD, no pallor   Eyes: PERTLA, lids and conjunctivae normal ENMT: Mucous membranes are moist. Posterior pharynx clear of any exudate or lesions.   Neck: supple, no masses  Respiratory: Mild tachypnea. No wheezing.   Cardiovascular: Rate ~120 and regular. No extremity edema. Abdomen: Soft, no guarding. Bowel sounds active.  Musculoskeletal: no clubbing / cyanosis. Contractures. No meningismus.  Skin: no significant rashes, lesions, ulcers. Warm, dry, well-perfused. Neurologic: Awake, rightward gaze. Moves RUE on command. No verbalizations. Left hemiparesis.      Labs and Imaging on Admission: I have personally reviewed following labs and imaging studies  CBC: Recent Labs  Lab 06/13/24 0021  WBC 11.1*  NEUTROABS 8.0*  HGB 9.4*  HCT 32.1*  MCV  86.1  PLT 145*   Basic Metabolic Panel: Recent Labs  Lab 06/13/24 0021  NA 158*  K 3.8  CL 120*  CO2 25  GLUCOSE 153*  BUN 25*  CREATININE 0.71  CALCIUM  8.3*   GFR: CrCl cannot be calculated (Unknown ideal weight.). Liver Function Tests: Recent Labs  Lab 06/13/24 0021  AST 21  ALT 15  ALKPHOS 60  BILITOT 0.5  PROT 6.5  ALBUMIN 2.5*   No results for input(s): LIPASE, AMYLASE in the last 168 hours. No results for input(s): AMMONIA in the last 168 hours. Coagulation Profile: Recent Labs  Lab 06/13/24 0021  INR 1.1   Cardiac Enzymes: No results for input(s): CKTOTAL, CKMB, CKMBINDEX, TROPONINI in the last 168 hours. BNP (last 3 results) No results for input(s): PROBNP in the last 8760 hours. HbA1C: No results for input(s): HGBA1C in the last 72 hours. CBG: No results for input(s): GLUCAP in the last 168 hours. Lipid Profile: No results for input(s): CHOL, HDL, LDLCALC, TRIG, CHOLHDL, LDLDIRECT in the last 72  hours. Thyroid Function Tests: No results for input(s): TSH, T4TOTAL, FREET4, T3FREE, THYROIDAB in the last 72 hours. Anemia Panel: No results for input(s): VITAMINB12, FOLATE, FERRITIN, TIBC, IRON, RETICCTPCT in the last 72 hours. Urine analysis:    Component Value Date/Time   COLORURINE YELLOW 06/13/2024 0021   APPEARANCEUR HAZY (A) 06/13/2024 0021   LABSPEC 1.023 06/13/2024 0021   PHURINE 5.0 06/13/2024 0021   GLUCOSEU NEGATIVE 06/13/2024 0021   HGBUR NEGATIVE 06/13/2024 0021   BILIRUBINUR NEGATIVE 06/13/2024 0021   KETONESUR NEGATIVE 06/13/2024 0021   PROTEINUR NEGATIVE 06/13/2024 0021   UROBILINOGEN 0.2 02/21/2014 1629   NITRITE NEGATIVE 06/13/2024 0021   LEUKOCYTESUR MODERATE (A) 06/13/2024 0021   Sepsis Labs: @LABRCNTIP (procalcitonin:4,lacticidven:4) )No results found for this or any previous visit (from the past 240 hours).   Radiological Exams on Admission: DG Chest Port 1 View Result Date: 06/13/2024 EXAM: 1 VIEW(S) XRAY OF THE CHEST 06/13/2024 12:31:00 AM COMPARISON: Portable chest 02/24/2022. CLINICAL HISTORY: Questionable sepsis - evaluate for abnormality. FINDINGS: LINES, TUBES AND DEVICES: There is a tangle of overlying monitor wiring. LUNGS AND PLEURA: There is a low inspiration on exam. Limited view of the bases. No focal pneumonia seen in the visualized aerated lungs. No pleural effusion. No pneumothorax. HEART AND MEDIASTINUM: No acute abnormality of the cardiac and mediastinal silhouettes. BONES AND SOFT TISSUES: No acute osseous abnormality. IMPRESSION: 1. No acute cardiopulmonary process identified. 2. Evaluation is limited by low inspiratory effort. Basilar disease could be missed. Electronically signed by: Francis Quam MD 06/13/2024 12:45 AM EST RP Workstation: [id number]     Assessment/Plan  1. SIRS  - No acute findings CXR, abdominal exam benign, no meningismus, no apparent cellulitis, UA with rare bacteria  - Blood and urine  cultures were collected in ED   - Check  COVID, influenza, and RSV pcr, treat with broad-spectrum antibiotics for now, follow cultures and clinical course    2. Hx of ICH; dysphagia; hemiparesis  - Consult dietary for tube feeds, continue tube care, supportive care   3. Hypernatremia  - She was given LR bolus in ED, will continue IVF hydration with 0.45% NaCl and repeat chem panel in am    4. Hypertension  - Hold antihypertensives for now given concern for developing sepsis    DVT prophylaxis: Lovenox   Code Status: Full  Level of Care: Level of care: Progressive Family Communication: Husband updated from ED   Disposition Plan:  Patient is from:  SNF  Anticipated d/c is to: SNF  Anticipated d/c date is: 06/15/24 Patient currently: Pending cultures, clinical course  Consults called: None  Admission status: Inpatient     Evalene GORMAN Sprinkles, MD [facility]  06/13/2024, 2:21 AM

## 2024-06-14 DIAGNOSIS — R6510 Systemic inflammatory response syndrome (SIRS) of non-infectious origin without acute organ dysfunction: Secondary | ICD-10-CM

## 2024-06-14 LAB — PHOSPHORUS: Phosphorus: 2.7 mg/dL (ref 2.5–4.6)

## 2024-06-14 LAB — BASIC METABOLIC PANEL WITH GFR
Anion gap: 12 (ref 5–15)
BUN: 12 mg/dL (ref 6–20)
CO2: 24 mmol/L (ref 22–32)
Calcium: 8.5 mg/dL — ABNORMAL LOW (ref 8.9–10.3)
Chloride: 111 mmol/L (ref 98–111)
Creatinine, Ser: 0.63 mg/dL (ref 0.44–1.00)
GFR, Estimated: >60 mL/min (ref >60)
Glucose, Bld: 199 mg/dL — ABNORMAL HIGH (ref 70–99)
Potassium: 3.8 mmol/L (ref 3.5–5.1)
Sodium: 147 mmol/L — ABNORMAL HIGH (ref 135–145)

## 2024-06-14 LAB — URINE CULTURE: Culture: NO GROWTH

## 2024-06-14 LAB — CBC
HCT: 30.9 % — ABNORMAL LOW (ref 36.0–46.0)
Hemoglobin: 9.3 g/dL — ABNORMAL LOW (ref 12.0–15.0)
MCH: 24.5 pg — ABNORMAL LOW (ref 26.0–34.0)
MCHC: 30.1 g/dL (ref 30.0–36.0)
MCV: 81.5 fL (ref 80.0–100.0)
Platelets: 140 K/uL — ABNORMAL LOW (ref 150–400)
RBC: 3.79 MIL/uL — ABNORMAL LOW (ref 3.87–5.11)
RDW: 15.8 % — ABNORMAL HIGH (ref 11.5–15.5)
WBC: 7.9 K/uL (ref 4.0–10.5)
nRBC: 0.3 % — ABNORMAL HIGH (ref 0.0–0.2)

## 2024-06-14 LAB — MAGNESIUM: Magnesium: 2.1 mg/dL (ref 1.7–2.4)

## 2024-06-14 MED ORDER — DICLOFENAC SODIUM 1 % EX GEL
4.0000 g | Freq: Two times a day (BID) | CUTANEOUS | Status: DC | PRN
  Filled 2024-06-14: qty 100

## 2024-06-14 MED ORDER — LIDOCAINE-EPINEPHRINE 1 %-1:100000 IJ SOLN
INTRAMUSCULAR | Status: AC
  Filled 2024-06-14: qty 1

## 2024-06-14 MED ORDER — FLUCONAZOLE 40 MG/ML PO SUSR
100.0000 mg | Freq: Every day | ORAL | Status: DC
  Filled 2024-06-14: qty 2.5

## 2024-06-14 MED ORDER — FLUCONAZOLE 100 MG PO TABS
100.0000 mg | ORAL_TABLET | Freq: Every day | ORAL | Status: DC
  Administered 2024-06-14 – 2024-06-15 (×2): 100 mg
  Filled 2024-06-14 (×2): qty 1

## 2024-06-14 MED ORDER — OXYCODONE HCL 5 MG PO TABS
5.0000 mg | ORAL_TABLET | Freq: Four times a day (QID) | ORAL | Status: DC | PRN
  Administered 2024-06-14: 5 mg
  Filled 2024-06-14: qty 1

## 2024-06-14 NOTE — NC FL2 (Addendum)
 [place]  MEDICAID FL2 LEVEL OF CARE FORM     IDENTIFICATION  Patient Name: Charlene [name] Birthdate: [birth year] Sex: female Admission Date (Current Location): 06/12/2024  [facility] and [place] Number:  Producer, Television/film/video and Address:  [place], [address], [place], [place] [address]      Provider Number: [id number]  Attending Physician Name and Address:  Von Bellis, MD  Relative Name and Phone Number:  Juliene Blumenthal (spouse) [phone]    Current Level of Care: Hospital Recommended Level of Care: Skilled Nursing Facility Prior Approval Number:    Date Approved/Denied:   PASRR Number: [id number]  Discharge Plan: SNF    Current Diagnoses: Patient Active Problem List   Diagnosis Date Noted   SIRS (systemic inflammatory response syndrome) (HCC) 06/13/2024   Hypernatremia 06/13/2024   Acute metabolic encephalopathy 02/25/2022   Seizure (HCC) 02/24/2022   History of urinary retention-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  04/16/2021   Osteoarthritis of right knee 04/16/2021   HLD (hyperlipidemia) 04/16/2021   Superficial Thrombophlebitis of arm, right (RESOLVED) 04/16/2021   Severe Dental disease 04/16/2021   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    Pseudomonas urinary tract infection    Nonspecific abnormal electrocardiogram (ECG) (EKG)    Knee pain, right    Constipation    Ileus, unspecified (HCC)    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    Aspiration pneumonia of right lower lobe (HCC)    Poor prognosis 10/08/2020   Muscle hypertonicity    Oropharyngeal dysphagia    Palliative care by specialist    History of intracranial hemorrhage 08/27/2020   Anxiety state 03/11/2014   Essential hypertension, benign 02/28/2014    Orientation RESPIRATION BLADDER Height & Weight        Normal Incontinent, External catheter (External Urinary Catheter) Weight: 149 lb 7.6 oz (67.8 kg) Height:      BEHAVIORAL SYMPTOMS/MOOD NEUROLOGICAL Other-Unable to follow commands BOWEL NUTRITION STATUS      Incontinent DIET: Feeding Tube -Please see DC summary   AMBULATORY STATUS COMMUNICATION OF NEEDS Skin   Total Care Verbally Other (Comment) (Scratch Marks,Shoulder,L,Wound/Incision LDAs)                       Personal Care Assistance Level of Assistance  Bathing, Feeding, Dressing Bathing Assistance: Maximum assistance Feeding assistance: Maximum assistance Dressing Assistance: Maximum assistance     Functional Limitations Info  Speech          SPECIAL CARE FACTORS FREQUENCY                       Contractures Contractures Info: Not present    Additional Factors Info  Code Status, Allergies Code Status Info: FULL Allergies Info: NKA           Current Medications (06/14/2024):  This is the current hospital active medication list Current Facility-Administered Medications  Medication Dose Route Frequency Provider Last Rate Last Admin   acetaminophen  (TYLENOL ) tablet 650 mg  650 mg Per Tube Q6H PRN Opyd, Timothy S, MD       Or   acetaminophen  (TYLENOL ) suppository 650 mg  650 mg Rectal Q6H PRN Opyd, Timothy S, MD       aspirin  chewable tablet 81 mg  81 mg Per Tube Daily Opyd, Timothy S, MD   81 mg at 06/14/24 0930   atorvastatin  (LIPITOR) tablet 20 mg  20 mg Per Tube QHS Opyd,  Timothy S, MD   20 mg at 06/13/24 2136   baclofen  (LIORESAL ) tablet 5 mg  5 mg Per Tube BID Opyd, Timothy S, MD   5 mg at 06/14/24 0930   bisacodyl  (DULCOLAX) suppository 10 mg  10 mg Rectal Daily PRN Von Bellis, MD       ceFEPIme  (MAXIPIME ) 2 g in sodium chloride  0.9 % 100 mL IVPB  2 g Intravenous Q8H Jimenez, Aileen, [facility]   Paused at 06/14/24 9040   enoxaparin  (LOVENOX ) injection 30 mg  30 mg Subcutaneous Q24H Opyd, Timothy S, MD   30 mg at 06/14/24 0930   feeding supplement (JEVITY 1.5 CAL/FIBER) liquid 1,000 mL  1,000 mL Per Tube Continuous Kumar, Dileep, MD 50 mL/hr at 06/14/24 1000  Infusion Verify at 06/14/24 1000   free water  200 mL  200 mL Per Tube Q4H Von Bellis, MD   200 mL at 06/14/24 0755   metroNIDAZOLE  (FLAGYL ) IVPB 500 mg  500 mg Intravenous Q12H Opyd, Timothy S, MD 100 mL/hr at 06/14/24 1016 500 mg at 06/14/24 1016   omeprazole  (KONVOMEP ) 2 mg/mL oral suspension 20 mg  20 mg Per Tube Daily Opyd, Timothy S, MD   20 mg at 06/14/24 0930   polyethylene glycol (MIRALAX  / GLYCOLAX ) packet 17 g  17 g Per Tube BID Von Bellis, MD   17 g at 06/14/24 0930   prochlorperazine  (COMPAZINE ) injection 5 mg  5 mg Intravenous Q6H PRN Opyd, Timothy S, MD       senna-docusate (Senokot-S) tablet 1 tablet  1 tablet Per Tube QHS PRN Von Bellis, MD       sodium chloride  flush (NS) 0.9 % injection 3 mL  3 mL Intravenous Q12H Opyd, Timothy S, MD   3 mL at 06/14/24 0930   vancomycin  (VANCOREADY) IVPB 1500 mg/300 mL  1,500 mg Intravenous Q24H Jimenez, Aileen, RPH 150 mL/hr at 06/14/24 1000 Infusion Verify at 06/14/24 1000     Discharge Medications: Please see discharge summary for a list of discharge medications.  Relevant Imaging Results:  Relevant Lab Results:   Additional Information [id number]  Isaiah Public, LCSWA

## 2024-06-14 NOTE — Progress Notes (Signed)
 [facility] Progress Note  Patient: Charlene [name]    FMW:[id number]  DOA: 06/12/2024     Date of Service: the patient was seen and examined on 06/14/2024  Chief Complaint  Patient presents with   Code Sepsis   Brief hospital course: Charlene [name] is a 58 y.o. female with medical history significant for ICH with residual aphasia, dysphagia, and left hemiparesis, hypertension, and chronic anemia who presents for evaluation of hypoxia, tachycardia, tachypnea, and abnormal blood work.   Patient had labs drawn that were notable for sodium of 162 on 06/11/2024.  She was reportedly started on Augmentin  and 5% dextrose  in half-normal saline at her facility.   ED Course: Upon arrival to the ED, patient is found to be febrile to 38.1 C and saturating mid 90s on room air with tachypnea, tachycardia, and stable blood pressure.  Labs are most notable for sodium 158, normal creatinine, WBC 11,100, hemoglobin 9.4, platelets 145,000, and lactic acid 1.9.  There is rare bacteria and moderate leukocytes on urine dipstick, and 21-50 WBC/hpf on microscopy.  No acute findings noted on chest x-ray.   Blood and urine cultures were collected in the ED and the patient was given a liter of LR, vancomycin , cefepime , and acetaminophen .   Assessment and Plan:  # SIRS, no source of infection - No acute findings CXR, abdominal exam benign, no meningismus, no apparent cellulitis, UA with rare bacteria  - Blood culture NGTD and urine cultures were collected in ED   - Negative COVID, influenza, and RSV pcr Continue antibiotics for now, if patient remains afebrile today then we can switch to oral antibiotic for few days for possibility of aspiration pneumonia.  # Hx of ICH; dysphagia; hemiparesis  - Consult dietary for tube feeds, continue tube care, supportive care    # Hypernatremia  S/p 0.45 saline given on admission. Most likely due to dehydration Continue free water  via PEG tube Serum sodium 158>>  147 Sodium level is gradually improving Monitor BMP daily    # Hypertension  - Hold antihypertensives for now given concern for developing sepsis  11/28 still BP soft, patient is slightly tachycardic   # Constipation, started laxatives.  # Oral thrush, started Diflucan 100 mg daily for 2 weeks. Continue oral care.   Body mass index is 24.87 kg/m.  Nutrition Problem: Inadequate oral intake Etiology: dysphagia Interventions: Interventions: Tube feeding  Diet: PEG tube feeding DVT Prophylaxis: Subcutaneous Lovenox    Advance goals of care discussion: Full code  Family Communication: family was not present at bedside, at the time of rounding. Patient has aphasia and nonverbal due to h/o stroke 11/28 discussed with patient's daughter over the phone  Disposition:  [facility] is from [facility], admitted with SIRS, still  on IV abx and soft BP, Vitals not stable, which precludes a safe discharge. Discharge to [facility], when stable, most likely in 1 to 2 days.  Subjective: No significant events overnight.  Patient is nonverbal due to recent CVA, aphasic. Patient was resting comfortably, did not notice any acute discomfort.  Mild cough noticed due to difficulty handling oral secretions.  Physical Exam: General: NAD, lying comfortably Appear in no distress, affect appropriate Eyes: PERRLA ENT: Oral Mucosa Clear, moist  Neck: no JVD,  Cardiovascular: S1 and S2 Present, no Murmur,  Respiratory: good respiratory effort, Bilateral Air entry equal and Decreased, no Crackles, no wheezes Abdomen: BS positive, Soft and no tenderness,  Skin: no rashes Extremities: no Pedal edema, no calf tenderness Neurologic: Left hemiparesis  and contracted due to prior CVA.  Aphasia and dysphagia, status post PEG tube  Vitals:   06/14/24 0900 06/14/24 1000 06/14/24 1100 06/14/24 1229  BP: 91/68 110/74 103/71 103/74  Pulse:    (!) 110  Resp: (!) 26 (!) 24 (!) 29 (!) 28  Temp:    98.1 F (36.7 C)  TempSrc:     Axillary  SpO2: 96% 95% 95% 95%  Weight:        Intake/Output Summary (Last 24 hours) at 06/14/2024 1541 Last data filed at 06/14/2024 1526 Gross per 24 hour  Intake 3052.92 ml  Output 1700 ml  Net 1352.92 ml   Filed Weights   06/13/24 0900  Weight: 67.8 kg    Data Reviewed: I have personally reviewed and interpreted daily labs, tele strips, imagings as discussed above. I reviewed all nursing notes, pharmacy notes, vitals, pertinent old records I have discussed plan of care as described above with RN and patient/family.  CBC: Recent Labs  Lab 06/13/24 0021 06/13/24 0346 06/14/24 0451  WBC 11.1* 12.1* 7.9  NEUTROABS 8.0*  --   --   HGB 9.4* 9.0* 9.3*  HCT 32.1* 30.3* 30.9*  MCV 86.1 83.5 81.5  PLT 145* 169 140*   Basic Metabolic Panel: Recent Labs  Lab 06/13/24 0021 06/13/24 0207 06/14/24 0451  NA 158* 154* 147*  K 3.8 3.5 3.8  CL 120* 119* 111  CO2 25 25 24   GLUCOSE 153* 155* 199*  BUN 25* 23* 12  CREATININE 0.71 0.63 0.63  CALCIUM  8.3* 8.0* 8.5*  MG  --  2.3 2.1  PHOS  --  2.9 2.7    Studies: No results found.  Scheduled Meds:  aspirin   81 mg Per Tube Daily   atorvastatin   20 mg Per Tube QHS   baclofen   5 mg Per Tube BID   enoxaparin  (LOVENOX ) injection  30 mg Subcutaneous Q24H   fluconazole  100 mg Per Tube Daily   free water   200 mL Per Tube Q4H   omeprazole   20 mg Per Tube Daily   polyethylene glycol  17 g Per Tube BID   sodium chloride  flush  3 mL Intravenous Q12H   Continuous Infusions:  ceFEPime  (MAXIPIME ) IV Stopped (06/14/24 0959)   feeding supplement (JEVITY 1.5 CAL/FIBER) 50 mL/hr at 06/14/24 1100   metronidazole  100 mL/hr at 06/14/24 1100   vancomycin  150 mL/hr at 06/14/24 1100   PRN Meds: acetaminophen  **OR** acetaminophen , bisacodyl , oxyCODONE , prochlorperazine , [COMPLETED] senna-docusate **FOLLOWED BY** senna-docusate  Time spent: 55 minutes  Author: ELVAN [name]. MD [facility] 06/14/2024 3:41 PM  To reach On-call,  see care teams to locate the attending and reach out to them via [email or web address]. If 7PM-7AM, please contact night-coverage If you still have difficulty reaching the attending provider, please page the [facility] (Director on Call) for [facility] on amion for assistance.

## 2024-06-14 NOTE — TOC Initial Note (Signed)
 Transition of Care [facility]) - Initial/Assessment Note    Patient Details  Name: Charlene [name] MRN: [id number] Date of Birth: 04/13/1966  [facility] of [facility]) CM/SW Contact:    Isaiah Public, LCSWA Phone Number: 06/14/2024, 10:57 AM  Clinical Narrative:                  Due to patients current orientation, CSW spoke with patients spouse Juliene. Juliene confirmed patient comes from [facility]. [name] confirmed plan is for patient to return back to [facility] when patient is medically ready for dc. CSW spoke with Leontine with [facility] who confirmed patient can return to facility today if medically ready. All questions answered. No further questions reported at this time.  Expected Discharge Plan: Skilled Nursing Facility Barriers to Discharge: Continued Medical Work up   Patient Goals and CMS Choice            Expected Discharge Plan and Services In-house Referral: Clinical Social Work     Living arrangements for the past 2 months: Skilled Nursing Facility                                      Prior Living Arrangements/Services Living arrangements for the past 2 months: Skilled Nursing Facility Lives with:: Facility Resident Patient language and need for interpreter reviewed:: Yes        Need for Family Participation in Patient Care: Yes (Comment) Care giver support system in place?: Yes (comment)   Criminal Activity/Legal Involvement Pertinent to Current Situation/Hospitalization: No - Comment as needed  Activities of Daily Living      Permission Sought/Granted Permission sought to share information with : Case Manager, Magazine Features Editor, Family Supports                Emotional Assessment       Orientation: :  (unable to follow commands) Alcohol / Substance Use: Not Applicable Psych Involvement: No (comment)  Admission diagnosis:  Hypernatremia [E87.0] SIRS (systemic inflammatory response syndrome) (HCC) [R65.10] Acute cystitis  without hematuria [N30.00] Patient Active Problem List   Diagnosis Date Noted   SIRS (systemic inflammatory response syndrome) (HCC) 06/13/2024   Hypernatremia 06/13/2024   Acute metabolic encephalopathy 02/25/2022   Seizure (HCC) 02/24/2022   History of urinary retention-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  04/16/2021   Osteoarthritis of right knee 04/16/2021   HLD (hyperlipidemia) 04/16/2021   Superficial Thrombophlebitis of arm, right (RESOLVED) 04/16/2021   Severe Dental disease 04/16/2021   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    Pseudomonas urinary tract infection    Nonspecific abnormal electrocardiogram (ECG) (EKG)    Knee pain, right    Constipation    Ileus, unspecified (HCC)    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 [place] [address] Phone: [phone] Fax: [phone]     Social Drivers of Health (SDOH) Social History: SDOH Screenings   Food Insecurity: Patient Unable To Answer (06/13/2024)  Housing: Patient Unable To Answer (06/13/2024)  Transportation  Needs: Patient Unable To Answer (06/13/2024)  Utilities: Patient Unable To Answer (06/13/2024)  Tobacco Use: Low Risk  (06/13/2024)   SDOH Interventions:     Readmission Risk Interventions     No data to display

## 2024-06-14 NOTE — Plan of Care (Signed)
  Problem: Fluid Volume: Goal: Hemodynamic stability will improve Outcome: Progressing   Problem: Clinical Measurements: Goal: Diagnostic test results will improve Outcome: Progressing Goal: Signs and symptoms of infection will decrease Outcome: Progressing   Problem: Respiratory: Goal: Ability to maintain adequate ventilation will improve Outcome: Progressing   Problem: Education: Goal: Knowledge of General Education information will improve Description: Including pain rating scale, medication(s)/side effects and non-pharmacologic comfort measures Outcome: Progressing   Problem: Health Behavior/Discharge Planning: Goal: Ability to manage health-related needs will improve Outcome: Progressing   Problem: Clinical Measurements: Goal: Ability to maintain clinical measurements within normal limits will improve Outcome: Progressing Goal: Will remain free from infection Outcome: Progressing Goal: Diagnostic test results will improve Outcome: Progressing Goal: Respiratory complications will improve Outcome: Progressing Goal: Cardiovascular complication will be avoided Outcome: Progressing   Problem: Activity: Goal: Risk for activity intolerance will decrease Outcome: Progressing   Problem: Nutrition: Goal: Adequate nutrition will be maintained Outcome: Progressing   Problem: Coping: Goal: Level of anxiety will decrease Outcome: Progressing   Problem: Elimination: Goal: Will not experience complications related to bowel motility Outcome: Progressing Goal: Will not experience complications related to urinary retention Outcome: Progressing   Problem: Pain Managment: Goal: General experience of comfort will improve and/or be controlled Outcome: Progressing   Problem: Safety: Goal: Ability to remain free from injury will improve Outcome: Progressing   Problem: Skin Integrity: Goal: Risk for impaired skin integrity will decrease Outcome: Progressing   Problem:  Fluid Volume: Goal: Hemodynamic stability will improve Outcome: Progressing   Problem: Fluid Volume: Goal: Hemodynamic stability will improve Outcome: Progressing   Problem: Clinical Measurements: Goal: Diagnostic test results will improve Outcome: Progressing Goal: Signs and symptoms of infection will decrease Outcome: Progressing   Problem: Respiratory: Goal: Ability to maintain adequate ventilation will improve Outcome: Progressing   Problem: Education: Goal: Knowledge of General Education information will improve Description: Including pain rating scale, medication(s)/side effects and non-pharmacologic comfort measures Outcome: Progressing   Problem: Health Behavior/Discharge Planning: Goal: Ability to manage health-related needs will improve Outcome: Progressing   Problem: Clinical Measurements: Goal: Ability to maintain clinical measurements within normal limits will improve Outcome: Progressing Goal: Will remain free from infection Outcome: Progressing Goal: Diagnostic test results will improve Outcome: Progressing Goal: Respiratory complications will improve Outcome: Progressing Goal: Cardiovascular complication will be avoided Outcome: Progressing   Problem: Activity: Goal: Risk for activity intolerance will decrease Outcome: Progressing   Problem: Nutrition: Goal: Adequate nutrition will be maintained Outcome: Progressing   Problem: Coping: Goal: Level of anxiety will decrease Outcome: Progressing   Problem: Elimination: Goal: Will not experience complications related to bowel motility Outcome: Progressing Goal: Will not experience complications related to urinary retention Outcome: Progressing   Problem: Pain Managment: Goal: General experience of comfort will improve and/or be controlled Outcome: Progressing   Problem: Safety: Goal: Ability to remain free from injury will improve Outcome: Progressing   Problem: Skin Integrity: Goal: Risk  for impaired skin integrity will decrease Outcome: Progressing

## 2024-06-14 NOTE — Evaluation (Addendum)
 Physical Therapy Brief Evaluation and Discharge Note Patient Details Name: Charlene [name] MRN: [id number] DOB: 11/01/1965 Today's Date: 06/14/2024   History of Present Illness  58 yo female presents 11/26 with hypoxia tachycardia and abnormal labs. PMH ICH  (2015) with residual aphasia, dysphagia, L hemiparesis, HTN, chronic anemia,  Clinical Impression  Though pt came to the hospital with above problems, her basic care needs and her inability to assist with any of her care has not changes.  3 extremities are contractured and dysfunctional, neck is rotated and contractured, gaze is locked R, pt appears dependent in all care.  I'm sorry to say that we have nothing to offer this patient at this time and will defer care back to her SNF residence.       PT Assessment    Assistance Needed at Discharge       Equipment Recommendations [facility] lift;Hospital bed  Recommendations for Other Services       Precautions/Restrictions Precautions Precautions: Fall Recall of Precautions/Restrictions: Impaired Required Braces or Orthoses:  (positioning boots  soft for pressure, less than optimal positioning.)        Mobility  Bed Mobility Rolling: Total assist        Transfers Overall transfer level:  (deferred)                      Ambulation/Gait Ambulation/Gait assistance:  (deferred)            Home Activity Instructions    Stairs            Modified Rankin (Stroke Patients Only)        Balance                          Pertinent Vitals/Pain   Pain Assessment Pain Assessment: Faces Faces Pain Scale: Hurts little more Pain Location: general with PROM of contracture Pain Intervention(s): Limited activity within patient's tolerance     Home Living                    Prior Function        UE/LE Assessment               Communication   Communication Communication: Impaired Factors Affecting Communication: Other  (comment) (non verbal)     Cognition         General Comments General comments (skin integrity, edema, etc.): HR jumped from 90's to 108 with assistance of moving.  pt moaning with any prolonged PROM    Exercises Other Exercises Other Exercises: Attmpted PROM of bil UE's and LE's at each joint with prolonged extension.   Assessment/Plan    PT Problem List         PT Visit Diagnosis Adult, failure to thrive (R62.7);Other symptoms and signs involving the nervous system (R29.898);Hemiplegia and hemiparesis;Other (comment) (severe contracture)    No Skilled PT Other (comment) (SNF staff and family can assist patient furth.dsa)   Co-evaluation                AMPAC 6 Clicks Help needed turning from your back to your side while in a flat bed without using bedrails?: Total Help needed moving from lying on your back to sitting on the side of a flat bed without using bedrails?: Total Help needed moving to and from a bed to a chair (including a wheelchair)?: Total Help needed standing up from a chair using your arms (e.g.,  wheelchair or bedside chair)?: Total Help needed to walk in hospital room?: Total Help needed climbing 3-5 steps with a railing? : Total 6 Click Score: 6      End of Session Equipment Utilized During Treatment: Oxygen Activity Tolerance: Patient tolerated treatment well;Patient limited by pain Patient left: in bed Nurse Communication: Mobility status;Other (comment) (discussed that we had little to offer her at this time in the acute venue) PT Visit Diagnosis: Adult, failure to thrive (R62.7);Other symptoms and signs involving the nervous system (R29.898);Hemiplegia and hemiparesis;Other (comment) (severe contracture)     Time: 8594-8579 PT Time Calculation (min) (ACUTE ONLY): 15 min  Charges:   PT Evaluation $PT Eval High Complexity: 1 High      06/14/2024  India HERO., PT Acute Rehabilitation Services [phone]  (office)  Vinie GAILS  Jamiee Milholland  06/14/2024, 3:41 PM

## 2024-06-15 DIAGNOSIS — R6510 Systemic inflammatory response syndrome (SIRS) of non-infectious origin without acute organ dysfunction: Secondary | ICD-10-CM | POA: Diagnosis not present

## 2024-06-15 LAB — BASIC METABOLIC PANEL WITH GFR
Anion gap: 12 (ref 5–15)
BUN: 11 mg/dL (ref 6–20)
CO2: 25 mmol/L (ref 22–32)
Calcium: 8.3 mg/dL — ABNORMAL LOW (ref 8.9–10.3)
Chloride: 109 mmol/L (ref 98–111)
Creatinine, Ser: 0.46 mg/dL (ref 0.44–1.00)
GFR, Estimated: >60 mL/min (ref >60)
Glucose, Bld: 162 mg/dL — ABNORMAL HIGH (ref 70–99)
Potassium: 3.5 mmol/L (ref 3.5–5.1)
Sodium: 146 mmol/L — ABNORMAL HIGH (ref 135–145)

## 2024-06-15 LAB — CBC
HCT: 31.3 % — ABNORMAL LOW (ref 36.0–46.0)
Hemoglobin: 9.5 g/dL — ABNORMAL LOW (ref 12.0–15.0)
MCH: 24.7 pg — ABNORMAL LOW (ref 26.0–34.0)
MCHC: 30.4 g/dL (ref 30.0–36.0)
MCV: 81.5 fL (ref 80.0–100.0)
Platelets: 150 K/uL (ref 150–400)
RBC: 3.84 MIL/uL — ABNORMAL LOW (ref 3.87–5.11)
RDW: 15.9 % — ABNORMAL HIGH (ref 11.5–15.5)
WBC: 5.1 K/uL (ref 4.0–10.5)
nRBC: 0.6 % — ABNORMAL HIGH (ref 0.0–0.2)

## 2024-06-15 LAB — PHOSPHORUS: Phosphorus: 2.7 mg/dL (ref 2.5–4.6)

## 2024-06-15 LAB — MAGNESIUM: Magnesium: 2.3 mg/dL (ref 1.7–2.4)

## 2024-06-15 MED ORDER — BISACODYL 10 MG RE SUPP: 10.0000 mg | Freq: Every day | RECTAL | Status: AC | PRN

## 2024-06-15 MED ORDER — POLYETHYLENE GLYCOL 3350 17 G PO PACK: 17.0000 g | PACK | Freq: Two times a day (BID) | ORAL | Status: DC

## 2024-06-15 MED ORDER — FLUCONAZOLE 100 MG PO TABS: 100.0000 mg | ORAL_TABLET | Freq: Every day | ORAL | Status: AC

## 2024-06-15 MED ORDER — SENNOSIDES-DOCUSATE SODIUM 8.6-50 MG PO TABS: ORAL_TABLET | ORAL | Status: AC

## 2024-06-15 NOTE — Discharge Summary (Signed)
 [facility] Discharge Summary   Patient: GARNETTE GREB FMW:[id number]  PCP: [facility]  Date of admission: 06/12/2024   Date of discharge:  06/15/2024     Discharge Diagnoses:  Principal Problem:   SIRS (systemic inflammatory response syndrome) (HCC) Active Problems:   Essential hypertension, benign   History of intracranial hemorrhage   HLD (hyperlipidemia)   Hypernatremia   Admitted From: SNF Disposition:  SNF   Recommendations for Outpatient Follow-up:  Follow-up with PCP, patient should be seen by an MD in 1 to 2 days.  Continue Augmentin  for 5 more days to complete 7-day course. Monitor vitals and titrate medications accordingly.  Held amlodipine  for now, resume when patient becomes hypertensive, discontinued Cardura  for now due to low BP. Continue hydration, use free water  via PEG tube for hypernatremia. Repeat BMP in 1 week to check sodium level. Started laxatives due to constipation found on x-ray. Started Diflucan due to oral thrush and possible esophagitis.  Continue for total 2 weeks.  Continue oral care. Continue aspiration precautions and turn every 2 hourly. Follow up LABS/TEST:  as above   Diet recommendation: PEG tube feeding, with free water  to prevent dehydration and hyponatremia  Activity: The patient is advised to gradually reintroduce usual activities, as tolerated  Discharge Condition: stable  Code Status: Full code   History of present illness: As per the H and P dictated on admission.  Hospital Course:  NETHRA MEHLBERG is a 58 y.o. female with medical history significant for ICH with residual aphasia, dysphagia, and left hemiparesis, hypertension, and chronic anemia who presents for evaluation of hypoxia, tachycardia, tachypnea, and abnormal blood work.   Patient had labs drawn that were notable for sodium of 162 on 06/11/2024.  She was reportedly started on Augmentin  and 5% dextrose  in half-normal saline at her facility.   ED Course: Upon  arrival to the ED, patient is found to be febrile to 38.1 C and saturating mid 90s on room air with tachypnea, tachycardia, and stable blood pressure.  Labs are most notable for sodium 158, normal creatinine, WBC 11,100, hemoglobin 9.4, platelets 145,000, and lactic acid 1.9.  There is rare bacteria and moderate leukocytes on urine dipstick, and 21-50 WBC/hpf on microscopy.  No acute findings noted on chest x-ray.   Blood and urine cultures were collected in the ED and the patient was given a liter of LR, vancomycin , cefepime , and acetaminophen .     Assessment and Plan:   # SIRS, no source of infection - No acute findings CXR, abdominal exam benign, no meningismus, no apparent cellulitis, UA with rare bacteria  - Blood culture NGTD and urine cultures no growth - Negative COVID, influenza, and RSV pcr S/p antibiotics patient was on cefepime , vancomycin  and Flagyl  during hospital stay.  Remained afebrile and no signs of infection.  So discontinued antibiotics, resumed Augmentin  to complete the course for 5 more days.  # Hx of ICH; dysphagia; hemiparesis  - Consulted dietary for tube feeds, continue tube care, and supportive care.  Continue PEG tube feeding with free water  to prevent dehydration and hyponatremia.   # Hypernatremia  S/p 0.45 saline given on admission. Most likely due to dehydration Continue free water  via PEG tube Serum sodium 158>> 147>146 Sodium level is gradually improving Repeat BMP in 1 week     # Hypertension, presented with low blood pressure - Held antihypertensive medications during hospital stay.  Discontinued Cardura , BP is still low.  Held amlodipine , can be resumed when systolic BP greater  than 140 mmHg. Monitor BP and titrate medications accordingly.  # Constipation, started laxatives.   # Oral thrush, started Diflucan 100 mg daily for 2 weeks. Continue oral care.   Body mass index is 24.87 kg/m.  Nutrition Problem: Inadequate oral intake Etiology:  dysphagia Nutrition Interventions: Interventions: Tube feeding   On the day of the discharge the patient's vitals were stable, and no other acute medical condition were reported by patient. the patient was felt safe to be discharge at [facility].  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  Aphasic and dysphagia, rightward gaze.  No new neurological findings.  Filed Weights   06/13/24 0900  Weight: 67.8 kg   Vitals:   06/14/24 2336 06/15/24 0810  BP: 92/65 101/68  Pulse: 95 97  Resp: 15 19  Temp: 98 F (36.7 C) 99.4 F (37.4 C)  SpO2: 95% 96%    DISCHARGE MEDICATION: Allergies as of 06/15/2024   No Known Allergies      Medication List     PAUSE taking these medications    amLODipine  10 MG tablet Wait to take this until your doctor or other care provider tells you to start again. Commonly known as: NORVASC  Place 1 tablet (10 mg total) into feeding tube daily.       STOP taking these medications    doxazosin  2 MG tablet Commonly known as: CARDURA        TAKE these medications    acetaminophen  500 MG tablet Commonly known as: TYLENOL  Place 1,000 mg into feeding tube See admin instructions. Give 2 tablets (1000mg ) via tube twice a day for pain. Also, give 1 tablet (500mg ) every 6 hours as needed for fever or pain. What changed: Another medication with the same name was removed. Continue taking this medication, and follow the directions you see here.   amoxicillin -clavulanate 500-125 MG tablet Commonly known as: AUGMENTIN  Place 1 tablet into feeding tube 3 (three) times daily. 7 day course.    aspirin  81 MG chewable tablet Place 81 mg into feeding tube daily.   atorvastatin  20 MG tablet Commonly known as: LIPITOR Place 20 mg into feeding tube at bedtime.   Baclofen  5 MG Tabs Place 5 mg into feeding tube 2 (two) times daily.   bisacodyl  10 MG suppository Commonly known as: DULCOLAX Place 1 suppository (10 mg total) rectally daily as needed for severe constipation.   chlorhexidine  0.12 % solution Commonly known as: PERIDEX  Use as directed 15 mLs in the mouth or throat 2 (two) times daily.   dextrose  5 % and 0.45 % NaCl 5-0.45 % infusion Inject 50 mL/hr into the vein continuous. Use 1 liter intravenously one time a day for hypernatremia/dehydration for 2 days. Run at 55ml/hr for a total of 2L.   diclofenac  Sodium 1 % Gel Commonly known as: VOLTAREN  Apply 4 g topically 4 (four) times daily. What changed:  when to take this reasons to take this   docusate 50 MG/5ML liquid Commonly known as: COLACE Place 100 mg into feeding tube every other day.   esomeprazole 20 MG capsule Commonly known as: NEXIUM 20 mg every other day. Per tube   ferrous sulfate 220 (44 Fe)  MG/5ML solution Place 7.5 mLs into feeding tube daily.   fluconazole 100 MG tablet Commonly known as: DIFLUCAN Place 1 tablet (100 mg total) into feeding tube daily for 14 days. Start taking on: June 16, 2024   fluticasone  50 MCG/ACT nasal spray Commonly known as: FLONASE  Place 2 sprays into both nostrils daily. What changed:  when to take this reasons to take this   lidocaine  4 % Place 1 patch onto the skin every evening. Apply to right knee.   polyethylene glycol 17 g packet Commonly known as: MIRALAX  / GLYCOLAX  Place 17 g into feeding tube 2 (two) times daily.   saccharomyces boulardii 250 MG capsule Commonly known as: FLORASTOR Place 250 mg into feeding tube in the morning and at bedtime.   senna-docusate 8.6-50 MG tablet Commonly known as: Senokot-S Place 1 tablet into feeding tube  at bedtime for 3 days, THEN 1 tablet at bedtime as needed for up to 3 days for mild constipation. Start taking on: June 15, 2024   sodium chloride  0.65 % Soln nasal spray Commonly known as: OCEAN Place 2 sprays into both nostrils in the morning and at bedtime.   Vitamin D -3 5000 UNIT/ML Liqd Give 5,000 Units by tube every Monday.       No Known Allergies Discharge Instructions     Call MD for:  difficulty breathing, headache or visual disturbances   Complete by: As directed    Call MD for:  extreme fatigue   Complete by: As directed    Call MD for:  persistant dizziness or light-headedness   Complete by: As directed    Call MD for:  persistant nausea and vomiting   Complete by: As directed    Call MD for:  redness, tenderness, or signs of infection (pain, swelling, redness, odor or green/yellow discharge around incision site)   Complete by: As directed    Call MD for:  severe uncontrolled pain   Complete by: As directed    Call MD for:  temperature >100.4   Complete by: As directed    Discharge instructions   Complete by: As directed    Follow-up with PCP, patient should be seen by an MD in 1 to 2 days.  Continue Augmentin  for 5 more days to complete 7-day course. Monitor vitals and titrate medications accordingly.  Held amlodipine  for now, resume when patient becomes hypertensive, discontinued Cardura  for now due to low BP. Continue hydration, use free water  via PEG tube for hypernatremia. Repeat BMP in 1 week to check sodium level. Started laxatives due to constipation found on x-ray. Started Diflucan due to oral thrush and possible esophagitis.  Continue for total 2 weeks.  Continue oral care. Continue aspiration precautions and turn every 2 hourly.   Increase activity slowly   Complete by: As directed        The results of significant diagnostics from this hospitalization (including imaging, microbiology, ancillary and laboratory) are listed below for reference.     Significant Diagnostic Studies: DG Abd 1 View Result Date: 06/13/2024 CLINICAL DATA:  Follow-up from small-bowel obstruction. EXAM: ABDOMEN - 1 VIEW COMPARISON:  05/17/2024 and prior studies.  CT, 04/15/2022. FINDINGS: Normal bowel gas pattern. Specifically, no bowel dilation to suggest obstruction. Mild to moderate increase in the colonic stool burden. Gastrostomy tube overlies the central upper abdomen. No acute skeletal abnormality. IMPRESSION: 1. No acute findings. No radiographic evidence of bowel obstruction. 2. Mild to moderate increase in the colonic stool burden. Electronically Signed   By:  Alm Parkins M.D.   On: 06/13/2024 09:25   DG Chest Port 1 View Result Date: 06/13/2024 EXAM: 1 VIEW(S) XRAY OF THE CHEST 06/13/2024 12:31:00 AM COMPARISON: Portable chest 02/24/2022. CLINICAL HISTORY: Questionable sepsis - evaluate for abnormality. FINDINGS: LINES, TUBES AND DEVICES: There is a tangle of overlying monitor wiring. LUNGS AND PLEURA: There is a low inspiration on exam. Limited view of the bases. No focal pneumonia seen in the visualized aerated lungs. No pleural effusion. No pneumothorax. HEART AND MEDIASTINUM: No acute abnormality of the cardiac and mediastinal silhouettes. BONES AND SOFT TISSUES: No acute osseous abnormality. IMPRESSION: 1. No acute cardiopulmonary process identified. 2. Evaluation is limited by low inspiratory effort. Basilar disease could be missed. Electronically signed by: Francis Quam MD 06/13/2024 12:45 AM EST RP Workstation: [id number]   DG ABDOMEN PEG TUBE LOCATION Result Date: 05/17/2024 CLINICAL DATA:  [id number] Dislodged gastrostomy tube [id number] EXAM: ABDOMEN - 1 VIEW COMPARISON:  None Available. FINDINGS: After the hand injection of enteric contrast, there is opacification of the stomach rugae with normal flow of contrast into the duodenum. Nonobstructive bowel gas pattern. Multilevel thoracolumbar osteophytosis. IMPRESSION: Percutaneous gastrostomy tube in place  with the tip in the stomach. Electronically Signed   By: Rogelia Myers M.D.   On: 05/17/2024 12:03    Microbiology: Recent Results (from the past 240 hours)  Blood Culture (routine x 2)     Status: None (Preliminary result)   Collection Time: 06/13/24 12:21 AM   Specimen: BLOOD RIGHT FOREARM  Result Value Ref Range Status   Specimen Description BLOOD RIGHT FOREARM  Final   Special Requests   Final    BOTTLES DRAWN AEROBIC AND ANAEROBIC Blood Culture adequate volume   Culture   Final    NO GROWTH 2 DAYS Performed at [facility], [address], [place], [place] [address]    Report Status PENDING  Incomplete  Blood Culture (routine x 2)     Status: None (Preliminary result)   Collection Time: 06/13/24 12:21 AM   Specimen: BLOOD  Result Value Ref Range Status   Specimen Description BLOOD LEFT ANTECUBITAL  Final   Special Requests   Final    BOTTLES DRAWN AEROBIC AND ANAEROBIC Blood Culture adequate volume   Culture   Final    NO GROWTH 2 DAYS Performed at [facility], [address] [place], [place] [address]    Report Status PENDING  Incomplete  Urine Culture     Status: None   Collection Time: 06/13/24 12:21 AM   Specimen: Urine, Random  Result Value Ref Range Status   Specimen Description URINE, RANDOM  Final   Special Requests NONE Reflexed from [id number]  Final   Culture   Final    NO GROWTH Performed at [facility], [address], [place], [place] [address]    Report Status 06/14/2024 FINAL  Final  Resp panel by RT-PCR (RSV, Flu A&B, Covid) Anterior Nasal Swab     Status: None   Collection Time: 06/13/24  2:07 AM   Specimen: Anterior Nasal Swab  Result Value Ref Range Status   SARS Coronavirus 2 by RT PCR NEGATIVE NEGATIVE Final   Influenza A by PCR NEGATIVE NEGATIVE Final   Influenza B by PCR NEGATIVE NEGATIVE Final    Comment: (NOTE) The Xpert Xpress SARS-CoV-2/FLU/RSV plus assay is intended as an aid in the diagnosis of influenza from  Nasopharyngeal swab specimens and should not be used as a sole basis for  treatment. Nasal washings and aspirates are unacceptable for Xpert Xpress SARS-CoV-2/FLU/RSV testing.  Fact Sheet for Patients: bloggercourse.com  Fact Sheet for Healthcare Providers: seriousbroker.it  This test is not yet approved or cleared by the [place]  FDA and has been authorized for detection and/or diagnosis of SARS-CoV-2 by FDA under an Emergency Use Authorization (EUA). This EUA will remain in effect (meaning this test can be used) for the duration of the COVID-19 declaration under Section 564(b)(1) of the Act, 21 U.S.C. section 360bbb-3(b)(1), unless the authorization is terminated or revoked.     Resp Syncytial Virus by PCR NEGATIVE NEGATIVE Final    Comment: (NOTE) Fact Sheet for Patients: bloggercourse.com  Fact Sheet for Healthcare Providers: seriousbroker.it  This test is not yet approved or cleared by the [place]  FDA and has been authorized for detection and/or diagnosis of SARS-CoV-2 by FDA under an Emergency Use Authorization (EUA). This EUA will remain in effect (meaning this test can be used) for the duration of the COVID-19 declaration under Section 564(b)(1) of the Act, 21 U.S.C. section 360bbb-3(b)(1), unless the authorization is terminated or revoked.  Performed at [facility], [address], [place], [place] [address]   Respiratory (~20 pathogens) panel by PCR     Status: None   Collection Time: 06/13/24  5:11 AM   Specimen: Nasopharyngeal Swab; Respiratory  Result Value Ref Range Status   Adenovirus NOT DETECTED NOT DETECTED Final   Coronavirus 229E NOT DETECTED NOT DETECTED Final    Comment: (NOTE) The Coronavirus on the Respiratory Panel, DOES NOT test for the novel  Coronavirus (2019 nCoV)    Coronavirus HKU1 NOT DETECTED NOT DETECTED Final    Coronavirus NL63 NOT DETECTED NOT DETECTED Final   Coronavirus OC43 NOT DETECTED NOT DETECTED Final   Metapneumovirus NOT DETECTED NOT DETECTED Final   Rhinovirus / Enterovirus NOT DETECTED NOT DETECTED Final   Influenza A NOT DETECTED NOT DETECTED Final   Influenza B NOT DETECTED NOT DETECTED Final   Parainfluenza Virus 1 NOT DETECTED NOT DETECTED Final   Parainfluenza Virus 2 NOT DETECTED NOT DETECTED Final   Parainfluenza Virus 3 NOT DETECTED NOT DETECTED Final   Parainfluenza Virus 4 NOT DETECTED NOT DETECTED Final   Respiratory Syncytial Virus NOT DETECTED NOT DETECTED Final   Bordetella pertussis NOT DETECTED NOT DETECTED Final   Bordetella Parapertussis NOT DETECTED NOT DETECTED Final   Chlamydophila pneumoniae NOT DETECTED NOT DETECTED Final   Mycoplasma pneumoniae NOT DETECTED NOT DETECTED Final    Comment: Performed at [facility], [address] [place], [place] [address]     Labs: CBC: Recent Labs  Lab 06/13/24 0021 06/13/24 0346 06/14/24 0451 06/15/24 0409  WBC 11.1* 12.1* 7.9 5.1  NEUTROABS 8.0*  --   --   --   HGB 9.4* 9.0* 9.3* 9.5*  HCT 32.1* 30.3* 30.9* 31.3*  MCV 86.1 83.5 81.5 81.5  PLT 145* 169 140* 150   Basic Metabolic Panel: Recent Labs  Lab 06/13/24 0021 06/13/24 0207 06/14/24 0451 06/15/24 0409  NA 158* 154* 147* 146*  K 3.8 3.5 3.8 3.5  CL 120* 119* 111 109  CO2 25 25 24 25   GLUCOSE 153* 155* 199* 162*  BUN 25* 23* 12 11  CREATININE 0.71 0.63 0.63 0.46  CALCIUM  8.3* 8.0* 8.5* 8.3*  MG  --  2.3 2.1 2.3  PHOS  --  2.9 2.7 2.7   Liver Function Tests: Recent Labs  Lab 06/13/24 0021  AST 21  ALT 15  ALKPHOS 60  BILITOT 0.5  PROT 6.5  ALBUMIN 2.5*   No results for input(s): LIPASE, AMYLASE in the last 168 hours. No results for input(s): AMMONIA in the last 168 hours. Cardiac Enzymes: No results for input(s): CKTOTAL, CKMB, CKMBINDEX, TROPONINI in the last 168 hours. BNP (last 3 results) No results for  input(s): BNP in the last 8760 hours. CBG: Recent Labs  Lab 06/13/24 0804 06/13/24 1216  GLUCAP 131* 143*    Time spent: 35 minutes  Signed:  Elvan Sor  [facility] 06/15/2024 10:58 AM

## 2024-06-15 NOTE — Progress Notes (Signed)
 Called [phone] and report given to [facility] Nurse Supervisor. Questions answered and no concerns expressed. Awaiting [facility] to transfer out. No family at the bedside at his time.

## 2024-06-15 NOTE — TOC Transition Note (Signed)
 Transition of Care [facility]) - Discharge Note   Patient Details  Name: Charlene [name] MRN: [id number] Date of Birth: 10-24-1965  [facility] CM/SW Contact:  Isaiah Public, LCSWA Phone Number: 06/15/2024, 11:54 AM   Clinical Narrative:     Patient will DC to: [facility] SNF   Anticipated DC date: 06/15/2024  Family notified: [name]   Transport by: [facility]   ?  Per MD patient ready for DC to [facility] . RN, patient, patient's family, and facility notified of DC. Discharge Summary sent to facility. RN given number for report [phone] [id number]. DC packet on chart. Ambulance transport requested for patient.  CSW signing off.   Final next level of care: Skilled Nursing Facility Barriers to Discharge: No Barriers Identified   Patient Goals and CMS Choice Patient states their goals for this hospitalization and ongoing recovery are:: SNF   Choice offered to / list presented to : Spouse      Discharge Placement              Patient chooses bed at: [facility] Patient to be transferred to facility by: [facility] Name of family member notified: [name] Patient and family notified of of transfer: 06/15/24  Discharge Plan and Services Additional resources added to the After Visit Summary for   In-house Referral: Clinical Social Work                                   Social Drivers of Health (SDOH) Interventions SDOH Screenings   Food Insecurity: Patient Unable To Answer (06/13/2024)  Housing: Patient Unable To Answer (06/13/2024)  Transportation Needs: Patient Unable To Answer (06/13/2024)  Utilities: Patient Unable To Answer (06/13/2024)  Tobacco Use: Low Risk  (06/13/2024)     Readmission Risk Interventions     No data to display

## 2024-06-15 NOTE — TOC Progression Note (Signed)
 Transition of Care [facility]) - Progression Note    Patient Details  Name: Charlene [name] MRN: [id number] Date of Birth: 1966/01/08  Transition [facility] Care [facility]) CM/SW Contact  Isaiah Public, LCSWA Phone Number: 06/15/2024, 11:54 AM  Clinical Narrative:     Leontine with [facility] confirmed facility can accept patient back today if medically ready.  Expected Discharge Plan: Skilled Nursing Facility Barriers to Discharge: No Barriers Identified               Expected Discharge Plan and Services In-house Referral: Clinical Social Work     Living arrangements for the past 2 months: Skilled Nursing Facility Expected Discharge Date: 06/15/24                                     Social Drivers of Health (SDOH) Interventions SDOH Screenings   Food Insecurity: Patient Unable To Answer (06/13/2024)  Housing: Patient Unable To Answer (06/13/2024)  Transportation Needs: Patient Unable To Answer (06/13/2024)  Utilities: Patient Unable To Answer (06/13/2024)  Tobacco Use: Low Risk  (06/13/2024)    Readmission Risk Interventions     No data to display

## 2024-06-18 LAB — CULTURE, BLOOD (ROUTINE X 2)
Culture: NO GROWTH
Culture: NO GROWTH
Special Requests: ADEQUATE
Special Requests: ADEQUATE

## 2024-07-25 ENCOUNTER — Emergency Department (HOSPITAL_COMMUNITY)

## 2024-07-25 ENCOUNTER — Encounter (HOSPITAL_COMMUNITY): Payer: Self-pay

## 2024-07-25 ENCOUNTER — Other Ambulatory Visit: Payer: Self-pay

## 2024-07-25 ENCOUNTER — Inpatient Hospital Stay (HOSPITAL_COMMUNITY)
Admission: EM | Admit: 2024-07-25 | Discharge: 2024-07-27 | DRG: 394 | Disposition: A | Discharge status: Skilled Nursing Facility | Source: Skilled Nursing Facility | Attending: Internal Medicine | Admitting: Internal Medicine

## 2024-07-25 DIAGNOSIS — D649 Anemia, unspecified: Secondary | ICD-10-CM | POA: Diagnosis present

## 2024-07-25 DIAGNOSIS — E860 Dehydration: Principal | ICD-10-CM | POA: Diagnosis present

## 2024-07-25 DIAGNOSIS — M6249 Contracture of muscle, multiple sites: Secondary | ICD-10-CM | POA: Diagnosis present

## 2024-07-25 DIAGNOSIS — Z8249 Family history of ischemic heart disease and other diseases of the circulatory system: Secondary | ICD-10-CM

## 2024-07-25 DIAGNOSIS — K5900 Constipation, unspecified: Secondary | ICD-10-CM | POA: Diagnosis present

## 2024-07-25 DIAGNOSIS — E870 Hyperosmolality and hypernatremia: Secondary | ICD-10-CM | POA: Diagnosis not present

## 2024-07-25 DIAGNOSIS — Z7401 Bed confinement status: Secondary | ICD-10-CM

## 2024-07-25 DIAGNOSIS — E785 Hyperlipidemia, unspecified: Secondary | ICD-10-CM | POA: Diagnosis present

## 2024-07-25 DIAGNOSIS — K5730 Diverticulosis of large intestine without perforation or abscess without bleeding: Secondary | ICD-10-CM | POA: Diagnosis present

## 2024-07-25 DIAGNOSIS — M6289 Other specified disorders of muscle: Secondary | ICD-10-CM | POA: Diagnosis present

## 2024-07-25 DIAGNOSIS — K9423 Gastrostomy malfunction: Principal | ICD-10-CM | POA: Diagnosis present

## 2024-07-25 DIAGNOSIS — Z431 Encounter for attention to gastrostomy: Secondary | ICD-10-CM

## 2024-07-25 DIAGNOSIS — I10 Essential (primary) hypertension: Secondary | ICD-10-CM | POA: Diagnosis present

## 2024-07-25 DIAGNOSIS — Z7982 Long term (current) use of aspirin: Secondary | ICD-10-CM

## 2024-07-25 DIAGNOSIS — I69354 Hemiplegia and hemiparesis following cerebral infarction affecting left non-dominant side: Secondary | ICD-10-CM

## 2024-07-25 DIAGNOSIS — F411 Generalized anxiety disorder: Secondary | ICD-10-CM | POA: Diagnosis present

## 2024-07-25 DIAGNOSIS — Z79899 Other long term (current) drug therapy: Secondary | ICD-10-CM

## 2024-07-25 LAB — URINALYSIS, W/ REFLEX TO CULTURE (INFECTION SUSPECTED)
Bilirubin Urine: NEGATIVE
Glucose, UA: NEGATIVE mg/dL
Ketones, ur: NEGATIVE mg/dL
Leukocytes,Ua: NEGATIVE
Nitrite: NEGATIVE
Protein, ur: 30 mg/dL — AB
Specific Gravity, Urine: 1.033 — ABNORMAL HIGH (ref 1.005–1.030)
pH: 6 (ref 5.0–8.0)

## 2024-07-25 LAB — COMPREHENSIVE METABOLIC PANEL WITH GFR
ALT: 34 U/L (ref 0–44)
AST: 35 U/L (ref 15–41)
Albumin: 4.6 g/dL (ref 3.5–5.0)
Alkaline Phosphatase: 104 U/L (ref 38–126)
Anion gap: 12 (ref 5–15)
BUN: 27 mg/dL — ABNORMAL HIGH (ref 6–20)
CO2: 31 mmol/L (ref 22–32)
Calcium: 10.4 mg/dL — ABNORMAL HIGH (ref 8.9–10.3)
Chloride: 114 mmol/L — ABNORMAL HIGH (ref 98–111)
Creatinine, Ser: 0.54 mg/dL (ref 0.44–1.00)
GFR, Estimated: >60 mL/min
Glucose, Bld: 162 mg/dL — ABNORMAL HIGH (ref 70–99)
Potassium: 3.6 mmol/L (ref 3.5–5.1)
Sodium: 157 mmol/L — ABNORMAL HIGH (ref 135–145)
Total Bilirubin: 0.4 mg/dL (ref 0.0–1.2)
Total Protein: 8.8 g/dL — ABNORMAL HIGH (ref 6.5–8.1)

## 2024-07-25 LAB — CBC WITH DIFFERENTIAL/PLATELET
Abs Immature Granulocytes: 0.01 K/uL (ref 0.00–0.07)
Basophils Absolute: 0 K/uL (ref 0.0–0.1)
Basophils Relative: 0 %
Eosinophils Absolute: 0.1 K/uL (ref 0.0–0.5)
Eosinophils Relative: 1 %
HCT: 40.8 % (ref 36.0–46.0)
Hemoglobin: 12.1 g/dL (ref 12.0–15.0)
Immature Granulocytes: 0 %
Lymphocytes Relative: 26 %
Lymphs Abs: 1.4 K/uL (ref 0.7–4.0)
MCH: 25.4 pg — ABNORMAL LOW (ref 26.0–34.0)
MCHC: 29.7 g/dL — ABNORMAL LOW (ref 30.0–36.0)
MCV: 85.5 fL (ref 80.0–100.0)
Monocytes Absolute: 0.2 K/uL (ref 0.1–1.0)
Monocytes Relative: 4 %
Neutro Abs: 3.8 K/uL (ref 1.7–7.7)
Neutrophils Relative %: 69 %
Platelets: 224 K/uL (ref 150–400)
RBC: 4.77 MIL/uL (ref 3.87–5.11)
RDW: 16.7 % — ABNORMAL HIGH (ref 11.5–15.5)
WBC: 5.5 K/uL (ref 4.0–10.5)
nRBC: 0 % (ref 0.0–0.2)

## 2024-07-25 LAB — I-STAT CG4 LACTIC ACID, ED
Lactic Acid, Venous: 2 mmol/L (ref 0.5–1.9)
Lactic Acid, Venous: 2.3 mmol/L (ref 0.5–1.9)

## 2024-07-25 LAB — BASIC METABOLIC PANEL WITH GFR
Anion gap: 10 (ref 5–15)
BUN: 16 mg/dL (ref 6–20)
CO2: 28 mmol/L (ref 22–32)
Calcium: 9.5 mg/dL (ref 8.9–10.3)
Chloride: 112 mmol/L — ABNORMAL HIGH (ref 98–111)
Creatinine, Ser: 0.42 mg/dL — ABNORMAL LOW (ref 0.44–1.00)
GFR, Estimated: >60 mL/min
Glucose, Bld: 100 mg/dL — ABNORMAL HIGH (ref 70–99)
Potassium: 3.3 mmol/L — ABNORMAL LOW (ref 3.5–5.1)
Sodium: 150 mmol/L — ABNORMAL HIGH (ref 135–145)

## 2024-07-25 LAB — PROTIME-INR
INR: 0.9 (ref 0.8–1.2)
Prothrombin Time: 13.1 s (ref 11.4–15.2)

## 2024-07-25 LAB — BLOOD GAS, VENOUS
Acid-Base Excess: 8 mmol/L — ABNORMAL HIGH (ref 0.0–2.0)
Bicarbonate: 33.3 mmol/L — ABNORMAL HIGH (ref 20.0–28.0)
O2 Saturation: 98.1 %
Patient temperature: 37
pCO2, Ven: 49 mmHg (ref 44–60)
pH, Ven: 7.44 — ABNORMAL HIGH (ref 7.25–7.43)
pO2, Ven: 77 mmHg — ABNORMAL HIGH (ref 32–45)

## 2024-07-25 LAB — RESP PANEL BY RT-PCR (RSV, FLU A&B, COVID)  RVPGX2
Influenza A by PCR: NEGATIVE
Influenza B by PCR: NEGATIVE
Resp Syncytial Virus by PCR: NEGATIVE
SARS Coronavirus 2 by RT PCR: NEGATIVE

## 2024-07-25 LAB — MAGNESIUM: Magnesium: 2.1 mg/dL (ref 1.7–2.4)

## 2024-07-25 LAB — LACTIC ACID, PLASMA: Lactic Acid, Venous: 1.1 mmol/L (ref 0.5–1.9)

## 2024-07-25 LAB — PHOSPHORUS: Phosphorus: 3.4 mg/dL (ref 2.5–4.6)

## 2024-07-25 MED ORDER — PANTOPRAZOLE SODIUM 40 MG IV SOLR
40.0000 mg | INTRAVENOUS | Status: DC
  Administered 2024-07-25 – 2024-07-26 (×2): 40 mg via INTRAVENOUS
  Filled 2024-07-25 (×2): qty 10

## 2024-07-25 MED ORDER — LACTATED RINGERS IV SOLN: INTRAVENOUS | Status: DC

## 2024-07-25 MED ORDER — BISACODYL 10 MG RE SUPP: 10.0000 mg | Freq: Every day | RECTAL | Status: DC | PRN

## 2024-07-25 MED ORDER — ACETAMINOPHEN 650 MG RE SUPP: 650.0000 mg | Freq: Four times a day (QID) | RECTAL | Status: DC | PRN

## 2024-07-25 MED ORDER — DIAZEPAM 5 MG/ML IJ SOLN: 2.5000 mg | INTRAMUSCULAR | Status: DC | PRN

## 2024-07-25 MED ORDER — SODIUM CHLORIDE 0.9 % IV SOLN
1.0000 g | Freq: Once | INTRAVENOUS | Status: AC
  Administered 2024-07-25: 1 g via INTRAVENOUS
  Filled 2024-07-25: qty 10

## 2024-07-25 MED ORDER — ENOXAPARIN SODIUM 40 MG/0.4ML IJ SOSY
40.0000 mg | PREFILLED_SYRINGE | INTRAMUSCULAR | Status: DC
  Administered 2024-07-25 – 2024-07-26 (×2): 40 mg via SUBCUTANEOUS
  Filled 2024-07-25 (×2): qty 0.4

## 2024-07-25 MED ORDER — IOHEXOL 300 MG/ML  SOLN
100.0000 mL | Freq: Once | INTRAMUSCULAR | Status: AC | PRN
  Administered 2024-07-25: 100 mL via INTRAVENOUS

## 2024-07-25 MED ORDER — SODIUM CHLORIDE 0.45 % IV SOLN: INTRAVENOUS | Status: AC

## 2024-07-25 MED ORDER — LACTATED RINGERS IV BOLUS (SEPSIS)
1000.0000 mL | Freq: Once | INTRAVENOUS | Status: AC
  Administered 2024-07-25: 1000 mL via INTRAVENOUS

## 2024-07-25 MED ORDER — ONDANSETRON HCL 4 MG/2ML IJ SOLN: 4.0000 mg | Freq: Four times a day (QID) | INTRAMUSCULAR | Status: DC | PRN

## 2024-07-25 MED ORDER — SODIUM CHLORIDE 0.45 % IV SOLN: INTRAVENOUS | Status: DC

## 2024-07-25 MED ORDER — FLEET ENEMA RE ENEM
1.0000 | ENEMA | Freq: Once | RECTAL | Status: AC
  Administered 2024-07-25: 1 via RECTAL
  Filled 2024-07-25: qty 1

## 2024-07-25 MED ORDER — ONDANSETRON HCL 4 MG PO TABS: 4.0000 mg | ORAL_TABLET | Freq: Four times a day (QID) | ORAL | Status: DC | PRN

## 2024-07-25 MED ORDER — LACTATED RINGERS IV BOLUS: 1000.0000 mL | Freq: Once | INTRAVENOUS | Status: DC

## 2024-07-25 NOTE — ED Notes (Signed)
 [facility]E notified of ready bed. Transport will be arriving shortly.

## 2024-07-25 NOTE — ED Provider Notes (Signed)
 " [place] [facility]GENC[facility] DEPARTMENT AT [facility] Provider Note  CSN: [id number] Arrival date & time: 07/25/24 1119  Chief Complaint(s) No chief complaint on file.  HPI Charlene [name] is a 59 y.o. female who is here today with her G-tube being dislodged.  She is coming from [facility] and rehab she has a history of ICH with residual aphasia, dysphagia, hemiparesis.  Patient bedbound at baseline.  EMS was called today because patient seemed more lethargic.  They also noted at that time that the patient's G-tube had been displaced.  They are unsure how long it had been displaced for.   Past Medical History 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   Hypertension    Infective otitis externa of both ears    Middle ear effusion, bilateral 04/14/2021   Nausea & vomiting 02/21/2014   Tracheostomy dependent [facility])    Patient Active Problem List   Diagnosis Date Noted   SIRS (systemic inflammatory response syndrome) (HCC) 06/13/2024   Hypernatremia 06/13/2024   Acute metabolic encephalopathy 02/25/2022   Seizure (HCC) 02/24/2022   History of urinary retention-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  04/16/2021   Osteoarthritis of right knee 04/16/2021   HLD (hyperlipidemia) 04/16/2021   Superficial Thrombophlebitis of arm, right (RESOLVED) 04/16/2021   Severe Dental disease 04/16/2021   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    Pseudomonas urinary tract infection    Nonspecific abnormal electrocardiogram (ECG) (EKG)    Knee pain, right    Constipation    Ileus, unspecified (HCC)    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 Authorizing Provider  acetaminophen  (TYLENOL ) 500 MG tablet Place 1,000 mg into feeding tube See admin instructions. Give 2 tablets (1000mg ) via tube twice a day for pain. Also, give 1 tablet (500mg ) every 6 hours as needed for fever or pain.    [provider]  [Paused] amLODipine  (NORVASC ) 10 MG tablet Place 1 tablet (10 mg total) into feeding tube daily. Wait to take this until your doctor or other care provider tells you to start again. 04/16/21   Alto Isaiah CROME, NP  aspirin  81 MG chewable tablet Place 81 mg into feeding tube daily.    [provider]  atorvastatin  (LIPITOR) 20 MG tablet Place 20 mg into feeding tube at bedtime.    [provider]  Baclofen  5 MG TABS Place 5 mg into feeding tube 2 (two) times daily. 04/10/22   [provider]  bisacodyl  (DULCOLAX) 10 MG suppository Place 1 suppository (10 mg total) rectally daily as needed for severe constipation. 06/15/24   Von Bellis, MD  chlorhexidine  (PERIDEX ) 0.12 % solution Use as directed 15 mLs in the mouth or throat 2 (two) times daily.    [provider]  Cholecalciferol (  VITAMIN D -3) 5000 UNIT/ML LIQD Give 5,000 Units by tube every Monday.    [provider]  Dextrose -Sodium Chloride  (DEXTROSE  5 % AND 0.45 % NACL) 5-0.45 % infusion Inject 50 mL/hr into the vein continuous. Use 1 liter intravenously one time a day for hypernatremia/dehydration for 2 days. Run at 8ml/hr for a total of 2L. 06/11/24   [provider]  diclofenac  Sodium (VOLTAREN ) 1 % GEL Apply 4 g topically 4 (four) times daily. Patient taking differently: Apply 4 g  topically every 12 (twelve) hours as needed (knee pain). 04/15/21   Alto Isaiah CROME, NP  docusate (COLACE) 50 MG/5ML liquid Place 100 mg into feeding tube every other day.    [provider]  esomeprazole (NEXIUM) 20 MG capsule 20 mg every other day. Per tube    [provider]  ferrous sulfate 220 (44 Fe) MG/5ML solution Place 7.5 mLs into feeding tube daily.    [provider]  fluticasone  (FLONASE ) 50 MCG/ACT nasal spray Place 2 sprays into both nostrils daily. Patient taking differently: Place 2 sprays into both nostrils daily as needed for allergies. 04/16/21   Alto Isaiah CROME, NP  lidocaine  4 % Place 1 patch onto the skin every evening. Apply to right knee.    [provider]  polyethylene glycol (MIRALAX  / GLYCOLAX ) 17 g packet Place 17 g into feeding tube 2 (two) times daily. 06/15/24   Von Bellis, MD  sodium chloride  (OCEAN) 0.65 % SOLN nasal spray Place 2 sprays into both nostrils in the morning and at bedtime.  06/24/24  [provider]  metoprolol  tartrate (LOPRESSOR ) 25 MG tablet Take 50 mg by mouth daily. 03/10/14 06/03/20  Colton Drape, MD                                                                                                                                    Past Surgical History Past Surgical History:  Procedure Laterality Date   CESAREAN SECTION     ESOPHAGOGASTRODUODENOSCOPY N/A 09/10/2020   Procedure: ESOPHAGOGASTRODUODENOSCOPY (EGD);  Surgeon: Kinsinger, Herlene Righter, MD;  Location: [facility] ENDOSCOPY;  Service: General;  Laterality: N/A;   IR REPLACE G-TUBE SIMPLE WO FLUORO  07/30/2021   IR REPLACE G-TUBE SIMPLE WO FLUORO  12/08/2022   IR REPLACE G-TUBE SIMPLE WO FLUORO  06/12/2023   IR REPLC GASTRO/COLONIC TUBE PERCUT W/FLUORO  02/16/2021   IR REPLC GASTRO/COLONIC TUBE PERCUT W/FLUORO  10/23/2021   IR REPLC GASTRO/COLONIC TUBE PERCUT W/FLUORO  04/14/2022   IR REPLC GASTRO/COLONIC TUBE PERCUT W/FLUORO  04/08/2024   PEG PLACEMENT  N/A 09/10/2020   Procedure: PERCUTANEOUS ENDOSCOPIC GASTROSTOMY (PEG) PLACEMENT;  Surgeon: Stevie Herlene Righter, MD;  Location: [facility] ENDOSCOPY;  Service: General;  Laterality: N/A;   Family History Family History  Problem Relation Age of Onset   Hypertension Father    Cancer Maternal Grandmother    Thyroid disease Paternal Uncle     Social  History Social History[1] Allergies Patient has no known allergies.  Review of Systems Review of Systems  Physical Exam Vital Signs  I have reviewed the triage vital signs LMP 03/03/2014   Physical Exam Vitals and nursing note reviewed.  HENT:     Head: Normocephalic.  Cardiovascular:     Rate and Rhythm: Normal rate.     Pulses: Normal pulses.  Pulmonary:     Effort: Pulmonary effort is normal.  Abdominal:     General: Abdomen is flat. There is distension.     Palpations: Abdomen is soft.     Tenderness: There is no abdominal tenderness.     Comments: G-tube site constricted, there is scabbing present  Musculoskeletal:        General: No swelling or deformity.  Skin:    General: Skin is warm.  Neurological:     Mental Status: She is alert. Mental status is at baseline.     ED Results and Treatments Labs (all labs ordered are listed, but only abnormal results are displayed) Labs Reviewed  RESP PANEL BY RT-PCR (RSV, FLU A&B, COVID)  RVPGX2  CULTURE, BLOOD (ROUTINE X 2)  CULTURE, BLOOD (ROUTINE X 2)  COMPREHENSIVE METABOLIC PANEL WITH GFR  CBC WITH DIFFERENTIAL/PLATELET  PROTIME-INR  URINALYSIS, W/ REFLEX TO CULTURE (INFECTION SUSPECTED)  I-STAT CG4 LACTIC ACID, ED                                                                                                                          Radiology No results found.  Pertinent labs & imaging results that were available during my care of the patient were reviewed by me and considered in my medical decision making (see MDM for details).  Medications Ordered in ED Medications   lactated ringers  infusion (has no administration in time range)  lactated ringers  bolus 1,000 mL (has no administration in time range)                                                                                                                                     Procedures Procedures  (including critical care time)  Medical Decision Making / ED Course   This patient presents to the ED for concern of G-tube dislodgment, lethargy, this involves an extensive number of treatment options, and is a complaint that carries with it a high risk of complications and morbidity.  The differential  diagnosis includes G-tube dislodgment, dehydration, viral syndrome, cystitis, metabolic abnormalities, less likely ICH.  MDM: Patient's G-tube appears to have been dislodged for at least 12 hours.  I attempted to place a 12 French foley in the site and was unable to go past the dermis layer.  This will require replacement.  Reassessment 1:50 PM-patient's sodium elevated, she does appear dry on exam.  Mild patient elevation in her lactic acid, however does not meet sepsis criteria.  ED nurse reports that patient had purulent discharge with straight cath.  Likely cystitis.  Patient still pending her CT abdomen pelvis.   Additional history obtained: -Additional history obtained from EMS -External records from outside source obtained and reviewed including: Chart review including previous notes, labs, imaging, consultation notes   Lab Tests: -I ordered, reviewed, and interpreted labs.   The pertinent results include:   Labs Reviewed  RESP PANEL BY RT-PCR (RSV, FLU A&B, COVID)  RVPGX2  CULTURE, BLOOD (ROUTINE X 2)  CULTURE, BLOOD (ROUTINE X 2)  COMPREHENSIVE METABOLIC PANEL WITH GFR  CBC WITH DIFFERENTIAL/PLATELET  PROTIME-INR  URINALYSIS, W/ REFLEX TO CULTURE (INFECTION SUSPECTED)  I-STAT CG4 LACTIC ACID, ED      EKG my independent review of the patient's EKG shows no ST segment depressions or  elevations, no T wave inversions, no evidence of acute ischemia.  EKG Interpretation Date/Time:    Ventricular Rate:    PR Interval:    QRS Duration:    QT Interval:    QTC Calculation:   R Axis:      Text Interpretation:           Imaging Studies ordered: I ordered imaging studies including chest x-ray I independently visualized and interpreted imaging. I agree with the radiologist interpretation   Medicines ordered and prescription drug management: Meds ordered this encounter  Medications   lactated ringers  infusion   lactated ringers  bolus 1,000 mL    Reason 30 mL/kg dose is not being ordered:   First Lactic Acid Pending    -I have reviewed the patients home medicines and have made adjustments as needed  Cardiac Monitoring: The patient was maintained on a cardiac monitor.  I personally viewed and interpreted the cardiac monitored which showed an underlying rhythm of: Normal sinus rhythm  Social Determinants of Health:  Factors impacting patients care include: Multiple medical comorbidities including prior CVA, status bedbound   Reevaluation: After the interventions noted above, I reevaluated the patient and found that they have :improved  Co morbidities that complicate the patient evaluation  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   Hypertension    Infective otitis externa of both ears    Middle ear effusion, bilateral 04/14/2021   Nausea & vomiting 02/21/2014   Tracheostomy dependent (HCC)          Final Clinical Impression(s) / ED Diagnoses Final diagnoses:  None     @PCDICTATION @     [1]  Social History Tobacco Use   Smoking status: Never   Smokeless tobacco:  Never  Vaping Use   Vaping status: Never Used  Substance Use Topics   Alcohol use: No   Drug use: No   "

## 2024-07-25 NOTE — Sepsis Progress Note (Signed)
 Elink will follow per sepsis protocol.

## 2024-07-25 NOTE — ED Notes (Signed)
 UTO urine with straight catheter, only purulent mucus noted in catheter, could not pull specimen. MD notified.

## 2024-07-25 NOTE — Progress Notes (Signed)
 Cognitively impared

## 2024-07-25 NOTE — ED Triage Notes (Signed)
 Pt bib EMS from [facility]. Pt has a displaced G-tube that has been out from at least 1000 AM. 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   Cbg 181 BP117/62 106-110 HR

## 2024-07-25 NOTE — ED Notes (Signed)
 Attempted report to floor RN [id number], no answer.

## 2024-07-25 NOTE — H&P (Signed)
 " History and Physical    Patient: Charlene [name] FMW:[id number] DOB: 1965-08-24 DOA: 07/25/2024 DOS: the patient was seen and examined on 07/25/2024 PCP: Leon[facility]  Patient coming from: Home  Chief Complaint: Dislodged PEG tube.  HPI: Charlene [name] is a 59 y.o. female with medical history significant of cystitis, respiratory failure, otitis media of the right ear, otitis externa of both ears, bilateral middle ear effusions, AMS, history of CVA, headaches, hypertension, history of chest pain, history of tracheostomy, history of PEG tube who was sent to the emergency department due to PEG getting dislodged.  It is not known how long the tube was out of the stoma.  Dr. Mannie tried twice to place the catheter and I tried afterwards, but the stoma looks closed.  I have spoken to interventional radiology.  I briefly explained to her mother what was happening.  She is unable to provide further information or review of systems.  ED course: Initial vital signs were temperature 97.6 F, pulse 99, respiration 20, BP 128/85 mmHg and O2 sat 96% on room air.  The patient received ceftriaxone  1 g IVPB, LR 1000 mL liter bolus and a Fleet enema.  Lab work: Lactic acid was 2.0 then 2.3 mmol/L.  CBC showed a white count of 5.5, Moding 12.1 g/dL and platelets 775.  Normal PT and INR.  Negative coronavirus, influenza and RSV PCR test.  Venous blood gas showed a pH of 7.44, pCO2 of 49 and pO2 of 77.  Bicarbonate 33.3 and acid-base excess 8.0 mmol/L.  CMP showed a sodium 157, potassium 3.6, chloride 114 and CO2 31 mmol/L with a normal anion gap.  Glucose 162, BUN 27, creatinine 0.54 mg/dL.  Calcium  normalizes to albumin level.  LFTs were unremarkable with the exception of a total protein of 8.8 g/dL.  Imaging: Portable 1 view chest radiograph showed clear lungs.  CT abdomen/pelvis without contrast with no acute findings.  There was moderate volume of fecal loading within the colon.  Nonobstructive right lower pole  nephrolith without hydronephrosis.  Scattered colonic diverticulosis.   Review of Systems: As mentioned in the history of present illness. All other systems reviewed and are negative. 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   Hypertension    Infective otitis externa of both ears    Middle ear effusion, bilateral 04/14/2021   Nausea & vomiting 02/21/2014   Tracheostomy dependent [facility])    Past Surgical History:  Procedure Laterality Date   CESAREAN SECTION     ESOPHAGOGASTRODUODENOSCOPY N/A 09/10/2020   Procedure: ESOPHAGOGASTRODUODENOSCOPY (EGD);  Surgeon: Kinsinger, Herlene Righter, MD;  Location: [facility] ENDOSCOPY;  Service: General;  Laterality: N/A;   IR REPLACE G-TUBE SIMPLE WO FLUORO  07/30/2021   IR REPLACE G-TUBE SIMPLE WO FLUORO  12/08/2022   IR REPLACE G-TUBE SIMPLE WO FLUORO  06/12/2023   IR REPLC GASTRO/COLONIC TUBE PERCUT W/FLUORO  02/16/2021   IR REPLC GASTRO/COLONIC TUBE PERCUT W/FLUORO  10/23/2021   IR REPLC GASTRO/COLONIC TUBE PERCUT W/FLUORO  04/14/2022   IR REPLC GASTRO/COLONIC TUBE PERCUT W/FLUORO  04/08/2024   PEG PLACEMENT N/A 09/10/2020   Procedure: PERCUTANEOUS ENDOSCOPIC GASTROSTOMY (PEG) PLACEMENT;  Surgeon: Stevie Herlene Righter, MD;  Location: [facility]  ENDOSCOPY;  Service: General;  Laterality: N/A;   Social History:  reports that she has never smoked. She has never used smokeless tobacco. She reports that she does not drink alcohol and does not use drugs.  Allergies[1]  Family History  Problem Relation Age of Onset   Hypertension Father    Cancer Maternal Grandmother    Thyroid disease Paternal Uncle     Prior to Admission medications  Medication Sig Start Date End Date Taking? Authorizing Provider   acetaminophen  (TYLENOL ) 500 MG tablet Place 1,000 mg into feeding tube See admin instructions. Give 2 tablets (1000mg ) via tube twice a day for pain. Also, give 1 tablet (500mg ) every 6 hours as needed for fever or pain.    [provider]  [Paused] amLODipine  (NORVASC ) 10 MG tablet Place 1 tablet (10 mg total) into feeding tube daily. Wait to take this until your doctor or other care provider tells you to start again. 04/16/21   Alto Isaiah CROME, NP  aspirin  81 MG chewable tablet Place 81 mg into feeding tube daily.    [provider]  atorvastatin  (LIPITOR) 20 MG tablet Place 20 mg into feeding tube at bedtime.    [provider]  Baclofen  5 MG TABS Place 5 mg into feeding tube 2 (two) times daily. 04/10/22   [provider]  bisacodyl  (DULCOLAX) 10 MG suppository Place 1 suppository (10 mg total) rectally daily as needed for severe constipation. 06/15/24   Von Bellis, MD  chlorhexidine  (PERIDEX ) 0.12 % solution Use as directed 15 mLs in the mouth or throat 2 (two) times daily.    [provider]  Cholecalciferol (VITAMIN D -3) 5000 UNIT/ML LIQD Give 5,000 Units by tube every Monday.    [provider]  Dextrose -Sodium Chloride  (DEXTROSE  5 % AND 0.45 % NACL) 5-0.45 % infusion Inject 50 mL/hr into the vein continuous. Use 1 liter intravenously one time a day for hypernatremia/dehydration for 2 days. Run at 75ml/hr for a total of 2L. 06/11/24   [provider]  diclofenac  Sodium (VOLTAREN ) 1 % GEL Apply 4 g topically 4 (four) times daily. Patient taking differently: Apply 4 g topically every 12 (twelve) hours as needed (knee pain). 04/15/21   Alto Isaiah CROME, NP  docusate (COLACE) 50 MG/5ML liquid Place 100 mg into feeding tube every other day.    [provider]  esomeprazole (NEXIUM) 20 MG capsule 20 mg every other day. Per tube    [provider]  ferrous sulfate 220 (44 Fe) MG/5ML solution Place 7.5 mLs into feeding  tube daily.    [provider]  fluticasone  (FLONASE ) 50 MCG/ACT nasal spray Place 2 sprays into both nostrils daily. Patient taking differently: Place 2 sprays into both nostrils daily as needed for allergies. 04/16/21   Alto Isaiah CROME, NP  lidocaine  4 % Place 1 patch onto the skin every evening. Apply to right knee.    [provider]  polyethylene glycol (MIRALAX  / GLYCOLAX ) 17 g packet Place 17 g into feeding tube 2 (two) times daily. 06/15/24   Von Bellis, MD  sodium chloride  (OCEAN) 0.65 % SOLN nasal spray Place 2 sprays into both nostrils in the morning and at bedtime.  06/24/24  [provider]  metoprolol  tartrate (LOPRESSOR ) 25 MG tablet Take 50 mg by mouth daily. 03/10/14 06/03/20  Colton Drape, MD    Physical Exam: Vitals:   07/25/24 1138 07/25/24 1230 07/25/24 1442  BP: 128/85 117/79 (!) 142/95  Pulse: 99  92  Resp: 20 15 19   Temp: 97.6 F (36.4 C)  98.3 F (36.8 C)  TempSrc:   Axillary  SpO2: 96%  99%   Physical Exam Vitals and nursing note reviewed.  Constitutional:      General: She is awake. She is not in acute distress.    Appearance: Normal appearance. She is ill-appearing.  HENT:     Head: Normocephalic.     Nose: No rhinorrhea.     Mouth/Throat:     Mouth: Mucous membranes are dry.  Eyes:     General: No scleral icterus. Neck:     Vascular: No JVD.  Cardiovascular:     Rate and Rhythm: Normal rate and regular rhythm.     Heart sounds: S1 normal and S2 normal.  Pulmonary:     Effort: Pulmonary effort is normal.     Breath sounds: Normal breath sounds. No wheezing, rhonchi or rales.  Abdominal:     General: Bowel sounds are normal. There is no distension.     Palpations: Abdomen is soft.  Musculoskeletal:     Cervical back: Neck supple.     Right lower leg: No edema.     Left lower leg: No edema.  Skin:    General: Skin is warm and dry.  Neurological:     Mental Status: She is alert. Mental status is at baseline.      Sensory: Sensory deficit present.     Motor: Weakness present.     Coordination: Coordination abnormal.     Deep Tendon Reflexes: Reflexes abnormal.     Comments: Nonverbal.  Psychiatric:        Behavior: Behavior is cooperative.     Data Reviewed:  Results are pending, will review when available.  Assessment and Plan: Principal Problem:   Hypernatremia In the setting of PEG dysfunction. Observation/telemetry Keep NPO. Continue IV fluids. Follow sodium level.  Active Problems:   PEG (percutaneous endoscopic gastrostomy)    adjustment/replacement/removal (HCC) Keep n.p.o. for now. IR was consulted. Unable to pass catheter.    Essential hypertension, benign Hold antihypertensives for now. As needed parenteral antihypertensives.    Anxiety state Diazepam  2.5 mg IVP every 4 hours as needed.    Muscle hypertonicity May use diazepam  and IV as a muscle relaxant.    Constipation Continue Dulcolax suppository.    HLD (hyperlipidemia) Not on medical therapy. Follow-up with PCP.     Advance Care Planning:   Code Status: Full Code   Consults: Interventional radiology.  Family Communication:   Severity of Illness: The appropriate patient status for this patient is OBSERVATION. Observation status is judged to be reasonable and necessary in order to provide the required intensity of service to ensure the patient's safety. The patient's presenting symptoms, physical exam findings, and initial radiographic and laboratory data in the context of their medical condition is felt to place them at decreased risk for further clinical deterioration. Furthermore, it is anticipated that the patient will be medically stable for discharge from the hospital within 2 midnights of admission.   Author: Alm Dorn Castor, MD 07/25/2024 5:27 PM  For on call review [email or web address].   This document was prepared using Dragon voice recognition software and may contain some unintended  transcription errors.     [1] No Known Allergies  "

## 2024-07-25 NOTE — ED Notes (Signed)
 NO antibx ordered for patient MD asked, no new orders placed.

## 2024-07-26 ENCOUNTER — Observation Stay (HOSPITAL_COMMUNITY)

## 2024-07-26 DIAGNOSIS — E870 Hyperosmolality and hypernatremia: Secondary | ICD-10-CM | POA: Diagnosis not present

## 2024-07-26 HISTORY — PX: IR REPLACE G-TUBE SIMPLE WO FLUORO: IMG2323

## 2024-07-26 LAB — COMPREHENSIVE METABOLIC PANEL WITH GFR
ALT: 25 U/L (ref 0–44)
AST: 28 U/L (ref 15–41)
Albumin: 4.4 g/dL (ref 3.5–5.0)
Alkaline Phosphatase: 93 U/L (ref 38–126)
Anion gap: 14 (ref 5–15)
BUN: 11 mg/dL (ref 6–20)
CO2: 26 mmol/L (ref 22–32)
Calcium: 9.6 mg/dL (ref 8.9–10.3)
Chloride: 105 mmol/L (ref 98–111)
Creatinine, Ser: 0.45 mg/dL (ref 0.44–1.00)
GFR, Estimated: >60 mL/min
Glucose, Bld: 95 mg/dL (ref 70–99)
Potassium: 3.3 mmol/L — ABNORMAL LOW (ref 3.5–5.1)
Sodium: 145 mmol/L (ref 135–145)
Total Bilirubin: 0.7 mg/dL (ref 0.0–1.2)
Total Protein: 7.9 g/dL (ref 6.5–8.1)

## 2024-07-26 LAB — CBC
HCT: 38.2 % (ref 36.0–46.0)
Hemoglobin: 11.5 g/dL — ABNORMAL LOW (ref 12.0–15.0)
MCH: 25.2 pg — ABNORMAL LOW (ref 26.0–34.0)
MCHC: 30.1 g/dL (ref 30.0–36.0)
MCV: 83.8 fL (ref 80.0–100.0)
Platelets: 179 K/uL (ref 150–400)
RBC: 4.56 MIL/uL (ref 3.87–5.11)
RDW: 16.1 % — ABNORMAL HIGH (ref 11.5–15.5)
WBC: 4.9 K/uL (ref 4.0–10.5)
nRBC: 0 % (ref 0.0–0.2)

## 2024-07-26 LAB — GLUCOSE, CAPILLARY
Glucose-Capillary: 101 mg/dL — ABNORMAL HIGH (ref 70–99)
Glucose-Capillary: 105 mg/dL — ABNORMAL HIGH (ref 70–99)
Glucose-Capillary: 96 mg/dL (ref 70–99)
Glucose-Capillary: 98 mg/dL (ref 70–99)

## 2024-07-26 MED ORDER — LIDOCAINE VISCOUS HCL 2 % MT SOLN
OROMUCOSAL | Status: AC
  Filled 2024-07-26: qty 15

## 2024-07-26 MED ORDER — SENNOSIDES-DOCUSATE SODIUM 8.6-50 MG PO TABS: 1.0000 | ORAL_TABLET | Freq: Every day | ORAL | Status: DC

## 2024-07-26 MED ORDER — IOHEXOL 300 MG/ML  SOLN
30.0000 mL | Freq: Once | INTRAMUSCULAR | Status: AC | PRN
  Administered 2024-07-26: 30 mL via ORAL

## 2024-07-26 MED ORDER — SODIUM CHLORIDE 0.45 % IV SOLN: INTRAVENOUS | Status: DC

## 2024-07-26 MED ORDER — POLYETHYLENE GLYCOL 3350 17 G PO PACK: 17.0000 g | PACK | Freq: Every day | ORAL | Status: DC | PRN

## 2024-07-26 MED ORDER — JEVITY 1.5 CAL/FIBER PO LIQD
1000.0000 mL | ORAL | Status: DC
  Administered 2024-07-26: 1000 mL
  Filled 2024-07-26 (×2): qty 1000

## 2024-07-26 MED ORDER — DOCUSATE SODIUM 50 MG/5ML PO LIQD
100.0000 mg | ORAL | Status: DC
  Administered 2024-07-26: 100 mg
  Filled 2024-07-26: qty 10

## 2024-07-26 MED ORDER — SODIUM CHLORIDE 0.45 % IV SOLN: INTRAVENOUS | Status: AC

## 2024-07-26 MED ORDER — POLYETHYLENE GLYCOL 3350 17 G PO PACK
17.0000 g | PACK | Freq: Two times a day (BID) | ORAL | Status: DC
  Administered 2024-07-26 (×2): 17 g
  Filled 2024-07-26 (×2): qty 1

## 2024-07-26 NOTE — Plan of Care (Signed)
   Problem: Coping: Goal: Level of anxiety will decrease Outcome: Progressing   Problem: Pain Managment: Goal: General experience of comfort will improve and/or be controlled Outcome: Progressing   Problem: Safety: Goal: Ability to remain free from injury will improve Outcome: Progressing

## 2024-07-26 NOTE — Progress Notes (Addendum)
 SLP Cancellation Note  Patient Details Name: Charlene [name] MRN: [id number] DOB: 06/03/1966   Cancelled treatment:        Attempted to see pt for clinical swallow assessment. Pt is well known to this service from prior admissions. Pt off floor for procedure at time of attempt.  In IR for PEG replacement.  SLP will reattempt as schedule permits.   Anette FORBES Grippe, MA, CCC-SLP Acute Rehabilitation Services Office: [phone]  07/26/2024, 9:43 AM

## 2024-07-26 NOTE — Care Management Obs Status (Addendum)
 MEDICARE OBSERVATION STATUS NOTIFICATION   Patient Details  Name: AFTON LAVALLE MRN: [id number] Date of Birth: [birth year]   Medicare Observation Status Notification Given:  Yes Observation notice mailed due to spouse not on site. Notice verbally explained via phone and patient is nonverbal.     Toy LITTIE Agar, RN 07/26/2024, 11:23 AM

## 2024-07-26 NOTE — Progress Notes (Signed)
 Initial Nutrition Assessment  INTERVENTION:   Resume tube feeds: -Jevity 1.5 @ 45 ml/hr via PEG -Provides 1620 kcals, 68g protein and 819 ml H2O  If diet is advanced, can run Jevity 1.5 @ 45 ml/hr x 15 hours (1500-0600). Providing 1012 kcals (61% of needs), 43g protein (61% of needs) and 512 ml H2O Allowing time to eat during the day.  At discharge recommend if diet is resumed: -Isosource 1.5 @ 69ml/hr x 15 hours (1500-0600). -Providing 1012 kcals, 45g protein and 515 ml H2O  NUTRITION DIAGNOSIS:   Inadequate oral intake related to dysphagia as evidenced by NPO status.  GOAL:   Patient will meet greater than or equal to 90% of their needs  MONITOR:   Diet advancement, TF tolerance  REASON FOR ASSESSMENT:   Consult Enteral/tube feeding initiation and management, Assessment of nutrition requirement/status  ASSESSMENT:   59 y.o. 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  At one point, she needed tracheostomy as well.  Also had a PEG tube placed but she has had multiple instances where she pulled out the PEG tube.  Also her swallowing is gradually improved and is PEG tube has not been used consistently recently.    1/8, patient was sent to the ED by EMS with dislodged G-tube.  Patient in room, mother at bedside. Pt is nonverbal but able to open eyes and shook my hand.  Pt is s/p PEG replacement today via IR. Pt dislodged her tube. Time frame without tube is undetermined as stoma was found to be closed.  Per mother, pt eats by mouth a pureed diet and only family feeds her. Tube feeds run at night, starting around 3pm and turning off at 6am. Mother not certain what formula she gets at facility but states it hasn't changed. Was previously seen by RD in November 2025 and was started on Jevity 1.5 as Isosource 1.5 was identified as formula at facility.  Mother reports  concerns that pt is receiving too much nutrition via tube and has been gaining weight. Records indicate pt weighed ~105 lbs in September 2025 and now pt weighs around 140 lbs. If diet is resumed, may be able to infuse tube feeding at a lower rate as to not overfeed. If diet is not advanced and recommended, 24 hour feeds recommended to meet 100% of nutrition.  Mother would like SLP to see patient to see if diet can be started. Alerted RN.  MD okay to have tube feeds resumed today.   Weighed patient on bedscale: 146 lbs (with blankets and pillows on bed).   Medications: Colace, Miralax   Labs reviewed: CBGs: 96-101 Low potassium   NUTRITION - FOCUSED PHYSICAL EXAM:  Flowsheet Row Most Recent Value  Orbital Region No depletion  Upper Arm Region Unable to assess  [contracted]  Thoracic and Lumbar Region No depletion  Buccal Region No depletion  Temple Region No depletion  Clavicle Bone Region No depletion  Clavicle and Acromion Bone Region No depletion  Scapular Bone Region Unable to assess  Dorsal Hand No depletion  Patellar Region Unable to assess  [contracted]  Anterior Thigh Region Unable to assess  Posterior Calf Region Unable to assess  Edema (RD Assessment) None  Hair Reviewed  Eyes Reviewed  Mouth Reviewed  Skin Reviewed  Nails Unable to assess  [painted]    Diet Order:   Diet Order  Diet NPO time specified  Diet effective now                   EDUCATION NEEDS:   Education needs have been addressed  Skin:  Skin Assessment: Reviewed RN Assessment  Last BM:  PTA  Height:   Ht Readings from Last 1 Encounters:  04/07/24 5' 5 (1.651 m)    Weight:   Wt Readings from Last 1 Encounters:  06/13/24 67.8 kg  Weight on bed scale 07/26/24: 66.4 kg (146 lbs)   BMI:  24.3 kg/m^2  Estimated Nutritional Needs:   Kcal:  1650-1850  Protein:  70-80g  Fluid:  1.8L/day   Morna Lee, MS, RD, LDN Inpatient Clinical Dietitian Contact via  Secure chat

## 2024-07-26 NOTE — Progress Notes (Signed)
 " PROGRESS NOTE  Charlene [name]  DOB: [birth year]  PCP: Leonidas FMW:[id number]  DOA: 07/25/2024  LOS: 0 days  Hospital Day: 2  Subjective: Patient was seen and examined this morning. Middle-aged African-American female.  Lying on bed.  Alert, awake, eyes open.  Able to move right upper extremity.  Contracted lower extremities noted secondary stroke. Mother at bedside. Remains afebrile, hemodynamically stable, breathing room air  Brief narrative: Charlene [name] is a 59 y.o. 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  At one point, she needed tracheostomy as well.  Also had a PEG tube placed but she has had multiple instances where she pulled out the PEG tube.  Also her swallowing is gradually improved and is PEG tube has not been used consistently recently.   1/8, patient was sent to the ED by EMS with dislodged G-tube.  Per the nursing home, he was out for only few hours but when the EDP tried to reinsert the catheter, the stoma seemed closed and hence it could not be reinserted.  In the ED, patient was afebrile, hemodynamically stable. VBG with pH 7.4, pCO2 49 Sodium significantly elevated 157, BUN/creatinine 27/0.54, lactic acid 2 cardiovascular normal Resp virus panel was unremarkable Urinalysis showed hazy straw-colored urine with cells, rare bacteria CT abdomen did not show any acute findings but showed moderate volume of stool in the colon, scattered colonic diverticulosis, nonobstructive right renal calculus.  Started on IV fluids Admitted to [facility] IR was consulted for insertion of the catheter.  Assessment and plan: Dislodged PEG tube Unclear how long it was out.  Per nursing home report, it was only noted around 1-2 hours prior to presentation. EDP tried to reinsert the catheter but, stoma already seemed closed.   Based on patient's level of dehydration  hypernatremia, PEG tube probably has been dislodged for days. I had a long conversation with patient's daughter this morning.  She initially inclined not to insert the PEG tube.  IR attending also spoke with her and she agreed to PEG tube placement at the time. I have also requested for speech and nutritionist consult.  Acute hypernatremia Presented with significantly elevated sodium at 157.  Gradually improving with IV fluid. Sodium this morning is better at 145. Currently on half NS at 150 mL/h.  I will reduce the rate to 75 mL/h and run for another 24 hours. continue to monitor Recent Labs  Lab 07/25/24 1209 07/25/24 2134 07/26/24 0737  NA 157* 150* 145  K 3.6 3.3* 3.3*  CL 114* 112* 105  CO2 31 28 26   GLUCOSE 162* 100* 95  BUN 27* 16 11  CREATININE 0.54 0.42* 0.45  CALCIUM  10.4* 9.5 9.6  MG  --  2.1  --   PHOS  --  3.4  --    Constipation Colonic diverticulosis CT abdomen showed moderate volume of stool in the colon, scattered colonic diverticulosis 1 dose of enema was given in the ED Start bowel regimen with scheduled docusate and MiraLAX   Hypertension Not on any antiplatelet sepsis  H/o CVA, HLD Muscle contractures Patient seems to have significant plegia and contracture of left upper extremity and both lower extremities.  Per family, she is able to write with her right upper extremity.  But unable to feed herself.   Family hopes that she will overall improve at the nursing home.  They hope to get her home ultimately.  I offered palliative care consultation but family did not agree with at this time. Continue aspirin , statin, baclofen   Chronic anemia Baseline hemoglobin between 9 and 10. Continue PPI, iron supplement, PPI Recent Labs    06/13/24 0346 06/14/24 0451 06/15/24 0409 07/25/24 1209 07/26/24 0737  HGB 9.0* 9.3* 9.5* 12.1 11.5*  MCV 83.5 81.5 81.5 85.5 83.8    Nutrition Status:         Mobility: Bedbound status  PT Orders:   PT Follow up  Rec:     Goals of care   Code Status: Full Code     DVT prophylaxis:  enoxaparin  (LOVENOX ) injection 40 mg Start: 07/25/24 1700   Antimicrobials: None Fluid: Half NS at 75 mL/h Consultants: IR Family Communication: Mother at bedside.  Daughter on the phone.  Status: Inpatient Level of care:  Telemetry   Patient is from: Long-term nursing home patient Needs to continue in-hospital care: Back to long-term care Anticipated d/c to: Pending clinical course      Diet:  Diet Order             Diet NPO time specified  Diet effective now                   Scheduled Meds:  docusate  100 mg Per Tube QODAY   enoxaparin  (LOVENOX ) injection  40 mg Subcutaneous Q24H   pantoprazole  (PROTONIX ) IV  40 mg Intravenous Q24H   polyethylene glycol  17 g Per Tube BID    PRN meds: acetaminophen , diazepam , ondansetron  **OR** ondansetron  (ZOFRAN ) IV   Infusions:   sodium chloride      lactated ringers       Antimicrobials: Anti-infectives (From admission, onward)    Start     Dose/Rate Route Frequency Ordered Stop   07/25/24 1530  cefTRIAXone  (ROCEPHIN ) 1 g in sodium chloride  0.9 % 100 mL IVPB        1 g 200 mL/hr over 30 Minutes Intravenous  Once 07/25/24 1527 07/25/24 1624       Objective: Vitals:   07/26/24 0349 07/26/24 0816  BP: 127/81 129/77  Pulse: 95 84  Resp:  16  Temp: 97.8 F (36.6 C) 98 F (36.7 C)  SpO2: 98% 97%   No intake or output data in the 24 hours ending 07/26/24 1307 There were no vitals filed for this visit. Weight change:  There is no height or weight on file to calculate BMI.   Physical Exam: General exam: Pleasant, middle-aged African-American female Skin: No rashes, lesions or ulcers. HEENT: Atraumatic, normocephalic, no obvious bleeding Lungs: Clear to auscultation bilaterally,  CVS: S1, S2, no murmur,   GI/Abd: Soft, nontender, nondistended, bowel sound present,   CNS: Patient has deficits from prior stroke.  Contracted  extremities Psychiatry: Sad affect Extremities: No pedal edema, no calf tenderness,   Data Review: I have personally reviewed the laboratory data and studies available.  F/u labs ordered Unresulted Labs (From admission, onward)    None       Signed, Chapman Rota, MD [facility] 07/26/2024  "

## 2024-07-26 NOTE — Plan of Care (Signed)
  Problem: Clinical Measurements: Goal: Will remain free from infection Outcome: Progressing   Problem: Nutrition: Goal: Adequate nutrition will be maintained Outcome: Progressing   Problem: Elimination: Goal: Will not experience complications related to bowel motility Outcome: Progressing Goal: Will not experience complications related to urinary retention Outcome: Progressing   Problem: Safety: Goal: Ability to remain free from injury will improve Outcome: Progressing   Problem: Skin Integrity: Goal: Risk for impaired skin integrity will decrease Outcome: Progressing

## 2024-07-26 NOTE — Procedures (Signed)
 Replacement of dislodged 16 french balloon retention gastrostomy tube with new 12 french balloon retention G tube performed without immediate complications. 3 cc saline instilled into balloon . Tube secured to skin site and flushed without difficulty. EBL< 2 cc. Follow up abd film pending.

## 2024-07-26 NOTE — TOC Initial Note (Signed)
 Transition of Care [facility]) - Initial/Assessment Note    Patient Details  Name: Charlene [name] MRN: [id number] Date of Birth: 04/22/1966  Transition of [facility]) CM/SW Contact:    Toy LITTIE Agar, RN Phone [phone]  07/26/2024, 12:46 PM  Clinical Narrative:                 Inpatient care manager following patient admitted from [facility] as a long term care resident. Patient sent to the emergency department due to PEG getting dislodged. CM spoke with spouse due to patient being nonverbal. Patient is from [facility] where she is a long term resident. Plan will be to return to [facility] once medically cleared. Inpatient care manager to follow.   Expected Discharge Plan: Long Term Nursing Home Barriers to Discharge: Continued Medical Work up   Patient Goals and CMS Choice Patient states their goals for this hospitalization and ongoing recovery are:: patient is non verbal per husband to return to [email or web address] Compare Post Acute Care list provided to::  (return to long term care facility no choice needed) Choice offered to / list presented to : NA [place] ownership interest in [facility].provided to::  (n/a)    Expected Discharge Plan and Services In-house Referral: NA Discharge Planning Services: CM Consult Post Acute Care Choice: Nursing Home Living arrangements for the past 2 months: Skilled Nursing Facility                 DME Arranged: N/A DME Agency: NA       HH Arranged: NA          Prior Living Arrangements/Services Living arrangements for the past 2 months: Skilled Nursing Facility Lives with:: Facility Resident Patient language and need for interpreter reviewed:: Yes Do you feel safe going back to the place where you live?: Yes      Need for Family Participation in Patient Care: Yes (Comment) Care giver support system in place?: Yes (comment) Current home services:  (n/a) Criminal Activity/Legal Involvement Pertinent to  Current Situation/Hospitalization: No - Comment as needed  Activities of Daily Living   ADL Screening (condition at time of admission) Independently performs ADLs?: No Does the patient have a NEW difficulty with bathing/dressing/toileting/self-feeding that is expected to last >3 days?: No Does the patient have a NEW difficulty with getting in/out of bed, walking, or climbing stairs that is expected to last >3 days?: No Does the patient have a NEW difficulty with communication that is expected to last >3 days?: No Is the patient deaf or have difficulty hearing?: Yes Does the patient have difficulty seeing, even when wearing glasses/contacts?: No Does the patient have difficulty concentrating, remembering, or making decisions?: Yes  Permission Sought/Granted Permission sought to share information with : Family Supports Permission granted to share information with : No (patient non verbal)  Share Information with NAME: Juliene Blumenthal [phone]     Permission granted to share info w Relationship: spouse  Permission granted to share info w Contact Information: [phone]  Emotional Assessment Appearance:: Appears stated age Attitude/Demeanor/Rapport: Unable to Assess Affect (typically observed): Unable to Assess Orientation: :  (non verbal unable to assess)   Psych Involvement: No (comment)  Admission diagnosis:  Dehydration [E86.0] Hypernatremia [E87.0] Patient Active Problem List   Diagnosis Date Noted   PEG (percutaneous endoscopic gastrostomy) adjustment/replacement/removal (HCC) 07/25/2024   SIRS (systemic inflammatory response syndrome) (HCC) 06/13/2024   Hypernatremia 06/13/2024   Acute metabolic encephalopathy 02/25/2022   Seizure (HCC) 02/24/2022   History of  urinary retention-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  04/16/2021   Osteoarthritis of right knee 04/16/2021   HLD (hyperlipidemia) 04/16/2021   Superficial Thrombophlebitis of arm, right (RESOLVED) 04/16/2021   Severe Dental disease  04/16/2021   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    Pseudomonas urinary tract infection    Nonspecific abnormal electrocardiogram (ECG) (EKG)    Knee pain, right    Constipation    Ileus, unspecified (HCC)    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 [place] [address] Phone: [phone] Fax: [phone]     Social Drivers of Health (SDOH) Social History: SDOH Screenings   Food Insecurity: Patient Unable To Answer (07/25/2024)  Housing: Unknown (07/25/2024)  Transportation Needs: Patient Unable To Answer (07/25/2024)  Utilities: Patient Unable To Answer (07/25/2024)  Tobacco Use: Low Risk (07/25/2024)   SDOH Interventions:     Readmission Risk Interventions     No data to display

## 2024-07-27 DIAGNOSIS — E870 Hyperosmolality and hypernatremia: Secondary | ICD-10-CM | POA: Diagnosis not present

## 2024-07-27 LAB — PHOSPHORUS: Phosphorus: 3.3 mg/dL (ref 2.5–4.6)

## 2024-07-27 LAB — GLUCOSE, CAPILLARY
Glucose-Capillary: 111 mg/dL — ABNORMAL HIGH (ref 70–99)
Glucose-Capillary: 128 mg/dL — ABNORMAL HIGH (ref 70–99)
Glucose-Capillary: 130 mg/dL — ABNORMAL HIGH (ref 70–99)
Glucose-Capillary: 131 mg/dL — ABNORMAL HIGH (ref 70–99)

## 2024-07-27 LAB — MAGNESIUM: Magnesium: 2.3 mg/dL (ref 1.7–2.4)

## 2024-07-27 MED ORDER — ISOSOURCE 1.5 CAL PO LIQD: ORAL | Status: AC

## 2024-07-27 NOTE — Progress Notes (Signed)
 Report called to Dorina, CHARITY FUNDRAISER at [place].

## 2024-07-27 NOTE — Discharge Summary (Signed)
 "  Physician Discharge Summary  Charlene [name] FMW:[id number] DOB: 1965-10-04 DOA: 07/25/2024  PCP: [facility]  Admit date: 07/25/2024 Discharge date: 07/27/2024  Ad[facility] from[facility] Discharge disposition: Back to nursing home  Recommendations at discharge:  Please use abdominal binder to secure PEG tube and prevent it from being pulled out again Per nutritionist recommendation, discharge tube feeding : Isosource 1.5 @ 64ml/hr x 15 hours (1500-0600). Can continue to feed oral as she was doing before.   Subjective: Patient was seen and examined this morning. Lying on bed.  Eyes open.  Unable to have a communication. I called and updated her mother on the phone.  She is agreeable to discharge plan today Afebrile, hemodynamically stable Labs this morning showed phosphorus and magnesium  level normal  Brief narrative: Charlene [name] is a 59 y.o. 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  At one point, she needed tracheostomy as well.  Also had a PEG tube placed but she has had multiple instances where she pulled out the PEG tube.  Also her swallowing is gradually improved and is PEG tube has not been used consistently recently.   1/8, patient was sent to the ED by EMS with dislodged G-tube.  Per the nursing home, he was out for only few hours but when the EDP tried to reinsert the catheter, the stoma seemed closed and hence it could not be reinserted.  In the ED, patient was afebrile, hemodynamically stable. VBG with pH 7.4, pCO2 49 Sodium significantly elevated 157, BUN/creatinine 27/0.54, lactic acid 2 cardiovascular normal Resp virus panel was unremarkable Urinalysis showed hazy straw-colored urine with cells, rare bacteria CT abdomen did not show any acute findings but showed moderate volume of stool in the colon, scattered colonic diverticulosis,  nonobstructive right renal calculus.  Started on IV fluids Admitted to [facility] IR was consulted for insertion of the catheter.  Hospital course: Dislodged PEG tube Unclear how long it was out.  Per nursing home report, it was only noted around 1-2 hours prior to presentation. EDP tried to reinsert the catheter but, stoma already seemed closed.   Based on patient's level of dehydration hypernatremia, PEG tube probably has been dislodged for days. I had a long conversation with patient's daughter this morning.  She initially inclined not to insert the PEG tube.  IR attending also spoke with her and she agreed to PEG tube placement at the time. 1/9, PEG tube was placed. I have also requested for speech and nutritionist consult. 1/9, seen by nutritionist.  Recommended discharge tube feeding : Isosource 1.5 @ 40ml/hr x 15 hours (1500-0600).  Acute hypernatremia Presented with significantly elevated sodium at 157.   With IV hydration, sodium level gradually improved to normal. Recent Labs  Lab 07/25/24 1209 07/25/24 2134 07/26/24 0737 07/27/24 0628  NA 157* 150* 145  --   K 3.6 3.3* 3.3*  --   CL 114* 112* 105  --   CO2 31 28 26   --   GLUCOSE 162* 100* 95  --   BUN 27* 16 11  --   CREATININE 0.54 0.42* 0.45  --   CALCIUM  10.4* 9.5 9.6  --   MG  --  2.1  --  2.3  PHOS  --  3.4  --  3.3   Constipation Colonic diverticulosis CT abdomen showed moderate volume of stool in the colon, scattered colonic diverticulosis  1 dose of enema was given in the ED Started on bowel regimen with scheduled docusate and MiraLAX   Hypertension Not on any antiplatelet sepsis  H/o CVA, HLD Muscle contractures Patient seems to have significant plegia and contracture of left upper extremity and both lower extremities.  Per family, she is able to write with her right upper extremity.  But unable to feed herself.   Family hopes that she will overall improve at the nursing home. They hope to get her home  ultimately. I offered palliative care consultation but family did not agree with at this time. Continue aspirin , statin, baclofen .  Chronic anemia Baseline hemoglobin between 9 and 10. Continue PPI, iron supplement, PPI Recent Labs    06/13/24 0346 06/14/24 0451 06/15/24 0409 07/25/24 1209 07/26/24 0737  HGB 9.0* 9.3* 9.5* 12.1 11.5*  MCV 83.5 81.5 81.5 85.5 83.8    Nutrition Status: Nutrition Problem: Inadequate oral intake Etiology: dysphagia Signs/Symptoms: NPO status Interventions: Tube feeding   Goals of care   Code Status: Full Code   Wounds:  -    Discharge Medications:   Allergies as of 07/27/2024   No Known Allergies      Medication List     STOP taking these medications    amLODipine  10 MG tablet Commonly known as: NORVASC        TAKE these medications    acetaminophen  500 MG tablet Commonly known as: TYLENOL  Place 1,000 mg into feeding tube in the morning and at bedtime. Give 2 tablets (1000mg ) via tube in the morning and before bedtime - AND - give 1 tablet (500mg ) every 6 hours as needed for fever or pain.   aspirin  81 MG chewable tablet Place 81 mg into feeding tube daily.   atorvastatin  20 MG tablet Commonly known as: LIPITOR Place 20 mg into feeding tube at bedtime.   Baclofen  5 MG Tabs Place 5 mg into feeding tube in the morning and at bedtime.   barrier cream Crea Commonly known as: non-specified Apply 1 Application topically See admin instructions. Apply barrier cream with all incontinent care every shift to maintain skin integrity.   bisacodyl  10 MG suppository Commonly known as: DULCOLAX Place 1 suppository (10 mg total) rectally daily as needed for severe constipation.   chlorhexidine  0.12 % solution Commonly known as: PERIDEX  Use as directed 15 mLs in the mouth or throat in the morning and at bedtime.   diclofenac  Sodium 1 % Gel Commonly known as: VOLTAREN  Apply 4 g topically 4 (four) times daily. What changed: how  much to take   docusate 50 MG/5ML liquid Commonly known as: COLACE Place 100 mg into feeding tube every other day.   esomeprazole 20 MG capsule Commonly known as: NEXIUM 20 mg See admin instructions. Give 1 capsule (20mg ) via tube every other day.   ferrous sulfate 220 (44 Fe) MG/5ML solution Place 330 mg into feeding tube daily.   fluticasone  50 MCG/ACT nasal spray Commonly known as: FLONASE  Place 2 sprays into both nostrils See admin instructions. Administer 2 sprays in both nostrils every 23 hours as needed for allergy signs/symptoms.   free water  Soln Place 55-200 mLs into feeding tube See admin instructions. Flush enteral tube with 200mL of water  every 4 hours - AND - during every shift, flush peg tube with 55mL of water  before and after medications, before initiating feeding or when an interruption of feeding occurs to maintain tube patency.   Isosource 1.5 Cal Liqd Isosource 1.5 @ 39mL/hr; on at 1500 and  off at 0600 the next morning. What changed:  how much to take how to take this when to take this additional instructions   lidocaine  4 % Place 1 patch onto the skin daily at 6 PM. applied to right knee at 1800 and removed at 0600 the next morning.   polyethylene glycol powder 17 GM/SCOOP powder Commonly known as: GLYCOLAX /MIRALAX  Place 17 g into feeding tube in the morning and at bedtime.   Vitamin D3 Liqd Give 5,000 Units by tube every Monday.         Follow ups:    Follow-up Information     [facility].   Specialty: Skilled Nursing Facility Contact information: [address] [place] [address] [phone]                 Discharge Instructions:   Discharge Instructions     Call MD for:  difficulty breathing, headache or visual disturbances   Complete by: As directed    Call MD for:  extreme fatigue   Complete by: As directed    Call MD for:  hives   Complete by: As directed    Call MD for:  persistant dizziness or  light-headedness   Complete by: As directed    Call MD for:  persistant nausea and vomiting   Complete by: As directed    Call MD for:  severe uncontrolled pain   Complete by: As directed    Call MD for:  temperature >100.4   Complete by: As directed    Discharge instructions   Complete by: As directed    Recommendations at discharge:   Please use abdominal binder to secure PEG tube and prevent it from being pulled out again  Per nutritionist recommendation, discharge tube feeding : Isosource 1.5 @ 13ml/hr x 15 hours (1500-0600).  Can continue to feed oral as she was doing before.  General discharge instructions: Follow with Primary MD [facility] in 7 days  Please request your PCP  to go over your hospital tests, procedures, radiology results at the follow up. Please get your medicines reviewed and adjusted.  Your PCP may decide to repeat certain labs or tests as needed. Do not drive, operate heavy machinery, perform activities at heights, swimming or participation in water  activities or provide baby sitting services if your were admitted for syncope or siezures until you have seen by Primary MD or a Neurologist and advised to do so again. [place]  Controlled Substance Reporting System database was reviewed. Do not drive, operate heavy machinery, perform activities at heights, swim, participate in water  activities or provide baby-sitting services while on medications for pain, sleep and mood until your outpatient physician has reevaluated you and advised to do so again.  You are strongly recommended to comply with the dose, frequency and duration of prescribed medications. Activity: As tolerated with Full fall precautions use walker/cane & assistance as needed Avoid using any recreational substances like cigarette, tobacco, alcohol, or non-prescribed drug. If you experience worsening of your admission symptoms, develop shortness of breath, life threatening emergency, suicidal or  homicidal thoughts you must seek medical attention immediately by calling 911 or calling your MD immediately  if symptoms less severe. You must read complete instructions/literature along with all the possible adverse reactions/side effects for all the medicines you take and that have been prescribed to you. Take any new medicine only after you have completely understood and accepted all the possible adverse reactions/side effects.  Wear Seat belts while driving. You were  cared for by a hospitalist during your hospital stay. If you have any questions about your discharge medications or the care you received while you were in the hospital after you are discharged, you can call the unit and ask to speak with the hospitalist or the covering physician. Once you are discharged, your primary care physician will handle any further medical issues. Please note that NO REFILLS for any discharge medications will be authorized once you are discharged, as it is imperative that you return to your primary care physician (or establish a relationship with a primary care physician if you do not have one).   Increase activity slowly   Complete by: As directed        Discharge Exam:   Vitals:   07/26/24 0816 07/26/24 1300 07/26/24 2021 07/27/24 0424  BP: 129/77  114/81 117/75  Pulse: 84  92 94  Resp: 16  18 18   Temp: 98 F (36.7 C)  98.8 F (37.1 C) 98.7 F (37.1 C)  TempSrc: Oral Axillary Oral Oral  SpO2: 97%  100% 98%    There is no height or weight on file to calculate BMI.  General exam: Pleasant, middle-aged African-American female Skin: No rashes, lesions or ulcers. HEENT: Atraumatic, normocephalic, no obvious bleeding Lungs: Clear to auscultation bilaterally,  CVS: S1, S2, no murmur,   GI/Abd: Soft, nontender, nondistended, bowel sound present, PEG tube in place. CNS: Awake.  Unable to communicate.  Patient has deficits from prior stroke.  Contracted extremities Extremities: No pedal edema, no  calf tenderness,    The results of significant diagnostics from this hospitalization (including imaging, microbiology, ancillary and laboratory) are listed below for reference.    Procedures and Diagnostic Studies:   DG Abd Portable 1V Result Date: 07/26/2024 CLINICAL DATA:  14941 Gastrostomy status (HCC) 14941 EXAM: PORTABLE ABDOMEN - 1 VIEW COMPARISON:  KUB, 06/13/2024.  CT AP, 07/25/2024. FINDINGS: Support lines; gastrostomy tube, well-positioned gastrostomy tube with intra-luminal opacification of stomach. The bowel gas pattern is normal. No radio-opaque calculi or other significant radiographic abnormality are seen. IMPRESSION: 1. Well-positioned gastrostomy tube. 2. Non-obstructed bowel gas patten. Electronically Signed   By: Thom Hall M.D.   On: 07/26/2024 10:32   IR REPLACE G-TUBE SIMPLE WO FLUORO Result Date: 07/26/2024 INDICATION: Dislodged PEG tube. Patient with history of prior stroke, altered mental status, chronic indwelling gastrostomy tube with recent dislodgement. Request received for gastrostomy tube replacement. EXAM: REPLACEMENT OF GASTROSTOMY TUBE MEDICATIONS: None ANESTHESIA/SEDATION: None CONTRAST:  None FLUOROSCOPY: None COMPLICATIONS: None immediate. PROCEDURE: Informed written consent was obtained from the patient and/or patient's representative (patient's daughter) after a thorough discussion of the procedural risks, benefits and alternatives. All questions were addressed. A timeout was performed prior to the initiation of the procedure. After cannulating the gastrostomy tract with a guidewire, the tract was dilated over the wire and a 12 French balloon retention gastrostomy tube advanced. The retention balloon was inflated with 3 mL of saline. Tube flushed without difficulty and was secured to skin. IMPRESSION: Successful bedside replacement of dislodged gastrostomy tube for a new 12 Fr balloon-retention catheter. Follow up abdominal film pending. Performed by: Franky Kelsie RIGGERS RECOMMENDATIONS: The patient will return to [facility] ([facility]) for routine feeding tube evaluation and exchange in 6 months. Thom Hall, MD [facility] Electronically Signed   By: Thom Hall M.D.   On: 07/26/2024 10:27   CT ABDOMEN PELVIS W CONTRAST Result Date: 07/25/2024 EXAM: CT ABDOMEN AND PELVIS WITH  CONTRAST 07/25/2024 02:24:46 PM TECHNIQUE: CT of the abdomen and pelvis was performed with the administration of 100 mL iohexol  (OMNIPAQUE ) 300 MG/ML solution. Multiplanar reformatted images are provided for review. Automated exposure control, iterative reconstruction, and/or weight-based adjustment of the mA/kV was utilized to reduce the radiation dose to as low as reasonably achievable. COMPARISON: 04/15/2022, 07/30/2021 CLINICAL HISTORY: Abdominal pain, acute, nonlocalized; G tube dislodged, question of developing abscess. FINDINGS: LOWER CHEST: Subsegmental atelectasis in the right lower lobe. LIVER: Mild diffuse hepatic steatosis. GALLBLADDER AND BILE DUCTS: Unchanged small cyst or focal fatty infiltration along the gallbladder fossa. No biliary ductal dilatation. SPLEEN: No acute abnormality. PANCREAS: No acute abnormality. ADRENAL GLANDS: No acute abnormality. KIDNEYS, URETERS AND BLADDER: Small nonobstructing right upper pole calculus. Scattered subcentimeter hypodensities within both kidneys, too small to definitively characterize, but likely small cysts. Per consensus, no follow-up is needed for simple Bosniak type 1 and 2 renal cysts, unless the patient has a malignancy history or risk factors. No hydronephrosis. No perinephric or periureteral stranding. The urinary bladder is distended without focal abnormality. GI AND BOWEL: Stomach demonstrates no acute abnormality. Scattered colonic diverticulosis. No changes of acute diverticulitis. Normal appendix. Moderate volume fecal loading throughout the colon with  a fairly column of stool in the rectosigmoid colon. There is no bowel obstruction. PERITONEUM AND RETROPERITONEUM: No ascites. No free air. VASCULATURE: Aorta is normal in caliber. LYMPH NODES: No lymphadenopathy. REPRODUCTIVE ORGANS: The uterus and ovaries are within normal limits for patients age. BONES AND SOFT TISSUES: Multilevel thoracic osteophytosis. Severe bilateral hip osteoarthritis or worse on the right than the left. No acute osseous abnormality. No focal soft tissue abnormality. IMPRESSION: 1. No acute findings in the abdomen or pelvis. 2. Moderate volume of fecal loading within the colon, most pronounced within the rectosigmoid colon , which may reflect changes of constipation. 3. Nonobstructive right lower pole calyceal calculus. No hydronephrosis in either kidney. 4. Scattered colonic diverticulosis. Electronically signed by: Rogelia Myers MD MD 07/25/2024 03:10 PM EST RP Workstation: [id number]   DG Chest Port 1 View Result Date: 07/25/2024 CLINICAL DATA:  Sepsis EXAM: PORTABLE CHEST 1 VIEW COMPARISON:  June 13, 2024 FINDINGS: The heart size and mediastinal contours are within normal limits. Lungs are clear. The visualized skeletal structures are unremarkable. IMPRESSION: No active disease. Electronically Signed   By: Lynwood Landy Raddle M.D.   On: 07/25/2024 12:58     Labs:   Basic Metabolic Panel: Recent Labs  Lab 07/25/24 1209 07/25/24 2134 07/26/24 0737 07/27/24 0628  NA 157* 150* 145  --   K 3.6 3.3* 3.3*  --   CL 114* 112* 105  --   CO2 31 28 26   --   GLUCOSE 162* 100* 95  --   BUN 27* 16 11  --   CREATININE 0.54 0.42* 0.45  --   CALCIUM  10.4* 9.5 9.6  --   MG  --  2.1  --  2.3  PHOS  --  3.4  --  3.3   GFR CrCl cannot be calculated (Unknown ideal weight.). Liver Function Tests: Recent Labs  Lab 07/25/24 1209 07/26/24 0737  AST 35 28  ALT 34 25  ALKPHOS 104 93  BILITOT 0.4 0.7  PROT 8.8* 7.9  ALBUMIN 4.6 4.4   No results for input(s): LIPASE,  AMYLASE in the last 168 hours. No results for input(s): AMMONIA in the last 168 hours. Coagulation profile Recent Labs  Lab 07/25/24 1209  INR 0.9    CBC: Recent Labs  Lab 07/25/24 1209 07/26/24 0737  WBC 5.5 4.9  NEUTROABS 3.8  --   HGB 12.1 11.5*  HCT 40.8 38.2  MCV 85.5 83.8  PLT 224 179   Cardiac Enzymes: No results for input(s): CKTOTAL, CKMB, CKMBINDEX, TROPONINI in the last 168 hours. BNP: Invalid input(s): POCBNP CBG: Recent Labs  Lab 07/26/24 2023 07/26/24 2358 07/27/24 0425 07/27/24 0940 07/27/24 1121  GLUCAP 98 111* 131* 130* 128*   D-Dimer No results for input(s): DDIMER in the last 72 hours. Hgb A1c No results for input(s): HGBA1C in the last 72 hours. Lipid Profile No results for input(s): CHOL, HDL, LDLCALC, TRIG, CHOLHDL, LDLDIRECT in the last 72 hours. Thyroid function studies No results for input(s): TSH, T4TOTAL, T3FREE, THYROIDAB in the last 72 hours.  Invalid input(s): FREET3 Anemia work up No results for input(s): VITAMINB12, FOLATE, FERRITIN, TIBC, IRON, RETICCTPCT in the last 72 hours. Microbiology Recent Results (from the past 240 hours)  Blood Culture (routine x 2)     Status: None (Preliminary result)   Collection Time: 07/25/24 11:56 AM   Specimen: BLOOD  Result Value Ref Range Status   Specimen Description   Final    BLOOD RIGHT ANTECUBITAL Performed at [facility], [address], [place], [place] [address]    Special Requests   Final    BOTTLES DRAWN AEROBIC AND ANAEROBIC Blood Culture adequate volume Performed at [facility], [address] [place], [place] [address]    Culture   Final    NO GROWTH 2 DAYS Performed at [facility], [address] [place], [place] [address]    Report Status PENDING  Incomplete  Blood Culture (routine x 2)     Status: None (Preliminary result)   Collection Time: 07/25/24 11:58 AM    Specimen: BLOOD RIGHT HAND  Result Value Ref Range Status   Specimen Description   Final    BLOOD RIGHT HAND Performed at [facility], [address], [place], [place] [address]    Special Requests   Final    BOTTLES DRAWN AEROBIC AND ANAEROBIC Blood Culture adequate volume Performed at [facility], [address] [place], [place] [address]    Culture   Final    NO GROWTH 2 DAYS Performed at [facility], [address] [place], [place] [address]    Report Status PENDING  Incomplete  Resp panel by RT-PCR (RSV, Flu A&B, Covid) Anterior Nasal Swab     Status: None   Collection Time: 07/25/24 12:21 PM   Specimen: Anterior Nasal Swab  Result Value Ref Range Status   SARS Coronavirus 2 by RT PCR NEGATIVE NEGATIVE Final    Comment: (NOTE) SARS-CoV-2 target nucleic acids are NOT DETECTED.  The SARS-CoV-2 RNA is generally detectable in upper respiratory specimens during the acute phase of infection. The lowest concentration of SARS-CoV-2 viral copies this assay can detect is 138 copies/mL. A negative result does not preclude SARS-Cov-2 infection and should not be used as the sole basis for treatment or other patient management decisions. A negative result may occur with  improper specimen collection/handling, submission of specimen other than nasopharyngeal swab, presence of viral mutation(s) within the areas targeted by this assay, and inadequate number of viral copies(<138 copies/mL). A negative result must be combined with clinical observations, patient history, and epidemiological information. The expected result is Negative.  Fact Sheet for Patients:  bloggercourse.com  Fact Sheet for Healthcare Providers:  seriousbroker.it  This test is no t yet approved or cleared by the [place]  FDA and  has been authorized for detection and/or diagnosis of SARS-CoV-2 by FDA under an Emergency Use  Authorization (EUA). This EUA will remain  in effect (meaning this test can be used) for the duration of the COVID-19 declaration under Section 564(b)(1) of the Act, 21 U.S.C.section 360bbb-3(b)(1), unless the authorization is terminated  or revoked sooner.       Influenza A by PCR NEGATIVE NEGATIVE Final   Influenza B by PCR NEGATIVE NEGATIVE Final    Comment: (NOTE) The Xpert Xpress SARS-CoV-2/FLU/RSV plus assay is intended as an aid in the diagnosis of influenza from Nasopharyngeal swab specimens and should not be used as a sole basis for treatment. Nasal washings and aspirates are unacceptable for Xpert Xpress SARS-CoV-2/FLU/RSV testing.  Fact Sheet for Patients: bloggercourse.com  Fact Sheet for Healthcare Providers: seriousbroker.it  This test is not yet approved or cleared by the [place]  FDA and has been authorized for detection and/or diagnosis of SARS-CoV-2 by FDA under an Emergency Use Authorization (EUA). This EUA will remain in effect (meaning this test can be used) for the duration of the COVID-19 declaration under Section 564(b)(1) of the Act, 21 U.S.C. section 360bbb-3(b)(1), unless the authorization is terminated or revoked.     Resp Syncytial Virus by PCR NEGATIVE NEGATIVE Final    Comment: (NOTE) Fact Sheet for Patients: bloggercourse.com  Fact Sheet for Healthcare Providers: seriousbroker.it  This test is not yet approved or cleared by the [place]  FDA and has been authorized for detection and/or diagnosis of SARS-CoV-2 by FDA under an Emergency Use Authorization (EUA). This EUA will remain in effect (meaning this test can be used) for the duration of the COVID-19 declaration under Section 564(b)(1) of the Act, 21 U.S.C. section 360bbb-3(b)(1), unless the authorization is terminated or revoked.  Performed at [facility],  [address] [place], [place] [address]     Time coordinating discharge: 45 minutes  Signed: Chapman Qualyn Oyervides  [facility] 07/27/2024, 11:59 AM   "

## 2024-07-30 LAB — CULTURE, BLOOD (ROUTINE X 2)
Culture: NO GROWTH
Culture: NO GROWTH
Special Requests: ADEQUATE
Special Requests: ADEQUATE

## 2025-01-23 ENCOUNTER — Other Ambulatory Visit (HOSPITAL_COMMUNITY)
